# Patient Record
Sex: Female | Born: 1950 | Race: Black or African American | Hispanic: No | Marital: Single | Smoking: Former smoker
Health system: Southern US, Community
[De-identification: ages and names within clinical notes are randomized; demographics above are authoritative.]

## PROBLEM LIST (undated history)

## (undated) ENCOUNTER — Ambulatory Visit: Admission: EM | Payer: Medicare Other | Source: Home / Self Care

## (undated) DIAGNOSIS — Z8709 Personal history of other diseases of the respiratory system: Secondary | ICD-10-CM

## (undated) DIAGNOSIS — Z96651 Presence of right artificial knee joint: Secondary | ICD-10-CM

## (undated) DIAGNOSIS — I10 Essential (primary) hypertension: Secondary | ICD-10-CM

## (undated) DIAGNOSIS — Z96652 Presence of left artificial knee joint: Secondary | ICD-10-CM

## (undated) DIAGNOSIS — Z96659 Presence of unspecified artificial knee joint: Secondary | ICD-10-CM

## (undated) DIAGNOSIS — I493 Ventricular premature depolarization: Secondary | ICD-10-CM

## (undated) DIAGNOSIS — M797 Fibromyalgia: Secondary | ICD-10-CM

## (undated) DIAGNOSIS — Z9884 Bariatric surgery status: Secondary | ICD-10-CM

## (undated) DIAGNOSIS — K219 Gastro-esophageal reflux disease without esophagitis: Secondary | ICD-10-CM

## (undated) DIAGNOSIS — J309 Allergic rhinitis, unspecified: Secondary | ICD-10-CM

## (undated) DIAGNOSIS — I509 Heart failure, unspecified: Secondary | ICD-10-CM

## (undated) DIAGNOSIS — R079 Chest pain, unspecified: Secondary | ICD-10-CM

## (undated) DIAGNOSIS — R42 Dizziness and giddiness: Secondary | ICD-10-CM

## (undated) DIAGNOSIS — M7052 Other bursitis of knee, left knee: Secondary | ICD-10-CM

## (undated) DIAGNOSIS — M48061 Spinal stenosis, lumbar region without neurogenic claudication: Secondary | ICD-10-CM

## (undated) DIAGNOSIS — Z9109 Other allergy status, other than to drugs and biological substances: Secondary | ICD-10-CM

## (undated) DIAGNOSIS — E785 Hyperlipidemia, unspecified: Secondary | ICD-10-CM

## (undated) DIAGNOSIS — Z6841 Body Mass Index (BMI) 40.0 and over, adult: Secondary | ICD-10-CM

## (undated) DIAGNOSIS — R519 Headache, unspecified: Secondary | ICD-10-CM

## (undated) DIAGNOSIS — T4145XA Adverse effect of unspecified anesthetic, initial encounter: Secondary | ICD-10-CM

## (undated) DIAGNOSIS — I428 Other cardiomyopathies: Secondary | ICD-10-CM

## (undated) DIAGNOSIS — J45909 Unspecified asthma, uncomplicated: Secondary | ICD-10-CM

## (undated) DIAGNOSIS — M1990 Unspecified osteoarthritis, unspecified site: Secondary | ICD-10-CM

## (undated) DIAGNOSIS — K649 Unspecified hemorrhoids: Secondary | ICD-10-CM

## (undated) DIAGNOSIS — J449 Chronic obstructive pulmonary disease, unspecified: Secondary | ICD-10-CM

## (undated) DIAGNOSIS — R200 Anesthesia of skin: Secondary | ICD-10-CM

## (undated) DIAGNOSIS — M1712 Unilateral primary osteoarthritis, left knee: Secondary | ICD-10-CM

## (undated) DIAGNOSIS — H9319 Tinnitus, unspecified ear: Secondary | ICD-10-CM

## (undated) DIAGNOSIS — M1711 Unilateral primary osteoarthritis, right knee: Secondary | ICD-10-CM

## (undated) DIAGNOSIS — T8859XA Other complications of anesthesia, initial encounter: Secondary | ICD-10-CM

## (undated) DIAGNOSIS — G4730 Sleep apnea, unspecified: Secondary | ICD-10-CM

## (undated) HISTORY — DX: Other cardiomyopathies: I42.8

## (undated) HISTORY — PX: COLONOSCOPY: SHX174

## (undated) HISTORY — DX: Presence of left artificial knee joint: Z96.652

## (undated) HISTORY — DX: Heart failure, unspecified: I50.9

## (undated) HISTORY — DX: Presence of unspecified artificial knee joint: Z96.659

## (undated) HISTORY — PX: TONSILLECTOMY: SUR1361

## (undated) HISTORY — DX: Ventricular premature depolarization: I49.3

## (undated) HISTORY — DX: Chest pain, unspecified: R07.9

## (undated) HISTORY — PX: FRACTURE SURGERY: SHX138

## (undated) HISTORY — PX: GANGLION CYST EXCISION: SHX1691

## (undated) HISTORY — DX: Unilateral primary osteoarthritis, right knee: M17.11

## (undated) HISTORY — PX: OTHER SURGICAL HISTORY: SHX169

## (undated) HISTORY — DX: Body Mass Index (BMI) 40.0 and over, adult: Z684

## (undated) HISTORY — PX: NECK SURGERY: SHX720

## (undated) HISTORY — PX: GASTRIC BYPASS OPEN: SUR638

## (undated) HISTORY — DX: Other bursitis of knee, left knee: M70.52

## (undated) HISTORY — PX: COLON SURGERY: SHX602

## (undated) HISTORY — DX: Spinal stenosis, lumbar region without neurogenic claudication: M48.061

## (undated) HISTORY — DX: Hyperlipidemia, unspecified: E78.5

## (undated) HISTORY — DX: Morbid (severe) obesity due to excess calories: E66.01

## (undated) HISTORY — PX: ABDOMINAL HYSTERECTOMY: SHX81

## (undated) HISTORY — DX: Unilateral primary osteoarthritis, left knee: M17.12

## (undated) HISTORY — PX: KNEE SURGERY: SHX244

## (undated) HISTORY — DX: Presence of right artificial knee joint: Z96.651

## (undated) HISTORY — PX: CHOLECYSTECTOMY: SHX55

---

## 1977-01-12 HISTORY — PX: ABDOMINAL HYSTERECTOMY: SUR658

## 1978-01-12 HISTORY — PX: GASTRIC BYPASS: SHX52

## 2000-03-16 ENCOUNTER — Ambulatory Visit (HOSPITAL_BASED_OUTPATIENT_CLINIC_OR_DEPARTMENT_OTHER): Admission: RE | Admit: 2000-03-16 | Discharge: 2000-03-16 | Payer: Self-pay | Admitting: Orthopaedic Surgery

## 2006-11-28 ENCOUNTER — Ambulatory Visit: Payer: Self-pay | Admitting: Internal Medicine

## 2006-12-30 ENCOUNTER — Ambulatory Visit: Payer: Self-pay | Admitting: Family Medicine

## 2007-03-22 ENCOUNTER — Ambulatory Visit (HOSPITAL_COMMUNITY): Admission: RE | Admit: 2007-03-22 | Discharge: 2007-03-22 | Payer: Self-pay | Admitting: Orthopaedic Surgery

## 2007-07-12 ENCOUNTER — Ambulatory Visit: Payer: Self-pay | Admitting: Gastroenterology

## 2007-08-11 ENCOUNTER — Ambulatory Visit: Payer: Self-pay | Admitting: Gastroenterology

## 2007-10-27 ENCOUNTER — Ambulatory Visit: Payer: Self-pay | Admitting: Internal Medicine

## 2008-02-07 ENCOUNTER — Ambulatory Visit: Payer: Self-pay | Admitting: Family Medicine

## 2009-02-22 ENCOUNTER — Ambulatory Visit: Payer: Self-pay | Admitting: Internal Medicine

## 2009-05-16 ENCOUNTER — Ambulatory Visit: Payer: Self-pay | Admitting: Internal Medicine

## 2010-02-17 ENCOUNTER — Ambulatory Visit: Payer: Self-pay | Admitting: Family Medicine

## 2010-05-27 NOTE — Op Note (Signed)
NAME:  Wendy Sanchez, Wendy Sanchez               ACCOUNT NO.:  [id number]   MEDICAL RECORD NO.:  [id number]          PATIENT TYPE:  AMB   LOCATION:  SDS                          FACILITY:  [facility]   PHYSICIAN:  Lubertha Basque. Dalldorf, M.D.DATE OF BIRTH:  [birth year]   DATE OF PROCEDURE:  03/22/2007  DATE OF DISCHARGE:                               OPERATIVE REPORT   PREOPERATIVE DIAGNOSES:  1. Right shoulder impingement.  2. Right shoulder rotator cuff tear.   POSTOPERATIVE DIAGNOSES:  1. Right shoulder impingement.  2. Right shoulder rotator cuff tear.   PROCEDURE:  1. Right shoulder arthroscopic acromioplasty.  2. Right shoulder partial clavicectomy.  3. Right shoulder arthroscopic rotator cuff repair.   ANESTHESIA:  General.   ATTENDING SURGEON:  Lubertha Basque. Jerl Santos, M.D.   ASSISTANT:  Lindwood Qua, P.A.   INDICATIONS FOR PROCEDURE:  The patient is a 60 year old woman whom I  have followed for several years with intense right shoulder pain.  This  has persisted despite oral anti-inflammatories, exercise program and  several injections.  Based on MRI scan which is now two years old she  has a minimally retracted rotator cuff tear.  She has pain which limits  her ability to use her arm and to rest and she is offered an arthroscopy  with repair.  Informed operative consent was obtained after discussion  of possible complications of reaction to anesthesia and infection.   SUMMARY OF FINDINGS AND PROCEDURE:  Under general anesthesia an  arthroscopy of the right shoulder was performed.  Glenohumeral joint  showed no degenerative changes and the biceps tendon was intact.  She  had a large rotator cuff tear seen from below.  In the subacromial space  her large cuff tear was confirmed and measured about 1.5 cm in size  though it was minimally retracted.  She did have a prominent subacromial  morphology addressed with acromioplasty and also had a prominence of the  distal AC joint addressed with a  partial clavicectomy.  We then repaired  the rotator cuff to bleeding bed of bone using two of the push lock  anchors by Arthrex using reverse mattress sutures of #2 FiberWire with  each.  [name] assisted throughout and was invaluable to the  completion of the case in that he helped position and retract and pass  instruments while I performed procedure.   DESCRIPTION OF PROCEDURE:  The patient was taken to an operating suite  where general anesthetic applied without difficulty.  Main OR was  utilized due to size of the patient.  She was positioned in beach-chair  position and prepped and draped in normal sterile fashion.  Great care  was taken with positioning and padding reflective of the size of the  patient.  After administration of preop IV Kefzol, an arthroscopy right  shoulder was performed through a total of three portals.  Findings were  as noted above and procedure consisted of acromioplasty and partial  clavicectomy done with the bur in the lateral position followed by  transfer of the bur to the posterior position.  Then debrided her cuff  and  the tissues did appear to be fairly good.  I created bleeding bed of  bone with the bur directly underneath this minimally retracted tear.  We  then passed two mattress sutures of #2 FiberWire with the scorpion  device.  I then stabilized the cuff to the bone using these sutures and  the large push locks by Arthrex.  This seemed to reapproximate things  very well with a nice stable repair.  The shoulder was thoroughly  irrigated followed by placement Marcaine with epinephrine morphine.  Simple sutures of nylon was used to loosely reapproximate the portals  followed by Adaptic and dry gauze dressing with tape.  Estimated blood  and intraoperative fluids can be obtained from anesthesia records.   DISPOSITION:  The patient was extubated in the operating room and taken  to recovery room in stable addition.  She was to go home the same  day  and follow up in the office in less than a week.  I will contact her by  phone tonight.      Lubertha Basque Jerl Santos, M.D.  Electronically Signed     PGD/MEDQ  D:  03/22/2007  T:  03/23/2007  Job:  [id number]

## 2010-05-30 NOTE — Op Note (Signed)
[place]  Patient:    ALA, Wendy Sanchez                      MRN: [id number] Proc. Date: 03/16/00 Adm. Date:  [id number] Attending:  Marcene Corning                           Operative Report  PREOPERATIVE DIAGNOSES: 1. Torn medial and torn lateral meniscus. 2. Degenerative joint disease.  POSTOPERATIVE DIAGNOSES: 1. Torn medial and torn lateral meniscus. 2. Degenerative joint disease.  PROCEDURES: 1. Right knee partial medial and partial lateral meniscectomies. 2. Right knee chondroplasty, patellofemoral joint.  ANESTHESIA:  General.  SURGEON:  Lubertha Basque. Jerl Santos, M.D.  ASSISTANT:  Prince Rome, P.Sanchez.  INDICATION FOR PROCEDURE:  The patient is Sanchez 60 year old woman with many months of significant left knee pain since Sanchez work-related injury.  This has persisted despite oral anti-inflammatories and rest.  At this point she is offered an arthroscopy.  The procedure was discussed with the patient, and informed operative consent was obtained after discussion of possible complications of, reaction to anesthesia, and infection.  DESCRIPTION OF PROCEDURE:  The patient was taken to the operating suite, where Sanchez general anesthetic was applied without difficulty.  She was positioned supine and prepped and draped in the normal sterile fashion.  We used Sanchez Hibiclens prep, as she expressed an allergy to iodine.  She was then draped in standard fashion.  After administration of preoperative IV antibiotics, an arthroscopy of the left knee was performed through two inferior portals.  The suprapatellar pouch was benign, while the patellofemoral joint for the most part was benign except for Sanchez grade 4 area of change in the medial aspect of the groove.  This required Sanchez thorough chondroplasty of this portion of the knee.  In the medial compartment, she had Sanchez degenerative tear of the middle horn, which was addressed with Sanchez partial medial meniscectomy, taking  about 10% of this structure back to Sanchez stable rim.  She had some grade 2 change on the femoral condyle.  ACL and PCL were intact.  In the lateral compartment, she had again Sanchez degenerative tear involving at this time the posterior horn, and this was addressed with Sanchez partial lateral meniscectomy, taking about 5% of this structure back to Sanchez stable rim.  There were again just grade 2 changes in the lateral compartment.  The knee was thoroughly irrigated at the end of the case, followed by the placement of Marcaine with epinephrine and morphine. Adaptic was placed over the two portals, followed by dry gauze and Sanchez loose Ace wrap.  Estimated blood loss and intraoperative fluids can be obtained from anesthesia records.  DISPOSITION:  The patient was extubated in the operating room and taken to the recovery room in stable condition.  Plans were for her to go home the same day and to follow up in our office in less than Sanchez week.  I will contact her by phone tonight. DD:  03/16/00 TD:  03/16/00 Job: [id number]

## 2010-10-06 LAB — CBC
HCT: 37.7
Hemoglobin: 12.3
MCV: 85.2
Platelets: 348
RBC: 4.43
WBC: 5.1

## 2010-10-06 LAB — BASIC METABOLIC PANEL
Chloride: 105
GFR calc non Af Amer: >60
Potassium: 3.7
Sodium: 138

## 2010-10-06 LAB — LIPID PANEL
HDL: 71
Total CHOL/HDL Ratio: 3.3
Triglycerides: 76
VLDL: 15

## 2011-03-26 ENCOUNTER — Ambulatory Visit: Payer: Self-pay | Admitting: Family Medicine

## 2012-10-03 ENCOUNTER — Ambulatory Visit: Payer: Self-pay | Admitting: Family Medicine

## 2013-02-23 ENCOUNTER — Ambulatory Visit: Payer: Self-pay | Admitting: Family Medicine

## 2013-04-18 ENCOUNTER — Ambulatory Visit: Payer: Self-pay | Admitting: Gastroenterology

## 2013-04-18 LAB — CLOSTRIDIUM DIFFICILE(ARMC)

## 2013-04-20 LAB — PATHOLOGY REPORT

## 2013-04-20 LAB — STOOL CULTURE

## 2013-04-28 ENCOUNTER — Other Ambulatory Visit: Payer: Self-pay | Admitting: Gastroenterology

## 2013-04-28 LAB — CLOSTRIDIUM DIFFICILE(ARMC)

## 2013-04-30 LAB — STOOL CULTURE

## 2013-05-30 ENCOUNTER — Ambulatory Visit: Payer: Self-pay | Admitting: Gastroenterology

## 2013-07-12 ENCOUNTER — Ambulatory Visit: Payer: Self-pay | Admitting: Rheumatology

## 2013-08-08 ENCOUNTER — Ambulatory Visit: Payer: Self-pay | Admitting: Rheumatology

## 2013-08-28 ENCOUNTER — Other Ambulatory Visit (HOSPITAL_BASED_OUTPATIENT_CLINIC_OR_DEPARTMENT_OTHER): Payer: Self-pay | Admitting: Rheumatology

## 2013-08-28 DIAGNOSIS — C642 Malignant neoplasm of left kidney, except renal pelvis: Secondary | ICD-10-CM

## 2013-08-30 ENCOUNTER — Other Ambulatory Visit: Payer: Self-pay

## 2013-09-02 ENCOUNTER — Other Ambulatory Visit (HOSPITAL_BASED_OUTPATIENT_CLINIC_OR_DEPARTMENT_OTHER): Payer: Medicare Other

## 2013-09-02 ENCOUNTER — Ambulatory Visit (HOSPITAL_BASED_OUTPATIENT_CLINIC_OR_DEPARTMENT_OTHER)
Admission: RE | Admit: 2013-09-02 | Discharge: 2013-09-02 | Disposition: A | Discharge status: Home / Self Care | Payer: Medicare Other | Source: Ambulatory Visit | Attending: Rheumatology | Admitting: Rheumatology

## 2013-09-02 DIAGNOSIS — Q619 Cystic kidney disease, unspecified: Secondary | ICD-10-CM | POA: Insufficient documentation

## 2013-09-02 DIAGNOSIS — C649 Malignant neoplasm of unspecified kidney, except renal pelvis: Secondary | ICD-10-CM | POA: Insufficient documentation

## 2013-09-02 DIAGNOSIS — C642 Malignant neoplasm of left kidney, except renal pelvis: Secondary | ICD-10-CM

## 2013-09-02 MED ORDER — GADOBENATE DIMEGLUMINE 529 MG/ML IV SOLN: 20.0000 mL | Freq: Once | INTRAVENOUS | Status: AC | PRN

## 2013-10-13 ENCOUNTER — Ambulatory Visit: Payer: Self-pay | Admitting: Family Medicine

## 2013-11-16 ENCOUNTER — Ambulatory Visit: Payer: Self-pay | Admitting: Gastroenterology

## 2013-11-16 LAB — CLOSTRIDIUM DIFFICILE(ARMC)

## 2013-11-18 LAB — STOOL CULTURE

## 2014-06-10 ENCOUNTER — Ambulatory Visit
Admission: EM | Admit: 2014-06-10 | Discharge: 2014-06-10 | Disposition: A | Discharge status: Home / Self Care | Payer: Medicare Other | Attending: Family Medicine | Admitting: Family Medicine

## 2014-06-10 DIAGNOSIS — J209 Acute bronchitis, unspecified: Secondary | ICD-10-CM | POA: Diagnosis not present

## 2014-06-10 DIAGNOSIS — I10 Essential (primary) hypertension: Secondary | ICD-10-CM | POA: Diagnosis not present

## 2014-06-10 DIAGNOSIS — R3915 Urgency of urination: Secondary | ICD-10-CM | POA: Diagnosis present

## 2014-06-10 DIAGNOSIS — S39011A Strain of muscle, fascia and tendon of abdomen, initial encounter: Secondary | ICD-10-CM | POA: Diagnosis not present

## 2014-06-10 DIAGNOSIS — K219 Gastro-esophageal reflux disease without esophagitis: Secondary | ICD-10-CM | POA: Diagnosis not present

## 2014-06-10 HISTORY — DX: Gastro-esophageal reflux disease without esophagitis: K21.9

## 2014-06-10 HISTORY — DX: Essential (primary) hypertension: I10

## 2014-06-10 HISTORY — DX: Allergic rhinitis, unspecified: J30.9

## 2014-06-10 HISTORY — DX: Unspecified osteoarthritis, unspecified site: M19.90

## 2014-06-10 LAB — URINALYSIS COMPLETE WITH MICROSCOPIC (ARMC ONLY)
BACTERIA UA: NONE SEEN — AB
BILIRUBIN URINE: NEGATIVE
Glucose, UA: NEGATIVE mg/dL
KETONES UR: NEGATIVE mg/dL
LEUKOCYTES UA: NEGATIVE
NITRITE: NEGATIVE
PROTEIN: NEGATIVE mg/dL
Specific Gravity, Urine: 1.015 (ref 1.005–1.030)
pH: 6.5 (ref 5.0–8.0)

## 2014-06-10 MED ORDER — AZITHROMYCIN 250 MG PO TABS: ORAL_TABLET | ORAL | Status: DC

## 2014-06-10 MED ORDER — FLUCONAZOLE 150 MG PO TABS: 150.0000 mg | ORAL_TABLET | Freq: Every day | ORAL | Status: DC

## 2014-06-10 NOTE — ED Provider Notes (Signed)
CSN: [id number]     Arrival date & time 06/10/14  4098 History   First MD Initiated Contact with Patient 06/10/14 1141     Chief Complaint  Patient presents with  . Urinary Urgency   (Consider location/radiation/quality/duration/timing/severity/associated sxs/prior Treatment) HPI Comments: 64 yo with a 2-3 days h/o left lower quadrant abdominal pain, urinary frequency and urgency. Denies any fevers, chills, vomiting, diarrhea. Also complains of a cough and slight wheezing.   The history is provided by the patient.    Past Medical History  Diagnosis Date  . Hypertension   . GERD (gastroesophageal reflux disease)   . Arthritis   . Allergic rhinitis    Past Surgical History  Procedure Laterality Date  . Abdominal hysterectomy  1979    left ovary remains  . Ganglion cyst excision     Family History  Problem Relation Age of Onset  . Hypertension Father   . Dementia Mother   . Deep vein thrombosis Father   . Diabetes Sister   . Congestive Heart Failure Sister   . Congestive Heart Failure Brother   . Congestive Heart Failure Daughter   . Congestive Heart Failure Son    History  Substance Use Topics  . Smoking status: Current Every Day Smoker -- 0.50 packs/day for 30 years    Types: Cigarettes  . Smokeless tobacco: Not on file  . Alcohol Use: No   OB History    No data available     Review of Systems  Allergies  Iodine  Home Medications   Prior to Admission medications   Medication Sig Start Date End Date Taking? Authorizing Provider  azelastine (ASTELIN) 0.1 % nasal spray Place into both nostrils 2 (two) times daily. Use in each nostril as directed   Yes Historical Provider, MD  cholecalciferol (VITAMIN D) 1000 UNITS tablet Take 1,000 Units by mouth daily.   Yes Historical Provider, MD  dexlansoprazole (DEXILANT) 60 MG capsule Take 60 mg by mouth daily.   Yes Historical Provider, MD  diclofenac sodium (VOLTAREN) 1 % GEL Apply topically 4 (four) times daily.   Yes  Historical Provider, MD  fexofenadine (ALLEGRA) 180 MG tablet Take 180 mg by mouth daily.   Yes Historical Provider, MD  Fluticasone-Salmeterol (ADVAIR) 250-50 MCG/DOSE AEPB Inhale 1 puff into the lungs 2 (two) times daily.   Yes Historical Provider, MD  folic acid (FOLVITE) 1 MG tablet Take 1 mg by mouth daily.   Yes Historical Provider, MD  furosemide (LASIX) 40 MG tablet Take 40 mg by mouth.   Yes Historical Provider, MD  ibuprofen (ADVIL,MOTRIN) 800 MG tablet Take 800 mg by mouth every 8 (eight) hours as needed.   Yes Historical Provider, MD  mirabegron ER (MYRBETRIQ) 50 MG TB24 tablet Take 50 mg by mouth daily.   Yes Historical Provider, MD  montelukast (SINGULAIR) 10 MG tablet Take 10 mg by mouth at bedtime.   Yes Historical Provider, MD  oxycodone-acetaminophen (LYNOX) 5-300 MG per tablet Take 1 tablet by mouth every 8 (eight) hours as needed for pain.   Yes Historical Provider, MD  potassium chloride SA (K-DUR,KLOR-CON) 20 MEQ tablet Take 20 mEq by mouth 2 (two) times daily.   Yes Historical Provider, MD  ramipril (ALTACE) 10 MG capsule Take 10 mg by mouth 2 (two) times daily.   Yes Historical Provider, MD  sucralfate (CARAFATE) 1 G tablet Take 1 g by mouth 2 (two) times daily.   Yes Historical Provider, MD  traMADol (ULTRAM) 50 MG tablet  Take 50 mg by mouth every 6 (six) hours as needed.   Yes Historical Provider, MD  azithromycin (ZITHROMAX) 250 MG tablet Take first 2 tablets together, then 1 every day until finished. 06/10/14   Norval Gable, MD  fluconazole (DIFLUCAN) 150 MG tablet Take 1 tablet (150 mg total) by mouth daily. 06/10/14   Norval Gable, MD   BP 160/93 mmHg  Pulse 78  Temp(Src) 98.3 F (36.8 C) (Oral)  Resp 18  Ht 5\' 8"  (1.727 m)  Wt 319 lb (144.697 kg)  BMI 48.51 kg/m2  SpO2 100% Physical Exam  Constitutional: She appears well-developed and well-nourished. No distress.  HENT:  Head: Normocephalic.  Right Ear: Tympanic membrane, external ear and ear canal normal.   Left Ear: Tympanic membrane, external ear and ear canal normal.  Nose: Nose normal.  Mouth/Throat: Oropharynx is clear and moist and mucous membranes are normal.  Eyes: Conjunctivae and EOM are normal. Pupils are equal, round, and reactive to light. Right eye exhibits no discharge. Left eye exhibits no discharge. No scleral icterus.  Neck: Normal range of motion. Neck supple. No JVD present. No tracheal deviation present. No thyromegaly present.  Cardiovascular: Normal rate, regular rhythm, normal heart sounds and intact distal pulses.   No murmur heard. Pulmonary/Chest: Effort normal. No stridor. No respiratory distress. She has wheezes (mild diffuse expiratory wheezes and rhonchi). She has no rales. She exhibits no tenderness.  Abdominal: Soft. Bowel sounds are normal. She exhibits no distension and no mass. There is tenderness (mild left groin pinpoint tenderness to palpation). There is no rebound and no guarding.  Musculoskeletal: She exhibits no edema or tenderness.  Lymphadenopathy:    She has no cervical adenopathy.  Neurological: She is alert.  Skin: Skin is warm. No rash noted. She is not diaphoretic.  Vitals reviewed.   ED Course  Procedures (including critical care time) Labs Review Labs Reviewed  URINALYSIS COMPLETEWITH MICROSCOPIC ([place]) - Abnormal; Notable for the following:    Color, Urine STRAW (*)    Hgb urine dipstick TRACE (*)    Bacteria, UA NONE SEEN (*)    Squamous Epithelial / LPF 0-5 (*)    All other components within normal limits  URINE CULTURE    Imaging Review No results found.   MDM   1. Bronchospasm with bronchitis, acute   2. Abdominal muscle strain, initial encounter    Discharge Medication List as of 06/10/2014 12:01 PM    START taking these medications   Details  azithromycin (ZITHROMAX) 250 MG tablet Take first 2 tablets together, then 1 every day until finished., Normal    fluconazole (DIFLUCAN) 150 MG tablet Take 1 tablet (150  mg total) by mouth daily., Starting 06/10/2014, Until Discontinued, Normal      Plan: 1. Diagnosis reviewed with patient 2. rx as per orders; risks, benefits, potential side effects reviewed with patient 3. Recommend supportive treatment with increased fluids, otc anagesics 4. F/u prn if symptoms worsen or don't improve    Norval Gable, MD 06/12/14 [phone]

## 2014-06-10 NOTE — ED Notes (Signed)
Patient states that she has lower abdominal pain with Urinary Urgency and frequency. She states that pain is worse when she tries to hold her urine. She states that she also has a cough.

## 2014-06-11 LAB — URINE CULTURE: SPECIAL REQUESTS: NORMAL

## 2014-06-23 ENCOUNTER — Ambulatory Visit
Admission: EM | Admit: 2014-06-23 | Discharge: 2014-06-23 | Disposition: A | Discharge status: Home / Self Care | Payer: Medicare Other | Attending: Emergency Medicine | Admitting: Emergency Medicine

## 2014-06-23 ENCOUNTER — Other Ambulatory Visit: Payer: Self-pay

## 2014-06-23 DIAGNOSIS — K219 Gastro-esophageal reflux disease without esophagitis: Secondary | ICD-10-CM | POA: Insufficient documentation

## 2014-06-23 DIAGNOSIS — F17210 Nicotine dependence, cigarettes, uncomplicated: Secondary | ICD-10-CM | POA: Diagnosis not present

## 2014-06-23 DIAGNOSIS — I10 Essential (primary) hypertension: Secondary | ICD-10-CM | POA: Insufficient documentation

## 2014-06-23 DIAGNOSIS — J0100 Acute maxillary sinusitis, unspecified: Secondary | ICD-10-CM | POA: Diagnosis not present

## 2014-06-23 DIAGNOSIS — R42 Dizziness and giddiness: Secondary | ICD-10-CM | POA: Diagnosis not present

## 2014-06-23 MED ORDER — MECLIZINE HCL 25 MG PO TABS: 25.0000 mg | ORAL_TABLET | Freq: Four times a day (QID) | ORAL | Status: DC

## 2014-06-23 MED ORDER — AMOXICILLIN-POT CLAVULANATE 875-125 MG PO TABS: 1.0000 | ORAL_TABLET | Freq: Two times a day (BID) | ORAL | Status: DC

## 2014-06-23 NOTE — ED Notes (Addendum)
Tenderness where ganglion cysts removal last week. Speech WNL. Coughing in triage and reports chest feels tight. Grips equal. No palmar drift, DPF strong and equal. No facial asymmetry. PERRL.

## 2014-06-23 NOTE — ED Provider Notes (Addendum)
HPI  SUBJECTIVE:  Wendy Sanchez is a 64 y.o. female who presents with 4 episodes of dizziness described as vertigo starting last night. States that last seconds to minutes. She states it happens when she turns her head lying down, with large positional changes. It is better when she shuts her eyes. She has not tried anything for this. She was recently treated for an upper respiratory infection with azithromycin. States that she is still having some coughing and wheezing, no chest pain or shortness of breath. She also reports some ear fullness/ear pain. Denies tinnitus, change in hearing. She denies visual changes, arm or leg weakness, dysarthria, extremity numbness or tingling, facial droop, headache. Past medical history hypertension, arthritis, asthma, no past medical history diabetes, stroke, arrhythmia.   Patient also reports continuing nasal congestion, sinus pain/pressure that has been present for over 3 weeks. States that it did not respond to the azithromycin. She takes Astelin and Allegra on a regular basis.     Past Medical History  Diagnosis Date  . Hypertension   . GERD (gastroesophageal reflux disease)   . Arthritis   . Allergic rhinitis     Past Surgical History  Procedure Laterality Date  . Abdominal hysterectomy  1979    left ovary remains  . Ganglion cyst excision      Family History  Problem Relation Age of Onset  . Hypertension Father   . Dementia Mother   . Deep vein thrombosis Father   . Diabetes Sister   . Congestive Heart Failure Sister   . Congestive Heart Failure Brother   . Congestive Heart Failure Daughter   . Congestive Heart Failure Son     History  Substance Use Topics  . Smoking status: Current Every Day Smoker -- 0.50 packs/day for 30 years    Types: Cigarettes  . Smokeless tobacco: Not on file  . Alcohol Use: No    No current facility-administered medications for this encounter.  Current outpatient prescriptions:  .  azelastine  (ASTELIN) 0.1 % nasal spray, Place into both nostrils 2 (two) times daily. Use in each nostril as directed, Disp: , Rfl:  .  cholecalciferol (VITAMIN D) 1000 UNITS tablet, Take 1,000 Units by mouth daily., Disp: , Rfl:  .  dexlansoprazole (DEXILANT) 60 MG capsule, Take 60 mg by mouth daily., Disp: , Rfl:  .  diclofenac sodium (VOLTAREN) 1 % GEL, Apply topically 4 (four) times daily., Disp: , Rfl:  .  fluconazole (DIFLUCAN) 150 MG tablet, Take 1 tablet (150 mg total) by mouth daily., Disp: 1 tablet, Rfl: 2 .  Fluticasone-Salmeterol (ADVAIR) 250-50 MCG/DOSE AEPB, Inhale 1 puff into the lungs 2 (two) times daily., Disp: , Rfl:  .  folic acid (FOLVITE) 1 MG tablet, Take 1 mg by mouth daily., Disp: , Rfl:  .  furosemide (LASIX) 40 MG tablet, Take 40 mg by mouth., Disp: , Rfl:  .  ibuprofen (ADVIL,MOTRIN) 800 MG tablet, Take 800 mg by mouth every 8 (eight) hours as needed., Disp: , Rfl:  .  mirabegron ER (MYRBETRIQ) 50 MG TB24 tablet, Take 50 mg by mouth daily., Disp: , Rfl:  .  montelukast (SINGULAIR) 10 MG tablet, Take 10 mg by mouth at bedtime., Disp: , Rfl:  .  oxycodone-acetaminophen (LYNOX) 5-300 MG per tablet, Take 1 tablet by mouth every 8 (eight) hours as needed for pain., Disp: , Rfl:  .  potassium chloride SA (K-DUR,KLOR-CON) 20 MEQ tablet, Take 20 mEq by mouth 2 (two) times daily., Disp: ,  Rfl:  .  ramipril (ALTACE) 10 MG capsule, Take 10 mg by mouth 2 (two) times daily., Disp: , Rfl:  .  sucralfate (CARAFATE) 1 G tablet, Take 1 g by mouth 2 (two) times daily., Disp: , Rfl:  .  traMADol (ULTRAM) 50 MG tablet, Take 50 mg by mouth every 6 (six) hours as needed., Disp: , Rfl:  .  amoxicillin-clavulanate (AUGMENTIN) 875-125 MG per tablet, Take 1 tablet by mouth 2 (two) times daily. X 7 days, Disp: 14 tablet, Rfl: 0 .  meclizine (ANTIVERT) 25 MG tablet, Take 1 tablet (25 mg total) by mouth 4 (four) times daily., Disp: 28 tablet, Rfl: 0 .  [DISCONTINUED] fexofenadine (ALLEGRA) 180 MG tablet, Take  180 mg by mouth daily., Disp: , Rfl:   Allergies  Allergen Reactions  . Iodine      ROS  As noted in HPI.   Physical Exam  BP 151/94 mmHg  Pulse 86  Temp(Src) 98.4 F (36.9 C) (Oral)  Ht 5\' 8"  (1.727 m)  Wt 320 lb (145.151 kg)  BMI 48.67 kg/m2  SpO2 100%  Constitutional: Well developed, well nourished, no acute distress Eyes: PERRL, EOMI, conjunctiva normal bilaterally HENT: Normocephalic, atraumatic,mucus membranes moist. Mild nasal congestion. Bilateral maxillary sinus tenderness. Small amount of air fluid levels behind the right TM. Otherwise normal color no dullness no redness no bulging. Hearing normal bilaterally. Respiratory: Good inspiratory effort good air movement occasional wheezing, no rales, no rhonchi Cardiovascular: Normal rate and rhythm, no murmurs, no gallops, no rubs. No carotid bruit GI: Soft, nondistended, normal bowel sounds, nontender, no rebound, no guarding Back: no CVAT skin: No rash, skin intact Musculoskeletal: No edema, no tenderness, no deformities Neurologic: Alert & oriented x 3, CN II-XII intact, finger-nose, heel shin within normal limits tendon gait steady Romberg negative. Dix-Hallpike negative, no nystagmus. Height negative bilaterally. Psychiatric: Speech and behavior appropriate   ED Course   Medications - No data to display  Orders Placed This Encounter  Procedures  . ED EKG    Standing Status: Standing     Number of Occurrences: 1     Standing Expiration Date:     Order Specific Question:  Reason for Exam    Answer:  Chest Pain   No results found for this or any previous visit (from the past 24 hour(s)). No results found.  ED Clinical Impression  Vertigo  Acute maxillary sinusitis, recurrence not specified  ED Assessment/Plan  EKG: Normal sinus rhythm, rate 78, normal axis, normal intervals. LVH. Isolated Q-wave in lead 3. No ST elevation or other ST-T wave changes. No previous EKG for comparison  story is most  consistent with a peripheral vertigo, although was unable to elicit symptoms on exam. exam also consistent with a sinusitis.. Patient does not appear dehydrated, has had no nausea vomiting or other reason to be dehydrated. No evidence of stroke on H&P. Patient does have some wheezing, but she has a history of asthma, suspect that this wheezing and coughing is left over from the recent URI/bronchitis. She is not using her albuterol or Advair on a regular basis advised her to restart this to help with the cough.  We'll treat the peripheral vertigo with meclizine gave patient information on the Epley maneuver, will and also treat the sinusitis with some Augmentin. We'll have her continue Astelin and advised her to start taking regular Mucinex. Augmentin will also treat any serous otitis. Patient to follow up with primary care physician in several days. Had extensive discussion  of signs and symptoms that prompt return to the ER. Patient agrees with plan.  Discussed sn/sx that should prompt return to the UC or ED. Patient agrees with plan.  *This clinic note was created using Dragon dictation software. Therefore, there may be occasional mistakes despite careful proofreading.  ?   Melynda Ripple, MD 06/23/14 1714  Melynda Ripple, MD 06/23/14 1718

## 2014-06-23 NOTE — Discharge Instructions (Signed)
Stop the Allegra. Start some regular Mucinex. Continue the Astelin. Continue saline nasal irrigation. If her vertigo comes back, he may try the meclizine. There is also something called Epley maneuver which will also treat vertigo. Go to the ER for any signs of stroke, such as trouble talking, trouble walking, arm or leg weakness.

## 2014-09-13 ENCOUNTER — Other Ambulatory Visit: Payer: Self-pay | Admitting: Family Medicine

## 2014-09-13 DIAGNOSIS — Z1231 Encounter for screening mammogram for malignant neoplasm of breast: Secondary | ICD-10-CM

## 2014-09-28 ENCOUNTER — Other Ambulatory Visit: Payer: Self-pay | Admitting: Otolaryngology

## 2014-09-28 DIAGNOSIS — M5481 Occipital neuralgia: Secondary | ICD-10-CM

## 2014-09-28 DIAGNOSIS — R42 Dizziness and giddiness: Secondary | ICD-10-CM

## 2014-10-09 ENCOUNTER — Ambulatory Visit: Payer: Medicare Other | Attending: Anesthesiology | Admitting: Anesthesiology

## 2014-10-09 ENCOUNTER — Encounter: Payer: Self-pay | Admitting: Anesthesiology

## 2014-10-09 VITALS — BP 128/73 | HR 85 | Temp 98.6°F | Resp 16 | Ht 68.0 in | Wt 320.0 lb

## 2014-10-09 DIAGNOSIS — Z9884 Bariatric surgery status: Secondary | ICD-10-CM | POA: Diagnosis not present

## 2014-10-09 DIAGNOSIS — G4730 Sleep apnea, unspecified: Secondary | ICD-10-CM | POA: Insufficient documentation

## 2014-10-09 DIAGNOSIS — M1990 Unspecified osteoarthritis, unspecified site: Secondary | ICD-10-CM | POA: Diagnosis not present

## 2014-10-09 DIAGNOSIS — J45909 Unspecified asthma, uncomplicated: Secondary | ICD-10-CM | POA: Diagnosis not present

## 2014-10-09 DIAGNOSIS — M1712 Unilateral primary osteoarthritis, left knee: Secondary | ICD-10-CM

## 2014-10-09 DIAGNOSIS — R52 Pain, unspecified: Secondary | ICD-10-CM | POA: Insufficient documentation

## 2014-10-09 DIAGNOSIS — R1084 Generalized abdominal pain: Secondary | ICD-10-CM | POA: Insufficient documentation

## 2014-10-09 DIAGNOSIS — E669 Obesity, unspecified: Secondary | ICD-10-CM | POA: Insufficient documentation

## 2014-10-09 DIAGNOSIS — E785 Hyperlipidemia, unspecified: Secondary | ICD-10-CM | POA: Insufficient documentation

## 2014-10-09 DIAGNOSIS — F17210 Nicotine dependence, cigarettes, uncomplicated: Secondary | ICD-10-CM | POA: Diagnosis not present

## 2014-10-09 DIAGNOSIS — I10 Essential (primary) hypertension: Secondary | ICD-10-CM | POA: Insufficient documentation

## 2014-10-09 MED ORDER — HYDROCODONE-ACETAMINOPHEN 5-325 MG PO TABS: 1.0000 | ORAL_TABLET | Freq: Four times a day (QID) | ORAL | Status: DC | PRN

## 2014-10-09 NOTE — Patient Instructions (Signed)

## 2014-10-09 NOTE — Progress Notes (Signed)
CC:  Diffuse body pain  Procedure:  None  HPI:  This former wife is a pleasant 64 year old black female with long-standing history of diffuse body pain. He has a history of osteoarthritis and has been referred for evaluation secondary to requirements for chronic opioid management secondary to the recalcitrant nature and persistence of her pain. She currently describes pain in the bilateral shoulders neck low back hips and knees. At present she is scheduled for a knee replacement she reports in the near future. She has been taking Percocet 5 mg tablets to these at nighttime. She states that this is the only way that she can get any relief at night. She has been taking ibuprofen 800 mg 3 times a day but this has been discouraged because of the risk to her kidneys and for stomach ulceration. Furthermore she's also been tried on Ultram 50 mg tablets to these 3 times a day which gives her some minimal relief during the day. She reports that her pain is at a maximum VAS is up to 10 and generally is at least a 3-5 throughout the day. The pain is in the after mentioned regions. She has pain that keeps her up at night is described as cramping disabling sharp deep and extensive. Rest sometimes helps as does heat application however in general she is unable to get any significant relief area she denies any significant problems with bowel or bladder function but occasionally has some weakness going from seated to standing but she attributes this to the pain and her weight.  BP 128/73 mmHg  Pulse 85  Temp(Src) 98.6 F (37 C) (Oral)  Resp 16  Ht 5\' 8"  (1.727 m)  Wt 320 lb (145.151 kg)  BMI 48.67 kg/m2  SpO2 100%   Current outpatient prescriptions:  .  albuterol (PROVENTIL HFA;VENTOLIN HFA) 108 (90 BASE) MCG/ACT inhaler, Inhale 2 puffs into the lungs every 6 (six) hours as needed for wheezing or shortness of breath., Disp: , Rfl:  .  azelastine (ASTELIN) 0.1 % nasal spray, Place into both nostrils 2 (two) times  daily. Use in each nostril as directed, Disp: , Rfl:  .  clotrimazole (LOTRIMIN) 1 % cream, Apply 1 application topically 2 (two) times daily. prn, Disp: , Rfl:  .  cyclobenzaprine (FLEXERIL) 10 MG tablet, Take 10 mg by mouth 3 (three) times daily as needed for muscle spasms., Disp: , Rfl:  .  dexlansoprazole (DEXILANT) 60 MG capsule, Take 60 mg by mouth daily., Disp: , Rfl:  .  diclofenac sodium (VOLTAREN) 1 % GEL, Apply topically 4 (four) times daily., Disp: , Rfl:  .  fexofenadine (ALLEGRA) 180 MG tablet, Take 180 mg by mouth daily., Disp: , Rfl:  .  fluconazole (DIFLUCAN) 150 MG tablet, Take 1 tablet (150 mg total) by mouth daily., Disp: 1 tablet, Rfl: 2 .  Fluticasone-Salmeterol (ADVAIR) 250-50 MCG/DOSE AEPB, Inhale 1 puff into the lungs 2 (two) times daily., Disp: , Rfl:  .  folic acid (FOLVITE) 1 MG tablet, Take 1 mg by mouth daily., Disp: , Rfl:  .  furosemide (LASIX) 40 MG tablet, Take 40 mg by mouth., Disp: , Rfl:  .  gabapentin (NEURONTIN) 300 MG capsule, Take 300 mg by mouth 2 (two) times daily. Takes two capsules twice a day, Disp: , Rfl:  .  ibuprofen (ADVIL,MOTRIN) 800 MG tablet, Take 800 mg by mouth every 8 (eight) hours as needed., Disp: , Rfl:  .  mirabegron ER (MYRBETRIQ) 50 MG TB24 tablet, Take 50 mg  by mouth daily., Disp: , Rfl:  .  montelukast (SINGULAIR) 10 MG tablet, Take 10 mg by mouth at bedtime., Disp: , Rfl:  .  Multiple Vitamins-Minerals (MULTIVITAMIN GUMMIES ADULTS) CHEW, Chew by mouth., Disp: , Rfl:  .  PE-GG-APAP & PE-DPH-APAP (MUCINEX SINUS-MAX DAY/NIGHT) LIQUID MISC, Take by mouth., Disp: , Rfl:  .  potassium chloride SA (K-DUR,KLOR-CON) 20 MEQ tablet, Take 20 mEq by mouth 2 (two) times daily., Disp: , Rfl:  .  Probiotic Product (PROBIOTIC COLON SUPPORT) CAPS, Take 1 capsule by mouth at bedtime., Disp: , Rfl:  .  sucralfate (CARAFATE) 1 G tablet, Take 1 g by mouth 2 (two) times daily., Disp: , Rfl:  .  traMADol (ULTRAM) 50 MG tablet, Take 50 mg by mouth every 6  (six) hours as needed., Disp: , Rfl:  .  triamterene-hydrochlorothiazide (MAXZIDE) 75-50 MG tablet, Take 1 tablet by mouth daily., Disp: , Rfl:  .  Vitamin D, Ergocalciferol, (DRISDOL) 50000 UNITS CAPS capsule, Take 50,000 Units by mouth every 7 (seven) days., Disp: , Rfl:  .  amoxicillin-clavulanate (AUGMENTIN) 875-125 MG per tablet, Take 1 tablet by mouth 2 (two) times daily. X 7 days (Patient not taking: Reported on 10/09/2014), Disp: 14 tablet, Rfl: 0 .  cholecalciferol (VITAMIN D) 1000 UNITS tablet, Take 1,000 Units by mouth daily., Disp: , Rfl:  .  meclizine (ANTIVERT) 25 MG tablet, Take 1 tablet (25 mg total) by mouth 4 (four) times daily. (Patient not taking: Reported on 10/09/2014), Disp: 28 tablet, Rfl: 0 .  oxycodone-acetaminophen (LYNOX) 5-300 MG per tablet, Take 1 tablet by mouth every 8 (eight) hours as needed for pain., Disp: , Rfl:  .  Pediatric Multiple Vitamins (CHEWABLE MULTIPLE VITAMINS PO), Take by mouth., Disp: , Rfl:  .  ramipril (ALTACE) 10 MG capsule, Take 10 mg by mouth 2 (two) times daily., Disp: , Rfl:   There are no active problems to display for this patient. 's medical history significant for essential hypertension  Hyperlipidemia  Asthma Seasonal allergies Hemorrhoids  history of migraines  and obesity  generalized abdominal pain Osteoarthritis  Chickenpox  Cigarette smoking  Past surgeries include abdominal hysterectomy with partial vaginectomy  Gastric bypass 7  Rotator cuff repair  Left wrist surgery  Laparoscopy with gastroplasty and vertical banding for obesity       Allergies  Allergen Reactions  . Tape Swelling  . Iodine     family history significant for colon polyps hepatitis C and lupus  Physical exam:    PERRL, EOMI  Alert and Ox3  H:  RRR   Lungs:  CTA Inspection of the low back reveals paraspinous muscle tenderness but no overt trigger points. She ambulates well with no evidence of any significant lower extremity  weakness. She does have tenderness about the knees low back and hips with motion. She also walks with an antalgic gait and requires a walker for assistance.  Assessment:  1. Diffuse body pain  2osteoarthritis diffuse  34facet arthropathy with generalized degenerative disc disease and lumbar go2  4.  Sleep apnea   5.  Chronic narcotic usage for generalized pain   Plan:    1. About a long discussion with patient regarding her care. I think would be reasonable for her to be on hydrocodone 5 mg tablets 1 tablet each night approximate 5 nights of the week. I think this will keep her pain under reasonable control and reduce her chance for problems associated with her sleep apnea. On the days when she  is not taking her p.m. narcotics she could use the Ultram and ibuprofen dose to cover. We've gone over this extensively and she voices understanding.  2We talked about her need to proceed with efforts at continued weight loss and continue with physical  therapy exercises and continue follow-up with her primary care. We will write for her First Vicodin prescription today. She is to return to clinic in 1 month. Dr. Vashti Hey MD 3:32 PM

## 2014-10-10 ENCOUNTER — Ambulatory Visit
Admission: RE | Admit: 2014-10-10 | Discharge: 2014-10-10 | Disposition: A | Discharge status: Home / Self Care | Payer: Medicare Other | Source: Ambulatory Visit | Attending: Otolaryngology | Admitting: Otolaryngology

## 2014-10-10 DIAGNOSIS — M4310 Spondylolisthesis, site unspecified: Secondary | ICD-10-CM | POA: Diagnosis not present

## 2014-10-10 DIAGNOSIS — M4802 Spinal stenosis, cervical region: Secondary | ICD-10-CM | POA: Diagnosis not present

## 2014-10-10 DIAGNOSIS — R42 Dizziness and giddiness: Secondary | ICD-10-CM | POA: Diagnosis not present

## 2014-10-10 DIAGNOSIS — M5481 Occipital neuralgia: Secondary | ICD-10-CM

## 2014-10-10 MED ORDER — GADOBENATE DIMEGLUMINE 529 MG/ML IV SOLN
20.0000 mL | Freq: Once | INTRAVENOUS | Status: AC | PRN
  Administered 2014-10-10: 20 mL via INTRAVENOUS

## 2014-10-15 ENCOUNTER — Other Ambulatory Visit: Payer: Self-pay | Admitting: Family Medicine

## 2014-10-15 ENCOUNTER — Ambulatory Visit
Admission: RE | Admit: 2014-10-15 | Discharge: 2014-10-15 | Disposition: A | Discharge status: Home / Self Care | Payer: Medicare Other | Source: Ambulatory Visit | Attending: Family Medicine | Admitting: Family Medicine

## 2014-10-15 DIAGNOSIS — Z1231 Encounter for screening mammogram for malignant neoplasm of breast: Secondary | ICD-10-CM

## 2014-10-15 DIAGNOSIS — N63 Unspecified lump in breast: Secondary | ICD-10-CM | POA: Insufficient documentation

## 2014-10-23 ENCOUNTER — Other Ambulatory Visit: Payer: Self-pay | Admitting: Anesthesiology

## 2014-10-30 ENCOUNTER — Encounter: Payer: Self-pay | Admitting: Anesthesiology

## 2014-10-30 ENCOUNTER — Ambulatory Visit: Payer: Medicare Other | Attending: Anesthesiology | Admitting: Anesthesiology

## 2014-10-30 VITALS — BP 149/101 | HR 90 | Temp 98.1°F | Resp 14 | Ht 68.0 in | Wt 323.0 lb

## 2014-10-30 DIAGNOSIS — I10 Essential (primary) hypertension: Secondary | ICD-10-CM | POA: Diagnosis not present

## 2014-10-30 DIAGNOSIS — J45909 Unspecified asthma, uncomplicated: Secondary | ICD-10-CM | POA: Insufficient documentation

## 2014-10-30 DIAGNOSIS — Z9884 Bariatric surgery status: Secondary | ICD-10-CM | POA: Diagnosis not present

## 2014-10-30 DIAGNOSIS — M1712 Unilateral primary osteoarthritis, left knee: Secondary | ICD-10-CM

## 2014-10-30 DIAGNOSIS — E785 Hyperlipidemia, unspecified: Secondary | ICD-10-CM | POA: Diagnosis not present

## 2014-10-30 DIAGNOSIS — G4730 Sleep apnea, unspecified: Secondary | ICD-10-CM | POA: Diagnosis not present

## 2014-10-30 DIAGNOSIS — R52 Pain, unspecified: Secondary | ICD-10-CM | POA: Diagnosis present

## 2014-10-30 DIAGNOSIS — F17210 Nicotine dependence, cigarettes, uncomplicated: Secondary | ICD-10-CM | POA: Diagnosis not present

## 2014-10-30 DIAGNOSIS — E669 Obesity, unspecified: Secondary | ICD-10-CM | POA: Insufficient documentation

## 2014-10-30 DIAGNOSIS — M1990 Unspecified osteoarthritis, unspecified site: Secondary | ICD-10-CM | POA: Diagnosis not present

## 2014-10-30 DIAGNOSIS — R1084 Generalized abdominal pain: Secondary | ICD-10-CM | POA: Diagnosis not present

## 2014-10-30 DIAGNOSIS — J302 Other seasonal allergic rhinitis: Secondary | ICD-10-CM | POA: Insufficient documentation

## 2014-10-30 DIAGNOSIS — G43909 Migraine, unspecified, not intractable, without status migrainosus: Secondary | ICD-10-CM | POA: Diagnosis not present

## 2014-10-30 DIAGNOSIS — G894 Chronic pain syndrome: Secondary | ICD-10-CM | POA: Insufficient documentation

## 2014-10-30 DIAGNOSIS — Z79891 Long term (current) use of opiate analgesic: Secondary | ICD-10-CM | POA: Diagnosis not present

## 2014-10-30 MED ORDER — HYDROCODONE-ACETAMINOPHEN 5-325 MG PO TABS: 1.0000 | ORAL_TABLET | Freq: Four times a day (QID) | ORAL | Status: DC | PRN

## 2014-10-30 NOTE — Progress Notes (Signed)
Safety precautions to be maintained throughout the outpatient stay will include: orient to surroundings, keep bed in low position, maintain call bell within reach at all times, provide assistance with transfer out of bed and ambulation.  

## 2014-11-01 NOTE — Progress Notes (Signed)
CC:  Diffuse body pain  Procedure:  None  HPI:  Wendy Sanchez presents for reevaluation last seen approximately 1 month ago. Quality and characteristic of her pain has been stable in nature and she is tolerating her medications well with no untoward side effects. No new changes have been noted no changes in upper or lower extremity are noted. The areas of pain have been stable in nature. Bowel bladder function is been stable and she is tolerating her medications well based on her narcotic assessment sheet. She claims to be compliant with her medications with no evidence of any diverting or illicit use  .BP 149/101 mmHg  Pulse 90  Temp(Src) 98.1 F (36.7 C) (Oral)  Resp 14  Ht 5\' 8"  (1.727 m)  Wt 323 lb (146.512 kg)  BMI 49.12 kg/m2  SpO2 100%   Current outpatient prescriptions:  .  albuterol (PROVENTIL HFA;VENTOLIN HFA) 108 (90 BASE) MCG/ACT inhaler, Inhale 2 puffs into the lungs every 6 (six) hours as needed for wheezing or shortness of breath., Disp: , Rfl:  .  azelastine (ASTELIN) 0.1 % nasal spray, Place into both nostrils 2 (two) times daily. Use in each nostril as directed, Disp: , Rfl:  .  clotrimazole (LOTRIMIN) 1 % cream, Apply 1 application topically 2 (two) times daily. prn, Disp: , Rfl:  .  cyclobenzaprine (FLEXERIL) 10 MG tablet, Take 10 mg by mouth 3 (three) times daily as needed for muscle spasms., Disp: , Rfl:  .  dexlansoprazole (DEXILANT) 60 MG capsule, Take 60 mg by mouth daily., Disp: , Rfl:  .  diclofenac sodium (VOLTAREN) 1 % GEL, Apply topically 4 (four) times daily., Disp: , Rfl:  .  fexofenadine (ALLEGRA) 180 MG tablet, Take 180 mg by mouth daily., Disp: , Rfl:  .  fluconazole (DIFLUCAN) 150 MG tablet, Take 1 tablet (150 mg total) by mouth daily., Disp: 1 tablet, Rfl: 2 .  Fluticasone-Salmeterol (ADVAIR) 250-50 MCG/DOSE AEPB, Inhale 1 puff into the lungs 2 (two) times daily., Disp: , Rfl:  .  folic acid (FOLVITE) 1 MG tablet, Take 1 mg by mouth daily., Disp: , Rfl:  .   furosemide (LASIX) 40 MG tablet, Take 40 mg by mouth., Disp: , Rfl:  .  HYDROcodone-acetaminophen (NORCO/VICODIN) 5-325 MG tablet, Take 1 tablet by mouth every 6 (six) hours as needed., Disp: 45 tablet, Rfl: 0 .  ibuprofen (ADVIL,MOTRIN) 800 MG tablet, Take 800 mg by mouth every 8 (eight) hours as needed., Disp: , Rfl:  .  mirabegron ER (MYRBETRIQ) 50 MG TB24 tablet, Take 50 mg by mouth daily., Disp: , Rfl:  .  montelukast (SINGULAIR) 10 MG tablet, Take 10 mg by mouth at bedtime., Disp: , Rfl:  .  Multiple Vitamins-Minerals (MULTIVITAMIN GUMMIES ADULTS) CHEW, Chew by mouth., Disp: , Rfl:  .  PE-GG-APAP & PE-DPH-APAP (MUCINEX SINUS-MAX DAY/NIGHT) LIQUID MISC, Take by mouth., Disp: , Rfl:  .  Pediatric Multiple Vitamins (CHEWABLE MULTIPLE VITAMINS PO), Take by mouth., Disp: , Rfl:  .  potassium chloride SA (K-DUR,KLOR-CON) 20 MEQ tablet, Take 20 mEq by mouth 2 (two) times daily., Disp: , Rfl:  .  Probiotic Product (PROBIOTIC COLON SUPPORT) CAPS, Take 1 capsule by mouth at bedtime., Disp: , Rfl:  .  sucralfate (CARAFATE) 1 G tablet, Take 1 g by mouth 2 (two) times daily., Disp: , Rfl:  .  traMADol (ULTRAM) 50 MG tablet, Take 50 mg by mouth every 6 (six) hours as needed., Disp: , Rfl:  .  triamterene-hydrochlorothiazide (MAXZIDE) 75-50 MG tablet,  Take 1 tablet by mouth daily., Disp: , Rfl:  .  Vitamin D, Ergocalciferol, (DRISDOL) 50000 UNITS CAPS capsule, Take 50,000 Units by mouth every 7 (seven) days., Disp: , Rfl:  .  amoxicillin-clavulanate (AUGMENTIN) 875-125 MG per tablet, Take 1 tablet by mouth 2 (two) times daily. X 7 days (Patient not taking: Reported on 10/09/2014), Disp: 14 tablet, Rfl: 0 .  cholecalciferol (VITAMIN D) 1000 UNITS tablet, Take 1,000 Units by mouth daily., Disp: , Rfl:  .  gabapentin (NEURONTIN) 300 MG capsule, Take 300 mg by mouth 2 (two) times daily. Takes two capsules twice a day, Disp: , Rfl:  .  meclizine (ANTIVERT) 25 MG tablet, Take 1 tablet (25 mg total) by mouth 4  (four) times daily. (Patient not taking: Reported on 10/09/2014), Disp: 28 tablet, Rfl: 0 .  oxycodone-acetaminophen (LYNOX) 5-300 MG per tablet, Take 1 tablet by mouth every 8 (eight) hours as needed for pain., Disp: , Rfl:  .  ramipril (ALTACE) 10 MG capsule, Take 10 mg by mouth 2 (two) times daily., Disp: , Rfl:   Patient Active Problem List   Diagnosis Date Noted  . Chronic pain syndrome 10/30/2014  's medical history significant for essential hypertension  Hyperlipidemia  Asthma Seasonal allergies Hemorrhoids  history of migraines  and obesity  generalized abdominal pain Osteoarthritis  Chickenpox  Cigarette smoking  Past surgeries include abdominal hysterectomy with partial vaginectomy  Gastric bypass 7  Rotator cuff repair  Left wrist surgery  Laparoscopy with gastroplasty and vertical banding for obesity       Allergies  Allergen Reactions  . Tape Swelling  . Iodine     family history significant for colon polyps hepatitis C and lupus  Physical exam:    PERRL, EOMI  Alert and Ox3  H:  RRR   Lungs:  CTA Inspection of the low back reveals paraspinous muscle tenderness but no overt trigger points. She ambulates well with no evidence of any significant lower extremity weakness. She does have tenderness about the knees low back and hips with motion. She also walks with an antalgic gait and requires a walker for assistance.  Assessment:  1. 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  Sleep apnea   5.  Chronic narcotic usage for generalized pain   Plan:    1. I have had a long discussion with patient regarding her care. I think it is reasonable for her to be on hydrocodone 5 mg tablets 1 tablet each night approximate 5 nights of the week. I think this will keep her pain under reasonable control and reduce her chance for problems associated with her sleep apnea. On  the days when she is not taking her p.m. narcotics she could use the Ultram and ibuprofen dose to cover. We've gone over this extensively and she voices understanding.  2We talked about her need to proceed with efforts at continued weight loss and continue with physical  therapy exercises and continue follow-up with her primary care. I will write for a refill on her Vicodin today with return to clinic in approximately one month. She appears to be doing well with this regimen.  Dr. Vashti Hey MD 4:09 PM

## 2014-11-26 ENCOUNTER — Encounter: Payer: Medicare Other | Admitting: Anesthesiology

## 2014-12-17 ENCOUNTER — Encounter: Payer: Medicare Other | Admitting: Pain Medicine

## 2014-12-18 ENCOUNTER — Encounter: Payer: Self-pay | Admitting: Anesthesiology

## 2014-12-18 ENCOUNTER — Ambulatory Visit: Payer: Medicare Other | Attending: Anesthesiology | Admitting: Anesthesiology

## 2014-12-18 VITALS — BP 125/83 | HR 79 | Temp 97.9°F | Resp 16 | Ht 68.0 in | Wt 326.0 lb

## 2014-12-18 DIAGNOSIS — M1712 Unilateral primary osteoarthritis, left knee: Secondary | ICD-10-CM | POA: Diagnosis not present

## 2014-12-18 DIAGNOSIS — G894 Chronic pain syndrome: Secondary | ICD-10-CM | POA: Diagnosis not present

## 2014-12-18 MED ORDER — OXYCODONE-ACETAMINOPHEN 7.5-325 MG PO TABS: 1.0000 | ORAL_TABLET | Freq: Three times a day (TID) | ORAL | Status: DC | PRN

## 2014-12-18 NOTE — Progress Notes (Signed)
Patient here today for medication refill.  Patient c/o multiple pain sites.  States that she has had to use more than prescribed amounts with her pain medications d/t pain levels.  States that "Lynox" was actually working better and lasting longer than current prescribed hydrocodone.  Would like to discuss this.

## 2014-12-19 ENCOUNTER — Other Ambulatory Visit: Payer: Self-pay | Admitting: Anesthesiology

## 2014-12-20 NOTE — Progress Notes (Signed)
CC:  Diffuse body pain  Procedure:  None  HPI:  Wendy Sanchez presents for reevaluation last seen approximately 1 month ago. Quality and characteristic of her pain has been stable in nature and she is tolerating her medications well with no untoward side effects. No new changes have been noted no changes in upper or lower extremity are noted. The areas of pain have been stable in nature. Bowel bladder function is been stable and she is tolerating her medications well based on her narcotic assessment sheet. She claims to be compliant with her medications with no evidence of any diverting or illicit use.  She is having some frequent breakthrough pain. And she furthermore feels that the Percocet that she has been on in the past is more effective than hydrocodone. She also reports that she had a recent cervical MRI per Dr. Linzie Collin showing some evidence of cervical degenerative disc disease. The majority of her pain resides in the low back and knees hips and buttocks shoulders and anterior thighs. Of note she did have a negative urine drug screen for hydrocodone but positive for Ultram which she was taking at that time. At that time she was not on hydrocodone.  .BP 125/83 mmHg  Pulse 79  Temp(Src) 97.9 F (36.6 C) (Oral)  Resp 16  Ht 5\' 8"  (1.727 m)  Wt 326 lb (147.873 kg)  BMI 49.58 kg/m2  SpO2 100%   Current outpatient prescriptions:  .  albuterol (PROVENTIL HFA;VENTOLIN HFA) 108 (90 BASE) MCG/ACT inhaler, Inhale 2 puffs into the lungs every 6 (six) hours as needed for wheezing or shortness of breath., Disp: , Rfl:  .  azelastine (ASTELIN) 0.1 % nasal spray, Place into both nostrils 2 (two) times daily. Use in each nostril as directed, Disp: , Rfl:  .  clotrimazole (LOTRIMIN) 1 % cream, Apply 1 application topically 2 (two) times daily. prn, Disp: , Rfl:  .  cyclobenzaprine (FLEXERIL) 10 MG tablet, Take 10 mg by mouth 3 (three) times daily as needed for muscle spasms., Disp: , Rfl:  .  dexlansoprazole  (DEXILANT) 60 MG capsule, Take 60 mg by mouth daily., Disp: , Rfl:  .  diclofenac sodium (VOLTAREN) 1 % GEL, Apply topically 4 (four) times daily., Disp: , Rfl:  .  fexofenadine (ALLEGRA) 180 MG tablet, Take 180 mg by mouth daily., Disp: , Rfl:  .  fluconazole (DIFLUCAN) 150 MG tablet, Take 1 tablet (150 mg total) by mouth daily., Disp: 1 tablet, Rfl: 2 .  Fluticasone-Salmeterol (ADVAIR) 250-50 MCG/DOSE AEPB, Inhale 1 puff into the lungs 2 (two) times daily., Disp: , Rfl:  .  folic acid (FOLVITE) 1 MG tablet, Take 1 mg by mouth daily., Disp: , Rfl:  .  furosemide (LASIX) 40 MG tablet, Take 40 mg by mouth., Disp: , Rfl:  .  gabapentin (NEURONTIN) 300 MG capsule, Take 300 mg by mouth 2 (two) times daily. Takes two capsules twice a day, Disp: , Rfl:  .  HYDROcodone-acetaminophen (NORCO/VICODIN) 5-325 MG tablet, Take 1 tablet by mouth every 6 (six) hours as needed., Disp: 45 tablet, Rfl: 0 .  ibuprofen (ADVIL,MOTRIN) 800 MG tablet, Take 800 mg by mouth every 8 (eight) hours as needed., Disp: , Rfl:  .  mirabegron ER (MYRBETRIQ) 50 MG TB24 tablet, Take 50 mg by mouth daily., Disp: , Rfl:  .  montelukast (SINGULAIR) 10 MG tablet, Take 10 mg by mouth at bedtime., Disp: , Rfl:  .  Multiple Vitamins-Minerals (MULTIVITAMIN GUMMIES ADULTS) CHEW, Chew by mouth., Disp: ,  Rfl:  .  PE-GG-APAP & PE-DPH-APAP (MUCINEX SINUS-MAX DAY/NIGHT) LIQUID MISC, Take by mouth., Disp: , Rfl:  .  potassium chloride SA (K-DUR,KLOR-CON) 20 MEQ tablet, Take 20 mEq by mouth 2 (two) times daily., Disp: , Rfl:  .  Probiotic Product (PROBIOTIC COLON SUPPORT) CAPS, Take 1 capsule by mouth at bedtime., Disp: , Rfl:  .  ramipril (ALTACE) 10 MG capsule, Take 10 mg by mouth 2 (two) times daily., Disp: , Rfl:  .  traMADol (ULTRAM) 50 MG tablet, Take 100 mg by mouth every 6 (six) hours as needed. , Disp: , Rfl:  .  triamterene-hydrochlorothiazide (MAXZIDE) 75-50 MG tablet, Take 1 tablet by mouth daily., Disp: , Rfl:  .  Vitamin D,  Ergocalciferol, (DRISDOL) 50000 UNITS CAPS capsule, Take 50,000 Units by mouth every 7 (seven) days., Disp: , Rfl:  .  amoxicillin-clavulanate (AUGMENTIN) 875-125 MG per tablet, Take 1 tablet by mouth 2 (two) times daily. X 7 days (Patient not taking: Reported on 10/09/2014), Disp: 14 tablet, Rfl: 0 .  cholecalciferol (VITAMIN D) 1000 UNITS tablet, Take 1,000 Units by mouth daily., Disp: , Rfl:  .  meclizine (ANTIVERT) 25 MG tablet, Take 1 tablet (25 mg total) by mouth 4 (four) times daily. (Patient not taking: Reported on 10/09/2014), Disp: 28 tablet, Rfl: 0 .  oxyCODONE-acetaminophen (PERCOCET) 7.5-325 MG tablet, Take 1 tablet by mouth every 8 (eight) hours as needed for severe pain., Disp: 90 tablet, Rfl: 0 .  Pediatric Multiple Vitamins (CHEWABLE MULTIPLE VITAMINS PO), Take by mouth., Disp: , Rfl:  .  sucralfate (CARAFATE) 1 G tablet, Take 1 g by mouth 2 (two) times daily., Disp: , Rfl:   Patient Active Problem List   Diagnosis Date Noted  . Chronic pain syndrome 10/30/2014  's medical history significant for essential hypertension  Hyperlipidemia  Asthma Seasonal allergies Hemorrhoids  history of migraines  and obesity  generalized abdominal pain Osteoarthritis  Chickenpox  Cigarette smoking  Past surgeries include abdominal hysterectomy with partial vaginectomy  Gastric bypass 7  Rotator cuff repair  Left wrist surgery  Laparoscopy with gastroplasty and vertical banding for obesity       Allergies  Allergen Reactions  . Tape Swelling  . Iodine     family history significant for colon polyps hepatitis C and lupus  Physical exam:    PERRL, EOMI  Alert and Ox3  H:  RRR   Lungs:  CTA Inspection of the low back reveals paraspinous muscle tenderness but no overt trigger points. She ambulates well with no evidence of any significant lower extremity weakness. She does have tenderness about the knees low back and hips with motion. She also walks with an antalgic  gait and requires a walker for assistance.  Assessment:  1. 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  Sleep apnea   5.  Chronic narcotic usage for generalized pain   Plan:    1. I have had a long discussion with patient regarding her care. I think it is reasonable for her to switch over to Percocet.. We will use some 0.5 mg tablets 1 by mouth twice a day 3 times a day for breakthrough pain. I have cautioned her about using any other medications with this opioid secondary to her sleep apnea. She had any undue sedation she is instructed to contact us. Furthermore I've encouraged her to trial one day off for a week  to help further avoid tolerance and use her anti-inflammatory medications on that day.  2We talked about her need to proceed with efforts at continued weight loss and continue with physical  therapy exercises and continue follow-up with her primary care. I will write for her Percocet today with return to clinic in approximately one month. She appears to be doing well with this regimen.  Dr. Vashti Hey MD 1:03 PM

## 2014-12-25 LAB — TOXASSURE SELECT 13 (MW), URINE: PDF: 0

## 2015-01-13 HISTORY — PX: JOINT REPLACEMENT: SHX530

## 2015-01-15 ENCOUNTER — Ambulatory Visit: Payer: Medicare Other | Attending: Anesthesiology | Admitting: Anesthesiology

## 2015-01-15 ENCOUNTER — Encounter: Payer: Self-pay | Admitting: Anesthesiology

## 2015-01-15 VITALS — BP 132/85 | HR 77 | Temp 97.5°F | Resp 20 | Ht 68.0 in | Wt 324.0 lb

## 2015-01-15 DIAGNOSIS — G894 Chronic pain syndrome: Secondary | ICD-10-CM | POA: Diagnosis not present

## 2015-01-15 DIAGNOSIS — R1084 Generalized abdominal pain: Secondary | ICD-10-CM | POA: Insufficient documentation

## 2015-01-15 DIAGNOSIS — J302 Other seasonal allergic rhinitis: Secondary | ICD-10-CM | POA: Insufficient documentation

## 2015-01-15 DIAGNOSIS — M1990 Unspecified osteoarthritis, unspecified site: Secondary | ICD-10-CM | POA: Diagnosis not present

## 2015-01-15 DIAGNOSIS — M1288 Other specific arthropathies, not elsewhere classified, other specified site: Secondary | ICD-10-CM | POA: Insufficient documentation

## 2015-01-15 DIAGNOSIS — G4730 Sleep apnea, unspecified: Secondary | ICD-10-CM | POA: Diagnosis not present

## 2015-01-15 DIAGNOSIS — Z9079 Acquired absence of other genital organ(s): Secondary | ICD-10-CM | POA: Diagnosis not present

## 2015-01-15 DIAGNOSIS — Z9884 Bariatric surgery status: Secondary | ICD-10-CM | POA: Insufficient documentation

## 2015-01-15 DIAGNOSIS — E785 Hyperlipidemia, unspecified: Secondary | ICD-10-CM | POA: Insufficient documentation

## 2015-01-15 DIAGNOSIS — M1712 Unilateral primary osteoarthritis, left knee: Secondary | ICD-10-CM | POA: Diagnosis not present

## 2015-01-15 DIAGNOSIS — F1190 Opioid use, unspecified, uncomplicated: Secondary | ICD-10-CM | POA: Insufficient documentation

## 2015-01-15 DIAGNOSIS — F17210 Nicotine dependence, cigarettes, uncomplicated: Secondary | ICD-10-CM | POA: Insufficient documentation

## 2015-01-15 DIAGNOSIS — G43909 Migraine, unspecified, not intractable, without status migrainosus: Secondary | ICD-10-CM | POA: Diagnosis not present

## 2015-01-15 DIAGNOSIS — R52 Pain, unspecified: Secondary | ICD-10-CM | POA: Insufficient documentation

## 2015-01-15 MED ORDER — TRAMADOL HCL 50 MG PO TABS: 100.0000 mg | ORAL_TABLET | Freq: Three times a day (TID) | ORAL | Status: DC

## 2015-01-15 MED ORDER — GABAPENTIN 300 MG PO CAPS: 600.0000 mg | ORAL_CAPSULE | Freq: Three times a day (TID) | ORAL | Status: DC

## 2015-01-15 MED ORDER — OXYCODONE-ACETAMINOPHEN 7.5-325 MG PO TABS: 1.0000 | ORAL_TABLET | Freq: Three times a day (TID) | ORAL | Status: DC | PRN

## 2015-01-15 NOTE — Progress Notes (Signed)
CC:  Diffuse body pain  Procedure:  None  HPI:  Wendy Sanchez was last seen a month ago and continues to do well. The quality characteristic and distribution of her pain are stable in nature with no significant changes noted today. She is tolerating her medications as prescribed and they appear to be giving her benefit. Based on her narcotic assessment sheet she seems to be doing well.   .BP 132/85 mmHg  Pulse 77  Temp(Src) 97.5 F (36.4 C) (Oral)  Resp 20  Ht 5\' 8"  (1.727 m)  Wt 324 lb (146.965 kg)  BMI 49.28 kg/m2  SpO2 99%   Current outpatient prescriptions:  .  albuterol (PROVENTIL HFA;VENTOLIN HFA) 108 (90 BASE) MCG/ACT inhaler, Inhale 2 puffs into the lungs every 6 (six) hours as needed for wheezing or shortness of breath., Disp: , Rfl:  .  azelastine (ASTELIN) 0.1 % nasal spray, Place into both nostrils 2 (two) times daily. Use in each nostril as directed, Disp: , Rfl:  .  cholecalciferol (VITAMIN D) 1000 UNITS tablet, Take 1,000 Units by mouth daily., Disp: , Rfl:  .  clotrimazole (LOTRIMIN) 1 % cream, Apply 1 application topically 2 (two) times daily. prn, Disp: , Rfl:  .  cyclobenzaprine (FLEXERIL) 10 MG tablet, Take 10 mg by mouth 3 (three) times daily as needed for muscle spasms., Disp: , Rfl:  .  dexlansoprazole (DEXILANT) 60 MG capsule, Take 60 mg by mouth daily., Disp: , Rfl:  .  diclofenac sodium (VOLTAREN) 1 % GEL, Apply topically 4 (four) times daily., Disp: , Rfl:  .  fexofenadine (ALLEGRA) 180 MG tablet, Take 180 mg by mouth daily., Disp: , Rfl:  .  Fluticasone-Salmeterol (ADVAIR) 250-50 MCG/DOSE AEPB, Inhale 1 puff into the lungs 2 (two) times daily., Disp: , Rfl:  .  folic acid (FOLVITE) 1 MG tablet, Take 1 mg by mouth daily., Disp: , Rfl:  .  furosemide (LASIX) 40 MG tablet, Take 40 mg by mouth., Disp: , Rfl:  .  gabapentin (NEURONTIN) 300 MG capsule, Take 2 capsules (600 mg total) by mouth 3 (three) times daily. Takes two capsules twice a day, Disp: 180 capsule, Rfl:  3 .  ibuprofen (ADVIL,MOTRIN) 800 MG tablet, Take 800 mg by mouth every 8 (eight) hours as needed., Disp: , Rfl:  .  meclizine (ANTIVERT) 25 MG tablet, Take 1 tablet (25 mg total) by mouth 4 (four) times daily., Disp: 28 tablet, Rfl: 0 .  mirabegron ER (MYRBETRIQ) 50 MG TB24 tablet, Take 50 mg by mouth daily., Disp: , Rfl:  .  montelukast (SINGULAIR) 10 MG tablet, Take 10 mg by mouth at bedtime., Disp: , Rfl:  .  Multiple Vitamins-Minerals (MULTIVITAMIN GUMMIES ADULTS) CHEW, Chew by mouth., Disp: , Rfl:  .  oxyCODONE-acetaminophen (PERCOCET) 7.5-325 MG tablet, Take 1 tablet by mouth every 8 (eight) hours as needed for severe pain., Disp: 90 tablet, Rfl: 0 .  PE-GG-APAP & PE-DPH-APAP (MUCINEX SINUS-MAX DAY/NIGHT) LIQUID MISC, Take by mouth., Disp: , Rfl:  .  Pediatric Multiple Vitamins (CHEWABLE MULTIPLE VITAMINS PO), Take by mouth., Disp: , Rfl:  .  potassium chloride SA (K-DUR,KLOR-CON) 20 MEQ tablet, Take 20 mEq by mouth 2 (two) times daily., Disp: , Rfl:  .  Probiotic Product (PROBIOTIC COLON SUPPORT) CAPS, Take 1 capsule by mouth at bedtime., Disp: , Rfl:  .  ramipril (ALTACE) 10 MG capsule, Take 10 mg by mouth 2 (two) times daily., Disp: , Rfl:  .  sucralfate (CARAFATE) 1 G tablet, Take 1 g  by mouth 2 (two) times daily., Disp: , Rfl:  .  traMADol (ULTRAM) 50 MG tablet, Take 2 tablets (100 mg total) by mouth 3 (three) times daily between meals., Disp: 180 tablet, Rfl: 3 .  triamterene-hydrochlorothiazide (MAXZIDE) 75-50 MG tablet, Take 1 tablet by mouth daily., Disp: , Rfl:  .  Vitamin D, Ergocalciferol, (DRISDOL) 50000 UNITS CAPS capsule, Take 50,000 Units by mouth every 7 (seven) days., Disp: , Rfl:  .  amoxicillin-clavulanate (AUGMENTIN) 875-125 MG per tablet, Take 1 tablet by mouth 2 (two) times daily. X 7 days (Patient not taking: Reported on 10/09/2014), Disp: 14 tablet, Rfl: 0 .  fluconazole (DIFLUCAN) 150 MG tablet, Take 1 tablet (150 mg total) by mouth daily. (Patient not taking:  Reported on 01/15/2015), Disp: 1 tablet, Rfl: 2 .  HYDROcodone-acetaminophen (NORCO/VICODIN) 5-325 MG tablet, Take 1 tablet by mouth every 6 (six) hours as needed. (Patient not taking: Reported on 01/15/2015), Disp: 45 tablet, Rfl: 0  Patient Active Problem List   Diagnosis Date Noted  . Chronic pain syndrome 10/30/2014  's medical history significant for essential hypertension  Hyperlipidemia  Asthma Seasonal allergies Hemorrhoids  history of migraines  and obesity  generalized abdominal pain Osteoarthritis  Chickenpox  Cigarette smoking  Past surgeries include abdominal hysterectomy with partial vaginectomy  Gastric bypass 7  Rotator cuff repair  Left wrist surgery  Laparoscopy with gastroplasty and vertical banding for obesity       Allergies  Allergen Reactions  . Tape Swelling  . Iodine     family history significant for colon polyps hepatitis C and lupus  Physical exam:    PERRL, EOMI  Alert and Ox3  H:  RRR   Lungs:  CTA Inspection of the low back reveals paraspinous muscle tenderness but no overt trigger points. She ambulates well with no evidence of any significant lower extremity weakness. She does have tenderness about the knees low back and hips with motion. She also walks with an antalgic gait and requires a walker for assistance.  Assessment:  1. 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  Sleep apnea   5.  Chronic narcotic usage for generalized pain   Plan:    1. We'll refill her medications with return to clinic in 1 month  2We talked about her need to proceed with efforts at continued weight loss and continue with physical  therapy exercises and continue follow-up with her primary care. I will write for her Percocet today with return to clinic in approximately one month. She appears to be doing well with this regimen.  Dr. Vashti Hey MD 4:11  PM

## 2015-01-15 NOTE — Progress Notes (Signed)
Safety precautions to be maintained throughout the outpatient stay will include: orient to surroundings, keep bed in low position, maintain call bell within reach at all times, provide assistance with transfer out of bed and ambulation.  Dr [name] would like for you to prescribe medications for pain tramadol Medication changed last appt from [place] to Percocet-- Pt states it has been working better for her

## 2015-01-16 ENCOUNTER — Encounter: Payer: Medicare Other | Admitting: Anesthesiology

## 2015-02-11 ENCOUNTER — Encounter: Payer: Self-pay | Admitting: Anesthesiology

## 2015-02-11 ENCOUNTER — Ambulatory Visit: Payer: Medicare Other | Attending: Anesthesiology | Admitting: Anesthesiology

## 2015-02-11 VITALS — BP 150/89 | HR 80 | Temp 98.6°F | Resp 18 | Ht 68.0 in | Wt 324.0 lb

## 2015-02-11 DIAGNOSIS — I10 Essential (primary) hypertension: Secondary | ICD-10-CM | POA: Insufficient documentation

## 2015-02-11 DIAGNOSIS — J45909 Unspecified asthma, uncomplicated: Secondary | ICD-10-CM | POA: Insufficient documentation

## 2015-02-11 DIAGNOSIS — G43909 Migraine, unspecified, not intractable, without status migrainosus: Secondary | ICD-10-CM | POA: Insufficient documentation

## 2015-02-11 DIAGNOSIS — M1712 Unilateral primary osteoarthritis, left knee: Secondary | ICD-10-CM | POA: Diagnosis not present

## 2015-02-11 DIAGNOSIS — G894 Chronic pain syndrome: Secondary | ICD-10-CM | POA: Diagnosis not present

## 2015-02-11 DIAGNOSIS — R1084 Generalized abdominal pain: Secondary | ICD-10-CM | POA: Insufficient documentation

## 2015-02-11 DIAGNOSIS — J302 Other seasonal allergic rhinitis: Secondary | ICD-10-CM | POA: Diagnosis not present

## 2015-02-11 DIAGNOSIS — E669 Obesity, unspecified: Secondary | ICD-10-CM | POA: Insufficient documentation

## 2015-02-11 DIAGNOSIS — Z9884 Bariatric surgery status: Secondary | ICD-10-CM | POA: Insufficient documentation

## 2015-02-11 DIAGNOSIS — R52 Pain, unspecified: Secondary | ICD-10-CM | POA: Diagnosis present

## 2015-02-11 DIAGNOSIS — M1288 Other specific arthropathies, not elsewhere classified, other specified site: Secondary | ICD-10-CM | POA: Diagnosis not present

## 2015-02-11 DIAGNOSIS — M1990 Unspecified osteoarthritis, unspecified site: Secondary | ICD-10-CM | POA: Diagnosis not present

## 2015-02-11 DIAGNOSIS — E785 Hyperlipidemia, unspecified: Secondary | ICD-10-CM | POA: Diagnosis not present

## 2015-02-11 DIAGNOSIS — F17210 Nicotine dependence, cigarettes, uncomplicated: Secondary | ICD-10-CM | POA: Insufficient documentation

## 2015-02-11 DIAGNOSIS — G4730 Sleep apnea, unspecified: Secondary | ICD-10-CM | POA: Diagnosis not present

## 2015-02-11 DIAGNOSIS — B019 Varicella without complication: Secondary | ICD-10-CM | POA: Insufficient documentation

## 2015-02-11 MED ORDER — OXYCODONE-ACETAMINOPHEN 7.5-325 MG PO TABS: 1.0000 | ORAL_TABLET | Freq: Three times a day (TID) | ORAL | Status: DC | PRN

## 2015-02-11 MED ORDER — TRAMADOL HCL 50 MG PO TABS: 100.0000 mg | ORAL_TABLET | Freq: Three times a day (TID) | ORAL | Status: DC

## 2015-02-11 MED ORDER — IBUPROFEN 800 MG PO TABS: 800.0000 mg | ORAL_TABLET | Freq: Three times a day (TID) | ORAL | Status: DC | PRN

## 2015-02-11 NOTE — Progress Notes (Signed)
premenstrual tension syndrome  

## 2015-02-11 NOTE — Patient Instructions (Signed)
Pain Management Discharge Instructions  General Discharge Instructions :  If you need to reach your doctor call: Monday-Friday 8:00 am - 4:00 pm at [phone] or toll free [phone].  After clinic hours [phone] to have operator reach doctor.  Bring all of your medication bottles to all your appointments in the pain clinic.  To cancel or reschedule your appointment with Pain Management please remember to call 24 hours in advance to avoid a fee.  Refer to the educational materials which you have been given on: General Risks, I had my Procedure. Discharge Instructions, Post Sedation.  Post Procedure Instructions:  The drugs you were given will stay in your system until tomorrow, so for the next 24 hours you should not drive, make any legal decisions or drink any alcoholic beverages.  You may eat anything you prefer, but it is better to start with liquids then soups and crackers, and gradually work up to solid foods.  Please notify your doctor immediately if you have any unusual bleeding, trouble breathing or pain that is not related to your normal pain.  Depending on the type of procedure that was done, some parts of your body may feel week and/or numb.  This usually clears up by tonight or the next day.  Walk with the use of an assistive device or accompanied by an adult for the 24 hours.  You may use ice on the affected area for the first 24 hours.  Put ice in a Ziploc bag and cover with a towel and place against area 15 minutes on 15 minutes off.  You may switch to heat after 24 hours.GENERAL RISKS AND COMPLICATIONS  What are the risk, side effects and possible complications? Generally speaking, most procedures are safe.  However, with any procedure there are risks, side effects, and the possibility of complications.  The risks and complications are dependent upon the sites that are lesioned, or the type of nerve block to be performed.  The closer the procedure is to the spine,  the more serious the risks are.  Great care is taken when placing the radio frequency needles, block needles or lesioning probes, but sometimes complications can occur. 1. Infection: Any time there is an injection through the skin, there is a risk of infection.  This is why sterile conditions are used for these blocks.  There are four possible types of infection. 1. Localized skin infection. 2. Central Nervous System Infection-This can be in the form of Meningitis, which can be deadly. 3. Epidural Infections-This can be in the form of an epidural abscess, which can cause pressure inside of the spine, causing compression of the spinal cord with subsequent paralysis. This would require an emergency surgery to decompress, and there are no guarantees that the patient would recover from the paralysis. 4. Discitis-This is an infection of the intervertebral discs.  It occurs in about 1% of discography procedures.  It is difficult to treat and it may lead to surgery.        2. Pain: the needles have to go through skin and soft tissues, will cause soreness.       3. Damage to internal structures:  The nerves to be lesioned may be near blood vessels or    other nerves which can be potentially damaged.       4. Bleeding: Bleeding is more common if the patient is taking blood thinners such as  aspirin, Coumadin, Ticiid, Plavix, etc., or if he/she have some genetic predisposition  such as   hemophilia. Bleeding into the spinal canal can cause compression of the spinal  cord with subsequent paralysis.  This would require an emergency surgery to  decompress and there are no guarantees that the patient would recover from the  paralysis.       5. Pneumothorax:  Puncturing of a lung is a possibility, every time a needle is introduced in  the area of the chest or upper back.  Pneumothorax refers to free air around the  collapsed lung(s), inside of the thoracic cavity (chest cavity).  Another two possible  complications  related to a similar event would include: Hemothorax and Chylothorax.   These are variations of the Pneumothorax, where instead of air around the collapsed  lung(s), you may have blood or chyle, respectively.       6. Spinal headaches: They may occur with any procedures in the area of the spine.       7. Persistent CSF (Cerebro-Spinal Fluid) leakage: This is a rare problem, but may occur  with prolonged intrathecal or epidural catheters either due to the formation of a fistulous  track or a dural tear.       8. Nerve damage: By working so close to the spinal cord, there is always a possibility of  nerve damage, which could be as serious as a permanent spinal cord injury with  paralysis.       9. Death:  Although rare, severe deadly allergic reactions known as "Anaphylactic  reaction" can occur to any of the medications used.      10. Worsening of the symptoms:  We can always make thing worse.  What are the chances of something like this happening? Chances of any of this occuring are extremely low.  By statistics, you have more of a chance of getting killed in a motor vehicle accident: while driving to the hospital than any of the above occurring .  Nevertheless, you should be aware that they are possibilities.  In general, it is similar to taking a shower.  Everybody knows that you can slip, hit your head and get killed.  Does that mean that you should not shower again?  Nevertheless always keep in mind that statistics do not mean anything if you happen to be on the wrong side of them.  Even if a procedure has a 1 (one) in a 1,000,000 (million) chance of going wrong, it you happen to be that one..Also, keep in mind that by statistics, you have more of a chance of having something go wrong when taking medications.  Who should not have this procedure? If you are on a blood thinning medication (e.g. Coumadin, Plavix, see list of "Blood Thinners"), or if you have an active infection going on, you should not  have the procedure.  If you are taking any blood thinners, please inform your physician.  How should I prepare for this procedure?  Do not eat or drink anything at least six hours prior to the procedure.  Bring a driver with you .  It cannot be a taxi.  Come accompanied by an adult that can drive you back, and that is strong enough to help you if your legs get weak or numb from the local anesthetic.  Take all of your medicines the morning of the procedure with just enough water to swallow them.  If you have diabetes, make sure that you are scheduled to have your procedure done first thing in the morning, whenever possible.  If you have diabetes,   take only half of your insulin dose and notify our nurse that you have done so as soon as you arrive at the clinic.  If you are diabetic, but only take blood sugar pills (oral hypoglycemic), then do not take them on the morning of your procedure.  You may take them after you have had the procedure.  Do not take aspirin or any aspirin-containing medications, at least eleven (11) days prior to the procedure.  They may prolong bleeding.  Wear loose fitting clothing that may be easy to take off and that you would not mind if it got stained with Betadine or blood.  Do not wear any jewelry or perfume  Remove any nail coloring.  It will interfere with some of our monitoring equipment.  NOTE: Remember that this is not meant to be interpreted as a complete list of all possible complications.  Unforeseen problems may occur.  BLOOD THINNERS The following drugs contain aspirin or other products, which can cause increased bleeding during surgery and should not be taken for 2 weeks prior to and 1 week after surgery.  If you should need take something for relief of minor pain, you may take acetaminophen which is found in Tylenol,m Datril, Anacin-3 and Panadol. It is not blood thinner. The products listed below are.  Do not take any of the products listed below  in addition to any listed on your instruction sheet.  A.P.C or A.P.C with Codeine Codeine Phosphate Capsules #3 Ibuprofen Ridaura  ABC compound Congesprin Imuran rimadil  Advil Cope Indocin Robaxisal  Alka-Seltzer Effervescent Pain Reliever and Antacid Coricidin or Coricidin-D  Indomethacin Rufen  Alka-Seltzer plus Cold Medicine Cosprin Ketoprofen S-A-C Tablets  Anacin Analgesic Tablets or Capsules Coumadin Korlgesic Salflex  Anacin Extra Strength Analgesic tablets or capsules CP-2 Tablets Lanoril Salicylate  Anaprox Cuprimine Capsules Levenox Salocol  Anexsia-D Dalteparin Magan Salsalate  Anodynos Darvon compound Magnesium Salicylate Sine-off  Ansaid Dasin Capsules Magsal Sodium Salicylate  Anturane Depen Capsules Marnal Soma  APF Arthritis pain formula Dewitt's Pills Measurin Stanback  Argesic Dia-Gesic Meclofenamic Sulfinpyrazone  Arthritis Bayer Timed Release Aspirin Diclofenac Meclomen Sulindac  Arthritis pain formula Anacin Dicumarol Medipren Supac  Analgesic (Safety coated) Arthralgen Diffunasal Mefanamic Suprofen  Arthritis Strength Bufferin Dihydrocodeine Mepro Compound Suprol  Arthropan liquid Dopirydamole Methcarbomol with Aspirin Synalgos  ASA tablets/Enseals Disalcid Micrainin Tagament  Ascriptin Doan's Midol Talwin  Ascriptin A/D Dolene Mobidin Tanderil  Ascriptin Extra Strength Dolobid Moblgesic Ticlid  Ascriptin with Codeine Doloprin or Doloprin with Codeine Momentum Tolectin  Asperbuf Duoprin Mono-gesic Trendar  Aspergum Duradyne Motrin or Motrin IB Triminicin  Aspirin plain, buffered or enteric coated Durasal Myochrisine Trigesic  Aspirin Suppositories Easprin Nalfon Trillsate  Aspirin with Codeine Ecotrin Regular or Extra Strength Naprosyn Uracel  Atromid-S Efficin Naproxen Ursinus  Auranofin Capsules Elmiron Neocylate Vanquish  Axotal Emagrin Norgesic Verin  Azathioprine Empirin or Empirin with Codeine Normiflo Vitamin E  Azolid Emprazil Nuprin Voltaren  Bayer  Aspirin plain, buffered or children's or timed BC Tablets or powders Encaprin Orgaran Warfarin Sodium  Buff-a-Comp Enoxaparin Orudis Zorpin  Buff-a-Comp with Codeine Equegesic Os-Cal-Gesic   Buffaprin Excedrin plain, buffered or Extra Strength Oxalid   Bufferin Arthritis Strength Feldene Oxphenbutazone   Bufferin plain or Extra Strength Feldene Capsules Oxycodone with Aspirin   Bufferin with Codeine Fenoprofen Fenoprofen Pabalate or Pabalate-SF   Buffets II Flogesic Panagesic   Buffinol plain or Extra Strength Florinal or Florinal with Codeine Panwarfarin   Buf-Tabs Flurbiprofen Penicillamine   Butalbital Compound Four-way cold tablets   Penicillin   Butazolidin Fragmin Pepto-Bismol   Carbenicillin Geminisyn Percodan   Carna Arthritis Reliever Geopen Persantine   Carprofen Gold's salt Persistin   Chloramphenicol Goody's Phenylbutazone   Chloromycetin Haltrain Piroxlcam   Clmetidine heparin Plaquenil   Cllnoril Hyco-pap Ponstel   Clofibrate Hydroxy chloroquine Propoxyphen         Before stopping any of these medications, be sure to consult the physician who ordered them.  Some, such as Coumadin (Warfarin) are ordered to prevent or treat serious conditions such as "deep thrombosis", "pumonary embolisms", and other heart problems.  The amount of time that you may need off of the medication may also vary with the medication and the reason for which you were taking it.  If you are taking any of these medications, please make sure you notify your pain physician before you undergo any procedures.         Epidural Steroid Injection Patient Information  Description: The epidural space surrounds the nerves as they exit the spinal cord.  In some patients, the nerves can be compressed and inflamed by a bulging disc or a tight spinal canal (spinal stenosis).  By injecting steroids into the epidural space, we can bring irritated nerves into direct contact with a potentially helpful medication.   These steroids act directly on the irritated nerves and can reduce swelling and inflammation which often leads to decreased pain.  Epidural steroids may be injected anywhere along the spine and from the neck to the low back depending upon the location of your pain.   After numbing the skin with local anesthetic (like Novocaine), a small needle is passed into the epidural space slowly.  You may experience a sensation of pressure while this is being done.  The entire block usually last less than 10 minutes.  Conditions which may be treated by epidural steroids:   Low back and leg pain  Neck and arm pain  Spinal stenosis  Post-laminectomy syndrome  Herpes zoster (shingles) pain  Pain from compression fractures  Preparation for the injection:  1. Do not eat any solid food or dairy products within 6 hours of your appointment.  2. You may drink clear liquids up to 2 hours before appointment.  Clear liquids include water, black coffee, juice or soda.  No milk or cream please. 3. You may take your regular medication, including pain medications, with a sip of water before your appointment  Diabetics should hold regular insulin (if taken separately) and take 1/2 normal NPH dos the morning of the procedure.  Carry some sugar containing items with you to your appointment. 4. A driver must accompany you and be prepared to drive you home after your procedure.  5. Bring all your current medications with your. 6. An IV may be inserted and sedation may be given at the discretion of the physician.   7. A blood pressure cuff, EKG and other monitors will often be applied during the procedure.  Some patients may need to have extra oxygen administered for a short period. 8. You will be asked to provide medical information, including your allergies, prior to the procedure.  We must know immediately if you are taking blood thinners (like Coumadin/Warfarin)  Or if you are allergic to IV iodine contrast (dye). We  must know if you could possible be pregnant.  Possible side-effects:  Bleeding from needle site  Infection (rare, may require surgery)  Nerve injury (rare)  Numbness & tingling (temporary)  Difficulty urinating (rare, temporary)  Spinal headache (   a headache worse with upright posture)  Light -headedness (temporary)  Pain at injection site (several days)  Decreased blood pressure (temporary)  Weakness in arm/leg (temporary)  Pressure sensation in back/neck (temporary)  Call if you experience:  Fever/chills associated with headache or increased back/neck pain.  Headache worsened by an upright position.  New onset weakness or numbness of an extremity below the injection site  Hives or difficulty breathing (go to the emergency room)  Inflammation or drainage at the infection site  Severe back/neck pain  Any new symptoms which are concerning to you  Please note:  Although the local anesthetic injected can often make your back or neck feel good for several hours after the injection, the pain will likely return.  It takes 3-7 days for steroids to work in the epidural space.  You may not notice any pain relief for at least that one week.  If effective, we will often do a series of three injections spaced 3-6 weeks apart to maximally decrease your pain.  After the initial series, we generally will wait several months before considering a repeat injection of the same type.  If you have any questions, please call [phone] [facility]

## 2015-02-12 NOTE — Progress Notes (Signed)
CC:  Diffuse body pain  Procedure:  None  HPI:  Wendy Sanchez continues to report reasonable pain control with her current medication regimen. We have been prescribing Percocet for her diffuse body pain and knee pain. She is tolerating the medications without difficulty best on her narcotic assessment sheet seems to be doing well with this regimen. No changes in quality characteristic or distribution of pain are otherwise noted. She feels that she is getting reasonable with relief with the Percocet however does feel that she notices a significant difference if she's not taking Ultram in addition to this. She denies any illicit or diverting use and maintains compliancy with our regimen..   .BP 150/89 mmHg  Pulse 80  Temp(Src) 98.6 F (37 C) (Oral)  Resp 18  Ht 5\' 8"  (1.727 m)  Wt 324 lb (146.965 kg)  BMI 49.28 kg/m2  SpO2 100%   Current outpatient prescriptions:  .  albuterol (PROVENTIL HFA;VENTOLIN HFA) 108 (90 BASE) MCG/ACT inhaler, Inhale 2 puffs into the lungs every 6 (six) hours as needed for wheezing or shortness of breath., Disp: , Rfl:  .  azelastine (ASTELIN) 0.1 % nasal spray, Place into both nostrils 2 (two) times daily. Use in each nostril as directed, Disp: , Rfl:  .  clotrimazole (LOTRIMIN) 1 % cream, Apply 1 application topically 2 (two) times daily. prn, Disp: , Rfl:  .  cyclobenzaprine (FLEXERIL) 10 MG tablet, Take 10 mg by mouth 3 (three) times daily as needed for muscle spasms., Disp: , Rfl:  .  dexlansoprazole (DEXILANT) 60 MG capsule, Take 60 mg by mouth daily., Disp: , Rfl:  .  fexofenadine (ALLEGRA) 180 MG tablet, Take 180 mg by mouth daily., Disp: , Rfl:  .  Fluticasone-Salmeterol (ADVAIR) 250-50 MCG/DOSE AEPB, Inhale 1 puff into the lungs 2 (two) times daily., Disp: , Rfl:  .  folic acid (FOLVITE) 1 MG tablet, Take 1 mg by mouth daily., Disp: , Rfl:  .  furosemide (LASIX) 40 MG tablet, Take 40 mg by mouth., Disp: , Rfl:  .  gabapentin (NEURONTIN) 300 MG capsule, Take 2  capsules (600 mg total) by mouth 3 (three) times daily. Takes two capsules twice a day, Disp: 180 capsule, Rfl: 3 .  ibuprofen (ADVIL,MOTRIN) 800 MG tablet, Take 1 tablet (800 mg total) by mouth every 8 (eight) hours as needed., Disp: 90 tablet, Rfl: 3 .  mirabegron ER (MYRBETRIQ) 50 MG TB24 tablet, Take 50 mg by mouth daily., Disp: , Rfl:  .  montelukast (SINGULAIR) 10 MG tablet, Take 10 mg by mouth at bedtime., Disp: , Rfl:  .  Multiple Vitamins-Minerals (MULTIVITAMIN GUMMIES ADULTS) CHEW, Chew by mouth., Disp: , Rfl:  .  oxyCODONE-acetaminophen (PERCOCET) 7.5-325 MG tablet, Take 1 tablet by mouth every 8 (eight) hours as needed for severe pain., Disp: 90 tablet, Rfl: 0 .  PE-GG-APAP & PE-DPH-APAP (MUCINEX SINUS-MAX DAY/NIGHT) LIQUID MISC, Take by mouth., Disp: , Rfl:  .  Pediatric Multiple Vitamins (CHEWABLE MULTIPLE VITAMINS PO), Take by mouth., Disp: , Rfl:  .  potassium chloride SA (K-DUR,KLOR-CON) 20 MEQ tablet, Take 20 mEq by mouth 2 (two) times daily., Disp: , Rfl:  .  Probiotic Product (PROBIOTIC COLON SUPPORT) CAPS, Take 1 capsule by mouth at bedtime., Disp: , Rfl:  .  ramipril (ALTACE) 10 MG capsule, Take 10 mg by mouth 2 (two) times daily., Disp: , Rfl:  .  sucralfate (CARAFATE) 1 G tablet, Take 1 g by mouth 2 (two) times daily., Disp: , Rfl:  .  traMADol (ULTRAM) 50 MG tablet, Take 2 tablets (100 mg total) by mouth 3 (three) times daily between meals., Disp: 180 tablet, Rfl: 3 .  triamterene-hydrochlorothiazide (MAXZIDE) 75-50 MG tablet, Take 1 tablet by mouth daily., Disp: , Rfl:  .  Vitamin D, Ergocalciferol, (DRISDOL) 50000 UNITS CAPS capsule, Take 50,000 Units by mouth every 7 (seven) days., Disp: , Rfl:  .  amoxicillin-clavulanate (AUGMENTIN) 875-125 MG per tablet, Take 1 tablet by mouth 2 (two) times daily. X 7 days (Patient not taking: Reported on 10/09/2014), Disp: 14 tablet, Rfl: 0 .  cholecalciferol (VITAMIN D) 1000 UNITS tablet, Take 1,000 Units by mouth daily. Reported on  02/11/2015, Disp: , Rfl:  .  diclofenac sodium (VOLTAREN) 1 % GEL, Apply topically 4 (four) times daily., Disp: , Rfl:  .  fluconazole (DIFLUCAN) 150 MG tablet, Take 1 tablet (150 mg total) by mouth daily. (Patient not taking: Reported on 01/15/2015), Disp: 1 tablet, Rfl: 2 .  HYDROcodone-acetaminophen (NORCO/VICODIN) 5-325 MG tablet, Take 1 tablet by mouth every 6 (six) hours as needed. (Patient not taking: Reported on 02/11/2015), Disp: 45 tablet, Rfl: 0 .  meclizine (ANTIVERT) 25 MG tablet, Take 1 tablet (25 mg total) by mouth 4 (four) times daily. (Patient not taking: Reported on 02/11/2015), Disp: 28 tablet, Rfl: 0  Patient Active Problem List   Diagnosis Date Noted  . Chronic pain syndrome 10/30/2014  's medical history significant for essential hypertension  Hyperlipidemia  Asthma Seasonal allergies Hemorrhoids  history of migraines  and obesity  generalized abdominal pain Osteoarthritis  Chickenpox  Cigarette smoking  Past surgeries include abdominal hysterectomy with partial vaginectomy  Gastric bypass 7  Rotator cuff repair  Left wrist surgery  Laparoscopy with gastroplasty and vertical banding for obesity       Allergies  Allergen Reactions  . Tape Swelling  . Iodine     family history significant for colon polyps hepatitis C and lupus  Physical exam:    PERRL, EOMI  Alert and Ox3  H:  RRR   Lungs:  CTA Inspection of the low back reveals paraspinous muscle tenderness but no overt trigger points. She ambulates well with no evidence of any significant lower extremity weakness. She does have tenderness about the knees low back and hips with motion. She also walks with an antalgic gait and requires a walker for assistance.  Assessment:  1. 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  Sleep apnea   5.  Chronic narcotic usage for generalized pain   Plan:    1.  We'll refill her medications with return to clinic in 1 month. We will also assist her with prescriptions on ibuprofen and Ultram area have talked to her about limiting her ibuprofen use to 5 out of 7 days of the week. She furthermore needs to take this on a full stomach if possible and we have gone over risks for gastric irritation kidney and liver dysfunction potentially  associated with the 800 3 times a day dosing  2We talked about her need to proceed with efforts at continued weight loss and continue with physical  therapy exercises and continue follow-up with her primary care. I will write for her Percocet today with return to clinic in approximately one month. She appears to be doing well with this regimen. Lastly we did have a discussion about the chronicity of her back pain and she reports that she  does have evidence of degenerative disc disease and some spinal stenosis and on her next visit we will proceed with a lumbar epidural steroid injection as discussed with her in detail today. We will give her some information regarding this as well.  Dr. Vashti Hey MD 5:20 PM

## 2015-03-13 ENCOUNTER — Ambulatory Visit: Payer: Medicare Other | Attending: Anesthesiology | Admitting: Anesthesiology

## 2015-03-13 ENCOUNTER — Encounter: Payer: Self-pay | Admitting: Anesthesiology

## 2015-03-13 VITALS — BP 133/83 | HR 78 | Temp 97.9°F | Resp 18 | Ht 68.0 in | Wt 324.0 lb

## 2015-03-13 DIAGNOSIS — G894 Chronic pain syndrome: Secondary | ICD-10-CM

## 2015-03-13 DIAGNOSIS — G8929 Other chronic pain: Secondary | ICD-10-CM | POA: Diagnosis not present

## 2015-03-13 DIAGNOSIS — I10 Essential (primary) hypertension: Secondary | ICD-10-CM | POA: Diagnosis not present

## 2015-03-13 DIAGNOSIS — Z9889 Other specified postprocedural states: Secondary | ICD-10-CM | POA: Insufficient documentation

## 2015-03-13 DIAGNOSIS — B019 Varicella without complication: Secondary | ICD-10-CM | POA: Diagnosis not present

## 2015-03-13 DIAGNOSIS — J302 Other seasonal allergic rhinitis: Secondary | ICD-10-CM | POA: Insufficient documentation

## 2015-03-13 DIAGNOSIS — G43909 Migraine, unspecified, not intractable, without status migrainosus: Secondary | ICD-10-CM | POA: Diagnosis not present

## 2015-03-13 DIAGNOSIS — Z9884 Bariatric surgery status: Secondary | ICD-10-CM | POA: Diagnosis not present

## 2015-03-13 DIAGNOSIS — E785 Hyperlipidemia, unspecified: Secondary | ICD-10-CM | POA: Diagnosis not present

## 2015-03-13 DIAGNOSIS — M1990 Unspecified osteoarthritis, unspecified site: Secondary | ICD-10-CM | POA: Diagnosis not present

## 2015-03-13 DIAGNOSIS — R52 Pain, unspecified: Secondary | ICD-10-CM | POA: Insufficient documentation

## 2015-03-13 DIAGNOSIS — Z79891 Long term (current) use of opiate analgesic: Secondary | ICD-10-CM | POA: Diagnosis not present

## 2015-03-13 DIAGNOSIS — G4730 Sleep apnea, unspecified: Secondary | ICD-10-CM | POA: Diagnosis not present

## 2015-03-13 DIAGNOSIS — M1712 Unilateral primary osteoarthritis, left knee: Secondary | ICD-10-CM

## 2015-03-13 DIAGNOSIS — Z90710 Acquired absence of both cervix and uterus: Secondary | ICD-10-CM | POA: Insufficient documentation

## 2015-03-13 DIAGNOSIS — R1084 Generalized abdominal pain: Secondary | ICD-10-CM | POA: Insufficient documentation

## 2015-03-13 DIAGNOSIS — M4690 Unspecified inflammatory spondylopathy, site unspecified: Secondary | ICD-10-CM | POA: Insufficient documentation

## 2015-03-13 DIAGNOSIS — E669 Obesity, unspecified: Secondary | ICD-10-CM | POA: Diagnosis not present

## 2015-03-13 DIAGNOSIS — J45909 Unspecified asthma, uncomplicated: Secondary | ICD-10-CM | POA: Insufficient documentation

## 2015-03-13 DIAGNOSIS — F17210 Nicotine dependence, cigarettes, uncomplicated: Secondary | ICD-10-CM | POA: Diagnosis not present

## 2015-03-13 MED ORDER — OXYCODONE-ACETAMINOPHEN 7.5-325 MG PO TABS: 1.0000 | ORAL_TABLET | Freq: Three times a day (TID) | ORAL | Status: DC | PRN

## 2015-03-13 NOTE — Progress Notes (Signed)
CC:  Diffuse body pain  Procedure:  None  HPI:  Wendy Sanchez continues to report reasonable pain control with her current medication regimen. We have been prescribing Percocet for her diffuse body pain and knee pain. She is tolerating the medications without difficulty best on her narcotic assessment sheet seems to be doing well with this regimen. No changes in quality characteristic or distribution of pain are otherwise noted. She feels that she is getting reasonable relief with the Percocet however does feel that she notices a significant difference if she's not taking Ultram in addition to this. She denies any illicit or diverting use and maintains compliancy with our regimen.   .BP 133/83 mmHg  Pulse 78  Temp(Src) 97.9 F (36.6 C)  Resp 18  Ht 5\' 8"  (1.727 m)  Wt 324 lb (146.965 kg)  BMI 49.28 kg/m2  SpO2 99%   Current outpatient prescriptions:  .  albuterol (PROVENTIL HFA;VENTOLIN HFA) 108 (90 BASE) MCG/ACT inhaler, Inhale 2 puffs into the lungs every 6 (six) hours as needed for wheezing or shortness of breath., Disp: , Rfl:  .  amoxicillin-clavulanate (AUGMENTIN) 875-125 MG per tablet, Take 1 tablet by mouth 2 (two) times daily. X 7 days, Disp: 14 tablet, Rfl: 0 .  azelastine (ASTELIN) 0.1 % nasal spray, Place into both nostrils 2 (two) times daily. Use in each nostril as directed, Disp: , Rfl:  .  cholecalciferol (VITAMIN D) 1000 UNITS tablet, Take 1,000 Units by mouth daily. Reported on 02/11/2015, Disp: , Rfl:  .  clotrimazole (LOTRIMIN) 1 % cream, Apply 1 application topically 2 (two) times daily. prn, Disp: , Rfl:  .  cyclobenzaprine (FLEXERIL) 10 MG tablet, Take 10 mg by mouth 3 (three) times daily as needed for muscle spasms., Disp: , Rfl:  .  dexlansoprazole (DEXILANT) 60 MG capsule, Take 60 mg by mouth daily., Disp: , Rfl:  .  diclofenac sodium (VOLTAREN) 1 % GEL, Apply topically 4 (four) times daily., Disp: , Rfl:  .  fexofenadine (ALLEGRA) 180 MG tablet, Take 180 mg by mouth  daily., Disp: , Rfl:  .  fluconazole (DIFLUCAN) 150 MG tablet, Take 1 tablet (150 mg total) by mouth daily., Disp: 1 tablet, Rfl: 2 .  Fluticasone-Salmeterol (ADVAIR) 250-50 MCG/DOSE AEPB, Inhale 1 puff into the lungs 2 (two) times daily., Disp: , Rfl:  .  folic acid (FOLVITE) 1 MG tablet, Take 1 mg by mouth daily., Disp: , Rfl:  .  furosemide (LASIX) 40 MG tablet, Take 40 mg by mouth., Disp: , Rfl:  .  gabapentin (NEURONTIN) 300 MG capsule, Take 2 capsules (600 mg total) by mouth 3 (three) times daily. Takes two capsules twice a day, Disp: 180 capsule, Rfl: 3 .  HYDROcodone-acetaminophen (NORCO/VICODIN) 5-325 MG tablet, Take 1 tablet by mouth every 6 (six) hours as needed., Disp: 45 tablet, Rfl: 0 .  ibuprofen (ADVIL,MOTRIN) 800 MG tablet, Take 1 tablet (800 mg total) by mouth every 8 (eight) hours as needed., Disp: 90 tablet, Rfl: 3 .  meclizine (ANTIVERT) 25 MG tablet, Take 1 tablet (25 mg total) by mouth 4 (four) times daily., Disp: 28 tablet, Rfl: 0 .  mirabegron ER (MYRBETRIQ) 50 MG TB24 tablet, Take 50 mg by mouth daily., Disp: , Rfl:  .  montelukast (SINGULAIR) 10 MG tablet, Take 10 mg by mouth at bedtime., Disp: , Rfl:  .  Multiple Vitamins-Minerals (MULTIVITAMIN GUMMIES ADULTS) CHEW, Chew by mouth., Disp: , Rfl:  .  oxyCODONE-acetaminophen (PERCOCET) 7.5-325 MG tablet, Take 1 tablet by mouth  every 8 (eight) hours as needed for severe pain., Disp: 90 tablet, Rfl: 0 .  PE-GG-APAP & PE-DPH-APAP (MUCINEX SINUS-MAX DAY/NIGHT) LIQUID MISC, Take by mouth., Disp: , Rfl:  .  Pediatric Multiple Vitamins (CHEWABLE MULTIPLE VITAMINS PO), Take by mouth., Disp: , Rfl:  .  potassium chloride SA (K-DUR,KLOR-CON) 20 MEQ tablet, Take 20 mEq by mouth 2 (two) times daily., Disp: , Rfl:  .  Probiotic Product (PROBIOTIC COLON SUPPORT) CAPS, Take 1 capsule by mouth at bedtime., Disp: , Rfl:  .  ramipril (ALTACE) 10 MG capsule, Take 10 mg by mouth 2 (two) times daily., Disp: , Rfl:  .  sucralfate (CARAFATE) 1 G  tablet, Take 1 g by mouth 2 (two) times daily., Disp: , Rfl:  .  traMADol (ULTRAM) 50 MG tablet, Take 2 tablets (100 mg total) by mouth 3 (three) times daily between meals., Disp: 180 tablet, Rfl: 3 .  triamterene-hydrochlorothiazide (MAXZIDE) 75-50 MG tablet, Take 1 tablet by mouth daily., Disp: , Rfl:  .  Vitamin D, Ergocalciferol, (DRISDOL) 50000 UNITS CAPS capsule, Take 50,000 Units by mouth every 7 (seven) days., Disp: , Rfl:   Patient Active Problem List   Diagnosis Date Noted  . Chronic pain syndrome 10/30/2014  's medical history significant for essential hypertension  Hyperlipidemia  Asthma Seasonal allergies Hemorrhoids  history of migraines  and obesity  generalized abdominal pain Osteoarthritis  Chickenpox  Cigarette smoking  Past surgeries include abdominal hysterectomy with partial vaginectomy  Gastric bypass 7  Rotator cuff repair  Left wrist surgery  Laparoscopy with gastroplasty and vertical banding for obesity       Allergies  Allergen Reactions  . Tape Swelling  . Iodine     family history significant for colon polyps hepatitis C and lupus  Physical exam:    PERRL, EOMI  Alert and Ox3  H:  RRR   Lungs:  CTA Inspection of the low back reveals paraspinous muscle tenderness but no overt trigger points. She ambulates well with no evidence of any significant lower extremity weakness. She does have tenderness about the knees low back and hips with motion. She also walks with an antalgic gait and requires a walker for assistance.  Assessment:  1. 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  Sleep apnea   5.  Chronic narcotic usage for generalized pain   Plan:    1. We'll refill her medications with return to clinic in 1 month.   2 We talked about her need to proceed with efforts at continued weight loss and continue with physical  therapy exercises  and continue follow-up with her primary care. I will write for her Percocet today with return to clinic in approximately one month. She appears to be doing well with this regimen.  3. She would be a candidate for an epidural steroid injection to see if this could diminish some of the chronic low back pain with bilateral hip pain she has been experiencing. Dr. Vashti Hey MD 2:28 PM

## 2015-03-13 NOTE — Progress Notes (Signed)
Safety precautions to be maintained throughout the outpatient stay will include: orient to surroundings, keep bed in low position, maintain call bell within reach at all times, provide assistance with transfer out of bed and ambulation.  

## 2015-03-22 LAB — TOXASSURE SELECT 13 (MW), URINE: PDF: 0

## 2015-04-16 ENCOUNTER — Ambulatory Visit: Payer: Medicare Other | Attending: Anesthesiology | Admitting: Anesthesiology

## 2015-04-16 ENCOUNTER — Encounter: Payer: Self-pay | Admitting: Anesthesiology

## 2015-04-16 VITALS — BP 149/96 | HR 86 | Temp 98.0°F | Resp 16 | Ht 68.0 in | Wt 325.0 lb

## 2015-04-16 DIAGNOSIS — M1712 Unilateral primary osteoarthritis, left knee: Secondary | ICD-10-CM

## 2015-04-16 DIAGNOSIS — K649 Unspecified hemorrhoids: Secondary | ICD-10-CM | POA: Insufficient documentation

## 2015-04-16 DIAGNOSIS — R52 Pain, unspecified: Secondary | ICD-10-CM | POA: Diagnosis present

## 2015-04-16 DIAGNOSIS — E669 Obesity, unspecified: Secondary | ICD-10-CM | POA: Insufficient documentation

## 2015-04-16 DIAGNOSIS — Z79891 Long term (current) use of opiate analgesic: Secondary | ICD-10-CM | POA: Insufficient documentation

## 2015-04-16 DIAGNOSIS — F17210 Nicotine dependence, cigarettes, uncomplicated: Secondary | ICD-10-CM | POA: Insufficient documentation

## 2015-04-16 DIAGNOSIS — G4730 Sleep apnea, unspecified: Secondary | ICD-10-CM | POA: Insufficient documentation

## 2015-04-16 DIAGNOSIS — R1084 Generalized abdominal pain: Secondary | ICD-10-CM | POA: Insufficient documentation

## 2015-04-16 DIAGNOSIS — G894 Chronic pain syndrome: Secondary | ICD-10-CM

## 2015-04-16 DIAGNOSIS — G43909 Migraine, unspecified, not intractable, without status migrainosus: Secondary | ICD-10-CM | POA: Diagnosis not present

## 2015-04-16 DIAGNOSIS — Z6841 Body Mass Index (BMI) 40.0 and over, adult: Secondary | ICD-10-CM | POA: Diagnosis not present

## 2015-04-16 DIAGNOSIS — J45909 Unspecified asthma, uncomplicated: Secondary | ICD-10-CM | POA: Insufficient documentation

## 2015-04-16 DIAGNOSIS — E785 Hyperlipidemia, unspecified: Secondary | ICD-10-CM | POA: Insufficient documentation

## 2015-04-16 DIAGNOSIS — M1990 Unspecified osteoarthritis, unspecified site: Secondary | ICD-10-CM | POA: Diagnosis not present

## 2015-04-16 DIAGNOSIS — J302 Other seasonal allergic rhinitis: Secondary | ICD-10-CM | POA: Diagnosis not present

## 2015-04-16 DIAGNOSIS — Z9884 Bariatric surgery status: Secondary | ICD-10-CM | POA: Diagnosis not present

## 2015-04-16 DIAGNOSIS — I10 Essential (primary) hypertension: Secondary | ICD-10-CM | POA: Diagnosis not present

## 2015-04-16 MED ORDER — OXYCODONE-ACETAMINOPHEN 7.5-325 MG PO TABS: 1.0000 | ORAL_TABLET | Freq: Three times a day (TID) | ORAL | Status: DC | PRN

## 2015-04-16 NOTE — Progress Notes (Signed)
CC:  Diffuse body pain  Procedure:  None  HPI:  Wendy Sanchez continues to report reasonable pain control with her current medication regimen. We have been prescribing Percocet for her diffuse body pain and knee pain. She is tolerating the medications without difficulty best on her narcotic assessment sheet seems to be doing well with this regimen. No changes in quality characteristic or distribution of pain are otherwise noted. She feels that she is getting reasonable relief with the Percocet however does feel that she notices a significant difference if she's not taking Ultram in addition to this. She denies any illicit or diverting use and maintains compliancy with our regimen. No other changes in her symptom complex since her last visit.   .BP 149/96 mmHg  Pulse 86  Temp(Src) 98 F (36.7 C) (Oral)  Resp 16  Ht 5\' 8"  (1.727 m)  Wt 325 lb (147.419 kg)  BMI 49.43 kg/m2  SpO2 100%   Current outpatient prescriptions:  .  albuterol (PROVENTIL HFA;VENTOLIN HFA) 108 (90 BASE) MCG/ACT inhaler, Inhale 2 puffs into the lungs every 6 (six) hours as needed for wheezing or shortness of breath., Disp: , Rfl:  .  azelastine (ASTELIN) 0.1 % nasal spray, Place into both nostrils 2 (two) times daily. Use in each nostril as directed, Disp: , Rfl:  .  cholecalciferol (VITAMIN D) 1000 UNITS tablet, Take 1,000 Units by mouth daily. Reported on 02/11/2015, Disp: , Rfl:  .  clotrimazole (LOTRIMIN) 1 % cream, Apply 1 application topically 2 (two) times daily. prn, Disp: , Rfl:  .  cyclobenzaprine (FLEXERIL) 10 MG tablet, Take 10 mg by mouth 3 (three) times daily as needed for muscle spasms., Disp: , Rfl:  .  dexlansoprazole (DEXILANT) 60 MG capsule, Take 60 mg by mouth daily., Disp: , Rfl:  .  diclofenac sodium (VOLTAREN) 1 % GEL, Apply topically 4 (four) times daily., Disp: , Rfl:  .  fexofenadine (ALLEGRA) 180 MG tablet, Take 180 mg by mouth daily., Disp: , Rfl:  .  fluconazole (DIFLUCAN) 150 MG tablet, Take 1  tablet (150 mg total) by mouth daily., Disp: 1 tablet, Rfl: 2 .  Fluticasone-Salmeterol (ADVAIR) 250-50 MCG/DOSE AEPB, Inhale 1 puff into the lungs 2 (two) times daily., Disp: , Rfl:  .  folic acid (FOLVITE) 1 MG tablet, Take 1 mg by mouth daily., Disp: , Rfl:  .  furosemide (LASIX) 40 MG tablet, Take 40 mg by mouth., Disp: , Rfl:  .  gabapentin (NEURONTIN) 300 MG capsule, Take 2 capsules (600 mg total) by mouth 3 (three) times daily. Takes two capsules twice a day, Disp: 180 capsule, Rfl: 3 .  HYDROcodone-acetaminophen (NORCO/VICODIN) 5-325 MG tablet, Take 1 tablet by mouth every 6 (six) hours as needed., Disp: 45 tablet, Rfl: 0 .  ibuprofen (ADVIL,MOTRIN) 800 MG tablet, Take 1 tablet (800 mg total) by mouth every 8 (eight) hours as needed., Disp: 90 tablet, Rfl: 3 .  meclizine (ANTIVERT) 25 MG tablet, Take 1 tablet (25 mg total) by mouth 4 (four) times daily., Disp: 28 tablet, Rfl: 0 .  mirabegron ER (MYRBETRIQ) 50 MG TB24 tablet, Take 50 mg by mouth daily., Disp: , Rfl:  .  montelukast (SINGULAIR) 10 MG tablet, Take 10 mg by mouth at bedtime., Disp: , Rfl:  .  Multiple Vitamins-Minerals (MULTIVITAMIN GUMMIES ADULTS) CHEW, Chew by mouth., Disp: , Rfl:  .  oxyCODONE-acetaminophen (PERCOCET) 7.5-325 MG tablet, Take 1 tablet by mouth every 8 (eight) hours as needed for severe pain., Disp: 90 tablet, Rfl:  0 .  PE-GG-APAP & PE-DPH-APAP (MUCINEX SINUS-MAX DAY/NIGHT) LIQUID MISC, Take by mouth., Disp: , Rfl:  .  Pediatric Multiple Vitamins (CHEWABLE MULTIPLE VITAMINS PO), Take by mouth., Disp: , Rfl:  .  potassium chloride SA (K-DUR,KLOR-CON) 20 MEQ tablet, Take 20 mEq by mouth 2 (two) times daily., Disp: , Rfl:  .  Probiotic Product (PROBIOTIC COLON SUPPORT) CAPS, Take 1 capsule by mouth at bedtime., Disp: , Rfl:  .  ramipril (ALTACE) 10 MG capsule, Take 10 mg by mouth 2 (two) times daily., Disp: , Rfl:  .  sucralfate (CARAFATE) 1 G tablet, Take 1 g by mouth 2 (two) times daily., Disp: , Rfl:  .   traMADol (ULTRAM) 50 MG tablet, Take 2 tablets (100 mg total) by mouth 3 (three) times daily between meals., Disp: 180 tablet, Rfl: 3 .  triamterene-hydrochlorothiazide (MAXZIDE) 75-50 MG tablet, Take 1 tablet by mouth daily., Disp: , Rfl:  .  Vitamin D, Ergocalciferol, (DRISDOL) 50000 UNITS CAPS capsule, Take 50,000 Units by mouth every 7 (seven) days., Disp: , Rfl:  .  amoxicillin-clavulanate (AUGMENTIN) 875-125 MG per tablet, Take 1 tablet by mouth 2 (two) times daily. X 7 days, Disp: 14 tablet, Rfl: 0  Patient Active Problem List   Diagnosis Date Noted  . Chronic pain syndrome 10/30/2014  's medical history significant for essential hypertension  Hyperlipidemia  Asthma Seasonal allergies Hemorrhoids  history of migraines  and obesity  generalized abdominal pain Osteoarthritis  Chickenpox  Cigarette smoking  Past surgeries include abdominal hysterectomy with partial vaginectomy  Gastric bypass 7  Rotator cuff repair  Left wrist surgery  Laparoscopy with gastroplasty and vertical banding for obesity       Allergies  Allergen Reactions  . Tape Swelling  . Iodine     family history significant for colon polyps hepatitis C and lupus  Physical exam:    PERRL, EOMI  Alert and Ox3  H:  RRR   Lungs:  CTA Inspection of the low back reveals paraspinous muscle tenderness but no overt trigger points. She ambulates well with no evidence of any significant lower extremity weakness. She does have tenderness about the knees low back and hips with motion. She also walks with an antalgic gait and requires a walker for assistance.And no other changes on examination today  Assessment:  1. 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  Sleep apnea   5.  Chronic narcotic usage for generalized pain   Plan:    1. We'll refill her medications with return to clinic in 2 month.   2 We  talked about her need to proceed with efforts at continued weight loss and continue with physical  therapy exercises and continue follow-up with her primary care. I will write for her Percocet today with return to clinic in approximately 2 month. She appears to be doing well with this regimen.  3. She would be a candidate for an epidural steroid injection to see if this could diminish some of the chronic low back pain with bilateral hip pain she has been experiencing. Dr. Vashti Hey MD 5:03 PM

## 2015-04-16 NOTE — Progress Notes (Signed)
Safety precautions to be maintained throughout the outpatient stay will include: orient to surroundings, keep bed in low position, maintain call bell within reach at all times, provide assistance with transfer out of bed and ambulation.  

## 2015-06-18 ENCOUNTER — Ambulatory Visit: Payer: Medicare Other | Attending: Anesthesiology | Admitting: Anesthesiology

## 2015-06-18 ENCOUNTER — Other Ambulatory Visit: Payer: Self-pay | Admitting: Anesthesiology

## 2015-06-18 ENCOUNTER — Encounter: Payer: Self-pay | Admitting: Anesthesiology

## 2015-06-18 VITALS — BP 135/86 | HR 84 | Temp 98.0°F | Resp 20 | Ht 68.0 in | Wt 320.0 lb

## 2015-06-18 DIAGNOSIS — G43909 Migraine, unspecified, not intractable, without status migrainosus: Secondary | ICD-10-CM | POA: Diagnosis not present

## 2015-06-18 DIAGNOSIS — E785 Hyperlipidemia, unspecified: Secondary | ICD-10-CM | POA: Insufficient documentation

## 2015-06-18 DIAGNOSIS — B019 Varicella without complication: Secondary | ICD-10-CM | POA: Insufficient documentation

## 2015-06-18 DIAGNOSIS — J45909 Unspecified asthma, uncomplicated: Secondary | ICD-10-CM | POA: Insufficient documentation

## 2015-06-18 DIAGNOSIS — G4730 Sleep apnea, unspecified: Secondary | ICD-10-CM | POA: Diagnosis not present

## 2015-06-18 DIAGNOSIS — Z79891 Long term (current) use of opiate analgesic: Secondary | ICD-10-CM | POA: Diagnosis not present

## 2015-06-18 DIAGNOSIS — M1288 Other specific arthropathies, not elsewhere classified, other specified site: Secondary | ICD-10-CM | POA: Insufficient documentation

## 2015-06-18 DIAGNOSIS — M1712 Unilateral primary osteoarthritis, left knee: Secondary | ICD-10-CM | POA: Diagnosis not present

## 2015-06-18 DIAGNOSIS — Z9884 Bariatric surgery status: Secondary | ICD-10-CM | POA: Diagnosis not present

## 2015-06-18 DIAGNOSIS — E669 Obesity, unspecified: Secondary | ICD-10-CM | POA: Diagnosis not present

## 2015-06-18 DIAGNOSIS — M48061 Spinal stenosis, lumbar region without neurogenic claudication: Secondary | ICD-10-CM

## 2015-06-18 DIAGNOSIS — R1084 Generalized abdominal pain: Secondary | ICD-10-CM | POA: Diagnosis not present

## 2015-06-18 DIAGNOSIS — R52 Pain, unspecified: Secondary | ICD-10-CM | POA: Diagnosis present

## 2015-06-18 DIAGNOSIS — I10 Essential (primary) hypertension: Secondary | ICD-10-CM | POA: Diagnosis not present

## 2015-06-18 DIAGNOSIS — G894 Chronic pain syndrome: Secondary | ICD-10-CM | POA: Diagnosis not present

## 2015-06-18 DIAGNOSIS — M5136 Other intervertebral disc degeneration, lumbar region: Secondary | ICD-10-CM | POA: Diagnosis not present

## 2015-06-18 DIAGNOSIS — M1990 Unspecified osteoarthritis, unspecified site: Secondary | ICD-10-CM | POA: Insufficient documentation

## 2015-06-18 DIAGNOSIS — M4806 Spinal stenosis, lumbar region: Secondary | ICD-10-CM

## 2015-06-18 DIAGNOSIS — F17210 Nicotine dependence, cigarettes, uncomplicated: Secondary | ICD-10-CM | POA: Insufficient documentation

## 2015-06-18 MED ORDER — OXYCODONE-ACETAMINOPHEN 10-325 MG PO TABS: 1.0000 | ORAL_TABLET | ORAL | Status: DC | PRN

## 2015-06-18 NOTE — Patient Instructions (Signed)
Epidural Steroid Injection Patient Information  Description: The epidural space surrounds the nerves as they exit the spinal cord.  In some patients, the nerves can be compressed and inflamed by a bulging disc or a tight spinal canal (spinal stenosis).  By injecting steroids into the epidural space, we can bring irritated nerves into direct contact with a potentially helpful medication.  These steroids act directly on the irritated nerves and can reduce swelling and inflammation which often leads to decreased pain.  Epidural steroids may be injected anywhere along the spine and from the neck to the low back depending upon the location of your pain.   After numbing the skin with local anesthetic (like Novocaine), a small needle is passed into the epidural space slowly.  You may experience a sensation of pressure while this is being done.  The entire block usually last less than 10 minutes.  Conditions which may be treated by epidural steroids:   Low back and leg pain  Neck and arm pain  Spinal stenosis  Post-laminectomy syndrome  Herpes zoster (shingles) pain  Pain from compression fractures  Preparation for the injection:  1. Do not eat any solid food or dairy products within 8 hours of your appointment.  2. You may drink clear liquids up to 3 hours before appointment.  Clear liquids include water, black coffee, juice or soda.  No milk or cream please. 3. You may take your regular medication, including pain medications, with a sip of water before your appointment  Diabetics should hold regular insulin (if taken separately) and take 1/2 normal NPH dos the morning of the procedure.  Carry some sugar containing items with you to your appointment. 4. A driver must accompany you and be prepared to drive you home after your procedure.  5. Bring all your current medications with your. 6. An IV may be inserted and sedation may be given at the discretion of the physician.   7. A blood pressure  cuff, EKG and other monitors will often be applied during the procedure.  Some patients may need to have extra oxygen administered for a short period. 8. You will be asked to provide medical information, including your allergies, prior to the procedure.  We must know immediately if you are taking blood thinners (like Coumadin/Warfarin)  Or if you are allergic to IV iodine contrast (dye). We must know if you could possible be pregnant.  Possible side-effects:  Bleeding from needle site  Infection (rare, may require surgery)  Nerve injury (rare)  Numbness & tingling (temporary)  Difficulty urinating (rare, temporary)  Spinal headache ( a headache worse with upright posture)  Light -headedness (temporary)  Pain at injection site (several days)  Decreased blood pressure (temporary)  Weakness in arm/leg (temporary)  Pressure sensation in back/neck (temporary)  Call if you experience:  Fever/chills associated with headache or increased back/neck pain.  Headache worsened by an upright position.  New onset weakness or numbness of an extremity below the injection site  Hives or difficulty breathing (go to the emergency room)  Inflammation or drainage at the infection site  Severe back/neck pain  Any new symptoms which are concerning to you  Please note:  Although the local anesthetic injected can often make your back or neck feel good for several hours after the injection, the pain will likely return.  It takes 3-7 days for steroids to work in the epidural space.  You may not notice any pain relief for at least that one week.    If effective, we will often do a series of three injections spaced 3-6 weeks apart to maximally decrease your pain.  After the initial series, we generally will wait several months before considering a repeat injection of the same type.  If you have any questions, please call [phone] [facility] 

## 2015-06-19 DIAGNOSIS — M48061 Spinal stenosis, lumbar region without neurogenic claudication: Secondary | ICD-10-CM

## 2015-06-19 HISTORY — DX: Spinal stenosis, lumbar region without neurogenic claudication: M48.061

## 2015-06-19 NOTE — Progress Notes (Signed)
CC:  Diffuse body pain  Procedure:  None  HPI:  Zaiyah continues to report reasonable pain control with her current medication regimen. We have been prescribing Percocet for her diffuse body pain and knee pain. She is tolerating the medications without difficulty based on her narcotic assessment sheet and seems to be doing well with this regimen. She has had some worsening of the low back pain but it is of the same quality and characteristic and distribution as previously documented. She is unsure if the Ultram is helping. Bowel and bladder function have been normal and based on her narcotic assessment sheet she is tolerating the Percocet well.  .BP 135/86 mmHg  Pulse 84  Temp(Src) 98 F (36.7 C) (Oral)  Resp 20  Ht 5\' 8"  (1.727 m)  Wt 320 lb (145.151 kg)  BMI 48.67 kg/m2  SpO2 100%   Current outpatient prescriptions:  .  albuterol (PROVENTIL HFA;VENTOLIN HFA) 108 (90 BASE) MCG/ACT inhaler, Inhale 2 puffs into the lungs every 6 (six) hours as needed for wheezing or shortness of breath., Disp: , Rfl:  .  azelastine (ASTELIN) 0.1 % nasal spray, Place into both nostrils 2 (two) times daily. Use in each nostril as directed, Disp: , Rfl:  .  clotrimazole (LOTRIMIN) 1 % cream, Apply 1 application topically 2 (two) times daily. prn, Disp: , Rfl:  .  cyclobenzaprine (FLEXERIL) 10 MG tablet, Take 10 mg by mouth 3 (three) times daily as needed for muscle spasms., Disp: , Rfl:  .  dexlansoprazole (DEXILANT) 60 MG capsule, Take 60 mg by mouth daily., Disp: , Rfl:  .  diclofenac sodium (VOLTAREN) 1 % GEL, Apply topically 4 (four) times daily., Disp: , Rfl:  .  fexofenadine (ALLEGRA) 180 MG tablet, Take 180 mg by mouth daily., Disp: , Rfl:  .  Fluticasone-Salmeterol (ADVAIR) 250-50 MCG/DOSE AEPB, Inhale 1 puff into the lungs 2 (two) times daily., Disp: , Rfl:  .  folic acid (FOLVITE) 1 MG tablet, Take 1 mg by mouth daily., Disp: , Rfl:  .  furosemide (LASIX) 40 MG tablet, Take 40 mg by mouth., Disp: ,  Rfl:  .  gabapentin (NEURONTIN) 300 MG capsule, Take 2 capsules (600 mg total) by mouth 3 (three) times daily. Takes two capsules twice a day, Disp: 180 capsule, Rfl: 3 .  ibuprofen (ADVIL,MOTRIN) 800 MG tablet, Take 1 tablet (800 mg total) by mouth every 8 (eight) hours as needed., Disp: 90 tablet, Rfl: 3 .  mirabegron ER (MYRBETRIQ) 50 MG TB24 tablet, Take 50 mg by mouth daily., Disp: , Rfl:  .  montelukast (SINGULAIR) 10 MG tablet, Take 10 mg by mouth at bedtime., Disp: , Rfl:  .  Multiple Vitamins-Minerals (MULTIVITAMIN GUMMIES ADULTS) CHEW, Chew by mouth., Disp: , Rfl:  .  potassium chloride SA (K-DUR,KLOR-CON) 20 MEQ tablet, Take 20 mEq by mouth 2 (two) times daily., Disp: , Rfl:  .  Probiotic Product (PROBIOTIC COLON SUPPORT) CAPS, Take 1 capsule by mouth at bedtime., Disp: , Rfl:  .  ramipril (ALTACE) 10 MG capsule, Take 10 mg by mouth 2 (two) times daily., Disp: , Rfl:  .  sucralfate (CARAFATE) 1 G tablet, Take 1 g by mouth 2 (two) times daily., Disp: , Rfl:  .  traMADol (ULTRAM) 50 MG tablet, Take 2 tablets (100 mg total) by mouth 3 (three) times daily between meals., Disp: 180 tablet, Rfl: 3 .  triamterene-hydrochlorothiazide (MAXZIDE) 75-50 MG tablet, Take 1 tablet by mouth daily., Disp: , Rfl:  .  Vitamin  D, Ergocalciferol, (DRISDOL) 50000 UNITS CAPS capsule, Take 50,000 Units by mouth every 7 (seven) days., Disp: , Rfl:  .  oxyCODONE-acetaminophen (PERCOCET) 10-325 MG tablet, Take 1 tablet by mouth every 4 (four) hours as needed for pain., Disp: 90 tablet, Rfl: 0  Patient Active Problem List   Diagnosis Date Noted  . Spinal stenosis of lumbar region 06/19/2015  . Chronic pain syndrome 10/30/2014  's medical history significant for essential hypertension  Hyperlipidemia  Asthma Seasonal allergies Hemorrhoids  history of migraines  and obesity  generalized abdominal pain Osteoarthritis  Chickenpox  Cigarette smoking  Past surgeries include abdominal hysterectomy with  partial vaginectomy  Gastric bypass 7  Rotator cuff repair  Left wrist surgery  Laparoscopy with gastroplasty and vertical banding for obesity       Allergies  Allergen Reactions  . Other Diarrhea and Swelling    Other reaction(s): Abdominal Pain CLAM CHOWDER  . Tape Swelling  . Iodine     family history significant for colon polyps hepatitis C and lupus  Physical exam:    PERRL, EOMI  Alert and Ox3  H:  RRR   Lungs:  CTA Inspection of the low back reveals paraspinous muscle tenderness but no overt trigger points. She ambulates well with no evidence of any significant lower extremity weakness. She does have tenderness about the knees low back and hips with motion. She also walks with an antalgic gait and requires a walker for assistance.And no other changes on examination today  Assessment:  1. 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  Sleep apnea   5.  Chronic narcotic usage for generalized pain   Plan:    1. We'll refill her medications with return to clinic in 2 month.   2 We talked about her need to proceed with efforts at continued weight loss and continue with physical  therapy exercises and continue follow-up with her primary care. She has taken Percocet 10 mg tablets in the past and had more effective pain relief. I think this would be reasonable based on her clinical situation. 3. She would be a candidate for an epidural steroid injection to see if this could diminish some of the chronic low back pain with bilateral hip pain she has been experiencing and we will plan for this at her next visit. Dr. Vashti Hey MD 8:27 AM

## 2015-07-04 ENCOUNTER — Other Ambulatory Visit: Payer: Self-pay

## 2015-07-04 ENCOUNTER — Telehealth: Payer: Self-pay

## 2015-07-04 NOTE — Telephone Encounter (Signed)
Pts pharmacy have faxed a request for refill for ibuprofen and tramadol.. Pt says she is in a lot of pain [facility] in [place]

## 2015-07-04 NOTE — Telephone Encounter (Signed)
Spoke with patient.  Script sent in for Ibuprofen only.  Patient notified.

## 2015-07-05 ENCOUNTER — Ambulatory Visit
Admission: EM | Admit: 2015-07-05 | Discharge: 2015-07-05 | Disposition: A | Discharge status: Home / Self Care | Payer: Medicare Other | Attending: Family Medicine | Admitting: Family Medicine

## 2015-07-05 ENCOUNTER — Encounter: Payer: Self-pay | Admitting: *Deleted

## 2015-07-05 ENCOUNTER — Other Ambulatory Visit: Payer: Self-pay

## 2015-07-05 DIAGNOSIS — B370 Candidal stomatitis: Secondary | ICD-10-CM

## 2015-07-05 DIAGNOSIS — R300 Dysuria: Secondary | ICD-10-CM

## 2015-07-05 LAB — URINALYSIS COMPLETE WITH MICROSCOPIC (ARMC ONLY)
BILIRUBIN URINE: NEGATIVE
GLUCOSE, UA: NEGATIVE mg/dL
KETONES UR: NEGATIVE mg/dL
Leukocytes, UA: NEGATIVE
Nitrite: NEGATIVE
PROTEIN: NEGATIVE mg/dL
SPECIFIC GRAVITY, URINE: 1.015 (ref 1.005–1.030)
pH: 7 (ref 5.0–8.0)

## 2015-07-05 MED ORDER — FIRST-DUKES MOUTHWASH MT SUSP: 5.0000 mL | Freq: Three times a day (TID) | OROMUCOSAL | Status: DC | PRN

## 2015-07-05 MED ORDER — IBUPROFEN 800 MG PO TABS: 800.0000 mg | ORAL_TABLET | Freq: Three times a day (TID) | ORAL | Status: DC | PRN

## 2015-07-05 NOTE — ED Provider Notes (Signed)
[facility]  ____________________________________________  Time seen: Approximately 11:09 AM  I have reviewed the triage vital signs and the nursing notes.   HISTORY  Chief Complaint Abdominal Pain; Headache; Sore Throat; Back Pain; and Generalized Body Aches  HPI Wendy Sanchez is a 65 y.o. female with chronic pain history who presents today for request evaluation of her thrush and to make sure she did not have a urinary tract infection. Patient states that she has chronic neck pain, back pain, abdominal pain and extremity pains. Patient states that she does have intermittent thrush for the last several years. Patient states that 10 days ago she felt like she was certain have sores to her mouth and irritation to her tongue again which is consistent with her thrush history. Patient states at that time she did take an oral Diflucan on the 13th and on the 18th as well as intermittently has been using her clotrimazole lozenges. Patient states that she is out of her dukes mouthwash. Patient states that she wanted to make sure that her thrush has been treated appropriately as well as to get more dukes mouthwash.  Patient reports that she does occasionally have urinary tract infections. Patient states that yesterday and today she felt she was having to urinate a little more frequently and had some mild suprapubic discomfort. Patient again states that she has chronic abdominal pain in which that abdominal pain has not changed but states that she wanted to make sure that she did not have a UTI. Patient reports that she has been swimming a lot in trying to lose weight recently and states that sometimes swimming and they chemicals can cause some irritation causing urinary symptoms. Patient denies any vaginal discharge, vaginal odors, vaginal itching. Denies symptoms of vaginitis in which she states that she has had before and that this feels different. Denies fevers. Reports continues to eat and  drink well.  Denies falls or trauma. Denies chest pain, shortness of breath, change in chronic pain, change in chronic lower extremity swelling bilaterally, dizziness or weakness.  PCP: Kary Kos patient reports has a follow-up appointment next week..   Past Medical History  Diagnosis Date  . Hypertension   . GERD (gastroesophageal reflux disease)   . Arthritis   . Allergic rhinitis     Patient Active Problem List   Diagnosis Date Noted  . Spinal stenosis of lumbar region 06/19/2015  . Chronic pain syndrome 10/30/2014    Past Surgical History  Procedure Laterality Date  . Abdominal hysterectomy  1979    left ovary remains  . Ganglion cyst excision    . Knee surgery Bilateral     both knees  2001    Current Outpatient Rx  Name  Route  Sig  Dispense  Refill  . albuterol (PROVENTIL HFA;VENTOLIN HFA) 108 (90 BASE) MCG/ACT inhaler   Inhalation   Inhale 2 puffs into the lungs every 6 (six) hours as needed for wheezing or shortness of breath.         Marland Kitchen azelastine (ASTELIN) 0.1 % nasal spray   Each Nare   Place into both nostrils 2 (two) times daily. Use in each nostril as directed         . clotrimazole (LOTRIMIN) 1 % cream   Topical   Apply 1 application topically 2 (two) times daily. prn         . dexlansoprazole (DEXILANT) 60 MG capsule   Oral   Take 60 mg by mouth daily.         Marland Kitchen  diclofenac sodium (VOLTAREN) 1 % GEL   Topical   Apply topically 4 (four) times daily.         . fexofenadine (ALLEGRA) 180 MG tablet   Oral   Take 180 mg by mouth daily.         . Fluticasone-Salmeterol (ADVAIR) 250-50 MCG/DOSE AEPB   Inhalation   Inhale 1 puff into the lungs 2 (two) times daily.         . folic acid (FOLVITE) 1 MG tablet   Oral   Take 1 mg by mouth daily.         . furosemide (LASIX) 40 MG tablet   Oral   Take 40 mg by mouth.         . gabapentin (NEURONTIN) 300 MG capsule   Oral   Take 2 capsules (600 mg total) by mouth 3 (three) times  daily. Takes two capsules twice a day   180 capsule   3   . mirabegron ER (MYRBETRIQ) 50 MG TB24 tablet   Oral   Take 50 mg by mouth daily.         . montelukast (SINGULAIR) 10 MG tablet   Oral   Take 10 mg by mouth at bedtime.         . Multiple Vitamins-Minerals (MULTIVITAMIN GUMMIES ADULTS) CHEW   Oral   Chew by mouth.         . oxyCODONE-acetaminophen (PERCOCET) 10-325 MG tablet   Oral   Take 1 tablet by mouth every 4 (four) hours as needed for pain.   90 tablet   0   . potassium chloride SA (K-DUR,KLOR-CON) 20 MEQ tablet   Oral   Take 20 mEq by mouth 2 (two) times daily.         . Probiotic Product (PROBIOTIC COLON SUPPORT) CAPS   Oral   Take 1 capsule by mouth at bedtime.         . ramipril (ALTACE) 10 MG capsule   Oral   Take 10 mg by mouth 2 (two) times daily.         . sucralfate (CARAFATE) 1 G tablet   Oral   Take 1 g by mouth 2 (two) times daily.         . traMADol (ULTRAM) 50 MG tablet   Oral   Take 2 tablets (100 mg total) by mouth 3 (three) times daily between meals.   180 tablet   3   . triamterene-hydrochlorothiazide (MAXZIDE) 75-50 MG tablet   Oral   Take 1 tablet by mouth daily.         . Vitamin D, Ergocalciferol, (DRISDOL) 50000 UNITS CAPS capsule   Oral   Take 50,000 Units by mouth every 7 (seven) days.         . cyclobenzaprine (FLEXERIL) 10 MG tablet   Oral   Take 10 mg by mouth 3 (three) times daily as needed for muscle spasms.         .           . ibuprofen (ADVIL,MOTRIN) 800 MG tablet   Oral   Take 1 tablet (800 mg total) by mouth every 8 (eight) hours as needed.   90 tablet   0     Allergies Other; Tape; and Iodine  Family History  Problem Relation Age of Onset  . Hypertension Father   . Deep vein thrombosis Father   . Dementia Mother   . Diabetes Sister   . Congestive Heart  Failure Sister   . Congestive Heart Failure Brother   . Congestive Heart Failure Daughter   . Congestive Heart Failure  Son   . Breast cancer Maternal Aunt     great aunt    Social History Social History  Substance Use Topics  . Smoking status: Current Every Day Smoker -- 0.50 packs/day for 30 years    Types: Cigarettes  . Smokeless tobacco: None  . Alcohol Use: No    Review of Systems Constitutional: No fever/chills Eyes: No visual changes. ENT: No sore throat. Cardiovascular: Denies chest pain. Respiratory: Denies shortness of breath. Gastrointestinal: No abdominal pain.  No nausea, no vomiting.  No diarrhea.  No constipation. Genitourinary: As above.  Musculoskeletal: Negative for back pain. Skin: Negative for rash. Neurological: Negative for headaches, focal weakness or numbness.  10-point ROS otherwise negative.  ____________________________________________   PHYSICAL EXAM:  VITAL SIGNS: ED Triage Vitals  Enc Vitals Group     BP 07/05/15 1044 137/84 mmHg     Pulse Rate 07/05/15 1044 87     Resp 07/05/15 1044 20     Temp 07/05/15 1044 98 F (36.7 C)     Temp Source 07/05/15 1044 Oral     SpO2 07/05/15 1044 99 %     Weight 07/05/15 1044 317 lb (143.79 kg)     Height 07/05/15 1044 5\' 8"  (1.727 m)     Head Cir --      Peak Flow --      Pain Score 07/05/15 1054 8     Pain Loc --      Pain Edu? --      Excl. in [place]? --     Constitutional: Alert and oriented. Well appearing and in no acute distress. Eyes: Conjunctivae are normal. PERRL. EOMI. Head: Atraumatic.  Ears: Normal external appearance bilaterally.   Nose: No congestion/rhinnorhea.  Mouth/Throat: Mucous membranes are moist.  Oropharynx non-erythematous.No tonsillar swelling or exudate. Dorsal posterior tongue mild whitish coloration as well as mild erythema to roof of mouth. No other rash, lesions or color changes.  Neck: No stridor.  No cervical spine tenderness to palpation. Hematological/Lymphatic/Immunilogical: No cervical lymphadenopathy. Cardiovascular: Normal rate, regular rhythm. Grossly normal heart sounds.   Good peripheral circulation. Respiratory: Normal respiratory effort.  No retractions. Lungs CTAB.No wheezes, rales or rhonchi.  Gastrointestinal: Obese abdomen. Minimal suprapubic tenderness, abdomen otherwise Soft and nontender.  Normal Bowel sounds.  No abdominal bruits. No CVA tenderness. Musculoskeletal: Steady gait. Bilateral upper and lower extremity's nontender. Bilateral lower extremity edema, per patient chronic and unchanged, bilateral pedal pulses equal and easily palpated. No cervical or thoracic tenderness tenderness to palpation. mild lumbar and paralumbar tenderness which patient reports is consistent with her chronic low back pain.  Neurologic :  Normal speech and language. No gross focal neurologic deficits are appreciated. No gait instability. Skin:  Skin is warm, dry and intact. No rash noted. Psychiatric: Mood and affect are normal. Speech and behavior are normal.  ____________________________________________   LABS (all labs ordered are listed, but only abnormal results are displayed)  Labs Reviewed  URINE CULTURE - Abnormal; Notable for the following:         All other components within normal limits  URINALYSIS COMPLETEWITH MICROSCOPIC ([facility] ONLY) - Abnormal; Notable for the following:    Hgb urine dipstick TRACE (*)    Bacteria, UA RARE (*)    Squamous Epithelial / LPF 6-30 (*)    All other components within normal limits   ___ INITIAL  IMPRESSION / ASSESSMENT AND PLAN / ED COURSE  Pertinent labs & imaging results that were available during my care of the patient were reviewed by me and considered in my medical decision making (see chart for details).  Very well-appearing patient. No acute distress. Presenting for evaluation of her oral thrush and patient reports she wanted to make sure she did not have urinary tract infection. Patient does have appearance of oral thrush, discussed and recommended continue oral clotrimazole lozenges and will refill dukes  mouthwash. Urinalysis reviewed. Urinalysis with rare bacteria, negative leukocytes, 0-5 wbc's and 6-30 squamous epithelial cells and trace hemoglobin. Concern for contaminated sample and discussed with patient in detail will culture urine. As patient has artery been having issues with her thrush discussed with her do not want to place on oral antibiotic is not indicated and discussed that her urine sample may be contaminated and not treat UTI. Patient agreed to this plan and verbalized understanding and states that she does not want to be on antibiotic if needed. Will await urine culture prior to initiating antibiotics. Recommend patient to continue to drink fluids and eat and drink well and close follow-up with PCP.  Directed very close with patient for fevers, abdominal pain, vaginal complaints, chest pain, shortness of breath, dizziness, weakness or worsening concerns patient needs to be reevaluated promptly at urgent care or ER.  Discussed follow up with Primary care physician this week. Discussed follow up and return parameters including no resolution or any worsening concerns. Patient verbalized understanding and agreed to plan.   ____________________________________________   FINAL CLINICAL IMPRESSION(S) / ED DIAGNOSES  Final diagnoses:  Thrush  Dysuria     Discharge Medication List as of 07/05/2015 11:42 AM    START taking these medications   Details  Diphenhyd-Hydrocort-Nystatin (FIRST-DUKES MOUTHWASH) SUSP Use as directed 5 mLs in the mouth or throat 3 (three) times daily as needed (mouth sores. Dukes magic mouthwash.)., Starting 07/05/2015, Until Discontinued, Normal             Note: This dictation was prepared with Dragon dictation along with smaller phrase technology. Any transcriptional errors that result from this process are unintentional.       Marylene Land, NP 07/16/15 1038

## 2015-07-05 NOTE — Discharge Instructions (Signed)
Take medication as prescribed. Continue home lozenges.Eat and drink well. Void when finished swimming.    Follow up with your primary care physician this week. Return to Urgent care or ER for abdominal pain, fevers, chest pain, shortness of breath, new or worsening concerns.    Thrush, Adult  Wendy Sanchez is an infection that can happen on the mouth, throat, tongue, or other areas. It causes Thielen patches to form on the mouth and tongue. HOME CARE  Only take medicine as told by your doctor. You may be given medicine to swallow or to apply right on the area.  Eat plain yogurt that contains live cultures (check the label).  Rinse your mouth many times a day with a warm saltwater rinse. To make the rinse, mix 1 teaspoon (6 g) of salt in 8 ounces (0.2 L) of warm water. To reduce pain:  Drink cold liquids such as water or iced tea.  Eat frozen ice pops or frozen juices.  Eat foods that are easy to swallow, such as gelatin or ice cream.  Drink from a straw if the patches are painful. If you are breastfeeding:  Clean your nipples with an antifungal medicine.  Dry your nipples after breastfeeding.  Use an ointment called lanolin to help relieve nipple soreness. If you wear dentures:  Take out your dentures before going to bed.  Brush them thoroughly.  Soak them in a denture cleaner. GET HELP IF:   Your problems are getting worse.  Your problems are not improving within 7 days of starting treatment.  Your infection is spreading. This may show as Desta patches on the skin outside of your mouth.  You are nursing and have redness and pain in the nipples. MAKE SURE YOU:  Understand these instructions.  Will watch your condition.  Will get help right away if you are not doing well or get worse.   This information is not intended to replace advice given to you by your health care provider. Make sure you discuss any questions you have with your health care provider.   Document  Released: 03/25/2009 Document Revised: 10/19/2012 Document Reviewed: 08/01/2012 Elsevier Interactive Patient Education 2016 Else[employer]  Dysuria Dysuria is pain or discomfort while urinating. The pain or discomfort may be felt in the tube that carries urine out of the bladder (urethra) or in the surrounding tissue of the genitals. The pain may also be felt in the groin area, lower abdomen, and lower back. You may have to urinate frequently or have the sudden feeling that you have to urinate (urgency). Dysuria can affect both men and women, but is more common in women. Dysuria can be caused by many different things, including:  Urinary tract infection in women.  Infection of the kidney or bladder.  Kidney stones or bladder stones.  Certain sexually transmitted infections (STIs), such as chlamydia.  Dehydration.  Inflammation of the vagina.  Use of certain medicines.  Use of certain soaps or scented products that cause irritation. HOME CARE INSTRUCTIONS Watch your dysuria for any changes. The following actions may help to reduce any discomfort you are feeling:  Drink enough fluid to keep your urine clear or pale yellow.  Empty your bladder often. Avoid holding urine for long periods of time.  After a bowel movement or urination, women should cleanse from front to back, using each tissue only once.  Empty your bladder after sexual intercourse.  Take medicines only as directed by your health care provider.  If you were prescribed  an antibiotic medicine, finish it all even if you start to feel better.  Avoid caffeine, tea, and alcohol. They can irritate the bladder and make dysuria worse. In men, alcohol may irritate the prostate.  Keep all follow-up visits as directed by your health care provider. This is important.  If you had any tests done to find the cause of dysuria, it is your responsibility to obtain your test results. Ask the lab or department performing the test when  and how you will get your results. Talk with your health care provider if you have any questions about your results. SEEK MEDICAL CARE IF:  You develop pain in your back or sides.  You have a fever.  You have nausea or vomiting.  You have blood in your urine.  You are not urinating as often as you usually do. SEEK IMMEDIATE MEDICAL CARE IF:  You pain is severe and not relieved with medicines.  You are unable to hold down any fluids.  You or someone else notices a change in your mental function.  You have a rapid heartbeat at rest.  You have shaking or chills.  You feel extremely weak.   This information is not intended to replace advice given to you by your health care provider. Make sure you discuss any questions you have with your health care provider.   Document Released: 09/27/2003 Document Revised: 01/19/2014 Document Reviewed: 08/24/2013 Elsevier Interactive Patient Education [employer].

## 2015-07-05 NOTE — ED Notes (Signed)
Abd pain, headache, sore throat, back pain, body aches, since 10 June. Pt also mouth blisters and hx of Thrush, as well as chronic back pain, abd pain, arthritis, and diarrhea/constipation. Pt is being follwed by Dr. Kary Kos at the [facility] in [place].

## 2015-07-06 LAB — URINE CULTURE

## 2015-07-22 ENCOUNTER — Other Ambulatory Visit: Payer: Self-pay | Admitting: Physician Assistant

## 2015-07-23 ENCOUNTER — Ambulatory Visit: Payer: Medicare Other | Attending: Anesthesiology | Admitting: Anesthesiology

## 2015-07-23 ENCOUNTER — Encounter: Payer: Self-pay | Admitting: Anesthesiology

## 2015-07-23 VITALS — BP 120/72 | HR 95 | Temp 98.1°F | Resp 20 | Ht 68.0 in | Wt 312.0 lb

## 2015-07-23 DIAGNOSIS — B019 Varicella without complication: Secondary | ICD-10-CM | POA: Insufficient documentation

## 2015-07-23 DIAGNOSIS — R1084 Generalized abdominal pain: Secondary | ICD-10-CM | POA: Insufficient documentation

## 2015-07-23 DIAGNOSIS — E669 Obesity, unspecified: Secondary | ICD-10-CM | POA: Insufficient documentation

## 2015-07-23 DIAGNOSIS — M1712 Unilateral primary osteoarthritis, left knee: Secondary | ICD-10-CM | POA: Diagnosis not present

## 2015-07-23 DIAGNOSIS — M4806 Spinal stenosis, lumbar region: Secondary | ICD-10-CM | POA: Diagnosis not present

## 2015-07-23 DIAGNOSIS — E785 Hyperlipidemia, unspecified: Secondary | ICD-10-CM | POA: Diagnosis not present

## 2015-07-23 DIAGNOSIS — J45909 Unspecified asthma, uncomplicated: Secondary | ICD-10-CM | POA: Insufficient documentation

## 2015-07-23 DIAGNOSIS — I10 Essential (primary) hypertension: Secondary | ICD-10-CM | POA: Insufficient documentation

## 2015-07-23 DIAGNOSIS — R52 Pain, unspecified: Secondary | ICD-10-CM | POA: Insufficient documentation

## 2015-07-23 DIAGNOSIS — G4730 Sleep apnea, unspecified: Secondary | ICD-10-CM | POA: Insufficient documentation

## 2015-07-23 DIAGNOSIS — Z6841 Body Mass Index (BMI) 40.0 and over, adult: Secondary | ICD-10-CM | POA: Insufficient documentation

## 2015-07-23 DIAGNOSIS — M1990 Unspecified osteoarthritis, unspecified site: Secondary | ICD-10-CM | POA: Insufficient documentation

## 2015-07-23 DIAGNOSIS — G894 Chronic pain syndrome: Secondary | ICD-10-CM | POA: Diagnosis not present

## 2015-07-23 DIAGNOSIS — Z79891 Long term (current) use of opiate analgesic: Secondary | ICD-10-CM | POA: Diagnosis not present

## 2015-07-23 DIAGNOSIS — F17210 Nicotine dependence, cigarettes, uncomplicated: Secondary | ICD-10-CM | POA: Diagnosis not present

## 2015-07-23 DIAGNOSIS — M5136 Other intervertebral disc degeneration, lumbar region: Secondary | ICD-10-CM | POA: Diagnosis not present

## 2015-07-23 DIAGNOSIS — G43909 Migraine, unspecified, not intractable, without status migrainosus: Secondary | ICD-10-CM | POA: Insufficient documentation

## 2015-07-23 DIAGNOSIS — M1288 Other specific arthropathies, not elsewhere classified, other specified site: Secondary | ICD-10-CM | POA: Diagnosis not present

## 2015-07-23 DIAGNOSIS — Z9884 Bariatric surgery status: Secondary | ICD-10-CM | POA: Diagnosis not present

## 2015-07-23 MED ORDER — GABAPENTIN 300 MG PO CAPS: 600.0000 mg | ORAL_CAPSULE | Freq: Three times a day (TID) | ORAL | Status: DC

## 2015-07-23 MED ORDER — TRAMADOL HCL 50 MG PO TABS: 100.0000 mg | ORAL_TABLET | Freq: Three times a day (TID) | ORAL | Status: DC

## 2015-07-23 MED ORDER — IBUPROFEN 800 MG PO TABS: 800.0000 mg | ORAL_TABLET | Freq: Three times a day (TID) | ORAL | Status: DC | PRN

## 2015-07-23 MED ORDER — OXYCODONE-ACETAMINOPHEN 10-325 MG PO TABS: 1.0000 | ORAL_TABLET | ORAL | Status: DC | PRN

## 2015-07-23 NOTE — Progress Notes (Signed)
Safety precautions to be maintained throughout the outpatient stay will include: orient to surroundings, keep bed in low position, maintain call bell within reach at all times, provide assistance with transfer out of bed and ambulation.  

## 2015-07-25 DIAGNOSIS — Z6841 Body Mass Index (BMI) 40.0 and over, adult: Secondary | ICD-10-CM

## 2015-07-25 HISTORY — DX: Body Mass Index (BMI) 40.0 and over, adult: Z684

## 2015-07-25 HISTORY — DX: Morbid (severe) obesity due to excess calories: E66.01

## 2015-07-26 ENCOUNTER — Encounter (HOSPITAL_COMMUNITY)
Admission: RE | Admit: 2015-07-26 | Discharge: 2015-07-26 | Disposition: A | Discharge status: Home / Self Care | Payer: Medicare Other | Source: Ambulatory Visit | Attending: Orthopaedic Surgery | Admitting: Orthopaedic Surgery

## 2015-07-26 ENCOUNTER — Encounter (HOSPITAL_COMMUNITY): Payer: Self-pay

## 2015-07-26 DIAGNOSIS — I10 Essential (primary) hypertension: Secondary | ICD-10-CM | POA: Insufficient documentation

## 2015-07-26 DIAGNOSIS — Z01810 Encounter for preprocedural cardiovascular examination: Secondary | ICD-10-CM | POA: Diagnosis present

## 2015-07-26 DIAGNOSIS — M1712 Unilateral primary osteoarthritis, left knee: Secondary | ICD-10-CM | POA: Diagnosis not present

## 2015-07-26 DIAGNOSIS — R9431 Abnormal electrocardiogram [ECG] [EKG]: Secondary | ICD-10-CM | POA: Diagnosis not present

## 2015-07-26 DIAGNOSIS — Z01812 Encounter for preprocedural laboratory examination: Secondary | ICD-10-CM | POA: Diagnosis present

## 2015-07-26 HISTORY — DX: Dizziness and giddiness: R42

## 2015-07-26 HISTORY — DX: Adverse effect of unspecified anesthetic, initial encounter: T41.45XA

## 2015-07-26 HISTORY — DX: Personal history of other diseases of the respiratory system: Z87.09

## 2015-07-26 HISTORY — DX: Fibromyalgia: M79.7

## 2015-07-26 HISTORY — DX: Tinnitus, unspecified ear: H93.19

## 2015-07-26 HISTORY — DX: Anesthesia of skin: R20.0

## 2015-07-26 HISTORY — DX: Other complications of anesthesia, initial encounter: T88.59XA

## 2015-07-26 HISTORY — DX: Bariatric surgery status: Z98.84

## 2015-07-26 HISTORY — DX: Other allergy status, other than to drugs and biological substances: Z91.09

## 2015-07-26 LAB — BASIC METABOLIC PANEL
Anion gap: 6 (ref 5–15)
BUN: 15 mg/dL (ref 6–20)
CALCIUM: 9 mg/dL (ref 8.9–10.3)
CO2: 27 mmol/L (ref 22–32)
CREATININE: 0.81 mg/dL (ref 0.44–1.00)
Chloride: 105 mmol/L (ref 101–111)
GFR calc Af Amer: >60 mL/min (ref >60)
GFR calc non Af Amer: >60 mL/min (ref >60)
GLUCOSE: 95 mg/dL (ref 65–99)
Potassium: 4.9 mmol/L (ref 3.5–5.1)
Sodium: 138 mmol/L (ref 135–145)

## 2015-07-26 LAB — CBC
HCT: 37.9 % (ref 36.0–46.0)
HEMOGLOBIN: 12 g/dL (ref 12.0–15.0)
MCH: 27.2 pg (ref 26.0–34.0)
MCHC: 31.7 g/dL (ref 30.0–36.0)
MCV: 85.9 fL (ref 78.0–100.0)
PLATELETS: 318 10*3/uL (ref 150–400)
RBC: 4.41 MIL/uL (ref 3.87–5.11)
RDW: 17.2 % — ABNORMAL HIGH (ref 11.5–15.5)
WBC: 4.4 10*3/uL (ref 4.0–10.5)

## 2015-07-26 LAB — SURGICAL PCR SCREEN
MRSA, PCR: NEGATIVE
STAPHYLOCOCCUS AUREUS: NEGATIVE

## 2015-07-26 LAB — PROTIME-INR
INR: 1.11 (ref 0.00–1.49)
PROTHROMBIN TIME: 14.1 s (ref 11.6–15.2)

## 2015-07-26 NOTE — Patient Instructions (Signed)
Wendy Sanchez  07/26/2015   Your procedure is scheduled on: Friday August 02, 2015  Report to [facility] Main Main  Entrance take [place]  elevators to 3rd floor to  [place] at 8:00 AM.  Call this number if you have problems the morning of surgery [phone]   Remember: ONLY 1 PERSON MAY GO WITH YOU TO SHORT STAY TO GET  READY MORNING OF [name]  Do not eat food or drink liquids :After Midnight.     Take these medicines the morning of surgery with A SIP OF WATER: Dexlansoprazole (Dexilant); Fexofenadine (Allegra); Advair; May use albuterol inhaler if needed (bring with you day of surgery); Azelastine Nasal Spray if needed (bring with you day of surgery; May use eye drops if needed (bring with you day of surgery); Gabapentin; Oxycodone-Acetaminophen if needed;                                You may not have any metal on your body including hair pins and              piercings  Do not wear jewelry, make-up, lotions, powders or perfumes, deodorant             Do not wear nail polish.  Do not shave  48 hours prior to surgery.                 Do not bring valuables to the hospital. [name]  Contacts, dentures or bridgework may not be worn into surgery.  Leave suitcase in the car. After surgery it may be brought to your room.              Please read over the following fact sheets you were given:MRSA INFORMATION SHEET; INCENTIVE SPIROMETER;  _____________________________________________________________________             [facility] - Preparing for Surgery Before surgery, you can play an important role.  Because skin is not sterile, your skin needs to be as free of germs as possible.  You can reduce the number of germs on your skin by washing with CHG (chlorahexidine gluconate) soap before surgery.  CHG is an antiseptic cleaner which kills germs and bonds with the skin to continue killing germs even after  washing. Please DO NOT use if you have an allergy to CHG or antibacterial soaps.  If your skin becomes reddened/irritated stop using the CHG and inform your nurse when you arrive at Short Stay. Do not shave (including legs and underarms) for at least 48 hours prior to the first CHG shower.  You may shave your face/neck. Please follow these instructions carefully:  1.  Shower with CHG Soap the night before surgery and the  morning of Surgery.  2.  If you choose to wash your hair, wash your hair first as usual with your  normal  shampoo.  3.  After you shampoo, rinse your hair and body thoroughly to remove the  shampoo.                           4.  Use CHG as you would any other liquid soap.  You can apply chg directly  to the skin and wash                       Gently with a scrungie or clean washcloth.  5.  Apply the CHG Soap to your body ONLY FROM THE NECK DOWN.   Do not use on face/ open                           Wound or open sores. Avoid contact with eyes, ears mouth and genitals (private parts).                       Wash face,  Genitals (private parts) with your normal soap.             6.  Wash thoroughly, paying special attention to the area where your surgery  will be performed.  7.  Thoroughly rinse your body with warm water from the neck down.  8.  DO NOT shower/wash with your normal soap after using and rinsing off  the CHG Soap.                9.  Pat yourself dry with a clean towel.            10.  Wear clean pajamas.            11.  Place clean sheets on your bed the night of your first shower and do not  sleep with pets. Day of Surgery : Do not apply any lotions/deodorants the morning of surgery.  Please wear clean clothes to the hospital/surgery center.  FAILURE TO FOLLOW THESE INSTRUCTIONS MAY RESULT IN THE CANCELLATION OF YOUR SURGERY PATIENT SIGNATURE_________________________________  NURSE  SIGNATURE__________________________________  ________________________________________________________________________   Wendy Sanchez  An incentive spirometer is a tool that can help keep your lungs clear and active. This tool measures how well you are filling your lungs with each breath. Taking long deep breaths may help reverse or decrease the chance of developing breathing (pulmonary) problems (especially infection) following:  A long period of time when you are unable to move or be active. BEFORE THE PROCEDURE   If the spirometer includes an indicator to show your best effort, your nurse or respiratory therapist will set it to a desired goal.  If possible, sit up straight or lean slightly forward. Try not to slouch.  Hold the incentive spirometer in an upright position. INSTRUCTIONS FOR USE  1. Sit on the edge of your bed if possible, or sit up as far as you can in bed or on a chair. 2. Hold the incentive spirometer in an upright position. 3. Breathe out normally. 4. Place the mouthpiece in your mouth and seal your lips tightly around it. 5. Breathe in slowly and as deeply as possible, raising the piston or the ball toward the top of the column. 6. Hold your breath for 3-5 seconds or for as long as possible. Allow the piston or ball to fall to the bottom of the column. 7. Remove the mouthpiece from your mouth and breathe out normally. 8. Rest for a few seconds and repeat Steps 1 through 7 at least 10 times every 1-2 hours when you are awake. Take your time and take a few normal breaths between deep breaths. 9. The spirometer may include an indicator to show your best effort. Use the indicator as a goal to work toward during each repetition. 10. After each  set of 10 deep breaths, practice coughing to be sure your lungs are clear. If you have an incision (the cut made at the time of surgery), support your incision when coughing by placing a pillow or rolled up towels firmly  against it. Once you are able to get out of bed, walk around indoors and cough well. You may stop using the incentive spirometer when instructed by your caregiver.  RISKS AND COMPLICATIONS  Take your time so you do not get dizzy or light-headed.  If you are in pain, you may need to take or ask for pain medication before doing incentive spirometry. It is harder to take a deep breath if you are having pain. AFTER USE  Rest and breathe slowly and easily.  It can be helpful to keep track of a log of your progress. Your caregiver can provide you with a simple table to help with this. If you are using the spirometer at home, follow these instructions: [place] IF:   You are having difficultly using the spirometer.  You have trouble using the spirometer as often as instructed.  Your pain medication is not giving enough relief while using the spirometer.  You develop fever of 100.5 F (38.1 C) or higher. SEEK IMMEDIATE MEDICAL CARE IF:   You cough up bloody sputum that had not been present before.  You develop fever of 102 F (38.9 C) or greater.  You develop worsening pain at or near the incision site. MAKE SURE YOU:   Understand these instructions.  Will watch your condition.  Will get help right away if you are not doing well or get worse. Document Released: 05/11/2006 Document Revised: 03/23/2011 Document Reviewed: 07/12/2006 [facility] Patient Information 2014 [place], [place].   ________________________________________________________________________

## 2015-07-29 NOTE — Progress Notes (Signed)
CC:  Diffuse body pain  Procedure:  None  HPI:  Wendy Sanchez continues to report reasonable pain control with her current medication regimen. She reports today for reevaluation and has been doing well with her medication regimen. Based on her narcotic assessment sheet she is tolerating her medications without difficulty and has been compliant. She is questioning whether she might increase her OxyContin dose and is questioning about how often she can take her ibuprofen no change in the quality characteristic or distribution of her pain are mentioned today and otherwise things sound like they've been going well and have been stable.  .BP 120/72 mmHg  Pulse 95  Temp(Src) 98.1 F (36.7 C) (Oral)  Resp 20  Ht 5\' 8"  (1.727 m)  Wt 312 lb (141.522 kg)  BMI 47.45 kg/m2  SpO2 100%   Current outpatient prescriptions:  .  albuterol (PROVENTIL HFA;VENTOLIN HFA) 108 (90 BASE) MCG/ACT inhaler, Inhale 2 puffs into the lungs every 6 (six) hours as needed for wheezing or shortness of breath., Disp: , Rfl:  .  azelastine (ASTELIN) 0.1 % nasal spray, Place 1 spray into both nostrils 2 (two) times daily as needed for allergies. Use in each nostril as directed, Disp: , Rfl:  .  clotrimazole (LOTRIMIN) 1 % cream, Apply 1 application topically 2 (two) times daily as needed. prn, Disp: , Rfl:  .  cyclobenzaprine (FLEXERIL) 10 MG tablet, Take 10 mg by mouth 3 (three) times daily as needed for muscle spasms., Disp: , Rfl:  .  dexlansoprazole (DEXILANT) 60 MG capsule, Take 60 mg by mouth daily., Disp: , Rfl:  .  diclofenac sodium (VOLTAREN) 1 % GEL, Apply 2 g topically 4 (four) times daily. , Disp: , Rfl:  .  Diphenhyd-Hydrocort-Nystatin (FIRST-DUKES MOUTHWASH) SUSP, Use as directed 5 mLs in the mouth or throat 3 (three) times daily as needed (mouth sores. Dukes magic mouthwash.)., Disp: 150 mL, Rfl: 0 .  fexofenadine (ALLEGRA) 180 MG tablet, Take 180 mg by mouth daily., Disp: , Rfl:  .  Fluticasone-Salmeterol (ADVAIR)  250-50 MCG/DOSE AEPB, Inhale 1 puff into the lungs 2 (two) times daily., Disp: , Rfl:  .  folic acid (FOLVITE) 1 MG tablet, Take 1 mg by mouth daily., Disp: , Rfl:  .  furosemide (LASIX) 40 MG tablet, Take 40 mg by mouth daily. , Disp: , Rfl:  .  gabapentin (NEURONTIN) 300 MG capsule, Take 2 capsules (600 mg total) by mouth 3 (three) times daily. Takes two capsules twice a day (Patient taking differently: Take 600 mg by mouth 3 (three) times daily. ), Disp: 180 capsule, Rfl: 3 .  ibuprofen (ADVIL,MOTRIN) 800 MG tablet, Take 1 tablet (800 mg total) by mouth every 8 (eight) hours as needed. (Patient taking differently: Take 800 mg by mouth every 8 (eight) hours as needed for moderate pain. ), Disp: 90 tablet, Rfl: 5 .  mirabegron ER (MYRBETRIQ) 50 MG TB24 tablet, Take 50 mg by mouth daily., Disp: , Rfl:  .  montelukast (SINGULAIR) 10 MG tablet, Take 10 mg by mouth at bedtime., Disp: , Rfl:  .  Multiple Vitamins-Minerals (MULTIVITAMIN GUMMIES ADULTS) CHEW, Chew by mouth., Disp: , Rfl:  .  nortriptyline (PAMELOR) 50 MG capsule, Take 50 mg by mouth at bedtime., Disp: , Rfl:  .  oxyCODONE-acetaminophen (PERCOCET) 10-325 MG tablet, Take 1 tablet by mouth every 4 (four) hours as needed for pain., Disp: 90 tablet, Rfl: 0 .  potassium chloride SA (K-DUR,KLOR-CON) 20 MEQ tablet, Take 20 mEq by mouth 3 (three)  times a week. Monday, Wednesday, and Saturday, Disp: , Rfl:  .  Probiotic Product (PROBIOTIC COLON SUPPORT) CAPS, Take 1 capsule by mouth at bedtime., Disp: , Rfl:  .  ramipril (ALTACE) 10 MG capsule, Take 10 mg by mouth 2 (two) times daily., Disp: , Rfl:  .  sucralfate (CARAFATE) 1 G tablet, Take 1 g by mouth 2 (two) times daily., Disp: , Rfl:  .  traMADol (ULTRAM) 50 MG tablet, Take 2 tablets (100 mg total) by mouth 3 (three) times daily between meals. (Patient taking differently: Take 50 mg by mouth 3 (three) times daily between meals. ), Disp: 90 tablet, Rfl: 3 .  triamterene-hydrochlorothiazide  (MAXZIDE) 75-50 MG tablet, Take 1 tablet by mouth daily as needed (edema). , Disp: , Rfl:  .  Vitamin D, Ergocalciferol, (DRISDOL) 50000 UNITS CAPS capsule, Take 50,000 Units by mouth every 7 (seven) days. thursdays, Disp: , Rfl:  .  fluconazole (DIFLUCAN) 150 MG tablet, Take 150 mg by mouth once. Pt has currently not taken but does have prescription if needed (prescription for 2 tabs) Instructions 1 tab, then repeat 1 wk - pt states if begins to itch can begin medication, Disp: , Rfl:  .  metroNIDAZOLE (FLAGYL) 500 MG tablet, Take 1,000 mg by mouth daily. Pt states has completed prescription 07/24/2015, Disp: , Rfl:  .  omega-3 acid ethyl esters (LOVAZA) 1 g capsule, Take 1 g by mouth 2 (two) times a week. Monday and thursdays, Disp: , Rfl:  .  OVER THE COUNTER MEDICATION, Place 1 drop into both eyes daily as needed (dry eyes and allergies)., Disp: , Rfl:   Patient Active Problem List   Diagnosis Date Noted  . Spinal stenosis of lumbar region 06/19/2015  . Chronic pain syndrome 10/30/2014  's medical history significant for essential hypertension  Hyperlipidemia  Asthma Seasonal allergies Hemorrhoids  history of migraines  and obesity  generalized abdominal pain Osteoarthritis  Chickenpox  Cigarette smoking  Past surgeries include abdominal hysterectomy with partial vaginectomy  Gastric bypass 7  Rotator cuff repair  Left wrist surgery  Laparoscopy with gastroplasty and vertical banding for obesity       Allergies  Allergen Reactions  . Other Diarrhea and Swelling    Other reaction(s): Abdominal Pain CLAM CHOWDER   ALLERGY TO METAL - BLACKENS SKIN AND CAUSES A RASH   . Tape Swelling  . Iodine     family history significant for colon polyps hepatitis C and lupus  Physical exam:    PERRL, EOMI  Alert and Ox3  H:  RRR   Lungs:  CTA Inspection of the low back reveals paraspinous muscle tenderness but no overt trigger points. She ambulates well with no  evidence of any significant lower extremity weakness. She does have tenderness about the knees low back and hips with motion. She also walks with an antalgic gait and requires a walker for assistance.And no other changes on examination today  Assessment:  1. 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  Sleep apnea   5.  Chronic narcotic usage for generalized pain   Plan:    1. We'll refill her medications with return to clinic in 2 month.   2 We talked about her need to proceed with efforts at continued weight loss and continue with physical  therapy exercises and continue follow-up with her primary care. We will refill her  medications today. Furthermore I will allow her to take the Ultram instead of an additional OxyContin tablet for breakthrough pain. We have also talked about usage of the ibuprofen approximately 3-4 days per week and risks factors associated with chronic NSAIDs.  3. She would be a candidate for an epidural steroid injection to see if this could diminish some of the chronic low back pain with bilateral hip pain she has been experiencing and we will plan for this at her next visit. Dr. Vashti Hey MD 10:43 AM

## 2015-08-01 MED ORDER — CEFAZOLIN SODIUM 10 G IJ SOLR
3.0000 g | INTRAMUSCULAR | Status: AC
  Administered 2015-08-02: 3 g via INTRAVENOUS
  Filled 2015-08-01: qty 3

## 2015-08-02 ENCOUNTER — Inpatient Hospital Stay (HOSPITAL_COMMUNITY)
Admission: RE | Admit: 2015-08-02 | Discharge: 2015-08-06 | DRG: 470 | Disposition: A | Discharge status: Home Health | Payer: Medicare Other | Source: Ambulatory Visit | Attending: Orthopaedic Surgery | Admitting: Orthopaedic Surgery

## 2015-08-02 ENCOUNTER — Inpatient Hospital Stay (HOSPITAL_COMMUNITY): Payer: Medicare Other | Admitting: Registered Nurse

## 2015-08-02 ENCOUNTER — Inpatient Hospital Stay (HOSPITAL_COMMUNITY): Payer: Medicare Other

## 2015-08-02 ENCOUNTER — Encounter (HOSPITAL_COMMUNITY)
Admission: RE | Disposition: A | Discharge status: Home Health | Payer: Self-pay | Source: Ambulatory Visit | Attending: Orthopaedic Surgery

## 2015-08-02 ENCOUNTER — Encounter (HOSPITAL_COMMUNITY): Payer: Self-pay | Admitting: Registered Nurse

## 2015-08-02 DIAGNOSIS — I10 Essential (primary) hypertension: Secondary | ICD-10-CM | POA: Diagnosis present

## 2015-08-02 DIAGNOSIS — Z6841 Body Mass Index (BMI) 40.0 and over, adult: Secondary | ICD-10-CM | POA: Diagnosis not present

## 2015-08-02 DIAGNOSIS — M4806 Spinal stenosis, lumbar region: Secondary | ICD-10-CM | POA: Diagnosis present

## 2015-08-02 DIAGNOSIS — K219 Gastro-esophageal reflux disease without esophagitis: Secondary | ICD-10-CM | POA: Diagnosis present

## 2015-08-02 DIAGNOSIS — G894 Chronic pain syndrome: Secondary | ICD-10-CM | POA: Diagnosis present

## 2015-08-02 DIAGNOSIS — Z96652 Presence of left artificial knee joint: Secondary | ICD-10-CM

## 2015-08-02 DIAGNOSIS — F17210 Nicotine dependence, cigarettes, uncomplicated: Secondary | ICD-10-CM | POA: Diagnosis present

## 2015-08-02 DIAGNOSIS — Z90710 Acquired absence of both cervix and uterus: Secondary | ICD-10-CM

## 2015-08-02 DIAGNOSIS — M797 Fibromyalgia: Secondary | ICD-10-CM | POA: Diagnosis present

## 2015-08-02 DIAGNOSIS — M1712 Unilateral primary osteoarthritis, left knee: Secondary | ICD-10-CM

## 2015-08-02 DIAGNOSIS — M25562 Pain in left knee: Secondary | ICD-10-CM | POA: Diagnosis present

## 2015-08-02 DIAGNOSIS — Z9884 Bariatric surgery status: Secondary | ICD-10-CM | POA: Diagnosis not present

## 2015-08-02 HISTORY — DX: Presence of left artificial knee joint: Z96.652

## 2015-08-02 HISTORY — DX: Unilateral primary osteoarthritis, left knee: M17.12

## 2015-08-02 HISTORY — PX: TOTAL KNEE ARTHROPLASTY: SHX125

## 2015-08-02 SURGERY — ARTHROPLASTY, KNEE, TOTAL
Anesthesia: Monitor Anesthesia Care | Site: Knee | Laterality: Left

## 2015-08-02 MED ORDER — DEXAMETHASONE SODIUM PHOSPHATE 10 MG/ML IJ SOLN
INTRAMUSCULAR | Status: DC | PRN
  Administered 2015-08-02: 10 mg via INTRAVENOUS

## 2015-08-02 MED ORDER — HYDROMORPHONE HCL 1 MG/ML IJ SOLN
1.0000 mg | INTRAMUSCULAR | Status: DC | PRN
  Administered 2015-08-02 – 2015-08-06 (×6): 1 mg via INTRAVENOUS
  Filled 2015-08-02 (×6): qty 1

## 2015-08-02 MED ORDER — TRIAMTERENE-HCTZ 75-50 MG PO TABS
1.0000 | ORAL_TABLET | Freq: Every day | ORAL | Status: DC | PRN
  Filled 2015-08-02: qty 1

## 2015-08-02 MED ORDER — FUROSEMIDE 40 MG PO TABS
40.0000 mg | ORAL_TABLET | Freq: Every day | ORAL | Status: DC
  Administered 2015-08-02 – 2015-08-05 (×4): 40 mg via ORAL
  Filled 2015-08-02 (×5): qty 1

## 2015-08-02 MED ORDER — PROPOFOL 10 MG/ML IV BOLUS
INTRAVENOUS | Status: AC
  Filled 2015-08-02: qty 60

## 2015-08-02 MED ORDER — NORTRIPTYLINE HCL 25 MG PO CAPS
50.0000 mg | ORAL_CAPSULE | Freq: Every day | ORAL | Status: DC
  Administered 2015-08-02 – 2015-08-05 (×4): 50 mg via ORAL
  Filled 2015-08-02 (×4): qty 2

## 2015-08-02 MED ORDER — BUPIVACAINE LIPOSOME 1.3 % IJ SUSP
20.0000 mL | Freq: Once | INTRAMUSCULAR | Status: AC
  Administered 2015-08-02: 20 mL
  Filled 2015-08-02: qty 20

## 2015-08-02 MED ORDER — PROPOFOL 10 MG/ML IV BOLUS
INTRAVENOUS | Status: AC
  Filled 2015-08-02: qty 20

## 2015-08-02 MED ORDER — MIDAZOLAM HCL 2 MG/2ML IJ SOLN
INTRAMUSCULAR | Status: AC
  Filled 2015-08-02: qty 2

## 2015-08-02 MED ORDER — BUPIVACAINE HCL (PF) 0.25 % IJ SOLN
INTRAMUSCULAR | Status: AC
  Filled 2015-08-02: qty 30

## 2015-08-02 MED ORDER — POTASSIUM CHLORIDE CRYS ER 20 MEQ PO TBCR
20.0000 meq | EXTENDED_RELEASE_TABLET | ORAL | Status: DC
  Administered 2015-08-03 – 2015-08-05 (×2): 20 meq via ORAL
  Filled 2015-08-02 (×2): qty 1

## 2015-08-02 MED ORDER — ACETAMINOPHEN 10 MG/ML IV SOLN
INTRAVENOUS | Status: DC | PRN
  Administered 2015-08-02: 1000 mg via INTRAVENOUS

## 2015-08-02 MED ORDER — ACETAMINOPHEN 10 MG/ML IV SOLN: 1000.0000 mg | Freq: Once | INTRAVENOUS | Status: DC

## 2015-08-02 MED ORDER — MOMETASONE FURO-FORMOTEROL FUM 200-5 MCG/ACT IN AERO
2.0000 | INHALATION_SPRAY | Freq: Two times a day (BID) | RESPIRATORY_TRACT | Status: DC
  Administered 2015-08-02 – 2015-08-06 (×8): 2 via RESPIRATORY_TRACT
  Filled 2015-08-02: qty 8.8

## 2015-08-02 MED ORDER — PROBIOTIC COLON SUPPORT PO CAPS: 1.0000 | ORAL_CAPSULE | Freq: Every day | ORAL | Status: DC

## 2015-08-02 MED ORDER — CYCLOBENZAPRINE HCL 10 MG PO TABS
10.0000 mg | ORAL_TABLET | Freq: Three times a day (TID) | ORAL | Status: DC | PRN
  Administered 2015-08-02 – 2015-08-03 (×2): 10 mg via ORAL
  Filled 2015-08-02 (×2): qty 1

## 2015-08-02 MED ORDER — ONDANSETRON HCL 4 MG PO TABS: 4.0000 mg | ORAL_TABLET | Freq: Four times a day (QID) | ORAL | Status: DC | PRN

## 2015-08-02 MED ORDER — ACETAMINOPHEN 325 MG PO TABS
650.0000 mg | ORAL_TABLET | Freq: Four times a day (QID) | ORAL | Status: DC | PRN
  Administered 2015-08-05: 650 mg via ORAL
  Filled 2015-08-02: qty 2

## 2015-08-02 MED ORDER — HYDROMORPHONE HCL 1 MG/ML IJ SOLN
0.2500 mg | INTRAMUSCULAR | Status: DC | PRN
  Administered 2015-08-02 (×4): 0.5 mg via INTRAVENOUS

## 2015-08-02 MED ORDER — SUCRALFATE 1 G PO TABS
1.0000 g | ORAL_TABLET | Freq: Two times a day (BID) | ORAL | Status: DC
  Administered 2015-08-02 – 2015-08-06 (×8): 1 g via ORAL
  Filled 2015-08-02 (×8): qty 1

## 2015-08-02 MED ORDER — RAMIPRIL 10 MG PO CAPS
10.0000 mg | ORAL_CAPSULE | Freq: Two times a day (BID) | ORAL | Status: DC
  Administered 2015-08-02 – 2015-08-05 (×3): 10 mg via ORAL
  Filled 2015-08-02 (×8): qty 1

## 2015-08-02 MED ORDER — HYDROMORPHONE HCL 1 MG/ML IJ SOLN
INTRAMUSCULAR | Status: AC
  Filled 2015-08-02: qty 1

## 2015-08-02 MED ORDER — MONTELUKAST SODIUM 10 MG PO TABS
10.0000 mg | ORAL_TABLET | Freq: Every day | ORAL | Status: DC
  Administered 2015-08-02 – 2015-08-05 (×4): 10 mg via ORAL
  Filled 2015-08-02 (×4): qty 1

## 2015-08-02 MED ORDER — ACETAMINOPHEN 650 MG RE SUPP: 650.0000 mg | Freq: Four times a day (QID) | RECTAL | Status: DC | PRN

## 2015-08-02 MED ORDER — GABAPENTIN 300 MG PO CAPS
600.0000 mg | ORAL_CAPSULE | Freq: Three times a day (TID) | ORAL | Status: DC
  Administered 2015-08-02 – 2015-08-06 (×12): 600 mg via ORAL
  Filled 2015-08-02 (×12): qty 2

## 2015-08-02 MED ORDER — ACETAMINOPHEN 10 MG/ML IV SOLN
INTRAVENOUS | Status: AC
  Filled 2015-08-02: qty 100

## 2015-08-02 MED ORDER — POLYETHYLENE GLYCOL 3350 17 G PO PACK
17.0000 g | PACK | Freq: Every day | ORAL | Status: DC | PRN
  Administered 2015-08-05: 17 g via ORAL
  Filled 2015-08-02: qty 1

## 2015-08-02 MED ORDER — PROPOFOL 500 MG/50ML IV EMUL
INTRAVENOUS | Status: DC | PRN
  Administered 2015-08-02: 100 ug/kg/min via INTRAVENOUS

## 2015-08-02 MED ORDER — CEFAZOLIN SODIUM-DEXTROSE 2-4 GM/100ML-% IV SOLN
2.0000 g | Freq: Four times a day (QID) | INTRAVENOUS | Status: AC
  Administered 2015-08-02 (×2): 2 g via INTRAVENOUS
  Filled 2015-08-02 (×2): qty 100

## 2015-08-02 MED ORDER — DEXAMETHASONE SODIUM PHOSPHATE 10 MG/ML IJ SOLN
INTRAMUSCULAR | Status: AC
  Filled 2015-08-02: qty 1

## 2015-08-02 MED ORDER — ONDANSETRON HCL 4 MG/2ML IJ SOLN
INTRAMUSCULAR | Status: AC
  Filled 2015-08-02: qty 2

## 2015-08-02 MED ORDER — SACCHAROMYCES BOULARDII 250 MG PO CAPS
250.0000 mg | ORAL_CAPSULE | Freq: Every day | ORAL | Status: DC
  Administered 2015-08-02 – 2015-08-05 (×4): 250 mg via ORAL
  Filled 2015-08-02 (×4): qty 1

## 2015-08-02 MED ORDER — MIRABEGRON ER 50 MG PO TB24
50.0000 mg | ORAL_TABLET | Freq: Every day | ORAL | Status: DC
  Administered 2015-08-03 – 2015-08-06 (×4): 50 mg via ORAL
  Filled 2015-08-02 (×4): qty 1

## 2015-08-02 MED ORDER — FOLIC ACID 1 MG PO TABS
1.0000 mg | ORAL_TABLET | Freq: Every day | ORAL | Status: DC
  Administered 2015-08-02 – 2015-08-06 (×5): 1 mg via ORAL
  Filled 2015-08-02 (×5): qty 1

## 2015-08-02 MED ORDER — PHENOL 1.4 % MT LIQD
1.0000 | OROMUCOSAL | Status: DC | PRN
Start: 2015-08-02 — End: 2015-08-06

## 2015-08-02 MED ORDER — DIPHENHYDRAMINE HCL 12.5 MG/5ML PO ELIX: 12.5000 mg | ORAL_SOLUTION | ORAL | Status: DC | PRN

## 2015-08-02 MED ORDER — ONDANSETRON HCL 4 MG/2ML IJ SOLN: 4.0000 mg | Freq: Four times a day (QID) | INTRAMUSCULAR | Status: DC | PRN

## 2015-08-02 MED ORDER — PROMETHAZINE HCL 25 MG/ML IJ SOLN: 6.2500 mg | INTRAMUSCULAR | Status: DC | PRN

## 2015-08-02 MED ORDER — SODIUM CHLORIDE 0.9 % IV SOLN
1000.0000 mg | INTRAVENOUS | Status: AC
  Administered 2015-08-02: 1000 mg via INTRAVENOUS
  Filled 2015-08-02: qty 10

## 2015-08-02 MED ORDER — DEXTROSE 5 % IV SOLN
10.0000 mg | INTRAVENOUS | Status: DC | PRN
  Administered 2015-08-02: 35 ug/min via INTRAVENOUS

## 2015-08-02 MED ORDER — ONDANSETRON HCL 4 MG/2ML IJ SOLN
INTRAMUSCULAR | Status: DC | PRN
  Administered 2015-08-02: 4 mg via INTRAVENOUS

## 2015-08-02 MED ORDER — HYDROMORPHONE HCL 1 MG/ML IJ SOLN
INTRAMUSCULAR | Status: AC
  Filled 2015-08-02: qty 2

## 2015-08-02 MED ORDER — MIDAZOLAM HCL 5 MG/5ML IJ SOLN
INTRAMUSCULAR | Status: DC | PRN
  Administered 2015-08-02 (×2): 2 mg via INTRAVENOUS

## 2015-08-02 MED ORDER — ASPIRIN EC 325 MG PO TBEC
325.0000 mg | DELAYED_RELEASE_TABLET | Freq: Two times a day (BID) | ORAL | Status: DC
  Administered 2015-08-02 – 2015-08-06 (×8): 325 mg via ORAL
  Filled 2015-08-02 (×8): qty 1

## 2015-08-02 MED ORDER — SODIUM CHLORIDE 0.9 % IJ SOLN
INTRAMUSCULAR | Status: DC | PRN
  Administered 2015-08-02: 20 mL

## 2015-08-02 MED ORDER — STERILE WATER FOR IRRIGATION IR SOLN
Status: DC | PRN
  Administered 2015-08-02: 2000 mL

## 2015-08-02 MED ORDER — MENTHOL 3 MG MT LOZG
1.0000 | LOZENGE | OROMUCOSAL | Status: DC | PRN
Start: 2015-08-02 — End: 2015-08-06

## 2015-08-02 MED ORDER — CHLORHEXIDINE GLUCONATE 4 % EX LIQD: 60.0000 mL | Freq: Once | CUTANEOUS | Status: DC

## 2015-08-02 MED ORDER — PANTOPRAZOLE SODIUM 40 MG PO TBEC
40.0000 mg | DELAYED_RELEASE_TABLET | Freq: Every day | ORAL | Status: DC
Start: 2015-08-02 — End: 2015-08-06
  Administered 2015-08-02 – 2015-08-06 (×5): 40 mg via ORAL
  Filled 2015-08-02 (×5): qty 1

## 2015-08-02 MED ORDER — FENTANYL CITRATE (PF) 100 MCG/2ML IJ SOLN
INTRAMUSCULAR | Status: DC | PRN
  Administered 2015-08-02: 100 ug via INTRAVENOUS

## 2015-08-02 MED ORDER — METOCLOPRAMIDE HCL 5 MG/ML IJ SOLN: 5.0000 mg | Freq: Three times a day (TID) | INTRAMUSCULAR | Status: DC | PRN

## 2015-08-02 MED ORDER — HYDROMORPHONE HCL 1 MG/ML IJ SOLN
0.2500 mg | INTRAMUSCULAR | Status: DC | PRN
Start: 2015-08-02 — End: 2015-08-02
  Administered 2015-08-02 (×4): 0.5 mg via INTRAVENOUS

## 2015-08-02 MED ORDER — SODIUM CHLORIDE 0.9 % IR SOLN
Status: DC | PRN
  Administered 2015-08-02: 2000 mL

## 2015-08-02 MED ORDER — LACTATED RINGERS IV SOLN
INTRAVENOUS | Status: DC | PRN
  Administered 2015-08-02 (×3): via INTRAVENOUS

## 2015-08-02 MED ORDER — SODIUM CHLORIDE 0.9 % IV SOLN
INTRAVENOUS | Status: DC
  Administered 2015-08-02 – 2015-08-03 (×2): via INTRAVENOUS

## 2015-08-02 MED ORDER — BUPIVACAINE HCL 0.25 % IJ SOLN
INTRAMUSCULAR | Status: DC | PRN
  Administered 2015-08-02: 20 mL

## 2015-08-02 MED ORDER — BUPIVACAINE IN DEXTROSE 0.75-8.25 % IT SOLN
INTRATHECAL | Status: DC | PRN
  Administered 2015-08-02: 15 mg via INTRATHECAL

## 2015-08-02 MED ORDER — FENTANYL CITRATE (PF) 100 MCG/2ML IJ SOLN
INTRAMUSCULAR | Status: AC
  Filled 2015-08-02: qty 2

## 2015-08-02 MED ORDER — ALUM & MAG HYDROXIDE-SIMETH 200-200-20 MG/5ML PO SUSP: 30.0000 mL | ORAL | Status: DC | PRN

## 2015-08-02 MED ORDER — LIP MEDEX EX OINT
TOPICAL_OINTMENT | CUTANEOUS | Status: AC
  Filled 2015-08-02: qty 7

## 2015-08-02 MED ORDER — SODIUM CHLORIDE 0.9 % IJ SOLN
INTRAMUSCULAR | Status: AC
  Filled 2015-08-02: qty 20

## 2015-08-02 MED ORDER — ALBUTEROL SULFATE (2.5 MG/3ML) 0.083% IN NEBU: 3.0000 mL | INHALATION_SOLUTION | Freq: Four times a day (QID) | RESPIRATORY_TRACT | Status: DC | PRN

## 2015-08-02 MED ORDER — DOCUSATE SODIUM 100 MG PO CAPS
100.0000 mg | ORAL_CAPSULE | Freq: Two times a day (BID) | ORAL | Status: DC
  Administered 2015-08-02 – 2015-08-06 (×8): 100 mg via ORAL
  Filled 2015-08-02 (×8): qty 1

## 2015-08-02 MED ORDER — METOCLOPRAMIDE HCL 5 MG PO TABS: 5.0000 mg | ORAL_TABLET | Freq: Three times a day (TID) | ORAL | Status: DC | PRN

## 2015-08-02 MED ORDER — SODIUM CHLORIDE 0.9 % IR SOLN
Status: DC | PRN
  Administered 2015-08-02: 1000 mL

## 2015-08-02 MED ORDER — OXYCODONE HCL 5 MG PO TABS
5.0000 mg | ORAL_TABLET | ORAL | Status: DC | PRN
Start: 2015-08-02 — End: 2015-08-06
  Administered 2015-08-02 (×2): 10 mg via ORAL
  Administered 2015-08-03: 15 mg via ORAL
  Administered 2015-08-03 (×2): 10 mg via ORAL
  Administered 2015-08-03: 15 mg via ORAL
  Administered 2015-08-03 – 2015-08-04 (×3): 10 mg via ORAL
  Administered 2015-08-05 – 2015-08-06 (×6): 15 mg via ORAL
  Filled 2015-08-02 (×2): qty 3
  Filled 2015-08-02: qty 2
  Filled 2015-08-02: qty 3
  Filled 2015-08-02 (×2): qty 2
  Filled 2015-08-02: qty 3
  Filled 2015-08-02 (×2): qty 2
  Filled 2015-08-02 (×3): qty 3
  Filled 2015-08-02: qty 2
  Filled 2015-08-02 (×2): qty 3

## 2015-08-02 SURGICAL SUPPLY — 55 items
BAG ZIPLOCK 12X15 (MISCELLANEOUS) ×3 IMPLANT
BANDAGE ACE 6X5 VEL STRL LF (GAUZE/BANDAGES/DRESSINGS) ×3 IMPLANT
BLADE SAG 13.0X1.37X90 (BLADE) ×3 IMPLANT
BOWL SMART MIX CTS (DISPOSABLE) ×3 IMPLANT
CAPT KNEE TOTAL 3 ×3 IMPLANT
CEMENT BONE SIMPLEX SPEEDSET (Cement) ×6 IMPLANT
CHLORAPREP W/TINT 26ML (MISCELLANEOUS) ×3 IMPLANT
CLOTH BEACON ORANGE TIMEOUT ST (SAFETY) ×3 IMPLANT
CUFF TOURN SGL QUICK 44 (TOURNIQUET CUFF) ×3 IMPLANT
DRAPE INCISE 23X17 IOBAN STRL (DRAPES)
DRAPE INCISE IOBAN 23X17 STRL (DRAPES) IMPLANT
DRAPE U-SHAPE 47X51 STRL (DRAPES) ×3 IMPLANT
DRSG PAD ABDOMINAL 8X10 ST (GAUZE/BANDAGES/DRESSINGS) ×3 IMPLANT
ELECT REM PT RETURN 9FT ADLT (ELECTROSURGICAL) ×3
ELECTRODE REM PT RTRN 9FT ADLT (ELECTROSURGICAL) ×1 IMPLANT
GAUZE SPONGE 4X4 12PLY STRL (GAUZE/BANDAGES/DRESSINGS) ×3 IMPLANT
GAUZE XEROFORM 1X8 LF (GAUZE/BANDAGES/DRESSINGS) ×3 IMPLANT
GLOVE BIO SURGEON STRL SZ7.5 (GLOVE) ×3 IMPLANT
GLOVE BIOGEL PI IND STRL 7.0 (GLOVE) ×1 IMPLANT
GLOVE BIOGEL PI IND STRL 7.5 (GLOVE) ×2 IMPLANT
GLOVE BIOGEL PI IND STRL 8 (GLOVE) ×4 IMPLANT
GLOVE BIOGEL PI INDICATOR 7.0 (GLOVE) ×2
GLOVE BIOGEL PI INDICATOR 7.5 (GLOVE) ×4
GLOVE BIOGEL PI INDICATOR 8 (GLOVE) ×8
GLOVE ECLIPSE 8.0 STRL XLNG CF (GLOVE) ×6 IMPLANT
GLOVE SS PI 9.0 STRL (GLOVE) ×6 IMPLANT
GLOVE SURG SS PI 7.0 STRL IVOR (GLOVE) ×3 IMPLANT
GOWN SPEC L3 XXLG W/TWL (GOWN DISPOSABLE) ×3 IMPLANT
GOWN STRL REUS W/TWL 2XL LVL3 (GOWN DISPOSABLE) ×3 IMPLANT
GOWN STRL REUS W/TWL LRG LVL3 (GOWN DISPOSABLE) ×3 IMPLANT
GOWN STRL REUS W/TWL XL LVL3 (GOWN DISPOSABLE) ×6 IMPLANT
HANDPIECE INTERPULSE COAX TIP (DISPOSABLE) ×2
IMMOBILIZER KNEE 20 (SOFTGOODS) ×3
IMMOBILIZER KNEE 20 THIGH 36 (SOFTGOODS) ×1 IMPLANT
PACK TOTAL KNEE CUSTOM (KITS) ×3 IMPLANT
PAD ABD 8X10 STRL (GAUZE/BANDAGES/DRESSINGS) ×3 IMPLANT
PADDING CAST COTTON 6X4 STRL (CAST SUPPLIES) ×6 IMPLANT
POSITIONER SURGICAL ARM (MISCELLANEOUS) ×3 IMPLANT
SET HNDPC FAN SPRY TIP SCT (DISPOSABLE) ×1 IMPLANT
SET PAD KNEE POSITIONER (MISCELLANEOUS) ×3 IMPLANT
STAPLER VISISTAT 35W (STAPLE) ×3 IMPLANT
SUCTION FRAZIER HANDLE 12FR (TUBING) ×2
SUCTION TUBE FRAZIER 12FR DISP (TUBING) ×1 IMPLANT
SUT MNCRL AB 4-0 PS2 18 (SUTURE) IMPLANT
SUT VIC AB 0 CT1 27 (SUTURE) ×2
SUT VIC AB 0 CT1 27XBRD ANTBC (SUTURE) ×1 IMPLANT
SUT VIC AB 0 CT1 36 (SUTURE) ×3 IMPLANT
SUT VIC AB 1 CT1 27 (SUTURE) ×4
SUT VIC AB 1 CT1 27XBRD ANTBC (SUTURE) ×2 IMPLANT
SUT VIC AB 1 CT1 36 (SUTURE) ×3 IMPLANT
SUT VIC AB 2-0 CT1 27 (SUTURE) ×4
SUT VIC AB 2-0 CT1 TAPERPNT 27 (SUTURE) ×2 IMPLANT
TRAY FOLEY W/METER SILVER 14FR (SET/KITS/TRAYS/PACK) ×3 IMPLANT
WRAP KNEE MAXI GEL POST OP (GAUZE/BANDAGES/DRESSINGS) ×3 IMPLANT
YANKAUER SUCT BULB TIP 10FT TU (MISCELLANEOUS) ×3 IMPLANT

## 2015-08-02 NOTE — H&P (Signed)
TOTAL KNEE ADMISSION H&P  Patient is being admitted for left total knee arthroplasty.  Subjective:  Chief Complaint:left knee pain.  HPI: Wendy Sanchez, 65 y.o. female, has a history of pain and functional disability in the left knee due to arthritis and has failed non-surgical conservative treatments for greater than 12 weeks to includeNSAID's and/or analgesics, corticosteriod injections, viscosupplementation injections, flexibility and strengthening excercises, use of assistive devices, weight reduction as appropriate and activity modification.  Onset of symptoms was gradual, starting 5 years ago with gradually worsening course since that time. The patient noted prior procedures on the knee to include  arthroscopy on the left knee(s).  Patient currently rates pain in the left knee(s) at 10 out of 10 with activity. Patient has night pain, worsening of pain with activity and weight bearing, pain that interferes with activities of daily living, pain with passive range of motion, crepitus and joint swelling.  Patient has evidence of subchondral cysts, subchondral sclerosis, periarticular osteophytes and joint space narrowing by imaging studies. There is no active infection.  Patient Active Problem List   Diagnosis Date Noted  . Osteoarthritis of left knee 08/02/2015  . Spinal stenosis of lumbar region 06/19/2015  . Chronic pain syndrome 10/30/2014   Past Medical History  Diagnosis Date  . Hypertension   . GERD (gastroesophageal reflux disease)   . Arthritis   . Allergic rhinitis   . Complication of anesthesia     awakens during surgery; has occurred with last 3-4 surgeries   . Numbness     hands bilat when driving; improves when not preforming task   . Tinnitus     comes and goes   . Vertigo     6 months ago approx   . Environmental allergies     fumes   . History of bronchitis   . Status post gastric banding   . Fibromyalgia     Past Surgical History  Procedure Laterality Date   . Abdominal hysterectomy  1979    left ovary remains  . Ganglion cyst excision    . Knee surgery Bilateral     both knees  2001  . Abdominal hysterectomy    . Tonsillectomy      age 19  . Fibrous tissue removed from right shoulder and back of neck       2 years ago     No prescriptions prior to admission   Allergies  Allergen Reactions  . Other Diarrhea and Swelling    Other reaction(s): Abdominal Pain CLAM CHOWDER   ALLERGY TO METAL - BLACKENS SKIN AND CAUSES A RASH   . Tape Swelling  . Iodine     Social History  Substance Use Topics  . Smoking status: Current Every Day Smoker -- 0.25 packs/day for 50 years    Types: Cigarettes  . Smokeless tobacco: Former Systems developer    Types: Snuff, Chew    Quit date: 01/13/1995  . Alcohol Use: No    Family History  Problem Relation Age of Onset  . Hypertension Father   . Deep vein thrombosis Father   . Dementia Mother   . Diabetes Sister   . Congestive Heart Failure Sister   . Congestive Heart Failure Brother   . Congestive Heart Failure Daughter   . Congestive Heart Failure Son   . Breast cancer Maternal Aunt     great aunt     Review of Systems  Musculoskeletal: Positive for back pain and joint pain.  All other systems  reviewed and are negative.   Objective:  Physical Exam  Constitutional: She is oriented to person, place, and time. She appears well-developed and well-nourished.  HENT:  Head: Normocephalic.  Eyes: EOM are normal. Pupils are equal, round, and reactive to light.  Neck: Normal range of motion. Neck supple.  Cardiovascular: Normal rate and regular rhythm.   Respiratory: Effort normal and breath sounds normal.  GI: Soft. Bowel sounds are normal.  Musculoskeletal:       Left knee: She exhibits decreased range of motion, effusion, abnormal alignment and bony tenderness. Tenderness found. Medial joint line and lateral joint line tenderness noted.  Neurological: She is alert and oriented to person, place, and  time.  Skin: Skin is warm and dry.  Psychiatric: She has a normal mood and affect.    Vital signs in last 24 hours:    Labs:   Estimated body mass index is 48.21 kg/(m^2) as calculated from the following:   Height as of 07/05/15: 5\' 8"  (1.727 m).   Weight as of 07/05/15: 143.79 kg (317 lb).   Imaging Review Plain radiographs demonstrate severe degenerative joint disease of the left knee(s). The overall alignment ismild varus. The bone quality appears to be good for age and reported activity level.  Assessment/Plan:  End stage arthritis, left knee   The patient history, physical examination, clinical judgment of the provider and imaging studies are consistent with end stage degenerative joint disease of the left knee(s) and total knee arthroplasty is deemed medically necessary. The treatment options including medical management, injection therapy arthroscopy and arthroplasty were discussed at length. The risks and benefits of total knee arthroplasty were presented and reviewed. The risks due to aseptic loosening, infection, stiffness, patella tracking problems, thromboembolic complications and other imponderables were discussed. The patient acknowledged the explanation, agreed to proceed with the plan and consent was signed. Patient is being admitted for inpatient treatment for surgery, pain control, PT, OT, prophylactic antibiotics, VTE prophylaxis, progressive ambulation and ADL's and discharge planning. The patient is planning to be discharged home with home health services

## 2015-08-02 NOTE — Anesthesia Procedure Notes (Signed)
Spinal Patient location during procedure: OR Start time: 08/02/2015 11:40 AM End time: 08/02/2015 11:47 AM Staffing Anesthesiologist: Duane Boston Performed by: anesthesiologist  Preanesthetic Checklist Completed: patient identified, surgical consent, pre-op evaluation, timeout performed, IV checked, risks and benefits discussed and monitors and equipment checked Spinal Block Patient position: sitting Prep: DuraPrep Patient monitoring: cardiac monitor, continuous pulse ox and blood pressure Approach: midline Location: L2-3 Injection technique: single-shot Needle Needle type: Pencan  Needle gauge: 24 G Needle length: 9 cm Additional Notes Functioning IV was confirmed and monitors were applied. Sterile prep and drape, including hand hygiene and sterile gloves were used. The patient was positioned and the spine was prepped. The skin was anesthetized with lidocaine.  Free flow of clear CSF was obtained prior to injecting local anesthetic into the CSF.  The spinal needle aspirated freely following injection.  The needle was carefully withdrawn.  The patient tolerated the procedure well.

## 2015-08-02 NOTE — Anesthesia Preprocedure Evaluation (Addendum)
Anesthesia Evaluation  Patient identified by MRN, date of birth, ID band Patient awake    Reviewed: Allergy & Precautions, NPO status , Patient's Chart, lab work & pertinent test results  History of Anesthesia Complications (+) AWARENESS UNDER ANESTHESIA  Airway Mallampati: I  TM Distance: >3 FB Neck ROM: Full    Dental  (+) Edentulous Upper, Missing, Dental Advisory Given   Pulmonary Current Smoker,    Pulmonary exam normal        Cardiovascular hypertension, Normal cardiovascular exam     Neuro/Psych negative neurological ROS  negative psych ROS   GI/Hepatic Neg liver ROS, GERD  ,  Endo/Other  Morbid obesity  Renal/GU negative Renal ROS     Musculoskeletal  (+) Fibromyalgia -  Abdominal   Peds  Hematology   Anesthesia Other Findings   Reproductive/Obstetrics                            Anesthesia Physical Anesthesia Plan  ASA: III  Anesthesia Plan: MAC and Spinal   Post-op Pain Management:    Induction:   Airway Management Planned: Simple Face Mask and Natural Airway  Additional Equipment:   Intra-op Plan:   Post-operative Plan:   Informed Consent: I have reviewed the patients History and Physical, chart, labs and discussed the procedure including the risks, benefits and alternatives for the proposed anesthesia with the patient or authorized representative who has indicated his/her understanding and acceptance.   Dental advisory given  Plan Discussed with: CRNA, Anesthesiologist and Surgeon  Anesthesia Plan Comments:        Anesthesia Quick Evaluation

## 2015-08-02 NOTE — Progress Notes (Signed)
CSW received consult for SNF placement, but reviewed H&P and noted that patient plans to return home with home health services. Awaiting PT evaluation.   CSW will sign off - please re-consult if needed.    Raynaldo Opitz, [facility] Clinical Social Worker cell #: [phone]

## 2015-08-02 NOTE — Brief Op Note (Signed)
08/02/2015  1:30 PM  PATIENT:  Wendy Sanchez  65 y.o. female  PRE-OPERATIVE DIAGNOSIS:  severe osteoarthritis left knee  POST-OPERATIVE DIAGNOSIS:  severe osteoarthritis left knee  PROCEDURE:  Procedure(s): LEFT TOTAL KNEE ARTHROPLASTY (Left)  SURGEON:  Surgeon(s) and Role:    * Mcarthur Rossetti, MD - Primary  PHYSICIAN ASSISTANT: Benita Stabile, PA-C   ANESTHESIA:   local and spinal  EBL:  Total I/O In: 2000 [I.V.:2000] Out: 525 [Urine:300; Blood:225]  LOCAL MEDICATIONS USED:  MARCAINE    and OTHER Experil  COUNTS:  YES  TOURNIQUET:   Total Tourniquet Time Documented: Thigh (Left) - 59 minutes Total: Thigh (Left) - 59 minutes   DICTATION: .Other Dictation: Dictation Number [id number]  PLAN OF CARE: Admit to inpatient   PATIENT DISPOSITION:  PACU - hemodynamically stable.   Delay start of Pharmacological VTE agent (>24hrs) due to surgical blood loss or risk of bleeding: no

## 2015-08-02 NOTE — Anesthesia Postprocedure Evaluation (Signed)
Anesthesia Post Note  Patient: Wendy Sanchez  Procedure(s) Performed: Procedure(s) (LRB): LEFT TOTAL KNEE ARTHROPLASTY (Left)  Patient location during evaluation: PACU Anesthesia Type: Spinal and MAC Level of consciousness: awake and alert Pain management: pain level controlled Vital Signs Assessment: post-procedure vital signs reviewed and stable Respiratory status: spontaneous breathing and respiratory function stable Cardiovascular status: blood pressure returned to baseline and stable Postop Assessment: spinal receding Anesthetic complications: no    Last Vitals:  Filed Vitals:   08/02/15 1455 08/02/15 1500  BP:  123/71  Pulse: 82 79  Temp:  36.4 C  Resp: 18 16    Last Pain:  Filed Vitals:   08/02/15 1502  PainSc: 7     LLE Motor Response: Purposeful movement (08/02/15 1500) LLE Sensation: Full sensation (08/02/15 1500) RLE Motor Response: Purposeful movement (08/02/15 1500) RLE Sensation: Full sensation (08/02/15 1500)      [place]

## 2015-08-02 NOTE — Transfer of Care (Signed)
Immediate Anesthesia Transfer of Care Note  Patient: Wendy Sanchez  Procedure(s) Performed: Procedure(s): LEFT TOTAL KNEE ARTHROPLASTY (Left)  Patient Location: PACU  Anesthesia Type:Spinal  Level of Consciousness: awake, alert , oriented and patient cooperative  Airway & Oxygen Therapy: Patient Spontanous Breathing and Patient connected to face mask oxygen  Post-op Assessment: Report given to RN, Post -op Vital signs reviewed and stable and Patient moving all extremities X 4  Post vital signs: stable  Last Vitals:  Filed Vitals:   08/02/15 0949  BP: 125/72  Pulse: 87  Temp: 37.2 C  Resp: 18    Last Pain:  Filed Vitals:   08/02/15 1008  PainSc: 6       Patients Stated Pain Goal: 6 ([id number])  Complications: No apparent anesthesia complications

## 2015-08-03 LAB — BASIC METABOLIC PANEL
Anion gap: 4 — ABNORMAL LOW (ref 5–15)
BUN: 12 mg/dL (ref 6–20)
CHLORIDE: 104 mmol/L (ref 101–111)
CO2: 29 mmol/L (ref 22–32)
CREATININE: 0.74 mg/dL (ref 0.44–1.00)
Calcium: 8.6 mg/dL — ABNORMAL LOW (ref 8.9–10.3)
GFR calc non Af Amer: >60 mL/min (ref >60)
Glucose, Bld: 124 mg/dL — ABNORMAL HIGH (ref 65–99)
POTASSIUM: 4.4 mmol/L (ref 3.5–5.1)
SODIUM: 137 mmol/L (ref 135–145)

## 2015-08-03 LAB — CBC
HEMATOCRIT: 34.3 % — AB (ref 36.0–46.0)
HEMOGLOBIN: 10.9 g/dL — AB (ref 12.0–15.0)
MCH: 27.3 pg (ref 26.0–34.0)
MCHC: 31.8 g/dL (ref 30.0–36.0)
MCV: 86 fL (ref 78.0–100.0)
Platelets: 280 10*3/uL (ref 150–400)
RBC: 3.99 MIL/uL (ref 3.87–5.11)
RDW: 16.7 % — ABNORMAL HIGH (ref 11.5–15.5)
WBC: 8.2 10*3/uL (ref 4.0–10.5)

## 2015-08-03 NOTE — Progress Notes (Signed)
Subjective: 1 Day Post-Op Procedure(s) (LRB): LEFT TOTAL KNEE ARTHROPLASTY (Left) Patient reports pain as moderate.    Objective: Vital signs in last 24 hours: Temp:  [97.6 F (36.4 C)-99.7 F (37.6 C)] 97.9 F (36.6 C) (07/22 1018) Pulse Rate:  [76-102] 102 (07/22 1018) Resp:  [14-25] 20 (07/22 1018) BP: (100-148)/(50-121) 120/54 mmHg (07/22 1018) SpO2:  [94 %-100 %] 94 % (07/22 1018)  Intake/Output from previous day: 07/21 0701 - 07/22 0700 In: 4470 [P.O.:600; I.V.:3770; IV Piggyback:100] Out: 4800 [Urine:4575; Blood:225] Intake/Output this shift: Total I/O In: 389.9 [P.O.:240; I.V.:149.9] Out: 825 [Urine:825]   Recent Labs  08/03/15 0515  HGB 10.9*    Recent Labs  08/03/15 0515  WBC 8.2  RBC 3.99  HCT 34.3*  PLT 280    Recent Labs  08/03/15 0515  NA 137  K 4.4  CL 104  CO2 29  BUN 12  CREATININE 0.74  GLUCOSE 124*  CALCIUM 8.6*   No results for input(s): LABPT, INR in the last 72 hours.  Sensation intact distally Intact pulses distally Dorsiflexion/Plantar flexion intact Incision: no drainage No cellulitis present Compartment soft  Assessment/Plan: 1 Day Post-Op Procedure(s) (LRB): LEFT TOTAL KNEE ARTHROPLASTY (Left) Up with therapy  Discharge Monday to SNF likely due to no home support  Mcarthur Rossetti 08/03/2015, 11:57 AM

## 2015-08-03 NOTE — Op Note (Signed)
NAMEGABRIELLA, MATHIEU NO.:  [id number]  MEDICAL RECORD NO.:  [id number]  LOCATION:  65                         FACILITY:  [facility]  PHYSICIAN:  Lind Guest. Ninfa Linden, M.D.DATE OF BIRTH:  12-26-1950  DATE OF PROCEDURE:  08/02/2015 DATE OF DISCHARGE:                              OPERATIVE REPORT   PREOPERATIVE DIAGNOSIS:  Severe osteoarthritis and degenerative joint disease, left knee.  POSTOPERATIVE DIAGNOSIS:  Severe osteoarthritis and degenerative joint disease, left knee.  PROCEDURE:  Left total knee arthroplasty.  IMPLANTS:  Smith and Nephew LEGION knee system with size 5 Oxinium femur, size 5 tibial tray, size 13 mm polyethylene insert, size 32 patellar button.  SURGEON:  Lind Guest. Ninfa Linden, MD.  ASSISTANT:  Erskine Emery, PA-C.  ANESTHESIA: 1. Spinal. 2. Local with mixture of 20 mL of Exparel, 20 mL of plain Marcaine,     and 20 mL of saline.  TOURNIQUET TIME:  Less than 2 hours.  ESTIMATED BLOOD LOSS:  Between 100-200 mL.  COMPLICATIONS:  None.  INDICATIONS:  Wendy Sanchez is a very pleasant 65 year old, morbidly obese female with debilitating arthritis involving her left knee.  She has tried and failed all forms of conservative treatment including rest, ice, heat, anti-inflammatories, attempts at weight loss, activity modification, anti-inflammatories, steroid injections, supplement injections, an arthroscopic intervention, none of these things have helped.  Her x-ray showed complete loss of the joint space.  Her pain is daily.  It has detrimentally affected her activities of daily living, her quality of life, and mobility.  I was able to assess her clinically and found that her knee is actually quite small from the soft tissue and standpoint although she is a very large individual.  I felt comfortable proceeding with surgery with her understanding that her risk of all different complications are heightened given her morbid obesity  and these include the risk of acute blood loss anemia, nerve and vessel injury, fracture, infection, DVT, hardware failure, and needing revision of implants in the future.  She understands our goals are decreased pain, improved mobility, and overall improved quality of life.  PROCEDURE DESCRIPTION:  After informed consent was obtained, appropriate left knee was marked.  She was brought to the operating room and spinal anesthesia was obtained while she was on her stretcher.  She was then placed supine on the operating table and Foley catheter was placed.  A nonsterile tourniquet was placed around her upper left thigh and her left leg was prepped and draped from the thigh down the toes with DuraPrep and sterile drapes including a sterile stockinette.  Time-out was called, she was identified as correct patient and correct left knee. We then used Esmarch to wrap out the leg and tourniquet was inflated to 300 mm of pressure.  We then made incision over the patella and carried this proximally and distally.  We dissected down the knee joint and carried out a medial parapatellar arthrotomy.  We found a large joint effusion and significant cartilage loss throughout the knee.  We removed peripheral osteophytes from around the knee as well as remnants of ACL, PCL, medial and lateral meniscus.  We then start with the knee in a flexed position.  We put our extramedullary cutting guide for the tibia correcting for neutral slope as well as neutral varus and valgus and taking 9 mm off the high side.  We made our tibial cut without difficulty.  We went to the femur and used an intramedullary guide for the femur through the notch of the femur, setting our distal femoral cut for 5 degrees externally rotated and a 9.5 mm cut was made.  This cut without difficulty and brought the leg back down to full extension and with the 11 mm extension block, I felt that we were good but we still probably need to go with  a 13.  We then went back to the femur and put our femoral sizing guide based off the epicondyle axis and Whitesides line.  We chose a size 5 femur and we put our 4-in-1 cutting block for a size 5 femur, and made our anterior and posterior cuts followed by our chamfer cuts and then we placed a 5 femur trial and made our femoral box cut.  We trialed for a size 5 tibia and we made our keel punch off this. With the trial 5 tibia and a 5 femur in place, we trialed a 13 mm fix bearing polyethylene insert.  We were pleased with stability and range of motion.  We then made our patellar cut and drilled 3 holes for size 32 patellar button.  We then removed all trial components and irrigated the knee with normal saline solution using pulsatile lavage.  We inserted a mixture of Marcaine, Exparel, and saline around the joint capsule.  We then mixed our cement and cemented the real Smith and Nephew tibial tray size 5 followed by the real Smith and Nephew size 5 femur.  We placed the real 13 mm polyethylene insert after cement and debris had been cleaned from the knee and cemented our patellar button. Once the cement had hardened, we removed more cement debris from the knee and then let the tourniquet down.  Hemostasis was obtained with electrocautery.  We irrigated the knee with normal saline solution using pulsatile lavage.  We then closed the joint capsule with interrupted #1 Vicryl suture followed by 0 Vicryl in deep tissue, 2-0 Vicryl in subcutaneous tissue, and interrupted staples on the skin.  Xeroform and well-padded sterile dressing was applied and she was taken to the recovery room in stable condition.  All final counts were correct. There were no complications noted.  Of note, Erskine Emery, PA-C, assisted in the entire case.  His assistance was crucial for facilitating all aspects of this case.     Lind Guest. Ninfa Linden, M.D.     CYB/MEDQ  D:  08/02/2015  T:  08/03/2015  Job:   [id number]

## 2015-08-03 NOTE — Clinical Social Work Placement (Signed)
   CLINICAL SOCIAL WORK PLACEMENT  NOTE  Date:  08/03/2015  Patient Details  Name: Wendy Sanchez MRN: [id number] Date of Birth: 12-01-1950  Clinical Social Work is seeking post-discharge placement for this patient at the [place] level of care (*CSW will initial, date and re-position this form in  chart as items are completed):  Yes   Patient/family provided with [place] Work Department's list of facilities offering this level of care within the geographic area requested by the patient (or if unable, by the patient's family).  Yes   Patient/family informed of their freedom to choose among providers that offer the needed level of care, that participate in Medicare, Medicaid or managed care program needed by the patient, have an available bed and are willing to accept the patient.  Yes   Patient/family informed of [facility]'s ownership interest in [facility] and [facility], as well as of the fact that they are under no obligation to receive care at these facilities.  PASRR submitted to EDS on 08/03/15     PASRR number received on 08/03/15     Existing PASRR number confirmed on       FL2 transmitted to all facilities in geographic area requested by pt/family on 08/03/15     FL2 transmitted to all facilities within larger geographic area on       Patient informed that his/her managed care company has contracts with or will negotiate with certain facilities, including the following:            Patient/family informed of bed offers received.  Patient chooses bed at       Physician recommends and patient chooses bed at      Patient to be transferred to   on  .  Patient to be transferred to facility by       Patient family notified on   of transfer.  Name of family member notified:        PHYSICIAN       Additional Comment:    _______________________________________________ Standley Brooking, LCSW 08/03/2015, 3:53 PM

## 2015-08-03 NOTE — Progress Notes (Signed)
Physical Therapy Treatment Patient Details Name: Wendy Sanchez MRN: [id number] DOB: Mar 28, 1950 Today's Date: 08/03/2015    History of Present Illness Pt is s/p L TKA. Pt with PMH for HTN and arthoscopic sx L knee.     PT Comments    The patient tolerated exercises this visit. Plans For SNF.  Follow Up Recommendations  SNF;Supervision/Assistance - 24 hour     Equipment Recommendations  None recommended by PT    Recommendations for Other Services       Precautions / Restrictions Precautions Precautions: Knee;Fall Required Braces or Orthoses: Knee Immobilizer - Left Knee Immobilizer - Left: Discontinue once straight leg raise with < 10 degree lag Restrictions Weight Bearing Restrictions: No Other Position/Activity Restrictions: WBAT    Mobility  Bed Mobility Overal bed mobility: Needs Assistance Bed Mobility: Supine to Sit     Supine to sit: Min assist;+2 for safety/equipment;HOB elevated     General bed mobility comments: assist for L LE over to EOB.   Transfers Overall transfer level: Needs assistance Equipment used: Rolling walker (2 wheeled) Transfers: Sit to/from Stand Sit to Stand: Mod assist;+2 physical assistance;+2 safety/equipment Stand pivot transfers: Mod assist;+2 physical assistance;+2 safety/equipment       General transfer comment: assist to rise and steady, cues for hand placement and LE management.   Ambulation/Gait                 Stairs            Wheelchair Mobility    Modified Rankin (Stroke Patients Only)       Balance Overall balance assessment: Needs assistance           Standing balance-Leahy Scale: Poor                      Cognition Arousal/Alertness: Awake/alert Behavior During Therapy: WFL for tasks assessed/performed Overall Cognitive Status: Within Functional Limits for tasks assessed                      Exercises Total Joint Exercises Ankle Circles/Pumps: AROM;Both;10  reps Quad Sets: AROM;Both;10 reps Heel Slides: AAROM;Left;10 reps Hip ABduction/ADduction: AAROM;Left;10 reps Straight Leg Raises: AAROM;Left;10 reps    General Comments        Pertinent Vitals/Pain Pain Score: 5  Pain Location: L knee to bedn Pain Descriptors / Indicators: Cramping;Discomfort;Grimacing Pain Intervention(s): Limited activity within patient's tolerance;Monitored during session;Premedicated before session;Repositioned    Home Living Family/patient expects to be discharged to:: Private residence Living Arrangements: Alone Available Help at Discharge: Family;Available PRN/intermittently Type of Home: House Home Access: Ramped entrance   Home Layout: One level Home Equipment: Walker - 2 wheels;Cane - single point;Tub bench;Grab bars - tub/shower Additional Comments: family not availble 24/7    Prior Function Level of Independence: Independent with assistive device(s)          PT Goals (current goals can now be found in the care plan section) Acute Rehab PT Goals Patient Stated Goal: to return to independence PT Goal Formulation: With patient Time For Goal Achievement: 08/07/15 Potential to Achieve Goals: Good Progress towards PT goals: Progressing toward goals    Frequency  7X/week    PT Plan Current plan remains appropriate    Co-evaluation PT/OT/SLP Co-Evaluation/Treatment: Yes Reason for Co-Treatment: For patient/therapist safety PT goals addressed during session: Mobility/safety with mobility OT goals addressed during session: ADL's and self-care     End of Session Equipment Utilized During Treatment: Left knee immobilizer Activity  Tolerance: Patient tolerated treatment well Patient left: in bed;with call bell/phone within reach;with family/visitor present     Time: 1350-1416 PT Time Calculation (min) (ACUTE ONLY): 26 min  Charges:  $Therapeutic Exercise: 23-37 mins                    G Codes:      Claretha Cooper 08/03/2015,  4:47 PM

## 2015-08-03 NOTE — Clinical Social Work Note (Signed)
Clinical Social Work Assessment  Patient Details  Name: Wendy Sanchez MRN: [id number] Date of Birth: [birth year]  Date of referral:  08/03/15               Reason for consult:  Facility Placement                Permission sought to share information with:  Chartered certified accountant granted to share information::  Yes, Verbal Permission Granted  Name::        Agency::     Relationship::     Contact Information:     Housing/Transportation Living arrangements for the past 2 months:  Single Family Home Source of Information:  Patient Patient Interpreter Needed:  None Criminal Activity/Legal Involvement Pertinent to Current Situation/Hospitalization:  No - Comment as needed Significant Relationships:  Adult Children Lives with:  Self Do you feel safe going back to the place where you live?  No Need for family participation in patient care:  No (Coment)  Care giving concerns:  CSW reviewed PT evaluation recommending SNF at discharge.    Social Worker assessment / plan:  CSW met with patient to discuss discharge planning - patient states that she does not feel comfortable returning home at this time & is agreeable with plan for SNF.   Employment status:  Retired Nurse, adult PT Recommendations:  [place] / Referral to community resources:  [place]  Patient/Family's Response to care:  Patient states that she lives in [place] and would prefer a facility close to her - CSW left messages for [facility] - will follow-up Monday with bed offers.   Patient/Family's Understanding of and Emotional Response to Diagnosis, Current Treatment, and Prognosis:  Patient states that she is in a lot of pain and would not be able to manage at home at this time.   Emotional Assessment Appearance:  Appears stated age Attitude/Demeanor/Rapport:    Affect (typically observed):     Orientation:  Oriented to Self, Oriented to Place, Oriented to  Time, Oriented to Situation Alcohol / Substance use:    Psych involvement (Current and /or in the community):     Discharge Needs  Concerns to be addressed:    Readmission within the last 30 days:    Current discharge risk:    Barriers to Discharge:      Standley Brooking, LCSW 08/03/2015, 3:51 PM

## 2015-08-03 NOTE — Evaluation (Signed)
Occupational Therapy Evaluation Patient Details Name: Wendy Sanchez MRN: [id number] DOB: [birth year] Today's Date: 08/03/2015    History of Present Illness Pt is s/p L TKA. Pt with PMH for HTN and arthoscopic sx L knee.    Clinical Impression   Pt up to chair with +2 mod assist to pivot with walker. Pt states her R LE is also weak and she is fearful of it buckling on her. She only has intermittent assist at d/c so feel she will benefit from SNF at d/c . Will follow on acute to progress ADL independence.     Follow Up Recommendations  SNF;Supervision/Assistance - 24 hour    Equipment Recommendations  3 in 1 bedside comode    Recommendations for Other Services       Precautions / Restrictions Precautions Precautions: Knee;Fall Required Braces or Orthoses: Knee Immobilizer - Left Knee Immobilizer - Left: Discontinue once straight leg raise with < 10 degree lag Restrictions Weight Bearing Restrictions: No Other Position/Activity Restrictions: WBAT      Mobility Bed Mobility Overal bed mobility: Needs Assistance Bed Mobility: Supine to Sit     Supine to sit: Min assist;+2 for safety/equipment;HOB elevated     General bed mobility comments: assist for L LE over to EOB.   Transfers Overall transfer level: Needs assistance Equipment used: Rolling walker (2 wheeled) Transfers: Sit to/from [facility] Sit to Stand: Mod assist;+2 physical assistance;+2 safety/equipment Stand pivot transfers: Mod assist;+2 physical assistance;+2 safety/equipment       General transfer comment: assist to rise and steady, cues for hand placement and LE management.     Balance Overall balance assessment: Needs assistance           Standing balance-Leahy Scale: Poor                              ADL Overall ADL's : Needs assistance/impaired Eating/Feeding: Independent;Sitting   Grooming: Wash/dry hands;Set up;Sitting   Upper Body Bathing: Min  guard;Sitting   Lower Body Bathing: +2 for physical assistance;+2 for safety/equipment;Maximal assistance;Sit to/from stand   Upper Body Dressing : Set up;Sitting   Lower Body Dressing: +2 for physical assistance;+2 for safety/equipment;Total assistance;Sit to/from stand   Toilet Transfer: +2 for physical assistance;+2 for safety/equipment;Moderate assistance;Stand-pivot;RW   Toileting- Clothing Manipulation and Hygiene: +2 for physical assistance;Total assistance;+2 for safety/equipment;Sit to/from stand Toileting - Clothing Manipulation Details (indicate cue type and reason): pt unable to let go of walker to manage clothing/hygiene safely at this time.       General ADL Comments: Pt just returning to bed with nursing when therapy arrived but was agreeable to rest for a few minutes and then transfer into recliner. Pt states she only has intermittent assist at home and discussed option of SNF for rehab. Began discussion of AE (ie reacher) but would benefit from additional AE training and practice.      Vision     Perception     Praxis      Pertinent Vitals/Pain Pain Assessment: 0-10 Pain Score: 5  Pain Location: L knee after activity and several minutes of rest. 10/10 with movement. Pain Descriptors / Indicators: Aching;Grimacing Pain Intervention(s): Monitored during session;Repositioned;Ice applied     Hand Dominance Right   Extremity/Trunk Assessment Upper Extremity Assessment Upper Extremity Assessment: LUE deficits/detail LUE Deficits / Details: decreased grip strength           Communication Communication Communication: No difficulties   Cognition Arousal/Alertness:  Awake/alert Behavior During Therapy: WFL for tasks assessed/performed Overall Cognitive Status: Within Functional Limits for tasks assessed                     General Comments       Exercises       Shoulder Instructions      Home Living Family/patient expects to be discharged  to:: Private residence   Available Help at Discharge: Family Type of Home: House Home Access: Ramped entrance     [place]: One level     Bathroom Shower/Tub: Teacher, early years/pre: Handicapped height     Home Equipment: Environmental consultant - 2 wheels;Cane - single point;Tub bench;Grab bars - tub/shower          Prior Functioning/Environment Level of Independence: Independent with assistive device(s)             OT Diagnosis: Generalized weakness   OT Problem List: Decreased strength;Decreased knowledge of use of DME or AE   OT Treatment/Interventions: Self-care/ADL training;DME and/or AE instruction;Patient/family education;Therapeutic activities    OT Goals(Current goals can be found in the care plan section) Acute Rehab OT Goals Patient Stated Goal: to return to independence OT Goal Formulation: With patient Time For Goal Achievement: 08/10/15 Potential to Achieve Goals: Good  OT Frequency: Min 2X/week   Barriers to D/C:            Co-evaluation PT/OT/SLP Co-Evaluation/Treatment: Yes Reason for Co-Treatment: For patient/therapist safety   OT goals addressed during session: ADL's and self-care;Proper use of Adaptive equipment and DME      End of Session Equipment Utilized During Treatment: Rolling walker;Left knee immobilizer  Activity Tolerance: Patient limited by pain Patient left: in chair;with call bell/phone within reach   Time: 1000-1025 OT Time Calculation (min): 25 min Charges:  OT General Charges $OT Visit: 1 Procedure OT Evaluation $OT Eval Low Complexity: 1 Procedure G-Codes:    Jules Schick 08/03/2015, 1:16 PM

## 2015-08-03 NOTE — NC FL2 (Signed)
[place] LEVEL OF CARE SCREENING TOOL     IDENTIFICATION  Patient Name: Wendy Sanchez Birthdate: 09/05/1950 Sex: female Admission Date (Current Location): 08/02/2015  [facility] and [place] Number:  Engineering geologist and Address:  [facility],  [place], [place]      Provider Number: [id number]  Attending Physician Name and Address:  Mcarthur Rossetti, *  Relative Name and Phone Number:       Current Level of Care: Hospital Recommended Level of Care: [place] Prior Approval Number:    Date Approved/Denied:   PASRR Number: [id number]  Discharge Plan: Home    Current Diagnoses: Patient Active Problem List   Diagnosis Date Noted  . Osteoarthritis of left knee 08/02/2015  . 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 Spinal stenosis of lumbar region 06/19/2015  . Chronic pain syndrome 10/30/2014    Orientation RESPIRATION BLADDER Height & Weight     Self, Time, Situation, Place  Normal Continent Weight: (!) 315 lb 8 oz (143.11 kg) Height:  5\' 8"  (172.7 cm)  BEHAVIORAL SYMPTOMS/MOOD NEUROLOGICAL BOWEL NUTRITION STATUS      Continent Diet (Regular)  AMBULATORY STATUS COMMUNICATION OF NEEDS Skin   Extensive Assist Verbally Normal                       Personal Care Assistance Level of Assistance  Bathing, Dressing Bathing Assistance: Limited assistance   Dressing Assistance: Limited assistance     Functional Limitations Info             SPECIAL CARE FACTORS FREQUENCY  PT (By licensed PT), OT (By licensed OT)     PT Frequency: 5 OT Frequency: 5            Contractures      Additional Factors Info  Code Status, Allergies Code Status Info: Fullcode Allergies Info: Allergies:  Other, Tape, Iodine           Current Medications (08/03/2015):  This is the current hospital active medication list Current Facility-Administered Medications  Medication Dose Route  Frequency Provider Last Rate Last Dose  . 0.9 %  sodium chloride infusion   Intravenous Continuous Mcarthur Rossetti, MD 10 mL/hr at 08/03/15 [phone]    . acetaminophen (TYLENOL) tablet 650 mg  650 mg Oral Q6H PRN Mcarthur Rossetti, MD       Or  . acetaminophen (TYLENOL) suppository 650 mg  650 mg Rectal Q6H PRN Mcarthur Rossetti, MD      . albuterol (PROVENTIL) (2.5 MG/3ML) 0.083% nebulizer solution 3 mL  3 mL Inhalation Q6H PRN Mcarthur Rossetti, MD      . alum & mag hydroxide-simeth (MAALOX/MYLANTA) 200-200-20 MG/5ML suspension 30 mL  30 mL Oral Q4H PRN Mcarthur Rossetti, MD      . aspirin EC tablet 325 mg  325 mg Oral BID PC Mcarthur Rossetti, MD   325 mg at 08/03/15 [id number]  . cyclobenzaprine (FLEXERIL) tablet 10 mg  10 mg Oral TID PRN Mcarthur Rossetti, MD   10 mg at 08/03/15 1105  . diphenhydrAMINE (BENADRYL) 12.5 MG/5ML elixir 12.5-25 mg  12.5-25 mg Oral Q4H PRN Mcarthur Rossetti, MD      . docusate sodium (COLACE) capsule 100 mg  100 mg Oral BID Mcarthur Rossetti, MD   100 mg at 08/03/15 0913  . folic acid (FOLVITE) tablet 1 mg  1 mg Oral Daily Harrell Gave  Kerry Fort, MD   1 mg at 08/03/15 0912  . furosemide (LASIX) tablet 40 mg  40 mg Oral Daily Mcarthur Rossetti, MD   40 mg at 08/03/15 0942  . gabapentin (NEURONTIN) capsule 600 mg  600 mg Oral TID Mcarthur Rossetti, MD   600 mg at 08/03/15 1507  . HYDROmorphone (DILAUDID) injection 1 mg  1 mg Intravenous Q2H PRN Mcarthur Rossetti, MD   1 mg at 08/03/15 [phone]  . menthol-cetylpyridinium (CEPACOL) lozenge 3 mg  1 lozenge Oral PRN Mcarthur Rossetti, MD       Or  . phenol (CHLORASEPTIC) mouth spray 1 spray  1 spray Mouth/Throat PRN Mcarthur Rossetti, MD      . metoCLOPramide (REGLAN) tablet 5-10 mg  5-10 mg Oral Q8H PRN Mcarthur Rossetti, MD       Or  . metoCLOPramide (REGLAN) injection 5-10 mg  5-10 mg Intravenous Q8H PRN Mcarthur Rossetti, MD      . mirabegron ER  [facility]) tablet 50 mg  50 mg Oral Daily Mcarthur Rossetti, MD   50 mg at 08/03/15 0914  . mometasone-formoterol (DULERA) 200-5 MCG/ACT inhaler 2 puff  2 puff Inhalation BID Mcarthur Rossetti, MD   2 puff at 08/03/15 [phone]  . montelukast (SINGULAIR) tablet 10 mg  10 mg Oral QHS Mcarthur Rossetti, MD   10 mg at 08/02/15 2204  . nortriptyline (PAMELOR) capsule 50 mg  50 mg Oral QHS Mcarthur Rossetti, MD   50 mg at 08/02/15 2204  . ondansetron (ZOFRAN) tablet 4 mg  4 mg Oral Q6H PRN Mcarthur Rossetti, MD       Or  . ondansetron [facility]) injection 4 mg  4 mg Intravenous Q6H PRN Mcarthur Rossetti, MD      . oxyCODONE (Oxy IR/ROXICODONE) immediate release tablet 5-15 mg  5-15 mg Oral Q3H PRN Mcarthur Rossetti, MD   15 mg at 08/03/15 1509  . pantoprazole (PROTONIX) EC tablet 40 mg  40 mg Oral Daily Mcarthur Rossetti, MD   40 mg at 08/03/15 0912  . polyethylene glycol (MIRALAX / GLYCOLAX) packet 17 g  17 g Oral Daily PRN Mcarthur Rossetti, MD      . potassium chloride SA (K-DUR,KLOR-CON) CR tablet 20 mEq  20 mEq Oral Once per day on Mon Wed Fri Mcarthur Rossetti, MD   20 mEq at 08/03/15 [phone]  . ramipril (ALTACE) capsule 10 mg  10 mg Oral BID Mcarthur Rossetti, MD   10 mg at 08/02/15 2205  . saccharomyces boulardii (FLORASTOR) capsule 250 mg  250 mg Oral QHS Thomes Lolling, RPH   250 mg at 08/02/15 2205  . sucralfate (CARAFATE) tablet 1 g  1 g Oral BID Mcarthur Rossetti, MD   1 g at 08/03/15 0913  . triamterene-hydrochlorothiazide (MAXZIDE) 75-50 MG per tablet 1 tablet  1 tablet Oral Daily PRN Mcarthur Rossetti, MD         Discharge Medications: Please see discharge summary for a list of discharge medications.  Relevant Imaging Results:  Relevant Lab Results:   Additional Information SSN: [id number]  Standley Brooking, LCSW

## 2015-08-03 NOTE — Evaluation (Signed)
Physical Therapy Evaluation Patient Details Name: Wendy Sanchez MRN: [id number] DOB: November 05, 1950 Today's Date: 08/03/2015   History of Present Illness  Pt is s/p L TKA. Pt with PMH for HTN and arthoscopic sx L knee.   Clinical Impression  The patient is mobilizing from bed to recliner. Increased pain but tolerated the activity. THE PATIENT DOES NOT HAVE 24 HOUR CAREGIVERS. RECOMMEND SNF. Pt admitted with above diagnosis. Pt currently with functional limitations due to the deficits listed below (see PT Problem List).  Pt will benefit from skilled PT to increase their independence and safety with mobility to allow discharge to the venue listed below.       Follow Up Recommendations SNF;Supervision/Assistance - 24 hour    Equipment Recommendations  None recommended by PT    Recommendations for Other Services       Precautions / Restrictions Precautions Precautions: Knee;Fall Required Braces or Orthoses: Knee Immobilizer - Left Knee Immobilizer - Left: Discontinue once straight leg raise with < 10 degree lag Restrictions Weight Bearing Restrictions: No Other Position/Activity Restrictions: WBAT      Mobility  Bed Mobility Overal bed mobility: Needs Assistance Bed Mobility: Supine to Sit     Supine to sit: Min assist;+2 for safety/equipment;HOB elevated     General bed mobility comments: assist for L LE over to EOB.   Transfers Overall transfer level: Needs assistance Equipment used: Rolling walker (2 wheeled) Transfers: Sit to/from Stand Sit to Stand: Mod assist;+2 physical assistance;+2 safety/equipment Stand pivot transfers: Mod assist;+2 physical assistance;+2 safety/equipment       General transfer comment: assist to rise and steady, cues for hand placement and LE management.   Ambulation/Gait                Stairs            Wheelchair Mobility    Modified Rankin (Stroke Patients Only)       Balance Overall balance assessment: Needs  assistance           Standing balance-Leahy Scale: Poor                               Pertinent Vitals/Pain Pain Assessment: 0-10 Pain Score: 5  Pain Location: L knee Pain Descriptors / Indicators: Aching;Tender Pain Intervention(s): Monitored during session;Premedicated before session    Home Living Family/patient expects to be discharged to:: Private residence Living Arrangements: Alone Available Help at Discharge: Family;Available PRN/intermittently Type of Home: House Home Access: Ramped entrance     Home Layout: One level Home Equipment: Walker - 2 wheels;Cane - single point;Tub bench;Grab bars - tub/shower Additional Comments: family not availble 24/7    Prior Function Level of Independence: Independent with assistive device(s)               Hand Dominance   Dominant Hand: Right    Extremity/Trunk Assessment   Upper Extremity Assessment: Defer to OT evaluation       LUE Deficits / Details: decreased grip strength   Lower Extremity Assessment: LLE deficits/detail   LLE Deficits / Details: needs assist to raise the leg     Communication   Communication: No difficulties  Cognition Arousal/Alertness: Awake/alert Behavior During Therapy: WFL for tasks assessed/performed Overall Cognitive Status: Within Functional Limits for tasks assessed                      General Comments  Exercises        Assessment/Plan    PT Assessment Patient needs continued PT services  PT Diagnosis Difficulty walking;Acute pain   PT Problem List Decreased range of motion;Decreased activity tolerance;Decreased balance;Decreased mobility;Decreased safety awareness;Decreased knowledge of use of DME;Decreased strength;Decreased knowledge of precautions  PT Treatment Interventions DME instruction;Gait training;Functional mobility training;Therapeutic activities;Therapeutic exercise;Patient/family education   PT Goals (Current goals can be  found in the Care Plan section) Acute Rehab PT Goals Patient Stated Goal: to return to independence PT Goal Formulation: With patient Time For Goal Achievement: 08/07/15 Potential to Achieve Goals: Good    Frequency 7X/week   Barriers to discharge Decreased caregiver support      Co-evaluation PT/OT/SLP Co-Evaluation/Treatment: Yes Reason for Co-Treatment: For patient/therapist safety PT goals addressed during session: Mobility/safety with mobility OT goals addressed during session: ADL's and self-care       End of Session Equipment Utilized During Treatment: Left knee immobilizer Activity Tolerance: Patient tolerated treatment well Patient left: in chair;with family/visitor present Nurse Communication: Mobility status         Time: [id number] PT Time Calculation (min) (ACUTE ONLY): 28 min   Charges:   PT Evaluation $PT Eval Low Complexity: 1 Procedure     PT G CodesTresa Endo PT [id number]  Claretha Cooper 08/03/2015, 1:50 PM

## 2015-08-04 MED ORDER — FLUCONAZOLE 150 MG PO TABS
150.0000 mg | ORAL_TABLET | Freq: Once | ORAL | Status: AC
  Administered 2015-08-04: 150 mg via ORAL
  Filled 2015-08-04: qty 1

## 2015-08-04 NOTE — Progress Notes (Signed)
Subjective: Patient stable has been up with help to the bathroom she is tolerating CPM   Objective: Vital signs in last 24 hours: Temp:  [97.9 F (36.6 C)-100.4 F (38 C)] 98.2 F (36.8 C) (07/22 1714) Pulse Rate:  [102-109] 109 (07/22 2129) Resp:  [18-20] 18 (07/22 2129) BP: (90-120)/(50-55) 97/54 (07/22 2129) SpO2:  [94 %-97 %] 97 % (07/22 2129)  Intake/Output from previous day: 07/22 0701 - 07/23 0700 In: 1209.9 [P.O.:960; I.V.:249.9] Out: 1975 [Urine:1975] Intake/Output this shift: No intake/output data recorded.  Exam:  Dorsiflexion/Plantar flexion intact  Labs:  Recent Labs  08/03/15 0515  HGB 10.9*    Recent Labs  08/03/15 0515  WBC 8.2  RBC 3.99  HCT 34.3*  PLT 280    Recent Labs  08/03/15 0515  NA 137  K 4.4  CL 104  CO2 29  BUN 12  CREATININE 0.74  GLUCOSE 124*  CALCIUM 8.6*   No results for input(s): LABPT, INR in the last 72 hours.  Assessment/Plan: Patient doing well continue with mobilization and CPM possible discharge Monday or Tuesday   Braxon Suder SCOTT 08/04/2015, 8:44 AM

## 2015-08-04 NOTE — Care Management Note (Signed)
Case Management Note  Patient Details  Name: Wendy Sanchez MRN: [id number] Date of Birth: 02-Apr-1950  Subjective/Objective:                   L TKA  Action/Plan: CM spoke with patient at the bedside. The plan is for her to be discharged to a SNF. CSW is following.  Expected Discharge Date:  08/05/15               Expected Discharge Plan:  Skilled Nursing Facility  In-House Referral:  Clinical Social Work  Discharge planning Services  CM Consult  Post Acute Care Choice:    Choice offered to:  NA  DME Arranged:    DME Agency:     HH Arranged:    [place] Agency:     Status of Service:  Completed, signed off  If discussed at [employer], dates discussed:    Additional Comments:  Apolonio Schneiders, RN 08/04/2015, 11:32 AM

## 2015-08-04 NOTE — Progress Notes (Signed)
Physical Therapy Treatment Patient Details Name: Wendy Sanchez MRN: [id number] DOB: [birth year] Today's Date: 08/04/2015    History of Present Illness Pt is s/p L TKA. Pt with PMH for HTN and arthoscopic sx L knee.     PT Comments    The patient is putting forth efforts to progress ambulation. The patient is very deconditioned. Recommend SNF.  Follow Up Recommendations  SNF;Supervision/Assistance - 24 hour     Equipment Recommendations  None recommended by PT    Recommendations for Other Services       Precautions / Restrictions Precautions Precautions: Knee Required Braces or Orthoses: Knee Immobilizer - Left Knee Immobilizer - Left: Discontinue once straight leg raise with < 10 degree lag    Mobility  Bed Mobility   Bed Mobility: Supine to Sit;Sit to Supine     Supine to sit: Min assist;+2 for safety/equipment;HOB elevated     General bed mobility comments: assist for L LE over to EOB.  and back onto bed.  Transfers Overall transfer level: Needs assistance Equipment used: Rolling walker (2 wheeled) Transfers: Sit to/from Stand Sit to Stand: Mod assist;+2 safety/equipment Stand pivot transfers: Mod assist;+2 safety/equipment       General transfer comment: assist to rise and steady, cues for hand placement and LE management.   Ambulation/Gait Ambulation/Gait assistance: Mod assist;+2 safety/equipment Ambulation Distance (Feet): 50 Feet (then 10 x 2 to BR) Assistive device: Rolling walker (2 wheeled) Gait Pattern/deviations: Step-to pattern;Antalgic;Decreased step length - left;Decreased stance time - left     General Gait Details: patient reports that the right knee can lock up at times. BP 127/66, HR 116   Stairs            Wheelchair Mobility    Modified Rankin (Stroke Patients Only)       Balance                                    Cognition Arousal/Alertness: Awake/alert                          Exercises  Total Joint Exercises Ankle Circles/Pumps: AROM;Both;10 reps Quad Sets: AROM;Both;10 reps Heel Slides: AAROM;Left;10 reps Hip ABduction/ADduction: AAROM;Left;10 reps Straight Leg Raises: AAROM;Left;10 reps Goniometric ROM: 10-45 L knee flexion    General Comments        Pertinent Vitals/Pain Pain Score: 6  Pain Location: L knee Pain Descriptors / Indicators: Aching;Tightness Pain Intervention(s): Monitored during session;Premedicated before session;Ice applied    Home Living                      Prior Function            PT Goals (current goals can now be found in the care plan section) Progress towards PT goals: Progressing toward goals    Frequency  7X/week    PT Plan Current plan remains appropriate    Co-evaluation             End of Session Equipment Utilized During Treatment: Left knee immobilizer Activity Tolerance: Patient limited by pain;Patient limited by fatigue Patient left: in bed;with call bell/phone within reach;with bed alarm set     Time: 0922-1000 PT Time Calculation (min) (ACUTE ONLY): 38 min  Charges:  $Gait Training: 8-22 mins $Therapeutic Exercise: 8-22 mins $Self Care/Home Management: 8-22  G Codes:      Claretha Cooper 08/04/2015, 4:08 PM Tresa Endo PT [phone]

## 2015-08-05 ENCOUNTER — Encounter (HOSPITAL_COMMUNITY): Payer: Self-pay | Admitting: *Deleted

## 2015-08-05 NOTE — Progress Notes (Signed)
OT Cancellation Note  Patient Details Name: Wendy Sanchez MRN: [id number] DOB: November 12, 1950   Cancelled Treatment:    Reason Eval/Treat Not Completed: Fatigue/lethargy limiting ability to participate Pt getting back to bed with PT. Will check on pt later in day or next day.   Betsy Pries 08/05/2015, 1:53 PM

## 2015-08-05 NOTE — Progress Notes (Signed)
Physical Therapy Treatment Patient Details Name: RAEANNE MARSICANO MRN: [id number] DOB: 1950-02-27 Today's Date: 08/05/2015    History of Present Illness Pt is s/p L TKA. Pt with PMH for HTN and arthoscopic sx L knee.     PT Comments    POD # 3 pm session.  Pt only tolerated a few steps from recliner back to bed stating she had just come back from the bathroom with nursing.  Assisted back to bed to perform some TKR TE's following HEP handout.    Follow Up Recommendations  SNF per PT eval Per chart review, pt plans to D/C to home     Equipment Recommendations  None recommended by PT (RW delivered to room during session)    Recommendations for Other Services       Precautions / Restrictions Precautions Precautions: Knee;Fall Required Braces or Orthoses: Knee Immobilizer - Left Knee Immobilizer - Left: Discontinue once straight leg raise with < 10 degree lag Restrictions Weight Bearing Restrictions: No Other Position/Activity Restrictions: WBAT    Mobility  Bed Mobility Overal bed mobility: Needs Assistance Bed Mobility: Sit to Supine     Supine to sit: Supervision;HOB elevated Sit to supine: Min guard   General bed mobility comments: increased time assisted back to bed  Transfers Overall transfer level: Needs assistance Equipment used: Rolling walker (2 wheeled) Transfers: Sit to/from Stand Sit to Stand: Min assist;From elevated surface         General transfer comment: assist to rise and steady, verbal cues for UE and LE positioning   increased time  Ambulation/Gait Ambulation/Gait assistance: Min guard Ambulation Distance (Feet): 4 Feet Assistive device: Rolling walker (2 wheeled) Gait Pattern/deviations: Step-to pattern Gait velocity: decreased   General Gait Details: only tolerated a few steps back to bed as pt was c/o fatigue.  Had just gone to the bathroom with nursing.   Stairs            Wheelchair Mobility    Modified Rankin (Stroke  Patients Only)       Balance                                    Cognition Arousal/Alertness: Awake/alert Behavior During Therapy: WFL for tasks assessed/performed Overall Cognitive Status: Within Functional Limits for tasks assessed                      Exercises Total Joint Exercises Ankle Circles/Pumps: AROM;Both;10 reps Quad Sets: AROM;Both;10 reps     General Comments        Pertinent Vitals/Pain Pain Assessment: 0-10 Pain Score: 4  Pain Location: L knee Pain Descriptors / Indicators: Aching;Sore Pain Intervention(s): Monitored during session;Premedicated before session;Repositioned;Ice applied    Home Living                      Prior Function            PT Goals (current goals can now be found in the care plan section) Progress towards PT goals: Progressing toward goals    Frequency  7X/week    PT Plan Current plan remains appropriate    Co-evaluation             End of Session Equipment Utilized During Treatment: Left knee immobilizer;Gait belt Activity Tolerance: Patient limited by fatigue Patient left: in bed;with call bell/phone within reach;with bed alarm set  Time: [id number] PT Time Calculation (min) (ACUTE ONLY): 12 min  Charges:  $Therapeutic Activity: 8-22 mins                    G Codes:      Rica Koyanagi  PTA [facility]  Acute  Rehab Pager      [phone]

## 2015-08-05 NOTE — Progress Notes (Signed)
Subjective: 3 Days Post-Op Procedure(s) (LRB): LEFT TOTAL KNEE ARTHROPLASTY (Left) Patient reports pain as moderate.    Objective: Vital signs in last 24 hours: Temp:  [98.2 F (36.8 C)-99.7 F (37.6 C)] 98.6 F (37 C) (07/24 0456) Pulse Rate:  [96-107] 96 (07/24 0456) Resp:  [18-185] 18 (07/24 0456) BP: (80-109)/(54-77) 95/65 (07/24 0456) SpO2:  [95 %-99 %] 95 % (07/24 0456)  Intake/Output from previous day: 07/23 0701 - 07/24 0700 In: 1320 [P.O.:1320] Out: 1875 [Urine:1875] Intake/Output this shift: No intake/output data recorded.   Recent Labs  08/03/15 0515  HGB 10.9*    Recent Labs  08/03/15 0515  WBC 8.2  RBC 3.99  HCT 34.3*  PLT 280    Recent Labs  08/03/15 0515  NA 137  K 4.4  CL 104  CO2 29  BUN 12  CREATININE 0.74  GLUCOSE 124*  CALCIUM 8.6*   No results for input(s): LABPT, INR in the last 72 hours.  Sensation intact distally Intact pulses distally Dorsiflexion/Plantar flexion intact Incision: dressing C/D/I No cellulitis present Compartment soft  Assessment/Plan: 3 Days Post-Op Procedure(s) (LRB): LEFT TOTAL KNEE ARTHROPLASTY (Left) Up with therapy Plan for discharge tomorrow Discharge home with home health  Wendy Sanchez 08/05/2015, 7:52 AM

## 2015-08-05 NOTE — Progress Notes (Deleted)
Patient ID: Wendy Sanchez, female   DOB: 04-11-1950, 65 y.o.   MRN: [id number] Looks good.  Can discharge to home today.

## 2015-08-05 NOTE — Progress Notes (Signed)
Spoke with patient at bedside. Plan now is for d/c home, patient states she has lots of family to assist at home. Patient has used [facility] in the past and would like to use them again, contacted [facility] for referral. Also requesting RW and 3n1, requested [facility] to deliver to room.

## 2015-08-05 NOTE — Progress Notes (Signed)
[facility]  Patient Status: New  [facility] is providing the following services: PT  If patient discharges after hours, please call [phone].   Wendy Sanchez 08/05/2015, 12:38 PM

## 2015-08-05 NOTE — Progress Notes (Signed)
Physical Therapy Treatment Patient Details Name: Wendy Sanchez MRN: [id number] DOB: May 04, 1950 Today's Date: 08/05/2015    History of Present Illness Pt is s/p L TKA. Pt with PMH for HTN and arthoscopic sx L knee.     PT Comments    Pt reports d/c plan is for home tomorrow now.  Pt educated on donning KI however requires assist.  Pt also still requiring assist for bed mobility and transfers.  Pt encouraged to have her family assist 24/7 for at least a couple days upon d/c.   Follow Up Recommendations  SNF;Supervision/Assistance - 24 hour (pt reports plan is for home however)     Equipment Recommendations  None recommended by PT (RW delivered to room during session)    Recommendations for Other Services       Precautions / Restrictions Precautions Precautions: Knee;Fall Required Braces or Orthoses: Knee Immobilizer - Left Knee Immobilizer - Left: Discontinue once straight leg raise with < 10 degree lag Restrictions Weight Bearing Restrictions: No Other Position/Activity Restrictions: WBAT    Mobility  Bed Mobility Overal bed mobility: Needs Assistance Bed Mobility: Supine to Sit     Supine to sit: Supervision;HOB elevated     General bed mobility comments: pt used overhead trapeze, verbal cues for self assist  Transfers Overall transfer level: Needs assistance Equipment used: Rolling walker (2 wheeled) Transfers: Sit to/from Stand Sit to Stand: Min assist;From elevated surface         General transfer comment: assist to rise and steady, verbal cues for UE and LE positioning  Ambulation/Gait Ambulation/Gait assistance: Min guard Ambulation Distance (Feet): 50 Feet Assistive device: Rolling walker (2 wheeled) Gait Pattern/deviations: Step-to pattern;Antalgic;Decreased stance time - left     General Gait Details: slow but steady gait with RW, one standing rest break required due to UE fatigue   Stairs            Wheelchair Mobility    Modified  Rankin (Stroke Patients Only)       Balance                                    Cognition Arousal/Alertness: Awake/alert Behavior During Therapy: WFL for tasks assessed/performed Overall Cognitive Status: Within Functional Limits for tasks assessed                      Exercises Total Joint Exercises Ankle Circles/Pumps: AROM;Both;10 reps Quad Sets: AROM;Both;10 reps Short Arc Quad: AROM;10 reps;Left Heel Slides: AAROM;Left;10 reps Hip ABduction/ADduction: AAROM;Left;10 reps Straight Leg Raises: AAROM;Left;10 reps    General Comments        Pertinent Vitals/Pain Pain Assessment: 0-10 Pain Score: 4  Pain Location: L knee Pain Descriptors / Indicators: Aching;Sore Pain Intervention(s): Limited activity within patient's tolerance;Monitored during session;Premedicated before session;Repositioned    Home Living                      Prior Function            PT Goals (current goals can now be found in the care plan section) Progress towards PT goals: Progressing toward goals    Frequency  7X/week    PT Plan Current plan remains appropriate    Co-evaluation             End of Session Equipment Utilized During Treatment: Left knee immobilizer;Gait belt Activity Tolerance: Patient limited by fatigue Patient  left: in chair;with call bell/phone within reach     Time: 1026-1054 PT Time Calculation (min) (ACUTE ONLY): 28 min  Charges:  $Gait Training: 8-22 mins $Therapeutic Exercise: 8-22 mins                    G Codes:      Wendy Sanchez,Wendy Sanchez 08-15-2015, 12:47 PM Wendy Sanchez, PT, DPT 2015-08-15 Pager: [phone]

## 2015-08-05 NOTE — Discharge Summary (Signed)
Patient ID: Wendy Sanchez MRN: [id number] DOB/AGE: Nov 21, 1950 65 y.o.  Admit date: 08/02/2015 Discharge date: 08/05/2015  Admission Diagnoses:  Principal Problem:   Osteoarthritis of left knee Active Problems:   Status post total left knee replacement   Discharge Diagnoses:  Same  Past Medical History:  Diagnosis Date  . Allergic rhinitis   . Arthritis   . Complication of anesthesia    awakens during surgery; has occurred with last 3-4 surgeries   . Environmental allergies    fumes   . Fibromyalgia   . GERD (gastroesophageal reflux disease)   . History of bronchitis   . Hypertension   . Numbness    hands bilat when driving; improves when not preforming task   . Status post gastric banding   . Tinnitus    comes and goes   . Vertigo    6 months ago approx     Surgeries: Procedure(s): LEFT TOTAL KNEE ARTHROPLASTY on 08/02/2015   Consultants:   Discharged Condition: Improved  Hospital Course: Wendy Sanchez is an 65 y.o. female who was admitted 08/02/2015 for operative treatment ofOsteoarthritis of left knee. Patient has severe unremitting pain that affects sleep, daily activities, and work/hobbies. After pre-op clearance the patient was taken to the operating room on 08/02/2015 and underwent  Procedure(s): LEFT TOTAL KNEE ARTHROPLASTY.    Patient was given perioperative antibiotics: Anti-infectives    Start     Dose/Rate Route Frequency Ordered Stop   08/04/15 1800  fluconazole (DIFLUCAN) tablet 150 mg     150 mg Oral  Once 08/04/15 1539 08/04/15 1711   08/02/15 1700  ceFAZolin (ANCEF) IVPB 2g/100 mL premix     2 g 200 mL/hr over 30 Minutes Intravenous Every 6 hours 08/02/15 1532 08/02/15 2303   08/02/15 0600  ceFAZolin (ANCEF) 3 g in dextrose 5 % 50 mL IVPB     3 g 130 mL/hr over 30 Minutes Intravenous On call to O.R. 08/01/15 1503 08/02/15 1129       Patient was given sequential compression devices, early ambulation, and chemoprophylaxis to prevent  DVT.  Patient benefited maximally from hospital stay and there were no complications.    Recent vital signs: Patient Vitals for the past 24 hrs:  BP Temp Temp src Pulse Resp SpO2  08/05/15 0456 95/65 98.6 F (37 C) Oral 96 18 95 %  08/05/15 0229 109/60 98.2 F (36.8 C) Oral (!) 103 - 97 %  08/05/15 0200 (!) 80/54 - - - - -  08/04/15 2216 109/77 99.7 F (37.6 C) Oral (!) 107 19 99 %  08/04/15 2002 - - - - - 96 %  08/04/15 1338 104/64 99.5 F (37.5 C) Oral (!) 107 (!) 185 99 %  08/04/15 0848 - - - - - 97 %     Recent laboratory studies:  Recent Labs  08/03/15 0515  WBC 8.2  HGB 10.9*  HCT 34.3*  PLT 280  NA 137  K 4.4  CL 104  CO2 29  BUN 12  CREATININE 0.74  GLUCOSE 124*  CALCIUM 8.6*     Discharge Medications:     Medication List    TAKE these medications   albuterol 108 (90 Base) MCG/ACT inhaler Commonly known as:  PROVENTIL HFA;VENTOLIN HFA Inhale 2 puffs into the lungs every 6 (six) hours as needed for wheezing or shortness of breath.   azelastine 0.1 % nasal spray Commonly known as:  ASTELIN Place 1 spray into both nostrils 2 (two) times daily  as needed for allergies. Use in each nostril as directed   clotrimazole 1 % cream Commonly known as:  LOTRIMIN Apply 1 application topically 2 (two) times daily as needed. prn   cyclobenzaprine 10 MG tablet Commonly known as:  FLEXERIL Take 10 mg by mouth 3 (three) times daily as needed for muscle spasms.   dexlansoprazole 60 MG capsule Commonly known as:  DEXILANT Take 60 mg by mouth daily.   diclofenac sodium 1 % Gel Commonly known as:  VOLTAREN Apply 2 g topically 4 (four) times daily.   fexofenadine 180 MG tablet Commonly known as:  ALLEGRA Take 180 mg by mouth daily.   FIRST-DUKES MOUTHWASH Susp Use as directed 5 mLs in the mouth or throat 3 (three) times daily as needed (mouth sores. Dukes magic mouthwash.).   fluconazole 150 MG tablet Commonly known as:  DIFLUCAN Take 150 mg by mouth once.  Pt has currently not taken but does have prescription if needed (prescription for 2 tabs) Instructions 1 tab, then repeat 1 wk - pt states if begins to itch can begin medication   Fluticasone-Salmeterol 250-50 MCG/DOSE Aepb Commonly known as:  ADVAIR Inhale 1 puff into the lungs 2 (two) times daily.   folic acid 1 MG tablet Commonly known as:  FOLVITE Take 1 mg by mouth daily.   furosemide 40 MG tablet Commonly known as:  LASIX Take 40 mg by mouth daily.   gabapentin 300 MG capsule Commonly known as:  NEURONTIN Take 2 capsules (600 mg total) by mouth 3 (three) times daily. Takes two capsules twice a day What changed:  additional instructions   ibuprofen 800 MG tablet Commonly known as:  ADVIL,MOTRIN Take 1 tablet (800 mg total) by mouth every 8 (eight) hours as needed. What changed:  reasons to take this   MAXZIDE 75-50 MG tablet Generic drug:  triamterene-hydrochlorothiazide Take 1 tablet by mouth daily as needed (edema).   metroNIDAZOLE 500 MG tablet Commonly known as:  FLAGYL Take 1,000 mg by mouth daily. Pt states has completed prescription 07/24/2015   mirabegron ER 50 MG Tb24 tablet Commonly known as:  MYRBETRIQ Take 50 mg by mouth daily.   montelukast 10 MG tablet Commonly known as:  SINGULAIR Take 10 mg by mouth at bedtime.   MULTIVITAMIN GUMMIES ADULTS Chew Chew by mouth.   nortriptyline 50 MG capsule Commonly known as:  PAMELOR Take 50 mg by mouth at bedtime.   omega-3 acid ethyl esters 1 g capsule Commonly known as:  LOVAZA Take 1 g by mouth 2 (two) times a week. Monday and thursdays   OVER THE COUNTER MEDICATION Place 1 drop into both eyes daily as needed (dry eyes and allergies).   oxyCODONE-acetaminophen 10-325 MG tablet Commonly known as:  PERCOCET Take 1 tablet by mouth every 4 (four) hours as needed for pain.   potassium chloride SA 20 MEQ tablet Commonly known as:  K-DUR,KLOR-CON Take 20 mEq by mouth 3 (three) times a week. Monday,  Wednesday, and Saturday   PROBIOTIC COLON SUPPORT Caps Take 1 capsule by mouth at bedtime.   ramipril 10 MG capsule Commonly known as:  ALTACE Take 10 mg by mouth 2 (two) times daily.   sucralfate 1 g tablet Commonly known as:  CARAFATE Take 1 g by mouth 2 (two) times daily.   traMADol 50 MG tablet Commonly known as:  ULTRAM Take 2 tablets (100 mg total) by mouth 3 (three) times daily between meals. What changed:  how much to take  Vitamin D (Ergocalciferol) 50000 units Caps capsule Commonly known as:  DRISDOL Take 50,000 Units by mouth every 7 (seven) days. thursdays       Diagnostic Studies: Dg Knee Left Port  Result Date: 08/02/2015 CLINICAL DATA:  Status post left knee replacement EXAM: PORTABLE LEFT KNEE - 1-2 VIEW COMPARISON:  06/17/2015 FINDINGS: No fracture or dislocation is seen. Left knee arthroplasty in satisfactory position. Soft tissue gas. Overlying skin staples. IMPRESSION: Left knee arthroplasty in satisfactory position. Electronically Signed   By: Julian Hy M.D.   On: 08/02/2015 14:44    Disposition: 01-Home or Self Care  Discharge Instructions    Call MD / Call 911    Complete by:  As directed   If you experience chest pain or shortness of breath, CALL 911 and be transported to the hospital emergency room.  If you develope a fever above 101 F, pus ([place] drainage) or increased drainage or redness at the wound, or calf pain, call your surgeon's office.   Constipation Prevention    Complete by:  As directed   Drink plenty of fluids.  Prune juice may be helpful.  You may use a stool softener, such as Colace (over the counter) 100 mg twice a day.  Use MiraLax (over the counter) for constipation as needed.   Diet - low sodium heart healthy    Complete by:  As directed   Discharge patient    Complete by:  As directed   Increase activity slowly as tolerated    Complete by:  As directed         Signed: Mcarthur Rossetti 08/05/2015, 7:24  AM

## 2015-08-06 MED ORDER — ASPIRIN 325 MG PO TBEC: 325.0000 mg | DELAYED_RELEASE_TABLET | Freq: Two times a day (BID) | ORAL | 0 refills | Status: DC

## 2015-08-06 MED ORDER — CYCLOBENZAPRINE HCL 10 MG PO TABS: 10.0000 mg | ORAL_TABLET | Freq: Three times a day (TID) | ORAL | 0 refills | Status: DC | PRN

## 2015-08-06 MED ORDER — OXYCODONE-ACETAMINOPHEN 10-325 MG PO TABS: 1.0000 | ORAL_TABLET | ORAL | 0 refills | Status: DC | PRN

## 2015-08-06 NOTE — Discharge Instructions (Signed)

## 2015-08-06 NOTE — Discharge Summary (Signed)
Patient ID: Wendy Sanchez MRN: [id number] DOB/AGE: [birth year] 65 y.o.  Admit date: 08/02/2015 Discharge date: 08/06/2015  Admission Diagnoses:  Principal Problem:   Osteoarthritis of left knee Active Problems:   Status post total left knee replacement   Discharge Diagnoses:  Same  Past Medical History:  Diagnosis Date  . Allergic rhinitis   . Arthritis   . Complication of anesthesia    awakens during surgery; has occurred with last 3-4 surgeries   . Environmental allergies    fumes   . Fibromyalgia   . GERD (gastroesophageal reflux disease)   . History of bronchitis   . Hypertension   . Numbness    hands bilat when driving; improves when not preforming task   . Status post gastric banding   . Tinnitus    comes and goes   . Vertigo    6 months ago approx     Surgeries: Procedure(s): LEFT TOTAL KNEE ARTHROPLASTY on 08/02/2015   Consultants:   Discharged Condition: Improved  Hospital Course: MARIADE BUBIER is an 65 y.o. female who was admitted 08/02/2015 for operative treatment ofOsteoarthritis of left knee. Patient has severe unremitting pain that affects sleep, daily activities, and work/hobbies. After pre-op clearance the patient was taken to the operating room on 08/02/2015 and underwent  Procedure(s): LEFT TOTAL KNEE ARTHROPLASTY.    Patient was given perioperative antibiotics: Anti-infectives    Start     Dose/Rate Route Frequency Ordered Stop   08/04/15 1800  fluconazole (DIFLUCAN) tablet 150 mg     150 mg Oral  Once 08/04/15 1539 08/04/15 1711   08/02/15 1700  ceFAZolin (ANCEF) IVPB 2g/100 mL premix     2 g 200 mL/hr over 30 Minutes Intravenous Every 6 hours 08/02/15 1532 08/02/15 2303   08/02/15 0600  ceFAZolin (ANCEF) 3 g in dextrose 5 % 50 mL IVPB     3 g 130 mL/hr over 30 Minutes Intravenous On call to O.R. 08/01/15 1503 08/02/15 1129       Patient was given sequential compression devices, early ambulation, and chemoprophylaxis to prevent  DVT.  Patient benefited maximally from hospital stay and there were no complications.    Recent vital signs: Patient Vitals for the past 24 hrs:  BP Temp Temp src Pulse Resp SpO2  08/05/15 2136 (!) 83/53 98.2 F (36.8 C) Oral (!) 104 19 98 %  08/05/15 2009 - - - - - 97 %  08/05/15 1425 (!) 98/57 98.4 F (36.9 C) Oral 100 18 95 %  08/05/15 0758 - - - - - 98 %     Recent laboratory studies: No results for input(s): WBC, HGB, HCT, PLT, NA, K, CL, CO2, BUN, CREATININE, GLUCOSE, INR, CALCIUM in the last 72 hours.  Invalid input(s): PT, 2   Discharge Medications:     Medication List    TAKE these medications   albuterol 108 (90 Base) MCG/ACT inhaler Commonly known as:  PROVENTIL HFA;VENTOLIN HFA Inhale 2 puffs into the lungs every 6 (six) hours as needed for wheezing or shortness of breath.   aspirin 325 MG EC tablet Take 1 tablet (325 mg total) by mouth 2 (two) times daily after a meal.   azelastine 0.1 % nasal spray Commonly known as:  ASTELIN Place 1 spray into both nostrils 2 (two) times daily as needed for allergies. Use in each nostril as directed   clotrimazole 1 % cream Commonly known as:  LOTRIMIN Apply 1 application topically 2 (two) times daily as needed. prn  cyclobenzaprine 10 MG tablet Commonly known as:  FLEXERIL Take 1 tablet (10 mg total) by mouth 3 (three) times daily as needed for muscle spasms.   dexlansoprazole 60 MG capsule Commonly known as:  DEXILANT Take 60 mg by mouth daily.   diclofenac sodium 1 % Gel Commonly known as:  VOLTAREN Apply 2 g topically 4 (four) times daily.   fexofenadine 180 MG tablet Commonly known as:  ALLEGRA Take 180 mg by mouth daily.   FIRST-DUKES MOUTHWASH Susp Use as directed 5 mLs in the mouth or throat 3 (three) times daily as needed (mouth sores. Dukes magic mouthwash.).   fluconazole 150 MG tablet Commonly known as:  DIFLUCAN Take 150 mg by mouth once. Pt has currently not taken but does have prescription if  needed (prescription for 2 tabs) Instructions 1 tab, then repeat 1 wk - pt states if begins to itch can begin medication   Fluticasone-Salmeterol 250-50 MCG/DOSE Aepb Commonly known as:  ADVAIR Inhale 1 puff into the lungs 2 (two) times daily.   folic acid 1 MG tablet Commonly known as:  FOLVITE Take 1 mg by mouth daily.   furosemide 40 MG tablet Commonly known as:  LASIX Take 40 mg by mouth daily.   gabapentin 300 MG capsule Commonly known as:  NEURONTIN Take 2 capsules (600 mg total) by mouth 3 (three) times daily. Takes two capsules twice a day What changed:  additional instructions   ibuprofen 800 MG tablet Commonly known as:  ADVIL,MOTRIN Take 1 tablet (800 mg total) by mouth every 8 (eight) hours as needed. What changed:  reasons to take this   MAXZIDE 75-50 MG tablet Generic drug:  triamterene-hydrochlorothiazide Take 1 tablet by mouth daily as needed (edema).   metroNIDAZOLE 500 MG tablet Commonly known as:  FLAGYL Take 1,000 mg by mouth daily. Pt states has completed prescription 07/24/2015   mirabegron ER 50 MG Tb24 tablet Commonly known as:  MYRBETRIQ Take 50 mg by mouth daily.   montelukast 10 MG tablet Commonly known as:  SINGULAIR Take 10 mg by mouth at bedtime.   MULTIVITAMIN GUMMIES ADULTS Chew Chew by mouth.   nortriptyline 50 MG capsule Commonly known as:  PAMELOR Take 50 mg by mouth at bedtime.   omega-3 acid ethyl esters 1 g capsule Commonly known as:  LOVAZA Take 1 g by mouth 2 (two) times a week. Monday and thursdays   OVER THE COUNTER MEDICATION Place 1 drop into both eyes daily as needed (dry eyes and allergies).   oxyCODONE-acetaminophen 10-325 MG tablet Commonly known as:  PERCOCET Take 1 tablet by mouth every 4 (four) hours as needed for pain.   potassium chloride SA 20 MEQ tablet Commonly known as:  K-DUR,KLOR-CON Take 20 mEq by mouth 3 (three) times a week. Monday, Wednesday, and Saturday   PROBIOTIC COLON SUPPORT Caps Take 1  capsule by mouth at bedtime.   ramipril 10 MG capsule Commonly known as:  ALTACE Take 10 mg by mouth 2 (two) times daily.   sucralfate 1 g tablet Commonly known as:  CARAFATE Take 1 g by mouth 2 (two) times daily.   traMADol 50 MG tablet Commonly known as:  ULTRAM Take 2 tablets (100 mg total) by mouth 3 (three) times daily between meals. What changed:  how much to take   Vitamin D (Ergocalciferol) 50000 units Caps capsule Commonly known as:  DRISDOL Take 50,000 Units by mouth every 7 (seven) days. thursdays       Diagnostic Studies: Dg  Knee Left Port  Result Date: 08/02/2015 CLINICAL DATA:  Status post left knee replacement EXAM: PORTABLE LEFT KNEE - 1-2 VIEW COMPARISON:  06/17/2015 FINDINGS: No fracture or dislocation is seen. Left knee arthroplasty in satisfactory position. Soft tissue gas. Overlying skin staples. IMPRESSION: Left knee arthroplasty in satisfactory position. Electronically Signed   By: Julian Hy M.D.   On: 08/02/2015 14:44    Disposition: 01-Home or Self Care  Discharge Instructions    Call MD / Call 911    Complete by:  As directed   If you experience chest pain or shortness of breath, CALL 911 and be transported to the hospital emergency room.  If you develope a fever above 101 F, pus ([place] drainage) or increased drainage or redness at the wound, or calf pain, call your surgeon's office.   Call MD / Call 911    Complete by:  As directed   If you experience chest pain or shortness of breath, CALL 911 and be transported to the hospital emergency room.  If you develope a fever above 101 F, pus ([place] drainage) or increased drainage or redness at the wound, or calf pain, call your surgeon's office.   Constipation Prevention    Complete by:  As directed   Drink plenty of fluids.  Prune juice may be helpful.  You may use a stool softener, such as Colace (over the counter) 100 mg twice a day.  Use MiraLax (over the counter) for constipation as needed.    Constipation Prevention    Complete by:  As directed   Drink plenty of fluids.  Prune juice may be helpful.  You may use a stool softener, such as Colace (over the counter) 100 mg twice a day.  Use MiraLax (over the counter) for constipation as needed.   Diet - low sodium heart healthy    Complete by:  As directed   Diet - low sodium heart healthy    Complete by:  As directed   Discharge patient    Complete by:  As directed   Discharge patient    Complete by:  As directed   Increase activity slowly as tolerated    Complete by:  As directed   Increase activity slowly as tolerated    Complete by:  As directed      Follow-up Information    [facility] .   Why:  physical therapy Contact information: [address] [place] [address] [phone]        [facility] - [facility] .   Why:  3n1 and rolling walker Contact information: [place] [address] [place], [place] .   Specialty:  Orthopedic Surgery Contact information: [place] [address] [phone]            Signed: Mcarthur Rossetti 08/06/2015, 7:08 AM

## 2015-08-06 NOTE — Progress Notes (Signed)
Physical Therapy Treatment Patient Details Name: DAPHANE FOLK MRN: [id number] DOB: 1950-05-13 Today's Date: 08/06/2015    History of Present Illness Pt is s/p L TKA. Pt with PMH for HTN and arthoscopic sx L knee.     PT Comments    POD # 4 Assisted pt out of bathroom to amb then perform TKR TE's following HEP handout.  Instructed on proper tech and freq.  Pt declines use of ICE, stating it increases her pain.    Follow Up Recommendations  Home health PT (LPT eval indicated SNF, however plans are now for home.)     Equipment Recommendations       Recommendations for Other Services       Precautions / Restrictions Precautions Precautions: Knee;Fall Required Braces or Orthoses: Knee Immobilizer - Left Knee Immobilizer - Left: Discontinue once straight leg raise with < 10 degree lag Restrictions Weight Bearing Restrictions: No Other Position/Activity Restrictions: WBAT    Mobility  Bed Mobility Overal bed mobility: Needs Assistance Bed Mobility: Sit to Supine     Supine to sit: Supervision;HOB elevated Sit to supine: Supervision   General bed mobility comments: increased time and effort but able  Transfers Overall transfer level: Needs assistance Equipment used: Rolling walker (2 wheeled) Transfers: Sit to/from Stand Sit to Stand: Supervision            Ambulation/Gait Ambulation/Gait assistance: Supervision Ambulation Distance (Feet): 20 Feet Assistive device: Rolling walker (2 wheeled) Gait Pattern/deviations: Step-to pattern;Step-through pattern Gait velocity: decreased   General Gait Details: demonstartes good safety cognition and balance   Stairs Stairs:  (no stairs to enter home)          Wheelchair Mobility    Modified Rankin (Stroke Patients Only)       Balance                                    Cognition Arousal/Alertness: Awake/alert Behavior During Therapy: WFL for tasks assessed/performed Overall Cognitive  Status: Within Functional Limits for tasks assessed                      Exercises      General Comments        Pertinent Vitals/Pain Pain Assessment: 0-10 Pain Score: 5  Pain Location: L knee with TE's Pain Descriptors / Indicators: Aching;Sore Pain Intervention(s): Monitored during session;Repositioned    Home Living                      Prior Function            PT Goals (current goals can now be found in the care plan section) Acute Rehab PT Goals Patient Stated Goal: home today Progress towards PT goals: Progressing toward goals    Frequency  7X/week    PT Plan Current plan remains appropriate    Co-evaluation             End of Session Equipment Utilized During Treatment: Gait belt Activity Tolerance: Patient tolerated treatment well Patient left: in bed     Time: 1012-1041 PT Time Calculation (min) (ACUTE ONLY): 29 min  Charges:  $Gait Training: 8-22 mins $Therapeutic Exercise: 8-22 mins                    G Codes:      Rica Koyanagi  PTA [facility]  Acute  Rehab Pager  [phone]  

## 2015-08-06 NOTE — Progress Notes (Signed)
Pt to d/c home with [facility]. DME (RW) delivered to room prior to d/c. AVS reviewed and "My Chart" discussed with pt. Pt capable of verbalizing medications, signs and symptoms of infection, and follow-up appointments. Remains hemodynamically stable. No signs and symptoms of distress. Educated pt to return to ER in the case of SOB, dizziness, or chest pain.

## 2015-08-06 NOTE — Progress Notes (Signed)
Patient ID: Wendy Sanchez, female   DOB: 1950/09/06, 65 y.o.   MRN: [id number] Doing well.  Can go home today.

## 2015-08-06 NOTE — Progress Notes (Signed)
Occupational Therapy Treatment Patient Details Name: Wendy Sanchez MRN: [id number] DOB: 1950/04/08 Today's Date: 08/06/2015    History of present illness Pt is s/p L TKA. Pt with PMH for HTN and arthoscopic sx L knee.    OT comments  All OT education completed and pt questions answered. No further OT needs at this time; will sign off.  Follow Up Recommendations  No OT follow up;Supervision/Assistance - 24 hour    Equipment Recommendations  3 in 1 bedside comode    Recommendations for Other Services      Precautions / Restrictions Precautions Precautions: Knee;Fall Required Braces or Orthoses: Knee Immobilizer - Left Knee Immobilizer - Left: Discontinue once straight leg raise with < 10 degree lag Restrictions Weight Bearing Restrictions: No Other Position/Activity Restrictions: WBAT       Mobility Bed Mobility Overal bed mobility: Needs Assistance Bed Mobility: Supine to Sit;Sit to Supine     Supine to sit: Supervision;HOB elevated Sit to supine: Supervision   General bed mobility comments: increased time and effort  Transfers Overall transfer level: Needs assistance Equipment used: Rolling walker (2 wheeled) Transfers: Sit to/from Stand Sit to Stand: Supervision              Balance                                   ADL Overall ADL's : Needs assistance/impaired Eating/Feeding: Independent;Sitting   Grooming: Wash/dry hands;Wash/dry face;Oral care;Set up;Sitting   Upper Body Bathing: Set up;Sitting   Lower Body Bathing: Minimal assistance;Sit to/from stand   Upper Body Dressing : Set up;Sitting   Lower Body Dressing: Minimal assistance;Sit to/from stand   Toilet Transfer: Min guard;Supervision/safety;Ambulation;BSC;RW;Requires wide/bariatric   Toileting- Clothing Manipulation and Hygiene: Supervision/safety;Sit to/from Nurse, children's Details (indicate cue type and reason): verbal education/demo of tub transfer  with transfer bench Functional mobility during ADLs: Supervision/safety;Rolling walker General ADL Comments: Patient practiced LB self-care, in-room mobilization with RW, reviewed toilet/tub transfers with DME. Patient has good family support at home. She reports wide 3 in 1 is on order and will be delivered. No further OT needs at this time. Will sign off.      Vision                     Perception     Praxis      Cognition   Behavior During Therapy: WFL for tasks assessed/performed Overall Cognitive Status: Within Functional Limits for tasks assessed                       Extremity/Trunk Assessment               Exercises     Shoulder Instructions       General Comments      Pertinent Vitals/ Pain       Pain Assessment: 0-10 Pain Score: 3  Pain Location: L knee Pain Descriptors / Indicators: Aching;Sore Pain Intervention(s): Monitored during session;Repositioned  Home Living                                          Prior Functioning/Environment              Frequency       Progress Toward Goals  OT Goals(current goals can now be found in the care plan section)  Progress towards OT goals: Goals met/education completed, patient discharged from OT  Acute Rehab OT Goals Patient Stated Goal: home today OT Goal Formulation: All assessment and education complete, DC therapy  Plan All goals met and education completed, patient discharged from OT services    Co-evaluation                 End of Session Equipment Utilized During Treatment: Rolling walker;Left knee immobilizer   Activity Tolerance Patient tolerated treatment well   Patient Left in bed;with call bell/phone within reach   Nurse Communication Mobility status        Time: 4884-5733 OT Time Calculation (min): 26 min  Charges: OT General Charges $OT Visit: 1 Procedure OT Treatments $Self Care/Home Management : 23-37 mins  Homero Hyson  A 08/06/2015, 9:24 AM

## 2015-08-06 NOTE — Progress Notes (Signed)
CM contacted [facility] DME rep, Jermaine to please be aware house number on facesheet is incorrect and is [phone] NOT [phone] for delivery of bari commode.  Brenton Grills confirms he received message.  Pt's RW in room and pt is set up with [facility] fl[facility] HHPT.  NO other CM needs were communicated.

## 2015-09-17 ENCOUNTER — Encounter: Payer: Self-pay | Admitting: Anesthesiology

## 2015-09-17 ENCOUNTER — Ambulatory Visit: Payer: Medicare Other | Attending: Anesthesiology | Admitting: Anesthesiology

## 2015-09-17 VITALS — BP 113/81 | HR 90 | Temp 97.8°F | Resp 16 | Ht 68.0 in | Wt 304.0 lb

## 2015-09-17 DIAGNOSIS — E669 Obesity, unspecified: Secondary | ICD-10-CM | POA: Diagnosis not present

## 2015-09-17 DIAGNOSIS — M1990 Unspecified osteoarthritis, unspecified site: Secondary | ICD-10-CM | POA: Diagnosis not present

## 2015-09-17 DIAGNOSIS — M48061 Spinal stenosis, lumbar region without neurogenic claudication: Secondary | ICD-10-CM

## 2015-09-17 DIAGNOSIS — F17210 Nicotine dependence, cigarettes, uncomplicated: Secondary | ICD-10-CM | POA: Diagnosis not present

## 2015-09-17 DIAGNOSIS — Z6841 Body Mass Index (BMI) 40.0 and over, adult: Secondary | ICD-10-CM | POA: Diagnosis not present

## 2015-09-17 DIAGNOSIS — E785 Hyperlipidemia, unspecified: Secondary | ICD-10-CM | POA: Insufficient documentation

## 2015-09-17 DIAGNOSIS — M1288 Other specific arthropathies, not elsewhere classified, other specified site: Secondary | ICD-10-CM | POA: Insufficient documentation

## 2015-09-17 DIAGNOSIS — Z7982 Long term (current) use of aspirin: Secondary | ICD-10-CM | POA: Insufficient documentation

## 2015-09-17 DIAGNOSIS — B019 Varicella without complication: Secondary | ICD-10-CM | POA: Insufficient documentation

## 2015-09-17 DIAGNOSIS — Z96652 Presence of left artificial knee joint: Secondary | ICD-10-CM | POA: Diagnosis not present

## 2015-09-17 DIAGNOSIS — G894 Chronic pain syndrome: Secondary | ICD-10-CM | POA: Insufficient documentation

## 2015-09-17 DIAGNOSIS — J45909 Unspecified asthma, uncomplicated: Secondary | ICD-10-CM | POA: Diagnosis not present

## 2015-09-17 DIAGNOSIS — M5136 Other intervertebral disc degeneration, lumbar region: Secondary | ICD-10-CM | POA: Diagnosis not present

## 2015-09-17 DIAGNOSIS — M791 Myalgia: Secondary | ICD-10-CM | POA: Diagnosis present

## 2015-09-17 DIAGNOSIS — M4806 Spinal stenosis, lumbar region: Secondary | ICD-10-CM | POA: Diagnosis not present

## 2015-09-17 DIAGNOSIS — M1712 Unilateral primary osteoarthritis, left knee: Secondary | ICD-10-CM | POA: Insufficient documentation

## 2015-09-17 DIAGNOSIS — Z9884 Bariatric surgery status: Secondary | ICD-10-CM | POA: Diagnosis not present

## 2015-09-17 DIAGNOSIS — R1084 Generalized abdominal pain: Secondary | ICD-10-CM | POA: Insufficient documentation

## 2015-09-17 DIAGNOSIS — I10 Essential (primary) hypertension: Secondary | ICD-10-CM | POA: Insufficient documentation

## 2015-09-17 DIAGNOSIS — G43909 Migraine, unspecified, not intractable, without status migrainosus: Secondary | ICD-10-CM | POA: Insufficient documentation

## 2015-09-17 DIAGNOSIS — G4730 Sleep apnea, unspecified: Secondary | ICD-10-CM | POA: Diagnosis not present

## 2015-09-17 MED ORDER — OXYCODONE-ACETAMINOPHEN 10-325 MG PO TABS: 1.0000 | ORAL_TABLET | ORAL | 0 refills | Status: DC | PRN

## 2015-09-17 MED ORDER — GABAPENTIN 300 MG PO CAPS: 600.0000 mg | ORAL_CAPSULE | Freq: Three times a day (TID) | ORAL | 1 refills | Status: DC

## 2015-09-17 MED ORDER — IBUPROFEN 800 MG PO TABS: 800.0000 mg | ORAL_TABLET | Freq: Three times a day (TID) | ORAL | 5 refills | Status: DC

## 2015-09-17 NOTE — Progress Notes (Signed)
CC:  Diffuse body pain  Procedure:  None  HPI:  Wendy Sanchez continues to report reasonable pain control with her current medication regimen. Since her last visit she has had a left TKR.  That is doing well and most of her LE pain is on the opposite side.  As before, She reports today for reevaluation and has been doing well with her medication regimen. Based on her narcotic assessment sheet she is tolerating her medications without difficulty and has been compliant.  no change in the quality characteristic or distribution of her pain are mentioned today and otherwise things sound like they've been going well and have been stable.  .BP 113/81 (BP Location: Left Arm, Patient Position: Sitting, Cuff Size: Large)   Pulse 90   Temp 97.8 F (36.6 C) (Oral)   Resp 16   Ht 5\' 8"  (1.727 m)   Wt (!) 304 lb (137.9 kg)   SpO2 100%   BMI 46.22 kg/m    Current Outpatient Prescriptions:  .  albuterol (PROVENTIL HFA;VENTOLIN HFA) 108 (90 BASE) MCG/ACT inhaler, Inhale 2 puffs into the lungs every 6 (six) hours as needed for wheezing or shortness of breath., Disp: , Rfl:  .  azelastine (ASTELIN) 0.1 % nasal spray, Place 1 spray into both nostrils 2 (two) times daily as needed for allergies. Use in each nostril as directed, Disp: , Rfl:  .  clotrimazole (LOTRIMIN) 1 % cream, Apply 1 application topically 2 (two) times daily as needed. prn, Disp: , Rfl:  .  cyclobenzaprine (FLEXERIL) 10 MG tablet, Take 1 tablet (10 mg total) by mouth 3 (three) times daily as needed for muscle spasms., Disp: 60 tablet, Rfl: 0 .  dexlansoprazole (DEXILANT) 60 MG capsule, Take 60 mg by mouth daily., Disp: , Rfl:  .  diclofenac sodium (VOLTAREN) 1 % GEL, Apply 2 g topically 4 (four) times daily. , Disp: , Rfl:  .  fexofenadine (ALLEGRA) 180 MG tablet, Take 180 mg by mouth daily., Disp: , Rfl:  .  Fluticasone-Salmeterol (ADVAIR) 250-50 MCG/DOSE AEPB, Inhale 1 puff into the lungs 2 (two) times daily., Disp: , Rfl:  .  folic acid  (FOLVITE) 1 MG tablet, Take 1 mg by mouth daily., Disp: , Rfl:  .  furosemide (LASIX) 40 MG tablet, Take 40 mg by mouth daily. , Disp: , Rfl:  .  gabapentin (NEURONTIN) 300 MG capsule, Take 2 capsules (600 mg total) by mouth 3 (three) times daily., Disp: 180 capsule, Rfl: 1 .  ibuprofen (ADVIL,MOTRIN) 800 MG tablet, Take 1 tablet (800 mg total) by mouth 3 (three) times daily., Disp: 90 tablet, Rfl: 5 .  mirabegron ER (MYRBETRIQ) 50 MG TB24 tablet, Take 50 mg by mouth daily., Disp: , Rfl:  .  montelukast (SINGULAIR) 10 MG tablet, Take 10 mg by mouth at bedtime., Disp: , Rfl:  .  Multiple Vitamins-Minerals (MULTIVITAMIN GUMMIES ADULTS) CHEW, Chew by mouth., Disp: , Rfl:  .  nortriptyline (PAMELOR) 50 MG capsule, Take 50 mg by mouth at bedtime., Disp: , Rfl:  .  omega-3 acid ethyl esters (LOVAZA) 1 g capsule, Take 1 g by mouth 2 (two) times a week. Monday and thursdays, Disp: , Rfl:  .  oxyCODONE-acetaminophen (PERCOCET) 10-325 MG tablet, Take 1 tablet by mouth every 4 (four) hours as needed for pain., Disp: 90 tablet, Rfl: 0 .  potassium chloride SA (K-DUR,KLOR-CON) 20 MEQ tablet, Take 20 mEq by mouth 3 (three) times a week. Monday, Wednesday, and Saturday, Disp: , Rfl:  .  Probiotic Product (PROBIOTIC COLON SUPPORT) CAPS, Take 1 capsule by mouth at bedtime., Disp: , Rfl:  .  ramipril (ALTACE) 10 MG capsule, Take 10 mg by mouth 2 (two) times daily., Disp: , Rfl:  .  sucralfate (CARAFATE) 1 G tablet, Take 1 g by mouth 2 (two) times daily., Disp: , Rfl:  .  traMADol (ULTRAM) 50 MG tablet, Take 2 tablets (100 mg total) by mouth 3 (three) times daily between meals. (Patient taking differently: Take 50 mg by mouth 3 (three) times daily between meals. ), Disp: 90 tablet, Rfl: 3 .  triamterene-hydrochlorothiazide (MAXZIDE) 75-50 MG tablet, Take 1 tablet by mouth daily as needed (edema). , Disp: , Rfl:  .  Vitamin D, Ergocalciferol, (DRISDOL) 50000 UNITS CAPS capsule, Take 50,000 Units by mouth every 7 (seven)  days. thursdays, Disp: , Rfl:  .  aspirin EC 325 MG EC tablet, Take 1 tablet (325 mg total) by mouth 2 (two) times daily after a meal. (Patient not taking: Reported on 09/17/2015), Disp: 30 tablet, Rfl: 0 .  Diphenhyd-Hydrocort-Nystatin (FIRST-DUKES MOUTHWASH) SUSP, Use as directed 5 mLs in the mouth or throat 3 (three) times daily as needed (mouth sores. Dukes magic mouthwash.). (Patient not taking: Reported on 09/17/2015), Disp: 150 mL, Rfl: 0 .  fluconazole (DIFLUCAN) 150 MG tablet, Take 150 mg by mouth once. Pt has currently not taken but does have prescription if needed (prescription for 2 tabs) Instructions 1 tab, then repeat 1 wk - pt states if begins to itch can begin medication, Disp: , Rfl:  .  metroNIDAZOLE (FLAGYL) 500 MG tablet, Take 1,000 mg by mouth daily. Pt states has completed prescription 07/24/2015, Disp: , Rfl:  .  OVER THE COUNTER MEDICATION, Place 1 drop into both eyes daily as needed (dry eyes and allergies)., Disp: , Rfl:   Patient Active Problem List   Diagnosis Date Noted  . Osteoarthritis of left knee 08/02/2015  . 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 Spinal stenosis of lumbar region 06/19/2015  . Chronic pain syndrome 10/30/2014  's medical history significant for essential hypertension  Hyperlipidemia  Asthma Seasonal allergies Hemorrhoids  history of migraines  and obesity  generalized abdominal pain Osteoarthritis  Chickenpox  Cigarette smoking  Past surgeries include abdominal hysterectomy with partial vaginectomy  Gastric bypass 7  Rotator cuff repair  Left wrist surgery  Laparoscopy with gastroplasty and vertical banding for obesity       Allergies  Allergen Reactions  . Other Diarrhea and Swelling    Other reaction(s): Abdominal Pain CLAM CHOWDER   ALLERGY TO METAL - BLACKENS SKIN AND CAUSES A RASH   . Tape Swelling  . Iodine     family history significant for colon polyps hepatitis C and lupus  Physical exam:     PERRL, EOMI  Alert and Ox3  H:  RRR   Lungs:  CTA Inspection of the low back reveals paraspinous muscle tenderness but no overt trigger points. She ambulates well with no evidence of any significant lower extremity weakness. She does have tenderness about the knees low back and hips with motion. She also walks with an antalgic gait and requires a walker for assistance.And no other changes on examination today  Assessment:  1. 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  Sleep apnea   5.  Chronic narcotic usage for generalized pain   Plan:  1. We'll refill her medications with return to clinic in 2 month.   2 We talked about her need to proceed with efforts at continued weight loss and continue with physical  therapy exercises and continue follow-up with her primary care. We will refill her medications today. Furthermore I will allow her to take the Ultram instead of an additional OxyContin tablet for breakthrough pain. Refills of the Neurontin were given and this continues to work well. We have also talked about usage of the ibuprofen approximately 3-4 days per week and risks factors associated with chronic NSAIDs.  3. She would be a candidate for an epidural steroid injection to see if this could diminish some of the chronic low back pain with bilateral hip pain she has been experiencing.  Dr. Vashti Hey MD 6:56 PM

## 2015-09-17 NOTE — Progress Notes (Signed)
Patient here for medication management Safety precautions to be maintained throughout the outpatient stay will include: orient to surroundings, keep bed in low position, maintain call bell within reach at all times, provide assistance with transfer out of bed and ambulation.  

## 2015-11-07 ENCOUNTER — Encounter (INDEPENDENT_AMBULATORY_CARE_PROVIDER_SITE_OTHER): Payer: Self-pay | Admitting: Physician Assistant

## 2015-11-07 ENCOUNTER — Ambulatory Visit (INDEPENDENT_AMBULATORY_CARE_PROVIDER_SITE_OTHER): Payer: Medicare Other | Admitting: Physician Assistant

## 2015-11-07 DIAGNOSIS — Z96652 Presence of left artificial knee joint: Secondary | ICD-10-CM | POA: Diagnosis not present

## 2015-11-07 DIAGNOSIS — M1711 Unilateral primary osteoarthritis, right knee: Secondary | ICD-10-CM | POA: Diagnosis not present

## 2015-11-07 MED ORDER — HYDROCODONE-ACETAMINOPHEN 5-325 MG PO TABS: 1.0000 | ORAL_TABLET | Freq: Four times a day (QID) | ORAL | 0 refills | Status: DC | PRN

## 2015-11-07 NOTE — Progress Notes (Signed)
Office Visit Note   Patient: Wendy Sanchez           Date of Birth: 1950-12-17           MRN: [id number] Visit Date: 11/07/2015 Requested by: [name], MD [address] [facility] [place], [place] [address] PCP: [name], MD  Subjective: Chief Complaint  Patient presents with  . Right Knee - Pain    Patient returns for followup on right knee, to re evaluate for scheduling right TKA.  Patient is s/p left TKA August 05, 2015, 13.5 weeks out.  She says the left knee is doing great, she has been doing her HEP.  It feels stronger.  Dull ache/throbs at times.  She says she is ready to schedule the right one. States her right knee is slowing her from exercising. Having significant apin in the right knee at this time.                Review of Systems  All other systems reviewed and are negative.    Assessment & Plan: Visit Diagnoses:  1. Primary osteoarthritis of right knee   2. Presence of left artificial knee joint     Plan: Right total knee arthroplasty in near future. Patient understands risk of wound healing problems, nerve or vessel injury, DVT/PE , increased pain , prolonged pain and risk of infection.  Follow-Up Instructions: Return for post op 2 weeks.   Orders:  No orders of the defined types were placed in this encounter.  Meds ordered this encounter  Medications  . HYDROcodone-acetaminophen (NORCO) 5-325 MG tablet    Sig: Take 1-2 tablets by mouth every 6 (six) hours as needed for moderate pain. One to two tabs every 4-6 hours for pain    Dispense:  60 tablet    Refill:  0      Procedures: No procedures performed   Clinical Data: No additional findings.  Objective: Vital Signs: There were no vitals taken for this visit.  Physical Exam  Musculoskeletal:       Left knee: She exhibits no effusion.    Right Knee Exam   Tenderness  The patient is experiencing tenderness in the lateral joint line and medial joint line.  Range of  Motion  Extension: 5  Flexion: 100   Tests  Varus: negative Valgus: negative  Other  Erythema: absent   Left Knee Exam   Range of Motion  Extension: 0  Flexion: 110   Tests  Varus: negative Valgus: negative  Other  Erythema: absent Effusion: no effusion present  Comments:  Left knee surgical incision well healed      Specialty Comments:  No specialty comments available.  Imaging: No results found.   PMFS History: Patient Active Problem List   Diagnosis Date Noted  . Osteoarthritis of left knee 08/02/2015  . 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 Spinal stenosis of lumbar region 06/19/2015  . Chronic pain syndrome 10/30/2014   Past Medical History:  Diagnosis Date  . Allergic rhinitis   . Arthritis   . Complication of anesthesia    awakens during surgery; has occurred with last 3-4 surgeries   . Environmental allergies    fumes   . Fibromyalgia   . GERD (gastroesophageal reflux disease)   . History of bronchitis   . Hypertension   . Numbness    hands bilat when driving; improves when not preforming task   . Status post gastric banding   .  Tinnitus    comes and goes   . Vertigo    6 months ago approx     Family History  Problem Relation Age of Onset  . Hypertension Father   . Deep vein thrombosis Father   . Dementia Mother   . Diabetes Sister   . Congestive Heart Failure Sister   . Congestive Heart Failure Brother   . Congestive Heart Failure Daughter   . Congestive Heart Failure Son   . Breast cancer Maternal Aunt     great aunt    Past Surgical History:  Procedure Laterality Date  . ABDOMINAL HYSTERECTOMY  1979   left ovary remains  . ABDOMINAL HYSTERECTOMY    . fibrous tissue removed from right shoulder and back of neck      2 years ago   . GANGLION CYST EXCISION    . KNEE SURGERY Bilateral    both knees  2001  . TONSILLECTOMY     age 62  . TOTAL KNEE ARTHROPLASTY Left 08/02/2015   Procedure: LEFT TOTAL KNEE  ARTHROPLASTY;  Surgeon: Mcarthur Rossetti, MD;  Location: [facility];  Service: Orthopedics;  Laterality: Left;   Social History   Occupational History  . Not on file.   Social History Main Topics  . Smoking status: Current Every Day Smoker    Packs/day: 0.25    Years: 50.00    Types: Cigarettes  . Smokeless tobacco: Former Systems developer    Types: Snuff, Sarina Ser    Quit date: 01/13/1995     Comment: pt is determined to quit, she has cut down her cigarettes by 2/3 what she was smoking  . Alcohol use No  . Drug use: No  . Sexual activity: Not on file

## 2015-11-14 NOTE — Progress Notes (Signed)
Need orders in epic for 12-1- surgery

## 2015-11-18 ENCOUNTER — Ambulatory Visit: Payer: Medicare Other | Attending: Anesthesiology | Admitting: Anesthesiology

## 2015-11-18 ENCOUNTER — Encounter: Payer: Self-pay | Admitting: Anesthesiology

## 2015-11-18 VITALS — BP 139/92 | HR 87 | Temp 98.0°F | Resp 18 | Ht 68.0 in | Wt 304.0 lb

## 2015-11-18 DIAGNOSIS — G894 Chronic pain syndrome: Secondary | ICD-10-CM

## 2015-11-18 DIAGNOSIS — M5136 Other intervertebral disc degeneration, lumbar region: Secondary | ICD-10-CM

## 2015-11-18 DIAGNOSIS — M1712 Unilateral primary osteoarthritis, left knee: Secondary | ICD-10-CM

## 2015-11-18 DIAGNOSIS — M48061 Spinal stenosis, lumbar region without neurogenic claudication: Secondary | ICD-10-CM | POA: Diagnosis not present

## 2015-11-18 MED ORDER — HYDROCODONE-ACETAMINOPHEN 5-325 MG PO TABS: 1.0000 | ORAL_TABLET | Freq: Four times a day (QID) | ORAL | 0 refills | Status: DC | PRN

## 2015-11-18 MED ORDER — OXYCODONE-ACETAMINOPHEN 10-325 MG PO TABS: 1.0000 | ORAL_TABLET | ORAL | 0 refills | Status: DC | PRN

## 2015-11-18 NOTE — Progress Notes (Signed)
Safety precautions to be maintained throughout the outpatient stay will include: orient to surroundings, keep bed in low position, maintain call bell within reach at all times, provide assistance with transfer out of bed and ambulation.  

## 2015-11-19 NOTE — Progress Notes (Signed)
CC:  Diffuse body pain  Procedure:  None  HPI:  Wendy Sanchez continues to report reasonable pain control with her current medication regimen. Since her last visit she has had a left TKR.  That is doing well and most of her LE pain is on the opposite side. She continues to report on her narcotic assessment sheet that her medications are keeping her pain under reasonable control and she maintains that she's been cooperative and compliant with her regimen. Of concern the [place] controlled substance reporting system the notes that Dr. Rush Farmer has also been prescribing oxycodone for her. This was brought to her attention today. She denies this.  .BP (!) 139/92   Pulse 87   Temp 98 F (36.7 C) (Oral)   Resp 18   Ht 5\' 8"  (1.727 m)   Wt (!) 304 lb (137.9 kg)   SpO2 100%   BMI 46.22 kg/m    Current Outpatient Prescriptions:  .  albuterol (PROVENTIL HFA;VENTOLIN HFA) 108 (90 BASE) MCG/ACT inhaler, Inhale 2 puffs into the lungs every 6 (six) hours as needed for wheezing or shortness of breath., Disp: , Rfl:  .  azelastine (ASTELIN) 0.1 % nasal spray, Place 1 spray into both nostrils 2 (two) times daily as needed for allergies. Use in each nostril as directed, Disp: , Rfl:  .  clotrimazole (LOTRIMIN) 1 % cream, Apply 1 application topically 2 (two) times daily as needed. prn, Disp: , Rfl:  .  cyclobenzaprine (FLEXERIL) 10 MG tablet, Take 1 tablet (10 mg total) by mouth 3 (three) times daily as needed for muscle spasms., Disp: 60 tablet, Rfl: 0 .  dexlansoprazole (DEXILANT) 60 MG capsule, Take 60 mg by mouth daily., Disp: , Rfl:  .  diclofenac sodium (VOLTAREN) 1 % GEL, Apply 2 g topically 4 (four) times daily. , Disp: , Rfl:  .  Fluticasone-Salmeterol (ADVAIR) 250-50 MCG/DOSE AEPB, Inhale 1 puff into the lungs 2 (two) times daily., Disp: , Rfl:  .  folic acid (FOLVITE) 1 MG tablet, Take 1 mg by mouth daily., Disp: , Rfl:  .  gabapentin (NEURONTIN) 300 MG capsule, Take 2 capsules (600 mg total) by  mouth 3 (three) times daily., Disp: 180 capsule, Rfl: 1 .  ibuprofen (ADVIL,MOTRIN) 800 MG tablet, Take 1 tablet (800 mg total) by mouth 3 (three) times daily., Disp: 90 tablet, Rfl: 5 .  levocetirizine (XYZAL) 5 MG tablet, Take 5 mg by mouth every evening., Disp: , Rfl:  .  mirabegron ER (MYRBETRIQ) 50 MG TB24 tablet, Take 50 mg by mouth daily., Disp: , Rfl:  .  montelukast (SINGULAIR) 10 MG tablet, Take 10 mg by mouth at bedtime., Disp: , Rfl:  .  Multiple Vitamins-Minerals (ICAPS AREDS 2) CAPS, Take by mouth., Disp: , Rfl:  .  Multiple Vitamins-Minerals (MULTIVITAMIN GUMMIES ADULTS) CHEW, Chew by mouth., Disp: , Rfl:  .  nortriptyline (PAMELOR) 50 MG capsule, Take 50 mg by mouth at bedtime., Disp: , Rfl:  .  omega-3 acid ethyl esters (LOVAZA) 1 g capsule, Take 1 g by mouth 2 (two) times a week. Monday and thursdays, Disp: , Rfl:  .  potassium chloride SA (K-DUR,KLOR-CON) 20 MEQ tablet, Take 20 mEq by mouth 3 (three) times a week. Monday, Wednesday, and Saturday, Disp: , Rfl:  .  Probiotic Product (PROBIOTIC COLON SUPPORT) CAPS, Take 1 capsule by mouth at bedtime., Disp: , Rfl:  .  ramipril (ALTACE) 10 MG capsule, Take 10 mg by mouth 2 (two) times daily., Disp: , Rfl:  .  sucralfate (CARAFATE) 1 G tablet, Take 1 g by mouth 2 (two) times daily., Disp: , Rfl:  .  triamterene-hydrochlorothiazide (MAXZIDE) 75-50 MG tablet, Take 1 tablet by mouth daily as needed (edema). , Disp: , Rfl:  .  Vitamin D, Ergocalciferol, (DRISDOL) 50000 UNITS CAPS capsule, Take 50,000 Units by mouth every 7 (seven) days. thursdays, Disp: , Rfl:  .  aspirin EC 325 MG EC tablet, Take 1 tablet (325 mg total) by mouth 2 (two) times daily after a meal. (Patient not taking: Reported on 11/18/2015), Disp: 30 tablet, Rfl: 0 .  Diphenhyd-Hydrocort-Nystatin (FIRST-DUKES MOUTHWASH) SUSP, Use as directed 5 mLs in the mouth or throat 3 (three) times daily as needed (mouth sores. Dukes magic mouthwash.). (Patient not taking: Reported on  11/18/2015), Disp: 150 mL, Rfl: 0 .  fexofenadine (ALLEGRA) 180 MG tablet, Take 180 mg by mouth daily., Disp: , Rfl:  .  fluconazole (DIFLUCAN) 150 MG tablet, Take 150 mg by mouth once. Pt has currently not taken but does have prescription if needed (prescription for 2 tabs) Instructions 1 tab, then repeat 1 wk - pt states if begins to itch can begin medication, Disp: , Rfl:  .  furosemide (LASIX) 40 MG tablet, Take 40 mg by mouth daily. , Disp: , Rfl:  .  HYDROcodone-acetaminophen (NORCO) 5-325 MG tablet, Take 1-2 tablets by mouth every 6 (six) hours as needed for moderate pain. One to two tabs every 4-6 hours for pain, Disp: 60 tablet, Rfl: 0 .  metroNIDAZOLE (FLAGYL) 500 MG tablet, Take 1,000 mg by mouth daily. Pt states has completed prescription 07/24/2015, Disp: , Rfl:  .  OVER THE COUNTER MEDICATION, Place 1 drop into both eyes daily as needed (dry eyes and allergies)., Disp: , Rfl:  .  oxyCODONE-acetaminophen (PERCOCET) 10-325 MG tablet, Take 1 tablet by mouth every 4 (four) hours as needed for pain., Disp: 90 tablet, Rfl: 0 .  traMADol (ULTRAM) 50 MG tablet, Take 2 tablets (100 mg total) by mouth 3 (three) times daily between meals. (Patient not taking: Reported on 11/18/2015), Disp: 90 tablet, Rfl: 3  Patient Active Problem List   Diagnosis Date Noted  . Osteoarthritis of left knee 08/02/2015  . 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 Spinal stenosis of lumbar region 06/19/2015  . Chronic pain syndrome 10/30/2014  's medical history significant for essential hypertension  Hyperlipidemia  Asthma Seasonal allergies Hemorrhoids  history of migraines  and obesity  generalized abdominal pain Osteoarthritis  Chickenpox  Cigarette smoking  Past surgeries include abdominal hysterectomy with partial vaginectomy  Gastric bypass 7  Rotator cuff repair  Left wrist surgery  Laparoscopy with gastroplasty and vertical banding for obesity       Allergies  Allergen  Reactions  . Other Diarrhea and Swelling    Other reaction(s): Abdominal Pain CLAM CHOWDER   ALLERGY TO METAL - BLACKENS SKIN AND CAUSES A RASH   . Tape Swelling  . Iodine     family history significant for colon polyps hepatitis C and lupus  Physical exam:    PERRL, EOMI  Alert and Ox3  H:  RRR   Lungs:  CTA Inspection of the low back reveals paraspinous muscle tenderness but no overt trigger points. She ambulates well with no evidence of any significant lower extremity weakness. She does have tenderness about the knees low back and hips with motion. She also walks with an antalgic gait and requires a walker for assistance.And no other changes on examination today  Assessment:  1. 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  Sleep apnea   5.  Chronic narcotic usage for generalized pain   6.  Evidence of multi prescriber opioid prescription  Plan:    1. I have counseled Fay regarding these findings today. She denies receiving medications from any other source. We are going to confirm this with the pharmacy and I have told her that should she receive any other opioids from any other sources that this will be a breach of her narcotic contract and she may be terminated from the clinic and that this may be reported. We will have her return to clinic in 1 month accordingly and she was given a refill 1 today  2 We talked about her need to proceed with efforts at continued weight loss and continue with physical  therapy exercises and continue follow-up with her primary care.   3. She would be a candidate for an epidural steroid injection to see if this could diminish some of the chronic low back pain with bilateral hip pain she has been experiencing. She continues to refuse this  Dr. Vashti Hey MD 7:53 PM

## 2015-11-21 ENCOUNTER — Telehealth (INDEPENDENT_AMBULATORY_CARE_PROVIDER_SITE_OTHER): Payer: Self-pay | Admitting: Orthopaedic Surgery

## 2015-11-21 NOTE — Telephone Encounter (Signed)
FYI

## 2015-11-21 NOTE — Telephone Encounter (Signed)
Wendy Sanchez from [facility] called advised patient is getting medication refills (narcotics) from Dr Vashti Hey office as well as Dr Ninfa Linden. The number to contact Wendy Sanchez is [phone]

## 2015-11-25 ENCOUNTER — Other Ambulatory Visit (INDEPENDENT_AMBULATORY_CARE_PROVIDER_SITE_OTHER): Payer: Self-pay | Admitting: Physician Assistant

## 2015-11-25 LAB — TOXASSURE SELECT 13 (MW), URINE

## 2015-12-03 NOTE — Patient Instructions (Signed)
Wendy Sanchez  12/03/2015   Your procedure is scheduled on: Friday 12/13/2015  Report to [facility] Main  Entrance take [place]  elevators to 3rd floor to  [place] at   [place]   AM.  Call this number if you have problems the morning of surgery [phone]   Remember: ONLY 1 PERSON MAY GO WITH YOU TO SHORT STAY TO GET  READY MORNING OF [place].   Do not eat food or drink liquids :After Midnight.     Take these medicines the morning of surgery with A SIP OF WATER:Gabapentin (Neurontin), Advair inhaler, Astelin nasal spray if needed, Albuterol inhaler if needed                                  You may not have any metal on your body including hair pins and              piercings  Do not wear jewelry, make-up, lotions, powders or perfumes, deodorant             Do not wear nail polish.  Do not shave  48 hours prior to surgery.              Men may shave face and neck.   Do not bring valuables to the hospital. [place].  Contacts, dentures or bridgework may not be worn into surgery.  Leave suitcase in the car. After surgery it may be brought to your room.                  Please read over the following fact sheets you were given: _____________________________________________________________________   DO NOT [place] - Preparing for Surgery Before surgery, you can play an important role.  Because skin is not sterile, your skin needs to be as free of germs as possible.  You can reduce the number of germs on your skin by washing with CHG (chlorahexidine gluconate) soap before surgery.  CHG is an antiseptic cleaner which kills germs and bonds with the skin to continue killing germs even after washing. Please DO NOT use if you have an allergy to CHG or antibacterial soaps.  If your skin becomes reddened/irritated stop using the CHG and inform your nurse when you  arrive at Short Stay. Do not shave (including legs and underarms) for at least 48 hours prior to the first CHG shower.  You may shave your face/neck. Please follow these instructions carefully:  1.  Shower with CHG Soap the night before surgery and the  morning of Surgery.  2.  If you choose to wash your hair, wash your hair first as usual with your  normal  shampoo.  3.  After you shampoo, rinse your hair and body thoroughly to remove the  shampoo.                           4.  Use CHG as you would any other liquid soap.  You can apply chg directly  to the skin and wash  Gently with a scrungie or clean washcloth.  5.  Apply the CHG Soap to your body ONLY FROM THE NECK DOWN.   Do not use on face/ open                           Wound or open sores. Avoid contact with eyes, ears mouth and genitals (private parts).                       Wash face,  Genitals (private parts) with your normal soap.             6.  Wash thoroughly, paying special attention to the area where your surgery  will be performed.  7.  Thoroughly rinse your body with warm water from the neck down.  8.  DO NOT shower/wash with your normal soap after using and rinsing off  the CHG Soap.                9.  Pat yourself dry with a clean towel.            10.  Wear clean pajamas.            11.  Place clean sheets on your bed the night of your first shower and do not  sleep with pets. Day of Surgery : Do not apply any lotions/deodorants the morning of surgery.  Please wear clean clothes to the hospital/surgery center.  FAILURE TO FOLLOW THESE INSTRUCTIONS MAY RESULT IN THE CANCELLATION OF YOUR SURGERY PATIENT SIGNATURE_________________________________  NURSE SIGNATURE__________________________________  ________________________________________________________________________   Adam Phenix  An incentive spirometer is a tool that can help keep your lungs clear and active. This tool measures how  well you are filling your lungs with each breath. Taking long deep breaths may help reverse or decrease the chance of developing breathing (pulmonary) problems (especially infection) following:  A long period of time when you are unable to move or be active. BEFORE THE PROCEDURE   If the spirometer includes an indicator to show your best effort, your nurse or respiratory therapist will set it to a desired goal.  If possible, sit up straight or lean slightly forward. Try not to slouch.  Hold the incentive spirometer in an upright position. INSTRUCTIONS FOR USE  1. Sit on the edge of your bed if possible, or sit up as far as you can in bed or on a chair. 2. Hold the incentive spirometer in an upright position. 3. Breathe out normally. 4. Place the mouthpiece in your mouth and seal your lips tightly around it. 5. Breathe in slowly and as deeply as possible, raising the piston or the ball toward the top of the column. 6. Hold your breath for 3-5 seconds or for as long as possible. Allow the piston or ball to fall to the bottom of the column. 7. Remove the mouthpiece from your mouth and breathe out normally. 8. Rest for a few seconds and repeat Steps 1 through 7 at least 10 times every 1-2 hours when you are awake. Take your time and take a few normal breaths between deep breaths. 9. The spirometer may include an indicator to show your best effort. Use the indicator as a goal to work toward during each repetition. 10. After each set of 10 deep breaths, practice coughing to be sure your lungs are clear. If you have an incision (the cut made at the time of surgery),  support your incision when coughing by placing a pillow or rolled up towels firmly against it. Once you are able to get out of bed, walk around indoors and cough well. You may stop using the incentive spirometer when instructed by your caregiver.  RISKS AND COMPLICATIONS  Take your time so you do not get dizzy or light-headed.  If you  are in pain, you may need to take or ask for pain medication before doing incentive spirometry. It is harder to take a deep breath if you are having pain. AFTER USE  Rest and breathe slowly and easily.  It can be helpful to keep track of a log of your progress. Your caregiver can provide you with a simple table to help with this. If you are using the spirometer at home, follow these instructions: [place] IF:   You are having difficultly using the spirometer.  You have trouble using the spirometer as often as instructed.  Your pain medication is not giving enough relief while using the spirometer.  You develop fever of 100.5 F (38.1 C) or higher. SEEK IMMEDIATE MEDICAL CARE IF:   You cough up bloody sputum that had not been present before.  You develop fever of 102 F (38.9 C) or greater.  You develop worsening pain at or near the incision site. MAKE SURE YOU:   Understand these instructions.  Will watch your condition.  Will get help right away if you are not doing well or get worse. Document Released: 05/11/2006 Document Revised: 03/23/2011 Document Reviewed: 07/12/2006 [facility] Patient Information 2014 [place], [place].   ________________________________________________________________________

## 2015-12-04 ENCOUNTER — Encounter (HOSPITAL_COMMUNITY)
Admission: RE | Admit: 2015-12-04 | Discharge: 2015-12-04 | Disposition: A | Discharge status: Home / Self Care | Payer: Medicare Other | Source: Ambulatory Visit | Attending: Orthopaedic Surgery | Admitting: Orthopaedic Surgery

## 2015-12-04 ENCOUNTER — Encounter (HOSPITAL_COMMUNITY): Payer: Self-pay

## 2015-12-04 DIAGNOSIS — M1711 Unilateral primary osteoarthritis, right knee: Secondary | ICD-10-CM | POA: Diagnosis not present

## 2015-12-04 DIAGNOSIS — Z01812 Encounter for preprocedural laboratory examination: Secondary | ICD-10-CM | POA: Diagnosis not present

## 2015-12-04 HISTORY — DX: Unspecified asthma, uncomplicated: J45.909

## 2015-12-04 LAB — BASIC METABOLIC PANEL
ANION GAP: 7 (ref 5–15)
BUN: 19 mg/dL (ref 6–20)
CALCIUM: 9.2 mg/dL (ref 8.9–10.3)
CO2: 25 mmol/L (ref 22–32)
CREATININE: 0.8 mg/dL (ref 0.44–1.00)
Chloride: 109 mmol/L (ref 101–111)
GFR calc Af Amer: >60 mL/min (ref >60)
GLUCOSE: 90 mg/dL (ref 65–99)
Potassium: 4.5 mmol/L (ref 3.5–5.1)
Sodium: 141 mmol/L (ref 135–145)

## 2015-12-04 LAB — CBC
HCT: 34.8 % — ABNORMAL LOW (ref 36.0–46.0)
Hemoglobin: 10.9 g/dL — ABNORMAL LOW (ref 12.0–15.0)
MCH: 25.4 pg — ABNORMAL LOW (ref 26.0–34.0)
MCHC: 31.3 g/dL (ref 30.0–36.0)
MCV: 81.1 fL (ref 78.0–100.0)
Platelets: 367 10*3/uL (ref 150–400)
RBC: 4.29 MIL/uL (ref 3.87–5.11)
RDW: 17.4 % — ABNORMAL HIGH (ref 11.5–15.5)
WBC: 5 10*3/uL (ref 4.0–10.5)

## 2015-12-04 LAB — SURGICAL PCR SCREEN
MRSA, PCR: NEGATIVE
STAPHYLOCOCCUS AUREUS: NEGATIVE

## 2015-12-12 MED ORDER — DEXTROSE 5 % IV SOLN
3.0000 g | INTRAVENOUS | Status: AC
  Administered 2015-12-13: 3 g via INTRAVENOUS
  Filled 2015-12-12: qty 3

## 2015-12-13 ENCOUNTER — Inpatient Hospital Stay (HOSPITAL_COMMUNITY): Payer: Medicare Other | Admitting: Anesthesiology

## 2015-12-13 ENCOUNTER — Inpatient Hospital Stay (HOSPITAL_COMMUNITY): Payer: Medicare Other

## 2015-12-13 ENCOUNTER — Encounter (HOSPITAL_COMMUNITY)
Admission: RE | Disposition: A | Discharge status: Home Health | Payer: Self-pay | Source: Ambulatory Visit | Attending: Orthopaedic Surgery

## 2015-12-13 ENCOUNTER — Encounter (HOSPITAL_COMMUNITY): Payer: Self-pay | Admitting: *Deleted

## 2015-12-13 ENCOUNTER — Inpatient Hospital Stay (HOSPITAL_COMMUNITY)
Admission: RE | Admit: 2015-12-13 | Discharge: 2015-12-16 | DRG: 470 | Disposition: A | Discharge status: Home Health | Payer: Medicare Other | Source: Ambulatory Visit | Attending: Orthopaedic Surgery | Admitting: Orthopaedic Surgery

## 2015-12-13 DIAGNOSIS — Z79891 Long term (current) use of opiate analgesic: Secondary | ICD-10-CM

## 2015-12-13 DIAGNOSIS — Z7951 Long term (current) use of inhaled steroids: Secondary | ICD-10-CM

## 2015-12-13 DIAGNOSIS — Z791 Long term (current) use of non-steroidal anti-inflammatories (NSAID): Secondary | ICD-10-CM | POA: Diagnosis not present

## 2015-12-13 DIAGNOSIS — Z981 Arthrodesis status: Secondary | ICD-10-CM | POA: Diagnosis not present

## 2015-12-13 DIAGNOSIS — M1711 Unilateral primary osteoarthritis, right knee: Secondary | ICD-10-CM | POA: Diagnosis not present

## 2015-12-13 DIAGNOSIS — Z6841 Body Mass Index (BMI) 40.0 and over, adult: Secondary | ICD-10-CM | POA: Diagnosis not present

## 2015-12-13 DIAGNOSIS — Z96652 Presence of left artificial knee joint: Secondary | ICD-10-CM | POA: Diagnosis present

## 2015-12-13 DIAGNOSIS — F17210 Nicotine dependence, cigarettes, uncomplicated: Secondary | ICD-10-CM | POA: Diagnosis not present

## 2015-12-13 DIAGNOSIS — D62 Acute posthemorrhagic anemia: Secondary | ICD-10-CM | POA: Diagnosis not present

## 2015-12-13 DIAGNOSIS — Z883 Allergy status to other anti-infective agents status: Secondary | ICD-10-CM | POA: Diagnosis not present

## 2015-12-13 DIAGNOSIS — Z96659 Presence of unspecified artificial knee joint: Secondary | ICD-10-CM

## 2015-12-13 DIAGNOSIS — M1712 Unilateral primary osteoarthritis, left knee: Secondary | ICD-10-CM | POA: Diagnosis present

## 2015-12-13 DIAGNOSIS — I10 Essential (primary) hypertension: Secondary | ICD-10-CM | POA: Diagnosis not present

## 2015-12-13 DIAGNOSIS — K219 Gastro-esophageal reflux disease without esophagitis: Secondary | ICD-10-CM | POA: Diagnosis not present

## 2015-12-13 DIAGNOSIS — M797 Fibromyalgia: Secondary | ICD-10-CM | POA: Diagnosis not present

## 2015-12-13 DIAGNOSIS — G894 Chronic pain syndrome: Secondary | ICD-10-CM | POA: Diagnosis present

## 2015-12-13 DIAGNOSIS — Z7982 Long term (current) use of aspirin: Secondary | ICD-10-CM

## 2015-12-13 DIAGNOSIS — J45909 Unspecified asthma, uncomplicated: Secondary | ICD-10-CM | POA: Diagnosis not present

## 2015-12-13 DIAGNOSIS — Z9884 Bariatric surgery status: Secondary | ICD-10-CM

## 2015-12-13 DIAGNOSIS — Z96651 Presence of right artificial knee joint: Secondary | ICD-10-CM

## 2015-12-13 DIAGNOSIS — Z91048 Other nonmedicinal substance allergy status: Secondary | ICD-10-CM

## 2015-12-13 HISTORY — DX: Presence of unspecified artificial knee joint: Z96.659

## 2015-12-13 HISTORY — DX: Unilateral primary osteoarthritis, right knee: M17.11

## 2015-12-13 HISTORY — PX: TOTAL KNEE ARTHROPLASTY: SHX125

## 2015-12-13 SURGERY — ARTHROPLASTY, KNEE, TOTAL
Anesthesia: Spinal | Site: Knee | Laterality: Right

## 2015-12-13 MED ORDER — POLYETHYLENE GLYCOL 3350 17 G PO PACK: 17.0000 g | PACK | Freq: Every day | ORAL | Status: DC | PRN

## 2015-12-13 MED ORDER — MIDAZOLAM HCL 2 MG/2ML IJ SOLN
INTRAMUSCULAR | Status: AC
  Filled 2015-12-13: qty 2

## 2015-12-13 MED ORDER — MOMETASONE FURO-FORMOTEROL FUM 200-5 MCG/ACT IN AERO
2.0000 | INHALATION_SPRAY | Freq: Two times a day (BID) | RESPIRATORY_TRACT | Status: DC
  Administered 2015-12-13 – 2015-12-16 (×6): 2 via RESPIRATORY_TRACT
  Filled 2015-12-13: qty 8.8

## 2015-12-13 MED ORDER — MONTELUKAST SODIUM 10 MG PO TABS
10.0000 mg | ORAL_TABLET | Freq: Every day | ORAL | Status: DC
  Administered 2015-12-13 – 2015-12-15 (×3): 10 mg via ORAL
  Filled 2015-12-13 (×3): qty 1

## 2015-12-13 MED ORDER — HYDROMORPHONE HCL 1 MG/ML IJ SOLN
0.2500 mg | INTRAMUSCULAR | Status: DC | PRN
  Administered 2015-12-13 (×4): 0.5 mg via INTRAVENOUS

## 2015-12-13 MED ORDER — SODIUM CHLORIDE 0.9 % IV SOLN
1000.0000 mg | INTRAVENOUS | Status: DC
  Administered 2015-12-13: 1000 mg via INTRAVENOUS
  Filled 2015-12-13: qty 10

## 2015-12-13 MED ORDER — PROPOFOL 10 MG/ML IV BOLUS
INTRAVENOUS | Status: AC
  Filled 2015-12-13: qty 40

## 2015-12-13 MED ORDER — LACTATED RINGERS IV SOLN
INTRAVENOUS | Status: DC | PRN
  Administered 2015-12-13 (×3): via INTRAVENOUS

## 2015-12-13 MED ORDER — HYDROMORPHONE HCL 1 MG/ML IJ SOLN
INTRAMUSCULAR | Status: AC
  Administered 2015-12-13: 0.5 mg via INTRAVENOUS
  Filled 2015-12-13: qty 1

## 2015-12-13 MED ORDER — ONDANSETRON HCL 4 MG/2ML IJ SOLN: 4.0000 mg | Freq: Four times a day (QID) | INTRAMUSCULAR | Status: DC | PRN

## 2015-12-13 MED ORDER — MENTHOL 3 MG MT LOZG: 1.0000 | LOZENGE | OROMUCOSAL | Status: DC | PRN

## 2015-12-13 MED ORDER — OXYCODONE HCL 5 MG/5ML PO SOLN
5.0000 mg | Freq: Once | ORAL | Status: DC | PRN
  Filled 2015-12-13: qty 5

## 2015-12-13 MED ORDER — DIPHENHYDRAMINE HCL 12.5 MG/5ML PO ELIX
12.5000 mg | ORAL_SOLUTION | ORAL | Status: DC | PRN
  Administered 2015-12-16: 12.5 mg via ORAL
  Filled 2015-12-13: qty 5

## 2015-12-13 MED ORDER — DEXAMETHASONE SODIUM PHOSPHATE 10 MG/ML IJ SOLN
INTRAMUSCULAR | Status: DC | PRN
  Administered 2015-12-13: 10 mg via INTRAVENOUS

## 2015-12-13 MED ORDER — METHOCARBAMOL 500 MG PO TABS
500.0000 mg | ORAL_TABLET | Freq: Four times a day (QID) | ORAL | Status: DC | PRN
  Administered 2015-12-13 – 2015-12-16 (×6): 500 mg via ORAL
  Filled 2015-12-13 (×7): qty 1

## 2015-12-13 MED ORDER — ROCURONIUM BROMIDE 10 MG/ML (PF) SYRINGE
PREFILLED_SYRINGE | INTRAVENOUS | Status: DC | PRN
  Administered 2015-12-13: 50 mg via INTRAVENOUS
  Administered 2015-12-13 (×2): 20 mg via INTRAVENOUS

## 2015-12-13 MED ORDER — ACETAMINOPHEN 10 MG/ML IV SOLN: 1000.0000 mg | Freq: Once | INTRAVENOUS | Status: DC

## 2015-12-13 MED ORDER — OXYCODONE HCL 5 MG PO TABS
5.0000 mg | ORAL_TABLET | ORAL | Status: DC | PRN
  Administered 2015-12-13: 15 mg via ORAL
  Administered 2015-12-13: 10 mg via ORAL
  Administered 2015-12-13: 15 mg via ORAL
  Administered 2015-12-13: 5 mg via ORAL
  Administered 2015-12-13 – 2015-12-15 (×6): 15 mg via ORAL
  Administered 2015-12-15: 10 mg via ORAL
  Administered 2015-12-15 – 2015-12-16 (×6): 15 mg via ORAL
  Filled 2015-12-13: qty 3
  Filled 2015-12-13: qty 2
  Filled 2015-12-13 (×9): qty 3
  Filled 2015-12-13: qty 1
  Filled 2015-12-13 (×6): qty 3

## 2015-12-13 MED ORDER — SUGAMMADEX SODIUM 500 MG/5ML IV SOLN
INTRAVENOUS | Status: AC
  Filled 2015-12-13: qty 5

## 2015-12-13 MED ORDER — HYDROMORPHONE HCL 1 MG/ML IJ SOLN
1.0000 mg | INTRAMUSCULAR | Status: DC | PRN
  Administered 2015-12-13: 0.5 mg via INTRAVENOUS
  Filled 2015-12-13: qty 1

## 2015-12-13 MED ORDER — BUPIVACAINE LIPOSOME 1.3 % IJ SUSP
20.0000 mL | Freq: Once | INTRAMUSCULAR | Status: AC
  Administered 2015-12-13: 20 mL
  Filled 2015-12-13: qty 20

## 2015-12-13 MED ORDER — FENTANYL CITRATE (PF) 100 MCG/2ML IJ SOLN
INTRAMUSCULAR | Status: AC
  Filled 2015-12-13: qty 2

## 2015-12-13 MED ORDER — METHOCARBAMOL 1000 MG/10ML IJ SOLN
500.0000 mg | Freq: Four times a day (QID) | INTRAMUSCULAR | Status: DC | PRN
  Administered 2015-12-13: 500 mg via INTRAVENOUS
  Filled 2015-12-13: qty 5
  Filled 2015-12-13: qty 550

## 2015-12-13 MED ORDER — METOCLOPRAMIDE HCL 5 MG/ML IJ SOLN: 5.0000 mg | Freq: Three times a day (TID) | INTRAMUSCULAR | Status: DC | PRN

## 2015-12-13 MED ORDER — ACETAMINOPHEN 10 MG/ML IV SOLN
INTRAVENOUS | Status: AC
  Filled 2015-12-13: qty 100

## 2015-12-13 MED ORDER — PROPOFOL 10 MG/ML IV BOLUS
INTRAVENOUS | Status: DC | PRN
  Administered 2015-12-13: 350 mg via INTRAVENOUS

## 2015-12-13 MED ORDER — ONDANSETRON HCL 4 MG/2ML IJ SOLN
INTRAMUSCULAR | Status: AC
  Filled 2015-12-13: qty 2

## 2015-12-13 MED ORDER — OCUVITE-LUTEIN PO CAPS
1.0000 | ORAL_CAPSULE | Freq: Two times a day (BID) | ORAL | Status: DC
  Administered 2015-12-13 – 2015-12-16 (×6): 1 via ORAL
  Filled 2015-12-13 (×7): qty 1

## 2015-12-13 MED ORDER — NORTRIPTYLINE HCL 25 MG PO CAPS
50.0000 mg | ORAL_CAPSULE | Freq: Every day | ORAL | Status: DC
  Administered 2015-12-13 – 2015-12-15 (×3): 50 mg via ORAL
  Filled 2015-12-13 (×3): qty 2

## 2015-12-13 MED ORDER — ROCURONIUM BROMIDE 50 MG/5ML IV SOSY
PREFILLED_SYRINGE | INTRAVENOUS | Status: AC
  Filled 2015-12-13: qty 5

## 2015-12-13 MED ORDER — SODIUM CHLORIDE 0.9 % IJ SOLN
INTRAMUSCULAR | Status: AC
Start: 2015-12-13 — End: 2015-12-13
  Filled 2015-12-13: qty 50

## 2015-12-13 MED ORDER — SACCHAROMYCES BOULARDII 250 MG PO CAPS
250.0000 mg | ORAL_CAPSULE | Freq: Every day | ORAL | Status: DC
  Administered 2015-12-13 – 2015-12-15 (×3): 250 mg via ORAL
  Filled 2015-12-13 (×6): qty 1

## 2015-12-13 MED ORDER — SODIUM CHLORIDE 0.9 % IR SOLN
Status: DC | PRN
  Administered 2015-12-13: 2000 mL

## 2015-12-13 MED ORDER — DEXAMETHASONE SODIUM PHOSPHATE 10 MG/ML IJ SOLN
INTRAMUSCULAR | Status: AC
  Filled 2015-12-13: qty 1

## 2015-12-13 MED ORDER — SODIUM CHLORIDE 0.9 % IV SOLN
INTRAVENOUS | Status: DC
  Administered 2015-12-13: 75 mL/h via INTRAVENOUS
  Administered 2015-12-14: 01:00:00 via INTRAVENOUS

## 2015-12-13 MED ORDER — GABAPENTIN 300 MG PO CAPS
600.0000 mg | ORAL_CAPSULE | Freq: Three times a day (TID) | ORAL | Status: DC
  Administered 2015-12-13 – 2015-12-16 (×9): 600 mg via ORAL
  Filled 2015-12-13 (×9): qty 2

## 2015-12-13 MED ORDER — MIDAZOLAM HCL 5 MG/5ML IJ SOLN
INTRAMUSCULAR | Status: DC | PRN
  Administered 2015-12-13 (×2): 2 mg via INTRAVENOUS

## 2015-12-13 MED ORDER — ACETAMINOPHEN 325 MG PO TABS: 650.0000 mg | ORAL_TABLET | Freq: Four times a day (QID) | ORAL | Status: DC | PRN

## 2015-12-13 MED ORDER — PROPOFOL 500 MG/50ML IV EMUL
INTRAVENOUS | Status: DC | PRN
  Administered 2015-12-13: 100 ug/kg/min via INTRAVENOUS

## 2015-12-13 MED ORDER — TRIAMTERENE-HCTZ 75-50 MG PO TABS: 1.0000 | ORAL_TABLET | Freq: Every day | ORAL | Status: DC | PRN

## 2015-12-13 MED ORDER — FOLIC ACID 1 MG PO TABS
1.0000 mg | ORAL_TABLET | Freq: Every day | ORAL | Status: DC
  Administered 2015-12-13 – 2015-12-16 (×4): 1 mg via ORAL
  Filled 2015-12-13 (×4): qty 1

## 2015-12-13 MED ORDER — RAMIPRIL 10 MG PO CAPS
10.0000 mg | ORAL_CAPSULE | Freq: Two times a day (BID) | ORAL | Status: DC
  Administered 2015-12-13 – 2015-12-16 (×4): 10 mg via ORAL
  Filled 2015-12-13 (×6): qty 1

## 2015-12-13 MED ORDER — SUCCINYLCHOLINE CHLORIDE 200 MG/10ML IV SOSY
PREFILLED_SYRINGE | INTRAVENOUS | Status: DC | PRN
  Administered 2015-12-13: 140 mg via INTRAVENOUS

## 2015-12-13 MED ORDER — LIDOCAINE 2% (20 MG/ML) 5 ML SYRINGE
INTRAMUSCULAR | Status: DC | PRN
  Administered 2015-12-13: 100 mg via INTRAVENOUS

## 2015-12-13 MED ORDER — SODIUM CHLORIDE 0.9 % IJ SOLN
INTRAMUSCULAR | Status: DC | PRN
  Administered 2015-12-13: 20 mL

## 2015-12-13 MED ORDER — SODIUM CHLORIDE 0.9 % IV SOLN
1000.0000 mg | INTRAVENOUS | Status: DC
  Filled 2015-12-13: qty 10

## 2015-12-13 MED ORDER — CEFAZOLIN SODIUM-DEXTROSE 2-4 GM/100ML-% IV SOLN
2.0000 g | Freq: Four times a day (QID) | INTRAVENOUS | Status: AC
  Administered 2015-12-13 (×2): 2 g via INTRAVENOUS
  Filled 2015-12-13 (×2): qty 100

## 2015-12-13 MED ORDER — ONDANSETRON HCL 4 MG PO TABS: 4.0000 mg | ORAL_TABLET | Freq: Four times a day (QID) | ORAL | Status: DC | PRN

## 2015-12-13 MED ORDER — CETIRIZINE HCL 10 MG PO TABS
5.0000 mg | ORAL_TABLET | Freq: Every evening | ORAL | Status: DC
  Administered 2015-12-13 – 2015-12-15 (×3): 5 mg via ORAL
  Filled 2015-12-13 (×4): qty 1

## 2015-12-13 MED ORDER — BUPIVACAINE HCL (PF) 0.25 % IJ SOLN
INTRAMUSCULAR | Status: AC
  Filled 2015-12-13: qty 30

## 2015-12-13 MED ORDER — MIRABEGRON ER 50 MG PO TB24
50.0000 mg | ORAL_TABLET | Freq: Every day | ORAL | Status: DC
  Administered 2015-12-13 – 2015-12-16 (×4): 50 mg via ORAL
  Filled 2015-12-13 (×4): qty 1

## 2015-12-13 MED ORDER — BUPIVACAINE HCL (PF) 0.25 % IJ SOLN
INTRAMUSCULAR | Status: DC | PRN
  Administered 2015-12-13: 20 mL

## 2015-12-13 MED ORDER — ONDANSETRON HCL 4 MG/2ML IJ SOLN
INTRAMUSCULAR | Status: DC | PRN
Start: 2015-12-13 — End: 2015-12-13
  Administered 2015-12-13: 4 mg via INTRAVENOUS

## 2015-12-13 MED ORDER — KETOROLAC TROMETHAMINE 15 MG/ML IJ SOLN
7.5000 mg | Freq: Four times a day (QID) | INTRAMUSCULAR | Status: AC
  Administered 2015-12-13 – 2015-12-14 (×4): 7.5 mg via INTRAVENOUS
  Filled 2015-12-13 (×3): qty 1

## 2015-12-13 MED ORDER — OXYCODONE HCL 5 MG PO TABS: 5.0000 mg | ORAL_TABLET | Freq: Once | ORAL | Status: DC | PRN

## 2015-12-13 MED ORDER — CYCLOBENZAPRINE HCL 10 MG PO TABS: 10.0000 mg | ORAL_TABLET | Freq: Three times a day (TID) | ORAL | Status: DC | PRN

## 2015-12-13 MED ORDER — ACETAMINOPHEN 650 MG RE SUPP
650.0000 mg | Freq: Four times a day (QID) | RECTAL | Status: DC | PRN
Start: 2015-12-13 — End: 2015-12-16

## 2015-12-13 MED ORDER — PANTOPRAZOLE SODIUM 40 MG PO TBEC
40.0000 mg | DELAYED_RELEASE_TABLET | Freq: Every day | ORAL | Status: DC
  Administered 2015-12-13 – 2015-12-16 (×4): 40 mg via ORAL
  Filled 2015-12-13 (×4): qty 1

## 2015-12-13 MED ORDER — KETOROLAC TROMETHAMINE 15 MG/ML IJ SOLN
INTRAMUSCULAR | Status: AC
  Administered 2015-12-13: 7.5 mg via INTRAVENOUS
  Filled 2015-12-13: qty 1

## 2015-12-13 MED ORDER — METOCLOPRAMIDE HCL 5 MG PO TABS: 5.0000 mg | ORAL_TABLET | Freq: Three times a day (TID) | ORAL | Status: DC | PRN

## 2015-12-13 MED ORDER — ALBUTEROL SULFATE HFA 108 (90 BASE) MCG/ACT IN AERS: 2.0000 | INHALATION_SPRAY | Freq: Four times a day (QID) | RESPIRATORY_TRACT | Status: DC | PRN

## 2015-12-13 MED ORDER — PHENOL 1.4 % MT LIQD
1.0000 | OROMUCOSAL | Status: DC | PRN
  Filled 2015-12-13: qty 177

## 2015-12-13 MED ORDER — SUGAMMADEX SODIUM 500 MG/5ML IV SOLN
INTRAVENOUS | Status: DC | PRN
  Administered 2015-12-13: 500 mg via INTRAVENOUS

## 2015-12-13 MED ORDER — FENTANYL CITRATE (PF) 100 MCG/2ML IJ SOLN
INTRAMUSCULAR | Status: DC | PRN
  Administered 2015-12-13 (×3): 100 ug via INTRAVENOUS

## 2015-12-13 MED ORDER — RIVAROXABAN 10 MG PO TABS
10.0000 mg | ORAL_TABLET | Freq: Every day | ORAL | Status: DC
  Administered 2015-12-14 – 2015-12-16 (×3): 10 mg via ORAL
  Filled 2015-12-13 (×3): qty 1

## 2015-12-13 MED ORDER — ROCURONIUM BROMIDE 10 MG/ML (PF) SYRINGE: PREFILLED_SYRINGE | INTRAVENOUS | Status: DC | PRN

## 2015-12-13 MED ORDER — SUCCINYLCHOLINE CHLORIDE 200 MG/10ML IV SOSY
PREFILLED_SYRINGE | INTRAVENOUS | Status: AC
  Filled 2015-12-13: qty 10

## 2015-12-13 MED ORDER — DOCUSATE SODIUM 100 MG PO CAPS
200.0000 mg | ORAL_CAPSULE | Freq: Two times a day (BID) | ORAL | Status: DC
  Administered 2015-12-13 – 2015-12-16 (×7): 200 mg via ORAL
  Filled 2015-12-13 (×7): qty 2

## 2015-12-13 MED ORDER — LIDOCAINE 2% (20 MG/ML) 5 ML SYRINGE
INTRAMUSCULAR | Status: AC
  Filled 2015-12-13: qty 5

## 2015-12-13 MED ORDER — SUCRALFATE 1 G PO TABS: 1.0000 g | ORAL_TABLET | Freq: Two times a day (BID) | ORAL | Status: DC | PRN

## 2015-12-13 SURGICAL SUPPLY — 55 items
BAG ZIPLOCK 12X15 (MISCELLANEOUS) IMPLANT
BANDAGE ACE 6X5 VEL STRL LF (GAUZE/BANDAGES/DRESSINGS) ×3 IMPLANT
BANDAGE ELASTIC 6 VELCRO ST LF (GAUZE/BANDAGES/DRESSINGS) ×6 IMPLANT
BENZOIN TINCTURE PRP APPL 2/3 (GAUZE/BANDAGES/DRESSINGS) IMPLANT
BLADE SAG 13.0X1.37X90 (BLADE) IMPLANT
BLADE SAG 18X100X1.27 (BLADE) IMPLANT
BONE CEMENT PALACOSE (Orthopedic Implant) ×6 IMPLANT
BOWL SMART MIX CTS (DISPOSABLE) ×3 IMPLANT
CAPT KNEE TOTAL 3 ×3 IMPLANT
CEMENT BONE PALACOSE (Orthopedic Implant) ×2 IMPLANT
CEMENT BONE SIMPLEX SPEEDSET (Cement) IMPLANT
CLOSURE WOUND 1/2 X4 (GAUZE/BANDAGES/DRESSINGS)
CLOTH BEACON ORANGE TIMEOUT ST (SAFETY) ×3 IMPLANT
CUFF TOURN SGL QUICK 34 (TOURNIQUET CUFF) ×2
CUFF TRNQT CYL 34X4X40X1 (TOURNIQUET CUFF) ×1 IMPLANT
DRAPE U-SHAPE 47X51 STRL (DRAPES) ×3 IMPLANT
DRSG AQUACEL AG ADV 3.5X10 (GAUZE/BANDAGES/DRESSINGS) ×3 IMPLANT
DRSG PAD ABDOMINAL 8X10 ST (GAUZE/BANDAGES/DRESSINGS) ×3 IMPLANT
DURAPREP 26ML APPLICATOR (WOUND CARE) ×3 IMPLANT
ELECT REM PT RETURN 9FT ADLT (ELECTROSURGICAL) ×3
ELECTRODE REM PT RTRN 9FT ADLT (ELECTROSURGICAL) ×1 IMPLANT
GAUZE SPONGE 4X4 12PLY STRL (GAUZE/BANDAGES/DRESSINGS) ×3 IMPLANT
GAUZE XEROFORM 1X8 LF (GAUZE/BANDAGES/DRESSINGS) ×3 IMPLANT
GLOVE BIO SURGEON STRL SZ7.5 (GLOVE) ×3 IMPLANT
GLOVE BIOGEL PI IND STRL 8 (GLOVE) ×2 IMPLANT
GLOVE BIOGEL PI INDICATOR 8 (GLOVE) ×4
GLOVE ECLIPSE 8.0 STRL XLNG CF (GLOVE) ×3 IMPLANT
GOWN STRL REUS W/TWL XL LVL3 (GOWN DISPOSABLE) ×6 IMPLANT
HANDPIECE INTERPULSE COAX TIP (DISPOSABLE) ×2
IMMOBILIZER KNEE 20 (SOFTGOODS) ×6 IMPLANT
IMMOBILIZER KNEE 20 THIGH 36 (SOFTGOODS) ×1 IMPLANT
NS IRRIG 1000ML POUR BTL (IV SOLUTION) ×3 IMPLANT
PACK TOTAL KNEE CUSTOM (KITS) ×3 IMPLANT
PAD ABD 8X10 STRL (GAUZE/BANDAGES/DRESSINGS) ×6 IMPLANT
PADDING CAST COTTON 6X4 STRL (CAST SUPPLIES) ×6 IMPLANT
POSITIONER SURGICAL ARM (MISCELLANEOUS) ×3 IMPLANT
SET HNDPC FAN SPRY TIP SCT (DISPOSABLE) ×1 IMPLANT
SET PAD KNEE POSITIONER (MISCELLANEOUS) ×3 IMPLANT
STAPLER VISISTAT 35W (STAPLE) ×3 IMPLANT
STRIP CLOSURE SKIN 1/2X4 (GAUZE/BANDAGES/DRESSINGS) IMPLANT
SUCTION FRAZIER HANDLE 12FR (TUBING) ×2
SUCTION TUBE FRAZIER 12FR DISP (TUBING) ×1 IMPLANT
SUT MNCRL AB 4-0 PS2 18 (SUTURE) IMPLANT
SUT VIC AB 0 CT1 27 (SUTURE) ×2
SUT VIC AB 0 CT1 27XBRD ANTBC (SUTURE) ×1 IMPLANT
SUT VIC AB 1 CT1 27 (SUTURE) ×4
SUT VIC AB 1 CT1 27XBRD ANTBC (SUTURE) ×2 IMPLANT
SUT VIC AB 2-0 CT1 27 (SUTURE) ×4
SUT VIC AB 2-0 CT1 TAPERPNT 27 (SUTURE) ×2 IMPLANT
TRAY FOLEY CATH 14FRSI W/METER (CATHETERS) ×3 IMPLANT
TRAY FOLEY W/METER SILVER 16FR (SET/KITS/TRAYS/PACK) IMPLANT
WATER STERILE IRR 1000ML POUR (IV SOLUTION) ×6 IMPLANT
WATER STERILE IRR 1500ML POUR (IV SOLUTION) IMPLANT
WRAP KNEE MAXI GEL POST OP (GAUZE/BANDAGES/DRESSINGS) ×3 IMPLANT
YANKAUER SUCT BULB TIP 10FT TU (MISCELLANEOUS) ×3 IMPLANT

## 2015-12-13 NOTE — Evaluation (Signed)
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  Pt should progress to dc home with family assist and HHPT follow up.    Follow Up Recommendations Home health PT    Equipment Recommendations  None recommended by PT    Recommendations for Other Services OT consult     Precautions / Restrictions Precautions Precautions: Knee;Fall Required Braces or Orthoses: Knee Immobilizer - Right Knee Immobilizer - Right: Discontinue once straight leg raise with < 10 degree lag Restrictions Weight Bearing Restrictions: No      Mobility  Bed Mobility Overal bed mobility: Needs Assistance;+ 2 for safety/equipment Bed Mobility: Supine to Sit;Sit to Supine     Supine to sit: Min assist;+2 for physical assistance;+2 for safety/equipment Sit to supine: Min assist;+2 for physical assistance;+2 for safety/equipment   General bed mobility comments: min cues for sequence and use of L LE to self assist  Transfers Overall transfer level: Needs assistance Equipment used: Rolling walker (2 wheeled) Transfers: Sit to/from Stand Sit to Stand: Min assist;+2 physical assistance;+2 safety/equipment         General transfer comment: cues for LE management and use of UEs to self assist  Ambulation/Gait Ambulation/Gait assistance: Min assist;+2 safety/equipment Ambulation Distance (Feet): 40 Feet Assistive device: Rolling walker (2 wheeled) Gait Pattern/deviations: Step-to pattern;Decreased step length - right;Decreased step length - left;Shuffle;Trunk flexed Gait velocity: decr Gait velocity interpretation: Below normal speed for age/gender General Gait Details: cues for sequence, posture and position from  [employer]            Wheelchair Mobility    Modified Rankin (Stroke Patients Only)       Balance                                             Pertinent Vitals/Pain Pain Assessment: 0-10 Pain Score: 6  Pain Location: R knee Pain Descriptors / Indicators: Aching;Sore Pain Intervention(s): Limited activity within patient's tolerance;Monitored during session;Premedicated before session;Ice applied    Home Living Family/patient expects to be discharged to:: Private residence Living Arrangements: Spouse/significant other;Children;Other relatives Available Help at Discharge: Available 24 hours/day Type of Home: House Home Access: Ramped entrance     Home Layout: One level Home Equipment: [place] - 2 wheels;Cane - single point;Tub bench;Grab bars - tub/shower Additional Comments: Pt states she can arrange 24/7 assist as needed    Prior Function Level of Independence: Independent with assistive device(s)               Hand Dominance   Dominant Hand: Right    Extremity/Trunk Assessment   Upper Extremity Assessment: Overall WFL for tasks assessed           Lower Extremity Assessment: RLE deficits/detail RLE Deficits / Details: Quads 2+/5 with AAROM at knee -5 - 40    Cervical / Trunk Assessment: Normal  Communication   Communication: No difficulties  Cognition Arousal/Alertness: Awake/alert Behavior During Therapy: WFL for tasks assessed/performed Overall Cognitive Status: Within Functional Limits for tasks assessed  General Comments      Exercises Total Joint Exercises Ankle Circles/Pumps: AROM;Both;15 reps;Supine Quad Sets: AROM;Both;10 reps;Supine Heel Slides: AAROM;Right;10 reps;Supine Straight Leg Raises: AAROM;Right;10 reps;Supine   Assessment/Plan    PT Assessment Patient needs continued PT services  PT Problem List Decreased strength;Decreased range of motion;Decreased activity  tolerance;Decreased mobility;Decreased knowledge of use of DME;Pain;Obesity          PT Treatment Interventions DME instruction;Gait training;Stair training;Functional mobility training;Therapeutic activities;Therapeutic exercise;Patient/family education    PT Goals (Current goals can be found in the Care Plan section)  Acute Rehab PT Goals Patient Stated Goal: Regain IND PT Goal Formulation: With patient Time For Goal Achievement: 12/17/15 Potential to Achieve Goals: Good    Frequency 7X/week   Barriers to discharge        Co-evaluation               End of Session Equipment Utilized During Treatment: Gait belt;Right knee immobilizer Activity Tolerance: Patient tolerated treatment well Patient left: in bed;with call bell/phone within reach;with family/visitor present Nurse Communication: Mobility status         Time: [id number] PT Time Calculation (min) (ACUTE ONLY): 38 min   Charges:   PT Evaluation $PT Eval Low Complexity: 1 Procedure PT Treatments $Gait Training: 8-22 mins $Therapeutic Exercise: 8-22 mins   PT G Codes:        Lumi Winslett 12/14/2015, 5:33 PM

## 2015-12-13 NOTE — Brief Op Note (Signed)
12/13/2015  10:02 AM  PATIENT:  [name]  65 y.o. female  PRE-OPERATIVE DIAGNOSIS:  Severe osteoarthritis right knee  POST-OPERATIVE DIAGNOSIS:  Severe osteoarthritis right knee  PROCEDURE:  Procedure(s): RIGHT TOTAL KNEE ARTHROPLASTY (Right)  SURGEON:  Surgeon(s) and Role:    * Mcarthur Rossetti, MD - Primary  PHYSICIAN ASSISTANT: Benita Stabile, PA-C  ANESTHESIA:   local, spinal and general  EBL:  Total I/O In: 2000 [I.V.:2000] Out: 100 [Urine:100]  LOCAL MEDICATIONS USED:  MARCAINE    and OTHER Experil  COUNTS:  YES  TOURNIQUET:   Total Tourniquet Time Documented: Thigh (Right) - 70 minutes Total: Thigh (Right) - 70 minutes   DICTATION: .Other Dictation: Dictation Number [id number]  PLAN OF CARE: Admit to inpatient   PATIENT DISPOSITION:  PACU - hemodynamically stable.   Delay start of Pharmacological VTE agent (>24hrs) due to surgical blood loss or risk of bleeding: no

## 2015-12-13 NOTE — Anesthesia Preprocedure Evaluation (Signed)
Anesthesia Evaluation  Patient identified by MRN, date of birth, ID band Patient awake    Reviewed: Allergy & Precautions, NPO status , Patient's Chart, lab work & pertinent test results  History of Anesthesia Complications Negative for: history of anesthetic complications  Airway Mallampati: I  TM Distance: >3 FB Neck ROM: Full    Dental  (+) Edentulous Upper, Dental Advisory Given   Pulmonary neg shortness of breath, asthma , neg sleep apnea, neg recent URI, Current Smoker,    breath sounds clear to auscultation       Cardiovascular hypertension, Pt. on medications (-) angina(-) Past MI and (-) CHF  Rhythm:Regular     Neuro/Psych negative neurological ROS  negative psych ROS   GI/Hepatic Neg liver ROS, GERD  Medicated and Controlled,  Endo/Other  Morbid obesity  Renal/GU negative Renal ROS     Musculoskeletal  (+) Arthritis , Fibromyalgia -  Abdominal   Peds  Hematology   Anesthesia Other Findings   Reproductive/Obstetrics                             Anesthesia Physical Anesthesia Plan  ASA: III  Anesthesia Plan: Spinal   Post-op Pain Management:    Induction:   Airway Management Planned: Natural Airway, Nasal Cannula and Simple Face Mask  Additional Equipment: None  Intra-op Plan:   Post-operative Plan:   Informed Consent: I have reviewed the patients History and Physical, chart, labs and discussed the procedure including the risks, benefits and alternatives for the proposed anesthesia with the patient or authorized representative who has indicated his/her understanding and acceptance.   Dental advisory given  Plan Discussed with: CRNA and Surgeon  Anesthesia Plan Comments:         Anesthesia Quick Evaluation

## 2015-12-13 NOTE — H&P (Signed)
TOTAL KNEE ADMISSION H&P  Patient is being admitted for right total knee arthroplasty.  Subjective:  Chief Complaint:right knee pain.  HPI: Wendy Sanchez, 65 y.o. female, has a history of pain and functional disability in the right knee due to arthritis and has failed non-surgical conservative treatments for greater than 12 weeks to includeNSAID's and/or analgesics, corticosteriod injections, viscosupplementation injections, flexibility and strengthening excercises, use of assistive devices, weight reduction as appropriate and activity modification.  Onset of symptoms was gradual, starting 3 years ago with gradually worsening course since that time. The patient noted no past surgery on the right knee(s).  Patient currently rates pain in the right knee(s) at 10 out of 10 with activity. Patient has night pain, worsening of pain with activity and weight bearing, pain that interferes with activities of daily living, pain with passive range of motion, crepitus and joint swelling.  Patient has evidence of subchondral sclerosis, periarticular osteophytes and joint space narrowing by imaging studies. This patient has had. There is no active infection.  Patient Active Problem List   Diagnosis Date Noted  . Unilateral primary osteoarthritis, right knee 12/13/2015  . Osteoarthritis of left knee 08/02/2015  . 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 Spinal stenosis of lumbar region 06/19/2015  . Chronic pain syndrome 10/30/2014   Past Medical History:  Diagnosis Date  . Allergic rhinitis   . Arthritis   . Asthma    problems with fumes and aerosols cause asthma  . Complication of anesthesia    awakens during surgery; has occurred with last 3-4 surgeries   . Environmental allergies    fumes   . Fibromyalgia   . GERD (gastroesophageal reflux disease)   . History of bronchitis   . Hypertension   . Numbness    hands bilat when driving; improves when not preforming task   . Status post  gastric banding   . Tinnitus    comes and goes   . Vertigo    6 months ago approx     Past Surgical History:  Procedure Laterality Date  . ABDOMINAL HYSTERECTOMY  1979   left ovary remains  . ABDOMINAL HYSTERECTOMY    . CHOLECYSTECTOMY    . fibrous tissue removed from right shoulder and back of neck      2 years ago   . GANGLION CYST EXCISION    . GASTRIC BYPASS  1980   states had bypass with banding and band left in -  . KNEE SURGERY Bilateral    both knees  2001  . TONSILLECTOMY     age 23  . TOTAL KNEE ARTHROPLASTY Left 08/02/2015   Procedure: LEFT TOTAL KNEE ARTHROPLASTY;  Surgeon: Mcarthur Rossetti, MD;  Location: [facility];  Service: Orthopedics;  Laterality: Left;    Prescriptions Prior to Admission  Medication Sig Dispense Refill Last Dose  . azelastine (ASTELIN) 0.1 % nasal spray Place 1 spray into both nostrils 2 (two) times daily as needed for allergies. Use in each nostril as directed   12/13/2015 at 0430  . dexlansoprazole (DEXILANT) 60 MG capsule Take 60 mg by mouth daily.   12/12/2015 at Unknown time  . diclofenac sodium (VOLTAREN) 1 % GEL Apply 2 g topically 4 (four) times daily.    Past Week at Unknown time  . docusate sodium (COLACE) 100 MG capsule Take 200 mg by mouth 2 (two) times daily.   12/12/2015 at Unknown time  . Fluticasone-Salmeterol (ADVAIR) 250-50 MCG/DOSE AEPB Inhale 1 puff into  the lungs 2 (two) times daily.   12/13/2015 at 0430  . folic acid (FOLVITE) 1 MG tablet Take 1 mg by mouth daily.   12/12/2015 at Unknown time  . furosemide (LASIX) 40 MG tablet Take 40 mg by mouth daily as needed (high blood pressure).    12/12/2015 at Unknown time  . gabapentin (NEURONTIN) 300 MG capsule Take 2 capsules (600 mg total) by mouth 3 (three) times daily. 180 capsule 1 12/13/2015 at 0430  . ibuprofen (ADVIL,MOTRIN) 800 MG tablet Take 1 tablet (800 mg total) by mouth 3 (three) times daily. (Patient taking differently: Take 800 mg by mouth 4 (four) times daily as  needed for moderate pain. ) 90 tablet 5 12/12/2015 at Unknown time  . levocetirizine (XYZAL) 5 MG tablet Take 5 mg by mouth every evening.   12/12/2015 at Unknown time  . mirabegron ER (MYRBETRIQ) 50 MG TB24 tablet Take 50 mg by mouth daily.   12/12/2015 at Unknown time  . montelukast (SINGULAIR) 10 MG tablet Take 10 mg by mouth at bedtime.   12/12/2015 at Unknown time  . Multiple Vitamins-Minerals (ICAPS AREDS 2) CAPS Take 1 capsule by mouth 2 (two) times daily.    12/12/2015 at Unknown time  . Multiple Vitamins-Minerals (MULTIVITAMIN GUMMIES ADULTS) CHEW Chew 2 tablets by mouth daily.    12/12/2015 at Unknown time  . nortriptyline (PAMELOR) 50 MG capsule Take 50 mg by mouth at bedtime.   12/12/2015 at Unknown time  . omega-3 acid ethyl esters (LOVAZA) 1 g capsule Take 1 g by mouth 2 (two) times a week. Monday and thursdays   Past Month at Unknown time  . oxyCODONE-acetaminophen (PERCOCET) 10-325 MG tablet Take 1 tablet by mouth every 4 (four) hours as needed for pain. (Patient taking differently: Take 1 tablet by mouth 3 (three) times daily. ) 90 tablet 0 12/12/2015 at 2100  . potassium chloride SA (K-DUR,KLOR-CON) 20 MEQ tablet Take 20 mEq by mouth 3 (three) times a week. Monday, Wednesday, and Saturday   12/12/2015 at Unknown time  . Probiotic Product (PROBIOTIC COLON SUPPORT) CAPS Take 1 capsule by mouth at bedtime.   12/12/2015 at Unknown time  . ramipril (ALTACE) 10 MG capsule Take 10 mg by mouth 2 (two) times daily.   12/12/2015 at pm  . Vitamin D, Ergocalciferol, (DRISDOL) 50000 UNITS CAPS capsule Take 50,000 Units by mouth every 7 (seven) days. thursdays   12/12/2015 at Unknown time  . albuterol (PROVENTIL HFA;VENTOLIN HFA) 108 (90 BASE) MCG/ACT inhaler Inhale 2 puffs into the lungs every 6 (six) hours as needed for wheezing or shortness of breath.   More than a month at Unknown time  . aspirin EC 325 MG EC tablet Take 1 tablet (325 mg total) by mouth 2 (two) times daily after a meal.  (Patient not taking: Reported on 11/28/2015) 30 tablet 0 Not Taking at Unknown time  . clotrimazole (LOTRIMIN) 1 % cream Apply 1 application topically 2 (two) times daily as needed (rash).    More than a month at Unknown time  . cyclobenzaprine (FLEXERIL) 10 MG tablet Take 1 tablet (10 mg total) by mouth 3 (three) times daily as needed for muscle spasms. 60 tablet 0 More than a month at Unknown time  . HYDROcodone-acetaminophen (NORCO) 5-325 MG tablet Take 1-2 tablets by mouth every 6 (six) hours as needed for moderate pain. One to two tabs every 4-6 hours for pain (Patient not taking: Reported on 11/28/2015) 60 tablet 0 Completed Course at Unknown time  .  sucralfate (CARAFATE) 1 G tablet Take 1 g by mouth 2 (two) times daily as needed (esophageal burning).    More than a month at Unknown time  . traMADol (ULTRAM) 50 MG tablet Take 2 tablets (100 mg total) by mouth 3 (three) times daily between meals. (Patient not taking: Reported on 11/28/2015) 90 tablet 3 Not Taking at Unknown time  . triamterene-hydrochlorothiazide (MAXZIDE) 75-50 MG tablet Take 1 tablet by mouth daily as needed (edema).    More than a month at Unknown time   Allergies  Allergen Reactions  . Iodine Anaphylaxis and Swelling  . Other     ALLERGY TO METAL - BLACKENS SKIN AND CAUSES A RASH   . Tape Swelling    Skin comes off    Social History  Substance Use Topics  . Smoking status: Current Every Day Smoker    Packs/day: 0.25    Years: 50.00    Types: Cigarettes  . Smokeless tobacco: Former Systems developer    Types: Snuff, Sarina Ser    Quit date: 01/13/1995     Comment: pt is determined to quit, she has cut down her cigarettes by 2/3 what she was smoking  . Alcohol use No    Family History  Problem Relation Age of Onset  . Hypertension Father   . Deep vein thrombosis Father   . Dementia Mother   . Diabetes Sister   . Congestive Heart Failure Sister   . Congestive Heart Failure Brother   . Congestive Heart Failure Daughter   .  Congestive Heart Failure Son   . Breast cancer Maternal Aunt     great aunt     Review of Systems  Musculoskeletal: Positive for joint pain.  All other systems reviewed and are negative.   Objective:  Physical Exam  Constitutional: She is oriented to person, place, and time. She appears well-developed and well-nourished.  HENT:  Head: Normocephalic and atraumatic.  Eyes: EOM are normal. Pupils are equal, round, and reactive to light.  Neck: Normal range of motion. Neck supple.  Cardiovascular: Normal rate.   Respiratory: Effort normal.  Musculoskeletal:       Right knee: She exhibits decreased range of motion, swelling, effusion and abnormal alignment. Tenderness found. Medial joint line and lateral joint line tenderness noted.  Neurological: She is alert and oriented to person, place, and time.  Skin: Skin is warm and dry.  Psychiatric: She has a normal mood and affect.    Vital signs in last 24 hours: Temp:  [98.2 F (36.8 C)] 98.2 F (36.8 C) (12/01 0604) Pulse Rate:  [94] 94 (12/01 0604) Resp:  [18] 18 (12/01 0604) BP: (153)/(99) 153/99 (12/01 0604) SpO2:  [99 %] 99 % (12/01 0604) Weight:  [320 lb (145.2 kg)] 320 lb (145.2 kg) (12/01 0620)  Labs:   Estimated body mass index is 48.66 kg/m as calculated from the following:   Height as of this encounter: 5\' 8"  (1.727 m).   Weight as of this encounter: 320 lb (145.2 kg).   Imaging Review Plain radiographs demonstrate severe degenerative joint disease of the right knee(s). The overall alignment ismild valgus. The bone quality appears to be good for age and reported activity level.  Assessment/Plan:  End stage arthritis, right knee   The patient history, physical examination, clinical judgment of the provider and imaging studies are consistent with end stage degenerative joint disease of the right knee(s) and total knee arthroplasty is deemed medically necessary. The treatment options including medical management,  injection  therapy arthroscopy and arthroplasty were discussed at length. The risks and benefits of total knee arthroplasty were presented and reviewed. The risks due to aseptic loosening, infection, stiffness, patella tracking problems, thromboembolic complications and other imponderables were discussed. The patient acknowledged the explanation, agreed to proceed with the plan and consent was signed. Patient is being admitted for inpatient treatment for surgery, pain control, PT, OT, prophylactic antibiotics, VTE prophylaxis, progressive ambulation and ADL's and discharge planning. The patient is planning to be discharged home with home health services

## 2015-12-13 NOTE — Transfer of Care (Signed)
Immediate Anesthesia Transfer of Care Note  Patient: Wendy Sanchez  Procedure(s) Performed: Procedure(s): RIGHT TOTAL KNEE ARTHROPLASTY (Right)  Patient Location: PACU  Anesthesia Type:General  Level of Consciousness: sedated  Airway & Oxygen Therapy: Patient Spontanous Breathing and Patient connected to face mask oxygen  Post-op Assessment: Report given to RN and Post -op Vital signs reviewed and stable  Post vital signs: Reviewed and stable  Last Vitals:  Vitals:   12/13/15 0604  BP: (!) 153/99  Pulse: 94  Resp: 18  Temp: 36.8 C    Last Pain:  Vitals:   12/13/15 0620  TempSrc:   PainSc: 0-No pain      Patients Stated Pain Goal: 4 ([id number])  Complications: No apparent anesthesia complications

## 2015-12-13 NOTE — Anesthesia Procedure Notes (Signed)
Procedure Name: Intubation Date/Time: 12/13/2015 8:10 AM Performed by: Lind Covert Pre-anesthesia Checklist: Patient identified, Emergency Drugs available, Suction available, Patient being monitored and Timeout performed Patient Re-evaluated:Patient Re-evaluated prior to inductionOxygen Delivery Method: Circle system utilized Preoxygenation: Pre-oxygenation with 100% oxygen Intubation Type: IV induction Laryngoscope Size: Mac and 4 Grade View: Grade I Tube type: Oral Tube size: 7.0 mm Number of attempts: 1 Airway Equipment and Method: Stylet Placement Confirmation: ETT inserted through vocal cords under direct vision,  positive ETCO2 and breath sounds checked- equal and bilateral Secured at: 22 cm Tube secured with: Tape Dental Injury: Teeth and Oropharynx as per pre-operative assessment

## 2015-12-13 NOTE — Anesthesia Postprocedure Evaluation (Signed)
Anesthesia Post Note  Patient: Wendy Sanchez  Procedure(s) Performed: Procedure(s) (LRB): RIGHT TOTAL KNEE ARTHROPLASTY (Right)  Patient location during evaluation: PACU Anesthesia Type: General Level of consciousness: awake Pain management: pain level controlled Vital Signs Assessment: post-procedure vital signs reviewed and stable Respiratory status: spontaneous breathing Cardiovascular status: stable Postop Assessment: no signs of nausea or vomiting Anesthetic complications: no    Last Vitals:  Vitals:   12/13/15 1542 12/13/15 1833  BP: 134/76 131/79  Pulse: 92 92  Resp: 20 20  Temp: 36.9 C 36.9 C    Last Pain:  Vitals:   12/13/15 1833  TempSrc: Oral  PainSc:                  Lyzbeth Genrich

## 2015-12-13 NOTE — Anesthesia Procedure Notes (Signed)
Spinal  Patient location during procedure: OR Start time: 12/13/2015 7:44 AM End time: 12/13/2015 8:10 AM Staffing Anesthesiologist: Oleta Mouse Resident/CRNA: Harle Stanford R Performed: anesthesiologist and resident/CRNA  Preanesthetic Checklist Completed: patient identified, site marked, surgical consent, pre-op evaluation, timeout performed, IV checked, risks and benefits discussed and monitors and equipment checked Spinal Block Patient position: sitting Prep: ChloraPrep Patient monitoring: heart rate, cardiac monitor, continuous pulse ox and blood pressure Additional Notes Attempted spinal x3 with Pencan 24G and sprotte 24 G at levels L3-4 and L2-3, Attempted paramedian X1  Dr. Ermalene Postin with multiple attempts with  Pencan 24 G  And Sprotte 24G also attempted with epidural needle. Unsuccessful General anesthesia chosen

## 2015-12-14 LAB — BASIC METABOLIC PANEL
ANION GAP: 6 (ref 5–15)
BUN: 15 mg/dL (ref 6–20)
CALCIUM: 8.1 mg/dL — AB (ref 8.9–10.3)
CHLORIDE: 106 mmol/L (ref 101–111)
CO2: 27 mmol/L (ref 22–32)
CREATININE: 0.65 mg/dL (ref 0.44–1.00)
GFR calc non Af Amer: >60 mL/min (ref >60)
Glucose, Bld: 108 mg/dL — ABNORMAL HIGH (ref 65–99)
Potassium: 3.9 mmol/L (ref 3.5–5.1)
SODIUM: 139 mmol/L (ref 135–145)

## 2015-12-14 LAB — CBC
HEMATOCRIT: 28.6 % — AB (ref 36.0–46.0)
HEMOGLOBIN: 9.1 g/dL — AB (ref 12.0–15.0)
MCH: 25.9 pg — ABNORMAL LOW (ref 26.0–34.0)
MCHC: 31.8 g/dL (ref 30.0–36.0)
MCV: 81.3 fL (ref 78.0–100.0)
Platelets: 269 10*3/uL (ref 150–400)
RBC: 3.52 MIL/uL — ABNORMAL LOW (ref 3.87–5.11)
RDW: 17.9 % — AB (ref 11.5–15.5)
WBC: 5.6 10*3/uL (ref 4.0–10.5)

## 2015-12-14 NOTE — Discharge Instructions (Addendum)
Information on my medicine - XARELTO (Rivaroxaban)  This medication education was reviewed with me or my healthcare representative as part of my discharge preparation.  The pharmacist that spoke with me during my hospital stay was:  Henreitta Leber, [facility]  Why was Xarelto prescribed for you? Xarelto was prescribed for you to reduce the risk of blood clots forming after orthopedic surgery. The medical term for these abnormal blood clots is venous thromboembolism (VTE).  What do you need to know about xarelto ? Take your Xarelto ONCE DAILY at the same time every day. You may take it either with or without food.  If you have difficulty swallowing the tablet whole, you may crush it and mix in applesauce just prior to taking your dose.   INSTRUCTIONS AFTER JOINT REPLACEMENT   o Remove items at home which could result in a fall. This includes throw rugs or furniture in walking pathways o ICE to the affected joint every three hours while awake for 30 minutes at a time, for at least the first 3-5 days, and then as needed for pain and swelling.  Continue to use ice for pain and swelling. You may notice swelling that will progress down to the foot and ankle.  This is normal after surgery.  Elevate your leg when you are not up walking on it.   o Continue to use the breathing machine you got in the hospital (incentive spirometer) which will help keep your temperature down.  It is common for your temperature to cycle up and down following surgery, especially at night when you are not up moving around and exerting yourself.  The breathing machine keeps your lungs expanded and your temperature down.   DIET:  As you were doing prior to hospitalization, we recommend a well-balanced diet.  DRESSING / WOUND CARE / SHOWERING  Keep the surgical dressing until follow up.  The dressing is water proof, so you can shower without any extra covering.  IF THE DRESSING FALLS OFF or the wound gets wet inside, change the  dressing with sterile gauze.  Please use good hand washing techniques before changing the dressing.  Do not use any lotions or creams on the incision until instructed by your surgeon.    ACTIVITY  o Increase activity slowly as tolerated, but follow the weight bearing instructions below.   o No driving for 6 weeks or until further direction given by your physician.  You cannot drive while taking narcotics.  o No lifting or carrying greater than 10 lbs. until further directed by your surgeon. o Avoid periods of inactivity such as sitting longer than an hour when not asleep. This helps prevent blood clots.  o You may return to work once you are authorized by your doctor.     WEIGHT BEARING   Weight bearing as tolerated with assist device (walker, cane, etc) as directed, use it as long as suggested by your surgeon or therapist, typically at least 4-6 weeks.   EXERCISES  Results after joint replacement surgery are often greatly improved when you follow the exercise, range of motion and muscle strengthening exercises prescribed by your doctor. Safety measures are also important to protect the joint from further injury. Any time any of these exercises cause you to have increased pain or swelling, decrease what you are doing until you are comfortable again and then slowly increase them. If you have problems or questions, call your caregiver or physical therapist for advice.   Rehabilitation is important following a  a joint replacement. After just a few days of immobilization, the muscles of the leg can become weakened and shrink (atrophy).  These exercises are designed to build up the tone and strength of the thigh and leg muscles and to improve motion. Often times heat used for twenty to thirty minutes before working out will loosen up your tissues and help with improving the range of motion but do not use heat for the first two weeks following surgery (sometimes heat can increase post-operative  swelling).  ° °These exercises can be done on a training (exercise) mat, on the floor, on a table or on a bed. Use whatever works the best and is most comfortable for you.    Use music or television while you are exercising so that the exercises are a pleasant break in your day. This will make your life better with the exercises acting as a break in your routine that you can look forward to.   Perform all exercises about fifteen times, three times per day or as directed.  You should exercise both the operative leg and the other leg as well. ° °Exercises include: °  °• Quad Sets - Tighten up the muscle on the front of the thigh (Quad) and hold for 5-10 seconds.   °• Straight Leg Raises - With your knee straight (if you were given a brace, keep it on), lift the leg to 60 degrees, hold for 3 seconds, and slowly lower the leg.  Perform this exercise against resistance later as your leg gets stronger.  °• Leg Slides: Lying on your back, slowly slide your foot toward your buttocks, bending your knee up off the floor (only go as far as is comfortable). Then slowly slide your foot back down until your leg is flat on the floor again.  °• Angel Wings: Lying on your back spread your legs to the side as far apart as you can without causing discomfort.  °• Hamstring Strength:  Lying on your back, push your heel against the floor with your leg straight by tightening up the muscles of your buttocks.  Repeat, but this time bend your knee to a comfortable angle, and push your heel against the floor.  You may put a pillow under the heel to make it more comfortable if necessary.  ° °A rehabilitation program following joint replacement surgery can speed recovery and prevent re-injury in the future due to weakened muscles. Contact your doctor or a physical therapist for more information on knee rehabilitation.  ° ° °CONSTIPATION ° °Constipation is defined medically as fewer than three stools per week and severe constipation as less than  one stool per week.  Even if you have a regular bowel pattern at home, your normal regimen is likely to be disrupted due to multiple reasons following surgery.  Combination of anesthesia, postoperative narcotics, change in appetite and fluid intake all can affect your bowels.  ° °YOU MUST use at least one of the following options; they are listed in order of increasing strength to get the job done.  They are all available over the counter, and you may need to use some, POSSIBLY even all of these options:   ° °Drink plenty of fluids (prune juice may be helpful) and high fiber foods °Colace 100 mg by mouth twice a day  °Senokot for constipation as directed and as needed Dulcolax (bisacodyl), take with full glass of water  °Miralax (polyethylene glycol) once or twice a day as needed. ° °If you   tried all these things and are unable to have a bowel movement in the first 3-4 days after surgery call either your surgeon or your primary doctor.    If you experience loose stools or diarrhea, hold the medications until you stool forms back up.  If your symptoms do not get better within 1 week or if they get worse, check with your doctor.  If you experience "the worst abdominal pain ever" or develop nausea or vomiting, please contact the office immediately for further recommendations for treatment.   ITCHING:  If you experience itching with your medications, try taking only a single pain pill, or even half a pain pill at a time.  You can also use Benadryl over the counter for itching or also to help with sleep.   TED HOSE STOCKINGS:  Use stockings on both legs until for at least 2 weeks or as directed by physician office. They may be removed at night for sleeping.  MEDICATIONS:  See your medication summary on the After Visit Summary that nursing will review with you.  You may have some home medications which will be placed on hold until you complete the course of blood thinner medication.  It is important for  you to complete the blood thinner medication as prescribed.  PRECAUTIONS:  If you experience chest pain or shortness of breath - call 911 immediately for transfer to the hospital emergency department.   If you develop a fever greater that 101 F, purulent drainage from wound, increased redness or drainage from wound, foul odor from the wound/dressing, or calf pain - CONTACT YOUR SURGEON.                                                   FOLLOW-UP APPOINTMENTS:  If you do not already have a post-op appointment, please call the office for an appointment to be seen by your surgeon.  Guidelines for how soon to be seen are listed in your After Visit Summary, but are typically between 1-4 weeks after surgery.  OTHER INSTRUCTIONS:   Knee Replacement:  Do not place pillow under knee, focus on keeping the knee straight while resting. CPM instructions: 0-90 degrees, 2 hours in the morning, 2 hours in the afternoon, and 2 hours in the evening. Place foam block, curve side up under heel at all times except when in CPM or when walking.  DO NOT modify, tear, cut, or change the foam block in any way.  MAKE SURE YOU:   Understand these instructions.   Get help right away if you are not doing well or get worse.    Thank you for letting us be a part of your medical care team.  It is a privilege we respect greatly.  We hope these instructions will help you stay on track for a fast and full recovery!   Take Xarelto exactly as prescribed by your doctor and DO NOT stop taking Xarelto without talking to the doctor who prescribed the medication.  Stopping without other VTE prevention medication to take the place of Xarelto may increase your risk of developing a clot.  After discharge, you should have regular check-up appointments with your healthcare provider that is prescribing your Xarelto.    What do you do if you miss a dose? If you miss a dose, take it as soon as you  remember on the same day then  continue your regularly scheduled once daily regimen the next day. Do not take two doses of Xarelto® on the same day.  ° °Important Safety Information °A possible side effect of Xarelto® is bleeding. You should call your healthcare provider right away if you experience any of the following: °? Bleeding from an injury or your nose that does not stop. °? Unusual colored urine (red or dark brown) or unusual colored stools (red or black). °? Unusual bruising for unknown reasons. °? A serious fall or if you hit your head (even if there is no bleeding). ° °Some medicines may interact with Xarelto® and might increase your risk of bleeding while on Xarelto®. To help avoid this, consult your healthcare provider or pharmacist prior to using any new prescription or non-prescription medications, including herbals, vitamins, non-steroidal anti-inflammatory drugs (NSAIDs) and supplements. ° °This website has more information on Xarelto®: www.xarelto.com. ° ° ° ° °

## 2015-12-14 NOTE — Care Management Note (Addendum)
Case Management Note  Patient Details  Name: Wendy Sanchez MRN: [id number] Date of Birth: Sep 21, 1950  Subjective/Objective:  Right TKA                  Action/Plan: Discharge Planning:  NCM spoke to pt and offered choice for HH/list provided. Pt agreeable to [facility]. States she had [facility] in the past. Has RW and 3n1 bedside commode at home. Her son, [name] and sister will be at home to assist with care.   Message sent to [employer]. Pt states she has application completed to assist with hospital bill.   PCP-  [name] MD  Expected Discharge Date:                Expected Discharge Plan:  [place]  In-House Referral:  NA  Discharge planning Services  CM Consult  Post Acute Care Choice:  Home Health Choice offered to:  Patient  DME Arranged:  N/A DME Agency:  NA  HH Arranged:  PT [place] Agency:  [facility] (formerly [facility])  Status of Service:  Completed, signed off  If discussed at [employer] of Stay Meetings, dates discussed:    Additional Comments:  Erenest Rasher, RN 12/14/2015, 10:59 AM

## 2015-12-14 NOTE — Op Note (Signed)
NAMEBRITINI, Wendy NO.:  [id number]  MEDICAL RECORD NO.:  [id number]  LOCATION:  [facility]                         FACILITY:  [facility]  PHYSICIAN:  Lind Guest. Ninfa Linden, M.D.DATE OF BIRTH:  10-Nov-1950  DATE OF PROCEDURE:  12/13/2015 DATE OF DISCHARGE:                              OPERATIVE REPORT   PREOPERATIVE DIAGNOSES: 1. Primary osteoarthritis and degenerative joint disease, right knee. 2. Morbid obesity with a body mass index greater than 40.  POSTOPERATIVE DIAGNOSES: 1. Primary osteoarthritis and degenerative joint disease, right knee. 2. Morbid obesity with a body mass index greater than 40.  PROCEDURE:  Right total knee arthroplasty.  IMPLANTS:  [employer] knee with size 5 femur, size 5 tibial tray, 15 mm constrained fixed-bearing polyethylene insert, size 32 patellar button.  SURGEON:  Lind Guest. Ninfa Linden, M.D.  ASSISTANT:  Erskine Emery, PA-C.  ANESTHESIA: 1. Attempted spinal. 2. General. 3. Local with mixture of 20 mL of Exparel, 20 mL of Marcaine, and 20     mL of saline.  ANTIBIOTICS:  2 g of IV Ancef.  TOURNIQUET TIME:  Under an hour and a half.  COMPLICATIONS:  None.  BLOOD LOSS:  Less than 200 mL.  INDICATIONS:  Ms. Wendy Sanchez is a 65 year old female with morbid obesity who is well known to me.  She has bilateral knee severe osteoarthritis and had bilateral arthroscopic interventions by another orthopedic surgeon in town.  Her pain became unrelenting, it was detrimentally affecting her activities of daily living, her quality of life, and her mobility. She was sent to me to consider knee replacement surgery.  In July of this year, we were able to perform a successful right total knee arthroplasty and she has done well with that.  She is already working on weight loss, but at this point, does wish to proceed with a right total knee arthroplasty.  Her pain is daily and it has detrimentally affected her activities of  daily living, her quality of life, and her mobility. She understands surgery has significant risk given her size, given the increased risk of acute blood loss anemia, nerve and vessel injury, fracture, infection, and DVT.  She understands our goals are decreased pain, improved mobility, and overall improved quality of life.  DESCRIPTION OF PROCEDURE:  After informed consent was obtained, appropriate right knee was marked.  She was brought to the operating room where spinal anesthesia was attempted while she was on the OR table.  They could not successfully get a spinal, so she was laid in the supine position, general anesthesia was then obtained and then a Foley catheter was placed as well.  A nonsterile tourniquet was placed around her upper right thigh and the right leg was prepped and draped with ChloraPrep and sterile drapes.  Time-out was called and she was identified as correct patient and correct right knee.  We then used an Esmarch to wrap out the leg and tourniquet was inflated to 350 mm of pressure.  We then made a midline incision over the patella and carried this proximally and distally.  We dissected down the knee joint and carried out a medial parapatellar arthrotomy finding a large joint effusion, significant  osteoarthritis throughout the knee.  We removed periarticular osteophytes as well as remnants of ACL, PCL, medial, and lateral meniscus.  We then used an extramedullary cutting guide for the tibia with the knee in a flexed position, took our cut to take 2 mm off the high side.  We made this cut, correcting her varus and valgus and neutral slope.  It was made without difficulty.  We then went to the femur and put our distal femoral cutting guide, based off the epicondylar axis and 5 degrees externally rotated the right knee to take 9 mm distal femoral cut.  We made this cut without difficulty.  We brought the knee back down to full extension, it was easy to get her  to full extension with a 13 mm extension block.  We then removed all the pins and went back to the femur.  We used our femoral sizing guide based off the epicondylar axis to choose a size 5 femur.  We put our 4-in-1 cutting block, off the size 5 femur, and made our anterior and posterior cuts followed by our chamfer cuts.  Then, we made our femoral box cut. We then went back to the tibia and trialed for a size 5 tibia.  We made our keel cut off this.  With the size 5 tibia and a size 5 femur, I went with a 13 mm polyethylene insert.  We then went up to a 15 mm, I placed more stability with that one.  We then made our patellar cut and drilled 3 holes for patella button.  Removed all trial instrumentation and irrigated the knee with normal saline solution using pulsatile lavage. We placed a mixture of Marcaine, Exparel, and saline into the arthrotomy.  We then mixed our cement and cemented the real [employer] tibial tray, size 5 with the real size 5 femur.  We placed the real 15 mm fix bearing constrained polyethylene insert and cemented our patellar button.  We then removed cement debris from the knee.  Once the cement had hardened, we let the tourniquet down, hemostasis was obtained with electrocautery.  We once again irrigated the knee with normal saline solution and closed our arthrotomy with interrupted #1 Vicryl suture followed by 0-Vicryl in the deep tissue, 2-0 Vicryl in the subcutaneous tissue, interrupted staples on the skin.  Well-padded sterile dressing was applied.  She was awakened, extubated, and taken to the recovery room in a stable condition.  All final counts were correct. There were no complications noted.  Of note, Erskine Emery, PA-C assisted the entire case.  His assistance was crucial for facilitating all aspects of this case.     Lind Guest. Ninfa Linden, M.D.     CYB/MEDQ  D:  12/13/2015  T:  12/14/2015  Job:  [id number]

## 2015-12-14 NOTE — Progress Notes (Signed)
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    Follow Up Recommendations  Home health PT     Equipment Recommendations  None recommended by PT    Recommendations for Other Services OT consult     Precautions / Restrictions Precautions Precautions: Knee;Fall Precaution Comments: instructed on KI use for amb and stairs Required Braces or Orthoses: Knee Immobilizer - Right Knee Immobilizer - Right: Discontinue once straight leg raise with < 10 degree lag Restrictions Weight Bearing Restrictions: No Other Position/Activity Restrictions: WBAT       Balance                                    Cognition Arousal/Alertness: Awake/alert Behavior During Therapy: WFL for tasks assessed/performed Overall Cognitive Status: Within Functional Limits for tasks assessed                      Exercises   Total Knee Replacement TE's 10 reps B LE ankle pumps 10 reps towel squeezes 10 reps knee presses 10 reps heel slides  10 reps SLR's 10 reps ABD Followed by ICE     General Comments        Pertinent Vitals/Pain Pain Assessment: 0-10 Pain Score: 5  Pain Location: R knee Pain Descriptors / Indicators: Aching;Grimacing;Tender Pain Intervention(s): Monitored during session;Repositioned;Ice applied    Home Living                      Prior Function            PT Goals (current goals can now be found in the care plan section) Progress towards PT goals: Progressing toward goals    Frequency    7X/week      PT Plan Current plan remains appropriate    Co-evaluation             End of Session Equipment Utilized During Treatment: Gait belt;Right knee immobilizer Activity  Tolerance: Patient tolerated treatment well Patient left: in chair;with call bell/phone within reach     Time: 1405-1420 PT Time Calculation (min) (ACUTE ONLY): 15 min  Charges:   $Therapeutic Exercise: 8-22 mins                     G Codes:      Wendy Sanchez  PTA [facility]  Acute  Rehab Pager      [phone]

## 2015-12-14 NOTE — Evaluation (Signed)
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 Pt will benefit from continued OT services to progress safety and independence with self care tasks for d/c home.    Follow Up Recommendations  No OT follow up;Supervision/Assistance - 24 hour    Equipment Recommendations  None recommended by OT    Recommendations for Other Services       Precautions / Restrictions Precautions Precautions: Knee;Fall Precaution Comments: instructed on KI use for amb and stairs Required Braces or Orthoses: Knee Immobilizer - Right Knee Immobilizer - Right: Discontinue once straight leg raise with < 10 degree lag Restrictions Weight Bearing Restrictions: No Other Position/Activity Restrictions: WBAT      Mobility Bed Mobility Overal bed mobility: Needs Assistance Bed Mobility: Supine to Sit     Supine to sit: Mod assist;HOB elevated     General bed mobility comments: cues for technique. assist for R LE over to EOB. increased time.  Transfers Overall transfer level: Needs assistance Equipment used: Rolling walker (2 wheeled) Transfers: Sit to/from Stand Sit to Stand: Min assist         General transfer comment: from [facility]. cues for hand placement and pt uses momentum to achieve sit to stand.    Balance                                            ADL Overall ADL's : Needs assistance/impaired Eating/Feeding: Independent;Sitting   Grooming: Set up;Sitting   Upper Body Bathing: Set up;Sitting   Lower Body Bathing: Sit to/from stand;Moderate assistance   Upper Body Dressing : Set up;Sitting   Lower Body Dressing: Maximal assistance;Sit to/from stand   Toilet Transfer:  Minimal assistance;Ambulation;BSC;RW   Toileting- Clothing Manipulation and Hygiene: Minimal assistance;Sit to/from stand         General ADL Comments: Pt interested in AE options so discussed all pieces and how to use. Pt interested in [place]. Pt educated on how to properly apply KI and use. Pt has a tub transfer bench at home. She uses rock back and forth motion to achieve sit to stand transitions. She does well in standing to perform own hygiene with min assist for balance support.     Vision     Perception     Praxis      Pertinent Vitals/Pain Pain Assessment: 0-10 Pain Score: 10-Worst pain ever Pain Location: R knee Pain Descriptors / Indicators: Aching;Grimacing Pain Intervention(s): Monitored during session     Hand Dominance Right   Extremity/Trunk Assessment Upper Extremity Assessment Upper Extremity Assessment: Overall WFL for tasks assessed           Communication Communication Communication: No difficulties   Cognition Arousal/Alertness: Awake/alert Behavior During Therapy: WFL for tasks assessed/performed Overall Cognitive Status: Within Functional Limits for tasks assessed                     General Comments       Exercises       Shoulder Instructions      Home Living Family/patient expects to be discharged to::  Private residence Living Arrangements: Spouse/significant other;Children;Other relatives Available Help at Discharge: Available 24 hours/day Type of Home: House Home Access: Ramped entrance     Home Layout: One level     Bathroom Shower/Tub: Teacher, early years/pre: Handicapped height     Home Equipment: Environmental consultant - 2 wheels;Cane - single point;Tub bench;Grab bars - tub/shower;Bedside commode   Additional Comments: Pt states she can arrange 24/7 assist as needed      Prior Functioning/Environment Level of Independence: Independent with assistive device(s)    ADL's / Homemaking Assistance  Needed: pt struggles with LB dressing            OT Problem List: Decreased strength;Decreased knowledge of use of DME or AE   OT Treatment/Interventions: Self-care/ADL training;Patient/family education;DME and/or AE instruction    OT Goals(Current goals can be found in the care plan section) Acute Rehab OT Goals Patient Stated Goal: return to independence. OT Goal Formulation: With patient Time For Goal Achievement: 12/21/15 Potential to Achieve Goals: Good  OT Frequency: Min 2X/week   Barriers to D/C:            Co-evaluation              End of Session Equipment Utilized During Treatment: Rolling walker;Right knee immobilizer  Activity Tolerance: Patient limited by pain Patient left: in bed;with call bell/phone within reach;with family/visitor present   Time: 1425-1455 OT Time Calculation (min): 30 min Charges:  OT General Charges $OT Visit: 1 Procedure OT Evaluation $OT Eval Low Complexity: 1 Procedure OT Treatments $Therapeutic Activity: 8-22 mins G-Codes:    Jules Schick 12/14/2015, 3:15 PM

## 2015-12-14 NOTE — Progress Notes (Signed)
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  Assisted OOB to amb an increased distance.  Positioned in recliner and performed some TKR TE's.     Follow Up Recommendations  Home health PT     Equipment Recommendations  None recommended by PT    Recommendations for Other Services OT consult     Precautions / Restrictions Precautions Precautions: Knee;Fall Precaution Comments: instructed on KI use for amb and stairs Required Braces or Orthoses: Knee Immobilizer - Right Knee Immobilizer - Right: Discontinue once straight leg raise with < 10 degree lag Restrictions Weight Bearing Restrictions: No Other Position/Activity Restrictions: WBAT    Mobility  Bed Mobility Overal bed mobility: Needs Assistance Bed Mobility: Supine to Sit     Supine to sit: Mod assist     General bed mobility comments: min cues for sequence and use of L LE to self assist plus increased time to complete scooting to EOB  Transfers Overall transfer level: Needs assistance Equipment used: Rolling walker (2 wheeled) Transfers: Sit to/from Stand Sit to Stand: Min guard         General transfer comment: cues for LE management and use of UEs to self assist.  pt uses forward momentum due to BMI  Ambulation/Gait Ambulation/Gait assistance: Min guard Ambulation Distance (Feet): 48 Feet Assistive device: Rolling walker (2 wheeled) Gait Pattern/deviations: Step-to pattern;Decreased stance time - right;Wide base of support Gait velocity: decr   General Gait Details: cues for sequence, posture and position from RW   Stairs            Wheelchair Mobility    Modified Rankin (Stroke Patients Only)       Balance                                     Cognition Arousal/Alertness: Awake/alert Behavior During Therapy: WFL for tasks assessed/performed Overall Cognitive Status: Within Functional Limits for tasks assessed                      Exercises   Total Knee Replacement TE's 10 reps B LE ankle pumps 10 reps towel squeezes 10 reps knee presses  Followed by [facility]     General Comments        Pertinent Vitals/Pain Pain Assessment: 0-10 Pain Score: 5  Pain Location: R knee Pain Descriptors / Indicators: Aching;Grimacing;Tender Pain Intervention(s): Monitored during session;Repositioned;Ice applied    Home Living                      Prior Function            PT Goals (current goals can now be found in the care plan section) Progress towards PT goals: Progressing toward goals    Frequency    7X/week      PT Plan Current plan remains appropriate    Co-evaluation             End of Session Equipment Utilized During Treatment: Gait belt;Right knee immobilizer Activity Tolerance: Patient tolerated treatment well Patient left: in chair;with call bell/phone within reach     Time: [id number] PT Time Calculation (  min) (ACUTE ONLY): 25 min  Charges:  $Gait Training: 8-22 mins $Therapeutic Activity: 8-22 mins                    G Codes:      Rica Koyanagi  PTA [facility]  Acute  Rehab Pager      [phone]

## 2015-12-14 NOTE — Progress Notes (Signed)
Subjective: 1 Day Post-Op Procedure(s) (LRB): RIGHT TOTAL KNEE ARTHROPLASTY (Right) Patient reports pain as moderate.  Has been up.  Feels good.  Acute blood loss anemia from surgery, but tolerating well.  Objective: Vital signs in last 24 hours: Temp:  [97.5 F (36.4 C)-99.3 F (37.4 C)] 98.8 F (37.1 C) (12/02 0615) Pulse Rate:  [79-98] 98 (12/02 0615) Resp:  [16-20] 16 (12/02 0615) BP: (100-140)/(56-94) 111/56 (12/02 0615) SpO2:  [92 %-100 %] 92 % (12/02 0615) Weight:  [320 lb (145.2 kg)] 320 lb (145.2 kg) (12/01 1143)  Intake/Output from previous day: 12/01 0701 - 12/02 0700 In: 5153.8 [P.O.:1080; I.V.:4023.8; IV Piggyback:50] Out: [id number] [Urine:3325; Blood:20] Intake/Output this shift: No intake/output data recorded.   Recent Labs  12/14/15 0447  HGB 9.1*    Recent Labs  12/14/15 0447  WBC 5.6  RBC 3.52*  HCT 28.6*  PLT 269    Recent Labs  12/14/15 0447  NA 139  K 3.9  CL 106  CO2 27  BUN 15  CREATININE 0.65  GLUCOSE 108*  CALCIUM 8.1*   No results for input(s): LABPT, INR in the last 72 hours.  Sensation intact distally Intact pulses distally Dorsiflexion/Plantar flexion intact Incision: no drainage No cellulitis present Compartment soft  Assessment/Plan: 1 Day Post-Op Procedure(s) (LRB): RIGHT TOTAL KNEE ARTHROPLASTY (Right) Up with therapy Discharge home with home health Monday.  Mcarthur Rossetti 12/14/2015, 8:50 AM

## 2015-12-15 LAB — CBC
HCT: 28.9 % — ABNORMAL LOW (ref 36.0–46.0)
HEMOGLOBIN: 9.3 g/dL — AB (ref 12.0–15.0)
MCH: 25.9 pg — AB (ref 26.0–34.0)
MCHC: 32.2 g/dL (ref 30.0–36.0)
MCV: 80.5 fL (ref 78.0–100.0)
Platelets: 265 10*3/uL (ref 150–400)
RBC: 3.59 MIL/uL — ABNORMAL LOW (ref 3.87–5.11)
RDW: 17.6 % — ABNORMAL HIGH (ref 11.5–15.5)
WBC: 6.6 10*3/uL (ref 4.0–10.5)

## 2015-12-15 MED ORDER — OXYCODONE-ACETAMINOPHEN 10-325 MG PO TABS: 1.0000 | ORAL_TABLET | ORAL | 0 refills | Status: DC | PRN

## 2015-12-15 MED ORDER — RIVAROXABAN 10 MG PO TABS: 10.0000 mg | ORAL_TABLET | Freq: Every day | ORAL | 0 refills | Status: DC

## 2015-12-15 MED ORDER — CYCLOBENZAPRINE HCL 10 MG PO TABS: 10.0000 mg | ORAL_TABLET | Freq: Three times a day (TID) | ORAL | 0 refills | Status: DC | PRN

## 2015-12-15 MED ORDER — FLUCONAZOLE 150 MG PO TABS
150.0000 mg | ORAL_TABLET | Freq: Once | ORAL | Status: AC
  Administered 2015-12-15: 150 mg via ORAL
  Filled 2015-12-15: qty 1

## 2015-12-15 NOTE — Progress Notes (Signed)
   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  Precautions  Precautions Knee;Fall  Restrictions  Weight Bearing Restrictions No  Pain Assessment  Pain Assessment 0-10  Pain Score 3  Pain Location R knee  Pain Descriptors / Indicators Sore  Pain Intervention(s) Limited activity within patient's tolerance;Monitored during session;Repositioned  Cognition  Arousal/Alertness Awake/alert  Behavior During Therapy WFL for tasks assessed/performed  Overall Cognitive Status Within Functional Limits for tasks assessed  Bed Mobility  Overal bed mobility Needs Assistance  Bed Mobility Supine to Sit  Supine to sit Min assist  Sit to supine Min assist  General bed mobility comments assist with RLE  Transfers  Overall transfer level Needs assistance  Equipment used Rolling walker (2 wheeled)  Transfers Sit to/from Stand  Sit to Stand Min guard;From elevated surface  General transfer comment min/guard for transtion from bed to RW  Ambulation/Gait  Ambulation/Gait assistance Min guard;Supervision  Ambulation Distance (Feet) 120 Feet  Assistive device Rolling walker (2 wheeled)  Gait Pattern/deviations Step-to pattern;Decreased step length - right;Decreased step length - left;Wide base of support;Trunk flexed  General Gait Details cues for RW position from self, posture  Total Joint Exercises  Ankle Circles/Pumps AROM;10 reps;Supine  Quad Sets Right;AROM;Supine  PT - End of Session  Equipment Utilized During Treatment Gait belt;Right knee immobilizer  Activity Tolerance Patient tolerated treatment well  Patient left in bed;with call bell/phone within reach;with bed alarm set  Nurse Communication Mobility status  PT - Assessment/Plan  PT Plan Current plan remains appropriate  PT Frequency (ACUTE ONLY) 7X/week  Follow Up Recommendations Home health PT  PT equipment  None recommended by PT  PT Goal Progression  Progress towards PT goals Progressing toward goals  Acute Rehab PT Goals  PT Goal Formulation With patient  Time For Goal Achievement 12/17/15  Potential to Achieve Goals Good  PT Time Calculation  PT Start Time (ACUTE ONLY) 1410  PT Stop Time (ACUTE ONLY) 1426  PT Time Calculation (min) (ACUTE ONLY) 16 min  PT General Charges  $$ ACUTE PT VISIT 1 Procedure  PT Treatments  $Gait Training 8-22 mins

## 2015-12-15 NOTE — Progress Notes (Signed)
Subjective: 2 Days Post-Op Procedure(s) (LRB): RIGHT TOTAL KNEE ARTHROPLASTY (Right) Patient reports pain as moderate.    Objective: Vital signs in last 24 hours: Temp:  [97.8 F (36.6 C)-100.2 F (37.9 C)] 99.9 F (37.7 C) (12/03 [id number]) Pulse Rate:  [93-107] 107 (12/03 0608) Resp:  [16] 16 (12/03 [id number]) BP: (144-157)/(78-81) 144/81 (12/03 0608) SpO2:  [96 %-99 %] 96 % (12/03 0858)  Intake/Output from previous day: 12/02 0701 - 12/03 0700 In: 1578.8 [P.O.:960; I.V.:618.8] Out: 2000 [Urine:2000] Intake/Output this shift: Total I/O In: 120 [P.O.:120] Out: 600 [Urine:600]   Recent Labs  12/14/15 0447 12/15/15 0431  HGB 9.1* 9.3*    Recent Labs  12/14/15 0447 12/15/15 0431  WBC 5.6 6.6  RBC 3.52* 3.59*  HCT 28.6* 28.9*  PLT 269 265    Recent Labs  12/14/15 0447  NA 139  K 3.9  CL 106  CO2 27  BUN 15  CREATININE 0.65  GLUCOSE 108*  CALCIUM 8.1*   No results for input(s): LABPT, INR in the last 72 hours.  Sensation intact distally Intact pulses distally Dorsiflexion/Plantar flexion intact Incision: dressing C/D/I No cellulitis present Compartment soft  Assessment/Plan: 2 Days Post-Op Procedure(s) (LRB): RIGHT TOTAL KNEE ARTHROPLASTY (Right) Up with therapy Plan for discharge tomorrow Discharge home with home health  Mcarthur Rossetti 12/15/2015, 11:50 AM

## 2015-12-15 NOTE — Progress Notes (Signed)
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 Worked on increased her distance and tolerance with walking, and her exercises.   Follow Up Recommendations  Home health PT     Equipment Recommendations  None recommended by PT    Recommendations for Other Services OT consult     Precautions / Restrictions Precautions Precautions: Knee;Fall Restrictions Weight Bearing Restrictions: No    Mobility  Bed Mobility Overal bed mobility: Needs Assistance Bed Mobility: Supine to Sit     Supine to sit: Min assist Sit to supine: Min assist   General bed mobility comments: assis twith R LE , other wise she manages her upper body   Transfers Overall transfer level: Needs assistance Equipment used: Rolling walker (2 wheeled) Transfers: Sit to/from Stand Sit to Stand: Min assist         General transfer comment: pt wanted the height of the bed elevated to prep fpr sit to stand.  Encouraged he not to raise the [facility] in order to mimic best her home environment . She states she will ahve help at home for stand   Ambulation/Gait Ambulation/Gait assistance: Min guard Ambulation Distance (Feet): 60 Feet Assistive device: Rolling walker (2 wheeled)       General Gait Details: continued to encourage step to pattern to minimize pain and increase safety.    Stairs            Wheelchair Mobility    Modified Rankin (Stroke Patients Only)       Balance                                    Cognition Arousal/Alertness: Awake/alert Behavior During Therapy: WFL for tasks assessed/performed Overall Cognitive Status: Within Functional Limits for tasks assessed                      Exercises Total Joint Exercises Ankle Circles/Pumps:  AROM;10 reps;Supine Quad Sets: Right;AROM;Supine Heel Slides: AAROM;Right;10 reps;Seated (reclined in recliner) Straight Leg Raises: AAROM;Right;10 reps;Seated (reclined in recliner )    General Comments        Pertinent Vitals/Pain Pain Assessment: 0-10 Pain Score: 2  Pain Location: R knee , increased in pain after walking to about 4/10 Pain Descriptors / Indicators: Aching;Burning Pain Intervention(s): Repositioned (pt did not want ice at this time )    Home Living                      Prior Function            PT Goals (current goals can now be found in the care plan section) Acute Rehab PT Goals PT Goal Formulation: With patient    Frequency    7X/week      PT Plan Current plan remains appropriate    Co-evaluation             End of Session Equipment Utilized During Treatment: Gait belt;Right knee immobilizer Activity Tolerance: Patient tolerated treatment well Patient left: in chair;with call bell/phone within reach     Time: 0945-1010 PT Time Calculation (min) (ACUTE ONLY): 25 min  Charges:  $Gait Training: 8-22 mins $Therapeutic Exercise:  8-22 mins                    G Codes:      Clide Dales Dec 20, 2015, 1:45 PM  Clide Dales, PT Pager: [phone] December 20, 2015

## 2015-12-16 NOTE — Discharge Summary (Signed)
Patient ID: Wendy Sanchez MRN: [id number] DOB/AGE: Jan 19, 1950 65 y.o.  Admit date: 12/13/2015 Discharge date: 12/16/2015  Admission Diagnoses:  Principal Problem:   Unilateral primary osteoarthritis, right knee Active Problems:   Status post total right knee replacement   Discharge Diagnoses:  Same  Past Medical History:  Diagnosis Date  . Allergic rhinitis   . Arthritis   . Asthma    problems with fumes and aerosols cause asthma  . Complication of anesthesia    awakens during surgery; has occurred with last 3-4 surgeries   . Environmental allergies    fumes   . Fibromyalgia   . GERD (gastroesophageal reflux disease)   . History of bronchitis   . Hypertension   . Numbness    hands bilat when driving; improves when not preforming task   . Status post gastric banding   . Tinnitus    comes and goes   . Vertigo    6 months ago approx     Surgeries: Procedure(s): RIGHT TOTAL KNEE ARTHROPLASTY on 12/13/2015   Consultants:   Discharged Condition: Improved  Hospital Course: ALINEA SIESS is an 66 y.o. female who was admitted 12/13/2015 for operative treatment ofUnilateral primary osteoarthritis, right knee. Patient has severe unremitting pain that affects sleep, daily activities, and work/hobbies. After pre-op clearance the patient was taken to the operating room on 12/13/2015 and underwent  Procedure(s): RIGHT TOTAL KNEE ARTHROPLASTY.    Patient was given perioperative antibiotics: Anti-infectives    Start     Dose/Rate Route Frequency Ordered Stop   12/15/15 1230  fluconazole (DIFLUCAN) tablet 150 mg     150 mg Oral  Once 12/15/15 1150 12/15/15 1412   12/13/15 1400  ceFAZolin (ANCEF) IVPB 2g/100 mL premix     2 g 200 mL/hr over 30 Minutes Intravenous Every 6 hours 12/13/15 1200 12/13/15 2017   12/13/15 0600  ceFAZolin (ANCEF) 3 g in dextrose 5 % 50 mL IVPB     3 g 130 mL/hr over 30 Minutes Intravenous On call to O.R. 12/12/15 1309 12/13/15 0805       Patient  was given sequential compression devices, early ambulation, and chemoprophylaxis to prevent DVT.  Patient benefited maximally from hospital stay and there were no complications.    Recent vital signs: Patient Vitals for the past 24 hrs:  BP Temp Temp src Pulse Resp SpO2  12/16/15 0638 117/69 98.8 F (37.1 C) Oral 73 18 98 %  12/15/15 2135 (!) 150/70 98 F (36.7 C) Oral 98 18 98 %  12/15/15 1945 - - - - - 95 %  12/15/15 1442 127/73 99.3 F (37.4 C) Oral 99 18 100 %  12/15/15 1158 - 98.1 F (36.7 C) Oral - - -  12/15/15 0858 - - - - - 96 %     Recent laboratory studies:  Recent Labs  12/14/15 0447 12/15/15 0431  WBC 5.6 6.6  HGB 9.1* 9.3*  HCT 28.6* 28.9*  PLT 269 265  NA 139  --   K 3.9  --   CL 106  --   CO2 27  --   BUN 15  --   CREATININE 0.65  --   GLUCOSE 108*  --   CALCIUM 8.1*  --      Discharge Medications:     Medication List    STOP taking these medications   HYDROcodone-acetaminophen 5-325 MG tablet Commonly known as:  NORCO   ibuprofen 800 MG tablet Commonly known as:  ADVIL,MOTRIN  traMADol 50 MG tablet Commonly known as:  ULTRAM     TAKE these medications   albuterol 108 (90 Base) MCG/ACT inhaler Commonly known as:  PROVENTIL HFA;VENTOLIN HFA Inhale 2 puffs into the lungs every 6 (six) hours as needed for wheezing or shortness of breath.   aspirin 325 MG EC tablet Take 1 tablet (325 mg total) by mouth 2 (two) times daily after a meal.   azelastine 0.1 % nasal spray Commonly known as:  ASTELIN Place 1 spray into both nostrils 2 (two) times daily as needed for allergies. Use in each nostril as directed   clotrimazole 1 % cream Commonly known as:  LOTRIMIN Apply 1 application topically 2 (two) times daily as needed (rash).   cyclobenzaprine 10 MG tablet Commonly known as:  FLEXERIL Take 1 tablet (10 mg total) by mouth 3 (three) times daily as needed for muscle spasms.   dexlansoprazole 60 MG capsule Commonly known as:   DEXILANT Take 60 mg by mouth daily.   diclofenac sodium 1 % Gel Commonly known as:  VOLTAREN Apply 2 g topically 4 (four) times daily.   docusate sodium 100 MG capsule Commonly known as:  COLACE Take 200 mg by mouth 2 (two) times daily.   Fluticasone-Salmeterol 250-50 MCG/DOSE Aepb Commonly known as:  ADVAIR Inhale 1 puff into the lungs 2 (two) times daily.   folic acid 1 MG tablet Commonly known as:  FOLVITE Take 1 mg by mouth daily.   furosemide 40 MG tablet Commonly known as:  LASIX Take 40 mg by mouth daily as needed (high blood pressure).   gabapentin 300 MG capsule Commonly known as:  NEURONTIN Take 2 capsules (600 mg total) by mouth 3 (three) times daily.   levocetirizine 5 MG tablet Commonly known as:  XYZAL Take 5 mg by mouth every evening.   MAXZIDE 75-50 MG tablet Generic drug:  triamterene-hydrochlorothiazide Take 1 tablet by mouth daily as needed (edema).   mirabegron ER 50 MG Tb24 tablet Commonly known as:  MYRBETRIQ Take 50 mg by mouth daily.   montelukast 10 MG tablet Commonly known as:  SINGULAIR Take 10 mg by mouth at bedtime.   MULTIVITAMIN GUMMIES ADULTS Chew Chew 2 tablets by mouth daily.   ICAPS AREDS 2 Caps Take 1 capsule by mouth 2 (two) times daily.   nortriptyline 50 MG capsule Commonly known as:  PAMELOR Take 50 mg by mouth at bedtime.   omega-3 acid ethyl esters 1 g capsule Commonly known as:  LOVAZA Take 1 g by mouth 2 (two) times a week. Monday and thursdays   oxyCODONE-acetaminophen 10-325 MG tablet Commonly known as:  PERCOCET Take 1 tablet by mouth every 4 (four) hours as needed for pain. What changed:  when to take this  reasons to take this   potassium chloride SA 20 MEQ tablet Commonly known as:  K-DUR,KLOR-CON Take 20 mEq by mouth 3 (three) times a week. Monday, Wednesday, and Saturday   PROBIOTIC COLON SUPPORT Caps Take 1 capsule by mouth at bedtime.   ramipril 10 MG capsule Commonly known as:   ALTACE Take 10 mg by mouth 2 (two) times daily.   rivaroxaban 10 MG Tabs tablet Commonly known as:  XARELTO Take 1 tablet (10 mg total) by mouth daily with breakfast.   sucralfate 1 g tablet Commonly known as:  CARAFATE Take 1 g by mouth 2 (two) times daily as needed (esophageal burning).   Vitamin D (Ergocalciferol) 50000 units Caps capsule Commonly known as:  DRISDOL  Take 50,000 Units by mouth every 7 (seven) days. thursdays            Durable Medical Equipment        Start     Ordered   12/13/15 1200  DME Walker rolling  Once    Question:  Patient needs a walker to treat with the following condition  Answer:  Status post total right knee replacement   12/13/15 1200   12/13/15 1200  DME 3 n 1  Once     12/13/15 1200      Diagnostic Studies: Dg Knee Right Port  Result Date: 12/13/2015 CLINICAL DATA:  Operative right knee joint replacement. EXAM: PORTABLE RIGHT KNEE - 1-2 VIEW COMPARISON:  Right knee series of September 09/01/2015 FINDINGS: The patient has undergone total right knee joint replacement. Radiographic positioning of the prosthetic components is good. The interface of the prosthetic components with the native bone is normal. Skin staples are present anteriorly. IMPRESSION: No immediate complication following right total knee joint replacement. Electronically Signed   By: David  Martinique M.D.   On: 12/13/2015 10:48    Disposition: 06-Home-Health Care Svc  Discharge Instructions    Call MD / Call 911    Complete by:  As directed    If you experience chest pain or shortness of breath, CALL 911 and be transported to the hospital emergency room.  If you develope a fever above 101 F, pus (Hudler drainage) or increased drainage or redness at the wound, or calf pain, call your surgeon's office.   Constipation Prevention    Complete by:  As directed    Drink plenty of fluids.  Prune juice may be helpful.  You may use a stool softener, such as Colace (over the counter)  100 mg twice a day.  Use MiraLax (over the counter) for constipation as needed.   Diet - low sodium heart healthy    Complete by:  As directed    Discharge patient    Complete by:  As directed    Increase activity slowly as tolerated    Complete by:  As directed       Follow-up Information    [facility] Follow up.   Specialty:  Home Health Services Why:  Home Health Physical Therapy Contact information: [place] [address] [phone]        Mcarthur Rossetti, MD Follow up in 2 week(s).   Specialty:  Orthopedic Surgery Contact information: [address] [place] [address] [phone]            Signed: Mcarthur Rossetti 12/16/2015, 7:30 AM

## 2015-12-16 NOTE — Progress Notes (Signed)
Patient ID: Wendy Sanchez, female   DOB: 1950-03-13, 65 y.o.   MRN: [id number] No acute changes.  Can be discharged to home today.  Vitals stable.  Slight couch, but lungs clear.  Knee stable.

## 2015-12-16 NOTE — Progress Notes (Signed)
Occupational Therapy Treatment Patient Details Name: Wendy Sanchez MRN: [id number] DOB: October 05, 1950 Today's Date: 12/16/2015    History of present illness Pt s/p R TKR and with hx of L TKR (7/17) and vertigo   OT comments  OT education complete.  Pt has Ae and needed DME. Sister will A as needed  Follow Up Recommendations  No OT follow up    Equipment Recommendations  None recommended by OT    Recommendations for Other Services      Precautions / Restrictions Precautions Precautions: Knee;Fall Restrictions Weight Bearing Restrictions: No       Mobility Bed Mobility               General bed mobility comments: pt in chair  Transfers Overall transfer level: Needs assistance Equipment used: Rolling walker (2 wheeled) Transfers: Sit to/from [facility] Sit to Stand: Supervision Stand pivot transfers: Supervision                ADL Overall ADL's : Needs assistance/impaired     Grooming: Standing;Supervision/safety           Upper Body Dressing : Supervision/safety;Sitting   Lower Body Dressing: Moderate assistance;Sit to/from stand;Cueing for safety;Cueing for sequencing;With adaptive equipment   Toilet Transfer: Ambulation;BSC;RW;Min guard   Toileting- Water quality scientist and Hygiene: Supervision/safety;Sit to/from stand;Cueing for safety;Cueing for sequencing;Cueing for compensatory techniques         General ADL Comments: Pt issued AE as pt voiced this would be a challenge.  Education provided and pt able to return demonstrate                Cognition   Behavior During Therapy: [facility] for tasks assessed/performed Overall Cognitive Status: Within Functional Limits for tasks assessed                             Prior Functioning/Environment              Frequency  Min 2X/week        Progress Toward Goals  OT Goals(current goals can now be found in the care plan section)  Progress towards OT  goals: Progressing toward goals     Plan Discharge plan remains appropriate       End of Session Equipment Utilized During Treatment: Rolling walker   Activity Tolerance Patient tolerated treatment well   Patient Left in chair;with call bell/phone within reach   Nurse Communication Mobility status        Time: [id number] OT Time Calculation (min): 26 min  Charges: OT General Charges $OT Visit: 1 Procedure OT Treatments $Self Care/Home Management : 23-37 mins  Joie Reamer, Thereasa Parkin 12/16/2015, 12:59 PM

## 2015-12-16 NOTE — Progress Notes (Signed)
Physical Therapy Treatment Patient Details Name: Wendy Sanchez MRN: [id number] DOB: 08/06/1950 Today's Date: 12/16/2015    History of Present Illness Pt s/p R TKR and with hx of L TKR (7/17) and vertigo    PT Comments    POD # 3 am session Assisted to [facility] then applied KI for amb and practiced one step forward with walker.  Assisted back to bed then performed TKR TE's following HEP handout.  Applied ICE and addressed all therapy questions.     Follow Up Recommendations  Home health PT     Equipment Recommendations  None recommended by PT    Recommendations for Other Services       Precautions / Restrictions Precautions Precautions: Knee;Fall Precaution Comments: instructed on KI use for amb and stairs Required Braces or Orthoses: Knee Immobilizer - Right Knee Immobilizer - Right: Discontinue once straight leg raise with < 10 degree lag Restrictions Weight Bearing Restrictions: No Other Position/Activity Restrictions: WBAT    Mobility  Bed Mobility Overal bed mobility: Needs Assistance Bed Mobility: Sit to Supine       Sit to supine: Mod assist   General bed mobility comments: assist R LE up onto bed  Transfers Overall transfer level: Needs assistance Equipment used: Rolling walker (2 wheeled) Transfers: Sit to/from Stand Sit to Stand: Supervision Stand pivot transfers: Supervision       General transfer comment: one VC safety with turn completion and R LE advancement  Ambulation/Gait Ambulation/Gait assistance: Supervision Ambulation Distance (Feet): 52 Feet Assistive device: Rolling walker (2 wheeled) Gait Pattern/deviations: Step-to pattern;Trunk flexed;Wide base of support Gait velocity: decreased   General Gait Details: increased time and one VC safety with turns using walker   Stairs Stairs: Yes Stairs assistance: Min guard Stair Management: No rails;Step to pattern;Forwards Number of Stairs: 1 General stair comments: 25% VC's on proper  sequencing and walker placement.  Also advised to wear KI for increased support.    Wheelchair Mobility    Modified Rankin (Stroke Patients Only)       Balance                                    Cognition Arousal/Alertness: Awake/alert Behavior During Therapy: WFL for tasks assessed/performed Overall Cognitive Status: Within Functional Limits for tasks assessed                      Exercises   Total Knee Replacement TE's 10 reps B LE ankle pumps 10 reps towel squeezes 10 reps knee presses 10 reps heel slides    5 reps SAQ's 10 reps SLR's AAROM  10 reps ABD Followed by ICE     General Comments        Pertinent Vitals/Pain Pain Assessment: 0-10 Pain Score: 6  Pain Location: R knee during TE's Pain Descriptors / Indicators: Sore;Tender Pain Intervention(s): Monitored during session;Repositioned;Ice applied    Home Living                      Prior Function            PT Goals (current goals can now be found in the care plan section) Progress towards PT goals: Progressing toward goals    Frequency    7X/week      PT Plan Current plan remains appropriate    Co-evaluation  End of Session Equipment Utilized During Treatment: Gait belt;Right knee immobilizer Activity Tolerance: Patient tolerated treatment well Patient left: in bed;with call bell/phone within reach;with bed alarm set     Time: 1020-1050 PT Time Calculation (min) (ACUTE ONLY): 30 min  Charges:  $Gait Training: 8-22 mins $Therapeutic Exercise: 8-22 mins                    G Codes:      Wendy Sanchez  PTA [facility]  Acute  Rehab Pager      [phone]

## 2015-12-16 NOTE — Progress Notes (Signed)
RN reviewed discharge instructions with patient and family. All questions answered.   Paperwork and prescriptions given.   NT rolled patient down with all belongings to family car. 

## 2015-12-17 ENCOUNTER — Telehealth (INDEPENDENT_AMBULATORY_CARE_PROVIDER_SITE_OTHER): Payer: Self-pay | Admitting: *Deleted

## 2015-12-17 NOTE — Telephone Encounter (Signed)
Kim needs verbal order for physical therapy for 3x a week for 2 weeks

## 2015-12-17 NOTE — Telephone Encounter (Signed)
Verbal order given on VM 

## 2015-12-30 ENCOUNTER — Ambulatory Visit (INDEPENDENT_AMBULATORY_CARE_PROVIDER_SITE_OTHER): Payer: Medicare Other | Admitting: Physician Assistant

## 2015-12-30 ENCOUNTER — Encounter (INDEPENDENT_AMBULATORY_CARE_PROVIDER_SITE_OTHER): Payer: Self-pay

## 2015-12-30 ENCOUNTER — Encounter (INDEPENDENT_AMBULATORY_CARE_PROVIDER_SITE_OTHER): Payer: Self-pay | Admitting: Physician Assistant

## 2015-12-30 DIAGNOSIS — Z96651 Presence of right artificial knee joint: Secondary | ICD-10-CM

## 2015-12-30 MED ORDER — OXYCODONE-ACETAMINOPHEN 10-325 MG PO TABS: 1.0000 | ORAL_TABLET | ORAL | 0 refills | Status: DC | PRN

## 2015-12-30 NOTE — Progress Notes (Signed)
Office Visit Note   Patient: Wendy Sanchez           Date of Birth: [birth year]           MRN: [id number] Visit Date: 12/30/2015              Requested by: [name], MD [address] [facility] [place], [place] [address] PCP: [name], MD   Assessment & Plan: Visit Diagnoses: No diagnosis found.  Plan:Staples removed steri strips applied. Physical therapy RX given ,   Follow-Up Instructions: Return in about 4 weeks (around 01/27/2016).   Orders:  No orders of the defined types were placed in this encounter.  Meds ordered this encounter  Medications  . oxyCODONE-acetaminophen (PERCOCET) 10-325 MG tablet    Sig: Take 1 tablet by mouth every 4 (four) hours as needed for pain.    Dispense:  60 tablet    Refill:  0      Procedures: No procedures performed   Clinical Data: No additional findings.   Subjective: Chief Complaint  Patient presents with  . Right Knee - Routine Post Op    HPI  Overall doing well no complaints. Pain worse at night . Ambulating with rolling walker.On Xarelto for DVT prophylaxis.  Review of Systems   Objective: Vital Signs: There were no vitals taken for this visit.  Physical Exam  Constitutional: She is oriented to person, place, and time. She appears well-developed and well-nourished. No distress.  Pulmonary/Chest: Effort normal.  Neurological: She is alert and oriented to person, place, and time.    Right Knee Exam   Comments:  Full extension and flexion to 85 degrees . Surgical incision benign. Skin well approximated wth staples, Calf supple non tender.      Specialty Comments:  No specialty comments available.  Imaging: No results found.   PMFS History: Patient Active Problem List   Diagnosis Date Noted  . Unilateral primary osteoarthritis, right knee 12/13/2015  . Status post total right knee replacement 12/13/2015  . Osteoarthritis of left knee 08/02/2015  . Status post total left knee  replacement 08/02/2015  . Spinal stenosis of lumbar region 06/19/2015  . Chronic pain syndrome 10/30/2014   Past Medical History:  Diagnosis Date  . Allergic rhinitis   . Arthritis   . Asthma    problems with fumes and aerosols cause asthma  . Complication of anesthesia    awakens during surgery; has occurred with last 3-4 surgeries   . Environmental allergies    fumes   . Fibromyalgia   . GERD (gastroesophageal reflux disease)   . History of bronchitis   . Hypertension   . Numbness    hands bilat when driving; improves when not preforming task   . Status post gastric banding   . Tinnitus    comes and goes   . Vertigo    6 months ago approx     Family History  Problem Relation Age of Onset  . Hypertension Father   . Deep vein thrombosis Father   . Dementia Mother   . Diabetes Sister   . Congestive Heart Failure Sister   . Congestive Heart Failure Brother   . Congestive Heart Failure Daughter   . Congestive Heart Failure Son   . Breast cancer Maternal Aunt     great aunt    Past Surgical History:  Procedure Laterality Date  . ABDOMINAL HYSTERECTOMY  1979   left ovary remains  . ABDOMINAL HYSTERECTOMY    .  CHOLECYSTECTOMY    . fibrous tissue removed from right shoulder and back of neck      2 years ago   . GANGLION CYST EXCISION    . GASTRIC BYPASS  1980   states had bypass with banding and band left in -  . KNEE SURGERY Bilateral    both knees  2001  . TONSILLECTOMY     age 43  . TOTAL KNEE ARTHROPLASTY Left 08/02/2015   Procedure: LEFT TOTAL KNEE ARTHROPLASTY;  Surgeon: Mcarthur Rossetti, MD;  Location: [facility];  Service: Orthopedics;  Laterality: Left;  . TOTAL KNEE ARTHROPLASTY Right 12/13/2015   Procedure: RIGHT TOTAL KNEE ARTHROPLASTY;  Surgeon: Mcarthur Rossetti, MD;  Location: [facility];  Service: Orthopedics;  Laterality: Right;   Social History   Occupational History  . Not on file.   Social History Main Topics  . Smoking status: Current  Every Day Smoker    Packs/day: 0.25    Years: 50.00    Types: Cigarettes  . Smokeless tobacco: Former Systems developer    Types: Snuff, Sarina Ser    Quit date: 01/13/1995     Comment: pt is determined to quit, she has cut down her cigarettes by 2/3 what she was smoking  . Alcohol use No  . Drug use: No  . Sexual activity: Not on file

## 2016-01-05 ENCOUNTER — Other Ambulatory Visit (INDEPENDENT_AMBULATORY_CARE_PROVIDER_SITE_OTHER): Payer: Self-pay | Admitting: Orthopaedic Surgery

## 2016-01-09 ENCOUNTER — Ambulatory Visit: Payer: Medicare Other | Attending: Anesthesiology | Admitting: Anesthesiology

## 2016-01-09 ENCOUNTER — Encounter: Payer: Self-pay | Admitting: Anesthesiology

## 2016-01-09 VITALS — BP 149/91 | HR 65 | Temp 97.7°F | Resp 16 | Ht 68.0 in | Wt 314.0 lb

## 2016-01-09 DIAGNOSIS — M48061 Spinal stenosis, lumbar region without neurogenic claudication: Secondary | ICD-10-CM | POA: Diagnosis not present

## 2016-01-09 DIAGNOSIS — M5136 Other intervertebral disc degeneration, lumbar region: Secondary | ICD-10-CM | POA: Diagnosis present

## 2016-01-09 DIAGNOSIS — G894 Chronic pain syndrome: Secondary | ICD-10-CM

## 2016-01-09 DIAGNOSIS — M539 Dorsopathy, unspecified: Secondary | ICD-10-CM | POA: Diagnosis not present

## 2016-01-09 DIAGNOSIS — M5387 Other specified dorsopathies, lumbosacral region: Secondary | ICD-10-CM

## 2016-01-09 MED ORDER — OXYCODONE-ACETAMINOPHEN 10-325 MG PO TABS: 1.0000 | ORAL_TABLET | ORAL | 0 refills | Status: DC | PRN

## 2016-01-09 NOTE — Patient Instructions (Signed)
GENERAL RISKS AND COMPLICATIONS  What are the risk, side effects and possible complications? Generally speaking, most procedures are safe.  However, with any procedure there are risks, side effects, and the possibility of complications.  The risks and complications are dependent upon the sites that are lesioned, or the type of nerve block to be performed.  The closer the procedure is to the spine, the more serious the risks are.  Great care is taken when placing the radio frequency needles, block needles or lesioning probes, but sometimes complications can occur. 1. Infection: Any time there is an injection through the skin, there is a risk of infection.  This is why sterile conditions are used for these blocks.  There are four possible types of infection. 1. Localized skin infection. 2. Central Nervous System Infection-This can be in the form of Meningitis, which can be deadly. 3. Epidural Infections-This can be in the form of an epidural abscess, which can cause pressure inside of the spine, causing compression of the spinal cord with subsequent paralysis. This would require an emergency surgery to decompress, and there are no guarantees that the patient would recover from the paralysis. 4. Discitis-This is an infection of the intervertebral discs.  It occurs in about 1% of discography procedures.  It is difficult to treat and it may lead to surgery.        2. Pain: the needles have to go through skin and soft tissues, will cause soreness.       3. Damage to internal structures:  The nerves to be lesioned may be near blood vessels or    other nerves which can be potentially damaged.       4. Bleeding: Bleeding is more common if the patient is taking blood thinners such as  aspirin, Coumadin, Ticiid, Plavix, etc., or if he/she have some genetic predisposition  such as hemophilia. Bleeding into the spinal canal can cause compression of the spinal  cord with subsequent paralysis.  This would require an  emergency surgery to  decompress and there are no guarantees that the patient would recover from the  paralysis.       5. Pneumothorax:  Puncturing of a lung is a possibility, every time a needle is introduced in  the area of the chest or upper back.  Pneumothorax refers to free air around the  collapsed lung(s), inside of the thoracic cavity (chest cavity).  Another two possible  complications related to a similar event would include: Hemothorax and Chylothorax.   These are variations of the Pneumothorax, where instead of air around the collapsed  lung(s), you may have blood or chyle, respectively.       6. Spinal headaches: They may occur with any procedures in the area of the spine.       7. Persistent CSF (Cerebro-Spinal Fluid) leakage: This is a rare problem, but may occur  with prolonged intrathecal or epidural catheters either due to the formation of a fistulous  track or a dural tear.       8. Nerve damage: By working so close to the spinal cord, there is always a possibility of  nerve damage, which could be as serious as a permanent spinal cord injury with  paralysis.       9. Death:  Although rare, severe deadly allergic reactions known as "Anaphylactic  reaction" can occur to any of the medications used.      10. Worsening of the symptoms:  We can always make thing worse.    What are the chances of something like this happening? Chances of any of this occuring are extremely low.  By statistics, you have more of a chance of getting killed in a motor vehicle accident: while driving to the hospital than any of the above occurring .  Nevertheless, you should be aware that they are possibilities.  In general, it is similar to taking a shower.  Everybody knows that you can slip, hit your head and get killed.  Does that mean that you should not shower again?  Nevertheless always keep in mind that statistics do not mean anything if you happen to be on the wrong side of them.  Even if a procedure has a 1  (one) in a 1,000,000 (million) chance of going wrong, it you happen to be that one..Also, keep in mind that by statistics, you have more of a chance of having something go wrong when taking medications.  Who should not have this procedure? If you are on a blood thinning medication (e.g. Coumadin, Plavix, see list of "Blood Thinners"), or if you have an active infection going on, you should not have the procedure.  If you are taking any blood thinners, please inform your physician.  How should I prepare for this procedure?  Do not eat or drink anything at least six hours prior to the procedure.  Bring a driver with you .  It cannot be a taxi.  Come accompanied by an adult that can drive you back, and that is strong enough to help you if your legs get weak or numb from the local anesthetic.  Take all of your medicines the morning of the procedure with just enough water to swallow them.  If you have diabetes, make sure that you are scheduled to have your procedure done first thing in the morning, whenever possible.  If you have diabetes, take only half of your insulin dose and notify our nurse that you have done so as soon as you arrive at the clinic.  If you are diabetic, but only take blood sugar pills (oral hypoglycemic), then do not take them on the morning of your procedure.  You may take them after you have had the procedure.  Do not take aspirin or any aspirin-containing medications, at least eleven (11) days prior to the procedure.  They may prolong bleeding.  Wear loose fitting clothing that may be easy to take off and that you would not mind if it got stained with Betadine or blood.  Do not wear any jewelry or perfume  Remove any nail coloring.  It will interfere with some of our monitoring equipment.  NOTE: Remember that this is not meant to be interpreted as a complete list of all possible complications.  Unforeseen problems may occur.  BLOOD THINNERS The following drugs  contain aspirin or other products, which can cause increased bleeding during surgery and should not be taken for 2 weeks prior to and 1 week after surgery.  If you should need take something for relief of minor pain, you may take acetaminophen which is found in Tylenol,m Datril, Anacin-3 and Panadol. It is not blood thinner. The products listed below are.  Do not take any of the products listed below in addition to any listed on your instruction sheet.  A.P.C or A.P.C with Codeine Codeine Phosphate Capsules #3 Ibuprofen Ridaura  ABC compound Congesprin Imuran rimadil  Advil Cope Indocin Robaxisal  Alka-Seltzer Effervescent Pain Reliever and Antacid Coricidin or Coricidin-D  Indomethacin Rufen    Alka-Seltzer plus Cold Medicine Cosprin Ketoprofen S-A-C Tablets  Anacin Analgesic Tablets or Capsules Coumadin Korlgesic Salflex  Anacin Extra Strength Analgesic tablets or capsules CP-2 Tablets Lanoril Salicylate  Anaprox Cuprimine Capsules Levenox Salocol  Anexsia-D Dalteparin Magan Salsalate  Anodynos Darvon compound Magnesium Salicylate Sine-off  Ansaid Dasin Capsules Magsal Sodium Salicylate  Anturane Depen Capsules Marnal Soma  APF Arthritis pain formula Dewitt's Pills Measurin Stanback  Argesic Dia-Gesic Meclofenamic Sulfinpyrazone  Arthritis Bayer Timed Release Aspirin Diclofenac Meclomen Sulindac  Arthritis pain formula Anacin Dicumarol Medipren Supac  Analgesic (Safety coated) Arthralgen Diffunasal Mefanamic Suprofen  Arthritis Strength Bufferin Dihydrocodeine Mepro Compound Suprol  Arthropan liquid Dopirydamole Methcarbomol with Aspirin Synalgos  ASA tablets/Enseals Disalcid Micrainin Tagament  Ascriptin Doan's Midol Talwin  Ascriptin A/D Dolene Mobidin Tanderil  Ascriptin Extra Strength Dolobid Moblgesic Ticlid  Ascriptin with Codeine Doloprin or Doloprin with Codeine Momentum Tolectin  Asperbuf Duoprin Mono-gesic Trendar  Aspergum Duradyne Motrin or Motrin IB Triminicin  Aspirin  plain, buffered or enteric coated Durasal Myochrisine Trigesic  Aspirin Suppositories Easprin Nalfon Trillsate  Aspirin with Codeine Ecotrin Regular or Extra Strength Naprosyn Uracel  Atromid-S Efficin Naproxen Ursinus  Auranofin Capsules Elmiron Neocylate Vanquish  Axotal Emagrin Norgesic Verin  Azathioprine Empirin or Empirin with Codeine Normiflo Vitamin E  Azolid Emprazil Nuprin Voltaren  Bayer Aspirin plain, buffered or children's or timed BC Tablets or powders Encaprin Orgaran Warfarin Sodium  Buff-a-Comp Enoxaparin Orudis Zorpin  Buff-a-Comp with Codeine Equegesic Os-Cal-Gesic   Buffaprin Excedrin plain, buffered or Extra Strength Oxalid   Bufferin Arthritis Strength Feldene Oxphenbutazone   Bufferin plain or Extra Strength Feldene Capsules Oxycodone with Aspirin   Bufferin with Codeine Fenoprofen Fenoprofen Pabalate or Pabalate-SF   Buffets II Flogesic Panagesic   Buffinol plain or Extra Strength Florinal or Florinal with Codeine Panwarfarin   Buf-Tabs Flurbiprofen Penicillamine   Butalbital Compound Four-way cold tablets Penicillin   Butazolidin Fragmin Pepto-Bismol   Carbenicillin Geminisyn Percodan   Carna Arthritis Reliever Geopen Persantine   Carprofen Gold's salt Persistin   Chloramphenicol Goody's Phenylbutazone   Chloromycetin Haltrain Piroxlcam   Clmetidine heparin Plaquenil   Cllnoril Hyco-pap Ponstel   Clofibrate Hydroxy chloroquine Propoxyphen         Before stopping any of these medications, be sure to consult the physician who ordered them.  Some, such as Coumadin (Warfarin) are ordered to prevent or treat serious conditions such as "deep thrombosis", "pumonary embolisms", and other heart problems.  The amount of time that you may need off of the medication may also vary with the medication and the reason for which you were taking it.  If you are taking any of these medications, please make sure you notify your pain physician before you undergo any  procedures.         Epidural Steroid Injection An epidural steroid injection is a shot of steroid medicine and numbing medicine that is given into the space between the spinal cord and the bones in your back (epidural space). The shot helps relieve pain caused by an irritated or swollen nerve root. The amount of pain relief you get from the injection depends on what is causing the nerve to be swollen and irritated, and how long your pain lasts. You are more likely to benefit from this injection if your pain is strong and comes on suddenly rather than if you have had pain for a long time. Tell a health care provider about:  Any allergies you have.  All medicines you are taking, including vitamins, herbs,   eye drops, creams, and over-the-counter medicines.  Any problems you or family members have had with anesthetic medicines.  Any blood disorders you have.  Any surgeries you have had.  Any medical conditions you have.  Whether you are pregnant or may be pregnant. What are the risks? Generally, this is a safe procedure. However, problems may occur, including:  Headache.  Bleeding.  Infection.  Allergic reaction to medicines.  Damage to your nerves.  What happens before the procedure? Staying hydrated Follow instructions from your health care provider about hydration, which may include:  Up to 2 hours before the procedure - you may continue to drink clear liquids, such as water, clear fruit juice, black coffee, and plain tea.  Eating and drinking restrictions Follow instructions from your health care provider about eating and drinking, which may include:  8 hours before the procedure - stop eating heavy meals or foods such as meat, fried foods, or fatty foods.  6 hours before the procedure - stop eating light meals or foods, such as toast or cereal.  6 hours before the procedure - stop drinking milk or drinks that contain milk.  2 hours before the procedure - stop  drinking clear liquids.  Medicine  You may be given medicines to lower anxiety.  Ask your health care provider about: ? Changing or stopping your regular medicines. This is especially important if you are taking diabetes medicines or blood thinners. ? Taking medicines such as aspirin and ibuprofen. These medicines can thin your blood. Do not take these medicines before your procedure if your health care provider instructs you not to. General instructions  Plan to have someone take you home from the hospital or clinic. What happens during the procedure?  You may receive a medicine to help you relax (sedative).  You will be asked to lie on your abdomen.  The injection site will be cleaned.  A numbing medicine (local anesthetic) will be used to numb the injection site.  A needle will be inserted through your skin into the epidural space. You may feel some discomfort when this happens. An X-ray machine will be used to make sure the needle is put as close as possible to the affected nerve.  A steroid medicine and a local anesthetic will be injected into the epidural space.  The needle will be removed.  A bandage (dressing) will be put over the injection site. What happens after the procedure?  Your blood pressure, heart rate, breathing rate, and blood oxygen level will be monitored until the medicines you were given have worn off.  Your arm or leg may feel weak or numb for a few hours.  The injection site may feel sore.  Do not drive for 24 hours if you received a sedative. This information is not intended to replace advice given to you by your health care provider. Make sure you discuss any questions you have with your health care provider. Document Released: 04/07/2007 Document Revised: 06/12/2015 Document Reviewed: 04/16/2015 Elsevier Interactive Patient Education  2017 [employer]  

## 2016-01-09 NOTE — Progress Notes (Signed)
Safety precautions to be maintained throughout the outpatient stay will include: orient to surroundings, keep bed in low position, maintain call bell within reach at all times, provide assistance with transfer out of bed and ambulation.  

## 2016-01-09 NOTE — Telephone Encounter (Signed)
Please adivse

## 2016-01-09 NOTE — Progress Notes (Signed)
CC:  Diffuse body pain  Procedure:  None  HPI:  Analys presents for reevaluation. She recently had knee surgery December 1 of this year. Dr. Rush Farmer did her surgery and she reports that he did prescribe Percocet for her for acute postoperative pain at that time. She has been taking that 3-4 times a day to assist with right acute postoperative knee pain. Her back pain has been stable and is without significant change however it is currently mildly worsened secondary to her physical therapy exercises. She didn't stop her ibuprofen secondary to Xarelto but she stopped Xarelto December 20 and I informed her is okay to start the ibuprofen January 2. She is still taking Flexeril 3 times a day, gabapentin 600 3 times a day and the Percocet 3-4 times a day. Otherwise the quality characteristic and distribution of her low back pain is stable in nature.   .BP (!) 149/91   Pulse 65   Temp 97.7 F (36.5 C) (Oral)   Resp 16   Ht 5\' 8"  (1.727 m)   Wt (!) 314 lb (142.4 kg)   SpO2 91%   BMI 47.74 kg/m    Current Outpatient Prescriptions:  .  albuterol (PROVENTIL HFA;VENTOLIN HFA) 108 (90 BASE) MCG/ACT inhaler, Inhale 2 puffs into the lungs every 6 (six) hours as needed for wheezing or shortness of breath., Disp: , Rfl:  .  azelastine (ASTELIN) 0.1 % nasal spray, Place 1 spray into both nostrils 2 (two) times daily as needed for allergies. Use in each nostril as directed, Disp: , Rfl:  .  clotrimazole (LOTRIMIN) 1 % cream, Apply 1 application topically 2 (two) times daily as needed (rash). , Disp: , Rfl:  .  cyclobenzaprine (FLEXERIL) 10 MG tablet, TAKE ONE TABLET BY MOUTH THREE TIMES DAILY AS NEEDED FOR MUSCLE SPASM, Disp: 60 tablet, Rfl: 0 .  dexlansoprazole (DEXILANT) 60 MG capsule, Take 60 mg by mouth daily., Disp: , Rfl:  .  diclofenac sodium (VOLTAREN) 1 % GEL, Apply 2 g topically 4 (four) times daily. , Disp: , Rfl:  .  docusate sodium (COLACE) 100 MG capsule, Take 200 mg by mouth 2 (two) times  daily., Disp: , Rfl:  .  Fluticasone-Salmeterol (ADVAIR) 250-50 MCG/DOSE AEPB, Inhale 1 puff into the lungs 2 (two) times daily., Disp: , Rfl:  .  folic acid (FOLVITE) 1 MG tablet, Take 1 mg by mouth daily., Disp: , Rfl:  .  gabapentin (NEURONTIN) 300 MG capsule, Take 2 capsules (600 mg total) by mouth 3 (three) times daily., Disp: 180 capsule, Rfl: 1 .  HYDROcodone-acetaminophen (NORCO/VICODIN) 5-325 MG tablet, TAKE 1 OR 2 TABLETS BY MOUTH EVERY 8 TO 12 HOURS AS NEEDED FOR PAIN, Disp: , Rfl: 0 .  levocetirizine (XYZAL) 5 MG tablet, Take 5 mg by mouth every evening., Disp: , Rfl:  .  mirabegron ER (MYRBETRIQ) 50 MG TB24 tablet, Take 50 mg by mouth daily., Disp: , Rfl:  .  montelukast (SINGULAIR) 10 MG tablet, Take 10 mg by mouth at bedtime., Disp: , Rfl:  .  Multiple Vitamins-Minerals (ICAPS AREDS 2) CAPS, Take 1 capsule by mouth 2 (two) times daily. , Disp: , Rfl:  .  Multiple Vitamins-Minerals (MULTIVITAMIN GUMMIES ADULTS) CHEW, Chew 2 tablets by mouth daily. , Disp: , Rfl:  .  nortriptyline (PAMELOR) 50 MG capsule, Take 50 mg by mouth at bedtime., Disp: , Rfl:  .  omega-3 acid ethyl esters (LOVAZA) 1 g capsule, Take 1 g by mouth 2 (two) times a week.  Monday and thursdays, Disp: , Rfl:  .  oxyCODONE-acetaminophen (PERCOCET) 10-325 MG tablet, Take 1 tablet by mouth every 4 (four) hours as needed for pain., Disp: 90 tablet, Rfl: 0 .  potassium chloride SA (K-DUR,KLOR-CON) 20 MEQ tablet, Take 20 mEq by mouth 3 (three) times a week. Monday, Wednesday, and Saturday, Disp: , Rfl:  .  Probiotic Product (PROBIOTIC COLON SUPPORT) CAPS, Take 1 capsule by mouth at bedtime., Disp: , Rfl:  .  ramipril (ALTACE) 10 MG capsule, Take 10 mg by mouth 2 (two) times daily., Disp: , Rfl:  .  sucralfate (CARAFATE) 1 G tablet, Take 1 g by mouth 2 (two) times daily as needed (esophageal burning). , Disp: , Rfl:  .  triamterene-hydrochlorothiazide (MAXZIDE) 75-50 MG tablet, Take 1 tablet by mouth daily as needed (edema). ,  Disp: , Rfl:  .  Vitamin D, Ergocalciferol, (DRISDOL) 50000 UNITS CAPS capsule, Take 50,000 Units by mouth every 7 (seven) days. thursdays, Disp: , Rfl:  .  aspirin EC 325 MG EC tablet, Take 1 tablet (325 mg total) by mouth 2 (two) times daily after a meal. (Patient not taking: Reported on 01/09/2016), Disp: 30 tablet, Rfl: 0 .  furosemide (LASIX) 40 MG tablet, Take 40 mg by mouth daily as needed (high blood pressure). , Disp: , Rfl:  .  rivaroxaban (XARELTO) 10 MG TABS tablet, Take 1 tablet (10 mg total) by mouth daily with breakfast. (Patient not taking: Reported on 01/09/2016), Disp: 20 tablet, Rfl: 0  Patient Active Problem List   Diagnosis Date Noted  . Unilateral primary osteoarthritis, right knee 12/13/2015  . Hx of total knee arthroplasty 12/13/2015  . Osteoarthritis of left knee 08/02/2015  . 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 Spinal stenosis of lumbar region 06/19/2015  . Chronic pain syndrome 10/30/2014  's medical history significant for essential hypertension  Hyperlipidemia  Asthma Seasonal allergies Hemorrhoids  history of migraines  and obesity  generalized abdominal pain Osteoarthritis  Chickenpox  Cigarette smoking  Past surgeries include abdominal hysterectomy with partial vaginectomy  Gastric bypass 7  Rotator cuff repair  Left wrist surgery  Laparoscopy with gastroplasty and vertical banding for obesity       Allergies  Allergen Reactions  . Iodine Anaphylaxis and Swelling  . Other     ALLERGY TO METAL - BLACKENS SKIN AND CAUSES A RASH   . Tape Swelling    Skin comes off    family history significant for colon polyps hepatitis C and lupus   Review of systems: Cardiac: No angina GI: Occasional constipation well controlled Physical exam:    PERRL, EOMI  Alert and Ox3  H:  RRR   Lungs:  CTA Inspection of the low back reveals paraspinous muscle tenderness but no overt trigger points. She does have a positive  straight leg raise on the right side greater than left. Her muscle tone and bulk is good.  Assessment:  1. 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  Sleep apnea   5.  Chronic narcotic usage for generalized pain   6.  Evidence of multi prescriber opioid prescription  Plan:    1. Sammuel Hines reports that she has received opioids from her orthopedic doctor. Based on the practitioner database review my nurse and myself these are appropriate. We will therefore fill her next opioid  dose for mid January and then again in February of the following month.   2 We talked about her need to proceed with efforts at continued weight loss and continue with physical  therapy exercises and continue follow-up with her primary care.   3. I'm going to schedule her for an L5-S1 epidural steroid as she reports that these have given her relief of the lower back and bilateral lower leg pain that she is currently experiencing. As would be for bilateral L5 and S1 radiculitis.   Dr. Vashti Hey MD 2:42 PM

## 2016-01-22 ENCOUNTER — Other Ambulatory Visit: Payer: Self-pay | Admitting: Family Medicine

## 2016-01-22 DIAGNOSIS — N644 Mastodynia: Secondary | ICD-10-CM

## 2016-01-24 ENCOUNTER — Encounter: Payer: Self-pay | Admitting: Emergency Medicine

## 2016-01-24 ENCOUNTER — Ambulatory Visit (INDEPENDENT_AMBULATORY_CARE_PROVIDER_SITE_OTHER): Payer: Medicare Other

## 2016-01-24 ENCOUNTER — Ambulatory Visit
Admission: EM | Admit: 2016-01-24 | Discharge: 2016-01-24 | Disposition: A | Discharge status: Home / Self Care | Payer: Medicare Other | Attending: Family Medicine | Admitting: Family Medicine

## 2016-01-24 DIAGNOSIS — R079 Chest pain, unspecified: Secondary | ICD-10-CM | POA: Insufficient documentation

## 2016-01-24 DIAGNOSIS — I517 Cardiomegaly: Secondary | ICD-10-CM | POA: Insufficient documentation

## 2016-01-24 DIAGNOSIS — R0602 Shortness of breath: Secondary | ICD-10-CM | POA: Insufficient documentation

## 2016-01-24 DIAGNOSIS — R071 Chest pain on breathing: Secondary | ICD-10-CM

## 2016-01-24 DIAGNOSIS — J4521 Mild intermittent asthma with (acute) exacerbation: Secondary | ICD-10-CM

## 2016-01-24 MED ORDER — ALBUTEROL SULFATE (2.5 MG/3ML) 0.083% IN NEBU: 2.5000 mg | INHALATION_SOLUTION | Freq: Four times a day (QID) | RESPIRATORY_TRACT | 0 refills | Status: DC | PRN

## 2016-01-24 MED ORDER — DOXYCYCLINE HYCLATE 100 MG PO TABS: 100.0000 mg | ORAL_TABLET | Freq: Two times a day (BID) | ORAL | 0 refills | Status: DC

## 2016-01-24 MED ORDER — CLONIDINE HCL 0.1 MG PO TABS
0.1000 mg | ORAL_TABLET | Freq: Once | ORAL | Status: AC
  Administered 2016-01-24: 0.1 mg via ORAL

## 2016-01-24 MED ORDER — IPRATROPIUM-ALBUTEROL 0.5-2.5 (3) MG/3ML IN SOLN
3.0000 mL | Freq: Once | RESPIRATORY_TRACT | Status: AC
  Administered 2016-01-24: 3 mL via RESPIRATORY_TRACT

## 2016-01-24 MED ORDER — PREDNISONE 20 MG PO TABS: ORAL_TABLET | ORAL | 0 refills | Status: DC

## 2016-01-24 NOTE — ED Triage Notes (Signed)
Patient c/o chest pain, SOB and sweating that started last night.  Patient c/o cough for the past 2-3 days.

## 2016-01-24 NOTE — ED Notes (Signed)
Patient states that she was started Harrison on December 1.  Patient states that she has not been taking Altace because her blood pressures had been normal.

## 2016-01-30 ENCOUNTER — Ambulatory Visit (INDEPENDENT_AMBULATORY_CARE_PROVIDER_SITE_OTHER): Payer: Medicare Other | Admitting: Physician Assistant

## 2016-02-01 ENCOUNTER — Other Ambulatory Visit: Payer: Self-pay | Admitting: Anesthesiology

## 2016-02-12 ENCOUNTER — Ambulatory Visit (INDEPENDENT_AMBULATORY_CARE_PROVIDER_SITE_OTHER): Payer: Medicare Other | Admitting: Physician Assistant

## 2016-02-12 DIAGNOSIS — Z96653 Presence of artificial knee joint, bilateral: Secondary | ICD-10-CM

## 2016-02-12 MED ORDER — OXYCODONE-ACETAMINOPHEN 10-325 MG PO TABS: 1.0000 | ORAL_TABLET | Freq: Four times a day (QID) | ORAL | 0 refills | Status: DC | PRN

## 2016-02-12 NOTE — Progress Notes (Signed)
Office Visit Note   Patient: Wendy Sanchez           Date of Birth: 1950/06/25           MRN: [id number] Visit Date: 02/12/2016              Requested by: [name], MD [address] [facility] [place], [place] [address] PCP: [name], MD   Assessment & Plan: Visit Diagnoses: No diagnosis found.  Plan: She'll continue to work on range of motion strengthening bilateral knees and weight loss. We will see Her back next October and at that time obtain AP and lateral views of both knees. She understands that if she has any questions or concerns she can follow-up sooner  Follow-Up Instructions: Return in about 8 months (around 10/11/2016) for Radiographs bilateral knees.   Orders:  No orders of the defined types were placed in this encounter.  Meds ordered this encounter  Medications  . oxyCODONE-acetaminophen (PERCOCET) 10-325 MG tablet    Sig: Take 1-2 tablets by mouth every 6 (six) hours as needed for pain.    Dispense:  60 tablet    Refill:  0    Do not fill until [id number]      Procedures: No procedures performed   Clinical Data: No additional findings.   Subjective: No chief complaint on file.   HPI Wendy Sanchez returns just 2 months status post right total knee arthroplasty and 6 months status post left total knee arthroplasty both knees are doing well she was discharged from physical therapy as of yesterday. For 1 final refill for Percocet which she uses sparingly. She is able to walk for about an hour before she has to sit down and this is a great improvement for which she had preop both knees. Review of Systems   Objective: Vital Signs: There were no vitals taken for this visit.  Physical Exam  Ortho Exam Excellent range of motion of bilateral knees. Instability valgus varus stressing. There is no rashes skin lesions ulcerations erythema or signs of infection. Surgical incisions are well healed. Calves supple and nontender. Specialty  Comments:  No specialty comments available.  Imaging: No results found.   PMFS History: Patient Active Problem List   Diagnosis Date Noted  . Unilateral primary osteoarthritis, right knee 12/13/2015  . Hx of total knee arthroplasty 12/13/2015  . Osteoarthritis of left knee 08/02/2015  . 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 Spinal stenosis of lumbar region 06/19/2015  . Chronic pain syndrome 10/30/2014   Past Medical History:  Diagnosis Date  . Allergic rhinitis   . Arthritis   . Asthma    problems with fumes and aerosols cause asthma  . Complication of anesthesia    awakens during surgery; has occurred with last 3-4 surgeries   . Environmental allergies    fumes   . Fibromyalgia   . GERD (gastroesophageal reflux disease)   . History of bronchitis   . Hypertension   . Numbness    hands bilat when driving; improves when not preforming task   . Status post gastric banding   . Tinnitus    comes and goes   . Vertigo    6 months ago approx     Family History  Problem Relation Age of Onset  . Hypertension Father   . Deep vein thrombosis Father   . Dementia Mother   . Diabetes Sister   . Congestive Heart Failure Sister   .  Congestive Heart Failure Brother   . Congestive Heart Failure Daughter   . Congestive Heart Failure Son   . Breast cancer Maternal Aunt     great aunt    Past Surgical History:  Procedure Laterality Date  . ABDOMINAL HYSTERECTOMY  1979   left ovary remains  . ABDOMINAL HYSTERECTOMY    . CHOLECYSTECTOMY    . fibrous tissue removed from right shoulder and back of neck      2 years ago   . GANGLION CYST EXCISION    . GASTRIC BYPASS  1980   states had bypass with banding and band left in -  . KNEE SURGERY Bilateral    both knees  2001  . TONSILLECTOMY     age 80  . TOTAL KNEE ARTHROPLASTY Left 08/02/2015   Procedure: LEFT TOTAL KNEE ARTHROPLASTY;  Surgeon: Mcarthur Rossetti, MD;  Location: [facility];  Service: Orthopedics;   Laterality: Left;  . TOTAL KNEE ARTHROPLASTY Right 12/13/2015   Procedure: RIGHT TOTAL KNEE ARTHROPLASTY;  Surgeon: Mcarthur Rossetti, MD;  Location: [facility];  Service: Orthopedics;  Laterality: Right;   Social History   Occupational History  . Not on file.   Social History Main Topics  . Smoking status: Former Smoker    Packs/day: 0.25    Years: 50.00  . Smokeless tobacco: Former Systems developer    Types: Snuff, Sarina Ser    Quit date: 01/13/1995     Comment: pt is determined to quit, she has cut down her cigarettes by 2/3 what she was smoking  . Alcohol use No  . Drug use: No  . Sexual activity: Not on file

## 2016-03-19 ENCOUNTER — Encounter: Payer: Self-pay | Admitting: Anesthesiology

## 2016-03-19 ENCOUNTER — Ambulatory Visit: Payer: Medicare Other | Attending: Anesthesiology | Admitting: Anesthesiology

## 2016-03-19 VITALS — BP 129/80 | HR 96 | Temp 98.2°F | Resp 18 | Ht 68.0 in | Wt 308.0 lb

## 2016-03-19 DIAGNOSIS — G8929 Other chronic pain: Secondary | ICD-10-CM

## 2016-03-19 DIAGNOSIS — M48061 Spinal stenosis, lumbar region without neurogenic claudication: Secondary | ICD-10-CM | POA: Diagnosis not present

## 2016-03-19 DIAGNOSIS — G894 Chronic pain syndrome: Secondary | ICD-10-CM

## 2016-03-19 DIAGNOSIS — Z029 Encounter for administrative examinations, unspecified: Secondary | ICD-10-CM | POA: Diagnosis not present

## 2016-03-19 DIAGNOSIS — M545 Low back pain, unspecified: Secondary | ICD-10-CM

## 2016-03-19 DIAGNOSIS — M539 Dorsopathy, unspecified: Secondary | ICD-10-CM

## 2016-03-19 DIAGNOSIS — M5387 Other specified dorsopathies, lumbosacral region: Secondary | ICD-10-CM

## 2016-03-19 MED ORDER — OXYCODONE-ACETAMINOPHEN 10-325 MG PO TABS: 1.0000 | ORAL_TABLET | Freq: Four times a day (QID) | ORAL | 0 refills | Status: DC | PRN

## 2016-03-19 NOTE — Progress Notes (Signed)
Nursing Pain Medication Assessment:  Safety precautions to be maintained throughout the outpatient stay will include: orient to surroundings, keep bed in low position, maintain call bell within reach at all times, provide assistance with transfer out of bed and ambulation.  Medication Inspection Compliance: Wendy Sanchez did not comply with our request to bring her pills to be counted. She was reminded that bringing the medication bottles, even when empty, is a requirement. Pill/Patch Count: None available to be counted. Bottle Appearance: No container available. Did not bring bottle(s) to appointment. Medication: None brought in. Filled Date: N/A Last Medication intake:  Today

## 2016-03-19 NOTE — Patient Instructions (Signed)
Patient given Rx for oxycodone-apap 10-325 mg x 2 months to begin filling on 04/01/16

## 2016-03-20 NOTE — Progress Notes (Signed)
CC:  Diffuse body pain  Procedure:  None  HPI:  Wendy Sanchez presents for reevaluation. She reports that she been doing well over the past 2 months. She's been taking her medications as prescribed with no evidence of diverting or illicit use. We have reviewed her practitioner database information and is expected and consistent with her current regimen. The quality characteristic addition reach for pain remains stable with no changes in lower extremity strength or function or bowel bladder function. She continues to derive an improved lifestyle performance with the medications  .BP 129/80   Pulse 96   Temp 98.2 F (36.8 C) (Oral)   Resp 18   Ht 5\' 8"  (1.727 m)   Wt (!) 308 lb (139.7 kg)   SpO2 100%   BMI 46.83 kg/m    Current Outpatient Prescriptions:  .  azelastine (ASTELIN) 0.1 % nasal spray, Place 1 spray into both nostrils 2 (two) times daily as needed for allergies. Use in each nostril as directed, Disp: , Rfl:  .  clotrimazole (LOTRIMIN) 1 % cream, Apply 1 application topically 2 (two) times daily as needed (rash). , Disp: , Rfl:  .  cyclobenzaprine (FLEXERIL) 10 MG tablet, TAKE ONE TABLET BY MOUTH THREE TIMES DAILY AS NEEDED FOR MUSCLE SPASM, Disp: 60 tablet, Rfl: 0 .  dexlansoprazole (DEXILANT) 60 MG capsule, Take 60 mg by mouth daily., Disp: , Rfl:  .  diclofenac sodium (VOLTAREN) 1 % GEL, Apply 2 g topically 4 (four) times daily. , Disp: , Rfl:  .  docusate sodium (COLACE) 100 MG capsule, Take 200 mg by mouth 2 (two) times daily., Disp: , Rfl:  .  Fluticasone-Salmeterol (ADVAIR) 250-50 MCG/DOSE AEPB, Inhale 1 puff into the lungs 2 (two) times daily., Disp: , Rfl:  .  folic acid (FOLVITE) 1 MG tablet, Take 1 mg by mouth daily., Disp: , Rfl:  .  furosemide (LASIX) 40 MG tablet, Take 40 mg by mouth daily as needed (high blood pressure). , Disp: , Rfl:  .  gabapentin (NEURONTIN) 300 MG capsule, TAKE TWO CAPSULES BY MOUTH THREE TIMES DAILY, Disp: 180 capsule, Rfl: 1 .  ibuprofen  (ADVIL,MOTRIN) 800 MG tablet, Take 800 mg by mouth 3 (three) times daily., Disp: , Rfl:  .  levocetirizine (XYZAL) 5 MG tablet, Take 5 mg by mouth every evening., Disp: , Rfl:  .  mirabegron ER (MYRBETRIQ) 50 MG TB24 tablet, Take 50 mg by mouth daily., Disp: , Rfl:  .  montelukast (SINGULAIR) 10 MG tablet, Take 10 mg by mouth at bedtime., Disp: , Rfl:  .  Multiple Vitamins-Minerals (ICAPS AREDS 2) CAPS, Take 1 capsule by mouth 2 (two) times daily. , Disp: , Rfl:  .  Multiple Vitamins-Minerals (MULTIVITAMIN GUMMIES ADULTS) CHEW, Chew 2 tablets by mouth daily. , Disp: , Rfl:  .  nortriptyline (PAMELOR) 50 MG capsule, Take 50 mg by mouth at bedtime., Disp: , Rfl:  .  omega-3 acid ethyl esters (LOVAZA) 1 g capsule, Take 1 g by mouth 2 (two) times a week. Monday and thursdays, Disp: , Rfl:  .  oxyCODONE-acetaminophen (PERCOCET) 10-325 MG tablet, Take 1-2 tablets by mouth every 6 (six) hours as needed for pain., Disp: 60 tablet, Rfl: 0 .  oxyCODONE-acetaminophen (PERCOCET) 10-325 MG tablet, Take 1-2 tablets by mouth every 6 (six) hours as needed for pain., Disp: 60 tablet, Rfl: 0 .  potassium chloride SA (K-DUR,KLOR-CON) 20 MEQ tablet, Take 20 mEq by mouth 3 (three) times a week. Monday, Wednesday, and Saturday, Disp: ,  Rfl:  .  Probiotic Product (PROBIOTIC COLON SUPPORT) CAPS, Take 1 capsule by mouth at bedtime., Disp: , Rfl:  .  ramipril (ALTACE) 10 MG capsule, Take 10 mg by mouth as needed. , Disp: , Rfl:  .  sucralfate (CARAFATE) 1 G tablet, Take 1 g by mouth 2 (two) times daily as needed (esophageal burning). , Disp: , Rfl:  .  triamterene-hydrochlorothiazide (MAXZIDE) 75-50 MG tablet, Take 1 tablet by mouth daily as needed (edema). , Disp: , Rfl:  .  Vitamin D, Ergocalciferol, (DRISDOL) 50000 UNITS CAPS capsule, Take 50,000 Units by mouth every 7 (seven) days. thursdays, Disp: , Rfl:   Patient Active Problem List   Diagnosis Date Noted  . Unilateral primary osteoarthritis, right knee 12/13/2015   . Hx of total knee arthroplasty 12/13/2015  . Osteoarthritis of left knee 08/02/2015  . 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 Spinal stenosis of lumbar region 06/19/2015  . Chronic pain syndrome 10/30/2014  Past medical history significant for essential hypertension  Hyperlipidemia  Asthma Seasonal allergies Hemorrhoids  history of migraines  and obesity  generalized abdominal pain Osteoarthritis  Chickenpox  Cigarette smoking  Past surgeries include abdominal hysterectomy with partial vaginectomy  Gastric bypass 7  Rotator cuff repair  Left wrist surgery  Laparoscopy with gastroplasty and vertical banding for obesity       Allergies  Allergen Reactions  . Iodine Anaphylaxis and Swelling  . Other     ALLERGY TO METAL - BLACKENS SKIN AND CAUSES A RASH   . Tape Swelling    Skin comes off    family history significant for colon polyps hepatitis C and lupus   Review of systems: Cardiac: No angina GI: Occasional constipation well controlled Physical exam:    PERRL, EOMI  Alert and Ox3  H:  RRR   Lungs:  CTA I Assessment:  1. 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  Sleep apnea   5.  Chronic narcotic usage for generalized pain   6.  Evidence of multi prescriber opioid prescription  Plan:    1. We have gone over the importance of compliance with the clinic policies regarding opioids once again. There is no evidence of any concern today. We have instructed her that should she receive any opioids for any type of perioperative pain that we be alerted to this..   2 We talked about her need to proceed with efforts at continued weight loss and continue with physical  therapy exercises and continue follow-up with her primary care.   3. We will defer on any repeat  injections at this time however would consider an L5-S1 epidural for her back pain if this worsens.  Dr. Vashti Hey MD 12:30 PM

## 2016-03-26 ENCOUNTER — Ambulatory Visit
Admission: RE | Admit: 2016-03-26 | Discharge: 2016-03-26 | Disposition: A | Discharge status: Home / Self Care | Payer: Medicare Other | Source: Ambulatory Visit | Attending: Family Medicine | Admitting: Family Medicine

## 2016-03-26 DIAGNOSIS — N644 Mastodynia: Secondary | ICD-10-CM

## 2016-03-31 ENCOUNTER — Other Ambulatory Visit: Payer: Self-pay | Admitting: Anesthesiology

## 2016-03-31 ENCOUNTER — Other Ambulatory Visit (INDEPENDENT_AMBULATORY_CARE_PROVIDER_SITE_OTHER): Payer: Self-pay | Admitting: Orthopaedic Surgery

## 2016-03-31 NOTE — Telephone Encounter (Signed)
Please advise 

## 2016-04-02 NOTE — Telephone Encounter (Signed)
Patient states she was supposed to have 2 meds escripted last visit, for 90 supply each, pharmacy is telling her they do not have any scripts for her. Please let patient know status

## 2016-04-06 ENCOUNTER — Other Ambulatory Visit: Payer: Self-pay | Admitting: *Deleted

## 2016-04-06 MED ORDER — IBUPROFEN 800 MG PO TABS: 800.0000 mg | ORAL_TABLET | Freq: Three times a day (TID) | ORAL | 1 refills | Status: DC

## 2016-04-06 NOTE — Telephone Encounter (Signed)
Patient did not get medication escribed at last visit of 03/19/16.  Last Rx was 01/18 with 1 refill.

## 2016-04-06 NOTE — Telephone Encounter (Signed)
Talked with Wendy Sanchez.  Gabapentin and ibuprofen escribed/reordered for her.  Patient has question re; qty of percocet from last visit only being 72 when it had been 54.  Will speak with Dr Andree Elk on tomorrow when he is in clinic. Patient verbalizes u/o information.

## 2016-04-20 ENCOUNTER — Encounter (INDEPENDENT_AMBULATORY_CARE_PROVIDER_SITE_OTHER): Payer: Self-pay

## 2016-04-20 ENCOUNTER — Ambulatory Visit (INDEPENDENT_AMBULATORY_CARE_PROVIDER_SITE_OTHER): Payer: Medicare Other | Admitting: Physician Assistant

## 2016-04-20 ENCOUNTER — Ambulatory Visit (INDEPENDENT_AMBULATORY_CARE_PROVIDER_SITE_OTHER): Payer: Medicare Other

## 2016-04-20 ENCOUNTER — Encounter (INDEPENDENT_AMBULATORY_CARE_PROVIDER_SITE_OTHER): Payer: Self-pay | Admitting: Physician Assistant

## 2016-04-20 DIAGNOSIS — M25562 Pain in left knee: Secondary | ICD-10-CM | POA: Diagnosis not present

## 2016-04-20 DIAGNOSIS — M7050 Other bursitis of knee, unspecified knee: Secondary | ICD-10-CM | POA: Diagnosis not present

## 2016-04-20 MED ORDER — METHYLPREDNISOLONE ACETATE 40 MG/ML IJ SUSP
40.0000 mg | INTRAMUSCULAR | Status: AC | PRN
  Administered 2016-04-20: 40 mg via INTRAMUSCULAR

## 2016-04-20 MED ORDER — LIDOCAINE HCL 1 % IJ SOLN
0.5000 mL | INTRAMUSCULAR | Status: AC | PRN
Start: 2016-04-20 — End: 2016-04-20
  Administered 2016-04-20: .5 mL

## 2016-04-20 NOTE — Progress Notes (Signed)
Office Visit Note   Patient: Wendy Sanchez           Date of Birth: 1950/08/22           MRN: [id number] Visit Date: 04/20/2016              Requested by: [name], MD [address] [facility] [place], [place] [address] PCP: [name], MD   Assessment & Plan: Visit Diagnoses:  1. Left knee pain, unspecified chronicity   2. Pes anserine bursitis     Plan: Have her continue to work on strengthening of both legs. She'll use Voltaren gel 4 times daily over the left knee. Ice heat as needed. Follow with [place] in a month check her progress or lack of.  Follow-Up Instructions: Return in about 4 weeks (around 05/18/2016).   Orders:  Orders Placed This Encounter  Procedures  . Trigger Point Injection  . XR Knee 1-2 Views Left   No orders of the defined types were placed in this encounter.     Procedures: Trigger Point Inj Date/Time: 04/20/2016 10:48 AM Performed by: Pete Pelt Authorized by: Pete Pelt   Consent Given by:  Patient Site marked: the procedure site was marked   Indications:  Muscle spasm Total # of Trigger Points:  1 Location: lower extremity   Needle Size:  25 G Approach:  Medial Medications #1:  0.5 mL lidocaine 1 %; 40 mg methylPREDNISolone acetate 40 MG/ML Patient tolerance:  Patient tolerated the procedure well with no immediate complications Comments: Pes anserinus      Clinical Data: No additional findings.   Subjective: Chief Complaint  Patient presents with  . Left Leg - Pain    Patient returns today with left knee pain. States she has been having left knee pain for several months. She is status post a left TKA. She reports that she has not been able to walk and exercise without significant pain.     Review of Systems Denies fevers chills shortness breath calf pain chest pain  Objective: Vital Signs: There were no vitals taken for this visit.  Physical Exam  Constitutional: She is oriented to person,  place, and time. She appears well-developed and well-nourished. No distress.  Neurological: She is alert and oriented to person, place, and time.    Ortho Exam Bilateral knees excellent range of motion of both knees. No abnormal warmth erythema surgical incisions are well-healed. She has tenderness over the left pes anserinus area of the knee only. Specialty Comments:  No specialty comments available.  Imaging: Xr Knee 1-2 Views Left  Result Date: 04/20/2016 AP lateral views left knee: Acute fractures. Well-seated left total knee arthroplasty without any signs of hardware failure.    PMFS History: Patient Active Problem List   Diagnosis Date Noted  . Unilateral primary osteoarthritis, right knee 12/13/2015  . Hx of total knee arthroplasty 12/13/2015  . Osteoarthritis of left knee 08/02/2015  . 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 Spinal stenosis of lumbar region 06/19/2015  . Chronic pain syndrome 10/30/2014   Past Medical History:  Diagnosis Date  . Allergic rhinitis   . Arthritis   . Asthma    problems with fumes and aerosols cause asthma  . Complication of anesthesia    awakens during surgery; has occurred with last 3-4 surgeries   . Environmental allergies    fumes   . Fibromyalgia   . GERD (gastroesophageal reflux disease)   . History  of bronchitis   . Hypertension   . Numbness    hands bilat when driving; improves when not preforming task   . Status post gastric banding   . Tinnitus    comes and goes   . Vertigo    6 months ago approx     Family History  Problem Relation Age of Onset  . Hypertension Father   . Deep vein thrombosis Father   . Dementia Mother   . Diabetes Sister   . Congestive Heart Failure Sister   . Congestive Heart Failure Brother   . Congestive Heart Failure Daughter   . Congestive Heart Failure Son   . Breast cancer Maternal Aunt     great aunt    Past Surgical History:  Procedure Laterality Date  . ABDOMINAL  HYSTERECTOMY  1979   left ovary remains  . ABDOMINAL HYSTERECTOMY    . CHOLECYSTECTOMY    . fibrous tissue removed from right shoulder and back of neck      2 years ago   . GANGLION CYST EXCISION    . GASTRIC BYPASS  1980   states had bypass with banding and band left in -  . KNEE SURGERY Bilateral    both knees  2001  . TONSILLECTOMY     age 46  . TOTAL KNEE ARTHROPLASTY Left 08/02/2015   Procedure: LEFT TOTAL KNEE ARTHROPLASTY;  Surgeon: Mcarthur Rossetti, MD;  Location: [facility];  Service: Orthopedics;  Laterality: Left;  . TOTAL KNEE ARTHROPLASTY Right 12/13/2015   Procedure: RIGHT TOTAL KNEE ARTHROPLASTY;  Surgeon: Mcarthur Rossetti, MD;  Location: [facility];  Service: Orthopedics;  Laterality: Right;   Social History   Occupational History  . Not on file.   Social History Main Topics  . Smoking status: Former Smoker    Packs/day: 0.25    Years: 50.00  . Smokeless tobacco: Former Systems developer    Types: Snuff, Sarina Ser    Quit date: 01/13/1995     Comment: pt is determined to quit, she has cut down her cigarettes by 2/3 what she was smoking  . Alcohol use No  . Drug use: No  . Sexual activity: Not on file

## 2016-04-27 ENCOUNTER — Telehealth: Payer: Self-pay | Admitting: Anesthesiology

## 2016-04-27 NOTE — Telephone Encounter (Signed)
Patient states she is still waiting on med prior auth from this office. She saw Dr. Ninfa Linden and he wrote a script for her. She is going to fill and will talk with [place] at her appt on Friday

## 2016-04-27 NOTE — Telephone Encounter (Signed)
Can discuss at Friday appointment.

## 2016-05-05 NOTE — ED Provider Notes (Signed)
[facility]    CSN: [id number] Arrival date & time: 01/24/16  1056     History   Chief Complaint Chief Complaint  Patient presents with  . Chest Pain  . Shortness of Breath    HPI Wendy Sanchez is a 66 y.o. female.   66 yo female with a c/o shortness of breath, wheezing, cough, chest pains for the past 2-3 days. Patient has a h/o asthma and her usual inhaler medication has only provided minimal relief. Denies any fevers, chills, vomiting, diarrhea.   The history is provided by the patient.  Chest Pain  Associated symptoms: shortness of breath   Shortness of Breath  Associated symptoms: chest pain     Past Medical History:  Diagnosis Date  . Allergic rhinitis   . Arthritis   . Asthma    problems with fumes and aerosols cause asthma  . Complication of anesthesia    awakens during surgery; has occurred with last 3-4 surgeries   . Environmental allergies    fumes   . Fibromyalgia   . GERD (gastroesophageal reflux disease)   . History of bronchitis   . Hypertension   . Numbness    hands bilat when driving; improves when not preforming task   . Status post gastric banding   . Tinnitus    comes and goes   . Vertigo    6 months ago approx     Patient Active Problem List   Diagnosis Date Noted  . Unilateral primary osteoarthritis, right knee 12/13/2015  . Hx of total knee arthroplasty 12/13/2015  . Osteoarthritis of left knee 08/02/2015  . 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 Spinal stenosis of lumbar region 06/19/2015  . Chronic pain syndrome 10/30/2014    Past Surgical History:  Procedure Laterality Date  . ABDOMINAL HYSTERECTOMY  1979   left ovary remains  . ABDOMINAL HYSTERECTOMY    . CHOLECYSTECTOMY    . fibrous tissue removed from right shoulder and back of neck      2 years ago   . GANGLION CYST EXCISION    . GASTRIC BYPASS  1980   states had bypass with banding and band left in -  . KNEE SURGERY Bilateral    both  knees  2001  . TONSILLECTOMY     age 38  . TOTAL KNEE ARTHROPLASTY Left 08/02/2015   Procedure: LEFT TOTAL KNEE ARTHROPLASTY;  Surgeon: Mcarthur Rossetti, MD;  Location: [facility];  Service: Orthopedics;  Laterality: Left;  . TOTAL KNEE ARTHROPLASTY Right 12/13/2015   Procedure: RIGHT TOTAL KNEE ARTHROPLASTY;  Surgeon: Mcarthur Rossetti, MD;  Location: [facility];  Service: Orthopedics;  Laterality: Right;    OB History    No data available       Home Medications    Prior to Admission medications   Medication Sig Start Date End Date Taking? Authorizing Provider  azelastine (ASTELIN) 0.1 % nasal spray Place 1 spray into both nostrils 2 (two) times daily as needed for allergies. Use in each nostril as directed    Historical Provider, MD  clotrimazole (LOTRIMIN) 1 % cream Apply 1 application topically 2 (two) times daily as needed (rash).     Historical Provider, MD  cyclobenzaprine (FLEXERIL) 10 MG tablet TAKE ONE TABLET BY MOUTH THREE TIMES DAILY AS NEEDED FOR MUSCLE SPASM 03/31/16   Mcarthur Rossetti, MD  dexlansoprazole (DEXILANT) 60 MG capsule Take 60 mg by mouth daily.    Historical Provider, MD  diclofenac sodium (VOLTAREN) 1 % GEL Apply 2 g topically 4 (four) times daily.     Historical Provider, MD  docusate sodium (COLACE) 100 MG capsule Take 200 mg by mouth 2 (two) times daily.    Historical Provider, MD  Fluticasone-Salmeterol (ADVAIR) 250-50 MCG/DOSE AEPB Inhale 1 puff into the lungs 2 (two) times daily.    Historical Provider, MD  folic acid (FOLVITE) 1 MG tablet Take 1 mg by mouth daily.    Historical Provider, MD  furosemide (LASIX) 40 MG tablet Take 40 mg by mouth daily as needed (high blood pressure).     Historical Provider, MD  gabapentin (NEURONTIN) 300 MG capsule TAKE TWO CAPSULES BY MOUTH THREE TIMES DAILY 04/06/16   Molli Barrows, MD  ibuprofen (ADVIL,MOTRIN) 800 MG tablet Take 1 tablet (800 mg total) by mouth 3 (three) times daily. 04/06/16   Molli Barrows, MD    levocetirizine (XYZAL) 5 MG tablet Take 5 mg by mouth every evening.    Historical Provider, MD  mirabegron ER (MYRBETRIQ) 50 MG TB24 tablet Take 50 mg by mouth daily.    Historical Provider, MD  montelukast (SINGULAIR) 10 MG tablet Take 10 mg by mouth at bedtime.    Historical Provider, MD  Multiple Vitamins-Minerals (ICAPS AREDS 2) CAPS Take 1 capsule by mouth 2 (two) times daily.     Historical Provider, MD  Multiple Vitamins-Minerals (MULTIVITAMIN GUMMIES ADULTS) CHEW Chew 2 tablets by mouth daily.     Historical Provider, MD  nortriptyline (PAMELOR) 50 MG capsule Take 50 mg by mouth at bedtime.    Historical Provider, MD  omega-3 acid ethyl esters (LOVAZA) 1 g capsule Take 1 g by mouth 2 (two) times a week. Monday and thursdays    Historical Provider, MD  oxyCODONE-acetaminophen (PERCOCET) 10-325 MG tablet Take 1-2 tablets by mouth every 6 (six) hours as needed for pain. 03/19/16   Molli Barrows, MD  oxyCODONE-acetaminophen (PERCOCET) 10-325 MG tablet Take 1-2 tablets by mouth every 6 (six) hours as needed for pain. 03/19/16   Molli Barrows, MD  potassium chloride SA (K-DUR,KLOR-CON) 20 MEQ tablet Take 20 mEq by mouth 3 (three) times a week. Monday, Wednesday, and Saturday    Historical Provider, MD  Probiotic Product (PROBIOTIC COLON SUPPORT) CAPS Take 1 capsule by mouth at bedtime.    Historical Provider, MD  ramipril (ALTACE) 10 MG capsule Take 10 mg by mouth as needed.     Historical Provider, MD  sucralfate (CARAFATE) 1 G tablet Take 1 g by mouth 2 (two) times daily as needed (esophageal burning).     Historical Provider, MD  triamterene-hydrochlorothiazide (MAXZIDE) 75-50 MG tablet Take 1 tablet by mouth daily as needed (edema).     Historical Provider, MD  Vitamin D, Ergocalciferol, (DRISDOL) 50000 UNITS CAPS capsule Take 50,000 Units by mouth every 7 (seven) days. thursdays    Historical Provider, MD    Family History Family History  Problem Relation Age of Onset  . Hypertension Father    . Deep vein thrombosis Father   . Dementia Mother   . Diabetes Sister   . Congestive Heart Failure Sister   . Congestive Heart Failure Brother   . Congestive Heart Failure Daughter   . Congestive Heart Failure Son   . Breast cancer Maternal Aunt     great aunt    Social History Social History  Substance Use Topics  . Smoking status: Former Smoker    Packs/day: 0.25    Years: 50.00  .  Smokeless tobacco: Former Systems developer    Types: Snuff, Sarina Ser    Quit date: 01/13/1995     Comment: pt is determined to quit, she has cut down her cigarettes by 2/3 what she was smoking  . Alcohol use No     Allergies   Iodine; Other; and Tape   Review of Systems Review of Systems  Respiratory: Positive for shortness of breath.   Cardiovascular: Positive for chest pain.     Physical Exam Triage Vital Signs ED Triage Vitals  Enc Vitals Group     BP 01/24/16 1104 (S) (!) 177/136     Pulse Rate 01/24/16 1104 94     Resp 01/24/16 1104 20     Temp 01/24/16 1104 99 F (37.2 C)     Temp Source 01/24/16 1104 Axillary     SpO2 01/24/16 1104 100 %     Weight 01/24/16 1103 (!) 314 lb (142.4 kg)     Height 01/24/16 1103 5\' 8"  (1.727 m)     Head Circumference --      Peak Flow --      Pain Score 01/24/16 1104 8     Pain Loc --      Pain Edu? --      Excl. in [place]? --    No data found.   Updated Vital Signs BP 133/74 (BP Location: Right Arm)   Pulse 60   Temp 99 F (37.2 C) (Axillary)   Resp 16   Ht 5\' 8"  (1.727 m)   Wt (!) 314 lb (142.4 kg)   SpO2 100%   BMI 47.74 kg/m   Visual Acuity Right Eye Distance:   Left Eye Distance:   Bilateral Distance:    Right Eye Near:   Left Eye Near:    Bilateral Near:     Physical Exam  Constitutional: She is oriented to person, place, and time. She appears well-developed and well-nourished. No distress.  HENT:  Head: Normocephalic.  Right Ear: Tympanic membrane, external ear and ear canal normal.  Left Ear: Tympanic membrane, external ear and  ear canal normal.  Nose: Nose normal.  Mouth/Throat: Oropharynx is clear and moist and mucous membranes are normal.  Eyes: Conjunctivae and EOM are normal. Pupils are equal, round, and reactive to light. Right eye exhibits no discharge. Left eye exhibits no discharge. No scleral icterus.  Neck: Normal range of motion. Neck supple. No JVD present. No tracheal deviation present. No thyromegaly present.  Cardiovascular: Normal rate, regular rhythm, normal heart sounds and intact distal pulses.   No murmur heard. Pulmonary/Chest: Effort normal. No stridor. No respiratory distress. She has wheezes (diffusely ). She has no rales. She exhibits no tenderness.  Abdominal: She exhibits no distension.  Musculoskeletal: She exhibits no edema or tenderness.  Lymphadenopathy:    She has no cervical adenopathy.  Neurological: She is alert and oriented to person, place, and time. She has normal reflexes.  Skin: Skin is warm and dry. No rash noted. She is not diaphoretic. No erythema. No pallor.  Psychiatric: She has a normal mood and affect.  Vitals reviewed.    UC Treatments / Results  Labs (all labs ordered are listed, but only abnormal results are displayed) Labs Reviewed - No data to display  EKG  EKG Interpretation None       Radiology No results found.  Procedures ED EKG Date/Time: 05/05/2016 1:35 PM Performed by: Norval Gable Authorized by: Norval Gable   ECG reviewed by ED Physician in the absence  of a cardiologist: yes   Previous ECG:    Previous ECG:  Unavailable Interpretation:    Interpretation: normal   Rate:    ECG rate assessment: normal   Rhythm:    Rhythm: sinus rhythm and sinus tachycardia   Ectopy:    Ectopy: PVCs     PVCs:  Infrequent QRS:    QRS axis:  Normal   QRS intervals:  Normal Conduction:    Conduction: normal   ST segments:    ST segments:  Normal T waves:    T waves: normal     (including critical care time)  Medications Ordered in  UC Medications  cloNIDine (CATAPRES) tablet 0.1 mg (0.1 mg Oral Given 01/24/16 1131)  ipratropium-albuterol (DUONEB) 0.5-2.5 (3) MG/3ML nebulizer solution 3 mL (3 mLs Nebulization Given 01/24/16 1135)     Initial Impression / Assessment and Plan / UC Course  I have reviewed the triage vital signs and the nursing notes.  Pertinent labs & imaging results that were available during my care of the patient were reviewed by me and considered in my medical decision making (see chart for details).       Final Clinical Impressions(s) / UC Diagnoses   Final diagnoses:  Exacerbation of intermittent asthma, unspecified asthma severity  Chest pain on breathing    New Prescriptions Discharge Medication List as of 01/24/2016 12:48 PM    START taking these medications   Details  albuterol (PROVENTIL) (2.5 MG/3ML) 0.083% nebulizer solution Take 3 mLs (2.5 mg total) by nebulization every 6 (six) hours as needed for wheezing or shortness of breath., Starting Fri 01/24/2016, Normal    doxycycline (VIBRA-TABS) 100 MG tablet Take 1 tablet (100 mg total) by mouth 2 (two) times daily., Starting Fri 01/24/2016, Normal    predniSONE (DELTASONE) 20 MG tablet 3 tabs po qd for 2 days, then 2 tabs po qd for 3 days, then 1 tab po qd for 3 days, then 1 tab po qd for 2 days, Normal       1. x-ray results and diagnosis reviewed with patient 2. Patient given duoneb x 1 and clonidine 0.1 mg po x1 with improvement of symptoms/signs 3.  rx as per orders above; reviewed possible side effects, interactions, risks and benefits  3. Recommend supportive treatment with rest, increased fluids 4. Follow-up prn if symptoms worsen or don't improve   Norval Gable, MD 05/05/16 1339

## 2016-05-18 ENCOUNTER — Other Ambulatory Visit: Payer: Self-pay

## 2016-05-18 ENCOUNTER — Encounter (INDEPENDENT_AMBULATORY_CARE_PROVIDER_SITE_OTHER): Payer: Self-pay | Admitting: Physician Assistant

## 2016-05-18 ENCOUNTER — Ambulatory Visit (INDEPENDENT_AMBULATORY_CARE_PROVIDER_SITE_OTHER): Payer: Medicare HMO | Admitting: Physician Assistant

## 2016-05-18 ENCOUNTER — Ambulatory Visit: Payer: Medicare HMO | Attending: Anesthesiology | Admitting: Anesthesiology

## 2016-05-18 ENCOUNTER — Other Ambulatory Visit: Payer: Self-pay | Admitting: Respiratory Therapy

## 2016-05-18 ENCOUNTER — Ambulatory Visit
Admission: RE | Admit: 2016-05-18 | Discharge: 2016-05-18 | Disposition: A | Discharge status: Home / Self Care | Payer: Medicare HMO | Source: Ambulatory Visit | Attending: Anesthesiology | Admitting: Anesthesiology

## 2016-05-18 VITALS — BP 130/41 | HR 56 | Temp 98.5°F | Resp 16 | Ht 68.0 in | Wt 308.0 lb

## 2016-05-18 DIAGNOSIS — Z96652 Presence of left artificial knee joint: Secondary | ICD-10-CM

## 2016-05-18 DIAGNOSIS — M5387 Other specified dorsopathies, lumbosacral region: Secondary | ICD-10-CM

## 2016-05-18 DIAGNOSIS — G894 Chronic pain syndrome: Secondary | ICD-10-CM | POA: Insufficient documentation

## 2016-05-18 DIAGNOSIS — M539 Dorsopathy, unspecified: Secondary | ICD-10-CM | POA: Diagnosis not present

## 2016-05-18 DIAGNOSIS — M7052 Other bursitis of knee, left knee: Secondary | ICD-10-CM | POA: Diagnosis not present

## 2016-05-18 DIAGNOSIS — Z96651 Presence of right artificial knee joint: Secondary | ICD-10-CM | POA: Diagnosis not present

## 2016-05-18 DIAGNOSIS — M5136 Other intervertebral disc degeneration, lumbar region: Secondary | ICD-10-CM

## 2016-05-18 DIAGNOSIS — M48061 Spinal stenosis, lumbar region without neurogenic claudication: Secondary | ICD-10-CM | POA: Diagnosis not present

## 2016-05-18 DIAGNOSIS — I959 Hypotension, unspecified: Secondary | ICD-10-CM

## 2016-05-18 DIAGNOSIS — M1712 Unilateral primary osteoarthritis, left knee: Secondary | ICD-10-CM

## 2016-05-18 DIAGNOSIS — I499 Cardiac arrhythmia, unspecified: Secondary | ICD-10-CM | POA: Diagnosis present

## 2016-05-18 HISTORY — DX: Other bursitis of knee, left knee: M70.52

## 2016-05-18 HISTORY — DX: Presence of right artificial knee joint: Z96.651

## 2016-05-18 MED ORDER — OXYCODONE-ACETAMINOPHEN 7.5-325 MG PO TABS: 1.0000 | ORAL_TABLET | Freq: Four times a day (QID) | ORAL | 0 refills | Status: DC | PRN

## 2016-05-18 MED ORDER — IBUPROFEN 800 MG PO TABS: 800.0000 mg | ORAL_TABLET | Freq: Three times a day (TID) | ORAL | 5 refills | Status: DC

## 2016-05-18 MED ORDER — GABAPENTIN 300 MG PO CAPS: 600.0000 mg | ORAL_CAPSULE | Freq: Three times a day (TID) | ORAL | 1 refills | Status: DC

## 2016-05-18 MED ORDER — CYCLOBENZAPRINE HCL 10 MG PO TABS: 10.0000 mg | ORAL_TABLET | Freq: Three times a day (TID) | ORAL | 1 refills | Status: DC | PRN

## 2016-05-18 NOTE — Progress Notes (Signed)
Wendy Sanchez returns today follow-up of her left knee status post peds injection for 918. She statesshe has less pain. She is status post right total knee arthroplasty December 17 and left total knee arthroplasty July 2017. She continues to work on strengthening of both of her legs and there is in the pool work gym working out almost daily basis.  Physical exam bilateral knee she has full extension flexion is just limited due to her thigh size. No instability valgus varus stressing. Minimal tenderness over the left pes anserinus.  No erythema or abnormal warmth or effusion of either knee.  Plan: We'll see her back in October at that time we'll obtain AP lateral views of both knees. She is to continue to work on Forensic scientist.

## 2016-05-18 NOTE — Progress Notes (Signed)
Nursing Pain Medication Assessment:  Safety precautions to be maintained throughout the outpatient stay will include: orient to surroundings, keep bed in low position, maintain call bell within reach at all times, provide assistance with transfer out of bed and ambulation.  Medication Inspection Compliance: Pill count conducted under aseptic conditions, in front of the patient. Neither the pills nor the bottle was removed from the patient's sight at any time. Once count was completed pills were immediately returned to the patient in their original bottle.  Medication: Oxycodone/APAP Pill/Patch Count: 33 of 60 pills remain Pill/Patch Appearance: Markings consistent with prescribed medication Bottle Appearance: Standard pharmacy container. Clearly labeled. Filled Date: 05 / 01 / 2018 Last Medication intake:  Today

## 2016-05-19 ENCOUNTER — Telehealth: Payer: Self-pay

## 2016-05-19 NOTE — Telephone Encounter (Signed)
Pt wants Dr Andree Elk and Juliann Pulse to know she is doing okay she was not able to have EKG read but she does have a Dr. Freda Jackson

## 2016-05-19 NOTE — Progress Notes (Signed)
CC:  Diffuse body pain  Procedure:  None  HPI:  Wendy Sanchez was last seen approximately 2 months ago and continues to do well with her regimen. She is taking her medications with minimal side effects and no excessive sleepiness or constipation. The pain that she experiences diffusely throughout her body is improved with this as reviewed on her narcotic assessment sheet and with her today. She has responded well to these medications and historically her pain has been recalcitrant and has not responded to conservative therapy. She still using her Flexeril and ibuprofen as well and doing this at a more conservative dosing schedule. She tries to do her stretching exercises but this is difficult based on her weight and mobility.  .BP (!) 130/41   Pulse (!) 56   Temp 98.5 F (36.9 C) (Oral)   Resp 16   Ht 5\' 8"  (1.727 m)   Wt (!) 308 lb (139.7 kg)   SpO2 100%   BMI 46.83 kg/m    Current Outpatient Prescriptions:  .  azelastine (ASTELIN) 0.1 % nasal spray, Place 1 spray into both nostrils 2 (two) times daily as needed for allergies. Use in each nostril as directed, Disp: , Rfl:  .  clotrimazole (LOTRIMIN) 1 % cream, Apply 1 application topically 2 (two) times daily as needed (rash). , Disp: , Rfl:  .  cyclobenzaprine (FLEXERIL) 10 MG tablet, Take 1 tablet (10 mg total) by mouth 3 (three) times daily as needed. for muscle spams, Disp: 60 tablet, Rfl: 1 .  dexlansoprazole (DEXILANT) 60 MG capsule, Take 60 mg by mouth daily., Disp: , Rfl:  .  diclofenac sodium (VOLTAREN) 1 % GEL, Apply 2 g topically 4 (four) times daily. , Disp: , Rfl:  .  docusate sodium (COLACE) 100 MG capsule, Take 200 mg by mouth 2 (two) times daily., Disp: , Rfl:  .  Fluticasone-Salmeterol (ADVAIR) 250-50 MCG/DOSE AEPB, Inhale 1 puff into the lungs 2 (two) times daily., Disp: , Rfl:  .  folic acid (FOLVITE) 1 MG tablet, Take 1 mg by mouth daily., Disp: , Rfl:  .  furosemide (LASIX) 40 MG tablet, Take 40 mg by mouth daily as needed  (high blood pressure). , Disp: , Rfl:  .  gabapentin (NEURONTIN) 300 MG capsule, Take 2 capsules (600 mg total) by mouth 3 (three) times daily., Disp: 180 capsule, Rfl: 1 .  ibuprofen (ADVIL,MOTRIN) 800 MG tablet, Take 1 tablet (800 mg total) by mouth 3 (three) times daily., Disp: 90 tablet, Rfl: 5 .  levocetirizine (XYZAL) 5 MG tablet, Take 5 mg by mouth every evening., Disp: , Rfl:  .  mirabegron ER (MYRBETRIQ) 50 MG TB24 tablet, Take 50 mg by mouth daily., Disp: , Rfl:  .  montelukast (SINGULAIR) 10 MG tablet, Take 10 mg by mouth at bedtime., Disp: , Rfl:  .  Multiple Vitamins-Minerals (ICAPS AREDS 2) CAPS, Take 1 capsule by mouth 2 (two) times daily. , Disp: , Rfl:  .  Multiple Vitamins-Minerals (MULTIVITAMIN GUMMIES ADULTS) CHEW, Chew 2 tablets by mouth daily. , Disp: , Rfl:  .  nortriptyline (PAMELOR) 50 MG capsule, Take 50 mg by mouth at bedtime., Disp: , Rfl:  .  omega-3 acid ethyl esters (LOVAZA) 1 g capsule, Take 1 g by mouth 2 (two) times a week. Monday and thursdays, Disp: , Rfl:  .  potassium chloride SA (K-DUR,KLOR-CON) 20 MEQ tablet, Take 20 mEq by mouth 3 (three) times a week. Monday, Wednesday, and Saturday, Disp: , Rfl:  .  Probiotic  Product (PROBIOTIC COLON SUPPORT) CAPS, Take 1 capsule by mouth at bedtime., Disp: , Rfl:  .  ramipril (ALTACE) 10 MG capsule, Take 10 mg by mouth as needed. , Disp: , Rfl:  .  sucralfate (CARAFATE) 1 G tablet, Take 1 g by mouth 2 (two) times daily as needed (esophageal burning). , Disp: , Rfl:  .  triamterene-hydrochlorothiazide (MAXZIDE) 75-50 MG tablet, Take 1 tablet by mouth daily as needed (edema). , Disp: , Rfl:  .  Vitamin D, Ergocalciferol, (DRISDOL) 50000 UNITS CAPS capsule, Take 50,000 Units by mouth every 7 (seven) days. thursdays, Disp: , Rfl:  .  oxyCODONE-acetaminophen (PERCOCET) 7.5-325 MG tablet, Take 1 tablet by mouth every 6 (six) hours as needed for severe pain., Disp: 120 tablet, Rfl: 0  Patient Active Problem List   Diagnosis  Date Noted  . Presence of right artificial knee joint 05/18/2016  . Pes anserinus bursitis of left knee 05/18/2016  . Unilateral primary osteoarthritis, right knee 12/13/2015  . Hx of total knee arthroplasty 12/13/2015  . Osteoarthritis of left knee 08/02/2015  . 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 Spinal stenosis of lumbar region 06/19/2015  . Chronic pain syndrome 10/30/2014  Past medical history significant for essential hypertension  Hyperlipidemia  Asthma Seasonal allergies Hemorrhoids  history of migraines  and obesity  generalized abdominal pain Osteoarthritis  Chickenpox  Cigarette smoking  Past surgeries include abdominal hysterectomy with partial vaginectomy  Gastric bypass 7  Rotator cuff repair  Left wrist surgery  Laparoscopy with gastroplasty and vertical banding for obesity       Allergies  Allergen Reactions  . Iodine Anaphylaxis and Swelling  . Other     ALLERGY TO METAL - BLACKENS SKIN AND CAUSES A RASH   . Tape Swelling    Skin comes off    family history significant for colon polyps hepatitis C and lupus   Review of systems: Cardiac: No angina GI: Occasional constipation well controlled Physical exam:    PERRL, EOMI  Alert and Ox3  H:  RRR   Lungs:  CTA  Her lower extremity musculature remains as per previous examination and she walks with an antalgic gait. I Assessment:  1. 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  Sleep apnea   5.  Chronic narcotic usage for generalized pain   6.  Evidence of multi prescriber opioid prescription  Plan:    1. We have gone over the importance of compliance with the clinic policies regarding opioids once again. There is no evidence of any concern today. We have instructed her that should she receive  any opioids for any type of perioperative pain that we be alerted to this..   2 We talked about her need to proceed with efforts at continued weight loss and continue with physical  therapy exercises and continue follow-up with her primary care.   3. We will defer on any repeat injections at this time however would consider an L5-S1 epidural for her back pain if this worsens. Refills were given today for 7.5 mg tablets qid dosing with return to clinic in 2 months.  Dr. Vashti Hey MD 2:55 PM

## 2016-05-21 ENCOUNTER — Telehealth: Payer: Self-pay | Admitting: *Deleted

## 2016-05-21 NOTE — Telephone Encounter (Signed)
Patient called in to get results of EKG that was ordered on 05/18/16.  She was some heaviness in her chest.  Also reports having seen Dr Kary Kos on yesterday from whom a halter monitor was ordered and results were pending.    Per report from Cardiology the results did not flow over to their muse system and were unable to be viewed.  HOwever there was a Financial controller, from cardio, was going to have Dr Arta Bruce read the EKG asap.  Dr Andree Elk came through and s/s that Mrs Karges reported, SOB, heaviness in chest and rapid heartbeat were reported and it was advised by Dr Andree Elk that she go to the ED for further evaluation. Mrs velma agnes u/o information.

## 2016-05-25 ENCOUNTER — Telehealth: Payer: Self-pay | Admitting: Anesthesiology

## 2016-05-25 LAB — TOXASSURE SELECT 13 (MW), URINE

## 2016-05-25 NOTE — Telephone Encounter (Signed)
Spoke with pharmacy to see if they had a key for PA to go through cover my meds.  Pharmacy doesn't see any problem with filling last Rx.  However, the letters that Wendy Sanchez dropped of are informing us that the plan will not cover the meds after the next 30 days.  Pharmacy states that in fact it would be better to wait until there is a denial for drugs to get the PA which will then be sent to our office by the pharmacy. [employer] forms will be placed in PA book.

## 2016-05-25 NOTE — Telephone Encounter (Signed)
Patient left prior auth papers for her meds, the pharmacy says she cannot get meds until they have prior auth please call patient when you get auth.

## 2016-07-16 ENCOUNTER — Encounter: Payer: Self-pay | Admitting: Anesthesiology

## 2016-07-16 ENCOUNTER — Ambulatory Visit: Payer: Medicare Other | Attending: Anesthesiology | Admitting: Anesthesiology

## 2016-07-16 VITALS — BP 126/84 | HR 93 | Temp 98.1°F | Resp 16 | Ht 68.0 in | Wt 308.0 lb

## 2016-07-16 DIAGNOSIS — M1712 Unilateral primary osteoarthritis, left knee: Secondary | ICD-10-CM | POA: Diagnosis not present

## 2016-07-16 DIAGNOSIS — M48061 Spinal stenosis, lumbar region without neurogenic claudication: Secondary | ICD-10-CM | POA: Diagnosis not present

## 2016-07-16 DIAGNOSIS — M5387 Other specified dorsopathies, lumbosacral region: Secondary | ICD-10-CM

## 2016-07-16 DIAGNOSIS — M5136 Other intervertebral disc degeneration, lumbar region: Secondary | ICD-10-CM | POA: Diagnosis not present

## 2016-07-16 DIAGNOSIS — G894 Chronic pain syndrome: Secondary | ICD-10-CM | POA: Insufficient documentation

## 2016-07-16 DIAGNOSIS — M539 Dorsopathy, unspecified: Secondary | ICD-10-CM | POA: Insufficient documentation

## 2016-07-16 MED ORDER — OXYCODONE-ACETAMINOPHEN 7.5-325 MG PO TABS: 1.0000 | ORAL_TABLET | Freq: Four times a day (QID) | ORAL | 0 refills | Status: DC | PRN

## 2016-07-16 MED ORDER — GABAPENTIN 300 MG PO CAPS: 600.0000 mg | ORAL_CAPSULE | Freq: Three times a day (TID) | ORAL | 1 refills | Status: DC

## 2016-07-16 MED ORDER — OXYCODONE-ACETAMINOPHEN 7.5-325 MG PO TABS
1.0000 | ORAL_TABLET | Freq: Four times a day (QID) | ORAL | 0 refills | Status: DC | PRN
Start: 2016-07-16 — End: 2016-08-23

## 2016-07-16 MED ORDER — CYCLOBENZAPRINE HCL 10 MG PO TABS: 10.0000 mg | ORAL_TABLET | Freq: Two times a day (BID) | ORAL | 3 refills | Status: DC

## 2016-07-16 NOTE — Progress Notes (Signed)
Nursing Pain Medication Assessment:  Safety precautions to be maintained throughout the outpatient stay will include: orient to surroundings, keep bed in low position, maintain call bell within reach at all times, provide assistance with transfer out of bed and ambulation.  Medication Inspection Compliance: Pill count conducted under aseptic conditions, in front of the patient. Neither the pills nor the bottle was removed from the patient's sight at any time. Once count was completed pills were immediately returned to the patient in their original bottle.  Medication: Oxycodone/APAP Pill/Patch Count: 12 of 120 pills remain Pill/Patch Appearance: Markings consistent with prescribed medication Bottle Appearance: Standard pharmacy container. Clearly labeled. Filled Date: 06 / 12 / 2018 Last Medication intake:  Today

## 2016-07-16 NOTE — Progress Notes (Signed)
CC:  Diffuse body pain  Procedure:  None  HPI:  Wendy Sanchez was last seen 2 months ago and her pain in the low back is been doing reasonably well. She's taking her oxycodone and Percocet form as prescribed 4 times a day. No problems with the medication are reported today. Based on her narcotic assessment sheet she continues to derive good lifestyle improvement and benefit. Otherwise the quality of the pain is been stable in nature with no changes in lower extremity strength or function. She has tried efforts at weight loss but has been permanently unsuccessful.  She was seen in the ER recently following her last visit secondary to some blood pressure issues and she's followed up with Dr. Clayborn Bigness secondary to some PVCs and an inferior MI present on her EKG. His evaluation reveals a 30% ejection fraction with a history of congestive heart failure. At present her energy level has been reasonable and she has no baseline shortness of breath.  .BP 126/84 (BP Location: Right Arm, Patient Position: Sitting, Cuff Size: Large)   Pulse 93   Temp 98.1 F (36.7 C) (Oral)   Resp 16   Ht 5\' 8"  (1.727 m)   Wt (!) 308 lb (139.7 kg)   SpO2 100%   BMI 46.83 kg/m    Current Outpatient Prescriptions:  .  azelastine (ASTELIN) 0.1 % nasal spray, Place 1 spray into both nostrils 2 (two) times daily as needed for allergies. Use in each nostril as directed, Disp: , Rfl:  .  clotrimazole (LOTRIMIN) 1 % cream, Apply 1 application topically 2 (two) times daily as needed (rash). , Disp: , Rfl:  .  cyclobenzaprine (FLEXERIL) 10 MG tablet, Take 1 tablet (10 mg total) by mouth 2 (two) times daily. for muscle spams, Disp: 60 tablet, Rfl: 3 .  dexlansoprazole (DEXILANT) 60 MG capsule, Take 60 mg by mouth daily., Disp: , Rfl:  .  diclofenac sodium (VOLTAREN) 1 % GEL, Apply 2 g topically 4 (four) times daily. , Disp: , Rfl:  .  docusate sodium (COLACE) 100 MG capsule, Take 200 mg by mouth 2 (two) times daily., Disp: , Rfl:  .   Fluticasone-Salmeterol (ADVAIR) 250-50 MCG/DOSE AEPB, Inhale 1 puff into the lungs 2 (two) times daily., Disp: , Rfl:  .  folic acid (FOLVITE) 1 MG tablet, Take 1 mg by mouth daily., Disp: , Rfl:  .  furosemide (LASIX) 40 MG tablet, Take 20 mg by mouth daily as needed (high blood pressure). , Disp: , Rfl:  .  gabapentin (NEURONTIN) 300 MG capsule, Take 2 capsules (600 mg total) by mouth 3 (three) times daily., Disp: 180 capsule, Rfl: 1 .  ibuprofen (ADVIL,MOTRIN) 800 MG tablet, Take 1 tablet (800 mg total) by mouth 3 (three) times daily., Disp: 90 tablet, Rfl: 5 .  levocetirizine (XYZAL) 5 MG tablet, Take 5 mg by mouth every evening., Disp: , Rfl:  .  mirabegron ER (MYRBETRIQ) 50 MG TB24 tablet, Take 50 mg by mouth daily., Disp: , Rfl:  .  montelukast (SINGULAIR) 10 MG tablet, Take 10 mg by mouth at bedtime., Disp: , Rfl:  .  Multiple Vitamins-Minerals (ICAPS AREDS 2) CAPS, Take 1 capsule by mouth 2 (two) times daily. , Disp: , Rfl:  .  Multiple Vitamins-Minerals (MULTIVITAMIN GUMMIES ADULTS) CHEW, Chew 2 tablets by mouth daily. , Disp: , Rfl:  .  nortriptyline (PAMELOR) 50 MG capsule, Take 50 mg by mouth at bedtime., Disp: , Rfl:  .  omega-3 acid ethyl esters (LOVAZA) 1  g capsule, Take 1 g by mouth 2 (two) times a week. Monday and thursdays, Disp: , Rfl:  .  oxyCODONE-acetaminophen (PERCOCET) 7.5-325 MG tablet, Take 1 tablet by mouth every 6 (six) hours as needed for severe pain., Disp: 120 tablet, Rfl: 0 .  potassium chloride SA (K-DUR,KLOR-CON) 20 MEQ tablet, Take 20 mEq by mouth 3 (three) times a week. Monday, Wednesday, and Saturday, Disp: , Rfl:  .  Probiotic Product (PROBIOTIC COLON SUPPORT) CAPS, Take 1 capsule by mouth at bedtime., Disp: , Rfl:  .  ramipril (ALTACE) 10 MG capsule, Take 10 mg by mouth as needed. , Disp: , Rfl:  .  sucralfate (CARAFATE) 1 G tablet, Take 1 g by mouth 2 (two) times daily as needed (esophageal burning). , Disp: , Rfl:  .  triamterene-hydrochlorothiazide  (MAXZIDE) 75-50 MG tablet, Take 1 tablet by mouth daily as needed (edema). , Disp: , Rfl:  .  Vitamin D, Ergocalciferol, (DRISDOL) 50000 UNITS CAPS capsule, Take 50,000 Units by mouth every 7 (seven) days. thursdays, Disp: , Rfl:   Patient Active Problem List   Diagnosis Date Noted  . Presence of right artificial knee joint 05/18/2016  . Pes anserinus bursitis of left knee 05/18/2016  . Unilateral primary osteoarthritis, right knee 12/13/2015  . Hx of total knee arthroplasty 12/13/2015  . Osteoarthritis of left knee 08/02/2015  . 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 Spinal stenosis of lumbar region 06/19/2015  . Chronic pain syndrome 10/30/2014  Past medical history significant for essential hypertension Congestive heart failure with depressed ejection fraction at 30% and previous inferior MI  Hyperlipidemia  Asthma Seasonal allergies Hemorrhoids  history of migraines  and obesity  generalized abdominal pain Osteoarthritis  Chickenpox  Cigarette smoking  Past surgeries include abdominal hysterectomy with partial vaginectomy  Gastric bypass 7  Rotator cuff repair  Left wrist surgery  Laparoscopy with gastroplasty and vertical banding for obesity       Allergies  Allergen Reactions  . Iodine Anaphylaxis and Swelling  . Other     ALLERGY TO METAL - BLACKENS SKIN AND CAUSES A RASH   . Tape Swelling    Skin comes off    family history significant for colon polyps hepatitis C and lupus   Review of systems: Cardiac: No anginaWith history of dyspnea on exertion Pulmonary: No shortness of breath GI: Occasional constipation well controlled Physical exam:    PERRL, EOMI  Alert and Ox3  H:  RRR   Lungs:  CTA  Her lower extremity musculature remains as per previous examination and she walks with an antalgic gait. With no significant paraspinous muscle tenderness noted. I Assessment:  1. Diffuse body pain  2  Osteoarthritis of a  Diffuse  Nature  3  facet arthropathy with generalized degenerative disc disease And spinal stenosis with degenerative disc disease and bilateral L5-S1 radiculitis  4.  Sleep apnea   5.  Chronic narcotic usage for generalized pain   6.  Evidence of multi prescriber opioid prescription since resolved.  7. History of PVCs and inferior MI on EKG with EF at 30% followed by Dr. Clayborn Bigness  Plan:    1 she is doing well with her current pain medication regimen. No untoward side effects are noted and she is to continue with this medication with refills given for the next 2 months with return to clinic in 2 months. Continue back stretching and strengthening exercises as tolerated and efforts at weight loss.   2 We talked  about her need to proceed with efforts at continued weight loss and continue with physical  therapy exercises and continue follow-up with her primary care.   3. Continue follow-up with cardiology and primary care physicians.   Dr. Vashti Hey MD 2:13 PM

## 2016-08-12 ENCOUNTER — Encounter
Admission: RE | Disposition: A | Discharge status: Home / Self Care | Payer: Self-pay | Source: Ambulatory Visit | Attending: Internal Medicine

## 2016-08-12 ENCOUNTER — Encounter: Payer: Self-pay | Admitting: Internal Medicine

## 2016-08-12 ENCOUNTER — Ambulatory Visit
Admission: RE | Admit: 2016-08-12 | Discharge: 2016-08-12 | Disposition: A | Discharge status: Home / Self Care | Payer: Medicare Other | Source: Ambulatory Visit | Attending: Internal Medicine | Admitting: Internal Medicine

## 2016-08-12 DIAGNOSIS — Z79899 Other long term (current) drug therapy: Secondary | ICD-10-CM | POA: Insufficient documentation

## 2016-08-12 DIAGNOSIS — I10 Essential (primary) hypertension: Secondary | ICD-10-CM | POA: Insufficient documentation

## 2016-08-12 DIAGNOSIS — I429 Cardiomyopathy, unspecified: Secondary | ICD-10-CM | POA: Diagnosis present

## 2016-08-12 DIAGNOSIS — Z7951 Long term (current) use of inhaled steroids: Secondary | ICD-10-CM | POA: Diagnosis not present

## 2016-08-12 DIAGNOSIS — I272 Pulmonary hypertension, unspecified: Secondary | ICD-10-CM | POA: Diagnosis not present

## 2016-08-12 DIAGNOSIS — Z7952 Long term (current) use of systemic steroids: Secondary | ICD-10-CM | POA: Insufficient documentation

## 2016-08-12 HISTORY — PX: RIGHT/LEFT HEART CATH AND CORONARY ANGIOGRAPHY: CATH118266

## 2016-08-12 SURGERY — RIGHT/LEFT HEART CATH AND CORONARY ANGIOGRAPHY
Anesthesia: Moderate Sedation | Laterality: Bilateral

## 2016-08-12 SURGERY — RIGHT/LEFT HEART CATH AND CORONARY ANGIOGRAPHY
Anesthesia: Moderate Sedation

## 2016-08-12 MED ORDER — SODIUM CHLORIDE 0.9% FLUSH: 3.0000 mL | Freq: Two times a day (BID) | INTRAVENOUS | Status: DC

## 2016-08-12 MED ORDER — MIDAZOLAM HCL 2 MG/2ML IJ SOLN
INTRAMUSCULAR | Status: AC
  Filled 2016-08-12: qty 2

## 2016-08-12 MED ORDER — SODIUM CHLORIDE 0.9 % WEIGHT BASED INFUSION: 1.0000 mL/kg/h | INTRAVENOUS | Status: DC

## 2016-08-12 MED ORDER — ASPIRIN 81 MG PO CHEW
81.0000 mg | CHEWABLE_TABLET | ORAL | Status: AC
  Administered 2016-08-12: 81 mg via ORAL

## 2016-08-12 MED ORDER — METHYLPREDNISOLONE SODIUM SUCC 125 MG IJ SOLR: 125.0000 mg | Freq: Once | INTRAMUSCULAR | Status: DC

## 2016-08-12 MED ORDER — HEPARIN (PORCINE) IN NACL 2-0.9 UNIT/ML-% IJ SOLN
INTRAMUSCULAR | Status: AC
  Filled 2016-08-12: qty 500

## 2016-08-12 MED ORDER — SODIUM CHLORIDE 0.9 % IV SOLN: 250.0000 mL | INTRAVENOUS | Status: DC | PRN

## 2016-08-12 MED ORDER — ASPIRIN 81 MG PO CHEW
CHEWABLE_TABLET | ORAL | Status: AC
  Filled 2016-08-12: qty 1

## 2016-08-12 MED ORDER — SODIUM CHLORIDE 0.9% FLUSH: 3.0000 mL | INTRAVENOUS | Status: DC | PRN

## 2016-08-12 MED ORDER — IOPAMIDOL (ISOVUE-300) INJECTION 61%
INTRAVENOUS | Status: DC | PRN
  Administered 2016-08-12: 70 mL via INTRA_ARTERIAL

## 2016-08-12 MED ORDER — FENTANYL CITRATE (PF) 100 MCG/2ML IJ SOLN
INTRAMUSCULAR | Status: AC
  Filled 2016-08-12: qty 2

## 2016-08-12 MED ORDER — ACETAMINOPHEN 325 MG PO TABS: 650.0000 mg | ORAL_TABLET | ORAL | Status: DC | PRN

## 2016-08-12 MED ORDER — SODIUM CHLORIDE 0.9 % WEIGHT BASED INFUSION
3.0000 mL/kg/h | INTRAVENOUS | Status: DC
  Administered 2016-08-12: 3 mL/kg/h via INTRAVENOUS

## 2016-08-12 MED ORDER — ONDANSETRON HCL 4 MG/2ML IJ SOLN
4.0000 mg | Freq: Four times a day (QID) | INTRAMUSCULAR | Status: DC | PRN
Start: 2016-08-12 — End: 2016-08-12

## 2016-08-12 SURGICAL SUPPLY — 13 items
CATH 5FR JR4 DIAGNOSTIC (CATHETERS) ×3 IMPLANT
CATH INFINITI 5FR ANG PIGTAIL (CATHETERS) ×3 IMPLANT
CATH INFINITI 5FR JL4 (CATHETERS) ×3 IMPLANT
CATH SWANZ 7F THERMO (CATHETERS) ×3 IMPLANT
DEVICE CLOSURE MYNXGRIP 5F (Vascular Products) ×3 IMPLANT
GUIDEWIRE EMER 3M J .025X150CM (WIRE) ×3 IMPLANT
KIT MANI 3VAL PERCEP (MISCELLANEOUS) ×3 IMPLANT
KIT RIGHT HEART (MISCELLANEOUS) ×6 IMPLANT
NEEDLE PERC 18GX7CM (NEEDLE) ×3 IMPLANT
PACK CARDIAC CATH (CUSTOM PROCEDURE TRAY) ×3 IMPLANT
SHEATH AVANTI 5FR X 11CM (SHEATH) ×3 IMPLANT
SHEATH PINNACLE 7F 10CM (SHEATH) ×3 IMPLANT
WIRE EMERALD 3MM-J .035X150CM (WIRE) ×3 IMPLANT

## 2016-08-12 NOTE — Discharge Instructions (Signed)
Moderate Conscious Sedation, Adult °Sedation is the use of medicines to promote relaxation and relieve discomfort and anxiety. Moderate conscious sedation is a type of sedation. Under moderate conscious sedation, you are less alert than normal, but you are still able to respond to instructions, touch, or both. °Moderate conscious sedation is used during short medical and dental procedures. It is milder than deep sedation, which is a type of sedation under which you cannot be easily woken up. It is also milder than general anesthesia, which is the use of medicines to make you unconscious. Moderate conscious sedation allows you to return to your regular activities sooner. °Tell a health care provider about: °· Any allergies you have. °· All medicines you are taking, including vitamins, herbs, eye drops, creams, and over-the-counter medicines. °· Use of steroids (by mouth or creams). °· Any problems you or family members have had with sedatives and anesthetic medicines. °· Any blood disorders you have. °· Any surgeries you have had. °· Any medical conditions you have, such as sleep apnea. °· Whether you are pregnant or may be pregnant. °· Any use of cigarettes, alcohol, marijuana, or street drugs. °What are the risks? °Generally, this is a safe procedure. However, problems may occur, including: °· Getting too much medicine (oversedation). °· Nausea. °· Allergic reaction to medicines. °· Trouble breathing. If this happens, a breathing tube may be used to help with breathing. It will be removed when you are awake and breathing on your own. °· Heart trouble. °· Lung trouble. ° °What happens before the procedure? °Staying hydrated °Follow instructions from your health care provider about hydration, which may include: °· Up to 2 hours before the procedure - you may continue to drink clear liquids, such as water, clear fruit juice, black coffee, and plain tea. ° °Eating and drinking restrictions °Follow instructions from  your health care provider about eating and drinking, which may include: °· 8 hours before the procedure - stop eating heavy meals or foods such as meat, fried foods, or fatty foods. °· 6 hours before the procedure - stop eating light meals or foods, such as toast or cereal. °· 6 hours before the procedure - stop drinking milk or drinks that contain milk. °· 2 hours before the procedure - stop drinking clear liquids. ° °Medicine ° °Ask your health care provider about: °· Changing or stopping your regular medicines. This is especially important if you are taking diabetes medicines or blood thinners. °· Taking medicines such as aspirin and ibuprofen. These medicines can thin your blood. Do not take these medicines before your procedure if your health care provider instructs you not to. ° °Tests and exams °· You will have a physical exam. °· You may have blood tests done to show: °? How well your kidneys and liver are working. °? How well your blood can clot. °General instructions °· Plan to have someone take you home from the hospital or clinic. °· If you will be going home right after the procedure, plan to have someone with you for 24 hours. °What happens during the procedure? °· An IV tube will be inserted into one of your veins. °· Medicine to help you relax (sedative) will be given through the IV tube. °· The medical or dental procedure will be performed. °What happens after the procedure? °· Your blood pressure, heart rate, breathing rate, and blood oxygen level will be monitored often until the medicines you were given have worn off. °· Do not drive for 24 hours. °  This information is not intended to replace advice given to you by your health care provider. Make sure you discuss any questions you have with your health care provider. °Document Released: 09/23/2000 Document Revised: 06/04/2015 Document Reviewed: 04/20/2015 °Elsevier Interactive Patient Education © 2018 [employer] °Angiogram, Care After °This  sheet gives you information about how to care for yourself after your procedure. Your health care provider may also give you more specific instructions. If you have problems or questions, contact your health care provider. °What can I expect after the procedure? °After the procedure, it is common to have bruising and tenderness at the catheter insertion area. °Follow these instructions at home: °Insertion site care °· Follow instructions from your health care provider about how to take care of your insertion site. Make sure you: °? Wash your hands with soap and water before you change your bandage (dressing). If soap and water are not available, use hand sanitizer. °? Change your dressing as told by your health care provider. °? Leave stitches (sutures), skin glue, or adhesive strips in place. These skin closures may need to stay in place for 2 weeks or longer. If adhesive strip edges start to loosen and curl up, you may trim the loose edges. Do not remove adhesive strips completely unless your health care provider tells you to do that. °· Do not take baths, swim, or use a hot tub until your health care provider approves. °· You may shower 24-48 hours after the procedure or as told by your health care provider. °? Gently wash the site with plain soap and water. °? Pat the area dry with a clean towel. °? Do not rub the site. This may cause bleeding. °· Do not apply powder or lotion to the site. Keep the site clean and dry. °· Check your insertion site every day for signs of infection. Check for: °? Redness, swelling, or pain. °? Fluid or blood. °? Warmth. °? Pus or a bad smell. °Activity °· Rest as told by your health care provider, usually for 1-2 days. °· Do not lift anything that is heavier than 10 lbs. (4.5 kg) or as told by your health care provider. °· Do not drive for 24 hours if you were given a medicine to help you relax (sedative). °· Do not drive or use heavy machinery while taking prescription pain  medicine. °General instructions °· Return to your normal activities as told by your health care provider, usually in about a week. Ask your health care provider what activities are safe for you. °· If the catheter site starts bleeding, lie flat and put pressure on the site. If the bleeding does not stop, get help right away. This is a medical emergency. °· Drink enough fluid to keep your urine clear or pale yellow. This helps flush the contrast dye from your body. °· Take over-the-counter and prescription medicines only as told by your health care provider. °· Keep all follow-up visits as told by your health care provider. This is important. °Contact a health care provider if: °· You have a fever or chills. °· You have redness, swelling, or pain around your insertion site. °· You have fluid or blood coming from your insertion site. °· The insertion site feels warm to the touch. °· You have pus or a bad smell coming from your insertion site. °· You have bruising around the insertion site. °· You notice blood collecting in the tissue around the catheter site (hematoma). The hematoma may be painful to   the touch. °Get help right away if: °· You have severe pain at the catheter insertion area. °· The catheter insertion area swells very fast. °· The catheter insertion area is bleeding, and the bleeding does not stop when you hold steady pressure on the area. °· The area near or just beyond the catheter insertion site becomes pale, cool, tingly, or numb. °These symptoms may represent a serious problem that is an emergency. Do not wait to see if the symptoms will go away. Get medical help right away. Call your local emergency services (911 in the [place] Do not drive yourself to the hospital. °Summary °· After the procedure, it is common to have bruising and tenderness at the catheter insertion area. °· After the procedure, it is important to rest and drink plenty of fluids. °· Do not take baths, swim, or use a hot tub until  your health care provider says it is okay to do so. You may shower 24-48 hours after the procedure or as told by your health care provider. °· If the catheter site starts bleeding, lie flat and put pressure on the site. If the bleeding does not stop, get help right away. This is a medical emergency. °This information is not intended to replace advice given to you by your health care provider. Make sure you discuss any questions you have with your health care provider. °Document Released: 07/17/2004 Document Revised: 12/04/2015 Document Reviewed: 12/04/2015 °Elsevier Interactive Patient Education © 2017 [employer] ° °

## 2016-08-19 ENCOUNTER — Other Ambulatory Visit: Payer: Self-pay | Admitting: Internal Medicine

## 2016-08-19 DIAGNOSIS — I729 Aneurysm of unspecified site: Secondary | ICD-10-CM

## 2016-08-20 ENCOUNTER — Encounter: Payer: Self-pay | Admitting: *Deleted

## 2016-08-20 ENCOUNTER — Encounter: Payer: Medicare Other | Attending: Internal Medicine | Admitting: *Deleted

## 2016-08-20 VITALS — Ht 67.4 in | Wt 318.9 lb

## 2016-08-20 DIAGNOSIS — I5022 Chronic systolic (congestive) heart failure: Secondary | ICD-10-CM | POA: Insufficient documentation

## 2016-08-20 NOTE — Progress Notes (Signed)
Daily Session Note  Patient Details  Name: Wendy Sanchez MRN: [id number] Date of Birth: [birth year] Referring Provider:     Cardiac Rehab from 08/20/2016 in [facility]  Referring Provider  Lorrene Reid MD      Encounter Date: 08/20/2016  Check In:     Session Check In - 08/20/16 1335      Check-In   Location [facility]-Cardiac & Pulmonary Rehab   Staff Present Heath Lark, RN, BSN, Gordy Councilman, MA, ACSM RCEP, Exercise Physiologist;Meredith Sherryll Burger, RN BSN   Supervising physician immediately available to respond to emergencies See telemetry face sheet for immediately available ER MD   Medication changes reported     No   Fall or balance concerns reported    No   Warm-up and Cool-down Performed as group-led instruction   Resistance Training Performed Yes   VAD Patient? No     Pain Assessment   Currently in Pain? No/denies           Exercise Prescription Changes - 08/20/16 1300      Response to Exercise   Blood Pressure (Admit) 124/70   Blood Pressure (Exercise) 128/74   Blood Pressure (Exit) 116/66   Heart Rate (Admit) 91 bpm   Heart Rate (Exercise) 121 bpm   Heart Rate (Exit) 101 bpm   Oxygen Saturation (Admit) 100 %   Oxygen Saturation (Exercise) 98 %   Rating of Perceived Exertion (Exercise) 17   Symptoms SOB, right sided chest pain, leg pain 8/10   Comments walk test results      History  Smoking Status  . Current Every Day Smoker  . Packs/day: 0.25  . Years: 50.00  Smokeless Tobacco  . Former Systems developer  . Types: Snuff, Chew  . Quit date: 01/13/1995    Comment: Planned quit date August 28, 2016    Goals Met:  Exercise tolerated well Personal goals reviewed No report of cardiac concerns or symptoms Strength training completed today  Goals Unmet:  Not Applicable  Comments: medical review completed, intial ITP created    Dr. Emily Filbert is Medical Director for [place] and [facility].

## 2016-08-20 NOTE — Patient Instructions (Addendum)
Patient Instructions  Patient Details  Name: Wendy Sanchez MRN: [id number] Date of Birth: 01-16-1950 Referring Provider:  Yolonda Kida, MD  Below are the personal goals you chose as well as exercise and nutrition goals. Our goal is to help you keep on track towards obtaining and maintaining your goals. We will be discussing your progress on these goals with you throughout the program.  Initial Exercise Prescription:     Initial Exercise Prescription - 08/20/16 1400      Date of Initial Exercise RX and Referring Provider   Date 08/20/16   Referring Provider Lorrene Reid MD     Treadmill   MPH 1.5   Grade 0   Minutes 15   METs 2.15     Recumbant Elliptical   Level 1   RPM 50   Minutes 15   METs 2     T5 Nustep   Level 1   SPM 80   Minutes 15   METs 2     Prescription Details   Frequency (times per week) 3   Duration Progress to 45 minutes of aerobic exercise without signs/symptoms of physical distress     Intensity   THRR 40-80% of Max Heartrate 117-142   Ratings of Perceived Exertion 11-13   Perceived Dyspnea 0-4     Progression   Progression Continue to progress workloads to maintain intensity without signs/symptoms of physical distress.     Resistance Training   Training Prescription Yes   Weight 2 lbs   Reps 10-15      Exercise Goals: Frequency: Be able to perform aerobic exercise three times per week working toward 3-5 days per week.  Intensity: Work with a perceived exertion of 11 (fairly light) - 15 (hard) as tolerated. Follow your new exercise prescription and watch for changes in prescription as you progress with the program. Changes will be reviewed with you when they are made.  Duration: You should be able to do 30 minutes of continuous aerobic exercise in addition to a 5 minute warm-up and a 5 minute cool-down routine.  Nutrition Goals: Your personal nutrition goals will be established when you do your nutrition analysis with the  dietician.  The following are nutrition guidelines to follow: Cholesterol < 200mg /day Sodium < 1500mg /day Fiber: Women over 50 yrs - 21 grams per day  Personal Goals:     Personal Goals and Risk Factors at Admission - 08/20/16 1412      Core Components/Risk Factors/Patient Goals on Admission    Weight Management Yes;Obesity;Weight Loss   Admit Weight 317 lb 11.2 oz (144.1 kg)   Goal Weight: Short Term 315 lb (142.9 kg)   Goal Weight: Long Term 250 lb (113.4 kg)   Expected Outcomes Short Term: Continue to assess and modify interventions until short term weight is achieved;Long Term: Adherence to nutrition and physical activity/exercise program aimed toward attainment of established weight goal;Weight Loss: Understanding of general recommendations for a balanced deficit meal plan, which promotes 1-2 lb weight loss per week and includes a negative energy balance of [phone] kcal/d;Understanding of distribution of calorie intake throughout the day with the consumption of 4-5 meals/snacks;Understanding recommendations for meals to include 15-35% energy as protein, 25-35% energy from fat, 35-60% energy from carbohydrates, less than 200mg  of dietary cholesterol, 20-35 gm of total fiber daily   Tobacco Cessation Yes   Number of packs per day is smoking about 8 a day   correction SB   Intervention Assist the participant in steps  to quit. Provide individualized education and counseling about committing to Tobacco Cessation, relapse prevention, and pharmacological support that can be provided by physician.;Advice worker, assist with locating and accessing local/national Quit Smoking programs, and support quit date choice.   Expected Outcomes Short Term: Will demonstrate readiness to quit, by selecting a quit date.;Short Term: Will quit all tobacco product use, adhering to prevention of relapse plan.;Long Term: Complete abstinence from all tobacco products for at least 12 months from quit  date.   Improve shortness of breath with ADL's Yes   Intervention Provide education, individualized exercise plan and daily activity instruction to help decrease symptoms of SOB with activities of daily living.   Expected Outcomes Short Term: Achieves a reduction of symptoms when performing activities of daily living.   Increase knowledge of respiratory medications and ability to use respiratory devices properly  Yes   Intervention Provide education and demonstration as needed of appropriate use of medications, inhalers, and oxygen therapy.   Expected Outcomes Short Term: Achieves understanding of medications use. Understands that oxygen is a medication prescribed by physician. Demonstrates appropriate use of inhaler and oxygen therapy.   Heart Failure Yes   Intervention Provide a combined exercise and nutrition program that is supplemented with education, support and counseling about heart failure. Directed toward relieving symptoms such as shortness of breath, decreased exercise tolerance, and extremity edema.   Expected Outcomes Improve functional capacity of life;Short term: Attendance in program 2-3 days a week with increased exercise capacity. Reported lower sodium intake. Reported increased fruit and vegetable intake. Reports medication compliance.;Short term: Daily weights obtained and reported for increase. Utilizing diuretic protocols set by physician.;Long term: Adoption of self-care skills and reduction of barriers for early signs and symptoms recognition and intervention leading to self-care maintenance.   Hypertension Yes   Intervention Provide education on lifestyle modifcations including regular physical activity/exercise, weight management, moderate sodium restriction and increased consumption of fresh fruit, vegetables, and low fat dairy, alcohol moderation, and smoking cessation.;Monitor prescription use compliance.   Expected Outcomes Short Term: Continued assessment and intervention  until BP is < 140/14mm HG in hypertensive participants. < 130/26mm HG in hypertensive participants with diabetes, heart failure or chronic kidney disease.;Long Term: Maintenance of blood pressure at goal levels.   Lipids Yes   Intervention Provide education and support for participant on nutrition & aerobic/resistive exercise along with prescribed medications to achieve LDL 70mg , HDL >40mg .   Expected Outcomes Short Term: Participant states understanding of desired cholesterol values and is compliant with medications prescribed. Participant is following exercise prescription and nutrition guidelines.;Long Term: Cholesterol controlled with medications as prescribed, with individualized exercise RX and with personalized nutrition plan. Value goals: LDL < 70mg , HDL > 40 mg.      Tobacco Use Initial Evaluation: History  Smoking Status  . Current Every Day Smoker  . Packs/day: 0.25  . Years: 50.00  Smokeless Tobacco  . Former Systems developer  . Types: Snuff, Chew  . Quit date: 01/13/1995    Comment: Planned quit date August 28, 2016    Copy of goals given to participant.

## 2016-08-20 NOTE — Progress Notes (Signed)
Cardiac Individual Treatment Plan  Patient Details  Name: Wendy Sanchez MRN: [id number] Date of Birth: [birth year] Referring Provider:     Cardiac Rehab from 08/20/2016 in [facility] Cardiac and Pulmonary Rehab  Referring Provider  Lorrene Reid MD      Initial Encounter Date:    Cardiac Rehab from 08/20/2016 in [facility] Cardiac and Pulmonary Rehab  Date  08/20/16  Referring Provider  Lorrene Reid MD      Visit Diagnosis: Heart failure, chronic systolic ([place])  Patient's Home Medications on Admission:  Current Outpatient Prescriptions:  .  albuterol (PROVENTIL HFA;VENTOLIN HFA) 108 (90 Base) MCG/ACT inhaler, Inhale 2 puffs into the lungs every 6 (six) hours as needed for wheezing or shortness of breath., Disp: , Rfl:  .  carvedilol (COREG) 3.125 MG tablet, Take by mouth., Disp: , Rfl:  .  clotrimazole (LOTRIMIN) 1 % cream, Apply 1 application topically 2 (two) times daily as needed (rash). , Disp: , Rfl:  .  cyclobenzaprine (FLEXERIL) 10 MG tablet, Take 1 tablet (10 mg total) by mouth 2 (two) times daily. for muscle spams (Patient taking differently: Take 10 mg by mouth 3 (three) times daily. for muscle spams), Disp: 60 tablet, Rfl: 3 .  dexlansoprazole (DEXILANT) 60 MG capsule, Take 60 mg by mouth daily., Disp: , Rfl:  .  diclofenac sodium (VOLTAREN) 1 % GEL, Apply 2 g topically 4 (four) times daily as needed (pain). , Disp: , Rfl:  .  docusate sodium (COLACE) 100 MG capsule, Take 200 mg by mouth at bedtime. , Disp: , Rfl:  .  fluticasone (FLONASE) 50 MCG/ACT nasal spray, Place 1 spray into both nostrils daily., Disp: , Rfl:  .  Fluticasone-Salmeterol (ADVAIR) 250-50 MCG/DOSE AEPB, Inhale 1 puff into the lungs 2 (two) times daily., Disp: , Rfl:  .  folic acid (FOLVITE) 1 MG tablet, Take 1 mg by mouth at bedtime. , Disp: , Rfl:  .  furosemide (LASIX) 40 MG tablet, Take 20 mg by mouth daily as needed for fluid or edema. , Disp: , Rfl:  .  gabapentin (NEURONTIN) 300 MG capsule, Take 2  capsules (600 mg total) by mouth 3 (three) times daily. (Patient taking differently: Take 600 mg by mouth 2 (two) times daily. May take an additional 600mg  as needed for pain), Disp: 180 capsule, Rfl: 1 .  ibuprofen (ADVIL,MOTRIN) 800 MG tablet, Take 1 tablet (800 mg total) by mouth 3 (three) times daily., Disp: 90 tablet, Rfl: 5 .  levocetirizine (XYZAL) 5 MG tablet, Take 5 mg by mouth at bedtime. , Disp: , Rfl:  .  mirabegron ER (MYRBETRIQ) 50 MG TB24 tablet, Take 50 mg by mouth daily., Disp: , Rfl:  .  montelukast (SINGULAIR) 10 MG tablet, Take 10 mg by mouth at bedtime., Disp: , Rfl:  .  Multiple Vitamins-Minerals (ICAPS AREDS 2) CAPS, Take 1 capsule by mouth 2 (two) times daily. , Disp: , Rfl:  .  Multiple Vitamins-Minerals (MULTIVITAMIN GUMMIES ADULTS) CHEW, Chew 2 tablets by mouth daily. , Disp: , Rfl:  .  nortriptyline (PAMELOR) 50 MG capsule, Take 50 mg by mouth at bedtime., Disp: , Rfl:  .  Omega-3 Fatty Acids (FISH OIL) 1000 MG CAPS, Take 1000mg  every 3rd day, Disp: , Rfl:  .  oxyCODONE-acetaminophen (PERCOCET) 7.5-325 MG tablet, Take 1 tablet by mouth every 6 (six) hours as needed for severe pain., Disp: 120 tablet, Rfl: 0 .  potassium chloride SA (K-DUR,KLOR-CON) 20 MEQ tablet, Take 20 meq every third day, Disp: ,  Rfl:  .  Probiotic Product (PROBIOTIC COLON SUPPORT) CAPS, Take 1 capsule by mouth at bedtime., Disp: , Rfl:  .  ramipril (ALTACE) 10 MG capsule, Take 10 mg by mouth daily as needed (high blood pressure). , Disp: , Rfl:  .  sucralfate (CARAFATE) 1 G tablet, Take 1 g by mouth 2 (two) times daily as needed (esophageal burning). , Disp: , Rfl:  .  triamterene-hydrochlorothiazide (MAXZIDE) 75-50 MG tablet, Take 1 tablet by mouth daily as needed (severe edema). , Disp: , Rfl:  .  Vitamin D, Ergocalciferol, (DRISDOL) 50000 UNITS CAPS capsule, Take 50,000 Units by mouth every 7 (seven) days. thursdays, Disp: , Rfl:  .  diphenhydrAMINE (BENADRYL) 25 MG tablet, Take 50 mg by mouth at  bedtime as needed for sleep. Also take 50 mgs by mouth every 4 hours pre-procedural med instruction, Disp: , Rfl:  .  predniSONE (DELTASONE) 20 MG tablet, Take 60 mg by mouth every 4 (four) hours., Disp: , Rfl:  .  ranitidine (ZANTAC) 150 MG tablet, Take 150 mg by mouth every 4 (four) hours., Disp: , Rfl:   Past Medical History: Past Medical History:  Diagnosis Date  . Allergic rhinitis   . Arthritis   . Asthma    problems with fumes and aerosols cause asthma  . Complication of anesthesia    awakens during surgery; has occurred with last 3-4 surgeries   . Environmental allergies    fumes   . Fibromyalgia   . GERD (gastroesophageal reflux disease)   . History of bronchitis   . Hypertension   . Numbness    hands bilat when driving; improves when not preforming task   . Status post gastric banding   . Tinnitus    comes and goes   . Vertigo    6 months ago approx     Tobacco Use: History  Smoking Status  . Current Every Day Smoker  . Packs/day: 0.25  . Years: 50.00  Smokeless Tobacco  . Former Systems developer  . Types: Snuff, Chew  . Quit date: 01/13/1995    Comment: Planned quit date August 28, 2016    Labs: Recent Review Scientist, physiological    Labs for ITP Cardiac and Pulmonary Rehab 03/16/2007   Cholestrol 236 ATP III CLASSIFICATION: <200     mg/dL   Desirable 200-239  mg/dL   Borderline High >=240    mg/dL   High(H)   LDLCALC 150 Total Cholesterol/HDL:CHD Risk Coronary Heart Disease Risk Table Men   Women 1/2 Average Risk   3.4   3.3(H)   HDL 71   Trlycerides 76       Exercise Target Goals: Date: 08/20/16  Exercise Program Goal: Individual exercise prescription set with THRR, safety & activity barriers. Participant demonstrates ability to understand and report RPE using BORG scale, to self-measure pulse accurately, and to acknowledge the importance of the exercise prescription.  Exercise Prescription Goal: Starting with aerobic activity 30 plus minutes a day, 3 days per  week for initial exercise prescription. Provide home exercise prescription and guidelines that participant acknowledges understanding prior to discharge.  Activity Barriers & Risk Stratification:     Activity Barriers & Cardiac Risk Stratification - 08/20/16 1410      Activity Barriers & Cardiac Risk Stratification   Activity Barriers Arthritis;Left Knee Replacement;Right Knee Replacement;Shortness of Breath;Muscular Weakness;Deconditioning;Joint Problems;Fibromyalgia  Knee replacements were done in 2017.  Right shoulder rotator cuff repair, removal of bone spurs and other repair done. Has ganglion on left wrist.  Arthritis Right ankle   Cardiac Risk Stratification High      6 Minute Walk:     6 Minute Walk    Row Name 08/20/16 1440         6 Minute Walk   Phase Initial     Distance 830 feet     Walk Time 6 minutes     # of Rest Breaks 1  1:02     MPH 1.9     METS 1.22     RPE 17     Perceived Dyspnea  4     VO2 Peak 4.29     Symptoms Yes (comment)     Comments SOB, right sided chest pain, leg pain 8/10     Resting HR 91 bpm     Resting BP 124/70     Max Ex. HR 118 bpm     Max Ex. BP 128/74     2 Minute Post BP 116/66        Oxygen Initial Assessment:   Oxygen Re-Evaluation:   Oxygen Discharge (Final Oxygen Re-Evaluation):   Initial Exercise Prescription:     Initial Exercise Prescription - 08/20/16 1400      Date of Initial Exercise RX and Referring Provider   Date 08/20/16   Referring Provider Lorrene Reid MD     Treadmill   MPH 1.5   Grade 0   Minutes 15   METs 2.15     Recumbant Elliptical   Level 1   RPM 50   Minutes 15   METs 2     T5 Nustep   Level 1   SPM 80   Minutes 15   METs 2     Prescription Details   Frequency (times per week) 3   Duration Progress to 45 minutes of aerobic exercise without signs/symptoms of physical distress     Intensity   THRR 40-80% of Max Heartrate 117-142   Ratings of Perceived Exertion 11-13    Perceived Dyspnea 0-4     Progression   Progression Continue to progress workloads to maintain intensity without signs/symptoms of physical distress.     Resistance Training   Training Prescription Yes   Weight 2 lbs   Reps 10-15      Perform Capillary Blood Glucose checks as needed.  Exercise Prescription Changes:     Exercise Prescription Changes    Row Name 08/20/16 1300             Response to Exercise   Blood Pressure (Admit) 124/70       Blood Pressure (Exercise) 128/74       Blood Pressure (Exit) 116/66       Heart Rate (Admit) 91 bpm       Heart Rate (Exercise) 121 bpm       Heart Rate (Exit) 101 bpm       Oxygen Saturation (Admit) 100 %       Oxygen Saturation (Exercise) 98 %       Rating of Perceived Exertion (Exercise) 17       Symptoms SOB, right sided chest pain, leg pain 8/10       Comments walk test results          Exercise Comments:   Exercise Goals and Review:     Exercise Goals    Row Name 08/20/16 1444             Exercise Goals   Increase Physical Activity Yes  Intervention Provide advice, education, support and counseling about physical activity/exercise needs.;Develop an individualized exercise prescription for aerobic and resistive training based on initial evaluation findings, risk stratification, comorbidities and participant's personal goals.       Expected Outcomes Achievement of increased cardiorespiratory fitness and enhanced flexibility, muscular endurance and strength shown through measurements of functional capacity and personal statement of participant.       Increase Strength and Stamina Yes       Intervention Provide advice, education, support and counseling about physical activity/exercise needs.;Develop an individualized exercise prescription for aerobic and resistive training based on initial evaluation findings, risk stratification, comorbidities and participant's personal goals.       Expected Outcomes  Achievement of increased cardiorespiratory fitness and enhanced flexibility, muscular endurance and strength shown through measurements of functional capacity and personal statement of participant.          Exercise Goals Re-Evaluation :   Discharge Exercise Prescription (Final Exercise Prescription Changes):     Exercise Prescription Changes - 08/20/16 1300      Response to Exercise   Blood Pressure (Admit) 124/70   Blood Pressure (Exercise) 128/74   Blood Pressure (Exit) 116/66   Heart Rate (Admit) 91 bpm   Heart Rate (Exercise) 121 bpm   Heart Rate (Exit) 101 bpm   Oxygen Saturation (Admit) 100 %   Oxygen Saturation (Exercise) 98 %   Rating of Perceived Exertion (Exercise) 17   Symptoms SOB, right sided chest pain, leg pain 8/10   Comments walk test results      Nutrition:  Target Goals: Understanding of nutrition guidelines, daily intake of sodium 1500mg , cholesterol 200mg , calories 30% from fat and 7% or less from saturated fats, daily to have 5 or more servings of fruits and vegetables.  Biometrics:     Pre Biometrics - 08/20/16 1444      Pre Biometrics   Height 5' 7.4" (1.712 m)   Weight (!)  318 lb 14.4 oz (144.7 kg)   Waist Circumference 44.5 inches   Hip Circumference 65 inches   Waist to Hip Ratio 0.68 %   BMI (Calculated) 49.5   Single Leg Stand 1.73 seconds       Nutrition Therapy Plan and Nutrition Goals:     Nutrition Therapy & Goals - 08/20/16 1346      Intervention Plan   Intervention Prescribe, educate and counsel regarding individualized specific dietary modifications aiming towards targeted core components such as weight, hypertension, lipid management, diabetes, heart failure and other comorbidities.   Expected Outcomes Short Term Goal: Understand basic principles of dietary content, such as calories, fat, sodium, cholesterol and nutrients.;Short Term Goal: A plan has been developed with personal nutrition goals set during dietitian  appointment.;Long Term Goal: Adherence to prescribed nutrition plan.      Nutrition Discharge: Rate Your Plate Scores:     Nutrition Assessments - 08/20/16 1346      MEDFICTS Scores   Pre Score 35      Nutrition Goals Re-Evaluation:   Nutrition Goals Discharge (Final Nutrition Goals Re-Evaluation):   Psychosocial: Target Goals: Acknowledge presence or absence of significant depression and/or stress, maximize coping skills, provide positive support system. Participant is able to verbalize types and ability to use techniques and skills needed for reducing stress and depression.   Initial Review & Psychosocial Screening:     Initial Psych Review & Screening - 08/20/16 1409      Initial Review   Comments Ziyon is aso stredd about the fact  that her medical team has not been listening to her.  She wants to be heard , evaluated and treated.       Quality of Life Scores:      Quality of Life - 08/20/16 1350      Quality of Life Scores   Health/Function Pre 10.64 %   Socioeconomic Pre 11.44 %   Psych/Spiritual Pre 16.29 %   Family Pre 5.63 %   GLOBAL Pre 10.64 %      PHQ-9: Recent Review Flowsheet Data    Depression screen [facility] 2/9 08/20/2016 07/16/2016 05/18/2016 03/19/2016 01/09/2016   Decreased Interest 0 0 0 0 0   Down, Depressed, Hopeless 0 0 0 0 0   PHQ - 2 Score 0 0 0 0 0   Altered sleeping 3 - - - -   Tired, decreased energy 2 - - - -   Change in appetite 3 - - - -   Feeling bad or failure about yourself  0 - - - -   Trouble concentrating 0 - - - -   Moving slowly or fidgety/restless 0 - - - -   Suicidal thoughts 0 - - - -   PHQ-9 Score 8 - - - -   Difficult doing work/chores Very difficult - - - -     Interpretation of Total Score  Total Score Depression Severity:  1-4 = Minimal depression, 5-9 = Mild depression, 10-14 = Moderate depression, 15-19 = Moderately severe depression, 20-27 = Severe depression   Psychosocial Evaluation and  Intervention:   Psychosocial Re-Evaluation:   Psychosocial Discharge (Final Psychosocial Re-Evaluation):   Vocational Rehabilitation: Provide vocational rehab assistance to qualifying candidates.   Vocational Rehab Evaluation & Intervention:     Vocational Rehab - 08/20/16 1358      Initial Vocational Rehab Evaluation & Intervention   Assessment shows need for Vocational Rehabilitation No      Education: Education Goals: Education classes will be provided on a weekly basis, covering required topics. Participant will state understanding/return demonstration of topics presented.  Learning Barriers/Preferences:     Learning Barriers/Preferences - 08/20/16 1357      Learning Barriers/Preferences   Learning Barriers Sight  Left eye vision at times is blurred   Learning Preferences None      Education Topics: General Nutrition Guidelines/Fats and Fiber: -Group instruction provided by verbal, written material, models and posters to present the general guidelines for heart healthy nutrition. Gives an explanation and review of dietary fats and fiber.   Controlling Sodium/Reading Food Labels: -Group verbal and written material supporting the discussion of sodium use in heart healthy nutrition. Review and explanation with models, verbal and written materials for utilization of the food label.   Exercise Physiology & Risk Factors: - Group verbal and written instruction with models to review the exercise physiology of the cardiovascular system and associated critical values. Details cardiovascular disease risk factors and the goals associated with each risk factor.   Aerobic Exercise & Resistance Training: - Gives group verbal and written discussion on the health impact of inactivity. On the components of aerobic and resistive training programs and the benefits of this training and how to safely progress through these programs.   Flexibility, Balance, General Exercise  Guidelines: - Provides group verbal and written instruction on the benefits of flexibility and balance training programs. Provides general exercise guidelines with specific guidelines to those with heart or lung disease. Demonstration and skill practice provided.   Stress Management: - Provides group  verbal and written instruction about the health risks of elevated stress, cause of high stress, and healthy ways to reduce stress.   Depression: - Provides group verbal and written instruction on the correlation between heart/lung disease and depressed mood, treatment options, and the stigmas associated with seeking treatment.   Anatomy & Physiology of the Heart: - Group verbal and written instruction and models provide basic cardiac anatomy and physiology, with the coronary electrical and arterial systems. Review of: AMI, Angina, Valve disease, Heart Failure, Cardiac Arrhythmia, Pacemakers, and the ICD.   Cardiac Procedures: - Group verbal and written instruction and models to describe the testing methods done to diagnose heart disease. Reviews the outcomes of the test results. Describes the treatment choices: Medical Management, Angioplasty, or Coronary Bypass Surgery.   Cardiac Medications: - Group verbal and written instruction to review commonly prescribed medications for heart disease. Reviews the medication, class of the drug, and side effects. Includes the steps to properly store meds and maintain the prescription regimen.   Go Sex-Intimacy & Heart Disease, Get SMART - Goal Setting: - Group verbal and written instruction through game format to discuss heart disease and the return to sexual intimacy. Provides group verbal and written material to discuss and apply goal setting through the application of the S.M.A.R.T. Method.   Other Matters of the Heart: - Provides group verbal, written materials and models to describe Heart Failure, Angina, Valve Disease, and Diabetes in the realm of  heart disease. Includes description of the disease process and treatment options available to the cardiac patient.   Exercise & Equipment Safety: - Individual verbal instruction and demonstration of equipment use and safety with use of the equipment.   Cardiac Rehab from 08/20/2016 in [facility] Cardiac and Pulmonary Rehab  Date  08/20/16  Educator  [facility]  Instruction Review Code  2- meets goals/outcomes      Infection Prevention: - Provides verbal and written material to individual with discussion of infection control including proper hand washing and proper equipment cleaning during exercise session.   Cardiac Rehab from 08/20/2016 in [facility] Cardiac and Pulmonary Rehab  Date  08/20/16  Educator  [facility]  Instruction Review Code  2- meets goals/outcomes      Falls Prevention: - Provides verbal and written material to individual with discussion of falls prevention and safety.   Cardiac Rehab from 08/20/2016 in [facility] Cardiac and Pulmonary Rehab  Date  08/20/16  Educator  [facility]  Instruction Review Code  2- meets goals/outcomes      Diabetes: - Individual verbal and written instruction to review signs/symptoms of diabetes, desired ranges of glucose level fasting, after meals and with exercise. Advice that pre and post exercise glucose checks will be done for 3 sessions at entry of program.    Knowledge Questionnaire Score:     Knowledge Questionnaire Score - 08/20/16 1358      Knowledge Questionnaire Score   Pre Score 20/28  Reviewed correct responses with [name] today. She verbalized understanding of the responses.      Core Components/Risk Factors/Patient Goals at Admission:     Personal Goals and Risk Factors at Admission - 08/20/16 1412      Core Components/Risk Factors/Patient Goals on Admission   Number of packs per day is smoking about 8 a day   correction SB      Core Components/Risk Factors/Patient Goals Review:    Core Components/Risk Factors/Patient Goals at Discharge (Final  Review):    ITP Comments:     ITP Comments  [place] Name 08/20/16 1337 08/20/16 1338         ITP Comments Medical review completed today. INitial ITP created for review and changes if required and signature by Dr Sabra Heck.  Diagnosis documentation can be found in [facility] Medical review completed today. INitial ITP created for review and changes if required and signature by Dr Sabra Heck.  Diagnosis documentation can be found in [facility] encounter 08/06/2016         Comments: Initial ITP

## 2016-08-21 ENCOUNTER — Ambulatory Visit
Admission: RE | Admit: 2016-08-21 | Discharge: 2016-08-21 | Disposition: A | Discharge status: Home / Self Care | Payer: Medicare Other | Source: Ambulatory Visit | Attending: Internal Medicine | Admitting: Internal Medicine

## 2016-08-21 DIAGNOSIS — I729 Aneurysm of unspecified site: Secondary | ICD-10-CM | POA: Diagnosis present

## 2016-08-23 ENCOUNTER — Encounter: Payer: Self-pay | Admitting: Gynecology

## 2016-08-23 ENCOUNTER — Ambulatory Visit
Admission: EM | Admit: 2016-08-23 | Discharge: 2016-08-23 | Disposition: A | Discharge status: Home / Self Care | Payer: Medicare Other | Attending: Family Medicine | Admitting: Family Medicine

## 2016-08-23 DIAGNOSIS — R39198 Other difficulties with micturition: Secondary | ICD-10-CM | POA: Diagnosis not present

## 2016-08-23 DIAGNOSIS — R102 Pelvic and perineal pain: Secondary | ICD-10-CM | POA: Diagnosis not present

## 2016-08-23 LAB — URINALYSIS, COMPLETE (UACMP) WITH MICROSCOPIC
BACTERIA UA: NONE SEEN
Bilirubin Urine: NEGATIVE
GLUCOSE, UA: NEGATIVE mg/dL
Ketones, ur: NEGATIVE mg/dL
Leukocytes, UA: NEGATIVE
Nitrite: NEGATIVE
PH: 7 (ref 5.0–8.0)
PROTEIN: NEGATIVE mg/dL
SPECIFIC GRAVITY, URINE: 1.01 (ref 1.005–1.030)

## 2016-08-23 NOTE — ED Provider Notes (Signed)
[facility]    CSN: [id number] Arrival date & time: 08/23/16  2694     History   Chief Complaint Chief Complaint  Patient presents with  . Urinary Tract Infection    HPI  66 year old female with an extensive past medical history including relatively new onset congestive heart failure with an EF of 25-30%, recent DVT, morbid obesity, chronic pain presents with concerns that she has a UTI.  Patient states that for the past 4-5 days she's had a decrease in urine output. She is currently on Lasix and is also on Mirapex her for overactive bladder. She states that she is in taking a lot of water, 6-716 ounce bottles of water a day despite having congestive heart failure. She states that she was not told to limit her fluid intake. She states that she's had some low back pain and some pain in the suprapubic region. No dysuria. No urgency. She came into see if she had a urinary tract infection. No associated fever. No other urinary signs or symptoms. She states that she's had lower extremity edema, particularly the ankles. No other associated symptoms. No other complaints or concerns this time.  Past Medical History:  Diagnosis Date  . Allergic rhinitis   . Arthritis   . Asthma    problems with fumes and aerosols cause asthma  . Complication of anesthesia    awakens during surgery; has occurred with last 3-4 surgeries   . Environmental allergies    fumes   . Fibromyalgia   . GERD (gastroesophageal reflux disease)   . History of bronchitis   . Hypertension   . Numbness    hands bilat when driving; improves when not preforming task   . Status post gastric banding   . Tinnitus    comes and goes   . Vertigo    6 months ago approx     Patient Active Problem List   Diagnosis Date Noted  . Presence of right artificial knee joint 05/18/2016  . Pes anserinus bursitis of left knee 05/18/2016  . Unilateral primary osteoarthritis, right knee 12/13/2015  . Hx of total knee  arthroplasty 12/13/2015  . Osteoarthritis of left knee 08/02/2015  . 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 Morbid obesity with BMI of 45.0-49.9, adult ([place]) 07/25/2015  . Spinal stenosis of lumbar region 06/19/2015  . Chronic pain syndrome 10/30/2014    Past Surgical History:  Procedure Laterality Date  . ABDOMINAL HYSTERECTOMY  1979   left ovary remains  . ABDOMINAL HYSTERECTOMY    . CHOLECYSTECTOMY    . fibrous tissue removed from right shoulder and back of neck      2 years ago   . GANGLION CYST EXCISION    . GASTRIC BYPASS  1980   states had bypass with banding and band left in -  . KNEE SURGERY Bilateral    both knees  2001  . RIGHT/LEFT HEART CATH AND CORONARY ANGIOGRAPHY Bilateral 08/12/2016   Procedure: Right/Left Heart Cath and Coronary Angiography;  Surgeon: Yolonda Kida, MD;  Location: [facility];  Service: Cardiovascular;  Laterality: Bilateral;  . TONSILLECTOMY     age 28  . TOTAL KNEE ARTHROPLASTY Left 08/02/2015   Procedure: LEFT TOTAL KNEE ARTHROPLASTY;  Surgeon: Mcarthur Rossetti, MD;  Location: [facility];  Service: Orthopedics;  Laterality: Left;  . TOTAL KNEE ARTHROPLASTY Right 12/13/2015   Procedure: RIGHT TOTAL KNEE ARTHROPLASTY;  Surgeon: Mcarthur Rossetti, MD;  Location: [facility];  Service: Orthopedics;  Laterality: Right;    OB History    No data available       Home Medications    Prior to Admission medications   Medication Sig Start Date End Date Taking? Authorizing Provider  albuterol (PROVENTIL HFA;VENTOLIN HFA) 108 (90 Base) MCG/ACT inhaler Inhale 2 puffs into the lungs every 6 (six) hours as needed for wheezing or shortness of breath.   Yes [provider]  carvedilol (COREG) 3.125 MG tablet Take by mouth. 08/19/16 08/19/17 Yes [provider]  clotrimazole (LOTRIMIN) 1 % cream Apply 1 application topically 2 (two) times daily as needed (rash).    Yes [provider]  cyclobenzaprine  (FLEXERIL) 10 MG tablet Take 1 tablet (10 mg total) by mouth 2 (two) times daily. for muscle spams Patient taking differently: Take 10 mg by mouth 3 (three) times daily. for muscle spams 07/16/16  Yes Molli Barrows, MD  dexlansoprazole (DEXILANT) 60 MG capsule Take 60 mg by mouth daily.   Yes [provider]  diclofenac sodium (VOLTAREN) 1 % GEL Apply 2 g topically 4 (four) times daily as needed (pain).    Yes [provider]  diphenhydrAMINE (BENADRYL) 25 MG tablet Take 50 mg by mouth at bedtime as needed for sleep. Also take 50 mgs by mouth every 4 hours pre-procedural med instruction   Yes [provider]  docusate sodium (COLACE) 100 MG capsule Take 200 mg by mouth at bedtime.    Yes [provider]  fluticasone (FLONASE) 50 MCG/ACT nasal spray Place 1 spray into both nostrils daily.   Yes [provider]  Fluticasone-Salmeterol (ADVAIR) 250-50 MCG/DOSE AEPB Inhale 1 puff into the lungs 2 (two) times daily.   Yes [provider]  folic acid (FOLVITE) 1 MG tablet Take 1 mg by mouth at bedtime.    Yes [provider]  furosemide (LASIX) 40 MG tablet Take 20 mg by mouth daily as needed for fluid or edema.    Yes [provider]  gabapentin (NEURONTIN) 300 MG capsule Take 2 capsules (600 mg total) by mouth 3 (three) times daily. Patient taking differently: Take 600 mg by mouth 2 (two) times daily. May take an additional 600mg  as needed for pain 07/16/16  Yes Molli Barrows, MD  levocetirizine (XYZAL) 5 MG tablet Take 5 mg by mouth at bedtime.    Yes [provider]  mirabegron ER (MYRBETRIQ) 50 MG TB24 tablet Take 50 mg by mouth daily.   Yes [provider]  montelukast (SINGULAIR) 10 MG tablet Take 10 mg by mouth at bedtime.   Yes [provider]  Multiple Vitamins-Minerals (ICAPS AREDS 2) CAPS Take 1 capsule by mouth 2 (two) times daily.    Yes [provider]  Multiple Vitamins-Minerals  (MULTIVITAMIN GUMMIES ADULTS) CHEW Chew 2 tablets by mouth daily.    Yes [provider]  nortriptyline (PAMELOR) 50 MG capsule Take 50 mg by mouth at bedtime.   Yes [provider]  Omega-3 Fatty Acids (FISH OIL) 1000 MG CAPS Take 1000mg  every 3rd day   Yes [provider]  potassium chloride SA (K-DUR,KLOR-CON) 20 MEQ tablet Take 20 meq every third day   Yes [provider]  Probiotic Product (PROBIOTIC COLON SUPPORT) CAPS Take 1 capsule by mouth at bedtime.   Yes [provider]  ramipril (ALTACE) 10 MG capsule Take 10 mg by mouth daily as needed (high blood pressure).    Yes [provider]  sucralfate (  CARAFATE) 1 G tablet Take 1 g by mouth 2 (two) times daily as needed (esophageal burning).    Yes [provider]  triamterene-hydrochlorothiazide (MAXZIDE) 75-50 MG tablet Take 1 tablet by mouth daily as needed (severe edema).    Yes [provider]  Vitamin D, Ergocalciferol, (DRISDOL) 50000 UNITS CAPS capsule Take 50,000 Units by mouth every 7 (seven) days. thursdays   Yes [provider]  ranitidine (ZANTAC) 150 MG tablet Take 150 mg by mouth every 4 (four) hours.    [provider]    Family History Family History  Problem Relation Age of Onset  . Hypertension Father   . Deep vein thrombosis Father   . Dementia Mother   . Diabetes Sister   . Congestive Heart Failure Sister   . Congestive Heart Failure Brother   . Congestive Heart Failure Daughter   . Congestive Heart Failure Son   . Breast cancer Maternal Aunt        great aunt    Social History Social History  Substance Use Topics  . Smoking status: Current Every Day Smoker    Packs/day: 0.25    Years: 50.00  . Smokeless tobacco: Former User    Types: Snuff, Sarina Ser    Quit date: 01/13/1995     Comment: Planned quit date August 28, 2016  . Alcohol use No     Allergies   Iodine; Other; and Tape   Review of Systems Review of  Systems  Cardiovascular: Positive for leg swelling.  Genitourinary: Positive for decreased urine volume and pelvic pain. Negative for dysuria, frequency and urgency.  Musculoskeletal: Positive for back pain.   Physical Exam Triage Vital Signs ED Triage Vitals [08/23/16 1005]  Enc Vitals Group     BP (!) 125/99     Pulse Rate 91     Resp 18     Temp 98.1 F (36.7 C)     Temp Source Oral     SpO2 98 %     Weight      Height      Head Circumference      Peak Flow      Pain Score      Pain Loc      Pain Edu?      Excl. in [place]?    No data found.   Updated Vital Signs BP (!) 125/99 (BP Location: Left Arm)   Pulse 91   Temp 98.1 F (36.7 C) (Oral)   Resp 18   SpO2 98%    Physical Exam  Constitutional: She is oriented to person, place, and time. She appears well-developed. No distress.  HENT:  Head: Normocephalic and atraumatic.  Cardiovascular: Normal rate.   Irregular. Secondary to ectopy. She has known PVCs. 2+ ankle edema.  Pulmonary/Chest: Effort normal and breath sounds normal.  Abdominal:  Obese. Soft, mild tenderness in the suprapubic region..  Neurological: She is alert and oriented to person, place, and time.  Psychiatric: She has a normal mood and affect.  Vitals reviewed.  UC Treatments / Results  Labs (all labs ordered are listed, but only abnormal results are displayed) Labs Reviewed  URINALYSIS, COMPLETE (UACMP) WITH MICROSCOPIC - Abnormal; Notable for the following:       Result Value   Hgb urine dipstick TRACE (*)    Squamous Epithelial / LPF 0-5 (*)    All other components within normal limits  URINE CULTURE    EKG  EKG Interpretation None  Radiology [place] Lower Ext Art Right Ltd  Result Date: 08/21/2016 CLINICAL DATA:  66 year old female status post heart catheterization on 08/12/2016 with persistent right groin pain. Evaluate for pseudoaneurysm. EXAM: UNILATERAL RIGHT LOWER EXTREMITY ARTERIAL DUPLEX SCAN TECHNIQUE: Gray-scale  sonography as well as color Doppler and duplex ultrasound was performed to evaluate the arteries of the lower extremity. COMPARISON:  None. FINDINGS: Sonographic interrogation of the right common femoral artery and a reason of the catheterization site demonstrates a small heterogeneous echogenic structure anterior to the artery measuring 2.0 x 1.2 x 1.6 cm. There is no evidence of internal vascularity. This likely represents a small hematoma. No patent pseudoaneurysm. Incidental imaging of the adjacent common femoral vein demonstrates hypoechoic material adherent to the anterior an anterolateral wall. The vessel is incompletely compressible. Findings are most consistent with nonocclusive acute thrombus. IMPRESSION: 1. Positive for incidental the significant finding of nonocclusive DVT in the right common femoral vein. 2. Small hematoma overlying the right common femoral artery without evidence of pseudoaneurysm. These results will be called to the ordering clinician or representative by the Radiologist Assistant, and communication documented in the PACS or zVision Dashboard. Electronically Signed   By: Jacqulynn Cadet M.D.   On: 08/21/2016 16:32    Procedures Procedures (including critical care time)  Medications Ordered in UC Medications - No data to display   Initial Impression / Assessment and Plan / UC Course  I have reviewed the triage vital signs and the nursing notes.  Pertinent labs & imaging results that were available during my care of the patient were reviewed by me and considered in my medical decision making (see chart for details).    66 year old female with an extensive past medical history including relatively new onset systolic heart failure and recent DVT. She is currently on xarelto. She is concerned that she has UTI. Trace hemoglobin noted on urinalysis. Doubt UTI. Sending culture. I advised her to limit her fluid intake and to call to schedule appointment with her cardiologist  to discuss her Lasix dosing. Likely underdosed.   Final Clinical Impressions(s) / UC Diagnoses   Final diagnoses:  Suprapubic pain    New Prescriptions New Prescriptions   No medications on file     Controlled Substance Prescriptions [place] Controlled Substance Registry consulted? Not Applicable   Coral Spikes, DO 08/23/16 1040

## 2016-08-23 NOTE — Discharge Instructions (Signed)
We will let you know if your urine culture is positive.  Please see your regular doctor as well as your cardiologist regarding lower extremity edema, and decreased urine output.  Take care  Dr. Lacinda Axon

## 2016-08-23 NOTE — ED Triage Notes (Signed)
Patient c/o lower back and lower pelvic pain x 1 week.

## 2016-08-24 ENCOUNTER — Encounter: Payer: Medicare Other | Admitting: *Deleted

## 2016-08-24 ENCOUNTER — Other Ambulatory Visit: Payer: Self-pay | Admitting: Internal Medicine

## 2016-08-24 DIAGNOSIS — I5022 Chronic systolic (congestive) heart failure: Secondary | ICD-10-CM

## 2016-08-24 DIAGNOSIS — I2699 Other pulmonary embolism without acute cor pulmonale: Secondary | ICD-10-CM

## 2016-08-24 NOTE — Progress Notes (Signed)
Incomplete Session Note  Patient Details  Name: Wendy Sanchez MRN: [id number] Date of Birth: 10-Jul-1950 Referring Provider:     Cardiac Rehab from 08/20/2016 in [facility]  Referring Provider  Lorrene Reid MD      [name] did not complete her rehab session.  [name] came to class today but did not exercise. She stated that she had a cath last week and a blood clot was found in her groin area. She was put on new meds and was supposed to see Dr. Clayborn Bigness today. She was advised by staff to ask Dr. Clayborn Bigness if he advised her to come back to cardiac rehab based on this new diagnosis. She did not exercise and went straight to Dr. Etta Quill office to speak with him. She was asked to bring back medical clearance prior to coming back to exercise.

## 2016-08-25 ENCOUNTER — Ambulatory Visit
Admission: RE | Admit: 2016-08-25 | Discharge: 2016-08-25 | Disposition: A | Discharge status: Home / Self Care | Payer: Medicare Other | Source: Ambulatory Visit | Attending: Internal Medicine | Admitting: Internal Medicine

## 2016-08-25 DIAGNOSIS — I2699 Other pulmonary embolism without acute cor pulmonale: Secondary | ICD-10-CM | POA: Insufficient documentation

## 2016-08-25 LAB — URINE CULTURE: Culture: <10000 — AB

## 2016-08-25 LAB — POCT I-STAT CREATININE: CREATININE: 0.7 mg/dL (ref 0.44–1.00)

## 2016-08-25 MED ORDER — IOPAMIDOL (ISOVUE-370) INJECTION 76%
100.0000 mL | Freq: Once | INTRAVENOUS | Status: AC | PRN
  Administered 2016-08-25: 100 mL via INTRAVENOUS

## 2016-08-26 ENCOUNTER — Encounter: Payer: Self-pay | Admitting: *Deleted

## 2016-08-26 DIAGNOSIS — I5022 Chronic systolic (congestive) heart failure: Secondary | ICD-10-CM

## 2016-08-26 NOTE — Progress Notes (Signed)
Cardiac Individual Treatment Plan  Patient Details  Name: AVIRA TILLISON MRN: [id number] Date of Birth: 09/25/1950 Referring Provider:     Cardiac Rehab from 08/20/2016 in [facility] Cardiac and Pulmonary Rehab  Referring Provider  Lorrene Reid MD      Initial Encounter Date:    Cardiac Rehab from 08/20/2016 in [facility] Cardiac and Pulmonary Rehab  Date  08/20/16  Referring Provider  Lorrene Reid MD      Visit Diagnosis: No diagnosis found.  Patient's Home Medications on Admission:  Current Outpatient Prescriptions:  .  albuterol (PROVENTIL HFA;VENTOLIN HFA) 108 (90 Base) MCG/ACT inhaler, Inhale 2 puffs into the lungs every 6 (six) hours as needed for wheezing or shortness of breath., Disp: , Rfl:  .  carvedilol (COREG) 3.125 MG tablet, Take by mouth., Disp: , Rfl:  .  clotrimazole (LOTRIMIN) 1 % cream, Apply 1 application topically 2 (two) times daily as needed (rash). , Disp: , Rfl:  .  cyclobenzaprine (FLEXERIL) 10 MG tablet, Take 1 tablet (10 mg total) by mouth 2 (two) times daily. for muscle spams (Patient taking differently: Take 10 mg by mouth 3 (three) times daily. for muscle spams), Disp: 60 tablet, Rfl: 3 .  dexlansoprazole (DEXILANT) 60 MG capsule, Take 60 mg by mouth daily., Disp: , Rfl:  .  diclofenac sodium (VOLTAREN) 1 % GEL, Apply 2 g topically 4 (four) times daily as needed (pain). , Disp: , Rfl:  .  diphenhydrAMINE (BENADRYL) 25 MG tablet, Take 50 mg by mouth at bedtime as needed for sleep. Also take 50 mgs by mouth every 4 hours pre-procedural med instruction, Disp: , Rfl:  .  docusate sodium (COLACE) 100 MG capsule, Take 200 mg by mouth at bedtime. , Disp: , Rfl:  .  fluticasone (FLONASE) 50 MCG/ACT nasal spray, Place 1 spray into both nostrils daily., Disp: , Rfl:  .  Fluticasone-Salmeterol (ADVAIR) 250-50 MCG/DOSE AEPB, Inhale 1 puff into the lungs 2 (two) times daily., Disp: , Rfl:  .  folic acid (FOLVITE) 1 MG tablet, Take 1 mg by mouth at bedtime. , Disp: , Rfl:   .  furosemide (LASIX) 40 MG tablet, Take 20 mg by mouth daily as needed for fluid or edema. , Disp: , Rfl:  .  gabapentin (NEURONTIN) 300 MG capsule, Take 2 capsules (600 mg total) by mouth 3 (three) times daily. (Patient taking differently: Take 600 mg by mouth 2 (two) times daily. May take an additional 600mg  as needed for pain), Disp: 180 capsule, Rfl: 1 .  levocetirizine (XYZAL) 5 MG tablet, Take 5 mg by mouth at bedtime. , Disp: , Rfl:  .  mirabegron ER (MYRBETRIQ) 50 MG TB24 tablet, Take 50 mg by mouth daily., Disp: , Rfl:  .  montelukast (SINGULAIR) 10 MG tablet, Take 10 mg by mouth at bedtime., Disp: , Rfl:  .  Multiple Vitamins-Minerals (ICAPS AREDS 2) CAPS, Take 1 capsule by mouth 2 (two) times daily. , Disp: , Rfl:  .  Multiple Vitamins-Minerals (MULTIVITAMIN GUMMIES ADULTS) CHEW, Chew 2 tablets by mouth daily. , Disp: , Rfl:  .  nortriptyline (PAMELOR) 50 MG capsule, Take 50 mg by mouth at bedtime., Disp: , Rfl:  .  Omega-3 Fatty Acids (FISH OIL) 1000 MG CAPS, Take 1000mg  every 3rd day, Disp: , Rfl:  .  potassium chloride SA (K-DUR,KLOR-CON) 20 MEQ tablet, Take 20 meq every third day, Disp: , Rfl:  .  Probiotic Product (PROBIOTIC COLON SUPPORT) CAPS, Take 1 capsule by mouth at bedtime., Disp: ,  Rfl:  .  ramipril (ALTACE) 10 MG capsule, Take 10 mg by mouth daily as needed (high blood pressure). , Disp: , Rfl:  .  ranitidine (ZANTAC) 150 MG tablet, Take 150 mg by mouth every 4 (four) hours., Disp: , Rfl:  .  sucralfate (CARAFATE) 1 G tablet, Take 1 g by mouth 2 (two) times daily as needed (esophageal burning). , Disp: , Rfl:  .  triamterene-hydrochlorothiazide (MAXZIDE) 75-50 MG tablet, Take 1 tablet by mouth daily as needed (severe edema). , Disp: , Rfl:  .  Vitamin D, Ergocalciferol, (DRISDOL) 50000 UNITS CAPS capsule, Take 50,000 Units by mouth every 7 (seven) days. thursdays, Disp: , Rfl:   Past Medical History: Past Medical History:  Diagnosis Date  . Allergic rhinitis   .  Arthritis   . Asthma    problems with fumes and aerosols cause asthma  . Complication of anesthesia    awakens during surgery; has occurred with last 3-4 surgeries   . Environmental allergies    fumes   . Fibromyalgia   . GERD (gastroesophageal reflux disease)   . History of bronchitis   . Hypertension   . Numbness    hands bilat when driving; improves when not preforming task   . Status post gastric banding   . Tinnitus    comes and goes   . Vertigo    6 months ago approx     Tobacco Use: History  Smoking Status  . Current Every Day Smoker  . Packs/day: 0.25  . Years: 50.00  Smokeless Tobacco  . Former Systems developer  . Types: Snuff, Chew  . Quit date: 01/13/1995    Comment: Planned quit date August 28, 2016    Labs: Recent Review Scientist, physiological    Labs for ITP Cardiac and Pulmonary Rehab 03/16/2007   Cholestrol 236 ATP III CLASSIFICATION: <200     mg/dL   Desirable 200-239  mg/dL   Borderline High >=240    mg/dL   High(H)   LDLCALC 150 Total Cholesterol/HDL:CHD Risk Coronary Heart Disease Risk Table Men   Women 1/2 Average Risk   3.4   3.3(H)   HDL 71   Trlycerides 76       Exercise Target Goals:    Exercise Program Goal: Individual exercise prescription set with THRR, safety & activity barriers. Participant demonstrates ability to understand and report RPE using BORG scale, to self-measure pulse accurately, and to acknowledge the importance of the exercise prescription.  Exercise Prescription Goal: Starting with aerobic activity 30 plus minutes a day, 3 days per week for initial exercise prescription. Provide home exercise prescription and guidelines that participant acknowledges understanding prior to discharge.  Activity Barriers & Risk Stratification:     Activity Barriers & Cardiac Risk Stratification - 08/20/16 1410      Activity Barriers & Cardiac Risk Stratification   Activity Barriers Arthritis;Left Knee Replacement;Right Knee Replacement;Shortness of  Breath;Muscular Weakness;Deconditioning;Joint Problems;Fibromyalgia  Knee replacements were done in 2017.  Right shoulder rotator cuff repair, removal of bone spurs and other repair done. Has ganglion on left wrist. Arthritis Right ankle   Cardiac Risk Stratification High      6 Minute Walk:     6 Minute Walk    Row Name 08/20/16 1440         6 Minute Walk   Phase Initial     Distance 830 feet     Walk Time 6 minutes     # of Rest Breaks 1  1:02  MPH 1.9     METS 1.22     RPE 17     Perceived Dyspnea  4     VO2 Peak 4.29     Symptoms Yes (comment)     Comments SOB, right sided chest pain, leg pain 8/10     Resting HR 91 bpm     Resting BP 124/70     Max Ex. HR 118 bpm     Max Ex. BP 128/74     2 Minute Post BP 116/66        Oxygen Initial Assessment:   Oxygen Re-Evaluation:   Oxygen Discharge (Final Oxygen Re-Evaluation):   Initial Exercise Prescription:     Initial Exercise Prescription - 08/20/16 1400      Date of Initial Exercise RX and Referring Provider   Date 08/20/16   Referring Provider Lorrene Reid MD     Treadmill   MPH 1.5   Grade 0   Minutes 15   METs 2.15     Recumbant Elliptical   Level 1   RPM 50   Minutes 15   METs 2     T5 Nustep   Level 1   SPM 80   Minutes 15   METs 2     Prescription Details   Frequency (times per week) 3   Duration Progress to 45 minutes of aerobic exercise without signs/symptoms of physical distress     Intensity   THRR 40-80% of Max Heartrate 117-142   Ratings of Perceived Exertion 11-13   Perceived Dyspnea 0-4     Progression   Progression Continue to progress workloads to maintain intensity without signs/symptoms of physical distress.     Resistance Training   Training Prescription Yes   Weight 2 lbs   Reps 10-15      Perform Capillary Blood Glucose checks as needed.  Exercise Prescription Changes:     Exercise Prescription Changes    Row Name 08/20/16 1300              Response to Exercise   Blood Pressure (Admit) 124/70       Blood Pressure (Exercise) 128/74       Blood Pressure (Exit) 116/66       Heart Rate (Admit) 91 bpm       Heart Rate (Exercise) 121 bpm       Heart Rate (Exit) 101 bpm       Oxygen Saturation (Admit) 100 %       Oxygen Saturation (Exercise) 98 %       Rating of Perceived Exertion (Exercise) 17       Symptoms SOB, right sided chest pain, leg pain 8/10       Comments walk test results          Exercise Comments:   Exercise Goals and Review:     Exercise Goals    Row Name 08/20/16 1444             Exercise Goals   Increase Physical Activity Yes       Intervention Provide advice, education, support and counseling about physical activity/exercise needs.;Develop an individualized exercise prescription for aerobic and resistive training based on initial evaluation findings, risk stratification, comorbidities and participant's personal goals.       Expected Outcomes Achievement of increased cardiorespiratory fitness and enhanced flexibility, muscular endurance and strength shown through measurements of functional capacity and personal statement of participant.       Increase Strength and  Stamina Yes       Intervention Provide advice, education, support and counseling about physical activity/exercise needs.;Develop an individualized exercise prescription for aerobic and resistive training based on initial evaluation findings, risk stratification, comorbidities and participant's personal goals.       Expected Outcomes Achievement of increased cardiorespiratory fitness and enhanced flexibility, muscular endurance and strength shown through measurements of functional capacity and personal statement of participant.          Exercise Goals Re-Evaluation :   Discharge Exercise Prescription (Final Exercise Prescription Changes):     Exercise Prescription Changes - 08/20/16 1300      Response to Exercise   Blood Pressure  (Admit) 124/70   Blood Pressure (Exercise) 128/74   Blood Pressure (Exit) 116/66   Heart Rate (Admit) 91 bpm   Heart Rate (Exercise) 121 bpm   Heart Rate (Exit) 101 bpm   Oxygen Saturation (Admit) 100 %   Oxygen Saturation (Exercise) 98 %   Rating of Perceived Exertion (Exercise) 17   Symptoms SOB, right sided chest pain, leg pain 8/10   Comments walk test results      Nutrition:  Target Goals: Understanding of nutrition guidelines, daily intake of sodium 1500mg , cholesterol 200mg , calories 30% from fat and 7% or less from saturated fats, daily to have 5 or more servings of fruits and vegetables.  Biometrics:     Pre Biometrics - 08/20/16 1444      Pre Biometrics   Height 5' 7.4" (1.712 m)   Weight (!)  318 lb 14.4 oz (144.7 kg)   Waist Circumference 44.5 inches   Hip Circumference 65 inches   Waist to Hip Ratio 0.68 %   BMI (Calculated) 49.5   Single Leg Stand 1.73 seconds       Nutrition Therapy Plan and Nutrition Goals:     Nutrition Therapy & Goals - 08/20/16 1346      Intervention Plan   Intervention Prescribe, educate and counsel regarding individualized specific dietary modifications aiming towards targeted core components such as weight, hypertension, lipid management, diabetes, heart failure and other comorbidities.   Expected Outcomes Short Term Goal: Understand basic principles of dietary content, such as calories, fat, sodium, cholesterol and nutrients.;Short Term Goal: A plan has been developed with personal nutrition goals set during dietitian appointment.;Long Term Goal: Adherence to prescribed nutrition plan.      Nutrition Discharge: Rate Your Plate Scores:     Nutrition Assessments - 08/20/16 1346      MEDFICTS Scores   Pre Score 35      Nutrition Goals Re-Evaluation:   Nutrition Goals Discharge (Final Nutrition Goals Re-Evaluation):   Psychosocial: Target Goals: Acknowledge presence or absence of significant depression and/or stress,  maximize coping skills, provide positive support system. Participant is able to verbalize types and ability to use techniques and skills needed for reducing stress and depression.   Initial Review & Psychosocial Screening:     Initial Psych Review & Screening - 08/20/16 1409      Initial Review   Comments Tarnisha is aso stredd about the fact that her medical team has not been listening to her.  She wants to be heard , evaluated and treated.       Quality of Life Scores:      Quality of Life - 08/20/16 1350      Quality of Life Scores   Health/Function Pre 10.64 %   Socioeconomic Pre 11.44 %   Psych/Spiritual Pre 16.29 %   Family Pre  5.63 %   GLOBAL Pre 10.64 %      PHQ-9: Recent Review Flowsheet Data    Depression screen [facility] 2/9 08/20/2016 07/16/2016 05/18/2016 03/19/2016 01/09/2016   Decreased Interest 0 0 0 0 0   Down, Depressed, Hopeless 0 0 0 0 0   PHQ - 2 Score 0 0 0 0 0   Altered sleeping 3 - - - -   Tired, decreased energy 2 - - - -   Change in appetite 3 - - - -   Feeling bad or failure about yourself  0 - - - -   Trouble concentrating 0 - - - -   Moving slowly or fidgety/restless 0 - - - -   Suicidal thoughts 0 - - - -   PHQ-9 Score 8 - - - -   Difficult doing work/chores Very difficult - - - -     Interpretation of Total Score  Total Score Depression Severity:  1-4 = Minimal depression, 5-9 = Mild depression, 10-14 = Moderate depression, 15-19 = Moderately severe depression, 20-27 = Severe depression   Psychosocial Evaluation and Intervention:   Psychosocial Re-Evaluation:   Psychosocial Discharge (Final Psychosocial Re-Evaluation):   Vocational Rehabilitation: Provide vocational rehab assistance to qualifying candidates.   Vocational Rehab Evaluation & Intervention:     Vocational Rehab - 08/20/16 1358      Initial Vocational Rehab Evaluation & Intervention   Assessment shows need for Vocational Rehabilitation No      Education: Education  Goals: Education classes will be provided on a weekly basis, covering required topics. Participant will state understanding/return demonstration of topics presented.  Learning Barriers/Preferences:     Learning Barriers/Preferences - 08/20/16 1357      Learning Barriers/Preferences   Learning Barriers Sight  Left eye vision at times is blurred   Learning Preferences None      Education Topics: General Nutrition Guidelines/Fats and Fiber: -Group instruction provided by verbal, written material, models and posters to present the general guidelines for heart healthy nutrition. Gives an explanation and review of dietary fats and fiber.   Controlling Sodium/Reading Food Labels: -Group verbal and written material supporting the discussion of sodium use in heart healthy nutrition. Review and explanation with models, verbal and written materials for utilization of the food label.   Exercise Physiology & Risk Factors: - Group verbal and written instruction with models to review the exercise physiology of the cardiovascular system and associated critical values. Details cardiovascular disease risk factors and the goals associated with each risk factor.   Aerobic Exercise & Resistance Training: - Gives group verbal and written discussion on the health impact of inactivity. On the components of aerobic and resistive training programs and the benefits of this training and how to safely progress through these programs.   Flexibility, Balance, General Exercise Guidelines: - Provides group verbal and written instruction on the benefits of flexibility and balance training programs. Provides general exercise guidelines with specific guidelines to those with heart or lung disease. Demonstration and skill practice provided.   Stress Management: - Provides group verbal and written instruction about the health risks of elevated stress, cause of high stress, and healthy ways to reduce  stress.   Depression: - Provides group verbal and written instruction on the correlation between heart/lung disease and depressed mood, treatment options, and the stigmas associated with seeking treatment.   Anatomy & Physiology of the Heart: - Group verbal and written instruction and models provide basic cardiac anatomy and physiology, with  the coronary electrical and arterial systems. Review of: AMI, Angina, Valve disease, Heart Failure, Cardiac Arrhythmia, Pacemakers, and the ICD.   Cardiac Procedures: - Group verbal and written instruction and models to describe the testing methods done to diagnose heart disease. Reviews the outcomes of the test results. Describes the treatment choices: Medical Management, Angioplasty, or Coronary Bypass Surgery.   Cardiac Medications: - Group verbal and written instruction to review commonly prescribed medications for heart disease. Reviews the medication, class of the drug, and side effects. Includes the steps to properly store meds and maintain the prescription regimen.   Go Sex-Intimacy & Heart Disease, Get SMART - Goal Setting: - Group verbal and written instruction through game format to discuss heart disease and the return to sexual intimacy. Provides group verbal and written material to discuss and apply goal setting through the application of the S.M.A.R.T. Method.   Other Matters of the Heart: - Provides group verbal, written materials and models to describe Heart Failure, Angina, Valve Disease, and Diabetes in the realm of heart disease. Includes description of the disease process and treatment options available to the cardiac patient.   Exercise & Equipment Safety: - Individual verbal instruction and demonstration of equipment use and safety with use of the equipment.   Cardiac Rehab from 08/20/2016 in [facility] Cardiac and Pulmonary Rehab  Date  08/20/16  Educator  [facility]  Instruction Review Code  2- meets goals/outcomes      Infection  Prevention: - Provides verbal and written material to individual with discussion of infection control including proper hand washing and proper equipment cleaning during exercise session.   Cardiac Rehab from 08/20/2016 in [facility] Cardiac and Pulmonary Rehab  Date  08/20/16  Educator  [facility]  Instruction Review Code  2- meets goals/outcomes      Falls Prevention: - Provides verbal and written material to individual with discussion of falls prevention and safety.   Cardiac Rehab from 08/20/2016 in [facility] Cardiac and Pulmonary Rehab  Date  08/20/16  Educator  [facility]  Instruction Review Code  2- meets goals/outcomes      Diabetes: - Individual verbal and written instruction to review signs/symptoms of diabetes, desired ranges of glucose level fasting, after meals and with exercise. Advice that pre and post exercise glucose checks will be done for 3 sessions at entry of program.    Knowledge Questionnaire Score:     Knowledge Questionnaire Score - 08/20/16 1358      Knowledge Questionnaire Score   Pre Score 20/28  Reviewed correct responses with [name] today. She verbalized understanding of the responses.      Core Components/Risk Factors/Patient Goals at Admission:     Personal Goals and Risk Factors at Admission - 08/20/16 1412      Core Components/Risk Factors/Patient Goals on Admission    Weight Management Yes;Obesity;Weight Loss   Admit Weight 317 lb 11.2 oz (144.1 kg)   Goal Weight: Short Term 315 lb (142.9 kg)   Goal Weight: Long Term 250 lb (113.4 kg)   Expected Outcomes Short Term: Continue to assess and modify interventions until short term weight is achieved;Long Term: Adherence to nutrition and physical activity/exercise program aimed toward attainment of established weight goal;Weight Loss: Understanding of general recommendations for a balanced deficit meal plan, which promotes 1-2 lb weight loss per week and includes a negative energy balance of [phone] kcal/d;Understanding of  distribution of calorie intake throughout the day with the consumption of 4-5 meals/snacks;Understanding recommendations for meals to include 15-35% energy as protein,  25-35% energy from fat, 35-60% energy from carbohydrates, less than 200mg  of dietary cholesterol, 20-35 gm of total fiber daily   Tobacco Cessation Yes   Number of packs per day is smoking about 8 a day   correction SB   Intervention Assist the participant in steps to quit. Provide individualized education and counseling about committing to Tobacco Cessation, relapse prevention, and pharmacological support that can be provided by physician.;Advice worker, assist with locating and accessing local/national Quit Smoking programs, and support quit date choice.   Expected Outcomes Short Term: Will demonstrate readiness to quit, by selecting a quit date.;Short Term: Will quit all tobacco product use, adhering to prevention of relapse plan.;Long Term: Complete abstinence from all tobacco products for at least 12 months from quit date.   Improve shortness of breath with ADL's Yes   Intervention Provide education, individualized exercise plan and daily activity instruction to help decrease symptoms of SOB with activities of daily living.   Expected Outcomes Short Term: Achieves a reduction of symptoms when performing activities of daily living.   Increase knowledge of respiratory medications and ability to use respiratory devices properly  Yes   Intervention Provide education and demonstration as needed of appropriate use of medications, inhalers, and oxygen therapy.   Expected Outcomes Short Term: Achieves understanding of medications use. Understands that oxygen is a medication prescribed by physician. Demonstrates appropriate use of inhaler and oxygen therapy.   Heart Failure Yes   Intervention Provide a combined exercise and nutrition program that is supplemented with education, support and counseling about heart failure.  Directed toward relieving symptoms such as shortness of breath, decreased exercise tolerance, and extremity edema.   Expected Outcomes Improve functional capacity of life;Short term: Attendance in program 2-3 days a week with increased exercise capacity. Reported lower sodium intake. Reported increased fruit and vegetable intake. Reports medication compliance.;Short term: Daily weights obtained and reported for increase. Utilizing diuretic protocols set by physician.;Long term: Adoption of self-care skills and reduction of barriers for early signs and symptoms recognition and intervention leading to self-care maintenance.   Hypertension Yes   Intervention Provide education on lifestyle modifcations including regular physical activity/exercise, weight management, moderate sodium restriction and increased consumption of fresh fruit, vegetables, and low fat dairy, alcohol moderation, and smoking cessation.;Monitor prescription use compliance.   Expected Outcomes Short Term: Continued assessment and intervention until BP is < 140/75mm HG in hypertensive participants. < 130/40mm HG in hypertensive participants with diabetes, heart failure or chronic kidney disease.;Long Term: Maintenance of blood pressure at goal levels.   Lipids Yes   Intervention Provide education and support for participant on nutrition & aerobic/resistive exercise along with prescribed medications to achieve LDL 70mg , HDL >40mg .   Expected Outcomes Short Term: Participant states understanding of desired cholesterol values and is compliant with medications prescribed. Participant is following exercise prescription and nutrition guidelines.;Long Term: Cholesterol controlled with medications as prescribed, with individualized exercise RX and with personalized nutrition plan. Value goals: LDL < 70mg , HDL > 40 mg.      Core Components/Risk Factors/Patient Goals Review:    Core Components/Risk Factors/Patient Goals at Discharge (Final  Review):    ITP Comments:     ITP Comments    Row Name 08/20/16 1337 08/20/16 1338 08/24/16 0915 08/26/16 [id number]     ITP Comments Medical review completed today. INitial ITP created for review and changes if required and signature by Dr Sabra Heck.  Diagnosis documentation can be found in [facility] Medical review completed today. INitial ITP  created for review and changes if required and signature by Dr Sabra Heck.  Diagnosis documentation can be found in [facility] encounter 08/06/2016 [name] did not complete her rehab session.  Rayvon came to class today but did not exercise. She stated that she had a cath last week and a blood clot was found in her groin area. She was put on new meds and was supposed to see Dr. Clayborn Bigness today. She was advised by staff to ask Dr. Clayborn Bigness if he advised her to come back to cardiac rehab based on this new diagnosis. She did not exercise and went straight to Dr. Etta Quill office to speak with him. She was asked to bring back medical clearance prior to coming back to exercise.  30 day review. Continue with ITP unless directed changes per Medical Director review   New to program       Comments:

## 2016-08-26 NOTE — Progress Notes (Signed)
Daily Session Note  Patient Details  Name: Wendy Sanchez MRN: [id number] Date of Birth: 1951/01/11 Referring Provider:     Cardiac Rehab from 08/20/2016 in [facility] Cardiac and Pulmonary Rehab  Referring Provider  Lorrene Reid MD      Encounter Date: 08/26/2016  Check In:     Session Check In - 08/26/16 0742      Check-In   Location [facility]-Cardiac & Pulmonary Rehab   Staff Present Heath Lark, RN, BSN, Gordy Councilman, MA, ACSM RCEP, Exercise Physiologist;Moon Budde Flavia Shipper   Supervising physician immediately available to respond to emergencies See telemetry face sheet for immediately available ER MD   Medication changes reported     Yes   Comments started Xarellto last Friday.   Fall or balance concerns reported    No   Tobacco Cessation Use Decreased  patient quit last Saturday   Warm-up and Cool-down Performed on first and last piece of equipment   Resistance Training Performed Yes   VAD Patient? No     Pain Assessment   Currently in Pain? No/denies   Multiple Pain Sites No         History  Smoking Status  . Former Smoker  . Packs/day: 0.25  . Years: 50.00  . Quit date: 08/22/2016  Smokeless Tobacco  . Former Systems developer  . Types: Snuff, Chew  . Quit date: 01/13/1995    Comment: Planned quit date August 28, 2016    Goals Met:  Exercise tolerated well No report of cardiac concerns or symptoms Strength training completed today  Goals Unmet:  Not Applicable  Comments: First full day of exercise!  Patient was oriented to gym and equipment including functions, settings, policies, and procedures.  Patient's individual exercise prescription and treatment plan were reviewed.  All starting workloads were established based on the results of the 6 minute walk test done at initial orientation visit.  The plan for exercise progression was also introduced and progression will be customized based on patient's performance and goals.   Dr. Emily Filbert is Medical  Director for [place] and [facility].

## 2016-08-28 ENCOUNTER — Telehealth: Payer: Self-pay

## 2016-08-28 NOTE — Telephone Encounter (Signed)
Called pt and relayed the results to her. She stated she is still having the pain and would call her pcp today to follow up on her pain.

## 2016-08-28 NOTE — Telephone Encounter (Signed)
-----   Message from Sherlene Shams, MD sent at 08/26/2016  5:28 PM EDT ----- Please let patient know that urine culture does not suggest a UTI.  Need to consider other explanations for suprapubic pain.  Followup with PCP as discussed at urgent care visit 8/12.  LM

## 2016-08-31 ENCOUNTER — Encounter: Payer: Medicare Other | Admitting: *Deleted

## 2016-08-31 VITALS — BP 164/70 | HR 135 | Wt 322.6 lb

## 2016-08-31 DIAGNOSIS — I5022 Chronic systolic (congestive) heart failure: Secondary | ICD-10-CM | POA: Diagnosis not present

## 2016-08-31 NOTE — Progress Notes (Signed)
Daily Session Note  Patient Details  Name: Wendy Sanchez MRN: [id number] Date of Birth: July 25, 1950 Referring Provider:     Cardiac Rehab from 08/20/2016 in [facility] Cardiac and Pulmonary Rehab  Referring Provider  Lorrene Reid MD      Encounter Date: 08/31/2016  Check In:     Session Check In - 08/31/16 0844      Check-In   Location [facility]-Cardiac & Pulmonary Rehab   Staff Present Carson Myrtle, BS, RRT, Respiratory Therapist;Carroll Enterkin, RN, Moises Blood, BS, ACSM CEP, Exercise Physiologist   Supervising physician immediately available to respond to emergencies See telemetry face sheet for immediately available ER MD   Medication changes reported     No   Fall or balance concerns reported    No   Tobacco Cessation No Change   Warm-up and Cool-down Performed on first and last piece of equipment   Resistance Training Performed Yes   VAD Patient? No     Pain Assessment   Currently in Pain? No/denies   Multiple Pain Sites No         History  Smoking Status  . Former Smoker  . Packs/day: 0.25  . Years: 50.00  . Quit date: 08/22/2016  Smokeless Tobacco  . Former Systems developer  . Types: Snuff, Chew  . Quit date: 01/13/1995    Comment: Planned quit date August 28, 2016    Goals Met:  Independence with exercise equipment Exercise tolerated well No report of cardiac concerns or symptoms Strength training completed today  Goals Unmet:  Not Applicable  Comments: Pt able to follow exercise prescription today without complaint.  Will continue to monitor for progression.    Dr. Emily Filbert is Medical Director for [place] and [facility].

## 2016-09-02 DIAGNOSIS — I5022 Chronic systolic (congestive) heart failure: Secondary | ICD-10-CM | POA: Diagnosis not present

## 2016-09-02 NOTE — Progress Notes (Signed)
Daily Session Note  Patient Details  Name: Wendy Sanchez MRN: [id number] Date of Birth: 1950-12-16 Referring Provider:     Cardiac Rehab from 08/20/2016 in [facility] Cardiac and Pulmonary Rehab  Referring Provider  Lorrene Reid MD      Encounter Date: 09/02/2016  Check In:     Session Check In - 09/02/16 0829      Check-In   Location [facility]-Cardiac & Pulmonary Rehab   Staff Present Alberteen Sam, MA, ACSM RCEP, Exercise Physiologist;Susanne Bice, RN, BSN, CCRP;Ashar Lewinski Flavia Shipper   Supervising physician immediately available to respond to emergencies See telemetry face sheet for immediately available ER MD   Medication changes reported     No   Fall or balance concerns reported    No   Warm-up and Cool-down Performed on first and last piece of equipment   Resistance Training Performed Yes   VAD Patient? No     Pain Assessment   Currently in Pain? No/denies   Multiple Pain Sites No           Exercise Prescription Changes - 09/01/16 1500      Response to Exercise   Blood Pressure (Admit) 126/76   Blood Pressure (Exercise) 164/70   Blood Pressure (Exit) 130/70   Heart Rate (Admit) 107 bpm   Heart Rate (Exercise) 140 bpm   Heart Rate (Exit) 95 bpm   Rating of Perceived Exertion (Exercise) 15   Symptoms chest tightness   Comments 2nd full day of exercise   Duration Progress to 45 minutes of aerobic exercise without signs/symptoms of physical distress   Intensity THRR unchanged     Progression   Progression Continue to progress workloads to maintain intensity without signs/symptoms of physical distress.   Average METs 1.92     Resistance Training   Training Prescription Yes   Weight 2 lbs   Reps 10-15     Interval Training   Interval Training No     Treadmill   MPH 1.5   Grade 0   Minutes 15   METs 2.15     Recumbant Elliptical   Level 1   Minutes 15   METs 1.6     T5 Nustep   Level 3   Minutes 15   METs 2      History  Smoking Status   . Former Smoker  . Packs/day: 0.25  . Years: 50.00  . Quit date: 08/22/2016  Smokeless Tobacco  . Former Systems developer  . Types: Snuff, Chew  . Quit date: 01/13/1995    Comment: Planned quit date August 28, 2016    Goals Met:  Independence with exercise equipment Exercise tolerated well No report of cardiac concerns or symptoms Strength training completed today  Goals Unmet:  Not Applicable  Comments: Pt able to follow exercise prescription today without complaint.  Will continue to monitor for progression.   Dr. Emily Filbert is Medical Director for [place] and [facility].

## 2016-09-07 ENCOUNTER — Encounter: Payer: Medicare Other | Admitting: *Deleted

## 2016-09-07 VITALS — BP 136/72 | HR 96 | Wt 317.0 lb

## 2016-09-07 DIAGNOSIS — I5022 Chronic systolic (congestive) heart failure: Secondary | ICD-10-CM

## 2016-09-07 NOTE — Progress Notes (Signed)
Daily Session Note  Patient Details  Name: Wendy Sanchez MRN: [id number] Date of Birth: 1950/07/11 Referring Provider:     Cardiac Rehab from 08/20/2016 in [facility] Cardiac and Pulmonary Rehab  Referring Provider  Lorrene Reid MD      Encounter Date: 09/07/2016  Check In:     Session Check In - 09/07/16 0829      Check-In   Location [facility]-Cardiac & Pulmonary Rehab   Staff Present Alberteen Sam, MA, ACSM RCEP, Exercise Physiologist;Carroll Enterkin, RN, Moises Blood, BS, ACSM CEP, Exercise Physiologist   Supervising physician immediately available to respond to emergencies See telemetry face sheet for immediately available ER MD   Medication changes reported     No   Fall or balance concerns reported    No   Tobacco Cessation No Change   Warm-up and Cool-down Performed on first and last piece of equipment   Resistance Training Performed Yes   VAD Patient? No     Pain Assessment   Currently in Pain? No/denies   Multiple Pain Sites No         History  Smoking Status  . Former Smoker  . Packs/day: 0.25  . Years: 50.00  . Quit date: 08/22/2016  Smokeless Tobacco  . Former Systems developer  . Types: Snuff, Chew  . Quit date: 01/13/1995    Comment: Planned quit date August 28, 2016    Goals Met:  Independence with exercise equipment Exercise tolerated well Personal goals reviewed No report of cardiac concerns or symptoms Strength training completed today  Goals Unmet:  Not Applicable  Comments: Pt able to follow exercise prescription today without complaint.  Will continue to monitor for progression.  Reviewed home exercise with pt today.  Pt plans to walk and go to [employer] for exercise.  Reviewed THR, pulse, RPE, sign and symptoms, NTG use, and when to call 911 or MD.  Also discussed weather considerations and indoor options.  Pt voiced understanding.   Dr. Emily Filbert is Medical Director for [place] and [facility].

## 2016-09-08 ENCOUNTER — Ambulatory Visit: Payer: Medicare Other | Attending: Anesthesiology | Admitting: Anesthesiology

## 2016-09-08 ENCOUNTER — Encounter: Payer: Self-pay | Admitting: Anesthesiology

## 2016-09-08 VITALS — BP 115/98 | HR 51 | Temp 98.2°F | Resp 20 | Ht 67.0 in | Wt 317.0 lb

## 2016-09-08 DIAGNOSIS — K219 Gastro-esophageal reflux disease without esophagitis: Secondary | ICD-10-CM | POA: Insufficient documentation

## 2016-09-08 DIAGNOSIS — M797 Fibromyalgia: Secondary | ICD-10-CM | POA: Diagnosis not present

## 2016-09-08 DIAGNOSIS — Z7951 Long term (current) use of inhaled steroids: Secondary | ICD-10-CM | POA: Diagnosis not present

## 2016-09-08 DIAGNOSIS — Z96653 Presence of artificial knee joint, bilateral: Secondary | ICD-10-CM | POA: Insufficient documentation

## 2016-09-08 DIAGNOSIS — Z79891 Long term (current) use of opiate analgesic: Secondary | ICD-10-CM | POA: Diagnosis not present

## 2016-09-08 DIAGNOSIS — G8929 Other chronic pain: Secondary | ICD-10-CM

## 2016-09-08 DIAGNOSIS — Z90710 Acquired absence of both cervix and uterus: Secondary | ICD-10-CM | POA: Diagnosis not present

## 2016-09-08 DIAGNOSIS — Z8249 Family history of ischemic heart disease and other diseases of the circulatory system: Secondary | ICD-10-CM | POA: Diagnosis not present

## 2016-09-08 DIAGNOSIS — F1190 Opioid use, unspecified, uncomplicated: Secondary | ICD-10-CM

## 2016-09-08 DIAGNOSIS — M25551 Pain in right hip: Secondary | ICD-10-CM | POA: Insufficient documentation

## 2016-09-08 DIAGNOSIS — J45909 Unspecified asthma, uncomplicated: Secondary | ICD-10-CM | POA: Diagnosis not present

## 2016-09-08 DIAGNOSIS — Z7901 Long term (current) use of anticoagulants: Secondary | ICD-10-CM | POA: Insufficient documentation

## 2016-09-08 DIAGNOSIS — R42 Dizziness and giddiness: Secondary | ICD-10-CM | POA: Diagnosis not present

## 2016-09-08 DIAGNOSIS — Z87891 Personal history of nicotine dependence: Secondary | ICD-10-CM | POA: Insufficient documentation

## 2016-09-08 DIAGNOSIS — M48061 Spinal stenosis, lumbar region without neurogenic claudication: Secondary | ICD-10-CM | POA: Diagnosis not present

## 2016-09-08 DIAGNOSIS — Z79899 Other long term (current) drug therapy: Secondary | ICD-10-CM | POA: Diagnosis not present

## 2016-09-08 DIAGNOSIS — M25532 Pain in left wrist: Secondary | ICD-10-CM | POA: Insufficient documentation

## 2016-09-08 DIAGNOSIS — M19011 Primary osteoarthritis, right shoulder: Secondary | ICD-10-CM | POA: Diagnosis not present

## 2016-09-08 DIAGNOSIS — M1990 Unspecified osteoarthritis, unspecified site: Secondary | ICD-10-CM | POA: Diagnosis not present

## 2016-09-08 DIAGNOSIS — R51 Headache: Secondary | ICD-10-CM | POA: Diagnosis not present

## 2016-09-08 DIAGNOSIS — G894 Chronic pain syndrome: Secondary | ICD-10-CM | POA: Insufficient documentation

## 2016-09-08 DIAGNOSIS — I119 Hypertensive heart disease without heart failure: Secondary | ICD-10-CM | POA: Diagnosis not present

## 2016-09-08 DIAGNOSIS — M25571 Pain in right ankle and joints of right foot: Secondary | ICD-10-CM | POA: Diagnosis not present

## 2016-09-08 DIAGNOSIS — Z833 Family history of diabetes mellitus: Secondary | ICD-10-CM | POA: Diagnosis not present

## 2016-09-08 DIAGNOSIS — Z9884 Bariatric surgery status: Secondary | ICD-10-CM | POA: Insufficient documentation

## 2016-09-08 DIAGNOSIS — Z9049 Acquired absence of other specified parts of digestive tract: Secondary | ICD-10-CM | POA: Diagnosis not present

## 2016-09-08 DIAGNOSIS — M48062 Spinal stenosis, lumbar region with neurogenic claudication: Secondary | ICD-10-CM

## 2016-09-08 DIAGNOSIS — M25562 Pain in left knee: Secondary | ICD-10-CM | POA: Diagnosis present

## 2016-09-08 MED ORDER — OXYCODONE-ACETAMINOPHEN 7.5-325 MG PO TABS: 1.0000 | ORAL_TABLET | Freq: Four times a day (QID) | ORAL | 0 refills | Status: DC | PRN

## 2016-09-08 NOTE — Progress Notes (Signed)
Subjective:  Patient ID: Wendy Sanchez, female    DOB: 02-Jul-1950  Age: 66 y.o. MRN: [id number]  CC: Knee Pain (left); Ankle Pain (right); Wrist Pain (left); Shoulder Pain (bilateral); and Headache   Service Provided on Last Visit: Med Refill PROCEDURE:None  HPI Wendy Sanchez presents for reevaluation last seen 2 months ago. Since that time she has continued to have pain of a diffuse nature. He primarily affects her right shoulder right hip and low back. She's been taking her medications as prescribed generally 3-4 times a day. These cause minimal side effect and continue to give her good lifestyle improvement based on her narcotic assessment sheet. No change in quality characteristic or distribution of her pain is otherwise noted. No change in lower extremity strength or function or bowel or bladder function is noted.  She was recently seen by her cardiologist and had a stress test showing evidence of some ischemia and bigeminy and multifocal PVCs. She continues to follow-up with her cardiologist at this point.  History Wendy Sanchez has a past medical history of Allergic rhinitis; Arthritis; Asthma; Complication of anesthesia; Environmental allergies; Fibromyalgia; GERD (gastroesophageal reflux disease); History of bronchitis; Hypertension; Numbness; Status post gastric banding; Tinnitus; and Vertigo.   She has a past surgical history that includes Abdominal hysterectomy (1979); Ganglion cyst excision; Knee surgery (Bilateral); Abdominal hysterectomy; Tonsillectomy; fibrous tissue removed from right shoulder and back of neck ; Total knee arthroplasty (Left, 08/02/2015); Cholecystectomy; Gastric bypass (1980); Total knee arthroplasty (Right, 12/13/2015); and RIGHT/LEFT HEART CATH AND CORONARY ANGIOGRAPHY (Bilateral, 08/12/2016).   Her family history includes Breast cancer in her maternal aunt; Congestive Heart Failure in her brother, daughter, sister, and son; Deep vein thrombosis in her father;  Dementia in her mother; Diabetes in her sister; Hypertension in her father.She reports that she quit smoking about 2 weeks ago. She has a 12.50 pack-year smoking history. She quit smokeless tobacco use about 21 years ago. Her smokeless tobacco use included Snuff and Chew. She reports that she does not drink alcohol or use drugs.  No results found for this or any previous visit.  Tox[employer] Select 13  Date Value Ref Range Status  11/18/2015 FINAL  Final    Comment:    ==================================================================== TOXASSURE SELECT 13 (MW) ==================================================================== Test                             Result       Flag       Units Drug Present not Declared for Prescription Verification   Oxycodone                      33           UNEXPECTED ng/mg creat   Oxymorphone                    638          UNEXPECTED ng/mg creat   Noroxycodone                   262          UNEXPECTED ng/mg creat   Noroxymorphone                 270          UNEXPECTED ng/mg creat    Sources of oxycodone are scheduled prescription medications.    Oxymorphone, noroxycodone, and noroxymorphone are expected  metabolites of oxycodone. Oxymorphone is also available as a    scheduled prescription medication.   Tramadol                       PRESENT      UNEXPECTED   O-Desmethyltramadol            PRESENT      UNEXPECTED   N-Desmethyltramadol            PRESENT      UNEXPECTED    Source of tramadol is a prescription medication.    O-desmethyltramadol and N-desmethyltramadol are expected    metabolites of tramadol. Drug Absent but Declared for Prescription Verification   Hydrocodone                    Not Detected UNEXPECTED ng/mg creat ==================================================================== Test                      Result    Flag   Units      Ref Range   Creatinine              178              mg/dL       >=20 ==================================================================== Declared Medications:  The flagging and interpretation on this report are based on the  following declared medications.  Unexpected results may arise from  inaccuracies in the declared medications.  **Note: The testing scope of this panel includes these medications:  Hydrocodone (Hydrocodone-Acetaminophen)  **Note: The testing scope of this panel does not include following  reported medications:  Acetaminophen (Hydrocodone-Acetaminophen)  Albuterol  Aspirin  Azelastine (Astelin)  Clotrimazole (Lotrimin)  Cyclobenzaprine (Flexeril)  Dexlansoprazole (Dexilant)  Diclofenac (Voltaren)  Eye Drops  Fexofenadine (Allegra)  Fluconazole (Diflucan)  Fluticasone (Advair)  Folic acid (Folvite)  Furosemide (Lasix)  Gabapentin  Hydrochlorothiazide (Maxzide)  Ibuprofen  Levocetirizine (Xyzal)  Metronidazole (Flagyl)  Mirabegron (Myrbetriq)  Montelukast (Singulair)  Mouthwash  Multivitamin  Nortriptyline  Omega-3 Fatty Acids (Lovaza)  Potassium  Ramipril (Altace)  Salmeterol (Advair)  Sucralfate (Carafate)  Supplement (Probiotic)  Triamterene (Maxzide)  Vitamin D2 (Drisdol) ==================================================================== For clinical consultation, please call [phone]. ====================================================================     Outpatient Medications Prior to Visit  Medication Sig Dispense Refill  . albuterol (PROVENTIL HFA;VENTOLIN HFA) 108 (90 Base) MCG/ACT inhaler Inhale 2 puffs into the lungs every 6 (six) hours as needed for wheezing or shortness of breath.    . carvedilol (COREG) 3.125 MG tablet Take 6.25 mg by mouth 2 (two) times daily with a meal.     . clotrimazole (LOTRIMIN) 1 % cream Apply 1 application topically 2 (two) times daily as needed (rash).     . cyclobenzaprine (FLEXERIL) 10 MG tablet Take 1 tablet (10 mg total) by mouth 2 (two) times daily. for  muscle spams (Patient taking differently: Take 10 mg by mouth 3 (three) times daily. for muscle spams) 60 tablet 3  . dexlansoprazole (DEXILANT) 60 MG capsule Take 60 mg by mouth daily.    . diclofenac sodium (VOLTAREN) 1 % GEL Apply 2 g topically 4 (four) times daily as needed (pain).     Marland Kitchen diphenhydrAMINE (BENADRYL) 25 MG tablet Take 50 mg by mouth at bedtime as needed for sleep. Also take 50 mgs by mouth every 4 hours pre-procedural med instruction    . docusate sodium (COLACE) 100 MG capsule Take 200 mg by mouth at bedtime.     Marland Kitchen  fluticasone (FLONASE) 50 MCG/ACT nasal spray Place 1 spray into both nostrils daily.    . Fluticasone-Salmeterol (ADVAIR) 250-50 MCG/DOSE AEPB Inhale 1 puff into the lungs 2 (two) times daily.    . folic acid (FOLVITE) 1 MG tablet Take 1 mg by mouth at bedtime.     . furosemide (LASIX) 40 MG tablet Take 40 mg by mouth daily.     Marland Kitchen gabapentin (NEURONTIN) 300 MG capsule Take 2 capsules (600 mg total) by mouth 3 (three) times daily. (Patient taking differently: Take 600 mg by mouth 2 (two) times daily. May take an additional 600mg  as needed for pain) 180 capsule 1  . levocetirizine (XYZAL) 5 MG tablet Take 5 mg by mouth at bedtime.     . mirabegron ER (MYRBETRIQ) 50 MG TB24 tablet Take 50 mg by mouth daily.    . montelukast (SINGULAIR) 10 MG tablet Take 10 mg by mouth at bedtime.    . Multiple Vitamins-Minerals (ICAPS AREDS 2) CAPS Take 1 capsule by mouth 2 (two) times daily.     . Multiple Vitamins-Minerals (MULTIVITAMIN GUMMIES ADULTS) CHEW Chew 2 tablets by mouth daily.     . nortriptyline (PAMELOR) 50 MG capsule Take 50 mg by mouth at bedtime.    . Omega-3 Fatty Acids (FISH OIL) 1000 MG CAPS Take 1000mg  every 3rd day    . potassium chloride SA (K-DUR,KLOR-CON) 20 MEQ tablet Take 20 meq every third day    . Probiotic Product (PROBIOTIC COLON SUPPORT) CAPS Take 1 capsule by mouth at bedtime.    . ramipril (ALTACE) 10 MG capsule Take 10 mg by mouth daily as needed (high  blood pressure).     . Rivaroxaban (XARELTO) 15 MG TABS tablet Take by mouth.    . sucralfate (CARAFATE) 1 G tablet Take 1 g by mouth 2 (two) times daily as needed (esophageal burning).     . triamterene-hydrochlorothiazide (MAXZIDE) 75-50 MG tablet Take 1 tablet by mouth daily as needed (severe edema).     . ranitidine (ZANTAC) 150 MG tablet Take 150 mg by mouth every 4 (four) hours.    . Vitamin D, Ergocalciferol, (DRISDOL) 50000 UNITS CAPS capsule Take 50,000 Units by mouth every 7 (seven) days. thursdays     No facility-administered medications prior to visit.    Lab Results  Component Value Date   WBC 6.6 12/15/2015   HGB 9.3 (L) 12/15/2015   HCT 28.9 (L) 12/15/2015   PLT 265 12/15/2015   GLUCOSE 108 (H) 12/14/2015   CHOL (H) 03/16/2007    236        ATP III CLASSIFICATION:  <200     mg/dL   Desirable  200-239  mg/dL   Borderline High  >=240    mg/dL   High   TRIG 76 03/16/2007   HDL 71 03/16/2007   LDLCALC (H) 03/16/2007    150        Total Cholesterol/HDL:CHD Risk Coronary Heart Disease Risk Table                     Men   Women  1/2 Average Risk   3.4   3.3   NA 139 12/14/2015   K 3.9 12/14/2015   CL 106 12/14/2015   CREATININE 0.70 08/25/2016   BUN 15 12/14/2015   CO2 27 12/14/2015   INR 1.11 07/26/2015    --------------------------------------------------------------------------------------------------------------------- Ct Angio Chest Pe W Or Wo Contrast  Result Date: 08/25/2016 CLINICAL DATA:  Shortness of breath.  History of DVT. EXAM: CT ANGIOGRAPHY CHEST WITH CONTRAST TECHNIQUE: Multidetector CT imaging of the chest was performed using the standard protocol during bolus administration of intravenous contrast. Multiplanar CT image reconstructions and MIPs were obtained to evaluate the vascular anatomy. CONTRAST:  100 mL Isovue 370 COMPARISON:  None. FINDINGS: Cardiovascular: Satisfactory opacification of the pulmonary arteries to the segmental level. No  evidence of pulmonary embolism. Mild cardiomegaly. No pericardial effusion. Mediastinum/Nodes: No enlarged mediastinal, hilar, or axillary lymph nodes. Thyroid gland, trachea, and esophagus demonstrate no significant findings. Small right Bochdalek's hernia. Lungs/Pleura: Lungs are clear. No pleural effusion or pneumothorax. Mild lingular atelectasis. Upper Abdomen: Prior gastric bypass. No acute upper abdominal abnormality. Small hiatal hernia. Musculoskeletal: No acute osseous abnormality. No lytic or sclerotic osseous lesion. Review of the MIP images confirms the above findings. IMPRESSION: 1. No evidence pulmonary embolus. 2. No thoracic aortic aneurysm. Electronically Signed   By: Kathreen Devoid   On: 08/25/2016 10:53       ---------------------------------------------------------------------------------------------------------------------- Past Medical History:  Diagnosis Date  . Allergic rhinitis   . Arthritis   . Asthma    problems with fumes and aerosols cause asthma  . Complication of anesthesia    awakens during surgery; has occurred with last 3-4 surgeries   . Environmental allergies    fumes   . Fibromyalgia   . GERD (gastroesophageal reflux disease)   . History of bronchitis   . Hypertension   . Numbness    hands bilat when driving; improves when not preforming task   . Status post gastric banding   . Tinnitus    comes and goes   . Vertigo    6 months ago approx     Past Surgical History:  Procedure Laterality Date  . ABDOMINAL HYSTERECTOMY  1979   left ovary remains  . ABDOMINAL HYSTERECTOMY    . CHOLECYSTECTOMY    . fibrous tissue removed from right shoulder and back of neck      2 years ago   . GANGLION CYST EXCISION    . GASTRIC BYPASS  1980   states had bypass with banding and band left in -  . KNEE SURGERY Bilateral    both knees  2001  . RIGHT/LEFT HEART CATH AND CORONARY ANGIOGRAPHY Bilateral 08/12/2016   Procedure: Right/Left Heart Cath and Coronary  Angiography;  Surgeon: Yolonda Kida, MD;  Location: [facility];  Service: Cardiovascular;  Laterality: Bilateral;  . TONSILLECTOMY     age 68  . TOTAL KNEE ARTHROPLASTY Left 08/02/2015   Procedure: LEFT TOTAL KNEE ARTHROPLASTY;  Surgeon: Mcarthur Rossetti, MD;  Location: [facility];  Service: Orthopedics;  Laterality: Left;  . TOTAL KNEE ARTHROPLASTY Right 12/13/2015   Procedure: RIGHT TOTAL KNEE ARTHROPLASTY;  Surgeon: Mcarthur Rossetti, MD;  Location: [facility];  Service: Orthopedics;  Laterality: Right;    Family History  Problem Relation Age of Onset  . Hypertension Father   . Deep vein thrombosis Father   . Dementia Mother   . Diabetes Sister   . Congestive Heart Failure Sister   . Congestive Heart Failure Brother   . Congestive Heart Failure Daughter   . Congestive Heart Failure Son   . Breast cancer Maternal Aunt        great aunt    Social History  Substance Use Topics  . Smoking status: Former Smoker    Packs/day: 0.25    Years: 50.00    Quit date: 08/22/2016  . Smokeless tobacco: Former  User    Types: Snuff, Chew    Quit date: 01/13/1995     Comment: Planned quit date August 28, 2016  . Alcohol use No    ---------------------------------------------------------------------------------------------------------------------  BP (!) 115/98   Pulse (!) 51 Comment: irregular  Temp 98.2 F (36.8 C)   Resp 20   Ht 5\' 7"  (1.702 m)   Wt (!) 317 lb (143.8 kg)   SpO2 100%   BMI 49.65 kg/m    BP Readings from Last 3 Encounters:  09/08/16 (!) 115/98  09/07/16 136/72  08/31/16 (!) 164/70     Wt Readings from Last 3 Encounters:  09/08/16 (!) 317 lb (143.8 kg)  09/07/16 (!) 317 lb (143.8 kg)  08/31/16 (!) 322 lb 9.6 oz (146.3 kg)     ----------------------------------------------------------------------------------------------------------------------  ROS Review of Systems  Cardiac: No angina at rest or shortness of breath Pulmonary: No  dyspnea or air hunger GI: No constipation  Objective:  BP (!) 115/98   Pulse (!) 51 Comment: irregular  Temp 98.2 F (36.8 C)   Resp 20   Ht 5\' 7"  (1.702 m)   Wt (!) 317 lb (143.8 kg)   SpO2 100%   BMI 49.65 kg/m   Physical Exam Heart regular rate and rhythm with occasional ectopy Lungs without rales clear to auscultation otherwise Low back reveals some paraspinous muscle tenderness and pain on extension with lateral rotation leg strength at baseline     Assessment & Plan:   Wendy Sanchez was seen today for knee pain, ankle pain, wrist pain, shoulder pain and headache.  Diagnoses and all orders for this visit:  Chronic pain syndrome  Chronic, continuous use of opioids  Primary osteoarthritis of right shoulder  Chronic pain of right hip  Spinal stenosis of lumbar region with neurogenic claudication  Other orders -     Discontinue: oxyCODONE-acetaminophen (PERCOCET) 7.5-325 MG tablet; Take 1 tablet by mouth every 6 (six) hours as needed for severe pain. -     oxyCODONE-acetaminophen (PERCOCET) 7.5-325 MG tablet; Take 1 tablet by mouth every 6 (six) hours as needed for severe pain.     ----------------------------------------------------------------------------------------------------------------------  Problem List Items Addressed This Visit      Other   Chronic pain syndrome - Primary   Spinal stenosis of lumbar region    Other Visit Diagnoses    Chronic, continuous use of opioids       Primary osteoarthritis of right shoulder       Relevant Medications   ibuprofen (ADVIL,MOTRIN) 800 MG tablet   predniSONE (DELTASONE) 20 MG tablet   oxyCODONE-acetaminophen (PERCOCET) 7.5-325 MG tablet   Chronic pain of right hip          ----------------------------------------------------------------------------------------------------------------------  1. Chronic pain syndrome She seems to be doing well with her current regimen. We will refill this for the next 2  months. She is to return to clinic in 2 months for reevaluation. Continue efforts at weight loss as well as stretching and strengthening  2. Chronic, continuous use of opioids As above we have reviewed the [facility] practitioner database information and it is appropriate.  3. Primary osteoarthritis of right shoulder As above  4. Chronic pain of right hip As above  5. Spinal stenosis of lumbar region with neurogenic claudication She may ultimately be a candidate for an epidural steroid injection as reviewed with her today.    ----------------------------------------------------------------------------------------------------------------------  I am having Wendy Sanchez maintain her dexlansoprazole, sucralfate, ramipril, montelukast, Fluticasone-Salmeterol, furosemide, diclofenac sodium, folic acid, mirabegron ER,  potassium chloride SA, Vitamin D (Ergocalciferol), triamterene-hydrochlorothiazide, clotrimazole, PROBIOTIC COLON SUPPORT, MULTIVITAMIN GUMMIES ADULTS, nortriptyline, levocetirizine, ICAPS AREDS 2, docusate sodium, gabapentin, cyclobenzaprine, albuterol, diphenhydrAMINE, ranitidine, fluticasone, Fish Oil, carvedilol, Rivaroxaban, tiotropium, ibuprofen, predniSONE, and oxyCODONE-acetaminophen.   Meds ordered this encounter  Medications  . tiotropium (SPIRIVA) 18 MCG inhalation capsule    Sig: Place into inhaler and inhale.  . ibuprofen (ADVIL,MOTRIN) 800 MG tablet  . DISCONTD: oxyCODONE-acetaminophen (PERCOCET) 7.5-325 MG tablet  . predniSONE (DELTASONE) 20 MG tablet  . DISCONTD: oxyCODONE-acetaminophen (PERCOCET) 7.5-325 MG tablet    Sig: Take 1 tablet by mouth every 6 (six) hours as needed for severe pain.    Dispense:  120 tablet    Refill:  0    Do not fill until [id number]  . oxyCODONE-acetaminophen (PERCOCET) 7.5-325 MG tablet    Sig: Take 1 tablet by mouth every 6 (six) hours as needed for severe pain.    Dispense:  120 tablet    Refill:  0    Do not fill until  [id number]       Follow-up: No Follow-up on file.    Molli Barrows, MD 1:57 PM  The [place] practitioner database for opioid medications on this patient has been reviewed by me and my staff   Greater than 50% of the total encounter time was spent in counseling and / or coordination of care.     This dictation was performed utilizing Systems analyst.  Please excuse any unintentional or mistaken typographical errors as a result.

## 2016-09-08 NOTE — Progress Notes (Signed)
Nursing Pain Medication Assessment:  Safety precautions to be maintained throughout the outpatient stay will include: orient to surroundings, keep bed in low position, maintain call bell within reach at all times, provide assistance with transfer out of bed and ambulation.  Medication Inspection Compliance: Pill count conducted under aseptic conditions, in front of the patient. Neither the pills nor the bottle was removed from the patient's sight at any time. Once count was completed pills were immediately returned to the patient in their original bottle.  Medication: Oxycodone/APAP Pill/Patch Count: 33 of 120 pills remain Pill/Patch Appearance: Markings consistent with prescribed medication Bottle Appearance: Standard pharmacy container. Clearly labeled. Filled [id number] / 08 / 2018 Last Medication intake:  Today

## 2016-09-09 DIAGNOSIS — I5022 Chronic systolic (congestive) heart failure: Secondary | ICD-10-CM | POA: Diagnosis not present

## 2016-09-09 NOTE — Progress Notes (Signed)
Daily Session Note  Patient Details  Name: OAKLYN JAKUBEK MRN: [id number] Date of Birth: 06-Feb-1950 Referring Provider:     Cardiac [facility]hab [facility]  Referring Provider  Lorrene Reid MD      Encounter Date: 09/09/2016  Check In:     Session Check In - 09/09/16 0753      Check-In   Location [facility]iac & Pul[facility] Re[facility]   Staff Present Alberteen Sam, MA, ACSM RCEP, Exercise Physiologist;Susanne Bice, RN, BSN, CCRP;Treasure [employer] physician immediately available to respond to emergencies See telemetry face sheet for immediately available ER MD   Medication changes reported     Yes   Comments started Creditol twice a day, Furosemide 4m )up from (261m,, Spiriva 1 puff per day.   Fall or balance concerns reported    No   Tobacco Cessation No Change   Warm-up and Cool-down Performed on first and last piece of equipment   Resistance Training Performed Yes   VAD Patient? No     Pain Assessment   Currently in Pain? No/denies   Multiple Pain Sites No         History  Smoking Status  . Former Smoker  . Packs/day: 0.25  . Years: 50.00  . Quit date: 08/22/2016  Smokeless Tobacco  . Former [employer] developer. Types: Snuff, Chew  . Quit date: 01/13/1995    Comment: Planned quit date August 28, 2016    Goals Met:  Independence with exercise equipment Exercise tolerated well No report of cardiac concerns or symptoms Strength training completed today  Goals Unmet:  Not Applicable  Comments: Pt able to follow exercise prescription today without complaint.  Will continue to monitor for progression.   Dr. MaEmily Filberts Medical Director for [facility].

## 2016-09-11 DIAGNOSIS — I5022 Chronic systolic (congestive) heart failure: Secondary | ICD-10-CM | POA: Diagnosis not present

## 2016-09-11 NOTE — Progress Notes (Signed)
Daily Session Note  Patient Details  Name: Wendy Sanchez MRN: [id number] Date of Birth: [birth year] Referring Provider:     Cardiac Rehab from 08/20/2016 in [facility]  Referring Provider  Lorrene Reid MD      Encounter Date: 09/11/2016  Check In:     Session Check In - 09/11/16 0852      Check-In   Location [facility]   Staff Present Gerlene Burdock, RN, BSN;Jessica Luan Pulling, MA, ACSM RCEP, Exercise Physiologist;Mallie Linnemann Oletta Darter, BA, ACSM CEP, Exercise Physiologist   Supervising physician immediately available to respond to emergencies See telemetry face sheet for immediately available ER MD   Medication changes reported     No   Fall or balance concerns reported    No   Warm-up and Cool-down Performed on first and last piece of equipment   Resistance Training Performed Yes   VAD Patient? No     Pain Assessment   Currently in Pain? No/denies         History  Smoking Status  . Former Smoker  . Packs/day: 0.25  . Years: 50.00  . Quit date: 08/22/2016  Smokeless Tobacco  . Former Systems developer  . Types: Snuff, Chew  . Quit date: 01/13/1995    Comment: Planned quit date August 28, 2016    Goals Met:  Independence with exercise equipment Exercise tolerated well No report of cardiac concerns or symptoms Strength training completed today  Goals Unmet:  Not Applicable  Comments: Reviewed RPE scale, THR and program prescription with pt today.  Pt voiced understanding and was given a copy of goals to take home.   Short: Use RPE daily to regulate intensity.  Long: Follow program prescription in THR.    Dr. Emily Filbert is Medical Director for [facility] and [facility].

## 2016-09-16 ENCOUNTER — Encounter: Payer: Medicare Other | Attending: Internal Medicine

## 2016-09-16 ENCOUNTER — Encounter: Payer: Self-pay | Admitting: Dietician

## 2016-09-16 DIAGNOSIS — I5022 Chronic systolic (congestive) heart failure: Secondary | ICD-10-CM

## 2016-09-16 NOTE — Progress Notes (Signed)
Daily Session Note  Patient Details  Name: Wendy Sanchez MRN: [id number] Date of Birth: 05-02-1950 Referring Provider:     Cardiac Rehab from 08/20/2016 in [facility]  Referring Provider  Lorrene Reid MD      Encounter Date: 09/16/2016  Check In:     Session Check In - 09/16/16 0742      Check-In   Location [facility]   Staff Present Alberteen Sam, MA, ACSM RCEP, Exercise Physiologist;Susanne Bice, RN, BSN, CCRP;Hildur Bayer Christain [place], RN BSN   Supervising physician immediately available to respond to emergencies See telemetry face sheet for immediately available ER MD   Medication changes reported     No   Fall or balance concerns reported    No   Warm-up and Cool-down Performed on first and last piece of equipment   Resistance Training Performed Yes   VAD Patient? No     Pain Assessment   Currently in Pain? No/denies   Multiple Pain Sites No         History  Smoking Status  . Former Smoker  . Packs/day: 0.25  . Years: 50.00  . Quit date: 08/22/2016  Smokeless Tobacco  . Former Systems developer  . Types: Snuff, Chew  . Quit date: 01/13/1995    Comment: Planned quit date August 28, 2016    Goals Met:  Independence with exercise equipment Exercise tolerated well No report of cardiac concerns or symptoms Strength training completed today  Goals Unmet:  Not Applicable  Comments: Pt able to follow exercise prescription today without complaint.  Will continue to monitor for progression.   Dr. Emily Filbert is Medical Director for [place] [facility].

## 2016-09-18 ENCOUNTER — Other Ambulatory Visit: Payer: Self-pay | Admitting: Anesthesiology

## 2016-09-18 ENCOUNTER — Encounter: Payer: Medicare Other | Admitting: *Deleted

## 2016-09-18 DIAGNOSIS — I5022 Chronic systolic (congestive) heart failure: Secondary | ICD-10-CM

## 2016-09-18 NOTE — Progress Notes (Signed)
Daily Session Note  Patient Details  Name: Wendy Sanchez MRN: [id number] Date of Birth: May 06, 1950 Referring Provider:     Cardiac Rehab from 08/20/2016 in [facility] Cardiac and Pulmonary Rehab  Referring Provider  Wendy Reid MD      Encounter Date: 09/18/2016  Check In:     Session Check In - 09/18/16 0808      Check-In   Location [facility]-Cardiac & Pulmonary Rehab   Staff Present Wendy Sam, MA, ACSM RCEP, Exercise Physiologist;Wendy Sanchez, BA, ACSM CEP, Exercise Physiologist;Wendy Enterkin, RN, BSN   Supervising physician immediately available to respond to emergencies See telemetry face sheet for immediately available ER MD   Medication changes reported     No   Fall or balance concerns reported    No   Warm-up and Cool-down Performed on first and last piece of equipment   Resistance Training Performed Yes   VAD Patient? No     Pain Assessment   Currently in Pain? No/denies   Multiple Pain Sites No         History  Smoking Status  . Former Smoker  . Packs/day: 0.25  . Years: 50.00  . Quit date: 08/22/2016  Smokeless Tobacco  . Former Systems developer  . Types: Snuff, Chew  . Quit date: 01/13/1995    Comment: Planned quit date August 28, 2016    Goals Met:  Independence with exercise equipment Exercise tolerated well No report of cardiac concerns or symptoms Strength training completed today  Goals Unmet:  Not Applicable  Comments: Pt able to follow exercise prescription today without complaint.  Will continue to monitor for progression. Wendy Sanchez asked [place] to check her blood pressure on both arms today.  She was lower on the right side by 6 mmHg systolic and 10 mmHg on the diastolic.  We encouraged her to talk to her doctor about this.   Dr. Emily Sanchez is Medical Director for [facility] and [facility].

## 2016-09-21 ENCOUNTER — Encounter: Payer: Medicare Other | Admitting: *Deleted

## 2016-09-21 DIAGNOSIS — I5022 Chronic systolic (congestive) heart failure: Secondary | ICD-10-CM | POA: Diagnosis not present

## 2016-09-21 NOTE — Progress Notes (Signed)
Daily Session Note  Patient Details  Name: Wendy Sanchez MRN: [id number] Date of Birth: 1950-03-19 Referring Provider:     Cardiac Rehab from 08/20/2016 in [facility] Cardiac and Pulmonary Rehab  Referring Provider  Lorrene Reid MD      Encounter Date: 09/21/2016  Check In:     Session Check In - 09/21/16 0756      Check-In   Location [facility]-Cardiac & Pulmonary Rehab   Staff Present Earlean Shawl, BS, ACSM CEP, Exercise Physiologist;Carroll Enterkin, RN, Levie Heritage, MA, ACSM RCEP, Exercise Physiologist   Supervising physician immediately available to respond to emergencies See telemetry face sheet for immediately available ER MD   Medication changes reported     No   Fall or balance concerns reported    No   Tobacco Cessation No Change   Warm-up and Cool-down Performed on first and last piece of equipment   Resistance Training Performed Yes   VAD Patient? No     Pain Assessment   Currently in Pain? No/denies   Multiple Pain Sites No         History  Smoking Status  . Former Smoker  . Packs/day: 0.25  . Years: 50.00  . Quit date: 08/22/2016  Smokeless Tobacco  . Former Systems developer  . Types: Snuff, Chew  . Quit date: 01/13/1995    Comment: Planned quit date August 28, 2016    Goals Met:  Independence with exercise equipment Exercise tolerated well No report of cardiac concerns or symptoms Strength training completed today  Goals Unmet:  Not Applicable  Comments: Pt able to follow exercise prescription today without complaint.  Will continue to monitor for progression.    Dr. Emily Filbert is Medical Director for [facility].

## 2016-09-23 ENCOUNTER — Encounter: Payer: Self-pay | Admitting: *Deleted

## 2016-09-23 DIAGNOSIS — I5022 Chronic systolic (congestive) heart failure: Secondary | ICD-10-CM

## 2016-09-23 NOTE — Progress Notes (Signed)
Cardiac Individual Treatment Plan  Patient Details  Name: Wendy Sanchez MRN: [id number] Date of Birth: September 11, 1950 Referring Provider:     Cardiac Rehab from 08/20/2016 in [facility] Cardiac and Pulmonary Rehab  Referring Provider  Lorrene Reid MD      Initial Encounter Date:    Cardiac Rehab from 08/20/2016 in [facility] Cardiac and Pulmonary Rehab  Date  08/20/16  Referring Provider  Lorrene Reid MD      Visit Diagnosis: Heart failure, chronic systolic ([place])  Patient's Home Medications on Admission:  Current Outpatient Prescriptions:  .  albuterol (PROVENTIL HFA;VENTOLIN HFA) 108 (90 Base) MCG/ACT inhaler, Inhale 2 puffs into the lungs every 6 (six) hours as needed for wheezing or shortness of breath., Disp: , Rfl:  .  carvedilol (COREG) 3.125 MG tablet, Take 6.25 mg by mouth 2 (two) times daily with a meal. , Disp: , Rfl:  .  clotrimazole (LOTRIMIN) 1 % cream, Apply 1 application topically 2 (two) times daily as needed (rash). , Disp: , Rfl:  .  cyclobenzaprine (FLEXERIL) 10 MG tablet, Take 1 tablet (10 mg total) by mouth 2 (two) times daily. for muscle spams (Patient taking differently: Take 10 mg by mouth 3 (three) times daily. for muscle spams), Disp: 60 tablet, Rfl: 3 .  dexlansoprazole (DEXILANT) 60 MG capsule, Take 60 mg by mouth daily., Disp: , Rfl:  .  diclofenac sodium (VOLTAREN) 1 % GEL, Apply 2 g topically 4 (four) times daily as needed (pain). , Disp: , Rfl:  .  diphenhydrAMINE (BENADRYL) 25 MG tablet, Take 50 mg by mouth at bedtime as needed for sleep. Also take 50 mgs by mouth every 4 hours pre-procedural med instruction, Disp: , Rfl:  .  docusate sodium (COLACE) 100 MG capsule, Take 200 mg by mouth at bedtime. , Disp: , Rfl:  .  fluticasone (FLONASE) 50 MCG/ACT nasal spray, Place 1 spray into both nostrils daily., Disp: , Rfl:  .  Fluticasone-Salmeterol (ADVAIR) 250-50 MCG/DOSE AEPB, Inhale 1 puff into the lungs 2 (two) times daily., Disp: , Rfl:  .  folic acid (FOLVITE)  1 MG tablet, Take 1 mg by mouth at bedtime. , Disp: , Rfl:  .  furosemide (LASIX) 40 MG tablet, Take 40 mg by mouth daily. , Disp: , Rfl:  .  gabapentin (NEURONTIN) 300 MG capsule, Take 2 capsules (600 mg total) by mouth 3 (three) times daily. (Patient taking differently: Take 600 mg by mouth 2 (two) times daily. May take an additional 652m as needed for pain), Disp: 180 capsule, Rfl: 1 .  ibuprofen (ADVIL,MOTRIN) 800 MG tablet, , Disp: , Rfl:  .  levocetirizine (XYZAL) 5 MG tablet, Take 5 mg by mouth at bedtime. , Disp: , Rfl:  .  mirabegron ER (MYRBETRIQ) 50 MG TB24 tablet, Take 50 mg by mouth daily., Disp: , Rfl:  .  montelukast (SINGULAIR) 10 MG tablet, Take 10 mg by mouth at bedtime., Disp: , Rfl:  .  Multiple Vitamins-Minerals (ICAPS AREDS 2) CAPS, Take 1 capsule by mouth 2 (two) times daily. , Disp: , Rfl:  .  Multiple Vitamins-Minerals (MULTIVITAMIN GUMMIES ADULTS) CHEW, Chew 2 tablets by mouth daily. , Disp: , Rfl:  .  nortriptyline (PAMELOR) 50 MG capsule, Take 50 mg by mouth at bedtime., Disp: , Rfl:  .  Omega-3 Fatty Acids (FISH OIL) 1000 MG CAPS, Take 10025mevery 3rd day, Disp: , Rfl:  .  oxyCODONE-acetaminophen (PERCOCET) 7.5-325 MG tablet, Take 1 tablet by mouth every 6 (six) hours as  needed for severe pain., Disp: 120 tablet, Rfl: 0 .  potassium chloride SA (K-DUR,KLOR-CON) 20 MEQ tablet, Take 20 meq every third day, Disp: , Rfl:  .  predniSONE (DELTASONE) 20 MG tablet, , Disp: , Rfl:  .  Probiotic Product (PROBIOTIC COLON SUPPORT) CAPS, Take 1 capsule by mouth at bedtime., Disp: , Rfl:  .  ramipril (ALTACE) 10 MG capsule, Take 10 mg by mouth daily as needed (high blood pressure). , Disp: , Rfl:  .  ranitidine (ZANTAC) 150 MG tablet, Take 150 mg by mouth every 4 (four) hours., Disp: , Rfl:  .  Rivaroxaban (XARELTO) 15 MG TABS tablet, Take by mouth., Disp: , Rfl:  .  sucralfate (CARAFATE) 1 G tablet, Take 1 g by mouth 2 (two) times daily as needed (esophageal burning). , Disp: ,  Rfl:  .  tiotropium (SPIRIVA) 18 MCG inhalation capsule, Place into inhaler and inhale., Disp: , Rfl:  .  triamterene-hydrochlorothiazide (MAXZIDE) 75-50 MG tablet, Take 1 tablet by mouth daily as needed (severe edema). , Disp: , Rfl:  .  Vitamin D, Ergocalciferol, (DRISDOL) 50000 UNITS CAPS capsule, Take 50,000 Units by mouth every 7 (seven) days. thursdays, Disp: , Rfl:   Past Medical History: Past Medical History:  Diagnosis Date  . Allergic rhinitis   . Arthritis   . Asthma    problems with fumes and aerosols cause asthma  . Complication of anesthesia    awakens during surgery; has occurred with last 3-4 surgeries   . Environmental allergies    fumes   . Fibromyalgia   . GERD (gastroesophageal reflux disease)   . History of bronchitis   . Hypertension   . Numbness    hands bilat when driving; improves when not preforming task   . Status post gastric banding   . Tinnitus    comes and goes   . Vertigo    6 months ago approx     Tobacco Use: History  Smoking Status  . Former Smoker  . Packs/day: 0.25  . Years: 50.00  . Quit date: 08/22/2016  Smokeless Tobacco  . Former Systems developer  . Types: Snuff, Chew  . Quit date: 01/13/1995    Comment: Planned quit date August 28, 2016    Labs: Recent Review Scientist, physiological    Labs for ITP Cardiac and Pulmonary Rehab 03/16/2007   Cholestrol 236 ATP III CLASSIFICATION: <200     mg/dL   Desirable 200-239  mg/dL   Borderline High >=240    mg/dL   High(H)   LDLCALC 150 Total Cholesterol/HDL:CHD Risk Coronary Heart Disease Risk Table Men   Women 1/2 Average Risk   3.4   3.3(H)   HDL 71   Trlycerides 76       Exercise Target Goals:    Exercise Program Goal: Individual exercise prescription set with THRR, safety & activity barriers. Participant demonstrates ability to understand and report RPE using BORG scale, to self-measure pulse accurately, and to acknowledge the importance of the exercise prescription.  Exercise Prescription  Goal: Starting with aerobic activity 30 plus minutes a day, 3 days per week for initial exercise prescription. Provide home exercise prescription and guidelines that participant acknowledges understanding prior to discharge.  Activity Barriers & Risk Stratification:     Activity Barriers & Cardiac Risk Stratification - 08/20/16 1410      Activity Barriers & Cardiac Risk Stratification   Activity Barriers Arthritis;Left Knee Replacement;Right Knee Replacement;Shortness of Breath;Muscular Weakness;Deconditioning;Joint Problems;Fibromyalgia  Knee replacements were done in 2017.  Right shoulder rotator cuff repair, removal of bone spurs and other repair done. Has ganglion on left wrist. Arthritis Right ankle   Cardiac Risk Stratification High      6 Minute Walk:     6 Minute Walk    Row Name 08/20/16 1440         6 Minute Walk   Phase Initial     Distance 830 feet     Walk Time 6 minutes     # of Rest Breaks 1  1:02     MPH 1.9     METS 1.22     RPE 17     Perceived Dyspnea  4     VO2 Peak 4.29     Symptoms Yes (comment)     Comments SOB, right sided chest pain, leg pain 8/10     Resting HR 91 bpm     Resting BP 124/70     Max Ex. HR 118 bpm     Max Ex. BP 128/74     2 Minute Post BP 116/66        Oxygen Initial Assessment:   Oxygen Re-Evaluation:   Oxygen Discharge (Final Oxygen Re-Evaluation):   Initial Exercise Prescription:     Initial Exercise Prescription - 08/20/16 1400      Date of Initial Exercise RX and Referring Provider   Date 08/20/16   Referring Provider Lorrene Reid MD     Treadmill   MPH 1.5   Grade 0   Minutes 15   METs 2.15     Recumbant Elliptical   Level 1   RPM 50   Minutes 15   METs 2     T5 Nustep   Level 1   SPM 80   Minutes 15   METs 2     Prescription Details   Frequency (times per week) 3   Duration Progress to 45 minutes of aerobic exercise without signs/symptoms of physical distress     Intensity   THRR  40-80% of Max Heartrate 117-142   Ratings of Perceived Exertion 11-13   Perceived Dyspnea 0-4     Progression   Progression Continue to progress workloads to maintain intensity without signs/symptoms of physical distress.     Resistance Training   Training Prescription Yes   Weight 2 lbs   Reps 10-15      Perform Capillary Blood Glucose checks as needed.  Exercise Prescription Changes:     Exercise Prescription Changes    Row Name 08/20/16 1300 09/01/16 1500 09/07/16 0900 09/16/16 1500       Response to Exercise   Blood Pressure (Admit) 124/70 126/76  - 146/84    Blood Pressure (Exercise) 128/74 164/70  - 140/70    Blood Pressure (Exit) 116/66 130/70  - 106/58    Heart Rate (Admit) 91 bpm 107 bpm  - 106 bpm    Heart Rate (Exercise) 121 bpm 140 bpm  - 111 bpm    Heart Rate (Exit) 101 bpm 95 bpm  - 104 bpm    Oxygen Saturation (Admit) 100 %  -  -  -    Oxygen Saturation (Exercise) 98 %  -  -  -    Rating of Perceived Exertion (Exercise) 17 15  - 14    Symptoms SOB, right sided chest pain, leg pain 8/10 chest tightness  - back pain    Comments walk test results 2nd full day of exercise  -  -  Duration  - Progress to 45 minutes of aerobic exercise without signs/symptoms of physical distress  - Progress to 45 minutes of aerobic exercise without signs/symptoms of physical distress    Intensity  - THRR unchanged  - THRR unchanged      Progression   Progression  - Continue to progress workloads to maintain intensity without signs/symptoms of physical distress.  - Continue to progress workloads to maintain intensity without signs/symptoms of physical distress.    Average METs  - 1.92  - 1.88      Resistance Training   Training Prescription  - Yes  - Yes    Weight  - 2 lbs  - 2 lbs    Reps  - 10-15  - 10-15      Interval Training   Interval Training  - No  - No      Treadmill   MPH  - 1.5  - 1.5    Grade  - 0  - 0    Minutes  - 15  - 15    METs  - 2.15  - 2.15       Recumbant Elliptical   Level  - 1  - 1.5    Minutes  - 15  - 15    METs  - 1.6  - 1.4      T5 Nustep   Level  - 3  - 5    Minutes  - 15  - 15    METs  - 2  - 2.4      Home Exercise Plan   Plans to continue exercise at  -  - [employer] (comment)  [employer], walking, pool walking [employer] (comment)  [employer], walking, pool walking    Frequency  -  - Add 2 additional days to program exercise sessions. Add 2 additional days to program exercise sessions.    Initial Home Exercises Provided  -  - 09/07/16 09/07/16       Exercise Comments:     Exercise Comments    Row Name 08/26/16 0900 08/31/16 1150 09/16/16 0856       Exercise Comments First full day of exercise!  Patient was oriented to gym and equipment including functions, settings, policies, and procedures.  Patient's individual exercise prescription and treatment plan were reviewed.  All starting workloads were established based on the results of the 6 minute walk test done at initial orientation visit.  The plan for exercise progression was also introduced and progression will be customized based on patient's performance and goals. Wendy Sanchez had "chest tightness" which started when she was walking at 1.51mh TM. BBrandalynreported that she recently had a heart cath with no blockage and that is to see the breathing doctor this week. BDawnettareported her "chest pressure: decreased wtih rest and eventually went away. BShakilaresumed on the treadmill by herself.  BAlmenagot tangled up with the dogs and fell in a hole which hurt her back and left leg. She was able to tolerate exercise today.        Exercise Goals and Review:     Exercise Goals    Row Name 08/20/16 1444             Exercise Goals   Increase Physical Activity Yes       Intervention Provide advice, education, support and counseling about physical activity/exercise needs.;Develop an individualized exercise prescription for aerobic and  resistive training based on initial evaluation findings, risk stratification, comorbidities  and participant's personal goals.       Expected Outcomes Achievement of increased cardiorespiratory fitness and enhanced flexibility, muscular endurance and strength shown through measurements of functional capacity and personal statement of participant.       Increase Strength and Stamina Yes       Intervention Provide advice, education, support and counseling about physical activity/exercise needs.;Develop an individualized exercise prescription for aerobic and resistive training based on initial evaluation findings, risk stratification, comorbidities and participant's personal goals.       Expected Outcomes Achievement of increased cardiorespiratory fitness and enhanced flexibility, muscular endurance and strength shown through measurements of functional capacity and personal statement of participant.          Exercise Goals Re-Evaluation :     Exercise Goals Re-Evaluation    Row Name 09/01/16 1541 09/07/16 0901 09/07/16 0905 09/16/16 1551       Exercise Goal Re-Evaluation   Exercise Goals Review Increase Strenth and Stamina;Increase Physical Activity Knowledge and understanding of Target Heart Rate Range (THRR);Able to check pulse independently;Understanding of Exercise Prescription;Increase Physical Activity;Able to understand and use rate of perceived exertion (RPE) scale Increase Physical Activity;Increase Strength and Stamina;Understanding of Exercise Prescription Increase Physical Activity;Increase Strength and Stamina    Comments Wendy Sanchez is off to a good start in rehab.  She did have some chest tightness on treadmill on her second day of exercise.  We will continue to monitor her progression.   Reviewed home exercise with pt today.  Pt plans to walk and go to [employer] for exercise.  Reviewed THR, pulse, RPE, sign and symptoms, NTG use, and when to call 911 or MD.  Also discussed weather  considerations and indoor options.  Pt voiced understanding. Wendy Sanchez has been doing well in rehab.  She mentioned that her first week she was so sore that she thought she had a UTI. She continues to have low back pain, but moving does make it feel better.  She has already started walking some at home and has made it half way around her pond. She is determined to get all the away around.  Wendy Sanchez continues to do well in rehab.  She pulled a muscle in her back and twisted her ankle over the weekend.  She did not do the treadmill today as a results.  She is makeing some progress.  We will continue to monitor her progression.     Expected Outcomes Short: Work on building up stamina and increasing time on treadmill.  Long: Continue to work on increasing physcial activity.  Short: Continue to walk at home and start to go back to pool at [facility].  Long: Make exercise part of daily routine.  Short: Start to exercise more at home.  Long: Continue to exercise daily. Short: Continue to try to add in exercise at home.   Long: Make exercise part of routine.        Discharge Exercise Prescription (Final Exercise Prescription Changes):     Exercise Prescription Changes - 09/16/16 1500      Response to Exercise   Blood Pressure (Admit) 146/84   Blood Pressure (Exercise) 140/70   Blood Pressure (Exit) 106/58   Heart Rate (Admit) 106 bpm   Heart Rate (Exercise) 111 bpm   Heart Rate (Exit) 104 bpm   Rating of Perceived Exertion (Exercise) 14   Symptoms back pain   Duration Progress to 45 minutes of aerobic exercise without signs/symptoms of physical distress   Intensity THRR unchanged  Progression   Progression Continue to progress workloads to maintain intensity without signs/symptoms of physical distress.   Average METs 1.88     Resistance Training   Training Prescription Yes   Weight 2 lbs   Reps 10-15     Interval Training   Interval Training No     Treadmill   MPH 1.5   Grade 0   Minutes  15   METs 2.15     Recumbant Elliptical   Level 1.5   Minutes 15   METs 1.4     T5 Nustep   Level 5   Minutes 15   METs 2.4     Home Exercise Plan   Plans to continue exercise at [employer] (comment)  [employer], walking, pool walking   Frequency Add 2 additional days to program exercise sessions.   Initial Home Exercises Provided 09/07/16      Nutrition:  Target Goals: Understanding of nutrition guidelines, daily intake of sodium <1573m, cholesterol <2057m calories 30% from fat and 7% or less from saturated fats, daily to have 5 or more servings of fruits and vegetables.  Biometrics:     Pre Biometrics - 08/20/16 1444      Pre Biometrics   Height 5' 7.4" (1.712 m)   Weight (!)  318 lb 14.4 oz (144.7 kg)   Waist Circumference 44.5 inches   Hip Circumference 65 inches   Waist to Hip Ratio 0.68 %   BMI (Calculated) 49.5   Single Leg Stand 1.73 seconds       Nutrition Therapy Plan and Nutrition Goals:     Nutrition Therapy & Goals - 09/16/16 1836      Nutrition Therapy   Diet basic heart healthy     Personal Nutrition Goals   Nutrition Goal eat every 3-4 hours during the day   Personal Goal #2 Include protein with every meal -- try adding some protein drink to foods such as oatmeal or cream of wheat; can also try powdered peanut butter added to foods.    Personal Goal #3 Avoid drinking fluids during meals to have more room for food.   Personal Goal #4 Fluid goal is 64-100oz daily, and this includes all fluids (not just water)   Additional Goals? No   Comments Ms. Downen reports frequently skipping meals, and is unable to eat much at any one time due to gastric band. Provided her with 800-1200kcal bariatric menus for meal ideas and to aid her in meeting basic nutrient needs.      Intervention Plan   Intervention Nutrition handout(s) given to patient.      Nutrition Discharge: Rate Your Plate Scores:     Nutrition Assessments - 08/20/16 1346       MEDFICTS Scores   Pre Score 35      Nutrition Goals Re-Evaluation:     Nutrition Goals Re-Evaluation    Row Name 09/07/16 0850             Goals   Current Weight 317 lb 4.8 oz (143.9 kg)       Nutrition Goal Eat more frequent meals       Comment Scheduled appointment to meet with nutrtitionist on 9/5 at 3pm       Expected Outcome Short: Meet with nutritionist  Long: Find meal plan that works for her.          Nutrition Goals Discharge (Final Nutrition Goals Re-Evaluation):     Nutrition Goals Re-Evaluation - 09/07/16 081660  Goals   Current Weight 317 lb 4.8 oz (143.9 kg)   Nutrition Goal Eat more frequent meals   Comment Scheduled appointment to meet with nutrtitionist on 9/5 at 3pm   Expected Outcome Short: Meet with nutritionist  Long: Find meal plan that works for her.      Psychosocial: Target Goals: Acknowledge presence or absence of significant depression and/or stress, maximize coping skills, provide positive support system. Participant is able to verbalize types and ability to use techniques and skills needed for reducing stress and depression.   Initial Review & Psychosocial Screening:     Initial Psych Review & Screening - 08/20/16 1409      Initial Review   Comments Wendy Sanchez is aso stredd about the fact that her medical team has not been listening to her.  She wants to be heard , evaluated and treated.       Quality of Life Scores:      Quality of Life - 08/20/16 1350      Quality of Life Scores   Health/Function Pre 10.64 %   Socioeconomic Pre 11.44 %   Psych/Spiritual Pre 16.29 %   Family Pre 5.63 %   GLOBAL Pre 10.64 %      PHQ-9: Recent Review Flowsheet Data    Depression screen [facility] 2/9 09/08/2016 09/07/2016 08/20/2016 07/16/2016 05/18/2016   Decreased Interest 0 0 0 0 0   Down, Depressed, Hopeless 0 0 0 0 0   PHQ - 2 Score 0 0 0 0 0   Altered sleeping - 3 3 - -   Tired, decreased energy - 0 2 - -   Change in appetite - 3 3 - -    Feeling bad or failure about yourself  - 0 0 - -   Trouble concentrating - 0 0 - -   Moving slowly or fidgety/restless - 0 0 - -   Suicidal thoughts - 0 0 - -   PHQ-9 Score - 6 8 - -   Difficult doing work/chores - Somewhat difficult Very difficult - -     Interpretation of Total Score  Total Score Depression Severity:  1-4 = Minimal depression, 5-9 = Mild depression, 10-14 = Moderate depression, 15-19 = Moderately severe depression, 20-27 = Severe depression   Psychosocial Evaluation and Intervention:     Psychosocial Evaluation - 08/26/16 0934      Psychosocial Evaluation & Interventions   Interventions Encouraged to exercise with the program and follow exercise prescription;Relaxation education;Stress management education   Comments Counselor met with Wendy Sanchez Wendy Sanchez) for initial psychosocial evaluation.  She is a 66 year old who was recently diagnosed with CHF.  She has multiple health issues with knee replacement surgeries (both) in 2017; bone spurs and rotator cuff surgery in 2015; Arthritis and Fibromyalgia with chronic pain; recurring yeast infections and chronic sleep problems.  She also struggles with being overweight.  Indyah has a strong support system with adult children locally as well as (2) sisters.  She also has limited appetite for the past 6-8 months.  Branden denies a history of depression or anxiety or any current symptoms; although counselor discussed her PHQ-9 score of "8" indicating mild depressive symptoms.  Also her Quality of Life scores are considerably low.  Counselor brought this to Wendy Sanchez's attention and she agreed that her health issues; recent losses of family members; financial stress; and she just quit smoking all are contributing to her lower quality of life currently.  She admits to increased irritability  since quitting smoking but is relying on her strong support system during this time.  Her goals for this program are to improve her health and lose some  weight.  Staff will follow with Wendy Sanchez throughout the course of this program.     Expected Outcomes Wendy Sanchez will benefit from consistent exercise to achieve her stated goals.  The educational and psychoeducational components of this program will help her cope with and manage her current symptoms better.  Meeting with the dietician will also help Wendy Sanchez achieve her stated goals.  Counselor encouraged her to contact her gynecologist concerning her chronic yeast infections.  Staff will follow with Ms. Pedregon.   Continue Psychosocial Services  Follow up required by staff      Psychosocial Re-Evaluation:     Psychosocial Re-Evaluation    [place] Name 08/31/16 1154 09/07/16 0853           Psychosocial Re-Evaluation   Current issues with  - Current Sleep Concerns;Current Stress Concerns      Comments Wendy Sanchez stated she if frustated since the doctors don't seem to listen to her and she feels she gets conflicting information.  Wendy Sanchez is feeling great mentally.  Definitely making improvements!!   She has quit smoking!!! And was congratulated for her continued abstienance.  Her PHQ-9 score is down two points today from when she started. We are heading in the right direction.  Her stressors are her lack of sleep and lack of appeptite.  We scheduled her to meet with nutrtiton to help with diet.  We talked about natural remedies for sleep using melatonin and/or sleepytime teas to help her sleep better.   If these don't help it may be worth looking into a sleep study.  She says that although she is feeling better her support system still thinks she needs to keep improving.      Expected Outcomes  - Short: Try the sleepy time tea and melatonin to sleep better.  Long: Continue to have postive mood and keep moving forward.      Continue Psychosocial Services  Follow up required by staff  -         Psychosocial Discharge (Final Psychosocial Re-Evaluation):     Psychosocial Re-Evaluation - 09/07/16 0853       Psychosocial Re-Evaluation   Current issues with Current Sleep Concerns;Current Stress Concerns   Comments Wendy Sanchez is feeling great mentally.  Definitely making improvements!!   She has quit smoking!!! And was congratulated for her continued abstienance.  Her PHQ-9 score is down two points today from when she started. We are heading in the right direction.  Her stressors are her lack of sleep and lack of appeptite.  We scheduled her to meet with nutrtiton to help with diet.  We talked about natural remedies for sleep using melatonin and/or sleepytime teas to help her sleep better.   If these don't help it may be worth looking into a sleep study.  She says that although she is feeling better her support system still thinks she needs to keep improving.   Expected Outcomes Short: Try the sleepy time tea and melatonin to sleep better.  Long: Continue to have postive mood and keep moving forward.      Vocational Rehabilitation: Provide vocational rehab assistance to qualifying candidates.   Vocational Rehab Evaluation & Intervention:     Vocational Rehab - 08/20/16 1358      Initial Vocational Rehab Evaluation & Intervention   Assessment shows need for Vocational Rehabilitation No  Education: Education Goals: Education classes will be provided on a variety of topics geared toward better understanding of heart health and risk factor modification. Participant will state understanding/return demonstration of topics presented as noted by education test scores.  Learning Barriers/Preferences:     Learning Barriers/Preferences - 08/20/16 1357      Learning Barriers/Preferences   Learning Barriers Sight  Left eye vision at times is blurred   Learning Preferences None      Education Topics: General Nutrition Guidelines/Fats and Fiber: -Group instruction provided by verbal, written material, models and posters to present the general guidelines for heart healthy nutrition. Gives an  explanation and review of dietary fats and fiber.   Cardiac Rehab from 09/23/2016 in [facility] Cardiac and Pulmonary Rehab  Date  08/31/16  Educator  CR      Controlling Sodium/Reading Food Labels: -Group verbal and written material supporting the discussion of sodium use in heart healthy nutrition. Review and explanation with models, verbal and written materials for utilization of the food label.   Cardiac Rehab from 09/23/2016 in [facility] Cardiac and Pulmonary Rehab  Date  09/07/16  Educator  CR  Instruction Review Code  1- Verbalizes Understanding      Exercise Physiology & Risk Factors: - Group verbal and written instruction with models to review the exercise physiology of the cardiovascular system and associated critical values. Details cardiovascular disease risk factors and the goals associated with each risk factor.   Cardiac Rehab from 09/23/2016 in [facility] Cardiac and Pulmonary Rehab  Date  09/16/16  Educator  [facility]  Instruction Review Code  1- Verbalizes Understanding      Aerobic Exercise & Resistance Training: - Gives group verbal and written discussion on the health impact of inactivity. On the components of aerobic and resistive training programs and the benefits of this training and how to safely progress through these programs.   Cardiac Rehab from 09/23/2016 in [facility] Cardiac and Pulmonary Rehab  Date  09/21/16  Educator  [facility]  Instruction Review Code  1- Verbalizes Understanding      Flexibility, Balance, General Exercise Guidelines: - Provides group verbal and written instruction on the benefits of flexibility and balance training programs. Provides general exercise guidelines with specific guidelines to those with heart or lung disease. Demonstration and skill practice provided.   Cardiac Rehab from 09/23/2016 in [facility] Cardiac and Pulmonary Rehab  Date  09/23/16  Educator  [facility]  Instruction Review Code  1- Verbalizes Understanding      Stress Management: - Provides group verbal  and written instruction about the health risks of elevated stress, cause of high stress, and healthy ways to reduce stress.   Depression: - Provides group verbal and written instruction on the correlation between heart/lung disease and depressed mood, treatment options, and the stigmas associated with seeking treatment.   Cardiac Rehab from 09/23/2016 in [facility] Cardiac and Pulmonary Rehab  Date  09/02/16  Educator  [facility]  Instruction Review Code (retired)  2- meets Designer, fashion/clothing & Physiology of the Heart: - Group verbal and written instruction and models provide basic cardiac anatomy and physiology, with the coronary electrical and arterial systems. Review of: AMI, Angina, Valve disease, Heart Failure, Cardiac Arrhythmia, Pacemakers, and the ICD.   Cardiac Procedures: - Group verbal and written instruction to review commonly prescribed medications for heart disease. Reviews the medication, class of the drug, and side effects. Includes the steps to properly store meds and maintain the prescription regimen. (beta blockers  and nitrates)   Cardiac Medications I: - Group verbal and written instruction to review commonly prescribed medications for heart disease. Reviews the medication, class of the drug, and side effects. Includes the steps to properly store meds and maintain the prescription regimen.   Cardiac Medications II: -Group verbal and written instruction to review commonly prescribed medications for heart disease. Reviews the medication, class of the drug, and side effects. (all other drug classes)    Go Sex-Intimacy & Heart Disease, Get SMART - Goal Setting: - Group verbal and written instruction through game format to discuss heart disease and the return to sexual intimacy. Provides group verbal and written material to discuss and apply goal setting through the application of the S.M.A.R.T. Method.   Other Matters of the Heart: - Provides group verbal, written  materials and models to describe Heart Failure, Angina, Valve Disease, Peripheral Artery Disease, and Diabetes in the realm of heart disease. Includes description of the disease process and treatment options available to the cardiac patient.   Exercise & Equipment Safety: - Individual verbal instruction and demonstration of equipment use and safety with use of the equipment.   Cardiac Rehab from 09/23/2016 in [facility] Cardiac and Pulmonary Rehab  Date  08/20/16  Educator  [facility]      Infection Prevention: - Provides verbal and written material to individual with discussion of infection control including proper hand washing and proper equipment cleaning during exercise session.   Cardiac Rehab from 09/23/2016 in [facility] Cardiac and Pulmonary Rehab  Date  08/20/16  Educator  [facility]      Falls Prevention: - Provides verbal and written material to individual with discussion of falls prevention and safety.   Cardiac Rehab from 09/23/2016 in [facility] Cardiac and Pulmonary Rehab  Date  08/20/16  Educator  [facility]  Instruction Review Code (retired)  2- meets goals/outcomes      Diabetes: - Individual verbal and written instruction to review signs/symptoms of diabetes, desired ranges of glucose level fasting, after meals and with exercise. Acknowledge that pre and post exercise glucose checks will be done for 3 sessions at entry of program.   Other: -Provides group and verbal instruction on various topics (see comments)    Knowledge Questionnaire Score:     Knowledge Questionnaire Score - 08/20/16 1358      Knowledge Questionnaire Score   Pre Score 20/28  Reviewed correct responses with Wendy Sanchez today. She verbalized understanding of the responses.      Core Components/Risk Factors/Patient Goals at Admission:     Personal Goals and Risk Factors at Admission - 08/20/16 1412      Core Components/Risk Factors/Patient Goals on Admission    Weight Management Yes;Obesity;Weight Loss   Admit Weight 317 lb  11.2 oz (144.1 kg)   Goal Weight: Short Term 315 lb (142.9 kg)   Goal Weight: Long Term 250 lb (113.4 kg)   Expected Outcomes Short Term: Continue to assess and modify interventions until short term weight is achieved;Long Term: Adherence to nutrition and physical activity/exercise program aimed toward attainment of established weight goal;Weight Loss: Understanding of general recommendations for a balanced deficit meal plan, which promotes 1-2 lb weight loss per week and includes a negative energy balance of [phone] kcal/d;Understanding of distribution of calorie intake throughout the day with the consumption of 4-5 meals/snacks;Understanding recommendations for meals to include 15-35% energy as protein, 25-35% energy from fat, 35-60% energy from carbohydrates, less than 281m of dietary cholesterol, 20-35 gm of total fiber daily   Tobacco  Cessation Yes   Number of packs per day is smoking about 8 a day   correction SB   Intervention Assist the participant in steps to quit. Provide individualized education and counseling about committing to Tobacco Cessation, relapse prevention, and pharmacological support that can be provided by physician.;Advice worker, assist with locating and accessing local/national Quit Smoking programs, and support quit date choice.   Expected Outcomes Short Term: Will demonstrate readiness to quit, by selecting a quit date.;Short Term: Will quit all tobacco product use, adhering to prevention of relapse plan.;Long Term: Complete abstinence from all tobacco products for at least 12 months from quit date.   Improve shortness of breath with ADL's Yes   Intervention Provide education, individualized exercise plan and daily activity instruction to help decrease symptoms of SOB with activities of daily living.   Expected Outcomes Short Term: Achieves a reduction of symptoms when performing activities of daily living.   Increase knowledge of respiratory medications and  ability to use respiratory devices properly  Yes   Intervention Provide education and demonstration as needed of appropriate use of medications, inhalers, and oxygen therapy.   Expected Outcomes Short Term: Achieves understanding of medications use. Understands that oxygen is a medication prescribed by physician. Demonstrates appropriate use of inhaler and oxygen therapy.   Heart Failure Yes   Intervention Provide a combined exercise and nutrition program that is supplemented with education, support and counseling about heart failure. Directed toward relieving symptoms such as shortness of breath, decreased exercise tolerance, and extremity edema.   Expected Outcomes Improve functional capacity of life;Short term: Attendance in program 2-3 days a week with increased exercise capacity. Reported lower sodium intake. Reported increased fruit and vegetable intake. Reports medication compliance.;Short term: Daily weights obtained and reported for increase. Utilizing diuretic protocols set by physician.;Long term: Adoption of self-care skills and reduction of barriers for early signs and symptoms recognition and intervention leading to self-care maintenance.   Hypertension Yes   Intervention Provide education on lifestyle modifcations including regular physical activity/exercise, weight management, moderate sodium restriction and increased consumption of fresh fruit, vegetables, and low fat dairy, alcohol moderation, and smoking cessation.;Monitor prescription use compliance.   Expected Outcomes Short Term: Continued assessment and intervention until BP is < 140/29m HG in hypertensive participants. < 130/842mHG in hypertensive participants with diabetes, heart failure or chronic kidney disease.;Long Term: Maintenance of blood pressure at goal levels.   Lipids Yes   Intervention Provide education and support for participant on nutrition & aerobic/resistive exercise along with prescribed medications to achieve  LDL <7071mHDL >60m45m Expected Outcomes Short Term: Participant states understanding of desired cholesterol values and is compliant with medications prescribed. Participant is following exercise prescription and nutrition guidelines.;Long Term: Cholesterol controlled with medications as prescribed, with individualized exercise RX and with personalized nutrition plan. Value goals: LDL < 70mg60mL > 40 mg.      Core Components/Risk Factors/Patient Goals Review:      Goals and Risk Factor Review    Row Name 09/07/16 0842 09/07/16 1025           Core Components/Risk Factors/Patient Goals Review   Personal Goals Review Tobacco Cessation;Weight Management/Obesity;Improve shortness of breath with ADL's;Increase knowledge of respiratory medications and ability to use respiratory devices properly.;Develop more efficient breathing techniques such as purse lipped breathing and diaphragmatic breathing and practicing self-pacing with activity.;Hypertension;Heart Failure;Lipids  -      Review BarbaCelesbeen doing well in rehab.  Her weight has stayed  fairly steady.  She has been having some swelling recently but has not let her doctor know. When she did talk to her doctor about her symptoms they told her she was drinking too much water.   We talked about the importance of staying on top of her heart failure symptoms.   Her breathing is actually getting better and she is using her PLB.  Her blood pressures have been pretty good.  She is doing well on her meds.   Wendy Sanchez reported that she spoke to her MD about her feet swelling on 8/20 and she said they just said to make sure to take her Furosemide daily. Wendy Sanchez reported that she took her maxide this weekend which is prn since her ankles were more swollen.       Expected Outcomes Short: Talk to doctor about heart failure symptoms.  Long: Continue to work on weight loss.   -         Core Components/Risk Factors/Patient Goals at Discharge (Final Review):       Goals and Risk Factor Review - 09/07/16 1025      Core Components/Risk Factors/Patient Goals Review   Review Wendy Sanchez reported that she spoke to her MD about her feet swelling on 8/20 and she said they just said to make sure to take her Furosemide daily. Wendy Sanchez reported that she took her maxide this weekend which is prn since her ankles were more swollen.       ITP Comments:     ITP Comments    Row Name 08/20/16 1337 08/20/16 1338 08/24/16 0915 08/26/16 0609 08/31/16 0830   ITP Comments Medical review completed today. INitial ITP created for review and changes if required and signature by Dr Sabra Heck.  Diagnosis documentation can be found in [facility] Medical review completed today. INitial ITP created for review and changes if required and signature by Dr Sabra Heck.  Diagnosis documentation can be found in [facility] encounter 08/06/2016 [name] did not complete her rehab session.  Arieon came to class today but did not exercise. She stated that she had a cath last week and a blood clot was found in her groin area. She was put on new meds and was supposed to see Dr. Clayborn Bigness today. She was advised by staff to ask Dr. Clayborn Bigness if he advised her to come back to cardiac rehab based on this new diagnosis. She did not exercise and went straight to Dr. Etta Quill office to speak with him. She was asked to bring back medical clearance prior to coming back to exercise.  30 day review. Continue with ITP unless directed changes per Medical Director review   New to program At the end of [place] told me that she has ankle swelling, Wendy Sanchez had a lot of PVC's even at rest during the 45mnute Nutrition education session which were faxed to Dr. CClayborn Bigness I suggested she stop by/or call  Dr. CMarianna Payment office with a copy of the PVC and tell them fher fee  I also gave BPamala Hurryprinted info about Heart failure and when to call the MD and went over it with her. BAlleshastated she understood and wanted to learn  about her heart. Given the Cardiac Rehab education booklet also.    [place] 08/31/16 1148 08/31/16 1205 09/07/16 1022 09/16/16 0857 09/18/16 0810   ITP Comments BPamala Hurryhad "chest tightness" which started when she was walking at 1.557m TM. BaCherroneported that she recently had a heart cath with no blockage and that  is to see the breathing doctor this week. Krystalynn reported her "chest pressure: decreased wtih rest and eventually went away. Tayana resumed on the treadmill by herself.  Recent heart cath in [facility] "normal coronaries".  I took a copy of the multiple PVC's including [place] to Dr. Etta Quill office. [place] desk staff asked to give it to Dr. Etta Quill nurse who was in a room with a patient. I later checked CARE everywhere and last potassium 2 weeks ago. Did not see a Magnesium or Phosphorus level. No c/o but PVC's last several times inCardiac Rehab.  Tess got tangled up with the dogs and fell in a hole which hurt her back and left leg. She was able to tolerate exercise today. Wendy Sanchez asked [place] to check her blood pressure on both arms today.  She was lower on the right side by 6 mmHg systolic and 10 mmHg on the diastolic.  We encouraged her to talk to her doctor about this.   [place] Name 09/23/16 1102           ITP Comments 30 day review. Continue with ITP unless directed changes per Medical Director review.            Comments:

## 2016-09-23 NOTE — Progress Notes (Signed)
Daily Session Note  Patient Details  Name: Wendy Sanchez MRN: [id number] Date of Birth: [birth year] Referring Provider:     Cardiac Rehab from 08/20/2016 in [facility] Cardiac and Pulmonary Rehab  Referring Provider  Lorrene Reid MD      Encounter Date: 09/23/2016  Check In:     Session Check In - 09/23/16 0752      Check-In   Location [facility]-Cardiac & Pulmonary Rehab   Staff Present Alberteen Sam, MA, ACSM RCEP, Exercise Physiologist;Krista Frederico Hamman, RN BSN;Isadora Delorey Flavia Shipper   Supervising physician immediately available to respond to emergencies See telemetry face sheet for immediately available ER MD   Medication changes reported     No   Fall or balance concerns reported    No   Warm-up and Cool-down Performed on first and last piece of equipment   Resistance Training Performed Yes   VAD Patient? No     Pain Assessment   Currently in Pain? No/denies   Multiple Pain Sites No         History  Smoking Status  . Former Smoker  . Packs/day: 0.25  . Years: 50.00  . Quit date: 08/22/2016  Smokeless Tobacco  . Former Systems developer  . Types: Snuff, Chew  . Quit date: 01/13/1995    Comment: Planned quit date August 28, 2016    Goals Met:  Independence with exercise equipment Exercise tolerated well No report of cardiac concerns or symptoms Strength training completed today  Goals Unmet:  Not Applicable  Comments: Pt able to follow exercise prescription today without complaint.  Will continue to monitor for progression.   Dr. Emily Filbert is Medical Director for [place] and [facility].

## 2016-09-25 ENCOUNTER — Encounter: Payer: Medicare Other | Admitting: *Deleted

## 2016-09-25 DIAGNOSIS — I5022 Chronic systolic (congestive) heart failure: Secondary | ICD-10-CM | POA: Diagnosis not present

## 2016-09-25 NOTE — Progress Notes (Signed)
Daily Session Note  Patient Details  Name: Wendy Sanchez MRN: [id number] Date of Birth: 04/22/1950 Referring Provider:     Cardiac Rehab from 08/20/2016 in [facility] Cardiac and Pulmonary Rehab  Referring Provider  Lorrene Reid MD      Encounter Date: 09/25/2016  Check In:     Session Check In - 09/25/16 0827      Check-In   Location [facility]-Cardiac & Pulmonary Rehab   Staff Present Alberteen Sam, MA, ACSM RCEP, Exercise Physiologist;Amanda Oletta Darter, BA, ACSM CEP, Exercise Physiologist;Meredith Sherryll Burger, RN BSN   Supervising physician immediately available to respond to emergencies See telemetry face sheet for immediately available ER MD   Medication changes reported     No   Fall or balance concerns reported    No   Warm-up and Cool-down Performed on first and last piece of equipment   Resistance Training Performed Yes   VAD Patient? No     Pain Assessment   Currently in Pain? No/denies   Multiple Pain Sites No         History  Smoking Status  . Former Smoker  . Packs/day: 0.25  . Years: 50.00  . Quit date: 08/22/2016  Smokeless Tobacco  . Former Systems developer  . Types: Snuff, Chew  . Quit date: 01/13/1995    Comment: Planned quit date August 28, 2016    Goals Met:  Independence with exercise equipment Exercise tolerated well No report of cardiac concerns or symptoms Strength training completed today  Goals Unmet:  Not Applicable  Comments: Pt able to follow exercise prescription today without complaint.  Will continue to monitor for progression.    Dr. Emily Filbert is Medical Director for [place] and [facility].

## 2016-09-30 DIAGNOSIS — I5022 Chronic systolic (congestive) heart failure: Secondary | ICD-10-CM

## 2016-09-30 NOTE — Progress Notes (Signed)
Daily Session Note  Patient Details  Name: Wendy Sanchez MRN: [id number] Date of Birth: Aug 24, 1950 Referring Provider:     Cardiac Rehab from 08/20/2016 in [facility] Cardiac and Pulmonary Rehab  Referring Provider  Lorrene Reid MD      Encounter Date: 09/30/2016  Check In:     Session Check In - 09/30/16 0740      Check-In   Location [facility]-Cardiac & Pulmonary Rehab   Staff Present Alberteen Sam, MA, ACSM RCEP, Exercise Physiologist;Susanne Bice, RN, BSN, CCRP;Joseph Flavia Shipper   Supervising physician immediately available to respond to emergencies See telemetry face sheet for immediately available ER MD   Medication changes reported     No   Fall or balance concerns reported    No   Warm-up and Cool-down Performed on first and last piece of equipment   Resistance Training Performed Yes   VAD Patient? No     Pain Assessment   Currently in Pain? No/denies   Multiple Pain Sites No         History  Smoking Status  . Former Smoker  . Packs/day: 0.25  . Years: 50.00  . Quit date: 08/22/2016  Smokeless Tobacco  . Former Systems developer  . Types: Snuff, Chew  . Quit date: 01/13/1995    Comment: Planned quit date August 28, 2016    Goals Met:  Independence with exercise equipment Exercise tolerated well No report of cardiac concerns or symptoms Strength training completed today  Goals Unmet:  Not Applicable  Comments: Pt able to follow exercise prescription today without complaint.  Will continue to monitor for progression.   Dr. Emily Filbert is Medical Director for [place] and [facility].

## 2016-10-02 ENCOUNTER — Encounter: Payer: Medicare Other | Admitting: *Deleted

## 2016-10-02 DIAGNOSIS — I5022 Chronic systolic (congestive) heart failure: Secondary | ICD-10-CM | POA: Diagnosis not present

## 2016-10-02 NOTE — Progress Notes (Signed)
Daily Session Note  Patient Details  Name: Wendy Sanchez MRN: [id number] Date of Birth: 04-01-1950 Referring Provider:     Cardiac Rehab from 08/20/2016 in [facility] Cardiac and Pulmonary Rehab  Referring Provider  Lorrene Reid MD      Encounter Date: 10/02/2016  Check In:     Session Check In - 10/02/16 0914      Check-In   Location [facility]-Cardiac & Pulmonary Rehab   Staff Present Alberteen Sam, MA, ACSM RCEP, Exercise Physiologist;Amanda Oletta Darter, BA, ACSM CEP, Exercise Physiologist;Carroll Enterkin, RN, BSN   Supervising physician immediately available to respond to emergencies See telemetry face sheet for immediately available ER MD   Medication changes reported     Yes   Comments 1/2 dose of beta blocker per Dr. Raul Del   Fall or balance concerns reported    No   Warm-up and Cool-down Performed on first and last piece of equipment   Resistance Training Performed Yes   VAD Patient? No     Pain Assessment   Currently in Pain? No/denies   Multiple Pain Sites No         History  Smoking Status  . Former Smoker  . Packs/day: 0.25  . Years: 50.00  . Quit date: 08/22/2016  Smokeless Tobacco  . Former Systems developer  . Types: Snuff, Chew  . Quit date: 01/13/1995    Comment: Planned quit date August 28, 2016    Goals Met:  Independence with exercise equipment Exercise tolerated well No report of cardiac concerns or symptoms Strength training completed today  Goals Unmet:  Not Applicable  Comments: Pt able to follow exercise prescription today without complaint.  Will continue to monitor for progression.    Dr. Emily Filbert is Medical Director for [place] and [facility].

## 2016-10-05 ENCOUNTER — Encounter: Payer: Medicare Other | Admitting: *Deleted

## 2016-10-05 DIAGNOSIS — I5022 Chronic systolic (congestive) heart failure: Secondary | ICD-10-CM

## 2016-10-05 NOTE — Progress Notes (Signed)
Daily Session Note  Patient Details  Name: Wendy Sanchez MRN: [id number] Date of Birth: 01-19-1950 Referring Provider:     Cardiac Rehab from 08/20/2016 in [facility] Cardiac and Pulmonary Rehab  Referring Provider  Lorrene Reid MD      Encounter Date: 10/05/2016  Check In:     Session Check In - 10/05/16 0804      Check-In   Location [facility]-Cardiac & Pulmonary Rehab   Staff Present Alberteen Sam, MA, ACSM RCEP, Exercise Physiologist;Kelly Amedeo Plenty, BS, ACSM CEP, Exercise Physiologist;Carroll Enterkin, RN, BSN   Supervising physician immediately available to respond to emergencies See telemetry face sheet for immediately available ER MD   Medication changes reported     Yes   Fall or balance concerns reported    No   Warm-up and Cool-down Performed on first and last piece of equipment   Resistance Training Performed Yes   VAD Patient? No     Pain Assessment   Currently in Pain? No/denies   Multiple Pain Sites No         History  Smoking Status  . Former Smoker  . Packs/day: 0.25  . Years: 50.00  . Quit date: 08/22/2016  Smokeless Tobacco  . Former Systems developer  . Types: Snuff, Chew  . Quit date: 01/13/1995    Comment: Planned quit date August 28, 2016    Goals Met:  Independence with exercise equipment Exercise tolerated well Personal goals reviewed No report of cardiac concerns or symptoms Strength training completed today  Goals Unmet:  Not Applicable  Comments: Pt able to follow exercise prescription today without complaint.  Will continue to monitor for progression.    Dr. Emily Filbert is Medical Director for [place] and [facility].

## 2016-10-07 DIAGNOSIS — I5022 Chronic systolic (congestive) heart failure: Secondary | ICD-10-CM | POA: Diagnosis not present

## 2016-10-07 NOTE — Progress Notes (Signed)
Daily Session Note  Patient Details  Name: Wendy Sanchez MRN: [id number] Date of Birth: [birth year] Referring Provider:     Cardiac Rehab from 08/20/2016 in [facility] Cardiac and Pulmonary Rehab  Referring Provider  Lorrene Reid MD      Encounter Date: 10/07/2016  Check In:     Session Check In - 10/07/16 0806      Check-In   Location [facility]-Cardiac & Pulmonary Rehab   Staff Present Alberteen Sam, MA, ACSM RCEP, Exercise Physiologist;Susanne Bice, RN, BSN, CCRP;Estephanie Hubbs Flavia Shipper   Supervising physician immediately available to respond to emergencies See telemetry face sheet for immediately available ER MD   Medication changes reported     Yes   Fall or balance concerns reported    No   Warm-up and Cool-down Performed on first and last piece of equipment   Resistance Training Performed Yes   VAD Patient? No     Pain Assessment   Currently in Pain? No/denies   Multiple Pain Sites No         History  Smoking Status  . Former Smoker  . Packs/day: 0.25  . Years: 50.00  . Quit date: 08/22/2016  Smokeless Tobacco  . Former Systems developer  . Types: Snuff, Chew  . Quit date: 01/13/1995    Comment: Planned quit date August 28, 2016    Goals Met:  Independence with exercise equipment Exercise tolerated well No report of cardiac concerns or symptoms Strength training completed today  Goals Unmet:  Not Applicable  Comments: Pt able to follow exercise prescription today without complaint.  Will continue to monitor for progression.   Dr. Emily Filbert is Medical Director for [place] and [facility].

## 2016-10-09 ENCOUNTER — Encounter: Payer: Medicare Other | Admitting: *Deleted

## 2016-10-09 DIAGNOSIS — I5022 Chronic systolic (congestive) heart failure: Secondary | ICD-10-CM

## 2016-10-09 NOTE — Progress Notes (Signed)
Daily Session Note  Patient Details  Name: Wendy Sanchez MRN: [id number] Date of Birth: October 25, 1950 Referring Provider:     Cardiac Rehab from 08/20/2016 in [facility] Cardiac and Pulmonary Rehab  Referring Provider  Lorrene Reid MD      Encounter Date: 10/09/2016  Check In:     Session Check In - 10/09/16 0750      Check-In   Location [facility]-Cardiac & Pulmonary Rehab   Staff Present Darel Hong, RN BSN;Carroll Enterkin, RN, Vickki Hearing, BA, ACSM CEP, Exercise Physiologist;Other  Joellyn Rued, BS EP   Supervising physician immediately available to respond to emergencies See telemetry face sheet for immediately available ER MD   Medication changes reported     No   Fall or balance concerns reported    No   Tobacco Cessation No Change   Warm-up and Cool-down Performed on first and last piece of equipment   Resistance Training Performed Yes   VAD Patient? No     Pain Assessment   Currently in Pain? No/denies   Multiple Pain Sites No         History  Smoking Status  . Former Smoker  . Packs/day: 0.25  . Years: 50.00  . Quit date: 08/22/2016  Smokeless Tobacco  . Former Systems developer  . Types: Snuff, Chew  . Quit date: 01/13/1995    Comment: Planned quit date August 28, 2016    Goals Met:  Independence with exercise equipment Exercise tolerated well No report of cardiac concerns or symptoms Strength training completed today  Goals Unmet:  Not Applicable  Comments: Pt able to follow exercise prescription today without complaint.  Will continue to monitor for progression.    Dr. Emily Filbert is Medical Director for [place] and [facility].

## 2016-10-12 ENCOUNTER — Encounter: Payer: Medicare Other | Attending: Internal Medicine | Admitting: *Deleted

## 2016-10-12 DIAGNOSIS — I5022 Chronic systolic (congestive) heart failure: Secondary | ICD-10-CM | POA: Diagnosis present

## 2016-10-12 NOTE — Progress Notes (Signed)
Daily Session Note  Patient Details  Name: NARIA ABBEY MRN: [id number] Date of Birth: 28-Feb-1950 Referring Provider:     Cardiac Rehab from 08/20/2016 in [facility] Cardiac and Pulmonary Rehab  Referring Provider  Lorrene Reid MD      Encounter Date: 10/12/2016  Check In:     Session Check In - 10/12/16 0755      Check-In   Location [facility]-Cardiac & Pulmonary Rehab   Staff Present Alberteen Sam, MA, ACSM RCEP, Exercise Physiologist;Kelly Amedeo Plenty, BS, ACSM CEP, Exercise Physiologist;Meredith Sherryll Burger, RN BSN   Supervising physician immediately available to respond to emergencies See telemetry face sheet for immediately available ER MD   Medication changes reported     No   Fall or balance concerns reported    No   Tobacco Cessation No Change   Warm-up and Cool-down Performed on first and last piece of equipment   Resistance Training Performed Yes   VAD Patient? No     Pain Assessment   Currently in Pain? No/denies   Multiple Pain Sites No         History  Smoking Status  . Former Smoker  . Packs/day: 0.25  . Years: 50.00  . Quit date: 08/22/2016  Smokeless Tobacco  . Former Systems developer  . Types: Snuff, Chew  . Quit date: 01/13/1995    Comment: Planned quit date August 28, 2016    Goals Met:  Independence with exercise equipment Exercise tolerated well No report of cardiac concerns or symptoms Strength training completed today  Goals Unmet:  Not Applicable  Comments: Pt able to follow exercise prescription today without complaint.  Will continue to monitor for progression.    Dr. Emily Filbert is Medical Director for [place] and [facility].

## 2016-10-13 ENCOUNTER — Ambulatory Visit (INDEPENDENT_AMBULATORY_CARE_PROVIDER_SITE_OTHER): Payer: Medicare Other

## 2016-10-13 ENCOUNTER — Ambulatory Visit (INDEPENDENT_AMBULATORY_CARE_PROVIDER_SITE_OTHER): Payer: Medicare Other | Admitting: Physician Assistant

## 2016-10-13 DIAGNOSIS — Z96653 Presence of artificial knee joint, bilateral: Secondary | ICD-10-CM

## 2016-10-13 NOTE — Progress Notes (Signed)
Office Visit Note   Patient: Wendy Sanchez           Date of Birth: 11-Nov-1950           MRN: [id number] Visit Date: 10/13/2016              Requested by: [name], MD [address] [place], [place] [address] PCP: [name], MD   Assessment & Plan: Visit Diagnoses:  1. Status post bilateral knee replacements     Plan:She will follow up with [place] on an as-needed basis. She is encouraged to continue to work on quad strengthening and overall strength in both knees as her cardiologist and pulmonologist and lower.  Follow-Up Instructions: Return if symptoms worsen or fail to improve.   Orders:  Orders Placed This Encounter  Procedures  . XR Knee 1-2 Views Left  . XR Knee 1-2 Views Right   No orders of the defined types were placed in this encounter.     Procedures: No procedures performed   Clinical Data: No additional findings.   Subjective: Status post bilateral knee replacement  HPI Mr. Corney returns today follow-up 14 months status post left total knee arthroplasty and 10 months status post right total knee arthroplasty. She states that overall both knees are doing well. Unfortunately she was found to have heart failure and is being treated for this. She states that she is no longer able to do pool therapy because she is exercising and she has to wear a heart monitor. Right knee is overall doing very well. Left knee she has some tenderness in her lower leg area but the knee itself is doing well. Review of Systems Please see history of present illness  Objective: Vital Signs: There were no vitals taken for this visit.  Physical Exam  Constitutional: She is oriented to person, place, and time. She appears well-developed and well-nourished. No distress.  Neurological: She is alert and oriented to person, place, and time.  Skin: She is not diaphoretic.  Psychiatric: She has a normal mood and affect.    Ortho Exam Well knees good  range of motion. Surgical incisions are well-healed. No instability valgus varus stressing. No effusion abnormal warmth erythema. Calf supple nontender bilaterally. Left leg over the tibial spine there is an area of tenderness which could represent a varicose vein. Specialty Comments:  No specialty comments available.  Imaging: Xr Knee 1-2 Views Left  Result Date: 10/13/2016 Left knee 2 views: No acute fracture. No hardware failure. Well-seated total knee components.  Xr Knee 1-2 Views Right  Result Date: 10/13/2016 Right knee: No acute fracture. The components are all well seated no hardware failure.    PMFS History: Patient Active Problem List   Diagnosis Date Noted  . Presence of right artificial knee joint 05/18/2016  . Pes anserinus bursitis of left knee 05/18/2016  . Unilateral primary osteoarthritis, right knee 12/13/2015  . Hx of total knee arthroplasty 12/13/2015  . Osteoarthritis of left knee 08/02/2015  . 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 Morbid obesity with BMI of 45.0-49.9, adult ([place]) 07/25/2015  . Spinal stenosis of lumbar region 06/19/2015  . Chronic pain syndrome 10/30/2014   Past Medical History:  Diagnosis Date  . Allergic rhinitis   . Arthritis   . Asthma    problems with fumes and aerosols cause asthma  . Complication of anesthesia    awakens during surgery; has occurred with last 3-4 surgeries   .  Environmental allergies    fumes   . Fibromyalgia   . GERD (gastroesophageal reflux disease)   . History of bronchitis   . Hypertension   . Numbness    hands bilat when driving; improves when not preforming task   . Status post gastric banding   . Tinnitus    comes and goes   . Vertigo    6 months ago approx     Family History  Problem Relation Age of Onset  . Hypertension Father   . Deep vein thrombosis Father   . Dementia Mother   . Diabetes Sister   . Congestive Heart Failure Sister   . Congestive Heart Failure Brother     . Congestive Heart Failure Daughter   . Congestive Heart Failure Son   . Breast cancer Maternal Aunt        great aunt    Past Surgical History:  Procedure Laterality Date  . ABDOMINAL HYSTERECTOMY  1979   left ovary remains  . ABDOMINAL HYSTERECTOMY    . CHOLECYSTECTOMY    . fibrous tissue removed from right shoulder and back of neck      2 years ago   . GANGLION CYST EXCISION    . GASTRIC BYPASS  1980   states had bypass with banding and band left in -  . KNEE SURGERY Bilateral    both knees  2001  . RIGHT/LEFT HEART CATH AND CORONARY ANGIOGRAPHY Bilateral 08/12/2016   Procedure: Right/Left Heart Cath and Coronary Angiography;  Surgeon: Yolonda Kida, MD;  Location: [facility];  Service: Cardiovascular;  Laterality: Bilateral;  . TONSILLECTOMY     age 9  . TOTAL KNEE ARTHROPLASTY Left 08/02/2015   Procedure: LEFT TOTAL KNEE ARTHROPLASTY;  Surgeon: Mcarthur Rossetti, MD;  Location: [facility];  Service: Orthopedics;  Laterality: Left;  . TOTAL KNEE ARTHROPLASTY Right 12/13/2015   Procedure: RIGHT TOTAL KNEE ARTHROPLASTY;  Surgeon: Mcarthur Rossetti, MD;  Location: [facility];  Service: Orthopedics;  Laterality: Right;   Social History   Occupational History  . Not on file.   Social History Main Topics  . Smoking status: Former Smoker    Packs/day: 0.25    Years: 50.00    Quit date: 08/22/2016  . Smokeless tobacco: Former User    Types: Snuff, Sarina Ser    Quit date: 01/13/1995     Comment: Planned quit date August 28, 2016  . Alcohol use No  . Drug use: No  . Sexual activity: Not on file

## 2016-10-14 ENCOUNTER — Encounter: Payer: Medicare Other | Admitting: *Deleted

## 2016-10-14 DIAGNOSIS — I5022 Chronic systolic (congestive) heart failure: Secondary | ICD-10-CM | POA: Diagnosis not present

## 2016-10-14 NOTE — Progress Notes (Signed)
Daily Session Note  Patient Details  Name: Wendy Sanchez MRN: [id number] Date of Birth: 03/05/1950 Referring Provider:     Cardiac Rehab from 08/20/2016 in [facility] Cardiac and Pulmonary Rehab  Referring Provider  Callwood, Dewayne MD      Encounter Date: 10/14/2016  Check In:     Session Check In - 10/14/16 0752      Check-In   Location [facility]-Cardiac & Pulmonary Rehab   Staff Present Mary Jo Abernethy, RN, BSN, MA;Jessica Hawkins, MA, ACSM RCEP, Exercise Physiologist;Joseph Hood RCP,RRT,BSRT   Supervising physician immediately available to respond to emergencies See telemetry face sheet for immediately available ER MD   Medication changes reported     No   Fall or balance concerns reported    No   Tobacco Cessation No Change   Warm-up and Cool-down Performed on first and last piece of equipment   Resistance Training Performed Yes   VAD Patient? No     Pain Assessment   Currently in Pain? No/denies   Multiple Pain Sites No         History  Smoking Status  . Former Smoker  . Packs/day: 0.25  . Years: 50.00  . Quit date: 08/22/2016  Smokeless Tobacco  . Former User  . Types: Snuff, Chew  . Quit date: 01/13/1995    Comment: Planned quit date August 28, 2016    Goals Met:  Independence with exercise equipment Exercise tolerated well No report of cardiac concerns or symptoms Strength training completed today  Goals Unmet:  Not Applicable  Comments: Pt able to follow exercise prescription today without complaint.  Will continue to monitor for progression.    Dr. Mark Miller is Medical Director for [facility] and [facility]. 

## 2016-10-16 ENCOUNTER — Telehealth: Payer: Self-pay | Admitting: *Deleted

## 2016-10-16 DIAGNOSIS — I5022 Chronic systolic (congestive) heart failure: Secondary | ICD-10-CM | POA: Diagnosis not present

## 2016-10-16 NOTE — Progress Notes (Signed)
Daily Session Note  Patient Details  Name: Wendy Sanchez MRN: [id number] Date of Birth: 1950/03/02 Referring Provider:     Cardiac Rehab from 08/20/2016 in [facility] Cardiac and Pulmonary Rehab  Referring Provider  Lorrene Reid MD      Encounter Date: 10/16/2016  Check In:     Session Check In - 10/16/16 0837      Check-In   Location [facility]-Cardiac & Pulmonary Rehab   Staff Present Gerlene Burdock, RN, BSN;Jessica Luan Pulling, MA, ACSM RCEP, Exercise Physiologist;Davona Kinoshita Oletta Darter, BA, ACSM CEP, Exercise Physiologist   Supervising physician immediately available to respond to emergencies See telemetry face sheet for immediately available ER MD   Medication changes reported     Yes   Fall or balance concerns reported    No   Warm-up and Cool-down Performed on first and last piece of equipment   Resistance Training Performed Yes   VAD Patient? No     Pain Assessment   Currently in Pain? No/denies           Exercise Prescription Changes - 10/15/16 1400      Response to Exercise   Blood Pressure (Admit) 132/68   Blood Pressure (Exercise) 136/74   Blood Pressure (Exit) 132/68   Heart Rate (Admit) 87 bpm   Heart Rate (Exercise) 129 bpm   Heart Rate (Exit) 109 bpm   Rating of Perceived Exertion (Exercise) 15   Symptoms back and ankle pain   Duration Continue with 45 min of aerobic exercise without signs/symptoms of physical distress.   Intensity THRR unchanged     Progression   Progression Continue to progress workloads to maintain intensity without signs/symptoms of physical distress.   Average METs 1.95     Resistance Training   Training Prescription Yes   Weight 4 lbs   Reps 10-15     Interval Training   Interval Training No     Treadmill   MPH 1.5   Grade 0   Minutes 15   METs 2.15     Recumbant Elliptical   Level 1.5   Minutes 15   METs 1.5     T5 Nustep   Level 5   Minutes 15   METs 2.2     Home Exercise Plan   Plans to continue exercise at [employer] (comment)  [employer], walking, pool walking   Frequency Add 2 additional days to program exercise sessions.   Initial Home Exercises Provided 09/07/16      History  Smoking Status  . Former Smoker  . Packs/day: 0.25  . Years: 50.00  . Quit date: 08/22/2016  Smokeless Tobacco  . Former Systems developer  . Types: Snuff, Chew  . Quit date: 01/13/1995    Comment: Planned quit date August 28, 2016    Goals Met:  Independence with exercise equipment Exercise tolerated well No report of cardiac concerns or symptoms Strength training completed today  Goals Unmet:  Not Applicable  Comments: Pt able to follow exercise prescription today without complaint.  Will continue to monitor for progression.    Dr. Emily Filbert is Medical Director for [place] and [facility].

## 2016-10-19 ENCOUNTER — Emergency Department: Payer: Medicare Other

## 2016-10-19 ENCOUNTER — Observation Stay
Admission: EM | Admit: 2016-10-19 | Discharge: 2016-10-20 | Disposition: A | Discharge status: Home / Self Care | Payer: Medicare Other | Attending: Internal Medicine | Admitting: Internal Medicine

## 2016-10-19 ENCOUNTER — Encounter: Payer: Self-pay | Admitting: *Deleted

## 2016-10-19 ENCOUNTER — Encounter: Payer: Medicare Other | Admitting: *Deleted

## 2016-10-19 ENCOUNTER — Other Ambulatory Visit: Payer: Self-pay

## 2016-10-19 VITALS — BP 120/70 | HR 130 | Ht 67.4 in | Wt 315.0 lb

## 2016-10-19 DIAGNOSIS — I5022 Chronic systolic (congestive) heart failure: Secondary | ICD-10-CM | POA: Diagnosis not present

## 2016-10-19 DIAGNOSIS — Z91013 Allergy to seafood: Secondary | ICD-10-CM | POA: Insufficient documentation

## 2016-10-19 DIAGNOSIS — G4733 Obstructive sleep apnea (adult) (pediatric): Secondary | ICD-10-CM | POA: Insufficient documentation

## 2016-10-19 DIAGNOSIS — Z87891 Personal history of nicotine dependence: Secondary | ICD-10-CM | POA: Diagnosis not present

## 2016-10-19 DIAGNOSIS — E785 Hyperlipidemia, unspecified: Secondary | ICD-10-CM | POA: Insufficient documentation

## 2016-10-19 DIAGNOSIS — Z9049 Acquired absence of other specified parts of digestive tract: Secondary | ICD-10-CM | POA: Insufficient documentation

## 2016-10-19 DIAGNOSIS — Z79899 Other long term (current) drug therapy: Secondary | ICD-10-CM | POA: Insufficient documentation

## 2016-10-19 DIAGNOSIS — I493 Ventricular premature depolarization: Secondary | ICD-10-CM | POA: Diagnosis not present

## 2016-10-19 DIAGNOSIS — R0789 Other chest pain: Principal | ICD-10-CM | POA: Insufficient documentation

## 2016-10-19 DIAGNOSIS — R0981 Nasal congestion: Secondary | ICD-10-CM | POA: Diagnosis not present

## 2016-10-19 DIAGNOSIS — I110 Hypertensive heart disease with heart failure: Secondary | ICD-10-CM | POA: Insufficient documentation

## 2016-10-19 DIAGNOSIS — R002 Palpitations: Secondary | ICD-10-CM | POA: Diagnosis not present

## 2016-10-19 DIAGNOSIS — K219 Gastro-esophageal reflux disease without esophagitis: Secondary | ICD-10-CM | POA: Insufficient documentation

## 2016-10-19 DIAGNOSIS — J449 Chronic obstructive pulmonary disease, unspecified: Secondary | ICD-10-CM | POA: Diagnosis not present

## 2016-10-19 DIAGNOSIS — Z6841 Body Mass Index (BMI) 40.0 and over, adult: Secondary | ICD-10-CM | POA: Insufficient documentation

## 2016-10-19 DIAGNOSIS — Z91041 Radiographic dye allergy status: Secondary | ICD-10-CM | POA: Insufficient documentation

## 2016-10-19 DIAGNOSIS — R079 Chest pain, unspecified: Secondary | ICD-10-CM

## 2016-10-19 DIAGNOSIS — M797 Fibromyalgia: Secondary | ICD-10-CM | POA: Insufficient documentation

## 2016-10-19 DIAGNOSIS — I429 Cardiomyopathy, unspecified: Secondary | ICD-10-CM | POA: Insufficient documentation

## 2016-10-19 DIAGNOSIS — Z888 Allergy status to other drugs, medicaments and biological substances status: Secondary | ICD-10-CM | POA: Insufficient documentation

## 2016-10-19 DIAGNOSIS — Z9884 Bariatric surgery status: Secondary | ICD-10-CM | POA: Insufficient documentation

## 2016-10-19 DIAGNOSIS — Z96653 Presence of artificial knee joint, bilateral: Secondary | ICD-10-CM | POA: Diagnosis not present

## 2016-10-19 HISTORY — DX: Chest pain, unspecified: R07.9

## 2016-10-19 LAB — TROPONIN I: Troponin I: <0.03 ng/mL (ref <0.03)

## 2016-10-19 LAB — CBC
HEMATOCRIT: 39.9 % (ref 35.0–47.0)
HEMOGLOBIN: 13 g/dL (ref 12.0–16.0)
MCH: 26.7 pg (ref 26.0–34.0)
MCHC: 32.5 g/dL (ref 32.0–36.0)
MCV: 82 fL (ref 80.0–100.0)
Platelets: 262 10*3/uL (ref 150–440)
RBC: 4.86 MIL/uL (ref 3.80–5.20)
RDW: 17.5 % — ABNORMAL HIGH (ref 11.5–14.5)
WBC: 4 10*3/uL (ref 3.6–11.0)

## 2016-10-19 LAB — BASIC METABOLIC PANEL
ANION GAP: 8 (ref 5–15)
BUN: 18 mg/dL (ref 6–20)
CALCIUM: 9.2 mg/dL (ref 8.9–10.3)
CO2: 27 mmol/L (ref 22–32)
Chloride: 104 mmol/L (ref 101–111)
Creatinine, Ser: 1.15 mg/dL — ABNORMAL HIGH (ref 0.44–1.00)
GFR calc Af Amer: 57 mL/min — ABNORMAL LOW (ref >60)
GFR calc non Af Amer: 49 mL/min — ABNORMAL LOW (ref >60)
GLUCOSE: 103 mg/dL — AB (ref 65–99)
Potassium: 4.8 mmol/L (ref 3.5–5.1)
Sodium: 139 mmol/L (ref 135–145)

## 2016-10-19 LAB — BRAIN NATRIURETIC PEPTIDE: B Natriuretic Peptide: 377 pg/mL — ABNORMAL HIGH (ref 0.0–100.0)

## 2016-10-19 LAB — MAGNESIUM: MAGNESIUM: 2.1 mg/dL (ref 1.7–2.4)

## 2016-10-19 MED ORDER — NITROGLYCERIN 0.4 MG SL SUBL: 0.4000 mg | SUBLINGUAL_TABLET | SUBLINGUAL | Status: DC | PRN

## 2016-10-19 MED ORDER — ENOXAPARIN SODIUM 40 MG/0.4ML ~~LOC~~ SOLN
40.0000 mg | Freq: Two times a day (BID) | SUBCUTANEOUS | Status: DC
  Administered 2016-10-19 – 2016-10-20 (×2): 40 mg via SUBCUTANEOUS
  Filled 2016-10-19 (×2): qty 0.4

## 2016-10-19 MED ORDER — RAMIPRIL 10 MG PO CAPS
10.0000 mg | ORAL_CAPSULE | Freq: Every day | ORAL | Status: DC
  Administered 2016-10-20: 10 mg via ORAL
  Filled 2016-10-19: qty 1

## 2016-10-19 MED ORDER — GABAPENTIN 300 MG PO CAPS
600.0000 mg | ORAL_CAPSULE | Freq: Two times a day (BID) | ORAL | Status: DC
  Administered 2016-10-19 – 2016-10-20 (×2): 600 mg via ORAL
  Filled 2016-10-19 (×2): qty 2

## 2016-10-19 MED ORDER — CYCLOBENZAPRINE HCL 10 MG PO TABS
10.0000 mg | ORAL_TABLET | Freq: Three times a day (TID) | ORAL | Status: DC
Start: 2016-10-19 — End: 2016-10-20
  Administered 2016-10-19 – 2016-10-20 (×3): 10 mg via ORAL
  Filled 2016-10-19 (×3): qty 1

## 2016-10-19 MED ORDER — ASPIRIN 81 MG PO CHEW
324.0000 mg | CHEWABLE_TABLET | Freq: Once | ORAL | Status: AC
  Administered 2016-10-19: 324 mg via ORAL
  Filled 2016-10-19: qty 4

## 2016-10-19 MED ORDER — ENOXAPARIN SODIUM 40 MG/0.4ML ~~LOC~~ SOLN: 40.0000 mg | SUBCUTANEOUS | Status: DC

## 2016-10-19 MED ORDER — ALBUTEROL SULFATE (2.5 MG/3ML) 0.083% IN NEBU: 2.5000 mg | INHALATION_SOLUTION | Freq: Four times a day (QID) | RESPIRATORY_TRACT | Status: DC | PRN

## 2016-10-19 MED ORDER — TIOTROPIUM BROMIDE MONOHYDRATE 18 MCG IN CAPS
18.0000 ug | ORAL_CAPSULE | Freq: Every day | RESPIRATORY_TRACT | Status: DC
  Administered 2016-10-20: 18 ug via RESPIRATORY_TRACT
  Filled 2016-10-19: qty 5

## 2016-10-19 MED ORDER — LEVOCETIRIZINE DIHYDROCHLORIDE 5 MG PO TABS: 5.0000 mg | ORAL_TABLET | Freq: Every day | ORAL | Status: DC

## 2016-10-19 MED ORDER — ONDANSETRON HCL 4 MG/2ML IJ SOLN: 4.0000 mg | Freq: Four times a day (QID) | INTRAMUSCULAR | Status: DC | PRN

## 2016-10-19 MED ORDER — MORPHINE SULFATE (PF) 2 MG/ML IV SOLN: 2.0000 mg | INTRAVENOUS | Status: DC | PRN

## 2016-10-19 MED ORDER — ZOLPIDEM TARTRATE 5 MG PO TABS: 5.0000 mg | ORAL_TABLET | Freq: Every evening | ORAL | Status: DC | PRN

## 2016-10-19 MED ORDER — ACETAMINOPHEN 325 MG PO TABS
650.0000 mg | ORAL_TABLET | ORAL | Status: DC | PRN
Start: 2016-10-19 — End: 2016-10-20

## 2016-10-19 MED ORDER — MIRABEGRON ER 50 MG PO TB24
50.0000 mg | ORAL_TABLET | Freq: Every day | ORAL | Status: DC
  Administered 2016-10-20: 50 mg via ORAL
  Filled 2016-10-19 (×2): qty 1

## 2016-10-19 MED ORDER — OXYCODONE-ACETAMINOPHEN 7.5-325 MG PO TABS
1.0000 | ORAL_TABLET | Freq: Four times a day (QID) | ORAL | Status: DC | PRN
  Administered 2016-10-19: 1 via ORAL
  Filled 2016-10-19: qty 1

## 2016-10-19 MED ORDER — FUROSEMIDE 40 MG PO TABS
40.0000 mg | ORAL_TABLET | Freq: Every day | ORAL | Status: DC
  Administered 2016-10-20: 40 mg via ORAL
  Filled 2016-10-19: qty 1

## 2016-10-19 MED ORDER — ASPIRIN EC 325 MG PO TBEC
325.0000 mg | DELAYED_RELEASE_TABLET | Freq: Every day | ORAL | Status: DC
  Administered 2016-10-20: 325 mg via ORAL
  Filled 2016-10-19: qty 1

## 2016-10-19 MED ORDER — MOMETASONE FURO-FORMOTEROL FUM 200-5 MCG/ACT IN AERO
2.0000 | INHALATION_SPRAY | Freq: Two times a day (BID) | RESPIRATORY_TRACT | Status: DC
  Administered 2016-10-19 – 2016-10-20 (×2): 2 via RESPIRATORY_TRACT
  Filled 2016-10-19: qty 8.8

## 2016-10-19 MED ORDER — SUCRALFATE 1 G PO TABS: 1.0000 g | ORAL_TABLET | Freq: Two times a day (BID) | ORAL | Status: DC | PRN

## 2016-10-19 MED ORDER — NITROGLYCERIN 2 % TD OINT
1.0000 [in_us] | TOPICAL_OINTMENT | Freq: Once | TRANSDERMAL | Status: AC
  Administered 2016-10-19: 1 [in_us] via TOPICAL
  Filled 2016-10-19: qty 1

## 2016-10-19 MED ORDER — POTASSIUM CHLORIDE CRYS ER 20 MEQ PO TBCR: 20.0000 meq | EXTENDED_RELEASE_TABLET | ORAL | Status: DC

## 2016-10-19 MED ORDER — ALBUTEROL SULFATE HFA 108 (90 BASE) MCG/ACT IN AERS: 2.0000 | INHALATION_SPRAY | Freq: Four times a day (QID) | RESPIRATORY_TRACT | Status: DC | PRN

## 2016-10-19 MED ORDER — PANTOPRAZOLE SODIUM 40 MG PO TBEC
40.0000 mg | DELAYED_RELEASE_TABLET | Freq: Every day | ORAL | Status: DC
  Administered 2016-10-20: 40 mg via ORAL
  Filled 2016-10-19: qty 1

## 2016-10-19 MED ORDER — MONTELUKAST SODIUM 10 MG PO TABS
10.0000 mg | ORAL_TABLET | Freq: Every day | ORAL | Status: DC
  Administered 2016-10-19: 10 mg via ORAL
  Filled 2016-10-19: qty 1

## 2016-10-19 MED ORDER — DOCUSATE SODIUM 100 MG PO CAPS
200.0000 mg | ORAL_CAPSULE | Freq: Every day | ORAL | Status: DC
  Administered 2016-10-19: 200 mg via ORAL
  Filled 2016-10-19: qty 2

## 2016-10-19 MED ORDER — FLUTICASONE PROPIONATE 50 MCG/ACT NA SUSP
1.0000 | Freq: Every day | NASAL | Status: DC
  Administered 2016-10-20: 1 via NASAL
  Filled 2016-10-19: qty 16

## 2016-10-19 MED ORDER — NORTRIPTYLINE HCL 25 MG PO CAPS
50.0000 mg | ORAL_CAPSULE | Freq: Every day | ORAL | Status: DC
  Administered 2016-10-19: 50 mg via ORAL
  Filled 2016-10-19: qty 2

## 2016-10-19 MED ORDER — CETIRIZINE HCL 10 MG PO TABS
10.0000 mg | ORAL_TABLET | Freq: Every day | ORAL | Status: DC
  Administered 2016-10-19: 10 mg via ORAL
  Filled 2016-10-19: qty 1

## 2016-10-19 MED ORDER — FOLIC ACID 1 MG PO TABS
1.0000 mg | ORAL_TABLET | Freq: Every day | ORAL | Status: DC
  Administered 2016-10-19: 1 mg via ORAL
  Filled 2016-10-19: qty 1

## 2016-10-19 MED ORDER — ALPRAZOLAM 0.25 MG PO TABS: 0.2500 mg | ORAL_TABLET | Freq: Two times a day (BID) | ORAL | Status: DC | PRN

## 2016-10-19 MED ORDER — CARVEDILOL 3.125 MG PO TABS
3.1250 mg | ORAL_TABLET | Freq: Two times a day (BID) | ORAL | Status: DC
  Administered 2016-10-19 – 2016-10-20 (×2): 3.125 mg via ORAL
  Filled 2016-10-19 (×2): qty 1

## 2016-10-19 MED ORDER — RISAQUAD PO CAPS
1.0000 | ORAL_CAPSULE | Freq: Every day | ORAL | Status: DC
  Administered 2016-10-19: 1 via ORAL
  Filled 2016-10-19: qty 1

## 2016-10-19 MED ORDER — IBUPROFEN 400 MG PO TABS
800.0000 mg | ORAL_TABLET | Freq: Four times a day (QID) | ORAL | Status: DC | PRN
  Administered 2016-10-19 (×2): 800 mg via ORAL
  Filled 2016-10-19 (×2): qty 2

## 2016-10-19 MED ORDER — GI COCKTAIL ~~LOC~~
30.0000 mL | Freq: Four times a day (QID) | ORAL | Status: DC | PRN
  Filled 2016-10-19: qty 30

## 2016-10-19 NOTE — ED Triage Notes (Signed)
Pt comes from the cardiac rehab clinic.  Pt was doing exercises and was having a run of PVCs.  Pt was advised to come to the ER to get checked out.  Pt states chest pressure of 4/10.  Pt is A&Ox4.

## 2016-10-19 NOTE — Progress Notes (Signed)
Daily Session Note  Patient Details  Name: Wendy Sanchez MRN: [id number] Date of Birth: 02-06-1950 Referring Provider:     Cardiac Rehab from 08/20/2016 in [facility] Cardiac and Pulmonary Rehab  Referring Provider  Lorrene Reid MD      Encounter Date: 10/19/2016  Check In:     Session Check In - 10/19/16 0806      Check-In   Location [facility]-Cardiac & Pulmonary Rehab   Staff Present Earlean Shawl, BS, ACSM CEP, Exercise Physiologist;Shaia Porath, RN, Levie Heritage, MA, ACSM RCEP, Exercise Physiologist   Supervising physician immediately available to respond to emergencies See telemetry face sheet for immediately available ER MD   Medication changes reported     No   Fall or balance concerns reported    No   Warm-up and Cool-down Performed on first and last piece of equipment   Resistance Training Performed Yes   VAD Patient? No     Pain Assessment   Currently in Pain? No/denies   Multiple Pain Sites No         History  Smoking Status  . Former Smoker  . Packs/day: 0.25  . Years: 50.00  . Quit date: 08/22/2016  Smokeless Tobacco  . Former Systems developer  . Types: Snuff, Chew  . Quit date: 01/13/1995    Comment: Planned quit date August 28, 2016    Goals Met:  Strength training completed today  Goals Unmet:  HR  Comments: 3 beat runs of vtach approx 15 minutes after sitting stretching after exercise.    Dr. Emily Filbert is Medical Director for [place] and [facility].

## 2016-10-19 NOTE — ED Provider Notes (Signed)
[facility] Emergency Department Provider Note       Time seen: ----------------------------------------- 10:11 AM on 10/19/2016 -----------------------------------------     I have reviewed the triage vital signs and the nursing notes.   HISTORY   Chief Complaint Chest Pain    HPI Wendy Sanchez is a 66 y.o. female with a history of chronic systolic heart failure who was in cardiac rehabilitation today and presents to the ED for dysrhythmia. According to report while in cardiac rehabilitation she was having multiple PVCs while in the relaxation phase and it had 3 out of 10 chest pain. According to her and her history she does have occasional PVCs but typically not this many. She states she has not been feeling herself recently. Recent changes in her medicine include decreasing Coreg as well as adding spironolactone.  Past Medical History:  Diagnosis Date  . Allergic rhinitis   . Arthritis   . Asthma    problems with fumes and aerosols cause asthma  . Complication of anesthesia    awakens during surgery; has occurred with last 3-4 surgeries   . Environmental allergies    fumes   . Fibromyalgia   . GERD (gastroesophageal reflux disease)   . History of bronchitis   . Hypertension   . Numbness    hands bilat when driving; improves when not preforming task   . Status post gastric banding   . Tinnitus    comes and goes   . Vertigo    6 months ago approx     Patient Active Problem List   Diagnosis Date Noted  . Presence of right artificial knee joint 05/18/2016  . Pes anserinus bursitis of left knee 05/18/2016  . Unilateral primary osteoarthritis, right knee 12/13/2015  . Hx of total knee arthroplasty 12/13/2015  . Osteoarthritis of left knee 08/02/2015  . 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 Morbid obesity with BMI of 45.0-49.9, adult ([place]) 07/25/2015  . Spinal stenosis of lumbar region 06/19/2015  . Chronic pain syndrome  10/30/2014    Past Surgical History:  Procedure Laterality Date  . ABDOMINAL HYSTERECTOMY  1979   left ovary remains  . ABDOMINAL HYSTERECTOMY    . CHOLECYSTECTOMY    . fibrous tissue removed from right shoulder and back of neck      2 years ago   . GANGLION CYST EXCISION    . GASTRIC BYPASS  1980   states had bypass with banding and band left in -  . KNEE SURGERY Bilateral    both knees  2001  . RIGHT/LEFT HEART CATH AND CORONARY ANGIOGRAPHY Bilateral 08/12/2016   Procedure: Right/Left Heart Cath and Coronary Angiography;  Surgeon: Yolonda Kida, MD;  Location: [facility];  Service: Cardiovascular;  Laterality: Bilateral;  . TONSILLECTOMY     age 86  . TOTAL KNEE ARTHROPLASTY Left 08/02/2015   Procedure: LEFT TOTAL KNEE ARTHROPLASTY;  Surgeon: Mcarthur Rossetti, MD;  Location: [facility];  Service: Orthopedics;  Laterality: Left;  . TOTAL KNEE ARTHROPLASTY Right 12/13/2015   Procedure: RIGHT TOTAL KNEE ARTHROPLASTY;  Surgeon: Mcarthur Rossetti, MD;  Location: [facility];  Service: Orthopedics;  Laterality: Right;    Allergies Iodine; Other; Tape; and Shellfish allergy  Social History Social History  Substance Use Topics  . Smoking status: Former Smoker    Packs/day: 0.25    Years: 50.00    Quit date: 08/22/2016  . Smokeless tobacco: Former Systems developer    Types: Snuff,  Sarina Ser    Quit date: 01/13/1995     Comment: Planned quit date August 28, 2016  . Alcohol use No    Review of Systems Constitutional: Negative for fever. Cardiovascular: positive for chest pain, palpitations Respiratory: Negative for shortness of breath. Gastrointestinal: Negative for abdominal pain, vomiting and diarrhea. Genitourinary: Negative for dysuria. Musculoskeletal: Negative for back pain. Skin: Negative for rash. Neurological: Negative for headaches, positive for generalized weakness  All systems negative/normal/unremarkable except as stated in the  HPI  ____________________________________________   PHYSICAL EXAM:  VITAL SIGNS: ED Triage Vitals  Enc Vitals Group     BP 10/19/16 0945 103/78     Pulse Rate 10/19/16 0945 89     Resp 10/19/16 0945 18     Temp 10/19/16 0945 97.9 F (36.6 C)     Temp Source 10/19/16 0945 Oral     SpO2 10/19/16 0945 99 %     Weight 10/19/16 0955 (!) 315 lb (142.9 kg)     Height 10/19/16 0955 5\' 7"  (1.702 m)     Head Circumference --      Peak Flow --      Pain Score 10/19/16 0948 4     Pain Loc --      Pain Edu? --      Excl. in [place]? --     Constitutional: Alert and oriented. Well appearing and in no distress. Eyes: Conjunctivae are normal. Normal extraocular movements. ENT   Head: Normocephalic and atraumatic.   Nose: No congestion/rhinnorhea.   Mouth/Throat: Mucous membranes are moist.   Neck: No stridor. Cardiovascular: Normal rate, regular rhythm. No murmurs, rubs, or gallops.occasional premature beats are noted Respiratory: Normal respiratory effort without tachypnea nor retractions. Breath sounds are clear and equal bilaterally. No wheezes/rales/rhonchi. Gastrointestinal: Soft and nontender. Normal bowel sounds Musculoskeletal: Nontender with normal range of motion in extremities. No lower extremity tenderness nor edema. Neurologic:  Normal speech and language. No gross focal neurologic deficits are appreciated.  Skin:  Skin is warm, dry and intact. No rash noted. Psychiatric: Mood and affect are normal. Speech and behavior are normal.  ____________________________________________  EKG: Interpreted by me.sinus rhythm with frequent PVCs, rate is 89 bpm, likely atrial enlargement, T wave inversions are noted, normal QT  ____________________________________________  ED COURSE:  Pertinent labs & imaging results that were available during my care of the patient were reviewed by me and considered in my medical decision making (see chart for details). Patient presents for  chest pain and palpitations, we will assess with labs and imaging as indicated.   Procedures ____________________________________________   LABS (pertinent positives/negatives)  Labs Reviewed  BASIC METABOLIC PANEL - Abnormal; Notable for the following:       Result Value   Glucose, Bld 103 (*)    Creatinine, Ser 1.15 (*)    GFR calc non Af Amer 49 (*)    GFR calc Af Amer 57 (*)    All other components within normal limits  CBC - Abnormal; Notable for the following:    RDW 17.5 (*)    All other components within normal limits  TROPONIN I  MAGNESIUM  BRAIN NATRIURETIC PEPTIDE    RADIOLOGY Images were viewed by me  chest x-ray  IMPRESSION: 1. No acute finding. 2. Cardiomegaly. ____________________________________________  DIFFERENTIAL DIAGNOSIS   dysrhythmia, dehydration, likely slight abnormalities, unstable angina, stable angina, PE   FINAL ASSESSMENT AND PLAN  chest pain, palpitations, PVCs   Plan: Patient had presented for chest pain and multiple PVCs  while at cardiac rehabilitation today. Patients labs are reassuring with no specific abnormalities noted. Patients imaging was negative for pulmonary edema. given her chest pain and multiple PVCs while in cardiac rehabilitation, it would be beneficial to observe her in the hospital on telemetry and have cardiology consult on her.   Earleen Newport, MD   Note: This note was generated in part or whole with voice recognition software. Voice recognition is usually quite accurate but there are transcription errors that can and very often do occur. I apologize for any typographical errors that were not detected and corrected.     Earleen Newport, MD 10/19/16 1106

## 2016-10-19 NOTE — Consult Note (Signed)
Reason for Consult: Chest pain Referring Physician: Dr. Bridgett Larsson hospitalist Dr. Bertrum Sol primary Cardiologist [facility]  Wendy Sanchez is an 66 y.o. female.  HPI: Patient 66 year old morbidly obese female with COPD asthma hypertension hyperlipidemia presents with chest pain symptoms. Patient has known congestive heart failure systolic dysfunction nonischemic with systolic cardiomyopathy. Patient's had cardiac catheter which showed no significant coronary disease recently. Patient has undergone heart track and has had episodes of bigeminy palpitations. She's had some shortness of breath and leg edema no blackout spells or syncope. Patient is being considered for possible AICD placement because of nonischemic severe cardiomyopathy. She's complained of reflux symptoms here for routine follow-up since being seen in emergency room  Past Medical History:  Diagnosis Date  . Allergic rhinitis   . Arthritis   . Asthma    problems with fumes and aerosols cause asthma  . Complication of anesthesia    awakens during surgery; has occurred with last 3-4 surgeries   . Environmental allergies    fumes   . Fibromyalgia   . GERD (gastroesophageal reflux disease)   . History of bronchitis   . Hypertension   . Numbness    hands bilat when driving; improves when not preforming task   . Status post gastric banding   . Tinnitus    comes and goes   . Vertigo    6 months ago approx     Past Surgical History:  Procedure Laterality Date  . ABDOMINAL HYSTERECTOMY  1979   left ovary remains  . ABDOMINAL HYSTERECTOMY    . CHOLECYSTECTOMY    . fibrous tissue removed from right shoulder and back of neck      2 years ago   . GANGLION CYST EXCISION    . GASTRIC BYPASS  1980   states had bypass with banding and band left in -  . KNEE SURGERY Bilateral    both knees  2001  . RIGHT/LEFT HEART CATH AND CORONARY ANGIOGRAPHY Bilateral 08/12/2016   Procedure: Right/Left Heart Cath and Coronary Angiography;  Surgeon:  Yolonda Kida, MD;  Location: [facility];  Service: Cardiovascular;  Laterality: Bilateral;  . TONSILLECTOMY     age 63  . TOTAL KNEE ARTHROPLASTY Left 08/02/2015   Procedure: LEFT TOTAL KNEE ARTHROPLASTY;  Surgeon: Mcarthur Rossetti, MD;  Location: [facility];  Service: Orthopedics;  Laterality: Left;  . TOTAL KNEE ARTHROPLASTY Right 12/13/2015   Procedure: RIGHT TOTAL KNEE ARTHROPLASTY;  Surgeon: Mcarthur Rossetti, MD;  Location: [facility];  Service: Orthopedics;  Laterality: Right;    Family History  Problem Relation Age of Onset  . Hypertension Father   . Deep vein thrombosis Father   . Dementia Mother   . Diabetes Sister   . Congestive Heart Failure Sister   . Congestive Heart Failure Brother   . Congestive Heart Failure Daughter   . Congestive Heart Failure Son   . Breast cancer Maternal Aunt        great aunt    Social History:  reports that she quit smoking about 8 weeks ago. She has a 12.50 pack-year smoking history. She quit smokeless tobacco use about 21 years ago. Her smokeless tobacco use included Snuff and Chew. She reports that she does not drink alcohol or use drugs.  Allergies:  Allergies  Allergen Reactions  . Iodine Anaphylaxis and Swelling  . Other     ALLERGY TO METAL - BLACKENS SKIN AND CAUSES A RASH   . Tape Swelling  Skin comes off.  Paper tape is ok  . Shellfish Allergy Nausea And Vomiting    Episode of GI infection after eating clam chowder. Still eats shrimp and other seafood    Medications: I have reviewed the patient's current medications.  Results for orders placed or performed during the hospital encounter of 10/19/16 (from the past 48 hour(s))  Basic metabolic panel     Status: Abnormal   Collection Time: 10/19/16 10:01 AM  Result Value Ref Range   Sodium 139 135 - 145 mmol/L   Potassium 4.8 3.5 - 5.1 mmol/L   Chloride 104 101 - 111 mmol/L   CO2 27 22 - 32 mmol/L   Glucose, Bld 103 (H) 65 - 99 mg/dL   BUN 18 6 - 20  mg/dL   Creatinine, Ser 1.15 (H) 0.44 - 1.00 mg/dL   Calcium 9.2 8.9 - 10.3 mg/dL   GFR calc non Af Amer 49 (L) >60 mL/min   GFR calc Af Amer 57 (L) >60 mL/min    Comment: (NOTE) The eGFR has been calculated using the CKD EPI equation. This calculation has not been validated in all clinical situations. eGFR's persistently <60 mL/min signify possible Chronic Kidney Disease.    Anion gap 8 5 - 15  CBC     Status: Abnormal   Collection Time: 10/19/16 10:01 AM  Result Value Ref Range   WBC 4.0 3.6 - 11.0 K/uL   RBC 4.86 3.80 - 5.20 MIL/uL   Hemoglobin 13.0 12.0 - 16.0 g/dL   HCT 39.9 35.0 - 47.0 %   MCV 82.0 80.0 - 100.0 fL   MCH 26.7 26.0 - 34.0 pg   MCHC 32.5 32.0 - 36.0 g/dL   RDW 17.5 (H) 11.5 - 14.5 %   Platelets 262 150 - 440 K/uL  Troponin I     Status: None   Collection Time: 10/19/16 10:01 AM  Result Value Ref Range   Troponin I <0.03 <0.03 ng/mL  Brain natriuretic peptide     Status: Abnormal   Collection Time: 10/19/16 10:01 AM  Result Value Ref Range   B Natriuretic Peptide 377.0 (H) 0.0 - 100.0 pg/mL  Magnesium     Status: None   Collection Time: 10/19/16 10:01 AM  Result Value Ref Range   Magnesium 2.1 1.7 - 2.4 mg/dL    Dg Chest 2 View  Result Date: 10/19/2016 CLINICAL DATA:  Chest pain EXAM: CHEST  2 VIEW COMPARISON:  01/24/2016 FINDINGS: Chronic cardiomegaly. Stable mediastinal contours. There is no edema, consolidation, effusion, or pneumothorax. Cholecystectomy clips. There is a contour abnormality along the posterior right diaphragm correlating with fatty Bochdalek's hernia on interval chest CT. IMPRESSION: 1. No acute finding. 2. Cardiomegaly. Electronically Signed   By: Monte Fantasia M.D.   On: 10/19/2016 10:28    Review of Systems  Constitutional: Positive for malaise/fatigue.  HENT: Positive for congestion.   Eyes: Negative.   Respiratory: Positive for shortness of breath.   Cardiovascular: Positive for chest pain, palpitations, orthopnea, leg  swelling and PND.  Gastrointestinal: Positive for heartburn.  Genitourinary: Negative.   Musculoskeletal: Positive for joint pain.  Skin: Negative.   Neurological: Negative.   Endo/Heme/Allergies: Negative.   Psychiatric/Behavioral: Negative.    Blood pressure 120/77, pulse 81, temperature 97.7 F (36.5 C), temperature source Oral, resp. rate 18, height _0  (1.702 m), weight (!) 315 lb (142.9 kg), SpO2 99 %. Physical Exam  Nursing note and vitals reviewed. Constitutional: She is oriented to person, place, and time.  She appears well-developed and well-nourished.  HENT:  Head: Normocephalic and atraumatic.  Eyes: Pupils are equal, round, and reactive to light. Conjunctivae and EOM are normal.  Neck: Normal range of motion. Neck supple.  Cardiovascular: Normal rate and regular rhythm.  Exam reveals gallop.   Murmur heard. Respiratory: Effort normal and breath sounds normal.  GI: Soft. Bowel sounds are normal.  Musculoskeletal: Normal range of motion.  Neurological: She is alert and oriented to person, place, and time. She has normal reflexes.  Skin: Skin is warm and dry.  Psychiatric: She has a normal mood and affect.    Assessment/Plan: Chest pain Cardiomyopathy systolic dysfunction Congestive heart failure systolic dysfunction compensated Morbid obesity GERD Asthma COPD Obstructive sleep apnea Hypertension Edema Sinus congestion . Plan Rule out for myocardial infarction Agree with EKG and telemetry as well as troponins Recommend conservative therapy Do not recommend functional study Recommend cardiac catheter Consider discharge patient within 23 hours Have the patient follow-up as an outpatient for atypical chest pain Persue aggressive reflux therapy Recommend weight loss exercise portion control CPAP for sleep study obstructive sleep apnea therapy  Mansa Willers D Shankar Silber 10/19/2016, 1:28 PM

## 2016-10-19 NOTE — ED Notes (Signed)
Unable to give report at this time due to RN not available. 

## 2016-10-19 NOTE — H&P (Signed)
[facility] [place] NAME: Wendy Sanchez    MR#:  [id number]  DATE OF BIRTH:  1950/06/30  DATE OF ADMISSION:  10/19/2016  PRIMARY CARE PHYSICIAN: [name], MD   REQUESTING/REFERRING PHYSICIAN: Earleen Newport, MD  CHIEF COMPLAINT:   Chief Complaint  Patient presents with  . Chest Pain   Chest pain 3 days, worsening today. HISTORY OF PRESENT ILLNESS:  Wendy Sanchez  is a 66 y.o. female with a known history of Chronic systolic CHF, asthma, GERD, hypertension and fibromyalgia. The patient presents in the ED with the above chief complaints. She started to have chest pain in substernal area, which is intermittent dull without radiation. The chest pain is getting worse today, which become constant and heavy pressure-like aching without radiation. The patient also complains of mild wheezing and shortness breath. She said that she had acid reflex 2 days ago but not today. Troponin is normal. She was given aspirin 325 mg 1 dose in the ED.  PAST MEDICAL HISTORY:   Past Medical History:  Diagnosis Date  . Allergic rhinitis   . Arthritis   . Asthma    problems with fumes and aerosols cause asthma  . Complication of anesthesia    awakens during surgery; has occurred with last 3-4 surgeries   . Environmental allergies    fumes   . Fibromyalgia   . GERD (gastroesophageal reflux disease)   . History of bronchitis   . Hypertension   . Numbness    hands bilat when driving; improves when not preforming task   . Status post gastric banding   . Tinnitus    comes and goes   . Vertigo    6 months ago approx     PAST SURGICAL HISTORY:   Past Surgical History:  Procedure Laterality Date  . ABDOMINAL HYSTERECTOMY  1979   left ovary remains  . ABDOMINAL HYSTERECTOMY    . CHOLECYSTECTOMY    . fibrous tissue removed from right shoulder and back of neck      2 years ago   . GANGLION CYST EXCISION    . GASTRIC BYPASS  1980   states  had bypass with banding and band left in -  . KNEE SURGERY Bilateral    both knees  2001  . RIGHT/LEFT HEART CATH AND CORONARY ANGIOGRAPHY Bilateral 08/12/2016   Procedure: Right/Left Heart Cath and Coronary Angiography;  Surgeon: Yolonda Kida, MD;  Location: [facility];  Service: Cardiovascular;  Laterality: Bilateral;  . TONSILLECTOMY     age 77  . TOTAL KNEE ARTHROPLASTY Left 08/02/2015   Procedure: LEFT TOTAL KNEE ARTHROPLASTY;  Surgeon: Mcarthur Rossetti, MD;  Location: [facility];  Service: Orthopedics;  Laterality: Left;  . TOTAL KNEE ARTHROPLASTY Right 12/13/2015   Procedure: RIGHT TOTAL KNEE ARTHROPLASTY;  Surgeon: Mcarthur Rossetti, MD;  Location: [facility];  Service: Orthopedics;  Laterality: Right;    SOCIAL HISTORY:   Social History  Substance Use Topics  . Smoking status: Former Smoker    Packs/day: 0.25    Years: 50.00    Quit date: 08/22/2016  . Smokeless tobacco: Former User    Types: Snuff, Sarina Ser    Quit date: 01/13/1995     Comment: Planned quit date August 28, 2016  . Alcohol use No    FAMILY HISTORY:   Family History  Problem Relation Age of Onset  . Hypertension Father   . Deep vein thrombosis Father   .  Dementia Mother   . Diabetes Sister   . Congestive Heart Failure Sister   . Congestive Heart Failure Brother   . Congestive Heart Failure Daughter   . Congestive Heart Failure Son   . Breast cancer Maternal Aunt        great aunt    DRUG ALLERGIES:   Allergies  Allergen Reactions  . Iodine Anaphylaxis and Swelling  . Other     ALLERGY TO METAL - BLACKENS SKIN AND CAUSES A RASH   . Tape Swelling    Skin comes off.  Paper tape is ok  . Shellfish Allergy Nausea And Vomiting    Episode of GI infection after eating clam chowder. Still eats shrimp and other seafood    REVIEW OF SYSTEMS:   Review of Systems  Constitutional: Negative for chills, fever and malaise/fatigue.  HENT: Negative for sore throat.   Eyes: Negative for  blurred vision and double vision.  Respiratory: Negative for cough, hemoptysis, shortness of breath, wheezing and stridor.   Cardiovascular: Positive for chest pain. Negative for palpitations, orthopnea and leg swelling.  Gastrointestinal: Negative for abdominal pain, blood in stool, diarrhea, melena, nausea and vomiting.  Genitourinary: Negative for dysuria, flank pain and hematuria.  Musculoskeletal: Negative for back pain and joint pain.  Skin: Negative for rash.  Neurological: Negative for dizziness, sensory change, focal weakness, seizures, loss of consciousness, weakness and headaches.  Endo/Heme/Allergies: Negative for polydipsia.  Psychiatric/Behavioral: Negative for depression. The patient is not nervous/anxious.     MEDICATIONS AT HOME:   Prior to Admission medications   Medication Sig Start Date End Date Taking? Authorizing Provider  carvedilol (COREG) 3.125 MG tablet Take 3.125 mg by mouth 2 (two) times daily with a meal.  09/29/16 09/29/17 Yes [provider]  cyclobenzaprine (FLEXERIL) 10 MG tablet Take 1 tablet (10 mg total) by mouth 2 (two) times daily. for muscle spams Patient taking differently: Take 10 mg by mouth 3 (three) times daily. for muscle spams 07/16/16  Yes Molli Barrows, MD  dexlansoprazole (DEXILANT) 60 MG capsule Take 60 mg by mouth daily.   Yes [provider]  docusate sodium (COLACE) 100 MG capsule Take 200 mg by mouth at bedtime.    Yes [provider]  fluticasone (FLONASE) 50 MCG/ACT nasal spray Place 1 spray into both nostrils daily.   Yes [provider]  Fluticasone-Salmeterol (ADVAIR) 250-50 MCG/DOSE AEPB Inhale 1 puff into the lungs 2 (two) times daily.   Yes [provider]  folic acid (FOLVITE) 1 MG tablet Take 1 mg by mouth at bedtime.    Yes [provider]  furosemide (LASIX) 40 MG tablet Take 40 mg by mouth daily.    Yes [provider]  gabapentin (NEURONTIN) 300 MG capsule Take 2  capsules (600 mg total) by mouth 3 (three) times daily. Patient taking differently: Take 600 mg by mouth 2 (two) times daily. May take an additional 600mg  as needed for pain 07/16/16  Yes Molli Barrows, MD  levocetirizine (XYZAL) 5 MG tablet Take 5 mg by mouth at bedtime.    Yes [provider]  mirabegron ER (MYRBETRIQ) 50 MG TB24 tablet Take 50 mg by mouth daily.   Yes [provider]  montelukast (SINGULAIR) 10 MG tablet Take 10 mg by mouth at bedtime.   Yes [provider]  Multiple Vitamins-Minerals (ICAPS AREDS 2) CAPS Take 1 capsule by mouth 2 (two) times daily.    Yes [provider]  Multiple Vitamins-Minerals (  MULTIVITAMIN GUMMIES ADULTS) CHEW Chew 2 tablets by mouth daily.    Yes [provider]  nortriptyline (PAMELOR) 50 MG capsule Take 50 mg by mouth at bedtime.   Yes [provider]  Omega-3 Fatty Acids (FISH OIL) 1000 MG CAPS Take 1000mg  every 3rd day   Yes [provider]  potassium chloride SA (K-DUR,KLOR-CON) 20 MEQ tablet Take 20 mEq by mouth every other day. Take 20 meq every third day   Yes [provider]  Probiotic Product (PROBIOTIC COLON SUPPORT) CAPS Take 1 capsule by mouth at bedtime.   Yes [provider]  ramipril (ALTACE) 10 MG capsule Take 10 mg by mouth daily.    Yes [provider]  tiotropium (SPIRIVA) 18 MCG inhalation capsule Place into inhaler and inhale. 09/04/16 09/04/17 Yes [provider]  Vitamin D, Ergocalciferol, (DRISDOL) 50000 UNITS CAPS capsule Take 50,000 Units by mouth every 7 (seven) days. thursdays   Yes [provider]  albuterol (PROVENTIL HFA;VENTOLIN HFA) 108 (90 Base) MCG/ACT inhaler Inhale 2 puffs into the lungs every 6 (six) hours as needed for wheezing or shortness of breath.    [provider]  clotrimazole (LOTRIMIN) 1 % cream Apply 1 application topically 2 (two) times daily as needed (rash).     [provider]    diclofenac sodium (VOLTAREN) 1 % GEL Apply 2 g topically 4 (four) times daily as needed (pain).     [provider]  ibuprofen (ADVIL,MOTRIN) 800 MG tablet 800 mg every 8 (eight) hours as needed.  08/06/16   [provider]  oxyCODONE-acetaminophen (PERCOCET) 7.5-325 MG tablet Take 1 tablet by mouth every 6 (six) hours as needed for severe pain. 09/08/16   Molli Barrows, MD  sucralfate (CARAFATE) 1 G tablet Take 1 g by mouth 2 (two) times daily as needed (esophageal burning).     [provider]      VITAL SIGNS:  Blood pressure (!) 128/99, pulse 77, temperature 97.9 F (36.6 C), temperature source Oral, resp. rate 14, height 5\' 7"  (1.702 m), weight (!) 315 lb (142.9 kg), SpO2 98 %.  PHYSICAL EXAMINATION:  Physical Exam  GENERAL:  66 y.o.-year-old patient lying in the bed with no acute distress. Morbid obesity. EYES: Pupils equal, round, reactive to light and accommodation. No scleral icterus. Extraocular muscles intact.  HEENT: Head atraumatic, normocephalic. Oropharynx and nasopharynx clear.  NECK:  Supple, no jugular venous distention. No thyroid enlargement, no tenderness.  LUNGS: Normal breath sounds bilaterally, tiny expiratory wheezing, no rales,rhonchi or crepitation. No use of accessory muscles of respiration.  CARDIOVASCULAR: S1, S2 normal. No murmurs, rubs, or gallops.  ABDOMEN: Soft, nontender, nondistended. Bowel sounds present. No organomegaly or mass.  EXTREMITIES: Trace ankle edema, no cyanosis, or clubbing.  NEUROLOGIC: Cranial nerves II through XII are intact. Muscle strength 5/5 in all extremities. Sensation intact. Gait not checked.  PSYCHIATRIC: The patient is alert and oriented x 3.  SKIN: No obvious rash, lesion, or ulcer.   LABORATORY PANEL:   CBC  Recent Labs Lab 10/19/16 1001  WBC 4.0  HGB 13.0  HCT 39.9  PLT 262    ------------------------------------------------------------------------------------------------------------------  Chemistries   Recent Labs Lab 10/19/16 1001  NA 139  K 4.8  CL 104  CO2 27  GLUCOSE 103*  BUN 18  CREATININE 1.15*  CALCIUM 9.2  MG 2.1   ------------------------------------------------------------------------------------------------------------------  Cardiac Enzymes  Recent Labs Lab 10/19/16 1001  TROPONINI <0.03   ------------------------------------------------------------------------------------------------------------------  RADIOLOGY:  Dg Chest  2 View  Result Date: 10/19/2016 CLINICAL DATA:  Chest pain EXAM: CHEST  2 VIEW COMPARISON:  01/24/2016 FINDINGS: Chronic cardiomegaly. Stable mediastinal contours. There is no edema, consolidation, effusion, or pneumothorax. Cholecystectomy clips. There is a contour abnormality along the posterior right diaphragm correlating with fatty Bochdalek's hernia on interval chest CT. IMPRESSION: 1. No acute finding. 2. Cardiomegaly. Electronically Signed   By: Monte Fantasia M.D.   On: 10/19/2016 10:28      IMPRESSION AND PLAN:   Chest pain, rule out ACS. The patient will be placed for observation. The patient was treated with aspirin 325 mg 1 dose in the ED, continue aspirin, and Lipitor, nitroglycerin when necessary, follow-up troponin level and a cardiology consult.  History of chronic systolic CHF. Stable, continue Lasix.  Hypertension. Continue hypertension medication. GERD. Continue Protonix and Carafate. Asthma. Stable. Continue home medication. Morbid obesity.  All the records are reviewed and case discussed with ED provider. Management plans discussed with the patient, family and they are in agreement.  CODE STATUS: Full code  TOTAL TIME TAKING CARE OF THIS PATIENT: 48 minutes.    Demetrios Loll M.D on 10/19/2016 at 11:50 AM  Between 7am to 6pm - Pager - [phone]  After 6pm go to  [email or web address] - Proofreader  [facility]  Office  [phone]  CC: Primary care physician; [name], MD   Note: This dictation was prepared with Dragon dictation along with smaller phrase technology. Any transcriptional errors that result from this process are unin

## 2016-10-19 NOTE — Progress Notes (Signed)
Pt was placed on CPAP as ordered.

## 2016-10-19 NOTE — ED Notes (Signed)
Irregular heart heart rate, from cardiac rehab , escorted by RN from cardiac rehab

## 2016-10-19 NOTE — Progress Notes (Signed)
Daily Session Note  Patient Details  Name: Wendy Sanchez MRN: [id number] Date of Birth: 1950/08/02 Referring Provider:     Cardiac Rehab from 08/20/2016 in [facility]  Referring Provider  Lorrene Reid MD      Encounter Date: 10/19/2016  Check In:     Session Check In - 10/19/16 0806      Check-In   Location [facility]Card[facility] & Pul[facility]   Staff Present Earlean Shawl, BS, ACSM CEP, Exercise Physiologist;Carroll Enterkin, RN, Levie Heritage, MA, ACSM RCEP, Exercise Physiologist   Supervising physician immediately available to respond to emergencies See telemetry face sheet for immediately available ER MD   Medication changes reported     No   Fall or balance concerns reported    No   Warm-up and Cool-down Performed on first and last piece of equipment   Resistance Training Performed Yes   VAD Patient? No     Pain Assessment   Currently in Pain? No/denies   Multiple Pain Sites No         History  Smoking Status  . Former Smoker  . Packs/day: 0.25  . Years: 50.00  . Quit date: 08/22/2016  Smokeless Tobacco  . Former Systems developer  . Types: Snuff, Chew  . Quit date: 01/13/1995    Comment: Planned quit date August 28, 2016    Goals Met:  Independence with exercise equipment Exercise tolerated well No report of cardiac concerns or symptoms Strength training completed today  Personal goals reviewed  Goals Unmet:  Not Applicable  Comments: Pt able to follow exercise prescription today without complaint.  Will continue to monitor for progression.      [place] Name 08/20/16 1440 10/19/16 0859       6 Minute Walk   Phase Initial Discharge    Distance 830 feet 1025 feet    Distance % Change  - 23 %    Distance Feet Change  - 195 ft    Walk Time 6 minutes 6 minutes    # of Rest Breaks 1  1:02 0    MPH 1.9 1.94    METS 1.22 1.83    RPE 17 15    Perceived Dyspnea  4 2    VO2 Peak 4.29 6.42    Symptoms Yes (comment) Yes  (comment)    Comments SOB, right sided chest pain, leg pain 8/10 Left leg pain during last 3 laps (10/10) resolved with rest    Resting HR 91 bpm 78 bpm    Resting BP 124/70 124/60    Exercise Oxygen Saturation  during 6 min walk  - 100 %    Max Ex. HR 118 bpm 132 bpm    Max Ex. BP 128/74 138/68    2 Minute Post BP 116/66  -          Dr. Emily Filbert is Medical Director for [facility] and [facility].

## 2016-10-20 ENCOUNTER — Encounter: Payer: Self-pay | Admitting: *Deleted

## 2016-10-20 LAB — HIV ANTIBODY (ROUTINE TESTING W REFLEX): HIV Screen 4th Generation wRfx: NONREACTIVE

## 2016-10-20 MED ORDER — ASPIRIN 325 MG PO TBEC: 325.0000 mg | DELAYED_RELEASE_TABLET | Freq: Every day | ORAL | 0 refills | Status: DC

## 2016-10-20 NOTE — Progress Notes (Signed)
Patient is discharge home in a stable condition, summary and f/u care given , verbalized understanding,

## 2016-10-20 NOTE — Discharge Summary (Signed)
[facility] [place] at [facility], 66 y.o., DOB 10/26/1950, MRN [id number]. Admission date: 10/19/2016 Discharge Date 10/20/2016 Primary MD Wendy Pink, MD Admitting Physician Demetrios Loll, MD  Admission Diagnosis  PVC's (premature ventricular contractions) [I49.3] Nonspecific chest pain [R07.9]  Discharge Diagnosis   Active Problems:   Chest pain noncardiac cardiac enzyme negative recent cardiac catheterization in August showed no evidence of coronary artery disease  History of systolic CHF no evidence of exasperation Essential hypertension GERD Asthma Morbid obesity        Hospital Course  Patient is 66 year old female with known history of chronic systolic CHF, asthma, GERD, hypertension and fibromyalgia presented to the emergency room with chest pain. Patient's cardiac enzymes were negative. She's had a cardiac catheterization in August that showed no coronary artery disease. Pt was seen by cardiology who stated that her pain was atypical. And recommended maximizing her GERD therapy. Patient doing better no chest pain today. Stable for discharge. Cardiology will be considering possible AICD based on her low EF as outpatient.          Consults  cardiology  Significant Tests:  See full reports for all details     Dg Chest 2 View  Result Date: 10/19/2016 CLINICAL DATA:  Chest pain EXAM: CHEST  2 VIEW COMPARISON:  01/24/2016 FINDINGS: Chronic cardiomegaly. Stable mediastinal contours. There is no edema, consolidation, effusion, or pneumothorax. Cholecystectomy clips. There is a contour abnormality along the posterior right diaphragm correlating with fatty Bochdalek's hernia on interval chest CT. IMPRESSION: 1. No acute finding. 2. Cardiomegaly. Electronically Signed   By: Monte Fantasia M.D.   On: 10/19/2016 10:28   Xr Knee 1-2 Views Left  Result Date: 10/13/2016 Left knee 2 views: No acute fracture. No hardware failure. Well-seated total  knee components.  Xr Knee 1-2 Views Right  Result Date: 10/13/2016 Right knee: No acute fracture. The components are all well seated no hardware failure.      Today   Subjective:   Wendy Sanchez feels well denies any chest pain this morning  Objective:   Blood pressure 116/69, pulse 92, temperature 97.6 F (36.4 C), temperature source Oral, resp. rate 18, height 5\' 7"  (1.702 m), weight (!) 315 lb (142.9 kg), SpO2 100 %.  .  Intake/Output Summary (Last 24 hours) at 10/20/16 1551 Last data filed at 10/20/16 1046  Gross per 24 hour  Intake              240 ml  Output              600 ml  Net             -360 ml    Exam VITAL SIGNS: Blood pressure 116/69, pulse 92, temperature 97.6 F (36.4 C), temperature source Oral, resp. rate 18, height 5\' 7"  (1.702 m), weight (!) 315 lb (142.9 kg), SpO2 100 %.  GENERAL:  66 y.o.-year-old patient lying in the bed with no acute distress.  EYES: Pupils equal, round, reactive to light and accommodation. No scleral icterus. Extraocular muscles intact.  HEENT: Head atraumatic, normocephalic. Oropharynx and nasopharynx clear.  NECK:  Supple, no jugular venous distention. No thyroid enlargement, no tenderness.  LUNGS: Normal breath sounds bilaterally, no wheezing, rales,rhonchi or crepitation. No use of accessory muscles of respiration.  CARDIOVASCULAR: S1, S2 normal. No murmurs, rubs, or gallops.  ABDOMEN: Soft, nontender, nondistended. Bowel sounds present. No organomegaly or mass.  EXTREMITIES: No pedal edema, cyanosis, or clubbing.  NEUROLOGIC:  Cranial nerves II through XII are intact. Muscle strength 5/5 in all extremities. Sensation intact. Gait not checked.  PSYCHIATRIC: The patient is alert and oriented x 3.  SKIN: No obvious rash, lesion, or ulcer.   Data Review     CBC w Diff: Lab Results  Component Value Date   WBC 4.0 10/19/2016   HGB 13.0 10/19/2016   HCT 39.9 10/19/2016   PLT 262 10/19/2016   CMP: Lab Results  Component  Value Date   NA 139 10/19/2016   K 4.8 10/19/2016   CL 104 10/19/2016   CO2 27 10/19/2016   BUN 18 10/19/2016   CREATININE 1.15 (H) 10/19/2016  .  Micro Results No results found for this or any previous visit (from the past 240 hour(s)).   Code Status History    Date Active Date Inactive Code Status Order ID Comments User Context   10/19/2016  1:08 PM 10/20/2016  3:07 PM Full Code [id number]  Demetrios Loll, MD Inpatient   08/12/2016  2:06 PM 08/12/2016  6:05 PM Full Code [id number]  Wendy Kida, MD Inpatient   12/13/2015 12:00 PM 12/16/2015  4:02 PM Full Code [id number]  Wendy Rossetti, MD Inpatient   08/02/2015  3:32 PM 08/06/2015  5:16 PM Full Code [id number]  Wendy Rossetti, MD Inpatient          Follow-up Information    Wendy Pink, MD In 1 week.   Specialty:  Family Medicine Why:  Please call office to make appointment. Contact information: [place] [address] [phone]        Wendy Kida, MD On 10/26/2016.   Specialties:  Cardiology, Internal Medicine Why:  hosptial f/u, Appointment Time: 10:45am Contact information: [place] [address] [phone]           Discharge Medications   Allergies as of 10/20/2016      Reactions   Iodine Anaphylaxis, Swelling   Other    ALLERGY TO METAL - BLACKENS SKIN AND CAUSES A RASH    Tape Swelling   Skin comes off.  Paper tape is ok   Shellfish Allergy Nausea And Vomiting   Episode of GI infection after eating clam chowder. Still eats shrimp and other seafood      Medication List    TAKE these medications   albuterol 108 (90 Base) MCG/ACT inhaler Commonly known as:  PROVENTIL HFA;VENTOLIN HFA Inhale 2 puffs into the lungs every 6 (six) hours as needed for wheezing or shortness of breath.   aspirin 325 MG EC tablet Take 1 tablet (325 mg total) by mouth daily.   carvedilol 3.125 MG  tablet Commonly known as:  COREG Take 3.125 mg by mouth 2 (two) times daily with a meal.   clotrimazole 1 % cream Commonly known as:  LOTRIMIN Apply 1 application topically 2 (two) times daily as needed (rash).   cyclobenzaprine 10 MG tablet Commonly known as:  FLEXERIL Take 1 tablet (10 mg total) by mouth 2 (two) times daily. for muscle spams What changed:  when to take this  additional instructions   dexlansoprazole 60 MG capsule Commonly known as:  DEXILANT Take 60 mg by mouth daily.   diclofenac sodium 1 % Gel Commonly known as:  VOLTAREN Apply 2 g topically 4 (four) times daily as needed (pain).   docusate sodium 100 MG capsule Commonly known as:  COLACE Take 200 mg by mouth at  bedtime.   Fish Oil 1000 MG Caps Take 1000mg  every 3rd day   fluticasone 50 MCG/ACT nasal spray Commonly known as:  FLONASE Place 1 spray into both nostrils daily.   Fluticasone-Salmeterol 250-50 MCG/DOSE Aepb Commonly known as:  ADVAIR Inhale 1 puff into the lungs 2 (two) times daily.   folic acid 1 MG tablet Commonly known as:  FOLVITE Take 1 mg by mouth at bedtime.   furosemide 40 MG tablet Commonly known as:  LASIX Take 40 mg by mouth daily.   gabapentin 300 MG capsule Commonly known as:  NEURONTIN Take 2 capsules (600 mg total) by mouth 3 (three) times daily. What changed:  when to take this  additional instructions   ibuprofen 800 MG tablet Commonly known as:  ADVIL,MOTRIN 800 mg every 8 (eight) hours as needed.   levocetirizine 5 MG tablet Commonly known as:  XYZAL Take 5 mg by mouth at bedtime.   mirabegron ER 50 MG Tb24 tablet Commonly known as:  MYRBETRIQ Take 50 mg by mouth daily.   montelukast 10 MG tablet Commonly known as:  SINGULAIR Take 10 mg by mouth at bedtime.   MULTIVITAMIN GUMMIES ADULTS Chew Chew 2 tablets by mouth daily.   ICAPS AREDS 2 Caps Take 1 capsule by mouth 2 (two) times daily.   nortriptyline 50 MG capsule Commonly known as:   PAMELOR Take 50 mg by mouth at bedtime.   oxyCODONE-acetaminophen 7.5-325 MG tablet Commonly known as:  PERCOCET Take 1 tablet by mouth every 6 (six) hours as needed for severe pain.   potassium chloride SA 20 MEQ tablet Commonly known as:  K-DUR,KLOR-CON Take 20 mEq by mouth every other day. Take 20 meq every third day   PROBIOTIC COLON SUPPORT Caps Take 1 capsule by mouth at bedtime.   ramipril 10 MG capsule Commonly known as:  ALTACE Take 10 mg by mouth daily.   sucralfate 1 g tablet Commonly known as:  CARAFATE Take 1 g by mouth 2 (two) times daily as needed (esophageal burning).   tiotropium 18 MCG inhalation capsule Commonly known as:  [place] into inhaler and inhale.   Vitamin D (Ergocalciferol) 50000 units Caps capsule Commonly known as:  DRISDOL Take 50,000 Units by mouth every 7 (seven) days. thursdays          Total Time in preparing paper work, data evaluation and todays exam - 35 minutes  Dustin Flock M.D on 10/20/2016 at 3:51 PM  [facility]   Office  [phone]

## 2016-10-20 NOTE — Care Management Obs Status (Signed)
[facility] NOTIFICATION   Patient Details  Name: Wendy Sanchez MRN: [id number] Date of Birth: [birth year]   Medicare Observation Status Notification Given:  No < 24 hours   Katrina Stack, RN 10/20/2016, 9:22 AM

## 2016-10-20 NOTE — Discharge Instructions (Signed)
[facility] - [place] at [facility] ° °DIET:  °Cardiac diet ° °DISCHARGE CONDITION:  °Stable ° °ACTIVITY:  °Activity as tolerated ° °OXYGEN:  °Home Oxygen: No. °  °Oxygen Delivery: room air ° °DISCHARGE LOCATION:  °home  ° ° °ADDITIONAL DISCHARGE INSTRUCTION: ° ° °If you experience worsening of your admission symptoms, develop shortness of breath, life threatening emergency, suicidal or homicidal thoughts you must seek medical attention immediately by calling 911 or calling your MD immediately  if symptoms less severe. ° °You Must read complete instructions/literature along with all the possible adverse reactions/side effects for all the Medicines you take and that have been prescribed to you. Take any new Medicines after you have completely understood and accpet all the possible adverse reactions/side effects.  ° °Please note ° °You were cared for by a hospitalist during your hospital stay. If you have any questions about your discharge medications or the care you received while you were in the hospital after you are discharged, you can call the unit and asked to speak with the hospitalist on call if the hospitalist that took care of you is not available. Once you are discharged, your primary care physician will handle any further medical issues. Please note that NO REFILLS for any discharge medications will be authorized once you are discharged, as it is imperative that you return to your primary care physician (or establish a relationship with a primary care physician if you do not have one) for your aftercare needs so that they can reassess your need for medications and monitor your lab values. ° ° °

## 2016-10-21 ENCOUNTER — Encounter: Payer: Self-pay | Admitting: *Deleted

## 2016-10-21 DIAGNOSIS — I5022 Chronic systolic (congestive) heart failure: Secondary | ICD-10-CM | POA: Diagnosis not present

## 2016-10-21 NOTE — Progress Notes (Signed)
Daily Session Note  Patient Details  Name: Wendy Sanchez MRN: [id number] Date of Birth: Apr 22, 1950 Referring Provider:     Cardiac Rehab from 08/20/2016 in [facility]  Referring Provider  Lorrene Reid MD      Encounter Date: 10/21/2016  Check In:     Session Check In - 10/21/16 0745      Check-In   Location [facility]-Cardiac & Pulmonary Rehab   Staff Present Alberteen Sam, MA, ACSM RCEP, Exercise Physiologist;Mary Kellie Shropshire, RN, BSN, Lauretta Grill RCP,RRT,BSRT   Supervising physician immediately available to respond to emergencies See telemetry face sheet for immediately available ER MD   Medication changes reported     Yes   Comments taking new medication in epic see D/C sum.   Fall or balance concerns reported    No   Warm-up and Cool-down Performed on first and last piece of equipment   Resistance Training Performed Yes   VAD Patient? No     Pain Assessment   Currently in Pain? No/denies   Multiple Pain Sites No         History  Smoking Status  . Former Smoker  . Packs/day: 0.25  . Years: 50.00  . Quit date: 08/22/2016  Smokeless Tobacco  . Former Systems developer  . Types: Snuff, Chew  . Quit date: 01/13/1995    Comment: Planned quit date August 28, 2016    Goals Met:  Independence with exercise equipment Exercise tolerated well No report of cardiac concerns or symptoms Strength training completed today  Goals Unmet:  Not Applicable  Comments: Pt able to follow exercise prescription today without complaint.  Will continue to monitor for progression.   Dr. Emily Filbert is Medical Director for [facility].

## 2016-10-21 NOTE — Progress Notes (Signed)
Cardiac Individual Treatment Plan  Patient Details  Name: VYLET MAFFIA MRN: [id number] Date of Birth: [birth year] Referring Provider:     Cardiac Rehab from 08/20/2016 in [facility] Cardiac and Pulmonary Rehab  Referring Provider  Lorrene Reid MD      Initial Encounter Date:    Cardiac Rehab from 08/20/2016 in [facility] Cardiac and Pulmonary Rehab  Date  08/20/16  Referring Provider  Lorrene Reid MD      Visit Diagnosis: No diagnosis found.  Patient's Home Medications on Admission:  Current Outpatient Prescriptions:  .  albuterol (PROVENTIL HFA;VENTOLIN HFA) 108 (90 Base) MCG/ACT inhaler, Inhale 2 puffs into the lungs every 6 (six) hours as needed for wheezing or shortness of breath., Disp: , Rfl:  .  aspirin EC 325 MG EC tablet, Take 1 tablet (325 mg total) by mouth daily., Disp: 30 tablet, Rfl: 0 .  carvedilol (COREG) 3.125 MG tablet, Take 3.125 mg by mouth 2 (two) times daily with a meal. , Disp: , Rfl:  .  clotrimazole (LOTRIMIN) 1 % cream, Apply 1 application topically 2 (two) times daily as needed (rash). , Disp: , Rfl:  .  cyclobenzaprine (FLEXERIL) 10 MG tablet, Take 1 tablet (10 mg total) by mouth 2 (two) times daily. for muscle spams (Patient taking differently: Take 10 mg by mouth 3 (three) times daily. for muscle spams), Disp: 60 tablet, Rfl: 3 .  dexlansoprazole (DEXILANT) 60 MG capsule, Take 60 mg by mouth daily., Disp: , Rfl:  .  diclofenac sodium (VOLTAREN) 1 % GEL, Apply 2 g topically 4 (four) times daily as needed (pain). , Disp: , Rfl:  .  docusate sodium (COLACE) 100 MG capsule, Take 200 mg by mouth at bedtime. , Disp: , Rfl:  .  fluticasone (FLONASE) 50 MCG/ACT nasal spray, Place 1 spray into both nostrils daily., Disp: , Rfl:  .  Fluticasone-Salmeterol (ADVAIR) 250-50 MCG/DOSE AEPB, Inhale 1 puff into the lungs 2 (two) times daily., Disp: , Rfl:  .  folic acid (FOLVITE) 1 MG tablet, Take 1 mg by mouth at bedtime. , Disp: , Rfl:  .  furosemide (LASIX) 40 MG tablet,  Take 40 mg by mouth daily. , Disp: , Rfl:  .  gabapentin (NEURONTIN) 300 MG capsule, Take 2 capsules (600 mg total) by mouth 3 (three) times daily. (Patient taking differently: Take 600 mg by mouth 2 (two) times daily. May take an additional 648m as needed for pain), Disp: 180 capsule, Rfl: 1 .  ibuprofen (ADVIL,MOTRIN) 800 MG tablet, 800 mg every 8 (eight) hours as needed. , Disp: , Rfl:  .  levocetirizine (XYZAL) 5 MG tablet, Take 5 mg by mouth at bedtime. , Disp: , Rfl:  .  mirabegron ER (MYRBETRIQ) 50 MG TB24 tablet, Take 50 mg by mouth daily., Disp: , Rfl:  .  montelukast (SINGULAIR) 10 MG tablet, Take 10 mg by mouth at bedtime., Disp: , Rfl:  .  Multiple Vitamins-Minerals (ICAPS AREDS 2) CAPS, Take 1 capsule by mouth 2 (two) times daily. , Disp: , Rfl:  .  Multiple Vitamins-Minerals (MULTIVITAMIN GUMMIES ADULTS) CHEW, Chew 2 tablets by mouth daily. , Disp: , Rfl:  .  nortriptyline (PAMELOR) 50 MG capsule, Take 50 mg by mouth at bedtime., Disp: , Rfl:  .  Omega-3 Fatty Acids (FISH OIL) 1000 MG CAPS, Take 10035mevery 3rd day, Disp: , Rfl:  .  oxyCODONE-acetaminophen (PERCOCET) 7.5-325 MG tablet, Take 1 tablet by mouth every 6 (six) hours as needed for severe pain., Disp: 120  tablet, Rfl: 0 .  potassium chloride SA (K-DUR,KLOR-CON) 20 MEQ tablet, Take 20 mEq by mouth every other day. Take 20 meq every third day, Disp: , Rfl:  .  Probiotic Product (PROBIOTIC COLON SUPPORT) CAPS, Take 1 capsule by mouth at bedtime., Disp: , Rfl:  .  ramipril (ALTACE) 10 MG capsule, Take 10 mg by mouth daily. , Disp: , Rfl:  .  sucralfate (CARAFATE) 1 G tablet, Take 1 g by mouth 2 (two) times daily as needed (esophageal burning). , Disp: , Rfl:  .  tiotropium (SPIRIVA) 18 MCG inhalation capsule, Place into inhaler and inhale., Disp: , Rfl:  .  Vitamin D, Ergocalciferol, (DRISDOL) 50000 UNITS CAPS capsule, Take 50,000 Units by mouth every 7 (seven) days. thursdays, Disp: , Rfl:   Past Medical History: Past  Medical History:  Diagnosis Date  . Allergic rhinitis   . Arthritis   . Asthma    problems with fumes and aerosols cause asthma  . Complication of anesthesia    awakens during surgery; has occurred with last 3-4 surgeries   . Environmental allergies    fumes   . Fibromyalgia   . GERD (gastroesophageal reflux disease)   . History of bronchitis   . Hypertension   . Numbness    hands bilat when driving; improves when not preforming task   . Status post gastric banding   . Tinnitus    comes and goes   . Vertigo    6 months ago approx     Tobacco Use: History  Smoking Status  . Former Smoker  . Packs/day: 0.25  . Years: 50.00  . Quit date: 08/22/2016  Smokeless Tobacco  . Former Systems developer  . Types: Snuff, Chew  . Quit date: 01/13/1995    Comment: Planned quit date August 28, 2016    Labs: Recent Review Scientist, physiological    Labs for ITP Cardiac and Pulmonary Rehab 03/16/2007   Cholestrol 236 ATP III CLASSIFICATION: <200     mg/dL   Desirable 200-239  mg/dL   Borderline High >=240    mg/dL   High(H)   LDLCALC 150 Total Cholesterol/HDL:CHD Risk Coronary Heart Disease Risk Table Men   Women 1/2 Average Risk   3.4   3.3(H)   HDL 71   Trlycerides 76       Exercise Target Goals:    Exercise Program Goal: Individual exercise prescription set with THRR, safety & activity barriers. Participant demonstrates ability to understand and report RPE using BORG scale, to self-measure pulse accurately, and to acknowledge the importance of the exercise prescription.  Exercise Prescription Goal: Starting with aerobic activity 30 plus minutes a day, 3 days per week for initial exercise prescription. Provide home exercise prescription and guidelines that participant acknowledges understanding prior to discharge.  Activity Barriers & Risk Stratification:     Activity Barriers & Cardiac Risk Stratification - 08/20/16 1410      Activity Barriers & Cardiac Risk Stratification   Activity  Barriers Arthritis;Left Knee Replacement;Right Knee Replacement;Shortness of Breath;Muscular Weakness;Deconditioning;Joint Problems;Fibromyalgia  Knee replacements were done in 2017.  Right shoulder rotator cuff repair, removal of bone spurs and other repair done. Has ganglion on left wrist. Arthritis Right ankle   Cardiac Risk Stratification High      6 Minute Walk:     6 Minute Walk    Row Name 08/20/16 1440 10/19/16 0859       6 Minute Walk   Phase Initial Discharge    Distance 830 feet  1025 feet    Distance % Change  - 23 %    Distance Feet Change  - 195 ft    Walk Time 6 minutes 6 minutes    # of Rest Breaks 1  1:02 0    MPH 1.9 1.94    METS 1.22 1.83    RPE 17 15    Perceived Dyspnea  4 2    VO2 Peak 4.29 6.42    Symptoms Yes (comment) Yes (comment)    Comments SOB, right sided chest pain, leg pain 8/10 Left leg pain during last 3 laps (10/10) resolved with rest    Resting HR 91 bpm 78 bpm    Resting BP 124/70 124/60    Exercise Oxygen Saturation  during 6 min walk  - 100 %    Max Ex. HR 118 bpm 132 bpm    Max Ex. BP 128/74 138/68    2 Minute Post BP 116/66  -       Oxygen Initial Assessment:   Oxygen Re-Evaluation:   Oxygen Discharge (Final Oxygen Re-Evaluation):   Initial Exercise Prescription:     Initial Exercise Prescription - 08/20/16 1400      Date of Initial Exercise RX and Referring Provider   Date 08/20/16   Referring Provider Lorrene Reid MD     Treadmill   MPH 1.5   Grade 0   Minutes 15   METs 2.15     Recumbant Elliptical   Level 1   RPM 50   Minutes 15   METs 2     T5 Nustep   Level 1   SPM 80   Minutes 15   METs 2     Prescription Details   Frequency (times per week) 3   Duration Progress to 45 minutes of aerobic exercise without signs/symptoms of physical distress     Intensity   THRR 40-80% of Max Heartrate 117-142   Ratings of Perceived Exertion 11-13   Perceived Dyspnea 0-4     Progression   Progression  Continue to progress workloads to maintain intensity without signs/symptoms of physical distress.     Resistance Training   Training Prescription Yes   Weight 2 lbs   Reps 10-15      Perform Capillary Blood Glucose checks as needed.  Exercise Prescription Changes:     Exercise Prescription Changes    Row Name 08/20/16 1300 09/01/16 1500 09/07/16 0900 09/16/16 1500 09/30/16 1500     Response to Exercise   Blood Pressure (Admit) 124/70 126/76  - 146/84 124/70   Blood Pressure (Exercise) 128/74 164/70  - 140/70 126/70   Blood Pressure (Exit) 116/66 130/70  - 106/58 134/70   Heart Rate (Admit) 91 bpm 107 bpm  - 106 bpm 90 bpm   Heart Rate (Exercise) 121 bpm 140 bpm  - 111 bpm 136 bpm   Heart Rate (Exit) 101 bpm 95 bpm  - 104 bpm 88 bpm   Oxygen Saturation (Admit) 100 %  -  -  -  -   Oxygen Saturation (Exercise) 98 %  -  -  -  -   Rating of Perceived Exertion (Exercise) 17 15  - 14 17   Symptoms SOB, right sided chest pain, leg pain 8/10 chest tightness  - back pain back and ankle pain   Comments walk test results 2nd full day of exercise  -  -  -   Duration  - Progress to 45 minutes of aerobic exercise without  signs/symptoms of physical distress  - Progress to 45 minutes of aerobic exercise without signs/symptoms of physical distress Continue with 45 min of aerobic exercise without signs/symptoms of physical distress.   Intensity  - THRR unchanged  - THRR unchanged THRR unchanged     Progression   Progression  - Continue to progress workloads to maintain intensity without signs/symptoms of physical distress.  - Continue to progress workloads to maintain intensity without signs/symptoms of physical distress. Continue to progress workloads to maintain intensity without signs/symptoms of physical distress.   Average METs  - 1.92  - 1.88 1.98     Resistance Training   Training Prescription  - Yes  - Yes Yes   Weight  - 2 lbs  - 2 lbs 3 lbs   Reps  - 10-15  - 10-15 10-15     Interval  Training   Interval Training  - No  - No No     Treadmill   MPH  - 1.5  - 1.5 1.5   Grade  - 0  - 0 0   Minutes  - 15  - 15 10   METs  - 2.15  - 2.15 2.15     Recumbant Elliptical   Level  - 1  - 1.5 1.5   Minutes  - 15  - 15 15   METs  - 1.6  - 1.4 1.4     T5 Nustep   Level  - 3  - 5 5   Minutes  - 15  - 15 15   METs  - 2  - 2.4 2.4     Home Exercise Plan   Plans to continue exercise at  -  - [employer] (comment)  [employer], walking, pool walking [employer] (comment)  [employer], walking, pool walking [employer] (comment)  [employer], walking, pool walking   Frequency  -  - Add 2 additional days to program exercise sessions. Add 2 additional days to program exercise sessions. Add 2 additional days to program exercise sessions.   Initial Home Exercises Provided  -  - 09/07/16 09/07/16 09/07/16   Row Name 10/15/16 1400             Response to Exercise   Blood Pressure (Admit) 132/68       Blood Pressure (Exercise) 136/74       Blood Pressure (Exit) 132/68       Heart Rate (Admit) 87 bpm       Heart Rate (Exercise) 129 bpm       Heart Rate (Exit) 109 bpm       Rating of Perceived Exertion (Exercise) 15       Symptoms back and ankle pain       Duration Continue with 45 min of aerobic exercise without signs/symptoms of physical distress.       Intensity THRR unchanged         Progression   Progression Continue to progress workloads to maintain intensity without signs/symptoms of physical distress.       Average METs 1.95         Resistance Training   Training Prescription Yes       Weight 4 lbs       Reps 10-15         Interval Training   Interval Training No         Treadmill   MPH 1.5       Grade 0  Minutes 15       METs 2.15         Recumbant Elliptical   Level 1.5       Minutes 15       METs 1.5         T5 Nustep   Level 5       Minutes 15       METs 2.2         Home Exercise Plan   Plans to  continue exercise at [employer] (comment)  [employer], walking, pool walking       Frequency Add 2 additional days to program exercise sessions.       Initial Home Exercises Provided 09/07/16          Exercise Comments:     Exercise Comments    Row Name 08/26/16 0900 08/31/16 1150 09/16/16 0856       Exercise Comments First full day of exercise!  Patient was oriented to gym and equipment including functions, settings, policies, and procedures.  Patient's individual exercise prescription and treatment plan were reviewed.  All starting workloads were established based on the results of the 6 minute walk test done at initial orientation visit.  The plan for exercise progression was also introduced and progression will be customized based on patient's performance and goals. Takiesha had "chest tightness" which started when she was walking at 1.15mh TM. BLillareported that she recently had a heart cath with no blockage and that is to see the breathing doctor this week. BArnitrareported her "chest pressure: decreased wtih rest and eventually went away. BAnataliaresumed on the treadmill by herself.  BLizzagot tangled up with the dogs and fell in a hole which hurt her back and left leg. She was able to tolerate exercise today.        Exercise Goals and Review:     Exercise Goals    Row Name 08/20/16 1444             Exercise Goals   Increase Physical Activity Yes       Intervention Provide advice, education, support and counseling about physical activity/exercise needs.;Develop an individualized exercise prescription for aerobic and resistive training based on initial evaluation findings, risk stratification, comorbidities and participant's personal goals.       Expected Outcomes Achievement of increased cardiorespiratory fitness and enhanced flexibility, muscular endurance and strength shown through measurements of functional capacity and personal statement of participant.        Increase Strength and Stamina Yes       Intervention Provide advice, education, support and counseling about physical activity/exercise needs.;Develop an individualized exercise prescription for aerobic and resistive training based on initial evaluation findings, risk stratification, comorbidities and participant's personal goals.       Expected Outcomes Achievement of increased cardiorespiratory fitness and enhanced flexibility, muscular endurance and strength shown through measurements of functional capacity and personal statement of participant.          Exercise Goals Re-Evaluation :     Exercise Goals Re-Evaluation    Row Name 09/01/16 1541 09/07/16 0901 09/07/16 0905 09/16/16 1551 09/30/16 1540     Exercise Goal Re-Evaluation   Exercise Goals Review Increase Strenth and Stamina;Increase Physical Activity Knowledge and understanding of Target Heart Rate Range (THRR);Able to check pulse independently;Understanding of Exercise Prescription;Increase Physical Activity;Able to understand and use rate of perceived exertion (RPE) scale Increase Physical Activity;Increase Strength and Stamina;Understanding of Exercise Prescription Increase Physical Activity;Increase Strength and  Stamina Increase Physical Activity;Increase Strength and Stamina   Comments Ellisa is off to a good start in rehab.  She did have some chest tightness on treadmill on her second day of exercise.  We will continue to monitor her progression.   Reviewed home exercise with pt today.  Pt plans to walk and go to [employer] for exercise.  Reviewed THR, pulse, RPE, sign and symptoms, NTG use, and when to call 911 or MD.  Also discussed weather considerations and indoor options.  Pt voiced understanding. Onesha has been doing well in rehab.  She mentioned that her first week she was so sore that she thought she had a UTI. She continues to have low back pain, but moving does make it feel better.  She has already started  walking some at home and has made it half way around her pond. She is determined to get all the away around.  Trinidy continues to do well in rehab.  She pulled a muscle in her back and twisted her ankle over the weekend.  She did not do the treadmill today as a results.  She is makeing some progress.  We will continue to monitor her progression.  Shaye continues to do the best she can in rehab.  She continues to have pain is her back and ankle.  She was not able to stay on the treadmill the whole time, but did do extra time on treadmill.  She does not want to take extra medication to manage her pain so we discussed alternatives for pain management including chiropractor or acupuncture.  We also discussed getting new shoes to better support her feet and ankle especially since her current shoes are over eight years old.  We will continue to monitor her progress.    Expected Outcomes Short: Work on building up stamina and increasing time on treadmill.  Long: Continue to work on increasing physcial activity.  Short: Continue to walk at home and start to go back to pool at [facility].  Long: Make exercise part of daily routine.  Short: Start to exercise more at home.  Long: Continue to exercise daily. Short: Continue to try to add in exercise at home.   Long: Make exercise part of routine.  Short: Look into new shoes and alternate pain management strategies  Long: Exercise more at home.    Why Name 10/05/16 1497 10/15/16 1445 10/19/16 0903         Exercise Goal Re-Evaluation   Exercise Goals Review Increase Strength and Stamina;Increase Physical Activity;Understanding of Exercise Prescription Increase Strength and Stamina;Increase Physical Activity Increase Physical Activity;Increase Strength and Stamina     Comments Continues to consistantly attend cardiac rehab and exercise at home. She has been doingwalking,  wall sits, lunges, and wall pushups, and other strength exercises at home at home. Stated she is  feeling stronger. SOB is the main limiting factor with activity.  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  Reagyn has continued to do well in rehab.  She is feeling stronger, but continues to have back, knee, and ankle pain.  She has gone to pick out her shoes and is just waiting for foundation to okay payment.  She is due to graduate soon.  We will continue to monitor her progres. 6 min walk test was done with patient today. She improved by 23%/195 ft. from  initial test. See 6 min walk data sheet for detailed report. Results were reviewed with patient.      Expected Outcomes Short: work with staff and foundation to get a new pair of shoes Long: continue to increase actitiy at home.  Short: Improve her post 6MWT.  Long: Continue to increase activity at home.   -        Discharge Exercise Prescription (Final Exercise Prescription Changes):     Exercise Prescription Changes - 10/15/16 1400      Response to Exercise   Blood Pressure (Admit) 132/68   Blood Pressure (Exercise) 136/74   Blood Pressure (Exit) 132/68   Heart Rate (Admit) 87 bpm   Heart Rate (Exercise) 129 bpm   Heart Rate (Exit) 109 bpm   Rating of Perceived Exertion (Exercise) 15   Symptoms back and ankle pain   Duration Continue with 45 min of aerobic exercise without signs/symptoms of physical distress.   Intensity THRR unchanged     Progression   Progression Continue to progress workloads to maintain intensity without signs/symptoms of physical distress.   Average METs 1.95     Resistance Training   Training Prescription Yes   Weight 4 lbs   Reps 10-15     Interval Training   Interval Training No     Treadmill   MPH 1.5   Grade 0   Minutes 15   METs 2.15     Recumbant Elliptical   Level 1.5   Minutes 15   METs 1.5     T5 Nustep   Level 5   Minutes 15   METs 2.2     Home Exercise Plan    Plans to continue exercise at [employer] (comment)  [employer], walking, pool walking   Frequency Add 2 additional days to program exercise sessions.   Initial Home Exercises Provided 09/07/16      Nutrition:  Target Goals: Understanding of nutrition guidelines, daily intake of sodium <1552m, cholesterol <2011m calories 30% from fat and 7% or less from saturated fats, daily to have 5 or more servings of fruits and vegetables.  Biometrics:     Pre Biometrics - 10/19/16 0858      Pre Biometrics   Height 5' 7.4" (1.712 m)   Weight (!)  315 lb (142.9 kg)   Waist Circumference 42.5 inches   Hip Circumference 59 inches   Waist to Hip Ratio 0.72 %   BMI (Calculated) 48.75   Single Leg Stand 4.55 seconds       Nutrition Therapy Plan and Nutrition Goals:     Nutrition Therapy & Goals - 09/16/16 1836      Nutrition Therapy   Diet basic heart healthy     Personal Nutrition Goals   Nutrition Goal eat every 3-4 hours during the day   Personal Goal #2 Include protein with every meal -- try adding some protein drink to foods such as oatmeal or cream of wheat; can also try powdered peanut butter added to foods.    Personal Goal #3 Avoid drinking fluids during meals to have more room for food.   Personal Goal #4 Fluid goal is 64-100oz daily, and this includes all fluids (not just water)   Additional Goals? No   Comments Ms. Rammel reports frequently skipping meals, and is unable to eat much at any one time due to gastric band. Provided her with 800-1200kcal bariatric menus for meal ideas and to aid her in meeting basic nutrient needs.  Intervention Plan   Intervention Nutrition handout(s) given to patient.      Nutrition Discharge: Rate Your Plate Scores:     Nutrition Assessments - 08/20/16 1346      MEDFICTS Scores   Pre Score 35      Nutrition Goals Re-Evaluation:     Nutrition Goals Re-Evaluation    Row Name 09/07/16 0850 10/05/16 0842            Goals   Current Weight 317 lb 4.8 oz (143.9 kg) 317 lb (143.8 kg)      Nutrition Goal Eat more frequent meals eat every 3-4 hours during the day      Comment Scheduled appointment to meet with nutrtitionist on 9/5 at [place] has met with the dietician and is trying to follow her recommendations. She has been eating more fruits and vegetable and is trying to eat several small meals thoughout the day. She has also stopped adding salt to food.       Expected Outcome Short: Meet with nutritionist  Long: Find meal plan that works for her. Short: continue to increase fruit and vegetable intake. Long: follow heart healthy diet as a part of a healthy and active lifestyle        Personal Goal #2 Re-Evaluation   Personal Goal #2  - Has added grilled or baked chicken to diet to increase protein         Personal Goal #3 Re-Evaluation   Personal Goal #3  - cut down on fluid intake during meals but still needs to drink some to get food down.         Personal Goal #4 Re-Evaluation   Personal Goal #4  - Hassan Rowan has been consistant with 64-100 oz range for fluid.          Nutrition Goals Discharge (Final Nutrition Goals Re-Evaluation):     Nutrition Goals Re-Evaluation - 10/05/16 0842      Goals   Current Weight 317 lb (143.8 kg)   Nutrition Goal eat every 3-4 hours during the day   Comment Amedeo Plenty has met with the dietician and is trying to follow her recommendations. She has been eating more fruits and vegetable and is trying to eat several small meals thoughout the day. She has also stopped adding salt to food.    Expected Outcome Short: continue to increase fruit and vegetable intake. Long: follow heart healthy diet as a part of a healthy and active lifestyle     Personal Goal #2 Re-Evaluation   Personal Goal #2 Has added grilled or baked chicken to diet to increase protein      Personal Goal #3 Re-Evaluation   Personal Goal #3 cut down on fluid intake during meals but still needs to drink  some to get food down.      Personal Goal #4 Re-Evaluation   Personal Goal #4 Hassan Rowan has been consistant with 64-100 oz range for fluid.       Psychosocial: Target Goals: Acknowledge presence or absence of significant depression and/or stress, maximize coping skills, provide positive support system. Participant is able to verbalize types and ability to use techniques and skills needed for reducing stress and depression.   Initial Review & Psychosocial Screening:     Initial Psych Review & Screening - 08/20/16 1409      Initial Review   Comments Avalon is aso stredd about the fact that her medical team has not been listening to her.  She wants to be heard ,  evaluated and treated.       Quality of Life Scores:      Quality of Life - 08/20/16 1350      Quality of Life Scores   Health/Function Pre 10.64 %   Socioeconomic Pre 11.44 %   Psych/Spiritual Pre 16.29 %   Family Pre 5.63 %   GLOBAL Pre 10.64 %      PHQ-9: Recent Review Flowsheet Data    Depression screen [facility] 2/9 09/08/2016 09/07/2016 08/20/2016 07/16/2016 05/18/2016   Decreased Interest 0 0 0 0 0   Down, Depressed, Hopeless 0 0 0 0 0   PHQ - 2 Score 0 0 0 0 0   Altered sleeping - 3 3 - -   Tired, decreased energy - 0 2 - -   Change in appetite - 3 3 - -   Feeling bad or failure about yourself  - 0 0 - -   Trouble concentrating - 0 0 - -   Moving slowly or fidgety/restless - 0 0 - -   Suicidal thoughts - 0 0 - -   PHQ-9 Score - 6 8 - -   Difficult doing work/chores - Somewhat difficult Very difficult - -     Interpretation of Total Score  Total Score Depression Severity:  1-4 = Minimal depression, 5-9 = Mild depression, 10-14 = Moderate depression, 15-19 = Moderately severe depression, 20-27 = Severe depression   Psychosocial Evaluation and Intervention:     Psychosocial Evaluation - 08/26/16 0934      Psychosocial Evaluation & Interventions   Interventions Encouraged to exercise with the program and follow  exercise prescription;Relaxation education;Stress management education   Comments Counselor met with Ms. Okane [name]) for initial psychosocial evaluation.  She is a 66 year old who was recently diagnosed with CHF.  She has multiple health issues with knee replacement surgeries (both) in 2017; bone spurs and rotator cuff surgery in 2015; Arthritis and Fibromyalgia with chronic pain; recurring yeast infections and chronic sleep problems.  She also struggles with being overweight.  Janika has a strong support system with adult children locally as well as (2) sisters.  She also has limited appetite for the past 6-8 months.  Addaleigh denies a history of depression or anxiety or any current symptoms; although counselor discussed her PHQ-9 score of "8" indicating mild depressive symptoms.  Also her Quality of Life scores are considerably low.  Counselor brought this to Mariama's attention and she agreed that her health issues; recent losses of family members; financial stress; and she just quit smoking all are contributing to her lower quality of life currently.  She admits to increased irritability since quitting smoking but is relying on her strong support system during this time.  Her goals for this program are to improve her health and lose some weight.  Staff will follow with [name] throughout the course of this program.     Expected Outcomes Robi will benefit from consistent exercise to achieve her stated goals.  The educational and psychoeducational components of this program will help her cope with and manage her current symptoms better.  Meeting with the dietician will also help Analucia achieve her stated goals.  Counselor encouraged her to contact her gynecologist concerning her chronic yeast infections.  Staff will follow with Ms. Helmkamp.   Continue Psychosocial Services  Follow up required by staff      Psychosocial Re-Evaluation:     Psychosocial Re-Evaluation    [place] Name 08/31/16 1154  09/07/16 2202  09/30/16 1542 10/05/16 0857 10/12/16 0959     Psychosocial Re-Evaluation   Current issues with  - Current Sleep Concerns;Current Stress Concerns Current Stress Concerns Current Stress Concerns;Current Sleep Concerns  -   Comments Adreanne stated she if frustated since the doctors don't seem to listen to her and she feels she gets conflicting information.  Noralyn is feeling great mentally.  Definitely making improvements!!   She has quit smoking!!! And was congratulated for her continued abstienance.  Her PHQ-9 score is down two points today from when she started. We are heading in the right direction.  Her stressors are her lack of sleep and lack of appeptite.  We scheduled her to meet with nutrtiton to help with diet.  We talked about natural remedies for sleep using melatonin and/or sleepytime teas to help her sleep better.   If these don't help it may be worth looking into a sleep study.  She says that although she is feeling better her support system still thinks she needs to keep improving. She continues to have pain is her back and ankle.  She was not able to stay on the treadmill the whole time, but did do extra time on treadmill.  She does not want to take extra medication to manage her pain so we discussed alternatives for pain management including chiropractor or acupuncture.   Amedeo Plenty does not sleep will but is in communication with her doctor about a C-pap. Her stress is improving and she stated she is learning to better control her anger. She uses prayer to help her with this. Her main frustration is lack of communication between her doctors.   Rashanda reports feeling better overall with her mood and ability to cope with stressors in her life.  She is also losing weight and feeling empowered by this.  She continues to only sleep 2-3 hours per night but should begin using a CPAP this Thursday per Doctors orders.  She is hoping this will help.  Mahum is consistently coming to class and  working out.  Counselor commended her on her progress made and commitment to improving her health.     Expected Outcomes  - Short: Try the sleepy time tea and melatonin to sleep better.  Long: Continue to have postive mood and keep moving forward. Short: Look in alternate pain management therapies.  Long: Improve pain management to improve overall quality of life.  Short: Look into alternative pain management. Communicate with doctor about C-pap. Long: improved sleep and pain management will improve quality of life.   -   Interventions  -  - Encouraged to attend Cardiac Rehabilitation for the exercise;Stress management education Encouraged to attend Cardiac Rehabilitation for the exercise;Stress management education;Relaxation education  -   Continue Psychosocial Services  Follow up required by staff  - Follow up required by staff Follow up required by staff  -      Psychosocial Discharge (Final Psychosocial Re-Evaluation):     Psychosocial Re-Evaluation - 10/12/16 0959      Psychosocial Re-Evaluation   Comments Zaryah reports feeling better overall with her mood and ability to cope with stressors in her life.  She is also losing weight and feeling empowered by this.  She continues to only sleep 2-3 hours per night but should begin using a CPAP this Thursday per Doctors orders.  She is hoping this will help.  Darneshia is consistently coming to class and working out.  Counselor commended her on her progress made and commitment to improving  her health.        Vocational Rehabilitation: Provide vocational rehab assistance to qualifying candidates.   Vocational Rehab Evaluation & Intervention:     Vocational Rehab - 08/20/16 1358      Initial Vocational Rehab Evaluation & Intervention   Assessment shows need for Vocational Rehabilitation No      Education: Education Goals: Education classes will be provided on a variety of topics geared toward better understanding of heart health and risk  factor modification. Participant will state understanding/return demonstration of topics presented as noted by education test scores.  Learning Barriers/Preferences:     Learning Barriers/Preferences - 08/20/16 1357      Learning Barriers/Preferences   Learning Barriers Sight  Left eye vision at times is blurred   Learning Preferences None      Education Topics: General Nutrition Guidelines/Fats and Fiber: -Group instruction provided by verbal, written material, models and posters to present the general guidelines for heart healthy nutrition. Gives an explanation and review of dietary fats and fiber.   Cardiac Rehab from 10/19/2016 in [facility] Cardiac and Pulmonary Rehab  Date  08/31/16  Educator  CR  Instruction Review Code  1- Verbalizes Understanding      Controlling Sodium/Reading Food Labels: -Group verbal and written material supporting the discussion of sodium use in heart healthy nutrition. Review and explanation with models, verbal and written materials for utilization of the food label.   Cardiac Rehab from 10/19/2016 in [facility] Cardiac and Pulmonary Rehab  Date  09/07/16  Educator  CR  Instruction Review Code  1- Verbalizes Understanding      Exercise Physiology & Risk Factors: - Group verbal and written instruction with models to review the exercise physiology of the cardiovascular system and associated critical values. Details cardiovascular disease risk factors and the goals associated with each risk factor.   Cardiac Rehab from 10/19/2016 in [facility] Cardiac and Pulmonary Rehab  Date  09/16/16  Educator  [facility]  Instruction Review Code  1- Verbalizes Understanding      Aerobic Exercise & Resistance Training: - Gives group verbal and written discussion on the health impact of inactivity. On the components of aerobic and resistive training programs and the benefits of this training and how to safely progress through these programs.   Cardiac Rehab from 10/19/2016 in [facility] Cardiac  and Pulmonary Rehab  Date  09/21/16  Educator  [facility]  Instruction Review Code  1- Verbalizes Understanding      Flexibility, Balance, General Exercise Guidelines: - Provides group verbal and written instruction on the benefits of flexibility and balance training programs. Provides general exercise guidelines with specific guidelines to those with heart or lung disease. Demonstration and skill practice provided.   Cardiac Rehab from 10/19/2016 in [facility] Cardiac and Pulmonary Rehab  Date  09/23/16  Educator  [facility]  Instruction Review Code  1- Verbalizes Understanding      Stress Management: - Provides group verbal and written instruction about the health risks of elevated stress, cause of high stress, and healthy ways to reduce stress.   Cardiac Rehab from 10/19/2016 in [facility] Cardiac and Pulmonary Rehab  Date  09/30/16  Educator  [facility]  Instruction Review Code  1- Verbalizes Understanding      Depression: - Provides group verbal and written instruction on the correlation between heart/lung disease and depressed mood, treatment options, and the stigmas associated with seeking treatment.   Cardiac Rehab from 10/19/2016 in [facility] Cardiac and Pulmonary Rehab  Date  09/02/16  Educator  [facility]  Instruction Review Code (retired)  2- meets goals/outcomes  Instruction Review Code  1- Actuary & Physiology of the Heart: - Group verbal and written instruction and models provide basic cardiac anatomy and physiology, with the coronary electrical and arterial systems. Review of: AMI, Angina, Valve disease, Heart Failure, Cardiac Arrhythmia, Pacemakers, and the ICD.   Cardiac Procedures: - Group verbal and written instruction to review commonly prescribed medications for heart disease. Reviews the medication, class of the drug, and side effects. Includes the steps to properly store meds and maintain the prescription regimen. (beta blockers and nitrates)   Cardiac Rehab from 10/19/2016  in [facility] Cardiac and Pulmonary Rehab  Date  10/05/16  Educator  CE  Instruction Review Code  2- Demonstrated Understanding      Cardiac Medications I: - Group verbal and written instruction to review commonly prescribed medications for heart disease. Reviews the medication, class of the drug, and side effects. Includes the steps to properly store meds and maintain the prescription regimen.   Cardiac Rehab from 10/19/2016 in [facility] Cardiac and Pulmonary Rehab  Date  10/12/16  Educator  [facility]  Instruction Review Code  1- Verbalizes Understanding      Cardiac Medications II: -Group verbal and written instruction to review commonly prescribed medications for heart disease. Reviews the medication, class of the drug, and side effects. (all other drug classes)    Go Sex-Intimacy & Heart Disease, Get SMART - Goal Setting: - Group verbal and written instruction through game format to discuss heart disease and the return to sexual intimacy. Provides group verbal and written material to discuss and apply goal setting through the application of the S.M.A.R.T. Method.   Cardiac Rehab from 10/19/2016 in [facility] Cardiac and Pulmonary Rehab  Date  10/05/16  Educator  CE  Instruction Review Code  2- Demonstrated Understanding      Other Matters of the Heart: - Provides group verbal, written materials and models to describe Heart Failure, Angina, Valve Disease, Peripheral Artery Disease, and Diabetes in the realm of heart disease. Includes description of the disease process and treatment options available to the cardiac patient.   Exercise & Equipment Safety: - Individual verbal instruction and demonstration of equipment use and safety with use of the equipment.   Cardiac Rehab from 10/19/2016 in [facility] Cardiac and Pulmonary Rehab  Date  08/20/16  Educator  [facility]  Instruction Review Code  1- Verbalizes Understanding      Infection Prevention: - Provides verbal and written material to individual with  discussion of infection control including proper hand washing and proper equipment cleaning during exercise session.   Cardiac Rehab from 10/19/2016 in [facility] Cardiac and Pulmonary Rehab  Date  08/20/16  Educator  [facility]  Instruction Review Code  1- Verbalizes Understanding      Falls Prevention: - Provides verbal and written material to individual with discussion of falls prevention and safety.   Cardiac Rehab from 10/19/2016 in [facility] Cardiac and Pulmonary Rehab  Date  08/20/16  Educator  [facility]  Instruction Review Code (retired)  2- meets goals/outcomes  Instruction Review Code  1- Science writer Understanding      Diabetes: - Individual verbal and written instruction to review signs/symptoms of diabetes, desired ranges of glucose level fasting, after meals and with exercise. Acknowledge that pre and post exercise glucose checks will be done for 3 sessions at entry of program.   Other: -Provides group and verbal instruction on various topics (see  comments)    Knowledge Questionnaire Score:     Knowledge Questionnaire Score - 08/20/16 1358      Knowledge Questionnaire Score   Pre Score 20/28  Reviewed correct responses with [name] today. She verbalized understanding of the responses.      Core Components/Risk Factors/Patient Goals at Admission:     Personal Goals and Risk Factors at Admission - 08/20/16 1412      Core Components/Risk Factors/Patient Goals on Admission    Weight Management Yes;Obesity;Weight Loss   Admit Weight 317 lb 11.2 oz (144.1 kg)   Goal Weight: Short Term 315 lb (142.9 kg)   Goal Weight: Long Term 250 lb (113.4 kg)   Expected Outcomes Short Term: Continue to assess and modify interventions until short term weight is achieved;Long Term: Adherence to nutrition and physical activity/exercise program aimed toward attainment of established weight goal;Weight Loss: Understanding of general recommendations for a balanced deficit meal plan, which promotes 1-2 lb weight  loss per week and includes a negative energy balance of [phone] kcal/d;Understanding of distribution of calorie intake throughout the day with the consumption of 4-5 meals/snacks;Understanding recommendations for meals to include 15-35% energy as protein, 25-35% energy from fat, 35-60% energy from carbohydrates, less than 235m of dietary cholesterol, 20-35 gm of total fiber daily   Tobacco Cessation Yes   Number of packs per day is smoking about 8 a day   correction SB   Intervention Assist the participant in steps to quit. Provide individualized education and counseling about committing to Tobacco Cessation, relapse prevention, and pharmacological support that can be provided by physician.;OAdvice worker assist with locating and accessing local/national Quit Smoking programs, and support quit date choice.   Expected Outcomes Short Term: Will demonstrate readiness to quit, by selecting a quit date.;Short Term: Will quit all tobacco product use, adhering to prevention of relapse plan.;Long Term: Complete abstinence from all tobacco products for at least 12 months from quit date.   Improve shortness of breath with ADL's Yes   Intervention Provide education, individualized exercise plan and daily activity instruction to help decrease symptoms of SOB with activities of daily living.   Expected Outcomes Short Term: Achieves a reduction of symptoms when performing activities of daily living.   Increase knowledge of respiratory medications and ability to use respiratory devices properly  Yes   Intervention Provide education and demonstration as needed of appropriate use of medications, inhalers, and oxygen therapy.   Expected Outcomes Short Term: Achieves understanding of medications use. Understands that oxygen is a medication prescribed by physician. Demonstrates appropriate use of inhaler and oxygen therapy.   Heart Failure Yes   Intervention Provide a combined exercise and nutrition  program that is supplemented with education, support and counseling about heart failure. Directed toward relieving symptoms such as shortness of breath, decreased exercise tolerance, and extremity edema.   Expected Outcomes Improve functional capacity of life;Short term: Attendance in program 2-3 days a week with increased exercise capacity. Reported lower sodium intake. Reported increased fruit and vegetable intake. Reports medication compliance.;Short term: Daily weights obtained and reported for increase. Utilizing diuretic protocols set by physician.;Long term: Adoption of self-care skills and reduction of barriers for early signs and symptoms recognition and intervention leading to self-care maintenance.   Hypertension Yes   Intervention Provide education on lifestyle modifcations including regular physical activity/exercise, weight management, moderate sodium restriction and increased consumption of fresh fruit, vegetables, and low fat dairy, alcohol moderation, and smoking cessation.;Monitor prescription use compliance.   Expected Outcomes  Short Term: Continued assessment and intervention until BP is < 140/54m HG in hypertensive participants. < 130/847mHG in hypertensive participants with diabetes, heart failure or chronic kidney disease.;Long Term: Maintenance of blood pressure at goal levels.   Lipids Yes   Intervention Provide education and support for participant on nutrition & aerobic/resistive exercise along with prescribed medications to achieve LDL <7068mHDL >42m84m Expected Outcomes Short Term: Participant states understanding of desired cholesterol values and is compliant with medications prescribed. Participant is following exercise prescription and nutrition guidelines.;Long Term: Cholesterol controlled with medications as prescribed, with individualized exercise RX and with personalized nutrition plan. Value goals: LDL < 70mg42mL > 40 mg.      Core Components/Risk Factors/Patient  Goals Review:      Goals and Risk Factor Review    Row Name 09/07/16 0842 09737/18 1025 10/05/16 0837         Core Components/Risk Factors/Patient Goals Review   Personal Goals Review Tobacco Cessation;Weight Management/Obesity;Improve shortness of breath with ADL's;Increase knowledge of respiratory medications and ability to use respiratory devices properly.;Develop more efficient breathing techniques such as purse lipped breathing and diaphragmatic breathing and practicing self-pacing with activity.;Hypertension;Heart Failure;Lipids  - Weight Management/Obesity;Heart Failure;Tobacco Cessation;Hypertension;Improve shortness of breath with ADL's     Review BarbaLillianebeen doing well in rehab.  Her weight has stayed fairly steady.  She has been having some swelling recently but has not let her doctor know. When she did talk to her doctor about her symptoms they told her she was drinking too much water.   We talked about the importance of staying on top of her heart failure symptoms.   Her breathing is actually getting better and she is using her PLB.  Her blood pressures have been pretty good.  She is doing well on her meds.   BarbaAddisenrted that she spoke to her MD about her feet swelling on 8/20 and she said they just said to make sure to take her Furosemide daily. BarbaBernadetterted that she took her maxide this weekend which is prn since her ankles were more swollen.  Weight has been maintained as long as she is not retaining fluid. She has not been eating salt and has decreased her fluid intake. She has been working with her doctors on medications to help control fluid. Lab work has been good and blood pressures have been in acceptable ranges. Dr. is going to give her C-Pap at night. She has been in communication with doctors about heart faliure symptoms. BarbrAmedeo Plentystill been smoke free!     Expected Outcomes Short: Talk to doctor about heart failure symptoms.  Long: Continue to work on weight loss.    - Short: follow though on the c-pap machine for night time use. Long: continue to work on weight loss.         Core Components/Risk Factors/Patient Goals at Discharge (Final Review):      Goals and Risk Factor Review - 10/05/16 0837      Core Components/Risk Factors/Patient Goals Review   Personal Goals Review Weight Management/Obesity;Heart Failure;Tobacco Cessation;Hypertension;Improve shortness of breath with ADL's   Review Weight has been maintained as long as she is not retaining fluid. She has not been eating salt and has decreased her fluid intake. She has been working with her doctors on medications to help control fluid. Lab work has been good and blood pressures have been in acceptable ranges. Dr. is going to give her C-Pap at night. She has  been in communication with doctors about heart faliure symptoms. Amedeo Plenty has still been smoke free!   Expected Outcomes Short: follow though on the c-pap machine for night time use. Long: continue to work on weight loss.       ITP Comments:     ITP Comments    Row Name 08/20/16 1337 08/20/16 1338 08/24/16 0915 08/26/16 0609 08/31/16 0830   ITP Comments Medical review completed today. INitial ITP created for review and changes if required and signature by Dr Sabra Heck.  Diagnosis documentation can be found in [facility] Medical review completed today. INitial ITP created for review and changes if required and signature by Dr Sabra Heck.  Diagnosis documentation can be found in [facility] encounter 08/06/2016 [name] did not complete her rehab session.  Makila came to class today but did not exercise. She stated that she had a cath last week and a blood clot was found in her groin area. She was put on new meds and was supposed to see Dr. Clayborn Bigness today. She was advised by staff to ask Dr. Clayborn Bigness if he advised her to come back to cardiac rehab based on this new diagnosis. She did not exercise and went straight to Dr. Etta Quill office to speak with him. She was  asked to bring back medical clearance prior to coming back to exercise.  30 day review. Continue with ITP unless directed changes per Medical Director review   New to program At the end of [place] told me that she has ankle swelling, [place] had a lot of PVC's even at rest during the 26mnute Nutrition education session which were faxed to Dr. CClayborn Bigness I suggested she stop by/or call  Dr. CMarianna Payment office with a copy of the PVC and tell them fher fee  I also gave BPamala Hurryprinted info about Heart failure and when to call the MD and went over it with her. BGraceleestated she understood and wanted to learn about her heart. Given the Cardiac Rehab education booklet also.    RPaguateName 08/31/16 1148 08/31/16 1205 09/07/16 1022 09/16/16 0857 09/18/16 0810   ITP Comments BPamala Hurryhad "chest tightness" which started when she was walking at 1.584m TM. BaBrunaeported that she recently had a heart cath with no blockage and that is to see the breathing doctor this week. BaLuticiaeported her "chest pressure: decreased wtih rest and eventually went away. BaMykeltiesumed on the treadmill by herself.  [facility]  I took a copy of the multiple PVC's including [place] to Dr. CaEtta Quillffice. FrShubutaesk staff asked to give it to Dr. CaEtta Quillurse who was in a room with a patient. I later checked CARE everywhere and last potassium 2 weeks ago. Did not see a Magnesium or Phosphorus level. No c/o but PVC's last several times inCardiac Rehab.  BaIlyseot tangled up with the dogs and fell in a hole which hurt her back and left leg. She was able to tolerate exercise today. BaCamilahsked us[place]oreao check her blood pressure on both arms today.  She was lower on the right side by 6 mmHg systolic and 10 mmHg on the diastolic.  We encouraged her to talk to her doctor about this.   RoPriceame 09/23/16 1102 10/19/16 0926 10/19/16 1252 10/20/16 1214 10/21/16 0633   ITP Comments 30 day review.  Continue with ITP unless directed changes per Medical Director review.   [facility]  runs of vtach with multifocal PVC c/o 3/10 chest pain when I asked her.  Per [facility] Emergency Dept EPIC/CHL notes Olena will be assessed by Cardiology and admitted to hospital /telemetry.  Discharged from hospital per [facility] notes with follow up appts-Follow-Ups with Cardiology and her primary care MD. 30 day Review. Continue with ITP unless directed changes per Medical Director Review.       Comments:

## 2016-10-23 ENCOUNTER — Encounter: Payer: Medicare Other | Admitting: *Deleted

## 2016-10-23 DIAGNOSIS — I5022 Chronic systolic (congestive) heart failure: Secondary | ICD-10-CM

## 2016-10-23 NOTE — Patient Instructions (Signed)
Discharge Instructions  Patient Details  Name: Wendy Sanchez MRN: [id number] Date of Birth: May 14, 1950 Referring Provider:  Yolonda Kida, MD   Number of Visits: 36/36  Reason for Discharge:  Patient reached a stable level of exercise. Patient independent in their exercise. Patient has met program and personal goals.  Smoking History:  History  Smoking Status  . Former Smoker  . Packs/day: 0.25  . Years: 50.00  . Quit date: 08/22/2016  Smokeless Tobacco  . Former Systems developer  . Types: Snuff, Chew  . Quit date: 01/13/1995    Comment: Planned quit date August 28, 2016    Diagnosis:  Heart failure, chronic systolic [facility])  Initial Exercise Prescription:     Initial Exercise Prescription - 08/20/16 1400      Date of Initial Exercise RX and Referring Provider   Date 08/20/16   Referring Provider Lorrene Reid MD     Treadmill   MPH 1.5   Grade 0   Minutes 15   METs 2.15     Recumbant Elliptical   Level 1   RPM 50   Minutes 15   METs 2     T5 Nustep   Level 1   SPM 80   Minutes 15   METs 2     Prescription Details   Frequency (times per week) 3   Duration Progress to 45 minutes of aerobic exercise without signs/symptoms of physical distress     Intensity   THRR 40-80% of Max Heartrate 117-142   Ratings of Perceived Exertion 11-13   Perceived Dyspnea 0-4     Progression   Progression Continue to progress workloads to maintain intensity without signs/symptoms of physical distress.     Resistance Training   Training Prescription Yes   Weight 2 lbs   Reps 10-15      Discharge Exercise Prescription (Final Exercise Prescription Changes):     Exercise Prescription Changes - 10/15/16 1400      Response to Exercise   Blood Pressure (Admit) 132/68   Blood Pressure (Exercise) 136/74   Blood Pressure (Exit) 132/68   Heart Rate (Admit) 87 bpm   Heart Rate (Exercise) 129 bpm   Heart Rate (Exit) 109 bpm   Rating of Perceived Exertion (Exercise) 15    Symptoms back and ankle pain   Duration Continue with 45 min of aerobic exercise without signs/symptoms of physical distress.   Intensity THRR unchanged     Progression   Progression Continue to progress workloads to maintain intensity without signs/symptoms of physical distress.   Average METs 1.95     Resistance Training   Training Prescription Yes   Weight 4 lbs   Reps 10-15     Interval Training   Interval Training No     Treadmill   MPH 1.5   Grade 0   Minutes 15   METs 2.15     Recumbant Elliptical   Level 1.5   Minutes 15   METs 1.5     T5 Nustep   Level 5   Minutes 15   METs 2.2     Home Exercise Plan   Plans to continue exercise at [employer] (comment)  [employer], walking, pool walking   Frequency Add 2 additional days to program exercise sessions.   Initial Home Exercises Provided 09/07/16      Functional Capacity:     6 Minute Walk    Row Name 08/20/16 1440 10/19/16 0859       6  Minute Walk   Phase Initial Discharge    Distance 830 feet 1025 feet    Distance % Change  - 23 %    Distance Feet Change  - 195 ft    Walk Time 6 minutes 6 minutes    # of Rest Breaks 1  1:02 0    MPH 1.9 1.94    METS 1.22 1.83    RPE 17 15    Perceived Dyspnea  4 2    VO2 Peak 4.29 6.42    Symptoms Yes (comment) Yes (comment)    Comments SOB, right sided chest pain, leg pain 8/10 Left leg pain during last 3 laps (10/10) resolved with rest    Resting HR 91 bpm 78 bpm    Resting BP 124/70 124/60    Exercise Oxygen Saturation  during 6 min walk  - 100 %    Max Ex. HR 118 bpm 132 bpm    Max Ex. BP 128/74 138/68    2 Minute Post BP 116/66  -       Quality of Life:     Quality of Life - 10/23/16 0825      Quality of Life Scores   Health/Function Pre 10.64 %   Health/Function Post 11.5 %   Health/Function % Change 8.08 %   Socioeconomic Pre 11.44 %   Socioeconomic Post 19.64 %   Socioeconomic % Change  71.68 %   Psych/Spiritual Pre  16.29 %   Psych/Spiritual Post 20.57 %   Psych/Spiritual % Change 26.27 %   Family Pre 5.63 %   Family Post 8.5 %   Family % Change 50.98 %   GLOBAL Pre 10.64 %   GLOBAL Post 14.6 %   GLOBAL % Change 37.22 %      Personal Goals: Goals established at orientation with interventions provided to work toward goal.     Personal Goals and Risk Factors at Admission - 08/20/16 1412      Core Components/Risk Factors/Patient Goals on Admission    Weight Management Yes;Obesity;Weight Loss   Admit Weight 317 lb 11.2 oz (144.1 kg)   Goal Weight: Short Term 315 lb (142.9 kg)   Goal Weight: Long Term 250 lb (113.4 kg)   Expected Outcomes Short Term: Continue to assess and modify interventions until short term weight is achieved;Long Term: Adherence to nutrition and physical activity/exercise program aimed toward attainment of established weight goal;Weight Loss: Understanding of general recommendations for a balanced deficit meal plan, which promotes 1-2 lb weight loss per week and includes a negative energy balance of [phone] kcal/d;Understanding of distribution of calorie intake throughout the day with the consumption of 4-5 meals/snacks;Understanding recommendations for meals to include 15-35% energy as protein, 25-35% energy from fat, 35-60% energy from carbohydrates, less than 260m of dietary cholesterol, 20-35 gm of total fiber daily   Tobacco Cessation Yes   Number of packs per day is smoking about 8 a day   correction SB   Intervention Assist the participant in steps to quit. Provide individualized education and counseling about committing to Tobacco Cessation, relapse prevention, and pharmacological support that can be provided by physician.;OAdvice worker assist with locating and accessing local/national Quit Smoking programs, and support quit date choice.   Expected Outcomes Short Term: Will demonstrate readiness to quit, by selecting a quit date.;Short Term: Will quit all  tobacco product use, adhering to prevention of relapse plan.;Long Term: Complete abstinence from all tobacco products for at least 12 months from quit  date.   Improve shortness of breath with ADL's Yes   Intervention Provide education, individualized exercise plan and daily activity instruction to help decrease symptoms of SOB with activities of daily living.   Expected Outcomes Short Term: Achieves a reduction of symptoms when performing activities of daily living.   Increase knowledge of respiratory medications and ability to use respiratory devices properly  Yes   Intervention Provide education and demonstration as needed of appropriate use of medications, inhalers, and oxygen therapy.   Expected Outcomes Short Term: Achieves understanding of medications use. Understands that oxygen is a medication prescribed by physician. Demonstrates appropriate use of inhaler and oxygen therapy.   Heart Failure Yes   Intervention Provide a combined exercise and nutrition program that is supplemented with education, support and counseling about heart failure. Directed toward relieving symptoms such as shortness of breath, decreased exercise tolerance, and extremity edema.   Expected Outcomes Improve functional capacity of life;Short term: Attendance in program 2-3 days a week with increased exercise capacity. Reported lower sodium intake. Reported increased fruit and vegetable intake. Reports medication compliance.;Short term: Daily weights obtained and reported for increase. Utilizing diuretic protocols set by physician.;Long term: Adoption of self-care skills and reduction of barriers for early signs and symptoms recognition and intervention leading to self-care maintenance.   Hypertension Yes   Intervention Provide education on lifestyle modifcations including regular physical activity/exercise, weight management, moderate sodium restriction and increased consumption of fresh fruit, vegetables, and low fat dairy,  alcohol moderation, and smoking cessation.;Monitor prescription use compliance.   Expected Outcomes Short Term: Continued assessment and intervention until BP is < 140/77m HG in hypertensive participants. < 130/841mHG in hypertensive participants with diabetes, heart failure or chronic kidney disease.;Long Term: Maintenance of blood pressure at goal levels.   Lipids Yes   Intervention Provide education and support for participant on nutrition & aerobic/resistive exercise along with prescribed medications to achieve LDL <7028mHDL >48m23m Expected Outcomes Short Term: Participant states understanding of desired cholesterol values and is compliant with medications prescribed. Participant is following exercise prescription and nutrition guidelines.;Long Term: Cholesterol controlled with medications as prescribed, with individualized exercise RX and with personalized nutrition plan. Value goals: LDL < 70mg43mL > 40 mg.       Personal Goals Discharge:     Goals and Risk Factor Review - 10/05/16 0837      Core Components/Risk Factors/Patient Goals Review   Personal Goals Review Weight Management/Obesity;Heart Failure;Tobacco Cessation;Hypertension;Improve shortness of breath with ADL's   Review Weight has been maintained as long as she is not retaining fluid. She has not been eating salt and has decreased her fluid intake. She has been working with her doctors on medications to help control fluid. Lab work has been good and blood pressures have been in acceptable ranges. Dr. is going to give her C-Pap at night. She has been in communication with doctors about heart faliure symptoms. BarbrAmedeo Plentystill been smoke free!   Expected Outcomes Short: follow though on the c-pap machine for night time use. Long: continue to work on weight loss.       Exercise Goals and Review:     Exercise Goals    Row Name 08/20/16 1444             Exercise Goals   Increase Physical Activity Yes        Intervention Provide advice, education, support and counseling about physical activity/exercise needs.;Develop an individualized exercise prescription for aerobic and resistive training  based on initial evaluation findings, risk stratification, comorbidities and participant's personal goals.       Expected Outcomes Achievement of increased cardiorespiratory fitness and enhanced flexibility, muscular endurance and strength shown through measurements of functional capacity and personal statement of participant.       Increase Strength and Stamina Yes       Intervention Provide advice, education, support and counseling about physical activity/exercise needs.;Develop an individualized exercise prescription for aerobic and resistive training based on initial evaluation findings, risk stratification, comorbidities and participant's personal goals.       Expected Outcomes Achievement of increased cardiorespiratory fitness and enhanced flexibility, muscular endurance and strength shown through measurements of functional capacity and personal statement of participant.          Nutrition & Weight - Outcomes:     Pre Biometrics - 10/19/16 0858      Pre Biometrics   Height 5' 7.4" (1.712 m)   Weight (!)  315 lb (142.9 kg)   Waist Circumference 42.5 inches   Hip Circumference 59 inches   Waist to Hip Ratio 0.72 %   BMI (Calculated) 48.75   Single Leg Stand 4.55 seconds       Nutrition:     Nutrition Therapy & Goals - 09/16/16 1836      Nutrition Therapy   Diet basic heart healthy     Personal Nutrition Goals   Nutrition Goal eat every 3-4 hours during the day   Personal Goal #2 Include protein with every meal -- try adding some protein drink to foods such as oatmeal or cream of wheat; can also try powdered peanut butter added to foods.    Personal Goal #3 Avoid drinking fluids during meals to have more room for food.   Personal Goal #4 Fluid goal is 64-100oz daily, and this includes all  fluids (not just water)   Additional Goals? No   Comments Ms. 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 Provided her with 800-1200kcal bariatric menus for meal ideas and to aid her in meeting basic nutrient needs.      Intervention Plan   Intervention Nutrition handout(s) given to patient.      Nutrition Discharge:     Nutrition Assessments - 10/23/16 0825      MEDFICTS Scores   Pre Score 35   Post Score 38   Score Difference 3      Education Questionnaire Score:     Knowledge Questionnaire Score - 10/23/16 0825      Knowledge Questionnaire Score   Pre Score 20/28   Post Score 27/28      Goals reviewed with patient; copy given to patient.

## 2016-10-23 NOTE — Progress Notes (Signed)
Daily Session Note  Patient Details  Name: Wendy Sanchez MRN: [id number] Date of Birth: 07/07/1950 Referring Provider:     Cardiac Rehab from 08/20/2016 in [facility]  Referring Provider  Callwood, Dewayne MD      Encounter Date: 10/23/2016  Check In:     Session Check In - 10/23/16 0801      Check-In   Location [facility]   Staff Present Jessica Hawkins, MA, ACSM RCEP, Exercise Physiologist;Amanda Sommer, BA, ACSM CEP, Exercise Physiologist;Other;Carroll Enterkin, RN, BSN   Supervising physician immediately available to respond to emergencies See telemetry face sheet for immediately available ER MD   Medication changes reported     No   Fall or balance concerns reported    No   Tobacco Cessation No Change   Warm-up and Cool-down Performed on first and last piece of equipment   Resistance Training Performed Yes   VAD Patient? No     Pain Assessment   Currently in Pain? No/denies   Multiple Pain Sites No         History  Smoking Status  . Former Smoker  . Packs/day: 0.25  . Years: 50.00  . Quit date: 08/22/2016  Smokeless Tobacco  . Former User  . Types: Snuff, Chew  . Quit date: 01/13/1995    Comment: Planned quit date August 28, 2016    Goals Met:  Independence with exercise equipment Exercise tolerated well No report of cardiac concerns or symptoms Strength training completed today  Goals Unmet:  Not Applicable  Comments: Pt able to follow exercise prescription today without complaint.  Will continue to monitor for progression. [employer] will be getting [facility] from [facility] [employer]!!   Dr. Mark Miller is Medical Director for [facility] and [facility]. 

## 2016-10-26 ENCOUNTER — Encounter: Payer: Medicare Other | Admitting: *Deleted

## 2016-10-26 DIAGNOSIS — I5022 Chronic systolic (congestive) heart failure: Secondary | ICD-10-CM

## 2016-10-26 NOTE — Progress Notes (Signed)
Discharge Progress Report  Patient Details  Name: Wendy Sanchez MRN: [id number] Date of Birth: 03-17-1950 Referring Provider:     Cardiac Rehab from 08/20/2016 in [facility] Cardiac [facility]  Referring Provider  Lorrene Reid MD       Number of Visits: 16  Reason for Discharge:  Patient reached a stable level of exercise. Patient independent in their exercise. Patient has met program and personal goals.  Smoking History:  History  Smoking Status  . Former Smoker  . Packs/day: 0.25  . Years: 50.00  . Quit date: 08/22/2016  Smokeless Tobacco  . Former Systems developer  . Types: Snuff, Chew  . Quit date: 01/13/1995    Comment: Planned quit date August 28, 2016    Diagnosis:  Heart failure, chronic systolic ([place])  ADL UCSD:   Initial Exercise Prescription:     Initial Exercise Prescription - 08/20/16 1400      Date of Initial Exercise RX and Referring Provider   Date 08/20/16   Referring Provider Lorrene Reid MD     Treadmill   MPH 1.5   Grade 0   Minutes 15   METs 2.15     Recumbant Elliptical   Level 1   RPM 50   Minutes 15   METs 2     T5 Nustep   Level 1   SPM 80   Minutes 15   METs 2     Prescription Details   Frequency (times per week) 3   Duration Progress to 45 minutes of aerobic exercise without signs/symptoms of physical distress     Intensity   THRR 40-80% of Max Heartrate 117-142   Ratings of Perceived Exertion 11-13   Perceived Dyspnea 0-4     Progression   Progression Continue to progress workloads to maintain intensity without signs/symptoms of physical distress.     Resistance Training   Training Prescription Yes   Weight 2 lbs   Reps 10-15      Discharge Exercise Prescription (Final Exercise Prescription Changes):     Exercise Prescription Changes - 10/15/16 1400      Response to Exercise   Blood Pressure (Admit) 132/68   Blood Pressure (Exercise) 136/74   Blood Pressure (Exit) 132/68   Heart Rate (Admit) 87 bpm    Heart Rate (Exercise) 129 bpm   Heart Rate (Exit) 109 bpm   Rating of Perceived Exertion (Exercise) 15   Symptoms back and ankle pain   Duration Continue with 45 min of aerobic exercise without signs/symptoms of physical distress.   Intensity THRR unchanged     Progression   Progression Continue to progress workloads to maintain intensity without signs/symptoms of physical distress.   Average METs 1.95     Resistance Training   Training Prescription Yes   Weight 4 lbs   Reps 10-15     Interval Training   Interval Training No     Treadmill   MPH 1.5   Grade 0   Minutes 15   METs 2.15     Recumbant Elliptical   Level 1.5   Minutes 15   METs 1.5     T5 Nustep   Level 5   Minutes 15   METs 2.2     Home Exercise Plan   Plans to continue exercise at [employer] (comment)  [employer], walking, pool walking   Frequency Add 2 additional days to program exercise sessions.   Initial Home Exercises Provided 09/07/16  Functional Capacity:     6 Minute Walk    Row Name 08/20/16 1440 10/19/16 0859       6 Minute Walk   Phase Initial Discharge    Distance 830 feet 1025 feet    Distance % Change  - 23 %    Distance Feet Change  - 195 ft    Walk Time 6 minutes 6 minutes    # of Rest Breaks 1  1:02 0    MPH 1.9 1.94    METS 1.22 1.83    RPE 17 15    Perceived Dyspnea  4 2    VO2 Peak 4.29 6.42    Symptoms Yes (comment) Yes (comment)    Comments SOB, right sided chest pain, leg pain 8/10 Left leg pain during last 3 laps (10/10) resolved with rest    Resting HR 91 bpm 78 bpm    Resting BP 124/70 124/60    Exercise Oxygen Saturation  during 6 min walk  - 100 %    Max Ex. HR 118 bpm 132 bpm    Max Ex. BP 128/74 138/68    2 Minute Post BP 116/66  -       Psychological, QOL, Others - Outcomes: PHQ 2/9: Depression screen [facility] 2/9 10/23/2016 09/08/2016 09/07/2016 08/20/2016 07/16/2016  Decreased Interest 0 0 0 0 0  Down, Depressed, Hopeless 0 0 0 0 0   PHQ - 2 Score 0 0 0 0 0  Altered sleeping 1 - 3 3 -  Tired, decreased energy 2 - 0 2 -  Change in appetite 2 - 3 3 -  Feeling bad or failure about yourself  0 - 0 0 -  Trouble concentrating 0 - 0 0 -  Moving slowly or fidgety/restless 0 - 0 0 -  Suicidal thoughts 0 - 0 0 -  PHQ-9 Score 5 - 6 8 -  Difficult doing work/chores Somewhat difficult - Somewhat difficult Very difficult -    Quality of Life:     Quality of Life - 10/23/16 0825      Quality of Life Scores   Health/Function Pre 10.64 %   Health/Function Post 11.5 %   Health/Function % Change 8.08 %   Socioeconomic Pre 11.44 %   Socioeconomic Post 19.64 %   Socioeconomic % Change  71.68 %   Psych/Spiritual Pre 16.29 %   Psych/Spiritual Post 20.57 %   Psych/Spiritual % Change 26.27 %   Family Pre 5.63 %   Family Post 8.5 %   Family % Change 50.98 %   GLOBAL Pre 10.64 %   GLOBAL Post 14.6 %   GLOBAL % Change 37.22 %      Personal Goals: Goals established at orientation with interventions provided to work toward goal.     Personal Goals and Risk Factors at Admission - 08/20/16 1412      Core Components/Risk Factors/Patient Goals on Admission    Weight Management Yes;Obesity;Weight Loss   Admit Weight 317 lb 11.2 oz (144.1 kg)   Goal Weight: Short Term 315 lb (142.9 kg)   Goal Weight: Long Term 250 lb (113.4 kg)   Expected Outcomes Short Term: Continue to assess and modify interventions until short term weight is achieved;Long Term: Adherence to nutrition and physical activity/exercise program aimed toward attainment of established weight goal;Weight Loss: Understanding of general recommendations for a balanced deficit meal plan, which promotes 1-2 lb weight loss per week and includes a negative energy balance of [id number] kcal/d;Understanding  of distribution of calorie intake throughout the day with the consumption of 4-5 meals/snacks;Understanding recommendations for meals to include 15-35% energy as protein, 25-35%  energy from fat, 35-60% energy from carbohydrates, less than 200mg of dietary cholesterol, 20-35 gm of total fiber daily   Tobacco Cessation Yes   Number of packs per day is smoking about 8 a day   correction SB   Intervention Assist the participant in steps to quit. Provide individualized education and counseling about committing to Tobacco Cessation, relapse prevention, and pharmacological support that can be provided by physician.;Offer self-teaching materials, assist with locating and accessing local/national Quit Smoking programs, and support quit date choice.   Expected Outcomes Short Term: Will demonstrate readiness to quit, by selecting a quit date.;Short Term: Will quit all tobacco product use, adhering to prevention of relapse plan.;Long Term: Complete abstinence from all tobacco products for at least 12 months from quit date.   Improve shortness of breath with ADL's Yes   Intervention Provide education, individualized exercise plan and daily activity instruction to help decrease symptoms of SOB with activities of daily living.   Expected Outcomes Short Term: Achieves a reduction of symptoms when performing activities of daily living.   Increase knowledge of respiratory medications and ability to use respiratory devices properly  Yes   Intervention Provide education and demonstration as needed of appropriate use of medications, inhalers, and oxygen therapy.   Expected Outcomes Short Term: Achieves understanding of medications use. Understands that oxygen is a medication prescribed by physician. Demonstrates appropriate use of inhaler and oxygen therapy.   Heart Failure Yes   Intervention Provide a combined exercise and nutrition program that is supplemented with education, support and counseling about heart failure. Directed toward relieving symptoms such as shortness of breath, decreased exercise tolerance, and extremity edema.   Expected Outcomes Improve functional capacity of life;Short  term: Attendance in program 2-3 days a week with increased exercise capacity. Reported lower sodium intake. Reported increased fruit and vegetable intake. Reports medication compliance.;Short term: Daily weights obtained and reported for increase. Utilizing diuretic protocols set by physician.;Long term: Adoption of self-care skills and reduction of barriers for early signs and symptoms recognition and intervention leading to self-care maintenance.   Hypertension Yes   Intervention Provide education on lifestyle modifcations including regular physical activity/exercise, weight management, moderate sodium restriction and increased consumption of fresh fruit, vegetables, and low fat dairy, alcohol moderation, and smoking cessation.;Monitor prescription use compliance.   Expected Outcomes Short Term: Continued assessment and intervention until BP is < 140/90mm HG in hypertensive participants. < 130/80mm HG in hypertensive participants with diabetes, heart failure or chronic kidney disease.;Long Term: Maintenance of blood pressure at goal levels.   Lipids Yes   Intervention Provide education and support for participant on nutrition & aerobic/resistive exercise along with prescribed medications to achieve LDL <70mg, HDL >40mg.   Expected Outcomes Short Term: Participant states understanding of desired cholesterol values and is compliant with medications prescribed. Participant is following exercise prescription and nutrition guidelines.;Long Term: Cholesterol controlled with medications as prescribed, with individualized exercise RX and with personalized nutrition plan. Value goals: LDL < 70mg, HDL > 40 mg.       Personal Goals Discharge:     Goals and Risk Factor Review    Row Name 09/07/16 0842 09/07/16 1025 10/05/16 0837         Core Components/Risk Factors/Patient Goals Review   Personal Goals Review Tobacco Cessation;Weight Management/Obesity;Improve shortness of breath with ADL's;Increase  knowledge of respiratory medications   and ability to use respiratory devices properly.;Develop more efficient breathing techniques such as purse lipped breathing and diaphragmatic breathing and practicing self-pacing with activity.;Hypertension;Heart Failure;Lipids  - Weight Management/Obesity;Heart Failure;Tobacco Cessation;Hypertension;Improve shortness of breath with ADL's     Review Kymani has been doing well in rehab.  Her weight has stayed fairly steady.  She has been having some swelling recently but has not let her doctor know. When she did talk to her doctor about her symptoms they told her she was drinking too much water.   We talked about the importance of staying on top of her heart failure symptoms.   Her breathing is actually getting better and she is using her PLB.  Her blood pressures have been pretty good.  She is doing well on her meds.   Teia reported that she spoke to her MD about her feet swelling on 8/20 and she said they just said to make sure to take her Furosemide daily. Belem reported that she took her maxide this weekend which is prn since her ankles were more swollen.  Weight has been maintained as long as she is not retaining fluid. She has not been eating salt and has decreased her fluid intake. She has been working with her doctors on medications to help control fluid. Lab work has been good and blood pressures have been in acceptable ranges. Dr. is going to give her C-Pap at night. She has been in communication with doctors about heart faliure symptoms. Amedeo Plenty has still been smoke free!     Expected Outcomes Short: Talk to doctor about heart failure symptoms.  Long: Continue to work on weight loss.   - Short: follow though on the c-pap machine for night time use. Long: continue to work on weight loss.         Exercise Goals and Review:     Exercise Goals    Row Name 08/20/16 1444             Exercise Goals   Increase Physical Activity Yes       Intervention  Provide advice, education, support and counseling about physical activity/exercise needs.;Develop an individualized exercise prescription for aerobic and resistive training based on initial evaluation findings, risk stratification, comorbidities and participant's personal goals.       Expected Outcomes Achievement of increased cardiorespiratory fitness and enhanced flexibility, muscular endurance and strength shown through measurements of functional capacity and personal statement of participant.       Increase Strength and Stamina Yes       Intervention Provide advice, education, support and counseling about physical activity/exercise needs.;Develop an individualized exercise prescription for aerobic and resistive training based on initial evaluation findings, risk stratification, comorbidities and participant's personal goals.       Expected Outcomes Achievement of increased cardiorespiratory fitness and enhanced flexibility, muscular endurance and strength shown through measurements of functional capacity and personal statement of participant.          Nutrition & Weight - Outcomes:     Pre Biometrics - 10/19/16 0858      Pre Biometrics   Height 5' 7.4" (1.712 m)   Weight (!)  315 lb (142.9 kg)   Waist Circumference 42.5 inches   Hip Circumference 59 inches   Waist to Hip Ratio 0.72 %   BMI (Calculated) 48.75   Single Leg Stand 4.55 seconds       Nutrition:     Nutrition Therapy & Goals - 09/16/16 1836      Nutrition  Therapy   Diet basic heart healthy     Personal Nutrition Goals   Nutrition Goal eat every 3-4 hours during the day   Personal Goal #2 Include protein with every meal -- try adding some protein drink to foods such as oatmeal or cream of wheat; can also try powdered peanut butter added to foods.    Personal Goal #3 Avoid drinking fluids during meals to have more room for food.   Personal Goal #4 Fluid goal is 64-100oz daily, and this includes all fluids (not just  water)   Additional Goals? No   Comments Ms. 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 Provided her with 800-1200kcal bariatric menus for meal ideas and to aid her in meeting basic nutrient needs.      Intervention Plan   Intervention Nutrition handout(s) given to patient.      Nutrition Discharge:     Nutrition Assessments - 10/23/16 0825      MEDFICTS Scores   Pre Score 35   Post Score 38   Score Difference 3      Education Questionnaire Score:     Knowledge Questionnaire Score - 10/23/16 0825      Knowledge Questionnaire Score   Pre Score 20/28   Post Score 27/28      Goals reviewed with patient; copy given to patient.

## 2016-10-26 NOTE — Progress Notes (Signed)
Daily Session Note  Patient Details  Name: Wendy Sanchez MRN: [id number] Date of Birth: [birth year] Referring Provider:     Cardiac Rehab from 08/20/2016 in [facility] Cardiac [facility] Pulmonary Rehab  Referring Provider  Wendy Reid MD      Encounter Date: 10/26/2016  Check In:     Session Check In - 10/26/16 0842      Check-In   Location [facility]-Cardiac & Pulmonary Rehab   Staff Present Wendy Sanchez, BS, ACSM CEP, Exercise Physiologist;Wendy Enterkin, RN, BSN;Wendy Sanchez, BS, RRT, Respiratory Therapist   Supervising physician immediately available to respond to emergencies See telemetry face sheet for immediately available ER MD   Medication changes reported     No   Fall or balance concerns reported    No   Warm-up and Cool-down Performed on first and last piece of equipment   Resistance Training Performed Yes   VAD Patient? No     Pain Assessment   Currently in Pain? No/denies   Multiple Pain Sites No         History  Smoking Status  . Former Smoker  . Packs/day: 0.25  . Years: 50.00  . Quit date: 08/22/2016  Smokeless Tobacco  . Former Systems developer  . Types: Snuff, Chew  . Quit date: 01/13/1995    Comment: Planned quit date August 28, 2016    Goals Met:  Independence with exercise equipment Exercise tolerated well Personal goals reviewed No report of cardiac concerns or symptoms Strength training completed today  Goals Unmet:  Not Applicable  Comments:  Wendy Sanchez graduated today from cardiac rehab with 36 sessions completed.  Details of the patient's exercise prescription and what She needs to do in order to continue the prescription and progress were discussed with patient.  Patient was given a copy of prescription and goals.  Patient verbalized understanding.  Wendy Sanchez plans to continue to exercise by exercising at a community facility and possibly [employer].    Dr. Emily Filbert is Medical Director for [place] and [facility].

## 2016-10-26 NOTE — Progress Notes (Signed)
Cardiac Individual Treatment Plan  Patient Details  Name: Wendy Sanchez MRN: [id number] Date of Birth: [birth year] Referring Provider:     Cardiac Rehab from 08/20/2016 in [facility] Cardiac and Pulmonary Rehab  Referring Provider  Lorrene Reid MD      Initial Encounter Date:    Cardiac Rehab from 08/20/2016 in [facility] Cardiac and Pulmonary Rehab  Date  08/20/16  Referring Provider  Lorrene Reid MD      Visit Diagnosis: Heart failure, chronic systolic ([place])  Patient's Home Medications on Admission:  Current Outpatient Prescriptions:  .  albuterol (PROVENTIL HFA;VENTOLIN HFA) 108 (90 Base) MCG/ACT inhaler, Inhale 2 puffs into the lungs every 6 (six) hours as needed for wheezing or shortness of breath., Disp: , Rfl:  .  aspirin EC 325 MG EC tablet, Take 1 tablet (325 mg total) by mouth daily., Disp: 30 tablet, Rfl: 0 .  carvedilol (COREG) 3.125 MG tablet, Take 3.125 mg by mouth 2 (two) times daily with a meal. , Disp: , Rfl:  .  clotrimazole (LOTRIMIN) 1 % cream, Apply 1 application topically 2 (two) times daily as needed (rash). , Disp: , Rfl:  .  cyclobenzaprine (FLEXERIL) 10 MG tablet, Take 1 tablet (10 mg total) by mouth 2 (two) times daily. for muscle spams (Patient taking differently: Take 10 mg by mouth 3 (three) times daily. for muscle spams), Disp: 60 tablet, Rfl: 3 .  dexlansoprazole (DEXILANT) 60 MG capsule, Take 60 mg by mouth daily., Disp: , Rfl:  .  diclofenac sodium (VOLTAREN) 1 % GEL, Apply 2 g topically 4 (four) times daily as needed (pain). , Disp: , Rfl:  .  docusate sodium (COLACE) 100 MG capsule, Take 200 mg by mouth at bedtime. , Disp: , Rfl:  .  fluticasone (FLONASE) 50 MCG/ACT nasal spray, Place 1 spray into both nostrils daily., Disp: , Rfl:  .  Fluticasone-Salmeterol (ADVAIR) 250-50 MCG/DOSE AEPB, Inhale 1 puff into the lungs 2 (two) times daily., Disp: , Rfl:  .  folic acid (FOLVITE) 1 MG tablet, Take 1 mg by mouth at bedtime. , Disp: , Rfl:  .  furosemide  (LASIX) 40 MG tablet, Take 40 mg by mouth daily. , Disp: , Rfl:  .  gabapentin (NEURONTIN) 300 MG capsule, Take 2 capsules (600 mg total) by mouth 3 (three) times daily. (Patient taking differently: Take 600 mg by mouth 2 (two) times daily. May take an additional 615m as needed for pain), Disp: 180 capsule, Rfl: 1 .  ibuprofen (ADVIL,MOTRIN) 800 MG tablet, 800 mg every 8 (eight) hours as needed. , Disp: , Rfl:  .  levocetirizine (XYZAL) 5 MG tablet, Take 5 mg by mouth at bedtime. , Disp: , Rfl:  .  mirabegron ER (MYRBETRIQ) 50 MG TB24 tablet, Take 50 mg by mouth daily., Disp: , Rfl:  .  montelukast (SINGULAIR) 10 MG tablet, Take 10 mg by mouth at bedtime., Disp: , Rfl:  .  Multiple Vitamins-Minerals (ICAPS AREDS 2) CAPS, Take 1 capsule by mouth 2 (two) times daily. , Disp: , Rfl:  .  Multiple Vitamins-Minerals (MULTIVITAMIN GUMMIES ADULTS) CHEW, Chew 2 tablets by mouth daily. , Disp: , Rfl:  .  nortriptyline (PAMELOR) 50 MG capsule, Take 50 mg by mouth at bedtime., Disp: , Rfl:  .  Omega-3 Fatty Acids (FISH OIL) 1000 MG CAPS, Take 10022mevery 3rd day, Disp: , Rfl:  .  oxyCODONE-acetaminophen (PERCOCET) 7.5-325 MG tablet, Take 1 tablet by mouth every 6 (six) hours as needed for severe pain.,  Disp: 120 tablet, Rfl: 0 .  potassium chloride SA (K-DUR,KLOR-CON) 20 MEQ tablet, Take 20 mEq by mouth every other day. Take 20 meq every third day, Disp: , Rfl:  .  Probiotic Product (PROBIOTIC COLON SUPPORT) CAPS, Take 1 capsule by mouth at bedtime., Disp: , Rfl:  .  ramipril (ALTACE) 10 MG capsule, Take 10 mg by mouth daily. , Disp: , Rfl:  .  sucralfate (CARAFATE) 1 G tablet, Take 1 g by mouth 2 (two) times daily as needed (esophageal burning). , Disp: , Rfl:  .  tiotropium (SPIRIVA) 18 MCG inhalation capsule, Place into inhaler and inhale., Disp: , Rfl:  .  Vitamin D, Ergocalciferol, (DRISDOL) 50000 UNITS CAPS capsule, Take 50,000 Units by mouth every 7 (seven) days. thursdays, Disp: , Rfl:   Past Medical  History: Past Medical History:  Diagnosis Date  . Allergic rhinitis   . Arthritis   . Asthma    problems with fumes and aerosols cause asthma  . Complication of anesthesia    awakens during surgery; has occurred with last 3-4 surgeries   . Environmental allergies    fumes   . Fibromyalgia   . GERD (gastroesophageal reflux disease)   . History of bronchitis   . Hypertension   . Numbness    hands bilat when driving; improves when not preforming task   . Status post gastric banding   . Tinnitus    comes and goes   . Vertigo    6 months ago approx     Tobacco Use: History  Smoking Status  . Former Smoker  . Packs/day: 0.25  . Years: 50.00  . Quit date: 08/22/2016  Smokeless Tobacco  . Former Systems developer  . Types: Snuff, Chew  . Quit date: 01/13/1995    Comment: Planned quit date August 28, 2016    Labs: Recent Review Scientist, physiological    Labs for ITP Cardiac and Pulmonary Rehab 03/16/2007   Cholestrol 236 ATP III CLASSIFICATION: <200     mg/dL   Desirable 200-239  mg/dL   Borderline High >=240    mg/dL   High(H)   LDLCALC 150 Total Cholesterol/HDL:CHD Risk Coronary Heart Disease Risk Table Men   Women 1/2 Average Risk   3.4   3.3(H)   HDL 71   Trlycerides 76       Exercise Target Goals:    Exercise Program Goal: Individual exercise prescription set with THRR, safety & activity barriers. Participant demonstrates ability to understand and report RPE using BORG scale, to self-measure pulse accurately, and to acknowledge the importance of the exercise prescription.  Exercise Prescription Goal: Starting with aerobic activity 30 plus minutes a day, 3 days per week for initial exercise prescription. Provide home exercise prescription and guidelines that participant acknowledges understanding prior to discharge.  Activity Barriers & Risk Stratification:     Activity Barriers & Cardiac Risk Stratification - 08/20/16 1410      Activity Barriers & Cardiac Risk Stratification    Activity Barriers Arthritis;Left Knee Replacement;Right Knee Replacement;Shortness of Breath;Muscular Weakness;Deconditioning;Joint Problems;Fibromyalgia  Knee replacements were done in 2017.  Right shoulder rotator cuff repair, removal of bone spurs and other repair done. Has ganglion on left wrist. Arthritis Right ankle   Cardiac Risk Stratification High      6 Minute Walk:     6 Minute Walk    Row Name 08/20/16 1440 10/19/16 0859       6 Minute Walk   Phase Initial Discharge    Distance  830 feet 1025 feet    Distance % Change  - 23 %    Distance Feet Change  - 195 ft    Walk Time 6 minutes 6 minutes    # of Rest Breaks 1  1:02 0    MPH 1.9 1.94    METS 1.22 1.83    RPE 17 15    Perceived Dyspnea  4 2    VO2 Peak 4.29 6.42    Symptoms Yes (comment) Yes (comment)    Comments SOB, right sided chest pain, leg pain 8/10 Left leg pain during last 3 laps (10/10) resolved with rest    Resting HR 91 bpm 78 bpm    Resting BP 124/70 124/60    Exercise Oxygen Saturation  during 6 min walk  - 100 %    Max Ex. HR 118 bpm 132 bpm    Max Ex. BP 128/74 138/68    2 Minute Post BP 116/66  -       Oxygen Initial Assessment:   Oxygen Re-Evaluation:   Oxygen Discharge (Final Oxygen Re-Evaluation):   Initial Exercise Prescription:     Initial Exercise Prescription - 08/20/16 1400      Date of Initial Exercise RX and Referring Provider   Date 08/20/16   Referring Provider Lorrene Reid MD     Treadmill   MPH 1.5   Grade 0   Minutes 15   METs 2.15     Recumbant Elliptical   Level 1   RPM 50   Minutes 15   METs 2     T5 Nustep   Level 1   SPM 80   Minutes 15   METs 2     Prescription Details   Frequency (times per week) 3   Duration Progress to 45 minutes of aerobic exercise without signs/symptoms of physical distress     Intensity   THRR 40-80% of Max Heartrate 117-142   Ratings of Perceived Exertion 11-13   Perceived Dyspnea 0-4     Progression    Progression Continue to progress workloads to maintain intensity without signs/symptoms of physical distress.     Resistance Training   Training Prescription Yes   Weight 2 lbs   Reps 10-15      Perform Capillary Blood Glucose checks as needed.  Exercise Prescription Changes:     Exercise Prescription Changes    Row Name 08/20/16 1300 09/01/16 1500 09/07/16 0900 09/16/16 1500 09/30/16 1500     Response to Exercise   Blood Pressure (Admit) 124/70 126/76  - 146/84 124/70   Blood Pressure (Exercise) 128/74 164/70  - 140/70 126/70   Blood Pressure (Exit) 116/66 130/70  - 106/58 134/70   Heart Rate (Admit) 91 bpm 107 bpm  - 106 bpm 90 bpm   Heart Rate (Exercise) 121 bpm 140 bpm  - 111 bpm 136 bpm   Heart Rate (Exit) 101 bpm 95 bpm  - 104 bpm 88 bpm   Oxygen Saturation (Admit) 100 %  -  -  -  -   Oxygen Saturation (Exercise) 98 %  -  -  -  -   Rating of Perceived Exertion (Exercise) 17 15  - 14 17   Symptoms SOB, right sided chest pain, leg pain 8/10 chest tightness  - back pain back and ankle pain   Comments walk test results 2nd full day of exercise  -  -  -   Duration  - Progress to 45 minutes of aerobic  exercise without signs/symptoms of physical distress  - Progress to 45 minutes of aerobic exercise without signs/symptoms of physical distress Continue with 45 min of aerobic exercise without signs/symptoms of physical distress.   Intensity  - THRR unchanged  - THRR unchanged THRR unchanged     Progression   Progression  - Continue to progress workloads to maintain intensity without signs/symptoms of physical distress.  - Continue to progress workloads to maintain intensity without signs/symptoms of physical distress. Continue to progress workloads to maintain intensity without signs/symptoms of physical distress.   Average METs  - 1.92  - 1.88 1.98     Resistance Training   Training Prescription  - Yes  - Yes Yes   Weight  - 2 lbs  - 2 lbs 3 lbs   Reps  - 10-15  - 10-15 10-15      Interval Training   Interval Training  - No  - No No     Treadmill   MPH  - 1.5  - 1.5 1.5   Grade  - 0  - 0 0   Minutes  - 15  - 15 10   METs  - 2.15  - 2.15 2.15     Recumbant Elliptical   Level  - 1  - 1.5 1.5   Minutes  - 15  - 15 15   METs  - 1.6  - 1.4 1.4     T5 Nustep   Level  - 3  - 5 5   Minutes  - 15  - 15 15   METs  - 2  - 2.4 2.4     Home Exercise Plan   Plans to continue exercise at  -  - [employer] (comment)  [employer], walking, pool walking [employer] (comment)  [employer], walking, pool walking [employer] (comment)  [employer], walking, pool walking   Frequency  -  - Add 2 additional days to program exercise sessions. Add 2 additional days to program exercise sessions. Add 2 additional days to program exercise sessions.   Initial Home Exercises Provided  -  - 09/07/16 09/07/16 09/07/16   Row Name 10/15/16 1400             Response to Exercise   Blood Pressure (Admit) 132/68       Blood Pressure (Exercise) 136/74       Blood Pressure (Exit) 132/68       Heart Rate (Admit) 87 bpm       Heart Rate (Exercise) 129 bpm       Heart Rate (Exit) 109 bpm       Rating of Perceived Exertion (Exercise) 15       Symptoms back and ankle pain       Duration Continue with 45 min of aerobic exercise without signs/symptoms of physical distress.       Intensity THRR unchanged         Progression   Progression Continue to progress workloads to maintain intensity without signs/symptoms of physical distress.       Average METs 1.95         Resistance Training   Training Prescription Yes       Weight 4 lbs       Reps 10-15         Interval Training   Interval Training No         Treadmill   MPH 1.5       Grade 0  Minutes 15       METs 2.15         Recumbant Elliptical   Level 1.5       Minutes 15       METs 1.5         T5 Nustep   Level 5       Minutes 15       METs 2.2         Home Exercise Plan    Plans to continue exercise at [employer] (comment)  [employer], walking, pool walking       Frequency Add 2 additional days to program exercise sessions.       Initial Home Exercises Provided 09/07/16          Exercise Comments:     Exercise Comments    Row Name 08/26/16 0900 08/31/16 1150 09/16/16 0856 10/23/16 0803 10/26/16 0844   Exercise Comments First full day of exercise!  Patient was oriented to gym and equipment including functions, settings, policies, and procedures.  Patient's individual exercise prescription and treatment plan were reviewed.  All starting workloads were established based on the results of the 6 minute walk test done at initial orientation visit.  The plan for exercise progression was also introduced and progression will be customized based on patient's performance and goals. Jermia had "chest tightness" which started when she was walking at 1.18mh TM. BCharleereported that she recently had a heart cath with no blockage and that is to see the breathing doctor this week. BElizabethannreported her "chest pressure: decreased wtih rest and eventually went away. BJoellaresumed on the treadmill by herself.  BRavongot tangled up with the dogs and fell in a hole which hurt her back and left leg. She was able to tolerate exercise today. BAleighnawill be getting new shoes from the Foundation!!  BDwanagraduated today from cardiac rehab with 36 sessions completed.  Details of the patient's exercise prescription and what She needs to do in order to continue the prescription and progress were discussed with patient.  Patient was given a copy of prescription and goals.  Patient verbalized understanding.  BErsiliaplans to continue to exercise by exercising at a community facility and possibly [employer]      Exercise Goals and Review:     Exercise Goals    Row Name 08/20/16 1444             Exercise Goals   Increase Physical Activity Yes       Intervention  Provide advice, education, support and counseling about physical activity/exercise needs.;Develop an individualized exercise prescription for aerobic and resistive training based on initial evaluation findings, risk stratification, comorbidities and participant's personal goals.       Expected Outcomes Achievement of increased cardiorespiratory fitness and enhanced flexibility, muscular endurance and strength shown through measurements of functional capacity and personal statement of participant.       Increase Strength and Stamina Yes       Intervention Provide advice, education, support and counseling about physical activity/exercise needs.;Develop an individualized exercise prescription for aerobic and resistive training based on initial evaluation findings, risk stratification, comorbidities and participant's personal goals.       Expected Outcomes Achievement of increased cardiorespiratory fitness and enhanced flexibility, muscular endurance and strength shown through measurements of functional capacity and personal statement of participant.          Exercise Goals Re-Evaluation :     Exercise Goals Re-Evaluation  [place] Name 09/01/16 1541 09/07/16 0901 09/07/16 0905 09/16/16 1551 09/30/16 1540     Exercise Goal Re-Evaluation   Exercise Goals Review Increase Strenth and Stamina;Increase Physical Activity Knowledge and understanding of Target Heart Rate Range (THRR);Able to check pulse independently;Understanding of Exercise Prescription;Increase Physical Activity;Able to understand and use rate of perceived exertion (RPE) scale Increase Physical Activity;Increase Strength and Stamina;Understanding of Exercise Prescription Increase Physical Activity;Increase Strength and Stamina Increase Physical Activity;Increase Strength and Stamina   Comments Nashiya is off to a good start in rehab.  She did have some chest tightness on treadmill on her second day of exercise.  We will continue to monitor her  progression.   Reviewed home exercise with pt today.  Pt plans to walk and go to [employer] for exercise.  Reviewed THR, pulse, RPE, sign and symptoms, NTG use, and when to call 911 or MD.  Also discussed weather considerations and indoor options.  Pt voiced understanding. Celie has been doing well in rehab.  She mentioned that her first week she was so sore that she thought she had a UTI. She continues to have low back pain, but moving does make it feel better.  She has already started walking some at home and has made it half way around her pond. She is determined to get all the away around.  Percy continues to do well in rehab.  She pulled a muscle in her back and twisted her ankle over the weekend.  She did not do the treadmill today as a results.  She is makeing some progress.  We will continue to monitor her progression.  Prudie continues to do the best she can in rehab.  She continues to have pain is her back and ankle.  She was not able to stay on the treadmill the whole time, but did do extra time on treadmill.  She does not want to take extra medication to manage her pain so we discussed alternatives for pain management including chiropractor or acupuncture.  We also discussed getting new shoes to better support her feet and ankle especially since her current shoes are over eight years old.  We will continue to monitor her progress.    Expected Outcomes Short: Work on building up stamina and increasing time on treadmill.  Long: Continue to work on increasing physcial activity.  Short: Continue to walk at home and start to go back to pool at [facility].  Long: Make exercise part of daily routine.  Short: Start to exercise more at home.  Long: Continue to exercise daily. Short: Continue to try to add in exercise at home.   Long: Make exercise part of routine.  Short: Look into new shoes and alternate pain management strategies  Long: Exercise more at home.    [place] Name 10/05/16 9326 10/15/16 1445  10/19/16 0903         Exercise Goal Re-Evaluation   Exercise Goals Review Increase Strength and Stamina;Increase Physical Activity;Understanding of Exercise Prescription Increase Strength and Stamina;Increase Physical Activity Increase Physical Activity;Increase Strength and Stamina     Comments Continues to consistantly attend cardiac rehab and exercise at home. She has been doingwalking,  wall sits, lunges, and wall pushups, and other strength exercises at home at home. Stated she is feeling stronger. SOB is the main limiting factor with activity.  She stated she can not afford new shoes right now, but staff is going to work with the foundation to see if she can get financial assistance for new  shoes.  Amaia has continued to do well in rehab.  She is feeling stronger, but continues to have back, knee, and ankle pain.  She has gone to pick out her shoes and is just waiting for foundation to okay payment.  She is due to graduate soon.  We will continue to monitor her progres. 6 min walk test was done with patient today. She improved by 23%/195 ft. from initial test. See 6 min walk data sheet for detailed report. Results were reviewed with patient.      Expected Outcomes Short: work with staff and foundation to get a new pair of shoes Long: continue to increase actitiy at home.  Short: Improve her post 6MWT.  Long: Continue to increase activity at home.   -        Discharge Exercise Prescription (Final Exercise Prescription Changes):     Exercise Prescription Changes - 10/15/16 1400      Response to Exercise   Blood Pressure (Admit) 132/68   Blood Pressure (Exercise) 136/74   Blood Pressure (Exit) 132/68   Heart Rate (Admit) 87 bpm   Heart Rate (Exercise) 129 bpm   Heart Rate (Exit) 109 bpm   Rating of Perceived Exertion (Exercise) 15   Symptoms back and ankle pain   Duration Continue with 45 min of aerobic exercise without signs/symptoms of physical distress.   Intensity THRR unchanged      Progression   Progression Continue to progress workloads to maintain intensity without signs/symptoms of physical distress.   Average METs 1.95     Resistance Training   Training Prescription Yes   Weight 4 lbs   Reps 10-15     Interval Training   Interval Training No     Treadmill   MPH 1.5   Grade 0   Minutes 15   METs 2.15     Recumbant Elliptical   Level 1.5   Minutes 15   METs 1.5     T5 Nustep   Level 5   Minutes 15   METs 2.2     Home Exercise Plan   Plans to continue exercise at [employer] (comment)  [employer], walking, pool walking   Frequency Add 2 additional days to program exercise sessions.   Initial Home Exercises Provided 09/07/16      Nutrition:  Target Goals: Understanding of nutrition guidelines, daily intake of sodium <151m, cholesterol <2028m calories 30% from fat and 7% or less from saturated fats, daily to have 5 or more servings of fruits and vegetables.  Biometrics:     Pre Biometrics - 10/19/16 0858      Pre Biometrics   Height 5' 7.4" (1.712 m)   Weight (!)  315 lb (142.9 kg)   Waist Circumference 42.5 inches   Hip Circumference 59 inches   Waist to Hip Ratio 0.72 %   BMI (Calculated) 48.75   Single Leg Stand 4.55 seconds       Nutrition Therapy Plan and Nutrition Goals:     Nutrition Therapy & Goals - 09/16/16 1836      Nutrition Therapy   Diet basic heart healthy     Personal Nutrition Goals   Nutrition Goal eat every 3-4 hours during the day   Personal Goal #2 Include protein with every meal -- try adding some protein drink to foods such as oatmeal or cream of wheat; can also try powdered peanut butter added to foods.    Personal Goal #3 Avoid drinking fluids during meals  to have more room for food.   Personal Goal #4 Fluid goal is 64-100oz daily, and this includes all fluids (not just water)   Additional Goals? No   Comments Ms. Elliff reports frequently skipping meals, and is unable to eat much  at any one time due to gastric band. Provided her with 800-1200kcal bariatric menus for meal ideas and to aid her in meeting basic nutrient needs.      Intervention Plan   Intervention Nutrition handout(s) given to patient.      Nutrition Discharge: Rate Your Plate Scores:     Nutrition Assessments - 10/23/16 0825      MEDFICTS Scores   Pre Score 35   Post Score 38   Score Difference 3      Nutrition Goals Re-Evaluation:     Nutrition Goals Re-Evaluation    Row Name 09/07/16 0850 10/05/16 0842           Goals   Current Weight 317 lb 4.8 oz (143.9 kg) 317 lb (143.8 kg)      Nutrition Goal Eat more frequent meals eat every 3-4 hours during the day      Comment Scheduled appointment to meet with nutrtitionist on 9/5 at [place] has met with the dietician and is trying to follow her recommendations. She has been eating more fruits and vegetable and is trying to eat several small meals thoughout the day. She has also stopped adding salt to food.       Expected Outcome Short: Meet with nutritionist  Long: Find meal plan that works for her. Short: continue to increase fruit and vegetable intake. Long: follow heart healthy diet as a part of a healthy and active lifestyle        Personal Goal #2 Re-Evaluation   Personal Goal #2  - Has added grilled or baked chicken to diet to increase protein         Personal Goal #3 Re-Evaluation   Personal Goal #3  - cut down on fluid intake during meals but still needs to drink some to get food down.         Personal Goal #4 Re-Evaluation   Personal Goal #4  - Hassan Rowan has been consistant with 64-100 oz range for fluid.          Nutrition Goals Discharge (Final Nutrition Goals Re-Evaluation):     Nutrition Goals Re-Evaluation - 10/05/16 0842      Goals   Current Weight 317 lb (143.8 kg)   Nutrition Goal eat every 3-4 hours during the day   Comment Amedeo Plenty has met with the dietician and is trying to follow her recommendations. She has  been eating more fruits and vegetable and is trying to eat several small meals thoughout the day. She has also stopped adding salt to food.    Expected Outcome Short: continue to increase fruit and vegetable intake. Long: follow heart healthy diet as a part of a healthy and active lifestyle     Personal Goal #2 Re-Evaluation   Personal Goal #2 Has added grilled or baked chicken to diet to increase protein      Personal Goal #3 Re-Evaluation   Personal Goal #3 cut down on fluid intake during meals but still needs to drink some to get food down.      Personal Goal #4 Re-Evaluation   Personal Goal #4 Hassan Rowan has been consistant with 64-100 oz range for fluid.       Psychosocial: Target Goals: Acknowledge presence  or absence of significant depression and/or stress, maximize coping skills, provide positive support system. Participant is able to verbalize types and ability to use techniques and skills needed for reducing stress and depression.   Initial Review & Psychosocial Screening:     Initial Psych Review & Screening - 08/20/16 1409      Initial Review   Comments Lynzie is aso stredd about the fact that her medical team has not been listening to her.  She wants to be heard , evaluated and treated.       Quality of Life Scores:      Quality of Life - 10/23/16 0825      Quality of Life Scores   Health/Function Pre 10.64 %   Health/Function Post 11.5 %   Health/Function % Change 8.08 %   Socioeconomic Pre 11.44 %   Socioeconomic Post 19.64 %   Socioeconomic % Change  71.68 %   Psych/Spiritual Pre 16.29 %   Psych/Spiritual Post 20.57 %   Psych/Spiritual % Change 26.27 %   Family Pre 5.63 %   Family Post 8.5 %   Family % Change 50.98 %   GLOBAL Pre 10.64 %   GLOBAL Post 14.6 %   GLOBAL % Change 37.22 %      PHQ-9: Recent Review Flowsheet Data    Depression screen [facility] 2/9 10/23/2016 09/08/2016 09/07/2016 08/20/2016 07/16/2016   Decreased Interest 0 0 0 0 0   Down, Depressed,  Hopeless 0 0 0 0 0   PHQ - 2 Score 0 0 0 0 0   Altered sleeping 1 - 3 3 -   Tired, decreased energy 2 - 0 2 -   Change in appetite 2 - 3 3 -   Feeling bad or failure about yourself  0 - 0 0 -   Trouble concentrating 0 - 0 0 -   Moving slowly or fidgety/restless 0 - 0 0 -   Suicidal thoughts 0 - 0 0 -   PHQ-9 Score 5 - 6 8 -   Difficult doing work/chores Somewhat difficult - Somewhat difficult Very difficult -     Interpretation of Total Score  Total Score Depression Severity:  1-4 = Minimal depression, 5-9 = Mild depression, 10-14 = Moderate depression, 15-19 = Moderately severe depression, 20-27 = Severe depression   Psychosocial Evaluation and Intervention:     Psychosocial Evaluation - 10/21/16 0905      Discharge Psychosocial Assessment & Intervention   Comments Counselor met with Evelyn today for his discharge summary.  She has benefited greatly from this program with progress in weight loss; feeling stronger; improved mood and with the recent CPAP actually had a good night's sleep for the first time in a long time last night with possibly 5 hours of sleep.  She was typically getting 2 or 3 hours/night.  Yilia had some heart issues on Monday and was taken to the ED that determined she was not having a heart attack.  They changed her meds and she has noticed less fluid (with a loss of 5 pounds in 2 days) and she is feeling a little less tired.  Brynn continues to have fluid in her body but hopes the changed meds will continue to address this.  She also spoke with her Dr. about the chronic yeast infections and was informed one of her medications could be causing this; and gave her suggestions on how to possibly prevent this in the future.  Galaxy has grown in strength and  stamina while in this program and has lost some weight in the process which has all been progress towards her goals.  Grant plans to attend the [employer] program at [facility] following completion of this program.         Psychosocial Re-Evaluation:     Psychosocial Re-Evaluation    Row Name 08/31/16 1154 09/07/16 0853 09/30/16 1542 10/05/16 0857 10/12/16 0959     Psychosocial Re-Evaluation   Current issues with  - Current Sleep Concerns;Current Stress Concerns Current Stress Concerns Current Stress Concerns;Current Sleep Concerns  -   Comments Marillyn stated she if frustated since the doctors don't seem to listen to her and she feels she gets conflicting information.  Sable is feeling great mentally.  Definitely making improvements!!   She has quit smoking!!! And was congratulated for her continued abstienance.  Her PHQ-9 score is down two points today from when she started. We are heading in the right direction.  Her stressors are her lack of sleep and lack of appeptite.  We scheduled her to meet with nutrtiton to help with diet.  We talked about natural remedies for sleep using melatonin and/or sleepytime teas to help her sleep better.   If these don't help it may be worth looking into a sleep study.  She says that although she is feeling better her support system still thinks she needs to keep improving. She continues to have pain is her back and ankle.  She was not able to stay on the treadmill the whole time, but did do extra time on treadmill.  She does not want to take extra medication to manage her pain so we discussed alternatives for pain management including chiropractor or acupuncture.   Amedeo Plenty does not sleep will but is in communication with her doctor about a C-pap. Her stress is improving and she stated she is learning to better control her anger. She uses prayer to help her with this. Her main frustration is lack of communication between her doctors.   Jadin reports feeling better overall with her mood and ability to cope with stressors in her life.  She is also losing weight and feeling empowered by this.  She continues to only sleep 2-3 hours per night but should begin using a CPAP this Thursday  per Doctors orders.  She is hoping this will help.  Kylynn is consistently coming to class and working out.  Counselor commended her on her progress made and commitment to improving her health.     Expected Outcomes  - Short: Try the sleepy time tea and melatonin to sleep better.  Long: Continue to have postive mood and keep moving forward. Short: Look in alternate pain management therapies.  Long: Improve pain management to improve overall quality of life.  Short: Look into alternative pain management. Communicate with doctor about C-pap. Long: improved sleep and pain management will improve quality of life.   -   Interventions  -  - Encouraged to attend Cardiac Rehabilitation for the exercise;Stress management education Encouraged to attend Cardiac Rehabilitation for the exercise;Stress management education;Relaxation education  -   Continue Psychosocial Services  Follow up required by staff  - Follow up required by staff Follow up required by staff  -      Psychosocial Discharge (Final Psychosocial Re-Evaluation):     Psychosocial Re-Evaluation - 10/12/16 0959      Psychosocial Re-Evaluation   Comments Clora reports feeling better overall with her mood and ability to cope with stressors in her  life.  She is also losing weight and feeling empowered by this.  She continues to only sleep 2-3 hours per night but should begin using a CPAP this Thursday per Doctors orders.  She is hoping this will help.  Houa is consistently coming to class and working out.  Counselor commended her on her progress made and commitment to improving her health.        Vocational Rehabilitation: Provide vocational rehab assistance to qualifying candidates.   Vocational Rehab Evaluation & Intervention:     Vocational Rehab - 08/20/16 1358      Initial Vocational Rehab Evaluation & Intervention   Assessment shows need for Vocational Rehabilitation No      Education: Education Goals: Education classes will  be provided on a variety of topics geared toward better understanding of heart health and risk factor modification. Participant will state understanding/return demonstration of topics presented as noted by education test scores.  Learning Barriers/Preferences:     Learning Barriers/Preferences - 08/20/16 1357      Learning Barriers/Preferences   Learning Barriers Sight  Left eye vision at times is blurred   Learning Preferences None      Education Topics: General Nutrition Guidelines/Fats and Fiber: -Group instruction provided by verbal, written material, models and posters to present the general guidelines for heart healthy nutrition. Gives an explanation and review of dietary fats and fiber.   Cardiac Rehab from 10/26/2016 in [facility] Cardiac and Pulmonary Rehab  Date  10/26/16  Educator  CR  Instruction Review Code  1- Verbalizes Understanding      Controlling Sodium/Reading Food Labels: -Group verbal and written material supporting the discussion of sodium use in heart healthy nutrition. Review and explanation with models, verbal and written materials for utilization of the food label.   Cardiac Rehab from 10/26/2016 in [facility] Cardiac and Pulmonary Rehab  Date  09/07/16  Educator  CR  Instruction Review Code  1- Verbalizes Understanding      Exercise Physiology & Risk Factors: - Group verbal and written instruction with models to review the exercise physiology of the cardiovascular system and associated critical values. Details cardiovascular disease risk factors and the goals associated with each risk factor.   Cardiac Rehab from 10/26/2016 in [facility] Cardiac and Pulmonary Rehab  Date  09/16/16  Educator  [facility]  Instruction Review Code  1- Verbalizes Understanding      Aerobic Exercise & Resistance Training: - Gives group verbal and written discussion on the health impact of inactivity. On the components of aerobic and resistive training programs and the benefits of this training  and how to safely progress through these programs.   Cardiac Rehab from 10/26/2016 in [facility] Cardiac and Pulmonary Rehab  Date  09/21/16  Educator  [facility]  Instruction Review Code  1- Verbalizes Understanding      Flexibility, Balance, General Exercise Guidelines: - Provides group verbal and written instruction on the benefits of flexibility and balance training programs. Provides general exercise guidelines with specific guidelines to those with heart or lung disease. Demonstration and skill practice provided.   Cardiac Rehab from 10/26/2016 in [facility] Cardiac and Pulmonary Rehab  Date  09/23/16  Educator  [facility]  Instruction Review Code  1- Verbalizes Understanding      Stress Management: - Provides group verbal and written instruction about the health risks of elevated stress, cause of high stress, and healthy ways to reduce stress.   Cardiac Rehab from 10/26/2016 in [facility] Cardiac and Pulmonary Rehab  Date  09/30/16  Educator  [place]  Instruction Review Code  1- Verbalizes Understanding      Depression: - Provides group verbal and written instruction on the correlation between heart/lung disease and depressed mood, treatment options, and the stigmas associated with seeking treatment.   Cardiac Rehab from 10/26/2016 in [facility] Cardiac and Pulmonary Rehab  Date  09/02/16  Educator  [facility]  Instruction Review Code (retired)  2- meets goals/outcomes  Instruction Review Code  1- Actuary & Physiology of the Heart: - Group verbal and written instruction and models provide basic cardiac anatomy and physiology, with the coronary electrical and arterial systems. Review of: AMI, Angina, Valve disease, Heart Failure, Cardiac Arrhythmia, Pacemakers, and the ICD.   Cardiac Procedures: - Group verbal and written instruction to review commonly prescribed medications for heart disease. Reviews the medication, class of the drug, and side effects. Includes the steps to properly store  meds and maintain the prescription regimen. (beta blockers and nitrates)   Cardiac Rehab from 10/26/2016 in [facility] Cardiac and Pulmonary Rehab  Date  10/05/16  Educator  CE  Instruction Review Code  2- Demonstrated Understanding      Cardiac Medications I: - Group verbal and written instruction to review commonly prescribed medications for heart disease. Reviews the medication, class of the drug, and side effects. Includes the steps to properly store meds and maintain the prescription regimen.   Cardiac Rehab from 10/26/2016 in [facility] Cardiac and Pulmonary Rehab  Date  10/12/16  Educator  [facility]  Instruction Review Code  1- Verbalizes Understanding      Cardiac Medications II: -Group verbal and written instruction to review commonly prescribed medications for heart disease. Reviews the medication, class of the drug, and side effects. (all other drug classes)    Go Sex-Intimacy & Heart Disease, Get SMART - Goal Setting: - Group verbal and written instruction through game format to discuss heart disease and the return to sexual intimacy. Provides group verbal and written material to discuss and apply goal setting through the application of the S.M.A.R.T. Method.   Cardiac Rehab from 10/26/2016 in [facility] Cardiac and Pulmonary Rehab  Date  10/05/16  Educator  CE  Instruction Review Code  2- Demonstrated Understanding      Other Matters of the Heart: - Provides group verbal, written materials and models to describe Heart Failure, Angina, Valve Disease, Peripheral Artery Disease, and Diabetes in the realm of heart disease. Includes description of the disease process and treatment options available to the cardiac patient.   Exercise & Equipment Safety: - Individual verbal instruction and demonstration of equipment use and safety with use of the equipment.   Cardiac Rehab from 10/26/2016 in [facility] Cardiac and Pulmonary Rehab  Date  08/20/16  Educator  [facility]  Instruction Review Code  1- Verbalizes  Understanding      Infection Prevention: - Provides verbal and written material to individual with discussion of infection control including proper hand washing and proper equipment cleaning during exercise session.   Cardiac Rehab from 10/26/2016 in [facility] Cardiac and Pulmonary Rehab  Date  08/20/16  Educator  [facility]  Instruction Review Code  1- Verbalizes Understanding      Falls Prevention: - Provides verbal and written material to individual with discussion of falls prevention and safety.   Cardiac Rehab from 10/26/2016 in [facility] Cardiac and Pulmonary Rehab  Date  08/20/16  Educator  [facility]  Instruction Review Code (retired)  2- meets goals/outcomes  Instruction Review Code  1- Verbalizes Understanding      Diabetes: - Individual verbal and written instruction to review signs/symptoms of diabetes, desired ranges of glucose level fasting, after meals and with exercise. Acknowledge that pre and post exercise glucose checks will be done for 3 sessions at entry of program.   Other: -Provides group and verbal instruction on various topics (see comments)    Knowledge Questionnaire Score:     Knowledge Questionnaire Score - 10/23/16 0825      Knowledge Questionnaire Score   Pre Score 20/28   Post Score 27/28      Core Components/Risk Factors/Patient Goals at Admission:     Personal Goals and Risk Factors at Admission - 08/20/16 1412      Core Components/Risk Factors/Patient Goals on Admission    Weight Management Yes;Obesity;Weight Loss   Admit Weight 317 lb 11.2 oz (144.1 kg)   Goal Weight: Short Term 315 lb (142.9 kg)   Goal Weight: Long Term 250 lb (113.4 kg)   Expected Outcomes Short Term: Continue to assess and modify interventions until short term weight is achieved;Long Term: Adherence to nutrition and physical activity/exercise program aimed toward attainment of established weight goal;Weight Loss: Understanding of general recommendations for a balanced deficit meal  plan, which promotes 1-2 lb weight loss per week and includes a negative energy balance of [phone] kcal/d;Understanding of distribution of calorie intake throughout the day with the consumption of 4-5 meals/snacks;Understanding recommendations for meals to include 15-35% energy as protein, 25-35% energy from fat, 35-60% energy from carbohydrates, less than 276m of dietary cholesterol, 20-35 gm of total fiber daily   Tobacco Cessation Yes   Number of packs per day is smoking about 8 a day   correction SB   Intervention Assist the participant in steps to quit. Provide individualized education and counseling about committing to Tobacco Cessation, relapse prevention, and pharmacological support that can be provided by physician.;OAdvice worker assist with locating and accessing local/national Quit Smoking programs, and support quit date choice.   Expected Outcomes Short Term: Will demonstrate readiness to quit, by selecting a quit date.;Short Term: Will quit all tobacco product use, adhering to prevention of relapse plan.;Long Term: Complete abstinence from all tobacco products for at least 12 months from quit date.   Improve shortness of breath with ADL's Yes   Intervention Provide education, individualized exercise plan and daily activity instruction to help decrease symptoms of SOB with activities of daily living.   Expected Outcomes Short Term: Achieves a reduction of symptoms when performing activities of daily living.   Increase knowledge of respiratory medications and ability to use respiratory devices properly  Yes   Intervention Provide education and demonstration as needed of appropriate use of medications, inhalers, and oxygen therapy.   Expected Outcomes Short Term: Achieves understanding of medications use. Understands that oxygen is a medication prescribed by physician. Demonstrates appropriate use of inhaler and oxygen therapy.   Heart Failure Yes   Intervention Provide a  combined exercise and nutrition program that is supplemented with education, support and counseling about heart failure. Directed toward relieving symptoms such as shortness of breath, decreased exercise tolerance, and extremity edema.   Expected Outcomes Improve functional capacity of life;Short term: Attendance in program 2-3 days a week with increased exercise capacity. Reported lower sodium intake. Reported increased fruit and vegetable intake. Reports medication compliance.;Short term: Daily weights obtained and reported for increase. Utilizing diuretic protocols set by physician.;Long term: Adoption of self-care skills and reduction of barriers for early signs  and symptoms recognition and intervention leading to self-care maintenance.   Hypertension Yes   Intervention Provide education on lifestyle modifcations including regular physical activity/exercise, weight management, moderate sodium restriction and increased consumption of fresh fruit, vegetables, and low fat dairy, alcohol moderation, and smoking cessation.;Monitor prescription use compliance.   Expected Outcomes Short Term: Continued assessment and intervention until BP is < 140/44m HG in hypertensive participants. < 130/872mHG in hypertensive participants with diabetes, heart failure or chronic kidney disease.;Long Term: Maintenance of blood pressure at goal levels.   Lipids Yes   Intervention Provide education and support for participant on nutrition & aerobic/resistive exercise along with prescribed medications to achieve LDL <7058mHDL >4m25m Expected Outcomes Short Term: Participant states understanding of desired cholesterol values and is compliant with medications prescribed. Participant is following exercise prescription and nutrition guidelines.;Long Term: Cholesterol controlled with medications as prescribed, with individualized exercise RX and with personalized nutrition plan. Value goals: LDL < 70mg105mL > 40 mg.      Core  Components/Risk Factors/Patient Goals Review:      Goals and Risk Factor Review    Row Name 09/07/16 0842 32677/[id number] 1025 10/05/16 0837         Core Components/Risk Factors/Patient Goals Review   Personal Goals Review Tobacco Cessation;Weight Management/Obesity;Improve shortness of breath with ADL's;Increase knowledge of respiratory medications and ability to use respiratory devices properly.;Develop more efficient breathing techniques such as purse lipped breathing and diaphragmatic breathing and practicing self-pacing with activity.;Hypertension;Heart Failure;Lipids  - Weight Management/Obesity;Heart Failure;Tobacco Cessation;Hypertension;Improve shortness of breath with ADL's     Review BarbaFranceskabeen doing well in rehab.  Her weight has stayed fairly steady.  She has been having some swelling recently but has not let her doctor know. When she did talk to her doctor about her symptoms they told her she was drinking too much water.   We talked about the importance of staying on top of her heart failure symptoms.   Her breathing is actually getting better and she is using her PLB.  Her blood pressures have been pretty good.  She is doing well on her meds.   BarbaShamiahrted that she spoke to her MD about her feet swelling on 8/20 and she said they just said to make sure to take her Furosemide daily. BarbaCorinthianrted that she took her maxide this weekend which is prn since her ankles were more swollen.  Weight has been maintained as long as she is not retaining fluid. She has not been eating salt and has decreased her fluid intake. She has been working with her doctors on medications to help control fluid. Lab work has been good and blood pressures have been in acceptable ranges. Dr. is going to give her C-Pap at night. She has been in communication with doctors about heart faliure symptoms. BarbrAmedeo Plentystill been smoke free!     Expected Outcomes Short: Talk to doctor about heart failure symptoms.  Long:  Continue to work on weight loss.   - Short: follow though on the c-pap machine for night time use. Long: continue to work on weight loss.         Core Components/Risk Factors/Patient Goals at Discharge (Final Review):      Goals and Risk Factor Review - 10/05/16 0837      Core Components/Risk Factors/Patient Goals Review   Personal Goals Review Weight Management/Obesity;Heart Failure;Tobacco Cessation;Hypertension;Improve shortness of breath with ADL's   Review Weight has been maintained as long as she  is not retaining fluid. She has not been eating salt and has decreased her fluid intake. She has been working with her doctors on medications to help control fluid. Lab work has been good and blood pressures have been in acceptable ranges. Dr. is going to give her C-Pap at night. She has been in communication with doctors about heart faliure symptoms. Amedeo Plenty has still been smoke free!   Expected Outcomes Short: follow though on the c-pap machine for night time use. Long: continue to work on weight loss.       ITP Comments:     ITP Comments    Row Name 08/20/16 1337 08/20/16 1338 08/24/16 0915 08/26/16 0609 08/31/16 0830   ITP Comments Medical review completed today. INitial ITP created for review and changes if required and signature by Dr Sabra Heck.  Diagnosis documentation can be found in [facility] Medical review completed today. INitial ITP created for review and changes if required and signature by Dr Sabra Heck.  Diagnosis documentation can be found in [facility] encounter 08/06/2016 [name] did not complete her rehab session.  Phiona came to class today but did not exercise. She stated that she had a cath last week and a blood clot was found in her groin area. She was put on new meds and was supposed to see Dr. Clayborn Bigness today. She was advised by staff to ask Dr. Clayborn Bigness if he advised her to come back to cardiac rehab based on this new diagnosis. She did not exercise and went straight to Dr. Etta Quill  office to speak with him. She was asked to bring back medical clearance prior to coming back to exercise.  30 day review. Continue with ITP unless directed changes per Medical Director review   New to program At the end of [place] told me that she has ankle swelling, Aylani had a lot of PVC's even at rest during the 79mnute Nutrition education session which were faxed to Dr. CClayborn Bigness I suggested she stop by/or call  Dr. CMarianna Payment office with a copy of the PVC and tell them fher fee  I also gave BPamala Hurryprinted info about Heart failure and when to call the MD and went over it with her. BKhileystated she understood and wanted to learn about her heart. Given the Cardiac Rehab education booklet also.    RBatesvilleName 08/31/16 1148 08/31/16 1205 09/07/16 1022 09/16/16 0857 09/18/16 0810   ITP Comments BPamala Hurryhad "chest tightness" which started when she was walking at 1.526m TM. BaJoelleported that she recently had a heart cath with no blockage and that is to see the breathing doctor this week. BaTateeported her "chest pressure: decreased wtih rest and eventually went away. BaMargreteesumed on the treadmill by herself.  [facility]  I took a copy of the multiple PVC's including [place] to Dr. CaEtta Quillffice. [facility] staff asked to give it to Dr. CaEtta Quillurse who was in a room with a patient. I later checked CARE everywhere and last potassium 2 weeks ago. Did not see a Magnesium or Phosphorus level. No c/o but [facility]'s [facility].  Ba[place]ot tangled up with the dogs and fell in a hole which hurt her back and left leg. She was able to tolerate exercise today. [place] check her blood pressure on both arms today.  She was lower on the right side by 6 mmHg systolic and 10 mmHg on the diastolic.  We  encouraged her to talk to her doctor about this.   [place] Name 09/23/16 1102 10/19/16 0926 10/19/16 1252 10/20/16 1214 10/21/16 0633    ITP Comments 30 day review. Continue with ITP unless directed changes per Medical Director review.   Will take to the [facility] EMergency dept since 3 beat runs of vtach with multifocal PVC c/o 3/10 chest pain when I asked her.  Per [facility] EPIC/CHL notes Jozey will be assessed by Cardiology and admitted to hospital /telemetry.  Discharged from hospital per [facility] notes with follow up appts-Follow-Ups with Cardiology and her primary care MD. 30 day Review. Continue with ITP unless directed changes per Medical Director Review.       Comments: Discharge ITP

## 2016-11-04 ENCOUNTER — Ambulatory Visit: Payer: Medicare Other | Attending: Anesthesiology | Admitting: Anesthesiology

## 2016-11-04 ENCOUNTER — Encounter: Payer: Self-pay | Admitting: Anesthesiology

## 2016-11-04 VITALS — BP 102/71 | HR 68 | Temp 97.9°F | Resp 22 | Ht 66.0 in | Wt 313.0 lb

## 2016-11-04 DIAGNOSIS — Z7982 Long term (current) use of aspirin: Secondary | ICD-10-CM | POA: Insufficient documentation

## 2016-11-04 DIAGNOSIS — M48062 Spinal stenosis, lumbar region with neurogenic claudication: Secondary | ICD-10-CM | POA: Diagnosis not present

## 2016-11-04 DIAGNOSIS — Z79891 Long term (current) use of opiate analgesic: Secondary | ICD-10-CM | POA: Diagnosis not present

## 2016-11-04 DIAGNOSIS — M546 Pain in thoracic spine: Secondary | ICD-10-CM | POA: Diagnosis present

## 2016-11-04 DIAGNOSIS — M25551 Pain in right hip: Secondary | ICD-10-CM

## 2016-11-04 DIAGNOSIS — M5387 Other specified dorsopathies, lumbosacral region: Secondary | ICD-10-CM | POA: Diagnosis not present

## 2016-11-04 DIAGNOSIS — M19011 Primary osteoarthritis, right shoulder: Secondary | ICD-10-CM | POA: Diagnosis not present

## 2016-11-04 DIAGNOSIS — M533 Sacrococcygeal disorders, not elsewhere classified: Secondary | ICD-10-CM | POA: Insufficient documentation

## 2016-11-04 DIAGNOSIS — Z9049 Acquired absence of other specified parts of digestive tract: Secondary | ICD-10-CM | POA: Diagnosis not present

## 2016-11-04 DIAGNOSIS — G894 Chronic pain syndrome: Secondary | ICD-10-CM

## 2016-11-04 DIAGNOSIS — I517 Cardiomegaly: Secondary | ICD-10-CM | POA: Insufficient documentation

## 2016-11-04 DIAGNOSIS — M1712 Unilateral primary osteoarthritis, left knee: Secondary | ICD-10-CM | POA: Diagnosis not present

## 2016-11-04 DIAGNOSIS — M5431 Sciatica, right side: Secondary | ICD-10-CM | POA: Diagnosis not present

## 2016-11-04 DIAGNOSIS — G8929 Other chronic pain: Secondary | ICD-10-CM

## 2016-11-04 DIAGNOSIS — M5136 Other intervertebral disc degeneration, lumbar region: Secondary | ICD-10-CM

## 2016-11-04 DIAGNOSIS — F1190 Opioid use, unspecified, uncomplicated: Secondary | ICD-10-CM

## 2016-11-04 MED ORDER — OXYCODONE-ACETAMINOPHEN 7.5-325 MG PO TABS: 1.0000 | ORAL_TABLET | Freq: Four times a day (QID) | ORAL | 0 refills | Status: DC | PRN

## 2016-11-04 NOTE — Progress Notes (Signed)
Safety precautions to be maintained throughout the outpatient stay will include: orient to surroundings, keep bed in low position, maintain call bell within reach at all times, provide assistance with transfer out of bed and ambulation.  

## 2016-11-04 NOTE — Progress Notes (Signed)
Nursing Pain Medication Assessment:  Safety precautions to be maintained throughout the outpatient stay will include: orient to surroundings, keep bed in low position, maintain call bell within reach at all times, provide assistance with transfer out of bed and ambulation.  Medication Inspection Compliance: Pill count conducted under aseptic conditions, in front of the patient. Neither the pills nor the bottle was removed from the patient's sight at any time. Once count was completed pills were immediately returned to the patient in their original bottle.  Medication: Oxycodone IR Pill/Patch Count: 41 of 120 pills remain Pill/Patch Appearance: Markings consistent with prescribed medication Bottle Appearance: Standard pharmacy container. Clearly labeled. Filled Date: 10 / 09 / 2018 Last Medication intake:  Today

## 2016-11-04 NOTE — Progress Notes (Signed)
Subjective:  Patient ID: Wendy Sanchez, female    DOB: [birth year]  Age: 66 y.o. MRN: [id number]  CC: Back Pain (mid)   Procedure: None   HPI [name] presents for return evaluation. She was last seen a few months ago. She continues to have diffuse body pain primarily of an osteoarthritic nature. It affects her shoulder hips knees and low back. The pain has been recalcitrant and unfortunately required consistent management with opioid medications. She's been on oxycodone medication for a considerable period of time but tolerates this well and he keeps her pain under good control. We have reviewed her narcotic assessment sheet today and she continues to derive good lifestyle functional improvement with the medications. She has failed conservative therapy. Otherwise she is in her usual state of health at this time. No change in lower extremity strength or function or bowel bladder function is noted this time.  Outpatient Medications Prior to Visit  Medication Sig Dispense Refill  . albuterol (PROVENTIL HFA;VENTOLIN HFA) 108 (90 Base) MCG/ACT inhaler Inhale 2 puffs into the lungs every 6 (six) hours as needed for wheezing or shortness of breath.    Marland Kitchen aspirin EC 325 MG EC tablet Take 1 tablet (325 mg total) by mouth daily. 30 tablet 0  . carvedilol (COREG) 3.125 MG tablet Take 3.125 mg by mouth 2 (two) times daily with a meal.     . clotrimazole (LOTRIMIN) 1 % cream Apply 1 application topically 2 (two) times daily as needed (rash).     . cyclobenzaprine (FLEXERIL) 10 MG tablet Take 1 tablet (10 mg total) by mouth 2 (two) times daily. for muscle spams (Patient taking differently: Take 10 mg by mouth 3 (three) times daily. for muscle spams) 60 tablet 3  . dexlansoprazole (DEXILANT) 60 MG capsule Take 60 mg by mouth daily.    . diclofenac sodium (VOLTAREN) 1 % GEL Apply 2 g topically 4 (four) times daily as needed (pain).     Marland Kitchen docusate sodium (COLACE) 100 MG capsule Take 200 mg by mouth at  bedtime.     . fluticasone (FLONASE) 50 MCG/ACT nasal spray Place 1 spray into both nostrils daily.    . Fluticasone-Salmeterol (ADVAIR) 250-50 MCG/DOSE AEPB Inhale 1 puff into the lungs 2 (two) times daily.    . folic acid (FOLVITE) 1 MG tablet Take 1 mg by mouth at bedtime.     . furosemide (LASIX) 40 MG tablet Take 40 mg by mouth daily.     Marland Kitchen gabapentin (NEURONTIN) 300 MG capsule Take 2 capsules (600 mg total) by mouth 3 (three) times daily. (Patient taking differently: Take 600 mg by mouth 2 (two) times daily. May take an additional 600mg  as needed for pain) 180 capsule 1  . ibuprofen (ADVIL,MOTRIN) 800 MG tablet 800 mg every 8 (eight) hours as needed.     Marland Kitchen levocetirizine (XYZAL) 5 MG tablet Take 5 mg by mouth at bedtime.     . mirabegron ER (MYRBETRIQ) 50 MG TB24 tablet Take 50 mg by mouth daily.    . montelukast (SINGULAIR) 10 MG tablet Take 10 mg by mouth at bedtime.    . Multiple Vitamins-Minerals (ICAPS AREDS 2) CAPS Take 1 capsule by mouth 2 (two) times daily.     . Multiple Vitamins-Minerals (MULTIVITAMIN GUMMIES ADULTS) CHEW Chew 2 tablets by mouth daily.     . nortriptyline (PAMELOR) 50 MG capsule Take 50 mg by mouth at bedtime.    . Omega-3 Fatty Acids (FISH OIL) 1000  MG CAPS Take 1000mg  every 3rd day    . potassium chloride SA (K-DUR,KLOR-CON) 20 MEQ tablet Take 20 mEq by mouth every other day. Take 20 meq every third day    . Probiotic Product (PROBIOTIC COLON SUPPORT) CAPS Take 1 capsule by mouth at bedtime.    . ramipril (ALTACE) 10 MG capsule Take 10 mg by mouth daily.     . sucralfate (CARAFATE) 1 G tablet Take 1 g by mouth 2 (two) times daily as needed (esophageal burning).     Marland Kitchen tiotropium (SPIRIVA) 18 MCG inhalation capsule Place into inhaler and inhale.    . Vitamin D, Ergocalciferol, (DRISDOL) 50000 UNITS CAPS capsule Take 50,000 Units by mouth every 7 (seven) days. thursdays    . oxyCODONE-acetaminophen (PERCOCET) 7.5-325 MG tablet Take 1 tablet by mouth every 6 (six)  hours as needed for severe pain. 120 tablet 0   No facility-administered medications prior to visit.     Review of Systems CNS: No sedation or confusion GI: No constipation or abdominal pain Cardiac: No angina or palpitations  Objective:  BP 102/71 (BP Location: Left Arm, Patient Position: Sitting, Cuff Size: Large)   Pulse 68   Temp 97.9 F (36.6 C) (Oral)   Resp (!) 22   Ht 5\' 6"  (1.676 m)   Wt (!) 313 lb (142 kg)   SpO2 100%   BMI 50.52 kg/m    BP Readings from Last 3 Encounters:  11/04/16 102/71  10/20/16 116/69  10/19/16 120/70     Wt Readings from Last 3 Encounters:  11/04/16 (!) 313 lb (142 kg)  10/19/16 (!) 315 lb (142.9 kg)  10/19/16 (!) 315 lb (142.9 kg)     Physical Exam Pt is alert and oriented PERRL EOMI HEART IS RRR no murmur or rub LCTA no wheezing or rhales MUSCULOSKELETAL reveals some bilateral paraspinous muscle tenderness in the lumbar region. She does have a trigger point in the right splenius capitis muscle and the cervical region. Range of motion at the glenohumeral joints and hips is good with associated pain. Her muscle tone and bulk is good. No change in strength is noted to the lower extremities.  Labs  No results found for: HGBA1C Lab Results  Component Value Date   LDLCALC (H) 03/16/2007    150        Total Cholesterol/HDL:CHD Risk Coronary Heart Disease Risk Table                     Men   Women  1/2 Average Risk   3.4   3.3   CREATININE 1.15 (H) 10/19/2016    -------------------------------------------------------------------------------------------------------------------- Lab Results  Component Value Date   WBC 4.0 10/19/2016   HGB 13.0 10/19/2016   HCT 39.9 10/19/2016   PLT 262 10/19/2016   GLUCOSE 103 (H) 10/19/2016   CHOL (H) 03/16/2007    236        ATP III CLASSIFICATION:  <200     mg/dL   Desirable  200-239  mg/dL   Borderline High  >=240    mg/dL   High   TRIG 76 03/16/2007   HDL 71 03/16/2007   LDLCALC  (H) 03/16/2007    150        Total Cholesterol/HDL:CHD Risk Coronary Heart Disease Risk Table                     Men   Women  1/2 Average Risk   3.4   3.3  NA 139 10/19/2016   K 4.8 10/19/2016   CL 104 10/19/2016   CREATININE 1.15 (H) 10/19/2016   BUN 18 10/19/2016   CO2 27 10/19/2016   INR 1.11 07/26/2015    --------------------------------------------------------------------------------------------------------------------- Dg Chest 2 View  Result Date: 10/19/2016 CLINICAL DATA:  Chest pain EXAM: CHEST  2 VIEW COMPARISON:  01/24/2016 FINDINGS: Chronic cardiomegaly. Stable mediastinal contours. There is no edema, consolidation, effusion, or pneumothorax. Cholecystectomy clips. There is a contour abnormality along the posterior right diaphragm correlating with fatty Bochdalek's hernia on interval chest CT. IMPRESSION: 1. No acute finding. 2. Cardiomegaly. Electronically Signed   By: Monte Fantasia M.D.   On: 10/19/2016 10:28     Assessment & Plan:   Jema was seen today for back pain.  Diagnoses and all orders for this visit:  Chronic pain syndrome  Chronic, continuous use of opioids  Primary osteoarthritis of right shoulder  Chronic pain of right hip  Spinal stenosis of lumbar region with neurogenic claudication  Sciatica of right side associated with disorder of lumbosacral spine  DDD (degenerative disc disease), lumbar  Primary osteoarthritis of left knee  Other orders -     Discontinue: oxyCODONE-acetaminophen (PERCOCET) 7.5-325 MG tablet; Take 1 tablet by mouth every 6 (six) hours as needed for severe pain. -     oxyCODONE-acetaminophen (PERCOCET) 7.5-325 MG tablet; Take 1 tablet by mouth every 6 (six) hours as needed for severe pain.        ----------------------------------------------------------------------------------------------------------------------  Problem List Items Addressed This Visit      Unprioritized   Chronic pain syndrome - Primary    Osteoarthritis of left knee   Relevant Medications   oxyCODONE-acetaminophen (PERCOCET) 7.5-325 MG tablet   Spinal stenosis of lumbar region    Other Visit Diagnoses    Chronic, continuous use of opioids       Primary osteoarthritis of right shoulder       Relevant Medications   oxyCODONE-acetaminophen (PERCOCET) 7.5-325 MG tablet   Chronic pain of right hip       Relevant Medications   oxyCODONE-acetaminophen (PERCOCET) 7.5-325 MG tablet   Sciatica of right side associated with disorder of lumbosacral spine       DDD (degenerative disc disease), lumbar       Relevant Medications   oxyCODONE-acetaminophen (PERCOCET) 7.5-325 MG tablet        ----------------------------------------------------------------------------------------------------------------------  1. Chronic pain syndrome We'll continue with her current medication management with refills given for the next 2 months  2. Chronic, continuous use of opioids We'll refill her medications for November 5 and December 5 and we have reviewed the [facility] practitioner database information and it is appropriate.  3. Primary osteoarthritis of right shoulder As above  4. Chronic pain of right hip As above  5. Spinal stenosis of lumbar region with neurogenic claudication   6. Sciatica of right side associated with disorder of lumbosacral spine   7. DDD (degenerative disc disease), lumbar   8. Primary osteoarthritis of left knee     ----------------------------------------------------------------------------------------------------------------------  I am having Ms. Seide maintain her dexlansoprazole, sucralfate, ramipril, montelukast, Fluticasone-Salmeterol, furosemide, diclofenac sodium, folic acid, mirabegron ER, potassium chloride SA, Vitamin D (Ergocalciferol), clotrimazole, PROBIOTIC COLON SUPPORT, MULTIVITAMIN GUMMIES ADULTS, nortriptyline, levocetirizine, ICAPS AREDS 2, docusate sodium, gabapentin,  cyclobenzaprine, albuterol, fluticasone, Fish Oil, ibuprofen, carvedilol, tiotropium, aspirin, and oxyCODONE-acetaminophen.   Meds ordered this encounter  Medications  . DISCONTD: oxyCODONE-acetaminophen (PERCOCET) 7.5-325 MG tablet    Sig: Take 1 tablet by mouth every 6 (six)  hours as needed for severe pain.    Dispense:  120 tablet    Refill:  0    Do not fill until [id number]  . oxyCODONE-acetaminophen (PERCOCET) 7.5-325 MG tablet    Sig: Take 1 tablet by mouth every 6 (six) hours as needed for severe pain.    Dispense:  120 tablet    Refill:  0    Do not fill until [id number]   Patient's Medications  New Prescriptions   No medications on file  Previous Medications   ALBUTEROL (PROVENTIL HFA;VENTOLIN HFA) 108 (90 BASE) MCG/ACT INHALER    Inhale 2 puffs into the lungs every 6 (six) hours as needed for wheezing or shortness of breath.   ASPIRIN EC 325 MG EC TABLET    Take 1 tablet (325 mg total) by mouth daily.   CARVEDILOL (COREG) 3.125 MG TABLET    Take 3.125 mg by mouth 2 (two) times daily with a meal.    CLOTRIMAZOLE (LOTRIMIN) 1 % CREAM    Apply 1 application topically 2 (two) times daily as needed (rash).    CYCLOBENZAPRINE (FLEXERIL) 10 MG TABLET    Take 1 tablet (10 mg total) by mouth 2 (two) times daily. for muscle spams   DEXLANSOPRAZOLE (DEXILANT) 60 MG CAPSULE    Take 60 mg by mouth daily.   DICLOFENAC SODIUM (VOLTAREN) 1 % GEL    Apply 2 g topically 4 (four) times daily as needed (pain).    DOCUSATE SODIUM (COLACE) 100 MG CAPSULE    Take 200 mg by mouth at bedtime.    FLUTICASONE (FLONASE) 50 MCG/ACT NASAL SPRAY    Place 1 spray into both nostrils daily.   FLUTICASONE-SALMETEROL (ADVAIR) 250-50 MCG/DOSE AEPB    Inhale 1 puff into the lungs 2 (two) times daily.   FOLIC ACID (FOLVITE) 1 MG TABLET    Take 1 mg by mouth at bedtime.    FUROSEMIDE (LASIX) 40 MG TABLET    Take 40 mg by mouth daily.    GABAPENTIN (NEURONTIN) 300 MG CAPSULE    Take 2 capsules (600 mg total) by mouth  3 (three) times daily.   IBUPROFEN (ADVIL,MOTRIN) 800 MG TABLET    800 mg every 8 (eight) hours as needed.    LEVOCETIRIZINE (XYZAL) 5 MG TABLET    Take 5 mg by mouth at bedtime.    MIRABEGRON ER (MYRBETRIQ) 50 MG TB24 TABLET    Take 50 mg by mouth daily.   MONTELUKAST (SINGULAIR) 10 MG TABLET    Take 10 mg by mouth at bedtime.   MULTIPLE VITAMINS-MINERALS (ICAPS AREDS 2) CAPS    Take 1 capsule by mouth 2 (two) times daily.    MULTIPLE VITAMINS-MINERALS (MULTIVITAMIN GUMMIES ADULTS) CHEW    Chew 2 tablets by mouth daily.    NORTRIPTYLINE (PAMELOR) 50 MG CAPSULE    Take 50 mg by mouth at bedtime.   OMEGA-3 FATTY ACIDS (FISH OIL) 1000 MG CAPS    Take 1000mg  every 3rd day   POTASSIUM CHLORIDE SA (K-DUR,KLOR-CON) 20 MEQ TABLET    Take 20 mEq by mouth every other day. Take 20 meq every third day   PROBIOTIC PRODUCT (PROBIOTIC COLON SUPPORT) CAPS    Take 1 capsule by mouth at bedtime.   RAMIPRIL (ALTACE) 10 MG CAPSULE    Take 10 mg by mouth daily.    SUCRALFATE (CARAFATE) 1 G TABLET    Take 1 g by mouth 2 (two) times daily as needed (esophageal burning).    TIOTROPIUM (  SPIRIVA) 18 MCG INHALATION CAPSULE    Place into inhaler and inhale.   VITAMIN D, ERGOCALCIFEROL, (DRISDOL) 50000 UNITS CAPS CAPSULE    Take 50,000 Units by mouth every 7 (seven) days. thursdays  Modified Medications   Modified Medication Previous Medication   OXYCODONE-ACETAMINOPHEN (PERCOCET) 7.5-325 MG TABLET oxyCODONE-acetaminophen (PERCOCET) 7.5-325 MG tablet      Take 1 tablet by mouth every 6 (six) hours as needed for severe pain.    Take 1 tablet by mouth every 6 (six) hours as needed for severe pain.  Discontinued Medications   No medications on file   ----------------------------------------------------------------------------------------------------------------------  Follow-up: Return in about 2 months (around 01/04/2017) for evaluation, med refill.    Molli Barrows, MD

## 2016-11-04 NOTE — Patient Instructions (Signed)
You have been given 2 scripts for oxycodone/acetaminophen today.

## 2016-11-18 ENCOUNTER — Encounter: Payer: Self-pay | Admitting: Internal Medicine

## 2016-11-18 ENCOUNTER — Ambulatory Visit: Payer: Medicare Other | Admitting: Internal Medicine

## 2016-11-18 VITALS — BP 100/64 | HR 97 | Ht 67.0 in | Wt 309.0 lb

## 2016-11-18 DIAGNOSIS — I5022 Chronic systolic (congestive) heart failure: Secondary | ICD-10-CM | POA: Diagnosis not present

## 2016-11-18 DIAGNOSIS — I493 Ventricular premature depolarization: Secondary | ICD-10-CM

## 2016-11-18 DIAGNOSIS — Z7689 Persons encountering health services in other specified circumstances: Secondary | ICD-10-CM

## 2016-11-18 DIAGNOSIS — I428 Other cardiomyopathies: Secondary | ICD-10-CM | POA: Diagnosis not present

## 2016-11-18 DIAGNOSIS — R002 Palpitations: Secondary | ICD-10-CM

## 2016-11-18 MED ORDER — AMIODARONE HCL 400 MG PO TABS: ORAL_TABLET | ORAL | 0 refills | Status: DC

## 2016-11-18 MED ORDER — ASPIRIN EC 81 MG PO TBEC
81.0000 mg | DELAYED_RELEASE_TABLET | Freq: Every day | ORAL | 3 refills | Status: DC
Start: 2016-11-18 — End: 2020-04-11

## 2016-11-18 MED ORDER — CARVEDILOL 6.25 MG PO TABS: 6.2500 mg | ORAL_TABLET | Freq: Two times a day (BID) | ORAL | 3 refills | Status: DC

## 2016-11-18 MED ORDER — AMIODARONE HCL 200 MG PO TABS: 200.0000 mg | ORAL_TABLET | Freq: Every day | ORAL | 3 refills | Status: DC

## 2016-11-18 NOTE — Progress Notes (Signed)
ELECTROPHYSIOLOGY CONSULT NOTE  Patient ID: Wendy Sanchez, MRN: [id number], DOB/AGE: 04/11/1950 66 y.o. Admit date: (Not on file) Date of Consult: 11/18/2016  Primary Physician: [name], MD Primary Cardiologist: DC     Wendy Sanchez is a 66 y.o. female who is being seen today for the evaluation of Bradycardia at the request of DC.    HPI Wendy Sanchez is a 66 y.o. female  Seen because of a cardiomyopathy in the context of PVCs consideration of an ICD.  She was initially seen by cardiology 5/18 because of atypical chest pain bilateral edema dyspnea on exertion.  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  Cardiac evaluation is as noted below.  DATE TEST    6/18    Echo   EF 30 % LVE/LAE  8/18    Cath   EF 25 % Normal CAs        Date K Mg Hgb   9/18 4.5   12.8         Her catheterization was complicated by a right groin issue including "femoral vein-not inclusive of clot "she was treated with Rivaroxaban CT scan failed to show pulmonary embolism  Catheterization also showed pressure normalization RA equals 19, RV 16, LV 16.  Continues to struggle with shortness of breath.  Less than 100 feet.  Orthopnea pillow mild edema.  She has exertional chest discomfort and tingling around her right breast.    Past Medical History:  Diagnosis Date  . Allergic rhinitis   . Arthritis   . Asthma    problems with fumes and aerosols cause asthma  . Complication of anesthesia    awakens during surgery; has occurred with last 3-4 surgeries   . Environmental allergies    fumes   . Fibromyalgia   . GERD (gastroesophageal reflux disease)   . History of bronchitis   . Hypertension   . Numbness    hands bilat when driving; improves when not preforming task   . Status post gastric banding   . Tinnitus    comes and goes   . Vertigo    6 months ago approx       Surgical History:  Past Surgical History:  Procedure Laterality Date  . ABDOMINAL HYSTERECTOMY  1979   left  ovary remains  . ABDOMINAL HYSTERECTOMY    . CHOLECYSTECTOMY    . fibrous tissue removed from right shoulder and back of neck      2 years ago   . GANGLION CYST EXCISION    . GASTRIC BYPASS  1980   states had bypass with banding and band left in -  . KNEE SURGERY Bilateral    both knees  2001  . TONSILLECTOMY     age 65     Home Meds: Prior to Admission medications   Medication Sig Start Date End Date Taking? Authorizing Provider  albuterol (PROVENTIL HFA;VENTOLIN HFA) 108 (90 Base) MCG/ACT inhaler Inhale 2 puffs into the lungs every 6 (six) hours as needed for wheezing or shortness of breath.   Yes [provider]  aspirin EC 325 MG EC tablet Take 1 tablet (325 mg total) by mouth daily. 10/21/16  Yes Dustin Flock, MD  carvedilol (COREG) 3.125 MG tablet Take 3.125 mg by mouth 2 (two) times daily with a meal.  09/29/16 09/29/17 Yes [provider]  clotrimazole (LOTRIMIN) 1 % cream Apply 1 application topically 2 (two) times daily as needed (rash).  Yes [provider]  cyclobenzaprine (FLEXERIL) 10 MG tablet Take 1 tablet (10 mg total) by mouth 2 (two) times daily. for muscle spams Patient taking differently: Take 10 mg by mouth 3 (three) times daily. for muscle spams 07/16/16  Yes Molli Barrows, MD  dexlansoprazole (DEXILANT) 60 MG capsule Take 60 mg by mouth daily.   Yes [provider]  diclofenac sodium (VOLTAREN) 1 % GEL Apply 2 g topically 4 (four) times daily as needed (pain).    Yes [provider]  docusate sodium (COLACE) 100 MG capsule Take 200 mg by mouth at bedtime.    Yes [provider]  folic acid (FOLVITE) 1 MG tablet Take 1 mg by mouth at bedtime.    Yes [provider]  furosemide (LASIX) 40 MG tablet Take 40 mg by mouth daily.    Yes [provider]  gabapentin (NEURONTIN) 300 MG capsule Take 2 capsules (600 mg total) by mouth 3 (three) times daily. Patient taking differently: Take 600 mg by  mouth 2 (two) times daily. May take an additional 600mg  as needed for pain 07/16/16  Yes Molli Barrows, MD  ibuprofen (ADVIL,MOTRIN) 800 MG tablet 800 mg every 8 (eight) hours as needed.  08/06/16  Yes [provider]  levocetirizine (XYZAL) 5 MG tablet Take 5 mg by mouth at bedtime.    Yes [provider]  mirabegron ER (MYRBETRIQ) 50 MG TB24 tablet Take 50 mg by mouth daily.   Yes [provider]  montelukast (SINGULAIR) 10 MG tablet Take 10 mg by mouth at bedtime.   Yes [provider]  Multiple Vitamins-Minerals (ICAPS AREDS 2) CAPS Take 1 capsule by mouth 2 (two) times daily.    Yes [provider]  Multiple Vitamins-Minerals (MULTIVITAMIN GUMMIES ADULTS) CHEW Chew 2 tablets by mouth daily.    Yes [provider]  nortriptyline (PAMELOR) 50 MG capsule Take 50 mg by mouth at bedtime.   Yes [provider]  Omega-3 Fatty Acids (FISH OIL) 1000 MG CAPS Take 1000mg  every 3rd day   Yes [provider]  oxyCODONE-acetaminophen (PERCOCET) 7.5-325 MG tablet Take 1 tablet by mouth every 6 (six) hours as needed for severe pain. 11/04/16  Yes Molli Barrows, MD  potassium chloride SA (K-DUR,KLOR-CON) 20 MEQ tablet Take 20 mEq by mouth every other day. Take 20 meq every third day   Yes [provider]  Probiotic Product (PROBIOTIC COLON SUPPORT) CAPS Take 1 capsule by mouth at bedtime.   Yes [provider]  ramipril (ALTACE) 10 MG capsule Take 10 mg by mouth daily.    Yes [provider]  sucralfate (CARAFATE) 1 G tablet Take 1 g by mouth 2 (two) times daily as needed (esophageal burning).    Yes [provider]  tiotropium (SPIRIVA) 18 MCG inhalation capsule Place into inhaler and inhale. 09/04/16 09/04/17 Yes [provider]  Vitamin D, Ergocalciferol, (DRISDOL) 50000 UNITS CAPS capsule Take 50,000 Units by mouth every 7 (seven) days. thursdays   Yes [provider]    Allergies:    Allergies  Allergen Reactions  . Iodine Anaphylaxis and Swelling  . Other     ALLERGY TO METAL - BLACKENS SKIN AND CAUSES A RASH   . Tape Swelling    Skin comes off.  Paper tape is ok  . Shellfish Allergy Nausea And Vomiting    Episode of GI infection after eating clam chowder. Still eats shrimp and other seafood  Social History   Socioeconomic History  . Marital status: Single    Spouse name: Not on file  . Number of children: Not on file  . Years of education: Not on file  . Highest education level: Not on file  Social Needs  . Financial resource strain: Not on file  . Food insecurity - worry: Not on file  . Food insecurity - inability: Not on file  . Transportation needs - medical: Not on file  . Transportation needs - non-medical: Not on file  Occupational History  . Not on file  Tobacco Use  . Smoking status: Former Smoker    Packs/day: 0.25    Years: 50.00    Pack years: 12.50    Last attempt to quit: 08/22/2016    Years since quitting: 0.2  . Smokeless tobacco: Former Systems developer    Types: Snuff, Chew    Quit date: 01/13/1995  . Tobacco comment: Planned quit date August 28, 2016  Substance and Sexual Activity  . Alcohol use: No    Alcohol/week: 0.0 oz  . Drug use: No  . Sexual activity: Not on file  Other Topics Concern  . Not on file  Social History Narrative  . Not on file     Family History  Problem Relation Age of Onset  . Hypertension Father   . Deep vein thrombosis Father   . Dementia Mother   . Diabetes Sister   . Congestive Heart Failure Sister   . Congestive Heart Failure Brother   . Congestive Heart Failure Daughter   . Congestive Heart Failure Son   . Breast cancer Maternal Aunt        great aunt     ROS:  Please see the history of present illness.     All other systems reviewed and negative.    Physical Exam:  Blood pressure 100/64, pulse 97, height 5\' 7"  (1.702 m), weight (!) 309 lb (140.2 kg). General: Well developed, Morbidly obese  AA female in no acute distress. Head: Normocephalic, atraumatic, sclera non-icteric, no xanthomas, nares are without discharge. EENT: normal  Lymph Nodes:  none Neck: Negative for carotid bruits. JVD 8-10 +HJR. Back:without scoliosis kyphosis Lungs: Clear bilaterally to auscultation without wheezes, rales, or rhonchi. Breathing is unlabored. Heart: Irregular rate and rhythm with a 2/6 systolic  murmur . No rubs, or gallops appreciated. Abdomen: Soft, non-tender, non-distended with normoactive bowel sounds. No hepatomegaly. No rebound/guarding. No obvious abdominal masses. Msk:  Strength and tone appear normal for age. Extremities: No clubbing or cyanosis1+  edema.  Distal pedal pulses are 2+ and equal bilaterally. Skin: Warm and Dry Neuro: Alert and oriented X 3. CN III-XII intact Grossly normal sensory and motor function . Psych:  Responds to questions appropriately with a normal affect.      Labs: Cardiac Enzymes No results for input(s): CKTOTAL, CKMB, TROPONINI in the last 72 hours. CBC Lab Results  Component Value Date   WBC 4.0 10/19/2016   HGB 13.0 10/19/2016   HCT 39.9 10/19/2016   MCV 82.0 10/19/2016   PLT 262 10/19/2016   PROTIME: No results for input(s): LABPROT, INR in the last 72 hours. Chemistry No results for input(s): NA, K, CL, CO2, BUN, CREATININE, CALCIUM, PROT, BILITOT, ALKPHOS, ALT, AST, GLUCOSE in the last 168 hours.  Invalid input(s): LABALBU Lipids Lab Results  Component Value Date   CHOL (H) 03/16/2007    236        ATP III CLASSIFICATION:  <200  mg/dL   Desirable  200-239  mg/dL   Borderline High  >=240    mg/dL   High   HDL 71 03/16/2007   LDLCALC (H) 03/16/2007    150        Total Cholesterol/HDL:CHD Risk Coronary Heart Disease Risk Table                     Men   Women  1/2 Average Risk   3.4   3.3   TRIG 76 03/16/2007   BNP No results found for: PROBNP Thyroid Function Tests: No results for input(s): TSH, T4TOTAL, T3FREE,  THYROIDAB in the last 72 hours.  Invalid input(s): FREET3 Miscellaneous No results found for: DDIMER  Radiology/Studies:  No results found.  EKG: sinus 97 17/08/35 Frequent PVCs possibly 45% with 2 dominant morphologies right bundle superior axis and left bundle superior axis Biatrial enlargement  Assessment and Plan:  Nonischemic cardiomyopathy  PVCs 2 dominant morphologies 01-02% burden  Diastolic pressure equalization catheterization  Morbidly obese  Patient has a nonischemic cardiomyopathy.  There is evidence of diastolic pressure equalization suggesting this may be a restrictive process.  It may well just be that the PVCs are secondary.  This is further suggested by the complex ectopy pattern and burden.  Still however, I think the first therapeutic diagnostic intervention would be to try to suppress the PVCs.  To that end we will use amiodarone.  We have discussed side effects.  We will plan reassessment of PVC burden in about 8 weeks.  I will need to discuss with colleagues the implications of the diastolic pressure equalization and how to further clarify this.  No comment was made on the echocardiogram regarding restrictive filling patterns nor about the hemodynamic tracings from the catheterization.  We will decrease her aspirin dose to 81 mg  We will increase her carvedilol 3.125--6.25      Virl Axe \

## 2016-11-18 NOTE — Patient Instructions (Addendum)
Medication Instructions:  Your physician has recommended you make the following change in your medication:  DECREASE aspirin to 81mg  once daily INCREASE coreg to 6.25mg  twice daily START taking amiodarone 400mg  twice daily for two weeks then  DECREASE amiodarone to 400mg  once daily for two weeks then DECREASE amiodarone to 200 once daily (this will be permanent dose)   Labwork: none  Testing/Procedures: Your physician has recommended that you wear a holter monitor. Holter monitors are medical devices that record the heart's electrical activity. Doctors most often use these monitors to diagnose arrhythmias. Arrhythmias are problems with the speed or rhythm of the heartbeat. The monitor is a small, portable device. You can wear one while you do your normal daily activities. This is usually used to diagnose what is causing palpitations/syncope (passing out).    Follow-Up: Your physician recommends that you schedule a follow-up appointment with Dr. Caryl Comes in: 2 weeks after holter monitor    Any Other Special Instructions Will Be Listed Below (If Applicable).     If you need a refill on your cardiac medications before your next appointment, please call your pharmacy.   Holter Monitoring A Holter monitor is a small device that is used to detect abnormal heart rhythms. It clips to your clothing and is connected by wires to flat, sticky disks (electrodes) that attach to your chest. It is worn continuously for 24-48 hours. Follow these instructions at home:  Wear your Holter monitor at all times, even while exercising and sleeping, for as long as directed by your health care provider.  Make sure that the Holter monitor is safely clipped to your clothing or close to your body as recommended by your health care provider.  Do not get the monitor or wires wet.  Do not put body lotion or moisturizer on your chest.  Keep your skin clean.  Keep a diary of your daily activities, such as  walking and doing chores. If you feel that your heartbeat is abnormal or that your heart is fluttering or skipping a beat: ? Record what you are doing when it happens. ? Record what time of day the symptoms occur.  Return your Holter monitor as directed by your health care provider.  Keep all follow-up visits as directed by your health care provider. This is important. Get help right away if:  You feel lightheaded or you faint.  You have trouble breathing.  You feel pain in your chest, upper arm, or jaw.  You feel sick to your stomach and your skin is pale, cool, or damp.  You heartbeat feels unusual or abnormal. This information is not intended to replace advice given to you by your health care provider. Make sure you discuss any questions you have with your health care provider. Document Released: 09/27/2003 Document Revised: 06/06/2015 Document Reviewed: 08/07/2013 Elsevier Interactive Patient Education  2018 [employer]. Amiodarone tablets What is this medicine? AMIODARONE (a MEE oh da rone) is an antiarrhythmic drug. It helps make your heart beat regularly. Because of the side effects caused by this medicine, it is only used when other medicines have not worked. It is usually used for heartbeat problems that may be life threatening. This medicine may be used for other purposes; ask your health care provider or pharmacist if you have questions. COMMON BRAND NAME(S): Cordarone, Pacerone What should I tell my health care provider before I take this medicine? They need to know if you have any of these conditions: -liver disease -lung disease -other heart problems -  thyroid disease -an unusual or allergic reaction to amiodarone, iodine, other medicines, foods, dyes, or preservatives -pregnant or trying to get pregnant -breast-feeding How should I use this medicine? Take this medicine by mouth with a glass of water. Follow the directions on the prescription label. You can take  this medicine with or without food. However, you should always take it the same way each time. Take your doses at regular intervals. Do not take your medicine more often than directed. Do not stop taking except on the advice of your doctor or health care professional. A special MedGuide will be given to you by the pharmacist with each prescription and refill. Be sure to read this information carefully each time. Talk to your pediatrician regarding the use of this medicine in children. Special care may be needed. Overdosage: If you think you have taken too much of this medicine contact a poison control center or emergency room at once. NOTE: This medicine is only for you. Do not share this medicine with others. What if I miss a dose? If you miss a dose, take it as soon as you can. If it is almost time for your next dose, take only that dose. Do not take double or extra doses. What may interact with this medicine? Do not take this medicine with any of the following medications: -abarelix -apomorphine -arsenic trioxide -certain antibiotics like erythromycin, gemifloxacin, levofloxacin, pentamidine -certain medicines for depression like amoxapine, tricyclic antidepressants -certain medicines for fungal infections like fluconazole, itraconazole, ketoconazole, posaconazole, voriconazole -certain medicines for irregular heart beat like disopyramide, dofetilide, dronedarone, ibutilide, propafenone, sotalol -certain medicines for malaria like chloroquine, halofantrine -cisapride -droperidol -haloperidol -hawthorn -maprotiline -methadone -phenothiazines like chlorpromazine, mesoridazine, thioridazine -pimozide -ranolazine -red yeast rice -vardenafil -ziprasidone This medicine may also interact with the following medications: -antiviral medicines for HIV or AIDS -certain medicines for blood pressure, heart disease, irregular heart beat -certain medicines for cholesterol like atorvastatin,  cerivastatin, lovastatin, simvastatin -certain medicines for hepatitis C like sofosbuvir and ledipasvir; sofosbuvir -certain medicines for seizures like phenytoin -certain medicines for thyroid problems -certain medicines that treat or prevent blood clots like warfarin -cholestyramine -cimetidine -clopidogrel -cyclosporine -dextromethorphan -diuretics -fentanyl -general anesthetics -grapefruit juice -lidocaine -loratadine -methotrexate -other medicines that prolong the QT interval (cause an abnormal heart rhythm) -procainamide -quinidine -rifabutin, rifampin, or rifapentine -St. John's Wort -trazodone This list may not describe all possible interactions. Give your health care provider a list of all the medicines, herbs, non-prescription drugs, or dietary supplements you use. Also tell them if you smoke, drink alcohol, or use illegal drugs. Some items may interact with your medicine. What should I watch for while using this medicine? Your condition will be monitored closely when you first begin therapy. Often, this drug is first started in a hospital or other monitored health care setting. Once you are on maintenance therapy, visit your doctor or health care professional for regular checks on your progress. Because your condition and use of this medicine carry some risk, it is a good idea to carry an identification card, necklace or bracelet with details of your condition, medications, and doctor or health care professional. Dennis Bast may get drowsy or dizzy. Do not drive, use machinery, or do anything that needs mental alertness until you know how this medicine affects you. Do not stand or sit up quickly, especially if you are an older patient. This reduces the risk of dizzy or fainting spells. This medicine can make you more sensitive to the sun. Keep out of the sun.  If you cannot avoid being in the sun, wear protective clothing and use sunscreen. Do not use sun lamps or tanning  beds/booths. You should have regular eye exams before and during treatment. Call your doctor if you have blurred vision, see halos, or your eyes become sensitive to light. Your eyes may get dry. It may be helpful to use a lubricating eye solution or artificial tears solution. If you are going to have surgery or a procedure that requires contrast dyes, tell your doctor or health care professional that you are taking this medicine. What side effects may I notice from receiving this medicine? Side effects that you should report to your doctor or health care professional as soon as possible: -allergic reactions like skin rash, itching or hives, swelling of the face, lips, or tongue -blue-gray coloring of the skin -blurred vision, seeing blue green halos, increased sensitivity of the eyes to light -breathing problems -chest pain -dark urine -fast, irregular heartbeat -feeling faint or light-headed -intolerance to heat or cold -nausea or vomiting -pain and swelling of the scrotum -pain, tingling, numbness in feet, hands -redness, blistering, peeling or loosening of the skin, including inside the mouth -spitting up blood -stomach pain -sweating -unusual or uncontrolled movements of body -unusually weak or tired -weight gain or loss -yellowing of the eyes or skin Side effects that usually do not require medical attention (report to your doctor or health care professional if they continue or are bothersome): -change in sex drive or performance -constipation -dizziness -headache -loss of appetite -trouble sleeping This list may not describe all possible side effects. Call your doctor for medical advice about side effects. You may report side effects to FDA at 1-800-FDA-1088. Where should I keep my medicine? Keep out of the reach of children. Store at room temperature between 20 and 25 degrees C (68 and 77 degrees F). Protect from light. Keep container tightly closed. Throw away any unused  medicine after the expiration date. NOTE: This sheet is a summary. It may not cover all possible information. If you have questions about this medicine, talk to your doctor, pharmacist, or health care provider.  2018 Elsevier/Gold Standard (2013-04-03 19:48:11)  Cardiomyopathy, Adult  Cardiomyopathy is a long-term (chronic) disease of the heart muscle. The disease makes the heart muscle thick, weak, or stiff. As a result, the heart works harder to pump blood. Over time, cardiomyopathy can lead to heart failure. There are several types of cardiomyopathy:  Dilated cardiomyopathy. This type causes the ventricles to become weak and stretched out.  Hypertrophic cardiomyopathy. This type causes the heart muscle to thicken.  Restrictive cardiomyopathy. This type causes the heart muscle to become stiff.  Ischemic cardiomyopathy. This type involves narrowing arteries that cause the walls of the heart to get thinner.  Peripartum cardiomyopathy. This type occurs during pregnancy or shortly after pregnancy.  What are the causes? This condition may be caused by:  A gene that is passed down (inherited) from a family member.  A medical condition that damages the heart, such as: ? Diabetes. ? High blood pressure. ? Viral infection of the heart. ? Heart attack. ? Coronary heart disease.  Alcoholism.  Using illegal drugs or some prescription medicines.  Pregnancy.  Your body absorbing and storing too much iron (hemochromatosis).  Autoimmune diseases, connective tissue diseases, endocrine diseases, and muscle diseases.  Cancer treatments.  Buildup of proteins in your organs (amyloidosis), or inflammation in your organs (sarcoidosis).  Often, the cause is not known. What increases the risk? This  condition is more likely to develop in people who:  Have a family history of cardiomyopathy or other heart problems.  Are overweight or obese.  Use illegal drugs.  Abuse alcohol.  Have a  medical condition that damages the heart.  What are the signs or symptoms? Symptoms of this condition include:  Shortness of breath, especially during activity.  Fatigue.  An irregular heartbeat and heart murmurs.  Dizziness.  Light-headedness.  Fainting.  Chest pain.  Coughing.  Swelling in the lower legs, ankles, feet, abdomen, and neck veins.  Often, people with this condition have no symptoms. How is this diagnosed? This condition is diagnosed based on:  Your symptoms and medical history.  A physical exam.  Tests.  Tests may include:  Blood tests.  Imaging studies of your heart, such as: ? X-rays. ? An echocardiogram. ? An MRI.  An electrocardiogram (ECG). This records your heart's electrical activity.  A test in which you wear a portable device (event monitor) to record your heart's electrical activity while you go about your day.  A stress test. This monitors your heart's activity while exercising.  Cardiac catheterization. This procedure checks the blood pressure and blood flow in your heart.  An angiogram. This is an injection of dye into your arteries before imaging studies are taken.  Heart tissue biopsy. This removes a sample of heart tissue for examination.  How is this treated?  Treatment for this condition depends on the type of cardiomyopathy you have and the severity of your symptoms. If you do not have symptoms, you may not need treatment. If you need treatment, it may include:  Lifestyle changes, such as: ? Eating a heart-healthy diet that includes plenty of fruits, vegetables, and whole grains, and cutting down on salt (sodium). ? Maintaining a healthy weight, and losing weight, if needed. ? Getting regular exercise. ? Quitting smoking, if you smoke. ? Avoiding alcohol. ? Medicine to:  Lower your blood pressure.  Slow down your heart rate.  Keep your heart beating in a steady rhythm.  Clear excess fluids from your  body.  Prevent blood clots.  Balance minerals (electrolytes) in your body and get rid of extra sodium in your body.  Reduce inflammation.  Strengthen your heartbeat. ? Surgery to:  Repair a defect.  Remove thickened tissue.  Destroy tissues in the area of abnormal electrical activity (ablation).  Implant a device to treat serious heart rhythm problems (implantable cardioverter-defibrillator, or ICD), or a pacemaker.  Replace your heart (heart transplant) if all other treatments have failed (end stage).  Other treatments may include cardiac resynchronization therapy (CRT) or a left ventricular assist device (LVAD). Follow these instructions at home: Lifestyle  Eat a heart-healthy diet. Work with your health care provider or a registered dietitian to learn about healthy eating options.  Maintain a healthy weight.  Stay physically active. Ask your health care provider to suggest some activities that are good for you.  Do not use any products that contain nicotine or tobacco, such as cigarettes and e-cigarettes. If you need help quitting, ask your health care provider.  Limit alcohol intake to no more than one drink per day for nonpregnant women and no more than two drinks per day for men. One drink equals 12 oz of beer, 5 oz of wine, or 1 oz of hard liquor.  Try to get at least 7 hours of sleep each night.  Find healthy ways to manage stress. General instructions  Take over-the-counter and prescription medicines only  as told by your health care provider. Some medicines can be dangerous for your heart.  Tell all health care providers, including your dentist, that you have cardiomyopathy. When you visit the dentist or have surgery, ask your health care provider if you need antibiotics before having dental care or before the surgery.  Ask your health care provider if you should wear a medical identification bracelet. This may be important if you have a pacemaker or a  defibrillator.  Make sure you get all recommended vaccinations and an annual flu shot.  Work closely with your health care provider to manage any long-lasting (chronic) conditions.  Keep all follow-up visits as told by your health care provider. This is important, even if you do not have any symptoms. Your health care provider may need to make sure your condition is not getting worse. How is this prevented? This condition cannot be prevented. Parents, siblings, and children of people with this condition may be at risk for the condition. It is a good idea for them to get screened for the condition because it is best when cardiomyopathy is found early. Screening is done with an ECG and echocardiogram. People who want to start a family may also want to meet with a genetic counselor to discuss the risk of having a child with cardiomyopathy. Contact a health care provider if:  Your symptoms get worse.  You have new symptoms. Get help right away if:  You have severe chest pain.  You have shortness of breath.  You cough up pink, bubbly material.  You have sudden sweating.  You feel nauseous and you vomit.  You suddenly become light-headed or dizzy.  You feel your heart beating very quickly.  It feels like your heart is skipping beats. These symptoms may represent a serious problem that is an emergency. Do not wait to see if the symptoms will go away. Get medical help right away. Call your local emergency services (911 in the [place] Do not drive yourself to the hospital. This information is not intended to replace advice given to you by your health care provider. Make sure you discuss any questions you have with your health care provider. Document Released: 03/13/2004 Document Revised: 08/27/2015 Document Reviewed: 07/01/2015 Elsevier Interactive Patient Education  2018 [employer].  Premature Ventricular Contraction A premature ventricular contraction (PVC) is a common irregularity  in the normal heart rhythm. These contractions are extra heartbeats that start in the heart ventricles and occur too early in the normal sequence. During the PVC, the heart's normal electrical pathway is not used, so the beat is shorter and less effective. In most cases, these contractions come and go and do not require treatment. What are the causes? In many cases, the cause may not be known. Common causes of the condition include:  Smoking.  Drinking alcohol.  Caffeine.  Certain medicines.  Some illegal drugs.  Stress.  Certain medical conditions can also cause PVCs:  Changes in minerals in the blood (electrolytes).  Heart failure.  Heart valve problems.  Low blood oxygen levels or high carbon dioxide levels.  Heart attack, or coronary artery disease.  What are the signs or symptoms? The main symptom of this condition is a fast or skipped heartbeat (palpitations). Other symptoms include:  Chest pain.  Shortness of breath.  Feeling tired.  Dizziness.  In some cases, there are no symptoms. How is this diagnosed? This condition may be diagnosed based on:  Your medical history.  A physical exam. During the  exam, the health care provider will check for irregular heartbeats.  Tests, such as: ? An ECG (electrocardiogram) to monitor the electrical activity of your heart. ? Holter monitor testing. This involves wearing a device that clips to your clothing and monitors the electrical activity of your heart over longer periods of time. ? Stress tests to see how exercise affects your heart rhythm and blood supply. ? Echocardiogram. This test uses sound waves (ultrasound) to produce an image of your heart. ? Electrophysiology study. This test checks the electric pathways in your heart.  How is this treated? Treatment depends on any underlying conditions, the type of PVCs that you are having, and how much the symptoms are interfering with your daily life. Possible  treatments include:  Avoiding things that can trigger the premature contractions, such as caffeine or alcohol.  Medicines. These may be given if symptoms are severe or if the extra heartbeats are frequent.  Treatment for any underlying condition that is found to be the cause of the contractions.  Catheter ablation. This procedure destroys the heart tissues that send abnormal signals.  In some cases, no treatment is required. Follow these instructions at home: Lifestyle Follow these instructions as told by your health care provider:  Do not use any products that contain nicotine or tobacco, such as cigarettes and e-cigarettes. If you need help quitting, ask your health care provider.  If caffeine triggers episodes of PVC, do not eat, drink, or use anything with caffeine in it.  If caffeine does not seem to trigger episodes, consume caffeine in moderation.  If alcohol triggers episodes of PVC, do not drink alcohol.  If alcohol does not seem to trigger episodes, limit alcohol intake to no more than 1 drink a day for nonpregnant women and 2 drinks a day for men. One drink equals 12 oz of beer, 5 oz of wine, or 1 oz of hard liquor.  Exercise regularly. Ask your health care provider what type of exercise is safe for you.  Find healthy ways to manage stress. Avoid stressful situations when possible.  Try to get at least 7-9 hours of sleep each night, or as much as recommended by your health care provider.  Do not use illegal drugs.  General instructions  Take over-the-counter and prescription medicines only as told by your health care provider.  Keep all follow-up visits as told by your health care provider. This is important. Get help right away if:  You feel palpitations that are frequent or continual.  You have chest pain.  You have shortness of breath.  You have sweating for no reason.  You have nausea and vomiting.  You become light-headed or you faint. This  information is not intended to replace advice given to you by your health care provider. Make sure you discuss any questions you have with your health care provider. Document Released: 08/16/2003 Document Revised: 08/23/2015 Document Reviewed: 06/05/2015 Elsevier Interactive Patient Education  [employer].

## 2016-11-19 ENCOUNTER — Ambulatory Visit (INDEPENDENT_AMBULATORY_CARE_PROVIDER_SITE_OTHER): Payer: Medicare Other

## 2016-11-19 ENCOUNTER — Telehealth: Payer: Self-pay | Admitting: *Deleted

## 2016-11-19 DIAGNOSIS — R002 Palpitations: Secondary | ICD-10-CM

## 2016-11-19 NOTE — Telephone Encounter (Signed)
Pt requiring PA on Amiodarone 400 mg tablet. PA has been sent through [employer]. Awaiting PA approval. Noted as below: [facility] is reviewing your PA request. Typically an electronic response will be received within 72 hours.

## 2016-11-20 ENCOUNTER — Other Ambulatory Visit: Payer: Self-pay

## 2016-11-20 ENCOUNTER — Telehealth: Payer: Self-pay | Admitting: Internal Medicine

## 2016-11-20 MED ORDER — AMIODARONE HCL 200 MG PO TABS: ORAL_TABLET | ORAL | 0 refills | Status: DC

## 2016-11-20 NOTE — Telephone Encounter (Signed)
Prior authorization denied for amiodarone 400mg  tablets. Suggested 200mg  dosage. Sent new prescription for amiodarone 200mg  tablets. Take 2 tablets twice daily for 2 weeks then 2 tablets once daily for two weeks. #90, 0 refills. Submitted to [employer]. S/w pharmacist and asked to cancel amiodarone 400mg  prescription.

## 2016-11-23 ENCOUNTER — Telehealth: Payer: Self-pay | Admitting: Internal Medicine

## 2016-11-23 NOTE — Telephone Encounter (Signed)
Calling regarding PA for Pacerone. Fax is [phone]

## 2016-11-24 ENCOUNTER — Ambulatory Visit: Payer: Medicare Other | Admitting: Internal Medicine

## 2016-11-24 NOTE — Telephone Encounter (Signed)
Called Wendy Sanchez back. He was submitting the PA appeal for amiodarone. Provided the following information that patient had tried carvedilol.  Also, provided the following information from office visit note: "Patient has a nonischemic cardiomyopathy.  There is evidence of diastolic pressure equalization suggesting this may be a restrictive process.  It may well just be that the PVCs are secondary.  This is further suggested by the complex ectopy pattern and burden.  Still however, I think the first therapeutic diagnostic intervention would be to try to suppress the PVCs.  To that end we will use amiodarone."  He is resubmitting the request and we will received letter within 5-7 business days.

## 2016-11-25 ENCOUNTER — Ambulatory Visit
Admission: RE | Admit: 2016-11-25 | Discharge: 2016-11-25 | Disposition: A | Discharge status: Home / Self Care | Payer: Medicare Other | Source: Ambulatory Visit | Attending: Internal Medicine | Admitting: Internal Medicine

## 2016-11-25 DIAGNOSIS — R002 Palpitations: Secondary | ICD-10-CM | POA: Insufficient documentation

## 2016-12-09 ENCOUNTER — Other Ambulatory Visit: Payer: Self-pay | Admitting: Anesthesiology

## 2016-12-09 ENCOUNTER — Other Ambulatory Visit (INDEPENDENT_AMBULATORY_CARE_PROVIDER_SITE_OTHER): Payer: Self-pay | Admitting: Orthopaedic Surgery

## 2016-12-09 ENCOUNTER — Other Ambulatory Visit: Payer: Self-pay | Admitting: Internal Medicine

## 2016-12-10 ENCOUNTER — Encounter: Payer: Self-pay | Admitting: Internal Medicine

## 2016-12-10 ENCOUNTER — Ambulatory Visit: Payer: Medicare Other | Admitting: Internal Medicine

## 2016-12-10 VITALS — BP 120/60 | HR 90 | Ht 68.0 in | Wt 315.0 lb

## 2016-12-10 DIAGNOSIS — I493 Ventricular premature depolarization: Secondary | ICD-10-CM

## 2016-12-10 DIAGNOSIS — R002 Palpitations: Secondary | ICD-10-CM | POA: Diagnosis not present

## 2016-12-10 DIAGNOSIS — I5022 Chronic systolic (congestive) heart failure: Secondary | ICD-10-CM | POA: Diagnosis not present

## 2016-12-10 DIAGNOSIS — I428 Other cardiomyopathies: Secondary | ICD-10-CM

## 2016-12-10 NOTE — Telephone Encounter (Signed)
Please advise 

## 2016-12-10 NOTE — Patient Instructions (Addendum)
Medication Instructions: Your physician recommends that you continue on your current medications as directed. Please refer to the Current Medication list given to you today.  Labwork: None Ordered  Procedures/Testing: Your physician has recommended that you wear a 24 hour holter monitor - Prior to your next appointment. Holter monitors are medical devices that record the heart's electrical activity. Doctors most often use these monitors to diagnose arrhythmias. Arrhythmias are problems with the speed or rhythm of the heartbeat. The monitor is a small, portable device. You can wear one while you do your normal daily activities. This is usually used to diagnose what is causing palpitations/syncope (passing out)   Follow-Up: Your physician recommends that you schedule a follow-up appointment in: 2 MONTHS with Dr. Caryl Comes.   If you need a refill on your cardiac medications before your next appointment, please call your pharmacy.

## 2016-12-10 NOTE — Progress Notes (Signed)
Patient Care Team: [name], MD as PCP - General (Family Medicine)   HPI  Wendy Sanchez is a 66 y.o. 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  Initially seen in consultation 11/18  amiodarone was initiated a few weeks ago.  Thus far she is tolerating it without difficulty  She remains with modest sob, but no edema     DATE TEST    6/18    Echo   EF 30 % LVE/LAE  8/18    Cath   EF 25 % Normal CAs        Date Cr K Mg Hgb   9/18 0.8 4.5   12.8            Past Medical History:  Diagnosis Date  . Allergic rhinitis   . Arthritis   . Asthma    problems with fumes and aerosols cause asthma  . Complication of anesthesia    awakens during surgery; has occurred with last 3-4 surgeries   . Environmental allergies    fumes   . Fibromyalgia   . GERD (gastroesophageal reflux disease)   . History of bronchitis   . Hypertension   . Numbness    hands bilat when driving; improves when not preforming task   . Status post gastric banding   . Tinnitus    comes and goes   . Vertigo    6 months ago approx     Past Surgical History:  Procedure Laterality Date  . ABDOMINAL HYSTERECTOMY  1979   left ovary remains  . ABDOMINAL HYSTERECTOMY    . CHOLECYSTECTOMY    . fibrous tissue removed from right shoulder and back of neck      2 years ago   . GANGLION CYST EXCISION    . GASTRIC BYPASS  1980   states had bypass with banding and band left in -  . KNEE SURGERY Bilateral    both knees  2001  . RIGHT/LEFT HEART CATH AND CORONARY ANGIOGRAPHY Bilateral 08/12/2016   Procedure: Right/Left Heart Cath and Coronary Angiography;  Surgeon: Yolonda Kida, MD;  Location: [facility];  Service: Cardiovascular;  Laterality: Bilateral;  . TONSILLECTOMY     age 16  . TOTAL KNEE ARTHROPLASTY Left 08/02/2015   Procedure: LEFT TOTAL KNEE ARTHROPLASTY;  Surgeon: Mcarthur Rossetti, MD;  Location: [facility];  Service: Orthopedics;   Laterality: Left;  . TOTAL KNEE ARTHROPLASTY Right 12/13/2015   Procedure: RIGHT TOTAL KNEE ARTHROPLASTY;  Surgeon: Mcarthur Rossetti, MD;  Location: [facility];  Service: Orthopedics;  Laterality: Right;    Current Outpatient Medications  Medication Sig Dispense Refill  . albuterol (PROVENTIL HFA;VENTOLIN HFA) 108 (90 Base) MCG/ACT inhaler Inhale 2 puffs into the lungs every 6 (six) hours as needed for wheezing or shortness of breath.    Derrill Memo ON 12/18/2016] amiodarone (PACERONE) 200 MG tablet Take 1 tablet (200 mg total) daily by mouth. 30 tablet 3  . amiodarone (PACERONE) 200 MG tablet Take 2 tablets twice daily for two weeks then take 2 tablets once daily for two weeks. 90 tablet 0  . aspirin EC 81 MG tablet Take 1 tablet (81 mg total) daily by mouth. 90 tablet 3  . carvedilol (COREG) 6.25 MG tablet Take 1 tablet (6.25 mg total) 2 (two) times daily by mouth. 180 tablet 3  . clotrimazole (LOTRIMIN) 1 % cream Apply 1 application topically 2 (two) times daily as needed (rash).     Marland Kitchen  cyclobenzaprine (FLEXERIL) 10 MG tablet TAKE 1 TABLET BY MOUTH THREE TIMES DAILY AS NEEDED FOR MUSCLE SPASM 60 tablet 0  . dexlansoprazole (DEXILANT) 60 MG capsule Take 60 mg by mouth daily.    . diclofenac sodium (VOLTAREN) 1 % GEL Apply 2 g topically 4 (four) times daily as needed (pain).     Marland Kitchen docusate sodium (COLACE) 100 MG capsule Take 200 mg by mouth at bedtime.     . folic acid (FOLVITE) 1 MG tablet Take 1 mg by mouth at bedtime.     . furosemide (LASIX) 40 MG tablet Take 40 mg by mouth daily.     Marland Kitchen gabapentin (NEURONTIN) 300 MG capsule Take 2 capsules (600 mg total) by mouth 3 (three) times daily. (Patient taking differently: Take 600 mg by mouth 2 (two) times daily. May take an additional 600mg  as needed for pain) 180 capsule 1  . ibuprofen (ADVIL,MOTRIN) 800 MG tablet 800 mg every 8 (eight) hours as needed.     Marland Kitchen levocetirizine (XYZAL) 5 MG tablet Take 5 mg by mouth at bedtime.     . mirabegron ER  (MYRBETRIQ) 50 MG TB24 tablet Take 50 mg by mouth daily.    . montelukast (SINGULAIR) 10 MG tablet Take 10 mg by mouth at bedtime.    . Multiple Vitamins-Minerals (ICAPS AREDS 2) CAPS Take 1 capsule by mouth 2 (two) times daily.     . Multiple Vitamins-Minerals (MULTIVITAMIN GUMMIES ADULTS) CHEW Chew 2 tablets by mouth daily.     . nortriptyline (PAMELOR) 50 MG capsule Take 50 mg by mouth at bedtime.    . Omega-3 Fatty Acids (FISH OIL) 1000 MG CAPS Take 1000mg  every 3rd day    . oxyCODONE-acetaminophen (PERCOCET) 7.5-325 MG tablet Take 1 tablet by mouth every 6 (six) hours as needed for severe pain. 120 tablet 0  . potassium chloride SA (K-DUR,KLOR-CON) 20 MEQ tablet Take 20 mEq by mouth every other day. Take 20 meq every third day    . Probiotic Product (PROBIOTIC COLON SUPPORT) CAPS Take 1 capsule by mouth at bedtime.    . ramipril (ALTACE) 10 MG capsule Take 10 mg by mouth daily.     . sucralfate (CARAFATE) 1 G tablet Take 1 g by mouth 2 (two) times daily as needed (esophageal burning).     Marland Kitchen tiotropium (SPIRIVA) 18 MCG inhalation capsule Place into inhaler and inhale.    . Vitamin D, Ergocalciferol, (DRISDOL) 50000 UNITS CAPS capsule Take 50,000 Units by mouth every 7 (seven) days. thursdays     No current facility-administered medications for this visit.     Allergies  Allergen Reactions  . Hydrocodone-Acetaminophen Swelling  . Iodine Anaphylaxis and Swelling  . Other     ALLERGY TO METAL - BLACKENS SKIN AND CAUSES A RASH   . Tape Swelling    Skin comes off.  Paper tape is ok  . Shellfish Allergy Nausea And Vomiting    Episode of GI infection after eating clam chowder. Still eats shrimp and other seafood      Review of Systems negative except from HPI and PMH  Physical Exam BP 120/60 (BP Location: Left Arm, Patient Position: Sitting, Cuff Size: Large)   Pulse 90   Ht 5\' 8"  (1.727 m)   Wt (!) 315 lb (142.9 kg)   BMI 47.90 kg/m  Well developed and Morbidly obese in no acute  distress HENT normal E scleral and icterus clear Neck Supple JVP flat; carotids brisk and full Clear to ausculation  Regular rate and rhythm, no murmurs gallops or rub Soft with active bowel sounds No clubbing cyanosis Edema Alert and oriented, grossly normal motor and sensory function Skin Warm and Dry  ECG shows about 10-15% PVCs      Assessment and Plan:  Nonischemic cardiomyopathy  PVCs-- 2 dominant morphologies    Diastolic pressure equalization catheterization  Morbidly obese   She is tolerating the amio well and there is a significant decrease already in the PVC burden.  We will continue the amiodaore.  We wwill need to check surveillance labs, incl baseline TSH and plan to reassess the PVC burden by holter in 2 months  Her renal function is sufficiently good that we will add aldactone if LV function remains impaired at next visit;  After this would switch from ACE to entresto euvolemic  continus current dose of diuretics       Current medicines are reviewed at length with the patient today .  The patient does not have concerns regarding medicines.

## 2016-12-28 ENCOUNTER — Other Ambulatory Visit: Payer: Self-pay

## 2016-12-28 ENCOUNTER — Encounter: Payer: Self-pay | Admitting: Anesthesiology

## 2016-12-28 ENCOUNTER — Ambulatory Visit: Payer: Medicare Other | Attending: Anesthesiology | Admitting: Anesthesiology

## 2016-12-28 VITALS — BP 108/77 | HR 82 | Temp 98.0°F | Ht 68.0 in | Wt 314.0 lb

## 2016-12-28 DIAGNOSIS — Z7982 Long term (current) use of aspirin: Secondary | ICD-10-CM | POA: Insufficient documentation

## 2016-12-28 DIAGNOSIS — G894 Chronic pain syndrome: Secondary | ICD-10-CM

## 2016-12-28 DIAGNOSIS — M5431 Sciatica, right side: Secondary | ICD-10-CM | POA: Insufficient documentation

## 2016-12-28 DIAGNOSIS — M545 Low back pain, unspecified: Secondary | ICD-10-CM

## 2016-12-28 DIAGNOSIS — M51369 Other intervertebral disc degeneration, lumbar region without mention of lumbar back pain or lower extremity pain: Secondary | ICD-10-CM

## 2016-12-28 DIAGNOSIS — F1190 Opioid use, unspecified, uncomplicated: Secondary | ICD-10-CM | POA: Diagnosis not present

## 2016-12-28 DIAGNOSIS — M19011 Primary osteoarthritis, right shoulder: Secondary | ICD-10-CM | POA: Diagnosis not present

## 2016-12-28 DIAGNOSIS — Z79899 Other long term (current) drug therapy: Secondary | ICD-10-CM | POA: Diagnosis not present

## 2016-12-28 DIAGNOSIS — M25551 Pain in right hip: Secondary | ICD-10-CM | POA: Insufficient documentation

## 2016-12-28 DIAGNOSIS — M48062 Spinal stenosis, lumbar region with neurogenic claudication: Secondary | ICD-10-CM

## 2016-12-28 DIAGNOSIS — G8929 Other chronic pain: Secondary | ICD-10-CM

## 2016-12-28 DIAGNOSIS — M1712 Unilateral primary osteoarthritis, left knee: Secondary | ICD-10-CM | POA: Diagnosis not present

## 2016-12-28 DIAGNOSIS — M25579 Pain in unspecified ankle and joints of unspecified foot: Secondary | ICD-10-CM | POA: Insufficient documentation

## 2016-12-28 DIAGNOSIS — M5136 Other intervertebral disc degeneration, lumbar region: Secondary | ICD-10-CM | POA: Diagnosis not present

## 2016-12-28 DIAGNOSIS — M5387 Other specified dorsopathies, lumbosacral region: Secondary | ICD-10-CM

## 2016-12-28 DIAGNOSIS — Z79891 Long term (current) use of opiate analgesic: Secondary | ICD-10-CM | POA: Insufficient documentation

## 2016-12-28 MED ORDER — CYCLOBENZAPRINE HCL 10 MG PO TABS: 10.0000 mg | ORAL_TABLET | Freq: Three times a day (TID) | ORAL | 5 refills | Status: DC | PRN

## 2016-12-28 MED ORDER — IBUPROFEN 800 MG PO TABS: 800.0000 mg | ORAL_TABLET | Freq: Three times a day (TID) | ORAL | 5 refills | Status: DC | PRN

## 2016-12-28 MED ORDER — GABAPENTIN 300 MG PO CAPS: 600.0000 mg | ORAL_CAPSULE | Freq: Three times a day (TID) | ORAL | 5 refills | Status: DC

## 2016-12-28 MED ORDER — OXYCODONE-ACETAMINOPHEN 7.5-325 MG PO TABS: 1.0000 | ORAL_TABLET | Freq: Four times a day (QID) | ORAL | 0 refills | Status: DC | PRN

## 2016-12-28 NOTE — Progress Notes (Signed)
Subjective:  Patient ID: Wendy Sanchez, female    DOB: 1950-08-27  Age: 66 y.o. MRN: [id number]  CC: Ankle Pain (top)   Procedure: None  HPI Wendy Sanchez presents for reevaluation.  She was last seen 2 months ago.  She has been doing well with her medications however she continues to have diffuse body pain.  The quality characteristic and distribution of this remained stable in nature.  Her strength is been doing well.  She has been doing some exercises for physical therapy and has attempted to lose some weight but this is been unsuccessful.  No change in lower extremity strength or function her bowel bladder functions been noted and she is tolerating her medications well.  Based on her narcotic assessment sheet she continues to derive good functional lifestyle improvement with her medicines.  Outpatient Medications Prior to Visit  Medication Sig Dispense Refill  . albuterol (PROVENTIL HFA;VENTOLIN HFA) 108 (90 Base) MCG/ACT inhaler Inhale 2 puffs into the lungs every 6 (six) hours as needed for wheezing or shortness of breath.    Marland Kitchen amiodarone (PACERONE) 200 MG tablet Take 1 tablet (200 mg total) daily by mouth. 30 tablet 3  . amoxicillin-clavulanate (AUGMENTIN) 875-125 MG tablet     . aspirin EC 81 MG tablet Take 1 tablet (81 mg total) daily by mouth. 90 tablet 3  . carvedilol (COREG) 6.25 MG tablet Take 1 tablet (6.25 mg total) 2 (two) times daily by mouth. 180 tablet 3  . clotrimazole (LOTRIMIN) 1 % cream Apply 1 application topically 2 (two) times daily as needed (rash).     Marland Kitchen dexlansoprazole (DEXILANT) 60 MG capsule Take 60 mg by mouth daily.    . diclofenac sodium (VOLTAREN) 1 % GEL Apply 2 g topically 4 (four) times daily as needed (pain).     Marland Kitchen docusate sodium (COLACE) 100 MG capsule Take 200 mg by mouth at bedtime.     . folic acid (FOLVITE) 1 MG tablet Take 1 mg by mouth at bedtime.     . furosemide (LASIX) 40 MG tablet Take 40 mg by mouth daily.     Marland Kitchen levocetirizine (XYZAL)  5 MG tablet Take 5 mg by mouth at bedtime.     . mirabegron ER (MYRBETRIQ) 50 MG TB24 tablet Take 50 mg by mouth daily.    . montelukast (SINGULAIR) 10 MG tablet Take 10 mg by mouth at bedtime.    . Multiple Vitamins-Minerals (ICAPS AREDS 2) CAPS Take 1 capsule by mouth 2 (two) times daily.     . Multiple Vitamins-Minerals (MULTIVITAMIN GUMMIES ADULTS) CHEW Chew 2 tablets by mouth daily.     . nortriptyline (PAMELOR) 50 MG capsule Take 50 mg by mouth at bedtime.    Marland Kitchen nystatin (MYCOSTATIN) 100000 UNIT/ML suspension     . Omega-3 Fatty Acids (FISH OIL) 1000 MG CAPS Take 1000mg  every 3rd day    . potassium chloride SA (K-DUR,KLOR-CON) 20 MEQ tablet Take 20 mEq by mouth every other day. Take 20 meq every third day    . predniSONE (DELTASONE) 5 MG tablet Take 5 mg by mouth taper from 4 doses each day to 1 dose and stop.    . Probiotic Product (PROBIOTIC COLON SUPPORT) CAPS Take 1 capsule by mouth at bedtime.    . ramipril (ALTACE) 10 MG capsule Take 10 mg by mouth daily.     . sucralfate (CARAFATE) 1 G tablet Take 1 g by mouth 2 (two) times daily as needed (esophageal burning).     Marland Kitchen  tiotropium (SPIRIVA) 18 MCG inhalation capsule Place into inhaler and inhale.    . Vitamin D, Ergocalciferol, (DRISDOL) 50000 UNITS CAPS capsule Take 50,000 Units by mouth every 7 (seven) days. thursdays    . cyclobenzaprine (FLEXERIL) 10 MG tablet TAKE 1 TABLET BY MOUTH THREE TIMES DAILY AS NEEDED FOR MUSCLE SPASM 60 tablet 0  . gabapentin (NEURONTIN) 300 MG capsule Take 2 capsules (600 mg total) by mouth 3 (three) times daily. (Patient taking differently: Take 600 mg by mouth 2 (two) times daily. May take an additional 600mg  as needed for pain) 180 capsule 1  . ibuprofen (ADVIL,MOTRIN) 800 MG tablet 800 mg every 8 (eight) hours as needed.     Marland Kitchen oxyCODONE-acetaminophen (PERCOCET) 7.5-325 MG tablet Take 1 tablet by mouth every 6 (six) hours as needed for severe pain. 120 tablet 0  . amiodarone (PACERONE) 200 MG tablet Take  2 tablets twice daily for two weeks then take 2 tablets once daily for two weeks. (Patient not taking: Reported on 12/28/2016) 90 tablet 0   No facility-administered medications prior to visit.     Review of Systems CNS: No confusion or sedation Cardiac: No angina or palpitations GI: No abdominal pain or constipation   Objective:  BP 108/77   Pulse 82   Temp 98 F (36.7 C)   Ht 5\' 8"  (1.727 m)   Wt (!) 314 lb (142.4 kg)   SpO2 100%   BMI 47.74 kg/m    BP Readings from Last 3 Encounters:  12/28/16 108/77  12/10/16 120/60  11/18/16 100/64     Wt Readings from Last 3 Encounters:  12/28/16 (!) 314 lb (142.4 kg)  12/10/16 (!) 315 lb (142.9 kg)  11/18/16 (!) 309 lb (140.2 kg)     Physical Exam Pt is alert and oriented PERRL EOMI HEART IS RRR no murmur or rub LCTA no wheezing or rales MUSCULOSKELETAL no evidence of any paraspinous muscle trigger points.  Her lower extremity muscle tone and bulk is at baseline as well.  Labs  No results found for: HGBA1C Lab Results  Component Value Date   LDLCALC (H) 03/16/2007    150        Total Cholesterol/HDL:CHD Risk Coronary Heart Disease Risk Table                     Men   Women  1/2 Average Risk   3.4   3.3   CREATININE 1.15 (H) 10/19/2016    -------------------------------------------------------------------------------------------------------------------- Lab Results  Component Value Date   WBC 4.0 10/19/2016   HGB 13.0 10/19/2016   HCT 39.9 10/19/2016   PLT 262 10/19/2016   GLUCOSE 103 (H) 10/19/2016   CHOL (H) 03/16/2007    236        ATP III CLASSIFICATION:  <200     mg/dL   Desirable  200-239  mg/dL   Borderline High  >=240    mg/dL   High   TRIG 76 03/16/2007   HDL 71 03/16/2007   LDLCALC (H) 03/16/2007    150        Total Cholesterol/HDL:CHD Risk Coronary Heart Disease Risk Table                     Men   Women  1/2 Average Risk   3.4   3.3   NA 139 10/19/2016   K 4.8 10/19/2016   CL 104  10/19/2016   CREATININE 1.15 (H) 10/19/2016   BUN 18 10/19/2016  CO2 27 10/19/2016   INR 1.11 07/26/2015    --------------------------------------------------------------------------------------------------------------------- No results found.   Assessment & Plan:   Wendy Sanchez was seen today for ankle pain.  Diagnoses and all orders for this visit:  Chronic pain syndrome  Chronic, continuous use of opioids -     Discontinue: oxyCODONE-acetaminophen (PERCOCET) 7.5-325 MG tablet; Take 1 tablet by mouth every 6 (six) hours as needed for severe pain. -     oxyCODONE-acetaminophen (PERCOCET) 7.5-325 MG tablet; Take 1 tablet by mouth every 6 (six) hours as needed for severe pain.  Primary osteoarthritis of right shoulder  Chronic pain of right hip  Spinal stenosis of lumbar region with neurogenic claudication  Sciatica of right side associated with disorder of lumbosacral spine  DDD (degenerative disc disease), lumbar  Primary osteoarthritis of left knee  Chronic bilateral low back pain without sciatica  Other orders -     cyclobenzaprine (FLEXERIL) 10 MG tablet; Take 1 tablet (10 mg total) by mouth 3 (three) times daily as needed. for muscle spams -     ibuprofen (ADVIL,MOTRIN) 800 MG tablet; Take 1 tablet (800 mg total) by mouth every 8 (eight) hours as needed. -     gabapentin (NEURONTIN) 300 MG capsule; Take 2 capsules (600 mg total) by mouth 3 (three) times daily.        ----------------------------------------------------------------------------------------------------------------------  Problem List Items Addressed This Visit      Unprioritized   Chronic pain syndrome - Primary   Osteoarthritis of left knee   Relevant Medications   predniSONE (DELTASONE) 5 MG tablet   cyclobenzaprine (FLEXERIL) 10 MG tablet   oxyCODONE-acetaminophen (PERCOCET) 7.5-325 MG tablet   ibuprofen (ADVIL,MOTRIN) 800 MG tablet   Spinal stenosis of lumbar region    Other Visit  Diagnoses    Chronic, continuous use of opioids       Relevant Medications   oxyCODONE-acetaminophen (PERCOCET) 7.5-325 MG tablet   Primary osteoarthritis of right shoulder       Relevant Medications   predniSONE (DELTASONE) 5 MG tablet   cyclobenzaprine (FLEXERIL) 10 MG tablet   oxyCODONE-acetaminophen (PERCOCET) 7.5-325 MG tablet   ibuprofen (ADVIL,MOTRIN) 800 MG tablet   Chronic pain of right hip       Relevant Medications   predniSONE (DELTASONE) 5 MG tablet   cyclobenzaprine (FLEXERIL) 10 MG tablet   oxyCODONE-acetaminophen (PERCOCET) 7.5-325 MG tablet   ibuprofen (ADVIL,MOTRIN) 800 MG tablet   gabapentin (NEURONTIN) 300 MG capsule   Sciatica of right side associated with disorder of lumbosacral spine       Relevant Medications   cyclobenzaprine (FLEXERIL) 10 MG tablet   gabapentin (NEURONTIN) 300 MG capsule   DDD (degenerative disc disease), lumbar       Relevant Medications   predniSONE (DELTASONE) 5 MG tablet   cyclobenzaprine (FLEXERIL) 10 MG tablet   oxyCODONE-acetaminophen (PERCOCET) 7.5-325 MG tablet   ibuprofen (ADVIL,MOTRIN) 800 MG tablet   Chronic bilateral low back pain without sciatica       Relevant Medications   predniSONE (DELTASONE) 5 MG tablet   cyclobenzaprine (FLEXERIL) 10 MG tablet   oxyCODONE-acetaminophen (PERCOCET) 7.5-325 MG tablet   ibuprofen (ADVIL,MOTRIN) 800 MG tablet        ----------------------------------------------------------------------------------------------------------------------  1. Chronic pain syndrome Lumbar continue with her efforts at weight loss stretching strengthening and physical therapy.  2. Chronic, continuous use of opioids She is doing well with her current medication regimen.  Refills will be given for January 3 and February 2.  We reviewed the [place] practitioner database information and it is appropriate. - oxyCODONE-acetaminophen (PERCOCET) 7.5-325 MG tablet; Take 1 tablet by mouth every 6 (six) hours  as needed for severe pain.  Dispense: 120 tablet; Refill: 0  3. Primary osteoarthritis of right shoulder   4. Chronic pain of right hip   5. Spinal stenosis of lumbar region with neurogenic claudication   6. Sciatica of right side associated with disorder of lumbosacral spine   7. DDD (degenerative disc disease), lumbar We have given her options for epidural steroid injection however she refuses these.  8. Primary osteoarthritis of left knee   9. Chronic bilateral low back pain without sciatica     ----------------------------------------------------------------------------------------------------------------------  I have changed Wendy Sanchez's cyclobenzaprine and ibuprofen. I am also having her maintain her dexlansoprazole, sucralfate, ramipril, montelukast, furosemide, diclofenac sodium, folic acid, mirabegron ER, potassium chloride SA, Vitamin D (Ergocalciferol), clotrimazole, PROBIOTIC COLON SUPPORT, MULTIVITAMIN GUMMIES ADULTS, nortriptyline, levocetirizine, ICAPS AREDS 2, docusate sodium, albuterol, Fish Oil, tiotropium, aspirin EC, carvedilol, amiodarone, amiodarone, amoxicillin-clavulanate, nystatin, predniSONE, oxyCODONE-acetaminophen, and gabapentin.   Meds ordered this encounter  Medications  . cyclobenzaprine (FLEXERIL) 10 MG tablet    Sig: Take 1 tablet (10 mg total) by mouth 3 (three) times daily as needed. for muscle spams    Dispense:  90 tablet    Refill:  5    Please consider 90 day supplies to promote better adherence  . DISCONTD: oxyCODONE-acetaminophen (PERCOCET) 7.5-325 MG tablet    Sig: Take 1 tablet by mouth every 6 (six) hours as needed for severe pain.    Dispense:  120 tablet    Refill:  0    Do not fill until [id number]  . oxyCODONE-acetaminophen (PERCOCET) 7.5-325 MG tablet    Sig: Take 1 tablet by mouth every 6 (six) hours as needed for severe pain.    Dispense:  120 tablet    Refill:  0    Do not fill until [id number]  . ibuprofen  (ADVIL,MOTRIN) 800 MG tablet    Sig: Take 1 tablet (800 mg total) by mouth every 8 (eight) hours as needed.    Dispense:  90 tablet    Refill:  5  . gabapentin (NEURONTIN) 300 MG capsule    Sig: Take 2 capsules (600 mg total) by mouth 3 (three) times daily.    Dispense:  180 capsule    Refill:  5    Please consider 90 day supplies to promote better adherence     Medication List        Accurate as of 12/28/16  4:56 PM. Always use your most recent med list.          albuterol 108 (90 Base) MCG/ACT inhaler Commonly known as:  PROVENTIL HFA;VENTOLIN HFA   * amiodarone 200 MG tablet Commonly known as:  PACERONE Take 2 tablets twice daily for two weeks then take 2 tablets once daily for two weeks.   * amiodarone 200 MG tablet Commonly known as:  PACERONE Take 1 tablet (200 mg total) daily by mouth.   amoxicillin-clavulanate 875-125 MG tablet Commonly known as:  AUGMENTIN   aspirin EC 81 MG tablet Take 1 tablet (81 mg total) daily by mouth.   carvedilol 6.25 MG tablet Commonly known as:  COREG Take 1 tablet (6.25 mg total) 2 (two) times daily by mouth.   clotrimazole 1 % cream Commonly known as:  LOTRIMIN   cyclobenzaprine 10 MG tablet Commonly known as:  FLEXERIL Take 1 tablet (10 mg total) by mouth  3 (three) times daily as needed. for muscle spams   dexlansoprazole 60 MG capsule Commonly known as:  DEXILANT   diclofenac sodium 1 % Gel Commonly known as:  VOLTAREN   docusate sodium 100 MG capsule Commonly known as:  COLACE   Fish Oil 3419 MG Caps   folic acid 1 MG tablet Commonly known as:  FOLVITE   furosemide 40 MG tablet Commonly known as:  LASIX   gabapentin 300 MG capsule Commonly known as:  NEURONTIN Take 2 capsules (600 mg total) by mouth 3 (three) times daily.   ibuprofen 800 MG tablet Commonly known as:  ADVIL,MOTRIN Take 1 tablet (800 mg total) by mouth every 8 (eight) hours as needed.   levocetirizine 5 MG tablet Commonly known as:   XYZAL   mirabegron ER 50 MG Tb24 tablet Commonly known as:  MYRBETRIQ   montelukast 10 MG tablet Commonly known as:  SINGULAIR   * MULTIVITAMIN GUMMIES ADULTS Chew   * ICAPS AREDS 2 Caps   nortriptyline 50 MG capsule Commonly known as:  PAMELOR   nystatin 100000 UNIT/ML suspension Commonly known as:  MYCOSTATIN   oxyCODONE-acetaminophen 7.5-325 MG tablet Commonly known as:  PERCOCET Take 1 tablet by mouth every 6 (six) hours as needed for severe pain.   potassium chloride SA 20 MEQ tablet Commonly known as:  K-DUR,KLOR-CON   predniSONE 5 MG tablet Commonly known as:  DELTASONE   PROBIOTIC COLON SUPPORT Caps   ramipril 10 MG capsule Commonly known as:  ALTACE   sucralfate 1 g tablet Commonly known as:  CARAFATE   tiotropium 18 MCG inhalation capsule Commonly known as:  SPIRIVA   Vitamin D (Ergocalciferol) 50000 units Caps capsule Commonly known as:  DRISDOL      * This list has 4 medication(s) that are the same as other medications prescribed for you. Read the directions carefully, and ask your doctor or other care provider to review them with you.          Where to Get Your Medications    These medications were sent to [facility], [place], [place] [address]   Phone:  [phone]   cyclobenzaprine 10 MG tablet  gabapentin 300 MG capsule  ibuprofen 800 MG tablet   You can get these medications from any pharmacy   Bring a paper prescription for each of these medications  oxyCODONE-acetaminophen 7.5-325 MG tablet    ----------------------------------------------------------------------------------------------------------------------  Follow-up: Return in about 2 months (around 02/28/2017) for evaluation, med refill.    Molli Barrows, MD

## 2016-12-28 NOTE — Progress Notes (Signed)
Nursing Pain Medication Assessment:  Safety precautions to be maintained throughout the outpatient stay will include: orient to surroundings, keep bed in low position, maintain call bell within reach at all times, provide assistance with transfer out of bed and ambulation.  Medication Inspection Compliance: Pill count conducted under aseptic conditions, in front of the patient. Neither the pills nor the bottle was removed from the patient's sight at any time. Once count was completed pills were immediately returned to the patient in their original bottle.  Medication: Oxycodone/APAP Pill/Patch Count: 61 of 120 pills remain Pill/Patch Appearance: Markings consistent with prescribed medication Bottle Appearance: Standard pharmacy container. Clearly labeled. Filled Date: 12/ 4 / 2018 Last Medication intake:  Today

## 2016-12-30 ENCOUNTER — Ambulatory Visit (INDEPENDENT_AMBULATORY_CARE_PROVIDER_SITE_OTHER): Payer: Medicare Other

## 2016-12-30 DIAGNOSIS — R002 Palpitations: Secondary | ICD-10-CM | POA: Diagnosis not present

## 2016-12-30 DIAGNOSIS — I493 Ventricular premature depolarization: Secondary | ICD-10-CM | POA: Diagnosis not present

## 2017-01-07 ENCOUNTER — Ambulatory Visit
Admission: RE | Admit: 2017-01-07 | Discharge: 2017-01-07 | Disposition: A | Discharge status: Home / Self Care | Payer: Medicare Other | Source: Ambulatory Visit | Attending: Internal Medicine | Admitting: Internal Medicine

## 2017-01-07 DIAGNOSIS — I493 Ventricular premature depolarization: Secondary | ICD-10-CM | POA: Diagnosis not present

## 2017-01-07 DIAGNOSIS — R002 Palpitations: Secondary | ICD-10-CM | POA: Diagnosis not present

## 2017-02-04 ENCOUNTER — Telehealth: Payer: Self-pay | Admitting: Internal Medicine

## 2017-02-04 NOTE — Telephone Encounter (Signed)
New Message   Wendy Sanchez from [facility] had requested a Holter monitor interpretation. Please disregard. No longer needed.

## 2017-02-11 ENCOUNTER — Ambulatory Visit: Payer: Medicare Other | Admitting: Internal Medicine

## 2017-02-11 ENCOUNTER — Encounter: Payer: Self-pay | Admitting: Internal Medicine

## 2017-02-11 VITALS — BP 120/90 | HR 83 | Ht 68.0 in | Wt 318.0 lb

## 2017-02-11 DIAGNOSIS — Z79899 Other long term (current) drug therapy: Secondary | ICD-10-CM | POA: Diagnosis not present

## 2017-02-11 DIAGNOSIS — I493 Ventricular premature depolarization: Secondary | ICD-10-CM | POA: Diagnosis not present

## 2017-02-11 DIAGNOSIS — I428 Other cardiomyopathies: Secondary | ICD-10-CM

## 2017-02-11 MED ORDER — SACUBITRIL-VALSARTAN 24-26 MG PO TABS: 1.0000 | ORAL_TABLET | Freq: Two times a day (BID) | ORAL | 11 refills | Status: DC

## 2017-02-11 NOTE — Progress Notes (Signed)
Patient Care Team: [name], MD as PCP - General (Family Medicine)   HPI  Wendy Sanchez is a 67 y.o. 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  Initially seen in consultation 11/18  amiodarone was initiated a few weeks ago.  Thus far she is tolerating it without difficulty   She saw Dr. Billey Co who stopped her ramipril with the intention of starting her on Entresto.  In the wake of that, she has had increasing problems with fluid retention shortness of breath ectopy orthopnea nocturnal dyspnea.  Yesterday she took an extra Lasix with improved urine output.  Her weight is up about 8 pounds  She has vague chest discomfort.  DATE TEST    6/18    Echo   EF 30 % LVE/LAE  8/18    Cath   EF 25 % Normal CAs        Date Cr K TSH LFTs Mg Hgb   9/18 0.8 4.5     12.8             Past Medical History:  Diagnosis Date  . Allergic rhinitis   . Arthritis   . Asthma    problems with fumes and aerosols cause asthma  . Complication of anesthesia    awakens during surgery; has occurred with last 3-4 surgeries   . Environmental allergies    fumes   . Fibromyalgia   . GERD (gastroesophageal reflux disease)   . History of bronchitis   . Hypertension   . Numbness    hands bilat when driving; improves when not preforming task   . Status post gastric banding   . Tinnitus    comes and goes   . Vertigo    6 months ago approx     Past Surgical History:  Procedure Laterality Date  . ABDOMINAL HYSTERECTOMY  1979   left ovary remains  . ABDOMINAL HYSTERECTOMY    . CHOLECYSTECTOMY    . fibrous tissue removed from right shoulder and back of neck      2 years ago   . GANGLION CYST EXCISION    . GASTRIC BYPASS  1980   states had bypass with banding and band left in -  . KNEE SURGERY Bilateral    both knees  2001  . RIGHT/LEFT HEART CATH AND CORONARY ANGIOGRAPHY Bilateral 08/12/2016   Procedure: Right/Left Heart Cath and Coronary Angiography;   Surgeon: Yolonda Kida, MD;  Location: [facility];  Service: Cardiovascular;  Laterality: Bilateral;  . TONSILLECTOMY     age 76  . TOTAL KNEE ARTHROPLASTY Left 08/02/2015   Procedure: LEFT TOTAL KNEE ARTHROPLASTY;  Surgeon: Mcarthur Rossetti, MD;  Location: [facility];  Service: Orthopedics;  Laterality: Left;  . TOTAL KNEE ARTHROPLASTY Right 12/13/2015   Procedure: RIGHT TOTAL KNEE ARTHROPLASTY;  Surgeon: Mcarthur Rossetti, MD;  Location: [facility];  Service: Orthopedics;  Laterality: Right;    Current Outpatient Medications  Medication Sig Dispense Refill  . albuterol (PROVENTIL HFA;VENTOLIN HFA) 108 (90 Base) MCG/ACT inhaler Inhale 2 puffs into the lungs every 6 (six) hours as needed for wheezing or shortness of breath.    Marland Kitchen amiodarone (PACERONE) 200 MG tablet Take 1 tablet (200 mg total) daily by mouth. 30 tablet 3  . aspirin EC 81 MG tablet Take 1 tablet (81 mg total) daily by mouth. 90 tablet 3  . carvedilol (COREG) 6.25 MG tablet Take 1 tablet (6.25 mg total)  2 (two) times daily by mouth. 180 tablet 3  . clotrimazole (LOTRIMIN) 1 % cream Apply 1 application topically 2 (two) times daily as needed (rash).     . cyclobenzaprine (FLEXERIL) 10 MG tablet Take 1 tablet (10 mg total) by mouth 3 (three) times daily as needed. for muscle spams 90 tablet 5  . dexlansoprazole (DEXILANT) 60 MG capsule Take 60 mg by mouth daily.    . diclofenac sodium (VOLTAREN) 1 % GEL Apply 2 g topically 4 (four) times daily as needed (pain).     Marland Kitchen docusate sodium (COLACE) 100 MG capsule Take 200 mg by mouth at bedtime.     . folic acid (FOLVITE) 1 MG tablet Take 1 mg by mouth at bedtime.     . furosemide (LASIX) 40 MG tablet Take 80 mg by mouth 2 (two) times daily as needed for fluid or edema.     . gabapentin (NEURONTIN) 300 MG capsule Take 2 capsules (600 mg total) by mouth 3 (three) times daily. 180 capsule 5  . ibuprofen (ADVIL,MOTRIN) 800 MG tablet Take 1 tablet (800 mg total) by mouth every  8 (eight) hours as needed. 90 tablet 5  . levocetirizine (XYZAL) 5 MG tablet Take 5 mg by mouth at bedtime.     . mirabegron ER (MYRBETRIQ) 50 MG TB24 tablet Take 50 mg by mouth daily.    . montelukast (SINGULAIR) 10 MG tablet Take 10 mg by mouth at bedtime.    . Multiple Vitamins-Minerals (ICAPS AREDS 2) CAPS Take 1 capsule by mouth 2 (two) times daily.     . Multiple Vitamins-Minerals (MULTIVITAMIN GUMMIES ADULTS) CHEW Chew 2 tablets by mouth daily.     . nortriptyline (PAMELOR) 50 MG capsule Take 50 mg by mouth at bedtime.    Marland Kitchen nystatin (MYCOSTATIN) 100000 UNIT/ML suspension     . Omega-3 Fatty Acids (FISH OIL) 1000 MG CAPS Take 1000mg  every 3rd day    . oxyCODONE-acetaminophen (PERCOCET) 7.5-325 MG tablet Take 1 tablet by mouth every 6 (six) hours as needed for severe pain. 120 tablet 0  . potassium chloride SA (K-DUR,KLOR-CON) 20 MEQ tablet Take 20 mEq by mouth every other day. Take 20 meq every third day    . Probiotic Product (PROBIOTIC COLON SUPPORT) CAPS Take 1 capsule by mouth at bedtime.    . sucralfate (CARAFATE) 1 G tablet Take 1 g by mouth 2 (two) times daily as needed (esophageal burning).     Marland Kitchen tiotropium (SPIRIVA) 18 MCG inhalation capsule Place into inhaler and inhale.    . Vitamin D, Ergocalciferol, (DRISDOL) 50000 UNITS CAPS capsule Take 50,000 Units by mouth every 7 (seven) days. thursdays     No current facility-administered medications for this visit.     Allergies  Allergen Reactions  . Hydrocodone-Acetaminophen Swelling  . Iodine Anaphylaxis and Swelling  . Other     ALLERGY TO METAL - BLACKENS SKIN AND CAUSES A RASH   . Tape Swelling    Skin comes off.  Paper tape is ok  . Shellfish Allergy Nausea And Vomiting    Episode of GI infection after eating clam chowder. Still eats shrimp and other seafood      Review of Systems negative except from HPI and PMH  Physical Exam BP 120/90 (BP Location: Left Arm, Patient Position: Sitting, Cuff Size: Large)   Pulse  83   Ht 5\' 8"  (1.727 m)   Wt (!) 318 lb (144.2 kg)   BMI 48.35 kg/m  Well developed and  Morbidly obese in no acute distress HENT normal Neck supple with JVP-flat Carotids brisk and full without bruits Clear Regular rate and rhythm, no murmurs or gallops Abd-soft with active BS without hepatomegaly No Clubbing cyanosis 2+ edema Skin-warm and dry A & Oriented  Grossly normal sensory and motor function   ECG sinus at 83 Intervals 19/09/40 Frequent PVCs with 2 distinct morphologies 1 is left bundle-inferior axis the other is right bundle superior axis      Assessment and Plan:  Nonischemic cardiomyopathy  PVCs-- 2 dominant morphologies    Diastolic pressure equalization catheterization  Morbidly obese  Acute on chronic systolic heart failure    She is volume overloaded  Will increase her diuretics to 80/40 x 3 d then 40 bid x 3 d then back to baseline  This may be responsible for the clinically worsening PVC and so will wait to reassess burden  In addition,, would agree with Dr DC tabout the use of entresto and will begin  There has also been demonstrated antiarrhythmic properties so this may also help PVC  wlll plan holter in about 4 weeks and determine timing of echo thereafter  Will arrange followup in 2 weeks with reassessment and will also need amio labs-- never done ??  We spent more than 50% of our >25 min visit in face to face counseling regarding the above          Current medicines are reviewed at length with the patient today .  The patient does not have concerns regarding medicines.

## 2017-02-11 NOTE — Patient Instructions (Addendum)
Medication Instructions: - Your physician has recommended you make the following change in your medication:  1) START Entresto 24/26 mg- take 1 tablet by mouth TWICE daily  2) INCREASE lasix (furosemide) 40 mg as below: - take 2 tablets (80 mg) in the morning & 1 tablet (40 mg) in the afternoon x 3 days,  - then take 1 tablet (40 mg) in the monring & 1 tablet (40 mg) in the afternoon x 3 days,  - then resume 1 tablet (40 mg) once daily, you may take an extra tablet (40 mg) daily as needed for weight gain of 3 lbs/ more in 24 hours, increased shortness of breath, or increased swelling   Labwork: - none ordered (when you come back in 2 weeks, we will check lab work at that time)  Procedures/Testing: - Your physician has recommended that you wear a 48 hour holter monitor. Holter monitors are medical devices that record the heart's electrical activity. Doctors most often use these monitors to diagnose arrhythmias. Arrhythmias are problems with the speed or rhythm of the heartbeat. The monitor is a small, portable device. You can wear one while you do your normal daily activities. This is usually used to diagnose what is causing palpitations/syncope (passing out).  Follow-Up: - Your physician recommends that you schedule a follow-up appointment in: 2 weeks with Ignacia Bayley, NP/ Christell Faith, PA   - Your physician recommends that you schedule a follow-up appointment in: 12 weeks with Dr. Caryl Comes.   Any Additional Special Instructions Will Be Listed Below (If Applicable).     If you need a refill on your cardiac medications before your next appointment, please call your pharmacy.

## 2017-02-15 ENCOUNTER — Ambulatory Visit (INDEPENDENT_AMBULATORY_CARE_PROVIDER_SITE_OTHER): Payer: Medicare Other

## 2017-02-15 DIAGNOSIS — I493 Ventricular premature depolarization: Secondary | ICD-10-CM

## 2017-02-22 ENCOUNTER — Ambulatory Visit
Admission: RE | Admit: 2017-02-22 | Discharge: 2017-02-22 | Disposition: A | Discharge status: Home / Self Care | Payer: Medicare Other | Source: Ambulatory Visit | Attending: Internal Medicine | Admitting: Internal Medicine

## 2017-02-22 DIAGNOSIS — I5023 Acute on chronic systolic (congestive) heart failure: Secondary | ICD-10-CM | POA: Insufficient documentation

## 2017-02-22 DIAGNOSIS — I428 Other cardiomyopathies: Secondary | ICD-10-CM | POA: Insufficient documentation

## 2017-02-25 ENCOUNTER — Ambulatory Visit (INDEPENDENT_AMBULATORY_CARE_PROVIDER_SITE_OTHER): Payer: Medicare Other | Admitting: Nurse Practitioner

## 2017-02-25 ENCOUNTER — Encounter: Payer: Self-pay | Admitting: Nurse Practitioner

## 2017-02-25 VITALS — BP 100/60 | HR 75 | Ht 67.0 in | Wt 310.5 lb

## 2017-02-25 DIAGNOSIS — Z79899 Other long term (current) drug therapy: Secondary | ICD-10-CM

## 2017-02-25 DIAGNOSIS — I5022 Chronic systolic (congestive) heart failure: Secondary | ICD-10-CM

## 2017-02-25 DIAGNOSIS — I428 Other cardiomyopathies: Secondary | ICD-10-CM

## 2017-02-25 DIAGNOSIS — I493 Ventricular premature depolarization: Secondary | ICD-10-CM | POA: Diagnosis not present

## 2017-02-25 MED ORDER — RANOLAZINE ER 500 MG PO TB12: 500.0000 mg | ORAL_TABLET | Freq: Two times a day (BID) | ORAL | 6 refills | Status: DC

## 2017-02-25 NOTE — Patient Instructions (Addendum)
Medication Instructions:  Your physician has recommended you make the following change in your medication:  1. START Ranexa 500 mg twice a day Medication Samples have been provided to the patient.  Drug name: Ranexa       Strength: 500 mg        Qty: 4 boxes  LOT: [id number]  Exp.Date: 09/20   Labwork: CMET, TSH today  Testing/Procedures: In one month (03/25/17), your physician has recommended that you wear a holter monitor. Holter monitors are medical devices that record the heart's electrical activity. Doctors most often use these monitors to diagnose arrhythmias. Arrhythmias are problems with the speed or rhythm of the heartbeat. The monitor is a small, portable device. You can wear one while you do your normal daily activities. This is usually used to diagnose what is causing palpitations/syncope (passing out).    Follow-Up: Your physician recommends that you schedule a follow-up appointment in: 2 weeks after monitor is completed we would like you to follow up with Dr. Caryl Comes in our office.   It was a pleasure seeing you today here in the office. Please do not hesitate to give [place] a call back if you have any further questions. [place], BSN

## 2017-02-25 NOTE — Progress Notes (Signed)
Office Visit    Patient Name: Wendy Sanchez Date of Encounter: 02/25/2017  Primary Care Provider:  [name], MD Primary Cardiologist: D. Clayborn Bigness, MD; EP:  Virl Axe, MD  Chief Complaint    67 year old female with a history of nonischemic cardiomyopathy, HFrEF, PVCs (2 morphologies), morbid obesity, hypertension, GERD, and fibromyalgia, who presents for follow-up related to PVCs and heart failure.  Past Medical History    Past Medical History:  Diagnosis Date  . Allergic rhinitis   . Arthritis   . Asthma    problems with fumes and aerosols cause asthma  . Complication of anesthesia    awakens during surgery; has occurred with last 3-4 surgeries   . Environmental allergies    fumes   . Fibromyalgia   . GERD (gastroesophageal reflux disease)   . History of bronchitis   . Hypertension   . NICM (nonischemic cardiomyopathy) ([place])    a. ? PVC mediated;  b. 06/2016 Echo: EF 25-30%, mild LVH, mild LAE, mild AI/MR/TR/PR; c. 08/2016 Cath: nl cors, EF 25%.  . Numbness    hands bilat when driving; improves when not preforming task   . PVC's (premature ventricular contractions)    a. 11/2016 Amio started;  b. 11/29/16 48h Holter: 71696 PVC's (41%); c. 12/2016 24h Holter: 18040 PVC's (15%); c. 02/2017 48h Holter: 37262 PVC's (41%).  . Status post gastric banding   . Tinnitus    comes and goes   . Vertigo    6 months ago approx    Past Surgical History:  Procedure Laterality Date  . ABDOMINAL HYSTERECTOMY  1979   left ovary remains  . ABDOMINAL HYSTERECTOMY    . CHOLECYSTECTOMY    . fibrous tissue removed from right shoulder and back of neck      2 years ago   . GANGLION CYST EXCISION    . GASTRIC BYPASS  1980   states had bypass with banding and band left in -  . KNEE SURGERY Bilateral    both knees  2001  . RIGHT/LEFT HEART CATH AND CORONARY ANGIOGRAPHY Bilateral 08/12/2016   Procedure: Right/Left Heart Cath and Coronary Angiography;  Surgeon: Yolonda Kida,  MD;  Location: [facility];  Service: Cardiovascular;  Laterality: Bilateral;  . TONSILLECTOMY     age 67  . TOTAL KNEE ARTHROPLASTY Left 08/02/2015   Procedure: LEFT TOTAL KNEE ARTHROPLASTY;  Surgeon: Mcarthur Rossetti, MD;  Location: [facility];  Service: Orthopedics;  Laterality: Left;  . TOTAL KNEE ARTHROPLASTY Right 12/13/2015   Procedure: RIGHT TOTAL KNEE ARTHROPLASTY;  Surgeon: Mcarthur Rossetti, MD;  Location: [facility];  Service: Orthopedics;  Laterality: Right;    Allergies  Allergies  Allergen Reactions  . Hydrocodone-Acetaminophen Swelling  . Iodine Anaphylaxis and Swelling  . Other     ALLERGY TO METAL - BLACKENS SKIN AND CAUSES A RASH   . Tape Swelling    Skin comes off.  Paper tape is ok  . Shellfish Allergy Nausea And Vomiting    Episode of GI infection after eating clam chowder. Still eats shrimp and other seafood    History of Present Illness    67 year old female with the above complex past medical history including nonischemic cardiomyopathy, hypertension, obesity, HFrEF, GERD, fibromyalgia, and frequent PVCs.  She was evaluated over the summer 2018 with finding of LV dysfunction and an EF of 25-30% by echocardiogram.  This was followed by diagnostic catheterization which showed normal coronary arteries and an EF of  25%.  There was some suggestion for a restrictive process with an RA pressure of 19, RV diastolic of 16, and LV diastolic of 16.  In the setting of frequent PVCs and cardia myopathy, she was referred to Dr. Caryl Comes who started amiodarone therapy in early November.  Follow-up Holter monitoring in early November showed a 41% PVC burden.  Repeat 24-hour Holter and late December showed a 15% PVC burden.  When she was most recently seen back in clinic at the end of January, she was volume overloaded and her Lasix dose was increased.  She was also switched over to [facility].  A Holter monitor was placed and this showed a 17% PVC burden with a total of 37,262  PVCs over 48 hours.  She presents for follow-up today.  Since her last visit, she says she has had improvement in volume status.  She is down 8 pounds.  Breathing is much easier and she has significantly less lower extremity swelling.  She denies PND, orthopnea, dizziness, syncope, chest pain, or early satiety.  She is not aware of her PVC burden.  She reports compliance with medications and also avoidance of processed foods.  She does not add salt to food.  She is doing her best to eat fresh foods.  She keeps her legs elevated during the day when sitting.  Home Medications    Prior to Admission medications   Medication Sig Start Date End Date Taking? Authorizing Provider  albuterol (PROVENTIL HFA;VENTOLIN HFA) 108 (90 Base) MCG/ACT inhaler Inhale 2 puffs into the lungs every 6 (six) hours as needed for wheezing or shortness of breath.   Yes [provider]  amiodarone (PACERONE) 200 MG tablet Take 1 tablet (200 mg total) daily by mouth. 12/18/16  Yes Deboraha Sprang, MD  aspirin EC 81 MG tablet Take 1 tablet (81 mg total) daily by mouth. 11/18/16  Yes Deboraha Sprang, MD  carvedilol (COREG) 6.25 MG tablet Take 1 tablet (6.25 mg total) 2 (two) times daily by mouth. 11/18/16  Yes Deboraha Sprang, MD  clotrimazole (LOTRIMIN) 1 % cream Apply 1 application topically 2 (two) times daily as needed (rash).    Yes [provider]  cyclobenzaprine (FLEXERIL) 10 MG tablet Take 1 tablet (10 mg total) by mouth 3 (three) times daily as needed. for muscle spams 12/28/16  Yes Molli Barrows, MD  dexlansoprazole (DEXILANT) 60 MG capsule Take 60 mg by mouth daily.   Yes [provider]  diclofenac sodium (VOLTAREN) 1 % GEL Apply 2 g topically 4 (four) times daily as needed (pain).    Yes [provider]  diphenhydrAMINE (BENADRYL) 25 mg capsule Take 25 mg by mouth every 8 (eight) hours as needed.   Yes [provider]  docusate sodium (COLACE) 100 MG capsule Take 200 mg by  mouth at bedtime.    Yes [provider]  folic acid (FOLVITE) 1 MG tablet Take 1 mg by mouth at bedtime.    Yes [provider]  furosemide (LASIX) 40 MG tablet Take 40 mg by mouth 2 (two) times daily    Yes [provider]  gabapentin (NEURONTIN) 300 MG capsule Take 2 capsules (600 mg total) by mouth 3 (three) times daily. 12/28/16  Yes Molli Barrows, MD  ibuprofen (ADVIL,MOTRIN) 800 MG tablet Take 1 tablet (800 mg total) by mouth every 8 (eight) hours as needed. 12/28/16  Yes Molli Barrows, MD  levocetirizine (XYZAL) 5 MG tablet Take 5 mg by  mouth at bedtime.    Yes [provider]  mirabegron ER (MYRBETRIQ) 50 MG TB24 tablet Take 50 mg by mouth daily.   Yes [provider]  montelukast (SINGULAIR) 10 MG tablet Take 10 mg by mouth at bedtime.   Yes [provider]  Multiple Vitamins-Minerals (ICAPS AREDS 2) CAPS Take 1 capsule by mouth 2 (two) times daily.    Yes [provider]  Multiple Vitamins-Minerals (MULTIVITAMIN GUMMIES ADULTS) CHEW Chew 2 tablets by mouth daily.    Yes [provider]  nortriptyline (PAMELOR) 50 MG capsule Take 50 mg by mouth at bedtime.   Yes [provider]  nystatin (MYCOSTATIN) 100000 UNIT/ML suspension  12/24/16  Yes [provider]  Omega-3 Fatty Acids (FISH OIL) 1000 MG CAPS Take 1000mg  every 3rd day   Yes [provider]  oxyCODONE-acetaminophen (PERCOCET) 7.5-325 MG tablet Take 1 tablet by mouth every 6 (six) hours as needed for severe pain. 12/28/16  Yes Molli Barrows, MD  potassium chloride SA (K-DUR,KLOR-CON) 20 MEQ tablet Take 20 mEq by mouth every other day. Take 20 meq every third day   Yes [provider]  Probiotic Product (PROBIOTIC COLON SUPPORT) CAPS Take 1 capsule by mouth at bedtime.   Yes [provider]  sacubitril-valsartan (ENTRESTO) 24-26 MG Take 1 tablet by mouth 2 (two) times daily. 02/11/17  Yes Deboraha Sprang, MD    sucralfate (CARAFATE) 1 G tablet Take 1 g by mouth 2 (two) times daily as needed (esophageal burning).    Yes [provider]  tiotropium (SPIRIVA) 18 MCG inhalation capsule Place into inhaler and inhale. 09/04/16 09/04/17 Yes [provider]  Vitamin D, Ergocalciferol, (DRISDOL) 50000 UNITS CAPS capsule Take 50,000 Units by mouth every 7 (seven) days. thursdays   Yes [provider]  ranolazine (RANEXA) 500 MG 12 hr tablet Take 1 tablet (500 mg total) by mouth 2 (two) times daily. 02/25/17   Theora Gianotti, NP    Review of Systems    As above, she still has some mild volume overload with lower extremity edema but has had significant improvement in baseline level of dyspnea.  She denies chest pain, palpitations, PND, orthopnea, dizziness, syncope, or early satiety..  All other systems reviewed and are otherwise negative except as noted above.  Physical Exam    VS:  BP 100/60 (BP Location: Left Wrist, Patient Position: Sitting, Cuff Size: Normal)   Pulse 75   Ht 5\' 7"  (1.702 m)   Wt (!) 310 lb 8 oz (140.8 kg)   BMI 48.63 kg/m  , BMI Body mass index is 48.63 kg/m. GEN: Obese, in no acute distress.  HEENT: normal.  Neck: Supple, obese, difficult to gauge JVP.  No carotid bruits, or masses. Cardiac: RRR, no murmurs, rubs, or gallops. No clubbing, cyanosis, trace to 1+ bilateral ankle edema.  Radials/DP/PT 2+ and equal bilaterally.  Respiratory:  Respirations regular and unlabored, clear to auscultation bilaterally. GI: Soft, nontender, nondistended, BS + x 4. MS: no deformity or atrophy. Skin: warm and dry, no rash. Neuro:  Strength and sensation are intact. Psych: Normal affect.  Accessory Clinical Findings    ECG -regular sinus rhythm, 75, PVC, biatrial enlargement, inferior infarct, anterolateral T wave inversion.  No acute changes.  Assessment & Plan    1.  Nonischemic cardiomyopathy/HFrEF: EF 25-30%.  At last follow-up visit 2 weeks ago, she  was volume overloaded.  Lasix dose was increased times 3 days and then dropped back down  to 40 mg twice daily. She has had significant improvement in 8 pound weight loss.  She has only mild lower extremity edema today.  She has been more functional over the past 2 weeks.  I will keep her Lasix dose where it is at and also continue current doses of beta-blocker and Entresto.  Blood pressure is soft and I do not think there is room to add a mineralocorticoid receptor antagonist today.  Follow-up complete metabolic panel today to ensure that she is tolerating Entresto.  2.  PVCs: Unclear role in cardiomyopathy at this point.  I did discuss her case with Dr. Caryl Comes today.  Recent Holter monitor showed an increased burden of PVCs since her Holter in December.  Currently at 41% of total.  Continue amiodarone 200 mg daily and follow-up TSH and liver function test today.  I am also going to add Ranexa 500 mg twice daily.  We will plan on replacing a 48-hour Holter monitor in approximately 4 weeks to reassess PVC burden.  Follow-up with Dr. Caryl Comes in approximately 6 weeks.  3.  Essential hypertension: This is well managed on beta-blocker, Entresto, and diuretic therapy.  4.  Morbid obesity: Status post remote gastric banding.  She remains obese.  Activity is very limited.  5.  Disposition: Follow-up complete metabolic panel and TSH today.  Follow-up Holter monitor in 1 month.  Follow-up with Dr. Caryl Comes in 6 weeks or sooner if necessary.   Murray Hodgkins, NP 02/25/2017, 2:11 PM

## 2017-02-26 LAB — COMPREHENSIVE METABOLIC PANEL
A/G RATIO: 1.2 (ref 1.2–2.2)
ALBUMIN: 3.7 g/dL (ref 3.6–4.8)
ALK PHOS: 83 IU/L (ref 39–117)
ALT: 11 IU/L (ref 0–32)
AST: 13 IU/L (ref 0–40)
BUN / CREAT RATIO: 14 (ref 12–28)
BUN: 15 mg/dL (ref 8–27)
Bilirubin Total: 0.2 mg/dL (ref 0.0–1.2)
CO2: 24 mmol/L (ref 20–29)
CREATININE: 1.1 mg/dL — AB (ref 0.57–1.00)
Calcium: 9.5 mg/dL (ref 8.7–10.3)
Chloride: 105 mmol/L (ref 96–106)
GFR calc Af Amer: 61 mL/min/{1.73_m2} (ref >59)
GFR calc non Af Amer: 53 mL/min/{1.73_m2} — ABNORMAL LOW (ref >59)
GLOBULIN, TOTAL: 3.2 g/dL (ref 1.5–4.5)
Glucose: 106 mg/dL — ABNORMAL HIGH (ref 65–99)
POTASSIUM: 5 mmol/L (ref 3.5–5.2)
SODIUM: 145 mmol/L — AB (ref 134–144)
Total Protein: 6.9 g/dL (ref 6.0–8.5)

## 2017-02-26 LAB — TSH: TSH: 1.82 u[IU]/mL (ref 0.450–4.500)

## 2017-03-09 ENCOUNTER — Ambulatory Visit: Payer: Medicare Other | Attending: Anesthesiology | Admitting: Anesthesiology

## 2017-03-09 ENCOUNTER — Other Ambulatory Visit: Payer: Self-pay

## 2017-03-09 ENCOUNTER — Encounter: Payer: Self-pay | Admitting: Anesthesiology

## 2017-03-09 VITALS — BP 100/76 | HR 77 | Temp 98.0°F | Resp 16 | Ht 67.0 in | Wt 307.0 lb

## 2017-03-09 DIAGNOSIS — M25511 Pain in right shoulder: Secondary | ICD-10-CM | POA: Diagnosis present

## 2017-03-09 DIAGNOSIS — Z79899 Other long term (current) drug therapy: Secondary | ICD-10-CM | POA: Diagnosis not present

## 2017-03-09 DIAGNOSIS — M25551 Pain in right hip: Secondary | ICD-10-CM | POA: Insufficient documentation

## 2017-03-09 DIAGNOSIS — Z79891 Long term (current) use of opiate analgesic: Secondary | ICD-10-CM | POA: Diagnosis not present

## 2017-03-09 DIAGNOSIS — G8929 Other chronic pain: Secondary | ICD-10-CM | POA: Diagnosis not present

## 2017-03-09 DIAGNOSIS — G894 Chronic pain syndrome: Secondary | ICD-10-CM | POA: Insufficient documentation

## 2017-03-09 DIAGNOSIS — F1190 Opioid use, unspecified, uncomplicated: Secondary | ICD-10-CM

## 2017-03-09 DIAGNOSIS — M48062 Spinal stenosis, lumbar region with neurogenic claudication: Secondary | ICD-10-CM | POA: Diagnosis not present

## 2017-03-09 DIAGNOSIS — Z7982 Long term (current) use of aspirin: Secondary | ICD-10-CM | POA: Diagnosis not present

## 2017-03-09 DIAGNOSIS — M19011 Primary osteoarthritis, right shoulder: Secondary | ICD-10-CM | POA: Insufficient documentation

## 2017-03-09 DIAGNOSIS — M1712 Unilateral primary osteoarthritis, left knee: Secondary | ICD-10-CM | POA: Insufficient documentation

## 2017-03-09 DIAGNOSIS — M25512 Pain in left shoulder: Secondary | ICD-10-CM | POA: Diagnosis present

## 2017-03-09 MED ORDER — OXYCODONE-ACETAMINOPHEN 7.5-325 MG PO TABS: 1.0000 | ORAL_TABLET | Freq: Four times a day (QID) | ORAL | 0 refills | Status: DC | PRN

## 2017-03-09 NOTE — Patient Instructions (Signed)
You have been given 2 scripts for percocet today.

## 2017-03-09 NOTE — Progress Notes (Signed)
Nursing Pain Medication Assessment:  Safety precautions to be maintained throughout the outpatient stay will include: orient to surroundings, keep bed in low position, maintain call bell within reach at all times, provide assistance with transfer out of bed and ambulation.  Medication Inspection Compliance: Pill count conducted under aseptic conditions, in front of the patient. Neither the pills nor the bottle was removed from the patient's sight at any time. Once count was completed pills were immediately returned to the patient in their original bottle.  Medication: Oxycodone/APAP Pill/Patch Count: 27 of 120 pills remain Pill/Patch Appearance: Markings consistent with prescribed medication Bottle Appearance: Standard pharmacy container. Clearly labeled. Filled Date: 2 / 3 / 2019 Last Medication intake:  Today

## 2017-03-09 NOTE — Progress Notes (Signed)
Subjective:  Patient ID: Wendy Sanchez, female    DOB: 1950-07-25  Age: 67 y.o. MRN: [id number]  CC: Shoulder Pain (bilaterally); Wrist Pain (left); and Foot Pain (right, ankle and top of foot)   Procedure: None  HPI Wendy Sanchez presents for reevaluation.  She was last seen 2 months ago and has been doing reasonably well other than her diffuse body pain which has been stable in nature.  For this, she takes her oxycodone.  This seems to be working well based on her narcotic assessment sheet.  Based on our review today she continues to derive good functional lifestyle improvement with the medications.  It is not complete relief but significantly better than conservative therapy has been managed for her in the past.  She takes her medications without significant side effects other than occasional constipation.  Quality characteristic distribution of her pain remains stable in nature.  This primarily affects her knees hips lower back and occasionally her neck.  The pain is described as a persistent and aching unless she is taking her oxycodone medications.  Outpatient Medications Prior to Visit  Medication Sig Dispense Refill  . albuterol (PROVENTIL HFA;VENTOLIN HFA) 108 (90 Base) MCG/ACT inhaler Inhale 2 puffs into the lungs every 6 (six) hours as needed for wheezing or shortness of breath.    Marland Kitchen amiodarone (PACERONE) 200 MG tablet Take 1 tablet (200 mg total) daily by mouth. 30 tablet 3  . aspirin EC 81 MG tablet Take 1 tablet (81 mg total) daily by mouth. 90 tablet 3  . carvedilol (COREG) 6.25 MG tablet Take 1 tablet (6.25 mg total) 2 (two) times daily by mouth. 180 tablet 3  . clotrimazole (LOTRIMIN) 1 % cream Apply 1 application topically 2 (two) times daily as needed (rash).     . cyclobenzaprine (FLEXERIL) 10 MG tablet Take 1 tablet (10 mg total) by mouth 3 (three) times daily as needed. for muscle spams 90 tablet 5  . dexlansoprazole (DEXILANT) 60 MG capsule Take 60 mg by mouth daily.     . diclofenac sodium (VOLTAREN) 1 % GEL Apply 2 g topically 4 (four) times daily as needed (pain).     Marland Kitchen diphenhydrAMINE (BENADRYL) 25 mg capsule Take 25 mg by mouth every 8 (eight) hours as needed.    . docusate sodium (COLACE) 100 MG capsule Take 200 mg by mouth at bedtime.     . folic acid (FOLVITE) 1 MG tablet Take 1 mg by mouth at bedtime.     . furosemide (LASIX) 40 MG tablet Take 80 mg by mouth 2 (two) times daily as needed for fluid or edema.     . gabapentin (NEURONTIN) 300 MG capsule Take 2 capsules (600 mg total) by mouth 3 (three) times daily. 180 capsule 5  . ibuprofen (ADVIL,MOTRIN) 800 MG tablet Take 1 tablet (800 mg total) by mouth every 8 (eight) hours as needed. 90 tablet 5  . levocetirizine (XYZAL) 5 MG tablet Take 5 mg by mouth at bedtime.     . mirabegron ER (MYRBETRIQ) 50 MG TB24 tablet Take 50 mg by mouth daily.    . montelukast (SINGULAIR) 10 MG tablet Take 10 mg by mouth at bedtime.    . Multiple Vitamins-Minerals (ICAPS AREDS 2) CAPS Take 1 capsule by mouth 2 (two) times daily.     . Multiple Vitamins-Minerals (MULTIVITAMIN GUMMIES ADULTS) CHEW Chew 2 tablets by mouth daily.     . nortriptyline (PAMELOR) 50 MG capsule Take 50 mg by mouth  at bedtime.    Marland Kitchen nystatin (MYCOSTATIN) 100000 UNIT/ML suspension     . Omega-3 Fatty Acids (FISH OIL) 1000 MG CAPS Take 1000mg  every 3rd day    . potassium chloride SA (K-DUR,KLOR-CON) 20 MEQ tablet Take 20 mEq by mouth every other day. Take 20 meq every third day    . Probiotic Product (PROBIOTIC COLON SUPPORT) CAPS Take 1 capsule by mouth at bedtime.    . ranolazine (RANEXA) 500 MG 12 hr tablet Take 1 tablet (500 mg total) by mouth 2 (two) times daily. 60 tablet 6  . sacubitril-valsartan (ENTRESTO) 24-26 MG Take 1 tablet by mouth 2 (two) times daily. 60 tablet 11  . sucralfate (CARAFATE) 1 G tablet Take 1 g by mouth 2 (two) times daily as needed (esophageal burning).     Marland Kitchen tiotropium (SPIRIVA) 18 MCG inhalation capsule Place into  inhaler and inhale.    . Vitamin D, Ergocalciferol, (DRISDOL) 50000 UNITS CAPS capsule Take 50,000 Units by mouth every 7 (seven) days. thursdays    . oxyCODONE-acetaminophen (PERCOCET) 7.5-325 MG tablet Take 1 tablet by mouth every 6 (six) hours as needed for severe pain. 120 tablet 0   No facility-administered medications prior to visit.     Review of Systems CNS: No confusion or sedation Cardiac: No angina or palpitations GI: No abdominal pain or constipation Constitutional: No nausea vomiting fevers or chills  Objective:  BP 100/76   Pulse 77   Temp 98 F (36.7 C) (Oral)   Resp 16   Ht 5\' 7"  (1.702 m)   Wt (!) 307 lb (139.3 kg)   SpO2 100%   BMI 48.08 kg/m    BP Readings from Last 3 Encounters:  03/09/17 100/76  02/25/17 100/60  02/11/17 120/90     Wt Readings from Last 3 Encounters:  03/09/17 (!) 307 lb (139.3 kg)  02/25/17 (!) 310 lb 8 oz (140.8 kg)  02/11/17 (!) 318 lb (144.2 kg)     Physical Exam Pt is alert and oriented PERRL EOMI HEART IS RRR no murmur or rub LCTA no wheezing or rales MUSCULOSKELETAL reveals some paraspinous muscle tenderness in the lumbar region.  This is less pronounced than in the past.  She is still having significant pain about the bilateral knees and hips.  Some tenderness in the posterior neck as well.  Labs  No results found for: HGBA1C Lab Results  Component Value Date   LDLCALC (H) 03/16/2007    150        Total Cholesterol/HDL:CHD Risk Coronary Heart Disease Risk Table                     Men   Women  1/2 Average Risk   3.4   3.3   CREATININE 1.10 (H) 02/25/2017    -------------------------------------------------------------------------------------------------------------------- Lab Results  Component Value Date   WBC 4.0 10/19/2016   HGB 13.0 10/19/2016   HCT 39.9 10/19/2016   PLT 262 10/19/2016   GLUCOSE 106 (H) 02/25/2017   CHOL (H) 03/16/2007    236        ATP III CLASSIFICATION:  <200     mg/dL    Desirable  200-239  mg/dL   Borderline High  >=240    mg/dL   High   TRIG 76 03/16/2007   HDL 71 03/16/2007   LDLCALC (H) 03/16/2007    150        Total Cholesterol/HDL:CHD Risk Coronary Heart Disease Risk Table  Men   Women  1/2 Average Risk   3.4   3.3   ALT 11 02/25/2017   AST 13 02/25/2017   NA 145 (H) 02/25/2017   K 5.0 02/25/2017   CL 105 02/25/2017   CREATININE 1.10 (H) 02/25/2017   BUN 15 02/25/2017   CO2 24 02/25/2017   TSH 1.820 02/25/2017   INR 1.11 07/26/2015    --------------------------------------------------------------------------------------------------------------------- No results found.   Assessment & Plan:   Anjani was seen today for shoulder pain, wrist pain and foot pain.  Diagnoses and all orders for this visit:  Chronic pain syndrome  Chronic, continuous use of opioids -     Discontinue: oxyCODONE-acetaminophen (PERCOCET) 7.5-325 MG tablet; Take 1 tablet by mouth every 6 (six) hours as needed for severe pain. -     oxyCODONE-acetaminophen (PERCOCET) 7.5-325 MG tablet; Take 1 tablet by mouth every 6 (six) hours as needed for severe pain. -     ToxASSURE Select 1 (MW), Urine  Primary osteoarthritis of right shoulder  Chronic pain of right hip  Spinal stenosis of lumbar region with neurogenic claudication  Primary osteoarthritis of left knee        ----------------------------------------------------------------------------------------------------------------------  Problem List Items Addressed This Visit      Unprioritized   Chronic pain syndrome - Primary   Osteoarthritis of left knee   Relevant Medications   oxyCODONE-acetaminophen (PERCOCET) 7.5-325 MG tablet   Spinal stenosis of lumbar region    Other Visit Diagnoses    Chronic, continuous use of opioids       Relevant Medications   oxyCODONE-acetaminophen (PERCOCET) 7.5-325 MG tablet   Other Relevant Orders   ToxASSURE Select 13 (MW), Urine    Primary osteoarthritis of right shoulder       Relevant Medications   oxyCODONE-acetaminophen (PERCOCET) 7.5-325 MG tablet   Chronic pain of right hip       Relevant Medications   oxyCODONE-acetaminophen (PERCOCET) 7.5-325 MG tablet        ----------------------------------------------------------------------------------------------------------------------  1. Chronic pain syndrome Continue current medications with refills given for March 3 and April 2 for Percocet 7.5 mg tablets.  We have also requested a urine drug screen as she is due for this.  2. Chronic, continuous use of opioids We have reviewed the [facility] practitioner database information and it is appropriate. - oxyCODONE-acetaminophen (PERCOCET) 7.5-325 MG tablet; Take 1 tablet by mouth every 6 (six) hours as needed for severe pain.  Dispense: 120 tablet; Refill: 0 - ToxASSURE Select 13 (MW), Urine  3. Primary osteoarthritis of right shoulder As above  4. Chronic pain of right hip   5. Spinal stenosis of lumbar region with neurogenic claudication   6. Primary osteoarthritis of left knee Continue current exercise regimen with return to clinic in 2 months with persistent efforts at weight loss as once again reviewed today.    ----------------------------------------------------------------------------------------------------------------------  I am having [name] T. Ganas maintain her dexlansoprazole, sucralfate, montelukast, furosemide, diclofenac sodium, folic acid, mirabegron ER, potassium chloride SA, Vitamin D (Ergocalciferol), clotrimazole, PROBIOTIC COLON SUPPORT, MULTIVITAMIN GUMMIES ADULTS, nortriptyline, levocetirizine, ICAPS AREDS 2, docusate sodium, albuterol, Fish Oil, tiotropium, aspirin EC, carvedilol, amiodarone, nystatin, cyclobenzaprine, ibuprofen, gabapentin, sacubitril-valsartan, diphenhydrAMINE, ranolazine, and oxyCODONE-acetaminophen.   Meds ordered this encounter  Medications  .  DISCONTD: oxyCODONE-acetaminophen (PERCOCET) 7.5-325 MG tablet    Sig: Take 1 tablet by mouth every 6 (six) hours as needed for severe pain.    Dispense:  120 tablet    Refill:  0    Do  not fill until [id number]  . oxyCODONE-acetaminophen (PERCOCET) 7.5-325 MG tablet    Sig: Take 1 tablet by mouth every 6 (six) hours as needed for severe pain.    Dispense:  120 tablet    Refill:  0    Do not fill until [id number]   Patient's Medications  New Prescriptions   No medications on file  Previous Medications   ALBUTEROL (PROVENTIL HFA;VENTOLIN HFA) 108 (90 BASE) MCG/ACT INHALER    Inhale 2 puffs into the lungs every 6 (six) hours as needed for wheezing or shortness of breath.   AMIODARONE (PACERONE) 200 MG TABLET    Take 1 tablet (200 mg total) daily by mouth.   ASPIRIN EC 81 MG TABLET    Take 1 tablet (81 mg total) daily by mouth.   CARVEDILOL (COREG) 6.25 MG TABLET    Take 1 tablet (6.25 mg total) 2 (two) times daily by mouth.   CLOTRIMAZOLE (LOTRIMIN) 1 % CREAM    Apply 1 application topically 2 (two) times daily as needed (rash).    CYCLOBENZAPRINE (FLEXERIL) 10 MG TABLET    Take 1 tablet (10 mg total) by mouth 3 (three) times daily as needed. for muscle spams   DEXLANSOPRAZOLE (DEXILANT) 60 MG CAPSULE    Take 60 mg by mouth daily.   DICLOFENAC SODIUM (VOLTAREN) 1 % GEL    Apply 2 g topically 4 (four) times daily as needed (pain).    DIPHENHYDRAMINE (BENADRYL) 25 MG CAPSULE    Take 25 mg by mouth every 8 (eight) hours as needed.   DOCUSATE SODIUM (COLACE) 100 MG CAPSULE    Take 200 mg by mouth at bedtime.    FOLIC ACID (FOLVITE) 1 MG TABLET    Take 1 mg by mouth at bedtime.    FUROSEMIDE (LASIX) 40 MG TABLET    Take 80 mg by mouth 2 (two) times daily as needed for fluid or edema.    GABAPENTIN (NEURONTIN) 300 MG CAPSULE    Take 2 capsules (600 mg total) by mouth 3 (three) times daily.   IBUPROFEN (ADVIL,MOTRIN) 800 MG TABLET    Take 1 tablet (800 mg total) by mouth every 8 (eight) hours as  needed.   LEVOCETIRIZINE (XYZAL) 5 MG TABLET    Take 5 mg by mouth at bedtime.    MIRABEGRON ER (MYRBETRIQ) 50 MG TB24 TABLET    Take 50 mg by mouth daily.   MONTELUKAST (SINGULAIR) 10 MG TABLET    Take 10 mg by mouth at bedtime.   MULTIPLE VITAMINS-MINERALS (ICAPS AREDS 2) CAPS    Take 1 capsule by mouth 2 (two) times daily.    MULTIPLE VITAMINS-MINERALS (MULTIVITAMIN GUMMIES ADULTS) CHEW    Chew 2 tablets by mouth daily.    NORTRIPTYLINE (PAMELOR) 50 MG CAPSULE    Take 50 mg by mouth at bedtime.   NYSTATIN (MYCOSTATIN) 100000 UNIT/ML SUSPENSION       OMEGA-3 FATTY ACIDS (FISH OIL) 1000 MG CAPS    Take 1000mg  every 3rd day   POTASSIUM CHLORIDE SA (K-DUR,KLOR-CON) 20 MEQ TABLET    Take 20 mEq by mouth every other day. Take 20 meq every third day   PROBIOTIC PRODUCT (PROBIOTIC COLON SUPPORT) CAPS    Take 1 capsule by mouth at bedtime.   RANOLAZINE (RANEXA) 500 MG 12 HR TABLET    Take 1 tablet (500 mg total) by mouth 2 (two) times daily.   SACUBITRIL-VALSARTAN (ENTRESTO) 24-26 MG    Take 1 tablet by mouth 2 (two)  times daily.   SUCRALFATE (CARAFATE) 1 G TABLET    Take 1 g by mouth 2 (two) times daily as needed (esophageal burning).    TIOTROPIUM (SPIRIVA) 18 MCG INHALATION CAPSULE    Place into inhaler and inhale.   VITAMIN D, ERGOCALCIFEROL, (DRISDOL) 50000 UNITS CAPS CAPSULE    Take 50,000 Units by mouth every 7 (seven) days. thursdays  Modified Medications   Modified Medication Previous Medication   OXYCODONE-ACETAMINOPHEN (PERCOCET) 7.5-325 MG TABLET oxyCODONE-acetaminophen (PERCOCET) 7.5-325 MG tablet      Take 1 tablet by mouth every 6 (six) hours as needed for severe pain.    Take 1 tablet by mouth every 6 (six) hours as needed for severe pain.  Discontinued Medications   No medications on file   ----------------------------------------------------------------------------------------------------------------------  Follow-up: Return in about 2 months (around 05/07/2017) for evaluation,  med refill.    Molli Barrows, MD

## 2017-03-11 ENCOUNTER — Other Ambulatory Visit: Payer: Self-pay

## 2017-03-11 ENCOUNTER — Telehealth: Payer: Self-pay | Admitting: Internal Medicine

## 2017-03-11 MED ORDER — RANOLAZINE ER 500 MG PO TB12: 500.0000 mg | ORAL_TABLET | Freq: Two times a day (BID) | ORAL | 0 refills | Status: DC

## 2017-03-11 MED ORDER — AMIODARONE HCL 200 MG PO TABS: 200.0000 mg | ORAL_TABLET | Freq: Every day | ORAL | 0 refills | Status: DC

## 2017-03-11 MED ORDER — SACUBITRIL-VALSARTAN 24-26 MG PO TABS: 1.0000 | ORAL_TABLET | Freq: Two times a day (BID) | ORAL | 0 refills | Status: DC

## 2017-03-11 MED ORDER — CARVEDILOL 6.25 MG PO TABS: 6.2500 mg | ORAL_TABLET | Freq: Two times a day (BID) | ORAL | 0 refills | Status: DC

## 2017-03-11 MED ORDER — FUROSEMIDE 40 MG PO TABS: 80.0000 mg | ORAL_TABLET | Freq: Two times a day (BID) | ORAL | 0 refills | Status: DC | PRN

## 2017-03-11 NOTE — Telephone Encounter (Signed)
Requested Prescriptions   Signed Prescriptions Disp Refills  . amiodarone (PACERONE) 200 MG tablet 90 tablet 0    Sig: Take 1 tablet (200 mg total) by mouth daily.    Authorizing Provider: Deboraha Sprang    Ordering User: Janan Ridge ranolazine (RANEXA) 500 MG 12 hr tablet 180 tablet 0    Sig: Take 1 tablet (500 mg total) by mouth 2 (two) times daily.    Authorizing Provider: Deboraha Sprang    Ordering User: Anabelle Bungert, [place] (ENTRESTO) 24-26 MG 180 tablet 0    Sig: Take 1 tablet by mouth 2 (two) times daily.    Authorizing Provider: Deboraha Sprang    Ordering User: Janan Ridge carvedilol (COREG) 6.25 MG tablet 180 tablet 0    Sig: Take 1 tablet (6.25 mg total) by mouth 2 (two) times daily.    Authorizing Provider: Deboraha Sprang    Ordering User: Janan Ridge furosemide (LASIX) 40 MG tablet 180 tablet 0    Sig: Take 2 tablets (80 mg total) by mouth 2 (two) times daily as needed for fluid or edema.    Authorizing Provider: Deboraha Sprang    Ordering User: Janan Ridge

## 2017-03-11 NOTE — Telephone Encounter (Signed)
°*  STAT* If patient is at the pharmacy, call can be transferred to refill team.   1. Which medications need to be refilled? (please list name of each medication and dose if known)   Renxa 500 mg Entresto 24-26 mg Amiodarone 200 mg  Carvedilol 6.25 mg  Fursomide 40 mg   2. Which pharmacy/location (including street and city if local pharmacy) is medication to be sent to? [facility]  3. Do they need a 30 day or 90 day supply? 90 day

## 2017-03-11 NOTE — Telephone Encounter (Signed)
Requested Prescriptions   Signed Prescriptions Disp Refills  . amiodarone (PACERONE) 200 MG tablet 90 tablet 0    Sig: Take 1 tablet (200 mg total) by mouth daily.    Authorizing Provider: Deboraha Sprang    Ordering User: Janan Ridge ranolazine (RANEXA) 500 MG 12 hr tablet 180 tablet 0    Sig: Take 1 tablet (500 mg total) by mouth 2 (two) times daily.    Authorizing Provider: Deboraha Sprang    Ordering User: Aleda Madl, [place] (ENTRESTO) 24-26 MG 180 tablet 0    Sig: Take 1 tablet by mouth 2 (two) times daily.    Authorizing Provider: Deboraha Sprang    Ordering User: Janan Ridge carvedilol (COREG) 6.25 MG tablet 180 tablet 0    Sig: Take 1 tablet (6.25 mg total) by mouth 2 (two) times daily.    Authorizing Provider: Deboraha Sprang    Ordering User: Janan Ridge furosemide (LASIX) 40 MG tablet 180 tablet 0    Sig: Take 2 tablets (80 mg total) by mouth 2 (two) times daily as needed for fluid or edema.    Authorizing Provider: Deboraha Sprang    Ordering User: Janan Ridge

## 2017-03-12 ENCOUNTER — Telehealth: Payer: Self-pay | Admitting: Anesthesiology

## 2017-03-12 NOTE — Telephone Encounter (Signed)
Patient wants to start using [facility]  [phone] Needs Flexiril, Ibuprofen, and gabapentin called to this new pharmacy

## 2017-03-13 LAB — TOXASSURE SELECT 13 (MW), URINE

## 2017-03-25 ENCOUNTER — Ambulatory Visit (INDEPENDENT_AMBULATORY_CARE_PROVIDER_SITE_OTHER): Payer: Medicare Other

## 2017-03-25 DIAGNOSIS — I493 Ventricular premature depolarization: Secondary | ICD-10-CM

## 2017-03-25 DIAGNOSIS — I428 Other cardiomyopathies: Secondary | ICD-10-CM | POA: Diagnosis not present

## 2017-03-25 DIAGNOSIS — I5022 Chronic systolic (congestive) heart failure: Secondary | ICD-10-CM | POA: Diagnosis not present

## 2017-03-26 ENCOUNTER — Telehealth: Payer: Self-pay | Admitting: Internal Medicine

## 2017-03-26 NOTE — Telephone Encounter (Signed)
I attempted to call the patient at her primary contact #- I left a message for her to call. I also attempted to call the Cardiac Rehab contact # left in this message- no answer after multiple rings.

## 2017-03-26 NOTE — Telephone Encounter (Signed)
Pt is currently in Rehab at [facility] and a patient of [place].

## 2017-03-26 NOTE — Telephone Encounter (Signed)
New message    Patient wearing holter monitor has complaint of sob and swelling  Pt c/o swelling: STAT is pt has developed SOB within 24 hours  1) How much weight have you gained and in what time span?  4lbs in 2 days  2) If swelling, where is the swelling located? Ankles   3) Are you currently taking a fluid pill?  yes  4) Are you currently SOB? Yes and chest discomfort   5) Do you have a log of your daily weights (if so, list)? Doesn't have with her at rehab   6) Have you gained 3 pounds in a day or 5 pounds in a week? Yes   7) Have you traveled recently?  no

## 2017-03-29 NOTE — Telephone Encounter (Signed)
I was able to speak with the patient today. She reports that she has been going to cardiac rehab on M/W/F and getting weighed there in the afternoon as her home scale is not working appropriately at this time. On Wednesday last week she was 302 lbs & on Friday she was 306 lbs. She was having swelling in her lower extremities at the time and having fullness in her chest and abdomen. Her BP/ HR have also been running low. She was 114/59/ HR- 52 at cardiac rehab on Friday- she was exercising on the bike and told to come off per cardiac rehab staff. At home her BP has been 113/51 - HR couldn't registered or read as "low" one time & then she had another reading of 107/47 & HR- 50. She denies dizziness/ lightheadedness/ pre-syncope. However, she felt like her legs were weak one day which made her feel like she would fall.  Cardiac meds reviewed with the patient: 1) Amiodarone 200 mg once daily 2) Coreg 6.25 mg BID 3) Ranexa 500 mg BID 4) Entresto 24/26 mg BID 5) furosemide 40 mg once daily, but she states she was told by Dr. Caryl Comes that she could take lasix 80 mg in the AM & 40 mg in the PM x 3 days if she was retaining fluid - she did take this increased dose of lasix on Friday/ Saturday/ & Sunday. - she reports increased urination and some decrease in her leg swelling.   I inquired if she can tell if her heart is in/ out of rhythm- she states she used to not be able to tell, but for the last couple of weeks she has had "fluttering" in her chest with occasional pains to her left chest. She just took off a 48 hour holter that was ordered by Ignacia Bayley, NP as a follow up to her PVC's. She will bring this back to the office today. She was scheduled for follow up with Dr. Caryl Comes in April, but have had advised her we move this up to this week so we can review her monitor and address her edema/ low BP/ low HR (could be PVC's also). She is agreeable- she will see Dr. Caryl Comes on Thursday 04/01/17. She is  advised to continue with her PRN lasix as needed in addition to her lasix 40 mg once daily for edema.  To Dr. Caryl Comes as an Juluis Rainier

## 2017-03-31 NOTE — Telephone Encounter (Signed)
Noted. Thanks.

## 2017-04-01 ENCOUNTER — Encounter: Payer: Self-pay | Admitting: Internal Medicine

## 2017-04-01 ENCOUNTER — Ambulatory Visit (INDEPENDENT_AMBULATORY_CARE_PROVIDER_SITE_OTHER): Payer: Medicare Other | Admitting: Internal Medicine

## 2017-04-01 ENCOUNTER — Ambulatory Visit
Admission: RE | Admit: 2017-04-01 | Discharge: 2017-04-01 | Disposition: A | Discharge status: Home / Self Care | Payer: Medicare Other | Source: Ambulatory Visit | Attending: Internal Medicine | Admitting: Internal Medicine

## 2017-04-01 VITALS — BP 100/60 | HR 85 | Ht 67.0 in | Wt 303.0 lb

## 2017-04-01 DIAGNOSIS — Z79899 Other long term (current) drug therapy: Secondary | ICD-10-CM

## 2017-04-01 DIAGNOSIS — I493 Ventricular premature depolarization: Secondary | ICD-10-CM | POA: Diagnosis not present

## 2017-04-01 DIAGNOSIS — I10 Essential (primary) hypertension: Secondary | ICD-10-CM | POA: Diagnosis not present

## 2017-04-01 DIAGNOSIS — I428 Other cardiomyopathies: Secondary | ICD-10-CM

## 2017-04-01 NOTE — Progress Notes (Signed)
Patient Care Team: [name], MD as PCP - General (Family Medicine) Deboraha Sprang, MD as PCP - Cardiology (Cardiology)   HPI  Wendy Sanchez is a 67 y.o. 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  Initially seen in consultation 11/18  amiodarone was initiated   and subsequently ranolazine and then [facility] were added.  She is tolerating these medications well.  Blood pressures have been borderline but not with significant symptoms.   She saw Dr. Billey Co who stopped her ramipril with the intention of starting her on Entresto.  In the wake of that, she has had increasing problems with fluid retention shortness of breath ectopy orthopnea nocturnal dyspnea.  Yesterday she took an extra Lasix with improved urine output.       She is struggling with volume overload with both edema and abdominal distention; she has had more dyspnea.  She has occasional atypical chest pain aware of more palpitations.  She notes that she urinates when she takes 80 but not 40 mg.  Sodium intake is restricted.    DATE TEST    6/18    Echo   EF 30 % LVE/LAE  8/18    Cath   EF 25 % Normal CAs        Date Cr K TSH LFTs Mg Hgb   9/18 0.8 4.5     12.8  2/19 1.10 5.0 1.82 11     DATE Holter PVCs meds  11/18 41% +amio + ranolazine  2/19 17% +entresto  3/19 24% amio d/c      Past Medical History:  Diagnosis Date  . Allergic rhinitis   . Arthritis   . Asthma    problems with fumes and aerosols cause asthma  . Complication of anesthesia    awakens during surgery; has occurred with last 3-4 surgeries   . Environmental allergies    fumes   . Fibromyalgia   . GERD (gastroesophageal reflux disease)   . History of bronchitis   . Hypertension   . NICM (nonischemic cardiomyopathy) ([place])    a. ? PVC mediated;  b. 06/2016 Echo: EF 25-30%, mild LVH, mild LAE, mild AI/MR/TR/PR; c. 08/2016 Cath: nl cors, EF 25%.  . Numbness    hands bilat when driving; improves when not  preforming task   . PVC's (premature ventricular contractions)    a. 11/2016 Amio started;  b. 11/29/16 48h Holter: 63785 PVC's (41%); c. 12/2016 24h Holter: 18040 PVC's (15%); c. 02/2017 48h Holter: 37262 PVC's (41%).  . Status post gastric banding   . Tinnitus    comes and goes   . Vertigo    6 months ago approx     Past Surgical History:  Procedure Laterality Date  . ABDOMINAL HYSTERECTOMY  1979   left ovary remains  . ABDOMINAL HYSTERECTOMY    . CHOLECYSTECTOMY    . fibrous tissue removed from right shoulder and back of neck      2 years ago   . GANGLION CYST EXCISION    . GASTRIC BYPASS  1980   states had bypass with banding and band left in -  . KNEE SURGERY Bilateral    both knees  2001  . RIGHT/LEFT HEART CATH AND CORONARY ANGIOGRAPHY Bilateral 08/12/2016   Procedure: Right/Left Heart Cath and Coronary Angiography;  Surgeon: Yolonda Kida, MD;  Location: [facility];  Service: Cardiovascular;  Laterality: Bilateral;  . TONSILLECTOMY     age  14  . TOTAL KNEE ARTHROPLASTY Left 08/02/2015   Procedure: LEFT TOTAL KNEE ARTHROPLASTY;  Surgeon: Mcarthur Rossetti, MD;  Location: [facility];  Service: Orthopedics;  Laterality: Left;  . TOTAL KNEE ARTHROPLASTY Right 12/13/2015   Procedure: RIGHT TOTAL KNEE ARTHROPLASTY;  Surgeon: Mcarthur Rossetti, MD;  Location: [facility];  Service: Orthopedics;  Laterality: Right;    Current Outpatient Medications  Medication Sig Dispense Refill  . albuterol (PROVENTIL HFA;VENTOLIN HFA) 108 (90 Base) MCG/ACT inhaler Inhale 2 puffs into the lungs every 6 (six) hours as needed for wheezing or shortness of breath.    Marland Kitchen amiodarone (PACERONE) 200 MG tablet Take 1 tablet (200 mg total) by mouth daily. 90 tablet 0  . aspirin EC 81 MG tablet Take 1 tablet (81 mg total) daily by mouth. 90 tablet 3  . carvedilol (COREG) 6.25 MG tablet Take 1 tablet (6.25 mg total) by mouth 2 (two) times daily. 180 tablet 0  . clotrimazole (LOTRIMIN) 1 % cream  Apply 1 application topically 2 (two) times daily as needed (rash).     . cyclobenzaprine (FLEXERIL) 10 MG tablet Take 1 tablet (10 mg total) by mouth 3 (three) times daily as needed. for muscle spams 90 tablet 5  . dexlansoprazole (DEXILANT) 60 MG capsule Take 60 mg by mouth daily.    Marland Kitchen dexlansoprazole (DEXILANT) 60 MG capsule Take by mouth.    . diclofenac sodium (VOLTAREN) 1 % GEL Apply 2 g topically 4 (four) times daily as needed (pain).     Marland Kitchen diphenhydrAMINE (BENADRYL) 25 mg capsule Take 25 mg by mouth every 8 (eight) hours as needed.    . docusate sodium (COLACE) 100 MG capsule Take 200 mg by mouth at bedtime.     . folic acid (FOLVITE) 1 MG tablet Take 1 mg by mouth at bedtime.     . furosemide (LASIX) 40 MG tablet Take 2 tablets (80 mg total) by mouth 2 (two) times daily as needed for fluid or edema. 180 tablet 0  . gabapentin (NEURONTIN) 300 MG capsule Take 2 capsules (600 mg total) by mouth 3 (three) times daily. 180 capsule 5  . ibuprofen (ADVIL,MOTRIN) 800 MG tablet Take 1 tablet (800 mg total) by mouth every 8 (eight) hours as needed. 90 tablet 5  . levocetirizine (XYZAL) 5 MG tablet Take 5 mg by mouth at bedtime.     . mirabegron ER (MYRBETRIQ) 50 MG TB24 tablet Take 50 mg by mouth daily.    . montelukast (SINGULAIR) 10 MG tablet Take 10 mg by mouth at bedtime.    . Multiple Vitamins-Minerals (ICAPS AREDS 2) CAPS Take 1 capsule by mouth 2 (two) times daily.     . Multiple Vitamins-Minerals (MULTIVITAMIN GUMMIES ADULTS) CHEW Chew 2 tablets by mouth daily.     . nortriptyline (PAMELOR) 50 MG capsule Take 50 mg by mouth at bedtime.    . Omega-3 Fatty Acids (FISH OIL) 1000 MG CAPS Take 1000mg  every 3rd day    . oxyCODONE-acetaminophen (PERCOCET) 7.5-325 MG tablet Take 1 tablet by mouth every 6 (six) hours as needed for severe pain. 120 tablet 0  . potassium chloride SA (K-DUR,KLOR-CON) 20 MEQ tablet Take 20 mEq by mouth every other day. Take 20 meq every third day    . Probiotic Product  (PROBIOTIC COLON SUPPORT) CAPS Take 1 capsule by mouth at bedtime.    . ranolazine (RANEXA) 500 MG 12 hr tablet Take 1 tablet (500 mg total) by mouth 2 (two) times daily. [place]  tablet 0  . sacubitril-valsartan (ENTRESTO) 24-26 MG Take 1 tablet by mouth 2 (two) times daily. 180 tablet 0  . sucralfate (CARAFATE) 1 G tablet Take 1 g by mouth 2 (two) times daily as needed (esophageal burning).     Marland Kitchen tiotropium (SPIRIVA) 18 MCG inhalation capsule Place into inhaler and inhale.    . Vitamin D, Ergocalciferol, (DRISDOL) 50000 UNITS CAPS capsule Take 50,000 Units by mouth every 7 (seven) days. thursdays     No current facility-administered medications for this visit.     Allergies  Allergen Reactions  . Hydrocodone-Acetaminophen Swelling  . Iodine Anaphylaxis and Swelling  . Other     ALLERGY TO METAL - BLACKENS SKIN AND CAUSES A RASH   . Tape Swelling    Skin comes off.  Paper tape is ok  . Shellfish Allergy Nausea And Vomiting    Episode of GI infection after eating clam chowder. Still eats shrimp and other seafood      Review of Systems negative except from HPI and PMH  Physical Exam BP 100/60 (BP Location: Left Arm, Patient Position: Sitting, Cuff Size: Large)   Pulse 85   Ht 5\' 7"  (1.702 m)   Wt (!) 303 lb (137.4 kg)   SpO2 97%   BMI 47.46 kg/m  Well developed and Morbidly obese  in no acute distress HENT normal Neck supple with JVP difficult to discern Clear Regular rate and rhythm, no murmurs or gallops Abd-soft with active BS No Clubbing cyanosis 1+ edema Skin-warm and dry A & Oriented  Grossly normal sensory and motor function  ECG sinus @ 85 19/09/40 PVC isolated   Assessment and Plan:  Nonischemic cardiomyopathy  PVCs-- 2 dominant morphologies    Diastolic pressure equalization catheterization  Morbidly obese  Chronic systolic heart failure  She remains volume overloaded so will increase her furosemide 80 bid x 5d then resume 80/40  With the persistence  of her PVCs, I have reached out to DRs GT and JA to consider RFCA in this morbidly obese lady  It still is the working hypothesis that her cardiomyopathy is secondary.    She also is in need apparently of diflucan for oral candidiasis per her PCP ( I dont appreciate it)   This should be ok as we are stopping amio and further the QT prolongation with amio is not very "torsadogenic"  We spent more than 50% of our >40 min visit in face to face counseling regarding the above       Current medicines are reviewed at length with the patient today .  The patient does not have concerns regarding medicines.

## 2017-04-01 NOTE — Patient Instructions (Addendum)
Medication Instructions: - Your physician has recommended you make the following change in your medication:   1) STOP amiodarone 2) once your current supply of ranexa (ranolazine) is done you may stop this 3) INCREASE lasix (furosemide) 40 mg- take 2 tablets (80 mg) TWICE daily x 5 days then resume your normal dosing  Labwork: - Your physician recommends that you have lab work today: BMP  Procedures/Testing: - none ordered  Follow-Up: - pending Dr. Olin Pia partner, Dr. Tanna Furry review of your ekg/ heart monitor reports- we will call you once this is reviewed   Any Additional Special Instructions Will Be Listed Below (If Applicable).     If you need a refill on your cardiac medications before your next appointment, please call your pharmacy.

## 2017-04-02 LAB — BASIC METABOLIC PANEL
BUN / CREAT RATIO: 12 (ref 12–28)
BUN: 11 mg/dL (ref 8–27)
CHLORIDE: 106 mmol/L (ref 96–106)
CO2: 24 mmol/L (ref 20–29)
Calcium: 9.7 mg/dL (ref 8.7–10.3)
Creatinine, Ser: 0.91 mg/dL (ref 0.57–1.00)
GFR calc Af Amer: 77 mL/min/{1.73_m2} (ref >59)
GFR calc non Af Amer: 66 mL/min/{1.73_m2} (ref >59)
Glucose: 117 mg/dL — ABNORMAL HIGH (ref 65–99)
POTASSIUM: 4.1 mmol/L (ref 3.5–5.2)
Sodium: 146 mmol/L — ABNORMAL HIGH (ref 134–144)

## 2017-04-15 ENCOUNTER — Ambulatory Visit: Payer: Medicare Other | Admitting: Internal Medicine

## 2017-04-23 ENCOUNTER — Other Ambulatory Visit: Payer: Self-pay | Admitting: Internal Medicine

## 2017-04-28 ENCOUNTER — Encounter: Payer: Self-pay | Admitting: Internal Medicine

## 2017-04-28 ENCOUNTER — Ambulatory Visit: Payer: Medicare Other | Admitting: Internal Medicine

## 2017-04-28 VITALS — BP 122/70 | HR 80 | Ht 67.0 in | Wt 302.0 lb

## 2017-04-28 DIAGNOSIS — I493 Ventricular premature depolarization: Secondary | ICD-10-CM

## 2017-04-28 DIAGNOSIS — I428 Other cardiomyopathies: Secondary | ICD-10-CM | POA: Diagnosis not present

## 2017-04-28 DIAGNOSIS — I5022 Chronic systolic (congestive) heart failure: Secondary | ICD-10-CM | POA: Diagnosis not present

## 2017-04-28 NOTE — Patient Instructions (Addendum)
Medication Instructions:  Your physician recommends that you continue on your current medications as directed. Please refer to the Current Medication list given to you today.  Labwork: None ordered.  Testing/Procedures: Your physician has recommended that you have an ablation. Catheter ablation is a medical procedure used to treat some cardiac arrhythmias (irregular heartbeats). During catheter ablation, a long, thin, flexible tube is put into a blood vessel in your groin (upper thigh), or neck. This tube is called an ablation catheter. It is then guided to your heart through the blood vessel. Radio frequency waves destroy small areas of heart tissue where abnormal heartbeats may cause an arrhythmia to start. Please see the instruction sheet given to you today.  Follow-Up:  The following days are available for procedures:  Ablation days: April  22, 23, 24 May 6, 8, 9, 22, 24,   If you decide on a day please give me a call:  Bing Neighbors [phone]  Any Other Special Instructions Will Be Listed Below (If Applicable).  If you need a refill on your cardiac medications before your next appointment, please call your pharmacy.   Cardiac Ablation Cardiac ablation is a procedure to disable (ablate) a small amount of heart tissue in very specific places. The heart has many electrical connections. Sometimes these connections are abnormal and can cause the heart to beat very fast or irregularly. Ablating some of the problem areas can improve the heart rhythm or return it to normal. Ablation may be done for people who:  Have Wolff-Parkinson-Patrone syndrome.  Have fast heart rhythms (tachycardia).  Have taken medicines for an abnormal heart rhythm (arrhythmia) that were not effective or caused side effects.  Have a high-risk heartbeat that may be life-threatening.  During the procedure, a small incision is made in the neck or the groin, and a long, thin, flexible tube (catheter) is inserted into  the incision and moved to the heart. Small devices (electrodes) on the tip of the catheter will send out electrical currents. A type of X-ray (fluoroscopy) will be used to help guide the catheter and to provide images of the heart. Tell a health care provider about:  Any allergies you have.  All medicines you are taking, including vitamins, herbs, eye drops, creams, and over-the-counter medicines.  Any problems you or family members have had with anesthetic medicines.  Any blood disorders you have.  Any surgeries you have had.  Any medical conditions you have, such as kidney failure.  Whether you are pregnant or may be pregnant. What are the risks? Generally, this is a safe procedure. However, problems may occur, including:  Infection.  Bruising and bleeding at the catheter insertion site.  Bleeding into the chest, especially into the sac that surrounds the heart. This is a serious complication.  Stroke or blood clots.  Damage to other structures or organs.  Allergic reaction to medicines or dyes.  Need for a permanent pacemaker if the normal electrical system is damaged. A pacemaker is a small computer that sends electrical signals to the heart and helps your heart beat normally.  The procedure not being fully effective. This may not be recognized until months later. Repeat ablation procedures are sometimes required.  What happens before the procedure?  Follow instructions from your health care provider about eating or drinking restrictions.  Ask your health care provider about: ? Changing or stopping your regular medicines. This is especially important if you are taking diabetes medicines or blood thinners. ? Taking medicines such as aspirin and  ibuprofen. These medicines can thin your blood. Do not take these medicines before your procedure if your health care provider instructs you not to.  Plan to have someone take you home from the hospital or clinic.  If you will  be going home right after the procedure, plan to have someone with you for 24 hours. What happens during the procedure?  To lower your risk of infection: ? Your health care team will wash or sanitize their hands. ? Your skin will be washed with soap. ? Hair may be removed from the incision area.  An IV tube will be inserted into one of your veins.  You will be given a medicine to help you relax (sedative).  The skin on your neck or groin will be numbed.  An incision will be made in your neck or your groin.  A needle will be inserted through the incision and into a large vein in your neck or groin.  A catheter will be inserted into the needle and moved to your heart.  Dye may be injected through the catheter to help your surgeon see the area of the heart that needs treatment.  Electrical currents will be sent from the catheter to ablate heart tissue in desired areas. There are three types of energy that may be used to ablate heart tissue: ? Heat (radiofrequency energy). ? Laser energy. ? Extreme cold (cryoablation).  When the necessary tissue has been ablated, the catheter will be removed.  Pressure will be held on the catheter insertion area to prevent excessive bleeding.  A bandage (dressing) will be placed over the catheter insertion area. The procedure may vary among health care providers and hospitals. What happens after the procedure?  Your blood pressure, heart rate, breathing rate, and blood oxygen level will be monitored until the medicines you were given have worn off.  Your catheter insertion area will be monitored for bleeding. You will need to lie still for a few hours to ensure that you do not bleed from the catheter insertion area.  Do not drive for 24 hours or as long as directed by your health care provider. Summary  Cardiac ablation is a procedure to disable (ablate) a small amount of heart tissue in very specific places. Ablating some of the problem areas  can improve the heart rhythm or return it to normal.  During the procedure, electrical currents will be sent from the catheter to ablate heart tissue in desired areas. This information is not intended to replace advice given to you by your health care provider. Make sure you discuss any questions you have with your health care provider. Document Released: 05/17/2008 Document Revised: 11/18/2015 Document Reviewed: 11/18/2015 Elsevier Interactive Patient Education  [employer].

## 2017-04-28 NOTE — Progress Notes (Signed)
HPI Wendy Sanchez is referred today by Dr. Renaldo Reel to consider PVC ablation. She is a pleasant 67 yo woman with chronic systolic heart failure, a non-ischemic CM, HTN, and has been treated with maximal medical therapy. She has worn multiple cardiac monitors to assess response to AA drug therapy and always has between 16 and 24K PVC's in 24 hours. She has not had syncope. The hope is that her EF will improve if we can get her PVC's under better control. She has some palpitations but these do not appear to be disabling.  Allergies  Allergen Reactions  . Hydrocodone-Acetaminophen Swelling  . Iodine Anaphylaxis and Swelling  . Other     ALLERGY TO METAL - BLACKENS SKIN AND CAUSES A RASH   . Tape Swelling    Skin comes off.  Paper tape is ok  . Shellfish Allergy Nausea And Vomiting    Episode of GI infection after eating clam chowder. Still eats shrimp and other seafood     Current Outpatient Medications  Medication Sig Dispense Refill  . albuterol (PROVENTIL HFA;VENTOLIN HFA) 108 (90 Base) MCG/ACT inhaler Inhale 2 puffs into the lungs every 6 (six) hours as needed for wheezing or shortness of breath.    Marland Kitchen aspirin EC 81 MG tablet Take 1 tablet (81 mg total) daily by mouth. 90 tablet 3  . carvedilol (COREG) 6.25 MG tablet Take 1 tablet (6.25 mg total) by mouth 2 (two) times daily. 180 tablet 0  . clotrimazole (LOTRIMIN) 1 % cream Apply 1 application topically 2 (two) times daily as needed (rash).     . cyclobenzaprine (FLEXERIL) 10 MG tablet Take 1 tablet (10 mg total) by mouth 3 (three) times daily as needed. for muscle spams 90 tablet 5  . dexlansoprazole (DEXILANT) 60 MG capsule Take 60 mg by mouth daily.    . diclofenac sodium (VOLTAREN) 1 % GEL Apply 2 g topically 4 (four) times daily as needed (pain).     Marland Kitchen diphenhydrAMINE (BENADRYL) 25 mg capsule Take 25 mg by mouth every 8 (eight) hours as needed.    . docusate sodium (COLACE) 100 MG capsule Take 200 mg by mouth at bedtime.     . folic  acid (FOLVITE) 1 MG tablet Take 1 mg by mouth at bedtime.     . furosemide (LASIX) 40 MG tablet Take 2 tablets (80 mg) in the morning & 1 tablet (40 mg) in the afternoon if needed    . gabapentin (NEURONTIN) 300 MG capsule Take 2 capsules (600 mg total) by mouth 3 (three) times daily. 180 capsule 5  . ibuprofen (ADVIL,MOTRIN) 800 MG tablet Take 1 tablet (800 mg total) by mouth every 8 (eight) hours as needed. 90 tablet 5  . levocetirizine (XYZAL) 5 MG tablet Take 5 mg by mouth at bedtime.     . mirabegron ER (MYRBETRIQ) 50 MG TB24 tablet Take 50 mg by mouth daily.    . montelukast (SINGULAIR) 10 MG tablet Take 10 mg by mouth at bedtime.    . Multiple Vitamins-Minerals (ICAPS AREDS 2) CAPS Take 1 capsule by mouth 2 (two) times daily.     . Multiple Vitamins-Minerals (MULTIVITAMIN GUMMIES ADULTS) CHEW Chew 2 tablets by mouth daily.     . nortriptyline (PAMELOR) 50 MG capsule Take 50 mg by mouth at bedtime.    Marland Kitchen nystatin (MYCOSTATIN) 100000 UNIT/ML suspension Take 5 mLs by mouth 4 (four) times daily.    Marland Kitchen nystatin cream (MYCOSTATIN) Apply 1 application topically  2 (two) times daily.    . Omega-3 Fatty Acids (FISH OIL) 1000 MG CAPS Take 1000mg  every 3rd day    . oxyCODONE-acetaminophen (PERCOCET) 7.5-325 MG tablet Take 1 tablet by mouth every 6 (six) hours as needed for severe pain. 120 tablet 0  . potassium chloride SA (K-DUR,KLOR-CON) 20 MEQ tablet Take 20 mEq by mouth every other day. Take 20 meq every third day    . Probiotic Product (PROBIOTIC COLON SUPPORT) CAPS Take 1 capsule by mouth at bedtime.    . ranolazine (RANEXA) 500 MG 12 hr tablet Take 500 mg by mouth 2 (two) times daily.    . sacubitril-valsartan (ENTRESTO) 24-26 MG Take 1 tablet by mouth 2 (two) times daily. 180 tablet 0  . sucralfate (CARAFATE) 1 G tablet Take 1 g by mouth 2 (two) times daily as needed (esophageal burning).     Marland Kitchen tiotropium (SPIRIVA) 18 MCG inhalation capsule Place into inhaler and inhale.    . Vitamin D,  Ergocalciferol, (DRISDOL) 50000 UNITS CAPS capsule Take 50,000 Units by mouth every 7 (seven) days. thursdays     No current facility-administered medications for this visit.      Past Medical History:  Diagnosis Date  . Allergic rhinitis   . Arthritis   . Asthma    problems with fumes and aerosols cause asthma  . Complication of anesthesia    awakens during surgery; has occurred with last 3-4 surgeries   . Environmental allergies    fumes   . Fibromyalgia   . GERD (gastroesophageal reflux disease)   . History of bronchitis   . Hypertension   . NICM (nonischemic cardiomyopathy) ([place])    a. ? PVC mediated;  b. 06/2016 Echo: EF 25-30%, mild LVH, mild LAE, mild AI/MR/TR/PR; c. 08/2016 Cath: nl cors, EF 25%.  . Numbness    hands bilat when driving; improves when not preforming task   . PVC's (premature ventricular contractions)    a. 11/2016 Amio started;  b. 11/29/16 48h Holter: 80998 PVC's (41%); c. 12/2016 24h Holter: 18040 PVC's (15%); c. 02/2017 48h Holter: 37262 PVC's (41%).  . Status post gastric banding   . Tinnitus    comes and goes   . Vertigo    6 months ago approx     ROS:   All systems reviewed and negative except as noted in the HPI.   Past Surgical History:  Procedure Laterality Date  . ABDOMINAL HYSTERECTOMY  1979   left ovary remains  . ABDOMINAL HYSTERECTOMY    . CHOLECYSTECTOMY    . fibrous tissue removed from right shoulder and back of neck      2 years ago   . GANGLION CYST EXCISION    . GASTRIC BYPASS  1980   states had bypass with banding and band left in -  . KNEE SURGERY Bilateral    both knees  2001  . RIGHT/LEFT HEART CATH AND CORONARY ANGIOGRAPHY Bilateral 08/12/2016   Procedure: Right/Left Heart Cath and Coronary Angiography;  Surgeon: Yolonda Kida, MD;  Location: [facility];  Service: Cardiovascular;  Laterality: Bilateral;  . TONSILLECTOMY     age 52  . TOTAL KNEE ARTHROPLASTY Left 08/02/2015   Procedure: LEFT TOTAL KNEE  ARTHROPLASTY;  Surgeon: Mcarthur Rossetti, MD;  Location: [facility];  Service: Orthopedics;  Laterality: Left;  . TOTAL KNEE ARTHROPLASTY Right 12/13/2015   Procedure: RIGHT TOTAL KNEE ARTHROPLASTY;  Surgeon: Mcarthur Rossetti, MD;  Location: [facility];  Service: Orthopedics;  Laterality: Right;     Family History  Problem Relation Age of Onset  . Hypertension Father   . Deep vein thrombosis Father   . Dementia Mother   . Diabetes Sister   . Congestive Heart Failure Sister   . Congestive Heart Failure Brother   . Congestive Heart Failure Daughter   . Congestive Heart Failure Son   . Breast cancer Maternal Aunt        great aunt     Social History   Socioeconomic History  . Marital status: Single    Spouse name: Not on file  . Number of children: Not on file  . Years of education: Not on file  . Highest education level: Not on file  Occupational History  . Not on file  Social Needs  . Financial resource strain: Not on file  . Food insecurity:    Worry: Not on file    Inability: Not on file  . Transportation needs:    Medical: Not on file    Non-medical: Not on file  Tobacco Use  . Smoking status: Current Some Day Smoker    Packs/day: 0.10    Years: 50.00    Pack years: 5.00    Last attempt to quit: 08/22/2016    Years since quitting: 0.6  . Smokeless tobacco: Former Systems developer    Types: Snuff, Sarina Ser    Quit date: 01/13/1995  Substance and Sexual Activity  . Alcohol use: No    Alcohol/week: 0.0 oz  . Drug use: No  . Sexual activity: Not on file  Lifestyle  . Physical activity:    Days per week: Not on file    Minutes per session: Not on file  . Stress: Not on file  Relationships  . Social connections:    Talks on phone: Not on file    Gets together: Not on file    Attends religious service: Not on file    Active member of club or organization: Not on file    Attends meetings of clubs or organizations: Not on file    Relationship status: Not on file  .  Intimate partner violence:    Fear of current or ex partner: Not on file    Emotionally abused: Not on file    Physically abused: Not on file    Forced sexual activity: Not on file  Other Topics Concern  . Not on file  Social History Narrative  . Not on file     BP 122/70   Pulse 80   Ht 5\' 7"  (1.702 m)   Wt (!) 302 lb (137 kg)   BMI 47.30 kg/m   Physical Exam:  Well appearing NAD HEENT: Unremarkable Neck:  No JVD, no thyromegally Lymphatics:  No adenopathy Back:  No CVA tenderness Lungs:  Clear with no wheezes HEART:  IRegular rate rhythm, no murmurs, no rubs, no clicks Abd:  soft, obese, positive bowel sounds, no organomegally, no rebound, no guarding Ext:  2 plus pulses, no edema, no cyanosis, no clubbing Skin:  No rashes no nodules Neuro:  CN II through XII intact, motor grossly intact  EKG - NSR with frequent PVC's, multifocal   Assess/Plan: 1. PVC's - She has failed medical therapy although she has not been treated with amiodarone. I have reviewed her heart monitors. 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 I do not plan to use  general anesthesia out of concerns for suppressing the ectopy. I have discussed the risks/benefits/goals/expectations of catheter ablation and she wishes to proceed.  I spent 30 minutes discussing producing her note including 50% face to face time.  Mikle Bosworth.D.

## 2017-04-29 ENCOUNTER — Ambulatory Visit: Payer: Medicare Other | Admitting: Internal Medicine

## 2017-05-03 ENCOUNTER — Ambulatory Visit: Payer: Medicare Other | Attending: Anesthesiology | Admitting: Anesthesiology

## 2017-05-03 ENCOUNTER — Other Ambulatory Visit: Payer: Self-pay

## 2017-05-03 ENCOUNTER — Encounter: Payer: Self-pay | Admitting: Anesthesiology

## 2017-05-03 VITALS — BP 134/85 | HR 42 | Temp 98.1°F | Resp 16 | Ht 67.0 in | Wt 301.0 lb

## 2017-05-03 DIAGNOSIS — M51369 Other intervertebral disc degeneration, lumbar region without mention of lumbar back pain or lower extremity pain: Secondary | ICD-10-CM

## 2017-05-03 DIAGNOSIS — M19011 Primary osteoarthritis, right shoulder: Secondary | ICD-10-CM | POA: Diagnosis not present

## 2017-05-03 DIAGNOSIS — Z7982 Long term (current) use of aspirin: Secondary | ICD-10-CM | POA: Diagnosis not present

## 2017-05-03 DIAGNOSIS — G8929 Other chronic pain: Secondary | ICD-10-CM

## 2017-05-03 DIAGNOSIS — Z79899 Other long term (current) drug therapy: Secondary | ICD-10-CM | POA: Diagnosis not present

## 2017-05-03 DIAGNOSIS — M25551 Pain in right hip: Secondary | ICD-10-CM | POA: Diagnosis not present

## 2017-05-03 DIAGNOSIS — M48062 Spinal stenosis, lumbar region with neurogenic claudication: Secondary | ICD-10-CM | POA: Diagnosis not present

## 2017-05-03 DIAGNOSIS — Z79891 Long term (current) use of opiate analgesic: Secondary | ICD-10-CM | POA: Insufficient documentation

## 2017-05-03 DIAGNOSIS — I4891 Unspecified atrial fibrillation: Secondary | ICD-10-CM | POA: Diagnosis not present

## 2017-05-03 DIAGNOSIS — G894 Chronic pain syndrome: Secondary | ICD-10-CM | POA: Diagnosis present

## 2017-05-03 DIAGNOSIS — M1712 Unilateral primary osteoarthritis, left knee: Secondary | ICD-10-CM | POA: Diagnosis not present

## 2017-05-03 DIAGNOSIS — F1190 Opioid use, unspecified, uncomplicated: Secondary | ICD-10-CM | POA: Diagnosis not present

## 2017-05-03 DIAGNOSIS — M1612 Unilateral primary osteoarthritis, left hip: Secondary | ICD-10-CM | POA: Insufficient documentation

## 2017-05-03 DIAGNOSIS — M5136 Other intervertebral disc degeneration, lumbar region: Secondary | ICD-10-CM | POA: Insufficient documentation

## 2017-05-03 MED ORDER — GABAPENTIN 300 MG PO CAPS: 600.0000 mg | ORAL_CAPSULE | Freq: Three times a day (TID) | ORAL | 0 refills | Status: DC

## 2017-05-03 MED ORDER — IBUPROFEN 800 MG PO TABS: 800.0000 mg | ORAL_TABLET | Freq: Three times a day (TID) | ORAL | 1 refills | Status: DC | PRN

## 2017-05-03 MED ORDER — CYCLOBENZAPRINE HCL 10 MG PO TABS: 10.0000 mg | ORAL_TABLET | Freq: Three times a day (TID) | ORAL | 1 refills | Status: DC | PRN

## 2017-05-03 MED ORDER — OXYCODONE-ACETAMINOPHEN 7.5-325 MG PO TABS
1.0000 | ORAL_TABLET | Freq: Four times a day (QID) | ORAL | 0 refills | Status: DC | PRN
Start: 2017-05-03 — End: 2017-05-03

## 2017-05-03 MED ORDER — OXYCODONE-ACETAMINOPHEN 7.5-325 MG PO TABS: 1.0000 | ORAL_TABLET | Freq: Four times a day (QID) | ORAL | 0 refills | Status: DC | PRN

## 2017-05-03 NOTE — Progress Notes (Signed)
Nursing Pain Medication Assessment:  Safety precautions to be maintained throughout the outpatient stay will include: orient to surroundings, keep bed in low position, maintain call bell within reach at all times, provide assistance with transfer out of bed and ambulation.  Medication Inspection Compliance: Pill count conducted under aseptic conditions, in front of the patient. Neither the pills nor the bottle was removed from the patient's sight at any time. Once count was completed pills were immediately returned to the patient in their original bottle.  Medication: Oxycodone/APAP Pill/Patch Count: 45 of 120 pills remain Pill/Patch Appearance: Markings consistent with prescribed medication Bottle Appearance: Standard pharmacy container. Clearly labeled. Filled Date: 04 / 03 / 2019 Last Medication intake:  Today

## 2017-05-03 NOTE — Patient Instructions (Signed)
You have been given 2 scripts for Oxycodone today to last until 07-12-17

## 2017-05-04 NOTE — Progress Notes (Signed)
Subjective:  Patient ID: Wendy Sanchez, female    DOB: January 09, 1951  Age: 67 y.o. MRN: [id number]  CC: Foot Pain (bilateral); Wrist Pain (left); and Shoulder Pain   Procedure: None  HPI Wendy Sanchez presents for reevaluation.  She was last seen 2 months ago and has been doing well with her regimen.  She still taking her oxycodone as prescribed and doing well with this based on her narcotic assessment sheet she maintains that she gets good functional lifestyle improvement with the medications sleeps better at night and is having minimal side effects.  The quality characteristic distribution of her low back pain have been stable in nature with no significant changes noted.  Her lower extremity strength is at baseline.  She has not had much success at weight loss.  She is also been continued to have some problems with her A. fib and is contemplating a future cardiac ablation.  Otherwise she is been in her usual state of health and tolerating her medications without difficulty.  Outpatient Medications Prior to Visit  Medication Sig Dispense Refill  . albuterol (PROVENTIL HFA;VENTOLIN HFA) 108 (90 Base) MCG/ACT inhaler Inhale 2 puffs into the lungs every 6 (six) hours as needed for wheezing or shortness of breath.    Marland Kitchen aspirin EC 81 MG tablet Take 1 tablet (81 mg total) daily by mouth. 90 tablet 3  . carvedilol (COREG) 6.25 MG tablet Take 1 tablet (6.25 mg total) by mouth 2 (two) times daily. 180 tablet 0  . clotrimazole (LOTRIMIN) 1 % cream Apply 1 application topically 2 (two) times daily as needed (rash).     Marland Kitchen dexlansoprazole (DEXILANT) 60 MG capsule Take 60 mg by mouth daily.    . diclofenac sodium (VOLTAREN) 1 % GEL Apply 2 g topically 4 (four) times daily as needed (pain).     Marland Kitchen diphenhydrAMINE (BENADRYL) 25 mg capsule Take 25 mg by mouth every 8 (eight) hours as needed.    . docusate sodium (COLACE) 100 MG capsule Take 200 mg by mouth at bedtime.     . folic acid (FOLVITE) 1 MG tablet  Take 1 mg by mouth at bedtime.     . furosemide (LASIX) 40 MG tablet Take 2 tablets (80 mg) in the morning & 1 tablet (40 mg) in the afternoon if needed    . levocetirizine (XYZAL) 5 MG tablet Take 5 mg by mouth at bedtime.     . mirabegron ER (MYRBETRIQ) 50 MG TB24 tablet Take 50 mg by mouth daily.    . montelukast (SINGULAIR) 10 MG tablet Take 10 mg by mouth at bedtime.    . Multiple Vitamins-Minerals (ICAPS AREDS 2) CAPS Take 1 capsule by mouth 2 (two) times daily.     . Multiple Vitamins-Minerals (MULTIVITAMIN GUMMIES ADULTS) CHEW Chew 2 tablets by mouth daily.     . nortriptyline (PAMELOR) 50 MG capsule Take 50 mg by mouth at bedtime.    Marland Kitchen nystatin (MYCOSTATIN) 100000 UNIT/ML suspension Take 5 mLs by mouth 4 (four) times daily.    Marland Kitchen nystatin cream (MYCOSTATIN) Apply 1 application topically 2 (two) times daily.    . potassium chloride SA (K-DUR,KLOR-CON) 20 MEQ tablet Take 20 mEq by mouth every other day. Take 20 meq every third day    . Probiotic Product (PROBIOTIC COLON SUPPORT) CAPS Take 1 capsule by mouth at bedtime.    . ranolazine (RANEXA) 500 MG 12 hr tablet Take 500 mg by mouth 2 (two) times daily.    Marland Kitchen  sacubitril-valsartan (ENTRESTO) 24-26 MG Take 1 tablet by mouth 2 (two) times daily. 180 tablet 0  . sucralfate (CARAFATE) 1 G tablet Take 1 g by mouth 2 (two) times daily as needed (esophageal burning).     Marland Kitchen tiotropium (SPIRIVA) 18 MCG inhalation capsule Place into inhaler and inhale.    . Vitamin D, Ergocalciferol, (DRISDOL) 50000 UNITS CAPS capsule Take 50,000 Units by mouth every 7 (seven) days. thursdays    . cyclobenzaprine (FLEXERIL) 10 MG tablet Take 1 tablet (10 mg total) by mouth 3 (three) times daily as needed. for muscle spams 90 tablet 5  . gabapentin (NEURONTIN) 300 MG capsule Take 2 capsules (600 mg total) by mouth 3 (three) times daily. 180 capsule 5  . ibuprofen (ADVIL,MOTRIN) 800 MG tablet Take 1 tablet (800 mg total) by mouth every 8 (eight) hours as needed. 90  tablet 5  . oxyCODONE-acetaminophen (PERCOCET) 7.5-325 MG tablet Take 1 tablet by mouth every 6 (six) hours as needed for severe pain. 120 tablet 0  . Omega-3 Fatty Acids (FISH OIL) 1000 MG CAPS Take 1000mg  every 3rd day     No facility-administered medications prior to visit.     Review of Systems CNS: No confusion or sedation Cardiac: No angina  GI: No abdominal pain or constipation Constitutional: No nausea vomiting fevers or chills  Objective:  BP 134/85   Pulse (!) 42   Temp 98.1 F (36.7 C)   Resp 16   Ht 5\' 7"  (1.702 m)   Wt (!) 301 lb (136.5 kg)   SpO2 99%   BMI 47.14 kg/m    BP Readings from Last 3 Encounters:  05/03/17 134/85  04/28/17 122/70  04/01/17 100/60     Wt Readings from Last 3 Encounters:  05/03/17 (!) 301 lb (136.5 kg)  04/28/17 (!) 302 lb (137 kg)  04/01/17 (!) 303 lb (137.4 kg)     Physical Exam Pt is alert and oriented PERRL EOMI HEART IS RRR no murmur or rub LCTA no wheezing or rales MUSCULOSKELETAL reveals some persistent paraspinous muscle tenderness.  She continues to use her walker for assistance muscle tone and bulk to the lower extremities is at baseline.  Labs  No results found for: HGBA1C Lab Results  Component Value Date   LDLCALC (H) 03/16/2007    150        Total Cholesterol/HDL:CHD Risk Coronary Heart Disease Risk Table                     Men   Women  1/2 Average Risk   3.4   3.3   CREATININE 0.91 04/01/2017    -------------------------------------------------------------------------------------------------------------------- Lab Results  Component Value Date   WBC 4.0 10/19/2016   HGB 13.0 10/19/2016   HCT 39.9 10/19/2016   PLT 262 10/19/2016   GLUCOSE 117 (H) 04/01/2017   CHOL (H) 03/16/2007    236        ATP III CLASSIFICATION:  <200     mg/dL   Desirable  200-239  mg/dL   Borderline High  >=240    mg/dL   High   TRIG 76 03/16/2007   HDL 71 03/16/2007   LDLCALC (H) 03/16/2007    150        Total  Cholesterol/HDL:CHD Risk Coronary Heart Disease Risk Table                     Men   Women  1/2 Average Risk   3.4  3.3   ALT 11 02/25/2017   AST 13 02/25/2017   NA 146 (H) 04/01/2017   K 4.1 04/01/2017   CL 106 04/01/2017   CREATININE 0.91 04/01/2017   BUN 11 04/01/2017   CO2 24 04/01/2017   TSH 1.820 02/25/2017   INR 1.11 07/26/2015    --------------------------------------------------------------------------------------------------------------------- No results found.   Assessment & Plan:   Wendy Sanchez was seen today for foot pain, wrist pain and shoulder pain.  Diagnoses and all orders for this visit:  Chronic pain syndrome  Chronic, continuous use of opioids -     Discontinue: oxyCODONE-acetaminophen (PERCOCET) 7.5-325 MG tablet; Take 1 tablet by mouth every 6 (six) hours as needed for severe pain. -     oxyCODONE-acetaminophen (PERCOCET) 7.5-325 MG tablet; Take 1 tablet by mouth every 6 (six) hours as needed for severe pain.  Primary osteoarthritis of right shoulder  Chronic pain of right hip  Spinal stenosis of lumbar region with neurogenic claudication  Primary osteoarthritis of left knee  DDD (degenerative disc disease), lumbar  Other orders -     gabapentin (NEURONTIN) 300 MG capsule; Take 2 capsules (600 mg total) by mouth 3 (three) times daily. -     ibuprofen (ADVIL,MOTRIN) 800 MG tablet; Take 1 tablet (800 mg total) by mouth every 8 (eight) hours as needed. -     cyclobenzaprine (FLEXERIL) 10 MG tablet; Take 1 tablet (10 mg total) by mouth 3 (three) times daily as needed. for muscle spams        ----------------------------------------------------------------------------------------------------------------------  Problem List Items Addressed This Visit      Unprioritized   Chronic pain syndrome - Primary   Osteoarthritis of left knee   Relevant Medications   oxyCODONE-acetaminophen (PERCOCET) 7.5-325 MG tablet   ibuprofen (ADVIL,MOTRIN) 800 MG  tablet   cyclobenzaprine (FLEXERIL) 10 MG tablet   Spinal stenosis of lumbar region    Other Visit Diagnoses    Chronic, continuous use of opioids       Relevant Medications   oxyCODONE-acetaminophen (PERCOCET) 7.5-325 MG tablet   Primary osteoarthritis of right shoulder       Relevant Medications   oxyCODONE-acetaminophen (PERCOCET) 7.5-325 MG tablet   ibuprofen (ADVIL,MOTRIN) 800 MG tablet   cyclobenzaprine (FLEXERIL) 10 MG tablet   Chronic pain of right hip       Relevant Medications   oxyCODONE-acetaminophen (PERCOCET) 7.5-325 MG tablet   gabapentin (NEURONTIN) 300 MG capsule   ibuprofen (ADVIL,MOTRIN) 800 MG tablet   cyclobenzaprine (FLEXERIL) 10 MG tablet   DDD (degenerative disc disease), lumbar       Relevant Medications   oxyCODONE-acetaminophen (PERCOCET) 7.5-325 MG tablet   ibuprofen (ADVIL,MOTRIN) 800 MG tablet   cyclobenzaprine (FLEXERIL) 10 MG tablet        ----------------------------------------------------------------------------------------------------------------------  1. Chronic pain syndrome We will continue with her current regimen.  Refills were given today for May 2 and June 1 for Percocet 7.5 mg tablets.  2. Chronic, continuous use of opioids As above.  We have reviewed the [facility] practitioner database information and it is appropriate.  She appears to be stable on this current regimen and tolerating this well with good functional lifestyle improvement noted.  She is return to clinic in 2 months for reevaluation. - oxyCODONE-acetaminophen (PERCOCET) 7.5-325 MG tablet; Take 1 tablet by mouth every 6 (six) hours as needed for severe pain.  Dispense: 120 tablet; Refill: 0  3. Primary osteoarthritis of right shoulder As above  4. Chronic pain of right hip As above  5. Spinal stenosis of lumbar region with neurogenic claudication As above  6. Primary osteoarthritis of left knee   7. DDD (degenerative disc disease), lumbar As  above.    ----------------------------------------------------------------------------------------------------------------------  I am having Wendy Sanchez maintain her dexlansoprazole, sucralfate, montelukast, diclofenac sodium, folic acid, mirabegron ER, potassium chloride SA, Vitamin D (Ergocalciferol), clotrimazole, PROBIOTIC COLON SUPPORT, MULTIVITAMIN GUMMIES ADULTS, nortriptyline, levocetirizine, ICAPS AREDS 2, docusate sodium, albuterol, Fish Oil, tiotropium, aspirin EC, diphenhydrAMINE, carvedilol, sacubitril-valsartan, furosemide, ranolazine, nystatin, nystatin cream, oxyCODONE-acetaminophen, gabapentin, ibuprofen, and cyclobenzaprine.   Meds ordered this encounter  Medications  . DISCONTD: oxyCODONE-acetaminophen (PERCOCET) 7.5-325 MG tablet    Sig: Take 1 tablet by mouth every 6 (six) hours as needed for severe pain.    Dispense:  120 tablet    Refill:  0    Do not fill until [id number]  . oxyCODONE-acetaminophen (PERCOCET) 7.5-325 MG tablet    Sig: Take 1 tablet by mouth every 6 (six) hours as needed for severe pain.    Dispense:  120 tablet    Refill:  0    Do not fill until [id number]  . gabapentin (NEURONTIN) 300 MG capsule    Sig: Take 2 capsules (600 mg total) by mouth 3 (three) times daily.    Dispense:  540 capsule    Refill:  0    This is a 3 month supply  . ibuprofen (ADVIL,MOTRIN) 800 MG tablet    Sig: Take 1 tablet (800 mg total) by mouth every 8 (eight) hours as needed.    Dispense:  270 tablet    Refill:  1    This is a 3 month supply  . cyclobenzaprine (FLEXERIL) 10 MG tablet    Sig: Take 1 tablet (10 mg total) by mouth 3 (three) times daily as needed. for muscle spams    Dispense:  270 tablet    Refill:  1    This is a 3 month supply. Each Rx must last 3 months before refill   Patient's Medications  New Prescriptions   No medications on file  Previous Medications   ALBUTEROL (PROVENTIL HFA;VENTOLIN HFA) 108 (90 BASE) MCG/ACT INHALER    Inhale 2  puffs into the lungs every 6 (six) hours as needed for wheezing or shortness of breath.   ASPIRIN EC 81 MG TABLET    Take 1 tablet (81 mg total) daily by mouth.   CARVEDILOL (COREG) 6.25 MG TABLET    Take 1 tablet (6.25 mg total) by mouth 2 (two) times daily.   CLOTRIMAZOLE (LOTRIMIN) 1 % CREAM    Apply 1 application topically 2 (two) times daily as needed (rash).    DEXLANSOPRAZOLE (DEXILANT) 60 MG CAPSULE    Take 60 mg by mouth daily.   DICLOFENAC SODIUM (VOLTAREN) 1 % GEL    Apply 2 g topically 4 (four) times daily as needed (pain).    DIPHENHYDRAMINE (BENADRYL) 25 MG CAPSULE    Take 25 mg by mouth every 8 (eight) hours as needed.   DOCUSATE SODIUM (COLACE) 100 MG CAPSULE    Take 200 mg by mouth at bedtime.    FOLIC ACID (FOLVITE) 1 MG TABLET    Take 1 mg by mouth at bedtime.    FUROSEMIDE (LASIX) 40 MG TABLET    Take 2 tablets (80 mg) in the morning & 1 tablet (40 mg) in the afternoon if needed   LEVOCETIRIZINE (XYZAL) 5 MG TABLET    Take 5 mg by mouth at bedtime.    MIRABEGRON  ER (MYRBETRIQ) 50 MG TB24 TABLET    Take 50 mg by mouth daily.   MONTELUKAST (SINGULAIR) 10 MG TABLET    Take 10 mg by mouth at bedtime.   MULTIPLE VITAMINS-MINERALS (ICAPS AREDS 2) CAPS    Take 1 capsule by mouth 2 (two) times daily.    MULTIPLE VITAMINS-MINERALS (MULTIVITAMIN GUMMIES ADULTS) CHEW    Chew 2 tablets by mouth daily.    NORTRIPTYLINE (PAMELOR) 50 MG CAPSULE    Take 50 mg by mouth at bedtime.   NYSTATIN (MYCOSTATIN) 100000 UNIT/ML SUSPENSION    Take 5 mLs by mouth 4 (four) times daily.   NYSTATIN CREAM (MYCOSTATIN)    Apply 1 application topically 2 (two) times daily.   OMEGA-3 FATTY ACIDS (FISH OIL) 1000 MG CAPS    Take 1000mg  every 3rd day   POTASSIUM CHLORIDE SA (K-DUR,KLOR-CON) 20 MEQ TABLET    Take 20 mEq by mouth every other day. Take 20 meq every third day   PROBIOTIC PRODUCT (PROBIOTIC COLON SUPPORT) CAPS    Take 1 capsule by mouth at bedtime.   RANOLAZINE (RANEXA) 500 MG 12 HR TABLET    Take 500  mg by mouth 2 (two) times daily.   SACUBITRIL-VALSARTAN (ENTRESTO) 24-26 MG    Take 1 tablet by mouth 2 (two) times daily.   SUCRALFATE (CARAFATE) 1 G TABLET    Take 1 g by mouth 2 (two) times daily as needed (esophageal burning).    TIOTROPIUM (SPIRIVA) 18 MCG INHALATION CAPSULE    Place into inhaler and inhale.   VITAMIN D, ERGOCALCIFEROL, (DRISDOL) 50000 UNITS CAPS CAPSULE    Take 50,000 Units by mouth every 7 (seven) days. thursdays  Modified Medications   Modified Medication Previous Medication   CYCLOBENZAPRINE (FLEXERIL) 10 MG TABLET cyclobenzaprine (FLEXERIL) 10 MG tablet      Take 1 tablet (10 mg total) by mouth 3 (three) times daily as needed. for muscle spams    Take 1 tablet (10 mg total) by mouth 3 (three) times daily as needed. for muscle spams   GABAPENTIN (NEURONTIN) 300 MG CAPSULE gabapentin (NEURONTIN) 300 MG capsule      Take 2 capsules (600 mg total) by mouth 3 (three) times daily.    Take 2 capsules (600 mg total) by mouth 3 (three) times daily.   IBUPROFEN (ADVIL,MOTRIN) 800 MG TABLET ibuprofen (ADVIL,MOTRIN) 800 MG tablet      Take 1 tablet (800 mg total) by mouth every 8 (eight) hours as needed.    Take 1 tablet (800 mg total) by mouth every 8 (eight) hours as needed.   OXYCODONE-ACETAMINOPHEN (PERCOCET) 7.5-325 MG TABLET oxyCODONE-acetaminophen (PERCOCET) 7.5-325 MG tablet      Take 1 tablet by mouth every 6 (six) hours as needed for severe pain.    Take 1 tablet by mouth every 6 (six) hours as needed for severe pain.  Discontinued Medications   No medications on file   ----------------------------------------------------------------------------------------------------------------------  Follow-up: Return in about 2 months (around 07/03/2017) for evaluation, med refill.    Molli Barrows, MD

## 2017-05-06 ENCOUNTER — Telehealth: Payer: Self-pay | Admitting: Internal Medicine

## 2017-05-06 ENCOUNTER — Ambulatory Visit: Payer: Medicare Other | Admitting: Internal Medicine

## 2017-05-06 DIAGNOSIS — I493 Ventricular premature depolarization: Secondary | ICD-10-CM

## 2017-05-06 NOTE — Telephone Encounter (Signed)
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 When asked to clarify which metals, she stated, "All of them. I can't wear jewelry or watches or anything without really bad skin reactions." She states she has never actually been allergy tested.   If OK to proceed, the patient requests May 24 for ablation date.   She requests this information sent to Dr. Lovena Le for review and recommendations. She understands Dr. Tanna Furry nurse will call to update.

## 2017-05-06 NOTE — Telephone Encounter (Signed)
New Message:      Pt states she has questions about her upcoming ablation.

## 2017-05-11 NOTE — Telephone Encounter (Signed)
Call returned to Wendy Sanchez.  Notified Wendy Sanchez that Dr. Lovena Le has seen her report of allergy to metals and states that this will not be an issue for patient's ablation.  Wendy Sanchez indicates understanding. Wendy Sanchez scheduled for PVC ablation on Jun 04, 2017 @ 7:30 am. Wendy Sanchez will have labs and pick up instruction letter May 31, 2017. Will create instruction letter.

## 2017-05-14 NOTE — Telephone Encounter (Signed)
CVM left for Pt.  Advised Pt need to have her come in later for PVC ablation on Jun 04, 2017 (arrival time 8:00 am) or move to Jun 02, 2017 first case. Will attempt to call on Monday.

## 2017-05-16 ENCOUNTER — Other Ambulatory Visit: Payer: Self-pay | Admitting: Internal Medicine

## 2017-05-16 NOTE — Telephone Encounter (Signed)
Ok

## 2017-05-17 NOTE — Telephone Encounter (Signed)
Call placed to Pt.  Pt willing to change from first case on Jun 04, 2017 to 9:30 am slot.  Pt will arrive around 8:00 am on procedure day.  Reminded Pt to come in for labs and instruction letter on May 31, 2017.  Instruction letter at front desk.

## 2017-05-31 ENCOUNTER — Other Ambulatory Visit: Payer: Medicare Other | Admitting: *Deleted

## 2017-05-31 DIAGNOSIS — I493 Ventricular premature depolarization: Secondary | ICD-10-CM

## 2017-05-31 LAB — CBC WITH DIFFERENTIAL/PLATELET
BASOS: 1 %
Basophils Absolute: 0 10*3/uL (ref 0.0–0.2)
EOS (ABSOLUTE): 0.1 10*3/uL (ref 0.0–0.4)
EOS: 2 %
Hematocrit: 39.7 % (ref 34.0–46.6)
Hemoglobin: 13.4 g/dL (ref 11.1–15.9)
Immature Grans (Abs): 0 10*3/uL (ref 0.0–0.1)
Immature Granulocytes: 0 %
LYMPHS: 36 %
Lymphocytes Absolute: 1.4 10*3/uL (ref 0.7–3.1)
MCH: 29.5 pg (ref 26.6–33.0)
MCHC: 33.8 g/dL (ref 31.5–35.7)
MCV: 87 fL (ref 79–97)
Monocytes Absolute: 0.6 10*3/uL (ref 0.1–0.9)
Monocytes: 16 %
NEUTROS PCT: 45 %
Neutrophils Absolute: 1.7 10*3/uL (ref 1.4–7.0)
PLATELETS: 283 10*3/uL (ref 150–450)
RBC: 4.54 x10E6/uL (ref 3.77–5.28)
RDW: 15.5 % — ABNORMAL HIGH (ref 12.3–15.4)
WBC: 3.7 10*3/uL (ref 3.4–10.8)

## 2017-05-31 LAB — BASIC METABOLIC PANEL
BUN/Creatinine Ratio: 11 — ABNORMAL LOW (ref 12–28)
BUN: 10 mg/dL (ref 8–27)
CO2: 22 mmol/L (ref 20–29)
CREATININE: 0.91 mg/dL (ref 0.57–1.00)
Calcium: 9.3 mg/dL (ref 8.7–10.3)
Chloride: 103 mmol/L (ref 96–106)
GFR calc Af Amer: 76 mL/min/{1.73_m2} (ref >59)
GFR, EST NON AFRICAN AMERICAN: 66 mL/min/{1.73_m2} (ref >59)
Glucose: 97 mg/dL (ref 65–99)
POTASSIUM: 3.6 mmol/L (ref 3.5–5.2)
Sodium: 141 mmol/L (ref 134–144)

## 2017-06-01 ENCOUNTER — Telehealth: Payer: Self-pay | Admitting: Internal Medicine

## 2017-06-01 NOTE — Telephone Encounter (Signed)
New Message:      Pt is calling to get some instructions on when to stop what medication.

## 2017-06-01 NOTE — Telephone Encounter (Signed)
Spoke with Pt.  Gave verbal instructions for ablation over the phone.   Pt indicates understanding.

## 2017-06-04 ENCOUNTER — Ambulatory Visit (HOSPITAL_COMMUNITY)
Admission: RE | Admit: 2017-06-04 | Discharge: 2017-06-04 | Disposition: A | Discharge status: Home / Self Care | Payer: Medicare Other | Source: Ambulatory Visit | Attending: Internal Medicine | Admitting: Internal Medicine

## 2017-06-04 ENCOUNTER — Encounter (HOSPITAL_COMMUNITY)
Admission: RE | Disposition: A | Discharge status: Home / Self Care | Payer: Self-pay | Source: Ambulatory Visit | Attending: Internal Medicine

## 2017-06-04 DIAGNOSIS — J45909 Unspecified asthma, uncomplicated: Secondary | ICD-10-CM | POA: Insufficient documentation

## 2017-06-04 DIAGNOSIS — Z883 Allergy status to other anti-infective agents status: Secondary | ICD-10-CM | POA: Diagnosis not present

## 2017-06-04 DIAGNOSIS — I110 Hypertensive heart disease with heart failure: Secondary | ICD-10-CM | POA: Diagnosis not present

## 2017-06-04 DIAGNOSIS — I428 Other cardiomyopathies: Secondary | ICD-10-CM | POA: Diagnosis not present

## 2017-06-04 DIAGNOSIS — M797 Fibromyalgia: Secondary | ICD-10-CM | POA: Insufficient documentation

## 2017-06-04 DIAGNOSIS — F17210 Nicotine dependence, cigarettes, uncomplicated: Secondary | ICD-10-CM | POA: Insufficient documentation

## 2017-06-04 DIAGNOSIS — K219 Gastro-esophageal reflux disease without esophagitis: Secondary | ICD-10-CM | POA: Insufficient documentation

## 2017-06-04 DIAGNOSIS — Z7982 Long term (current) use of aspirin: Secondary | ICD-10-CM | POA: Insufficient documentation

## 2017-06-04 DIAGNOSIS — I493 Ventricular premature depolarization: Secondary | ICD-10-CM | POA: Diagnosis present

## 2017-06-04 DIAGNOSIS — I5022 Chronic systolic (congestive) heart failure: Secondary | ICD-10-CM | POA: Diagnosis not present

## 2017-06-04 DIAGNOSIS — Z9884 Bariatric surgery status: Secondary | ICD-10-CM | POA: Diagnosis not present

## 2017-06-04 DIAGNOSIS — Z8249 Family history of ischemic heart disease and other diseases of the circulatory system: Secondary | ICD-10-CM | POA: Diagnosis not present

## 2017-06-04 DIAGNOSIS — M1990 Unspecified osteoarthritis, unspecified site: Secondary | ICD-10-CM | POA: Diagnosis not present

## 2017-06-04 HISTORY — DX: Ventricular premature depolarization: I49.3

## 2017-06-04 HISTORY — PX: PVC ABLATION: EP1236

## 2017-06-04 SURGERY — PVC ABLATION

## 2017-06-04 MED ORDER — ONDANSETRON HCL 4 MG/2ML IJ SOLN: 4.0000 mg | Freq: Four times a day (QID) | INTRAMUSCULAR | Status: DC | PRN

## 2017-06-04 MED ORDER — FENTANYL CITRATE (PF) 100 MCG/2ML IJ SOLN
INTRAMUSCULAR | Status: DC | PRN
  Administered 2017-06-04: 25 ug via INTRAVENOUS
  Administered 2017-06-04 (×2): 12.5 ug via INTRAVENOUS

## 2017-06-04 MED ORDER — SODIUM CHLORIDE 0.9% FLUSH: 3.0000 mL | INTRAVENOUS | Status: DC | PRN

## 2017-06-04 MED ORDER — SODIUM CHLORIDE 0.9 % IV SOLN
INTRAVENOUS | Status: DC
  Administered 2017-06-04: 08:00:00 via INTRAVENOUS

## 2017-06-04 MED ORDER — MIDAZOLAM HCL 5 MG/5ML IJ SOLN
INTRAMUSCULAR | Status: AC
  Filled 2017-06-04: qty 5

## 2017-06-04 MED ORDER — BUPIVACAINE HCL (PF) 0.25 % IJ SOLN
INTRAMUSCULAR | Status: DC | PRN
  Administered 2017-06-04: 60 mL

## 2017-06-04 MED ORDER — ACETAMINOPHEN 325 MG PO TABS
650.0000 mg | ORAL_TABLET | ORAL | Status: DC | PRN
  Filled 2017-06-04: qty 2

## 2017-06-04 MED ORDER — HEPARIN (PORCINE) IN NACL 2-0.9 UNITS/ML
INTRAMUSCULAR | Status: AC | PRN
  Administered 2017-06-04: 500 mL

## 2017-06-04 MED ORDER — BUPIVACAINE HCL (PF) 0.25 % IJ SOLN
INTRAMUSCULAR | Status: AC
  Filled 2017-06-04: qty 30

## 2017-06-04 MED ORDER — SODIUM CHLORIDE 0.9 % IV SOLN: 250.0000 mL | INTRAVENOUS | Status: DC | PRN

## 2017-06-04 MED ORDER — CARVEDILOL 3.125 MG PO TABS
6.2500 mg | ORAL_TABLET | Freq: Once | ORAL | Status: AC
  Administered 2017-06-04: 6.25 mg via ORAL
  Filled 2017-06-04 (×2): qty 2

## 2017-06-04 MED ORDER — MIDAZOLAM HCL 5 MG/5ML IJ SOLN
INTRAMUSCULAR | Status: DC | PRN
  Administered 2017-06-04: 2 mg via INTRAVENOUS
  Administered 2017-06-04 (×2): 1 mg via INTRAVENOUS

## 2017-06-04 MED ORDER — SODIUM CHLORIDE 0.9% FLUSH: 3.0000 mL | Freq: Two times a day (BID) | INTRAVENOUS | Status: DC

## 2017-06-04 MED ORDER — FENTANYL CITRATE (PF) 100 MCG/2ML IJ SOLN
INTRAMUSCULAR | Status: AC
  Filled 2017-06-04: qty 2

## 2017-06-04 MED ORDER — ISOPROTERENOL HCL 0.2 MG/ML IJ SOLN
INTRAMUSCULAR | Status: AC
  Filled 2017-06-04: qty 5

## 2017-06-04 SURGICAL SUPPLY — 11 items
BAG SNAP BAND KOVER 36X36 (MISCELLANEOUS) ×3 IMPLANT
CATH EZ STEER NAV 4MM D-F CUR (ABLATOR) ×3 IMPLANT
CATH JOSEPHSON QUAD-ALLRED 6FR (CATHETERS) ×6 IMPLANT
PACK EP LATEX FREE (CUSTOM PROCEDURE TRAY) ×2
PACK EP LF (CUSTOM PROCEDURE TRAY) ×1 IMPLANT
PAD DEFIB LIFELINK (PAD) ×3 IMPLANT
PATCH CARTO3 (PAD) ×3 IMPLANT
SHEATH PINNACLE 6F 10CM (SHEATH) ×6 IMPLANT
SHEATH PINNACLE 7F 10CM (SHEATH) ×3 IMPLANT
SHEATH PINNACLE 8F 10CM (SHEATH) ×3 IMPLANT
SHIELD RADPAD SCOOP 12X17 (MISCELLANEOUS) ×3 IMPLANT

## 2017-06-04 NOTE — H&P (Signed)
HPI [name] is referred today by Dr. Renaldo Reel to consider PVC ablation. She is a pleasant 67 yo woman with chronic systolic heart failure, a non-ischemic CM, HTN, and has been treated with maximal medical therapy. She has worn multiple cardiac monitors to assess response to AA drug therapy and always has between 16 and 24K PVC's in 24 hours. She has not had syncope. The hope is that her EF will improve if we can get her PVC's under better control. She has some palpitations but these do not appear to be disabling.  Allergies  Allergen Reactions  . Hydrocodone-Acetaminophen Swelling  . Iodine Anaphylaxis and Swelling  . Other     ALLERGY TO METAL - BLACKENS SKIN AND CAUSES A RASH   . Tape Swelling    Skin comes off.  Paper tape is ok  . Shellfish Allergy Nausea And Vomiting    Episode of GI infection after eating clam chowder. Still eats shrimp and other seafood           Current Outpatient Medications  Medication Sig Dispense Refill  . albuterol (PROVENTIL HFA;VENTOLIN HFA) 108 (90 Base) MCG/ACT inhaler Inhale 2 puffs into the lungs every 6 (six) hours as needed for wheezing or shortness of breath.    Marland Kitchen aspirin EC 81 MG tablet Take 1 tablet (81 mg total) daily by mouth. 90 tablet 3  . carvedilol (COREG) 6.25 MG tablet Take 1 tablet (6.25 mg total) by mouth 2 (two) times daily. 180 tablet 0  . clotrimazole (LOTRIMIN) 1 % cream Apply 1 application topically 2 (two) times daily as needed (rash).     . cyclobenzaprine (FLEXERIL) 10 MG tablet Take 1 tablet (10 mg total) by mouth 3 (three) times daily as needed. for muscle spams 90 tablet 5  . dexlansoprazole (DEXILANT) 60 MG capsule Take 60 mg by mouth daily.    . diclofenac sodium (VOLTAREN) 1 % GEL Apply 2 g topically 4 (four) times daily as needed (pain).     Marland Kitchen diphenhydrAMINE (BENADRYL) 25 mg capsule Take 25 mg by mouth every 8 (eight) hours as needed.    . docusate sodium (COLACE) 100 MG capsule Take 200 mg by mouth  at bedtime.     . folic acid (FOLVITE) 1 MG tablet Take 1 mg by mouth at bedtime.     . furosemide (LASIX) 40 MG tablet Take 2 tablets (80 mg) in the morning & 1 tablet (40 mg) in the afternoon if needed    . gabapentin (NEURONTIN) 300 MG capsule Take 2 capsules (600 mg total) by mouth 3 (three) times daily. 180 capsule 5  . ibuprofen (ADVIL,MOTRIN) 800 MG tablet Take 1 tablet (800 mg total) by mouth every 8 (eight) hours as needed. 90 tablet 5  . levocetirizine (XYZAL) 5 MG tablet Take 5 mg by mouth at bedtime.     . mirabegron ER (MYRBETRIQ) 50 MG TB24 tablet Take 50 mg by mouth daily.    . montelukast (SINGULAIR) 10 MG tablet Take 10 mg by mouth at bedtime.    . Multiple Vitamins-Minerals (ICAPS AREDS 2) CAPS Take 1 capsule by mouth 2 (two) times daily.     . Multiple Vitamins-Minerals (MULTIVITAMIN GUMMIES ADULTS) CHEW Chew 2 tablets by mouth daily.     . nortriptyline (PAMELOR) 50 MG capsule Take 50 mg by mouth at bedtime.    Marland Kitchen nystatin (MYCOSTATIN) 100000 UNIT/ML suspension Take 5 mLs by mouth 4 (four) times daily.    Marland Kitchen nystatin cream (MYCOSTATIN)  Apply 1 application topically 2 (two) times daily.    . Omega-3 Fatty Acids (FISH OIL) 1000 MG CAPS Take 1000mg  every 3rd day    . oxyCODONE-acetaminophen (PERCOCET) 7.5-325 MG tablet Take 1 tablet by mouth every 6 (six) hours as needed for severe pain. 120 tablet 0  . potassium chloride SA (K-DUR,KLOR-CON) 20 MEQ tablet Take 20 mEq by mouth every other day. Take 20 meq every third day    . Probiotic Product (PROBIOTIC COLON SUPPORT) CAPS Take 1 capsule by mouth at bedtime.    . ranolazine (RANEXA) 500 MG 12 hr tablet Take 500 mg by mouth 2 (two) times daily.    . sacubitril-valsartan (ENTRESTO) 24-26 MG Take 1 tablet by mouth 2 (two) times daily. 180 tablet 0  . sucralfate (CARAFATE) 1 G tablet Take 1 g by mouth 2 (two) times daily as needed (esophageal burning).     Marland Kitchen tiotropium (SPIRIVA) 18 MCG inhalation capsule  Place into inhaler and inhale.    . Vitamin D, Ergocalciferol, (DRISDOL) 50000 UNITS CAPS capsule Take 50,000 Units by mouth every 7 (seven) days. thursdays     No current facility-administered medications for this visit.          Past Medical History:  Diagnosis Date  . Allergic rhinitis   . Arthritis   . Asthma    problems with fumes and aerosols cause asthma  . Complication of anesthesia    awakens during surgery; has occurred with last 3-4 surgeries   . Environmental allergies    fumes   . Fibromyalgia   . GERD (gastroesophageal reflux disease)   . History of bronchitis   . Hypertension   . NICM (nonischemic cardiomyopathy) ([place])    a. ? PVC mediated;  b. 06/2016 Echo: EF 25-30%, mild LVH, mild LAE, mild AI/MR/TR/PR; c. 08/2016 Cath: nl cors, EF 25%.  . Numbness    hands bilat when driving; improves when not preforming task   . PVC's (premature ventricular contractions)    a. 11/2016 Amio started;  b. 11/29/16 48h Holter: 16109 PVC's (41%); c. 12/2016 24h Holter: 18040 PVC's (15%); c. 02/2017 48h Holter: 37262 PVC's (41%).  . Status post gastric banding   . Tinnitus    comes and goes   . Vertigo    6 months ago approx     ROS:   All systems reviewed and negative except as noted in the HPI.        Past Surgical History:  Procedure Laterality Date  . ABDOMINAL HYSTERECTOMY  1979   left ovary remains  . ABDOMINAL HYSTERECTOMY    . CHOLECYSTECTOMY    . fibrous tissue removed from right shoulder and back of neck      2 years ago   . GANGLION CYST EXCISION    . GASTRIC BYPASS  1980   states had bypass with banding and band left in -  . KNEE SURGERY Bilateral    both knees  2001  . RIGHT/LEFT HEART CATH AND CORONARY ANGIOGRAPHY Bilateral 08/12/2016   Procedure: Right/Left Heart Cath and Coronary Angiography;  Surgeon: Yolonda Kida, MD;  Location: [facility];  Service: Cardiovascular;  Laterality:  Bilateral;  . TONSILLECTOMY     age 61  . TOTAL KNEE ARTHROPLASTY Left 08/02/2015   Procedure: LEFT TOTAL KNEE ARTHROPLASTY;  Surgeon: Mcarthur Rossetti, MD;  Location: [facility];  Service: Orthopedics;  Laterality: Left;  . TOTAL KNEE ARTHROPLASTY Right 12/13/2015   Procedure: RIGHT TOTAL KNEE ARTHROPLASTY;  Surgeon: Mcarthur Rossetti, MD;  Location: [facility];  Service: Orthopedics;  Laterality: Right;          Family History  Problem Relation Age of Onset  . Hypertension Father   . Deep vein thrombosis Father   . Dementia Mother   . Diabetes Sister   . Congestive Heart Failure Sister   . Congestive Heart Failure Brother   . Congestive Heart Failure Daughter   . Congestive Heart Failure Son   . Breast cancer Maternal Aunt        great aunt     Social History        Socioeconomic History  . Marital status: Single    Spouse name: Not on file  . Number of children: Not on file  . Years of education: Not on file  . Highest education level: Not on file  Occupational History  . Not on file  Social Needs  . Financial resource strain: Not on file  . Food insecurity:    Worry: Not on file    Inability: Not on file  . Transportation needs:    Medical: Not on file    Non-medical: Not on file  Tobacco Use  . Smoking status: Current Some Day Smoker    Packs/day: 0.10    Years: 50.00    Pack years: 5.00    Last attempt to quit: 08/22/2016    Years since quitting: 0.6  . Smokeless tobacco: Former Systems developer    Types: Snuff, Sarina Ser    Quit date: 01/13/1995  Substance and Sexual Activity  . Alcohol use: No    Alcohol/week: 0.0 oz  . Drug use: No  . Sexual activity: Not on file  Lifestyle  . Physical activity:    Days per week: Not on file    Minutes per session: Not on file  . Stress: Not on file  Relationships  . Social connections:    Talks on phone: Not on file    Gets together: Not on file    Attends religious  service: Not on file    Active member of club or organization: Not on file    Attends meetings of clubs or organizations: Not on file    Relationship status: Not on file  . Intimate partner violence:    Fear of current or ex partner: Not on file    Emotionally abused: Not on file    Physically abused: Not on file    Forced sexual activity: Not on file  Other Topics Concern  . Not on file  Social History Narrative  . Not on file     BP 122/70   Pulse 80   Ht 5\' 7"  (1.702 m)   Wt (!) 302 lb (137 kg)   BMI 47.30 kg/m   Physical Exam:  Well appearing NAD HEENT: Unremarkable Neck:  No JVD, no thyromegally Lymphatics:  No adenopathy Back:  No CVA tenderness Lungs:  Clear with no wheezes HEART:  IRegular rate rhythm, no murmurs, no rubs, no clicks Abd:  soft, obese, positive bowel sounds, no organomegally, no rebound, no guarding Ext:  2 plus pulses, no edema, no cyanosis, no clubbing Skin:  No rashes no nodules Neuro:  CN II through XII intact, motor grossly intact  EKG - NSR with frequent PVC's, multifocal   Assess/Plan: 1. PVC's - She has failed medical therapy although she has not been treated with amiodarone. I have reviewed her heart monitors. She appears to have a dominant PVC morphology  and we will attempt to target this and perhaps one other during EP study and catheter ablation. I do not plan to use general anesthesia out of concerns for suppressing the ectopy. I have discussed the risks/benefits/goals/expectations of catheter ablation and she wishes to proceed.  I spent 30 minutes discussing producing her note including 50% face to face time.  Ponciano Ort.  EP Attending  Patient seen and examined. Agree with above. No change. We will plan to proceed with EP study and PVC ablation. I have again reviewed the risks/benefits/goals/expectations and she wishes to proceed.  Mikle Bosworth.D.

## 2017-06-04 NOTE — Progress Notes (Signed)
Dr Lovena Le in to see and okay to d/c home per Dr Lovena Le; client up and walked and tolerated well; right groin stable, no bleeding or hematoma

## 2017-06-04 NOTE — Progress Notes (Addendum)
Site area: Right groin a 6 french venous sheath X2 and 8 french venous sheath was removed  Site Prior to Removal:  Level 0  Pressure Applied For 20 MINUTES    Bedrest Beginning at 1300p  Manual:   Yes.    Patient Status During Pull:  stable  Post Pull Groin Site:  Level 0  Post Pull Instructions Given:  Yes.    Post Pull Pulses Present:  Yes.    Dressing Applied:  Yes.    Comments:  VS remain stable

## 2017-06-04 NOTE — Discharge Instructions (Signed)
No driving for 4 days. No lifting over 5 lbs for 1 week. No sexual activity for 1 week. You may return to work in 1 week. Keep procedure site clean & dry. If you notice increased pain, swelling, bleeding or pus, call/return!  You may shower, but no soaking baths/hot tubs/pools for 1 week.  ° ° °

## 2017-06-08 ENCOUNTER — Encounter (HOSPITAL_COMMUNITY): Payer: Self-pay | Admitting: Internal Medicine

## 2017-06-16 ENCOUNTER — Telehealth: Payer: Self-pay | Admitting: Internal Medicine

## 2017-06-16 NOTE — Telephone Encounter (Signed)
New message    Patient calling with concerns about yeast. States she was given an antibiotic by Dr Lovena Le after procedure  Here PCP advised her to contact Dr Lovena Le to request medication for bacterial infection.

## 2017-06-16 NOTE — Telephone Encounter (Signed)
Returned call to Pt.  Advised Pt it does not appear she received an antibiotic during her hospitalization for ablation. Advised pt to call PCP to receive treatment for thrush. Pt indicates understanding.

## 2017-06-21 ENCOUNTER — Other Ambulatory Visit: Payer: Self-pay | Admitting: Anesthesiology

## 2017-06-30 ENCOUNTER — Encounter: Payer: Self-pay | Admitting: Anesthesiology

## 2017-06-30 ENCOUNTER — Ambulatory Visit: Payer: Medicare Other | Attending: Anesthesiology | Admitting: Anesthesiology

## 2017-06-30 VITALS — BP 110/90 | HR 85 | Temp 98.3°F | Resp 16 | Ht 67.0 in | Wt 301.0 lb

## 2017-06-30 DIAGNOSIS — Z981 Arthrodesis status: Secondary | ICD-10-CM | POA: Insufficient documentation

## 2017-06-30 DIAGNOSIS — F1190 Opioid use, unspecified, uncomplicated: Secondary | ICD-10-CM

## 2017-06-30 DIAGNOSIS — M542 Cervicalgia: Secondary | ICD-10-CM | POA: Diagnosis not present

## 2017-06-30 DIAGNOSIS — M5136 Other intervertebral disc degeneration, lumbar region: Secondary | ICD-10-CM | POA: Diagnosis not present

## 2017-06-30 DIAGNOSIS — M19011 Primary osteoarthritis, right shoulder: Secondary | ICD-10-CM | POA: Diagnosis not present

## 2017-06-30 DIAGNOSIS — M25551 Pain in right hip: Secondary | ICD-10-CM | POA: Diagnosis not present

## 2017-06-30 DIAGNOSIS — Z9889 Other specified postprocedural states: Secondary | ICD-10-CM | POA: Insufficient documentation

## 2017-06-30 DIAGNOSIS — M25511 Pain in right shoulder: Secondary | ICD-10-CM | POA: Insufficient documentation

## 2017-06-30 DIAGNOSIS — M25562 Pain in left knee: Secondary | ICD-10-CM | POA: Insufficient documentation

## 2017-06-30 DIAGNOSIS — Z7982 Long term (current) use of aspirin: Secondary | ICD-10-CM | POA: Insufficient documentation

## 2017-06-30 DIAGNOSIS — I4891 Unspecified atrial fibrillation: Secondary | ICD-10-CM | POA: Diagnosis not present

## 2017-06-30 DIAGNOSIS — M5387 Other specified dorsopathies, lumbosacral region: Secondary | ICD-10-CM | POA: Diagnosis not present

## 2017-06-30 DIAGNOSIS — G894 Chronic pain syndrome: Secondary | ICD-10-CM | POA: Diagnosis not present

## 2017-06-30 DIAGNOSIS — M25532 Pain in left wrist: Secondary | ICD-10-CM | POA: Diagnosis not present

## 2017-06-30 DIAGNOSIS — M5431 Sciatica, right side: Secondary | ICD-10-CM | POA: Diagnosis not present

## 2017-06-30 DIAGNOSIS — M48062 Spinal stenosis, lumbar region with neurogenic claudication: Secondary | ICD-10-CM

## 2017-06-30 DIAGNOSIS — M25561 Pain in right knee: Secondary | ICD-10-CM | POA: Diagnosis present

## 2017-06-30 DIAGNOSIS — G8929 Other chronic pain: Secondary | ICD-10-CM | POA: Diagnosis not present

## 2017-06-30 DIAGNOSIS — M1712 Unilateral primary osteoarthritis, left knee: Secondary | ICD-10-CM | POA: Insufficient documentation

## 2017-06-30 MED ORDER — OXYCODONE-ACETAMINOPHEN 7.5-325 MG PO TABS: 1.0000 | ORAL_TABLET | Freq: Four times a day (QID) | ORAL | 0 refills | Status: DC | PRN

## 2017-06-30 NOTE — Progress Notes (Signed)
Subjective:  Patient ID: Wendy Sanchez, female    DOB: Jun 19, 1950  Age: 67 y.o. MRN: [id number]  CC: Shoulder Pain (right shoulder); Back Pain (lower bilateral); Knee Pain (bilateral.  s/p joint replacements ); Wrist Pain (left ); and Neck Pain (s/p fusion)   Procedure: None  HPI Wendy Sanchez presents for reevaluation.  She was last seen 2 months ago and has been doing well.  She had a recent cardio ablation for her A. fib and is feeling better with more activity and energy.  She is also sleeping well.  She is taking her medications as prescribed for her low back pain and diffuse body pain.  These continue to work well based on the narcotic assessment sheet.  No significant side effects or mention and she continues to derive good functional lifestyle improvement with her meds.  No change in lower extremity strength or function is noted at this time.  She is hoping to pursue water aerobics as before to assist with weight loss and core strengthening.  Outpatient Medications Prior to Visit  Medication Sig Dispense Refill  . albuterol (PROVENTIL HFA;VENTOLIN HFA) 108 (90 Base) MCG/ACT inhaler Inhale 2 puffs into the lungs every 6 (six) hours as needed for wheezing or shortness of breath.    Marland Kitchen aspirin EC 81 MG tablet Take 1 tablet (81 mg total) daily by mouth. 90 tablet 3  . carvedilol (COREG) 6.25 MG tablet TAKE 1 TABLET BY MOUTH TWO  TIMES DAILY 180 tablet 3  . cyclobenzaprine (FLEXERIL) 10 MG tablet Take 1 tablet (10 mg total) by mouth 3 (three) times daily as needed. for muscle spams 270 tablet 1  . dexlansoprazole (DEXILANT) 60 MG capsule Take 60 mg by mouth daily.    . diclofenac sodium (VOLTAREN) 1 % GEL Apply 2 g topically 4 (four) times daily as needed (pain).     Marland Kitchen diphenhydrAMINE (BENADRYL) 25 mg capsule Take 25 mg by mouth every 8 (eight) hours as needed for allergies.     Marland Kitchen docusate sodium (COLACE) 100 MG capsule Take 200 mg by mouth at bedtime.     Marland Kitchen ENTRESTO 24-26 MG TAKE 1  TABLET BY MOUTH TWO  TIMES DAILY 195 tablet 0  . folic acid (FOLVITE) 1 MG tablet Take 1 mg by mouth at bedtime.     . furosemide (LASIX) 40 MG tablet Take 40 mg by mouth 2 (two) times daily. Take  tablets (40 mg) in the morning & 1 tablet (40 mg) in the afternoon    . gabapentin (NEURONTIN) 300 MG capsule Take 2 capsules (600 mg total) by mouth 3 (three) times daily. 540 capsule 0  . ibuprofen (ADVIL,MOTRIN) 800 MG tablet Take 1 tablet (800 mg total) by mouth every 8 (eight) hours as needed. 270 tablet 1  . levocetirizine (XYZAL) 5 MG tablet Take 5 mg by mouth at bedtime.     . mirabegron ER (MYRBETRIQ) 50 MG TB24 tablet Take 50 mg by mouth daily.    . montelukast (SINGULAIR) 10 MG tablet Take 10 mg by mouth at bedtime.    . Multiple Vitamins-Minerals (MULTIVITAMIN GUMMIES ADULTS) CHEW Chew 2 tablets by mouth daily.     . nortriptyline (PAMELOR) 50 MG capsule Take 50 mg by mouth at bedtime.    Marland Kitchen nystatin (MYCOSTATIN) 100000 UNIT/ML suspension Take 5 mLs by mouth 4 (four) times daily as needed (thrush).     . nystatin cream (MYCOSTATIN) Apply 1 application topically 2 (two) times daily as needed (irritation).     Marland Kitchen  Omega-3 Fatty Acids (FISH OIL) 1000 MG CAPS Take 1 capsule by mouth every 3 (three) days. Take 1000mg  every 3rd day     . potassium chloride SA (K-DUR,KLOR-CON) 20 MEQ tablet Take 20 mEq by mouth See admin instructions. Take 20 meq every third day    . Probiotic Product (PROBIOTIC COLON SUPPORT) CAPS Take 1 capsule by mouth at bedtime.    . sucralfate (CARAFATE) 1 G tablet Take 1 g by mouth 2 (two) times daily as needed (esophageal burning).     Marland Kitchen tiotropium (SPIRIVA) 18 MCG inhalation capsule Place 18 mcg into inhaler and inhale daily.     . Vitamin D, Ergocalciferol, (DRISDOL) 50000 UNITS CAPS capsule Take 50,000 Units by mouth every 7 (seven) days. thursdays    . oxyCODONE-acetaminophen (PERCOCET) 7.5-325 MG tablet Take 1 tablet by mouth every 6 (six) hours as needed for severe pain.  120 tablet 0   No facility-administered medications prior to visit.     Review of Systems CNS: No confusion or sedation Cardiac: No angina or palpitations GI: No abdominal pain or constipation Institution: No nausea vomiting fevers or chills  Objective:  BP 110/90 (BP Location: Left Arm, Patient Position: Sitting, Cuff Size: Large)   Pulse 85   Temp 98.3 F (36.8 C) (Oral)   Resp 16   Ht 5\' 7"  (1.702 m)   Wt (!) 301 lb (136.5 kg)   SpO2 99%   BMI 47.14 kg/m    BP Readings from Last 3 Encounters:  06/30/17 110/90  06/04/17 138/81  05/03/17 134/85     Wt Readings from Last 3 Encounters:  06/30/17 (!) 301 lb (136.5 kg)  06/04/17 (!) 301 lb (136.5 kg)  05/03/17 (!) 301 lb (136.5 kg)     Physical Exam Pt is alert and oriented PERRL EOMI HEART IS RRR no murmur or rub LCTA no wheezing or rales MUSCULOSKELETAL reveals good muscle tone and bulk to the lower extremities without change from previous evaluation.  No lumbar paraspinous trigger points are noted.  She walks with an antalgic gait.  Labs  No results found for: HGBA1C Lab Results  Component Value Date   LDLCALC (H) 03/16/2007    150        Total Cholesterol/HDL:CHD Risk Coronary Heart Disease Risk Table                     Men   Women  1/2 Average Risk   3.4   3.3   CREATININE 0.91 05/31/2017    -------------------------------------------------------------------------------------------------------------------- Lab Results  Component Value Date   WBC 3.7 05/31/2017   HGB 13.4 05/31/2017   HCT 39.7 05/31/2017   PLT 283 05/31/2017   GLUCOSE 97 05/31/2017   CHOL (H) 03/16/2007    236        ATP III CLASSIFICATION:  <200     mg/dL   Desirable  200-239  mg/dL   Borderline High  >=240    mg/dL   High   TRIG 76 03/16/2007   HDL 71 03/16/2007   LDLCALC (H) 03/16/2007    150        Total Cholesterol/HDL:CHD Risk Coronary Heart Disease Risk Table                     Men   Women  1/2 Average Risk    3.4   3.3   ALT 11 02/25/2017   AST 13 02/25/2017   NA 141 05/31/2017   K 3.6  05/31/2017   CL 103 05/31/2017   CREATININE 0.91 05/31/2017   BUN 10 05/31/2017   CO2 22 05/31/2017   TSH 1.820 02/25/2017   INR 1.11 07/26/2015    --------------------------------------------------------------------------------------------------------------------- No results found.   Assessment & Plan:   Wendy Sanchez was seen today for shoulder pain, back pain, knee pain, wrist pain and neck pain.  Diagnoses and all orders for this visit:  Chronic pain syndrome  Chronic, continuous use of opioids -     Discontinue: oxyCODONE-acetaminophen (PERCOCET) 7.5-325 MG tablet; Take 1 tablet by mouth every 6 (six) hours as needed for severe pain. -     oxyCODONE-acetaminophen (PERCOCET) 7.5-325 MG tablet; Take 1 tablet by mouth every 6 (six) hours as needed for severe pain.  Primary osteoarthritis of right shoulder  Chronic pain of right hip  Spinal stenosis of lumbar region with neurogenic claudication  Primary osteoarthritis of left knee  DDD (degenerative disc disease), lumbar  Sciatica of right side associated with disorder of lumbosacral spine        ----------------------------------------------------------------------------------------------------------------------  Problem List Items Addressed This Visit      Unprioritized   Chronic pain syndrome - Primary   Osteoarthritis of left knee   Relevant Medications   oxyCODONE-acetaminophen (PERCOCET) 7.5-325 MG tablet   Spinal stenosis of lumbar region    Other Visit Diagnoses    Chronic, continuous use of opioids       Relevant Medications   oxyCODONE-acetaminophen (PERCOCET) 7.5-325 MG tablet   Primary osteoarthritis of right shoulder       Relevant Medications   oxyCODONE-acetaminophen (PERCOCET) 7.5-325 MG tablet   Chronic pain of right hip       Relevant Medications   oxyCODONE-acetaminophen (PERCOCET) 7.5-325 MG tablet   DDD  (degenerative disc disease), lumbar       Relevant Medications   oxyCODONE-acetaminophen (PERCOCET) 7.5-325 MG tablet   Sciatica of right side associated with disorder of lumbosacral spine            ----------------------------------------------------------------------------------------------------------------------  1. Chronic pain syndrome   2. Chronic, continuous use of opioids We have reviewed the [facility] practitioner database information and it is appropriate.  Refills will be given for her oxycodone 7.5 mg tablets for July 1 in July 31.  She is to return to clinic in 2 months for reevaluation. - oxyCODONE-acetaminophen (PERCOCET) 7.5-325 MG tablet; Take 1 tablet by mouth every 6 (six) hours as needed for severe pain.  Dispense: 120 tablet; Refill: 0  3. Primary osteoarthritis of right shoulder As above  4. Chronic pain of right hip As above  5. Spinal stenosis of lumbar region with neurogenic claudication Tinea with core stretching strengthening exercises once again reviewed today.  Continue with water aerobics as tolerated  6. Primary osteoarthritis of left knee Continue with efforts at weight loss  7. DDD (degenerative disc disease), lumbar   8. Sciatica of right side associated with disorder of lumbosacral spine     ----------------------------------------------------------------------------------------------------------------------  I am having Wendy Sanchez maintain her dexlansoprazole, sucralfate, montelukast, diclofenac sodium, folic acid, mirabegron ER, potassium chloride SA, Vitamin D (Ergocalciferol), PROBIOTIC COLON SUPPORT, MULTIVITAMIN GUMMIES ADULTS, nortriptyline, levocetirizine, docusate sodium, albuterol, Fish Oil, tiotropium, aspirin EC, diphenhydrAMINE, furosemide, nystatin, nystatin cream, gabapentin, ibuprofen, cyclobenzaprine, ENTRESTO, carvedilol, and oxyCODONE-acetaminophen.   Meds ordered this encounter  Medications  . DISCONTD:  oxyCODONE-acetaminophen (PERCOCET) 7.5-325 MG tablet    Sig: Take 1 tablet by mouth every 6 (six) hours as needed for severe pain.    Dispense:  120 tablet    Refill:  0    Do not fill until [id number]  . oxyCODONE-acetaminophen (PERCOCET) 7.5-325 MG tablet    Sig: Take 1 tablet by mouth every 6 (six) hours as needed for severe pain.    Dispense:  120 tablet    Refill:  0    Do not fill until [id number]   Patient's Medications  New Prescriptions   No medications on file  Previous Medications   ALBUTEROL (PROVENTIL HFA;VENTOLIN HFA) 108 (90 BASE) MCG/ACT INHALER    Inhale 2 puffs into the lungs every 6 (six) hours as needed for wheezing or shortness of breath.   ASPIRIN EC 81 MG TABLET    Take 1 tablet (81 mg total) daily by mouth.   CARVEDILOL (COREG) 6.25 MG TABLET    TAKE 1 TABLET BY MOUTH TWO  TIMES DAILY   CYCLOBENZAPRINE (FLEXERIL) 10 MG TABLET    Take 1 tablet (10 mg total) by mouth 3 (three) times daily as needed. for muscle spams   DEXLANSOPRAZOLE (DEXILANT) 60 MG CAPSULE    Take 60 mg by mouth daily.   DICLOFENAC SODIUM (VOLTAREN) 1 % GEL    Apply 2 g topically 4 (four) times daily as needed (pain).    DIPHENHYDRAMINE (BENADRYL) 25 MG CAPSULE    Take 25 mg by mouth every 8 (eight) hours as needed for allergies.    DOCUSATE SODIUM (COLACE) 100 MG CAPSULE    Take 200 mg by mouth at bedtime.    ENTRESTO 24-26 MG    TAKE 1 TABLET BY MOUTH TWO  TIMES DAILY   FOLIC ACID (FOLVITE) 1 MG TABLET    Take 1 mg by mouth at bedtime.    FUROSEMIDE (LASIX) 40 MG TABLET    Take 40 mg by mouth 2 (two) times daily. Take  tablets (40 mg) in the morning & 1 tablet (40 mg) in the afternoon   GABAPENTIN (NEURONTIN) 300 MG CAPSULE    Take 2 capsules (600 mg total) by mouth 3 (three) times daily.   IBUPROFEN (ADVIL,MOTRIN) 800 MG TABLET    Take 1 tablet (800 mg total) by mouth every 8 (eight) hours as needed.   LEVOCETIRIZINE (XYZAL) 5 MG TABLET    Take 5 mg by mouth at bedtime.    MIRABEGRON ER (MYRBETRIQ)  50 MG TB24 TABLET    Take 50 mg by mouth daily.   MONTELUKAST (SINGULAIR) 10 MG TABLET    Take 10 mg by mouth at bedtime.   MULTIPLE VITAMINS-MINERALS (MULTIVITAMIN GUMMIES ADULTS) CHEW    Chew 2 tablets by mouth daily.    NORTRIPTYLINE (PAMELOR) 50 MG CAPSULE    Take 50 mg by mouth at bedtime.   NYSTATIN (MYCOSTATIN) 100000 UNIT/ML SUSPENSION    Take 5 mLs by mouth 4 (four) times daily as needed (thrush).    NYSTATIN CREAM (MYCOSTATIN)    Apply 1 application topically 2 (two) times daily as needed (irritation).    OMEGA-3 FATTY ACIDS (FISH OIL) 1000 MG CAPS    Take 1 capsule by mouth every 3 (three) days. Take 1000mg  every 3rd day    POTASSIUM CHLORIDE SA (K-DUR,KLOR-CON) 20 MEQ TABLET    Take 20 mEq by mouth See admin instructions. Take 20 meq every third day   PROBIOTIC PRODUCT (PROBIOTIC COLON SUPPORT) CAPS    Take 1 capsule by mouth at bedtime.   SUCRALFATE (CARAFATE) 1 G TABLET    Take 1 g by mouth 2 (two) times daily as needed (esophageal  burning).    TIOTROPIUM (SPIRIVA) 18 MCG INHALATION CAPSULE    Place 18 mcg into inhaler and inhale daily.    VITAMIN D, ERGOCALCIFEROL, (DRISDOL) 50000 UNITS CAPS CAPSULE    Take 50,000 Units by mouth every 7 (seven) days. thursdays  Modified Medications   Modified Medication Previous Medication   OXYCODONE-ACETAMINOPHEN (PERCOCET) 7.5-325 MG TABLET oxyCODONE-acetaminophen (PERCOCET) 7.5-325 MG tablet      Take 1 tablet by mouth every 6 (six) hours as needed for severe pain.    Take 1 tablet by mouth every 6 (six) hours as needed for severe pain.  Discontinued Medications   No medications on file   ----------------------------------------------------------------------------------------------------------------------  Follow-up: Return in about 2 months (around 08/30/2017) for evaluation, med refill.    Molli Barrows, MD

## 2017-06-30 NOTE — Progress Notes (Signed)
Nursing Pain Medication Assessment:  Safety precautions to be maintained throughout the outpatient stay will include: orient to surroundings, keep bed in low position, maintain call bell within reach at all times, provide assistance with transfer out of bed and ambulation.  Medication Inspection Compliance: Pill count conducted under aseptic conditions, in front of the patient. Neither the pills nor the bottle was removed from the patient's sight at any time. Once count was completed pills were immediately returned to the patient in their original bottle.  Medication: Hydrocodone/APAP Pill/Patch Count: 43 of 120 pills remain Pill/Patch Appearance: Markings consistent with prescribed medication Bottle Appearance: Standard pharmacy container. Clearly labeled. Filled Date: 06 / 01 / 2019 Last Medication intake:  Today

## 2017-06-30 NOTE — Progress Notes (Deleted)
Nursing Pain Medication Assessment:  Safety precautions to be maintained throughout the outpatient stay will include: orient to surroundings, keep bed in low position, maintain call bell within reach at all times, provide assistance with transfer out of bed and ambulation.  Medication Inspection Compliance: Wendy Sanchez did not comply with our request to bring her pills to be counted. She was reminded that bringing the medication bottles, even when empty, is a requirement.  Medication: None brought in. Pill/Patch Count: None available to be counted. Bottle Appearance: No container available. Did not bring bottle(s) to appointment. Filled Date: N/A Last Medication intake:  Today

## 2017-07-02 ENCOUNTER — Ambulatory Visit: Payer: Medicare Other | Admitting: Internal Medicine

## 2017-07-02 ENCOUNTER — Encounter: Payer: Self-pay | Admitting: Internal Medicine

## 2017-07-02 VITALS — BP 118/74 | HR 78 | Ht 67.0 in | Wt 302.0 lb

## 2017-07-02 DIAGNOSIS — I428 Other cardiomyopathies: Secondary | ICD-10-CM | POA: Diagnosis not present

## 2017-07-02 DIAGNOSIS — I5022 Chronic systolic (congestive) heart failure: Secondary | ICD-10-CM | POA: Diagnosis not present

## 2017-07-02 DIAGNOSIS — I493 Ventricular premature depolarization: Secondary | ICD-10-CM

## 2017-07-02 NOTE — Patient Instructions (Addendum)
Medication Instructions:  Your physician recommends that you continue on your current medications as directed. Please refer to the Current Medication list given to you today.  Labwork: None ordered.  Testing/Procedures: None ordered.  Follow-Up: Your physician wants you to follow-up in: as needed with Dr. Taylor.      Any Other Special Instructions Will Be Listed Below (If Applicable).  If you need a refill on your cardiac medications before your next appointment, please call your pharmacy.   

## 2017-07-02 NOTE — Progress Notes (Signed)
HPI Wendy Sanchez returns today for followup. She is a pleasant obese woman with dense PVC's who underwent EP Study and abaltion several weeks ago. She was found to have her clinical PVC very close to the his bundle which limited the amount of RF energy we could deliver. Her PVC"s were markedly reduce. She now has rare palpitation.  Allergies  Allergen Reactions  . Hydrocodone-Acetaminophen Swelling  . Iodine Anaphylaxis and Swelling  . Other Other (See Comments)    ALLERGY TO METAL - BLACKENS SKIN AND CAUSES A RASH   . Tape Swelling and Other (See Comments)    Skin comes off.  Paper tape is ok  . Shellfish Allergy Nausea And Vomiting and Other (See Comments)    Episode of GI infection after eating clam chowder. Still eats shrimp and other seafood     Current Outpatient Medications  Medication Sig Dispense Refill  . albuterol (PROVENTIL HFA;VENTOLIN HFA) 108 (90 Base) MCG/ACT inhaler Inhale 2 puffs into the lungs every 6 (six) hours as needed for wheezing or shortness of breath.    Marland Kitchen aspirin EC 81 MG tablet Take 1 tablet (81 mg total) daily by mouth. 90 tablet 3  . carvedilol (COREG) 6.25 MG tablet TAKE 1 TABLET BY MOUTH TWO  TIMES DAILY 180 tablet 3  . cyclobenzaprine (FLEXERIL) 10 MG tablet Take 1 tablet (10 mg total) by mouth 3 (three) times daily as needed. for muscle spams 270 tablet 1  . dexlansoprazole (DEXILANT) 60 MG capsule Take 60 mg by mouth daily.    . diclofenac sodium (VOLTAREN) 1 % GEL Apply 2 g topically 4 (four) times daily as needed (pain).     Marland Kitchen diphenhydrAMINE (BENADRYL) 25 mg capsule Take 25 mg by mouth every 8 (eight) hours as needed for allergies.     Marland Kitchen docusate sodium (COLACE) 100 MG capsule Take 200 mg by mouth at bedtime.     Marland Kitchen ENTRESTO 24-26 MG TAKE 1 TABLET BY MOUTH TWO  TIMES DAILY 277 tablet 0  . folic acid (FOLVITE) 1 MG tablet Take 1 mg by mouth at bedtime.     . furosemide (LASIX) 40 MG tablet Take 40 mg by mouth 2 (two) times daily. Take  tablets  (40 mg) in the morning & 1 tablet (40 mg) in the afternoon    . gabapentin (NEURONTIN) 300 MG capsule Take 2 capsules (600 mg total) by mouth 3 (three) times daily. 540 capsule 0  . ibuprofen (ADVIL,MOTRIN) 800 MG tablet Take 1 tablet (800 mg total) by mouth every 8 (eight) hours as needed. 270 tablet 1  . levocetirizine (XYZAL) 5 MG tablet Take 5 mg by mouth at bedtime.     . mirabegron ER (MYRBETRIQ) 50 MG TB24 tablet Take 50 mg by mouth daily.    . montelukast (SINGULAIR) 10 MG tablet Take 10 mg by mouth at bedtime.    . Multiple Vitamins-Minerals (MULTIVITAMIN GUMMIES ADULTS) CHEW Chew 2 tablets by mouth daily.     . nortriptyline (PAMELOR) 50 MG capsule Take 50 mg by mouth at bedtime.    Marland Kitchen nystatin (MYCOSTATIN) 100000 UNIT/ML suspension Take 5 mLs by mouth 4 (four) times daily as needed (thrush).     . nystatin cream (MYCOSTATIN) Apply 1 application topically 2 (two) times daily as needed (irritation).     . Omega-3 Fatty Acids (FISH OIL) 1000 MG CAPS Take 1 capsule by mouth every 3 (three) days. Take 1000mg  every 3rd day     .  oxyCODONE-acetaminophen (PERCOCET) 7.5-325 MG tablet Take 1 tablet by mouth every 6 (six) hours as needed for severe pain. 120 tablet 0  . potassium chloride SA (K-DUR,KLOR-CON) 20 MEQ tablet Take 20 mEq by mouth See admin instructions. Take 20 meq every third day    . Probiotic Product (PROBIOTIC COLON SUPPORT) CAPS Take 1 capsule by mouth at bedtime.    . sucralfate (CARAFATE) 1 G tablet Take 1 g by mouth 2 (two) times daily as needed (esophageal burning).     Marland Kitchen tiotropium (SPIRIVA) 18 MCG inhalation capsule Place 18 mcg into inhaler and inhale daily.     . Vitamin D, Ergocalciferol, (DRISDOL) 50000 UNITS CAPS capsule Take 50,000 Units by mouth every 7 (seven) days. thursdays     No current facility-administered medications for this visit.      Past Medical History:  Diagnosis Date  . Allergic rhinitis   . Arthritis   . Asthma    problems with fumes and  aerosols cause asthma  . Chest pain 10/19/2016  . Complication of anesthesia    awakens during surgery; has occurred with last 3-4 surgeries   . Environmental allergies    fumes   . Fibromyalgia   . GERD (gastroesophageal reflux disease)   . History of bronchitis   . Hx of total knee arthroplasty 12/13/2015  . Hypertension   . Morbid obesity with BMI of 45.0-49.9, adult ([place]) 07/25/2015  . NICM (nonischemic cardiomyopathy) ([place])    a. ? PVC mediated;  b. 06/2016 Echo: EF 25-30%, mild LVH, mild LAE, mild AI/MR/TR/PR; c. 08/2016 Cath: nl cors, EF 25%.  . Numbness    hands bilat when driving; improves when not preforming task   . Osteoarthritis of left knee 08/02/2015  . Pes anserinus bursitis of left knee 05/18/2016  . Presence of right artificial knee joint 05/18/2016  . PVC (premature ventricular contraction) 06/04/2017  . PVC's (premature ventricular contractions)    a. 11/2016 [place]io started;  b. 11/29/16 48h Holter: 22025 PVC's (41%); c. 12/2016 24h Holter: 18040 PVC's (15%); c. 02/2017 48h Holter: 37262 PVC's (41%).  Marland Kitchen Spinal stenosis of lumbar region 06/19/2015  . Status post gastric banding   . 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 Tinnitus    comes and goes   . Unilateral primary osteoarthritis, right knee 12/13/2015  . Vertigo    6 months ago approx     ROS:   All systems reviewed and negative except as noted in the HPI.   Past Surgical History:  Procedure Laterality Date  . ABDOMINAL HYSTERECTOMY  1979   left ovary remains  . ABDOMINAL HYSTERECTOMY    . CHOLECYSTECTOMY    . fibrous tissue removed from right shoulder and back of neck      2 years ago   . GANGLION CYST EXCISION    . GASTRIC BYPASS  1980   states had bypass with banding and band left in -  . KNEE SURGERY Bilateral    both knees  2001  . PVC ABLATION N/A 06/04/2017   Procedure: PVC ABLATION;  Surgeon: Evans Lance, MD;  Location: [facility];  Service: Cardiovascular;  Laterality: N/A;    . RIGHT/LEFT HEART CATH AND CORONARY ANGIOGRAPHY Bilateral 08/12/2016   Procedure: Right/Left Heart Cath and Coronary Angiography;  Surgeon: Yolonda Kida, MD;  Location: [facility];  Service: Cardiovascular;  Laterality: Bilateral;  . TONSILLECTOMY     age 35  . TOTAL KNEE ARTHROPLASTY Left  08/02/2015   Procedure: LEFT TOTAL KNEE ARTHROPLASTY;  Surgeon: Mcarthur Rossetti, MD;  Location: [facility];  Service: Orthopedics;  Laterality: Left;  . TOTAL KNEE ARTHROPLASTY Right 12/13/2015   Procedure: RIGHT TOTAL KNEE ARTHROPLASTY;  Surgeon: Mcarthur Rossetti, MD;  Location: [facility];  Service: Orthopedics;  Laterality: Right;     Family History  Problem Relation Age of Onset  . Hypertension Father   . Deep vein thrombosis Father   . Dementia Mother   . Diabetes Sister   . Congestive Heart Failure Sister   . Congestive Heart Failure Brother   . Congestive Heart Failure Daughter   . Congestive Heart Failure Son   . Breast cancer Maternal Aunt        great aunt     Social History   Socioeconomic History  . Marital status: Single    Spouse name: Not on file  . Number of children: Not on file  . Years of education: Not on file  . Highest education level: Not on file  Occupational History  . Not on file  Social Needs  . Financial resource strain: Not on file  . Food insecurity:    Worry: Not on file    Inability: Not on file  . Transportation needs:    Medical: Not on file    Non-medical: Not on file  Tobacco Use  . Smoking status: Current Some Day Smoker    Packs/day: 0.10    Years: 50.00    Pack years: 5.00    Last attempt to quit: 08/22/2016    Years since quitting: 0.8  . Smokeless tobacco: Former Systems developer    Types: Snuff, Sarina Ser    Quit date: 01/13/1995  Substance and Sexual Activity  . Alcohol use: No    Alcohol/week: 0.0 oz  . Drug use: No  . Sexual activity: Not on file  Lifestyle  . Physical activity:    Days per week: Not on file    Minutes per  session: Not on file  . Stress: Not on file  Relationships  . Social connections:    Talks on phone: Not on file    Gets together: Not on file    Attends religious service: Not on file    Active member of club or organization: Not on file    Attends meetings of clubs or organizations: Not on file    Relationship status: Not on file  . Intimate partner violence:    Fear of current or ex partner: Not on file    Emotionally abused: Not on file    Physically abused: Not on file    Forced sexual activity: Not on file  Other Topics Concern  . Not on file  Social History Narrative  . Not on file     BP 118/74   Pulse 78   Ht 5\' 7"  (1.702 m)   Wt (!) 302 lb (137 kg)   SpO2 96%   BMI 47.30 kg/m   Physical Exam:  Well appearing obese 67 yo woman, no acute distress Neck:  7 cm JVD, no thyromegally Lymphatics:  No adenopathy Back:  No CVA tenderness Lungs:  Clear with no wheezes HEART:  Regular rate rhythm, no murmurs, no rubs, no clicks Abd:  soft, positive bowel sounds, no organomegally, no rebound, no guarding Ext:  2 plus pulses, no edema, no cyanosis, no clubbing Skin:  No rashes no nodules Neuro:  CN II through XII intact, motor grossly intact  EKG - NSR  with a rare PVC    Assess/Plan: 1. PVC's - she is much improved. We will hold off on allowing her to obtain another 24 hour holter unless her symptoms worsen.  2. Chronic systolic heart failure - her symptoms remain class 2. No change in her meds for now. 3. Obesity - I have strongly encouraged the patient to lose weight.   Mikle [email or web address].D

## 2017-08-04 ENCOUNTER — Other Ambulatory Visit: Payer: Self-pay | Admitting: Internal Medicine

## 2017-08-05 ENCOUNTER — Telehealth: Payer: Self-pay | Admitting: Internal Medicine

## 2017-08-05 DIAGNOSIS — I493 Ventricular premature depolarization: Secondary | ICD-10-CM

## 2017-08-05 NOTE — Telephone Encounter (Signed)
Placed on heathers desk Marked Urgent

## 2017-08-05 NOTE — Telephone Encounter (Signed)
I called and spoke with the patient to follow up on the fax received from [facility] stating that the patient's weight had trended up 6 lbs overnight.  Per the patient, her weight yesterday was 296 lbs and today she was 301 lbs first thing this morning. She states [employer] had to have someone help troubleshoot her scale as it was only reading in kilograms.  She weighed later today with her clothes on and was only 299 lbs.  She has been having from lower extremity edema and increased SOB. She has developed a productive cough. She feels her issues started last week after she ate some sea food to which she think she may have had an allergic reaction to. She took benadryl 1 day last week and felt some relief from her coughing.  The patient states that some days she urinates quite a bit and other days she does not urinate that much.  She states she does not eat any pre-packaged foods or add salt to her diet. Her fluid intake is the same as it has been. She typically sleeps elevated, but may lower her incline during the night at times. She states she cannot do that over the last week as it aggravates her cough.   She is currently taking lasix (furemide) 40 mg once daily.  She states she was told by Dr. Caryl Comes she could take an extra lasix as needed for increased SOB/ swelling, however, the nurse from [facility] advised her not to do that since she is s/p PVC ablation on 06/04/17.  I advised her I have not heard of that, but will review with Dr. Caryl Comes to see how he would like her to proceed with her taking her furosemide and call her back. She voices understanding and is agreeable.

## 2017-08-05 NOTE — Telephone Encounter (Signed)
Called and spoke with Pt.  Her DOE + edema. Rec 80 bid x 2 days

## 2017-08-05 NOTE — Telephone Encounter (Signed)
Received fax from [facility] alert report .  Patient instructed to call md immediately for weight gain and Heart Failure symptoms .  Placed in Nurse box.

## 2017-08-06 ENCOUNTER — Telehealth: Payer: Self-pay | Admitting: Internal Medicine

## 2017-08-06 NOTE — Telephone Encounter (Signed)
Cancel.  Per heather these were already sent.

## 2017-08-06 NOTE — Telephone Encounter (Signed)
Recieved request from : [facility] Case manager   Forwarded to [employer] for processing

## 2017-08-06 NOTE — Telephone Encounter (Signed)
Received another alert fax from [facility] case manager.  Placed on heathers desk.

## 2017-08-11 ENCOUNTER — Telehealth: Payer: Self-pay | Admitting: Cardiovascular Disease

## 2017-08-11 NOTE — Telephone Encounter (Signed)
Patient dropped off report with Arrythmia from Rehab .  Patient still having pvc's per note.    Placed in nurse box.  Patient needs an hour to get home for nurse to call.

## 2017-08-12 NOTE — Telephone Encounter (Signed)
Late entry- Per Dr. Caryl Comes, the patient needs to have an echo repeated at the end of August and follow up with him after that.  He states he informed the patient of this when he spoke with her on 08/05/17.   Order placed for an echo.  Will send to scheduling to please contact the patient and arrange appointments.

## 2017-08-13 NOTE — Telephone Encounter (Signed)
Left voicemail message for her to call back.  

## 2017-08-13 NOTE — Telephone Encounter (Signed)
Documentation received from Gerlene Burdock at [facility]. It states that patient had PVC's and she was Asymptomatic. It also included rhythm strips dated from 08/11/17 addressed to Dr. Caryl Comes. Dr. Olin Pia nurse reviewed and will check with patient to see how she is feeling. Left voicemail message for her to call back.

## 2017-08-19 NOTE — Telephone Encounter (Signed)
I have strips from [facility] to review with Dr. Caryl Comes when he is back in the office next week.  Dr. Caryl Comes had already scheduled her for a follow up echo and appt to see him.  Echo- 8/28 SK f/u - 8/29

## 2017-09-01 ENCOUNTER — Telehealth: Payer: Self-pay | Admitting: Internal Medicine

## 2017-09-01 DIAGNOSIS — I493 Ventricular premature depolarization: Secondary | ICD-10-CM

## 2017-09-01 NOTE — Telephone Encounter (Signed)
Received fax from [facility] placed in nurse box   Pt c/o swelling: STAT is pt has developed SOB within 24 hours  1) How much weight have you gained and in what time span? 4.6 lbs  ? 1 day see chart   2) If swelling, where is the swelling located? Pedal edema

## 2017-09-02 NOTE — Telephone Encounter (Signed)
I spoke with the patient regarding her reported weight gain from [facility].  She states that her weight was up yesterday to 296.8 lbs. She has been taking lasix 40 mg once daily. Her medication list states is taking 40 mg  BID. She states Dr. Caryl Comes had told her previously if her weight goes up, she could take lasix 80 mg BID. She did take lasix 80 mg BID yesterday and her weight is down to 294.8 lbs today.  I inquired as to what her baseline weight has been as the last recorded in Epic 07/02/17 was 302 lbs.  Per the patient " I was down to 291 lbs a few weeks ago." She has been having some swelling to her lower extremities over the last few days. She states this will typically go down over night, but she is still having some puffiness in her feet in the mornings.  She did take lasix 80 mg this morning. I have asked that she call me tomorrow if her weight is not continuing to trend down.  She states she knows that if things are not improving for her after 3 days, she is to call the office for further instructions.  I have also discussed with the patient that Dr. Caryl Comes received some EKG tracings on her from [facility] from the end of July and she was having quite a few PVC's at the time. The patient states she is not typically monitored at cardiac rehab, but was hooked up that day as she was SOB.  She is aware that per Dr. Caryl Comes, he has recommended that she wear a 24 hour holter to quantify PVC burden. The patient is agreeable to this. Order placed. Will try to obtain prior to her follow up appointment with Dr. Caryl Comes next week.

## 2017-09-06 ENCOUNTER — Ambulatory Visit (INDEPENDENT_AMBULATORY_CARE_PROVIDER_SITE_OTHER): Payer: Medicare Other

## 2017-09-06 DIAGNOSIS — I493 Ventricular premature depolarization: Secondary | ICD-10-CM

## 2017-09-08 ENCOUNTER — Ambulatory Visit (INDEPENDENT_AMBULATORY_CARE_PROVIDER_SITE_OTHER): Payer: Medicare Other

## 2017-09-08 ENCOUNTER — Ambulatory Visit: Payer: Medicare Other | Attending: Anesthesiology | Admitting: Anesthesiology

## 2017-09-08 ENCOUNTER — Encounter: Payer: Self-pay | Admitting: Anesthesiology

## 2017-09-08 ENCOUNTER — Other Ambulatory Visit: Payer: Self-pay

## 2017-09-08 VITALS — BP 123/85 | HR 84 | Temp 98.2°F | Resp 16 | Ht 67.0 in | Wt 295.0 lb

## 2017-09-08 DIAGNOSIS — M5432 Sciatica, left side: Secondary | ICD-10-CM

## 2017-09-08 DIAGNOSIS — F1190 Opioid use, unspecified, uncomplicated: Secondary | ICD-10-CM | POA: Diagnosis not present

## 2017-09-08 DIAGNOSIS — Z7982 Long term (current) use of aspirin: Secondary | ICD-10-CM | POA: Diagnosis not present

## 2017-09-08 DIAGNOSIS — Z79891 Long term (current) use of opiate analgesic: Secondary | ICD-10-CM | POA: Insufficient documentation

## 2017-09-08 DIAGNOSIS — M25551 Pain in right hip: Secondary | ICD-10-CM

## 2017-09-08 DIAGNOSIS — M48062 Spinal stenosis, lumbar region with neurogenic claudication: Secondary | ICD-10-CM | POA: Diagnosis not present

## 2017-09-08 DIAGNOSIS — E669 Obesity, unspecified: Secondary | ICD-10-CM | POA: Insufficient documentation

## 2017-09-08 DIAGNOSIS — Z6841 Body Mass Index (BMI) 40.0 and over, adult: Secondary | ICD-10-CM | POA: Insufficient documentation

## 2017-09-08 DIAGNOSIS — M62838 Other muscle spasm: Secondary | ICD-10-CM | POA: Diagnosis not present

## 2017-09-08 DIAGNOSIS — M1712 Unilateral primary osteoarthritis, left knee: Secondary | ICD-10-CM

## 2017-09-08 DIAGNOSIS — G894 Chronic pain syndrome: Secondary | ICD-10-CM | POA: Diagnosis present

## 2017-09-08 DIAGNOSIS — M25531 Pain in right wrist: Secondary | ICD-10-CM | POA: Insufficient documentation

## 2017-09-08 DIAGNOSIS — M545 Low back pain: Secondary | ICD-10-CM | POA: Diagnosis not present

## 2017-09-08 DIAGNOSIS — M19011 Primary osteoarthritis, right shoulder: Secondary | ICD-10-CM

## 2017-09-08 DIAGNOSIS — M5136 Other intervertebral disc degeneration, lumbar region: Secondary | ICD-10-CM | POA: Insufficient documentation

## 2017-09-08 DIAGNOSIS — M25511 Pain in right shoulder: Secondary | ICD-10-CM | POA: Diagnosis present

## 2017-09-08 DIAGNOSIS — Z79899 Other long term (current) drug therapy: Secondary | ICD-10-CM | POA: Insufficient documentation

## 2017-09-08 DIAGNOSIS — G8929 Other chronic pain: Secondary | ICD-10-CM

## 2017-09-08 DIAGNOSIS — I493 Ventricular premature depolarization: Secondary | ICD-10-CM

## 2017-09-08 LAB — ECHOCARDIOGRAM COMPLETE
Height: 67 in
Weight: 4720 oz

## 2017-09-08 MED ORDER — GABAPENTIN 300 MG PO CAPS: 600.0000 mg | ORAL_CAPSULE | Freq: Three times a day (TID) | ORAL | 0 refills | Status: DC

## 2017-09-08 MED ORDER — IBUPROFEN 800 MG PO TABS: 800.0000 mg | ORAL_TABLET | Freq: Three times a day (TID) | ORAL | 1 refills | Status: DC | PRN

## 2017-09-08 MED ORDER — OXYCODONE-ACETAMINOPHEN 7.5-325 MG PO TABS: 1.0000 | ORAL_TABLET | Freq: Four times a day (QID) | ORAL | 0 refills | Status: DC | PRN

## 2017-09-08 MED ORDER — CYCLOBENZAPRINE HCL 10 MG PO TABS: 10.0000 mg | ORAL_TABLET | Freq: Three times a day (TID) | ORAL | 1 refills | Status: DC | PRN

## 2017-09-08 NOTE — Progress Notes (Signed)
Subjective:  Patient ID: Wendy Sanchez, female    DOB: [birth year]  Age: 67 y.o. MRN: [id number]  CC: Back Pain (lower lumbar bilateral); Shoulder Pain (right); and Wrist Pain (left )   Procedure: None  HPI Wendy Sanchez presents for reevaluation.  She was last seen 2 months ago and is been doing well with the regimen.  Based on her narcotic assessment sheet she she is continued to derive good functional lifestyle improvement with her medications.  She is using her medications as prescribed with no diverting or illicit use and getting good relief with these.  No side effects are noted.  The quality characteristic and distribution of her pain are also unchanged and otherwise she is in stable condition at this point.  Outpatient Medications Prior to Visit  Medication Sig Dispense Refill  . albuterol (PROVENTIL HFA;VENTOLIN HFA) 108 (90 Base) MCG/ACT inhaler Inhale 2 puffs into the lungs every 6 (six) hours as needed for wheezing or shortness of breath.    Marland Kitchen aspirin EC 81 MG tablet Take 1 tablet (81 mg total) daily by mouth. 90 tablet 3  . carvedilol (COREG) 6.25 MG tablet TAKE 1 TABLET BY MOUTH TWO  TIMES DAILY 180 tablet 3  . dexlansoprazole (DEXILANT) 60 MG capsule Take 60 mg by mouth daily.    . diclofenac sodium (VOLTAREN) 1 % GEL Apply 2 g topically 4 (four) times daily as needed (pain).     Marland Kitchen diphenhydrAMINE (BENADRYL) 25 mg capsule Take 25 mg by mouth every 8 (eight) hours as needed for allergies.     Marland Kitchen docusate sodium (COLACE) 100 MG capsule Take 200 mg by mouth at bedtime.     Marland Kitchen ENTRESTO 24-26 MG TAKE 1 TABLET BY MOUTH TWO  TIMES DAILY 563 tablet 3  . folic acid (FOLVITE) 1 MG tablet Take 1 mg by mouth at bedtime.     . furosemide (LASIX) 40 MG tablet Take 40 mg by mouth 2 (two) times daily. Take  tablets (40 mg) in the morning & 1 tablet (40 mg) in the afternoon    . levocetirizine (XYZAL) 5 MG tablet Take 5 mg by mouth at bedtime.     . mirabegron ER (MYRBETRIQ) 50 MG TB24  tablet Take 50 mg by mouth daily.    . montelukast (SINGULAIR) 10 MG tablet Take 10 mg by mouth at bedtime.    . Multiple Vitamins-Minerals (MULTIVITAMIN GUMMIES ADULTS) CHEW Chew 2 tablets by mouth daily.     . nortriptyline (PAMELOR) 50 MG capsule Take 50 mg by mouth at bedtime.    Marland Kitchen nystatin cream (MYCOSTATIN) Apply 1 application topically 2 (two) times daily as needed (irritation).     . Omega-3 Fatty Acids (FISH OIL) 1000 MG CAPS Take 1 capsule by mouth every 3 (three) days. Take 1000mg  every 3rd day     . potassium chloride SA (K-DUR,KLOR-CON) 20 MEQ tablet Take 20 mEq by mouth See admin instructions. Take 20 meq every third day    . Probiotic Product (PROBIOTIC COLON SUPPORT) CAPS Take 1 capsule by mouth at bedtime.    . sucralfate (CARAFATE) 1 G tablet Take 1 g by mouth 2 (two) times daily as needed (esophageal burning).     . Vitamin D, Ergocalciferol, (DRISDOL) 50000 UNITS CAPS capsule Take 50,000 Units by mouth every 7 (seven) days. thursdays    . cyclobenzaprine (FLEXERIL) 10 MG tablet Take 1 tablet (10 mg total) by mouth 3 (three) times daily as needed. for muscle spams 270  tablet 1  . gabapentin (NEURONTIN) 300 MG capsule Take 2 capsules (600 mg total) by mouth 3 (three) times daily. 540 capsule 0  . ibuprofen (ADVIL,MOTRIN) 800 MG tablet Take 1 tablet (800 mg total) by mouth every 8 (eight) hours as needed. 270 tablet 1  . oxyCODONE-acetaminophen (PERCOCET) 7.5-325 MG tablet Take 1 tablet by mouth every 6 (six) hours as needed for severe pain. 120 tablet 0  . nystatin (MYCOSTATIN) 100000 UNIT/ML suspension Take 5 mLs by mouth 4 (four) times daily as needed (thrush).     Marland Kitchen tiotropium (SPIRIVA) 18 MCG inhalation capsule Place 18 mcg into inhaler and inhale daily.      No facility-administered medications prior to visit.     Review of Systems CNS: No confusion or sedation Cardiac: No angina or palpitations GI: No abdominal pain or constipation Constitutional: No nausea vomiting  fevers or chills  Objective:  BP 123/85 (BP Location: Left Arm, Patient Position: Sitting, Cuff Size: Normal) Comment (Cuff Size): forearm  Pulse 84   Temp 98.2 F (36.8 C) (Oral)   Resp 16   Ht 5\' 7"  (1.702 m)   Wt 295 lb (133.8 kg)   SpO2 98%   BMI 46.20 kg/m    BP Readings from Last 3 Encounters:  09/08/17 123/85  07/02/17 118/74  06/30/17 110/90     Wt Readings from Last 3 Encounters:  09/08/17 295 lb (133.8 kg)  07/02/17 (!) 302 lb (137 kg)  06/30/17 (!) 301 lb (136.5 kg)     Physical Exam Pt is alert and oriented PERRL EOMi LCTA no wheezing or rales MUSCULOSKELETAL reveals some paraspinous muscle tenderness in the low back.  Her muscle tone and bulk is at baseline.  She is ambulating with the use of a cane.  Labs  No results found for: HGBA1C Lab Results  Component Value Date   LDLCALC (H) 03/16/2007    150        Total Cholesterol/HDL:CHD Risk Coronary Heart Disease Risk Table                     Men   Women  1/2 Average Risk   3.4   3.3   CREATININE 0.91 05/31/2017    -------------------------------------------------------------------------------------------------------------------- Lab Results  Component Value Date   WBC 3.7 05/31/2017   HGB 13.4 05/31/2017   HCT 39.7 05/31/2017   PLT 283 05/31/2017   GLUCOSE 97 05/31/2017   CHOL (H) 03/16/2007    236        ATP III CLASSIFICATION:  <200     mg/dL   Desirable  200-239  mg/dL   Borderline High  >=240    mg/dL   High   TRIG 76 03/16/2007   HDL 71 03/16/2007   LDLCALC (H) 03/16/2007    150        Total Cholesterol/HDL:CHD Risk Coronary Heart Disease Risk Table                     Men   Women  1/2 Average Risk   3.4   3.3   ALT 11 02/25/2017   AST 13 02/25/2017   NA 141 05/31/2017   K 3.6 05/31/2017   CL 103 05/31/2017   CREATININE 0.91 05/31/2017   BUN 10 05/31/2017   CO2 22 05/31/2017   TSH 1.820 02/25/2017   INR 1.11 07/26/2015     --------------------------------------------------------------------------------------------------------------------- No results found.   Assessment & Plan:   Dashonda was seen today for  back pain, shoulder pain and wrist pain.  Diagnoses and all orders for this visit:  Chronic pain syndrome  Chronic, continuous use of opioids -     Discontinue: oxyCODONE-acetaminophen (PERCOCET) 7.5-325 MG tablet; Take 1 tablet by mouth every 6 (six) hours as needed for severe pain. -     oxyCODONE-acetaminophen (PERCOCET) 7.5-325 MG tablet; Take 1 tablet by mouth every 6 (six) hours as needed for severe pain.  Primary osteoarthritis of right shoulder  Chronic pain of right hip  Spinal stenosis of lumbar region with neurogenic claudication  Primary osteoarthritis of left knee  DDD (degenerative disc disease), lumbar  Sciatica, left side  Other orders -     cyclobenzaprine (FLEXERIL) 10 MG tablet; Take 1 tablet (10 mg total) by mouth 3 (three) times daily as needed. for muscle spams -     gabapentin (NEURONTIN) 300 MG capsule; Take 2 capsules (600 mg total) by mouth 3 (three) times daily. -     ibuprofen (ADVIL,MOTRIN) 800 MG tablet; Take 1 tablet (800 mg total) by mouth every 8 (eight) hours as needed.        ----------------------------------------------------------------------------------------------------------------------  Problem List Items Addressed This Visit      Unprioritized   Chronic pain syndrome - Primary   Osteoarthritis of left knee   Relevant Medications   oxyCODONE-acetaminophen (PERCOCET) 7.5-325 MG tablet   cyclobenzaprine (FLEXERIL) 10 MG tablet   ibuprofen (ADVIL,MOTRIN) 800 MG tablet   Spinal stenosis of lumbar region    Other Visit Diagnoses    Chronic, continuous use of opioids       Relevant Medications   oxyCODONE-acetaminophen (PERCOCET) 7.5-325 MG tablet   Primary osteoarthritis of right shoulder       Relevant Medications    oxyCODONE-acetaminophen (PERCOCET) 7.5-325 MG tablet   cyclobenzaprine (FLEXERIL) 10 MG tablet   ibuprofen (ADVIL,MOTRIN) 800 MG tablet   Chronic pain of right hip       Relevant Medications   oxyCODONE-acetaminophen (PERCOCET) 7.5-325 MG tablet   cyclobenzaprine (FLEXERIL) 10 MG tablet   gabapentin (NEURONTIN) 300 MG capsule   ibuprofen (ADVIL,MOTRIN) 800 MG tablet   DDD (degenerative disc disease), lumbar       Relevant Medications   oxyCODONE-acetaminophen (PERCOCET) 7.5-325 MG tablet   cyclobenzaprine (FLEXERIL) 10 MG tablet   ibuprofen (ADVIL,MOTRIN) 800 MG tablet   Sciatica, left side       Relevant Medications   cyclobenzaprine (FLEXERIL) 10 MG tablet   gabapentin (NEURONTIN) 300 MG capsule        ----------------------------------------------------------------------------------------------------------------------  1. Chronic pain syndrome We will keep her on her current medication regimen with refills given today for August 30 and September 29.  This is for Percocet 7.5 mg tablets.  We have reviewed the [facility] practitioner database information and it is appropriate.  We will have her return to clinic in 2 months.  2. Chronic, continuous use of opioids  - oxyCODONE-acetaminophen (PERCOCET) 7.5-325 MG tablet; Take 1 tablet by mouth every 6 (six) hours as needed for severe pain.  Dispense: 120 tablet; Refill: 0  3. Primary osteoarthritis of right shoulder   4. Chronic pain of right hip   5. Spinal stenosis of lumbar region with neurogenic claudication Continue with back stretching strengthening exercises.  I have cautioned her against squats under her current condition with obesity and limited core strength.  This seems to be aggravating her bilateral calves and left knee.  6. Primary osteoarthritis of left knee As above  7. DDD (degenerative disc disease),  lumbar Above  8. Sciatica, left  side Above    ----------------------------------------------------------------------------------------------------------------------  I am having [name] T. Burgert maintain her dexlansoprazole, sucralfate, montelukast, diclofenac sodium, folic acid, mirabegron ER, potassium chloride SA, Vitamin D (Ergocalciferol), PROBIOTIC COLON SUPPORT, MULTIVITAMIN GUMMIES ADULTS, nortriptyline, levocetirizine, docusate sodium, albuterol, Fish Oil, tiotropium, aspirin EC, diphenhydrAMINE, furosemide, nystatin, nystatin cream, carvedilol, ENTRESTO, oxyCODONE-acetaminophen, cyclobenzaprine, gabapentin, and ibuprofen.   Meds ordered this encounter  Medications  . DISCONTD: oxyCODONE-acetaminophen (PERCOCET) 7.5-325 MG tablet    Sig: Take 1 tablet by mouth every 6 (six) hours as needed for severe pain.    Dispense:  120 tablet    Refill:  0    Do not fill until [id number]  . oxyCODONE-acetaminophen (PERCOCET) 7.5-325 MG tablet    Sig: Take 1 tablet by mouth every 6 (six) hours as needed for severe pain.    Dispense:  120 tablet    Refill:  0    Do not fill until [id number]  . cyclobenzaprine (FLEXERIL) 10 MG tablet    Sig: Take 1 tablet (10 mg total) by mouth 3 (three) times daily as needed. for muscle spams    Dispense:  270 tablet    Refill:  1    This is a 3 month supply. Each Rx must last 3 months before refill  . gabapentin (NEURONTIN) 300 MG capsule    Sig: Take 2 capsules (600 mg total) by mouth 3 (three) times daily.    Dispense:  540 capsule    Refill:  0    This is a 3 month supply  . ibuprofen (ADVIL,MOTRIN) 800 MG tablet    Sig: Take 1 tablet (800 mg total) by mouth every 8 (eight) hours as needed.    Dispense:  270 tablet    Refill:  1    This is a 3 month supply   Patient's Medications  New Prescriptions   No medications on file  Previous Medications   ALBUTEROL (PROVENTIL HFA;VENTOLIN HFA) 108 (90 BASE) MCG/ACT INHALER    Inhale 2 puffs into the lungs every 6 (six) hours as needed  for wheezing or shortness of breath.   ASPIRIN EC 81 MG TABLET    Take 1 tablet (81 mg total) daily by mouth.   CARVEDILOL (COREG) 6.25 MG TABLET    TAKE 1 TABLET BY MOUTH TWO  TIMES DAILY   DEXLANSOPRAZOLE (DEXILANT) 60 MG CAPSULE    Take 60 mg by mouth daily.   DICLOFENAC SODIUM (VOLTAREN) 1 % GEL    Apply 2 g topically 4 (four) times daily as needed (pain).    DIPHENHYDRAMINE (BENADRYL) 25 MG CAPSULE    Take 25 mg by mouth every 8 (eight) hours as needed for allergies.    DOCUSATE SODIUM (COLACE) 100 MG CAPSULE    Take 200 mg by mouth at bedtime.    ENTRESTO 24-26 MG    TAKE 1 TABLET BY MOUTH TWO  TIMES DAILY   FOLIC ACID (FOLVITE) 1 MG TABLET    Take 1 mg by mouth at bedtime.    FUROSEMIDE (LASIX) 40 MG TABLET    Take 40 mg by mouth 2 (two) times daily. Take  tablets (40 mg) in the morning & 1 tablet (40 mg) in the afternoon   LEVOCETIRIZINE (XYZAL) 5 MG TABLET    Take 5 mg by mouth at bedtime.    MIRABEGRON ER (MYRBETRIQ) 50 MG TB24 TABLET    Take 50 mg by mouth daily.   MONTELUKAST (SINGULAIR) 10 MG TABLET  Take 10 mg by mouth at bedtime.   MULTIPLE VITAMINS-MINERALS (MULTIVITAMIN GUMMIES ADULTS) CHEW    Chew 2 tablets by mouth daily.    NORTRIPTYLINE (PAMELOR) 50 MG CAPSULE    Take 50 mg by mouth at bedtime.   NYSTATIN (MYCOSTATIN) 100000 UNIT/ML SUSPENSION    Take 5 mLs by mouth 4 (four) times daily as needed (thrush).    NYSTATIN CREAM (MYCOSTATIN)    Apply 1 application topically 2 (two) times daily as needed (irritation).    OMEGA-3 FATTY ACIDS (FISH OIL) 1000 MG CAPS    Take 1 capsule by mouth every 3 (three) days. Take 1000mg  every 3rd day    POTASSIUM CHLORIDE SA (K-DUR,KLOR-CON) 20 MEQ TABLET    Take 20 mEq by mouth See admin instructions. Take 20 meq every third day   PROBIOTIC PRODUCT (PROBIOTIC COLON SUPPORT) CAPS    Take 1 capsule by mouth at bedtime.   SUCRALFATE (CARAFATE) 1 G TABLET    Take 1 g by mouth 2 (two) times daily as needed (esophageal burning).    TIOTROPIUM  (SPIRIVA) 18 MCG INHALATION CAPSULE    Place 18 mcg into inhaler and inhale daily.    VITAMIN D, ERGOCALCIFEROL, (DRISDOL) 50000 UNITS CAPS CAPSULE    Take 50,000 Units by mouth every 7 (seven) days. thursdays  Modified Medications   Modified Medication Previous Medication   CYCLOBENZAPRINE (FLEXERIL) 10 MG TABLET cyclobenzaprine (FLEXERIL) 10 MG tablet      Take 1 tablet (10 mg total) by mouth 3 (three) times daily as needed. for muscle spams    Take 1 tablet (10 mg total) by mouth 3 (three) times daily as needed. for muscle spams   GABAPENTIN (NEURONTIN) 300 MG CAPSULE gabapentin (NEURONTIN) 300 MG capsule      Take 2 capsules (600 mg total) by mouth 3 (three) times daily.    Take 2 capsules (600 mg total) by mouth 3 (three) times daily.   IBUPROFEN (ADVIL,MOTRIN) 800 MG TABLET ibuprofen (ADVIL,MOTRIN) 800 MG tablet      Take 1 tablet (800 mg total) by mouth every 8 (eight) hours as needed.    Take 1 tablet (800 mg total) by mouth every 8 (eight) hours as needed.   OXYCODONE-ACETAMINOPHEN (PERCOCET) 7.5-325 MG TABLET oxyCODONE-acetaminophen (PERCOCET) 7.5-325 MG tablet      Take 1 tablet by mouth every 6 (six) hours as needed for severe pain.    Take 1 tablet by mouth every 6 (six) hours as needed for severe pain.  Discontinued Medications   No medications on file   ----------------------------------------------------------------------------------------------------------------------  Follow-up: Return in about 2 months (around 11/08/2017).    Molli Barrows, MD

## 2017-09-08 NOTE — Progress Notes (Signed)
Nursing Pain Medication Assessment:  Safety precautions to be maintained throughout the outpatient stay will include: orient to surroundings, keep bed in low position, maintain call bell within reach at all times, provide assistance with transfer out of bed and ambulation.  Medication Inspection Compliance: Pill count conducted under aseptic conditions, in front of the patient. Neither the pills nor the bottle was removed from the patient's sight at any time. Once count was completed pills were immediately returned to the patient in their original bottle.  Medication: Oxycodone/APAP Pill/Patch Count: 11 of 120 pills remain Pill/Patch Appearance: Markings consistent with prescribed medication Bottle Appearance: Standard pharmacy container. Clearly labeled. Filled Date: 07 / 31 / 2019 Last Medication intake:  Today

## 2017-09-09 ENCOUNTER — Ambulatory Visit
Admission: RE | Admit: 2017-09-09 | Discharge: 2017-09-09 | Disposition: A | Discharge status: Home / Self Care | Payer: Medicare Other | Source: Ambulatory Visit | Attending: Internal Medicine | Admitting: Internal Medicine

## 2017-09-09 ENCOUNTER — Encounter: Payer: Self-pay | Admitting: Internal Medicine

## 2017-09-09 ENCOUNTER — Ambulatory Visit (INDEPENDENT_AMBULATORY_CARE_PROVIDER_SITE_OTHER): Payer: Medicare Other | Admitting: Internal Medicine

## 2017-09-09 ENCOUNTER — Ambulatory Visit (INDEPENDENT_AMBULATORY_CARE_PROVIDER_SITE_OTHER): Payer: Medicare Other

## 2017-09-09 VITALS — BP 110/78 | HR 73 | Ht 67.0 in | Wt 300.8 lb

## 2017-09-09 DIAGNOSIS — M79605 Pain in left leg: Secondary | ICD-10-CM | POA: Diagnosis not present

## 2017-09-09 DIAGNOSIS — I493 Ventricular premature depolarization: Secondary | ICD-10-CM

## 2017-09-09 MED ORDER — CARVEDILOL 6.25 MG PO TABS: 6.2500 mg | ORAL_TABLET | Freq: Two times a day (BID) | ORAL | 3 refills | Status: DC

## 2017-09-09 MED ORDER — FUROSEMIDE 40 MG PO TABS: ORAL_TABLET | ORAL | 3 refills | Status: DC

## 2017-09-09 MED ORDER — FUROSEMIDE 40 MG PO TABS: 40.0000 mg | ORAL_TABLET | Freq: Two times a day (BID) | ORAL | 3 refills | Status: DC

## 2017-09-09 MED ORDER — SACUBITRIL-VALSARTAN 24-26 MG PO TABS: 1.0000 | ORAL_TABLET | Freq: Two times a day (BID) | ORAL | 3 refills | Status: DC

## 2017-09-09 NOTE — Progress Notes (Signed)
Patient Care Team: [name], MD as PCP - General (Family Medicine) Deboraha Sprang, MD as PCP - Cardiology (Cardiology)   HPI  Wendy Sanchez is a 67 y.o. 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  Initially seen in consultation 11/18  amiodarone was initiated   and subsequently ranolazine and then [facility] were added.  She is tolerating these medications well.  Blood pressures have been borderline but not with significant symptoms.  She underwent catheter ablation 4/19.  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  Ablation thus was limited but there is a marked reduction in PVC burden both in the lab and in follow-up with Dr. Elliot Cousin 6/19.   Less dyspnea, recently has reaccumulated peripheral edema.  She is also finding that she is having tenacious sputum difficult to expectorate.  Has a history of asthma  DATE TEST EF   6/18 Echo  30 % LVE/LAE  8/18 Cath   25 % Normal CAs  8/19 Echo  20-25%    Date Cr K TSH LFTs Mg Hgb   9/18 0.8 4.5     12.8  2/19 1.10 5.0 1.82 11     DATE Holter PVCs meds  11/18 41% +amio + ranolazine  2/19 17% +entresto  3/19 24% amio d/c      Past Medical History:  Diagnosis Date  . Allergic rhinitis   . Arthritis   . Asthma    problems with fumes and aerosols cause asthma  . Chest pain 10/19/2016  . Complication of anesthesia    awakens during surgery; has occurred with last 3-4 surgeries   . Environmental allergies    fumes   . Fibromyalgia   . GERD (gastroesophageal reflux disease)   . History of bronchitis   . Hx of total knee arthroplasty 12/13/2015  . Hypertension   . Morbid obesity with BMI of 45.0-49.9, adult ([place]) 07/25/2015  . NICM (nonischemic cardiomyopathy) ([place])    a. ? PVC mediated;  b. 06/2016 Echo: EF 25-30%, mild LVH, mild LAE, mild AI/MR/TR/PR; c. 08/2016 Cath: nl cors, EF 25%.  . Numbness    hands bilat when driving; improves when not preforming task   . Osteoarthritis of left  knee 08/02/2015  . Pes anserinus bursitis of left knee 05/18/2016  . Presence of right artificial knee joint 05/18/2016  . PVC (premature ventricular contraction) 06/04/2017  . PVC's (premature ventricular contractions)    a. 11/2016 Amio started;  b. 11/29/16 48h Holter: 05397 PVC's (41%); c. 12/2016 24h Holter: 18040 PVC's (15%); c. 02/2017 48h Holter: 37262 PVC's (41%).  Marland Kitchen Spinal stenosis of lumbar region 06/19/2015  . Status post gastric banding   . 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 Tinnitus    comes and goes   . Unilateral primary osteoarthritis, right knee 12/13/2015  . Vertigo    6 months ago approx     Past Surgical History:  Procedure Laterality Date  . ABDOMINAL HYSTERECTOMY  1979   left ovary remains  . ABDOMINAL HYSTERECTOMY    . CHOLECYSTECTOMY    . fibrous tissue removed from right shoulder and back of neck      2 years ago   . GANGLION CYST EXCISION    . GASTRIC BYPASS  1980   states had bypass with banding and band left in -  . KNEE SURGERY Bilateral    both knees  2001  . PVC ABLATION N/A 06/04/2017  Procedure: PVC ABLATION;  Surgeon: Evans Lance, MD;  Location: [facility];  Service: Cardiovascular;  Laterality: N/A;  . RIGHT/LEFT HEART CATH AND CORONARY ANGIOGRAPHY Bilateral 08/12/2016   Procedure: Right/Left Heart Cath and Coronary Angiography;  Surgeon: Yolonda Kida, MD;  Location: [facility];  Service: Cardiovascular;  Laterality: Bilateral;  . TONSILLECTOMY     age 79  . TOTAL KNEE ARTHROPLASTY Left 08/02/2015   Procedure: LEFT TOTAL KNEE ARTHROPLASTY;  Surgeon: Mcarthur Rossetti, MD;  Location: [facility];  Service: Orthopedics;  Laterality: Left;  . TOTAL KNEE ARTHROPLASTY Right 12/13/2015   Procedure: RIGHT TOTAL KNEE ARTHROPLASTY;  Surgeon: Mcarthur Rossetti, MD;  Location: [facility];  Service: Orthopedics;  Laterality: Right;    Current Outpatient Medications  Medication Sig Dispense Refill  . albuterol  (PROVENTIL HFA;VENTOLIN HFA) 108 (90 Base) MCG/ACT inhaler Inhale 2 puffs into the lungs every 6 (six) hours as needed for wheezing or shortness of breath.    Marland Kitchen aspirin EC 81 MG tablet Take 1 tablet (81 mg total) daily by mouth. 90 tablet 3  . cyclobenzaprine (FLEXERIL) 10 MG tablet Take 1 tablet (10 mg total) by mouth 3 (three) times daily as needed. for muscle spams 270 tablet 1  . dexlansoprazole (DEXILANT) 60 MG capsule Take 60 mg by mouth daily.    . diclofenac sodium (VOLTAREN) 1 % GEL Apply 2 g topically 4 (four) times daily as needed (pain).     Marland Kitchen diphenhydrAMINE (BENADRYL) 25 mg capsule Take 25 mg by mouth every 8 (eight) hours as needed for allergies.     Marland Kitchen docusate sodium (COLACE) 100 MG capsule Take 200 mg by mouth at bedtime.     . folic acid (FOLVITE) 1 MG tablet Take 1 mg by mouth at bedtime.     . gabapentin (NEURONTIN) 300 MG capsule Take 2 capsules (600 mg total) by mouth 3 (three) times daily. 540 capsule 0  . ibuprofen (ADVIL,MOTRIN) 800 MG tablet Take 1 tablet (800 mg total) by mouth every 8 (eight) hours as needed. 270 tablet 1  . levocetirizine (XYZAL) 5 MG tablet Take 5 mg by mouth at bedtime.     . mirabegron ER (MYRBETRIQ) 50 MG TB24 tablet Take 50 mg by mouth daily.    . montelukast (SINGULAIR) 10 MG tablet Take 10 mg by mouth at bedtime.    . Multiple Vitamins-Minerals (MULTIVITAMIN GUMMIES ADULTS) CHEW Chew 2 tablets by mouth daily.     . nortriptyline (PAMELOR) 50 MG capsule Take 50 mg by mouth at bedtime.    Marland Kitchen nystatin (MYCOSTATIN) 100000 UNIT/ML suspension Take 5 mLs by mouth 4 (four) times daily as needed (thrush).     . nystatin cream (MYCOSTATIN) Apply 1 application topically 2 (two) times daily as needed (irritation).     . Omega-3 Fatty Acids (FISH OIL) 1000 MG CAPS Take 1 capsule by mouth every 3 (three) days. Take 1000mg  every 3rd day     . oxyCODONE-acetaminophen (PERCOCET) 7.5-325 MG tablet Take 1 tablet by mouth every 6 (six) hours as needed for severe  pain. 120 tablet 0  . potassium chloride SA (K-DUR,KLOR-CON) 20 MEQ tablet Take 20 mEq by mouth See admin instructions. Take 20 meq every third day    . Probiotic Product (PROBIOTIC COLON SUPPORT) CAPS Take 1 capsule by mouth at bedtime.    . sucralfate (CARAFATE) 1 G tablet Take 1 g by mouth 2 (two) times daily as needed (esophageal burning).     Marland Kitchen  tiotropium (SPIRIVA) 18 MCG inhalation capsule Place 18 mcg into inhaler and inhale daily.     . Vitamin D, Ergocalciferol, (DRISDOL) 50000 UNITS CAPS capsule Take 50,000 Units by mouth every 7 (seven) days. thursdays    . carvedilol (COREG) 6.25 MG tablet Take 1 tablet (6.25 mg total) by mouth 2 (two) times daily. 180 tablet 3  . furosemide (LASIX) 40 MG tablet Take 1 tablet (40 mg total) by mouth 2 (two) times daily. Take  tablets (40 mg) in the morning & 1 tablet (40 mg) in the afternoon 180 tablet 3  . sacubitril-valsartan (ENTRESTO) 24-26 MG Take 1 tablet by mouth 2 (two) times daily. 180 tablet 3   No current facility-administered medications for this visit.     Allergies  Allergen Reactions  . Hydrocodone-Acetaminophen Swelling  . Iodine Anaphylaxis and Swelling  . Other Other (See Comments)    ALLERGY TO METAL - BLACKENS SKIN AND CAUSES A RASH   . Tape Swelling and Other (See Comments)    Skin comes off.  Paper tape is ok  . Shellfish Allergy Nausea And Vomiting and Other (See Comments)    Episode of GI infection after eating clam chowder. Still eats shrimp and other seafood      Review of Systems negative except from HPI and PMH  Physical Exam BP 110/78 (BP Location: Left Arm, Patient Position: Sitting, Cuff Size: Large)   Pulse 73   Ht 5\' 7"  (1.702 m)   Wt (!) 300 lb 12 oz (136.4 kg)   BMI 47.10 kg/m  Well developed and Morbidly obese but no a makes cute distress HENT normal Neck supple with JVP unable to discern Wheezes B Regular rate and rhythm, no murmurs or gallops Abd-soft with active BS No Clubbing cyanosis 1-2+  edema Skin-warm and dry A & Oriented  Grossly normal sensory and motor function   ECG sinus @ 73 19/09/39  Assessment and Plan:  Nonischemic cardiomyopathy persisting post ablation  PVCs-- s/p ablation largely extinguished     Left leg pain  Asthma-acute on chronic  Morbidly obese  Chronic systolic heart failure  She is volume overloaded.  We will increase her Lasix from 40--80 daily.  We will check a metabolic profile in 3 weeks.  With her leg pain and her obesity, we will undertake venous Doppler  I suggested that she use her albuterol inhaler and follow-up with Dr. Raul Del for her asthma  We will continue her on carvedilol and Entresto.  Addition of Aldactone at her next visit if blood pressure will tolerate it is in order  We spent more than 50% of our >25 min visit in face to face counseling regarding the above      Current medicines are reviewed at length with the patient today .  The patient does not have concerns regarding medicines.

## 2017-09-09 NOTE — Patient Instructions (Signed)
Medication Instructions: - Your physician has recommended you make the following change in your medication:   1) INCREASE lasix (furosemide) 40 mg- take 2 tablets (80 mg) by mouth once daily as directed  Labwork: - Your physician recommends that you return for lab work in: 2 weeks - BMP  Procedures/Testing: - Your physician has requested that you have a lower  extremity venous duplex. This test is an ultrasound of the veins in the legs. It looks at venous blood flow that carries blood from the heart to the legs. Allow one hour for a Lower Venous exam. There are no restrictions or special instructions.  Follow-Up: - Your physician recommends that you schedule a follow-up appointment in: 4 months with Dr. Caryl Comes.   Any Additional Special Instructions Will Be Listed Below (If Applicable).     If you need a refill on your cardiac medications before your next appointment, please call your pharmacy.

## 2017-09-20 ENCOUNTER — Other Ambulatory Visit (INDEPENDENT_AMBULATORY_CARE_PROVIDER_SITE_OTHER): Payer: Medicare Other

## 2017-09-20 DIAGNOSIS — I493 Ventricular premature depolarization: Secondary | ICD-10-CM | POA: Diagnosis not present

## 2017-09-21 LAB — BASIC METABOLIC PANEL
BUN / CREAT RATIO: 21 (ref 12–28)
BUN: 17 mg/dL (ref 8–27)
CO2: 25 mmol/L (ref 20–29)
Calcium: 9.4 mg/dL (ref 8.7–10.3)
Chloride: 106 mmol/L (ref 96–106)
Creatinine, Ser: 0.8 mg/dL (ref 0.57–1.00)
GFR calc Af Amer: 89 mL/min/{1.73_m2} (ref >59)
GFR calc non Af Amer: 77 mL/min/{1.73_m2} (ref >59)
GLUCOSE: 90 mg/dL (ref 65–99)
POTASSIUM: 4.6 mmol/L (ref 3.5–5.2)
Sodium: 146 mmol/L — ABNORMAL HIGH (ref 134–144)

## 2017-10-18 ENCOUNTER — Telehealth: Payer: Self-pay | Admitting: Internal Medicine

## 2017-10-18 DIAGNOSIS — I493 Ventricular premature depolarization: Secondary | ICD-10-CM

## 2017-10-18 NOTE — Telephone Encounter (Signed)
Form obtained. Rhythm strip showing NSR with PVC's. Patient has a known history of PVC's. Will review with Dr. Caryl Comes when he is back in the office.

## 2017-10-18 NOTE — Telephone Encounter (Signed)
Patient came by office Patient dropped off [facility] results for Dr. Caryl Comes to review Placed in nurse box

## 2017-10-19 NOTE — Telephone Encounter (Signed)
Known PVC  Lets get 24 hr holger plz

## 2017-10-20 NOTE — Telephone Encounter (Signed)
Dr. Caryl Comes,  Just wanted to double check-   PVC ablation- 06/04/17 24 hour holter worn- 09/06/17 Echo- 09/08/17 OV w/ you- 09/09/17  Are you still wanting another 24 hour holter?

## 2017-10-24 NOTE — Telephone Encounter (Signed)
Discussed yes to quantitate thePVCs

## 2017-10-25 NOTE — Telephone Encounter (Signed)
I called and spoke with the patient.  She is aware of Dr. Olin Pia recommendations for another 24 hour holter to assess PVC burden based on the fact that she appeared to be having an increased amount on the tracings she brought from [facility]hab.  I inquired if they had hooked her up to the monitor any more at [facility] Rehab and she states that had not.  She is asymptomatic at this time.  The patient is agreeable with a 24 hour holter. She is aware I will place this order and have scheduling call her to arrange.

## 2017-10-27 ENCOUNTER — Other Ambulatory Visit: Payer: Self-pay

## 2017-10-27 ENCOUNTER — Encounter: Payer: Self-pay | Admitting: Anesthesiology

## 2017-10-27 ENCOUNTER — Ambulatory Visit: Payer: Medicare Other | Attending: Anesthesiology | Admitting: Anesthesiology

## 2017-10-27 ENCOUNTER — Other Ambulatory Visit: Payer: Self-pay | Admitting: Anesthesiology

## 2017-10-27 VITALS — BP 114/92 | HR 70 | Temp 98.7°F | Resp 16 | Ht 67.0 in | Wt 293.0 lb

## 2017-10-27 DIAGNOSIS — M549 Dorsalgia, unspecified: Secondary | ICD-10-CM | POA: Insufficient documentation

## 2017-10-27 DIAGNOSIS — M545 Low back pain: Secondary | ICD-10-CM | POA: Diagnosis not present

## 2017-10-27 DIAGNOSIS — M25551 Pain in right hip: Secondary | ICD-10-CM

## 2017-10-27 DIAGNOSIS — F1190 Opioid use, unspecified, uncomplicated: Secondary | ICD-10-CM

## 2017-10-27 DIAGNOSIS — M48062 Spinal stenosis, lumbar region with neurogenic claudication: Secondary | ICD-10-CM | POA: Diagnosis not present

## 2017-10-27 DIAGNOSIS — Z79899 Other long term (current) drug therapy: Secondary | ICD-10-CM | POA: Diagnosis not present

## 2017-10-27 DIAGNOSIS — M5136 Other intervertebral disc degeneration, lumbar region: Secondary | ICD-10-CM | POA: Diagnosis not present

## 2017-10-27 DIAGNOSIS — M19011 Primary osteoarthritis, right shoulder: Secondary | ICD-10-CM | POA: Diagnosis not present

## 2017-10-27 DIAGNOSIS — Z7982 Long term (current) use of aspirin: Secondary | ICD-10-CM | POA: Insufficient documentation

## 2017-10-27 DIAGNOSIS — G894 Chronic pain syndrome: Secondary | ICD-10-CM | POA: Diagnosis not present

## 2017-10-27 DIAGNOSIS — M1712 Unilateral primary osteoarthritis, left knee: Secondary | ICD-10-CM | POA: Diagnosis not present

## 2017-10-27 DIAGNOSIS — Z79891 Long term (current) use of opiate analgesic: Secondary | ICD-10-CM | POA: Diagnosis not present

## 2017-10-27 DIAGNOSIS — G8929 Other chronic pain: Secondary | ICD-10-CM

## 2017-10-27 MED ORDER — OXYCODONE-ACETAMINOPHEN 7.5-325 MG PO TABS: 1.0000 | ORAL_TABLET | Freq: Four times a day (QID) | ORAL | 0 refills | Status: DC | PRN

## 2017-10-27 NOTE — Progress Notes (Signed)
Nursing Pain Medication Assessment:  Safety precautions to be maintained throughout the outpatient stay will include: orient to surroundings, keep bed in low position, maintain call bell within reach at all times, provide assistance with transfer out of bed and ambulation.  Medication Inspection Compliance: Pill count conducted under aseptic conditions, in front of the patient. Neither the pills nor the bottle was removed from the patient's sight at any time. Once count was completed pills were immediately returned to the patient in their original bottle.  Medication: Oxycodone IR Pill/Patch Count: 54 of 120 pills remain Pill/Patch Appearance: Markings consistent with prescribed medication Bottle Appearance: Standard pharmacy container. Clearly labeled. Filled Date: 10 / 01 / 2019 Last Medication intake:  Today

## 2017-10-27 NOTE — Patient Instructions (Signed)
Two printed scripts for oxycodone handed to patient to last until 01/09/2018 handed to patient.

## 2017-10-27 NOTE — Progress Notes (Signed)
Subjective:  Patient ID: Wendy Sanchez, female    DOB: [birth year]  Age: 67 y.o. MRN: [id number]  CC: Back Pain (upper and lower)   Procedure: None  HPI RETINA BERNARDY presents for reevaluation.  She was last seen 2 months ago and is been doing well with her oxycodone.  She is taking this as Percocet and doing well with this regimen.  No changes in the quality characteristic or distribution of her pain are noted.  This is been stable in nature and her strength is being at baseline throughout the lower extremity's.  She is trying to do some squat exercises for strength maintenance.  Based on her narcotic assessment sheet she continues to derive good functional lifestyle improvement with her medicines.  Otherwise she is in her usual state of health.  Outpatient Medications Prior to Visit  Medication Sig Dispense Refill  . albuterol (PROVENTIL HFA;VENTOLIN HFA) 108 (90 Base) MCG/ACT inhaler Inhale 2 puffs into the lungs every 6 (six) hours as needed for wheezing or shortness of breath.    Marland Kitchen aspirin EC 81 MG tablet Take 1 tablet (81 mg total) daily by mouth. 90 tablet 3  . carvedilol (COREG) 6.25 MG tablet Take 1 tablet (6.25 mg total) by mouth 2 (two) times daily. 180 tablet 3  . cyclobenzaprine (FLEXERIL) 10 MG tablet Take 1 tablet (10 mg total) by mouth 3 (three) times daily as needed. for muscle spams 270 tablet 1  . dexlansoprazole (DEXILANT) 60 MG capsule Take 60 mg by mouth daily.    . diclofenac sodium (VOLTAREN) 1 % GEL Apply 2 g topically 4 (four) times daily as needed (pain).     Marland Kitchen diphenhydrAMINE (BENADRYL) 25 mg capsule Take 25 mg by mouth every 8 (eight) hours as needed for allergies.     Marland Kitchen docusate sodium (COLACE) 100 MG capsule Take 200 mg by mouth at bedtime.     . folic acid (FOLVITE) 1 MG tablet Take 1 mg by mouth at bedtime.     . furosemide (LASIX) 40 MG tablet Take 2 tablets (80 mg) by mouth once daily as directed 180 tablet 3  . gabapentin (NEURONTIN) 300 MG capsule Take  2 capsules (600 mg total) by mouth 3 (three) times daily. 540 capsule 0  . ibuprofen (ADVIL,MOTRIN) 800 MG tablet Take 1 tablet (800 mg total) by mouth every 8 (eight) hours as needed. 270 tablet 1  . levocetirizine (XYZAL) 5 MG tablet Take 5 mg by mouth at bedtime.     . mirabegron ER (MYRBETRIQ) 50 MG TB24 tablet Take 50 mg by mouth daily.    . montelukast (SINGULAIR) 10 MG tablet Take 10 mg by mouth at bedtime.    . Multiple Vitamins-Minerals (MULTIVITAMIN GUMMIES ADULTS) CHEW Chew 2 tablets by mouth daily.     . nortriptyline (PAMELOR) 50 MG capsule Take 50 mg by mouth at bedtime.    Marland Kitchen nystatin (MYCOSTATIN) 100000 UNIT/ML suspension Take 5 mLs by mouth 4 (four) times daily as needed (thrush).     . nystatin cream (MYCOSTATIN) Apply 1 application topically 2 (two) times daily as needed (irritation).     . Omega-3 Fatty Acids (FISH OIL) 1000 MG CAPS Take 1 capsule by mouth every 3 (three) days. Take 1000mg  every 3rd day     . potassium chloride SA (K-DUR,KLOR-CON) 20 MEQ tablet Take 20 mEq by mouth See admin instructions. Take 20 meq every third day    . Probiotic Product (PROBIOTIC COLON SUPPORT) CAPS Take 1  capsule by mouth at bedtime.    . sacubitril-valsartan (ENTRESTO) 24-26 MG Take 1 tablet by mouth 2 (two) times daily. 180 tablet 3  . sucralfate (CARAFATE) 1 G tablet Take 1 g by mouth 2 (two) times daily as needed (esophageal burning).     . Vitamin D, Ergocalciferol, (DRISDOL) 50000 UNITS CAPS capsule Take 50,000 Units by mouth every 7 (seven) days. thursdays    . tiotropium (SPIRIVA) 18 MCG inhalation capsule Place 18 mcg into inhaler and inhale daily.     Marland Kitchen oxyCODONE-acetaminophen (PERCOCET) 7.5-325 MG tablet Take 1 tablet by mouth every 6 (six) hours as needed for severe pain. 120 tablet 0   No facility-administered medications prior to visit.     Review of Systems CNS: No confusion or sedation Cardiac: No angina or palpitations GI: No abdominal pain or  constipation Constitutional: No nausea vomiting fevers or chills  Objective:  BP (!) 114/92   Pulse 70   Temp 98.7 F (37.1 C) (Oral)   Resp 16   Ht 5\' 7"  (1.702 m)   Wt 293 lb (132.9 kg)   SpO2 100%   BMI 45.89 kg/m    BP Readings from Last 3 Encounters:  10/27/17 (!) 114/92  09/09/17 110/78  09/08/17 123/85     Wt Readings from Last 3 Encounters:  10/27/17 293 lb (132.9 kg)  09/09/17 (!) 300 lb 12 oz (136.4 kg)  09/08/17 295 lb (133.8 kg)     Physical Exam Pt is alert and oriented PERRL EOMI HEART IS RRR no murmur or rub LCTA no wheezing or rales MUSCULOSKELETAL reveals some paraspinous muscle tenderness but no overt trigger points.  Her muscle tone and bulk remains at baseline.  Labs  No results found for: HGBA1C Lab Results  Component Value Date   LDLCALC (H) 03/16/2007    150        Total Cholesterol/HDL:CHD Risk Coronary Heart Disease Risk Table                     Men   Women  1/2 Average Risk   3.4   3.3   CREATININE 0.80 09/20/2017    -------------------------------------------------------------------------------------------------------------------- Lab Results  Component Value Date   WBC 3.7 05/31/2017   HGB 13.4 05/31/2017   HCT 39.7 05/31/2017   PLT 283 05/31/2017   GLUCOSE 90 09/20/2017   CHOL (H) 03/16/2007    236        ATP III CLASSIFICATION:  <200     mg/dL   Desirable  200-239  mg/dL   Borderline High  >=240    mg/dL   High   TRIG 76 03/16/2007   HDL 71 03/16/2007   LDLCALC (H) 03/16/2007    150        Total Cholesterol/HDL:CHD Risk Coronary Heart Disease Risk Table                     Men   Women  1/2 Average Risk   3.4   3.3   ALT 11 02/25/2017   AST 13 02/25/2017   NA 146 (H) 09/20/2017   K 4.6 09/20/2017   CL 106 09/20/2017   CREATININE 0.80 09/20/2017   BUN 17 09/20/2017   CO2 25 09/20/2017   TSH 1.820 02/25/2017   INR 1.11 07/26/2015     --------------------------------------------------------------------------------------------------------------------- No results found.   Assessment & Plan:   Mireyah was seen today for back pain.  Diagnoses and all orders for this visit:  Chronic pain syndrome  Chronic, continuous use of opioids -     Discontinue: oxyCODONE-acetaminophen (PERCOCET) 7.5-325 MG tablet; Take 1 tablet by mouth every 6 (six) hours as needed for severe pain. -     oxyCODONE-acetaminophen (PERCOCET) 7.5-325 MG tablet; Take 1 tablet by mouth every 6 (six) hours as needed for severe pain.  Primary osteoarthritis of right shoulder  Chronic pain of right hip  Spinal stenosis of lumbar region with neurogenic claudication  Primary osteoarthritis of left knee  DDD (degenerative disc disease), lumbar  Chronic bilateral low back pain without sciatica        ----------------------------------------------------------------------------------------------------------------------  Problem List Items Addressed This Visit      Unprioritized   Chronic pain syndrome - Primary   Osteoarthritis of left knee   Relevant Medications   oxyCODONE-acetaminophen (PERCOCET) 7.5-325 MG tablet   Spinal stenosis of lumbar region    Other Visit Diagnoses    Chronic, continuous use of opioids       Relevant Medications   oxyCODONE-acetaminophen (PERCOCET) 7.5-325 MG tablet   Primary osteoarthritis of right shoulder       Relevant Medications   oxyCODONE-acetaminophen (PERCOCET) 7.5-325 MG tablet   Chronic pain of right hip       Relevant Medications   oxyCODONE-acetaminophen (PERCOCET) 7.5-325 MG tablet   DDD (degenerative disc disease), lumbar       Relevant Medications   oxyCODONE-acetaminophen (PERCOCET) 7.5-325 MG tablet   Chronic bilateral low back pain without sciatica       Relevant Medications   oxyCODONE-acetaminophen (PERCOCET) 7.5-325 MG tablet         ----------------------------------------------------------------------------------------------------------------------  1. Chronic pain syndrome I have her continue to do her stretching strengthening exercises as reviewed with her today.  Also refill her medications for October 30 and November 29 for her cassette 7.5 mg tablets.  We have reviewed the [facility] practitioner database information and it is appropriate.  We will have her scheduled for return to clinic in 2 months.  She does not feel that interventional therapy has given her any relief and this will not be scheduled at this point.  2. Chronic, continuous use of opioids  - oxyCODONE-acetaminophen (PERCOCET) 7.5-325 MG tablet; Take 1 tablet by mouth every 6 (six) hours as needed for severe pain.  Dispense: 120 tablet; Refill: 0  3. Primary osteoarthritis of right shoulder   4. Chronic pain of right hip   5. Spinal stenosis of lumbar region with neurogenic claudication   6. Primary osteoarthritis of left knee To new follow-up with her primary care physicians  7. DDD (degenerative disc disease), lumbar   8. Chronic bilateral low back pain without sciatica     ----------------------------------------------------------------------------------------------------------------------  I am having [name] T. Klauer maintain her dexlansoprazole, sucralfate, montelukast, diclofenac sodium, folic acid, mirabegron ER, potassium chloride SA, Vitamin D (Ergocalciferol), PROBIOTIC COLON SUPPORT, MULTIVITAMIN GUMMIES ADULTS, nortriptyline, levocetirizine, docusate sodium, albuterol, Fish Oil, tiotropium, aspirin EC, diphenhydrAMINE, nystatin, nystatin cream, cyclobenzaprine, gabapentin, ibuprofen, sacubitril-valsartan, carvedilol, furosemide, and oxyCODONE-acetaminophen.   Meds ordered this encounter  Medications  . DISCONTD: oxyCODONE-acetaminophen (PERCOCET) 7.5-325 MG tablet    Sig: Take 1 tablet by mouth every 6 (six)  hours as needed for severe pain.    Dispense:  120 tablet    Refill:  0    Do not fill until [id number]  . oxyCODONE-acetaminophen (PERCOCET) 7.5-325 MG tablet    Sig: Take 1 tablet by mouth every 6 (six) hours as needed for severe pain.    Dispense:  120 tablet    Refill:  0    Do not fill until [id number]   Patient's Medications  New Prescriptions   No medications on file  Previous Medications   ALBUTEROL (PROVENTIL HFA;VENTOLIN HFA) 108 (90 BASE) MCG/ACT INHALER    Inhale 2 puffs into the lungs every 6 (six) hours as needed for wheezing or shortness of breath.   ASPIRIN EC 81 MG TABLET    Take 1 tablet (81 mg total) daily by mouth.   CARVEDILOL (COREG) 6.25 MG TABLET    Take 1 tablet (6.25 mg total) by mouth 2 (two) times daily.   CYCLOBENZAPRINE (FLEXERIL) 10 MG TABLET    Take 1 tablet (10 mg total) by mouth 3 (three) times daily as needed. for muscle spams   DEXLANSOPRAZOLE (DEXILANT) 60 MG CAPSULE    Take 60 mg by mouth daily.   DICLOFENAC SODIUM (VOLTAREN) 1 % GEL    Apply 2 g topically 4 (four) times daily as needed (pain).    DIPHENHYDRAMINE (BENADRYL) 25 MG CAPSULE    Take 25 mg by mouth every 8 (eight) hours as needed for allergies.    DOCUSATE SODIUM (COLACE) 100 MG CAPSULE    Take 200 mg by mouth at bedtime.    FOLIC ACID (FOLVITE) 1 MG TABLET    Take 1 mg by mouth at bedtime.    FUROSEMIDE (LASIX) 40 MG TABLET    Take 2 tablets (80 mg) by mouth once daily as directed   GABAPENTIN (NEURONTIN) 300 MG CAPSULE    Take 2 capsules (600 mg total) by mouth 3 (three) times daily.   IBUPROFEN (ADVIL,MOTRIN) 800 MG TABLET    Take 1 tablet (800 mg total) by mouth every 8 (eight) hours as needed.   LEVOCETIRIZINE (XYZAL) 5 MG TABLET    Take 5 mg by mouth at bedtime.    MIRABEGRON ER (MYRBETRIQ) 50 MG TB24 TABLET    Take 50 mg by mouth daily.   MONTELUKAST (SINGULAIR) 10 MG TABLET    Take 10 mg by mouth at bedtime.   MULTIPLE VITAMINS-MINERALS (MULTIVITAMIN GUMMIES ADULTS) CHEW    Chew 2  tablets by mouth daily.    NORTRIPTYLINE (PAMELOR) 50 MG CAPSULE    Take 50 mg by mouth at bedtime.   NYSTATIN (MYCOSTATIN) 100000 UNIT/ML SUSPENSION    Take 5 mLs by mouth 4 (four) times daily as needed (thrush).    NYSTATIN CREAM (MYCOSTATIN)    Apply 1 application topically 2 (two) times daily as needed (irritation).    OMEGA-3 FATTY ACIDS (FISH OIL) 1000 MG CAPS    Take 1 capsule by mouth every 3 (three) days. Take 1000mg  every 3rd day    POTASSIUM CHLORIDE SA (K-DUR,KLOR-CON) 20 MEQ TABLET    Take 20 mEq by mouth See admin instructions. Take 20 meq every third day   PROBIOTIC PRODUCT (PROBIOTIC COLON SUPPORT) CAPS    Take 1 capsule by mouth at bedtime.   SACUBITRIL-VALSARTAN (ENTRESTO) 24-26 MG    Take 1 tablet by mouth 2 (two) times daily.   SUCRALFATE (CARAFATE) 1 G TABLET    Take 1 g by mouth 2 (two) times daily as needed (esophageal burning).    TIOTROPIUM (SPIRIVA) 18 MCG INHALATION CAPSULE    Place 18 mcg into inhaler and inhale daily.    VITAMIN D, ERGOCALCIFEROL, (DRISDOL) 50000 UNITS CAPS CAPSULE    Take 50,000 Units by mouth every 7 (seven) days. thursdays  Modified Medications   Modified Medication Previous Medication  OXYCODONE-ACETAMINOPHEN (PERCOCET) 7.5-325 MG TABLET oxyCODONE-acetaminophen (PERCOCET) 7.5-325 MG tablet      Take 1 tablet by mouth every 6 (six) hours as needed for severe pain.    Take 1 tablet by mouth every 6 (six) hours as needed for severe pain.  Discontinued Medications   No medications on file   ----------------------------------------------------------------------------------------------------------------------  Follow-up: Return in about 2 months (around 12/27/2017) for evaluation, med refill.    Molli Barrows, MD

## 2017-11-03 ENCOUNTER — Ambulatory Visit (INDEPENDENT_AMBULATORY_CARE_PROVIDER_SITE_OTHER): Payer: Medicare Other

## 2017-11-03 DIAGNOSIS — I493 Ventricular premature depolarization: Secondary | ICD-10-CM

## 2017-11-10 ENCOUNTER — Ambulatory Visit
Admission: RE | Admit: 2017-11-10 | Discharge: 2017-11-10 | Disposition: A | Discharge status: Home / Self Care | Payer: Medicare Other | Source: Ambulatory Visit | Attending: Internal Medicine | Admitting: Internal Medicine

## 2017-11-10 DIAGNOSIS — I493 Ventricular premature depolarization: Secondary | ICD-10-CM | POA: Diagnosis not present

## 2017-11-29 ENCOUNTER — Telehealth: Payer: Self-pay | Admitting: Internal Medicine

## 2017-11-29 NOTE — Telephone Encounter (Signed)
Estill Bamberg from [facility] calling  Patient is scheduled for a procedure for a cyst removal on Dec. 11th Patient is concerned about taking antibiotics due to her CHF diagnosis Dr. Laurence Ferrari states antibiotics are not neccessary unless there is an artificial heart value, they will be using local anesthesia Please call to discuss, call Estill Bamberg at [phone]

## 2017-11-29 NOTE — Telephone Encounter (Signed)
No answer. Left message to call back.   

## 2017-11-29 NOTE — Telephone Encounter (Signed)
Received incoming call from [place] at [facility]. Patient requested for them to call us to make sure she does not need an antibiotic prior to cyst removal in the office setting. Patient denies heart valve replacement per Estill Bamberg. Advised her that from a cardiac standpoint it does not appear patient needs antiobiotics.  Dr Laurence Ferrari does not feel patient meets his usual requirements for needing antibiotics. She was very appreciative and will let the patient know.

## 2017-12-16 ENCOUNTER — Ambulatory Visit (INDEPENDENT_AMBULATORY_CARE_PROVIDER_SITE_OTHER): Payer: Medicare Other | Admitting: Internal Medicine

## 2017-12-16 ENCOUNTER — Encounter: Payer: Self-pay | Admitting: Internal Medicine

## 2017-12-16 VITALS — BP 130/80 | HR 64 | Ht 67.0 in | Wt 299.0 lb

## 2017-12-16 DIAGNOSIS — I493 Ventricular premature depolarization: Secondary | ICD-10-CM

## 2017-12-16 DIAGNOSIS — I5022 Chronic systolic (congestive) heart failure: Secondary | ICD-10-CM

## 2017-12-16 DIAGNOSIS — I428 Other cardiomyopathies: Secondary | ICD-10-CM | POA: Diagnosis not present

## 2017-12-16 NOTE — Patient Instructions (Signed)
Medication Instructions:  - Your physician recommends that you continue on your current medications as directed. Please refer to the Current Medication list given to you today.  If you need a refill on your cardiac medications before your next appointment, please call your pharmacy.   Lab work: - none ordered  If you have labs (blood work) drawn today and your tests are completely normal, you will receive your results only by: Marland Kitchen MyChart Message (if you have MyChart) OR . A paper copy in the mail If you have any lab test that is abnormal or we need to change your treatment, we will call you to review the results.  Testing/Procedures: - none ordered  Follow-Up: At [facility], you and your health needs are our priority.  As part of our continuing mission to provide you with exceptional heart care, we have created designated Provider Care Teams.  These Care Teams include your primary Cardiologist (physician) and Advanced Practice Providers (APPs -  Physician Assistants and Nurse Practitioners) who all work together to provide you with the care you need, when you need it. . You will need a follow up appointment in 6 months with Dr. Caryl Comes.  Please call our office 2 months in advance to schedule this appointment.   Any Other Special Instructions Will Be Listed Below (If Applicable). - N/A

## 2017-12-16 NOTE — Progress Notes (Signed)
Patient Care Team: [name], MD as PCP - General (Family Medicine) Deboraha Sprang, MD as PCP - Cardiology (Cardiology)   HPI  Wendy Sanchez is a 67 y.o. 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  Initially seen in consultation 11/18  amiodarone was initiated   and subsequently ranolazine and then [facility] were added.  She is tolerating these medications well.  Blood pressures have been borderline but not with significant symptoms.  She underwent catheter ablation 4/19.  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  Ablation thus was limited but there is a marked reduction in PVC burden both in the lab and in follow-up with Dr. Elliot Cousin 6/19.   Less dyspnea, recently has reaccumulated peripheral edema.  She is also finding that she is having tenacious sputum difficult to expectorate.  Has a history of asthma  DATE TEST EF   6/18 Echo  30 % LVE/LAE  8/18 Cath   25 % Normal CAs  8/19 Echo  20-25%    Date Cr K Mg Hgb   9/18 0.8 4.5   12.8  2/19 1.10 5.0  13.4  9/19 0.8 4.6     DATE Holter PVCs meds  11/18 41% +amio + ranolazine  2/19 17% +entresto  3/19 24% amio d/c   10/19 4%          Past Medical History:  Diagnosis Date  . Allergic rhinitis   . Arthritis   . Asthma    problems with fumes and aerosols cause asthma  . Chest pain 10/19/2016  . Complication of anesthesia    awakens during surgery; has occurred with last 3-4 surgeries   . Environmental allergies    fumes   . Fibromyalgia   . GERD (gastroesophageal reflux disease)   . History of bronchitis   . Hx of total knee arthroplasty 12/13/2015  . Hypertension   . Morbid obesity with BMI of 45.0-49.9, adult ([place]) 07/25/2015  . NICM (nonischemic cardiomyopathy) ([place])    a. ? PVC mediated;  b. 06/2016 Echo: EF 25-30%, mild LVH, mild LAE, mild AI/MR/TR/PR; c. 08/2016 Cath: nl cors, EF 25%.  . Numbness    hands bilat when driving; improves when not preforming task   .  Osteoarthritis of left knee 08/02/2015  . Pes anserinus bursitis of left knee 05/18/2016  . Presence of right artificial knee joint 05/18/2016  . PVC (premature ventricular contraction) 06/04/2017  . PVC's (premature ventricular contractions)    a. 11/2016 Amio started;  b. 11/29/16 48h Holter: 01751 PVC's (41%); c. 12/2016 24h Holter: 18040 PVC's (15%); c. 02/2017 48h Holter: 37262 PVC's (41%).  Marland Kitchen Spinal stenosis of lumbar region 06/19/2015  . Status post gastric banding   . 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 Tinnitus    comes and goes   . Unilateral primary osteoarthritis, right knee 12/13/2015  . Vertigo    6 months ago approx     Past Surgical History:  Procedure Laterality Date  . ABDOMINAL HYSTERECTOMY  1979   left ovary remains  . ABDOMINAL HYSTERECTOMY    . CHOLECYSTECTOMY    . fibrous tissue removed from right shoulder and back of neck      2 years ago   . GANGLION CYST EXCISION    . GASTRIC BYPASS  1980   states had bypass with banding and band left in -  . KNEE SURGERY Bilateral    both knees  2001  .  PVC ABLATION N/A 06/04/2017   Procedure: PVC ABLATION;  Surgeon: Evans Lance, MD;  Location: [place] [facility];  Service: Cardiovascular;  Laterality: N/A;  . RIGHT/LEFT HEART CATH AND CORONARY ANGIOGRAPHY Bilateral 08/12/2016   Procedure: Right/Left Heart Cath and Coronary Angiography;  Surgeon: Yolonda Kida, MD;  Location: [place] [facility];  Service: Cardiovascular;  Laterality: Bilateral;  . TONSILLECTOMY     age 46  . TOTAL KNEE ARTHROPLASTY Left 08/02/2015   Procedure: LEFT TOTAL KNEE ARTHROPLASTY;  Surgeon: Mcarthur Rossetti, MD;  Location: [facility];  Service: Orthopedics;  Laterality: Left;  . TOTAL KNEE ARTHROPLASTY Right 12/13/2015   Procedure: RIGHT TOTAL KNEE ARTHROPLASTY;  Surgeon: Mcarthur Rossetti, MD;  Location: [facility];  Service: Orthopedics;  Laterality: Right;    Current Outpatient Medications  Medication Sig Dispense Refill   . albuterol (PROVENTIL HFA;VENTOLIN HFA) 108 (90 Base) MCG/ACT inhaler Inhale 2 puffs into the lungs every 6 (six) hours as needed for wheezing or shortness of breath.    Marland Kitchen aspirin EC 81 MG tablet Take 1 tablet (81 mg total) daily by mouth. 90 tablet 3  . budesonide-formoterol (SYMBICORT) 160-4.5 MCG/ACT inhaler Inhale into the lungs.    . carvedilol (COREG) 6.25 MG tablet Take 1 tablet (6.25 mg total) by mouth 2 (two) times daily. 180 tablet 3  . cyclobenzaprine (FLEXERIL) 10 MG tablet Take 1 tablet (10 mg total) by mouth 3 (three) times daily as needed. for muscle spams 270 tablet 1  . dexlansoprazole (DEXILANT) 60 MG capsule Take 60 mg by mouth daily.    . diclofenac sodium (VOLTAREN) 1 % GEL Apply 2 g topically 4 (four) times daily as needed (pain).     Marland Kitchen diphenhydrAMINE (BENADRYL) 25 mg capsule Take 25 mg by mouth every 8 (eight) hours as needed for allergies.     Marland Kitchen docusate sodium (COLACE) 100 MG capsule Take 200 mg by mouth at bedtime.     . folic acid (FOLVITE) 1 MG tablet Take 1 mg by mouth at bedtime.     . furosemide (LASIX) 40 MG tablet Take 2 tablets (80 mg) by mouth once daily as directed 180 tablet 3  . gabapentin (NEURONTIN) 300 MG capsule Take 2 capsules (600 mg total) by mouth 3 (three) times daily. 540 capsule 0  . guaiFENesin (MUCINEX) 600 MG 12 hr tablet Take 1,200 mg by mouth 2 (two) times daily.    Marland Kitchen ibuprofen (ADVIL,MOTRIN) 800 MG tablet Take 1 tablet (800 mg total) by mouth every 8 (eight) hours as needed. 270 tablet 1  . levocetirizine (XYZAL) 5 MG tablet Take 5 mg by mouth at bedtime.     . mirabegron ER (MYRBETRIQ) 50 MG TB24 tablet Take 50 mg by mouth daily.    . montelukast (SINGULAIR) 10 MG tablet Take 10 mg by mouth at bedtime.    . Multiple Vitamins-Minerals (MULTIVITAMIN GUMMIES ADULTS) CHEW Chew 2 tablets by mouth daily.     . nortriptyline (PAMELOR) 50 MG capsule Take 50 mg by mouth at bedtime.    Marland Kitchen nystatin cream (MYCOSTATIN) Apply 1 application topically 2  (two) times daily as needed (irritation).     . Omega-3 Fatty Acids (FISH OIL) 1000 MG CAPS Take 1 capsule by mouth every 3 (three) days. Take 1000mg  every 3rd day     . oxyCODONE-acetaminophen (PERCOCET) 7.5-325 MG tablet Take 1 tablet by mouth every 6 (six) hours as needed for severe pain. 120 tablet 0  . potassium chloride SA (K-DUR,KLOR-CON)  20 MEQ tablet Take 20 mEq by mouth See admin instructions. Take 20 meq every third day    . Probiotic Product (PROBIOTIC COLON SUPPORT) CAPS Take 1 capsule by mouth at bedtime.    . sacubitril-valsartan (ENTRESTO) 24-26 MG Take 1 tablet by mouth 2 (two) times daily. 180 tablet 3  . sucralfate (CARAFATE) 1 G tablet Take 1 g by mouth 2 (two) times daily as needed (esophageal burning).     . Vitamin D, Ergocalciferol, (DRISDOL) 50000 UNITS CAPS capsule Take 50,000 Units by mouth every 7 (seven) days. thursdays     No current facility-administered medications for this visit.     Allergies  Allergen Reactions  . Hydrocodone-Acetaminophen Swelling  . Iodine Anaphylaxis and Swelling  . Other Other (See Comments)    ALLERGY TO METAL - BLACKENS SKIN AND CAUSES A RASH   . Tape Swelling and Other (See Comments)    Skin comes off.  Paper tape is ok  . Shellfish Allergy Nausea And Vomiting and Other (See Comments)    Episode of GI infection after eating clam chowder. Still eats shrimp and other seafood      Review of Systems negative except from HPI and PMH  Physical Exam BP 130/80 (BP Location: Right Arm, Patient Position: Sitting, Cuff Size: Normal)   Pulse 64   Ht 5\' 7"  (1.702 m)   Wt 299 lb (135.6 kg)   BMI 46.83 kg/m  Well developed and Morbidly obese but no a makes cute distress HENT normal Neck supple with JVP unable to discern Wheezes B Regular rate and rhythm, no murmurs or gallops Abd-soft with active BS No Clubbing cyanosis 1-2+ edema Skin-warm and dry A & Oriented  Grossly normal sensory and motor function   ECG sinus @  73 19/09/39  Assessment and Plan:  Nonischemic cardiomyopathy persisting post ablation  PVCs-- s/p ablation largely extinguished     Left leg pain  Asthma-acute on chronic  Morbidly obese  Chronic systolic heart failure  She is volume overloaded.  We will increase her Lasix from 40--80 daily.  We will check a metabolic profile in 3 weeks.  With her leg pain and her obesity, we will undertake venous Doppler  I suggested that she use her albuterol inhaler and follow-up with Dr. Raul Del for her asthma  We will continue her on carvedilol and Entresto.  Addition of Aldactone at her next visit if blood pressure will tolerate it is in order  We spent more than 50% of our >25 min visit in face to face counseling regarding the above      Current medicines are reviewed at length with the patient today .  The patient does not have concerns regarding medicines.

## 2018-01-03 ENCOUNTER — Ambulatory Visit: Payer: Medicare Other | Attending: Anesthesiology | Admitting: Anesthesiology

## 2018-01-03 ENCOUNTER — Encounter: Payer: Self-pay | Admitting: Anesthesiology

## 2018-01-03 VITALS — BP 138/74 | HR 45 | Temp 98.2°F | Resp 16 | Ht 67.0 in | Wt 301.0 lb

## 2018-01-03 DIAGNOSIS — F1190 Opioid use, unspecified, uncomplicated: Secondary | ICD-10-CM

## 2018-01-03 DIAGNOSIS — Z79899 Other long term (current) drug therapy: Secondary | ICD-10-CM | POA: Insufficient documentation

## 2018-01-03 DIAGNOSIS — M25562 Pain in left knee: Secondary | ICD-10-CM | POA: Insufficient documentation

## 2018-01-03 DIAGNOSIS — M25531 Pain in right wrist: Secondary | ICD-10-CM | POA: Insufficient documentation

## 2018-01-03 DIAGNOSIS — G8929 Other chronic pain: Secondary | ICD-10-CM

## 2018-01-03 DIAGNOSIS — M25532 Pain in left wrist: Secondary | ICD-10-CM | POA: Insufficient documentation

## 2018-01-03 DIAGNOSIS — M48062 Spinal stenosis, lumbar region with neurogenic claudication: Secondary | ICD-10-CM

## 2018-01-03 DIAGNOSIS — Z791 Long term (current) use of non-steroidal anti-inflammatories (NSAID): Secondary | ICD-10-CM | POA: Insufficient documentation

## 2018-01-03 DIAGNOSIS — M25551 Pain in right hip: Secondary | ICD-10-CM

## 2018-01-03 DIAGNOSIS — Z7982 Long term (current) use of aspirin: Secondary | ICD-10-CM | POA: Insufficient documentation

## 2018-01-03 DIAGNOSIS — M25561 Pain in right knee: Secondary | ICD-10-CM | POA: Diagnosis not present

## 2018-01-03 DIAGNOSIS — M19011 Primary osteoarthritis, right shoulder: Secondary | ICD-10-CM | POA: Diagnosis not present

## 2018-01-03 DIAGNOSIS — Z7951 Long term (current) use of inhaled steroids: Secondary | ICD-10-CM | POA: Diagnosis not present

## 2018-01-03 DIAGNOSIS — G894 Chronic pain syndrome: Secondary | ICD-10-CM

## 2018-01-03 DIAGNOSIS — Z79891 Long term (current) use of opiate analgesic: Secondary | ICD-10-CM | POA: Diagnosis not present

## 2018-01-03 DIAGNOSIS — M542 Cervicalgia: Secondary | ICD-10-CM | POA: Diagnosis not present

## 2018-01-03 DIAGNOSIS — M545 Low back pain: Secondary | ICD-10-CM

## 2018-01-03 MED ORDER — OXYCODONE-ACETAMINOPHEN 7.5-325 MG PO TABS: 1.0000 | ORAL_TABLET | Freq: Four times a day (QID) | ORAL | 0 refills | Status: DC | PRN

## 2018-01-03 MED ORDER — IBUPROFEN 800 MG PO TABS: 800.0000 mg | ORAL_TABLET | Freq: Three times a day (TID) | ORAL | 1 refills | Status: DC | PRN

## 2018-01-03 NOTE — Progress Notes (Signed)
Nursing Pain Medication Assessment:  Safety precautions to be maintained throughout the outpatient stay will include: orient to surroundings, keep bed in low position, maintain call bell within reach at all times, provide assistance with transfer out of bed and ambulation.  Medication Inspection Compliance: Pill count conducted under aseptic conditions, in front of the patient. Neither the pills nor the bottle was removed from the patient's sight at any time. Once count was completed pills were immediately returned to the patient in their original bottle.  Medication: Oxycodone/APAP Pill/Patch Count: 17 of 120 pills remain Pill/Patch Appearance: Markings consistent with prescribed medication Bottle Appearance: Standard pharmacy container. Clearly labeled. Filled Date: 11/ 29 / 2019 Last Medication intake:  Today

## 2018-01-07 NOTE — Progress Notes (Signed)
Subjective:  Patient ID: Wendy Sanchez, female    DOB: 1950/02/10  Age: 67 y.o. MRN: [id number]  CC: Neck Pain (s/p surgery 12/16/17); Knee Pain (bilateral); and Wrist Pain (left mainly  )   Procedure:.  None   HPI Wendy Sanchez presents for reevaluation.  She was last seen approximately 2 months ago and is been doing well with her current regimen of pain management.  She is taking her medications as prescribed with no difficulties.  Based on her narcotic assessment sheet she continues to derive good functional lifestyle improvement with the medicines and no side effects.  The quality characteristic distribution of her low back pain and diffuse knee and wrist pain are stable in nature with no significant changes.  Her lower extremity strength and function are at baseline as well.  Outpatient Medications Prior to Visit  Medication Sig Dispense Refill  . albuterol (PROVENTIL HFA;VENTOLIN HFA) 108 (90 Base) MCG/ACT inhaler Inhale 2 puffs into the lungs every 6 (six) hours as needed for wheezing or shortness of breath.    Marland Kitchen aspirin EC 81 MG tablet Take 1 tablet (81 mg total) daily by mouth. 90 tablet 3  . carvedilol (COREG) 6.25 MG tablet Take 1 tablet (6.25 mg total) by mouth 2 (two) times daily. 180 tablet 3  . cyclobenzaprine (FLEXERIL) 10 MG tablet Take 1 tablet (10 mg total) by mouth 3 (three) times daily as needed. for muscle spams 270 tablet 1  . dexlansoprazole (DEXILANT) 60 MG capsule Take 60 mg by mouth daily.    . diclofenac sodium (VOLTAREN) 1 % GEL Apply 2 g topically 4 (four) times daily as needed (pain).     Marland Kitchen diphenhydrAMINE (BENADRYL) 25 mg capsule Take 25 mg by mouth every 8 (eight) hours as needed for allergies.     Marland Kitchen docusate sodium (COLACE) 100 MG capsule Take 200 mg by mouth at bedtime.     . folic acid (FOLVITE) 1 MG tablet Take 1 mg by mouth at bedtime.     . furosemide (LASIX) 40 MG tablet Take 2 tablets (80 mg) by mouth once daily as directed 180 tablet 3  .  gabapentin (NEURONTIN) 300 MG capsule Take 2 capsules (600 mg total) by mouth 3 (three) times daily. 540 capsule 0  . guaiFENesin (MUCINEX) 600 MG 12 hr tablet Take 1,200 mg by mouth 2 (two) times daily.    Marland Kitchen levocetirizine (XYZAL) 5 MG tablet Take 5 mg by mouth at bedtime.     . mirabegron ER (MYRBETRIQ) 50 MG TB24 tablet Take 50 mg by mouth daily.    . montelukast (SINGULAIR) 10 MG tablet Take 10 mg by mouth at bedtime.    . Multiple Vitamins-Minerals (MULTIVITAMIN GUMMIES ADULTS) CHEW Chew 2 tablets by mouth daily.     . nortriptyline (PAMELOR) 50 MG capsule Take 50 mg by mouth at bedtime.    Marland Kitchen nystatin cream (MYCOSTATIN) Apply 1 application topically 2 (two) times daily as needed (irritation).     . Omega-3 Fatty Acids (FISH OIL) 1000 MG CAPS Take 1 capsule by mouth every 3 (three) days. Take 1000mg  every 3rd day     . potassium chloride SA (K-DUR,KLOR-CON) 20 MEQ tablet Take 20 mEq by mouth See admin instructions. Take 20 meq every third day    . Probiotic Product (PROBIOTIC COLON SUPPORT) CAPS Take 1 capsule by mouth at bedtime.    . sacubitril-valsartan (ENTRESTO) 24-26 MG Take 1 tablet by mouth 2 (two) times daily. 180 tablet 3  .  sucralfate (CARAFATE) 1 G tablet Take 1 g by mouth 2 (two) times daily as needed (esophageal burning).     Marland Kitchen umeclidinium-vilanterol (ANORO ELLIPTA) 62.5-25 MCG/INH AEPB Inhale 1 puff into the lungs daily.    . Vitamin D, Ergocalciferol, (DRISDOL) 50000 UNITS CAPS capsule Take 50,000 Units by mouth every 7 (seven) days. thursdays    . ibuprofen (ADVIL,MOTRIN) 800 MG tablet Take 1 tablet (800 mg total) by mouth every 8 (eight) hours as needed. 270 tablet 1  . oxyCODONE-acetaminophen (PERCOCET) 7.5-325 MG tablet Take 1 tablet by mouth every 6 (six) hours as needed for severe pain. 120 tablet 0  . budesonide-formoterol (SYMBICORT) 160-4.5 MCG/ACT inhaler Inhale into the lungs.     No facility-administered medications prior to visit.     Review of Systems CNS: No  confusion or sedation Cardiac: No angina or palpitations GI: No abdominal pain or constipation Constitutional: No nausea vomiting fevers or chills  Objective:  BP 138/74 (BP Location: Left Arm, Patient Position: Sitting, Cuff Size: Large)   Pulse (!) 45   Temp 98.2 F (36.8 C) (Oral)   Resp 16   Ht 5\' 7"  (1.702 m)   Wt (!) 301 lb (136.5 kg)   SpO2 98%   BMI 47.14 kg/m    BP Readings from Last 3 Encounters:  01/03/18 138/74  12/16/17 130/80  10/27/17 (!) 114/92     Wt Readings from Last 3 Encounters:  01/03/18 (!) 301 lb (136.5 kg)  12/16/17 299 lb (135.6 kg)  10/27/17 293 lb (132.9 kg)     Physical Exam Pt is alert and oriented PERRL EOMI HEART IS RRR no murmur or rub LCTA no wheezing or rales MUSCULOSKELETAL reveals some paraspinous muscle tenderness in the low back but no overt trigger points.  She is ambulating with assistance needed.  Otherwise her lower extremity muscle tone and bulk is at baseline.  Labs  No results found for: HGBA1C Lab Results  Component Value Date   LDLCALC (H) 03/16/2007    150        Total Cholesterol/HDL:CHD Risk Coronary Heart Disease Risk Table                     Men   Women  1/2 Average Risk   3.4   3.3   CREATININE 0.80 09/20/2017    -------------------------------------------------------------------------------------------------------------------- Lab Results  Component Value Date   WBC 3.7 05/31/2017   HGB 13.4 05/31/2017   HCT 39.7 05/31/2017   PLT 283 05/31/2017   GLUCOSE 90 09/20/2017   CHOL (H) 03/16/2007    236        ATP III CLASSIFICATION:  <200     mg/dL   Desirable  200-239  mg/dL   Borderline High  >=240    mg/dL   High   TRIG 76 03/16/2007   HDL 71 03/16/2007   LDLCALC (H) 03/16/2007    150        Total Cholesterol/HDL:CHD Risk Coronary Heart Disease Risk Table                     Men   Women  1/2 Average Risk   3.4   3.3   ALT 11 02/25/2017   AST 13 02/25/2017   NA 146 (H) 09/20/2017   K 4.6  09/20/2017   CL 106 09/20/2017   CREATININE 0.80 09/20/2017   BUN 17 09/20/2017   CO2 25 09/20/2017   TSH 1.820 02/25/2017   INR 1.11  07/26/2015    --------------------------------------------------------------------------------------------------------------------- No results found.   Assessment & Plan:   Wendy Sanchez was seen today for neck pain, knee pain and wrist pain.  Diagnoses and all orders for this visit:  Chronic pain syndrome  Chronic, continuous use of opioids -     Discontinue: oxyCODONE-acetaminophen (PERCOCET) 7.5-325 MG tablet; Take 1 tablet by mouth every 6 (six) hours as needed for severe pain. -     oxyCODONE-acetaminophen (PERCOCET) 7.5-325 MG tablet; Take 1 tablet by mouth every 6 (six) hours as needed for severe pain.  Primary osteoarthritis of right shoulder  Spinal stenosis of lumbar region with neurogenic claudication  Chronic pain of right hip  Chronic bilateral low back pain without sciatica  Other orders -     ibuprofen (ADVIL,MOTRIN) 800 MG tablet; Take 1 tablet (800 mg total) by mouth every 8 (eight) hours as needed.        ----------------------------------------------------------------------------------------------------------------------  Problem List Items Addressed This Visit      Unprioritized   Chronic pain syndrome - Primary   Spinal stenosis of lumbar region    Other Visit Diagnoses    Chronic, continuous use of opioids       Relevant Medications   oxyCODONE-acetaminophen (PERCOCET) 7.5-325 MG tablet (Start on 02/08/2018)   Primary osteoarthritis of right shoulder       Relevant Medications   oxyCODONE-acetaminophen (PERCOCET) 7.5-325 MG tablet (Start on 02/08/2018)   ibuprofen (ADVIL,MOTRIN) 800 MG tablet   Chronic pain of right hip       Relevant Medications   oxyCODONE-acetaminophen (PERCOCET) 7.5-325 MG tablet (Start on 02/08/2018)   ibuprofen (ADVIL,MOTRIN) 800 MG tablet   Chronic bilateral low back pain without sciatica        Relevant Medications   oxyCODONE-acetaminophen (PERCOCET) 7.5-325 MG tablet (Start on 02/08/2018)   ibuprofen (ADVIL,MOTRIN) 800 MG tablet        ----------------------------------------------------------------------------------------------------------------------  1. Chronic pain syndrome We will refill her medications for December 29 and January 28 for the Percocet 7.5 mg tablets.  We have reviewed the [facility] practitioner database information and it is appropriate.  We will schedule her for return to clinic in 2 months.  2. Chronic, continuous use of opioids As above - oxyCODONE-acetaminophen (PERCOCET) 7.5-325 MG tablet; Take 1 tablet by mouth every 6 (six) hours as needed for severe pain.  Dispense: 120 tablet; Refill: 0  3. Primary osteoarthritis of right shoulder   4. Spinal stenosis of lumbar region with neurogenic claudication   5. Chronic pain of right hip   6. Chronic bilateral low back pain without sciatica     ----------------------------------------------------------------------------------------------------------------------  I am having Wendy Sanchez maintain her dexlansoprazole, sucralfate, montelukast, diclofenac sodium, folic acid, mirabegron ER, potassium chloride SA, Vitamin D (Ergocalciferol), PROBIOTIC COLON SUPPORT, MULTIVITAMIN GUMMIES ADULTS, nortriptyline, levocetirizine, docusate sodium, albuterol, Fish Oil, aspirin EC, diphenhydrAMINE, nystatin cream, cyclobenzaprine, gabapentin, sacubitril-valsartan, carvedilol, furosemide, budesonide-formoterol, guaiFENesin, umeclidinium-vilanterol, oxyCODONE-acetaminophen, and ibuprofen.   Meds ordered this encounter  Medications  . DISCONTD: oxyCODONE-acetaminophen (PERCOCET) 7.5-325 MG tablet    Sig: Take 1 tablet by mouth every 6 (six) hours as needed for severe pain.    Dispense:  120 tablet    Refill:  0    30 day supply.. No early refill  . oxyCODONE-acetaminophen (PERCOCET) 7.5-325 MG  tablet    Sig: Take 1 tablet by mouth every 6 (six) hours as needed for severe pain.    Dispense:  120 tablet    Refill:  0    30  day supply.. No early refill  . ibuprofen (ADVIL,MOTRIN) 800 MG tablet    Sig: Take 1 tablet (800 mg total) by mouth every 8 (eight) hours as needed.    Dispense:  270 tablet    Refill:  1    This is a 3 month supply   Patient's Medications  New Prescriptions   No medications on file  Previous Medications   ALBUTEROL (PROVENTIL HFA;VENTOLIN HFA) 108 (90 BASE) MCG/ACT INHALER    Inhale 2 puffs into the lungs every 6 (six) hours as needed for wheezing or shortness of breath.   ASPIRIN EC 81 MG TABLET    Take 1 tablet (81 mg total) daily by mouth.   BUDESONIDE-FORMOTEROL (SYMBICORT) 160-4.5 MCG/ACT INHALER    Inhale into the lungs.   CARVEDILOL (COREG) 6.25 MG TABLET    Take 1 tablet (6.25 mg total) by mouth 2 (two) times daily.   CYCLOBENZAPRINE (FLEXERIL) 10 MG TABLET    Take 1 tablet (10 mg total) by mouth 3 (three) times daily as needed. for muscle spams   DEXLANSOPRAZOLE (DEXILANT) 60 MG CAPSULE    Take 60 mg by mouth daily.   DICLOFENAC SODIUM (VOLTAREN) 1 % GEL    Apply 2 g topically 4 (four) times daily as needed (pain).    DIPHENHYDRAMINE (BENADRYL) 25 MG CAPSULE    Take 25 mg by mouth every 8 (eight) hours as needed for allergies.    DOCUSATE SODIUM (COLACE) 100 MG CAPSULE    Take 200 mg by mouth at bedtime.    FOLIC ACID (FOLVITE) 1 MG TABLET    Take 1 mg by mouth at bedtime.    FUROSEMIDE (LASIX) 40 MG TABLET    Take 2 tablets (80 mg) by mouth once daily as directed   GABAPENTIN (NEURONTIN) 300 MG CAPSULE    Take 2 capsules (600 mg total) by mouth 3 (three) times daily.   GUAIFENESIN (MUCINEX) 600 MG 12 HR TABLET    Take 1,200 mg by mouth 2 (two) times daily.   LEVOCETIRIZINE (XYZAL) 5 MG TABLET    Take 5 mg by mouth at bedtime.    MIRABEGRON ER (MYRBETRIQ) 50 MG TB24 TABLET    Take 50 mg by mouth daily.   MONTELUKAST (SINGULAIR) 10 MG TABLET    Take  10 mg by mouth at bedtime.   MULTIPLE VITAMINS-MINERALS (MULTIVITAMIN GUMMIES ADULTS) CHEW    Chew 2 tablets by mouth daily.    NORTRIPTYLINE (PAMELOR) 50 MG CAPSULE    Take 50 mg by mouth at bedtime.   NYSTATIN CREAM (MYCOSTATIN)    Apply 1 application topically 2 (two) times daily as needed (irritation).    OMEGA-3 FATTY ACIDS (FISH OIL) 1000 MG CAPS    Take 1 capsule by mouth every 3 (three) days. Take 1000mg  every 3rd day    POTASSIUM CHLORIDE SA (K-DUR,KLOR-CON) 20 MEQ TABLET    Take 20 mEq by mouth See admin instructions. Take 20 meq every third day   PROBIOTIC PRODUCT (PROBIOTIC COLON SUPPORT) CAPS    Take 1 capsule by mouth at bedtime.   SACUBITRIL-VALSARTAN (ENTRESTO) 24-26 MG    Take 1 tablet by mouth 2 (two) times daily.   SUCRALFATE (CARAFATE) 1 G TABLET    Take 1 g by mouth 2 (two) times daily as needed (esophageal burning).    UMECLIDINIUM-VILANTEROL (ANORO ELLIPTA) 62.5-25 MCG/INH AEPB    Inhale 1 puff into the lungs daily.   VITAMIN D, ERGOCALCIFEROL, (DRISDOL) 50000 UNITS CAPS CAPSULE  Take 50,000 Units by mouth every 7 (seven) days. thursdays  Modified Medications   Modified Medication Previous Medication   IBUPROFEN (ADVIL,MOTRIN) 800 MG TABLET ibuprofen (ADVIL,MOTRIN) 800 MG tablet      Take 1 tablet (800 mg total) by mouth every 8 (eight) hours as needed.    Take 1 tablet (800 mg total) by mouth every 8 (eight) hours as needed.   OXYCODONE-ACETAMINOPHEN (PERCOCET) 7.5-325 MG TABLET oxyCODONE-acetaminophen (PERCOCET) 7.5-325 MG tablet      Take 1 tablet by mouth every 6 (six) hours as needed for severe pain.    Take 1 tablet by mouth every 6 (six) hours as needed for severe pain.  Discontinued Medications   No medications on file   ----------------------------------------------------------------------------------------------------------------------  Follow-up: Return in about 2 months (around 03/06/2018) for evaluation, med refill.    Molli Barrows, MD

## 2018-01-14 ENCOUNTER — Telehealth: Payer: Self-pay | Admitting: Internal Medicine

## 2018-01-14 ENCOUNTER — Encounter: Payer: Self-pay | Admitting: Internal Medicine

## 2018-01-14 NOTE — Telephone Encounter (Signed)
Fax received from [facility] noting that the patient has had a "weight loss over 30 days."  Nurse notes: 5 lbs loss since last alert, 10 lbs in 1 month  Nurse Contact: Hardin Negus [phone] ext. [phone] or fax [phone]  Weight trend graph reviewed and this appears to be a steady decline in the patient's weight over the last month.   Max weight was ~ 298 lbs ( around 12/18) Min weight was ~ 284 lbs (around 01/12/18)  Last in office weight on 12/16/17 the patient was 299 lbs.  Lasix was increased from 40 mg once daily to 80 mg once daily per MD note.    I called and spoke with the patient to see how she was feeling. She states she is feeling a little weak. She confirms she has been taking lasix 40 mg once daily. She did take lasix 40 mg BID ~ 3 days ago due to feeling like she was retaining fluid.  Per the patient, she reports having quite a bit of loose stool over the last few weeks.  She does not classify this as diarrhea.  She reports she typically has problems with constipation, but has not required to take any colace in a a few weeks.  I have advised the patient I will review her trends with Dr. Caryl Comes and call her back with any further recommendations. I have also advised her if her bowel issues persist or if she develops actual diarrhea, then she will need to follow up with her PCP to ensure that her electrolytes stay in balance.   The patient voices understanding of the above and is agreeable.   To Dr. Caryl Comes to review.

## 2018-01-14 NOTE — Progress Notes (Signed)
error 

## 2018-01-18 NOTE — Telephone Encounter (Signed)
Not sure if this is a fluid issue or not.  But we have her hold her diuretics for 3 days.  If it is a fluid issue the weakness should resolve.  If not she should follow-up with her PCP.  And if it does resolve, and perhaps taking her diuretic every other day will be better tolerated. Lets check BMET and mag

## 2018-01-18 NOTE — Telephone Encounter (Signed)
I left a message for the patient to call. 

## 2018-01-19 NOTE — Telephone Encounter (Signed)
Returned the call to the patient. She stated that she was feeling much better and did not want to hold the furosemide at this time. She is currently taking 40 mg daily. She stated that her loose stool has ended as well.   She will call back if she starts feeling weak again and wants to hold the furosemide.

## 2018-01-19 NOTE — Telephone Encounter (Signed)
Patient returning call.

## 2018-01-19 NOTE — Telephone Encounter (Signed)
Left a message for the patient to call back.  

## 2018-01-20 NOTE — Telephone Encounter (Signed)
Noted  

## 2018-02-15 ENCOUNTER — Other Ambulatory Visit: Payer: Self-pay | Admitting: Gastroenterology

## 2018-02-15 DIAGNOSIS — Z9884 Bariatric surgery status: Secondary | ICD-10-CM

## 2018-02-15 DIAGNOSIS — R1310 Dysphagia, unspecified: Secondary | ICD-10-CM

## 2018-02-15 DIAGNOSIS — R1319 Other dysphagia: Secondary | ICD-10-CM

## 2018-02-17 ENCOUNTER — Ambulatory Visit
Admission: RE | Admit: 2018-02-17 | Discharge: 2018-02-17 | Disposition: A | Discharge status: Home / Self Care | Payer: Medicare Other | Source: Ambulatory Visit | Attending: Gastroenterology | Admitting: Gastroenterology

## 2018-02-17 DIAGNOSIS — Z9884 Bariatric surgery status: Secondary | ICD-10-CM | POA: Diagnosis present

## 2018-02-17 DIAGNOSIS — R1310 Dysphagia, unspecified: Secondary | ICD-10-CM | POA: Diagnosis present

## 2018-02-17 DIAGNOSIS — R1319 Other dysphagia: Secondary | ICD-10-CM

## 2018-03-03 ENCOUNTER — Ambulatory Visit: Payer: Medicare Other | Attending: Anesthesiology | Admitting: Anesthesiology

## 2018-03-03 ENCOUNTER — Encounter: Payer: Self-pay | Admitting: Anesthesiology

## 2018-03-03 ENCOUNTER — Other Ambulatory Visit: Payer: Self-pay

## 2018-03-03 VITALS — BP 151/89 | HR 71 | Temp 98.2°F | Resp 16 | Ht 67.0 in | Wt 291.0 lb

## 2018-03-03 DIAGNOSIS — G894 Chronic pain syndrome: Secondary | ICD-10-CM

## 2018-03-03 DIAGNOSIS — M545 Low back pain: Secondary | ICD-10-CM | POA: Diagnosis present

## 2018-03-03 DIAGNOSIS — F1190 Opioid use, unspecified, uncomplicated: Secondary | ICD-10-CM | POA: Diagnosis present

## 2018-03-03 DIAGNOSIS — M25551 Pain in right hip: Secondary | ICD-10-CM

## 2018-03-03 DIAGNOSIS — M48062 Spinal stenosis, lumbar region with neurogenic claudication: Secondary | ICD-10-CM | POA: Diagnosis present

## 2018-03-03 DIAGNOSIS — M19011 Primary osteoarthritis, right shoulder: Secondary | ICD-10-CM | POA: Insufficient documentation

## 2018-03-03 DIAGNOSIS — M1712 Unilateral primary osteoarthritis, left knee: Secondary | ICD-10-CM | POA: Insufficient documentation

## 2018-03-03 DIAGNOSIS — G8929 Other chronic pain: Secondary | ICD-10-CM | POA: Insufficient documentation

## 2018-03-03 MED ORDER — OXYCODONE-ACETAMINOPHEN 7.5-325 MG PO TABS: 1.0000 | ORAL_TABLET | Freq: Four times a day (QID) | ORAL | 0 refills | Status: DC | PRN

## 2018-03-03 NOTE — Patient Instructions (Signed)
Two prescriptions for Oxycodone were sent to your pharmacy.

## 2018-03-03 NOTE — Progress Notes (Signed)
Nursing Pain Medication Assessment:  Safety precautions to be maintained throughout the outpatient stay will include: orient to surroundings, keep bed in low position, maintain call bell within reach at all times, provide assistance with transfer out of bed and ambulation.  Medication Inspection Compliance: Pill count conducted under aseptic conditions, in front of the patient. Neither the pills nor the bottle was removed from the patient's sight at any time. Once count was completed pills were immediately returned to the patient in their original bottle.  Medication: Oxycodone/APAP Pill/Patch Count: 27 of 120 pills remain Pill/Patch Appearance: Markings consistent with prescribed medication Bottle Appearance: Standard pharmacy container. Clearly labeled. Filled Date: 01/28 / 2020 Last Medication intake:  Today

## 2018-03-08 LAB — TOXASSURE SELECT 13 (MW), URINE

## 2018-03-09 NOTE — Progress Notes (Signed)
Subjective:  Patient ID: Wendy Sanchez, female    DOB: [birth year]  Age: 68 y.o. MRN: [id number]  CC: Back Pain (lower); Shoulder Pain (bilateral); and Wrist Pain (left)   Procedure: None  HPI [name] presents for reevaluation.  She was last seen 2 months ago and is been doing well with her regimen of medication management.  No changes in her lower extremity strength or function are noted.  Her bowel bladder function is been stable.  The quality of her pain is been stable as well.  She still has hip pain low back pain and diffuse body pain of the same quality as previously documented.  I have reviewed her narcotic assessment sheet and she continues to derive good functional lifestyle improvement with the medicines and no side effects or mention.  Outpatient Medications Prior to Visit  Medication Sig Dispense Refill  . albuterol (PROVENTIL HFA;VENTOLIN HFA) 108 (90 Base) MCG/ACT inhaler Inhale 2 puffs into the lungs every 6 (six) hours as needed for wheezing or shortness of breath.    Marland Kitchen aspirin EC 81 MG tablet Take 1 tablet (81 mg total) daily by mouth. 90 tablet 3  . budesonide-formoterol (SYMBICORT) 160-4.5 MCG/ACT inhaler Inhale into the lungs.    . carvedilol (COREG) 6.25 MG tablet Take 1 tablet (6.25 mg total) by mouth 2 (two) times daily. 180 tablet 3  . cyclobenzaprine (FLEXERIL) 10 MG tablet Take 1 tablet (10 mg total) by mouth 3 (three) times daily as needed. for muscle spams 270 tablet 1  . dexlansoprazole (DEXILANT) 60 MG capsule Take 60 mg by mouth daily.    . diclofenac sodium (VOLTAREN) 1 % GEL Apply 2 g topically 4 (four) times daily as needed (pain).     Marland Kitchen diphenhydrAMINE (BENADRYL) 25 mg capsule Take 25 mg by mouth every 8 (eight) hours as needed for allergies.     Marland Kitchen docusate sodium (COLACE) 100 MG capsule Take 200 mg by mouth at bedtime.     . folic acid (FOLVITE) 1 MG tablet Take 1 mg by mouth at bedtime.     . furosemide (LASIX) 40 MG tablet Take 2 tablets (80 mg)  by mouth once daily as directed 180 tablet 3  . gabapentin (NEURONTIN) 300 MG capsule Take 2 capsules (600 mg total) by mouth 3 (three) times daily. 540 capsule 0  . guaiFENesin (MUCINEX) 600 MG 12 hr tablet Take 1,200 mg by mouth 2 (two) times daily.    Marland Kitchen ibuprofen (ADVIL,MOTRIN) 800 MG tablet Take 1 tablet (800 mg total) by mouth every 8 (eight) hours as needed. 270 tablet 1  . levocetirizine (XYZAL) 5 MG tablet Take 5 mg by mouth at bedtime.     . mirabegron ER (MYRBETRIQ) 50 MG TB24 tablet Take 50 mg by mouth daily.    . montelukast (SINGULAIR) 10 MG tablet Take 10 mg by mouth at bedtime.    . Multiple Vitamins-Minerals (MULTIVITAMIN GUMMIES ADULTS) CHEW Chew 2 tablets by mouth daily.     . nortriptyline (PAMELOR) 50 MG capsule Take 50 mg by mouth at bedtime.    Marland Kitchen nystatin cream (MYCOSTATIN) Apply 1 application topically 2 (two) times daily as needed (irritation).     . Omega-3 Fatty Acids (FISH OIL) 1000 MG CAPS Take 1 capsule by mouth every 3 (three) days. Take 1000mg  every 3rd day     . pantoprazole (PROTONIX) 40 MG tablet Take 40 mg by mouth 2 (two) times daily.    Marland Kitchen PEG 3350-KCl-Na Bicarb-NaCl (NULYTELY  PO) Take 4,000 mLs by mouth.    . potassium chloride SA (K-DUR,KLOR-CON) 20 MEQ tablet Take 20 mEq by mouth See admin instructions. Take 20 meq every third day    . sacubitril-valsartan (ENTRESTO) 24-26 MG Take 1 tablet by mouth 2 (two) times daily. 180 tablet 3  . sucralfate (CARAFATE) 1 G tablet Take 1 g by mouth 2 (two) times daily as needed (esophageal burning).     Marland Kitchen umeclidinium-vilanterol (ANORO ELLIPTA) 62.5-25 MCG/INH AEPB Inhale 1 puff into the lungs daily.    . Vitamin D, Ergocalciferol, (DRISDOL) 50000 UNITS CAPS capsule Take 50,000 Units by mouth every 7 (seven) days. thursdays    . oxyCODONE-acetaminophen (PERCOCET) 7.5-325 MG tablet Take 1 tablet by mouth every 6 (six) hours as needed for severe pain. 120 tablet 0  . Probiotic Product (PROBIOTIC COLON SUPPORT) CAPS Take 1  capsule by mouth at bedtime.     No facility-administered medications prior to visit.     Review of Systems CNS: No confusion or sedation Cardiac: No angina or palpitations GI: No abdominal pain or constipation Constitutional: No nausea vomiting fevers or chills  Objective:  BP (!) 151/89   Pulse 71   Temp 98.2 F (36.8 C) (Oral)   Resp 16   Ht 5\' 7"  (1.702 m)   Wt 291 lb (132 kg)   SpO2 98%   BMI 45.58 kg/m    BP Readings from Last 3 Encounters:  03/03/18 (!) 151/89  01/03/18 138/74  12/16/17 130/80     Wt Readings from Last 3 Encounters:  03/03/18 291 lb (132 kg)  01/03/18 (!) 301 lb (136.5 kg)  12/16/17 299 lb (135.6 kg)     Physical Exam Pt is alert and oriented PERRL EOMI HEART IS RRR no murmur or rub LCTA no wheezing or rales MUSCULOSKELETAL reveals some paraspinous muscle tenderness.  She walks with an antalgic gait.  Her muscle tone and bulk remains at baseline.  Labs  No results found for: HGBA1C Lab Results  Component Value Date   LDLCALC (H) 03/16/2007    150        Total Cholesterol/HDL:CHD Risk Coronary Heart Disease Risk Table                     Men   Women  1/2 Average Risk   3.4   3.3   CREATININE 0.80 09/20/2017    -------------------------------------------------------------------------------------------------------------------- Lab Results  Component Value Date   WBC 3.7 05/31/2017   HGB 13.4 05/31/2017   HCT 39.7 05/31/2017   PLT 283 05/31/2017   GLUCOSE 90 09/20/2017   CHOL (H) 03/16/2007    236        ATP III CLASSIFICATION:  <200     mg/dL   Desirable  200-239  mg/dL   Borderline High  >=240    mg/dL   High   TRIG 76 03/16/2007   HDL 71 03/16/2007   LDLCALC (H) 03/16/2007    150        Total Cholesterol/HDL:CHD Risk Coronary Heart Disease Risk Table                     Men   Women  1/2 Average Risk   3.4   3.3   ALT 11 02/25/2017   AST 13 02/25/2017   NA 146 (H) 09/20/2017   K 4.6 09/20/2017   CL 106  09/20/2017   CREATININE 0.80 09/20/2017   BUN 17 09/20/2017   CO2 25  09/20/2017   TSH 1.820 02/25/2017   INR 1.11 07/26/2015    --------------------------------------------------------------------------------------------------------------------- Dg Duanne Limerick W Single Cm (sol Or Thin Ba)  Result Date: 02/17/2018 CLINICAL DATA:  Esophageal scratched it dysphagia. History of gastric banding. EXAM: UPPER GI SERIES WITHOUT KUB TECHNIQUE: Routine upper GI series was performed with thin density barium. FLUOROSCOPY TIME:  Fluoroscopy Time:  2 minutes 12 seconds Radiation Exposure Index (if provided by the fluoroscopic device): 115 mGy COMPARISON:  11/16/2013. FINDINGS: Esophagus is widely patent. No evidence of reflux. Questionable gastric band defect noted unchanged from prior exam. No focal gastric abnormalities identified. No evidence of a gastric obstruction. Duodenum bulb and C-loop are normal. No reflux. Standard barium tablet passes normally. IMPRESSION: No acute abnormality. No esophageal obstruction or reflux. Standardized barium tablet passes normally. No acute gastric or duodenal abnormality. Electronically Signed   By: Marcello Moores  Register   On: 02/17/2018 10:43     Assessment & Plan:   Ithzel was seen today for back pain, shoulder pain and wrist pain.  Diagnoses and all orders for this visit:  Chronic pain syndrome -     ToxASSURE Select 13 (MW), Urine  Chronic, continuous use of opioids -     ToxASSURE Select 13 (MW), Urine -     oxyCODONE-acetaminophen (PERCOCET) 7.5-325 MG tablet; Take 1 tablet by mouth every 6 (six) hours as needed for up to 30 days for severe pain.  Primary osteoarthritis of right shoulder  Spinal stenosis of lumbar region with neurogenic claudication  Chronic pain of right hip  Chronic bilateral low back pain without sciatica  Primary osteoarthritis of left knee  Other orders -     oxyCODONE-acetaminophen (PERCOCET) 7.5-325 MG tablet; Take 1 tablet by mouth  every 6 (six) hours as needed for up to 30 days for moderate pain or severe pain.        ----------------------------------------------------------------------------------------------------------------------  Problem List Items Addressed This Visit      Unprioritized   Chronic pain syndrome - Primary   Relevant Orders   ToxASSURE Select 13 (MW), Urine (Completed)   Osteoarthritis of left knee   Relevant Medications   oxyCODONE-acetaminophen (PERCOCET) 7.5-325 MG tablet (Start on 03/10/2018)   oxyCODONE-acetaminophen (PERCOCET) 7.5-325 MG tablet (Start on 04/09/2018)   Spinal stenosis of lumbar region    Other Visit Diagnoses    Chronic, continuous use of opioids       Relevant Medications   oxyCODONE-acetaminophen (PERCOCET) 7.5-325 MG tablet (Start on 03/10/2018)   Other Relevant Orders   ToxASSURE Select 13 (MW), Urine (Completed)   Primary osteoarthritis of right shoulder       Relevant Medications   oxyCODONE-acetaminophen (PERCOCET) 7.5-325 MG tablet (Start on 03/10/2018)   oxyCODONE-acetaminophen (PERCOCET) 7.5-325 MG tablet (Start on 04/09/2018)   Chronic pain of right hip       Relevant Medications   oxyCODONE-acetaminophen (PERCOCET) 7.5-325 MG tablet (Start on 03/10/2018)   oxyCODONE-acetaminophen (PERCOCET) 7.5-325 MG tablet (Start on 04/09/2018)   Chronic bilateral low back pain without sciatica       Relevant Medications   oxyCODONE-acetaminophen (PERCOCET) 7.5-325 MG tablet (Start on 03/10/2018)   oxyCODONE-acetaminophen (PERCOCET) 7.5-325 MG tablet (Start on 04/09/2018)        ----------------------------------------------------------------------------------------------------------------------  1. Chronic pain syndrome Continue current medication management which seems to be working well for her.  We have gone over the risks and benefits of chronic opioid therapy.  We will give refills for February 27 and March 28.  I have reviewed the [facility] practitioner  database information and it is appropriate.  She is scheduled for return in 2 months. - ToxASSURE Select 13 (MW), Urine  2. Chronic, continuous use of opioids As above - ToxASSURE Select 13 (MW), Urine - oxyCODONE-acetaminophen (PERCOCET) 7.5-325 MG tablet; Take 1 tablet by mouth every 6 (six) hours as needed for up to 30 days for severe pain.  Dispense: 120 tablet; Refill: 0  3. Primary osteoarthritis of right shoulder   4. Spinal stenosis of lumbar region with neurogenic claudication Continue core stretching and strengthening.  5. Chronic pain of right hip   6. Chronic bilateral low back pain without sciatica   7. Primary osteoarthritis of left knee     ----------------------------------------------------------------------------------------------------------------------  I have changed [name] T. Doddridge's oxyCODONE-acetaminophen. I am also having her start on oxyCODONE-acetaminophen. Additionally, I am having her maintain her dexlansoprazole, sucralfate, montelukast, diclofenac sodium, folic acid, mirabegron ER, potassium chloride SA, Vitamin D (Ergocalciferol), PROBIOTIC COLON SUPPORT, MULTIVITAMIN GUMMIES ADULTS, nortriptyline, levocetirizine, docusate sodium, albuterol, Fish Oil, aspirin EC, diphenhydrAMINE, nystatin cream, cyclobenzaprine, gabapentin, sacubitril-valsartan, carvedilol, furosemide, budesonide-formoterol, guaiFENesin, umeclidinium-vilanterol, ibuprofen, pantoprazole, and PEG 3350-KCl-Na Bicarb-NaCl (NULYTELY PO).   Meds ordered this encounter  Medications  . oxyCODONE-acetaminophen (PERCOCET) 7.5-325 MG tablet    Sig: Take 1 tablet by mouth every 6 (six) hours as needed for up to 30 days for severe pain.    Dispense:  120 tablet    Refill:  0    30 day supply.. No early refill  . oxyCODONE-acetaminophen (PERCOCET) 7.5-325 MG tablet    Sig: Take 1 tablet by mouth every 6 (six) hours as needed for up to 30 days for moderate pain or severe pain.    Dispense:   120 tablet    Refill:  0    30 day supply   Patient's Medications  New Prescriptions   OXYCODONE-ACETAMINOPHEN (PERCOCET) 7.5-325 MG TABLET    Take 1 tablet by mouth every 6 (six) hours as needed for up to 30 days for moderate pain or severe pain.  Previous Medications   ALBUTEROL (PROVENTIL HFA;VENTOLIN HFA) 108 (90 BASE) MCG/ACT INHALER    Inhale 2 puffs into the lungs every 6 (six) hours as needed for wheezing or shortness of breath.   ASPIRIN EC 81 MG TABLET    Take 1 tablet (81 mg total) daily by mouth.   BUDESONIDE-FORMOTEROL (SYMBICORT) 160-4.5 MCG/ACT INHALER    Inhale into the lungs.   CARVEDILOL (COREG) 6.25 MG TABLET    Take 1 tablet (6.25 mg total) by mouth 2 (two) times daily.   CYCLOBENZAPRINE (FLEXERIL) 10 MG TABLET    Take 1 tablet (10 mg total) by mouth 3 (three) times daily as needed. for muscle spams   DEXLANSOPRAZOLE (DEXILANT) 60 MG CAPSULE    Take 60 mg by mouth daily.   DICLOFENAC SODIUM (VOLTAREN) 1 % GEL    Apply 2 g topically 4 (four) times daily as needed (pain).    DIPHENHYDRAMINE (BENADRYL) 25 MG CAPSULE    Take 25 mg by mouth every 8 (eight) hours as needed for allergies.    DOCUSATE SODIUM (COLACE) 100 MG CAPSULE    Take 200 mg by mouth at bedtime.    FOLIC ACID (FOLVITE) 1 MG TABLET    Take 1 mg by mouth at bedtime.    FUROSEMIDE (LASIX) 40 MG TABLET    Take 2 tablets (80 mg) by mouth once daily as directed   GABAPENTIN (NEURONTIN) 300 MG CAPSULE    Take 2 capsules (600 mg total) by  mouth 3 (three) times daily.   GUAIFENESIN (MUCINEX) 600 MG 12 HR TABLET    Take 1,200 mg by mouth 2 (two) times daily.   IBUPROFEN (ADVIL,MOTRIN) 800 MG TABLET    Take 1 tablet (800 mg total) by mouth every 8 (eight) hours as needed.   LEVOCETIRIZINE (XYZAL) 5 MG TABLET    Take 5 mg by mouth at bedtime.    MIRABEGRON ER (MYRBETRIQ) 50 MG TB24 TABLET    Take 50 mg by mouth daily.   MONTELUKAST (SINGULAIR) 10 MG TABLET    Take 10 mg by mouth at bedtime.   MULTIPLE VITAMINS-MINERALS  (MULTIVITAMIN GUMMIES ADULTS) CHEW    Chew 2 tablets by mouth daily.    NORTRIPTYLINE (PAMELOR) 50 MG CAPSULE    Take 50 mg by mouth at bedtime.   NYSTATIN CREAM (MYCOSTATIN)    Apply 1 application topically 2 (two) times daily as needed (irritation).    OMEGA-3 FATTY ACIDS (FISH OIL) 1000 MG CAPS    Take 1 capsule by mouth every 3 (three) days. Take 1000mg  every 3rd day    PANTOPRAZOLE (PROTONIX) 40 MG TABLET    Take 40 mg by mouth 2 (two) times daily.   PEG 3350-KCL-NA BICARB-NACL (NULYTELY PO)    Take 4,000 mLs by mouth.   POTASSIUM CHLORIDE SA (K-DUR,KLOR-CON) 20 MEQ TABLET    Take 20 mEq by mouth See admin instructions. Take 20 meq every third day   PROBIOTIC PRODUCT (PROBIOTIC COLON SUPPORT) CAPS    Take 1 capsule by mouth at bedtime.   SACUBITRIL-VALSARTAN (ENTRESTO) 24-26 MG    Take 1 tablet by mouth 2 (two) times daily.   SUCRALFATE (CARAFATE) 1 G TABLET    Take 1 g by mouth 2 (two) times daily as needed (esophageal burning).    UMECLIDINIUM-VILANTEROL (ANORO ELLIPTA) 62.5-25 MCG/INH AEPB    Inhale 1 puff into the lungs daily.   VITAMIN D, ERGOCALCIFEROL, (DRISDOL) 50000 UNITS CAPS CAPSULE    Take 50,000 Units by mouth every 7 (seven) days. thursdays  Modified Medications   Modified Medication Previous Medication   OXYCODONE-ACETAMINOPHEN (PERCOCET) 7.5-325 MG TABLET oxyCODONE-acetaminophen (PERCOCET) 7.5-325 MG tablet      Take 1 tablet by mouth every 6 (six) hours as needed for up to 30 days for severe pain.    Take 1 tablet by mouth every 6 (six) hours as needed for severe pain.  Discontinued Medications   No medications on file   ----------------------------------------------------------------------------------------------------------------------  Follow-up: Return in about 2 months (around 05/02/2018) for evaluation, med refill.    Molli Barrows, MD

## 2018-03-14 ENCOUNTER — Telehealth: Payer: Self-pay | Admitting: *Deleted

## 2018-03-14 NOTE — Telephone Encounter (Signed)
Wendy Sanchez has already spoken with [facility] and they are going to mail this to the patient.Thanks

## 2018-03-14 NOTE — Telephone Encounter (Signed)
Wendy Sanchez, this patient sees Dr. Andree Elk. The scripts went to [facility] instead of [facility]. I have called [facility] an cancelled both scripts for Percocet, to be filled 03/10/18 and 03/11/18. I spoke with Dr. Andree Elk, he asked me to ask you if you will resent these scripts to [facility]. Thanks

## 2018-03-16 ENCOUNTER — Telehealth: Payer: Self-pay | Admitting: Anesthesiology

## 2018-03-16 NOTE — Telephone Encounter (Signed)
Pt called and stated that she still hasn't been able to get her prescriptions that were sent to [facility]. I read previous notes about this prescription problem and the pt was demanding to speak to a nurse she states it is still messed up.

## 2018-03-17 ENCOUNTER — Other Ambulatory Visit: Payer: Self-pay | Admitting: Nurse Practitioner

## 2018-03-17 DIAGNOSIS — F1190 Opioid use, unspecified, uncomplicated: Secondary | ICD-10-CM

## 2018-03-17 MED ORDER — OXYCODONE-ACETAMINOPHEN 7.5-325 MG PO TABS: 1.0000 | ORAL_TABLET | Freq: Four times a day (QID) | ORAL | 0 refills | Status: AC | PRN

## 2018-03-17 MED ORDER — OXYCODONE-ACETAMINOPHEN 7.5-325 MG PO TABS: 1.0000 | ORAL_TABLET | Freq: Four times a day (QID) | ORAL | 0 refills | Status: DC | PRN

## 2018-03-17 NOTE — Telephone Encounter (Signed)
[facility] scripts cancelled. Crystal to e-scribe to [facility] in [place]. Patient called and aware.

## 2018-03-17 NOTE — Telephone Encounter (Signed)
Wendy Sanchez,            Please e-scribe this patient's oxycodone to [facility]. Thank you- Anderson Malta

## 2018-04-18 ENCOUNTER — Ambulatory Visit: Admission: RE | Admit: 2018-04-18 | Payer: Medicare Other | Source: Home / Self Care | Admitting: Gastroenterology

## 2018-04-18 ENCOUNTER — Encounter: Admission: RE | Payer: Self-pay | Source: Home / Self Care

## 2018-04-18 SURGERY — EGD (ESOPHAGOGASTRODUODENOSCOPY)
Anesthesia: General

## 2018-04-28 NOTE — Progress Notes (Signed)
Virtual Visit via Telephone Note  I connected with Wendy Sanchez on 04/28/18 at 12:15 PM EDT by telephone and verified that I am speaking with the correct person using two identifiers.   I discussed the limitations, risks, security and privacy concerns of performing an evaluation and management service by telephone and the availability of in person appointments. I also discussed with the patient that there may be a patient responsible charge related to this service. The patient expressed understanding and agreed to proceed.   History of Present Illness: Wendy Sanchez has been doing reasonably well in regards to her low back pain.  No significant changes in the quality characteristic or distribution of been noted.  She is taking her medications as prescribed and these continue to work well for her.  She has no side effects with the medications and gets good relief with her pain using them.  Otherwise she is in her usual state of health at this time.    Observations/Objective:   Assessment and Plan: 1. Chronic, continuous use of opioids   2. Chronic pain syndrome   3. Primary osteoarthritis of right shoulder   4. Spinal stenosis of lumbar region with neurogenic claudication   5. Chronic pain of right hip   6. Chronic bilateral low back pain without sciatica   7. Primary osteoarthritis of left knee   8. DDD (degenerative disc disease), lumbar   9. Sciatica, left side   Based on the above findings we will continue her on her current medication regimen with refills given today for the next 2 months.  Following the viral pandemic we will have her come in to clinic in 2 months for reevaluation.  In the meantime she is to continue with her current regimen with continued follow-up with her primary care physicians.   Follow Up Instructions:    I discussed the assessment and treatment plan with the patient. The patient was provided an opportunity to ask questions and all were answered. The patient  agreed with the plan and demonstrated an understanding of the instructions.   The patient was advised to call back or seek an in-person evaluation if the symptoms worsen or if the condition fails to improve as anticipated.  I provided 20 minutes of non-face-to-face time during this encounter.   Molli Barrows, MD

## 2018-04-29 ENCOUNTER — Other Ambulatory Visit: Payer: Self-pay

## 2018-04-29 ENCOUNTER — Encounter: Payer: Self-pay | Admitting: Anesthesiology

## 2018-04-29 ENCOUNTER — Ambulatory Visit: Payer: Medicare Other | Attending: Anesthesiology | Admitting: Anesthesiology

## 2018-04-29 DIAGNOSIS — F1190 Opioid use, unspecified, uncomplicated: Secondary | ICD-10-CM | POA: Diagnosis not present

## 2018-04-29 DIAGNOSIS — M5136 Other intervertebral disc degeneration, lumbar region: Secondary | ICD-10-CM

## 2018-04-29 DIAGNOSIS — M545 Low back pain: Secondary | ICD-10-CM

## 2018-04-29 DIAGNOSIS — M19011 Primary osteoarthritis, right shoulder: Secondary | ICD-10-CM

## 2018-04-29 DIAGNOSIS — M48062 Spinal stenosis, lumbar region with neurogenic claudication: Secondary | ICD-10-CM | POA: Diagnosis not present

## 2018-04-29 DIAGNOSIS — G8929 Other chronic pain: Secondary | ICD-10-CM

## 2018-04-29 DIAGNOSIS — G894 Chronic pain syndrome: Secondary | ICD-10-CM

## 2018-04-29 DIAGNOSIS — M1712 Unilateral primary osteoarthritis, left knee: Secondary | ICD-10-CM

## 2018-04-29 DIAGNOSIS — M5432 Sciatica, left side: Secondary | ICD-10-CM

## 2018-04-29 DIAGNOSIS — M51369 Other intervertebral disc degeneration, lumbar region without mention of lumbar back pain or lower extremity pain: Secondary | ICD-10-CM

## 2018-04-29 DIAGNOSIS — M25551 Pain in right hip: Secondary | ICD-10-CM

## 2018-04-29 MED ORDER — OXYCODONE-ACETAMINOPHEN 7.5-325 MG PO TABS: 1.0000 | ORAL_TABLET | Freq: Four times a day (QID) | ORAL | 0 refills | Status: DC | PRN

## 2018-05-02 ENCOUNTER — Encounter: Payer: Self-pay | Admitting: Anesthesiology

## 2018-05-02 MED ORDER — IBUPROFEN 800 MG PO TABS: 800.0000 mg | ORAL_TABLET | Freq: Three times a day (TID) | ORAL | 0 refills | Status: DC | PRN

## 2018-05-02 MED ORDER — OXYCODONE-ACETAMINOPHEN 7.5-325 MG PO TABS: 1.0000 | ORAL_TABLET | Freq: Four times a day (QID) | ORAL | 0 refills | Status: AC | PRN

## 2018-05-02 MED ORDER — GABAPENTIN 300 MG PO CAPS: 600.0000 mg | ORAL_CAPSULE | Freq: Three times a day (TID) | ORAL | 0 refills | Status: DC

## 2018-05-02 MED ORDER — OXYCODONE-ACETAMINOPHEN 7.5-325 MG PO TABS: 1.0000 | ORAL_TABLET | Freq: Four times a day (QID) | ORAL | 0 refills | Status: DC | PRN

## 2018-05-02 NOTE — Progress Notes (Signed)
Virtual Visit via Telephone Note  I connected with Wendy Sanchez on 05/02/18 at 12:45 PM EDT by telephone and verified that I am speaking with the correct person using two identifiers.   I discussed the limitations, risks, security and privacy concerns of performing an evaluation and management service by telephone and the availability of in person appointments. I also discussed with the patient that there may be a patient responsible charge related to this service. The patient expressed understanding and agreed to proceed.   History of Present Illness: I just completed a phone conversation with Wendy Sanchez regarding her diffuse pain.  She reports that she is doing reasonably well with her current regimen.  No new changes are noted.  She is having some more trouble with her left wrist.  She still hurting in the shoulders back and knees and hips.  The quality characteristic distribution of the pain are somewhat similar to what she is had in the past.  No significant changes are reported.  She still taking her Percocet successfully with no problems noted.  She is also taking her ibuprofen 3 times a day.  She occasionally takes 1/4 tablet/day and I cautioned her against this.  Furthermore she is taking her Neurontin as prescribed.  Otherwise no new changes are noted and per our previous narcotic assessment sheet she feels no changes are to be mentioned.    Observations/Objective:   Assessment and Plan: 1. Chronic, continuous use of opioids   2. Chronic pain syndrome   3. Primary osteoarthritis of right shoulder   4. Spinal stenosis of lumbar region with neurogenic claudication   5. Chronic pain of right hip   6. Chronic bilateral low back pain without sciatica   7. Primary osteoarthritis of left knee   8. DDD (degenerative disc disease), lumbar   Based on the above findings and per our discussion today we will refill her Percocet for May 4 and June 3.  We will have her return to clinic in 2 months.   I have reviewed the [facility] practitioner database information and it is appropriate.  I have also requested she continue follow-up with her primary care physicians for baseline medical care and to contact us should she have any problems in the meantime.  This Dr. Andree Elk dictating   Follow Up Instructions:    I discussed the assessment and treatment plan with the patient. The patient was provided an opportunity to ask questions and all were answered. The patient agreed with the plan and demonstrated an understanding of the instructions.   The patient was advised to call back or seek an in-person evaluation if the symptoms worsen or if the condition fails to improve as anticipated.  I provided 20 minutes of non-face-to-face time during this encounter.   Molli Barrows, MD

## 2018-05-03 ENCOUNTER — Other Ambulatory Visit: Payer: Self-pay

## 2018-05-03 ENCOUNTER — Ambulatory Visit: Payer: Medicare Other | Attending: Anesthesiology | Admitting: Anesthesiology

## 2018-05-03 DIAGNOSIS — M1712 Unilateral primary osteoarthritis, left knee: Secondary | ICD-10-CM

## 2018-05-03 DIAGNOSIS — M19011 Primary osteoarthritis, right shoulder: Secondary | ICD-10-CM | POA: Diagnosis not present

## 2018-05-03 DIAGNOSIS — F1190 Opioid use, unspecified, uncomplicated: Secondary | ICD-10-CM | POA: Diagnosis not present

## 2018-05-03 DIAGNOSIS — G8929 Other chronic pain: Secondary | ICD-10-CM

## 2018-05-03 DIAGNOSIS — M48062 Spinal stenosis, lumbar region with neurogenic claudication: Secondary | ICD-10-CM | POA: Diagnosis not present

## 2018-05-03 DIAGNOSIS — M545 Low back pain: Secondary | ICD-10-CM

## 2018-05-03 DIAGNOSIS — G894 Chronic pain syndrome: Secondary | ICD-10-CM | POA: Diagnosis not present

## 2018-05-03 DIAGNOSIS — M25551 Pain in right hip: Secondary | ICD-10-CM

## 2018-05-03 DIAGNOSIS — M5136 Other intervertebral disc degeneration, lumbar region: Secondary | ICD-10-CM

## 2018-06-30 ENCOUNTER — Encounter: Payer: Self-pay | Admitting: Anesthesiology

## 2018-06-30 ENCOUNTER — Other Ambulatory Visit: Payer: Self-pay

## 2018-06-30 ENCOUNTER — Ambulatory Visit: Payer: Medicare Other | Attending: Anesthesiology | Admitting: Anesthesiology

## 2018-06-30 DIAGNOSIS — M51369 Other intervertebral disc degeneration, lumbar region without mention of lumbar back pain or lower extremity pain: Secondary | ICD-10-CM

## 2018-06-30 DIAGNOSIS — M5442 Lumbago with sciatica, left side: Secondary | ICD-10-CM

## 2018-06-30 DIAGNOSIS — M545 Low back pain, unspecified: Secondary | ICD-10-CM | POA: Insufficient documentation

## 2018-06-30 DIAGNOSIS — M1711 Unilateral primary osteoarthritis, right knee: Secondary | ICD-10-CM

## 2018-06-30 DIAGNOSIS — M5441 Lumbago with sciatica, right side: Secondary | ICD-10-CM

## 2018-06-30 DIAGNOSIS — G8929 Other chronic pain: Secondary | ICD-10-CM

## 2018-06-30 DIAGNOSIS — M25551 Pain in right hip: Secondary | ICD-10-CM

## 2018-06-30 DIAGNOSIS — F1190 Opioid use, unspecified, uncomplicated: Secondary | ICD-10-CM

## 2018-06-30 DIAGNOSIS — M5136 Other intervertebral disc degeneration, lumbar region: Secondary | ICD-10-CM | POA: Insufficient documentation

## 2018-06-30 DIAGNOSIS — M25552 Pain in left hip: Secondary | ICD-10-CM

## 2018-06-30 DIAGNOSIS — M1712 Unilateral primary osteoarthritis, left knee: Secondary | ICD-10-CM | POA: Diagnosis not present

## 2018-06-30 MED ORDER — IBUPROFEN 800 MG PO TABS: 800.0000 mg | ORAL_TABLET | Freq: Three times a day (TID) | ORAL | 0 refills | Status: DC | PRN

## 2018-06-30 MED ORDER — OXYCODONE-ACETAMINOPHEN 7.5-325 MG PO TABS: 1.0000 | ORAL_TABLET | Freq: Four times a day (QID) | ORAL | 0 refills | Status: DC | PRN

## 2018-06-30 NOTE — Patient Instructions (Signed)
Virtual Visit via Telephone Note  I connected with Wendy Sanchez on @TODAY @ at  1:30 PM EDT by telephone and verified that I am speaking with the correct person using two identifiers.  Location: Patient: Home Provider: Pain control center   I discussed the limitations, risks, security and privacy concerns of performing an evaluation and management service by telephone and the availability of in person appointments. I also discussed with the patient that there may be a patient responsible charge related to this service. The patient expressed understanding and agreed to proceed.   History of Present Illness: I spoke with Wendy Sanchez via telephone conferencing today.  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  She states that she has been doing reasonably well during this COVID outbreak.  She still having significant shoulder low back hip and ankle pain consistent with what she has experienced in the past.  She is taking her medications as prescribed and these give her relief but it is still with breakthrough pain but overall she feels that she does derive significant benefits from her opioid therapy.  She reports no side effects.  The quality of the pain is been stable in nature and no new changes in lower extremity strength or function are noted.  Her bowel bladder function is been stable.  She still takes her ibuprofen and denies any side effects with that either.  Otherwise she is in her usual state of health.    Observations/Objective:  Current Outpatient Medications:  .  albuterol (PROVENTIL HFA;VENTOLIN HFA) 108 (90 Base) MCG/ACT inhaler, Inhale 2 puffs into the lungs every 6 (six) hours as needed for wheezing or shortness of breath., Disp: , Rfl:  .  aspirin EC 81 MG tablet, Take 1 tablet (81 mg total) daily by mouth., Disp: 90 tablet, Rfl: 3 .  budesonide-formoterol (SYMBICORT) 160-4.5 MCG/ACT inhaler, Inhale into the lungs., Disp: , Rfl:  .  carvedilol (COREG) 6.25 MG tablet,  Take 1 tablet (6.25 mg total) by mouth 2 (two) times daily., Disp: 180 tablet, Rfl: 3 .  cyclobenzaprine (FLEXERIL) 10 MG tablet, Take 1 tablet (10 mg total) by mouth 3 (three) times daily as needed. for muscle spams, Disp: 270 tablet, Rfl: 1 .  dexlansoprazole (DEXILANT) 60 MG capsule, Take 60 mg by mouth daily., Disp: , Rfl:  .  diclofenac sodium (VOLTAREN) 1 % GEL, Apply 2 g topically 4 (four) times daily as needed (pain). , Disp: , Rfl:  .  diphenhydrAMINE (BENADRYL) 25 mg capsule, Take 25 mg by mouth every 8 (eight) hours as needed for allergies. , Disp: , Rfl:  .  docusate sodium (COLACE) 100 MG capsule, Take 200 mg by mouth at bedtime. , Disp: , Rfl:  .  folic acid (FOLVITE) 1 MG tablet, Take 1 mg by mouth at bedtime. , Disp: , Rfl:  .  furosemide (LASIX) 40 MG tablet, Take 2 tablets (80 mg) by mouth once daily as directed, Disp: 180 tablet, Rfl: 3 .  gabapentin (NEURONTIN) 300 MG capsule, Take 2 capsules (600 mg total) by mouth 3 (three) times daily., Disp: 540 capsule, Rfl: 0 .  guaiFENesin (MUCINEX) 600 MG 12 hr tablet, Take 1,200 mg by mouth 2 (two) times daily., Disp: , Rfl:  .  ibuprofen (ADVIL) 800 MG tablet, Take 1 tablet (800 mg total) by mouth every 8 (eight) hours as needed for moderate pain., Disp: 270 tablet, Rfl: 0 .  levocetirizine (XYZAL) 5 MG tablet, Take 5 mg by mouth at bedtime. , Disp: ,  Rfl:  .  mirabegron ER (MYRBETRIQ) 50 MG TB24 tablet, Take 50 mg by mouth daily., Disp: , Rfl:  .  montelukast (SINGULAIR) 10 MG tablet, Take 10 mg by mouth at bedtime., Disp: , Rfl:  .  Multiple Vitamins-Minerals (MULTIVITAMIN GUMMIES ADULTS) CHEW, Chew 2 tablets by mouth daily. , Disp: , Rfl:  .  nortriptyline (PAMELOR) 50 MG capsule, Take 50 mg by mouth at bedtime., Disp: , Rfl:  .  nystatin cream (MYCOSTATIN), Apply 1 application topically 2 (two) times daily as needed (irritation). , Disp: , Rfl:  .  Omega-3 Fatty Acids (FISH OIL) 1000 MG CAPS, Take 1 capsule by mouth every 3 (three)  days. Take 1000mg  every 3rd day , Disp: , Rfl:  .  [START ON 07/20/2018] oxyCODONE-acetaminophen (PERCOCET) 7.5-325 MG tablet, Take 1 tablet by mouth every 6 (six) hours as needed for up to 30 days for moderate pain or severe pain., Disp: 120 tablet, Rfl: 0 .  [START ON 08/19/2018] oxyCODONE-acetaminophen (PERCOCET) 7.5-325 MG tablet, Take 1 tablet by mouth every 6 (six) hours as needed for up to 30 days for moderate pain or severe pain., Disp: 120 tablet, Rfl: 0 .  pantoprazole (PROTONIX) 40 MG tablet, Take 40 mg by mouth 2 (two) times daily., Disp: , Rfl:  .  PEG 3350-KCl-Na Bicarb-NaCl (NULYTELY PO), Take 4,000 mLs by mouth., Disp: , Rfl:  .  potassium chloride SA (K-DUR,KLOR-CON) 20 MEQ tablet, Take 20 mEq by mouth See admin instructions. Take 20 meq every third day, Disp: , Rfl:  .  Probiotic Product (PROBIOTIC COLON SUPPORT) CAPS, Take 1 capsule by mouth at bedtime., Disp: , Rfl:  .  sacubitril-valsartan (ENTRESTO) 24-26 MG, Take 1 tablet by mouth 2 (two) times daily., Disp: 180 tablet, Rfl: 3 .  sucralfate (CARAFATE) 1 G tablet, Take 1 g by mouth 2 (two) times daily as needed (esophageal burning). , Disp: , Rfl:  .  umeclidinium-vilanterol (ANORO ELLIPTA) 62.5-25 MCG/INH AEPB, Inhale 1 puff into the lungs daily., Disp: , Rfl:  .  Vitamin D, Ergocalciferol, (DRISDOL) 50000 UNITS CAPS capsule, Take 50,000 Units by mouth every 7 (seven) days. thursdays, Disp: , Rfl:    Assessment and Plan: 1. Unilateral primary osteoarthritis, right knee   2. Osteoarthritis of left knee, unspecified osteoarthritis type   3. DDD (degenerative disc disease), lumbar   4. Chronic bilateral low back pain with bilateral sciatica   5. Hip pain, bilateral   6. Chronic, continuous use of opioids   Based on our discussion today and upon review of the [place] practitioner database information I am going to refill her opioids dated July 8 and August 7.  I am also going to refill her ibuprofen at 800 mg strength 3  times a day.  I talked to her about risks of GI and kidney function with this and she understands that.  She denies any side effects.  I have also asked her to continue follow-up with her primary care physician for baseline medical care.  Should she have any problems with her pain control she is to contact us at the pain control center and she is scheduled for follow-up in 2 months   Follow Up Instructions:    I discussed the assessment and treatment plan with the patient. The patient was provided an opportunity to ask questions and all were answered. The patient agreed with the plan and demonstrated an understanding of the instructions.   The patient was advised to call back or seek an  in-person evaluation if the symptoms worsen or if the condition fails to improve as anticipated.  I provided 30 minutes of non-face-to-face time during this encounter.   Molli Barrows, MD

## 2018-07-03 ENCOUNTER — Other Ambulatory Visit: Payer: Self-pay | Admitting: Anesthesiology

## 2018-07-04 ENCOUNTER — Ambulatory Visit (INDEPENDENT_AMBULATORY_CARE_PROVIDER_SITE_OTHER): Payer: Medicare Other

## 2018-07-04 ENCOUNTER — Ambulatory Visit
Admission: EM | Admit: 2018-07-04 | Discharge: 2018-07-04 | Disposition: A | Discharge status: Home / Self Care | Payer: Medicare Other | Attending: Emergency Medicine | Admitting: Emergency Medicine

## 2018-07-04 ENCOUNTER — Encounter: Payer: Self-pay | Admitting: Emergency Medicine

## 2018-07-04 ENCOUNTER — Other Ambulatory Visit: Payer: Self-pay

## 2018-07-04 DIAGNOSIS — R0602 Shortness of breath: Secondary | ICD-10-CM | POA: Insufficient documentation

## 2018-07-04 DIAGNOSIS — J441 Chronic obstructive pulmonary disease with (acute) exacerbation: Secondary | ICD-10-CM | POA: Diagnosis present

## 2018-07-04 DIAGNOSIS — R079 Chest pain, unspecified: Secondary | ICD-10-CM

## 2018-07-04 LAB — BASIC METABOLIC PANEL
Anion gap: 9 (ref 5–15)
BUN: 19 mg/dL (ref 8–23)
CO2: 25 mmol/L (ref 22–32)
Calcium: 8.9 mg/dL (ref 8.9–10.3)
Chloride: 103 mmol/L (ref 98–111)
Creatinine, Ser: 0.95 mg/dL (ref 0.44–1.00)
GFR calc Af Amer: >60 mL/min (ref >60)
GFR calc non Af Amer: >60 mL/min (ref >60)
Glucose, Bld: 115 mg/dL — ABNORMAL HIGH (ref 70–99)
Potassium: 3.8 mmol/L (ref 3.5–5.1)
Sodium: 137 mmol/L (ref 135–145)

## 2018-07-04 LAB — CBC WITH DIFFERENTIAL/PLATELET
Abs Immature Granulocytes: 0.02 10*3/uL (ref 0.00–0.07)
Basophils Absolute: 0 10*3/uL (ref 0.0–0.1)
Basophils Relative: 1 %
Eosinophils Absolute: 0.1 10*3/uL (ref 0.0–0.5)
Eosinophils Relative: 1 %
HCT: 42.6 % (ref 36.0–46.0)
Hemoglobin: 14 g/dL (ref 12.0–15.0)
Immature Granulocytes: 0 %
Lymphocytes Relative: 24 %
Lymphs Abs: 1.4 10*3/uL (ref 0.7–4.0)
MCH: 29.7 pg (ref 26.0–34.0)
MCHC: 32.9 g/dL (ref 30.0–36.0)
MCV: 90.4 fL (ref 80.0–100.0)
Monocytes Absolute: 0.7 10*3/uL (ref 0.1–1.0)
Monocytes Relative: 11 %
Neutro Abs: 3.7 10*3/uL (ref 1.7–7.7)
Neutrophils Relative %: 63 %
Platelets: 193 10*3/uL (ref 150–400)
RBC: 4.71 MIL/uL (ref 3.87–5.11)
RDW: 13.7 % (ref 11.5–15.5)
WBC: 5.9 10*3/uL (ref 4.0–10.5)
nRBC: 0 % (ref 0.0–0.2)

## 2018-07-04 LAB — BRAIN NATRIURETIC PEPTIDE: B Natriuretic Peptide: 180 pg/mL — ABNORMAL HIGH (ref 0.0–100.0)

## 2018-07-04 MED ORDER — PREDNISONE 20 MG PO TABS: 40.0000 mg | ORAL_TABLET | Freq: Every day | ORAL | 0 refills | Status: AC

## 2018-07-04 NOTE — ED Provider Notes (Signed)
[facility]    CSN: [id number] Arrival date & time: 07/04/18  1041     History   Chief Complaint Chief Complaint  Patient presents with  . Shortness of Breath    HPI Wendy Sanchez is a 68 y.o. female.   Wendy Sanchez presents with complaints of increased shortness of breath. Activity increases her shortness of breath. Sweating with shortness of breath. Noticed this two nights ago. Noticed her BP was elevated during episode of shortness of breath. It did improve with rest. Currently: pain to left chest with deep breathing. Pain to posterior shoulder blade. Has had issues with breathing in the past, hx of PVC's and states it felt similar. Hx of ablation. This chest pain is worse than she has had before. Denies any palpitations. Hx of CHF, follows with cardiology. Per chart review EF of 25%. A few days ago gained 4lbs in 1 day, after eating a can of mushroom soup, she took double her furosemide as directed by her PCP, and swelling improved. Weight is now WNL. Has had some increased nasal congestion. Hx of seasonal allergies. Hx of COPD, using maintenance and PRN inhalers. Albuterol inhaler does help some, hasn't used today. No fever. Has had headache, last was two days ago, denies currently. No new body aches. Has had a cough. This increases pain. Cough is productive of sputum. States was on blood thinner after a DVT in the past s/p heart cath, no longer on a blood thinner. States was told her cardiac cath looks well.  No known ill contacts. Retired. Lives alone, her children do visit her, they have been working but not ill. Hx of chronic pain, ht, gerd, asthma/COPD, cardiomyopathy, PVC's.     ROS per HPI, negative if not otherwise mentioned.      Past Medical History:  Diagnosis Date  . Allergic rhinitis   . Arthritis   . Asthma    problems with fumes and aerosols cause asthma  . Chest pain 10/19/2016  . Complication of anesthesia    awakens during surgery; has  occurred with last 3-4 surgeries   . Environmental allergies    fumes   . Fibromyalgia   . GERD (gastroesophageal reflux disease)   . History of bronchitis   . Hx of total knee arthroplasty 12/13/2015  . Hypertension   . Morbid obesity with BMI of 45.0-49.9, adult ([place]) 07/25/2015  . NICM (nonischemic cardiomyopathy) ([place])    a. ? PVC mediated;  b. 06/2016 Echo: EF 25-30%, mild LVH, mild LAE, mild AI/MR/TR/PR; c. 08/2016 Cath: nl cors, EF 25%.  . Numbness    hands bilat when driving; improves when not preforming task   . Osteoarthritis of left knee 08/02/2015  . Pes anserinus bursitis of left knee 05/18/2016  . Presence of right artificial knee joint 05/18/2016  . PVC (premature ventricular contraction) 06/04/2017  . PVC's (premature ventricular contractions)    a. 11/2016 Amio started;  b. 11/29/16 48h Holter: 82993 PVC's (41%); c. 12/2016 24h Holter: 18040 PVC's (15%); c. 02/2017 48h Holter: 37262 PVC's (41%).  Marland Kitchen Spinal stenosis of lumbar region 06/19/2015  . Status post gastric banding   . 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 Tinnitus    comes and goes   . Unilateral primary osteoarthritis, right knee 12/13/2015  . Vertigo    6 months ago approx     Patient Active Problem List   Diagnosis Date Noted  . DDD (degenerative disc disease), lumbar 06/30/2018  .  Chronic low back pain 06/30/2018  . Hip pain, bilateral 06/30/2018  . Chronic, continuous use of opioids 06/30/2018  . PVC (premature ventricular contraction) 06/04/2017  . Chest pain 10/19/2016  . Presence of right artificial knee joint 05/18/2016  . Pes anserinus bursitis of left knee 05/18/2016  . Unilateral primary osteoarthritis, right knee 12/13/2015  . Hx of total knee arthroplasty 12/13/2015  . Osteoarthritis of left knee 08/02/2015  . 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 Morbid obesity with BMI of 45.0-49.9, adult ([place]) 07/25/2015  . Spinal stenosis of lumbar region 06/19/2015  . Chronic  pain syndrome 10/30/2014    Past Surgical History:  Procedure Laterality Date  . ABDOMINAL HYSTERECTOMY  1979   left ovary remains  . ABDOMINAL HYSTERECTOMY    . CHOLECYSTECTOMY    . fibrous tissue removed from right shoulder and back of neck      2 years ago   . GANGLION CYST EXCISION    . GASTRIC BYPASS  1980   states had bypass with banding and band left in -  . KNEE SURGERY Bilateral    both knees  2001  . NECK SURGERY N/A 1205/19   ganglion cyst removal, [place]   . PVC ABLATION N/A 06/04/2017   Procedure: PVC ABLATION;  Surgeon: Evans Lance, MD;  Location: [facility];  Service: Cardiovascular;  Laterality: N/A;  . RIGHT/LEFT HEART CATH AND CORONARY ANGIOGRAPHY Bilateral 08/12/2016   Procedure: Right/Left Heart Cath and Coronary Angiography;  Surgeon: Yolonda Kida, MD;  Location: [facility];  Service: Cardiovascular;  Laterality: Bilateral;  . TONSILLECTOMY     age 61  . TOTAL KNEE ARTHROPLASTY Left 08/02/2015   Procedure: LEFT TOTAL KNEE ARTHROPLASTY;  Surgeon: Mcarthur Rossetti, MD;  Location: [facility];  Service: Orthopedics;  Laterality: Left;  . TOTAL KNEE ARTHROPLASTY Right 12/13/2015   Procedure: RIGHT TOTAL KNEE ARTHROPLASTY;  Surgeon: Mcarthur Rossetti, MD;  Location: [facility];  Service: Orthopedics;  Laterality: Right;    OB History   No obstetric history on file.      Home Medications    Prior to Admission medications   Medication Sig Start Date End Date Taking? Authorizing Provider  albuterol (PROVENTIL HFA;VENTOLIN HFA) 108 (90 Base) MCG/ACT inhaler Inhale 2 puffs into the lungs every 6 (six) hours as needed for wheezing or shortness of breath.   Yes [provider]  aspirin EC 81 MG tablet Take 1 tablet (81 mg total) daily by mouth. 11/18/16  Yes Deboraha Sprang, MD  budesonide-formoterol [facility]) 160-4.5 MCG/ACT inhaler Inhale into the lungs. 10/20/17 10/20/18 Yes [provider]  carvedilol (COREG) 6.25 MG  tablet Take 1 tablet (6.25 mg total) by mouth 2 (two) times daily. 09/09/17  Yes Deboraha Sprang, MD  diclofenac sodium (VOLTAREN) 1 % GEL Apply 2 g topically 4 (four) times daily as needed (pain).    Yes [provider]  diphenhydrAMINE (BENADRYL) 25 mg capsule Take 25 mg by mouth every 8 (eight) hours as needed for allergies.    Yes [provider]  docusate sodium (COLACE) 100 MG capsule Take 200 mg by mouth at bedtime.    Yes [provider]  folic acid (FOLVITE) 1 MG tablet Take 1 mg by mouth at bedtime.    Yes [provider]  furosemide (LASIX) 40 MG tablet Take 2 tablets (80 mg) by mouth once daily as directed 09/09/17  Yes Deboraha Sprang, MD  gabapentin (NEURONTIN)  300 MG capsule Take 2 capsules (600 mg total) by mouth 3 (three) times daily. 05/02/18 07/31/18 Yes Molli Barrows, MD  ibuprofen (ADVIL) 800 MG tablet Take 1 tablet (800 mg total) by mouth every 8 (eight) hours as needed for moderate pain. 06/30/18 09/28/18 Yes Molli Barrows, MD  levocetirizine (XYZAL) 5 MG tablet Take 5 mg by mouth at bedtime.    Yes [provider]  montelukast (SINGULAIR) 10 MG tablet Take 10 mg by mouth at bedtime.   Yes [provider]  Multiple Vitamins-Minerals (MULTIVITAMIN GUMMIES ADULTS) CHEW Chew 2 tablets by mouth daily.    Yes [provider]  nortriptyline (PAMELOR) 50 MG capsule Take 50 mg by mouth at bedtime.   Yes [provider]  pantoprazole (PROTONIX) 40 MG tablet Take 40 mg by mouth 2 (two) times daily.   Yes [provider]  PEG 3350-KCl-Na Bicarb-NaCl (NULYTELY PO) Take 4,000 mLs by mouth.   Yes [provider]  potassium chloride SA (K-DUR,KLOR-CON) 20 MEQ tablet Take 20 mEq by mouth See admin instructions. Take 20 meq every third day   Yes [provider]  Probiotic Product (PROBIOTIC COLON SUPPORT) CAPS Take 1 capsule by mouth at bedtime.   Yes [provider]  sucralfate (CARAFATE) 1  G tablet Take 1 g by mouth 2 (two) times daily as needed (esophageal burning).    Yes [provider]  Vitamin D, Ergocalciferol, (DRISDOL) 50000 UNITS CAPS capsule Take 50,000 Units by mouth every 7 (seven) days. thursdays   Yes [provider]  cyclobenzaprine (FLEXERIL) 10 MG tablet Take 1 tablet (10 mg total) by mouth 3 (three) times daily as needed. for muscle spams 09/08/17   Molli Barrows, MD  dexlansoprazole (DEXILANT) 60 MG capsule Take 60 mg by mouth daily.    [provider]  guaiFENesin (MUCINEX) 600 MG 12 hr tablet Take 1,200 mg by mouth 2 (two) times daily.    [provider]  mirabegron ER (MYRBETRIQ) 50 MG TB24 tablet Take 50 mg by mouth daily.    [provider]  nystatin cream (MYCOSTATIN) Apply 1 application topically 2 (two) times daily as needed (irritation).     [provider]  Omega-3 Fatty Acids (FISH OIL) 1000 MG CAPS Take 1 capsule by mouth every 3 (three) days. Take 1000mg  every 3rd day     [provider]  oxyCODONE-acetaminophen (PERCOCET) 7.5-325 MG tablet Take 1 tablet by mouth every 6 (six) hours as needed for up to 30 days for moderate pain or severe pain. 07/20/18 08/19/18  Molli Barrows, MD  oxyCODONE-acetaminophen (PERCOCET) 7.5-325 MG tablet Take 1 tablet by mouth every 6 (six) hours as needed for up to 30 days for moderate pain or severe pain. 08/19/18 09/18/18  Molli Barrows, MD  predniSONE (DELTASONE) 20 MG tablet Take 2 tablets (40 mg total) by mouth daily with breakfast for 3 days. 07/04/18 07/07/18  Zigmund Gottron, NP  sacubitril-valsartan (ENTRESTO) 24-26 MG Take 1 tablet by mouth 2 (two) times daily. 09/09/17   Deboraha Sprang, MD  umeclidinium-vilanterol (ANORO ELLIPTA) 62.5-25 MCG/INH AEPB Inhale 1 puff into the lungs daily. 12/23/17   [provider]    Family History Family History  Problem Relation Age of Onset  . Hypertension Father   . Deep vein thrombosis Father   . Dementia  Mother   . Diabetes Sister   . Congestive Heart Failure Sister   . Congestive Heart Failure Brother   .  Congestive Heart Failure Daughter   . Congestive Heart Failure Son   . Breast cancer Maternal Aunt        great aunt    Social History Social History   Tobacco Use  . Smoking status: Current Some Day Smoker    Packs/day: 0.10    Years: 50.00    Pack years: 5.00    Last attempt to quit: 08/22/2016    Years since quitting: 1.8  . Smokeless tobacco: Former User    Types: Snuff, Sarina Ser    Quit date: 01/13/1995  Substance Use Topics  . Alcohol use: No    Alcohol/week: 0.0 standard drinks  . Drug use: No     Allergies   Hydrocodone-acetaminophen, Iodine, Other, Tape, and Shellfish allergy   Review of Systems Review of Systems   Physical Exam Triage Vital Signs ED Triage Vitals  Enc Vitals Group     BP 07/04/18 1057 106/76     Pulse Rate 07/04/18 1057 93     Resp 07/04/18 1057 20     Temp 07/04/18 1057 98.7 F (37.1 C)     Temp Source 07/04/18 1057 Oral     SpO2 07/04/18 1057 95 %     Weight 07/04/18 1050 288 lb 8 oz (130.9 kg)     Height 07/04/18 1050 5\' 7"  (1.702 m)     Head Circumference --      Peak Flow --      Pain Score --      Pain Loc --      Pain Edu? --      Excl. in [place]? --    No data found.  Updated Vital Signs BP 106/76 (BP Location: Left Arm)   Pulse 93   Temp 98.7 F (37.1 C) (Oral)   Resp 20   Ht 5\' 7"  (1.702 m)   Wt 288 lb 8 oz (130.9 kg)   SpO2 95%   BMI 45.19 kg/m    Physical Exam Constitutional:      General: She is not in acute distress.    Appearance: She is well-developed.  Cardiovascular:     Rate and Rhythm: Normal rate.     Comments: No significant lower extremity edema noted  Pulmonary:     Effort: Pulmonary effort is normal. Tachypnea present.     Breath sounds: Examination of the right-lower field reveals wheezing. Examination of the left-lower field reveals wheezing. Wheezing present.     Comments: occassional  strong congested cough noted  Skin:    General: Skin is warm and dry.  Neurological:     Mental Status: She is alert and oriented to person, place, and time.     EKG:  NSR. Previous EKG was available for review. No stwave changes as interpreted by me, as well as confirmed and reviewed by Dr. Clayborn Bigness with cardiology.   UC Treatments / Results  Labs (all labs ordered are listed, but only abnormal results are displayed) Labs Reviewed  BASIC METABOLIC PANEL - Abnormal; Notable for the following components:      Result Value   Glucose, Bld 115 (*)    All other components within normal limits  CBC WITH DIFFERENTIAL/PLATELET  BRAIN NATRIURETIC PEPTIDE    EKG None  Radiology Dg Chest 2 View  Result Date: 07/04/2018 CLINICAL DATA:  Shortness of breath. EXAM: CHEST - 2 VIEW COMPARISON:  10/29/2016.  CT 08/25/2016. FINDINGS: Mediastinum and hilar structures normal. Cardiomegaly with normal pulmonary vascularity. Lungs are clear. Mild left pleural thickening  again noted consistent scarring. No pleural effusion or pneumothorax IMPRESSION: 1.  Stable cardiomegaly.  No pulmonary venous congestion. 2. Stable mild left pleural thickening consistent scarring. No acute pulmonary abnormality identified. Electronically Signed   By: Marcello Moores  Register   On: 07/04/2018 12:03    Procedures Procedures (including critical care time)  Medications Ordered in UC Medications - No data to display  Initial Impression / Assessment and Plan / UC Course  I have reviewed the triage vital signs and the nursing notes.  Pertinent labs & imaging results that were available during my care of the patient were reviewed by me and considered in my medical decision making (see chart for details).     Vitals reassuring. Chest xray without acute findings. EKG reviewed with cardiology Dr. Clayborn Bigness who agreed no changes and EKG inconsistent with ACS at this time. BNP pending. Recent weight gain and swelling which did  improve with lasix, discussed this with patient as may need to repeat this again to improve breathing. 3 days of prednisone also provided to help with breathing. Encouraged use of inhaler. Strict return precautions provided. Patient verbalized understanding and agreeable to plan.  Ambulatory out of clinic without difficulty.    Final Clinical Impressions(s) / UC Diagnoses   Final diagnoses:  COPD exacerbation (HCC)  SOB (shortness of breath)  Chest pain, unspecified type     Discharge Instructions     Three days of prednisone to help with your wheezing.  Use of your inhaler as needed for shortness of breath .  May use your lasix another 1-2 days at increased dose.  Follow up with covid testing tomorrow as you were directed.  Please go to the ER for any worsening of chest pain , shortness of breath , dizziness or otherwise worsening.     ED Prescriptions    Medication Sig Dispense Auth. Provider   predniSONE (DELTASONE) 20 MG tablet Take 2 tablets (40 mg total) by mouth daily with breakfast for 3 days. 6 tablet Zigmund Gottron, NP     Controlled Substance Prescriptions [place] Controlled Substance Registry consulted? Not Applicable   Zigmund Gottron, NP 07/04/18 1441

## 2018-07-04 NOTE — Discharge Instructions (Signed)
Three days of prednisone to help with your wheezing.  Use of your inhaler as needed for shortness of breath .  May use your lasix another 1-2 days at increased dose.  Follow up with covid testing tomorrow as you were directed.  Please go to the ER for any worsening of chest pain , shortness of breath , dizziness or otherwise worsening.

## 2018-07-04 NOTE — ED Triage Notes (Signed)
Patient states she has had a recent worsening of shortness of breath and chest tightness.  Patient also states her BP has been fluctuating a good bit.

## 2018-07-05 ENCOUNTER — Other Ambulatory Visit: Payer: Self-pay

## 2018-07-05 ENCOUNTER — Other Ambulatory Visit
Admission: RE | Admit: 2018-07-05 | Discharge: 2018-07-05 | Disposition: A | Discharge status: Home / Self Care | Payer: Medicare Other | Source: Ambulatory Visit | Attending: Gastroenterology | Admitting: Gastroenterology

## 2018-07-05 DIAGNOSIS — Z1159 Encounter for screening for other viral diseases: Secondary | ICD-10-CM | POA: Insufficient documentation

## 2018-07-06 LAB — NOVEL CORONAVIRUS, NAA (HOSP ORDER, SEND-OUT TO REF LAB; TAT 18-24 HRS): SARS-CoV-2, NAA: NOT DETECTED

## 2018-07-07 ENCOUNTER — Telehealth (HOSPITAL_COMMUNITY): Payer: Self-pay | Admitting: Emergency Medicine

## 2018-07-07 ENCOUNTER — Encounter: Payer: Self-pay | Admitting: *Deleted

## 2018-07-07 NOTE — Telephone Encounter (Signed)
Patient contacted and made aware of all results, all questions answered.   

## 2018-07-08 ENCOUNTER — Encounter: Admission: RE | Payer: Self-pay | Source: Home / Self Care

## 2018-07-08 ENCOUNTER — Ambulatory Visit: Admission: RE | Admit: 2018-07-08 | Payer: Medicare Other | Source: Home / Self Care | Admitting: Gastroenterology

## 2018-07-08 HISTORY — DX: Chronic obstructive pulmonary disease, unspecified: J44.9

## 2018-07-08 HISTORY — DX: Unspecified hemorrhoids: K64.9

## 2018-07-08 HISTORY — DX: Headache, unspecified: R51.9

## 2018-07-08 SURGERY — COLONOSCOPY WITH PROPOFOL
Anesthesia: General

## 2018-07-11 ENCOUNTER — Other Ambulatory Visit
Admission: RE | Admit: 2018-07-11 | Discharge: 2018-07-11 | Disposition: A | Discharge status: Home / Self Care | Payer: Medicare Other | Source: Ambulatory Visit | Attending: Internal Medicine | Admitting: Internal Medicine

## 2018-07-11 ENCOUNTER — Encounter: Payer: Self-pay | Admitting: Internal Medicine

## 2018-07-11 DIAGNOSIS — R079 Chest pain, unspecified: Secondary | ICD-10-CM | POA: Diagnosis present

## 2018-07-11 LAB — FIBRIN DERIVATIVES D-DIMER (ARMC ONLY): Fibrin derivatives D-dimer (ARMC): >7500 ng/mL (FEU) — ABNORMAL HIGH (ref 0.00–499.00)

## 2018-07-12 ENCOUNTER — Other Ambulatory Visit: Payer: Self-pay | Admitting: Internal Medicine

## 2018-07-12 DIAGNOSIS — I2699 Other pulmonary embolism without acute cor pulmonale: Secondary | ICD-10-CM

## 2018-07-12 DIAGNOSIS — R079 Chest pain, unspecified: Secondary | ICD-10-CM

## 2018-07-13 ENCOUNTER — Inpatient Hospital Stay
Admission: EM | Admit: 2018-07-13 | Discharge: 2018-07-16 | DRG: 163 | Disposition: A | Discharge status: Home / Self Care | Payer: Medicare Other | Attending: Internal Medicine | Admitting: Internal Medicine

## 2018-07-13 ENCOUNTER — Other Ambulatory Visit: Payer: Self-pay

## 2018-07-13 ENCOUNTER — Emergency Department: Payer: Medicare Other

## 2018-07-13 ENCOUNTER — Encounter: Payer: Self-pay | Admitting: Emergency Medicine

## 2018-07-13 DIAGNOSIS — F17210 Nicotine dependence, cigarettes, uncomplicated: Secondary | ICD-10-CM | POA: Diagnosis present

## 2018-07-13 DIAGNOSIS — G629 Polyneuropathy, unspecified: Secondary | ICD-10-CM | POA: Diagnosis present

## 2018-07-13 DIAGNOSIS — R0602 Shortness of breath: Secondary | ICD-10-CM | POA: Diagnosis present

## 2018-07-13 DIAGNOSIS — Z90710 Acquired absence of both cervix and uterus: Secondary | ICD-10-CM

## 2018-07-13 DIAGNOSIS — Z833 Family history of diabetes mellitus: Secondary | ICD-10-CM

## 2018-07-13 DIAGNOSIS — Z885 Allergy status to narcotic agent status: Secondary | ICD-10-CM

## 2018-07-13 DIAGNOSIS — I248 Other forms of acute ischemic heart disease: Secondary | ICD-10-CM | POA: Diagnosis present

## 2018-07-13 DIAGNOSIS — I82431 Acute embolism and thrombosis of right popliteal vein: Secondary | ICD-10-CM | POA: Diagnosis present

## 2018-07-13 DIAGNOSIS — Z9884 Bariatric surgery status: Secondary | ICD-10-CM

## 2018-07-13 DIAGNOSIS — Z6841 Body Mass Index (BMI) 40.0 and over, adult: Secondary | ICD-10-CM | POA: Diagnosis not present

## 2018-07-13 DIAGNOSIS — Z79899 Other long term (current) drug therapy: Secondary | ICD-10-CM

## 2018-07-13 DIAGNOSIS — I428 Other cardiomyopathies: Secondary | ICD-10-CM | POA: Diagnosis present

## 2018-07-13 DIAGNOSIS — Z9049 Acquired absence of other specified parts of digestive tract: Secondary | ICD-10-CM

## 2018-07-13 DIAGNOSIS — Z20828 Contact with and (suspected) exposure to other viral communicable diseases: Secondary | ICD-10-CM | POA: Diagnosis present

## 2018-07-13 DIAGNOSIS — R32 Unspecified urinary incontinence: Secondary | ICD-10-CM | POA: Diagnosis present

## 2018-07-13 DIAGNOSIS — Z8249 Family history of ischemic heart disease and other diseases of the circulatory system: Secondary | ICD-10-CM

## 2018-07-13 DIAGNOSIS — Z7951 Long term (current) use of inhaled steroids: Secondary | ICD-10-CM

## 2018-07-13 DIAGNOSIS — Z96653 Presence of artificial knee joint, bilateral: Secondary | ICD-10-CM | POA: Diagnosis present

## 2018-07-13 DIAGNOSIS — K219 Gastro-esophageal reflux disease without esophagitis: Secondary | ICD-10-CM | POA: Diagnosis present

## 2018-07-13 DIAGNOSIS — M797 Fibromyalgia: Secondary | ICD-10-CM | POA: Diagnosis present

## 2018-07-13 DIAGNOSIS — I2609 Other pulmonary embolism with acute cor pulmonale: Secondary | ICD-10-CM | POA: Diagnosis present

## 2018-07-13 DIAGNOSIS — I10 Essential (primary) hypertension: Secondary | ICD-10-CM | POA: Diagnosis present

## 2018-07-13 DIAGNOSIS — Z91048 Other nonmedicinal substance allergy status: Secondary | ICD-10-CM

## 2018-07-13 DIAGNOSIS — I2699 Other pulmonary embolism without acute cor pulmonale: Secondary | ICD-10-CM

## 2018-07-13 DIAGNOSIS — Z7982 Long term (current) use of aspirin: Secondary | ICD-10-CM

## 2018-07-13 DIAGNOSIS — J9601 Acute respiratory failure with hypoxia: Secondary | ICD-10-CM | POA: Diagnosis present

## 2018-07-13 DIAGNOSIS — J449 Chronic obstructive pulmonary disease, unspecified: Secondary | ICD-10-CM | POA: Diagnosis present

## 2018-07-13 DIAGNOSIS — Z79891 Long term (current) use of opiate analgesic: Secondary | ICD-10-CM

## 2018-07-13 DIAGNOSIS — Z888 Allergy status to other drugs, medicaments and biological substances status: Secondary | ICD-10-CM | POA: Diagnosis not present

## 2018-07-13 DIAGNOSIS — E669 Obesity, unspecified: Secondary | ICD-10-CM | POA: Diagnosis present

## 2018-07-13 DIAGNOSIS — Z791 Long term (current) use of non-steroidal anti-inflammatories (NSAID): Secondary | ICD-10-CM

## 2018-07-13 DIAGNOSIS — I82409 Acute embolism and thrombosis of unspecified deep veins of unspecified lower extremity: Secondary | ICD-10-CM

## 2018-07-13 DIAGNOSIS — Z91013 Allergy to seafood: Secondary | ICD-10-CM | POA: Diagnosis not present

## 2018-07-13 DIAGNOSIS — Z803 Family history of malignant neoplasm of breast: Secondary | ICD-10-CM

## 2018-07-13 HISTORY — DX: Acute embolism and thrombosis of unspecified deep veins of unspecified lower extremity: I82.409

## 2018-07-13 HISTORY — DX: Other pulmonary embolism without acute cor pulmonale: I26.99

## 2018-07-13 LAB — COMPREHENSIVE METABOLIC PANEL
ALT: 13 U/L (ref 0–44)
AST: 16 U/L (ref 15–41)
Albumin: 3.1 g/dL — ABNORMAL LOW (ref 3.5–5.0)
Alkaline Phosphatase: 70 U/L (ref 38–126)
Anion gap: 12 (ref 5–15)
BUN: 15 mg/dL (ref 8–23)
CO2: 22 mmol/L (ref 22–32)
Calcium: 8.8 mg/dL — ABNORMAL LOW (ref 8.9–10.3)
Chloride: 110 mmol/L (ref 98–111)
Creatinine, Ser: 0.72 mg/dL (ref 0.44–1.00)
GFR calc Af Amer: >60 mL/min (ref >60)
GFR calc non Af Amer: >60 mL/min (ref >60)
Glucose, Bld: 109 mg/dL — ABNORMAL HIGH (ref 70–99)
Potassium: 3.4 mmol/L — ABNORMAL LOW (ref 3.5–5.1)
Sodium: 144 mmol/L (ref 135–145)
Total Bilirubin: 0.7 mg/dL (ref 0.3–1.2)
Total Protein: 6.6 g/dL (ref 6.5–8.1)

## 2018-07-13 LAB — CBC WITH DIFFERENTIAL/PLATELET
Abs Immature Granulocytes: 0.01 10*3/uL (ref 0.00–0.07)
Basophils Absolute: 0 10*3/uL (ref 0.0–0.1)
Basophils Relative: 1 %
Eosinophils Absolute: 0.1 10*3/uL (ref 0.0–0.5)
Eosinophils Relative: 1 %
HCT: 40.4 % (ref 36.0–46.0)
Hemoglobin: 13.2 g/dL (ref 12.0–15.0)
Immature Granulocytes: 0 %
Lymphocytes Relative: 38 %
Lymphs Abs: 1.8 10*3/uL (ref 0.7–4.0)
MCH: 29 pg (ref 26.0–34.0)
MCHC: 32.7 g/dL (ref 30.0–36.0)
MCV: 88.8 fL (ref 80.0–100.0)
Monocytes Absolute: 0.5 10*3/uL (ref 0.1–1.0)
Monocytes Relative: 11 %
Neutro Abs: 2.3 10*3/uL (ref 1.7–7.7)
Neutrophils Relative %: 49 %
Platelets: 247 10*3/uL (ref 150–400)
RBC: 4.55 MIL/uL (ref 3.87–5.11)
RDW: 13.8 % (ref 11.5–15.5)
WBC: 4.7 10*3/uL (ref 4.0–10.5)
nRBC: 0 % (ref 0.0–0.2)

## 2018-07-13 LAB — PROTIME-INR
INR: 1 (ref 0.8–1.2)
Prothrombin Time: 12.7 seconds (ref 11.4–15.2)

## 2018-07-13 LAB — SARS CORONAVIRUS 2 BY RT PCR (HOSPITAL ORDER, PERFORMED IN ~~LOC~~ HOSPITAL LAB): SARS Coronavirus 2: NEGATIVE

## 2018-07-13 LAB — BRAIN NATRIURETIC PEPTIDE: B Natriuretic Peptide: 564 pg/mL — ABNORMAL HIGH (ref 0.0–100.0)

## 2018-07-13 LAB — TROPONIN I (HIGH SENSITIVITY)
Troponin I (High Sensitivity): 113 ng/L (ref <18)
Troponin I (High Sensitivity): 147 ng/L (ref <18)

## 2018-07-13 LAB — APTT: aPTT: <24 seconds — ABNORMAL LOW (ref 24–36)

## 2018-07-13 MED ORDER — ALBUTEROL SULFATE (2.5 MG/3ML) 0.083% IN NEBU: 2.5000 mg | INHALATION_SOLUTION | Freq: Four times a day (QID) | RESPIRATORY_TRACT | Status: DC | PRN

## 2018-07-13 MED ORDER — ACETAMINOPHEN 325 MG PO TABS: 650.0000 mg | ORAL_TABLET | Freq: Four times a day (QID) | ORAL | Status: DC | PRN

## 2018-07-13 MED ORDER — DIPHENHYDRAMINE HCL 50 MG/ML IJ SOLN
50.0000 mg | Freq: Once | INTRAMUSCULAR | Status: AC
  Administered 2018-07-13: 50 mg via INTRAVENOUS
  Filled 2018-07-13: qty 1

## 2018-07-13 MED ORDER — OXYCODONE-ACETAMINOPHEN 5-325 MG PO TABS
1.0000 | ORAL_TABLET | Freq: Four times a day (QID) | ORAL | Status: DC | PRN
  Administered 2018-07-13 – 2018-07-16 (×5): 1 via ORAL
  Filled 2018-07-13 (×5): qty 1

## 2018-07-13 MED ORDER — CETIRIZINE HCL 10 MG PO TABS
10.0000 mg | ORAL_TABLET | Freq: Every day | ORAL | Status: DC
  Administered 2018-07-13 – 2018-07-16 (×4): 10 mg via ORAL
  Filled 2018-07-13 (×4): qty 1

## 2018-07-13 MED ORDER — VITAMIN D (ERGOCALCIFEROL) 1.25 MG (50000 UNIT) PO CAPS
50000.0000 [IU] | ORAL_CAPSULE | ORAL | Status: DC
  Administered 2018-07-14: 20:00:00 50000 [IU] via ORAL
  Filled 2018-07-13: qty 1

## 2018-07-13 MED ORDER — ASPIRIN EC 81 MG PO TBEC
81.0000 mg | DELAYED_RELEASE_TABLET | Freq: Every day | ORAL | Status: DC
  Administered 2018-07-13 – 2018-07-16 (×4): 81 mg via ORAL
  Filled 2018-07-13 (×4): qty 1

## 2018-07-13 MED ORDER — SACUBITRIL-VALSARTAN 24-26 MG PO TABS
1.0000 | ORAL_TABLET | Freq: Two times a day (BID) | ORAL | Status: DC
  Administered 2018-07-13 – 2018-07-16 (×6): 1 via ORAL
  Filled 2018-07-13 (×7): qty 1

## 2018-07-13 MED ORDER — ACETAMINOPHEN 650 MG RE SUPP: 650.0000 mg | Freq: Four times a day (QID) | RECTAL | Status: DC | PRN

## 2018-07-13 MED ORDER — METHYLPREDNISOLONE SODIUM SUCC 125 MG IJ SOLR
125.0000 mg | Freq: Once | INTRAMUSCULAR | Status: AC
  Administered 2018-07-13: 13:00:00 125 mg via INTRAVENOUS
  Filled 2018-07-13: qty 2

## 2018-07-13 MED ORDER — HEPARIN (PORCINE) 25000 UT/250ML-% IV SOLN
1050.0000 [IU]/h | INTRAVENOUS | Status: DC
  Administered 2018-07-13: 19:00:00 1400 [IU]/h via INTRAVENOUS
  Administered 2018-07-14: 18:00:00 1200 [IU]/h via INTRAVENOUS
  Filled 2018-07-13 (×2): qty 250

## 2018-07-13 MED ORDER — FOLIC ACID 1 MG PO TABS
1.0000 mg | ORAL_TABLET | Freq: Every day | ORAL | Status: DC
  Administered 2018-07-13 – 2018-07-15 (×3): 1 mg via ORAL
  Filled 2018-07-13 (×3): qty 1

## 2018-07-13 MED ORDER — CARVEDILOL 6.25 MG PO TABS
6.2500 mg | ORAL_TABLET | Freq: Two times a day (BID) | ORAL | Status: DC
  Administered 2018-07-13 – 2018-07-16 (×5): 6.25 mg via ORAL
  Filled 2018-07-13 (×6): qty 1

## 2018-07-13 MED ORDER — IOHEXOL 350 MG/ML SOLN
75.0000 mL | Freq: Once | INTRAVENOUS | Status: AC | PRN
  Administered 2018-07-13: 17:00:00 75 mL via INTRAVENOUS

## 2018-07-13 MED ORDER — DOCUSATE SODIUM 100 MG PO CAPS
200.0000 mg | ORAL_CAPSULE | Freq: Every day | ORAL | Status: DC
  Administered 2018-07-13 – 2018-07-15 (×2): 200 mg via ORAL
  Filled 2018-07-13 (×3): qty 2

## 2018-07-13 MED ORDER — DIPHENHYDRAMINE HCL 25 MG PO CAPS: 25.0000 mg | ORAL_CAPSULE | Freq: Three times a day (TID) | ORAL | Status: DC | PRN

## 2018-07-13 MED ORDER — MONTELUKAST SODIUM 10 MG PO TABS
10.0000 mg | ORAL_TABLET | Freq: Every day | ORAL | Status: DC
  Administered 2018-07-13 – 2018-07-15 (×3): 10 mg via ORAL
  Filled 2018-07-13 (×3): qty 1

## 2018-07-13 MED ORDER — AZELASTINE HCL 0.1 % NA SOLN
2.0000 | Freq: Two times a day (BID) | NASAL | Status: DC | PRN
  Filled 2018-07-13: qty 30

## 2018-07-13 MED ORDER — SUCRALFATE 1 G PO TABS: 1.0000 g | ORAL_TABLET | Freq: Two times a day (BID) | ORAL | Status: DC | PRN

## 2018-07-13 MED ORDER — FUROSEMIDE 40 MG PO TABS
80.0000 mg | ORAL_TABLET | Freq: Every day | ORAL | Status: DC
  Administered 2018-07-13 – 2018-07-16 (×4): 80 mg via ORAL
  Filled 2018-07-13 (×4): qty 2

## 2018-07-13 MED ORDER — ADULT MULTIVITAMIN W/MINERALS CH
1.0000 | ORAL_TABLET | Freq: Every day | ORAL | Status: DC
  Administered 2018-07-13 – 2018-07-16 (×4): 1 via ORAL
  Filled 2018-07-13 (×4): qty 1

## 2018-07-13 MED ORDER — ONDANSETRON HCL 4 MG PO TABS: 4.0000 mg | ORAL_TABLET | Freq: Four times a day (QID) | ORAL | Status: DC | PRN

## 2018-07-13 MED ORDER — UMECLIDINIUM-VILANTEROL 62.5-25 MCG/INH IN AEPB
1.0000 | INHALATION_SPRAY | Freq: Every day | RESPIRATORY_TRACT | Status: DC
  Administered 2018-07-14 – 2018-07-16 (×3): 1 via RESPIRATORY_TRACT
  Filled 2018-07-13: qty 14

## 2018-07-13 MED ORDER — LEVOCETIRIZINE DIHYDROCHLORIDE 5 MG PO TABS: 5.0000 mg | ORAL_TABLET | Freq: Every day | ORAL | Status: DC

## 2018-07-13 MED ORDER — PANTOPRAZOLE SODIUM 40 MG PO TBEC
40.0000 mg | DELAYED_RELEASE_TABLET | Freq: Two times a day (BID) | ORAL | Status: DC
  Administered 2018-07-13 – 2018-07-16 (×6): 40 mg via ORAL
  Filled 2018-07-13 (×7): qty 1

## 2018-07-13 MED ORDER — MIRABEGRON ER 50 MG PO TB24
50.0000 mg | ORAL_TABLET | Freq: Every day | ORAL | Status: DC
  Administered 2018-07-13 – 2018-07-16 (×4): 50 mg via ORAL
  Filled 2018-07-13 (×4): qty 1

## 2018-07-13 MED ORDER — GABAPENTIN 300 MG PO CAPS
600.0000 mg | ORAL_CAPSULE | Freq: Three times a day (TID) | ORAL | Status: DC
  Administered 2018-07-13 – 2018-07-16 (×7): 600 mg via ORAL
  Filled 2018-07-13 (×8): qty 2

## 2018-07-13 MED ORDER — HEPARIN SODIUM (PORCINE) 5000 UNIT/ML IJ SOLN: 4000.0000 [IU] | Freq: Once | INTRAMUSCULAR | Status: DC

## 2018-07-13 MED ORDER — NORTRIPTYLINE HCL 25 MG PO CAPS
50.0000 mg | ORAL_CAPSULE | Freq: Every day | ORAL | Status: DC
  Administered 2018-07-13 – 2018-07-15 (×3): 50 mg via ORAL
  Filled 2018-07-13 (×4): qty 2

## 2018-07-13 MED ORDER — POTASSIUM CHLORIDE CRYS ER 20 MEQ PO TBCR
20.0000 meq | EXTENDED_RELEASE_TABLET | ORAL | Status: DC
  Administered 2018-07-13: 20 meq via ORAL
  Filled 2018-07-13 (×2): qty 1

## 2018-07-13 MED ORDER — HEPARIN BOLUS VIA INFUSION
5000.0000 [IU] | Freq: Once | INTRAVENOUS | Status: AC
  Administered 2018-07-13: 5000 [IU] via INTRAVENOUS
  Filled 2018-07-13: qty 5000

## 2018-07-13 MED ORDER — ONDANSETRON HCL 4 MG/2ML IJ SOLN: 4.0000 mg | Freq: Four times a day (QID) | INTRAMUSCULAR | Status: DC | PRN

## 2018-07-13 MED ORDER — IPRATROPIUM-ALBUTEROL 0.5-2.5 (3) MG/3ML IN SOLN
3.0000 mL | Freq: Once | RESPIRATORY_TRACT | Status: AC
  Administered 2018-07-13: 3 mL via RESPIRATORY_TRACT
  Filled 2018-07-13: qty 3

## 2018-07-13 MED ORDER — VITAMIN C 500 MG PO TABS
500.0000 mg | ORAL_TABLET | Freq: Every day | ORAL | Status: DC
  Administered 2018-07-13 – 2018-07-16 (×4): 500 mg via ORAL
  Filled 2018-07-13 (×4): qty 1

## 2018-07-13 MED ORDER — CYCLOBENZAPRINE HCL 10 MG PO TABS: 10.0000 mg | ORAL_TABLET | Freq: Three times a day (TID) | ORAL | Status: DC | PRN

## 2018-07-13 MED ORDER — RISAQUAD PO CAPS
1.0000 | ORAL_CAPSULE | Freq: Every day | ORAL | Status: DC
  Administered 2018-07-13 – 2018-07-15 (×3): 1 via ORAL
  Filled 2018-07-13 (×3): qty 1

## 2018-07-13 MED ORDER — GUAIFENESIN ER 600 MG PO TB12
1200.0000 mg | ORAL_TABLET | Freq: Two times a day (BID) | ORAL | Status: DC
  Administered 2018-07-13 – 2018-07-16 (×6): 1200 mg via ORAL
  Filled 2018-07-13 (×6): qty 2

## 2018-07-13 NOTE — ED Notes (Addendum)
ED TO INPATIENT HANDOFF REPORT  ED Nurse Name and Phone #:  Gershon Mussel RN   [phone]  S Name/Age/Gender Wendy Sanchez 68 y.o. female Room/Bed: ED13A/ED13A  Code Status   Code Status: Prior  Home/SNF/Other Home Patient oriented to: self, place, time and situation Is this baseline? Yes   Triage Complete: Triage complete  Chief Complaint breathing difficulty  Triage Note Pt to ER via EMS from home with c/o increasing SHOB over last week.  Pt was seen at PCP and tested for CoVid which was neg.  Pt was suppose to go for "blood clot" testing.  Pt arrives with notable increased work of breathing.  Pt with hx of CHF and COPD.  Pt arrives on NRB mask.   Allergies Allergies  Allergen Reactions  . Hydrocodone-Acetaminophen Swelling  . Iodine Anaphylaxis and Swelling  . Other Other (See Comments)    ALLERGY TO METAL - BLACKENS SKIN AND CAUSES A RASH   . Tape Swelling and Other (See Comments)    Skin comes off.  Paper tape is ok  . Shellfish Allergy Nausea And Vomiting and Other (See Comments)    Episode of GI infection after eating clam chowder. Still eats shrimp and other seafood    Level of Care/Admitting Diagnosis ED Disposition    ED Disposition Condition [place]: [place] [[id number]]  Level of Care: Med-Surg [16]  Covid Evaluation: Confirmed COVID Negative  Diagnosis: Pulmonary embolism [facility]) [[id number]]  Admitting Physician: Henreitta Leber [[id number]]  Attending Physician: Henreitta Leber [[id number]]  Estimated length of stay: past midnight tomorrow  Certification:: I certify this patient will need inpatient services for at least 2 midnights  PT Class (Do Not Modify): Inpatient [101]  PT Acc Code (Do Not Modify): Private [1]       B Medical/Surgery History Past Medical History:  Diagnosis Date  . Allergic rhinitis   . Arthritis   . Asthma    problems with fumes and aerosols cause asthma  . Chest pain 10/19/2016  . Complication of  anesthesia    awakens during surgery; has occurred with last 3-4 surgeries   . COPD (chronic obstructive pulmonary disease) ([place])   . Environmental allergies    fumes   . Fibromyalgia   . GERD (gastroesophageal reflux disease)   . Headache   . Hemorrhoids   . History of bronchitis   . Hx of total knee arthroplasty 12/13/2015  . Hypertension   . Morbid obesity with BMI of 45.0-49.9, adult ([place]) 07/25/2015  . NICM (nonischemic cardiomyopathy) ([place])    a. ? PVC mediated;  b. 06/2016 Echo: EF 25-30%, mild LVH, mild LAE, mild AI/MR/TR/PR; c. 08/2016 Cath: nl cors, EF 25%.  . Numbness    hands bilat when driving; improves when not preforming task   . Osteoarthritis of left knee 08/02/2015  . Pes anserinus bursitis of left knee 05/18/2016  . Presence of right artificial knee joint 05/18/2016  . PVC (premature ventricular contraction) 06/04/2017  . PVC's (premature ventricular contractions)    a. 11/2016 Amio started;  b. 11/29/16 48h Holter: 31540 PVC's (41%); c. 12/2016 24h Holter: 18040 PVC's (15%); c. 02/2017 48h Holter: 37262 PVC's (41%).  Marland Kitchen Spinal stenosis of lumbar region 06/19/2015  . Status post gastric banding   . 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 Tinnitus    comes and goes   . Unilateral primary osteoarthritis, right knee 12/13/2015  . Vertigo    6  months ago approx    Past Surgical History:  Procedure Laterality Date  . ABDOMINAL HYSTERECTOMY  1979   left ovary remains  . ABDOMINAL HYSTERECTOMY    . CHOLECYSTECTOMY    . COLONOSCOPY    . fibrous tissue removed from right shoulder and back of neck      2 years ago   . GANGLION CYST EXCISION    . GASTRIC BYPASS  1980   states had bypass with banding and band left in -  . GASTRIC BYPASS OPEN    . JOINT REPLACEMENT    . KNEE SURGERY Bilateral    both knees  2001  . NECK SURGERY N/A 1205/19   ganglion cyst removal, [place]   . PVC ABLATION N/A 06/04/2017   Procedure: PVC ABLATION;  Surgeon: Evans Lance, MD;   Location: [place] [facility];  Service: Cardiovascular;  Laterality: N/A;  . RIGHT/LEFT HEART CATH AND CORONARY ANGIOGRAPHY Bilateral 08/12/2016   Procedure: Right/Left Heart Cath and Coronary Angiography;  Surgeon: Yolonda Kida, MD;  Location: [place] [facility];  Service: Cardiovascular;  Laterality: Bilateral;  . TONSILLECTOMY     age 58  . TOTAL KNEE ARTHROPLASTY Left 08/02/2015   Procedure: LEFT TOTAL KNEE ARTHROPLASTY;  Surgeon: Mcarthur Rossetti, MD;  Location: [facility];  Service: Orthopedics;  Laterality: Left;  . TOTAL KNEE ARTHROPLASTY Right 12/13/2015   Procedure: RIGHT TOTAL KNEE ARTHROPLASTY;  Surgeon: Mcarthur Rossetti, MD;  Location: [facility];  Service: Orthopedics;  Laterality: Right;     A IV Location/Drains/Wounds Patient Lines/Drains/Airways Status   Active Line/Drains/Airways    Name:   Placement date:   Placement time:   Site:   Days:   Peripheral IV 07/13/18 Left Antecubital   07/13/18    1250    Antecubital   less than 1   Incision (Closed) 12/13/15 Knee Right   12/13/15    0853     943          Intake/Output Last 24 hours No intake or output data in the 24 hours ending 07/13/18 2007  Labs/Imaging Results for orders placed or performed during the hospital encounter of 07/13/18 (from the past 48 hour(s))  CBC with Differential     Status: None   Collection Time: 07/13/18  1:37 PM  Result Value Ref Range   WBC 4.7 4.0 - 10.5 K/uL   RBC 4.55 3.87 - 5.11 MIL/uL   Hemoglobin 13.2 12.0 - 15.0 g/dL   HCT 40.4 36.0 - 46.0 %   MCV 88.8 80.0 - 100.0 fL   MCH 29.0 26.0 - 34.0 pg   MCHC 32.7 30.0 - 36.0 g/dL   RDW 13.8 11.5 - 15.5 %   Platelets 247 150 - 400 K/uL   nRBC 0.0 0.0 - 0.2 %   Neutrophils Relative % 49 %   Neutro Abs 2.3 1.7 - 7.7 K/uL   Lymphocytes Relative 38 %   Lymphs Abs 1.8 0.7 - 4.0 K/uL   Monocytes Relative 11 %   Monocytes Absolute 0.5 0.1 - 1.0 K/uL   Eosinophils Relative 1 %   Eosinophils Absolute 0.1 0.0 - 0.5 K/uL    Basophils Relative 1 %   Basophils Absolute 0.0 0.0 - 0.1 K/uL   Immature Granulocytes 0 %   Abs Immature Granulocytes 0.01 0.00 - 0.07 K/uL    Comment: Performed at [facility], [address], [place], [place] [address]  Comprehensive metabolic panel     Status: Abnormal  Collection Time: 07/13/18  1:37 PM  Result Value Ref Range   Sodium 144 135 - 145 mmol/L   Potassium 3.4 (L) 3.5 - 5.1 mmol/L   Chloride 110 98 - 111 mmol/L   CO2 22 22 - 32 mmol/L   Glucose, Bld 109 (H) 70 - 99 mg/dL   BUN 15 8 - 23 mg/dL   Creatinine, Ser 0.72 0.44 - 1.00 mg/dL   Calcium 8.8 (L) 8.9 - 10.3 mg/dL   Total Protein 6.6 6.5 - 8.1 g/dL   Albumin 3.1 (L) 3.5 - 5.0 g/dL   AST 16 15 - 41 U/L   ALT 13 0 - 44 U/L   Alkaline Phosphatase 70 38 - 126 U/L   Total Bilirubin 0.7 0.3 - 1.2 mg/dL   GFR calc non Af Amer >60 >60 mL/min   GFR calc Af Amer >60 >60 mL/min   Anion gap 12 5 - 15    Comment: Performed at [facility], [address] [place], [place] [address]  Troponin I (High Sensitivity)     Status: Abnormal   Collection Time: 07/13/18  1:37 PM  Result Value Ref Range   Troponin I (High Sensitivity) 113 (HH) <18 ng/L    Comment: CRITICAL RESULT CALLED TO, READ BACK BY AND VERIFIED WITH JESSICA WHITLEY RN AT 4128 07/13/2018 SNG (NOTE) Elevated high sensitivity troponin I (hsTnI) values and significant  changes across serial measurements may suggest ACS but many other  chronic and acute conditions are known to elevate hsTnI results.  Refer to the "Links" section for chest pain algorithms and additional  guidance. Performed at [facility], [address] [place], [place] [address]   Brain natriuretic peptide     Status: Abnormal   Collection Time: 07/13/18  1:37 PM  Result Value Ref Range   B Natriuretic Peptide 564.0 (H) 0.0 - 100.0 pg/mL    Comment: Performed at [facility], [address], [place], [place] [address]  SARS Coronavirus 2 (CEPHEID-  Performed in [place] hospital lab), Hosp Order     Status: None   Collection Time: 07/13/18  1:37 PM   Specimen: Nasopharyngeal Swab  Result Value Ref Range   SARS Coronavirus 2 NEGATIVE NEGATIVE    Comment: (NOTE) If result is NEGATIVE SARS-CoV-2 target nucleic acids are NOT DETECTED. The SARS-CoV-2 RNA is generally detectable in upper and lower  respiratory specimens during the acute phase of infection. The lowest  concentration of SARS-CoV-2 viral copies this assay can detect is 250  copies / mL. A negative result does not preclude SARS-CoV-2 infection  and should not be used as the sole basis for treatment or other  patient management decisions.  A negative result may occur with  improper specimen collection / handling, submission of specimen other  than nasopharyngeal swab, presence of viral mutation(s) within the  areas targeted by this assay, and inadequate number of viral copies  (<250 copies / mL). A negative result must be combined with clinical  observations, patient history, and epidemiological information. If result is POSITIVE SARS-CoV-2 target nucleic acids are DETECTED. The SARS-CoV-2 RNA is generally detectable in upper and lower  respiratory specimens dur ing the acute phase of infection.  Positive  results are indicative of active infection with SARS-CoV-2.  Clinical  correlation with patient history and other diagnostic information is  necessary to determine patient infection status.  Positive results do  not rule out bacterial infection or co-infection with other viruses. If result is  PRESUMPTIVE POSTIVE SARS-CoV-2 nucleic acids MAY BE PRESENT.   A presumptive positive result was obtained on the submitted specimen  and confirmed on repeat testing.  While 2019 novel coronavirus  (SARS-CoV-2) nucleic acids may be present in the submitted sample  additional confirmatory testing may be necessary for epidemiological  and / or clinical management purposes  to  differentiate between  SARS-CoV-2 and other Sarbecovirus currently known to infect humans.  If clinically indicated additional testing with an alternate test  methodology [phone]) is advised. The SARS-CoV-2 RNA is generally  detectable in upper and lower respiratory sp ecimens during the acute  phase of infection. The expected result is Negative. Fact Sheet for Patients:  StrictlyIdeas.no Fact Sheet for Healthcare Providers: BankingDealers.co.za This test is not yet approved or cleared by the [place] FDA and has been authorized for detection and/or diagnosis of SARS-CoV-2 by FDA under an Emergency Use Authorization (EUA).  This EUA will remain in effect (meaning this test can be used) for the duration of the COVID-19 declaration under Section 564(b)(1) of the Act, 21 U.S.C. section 360bbb-3(b)(1), unless the authorization is terminated or revoked sooner. Performed at [facility], [place], [place] [address]   Troponin I (High Sensitivity)     Status: Abnormal   Collection Time: 07/13/18  3:31 PM  Result Value Ref Range   Troponin I (High Sensitivity) 147 (HH) <18 ng/L    Comment: CRITICAL VALUE NOTED. VALUE IS CONSISTENT WITH PREVIOUSLY REPORTED/CALLED VALUE KLW (NOTE) Elevated high sensitivity troponin I (hsTnI) values and significant  changes across serial measurements may suggest ACS but many other  chronic and acute conditions are known to elevate hsTnI results.  Refer to the "Links" section for chest pain algorithms and additional  guidance. Performed at [facility], [address] [place], [place] [address]   Protime-INR     Status: None   Collection Time: 07/13/18  6:50 PM  Result Value Ref Range   Prothrombin Time 12.7 11.4 - 15.2 seconds   INR 1.0 0.8 - 1.2    Comment: (NOTE) INR goal varies based on device and disease states. Performed at [facility], [address] [place], [place] [address]   APTT     Status: Abnormal   Collection Time: 07/13/18  6:50 PM  Result Value Ref Range   aPTT <24 (L) 24 - 36 seconds    Comment: Performed at [facility], [address] [place], [place] [address]   Ct Angio Chest Pe W And/or Wo Contrast  Result Date: 07/13/2018 CLINICAL DATA:  68 year old female with history of increasing shortness of breath for the past week. Increased work of breathing. EXAM: CT ANGIOGRAPHY CHEST WITH CONTRAST TECHNIQUE: Multidetector CT imaging of the chest was performed using the standard protocol during bolus administration of intravenous contrast. Multiplanar CT image reconstructions and MIPs were obtained to evaluate the vascular anatomy. CONTRAST:  9mL OMNIPAQUE IOHEXOL 350 MG/ML SOLN COMPARISON:  Chest CT 08/25/2016. FINDINGS: Cardiovascular: Filling defects are noted throughout the pulmonary arterial tree bilaterally, indicative of massive pulmonary embolus. This includes a small saddle embolus with extensive main, lobar, segmental and subsegmental sized emboli scattered throughout the lungs bilaterally. The majority of these are nonocclusive, however, there are some occlusive emboli, most notably to the right lower lobe. Right ventricle appears dilated relative to the (RV diameter estimated at 66 mm, with LV diameter estimated at 46 mm), concerning for right heart strain. Pulmonic trunk is dilated measuring 4.1 cm  in diameter. There is aortic atherosclerosis, as well as atherosclerosis of the great vessels of the mediastinum and the coronary arteries, including calcified atherosclerotic plaque in the left anterior descending and left circumflex coronary arteries. Mediastinum/Nodes: No pathologically enlarged mediastinal or hilar lymph nodes. Esophagus is unremarkable in appearance. No axillary lymphadenopathy. Lungs/Pleura: Patchy ground-glass attenuation noted in the lungs bilaterally, most evident in the apex of the right upper  lobe and in the posterior aspect of the left upper lobe, likely to reflect areas of hemorrhage from pulmonary infarction. No pleural effusions. No suspicious appearing pulmonary nodules or masses. Upper Abdomen: Status post cholecystectomy. Tiny calcified granulomas are noted in the liver. Musculoskeletal: There are no aggressive appearing lytic or blastic lesions noted in the visualized portions of the skeleton. Review of the MIP images confirms the above findings. IMPRESSION: 1. Positive for acute PE with CT evidence of right heart strain (RV/LV Ratio = 1.43) and probable pulmonary arterial hypertension consistent with at least submassive (intermediate risk) PE. The presence of right heart strain has been associated with an increased risk of morbidity and mortality. 2. Small areas of ground-glass attenuation in the lungs bilaterally, likely to reflect small areas of hemorrhage from pulmonary infarction. 3. Aortic atherosclerosis, in addition to 2 vessel coronary artery disease. Please note that although the presence of coronary artery calcium documents the presence of coronary artery disease, the severity of this disease and any potential stenosis cannot be assessed on this non-gated CT examination. Assessment for potential risk factor modification, dietary therapy or pharmacologic therapy may be warranted, if clinically indicated. Critical Value/emergent results were called by telephone at the time of interpretation on 07/13/2018 at 5:50 pm to Dr. Sherrie George, who verbally acknowledged these results. Aortic Atherosclerosis (ICD10-I70.0). Electronically Signed   By: Vinnie Langton M.D.   On: 07/13/2018 17:54   Dg Chest Portable 1 View  Result Date: 07/13/2018 CLINICAL DATA:  Shortness of breath EXAM: PORTABLE CHEST 1 VIEW COMPARISON:  July 04, 2018 FINDINGS: The heart size is enlarged. There is vascular congestion without overt pulmonary edema. There is no pneumothorax. No large pleural effusion. No acute  osseous abnormality. There are scattered areas of pleuroparenchymal scarring versus chronic atelectasis bilaterally. Aortic calcifications are noted. IMPRESSION: Cardiomegaly with vascular congestion. Electronically Signed   By: Constance Holster M.D.   On: 07/13/2018 13:54    Pending Labs Unresulted Labs (From admission, onward)    Start     Ordered   07/14/18 0200  Heparin level (unfractionated)  Once-Timed,   STAT     07/13/18 1930   Signed and Held  HIV antibody (Routine Testing)  Once,   R     Signed and Held   Signed and Held  Basic metabolic panel  Tomorrow morning,   R     Signed and Held   Signed and Held  CBC  Tomorrow morning,   R     Signed and Held          Vitals/Pain Today's Vitals   07/13/18 1630 07/13/18 1700 07/13/18 1830 07/13/18 1923  BP: (!) 124/93 124/89 99/73 103/70  Pulse: 89 91 98 95  Resp: 18 (!) 22 (!) 24 (!) 26  Temp:      SpO2: 96% 95% 97% 96%  Weight:      Height:      PainSc:        Isolation Precautions Airborne and Contact precautions  Medications Medications  heparin ADULT infusion 100 units/mL (25000 units/282mL sodium chloride 0.45%) (  1,400 Units/hr Intravenous New Bag/Given 07/13/18 1917)  ipratropium-albuterol (DUONEB) 0.5-2.5 (3) MG/3ML nebulizer solution 3 mL (3 mLs Nebulization Given 07/13/18 1328)  methylPREDNISolone sodium succinate (SOLU-MEDROL) 125 mg/2 mL injection 125 mg (125 mg Intravenous Given 07/13/18 1328)  diphenhydrAMINE (BENADRYL) injection 50 mg (50 mg Intravenous Given 07/13/18 1644)  iohexol (OMNIPAQUE) 350 MG/ML injection 75 mL (75 mLs Intravenous Contrast Given 07/13/18 1729)  heparin bolus via infusion 5,000 Units (5,000 Units Intravenous Bolus from Bag 07/13/18 1922)    Mobility walks with person assist Low fall risk   Focused Assessments Cardiac Assessment Handoff:  Cardiac Rhythm: Normal sinus rhythm Lab Results  Component Value Date   TROPONINI <0.03 10/19/2016   No results found for: DDIMER Does the  Patient currently have chest pain? No     R Recommendations: See Admitting Provider Note  Report given to:  Georgina Peer RN  Additional Notes:

## 2018-07-13 NOTE — ED Notes (Signed)
Pt placed on 4L [place] at this time.  Will monitor closely.

## 2018-07-13 NOTE — ED Provider Notes (Signed)
-----------------------------------------   2:04 PM on 07/13/2018 -----------------------------------------  ED ECG REPORT I, Arta Silence, the attending physician, personally viewed and interpreted this ECG.  Date: 07/13/2018 EKG Time: 1307 Rate: 93 Rhythm: normal sinus rhythm QRS Axis: normal Intervals: Nonspecific IVCD ST/T Wave abnormalities: Nonspecific anterolateral T wave abnormalities Narrative Interpretation: Nonspecific abnormalities with no significant changes when compared to EKG of 09/09/2017 no evidence of acute ischemia    Arta Silence, MD 07/13/18 1405

## 2018-07-13 NOTE — Consult Note (Signed)
ANTICOAGULATION CONSULT NOTE - Initial Consult  Pharmacy Consult for Heparin Indication: DVT  Allergies  Allergen Reactions  . Hydrocodone-Acetaminophen Swelling  . Iodine Anaphylaxis and Swelling  . Other Other (See Comments)    ALLERGY TO METAL - BLACKENS SKIN AND CAUSES A RASH   . Tape Swelling and Other (See Comments)    Skin comes off.  Paper tape is ok  . Shellfish Allergy Nausea And Vomiting and Other (See Comments)    Episode of GI infection after eating clam chowder. Still eats shrimp and other seafood    Patient Measurements: Height: 5\' 7"  (170.2 cm) Weight: 285 lb (129.3 kg) IBW/kg (Calculated) : 61.6 Heparin Dosing Weight: 92.7 kg  Vital Signs: Temp: 97.6 F (36.4 C) (07/01 1302) BP: 103/70 (07/01 1923) Pulse Rate: 95 (07/01 1923)  Labs: Recent Labs    07/13/18 1337 07/13/18 1531 07/13/18 1850  HGB 13.2  --   --   HCT 40.4  --   --   PLT 247  --   --   APTT  --   --  <24*  LABPROT  --   --  12.7  INR  --   --  1.0  CREATININE 0.72  --   --   TROPONINIHS 113* 147*  --     Estimated Creatinine Clearance: 95.6 mL/min (by C-G formula based on SCr of 0.72 mg/dL).   Medical History: Past Medical History:  Diagnosis Date  . Allergic rhinitis   . Arthritis   . Asthma    problems with fumes and aerosols cause asthma  . Chest pain 10/19/2016  . Complication of anesthesia    awakens during surgery; has occurred with last 3-4 surgeries   . COPD (chronic obstructive pulmonary disease) ([place])   . Environmental allergies    fumes   . Fibromyalgia   . GERD (gastroesophageal reflux disease)   . Headache   . Hemorrhoids   . History of bronchitis   . Hx of total knee arthroplasty 12/13/2015  . Hypertension   . Morbid obesity with BMI of 45.0-49.9, adult ([place]) 07/25/2015  . NICM (nonischemic cardiomyopathy) ([place])    a. ? PVC mediated;  b. 06/2016 Echo: EF 25-30%, mild LVH, mild LAE, mild AI/MR/TR/PR; c. 08/2016 Cath: nl cors, EF 25%.  . Numbness    hands  bilat when driving; improves when not preforming task   . Osteoarthritis of left knee 08/02/2015  . Pes anserinus bursitis of left knee 05/18/2016  . Presence of right artificial knee joint 05/18/2016  . PVC (premature ventricular contraction) 06/04/2017  . PVC's (premature ventricular contractions)    a. 11/2016 Amio started;  b. 11/29/16 48h Holter: 40768 PVC's (41%); c. 12/2016 24h Holter: 18040 PVC's (15%); c. 02/2017 48h Holter: 37262 PVC's (41%).  Marland Kitchen Spinal stenosis of lumbar region 06/19/2015  . Status post gastric banding   . 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 Tinnitus    comes and goes   . Unilateral primary osteoarthritis, right knee 12/13/2015  . Vertigo    6 months ago approx     Medications:  (Not in a hospital admission)  Scheduled:   Infusions:  . heparin 1,400 Units/hr (07/13/18 1917)   PRN:  Anti-infectives (From admission, onward)   None      Assessment: Pharmacy consulted to initiate Heparin infusion in 68yo patient. Patient has taken no prior anticoagulants and no recent history of bleeding or surgery. Will initiate therapy.   Baseline llabs 7/1: APTT: <24sec,  INR: 1.0, Hgb 13.2  Goal of Therapy:  Heparin level 0.3-0.7 units/ml Monitor platelets by anticoagulation protocol: Yes   Plan:  Give 5000 units bolus x 1 Start heparin infusion at 1400 units/hr Check anti-Xa level in 6 hours and daily while on heparin Continue to monitor H&H and platelets  Wendy Sanchez 07/13/2018,7:25 PM

## 2018-07-13 NOTE — ED Notes (Signed)
Responded to call bell. Pt states that the room started spinning and she felt very disoriented. Sx resolved before this nurse arrived in the room, also Pt reports new onset anterior headache beginning in the last hour. Pt A&Ox4 at this time.

## 2018-07-13 NOTE — H&P (Signed)
[facility] NAME: Wendy Sanchez    MR#:  [id number]  DATE OF BIRTH:  May 17, 1950  DATE OF ADMISSION:  07/13/2018  PRIMARY CARE PHYSICIAN: [name], MD   REQUESTING/REFERRING PHYSICIAN: Dr. Delman Kitten.   CHIEF COMPLAINT:   Chief Complaint  Patient presents with  . Shortness of Breath    HISTORY OF PRESENT ILLNESS:  Wendy Sanchez  is a 68 y.o. female with a known history of morbid obesity, nonischemic cardiomyopathy EF of 25%, COPD, fibromyalgia, GERD, history of recurrent PVCs, vertigo, osteoarthritis who presented to the hospital due to shortness of breath.  Patient says she has been having worsening shortness of breath over the past few days to week progressively getting worse.  She went to urgent care a few days ago and she had x-rays and blood work done and she was treated for suspected bronchitis and placed on prednisone.  She has not improved and therefore comes to the ER for further evaluation.  In the emergency room patient was noted to be in acute respiratory failure with hypoxia with O2 sats in the low to mid 80s.  Patient underwent a CT of the chest which was positive for acute pulmonary embolism.  Hospitalist services were contacted for admission.  Patient does say that she has noticed that she has had some cramping in her calves and both of her legs about a week to 10 days ago.  Does complain of some swelling in her lower extremities bilaterally over the past few days.  She denies any cough, fever, chills, chest pains, nausea vomiting or any other associated symptoms.  PAST MEDICAL HISTORY:   Past Medical History:  Diagnosis Date  . Allergic rhinitis   . Arthritis   . Asthma    problems with fumes and aerosols cause asthma  . Chest pain 10/19/2016  . Complication of anesthesia    awakens during surgery; has occurred with last 3-4 surgeries   . COPD (chronic obstructive pulmonary disease) ([place])   . Environmental  allergies    fumes   . Fibromyalgia   . GERD (gastroesophageal reflux disease)   . Headache   . Hemorrhoids   . History of bronchitis   . Hx of total knee arthroplasty 12/13/2015  . Hypertension   . Morbid obesity with BMI of 45.0-49.9, adult ([place]) 07/25/2015  . NICM (nonischemic cardiomyopathy) ([place])    a. ? PVC mediated;  b. 06/2016 Echo: EF 25-30%, mild LVH, mild LAE, mild AI/MR/TR/PR; c. 08/2016 Cath: nl cors, EF 25%.  . Numbness    hands bilat when driving; improves when not preforming task   . Osteoarthritis of left knee 08/02/2015  . Pes anserinus bursitis of left knee 05/18/2016  . Presence of right artificial knee joint 05/18/2016  . PVC (premature ventricular contraction) 06/04/2017  . PVC's (premature ventricular contractions)    a. 11/2016 Amio started;  b. 11/29/16 48h Holter: 46962 PVC's (41%); c. 12/2016 24h Holter: 18040 PVC's (15%); c. 02/2017 48h Holter: 37262 PVC's (41%).  Marland Kitchen Spinal stenosis of lumbar region 06/19/2015  . Status post gastric banding   . 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 Tinnitus    comes and goes   . Unilateral primary osteoarthritis, right knee 12/13/2015  . Vertigo    6 months ago approx     PAST SURGICAL HISTORY:   Past Surgical History:  Procedure Laterality Date  . ABDOMINAL HYSTERECTOMY  1979   left ovary  remains  . ABDOMINAL HYSTERECTOMY    . CHOLECYSTECTOMY    . COLONOSCOPY    . fibrous tissue removed from right shoulder and back of neck      2 years ago   . GANGLION CYST EXCISION    . GASTRIC BYPASS  1980   states had bypass with banding and band left in -  . GASTRIC BYPASS OPEN    . JOINT REPLACEMENT    . KNEE SURGERY Bilateral    both knees  2001  . NECK SURGERY N/A 1205/19   ganglion cyst removal, benigh   . PVC ABLATION N/A 06/04/2017   Procedure: PVC ABLATION;  Surgeon: Evans Lance, MD;  Location: [facility];  Service: Cardiovascular;  Laterality: N/A;  . RIGHT/LEFT HEART CATH AND CORONARY ANGIOGRAPHY  Bilateral 08/12/2016   Procedure: Right/Left Heart Cath and Coronary Angiography;  Surgeon: Yolonda Kida, MD;  Location: [facility];  Service: Cardiovascular;  Laterality: Bilateral;  . TONSILLECTOMY     age 39  . TOTAL KNEE ARTHROPLASTY Left 08/02/2015   Procedure: LEFT TOTAL KNEE ARTHROPLASTY;  Surgeon: Mcarthur Rossetti, MD;  Location: [facility];  Service: Orthopedics;  Laterality: Left;  . TOTAL KNEE ARTHROPLASTY Right 12/13/2015   Procedure: RIGHT TOTAL KNEE ARTHROPLASTY;  Surgeon: Mcarthur Rossetti, MD;  Location: [facility];  Service: Orthopedics;  Laterality: Right;    SOCIAL HISTORY:   Social History   Tobacco Use  . Smoking status: Current Some Day Smoker    Packs/day: 0.10    Years: 50.00    Pack years: 5.00    Last attempt to quit: 08/22/2016    Years since quitting: 1.8  . Smokeless tobacco: Former User    Types: Snuff, Sarina Ser    Quit date: 01/13/1995  Substance Use Topics  . Alcohol use: No    Alcohol/week: 0.0 standard drinks    FAMILY HISTORY:   Family History  Problem Relation Age of Onset  . Hypertension Father   . Deep vein thrombosis Father   . Dementia Mother   . Diabetes Sister   . Congestive Heart Failure Sister   . Congestive Heart Failure Brother   . Congestive Heart Failure Daughter   . Congestive Heart Failure Son   . Breast cancer Maternal Aunt        great aunt    DRUG ALLERGIES:   Allergies  Allergen Reactions  . Hydrocodone-Acetaminophen Swelling  . Iodine Anaphylaxis and Swelling  . Other Other (See Comments)    ALLERGY TO METAL - BLACKENS SKIN AND CAUSES A RASH   . Tape Swelling and Other (See Comments)    Skin comes off.  Paper tape is ok  . Shellfish Allergy Nausea And Vomiting and Other (See Comments)    Episode of GI infection after eating clam chowder. Still eats shrimp and other seafood    REVIEW OF SYSTEMS:   Review of Systems  Constitutional: Negative for fever and weight loss.  HENT: Negative for  congestion, nosebleeds and tinnitus.   Eyes: Negative for blurred vision, double vision and redness.  Respiratory: Positive for shortness of breath. Negative for cough and hemoptysis.   Cardiovascular: Positive for leg swelling. Negative for chest pain, orthopnea and PND.  Gastrointestinal: Negative for abdominal pain, diarrhea, melena, nausea and vomiting.  Genitourinary: Negative for dysuria, hematuria and urgency.  Musculoskeletal: Negative for falls and joint pain.  Neurological: Negative for dizziness, tingling, sensory change, focal weakness, seizures, weakness and headaches.  Endo/Heme/Allergies: Negative  for polydipsia. Does not bruise/bleed easily.  Psychiatric/Behavioral: Negative for depression and memory loss. The patient is not nervous/anxious.     MEDICATIONS AT HOME:   Prior to Admission medications   Medication Sig Start Date End Date Taking? Authorizing Provider  albuterol (PROVENTIL HFA;VENTOLIN HFA) 108 (90 Base) MCG/ACT inhaler Inhale 2 puffs into the lungs every 6 (six) hours as needed for wheezing or shortness of breath.   Yes [provider]  aspirin EC 81 MG tablet Take 1 tablet (81 mg total) daily by mouth. 11/18/16  Yes Deboraha Sprang, MD  azelastine (ASTELIN) 0.1 % nasal spray Place into both nostrils 2 (two) times daily. Use in each nostril as directed   Yes [provider]  carvedilol (COREG) 6.25 MG tablet Take 1 tablet (6.25 mg total) by mouth 2 (two) times daily. 09/09/17  Yes Deboraha Sprang, MD  cyclobenzaprine (FLEXERIL) 10 MG tablet Take 1 tablet (10 mg total) by mouth 3 (three) times daily as needed. for muscle spams 09/08/17  Yes Molli Barrows, MD  diclofenac sodium (VOLTAREN) 1 % GEL Apply 2 g topically 4 (four) times daily as needed (pain).    Yes [provider]  diphenhydrAMINE (BENADRYL) 25 mg capsule Take 25 mg by mouth every 8 (eight) hours as needed for allergies.    Yes [provider]  docusate sodium (COLACE)  100 MG capsule Take 200 mg by mouth at bedtime.    Yes [provider]  ELDERBERRY PO Take 1 tablet by mouth daily.   Yes [provider]  folic acid (FOLVITE) 1 MG tablet Take 1 mg by mouth at bedtime.    Yes [provider]  furosemide (LASIX) 40 MG tablet Take 2 tablets (80 mg) by mouth once daily as directed 09/09/17  Yes Deboraha Sprang, MD  gabapentin (NEURONTIN) 300 MG capsule Take 2 capsules (600 mg total) by mouth 3 (three) times daily. 05/02/18 07/31/18 Yes Molli Barrows, MD  guaiFENesin (MUCINEX) 600 MG 12 hr tablet Take 1,200 mg by mouth 2 (two) times daily.   Yes [provider]  ibuprofen (ADVIL) 800 MG tablet Take 1 tablet (800 mg total) by mouth every 8 (eight) hours as needed for moderate pain. 06/30/18 09/28/18 Yes Molli Barrows, MD  levocetirizine (XYZAL) 5 MG tablet Take 5 mg by mouth at bedtime.    Yes [provider]  mirabegron ER (MYRBETRIQ) 50 MG TB24 tablet Take 50 mg by mouth daily.   Yes [provider]  montelukast (SINGULAIR) 10 MG tablet Take 10 mg by mouth at bedtime.   Yes [provider]  Multiple Vitamins-Minerals (MULTIVITAMIN GUMMIES ADULTS) CHEW Chew 2 tablets by mouth daily.    Yes [provider]  nortriptyline (PAMELOR) 50 MG capsule Take 50 mg by mouth at bedtime.   Yes [provider]  nystatin cream (MYCOSTATIN) Apply 1 application topically 2 (two) times daily as needed (irritation).    Yes [provider]  oxyCODONE-acetaminophen (PERCOCET) 7.5-325 MG tablet Take 1 tablet by mouth every 6 (six) hours as needed for up to 30 days for moderate pain or severe pain. 07/20/18 08/19/18 Yes Molli Barrows, MD  pantoprazole (PROTONIX) 40 MG tablet Take 40 mg by mouth 2 (two) times daily.   Yes [provider]  potassium chloride SA (K-DUR,KLOR-CON) 20 MEQ tablet Take 20 mEq by mouth See admin instructions. Take 20 meq every third day   Yes [provider]   Probiotic Product (  PROBIOTIC COLON SUPPORT) CAPS Take 1 capsule by mouth at bedtime.   Yes [provider]  sacubitril-valsartan (ENTRESTO) 24-26 MG Take 1 tablet by mouth 2 (two) times daily. 09/09/17  Yes Deboraha Sprang, MD  sucralfate (CARAFATE) 1 G tablet Take 1 g by mouth 2 (two) times daily as needed (esophageal burning).    Yes [provider]  umeclidinium-vilanterol (ANORO ELLIPTA) 62.5-25 MCG/INH AEPB Inhale 1 puff into the lungs daily. 12/23/17  Yes [provider]  vitamin C (ASCORBIC ACID) 500 MG tablet Take 500 mg by mouth daily.   Yes [provider]  Vitamin D, Ergocalciferol, (DRISDOL) 50000 UNITS CAPS capsule Take 50,000 Units by mouth every 7 (seven) days. thursdays   Yes [provider]      VITAL SIGNS:  Blood pressure 99/73, pulse 98, temperature 97.6 F (36.4 C), resp. rate (!) 24, height 5\' 7"  (1.702 m), weight 129.3 kg, SpO2 97 %.  PHYSICAL EXAMINATION:  Physical Exam  GENERAL:  68 y.o.-year-old obese patient lying in the bed with no acute distress.  EYES: Pupils equal, round, reactive to light and accommodation. No scleral icterus. Extraocular muscles intact.  HEENT: Head atraumatic, normocephalic. Oropharynx and nasopharynx clear. No oropharyngeal erythema, moist oral mucosa  NECK:  Supple, no jugular venous distention. No thyroid enlargement, no tenderness.  LUNGS: Normal breath sounds bilaterally, no wheezing, rales, rhonchi. No use of accessory muscles of respiration.  CARDIOVASCULAR: S1, S2 RRR. No murmurs, rubs, gallops, clicks.  ABDOMEN: Soft, nontender, nondistended. Bowel sounds present. No organomegaly or mass.  EXTREMITIES: + 1 pedal edema b/l, No cyanosis, or clubbing. + 2 pedal & radial pulses b/l.   NEUROLOGIC: Cranial nerves II through XII are intact. No focal Motor or sensory deficits appreciated b/l.  PSYCHIATRIC: The patient is alert and oriented x 3.  SKIN: No obvious rash, lesion, or ulcer.    LABORATORY PANEL:   CBC Recent Labs  Lab 07/13/18 1337  WBC 4.7  HGB 13.2  HCT 40.4  PLT 247   ------------------------------------------------------------------------------------------------------------------  Chemistries  Recent Labs  Lab 07/13/18 1337  NA 144  K 3.4*  CL 110  CO2 22  GLUCOSE 109*  BUN 15  CREATININE 0.72  CALCIUM 8.8*  AST 16  ALT 13  ALKPHOS 70  BILITOT 0.7   ------------------------------------------------------------------------------------------------------------------  Cardiac Enzymes No results for input(s): TROPONINI in the last 168 hours. ------------------------------------------------------------------------------------------------------------------  RADIOLOGY:  Ct Angio Chest Pe W And/or Wo Contrast  Result Date: 07/13/2018 CLINICAL DATA:  68 year old female with history of increasing shortness of breath for the past week. Increased work of breathing. EXAM: CT ANGIOGRAPHY CHEST WITH CONTRAST TECHNIQUE: Multidetector CT imaging of the chest was performed using the standard protocol during bolus administration of intravenous contrast. Multiplanar CT image reconstructions and MIPs were obtained to evaluate the vascular anatomy. CONTRAST:  77mL OMNIPAQUE IOHEXOL 350 MG/ML SOLN COMPARISON:  Chest CT 08/25/2016. FINDINGS: Cardiovascular: Filling defects are noted throughout the pulmonary arterial tree bilaterally, indicative of massive pulmonary embolus. This includes a small saddle embolus with extensive main, lobar, segmental and subsegmental sized emboli scattered throughout the lungs bilaterally. The majority of these are nonocclusive, however, there are some occlusive emboli, most notably to the right lower lobe. Right ventricle appears dilated relative to the (RV diameter estimated at 66 mm, with LV diameter estimated at 46 mm), concerning for right heart strain. Pulmonic trunk is dilated measuring 4.1 cm in diameter. There is aortic  atherosclerosis, as well as atherosclerosis of the great vessels  of the mediastinum and the coronary arteries, including calcified atherosclerotic plaque in the left anterior descending and left circumflex coronary arteries. Mediastinum/Nodes: No pathologically enlarged mediastinal or hilar lymph nodes. Esophagus is unremarkable in appearance. No axillary lymphadenopathy. Lungs/Pleura: Patchy ground-glass attenuation noted in the lungs bilaterally, most evident in the apex of the right upper lobe and in the posterior aspect of the left upper lobe, likely to reflect areas of hemorrhage from pulmonary infarction. No pleural effusions. No suspicious appearing pulmonary nodules or masses. Upper Abdomen: Status post cholecystectomy. Tiny calcified granulomas are noted in the liver. Musculoskeletal: There are no aggressive appearing lytic or blastic lesions noted in the visualized portions of the skeleton. Review of the MIP images confirms the above findings. IMPRESSION: 1. Positive for acute PE with CT evidence of right heart strain (RV/LV Ratio = 1.43) and probable pulmonary arterial hypertension consistent with at least submassive (intermediate risk) PE. The presence of right heart strain has been associated with an increased risk of morbidity and mortality. 2. Small areas of ground-glass attenuation in the lungs bilaterally, likely to reflect small areas of hemorrhage from pulmonary infarction. 3. Aortic atherosclerosis, in addition to 2 vessel coronary artery disease. Please note that although the presence of coronary artery calcium documents the presence of coronary artery disease, the severity of this disease and any potential stenosis cannot be assessed on this non-gated CT examination. Assessment for potential risk factor modification, dietary therapy or pharmacologic therapy may be warranted, if clinically indicated. Critical Value/emergent results were called by telephone at the time of interpretation on  07/13/2018 at 5:50 pm to Dr. Sherrie George, who verbally acknowledged these results. Aortic Atherosclerosis (ICD10-I70.0). Electronically Signed   By: Vinnie Langton M.D.   On: 07/13/2018 17:54   Dg Chest Portable 1 View  Result Date: 07/13/2018 CLINICAL DATA:  Shortness of breath EXAM: PORTABLE CHEST 1 VIEW COMPARISON:  July 04, 2018 FINDINGS: The heart size is enlarged. There is vascular congestion without overt pulmonary edema. There is no pneumothorax. No large pleural effusion. No acute osseous abnormality. There are scattered areas of pleuroparenchymal scarring versus chronic atelectasis bilaterally. Aortic calcifications are noted. IMPRESSION: Cardiomegaly with vascular congestion. Electronically Signed   By: Constance Holster M.D.   On: 07/13/2018 13:54     IMPRESSION AND PLAN:   68 y.o. female with a known history of morbid obesity, nonischemic cardiomyopathy EF of 25%, COPD, fibromyalgia, GERD, history of recurrent PVCs, vertigo, osteoarthritis who presented to the hospital due to shortness of breath.  1.  Acute respiratory failure with hypoxia-secondary to acute pulmonary embolism. - Continue O2 supplementation, will treat underlying PE with heparin drip.  2.  Acute pulmonary embolism-this is a source of patient's shortness of breath and worsening hypoxemia. - This is likely a provoked VTE given patient's poor mobility over the past few months. - Treat patient with IV heparin drip, will get Dopplers of lower extremities to rule out DVT.  If her Doppler studies positive would consider getting vascular surgery consult for possible IVC filter or possible thrombolysis.  3.  Elevated troponin-secondary to supply demand ischemia from hypoxemia and pulmonary embolism.  4.  Severe nonischemic cardiomyopathy with ejection fraction of 25%-clinically patient is not in congestive heart failure. -Continue Lasix, Entresto, carvedilol.  5.  GERD-continue Protonix, Carafate.  6.   Neuropathy-continue gabapentin.  7.  Urinary incontinence-continue Myrbetriq.  All the records are reviewed and case discussed with ED provider. Management plans discussed with the patient, family and they are in agreement.  CODE STATUS: Full code  TOTAL TIME TAKING CARE OF THIS PATIENT: 45 minutes.    Henreitta Leber M.D on 07/13/2018 at 7:23 PM  Between 7am to 6pm - Pager - [phone]  After 6pm go to [email or web address] - password [email or web address] [facility]  [facility]  Office  [phone]  CC: Primary care physician; [name], MD

## 2018-07-13 NOTE — ED Provider Notes (Signed)
NP notified me of PE. Reviewed and concern for a large PE. VS stable. Initiate on heparin bolus plus infusion. No no anticoagulants. Plan to discuss with pulmonary as well for treatment recommendations. Will admit. No history of any recent bleeding or surgery.    Delman Kitten, MD 07/13/18 [id number]

## 2018-07-13 NOTE — ED Notes (Signed)
Date and time results received: 07/13/18 2:32 PM    Test: Troponin Critical Value: 113  Name of Provider Notified: Vladimir Crofts, NP

## 2018-07-13 NOTE — ED Provider Notes (Signed)
[facility] Emergency Department Provider Note  ____________________________________________   None    (approximate)  I have reviewed the triage vital signs and the nursing notes.   HISTORY  Chief Complaint Shortness of Breath   HPI Wendy Sanchez is a 68 y.o. female who presents to the emergency department for treatment and evaluation of shortness of breath.  For the past week or more she has had an increased work of breathing.  She has been evaluated by her cardiologist as well as urgent care and was placed on prednisone.  She states that she is unable to from her bedroom to the kitchen without having to stop several times.  This is unusual for her.  She is not on any oxygen at home.  Upon EMS arrival, oxygen saturation was 87% on room air.  Patient states that her home SPO2 monitors showed 83%.  She also states that her blood pressure has been fluctuating and at times "it will not even read."  EMS stated that she was hypotensive upon arrival.  They gave her a bolus of 200 mL's of normal saline and she is now 120s over 90s.  Patient denies any chest pain.  She states that COVID test 8 days ago was negative.  She has not traveled since then.   Past Medical History:  Diagnosis Date  . Allergic rhinitis   . Arthritis   . Asthma    problems with fumes and aerosols cause asthma  . Chest pain 10/19/2016  . Complication of anesthesia    awakens during surgery; has occurred with last 3-4 surgeries   . COPD (chronic obstructive pulmonary disease) ([place])   . Environmental allergies    fumes   . Fibromyalgia   . GERD (gastroesophageal reflux disease)   . Headache   . Hemorrhoids   . History of bronchitis   . Hx of total knee arthroplasty 12/13/2015  . Hypertension   . Morbid obesity with BMI of 45.0-49.9, adult ([place]) 07/25/2015  . NICM (nonischemic cardiomyopathy) ([place])    a. ? PVC mediated;  b. 06/2016 Echo: EF 25-30%, mild LVH, mild LAE, mild AI/MR/TR/PR; c.  08/2016 Cath: nl cors, EF 25%.  . Numbness    hands bilat when driving; improves when not preforming task   . Osteoarthritis of left knee 08/02/2015  . Pes anserinus bursitis of left knee 05/18/2016  . Presence of right artificial knee joint 05/18/2016  . PVC (premature ventricular contraction) 06/04/2017  . PVC's (premature ventricular contractions)    a. 11/2016 Amio started;  b. 11/29/16 48h Holter: 69629 PVC's (41%); c. 12/2016 24h Holter: 18040 PVC's (15%); c. 02/2017 48h Holter: 37262 PVC's (41%).  Marland Kitchen Spinal stenosis of lumbar region 06/19/2015  . Status post gastric banding   . 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 Tinnitus    comes and goes   . Unilateral primary osteoarthritis, right knee 12/13/2015  . Vertigo    6 months ago approx     Patient Active Problem List   Diagnosis Date Noted  . Pulmonary embolism ([place]) 07/13/2018  . DDD (degenerative disc disease), lumbar 06/30/2018  . Chronic low back pain 06/30/2018  . Hip pain, bilateral 06/30/2018  . Chronic, continuous use of opioids 06/30/2018  . PVC (premature ventricular contraction) 06/04/2017  . Chest pain 10/19/2016  . Presence of right artificial knee joint 05/18/2016  . Pes anserinus bursitis of left knee 05/18/2016  . Unilateral primary osteoarthritis, right knee 12/13/2015  . Hx  of total knee arthroplasty 12/13/2015  . Osteoarthritis of left knee 08/02/2015  . 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 Morbid obesity with BMI of 45.0-49.9, adult ([place]) 07/25/2015  . Spinal stenosis of lumbar region 06/19/2015  . Chronic pain syndrome 10/30/2014    Past Surgical History:  Procedure Laterality Date  . ABDOMINAL HYSTERECTOMY  1979   left ovary remains  . ABDOMINAL HYSTERECTOMY    . CHOLECYSTECTOMY    . COLONOSCOPY    . fibrous tissue removed from right shoulder and back of neck      2 years ago   . GANGLION CYST EXCISION    . GASTRIC BYPASS  1980   states had bypass with banding and band  left in -  . GASTRIC BYPASS OPEN    . JOINT REPLACEMENT    . KNEE SURGERY Bilateral    both knees  2001  . NECK SURGERY N/A 1205/19   ganglion cyst removal, [place]   . PVC ABLATION N/A 06/04/2017   Procedure: PVC ABLATION;  Surgeon: Evans Lance, MD;  Location: [place] [facility];  Service: Cardiovascular;  Laterality: N/A;  . RIGHT/LEFT HEART CATH AND CORONARY ANGIOGRAPHY Bilateral 08/12/2016   Procedure: Right/Left Heart Cath and Coronary Angiography;  Surgeon: Yolonda Kida, MD;  Location: [place] [facility];  Service: Cardiovascular;  Laterality: Bilateral;  . TONSILLECTOMY     age 56  . TOTAL KNEE ARTHROPLASTY Left 08/02/2015   Procedure: LEFT TOTAL KNEE ARTHROPLASTY;  Surgeon: Mcarthur Rossetti, MD;  Location: [facility];  Service: Orthopedics;  Laterality: Left;  . TOTAL KNEE ARTHROPLASTY Right 12/13/2015   Procedure: RIGHT TOTAL KNEE ARTHROPLASTY;  Surgeon: Mcarthur Rossetti, MD;  Location: [facility];  Service: Orthopedics;  Laterality: Right;    Prior to Admission medications   Medication Sig Start Date End Date Taking? Authorizing Provider  albuterol (PROVENTIL HFA;VENTOLIN HFA) 108 (90 Base) MCG/ACT inhaler Inhale 2 puffs into the lungs every 6 (six) hours as needed for wheezing or shortness of breath.   Yes [provider]  aspirin EC 81 MG tablet Take 1 tablet (81 mg total) daily by mouth. 11/18/16  Yes Deboraha Sprang, MD  azelastine (ASTELIN) 0.1 % nasal spray Place into both nostrils 2 (two) times daily. Use in each nostril as directed   Yes [provider]  carvedilol (COREG) 6.25 MG tablet Take 1 tablet (6.25 mg total) by mouth 2 (two) times daily. 09/09/17  Yes Deboraha Sprang, MD  cyclobenzaprine (FLEXERIL) 10 MG tablet Take 1 tablet (10 mg total) by mouth 3 (three) times daily as needed. for muscle spams 09/08/17  Yes Molli Barrows, MD  diclofenac sodium (VOLTAREN) 1 % GEL Apply 2 g topically 4 (four) times daily as needed (pain).    Yes  [provider]  diphenhydrAMINE (BENADRYL) 25 mg capsule Take 25 mg by mouth every 8 (eight) hours as needed for allergies.    Yes [provider]  docusate sodium (COLACE) 100 MG capsule Take 200 mg by mouth at bedtime.    Yes [provider]  ELDERBERRY PO Take 1 tablet by mouth daily.   Yes [provider]  folic acid (FOLVITE) 1 MG tablet Take 1 mg by mouth at bedtime.    Yes [provider]  furosemide (LASIX) 40 MG tablet Take 2 tablets (80 mg) by mouth once daily as directed 09/09/17  Yes Deboraha Sprang, MD  gabapentin (NEURONTIN) 300 MG capsule Take 2  capsules (600 mg total) by mouth 3 (three) times daily. 05/02/18 07/31/18 Yes Molli Barrows, MD  guaiFENesin (MUCINEX) 600 MG 12 hr tablet Take 1,200 mg by mouth 2 (two) times daily.   Yes [provider]  ibuprofen (ADVIL) 800 MG tablet Take 1 tablet (800 mg total) by mouth every 8 (eight) hours as needed for moderate pain. 06/30/18 09/28/18 Yes Molli Barrows, MD  levocetirizine (XYZAL) 5 MG tablet Take 5 mg by mouth at bedtime.    Yes [provider]  mirabegron ER (MYRBETRIQ) 50 MG TB24 tablet Take 50 mg by mouth daily.   Yes [provider]  montelukast (SINGULAIR) 10 MG tablet Take 10 mg by mouth at bedtime.   Yes [provider]  Multiple Vitamins-Minerals (MULTIVITAMIN GUMMIES ADULTS) CHEW Chew 2 tablets by mouth daily.    Yes [provider]  nortriptyline (PAMELOR) 50 MG capsule Take 50 mg by mouth at bedtime.   Yes [provider]  nystatin cream (MYCOSTATIN) Apply 1 application topically 2 (two) times daily as needed (irritation).    Yes [provider]  oxyCODONE-acetaminophen (PERCOCET) 7.5-325 MG tablet Take 1 tablet by mouth every 6 (six) hours as needed for up to 30 days for moderate pain or severe pain. 07/20/18 08/19/18 Yes Molli Barrows, MD  pantoprazole (PROTONIX) 40 MG tablet Take 40 mg by mouth 2 (two) times daily.   Yes  [provider]  potassium chloride SA (K-DUR,KLOR-CON) 20 MEQ tablet Take 20 mEq by mouth See admin instructions. Take 20 meq every third day   Yes [provider]  Probiotic Product (PROBIOTIC COLON SUPPORT) CAPS Take 1 capsule by mouth at bedtime.   Yes [provider]  sacubitril-valsartan (ENTRESTO) 24-26 MG Take 1 tablet by mouth 2 (two) times daily. 09/09/17  Yes Deboraha Sprang, MD  sucralfate (CARAFATE) 1 G tablet Take 1 g by mouth 2 (two) times daily as needed (esophageal burning).    Yes [provider]  umeclidinium-vilanterol (ANORO ELLIPTA) 62.5-25 MCG/INH AEPB Inhale 1 puff into the lungs daily. 12/23/17  Yes [provider]  vitamin C (ASCORBIC ACID) 500 MG tablet Take 500 mg by mouth daily.   Yes [provider]  Vitamin D, Ergocalciferol, (DRISDOL) 50000 UNITS CAPS capsule Take 50,000 Units by mouth every 7 (seven) days. thursdays   Yes [provider]    Allergies Hydrocodone-acetaminophen, Iodine, Other, Tape, and Shellfish allergy  Family History  Problem Relation Age of Onset  . Hypertension Father   . Deep vein thrombosis Father   . Dementia Mother   . Diabetes Sister   . Congestive Heart Failure Sister   . Congestive Heart Failure Brother   . Congestive Heart Failure Daughter   . Congestive Heart Failure Son   . Breast cancer Maternal Aunt        great aunt    Social History Social History   Tobacco Use  . Smoking status: Current Some Day Smoker    Packs/day: 0.10    Years: 50.00    Pack years: 5.00    Last attempt to quit: 08/22/2016    Years since quitting: 1.8  . Smokeless tobacco: Former User    Types: Snuff, Sarina Ser    Quit date: 01/13/1995  Substance Use Topics  . Alcohol use: No    Alcohol/week: 0.0 standard drinks  . Drug use: No    Review of Systems  Constitutional: No fever/chills. Eyes: No visual changes. ENT: No sore throat. Cardiovascular:  Positive for chest pressure.  Positive for pleuritic pain. negative for palpitations. Negaive for leg pain. Respiratory: Positive for shortness of breath. Gastrointestinal: Negative for abdominal pain. Negative for nausea, no vomiting.  No diarrhea.  No constipation. Genitourinary: Negative for dysuria. Musculoskeletal: Negative for back pain.  Skin: Negative for rash, lesion, wound. Neurological: Negative for headaches, focal weakness or numbness. ____________________________________________   PHYSICAL EXAM:  VITAL SIGNS: ED Triage Vitals  Enc Vitals Group     BP 07/13/18 1302 (!) 122/91     Pulse Rate 07/13/18 1302 93     Resp 07/13/18 1302 18     Temp 07/13/18 1302 97.6 F (36.4 C)     Temp src --      SpO2 07/13/18 1249 (!) 87 %     Weight 07/13/18 1302 285 lb (129.3 kg)     Height 07/13/18 1302 5\' 7"  (1.702 m)     Head Circumference --      Peak Flow --      Pain Score 07/13/18 1302 0     Pain Loc --      Pain Edu? --      Excl. in [place]? --     Constitutional: Alert and oriented. Acutely ill appearing and in no acute distress. Normal mental status. Eyes: Conjunctivae are normal. PERRL. Head: Atraumatic. Nose: No congestion/rhinnorhea. Mouth/Throat: Mucous membranes are moist.  Oropharynx non-erythematous. Tongue normal in size and color. Neck: No stridor. No carotid bruit appreciated on exam. Hematological/Lymphatic/Immunilogical: No cervical lymphadenopathy. Cardiovascular: Normal rate, regular rhythm. Grossly normal heart sounds.  Good peripheral circulation. Respiratory: Increased work of breathing.  No retractions. Lungs CTAB. Gastrointestinal: Soft and nontender. No distention. No abdominal bruits. No CVA tenderness. Genitourinary: Exam deferred. Musculoskeletal: No lower extremity tenderness. Right lower extremity edema. Neurologic:  Normal speech and language. No gross focal neurologic deficits are appreciated. Skin:  Skin is warm, dry and intact. No rash noted. Psychiatric: Mood and affect  are normal. Speech and behavior are normal.  ____________________________________________   LABS (all labs ordered are listed, but only abnormal results are displayed)  Labs Reviewed  COMPREHENSIVE METABOLIC PANEL - Abnormal; Notable for the following components:      Result Value   Potassium 3.4 (*)    Glucose, Bld 109 (*)    Calcium 8.8 (*)    Albumin 3.1 (*)    All other components within normal limits  TROPONIN I (HIGH SENSITIVITY) - Abnormal; Notable for the following components:   Troponin I (High Sensitivity) 113 (*)    All other components within normal limits  TROPONIN I (HIGH SENSITIVITY) - Abnormal; Notable for the following components:   Troponin I (High Sensitivity) 147 (*)    All other components within normal limits  BRAIN NATRIURETIC PEPTIDE - Abnormal; Notable for the following components:   B Natriuretic Peptide 564.0 (*)    All other components within normal limits  APTT - Abnormal; Notable for the following components:   aPTT <24 (*)    All other components within normal limits  SARS CORONAVIRUS 2 (HOSPITAL ORDER, [facility])  CBC WITH DIFFERENTIAL/PLATELET  PROTIME-INR  HEPARIN LEVEL (UNFRACTIONATED)   ____________________________________________  EKG  ED ECG REPORT I, Naz Denunzio, FNP-BC personally viewed and interpreted this ECG.   Date: 07/13/2018  EKG Time: 1307  Rate: 93  Rhythm: normal sinus rhythm  Axis: normal  Intervals:IVCD  ST&T Change: no ST elevation  ____________________________________________  RADIOLOGY  ED MD interpretation:    Chest  x-ray shows cardiomegaly with vascular congestion.  No pulmonary edema.  No large pleural effusion.  Official radiology report(s): Ct Angio Chest Pe W And/or Wo Contrast  Result Date: 07/13/2018 CLINICAL DATA:  68 year old female with history of increasing shortness of breath for the past week. Increased work of breathing. EXAM: CT ANGIOGRAPHY CHEST WITH CONTRAST  TECHNIQUE: Multidetector CT imaging of the chest was performed using the standard protocol during bolus administration of intravenous contrast. Multiplanar CT image reconstructions and MIPs were obtained to evaluate the vascular anatomy. CONTRAST:  63mL OMNIPAQUE IOHEXOL 350 MG/ML SOLN COMPARISON:  Chest CT 08/25/2016. FINDINGS: Cardiovascular: Filling defects are noted throughout the pulmonary arterial tree bilaterally, indicative of massive pulmonary embolus. This includes a small saddle embolus with extensive main, lobar, segmental and subsegmental sized emboli scattered throughout the lungs bilaterally. The majority of these are nonocclusive, however, there are some occlusive emboli, most notably to the right lower lobe. Right ventricle appears dilated relative to the (RV diameter estimated at 66 mm, with LV diameter estimated at 46 mm), concerning for right heart strain. Pulmonic trunk is dilated measuring 4.1 cm in diameter. There is aortic atherosclerosis, as well as atherosclerosis of the great vessels of the mediastinum and the coronary arteries, including calcified atherosclerotic plaque in the left anterior descending and left circumflex coronary arteries. Mediastinum/Nodes: No pathologically enlarged mediastinal or hilar lymph nodes. Esophagus is unremarkable in appearance. No axillary lymphadenopathy. Lungs/Pleura: Patchy ground-glass attenuation noted in the lungs bilaterally, most evident in the apex of the right upper lobe and in the posterior aspect of the left upper lobe, likely to reflect areas of hemorrhage from pulmonary infarction. No pleural effusions. No suspicious appearing pulmonary nodules or masses. Upper Abdomen: Status post cholecystectomy. Tiny calcified granulomas are noted in the liver. Musculoskeletal: There are no aggressive appearing lytic or blastic lesions noted in the visualized portions of the skeleton. Review of the MIP images confirms the above findings. IMPRESSION: 1.  Positive for acute PE with CT evidence of right heart strain (RV/LV Ratio = 1.43) and probable pulmonary arterial hypertension consistent with at least submassive (intermediate risk) PE. The presence of right heart strain has been associated with an increased risk of morbidity and mortality. 2. Small areas of ground-glass attenuation in the lungs bilaterally, likely to reflect small areas of hemorrhage from pulmonary infarction. 3. Aortic atherosclerosis, in addition to 2 vessel coronary artery disease. Please note that although the presence of coronary artery calcium documents the presence of coronary artery disease, the severity of this disease and any potential stenosis cannot be assessed on this non-gated CT examination. Assessment for potential risk factor modification, dietary therapy or pharmacologic therapy may be warranted, if clinically indicated. Critical Value/emergent results were called by telephone at the time of interpretation on 07/13/2018 at 5:50 pm to Dr. Sherrie George, who verbally acknowledged these results. Aortic Atherosclerosis (ICD10-I70.0). Electronically Signed   By: Vinnie Langton M.D.   On: 07/13/2018 17:54   Dg Chest Portable 1 View  Result Date: 07/13/2018 CLINICAL DATA:  Shortness of breath EXAM: PORTABLE CHEST 1 VIEW COMPARISON:  July 04, 2018 FINDINGS: The heart size is enlarged. There is vascular congestion without overt pulmonary edema. There is no pneumothorax. No large pleural effusion. No acute osseous abnormality. There are scattered areas of pleuroparenchymal scarring versus chronic atelectasis bilaterally. Aortic calcifications are noted. IMPRESSION: Cardiomegaly with vascular congestion. Electronically Signed   By: Constance Holster M.D.   On: 07/13/2018 13:54    ____________________________________________   PROCEDURES  Procedure(s)  performed: None  Procedures  Critical Care performed: Yes, see critical care  note(s)  ____________________________________________   INITIAL IMPRESSION / ASSESSMENT AND PLAN / ED COURSE  As part of my medical decision making, I reviewed the following data within the [facility] chart reviewed    68 year old female presenting to the emergency department for treatment and evaluation of shortness of breath.  This is been ongoing for the past week.  She has been working with Dr. Clayborn Bigness to figure out what is going on.  She is supposed to have a CTA to rule out blood clot, but has not had that yet. Review of his note, plan was to draw d-dimer and proceed with CTA.   D-dimer on 07/11/18 was positive with a value of >7500.  ----------------------------------------- 1:35 PM on 07/13/2018 -----------------------------------------  Patient's respiratory effort has improved. She is receiving her DuoNeb. SpO2 is 96%.  ----------------------------------------- 6:03 PM on 07/13/2018 -----------------------------------------  Per call from radiologist, CTA chest shows large saddle pulmonary embolus with right heart strain. Partially occlusive.   Patient denies any history of blood disorders, recent surgery, or currently taking anticoagulants. Will start heparin and call pulmonology.   ----------------------------------------- 6:18 PM on 07/13/2018 -----------------------------------------  Dr. Keenan Bachelor with pulmonology returned page. He reviewed the CTA. Recommended anticoagulation, but no interventional thrombectomy at this time. He also incidentally mentions a semi-solid right apical mass on his review of the CT. Will plan to speak with hospitalist for admission.  ----------------------------------------- 6:27 PM on 07/13/2018 -----------------------------------------  Dr. Verdell Carmine accepts the patient for admission. Although the radiology report does not mention the finding of a right apical mass, the concern was passed along for consideration.    CRITICAL CARE Performed by: Sherrie George   Total critical care time: 60 minutes  Critical care time was exclusive of separately billable procedures and treating other patients.  Critical care was necessary to treat or prevent imminent or life-threatening deterioration.  Critical care was time spent personally by me on the following activities: development of treatment plan with patient and/or surrogate as well as nursing, discussions with consultants, evaluation of patient's response to treatment, examination of patient, obtaining history from patient or surrogate, ordering and performing treatments and interventions, ordering and review of laboratory studies, ordering and review of radiographic studies, pulse oximetry and re-evaluation of patient's condition.  Wendy Sanchez was evaluated in Emergency Department on 07/13/2018 for the symptoms described in the history of present illness. She was evaluated in the context of the global COVID-19 pandemic, which necessitated consideration that the patient might be at risk for infection with the SARS-CoV-2 virus that causes COVID-19. Institutional protocols and algorithms that pertain to the evaluation of patients at risk for COVID-19 are in a state of rapid change based on information released by regulatory bodies including the CDC and federal and state organizations. These policies and algorithms were followed during the patient's care in the ED.  ____________________________________________   FINAL CLINICAL IMPRESSION(S) / ED DIAGNOSES  Final diagnoses:  Other acute pulmonary embolism with acute cor pulmonale [facility])     ED Discharge Orders    None       Note:  This document was prepared using Dragon voice recognition software and may include unintentional dictation errors.    Victorino Dike, FNP 07/13/18 2038    Arta Silence, MD 07/15/18 1345

## 2018-07-13 NOTE — ED Triage Notes (Signed)
Pt to ER via EMS from home with c/o increasing SHOB over last week.  Pt was seen at PCP and tested for CoVid which was neg.  Pt was suppose to go for "blood clot" testing.  Pt arrives with notable increased work of breathing.  Pt with hx of CHF and COPD.  Pt arrives on NRB mask.

## 2018-07-14 ENCOUNTER — Inpatient Hospital Stay: Payer: Medicare Other

## 2018-07-14 ENCOUNTER — Other Ambulatory Visit (INDEPENDENT_AMBULATORY_CARE_PROVIDER_SITE_OTHER): Payer: Self-pay | Admitting: Vascular Surgery

## 2018-07-14 ENCOUNTER — Encounter
Admission: EM | Disposition: A | Discharge status: Home / Self Care | Payer: Self-pay | Source: Home / Self Care | Attending: Internal Medicine

## 2018-07-14 DIAGNOSIS — I82431 Acute embolism and thrombosis of right popliteal vein: Secondary | ICD-10-CM

## 2018-07-14 DIAGNOSIS — I2699 Other pulmonary embolism without acute cor pulmonale: Secondary | ICD-10-CM

## 2018-07-14 HISTORY — PX: PULMONARY THROMBECTOMY: CATH118295

## 2018-07-14 LAB — CBC
HCT: 42.4 % (ref 36.0–46.0)
Hemoglobin: 14 g/dL (ref 12.0–15.0)
MCH: 29 pg (ref 26.0–34.0)
MCHC: 33 g/dL (ref 30.0–36.0)
MCV: 88 fL (ref 80.0–100.0)
Platelets: 295 10*3/uL (ref 150–400)
RBC: 4.82 MIL/uL (ref 3.87–5.11)
RDW: 13.5 % (ref 11.5–15.5)
WBC: 6.3 10*3/uL (ref 4.0–10.5)
nRBC: 0 % (ref 0.0–0.2)

## 2018-07-14 LAB — BASIC METABOLIC PANEL
Anion gap: 9 (ref 5–15)
BUN: 14 mg/dL (ref 8–23)
CO2: 23 mmol/L (ref 22–32)
Calcium: 8.5 mg/dL — ABNORMAL LOW (ref 8.9–10.3)
Chloride: 110 mmol/L (ref 98–111)
Creatinine, Ser: 0.69 mg/dL (ref 0.44–1.00)
GFR calc Af Amer: >60 mL/min (ref >60)
GFR calc non Af Amer: >60 mL/min (ref >60)
Glucose, Bld: 135 mg/dL — ABNORMAL HIGH (ref 70–99)
Potassium: 3.8 mmol/L (ref 3.5–5.1)
Sodium: 142 mmol/L (ref 135–145)

## 2018-07-14 LAB — HEPARIN LEVEL (UNFRACTIONATED)
Heparin Unfractionated: 0.56 IU/mL (ref 0.30–0.70)
Heparin Unfractionated: 0.87 IU/mL — ABNORMAL HIGH (ref 0.30–0.70)
Heparin Unfractionated: 0.55 IU/mL (ref 0.30–0.70)
Heparin Unfractionated: 0.75 IU/mL — ABNORMAL HIGH (ref 0.30–0.70)

## 2018-07-14 SURGERY — PULMONARY THROMBECTOMY
Anesthesia: Moderate Sedation | Laterality: Bilateral

## 2018-07-14 MED ORDER — MIDAZOLAM HCL 2 MG/ML PO SYRP
8.0000 mg | ORAL_SOLUTION | Freq: Once | ORAL | Status: DC | PRN
  Filled 2018-07-14: qty 4

## 2018-07-14 MED ORDER — FENTANYL CITRATE (PF) 100 MCG/2ML IJ SOLN
INTRAMUSCULAR | Status: DC | PRN
  Administered 2018-07-14: 25 ug via INTRAVENOUS

## 2018-07-14 MED ORDER — SODIUM CHLORIDE 0.9 % IV SOLN
INTRAVENOUS | Status: DC
  Administered 2018-07-14: 14:00:00 via INTRAVENOUS

## 2018-07-14 MED ORDER — FAMOTIDINE 20 MG PO TABS
ORAL_TABLET | ORAL | Status: AC
  Filled 2018-07-14: qty 2

## 2018-07-14 MED ORDER — CEFAZOLIN SODIUM-DEXTROSE 2-4 GM/100ML-% IV SOLN
2.0000 g | Freq: Once | INTRAVENOUS | Status: AC
  Administered 2018-07-14: 15:00:00 2 g via INTRAVENOUS
  Filled 2018-07-14: qty 100

## 2018-07-14 MED ORDER — HEPARIN SODIUM (PORCINE) 1000 UNIT/ML IJ SOLN
INTRAMUSCULAR | Status: AC
  Filled 2018-07-14: qty 1

## 2018-07-14 MED ORDER — FENTANYL CITRATE (PF) 100 MCG/2ML IJ SOLN
INTRAMUSCULAR | Status: AC
  Filled 2018-07-14: qty 2

## 2018-07-14 MED ORDER — DIPHENHYDRAMINE HCL 50 MG/ML IJ SOLN
INTRAMUSCULAR | Status: AC
  Administered 2018-07-14: 14:00:00 50 mg via INTRAVENOUS
  Filled 2018-07-14: qty 1

## 2018-07-14 MED ORDER — DIPHENHYDRAMINE HCL 50 MG/ML IJ SOLN
50.0000 mg | Freq: Once | INTRAMUSCULAR | Status: AC | PRN
  Administered 2018-07-14: 14:00:00 50 mg via INTRAVENOUS

## 2018-07-14 MED ORDER — ALTEPLASE 2 MG IJ SOLR
INTRAMUSCULAR | Status: DC | PRN
  Administered 2018-07-14: 2 mg
  Administered 2018-07-14: 6 mg

## 2018-07-14 MED ORDER — ONDANSETRON HCL 4 MG/2ML IJ SOLN: 4.0000 mg | Freq: Four times a day (QID) | INTRAMUSCULAR | Status: DC | PRN

## 2018-07-14 MED ORDER — METHYLPREDNISOLONE SODIUM SUCC 125 MG IJ SOLR
INTRAMUSCULAR | Status: AC
  Filled 2018-07-14: qty 2

## 2018-07-14 MED ORDER — HEPARIN SODIUM (PORCINE) 1000 UNIT/ML IJ SOLN
INTRAMUSCULAR | Status: DC | PRN
  Administered 2018-07-14: 3000 [IU] via INTRAVENOUS

## 2018-07-14 MED ORDER — FAMOTIDINE 20 MG PO TABS
40.0000 mg | ORAL_TABLET | Freq: Once | ORAL | Status: AC | PRN
  Administered 2018-07-14: 14:00:00 40 mg via ORAL

## 2018-07-14 MED ORDER — MIDAZOLAM HCL 5 MG/5ML IJ SOLN
INTRAMUSCULAR | Status: AC
  Filled 2018-07-14: qty 5

## 2018-07-14 MED ORDER — MIDAZOLAM HCL 2 MG/2ML IJ SOLN
INTRAMUSCULAR | Status: DC | PRN
  Administered 2018-07-14: 1 mg via INTRAVENOUS

## 2018-07-14 MED ORDER — LIDOCAINE-EPINEPHRINE (PF) 1 %-1:200000 IJ SOLN
INTRAMUSCULAR | Status: AC
  Filled 2018-07-14: qty 30

## 2018-07-14 MED ORDER — METHYLPREDNISOLONE SODIUM SUCC 125 MG IJ SOLR
125.0000 mg | Freq: Once | INTRAMUSCULAR | Status: AC | PRN
  Administered 2018-07-14: 14:00:00 125 mg via INTRAVENOUS

## 2018-07-14 SURGICAL SUPPLY — 12 items
CANISTER PENUMBRA ENGINE (MISCELLANEOUS) ×2 IMPLANT
CATH ANGIO 5F 100CM .035 PIG (CATHETERS) ×2 IMPLANT
CATH INDIGO CAT8 TORQ 85 KIT (CATHETERS) ×2 IMPLANT
CATH INDIGO SEP 8 (CATHETERS) ×2 IMPLANT
CATH INFINITI JR4 5F (CATHETERS) ×2 IMPLANT
GLIDEWIRE ADV .035X260CM (WIRE) ×2 IMPLANT
PACK ANGIOGRAPHY (CUSTOM PROCEDURE TRAY) ×3 IMPLANT
SHEATH 9FRX11 (SHEATH) ×2 IMPLANT
SYR MEDRAD MARK V 150ML (SYRINGE) ×3 IMPLANT
TUBING CONTRAST HIGH PRESS 72 (TUBING) ×3 IMPLANT
WIRE J 3MM .035X145CM (WIRE) ×5 IMPLANT
WIRE MAGIC TORQUE 260C (WIRE) ×2 IMPLANT

## 2018-07-14 NOTE — Op Note (Addendum)
[facility]  Percutaneous Study/Intervention Procedural Note   Date of Surgery: 07/14/2018,3:28 PM  Surgeon: Leotis Pain, MD  Pre-operative Diagnosis: Symptomatic bilateral pulmonary emboli  Post-operative diagnosis:  Same  Procedure(s) Performed:  1.  Contrast injection right heart  2.  Thrombolysis bilateral pulmonary arteries with 2 mg of TPA on the left and 6 mg of TPA on the right  3.  Mechanical thrombectomy with the penumbra cat 8 device to bilateral pulmonary arteries including the left main and lower lobe pulmonary arteries and the right main, upper, middle, and lower lobe pulmonary arteries  4.  Selective catheter placement right upper, middle, and lower lobe pulmonary arteries  5.  Selective catheter placement left lower lobe pulmonary artery    Anesthesia: Conscious sedation was administered under my direct supervision by the interventional radiology RN. IV Versed plus fentanyl were utilized. Continuous ECG, pulse oximetry and blood pressure was monitored throughout the entire procedure.  Versed and fentanyl were administered intravenously.  Conscious sedation was administered for a total of 40 minutes using 1 mg of Versed and 25 mcg of fentanyl.  Sheath: 9 French right groin  Contrast: 45 cc   Fluoroscopy Time: 16 minutes  Indications:  Patient presents with pulmonary emboli. The patient is symptomatic with hypoxemia and dyspnea on exertion.  There is evidence of right heart strain on the CT angiogram. The patient is otherwise a good candidate for intervention and even the long-term benefits pulmonary angiography with thrombolysis is offered. The risks and benefits are reviewed long-term benefits are discussed. All questions are answered patient agrees to proceed.  Procedure:  Wendy Sanchez a 68 y.o. female who was identified and appropriate procedural time out was performed.  The patient was then placed supine on the table and prepped and draped in  the usual sterile fashion.  Ultrasound was used to evaluate the right common femoral vein.  It was patent, as it was echolucent and compressible.  A digital ultrasound image was acquired for the permanent record.  A micropuncture needle was used to access the right common femoral vein under direct ultrasound guidance.  A microwire was then advanced under fluoroscopic guidance followed by micro-sheath.  A 0.035 J wire was advanced without resistance and a 5Fr sheath was placed and then upsized to an 8 [place] sheath.    The wire and pigtail catheter were then negotiated into the right atrium and bolus injection of contrast was utilized to demonstrate the right ventricle and the pulmonary artery outflow. The wire and catheter were then negotiated into the main pulmonary artery where hand injection of contrast was utilized to demonstrate the pulmonary arteries and confirm the locations of the pulmonary emboli.  3000 Units of heparin was then given and allowed to circulate.  TPA was reconstituted and delivered onto the table. A total of 8 milligrams of TPA was utilized.  2 mg was administered on the left side and 6 mg was administered on the right side. This was then allowed to dwell.  The Penumbra Cat 8 catheter was then advanced up into the pulmonary vasculature. The right lung was addressed first. Catheter was negotiated into the right main and then lower lobe pulmonary artery and mechanical thrombectomy was performed.  This was done with the penumbra cat 8 device including passes with and without the separator follow-up imaging demonstrated a good result and therefore the catheter was renegotiated into the middle lobe pulmonary artery and again mechanical thrombectomy was performed. Passes were made with  both the Penumbra catheter itself as well as introducing the separator. Follow-up imaging was then performed some thrombus remained in the middle and lower lobes, but was markedly improved.  Using a select  catheter and the advantage wire was able to navigate up into the right upper lobe pulmonary artery and advanced the cat 8 catheter.  Again, passes with the cat 8 catheter alone and including the separator were performed to the right upper lobe pulmonary artery with markedly improved results and minimal residual thrombus in the right upper lobe.  The Penumbra Cat 8 catheter was then negotiated to the opposite side. The left lung was addressed. Catheter was negotiated into the left middle and lower lobe pulmonary artery and mechanical thrombectomy was performed.  This was done with and without the separator with the penumbra cat 8 device.  Follow-up imaging demonstrated a good result with only a small amount of residual thrombus in the left lower lobe.   After review these images wires reintroduced and the catheters were removed the sheath is then pulled and pressures held.     Findings:   Right heart imaging:  Right atrium and right ventricle and the pulmonary outflow tract appears normal  Right lung: Significant thrombus in the right main pulmonary artery and in the right upper, middle, and lower lobes.  Left lung: Significant thrombus in the left main and lower lobe pulmonary artery although less voluminous than the right    Disposition: Patient was taken to the recovery room in stable condition having tolerated the procedure well.  Leotis Pain 07/14/2018,3:28 PM

## 2018-07-14 NOTE — Consult Note (Signed)
ANTICOAGULATION CONSULT NOTE - Initial Consult  Pharmacy Consult for Heparin Indication: DVT  Allergies  Allergen Reactions  . Hydrocodone-Acetaminophen Swelling  . Iodine Anaphylaxis and Swelling  . Other Other (See Comments)    ALLERGY TO METAL - BLACKENS SKIN AND CAUSES A RASH   . Tape Swelling and Other (See Comments)    Skin comes off.  Paper tape is ok  . Shellfish Allergy Nausea And Vomiting and Other (See Comments)    Episode of GI infection after eating clam chowder. Still eats shrimp and other seafood    Patient Measurements: Height: 5\' 7"  (170.2 cm) Weight: 286 lb 14.4 oz (130.1 kg) IBW/kg (Calculated) : 61.6 Heparin Dosing Weight: 92.7 kg  Vital Signs: Temp: 97.8 F (36.6 C) (07/02 1404) Temp Source: Oral (07/02 1404) BP: 123/82 (07/02 1635) Pulse Rate: 82 (07/02 1635)  Labs: Recent Labs    07/13/18 1337 07/13/18 1531 07/13/18 1850 07/14/18 0137 07/14/18 1015 07/14/18 1740  HGB 13.2  --   --  14.0  --   --   HCT 40.4  --   --  42.4  --   --   PLT 247  --   --  295  --   --   APTT  --   --  <24*  --   --   --   LABPROT  --   --  12.7  --   --   --   INR  --   --  1.0  --   --   --   HEPARINUNFRC  --   --   --  0.56 0.87* 0.55  CREATININE 0.72  --   --  0.69  --   --   TROPONINIHS 113* 147*  --   --   --   --     Estimated Creatinine Clearance: 95.9 mL/min (by C-G formula based on SCr of 0.69 mg/dL).   Medical History: Past Medical History:  Diagnosis Date  . Allergic rhinitis   . Arthritis   . Asthma    problems with fumes and aerosols cause asthma  . Chest pain 10/19/2016  . Complication of anesthesia    awakens during surgery; has occurred with last 3-4 surgeries   . COPD (chronic obstructive pulmonary disease) ([place])   . Environmental allergies    fumes   . Fibromyalgia   . GERD (gastroesophageal reflux disease)   . Headache   . Hemorrhoids   . History of bronchitis   . Hx of total knee arthroplasty 12/13/2015  . Hypertension   .  Morbid obesity with BMI of 45.0-49.9, adult ([place]) 07/25/2015  . NICM (nonischemic cardiomyopathy) ([place])    a. ? PVC mediated;  b. 06/2016 Echo: EF 25-30%, mild LVH, mild LAE, mild AI/MR/TR/PR; c. 08/2016 Cath: nl cors, EF 25%.  . Numbness    hands bilat when driving; improves when not preforming task   . Osteoarthritis of left knee 08/02/2015  . Pes anserinus bursitis of left knee 05/18/2016  . Presence of right artificial knee joint 05/18/2016  . PVC (premature ventricular contraction) 06/04/2017  . PVC's (premature ventricular contractions)    a. 11/2016 Amio started;  b. 11/29/16 48h Holter: 19509 PVC's (41%); c. 12/2016 24h Holter: 18040 PVC's (15%); c. 02/2017 48h Holter: 37262 PVC's (41%).  Marland Kitchen Spinal stenosis of lumbar region 06/19/2015  . Status post gastric banding   . 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 Tinnitus    comes and goes   .  Unilateral primary osteoarthritis, right knee 12/13/2015  . Vertigo    6 months ago approx     Medications:  Medications Prior to Admission  Medication Sig Dispense Refill Last Dose  . albuterol (PROVENTIL HFA;VENTOLIN HFA) 108 (90 Base) MCG/ACT inhaler Inhale 2 puffs into the lungs every 6 (six) hours as needed for wheezing or shortness of breath.   prn at prn  . aspirin EC 81 MG tablet Take 1 tablet (81 mg total) daily by mouth. 90 tablet 3 07/12/2018 at Unknown time  . azelastine (ASTELIN) 0.1 % nasal spray Place into both nostrils 2 (two) times daily. Use in each nostril as directed   07/12/2018 at prn  . carvedilol (COREG) 6.25 MG tablet Take 1 tablet (6.25 mg total) by mouth 2 (two) times daily. 180 tablet 3 07/13/2018 at 0915  . cyclobenzaprine (FLEXERIL) 10 MG tablet Take 1 tablet (10 mg total) by mouth 3 (three) times daily as needed. for muscle spams 270 tablet 1 prn at prn  . diclofenac sodium (VOLTAREN) 1 % GEL Apply 2 g topically 4 (four) times daily as needed (pain).    prn at prn  . diphenhydrAMINE (BENADRYL) 25 mg capsule Take 25 mg  by mouth every 8 (eight) hours as needed for allergies.    prn at prn  . docusate sodium (COLACE) 100 MG capsule Take 200 mg by mouth at bedtime.    07/12/2018 at Unknown time  . ELDERBERRY PO Take 1 tablet by mouth daily.   07/12/2018 at Unknown time  . folic acid (FOLVITE) 1 MG tablet Take 1 mg by mouth at bedtime.    07/12/2018 at Unknown time  . furosemide (LASIX) 40 MG tablet Take 2 tablets (80 mg) by mouth once daily as directed 180 tablet 3 07/12/2018 at Unknown time  . gabapentin (NEURONTIN) 300 MG capsule Take 2 capsules (600 mg total) by mouth 3 (three) times daily. 540 capsule 0 07/12/2018 at Unknown time  . guaiFENesin (MUCINEX) 600 MG 12 hr tablet Take 1,200 mg by mouth 2 (two) times daily.   07/12/2018 at Unknown time  . ibuprofen (ADVIL) 800 MG tablet Take 1 tablet (800 mg total) by mouth every 8 (eight) hours as needed for moderate pain. 270 tablet 0 prn at prn  . levocetirizine (XYZAL) 5 MG tablet Take 5 mg by mouth at bedtime.    07/12/2018 at Unknown time  . mirabegron ER (MYRBETRIQ) 50 MG TB24 tablet Take 50 mg by mouth daily.   07/12/2018 at Unknown time  . montelukast (SINGULAIR) 10 MG tablet Take 10 mg by mouth at bedtime.   07/12/2018 at Unknown time  . Multiple Vitamins-Minerals (MULTIVITAMIN GUMMIES ADULTS) CHEW Chew 2 tablets by mouth daily.    Past Week at Unknown time  . nortriptyline (PAMELOR) 50 MG capsule Take 50 mg by mouth at bedtime.   07/12/2018 at Unknown time  . nystatin cream (MYCOSTATIN) Apply 1 application topically 2 (two) times daily as needed (irritation).    07/12/2018 at prn  . [START ON 07/20/2018] oxyCODONE-acetaminophen (PERCOCET) 7.5-325 MG tablet Take 1 tablet by mouth every 6 (six) hours as needed for up to 30 days for moderate pain or severe pain. 120 tablet 0 prn at prn  . pantoprazole (PROTONIX) 40 MG tablet Take 40 mg by mouth 2 (two) times daily.   07/12/2018 at Unknown time  . potassium chloride SA (K-DUR,KLOR-CON) 20 MEQ tablet Take 20 mEq by mouth See  admin instructions. Take 20 meq every third day  Past Week at Unknown time  . Probiotic Product (PROBIOTIC COLON SUPPORT) CAPS Take 1 capsule by mouth at bedtime.   07/12/2018 at Unknown time  . sacubitril-valsartan (ENTRESTO) 24-26 MG Take 1 tablet by mouth 2 (two) times daily. 180 tablet 3 07/13/2018 at 0915  . sucralfate (CARAFATE) 1 G tablet Take 1 g by mouth 2 (two) times daily as needed (esophageal burning).    prn at prn  . umeclidinium-vilanterol (ANORO ELLIPTA) 62.5-25 MCG/INH AEPB Inhale 1 puff into the lungs daily.   07/12/2018 at Unknown time  . vitamin C (ASCORBIC ACID) 500 MG tablet Take 500 mg by mouth daily.   07/12/2018 at Unknown time  . Vitamin D, Ergocalciferol, (DRISDOL) 50000 UNITS CAPS capsule Take 50,000 Units by mouth every 7 (seven) days. thursdays   Past Week at Unknown time   Scheduled:  . acidophilus  1 capsule Oral QHS  . aspirin EC  81 mg Oral Daily  . carvedilol  6.25 mg Oral BID WC  . cetirizine  10 mg Oral Daily  . docusate sodium  200 mg Oral QHS  . famotidine      . fentaNYL      . folic acid  1 mg Oral QHS  . furosemide  80 mg Oral Daily  . gabapentin  600 mg Oral TID  . guaiFENesin  1,200 mg Oral BID  . heparin      . lidocaine-EPINEPHrine      . methylPREDNISolone sodium succinate      . midazolam      . mirabegron ER  50 mg Oral Daily  . montelukast  10 mg Oral QHS  . multivitamin with minerals  1 tablet Oral Daily  . nortriptyline  50 mg Oral QHS  . pantoprazole  40 mg Oral BID  . potassium chloride SA  20 mEq Oral Q72H  . sacubitril-valsartan  1 tablet Oral BID  . umeclidinium-vilanterol  1 puff Inhalation Daily  . vitamin C  500 mg Oral Daily  . Vitamin D (Ergocalciferol)  50,000 Units Oral Q7 days   Infusions:  . heparin 1,200 Units/hr (07/14/18 1732)   PRN:  Anti-infectives (From admission, onward)   Start     Dose/Rate Route Frequency Ordered Stop   07/14/18 1400  ceFAZolin (ANCEF) IVPB 2g/100 mL premix     2 g 200 mL/hr over 30  Minutes Intravenous  Once 07/14/18 1330 07/14/18 1510      Assessment: Pharmacy consulted to initiate Heparin infusion in 68yo patient. Patient has taken no prior anticoagulants and no recent history of bleeding or surgery. Will initiate therapy.   Baseline Labs 7/1: APTT: <24sec, INR: 1.0, Hgb 13.2  07/02 @ 0130 HL 0.56 therapeutic. Continued rate of 1400 units/hr.  07/02 @ 1015 HL 0.87 supratherapeutic. Will need to reduce rate by 2 units/kg/hr. 07/02 @ 1740 HL 0.55 therapeutic  Goal of Therapy:  Heparin level 0.3-0.7 units/ml Monitor platelets by anticoagulation protocol: Yes   Plan:  Will continue heparin infusion at 1200 units/hr Check anti-Xa level in 6 hours and daily while on heparin, per protocol Continue to monitor H&H and platelets   Pearla Dubonnet, PharmD Clinical Pharmacist 07/14/2018 6:15 PM

## 2018-07-14 NOTE — Consult Note (Signed)
ANTICOAGULATION CONSULT NOTE - Initial Consult  Pharmacy Consult for Heparin Indication: DVT  Allergies  Allergen Reactions  . Hydrocodone-Acetaminophen Swelling  . Iodine Anaphylaxis and Swelling  . Other Other (See Comments)    ALLERGY TO METAL - BLACKENS SKIN AND CAUSES A RASH   . Tape Swelling and Other (See Comments)    Skin comes off.  Paper tape is ok  . Shellfish Allergy Nausea And Vomiting and Other (See Comments)    Episode of GI infection after eating clam chowder. Still eats shrimp and other seafood    Patient Measurements: Height: 5\' 7"  (170.2 cm) Weight: 286 lb 14.4 oz (130.1 kg) IBW/kg (Calculated) : 61.6 Heparin Dosing Weight: 92.7 kg  Vital Signs: Temp: 97.7 F (36.5 C) (07/02 0328) Temp Source: Oral (07/02 0328) BP: 121/81 (07/02 0328) Pulse Rate: 93 (07/02 0328)  Labs: Recent Labs    07/13/18 1337 07/13/18 1531 07/13/18 1850 07/14/18 0137  HGB 13.2  --   --  14.0  HCT 40.4  --   --  42.4  PLT 247  --   --  295  APTT  --   --  <24*  --   LABPROT  --   --  12.7  --   INR  --   --  1.0  --   HEPARINUNFRC  --   --   --  0.56  CREATININE 0.72  --   --  0.69  TROPONINIHS 113* 147*  --   --     Estimated Creatinine Clearance: 95.9 mL/min (by C-G formula based on SCr of 0.69 mg/dL).   Medical History: Past Medical History:  Diagnosis Date  . Allergic rhinitis   . Arthritis   . Asthma    problems with fumes and aerosols cause asthma  . Chest pain 10/19/2016  . Complication of anesthesia    awakens during surgery; has occurred with last 3-4 surgeries   . COPD (chronic obstructive pulmonary disease) ([place])   . Environmental allergies    fumes   . Fibromyalgia   . GERD (gastroesophageal reflux disease)   . Headache   . Hemorrhoids   . History of bronchitis   . Hx of total knee arthroplasty 12/13/2015  . Hypertension   . Morbid obesity with BMI of 45.0-49.9, adult ([place]) 07/25/2015  . NICM (nonischemic cardiomyopathy) ([place])    a. ? PVC  mediated;  b. 06/2016 Echo: EF 25-30%, mild LVH, mild LAE, mild AI/MR/TR/PR; c. 08/2016 Cath: nl cors, EF 25%.  . Numbness    hands bilat when driving; improves when not preforming task   . Osteoarthritis of left knee 08/02/2015  . Pes anserinus bursitis of left knee 05/18/2016  . Presence of right artificial knee joint 05/18/2016  . PVC (premature ventricular contraction) 06/04/2017  . PVC's (premature ventricular contractions)    a. 11/2016 Amio started;  b. 11/29/16 48h Holter: 70623 PVC's (41%); c. 12/2016 24h Holter: 18040 PVC's (15%); c. 02/2017 48h Holter: 37262 PVC's (41%).  Marland Kitchen Spinal stenosis of lumbar region 06/19/2015  . Status post gastric banding   . 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 Tinnitus    comes and goes   . Unilateral primary osteoarthritis, right knee 12/13/2015  . Vertigo    6 months ago approx     Medications:  Medications Prior to Admission  Medication Sig Dispense Refill Last Dose  . albuterol (PROVENTIL HFA;VENTOLIN HFA) 108 (90 Base) MCG/ACT inhaler Inhale 2 puffs into the lungs every 6 (  six) hours as needed for wheezing or shortness of breath.   prn at prn  . aspirin EC 81 MG tablet Take 1 tablet (81 mg total) daily by mouth. 90 tablet 3 07/12/2018 at Unknown time  . azelastine (ASTELIN) 0.1 % nasal spray Place into both nostrils 2 (two) times daily. Use in each nostril as directed   07/12/2018 at prn  . carvedilol (COREG) 6.25 MG tablet Take 1 tablet (6.25 mg total) by mouth 2 (two) times daily. 180 tablet 3 07/13/2018 at 0915  . cyclobenzaprine (FLEXERIL) 10 MG tablet Take 1 tablet (10 mg total) by mouth 3 (three) times daily as needed. for muscle spams 270 tablet 1 prn at prn  . diclofenac sodium (VOLTAREN) 1 % GEL Apply 2 g topically 4 (four) times daily as needed (pain).    prn at prn  . diphenhydrAMINE (BENADRYL) 25 mg capsule Take 25 mg by mouth every 8 (eight) hours as needed for allergies.    prn at prn  . docusate sodium (COLACE) 100 MG capsule  Take 200 mg by mouth at bedtime.    07/12/2018 at Unknown time  . ELDERBERRY PO Take 1 tablet by mouth daily.   07/12/2018 at Unknown time  . folic acid (FOLVITE) 1 MG tablet Take 1 mg by mouth at bedtime.    07/12/2018 at Unknown time  . furosemide (LASIX) 40 MG tablet Take 2 tablets (80 mg) by mouth once daily as directed 180 tablet 3 07/12/2018 at Unknown time  . gabapentin (NEURONTIN) 300 MG capsule Take 2 capsules (600 mg total) by mouth 3 (three) times daily. 540 capsule 0 07/12/2018 at Unknown time  . guaiFENesin (MUCINEX) 600 MG 12 hr tablet Take 1,200 mg by mouth 2 (two) times daily.   07/12/2018 at Unknown time  . ibuprofen (ADVIL) 800 MG tablet Take 1 tablet (800 mg total) by mouth every 8 (eight) hours as needed for moderate pain. 270 tablet 0 prn at prn  . levocetirizine (XYZAL) 5 MG tablet Take 5 mg by mouth at bedtime.    07/12/2018 at Unknown time  . mirabegron ER (MYRBETRIQ) 50 MG TB24 tablet Take 50 mg by mouth daily.   07/12/2018 at Unknown time  . montelukast (SINGULAIR) 10 MG tablet Take 10 mg by mouth at bedtime.   07/12/2018 at Unknown time  . Multiple Vitamins-Minerals (MULTIVITAMIN GUMMIES ADULTS) CHEW Chew 2 tablets by mouth daily.    Past Week at Unknown time  . nortriptyline (PAMELOR) 50 MG capsule Take 50 mg by mouth at bedtime.   07/12/2018 at Unknown time  . nystatin cream (MYCOSTATIN) Apply 1 application topically 2 (two) times daily as needed (irritation).    07/12/2018 at prn  . [START ON 07/20/2018] oxyCODONE-acetaminophen (PERCOCET) 7.5-325 MG tablet Take 1 tablet by mouth every 6 (six) hours as needed for up to 30 days for moderate pain or severe pain. 120 tablet 0 prn at prn  . pantoprazole (PROTONIX) 40 MG tablet Take 40 mg by mouth 2 (two) times daily.   07/12/2018 at Unknown time  . potassium chloride SA (K-DUR,KLOR-CON) 20 MEQ tablet Take 20 mEq by mouth See admin instructions. Take 20 meq every third day   Past Week at Unknown time  . Probiotic Product (PROBIOTIC COLON  SUPPORT) CAPS Take 1 capsule by mouth at bedtime.   07/12/2018 at Unknown time  . sacubitril-valsartan (ENTRESTO) 24-26 MG Take 1 tablet by mouth 2 (two) times daily. 180 tablet 3 07/13/2018 at 0915  . sucralfate (CARAFATE)  1 G tablet Take 1 g by mouth 2 (two) times daily as needed (esophageal burning).    prn at prn  . umeclidinium-vilanterol (ANORO ELLIPTA) 62.5-25 MCG/INH AEPB Inhale 1 puff into the lungs daily.   07/12/2018 at Unknown time  . vitamin C (ASCORBIC ACID) 500 MG tablet Take 500 mg by mouth daily.   07/12/2018 at Unknown time  . Vitamin D, Ergocalciferol, (DRISDOL) 50000 UNITS CAPS capsule Take 50,000 Units by mouth every 7 (seven) days. thursdays   Past Week at Unknown time   Scheduled:  . acidophilus  1 capsule Oral QHS  . aspirin EC  81 mg Oral Daily  . carvedilol  6.25 mg Oral BID WC  . cetirizine  10 mg Oral Daily  . docusate sodium  200 mg Oral QHS  . folic acid  1 mg Oral QHS  . furosemide  80 mg Oral Daily  . gabapentin  600 mg Oral TID  . guaiFENesin  1,200 mg Oral BID  . mirabegron ER  50 mg Oral Daily  . montelukast  10 mg Oral QHS  . multivitamin with minerals  1 tablet Oral Daily  . nortriptyline  50 mg Oral QHS  . pantoprazole  40 mg Oral BID  . potassium chloride SA  20 mEq Oral Q72H  . sacubitril-valsartan  1 tablet Oral BID  . umeclidinium-vilanterol  1 puff Inhalation Daily  . vitamin C  500 mg Oral Daily  . Vitamin D (Ergocalciferol)  50,000 Units Oral Q7 days   Infusions:  . heparin 1,400 Units/hr (07/14/18 0300)   PRN:  Anti-infectives (From admission, onward)   None      Assessment: Pharmacy consulted to initiate Heparin infusion in 68yo patient. Patient has taken no prior anticoagulants and no recent history of bleeding or surgery. Will initiate therapy.   Baseline llabs 7/1: APTT: <24sec, INR: 1.0, Hgb 13.2  Goal of Therapy:  Heparin level 0.3-0.7 units/ml Monitor platelets by anticoagulation protocol: Yes   Plan:  07/02 @ 0130 HL  0.56 therapeutic. Will continue current rate and will recheck HL @ 0900, CBC stable will continue to monitor.  Tobie Lords, PharmD, BCPS Clinical Pharmacist 07/14/2018,3:50 AM

## 2018-07-14 NOTE — Progress Notes (Signed)
Advanced care plan. Purpose of the Encounter: CODE STATUS Parties in Attendance: Patient Patient's Decision Capacity: Good Subjective/Patient's story: Wendy Sanchez  is a 68 y.o. female with a known history of morbid obesity, nonischemic cardiomyopathy EF of 25%, COPD, fibromyalgia, GERD, history of recurrent PVCs, vertigo, osteoarthritis who presented to the hospital due to shortness of breath.  Patient says she has been having worsening shortness of breath over the past few days to week progressively getting worse.  She went to urgent care a few days ago and she had x-rays and blood work done and she was treated for suspected bronchitis and placed on prednisone.  She has not improved and therefore comes to the ER for further evaluation.  In the emergency room patient was noted to be in acute respiratory failure with hypoxia with O2 sats in the low to mid 80s.  Patient underwent a CT of the chest which was positive for acute pulmonary embolism.  Hospitalist services were contacted for admission.  Patient does say that she has noticed that she has had some cramping in her calves and both of her legs about a week to 10 days ago.  Does complain of some swelling in her lower extremities bilaterally over the past few days.  She denies any cough, fever, chills, chest pains, nausea vomiting or any other associated symptoms. Objective/Medical story Patient needs anticoagulation with heparin drip and vascular surgery evaluation.  Needs Doppler venous ultrasound for lower extremities for further evaluation of DVT. Goals of care determination:  Advance care directives goals of care and treatment plan discussed. Patient wants everything done which includes CPR, intubation ventilator and the need arises. CODE STATUS: Full code Time spent discussing advanced care planning: 16 minutes

## 2018-07-14 NOTE — Consult Note (Signed)
ANTICOAGULATION CONSULT NOTE - Initial Consult  Pharmacy Consult for Heparin Indication: DVT  Allergies  Allergen Reactions  . Hydrocodone-Acetaminophen Swelling  . Iodine Anaphylaxis and Swelling  . Other Other (See Comments)    ALLERGY TO METAL - BLACKENS SKIN AND CAUSES A RASH   . Tape Swelling and Other (See Comments)    Skin comes off.  Paper tape is ok  . Shellfish Allergy Nausea And Vomiting and Other (See Comments)    Episode of GI infection after eating clam chowder. Still eats shrimp and other seafood    Patient Measurements: Height: 5\' 7"  (170.2 cm) Weight: 286 lb 14.4 oz (130.1 kg) IBW/kg (Calculated) : 61.6 Heparin Dosing Weight: 92.7 kg  Vital Signs: Temp: 97.7 F (36.5 C) (07/02 0328) Temp Source: Oral (07/02 0328) BP: 121/81 (07/02 0328) Pulse Rate: 93 (07/02 0328)  Labs: Recent Labs    07/13/18 1337 07/13/18 1531 07/13/18 1850 07/14/18 0137  HGB 13.2  --   --  14.0  HCT 40.4  --   --  42.4  PLT 247  --   --  295  APTT  --   --  <24*  --   LABPROT  --   --  12.7  --   INR  --   --  1.0  --   HEPARINUNFRC  --   --   --  0.56  CREATININE 0.72  --   --  0.69  TROPONINIHS 113* 147*  --   --     Estimated Creatinine Clearance: 95.9 mL/min (by C-G formula based on SCr of 0.69 mg/dL).   Medical History: Past Medical History:  Diagnosis Date  . Allergic rhinitis   . Arthritis   . Asthma    problems with fumes and aerosols cause asthma  . Chest pain 10/19/2016  . Complication of anesthesia    awakens during surgery; has occurred with last 3-4 surgeries   . COPD (chronic obstructive pulmonary disease) ([place])   . Environmental allergies    fumes   . Fibromyalgia   . GERD (gastroesophageal reflux disease)   . Headache   . Hemorrhoids   . History of bronchitis   . Hx of total knee arthroplasty 12/13/2015  . Hypertension   . Morbid obesity with BMI of 45.0-49.9, adult ([place]) 07/25/2015  . NICM (nonischemic cardiomyopathy) ([place])    a. ? PVC  mediated;  b. 06/2016 Echo: EF 25-30%, mild LVH, mild LAE, mild AI/MR/TR/PR; c. 08/2016 Cath: nl cors, EF 25%.  . Numbness    hands bilat when driving; improves when not preforming task   . Osteoarthritis of left knee 08/02/2015  . Pes anserinus bursitis of left knee 05/18/2016  . Presence of right artificial knee joint 05/18/2016  . PVC (premature ventricular contraction) 06/04/2017  . PVC's (premature ventricular contractions)    a. 11/2016 [place]io started;  b. 11/29/16 48h Holter: 37169 PVC's (41%); c. 12/2016 24h Holter: 18040 PVC's (15%); c. 02/2017 48h Holter: 37262 PVC's (41%).  Marland Kitchen Spinal stenosis of lumbar region 06/19/2015  . Status post gastric banding   . 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 Tinnitus    comes and goes   . Unilateral primary osteoarthritis, right knee 12/13/2015  . Vertigo    6 months ago approx     Medications:  Medications Prior to Admission  Medication Sig Dispense Refill Last Dose  . albuterol (PROVENTIL HFA;VENTOLIN HFA) 108 (90 Base) MCG/ACT inhaler Inhale 2 puffs into the lungs every 6 (  six) hours as needed for wheezing or shortness of breath.   prn at prn  . aspirin EC 81 MG tablet Take 1 tablet (81 mg total) daily by mouth. 90 tablet 3 07/12/2018 at Unknown time  . azelastine (ASTELIN) 0.1 % nasal spray Place into both nostrils 2 (two) times daily. Use in each nostril as directed   07/12/2018 at prn  . carvedilol (COREG) 6.25 MG tablet Take 1 tablet (6.25 mg total) by mouth 2 (two) times daily. 180 tablet 3 07/13/2018 at 0915  . cyclobenzaprine (FLEXERIL) 10 MG tablet Take 1 tablet (10 mg total) by mouth 3 (three) times daily as needed. for muscle spams 270 tablet 1 prn at prn  . diclofenac sodium (VOLTAREN) 1 % GEL Apply 2 g topically 4 (four) times daily as needed (pain).    prn at prn  . diphenhydrAMINE (BENADRYL) 25 mg capsule Take 25 mg by mouth every 8 (eight) hours as needed for allergies.    prn at prn  . docusate sodium (COLACE) 100 MG capsule  Take 200 mg by mouth at bedtime.    07/12/2018 at Unknown time  . ELDERBERRY PO Take 1 tablet by mouth daily.   07/12/2018 at Unknown time  . folic acid (FOLVITE) 1 MG tablet Take 1 mg by mouth at bedtime.    07/12/2018 at Unknown time  . furosemide (LASIX) 40 MG tablet Take 2 tablets (80 mg) by mouth once daily as directed 180 tablet 3 07/12/2018 at Unknown time  . gabapentin (NEURONTIN) 300 MG capsule Take 2 capsules (600 mg total) by mouth 3 (three) times daily. 540 capsule 0 07/12/2018 at Unknown time  . guaiFENesin (MUCINEX) 600 MG 12 hr tablet Take 1,200 mg by mouth 2 (two) times daily.   07/12/2018 at Unknown time  . ibuprofen (ADVIL) 800 MG tablet Take 1 tablet (800 mg total) by mouth every 8 (eight) hours as needed for moderate pain. 270 tablet 0 prn at prn  . levocetirizine (XYZAL) 5 MG tablet Take 5 mg by mouth at bedtime.    07/12/2018 at Unknown time  . mirabegron ER (MYRBETRIQ) 50 MG TB24 tablet Take 50 mg by mouth daily.   07/12/2018 at Unknown time  . montelukast (SINGULAIR) 10 MG tablet Take 10 mg by mouth at bedtime.   07/12/2018 at Unknown time  . Multiple Vitamins-Minerals (MULTIVITAMIN GUMMIES ADULTS) CHEW Chew 2 tablets by mouth daily.    Past Week at Unknown time  . nortriptyline (PAMELOR) 50 MG capsule Take 50 mg by mouth at bedtime.   07/12/2018 at Unknown time  . nystatin cream (MYCOSTATIN) Apply 1 application topically 2 (two) times daily as needed (irritation).    07/12/2018 at prn  . [START ON 07/20/2018] oxyCODONE-acetaminophen (PERCOCET) 7.5-325 MG tablet Take 1 tablet by mouth every 6 (six) hours as needed for up to 30 days for moderate pain or severe pain. 120 tablet 0 prn at prn  . pantoprazole (PROTONIX) 40 MG tablet Take 40 mg by mouth 2 (two) times daily.   07/12/2018 at Unknown time  . potassium chloride SA (K-DUR,KLOR-CON) 20 MEQ tablet Take 20 mEq by mouth See admin instructions. Take 20 meq every third day   Past Week at Unknown time  . Probiotic Product (PROBIOTIC COLON  SUPPORT) CAPS Take 1 capsule by mouth at bedtime.   07/12/2018 at Unknown time  . sacubitril-valsartan (ENTRESTO) 24-26 MG Take 1 tablet by mouth 2 (two) times daily. 180 tablet 3 07/13/2018 at 0915  . sucralfate (CARAFATE)  1 G tablet Take 1 g by mouth 2 (two) times daily as needed (esophageal burning).    prn at prn  . umeclidinium-vilanterol (ANORO ELLIPTA) 62.5-25 MCG/INH AEPB Inhale 1 puff into the lungs daily.   07/12/2018 at Unknown time  . vitamin C (ASCORBIC ACID) 500 MG tablet Take 500 mg by mouth daily.   07/12/2018 at Unknown time  . Vitamin D, Ergocalciferol, (DRISDOL) 50000 UNITS CAPS capsule Take 50,000 Units by mouth every 7 (seven) days. thursdays   Past Week at Unknown time   Scheduled:  . acidophilus  1 capsule Oral QHS  . aspirin EC  81 mg Oral Daily  . carvedilol  6.25 mg Oral BID WC  . cetirizine  10 mg Oral Daily  . docusate sodium  200 mg Oral QHS  . folic acid  1 mg Oral QHS  . furosemide  80 mg Oral Daily  . gabapentin  600 mg Oral TID  . guaiFENesin  1,200 mg Oral BID  . mirabegron ER  50 mg Oral Daily  . montelukast  10 mg Oral QHS  . multivitamin with minerals  1 tablet Oral Daily  . nortriptyline  50 mg Oral QHS  . pantoprazole  40 mg Oral BID  . potassium chloride SA  20 mEq Oral Q72H  . sacubitril-valsartan  1 tablet Oral BID  . umeclidinium-vilanterol  1 puff Inhalation Daily  . vitamin C  500 mg Oral Daily  . Vitamin D (Ergocalciferol)  50,000 Units Oral Q7 days   Infusions:  . heparin 1,400 Units/hr (07/14/18 0300)   PRN:  Anti-infectives (From admission, onward)   None      Assessment: Pharmacy consulted to initiate Heparin infusion in 68yo patient. Patient has taken no prior anticoagulants and no recent history of bleeding or surgery. Will initiate therapy.   Baseline Labs 7/1: APTT: <24sec, INR: 1.0, Hgb 13.2  07/02 @ 0130 HL 0.56 therapeutic. Continued rate of 1400 units/hr.  07/02 @ 1015 HL 0.87 supratherapeutic. Will need to reduce rate  by 2 units/kg/hr.  Goal of Therapy:  Heparin level 0.3-0.7 units/ml Monitor platelets by anticoagulation protocol: Yes   Plan:  Start heparin infusion at 1200 units/hr Check anti-Xa level in 6 hours and daily while on heparin, per protocol Continue to monitor H&H and platelets   Kristeen Miss, PharmD Clinical Pharmacist 07/14/2018,9:03 AM

## 2018-07-14 NOTE — Consult Note (Signed)
[facility] Vascular Consult Note  MRN : [id number]  Wendy Sanchez is a 68 y.o. ([birth year]) female who presents with chief complaint of  Chief Complaint  Patient presents with  . Shortness of Breath   History of Present Illness:  The patient is a 68 year old female with a past medical history of morbid obesity, nonischemic cardiomyopathy with an EF of 25%, COPD, fibromyalgia, GERD, history of recurrent PVCs, vertigo, osteoarthritis who presented to the hospital with chief complaint of shortness of breath.  The patient endorses a history of progressively worsening shortness of breath especially with exertion over the last few days which prompted her to seek medical attention.  The patient was seen at an urgent care setting and underwent x-rays blood work and COVID testing.  She was diagnosed with bronchitis and placed on prednisone.  When the patient's symptoms did not improve she sought medical attention in the emergency department.  Upon further work-up in the emergency department the patient was found to have a: CTA (07/13/18): 1. Positive for acute PE with CT evidence of right heart strain (RV/LV Ratio = 1.43) and probable pulmonary arterial hypertension consistent with at least submassive (intermediate risk) PE. The presence of right heart strain has been associated with an increased risk of morbidity and mortality. 2. Small areas of ground-glass attenuation in the lungs bilaterally, likely to reflect small areas of hemorrhage from pulmonary infarction.  Venous Duplex (07/14/18): 1. Examination is positive for occlusive DVT involving the right popliteal vein. 2. No evidence of DVT within the left lower extremity.  Patient was admitted and started on a heparin drip.  The patient notes that she continues to experience shortness of breath even with initiation of heparin.  The patient is currently on nasal cannula.  Vascular surgery was consulted by Dr. Leroy Libman for  consideration of pulmonary lysis. Current Facility-Administered Medications  Medication Dose Route Frequency Provider Last Rate Last Dose  . acetaminophen (TYLENOL) tablet 650 mg  650 mg Oral Q6H PRN Henreitta Leber, MD       Or  . acetaminophen (TYLENOL) suppository 650 mg  650 mg Rectal Q6H PRN Henreitta Leber, MD      . acidophilus (RISAQUAD) capsule 1 capsule  1 capsule Oral QHS Henreitta Leber, MD   1 capsule at 07/13/18 2253  . albuterol (PROVENTIL) (2.5 MG/3ML) 0.083% nebulizer solution 2.5 mg  2.5 mg Inhalation Q6H PRN Henreitta Leber, MD      . aspirin EC tablet 81 mg  81 mg Oral Daily Henreitta Leber, MD   81 mg at 07/14/18 9518  . azelastine (ASTELIN) 0.1 % nasal spray 2 spray  2 spray Each Nare BID PRN Henreitta Leber, MD      . carvedilol (COREG) tablet 6.25 mg  6.25 mg Oral BID WC Henreitta Leber, MD   6.25 mg at 07/14/18 0938  . cetirizine (ZYRTEC) tablet 10 mg  10 mg Oral Daily Henreitta Leber, MD   10 mg at 07/14/18 0939  . cyclobenzaprine (FLEXERIL) tablet 10 mg  10 mg Oral TID PRN Henreitta Leber, MD      . diphenhydrAMINE (BENADRYL) capsule 25 mg  25 mg Oral Q8H PRN Henreitta Leber, MD      . docusate sodium (COLACE) capsule 200 mg  200 mg Oral QHS Henreitta Leber, MD   200 mg at 07/13/18 2252  . folic acid (FOLVITE) tablet 1 mg  1 mg Oral QHS Sainani, Vivek  J, MD   1 mg at 07/13/18 2254  . furosemide (LASIX) tablet 80 mg  80 mg Oral Daily Henreitta Leber, MD   80 mg at 07/14/18 3299  . gabapentin (NEURONTIN) capsule 600 mg  600 mg Oral TID Henreitta Leber, MD   600 mg at 07/14/18 2426  . guaiFENesin (MUCINEX) 12 hr tablet 1,200 mg  1,200 mg Oral BID Henreitta Leber, MD   1,200 mg at 07/14/18 0938  . heparin ADULT infusion 100 units/mL (25000 units/281mL sodium chloride 0.45%)  1,200 Units/hr Intravenous Continuous Rowland Lathe, RPH 12 mL/hr at 07/14/18 1131 1,200 Units/hr at 07/14/18 1131  . mirabegron ER (MYRBETRIQ) tablet 50 mg  50 mg Oral Daily Henreitta Leber, MD   50 mg at 07/14/18 0939  . montelukast (SINGULAIR) tablet 10 mg  10 mg Oral QHS Henreitta Leber, MD   10 mg at 07/13/18 2251  . multivitamin with minerals tablet 1 tablet  1 tablet Oral Daily Henreitta Leber, MD   1 tablet at 07/14/18 [phone]  . nortriptyline (PAMELOR) capsule 50 mg  50 mg Oral QHS Henreitta Leber, MD   50 mg at 07/13/18 2258  . ondansetron (ZOFRAN) tablet 4 mg  4 mg Oral Q6H PRN Henreitta Leber, MD       Or  . ondansetron (ZOFRAN) injection 4 mg  4 mg Intravenous Q6H PRN Henreitta Leber, MD      . oxyCODONE-acetaminophen (PERCOCET/ROXICET) 5-325 MG per tablet 1 tablet  1 tablet Oral Q6H PRN Mayer Camel, NP   1 tablet at 07/14/18 0944  . pantoprazole (PROTONIX) EC tablet 40 mg  40 mg Oral BID Henreitta Leber, MD   40 mg at 07/14/18 9622  . potassium chloride SA (K-DUR) CR tablet 20 mEq  20 mEq Oral Q72H Henreitta Leber, MD   20 mEq at 07/13/18 2256  . sacubitril-valsartan (ENTRESTO) 24-26 mg per tablet  1 tablet Oral BID Henreitta Leber, MD   1 tablet at 07/14/18 0939  . sucralfate (CARAFATE) tablet 1 g  1 g Oral BID PRN Henreitta Leber, MD      . umeclidinium-vilanterol (ANORO ELLIPTA) 62.5-25 MCG/INH 1 puff  1 puff Inhalation Daily Henreitta Leber, MD   1 puff at 07/14/18 0939  . vitamin C (ASCORBIC ACID) tablet 500 mg  500 mg Oral Daily Henreitta Leber, MD   500 mg at 07/14/18 2979  . Vitamin D (Ergocalciferol) (DRISDOL) capsule 50,000 Units  50,000 Units Oral Q7 days Henreitta Leber, MD       Past Medical History:  Diagnosis Date  . Allergic rhinitis   . Arthritis   . Asthma    problems with fumes and aerosols cause asthma  . Chest pain 10/19/2016  . Complication of anesthesia    awakens during surgery; has occurred with last 3-4 surgeries   . COPD (chronic obstructive pulmonary disease) ([place])   . Environmental allergies    fumes   . Fibromyalgia   . GERD (gastroesophageal reflux disease)   . Headache   . Hemorrhoids   . History of  bronchitis   . Hx of total knee arthroplasty 12/13/2015  . Hypertension   . Morbid obesity with BMI of 45.0-49.9, adult ([place]) 07/25/2015  . NICM (nonischemic cardiomyopathy) ([place])    a. ? PVC mediated;  b. 06/2016 Echo: EF 25-30%, mild LVH, mild LAE, mild AI/MR/TR/PR; c. 08/2016 Cath: nl cors, EF 25%.  Marland Kitchen  Numbness    hands bilat when driving; improves when not preforming task   . Osteoarthritis of left knee 08/02/2015  . Pes anserinus bursitis of left knee 05/18/2016  . Presence of right artificial knee joint 05/18/2016  . PVC (premature ventricular contraction) 06/04/2017  . PVC's (premature ventricular contractions)    a. 11/2016 [place] started;  b. 11/29/16 48h Holter: 93267 PVC's (41%); c. 12/2016 24h Holter: 18040 PVC's (15%); c. 02/2017 48h Holter: 37262 PVC's (41%).  Marland Kitchen Spinal stenosis of lumbar region 06/19/2015  . Status post gastric banding   . 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 Tinnitus    comes and goes   . Unilateral primary osteoarthritis, right knee 12/13/2015  . Vertigo    6 months ago approx    Past Surgical History:  Procedure Laterality Date  . ABDOMINAL HYSTERECTOMY  1979   left ovary remains  . ABDOMINAL HYSTERECTOMY    . CHOLECYSTECTOMY    . COLONOSCOPY    . fibrous tissue removed from right shoulder and back of neck      2 years ago   . GANGLION CYST EXCISION    . GASTRIC BYPASS  1980   states had bypass with banding and band left in -  . GASTRIC BYPASS OPEN    . JOINT REPLACEMENT    . KNEE SURGERY Bilateral    both knees  2001  . NECK SURGERY N/A 1205/19   ganglion cyst removal, [place]igh   . PVC ABLATION N/A 06/04/2017   Procedure: PVC ABLATION;  Surgeon: Evans Lance, MD;  Location: [facility];  Service: Cardiovascular;  Laterality: N/A;  . RIGHT/LEFT HEART CATH AND CORONARY ANGIOGRAPHY Bilateral 08/12/2016   Procedure: Right/Left Heart Cath and Coronary Angiography;  Surgeon: Yolonda Kida, MD;  Location: [facility];  Service:  Cardiovascular;  Laterality: Bilateral;  . TONSILLECTOMY     age 76  . TOTAL KNEE ARTHROPLASTY Left 08/02/2015   Procedure: LEFT TOTAL KNEE ARTHROPLASTY;  Surgeon: Mcarthur Rossetti, MD;  Location: [facility];  Service: Orthopedics;  Laterality: Left;  . TOTAL KNEE ARTHROPLASTY Right 12/13/2015   Procedure: RIGHT TOTAL KNEE ARTHROPLASTY;  Surgeon: Mcarthur Rossetti, MD;  Location: [facility];  Service: Orthopedics;  Laterality: Right;   Social History Social History   Tobacco Use  . Smoking status: Current Some Day Smoker    Packs/day: 0.10    Years: 50.00    Pack years: 5.00    Last attempt to quit: 08/22/2016    Years since quitting: 1.8  . Smokeless tobacco: Former User    Types: Snuff, Sarina Ser    Quit date: 01/13/1995  Substance Use Topics  . Alcohol use: No    Alcohol/week: 0.0 standard drinks  . Drug use: No   Family History Family History  Problem Relation Age of Onset  . Hypertension Father   . Deep vein thrombosis Father   . Dementia Mother   . Diabetes Sister   . Congestive Heart Failure Sister   . Congestive Heart Failure Brother   . Congestive Heart Failure Daughter   . Congestive Heart Failure Son   . Breast cancer Maternal Aunt        great aunt  Denies family history of peripheral artery disease, venous disease or renal disease.  Allergies  Allergen Reactions  . Hydrocodone-Acetaminophen Swelling  . Iodine Anaphylaxis and Swelling  . Other Other (See Comments)    ALLERGY TO METAL - BLACKENS SKIN AND CAUSES  A RASH   . Tape Swelling and Other (See Comments)    Skin comes off.  Paper tape is ok  . Shellfish Allergy Nausea And Vomiting and Other (See Comments)    Episode of GI infection after eating clam chowder. Still eats shrimp and other seafood   REVIEW OF SYSTEMS (Negative unless checked)  Constitutional: [] Weight loss  [] Fever  [] Chills Cardiac: [] Chest pain   [x] Chest pressure   [] Palpitations   [x] Shortness of breath when laying flat   [x] Shortness  of breath at rest   [x] Shortness of breath with exertion. Vascular:  [] Pain in legs with walking   [] Pain in legs at rest   [] Pain in legs when laying flat   [] Claudication   [] Pain in feet when walking  [] Pain in feet at rest  [] Pain in feet when laying flat   [] History of DVT   [] Phlebitis   [x] Swelling in legs   [] Varicose veins   [] Non-healing ulcers Pulmonary:   [] Uses home oxygen   [] Productive cough   [] Hemoptysis   [] Wheeze  [x] COPD   [] Asthma Neurologic:  [] Dizziness  [] Blackouts   [] Seizures   [] History of stroke   [] History of TIA  [] Aphasia   [] Temporary blindness   [] Dysphagia   [] Weakness or numbness in arms   [] Weakness or numbness in legs Musculoskeletal:  [] Arthritis   [] Joint swelling   [] Joint pain   [] Low back pain Hematologic:  [] Easy bruising  [] Easy bleeding   [] Hypercoagulable state   [] Anemic  [] Hepatitis Gastrointestinal:  [] Blood in stool   [] Vomiting blood  [] Gastroesophageal reflux/heartburn   [] Difficulty swallowing. Genitourinary:  [] Chronic kidney disease   [] Difficult urination  [] Frequent urination  [] Burning with urination   [] Blood in urine Skin:  [] Rashes   [] Ulcers   [] Wounds Psychological:  [] History of anxiety   []  History of major depression.  Physical Examination  Vitals:   07/13/18 2000 07/13/18 2053 07/14/18 0328 07/14/18 0936  BP: 122/87 (!) 160/98 121/81 (!) 132/92  Pulse: 93 100 93 93  Resp: (!) 22 (!) 28 (!) 24 20  Temp:  98 F (36.7 C) 97.7 F (36.5 C)   TempSrc:  Oral Oral   SpO2: 97% 95% 94% 93%  Weight:  130.1 kg    Height:  5\' 7"  (1.702 m)     Body mass index is 44.93 kg/m. Gen:  WD/WN, NAD Head: [place]/AT, No temporalis wasting. Prominent temp pulse not noted. Ear/Nose/Throat: Hearing grossly intact, nares w/o erythema or drainage, oropharynx w/o Erythema/Exudate Eyes: Sclera non-icteric, conjunctiva clear Neck: Trachea midline.  No JVD.  Pulmonary:  Good air movement, respirations not labored, equal bilaterally.  Cardiac: RRR,  normal S1, S2. Vascular:  Vessel Right Left  Radial Palpable Palpable  Ulnar Palpable Palpable  Brachial Palpable Palpable  Carotid Palpable, without bruit Palpable, without bruit  Aorta Not palpable N/A  Femoral Palpable Palpable  Popliteal Palpable Palpable  PT Palpable Palpable  DP Palpable Palpable   Right lower extremity: Thigh soft.  Calf soft.  Extremities warm distally to toes.  Good capillary refill.  Mild to moderate edema.  Mildly tender to palpation.  No pain with dorsiflexion.  Skin is intact.  No erythema cellulitis noted.  Gastrointestinal: soft, non-tender/non-distended. No guarding/reflex.  Musculoskeletal: M/S 5/5 throughout.  Extremities without ischemic changes.  No deformity or atrophy.  Neurologic: Sensation grossly intact in extremities.  Symmetrical.  Speech is fluent. Motor exam as listed above. Psychiatric: Judgment intact, Mood & affect appropriate for pt's clinical situation. Dermatologic: No  rashes or ulcers noted.  No cellulitis or open wounds. Lymph : No Cervical, Axillary, or Inguinal lymphadenopathy.  CBC Lab Results  Component Value Date   WBC 6.3 07/14/2018   HGB 14.0 07/14/2018   HCT 42.4 07/14/2018   MCV 88.0 07/14/2018   PLT 295 07/14/2018   BMET    Component Value Date/Time   NA 142 07/14/2018 0137   NA 146 (H) 09/20/2017 1306   K 3.8 07/14/2018 0137   CL 110 07/14/2018 0137   CO2 23 07/14/2018 0137   GLUCOSE 135 (H) 07/14/2018 0137   BUN 14 07/14/2018 0137   BUN 17 09/20/2017 1306   CREATININE 0.69 07/14/2018 0137   CALCIUM 8.5 (L) 07/14/2018 0137   GFRNONAA >60 07/14/2018 0137   GFRAA >60 07/14/2018 0137   Estimated Creatinine Clearance: 95.9 mL/min (by C-G formula based on SCr of 0.69 mg/dL).  COAG Lab Results  Component Value Date   INR 1.0 07/13/2018   INR 1.11 07/26/2015   Radiology Dg Chest 2 View  Result Date: 07/04/2018 CLINICAL DATA:  Shortness of breath. EXAM: CHEST - 2 VIEW COMPARISON:  10/29/2016.  CT  08/25/2016. FINDINGS: Mediastinum and hilar structures normal. Cardiomegaly with normal pulmonary vascularity. Lungs are clear. Mild left pleural thickening again noted consistent scarring. No pleural effusion or pneumothorax IMPRESSION: 1.  Stable cardiomegaly.  No pulmonary venous congestion. 2. Stable mild left pleural thickening consistent scarring. No acute pulmonary abnormality identified. Electronically Signed   By: Marcello Moores  Register   On: 07/04/2018 12:03   Ct Angio Chest Pe W And/or Wo Contrast  Result Date: 07/13/2018 CLINICAL DATA:  68 year old female with history of increasing shortness of breath for the past week. Increased work of breathing. EXAM: CT ANGIOGRAPHY CHEST WITH CONTRAST TECHNIQUE: Multidetector CT imaging of the chest was performed using the standard protocol during bolus administration of intravenous contrast. Multiplanar CT image reconstructions and MIPs were obtained to evaluate the vascular anatomy. CONTRAST:  2mL OMNIPAQUE IOHEXOL 350 MG/ML SOLN COMPARISON:  Chest CT 08/25/2016. FINDINGS: Cardiovascular: Filling defects are noted throughout the pulmonary arterial tree bilaterally, indicative of massive pulmonary embolus. This includes a small saddle embolus with extensive main, lobar, segmental and subsegmental sized emboli scattered throughout the lungs bilaterally. The majority of these are nonocclusive, however, there are some occlusive emboli, most notably to the right lower lobe. Right ventricle appears dilated relative to the (RV diameter estimated at 66 mm, with LV diameter estimated at 46 mm), concerning for right heart strain. Pulmonic trunk is dilated measuring 4.1 cm in diameter. There is aortic atherosclerosis, as well as atherosclerosis of the great vessels of the mediastinum and the coronary arteries, including calcified atherosclerotic plaque in the left anterior descending and left circumflex coronary arteries. Mediastinum/Nodes: No pathologically enlarged  mediastinal or hilar lymph nodes. Esophagus is unremarkable in appearance. No axillary lymphadenopathy. Lungs/Pleura: Patchy ground-glass attenuation noted in the lungs bilaterally, most evident in the apex of the right upper lobe and in the posterior aspect of the left upper lobe, likely to reflect areas of hemorrhage from pulmonary infarction. No pleural effusions. No suspicious appearing pulmonary nodules or masses. Upper Abdomen: Status post cholecystectomy. Tiny calcified granulomas are noted in the liver. Musculoskeletal: There are no aggressive appearing lytic or blastic lesions noted in the visualized portions of the skeleton. Review of the MIP images confirms the above findings. IMPRESSION: 1. Positive for acute PE with CT evidence of right heart strain (RV/LV Ratio = 1.43) and probable pulmonary arterial hypertension consistent with at  least submassive (intermediate risk) PE. The presence of right heart strain has been associated with an increased risk of morbidity and mortality. 2. Small areas of ground-glass attenuation in the lungs bilaterally, likely to reflect small areas of hemorrhage from pulmonary infarction. 3. Aortic atherosclerosis, in addition to 2 vessel coronary artery disease. Please note that although the presence of coronary artery calcium documents the presence of coronary artery disease, the severity of this disease and any potential stenosis cannot be assessed on this non-gated CT examination. Assessment for potential risk factor modification, dietary therapy or pharmacologic therapy may be warranted, if clinically indicated. Critical Value/emergent results were called by telephone at the time of interpretation on 07/13/2018 at 5:50 pm to Dr. Sherrie George, who verbally acknowledged these results. Aortic Atherosclerosis (ICD10-I70.0). Electronically Signed   By: Vinnie Langton M.D.   On: 07/13/2018 17:54   US Venous Img Lower Bilateral  Result Date: 07/14/2018 CLINICAL DATA:   Bilateral lower extremity pain and edema. History of pulmonary embolism. Evaluate for DVT. EXAM: BILATERAL LOWER EXTREMITY VENOUS DOPPLER ULTRASOUND TECHNIQUE: Gray-scale sonography with graded compression, as well as color Doppler and duplex ultrasound were performed to evaluate the lower extremity deep venous systems from the level of the common femoral vein and including the common femoral, femoral, profunda femoral, popliteal and calf veins including the posterior tibial, peroneal and gastrocnemius veins when visible. The superficial great saphenous vein was also interrogated. Spectral Doppler was utilized to evaluate flow at rest and with distal augmentation maneuvers in the common femoral, femoral and popliteal veins. COMPARISON:  Chest CT-07/13/2018 FINDINGS: RIGHT LOWER EXTREMITY Common Femoral Vein: No evidence of thrombus. Normal compressibility, respiratory phasicity and response to augmentation. Saphenofemoral Junction: No evidence of thrombus. Normal compressibility and flow on color Doppler imaging. Profunda Femoral Vein: No evidence of thrombus. Normal compressibility and flow on color Doppler imaging. Femoral Vein: No evidence of thrombus. Normal compressibility, respiratory phasicity and response to augmentation. Popliteal Vein: There is hypoechoic occlusive expansile thrombus within the right popliteal vein (images 21 through 26). Calf Veins: Appear patent where imaged. Superficial Great Saphenous Vein: No evidence of thrombus. Normal compressibility. Other Findings:  None. LEFT LOWER EXTREMITY Common Femoral Vein: No evidence of thrombus. Normal compressibility, respiratory phasicity and response to augmentation. Saphenofemoral Junction: No evidence of thrombus. Normal compressibility and flow on color Doppler imaging. Profunda Femoral Vein: No evidence of thrombus. Normal compressibility and flow on color Doppler imaging. Femoral Vein: No evidence of thrombus. Normal compressibility, respiratory  phasicity and response to augmentation. Popliteal Vein: No evidence of thrombus. Normal compressibility, respiratory phasicity and response to augmentation. Calf Veins: Appear patent where imaged. Superficial Great Saphenous Vein: No evidence of thrombus. Normal compressibility. Other Findings:  None. IMPRESSION: 1. Examination is positive for occlusive DVT involving the right popliteal vein. 2. No evidence of DVT within the left lower extremity. Electronically Signed   By: Sandi Mariscal M.D.   On: 07/14/2018 09:22   Dg Chest Portable 1 View  Result Date: 07/13/2018 CLINICAL DATA:  Shortness of breath EXAM: PORTABLE CHEST 1 VIEW COMPARISON:  July 04, 2018 FINDINGS: The heart size is enlarged. There is vascular congestion without overt pulmonary edema. There is no pneumothorax. No large pleural effusion. No acute osseous abnormality. There are scattered areas of pleuroparenchymal scarring versus chronic atelectasis bilaterally. Aortic calcifications are noted. IMPRESSION: Cardiomegaly with vascular congestion. Electronically Signed   By: Constance Holster M.D.   On: 07/13/2018 13:54   Assessment/Plan The patient is a 68 year old female with a  past medical history of morbid obesity, nonischemic cardiomyopathy with an EF of 25%, COPD, fibromyalgia, GERD, history of recurrent PVCs, vertigo, osteoarthritis who presented to the hospital with chief complaint of shortness of breath and to have PE/DVT. 1.  Pulmonary embolism: Due to the size of the patient's submassive pulmonary embolism with subsequent right heart strain and progressively worsening symptoms even with the initiation of heparin would recommend a pulmonary lysis in an attempt to decrease the clot burden providing symptomatic relief to the patient as well as decreasing the stress on the heart.  Procedure, risks and benefits explained to the patient.  All questions answered.  The patient wishes to proceed.  We will plan on this procedure this afternoon  with Dr. Lucky Cowboy 2. DVT: No indication for venous lysis at this time.  We will be happy to follow this as an outpatient.  Continue heparin and transition to Eliquis when appropriate.  Encourage elevation. 3. Morbid Obesity: This is followed by the patient's primary care physician.  I encouraged weight loss.  Discussed with Dr. Mayme Genta, PA-C  07/14/2018 1:13 PM  This note was created with Dragon medical transcription system.  Any error is purely unintentional.

## 2018-07-14 NOTE — Care Management Important Message (Signed)
Important Message  Patient Details  Name: Wendy Sanchez MRN: [id number] Date of Birth: 03-Jun-1950   Medicare Important Message Given:  Yes  Initial Medicare IM given by Patient Access Associate on 07/14/2018 at 11:24pm. Still valid.   Dannette Aima 07/14/2018, 2:51 PM

## 2018-07-14 NOTE — Progress Notes (Signed)
[facility] [place] NAME: Nashae Maudlin    MR#:  [id number]  DATE OF BIRTH:  11-Apr-1950  SUBJECTIVE:  CHIEF COMPLAINT:   Chief Complaint  Patient presents with  . Shortness of Breath  Patient seen and evaluated today On oxygen via nasal cannula No complaints of chest pain Has pain in the right leg Has some shortness of breath  REVIEW OF SYSTEMS:    ROS  CONSTITUTIONAL: No documented fever. No fatigue, weakness. No weight gain, no weight loss.  EYES: No blurry or double vision.  ENT: No tinnitus. No postnasal drip. No redness of the oropharynx.  RESPIRATORY: No cough, no wheeze, no hemoptysis. Has dyspnea.  CARDIOVASCULAR: Has chest pain on deep breathing. No orthopnea. No palpitations. No syncope.  GASTROINTESTINAL: No nausea, no vomiting or diarrhea. No abdominal pain. No melena or hematochezia.  GENITOURINARY: No dysuria or hematuria.  ENDOCRINE: No polyuria or nocturia. No heat or cold intolerance.  HEMATOLOGY: No anemia. No bruising. No bleeding.  INTEGUMENTARY: No rashes. No lesions.  MUSCULOSKELETAL: No arthritis. No swelling. No gout. Has right leg pain.  NEUROLOGIC: No numbness, tingling, or ataxia. No seizure-type activity.  PSYCHIATRIC: No anxiety. No insomnia. No ADD.   DRUG ALLERGIES:   Allergies  Allergen Reactions  . Hydrocodone-Acetaminophen Swelling  . Iodine Anaphylaxis and Swelling  . Other Other (See Comments)    ALLERGY TO METAL - BLACKENS SKIN AND CAUSES A RASH   . Tape Swelling and Other (See Comments)    Skin comes off.  Paper tape is ok  . Shellfish Allergy Nausea And Vomiting and Other (See Comments)    Episode of GI infection after eating clam chowder. Still eats shrimp and other seafood    VITALS:  Blood pressure (!) 132/92, pulse 93, temperature 97.7 F (36.5 C), temperature source Oral, resp. rate 20, height 5\' 7"  (1.702 m), weight 130.1 kg, SpO2 93 %.  PHYSICAL EXAMINATION:   Physical  Exam  GENERAL:  68 y.o.-year-old patient lying in the bed with no acute distress.  EYES: Pupils equal, round, reactive to light and accommodation. No scleral icterus. Extraocular muscles intact.  HEENT: Head atraumatic, normocephalic. Oropharynx and nasopharynx clear.  NECK:  Supple, no jugular venous distention. No thyroid enlargement, no tenderness.  LUNGS: Decreased breath sounds bilaterally, no wheezing, rales, rhonchi. No use of accessory muscles of respiration.  CARDIOVASCULAR: S1, S2 normal. No murmurs, rubs, or gallops.  ABDOMEN: Soft, nontender, nondistended. Bowel sounds present. No organomegaly or mass.  EXTREMITIES: No cyanosis, clubbing  Lower extremity edema NEUROLOGIC: Cranial nerves II through XII are intact. No focal Motor or sensory deficits b/l.   PSYCHIATRIC: The patient is alert and oriented x 3.  SKIN: No obvious rash, lesion, or ulcer.   LABORATORY PANEL:   CBC Recent Labs  Lab 07/14/18 0137  WBC 6.3  HGB 14.0  HCT 42.4  PLT 295   ------------------------------------------------------------------------------------------------------------------ Chemistries  Recent Labs  Lab 07/13/18 1337 07/14/18 0137  NA 144 142  K 3.4* 3.8  CL 110 110  CO2 22 23  GLUCOSE 109* 135*  BUN 15 14  CREATININE 0.72 0.69  CALCIUM 8.8* 8.5*  AST 16  --   ALT 13  --   ALKPHOS 70  --   BILITOT 0.7  --    ------------------------------------------------------------------------------------------------------------------  Cardiac Enzymes No results for input(s): TROPONINI in the last 168 hours. ------------------------------------------------------------------------------------------------------------------  RADIOLOGY:  Ct Angio Chest Pe W And/or Wo Contrast  Result Date: 07/13/2018  CLINICAL DATA:  68 year old female with history of increasing shortness of breath for the past week. Increased work of breathing. EXAM: CT ANGIOGRAPHY CHEST WITH CONTRAST TECHNIQUE:  Multidetector CT imaging of the chest was performed using the standard protocol during bolus administration of intravenous contrast. Multiplanar CT image reconstructions and MIPs were obtained to evaluate the vascular anatomy. CONTRAST:  63mL OMNIPAQUE IOHEXOL 350 MG/ML SOLN COMPARISON:  Chest CT 08/25/2016. FINDINGS: Cardiovascular: Filling defects are noted throughout the pulmonary arterial tree bilaterally, indicative of massive pulmonary embolus. This includes a small saddle embolus with extensive main, lobar, segmental and subsegmental sized emboli scattered throughout the lungs bilaterally. The majority of these are nonocclusive, however, there are some occlusive emboli, most notably to the right lower lobe. Right ventricle appears dilated relative to the (RV diameter estimated at 66 mm, with LV diameter estimated at 46 mm), concerning for right heart strain. Pulmonic trunk is dilated measuring 4.1 cm in diameter. There is aortic atherosclerosis, as well as atherosclerosis of the great vessels of the mediastinum and the coronary arteries, including calcified atherosclerotic plaque in the left anterior descending and left circumflex coronary arteries. Mediastinum/Nodes: No pathologically enlarged mediastinal or hilar lymph nodes. Esophagus is unremarkable in appearance. No axillary lymphadenopathy. Lungs/Pleura: Patchy ground-glass attenuation noted in the lungs bilaterally, most evident in the apex of the right upper lobe and in the posterior aspect of the left upper lobe, likely to reflect areas of hemorrhage from pulmonary infarction. No pleural effusions. No suspicious appearing pulmonary nodules or masses. Upper Abdomen: Status post cholecystectomy. Tiny calcified granulomas are noted in the liver. Musculoskeletal: There are no aggressive appearing lytic or blastic lesions noted in the visualized portions of the skeleton. Review of the MIP images confirms the above findings. IMPRESSION: 1. Positive for  acute PE with CT evidence of right heart strain (RV/LV Ratio = 1.43) and probable pulmonary arterial hypertension consistent with at least submassive (intermediate risk) PE. The presence of right heart strain has been associated with an increased risk of morbidity and mortality. 2. Small areas of ground-glass attenuation in the lungs bilaterally, likely to reflect small areas of hemorrhage from pulmonary infarction. 3. Aortic atherosclerosis, in addition to 2 vessel coronary artery disease. Please note that although the presence of coronary artery calcium documents the presence of coronary artery disease, the severity of this disease and any potential stenosis cannot be assessed on this non-gated CT examination. Assessment for potential risk factor modification, dietary therapy or pharmacologic therapy may be warranted, if clinically indicated. Critical Value/emergent results were called by telephone at the time of interpretation on 07/13/2018 at 5:50 pm to Dr. Sherrie George, who verbally acknowledged these results. Aortic Atherosclerosis (ICD10-I70.0). Electronically Signed   By: Vinnie Langton M.D.   On: 07/13/2018 17:54   US Venous Img Lower Bilateral  Result Date: 07/14/2018 CLINICAL DATA:  Bilateral lower extremity pain and edema. History of pulmonary embolism. Evaluate for DVT. EXAM: BILATERAL LOWER EXTREMITY VENOUS DOPPLER ULTRASOUND TECHNIQUE: Gray-scale sonography with graded compression, as well as color Doppler and duplex ultrasound were performed to evaluate the lower extremity deep venous systems from the level of the common femoral vein and including the common femoral, femoral, profunda femoral, popliteal and calf veins including the posterior tibial, peroneal and gastrocnemius veins when visible. The superficial great saphenous vein was also interrogated. Spectral Doppler was utilized to evaluate flow at rest and with distal augmentation maneuvers in the common femoral, femoral and popliteal  veins. COMPARISON:  Chest CT-07/13/2018 FINDINGS: RIGHT LOWER EXTREMITY  Common Femoral Vein: No evidence of thrombus. Normal compressibility, respiratory phasicity and response to augmentation. Saphenofemoral Junction: No evidence of thrombus. Normal compressibility and flow on color Doppler imaging. Profunda Femoral Vein: No evidence of thrombus. Normal compressibility and flow on color Doppler imaging. Femoral Vein: No evidence of thrombus. Normal compressibility, respiratory phasicity and response to augmentation. Popliteal Vein: There is hypoechoic occlusive expansile thrombus within the right popliteal vein (images 21 through 26). Calf Veins: Appear patent where imaged. Superficial Great Saphenous Vein: No evidence of thrombus. Normal compressibility. Other Findings:  None. LEFT LOWER EXTREMITY Common Femoral Vein: No evidence of thrombus. Normal compressibility, respiratory phasicity and response to augmentation. Saphenofemoral Junction: No evidence of thrombus. Normal compressibility and flow on color Doppler imaging. Profunda Femoral Vein: No evidence of thrombus. Normal compressibility and flow on color Doppler imaging. Femoral Vein: No evidence of thrombus. Normal compressibility, respiratory phasicity and response to augmentation. Popliteal Vein: No evidence of thrombus. Normal compressibility, respiratory phasicity and response to augmentation. Calf Veins: Appear patent where imaged. Superficial Great Saphenous Vein: No evidence of thrombus. Normal compressibility. Other Findings:  None. IMPRESSION: 1. Examination is positive for occlusive DVT involving the right popliteal vein. 2. No evidence of DVT within the left lower extremity. Electronically Signed   By: Sandi Mariscal M.D.   On: 07/14/2018 09:22   Dg Chest Portable 1 View  Result Date: 07/13/2018 CLINICAL DATA:  Shortness of breath EXAM: PORTABLE CHEST 1 VIEW COMPARISON:  July 04, 2018 FINDINGS: The heart size is enlarged. There is vascular  congestion without overt pulmonary edema. There is no pneumothorax. No large pleural effusion. No acute osseous abnormality. There are scattered areas of pleuroparenchymal scarring versus chronic atelectasis bilaterally. Aortic calcifications are noted. IMPRESSION: Cardiomegaly with vascular congestion. Electronically Signed   By: Constance Holster M.D.   On: 07/13/2018 13:54     ASSESSMENT AND PLAN:   68 year old female patient with history of obesity, nonischemic cardiomyopathy with EF of 25%, COPD, fibromyalgia, GERD, recurrent PVC, osteoarthritis currently under hospitalist service  -Acute pulmonary embolism Continue IV heparin drip for anticoagulation Vascular surgery consult for possible thrombectomy  -Acute respiratory distress with hypoxia secondary to PE Anticoagulation with heparin drip Oxygen supplementation via nasal cannula  -Elevated troponin secondary to pulmonary embolism From demand ischemia  -Right leg popliteal DVT Anticoagulation with heparin drip  -GERD Continue proton pump inhibitor  All the records are reviewed and case discussed with Care Management/Social Worker. Management plans discussed with the patient, family and they are in agreement.  CODE STATUS: Full code  DVT Prophylaxis: SCDs  TOTAL TIME TAKING CARE OF THIS PATIENT: 45 minutes.   POSSIBLE D/C IN 2 to 3 DAYS, DEPENDING ON CLINICAL CONDITION.  Saundra Shelling M.D on 07/14/2018 at 12:09 PM  Between 7am to 6pm - Pager - [phone]  After 6pm go to [email or web address] - password [email or web address] [facility]  Office  [phone]  CC: Primary care physician; [name], MD  Note: This dictation was prepared with Dragon dictation along with smaller phrase technology. Any transcriptional errors that result from this process are unintentional.

## 2018-07-15 LAB — BASIC METABOLIC PANEL
Anion gap: 8 (ref 5–15)
BUN: 22 mg/dL (ref 8–23)
CO2: 25 mmol/L (ref 22–32)
Calcium: 8.3 mg/dL — ABNORMAL LOW (ref 8.9–10.3)
Chloride: 109 mmol/L (ref 98–111)
Creatinine, Ser: 0.93 mg/dL (ref 0.44–1.00)
GFR calc Af Amer: >60 mL/min (ref >60)
GFR calc non Af Amer: >60 mL/min (ref >60)
Glucose, Bld: 119 mg/dL — ABNORMAL HIGH (ref 70–99)
Potassium: 4.2 mmol/L (ref 3.5–5.1)
Sodium: 142 mmol/L (ref 135–145)

## 2018-07-15 LAB — CBC
HCT: 40 % (ref 36.0–46.0)
Hemoglobin: 12.8 g/dL (ref 12.0–15.0)
MCH: 29.6 pg (ref 26.0–34.0)
MCHC: 32 g/dL (ref 30.0–36.0)
MCV: 92.4 fL (ref 80.0–100.0)
Platelets: 288 10*3/uL (ref 150–400)
RBC: 4.33 MIL/uL (ref 3.87–5.11)
RDW: 14 % (ref 11.5–15.5)
WBC: 10.2 10*3/uL (ref 4.0–10.5)
nRBC: 0 % (ref 0.0–0.2)

## 2018-07-15 LAB — HEPARIN LEVEL (UNFRACTIONATED): Heparin Unfractionated: 0.55 IU/mL (ref 0.30–0.70)

## 2018-07-15 MED ORDER — APIXABAN 5 MG PO TABS: 5.0000 mg | ORAL_TABLET | Freq: Two times a day (BID) | ORAL | Status: DC

## 2018-07-15 MED ORDER — APIXABAN 5 MG PO TABS
10.0000 mg | ORAL_TABLET | Freq: Two times a day (BID) | ORAL | Status: DC
  Administered 2018-07-15 – 2018-07-16 (×3): 10 mg via ORAL
  Filled 2018-07-15 (×3): qty 2

## 2018-07-15 NOTE — Consult Note (Signed)
ANTICOAGULATION CONSULT NOTE  Pharmacy Consult for Heparin Indication: DVT/PE  Patient Measurements: Height: 5\' 7"  (170.2 cm) Weight: 286 lb 14.4 oz (130.1 kg) IBW/kg (Calculated) : 61.6 Heparin Dosing Weight: 92.7 kg  Vital Signs: Temp: 97.5 F (36.4 C) (07/03 0423) Temp Source: Oral (07/03 0423) BP: 98/70 (07/03 0423) Pulse Rate: 77 (07/03 0423)  Labs: Recent Labs    07/13/18 1337 07/13/18 1531 07/13/18 1850  07/14/18 0137 07/14/18 1015 07/14/18 1740 07/14/18 2321 07/15/18 0538  HGB 13.2  --   --   --  14.0  --   --   --  12.8  HCT 40.4  --   --   --  42.4  --   --   --  40.0  PLT 247  --   --   --  295  --   --   --  288  APTT  --   --  <24*  --   --   --   --   --   --   LABPROT  --   --  12.7  --   --   --   --   --   --   INR  --   --  1.0  --   --   --   --   --   --   HEPARINUNFRC  --   --   --    < > 0.56 0.87* 0.55 0.75*  --   CREATININE 0.72  --   --   --  0.69  --   --   --  0.93  TROPONINIHS 113* 147*  --   --   --   --   --   --   --    < > = values in this interval not displayed.    Estimated Creatinine Clearance: 82.5 mL/min (by C-G formula based on SCr of 0.93 mg/dL).   Medical History: Past Medical History:  Diagnosis Date  . Allergic rhinitis   . Arthritis   . Asthma    problems with fumes and aerosols cause asthma  . Chest pain 10/19/2016  . Complication of anesthesia    awakens during surgery; has occurred with last 3-4 surgeries   . COPD (chronic obstructive pulmonary disease) ([place])   . Environmental allergies    fumes   . Fibromyalgia   . GERD (gastroesophageal reflux disease)   . Headache   . Hemorrhoids   . History of bronchitis   . Hx of total knee arthroplasty 12/13/2015  . Hypertension   . Morbid obesity with BMI of 45.0-49.9, adult ([place]) 07/25/2015  . NICM (nonischemic cardiomyopathy) ([place])    a. ? PVC mediated;  b. 06/2016 Echo: EF 25-30%, mild LVH, mild LAE, mild AI/MR/TR/PR; c. 08/2016 Cath: nl cors, EF 25%.  . Numbness     hands bilat when driving; improves when not preforming task   . Osteoarthritis of left knee 08/02/2015  . Pes anserinus bursitis of left knee 05/18/2016  . Presence of right artificial knee joint 05/18/2016  . PVC (premature ventricular contraction) 06/04/2017  . PVC's (premature ventricular contractions)    a. 11/2016 Amio started;  b. 11/29/16 48h Holter: 40102 PVC's (41%); c. 12/2016 24h Holter: 18040 PVC's (15%); c. 02/2017 48h Holter: 37262 PVC's (41%).  Marland Kitchen Spinal stenosis of lumbar region 06/19/2015  . Status post gastric banding   . 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 Tinnitus    comes and goes   .  Unilateral primary osteoarthritis, right knee 12/13/2015  . Vertigo    6 months ago approx     Medications:  Medications Prior to Admission  Medication Sig Dispense Refill Last Dose  . albuterol (PROVENTIL HFA;VENTOLIN HFA) 108 (90 Base) MCG/ACT inhaler Inhale 2 puffs into the lungs every 6 (six) hours as needed for wheezing or shortness of breath.   prn at prn  . aspirin EC 81 MG tablet Take 1 tablet (81 mg total) daily by mouth. 90 tablet 3 07/12/2018 at Unknown time  . azelastine (ASTELIN) 0.1 % nasal spray Place into both nostrils 2 (two) times daily. Use in each nostril as directed   07/12/2018 at prn  . carvedilol (COREG) 6.25 MG tablet Take 1 tablet (6.25 mg total) by mouth 2 (two) times daily. 180 tablet 3 07/13/2018 at 0915  . cyclobenzaprine (FLEXERIL) 10 MG tablet Take 1 tablet (10 mg total) by mouth 3 (three) times daily as needed. for muscle spams 270 tablet 1 prn at prn  . diclofenac sodium (VOLTAREN) 1 % GEL Apply 2 g topically 4 (four) times daily as needed (pain).    prn at prn  . diphenhydrAMINE (BENADRYL) 25 mg capsule Take 25 mg by mouth every 8 (eight) hours as needed for allergies.    prn at prn  . docusate sodium (COLACE) 100 MG capsule Take 200 mg by mouth at bedtime.    07/12/2018 at Unknown time  . ELDERBERRY PO Take 1 tablet by mouth daily.   07/12/2018 at  Unknown time  . folic acid (FOLVITE) 1 MG tablet Take 1 mg by mouth at bedtime.    07/12/2018 at Unknown time  . furosemide (LASIX) 40 MG tablet Take 2 tablets (80 mg) by mouth once daily as directed 180 tablet 3 07/12/2018 at Unknown time  . gabapentin (NEURONTIN) 300 MG capsule Take 2 capsules (600 mg total) by mouth 3 (three) times daily. 540 capsule 0 07/12/2018 at Unknown time  . guaiFENesin (MUCINEX) 600 MG 12 hr tablet Take 1,200 mg by mouth 2 (two) times daily.   07/12/2018 at Unknown time  . ibuprofen (ADVIL) 800 MG tablet Take 1 tablet (800 mg total) by mouth every 8 (eight) hours as needed for moderate pain. 270 tablet 0 prn at prn  . levocetirizine (XYZAL) 5 MG tablet Take 5 mg by mouth at bedtime.    07/12/2018 at Unknown time  . mirabegron ER (MYRBETRIQ) 50 MG TB24 tablet Take 50 mg by mouth daily.   07/12/2018 at Unknown time  . montelukast (SINGULAIR) 10 MG tablet Take 10 mg by mouth at bedtime.   07/12/2018 at Unknown time  . Multiple Vitamins-Minerals (MULTIVITAMIN GUMMIES ADULTS) CHEW Chew 2 tablets by mouth daily.    Past Week at Unknown time  . nortriptyline (PAMELOR) 50 MG capsule Take 50 mg by mouth at bedtime.   07/12/2018 at Unknown time  . nystatin cream (MYCOSTATIN) Apply 1 application topically 2 (two) times daily as needed (irritation).    07/12/2018 at prn  . [START ON 07/20/2018] oxyCODONE-acetaminophen (PERCOCET) 7.5-325 MG tablet Take 1 tablet by mouth every 6 (six) hours as needed for up to 30 days for moderate pain or severe pain. 120 tablet 0 prn at prn  . pantoprazole (PROTONIX) 40 MG tablet Take 40 mg by mouth 2 (two) times daily.   07/12/2018 at Unknown time  . potassium chloride SA (K-DUR,KLOR-CON) 20 MEQ tablet Take 20 mEq by mouth See admin instructions. Take 20 meq every third day  Past Week at Unknown time  . Probiotic Product (PROBIOTIC COLON SUPPORT) CAPS Take 1 capsule by mouth at bedtime.   07/12/2018 at Unknown time  . sacubitril-valsartan (ENTRESTO) 24-26 MG Take  1 tablet by mouth 2 (two) times daily. 180 tablet 3 07/13/2018 at 0915  . sucralfate (CARAFATE) 1 G tablet Take 1 g by mouth 2 (two) times daily as needed (esophageal burning).    prn at prn  . umeclidinium-vilanterol (ANORO ELLIPTA) 62.5-25 MCG/INH AEPB Inhale 1 puff into the lungs daily.   07/12/2018 at Unknown time  . vitamin C (ASCORBIC ACID) 500 MG tablet Take 500 mg by mouth daily.   07/12/2018 at Unknown time  . Vitamin D, Ergocalciferol, (DRISDOL) 50000 UNITS CAPS capsule Take 50,000 Units by mouth every 7 (seven) days. thursdays   Past Week at Unknown time   Scheduled:  . acidophilus  1 capsule Oral QHS  . aspirin EC  81 mg Oral Daily  . carvedilol  6.25 mg Oral BID WC  . cetirizine  10 mg Oral Daily  . docusate sodium  200 mg Oral QHS  . folic acid  1 mg Oral QHS  . furosemide  80 mg Oral Daily  . gabapentin  600 mg Oral TID  . guaiFENesin  1,200 mg Oral BID  . mirabegron ER  50 mg Oral Daily  . montelukast  10 mg Oral QHS  . multivitamin with minerals  1 tablet Oral Daily  . nortriptyline  50 mg Oral QHS  . pantoprazole  40 mg Oral BID  . potassium chloride SA  20 mEq Oral Q72H  . sacubitril-valsartan  1 tablet Oral BID  . umeclidinium-vilanterol  1 puff Inhalation Daily  . vitamin C  500 mg Oral Daily  . Vitamin D (Ergocalciferol)  50,000 Units Oral Q7 days   Infusions:  . heparin 1,050 Units/hr (07/15/18 0500)   PRN:  Anti-infectives (From admission, onward)   Start     Dose/Rate Route Frequency Ordered Stop   07/14/18 1400  ceFAZolin (ANCEF) IVPB 2g/100 mL premix     2 g 200 mL/hr over 30 Minutes Intravenous  Once 07/14/18 1330 07/14/18 1510      Assessment: Pharmacy consulted to initiate Heparin infusion in 68yo patient. Patient has taken no prior anticoagulants and no recent history of bleeding or surgery. She successfully underwent thrombolysis yesterday 7/2. H&H, PLT continue to be wnl  Baseline Labs 7/1: APTT: <24sec, INR: 1.0, Hgb 13.2  Heparin  Course: 07/01 initiation: 5000 unit bolus, then 1400 units/hr 07/02 0130 HL 0.56: continued rate of 1400 units/hr.  07/02 1015 HL 0.87: reduce rate to 1200 units/he 07/02 1740 HL 0.55: rate maintained 07/02 2321 HL 0.75: rate reduced to 1050 units/hr  Goal of Therapy:  Heparin level 0.3-0.7 units/ml Monitor platelets by anticoagulation protocol: Yes   Plan:   Patient is being transitioned to apixaban: start apixaban 10 mg twice daily for 7 days, then 5 mg twice daily thereafter  CBC at least every 3 days while here at [place], PharmD Clinical Pharmacist 07/15/2018 7:21 AM

## 2018-07-15 NOTE — Progress Notes (Signed)
[facility] NAME: Wendy Sanchez    MR#:  [id number]  DATE OF BIRTH:  [birth year]  SUBJECTIVE:  CHIEF COMPLAINT:   Chief Complaint  Patient presents with  . Shortness of Breath  Patient seen and evaluated today On oxygen via nasal cannula No complaints of chest pain Pain in the right leg improved Successfully underwent thrombolysis yesterday  REVIEW OF SYSTEMS:    ROS  CONSTITUTIONAL: No documented fever. No fatigue, weakness. No weight gain, no weight loss.  EYES: No blurry or double vision.  ENT: No tinnitus. No postnasal drip. No redness of the oropharynx.  RESPIRATORY: No cough, no wheeze, no hemoptysis. Has dyspnea.  CARDIOVASCULAR: Has chest pain on deep breathing. No orthopnea. No palpitations. No syncope.  GASTROINTESTINAL: No nausea, no vomiting or diarrhea. No abdominal pain. No melena or hematochezia.  GENITOURINARY: No dysuria or hematuria.  ENDOCRINE: No polyuria or nocturia. No heat or cold intolerance.  HEMATOLOGY: No anemia. No bruising. No bleeding.  INTEGUMENTARY: No rashes. No lesions.  MUSCULOSKELETAL: No arthritis. No swelling. No gout. Has right leg pain.  NEUROLOGIC: No numbness, tingling, or ataxia. No seizure-type activity.  PSYCHIATRIC: No anxiety. No insomnia. No ADD.   DRUG ALLERGIES:   Allergies  Allergen Reactions  . Hydrocodone-Acetaminophen Swelling  . Iodine Anaphylaxis and Swelling  . Other Other (See Comments)    ALLERGY TO METAL - BLACKENS SKIN AND CAUSES A RASH   . Tape Swelling and Other (See Comments)    Skin comes off.  Paper tape is ok  . Shellfish Allergy Nausea And Vomiting and Other (See Comments)    Episode of GI infection after eating clam chowder. Still eats shrimp and other seafood    VITALS:  Blood pressure 93/61, pulse 80, temperature (!) 97.4 F (36.3 C), temperature source Oral, resp. rate 20, height 5\' 7"  (1.702 m), weight 130.1 kg, SpO2 98 %.  PHYSICAL  EXAMINATION:   Physical Exam  GENERAL:  68 y.o.-year-old patient lying in the bed with no acute distress.  EYES: Pupils equal, round, reactive to light and accommodation. No scleral icterus. Extraocular muscles intact.  HEENT: Head atraumatic, normocephalic. Oropharynx and nasopharynx clear.  NECK:  Supple, no jugular venous distention. No thyroid enlargement, no tenderness.  LUNGS: Decreased breath sounds bilaterally, no wheezing, rales, rhonchi. No use of accessory muscles of respiration.  CARDIOVASCULAR: S1, S2 normal. No murmurs, rubs, or gallops.  ABDOMEN: Soft, nontender, nondistended. Bowel sounds present. No organomegaly or mass.  EXTREMITIES: No cyanosis, clubbing  Lower extremity edema NEUROLOGIC: Cranial nerves II through XII are intact. No focal Motor or sensory deficits b/l.   PSYCHIATRIC: The patient is alert and oriented x 3.  SKIN: No obvious rash, lesion, or ulcer.   LABORATORY PANEL:   CBC Recent Labs  Lab 07/15/18 0538  WBC 10.2  HGB 12.8  HCT 40.0  PLT 288   ------------------------------------------------------------------------------------------------------------------ Chemistries  Recent Labs  Lab 07/13/18 1337  07/15/18 0538  NA 144   < > 142  K 3.4*   < > 4.2  CL 110   < > 109  CO2 22   < > 25  GLUCOSE 109*   < > 119*  BUN 15   < > 22  CREATININE 0.72   < > 0.93  CALCIUM 8.8*   < > 8.3*  AST 16  --   --   ALT 13  --   --   ALKPHOS 70  --   --  BILITOT 0.7  --   --    < > = values in this interval not displayed.   ------------------------------------------------------------------------------------------------------------------  Cardiac Enzymes No results for input(s): TROPONINI in the last 168 hours. ------------------------------------------------------------------------------------------------------------------  RADIOLOGY:  Ct Angio Chest Pe W And/or Wo Contrast  Result Date: 07/13/2018 CLINICAL DATA:  68 year old female with history  of increasing shortness of breath for the past week. Increased work of breathing. EXAM: CT ANGIOGRAPHY CHEST WITH CONTRAST TECHNIQUE: Multidetector CT imaging of the chest was performed using the standard protocol during bolus administration of intravenous contrast. Multiplanar CT image reconstructions and MIPs were obtained to evaluate the vascular anatomy. CONTRAST:  92mL OMNIPAQUE IOHEXOL 350 MG/ML SOLN COMPARISON:  Chest CT 08/25/2016. FINDINGS: Cardiovascular: Filling defects are noted throughout the pulmonary arterial tree bilaterally, indicative of massive pulmonary embolus. This includes a small saddle embolus with extensive main, lobar, segmental and subsegmental sized emboli scattered throughout the lungs bilaterally. The majority of these are nonocclusive, however, there are some occlusive emboli, most notably to the right lower lobe. Right ventricle appears dilated relative to the (RV diameter estimated at 66 mm, with LV diameter estimated at 46 mm), concerning for right heart strain. Pulmonic trunk is dilated measuring 4.1 cm in diameter. There is aortic atherosclerosis, as well as atherosclerosis of the great vessels of the mediastinum and the coronary arteries, including calcified atherosclerotic plaque in the left anterior descending and left circumflex coronary arteries. Mediastinum/Nodes: No pathologically enlarged mediastinal or hilar lymph nodes. Esophagus is unremarkable in appearance. No axillary lymphadenopathy. Lungs/Pleura: Patchy ground-glass attenuation noted in the lungs bilaterally, most evident in the apex of the right upper lobe and in the posterior aspect of the left upper lobe, likely to reflect areas of hemorrhage from pulmonary infarction. No pleural effusions. No suspicious appearing pulmonary nodules or masses. Upper Abdomen: Status post cholecystectomy. Tiny calcified granulomas are noted in the liver. Musculoskeletal: There are no aggressive appearing lytic or blastic lesions  noted in the visualized portions of the skeleton. Review of the MIP images confirms the above findings. IMPRESSION: 1. Positive for acute PE with CT evidence of right heart strain (RV/LV Ratio = 1.43) and probable pulmonary arterial hypertension consistent with at least submassive (intermediate risk) PE. The presence of right heart strain has been associated with an increased risk of morbidity and mortality. 2. Small areas of ground-glass attenuation in the lungs bilaterally, likely to reflect small areas of hemorrhage from pulmonary infarction. 3. Aortic atherosclerosis, in addition to 2 vessel coronary artery disease. Please note that although the presence of coronary artery calcium documents the presence of coronary artery disease, the severity of this disease and any potential stenosis cannot be assessed on this non-gated CT examination. Assessment for potential risk factor modification, dietary therapy or pharmacologic therapy may be warranted, if clinically indicated. Critical Value/emergent results were called by telephone at the time of interpretation on 07/13/2018 at 5:50 pm to Dr. Sherrie George, who verbally acknowledged these results. Aortic Atherosclerosis (ICD10-I70.0). Electronically Signed   By: Vinnie Langton M.D.   On: 07/13/2018 17:54   US Venous Img Lower Bilateral  Result Date: 07/14/2018 CLINICAL DATA:  Bilateral lower extremity pain and edema. History of pulmonary embolism. Evaluate for DVT. EXAM: BILATERAL LOWER EXTREMITY VENOUS DOPPLER ULTRASOUND TECHNIQUE: Gray-scale sonography with graded compression, as well as color Doppler and duplex ultrasound were performed to evaluate the lower extremity deep venous systems from the level of the common femoral vein and including the common femoral, femoral, profunda femoral, popliteal and  calf veins including the posterior tibial, peroneal and gastrocnemius veins when visible. The superficial great saphenous vein was also interrogated. Spectral  Doppler was utilized to evaluate flow at rest and with distal augmentation maneuvers in the common femoral, femoral and popliteal veins. COMPARISON:  Chest CT-07/13/2018 FINDINGS: RIGHT LOWER EXTREMITY Common Femoral Vein: No evidence of thrombus. Normal compressibility, respiratory phasicity and response to augmentation. Saphenofemoral Junction: No evidence of thrombus. Normal compressibility and flow on color Doppler imaging. Profunda Femoral Vein: No evidence of thrombus. Normal compressibility and flow on color Doppler imaging. Femoral Vein: No evidence of thrombus. Normal compressibility, respiratory phasicity and response to augmentation. Popliteal Vein: There is hypoechoic occlusive expansile thrombus within the right popliteal vein (images 21 through 26). Calf Veins: Appear patent where imaged. Superficial Great Saphenous Vein: No evidence of thrombus. Normal compressibility. Other Findings:  None. LEFT LOWER EXTREMITY Common Femoral Vein: No evidence of thrombus. Normal compressibility, respiratory phasicity and response to augmentation. Saphenofemoral Junction: No evidence of thrombus. Normal compressibility and flow on color Doppler imaging. Profunda Femoral Vein: No evidence of thrombus. Normal compressibility and flow on color Doppler imaging. Femoral Vein: No evidence of thrombus. Normal compressibility, respiratory phasicity and response to augmentation. Popliteal Vein: No evidence of thrombus. Normal compressibility, respiratory phasicity and response to augmentation. Calf Veins: Appear patent where imaged. Superficial Great Saphenous Vein: No evidence of thrombus. Normal compressibility. Other Findings:  None. IMPRESSION: 1. Examination is positive for occlusive DVT involving the right popliteal vein. 2. No evidence of DVT within the left lower extremity. Electronically Signed   By: Sandi Mariscal M.D.   On: 07/14/2018 09:22   Dg Chest Portable 1 View  Result Date: 07/13/2018 CLINICAL DATA:   Shortness of breath EXAM: PORTABLE CHEST 1 VIEW COMPARISON:  July 04, 2018 FINDINGS: The heart size is enlarged. There is vascular congestion without overt pulmonary edema. There is no pneumothorax. No large pleural effusion. No acute osseous abnormality. There are scattered areas of pleuroparenchymal scarring versus chronic atelectasis bilaterally. Aortic calcifications are noted. IMPRESSION: Cardiomegaly with vascular congestion. Electronically Signed   By: Constance Holster M.D.   On: 07/13/2018 13:54     ASSESSMENT AND PLAN:   68 year old female patient with history of obesity, nonischemic cardiomyopathy with EF of 25%, COPD, fibromyalgia, GERD, recurrent PVC, osteoarthritis currently under hospitalist service  -Acute pulmonary embolism Status post thrombolysis Transition patient to oral Eliquis for anticoagulation Pharmacy consult  -Acute respiratory distress with hypoxia secondary to PE Anticoagulation with heparin drip discontinued Started on oral Eliquis Wean oxygen  -Elevated troponin secondary to pulmonary embolism From demand ischemia  -Right leg popliteal DVT Anticoagulation with oral Eliquis  -GERD Continue proton pump inhibitor  All the records are reviewed and case discussed with Care Management/Social Worker. Management plans discussed with the patient, family and they are in agreement.  CODE STATUS: Full code  DVT Prophylaxis: SCDs  TOTAL TIME TAKING CARE OF THIS PATIENT: 35 minutes.   POSSIBLE D/C IN 1 DAYS, DEPENDING ON CLINICAL CONDITION.  Saundra Shelling M.D on 07/15/2018 at 12:11 PM  Between 7am to 6pm - Pager - [phone]  After 6pm go to [email or web address] - password [email or web address] [facility]  Office  [phone]  CC: Primary care physician; [name], MD  Note: This dictation was prepared with Dragon dictation along with smaller phrase technology. Any transcriptional errors that result from this process are unintentional.

## 2018-07-15 NOTE — Consult Note (Signed)
ANTICOAGULATION CONSULT NOTE - Initial Consult  Pharmacy Consult for Heparin Indication: DVT  Allergies  Allergen Reactions  . Hydrocodone-Acetaminophen Swelling  . Iodine Anaphylaxis and Swelling  . Other Other (See Comments)    ALLERGY TO METAL - BLACKENS SKIN AND CAUSES A RASH   . Tape Swelling and Other (See Comments)    Skin comes off.  Paper tape is ok  . Shellfish Allergy Nausea And Vomiting and Other (See Comments)    Episode of GI infection after eating clam chowder. Still eats shrimp and other seafood    Patient Measurements: Height: 5\' 7"  (170.2 cm) Weight: 286 lb 14.4 oz (130.1 kg) IBW/kg (Calculated) : 61.6 Heparin Dosing Weight: 92.7 kg  Vital Signs: Temp: 97.8 F (36.6 C) (07/02 2005) Temp Source: Oral (07/02 2005) BP: 114/83 (07/02 2005) Pulse Rate: 81 (07/02 2005)  Labs: Recent Labs    07/13/18 1337 07/13/18 1531 07/13/18 1850  07/14/18 0137 07/14/18 1015 07/14/18 1740 07/14/18 2321  HGB 13.2  --   --   --  14.0  --   --   --   HCT 40.4  --   --   --  42.4  --   --   --   PLT 247  --   --   --  295  --   --   --   APTT  --   --  <24*  --   --   --   --   --   LABPROT  --   --  12.7  --   --   --   --   --   INR  --   --  1.0  --   --   --   --   --   HEPARINUNFRC  --   --   --    < > 0.56 0.87* 0.55 0.75*  CREATININE 0.72  --   --   --  0.69  --   --   --   TROPONINIHS 113* 147*  --   --   --   --   --   --    < > = values in this interval not displayed.    Estimated Creatinine Clearance: 95.9 mL/min (by C-G formula based on SCr of 0.69 mg/dL).   Medical History: Past Medical History:  Diagnosis Date  . Allergic rhinitis   . Arthritis   . Asthma    problems with fumes and aerosols cause asthma  . Chest pain 10/19/2016  . Complication of anesthesia    awakens during surgery; has occurred with last 3-4 surgeries   . COPD (chronic obstructive pulmonary disease) ([place])   . Environmental allergies    fumes   . Fibromyalgia   . GERD  (gastroesophageal reflux disease)   . Headache   . Hemorrhoids   . History of bronchitis   . Hx of total knee arthroplasty 12/13/2015  . Hypertension   . Morbid obesity with BMI of 45.0-49.9, adult ([place]) 07/25/2015  . NICM (nonischemic cardiomyopathy) ([place])    a. ? PVC mediated;  b. 06/2016 Echo: EF 25-30%, mild LVH, mild LAE, mild AI/MR/TR/PR; c. 08/2016 Cath: nl cors, EF 25%.  . Numbness    hands bilat when driving; improves when not preforming task   . Osteoarthritis of left knee 08/02/2015  . Pes anserinus bursitis of left knee 05/18/2016  . Presence of right artificial knee joint 05/18/2016  . PVC (premature ventricular contraction) 06/04/2017  .  PVC's (premature ventricular contractions)    a. 11/2016 Amio started;  b. 11/29/16 48h Holter: 93267 PVC's (41%); c. 12/2016 24h Holter: 18040 PVC's (15%); c. 02/2017 48h Holter: 37262 PVC's (41%).  Marland Kitchen Spinal stenosis of lumbar region 06/19/2015  . Status post gastric banding   . 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 Tinnitus    comes and goes   . Unilateral primary osteoarthritis, right knee 12/13/2015  . Vertigo    6 months ago approx     Medications:  Medications Prior to Admission  Medication Sig Dispense Refill Last Dose  . albuterol (PROVENTIL HFA;VENTOLIN HFA) 108 (90 Base) MCG/ACT inhaler Inhale 2 puffs into the lungs every 6 (six) hours as needed for wheezing or shortness of breath.   prn at prn  . aspirin EC 81 MG tablet Take 1 tablet (81 mg total) daily by mouth. 90 tablet 3 07/12/2018 at Unknown time  . azelastine (ASTELIN) 0.1 % nasal spray Place into both nostrils 2 (two) times daily. Use in each nostril as directed   07/12/2018 at prn  . carvedilol (COREG) 6.25 MG tablet Take 1 tablet (6.25 mg total) by mouth 2 (two) times daily. 180 tablet 3 07/13/2018 at 0915  . cyclobenzaprine (FLEXERIL) 10 MG tablet Take 1 tablet (10 mg total) by mouth 3 (three) times daily as needed. for muscle spams 270 tablet 1 prn at prn  .  diclofenac sodium (VOLTAREN) 1 % GEL Apply 2 g topically 4 (four) times daily as needed (pain).    prn at prn  . diphenhydrAMINE (BENADRYL) 25 mg capsule Take 25 mg by mouth every 8 (eight) hours as needed for allergies.    prn at prn  . docusate sodium (COLACE) 100 MG capsule Take 200 mg by mouth at bedtime.    07/12/2018 at Unknown time  . ELDERBERRY PO Take 1 tablet by mouth daily.   07/12/2018 at Unknown time  . folic acid (FOLVITE) 1 MG tablet Take 1 mg by mouth at bedtime.    07/12/2018 at Unknown time  . furosemide (LASIX) 40 MG tablet Take 2 tablets (80 mg) by mouth once daily as directed 180 tablet 3 07/12/2018 at Unknown time  . gabapentin (NEURONTIN) 300 MG capsule Take 2 capsules (600 mg total) by mouth 3 (three) times daily. 540 capsule 0 07/12/2018 at Unknown time  . guaiFENesin (MUCINEX) 600 MG 12 hr tablet Take 1,200 mg by mouth 2 (two) times daily.   07/12/2018 at Unknown time  . ibuprofen (ADVIL) 800 MG tablet Take 1 tablet (800 mg total) by mouth every 8 (eight) hours as needed for moderate pain. 270 tablet 0 prn at prn  . levocetirizine (XYZAL) 5 MG tablet Take 5 mg by mouth at bedtime.    07/12/2018 at Unknown time  . mirabegron ER (MYRBETRIQ) 50 MG TB24 tablet Take 50 mg by mouth daily.   07/12/2018 at Unknown time  . montelukast (SINGULAIR) 10 MG tablet Take 10 mg by mouth at bedtime.   07/12/2018 at Unknown time  . Multiple Vitamins-Minerals (MULTIVITAMIN GUMMIES ADULTS) CHEW Chew 2 tablets by mouth daily.    Past Week at Unknown time  . nortriptyline (PAMELOR) 50 MG capsule Take 50 mg by mouth at bedtime.   07/12/2018 at Unknown time  . nystatin cream (MYCOSTATIN) Apply 1 application topically 2 (two) times daily as needed (irritation).    07/12/2018 at prn  . [START ON 07/20/2018] oxyCODONE-acetaminophen (PERCOCET) 7.5-325 MG tablet Take 1 tablet by mouth every  6 (six) hours as needed for up to 30 days for moderate pain or severe pain. 120 tablet 0 prn at prn  . pantoprazole (PROTONIX)  40 MG tablet Take 40 mg by mouth 2 (two) times daily.   07/12/2018 at Unknown time  . potassium chloride SA (K-DUR,KLOR-CON) 20 MEQ tablet Take 20 mEq by mouth See admin instructions. Take 20 meq every third day   Past Week at Unknown time  . Probiotic Product (PROBIOTIC COLON SUPPORT) CAPS Take 1 capsule by mouth at bedtime.   07/12/2018 at Unknown time  . sacubitril-valsartan (ENTRESTO) 24-26 MG Take 1 tablet by mouth 2 (two) times daily. 180 tablet 3 07/13/2018 at 0915  . sucralfate (CARAFATE) 1 G tablet Take 1 g by mouth 2 (two) times daily as needed (esophageal burning).    prn at prn  . umeclidinium-vilanterol (ANORO ELLIPTA) 62.5-25 MCG/INH AEPB Inhale 1 puff into the lungs daily.   07/12/2018 at Unknown time  . vitamin C (ASCORBIC ACID) 500 MG tablet Take 500 mg by mouth daily.   07/12/2018 at Unknown time  . Vitamin D, Ergocalciferol, (DRISDOL) 50000 UNITS CAPS capsule Take 50,000 Units by mouth every 7 (seven) days. thursdays   Past Week at Unknown time   Scheduled:  . acidophilus  1 capsule Oral QHS  . aspirin EC  81 mg Oral Daily  . carvedilol  6.25 mg Oral BID WC  . cetirizine  10 mg Oral Daily  . docusate sodium  200 mg Oral QHS  . famotidine      . fentaNYL      . folic acid  1 mg Oral QHS  . furosemide  80 mg Oral Daily  . gabapentin  600 mg Oral TID  . guaiFENesin  1,200 mg Oral BID  . heparin      . lidocaine-EPINEPHrine      . methylPREDNISolone sodium succinate      . midazolam      . mirabegron ER  50 mg Oral Daily  . montelukast  10 mg Oral QHS  . multivitamin with minerals  1 tablet Oral Daily  . nortriptyline  50 mg Oral QHS  . pantoprazole  40 mg Oral BID  . potassium chloride SA  20 mEq Oral Q72H  . sacubitril-valsartan  1 tablet Oral BID  . umeclidinium-vilanterol  1 puff Inhalation Daily  . vitamin C  500 mg Oral Daily  . Vitamin D (Ergocalciferol)  50,000 Units Oral Q7 days   Infusions:  . heparin 1,200 Units/hr (07/14/18 2350)   PRN:  Anti-infectives  (From admission, onward)   Start     Dose/Rate Route Frequency Ordered Stop   07/14/18 1400  ceFAZolin (ANCEF) IVPB 2g/100 mL premix     2 g 200 mL/hr over 30 Minutes Intravenous  Once 07/14/18 1330 07/14/18 1510      Assessment: Pharmacy consulted to initiate Heparin infusion in 68yo patient. Patient has taken no prior anticoagulants and no recent history of bleeding or surgery. Will initiate therapy.   Baseline Labs 7/1: APTT: <24sec, INR: 1.0, Hgb 13.2  07/02 @ 0130 HL 0.56 therapeutic. Continued rate of 1400 units/hr.  07/02 @ 1015 HL 0.87 supratherapeutic. Will need to reduce rate by 2 units/kg/hr. 07/02 @ 1740 HL 0.55 therapeutic  Goal of Therapy:  Heparin level 0.3-0.7 units/ml Monitor platelets by anticoagulation protocol: Yes   Plan:  07/02 2321 HL 0.75 supratherapeutic. Will redose rate to 1050 units/hr and will recheck HL @ 0830, CBC stable, will recheck  w/ am labs.   Tobie Lords, PharmD Clinical Pharmacist 07/15/2018 12:38 AM

## 2018-07-16 LAB — CBC
HCT: 36 % (ref 36.0–46.0)
Hemoglobin: 11.6 g/dL — ABNORMAL LOW (ref 12.0–15.0)
MCH: 29.4 pg (ref 26.0–34.0)
MCHC: 32.2 g/dL (ref 30.0–36.0)
MCV: 91.1 fL (ref 80.0–100.0)
Platelets: 254 10*3/uL (ref 150–400)
RBC: 3.95 MIL/uL (ref 3.87–5.11)
RDW: 14.1 % (ref 11.5–15.5)
WBC: 7.9 10*3/uL (ref 4.0–10.5)
nRBC: 0 % (ref 0.0–0.2)

## 2018-07-16 LAB — HIV ANTIBODY (ROUTINE TESTING W REFLEX): HIV Screen 4th Generation wRfx: NONREACTIVE

## 2018-07-16 MED ORDER — APIXABAN 5 MG PO TABS: 5.0000 mg | ORAL_TABLET | Freq: Two times a day (BID) | ORAL | 0 refills | Status: AC

## 2018-07-16 MED ORDER — APIXABAN 5 MG PO TABS: 10.0000 mg | ORAL_TABLET | Freq: Two times a day (BID) | ORAL | 0 refills | Status: DC

## 2018-07-16 NOTE — Discharge Summary (Signed)
[facility] NAME: Wendy Sanchez    MR#:  [id number]  DATE OF BIRTH:  1950/09/23  DATE OF ADMISSION:  07/13/2018 ADMITTING PHYSICIAN: Henreitta Leber, MD  DATE OF DISCHARGE: 07/16/2018  PRIMARY CARE PHYSICIAN: [name], MD   ADMISSION DIAGNOSIS:  Other acute pulmonary embolism with acute cor pulmonale (HCC) [I26.09]  DISCHARGE DIAGNOSIS:  Active Problems:   Pulmonary embolism (HCC) Popliteal vein DVT COPD Nonischemic cardiomyopathy  SECONDARY DIAGNOSIS:   Past Medical History:  Diagnosis Date  . Allergic rhinitis   . Arthritis   . Asthma    problems with fumes and aerosols cause asthma  . Chest pain 10/19/2016  . Complication of anesthesia    awakens during surgery; has occurred with last 3-4 surgeries   . COPD (chronic obstructive pulmonary disease) ([place])   . Environmental allergies    fumes   . Fibromyalgia   . GERD (gastroesophageal reflux disease)   . Headache   . Hemorrhoids   . History of bronchitis   . Hx of total knee arthroplasty 12/13/2015  . Hypertension   . Morbid obesity with BMI of 45.0-49.9, adult ([place]) 07/25/2015  . NICM (nonischemic cardiomyopathy) ([place])    a. ? PVC mediated;  b. 06/2016 Echo: EF 25-30%, mild LVH, mild LAE, mild AI/MR/TR/PR; c. 08/2016 Cath: nl cors, EF 25%.  . Numbness    hands bilat when driving; improves when not preforming task   . Osteoarthritis of left knee 08/02/2015  . Pes anserinus bursitis of left knee 05/18/2016  . Presence of right artificial knee joint 05/18/2016  . PVC (premature ventricular contraction) 06/04/2017  . PVC's (premature ventricular contractions)    a. 11/2016 Amio started;  b. 11/29/16 48h Holter: 96283 PVC's (41%); c. 12/2016 24h Holter: 18040 PVC's (15%); c. 02/2017 48h Holter: 37262 PVC's (41%).  Marland Kitchen Spinal stenosis of lumbar region 06/19/2015  . Status post gastric banding   . 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 Tinnitus    comes and goes    . Unilateral primary osteoarthritis, right knee 12/13/2015  . Vertigo    6 months ago approx      ADMITTING HISTORY Wendy Sanchez  is a 68 y.o. female with a known history of morbid obesity, nonischemic cardiomyopathy EF of 25%, COPD, fibromyalgia, GERD, history of recurrent PVCs, vertigo, osteoarthritis who presented to the hospital due to shortness of breath.  Patient says she has been having worsening shortness of breath over the past few days to week progressively getting worse.  She went to urgent care a few days ago and she had x-rays and blood work done and she was treated for suspected bronchitis and placed on prednisone.  She has not improved and therefore comes to the ER for further evaluation.  In the emergency room patient was noted to be in acute respiratory failure with hypoxia with O2 sats in the low to mid 80s.  Patient underwent a CT of the chest which was positive for acute pulmonary embolism.  Hospitalist services were contacted for admission.  Patient does say that she has noticed that she has had some cramping in her calves and both of her legs about a week to 10 days ago.  Does complain of some swelling in her lower extremities bilaterally over the past few days.  She denies any cough, fever, chills, chest pains, nausea vomiting or any other associated symptoms.  HOSPITAL COURSE:  Patient was admitted to medical floor  initially started on heparin drip for anticoagulation.  Patient was seen by vascular surgery for pulmonary embolism and clot lysis.  Patient underwent clot lysis and successfully tolerated the procedure.  Patient was worked up with venous Doppler ultrasound of lower extremities which showed lower extremity popliteal DVT.  Patient was transitioned to oral Eliquis for anticoagulation.  Patient's lower extremity pain and swelling decreased.  Shortness of breath improved and she was weaned off oxygen.  Chest discomfort also resolved.  Patient will be discharged home on oral  Eliquis for anticoagulation.  CONSULTS OBTAINED:    DRUG ALLERGIES:   Allergies  Allergen Reactions  . Hydrocodone-Acetaminophen Swelling  . Iodine Anaphylaxis and Swelling  . Other Other (See Comments)    ALLERGY TO METAL - BLACKENS SKIN AND CAUSES A RASH   . Tape Swelling and Other (See Comments)    Skin comes off.  Paper tape is ok  . Shellfish Allergy Nausea And Vomiting and Other (See Comments)    Episode of GI infection after eating clam chowder. Still eats shrimp and other seafood    DISCHARGE MEDICATIONS:   Allergies as of 07/16/2018      Reactions   Hydrocodone-acetaminophen Swelling   Iodine Anaphylaxis, Swelling   Other Other (See Comments)   ALLERGY TO METAL - BLACKENS SKIN AND CAUSES A RASH    Tape Swelling, Other (See Comments)   Skin comes off.  Paper tape is ok   Shellfish Allergy Nausea And Vomiting, Other (See Comments)   Episode of GI infection after eating clam chowder. Still eats shrimp and other seafood      Medication List    STOP taking these medications   ibuprofen 800 MG tablet Commonly known as: ADVIL     TAKE these medications   albuterol 108 (90 Base) MCG/ACT inhaler Commonly known as: VENTOLIN HFA Inhale 2 puffs into the lungs every 6 (six) hours as needed for wheezing or shortness of breath.   apixaban 5 MG Tabs tablet Commonly known as: ELIQUIS Take 2 tablets (10 mg total) by mouth 2 (two) times daily for 6 days.   apixaban 5 MG Tabs tablet Commonly known as: ELIQUIS Take 1 tablet (5 mg total) by mouth 2 (two) times daily. Start taking on: July 22, 2018   aspirin EC 81 MG tablet Take 1 tablet (81 mg total) daily by mouth.   azelastine 0.1 % nasal spray Commonly known as: ASTELIN Place into both nostrils 2 (two) times daily. Use in each nostril as directed   carvedilol 6.25 MG tablet Commonly known as: COREG Take 1 tablet (6.25 mg total) by mouth 2 (two) times daily.   cyclobenzaprine 10 MG tablet Commonly known as:  FLEXERIL Take 1 tablet (10 mg total) by mouth 3 (three) times daily as needed. for muscle spams   diclofenac sodium 1 % Gel Commonly known as: VOLTAREN Apply 2 g topically 4 (four) times daily as needed (pain).   diphenhydrAMINE 25 mg capsule Commonly known as: BENADRYL Take 25 mg by mouth every 8 (eight) hours as needed for allergies.   docusate sodium 100 MG capsule Commonly known as: COLACE Take 200 mg by mouth at bedtime.   ELDERBERRY PO Take 1 tablet by mouth daily.   folic acid 1 MG tablet Commonly known as: FOLVITE Take 1 mg by mouth at bedtime.   furosemide 40 MG tablet Commonly known as: LASIX Take 2 tablets (80 mg) by mouth once daily as directed   gabapentin 300 MG capsule Commonly known  as: NEURONTIN Take 2 capsules (600 mg total) by mouth 3 (three) times daily.   guaiFENesin 600 MG 12 hr tablet Commonly known as: MUCINEX Take 1,200 mg by mouth 2 (two) times daily.   levocetirizine 5 MG tablet Commonly known as: XYZAL Take 5 mg by mouth at bedtime.   mirabegron ER 50 MG Tb24 tablet Commonly known as: MYRBETRIQ Take 50 mg by mouth daily.   montelukast 10 MG tablet Commonly known as: SINGULAIR Take 10 mg by mouth at bedtime.   Multivitamin Gummies Adults Chew Chew 2 tablets by mouth daily.   nortriptyline 50 MG capsule Commonly known as: PAMELOR Take 50 mg by mouth at bedtime.   nystatin cream Commonly known as: MYCOSTATIN Apply 1 application topically 2 (two) times daily as needed (irritation).   oxyCODONE-acetaminophen 7.5-325 MG tablet Commonly known as: Percocet Take 1 tablet by mouth every 6 (six) hours as needed for up to 30 days for moderate pain or severe pain. Start taking on: July 20, 2018   pantoprazole 40 MG tablet Commonly known as: PROTONIX Take 40 mg by mouth 2 (two) times daily.   potassium chloride SA 20 MEQ tablet Commonly known as: K-DUR Take 20 mEq by mouth See admin instructions. Take 20 meq every third day    Probiotic Colon Support Caps Take 1 capsule by mouth at bedtime.   sacubitril-valsartan 24-26 MG Commonly known as: Entresto Take 1 tablet by mouth 2 (two) times daily.   sucralfate 1 g tablet Commonly known as: CARAFATE Take 1 g by mouth 2 (two) times daily as needed (esophageal burning).   umeclidinium-vilanterol 62.5-25 MCG/INH Aepb Commonly known as: ANORO ELLIPTA Inhale 1 puff into the lungs daily.   vitamin C 500 MG tablet Commonly known as: ASCORBIC ACID Take 500 mg by mouth daily.   Vitamin D (Ergocalciferol) 1.25 MG (50000 UT) Caps capsule Commonly known as: DRISDOL Take 50,000 Units by mouth every 7 (seven) days. thursdays       Today  Patient seen today No chest pain No shortness of breath Hemodynamically stable  VITAL SIGNS:  Blood pressure 103/65, pulse 76, temperature 97.7 F (36.5 C), temperature source Oral, resp. rate 20, height 5\' 7"  (1.702 m), weight 130.1 kg, SpO2 98 %.  I/O:    Intake/Output Summary (Last 24 hours) at 07/16/2018 1003 Last data filed at 07/15/2018 1912 Gross per 24 hour  Intake 1200 ml  Output -  Net 1200 ml    PHYSICAL EXAMINATION:  Physical Exam  GENERAL:  68 y.o.-year-old patient lying in the bed with no acute distress.  LUNGS: Normal breath sounds bilaterally, no wheezing, rales,rhonchi or crepitation. No use of accessory muscles of respiration.  CARDIOVASCULAR: S1, S2 normal. No murmurs, rubs, or gallops.  ABDOMEN: Soft, non-tender, non-distended. Bowel sounds present. No organomegaly or mass.  NEUROLOGIC: Moves all 4 extremities. PSYCHIATRIC: The patient is alert and oriented x 3.  SKIN: No obvious rash, lesion, or ulcer.   DATA REVIEW:   CBC Recent Labs  Lab 07/16/18 0439  WBC 7.9  HGB 11.6*  HCT 36.0  PLT 254    Chemistries  Recent Labs  Lab 07/13/18 1337  07/15/18 0538  NA 144   < > 142  K 3.4*   < > 4.2  CL 110   < > 109  CO2 22   < > 25  GLUCOSE 109*   < > 119*  BUN 15   < > 22  CREATININE  0.72   < > 0.93  CALCIUM 8.8*   < > 8.3*  AST 16  --   --   ALT 13  --   --   ALKPHOS 70  --   --   BILITOT 0.7  --   --    < > = values in this interval not displayed.    Cardiac Enzymes No results for input(s): TROPONINI in the last 168 hours.  Microbiology Results  Results for orders placed or performed during the hospital encounter of 07/13/18  SARS Coronavirus 2 (CEPHEID- Performed in [facility] hospital lab), Hosp Order     Status: None   Collection Time: 07/13/18  1:37 PM   Specimen: Nasopharyngeal Swab  Result Value Ref Range Status   SARS Coronavirus 2 NEGATIVE NEGATIVE Final    Comment: (NOTE) If result is NEGATIVE SARS-CoV-2 target nucleic acids are NOT DETECTED. The SARS-CoV-2 RNA is generally detectable in upper and lower  respiratory specimens during the acute phase of infection. The lowest  concentration of SARS-CoV-2 viral copies this assay can detect is 250  copies / mL. A negative result does not preclude SARS-CoV-2 infection  and should not be used as the sole basis for treatment or other  patient management decisions.  A negative result may occur with  improper specimen collection / handling, submission of specimen other  than nasopharyngeal swab, presence of viral mutation(s) within the  areas targeted by this assay, and inadequate number of viral copies  (<250 copies / mL). A negative result must be combined with clinical  observations, patient history, and epidemiological information. If result is POSITIVE SARS-CoV-2 target nucleic acids are DETECTED. The SARS-CoV-2 RNA is generally detectable in upper and lower  respiratory specimens dur ing the acute phase of infection.  Positive  results are indicative of active infection with SARS-CoV-2.  Clinical  correlation with patient history and other diagnostic information is  necessary to determine patient infection status.  Positive results do  not rule out bacterial infection or co-infection with other  viruses. If result is PRESUMPTIVE POSTIVE SARS-CoV-2 nucleic acids MAY BE PRESENT.   A presumptive positive result was obtained on the submitted specimen  and confirmed on repeat testing.  While 2019 novel coronavirus  (SARS-CoV-2) nucleic acids may be present in the submitted sample  additional confirmatory testing may be necessary for epidemiological  and / or clinical management purposes  to differentiate between  SARS-CoV-2 and other Sarbecovirus currently known to infect humans.  If clinically indicated additional testing with an alternate test  methodology [phone]) is advised. The SARS-CoV-2 RNA is generally  detectable in upper and lower respiratory sp ecimens during the acute  phase of infection. The expected result is Negative. Fact Sheet for Patients:  StrictlyIdeas.no Fact Sheet for Healthcare Providers: BankingDealers.co.za This test is not yet approved or cleared by the [place] FDA and has been authorized for detection and/or diagnosis of SARS-CoV-2 by FDA under an Emergency Use Authorization (EUA).  This EUA will remain in effect (meaning this test can be used) for the duration of the COVID-19 declaration under Section 564(b)(1) of the Act, 21 U.S.C. section 360bbb-3(b)(1), unless the authorization is terminated or revoked sooner. Performed at [facility], [address] [place], [place] [address]     RADIOLOGY:  No results found.  Follow up with PCP in 1 week.  Management plans discussed with the patient, family and they are in agreement.  CODE STATUS: Full code    Code Status Orders  (From admission, onward)  Start     Ordered   07/13/18 2042  Full code  Continuous     07/13/18 2042        Code Status History    Date Active Date Inactive Code Status Order ID Comments User Context   06/04/2017 1414 06/04/2017 2227 Full Code [id number]  Evans Lance, MD Inpatient   10/19/2016  1308 10/20/2016 1507 Full Code [id number]  Demetrios Loll, MD Inpatient   08/12/2016 1406 08/12/2016 1805 Full Code [id number]  Yolonda Kida, MD Inpatient   12/13/2015 1200 12/16/2015 1602 Full Code [id number]  Mcarthur Rossetti, MD Inpatient   08/02/2015 1532 08/06/2015 1716 Full Code [id number]  Mcarthur Rossetti, MD Inpatient   Advance Care Planning Activity      TOTAL TIME TAKING CARE OF THIS PATIENT ON DAY OF DISCHARGE: more than 35 minutes.   Saundra Shelling M.D on 07/16/2018 at 10:03 AM  Between 7am to 6pm - Pager - [phone]  After 6pm go to [email or web address] - password [email or web address] [place] [facility]  Office  [phone]  CC: Primary care physician; [name], MD  Note: This dictation was prepared with Dragon dictation along with smaller phrase technology. Any transcriptional errors that result from this process are unintentional.

## 2018-07-17 ENCOUNTER — Other Ambulatory Visit: Payer: Self-pay | Admitting: Internal Medicine

## 2018-07-18 ENCOUNTER — Telehealth: Payer: Self-pay | Admitting: Internal Medicine

## 2018-07-18 ENCOUNTER — Encounter: Payer: Self-pay | Admitting: Vascular Surgery

## 2018-07-18 NOTE — Telephone Encounter (Signed)
Call to patient with recommendations from Dr. Saunders Revel.  Pt verbalized understanding and will call us if BP drops any lower or if she becomes symptomatic. Otherwise, she will fu with Dr. Caryl Comes as scheduled.   Advised pt to call for any further questions or concerns.

## 2018-07-18 NOTE — Telephone Encounter (Signed)
Patient calling  Patient was in the hospital over the weekend with a blood clot, says it was the size of a small egg States that her BP today is 107/80 with a pulse of 82 - would like to know if she should take her BP pill  Scheduled to see Dr Caryl Comes on 7/16 Please call to discuss

## 2018-07-18 NOTE — Telephone Encounter (Signed)
As long as she is asymptomatic, I recommend continuing her current medications and following up with Dr. Caryl Comes as planned.  Nelva Bush, MD [facility] Pager: [phone]

## 2018-07-18 NOTE — Telephone Encounter (Signed)
Pt reports that she was hospitalized over the weekend for a PE, was removed and started on eliquis. Since then, she has been noticing lower trend in BP.  Took BP med at 11 AM.   BP 99/66 , HR 80 @ 1:27 PM.  112/78, HR 89 @ 2:20 PM   Denies dizziness or weakness on standing. NO chest pain, no SOB, no swelling.   Pt takes Lasix 1 tablet (40 mg total) once daily.  Coreg 1 tablet (6.25 mg total) twice daily Entresto 24/26, 1 tablet, twice daily  She has a CT of L arm and R leg to rule out clot this Thursday.   Routed to provider to advise.

## 2018-07-20 NOTE — Telephone Encounter (Signed)
Patient calling States that her discomfort is getting worse It hurts to lift left arm / shoulder and it hurts when she coughs Please call to discuss

## 2018-07-21 ENCOUNTER — Ambulatory Visit: Payer: Medicare Other

## 2018-07-22 NOTE — Telephone Encounter (Addendum)
I spoke with the patient. I apologized that we had not called her back sooner, not sure how that got missed in triage.  She states her discomfort is improving. She ate some hot spicy soup and started coughing up some green sputum and feels better. Still somewhat productive cough, but no fever.   I have advised her to watch this and contact her PCP/ Urgent Care if her cough worsens or she develops a fever.  She states she also developed a boil on her back in the hospital. She has been getting pus out of this. I have advised if this is improving to continue to monitor, but it becomes inflamed around the area/ hot to touch she should contact her PCP.  She has follow up scheduled with our office on 07/28/18.   She voices understanding of the above and is agreeable.

## 2018-07-26 ENCOUNTER — Telehealth: Payer: Self-pay

## 2018-07-26 NOTE — Telephone Encounter (Signed)
[place] re- Covid pre-screen.

## 2018-07-28 ENCOUNTER — Other Ambulatory Visit: Payer: Self-pay

## 2018-07-28 ENCOUNTER — Encounter: Payer: Self-pay | Admitting: Internal Medicine

## 2018-07-28 ENCOUNTER — Ambulatory Visit (INDEPENDENT_AMBULATORY_CARE_PROVIDER_SITE_OTHER): Payer: Medicare Other | Admitting: Internal Medicine

## 2018-07-28 VITALS — BP 118/70 | HR 76 | Ht 67.0 in | Wt 287.0 lb

## 2018-07-28 DIAGNOSIS — I10 Essential (primary) hypertension: Secondary | ICD-10-CM | POA: Diagnosis not present

## 2018-07-28 DIAGNOSIS — I493 Ventricular premature depolarization: Secondary | ICD-10-CM

## 2018-07-28 DIAGNOSIS — I428 Other cardiomyopathies: Secondary | ICD-10-CM | POA: Diagnosis not present

## 2018-07-28 DIAGNOSIS — R51 Headache: Secondary | ICD-10-CM

## 2018-07-28 DIAGNOSIS — I5022 Chronic systolic (congestive) heart failure: Secondary | ICD-10-CM

## 2018-07-28 DIAGNOSIS — R519 Headache, unspecified: Secondary | ICD-10-CM

## 2018-07-28 MED ORDER — FUROSEMIDE 40 MG PO TABS: ORAL_TABLET | ORAL | 3 refills | Status: DC

## 2018-07-28 MED ORDER — ENTRESTO 24-26 MG PO TABS: 1.0000 | ORAL_TABLET | Freq: Two times a day (BID) | ORAL | 3 refills | Status: DC

## 2018-07-28 MED ORDER — CARVEDILOL 6.25 MG PO TABS: 6.2500 mg | ORAL_TABLET | Freq: Two times a day (BID) | ORAL | 3 refills | Status: DC

## 2018-07-28 NOTE — Patient Instructions (Signed)
Medication Instructions:   Your physician recommends that you continue on your current medications as directed. Please refer to the Current Medication list given to you today.  Eliquis samples Given to patient today in the office:  Medication Samples have been provided to the patient.  Qty: 4 boxes  LOT: [id number]  Exp.Date: 08/2020   If you need a refill on your cardiac medications before your next appointment, please call your pharmacy.   Lab work:  NONE Ordered  If you have labs (blood work) drawn today and your tests are completely normal, you will receive your results only by: Marland Kitchen MyChart Message (if you have MyChart) OR . A paper copy in the mail If you have any lab test that is abnormal or we need to change your treatment, we will call you to review the results.  Testing/Procedures:  Non-Cardiac CT scanning of the head, (CAT scanning), is a noninvasive, special x-ray that produces cross-sectional images of the body using x-rays and a computer. CT scans help physicians diagnose and treat medical conditions. For some CT exams, a contrast material is used to enhance visibility in the area of the body being studied. CT scans provide greater clarity and reveal more details than regular x-ray exams.    Follow-Up: At [facility], you and your health needs are our priority.  As part of our continuing mission to provide you with exceptional heart care, we have created designated Provider Care Teams.  These Care Teams include your primary Cardiologist (physician) and Advanced Practice Providers (APPs -  Physician Assistants and Nurse Practitioners) who all work together to provide you with the care you need, when you need it. You will need a follow up appointment in 4 months.  Please call our office 2 months in advance to schedule this appointment.

## 2018-07-28 NOTE — Progress Notes (Signed)
Patient Care Team: [name], MD as PCP - General (Family Medicine) Deboraha Sprang, MD as PCP - Cardiology (Cardiology)   HPI  Wendy Sanchez is a 68 y.o. 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  Initially seen in consultation 11/18  amiodarone was initiated   and subsequently ranolazine and then [facility] were added.  She is tolerating these medications well.  Blood pressures have been borderline but not with significant symptoms.  She underwent catheter ablation 4/19.  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  Ablation thus was limited but there is a marked reduction in PVC burden both in the lab and in follow-up with Dr. Elliot Cousin 6/19.   Seen last month in urgent care for chest pain.  Follow-up was arranged as she was seen by Dr Clayborn Bigness recommended outpatient>> CTA but it was scheduled for about a week later.  In the interim she developed worsening symptoms and went to the ER.  CTA + with RH strain Rx w apixoban she was treated with clot lysis using TPA as well as mechanical thrombectomy.  She has developed headache.  She continues to have right-sided edema and study had demonstrated right popliteal DVT.   DATE TEST EF   6/18 Echo  30 % LVE/LAE  8/18 Cath   25 % Normal CAs  8/19 Echo  20-25%    Date Cr K Mg Hgb   9/18 0.8 4.5   12.8  2/19 1.10 5.0  13.4  9/19 0.8 4.6    7/20 0.93 4.2  11.6   DATE Holter PVCs meds  11/18 41% +amio + ranolazine  2/19 17% +entresto  3/19 24% amio d/c   10/19 4%          Past Medical History:  Diagnosis Date  . Allergic rhinitis   . Arthritis   . Asthma    problems with fumes and aerosols cause asthma  . Chest pain 10/19/2016  . Complication of anesthesia    awakens during surgery; has occurred with last 3-4 surgeries   . COPD (chronic obstructive pulmonary disease) ([place])   . Environmental allergies    fumes   . Fibromyalgia   . GERD (gastroesophageal reflux disease)   . Headache    . Hemorrhoids   . History of bronchitis   . Hx of total knee arthroplasty 12/13/2015  . Hypertension   . Morbid obesity with BMI of 45.0-49.9, adult ([place]) 07/25/2015  . NICM (nonischemic cardiomyopathy) ([place])    a. ? PVC mediated;  b. 06/2016 Echo: EF 25-30%, mild LVH, mild LAE, mild AI/MR/TR/PR; c. 08/2016 Cath: nl cors, EF 25%.  . Numbness    hands bilat when driving; improves when not preforming task   . Osteoarthritis of left knee 08/02/2015  . Pes anserinus bursitis of left knee 05/18/2016  . Presence of right artificial knee joint 05/18/2016  . PVC (premature ventricular contraction) 06/04/2017  . PVC's (premature ventricular contractions)    a. 11/2016 Amio started;  b. 11/29/16 48h Holter: 70962 PVC's (41%); c. 12/2016 24h Holter: 18040 PVC's (15%); c. 02/2017 48h Holter: 37262 PVC's (41%).  Marland Kitchen Spinal stenosis of lumbar region 06/19/2015  . Status post gastric banding   . 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 Tinnitus    comes and goes   . Unilateral primary osteoarthritis, right knee 12/13/2015  . Vertigo    6 months ago approx     Past Surgical History:  Procedure Laterality Date  . ABDOMINAL HYSTERECTOMY  1979   left ovary remains  . ABDOMINAL HYSTERECTOMY    . CHOLECYSTECTOMY    . COLONOSCOPY    . fibrous tissue removed from right shoulder and back of neck      2 years ago   . GANGLION CYST EXCISION    . GASTRIC BYPASS  1980   states had bypass with banding and band left in -  . GASTRIC BYPASS OPEN    . JOINT REPLACEMENT    . KNEE SURGERY Bilateral    both knees  2001  . NECK SURGERY N/A 1205/19   ganglion cyst removal, [place]   . PULMONARY THROMBECTOMY Bilateral 07/14/2018   Procedure: PULMONARY THROMBECTOMY;  Surgeon: Algernon Huxley, MD;  Location: [place] [facility];  Service: Cardiovascular;  Laterality: Bilateral;  . PVC ABLATION N/A 06/04/2017   Procedure: PVC ABLATION;  Surgeon: Evans Lance, MD;  Location: [place] [facility];  Service:  Cardiovascular;  Laterality: N/A;  . RIGHT/LEFT HEART CATH AND CORONARY ANGIOGRAPHY Bilateral 08/12/2016   Procedure: Right/Left Heart Cath and Coronary Angiography;  Surgeon: Yolonda Kida, MD;  Location: [place];  Service: Cardiovascular;  Laterality: Bilateral;  . TONSILLECTOMY     age 60  . TOTAL KNEE ARTHROPLASTY Left 08/02/2015   Procedure: LEFT TOTAL KNEE ARTHROPLASTY;  Surgeon: Mcarthur Rossetti, MD;  Location: [facility];  Service: Orthopedics;  Laterality: Left;  . TOTAL KNEE ARTHROPLASTY Right 12/13/2015   Procedure: RIGHT TOTAL KNEE ARTHROPLASTY;  Surgeon: Mcarthur Rossetti, MD;  Location: [facility];  Service: Orthopedics;  Laterality: Right;    Current Outpatient Medications  Medication Sig Dispense Refill  . albuterol (PROVENTIL HFA;VENTOLIN HFA) 108 (90 Base) MCG/ACT inhaler Inhale 2 puffs into the lungs every 6 (six) hours as needed for wheezing or shortness of breath.    Marland Kitchen apixaban (ELIQUIS) 5 MG TABS tablet Take 1 tablet (5 mg total) by mouth 2 (two) times daily. 60 tablet 0  . aspirin EC 81 MG tablet Take 1 tablet (81 mg total) daily by mouth. 90 tablet 3  . azelastine (ASTELIN) 0.1 % nasal spray Place into both nostrils 2 (two) times daily. Use in each nostril as directed    . carvedilol (COREG) 6.25 MG tablet TAKE 1 TABLET BY MOUTH TWO  TIMES DAILY 180 tablet 0  . cyclobenzaprine (FLEXERIL) 10 MG tablet Take 1 tablet (10 mg total) by mouth 3 (three) times daily as needed. for muscle spams 270 tablet 1  . diclofenac sodium (VOLTAREN) 1 % GEL Apply 2 g topically 4 (four) times daily as needed (pain).     Marland Kitchen diphenhydrAMINE (BENADRYL) 25 mg capsule Take 25 mg by mouth every 8 (eight) hours as needed for allergies.     Marland Kitchen docusate sodium (COLACE) 100 MG capsule Take 200 mg by mouth at bedtime.     Marland Kitchen ELDERBERRY PO Take 1 tablet by mouth daily.    . folic acid (FOLVITE) 1 MG tablet Take 1 mg by mouth at bedtime.     . furosemide (LASIX) 40 MG tablet Take 2  tablets (80 mg) by mouth once daily as directed 180 tablet 3  . gabapentin (NEURONTIN) 300 MG capsule Take 2 capsules (600 mg total) by mouth 3 (three) times daily. 540 capsule 0  . guaiFENesin (MUCINEX) 600 MG 12 hr tablet Take 1,200 mg by mouth 2 (two) times daily.    Marland Kitchen levocetirizine (XYZAL) 5 MG tablet Take 5 mg  by mouth at bedtime.     . mirabegron ER (MYRBETRIQ) 50 MG TB24 tablet Take 50 mg by mouth daily.    . montelukast (SINGULAIR) 10 MG tablet Take 10 mg by mouth at bedtime.    . Multiple Vitamins-Minerals (MULTIVITAMIN GUMMIES ADULTS) CHEW Chew 2 tablets by mouth daily.     . nortriptyline (PAMELOR) 50 MG capsule Take 50 mg by mouth at bedtime.    Marland Kitchen nystatin cream (MYCOSTATIN) Apply 1 application topically 2 (two) times daily as needed (irritation).     Marland Kitchen oxyCODONE-acetaminophen (PERCOCET) 7.5-325 MG tablet Take 1 tablet by mouth every 6 (six) hours as needed for up to 30 days for moderate pain or severe pain. 120 tablet 0  . pantoprazole (PROTONIX) 40 MG tablet Take 40 mg by mouth 2 (two) times daily.    . potassium chloride SA (K-DUR,KLOR-CON) 20 MEQ tablet Take 20 mEq by mouth See admin instructions. Take 20 meq every third day    . Probiotic Product (PROBIOTIC COLON SUPPORT) CAPS Take 1 capsule by mouth at bedtime.    . sacubitril-valsartan (ENTRESTO) 24-26 MG Take 1 tablet by mouth 2 (two) times daily. 180 tablet 3  . sucralfate (CARAFATE) 1 G tablet Take 1 g by mouth 2 (two) times daily as needed (esophageal burning).     Marland Kitchen umeclidinium-vilanterol (ANORO ELLIPTA) 62.5-25 MCG/INH AEPB Inhale 1 puff into the lungs daily.    . vitamin C (ASCORBIC ACID) 500 MG tablet Take 500 mg by mouth daily.    . Vitamin D, Ergocalciferol, (DRISDOL) 50000 UNITS CAPS capsule Take 50,000 Units by mouth every 7 (seven) days. thursdays    . apixaban (ELIQUIS) 5 MG TABS tablet Take 2 tablets (10 mg total) by mouth 2 (two) times daily for 6 days. 24 tablet 0   No current facility-administered  medications for this visit.     Allergies  Allergen Reactions  . Hydrocodone-Acetaminophen Swelling  . Iodine Anaphylaxis and Swelling  . Other Other (See Comments)    ALLERGY TO METAL - BLACKENS SKIN AND CAUSES A RASH   . Tape Swelling and Other (See Comments)    Skin comes off.  Paper tape is ok  . Shellfish Allergy Nausea And Vomiting and Other (See Comments)    Episode of GI infection after eating clam chowder. Still eats shrimp and other seafood      Review of Systems negative except from HPI and PMH  Physical Exam BP 118/70 (BP Location: Left Arm, Patient Position: Sitting, Cuff Size: Normal)   Pulse 76   Ht 5\' 7"  (1.702 m)   Wt 287 lb (130.2 kg)   SpO2 98%   BMI 44.95 kg/m  Well developed and Morbidly obese in no acute distress HENT normal Neck supple with JVP-flat Clear Regular rate and rhythm, no  gallop No  murmur Abd-soft with active BS No Clubbing cyanosis  R>l 1+  edema Skin-warm and dry A & Oriented  Grossly normal sensory and motor function  ECG sinus 78 17/08/38      Assessment and Plan:  Nonischemic cardiomyopathy persisting post ablation  PVCs-- s/p ablation largely extinguished     Right leg DVT/bilateral pulmonary embolism  Asthma-acute on chronic  Morbidly obese  Chronic systolic heart failure    Recently hospitalized for acute pulmonary embolism for which she underwent thrombectomy.  Volume status is stable.  On anticoagulant.  Refilled medications.  We will see her again in 4 months.  We spent more than 50% of our >25 min  visit in face to face counseling regarding the above    Current medicines are reviewed at length with the patient today .  The patient does not have concerns regarding medicines.

## 2018-08-01 ENCOUNTER — Ambulatory Visit
Admission: RE | Admit: 2018-08-01 | Discharge: 2018-08-01 | Disposition: A | Discharge status: Home / Self Care | Payer: Medicare Other | Source: Ambulatory Visit | Attending: Internal Medicine | Admitting: Internal Medicine

## 2018-08-01 ENCOUNTER — Other Ambulatory Visit: Payer: Self-pay

## 2018-08-01 DIAGNOSIS — R519 Headache, unspecified: Secondary | ICD-10-CM

## 2018-08-01 DIAGNOSIS — R51 Headache: Secondary | ICD-10-CM | POA: Insufficient documentation

## 2018-08-04 ENCOUNTER — Other Ambulatory Visit: Payer: Self-pay | Admitting: Anesthesiology

## 2018-08-16 ENCOUNTER — Other Ambulatory Visit: Payer: Self-pay

## 2018-08-16 ENCOUNTER — Ambulatory Visit: Payer: Medicare Other | Attending: Anesthesiology | Admitting: Anesthesiology

## 2018-08-16 DIAGNOSIS — M25552 Pain in left hip: Secondary | ICD-10-CM

## 2018-08-16 DIAGNOSIS — M5136 Other intervertebral disc degeneration, lumbar region: Secondary | ICD-10-CM

## 2018-08-16 DIAGNOSIS — M1711 Unilateral primary osteoarthritis, right knee: Secondary | ICD-10-CM | POA: Diagnosis not present

## 2018-08-16 DIAGNOSIS — M51369 Other intervertebral disc degeneration, lumbar region without mention of lumbar back pain or lower extremity pain: Secondary | ICD-10-CM

## 2018-08-16 DIAGNOSIS — F1190 Opioid use, unspecified, uncomplicated: Secondary | ICD-10-CM

## 2018-08-16 DIAGNOSIS — M25551 Pain in right hip: Secondary | ICD-10-CM

## 2018-08-16 DIAGNOSIS — M1712 Unilateral primary osteoarthritis, left knee: Secondary | ICD-10-CM

## 2018-08-16 DIAGNOSIS — G8929 Other chronic pain: Secondary | ICD-10-CM

## 2018-08-16 DIAGNOSIS — M5441 Lumbago with sciatica, right side: Secondary | ICD-10-CM

## 2018-08-16 DIAGNOSIS — M5442 Lumbago with sciatica, left side: Secondary | ICD-10-CM | POA: Diagnosis not present

## 2018-08-16 DIAGNOSIS — M19011 Primary osteoarthritis, right shoulder: Secondary | ICD-10-CM

## 2018-08-16 DIAGNOSIS — M48062 Spinal stenosis, lumbar region with neurogenic claudication: Secondary | ICD-10-CM

## 2018-08-16 DIAGNOSIS — G894 Chronic pain syndrome: Secondary | ICD-10-CM

## 2018-08-16 MED ORDER — OXYCODONE-ACETAMINOPHEN 7.5-325 MG PO TABS: 1.0000 | ORAL_TABLET | Freq: Four times a day (QID) | ORAL | 0 refills | Status: DC | PRN

## 2018-08-16 NOTE — Progress Notes (Signed)
Virtual Visit via Telephone Note  I connected with Wendy Sanchez on 08/16/18 at  2:15 PM EDT by telephone and verified that I am speaking with the correct person using two identifiers.  Location: Patient: Home Provider: Pain control center   I discussed the limitations, risks, security and privacy concerns of performing an evaluation and management service by telephone and the availability of in person appointments. I also discussed with the patient that there may be a patient responsible charge related to this service. The patient expressed understanding and agreed to proceed.   History of Present Illness: I spoke with Wendy Sanchez via telephone today.  The quality characteristic distribution of her pain is been stable but has increased somewhat.  She has had a recent blood clot and unfortunately has also had to stop her anti-inflammatory medications..  The pain primarily affecting the back and lower legs.  No real changes are noted at this time per her discussion and description.  She is taking her medications as prescribed as well and these are working well for her.  Based on her description she is getting good lifestyle improvement and is more functional with the medications.  She does not report any side effects.  Otherwise she is in her usual state of health    Observations/Objective: Current Outpatient Medications:  .  albuterol (PROVENTIL HFA;VENTOLIN HFA) 108 (90 Base) MCG/ACT inhaler, Inhale 2 puffs into the lungs every 6 (six) hours as needed for wheezing or shortness of breath., Disp: , Rfl:  .  apixaban (ELIQUIS) 5 MG TABS tablet, Take 2 tablets (10 mg total) by mouth 2 (two) times daily for 6 days., Disp: 24 tablet, Rfl: 0 .  apixaban (ELIQUIS) 5 MG TABS tablet, Take 1 tablet (5 mg total) by mouth 2 (two) times daily., Disp: 60 tablet, Rfl: 0 .  aspirin EC 81 MG tablet, Take 1 tablet (81 mg total) daily by mouth., Disp: 90 tablet, Rfl: 3 .  azelastine (ASTELIN) 0.1 % nasal spray, Place  into both nostrils 2 (two) times daily. Use in each nostril as directed, Disp: , Rfl:  .  carvedilol (COREG) 6.25 MG tablet, Take 1 tablet (6.25 mg total) by mouth 2 (two) times daily., Disp: 180 tablet, Rfl: 3 .  cyclobenzaprine (FLEXERIL) 10 MG tablet, Take 1 tablet (10 mg total) by mouth 3 (three) times daily as needed. for muscle spams, Disp: 270 tablet, Rfl: 1 .  diclofenac sodium (VOLTAREN) 1 % GEL, Apply 2 g topically 4 (four) times daily as needed (pain). , Disp: , Rfl:  .  diphenhydrAMINE (BENADRYL) 25 mg capsule, Take 25 mg by mouth every 8 (eight) hours as needed for allergies. , Disp: , Rfl:  .  docusate sodium (COLACE) 100 MG capsule, Take 200 mg by mouth at bedtime. , Disp: , Rfl:  .  ELDERBERRY PO, Take 1 tablet by mouth daily., Disp: , Rfl:  .  folic acid (FOLVITE) 1 MG tablet, Take 1 mg by mouth at bedtime. , Disp: , Rfl:  .  furosemide (LASIX) 40 MG tablet, Take 2 tablets (80 mg) by mouth once daily as directed, Disp: 180 tablet, Rfl: 3 .  gabapentin (NEURONTIN) 300 MG capsule, Take 2 capsules (600 mg total) by mouth 3 (three) times daily., Disp: 540 capsule, Rfl: 0 .  guaiFENesin (MUCINEX) 600 MG 12 hr tablet, Take 1,200 mg by mouth 2 (two) times daily., Disp: , Rfl:  .  levocetirizine (XYZAL) 5 MG tablet, Take 5 mg by mouth at bedtime. ,  Disp: , Rfl:  .  mirabegron ER (MYRBETRIQ) 50 MG TB24 tablet, Take 50 mg by mouth daily., Disp: , Rfl:  .  montelukast (SINGULAIR) 10 MG tablet, Take 10 mg by mouth at bedtime., Disp: , Rfl:  .  Multiple Vitamins-Minerals (MULTIVITAMIN GUMMIES ADULTS) CHEW, Chew 2 tablets by mouth daily. , Disp: , Rfl:  .  nortriptyline (PAMELOR) 50 MG capsule, Take 50 mg by mouth at bedtime., Disp: , Rfl:  .  nystatin cream (MYCOSTATIN), Apply 1 application topically 2 (two) times daily as needed (irritation). , Disp: , Rfl:  .  oxyCODONE-acetaminophen (PERCOCET) 7.5-325 MG tablet, Take 1 tablet by mouth every 6 (six) hours as needed for up to 30 days for  moderate pain or severe pain., Disp: 120 tablet, Rfl: 0 .  pantoprazole (PROTONIX) 40 MG tablet, Take 40 mg by mouth 2 (two) times daily., Disp: , Rfl:  .  potassium chloride SA (K-DUR,KLOR-CON) 20 MEQ tablet, Take 20 mEq by mouth See admin instructions. Take 20 meq every third day, Disp: , Rfl:  .  Probiotic Product (PROBIOTIC COLON SUPPORT) CAPS, Take 1 capsule by mouth at bedtime., Disp: , Rfl:  .  sacubitril-valsartan (ENTRESTO) 24-26 MG, Take 1 tablet by mouth 2 (two) times daily., Disp: 180 tablet, Rfl: 3 .  sucralfate (CARAFATE) 1 G tablet, Take 1 g by mouth 2 (two) times daily as needed (esophageal burning). , Disp: , Rfl:  .  umeclidinium-vilanterol (ANORO ELLIPTA) 62.5-25 MCG/INH AEPB, Inhale 1 puff into the lungs daily., Disp: , Rfl:  .  vitamin C (ASCORBIC ACID) 500 MG tablet, Take 500 mg by mouth daily., Disp: , Rfl:  .  Vitamin D, Ergocalciferol, (DRISDOL) 50000 UNITS CAPS capsule, Take 50,000 Units by mouth every 7 (seven) days. thursdays, Disp: , Rfl:    Assessment and Plan: 1. Unilateral primary osteoarthritis, right knee   2. Osteoarthritis of left knee, unspecified osteoarthritis type   3. DDD (degenerative disc disease), lumbar   4. Chronic bilateral low back pain with bilateral sciatica   5. Hip pain, bilateral   6. Chronic, continuous use of opioids   7. Chronic pain syndrome   8. Primary osteoarthritis of right shoulder   9. Spinal stenosis of lumbar region with neurogenic claudication   10. Chronic pain of right hip   Based on our review today and upon review of the [facility] practitioner database information we will go ahead and refill her medications for the next 2 months but I am going to increase her strength to the 10 mg tablets from the 7.5.Marland Kitchen  She is scheduled for a return to clinic in 2 months.  She is to continue with efforts at weight loss and stretching strengthening as previously reviewed.  She is to continue follow-up with her primary care physicians for  baseline medical care.  And to contact us should she have any further pain related problems.   Follow Up Instructions:    I discussed the assessment and treatment plan with the patient. The patient was provided an opportunity to ask questions and all were answered. The patient agreed with the plan and demonstrated an understanding of the instructions.   The patient was advised to call back or seek an in-person evaluation if the symptoms worsen or if the condition fails to improve as anticipated.  I provided 30 minutes of non-face-to-face time during this encounter.   Molli Barrows, MD

## 2018-08-17 ENCOUNTER — Other Ambulatory Visit: Payer: Self-pay

## 2018-08-17 ENCOUNTER — Ambulatory Visit: Payer: Medicare Other | Attending: Anesthesiology | Admitting: Anesthesiology

## 2018-08-17 MED ORDER — OXYCODONE-ACETAMINOPHEN 10-325 MG PO TABS: 1.0000 | ORAL_TABLET | Freq: Four times a day (QID) | ORAL | 0 refills | Status: DC | PRN

## 2018-09-20 ENCOUNTER — Other Ambulatory Visit: Payer: Self-pay | Admitting: *Deleted

## 2018-09-20 ENCOUNTER — Telehealth: Payer: Self-pay | Admitting: *Deleted

## 2018-09-20 MED ORDER — OXYCODONE-ACETAMINOPHEN 10-325 MG PO TABS: 1.0000 | ORAL_TABLET | Freq: Four times a day (QID) | ORAL | 0 refills | Status: AC | PRN

## 2018-09-20 NOTE — Telephone Encounter (Signed)
Spoke with patient and she was last seen on 08/16/18 but was only prescribed 1 month and should have been 2 for oxycodone - apap 10-325 mg.  I will send Dr Andree Elk a message to have this prescribed for her.

## 2018-09-20 NOTE — Addendum Note (Signed)
Addended by: Molli Barrows on: 09/20/2018 02:30 PM   Modules accepted: Orders

## 2018-09-20 NOTE — Telephone Encounter (Signed)
Talked to patient to let her know that Rx was filled on 09/16/18 through [facility] per registry.  She states she has not received anything, she is going to reach out to [facility] to ask about the fill.  She will let us know if she needs anything else.

## 2018-10-07 ENCOUNTER — Telehealth: Payer: Self-pay | Admitting: Internal Medicine

## 2018-10-07 NOTE — Telephone Encounter (Addendum)
Spoke with the pt and she verbalized understanding.. since she is not urinating briskly she will take 120 mg in the morning X 3 days and will call Monday to let us know how she is doing.. I advised her to be sure to take her K and eat some K rich foods over the weekend.

## 2018-10-07 NOTE — Telephone Encounter (Addendum)
Pt called to report that she has been having increased ankle edema up to her mid calf..   She says it has been worsening over the past 3 days.. she has been watching her sodium intake but she did have a can of Cream of mushroom soup 3 days ago.. she has been elevating her feet the best she can.   She has gained 4 lbs since yesterday.  Pt says she has some SOB with exertion. She has also had a headache and body aches but says she has to go off of her NSAID's and with the wet weather her arthritis is flaring up.   Her BP:   9/23 129/84 9/24 148/92 and 144/90 9/25  137/94.   Pt has take her lasix 80 mg this morning and took another 40 mg at 1 pm.. 5 hours later as per Dr. Olin Pia advice at her last visit.   I advised pt to give the Lasix more time.Marland Kitchen to continue to elevate her extremities and monitor her BP.   I will forward to Dr. Caryl Comes for review. Last OV 07/2018 due back in 4 months.Marland Kitchen

## 2018-10-07 NOTE — Telephone Encounter (Signed)
Pt c/o BP issue: STAT if pt c/o blurred vision, one-sided weakness or slurred speech  1. What are your last 5 BP readings? 9/25 137/94 9/24 148/82 9am  144/90 2pm 9/23 129/84  2. Are you having any other symptoms (ex. Dizziness, headache, blurred vision, passed out)? States vision has changed within last week (was for just reading but now needs for tv), slight headaches for 2 days (weather possibly). Patient has procedure on 9/16 and unsure if this may be related. Patient also has been retaining fluid lately.   3. What is your BP issue? High bp compared to usual readings

## 2018-10-07 NOTE — Telephone Encounter (Signed)
A if she is urinating with 80 then lets do that bid x 3 d   If not briskly urinating with 80 increase her am dose to 120 x 3d Thanks SK

## 2018-10-30 ENCOUNTER — Other Ambulatory Visit: Payer: Self-pay

## 2018-10-30 ENCOUNTER — Ambulatory Visit
Admission: EM | Admit: 2018-10-30 | Discharge: 2018-10-30 | Disposition: A | Discharge status: Home / Self Care | Payer: Medicare Other | Attending: Family Medicine | Admitting: Family Medicine

## 2018-10-30 ENCOUNTER — Encounter: Payer: Self-pay | Admitting: Emergency Medicine

## 2018-10-30 DIAGNOSIS — H6502 Acute serous otitis media, left ear: Secondary | ICD-10-CM | POA: Diagnosis not present

## 2018-10-30 DIAGNOSIS — R590 Localized enlarged lymph nodes: Secondary | ICD-10-CM | POA: Diagnosis not present

## 2018-10-30 MED ORDER — AMOXICILLIN 875 MG PO TABS: 875.0000 mg | ORAL_TABLET | Freq: Two times a day (BID) | ORAL | 0 refills | Status: DC

## 2018-10-30 NOTE — ED Triage Notes (Signed)
Pt c/o left sided neck pain, and a "knot" in the area of the pain and warm to the touch. She states that she woke up with it yesterday. She states that the swelling has gone down some from yesterday. Denies fever.

## 2018-10-30 NOTE — Discharge Instructions (Signed)
Follow up with ENT if lymphadenopathy does not resolve

## 2018-10-30 NOTE — ED Provider Notes (Signed)
[facility]    CSN: [id number] Arrival date & time: 10/30/18  1121      History   Chief Complaint Chief Complaint  Patient presents with  . Neck Pain    HPI Wendy Sanchez is a 68 y.o. female.   68 yo female with a c/o left sided neck pain and lump as well as left ear discomfort/pressure since yesterday. Denies any ear drainage, fevers, chills.      Past Medical History:  Diagnosis Date  . Allergic rhinitis   . Arthritis   . Asthma    problems with fumes and aerosols cause asthma  . Chest pain 10/19/2016  . Complication of anesthesia    awakens during surgery; has occurred with last 3-4 surgeries   . COPD (chronic obstructive pulmonary disease) ([place])   . Environmental allergies    fumes   . Fibromyalgia   . GERD (gastroesophageal reflux disease)   . Headache   . Hemorrhoids   . History of bronchitis   . Hx of total knee arthroplasty 12/13/2015  . Hypertension   . Morbid obesity with BMI of 45.0-49.9, adult ([place]) 07/25/2015  . NICM (nonischemic cardiomyopathy) ([place])    a. ? PVC mediated;  b. 06/2016 Echo: EF 25-30%, mild LVH, mild LAE, mild AI/MR/TR/PR; c. 08/2016 Cath: nl cors, EF 25%.  . Numbness    hands bilat when driving; improves when not preforming task   . Osteoarthritis of left knee 08/02/2015  . Pes anserinus bursitis of left knee 05/18/2016  . Presence of right artificial knee joint 05/18/2016  . PVC (premature ventricular contraction) 06/04/2017  . PVC's (premature ventricular contractions)    a. 11/2016 Amio started;  b. 11/29/16 48h Holter: [id number] PVC's (41%); c. 12/2016 24h Holter: 18040 PVC's (15%); c. 02/2017 48h Holter: 37262 PVC's (41%).  Marland Kitchen Spinal stenosis of lumbar region 06/19/2015  . Status post gastric banding   . 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 Tinnitus    comes and goes   . Unilateral primary osteoarthritis, right knee 12/13/2015  . Vertigo    6 months ago approx     Patient Active Problem List   Diagnosis  Date Noted  . Pulmonary embolism ([place]) 07/13/2018  . DDD (degenerative disc disease), lumbar 06/30/2018  . Chronic low back pain 06/30/2018  . Hip pain, bilateral 06/30/2018  . Chronic, continuous use of opioids 06/30/2018  . PVC (premature ventricular contraction) 06/04/2017  . Chest pain 10/19/2016  . Presence of right artificial knee joint 05/18/2016  . Pes anserinus bursitis of left knee 05/18/2016  . Unilateral primary osteoarthritis, right knee 12/13/2015  . Hx of total knee arthroplasty 12/13/2015  . Osteoarthritis of left knee 08/02/2015  . 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 Morbid obesity with BMI of 45.0-49.9, adult ([place]) 07/25/2015  . Spinal stenosis of lumbar region 06/19/2015  . Chronic pain syndrome 10/30/2014    Past Surgical History:  Procedure Laterality Date  . ABDOMINAL HYSTERECTOMY  1979   left ovary remains  . ABDOMINAL HYSTERECTOMY    . CHOLECYSTECTOMY    . COLONOSCOPY    . fibrous tissue removed from right shoulder and back of neck      2 years ago   . GANGLION CYST EXCISION    . GASTRIC BYPASS  1980   states had bypass with banding and band left in -  . GASTRIC BYPASS OPEN    . JOINT REPLACEMENT    . KNEE SURGERY  Bilateral    both knees  2001  . NECK SURGERY N/A 1205/19   ganglion cyst removal, [place]   . PULMONARY THROMBECTOMY Bilateral 07/14/2018   Procedure: PULMONARY THROMBECTOMY;  Surgeon: Algernon Huxley, MD;  Location: [facility];  Service: Cardiovascular;  Laterality: Bilateral;  . PVC ABLATION N/A 06/04/2017   Procedure: PVC ABLATION;  Surgeon: Evans Lance, MD;  Location: [facility];  Service: Cardiovascular;  Laterality: N/A;  . RIGHT/LEFT HEART CATH AND CORONARY ANGIOGRAPHY Bilateral 08/12/2016   Procedure: Right/Left Heart Cath and Coronary Angiography;  Surgeon: Yolonda Kida, MD;  Location: [facility];  Service: Cardiovascular;  Laterality: Bilateral;  . TONSILLECTOMY     age 74  . TOTAL  KNEE ARTHROPLASTY Left 08/02/2015   Procedure: LEFT TOTAL KNEE ARTHROPLASTY;  Surgeon: Mcarthur Rossetti, MD;  Location: [facility];  Service: Orthopedics;  Laterality: Left;  . TOTAL KNEE ARTHROPLASTY Right 12/13/2015   Procedure: RIGHT TOTAL KNEE ARTHROPLASTY;  Surgeon: Mcarthur Rossetti, MD;  Location: [facility];  Service: Orthopedics;  Laterality: Right;    OB History   No obstetric history on file.      Home Medications    Prior to Admission medications   Medication Sig Start Date End Date Taking? Authorizing Provider  albuterol (PROVENTIL HFA;VENTOLIN HFA) 108 (90 Base) MCG/ACT inhaler Inhale 2 puffs into the lungs every 6 (six) hours as needed for wheezing or shortness of breath.   Yes [provider]  apixaban (ELIQUIS) 5 MG TABS tablet Take 1 tablet (5 mg total) by mouth 2 (two) times daily. 07/22/18 10/30/18 Yes Pyreddy, Reatha Harps, MD  azelastine (ASTELIN) 0.1 % nasal spray Place into both nostrils 2 (two) times daily. Use in each nostril as directed   Yes [provider]  carvedilol (COREG) 6.25 MG tablet Take 1 tablet (6.25 mg total) by mouth 2 (two) times daily. 07/28/18  Yes Deboraha Sprang, MD  cyclobenzaprine (FLEXERIL) 10 MG tablet Take 1 tablet (10 mg total) by mouth 3 (three) times daily as needed. for muscle spams 09/08/17  Yes Molli Barrows, MD  diclofenac sodium (VOLTAREN) 1 % GEL Apply 2 g topically 4 (four) times daily as needed (pain).    Yes [provider]  diphenhydrAMINE (BENADRYL) 25 mg capsule Take 25 mg by mouth every 8 (eight) hours as needed for allergies.    Yes [provider]  docusate sodium (COLACE) 100 MG capsule Take 200 mg by mouth at bedtime.    Yes [provider]  ELDERBERRY PO Take 1 tablet by mouth daily.   Yes [provider]  fluconazole (DIFLUCAN) 100 MG tablet Take by mouth. 10/25/18 11/24/18 Yes [provider]  folic acid (FOLVITE) 1 MG tablet Take 1 mg by mouth at bedtime.    Yes  [provider]  furosemide (LASIX) 40 MG tablet Take 2 tablets (80 mg) by mouth once daily as directed 07/28/18  Yes Deboraha Sprang, MD  guaiFENesin (MUCINEX) 600 MG 12 hr tablet Take 1,200 mg by mouth 2 (two) times daily.   Yes [provider]  levocetirizine (XYZAL) 5 MG tablet Take 5 mg by mouth at bedtime.    Yes [provider]  mirabegron ER (MYRBETRIQ) 50 MG TB24 tablet Take 50 mg by mouth daily.   Yes [provider]  montelukast (SINGULAIR) 10 MG tablet Take 10 mg by mouth at bedtime.   Yes [provider]  Multiple Vitamins-Minerals (MULTIVITAMIN GUMMIES ADULTS)  CHEW Chew 2 tablets by mouth daily.    Yes [provider]  nortriptyline (PAMELOR) 50 MG capsule Take 50 mg by mouth at bedtime.   Yes [provider]  pantoprazole (PROTONIX) 40 MG tablet Take 40 mg by mouth 2 (two) times daily.   Yes [provider]  potassium chloride SA (K-DUR,KLOR-CON) 20 MEQ tablet Take 20 mEq by mouth See admin instructions. Take 20 meq every third day   Yes [provider]  Probiotic Product (PROBIOTIC COLON SUPPORT) CAPS Take 1 capsule by mouth at bedtime.   Yes [provider]  sacubitril-valsartan (ENTRESTO) 24-26 MG Take 1 tablet by mouth 2 (two) times daily. 07/28/18  Yes Deboraha Sprang, MD  sucralfate (CARAFATE) 1 G tablet Take 1 g by mouth 2 (two) times daily as needed (esophageal burning).    Yes [provider]  umeclidinium-vilanterol (ANORO ELLIPTA) 62.5-25 MCG/INH AEPB Inhale 1 puff into the lungs daily. 12/23/17  Yes [provider]  vitamin C (ASCORBIC ACID) 500 MG tablet Take 500 mg by mouth daily.   Yes [provider]  Vitamin D, Ergocalciferol, (DRISDOL) 50000 UNITS CAPS capsule Take 50,000 Units by mouth every 7 (seven) days. thursdays   Yes [provider]  amoxicillin (AMOXIL) 875 MG tablet Take 1 tablet (875 mg total) by mouth 2 (two) times daily. 10/30/18    Norval Gable, MD  apixaban (ELIQUIS) 5 MG TABS tablet Take 2 tablets (10 mg total) by mouth 2 (two) times daily for 6 days. 07/16/18 07/22/18  Saundra Shelling, MD  aspirin EC 81 MG tablet Take 1 tablet (81 mg total) daily by mouth. 11/18/16   Deboraha Sprang, MD  gabapentin (NEURONTIN) 300 MG capsule Take 2 capsules (600 mg total) by mouth 3 (three) times daily. 05/02/18 07/31/18  Molli Barrows, MD    Family History Family History  Problem Relation Age of Onset  . Hypertension Father   . Deep vein thrombosis Father   . Dementia Mother   . Diabetes Sister   . Congestive Heart Failure Sister   . Congestive Heart Failure Brother   . Congestive Heart Failure Daughter   . Congestive Heart Failure Son   . Breast cancer Maternal Aunt        great aunt    Social History Social History   Tobacco Use  . Smoking status: Former Smoker    Packs/day: 0.10    Years: 50.00    Pack years: 5.00    Types: Cigarettes    Quit date: 07/06/2018    Years since quitting: 0.3  . Smokeless tobacco: Current User    Types: Snuff, Chew    Last attempt to quit: 01/13/1995  Substance Use Topics  . Alcohol use: No    Alcohol/week: 0.0 standard drinks  . Drug use: No     Allergies   Hydrocodone-acetaminophen, Iodine, Other, Tape, and Shellfish allergy   Review of Systems Review of Systems   Physical Exam Triage Vital Signs ED Triage Vitals  Enc Vitals Group     BP 10/30/18 1135 102/69     Pulse Rate 10/30/18 1135 75     Resp 10/30/18 1135 18     Temp 10/30/18 1135 98.5 F (36.9 C)     Temp Source 10/30/18 1135 Oral     SpO2 10/30/18 1135 100 %     Weight 10/30/18 1129 288 lb (130.6 kg)     Height 10/30/18 1129 5\' 7"  (1.702 m)  Head Circumference --      Peak Flow --      Pain Score --      Pain Loc --      Pain Edu? --      Excl. in [place]? --    No data found.  Updated Vital Signs BP 102/69 (BP Location: Left Arm)   Pulse 75   Temp 98.5 F (36.9 C) (Oral)   Resp 18   Ht 5\' 7"   (1.702 m)   Wt 130.6 kg   SpO2 100%   BMI 45.11 kg/m   Visual Acuity Right Eye Distance:   Left Eye Distance:   Bilateral Distance:    Right Eye Near:   Left Eye Near:    Bilateral Near:     Physical Exam Vitals signs and nursing note reviewed.  Constitutional:      General: She is not in acute distress.    Appearance: She is not toxic-appearing or diaphoretic.  HENT:     Right Ear: Tympanic membrane, ear canal and external ear normal.     Left Ear: Ear canal and external ear normal. A middle ear effusion is present. Tympanic membrane is erythematous.     Mouth/Throat:     Pharynx: Oropharynx is clear.  Pulmonary:     Effort: Pulmonary effort is normal. No respiratory distress.  Lymphadenopathy:     Cervical: Cervical adenopathy (left anterior cervical; tender) present.  Neurological:     Mental Status: She is alert.      UC Treatments / Results  Labs (all labs ordered are listed, but only abnormal results are displayed) Labs Reviewed - No data to display  EKG   Radiology No results found.  Procedures Procedures (including critical care time)  Medications Ordered in UC Medications - No data to display  Initial Impression / Assessment and Plan / UC Course  I have reviewed the triage vital signs and the nursing notes.  Pertinent labs & imaging results that were available during my care of the patient were reviewed by me and considered in my medical decision making (see chart for details).      Final Clinical Impressions(s) / UC Diagnoses   Final diagnoses:  Acute serous otitis media of left ear, recurrence not specified  Anterior cervical lymphadenopathy     Discharge Instructions     Follow up with ENT if lymphadenopathy does not resolve    ED Prescriptions    Medication Sig Dispense Auth. Provider   amoxicillin (AMOXIL) 875 MG tablet Take 1 tablet (875 mg total) by mouth 2 (two) times daily. 20 tablet Norval Gable, MD      1. diagnosis  reviewed with patient 2. rx as per orders above; reviewed possible side effects, interactions, risks and benefits  3. Recommend supportive treatment with otc analgesics prn 4. Follow-up with ENT if lymphadenopathy does not resolve 5. Follow up prn   nPDMP not reviewed this encounter.   Norval Gable, MD 10/30/18 1249

## 2018-11-09 ENCOUNTER — Other Ambulatory Visit: Payer: Self-pay | Admitting: Physician Assistant

## 2018-11-09 DIAGNOSIS — R221 Localized swelling, mass and lump, neck: Secondary | ICD-10-CM

## 2018-11-14 ENCOUNTER — Ambulatory Visit: Payer: Medicare Other | Attending: Anesthesiology | Admitting: Anesthesiology

## 2018-11-14 ENCOUNTER — Encounter: Payer: Self-pay | Admitting: Anesthesiology

## 2018-11-14 ENCOUNTER — Other Ambulatory Visit: Payer: Self-pay

## 2018-11-14 DIAGNOSIS — M51369 Other intervertebral disc degeneration, lumbar region without mention of lumbar back pain or lower extremity pain: Secondary | ICD-10-CM

## 2018-11-14 DIAGNOSIS — M1712 Unilateral primary osteoarthritis, left knee: Secondary | ICD-10-CM | POA: Diagnosis not present

## 2018-11-14 DIAGNOSIS — M25551 Pain in right hip: Secondary | ICD-10-CM

## 2018-11-14 DIAGNOSIS — M5442 Lumbago with sciatica, left side: Secondary | ICD-10-CM

## 2018-11-14 DIAGNOSIS — M48062 Spinal stenosis, lumbar region with neurogenic claudication: Secondary | ICD-10-CM

## 2018-11-14 DIAGNOSIS — F1190 Opioid use, unspecified, uncomplicated: Secondary | ICD-10-CM

## 2018-11-14 DIAGNOSIS — M1711 Unilateral primary osteoarthritis, right knee: Secondary | ICD-10-CM | POA: Diagnosis not present

## 2018-11-14 DIAGNOSIS — M5136 Other intervertebral disc degeneration, lumbar region: Secondary | ICD-10-CM | POA: Diagnosis not present

## 2018-11-14 DIAGNOSIS — M19011 Primary osteoarthritis, right shoulder: Secondary | ICD-10-CM

## 2018-11-14 DIAGNOSIS — C642 Malignant neoplasm of left kidney, except renal pelvis: Secondary | ICD-10-CM

## 2018-11-14 DIAGNOSIS — G894 Chronic pain syndrome: Secondary | ICD-10-CM

## 2018-11-14 DIAGNOSIS — M25552 Pain in left hip: Secondary | ICD-10-CM

## 2018-11-14 DIAGNOSIS — M5441 Lumbago with sciatica, right side: Secondary | ICD-10-CM

## 2018-11-14 DIAGNOSIS — G8929 Other chronic pain: Secondary | ICD-10-CM

## 2018-11-14 MED ORDER — OXYCODONE-ACETAMINOPHEN 10-325 MG PO TABS: 1.0000 | ORAL_TABLET | Freq: Four times a day (QID) | ORAL | 0 refills | Status: DC | PRN

## 2018-11-14 MED ORDER — OXYCODONE-ACETAMINOPHEN 10-325 MG PO TABS: 1.0000 | ORAL_TABLET | Freq: Four times a day (QID) | ORAL | 0 refills | Status: AC | PRN

## 2018-11-14 MED ORDER — GABAPENTIN 300 MG PO CAPS: 600.0000 mg | ORAL_CAPSULE | Freq: Three times a day (TID) | ORAL | 0 refills | Status: DC

## 2018-11-14 NOTE — Progress Notes (Signed)
Virtual Visit via Telephone Note  I connected with Wendy Sanchez on 11/14/18 at  2:30 PM EST by telephone and verified that I am speaking with the correct person using two identifiers.  Location: Patient: Home Provider: Pain control center   I discussed the limitations, risks, security and privacy concerns of performing an evaluation and management service by telephone and the availability of in person appointments. I also discussed with the patient that there may be a patient responsible charge related to this service. The patient expressed understanding and agreed to proceed.   History of Present Illness: I spoke with Wendy Sanchez via telephone conferencing secondary to the Covid crisis.  This was for her 51-month return and she states that she has been doing reasonably well with her low back pain and leg pain and it has been of the usual nature.  No changes in lower extremity strength function or bowel or bladder function are noted.  She has done better taking the 10 mg strength oxycodone.  This is been more effective especially since she had to discontinue the aspirin and NSAID therapy.  She has recently been treated for a knot in the cervical region presumed to be an inflamed node and started on antibiotics for this and this seems to be improving.  In regards to her low back pain it has been stable in nature and she is continuing to derive good functional benefit from the medications with no side effects reported today.  Otherwise she is trying to do stretching strengthening exercises and trying to lose weight with limited success.    Observations/Objective: Current Outpatient Medications:  .  albuterol (PROVENTIL HFA;VENTOLIN HFA) 108 (90 Base) MCG/ACT inhaler, Inhale 2 puffs into the lungs every 6 (six) hours as needed for wheezing or shortness of breath., Disp: , Rfl:  .  amoxicillin (AMOXIL) 875 MG tablet, Take 1 tablet (875 mg total) by mouth 2 (two) times daily., Disp: 20 tablet, Rfl: 0 .   apixaban (ELIQUIS) 5 MG TABS tablet, Take 2 tablets (10 mg total) by mouth 2 (two) times daily for 6 days., Disp: 24 tablet, Rfl: 0 .  apixaban (ELIQUIS) 5 MG TABS tablet, Take 1 tablet (5 mg total) by mouth 2 (two) times daily., Disp: 60 tablet, Rfl: 0 .  aspirin EC 81 MG tablet, Take 1 tablet (81 mg total) daily by mouth., Disp: 90 tablet, Rfl: 3 .  azelastine (ASTELIN) 0.1 % nasal spray, Place into both nostrils 2 (two) times daily. Use in each nostril as directed, Disp: , Rfl:  .  carvedilol (COREG) 6.25 MG tablet, Take 1 tablet (6.25 mg total) by mouth 2 (two) times daily., Disp: 180 tablet, Rfl: 3 .  cyclobenzaprine (FLEXERIL) 10 MG tablet, Take 1 tablet (10 mg total) by mouth 3 (three) times daily as needed. for muscle spams, Disp: 270 tablet, Rfl: 1 .  diclofenac sodium (VOLTAREN) 1 % GEL, Apply 2 g topically 4 (four) times daily as needed (pain). , Disp: , Rfl:  .  diphenhydrAMINE (BENADRYL) 25 mg capsule, Take 25 mg by mouth every 8 (eight) hours as needed for allergies. , Disp: , Rfl:  .  docusate sodium (COLACE) 100 MG capsule, Take 200 mg by mouth at bedtime. , Disp: , Rfl:  .  ELDERBERRY PO, Take 1 tablet by mouth daily., Disp: , Rfl:  .  fluconazole (DIFLUCAN) 100 MG tablet, Take by mouth., Disp: , Rfl:  .  folic acid (FOLVITE) 1 MG tablet, Take 1 mg by mouth at  bedtime. , Disp: , Rfl:  .  furosemide (LASIX) 40 MG tablet, Take 2 tablets (80 mg) by mouth once daily as directed, Disp: 180 tablet, Rfl: 3 .  gabapentin (NEURONTIN) 300 MG capsule, Take 2 capsules (600 mg total) by mouth 3 (three) times daily., Disp: 540 capsule, Rfl: 0 .  guaiFENesin (MUCINEX) 600 MG 12 hr tablet, Take 1,200 mg by mouth 2 (two) times daily., Disp: , Rfl:  .  levocetirizine (XYZAL) 5 MG tablet, Take 5 mg by mouth at bedtime. , Disp: , Rfl:  .  mirabegron ER (MYRBETRIQ) 50 MG TB24 tablet, Take 50 mg by mouth daily., Disp: , Rfl:  .  montelukast (SINGULAIR) 10 MG tablet, Take 10 mg by mouth at bedtime., Disp:  , Rfl:  .  Multiple Vitamins-Minerals (MULTIVITAMIN GUMMIES ADULTS) CHEW, Chew 2 tablets by mouth daily. , Disp: , Rfl:  .  nortriptyline (PAMELOR) 50 MG capsule, Take 50 mg by mouth at bedtime., Disp: , Rfl:  .  oxyCODONE-acetaminophen (PERCOCET) 10-325 MG tablet, Take 1 tablet by mouth every 6 (six) hours as needed for pain., Disp: 120 tablet, Rfl: 0 .  [START ON 12/14/2018] oxyCODONE-acetaminophen (PERCOCET) 10-325 MG tablet, Take 1 tablet by mouth every 6 (six) hours as needed for pain., Disp: 120 tablet, Rfl: 0 .  pantoprazole (PROTONIX) 40 MG tablet, Take 40 mg by mouth 2 (two) times daily., Disp: , Rfl:  .  potassium chloride SA (K-DUR,KLOR-CON) 20 MEQ tablet, Take 20 mEq by mouth See admin instructions. Take 20 meq every third day, Disp: , Rfl:  .  Probiotic Product (PROBIOTIC COLON SUPPORT) CAPS, Take 1 capsule by mouth at bedtime., Disp: , Rfl:  .  sacubitril-valsartan (ENTRESTO) 24-26 MG, Take 1 tablet by mouth 2 (two) times daily., Disp: 180 tablet, Rfl: 3 .  sucralfate (CARAFATE) 1 G tablet, Take 1 g by mouth 2 (two) times daily as needed (esophageal burning). , Disp: , Rfl:  .  umeclidinium-vilanterol (ANORO ELLIPTA) 62.5-25 MCG/INH AEPB, Inhale 1 puff into the lungs daily., Disp: , Rfl:  .  vitamin C (ASCORBIC ACID) 500 MG tablet, Take 500 mg by mouth daily., Disp: , Rfl:  .  Vitamin D, Ergocalciferol, (DRISDOL) 50000 UNITS CAPS capsule, Take 50,000 Units by mouth every 7 (seven) days. thursdays, Disp: , Rfl:    Assessment and Plan:dermatitis 1. Unilateral primary osteoarthritis, right knee   2. Osteoarthritis of left knee, unspecified osteoarthritis type   3. DDD (degenerative disc disease), lumbar   4. Chronic bilateral low back pain with bilateral sciatica   5. Hip pain, bilateral   6. Chronic, continuous use of opioids   7. Chronic pain syndrome   8. Primary osteoarthritis of right shoulder   9. Spinal stenosis of lumbar region with neurogenic claudication   10. Chronic  pain of right hip   11. 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  She has been taking the 10 mg Percocet tablets 4 times a day and this has been working well for her.  She denies any diverting or illicit use and has been compliant with the regimen.  She is scheduled for refill today and a month from today for 120 tablets and this will be performed.  I am scheduling her for return to clinic in 2 months.  She is instructed to contact us at the pain control center should she have any problems in the meantime and continue  follow-up with her primary care physicians for her baseline medical care.  We will also refill her gabapentin for a 41-month supply.   Follow Up Instructions:    I discussed the assessment and treatment plan with the patient. The patient was provided an opportunity to ask questions and all were answered. The patient agreed with the plan and demonstrated an understanding of the instructions.   The patient was advised to call back or seek an in-person evaluation if the symptoms worsen or if the condition fails to improve as anticipated.  I provided 30 minutes of non-face-to-face time during this encounter.   Molli Barrows, MD Home

## 2018-11-21 ENCOUNTER — Other Ambulatory Visit: Payer: Self-pay

## 2018-11-21 ENCOUNTER — Ambulatory Visit
Admission: RE | Admit: 2018-11-21 | Discharge: 2018-11-21 | Disposition: A | Discharge status: Home / Self Care | Payer: Medicare Other | Source: Ambulatory Visit | Attending: Physician Assistant | Admitting: Physician Assistant

## 2018-11-21 DIAGNOSIS — R221 Localized swelling, mass and lump, neck: Secondary | ICD-10-CM | POA: Diagnosis present

## 2018-11-21 MED ORDER — GADOBUTROL 1 MMOL/ML IV SOLN
10.0000 mL | Freq: Once | INTRAVENOUS | Status: AC | PRN
  Administered 2018-11-21: 13:00:00 10 mL via INTRAVENOUS

## 2018-11-22 ENCOUNTER — Other Ambulatory Visit: Payer: Self-pay | Admitting: Family Medicine

## 2018-11-22 DIAGNOSIS — Z1231 Encounter for screening mammogram for malignant neoplasm of breast: Secondary | ICD-10-CM

## 2018-11-24 ENCOUNTER — Telehealth: Payer: Self-pay | Admitting: Internal Medicine

## 2018-11-24 NOTE — Telephone Encounter (Signed)
   Primary Electrophysiologist: Yolonda Kida, MD  Chart reviewed as part of pre-operative protocol coverage. Patient follows with Dr. Caryl Comes for EP care. It appears she follows with Dr. Clayborn Bigness for general cardiology care and management of her eliquis for history of PE. Therefore, will defer preoperative assessment and recommendations for holding eliquis in the preoperative setting to Dr. Clayborn Bigness.  I will route this recommendation to the requesting party via Epic fax function and remove from pre-op pool.  Please call with questions.  Abigail Butts, PA-C 11/24/2018, 11:16 AM

## 2018-11-24 NOTE — Telephone Encounter (Signed)
° °  [facility] Pre-operative Risk Assessment    Request for surgical clearance:  1. What type of surgery is being performed? Left neck mass biopsy under ultrasound   2. When is this surgery scheduled? TBD  3. What type of clearance is required (medical clearance vs. Pharmacy clearance to hold med vs. Both)? both  4. Are there any medications that need to be held prior to surgery and how long? Hold Eliquis for 48 hours     5. Practice name and name of physician performing surgery? [facility]   6. What is your office phone number [phone]   7.   What is your office fax number [phone]  8.   Anesthesia type (None, local, MAC, general) ? Not listed    Wendy Sanchez 11/24/2018, 10:38 AM  _________________________________________________________________   (provider comments below)

## 2018-11-25 ENCOUNTER — Other Ambulatory Visit: Payer: Self-pay | Admitting: Physician Assistant

## 2018-11-25 DIAGNOSIS — R221 Localized swelling, mass and lump, neck: Secondary | ICD-10-CM

## 2018-12-06 ENCOUNTER — Other Ambulatory Visit: Payer: Self-pay | Admitting: Radiology

## 2018-12-07 ENCOUNTER — Other Ambulatory Visit: Payer: Self-pay

## 2018-12-07 ENCOUNTER — Ambulatory Visit
Admission: RE | Admit: 2018-12-07 | Discharge: 2018-12-07 | Disposition: A | Discharge status: Home / Self Care | Payer: Medicare Other | Source: Ambulatory Visit | Attending: Physician Assistant | Admitting: Physician Assistant

## 2018-12-07 DIAGNOSIS — R221 Localized swelling, mass and lump, neck: Secondary | ICD-10-CM | POA: Diagnosis present

## 2018-12-07 MED ORDER — SODIUM CHLORIDE 0.9 % IV SOLN: INTRAVENOUS | Status: DC

## 2018-12-07 NOTE — Discharge Instructions (Signed)
Needle Biopsy, Care After °These instructions tell you how to care for yourself after your procedure. Your doctor may also give you more specific instructions. Call your doctor if you have any problems or questions. °What can I expect after the procedure? °After the procedure, it is common to have: °· Soreness. °· Bruising. °· Mild pain. °Follow these instructions at home: ° °· Return to your normal activities as told by your doctor. Ask your doctor what activities are safe for you. °· Take over-the-counter and prescription medicines only as told by your doctor. °· Wash your hands with soap and water before you change your bandage (dressing). If you cannot use soap and water, use hand sanitizer. °· Follow instructions from your doctor about: °? How to take care of your puncture site. °? When and how to change your bandage. °? When to remove your bandage. °· Check your puncture site every day for signs of infection. Watch for: °? Redness, swelling, or pain. °? Fluid or blood.  °? Pus or a bad smell. °? Warmth. °· Do not take baths, swim, or use a hot tub until your doctor approves. Ask your doctor if you may take showers. You may only be allowed to take sponge baths. °· Keep all follow-up visits as told by your doctor. This is important. °Contact a doctor if you have: °· A fever. °· Redness, swelling, or pain at the puncture site, and it lasts longer than a few days. °· Fluid, blood, or pus coming from the puncture site. °· Warmth coming from the puncture site. °Get help right away if: °· You have a lot of bleeding from the puncture site. °Summary °· After the procedure, it is common to have soreness, bruising, or mild pain at the puncture site. °· Check your puncture site every day for signs of infection, such as redness, swelling, or pain. °· Get help right away if you have severe bleeding from your puncture site. °This information is not intended to replace advice given to you by your health care provider. Make  sure you discuss any questions you have with your health care provider. °Document Released: 12/12/2007 Document Revised: 01/11/2017 Document Reviewed: 01/11/2017 °Elsevier Patient Education © 2020 [employer] ° °

## 2018-12-09 LAB — CYTOLOGY - NON PAP

## 2018-12-15 ENCOUNTER — Other Ambulatory Visit: Payer: Self-pay | Admitting: Anesthesiology

## 2019-01-02 ENCOUNTER — Other Ambulatory Visit: Payer: Self-pay | Admitting: Anesthesiology

## 2019-01-11 ENCOUNTER — Encounter: Payer: Self-pay | Admitting: Anesthesiology

## 2019-01-11 ENCOUNTER — Other Ambulatory Visit: Payer: Self-pay

## 2019-01-11 ENCOUNTER — Ambulatory Visit: Payer: Medicare Other | Attending: Anesthesiology | Admitting: Anesthesiology

## 2019-01-11 DIAGNOSIS — M5136 Other intervertebral disc degeneration, lumbar region: Secondary | ICD-10-CM | POA: Diagnosis not present

## 2019-01-11 DIAGNOSIS — F1190 Opioid use, unspecified, uncomplicated: Secondary | ICD-10-CM

## 2019-01-11 DIAGNOSIS — M25551 Pain in right hip: Secondary | ICD-10-CM

## 2019-01-11 DIAGNOSIS — M5442 Lumbago with sciatica, left side: Secondary | ICD-10-CM

## 2019-01-11 DIAGNOSIS — M1711 Unilateral primary osteoarthritis, right knee: Secondary | ICD-10-CM | POA: Diagnosis not present

## 2019-01-11 DIAGNOSIS — M25552 Pain in left hip: Secondary | ICD-10-CM

## 2019-01-11 DIAGNOSIS — M48062 Spinal stenosis, lumbar region with neurogenic claudication: Secondary | ICD-10-CM

## 2019-01-11 DIAGNOSIS — M5441 Lumbago with sciatica, right side: Secondary | ICD-10-CM

## 2019-01-11 DIAGNOSIS — G894 Chronic pain syndrome: Secondary | ICD-10-CM

## 2019-01-11 DIAGNOSIS — M1712 Unilateral primary osteoarthritis, left knee: Secondary | ICD-10-CM

## 2019-01-11 DIAGNOSIS — G8929 Other chronic pain: Secondary | ICD-10-CM

## 2019-01-11 MED ORDER — OXYCODONE-ACETAMINOPHEN 10-325 MG PO TABS: 1.0000 | ORAL_TABLET | ORAL | 0 refills | Status: AC | PRN

## 2019-01-11 MED ORDER — GABAPENTIN 300 MG PO CAPS: 600.0000 mg | ORAL_CAPSULE | Freq: Three times a day (TID) | ORAL | 0 refills | Status: DC

## 2019-01-11 MED ORDER — OXYCODONE-ACETAMINOPHEN 10-325 MG PO TABS: 1.0000 | ORAL_TABLET | Freq: Four times a day (QID) | ORAL | 0 refills | Status: DC | PRN

## 2019-01-11 NOTE — Progress Notes (Signed)
Virtual Visit via Telephone Note  I connected with Wendy Sanchez on 01/11/19 at  9:45 AM EST by telephone and verified that I am speaking with the correct person using two identifiers.  Location: Patient: Home Provider: [facility]   I discussed the limitations, risks, security and privacy concerns of performing an evaluation and management service by telephone and the availability of in person appointments. I also discussed with the patient that there may be a patient responsible charge related to this service. The patient expressed understanding and agreed to proceed.   History of Present Illness: I spoke with Wendy Sanchez via telephone as she was not able to do the video portion of the virtual conference.  She states that she is having a lot more pain recently and has actually been seen by her rheumatologist and has been worked up for Sjogren's disease.  The findings of this are still pending but she has been using her opioid medications occasionally up to 5 times a day secondary to the severity of pain.  She states that at times she is driven to tears and has had to eliminate her nonsteroidal anti-inflammatory drugs per request of her primary care physicians.  Based on our discussion today the opioids continue to give her good relief however she is having frequent breakthrough pain.  The quality characteristic distribution of this remained stable without significant change primarily affecting the left knee bilateral hips low back and occasionally shoulders and hands.    Observations/Objective:  Current Outpatient Medications:  .  albuterol (PROVENTIL HFA;VENTOLIN HFA) 108 (90 Base) MCG/ACT inhaler, Inhale 2 puffs into the lungs every 6 (six) hours as needed for wheezing or shortness of breath., Disp: , Rfl:  .  amoxicillin (AMOXIL) 875 MG tablet, Take 1 tablet (875 mg total) by mouth 2 (two) times daily., Disp: 20 tablet, Rfl: 0 .  apixaban (ELIQUIS) 5 MG TABS tablet, Take 2 tablets  (10 mg total) by mouth 2 (two) times daily for 6 days., Disp: 24 tablet, Rfl: 0 .  apixaban (ELIQUIS) 5 MG TABS tablet, Take 1 tablet (5 mg total) by mouth 2 (two) times daily., Disp: 60 tablet, Rfl: 0 .  aspirin EC 81 MG tablet, Take 1 tablet (81 mg total) daily by mouth., Disp: 90 tablet, Rfl: 3 .  azelastine (ASTELIN) 0.1 % nasal spray, Place into both nostrils 2 (two) times daily. Use in each nostril as directed, Disp: , Rfl:  .  carvedilol (COREG) 6.25 MG tablet, Take 1 tablet (6.25 mg total) by mouth 2 (two) times daily., Disp: 180 tablet, Rfl: 3 .  cyclobenzaprine (FLEXERIL) 10 MG tablet, Take 1 tablet (10 mg total) by mouth 3 (three) times daily as needed. for muscle spams, Disp: 270 tablet, Rfl: 1 .  diclofenac sodium (VOLTAREN) 1 % GEL, Apply 2 g topically 4 (four) times daily as needed (pain). , Disp: , Rfl:  .  diphenhydrAMINE (BENADRYL) 25 mg capsule, Take 25 mg by mouth every 8 (eight) hours as needed for allergies. , Disp: , Rfl:  .  docusate sodium (COLACE) 100 MG capsule, Take 200 mg by mouth at bedtime. , Disp: , Rfl:  .  ELDERBERRY PO, Take 1 tablet by mouth daily., Disp: , Rfl:  .  folic acid (FOLVITE) 1 MG tablet, Take 1 mg by mouth at bedtime. , Disp: , Rfl:  .  furosemide (LASIX) 40 MG tablet, Take 2 tablets (80 mg) by mouth once daily as directed, Disp: 180 tablet, Rfl: 3 .  gabapentin (NEURONTIN) 300 MG capsule, Take 2 capsules (600 mg total) by mouth 3 (three) times daily., Disp: 540 capsule, Rfl: 0 .  guaiFENesin (MUCINEX) 600 MG 12 hr tablet, Take 1,200 mg by mouth 2 (two) times daily., Disp: , Rfl:  .  levocetirizine (XYZAL) 5 MG tablet, Take 5 mg by mouth at bedtime. , Disp: , Rfl:  .  mirabegron ER (MYRBETRIQ) 50 MG TB24 tablet, Take 50 mg by mouth daily., Disp: , Rfl:  .  montelukast (SINGULAIR) 10 MG tablet, Take 10 mg by mouth at bedtime., Disp: , Rfl:  .  Multiple Vitamins-Minerals (MULTIVITAMIN GUMMIES ADULTS) CHEW, Chew 2 tablets by mouth daily. , Disp: , Rfl:   .  nortriptyline (PAMELOR) 50 MG capsule, Take 50 mg by mouth at bedtime., Disp: , Rfl:  .  [START ON 01/14/2019] oxyCODONE-acetaminophen (PERCOCET) 10-325 MG tablet, Take 1 tablet by mouth every 4 (four) hours as needed for pain., Disp: 150 tablet, Rfl: 0 .  [START ON 02/13/2019] oxyCODONE-acetaminophen (PERCOCET) 10-325 MG tablet, Take 1 tablet by mouth every 6 (six) hours as needed for pain., Disp: 120 tablet, Rfl: 0 .  pantoprazole (PROTONIX) 40 MG tablet, Take 40 mg by mouth 2 (two) times daily., Disp: , Rfl:  .  potassium chloride SA (K-DUR,KLOR-CON) 20 MEQ tablet, Take 20 mEq by mouth See admin instructions. Take 20 meq every third day, Disp: , Rfl:  .  Probiotic Product (PROBIOTIC COLON SUPPORT) CAPS, Take 1 capsule by mouth at bedtime., Disp: , Rfl:  .  sacubitril-valsartan (ENTRESTO) 24-26 MG, Take 1 tablet by mouth 2 (two) times daily., Disp: 180 tablet, Rfl: 3 .  sucralfate (CARAFATE) 1 G tablet, Take 1 g by mouth 2 (two) times daily as needed (esophageal burning). , Disp: , Rfl:  .  umeclidinium-vilanterol (ANORO ELLIPTA) 62.5-25 MCG/INH AEPB, Inhale 1 puff into the lungs daily., Disp: , Rfl:  .  vitamin C (ASCORBIC ACID) 500 MG tablet, Take 500 mg by mouth daily., Disp: , Rfl:  .  Vitamin D, Ergocalciferol, (DRISDOL) 50000 UNITS CAPS capsule, Take 50,000 Units by mouth every 7 (seven) days. thursdays, Disp: , Rfl:   Assessment and Plan: 1. Unilateral primary osteoarthritis, right knee   2. DDD (degenerative disc disease), lumbar   3. Chronic bilateral low back pain with bilateral sciatica   4. Hip pain, bilateral   5. Chronic, continuous use of opioids   6. Chronic pain syndrome   7. Osteoarthritis of left knee, unspecified osteoarthritis type   8. Spinal stenosis of lumbar region with neurogenic claudication   9. Primary osteoarthritis of left knee   Based on our discussion today I feel it appropriate to refill her medications and I have reviewed the [facility] practitioner  database information.  For her next renewal on January 2 I am going to to increase this to 150 tablets for every 4-hour to 6-hour dosing.  This will be as needed based and I encouraged her to continue with the 4 times a day dosing if possible.  We have also talked about the acetaminophen total dosing regimen.  I want her to make sure that she is not taking any acetaminophen containing compounds in addition to her current regimen.  We will schedule her for 21-month return to clinic and I encouraged her to continue follow-up with her rheumatologist to further evaluate Sjogren's and other potential avenues for treatment. Follow Up Instructions:    I discussed the assessment and treatment plan with the patient. The patient was provided  an opportunity to ask questions and all were answered. The patient agreed with the plan and demonstrated an understanding of the instructions.   The patient was advised to call back or seek an in-person evaluation if the symptoms worsen or if the condition fails to improve as anticipated.  I provided 30 minutes of non-face-to-face time during this encounter.   Molli Barrows, MD

## 2019-01-25 ENCOUNTER — Other Ambulatory Visit: Payer: Self-pay | Admitting: Physician Assistant

## 2019-01-25 DIAGNOSIS — R221 Localized swelling, mass and lump, neck: Secondary | ICD-10-CM

## 2019-02-03 ENCOUNTER — Other Ambulatory Visit: Payer: Self-pay

## 2019-02-03 ENCOUNTER — Ambulatory Visit
Admission: RE | Admit: 2019-02-03 | Discharge: 2019-02-03 | Disposition: A | Discharge status: Home / Self Care | Payer: Medicare Other | Source: Ambulatory Visit | Attending: Physician Assistant | Admitting: Physician Assistant

## 2019-02-03 DIAGNOSIS — R221 Localized swelling, mass and lump, neck: Secondary | ICD-10-CM | POA: Diagnosis not present

## 2019-02-03 MED ORDER — GADOBUTROL 1 MMOL/ML IV SOLN
10.0000 mL | Freq: Once | INTRAVENOUS | Status: AC | PRN
  Administered 2019-02-03: 10 mL via INTRAVENOUS

## 2019-02-20 ENCOUNTER — Encounter: Payer: Self-pay | Admitting: Otolaryngology

## 2019-02-20 ENCOUNTER — Other Ambulatory Visit: Payer: Self-pay

## 2019-02-22 ENCOUNTER — Other Ambulatory Visit
Admission: RE | Admit: 2019-02-22 | Discharge: 2019-02-22 | Disposition: A | Discharge status: Home / Self Care | Payer: Medicare Other | Source: Ambulatory Visit | Attending: Otolaryngology | Admitting: Otolaryngology

## 2019-02-22 ENCOUNTER — Other Ambulatory Visit: Payer: Self-pay

## 2019-02-22 DIAGNOSIS — Z20822 Contact with and (suspected) exposure to covid-19: Secondary | ICD-10-CM | POA: Insufficient documentation

## 2019-02-22 DIAGNOSIS — Z01812 Encounter for preprocedural laboratory examination: Secondary | ICD-10-CM | POA: Diagnosis present

## 2019-02-22 LAB — SARS CORONAVIRUS 2 (TAT 6-24 HRS): SARS Coronavirus 2: NEGATIVE

## 2019-02-24 NOTE — Pre-Procedure Instructions (Signed)
Progress Notes - documented in this encounter Jobe Gibbon, MD - 02/07/2019 11:00 AM EST Formatting of this note might be different from the original. Established Patient Visit   Chief Complaint: Chief Complaint  Patient presents with  . Follow-up  Swelling in hands and feet  . Chest Pain  From left side to center, does not happen frequently  Date of Service: 02/07/2019 Date of Birth: 1950-09-22 PCP: Lovie Macadamia, MD  History of Present Illness: Ms. Lahrman is a 69 y.o.female patient who history of congestive heart failure chronic with systolic dysfunction severe ejection fraction around 25% nonischemic cardiomyopathy history of DVT PE edema obesity obstructive sleep apnea COPD smoking r DJD of the knees with significant limitations. Patient still on Eliquis for anticoagulation denies any episodes of atrial fibrillation. Patient is being worked up for possible Sjogren's been evaluated by rheumatology complains of generalized pain here for further cardiac evaluation  Past Medical and Surgical History  Past Medical History Past Medical History:  Diagnosis Date  . Asthma  . Chickenpox  . Cigarette smoker  . COPD (chronic obstructive pulmonary disease) (CMS-HCC)  . Essential hypertension, benign  . Generalized abdominal pain  . Hemorrhoids  . Hx of migraines  . Obesity  . Osteoarthritis ([place]) 11/14/2013  a. Hands. b. Knees.  . Other and unspecified hyperlipidemia  . Pulmonary HTN (CMS-HCC)  . Seasonal allergies   Past Surgical History She has a past surgical history that includes Abdominal hysterectomy w/ partial vaginactomy (1977); Gastric bypass open (1982); Arthroscopic Rotator Cuff Repair (03/2007); LEFT WRIST SURGERY (1980); LEFT KNEE SURGERY (2006); Upper gastrointestinal endoscopy (04/18/13); Colonoscopy (04/18/13); Laparoscopic Gastroplasty Non-Vertical Banding For Obesity; Right ovariectomy; Joint replacement (Left, 08/02/2015); and biopsy on a nodule on her neck.    Medications and Allergies  Current Medications  Current Outpatient Medications  Medication Sig Dispense Refill  . albuterol 90 mcg/actuation inhaler Inhale 2 inhalations into the lungs every 6 (six) hours as needed for Wheezing 3 Inhaler 3  . apixaban (ELIQUIS) 5 mg tablet Take 1 tablet (5 mg total) by mouth every 12 (twelve) hours 180 tablet 3  . azelastine (ASTELIN) 137 mcg nasal spray Place 1 spray into both nostrils 2 (two) times daily 90 mL 2  . carvedilol (COREG) 6.25 MG tablet Take 3.125 mg by mouth 2 (two) times daily with meals  . cyclobenzaprine (FLEXERIL) 10 MG tablet Take 10 mg by mouth 3 (three) times daily as needed for Muscle spasms.  Marland Kitchen dextromethorphan-guaifenesin 60-1,200 mg per 12 hr tablet Take 1 tablet by mouth every 12 (twelve) hours  . diclofenac (VOLTAREN) 1 % topical gel Apply topically once daily 300 g 3  . diphenhydrAMINE (BENADRYL) 25 mg capsule Take 25 mg by mouth every 6 (six) hours as needed for Itching  . docusate (COLACE) 100 MG capsule Take 200 mg by mouth 2 (two) times daily.  . ergocalciferol, vitamin D2, 1,250 mcg (50,000 unit) capsule Take 1 capsule (50,000 Units total) by mouth once a week 12 capsule 3  . fluconazole (DIFLUCAN) 100 MG tablet TAKE 1 & 1 2 (ONE & ONE HALF) TABLETS BY MOUTH EVERY THIRD DAY AS NEEDED  . folic acid (FOLVITE) 1 MG tablet Take 1 tablet (1,000 mcg total) by mouth once daily 90 tablet 3  . FUROsemide (LASIX) 40 MG tablet Take 1 tablet (40 mg total) by mouth once daily as needed for Edema 60 tablet 2  . gabapentin (NEURONTIN) 300 MG capsule Take 2 capsules (600 mg total) by mouth 2 (  two) times daily. (Patient taking differently: Take 600 mg by mouth 3 (three) times daily.  ) 120 capsule 5  . ipratropium-albuteroL (DUO-NEB) nebulizer solution Take 3 mLs by nebulization 4 (four) times daily for 360 days 360 mL 11  . L. acidophilus-L. rhamnosus (PROBIOTIC) 15 billion cell Cap Take by mouth nightly.  . levocetirizine (XYZAL) 5 MG  tablet Take 1 tablet (5 mg total) by mouth every evening 90 tablet 3  . mirabegron (MYRBETRIQ) 50 mg ER tablet Take 1 tablet (50 mg total) by mouth once daily 90 tablet 3  . montelukast (SINGULAIR) 10 mg tablet Take 1 tablet (10 mg total) by mouth once daily 90 tablet 3  . MULTIVITAMIN ORAL Take by mouth 2 (two) times daily.  . nortriptyline (PAMELOR) 50 MG capsule Take 1 capsule (50 mg total) by mouth nightly 90 capsule 3  . nystatin (MYCOSTATIN) 100,000 unit/gram cream Apply topically 2 (two) times daily 90 g 3  . nystatin (MYCOSTATIN) 100,000 unit/mL suspension SWISH & SWALLOW 5 ML 4 TIMES DAILY FOR 7 DAYS 140 mL 0  . oxyCODONE-acetaminophen (PERCOCET) 7.5-325 mg tablet Take 1 tablet by mouth every 6 (six) hours as needed  . pantoprazole (PROTONIX) 40 MG DR tablet Take 1 tablet (40 mg total) by mouth 2 (two) times daily 60 tablet 11  . pilocarpine (SALAGEN) 5 mg tablet One tab twice a day, 30 days, 60 tabs 60 tablet 11  . potassium chloride (KLOR-CON M20) 20 MEQ ER tablet Take 1 tablet (20 mEq total) by mouth once daily 90 tablet 3  . sucralfate (CARAFATE) 1 gram tablet Take 1 tablet (1 g total) by mouth 2 (two) times daily before meals 180 tablet 3  . umeclidinium-vilanteroL (ANORO ELLIPTA) 62.5-25 mcg/actuation inhaler Inhale 1 inhalation into the lungs once daily (Patient taking differently: Inhale 1 inhalation into the lungs once daily Uses about every other day. ) 3 Inhaler 2  . XYLITOL MM by Mucous Membrane route  . FUROsemide (LASIX) 40 MG tablet Take 1 tablet (40 mg total) by mouth once daily. 30 tablet 11  . sacubitriL-valsartan (ENTRESTO) 49-51 mg tablet Take 1 tablet by mouth 2 (two) times daily 90 tablet 3   No current facility-administered medications for this visit.   Allergies: Adhesive tape-silicones, Iodine, Other, Vicodin [hydrocodone-acetaminophen], and Shellfish containing products  Social and Family History  Social History reports that she quit smoking about 2 years  ago. Her smoking use included cigarettes. She has a 50.00 pack-year smoking history. She has never used smokeless tobacco. She reports that she does not drink alcohol or use drugs.  Family History Family History  Problem Relation Age of Onset  . Hepatitis C Sister  . Lupus Sister  . Colon polyps Mother  . Colon polyps Father  . Lupus Brother  . Lupus Nephew  . Lupus Nephew  . Gout Brother  . Lupus Nephew  . Lupus Sister  . Gout Sister  . Diabetes Sister  . Arthritis Sister   Review of Systems   Review of Systems: The patient denies chest pain, shortness of breath, orthopnea, paroxysmal nocturnal dyspnea, pedal edema, palpitations, heart racing, presyncope, syncope. Review of 12 Systems is negative except as described above.  Physical Examination   Vitals:BP 120/78  Pulse 65  Ht 170.2 cm (5\' 7" )  Wt (!) 133.8 kg (295 lb)  SpO2 95%  BMI 46.20 kg/m  Ht:170.2 cm (5\' 7" ) Wt:(!) 133.8 kg (295 lb) [id number] surface area is 2.51 meters squared. Body mass index is 46.2  kg/m.  HEENT: Pupils equally reactive to light and accomodation  Neck: Supple without thyromegaly, carotid pulses 2+ Lungs: clear to auscultation bilaterally; no wheezes, rales, rhonchi Heart: Regular rate and rhythm. No gallops, murmurs or rub Abdomen: soft nontender, nondistended, with normal bowel sounds Extremities: no cyanosis, clubbing, or edema Peripheral Pulses: 2+ in all extremities, 2+ femoral pulses bilaterally  Assessment   69 y.o. female with  1. Cardiomyopathy, unspecified type (CMS-HCC)  2. SOB (shortness of breath)  3. Angina at rest (CMS-HCC)  4. Chronic obstructive pulmonary disease, unspecified COPD type (CMS-HCC)  5. Congestive heart failure with left ventricular systolic dysfunction (CMS-HCC)  6. Morbid obesity with BMI of 45.0-49.9 ([place])  7. OSA (obstructive sleep apnea)  8. Bradycardia  9. Frequent PVCs  10. H/O cardiac radiofrequency ablation  11. Pulmonary HTN (CMS-HCC)  12.  Pulmonary embolism, unspecified chronicity, unspecified pulmonary embolism type, unspecified whether acute cor pulmonale present (CMS-HCC)   Plan  1 cardiomyopathy nonischemic systolic severe continue Entresto continue diuretics Coreg 2 congestive heart failure systolic dysfunction continue heart failure therapy with Entresto beta-blocker  3 shortness of breath dyspnea related to heart failure cardiomyopathy continue diuretics heart failure therapy 4 DVT PE continue long-term anticoagulation therapy follow-up with hematology for recommendations on duration of anticoagulation 5 consider Entresto increase to 49/51 twice a day 6 productive sleep apnea by history recommend sleep study CPAP weight loss 7 COPD by history mild to moderate recommend inhalers consider follow-up with pulmonary 8 history of multiple PVCs status post ablation at [facility] with Dr. Caryl Comes 9 have the patient follow-up in 1 month  Return in about 1 month (around 03/10/2019).  DWAYNE Prince [place], MD  This dictation was prepared with dragon dictation. Any transcription errors that result from this process are unintentional.   Electronically signed by Jobe Gibbon, MD at 02/11/2019 7:48 PM EST

## 2019-02-24 NOTE — Pre-Procedure Instructions (Signed)
Progress Notes - documented in this encounter Table of Contents for Progress Notes  Erby Pian, MD - 02/23/2019 1:15 PM EST  Marja Kays, RT - 02/23/2019 1:15 PM EST    Erby Pian, MD - 02/23/2019 1:15 PM EST Formatting of this note might be different from the original.  Follow-up  History of Present Illness: Wendy Sanchez is a 69 y.o. female presents to clinic for copd, sleep apnea, not wearing her cpap, for bx next Tuesday per Ent as part of the [place]. See rheum notes.  No chest pain, no fever, wheezing or coughing. No headache. , no gerd, no rhinitis. Overall pulmonary wise is is at her baseline. Struggling to loose weight. Smoke off and on.   Current Medications:  Current Outpatient Medications  Medication Sig Dispense Refill  . albuterol 90 mcg/actuation inhaler Inhale 2 inhalations into the lungs every 6 (six) hours as needed for Wheezing 3 Inhaler 3  . apixaban (ELIQUIS) 5 mg tablet Take 1 tablet (5 mg total) by mouth every 12 (twelve) hours 180 tablet 3  . azelastine (ASTELIN) 137 mcg nasal spray Place 1 spray into both nostrils 2 (two) times daily 90 mL 2  . carvedilol (COREG) 6.25 MG tablet Take 3.125 mg by mouth 2 (two) times daily with meals  . cyclobenzaprine (FLEXERIL) 10 MG tablet Take 10 mg by mouth 3 (three) times daily as needed for Muscle spasms.  Marland Kitchen dextromethorphan-guaifenesin 60-1,200 mg per 12 hr tablet Take 1 tablet by mouth every 12 (twelve) hours  . diclofenac (VOLTAREN) 1 % topical gel Apply topically once daily 300 g 3  . diphenhydrAMINE (BENADRYL) 25 mg capsule Take 25 mg by mouth every 6 (six) hours as needed for Itching  . docusate (COLACE) 100 MG capsule Take 200 mg by mouth 2 (two) times daily.  . ergocalciferol, vitamin D2, 1,250 mcg (50,000 unit) capsule Take 1 capsule (50,000 Units total) by mouth once a week 12 capsule 3  . fluconazole (DIFLUCAN) 100 MG tablet TAKE 1 & 1 2 (ONE & ONE HALF) TABLETS BY MOUTH EVERY THIRD DAY AS NEEDED   . folic acid (FOLVITE) 1 MG tablet Take 1 tablet (1,000 mcg total) by mouth once daily 90 tablet 3  . FUROsemide (LASIX) 40 MG tablet Take 1 tablet (40 mg total) by mouth once daily. 30 tablet 11  . FUROsemide (LASIX) 40 MG tablet Take 1 tablet (40 mg total) by mouth once daily as needed for Edema 60 tablet 2  . gabapentin (NEURONTIN) 300 MG capsule Take 2 capsules (600 mg total) by mouth 2 (two) times daily. (Patient taking differently: Take 600 mg by mouth 3 (three) times daily.  ) 120 capsule 5  . ipratropium-albuteroL (DUO-NEB) nebulizer solution Take 3 mLs by nebulization 4 (four) times daily for 360 days 360 mL 11  . L. acidophilus-L. rhamnosus (PROBIOTIC) 15 billion cell Cap Take by mouth nightly.  . levocetirizine (XYZAL) 5 MG tablet Take 1 tablet (5 mg total) by mouth every evening 90 tablet 3  . mirabegron (MYRBETRIQ) 50 mg ER tablet Take 1 tablet (50 mg total) by mouth once daily 90 tablet 3  . montelukast (SINGULAIR) 10 mg tablet Take 1 tablet (10 mg total) by mouth once daily 90 tablet 3  . MULTIVITAMIN ORAL Take by mouth 2 (two) times daily.  . nortriptyline (PAMELOR) 50 MG capsule Take 1 capsule (50 mg total) by mouth nightly 90 capsule 3  . nystatin (MYCOSTATIN) 100,000 unit/gram cream Apply topically 2 (two)  times daily 90 g 3  . nystatin (MYCOSTATIN) 100,000 unit/mL suspension SWISH & SWALLOW 5 ML 4 TIMES DAILY FOR 7 DAYS 140 mL 0  . oxyCODONE-acetaminophen (PERCOCET) 7.5-325 mg tablet Take 1 tablet by mouth every 6 (six) hours as needed  . pilocarpine (SALAGEN) 5 mg tablet One tab twice a day, 30 days, 60 tabs 60 tablet 11  . potassium chloride (KLOR-CON M20) 20 MEQ ER tablet Take 1 tablet (20 mEq total) by mouth once daily 90 tablet 3  . sacubitriL-valsartan (ENTRESTO) 49-51 mg tablet Take 1 tablet by mouth 2 (two) times daily 90 tablet 3  . umeclidinium-vilanteroL (ANORO ELLIPTA) 62.5-25 mcg/actuation inhaler Inhale 1 inhalation into the lungs once daily (Patient taking  differently: Inhale 1 inhalation into the lungs once daily Uses about every other day. ) 3 Inhaler 2  . XYLITOL MM by Mucous Membrane route   No current facility-administered medications for this visit.   Problem List:  Patient Active Problem List  Diagnosis  . Generalized abdominal pain  . Osteoarthritis ([place])  . Diarrhea  . Morbid obesity with BMI of 45.0-49.9 ([place])  . PVC (premature ventricular contraction)  . Pulmonary embolism (CMS-HCC)  . Dry mouth  . Bilateral dry eyes   History: Past Medical History:  Diagnosis Date  . Asthma  . Chickenpox  . Cigarette smoker  . COPD (chronic obstructive pulmonary disease) (CMS-HCC)  . Essential hypertension, benign  . Generalized abdominal pain  . Hemorrhoids  . Hx of migraines  . Obesity  . Osteoarthritis ([place]) 11/14/2013  a. Hands. b. Knees.  . Other and unspecified hyperlipidemia  . Pulmonary HTN (CMS-HCC)  . Seasonal allergies   Past Surgical History:  Procedure Laterality Date  . ABDOMINAL HYSTERECTOMY W/ PARTIAL VAGINACTOMY 1977  . ARTHROSCOPIC ROTATOR CUFF REPAIR 03/2007  . biopsy on a nodule on her neck  done by ENT  . COLONOSCOPY 04/18/13  06/15/2007. FMHX COLON POLYPS (mus)  . GASTRIC BYPASS OPEN 1982  . JOINT REPLACEMENT Left 08/02/2015  Dr Rush Farmer  . LAPAROSCOPIC GASTROPLASTY NON-VERTICAL BANDING FOR OBESITY  . LEFT KNEE SURGERY 2006  . LEFT WRIST SURGERY 1980  . Right ovariectomy  mostly done for prolapsed uterus  . UPPER GASTROINTESTINAL ENDOSCOPY 04/18/13  06/15/2007. (MUS)   Family History  Problem Relation Age of Onset  . Hepatitis C Sister  . Lupus Sister  . Colon polyps Mother  . Colon polyps Father  . Lupus Brother  . Lupus Nephew  . Lupus Nephew  . Gout Brother  . Lupus Nephew  . Lupus Sister  . Gout Sister  . Diabetes Sister  . Arthritis Sister   Social History   Socioeconomic History  . Marital status: Single  Spouse name: Not on file  . Number of children: Not on file  . Years of  education: Not on file  . Highest education level: Not on file  Occupational History  . Occupation: retired  Scientific laboratory technician  . Financial resource strain: Not on file  . Food insecurity  Worry: Not on file  Inability: Not on file  . Transportation needs  Medical: Not on file  Non-medical: Not on file  Tobacco Use  . Smoking status: Former Smoker  Packs/day: 1.00  Years: 50.00  Pack years: 50.00  Types: Cigarettes  Quit date: 08/21/2016  Years since quitting: 2.5  . Smokeless tobacco: Never Used  . Tobacco comment: pt stated "sneaked one last week"  Substance and Sexual Activity  . Alcohol use: No  . Drug  use: No  . Sexual activity: Never  Partners: Male  Other Topics Concern  . Not on file  Social History Narrative  . Not on file   Allergies:  Adhesive tape-silicones, Iodine, Other, Vicodin [hydrocodone-acetaminophen], and Shellfish containing products  Review of Systems: As per above. Pretty much unchanged, dyspnea no worse. No other associated cardiopulmonary, GI, GU, dermatological symptoms today. No focal neurological symptoms or psychological changes.   Physical Exam: BP 134/81  Pulse 70  Temp 37.3 C (99.1 F) (Oral)  Ht 167.6 cm (5\' 6" )  Wt (!) 132.8 kg (292 lb 12.8 oz)  SpO2 98%  BMI 47.26 kg/m (!) 132.8 kg (292 lb 12.8 oz) 98% General: NAD. Able to speak in complete sentences without cough or dyspnea, obese HEENT: Normocephalic, nontraumatic. Extraocular movements intact NECK: Supple. No JVD, nodes, thyromegaly CV: RRR no murmurs, gallops, rubs PULM: Normal respiratory effort, Clear to auscultation bilaterally without wheezing or crackles ABD: Obese EXTREMITIES: No significant edema, cyanosis or Homans'signs SKIN: Fair turgor. No rashes LYMPHATIC: No nodes NEURO: No gross deficits, no change PSYCH: Appropriate affect, alert, oriented   Diagnostics: SPIROMETRY: FVC was 1.90 liters, 76% of predicted FEV1 was 1.15, 57% of predicted FEV1 ratio was  60 FEF 25-75% liters per second was 20% of predicted *Patient didn't have Personal Albuterol Inhaler for Post, stated she hasn't taken in about a month.  LUNG VOLUMES: TLC was 94% of predicted RV was 146% of predicted  DIFFUSION CAPACITY: DLCO was 70% of predicted DLCO/VA was 130% of predicted  FLOW VOLUME LOOP: Flow volume loop is c/w obstruction  Impression Spirometry is c/w moderate obstruction RV is elevated c/w airtrapping  DLCO is mild- moderately decreased  *Compared to Previous Study, Spirometry and CARBON MONOXIDE DIFFUSING CAPACITY are essentially consistent. Patient stated she didn't have upper teeth. Patient did struggle with Lung Volumes this time and maintaining seal, stated she was biting down on mouthpiece. Patient's weight is down 13 lbs from Previous.  Impression:/Plan: Diagnoses and all orders for this visit:  DOE (dyspnea on exertion) - Carbon monoxide diffusing capacity (DLCO) - Flow Volume Loop - Lung Volumes   Shortness of breath, no worse, weight is a major contributor. He copd is stable on presentregimen. Ex smoker. Cleared for the ENT procedure.  Weight loss Continue anoro, albuterol, singulair 10 mg q day  She mild sleep apnea AHI 10, no significant oxygen desaturation.Not wearing herauto cpap, " it makes me sick.".No upper teeth, hence not a candidate for an oral appliance. Resting well Weight loss Avoid laying on her back     Electronically signed by Erby Pian, MD at 02/23/2019 1:27 PM EST  Back to top of Progress Notes Marja Kays, RT - 02/23/2019 1:15 PM EST Procedure Complete Pulmonary Function Test  Ordering Physician Wallene Huh, MD  Reason for PFT SHORTNESS OF BREATH in Ex Smoker COVID Negative @ [facility] for Pre-Op, (Taking Sample of Salivatory Gland to assess for Sjogren's) Temp 99.1, Screening Questions Negative  Findings Wendy Sanchez is a 69 y.o. female reports that she quit smoking about 2 years  ago. Her smoking use included cigarettes. She has a 50.00 pack-year smoking history. She has never used smokeless tobacco.  SPIROMETRY: FVC was 1.90 liters, 76% of predicted FEV1 was 1.15, 57% of predicted FEV1 ratio was 60 FEF 25-75% liters per second was 20% of predicted *Patient didn't have Personal Albuterol Inhaler for Post, stated she hasn't taken in about a month.  LUNG VOLUMES: TLC was 94% of predicted RV  was 146% of predicted  DIFFUSION CAPACITY: DLCO was 70% of predicted DLCO/VA was 130% of predicted  FLOW VOLUME LOOP: Flow volume loop is c/w obstruction  Impression Spirometry is c/w moderate obstruction RV is elevated c/w airtrapping  DLCO is mild- moderately decreased  *Compared to Previous Study, Spirometry and CARBON MONOXIDE DIFFUSING CAPACITY are essentially consistent. Patient stated she didn't have upper teeth. Patient did struggle with Lung Volumes this time and maintaining seal, stated she was biting down on mouthpiece. Patient's weight is down 13 lbs from Previous.    Electronically signed by Erby Pian, MD at 02/23/2019 1:27 PM EST  Back to top

## 2019-02-24 NOTE — Pre-Procedure Instructions (Signed)
Echo complete3/03/2018 [facility] Component Name Value Ref Range  LV Ejection Fraction (%) 35   Aortic Valve Regurgitation Grade trivial   Aortic Valve Stenosis Grade none   Aortic Valve Max Velocity (m/s) 1.5 m/sec  Aortic Valve Stenosis Mean Gradient (mmHg) 5.0 mmHg  Mitral Valve Regurgitation Grade mild   Mitral Valve Stenosis Grade none   Tricuspid Valve Regurgitation Grade mild   Tricuspid Valve Regurgitation Max Velocity (m/s) 2.5 m/sec  Right Ventricle Systolic Pressure (mmHg) 0000000 mmHg  LV End Diastolic Diameter (cm) 5.5 cm  LV End Systolic Diameter (cm) 4.7 cm  LV Septum Wall Thickness (cm) 1.5 cm  LV Posterior Wall Thickness (cm) 1 cm  Left Atrium Diameter (cm) 3.7 cm  Result Narrative            CARDIOLOGY DEPARTMENT          [facility]                  [id number]      [facility]              Acct #: [id number]      [place], [place], [place] [address]    Date: 03/10/2018 12: 71 PM                                Adult  Female  Age: 69 yrs      ECHOCARDIOGRAM REPORT               Outpatient                                [facility]    STUDY:CHEST WALL        TAPE:          MD1: CALLWOOD, DWAYNE DENNIS    ECHO:Yes  DOPPLER:Yes    FILE:          BP: 124/76 mmHg    COLOR:Yes  CONTRAST:No   MACHINE:Philips  RV BIOPSY:No     3D:No SOUND QLTY:Moderate      Height: 67 in   MEDIUM:None                       Weight: 297 lb                                BSA: 2.4 m2 _________________________________________________________________________________________        HISTORY: Cardiomyopathy         REASON: Assess, LV function       INDICATION:  I42.9 Cardiomyopathy, unspecified type _________________________________________________________________________________________ ECHOCARDIOGRAPHIC MEASUREMENTS 2D DIMENSIONS AORTA         Values  Normal Range  MAIN PA     Values  Normal Range        Annulus: nm*     [2.1-2.5]     PA Main: nm*    [1.5-2.1]       Aorta Sin: 3.6 cm    [2.7-3.3]  RIGHT VENTRICLE      ST Junction: nm*     [2.3-2.9]     RV Base: 4.4 cm  [< 4.2]       Asc.Aorta: nm*     [2.3-3.1]     RV  Mid: nm*    [<3.5] LEFT VENTRICLE                   RV Length: nm*    [<8.6]         LVIDd: 5.5 cm    [3.9-5.3]  INFERIOR VENA CAVA         LVIDs: 4.7 cm            Max. IVC: nm*    [<=2.1]           FS: 14.8 %    [>25]      Min. IVC: nm*          SWT: 1.5 cm    [0.5-0.9]  ------------------          PWT: 1.0 cm    [0.5-0.9]  nm* - not measured LEFT ATRIUM        LA Diam: 3.7 cm    [2.7-3.8]      LA A4C Area: nm*     [<20]       LA Volume: nm*     [22-52] _________________________________________________________________________________________ ECHOCARDIOGRAPHIC DESCRIPTIONS AORTIC ROOT          Size: Normal       Dissection: INDETERM FOR DISSECTION AORTIC VALVE        Leaflets: Tricuspid          Morphology: Normal        Mobility: Fully mobile LEFT VENTRICLE          Size: MILDLY ENLARGED        Anterior: HYPOCONTRACTILE      Contraction: MOD GLOBAL DECREASE      Lateral: HYPOCONTRACTILE       Closest EF: 35% (Estimated)         Septal: HYPOCONTRACTILE       LV Masses: No Masses            Apical: HYPOCONTRACTILE          LVH: MILD LVH CONCENTRIC      Inferior: HYPOCONTRACTILE                            Posterior: HYPOCONTRACTILE      Dias.FxClass: (Grade 1) relaxation abnormal, E/A reversal MITRAL VALVE        Leaflets: Normal            Mobility: Fully mobile       Morphology: Normal LEFT ATRIUM          Size: Normal            LA Masses: No masses       IA Septum: Normal IAS MAIN PA          Size: Normal PULMONIC VALVE       Morphology: Normal            Mobility: Fully mobile RIGHT VENTRICLE          Size: Normal            Free Wall: Normal      Contraction: Normal            RV Masses: No Masses TRICUSPID VALVE        Leaflets: Normal            Mobility: Fully mobile       Morphology: Normal RIGHT ATRIUM          Size: Normal  RA Other: None        RA Mass: No masses PERICARDIUM         Fluid: No effusion INFERIOR VENACAVA          Size: Normal Normal respiratory collapse _________________________________________________________________________________________  DOPPLER ECHO and OTHER SPECIAL PROCEDURES         Aortic: TRIVIAL AR         No AS             149.0 cm/sec peak vel   8.9 mmHg peak grad             5.0 mmHg mean grad     3.0 cm^2 by DOPPLER         Mitral: MILD MR          No MS             MV Inflow E Vel = 59.3 cm/sec   MV Annulus E'Vel = 5.3 cm/sec             E/E'Ratio = 11.2       Tricuspid: MILD TR          No TS             248.0 cm/sec peak TR vel  29.6 mmHg peak RV pressure       Pulmonary: TRIVIAL PR         No PS _________________________________________________________________________________________ INTERPRETATION MODERATE LV SYSTOLIC DYSFUNCTION WITH AN ESTIMATED EF = 30-35 % NORMAL RIGHT VENTRICULAR SYSTOLIC  FUNCTION MILD TRICUSPID AND MITRAL VALVE INSUFFICIENCY TRACE AORTIC VALVE INSUFFICIENCY NO VALVULAR STENOSIS MILD LV ENLARGEMENT MILD LVH _________________________________________________________________________________________ Electronically signed by  Lujean Amel, MD on 03/15/2018 05: 13 PM      Performed By: Cephus Shelling, RVT   Ordering Physician: Lujean Amel, MD _________________________________________________________________________________________  Other Result Information  Interface, Text Results In - 03/15/2018  5:13 PM EST                     CARDIOLOGY DEPARTMENT                    [facility]                                    [id number]           [place] #: [id number]           [address], [place], [place] [address]       Date: 03/10/2018 12: 79 PM                                                              Adult   Female   Age: 44 yrs           ECHOCARDIOGRAM REPORT  Outpatient                                                              [facility]      STUDY:CHEST WALL               TAPE:                    MD1: CALLWOOD, DWAYNE DENNIS       ECHO:Yes    DOPPLER:Yes       FILE:                    BP: 124/76 mmHg      COLOR:Yes   CONTRAST:No     MACHINE:Philips  RV BIOPSY:No          3D:No  SOUND QLTY:Moderate            Height: 67 in     MEDIUM:None                                              Weight: 297 lb                                                              BSA: 2.4 m2 _________________________________________________________________________________________               HISTORY: Cardiomyopathy                REASON: Assess, LV function            INDICATION: I42.9 Cardiomyopathy, unspecified type _________________________________________________________________________________________ ECHOCARDIOGRAPHIC MEASUREMENTS 2D  DIMENSIONS AORTA                  Values   Normal Range   MAIN PA         Values    Normal Range               Annulus: nm*          [2.1-2.5]         PA Main: nm*       [1.5-2.1]             Aorta Sin: 3.6 cm       [2.7-3.3]    RIGHT VENTRICLE           ST Junction: nm*          [2.3-2.9]         RV Base: 4.4 cm    [< 4.2]             Asc.Aorta: nm*          [2.3-3.1]          RV Mid: nm*       [<3.5] LEFT VENTRICLE  RV Length: nm*       [<8.6]                 LVIDd: 5.5 cm       [3.9-5.3]    INFERIOR VENA CAVA                 LVIDs: 4.7 cm                        Max. IVC: nm*       [<=2.1]                    FS: 14.8 %       [>25]            Min. IVC: nm*                   SWT: 1.5 cm       [0.5-0.9]    ------------------                   PWT: 1.0 cm       [0.5-0.9]    nm* - not measured LEFT ATRIUM               LA Diam: 3.7 cm       [2.7-3.8]           LA A4C Area: nm*          [<20]             LA Volume: nm*          [22-52] _________________________________________________________________________________________ ECHOCARDIOGRAPHIC DESCRIPTIONS AORTIC ROOT                  Size: Normal            Dissection: INDETERM FOR DISSECTION AORTIC VALVE              Leaflets: Tricuspid                   Morphology: Normal              Mobility: Fully mobile LEFT VENTRICLE                  Size: MILDLY ENLARGED               Anterior: HYPOCONTRACTILE           Contraction: MOD GLOBAL DECREASE            Lateral: HYPOCONTRACTILE            Closest EF: 35% (Estimated)                 Septal: HYPOCONTRACTILE             LV Masses: No Masses                       Apical: HYPOCONTRACTILE                   LVH: MILD LVH CONCENTRIC           Inferior: HYPOCONTRACTILE                                                     Posterior: HYPOCONTRACTILE  Dias.FxClass: (Grade 1) relaxation abnormal, E/A reversal MITRAL VALVE              Leaflets: Normal                         Mobility: Fully mobile            Morphology: Normal LEFT ATRIUM                  Size: Normal                       LA Masses: No masses             IA Septum: Normal IAS MAIN PA                  Size: Normal PULMONIC VALVE            Morphology: Normal                        Mobility: Fully mobile RIGHT VENTRICLE                  Size: Normal                       Free Wall: Normal           Contraction: Normal                       RV Masses: No Masses TRICUSPID VALVE              Leaflets: Normal                        Mobility: Fully mobile            Morphology: Normal RIGHT ATRIUM                  Size: Normal                        RA Other: None               RA Mass: No masses PERICARDIUM                 Fluid: No effusion INFERIOR VENACAVA                  Size: Normal Normal respiratory collapse _________________________________________________________________________________________  DOPPLER ECHO and OTHER SPECIAL PROCEDURES                Aortic: TRIVIAL AR                 No AS                        149.0 cm/sec peak vel      8.9 mmHg peak grad                        5.0 mmHg mean grad         3.0 cm^2 by DOPPLER                Mitral: MILD MR                    No MS  MV Inflow E Vel = 59.3 cm/sec     MV Annulus E'Vel = 5.3 cm/sec                        E/E'Ratio = 11.2             Tricuspid: MILD TR                    No TS                        248.0 cm/sec peak TR vel   29.6 mmHg peak RV pressure             Pulmonary: TRIVIAL PR                 No PS _________________________________________________________________________________________ INTERPRETATION MODERATE LV SYSTOLIC DYSFUNCTION WITH AN ESTIMATED EF = 30-35 % NORMAL RIGHT VENTRICULAR SYSTOLIC FUNCTION MILD TRICUSPID AND MITRAL VALVE INSUFFICIENCY TRACE AORTIC VALVE INSUFFICIENCY NO VALVULAR STENOSIS MILD LV ENLARGEMENT MILD  LVH _________________________________________________________________________________________ Electronically signed by    Lujean Amel, MD on 03/15/2018 05: 13 PM          Performed By: Johnathan Hausen, RDCS, RVT    Ordering Physician: Lujean Amel, MD _________________________________________________________________________________________  Status Results Details   Encounter Summary

## 2019-02-27 NOTE — OR Nursing (Signed)
Dr Rosey Bath reviewed chart and hx, no cardiac clearance ordered at this time.  EKG and labs ordered.

## 2019-02-27 NOTE — Pre-Procedure Instructions (Signed)
Pt notified by phone- given arrival time for 02-28-2019 9:15am at [facility]. Visitation reviewed. Medications to take morning of surgery-carvedilol,gabapentin, pantoprazole and pain pill if needed. Pt to use inhalers day of surgery. NPO after midnight and can have water up to 7:15 am. Shower morning of surgery. No jewelry, hair pins or clips. No lotions powders perfumes. Have driver to drive you home and stay with you for 24 hours.

## 2019-02-28 ENCOUNTER — Encounter: Payer: Self-pay | Admitting: Otolaryngology

## 2019-02-28 ENCOUNTER — Ambulatory Visit
Admission: RE | Admit: 2019-02-28 | Discharge: 2019-02-28 | Disposition: A | Discharge status: Home / Self Care | Payer: Medicare Other | Attending: Otolaryngology | Admitting: Otolaryngology

## 2019-02-28 ENCOUNTER — Encounter: Payer: Self-pay | Admitting: Anesthesiology

## 2019-02-28 ENCOUNTER — Ambulatory Visit: Payer: Self-pay | Admitting: Anesthesiology

## 2019-02-28 ENCOUNTER — Other Ambulatory Visit: Payer: Self-pay

## 2019-02-28 ENCOUNTER — Encounter
Admission: RE | Disposition: A | Discharge status: Home / Self Care | Payer: Self-pay | Source: Home / Self Care | Attending: Otolaryngology

## 2019-02-28 DIAGNOSIS — Z86718 Personal history of other venous thrombosis and embolism: Secondary | ICD-10-CM | POA: Diagnosis not present

## 2019-02-28 DIAGNOSIS — J449 Chronic obstructive pulmonary disease, unspecified: Secondary | ICD-10-CM | POA: Diagnosis not present

## 2019-02-28 DIAGNOSIS — I110 Hypertensive heart disease with heart failure: Secondary | ICD-10-CM | POA: Diagnosis not present

## 2019-02-28 DIAGNOSIS — F17200 Nicotine dependence, unspecified, uncomplicated: Secondary | ICD-10-CM | POA: Insufficient documentation

## 2019-02-28 DIAGNOSIS — Z85528 Personal history of other malignant neoplasm of kidney: Secondary | ICD-10-CM | POA: Insufficient documentation

## 2019-02-28 DIAGNOSIS — Z6841 Body Mass Index (BMI) 40.0 and over, adult: Secondary | ICD-10-CM | POA: Diagnosis not present

## 2019-02-28 DIAGNOSIS — I509 Heart failure, unspecified: Secondary | ICD-10-CM | POA: Diagnosis not present

## 2019-02-28 DIAGNOSIS — Z96653 Presence of artificial knee joint, bilateral: Secondary | ICD-10-CM | POA: Diagnosis not present

## 2019-02-28 DIAGNOSIS — Z86711 Personal history of pulmonary embolism: Secondary | ICD-10-CM | POA: Insufficient documentation

## 2019-02-28 DIAGNOSIS — K117 Disturbances of salivary secretion: Secondary | ICD-10-CM | POA: Diagnosis present

## 2019-02-28 DIAGNOSIS — Z980 Intestinal bypass and anastomosis status: Secondary | ICD-10-CM | POA: Insufficient documentation

## 2019-02-28 DIAGNOSIS — K1123 Chronic sialoadenitis: Secondary | ICD-10-CM | POA: Diagnosis not present

## 2019-02-28 HISTORY — PX: FLOOR OF MOUTH BIOPSY: SHX5834

## 2019-02-28 LAB — BASIC METABOLIC PANEL
Anion gap: 4 — ABNORMAL LOW (ref 5–15)
BUN: 10 mg/dL (ref 8–23)
CO2: 33 mmol/L — ABNORMAL HIGH (ref 22–32)
Calcium: 9.1 mg/dL (ref 8.9–10.3)
Chloride: 104 mmol/L (ref 98–111)
Creatinine, Ser: 0.74 mg/dL (ref 0.44–1.00)
GFR calc Af Amer: >60 mL/min (ref >60)
GFR calc non Af Amer: >60 mL/min (ref >60)
Glucose, Bld: 88 mg/dL (ref 70–99)
Potassium: 3.6 mmol/L (ref 3.5–5.1)
Sodium: 141 mmol/L (ref 135–145)

## 2019-02-28 SURGERY — BIOPSY, MOUTH, FLOOR
Anesthesia: Monitor Anesthesia Care

## 2019-02-28 MED ORDER — FENTANYL CITRATE (PF) 100 MCG/2ML IJ SOLN: 25.0000 ug | INTRAMUSCULAR | Status: DC | PRN

## 2019-02-28 MED ORDER — LACTATED RINGERS IV SOLN: INTRAVENOUS | Status: DC

## 2019-02-28 MED ORDER — DEXMEDETOMIDINE HCL 200 MCG/2ML IV SOLN
INTRAVENOUS | Status: DC | PRN
  Administered 2019-02-28: 20 ug via INTRAVENOUS
  Administered 2019-02-28: 8 ug via INTRAVENOUS
  Administered 2019-02-28: 12 ug via INTRAVENOUS

## 2019-02-28 MED ORDER — PROPOFOL 500 MG/50ML IV EMUL
INTRAVENOUS | Status: AC
  Filled 2019-02-28: qty 50

## 2019-02-28 MED ORDER — PHENYLEPHRINE HCL (PRESSORS) 10 MG/ML IV SOLN
INTRAVENOUS | Status: DC | PRN
  Administered 2019-02-28 (×2): 50 ug via INTRAVENOUS

## 2019-02-28 MED ORDER — LIDOCAINE-EPINEPHRINE 1 %-1:100000 IJ SOLN
INTRAMUSCULAR | Status: DC | PRN
  Administered 2019-02-28: 2 mL

## 2019-02-28 MED ORDER — PROPOFOL 10 MG/ML IV BOLUS
INTRAVENOUS | Status: DC | PRN
  Administered 2019-02-28: 20 mg via INTRAVENOUS
  Administered 2019-02-28: 50 mg via INTRAVENOUS

## 2019-02-28 MED ORDER — MIDAZOLAM HCL 2 MG/2ML IJ SOLN
INTRAMUSCULAR | Status: AC
  Filled 2019-02-28: qty 2

## 2019-02-28 MED ORDER — ONDANSETRON HCL 4 MG PO TABS: 4.0000 mg | ORAL_TABLET | Freq: Three times a day (TID) | ORAL | 0 refills | Status: DC | PRN

## 2019-02-28 MED ORDER — LIDOCAINE-EPINEPHRINE 1 %-1:100000 IJ SOLN
INTRAMUSCULAR | Status: AC
  Filled 2019-02-28: qty 1

## 2019-02-28 MED ORDER — FENTANYL CITRATE (PF) 100 MCG/2ML IJ SOLN
INTRAMUSCULAR | Status: AC
  Filled 2019-02-28: qty 2

## 2019-02-28 MED ORDER — FENTANYL CITRATE (PF) 100 MCG/2ML IJ SOLN
INTRAMUSCULAR | Status: DC | PRN
  Administered 2019-02-28 (×2): 50 ug via INTRAVENOUS

## 2019-02-28 MED ORDER — MIDAZOLAM HCL 2 MG/2ML IJ SOLN
INTRAMUSCULAR | Status: DC | PRN
  Administered 2019-02-28 (×2): 1 mg via INTRAVENOUS

## 2019-02-28 SURGICAL SUPPLY — 27 items
BLADE SURG 15 STRL LF DISP TIS (BLADE) ×1 IMPLANT
BLADE SURG 15 STRL SS (BLADE) ×2
CANISTER SUCT 1200ML W/VALVE (MISCELLANEOUS) ×3 IMPLANT
CORD BIP STRL DISP 12FT (MISCELLANEOUS) IMPLANT
COVER WAND RF STERILE (DRAPES) ×3 IMPLANT
DRAPE MAG INST 16X20 L/F (DRAPES) IMPLANT
ELECT NEEDLE 20X.3 GREEN (MISCELLANEOUS)
ELECT REM PT RETURN 9FT ADLT (ELECTROSURGICAL)
ELECTRODE NEEDLE 20X.3 GREEN (MISCELLANEOUS) IMPLANT
ELECTRODE REM PT RTRN 9FT ADLT (ELECTROSURGICAL) IMPLANT
FORCEPS JEWEL BIP 4-3/4 STR (INSTRUMENTS) IMPLANT
GLOVE BIO SURGEON STRL SZ7.5 (GLOVE) ×3 IMPLANT
GOWN STRL REUS W/ TWL LRG LVL3 (GOWN DISPOSABLE) ×2 IMPLANT
GOWN STRL REUS W/TWL LRG LVL3 (GOWN DISPOSABLE) ×4
HOOK STAY BLUNT/RETRACTOR 5M (MISCELLANEOUS) IMPLANT
MARKER SKIN DUAL TIP RULER LAB (MISCELLANEOUS) IMPLANT
NS IRRIG 500ML POUR BTL (IV SOLUTION) ×3 IMPLANT
PACK HEAD/NECK (MISCELLANEOUS) IMPLANT
SPONGE KITTNER 5P (MISCELLANEOUS) IMPLANT
SUT ETHILON 4-0 (SUTURE)
SUT ETHILON 4-0 FS2 18XMFL BLK (SUTURE)
SUT ETHILON 5-0 FS-2 18 BLK (SUTURE) IMPLANT
SUT SILK 2 0 (SUTURE)
SUT SILK 2-0 18XBRD TIE 12 (SUTURE) IMPLANT
SUT VIC AB 4-0 BRD 54 (SUTURE) IMPLANT
SUTURE ETHLN 4-0 FS2 18XMF BLK (SUTURE) IMPLANT
SYR 3ML LL SCALE MARK (SYRINGE) ×3 IMPLANT

## 2019-02-28 NOTE — Op Note (Signed)
..  02/28/2019  11:42 AM    [name]  VL:[id number]   Pre-Op Dx:  xerostomia  Post-op Dx: xerostomia  Proc:Minor Salivary Gland biopsy  Surg: Jeannie Fend Porschia [place]  Anes:  IV sedation  EBL:  None  Comp:  None  Findings:  7-8 minor salivary glands biopsied  Procedure: With the patient in a comfortable supine position,IV anesthesia was administered.  At an appropriate level, a chalazion clamp was placed onto the patient's lower lip after injection of 1.73ml of 1% lidocaine with 1:100,000 epinephrine.  A 15 blade scalpel was used to incise the mucosa.  Using tenotomy scissors and hemostat, 7 to 8 minor salivary glands were dissected.  Care was taken to avoid trauma to neurovascular structures.  The wound was closed with 4.0 chromic suture.  Following this  The patient was returned to anesthesia, awakened, and transferred to recovery in stable condition.  Dispo:  PACU to home  Plan:Will call with pathology.  Recheck my office three weeks.   Jeannie Fend Orin Eberwein 11:42 AM 02/28/2019

## 2019-02-28 NOTE — H&P (Signed)
..  History and Physical paper copy reviewed and updated date of procedure and will be scanned into system.  Patient seen and examined.  

## 2019-02-28 NOTE — Discharge Instructions (Signed)

## 2019-02-28 NOTE — OR Nursing (Signed)
Anesthesia in to see patient reviewed am EKG and labs.  No new orders.

## 2019-02-28 NOTE — Anesthesia Preprocedure Evaluation (Addendum)
Anesthesia Evaluation  Patient identified by MRN, date of birth, ID band Patient awake    Reviewed: Allergy & Precautions, H&P , NPO status , Patient's Chart, lab work & pertinent test results  History of Anesthesia Complications (+) AWARENESS UNDER ANESTHESIA and history of anesthetic complications  Airway Mallampati: III  TM Distance: >3 FB     Dental  (+) Chipped   Pulmonary asthma , COPD, neg recent URI, Current Smoker and Patient abstained from smoking.,  H/o PE          Cardiovascular hypertension, (-) angina+CHF    H/o frequent PVC's s/p ablation  Echo 09/08/17: - Left ventricle: The cavity size was mildly dilated. Systolic  function was severely reduced. The estimated ejection fraction  was in the range of 20% to 25%. Diffuse hypokinesis. Regional  wall motion abnormalities cannot be excluded. Doppler parameters  are consistent with abnormal left ventricular relaxation (grade 1  diastolic dysfunction).  - Mitral valve: There was mild regurgitation.  - Left atrium: The atrium was mildly dilated.  - Right ventricle: Systolic function was normal.  - Pulmonary arteries: Systolic pressure was within the normal  range.    Neuro/Psych  Headaches, negative psych ROS   GI/Hepatic Neg liver ROS, GERD  ,  Endo/Other  Morbid obesity  Renal/GU Renal cell carcinoma     Musculoskeletal   Abdominal   Peds  Hematology negative hematology ROS (+)   Anesthesia Other Findings Past Medical History: No date: Allergic rhinitis No date: Arthritis No date: Asthma     Comment:  problems with fumes and aerosols cause asthma 10/19/2016: Chest pain No date: Complication of anesthesia     Comment:  awakens during surgery; has occurred with last 3-4               surgeries  No date: COPD (chronic obstructive pulmonary disease) ([place]) 07/13/2018: DVT (deep venous thrombosis) ([facility])     Comment:  Right leg No date:  Environmental allergies     Comment:  fumes  No date: Fibromyalgia No date: GERD (gastroesophageal reflux disease) No date: Headache No date: Hemorrhoids No date: History of bronchitis 12/13/2015: Hx of total knee arthroplasty No date: Hypertension 07/25/2015: Morbid obesity with BMI of 45.0-49.9, adult ([place]) No date: NICM (nonischemic cardiomyopathy) ([place])     Comment:  a. ? PVC mediated;  b. 06/2016 Echo: EF 25-30%, mild LVH,              mild LAE, mild AI/MR/TR/PR; c. 08/2016 Cath: nl cors, EF               25%. No date: Numbness     Comment:  hands bilat when driving; improves when not preforming               task  08/02/2015: Osteoarthritis of left knee 07/13/2018: PE (pulmonary thromboembolism) ([place]) 05/18/2016: Pes anserinus bursitis of left knee 05/18/2016: Presence of right artificial knee joint 06/04/2017: PVC (premature ventricular contraction) No date: PVC's (premature ventricular contractions)     Comment:  a. 11/2016 [place] started;  b. 11/29/16 48h Holter: 57574               PVC's (41%); c. 12/2016 24h Holter: 18040 PVC's (15%); c.              02/2017 48h Holter: 37262 PVC's (41%). 06/19/2015: Spinal stenosis of lumbar region No date: Status post gastric banding 08/02/2015: Status post total left knee replacement No date: Tinnitus  Comment:  comes and goes  12/13/2015: Unilateral primary osteoarthritis, right knee No date: Vertigo     Comment:  none for over 2 yrs  Past Surgical History: 1979: ABDOMINAL HYSTERECTOMY     Comment:  left ovary remains No date: ABDOMINAL HYSTERECTOMY No date: CHOLECYSTECTOMY No date: COLONOSCOPY No date: fibrous tissue removed from right shoulder and back of neck      Comment:  2 years ago  No date: GANGLION CYST EXCISION 1980: GASTRIC BYPASS     Comment:  states had bypass with banding and band left in - No date: GASTRIC BYPASS OPEN 2017: JOINT REPLACEMENT; Bilateral     Comment:  knees No date: KNEE SURGERY; Bilateral     Comment:   both knees  2001 1205/19: NECK SURGERY; N/A     Comment:  ganglion cyst removal, benigh  07/14/2018: PULMONARY THROMBECTOMY; Bilateral     Comment:  Procedure: PULMONARY THROMBECTOMY;  Surgeon: Algernon Huxley, MD;  Location: [facility];  Service:               Cardiovascular;  Laterality: Bilateral; 06/04/2017: PVC ABLATION; N/A     Comment:  Procedure: PVC ABLATION;  Surgeon: Evans Lance, MD;               Location: [facility];  Service: Cardiovascular;                Laterality: N/A; 08/12/2016: RIGHT/LEFT HEART CATH AND CORONARY ANGIOGRAPHY; Bilateral     Comment:  Procedure: Right/Left Heart Cath and Coronary               Angiography;  Surgeon: Yolonda Kida, MD;  Location:              [facility];  Service: Cardiovascular;                Laterality: Bilateral; No date: TONSILLECTOMY     Comment:  age 69 08/02/2015: TOTAL KNEE ARTHROPLASTY; Left     Comment:  Procedure: LEFT TOTAL KNEE ARTHROPLASTY;  Surgeon:               Mcarthur Rossetti, MD;  Location: [facility];  Service:               Orthopedics;  Laterality: Left; 12/13/2015: TOTAL KNEE ARTHROPLASTY; Right     Comment:  Procedure: RIGHT TOTAL KNEE ARTHROPLASTY;  Surgeon:               Mcarthur Rossetti, MD;  Location: [facility];  Service:               Orthopedics;  Laterality: Right;  BMI    Body Mass Index: 45.73 kg/m      Reproductive/Obstetrics negative OB ROS                          Anesthesia Physical Anesthesia Plan  ASA: III  Anesthesia Plan: MAC   Post-op Pain Management:    Induction:   PONV Risk Score and Plan: Treatment may vary due to age or medical condition  Airway Management Planned:   Additional Equipment:   Intra-op Plan:   Post-operative Plan:   Informed Consent: I have reviewed the patients History and Physical, chart, labs and discussed the procedure including the risks, benefits and alternatives for the  proposed anesthesia with  the patient or authorized representative who has indicated his/her understanding and acceptance.     Dental Advisory Given  Plan Discussed with: Anesthesiologist  Anesthesia Plan Comments:     Anesthesia Quick Evaluation

## 2019-02-28 NOTE — Transfer of Care (Signed)
Immediate Anesthesia Transfer of Care Note  Patient: Wendy Sanchez  Procedure(s) Performed: SALIVARY GLAND BIOPSY (N/A )  Patient Location: PACU  Anesthesia Type:General  Level of Consciousness: drowsy and patient cooperative  Airway & Oxygen Therapy: Patient Spontanous Breathing  Post-op Assessment: Report given to RN and Post -op Vital signs reviewed and stable  Post vital signs: Reviewed and stable  Last Vitals:  Vitals Value Taken Time  BP 90/59 02/28/19 1154  Temp 36.6 C 02/28/19 1148  Pulse 68 02/28/19 1154  Resp 12 02/28/19 1154  SpO2 96 % 02/28/19 1154  Vitals shown include unvalidated device data.  Last Pain:  Vitals:   02/28/19 1148  TempSrc:   PainSc: 0-No pain         Complications: No apparent anesthesia complications

## 2019-02-28 NOTE — OR Nursing (Signed)
Discussed eliquis and aspirin with Dr. Pryor Ochoa via tele, advises ok for pt to reesume tomorrow 03/01/19.  Pt notified of same with discharge instructions.

## 2019-03-01 LAB — SURGICAL PATHOLOGY

## 2019-03-01 NOTE — Anesthesia Postprocedure Evaluation (Signed)
Anesthesia Post Note  Patient: Wendy Sanchez  Procedure(s) Performed: SALIVARY GLAND BIOPSY (N/A )  Patient location during evaluation: PACU Anesthesia Type: General Level of consciousness: awake and alert Pain management: pain level controlled Vital Signs Assessment: post-procedure vital signs reviewed and stable Respiratory status: spontaneous breathing, nonlabored ventilation, respiratory function stable and patient connected to nasal cannula oxygen Cardiovascular status: blood pressure returned to baseline and stable Postop Assessment: no apparent nausea or vomiting Anesthetic complications: no     Last Vitals:  Vitals:   02/28/19 1325 02/28/19 1347  BP: 122/71 127/80  Pulse: 60 63  Resp: 14 16  Temp: (!) 36.2 C   SpO2: 99% 100%    Last Pain:  Vitals:   03/01/19 0822  TempSrc:   PainSc: [place]

## 2019-03-13 ENCOUNTER — Other Ambulatory Visit: Payer: Self-pay

## 2019-03-13 ENCOUNTER — Encounter: Payer: Self-pay | Admitting: Anesthesiology

## 2019-03-13 ENCOUNTER — Ambulatory Visit: Payer: Medicare HMO | Attending: Anesthesiology | Admitting: Anesthesiology

## 2019-03-13 DIAGNOSIS — M5441 Lumbago with sciatica, right side: Secondary | ICD-10-CM | POA: Diagnosis not present

## 2019-03-13 DIAGNOSIS — M1711 Unilateral primary osteoarthritis, right knee: Secondary | ICD-10-CM | POA: Diagnosis not present

## 2019-03-13 DIAGNOSIS — F1190 Opioid use, unspecified, uncomplicated: Secondary | ICD-10-CM

## 2019-03-13 DIAGNOSIS — M5442 Lumbago with sciatica, left side: Secondary | ICD-10-CM

## 2019-03-13 DIAGNOSIS — M5136 Other intervertebral disc degeneration, lumbar region: Secondary | ICD-10-CM

## 2019-03-13 DIAGNOSIS — G894 Chronic pain syndrome: Secondary | ICD-10-CM | POA: Diagnosis not present

## 2019-03-13 DIAGNOSIS — G8929 Other chronic pain: Secondary | ICD-10-CM

## 2019-03-13 DIAGNOSIS — M48062 Spinal stenosis, lumbar region with neurogenic claudication: Secondary | ICD-10-CM

## 2019-03-13 DIAGNOSIS — M1712 Unilateral primary osteoarthritis, left knee: Secondary | ICD-10-CM | POA: Diagnosis not present

## 2019-03-13 DIAGNOSIS — M25551 Pain in right hip: Secondary | ICD-10-CM

## 2019-03-13 DIAGNOSIS — Z79891 Long term (current) use of opiate analgesic: Secondary | ICD-10-CM

## 2019-03-13 DIAGNOSIS — M25552 Pain in left hip: Secondary | ICD-10-CM

## 2019-03-13 MED ORDER — OXYCODONE-ACETAMINOPHEN 10-325 MG PO TABS: 1.0000 | ORAL_TABLET | Freq: Four times a day (QID) | ORAL | 0 refills | Status: AC | PRN

## 2019-03-13 MED ORDER — OXYCODONE-ACETAMINOPHEN 10-325 MG PO TABS: 1.0000 | ORAL_TABLET | Freq: Four times a day (QID) | ORAL | 0 refills | Status: DC | PRN

## 2019-03-13 NOTE — Progress Notes (Signed)
Virtual Visit via Telephone Note  I connected with Wendy Sanchez on 03/13/19 at  2:15 PM EST by telephone and verified that I am speaking with the correct person using two identifiers.  Location: Patient: Home Provider: Pain control center   I discussed the limitations, risks, security and privacy concerns of performing an evaluation and management service by telephone and the availability of in person appointments. I also discussed with the patient that there may be a patient responsible charge related to this service. The patient expressed understanding and agreed to proceed.   History of Present Illness: I spoke with Wendy Sanchez today via telephone.  This was her virtual conference and she was unable to do the video portion.  After identifying her she stated that her back pain has been doing reasonably well with the increase to 4 times daily dosing.  The quality characteristic and distribution of her back pain is been stable she still getting some pain radiating into the hips buttocks and posterior legs.  Her strength has been good with no changes noted.  She is tolerating the medications well and getting good relief with the opioids.  No side effects reported.  She is taking gabapentin as well and rarely uses her muscle relaxant.  She is trying to do physical therapy exercises as tolerated but with the Covid crisis it has been difficult.  Otherwise she is in her usual state of health.    Observations/Objective:  Current Outpatient Medications:  .  albuterol (PROVENTIL HFA;VENTOLIN HFA) 108 (90 Base) MCG/ACT inhaler, Inhale 2 puffs into the lungs every 6 (six) hours as needed for wheezing or shortness of breath., Disp: , Rfl:  .  apixaban (ELIQUIS) 5 MG TABS tablet, Take 1 tablet (5 mg total) by mouth 2 (two) times daily., Disp: 60 tablet, Rfl: 0 .  Ascorbic Acid (VITAMIN C PO), Take 2 tablets by mouth daily., Disp: , Rfl:  .  aspirin EC 81 MG tablet, Take 1 tablet (81 mg total) daily by  mouth. (Patient not taking: Reported on 02/22/2019), Disp: 90 tablet, Rfl: 3 .  azelastine (ASTELIN) 0.1 % nasal spray, Place 1 spray into both nostrils 2 (two) times daily as needed for allergies. Use in each nostril as directed , Disp: , Rfl:  .  carvedilol (COREG) 6.25 MG tablet, Take 1 tablet (6.25 mg total) by mouth 2 (two) times daily., Disp: 180 tablet, Rfl: 3 .  cyclobenzaprine (FLEXERIL) 10 MG tablet, Take 1 tablet (10 mg total) by mouth 3 (three) times daily as needed. for muscle spams (Patient not taking: Reported on 02/28/2019), Disp: 270 tablet, Rfl: 1 .  Dextromethorphan-guaiFENesin (CORICIDIN HBP CONGESTION/COUGH) 10-200 MG CAPS, Take 2 tablets by mouth 2 (two) times daily as needed (cough / congestion)., Disp: , Rfl:  .  diclofenac sodium (VOLTAREN) 1 % GEL, Apply 2 g topically 4 (four) times daily as needed (pain). , Disp: , Rfl:  .  diphenhydrAMINE (BENADRYL) 25 mg capsule, Take 50 mg by mouth every 6 (six) hours as needed for allergies. , Disp: , Rfl:  .  docusate sodium (COLACE) 100 MG capsule, Take 100 mg by mouth at bedtime as needed for mild constipation. , Disp: , Rfl:  .  ELDERBERRY PO, Take 100 mg by mouth daily. , Disp: , Rfl:  .  fluconazole (DIFLUCAN) 100 MG tablet, Take 100 mg by mouth daily as needed (yeast infections)., Disp: , Rfl:  .  folic acid (FOLVITE) 1 MG tablet, Take 1 mg by mouth at  bedtime. , Disp: , Rfl:  .  furosemide (LASIX) 40 MG tablet, Take 2 tablets (80 mg) by mouth once daily as directed (Patient taking differently: Take 80 mg by mouth daily. ), Disp: 180 tablet, Rfl: 3 .  gabapentin (NEURONTIN) 300 MG capsule, Take 2 capsules (600 mg total) by mouth 3 (three) times daily. (Patient taking differently: Take 600 mg by mouth See admin instructions. Take 600 mg by mouth twice daily, may take a third 600 mg dose as needed for pain), Disp: 540 capsule, Rfl: 0 .  gentamicin ointment (GARAMYCIN) 0.1 %, Apply 1 application topically 5 (five) times daily as needed  (boil). , Disp: , Rfl:  .  Guaifenesin (MUCINEX MAXIMUM STRENGTH) 1200 MG TB12, Take 1,200 mg by mouth 2 (two) times daily as needed (congestion)., Disp: , Rfl:  .  ipratropium-albuterol (DUONEB) 0.5-2.5 (3) MG/3ML SOLN, Take 3 mLs by nebulization every 6 (six) hours as needed (shortness of breath)., Disp: , Rfl:  .  levocetirizine (XYZAL) 5 MG tablet, Take 5 mg by mouth at bedtime. , Disp: , Rfl:  .  mirabegron ER (MYRBETRIQ) 50 MG TB24 tablet, Take 50 mg by mouth daily., Disp: , Rfl:  .  montelukast (SINGULAIR) 10 MG tablet, Take 10 mg by mouth at bedtime., Disp: , Rfl:  .  Multiple Vitamins-Minerals (MULTIVITAMIN GUMMIES ADULTS) CHEW, Chew 2 tablets by mouth daily. , Disp: , Rfl:  .  nortriptyline (PAMELOR) 50 MG capsule, Take 50 mg by mouth at bedtime., Disp: , Rfl:  .  nystatin (MYCOSTATIN) 100000 UNIT/ML suspension, Take 5 mLs by mouth 4 (four) times daily as needed (yeast). , Disp: , Rfl:  .  nystatin cream (MYCOSTATIN), Apply 1 application topically 2 (two) times daily as needed (yeast)., Disp: , Rfl:  .  ondansetron (ZOFRAN) 4 MG tablet, Take 1 tablet (4 mg total) by mouth every 8 (eight) hours as needed for up to 10 doses for nausea or vomiting., Disp: 20 tablet, Rfl: 0 .  [START ON 03/15/2019] oxyCODONE-acetaminophen (PERCOCET) 10-325 MG tablet, Take 1 tablet by mouth every 6 (six) hours as needed for pain., Disp: 120 tablet, Rfl: 0 .  [START ON 04/14/2019] oxyCODONE-acetaminophen (PERCOCET) 10-325 MG tablet, Take 1 tablet by mouth every 6 (six) hours as needed for pain., Disp: 120 tablet, Rfl: 0 .  pantoprazole (PROTONIX) 40 MG tablet, Take 40 mg by mouth 2 (two) times daily., Disp: , Rfl:  .  pilocarpine (SALAGEN) 5 MG tablet, Take 5 mg by mouth 2 (two) times daily. , Disp: , Rfl:  .  Polyvinyl Alcohol-Povidone (REFRESH OP), Place 1 drop into both eyes daily as needed (dry eyes)., Disp: , Rfl:  .  potassium chloride SA (K-DUR,KLOR-CON) 20 MEQ tablet, Take 20 mEq by mouth 3 days. , Disp: ,  Rfl:  .  Probiotic Product (PROBIOTIC COLON SUPPORT) CAPS, Take 1 capsule by mouth at bedtime., Disp: , Rfl:  .  sacubitril-valsartan (ENTRESTO) 49-51 MG, Take 1 tablet by mouth 2 (two) times daily., Disp: , Rfl:  .  sucralfate (CARAFATE) 1 G tablet, Take 1 g by mouth 2 (two) times daily before a meal. , Disp: , Rfl:  .  umeclidinium-vilanterol (ANORO ELLIPTA) 62.5-25 MCG/INH AEPB, Inhale 1 puff into the lungs daily., Disp: , Rfl:  .  Vitamin D, Ergocalciferol, (DRISDOL) 50000 UNITS CAPS capsule, Take 50,000 Units by mouth every Thursday. , Disp: , Rfl:  .  Xylitol (XYLIMELTS) 550 MG DISK, Use as directed 550 mg in the mouth or throat daily.,  Disp: , Rfl:   Assessment and Plan: 1. Unilateral primary osteoarthritis, right knee   2. DDD (degenerative disc disease), lumbar   3. Chronic bilateral low back pain with bilateral sciatica   4. Hip pain, bilateral   5. Chronic, continuous use of opioids   6. Chronic pain syndrome   7. Osteoarthritis of left knee, unspecified osteoarthritis type   8. Spinal stenosis of lumbar region with neurogenic claudication   Based on our discussion today and upon review of the [facility] practitioner database information when to refill her medications to be dated for March 3 and April 2.  She states that the medications are working well for her and she is not having any side effects.  I encouraged her to continue with exercise stretching strengthening and we will schedule her for return to clinic in 2 months.  I want her to continue follow-up with her primary care physicians for baseline medical care.  She has been instructed to contact us at the pain control center should she have any pain related questions or problems before her next appointment.   Follow Up Instructions:    I discussed the assessment and treatment plan with the patient. The patient was provided an opportunity to ask questions and all were answered. The patient agreed with the plan and  demonstrated an understanding of the instructions.   The patient was advised to call back or seek an in-person evaluation if the symptoms worsen or if the condition fails to improve as anticipated.  I provided 30 minutes of non-face-to-face time during this encounter.   Molli Barrows, MD

## 2019-03-17 ENCOUNTER — Telehealth: Payer: Self-pay

## 2019-03-17 NOTE — Telephone Encounter (Signed)
Patient called and states that you were going to give her quantity of 150 oxycodone and you only gave her #120.  She would like for you to please correct this.  Thank you

## 2019-03-21 DIAGNOSIS — R682 Dry mouth, unspecified: Secondary | ICD-10-CM | POA: Diagnosis not present

## 2019-03-21 DIAGNOSIS — J340 Abscess, furuncle and carbuncle of nose: Secondary | ICD-10-CM | POA: Diagnosis not present

## 2019-03-24 DIAGNOSIS — I5020 Unspecified systolic (congestive) heart failure: Secondary | ICD-10-CM | POA: Diagnosis not present

## 2019-04-04 ENCOUNTER — Ambulatory Visit
Admission: RE | Admit: 2019-04-04 | Discharge: 2019-04-04 | Disposition: A | Discharge status: Home / Self Care | Payer: Medicare HMO | Source: Ambulatory Visit | Attending: Family Medicine | Admitting: Family Medicine

## 2019-04-04 DIAGNOSIS — Z1231 Encounter for screening mammogram for malignant neoplasm of breast: Secondary | ICD-10-CM | POA: Insufficient documentation

## 2019-04-07 ENCOUNTER — Telehealth: Payer: Self-pay | Admitting: *Deleted

## 2019-04-10 DIAGNOSIS — I10 Essential (primary) hypertension: Secondary | ICD-10-CM | POA: Diagnosis not present

## 2019-04-10 DIAGNOSIS — G4733 Obstructive sleep apnea (adult) (pediatric): Secondary | ICD-10-CM | POA: Diagnosis not present

## 2019-04-10 DIAGNOSIS — I5020 Unspecified systolic (congestive) heart failure: Secondary | ICD-10-CM | POA: Diagnosis not present

## 2019-04-10 DIAGNOSIS — J449 Chronic obstructive pulmonary disease, unspecified: Secondary | ICD-10-CM | POA: Diagnosis not present

## 2019-04-10 DIAGNOSIS — I2699 Other pulmonary embolism without acute cor pulmonale: Secondary | ICD-10-CM | POA: Diagnosis not present

## 2019-04-10 DIAGNOSIS — I493 Ventricular premature depolarization: Secondary | ICD-10-CM | POA: Diagnosis not present

## 2019-04-10 DIAGNOSIS — I428 Other cardiomyopathies: Secondary | ICD-10-CM | POA: Diagnosis not present

## 2019-04-10 DIAGNOSIS — Z720 Tobacco use: Secondary | ICD-10-CM | POA: Diagnosis not present

## 2019-04-11 NOTE — Telephone Encounter (Signed)
Anderson Malta will ask Dr. Andree Elk next time he is in clinic

## 2019-04-13 NOTE — Telephone Encounter (Signed)
Dr. Andree Elk called and states the script was meant to be for only 120 per month., trying to decrease her.  Patient called and made aware that Dr. Andree Elk decreased her amt from 150 to 120 per month intentionally.

## 2019-04-25 ENCOUNTER — Other Ambulatory Visit: Payer: Self-pay | Admitting: *Deleted

## 2019-04-25 MED ORDER — GABAPENTIN 300 MG PO CAPS: 600.0000 mg | ORAL_CAPSULE | ORAL | 0 refills | Status: DC

## 2019-05-10 ENCOUNTER — Ambulatory Visit: Payer: Medicare HMO | Attending: Anesthesiology | Admitting: Anesthesiology

## 2019-05-10 ENCOUNTER — Other Ambulatory Visit: Payer: Self-pay

## 2019-05-10 ENCOUNTER — Encounter: Payer: Self-pay | Admitting: Anesthesiology

## 2019-05-10 DIAGNOSIS — G894 Chronic pain syndrome: Secondary | ICD-10-CM | POA: Diagnosis not present

## 2019-05-10 DIAGNOSIS — M1711 Unilateral primary osteoarthritis, right knee: Secondary | ICD-10-CM | POA: Diagnosis not present

## 2019-05-10 DIAGNOSIS — M1712 Unilateral primary osteoarthritis, left knee: Secondary | ICD-10-CM

## 2019-05-10 DIAGNOSIS — M25551 Pain in right hip: Secondary | ICD-10-CM | POA: Diagnosis not present

## 2019-05-10 DIAGNOSIS — F1190 Opioid use, unspecified, uncomplicated: Secondary | ICD-10-CM

## 2019-05-10 DIAGNOSIS — M5441 Lumbago with sciatica, right side: Secondary | ICD-10-CM | POA: Diagnosis not present

## 2019-05-10 DIAGNOSIS — M48062 Spinal stenosis, lumbar region with neurogenic claudication: Secondary | ICD-10-CM | POA: Diagnosis not present

## 2019-05-10 DIAGNOSIS — M5442 Lumbago with sciatica, left side: Secondary | ICD-10-CM | POA: Diagnosis not present

## 2019-05-10 DIAGNOSIS — M5136 Other intervertebral disc degeneration, lumbar region: Secondary | ICD-10-CM

## 2019-05-10 DIAGNOSIS — M25552 Pain in left hip: Secondary | ICD-10-CM

## 2019-05-10 DIAGNOSIS — G8929 Other chronic pain: Secondary | ICD-10-CM

## 2019-05-10 MED ORDER — OXYCODONE-ACETAMINOPHEN 10-325 MG PO TABS: 1.0000 | ORAL_TABLET | ORAL | 0 refills | Status: DC | PRN

## 2019-05-10 NOTE — Progress Notes (Signed)
Virtual Visit via Telephone Note  I connected with Wendy Sanchez on 05/10/19 at 10:35 AM EDT by telephone and verified that I am speaking with the correct person using two identifiers.  Location: Patient: Home Provider: [facility] control center   I discussed the limitations, risks, security and privacy concerns of performing an evaluation and management service by telephone and the availability of in person appointments. I also discussed with the patient that there may be a patient responsible charge related to this service. The patient expressed understanding and agreed to proceed.   History of Present Illness: I spoke with Wendy Sanchez via telephone as she was unable to do the video portion of the virtual conference.  She reports that she has had a lot of low back pain similar to what she is experienced in the past.  Unfortunately she has failed to gain any significant relief from efforts at weight loss, physical therapy or previous interventional therapy with epidurals.  She is unable to take anti-inflammatory secondary to the Eliquis that she takes.  She is taking her medications as prescribed and these work well for her.  She reports that she did best on the 5 tablets/day regimen and had the best pain coverage with that and no side effects reported.  Otherwise she is in her usual state of health with no change in quality characteristic or distribution of pain at this time.    Observations/Objective:  Current Outpatient Medications:  .  albuterol (PROVENTIL HFA;VENTOLIN HFA) 108 (90 Base) MCG/ACT inhaler, Inhale 2 puffs into the lungs every 6 (six) hours as needed for wheezing or shortness of breath., Disp: , Rfl:  .  apixaban (ELIQUIS) 5 MG TABS tablet, Take 1 tablet (5 mg total) by mouth 2 (two) times daily., Disp: 60 tablet, Rfl: 0 .  Ascorbic Acid (VITAMIN C PO), Take 2 tablets by mouth daily., Disp: , Rfl:  .  aspirin EC 81 MG tablet, Take 1 tablet (81 mg total) daily by mouth. (Patient not  taking: Reported on 02/22/2019), Disp: 90 tablet, Rfl: 3 .  azelastine (ASTELIN) 0.1 % nasal spray, Place 1 spray into both nostrils 2 (two) times daily as needed for allergies. Use in each nostril as directed , Disp: , Rfl:  .  carvedilol (COREG) 6.25 MG tablet, Take 1 tablet (6.25 mg total) by mouth 2 (two) times daily., Disp: 180 tablet, Rfl: 3 .  cyclobenzaprine (FLEXERIL) 10 MG tablet, Take 1 tablet (10 mg total) by mouth 3 (three) times daily as needed. for muscle spams (Patient not taking: Reported on 02/28/2019), Disp: 270 tablet, Rfl: 1 .  Dextromethorphan-guaiFENesin (CORICIDIN HBP CONGESTION/COUGH) 10-200 MG CAPS, Take 2 tablets by mouth 2 (two) times daily as needed (cough / congestion)., Disp: , Rfl:  .  diclofenac sodium (VOLTAREN) 1 % GEL, Apply 2 g topically 4 (four) times daily as needed (pain). , Disp: , Rfl:  .  diphenhydrAMINE (BENADRYL) 25 mg capsule, Take 50 mg by mouth every 6 (six) hours as needed for allergies. , Disp: , Rfl:  .  docusate sodium (COLACE) 100 MG capsule, Take 100 mg by mouth at bedtime as needed for mild constipation. , Disp: , Rfl:  .  ELDERBERRY PO, Take 100 mg by mouth daily. , Disp: , Rfl:  .  fluconazole (DIFLUCAN) 100 MG tablet, Take 100 mg by mouth daily as needed (yeast infections)., Disp: , Rfl:  .  folic acid (FOLVITE) 1 MG tablet, Take 1 mg by mouth at bedtime. , Disp: , Rfl:  .  furosemide (LASIX) 40 MG tablet, Take 2 tablets (80 mg) by mouth once daily as directed (Patient taking differently: Take 80 mg by mouth daily. ), Disp: 180 tablet, Rfl: 3 .  gabapentin (NEURONTIN) 300 MG capsule, Take 2 capsules (600 mg total) by mouth See admin instructions. Take 600 mg by mouth twice daily, may take a third 600 mg dose as needed for pain, Disp: 540 capsule, Rfl: 0 .  gentamicin ointment (GARAMYCIN) 0.1 %, Apply 1 application topically 5 (five) times daily as needed (boil). , Disp: , Rfl:  .  Guaifenesin (MUCINEX MAXIMUM STRENGTH) 1200 MG TB12, Take 1,200 mg  by mouth 2 (two) times daily as needed (congestion)., Disp: , Rfl:  .  ipratropium-albuterol (DUONEB) 0.5-2.5 (3) MG/3ML SOLN, Take 3 mLs by nebulization every 6 (six) hours as needed (shortness of breath)., Disp: , Rfl:  .  levocetirizine (XYZAL) 5 MG tablet, Take 5 mg by mouth at bedtime. , Disp: , Rfl:  .  mirabegron ER (MYRBETRIQ) 50 MG TB24 tablet, Take 50 mg by mouth daily., Disp: , Rfl:  .  montelukast (SINGULAIR) 10 MG tablet, Take 10 mg by mouth at bedtime., Disp: , Rfl:  .  Multiple Vitamins-Minerals (MULTIVITAMIN GUMMIES ADULTS) CHEW, Chew 2 tablets by mouth daily. , Disp: , Rfl:  .  nortriptyline (PAMELOR) 50 MG capsule, Take 50 mg by mouth at bedtime., Disp: , Rfl:  .  nystatin (MYCOSTATIN) 100000 UNIT/ML suspension, Take 5 mLs by mouth 4 (four) times daily as needed (yeast). , Disp: , Rfl:  .  nystatin cream (MYCOSTATIN), Apply 1 application topically 2 (two) times daily as needed (yeast)., Disp: , Rfl:  .  ondansetron (ZOFRAN) 4 MG tablet, Take 1 tablet (4 mg total) by mouth every 8 (eight) hours as needed for up to 10 doses for nausea or vomiting., Disp: 20 tablet, Rfl: 0 .  [START ON 05/14/2019] oxyCODONE-acetaminophen (PERCOCET) 10-325 MG tablet, Take 1 tablet by mouth every 4 (four) hours as needed for pain., Disp: 135 tablet, Rfl: 0 .  [START ON 06/13/2019] oxyCODONE-acetaminophen (PERCOCET) 10-325 MG tablet, Take 1 tablet by mouth every 4 (four) hours as needed for pain., Disp: 135 tablet, Rfl: 0 .  pantoprazole (PROTONIX) 40 MG tablet, Take 40 mg by mouth 2 (two) times daily., Disp: , Rfl:  .  pilocarpine (SALAGEN) 5 MG tablet, Take 5 mg by mouth 2 (two) times daily. , Disp: , Rfl:  .  Polyvinyl Alcohol-Povidone (REFRESH OP), Place 1 drop into both eyes daily as needed (dry eyes)., Disp: , Rfl:  .  potassium chloride SA (K-DUR,KLOR-CON) 20 MEQ tablet, Take 20 mEq by mouth 3 days. , Disp: , Rfl:  .  Probiotic Product (PROBIOTIC COLON SUPPORT) CAPS, Take 1 capsule by mouth at  bedtime., Disp: , Rfl:  .  sacubitril-valsartan (ENTRESTO) 49-51 MG, Take 1 tablet by mouth 2 (two) times daily., Disp: , Rfl:  .  sucralfate (CARAFATE) 1 G tablet, Take 1 g by mouth 2 (two) times daily before a meal. , Disp: , Rfl:  .  umeclidinium-vilanterol (ANORO ELLIPTA) 62.5-25 MCG/INH AEPB, Inhale 1 puff into the lungs daily., Disp: , Rfl:  .  Vitamin D, Ergocalciferol, (DRISDOL) 50000 UNITS CAPS capsule, Take 50,000 Units by mouth every Thursday. , Disp: , Rfl:  .  Xylitol (XYLIMELTS) 550 MG DISK, Use as directed 550 mg in the mouth or throat daily., Disp: , Rfl:   Assessment and Plan:  1. Unilateral primary osteoarthritis, right knee   2. DDD (degenerative  disc disease), lumbar   3. Chronic bilateral low back pain with bilateral sciatica   4. Hip pain, bilateral   5. Chronic, continuous use of opioids   6. Chronic pain syndrome   7. Osteoarthritis of left knee, unspecified osteoarthritis type   8. Spinal stenosis of lumbar region with neurogenic claudication   Based on our discussion today upon review of the [facility] practitioner database information I am going to refill her medications for May 2 and June 1.  I want her to continue with her current regimen but we will allow her a 5 time a day dosing on half the days of the month for total of 135 tablets on a every 4 hour regimen for days where she has breakthrough pain.  We gone over this regimen extensively.  She understands risks and benefits of chronic opioid therapy.  We have talked about potential risks including respiratory depression and constipation amongst other things.  I want her to continue efforts at weight loss stretching strengthening exercises and I have made it available for an interventional epidural if necessary for sciatica-like symptoms.  She is to continue follow-up with her primary care physicians for baseline medical care. Follow Up Instructions:    I discussed the assessment and treatment plan with the  patient. The patient was provided an opportunity to ask questions and all were answered. The patient agreed with the plan and demonstrated an understanding of the instructions.   The patient was advised to call back or seek an in-person evaluation if the symptoms worsen or if the condition fails to improve as anticipated.  I provided 25 minutes of non-face-to-face time during this encounter.   Molli Barrows, MD

## 2019-05-30 DIAGNOSIS — R519 Headache, unspecified: Secondary | ICD-10-CM | POA: Insufficient documentation

## 2019-06-02 ENCOUNTER — Encounter (INDEPENDENT_AMBULATORY_CARE_PROVIDER_SITE_OTHER): Payer: Self-pay | Admitting: Nurse Practitioner

## 2019-06-02 ENCOUNTER — Ambulatory Visit (INDEPENDENT_AMBULATORY_CARE_PROVIDER_SITE_OTHER): Payer: Medicare Other | Admitting: Nurse Practitioner

## 2019-06-02 ENCOUNTER — Other Ambulatory Visit: Payer: Self-pay

## 2019-06-02 VITALS — BP 132/80 | HR 71 | Resp 16 | Ht 67.0 in | Wt 299.6 lb

## 2019-06-02 DIAGNOSIS — R519 Headache, unspecified: Secondary | ICD-10-CM

## 2019-06-02 DIAGNOSIS — I2782 Chronic pulmonary embolism: Secondary | ICD-10-CM | POA: Diagnosis not present

## 2019-06-02 DIAGNOSIS — M1712 Unilateral primary osteoarthritis, left knee: Secondary | ICD-10-CM | POA: Diagnosis not present

## 2019-06-02 NOTE — Progress Notes (Signed)
Subjective:    Patient ID: Wendy Sanchez, female    DOB: 1950-01-26, 69 y.o.   MRN: [id number] Chief Complaint  Patient presents with  . New Patient (Initial Visit)    ref Meda Coffee temporal biopsy    Wendy Sanchez is a 69 year old female that presents on referral from her rheumatologist Dr. Meda Coffee in regards to possible temporal arteritis.  The patient notes that she has had a consistent headache for approximately the last 8 to 10 days.  She states that sometimes it is in the frontal area and sometimes it transitions to a temporal headache.  The patient also does note that she has issues with neck stiffness and her C3, C4 and C5 refused.  The patient has tried Tylenol for pain relief however it has not been helpful at all.  The patient is unable to try ibuprofen due to currently being on Eliquis due to previous pulmonary embolism.  The patient was recently started on prednisone about 3 days ago by Dr. Fransisco Beau and she notes that her headache has begun to ease up some as well.  The patient also endorses some recent vision changes that have happened over the last month.  She recently went to see her ophthalmologist that noted that she had extremely dry eyes that was likely causing her sudden vision changes.  He also altered her prescription slightly.  She has not filled the glasses prescription and so she is unable to state whether it has improved will make changes.  The patient denies any issues with chewing or jaw pain.  She denies any fever, chills, nausea, vomiting or diarrhea.  Lab work done by her rheumatologist shows that her sed rate was elevated at 105 however the C-reactive protein was normal at 3.   Review of Systems  Musculoskeletal: Positive for arthralgias, neck pain and neck stiffness.  Neurological: Positive for headaches.  All other systems reviewed and are negative.      Objective:   Physical Exam Vitals reviewed.  Constitutional:      Appearance: She is obese.  Neck:   Vascular: No carotid bruit.  Cardiovascular:     Rate and Rhythm: Normal rate and regular rhythm.     Pulses: Normal pulses.     Heart sounds: Normal heart sounds.  Pulmonary:     Effort: Pulmonary effort is normal.     Breath sounds: Normal breath sounds.  Skin:    General: Skin is warm and dry.  Neurological:     Mental Status: She is alert and oriented to person, place, and time.  Psychiatric:        Mood and Affect: Mood normal.        Behavior: Behavior normal.        Thought Content: Thought content normal.        Judgment: Judgment normal.     BP 132/80 (BP Location: Right Arm)   Pulse 71   Resp 16   Ht 5\' 7"  (1.702 m)   Wt 299 lb 9.6 oz (135.9 kg)   BMI 46.92 kg/m   Past Medical History:  Diagnosis Date  . Allergic rhinitis   . Arthritis   . Asthma    problems with fumes and aerosols cause asthma  . Chest pain 10/19/2016  . Complication of anesthesia    awakens during surgery; has occurred with last 3-4 surgeries   . COPD (chronic obstructive pulmonary disease) ([place])   . DVT (deep venous thrombosis) ([place]) 07/13/2018   Right leg  .  Environmental allergies    fumes   . Fibromyalgia   . GERD (gastroesophageal reflux disease)   . Headache   . Hemorrhoids   . History of bronchitis   . Hx of total knee arthroplasty 12/13/2015  . Hypertension   . Morbid obesity with BMI of 45.0-49.9, adult ([place]) 07/25/2015  . NICM (nonischemic cardiomyopathy) ([place])    a. ? PVC mediated;  b. 06/2016 Echo: EF 25-30%, mild LVH, mild LAE, mild AI/MR/TR/PR; c. 08/2016 Cath: nl cors, EF 25%.  . Numbness    hands bilat when driving; improves when not preforming task   . Osteoarthritis of left knee 08/02/2015  . PE (pulmonary thromboembolism) ([place]) 07/13/2018  . Pes anserinus bursitis of left knee 05/18/2016  . Presence of right artificial knee joint 05/18/2016  . PVC (premature ventricular contraction) 06/04/2017  . PVC's (premature ventricular contractions)    a. 11/2016 [place] started;  b.  11/29/16 48h Holter: [id number] PVC's (41%); c. 12/2016 24h Holter: 18040 PVC's (15%); c. 02/2017 48h Holter: [id number] PVC's (41%).  Marland Kitchen Spinal stenosis of lumbar region 06/19/2015  . Status post gastric banding   . 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 Tinnitus    comes and goes   . Unilateral primary osteoarthritis, right knee 12/13/2015  . Vertigo    none for over 2 yrs    Social History   Socioeconomic History  . Marital status: Single    Spouse name: Not on file  . Number of children: Not on file  . Years of education: Not on file  . Highest education level: Not on file  Occupational History  . Not on file  Tobacco Use  . Smoking status: Current Every Day Smoker    Packs/day: 0.25    Years: 50.00    Pack years: 12.50    Types: Cigarettes    Last attempt to quit: 07/06/2018    Years since quitting: 0.9  . Smokeless tobacco: Current User    Types: Chew  Substance and Sexual Activity  . Alcohol use: No    Alcohol/week: 0.0 standard drinks  . Drug use: No  . Sexual activity: Not on file  Other Topics Concern  . Not on file  Social History Narrative  . Not on file   Social Determinants of Health   Financial Resource Strain:   . Difficulty of Paying Living Expenses:   Food Insecurity:   . Worried About Charity fundraiser in the Last Year:   . Arboriculturist in the Last Year:   Transportation Needs:   . Film/video editor (Medical):   Marland Kitchen Lack of Transportation (Non-Medical):   Physical Activity:   . Days of Exercise per Week:   . Minutes of Exercise per Session:   Stress:   . Feeling of Stress :   Social Connections:   . Frequency of Communication with Friends and Family:   . Frequency of Social Gatherings with Friends and Family:   . Attends Religious Services:   . Active Member of Clubs or Organizations:   . Attends Archivist Meetings:   Marland Kitchen Marital Status:   Intimate Partner Violence:   . Fear of Current or Ex-Partner:   . Emotionally  Abused:   Marland Kitchen Physically Abused:   . Sexually Abused:     Past Surgical History:  Procedure Laterality Date  . ABDOMINAL HYSTERECTOMY  1979   left ovary remains  . ABDOMINAL HYSTERECTOMY    . CHOLECYSTECTOMY    .  COLONOSCOPY    . fibrous tissue removed from right shoulder and back of neck      2 years ago   . FLOOR OF MOUTH BIOPSY N/A 02/28/2019   Procedure: SALIVARY GLAND BIOPSY;  Surgeon: Carloyn Manner, MD;  Location: [facility] ORS;  Service: ENT;  Laterality: N/A;  . GANGLION CYST EXCISION    . GASTRIC BYPASS  1980   states had bypass with banding and band left in -  . GASTRIC BYPASS OPEN    . JOINT REPLACEMENT Bilateral 2017   knees  . KNEE SURGERY Bilateral    both knees  2001  . NECK SURGERY N/A 1205/19   ganglion cyst removal, [place]   . PULMONARY THROMBECTOMY Bilateral 07/14/2018   Procedure: PULMONARY THROMBECTOMY;  Surgeon: Algernon Huxley, MD;  Location: [place] [facility];  Service: Cardiovascular;  Laterality: Bilateral;  . PVC ABLATION N/A 06/04/2017   Procedure: PVC ABLATION;  Surgeon: Evans Lance, MD;  Location: [place] [facility];  Service: Cardiovascular;  Laterality: N/A;  . RIGHT/LEFT HEART CATH AND CORONARY ANGIOGRAPHY Bilateral 08/12/2016   Procedure: Right/Left Heart Cath and Coronary Angiography;  Surgeon: Yolonda Kida, MD;  Location: [place] [facility];  Service: Cardiovascular;  Laterality: Bilateral;  . TONSILLECTOMY     age 45  . TOTAL KNEE ARTHROPLASTY Left 08/02/2015   Procedure: LEFT TOTAL KNEE ARTHROPLASTY;  Surgeon: Mcarthur Rossetti, MD;  Location: [facility];  Service: Orthopedics;  Laterality: Left;  . TOTAL KNEE ARTHROPLASTY Right 12/13/2015   Procedure: RIGHT TOTAL KNEE ARTHROPLASTY;  Surgeon: Mcarthur Rossetti, MD;  Location: [facility];  Service: Orthopedics;  Laterality: Right;    Family History  Problem Relation Age of Onset  . Hypertension Father   . Deep vein thrombosis Father   . Dementia Mother   . Diabetes Sister   .  Congestive Heart Failure Sister   . Congestive Heart Failure Brother   . Congestive Heart Failure Daughter   . Congestive Heart Failure Son   . Breast cancer Maternal Aunt        great aunt    Allergies  Allergen Reactions  . Hydrocodone-Acetaminophen Swelling  . Iodine Anaphylaxis and Swelling  . Other Other (See Comments)    ALLERGY TO METAL - BLACKENS SKIN AND CAUSES A RASH   . Tape Swelling and Other (See Comments)    Skin comes off.  Paper tape is ok  . Peanut Oil     ** Nuts cause runny nose  . Shellfish Allergy Nausea And Vomiting and Swelling    Episode of GI infection after eating clam chowder. Still eats shrimp and other seafood       Assessment & Plan:   1. Frontal headache Based upon the patient's description of symptoms as well as relief with steroids, it is definitely reasonable to think that the patient may have temporal arteritis.  I discussed with the patient the risk, benefits and alternatives of the biopsy in addition I have discussed the possibility of false negative as well as the decreasing sensitivity of the biopsy the longer that the patient is on steroids.  The patient does agree to proceed with a biopsy.  We will follow up in office with the patient following biopsy.  2. Chronic pulmonary embolism, unspecified pulmonary embolism type, unspecified whether acute cor pulmonale present [facility]) Pulmonary embolism was nearly 1 year ago.  Patient has done well adherent to anticoagulation.  Patient should be safe to hold Eliquis for several days  if necessary for temporal artery biopsy.  3. Osteoarthritis of left knee, unspecified osteoarthritis type Continue NSAID medications as already ordered, these medications have been reviewed and there are no changes at this time.  Continued activity and therapy was stressed.    Current Outpatient Medications on File Prior to Visit  Medication Sig Dispense Refill  . albuterol (PROVENTIL HFA;VENTOLIN HFA) 108 (90 Base)  MCG/ACT inhaler Inhale 2 puffs into the lungs every 6 (six) hours as needed for wheezing or shortness of breath.    Marland Kitchen apixaban (ELIQUIS) 5 MG TABS tablet Take 1 tablet (5 mg total) by mouth 2 (two) times daily. 60 tablet 0  . Ascorbic Acid (VITAMIN C PO) Take 2 tablets by mouth daily.    Marland Kitchen azelastine (ASTELIN) 0.1 % nasal spray Place 1 spray into both nostrils 2 (two) times daily as needed for allergies. Use in each nostril as directed     . carvedilol (COREG) 6.25 MG tablet Take 1 tablet (6.25 mg total) by mouth 2 (two) times daily. 180 tablet 3  . Dextromethorphan-guaiFENesin (CORICIDIN HBP CONGESTION/COUGH) 10-200 MG CAPS Take 2 tablets by mouth 2 (two) times daily as needed (cough / congestion).    Marland Kitchen diclofenac sodium (VOLTAREN) 1 % GEL Apply 2 g topically 4 (four) times daily as needed (pain).     Marland Kitchen diphenhydrAMINE (BENADRYL) 25 mg capsule Take 50 mg by mouth every 6 (six) hours as needed for allergies.     Marland Kitchen docusate sodium (COLACE) 100 MG capsule Take 100 mg by mouth at bedtime as needed for mild constipation.     Marland Kitchen ELDERBERRY PO Take 100 mg by mouth daily.     . fluconazole (DIFLUCAN) 100 MG tablet Take 100 mg by mouth daily as needed (yeast infections).    . folic acid (FOLVITE) 1 MG tablet Take 1 mg by mouth at bedtime.     . furosemide (LASIX) 40 MG tablet Take 2 tablets (80 mg) by mouth once daily as directed (Patient taking differently: Take 80 mg by mouth daily. ) 180 tablet 3  . gabapentin (NEURONTIN) 300 MG capsule Take 2 capsules (600 mg total) by mouth See admin instructions. Take 600 mg by mouth twice daily, may take a third 600 mg dose as needed for pain 540 capsule 0  . gentamicin ointment (GARAMYCIN) 0.1 % Apply 1 application topically 5 (five) times daily as needed (boil).     . Guaifenesin (MUCINEX MAXIMUM STRENGTH) 1200 MG TB12 Take 1,200 mg by mouth 2 (two) times daily as needed (congestion).    Marland Kitchen ipratropium-albuterol (DUONEB) 0.5-2.5 (3) MG/3ML SOLN Take 3 mLs by  nebulization every 6 (six) hours as needed (shortness of breath).    Marland Kitchen levocetirizine (XYZAL) 5 MG tablet Take 5 mg by mouth at bedtime.     . mirabegron ER (MYRBETRIQ) 50 MG TB24 tablet Take 50 mg by mouth daily.    . montelukast (SINGULAIR) 10 MG tablet Take 10 mg by mouth at bedtime.    . Multiple Vitamins-Minerals (MULTIVITAMIN GUMMIES ADULTS) CHEW Chew 2 tablets by mouth daily.     . nortriptyline (PAMELOR) 50 MG capsule Take 50 mg by mouth at bedtime.    Marland Kitchen nystatin (MYCOSTATIN) 100000 UNIT/ML suspension Take 5 mLs by mouth 4 (four) times daily as needed (yeast).     . nystatin cream (MYCOSTATIN) Apply 1 application topically 2 (two) times daily as needed (yeast).    . ondansetron (ZOFRAN) 4 MG tablet Take 1 tablet (4 mg total) by mouth  every 8 (eight) hours as needed for up to 10 doses for nausea or vomiting. 20 tablet 0  . oxyCODONE-acetaminophen (PERCOCET) 10-325 MG tablet Take 1 tablet by mouth every 4 (four) hours as needed for pain. 135 tablet 0  . [START ON 06/13/2019] oxyCODONE-acetaminophen (PERCOCET) 10-325 MG tablet Take 1 tablet by mouth every 4 (four) hours as needed for pain. 135 tablet 0  . pantoprazole (PROTONIX) 40 MG tablet Take 40 mg by mouth 2 (two) times daily.    . pilocarpine (SALAGEN) 5 MG tablet Take 5 mg by mouth 2 (two) times daily.     . Polyvinyl Alcohol-Povidone (REFRESH OP) Place 1 drop into both eyes daily as needed (dry eyes).    . potassium chloride SA (K-DUR,KLOR-CON) 20 MEQ tablet Take 20 mEq by mouth 3 days.     . Probiotic Product (PROBIOTIC COLON SUPPORT) CAPS Take 1 capsule by mouth at bedtime.    . sacubitril-valsartan (ENTRESTO) 49-51 MG Take 1 tablet by mouth 2 (two) times daily.    . sucralfate (CARAFATE) 1 G tablet Take 1 g by mouth 2 (two) times daily before a meal.     . umeclidinium-vilanterol (ANORO ELLIPTA) 62.5-25 MCG/INH AEPB Inhale 1 puff into the lungs daily.    . Vitamin D, Ergocalciferol, (DRISDOL) 50000 UNITS CAPS capsule Take 50,000  Units by mouth every Thursday.     . Xylitol (XYLIMELTS) 550 MG DISK Use as directed 550 mg in the mouth or throat as needed.     Marland Kitchen aspirin EC 81 MG tablet Take 1 tablet (81 mg total) daily by mouth. (Patient not taking: Reported on 02/22/2019) 90 tablet 3  . cyclobenzaprine (FLEXERIL) 10 MG tablet Take 1 tablet (10 mg total) by mouth 3 (three) times daily as needed. for muscle spams (Patient not taking: Reported on 02/28/2019) 270 tablet 1   No current facility-administered medications on file prior to visit.    There are no Patient Instructions on file for this visit. No follow-ups on file.   Kris Hartmann, NP

## 2019-06-06 ENCOUNTER — Other Ambulatory Visit: Payer: Self-pay

## 2019-06-06 ENCOUNTER — Other Ambulatory Visit
Admission: RE | Admit: 2019-06-06 | Discharge: 2019-06-06 | Disposition: A | Discharge status: Home / Self Care | Payer: Medicare Other | Source: Ambulatory Visit | Attending: Vascular Surgery | Admitting: Vascular Surgery

## 2019-06-06 ENCOUNTER — Telehealth (INDEPENDENT_AMBULATORY_CARE_PROVIDER_SITE_OTHER): Payer: Self-pay

## 2019-06-06 ENCOUNTER — Encounter
Admission: RE | Admit: 2019-06-06 | Discharge: 2019-06-06 | Disposition: A | Discharge status: Home / Self Care | Payer: Medicare Other | Source: Ambulatory Visit | Attending: Vascular Surgery | Admitting: Vascular Surgery

## 2019-06-06 ENCOUNTER — Other Ambulatory Visit (INDEPENDENT_AMBULATORY_CARE_PROVIDER_SITE_OTHER): Payer: Self-pay | Admitting: Nurse Practitioner

## 2019-06-06 DIAGNOSIS — Z01812 Encounter for preprocedural laboratory examination: Secondary | ICD-10-CM | POA: Insufficient documentation

## 2019-06-06 DIAGNOSIS — Z20822 Contact with and (suspected) exposure to covid-19: Secondary | ICD-10-CM | POA: Insufficient documentation

## 2019-06-06 HISTORY — DX: Sleep apnea, unspecified: G47.30

## 2019-06-06 LAB — SARS CORONAVIRUS 2 (TAT 6-24 HRS): SARS Coronavirus 2: NEGATIVE

## 2019-06-06 NOTE — Telephone Encounter (Signed)
Spoke with the patient about her left temporal biopsy surgery with Dr. Lucky Cowboy for 06/07/19. Patient was told to be NPO after 12 midnight tonight, she was asked to go today before 11:00 am to have her covid test at the [facility],  she was given the number to call same day surgery today between 1-3 pm and she was given the information that preadmission would be calling her today as well as the number to contact me directly if she had any other questions.

## 2019-06-06 NOTE — Patient Instructions (Signed)
Your procedure is scheduled on: tomorrow Report to Day Surgery.at 1:00  Remember: Instructions that are not followed completely may result in serious medical risk,  up to and including death, or upon the discretion of your surgeon and anesthesiologist your  surgery may need to be rescheduled.     _X__ 1. Do not eat food after midnight the night before your procedure.                 No gum chewing or hard candies. You may drink clear liquids up to 2 hours                 before you are scheduled to arrive for your surgery- DO not drink clear                 liquids within 2 hours of the start of your surgery.                 Clear Liquids include:  water, apple juice without pulp, clear Gatorade, G2 or                  Gatorade Zero (avoid Red/Purple/Blue), Black Coffee or Tea (Do not add                 anything to coffee or tea). _____2.   Complete the carbohydrate drink provided to you, 2 hours before arrival.  __X__2.  On the morning of surgery brush your teeth with toothpaste and water, you                may rinse your mouth with mouthwash if you wish.  Do not swallow any toothpaste of mouthwash.     __ 3.  No Alcohol for 24 hours before or after surgery.   _X__ 4.  Do Not Smoke or use e-cigarettes For 24 Hours Prior to Your Surgery.                 Do not use any chewable tobacco products for at least 6 hours prior to                 Surgery.  ___  5.  Do not use any recreational drugs (marijuana, cocaine, heroin, ecstacy, MDMA or other)                For at least one week prior to your surgery.  Combination of these drugs with anesthesia                May have life threatening results.  ____  6.  Bring all medications with you on the day of surgery if instructed.   __x__  7.  Notify your doctor if there is any change in your medical condition      (cold, fever, infections).     Do not wear jewelry, make-up, hairpins, clips or nail polish. Do not  wear lotions, powders, or perfumes. . Do not shave 48 hours prior to surgery. Do not bring valuables to the hospital.    [facility] is not responsible for any belongings or valuables.  Contacts, dentures or bridgework may not be worn into surgery. Leave your suitcase in the car. After surgery it may be brought to your room. For patients admitted to the hospital, discharge time is determined by your treatment team.   Patients discharged the day of surgery will not be allowed to drive home.   Make arrangements for someone to be with you  for the first 24 hours of your Same Day Discharge.    Please read over the following fact sheets that you were given:    __x__ Take these medicines the morning of surgery with A SIP OF WATER:    1. azelastine (ASTELIN) 0.1 % nasal spray  2. carvedilol (COREG) 6.25 MG tablet  3. cyclobenzaprine (FLEXERIL) 10 MG tablet if needed  4.gabapentin (NEURONTIN) 300 MG capsule if needed  5.pantoprazole (PROTONIX) 40 MG tablet  6.pilocarpine (SALAGEN) 5 MG tablet             7. May use nausea, pain, allergy medication or dry eye drops if needed  ____ Fleet Enema (as directed)   _x___Shower the night before and morning of your surgery  ____ Use Benzoyl Peroxide Gel as instructed  __x__ Use inhalers on the day of surgery albuterol (PROVENTIL HFA;VENTOLIN HFA) 108 (90 Base) MCG/ACT inhaler and bring with you umeclidinium-vilanterol (ANORO ELLIPTA) 62.5-25 MCG/INH AEPB  ____ Stop metformin 2 days prior to surgery    ____ Take 1/2 of usual insulin dose the night before surgery. No insulin the morning          of surgery.   __x__ continued eliquis and aspirin.  Do not take the morning of surgery    __x__ Stopped ibuprofen already    __x__ Stop supplements until after surgery.    ____ Bring C-Pap to the hospital.

## 2019-06-07 ENCOUNTER — Encounter: Payer: Self-pay | Admitting: Vascular Surgery

## 2019-06-07 ENCOUNTER — Ambulatory Visit: Payer: Medicare Other | Admitting: Registered Nurse

## 2019-06-07 ENCOUNTER — Other Ambulatory Visit: Payer: Self-pay

## 2019-06-07 ENCOUNTER — Encounter
Admission: RE | Disposition: A | Discharge status: Home / Self Care | Payer: Self-pay | Source: Home / Self Care | Attending: Vascular Surgery

## 2019-06-07 ENCOUNTER — Ambulatory Visit
Admission: RE | Admit: 2019-06-07 | Discharge: 2019-06-07 | Disposition: A | Discharge status: Home / Self Care | Payer: Medicare Other | Attending: Vascular Surgery | Admitting: Vascular Surgery

## 2019-06-07 DIAGNOSIS — Z86711 Personal history of pulmonary embolism: Secondary | ICD-10-CM | POA: Diagnosis not present

## 2019-06-07 DIAGNOSIS — K219 Gastro-esophageal reflux disease without esophagitis: Secondary | ICD-10-CM | POA: Insufficient documentation

## 2019-06-07 DIAGNOSIS — I509 Heart failure, unspecified: Secondary | ICD-10-CM | POA: Insufficient documentation

## 2019-06-07 DIAGNOSIS — Z6841 Body Mass Index (BMI) 40.0 and over, adult: Secondary | ICD-10-CM | POA: Diagnosis not present

## 2019-06-07 DIAGNOSIS — I110 Hypertensive heart disease with heart failure: Secondary | ICD-10-CM | POA: Insufficient documentation

## 2019-06-07 DIAGNOSIS — Z86718 Personal history of other venous thrombosis and embolism: Secondary | ICD-10-CM | POA: Diagnosis not present

## 2019-06-07 DIAGNOSIS — Z85528 Personal history of other malignant neoplasm of kidney: Secondary | ICD-10-CM | POA: Diagnosis not present

## 2019-06-07 DIAGNOSIS — J309 Allergic rhinitis, unspecified: Secondary | ICD-10-CM | POA: Insufficient documentation

## 2019-06-07 DIAGNOSIS — Z7901 Long term (current) use of anticoagulants: Secondary | ICD-10-CM | POA: Diagnosis not present

## 2019-06-07 DIAGNOSIS — Z96653 Presence of artificial knee joint, bilateral: Secondary | ICD-10-CM | POA: Diagnosis not present

## 2019-06-07 DIAGNOSIS — M797 Fibromyalgia: Secondary | ICD-10-CM | POA: Insufficient documentation

## 2019-06-07 DIAGNOSIS — Z9884 Bariatric surgery status: Secondary | ICD-10-CM | POA: Insufficient documentation

## 2019-06-07 DIAGNOSIS — J449 Chronic obstructive pulmonary disease, unspecified: Secondary | ICD-10-CM | POA: Diagnosis not present

## 2019-06-07 DIAGNOSIS — I428 Other cardiomyopathies: Secondary | ICD-10-CM | POA: Diagnosis not present

## 2019-06-07 DIAGNOSIS — R519 Headache, unspecified: Secondary | ICD-10-CM | POA: Insufficient documentation

## 2019-06-07 DIAGNOSIS — Z79899 Other long term (current) drug therapy: Secondary | ICD-10-CM | POA: Insufficient documentation

## 2019-06-07 DIAGNOSIS — R51 Headache with orthostatic component, not elsewhere classified: Secondary | ICD-10-CM | POA: Diagnosis not present

## 2019-06-07 HISTORY — PX: ARTERY BIOPSY: SHX891

## 2019-06-07 LAB — BASIC METABOLIC PANEL
Anion gap: 8 (ref 5–15)
BUN: 19 mg/dL (ref 8–23)
CO2: 30 mmol/L (ref 22–32)
Calcium: 8.9 mg/dL (ref 8.9–10.3)
Chloride: 101 mmol/L (ref 98–111)
Creatinine, Ser: 0.85 mg/dL (ref 0.44–1.00)
GFR calc Af Amer: >60 mL/min (ref >60)
GFR calc non Af Amer: >60 mL/min (ref >60)
Glucose, Bld: 93 mg/dL (ref 70–99)
Potassium: 3.7 mmol/L (ref 3.5–5.1)
Sodium: 139 mmol/L (ref 135–145)

## 2019-06-07 LAB — TYPE AND SCREEN
ABO/RH(D): O POS
Antibody Screen: NEGATIVE

## 2019-06-07 LAB — CBC WITH DIFFERENTIAL/PLATELET
Abs Immature Granulocytes: 0.04 10*3/uL (ref 0.00–0.07)
Basophils Absolute: 0 10*3/uL (ref 0.0–0.1)
Basophils Relative: 0 %
Eosinophils Absolute: 0.1 10*3/uL (ref 0.0–0.5)
Eosinophils Relative: 1 %
HCT: 40.3 % (ref 36.0–46.0)
Hemoglobin: 12.7 g/dL (ref 12.0–15.0)
Immature Granulocytes: 0 %
Lymphocytes Relative: 42 %
Lymphs Abs: 3.9 10*3/uL (ref 0.7–4.0)
MCH: 27.9 pg (ref 26.0–34.0)
MCHC: 31.5 g/dL (ref 30.0–36.0)
MCV: 88.6 fL (ref 80.0–100.0)
Monocytes Absolute: 0.8 10*3/uL (ref 0.1–1.0)
Monocytes Relative: 9 %
Neutro Abs: 4.3 10*3/uL (ref 1.7–7.7)
Neutrophils Relative %: 48 %
Platelets: 348 10*3/uL (ref 150–400)
RBC: 4.55 MIL/uL (ref 3.87–5.11)
RDW: 15.6 % — ABNORMAL HIGH (ref 11.5–15.5)
WBC: 9.1 10*3/uL (ref 4.0–10.5)
nRBC: 0 % (ref 0.0–0.2)

## 2019-06-07 LAB — APTT: aPTT: 28 seconds (ref 24–36)

## 2019-06-07 LAB — ABO/RH: ABO/RH(D): O POS

## 2019-06-07 LAB — PROTIME-INR
INR: 1 (ref 0.8–1.2)
Prothrombin Time: 12.8 seconds (ref 11.4–15.2)

## 2019-06-07 SURGERY — BIOPSY TEMPORAL ARTERY
Anesthesia: General | Laterality: Left

## 2019-06-07 MED ORDER — PROPOFOL 10 MG/ML IV BOLUS
INTRAVENOUS | Status: DC | PRN
  Administered 2019-06-07: 40 mg via INTRAVENOUS

## 2019-06-07 MED ORDER — LIDOCAINE-EPINEPHRINE 1 %-1:100000 IJ SOLN
INTRAMUSCULAR | Status: AC
  Filled 2019-06-07: qty 1

## 2019-06-07 MED ORDER — FENTANYL CITRATE (PF) 100 MCG/2ML IJ SOLN
INTRAMUSCULAR | Status: DC | PRN
  Administered 2019-06-07 (×2): 25 ug via INTRAVENOUS

## 2019-06-07 MED ORDER — CEFAZOLIN SODIUM-DEXTROSE 2-4 GM/100ML-% IV SOLN
INTRAVENOUS | Status: AC
  Filled 2019-06-07: qty 100

## 2019-06-07 MED ORDER — CEFAZOLIN SODIUM-DEXTROSE 2-4 GM/100ML-% IV SOLN
2.0000 g | INTRAVENOUS | Status: AC
  Administered 2019-06-07: 2 g via INTRAVENOUS

## 2019-06-07 MED ORDER — OXYCODONE-ACETAMINOPHEN 10-325 MG PO TABS: 1.0000 | ORAL_TABLET | ORAL | 0 refills | Status: AC | PRN

## 2019-06-07 MED ORDER — SUGAMMADEX SODIUM 500 MG/5ML IV SOLN
INTRAVENOUS | Status: AC
  Filled 2019-06-07: qty 5

## 2019-06-07 MED ORDER — LIDOCAINE-EPINEPHRINE 1 %-1:100000 IJ SOLN
INTRAMUSCULAR | Status: DC | PRN
  Administered 2019-06-07: 7 mL

## 2019-06-07 MED ORDER — CHLORHEXIDINE GLUCONATE 0.12 % MT SOLN: 15.0000 mL | Freq: Once | OROMUCOSAL | Status: DC

## 2019-06-07 MED ORDER — LACTATED RINGERS IV SOLN: INTRAVENOUS | Status: DC

## 2019-06-07 MED ORDER — MIDAZOLAM HCL 2 MG/2ML IJ SOLN
INTRAMUSCULAR | Status: AC
  Filled 2019-06-07: qty 2

## 2019-06-07 MED ORDER — PROPOFOL 500 MG/50ML IV EMUL
INTRAVENOUS | Status: AC
  Filled 2019-06-07: qty 50

## 2019-06-07 MED ORDER — ORAL CARE MOUTH RINSE: 15.0000 mL | Freq: Once | OROMUCOSAL | Status: DC

## 2019-06-07 MED ORDER — LIDOCAINE HCL (PF) 2 % IJ SOLN
INTRAMUSCULAR | Status: AC
  Filled 2019-06-07: qty 5

## 2019-06-07 MED ORDER — MIDAZOLAM HCL 2 MG/2ML IJ SOLN
INTRAMUSCULAR | Status: DC | PRN
  Administered 2019-06-07 (×2): 1 mg via INTRAVENOUS

## 2019-06-07 MED ORDER — PROPOFOL 500 MG/50ML IV EMUL
INTRAVENOUS | Status: DC | PRN
  Administered 2019-06-07: 100 ug/kg/min via INTRAVENOUS

## 2019-06-07 MED ORDER — CHLORHEXIDINE GLUCONATE CLOTH 2 % EX PADS: 6.0000 | MEDICATED_PAD | Freq: Once | CUTANEOUS | Status: DC

## 2019-06-07 MED ORDER — FENTANYL CITRATE (PF) 100 MCG/2ML IJ SOLN
INTRAMUSCULAR | Status: AC
  Filled 2019-06-07: qty 2

## 2019-06-07 MED ORDER — KETOROLAC TROMETHAMINE 30 MG/ML IJ SOLN
INTRAMUSCULAR | Status: AC
  Filled 2019-06-07: qty 1

## 2019-06-07 SURGICAL SUPPLY — 41 items
BLADE SURG 15 STRL LF DISP TIS (BLADE) ×1 IMPLANT
BLADE SURG 15 STRL SS (BLADE) ×3
BLADE SURG SZ11 CARB STEEL (BLADE) ×3 IMPLANT
CNTNR SPEC 2.5X3XGRAD LEK (MISCELLANEOUS)
CONT SPEC 4OZ STER OR WHT (MISCELLANEOUS)
CONT SPEC 4OZ STRL OR WHT (MISCELLANEOUS)
CONTAINER SPEC 2.5X3XGRAD LEK (MISCELLANEOUS) IMPLANT
COTTON BALL STRL MEDIUM (GAUZE/BANDAGES/DRESSINGS) ×3 IMPLANT
COVER WAND RF STERILE (DRAPES) ×3 IMPLANT
DERMABOND ADVANCED (GAUZE/BANDAGES/DRESSINGS) ×2
DERMABOND ADVANCED .7 DNX12 (GAUZE/BANDAGES/DRESSINGS) ×1 IMPLANT
DRAPE LAPAROTOMY 77X122 PED (DRAPES) ×3 IMPLANT
DRSG TELFA 4X3 1S NADH ST (GAUZE/BANDAGES/DRESSINGS) ×3 IMPLANT
ELECT CAUTERY BLADE 6.4 (BLADE) ×3 IMPLANT
ELECT REM PT RETURN 9FT ADLT (ELECTROSURGICAL) ×3
ELECTRODE REM PT RTRN 9FT ADLT (ELECTROSURGICAL) ×1 IMPLANT
GLOVE BIO SURGEON STRL SZ7 (GLOVE) ×9 IMPLANT
GOWN STRL REUS W/ TWL LRG LVL3 (GOWN DISPOSABLE) ×2 IMPLANT
GOWN STRL REUS W/ TWL XL LVL3 (GOWN DISPOSABLE) ×1 IMPLANT
GOWN STRL REUS W/TWL LRG LVL3 (GOWN DISPOSABLE) ×6
GOWN STRL REUS W/TWL XL LVL3 (GOWN DISPOSABLE) ×3
KIT TURNOVER KIT A (KITS) ×3 IMPLANT
LABEL OR SOLS (LABEL) ×3 IMPLANT
NEEDLE HYPO 25X1 1.5 SAFETY (NEEDLE) IMPLANT
NS IRRIG 500ML POUR BTL (IV SOLUTION) ×3 IMPLANT
PACK BASIN MINOR (MISCELLANEOUS) ×3 IMPLANT
SOL PREP PVP 2OZ (MISCELLANEOUS) ×3
SOLUTION PREP PVP 2OZ (MISCELLANEOUS) ×1 IMPLANT
SUCTION FRAZIER HANDLE 10FR (MISCELLANEOUS) ×3
SUCTION TUBE FRAZIER 10FR DISP (MISCELLANEOUS) ×1 IMPLANT
SUT MNCRL AB 4-0 PS2 18 (SUTURE) ×3 IMPLANT
SUT SILK 2 0 (SUTURE) ×3
SUT SILK 2-0 18XBRD TIE 12 (SUTURE) ×1 IMPLANT
SUT SILK 3 0 (SUTURE)
SUT SILK 3-0 18XBRD TIE 12 (SUTURE) IMPLANT
SUT SILK 4 0 (SUTURE) ×3
SUT SILK 4-0 18XBRD TIE 12 (SUTURE) ×1 IMPLANT
SUT VIC AB 3-0 SH 27 (SUTURE) ×3
SUT VIC AB 3-0 SH 27X BRD (SUTURE) ×1 IMPLANT
SYR 10ML LL (SYRINGE) IMPLANT
SYR BULB IRRIG 60ML STRL (SYRINGE) ×3 IMPLANT

## 2019-06-07 NOTE — H&P (Signed)
[facility] History & Physical Update  The patient was interviewed and re-examined.  The patient's previous History and Physical has been reviewed and is unchanged.  There is no change in the plan of care. We plan to proceed with the scheduled procedure.  Leotis Pain, MD  06/07/2019, 8:20 AM

## 2019-06-07 NOTE — Op Note (Signed)
        OPERATIVE NOTE   PRE-OPERATIVE DIAGNOSIS: suspected temporal arteritis  POST-OPERATIVE DIAGNOSIS: Same as above  PROCEDURE: 1.   Left temporal artery biopsy  SURGEON: Leotis Pain, MD  ASSISTANT(S): none  ANESTHESIA: MAC  ESTIMATED BLOOD LOSS: Minimal  FINDING(S): 1.  none  SPECIMEN(S):  Left superficial temporal artery sent to pathology  INDICATIONS:   Patient is a 70 y.o. female who presents with headaches and visual symptoms worrisome for temporal arteritis.  We were consulted for consideration for temporal artery biopsy. Risks and benefits were discussed and he was agreeable to proceed.  DESCRIPTION: After obtaining full informed written consent, the patient was brought back to the operating room and placed supine upon the operating table.  The patient received IV antibiotics prior to induction.  After obtaining adequate anesthesia, the patient was prepped and draped in the standard fashion. The area in front of his left ear was anesthetized copiously with a solution of 1% lidocaine and half percent Marcaine without epinephrine. I then made an incision just in front of the left ear overlying the palpable pulse. I then dissected down through the subcutaneous tissues and identified the superficial temporal artery. This was dissected out over a several centimeters and branches were ligated and divided between silk ties. Care was used to avoid electrocautery around the artery. I then clamped the artery proximally and distally and transected the artery. The specimen was then sent to pathology. The proximal and distal artery were ligated with 3-0 silk ties. Hemostasis was achieved. The wound was then closed with a series of interrupted 3-0 Vicryl's and the skin was closed with a 4-0 Monocryl. Sterile dressing was placed. The patient was taken to the recovery room in stable condition having tolerated the procedure well.  COMPLICATIONS: None  CONDITION: Stable   Leotis Pain 06/07/2019 10:48 AM  This note was created with Dragon Medical transcription system. Any errors in dictation are purely unintentional.

## 2019-06-07 NOTE — OR Nursing (Signed)
Per Dr. Lucky Cowboy secure chat, pt may resume aspirin and eliquis tonight.  Added to d/c instructions, med section.

## 2019-06-07 NOTE — Transfer of Care (Signed)
Immediate Anesthesia Transfer of Care Note  Patient: Wendy Sanchez  Procedure(s) Performed: BIOPSY TEMPORAL ARTERY (Left )  Patient Location: PACU  Anesthesia Type:General  Level of Consciousness: sedated  Airway & Oxygen Therapy: Patient Spontanous Breathing  Post-op Assessment: Report given to RN and Post -op Vital signs reviewed and stable  Post vital signs: Reviewed and stable  Last Vitals:  Vitals Value Taken Time  BP    Temp    Pulse 69 06/07/19 1052  Resp 14 06/07/19 1052  SpO2 100 % 06/07/19 1052  Vitals shown include unvalidated device data.  Last Pain:  Vitals:   06/07/19 0854  PainSc: 8          Complications: No apparent anesthesia complications

## 2019-06-07 NOTE — Anesthesia Postprocedure Evaluation (Signed)
Anesthesia Post Note  Patient: Wendy Sanchez  Procedure(s) Performed: BIOPSY TEMPORAL ARTERY (Left )  Patient location during evaluation: PACU Anesthesia Type: General Level of consciousness: awake and alert Pain management: pain level controlled Vital Signs Assessment: post-procedure vital signs reviewed and stable Respiratory status: spontaneous breathing, nonlabored ventilation, respiratory function stable and patient connected to nasal cannula oxygen Cardiovascular status: blood pressure returned to baseline and stable Postop Assessment: no apparent nausea or vomiting Anesthetic complications: no     Last Vitals:  Vitals:   06/07/19 1131 06/07/19 1148  BP: 135/76 140/75  Pulse: (!) 59 (!) 53  Resp: 14 16  Temp: (!) 36.1 C (!) 36.1 C  SpO2: 100% 100%    Last Pain:  Vitals:   06/07/19 1148  TempSrc: Temporal  PainSc: 3                  Martha Clan

## 2019-06-07 NOTE — Discharge Instructions (Signed)

## 2019-06-07 NOTE — Anesthesia Preprocedure Evaluation (Signed)
Anesthesia Evaluation  Patient identified by MRN, date of birth, ID band Patient awake    Reviewed: Allergy & Precautions, H&P , NPO status , Patient's Chart, lab work & pertinent test results  History of Anesthesia Complications (+) AWARENESS UNDER ANESTHESIA and history of anesthetic complications  Airway Mallampati: III  TM Distance: >3 FB     Dental  (+) Chipped, Dental Advidsory Given   Pulmonary asthma , COPD, neg recent URI, Current Smoker and Patient abstained from smoking., former smoker,  H/o PE          Cardiovascular hypertension, (-) angina+CHF    H/o frequent PVC's s/p ablation  Echo 09/08/17: - Left ventricle: The cavity size was mildly dilated. Systolic  function was severely reduced. The estimated ejection fraction  was in the range of 20% to 25%. Diffuse hypokinesis. Regional  wall motion abnormalities cannot be excluded. Doppler parameters  are consistent with abnormal left ventricular relaxation (grade 1  diastolic dysfunction).  - Mitral valve: There was mild regurgitation.  - Left atrium: The atrium was mildly dilated.  - Right ventricle: Systolic function was normal.  - Pulmonary arteries: Systolic pressure was within the normal  range.    Neuro/Psych  Headaches, negative psych ROS   GI/Hepatic Neg liver ROS, GERD  ,  Endo/Other  Morbid obesity  Renal/GU Renal cell carcinoma     Musculoskeletal   Abdominal   Peds  Hematology negative hematology ROS (+)   Anesthesia Other Findings Past Medical History: No date: Allergic rhinitis No date: Arthritis No date: Asthma     Comment:  problems with fumes and aerosols cause asthma 10/19/2016: Chest pain No date: Complication of anesthesia     Comment:  awakens during surgery; has occurred with last 3-4               surgeries  No date: COPD (chronic obstructive pulmonary disease) ([facility]) 07/13/2018: DVT (deep venous thrombosis)  ([facility])     Comment:  Right leg No date: Environmental allergies     Comment:  fumes  No date: Fibromyalgia No date: GERD (gastroesophageal reflux disease) No date: Headache No date: Hemorrhoids No date: History of bronchitis 12/13/2015: Hx of total knee arthroplasty No date: Hypertension 07/25/2015: Morbid obesity with BMI of 45.0-49.9, adult ([place]) No date: NICM (nonischemic cardiomyopathy) ([place])     Comment:  a. ? PVC mediated;  b. 06/2016 Echo: EF 25-30%, mild LVH,              mild LAE, mild AI/MR/TR/PR; c. 08/2016 Cath: nl cors, EF               25%. No date: Numbness     Comment:  hands bilat when driving; improves when not preforming               task  08/02/2015: Osteoarthritis of left knee 07/13/2018: PE (pulmonary thromboembolism) ([place]) 05/18/2016: Pes anserinus bursitis of left knee 05/18/2016: Presence of right artificial knee joint 06/04/2017: PVC (premature ventricular contraction) No date: PVC's (premature ventricular contractions)     Comment:  a. 11/2016 [place] started;  b. 11/29/16 48h Holter: 57574               PVC's (41%); c. 12/2016 24h Holter: 18040 PVC's (15%); c.              02/2017 48h Holter: 37262 PVC's (41%). 06/19/2015: Spinal stenosis of lumbar region No date: Status post gastric banding 08/02/2015: Status post total left knee replacement No date:  Tinnitus     Comment:  comes and goes  12/13/2015: Unilateral primary osteoarthritis, right knee No date: Vertigo     Comment:  none for over 2 yrs  Past Surgical History: 1979: ABDOMINAL HYSTERECTOMY     Comment:  left ovary remains No date: ABDOMINAL HYSTERECTOMY No date: CHOLECYSTECTOMY No date: COLONOSCOPY No date: fibrous tissue removed from right shoulder and back of neck      Comment:  2 years ago  No date: GANGLION CYST EXCISION 1980: GASTRIC BYPASS     Comment:  states had bypass with banding and band left in - No date: GASTRIC BYPASS OPEN 2017: JOINT REPLACEMENT; Bilateral     Comment:  knees No  date: KNEE SURGERY; Bilateral     Comment:  both knees  2001 1205/19: NECK SURGERY; N/A     Comment:  ganglion cyst removal, benigh  07/14/2018: PULMONARY THROMBECTOMY; Bilateral     Comment:  Procedure: PULMONARY THROMBECTOMY;  Surgeon: Algernon Huxley, MD;  Location: [facility];  Service:               Cardiovascular;  Laterality: Bilateral; 06/04/2017: PVC ABLATION; N/A     Comment:  Procedure: PVC ABLATION;  Surgeon: Evans Lance, MD;               Location: [facility];  Service: Cardiovascular;                Laterality: N/A; 08/12/2016: RIGHT/LEFT HEART CATH AND CORONARY ANGIOGRAPHY; Bilateral     Comment:  Procedure: Right/Left Heart Cath and Coronary               Angiography;  Surgeon: Yolonda Kida, MD;  Location:              [facility];  Service: Cardiovascular;                Laterality: Bilateral; No date: TONSILLECTOMY     Comment:  age 6 08/02/2015: TOTAL KNEE ARTHROPLASTY; Left     Comment:  Procedure: LEFT TOTAL KNEE ARTHROPLASTY;  Surgeon:               Mcarthur Rossetti, MD;  Location: [facility];  Service:               Orthopedics;  Laterality: Left; 12/13/2015: TOTAL KNEE ARTHROPLASTY; Right     Comment:  Procedure: RIGHT TOTAL KNEE ARTHROPLASTY;  Surgeon:               Mcarthur Rossetti, MD;  Location: [facility];  Service:               Orthopedics;  Laterality: Right;  BMI    Body Mass Index: 45.73 kg/m      Reproductive/Obstetrics negative OB ROS                             Anesthesia Physical  Anesthesia Plan  ASA: III  Anesthesia Plan: General   Post-op Pain Management:    Induction: Intravenous  PONV Risk Score and Plan: 3 and Treatment may vary due to age or medical condition, Propofol infusion and TIVA  Airway Management Planned: Natural Airway and Nasal Cannula  Additional Equipment:   Intra-op Plan:   Post-operative Plan:   Informed Consent: I have reviewed  the patients History  and Physical, chart, labs and discussed the procedure including the risks, benefits and alternatives for the proposed anesthesia with the patient or authorized representative who has indicated his/her understanding and acceptance.     Dental Advisory Given  Plan Discussed with: Anesthesiologist  Anesthesia Plan Comments:         Anesthesia Quick Evaluation

## 2019-06-08 LAB — SURGICAL PATHOLOGY

## 2019-06-09 NOTE — Progress Notes (Signed)
Can we fax a copy of this to her rheumatologist at [phone]. Dr. Meda Coffee. Thanks!

## 2019-06-21 ENCOUNTER — Other Ambulatory Visit: Payer: Self-pay

## 2019-06-21 ENCOUNTER — Encounter (INDEPENDENT_AMBULATORY_CARE_PROVIDER_SITE_OTHER): Payer: Self-pay | Admitting: Nurse Practitioner

## 2019-06-21 ENCOUNTER — Ambulatory Visit (INDEPENDENT_AMBULATORY_CARE_PROVIDER_SITE_OTHER): Payer: Medicare Other | Admitting: Nurse Practitioner

## 2019-06-21 VITALS — BP 110/71 | HR 78 | Ht 67.0 in | Wt 303.0 lb

## 2019-06-21 DIAGNOSIS — R519 Headache, unspecified: Secondary | ICD-10-CM | POA: Diagnosis not present

## 2019-06-21 DIAGNOSIS — I2782 Chronic pulmonary embolism: Secondary | ICD-10-CM

## 2019-06-21 DIAGNOSIS — M1712 Unilateral primary osteoarthritis, left knee: Secondary | ICD-10-CM | POA: Diagnosis not present

## 2019-06-21 NOTE — Progress Notes (Signed)
Subjective:    Patient ID: Wendy Sanchez, female    DOB: Apr 01, 1950, 69 y.o.   MRN: [id number] Chief Complaint  Patient presents with  . Follow-up    2 week [facility] wound check    Wendy Sanchez is a 69 year old female that presents on referral from her rheumatologist Dr. Meda Coffee in regards to possible temporal arteritis.  The patient recently underwent a left temporal artery biopsy which was negative.  These results were faxed to the patient's rheumatologist.  The patient denies any significant issues following the biopsy.  The wound site is clean dry and intact with no evidence of infection.  The Dermabond is still mostly intact at this time.  The patient denies any chest pain or shortness of breath following intervention.  The patient does note however that she has continued on steroid therapy and her headaches have decreased.      Review of Systems  Cardiovascular: Positive for leg swelling.  Musculoskeletal: Positive for arthralgias.  Skin: Positive for wound.  Neurological: Positive for headaches.  All other systems reviewed and are negative.      Objective:   Physical Exam Vitals reviewed.  Constitutional:      Appearance: She is obese.  Cardiovascular:     Rate and Rhythm: Normal rate and regular rhythm.     Pulses: Normal pulses.     Heart sounds: Normal heart sounds.  Pulmonary:     Effort: Pulmonary effort is normal.     Breath sounds: Normal breath sounds.  Musculoskeletal:     Right lower leg: 2+ Pitting Edema present.     Left lower leg: 2+ Pitting Edema present.  Neurological:     Mental Status: She is alert and oriented to person, place, and time.  Psychiatric:        Mood and Affect: Mood normal.        Behavior: Behavior normal.        Thought Content: Thought content normal.        Judgment: Judgment normal.     BP 110/71   Pulse 78   Ht 5\' 7"  (1.702 m)   Wt (!) 303 lb (137.4 kg)   BMI 47.46 kg/m   Past Medical History:  Diagnosis Date  .  Allergic rhinitis   . Arthritis   . Asthma    problems with fumes and aerosols cause asthma  . Chest pain 10/19/2016  . Complication of anesthesia    awakens during surgery; has occurred with last 3-4 surgeries   . COPD (chronic obstructive pulmonary disease) ([place])   . DVT (deep venous thrombosis) ([place]) 07/13/2018   Right leg  . Environmental allergies    fumes   . Fibromyalgia   . GERD (gastroesophageal reflux disease)   . Headache   . Hemorrhoids   . History of bronchitis   . Hx of total knee arthroplasty 12/13/2015  . Hypertension   . Morbid obesity with BMI of 45.0-49.9, adult ([place]) 07/25/2015  . NICM (nonischemic cardiomyopathy) ([place])    a. ? PVC mediated;  b. 06/2016 Echo: EF 25-30%, mild LVH, mild LAE, mild AI/MR/TR/PR; c. 08/2016 Cath: nl cors, EF 25%.  . Numbness    hands bilat when driving; improves when not preforming task   . Osteoarthritis of left knee 08/02/2015  . PE (pulmonary thromboembolism) ([place]) 07/13/2018  . Pes anserinus bursitis of left knee 05/18/2016  . Presence of right artificial knee joint 05/18/2016  . PVC (premature ventricular contraction) 06/04/2017  . PVC's (  premature ventricular contractions)    a. 11/2016 [place] started;  b. 11/29/16 48h Holter: 35329 PVC's (41%); c. 12/2016 24h Holter: 18040 PVC's (15%); c. 02/2017 48h Holter: 37262 PVC's (41%).  . Sleep apnea    cannot tolerate CPAP  . Spinal stenosis of lumbar region 06/19/2015  . Status post gastric banding   . 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 Tinnitus    comes and goes   . Unilateral primary osteoarthritis, right knee 12/13/2015  . Vertigo    none for over 2 yrs    Social History   Socioeconomic History  . Marital status: Single    Spouse name: Not on file  . Number of children: Not on file  . Years of education: Not on file  . Highest education level: Not on file  Occupational History  . Not on file  Tobacco Use  . Smoking status: Former Smoker    Packs/day: 0.25     Years: 50.00    Pack years: 12.50    Types: Cigarettes    Quit date: 07/06/2018    Years since quitting: 0.9  . Smokeless tobacco: Current User    Types: Chew  Substance and Sexual Activity  . Alcohol use: No    Alcohol/week: 0.0 standard drinks  . Drug use: No  . Sexual activity: Not on file  Other Topics Concern  . Not on file  Social History Narrative  . Not on file   Social Determinants of Health   Financial Resource Strain:   . Difficulty of Paying Living Expenses:   Food Insecurity:   . Worried About Charity fundraiser in the Last Year:   . Arboriculturist in the Last Year:   Transportation Needs:   . Film/video editor (Medical):   Marland Kitchen Lack of Transportation (Non-Medical):   Physical Activity:   . Days of Exercise per Week:   . Minutes of Exercise per Session:   Stress:   . Feeling of Stress :   Social Connections:   . Frequency of Communication with Friends and Family:   . Frequency of Social Gatherings with Friends and Family:   . Attends Religious Services:   . Active Member of Clubs or Organizations:   . Attends Archivist Meetings:   Marland Kitchen Marital Status:   Intimate Partner Violence:   . Fear of Current or Ex-Partner:   . Emotionally Abused:   Marland Kitchen Physically Abused:   . Sexually Abused:     Past Surgical History:  Procedure Laterality Date  . ABDOMINAL HYSTERECTOMY  1979   left ovary remains  . ABDOMINAL HYSTERECTOMY    . ARTERY BIOPSY Left 06/07/2019   Procedure: BIOPSY TEMPORAL ARTERY;  Surgeon: Algernon Huxley, MD;  Location: [facility];  Service: Vascular;  Laterality: Left;  . CHOLECYSTECTOMY    . COLONOSCOPY    . fibrous tissue removed from right shoulder and back of neck      2 years ago   . FLOOR OF MOUTH BIOPSY N/A 02/28/2019   Procedure: SALIVARY GLAND BIOPSY;  Surgeon: Carloyn Manner, MD;  Location: [facility];  Service: ENT;  Laterality: N/A;  . FRACTURE SURGERY     left small finger  . GANGLION CYST EXCISION    . GASTRIC BYPASS   1980   states had bypass with banding and band left in -  . GASTRIC BYPASS OPEN    . JOINT REPLACEMENT Bilateral 2017   knees  . KNEE SURGERY  Bilateral    both knees  2001  . NECK SURGERY N/A 1205/19   ganglion cyst removal, [facility]   . PULMONARY THROMBECTOMY Bilateral 07/14/2018   Procedure: PULMONARY THROMBECTOMY;  Surgeon: Algernon Huxley, MD;  Location: [facility];  Service: Cardiovascular;  Laterality: Bilateral;  . PVC ABLATION N/A 06/04/2017   Procedure: PVC ABLATION;  Surgeon: Evans Lance, MD;  Location: [facility];  Service: Cardiovascular;  Laterality: N/A;  . RIGHT/LEFT HEART CATH AND CORONARY ANGIOGRAPHY Bilateral 08/12/2016   Procedure: Right/Left Heart Cath and Coronary Angiography;  Surgeon: Yolonda Kida, MD;  Location: [facility];  Service: Cardiovascular;  Laterality: Bilateral;  . TONSILLECTOMY     age 76  . TOTAL KNEE ARTHROPLASTY Left 08/02/2015   Procedure: LEFT TOTAL KNEE ARTHROPLASTY;  Surgeon: Mcarthur Rossetti, MD;  Location: [facility];  Service: Orthopedics;  Laterality: Left;  . TOTAL KNEE ARTHROPLASTY Right 12/13/2015   Procedure: RIGHT TOTAL KNEE ARTHROPLASTY;  Surgeon: Mcarthur Rossetti, MD;  Location: [facility];  Service: Orthopedics;  Laterality: Right;    Family History  Problem Relation Age of Onset  . Hypertension Father   . Deep vein thrombosis Father   . Dementia Mother   . Diabetes Sister   . Congestive Heart Failure Sister   . Congestive Heart Failure Brother   . Congestive Heart Failure Daughter   . Congestive Heart Failure Son   . Breast cancer Maternal Aunt        great aunt    Allergies  Allergen Reactions  . Hydrocodone-Acetaminophen Swelling    hands  . Iodine Anaphylaxis and Swelling    Iv dye  . Other Other (See Comments)    ALLERGY TO METAL - BLACKENS SKIN AND CAUSES A RASH   . Tape Swelling and Other (See Comments)    Skin comes off.  Paper tape is ok tegaderm OK  . Peanut Oil Other (See  Comments)    ** Nuts cause runny nose  . Shellfish Allergy Nausea And Vomiting and Swelling    Episode of GI infection after eating clam chowder. Still eats shrimp and other seafood       Assessment & Plan:   1. Frontal headache Patient's biopsy results were negative for temporal arteritis.  I discussed with the patient the sensitivity of the test and in some instances false negatives are possible.  Since the patient has been on steroid therapy however the headaches have decreased and become more tolerable.  The patient will continue to follow with her rheumatologist for management.  Patient will follow up on an as-needed basis.  2. Osteoarthritis of left knee, unspecified osteoarthritis type Continue NSAID medications as already ordered, these medications have been reviewed and there are no changes at this time.  Continued activity and therapy was stressed.   3. Chronic pulmonary embolism, unspecified pulmonary embolism type, unspecified whether acute cor pulmonale present [facility]) Patient held Eliquis for procedure and has resumed without issue.   Current Outpatient Medications on File Prior to Visit  Medication Sig Dispense Refill  . albuterol (PROVENTIL HFA;VENTOLIN HFA) 108 (90 Base) MCG/ACT inhaler Inhale 2 puffs into the lungs every 6 (six) hours as needed for wheezing or shortness of breath.    . Ascorbic Acid (VITAMIN C PO) Take 2 tablets by mouth daily.    Marland Kitchen aspirin EC 81 MG tablet Take 1 tablet (81 mg total) daily by mouth. 90 tablet 3  . azelastine (ASTELIN) 0.1 % nasal spray  Place 1 spray into both nostrils 2 (two) times daily as needed for allergies. Use in each nostril as directed     . carvedilol (COREG) 6.25 MG tablet Take 1 tablet (6.25 mg total) by mouth 2 (two) times daily. 180 tablet 3  . cyclobenzaprine (FLEXERIL) 10 MG tablet Take 1 tablet (10 mg total) by mouth 3 (three) times daily as needed. for muscle spams 270 tablet 1  . Dextromethorphan-guaiFENesin (CORICIDIN  HBP CONGESTION/COUGH) 10-200 MG CAPS Take 2 tablets by mouth 2 (two) times daily as needed (cough / congestion).    Marland Kitchen diclofenac sodium (VOLTAREN) 1 % GEL Apply 2 g topically 4 (four) times daily as needed (pain).     Marland Kitchen diphenhydrAMINE (BENADRYL) 25 mg capsule Take 50 mg by mouth every 6 (six) hours as needed for allergies.     Marland Kitchen docusate sodium (COLACE) 100 MG capsule Take 100 mg by mouth at bedtime as needed for mild constipation.     Marland Kitchen ELDERBERRY PO Take 100 mg by mouth daily.     . fluconazole (DIFLUCAN) 100 MG tablet Take 100 mg by mouth daily as needed (yeast infections).    . folic acid (FOLVITE) 1 MG tablet Take 1 mg by mouth at bedtime.     . furosemide (LASIX) 40 MG tablet Take 2 tablets (80 mg) by mouth once daily as directed (Patient taking differently: Take 80 mg by mouth daily. ) 180 tablet 3  . gabapentin (NEURONTIN) 300 MG capsule Take 2 capsules (600 mg total) by mouth See admin instructions. Take 600 mg by mouth twice daily, may take a third 600 mg dose as needed for pain 540 capsule 0  . gentamicin ointment (GARAMYCIN) 0.1 % Apply 1 application topically 5 (five) times daily as needed (boil).     . Guaifenesin (MUCINEX MAXIMUM STRENGTH) 1200 MG TB12 Take 1,200 mg by mouth 2 (two) times daily as needed (congestion).    Marland Kitchen ipratropium-albuterol (DUONEB) 0.5-2.5 (3) MG/3ML SOLN Take 3 mLs by nebulization every 6 (six) hours as needed (shortness of breath).    Marland Kitchen levocetirizine (XYZAL) 5 MG tablet Take 5 mg by mouth at bedtime.     . mirabegron ER (MYRBETRIQ) 50 MG TB24 tablet Take 50 mg by mouth daily.    . montelukast (SINGULAIR) 10 MG tablet Take 10 mg by mouth at bedtime.    . Multiple Vitamins-Minerals (MULTIVITAMIN GUMMIES ADULTS) CHEW Chew 2 tablets by mouth daily.     . nortriptyline (PAMELOR) 50 MG capsule Take 50 mg by mouth at bedtime.    Marland Kitchen nystatin (MYCOSTATIN) 100000 UNIT/ML suspension Take 5 mLs by mouth 4 (four) times daily as needed (yeast).     . nystatin cream  (MYCOSTATIN) Apply 1 application topically 2 (two) times daily as needed (yeast).    . ondansetron (ZOFRAN) 4 MG tablet Take 1 tablet (4 mg total) by mouth every 8 (eight) hours as needed for up to 10 doses for nausea or vomiting. 20 tablet 0  . oxyCODONE-acetaminophen (PERCOCET) 10-325 MG tablet Take 1 tablet by mouth every 4 (four) hours as needed for pain. 135 tablet 0  . pantoprazole (PROTONIX) 40 MG tablet Take 40 mg by mouth 2 (two) times daily.    . pilocarpine (SALAGEN) 5 MG tablet Take 5 mg by mouth 2 (two) times daily.     . Polyvinyl Alcohol-Povidone (REFRESH OP) Place 1 drop into both eyes daily as needed (dry eyes).    . potassium chloride SA (K-DUR,KLOR-CON) 20 MEQ tablet Take  20 mEq by mouth 3 days.     . predniSONE (DELTASONE) 20 MG tablet Take 20 mg by mouth daily. 3 tabs daily at night    . Probiotic Product (PROBIOTIC COLON SUPPORT) CAPS Take 1 capsule by mouth at bedtime.    . sacubitril-valsartan (ENTRESTO) 49-51 MG Take 1 tablet by mouth 2 (two) times daily.    . sucralfate (CARAFATE) 1 G tablet Take 1 g by mouth 2 (two) times daily before a meal.     . umeclidinium-vilanterol (ANORO ELLIPTA) 62.5-25 MCG/INH AEPB Inhale 1 puff into the lungs daily.    . Vitamin D, Ergocalciferol, (DRISDOL) 50000 UNITS CAPS capsule Take 50,000 Units by mouth every Thursday.     . Xylitol (XYLIMELTS) 550 MG DISK Use as directed 550 mg in the mouth or throat as needed.     Marland Kitchen apixaban (ELIQUIS) 5 MG TABS tablet Take 1 tablet (5 mg total) by mouth 2 (two) times daily. 60 tablet 0   No current facility-administered medications on file prior to visit.    There are no Patient Instructions on file for this visit. No follow-ups on file.   Kris Hartmann, NP

## 2019-07-04 LAB — TOXASSURE SELECT 13 (MW), URINE

## 2019-07-13 ENCOUNTER — Other Ambulatory Visit: Payer: Self-pay

## 2019-07-13 ENCOUNTER — Encounter: Payer: Self-pay | Admitting: Anesthesiology

## 2019-07-13 ENCOUNTER — Ambulatory Visit: Payer: Medicare Other | Attending: Anesthesiology | Admitting: Anesthesiology

## 2019-07-13 DIAGNOSIS — M25551 Pain in right hip: Secondary | ICD-10-CM

## 2019-07-13 DIAGNOSIS — M48062 Spinal stenosis, lumbar region with neurogenic claudication: Secondary | ICD-10-CM

## 2019-07-13 DIAGNOSIS — M5442 Lumbago with sciatica, left side: Secondary | ICD-10-CM

## 2019-07-13 DIAGNOSIS — G8929 Other chronic pain: Secondary | ICD-10-CM

## 2019-07-13 DIAGNOSIS — F1190 Opioid use, unspecified, uncomplicated: Secondary | ICD-10-CM

## 2019-07-13 DIAGNOSIS — M5136 Other intervertebral disc degeneration, lumbar region: Secondary | ICD-10-CM

## 2019-07-13 DIAGNOSIS — M25552 Pain in left hip: Secondary | ICD-10-CM

## 2019-07-13 DIAGNOSIS — M5441 Lumbago with sciatica, right side: Secondary | ICD-10-CM

## 2019-07-13 DIAGNOSIS — M1711 Unilateral primary osteoarthritis, right knee: Secondary | ICD-10-CM | POA: Diagnosis not present

## 2019-07-13 DIAGNOSIS — M1712 Unilateral primary osteoarthritis, left knee: Secondary | ICD-10-CM

## 2019-07-13 DIAGNOSIS — G894 Chronic pain syndrome: Secondary | ICD-10-CM

## 2019-07-13 MED ORDER — OXYCODONE-ACETAMINOPHEN 10-325 MG PO TABS: 1.0000 | ORAL_TABLET | ORAL | 0 refills | Status: DC | PRN

## 2019-07-13 MED ORDER — OXYCODONE-ACETAMINOPHEN 10-325 MG PO TABS: 1.0000 | ORAL_TABLET | ORAL | 0 refills | Status: AC | PRN

## 2019-07-14 NOTE — Progress Notes (Signed)
Virtual Visit via Telephone Note  I connected with Wendy Sanchez on 07/14/19 at  2:30 PM EDT by telephone and verified that I am speaking with the correct person using two identifiers.  Location: Patient: Home Provider: Pain control   I discussed the limitations, risks, security and privacy concerns of performing an evaluation and management service by telephone and the availability of in person appointments. I also discussed with the patient that there may be a patient responsible charge related to this service. The patient expressed understanding and agreed to proceed.   History of Present Illness: I spoke with Wendy Sanchez today via telephone.  She was unable to do the video portion of the virtual conference and she reports that she has been doing reasonably well with the new pain medication regimen.  She is taking her medications as prescribed and they continue to give her good relief.  Based on her description she is continued to derive good functional benefit from the medicines and they are keeping her pain under decent control.  She is trying to do her physical therapy and staying active but she is struggling with this.  Furthermore she has recently had to have a biopsy for probable temporal arteritis and some headaches.  In regards to her low back pain it is been stable with no changes in the symptom quality characteristic or distribution.  Her bowel and bladder function been stable as well.    Observations/Objective:  Current Outpatient Medications:  .  albuterol (PROVENTIL HFA;VENTOLIN HFA) 108 (90 Base) MCG/ACT inhaler, Inhale 2 puffs into the lungs every 6 (six) hours as needed for wheezing or shortness of breath., Disp: , Rfl:  .  apixaban (ELIQUIS) 5 MG TABS tablet, Take 1 tablet (5 mg total) by mouth 2 (two) times daily., Disp: 60 tablet, Rfl: 0 .  Ascorbic Acid (VITAMIN C PO), Take 2 tablets by mouth daily., Disp: , Rfl:  .  aspirin EC 81 MG tablet, Take 1 tablet (81 mg total)  daily by mouth., Disp: 90 tablet, Rfl: 3 .  azelastine (ASTELIN) 0.1 % nasal spray, Place 1 spray into both nostrils 2 (two) times daily as needed for allergies. Use in each nostril as directed , Disp: , Rfl:  .  carvedilol (COREG) 6.25 MG tablet, Take 1 tablet (6.25 mg total) by mouth 2 (two) times daily., Disp: 180 tablet, Rfl: 3 .  cyclobenzaprine (FLEXERIL) 10 MG tablet, Take 1 tablet (10 mg total) by mouth 3 (three) times daily as needed. for muscle spams, Disp: 270 tablet, Rfl: 1 .  Dextromethorphan-guaiFENesin (CORICIDIN HBP CONGESTION/COUGH) 10-200 MG CAPS, Take 2 tablets by mouth 2 (two) times daily as needed (cough / congestion)., Disp: , Rfl:  .  diclofenac sodium (VOLTAREN) 1 % GEL, Apply 2 g topically 4 (four) times daily as needed (pain). , Disp: , Rfl:  .  diphenhydrAMINE (BENADRYL) 25 mg capsule, Take 50 mg by mouth every 6 (six) hours as needed for allergies. , Disp: , Rfl:  .  docusate sodium (COLACE) 100 MG capsule, Take 100 mg by mouth at bedtime as needed for mild constipation. , Disp: , Rfl:  .  ELDERBERRY PO, Take 100 mg by mouth daily. , Disp: , Rfl:  .  fluconazole (DIFLUCAN) 100 MG tablet, Take 100 mg by mouth daily as needed (yeast infections)., Disp: , Rfl:  .  folic acid (FOLVITE) 1 MG tablet, Take 1 mg by mouth at bedtime. , Disp: , Rfl:  .  furosemide (LASIX) 40 MG  tablet, Take 2 tablets (80 mg) by mouth once daily as directed (Patient taking differently: Take 80 mg by mouth daily. ), Disp: 180 tablet, Rfl: 3 .  gabapentin (NEURONTIN) 300 MG capsule, Take 2 capsules (600 mg total) by mouth See admin instructions. Take 600 mg by mouth twice daily, may take a third 600 mg dose as needed for pain, Disp: 540 capsule, Rfl: 0 .  gentamicin ointment (GARAMYCIN) 0.1 %, Apply 1 application topically 5 (five) times daily as needed (boil). , Disp: , Rfl:  .  Guaifenesin (MUCINEX MAXIMUM STRENGTH) 1200 MG TB12, Take 1,200 mg by mouth 2 (two) times daily as needed (congestion)., Disp:  , Rfl:  .  ipratropium-albuterol (DUONEB) 0.5-2.5 (3) MG/3ML SOLN, Take 3 mLs by nebulization every 6 (six) hours as needed (shortness of breath)., Disp: , Rfl:  .  levocetirizine (XYZAL) 5 MG tablet, Take 5 mg by mouth at bedtime. , Disp: , Rfl:  .  mirabegron ER (MYRBETRIQ) 50 MG TB24 tablet, Take 50 mg by mouth daily., Disp: , Rfl:  .  montelukast (SINGULAIR) 10 MG tablet, Take 10 mg by mouth at bedtime., Disp: , Rfl:  .  Multiple Vitamins-Minerals (MULTIVITAMIN GUMMIES ADULTS) CHEW, Chew 2 tablets by mouth daily. , Disp: , Rfl:  .  nortriptyline (PAMELOR) 50 MG capsule, Take 50 mg by mouth at bedtime., Disp: , Rfl:  .  nystatin (MYCOSTATIN) 100000 UNIT/ML suspension, Take 5 mLs by mouth 4 (four) times daily as needed (yeast). , Disp: , Rfl:  .  nystatin cream (MYCOSTATIN), Apply 1 application topically 2 (two) times daily as needed (yeast)., Disp: , Rfl:  .  ondansetron (ZOFRAN) 4 MG tablet, Take 1 tablet (4 mg total) by mouth every 8 (eight) hours as needed for up to 10 doses for nausea or vomiting., Disp: 20 tablet, Rfl: 0 .  oxyCODONE-acetaminophen (PERCOCET) 10-325 MG tablet, Take 1 tablet by mouth every 4 (four) hours as needed for pain., Disp: 135 tablet, Rfl: 0 .  [START ON 08/12/2019] oxyCODONE-acetaminophen (PERCOCET) 10-325 MG tablet, Take 1 tablet by mouth every 4 (four) hours as needed for pain., Disp: 135 tablet, Rfl: 0 .  pantoprazole (PROTONIX) 40 MG tablet, Take 40 mg by mouth 2 (two) times daily., Disp: , Rfl:  .  pilocarpine (SALAGEN) 5 MG tablet, Take 5 mg by mouth 2 (two) times daily. , Disp: , Rfl:  .  Polyvinyl Alcohol-Povidone (REFRESH OP), Place 1 drop into both eyes daily as needed (dry eyes)., Disp: , Rfl:  .  potassium chloride SA (K-DUR,KLOR-CON) 20 MEQ tablet, Take 20 mEq by mouth 3 days. , Disp: , Rfl:  .  predniSONE (DELTASONE) 20 MG tablet, Take 20 mg by mouth daily. 3 tabs daily at night, Disp: , Rfl:  .  Probiotic Product (PROBIOTIC COLON SUPPORT) CAPS, Take 1  capsule by mouth at bedtime., Disp: , Rfl:  .  sacubitril-valsartan (ENTRESTO) 49-51 MG, Take 1 tablet by mouth 2 (two) times daily., Disp: , Rfl:  .  sucralfate (CARAFATE) 1 G tablet, Take 1 g by mouth 2 (two) times daily before a meal. , Disp: , Rfl:  .  umeclidinium-vilanterol (ANORO ELLIPTA) 62.5-25 MCG/INH AEPB, Inhale 1 puff into the lungs daily., Disp: , Rfl:  .  Vitamin D, Ergocalciferol, (DRISDOL) 50000 UNITS CAPS capsule, Take 50,000 Units by mouth every Thursday. , Disp: , Rfl:  .  Xylitol (XYLIMELTS) 550 MG DISK, Use as directed 550 mg in the mouth or throat as needed. , Disp: , Rfl:  Assessment and Plan: 1. Unilateral primary osteoarthritis, right knee   2. DDD (degenerative disc disease), lumbar   3. Chronic bilateral low back pain with bilateral sciatica   4. Hip pain, bilateral   5. Chronic, continuous use of opioids   6. Chronic pain syndrome   7. Osteoarthritis of left knee, unspecified osteoarthritis type   8. Spinal stenosis of lumbar region with neurogenic claudication   Based on our discussion today I think is reasonable to continue her current medications and I have reviewed the [facility] practitioner database information and it is appropriate.  I will refill her medications for the next 2 months this be dated for July 1 and July 31 with return to clinic in 2 months.  She also had a recent UDS which was appropriate.  She is to continue follow-up with her primary care physicians as well with no further changes regarding pain management at this time.  Follow Up Instructions:    I discussed the assessment and treatment plan with the patient. The patient was provided an opportunity to ask questions and all were answered. The patient agreed with the plan and demonstrated an understanding of the instructions.   The patient was advised to call back or seek an in-person evaluation if the symptoms worsen or if the condition fails to improve as anticipated.  I provided 30  minutes of non-face-to-face time during this encounter.   Molli Barrows, MD

## 2019-07-26 ENCOUNTER — Other Ambulatory Visit: Payer: Self-pay | Admitting: Anesthesiology

## 2019-08-12 ENCOUNTER — Other Ambulatory Visit: Payer: Self-pay | Admitting: Internal Medicine

## 2019-08-27 ENCOUNTER — Other Ambulatory Visit: Payer: Self-pay | Admitting: Internal Medicine

## 2019-09-04 ENCOUNTER — Encounter: Payer: Self-pay | Admitting: Anesthesiology

## 2019-09-04 ENCOUNTER — Other Ambulatory Visit: Payer: Self-pay

## 2019-09-04 ENCOUNTER — Ambulatory Visit: Payer: Medicare Other | Attending: Anesthesiology | Admitting: Anesthesiology

## 2019-09-04 DIAGNOSIS — M5441 Lumbago with sciatica, right side: Secondary | ICD-10-CM

## 2019-09-04 DIAGNOSIS — M48062 Spinal stenosis, lumbar region with neurogenic claudication: Secondary | ICD-10-CM

## 2019-09-04 DIAGNOSIS — M5136 Other intervertebral disc degeneration, lumbar region: Secondary | ICD-10-CM

## 2019-09-04 DIAGNOSIS — M25551 Pain in right hip: Secondary | ICD-10-CM

## 2019-09-04 DIAGNOSIS — M5442 Lumbago with sciatica, left side: Secondary | ICD-10-CM | POA: Diagnosis not present

## 2019-09-04 DIAGNOSIS — M1711 Unilateral primary osteoarthritis, right knee: Secondary | ICD-10-CM

## 2019-09-04 DIAGNOSIS — M25552 Pain in left hip: Secondary | ICD-10-CM

## 2019-09-04 DIAGNOSIS — F1190 Opioid use, unspecified, uncomplicated: Secondary | ICD-10-CM

## 2019-09-04 DIAGNOSIS — G894 Chronic pain syndrome: Secondary | ICD-10-CM

## 2019-09-04 DIAGNOSIS — G8929 Other chronic pain: Secondary | ICD-10-CM

## 2019-09-04 DIAGNOSIS — M1712 Unilateral primary osteoarthritis, left knee: Secondary | ICD-10-CM

## 2019-09-04 MED ORDER — OXYCODONE-ACETAMINOPHEN 10-325 MG PO TABS: 1.0000 | ORAL_TABLET | ORAL | 0 refills | Status: AC | PRN

## 2019-09-04 MED ORDER — OXYCODONE-ACETAMINOPHEN 10-325 MG PO TABS: 1.0000 | ORAL_TABLET | ORAL | 0 refills | Status: DC | PRN

## 2019-09-04 NOTE — Telephone Encounter (Signed)
This is a [name] pt 

## 2019-09-04 NOTE — Progress Notes (Signed)
Virtual Visit via Telephone Note  I connected with Wendy Sanchez on 09/04/19 at  2:45 PM EDT by telephone and verified that I am speaking with the correct person using two identifiers.  Location: Patient: Home Provider:  [facility] control center   I discussed the limitations, risks, security and privacy concerns of performing an evaluation and management service by telephone and the availability of in person appointments. I also discussed with the patient that there may be a patient responsible charge related to this service. The patient expressed understanding and agreed to proceed.   History of Present Illness: I spoke with Wendy Sanchez via telephone as we were unable to link up for the video portion of the virtual conference and she reports that she has been stable with her low back pain.  She is currently taking the Percocet 10 325 tablets approximately every 4 hours or 5 tablets a day.  She had to stop her ibuprofen secondary to going on to Eliquis and this is caused an increase overall pain.  Her current oxycodone dose is covering this well.  She reports no side effects with this.  Based on our discussion she continues to derive good functional benefit from the opioid medication protocol.  She does report that she has had some cold chills and lack of energy.  She is scheduled to go see her primary care physician at 330 this afternoon for evaluation.  In regards to her low back pain it is of the same quality characteristic and distribution of what she is experienced in the past.  This is been reasonably well-tolerated with the medications.  She has been more recumbent and less active with the Covid crisis.  She has gained some weight.  No change in lower extremity strength or function or bowel or bladder function is noted.   Observations/Objective:  Current Outpatient Medications:  .  albuterol (PROVENTIL HFA;VENTOLIN HFA) 108 (90 Base) MCG/ACT inhaler, Inhale 2 puffs into the lungs every 6 (six)  hours as needed for wheezing or shortness of breath., Disp: , Rfl:  .  apixaban (ELIQUIS) 5 MG TABS tablet, Take 1 tablet (5 mg total) by mouth 2 (two) times daily., Disp: 60 tablet, Rfl: 0 .  Ascorbic Acid (VITAMIN C PO), Take 2 tablets by mouth daily., Disp: , Rfl:  .  aspirin EC 81 MG tablet, Take 1 tablet (81 mg total) daily by mouth., Disp: 90 tablet, Rfl: 3 .  azelastine (ASTELIN) 0.1 % nasal spray, Place 1 spray into both nostrils 2 (two) times daily as needed for allergies. Use in each nostril as directed , Disp: , Rfl:  .  carvedilol (COREG) 6.25 MG tablet, Take 1 tablet (6.25 mg total) by mouth 2 (two) times daily., Disp: 180 tablet, Rfl: 3 .  cyclobenzaprine (FLEXERIL) 10 MG tablet, Take 1 tablet (10 mg total) by mouth 3 (three) times daily as needed. for muscle spams, Disp: 270 tablet, Rfl: 1 .  Dextromethorphan-guaiFENesin (CORICIDIN HBP CONGESTION/COUGH) 10-200 MG CAPS, Take 2 tablets by mouth 2 (two) times daily as needed (cough / congestion)., Disp: , Rfl:  .  diclofenac sodium (VOLTAREN) 1 % GEL, Apply 2 g topically 4 (four) times daily as needed (pain). , Disp: , Rfl:  .  diphenhydrAMINE (BENADRYL) 25 mg capsule, Take 50 mg by mouth every 6 (six) hours as needed for allergies. , Disp: , Rfl:  .  docusate sodium (COLACE) 100 MG capsule, Take 100 mg by mouth at bedtime as needed for mild constipation. , Disp: ,  Rfl:  .  ELDERBERRY PO, Take 100 mg by mouth daily. , Disp: , Rfl:  .  fluconazole (DIFLUCAN) 100 MG tablet, Take 100 mg by mouth daily as needed (yeast infections)., Disp: , Rfl:  .  folic acid (FOLVITE) 1 MG tablet, Take 1 mg by mouth at bedtime. , Disp: , Rfl:  .  furosemide (LASIX) 40 MG tablet, Take 2 tablets (80 mg) by mouth once daily as directed (Patient taking differently: Take 80 mg by mouth daily. ), Disp: 180 tablet, Rfl: 3 .  gabapentin (NEURONTIN) 300 MG capsule, Take 2 capsules (600 mg total) by mouth See admin instructions. Take 600 mg by mouth twice daily, may  take a third 600 mg dose as needed for pain, Disp: 540 capsule, Rfl: 0 .  gentamicin ointment (GARAMYCIN) 0.1 %, Apply 1 application topically 5 (five) times daily as needed (boil). , Disp: , Rfl:  .  Guaifenesin (MUCINEX MAXIMUM STRENGTH) 1200 MG TB12, Take 1,200 mg by mouth 2 (two) times daily as needed (congestion)., Disp: , Rfl:  .  ipratropium-albuterol (DUONEB) 0.5-2.5 (3) MG/3ML SOLN, Take 3 mLs by nebulization every 6 (six) hours as needed (shortness of breath)., Disp: , Rfl:  .  levocetirizine (XYZAL) 5 MG tablet, Take 5 mg by mouth at bedtime. , Disp: , Rfl:  .  mirabegron ER (MYRBETRIQ) 50 MG TB24 tablet, Take 50 mg by mouth daily., Disp: , Rfl:  .  montelukast (SINGULAIR) 10 MG tablet, Take 10 mg by mouth at bedtime., Disp: , Rfl:  .  Multiple Vitamins-Minerals (MULTIVITAMIN GUMMIES ADULTS) CHEW, Chew 2 tablets by mouth daily. , Disp: , Rfl:  .  nortriptyline (PAMELOR) 50 MG capsule, Take 50 mg by mouth at bedtime., Disp: , Rfl:  .  nystatin (MYCOSTATIN) 100000 UNIT/ML suspension, Take 5 mLs by mouth 4 (four) times daily as needed (yeast). , Disp: , Rfl:  .  nystatin cream (MYCOSTATIN), Apply 1 application topically 2 (two) times daily as needed (yeast)., Disp: , Rfl:  .  ondansetron (ZOFRAN) 4 MG tablet, Take 1 tablet (4 mg total) by mouth every 8 (eight) hours as needed for up to 10 doses for nausea or vomiting., Disp: 20 tablet, Rfl: 0 .  [START ON 09/11/2019] oxyCODONE-acetaminophen (PERCOCET) 10-325 MG tablet, Take 1 tablet by mouth every 4 (four) hours as needed for pain., Disp: 135 tablet, Rfl: 0 .  [START ON 10/11/2019] oxyCODONE-acetaminophen (PERCOCET) 10-325 MG tablet, Take 1 tablet by mouth every 4 (four) hours as needed for pain., Disp: 135 tablet, Rfl: 0 .  pantoprazole (PROTONIX) 40 MG tablet, Take 40 mg by mouth 2 (two) times daily., Disp: , Rfl:  .  pilocarpine (SALAGEN) 5 MG tablet, Take 5 mg by mouth 2 (two) times daily. , Disp: , Rfl:  .  Polyvinyl Alcohol-Povidone  (REFRESH OP), Place 1 drop into both eyes daily as needed (dry eyes)., Disp: , Rfl:  .  potassium chloride SA (K-DUR,KLOR-CON) 20 MEQ tablet, Take 20 mEq by mouth 3 days. , Disp: , Rfl:  .  predniSONE (DELTASONE) 20 MG tablet, Take 20 mg by mouth daily. 3 tabs daily at night, Disp: , Rfl:  .  Probiotic Product (PROBIOTIC COLON SUPPORT) CAPS, Take 1 capsule by mouth at bedtime., Disp: , Rfl:  .  sacubitril-valsartan (ENTRESTO) 49-51 MG, Take 1 tablet by mouth 2 (two) times daily., Disp: , Rfl:  .  sucralfate (CARAFATE) 1 G tablet, Take 1 g by mouth 2 (two) times daily before a meal. , Disp: ,  Rfl:  .  umeclidinium-vilanterol (ANORO ELLIPTA) 62.5-25 MCG/INH AEPB, Inhale 1 puff into the lungs daily., Disp: , Rfl:  .  Vitamin D, Ergocalciferol, (DRISDOL) 50000 UNITS CAPS capsule, Take 50,000 Units by mouth every Thursday. , Disp: , Rfl:  .  Xylitol (XYLIMELTS) 550 MG DISK, Use as directed 550 mg in the mouth or throat as needed. , Disp: , Rfl:   Assessment and Plan:  1. Unilateral primary osteoarthritis, right knee   2. DDD (degenerative disc disease), lumbar   3. Chronic bilateral low back pain with bilateral sciatica   4. Hip pain, bilateral   5. Chronic, continuous use of opioids   6. Chronic pain syndrome   7. Osteoarthritis of left knee, unspecified osteoarthritis type   8. Spinal stenosis of lumbar region with neurogenic claudication   9. Primary osteoarthritis of left knee   10. Chronic pain of right hip   Based on our discussion today and upon review of the [place] practitioner database information going to refill her medicines for the next 2 months dated for August 30 and September 29.  We talked about possible consideration of reducing to a 4 times daily dosing at her next visit in 2 months.  I have encouraged her to follow-up with her primary care physician for evaluation today secondary to some of the underlying symptoms she is described.  Furthermore she has not taken the Covid  vaccine and we have talked about this at length as well.  I have encouraged her to continue with efforts at weight loss and activity with stretching strengthening exercises and we will make no other changes to her pain clinic protocol at this point.  She is scheduled for return to clinic in 2 months. Follow Up Instructions:    I discussed the assessment and treatment plan with the patient. The patient was provided an opportunity to ask questions and all were answered. The patient agreed with the plan and demonstrated an understanding of the instructions.   The patient was advised to call back or seek an in-person evaluation if the symptoms worsen or if the condition fails to improve as anticipated.  I provided 30 minutes of non-face-to-face time during this encounter.   Molli Barrows, MD

## 2019-09-04 NOTE — Addendum Note (Signed)
Addended by: Carter Kitten D on: 09/04/2019 03:31 PM   Modules accepted: Orders

## 2019-09-04 NOTE — Telephone Encounter (Signed)
Pt was last seen on 07/2018 with Dr. Caryl Comes and he wanted to see her back. Please address this refill again. Thanks

## 2019-10-26 ENCOUNTER — Other Ambulatory Visit: Payer: Self-pay

## 2019-10-26 ENCOUNTER — Other Ambulatory Visit
Admission: RE | Admit: 2019-10-26 | Discharge: 2019-10-26 | Disposition: A | Discharge status: Home / Self Care | Payer: Medicare Other | Source: Ambulatory Visit | Attending: Gastroenterology | Admitting: Gastroenterology

## 2019-10-26 DIAGNOSIS — Z20822 Contact with and (suspected) exposure to covid-19: Secondary | ICD-10-CM | POA: Insufficient documentation

## 2019-10-26 DIAGNOSIS — Z01812 Encounter for preprocedural laboratory examination: Secondary | ICD-10-CM | POA: Diagnosis present

## 2019-10-26 LAB — SARS CORONAVIRUS 2 (TAT 6-24 HRS): SARS Coronavirus 2: NEGATIVE

## 2019-10-27 ENCOUNTER — Encounter: Payer: Self-pay | Admitting: Anesthesiology

## 2019-10-27 ENCOUNTER — Ambulatory Visit: Payer: Medicare Other | Attending: Anesthesiology | Admitting: Anesthesiology

## 2019-10-27 DIAGNOSIS — G894 Chronic pain syndrome: Secondary | ICD-10-CM

## 2019-10-27 DIAGNOSIS — M25551 Pain in right hip: Secondary | ICD-10-CM

## 2019-10-27 DIAGNOSIS — M1711 Unilateral primary osteoarthritis, right knee: Secondary | ICD-10-CM | POA: Diagnosis not present

## 2019-10-27 DIAGNOSIS — M25552 Pain in left hip: Secondary | ICD-10-CM

## 2019-10-27 DIAGNOSIS — M1712 Unilateral primary osteoarthritis, left knee: Secondary | ICD-10-CM

## 2019-10-27 DIAGNOSIS — M5442 Lumbago with sciatica, left side: Secondary | ICD-10-CM

## 2019-10-27 DIAGNOSIS — M5136 Other intervertebral disc degeneration, lumbar region: Secondary | ICD-10-CM | POA: Diagnosis not present

## 2019-10-27 DIAGNOSIS — F1190 Opioid use, unspecified, uncomplicated: Secondary | ICD-10-CM

## 2019-10-27 DIAGNOSIS — M48062 Spinal stenosis, lumbar region with neurogenic claudication: Secondary | ICD-10-CM

## 2019-10-27 DIAGNOSIS — M5441 Lumbago with sciatica, right side: Secondary | ICD-10-CM

## 2019-10-27 DIAGNOSIS — G8929 Other chronic pain: Secondary | ICD-10-CM

## 2019-10-27 MED ORDER — OXYCODONE-ACETAMINOPHEN 10-325 MG PO TABS: 1.0000 | ORAL_TABLET | ORAL | 0 refills | Status: DC | PRN

## 2019-10-27 NOTE — Progress Notes (Signed)
Virtual Visit via Telephone Note  I connected with Wendy Sanchez on 10/27/19 at  1:15 PM EDT by telephone and verified that I am speaking with the correct person using two identifiers.  Location: Patient: Home Provider: [facility] control center   I discussed the limitations, risks, security and privacy concerns of performing an evaluation and management service by telephone and the availability of in person appointments. I also discussed with the patient that there may be a patient responsible charge related to this service. The patient expressed understanding and agreed to proceed.   History of Present Illness: I spoke with Wendy Sanchez today via telephone as she is not able to do the video portion of the virtual conference and she reports that she is having a lot more pain recently secondary to the chronic prednisone she been taking.  This is caused a fair amount of weight water retention and swelling affecting the lower extremities.  Is currently under evaluation by her primary care physicians as it is her primary treatment for her temporal arteritis.  Is currently being weaned and her creatinines being followed.  The medications that she is taking as far as her Percocet are working well for her and keeping her other generalized pain under good control.  She is tolerating well with no side effects reported.  The quality characteristic and distribution of her low back pain knee pain hip pain and diffuse other body pain has been stable in nature.  She continues to derive good functional improvement with them and is using an additional tablet approximately every other day to help with her breakthrough pain.    Observations/Objective:  Current Outpatient Medications:  .  albuterol (PROVENTIL HFA;VENTOLIN HFA) 108 (90 Base) MCG/ACT inhaler, Inhale 2 puffs into the lungs every 6 (six) hours as needed for wheezing or shortness of breath., Disp: , Rfl:  .  apixaban (ELIQUIS) 5 MG TABS tablet, Take 1  tablet (5 mg total) by mouth 2 (two) times daily., Disp: 60 tablet, Rfl: 0 .  Ascorbic Acid (VITAMIN C PO), Take 2 tablets by mouth daily., Disp: , Rfl:  .  aspirin EC 81 MG tablet, Take 1 tablet (81 mg total) daily by mouth., Disp: 90 tablet, Rfl: 3 .  azelastine (ASTELIN) 0.1 % nasal spray, Place 1 spray into both nostrils 2 (two) times daily as needed for allergies. Use in each nostril as directed , Disp: , Rfl:  .  carvedilol (COREG) 6.25 MG tablet, Take 1 tablet (6.25 mg total) by mouth 2 (two) times daily., Disp: 180 tablet, Rfl: 3 .  cyclobenzaprine (FLEXERIL) 10 MG tablet, Take 1 tablet (10 mg total) by mouth 3 (three) times daily as needed. for muscle spams, Disp: 270 tablet, Rfl: 1 .  Dextromethorphan-guaiFENesin (CORICIDIN HBP CONGESTION/COUGH) 10-200 MG CAPS, Take 2 tablets by mouth 2 (two) times daily as needed (cough / congestion)., Disp: , Rfl:  .  diclofenac sodium (VOLTAREN) 1 % GEL, Apply 2 g topically 4 (four) times daily as needed (pain). , Disp: , Rfl:  .  diphenhydrAMINE (BENADRYL) 25 mg capsule, Take 50 mg by mouth every 6 (six) hours as needed for allergies. , Disp: , Rfl:  .  docusate sodium (COLACE) 100 MG capsule, Take 100 mg by mouth at bedtime as needed for mild constipation. , Disp: , Rfl:  .  ELDERBERRY PO, Take 100 mg by mouth daily. , Disp: , Rfl:  .  fluconazole (DIFLUCAN) 100 MG tablet, Take 100 mg by mouth daily as needed (  yeast infections)., Disp: , Rfl:  .  folic acid (FOLVITE) 1 MG tablet, Take 1 mg by mouth at bedtime. , Disp: , Rfl:  .  furosemide (LASIX) 40 MG tablet, Take 2 tablets (80 mg) by mouth once daily as directed (Patient taking differently: Take 80 mg by mouth daily. ), Disp: 180 tablet, Rfl: 3 .  gabapentin (NEURONTIN) 300 MG capsule, Take 2 capsules (600 mg total) by mouth See admin instructions. Take 600 mg by mouth twice daily, may take a third 600 mg dose as needed for pain, Disp: 540 capsule, Rfl: 0 .  gentamicin ointment (GARAMYCIN) 0.1 %,  Apply 1 application topically 5 (five) times daily as needed (boil). , Disp: , Rfl:  .  Guaifenesin (MUCINEX MAXIMUM STRENGTH) 1200 MG TB12, Take 1,200 mg by mouth 2 (two) times daily as needed (congestion)., Disp: , Rfl:  .  ipratropium-albuterol (DUONEB) 0.5-2.5 (3) MG/3ML SOLN, Take 3 mLs by nebulization every 6 (six) hours as needed (shortness of breath)., Disp: , Rfl:  .  levocetirizine (XYZAL) 5 MG tablet, Take 5 mg by mouth at bedtime. , Disp: , Rfl:  .  mirabegron ER (MYRBETRIQ) 50 MG TB24 tablet, Take 50 mg by mouth daily., Disp: , Rfl:  .  montelukast (SINGULAIR) 10 MG tablet, Take 10 mg by mouth at bedtime., Disp: , Rfl:  .  Multiple Vitamins-Minerals (MULTIVITAMIN GUMMIES ADULTS) CHEW, Chew 2 tablets by mouth daily. , Disp: , Rfl:  .  nortriptyline (PAMELOR) 50 MG capsule, Take 50 mg by mouth at bedtime., Disp: , Rfl:  .  nystatin (MYCOSTATIN) 100000 UNIT/ML suspension, Take 5 mLs by mouth 4 (four) times daily as needed (yeast). , Disp: , Rfl:  .  nystatin cream (MYCOSTATIN), Apply 1 application topically 2 (two) times daily as needed (yeast)., Disp: , Rfl:  .  ondansetron (ZOFRAN) 4 MG tablet, Take 1 tablet (4 mg total) by mouth every 8 (eight) hours as needed for up to 10 doses for nausea or vomiting., Disp: 20 tablet, Rfl: 0 .  [START ON 11/10/2019] oxyCODONE-acetaminophen (PERCOCET) 10-325 MG tablet, Take 1 tablet by mouth every 4 (four) hours as needed for pain., Disp: 135 tablet, Rfl: 0 .  [START ON 12/10/2019] oxyCODONE-acetaminophen (PERCOCET) 10-325 MG tablet, Take 1 tablet by mouth every 4 (four) hours as needed for pain., Disp: 135 tablet, Rfl: 0 .  pantoprazole (PROTONIX) 40 MG tablet, Take 40 mg by mouth 2 (two) times daily., Disp: , Rfl:  .  pilocarpine (SALAGEN) 5 MG tablet, Take 5 mg by mouth 2 (two) times daily. , Disp: , Rfl:  .  Polyvinyl Alcohol-Povidone (REFRESH OP), Place 1 drop into both eyes daily as needed (dry eyes)., Disp: , Rfl:  .  potassium chloride SA  (K-DUR,KLOR-CON) 20 MEQ tablet, Take 20 mEq by mouth 3 days. , Disp: , Rfl:  .  predniSONE (DELTASONE) 20 MG tablet, Take 20 mg by mouth daily. 3 tabs daily at night, Disp: , Rfl:  .  Probiotic Product (PROBIOTIC COLON SUPPORT) CAPS, Take 1 capsule by mouth at bedtime., Disp: , Rfl:  .  sacubitril-valsartan (ENTRESTO) 49-51 MG, Take 1 tablet by mouth 2 (two) times daily., Disp: , Rfl:  .  sucralfate (CARAFATE) 1 G tablet, Take 1 g by mouth 2 (two) times daily before a meal. , Disp: , Rfl:  .  umeclidinium-vilanterol (ANORO ELLIPTA) 62.5-25 MCG/INH AEPB, Inhale 1 puff into the lungs daily., Disp: , Rfl:  .  Vitamin D, Ergocalciferol, (DRISDOL) 50000 UNITS CAPS capsule,  Take 50,000 Units by mouth every Thursday. , Disp: , Rfl:  .  Xylitol (XYLIMELTS) 550 MG DISK, Use as directed 550 mg in the mouth or throat as needed. , Disp: , Rfl:   Assessment and Plan: 1. Unilateral primary osteoarthritis, right knee   2. DDD (degenerative disc disease), lumbar   3. Chronic bilateral low back pain with bilateral sciatica   4. Hip pain, bilateral   5. Chronic, continuous use of opioids   6. Chronic pain syndrome   7. Osteoarthritis of left knee, unspecified osteoarthritis type   8. Spinal stenosis of lumbar region with neurogenic claudication   Based on our discussion today and upon review of the [facility] practitioner database information I am going to refill her medicines for the next 2 months.  No changes will be made in regards to her pain management regimen.  I encouraged her to stay active and continue follow-up with her primary care physicians.  We reviewed her creatinine today and hopefully they will be able to wean her steroids expeditiously.  In the meantime I am refilling her medications for October 29 and November 28 with return to clinic in 2 months.  Follow Up Instructions:    I discussed the assessment and treatment plan with the patient. The patient was provided an opportunity to ask  questions and all were answered. The patient agreed with the plan and demonstrated an understanding of the instructions.   The patient was advised to call back or seek an in-person evaluation if the symptoms worsen or if the condition fails to improve as anticipated.  I provided 30 minutes of non-face-to-face time during this encounter.   Molli Barrows, MD

## 2019-10-30 ENCOUNTER — Encounter: Payer: Self-pay | Admitting: *Deleted

## 2019-10-30 ENCOUNTER — Encounter
Admission: RE | Disposition: A | Discharge status: Home / Self Care | Payer: Self-pay | Source: Home / Self Care | Attending: Gastroenterology

## 2019-10-30 ENCOUNTER — Ambulatory Visit: Payer: Medicare Other | Admitting: Registered Nurse

## 2019-10-30 ENCOUNTER — Ambulatory Visit
Admission: RE | Admit: 2019-10-30 | Discharge: 2019-10-30 | Disposition: A | Discharge status: Home / Self Care | Payer: Medicare Other | Attending: Gastroenterology | Admitting: Gastroenterology

## 2019-10-30 ENCOUNTER — Other Ambulatory Visit: Payer: Self-pay

## 2019-10-30 DIAGNOSIS — R1310 Dysphagia, unspecified: Secondary | ICD-10-CM | POA: Insufficient documentation

## 2019-10-30 DIAGNOSIS — K640 First degree hemorrhoids: Secondary | ICD-10-CM | POA: Insufficient documentation

## 2019-10-30 DIAGNOSIS — Z7901 Long term (current) use of anticoagulants: Secondary | ICD-10-CM | POA: Diagnosis not present

## 2019-10-30 DIAGNOSIS — K219 Gastro-esophageal reflux disease without esophagitis: Secondary | ICD-10-CM | POA: Diagnosis not present

## 2019-10-30 DIAGNOSIS — J449 Chronic obstructive pulmonary disease, unspecified: Secondary | ICD-10-CM | POA: Diagnosis not present

## 2019-10-30 DIAGNOSIS — Z1211 Encounter for screening for malignant neoplasm of colon: Secondary | ICD-10-CM | POA: Diagnosis not present

## 2019-10-30 DIAGNOSIS — Z885 Allergy status to narcotic agent status: Secondary | ICD-10-CM | POA: Insufficient documentation

## 2019-10-30 DIAGNOSIS — K635 Polyp of colon: Secondary | ICD-10-CM | POA: Diagnosis not present

## 2019-10-30 DIAGNOSIS — Z86711 Personal history of pulmonary embolism: Secondary | ICD-10-CM | POA: Insufficient documentation

## 2019-10-30 DIAGNOSIS — D122 Benign neoplasm of ascending colon: Secondary | ICD-10-CM | POA: Diagnosis not present

## 2019-10-30 DIAGNOSIS — N289 Disorder of kidney and ureter, unspecified: Secondary | ICD-10-CM | POA: Insufficient documentation

## 2019-10-30 DIAGNOSIS — Z87892 Personal history of anaphylaxis: Secondary | ICD-10-CM | POA: Diagnosis not present

## 2019-10-30 DIAGNOSIS — K449 Diaphragmatic hernia without obstruction or gangrene: Secondary | ICD-10-CM | POA: Diagnosis not present

## 2019-10-30 DIAGNOSIS — Z6841 Body Mass Index (BMI) 40.0 and over, adult: Secondary | ICD-10-CM | POA: Diagnosis not present

## 2019-10-30 DIAGNOSIS — Z96653 Presence of artificial knee joint, bilateral: Secondary | ICD-10-CM | POA: Insufficient documentation

## 2019-10-30 DIAGNOSIS — Z7952 Long term (current) use of systemic steroids: Secondary | ICD-10-CM | POA: Diagnosis not present

## 2019-10-30 DIAGNOSIS — Z79899 Other long term (current) drug therapy: Secondary | ICD-10-CM | POA: Diagnosis not present

## 2019-10-30 DIAGNOSIS — Z86718 Personal history of other venous thrombosis and embolism: Secondary | ICD-10-CM | POA: Insufficient documentation

## 2019-10-30 DIAGNOSIS — I10 Essential (primary) hypertension: Secondary | ICD-10-CM | POA: Insufficient documentation

## 2019-10-30 DIAGNOSIS — K5730 Diverticulosis of large intestine without perforation or abscess without bleeding: Secondary | ICD-10-CM | POA: Insufficient documentation

## 2019-10-30 DIAGNOSIS — G4730 Sleep apnea, unspecified: Secondary | ICD-10-CM | POA: Diagnosis not present

## 2019-10-30 DIAGNOSIS — D124 Benign neoplasm of descending colon: Secondary | ICD-10-CM | POA: Diagnosis not present

## 2019-10-30 DIAGNOSIS — M797 Fibromyalgia: Secondary | ICD-10-CM | POA: Diagnosis not present

## 2019-10-30 DIAGNOSIS — Z9884 Bariatric surgery status: Secondary | ICD-10-CM | POA: Insufficient documentation

## 2019-10-30 DIAGNOSIS — Z86010 Personal history of colonic polyps: Secondary | ICD-10-CM | POA: Diagnosis present

## 2019-10-30 DIAGNOSIS — Z888 Allergy status to other drugs, medicaments and biological substances status: Secondary | ICD-10-CM | POA: Diagnosis not present

## 2019-10-30 HISTORY — PX: ESOPHAGOGASTRODUODENOSCOPY: SHX5428

## 2019-10-30 HISTORY — PX: COLONOSCOPY: SHX5424

## 2019-10-30 SURGERY — COLONOSCOPY
Anesthesia: General

## 2019-10-30 MED ORDER — PHENYLEPHRINE HCL (PRESSORS) 10 MG/ML IV SOLN
INTRAVENOUS | Status: DC | PRN
  Administered 2019-10-30: 100 ug via INTRAVENOUS

## 2019-10-30 MED ORDER — PROPOFOL 10 MG/ML IV BOLUS
INTRAVENOUS | Status: DC | PRN
  Administered 2019-10-30: 80 mg via INTRAVENOUS

## 2019-10-30 MED ORDER — SODIUM CHLORIDE 0.9 % IV SOLN: INTRAVENOUS | Status: DC

## 2019-10-30 MED ORDER — LIDOCAINE HCL (CARDIAC) PF 100 MG/5ML IV SOSY
PREFILLED_SYRINGE | INTRAVENOUS | Status: DC | PRN
  Administered 2019-10-30: 100 mg via INTRAVENOUS

## 2019-10-30 MED ORDER — PROPOFOL 500 MG/50ML IV EMUL
INTRAVENOUS | Status: DC | PRN
  Administered 2019-10-30: 140 ug/kg/min via INTRAVENOUS

## 2019-10-30 NOTE — H&P (Signed)
Outpatient short stay form Pre-procedure 10/30/2019 10:31 AM Wendy Miyamoto MD, MPH  Primary Physician: Dr. Kary Kos  Reason for visit:  Dysphagia/Surveillance  History of present illness:   Wendy Sanchez is a 69 y/o lady with history of lap band surgery in the 80's here for EGD for chronic dysphagia she attributes to chronic dry mouth and then surveillance colonoscopy for history of a SSA. Takes NOAC with last dose this past Thursday. No family history of GI malignancies. History of partial hysterectomy.   No current facility-administered medications for this encounter.  Medications Prior to Admission  Medication Sig Dispense Refill Last Dose  . albuterol (PROVENTIL HFA;VENTOLIN HFA) 108 (90 Base) MCG/ACT inhaler Inhale 2 puffs into the lungs every 6 (six) hours as needed for wheezing or shortness of breath.   10/30/2019 at 0630  . Ascorbic Acid (VITAMIN C PO) Take 2 tablets by mouth daily.   Past Month at Unknown time  . carvedilol (COREG) 6.25 MG tablet Take 1 tablet (6.25 mg total) by mouth 2 (two) times daily. 180 tablet 3 10/30/2019 at 0630  . cyclobenzaprine (FLEXERIL) 10 MG tablet Take 1 tablet (10 mg total) by mouth 3 (three) times daily as needed. for muscle spams 270 tablet 1 Past Month at Unknown time  . diclofenac sodium (VOLTAREN) 1 % GEL Apply 2 g topically 4 (four) times daily as needed (pain).    Past Month at Unknown time  . diphenhydrAMINE (BENADRYL) 25 mg capsule Take 50 mg by mouth every 6 (six) hours as needed for allergies.    Past Week at Unknown time  . docusate sodium (COLACE) 100 MG capsule Take 100 mg by mouth at bedtime as needed for mild constipation.    Past Month at Unknown time  . ELDERBERRY PO Take 100 mg by mouth daily.    Past Month at Unknown time  . folic acid (FOLVITE) 1 MG tablet Take 1 mg by mouth at bedtime.    Past Week at Unknown time  . furosemide (LASIX) 40 MG tablet Take 2 tablets (80 mg) by mouth once daily as directed (Patient taking differently:  Take 80 mg by mouth daily. ) 180 tablet 3 10/29/2019 at Unknown time  . levocetirizine (XYZAL) 5 MG tablet Take 5 mg by mouth at bedtime.    10/29/2019 at Unknown time  . montelukast (SINGULAIR) 10 MG tablet Take 10 mg by mouth at bedtime.   10/29/2019 at Unknown time  . Multiple Vitamins-Minerals (MULTIVITAMIN GUMMIES ADULTS) CHEW Chew 2 tablets by mouth daily.    Past Week at Unknown time  . pantoprazole (PROTONIX) 40 MG tablet Take 40 mg by mouth 2 (two) times daily.   10/29/2019 at Unknown time  . predniSONE (DELTASONE) 20 MG tablet Take 20 mg by mouth daily. 3 tabs daily at night   10/29/2019 at Unknown time  . Probiotic Product (PROBIOTIC COLON SUPPORT) CAPS Take 1 capsule by mouth at bedtime.   10/29/2019 at Unknown time  . sacubitril-valsartan (ENTRESTO) 49-51 MG Take 1 tablet by mouth 2 (two) times daily.   10/30/2019 at 0630  . umeclidinium-vilanterol (ANORO ELLIPTA) 62.5-25 MCG/INH AEPB Inhale 1 puff into the lungs daily.   Past Month at Unknown time  . Vitamin D, Ergocalciferol, (DRISDOL) 50000 UNITS CAPS capsule Take 50,000 Units by mouth every Thursday.    Past Week at Unknown time  . apixaban (ELIQUIS) 5 MG TABS tablet Take 1 tablet (5 mg total) by mouth 2 (two) times daily. 60 tablet 0   .  aspirin EC 81 MG tablet Take 1 tablet (81 mg total) daily by mouth. (Patient not taking: Reported on 10/30/2019) 90 tablet 3 Not Taking at Unknown time  . azelastine (ASTELIN) 0.1 % nasal spray Place 1 spray into both nostrils 2 (two) times daily as needed for allergies. Use in each nostril as directed      . Dextromethorphan-guaiFENesin (CORICIDIN HBP CONGESTION/COUGH) 10-200 MG CAPS Take 2 tablets by mouth 2 (two) times daily as needed (cough / congestion).     . fluconazole (DIFLUCAN) 100 MG tablet Take 100 mg by mouth daily as needed (yeast infections).     . gabapentin (NEURONTIN) 300 MG capsule Take 2 capsules (600 mg total) by mouth See admin instructions. Take 600 mg by mouth twice daily, may  take a third 600 mg dose as needed for pain 540 capsule 0   . gentamicin ointment (GARAMYCIN) 0.1 % Apply 1 application topically 5 (five) times daily as needed (boil).      . Guaifenesin (MUCINEX MAXIMUM STRENGTH) 1200 MG TB12 Take 1,200 mg by mouth 2 (two) times daily as needed (congestion).     Marland Kitchen ipratropium-albuterol (DUONEB) 0.5-2.5 (3) MG/3ML SOLN Take 3 mLs by nebulization every 6 (six) hours as needed (shortness of breath).    at 10/2018  . mirabegron ER (MYRBETRIQ) 50 MG TB24 tablet Take 50 mg by mouth daily. (Patient not taking: Reported on 10/30/2019)   Not Taking at Unknown time  . nortriptyline (PAMELOR) 50 MG capsule Take 50 mg by mouth at bedtime.     Marland Kitchen nystatin (MYCOSTATIN) 100000 UNIT/ML suspension Take 5 mLs by mouth 4 (four) times daily as needed (yeast).      . nystatin cream (MYCOSTATIN) Apply 1 application topically 2 (two) times daily as needed (yeast).     . ondansetron (ZOFRAN) 4 MG tablet Take 1 tablet (4 mg total) by mouth every 8 (eight) hours as needed for up to 10 doses for nausea or vomiting. 20 tablet 0   . [START ON 11/10/2019] oxyCODONE-acetaminophen (PERCOCET) 10-325 MG tablet Take 1 tablet by mouth every 4 (four) hours as needed for pain. 135 tablet 0   . [START ON 12/10/2019] oxyCODONE-acetaminophen (PERCOCET) 10-325 MG tablet Take 1 tablet by mouth every 4 (four) hours as needed for pain. 135 tablet 0   . pilocarpine (SALAGEN) 5 MG tablet Take 5 mg by mouth 2 (two) times daily.      . Polyvinyl Alcohol-Povidone (REFRESH OP) Place 1 drop into both eyes daily as needed (dry eyes).     . potassium chloride SA (K-DUR,KLOR-CON) 20 MEQ tablet Take 20 mEq by mouth 3 days.      . sucralfate (CARAFATE) 1 G tablet Take 1 g by mouth 2 (two) times daily before a meal.    10/28/19  . Xylitol (XYLIMELTS) 550 MG DISK Use as directed 550 mg in the mouth or throat as needed.         Allergies  Allergen Reactions  . Hydrocodone-Acetaminophen Swelling    hands  . Iodine  Anaphylaxis and Swelling    Iv dye  . Other Other (See Comments)    ALLERGY TO METAL - BLACKENS SKIN AND CAUSES A RASH   . Tape Swelling and Other (See Comments)    Skin comes off.  Paper tape is ok tegaderm OK  . Peanut Oil Other (See Comments)    ** Nuts cause runny nose  . Shellfish Allergy Nausea And Vomiting and Swelling    Episode of GI  infection after eating clam chowder. Still eats shrimp and other seafood     Past Medical History:  Diagnosis Date  . Allergic rhinitis   . Arthritis   . Asthma    problems with fumes and aerosols cause asthma  . Chest pain 10/19/2016  . Complication of anesthesia    awakens during surgery; has occurred with last 3-4 surgeries   . COPD (chronic obstructive pulmonary disease) ([place])   . DVT (deep venous thrombosis) ([place]) 07/13/2018   Right leg  . Environmental allergies    fumes   . Fibromyalgia   . GERD (gastroesophageal reflux disease)   . Headache   . Hemorrhoids   . History of bronchitis   . Hx of total knee arthroplasty 12/13/2015  . Hypertension   . Morbid obesity with BMI of 45.0-49.9, adult ([place]) 07/25/2015  . NICM (nonischemic cardiomyopathy) ([place])    a. ? PVC mediated;  b. 06/2016 Echo: EF 25-30%, mild LVH, mild LAE, mild AI/MR/TR/PR; c. 08/2016 Cath: nl cors, EF 25%.  . Numbness    hands bilat when driving; improves when not preforming task   . Osteoarthritis of left knee 08/02/2015  . PE (pulmonary thromboembolism) ([place]) 07/13/2018  . Pes anserinus bursitis of left knee 05/18/2016  . Presence of right artificial knee joint 05/18/2016  . PVC (premature ventricular contraction) 06/04/2017  . PVC's (premature ventricular contractions)    a. 11/2016 Amio started;  b. 11/29/16 48h Holter: 36629 PVC's (41%); c. 12/2016 24h Holter: 18040 PVC's (15%); c. 02/2017 48h Holter: 37262 PVC's (41%).  . Sleep apnea    cannot tolerate CPAP  . Spinal stenosis of lumbar region 06/19/2015  . Status post gastric banding   . Status post total left knee  replacement 08/02/2015  . Tinnitus    comes and goes   . Unilateral primary osteoarthritis, right knee 12/13/2015  . Vertigo    none for over 2 yrs    Review of systems:  Otherwise negative.    Physical Exam  Gen: Alert, oriented. Appears stated age.  HEENT: [place]/AT. PERRLA. Lungs: No respiratory distress CV: RRR Abd: soft, benign, no masses.  Ext: No edema.     Planned procedures: Proceed with EGD/colonoscopy. The patient understands the nature of the planned procedure, indications, risks, alternatives and potential complications including but not limited to bleeding, infection, perforation, damage to internal organs and possible oversedation/side effects from anesthesia. The patient agrees and gives consent to proceed.  Please refer to procedure notes for findings, recommendations and patient disposition/instructions.     Wendy Miyamoto MD, MPH Gastroenterology 10/30/2019  10:31 AM

## 2019-10-30 NOTE — Anesthesia Postprocedure Evaluation (Signed)
Anesthesia Post Note  Patient: Wendy Sanchez  Procedure(s) Performed: COLONOSCOPY (N/A ) ESOPHAGOGASTRODUODENOSCOPY (EGD) (N/A )  Patient location during evaluation: Endoscopy Anesthesia Type: General Level of consciousness: awake and alert and oriented Pain management: pain level controlled Vital Signs Assessment: post-procedure vital signs reviewed and stable Respiratory status: spontaneous breathing, nonlabored ventilation and respiratory function stable Cardiovascular status: blood pressure returned to baseline and stable Postop Assessment: no signs of nausea or vomiting Anesthetic complications: no   No complications documented.   Last Vitals:  Vitals:   10/30/19 1159 10/30/19 1209  BP: 98/67 108/71  Pulse: 73 71  Resp: 17 15  Temp:    SpO2: 100% 100%    Last Pain:  Vitals:   10/30/19 1139  TempSrc: Temporal  PainSc:                  Karanveer Ramakrishnan

## 2019-10-30 NOTE — Anesthesia Preprocedure Evaluation (Signed)
Anesthesia Evaluation  Patient identified by MRN, date of birth, ID band Patient awake    Reviewed: Allergy & Precautions, H&P , NPO status , Patient's Chart, lab work & pertinent test results, reviewed documented beta blocker date and time   History of Anesthesia Complications (+) history of anesthetic complications  Airway Mallampati: II   Neck ROM: full    Dental   Pulmonary neg pulmonary ROS, asthma , sleep apnea and Continuous Positive Airway Pressure Ventilation , COPD,  COPD inhaler, Patient abstained from smoking., former smoker,    Pulmonary exam normal        Cardiovascular Exercise Tolerance: Poor hypertension, On Medications negative cardio ROS Normal cardiovascular exam Rhythm:regular Rate:Normal     Neuro/Psych  Headaches,  Neuromuscular disease negative neurological ROS  negative psych ROS   GI/Hepatic negative GI ROS, Neg liver ROS, GERD  Medicated,  Endo/Other  negative endocrine ROSMorbid obesity  Renal/GU Renal diseasenegative Renal ROS  negative genitourinary   Musculoskeletal   Abdominal   Peds  Hematology negative hematology ROS (+)   Anesthesia Other Findings Past Medical History: No date: Allergic rhinitis No date: Arthritis No date: Asthma     Comment:  problems with fumes and aerosols cause asthma 10/19/2016: Chest pain No date: Complication of anesthesia     Comment:  awakens during surgery; has occurred with last 3-4               surgeries  No date: COPD (chronic obstructive pulmonary disease) ([place]) 07/13/2018: DVT (deep venous thrombosis) ([facility])     Comment:  Right leg No date: Environmental allergies     Comment:  fumes  No date: Fibromyalgia No date: GERD (gastroesophageal reflux disease) No date: Headache No date: Hemorrhoids No date: History of bronchitis 12/13/2015: Hx of total knee arthroplasty No date: Hypertension 07/25/2015: Morbid obesity with BMI of 45.0-49.9, adult  ([place]) No date: NICM (nonischemic cardiomyopathy) ([place])     Comment:  a. ? PVC mediated;  b. 06/2016 Echo: EF 25-30%, mild LVH,              mild LAE, mild AI/MR/TR/PR; c. 08/2016 Cath: nl cors, EF               25%. No date: Numbness     Comment:  hands bilat when driving; improves when not preforming               task  08/02/2015: Osteoarthritis of left knee 07/13/2018: PE (pulmonary thromboembolism) ([place]) 05/18/2016: Pes anserinus bursitis of left knee 05/18/2016: Presence of right artificial knee joint 06/04/2017: PVC (premature ventricular contraction) No date: PVC's (premature ventricular contractions)     Comment:  a. 11/2016 Amio started;  b. 11/29/16 48h Holter: 57574               PVC's (41%); c. 12/2016 24h Holter: 18040 PVC's (15%); c.              02/2017 48h Holter: 37262 PVC's (41%). No date: Sleep apnea     Comment:  cannot tolerate CPAP 06/19/2015: Spinal stenosis of lumbar region No date: Status post gastric banding 08/02/2015: Status post total left knee replacement No date: Tinnitus     Comment:  comes and goes  12/13/2015: Unilateral primary osteoarthritis, right knee No date: Vertigo     Comment:  none for over 2 yrs Past Surgical History: 1979: ABDOMINAL HYSTERECTOMY     Comment:  left ovary remains No date: ABDOMINAL HYSTERECTOMY 06/07/2019: ARTERY BIOPSY; Left  Comment:  Procedure: BIOPSY TEMPORAL ARTERY;  Surgeon: Algernon Huxley, MD;  Location: [facility];  Service: Vascular;                Laterality: Left; No date: CHOLECYSTECTOMY No date: COLONOSCOPY No date: fibrous tissue removed from right shoulder and back of neck      Comment:  2 years ago  02/28/2019: [place]; N/A     Comment:  Procedure: SALIVARY GLAND BIOPSY;  Surgeon: Carloyn Manner, MD;  Location: [facility];  Service: ENT;                Laterality: N/A; No date: FRACTURE SURGERY     Comment:  left small finger No date: GANGLION CYST EXCISION 1980:  GASTRIC BYPASS     Comment:  states had bypass with banding and band left in - No date: GASTRIC BYPASS OPEN 2017: JOINT REPLACEMENT; Bilateral     Comment:  knees No date: KNEE SURGERY; Bilateral     Comment:  both knees  2001 1205/19: NECK SURGERY; N/A     Comment:  ganglion cyst removal, [place]  07/14/2018: PULMONARY THROMBECTOMY; Bilateral     Comment:  Procedure: PULMONARY THROMBECTOMY;  Surgeon: Algernon Huxley, MD;  Location: [facility];  Service:               Cardiovascular;  Laterality: Bilateral; 06/04/2017: PVC ABLATION; N/A     Comment:  Procedure: PVC ABLATION;  Surgeon: Evans Lance, MD;               Location: [place];  Service: Cardiovascular;                Laterality: N/A; 08/12/2016: RIGHT/LEFT HEART CATH AND CORONARY ANGIOGRAPHY; Bilateral     Comment:  Procedure: Right/Left Heart Cath and Coronary               Angiography;  Surgeon: Yolonda Kida, MD;  Location:              [facility];  Service: Cardiovascular;                Laterality: Bilateral; No date: TONSILLECTOMY     Comment:  age 72 08/02/2015: TOTAL KNEE ARTHROPLASTY; Left     Comment:  Procedure: LEFT TOTAL KNEE ARTHROPLASTY;  Surgeon:               Mcarthur Rossetti, MD;  Location: [facility];  Service:               Orthopedics;  Laterality: Left; 12/13/2015: TOTAL KNEE ARTHROPLASTY; Right     Comment:  Procedure: RIGHT TOTAL KNEE ARTHROPLASTY;  Surgeon:               Mcarthur Rossetti, MD;  Location: [facility];  Service:               Orthopedics;  Laterality: Right; BMI    Body Mass Index: 46.52 kg/m     Reproductive/Obstetrics negative OB ROS                             Anesthesia Physical Anesthesia Plan  ASA: III  Anesthesia Plan: General   Post-op Pain Management:    Induction:   PONV Risk Score and Plan:   Airway Management Planned:   Additional Equipment:   Intra-op Plan:   Post-operative Plan:    Informed Consent: I have reviewed the patients History and Physical, chart, labs and discussed the procedure including the risks, benefits and alternatives for the proposed anesthesia with the patient or authorized representative who has indicated his/her understanding and acceptance.     Dental Advisory Given  Plan Discussed with: CRNA  Anesthesia Plan Comments:         Anesthesia Quick Evaluation

## 2019-10-30 NOTE — Op Note (Signed)
[facility] Gastroenter[facility] Patient Name: Wendy Sanchez Procedure Date: 10/30/2019 10:44 AM MRN: [id number] Account #: [id number] Date of Birth: September 04, 1950 Admit Type: Outpatient Age: 69 Room: [facility] ENDO ROOM 4 Gender: Female Note Status: Finalized Procedure:             Colonoscopy Indications:           High risk colon cancer surveillance: Personal history                         of colonic polyps Providers:             Andrey Farmer MD, MD Referring MD:          Irven Easterly. Kary Kos, MD (Referring MD) Medicines:             Monitored Anesthesia Care Complications:         No immediate complications. Estimated blood loss:                         Minimal. Procedure:             Pre-Anesthesia Assessment:                        - Prior to the procedure, a History and Physical was                         performed, and patient medications and allergies were                         reviewed. The patient is competent. The risks and                         benefits of the procedure and the sedation options and                         risks were discussed with the patient. All questions                         were answered and informed consent was obtained.                         Patient identification and proposed procedure were                         verified by the physician, the nurse, the anesthetist                         and the technician in the endoscopy suite. Mental                         Status Examination: alert and oriented. Airway                         Examination: normal oropharyngeal airway and neck                         mobility. Respiratory Examination: clear to  auscultation. CV Examination: normal. Prophylactic                         Antibiotics: The patient does not require prophylactic                         antibiotics. Prior Anticoagulants: The patient has                         taken Eliquis (apixaban), last  dose was 3 days prior                         to procedure. ASA Grade Assessment: III - A patient                         with severe systemic disease. After reviewing the                         risks and benefits, the patient was deemed in                         satisfactory condition to undergo the procedure. The                         anesthesia plan was to use monitored anesthesia care                         (MAC). Immediately prior to administration of                         medications, the patient was re-assessed for adequacy                         to receive sedatives. The heart rate, respiratory                         rate, oxygen saturations, blood pressure, adequacy of                         pulmonary ventilation, and response to care were                         monitored throughout the procedure. The physical                         status of the patient was re-assessed after the                         procedure.                        After obtaining informed consent, the colonoscope was                         passed under direct vision. Throughout the procedure,                         the patient's blood pressure, pulse, and oxygen  saturations were monitored continuously. The                         Colonoscope was introduced through the anus and                         advanced to the the cecum, identified by appendiceal                         orifice and ileocecal valve. The colonoscopy was                         performed without difficulty. The patient tolerated                         the procedure well. The quality of the bowel                         preparation was good except the cecum was fair. Findings:      The perianal and digital rectal examinations were normal.      A 3 mm polyp was found in the ascending colon. The polyp was sessile.       The polyp was removed with a cold snare. Resection and retrieval were        complete. Estimated blood loss was minimal.      Two sessile polyps were found in the descending colon. The polyps were 2       to 3 mm in size. These polyps were removed with a cold snare. Resection       and retrieval were complete. Estimated blood loss was minimal.      A 3 mm polyp was found in the sigmoid colon. The polyp was sessile. The       polyp was removed with a cold snare. Resection and retrieval were       complete. Estimated blood loss was minimal.      Many small and large-mouthed diverticula were found in the sigmoid       colon, descending colon, transverse colon and ascending colon.      Non-bleeding internal hemorrhoids were found during retroflexion. The       hemorrhoids were Grade I (internal hemorrhoids that do not prolapse).      The exam was otherwise without abnormality on direct and retroflexion       views. Impression:            - One 3 mm polyp in the ascending colon, removed with                         a cold snare. Resected and retrieved.                        - Two 2 to 3 mm polyps in the descending colon,                         removed with a cold snare. Resected and retrieved.                        - One 3 mm polyp in the sigmoid colon, removed with a  cold snare. Resected and retrieved.                        - Diverticulosis in the sigmoid colon, in the                         descending colon, in the transverse colon and in the                         ascending colon. The diverticulosis was predominantly                         in sigmoid colon.                        - Non-bleeding internal hemorrhoids.                        - The examination was otherwise normal on direct and                         retroflexion views. Recommendation:        - Discharge patient to home.                        - Resume previous diet.                        - Continue present medications.                        - Resume Eliquis (apixaban)  at prior dose today.                        - Await pathology results.                        - Repeat colonoscopy in 1 year because the bowel                         preparation was suboptimal and for surveillance.                        - Return to referring physician as previously                         scheduled. Procedure Code(s):     --- Professional ---                        [id number], Colonoscopy, flexible; with removal of                         tumor(s), polyp(s), or other lesion(s) by snare                         technique Diagnosis Code(s):     --- Professional ---                        K63.5, Polyp of colon  Z86.010, Personal history of colonic polyps                        K64.0, First degree hemorrhoids                        K57.30, Diverticulosis of large intestine without                         perforation or abscess without bleeding CPT copyright 2019 [employer]. All rights reserved. The codes documented in this report are preliminary and upon coder review may  be revised to meet current compliance requirements. Andrey Farmer, MD Andrey Farmer MD, MD 10/30/2019 11:44:23 AM Number of Addenda: 0 Note Initiated On: 10/30/2019 10:44 AM Scope Withdrawal Time: 0 hours 15 minutes 5 seconds  Total Procedure Duration: 0 hours 24 minutes 52 seconds  Estimated Blood Loss:  Estimated blood loss was minimal.      [facility]

## 2019-10-30 NOTE — Op Note (Signed)
[facility] Gastroenterology Patient Name: Wendy Sanchez Procedure Date: 10/30/2019 10:45 AM MRN: [id number] Account #: [id number] Date of Birth: 1950-03-05 Admit Type: Outpatient Age: 69 Room: [facility] ENDO ROOM 4 Gender: Female Note Status: Finalized Procedure:             Upper GI endoscopy Indications:           Dysphagia Providers:             Andrey Farmer MD, MD Referring MD:          Irven Easterly. Kary Kos, MD (Referring MD) Medicines:             Monitored Anesthesia Care Complications:         No immediate complications. Estimated blood loss:                         Minimal. Procedure:             Pre-Anesthesia Assessment:                        - Prior to the procedure, a History and Physical was                         performed, and patient medications and allergies were                         reviewed. The patient is competent. The risks and                         benefits of the procedure and the sedation options and                         risks were discussed with the patient. All questions                         were answered and informed consent was obtained.                         Patient identification and proposed procedure were                         verified by the physician, the nurse, the anesthetist                         and the technician in the endoscopy suite. Mental                         Status Examination: alert and oriented. Airway                         Examination: normal oropharyngeal airway and neck                         mobility. Respiratory Examination: clear to                         auscultation. CV Examination: normal. Prophylactic                         Antibiotics:  The patient does not require prophylactic                         antibiotics. Prior Anticoagulants: The patient has                         taken Eliquis (apixaban), last dose was 3 days prior                         to procedure. ASA Grade  Assessment: III - A patient                         with severe systemic disease. After reviewing the                         risks and benefits, the patient was deemed in                         satisfactory condition to undergo the procedure. The                         anesthesia plan was to use monitored anesthesia care                         (MAC). Immediately prior to administration of                         medications, the patient was re-assessed for adequacy                         to receive sedatives. The heart rate, respiratory                         rate, oxygen saturations, blood pressure, adequacy of                         pulmonary ventilation, and response to care were                         monitored throughout the procedure. The physical                         status of the patient was re-assessed after the                         procedure.                        After obtaining informed consent, the endoscope was                         passed under direct vision. Throughout the procedure,                         the patient's blood pressure, pulse, and oxygen                         saturations were monitored continuously. The Endoscope  was introduced through the mouth, and advanced to the                         second part of duodenum. The upper GI endoscopy was                         accomplished without difficulty. The patient tolerated                         the procedure well. Findings:      The examined esophagus was normal. 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 Estimated blood loss was minimal.      A medium-sized hiatal hernia was present.      Evidence of a patent vertical banded gastroplasty was found. A gastric       pouch with a normal size was found. Evidence of a Silastic band was not       seen.      The examined  duodenum was normal. Impression:            - Normal esophagus. Biopsied.                        - Medium-sized hiatal hernia.                        - Patent vertical banded gastroplasty with a                         normal-sized pouch.                        - Normal examined duodenum. Recommendation:        - Discharge patient to home.                        - Resume previous diet.                        - Continue present medications.                        - Await pathology results.                        - Return to referring physician as previously                         scheduled.                        - Resume Eliquis (apixaban) at prior dose today. Procedure Code(s):     --- Professional ---                        [id number], Esophagogastroduodenoscopy, flexible,                         transoral; with biopsy, single or multiple Diagnosis Code(s):     --- Professional ---  K44.9, Diaphragmatic hernia without obstruction or                         gangrene                        Z98.84, Bariatric surgery status                        R13.10, Dysphagia, unspecified CPT copyright 2019 American Medical Association. All rights reserved. The codes documented in this report are preliminary and upon coder review may  be revised to meet current compliance requirements. Andrey Farmer, MD Andrey Farmer MD, MD 10/30/2019 11:38:02 AM Number of Addenda: 0 Note Initiated On: 10/30/2019 10:45 AM Estimated Blood Loss:  Estimated blood loss was minimal.      [facility]

## 2019-10-30 NOTE — Transfer of Care (Signed)
Immediate Anesthesia Transfer of Care Note  Patient: Wendy Sanchez  Procedure(s) Performed: COLONOSCOPY (N/A ) ESOPHAGOGASTRODUODENOSCOPY (EGD) (N/A )  Patient Location: PACU  Anesthesia Type:General  Level of Consciousness: sedated  Airway & Oxygen Therapy: Patient Spontanous Breathing  Post-op Assessment: Report given to RN and Post -op Vital signs reviewed and stable  Post vital signs: Reviewed and stable  Last Vitals:  Vitals Value Taken Time  BP    Temp    Pulse 80 10/30/19 1140  Resp 16 10/30/19 1140  SpO2 100 % 10/30/19 1140  Vitals shown include unvalidated device data.  Last Pain:  Vitals:   10/30/19 1029  TempSrc: Temporal  PainSc: 7          Complications: No complications documented.

## 2019-10-30 NOTE — Interval H&P Note (Signed)
History and Physical Interval Note:  10/30/2019 10:34 AM  Wendy Sanchez  has presented today for surgery, with the diagnosis of Dysphagia, history of adenomatous polyp.  The various methods of treatment have been discussed with the patient and family. After consideration of risks, benefits and other options for treatment, the patient has consented to  Procedure(s): COLONOSCOPY (N/A) ESOPHAGOGASTRODUODENOSCOPY (EGD) (N/A) as a surgical intervention.  The patient's history has been reviewed, patient examined, no change in status, stable for surgery.  I have reviewed the patient's chart and labs.  Questions were answered to the patient's satisfaction.     Lesly Rubenstein  Ok to proceed with EGD/Colonoscopy

## 2019-10-31 ENCOUNTER — Encounter: Payer: Self-pay | Admitting: Gastroenterology

## 2019-10-31 LAB — SURGICAL PATHOLOGY

## 2019-11-13 ENCOUNTER — Telehealth: Payer: Self-pay | Admitting: Anesthesiology

## 2019-11-13 NOTE — Telephone Encounter (Signed)
[facility] is telling patient they do not have any scripts for Oxycodone to fill for Nov or Dec. Please call pharmacy and let patient know status. She has scripts in chart to fill on 10-29 and 11-28

## 2019-11-14 MED ORDER — OXYCODONE-ACETAMINOPHEN 10-325 MG PO TABS
1.0000 | ORAL_TABLET | ORAL | 0 refills | Status: AC | PRN
Start: 2019-11-14 — End: 2019-12-14

## 2019-11-14 NOTE — Telephone Encounter (Signed)
Tried to contact patient. No answer and no way to leave voicemail.

## 2019-11-14 NOTE — Telephone Encounter (Signed)
Dr. Holley Raring,                This is a Dr. Andree Elk patient whose script for this month did not go through to the pharmacy for some reason. In his absence, would you be willing to e-scribe a refill for 1 month supply? If so it is for the oxycodone 10mg  , #135 ( the quantity seems strange, but he is trying to wean her down and it has been this quantity for  few months. Thanks in advance. Fill date would be today.

## 2019-11-14 NOTE — Telephone Encounter (Signed)
Requested Prescriptions   Signed Prescriptions Disp Refills  . oxyCODONE-acetaminophen (PERCOCET) 10-325 MG tablet 135 tablet 0    Sig: Take 1 tablet by mouth every 4 (four) hours as needed for pain.    Authorizing Provider: Gillis Santa

## 2019-11-14 NOTE — Telephone Encounter (Signed)
Patient Cell Phone [phone] please use this number. She called pharmacy again and they still tell her they have not scripts

## 2019-11-20 ENCOUNTER — Other Ambulatory Visit: Payer: Self-pay | Admitting: Gastroenterology

## 2019-11-20 DIAGNOSIS — R1314 Dysphagia, pharyngoesophageal phase: Secondary | ICD-10-CM

## 2019-11-23 ENCOUNTER — Ambulatory Visit
Admission: RE | Admit: 2019-11-23 | Discharge: 2019-11-23 | Disposition: A | Discharge status: Home / Self Care | Payer: Medicare Other | Source: Ambulatory Visit | Attending: Gastroenterology | Admitting: Gastroenterology

## 2019-11-23 ENCOUNTER — Other Ambulatory Visit: Payer: Self-pay

## 2019-11-23 DIAGNOSIS — R1314 Dysphagia, pharyngoesophageal phase: Secondary | ICD-10-CM

## 2019-12-05 ENCOUNTER — Telehealth: Payer: Self-pay | Admitting: *Deleted

## 2019-12-05 ENCOUNTER — Telehealth: Payer: Self-pay

## 2019-12-05 NOTE — Telephone Encounter (Signed)
Spoke with patient and let her know that she does have 1 more Rx to be filled on 12/10/19 for her oxycodone - apap 10/325 mg at [facility] in [place]. Patient verbalizes u/o information.

## 2019-12-05 NOTE — Telephone Encounter (Signed)
Dr. Holley Raring did her visit last month and only gave her one script, and told her that Dr. Andree Elk would call out the remaining. She wants to know if that has been done, she runs out on 12/1. Please call her and let her know.

## 2019-12-12 ENCOUNTER — Telehealth: Payer: Self-pay | Admitting: Anesthesiology

## 2019-12-12 NOTE — Telephone Encounter (Signed)
Patient called stating pharmacy is telling her they do not have a script for her pain med, which should have been filled on 11-28 Please call pharmacy and find out what the problem is and let patient know

## 2019-12-13 NOTE — Telephone Encounter (Signed)
Attempted to call [place]. Was placed on hold for several miinutes. Hung up without speaking with anyone.

## 2019-12-14 ENCOUNTER — Telehealth: Payer: Self-pay | Admitting: Anesthesiology

## 2019-12-14 ENCOUNTER — Other Ambulatory Visit: Payer: Self-pay | Admitting: Student in an Organized Health Care Education/Training Program

## 2019-12-14 MED ORDER — OXYCODONE-ACETAMINOPHEN 10-325 MG PO TABS
1.0000 | ORAL_TABLET | ORAL | 0 refills | Status: DC | PRN
Start: 2019-12-14 — End: 2020-01-09

## 2019-12-14 NOTE — Telephone Encounter (Signed)
Patient has contacted pharmacy to fill script but they are telling her they do not have a script to fill ? Please call pharmacy and then patient. Thank you

## 2019-12-14 NOTE — Progress Notes (Unsigned)
Requested Prescriptions   Signed Prescriptions Disp Refills  . oxyCODONE-acetaminophen (PERCOCET) 10-325 MG tablet 135 tablet 0    Sig: Take 1 tablet by mouth every 4 (four) hours as needed for pain.

## 2019-12-14 NOTE — Telephone Encounter (Signed)
Spoke with patient and she is out of her medication and the pharmacy does not have a refill.  Looked at Rx that was written on 12/10/19 and it was not received by pharmacy.  Spoke with Wendy Sanchez and he is not in the hospital today.  He has asked if Wendy Sanchez could send this Rx in.  Have spoken with Wendy Sanchez and he is glad to do this for Wendy Sanchez and patient.    Called Wendy Sanchez back to let her know that Wendy Sanchez will send in the Rx for oxycodone - apap 10-325, apologized for the inconvenience and am unsure as to what went wrong with Rx.  Patient will check with [facility] shortly.  If any problems, I told her I would be here tomorrow and to please let me know.  Patient verbalizes u/o information.

## 2020-01-08 ENCOUNTER — Other Ambulatory Visit: Payer: Self-pay

## 2020-01-08 ENCOUNTER — Ambulatory Visit: Payer: Medicare Other | Attending: Anesthesiology | Admitting: Anesthesiology

## 2020-01-08 DIAGNOSIS — M5136 Other intervertebral disc degeneration, lumbar region: Secondary | ICD-10-CM | POA: Diagnosis not present

## 2020-01-08 DIAGNOSIS — G894 Chronic pain syndrome: Secondary | ICD-10-CM

## 2020-01-08 DIAGNOSIS — M48062 Spinal stenosis, lumbar region with neurogenic claudication: Secondary | ICD-10-CM

## 2020-01-08 DIAGNOSIS — F1190 Opioid use, unspecified, uncomplicated: Secondary | ICD-10-CM | POA: Diagnosis not present

## 2020-01-08 DIAGNOSIS — M5441 Lumbago with sciatica, right side: Secondary | ICD-10-CM

## 2020-01-08 DIAGNOSIS — M5442 Lumbago with sciatica, left side: Secondary | ICD-10-CM

## 2020-01-08 DIAGNOSIS — G8929 Other chronic pain: Secondary | ICD-10-CM

## 2020-01-08 DIAGNOSIS — M19011 Primary osteoarthritis, right shoulder: Secondary | ICD-10-CM

## 2020-01-08 DIAGNOSIS — M25551 Pain in right hip: Secondary | ICD-10-CM

## 2020-01-08 DIAGNOSIS — M25552 Pain in left hip: Secondary | ICD-10-CM

## 2020-01-09 ENCOUNTER — Encounter: Payer: Self-pay | Admitting: Anesthesiology

## 2020-01-09 MED ORDER — OXYCODONE-ACETAMINOPHEN 10-325 MG PO TABS: 1.0000 | ORAL_TABLET | ORAL | 0 refills | Status: AC | PRN

## 2020-01-09 MED ORDER — OXYCODONE-ACETAMINOPHEN 10-325 MG PO TABS
1.0000 | ORAL_TABLET | ORAL | 0 refills | Status: DC | PRN
Start: 2020-02-12 — End: 2020-03-05

## 2020-01-09 NOTE — Progress Notes (Signed)
Virtual Visit via Telephone Note  I connected with ELISAH MARKING on 01/09/20 at  2:45 PM EST by telephone and verified that I am speaking with the correct person using two identifiers.  Location: Patient: Home Provider: Pain control center   I discussed the limitations, risks, security and privacy concerns of performing an evaluation and management service by telephone and the availability of in person appointments. I also discussed with the patient that there may be a patient responsible charge related to this service. The patient expressed understanding and agreed to proceed.   History of Present Illness: I spoke with [name] today via telephone as she was unable to do the video portion of the virtual conference.  She reports that her pain has been substantial lately.  Is mainly in the low back shoulders and knees.  It is reportedly and consistently worse in the wintertime.  She has been taking her medications as prescribed and these continue to give her good relief but her duration of relief is shorter.  She is taking an average of 4 or 5 tablets on a daily basis panned out accordingly.  This works well for her and keeps her pain under reasonable control.  She does have difficulty sleeping but takes her gabapentin to assist with that and pain management throughout the night.  No change in the quality characteristic or distribution of the pain is otherwise noted.  Her strength also remains at baseline.  She is taking her medications as prescribed and these work well for her without side effect.   Observations/Objective:  Current Outpatient Medications:  .  [START ON 02/12/2020] oxyCODONE-acetaminophen (PERCOCET) 10-325 MG tablet, Take 1 tablet by mouth every 4 (four) hours as needed for pain., Disp: 135 tablet, Rfl: 0 .  albuterol (PROVENTIL HFA;VENTOLIN HFA) 108 (90 Base) MCG/ACT inhaler, Inhale 2 puffs into the lungs every 6 (six) hours as needed for wheezing or shortness of breath., Disp: ,  Rfl:  .  apixaban (ELIQUIS) 5 MG TABS tablet, Take 1 tablet (5 mg total) by mouth 2 (two) times daily., Disp: 60 tablet, Rfl: 0 .  Ascorbic Acid (VITAMIN C PO), Take 2 tablets by mouth daily., Disp: , Rfl:  .  aspirin EC 81 MG tablet, Take 1 tablet (81 mg total) daily by mouth. (Patient not taking: Reported on 10/30/2019), Disp: 90 tablet, Rfl: 3 .  azelastine (ASTELIN) 0.1 % nasal spray, Place 1 spray into both nostrils 2 (two) times daily as needed for allergies. Use in each nostril as directed , Disp: , Rfl:  .  carvedilol (COREG) 6.25 MG tablet, Take 1 tablet (6.25 mg total) by mouth 2 (two) times daily., Disp: 180 tablet, Rfl: 3 .  cyclobenzaprine (FLEXERIL) 10 MG tablet, Take 1 tablet (10 mg total) by mouth 3 (three) times daily as needed. for muscle spams, Disp: 270 tablet, Rfl: 1 .  Dextromethorphan-guaiFENesin (CORICIDIN HBP CONGESTION/COUGH) 10-200 MG CAPS, Take 2 tablets by mouth 2 (two) times daily as needed (cough / congestion)., Disp: , Rfl:  .  diclofenac sodium (VOLTAREN) 1 % GEL, Apply 2 g topically 4 (four) times daily as needed (pain). , Disp: , Rfl:  .  diphenhydrAMINE (BENADRYL) 25 mg capsule, Take 50 mg by mouth every 6 (six) hours as needed for allergies. , Disp: , Rfl:  .  docusate sodium (COLACE) 100 MG capsule, Take 100 mg by mouth at bedtime as needed for mild constipation. , Disp: , Rfl:  .  ELDERBERRY PO, Take 100 mg by mouth  daily. , Disp: , Rfl:  .  fluconazole (DIFLUCAN) 100 MG tablet, Take 100 mg by mouth daily as needed (yeast infections)., Disp: , Rfl:  .  folic acid (FOLVITE) 1 MG tablet, Take 1 mg by mouth at bedtime. , Disp: , Rfl:  .  furosemide (LASIX) 40 MG tablet, Take 2 tablets (80 mg) by mouth once daily as directed (Patient taking differently: Take 80 mg by mouth daily. ), Disp: 180 tablet, Rfl: 3 .  gabapentin (NEURONTIN) 300 MG capsule, Take 2 capsules (600 mg total) by mouth See admin instructions. Take 600 mg by mouth twice daily, may take a third 600  mg dose as needed for pain, Disp: 540 capsule, Rfl: 0 .  gentamicin ointment (GARAMYCIN) 0.1 %, Apply 1 application topically 5 (five) times daily as needed (boil). , Disp: , Rfl:  .  Guaifenesin (MUCINEX MAXIMUM STRENGTH) 1200 MG TB12, Take 1,200 mg by mouth 2 (two) times daily as needed (congestion)., Disp: , Rfl:  .  ipratropium-albuterol (DUONEB) 0.5-2.5 (3) MG/3ML SOLN, Take 3 mLs by nebulization every 6 (six) hours as needed (shortness of breath)., Disp: , Rfl:  .  levocetirizine (XYZAL) 5 MG tablet, Take 5 mg by mouth at bedtime. , Disp: , Rfl:  .  mirabegron ER (MYRBETRIQ) 50 MG TB24 tablet, Take 50 mg by mouth daily. (Patient not taking: Reported on 10/30/2019), Disp: , Rfl:  .  montelukast (SINGULAIR) 10 MG tablet, Take 10 mg by mouth at bedtime., Disp: , Rfl:  .  Multiple Vitamins-Minerals (MULTIVITAMIN GUMMIES ADULTS) CHEW, Chew 2 tablets by mouth daily. , Disp: , Rfl:  .  nortriptyline (PAMELOR) 50 MG capsule, Take 50 mg by mouth at bedtime., Disp: , Rfl:  .  nystatin (MYCOSTATIN) 100000 UNIT/ML suspension, Take 5 mLs by mouth 4 (four) times daily as needed (yeast). , Disp: , Rfl:  .  nystatin cream (MYCOSTATIN), Apply 1 application topically 2 (two) times daily as needed (yeast)., Disp: , Rfl:  .  ondansetron (ZOFRAN) 4 MG tablet, Take 1 tablet (4 mg total) by mouth every 8 (eight) hours as needed for up to 10 doses for nausea or vomiting., Disp: 20 tablet, Rfl: 0 .  [START ON 01/12/2020] oxyCODONE-acetaminophen (PERCOCET) 10-325 MG tablet, Take 1 tablet by mouth every 4 (four) hours as needed for pain., Disp: 135 tablet, Rfl: 0 .  pantoprazole (PROTONIX) 40 MG tablet, Take 40 mg by mouth 2 (two) times daily., Disp: , Rfl:  .  pilocarpine (SALAGEN) 5 MG tablet, Take 5 mg by mouth 2 (two) times daily. , Disp: , Rfl:  .  Polyvinyl Alcohol-Povidone (REFRESH OP), Place 1 drop into both eyes daily as needed (dry eyes)., Disp: , Rfl:  .  potassium chloride SA (K-DUR,KLOR-CON) 20 MEQ tablet,  Take 20 mEq by mouth 3 days. , Disp: , Rfl:  .  predniSONE (DELTASONE) 20 MG tablet, Take 20 mg by mouth daily. 3 tabs daily at night, Disp: , Rfl:  .  Probiotic Product (PROBIOTIC COLON SUPPORT) CAPS, Take 1 capsule by mouth at bedtime., Disp: , Rfl:  .  sacubitril-valsartan (ENTRESTO) 49-51 MG, Take 1 tablet by mouth 2 (two) times daily., Disp: , Rfl:  .  sucralfate (CARAFATE) 1 G tablet, Take 1 g by mouth 2 (two) times daily before a meal. , Disp: , Rfl:  .  umeclidinium-vilanterol (ANORO ELLIPTA) 62.5-25 MCG/INH AEPB, Inhale 1 puff into the lungs daily., Disp: , Rfl:  .  Vitamin D, Ergocalciferol, (DRISDOL) 50000 UNITS CAPS capsule, Take  50,000 Units by mouth every Thursday. , Disp: , Rfl:  .  Xylitol (XYLIMELTS) 550 MG DISK, Use as directed 550 mg in the mouth or throat as needed. , Disp: , Rfl:   Assessment and Plan:  1. DDD (degenerative disc disease), lumbar   2. Chronic bilateral low back pain with bilateral sciatica   3. Chronic, continuous use of opioids   4. Chronic pain syndrome   5. Hip pain, bilateral   6. Chronic pain of right hip   7. Primary osteoarthritis of right shoulder   8. Spinal stenosis of lumbar region with neurogenic claudication   Based on our discussion today and upon review of the [facility] practitioner database information when to refill her medications.  This will be for the next 2 months.  Be dated for December 31 in January 31.  No other changes will be made in her pain management protocol.  I have encouraged her to continue with stretching strengthening as tolerated and to stay active to the best of her ability.  She is to follow-up with her primary care physicians for her other baseline medical problems.  Return to clinic in 2 months Follow Up Instructions:    I discussed the assessment and treatment plan with the patient. The patient was provided an opportunity to ask questions and all were answered. The patient agreed with the plan and demonstrated  an understanding of the instructions.   The patient was advised to call back or seek an in-person evaluation if the symptoms worsen or if the condition fails to improve as anticipated.  I provided 30 minutes of non-face-to-face time during this encounter.   Molli Barrows, MD

## 2020-01-19 ENCOUNTER — Ambulatory Visit
Admission: EM | Admit: 2020-01-19 | Discharge: 2020-01-19 | Disposition: A | Discharge status: Home / Self Care | Payer: Medicare Other | Attending: Physician Assistant | Admitting: Physician Assistant

## 2020-01-19 ENCOUNTER — Encounter: Payer: Self-pay | Admitting: Emergency Medicine

## 2020-01-19 ENCOUNTER — Other Ambulatory Visit: Payer: Self-pay

## 2020-01-19 DIAGNOSIS — Z20822 Contact with and (suspected) exposure to covid-19: Secondary | ICD-10-CM

## 2020-01-19 DIAGNOSIS — R197 Diarrhea, unspecified: Secondary | ICD-10-CM | POA: Insufficient documentation

## 2020-01-19 DIAGNOSIS — U071 COVID-19: Secondary | ICD-10-CM | POA: Insufficient documentation

## 2020-01-19 MED ORDER — PREDNISONE 50 MG PO TABS: ORAL_TABLET | ORAL | 0 refills | Status: DC

## 2020-01-19 MED ORDER — ALBUTEROL SULFATE (2.5 MG/3ML) 0.083% IN NEBU: 2.5000 mg | INHALATION_SOLUTION | Freq: Four times a day (QID) | RESPIRATORY_TRACT | 0 refills | Status: DC | PRN

## 2020-01-19 NOTE — Discharge Instructions (Addendum)
Due to your chronic conditions and if your covid test is positive, you qualify to receive Monoclonal Antibody infusion and if you don't hear from anyone within 24 hours of finding your results, call [phone]  Stay off dairy while having diarrhea and eat bananas, apple sauce, rice, toast, potatoes until is gone.   Hold off from taking your daily prednisone 6 mg while on the 50 mg for 5 days, then re-start it.   Follow up with your family Dr next week   Check your Mychart for results of your covid test.

## 2020-01-19 NOTE — ED Provider Notes (Signed)
[facility]    CSN: [id number] Arrival date & time: 01/19/20  1252      History   Chief Complaint Chief Complaint  Patient presents with  . Otalgia    bilateral    HPI Wendy Sanchez is a 70 y.o. female who presents with onset of  URI, diarrhea, abdominal pain, HA and ear pain x 3 days. She denies having a fever. Has not been around anyone sick. Started wheezing yesterday and has not used her nebulizer since her meds have expired.  Has not had the Covid Injections. Diarrhea onset today and has gone x5 so far.  Has not been on any antibiotics in the past month and has not traveled outside [place].       Past Medical History:  Diagnosis Date  . Allergic rhinitis   . Arthritis   . Asthma    problems with fumes and aerosols cause asthma  . Chest pain 10/19/2016  . Complication of anesthesia    awakens during surgery; has occurred with last 3-4 surgeries   . COPD (chronic obstructive pulmonary disease) ([place])   . DVT (deep venous thrombosis) ([place]) 07/13/2018   Right leg  . Environmental allergies    fumes   . Fibromyalgia   . GERD (gastroesophageal reflux disease)   . Headache   . Hemorrhoids   . History of bronchitis   . Hx of total knee arthroplasty 12/13/2015  . Hypertension   . Morbid obesity with BMI of 45.0-49.9, adult ([place]) 07/25/2015  . NICM (nonischemic cardiomyopathy) ([place])    a. ? PVC mediated;  b. 06/2016 Echo: EF 25-30%, mild LVH, mild LAE, mild AI/MR/TR/PR; c. 08/2016 Cath: nl cors, EF 25%.  . Numbness    hands bilat when driving; improves when not preforming task   . Osteoarthritis of left knee 08/02/2015  . PE (pulmonary thromboembolism) ([place]) 07/13/2018  . Pes anserinus bursitis of left knee 05/18/2016  . Presence of right artificial knee joint 05/18/2016  . PVC (premature ventricular contraction) 06/04/2017  . PVC's (premature ventricular contractions)    a. 11/2016 Amio started;  b. 11/29/16 48h Holter: [id number] PVC's (41%); c. 12/2016 24h Holter:  18040 PVC's (15%); c. 02/2017 48h Holter: 37262 PVC's (41%).  . Sleep apnea    cannot tolerate CPAP  . Spinal stenosis of lumbar region 06/19/2015  . Status post gastric banding   . 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 Tinnitus    comes and goes   . Unilateral primary osteoarthritis, right knee 12/13/2015  . Vertigo    none for over 2 yrs    Patient Active Problem List   Diagnosis Date Noted  . Frontal headache 05/30/2019  . Carcinoma, renal cell, left ([place]) 11/14/2018  . Pulmonary embolism ([place]) 07/13/2018  . DDD (degenerative disc disease), lumbar 06/30/2018  . Chronic low back pain 06/30/2018  . Hip pain, bilateral 06/30/2018  . Chronic, continuous use of opioids 06/30/2018  . PVC (premature ventricular contraction) 06/04/2017  . Chest pain 10/19/2016  . Presence of right artificial knee joint 05/18/2016  . Pes anserinus bursitis of left knee 05/18/2016  . Unilateral primary osteoarthritis, right knee 12/13/2015  . Hx of total knee arthroplasty 12/13/2015  . Osteoarthritis of left knee 08/02/2015  . 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 Morbid obesity with BMI of 45.0-49.9, adult ([place]) 07/25/2015  . Spinal stenosis of lumbar region 06/19/2015  . Chronic pain syndrome 10/30/2014    Past Surgical History:  Procedure Laterality Date  . ABDOMINAL HYSTERECTOMY  1979   left ovary remains  . ABDOMINAL HYSTERECTOMY    . ARTERY BIOPSY Left 06/07/2019   Procedure: BIOPSY TEMPORAL ARTERY;  Surgeon: Algernon Huxley, MD;  Location: [facility] ORS;  Service: Vascular;  Laterality: Left;  . CHOLECYSTECTOMY    . COLONOSCOPY    . COLONOSCOPY N/A 10/30/2019   Procedure: COLONOSCOPY;  Surgeon: Lesly Rubenstein, MD;  Location: [facility] ENDOSCOPY;  Service: Endoscopy;  Laterality: N/A;  . ESOPHAGOGASTRODUODENOSCOPY N/A 10/30/2019   Procedure: ESOPHAGOGASTRODUODENOSCOPY (EGD);  Surgeon: Lesly Rubenstein, MD;  Location: [facility] ENDOSCOPY;  Service: Endoscopy;  Laterality:  N/A;  . fibrous tissue removed from right shoulder and back of neck      2 years ago   . FLOOR OF MOUTH BIOPSY N/A 02/28/2019   Procedure: SALIVARY GLAND BIOPSY;  Surgeon: Carloyn Manner, MD;  Location: [facility] ORS;  Service: ENT;  Laterality: N/A;  . FRACTURE SURGERY     left small finger  . GANGLION CYST EXCISION    . GASTRIC BYPASS  1980   states had bypass with banding and band left in -  . GASTRIC BYPASS OPEN    . JOINT REPLACEMENT Bilateral 2017   knees  . KNEE SURGERY Bilateral    both knees  2001  . NECK SURGERY N/A 1205/19   ganglion cyst removal, [place]   . PULMONARY THROMBECTOMY Bilateral 07/14/2018   Procedure: PULMONARY THROMBECTOMY;  Surgeon: Algernon Huxley, MD;  Location: [facility];  Service: Cardiovascular;  Laterality: Bilateral;  . PVC ABLATION N/A 06/04/2017   Procedure: PVC ABLATION;  Surgeon: Evans Lance, MD;  Location: [place] [facility];  Service: Cardiovascular;  Laterality: N/A;  . RIGHT/LEFT HEART CATH AND CORONARY ANGIOGRAPHY Bilateral 08/12/2016   Procedure: Right/Left Heart Cath and Coronary Angiography;  Surgeon: Yolonda Kida, MD;  Location: [facility];  Service: Cardiovascular;  Laterality: Bilateral;  . TONSILLECTOMY     age 52  . TOTAL KNEE ARTHROPLASTY Left 08/02/2015   Procedure: LEFT TOTAL KNEE ARTHROPLASTY;  Surgeon: Mcarthur Rossetti, MD;  Location: [facility] ORS;  Service: Orthopedics;  Laterality: Left;  . TOTAL KNEE ARTHROPLASTY Right 12/13/2015   Procedure: RIGHT TOTAL KNEE ARTHROPLASTY;  Surgeon: Mcarthur Rossetti, MD;  Location: [facility] ORS;  Service: Orthopedics;  Laterality: Right;    OB History   No obstetric history on file.      Home Medications    Prior to Admission medications   Medication Sig Start Date End Date Taking? Authorizing Provider  apixaban (ELIQUIS) 5 MG TABS tablet Take 1 tablet (5 mg total) by mouth 2 (two) times daily. 07/22/18 06/07/19 Yes Pyreddy, Reatha Harps, MD  Ascorbic Acid (VITAMIN C PO) Take  2 tablets by mouth daily.   Yes [provider]  azelastine (ASTELIN) 0.1 % nasal spray Place 1 spray into both nostrils 2 (two) times daily as needed for allergies. Use in each nostril as directed   Yes [provider]  carvedilol (COREG) 6.25 MG tablet Take 1 tablet (6.25 mg total) by mouth 2 (two) times daily. 07/28/18  Yes Deboraha Sprang, MD  cyclobenzaprine (FLEXERIL) 10 MG tablet Take 1 tablet (10 mg total) by mouth 3 (three) times daily as needed. for muscle spams 09/08/17  Yes Molli Barrows, MD  Dextromethorphan-guaiFENesin (CORICIDIN HBP CONGESTION/COUGH) 10-200 MG CAPS Take 2 tablets by mouth 2 (two) times daily as needed (cough / congestion).   Yes [provider]  diclofenac  sodium (VOLTAREN) 1 % GEL Apply 2 g topically 4 (four) times daily as needed (pain).    Yes [provider]  diphenhydrAMINE (BENADRYL) 25 mg capsule Take 50 mg by mouth every 6 (six) hours as needed for allergies.    Yes [provider]  docusate sodium (COLACE) 100 MG capsule Take 100 mg by mouth at bedtime as needed for mild constipation.    Yes [provider]  ELDERBERRY PO Take 100 mg by mouth daily.    Yes [provider]  fluconazole (DIFLUCAN) 100 MG tablet Take 100 mg by mouth daily as needed (yeast infections).   Yes [provider]  folic acid (FOLVITE) 1 MG tablet Take 1 mg by mouth at bedtime.    Yes [provider]  gabapentin (NEURONTIN) 300 MG capsule Take 2 capsules (600 mg total) by mouth See admin instructions. Take 600 mg by mouth twice daily, may take a third 600 mg dose as needed for pain 04/25/19 07/24/19 Yes Molli Barrows, MD  gentamicin ointment (GARAMYCIN) 0.1 % Apply 1 application topically 5 (five) times daily as needed (boil).  10/14/18  Yes [provider]  Guaifenesin 1200 MG TB12 Take 1,200 mg by mouth 2 (two) times daily as needed (congestion).   Yes [provider]  ipratropium-albuterol  (DUONEB) 0.5-2.5 (3) MG/3ML SOLN Take 3 mLs by nebulization every 6 (six) hours as needed (shortness of breath).   Yes [provider]  levocetirizine (XYZAL) 5 MG tablet Take 5 mg by mouth at bedtime.    Yes [provider]  mirabegron ER (MYRBETRIQ) 50 MG TB24 tablet Take 50 mg by mouth daily.   Yes [provider]  montelukast (SINGULAIR) 10 MG tablet Take 10 mg by mouth at bedtime.   Yes [provider]  Multiple Vitamins-Minerals (MULTIVITAMIN GUMMIES ADULTS) CHEW Chew 2 tablets by mouth daily.    Yes [provider]  nortriptyline (PAMELOR) 50 MG capsule Take 50 mg by mouth at bedtime.   Yes [provider]  nystatin (MYCOSTATIN) 100000 UNIT/ML suspension Take 5 mLs by mouth 4 (four) times daily as needed (yeast).    Yes [provider]  nystatin cream (MYCOSTATIN) Apply 1 application topically 2 (two) times daily as needed (yeast).   Yes [provider]  oxyCODONE-acetaminophen (PERCOCET) 10-325 MG tablet Take 1 tablet by mouth every 4 (four) hours as needed for pain. 02/12/20 03/13/20 Yes Molli Barrows, MD  pantoprazole (PROTONIX) 40 MG tablet Take 40 mg by mouth 2 (two) times daily.   Yes [provider]  pilocarpine (SALAGEN) 5 MG tablet Take 5 mg by mouth 2 (two) times daily.    Yes [provider]  Polyvinyl Alcohol-Povidone (REFRESH OP) Place 1 drop into both eyes daily as needed (dry eyes).   Yes [provider]  potassium chloride SA (K-DUR,KLOR-CON) 20 MEQ tablet Take 20 mEq by mouth 3 days.    Yes [provider]  predniSONE (DELTASONE) 20 MG tablet Take 20 mg by mouth daily. 3 tabs daily at night   Yes [provider]  Probiotic Product (PROBIOTIC COLON SUPPORT) CAPS Take 1 capsule by mouth at bedtime.   Yes [provider]  sacubitril-valsartan (ENTRESTO) 49-51 MG Take 1 tablet by mouth 2 (two) times daily.   Yes [provider]  sucralfate (CARAFATE)  1 G tablet Take 1 g by mouth 2 (two) times daily before a meal.   Yes [provider]  umeclidinium-vilanterol (ANORO ELLIPTA)  62.5-25 MCG/INH AEPB Inhale 1 puff into the lungs daily. 12/23/17  Yes [provider]  Vitamin D, Ergocalciferol, (DRISDOL) 50000 UNITS CAPS capsule Take 50,000 Units by mouth every Thursday.    Yes [provider]  Xylitol (XYLIMELTS) 550 MG DISK Use as directed 550 mg in the mouth or throat as needed.    Yes [provider]  albuterol (PROVENTIL HFA;VENTOLIN HFA) 108 (90 Base) MCG/ACT inhaler Inhale 2 puffs into the lungs every 6 (six) hours as needed for wheezing or shortness of breath.    [provider]  aspirin EC 81 MG tablet Take 1 tablet (81 mg total) daily by mouth. Patient not taking: Reported on 10/30/2019 11/18/16   Deboraha Sprang, MD  furosemide (LASIX) 40 MG tablet Take 2 tablets (80 mg) by mouth once daily as directed Patient taking differently: Take 80 mg by mouth daily.  07/28/18   Deboraha Sprang, MD  ondansetron (ZOFRAN) 4 MG tablet Take 1 tablet (4 mg total) by mouth every 8 (eight) hours as needed for up to 10 doses for nausea or vomiting. 02/28/19   Carloyn Manner, MD  oxyCODONE-acetaminophen (PERCOCET) 10-325 MG tablet Take 1 tablet by mouth every 4 (four) hours as needed for pain. 01/12/20 02/11/20  Molli Barrows, MD    Family History Family History  Problem Relation Age of Onset  . Hypertension Father   . Deep vein thrombosis Father   . Dementia Mother   . Diabetes Sister   . Congestive Heart Failure Sister   . Congestive Heart Failure Brother   . Congestive Heart Failure Daughter   . Congestive Heart Failure Son   . Breast cancer Maternal Aunt        great aunt    Social History Social History   Tobacco Use  . Smoking status: Former Smoker    Packs/day: 0.25    Years: 50.00    Pack years: 12.50    Types: Cigarettes    Quit date: 07/06/2018    Years since quitting: 1.5  . Smokeless  tobacco: Current User    Types: Chew  Vaping Use  . Vaping Use: Never used  Substance Use Topics  . Alcohol use: No    Alcohol/week: 0.0 standard drinks  . Drug use: No     Allergies   Hydrocodone-acetaminophen, Iodine, Other, Tape, Peanut oil, and Shellfish allergy   Review of Systems Review of Systems  Constitutional: Positive for fatigue. Negative for appetite change, chills, diaphoresis and fever.  HENT: Positive for congestion, ear pain, postnasal drip and rhinorrhea. Negative for dental problem, ear discharge and sore throat.   Eyes: Negative for discharge.  Respiratory: Positive for cough and wheezing. Negative for chest tightness.   Cardiovascular: Negative for chest pain.  Gastrointestinal: Positive for diarrhea. Negative for nausea and vomiting.  Musculoskeletal: Negative for myalgias.  Skin: Negative for rash.  Neurological: Positive for headaches.  Hematological: Negative for adenopathy.   Physical Exam Triage Vital Signs ED Triage Vitals  Enc Vitals Group     BP 01/19/20 1413 (!) 148/89     Pulse Rate 01/19/20 1413 78     Resp 01/19/20 1413 18     Temp 01/19/20 1413 98.6 F (37 C)     Temp Source 01/19/20 1413 Oral     SpO2 01/19/20 1413 99 %     Weight 01/19/20 1409 296 lb 15.4 oz (134.7 kg)     Height 01/19/20 1409 5\' 7"  (1.702 m)  Head Circumference --      Peak Flow --      Pain Score 01/19/20 1408 8     Pain Loc --      Pain Edu? --      Excl. in [place]? --    No data found.  Updated Vital Signs BP (!) 148/89 (BP Location: Right Arm)   Pulse 78   Temp 98.6 F (37 C) (Oral)   Resp 18   Ht 5\' 7"  (1.702 m)   Wt 296 lb 15.4 oz (134.7 kg)   SpO2 99%   BMI 46.51 kg/m   Visual Acuity Right Eye Distance:   Left Eye Distance:   Bilateral Distance:    Right Eye Near:   Left Eye Near:    Bilateral Near:     Physical Exam Physical Exam Vitals signs and nursing note reviewed.  Constitutional:      General: She is not in acute distress.     Appearance: Normal appearance. She is not ill-appearing, toxic-appearing or diaphoretic.  HENT:     Head: Normocephalic.     Right Ear: Tympanic membrane, ear canal and external ear normal.     Left Ear: Tympanic membrane, ear canal and external ear normal.     Nose: mild congestion with clear rhinitis      Mouth/Throat: clear    Mouth: Mucous membranes are moist.  Eyes:     General: No scleral icterus.       Right eye: No discharge.        Left eye: No discharge.     Conjunctiva/sclera: Conjunctivae normal.  Neck:     Musculoskeletal: Neck supple. No neck rigidity.  Cardiovascular:     Rate and Rhythm: Normal rate and regular rhythm.     Heart sounds: No murmur.  Pulmonary:     Effort: Pulmonary effort is normal.     Breath sounds: has expiratory wheezing throughout Abdominal:     General: Bowel sounds are normal. There is no distension.     Palpations: Abdomen is soft. There is no mass.     Tenderness: There is mild generalized abdominal tenderness. There is no guarding or rebound.     Hernia: No hernia is present.  Musculoskeletal: Normal range of motion.  Lymphadenopathy:     Cervical: No cervical adenopathy.  Skin:    General: Skin is warm and dry.     Coloration: Skin is not jaundiced.     Findings: No rash.  Neurological:     Mental Status: She is alert and oriented to person, place, and time.     Gait: Gait normal.  Psychiatric:        Mood and Affect: Mood normal.        Behavior: Behavior normal.        Thought Content: Thought content normal.        Judgment: Judgment normal.     UC Treatments / Results  Labs (all labs ordered are listed, but only abnormal results are displayed) Labs Reviewed  SARS CORONAVIRUS 2 (TAT 6-24 HRS)    EKG   Radiology No results found.  Procedures Procedures (including critical care time)  Medications Ordered in UC Medications - No data to display  Initial Impression / Assessment and Plan / UC Course  I have  reviewed the triage vital signs and the nursing notes. URI, and viral bronchitis. I refilled the albuterol for her neb machine and advised to use it q4-6h cor 5-7 days, then  prn. I placed her on Prednisone as noted.  Covid test is pending. She is candidate for MAB if her covid test is positive.  Final Clinical Impressions(s) / UC Diagnoses   Final diagnoses:  None   Discharge Instructions   None    ED Prescriptions    None     PDMP not reviewed this encounter.   Shelby Mattocks, PA-C 01/19/20 2000

## 2020-01-19 NOTE — ED Triage Notes (Signed)
Pt c/o diarrhea, abdominal pain, headache and bilateral ear pain. Started about 3 days ago. Denies fever.

## 2020-01-20 LAB — SARS CORONAVIRUS 2 (TAT 6-24 HRS): SARS Coronavirus 2: POSITIVE — AB

## 2020-01-21 ENCOUNTER — Telehealth: Payer: Self-pay | Admitting: Oncology

## 2020-01-21 ENCOUNTER — Other Ambulatory Visit: Payer: Self-pay | Admitting: Oncology

## 2020-01-21 ENCOUNTER — Encounter: Payer: Self-pay | Admitting: Oncology

## 2020-01-21 MED ORDER — NIRMATRELVIR/RITONAVIR (PAXLOVID)TABLET: 2.0000 | ORAL_TABLET | Freq: Two times a day (BID) | ORAL | 0 refills | Status: AC

## 2020-01-21 NOTE — Telephone Encounter (Signed)
Outpatient Oral COVID Treatment Note  I connected with Wendy Sanchez on 01/21/2020/12:12 PM by telephone and verified that I am speaking with the correct person using two identifiers.  I discussed the limitations, risks, security, and privacy concerns of performing an evaluation and management service by telephone and the availability of in person appointments. I also discussed with the patient that there may be a patient responsible charge related to this service. The patient expressed understanding and agreed to proceed.  Patient location: Home Provider location: Home  Diagnosis: COVID-19 infection  Purpose of visit: Discussion of potential use of Molnupiravir or Paxlovid, a new treatment for mild to moderate COVID-19 viral infection in non-hospitalized patients.   Subjective: Patient is a 70 y.o. female who has been diagnosed with COVID 19 viral infection.  Their symptoms began on 01/17/20.   Past Medical History:  Diagnosis Date  . Allergic rhinitis   . Arthritis   . Asthma    problems with fumes and aerosols cause asthma  . Chest pain 10/19/2016  . Complication of anesthesia    awakens during surgery; has occurred with last 3-4 surgeries   . COPD (chronic obstructive pulmonary disease) ([place])   . DVT (deep venous thrombosis) ([place]) 07/13/2018   Right leg  . Environmental allergies    fumes   . Fibromyalgia   . GERD (gastroesophageal reflux disease)   . Headache   . Hemorrhoids   . History of bronchitis   . Hx of total knee arthroplasty 12/13/2015  . Hypertension   . Morbid obesity with BMI of 45.0-49.9, adult ([place]) 07/25/2015  . NICM (nonischemic cardiomyopathy) ([place])    a. ? PVC mediated;  b. 06/2016 Echo: EF 25-30%, mild LVH, mild LAE, mild AI/MR/TR/PR; c. 08/2016 Cath: nl cors, EF 25%.  . Numbness    hands bilat when driving; improves when not preforming task   . Osteoarthritis of left knee 08/02/2015  . PE (pulmonary thromboembolism) ([place]) 07/13/2018  . Pes anserinus bursitis  of left knee 05/18/2016  . Presence of right artificial knee joint 05/18/2016  . PVC (premature ventricular contraction) 06/04/2017  . PVC's (premature ventricular contractions)    a. 11/2016 [place] started;  b. 11/29/16 48h Holter: 95621 PVC's (41%); c. 12/2016 24h Holter: 18040 PVC's (15%); c. 02/2017 48h Holter: 37262 PVC's (41%).  . Sleep apnea    cannot tolerate CPAP  . Spinal stenosis of lumbar region 06/19/2015  . Status post gastric banding   . 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 Tinnitus    comes and goes   . Unilateral primary osteoarthritis, right knee 12/13/2015  . Vertigo    none for over 2 yrs    Allergies  Allergen Reactions  . Hydrocodone-Acetaminophen Swelling    hands  . Iodine Anaphylaxis and Swelling    Iv dye  . Other Other (See Comments)    ALLERGY TO METAL - BLACKENS SKIN AND CAUSES A RASH   . Tape Swelling and Other (See Comments)    Skin comes off.  Paper tape is ok tegaderm OK  . Peanut Oil Other (See Comments)    ** Nuts cause runny nose  . Shellfish Allergy Nausea And Vomiting and Swelling    Episode of GI infection after eating clam chowder. Still eats shrimp and other seafood    Objective: Patient appears well..  They are in no apparent distress.  Breathing is non labored.  Mood and behavior are normal.  Laboratory Data:  Recent Results (from the  past 2160 hour(s))  SARS CORONAVIRUS 2 (TAT 6-24 HRS) Nasopharyngeal Nasopharyngeal Swab     Status: None   Collection Time: 10/26/19 11:34 AM   Specimen: Nasopharyngeal Swab  Result Value Ref Range   SARS Coronavirus 2 NEGATIVE NEGATIVE    Comment: (NOTE) SARS-CoV-2 target nucleic acids are NOT DETECTED.  The SARS-CoV-2 RNA is generally detectable in upper and lower respiratory specimens during the acute phase of infection. Negative results do not preclude SARS-CoV-2 infection, do not rule out co-infections with other pathogens, and should not be used as the sole basis for treatment or  other patient management decisions. Negative results must be combined with clinical observations, patient history, and epidemiological information. The expected result is Negative.  Fact Sheet for Patients: SugarRoll.be  Fact Sheet for Healthcare Providers: https://www.woods-mathews.com/  This test is not yet approved or cleared by the [place] FDA and  has been authorized for detection and/or diagnosis of SARS-CoV-2 by FDA under an Emergency Use Authorization (EUA). This EUA will remain  in effect (meaning this test can be used) for the duration of the COVID-19 declaration under Se ction 564(b)(1) of the Act, 21 U.S.C. section 360bbb-3(b)(1), unless the authorization is terminated or revoked sooner.  Performed at [facility], [place] [address] [place], [place] [address]   Surgical pathology     Status: None   Collection Time: 10/30/19 10:58 AM  Result Value Ref Range   SURGICAL PATHOLOGY      SURGICAL PATHOLOGY CASE: [id number] PATIENT: [facility] Surgical Pathology Report     Specimen Submitted: A. Esophagus, distal; cbx B. Esophagus, proximal; cbx C. Colon polyp, ascending; cold snare D. Colon polyp x2, descending; cold snare E. Colon polyp, sigmoid; cold snare  Clinical History: Dysphagia, history of adenomatous polyp. Upper normal, colon polyps, diverticulosis.      DIAGNOSIS: A. ESOPHAGUS, DISTAL; COLD BIOPSY: - SQUAMOUS MUCOSA WITH NO SIGNIFICANT PATHOLOGIC ALTERATION. - NEGATIVE FOR INCREASED EOSINOPHILS. - NEGATIVE FOR INTESTINAL METAPLASIA, DYSPLASIA, AND MALIGNANCY.  B. ESOPHAGUS, PROXIMAL; COLD BIOPSY: - SQUAMOUS MUCOSA WITH NO SIGNIFICANT PATHOLOGIC ALTERATION. - NEGATIVE FOR INCREASED EOSINOPHILS. - NEGATIVE FOR INTESTINAL METAPLASIA, DYSPLASIA, AND MALIGNANCY.  C. COLON POLYP, ASCENDING; COLD SNARE: - TUBULAR ADENOMA. - NEGATIVE FOR HIGH-GRADE DYSPLASIA AND MALIGNANCY.  D. COLON POLYP  X2, DESCENDING; COLD SNARE: - TU BULAR ADENOMA, TWO FRAGMENTS. - NEGATIVE FOR HIGH-GRADE DYSPLASIA AND MALIGNANCY.  E. COLON POLYP SIGMOID; COLD SNARE: - HYPERPLASTIC POLYP. - NEGATIVE FOR DYSPLASIA AND MALIGNANCY.   GROSS DESCRIPTION: A. Labeled: Cold biopsy distal esophagus rule out EOE Received: In formalin Tissue fragment(s): 3 Size: 1 x 0.3 x 0.1 cm Description: Aggregate of tan tissue fragments Entirely submitted in 1 cassette.  B. Labeled: Cold biopsy proximal esophagus to rule out EOE Received: In formalin Tissue fragment(s): 3 Size: 0.5 x 0.2 x 0.1 cm Description: Aggregate of [place] tissue fragments Entirely submitted in 1 cassette.  C. Labeled: Cold snare polyp ascending colon Received: In formalin Tissue fragment(s): 1 Size: 0.8 x 0.5 x 0.2 cm Description: Tan polypoid fragment, trisected Entirely submitted in 1 cassette.  D. Labeled: Cold snare polyp x2 descending colon Received: In formalin Tissue fragment(s): Multiple Size: 2.5 x 0.6 x 0.2 cm Description: Aggregate of ta n tissue fragments admixed with fecal matter Entirely submitted in 1 cassette.  E. Labeled: Cold snare polyp sigmoid colon Received: In formalin Tissue fragment(s): Multiple Size: 0.7 x 0.3 x 0.1 cm Description: Aggregate of red polypoid fragments admixed with fecal matter Entirely submitted in 1  cassette.     Final Diagnosis performed by Betsy Pries, MD.   Electronically signed 10/31/2019 10:44:46AM The electronic signature indicates that the named Attending Pathologist has evaluated the specimen Technical component performed at [facility], [address], [place], [place] [address] Lab: [phone] Dir: Rush Farmer, MD, MMM  Professional component performed at [facility], [facility], [place], [place], [place] [address] Lab: [phone] Dir: Dellia Nims. Rubinas, MD   SARS CORONAVIRUS 2 (TAT 6-24 HRS) Nasopharyngeal Nasopharyngeal Swab     Status:  Abnormal   Collection Time: 01/19/20  2:15 PM   Specimen: Nasopharyngeal Swab  Result Value Ref Range   SARS Coronavirus 2 POSITIVE (A) NEGATIVE    Comment: (NOTE) SARS-CoV-2 target nucleic acids are DETECTED.  The SARS-CoV-2 RNA is generally detectable in upper and lower respiratory specimens during the acute phase of infection. Positive results are indicative of the presence of SARS-CoV-2 RNA. Clinical correlation with patient history and other diagnostic information is  necessary to determine patient infection status. Positive results do not rule out bacterial infection or co-infection with other viruses.  The expected result is Negative.  Fact Sheet for Patients: SugarRoll.be  Fact Sheet for Healthcare Providers: https://www.woods-mathews.com/  This test is not yet approved or cleared by the [place] FDA and  has been authorized for detection and/or diagnosis of SARS-CoV-2 by FDA under an Emergency Use Authorization (EUA). This EUA will remain  in effect (meaning this test can be used) for the duration of the COVID-19 declaration under Section 564(b)(1) of the Act, 21 U. S.C. section 360bbb-3(b)(1), unless the authorization is terminated or revoked sooner.   Performed at [facility], [place] [address], [place], [place] [address]      Assessment: 70 y.o. female with mild/moderate COVID 19 viral infection diagnosed on 01/19/20 at high risk for progression to severe COVID 19.  Plan:  This patient is a 70 y.o. female that meets the following criteria for Emergency Use Authorization of: Paxlovid 1. Age >12 yr AND > 40 kg 2. SARS-COV-2 positive test 3. Symptom onset < 5 days 4. Mild-to-moderate COVID disease with high risk for severe progression to hospitalization or death  I have spoken and communicated the following to the patient or parent/caregiver regarding: 1. Paxlovid is an unapproved drug that is authorized for use  under an Emergency Use Authorization.  2. There are no adequate, approved, available products for the treatment of COVID-19 in adults who have mild-to-moderate COVID-19 and are at high risk for progressing to severe COVID-19, including hospitalization or death. 3. Other therapeutics are currently authorized. For additional information on all products authorized for treatment or prevention of COVID-19, please see TanEmporium.pl.  4. There are benefits and risks of taking this treatment as outlined in the "Fact Sheet for Patients and Caregivers."  5. "Fact Sheet for Patients and Caregivers" was reviewed with patient. A hard copy will be provided to patient from pharmacy prior to the patient receiving treatment. 6. Patients should continue to self-isolate and use infection control measures (e.g., wear mask, isolate, social distance, avoid sharing personal items, clean and disinfect "high touch" surfaces, and frequent handwashing) according to CDC guidelines.  7. The patient or parent/caregiver has the option to accept or refuse treatment. 8. Patient medication history was reviewed for potential drug interactions:Interaction with home meds: eliquis- Patient must reduce eliquis by 50% while taking paxlovid- reduce to 2.5mg  BID from 5 mg BID 9. Patient's creatinine clearance was calculated to be 77 , and they  were therefore prescribed Normal dose (CrCl>60) - nirmatrelvir 150mg  tab (2 tablet) by mouth twice daily AND ritonavir 100mg  tab (1 tablet) by mouth twice daily   After reviewing above information with the patient, the patient agrees to receive Paxlovid.  Follow up instructions:    . Take prescription BID x 5 days as directed . Reach out to pharmacist for counseling on medication if desired . For concerns regarding further COVID symptoms please follow up with your PCP or urgent care . For  urgent or life-threatening issues, seek care at your local emergency department  The patient was provided an opportunity to ask questions, and all were answered. The patient agreed with the plan and demonstrated an understanding of the instructions.   Script sent to [facility] and opted to pick up RX.  The patient was advised to call their PCP or seek an in-person evaluation if the symptoms worsen or if the condition fails to improve as anticipated.   I provided15 minutes of non face-to-face telephone visit time during this encounter, and > 50% was spent counseling as documented under my assessment & plan.  Jacquelin Hawking, NP 01/21/2020 /12:12 PM

## 2020-01-22 ENCOUNTER — Telehealth: Payer: Self-pay

## 2020-01-22 NOTE — Telephone Encounter (Signed)
Patient was prescribed oral covid treatment Paxlovid and treatment note was reviewed. Medication has been received by [place] and reviewed for appropriateness.  Drug Interactions or Dosage Adjustments Noted: Patient counseled to decrease Eliquis dose by 50% to 2.5mg  bid while taking Paxlovid.   Delivery Method: Pick-up  Patient contacted for counseling on 01/22/20 and verbalized understanding.   Delivery or Pick-Up Date: 01/22/20   Carolynne Edouard 01/22/2020, 10:35 AM [facility] Outpatient Pharmacist Phone# [phone]

## 2020-03-05 ENCOUNTER — Other Ambulatory Visit: Payer: Self-pay

## 2020-03-05 ENCOUNTER — Encounter: Payer: Self-pay | Admitting: Anesthesiology

## 2020-03-05 ENCOUNTER — Ambulatory Visit: Payer: Medicare Other | Attending: Anesthesiology | Admitting: Anesthesiology

## 2020-03-05 DIAGNOSIS — M1712 Unilateral primary osteoarthritis, left knee: Secondary | ICD-10-CM

## 2020-03-05 DIAGNOSIS — M5442 Lumbago with sciatica, left side: Secondary | ICD-10-CM | POA: Diagnosis not present

## 2020-03-05 DIAGNOSIS — G8929 Other chronic pain: Secondary | ICD-10-CM

## 2020-03-05 DIAGNOSIS — M5441 Lumbago with sciatica, right side: Secondary | ICD-10-CM

## 2020-03-05 DIAGNOSIS — F1190 Opioid use, unspecified, uncomplicated: Secondary | ICD-10-CM

## 2020-03-05 DIAGNOSIS — M25552 Pain in left hip: Secondary | ICD-10-CM

## 2020-03-05 DIAGNOSIS — M48062 Spinal stenosis, lumbar region with neurogenic claudication: Secondary | ICD-10-CM

## 2020-03-05 DIAGNOSIS — M25551 Pain in right hip: Secondary | ICD-10-CM

## 2020-03-05 DIAGNOSIS — M5136 Other intervertebral disc degeneration, lumbar region: Secondary | ICD-10-CM

## 2020-03-05 DIAGNOSIS — M19011 Primary osteoarthritis, right shoulder: Secondary | ICD-10-CM

## 2020-03-05 DIAGNOSIS — G894 Chronic pain syndrome: Secondary | ICD-10-CM | POA: Diagnosis not present

## 2020-03-05 DIAGNOSIS — M51369 Other intervertebral disc degeneration, lumbar region without mention of lumbar back pain or lower extremity pain: Secondary | ICD-10-CM

## 2020-03-05 DIAGNOSIS — M1711 Unilateral primary osteoarthritis, right knee: Secondary | ICD-10-CM

## 2020-03-05 MED ORDER — OXYCODONE-ACETAMINOPHEN 10-325 MG PO TABS
1.0000 | ORAL_TABLET | ORAL | 0 refills | Status: DC | PRN
Start: 2020-04-11 — End: 2020-05-01

## 2020-03-05 MED ORDER — OXYCODONE-ACETAMINOPHEN 10-325 MG PO TABS
1.0000 | ORAL_TABLET | ORAL | 0 refills | Status: DC | PRN
Start: 2020-03-12 — End: 2020-04-11

## 2020-03-05 MED ORDER — GABAPENTIN 300 MG PO CAPS
600.0000 mg | ORAL_CAPSULE | ORAL | 2 refills | Status: DC
Start: 2020-03-05 — End: 2020-05-01

## 2020-03-05 MED ORDER — CYCLOBENZAPRINE HCL 10 MG PO TABS
10.0000 mg | ORAL_TABLET | Freq: Every day | ORAL | 2 refills | Status: AC
Start: 2020-03-05 — End: 2020-04-04

## 2020-03-05 NOTE — Progress Notes (Signed)
Virtual Visit via Telephone Note  I connected with Wendy Sanchez on 03/05/20 at  1:40 PM EST by telephone and verified that I am speaking with the correct person using two identifiers.  Location: Patient: Home Provider: Pain control center   I discussed the limitations, risks, security and privacy concerns of performing an evaluation and management service by telephone and the availability of in person appointments. I also discussed with the patient that there may be a patient responsible charge related to this service. The patient expressed understanding and agreed to proceed.   History of Present Illness: I spoke with Wendy Sanchez today via telephone as she was unable to do the video portion of the virtual conference.  She reports that she is having a lot of pain especially in the distal fingers hands shoulders hips knees particular left knee and low back.  She continues take her medications but continues to have breakthrough during parts of the day as she gets approximately 60 to 70% relief with the medication however it is lasting 3 to 4 hours at best.  In the past she reports that she has been on 5 times a day for her opioid medications and this is worked better for her.  Unfortunately she cannot take her anti-inflammatories at this stage secondary to her blood thinners.  She is currently under evaluation with her rheumatologist to help with the diffuse rheumatologic symptoms that she has experienced.  Otherwise no change in strength or bowel or bladder function is noted at this time and she continues to tolerate the opioid medications without difficulty and unfortunately has failed more conservative therapy.  No sedation or other side effects are noted with the medicines.   Observations/Objective:  Current Outpatient Medications:  .  cyclobenzaprine (FLEXERIL) 10 MG tablet, Take 1 tablet (10 mg total) by mouth at bedtime., Disp: 30 tablet, Rfl: 2 .  [START ON 04/11/2020]  oxyCODONE-acetaminophen (PERCOCET) 10-325 MG tablet, Take 1 tablet by mouth every 4 (four) hours as needed for pain., Disp: 150 tablet, Rfl: 0 .  albuterol (PROVENTIL HFA;VENTOLIN HFA) 108 (90 Base) MCG/ACT inhaler, Inhale 2 puffs into the lungs every 6 (six) hours as needed for wheezing or shortness of breath., Disp: , Rfl:  .  albuterol (PROVENTIL) (2.5 MG/3ML) 0.083% nebulizer solution, Take 3 mLs (2.5 mg total) by nebulization every 6 (six) hours as needed for wheezing or shortness of breath., Disp: 75 mL, Rfl: 0 .  apixaban (ELIQUIS) 5 MG TABS tablet, Take 1 tablet (5 mg total) by mouth 2 (two) times daily., Disp: 60 tablet, Rfl: 0 .  Ascorbic Acid (VITAMIN C PO), Take 2 tablets by mouth daily., Disp: , Rfl:  .  aspirin EC 81 MG tablet, Take 1 tablet (81 mg total) daily by mouth. (Patient not taking: Reported on 10/30/2019), Disp: 90 tablet, Rfl: 3 .  azelastine (ASTELIN) 0.1 % nasal spray, Place 1 spray into both nostrils 2 (two) times daily as needed for allergies. Use in each nostril as directed, Disp: , Rfl:  .  carvedilol (COREG) 6.25 MG tablet, Take 1 tablet (6.25 mg total) by mouth 2 (two) times daily., Disp: 180 tablet, Rfl: 3 .  Dextromethorphan-guaiFENesin (CORICIDIN HBP CONGESTION/COUGH) 10-200 MG CAPS, Take 2 tablets by mouth 2 (two) times daily as needed (cough / congestion)., Disp: , Rfl:  .  diclofenac sodium (VOLTAREN) 1 % GEL, Apply 2 g topically 4 (four) times daily as needed (pain). , Disp: , Rfl:  .  diphenhydrAMINE (BENADRYL) 25 mg capsule,  Take 50 mg by mouth every 6 (six) hours as needed for allergies. , Disp: , Rfl:  .  docusate sodium (COLACE) 100 MG capsule, Take 100 mg by mouth at bedtime as needed for mild constipation. , Disp: , Rfl:  .  ELDERBERRY PO, Take 100 mg by mouth daily. , Disp: , Rfl:  .  fluconazole (DIFLUCAN) 100 MG tablet, Take 100 mg by mouth daily as needed (yeast infections)., Disp: , Rfl:  .  folic acid (FOLVITE) 1 MG tablet, Take 1 mg by mouth at  bedtime. , Disp: , Rfl:  .  furosemide (LASIX) 40 MG tablet, Take 2 tablets (80 mg) by mouth once daily as directed (Patient taking differently: Take 80 mg by mouth daily. ), Disp: 180 tablet, Rfl: 3 .  gabapentin (NEURONTIN) 300 MG capsule, Take 2 capsules (600 mg total) by mouth See admin instructions. Take 600 mg by mouth twice daily, may take a third 600 mg dose as needed for pain, Disp: 180 capsule, Rfl: 2 .  gentamicin ointment (GARAMYCIN) 0.1 %, Apply 1 application topically 5 (five) times daily as needed (boil). , Disp: , Rfl:  .  Guaifenesin 1200 MG TB12, Take 1,200 mg by mouth 2 (two) times daily as needed (congestion)., Disp: , Rfl:  .  ipratropium-albuterol (DUONEB) 0.5-2.5 (3) MG/3ML SOLN, Take 3 mLs by nebulization every 6 (six) hours as needed (shortness of breath)., Disp: , Rfl:  .  levocetirizine (XYZAL) 5 MG tablet, Take 5 mg by mouth at bedtime. , Disp: , Rfl:  .  mirabegron ER (MYRBETRIQ) 50 MG TB24 tablet, Take 50 mg by mouth daily., Disp: , Rfl:  .  montelukast (SINGULAIR) 10 MG tablet, Take 10 mg by mouth at bedtime., Disp: , Rfl:  .  Multiple Vitamins-Minerals (MULTIVITAMIN GUMMIES ADULTS) CHEW, Chew 2 tablets by mouth daily. , Disp: , Rfl:  .  nortriptyline (PAMELOR) 50 MG capsule, Take 50 mg by mouth at bedtime., Disp: , Rfl:  .  nystatin (MYCOSTATIN) 100000 UNIT/ML suspension, Take 5 mLs by mouth 4 (four) times daily as needed (yeast). , Disp: , Rfl:  .  nystatin cream (MYCOSTATIN), Apply 1 application topically 2 (two) times daily as needed (yeast)., Disp: , Rfl:  .  ondansetron (ZOFRAN) 4 MG tablet, Take 1 tablet (4 mg total) by mouth every 8 (eight) hours as needed for up to 10 doses for nausea or vomiting., Disp: 20 tablet, Rfl: 0 .  [START ON 03/12/2020] oxyCODONE-acetaminophen (PERCOCET) 10-325 MG tablet, Take 1 tablet by mouth every 4 (four) hours as needed for pain., Disp: 150 tablet, Rfl: 0 .  pantoprazole (PROTONIX) 40 MG tablet, Take 40 mg by mouth 2 (two) times  daily., Disp: , Rfl:  .  pilocarpine (SALAGEN) 5 MG tablet, Take 5 mg by mouth 2 (two) times daily. , Disp: , Rfl:  .  Polyvinyl Alcohol-Povidone (REFRESH OP), Place 1 drop into both eyes daily as needed (dry eyes)., Disp: , Rfl:  .  potassium chloride SA (K-DUR,KLOR-CON) 20 MEQ tablet, Take 20 mEq by mouth 3 days. , Disp: , Rfl:  .  predniSONE (DELTASONE) 20 MG tablet, Take 20 mg by mouth daily. 3 tabs daily at night, Disp: , Rfl:  .  predniSONE (DELTASONE) 50 MG tablet, One a day, Disp: 5 tablet, Rfl: 0 .  Probiotic Product (PROBIOTIC COLON SUPPORT) CAPS, Take 1 capsule by mouth at bedtime., Disp: , Rfl:  .  sacubitril-valsartan (ENTRESTO) 49-51 MG, Take 1 tablet by mouth 2 (two) times daily.,  Disp: , Rfl:  .  sucralfate (CARAFATE) 1 G tablet, Take 1 g by mouth 2 (two) times daily before a meal., Disp: , Rfl:  .  umeclidinium-vilanterol (ANORO ELLIPTA) 62.5-25 MCG/INH AEPB, Inhale 1 puff into the lungs daily., Disp: , Rfl:  .  Vitamin D, Ergocalciferol, (DRISDOL) 50000 UNITS CAPS capsule, Take 50,000 Units by mouth every Thursday. , Disp: , Rfl:  .  Xylitol (XYLIMELTS) 550 MG DISK, Use as directed 550 mg in the mouth or throat as needed. , Disp: , Rfl:   Assessment and Plan: 1. DDD (degenerative disc disease), lumbar   2. Chronic bilateral low back pain with bilateral sciatica   3. Chronic, continuous use of opioids   4. Chronic pain syndrome   5. Hip pain, bilateral   6. Chronic pain of right hip   7. Primary osteoarthritis of right shoulder   8. Spinal stenosis of lumbar region with neurogenic claudication   9. Unilateral primary osteoarthritis, right knee   10. Osteoarthritis of left knee, unspecified osteoarthritis type   Based on our discussion today and after review of the [facility] practitioner database information going to refill her medicines for the next 2 months.  We will make this for her Percocet to be taken up to 5 times a day on a every 4 hour basis 1 tablet.  Should she  have any problems with the medication she is instructed to contact us.  She is also instructed to continue follow-up with her primary care physicians and rheumatologist.  No other changes will be initiated in her pain management protocol at this point with return to clinic scheduled as mentioned.  I encouraged her to continue efforts at weight loss stretching strengthening as reviewed today.  Follow Up Instructions:    I discussed the assessment and treatment plan with the patient. The patient was provided an opportunity to ask questions and all were answered. The patient agreed with the plan and demonstrated an understanding of the instructions.   The patient was advised to call back or seek an in-person evaluation if the symptoms worsen or if the condition fails to improve as anticipated.  I provided 30 minutes of non-face-to-face time during this encounter.   Molli Barrows, MD

## 2020-03-22 DIAGNOSIS — I5020 Unspecified systolic (congestive) heart failure: Secondary | ICD-10-CM | POA: Diagnosis present

## 2020-04-11 ENCOUNTER — Ambulatory Visit: Payer: Medicare Other | Admitting: Internal Medicine

## 2020-04-11 ENCOUNTER — Encounter: Payer: Self-pay | Admitting: Internal Medicine

## 2020-04-11 ENCOUNTER — Other Ambulatory Visit: Payer: Self-pay

## 2020-04-11 VITALS — BP 110/70 | HR 82 | Ht 67.0 in | Wt 307.2 lb

## 2020-04-11 DIAGNOSIS — I493 Ventricular premature depolarization: Secondary | ICD-10-CM | POA: Diagnosis not present

## 2020-04-11 DIAGNOSIS — I428 Other cardiomyopathies: Secondary | ICD-10-CM

## 2020-04-11 DIAGNOSIS — I10 Essential (primary) hypertension: Secondary | ICD-10-CM

## 2020-04-11 DIAGNOSIS — I5022 Chronic systolic (congestive) heart failure: Secondary | ICD-10-CM | POA: Diagnosis not present

## 2020-04-11 DIAGNOSIS — Z79899 Other long term (current) drug therapy: Secondary | ICD-10-CM

## 2020-04-11 MED ORDER — TORSEMIDE 20 MG PO TABS: ORAL_TABLET | ORAL | Status: DC

## 2020-04-11 NOTE — Progress Notes (Signed)
Patient Care Team: [name], MD as PCP - General (Family Medicine) Deboraha Sprang, MD as PCP - Electrophysiology (Cardiology) Yolonda Kida, MD as PCP - Cardiology (Cardiology)   HPI  Wendy Sanchez is a 70 y.o. 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  Initially seen in consultation 11/18   initially treated with amiodarone and subsequently ranolazine.  Then underwent catheter ablation 4/19 with a.  History and focus.  Still ablation was associate with a marked reduction in PVC burden.  Unfortunately, there was no significant interval improvement in LVEF.    7/20  pulmonary embolism treated with TPA and thrombectomy.  Chronic dyspnea.  Stable.  Peripheral edema is variable.  Diet is salt deplete but fluid replete.   Followed primarily by Dr. Clayborn Bigness  Intercurrently diagnosed with temporal arteritis on prednisone dyspnea.    DATE TEST EF   6/18 Echo  30 % LVE/LAE  8/18 Cath   25 % Normal CAs  8/19 Echo  20-25%    Date Cr K Mg Hgb   9/18 0.8 4.5   12.8  2/19 1.10 5.0  13.4  9/19 0.8 4.6    7/20 0.93 4.2  11.6  5/21 0.85 3.7  12.7   DATE Holter PVCs meds  11/18 41% +amio + ranolazine  2/19 17% +entresto  3/19 24% amio d/c   10/19 4%          Past Medical History:  Diagnosis Date  . Allergic rhinitis   . Arthritis   . Asthma    problems with fumes and aerosols cause asthma  . Chest pain 10/19/2016  . Complication of anesthesia    awakens during surgery; has occurred with last 3-4 surgeries   . COPD (chronic obstructive pulmonary disease) ([place])   . DVT (deep venous thrombosis) ([place]) 07/13/2018   Right leg  . Environmental allergies    fumes   . Fibromyalgia   . GERD (gastroesophageal reflux disease)   . Headache   . Hemorrhoids   . History of bronchitis   . Hx of total knee arthroplasty 12/13/2015  . Hypertension   . Morbid obesity with BMI of 45.0-49.9, adult ([place]) 07/25/2015  . NICM (nonischemic  cardiomyopathy) ([place])    a. ? PVC mediated;  b. 06/2016 Echo: EF 25-30%, mild LVH, mild LAE, mild AI/MR/TR/PR; c. 08/2016 Cath: nl cors, EF 25%.  . Numbness    hands bilat when driving; improves when not preforming task   . Osteoarthritis of left knee 08/02/2015  . PE (pulmonary thromboembolism) ([place]) 07/13/2018  . Pes anserinus bursitis of left knee 05/18/2016  . Presence of right artificial knee joint 05/18/2016  . PVC (premature ventricular contraction) 06/04/2017  . PVC's (premature ventricular contractions)    a. 11/2016 Amio started;  b. 11/29/16 48h Holter: 24097 PVC's (41%); c. 12/2016 24h Holter: 18040 PVC's (15%); c. 02/2017 48h Holter: 37262 PVC's (41%).  . Sleep apnea    cannot tolerate CPAP  . Spinal stenosis of lumbar region 06/19/2015  . Status post gastric banding   . 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 Tinnitus    comes and goes   . Unilateral primary osteoarthritis, right knee 12/13/2015  . Vertigo    none for over 2 yrs    Past Surgical History:  Procedure Laterality Date  . ABDOMINAL HYSTERECTOMY  1979   left ovary remains  . ABDOMINAL HYSTERECTOMY    . ARTERY BIOPSY Left 06/07/2019  Procedure: BIOPSY TEMPORAL ARTERY;  Surgeon: Algernon Huxley, MD;  Location: [facility] ORS;  Service: Vascular;  Laterality: Left;  . CHOLECYSTECTOMY    . COLONOSCOPY    . COLONOSCOPY N/A 10/30/2019   Procedure: COLONOSCOPY;  Surgeon: Lesly Rubenstein, MD;  Location: [facility] ENDOSCOPY;  Service: Endoscopy;  Laterality: N/A;  . ESOPHAGOGASTRODUODENOSCOPY N/A 10/30/2019   Procedure: ESOPHAGOGASTRODUODENOSCOPY (EGD);  Surgeon: Lesly Rubenstein, MD;  Location: [facility];  Service: Endoscopy;  Laterality: N/A;  . fibrous tissue removed from right shoulder and back of neck      2 years ago   . FLOOR OF MOUTH BIOPSY N/A 02/28/2019   Procedure: SALIVARY GLAND BIOPSY;  Surgeon: Carloyn Manner, MD;  Location: [facility] ORS;  Service: ENT;  Laterality: N/A;  . FRACTURE SURGERY     left  small finger  . GANGLION CYST EXCISION    . GASTRIC BYPASS  1980   states had bypass with banding and band left in -  . GASTRIC BYPASS OPEN    . JOINT REPLACEMENT Bilateral 2017   knees  . KNEE SURGERY Bilateral    both knees  2001  . NECK SURGERY N/A 1205/19   ganglion cyst removal, [place]   . PULMONARY THROMBECTOMY Bilateral 07/14/2018   Procedure: PULMONARY THROMBECTOMY;  Surgeon: Algernon Huxley, MD;  Location: [place] CV LAB;  Service: Cardiovascular;  Laterality: Bilateral;  . PVC ABLATION N/A 06/04/2017   Procedure: PVC ABLATION;  Surgeon: Evans Lance, MD;  Location: [place] [facility] LAB;  Service: Cardiovascular;  Laterality: N/A;  . RIGHT/LEFT HEART CATH AND CORONARY ANGIOGRAPHY Bilateral 08/12/2016   Procedure: Right/Left Heart Cath and Coronary Angiography;  Surgeon: Yolonda Kida, MD;  Location: [place] [facility];  Service: Cardiovascular;  Laterality: Bilateral;  . TONSILLECTOMY     age 2  . TOTAL KNEE ARTHROPLASTY Left 08/02/2015   Procedure: LEFT TOTAL KNEE ARTHROPLASTY;  Surgeon: Mcarthur Rossetti, MD;  Location: [facility] ORS;  Service: Orthopedics;  Laterality: Left;  . TOTAL KNEE ARTHROPLASTY Right 12/13/2015   Procedure: RIGHT TOTAL KNEE ARTHROPLASTY;  Surgeon: Mcarthur Rossetti, MD;  Location: [facility] ORS;  Service: Orthopedics;  Laterality: Right;    Current Outpatient Medications  Medication Sig Dispense Refill  . albuterol (PROVENTIL HFA;VENTOLIN HFA) 108 (90 Base) MCG/ACT inhaler Inhale 2 puffs into the lungs every 6 (six) hours as needed for wheezing or shortness of breath.    Marland Kitchen albuterol (PROVENTIL) (2.5 MG/3ML) 0.083% nebulizer solution Take 3 mLs (2.5 mg total) by nebulization every 6 (six) hours as needed for wheezing or shortness of breath. 75 mL 0  . amoxicillin (AMOXIL) 500 MG tablet Take 1,000 mg by mouth 2 (two) times daily.    . Ascorbic Acid (VITAMIN C PO) Take 2 tablets by mouth daily.    Marland Kitchen azelastine (ASTELIN) 0.1 % nasal spray Place 1 spray  into both nostrils 2 (two) times daily as needed for allergies. Use in each nostril as directed    . carvedilol (COREG) 6.25 MG tablet Take 1 tablet (6.25 mg total) by mouth 2 (two) times daily. 180 tablet 3  . clarithromycin (BIAXIN) 500 MG tablet Take 1,000 mg by mouth daily.    . diclofenac sodium (VOLTAREN) 1 % GEL Apply 2 g topically 4 (four) times daily as needed (pain).     Marland Kitchen diphenhydrAMINE (BENADRYL) 25 mg capsule Take 50 mg by mouth every 6 (six) hours as needed for allergies.     Marland Kitchen docusate sodium (COLACE) 100 MG  capsule Take 100 mg by mouth at bedtime as needed for mild constipation.     Marland Kitchen ELDERBERRY PO Take 100 mg by mouth daily.     . fluconazole (DIFLUCAN) 100 MG tablet Take 100 mg by mouth daily as needed (yeast infections).    . folic acid (FOLVITE) 1 MG tablet Take 1 mg by mouth at bedtime.     . furosemide (LASIX) 40 MG tablet Take 2 tablets (80 mg) by mouth once daily as directed 180 tablet 3  . gabapentin (NEURONTIN) 300 MG capsule Take 2 capsules (600 mg total) by mouth See admin instructions. Take 600 mg by mouth twice daily, may take a third 600 mg dose as needed for pain 180 capsule 2  . Guaifenesin 1200 MG TB12 Take 1,200 mg by mouth 2 (two) times daily as needed (congestion).    Marland Kitchen ipratropium-albuterol (DUONEB) 0.5-2.5 (3) MG/3ML SOLN Take 3 mLs by nebulization every 6 (six) hours as needed (shortness of breath).    Marland Kitchen levocetirizine (XYZAL) 5 MG tablet Take 5 mg by mouth at bedtime.     . mirabegron ER (MYRBETRIQ) 50 MG TB24 tablet Take 50 mg by mouth daily.    . montelukast (SINGULAIR) 10 MG tablet Take 10 mg by mouth at bedtime.    . Multiple Vitamins-Minerals (MULTIVITAMIN GUMMIES ADULTS) CHEW Chew 2 tablets by mouth daily.     . nortriptyline (PAMELOR) 50 MG capsule Take 50 mg by mouth at bedtime.    Marland Kitchen nystatin (MYCOSTATIN) 100000 UNIT/ML suspension Take 5 mLs by mouth 4 (four) times daily as needed (yeast).     . nystatin cream (MYCOSTATIN) Apply 1 application  topically 2 (two) times daily as needed (yeast).    Marland Kitchen omeprazole (PRILOSEC) 20 MG capsule Take 20 mg by mouth in the morning and at bedtime.    Marland Kitchen oxyCODONE-acetaminophen (PERCOCET) 10-325 MG tablet Take 1 tablet by mouth every 4 (four) hours as needed for pain. 150 tablet 0  . pantoprazole (PROTONIX) 40 MG tablet Take 40 mg by mouth 2 (two) times daily.    . Polyvinyl Alcohol-Povidone (REFRESH OP) Place 1 drop into both eyes daily as needed (dry eyes).    . potassium chloride SA (K-DUR,KLOR-CON) 20 MEQ tablet Take 20 mEq by mouth 3 days.     . predniSONE (DELTASONE) 1 MG tablet Take 3 mg by mouth daily.    . Probiotic Product (PROBIOTIC COLON SUPPORT) CAPS Take 1 capsule by mouth at bedtime.    . sacubitril-valsartan (ENTRESTO) 49-51 MG Take 1 tablet by mouth 2 (two) times daily.    . sucralfate (CARAFATE) 1 G tablet Take 1 g by mouth 2 (two) times daily before a meal.    . umeclidinium-vilanterol (ANORO ELLIPTA) 62.5-25 MCG/INH AEPB Inhale 1 puff into the lungs daily.    . Vitamin D, Ergocalciferol, (DRISDOL) 50000 UNITS CAPS capsule Take 50,000 Units by mouth every Thursday.     Marland Kitchen apixaban (ELIQUIS) 5 MG TABS tablet Take 1 tablet (5 mg total) by mouth 2 (two) times daily. 60 tablet 0  . Dextromethorphan-guaiFENesin (CORICIDIN HBP CONGESTION/COUGH) 10-200 MG CAPS Take 2 tablets by mouth 2 (two) times daily as needed (cough / congestion). (Patient not taking: Reported on 04/11/2020)     No current facility-administered medications for this visit.    Allergies  Allergen Reactions  . Hydrocodone-Acetaminophen Swelling    hands  . Iodine Anaphylaxis and Swelling    Iv dye  . Other Other (See Comments)    ALLERGY TO  METAL - BLACKENS SKIN AND CAUSES A RASH   . Tape Swelling and Other (See Comments)    Skin comes off.  Paper tape is ok tegaderm OK  . Peanut Oil Other (See Comments)    ** Nuts cause runny nose  . Shellfish Allergy Nausea And Vomiting and Swelling    Episode of GI infection  after eating clam chowder. Still eats shrimp and other seafood      Review of Systems negative except from HPI and PMH  Physical Exam BP 110/70   Pulse 82   Ht 5\' 7"  (1.702 m)   Wt (!) 307 lb 3.2 oz (139.3 kg)   SpO2 98%   BMI 48.11 kg/m  Well developed Morbidly obese in no acute distress HENT normal Neck supple   Clear Regular rate and rhythm, no murmurs or gallops Abd-soft with active BS No Clubbing cyanosis edema Skin-warm and dry A & Oriented  Grossly normal sensory and motor function  ECG sinus @ 82  without ectopy    Assessment and Plan:  PVCs-- s/p ablation largely extinguished    Nonischemic cardiomyopathy persisting post ablation  Right leg DVT/bilateral pulmonary embolism Rx w thrombolysis and thrombectomy  Morbidly obese  Chronic systolic heart failure    PVCs are largely quiescient at this point.  We will have her follow-up with [place] as needed.  Discussed with her the potential benefits of SGLT2 and aldosterone antagonist therapy.  She will review this with Dr. Billey Co.  No bleeding on anticoagulation.  Current medicines are reviewed at length with the patient today .  The patient does not have concerns regarding medicines.

## 2020-04-11 NOTE — Patient Instructions (Addendum)
Medication Instructions:  - Your physician has recommended you make the following change in your medication:   1) STOP lasix (furosemide)  2) START demadex (torsemide) 20 mg- take 2 tablets (40 mg) by mouth once daily   (Dr. Olin Pia nurse will send this prescription into the [facility] in [place] on 04/12/20)  *If you need a refill on your cardiac medications before your next appointment, please call your pharmacy*   Lab Work: - Your physician recommends that you have lab work today: BMP/ CBC  If you have labs (blood work) drawn today and your tests are completely normal, you will receive your results only by: Marland Kitchen MyChart Message (if you have MyChart) OR . A paper copy in the mail If you have any lab test that is abnormal or we need to change your treatment, we will call you to review the results.   Testing/Procedures: - none ordered   Follow-Up: At [facility], you and your health needs are our priority.  As part of our continuing mission to provide you with exceptional heart care, we have created designated Provider Care Teams.  These Care Teams include your primary Cardiologist (physician) and Advanced Practice Providers (APPs -  Physician Assistants and Nurse Practitioners) who all work together to provide you with the care you need, when you need it.  We recommend signing up for the patient portal called "MyChart".  Sign up information is provided on this After Visit Summary.  MyChart is used to connect with patients for Virtual Visits (Telemedicine).  Patients are able to view lab/test results, encounter notes, upcoming appointments, etc.  Non-urgent messages can be sent to your provider as well.   To learn more about what you can do with MyChart, go to [email or web address].    Your next appointment:   As needed   The format for your next appointment:   In Person  Provider:   Dr. Caryl Comes   Other Instructions

## 2020-04-12 ENCOUNTER — Other Ambulatory Visit: Payer: Self-pay | Admitting: *Deleted

## 2020-04-12 LAB — CBC WITH DIFFERENTIAL/PLATELET
Basophils Absolute: 0 10*3/uL (ref 0.0–0.2)
Basos: 0 %
EOS (ABSOLUTE): 0.2 10*3/uL (ref 0.0–0.4)
Eos: 2 %
Hematocrit: 36.1 % (ref 34.0–46.6)
Hemoglobin: 11.7 g/dL (ref 11.1–15.9)
Immature Grans (Abs): 0.1 10*3/uL (ref 0.0–0.1)
Immature Granulocytes: 1 %
Lymphocytes Absolute: 1 10*3/uL (ref 0.7–3.1)
Lymphs: 13 %
MCH: 26.5 pg — ABNORMAL LOW (ref 26.6–33.0)
MCHC: 32.4 g/dL (ref 31.5–35.7)
MCV: 82 fL (ref 79–97)
Monocytes Absolute: 0.3 10*3/uL (ref 0.1–0.9)
Monocytes: 3 %
Neutrophils Absolute: 6.4 10*3/uL (ref 1.4–7.0)
Neutrophils: 81 %
Platelets: 339 10*3/uL (ref 150–450)
RBC: 4.41 x10E6/uL (ref 3.77–5.28)
RDW: 14.8 % (ref 11.7–15.4)
WBC: 7.9 10*3/uL (ref 3.4–10.8)

## 2020-04-12 LAB — BASIC METABOLIC PANEL
BUN/Creatinine Ratio: 15 (ref 12–28)
BUN: 11 mg/dL (ref 8–27)
CO2: 24 mmol/L (ref 20–29)
Calcium: 9 mg/dL (ref 8.7–10.3)
Chloride: 102 mmol/L (ref 96–106)
Creatinine, Ser: 0.74 mg/dL (ref 0.57–1.00)
Glucose: 102 mg/dL — ABNORMAL HIGH (ref 65–99)
Potassium: 5.1 mmol/L (ref 3.5–5.2)
Sodium: 140 mmol/L (ref 134–144)
eGFR: 88 mL/min/{1.73_m2} (ref >59)

## 2020-04-12 MED ORDER — TORSEMIDE 20 MG PO TABS: ORAL_TABLET | ORAL | 3 refills | Status: DC

## 2020-04-18 ENCOUNTER — Encounter: Payer: Self-pay | Admitting: Emergency Medicine

## 2020-04-18 ENCOUNTER — Ambulatory Visit
Admission: EM | Admit: 2020-04-18 | Discharge: 2020-04-18 | Disposition: A | Discharge status: Home / Self Care | Payer: Medicare Other | Attending: Physician Assistant | Admitting: Physician Assistant

## 2020-04-18 ENCOUNTER — Other Ambulatory Visit: Payer: Self-pay

## 2020-04-18 ENCOUNTER — Ambulatory Visit (INDEPENDENT_AMBULATORY_CARE_PROVIDER_SITE_OTHER): Payer: Medicare Other

## 2020-04-18 DIAGNOSIS — Z79899 Other long term (current) drug therapy: Secondary | ICD-10-CM | POA: Diagnosis not present

## 2020-04-18 DIAGNOSIS — R0602 Shortness of breath: Secondary | ICD-10-CM | POA: Diagnosis not present

## 2020-04-18 DIAGNOSIS — R5383 Other fatigue: Secondary | ICD-10-CM

## 2020-04-18 DIAGNOSIS — R059 Cough, unspecified: Secondary | ICD-10-CM

## 2020-04-18 DIAGNOSIS — I509 Heart failure, unspecified: Secondary | ICD-10-CM | POA: Diagnosis not present

## 2020-04-18 DIAGNOSIS — R062 Wheezing: Secondary | ICD-10-CM

## 2020-04-18 DIAGNOSIS — J441 Chronic obstructive pulmonary disease with (acute) exacerbation: Secondary | ICD-10-CM

## 2020-04-18 DIAGNOSIS — R634 Abnormal weight loss: Secondary | ICD-10-CM | POA: Insufficient documentation

## 2020-04-18 DIAGNOSIS — M797 Fibromyalgia: Secondary | ICD-10-CM | POA: Insufficient documentation

## 2020-04-18 DIAGNOSIS — I110 Hypertensive heart disease with heart failure: Secondary | ICD-10-CM | POA: Diagnosis not present

## 2020-04-18 DIAGNOSIS — Z20822 Contact with and (suspected) exposure to covid-19: Secondary | ICD-10-CM | POA: Insufficient documentation

## 2020-04-18 DIAGNOSIS — Z8616 Personal history of COVID-19: Secondary | ICD-10-CM | POA: Insufficient documentation

## 2020-04-18 DIAGNOSIS — Z7901 Long term (current) use of anticoagulants: Secondary | ICD-10-CM | POA: Insufficient documentation

## 2020-04-18 DIAGNOSIS — I10 Essential (primary) hypertension: Secondary | ICD-10-CM | POA: Diagnosis not present

## 2020-04-18 DIAGNOSIS — Z87891 Personal history of nicotine dependence: Secondary | ICD-10-CM | POA: Insufficient documentation

## 2020-04-18 DIAGNOSIS — Z28310 Unvaccinated for covid-19: Secondary | ICD-10-CM | POA: Insufficient documentation

## 2020-04-18 DIAGNOSIS — Z8249 Family history of ischemic heart disease and other diseases of the circulatory system: Secondary | ICD-10-CM | POA: Diagnosis not present

## 2020-04-18 DIAGNOSIS — Z86718 Personal history of other venous thrombosis and embolism: Secondary | ICD-10-CM | POA: Insufficient documentation

## 2020-04-18 DIAGNOSIS — J029 Acute pharyngitis, unspecified: Secondary | ICD-10-CM | POA: Insufficient documentation

## 2020-04-18 DIAGNOSIS — Z6841 Body Mass Index (BMI) 40.0 and over, adult: Secondary | ICD-10-CM | POA: Insufficient documentation

## 2020-04-18 DIAGNOSIS — G8929 Other chronic pain: Secondary | ICD-10-CM | POA: Diagnosis not present

## 2020-04-18 LAB — CBC WITH DIFFERENTIAL/PLATELET
Abs Immature Granulocytes: 0.06 10*3/uL (ref 0.00–0.07)
Basophils Absolute: 0 10*3/uL (ref 0.0–0.1)
Basophils Relative: 0 %
Eosinophils Absolute: 0.1 10*3/uL (ref 0.0–0.5)
Eosinophils Relative: 1 %
HCT: 33.5 % — ABNORMAL LOW (ref 36.0–46.0)
Hemoglobin: 10.8 g/dL — ABNORMAL LOW (ref 12.0–15.0)
Immature Granulocytes: 1 %
Lymphocytes Relative: 8 %
Lymphs Abs: 0.8 10*3/uL (ref 0.7–4.0)
MCH: 26.9 pg (ref 26.0–34.0)
MCHC: 32.2 g/dL (ref 30.0–36.0)
MCV: 83.5 fL (ref 80.0–100.0)
Monocytes Absolute: 0.2 10*3/uL (ref 0.1–1.0)
Monocytes Relative: 2 %
Neutro Abs: 8.4 10*3/uL — ABNORMAL HIGH (ref 1.7–7.7)
Neutrophils Relative %: 88 %
Platelets: 395 10*3/uL (ref 150–400)
RBC: 4.01 MIL/uL (ref 3.87–5.11)
RDW: 16.5 % — ABNORMAL HIGH (ref 11.5–15.5)
WBC: 9.5 10*3/uL (ref 4.0–10.5)
nRBC: 0 % (ref 0.0–0.2)

## 2020-04-18 LAB — COMPREHENSIVE METABOLIC PANEL
ALT: 46 U/L — ABNORMAL HIGH (ref 0–44)
AST: 46 U/L — ABNORMAL HIGH (ref 15–41)
Albumin: 2.6 g/dL — ABNORMAL LOW (ref 3.5–5.0)
Alkaline Phosphatase: 142 U/L — ABNORMAL HIGH (ref 38–126)
Anion gap: 6 (ref 5–15)
BUN: 22 mg/dL (ref 8–23)
CO2: 32 mmol/L (ref 22–32)
Calcium: 8.6 mg/dL — ABNORMAL LOW (ref 8.9–10.3)
Chloride: 98 mmol/L (ref 98–111)
Creatinine, Ser: 1 mg/dL (ref 0.44–1.00)
GFR, Estimated: >60 mL/min (ref >60)
Glucose, Bld: 113 mg/dL — ABNORMAL HIGH (ref 70–99)
Potassium: 4.7 mmol/L (ref 3.5–5.1)
Sodium: 136 mmol/L (ref 135–145)
Total Bilirubin: 0.4 mg/dL (ref 0.3–1.2)
Total Protein: 6.9 g/dL (ref 6.5–8.1)

## 2020-04-18 LAB — URINALYSIS, COMPLETE (UACMP) WITH MICROSCOPIC
Glucose, UA: NEGATIVE mg/dL
Hgb urine dipstick: NEGATIVE
Ketones, ur: NEGATIVE mg/dL
Leukocytes,Ua: NEGATIVE
Nitrite: NEGATIVE
Protein, ur: 30 mg/dL — AB
RBC / HPF: NONE SEEN RBC/hpf (ref 0–5)
Specific Gravity, Urine: 1.02 (ref 1.005–1.030)
pH: 6 (ref 5.0–8.0)

## 2020-04-18 LAB — RESP PANEL BY RT-PCR (FLU A&B, COVID) ARPGX2
Influenza A by PCR: NEGATIVE
Influenza B by PCR: NEGATIVE
SARS Coronavirus 2 by RT PCR: NEGATIVE

## 2020-04-18 MED ORDER — PREDNISONE 20 MG PO TABS: 20.0000 mg | ORAL_TABLET | Freq: Every day | ORAL | 0 refills | Status: AC

## 2020-04-18 MED ORDER — DOXYCYCLINE HYCLATE 100 MG PO CAPS: 100.0000 mg | ORAL_CAPSULE | Freq: Two times a day (BID) | ORAL | 0 refills | Status: AC

## 2020-04-18 NOTE — Discharge Instructions (Addendum)
I will call you with the results of your flu and Covid test that not back yet.  I do believe your symptoms are consistent with a viral illness versus possible allergies.  Your chest x-ray was normal but you do have a lot of wheezing.  This is likely due to a COPD exacerbation.  Take prednisone 20 mg daily x5 days and then resume your at home prednisone.  You know that you have been on amoxicillin and Biaxin.  If you want to hold off on antibiotics at this time that is okay.  If you feel like your cough is not improving over the next few days or worsens then fill and complete the course of doxycycline.  Contact your cardiologist tomorrow and let them know that you have had a lot of weight loss with the diuretic.  Your blood pressure is okay today but it could be dropping in relation to the diuretic.  Make sure you are drinking plenty of fluids when you take this medication.  Keep a check of your blood pressure at home and let the cardiologist know what it is running there.  If you have any acute worsening of your cough, develop a fever or have increased breathing difficulty or weakness call 911 or go to emergency department.

## 2020-04-18 NOTE — ED Triage Notes (Signed)
Pt c/o wheezing, coughing, sweats, headache, weakness, and shaking. Started about 2 days ago. She states she checked her BP today and was 84//58.

## 2020-04-18 NOTE — ED Provider Notes (Signed)
[facility]    CSN: [id number] Arrival date & time: 04/18/20  1610      History   Chief Complaint Chief Complaint  Patient presents with  . Hypotension  . Wheezing    HPI Wendy Sanchez is a 70 y.o. female presenting for approximately 2 to 3-day history of increased fatigue, headaches, sweats, cough, sore throat and congestion.  Patient also states she has been wheezing and a little more short of breath than normal.  She denies any fevers no chest pain or abdominal pain.  No nausea/vomiting or diarrhea.  She denies any sick contacts and no known exposure to COVID-19.  Not vaccinated for COVID-19.  Personal history of COVID-19 in January 2022.  Patient has complex medical history.  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  Additionally she has temporal arteritis and takes 1 mg prednisone a day.  She also was diagnosed with H. pylori infection and has been taking amoxicillin and Biaxin for the past 2 weeks.  She says that she finishes these antibiotics tomorrow.  Patient did see her cardiologist 1 week ago and her Lasix was changed to torsemide.  She states that she has had about a 10 pound weight loss since.  She admits to having a lot more urination.  Patient states that she spoke with the office today and they advised her that based on her symptoms and blood pressure being low at 84/58 at home that she should get checked out for possible dehydration.  She denies any painful urination or flank pain.  She has no other complaints or concerns at this time.  HPI  Past Medical History:  Diagnosis Date  . Allergic rhinitis   . Arthritis   . Asthma    problems with fumes and aerosols cause asthma  . Chest pain 10/19/2016  . Complication of anesthesia    awakens during surgery; has occurred  with last 3-4 surgeries   . COPD (chronic obstructive pulmonary disease) ([place])   . DVT (deep venous thrombosis) ([place]) 07/13/2018   Right leg  . Environmental allergies    fumes   . Fibromyalgia   . GERD (gastroesophageal reflux disease)   . Headache   . Hemorrhoids   . History of bronchitis   . Hx of total knee arthroplasty 12/13/2015  . Hypertension   . Morbid obesity with BMI of 45.0-49.9, adult ([place]) 07/25/2015  . NICM (nonischemic cardiomyopathy) ([place])    a. ? PVC mediated;  b. 06/2016 Echo: EF 25-30%, mild LVH, mild LAE, mild AI/MR/TR/PR; c. 08/2016 Cath: nl cors, EF 25%.  . Numbness    hands bilat when driving; improves when not preforming task   . Osteoarthritis of left knee 08/02/2015  . PE (pulmonary thromboembolism) ([place]) 07/13/2018  . Pes anserinus bursitis of left knee 05/18/2016  . Presence of right artificial knee joint 05/18/2016  . PVC (premature ventricular contraction) 06/04/2017  . PVC's (premature ventricular contractions)    a. 11/2016 Amio started;  b. 11/29/16 48h Holter: 01027 PVC's (41%); c. 12/2016 24h Holter: 18040 PVC's (15%); c. 02/2017 48h Holter: 37262 PVC's (41%).  . Sleep apnea    cannot tolerate CPAP  . Spinal stenosis of lumbar region 06/19/2015  . Status post gastric banding   . 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 Tinnitus    comes and goes   .  Unilateral primary osteoarthritis, right knee 12/13/2015  . Vertigo    none for over 2 yrs    Patient Active Problem List   Diagnosis Date Noted  . Frontal headache 05/30/2019  . Carcinoma, renal cell, left ([place]) 11/14/2018  . Pulmonary embolism ([place]) 07/13/2018  . DDD (degenerative disc disease), lumbar 06/30/2018  . Chronic low back pain 06/30/2018  . Hip pain, bilateral 06/30/2018  . Chronic, continuous use of opioids 06/30/2018  . PVC (premature ventricular contraction) 06/04/2017  . Chest pain 10/19/2016  . Presence of right artificial knee joint 05/18/2016  . Pes anserinus bursitis  of left knee 05/18/2016  . Unilateral primary osteoarthritis, right knee 12/13/2015  . Hx of total knee arthroplasty 12/13/2015  . Osteoarthritis of left knee 08/02/2015  . 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 Morbid obesity with BMI of 45.0-49.9, adult ([place]) 07/25/2015  . Spinal stenosis of lumbar region 06/19/2015  . Chronic pain syndrome 10/30/2014    Past Surgical History:  Procedure Laterality Date  . ABDOMINAL HYSTERECTOMY  1979   left ovary remains  . ABDOMINAL HYSTERECTOMY    . ARTERY BIOPSY Left 06/07/2019   Procedure: BIOPSY TEMPORAL ARTERY;  Surgeon: Algernon Huxley, MD;  Location: [facility] ORS;  Service: Vascular;  Laterality: Left;  . CHOLECYSTECTOMY    . COLONOSCOPY    . COLONOSCOPY N/A 10/30/2019   Procedure: COLONOSCOPY;  Surgeon: Lesly Rubenstein, MD;  Location: [facility] ENDOSCOPY;  Service: Endoscopy;  Laterality: N/A;  . ESOPHAGOGASTRODUODENOSCOPY N/A 10/30/2019   Procedure: ESOPHAGOGASTRODUODENOSCOPY (EGD);  Surgeon: Lesly Rubenstein, MD;  Location: [facility] ENDOSCOPY;  Service: Endoscopy;  Laterality: N/A;  . fibrous tissue removed from right shoulder and back of neck      2 years ago   . FLOOR OF MOUTH BIOPSY N/A 02/28/2019   Procedure: SALIVARY GLAND BIOPSY;  Surgeon: Carloyn Manner, MD;  Location: [facility] ORS;  Service: ENT;  Laterality: N/A;  . FRACTURE SURGERY     left small finger  . GANGLION CYST EXCISION    . GASTRIC BYPASS  1980   states had bypass with banding and band left in -  . GASTRIC BYPASS OPEN    . JOINT REPLACEMENT Bilateral 2017   knees  . KNEE SURGERY Bilateral    both knees  2001  . NECK SURGERY N/A 1205/19   ganglion cyst removal, [place]   . PULMONARY THROMBECTOMY Bilateral 07/14/2018   Procedure: PULMONARY THROMBECTOMY;  Surgeon: Algernon Huxley, MD;  Location: [facility];  Service: Cardiovascular;  Laterality: Bilateral;  . PVC ABLATION N/A 06/04/2017   Procedure: PVC ABLATION;  Surgeon: Evans Lance, MD;  Location:  [facility] [place];  Service: Cardiovascular;  Laterality: N/A;  . RIGHT/LEFT HEART CATH AND CORONARY ANGIOGRAPHY Bilateral 08/12/2016   Procedure: Right/Left Heart Cath and Coronary Angiography;  Surgeon: Yolonda Kida, MD;  Location: [facility];  Service: Cardiovascular;  Laterality: Bilateral;  . TONSILLECTOMY     age 43  . TOTAL KNEE ARTHROPLASTY Left 08/02/2015   Procedure: LEFT TOTAL KNEE ARTHROPLASTY;  Surgeon: Mcarthur Rossetti, MD;  Location: [facility] ORS;  Service: Orthopedics;  Laterality: Left;  . TOTAL KNEE ARTHROPLASTY Right 12/13/2015   Procedure: RIGHT TOTAL KNEE ARTHROPLASTY;  Surgeon: Mcarthur Rossetti, MD;  Location: [facility] ORS;  Service: Orthopedics;  Laterality: Right;    OB History   No obstetric history on file.      Home Medications    Prior to Admission medications  Medication Sig Start Date End Date Taking? Authorizing Provider  albuterol (PROVENTIL HFA;VENTOLIN HFA) 108 (90 Base) MCG/ACT inhaler Inhale 2 puffs into the lungs every 6 (six) hours as needed for wheezing or shortness of breath.   Yes [provider]  amoxicillin (AMOXIL) 500 MG tablet Take 1,000 mg by mouth 2 (two) times daily.   Yes [provider]  apixaban (ELIQUIS) 5 MG TABS tablet Take 1 tablet (5 mg total) by mouth 2 (two) times daily. 07/22/18 06/07/19 Yes Pyreddy, Reatha Harps, MD  Ascorbic Acid (VITAMIN C PO) Take 2 tablets by mouth daily.   Yes [provider]  azelastine (ASTELIN) 0.1 % nasal spray Place 1 spray into both nostrils 2 (two) times daily as needed for allergies. Use in each nostril as directed   Yes [provider]  carvedilol (COREG) 6.25 MG tablet Take 1 tablet (6.25 mg total) by mouth 2 (two) times daily. 07/28/18  Yes Deboraha Sprang, MD  clarithromycin (BIAXIN) 500 MG tablet Take 1,000 mg by mouth daily.   Yes [provider]  diclofenac sodium (VOLTAREN) 1 % GEL Apply 2 g topically 4 (four) times daily as needed (pain).     Yes [provider]  diphenhydrAMINE (BENADRYL) 25 mg capsule Take 50 mg by mouth every 6 (six) hours as needed for allergies.    Yes [provider]  docusate sodium (COLACE) 100 MG capsule Take 100 mg by mouth at bedtime as needed for mild constipation.    Yes [provider]  doxycycline (VIBRAMYCIN) 100 MG capsule Take 1 capsule (100 mg total) by mouth 2 (two) times daily for 5 days. 04/18/20 04/23/20 Yes Laurene Footman B, PA-C  ELDERBERRY PO Take 100 mg by mouth daily.    Yes [provider]  fluconazole (DIFLUCAN) 100 MG tablet Take 100 mg by mouth daily as needed (yeast infections).   Yes [provider]  folic acid (FOLVITE) 1 MG tablet Take 1 mg by mouth at bedtime.    Yes [provider]  gabapentin (NEURONTIN) 300 MG capsule Take 2 capsules (600 mg total) by mouth See admin instructions. Take 600 mg by mouth twice daily, may take a third 600 mg dose as needed for pain 03/05/20 06/03/20 Yes Molli Barrows, MD  Guaifenesin 1200 MG TB12 Take 1,200 mg by mouth 2 (two) times daily as needed (congestion).   Yes [provider]  ipratropium-albuterol (DUONEB) 0.5-2.5 (3) MG/3ML SOLN Take 3 mLs by nebulization every 6 (six) hours as needed (shortness of breath).   Yes [provider]  levocetirizine (XYZAL) 5 MG tablet Take 5 mg by mouth at bedtime.    Yes [provider]  mirabegron ER (MYRBETRIQ) 50 MG TB24 tablet Take 50 mg by mouth daily.   Yes [provider]  montelukast (SINGULAIR) 10 MG tablet Take 10 mg by mouth at bedtime.   Yes [provider]  Multiple Vitamins-Minerals (MULTIVITAMIN GUMMIES ADULTS) CHEW Chew 2 tablets by mouth daily.    Yes [provider]  nortriptyline (PAMELOR) 50 MG capsule Take 50 mg by mouth at bedtime.   Yes [provider]  nystatin (MYCOSTATIN) 100000 UNIT/ML suspension Take 5 mLs by mouth 4 (four) times daily as needed (yeast).    Yes [provider]  nystatin cream (MYCOSTATIN) Apply 1 application topically 2 (two) times daily as needed (yeast).   Yes [provider]  omeprazole (PRILOSEC) 20 MG capsule Take 20 mg by mouth in the morning and  at bedtime.   Yes [provider]  oxyCODONE-acetaminophen (PERCOCET) 10-325 MG tablet Take 1 tablet by mouth every 4 (four) hours as needed for pain. 04/11/20 05/11/20 Yes Molli Barrows, MD  Polyvinyl Alcohol-Povidone (REFRESH OP) Place 1 drop into both eyes daily as needed (dry eyes).   Yes [provider]  potassium chloride SA (K-DUR,KLOR-CON) 20 MEQ tablet Take 20 mEq by mouth 3 days.    Yes [provider]  predniSONE (DELTASONE) 1 MG tablet Take 3 mg by mouth daily.   Yes [provider]  predniSONE (DELTASONE) 20 MG tablet Take 1 tablet (20 mg total) by mouth daily for 5 days. 04/18/20 04/23/20 Yes Laurene Footman B, PA-C  Probiotic Product (PROBIOTIC COLON SUPPORT) CAPS Take 1 capsule by mouth at bedtime.   Yes [provider]  sacubitril-valsartan (ENTRESTO) 49-51 MG Take 1 tablet by mouth 2 (two) times daily.   Yes [provider]  sucralfate (CARAFATE) 1 G tablet Take 1 g by mouth 2 (two) times daily before a meal.   Yes [provider]  torsemide (DEMADEX) 20 MG tablet Take 2 tablets (40 mg) by mouth once daily 04/12/20  Yes Deboraha Sprang, MD  umeclidinium-vilanterol (ANORO ELLIPTA) 62.5-25 MCG/INH AEPB Inhale 1 puff into the lungs daily. 12/23/17  Yes [provider]  Vitamin D, Ergocalciferol, (DRISDOL) 50000 UNITS CAPS capsule Take 50,000 Units by mouth every Thursday.    Yes [provider]  albuterol (PROVENTIL) (2.5 MG/3ML) 0.083% nebulizer solution Take 3 mLs (2.5 mg total) by nebulization every 6 (six) hours as needed for wheezing or shortness of breath. 01/19/20   Rodriguez-Southworth, Sunday Spillers, PA-C  Dextromethorphan-guaiFENesin (CORICIDIN HBP CONGESTION/COUGH) 10-200 MG CAPS Take 2 tablets by  mouth 2 (two) times daily as needed (cough / congestion). Patient not taking: Reported on 04/11/2020    [provider]  Nirmatrelvir & Ritonavir 20 x 150 MG & 10 x 100MG TBPK TAKE 2 NIRMATRELVIR TABLETS AND TAKE 1 RITONAVIR TABLET BY MOUTH TWICE DAILY FOR 5 DAYS 01/21/20 01/20/21  Jacquelin Hawking, NP  pantoprazole (PROTONIX) 40 MG tablet Take 40 mg by mouth 2 (two) times daily.    [provider]    Family History Family History  Problem Relation Age of Onset  . Hypertension Father   . Deep vein thrombosis Father   . Dementia Mother   . Diabetes Sister   . Congestive Heart Failure Sister   . Congestive Heart Failure Brother   . Congestive Heart Failure Daughter   . Congestive Heart Failure Son   . Breast cancer Maternal Aunt        great aunt    Social History Social History   Tobacco Use  . Smoking status: Former Smoker    Packs/day: 0.25    Years: 50.00    Pack years: 12.50    Types: Cigarettes    Quit date: 07/06/2018    Years since quitting: 1.7  . Smokeless tobacco: Current User    Types: Chew  Vaping Use  . Vaping Use: Never used  Substance Use Topics  . Alcohol use: No    Alcohol/week: 0.0 standard drinks  . Drug use: No     Allergies   Hydrocodone-acetaminophen, Iodine, Other, Tape, Peanut oil, and Shellfish allergy   Review of Systems Review of Systems  Constitutional: Positive for diaphoresis and fatigue. Negative for chills and fever.  HENT: Positive for congestion, postnasal drip, rhinorrhea and sore throat. Negative for ear pain, sinus pressure and  sinus pain.   Respiratory: Positive for cough and shortness of breath.   Cardiovascular: Positive for leg swelling (chronic but today it is "the best it has been in a year"). Negative for palpitations.  Gastrointestinal: Negative for abdominal pain, nausea and vomiting.  Genitourinary: Positive for frequency.  Musculoskeletal: Negative for arthralgias and myalgias.  Skin: Negative for  rash.  Neurological: Positive for headaches. Negative for weakness.  Hematological: Negative for adenopathy.     Physical Exam Triage Vital Signs ED Triage Vitals  Enc Vitals Group     BP 04/18/20 1638 107/69     Pulse Rate 04/18/20 1638 99     Resp 04/18/20 1638 20     Temp 04/18/20 1638 98.3 F (36.8 C)     Temp Source 04/18/20 1638 Oral     SpO2 04/18/20 1638 97 %     Weight 04/18/20 1632 (!) 307 lb 1.6 oz (139.3 kg)     Height 04/18/20 1632 _0  (1.702 m)     Head Circumference --      Peak Flow --      Pain Score 04/18/20 1631 8     Pain Loc --      Pain Edu? --      Excl. in [place]? --    No data found.  Updated Vital Signs BP 107/69 (BP Location: Left Arm)   Pulse 99   Temp 98.3 F (36.8 C) (Oral)   Resp 20   Ht _1  (1.702 m)   Wt 297 lb (134.7 kg)   SpO2 97%   BMI 46.52 kg/m       Physical Exam Vitals and nursing note reviewed.  Constitutional:      General: She is not in acute distress.    Appearance: Normal appearance. She is obese. She is not ill-appearing or toxic-appearing.  HENT:     Head: Normocephalic and atraumatic.     Nose: Congestion and rhinorrhea (trace clear drainage) present.     Mouth/Throat:     Mouth: Mucous membranes are moist.     Pharynx: Oropharynx is clear. Posterior oropharyngeal erythema (large amount of clear PND) present.  Eyes:     General: No scleral icterus.       Right eye: No discharge.        Left eye: No discharge.     Conjunctiva/sclera: Conjunctivae normal.  Cardiovascular:     Rate and Rhythm: Normal rate and regular rhythm.     Heart sounds: Normal heart sounds.  Pulmonary:     Effort: Pulmonary effort is normal. No respiratory distress.     Breath sounds: Wheezing (diffuse wheezing throughout chest) present. No rhonchi or rales.  Musculoskeletal:     Cervical back: Neck supple.     Right lower leg: Edema (1+ pitting edema to distal tibia ) present.     Left lower leg: Edema (1+ pitting edema to distal  tibia) present.  Skin:    General: Skin is dry.  Neurological:     General: No focal deficit present.     Mental Status: She is alert. Mental status is at baseline.     Motor: No weakness.     Gait: Gait normal.  Psychiatric:        Mood and Affect: Mood normal.        Behavior: Behavior normal.        Thought Content: Thought content normal.      UC Treatments / Results  Labs (all labs ordered  are listed, but only abnormal results are displayed) Labs Reviewed  URINALYSIS, COMPLETE (UACMP) WITH MICROSCOPIC - Abnormal; Notable for the following components:      Result Value   Color, Urine AMBER (*)    Bilirubin Urine SMALL (*)    Protein, ur 30 (*)    Bacteria, UA FEW (*)    All other components within normal limits  COMPREHENSIVE METABOLIC PANEL - Abnormal; Notable for the following components:   Glucose, Bld 113 (*)    Calcium 8.6 (*)    Albumin 2.6 (*)    AST 46 (*)    ALT 46 (*)    Alkaline Phosphatase 142 (*)    All other components within normal limits  CBC WITH DIFFERENTIAL/PLATELET - Abnormal; Notable for the following components:   Hemoglobin 10.8 (*)    HCT 33.5 (*)    RDW 16.5 (*)    Neutro Abs 8.4 (*)    All other components within normal limits  RESP PANEL BY RT-PCR (FLU A&B, COVID) [id number]    EKG   Radiology DG Chest 2 View  Result Date: 04/18/2020 CLINICAL DATA:  wheezing, cough, shortness of breath EXAM: CHEST - 2 VIEW COMPARISON:  07/13/2018 FINDINGS: Heart is borderline in size. Lungs clear. No effusions or edema. No acute bony abnormality. IMPRESSION: No active cardiopulmonary disease. Electronically Signed   By: Rolm Baptise M.D.   On: 04/18/2020 17:21    Procedures Procedures (including critical care time)  Medications Ordered in UC Medications - No data to display  Initial Impression / Assessment and Plan / UC Course  I have reviewed the triage vital signs and the nursing notes.  Pertinent labs & imaging results that were available  during my care of the patient were reviewed by me and considered in my medical decision making (see chart for details).    70 year old female with complicated past medical history significant for COPD, CHF, hypertension, DVT and PE on long-term anticoagulant, hypertension, morbid obesity, and OSA presenting for 2 to 3-day history of increased fatigue, cough, congestion, sore throat, wheezing and shortness of breath.  Her vital signs are all stable.  She is afebrile.  Blood pressure is 107/69.  Oxygen is 97%.  Patient is overall well-appearing and in no acute distress.  Exam significant for nasal congestion and rhinorrhea, mild posterior pharyngeal erythema with a large amount of clear postnasal drainage, and diffuse wheezing throughout chest.  Additionally she does have 1+ pitting edema to distal tibia.  She states that this is the least amount of swelling she has had and about a year.  Chest x-ray obtained today which does not show any acute cardiopulmonary abnormality.  Respiratory panel obtained today to assess for possible flu or COVID-19 given her cold symptoms.  Respiratory panel negative for flu and Covid.  Labs drawn for CBC, CMP and urinalysis.  Assessing for possible electrolyte disturbances related to diuretic use.  Labs show urine with specific gravity 1.020, small bili and small protein.    CBC shows hemoglobin of 10.8.  Her blood work from a week ago shows hemoglobin of 11.7.  She denies any history of anemia.  She has not had any obvious bleeding.  She is denying any abdominal pain.  CMP shows glucose of 113, but patient is not fasting.  Additionally she has slightly elevated AST of 46 and slightly elevated ALT at 46.  She also has a slightly elevated alk phos at 142.  Reviewed labs with patient and advised her that they  over all are very reassuring.  I suspect that she probably has a viral infection and a exacerbation of her COPD.  I did prescribe her prednisone 20 mg a day for 5  days.  Advised her to go back on her 1 mg tablets when she finishes this.  I printed a prescription for doxycycline as she is a little reluctant to take more antibiotics at this time.  Advised her if her cough worsens she should fill and take this prescription.  Patient not showing any laboratory or clinical signs of dehydration.  Advised her to continue to increase her fluid intake a lot.  Advised her to contact her cardiologist tomorrow and let them know that how much weight she has lost and see if this dose is appropriate for her.  Advised her to keep a log of her blood pressures at home so that she can review this with her cardiologist.  Did advise patient to try to follow-up with PCP next week so that she can have her labs rechecked, especially her hemoglobin and liver enzymes.  I did review all ED red flag signs and symptoms with patient and advised her that if she is feeling more fatigued, her cough worsens, she starts to run a fever, she has any chest pain or increase in her breathing difficulty or breathing problem that is not controlled well enough with her inhalers at home and she should call 911 or have someone take her to the emergency department for evaluation.  Otherwise, continue to follow-up with PCP and specialists.   Final Clinical Impressions(s) / UC Diagnoses   Final diagnoses:  COPD exacerbation (HCC)  Wheezing  Cough  Chronic congestive heart failure, unspecified heart failure type (HCC)  Shortness of breath  Fatigue, unspecified type     Discharge Instructions     I will call you with the results of your flu and Covid test that not back yet.  I do believe your symptoms are consistent with a viral illness versus possible allergies.  Your chest x-ray was normal but you do have a lot of wheezing.  This is likely due to a COPD exacerbation.  Take prednisone 20 mg daily x5 days and then resume your at home prednisone.  You know that you have been on amoxicillin and Biaxin.   If you want to hold off on antibiotics at this time that is okay.  If you feel like your cough is not improving over the next few days or worsens then fill and complete the course of doxycycline.  Contact your cardiologist tomorrow and let them know that you have had a lot of weight loss with the diuretic.  Your blood pressure is okay today but it could be dropping in relation to the diuretic.  Make sure you are drinking plenty of fluids when you take this medication.  Keep a check of your blood pressure at home and let the cardiologist know what it is running there.  If you have any acute worsening of your cough, develop a fever or have increased breathing difficulty or weakness call 911 or go to emergency department.    ED Prescriptions    Medication Sig Dispense Auth. Provider   predniSONE (DELTASONE) 20 MG tablet Take 1 tablet (20 mg total) by mouth daily for 5 days. 5 tablet Laurene Footman B, PA-C   doxycycline (VIBRAMYCIN) 100 MG capsule Take 1 capsule (100 mg total) by mouth 2 (two) times daily for 5 days. 10 capsule Danton Clap, PA-C  PDMP not reviewed this encounter.   Danton Clap, PA-C 04/18/20 1936

## 2020-04-25 DIAGNOSIS — Z85528 Personal history of other malignant neoplasm of kidney: Secondary | ICD-10-CM

## 2020-04-29 ENCOUNTER — Telehealth: Payer: Self-pay | Admitting: Student in an Organized Health Care Education/Training Program

## 2020-05-01 ENCOUNTER — Encounter: Payer: Self-pay | Admitting: Anesthesiology

## 2020-05-01 ENCOUNTER — Other Ambulatory Visit: Payer: Self-pay

## 2020-05-01 ENCOUNTER — Ambulatory Visit: Payer: Medicare Other | Attending: Anesthesiology | Admitting: Anesthesiology

## 2020-05-01 DIAGNOSIS — G8929 Other chronic pain: Secondary | ICD-10-CM

## 2020-05-01 DIAGNOSIS — M316 Other giant cell arteritis: Secondary | ICD-10-CM

## 2020-05-01 DIAGNOSIS — M19011 Primary osteoarthritis, right shoulder: Secondary | ICD-10-CM

## 2020-05-01 DIAGNOSIS — M5442 Lumbago with sciatica, left side: Secondary | ICD-10-CM

## 2020-05-01 DIAGNOSIS — M5441 Lumbago with sciatica, right side: Secondary | ICD-10-CM

## 2020-05-01 DIAGNOSIS — M1711 Unilateral primary osteoarthritis, right knee: Secondary | ICD-10-CM

## 2020-05-01 DIAGNOSIS — M25551 Pain in right hip: Secondary | ICD-10-CM

## 2020-05-01 DIAGNOSIS — G894 Chronic pain syndrome: Secondary | ICD-10-CM

## 2020-05-01 DIAGNOSIS — M5136 Other intervertebral disc degeneration, lumbar region: Secondary | ICD-10-CM | POA: Diagnosis not present

## 2020-05-01 DIAGNOSIS — F1190 Opioid use, unspecified, uncomplicated: Secondary | ICD-10-CM | POA: Diagnosis not present

## 2020-05-01 DIAGNOSIS — M25552 Pain in left hip: Secondary | ICD-10-CM

## 2020-05-01 DIAGNOSIS — M48062 Spinal stenosis, lumbar region with neurogenic claudication: Secondary | ICD-10-CM

## 2020-05-01 DIAGNOSIS — M51369 Other intervertebral disc degeneration, lumbar region without mention of lumbar back pain or lower extremity pain: Secondary | ICD-10-CM

## 2020-05-01 HISTORY — DX: Other giant cell arteritis: M31.6

## 2020-05-01 MED ORDER — GABAPENTIN 300 MG PO CAPS: 600.0000 mg | ORAL_CAPSULE | ORAL | 2 refills | Status: DC

## 2020-05-01 MED ORDER — OXYCODONE-ACETAMINOPHEN 10-325 MG PO TABS: 1.0000 | ORAL_TABLET | ORAL | 0 refills | Status: DC | PRN

## 2020-05-01 NOTE — Progress Notes (Signed)
Virtual Visit via Telephone Note  I connected with Wendy Sanchez on 05/01/20 at  2:00 PM EDT by telephone and verified that I am speaking with the correct person using two identifiers.  Location: Patient: Home Provider: [facility]   I discussed the limitations, risks, security and privacy concerns of performing an evaluation and management service by telephone and the availability of in person appointments. I also discussed with the patient that there may be a patient responsible charge related to this service. The patient expressed understanding and agreed to proceed.   History of Present Illness: I was able to speak with Wendy Sanchez today via telephone as she was unable to do the video portion of virtual conference.  She reports that she has had a fair amount of difficulty with a recent flareup of her temporal arteritis following a wean of her prednisone.  She was hospitalized a few weeks ago and started on an additional steroid course.  She is still on 60 mg a day and she reports that her headaches are better and this also has helped somewhat with her back.  She is taking her Percocet 10 mg tablets 5 times a day and this keeps her pain under good control.  She reports approximately 70% relief with the medication lasting about 4 hours before she has breakthrough of her low back knee and hip pain.  This pain has been stable in nature and no recent changes are noted.  The quality characteristic and distribution of the pain are also unchanged.  Her lower extremity strength and function are at baseline with no recent changes in bowel or bladder function.  She tries to ambulate as best she can but is limited secondary to her weight and for overall conditioning.  She reports that the medications do help keep her pain under good control and without them she would be dysfunctional.  Unfortunately no more conservative therapies have helped her but she is currently doing well with this regimen.    Observations/Objective:   Current Outpatient Medications:  .  [START ON 06/10/2020] oxyCODONE-acetaminophen (PERCOCET) 10-325 MG tablet, Take 1 tablet by mouth every 4 (four) hours as needed for pain., Disp: 150 tablet, Rfl: 0 .  albuterol (PROVENTIL HFA;VENTOLIN HFA) 108 (90 Base) MCG/ACT inhaler, Inhale 2 puffs into the lungs every 6 (six) hours as needed for wheezing or shortness of breath., Disp: , Rfl:  .  albuterol (PROVENTIL) (2.5 MG/3ML) 0.083% nebulizer solution, Take 3 mLs (2.5 mg total) by nebulization every 6 (six) hours as needed for wheezing or shortness of breath., Disp: 75 mL, Rfl: 0 .  amoxicillin (AMOXIL) 500 MG tablet, Take 1,000 mg by mouth 2 (two) times daily., Disp: , Rfl:  .  apixaban (ELIQUIS) 5 MG TABS tablet, Take 1 tablet (5 mg total) by mouth 2 (two) times daily., Disp: 60 tablet, Rfl: 0 .  Ascorbic Acid (VITAMIN C PO), Take 2 tablets by mouth daily., Disp: , Rfl:  .  azelastine (ASTELIN) 0.1 % nasal spray, Place 1 spray into both nostrils 2 (two) times daily as needed for allergies. Use in each nostril as directed, Disp: , Rfl:  .  carvedilol (COREG) 6.25 MG tablet, Take 1 tablet (6.25 mg total) by mouth 2 (two) times daily., Disp: 180 tablet, Rfl: 3 .  clarithromycin (BIAXIN) 500 MG tablet, Take 1,000 mg by mouth daily., Disp: , Rfl:  .  Dextromethorphan-guaiFENesin (CORICIDIN HBP CONGESTION/COUGH) 10-200 MG CAPS, Take 2 tablets by mouth 2 (two) times daily as  needed (cough / congestion). (Patient not taking: Reported on 04/11/2020), Disp: , Rfl:  .  diclofenac sodium (VOLTAREN) 1 % GEL, Apply 2 g topically 4 (four) times daily as needed (pain). , Disp: , Rfl:  .  diphenhydrAMINE (BENADRYL) 25 mg capsule, Take 50 mg by mouth every 6 (six) hours as needed for allergies. , Disp: , Rfl:  .  docusate sodium (COLACE) 100 MG capsule, Take 100 mg by mouth at bedtime as needed for mild constipation. , Disp: , Rfl:  .  ELDERBERRY PO, Take 100 mg by mouth daily. , Disp: , Rfl:   .  fluconazole (DIFLUCAN) 100 MG tablet, Take 100 mg by mouth daily as needed (yeast infections)., Disp: , Rfl:  .  folic acid (FOLVITE) 1 MG tablet, Take 1 mg by mouth at bedtime. , Disp: , Rfl:  .  gabapentin (NEURONTIN) 300 MG capsule, Take 2 capsules (600 mg total) by mouth See admin instructions. Take 600 mg by mouth twice daily, may take a third 600 mg dose as needed for pain, Disp: 180 capsule, Rfl: 2 .  Guaifenesin 1200 MG TB12, Take 1,200 mg by mouth 2 (two) times daily as needed (congestion)., Disp: , Rfl:  .  ipratropium-albuterol (DUONEB) 0.5-2.5 (3) MG/3ML SOLN, Take 3 mLs by nebulization every 6 (six) hours as needed (shortness of breath)., Disp: , Rfl:  .  levocetirizine (XYZAL) 5 MG tablet, Take 5 mg by mouth at bedtime. , Disp: , Rfl:  .  mirabegron ER (MYRBETRIQ) 50 MG TB24 tablet, Take 50 mg by mouth daily., Disp: , Rfl:  .  montelukast (SINGULAIR) 10 MG tablet, Take 10 mg by mouth at bedtime., Disp: , Rfl:  .  Multiple Vitamins-Minerals (MULTIVITAMIN GUMMIES ADULTS) CHEW, Chew 2 tablets by mouth daily. , Disp: , Rfl:  .  Nirmatrelvir & Ritonavir 20 x 150 MG & 10 x 100MG  TBPK, TAKE 2 NIRMATRELVIR TABLETS AND TAKE 1 RITONAVIR TABLET BY MOUTH TWICE DAILY FOR 5 DAYS, Disp: 30 each, Rfl: 0 .  nortriptyline (PAMELOR) 50 MG capsule, Take 50 mg by mouth at bedtime., Disp: , Rfl:  .  nystatin (MYCOSTATIN) 100000 UNIT/ML suspension, Take 5 mLs by mouth 4 (four) times daily as needed (yeast). , Disp: , Rfl:  .  nystatin cream (MYCOSTATIN), Apply 1 application topically 2 (two) times daily as needed (yeast)., Disp: , Rfl:  .  omeprazole (PRILOSEC) 20 MG capsule, Take 20 mg by mouth in the morning and at bedtime., Disp: , Rfl:  .  [START ON 05/11/2020] oxyCODONE-acetaminophen (PERCOCET) 10-325 MG tablet, Take 1 tablet by mouth every 4 (four) hours as needed for pain., Disp: 150 tablet, Rfl: 0 .  pantoprazole (PROTONIX) 40 MG tablet, Take 40 mg by mouth 2 (two) times daily., Disp: , Rfl:  .   Polyvinyl Alcohol-Povidone (REFRESH OP), Place 1 drop into both eyes daily as needed (dry eyes)., Disp: , Rfl:  .  potassium chloride SA (K-DUR,KLOR-CON) 20 MEQ tablet, Take 20 mEq by mouth 3 days. , Disp: , Rfl:  .  predniSONE (DELTASONE) 1 MG tablet, Take 3 mg by mouth daily., Disp: , Rfl:  .  Probiotic Product (PROBIOTIC COLON SUPPORT) CAPS, Take 1 capsule by mouth at bedtime., Disp: , Rfl:  .  sacubitril-valsartan (ENTRESTO) 49-51 MG, Take 1 tablet by mouth 2 (two) times daily., Disp: , Rfl:  .  sucralfate (CARAFATE) 1 G tablet, Take 1 g by mouth 2 (two) times daily before a meal., Disp: , Rfl:  .  torsemide (  DEMADEX) 20 MG tablet, Take 2 tablets (40 mg) by mouth once daily, Disp: 60 tablet, Rfl: 3 .  umeclidinium-vilanterol (ANORO ELLIPTA) 62.5-25 MCG/INH AEPB, Inhale 1 puff into the lungs daily., Disp: , Rfl:  .  Vitamin D, Ergocalciferol, (DRISDOL) 50000 UNITS CAPS capsule, Take 50,000 Units by mouth every Thursday. , Disp: , Rfl:  Assessment and Plan: 1. DDD (degenerative disc disease), lumbar   2. Chronic bilateral low back pain with bilateral sciatica   3. Chronic, continuous use of opioids   4. Chronic pain syndrome   5. Hip pain, bilateral   6. Chronic pain of right hip   7. Primary osteoarthritis of right shoulder   8. Spinal stenosis of lumbar region with neurogenic claudication   9. Unilateral primary osteoarthritis, right knee   10. Temporal arteritis ([place])   Based on our discussion today and upon review of the [facility] practitioner database information I think it is appropriate to refill her medications.  These be dated for April 30 and May 30.  No other change will be initiated today.  I encouraged her to continue efforts at weight loss stretching strengthening and efforts at conditioning as tolerated.  Continue stretching exercises.  She is currently on prednisone and her primary care team will continue to wean this as tolerated.  She is not an interventional candidate.   She is scheduled for 40-month return to clinic.   Follow Up Instructions:    I discussed the assessment and treatment plan with the patient. The patient was provided an opportunity to ask questions and all were answered. The patient agreed with the plan and demonstrated an understanding of the instructions.   The patient was advised to call back or seek an in-person evaluation if the symptoms worsen or if the condition fails to improve as anticipated.  I provided 30 minutes of non-face-to-face time during this encounter.   Molli Barrows, MD

## 2020-05-06 ENCOUNTER — Other Ambulatory Visit: Payer: Self-pay | Admitting: Orthopaedic Surgery

## 2020-05-06 DIAGNOSIS — M25511 Pain in right shoulder: Secondary | ICD-10-CM

## 2020-05-19 ENCOUNTER — Ambulatory Visit
Admission: RE | Admit: 2020-05-19 | Discharge: 2020-05-19 | Disposition: A | Discharge status: Home / Self Care | Payer: Medicare Other | Source: Ambulatory Visit | Attending: Orthopaedic Surgery | Admitting: Orthopaedic Surgery

## 2020-05-19 ENCOUNTER — Other Ambulatory Visit: Payer: Self-pay

## 2020-05-19 DIAGNOSIS — M25511 Pain in right shoulder: Secondary | ICD-10-CM

## 2020-05-21 ENCOUNTER — Other Ambulatory Visit: Payer: Self-pay | Admitting: Family Medicine

## 2020-05-21 DIAGNOSIS — N6331 Unspecified lump in axillary tail of the right breast: Secondary | ICD-10-CM

## 2020-05-29 ENCOUNTER — Ambulatory Visit: Payer: Medicare Other | Attending: Family | Admitting: Family

## 2020-05-29 ENCOUNTER — Other Ambulatory Visit: Payer: Self-pay

## 2020-05-29 ENCOUNTER — Encounter: Payer: Self-pay | Admitting: Family

## 2020-05-29 ENCOUNTER — Encounter: Payer: Self-pay | Admitting: Pharmacist

## 2020-05-29 VITALS — BP 147/84 | HR 118 | Resp 20 | Ht 67.0 in | Wt 297.2 lb

## 2020-05-29 DIAGNOSIS — R0789 Other chest pain: Secondary | ICD-10-CM | POA: Insufficient documentation

## 2020-05-29 DIAGNOSIS — I890 Lymphedema, not elsewhere classified: Secondary | ICD-10-CM | POA: Diagnosis not present

## 2020-05-29 DIAGNOSIS — J449 Chronic obstructive pulmonary disease, unspecified: Secondary | ICD-10-CM | POA: Diagnosis not present

## 2020-05-29 DIAGNOSIS — G8929 Other chronic pain: Secondary | ICD-10-CM | POA: Insufficient documentation

## 2020-05-29 DIAGNOSIS — Z888 Allergy status to other drugs, medicaments and biological substances status: Secondary | ICD-10-CM | POA: Diagnosis not present

## 2020-05-29 DIAGNOSIS — Z9884 Bariatric surgery status: Secondary | ICD-10-CM | POA: Diagnosis not present

## 2020-05-29 DIAGNOSIS — Z79899 Other long term (current) drug therapy: Secondary | ICD-10-CM | POA: Insufficient documentation

## 2020-05-29 DIAGNOSIS — R000 Tachycardia, unspecified: Secondary | ICD-10-CM | POA: Diagnosis not present

## 2020-05-29 DIAGNOSIS — Z9119 Patient's noncompliance with other medical treatment and regimen: Secondary | ICD-10-CM | POA: Insufficient documentation

## 2020-05-29 DIAGNOSIS — Z8249 Family history of ischemic heart disease and other diseases of the circulatory system: Secondary | ICD-10-CM | POA: Insufficient documentation

## 2020-05-29 DIAGNOSIS — Z7901 Long term (current) use of anticoagulants: Secondary | ICD-10-CM | POA: Insufficient documentation

## 2020-05-29 DIAGNOSIS — Z86718 Personal history of other venous thrombosis and embolism: Secondary | ICD-10-CM | POA: Insufficient documentation

## 2020-05-29 DIAGNOSIS — Z87891 Personal history of nicotine dependence: Secondary | ICD-10-CM | POA: Insufficient documentation

## 2020-05-29 DIAGNOSIS — G4730 Sleep apnea, unspecified: Secondary | ICD-10-CM | POA: Diagnosis not present

## 2020-05-29 DIAGNOSIS — M797 Fibromyalgia: Secondary | ICD-10-CM | POA: Insufficient documentation

## 2020-05-29 DIAGNOSIS — I110 Hypertensive heart disease with heart failure: Secondary | ICD-10-CM | POA: Insufficient documentation

## 2020-05-29 DIAGNOSIS — M48061 Spinal stenosis, lumbar region without neurogenic claudication: Secondary | ICD-10-CM | POA: Insufficient documentation

## 2020-05-29 DIAGNOSIS — I420 Dilated cardiomyopathy: Secondary | ICD-10-CM | POA: Insufficient documentation

## 2020-05-29 DIAGNOSIS — E785 Hyperlipidemia, unspecified: Secondary | ICD-10-CM | POA: Diagnosis not present

## 2020-05-29 DIAGNOSIS — Z96653 Presence of artificial knee joint, bilateral: Secondary | ICD-10-CM | POA: Insufficient documentation

## 2020-05-29 DIAGNOSIS — I5032 Chronic diastolic (congestive) heart failure: Secondary | ICD-10-CM | POA: Diagnosis not present

## 2020-05-29 DIAGNOSIS — I10 Essential (primary) hypertension: Secondary | ICD-10-CM

## 2020-05-29 DIAGNOSIS — I493 Ventricular premature depolarization: Secondary | ICD-10-CM

## 2020-05-29 DIAGNOSIS — K219 Gastro-esophageal reflux disease without esophagitis: Secondary | ICD-10-CM | POA: Diagnosis not present

## 2020-05-29 DIAGNOSIS — Z9049 Acquired absence of other specified parts of digestive tract: Secondary | ICD-10-CM | POA: Insufficient documentation

## 2020-05-29 DIAGNOSIS — G4733 Obstructive sleep apnea (adult) (pediatric): Secondary | ICD-10-CM

## 2020-05-29 DIAGNOSIS — Z885 Allergy status to narcotic agent status: Secondary | ICD-10-CM | POA: Diagnosis not present

## 2020-05-29 DIAGNOSIS — M316 Other giant cell arteritis: Secondary | ICD-10-CM | POA: Diagnosis not present

## 2020-05-29 DIAGNOSIS — Z791 Long term (current) use of non-steroidal anti-inflammatories (NSAID): Secondary | ICD-10-CM | POA: Insufficient documentation

## 2020-05-29 DIAGNOSIS — Z7952 Long term (current) use of systemic steroids: Secondary | ICD-10-CM | POA: Insufficient documentation

## 2020-05-29 NOTE — Patient Instructions (Signed)
Continue weighing daily and call for an overnight weight gain of > 2 pounds or a weekly weight gain of >5 pounds. 

## 2020-05-29 NOTE — Progress Notes (Signed)
Patient ID: Wendy Sanchez, female    DOB: Oct 20, 1950, 70 y.o.   MRN: [id number]  HPI  Ms Theissen is a 70 y/o female with a history of asthma, HTN, GERD, fibromyalgia, hyperlipidemia, spinal stenosis, COPD, DVT, PE, sleep apnea, temporal arteritis, previous tobacco use and chronic heart failure.   Echo report from 04/26/20 reviewed and showed an EF of 50% with normal PA Pressure of 27 mmHg.  RHC/ LHC done 08/12/16 and showed:  There is severe left ventricular systolic dysfunction.  LV end diastolic pressure is mildly elevated.  The left ventricular ejection fraction is less than 25% by visual estimate.  Hemodynamic findings consistent with moderate pulmonary hypertension.  LV end diastolic pressure is normal.  Normal coronaries  Moderately elevated pulmonary pressure   Conclusion Nonischemic dilated cardiomyopathy Severely depressed left ventricular function globally ejection fraction less than 25% No evidence of obstructive coronary disease in the right or left system Moderately elevated pulmonary pressures Wedge pressure of 37 mean A pressures of 51/29 mean of 40  Was in the ED 04/24/20 due to jaw pain and hypotension. CT scan showed TMJ osteoarthrosis. Started on high dose prednisone for temporal arteritis flare with improvement of her symptoms and she was released.    She presents today for her initial visit with a chief complaint of moderate shortness of breath with little exertion. She describes this as chronic in nature having been present for several years. She has associated fatigue, chest tightness, cough, pedal edema, head congestion, leg weakness, difficulty sleeping and chronic pain along with this. She denies any dizziness, abdominal distention, palpitations or chest pain.   She says that she's supposed to be wearing CPAP but that it makes her congestion every times she wears it so she hasn't been wearing it. She says that OT has been ordered for her to help regain  strength but that it hasn't started yet.   Past Medical History:  Diagnosis Date  . Allergic rhinitis   . Arthritis   . Asthma    problems with fumes and aerosols cause asthma  . Chest pain 10/19/2016  . CHF (congestive heart failure) ([place])   . Complication of anesthesia    awakens during surgery; has occurred with last 3-4 surgeries   . COPD (chronic obstructive pulmonary disease) ([place])   . DVT (deep venous thrombosis) ([place]) 07/13/2018   Right leg  . Environmental allergies    fumes   . Fibromyalgia   . GERD (gastroesophageal reflux disease)   . Headache   . Hemorrhoids   . History of bronchitis   . Hx of total knee arthroplasty 12/13/2015  . Hyperlipidemia   . Hypertension   . Morbid obesity with BMI of 45.0-49.9, adult ([place]) 07/25/2015  . NICM (nonischemic cardiomyopathy) ([place])    a. ? PVC mediated;  b. 06/2016 Echo: EF 25-30%, mild LVH, mild LAE, mild AI/MR/TR/PR; c. 08/2016 Cath: nl cors, EF 25%.  . Numbness    hands bilat when driving; improves when not preforming task   . Osteoarthritis of left knee 08/02/2015  . PE (pulmonary thromboembolism) ([place]) 07/13/2018  . Pes anserinus bursitis of left knee 05/18/2016  . Presence of right artificial knee joint 05/18/2016  . PVC (premature ventricular contraction) 06/04/2017  . PVC's (premature ventricular contractions)    a. 11/2016 [place] started;  b. 11/29/16 48h Holter: 89381 PVC's (41%); c. 12/2016 24h Holter: 18040 PVC's (15%); c. 02/2017 48h Holter: 37262 PVC's (41%).  . Sleep apnea    cannot tolerate  CPAP  . Spinal stenosis of lumbar region 06/19/2015  . Status post gastric banding   . 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 Temporal arteritis ([place]) 05/01/2020  . Tinnitus    comes and goes   . Unilateral primary osteoarthritis, right knee 12/13/2015  . Vertigo    none for over 2 yrs   Past Surgical History:  Procedure Laterality Date  . ABDOMINAL HYSTERECTOMY  1979   left ovary remains  . ABDOMINAL HYSTERECTOMY     . ARTERY BIOPSY Left 06/07/2019   Procedure: BIOPSY TEMPORAL ARTERY;  Surgeon: Algernon Huxley, MD;  Location: [facility] ORS;  Service: Vascular;  Laterality: Left;  . CHOLECYSTECTOMY    . COLONOSCOPY    . COLONOSCOPY N/A 10/30/2019   Procedure: COLONOSCOPY;  Surgeon: Lesly Rubenstein, MD;  Location: [facility] ENDOSCOPY;  Service: Endoscopy;  Laterality: N/A;  . ESOPHAGOGASTRODUODENOSCOPY N/A 10/30/2019   Procedure: ESOPHAGOGASTRODUODENOSCOPY (EGD);  Surgeon: Lesly Rubenstein, MD;  [facility];  Service: Endoscopy;  Laterality: N/A;  . fibrous tissue removed from right shoulder and back of neck      2 years ago   . FLOOR OF MOUTH BIOPSY N/A 02/28/2019   Procedure: SALIVARY GLAND BIOPSY;  Surgeon: Carloyn Manner, MD;  Location: [facility] ORS;  Service: ENT;  Laterality: N/A;  . FRACTURE SURGERY     left small finger  . GANGLION CYST EXCISION    . GASTRIC BYPASS  1980   states had bypass with banding and band left in -  . GASTRIC BYPASS OPEN    . JOINT REPLACEMENT Bilateral 2017   knees  . KNEE SURGERY Bilateral    both knees  2001  . NECK SURGERY N/A 1205/19   ganglion cyst removal, benigh   . PULMONARY THROMBECTOMY Bilateral 07/14/2018   Procedure: PULMONARY THROMBECTOMY;  Surgeon: Algernon Huxley, MD;  Location: [place] [facility];  Service: Cardiovascular;  Laterality: Bilateral;  . PVC ABLATION N/A 06/04/2017   Procedure: PVC ABLATION;  Surgeon: Evans Lance, MD;  Location: [place] [facility];  Service: Cardiovascular;  Laterality: N/A;  . RIGHT/LEFT HEART CATH AND CORONARY ANGIOGRAPHY Bilateral 08/12/2016   Procedure: Right/Left Heart Cath and Coronary Angiography;  Surgeon: Yolonda Kida, MD;  Location: [place] [facility];  Service: Cardiovascular;  Laterality: Bilateral;  . TONSILLECTOMY     age 12  . TOTAL KNEE ARTHROPLASTY Left 08/02/2015   Procedure: LEFT TOTAL KNEE ARTHROPLASTY;  Surgeon: Mcarthur Rossetti, MD;  Location: [facility] ORS;  Service: Orthopedics;   Laterality: Left;  . TOTAL KNEE ARTHROPLASTY Right 12/13/2015   Procedure: RIGHT TOTAL KNEE ARTHROPLASTY;  Surgeon: Mcarthur Rossetti, MD;  Location: [facility] ORS;  Service: Orthopedics;  Laterality: Right;   Family History  Problem Relation Age of Onset  . Hypertension Father   . Deep vein thrombosis Father   . Dementia Mother   . Diabetes Sister   . Congestive Heart Failure Sister   . Congestive Heart Failure Brother   . Congestive Heart Failure Daughter   . Congestive Heart Failure Son   . Breast cancer Maternal Aunt        great aunt   Social History   Tobacco Use  . Smoking status: Former Smoker    Packs/day: 0.25    Years: 50.00    Pack years: 12.50    Types: Cigarettes    Quit date: 07/06/2018    Years since quitting: 1.8  . Smokeless tobacco: Current User  Types: Chew  Substance Use Topics  . Alcohol use: No    Alcohol/week: 0.0 standard drinks   Allergies  Allergen Reactions  . Hydrocodone-Acetaminophen Swelling    hands  . Iodine Anaphylaxis and Swelling    Iv dye  . Other Other (See Comments)    ALLERGY TO METAL - BLACKENS SKIN AND CAUSES A RASH   . Tape Swelling and Other (See Comments)    Skin comes off.  Paper tape is ok tegaderm OK  . Peanut Oil Other (See Comments)    ** Nuts cause runny nose  . Shellfish Allergy Nausea And Vomiting and Swelling    Episode of GI infection after eating clam chowder. Still eats shrimp and other seafood   Prior to Admission medications   Medication Sig Start Date End Date Taking? Authorizing Provider  albuterol (PROVENTIL HFA;VENTOLIN HFA) 108 (90 Base) MCG/ACT inhaler Inhale 2 puffs into the lungs every 6 (six) hours as needed for wheezing or shortness of breath.   Yes [provider]  apixaban (ELIQUIS) 5 MG TABS tablet Take 1 tablet (5 mg total) by mouth 2 (two) times daily. 07/22/18 06/07/19 Yes Pyreddy, Reatha Harps, MD  Ascorbic Acid (VITAMIN C ADULT GUMMIES PO) Take by mouth. 2 gummies daily   Yes [provider]  Ascorbic Acid (VITAMIN C PO) Take 2 tablets by mouth daily.   Yes [provider]  azelastine (ASTELIN) 0.1 % nasal spray Place 2 sprays into both nostrils 2 (two) times daily as needed for allergies. Use in each nostril as directed   Yes [provider]  cyclobenzaprine (FLEXERIL) 10 MG tablet Take 10 mg by mouth at bedtime as needed for muscle spasms.   Yes [provider]  Dextromethorphan-guaiFENesin (CORICIDIN HBP CONGESTION/COUGH) 10-200 MG CAPS Take 2 tablets by mouth 2 (two) times daily as needed (cough / congestion).   Yes [provider]  dextromethorphan-guaiFENesin (MUCINEX DM) 30-600 MG 12hr tablet Take 1 tablet by mouth 2 (two) times daily as needed for cough.   Yes [provider]  diclofenac sodium (VOLTAREN) 1 % GEL Apply 2 g topically 4 (four) times daily as needed (pain).    Yes [provider]  diphenhydrAMINE (BENADRYL) 25 mg capsule Take 50 mg by mouth every other day.   Yes [provider]  ELDERBERRY PO 2 gummies daily   Yes [provider]  folic acid (FOLVITE) 1 MG tablet Take 1 mg by mouth at bedtime.    Yes [provider]  gabapentin (NEURONTIN) 300 MG capsule Take 2 capsules (600 mg total) by mouth See admin instructions. Take 600 mg by mouth twice daily, may take a third 600 mg dose as needed for pain 05/01/20 07/30/20 Yes Molli Barrows, MD  ipratropium-albuterol (DUONEB) 0.5-2.5 (3) MG/3ML SOLN Take 3 mLs by nebulization every 6 (six) hours as needed (shortness of breath).   Yes [provider]  levocetirizine (XYZAL) 5 MG tablet Take 5 mg by mouth at bedtime.    Yes [provider]  montelukast (SINGULAIR) 10 MG tablet Take 10 mg by mouth at bedtime.   Yes [provider]  Multiple Vitamins-Minerals (MULTIVITAMIN GUMMIES ADULTS) CHEW Chew 2 tablets by mouth daily.    Yes [provider]  nortriptyline (PAMELOR) 50 MG capsule Take 50 mg by  mouth at bedtime.   Yes [provider]  nystatin (MYCOSTATIN) 100000 UNIT/ML suspension Take 5 mLs by mouth 4 (four) times daily as needed (yeast).    Yes  [provider]  nystatin cream (MYCOSTATIN) Apply 1 application topically 2 (two) times daily as needed (yeast).   Yes [provider]  oxyCODONE-acetaminophen (PERCOCET) 10-325 MG tablet Take 1 tablet by mouth every 4 (four) hours as needed for pain. 06/10/20 07/10/20 Yes Molli Barrows, MD  pantoprazole (PROTONIX) 40 MG tablet Take 40 mg by mouth 2 (two) times daily.   Yes [provider]  Polyvinyl Alcohol-Povidone (REFRESH OP) Place 1 drop into both eyes daily as needed (dry eyes).   Yes [provider]  potassium chloride SA (K-DUR,KLOR-CON) 20 MEQ tablet Take 20 mEq by mouth daily.   Yes [provider]  predniSONE (DELTASONE) 10 MG tablet Take 60 mg by mouth daily with breakfast.   Yes [provider]  Probiotic Product (PROBIOTIC COLON SUPPORT) CAPS Take 1 capsule by mouth at bedtime.   Yes [provider]  sucralfate (CARAFATE) 1 G tablet Take 1 g by mouth 2 (two) times daily before a meal.   Yes [provider]  torsemide (DEMADEX) 20 MG tablet Take 2 tablets (40 mg) by mouth once daily 04/12/20  Yes Deboraha Sprang, MD  umeclidinium-vilanterol (ANORO ELLIPTA) 62.5-25 MCG/INH AEPB Inhale 1 puff into the lungs daily. 12/23/17  Yes [provider]  Vitamin D, Ergocalciferol, (DRISDOL) 50000 UNITS CAPS capsule Take 50,000 Units by mouth every Thursday.    Yes [provider]  Wheat Dextrin (BENEFIBER DRINK MIX) PACK Take by mouth. 1 packet daily   Yes [provider]  albuterol (PROVENTIL) (2.5 MG/3ML) 0.083% nebulizer solution Take 3 mLs (2.5 mg total) by nebulization every 6 (six) hours as needed for wheezing or shortness of breath. Patient not taking: Reported on 05/29/2020 01/19/20   Rodriguez-Southworth, Sunday Spillers, PA-C  amoxicillin  (AMOXIL) 500 MG tablet Take 1,000 mg by mouth 2 (two) times daily. Patient not taking: Reported on 05/29/2020    [provider]  carvedilol (COREG) 6.25 MG tablet Take 1 tablet (6.25 mg total) by mouth 2 (two) times daily. Patient not taking: Reported on 05/29/2020 07/28/18   Deboraha Sprang, MD  fluconazole (DIFLUCAN) 100 MG tablet Take 150 mg by mouth. MWF PRN    [provider]  mirabegron ER (MYRBETRIQ) 50 MG TB24 tablet Take 50 mg by mouth daily. Patient not taking: Reported on 05/29/2020    [provider]  Nirmatrelvir & Ritonavir 20 x 150 MG & 10 x 100MG  TBPK TAKE 2 NIRMATRELVIR TABLETS AND TAKE 1 RITONAVIR TABLET BY MOUTH TWICE DAILY FOR 5 DAYS Patient not taking: No sig reported 01/21/20 01/20/21  Jacquelin Hawking, NP  sacubitril-valsartan (ENTRESTO) 49-51 MG Take 1 tablet by mouth 2 (two) times daily. Patient not taking: Reported on 05/29/2020    [provider]   Review of Systems  Constitutional: Positive for fatigue (tire easily). Negative for appetite change.  HENT: Positive for congestion. Negative for rhinorrhea and sore throat.   Eyes: Negative.   Respiratory: Positive for cough (dry), chest tightness ("discomfort") and shortness of breath.   Cardiovascular: Positive for leg swelling (at times). Negative for chest pain and palpitations.  Gastrointestinal: Negative for abdominal distention and abdominal pain.  Endocrine: Negative.   Genitourinary: Negative.   Musculoskeletal: Positive for arthralgias (right shoulder) and neck pain.  Skin: Negative.   Allergic/Immunologic: Negative.   Neurological: Positive for weakness (leg weakness). Negative for dizziness and light-headedness.  Hematological: Negative for adenopathy. Does not bruise/bleed easily.  Psychiatric/Behavioral: Positive for sleep disturbance (sleeping on 2 pillow with  HOB). Negative for dysphoric mood. The patient is not nervous/anxious.    Vitals:   05/29/20 1435  BP: (!) 147/84   Pulse: (!) 118  Resp: 20  SpO2: 94%  Weight: 297 lb 4 oz (134.8 kg)  Height: 5\' 7"  (1.702 m)   Wt Readings from Last 3 Encounters:  05/29/20 297 lb 4 oz (134.8 kg)  04/18/20 297 lb (134.7 kg)  04/11/20 (!) 307 lb 3.2 oz (139.3 kg)   Lab Results  Component Value Date   CREATININE 1.00 04/18/2020   CREATININE 0.74 04/11/2020   CREATININE 0.85 06/07/2019    Physical Exam Vitals and nursing note reviewed.  Constitutional:      Appearance: Normal appearance.  HENT:     Head: Normocephalic and atraumatic.  Cardiovascular:     Rate and Rhythm: Regular rhythm. Tachycardia present.  Pulmonary:     Effort: Pulmonary effort is normal. No respiratory distress.     Breath sounds: No wheezing or rales.  Abdominal:     General: There is no distension.     Palpations: Abdomen is soft.     Tenderness: There is no abdominal tenderness.  Musculoskeletal:        General: No tenderness.     Cervical back: Normal range of motion and neck supple.     Right lower leg: Edema (2+ pitting) present.     Left lower leg: Edema (2+ pitting) present.  Skin:    General: Skin is warm and dry.  Neurological:     General: No focal deficit present.     Mental Status: She is alert and oriented to person, place, and time.  Psychiatric:        Mood and Affect: Mood normal.        Behavior: Behavior normal.        Thought Content: Thought content normal.    Assessment & Plan:  1: Chronic heart failure with preserved ejection fraction without structural changes- - NYHA class III - euvolemic today - weighing daily and home weight chart reviewed; weight does fluctuate and she was instructed to call for an overnight weight gain of > 2 pounds or a weekly weight gain of > 5 pounds - saw cardiology Clayborn Bigness) 05/14/20; returns August - tachycardic here but home HR log reviewed and ranges from 80's-90's; cardiology currently has carvedilol and entresto on hold  - not adding salt and is using Mrs Deliah Boston for  seasoning; reading food labels for sodium content - she says that OT has been ordered and she's waiting for it to start as her legs "just give out as times" - BNP 07/13/2018 was 564.0 - PharmD reconciled medications  2: HTN- - BP mildly elevated today but carvedilol and entresto are currently on hold - home BP log reviewed and BP ranges from mid 90's-mid 100's/ 50-70's - saw previous PCP Kary Kos) 09/04/19; now seeing Dr. Ronnald Ramp @ [facility] and returns July - [facility] 04/18/20 reviewed and showed sodium 136, potassium 4.7, creatinine 1.00 and GFR >60  3: COPD- - saw pulmonology Raul Del) 10/23/19  4: Sleep apnea- - supposed to be wearing CPAP but she says that it makes her so congested that she can't wear it - she says that she may try wearing it again  5: PVC's- - saw EP Caryl Comes) 04/11/20  6: Lymphedema- - stage 2 - limited in her ability to exercise due to weakness - difficulty in getting compression socks on but she will try again; reviewed to put them on every  morning with removal at bedtime - could try ACE wraps if she can't get the socks on - elevate legs when sitting for long periods of time - consider lymphapress compression boots if edema persists   Medication bottles reviewed.   Return in 6 weeks or sooner for any questions/problems before then.

## 2020-05-29 NOTE — Progress Notes (Signed)
[facility] - PHARMACIST COUNSELING NOTE  ADHERENCE ASSESSMENT  Adherence strategy: Pt uses a pill box to remain adherent.    Do you ever forget to take your medication? [] Yes (1) [x] No (0)  Do you ever skip doses due to side effects? [] Yes (1) [x] No (0)  Do you have trouble affording your medicines? [] Yes (1) [x] No (0)  Are you ever unable to pick up your medication due to transportation difficulties? [] Yes (1) [x] No (0)  Do you ever stop taking your medications because you don't believe they are helping? [] Yes (1) [x] No (0)    Guideline-Directed Medical Therapy/Evidence Based Medicine  ACE/ARB/ARNI: entresto on hold Beta Blocker: carvedilol on hold Aldosterone Antagonist: none Diuretic: torsemide  SUBJECTIVE  HPI: 70yo female who is here for a follow up appointment for heart failure. Pt stated she was winded coming in (may explain her elevated vitals upon arrival). Pt has not been taking carvedilol and Entresto as instructed by her cardiologist due to low BP (she brought in a BP log with her). She checks her BP and weight daily and reported a 10-15 pound weight loss in one day from her torsemide. Pt did mention concerns about a new medication Dr. Posey Pronto wanted to start her on (Actemra) - addressed with patient. Pt brought in all of her medication bottles with her today. Pt also reports finishing an antibiotic course for H. Pylori. Pt reports switching colace to benefiber for constipation.    Past Medical History:  Diagnosis Date  . Allergic rhinitis   . Arthritis   . Asthma    problems with fumes and aerosols cause asthma  . Chest pain 10/19/2016  . CHF (congestive heart failure) ([place])   . Complication of anesthesia    awakens during surgery; has occurred with last 3-4 surgeries   . COPD (chronic obstructive pulmonary disease) ([place])   . DVT (deep venous thrombosis) ([place]) 07/13/2018   Right leg  . Environmental allergies    fumes    . Fibromyalgia   . GERD (gastroesophageal reflux disease)   . Headache   . Hemorrhoids   . History of bronchitis   . Hx of total knee arthroplasty 12/13/2015  . Hyperlipidemia   . Hypertension   . Morbid obesity with BMI of 45.0-49.9, adult ([place]) 07/25/2015  . NICM (nonischemic cardiomyopathy) ([place])    a. ? PVC mediated;  b. 06/2016 Echo: EF 25-30%, mild LVH, mild LAE, mild AI/MR/TR/PR; c. 08/2016 Cath: nl cors, EF 25%.  . Numbness    hands bilat when driving; improves when not preforming task   . Osteoarthritis of left knee 08/02/2015  . PE (pulmonary thromboembolism) ([place]) 07/13/2018  . Pes anserinus bursitis of left knee 05/18/2016  . Presence of right artificial knee joint 05/18/2016  . PVC (premature ventricular contraction) 06/04/2017  . PVC's (premature ventricular contractions)    a. 11/2016 Amio started;  b. 11/29/16 48h Holter: [id number] PVC's (41%); c. 12/2016 24h Holter: 18040 PVC's (15%); c. 02/2017 48h Holter: 37262 PVC's (41%).  . Sleep apnea    cannot tolerate CPAP  . Spinal stenosis of lumbar region 06/19/2015  . Status post gastric banding   . 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 Temporal arteritis ([place]) 05/01/2020  . Tinnitus    comes and goes   . Unilateral primary osteoarthritis, right knee 12/13/2015  . Vertigo    none for over 2 yrs        OBJECTIVE   Vital signs:  HR 118, BP 147/84, weight (pounds) 297 ECHO: Date 04/26/20, EF 50% RHC/ LHC done 08/12/16 and showed:  There is severe left ventricular systolic dysfunction.  LV end diastolic pressure is mildly elevated.  The left ventricular ejection fraction is less than 25% by visual estimate.  Hemodynamic findings consistent with moderate pulmonary hypertension.  LV end diastolic pressure is normal.  Normal coronaries  Moderately elevated pulmonary pressure  BMP Latest Ref Rng & Units 04/18/2020 04/11/2020 06/07/2019  Glucose 70 - 99 mg/dL 113(H) 102(H) 93  BUN 8 - 23 mg/dL 22 11 19   Creatinine  0.44 - 1.00 mg/dL 1.00 0.74 0.85  BUN/Creat Ratio 12 - 28 - 15 -  Sodium 135 - 145 mmol/L 136 140 139  Potassium 3.5 - 5.1 mmol/L 4.7 5.1 3.7  Chloride 98 - 111 mmol/L 98 102 101  CO2 22 - 32 mmol/L 32 24 30  Calcium 8.9 - 10.3 mg/dL 8.6(L) 9.0 8.9    ASSESSMENT/PLAN Chronic heart failure with preserved ejection fraction  -pt's was instructed by Dr. Clayborn Bigness to stop taking carvedilol and Entresto on 5/3 due to low BP. Pt brought in BP log which shows SBP 100s and DBP 60s. -consider restarting carvedilol and Entresto at lower doses as BP allows -continue torsemide 40mg  daily  HTN -pt BP have been running low (see CHF) -continue torsemide  COPD -pt reported taking albuterol daily; inquired if pt needed to take it daily - pt informed me she thought this is how she is supposed to take it. Educated pt on when to appropriately use albuterol inhaler -continue Anoro inhaler and PRN duonebs  Temporal arthritis -continue prednisone 60mg  daily -pt was previously on 3mg  daily however was increased back up due to increased inflammation. She inquired about a new medication that Dr. Posey Pronto wanted to start her on (Actemra): educated pt on common side effects, addressed concerns, and ultimately stated that it is her choice if she wants to switch to this medication and I informed her of the long term side effects of chronic high dose steroids  Hx/recurrent yeast infections -continue fluconazole, nystatin (oral and topical), and probiotics  Chronic pain -continue gabapentin, nortriptyline, and PRN oxycodone-acetaminophen  Time spent: 20 minutes  Sherilyn Banker, PharmD Pharmacy Resident  05/29/2020 3:11 PM    Current Outpatient Medications:  .  albuterol (PROVENTIL HFA;VENTOLIN HFA) 108 (90 Base) MCG/ACT inhaler, Inhale 2 puffs into the lungs every 6 (six) hours as needed for wheezing or shortness of breath., Disp: , Rfl:  .  albuterol (PROVENTIL) (2.5 MG/3ML) 0.083% nebulizer solution, Take  3 mLs (2.5 mg total) by nebulization every 6 (six) hours as needed for wheezing or shortness of breath. (Patient not taking: Reported on 05/29/2020), Disp: 75 mL, Rfl: 0 .  amoxicillin (AMOXIL) 500 MG tablet, Take 1,000 mg by mouth 2 (two) times daily. (Patient not taking: Reported on 05/29/2020), Disp: , Rfl:  .  apixaban (ELIQUIS) 5 MG TABS tablet, Take 1 tablet (5 mg total) by mouth 2 (two) times daily., Disp: 60 tablet, Rfl: 0 .  Ascorbic Acid (VITAMIN C ADULT GUMMIES PO), Take by mouth. 2 gummies daily, Disp: , Rfl:  .  Ascorbic Acid (VITAMIN C PO), Take 2 tablets by mouth daily., Disp: , Rfl:  .  azelastine (ASTELIN) 0.1 % nasal spray, Place 2 sprays into both nostrils 2 (two) times daily as needed for allergies. Use in each nostril as directed, Disp: , Rfl:  .  carvedilol (COREG) 6.25 MG tablet, Take 1 tablet (6.25  mg total) by mouth 2 (two) times daily. (Patient not taking: Reported on 05/29/2020), Disp: 180 tablet, Rfl: 3 .  cyclobenzaprine (FLEXERIL) 10 MG tablet, Take 10 mg by mouth at bedtime as needed for muscle spasms., Disp: , Rfl:  .  Dextromethorphan-guaiFENesin (CORICIDIN HBP CONGESTION/COUGH) 10-200 MG CAPS, Take 2 tablets by mouth 2 (two) times daily as needed (cough / congestion)., Disp: , Rfl:  .  dextromethorphan-guaiFENesin (MUCINEX DM) 30-600 MG 12hr tablet, Take 1 tablet by mouth 2 (two) times daily as needed for cough., Disp: , Rfl:  .  diclofenac sodium (VOLTAREN) 1 % GEL, Apply 2 g topically 4 (four) times daily as needed (pain). , Disp: , Rfl:  .  diphenhydrAMINE (BENADRYL) 25 mg capsule, Take 50 mg by mouth every other day., Disp: , Rfl:  .  ELDERBERRY PO, 2 gummies daily, Disp: , Rfl:  .  fluconazole (DIFLUCAN) 100 MG tablet, Take 150 mg by mouth. MWF PRN, Disp: , Rfl:  .  folic acid (FOLVITE) 1 MG tablet, Take 1 mg by mouth at bedtime. , Disp: , Rfl:  .  gabapentin (NEURONTIN) 300 MG capsule, Take 2 capsules (600 mg total) by mouth See admin instructions. Take 600 mg by  mouth twice daily, may take a third 600 mg dose as needed for pain, Disp: 180 capsule, Rfl: 2 .  ipratropium-albuterol (DUONEB) 0.5-2.5 (3) MG/3ML SOLN, Take 3 mLs by nebulization every 6 (six) hours as needed (shortness of breath)., Disp: , Rfl:  .  levocetirizine (XYZAL) 5 MG tablet, Take 5 mg by mouth at bedtime. , Disp: , Rfl:  .  mirabegron ER (MYRBETRIQ) 50 MG TB24 tablet, Take 50 mg by mouth daily. (Patient not taking: Reported on 05/29/2020), Disp: , Rfl:  .  montelukast (SINGULAIR) 10 MG tablet, Take 10 mg by mouth at bedtime., Disp: , Rfl:  .  Multiple Vitamins-Minerals (MULTIVITAMIN GUMMIES ADULTS) CHEW, Chew 2 tablets by mouth daily. , Disp: , Rfl:  .  Nirmatrelvir & Ritonavir 20 x 150 MG & 10 x 100MG  TBPK, TAKE 2 NIRMATRELVIR TABLETS AND TAKE 1 RITONAVIR TABLET BY MOUTH TWICE DAILY FOR 5 DAYS, Disp: 30 each, Rfl: 0 .  nortriptyline (PAMELOR) 50 MG capsule, Take 50 mg by mouth at bedtime., Disp: , Rfl:  .  nystatin (MYCOSTATIN) 100000 UNIT/ML suspension, Take 5 mLs by mouth 4 (four) times daily as needed (yeast). , Disp: , Rfl:  .  nystatin cream (MYCOSTATIN), Apply 1 application topically 2 (two) times daily as needed (yeast)., Disp: , Rfl:  .  [START ON 06/10/2020] oxyCODONE-acetaminophen (PERCOCET) 10-325 MG tablet, Take 1 tablet by mouth every 4 (four) hours as needed for pain., Disp: 150 tablet, Rfl: 0 .  pantoprazole (PROTONIX) 40 MG tablet, Take 40 mg by mouth 2 (two) times daily., Disp: , Rfl:  .  Polyvinyl Alcohol-Povidone (REFRESH OP), Place 1 drop into both eyes daily as needed (dry eyes)., Disp: , Rfl:  .  potassium chloride SA (K-DUR,KLOR-CON) 20 MEQ tablet, Take 20 mEq by mouth daily., Disp: , Rfl:  .  predniSONE (DELTASONE) 10 MG tablet, Take 60 mg by mouth daily with breakfast., Disp: , Rfl:  .  Probiotic Product (PROBIOTIC COLON SUPPORT) CAPS, Take 1 capsule by mouth at bedtime., Disp: , Rfl:  .  sacubitril-valsartan (ENTRESTO) 49-51 MG, Take 1 tablet by mouth 2 (two) times  daily. (Patient not taking: Reported on 05/29/2020), Disp: , Rfl:  .  sucralfate (CARAFATE) 1 G tablet, Take 1 g by mouth 2 (two) times  daily before a meal., Disp: , Rfl:  .  torsemide (DEMADEX) 20 MG tablet, Take 2 tablets (40 mg) by mouth once daily, Disp: 60 tablet, Rfl: 3 .  umeclidinium-vilanterol (ANORO ELLIPTA) 62.5-25 MCG/INH AEPB, Inhale 1 puff into the lungs daily., Disp: , Rfl:  .  Vitamin D, Ergocalciferol, (DRISDOL) 50000 UNITS CAPS capsule, Take 50,000 Units by mouth every Thursday. , Disp: , Rfl:  .  Wheat Dextrin (BENEFIBER DRINK MIX) PACK, Take by mouth. 1 packet daily, Disp: , Rfl:    COUNSELING POINTS/CLINICAL PEARLS Carvedilol (Goal: weight less than 85 kg is 25 mg BID, weight greater than 85 kg is 50 mg BID)  Patient should avoid activities requiring coordination until drug effects are realized, as drug may cause dizziness.  This drug may cause diarrhea, nausea, vomiting, arthralgia, back pain, myalgia, headache, vision disorder, erectile dysfunction, reduced libido, or fatigue.  Instruct patient to report signs/symptoms of adverse cardiovascular effects such as hypotension (especially in elderly patients), arrhythmias, syncope, palpitations, angina, or edema.  Drug may mask symptoms of hypoglycemia. Advise diabetic patients to carefully monitor blood sugar levels.  Patient should take drug with food.  Advise patient against sudden discontinuation of drug. Entresto (Goal: 97/103 mg twice daily)  Warn female patient to avoid pregnancy during therapy and to report a pregnancy to a physician.  Advise patient to report symptomatic hypotension.  Side effects may include hyperkalemia, cough, dizziness, or renal failure Torsemide  Side effects may include excessive urination.  Tell patient to report symptoms of ototoxicity.  Instruct patient to report lightheadedness or syncope.  Warn patient to avoid use of nonprescription NSAID products without first discussing it with their  healthcare provider.  DRUGS TO AVOID IN HEART FAILURE  Drug or Class Mechanism  Analgesics . NSAIDs . COX-2 inhibitors . Glucocorticoids  Sodium and water retention, increased systemic vascular resistance, decreased response to diuretics   Diabetes Medications . Metformin . Thiazolidinediones o Rosiglitazone (Avandia) o Pioglitazone (Actos) . DPP4 Inhibitors o Saxagliptin (Onglyza) o Sitagliptin (Januvia)   Lactic acidosis Possible calcium channel blockade   Unknown  Antiarrhythmics . Class I  o Flecainide o Disopyramide . Class III o Sotalol . Other o Dronedarone  Negative inotrope, proarrhythmic   Proarrhythmic, beta blockade  Negative inotrope  Antihypertensives . Alpha Blockers o Doxazosin . Calcium Channel Blockers o Diltiazem o Verapamil o Nifedipine . Central Alpha Adrenergics o Moxonidine . Peripheral Vasodilators o Minoxidil  Increases renin and aldosterone  Negative inotrope    Possible sympathetic withdrawal  Unknown  Anti-infective . Itraconazole . Amphotericin B  Negative inotrope Unknown  Hematologic . Anagrelide . Cilostazol   Possible inhibition of PD IV Inhibition of PD III causing arrhythmias  Neurologic/Psychiatric . Stimulants . Anti-Seizure Drugs o Carbamazepine o Pregabalin . Antidepressants o Tricyclics o Citalopram . Parkinsons o Bromocriptine o Pergolide o Pramipexole . Antipsychotics o Clozapine . Antimigraine o Ergotamine o Methysergide . Appetite suppressants . Bipolar o Lithium  Peripheral alpha and beta agonist activity  Negative inotrope and chronotrope Calcium channel blockade  Negative inotrope, proarrhythmic Dose-dependent QT prolongation  Excessive serotonin activity/valvular damage Excessive serotonin activity/valvular damage Unknown  IgE mediated hypersensitivy, calcium channel blockade  Excessive serotonin activity/valvular damage Excessive serotonin activity/valvular  damage Valvular damage  Direct myofibrillar degeneration, adrenergic stimulation  Antimalarials . Chloroquine . Hydroxychloroquine Intracellular inhibition of lysosomal enzymes  Urologic Agents . Alpha Blockers o Doxazosin o Prazosin o Tamsulosin o Terazosin  Increased renin and aldosterone  Adapted from Page RL, et al. "Drugs That  May Cause or Exacerbate Heart Failure: A Scientific Statement from the American Heart  Association." Circulation 2016; [id number]. DOI: 10.1161/CIR.0000000000000426   MEDICATION ADHERENCES TIPS AND STRATEGIES 1. Taking medication as prescribed improves patient outcomes in heart failure (reduces hospitalizations, improves symptoms, increases survival) 2. Side effects of medications can be managed by decreasing doses, switching agents, stopping drugs, or adding additional therapy. Please let someone in the [facility] know if you have having bothersome side effects so we can modify your regimen. Do not alter your medication regimen without talking to [place].  3. Medication reminders can help patients remember to take drugs on time. If you are missing or forgetting doses you can try linking behaviors, using pill boxes, or an electronic reminder like an alarm on your phone or an app. Some people can also get automated phone calls as medication reminders.

## 2020-06-17 IMAGING — MG DIGITAL SCREENING BILAT W/ TOMO W/ CAD
6 of 10 series · 6 of 30 positions shown · non-contrast
Comparison: Previous exam(s).

ACR Breast Density Category a: The breast tissue is almost entirely
fatty.

CLINICAL DATA: Screening.

EXAM:
DIGITAL SCREENING BILATERAL MAMMOGRAM WITH TOMO AND CAD

[L CC synth-2D]
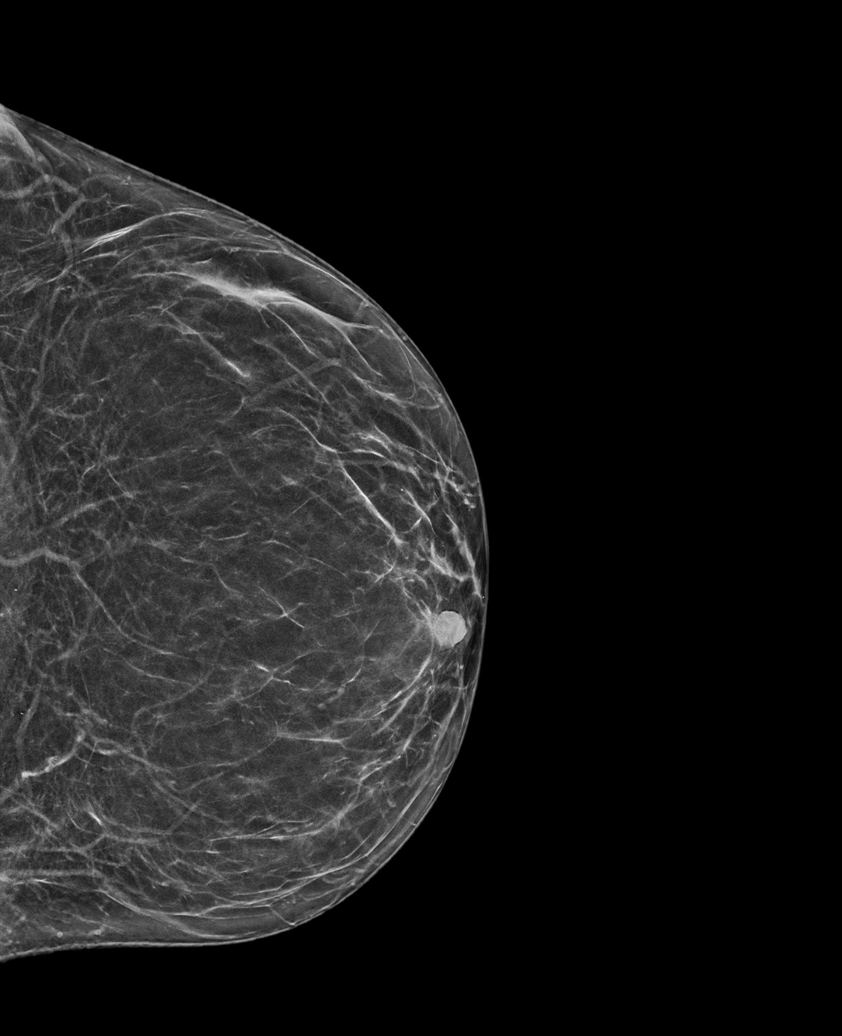

[R MLO synth-2D (1 of 2)]
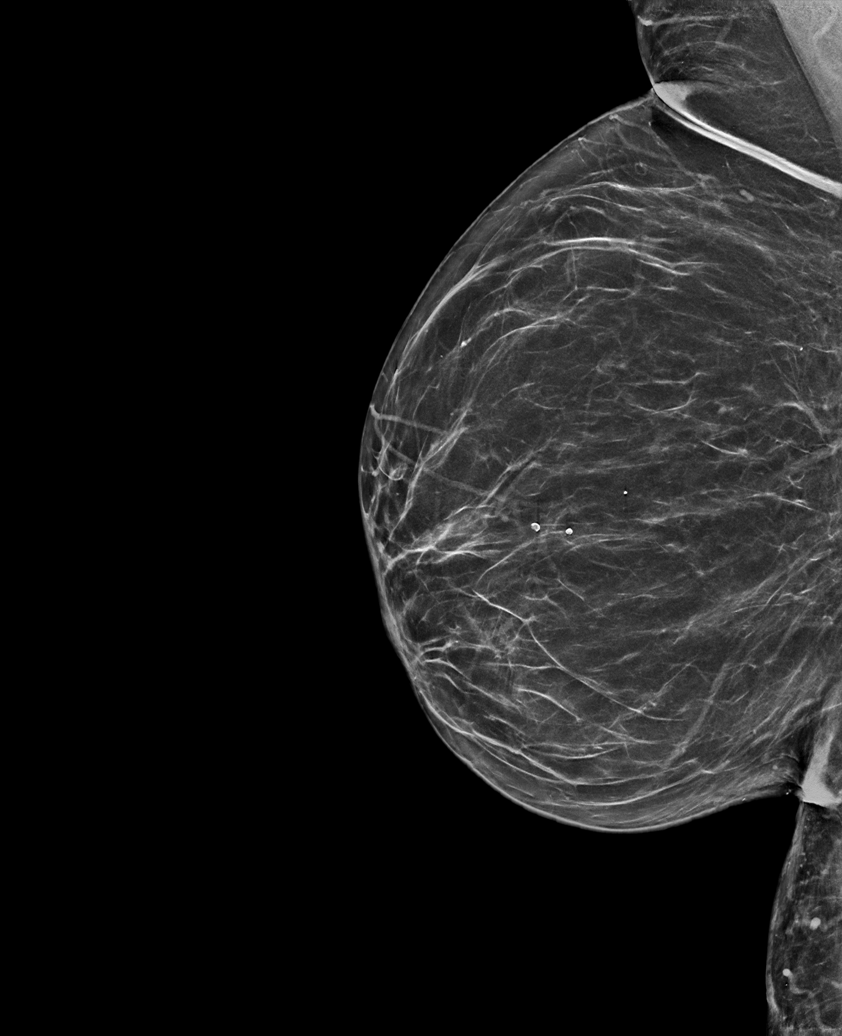

[L MLO synth-2D]
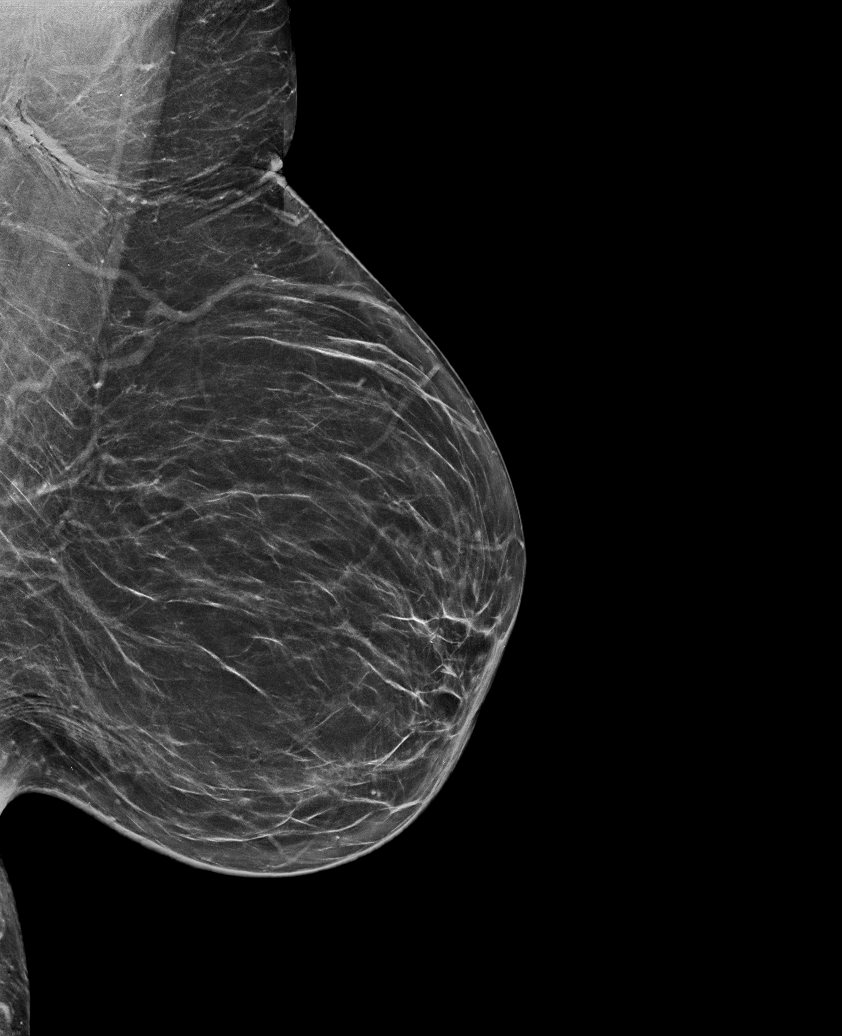

[R CC synth-2D]
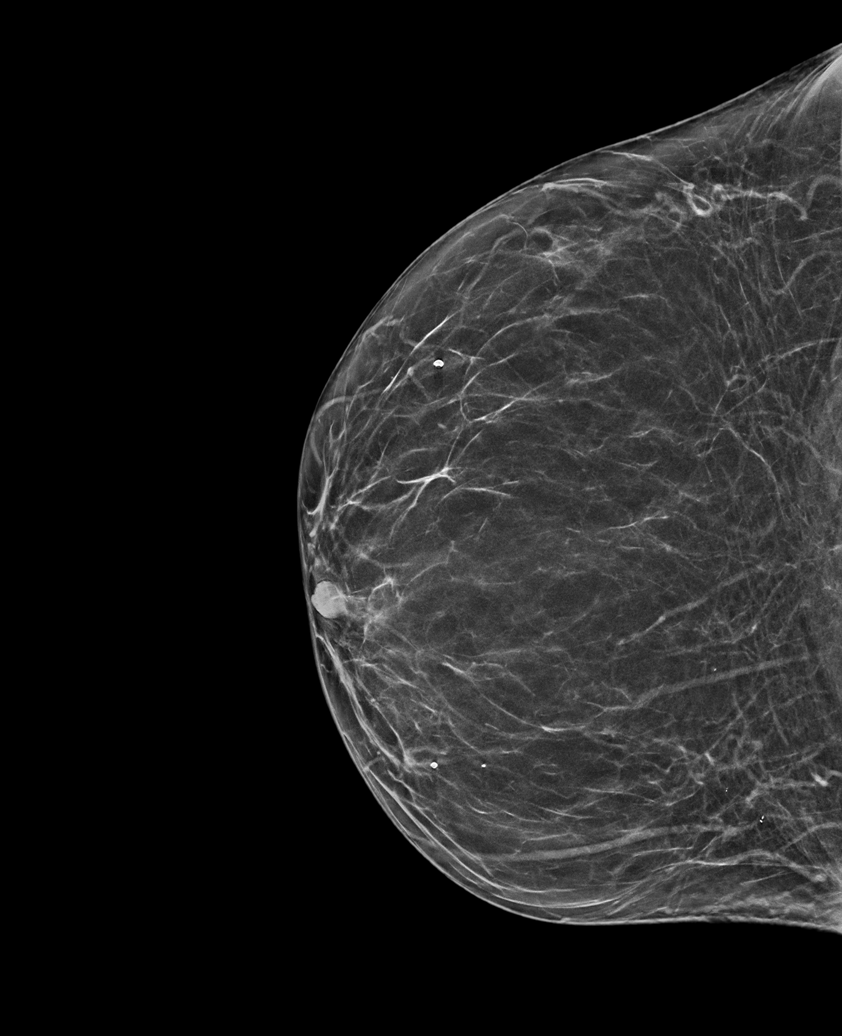

[R MLO synth-2D (2 of 2)]
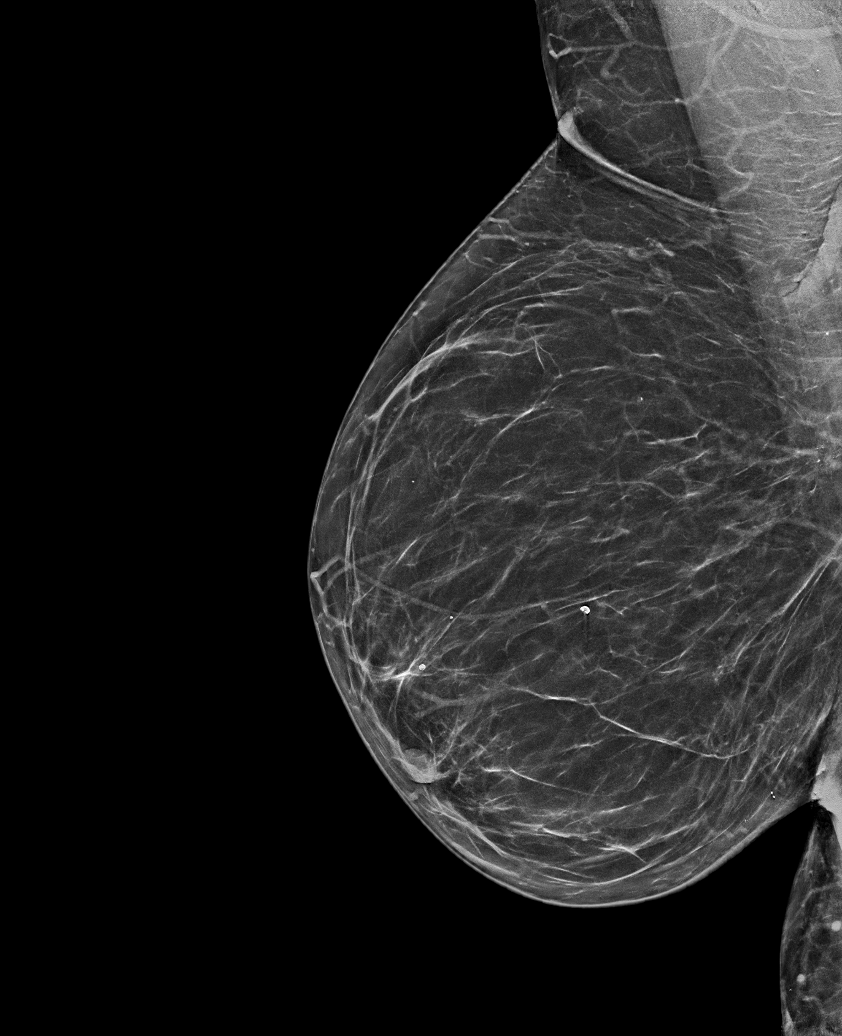

[L CC tomo · tomo slice 31/60.0]
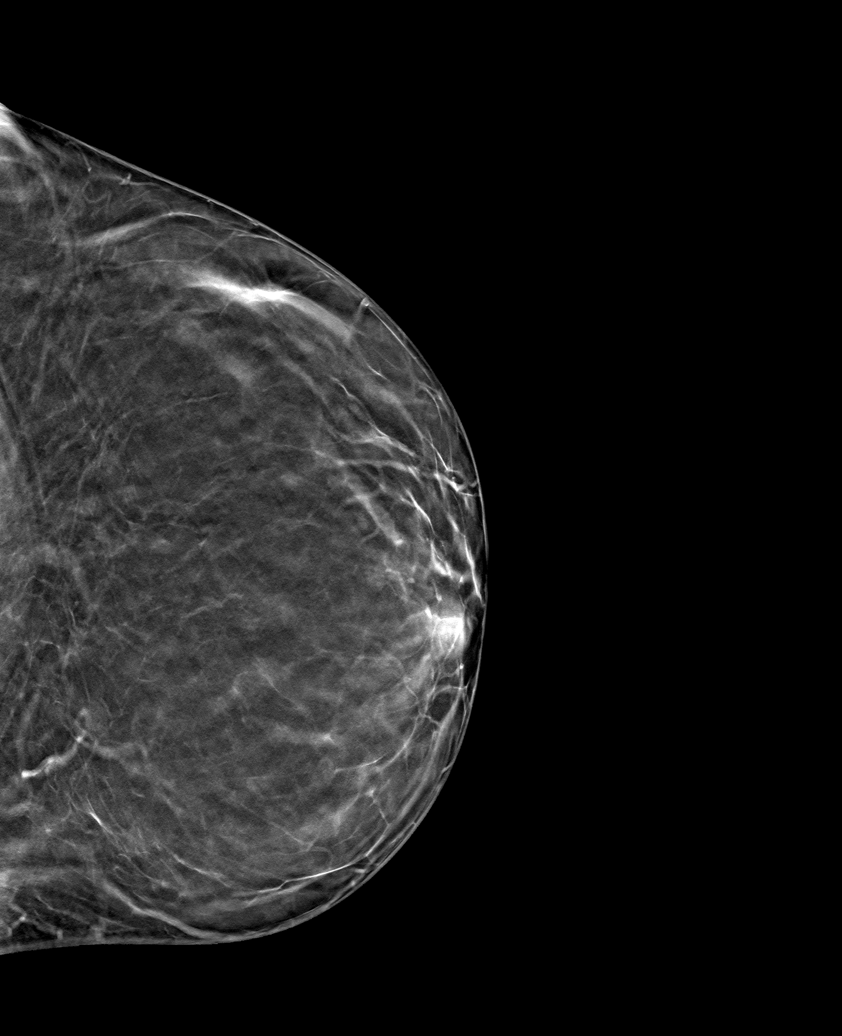

[6 of 30 positions shown; findings below may reference images not displayed]

FINDINGS: There are no findings suspicious for malignancy. Images were
processed with CAD.
IMPRESSION: No mammographic evidence of malignancy. A result letter of this
screening mammogram will be mailed directly to the patient.

RECOMMENDATION:
Screening mammogram in one year. (Code:[id number])

BI-RADS CATEGORY  1: Negative.

## 2020-06-19 ENCOUNTER — Ambulatory Visit
Admission: RE | Admit: 2020-06-19 | Discharge: 2020-06-19 | Disposition: A | Discharge status: Home / Self Care | Payer: Medicare Other | Source: Ambulatory Visit | Attending: Family Medicine | Admitting: Family Medicine

## 2020-06-19 ENCOUNTER — Other Ambulatory Visit: Payer: Self-pay

## 2020-06-19 DIAGNOSIS — N6331 Unspecified lump in axillary tail of the right breast: Secondary | ICD-10-CM

## 2020-06-21 ENCOUNTER — Other Ambulatory Visit: Payer: Medicare Other

## 2020-07-01 ENCOUNTER — Telehealth (INDEPENDENT_AMBULATORY_CARE_PROVIDER_SITE_OTHER): Payer: Self-pay | Admitting: Vascular Surgery

## 2020-07-01 ENCOUNTER — Ambulatory Visit: Payer: Medicare Other | Attending: Anesthesiology | Admitting: Anesthesiology

## 2020-07-01 ENCOUNTER — Other Ambulatory Visit: Payer: Self-pay

## 2020-07-01 NOTE — Telephone Encounter (Signed)
Called stated that she is having fluid in her legs causing her ankles to swell. Patient state that this morning she was not able to open her eyelid, having hand cramps, she's noticed her face is swollen as well. Tremors has gotten worse. Patient was last seen as a 2 week f/u from temporal arteritis.

## 2020-07-01 NOTE — Telephone Encounter (Signed)
Patient was made aware with medical recommendations and verbalized understanding 

## 2020-07-01 NOTE — Telephone Encounter (Signed)
She needs to see her PCP/rheumatologist.   We only did the biopsy we are not managing the temporal arteritis.  Many of these things can be side effects from her steroids

## 2020-07-03 ENCOUNTER — Ambulatory Visit: Payer: Medicare Other | Attending: Anesthesiology | Admitting: Anesthesiology

## 2020-07-03 ENCOUNTER — Telehealth: Payer: Self-pay

## 2020-07-03 ENCOUNTER — Other Ambulatory Visit: Payer: Self-pay

## 2020-07-03 ENCOUNTER — Encounter: Payer: Self-pay | Admitting: Anesthesiology

## 2020-07-03 DIAGNOSIS — F1190 Opioid use, unspecified, uncomplicated: Secondary | ICD-10-CM

## 2020-07-03 DIAGNOSIS — M25551 Pain in right hip: Secondary | ICD-10-CM

## 2020-07-03 DIAGNOSIS — M25552 Pain in left hip: Secondary | ICD-10-CM

## 2020-07-03 DIAGNOSIS — M5442 Lumbago with sciatica, left side: Secondary | ICD-10-CM | POA: Diagnosis not present

## 2020-07-03 DIAGNOSIS — G8929 Other chronic pain: Secondary | ICD-10-CM

## 2020-07-03 DIAGNOSIS — M5441 Lumbago with sciatica, right side: Secondary | ICD-10-CM

## 2020-07-03 DIAGNOSIS — M5136 Other intervertebral disc degeneration, lumbar region: Secondary | ICD-10-CM | POA: Diagnosis not present

## 2020-07-03 DIAGNOSIS — M25511 Pain in right shoulder: Secondary | ICD-10-CM

## 2020-07-03 DIAGNOSIS — G894 Chronic pain syndrome: Secondary | ICD-10-CM | POA: Diagnosis not present

## 2020-07-03 DIAGNOSIS — M48062 Spinal stenosis, lumbar region with neurogenic claudication: Secondary | ICD-10-CM

## 2020-07-03 DIAGNOSIS — M1712 Unilateral primary osteoarthritis, left knee: Secondary | ICD-10-CM

## 2020-07-03 DIAGNOSIS — M316 Other giant cell arteritis: Secondary | ICD-10-CM

## 2020-07-03 DIAGNOSIS — M25519 Pain in unspecified shoulder: Secondary | ICD-10-CM | POA: Insufficient documentation

## 2020-07-03 MED ORDER — OXYCODONE-ACETAMINOPHEN 10-325 MG PO TABS: 1.0000 | ORAL_TABLET | ORAL | 0 refills | Status: AC | PRN

## 2020-07-03 MED ORDER — GABAPENTIN 300 MG PO CAPS: 600.0000 mg | ORAL_CAPSULE | ORAL | 2 refills | Status: DC

## 2020-07-03 NOTE — Telephone Encounter (Signed)
Pt states that she did not receive her phone call from Dr Andree Elk on 07/01/20 for med refill

## 2020-07-03 NOTE — Progress Notes (Signed)
Virtual Visit via Telephone Note  I connected with Wendy Sanchez on 07/03/20 at  3:45 PM EDT by telephone and verified that I am speaking with the correct person using two identifiers.  Location: Patient: Home Provider: [facility]   I discussed the limitations, risks, security and privacy concerns of performing an evaluation and management service by telephone and the availability of in person appointments. I also discussed with the patient that there may be a patient responsible charge related to this service. The patient expressed understanding and agreed to proceed.   History of Present Illness:  I spoke with Wendy Sanchez today via telephone as she was unable to do the video portion of the virtual conference.  She has had quite a bit of trouble recently with diffuse body pain and recently injured her shoulder and has a rotator cuff tear additionally.  She is contemplating possibly a injection for that and ultimately may require surgery.  She continues to have baseline knee pain hip pain and low back pain.  This pain has been stable in nature with no significant changes.  Furthermore she has been on high-dose prednisone secondary to her temporal arteritis and she is needed to wean from this because of some facial swelling.  She has had a lot of spasming and this is primarily been present in the hands and calves.  Otherwise no change in strength is noted but she has been quite miserable.  She gets relief with her Percocet however she rates this as less effective than previously.  She cannot tolerate anti-inflammatories and really has had limited other options.  No other changes are noted at this time.  Review of systems: General: No fevers chills Cardiac: No angina or palpitations: Pulmonary: No shortness of breath or dyspnea Musculoskeletal: Significant spasming in the hands recently and calves with intact lower extremity strength GI: No constipation or  diarrhea Observations/Objective:   Current Outpatient Medications:    [START ON 08/09/2020] oxyCODONE-acetaminophen (PERCOCET) 10-325 MG tablet, Take 1 tablet by mouth every 4 (four) hours as needed for pain., Disp: 150 tablet, Rfl: 0   albuterol (PROVENTIL HFA;VENTOLIN HFA) 108 (90 Base) MCG/ACT inhaler, Inhale 2 puffs into the lungs every 6 (six) hours as needed for wheezing or shortness of breath., Disp: , Rfl:    albuterol (PROVENTIL) (2.5 MG/3ML) 0.083% nebulizer solution, Take 3 mLs (2.5 mg total) by nebulization every 6 (six) hours as needed for wheezing or shortness of breath. (Patient not taking: Reported on 05/29/2020), Disp: 75 mL, Rfl: 0   amoxicillin (AMOXIL) 500 MG tablet, Take 1,000 mg by mouth 2 (two) times daily. (Patient not taking: Reported on 05/29/2020), Disp: , Rfl:    apixaban (ELIQUIS) 5 MG TABS tablet, Take 1 tablet (5 mg total) by mouth 2 (two) times daily., Disp: 60 tablet, Rfl: 0   Ascorbic Acid (VITAMIN C ADULT GUMMIES PO), Take by mouth. 2 gummies daily, Disp: , Rfl:    Ascorbic Acid (VITAMIN C PO), Take 2 tablets by mouth daily., Disp: , Rfl:    azelastine (ASTELIN) 0.1 % nasal spray, Place 2 sprays into both nostrils 2 (two) times daily as needed for allergies. Use in each nostril as directed, Disp: , Rfl:    carvedilol (COREG) 6.25 MG tablet, Take 1 tablet (6.25 mg total) by mouth 2 (two) times daily. (Patient not taking: Reported on 05/29/2020), Disp: 180 tablet, Rfl: 3   cyclobenzaprine (FLEXERIL) 10 MG tablet, Take 10 mg by mouth at bedtime as needed for muscle spasms.,  Disp: , Rfl:    Dextromethorphan-guaiFENesin (CORICIDIN HBP CONGESTION/COUGH) 10-200 MG CAPS, Take 2 tablets by mouth 2 (two) times daily as needed (cough / congestion)., Disp: , Rfl:    dextromethorphan-guaiFENesin (MUCINEX DM) 30-600 MG 12hr tablet, Take 1 tablet by mouth 2 (two) times daily as needed for cough., Disp: , Rfl:    diclofenac sodium (VOLTAREN) 1 % GEL, Apply 2 g topically 4 (four) times  daily as needed (pain). , Disp: , Rfl:    diphenhydrAMINE (BENADRYL) 25 mg capsule, Take 50 mg by mouth every other day., Disp: , Rfl:    ELDERBERRY PO, 2 gummies daily, Disp: , Rfl:    fluconazole (DIFLUCAN) 100 MG tablet, Take 150 mg by mouth. MWF PRN, Disp: , Rfl:    folic acid (FOLVITE) 1 MG tablet, Take 1 mg by mouth at bedtime. , Disp: , Rfl:    gabapentin (NEURONTIN) 300 MG capsule, Take 2 capsules (600 mg total) by mouth See admin instructions. Take 600 mg by mouth twice daily, may take a third 600 mg dose as needed for pain, Disp: 180 capsule, Rfl: 2   ipratropium-albuterol (DUONEB) 0.5-2.5 (3) MG/3ML SOLN, Take 3 mLs by nebulization every 6 (six) hours as needed (shortness of breath)., Disp: , Rfl:    levocetirizine (XYZAL) 5 MG tablet, Take 5 mg by mouth at bedtime. , Disp: , Rfl:    mirabegron ER (MYRBETRIQ) 50 MG TB24 tablet, Take 50 mg by mouth daily. (Patient not taking: Reported on 05/29/2020), Disp: , Rfl:    montelukast (SINGULAIR) 10 MG tablet, Take 10 mg by mouth at bedtime., Disp: , Rfl:    Multiple Vitamins-Minerals (MULTIVITAMIN GUMMIES ADULTS) CHEW, Chew 2 tablets by mouth daily. , Disp: , Rfl:    Nirmatrelvir & Ritonavir 20 x 150 MG & 10 x 100MG  TBPK, TAKE 2 NIRMATRELVIR TABLETS AND TAKE 1 RITONAVIR TABLET BY MOUTH TWICE DAILY FOR 5 DAYS (Patient not taking: No sig reported), Disp: 30 each, Rfl: 0   nortriptyline (PAMELOR) 50 MG capsule, Take 50 mg by mouth at bedtime., Disp: , Rfl:    nystatin (MYCOSTATIN) 100000 UNIT/ML suspension, Take 5 mLs by mouth 4 (four) times daily as needed (yeast). , Disp: , Rfl:    nystatin cream (MYCOSTATIN), Apply 1 application topically 2 (two) times daily as needed (yeast)., Disp: , Rfl:    [START ON 07/10/2020] oxyCODONE-acetaminophen (PERCOCET) 10-325 MG tablet, Take 1 tablet by mouth every 4 (four) hours as needed for pain., Disp: 150 tablet, Rfl: 0   pantoprazole (PROTONIX) 40 MG tablet, Take 40 mg by mouth 2 (two) times daily., Disp: , Rfl:     Polyvinyl Alcohol-Povidone (REFRESH OP), Place 1 drop into both eyes daily as needed (dry eyes)., Disp: , Rfl:    potassium chloride SA (K-DUR,KLOR-CON) 20 MEQ tablet, Take 20 mEq by mouth daily., Disp: , Rfl:    predniSONE (DELTASONE) 10 MG tablet, Take 60 mg by mouth daily with breakfast., Disp: , Rfl:    Probiotic Product (PROBIOTIC COLON SUPPORT) CAPS, Take 1 capsule by mouth at bedtime., Disp: , Rfl:    sacubitril-valsartan (ENTRESTO) 49-51 MG, Take 1 tablet by mouth 2 (two) times daily. (Patient not taking: Reported on 05/29/2020), Disp: , Rfl:    sucralfate (CARAFATE) 1 G tablet, Take 1 g by mouth 2 (two) times daily before a meal., Disp: , Rfl:    torsemide (DEMADEX) 20 MG tablet, Take 2 tablets (40 mg) by mouth once daily, Disp: 60 tablet, Rfl: 3   umeclidinium-vilanterol (  ANORO ELLIPTA) 62.5-25 MCG/INH AEPB, Inhale 1 puff into the lungs daily., Disp: , Rfl:    Vitamin D, Ergocalciferol, (DRISDOL) 50000 UNITS CAPS capsule, Take 50,000 Units by mouth every Thursday. , Disp: , Rfl:    Wheat Dextrin (BENEFIBER DRINK MIX) PACK, Take by mouth. 1 packet daily, Disp: , Rfl:   Assessment and Plan: 1. DDD (degenerative disc disease), lumbar   2. Chronic bilateral low back pain with bilateral sciatica   3. Chronic, continuous use of opioids   4. Chronic pain syndrome   5. Hip pain, bilateral   6. Chronic pain of right hip   7. Spinal stenosis of lumbar region with neurogenic claudication   8. Primary osteoarthritis of left knee   9. Temporal arteritis ([place])   10. Pain in joint of right shoulder   Wendy Sanchez really has had a difficult time recently.  Hopefully the reduction in prednisone will help with alleviating some of the facial swelling and allow improvement in the spasming.  I have recommended an increase to 500 mg of magnesium once a day for 3 days and reduce this to 250 mg/day to see if this can assist.  She is to continue with the Flexeril and we will keep her at her current Percocet  dosing of every 4 hours for 150 tablets/month.  She is due for routine UDS.  No other changes here will be made.  I do not feel she is a candidate for any interventional therapy.  I want her to continue follow-up with her orthopedic surgeon I think it is very reasonable for her to have an injection for her shoulder pain and we will plan on a 20-month return she is to continue follow-up with her primary care physicians for baseline medical care.  Follow Up Instructions:    I discussed the assessment and treatment plan with the patient. The patient was provided an opportunity to ask questions and all were answered. The patient agreed with the plan and demonstrated an understanding of the instructions.   The patient was advised to call back or seek an in-person evaluation if the symptoms worsen or if the condition fails to improve as anticipated.  I provided 30 minutes of non-face-to-face time during this encounter.   Molli Barrows, MD

## 2020-07-03 NOTE — Telephone Encounter (Signed)
Email sent to Dr Andree Elk. Wendy Sanchez was scheduled for a VV on Monday 07/01/20 and states she did receive a phone call.

## 2020-07-06 NOTE — Progress Notes (Signed)
Patient ID: Wendy Sanchez, female    DOB: 1950-11-22, 70 y.o.   MRN: [id number]  HPI  [name] is a 70 y/o female with a history of asthma, HTN, GERD, fibromyalgia, hyperlipidemia, spinal stenosis, COPD, DVT, PE, sleep apnea, temporal arteritis, previous tobacco use and chronic heart failure.   Echo report from 04/26/20 reviewed and showed an EF of 50% with normal PA Pressure of 27 mmHg.  RHC/ LHC done 08/12/16 and showed: There is severe left ventricular systolic dysfunction. LV end diastolic pressure is mildly elevated. The left ventricular ejection fraction is less than 25% by visual estimate. Hemodynamic findings consistent with moderate pulmonary hypertension. LV end diastolic pressure is normal. Normal coronaries Moderately elevated pulmonary pressure   Conclusion Nonischemic dilated cardiomyopathy Severely depressed left ventricular function globally ejection fraction less than 25% No evidence of obstructive coronary disease in the right or left system Moderately elevated pulmonary pressures Wedge pressure of 37 mean A pressures of 51/29 mean of 40  Was in the ED 04/24/20 due to jaw pain and hypotension. CT scan showed TMJ osteoarthrosis. Started on high dose prednisone for temporal arteritis flare with improvement of her symptoms and she was released.    She presents today for a follow-up visit with a chief complaint of moderate fatigue upon minimal exertion. She describes this as chronic in nature having been present for several years. She has associated cough, shortness of breath, hand tremors, leg weakness, right shoulder pain and difficulty sleeping along with this. She denies any abdominal distention, chest pain, dizziness or weight gain.   She says that she's currently undergoing treatment for Hpylori. She's currently taking prednisone and will start tapering down. She has noticed that  she's had more tremors, difficulty sleeping, swelling and weight gain since she's been on  the high dose prednisone.   Currently has carvedilol and entresto on hold due to hypotension.   Past Medical History:  Diagnosis Date   Allergic rhinitis    Arthritis    Asthma    problems with fumes and aerosols cause asthma   Chest pain 10/19/2016   CHF (congestive heart failure) (HCC)    Complication of anesthesia    awakens during surgery; has occurred with last 3-4 surgeries    COPD (chronic obstructive pulmonary disease) ([place])    DVT (deep venous thrombosis) (HCC) 07/13/2018   Right leg   Environmental allergies    fumes    Fibromyalgia    GERD (gastroesophageal reflux disease)    Headache    Hemorrhoids    History of bronchitis    Hx of total knee arthroplasty 12/13/2015   Hyperlipidemia    Hypertension    Morbid obesity with BMI of 45.0-49.9, adult ([place]) 07/25/2015   NICM (nonischemic cardiomyopathy) ([place])    a. ? PVC mediated;  b. 06/2016 Echo: EF 25-30%, mild LVH, mild LAE, mild AI/MR/TR/PR; c. 08/2016 Cath: nl cors, EF 25%.   Numbness    hands bilat when driving; improves when not preforming task    Osteoarthritis of left knee 08/02/2015   PE (pulmonary thromboembolism) ([place]) 07/13/2018   Pes anserinus bursitis of left knee 05/18/2016   Presence of right artificial knee joint 05/18/2016   PVC (premature ventricular contraction) 06/04/2017   PVC's (premature ventricular contractions)    a. 11/2016 [place] started;  b. 11/29/16 48h Holter: 07371 PVC's (41%); c. 12/2016 24h Holter: 18040 PVC's (15%); c. 02/2017 48h Holter: 37262 PVC's (41%).   Sleep apnea    cannot tolerate CPAP  Spinal stenosis of lumbar region 06/19/2015   Status post gastric banding    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   Temporal arteritis ([place]) 05/01/2020   Tinnitus    comes and goes    Unilateral primary osteoarthritis, right knee 12/13/2015   Vertigo    none for over 2 yrs   Past Surgical History:  Procedure Laterality Date   ABDOMINAL HYSTERECTOMY  1979   left ovary remains    ABDOMINAL HYSTERECTOMY     ARTERY BIOPSY Left 06/07/2019   Procedure: BIOPSY TEMPORAL ARTERY;  Surgeon: Algernon Huxley, MD;  Location: [facility] ORS;  Service: Vascular;  Laterality: Left;   CHOLECYSTECTOMY     COLONOSCOPY     COLONOSCOPY N/A 10/30/2019   Procedure: COLONOSCOPY;  Surgeon: Lesly Rubenstein, MD;  Location: [facility] ENDOSCOPY;  Service: Endoscopy;  Laterality: N/A;   ESOPHAGOGASTRODUODENOSCOPY N/A 10/30/2019   Procedure: ESOPHAGOGASTRODUODENOSCOPY (EGD);  Surgeon: Lesly Rubenstein, MD;  Location: [facility] ENDOSCOPY;  Service: Endoscopy;  Laterality: N/A;   fibrous tissue removed from right shoulder and back of neck      2 years ago    [place] N/A 02/28/2019   Procedure: SALIVARY GLAND BIOPSY;  Surgeon: Carloyn Manner, MD;  Location: [facility] ORS;  Service: ENT;  Laterality: N/A;   FRACTURE SURGERY     left small finger   GANGLION CYST EXCISION     GASTRIC BYPASS  1980   states had bypass with banding and band left in -   GASTRIC BYPASS OPEN     JOINT REPLACEMENT Bilateral 2017   knees   KNEE SURGERY Bilateral    both knees  2001   NECK SURGERY N/A 1205/19   ganglion cyst removal, benigh    PULMONARY THROMBECTOMY Bilateral 07/14/2018   Procedure: PULMONARY THROMBECTOMY;  Surgeon: Algernon Huxley, MD;  Location: [place] [facility];  Service: Cardiovascular;  Laterality: Bilateral;   PVC ABLATION N/A 06/04/2017   Procedure: PVC ABLATION;  Surgeon: Evans Lance, MD;  Location: [place] [facility];  Service: Cardiovascular;  Laterality: N/A;   RIGHT/LEFT HEART CATH AND CORONARY ANGIOGRAPHY Bilateral 08/12/2016   Procedure: Right/Left Heart Cath and Coronary Angiography;  Surgeon: Yolonda Kida, MD;  Location: [place] [facility];  Service: Cardiovascular;  Laterality: Bilateral;   TONSILLECTOMY     age 70   TOTAL KNEE ARTHROPLASTY Left 08/02/2015   Procedure: LEFT TOTAL KNEE ARTHROPLASTY;  Surgeon: Mcarthur Rossetti, MD;  Location: [facility];  Service: Orthopedics;   Laterality: Left;   TOTAL KNEE ARTHROPLASTY Right 12/13/2015   Procedure: RIGHT TOTAL KNEE ARTHROPLASTY;  Surgeon: Mcarthur Rossetti, MD;  Location: [facility];  Service: Orthopedics;  Laterality: Right;   Family History  Problem Relation Age of Onset   Hypertension Father    Deep vein thrombosis Father    Dementia Mother    Diabetes Sister    Congestive Heart Failure Sister    Congestive Heart Failure Brother    Congestive Heart Failure Daughter    Congestive Heart Failure Son    Breast cancer Maternal Aunt        great aunt   Social History   Tobacco Use   Smoking status: Former    Packs/day: 0.25    Years: 50.00    Pack years: 12.50    Types: Cigarettes    Quit date: 07/06/2018    Years since quitting: 2.0   Smokeless tobacco: Current    Types: Chew  Substance Use  Topics   Alcohol use: No    Alcohol/week: 0.0 standard drinks   Allergies  Allergen Reactions   Hydrocodone-Acetaminophen Swelling    hands   Iodine Anaphylaxis and Swelling    Iv dye   Other Other (See Comments)    ALLERGY TO METAL - BLACKENS SKIN AND CAUSES A RASH    Tape Swelling and Other (See Comments)    Skin comes off.  Paper tape is ok tegaderm OK   Peanut Oil Other (See Comments)    ** Nuts cause runny nose   Shellfish Allergy Nausea And Vomiting and Swelling    Episode of GI infection after eating clam chowder. Still eats shrimp and other seafood   Prior to Admission medications   Medication Sig Start Date End Date Taking? Authorizing Provider  albuterol (PROVENTIL HFA;VENTOLIN HFA) 108 (90 Base) MCG/ACT inhaler Inhale 2 puffs into the lungs every 6 (six) hours as needed for wheezing or shortness of breath.   Yes [provider]  alendronate (FOSAMAX) 35 MG tablet Take 35 mg by mouth every 7 (seven) days. Take with a full glass of water on an empty stomach.   Yes [provider]  Amoxicill-Rifabutin-Omeprazole (TALICIA) 161-09.6-04 MG CPDR Take 4 tablets by mouth every 8  (eight) hours.   Yes [provider]  apixaban (ELIQUIS) 5 MG TABS tablet Take 1 tablet (5 mg total) by mouth 2 (two) times daily. 07/22/18 07/08/20 Yes Pyreddy, Reatha Harps, MD  Ascorbic Acid (VITAMIN C ADULT GUMMIES PO) Take by mouth. 2 gummies daily   Yes [provider]  azelastine (ASTELIN) 0.1 % nasal spray Place 2 sprays into both nostrils 2 (two) times daily as needed for allergies. Use in each nostril as directed   Yes [provider]  ciprofloxacin (CILOXAN) 0.3 % ophthalmic solution Place 1 drop into both eyes every 4 (four) hours while awake.   Yes [provider]  cyclobenzaprine (FLEXERIL) 10 MG tablet Take 10 mg by mouth at bedtime as needed for muscle spasms.   Yes [provider]  Dextromethorphan-guaiFENesin (CORICIDIN HBP CONGESTION/COUGH) 10-200 MG CAPS Take 2 tablets by mouth 2 (two) times daily as needed (cough / congestion).   Yes [provider]  dextromethorphan-guaiFENesin (MUCINEX DM) 30-600 MG 12hr tablet Take 1 tablet by mouth 2 (two) times daily as needed for cough.   Yes [provider]  diclofenac sodium (VOLTAREN) 1 % GEL Apply 2 g topically 4 (four) times daily as needed (pain).    Yes [provider]  diphenhydrAMINE (BENADRYL) 25 mg capsule Take 50 mg by mouth every other day.   Yes [provider]  doxycycline (PERIOSTAT) 20 MG tablet Take 20 mg by mouth 2 (two) times daily.   Yes [provider]  ELDERBERRY PO 2 gummies daily   Yes [provider]  fluconazole (DIFLUCAN) 100 MG tablet Take 150 mg by mouth. MWF PRN   Yes [provider]  folic acid (FOLVITE) 1 MG tablet Take 1 mg by mouth at bedtime.    Yes [provider]  gabapentin (NEURONTIN) 300 MG capsule Take 2 capsules (600 mg total) by mouth See admin instructions. Take 600 mg by mouth twice daily, may take a third 600 mg dose as needed for pain 07/03/20 08/02/20 Yes Molli Barrows, MD   ipratropium-albuterol (DUONEB) 0.5-2.5 (3) MG/3ML SOLN Take 3 mLs by nebulization every 6 (six) hours as needed (shortness of breath).   Yes [provider]  levocetirizine (XYZAL) 5 MG  tablet Take 5 mg by mouth at bedtime.    Yes [provider]  montelukast (SINGULAIR) 10 MG tablet Take 10 mg by mouth at bedtime.   Yes [provider]  Multiple Vitamins-Minerals (MULTIVITAMIN GUMMIES ADULTS) CHEW Chew 2 tablets by mouth daily.    Yes [provider]  nortriptyline (PAMELOR) 50 MG capsule Take 50 mg by mouth at bedtime.   Yes [provider]  nystatin (MYCOSTATIN) 100000 UNIT/ML suspension Take 5 mLs by mouth 4 (four) times daily as needed (yeast).    Yes [provider]  nystatin cream (MYCOSTATIN) Apply 1 application topically 2 (two) times daily as needed (yeast).   Yes [provider]  oxyCODONE-acetaminophen (PERCOCET) 10-325 MG tablet Take 1 tablet by mouth every 4 (four) hours as needed for pain. 07/10/20 08/09/20 Yes Molli Barrows, MD  pantoprazole (PROTONIX) 40 MG tablet Take 40 mg by mouth 2 (two) times daily.   Yes [provider]  Polyvinyl Alcohol-Povidone (REFRESH OP) Place 1 drop into both eyes daily as needed (dry eyes).   Yes [provider]  potassium chloride SA (K-DUR,KLOR-CON) 20 MEQ tablet Take 20 mEq by mouth daily.   Yes [provider]  predniSONE (DELTASONE) 10 MG tablet Take 60 mg by mouth daily with breakfast.   Yes [provider]  Probiotic Product (PROBIOTIC COLON SUPPORT) CAPS Take 1 capsule by mouth at bedtime.   Yes [provider]  sucralfate (CARAFATE) 1 G tablet Take 1 g by mouth 2 (two) times daily before a meal.   Yes [provider]  torsemide (DEMADEX) 20 MG tablet Take 2 tablets (40 mg) by mouth once daily 04/12/20  Yes Deboraha Sprang, MD  umeclidinium-vilanterol (ANORO ELLIPTA) 62.5-25 MCG/INH AEPB Inhale 1 puff into the lungs daily. 12/23/17   Yes [provider]  Vitamin D, Ergocalciferol, (DRISDOL) 50000 UNITS CAPS capsule Take 50,000 Units by mouth every Thursday.    Yes [provider]  Wheat Dextrin (BENEFIBER DRINK MIX) PACK Take by mouth. 1 packet daily   Yes [provider]  albuterol (PROVENTIL) (2.5 MG/3ML) 0.083% nebulizer solution Take 3 mLs (2.5 mg total) by nebulization every 6 (six) hours as needed for wheezing or shortness of breath. Patient not taking: Reported on 05/29/2020 01/19/20   Rodriguez-Southworth, Sunday Spillers, PA-C  carvedilol (COREG) 6.25 MG tablet Take 1 tablet (6.25 mg total) by mouth 2 (two) times daily. Patient not taking: Reported on 05/29/2020 07/28/18   Deboraha Sprang, MD  mirabegron ER (MYRBETRIQ) 50 MG TB24 tablet Take 50 mg by mouth daily. Patient not taking: Reported on 05/29/2020    [provider]  Nirmatrelvir & Ritonavir 20 x 150 MG & 10 x 100MG  TBPK TAKE 2 NIRMATRELVIR TABLETS AND TAKE 1 RITONAVIR TABLET BY MOUTH TWICE DAILY FOR 5 DAYS Patient not taking: No sig reported 01/21/20 01/20/21  Jacquelin Hawking, NP  oxyCODONE-acetaminophen (PERCOCET) 10-325 MG tablet Take 1 tablet by mouth every 4 (four) hours as needed for pain. 08/09/20 09/08/20  Molli Barrows, MD  sacubitril-valsartan (ENTRESTO) 49-51 MG Take 1 tablet by mouth 2 (two) times daily. Patient not taking: Reported on 05/29/2020    [provider]    Review of Systems  Constitutional:  Positive for fatigue (tire easily). Negative for appetite change.  HENT:  Positive for congestion. Negative for rhinorrhea and sore throat.   Eyes: Negative.   Respiratory:  Positive for cough (dry) and shortness of breath. Negative for chest tightness.  Cardiovascular:  Positive for leg swelling (at times). Negative for chest pain and palpitations.  Gastrointestinal:  Positive for abdominal pain and constipation. Negative for abdominal distention.  Endocrine: Negative.   Genitourinary: Negative.   Musculoskeletal:   Positive for arthralgias (right shoulder) and neck pain.  Skin: Negative.   Allergic/Immunologic: Negative.   Neurological:  Positive for tremors (in hands) and weakness (leg weakness). Negative for dizziness and light-headedness.  Hematological:  Negative for adenopathy. Does not bruise/bleed easily.  Psychiatric/Behavioral:  Positive for sleep disturbance (sleeping on 2 pillow with HOB). Negative for dysphoric mood. The patient is not nervous/anxious.    Vitals:   07/08/20 1211  BP: 114/75  Pulse: 95  Resp: 20  SpO2: 99%  Weight: 294 lb 4 oz (133.5 kg)  Height: 5\' 7"  (1.702 m)   Wt Readings from Last 3 Encounters:  07/08/20 294 lb 4 oz (133.5 kg)  05/29/20 297 lb 4 oz (134.8 kg)  04/18/20 297 lb (134.7 kg)   Lab Results  Component Value Date   CREATININE 1.00 04/18/2020   CREATININE 0.74 04/11/2020   CREATININE 0.85 06/07/2019   Physical Exam Vitals and nursing note reviewed.  Constitutional:      Appearance: Normal appearance.  HENT:     Head: Normocephalic and atraumatic.  Cardiovascular:     Rate and Rhythm: Normal rate and regular rhythm.  Pulmonary:     Effort: Pulmonary effort is normal. No respiratory distress.     Breath sounds: No wheezing or rales.  Abdominal:     General: There is no distension.     Palpations: Abdomen is soft.     Tenderness: There is no abdominal tenderness.  Musculoskeletal:        General: No tenderness.     Cervical back: Normal range of motion and neck supple.     Right lower leg: Edema (2+ pitting) present.     Left lower leg: Edema (2+ pitting) present.  Skin:    General: Skin is warm and dry.  Neurological:     General: No focal deficit present.     Mental Status: She is alert and oriented to person, place, and time.  Psychiatric:        Mood and Affect: Mood normal.        Behavior: Behavior normal.        Thought Content: Thought content normal.   Assessment & Plan:  1: Chronic heart failure with preserved ejection  fraction without structural changes- - NYHA class III - euvolemic today - weighing daily; home weight chart reviewed and showed weight 288-292 pounds; reminded to call for an overnight weight gain of > 2 pounds or a weekly weight gain of > 5 pounds - weight down 3 pounds from last visit here 6 weeks ago - saw cardiology Clayborn Bigness) 05/14/20; returns August - not adding salt and is using Mrs Deliah Boston for seasoning; reading food labels for sodium content - BNP 07/13/2018 was 564.0  2: HTN- - BP looks good today; entresto and carvedilol currently on hold - home BP log reviewed and ranges from 99-143/ 66-90 - saw previous PCP Kary Kos) 09/04/19; now seeing Dr. Ronnald Ramp @ [facility] and returns July - [facility] 04/18/20 reviewed and showed sodium 136, potassium 4.7, creatinine 1.00 and GFR >60  3: COPD- - saw pulmonology Raul Del) 10/23/19  4: Sleep apnea- - supposed to be wearing CPAP but she says that it makes her so congested that she can't wear it - she says that she may  try wearing it again  5: Lymphedema- - stage 2 - limited in her ability to exercise due to weakness - wearing compression socks with improvement of edema when she wears them - has noticed increased edema since being on high dose prednisone   Medication bottles reviewed.   Return in 2 months or sooner for any questions/problems before then.

## 2020-07-08 ENCOUNTER — Other Ambulatory Visit: Payer: Self-pay

## 2020-07-08 ENCOUNTER — Encounter: Payer: Self-pay | Admitting: Family

## 2020-07-08 ENCOUNTER — Ambulatory Visit: Payer: Medicare Other | Attending: Family | Admitting: Family

## 2020-07-08 VITALS — BP 114/75 | HR 95 | Resp 20 | Ht 67.0 in | Wt 294.2 lb

## 2020-07-08 DIAGNOSIS — G4733 Obstructive sleep apnea (adult) (pediatric): Secondary | ICD-10-CM

## 2020-07-08 DIAGNOSIS — M26609 Unspecified temporomandibular joint disorder, unspecified side: Secondary | ICD-10-CM | POA: Diagnosis not present

## 2020-07-08 DIAGNOSIS — Z9884 Bariatric surgery status: Secondary | ICD-10-CM | POA: Insufficient documentation

## 2020-07-08 DIAGNOSIS — I509 Heart failure, unspecified: Secondary | ICD-10-CM | POA: Diagnosis present

## 2020-07-08 DIAGNOSIS — Z87891 Personal history of nicotine dependence: Secondary | ICD-10-CM | POA: Insufficient documentation

## 2020-07-08 DIAGNOSIS — E785 Hyperlipidemia, unspecified: Secondary | ICD-10-CM | POA: Insufficient documentation

## 2020-07-08 DIAGNOSIS — J449 Chronic obstructive pulmonary disease, unspecified: Secondary | ICD-10-CM | POA: Diagnosis not present

## 2020-07-08 DIAGNOSIS — I10 Essential (primary) hypertension: Secondary | ICD-10-CM

## 2020-07-08 DIAGNOSIS — Z79899 Other long term (current) drug therapy: Secondary | ICD-10-CM | POA: Diagnosis not present

## 2020-07-08 DIAGNOSIS — G4730 Sleep apnea, unspecified: Secondary | ICD-10-CM | POA: Diagnosis not present

## 2020-07-08 DIAGNOSIS — I5032 Chronic diastolic (congestive) heart failure: Secondary | ICD-10-CM

## 2020-07-08 DIAGNOSIS — Z885 Allergy status to narcotic agent status: Secondary | ICD-10-CM | POA: Diagnosis not present

## 2020-07-08 DIAGNOSIS — Z888 Allergy status to other drugs, medicaments and biological substances status: Secondary | ICD-10-CM | POA: Diagnosis not present

## 2020-07-08 DIAGNOSIS — M316 Other giant cell arteritis: Secondary | ICD-10-CM | POA: Insufficient documentation

## 2020-07-08 DIAGNOSIS — Z7952 Long term (current) use of systemic steroids: Secondary | ICD-10-CM | POA: Diagnosis not present

## 2020-07-08 DIAGNOSIS — Z86718 Personal history of other venous thrombosis and embolism: Secondary | ICD-10-CM | POA: Insufficient documentation

## 2020-07-08 DIAGNOSIS — I110 Hypertensive heart disease with heart failure: Secondary | ICD-10-CM | POA: Diagnosis not present

## 2020-07-08 DIAGNOSIS — I420 Dilated cardiomyopathy: Secondary | ICD-10-CM | POA: Insufficient documentation

## 2020-07-08 DIAGNOSIS — Z7901 Long term (current) use of anticoagulants: Secondary | ICD-10-CM | POA: Diagnosis not present

## 2020-07-08 DIAGNOSIS — Z6841 Body Mass Index (BMI) 40.0 and over, adult: Secondary | ICD-10-CM | POA: Diagnosis not present

## 2020-07-08 DIAGNOSIS — I890 Lymphedema, not elsewhere classified: Secondary | ICD-10-CM | POA: Diagnosis not present

## 2020-07-08 DIAGNOSIS — Z8249 Family history of ischemic heart disease and other diseases of the circulatory system: Secondary | ICD-10-CM | POA: Diagnosis not present

## 2020-07-08 DIAGNOSIS — M797 Fibromyalgia: Secondary | ICD-10-CM | POA: Insufficient documentation

## 2020-07-08 NOTE — Patient Instructions (Signed)
Continue weighing daily and call for an overnight weight gain of > 2 pounds or a weekly weight gain of >5 pounds. 

## 2020-07-16 ENCOUNTER — Other Ambulatory Visit: Payer: Self-pay | Admitting: Anesthesiology

## 2020-07-18 ENCOUNTER — Other Ambulatory Visit: Payer: Self-pay

## 2020-07-20 LAB — TOXASSURE SELECT 13 (MW), URINE

## 2020-09-05 ENCOUNTER — Encounter: Payer: Self-pay | Admitting: Family

## 2020-09-05 ENCOUNTER — Other Ambulatory Visit: Payer: Self-pay

## 2020-09-05 ENCOUNTER — Ambulatory Visit: Payer: Medicare Other | Attending: Family | Admitting: Family

## 2020-09-05 VITALS — BP 96/58 | HR 111 | Resp 18 | Ht 67.0 in | Wt 301.0 lb

## 2020-09-05 DIAGNOSIS — Z86718 Personal history of other venous thrombosis and embolism: Secondary | ICD-10-CM | POA: Diagnosis not present

## 2020-09-05 DIAGNOSIS — M316 Other giant cell arteritis: Secondary | ICD-10-CM | POA: Insufficient documentation

## 2020-09-05 DIAGNOSIS — Z09 Encounter for follow-up examination after completed treatment for conditions other than malignant neoplasm: Secondary | ICD-10-CM | POA: Insufficient documentation

## 2020-09-05 DIAGNOSIS — K219 Gastro-esophageal reflux disease without esophagitis: Secondary | ICD-10-CM | POA: Insufficient documentation

## 2020-09-05 DIAGNOSIS — M797 Fibromyalgia: Secondary | ICD-10-CM | POA: Insufficient documentation

## 2020-09-05 DIAGNOSIS — E785 Hyperlipidemia, unspecified: Secondary | ICD-10-CM | POA: Diagnosis not present

## 2020-09-05 DIAGNOSIS — I110 Hypertensive heart disease with heart failure: Secondary | ICD-10-CM | POA: Diagnosis not present

## 2020-09-05 DIAGNOSIS — Z8249 Family history of ischemic heart disease and other diseases of the circulatory system: Secondary | ICD-10-CM | POA: Diagnosis not present

## 2020-09-05 DIAGNOSIS — G4733 Obstructive sleep apnea (adult) (pediatric): Secondary | ICD-10-CM

## 2020-09-05 DIAGNOSIS — I10 Essential (primary) hypertension: Secondary | ICD-10-CM

## 2020-09-05 DIAGNOSIS — Z7952 Long term (current) use of systemic steroids: Secondary | ICD-10-CM | POA: Insufficient documentation

## 2020-09-05 DIAGNOSIS — Z87891 Personal history of nicotine dependence: Secondary | ICD-10-CM | POA: Insufficient documentation

## 2020-09-05 DIAGNOSIS — I890 Lymphedema, not elsewhere classified: Secondary | ICD-10-CM

## 2020-09-05 DIAGNOSIS — Z79899 Other long term (current) drug therapy: Secondary | ICD-10-CM | POA: Diagnosis not present

## 2020-09-05 DIAGNOSIS — Z7901 Long term (current) use of anticoagulants: Secondary | ICD-10-CM | POA: Insufficient documentation

## 2020-09-05 DIAGNOSIS — G4730 Sleep apnea, unspecified: Secondary | ICD-10-CM | POA: Diagnosis not present

## 2020-09-05 DIAGNOSIS — I420 Dilated cardiomyopathy: Secondary | ICD-10-CM | POA: Diagnosis not present

## 2020-09-05 DIAGNOSIS — I5032 Chronic diastolic (congestive) heart failure: Secondary | ICD-10-CM | POA: Diagnosis present

## 2020-09-05 DIAGNOSIS — J449 Chronic obstructive pulmonary disease, unspecified: Secondary | ICD-10-CM | POA: Diagnosis not present

## 2020-09-05 MED ORDER — METOLAZONE 2.5 MG PO TABS: 2.5000 mg | ORAL_TABLET | Freq: Every day | ORAL | 0 refills | Status: DC

## 2020-09-05 NOTE — Progress Notes (Signed)
Patient ID: SCHELBY LAFLASH, female    DOB: 1950-01-20, 70 y.o.   MRN: [id number]  HPI  Wendy Sanchez is a 70 y/o female with a history of asthma, HTN, GERD, fibromyalgia, hyperlipidemia, spinal stenosis, COPD, DVT, PE, sleep apnea, temporal arteritis, previous tobacco use Wendy chronic heart failure.   Echo report from 04/26/20 reviewed Wendy showed an EF of 50% with normal PA Pressure of 27 mmHg.  RHC/ LHC done 08/12/16 Wendy showed: There is severe left ventricular systolic dysfunction. LV end diastolic pressure is mildly elevated. The left ventricular ejection fraction is less than 25% by visual estimate. Hemodynamic findings consistent with moderate pulmonary hypertension. LV end diastolic pressure is normal. Normal coronaries Moderately elevated pulmonary pressure  Conclusion Nonischemic dilated cardiomyopathy Severely depressed left ventricular function globally ejection fraction less than 25% No evidence of obstructive coronary disease in the right or left system Moderately elevated pulmonary pressures Wedge pressure of 37 mean A pressures of 51/29 mean of 40  Was in the ED 07/22/20 due to epigastric pain where Wendy was treated Wendy released. Was in the ED 07/18/20 due to weight gain Wendy headache where Wendy was treated Wendy released. Was in the ED 04/24/20 due to jaw pain Wendy hypotension. CT scan showed TMJ osteoarthrosis. Started on high dose prednisone for temporal arteritis flare with improvement of her symptoms Wendy Wendy was released.    Wendy presents today for a follow-up visit with a chief complaint of moderate fatigue with little exertion. Wendy describes this as chronic in nature having been present for several years. Wendy has associated shortness of breath, pedal edema (worsening), leg weakness Wendy fluctuating weight along with this. Wendy denies any abdominal distention, palpitations, chest pain, dizziness or cough.   Wendy is currently on a lengthy prednisone taper as her providers feel the long-term  prednisone may be contributing to all the edema over her body. Wendy is going to be starting actemra weekly injection within the next couple of weeks that will hopefully replace the prednisone without as much side effects.   May possibly need her right rotator cuff repaired again.   Has been having some swallowing problems where Wendy feels like the food is stuck. Has a bariatric consult to have her gastric band evaluated.   Stopped smoking 08/06/20.  Past Medical History:  Diagnosis Date   Allergic rhinitis    Arthritis    Asthma    problems with fumes Wendy aerosols cause asthma   Chest pain 10/19/2016   CHF (congestive heart failure) (HCC)    Complication of anesthesia    awakens during surgery; has occurred with last 3-4 surgeries    COPD (chronic obstructive pulmonary disease) ([place])    DVT (deep venous thrombosis) (HCC) 07/13/2018   Right leg   Environmental allergies    fumes    Fibromyalgia    GERD (gastroesophageal reflux disease)    Headache    Hemorrhoids    History of bronchitis    Hx of total knee arthroplasty 12/13/2015   Hyperlipidemia    Hypertension    Morbid obesity with BMI of 45.0-49.9, adult ([place]) 07/25/2015   NICM (nonischemic cardiomyopathy) ([place])    a. ? PVC mediated;  b. 06/2016 Echo: EF 25-30%, mild LVH, mild LAE, mild AI/MR/TR/PR; c. 08/2016 Cath: nl cors, EF 25%.   Numbness    hands bilat when driving; improves when not preforming task    Osteoarthritis of left knee 08/02/2015   PE (pulmonary thromboembolism) ([place]) 07/13/2018   Pes  anserinus bursitis of left knee 05/18/2016   Presence of right artificial knee joint 05/18/2016   PVC (premature ventricular contraction) 06/04/2017   PVC's (premature ventricular contractions)    a. 11/2016 [place] started;  b. 11/29/16 48h Holter: [id number] PVC's (41%); c. 12/2016 24h Holter: 18040 PVC's (15%); c. 02/2017 48h Holter: 37262 PVC's (41%).   Sleep apnea    cannot tolerate CPAP   Spinal stenosis of lumbar region 06/19/2015    Status post gastric banding    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   Temporal arteritis ([place]) 05/01/2020   Tinnitus    comes Wendy goes    Unilateral primary osteoarthritis, right knee 12/13/2015   Vertigo    none for over 2 yrs   Past Surgical History:  Procedure Laterality Date   ABDOMINAL HYSTERECTOMY  1979   left ovary remains   ABDOMINAL HYSTERECTOMY     ARTERY BIOPSY Left 06/07/2019   Procedure: BIOPSY TEMPORAL ARTERY;  Surgeon: Algernon Huxley, MD;  Location: [facility] OR[facility];  Service: Vascular;  Laterality: Left;   CHOLECYSTECTOMY     COLONOSCOPY     COLONOSCOPY N/A 10/30/2019   Procedure: COLONOSCOPY;  Surgeon: Lesly Rubenstein, MD;  Location: [facility] ENDOS[facility];  Service: Endoscopy;  Laterality: N/A;   ESOPHAGOGASTRODUODENOSCOPY N/A 10/30/2019   Procedure: ESOPHAGOGASTRODUODENOSCOPY (EGD);  Surgeon: Lesly Rubenstein, MD;  Location: [facility] ENDOSCOPY;  Service: Endoscopy;  Laterality: N/A;   fibrous tissue removed from right shoulder Wendy back of neck      2 years ago    [place] N/A 02/28/2019   Procedure: SALIVARY GLAND BIOPSY;  Surgeon: Carloyn Manner, MD;  Location: [facility] ORS;  Service: ENT;  Laterality: N/A;   FRACTURE SURGERY     left small finger   GANGLION CYST EXCISION     GASTRIC BYPASS  1980   states had bypass with banding Wendy band left in -   GASTRIC BYPASS OPEN     JOINT REPLACEMENT Bilateral 2017   knees   KNEE SURGERY Bilateral    both knees  2001   NECK SURGERY N/A 1205/19   ganglion cyst removal, benigh    PULMONARY THROMBECTOMY Bilateral 07/14/2018   Procedure: PULMONARY THROMBECTOMY;  Surgeon: Algernon Huxley, MD;  Location: [place] [facility];  Service: Cardiovascular;  Laterality: Bilateral;   PVC ABLATION N/A 06/04/2017   Procedure: PVC ABLATION;  Surgeon: Evans Lance, MD;  Location: [place] [facility];  Service: Cardiovascular;  Laterality: N/A;   RIGHT/LEFT HEART CATH Wendy CORONARY ANGIOGRAPHY Bilateral 08/12/2016   Procedure:  Right/Left Heart Cath Wendy Coronary Angiography;  Surgeon: Yolonda Kida, MD;  Location: [place] [facility];  Service: Cardiovascular;  Laterality: Bilateral;   TONSILLECTOMY     age 62   TOTAL KNEE ARTHROPLASTY Left 08/02/2015   Procedure: LEFT TOTAL KNEE ARTHROPLASTY;  Surgeon: Mcarthur Rossetti, MD;  Location: [facility];  Service: Orthopedics;  Laterality: Left;   TOTAL KNEE ARTHROPLASTY Right 12/13/2015   Procedure: RIGHT TOTAL KNEE ARTHROPLASTY;  Surgeon: Mcarthur Rossetti, MD;  Location: [facility];  Service: Orthopedics;  Laterality: Right;   Family History  Problem Relation Age of Onset   Hypertension Father    Deep vein thrombosis Father    Dementia Mother    Diabetes Sister    Congestive Heart Failure Sister    Congestive Heart Failure Brother    Congestive Heart Failure Daughter    Congestive Heart Failure Son    Breast cancer Maternal Aunt  great aunt   Social History   Tobacco Use   Smoking status: Former    Packs/day: 0.25    Years: 50.00    Pack years: 12.50    Types: Cigarettes    Quit date: 07/06/2018    Years since quitting: 2.1   Smokeless tobacco: Current    Types: Chew  Substance Use Topics   Alcohol use: No    Alcohol/week: 0.0 standard drinks   Allergies  Allergen Reactions   Hydrocodone-Acetaminophen Swelling    hands   Iodine Anaphylaxis Wendy Swelling    Iv dye   Other Other (See Comments)    ALLERGY TO METAL - BLACKENS SKIN Wendy CAUSES A RASH    Tape Swelling Wendy Other (See Comments)    Skin comes off.  Paper tape is ok tegaderm OK   Peanut Oil Other (See Comments)    ** Nuts cause runny nose   Shellfish Allergy Nausea Wendy Vomiting Wendy Swelling    Episode of GI infection after eating clam chowder. Still eats shrimp Wendy other seafood   Prior to Admission medications   Medication Sig Start Date End Date Taking? Authorizing Provider  albuterol (PROVENTIL HFA;VENTOLIN HFA) 108 (90 Base) MCG/ACT inhaler Inhale 2 puffs into the lungs  every 6 (six) hours as needed for wheezing or shortness of breath.    [provider]  albuterol (PROVENTIL) (2.5 MG/3ML) 0.083% nebulizer solution Take 3 mLs (2.5 mg total) by nebulization every 6 (six) hours as needed for wheezing or shortness of breath. Patient not taking: Reported on 05/29/2020 01/19/20   Rodriguez-Southworth, Sunday Spillers, PA-C  alendronate (FOSAMAX) 35 MG tablet Take 35 mg by mouth every 7 (seven) days. Take with a full glass of water on an empty stomach.    [provider]  Amoxicill-Rifabutin-Omeprazole (TALICIA) 99991111 MG CPDR Take 4 tablets by mouth every 8 (eight) hours.    [provider]  apixaban (ELIQUIS) 5 MG TABS tablet Take 1 tablet (5 mg total) by mouth 2 (two) times daily. 07/22/18 07/08/20  Saundra Shelling, MD  Ascorbic Acid (VITAMIN C ADULT GUMMIES PO) Take by mouth. 2 gummies daily    [provider]  azelastine (ASTELIN) 0.1 % nasal spray Wendy Sanchez 2 sprays into both nostrils 2 (two) times daily as needed for allergies. Use in each nostril as directed    [provider]  carvedilol (COREG) 6.25 MG tablet Take 1 tablet (6.25 mg total) by mouth 2 (two) times daily. Patient not taking: Reported on 05/29/2020 07/28/18   Deboraha Sprang, MD  ciprofloxacin (CILOXAN) 0.3 % ophthalmic solution Wendy Sanchez 1 drop into both eyes every 4 (four) hours while awake.    [provider]  cyclobenzaprine (FLEXERIL) 10 MG tablet Take 10 mg by mouth at bedtime as needed for muscle spasms.    [provider]  Dextromethorphan-guaiFENesin (CORICIDIN HBP CONGESTION/COUGH) 10-200 MG CAPS Take 2 tablets by mouth 2 (two) times daily as needed (cough / congestion).    [provider]  dextromethorphan-guaiFENesin (MUCINEX DM) 30-600 MG 12hr tablet Take 1 tablet by mouth 2 (two) times daily as needed for cough.    [provider]  diclofenac sodium (VOLTAREN) 1 % GEL Apply 2 g topically 4 (four) times daily as needed (pain).      [provider]  diphenhydrAMINE (BENADRYL) 25 mg capsule Take 50 mg by mouth every other day.    [provider]  doxycycline (PERIOSTAT) 20 MG tablet Take 20 mg by mouth 2 (two) times  daily.    [provider]  ELDERBERRY PO 2 gummies daily    [provider]  fluconazole (DIFLUCAN) 100 MG tablet Take 150 mg by mouth. MWF PRN    [provider]  folic acid (FOLVITE) 1 MG tablet Take 1 mg by mouth at bedtime.     [provider]  gabapentin (NEURONTIN) 300 MG capsule Take 2 capsules (600 mg total) by mouth See admin instructions. Take 600 mg by mouth twice daily, may take a third 600 mg dose as needed for pain 07/03/20 08/02/20  Molli Barrows, MD  ipratropium-albuterol (DUONEB) 0.5-2.5 (3) MG/3ML SOLN Take 3 mLs by nebulization every 6 (six) hours as needed (shortness of breath).    [provider]  levocetirizine (XYZAL) 5 MG tablet Take 5 mg by mouth at bedtime.     [provider]  mirabegron ER (MYRBETRIQ) 50 MG TB24 tablet Take 50 mg by mouth daily. Patient not taking: Reported on 05/29/2020    [provider]  montelukast (SINGULAIR) 10 MG tablet Take 10 mg by mouth at bedtime.    [provider]  Multiple Vitamins-Minerals (MULTIVITAMIN GUMMIES ADULTS) CHEW Chew 2 tablets by mouth daily.     [provider]  Nirmatrelvir & Ritonavir 20 x 150 MG & 10 x '100MG'$  TBPK TAKE 2 NIRMATRELVIR TABLETS Wendy TAKE 1 RITONAVIR TABLET BY MOUTH TWICE DAILY FOR 5 DAYS Patient not taking: No sig reported 01/21/20 01/20/21  Jacquelin Hawking, NP  nortriptyline (PAMELOR) 50 MG capsule Take 50 mg by mouth at bedtime.    [provider]  nystatin (MYCOSTATIN) 100000 UNIT/ML suspension Take 5 mLs by mouth 4 (four) times daily as needed (yeast).     [provider]  nystatin cream (MYCOSTATIN) Apply 1 application topically 2 (two) times daily as needed (yeast).    [provider]   oxyCODONE-acetaminophen (PERCOCET) 10-325 MG tablet Take 1 tablet by mouth every 4 (four) hours as needed for pain. 08/09/20 09/08/20  Molli Barrows, MD  pantoprazole (PROTONIX) 40 MG tablet Take 40 mg by mouth 2 (two) times daily.    [provider]  Polyvinyl Alcohol-Povidone (REFRESH OP) Wendy Sanchez 1 drop into both eyes daily as needed (dry eyes).    [provider]  potassium chloride SA (K-DUR,KLOR-CON) 20 MEQ tablet Take 20 mEq by mouth daily.    [provider]  predniSONE (DELTASONE) 10 MG tablet Take 40 mg by mouth daily with breakfast.    [provider]  Probiotic Product (PROBIOTIC COLON SUPPORT) CAPS Take 1 capsule by mouth at bedtime.    [provider]  sacubitril-valsartan (ENTRESTO) 24-26 MG Take 1 tablet by mouth 2 (two) times daily.     [provider]  sucralfate (CARAFATE) 1 G tablet Take 1 g by mouth 2 (two) times daily before a meal.    [provider]  torsemide (DEMADEX) 20 MG tablet Take 2 tablets (40 mg) by mouth once daily 04/12/20   Deboraha Sprang, MD  umeclidinium-vilanterol (ANORO ELLIPTA) 62.5-25 MCG/INH AEPB Inhale 1 puff into the lungs daily. 12/23/17   [provider]  Vitamin D, Ergocalciferol, (DRISDOL) 50000 UNITS CAPS capsule Take 50,000 Units by mouth every Thursday.     [provider]  Wheat Dextrin (BENEFIBER DRINK MIX) PACK Take by mouth. 1 packet daily    [provider]    Review of Systems  Constitutional:  Positive for fatigue (tire easily). Negative for appetite change.  HENT:  Positive for congestion Wendy trouble swallowing (at times). Negative for rhinorrhea Wendy sore throat.   Eyes: Negative.   Respiratory:  Positive for shortness of breath. Negative for cough Wendy chest tightness.   Cardiovascular:  Positive for leg swelling (worsening over last several weeks). Negative for chest pain Wendy palpitations.  Gastrointestinal:  Positive for abdominal pain Wendy  constipation. Negative for abdominal distention.  Endocrine: Negative.   Genitourinary: Negative.   Musculoskeletal:  Positive for arthralgias (right shoulder) Wendy neck pain.  Skin: Negative.   Allergic/Immunologic: Negative.   Neurological:  Positive for tremors (in hands) Wendy weakness (leg weakness). Negative for dizziness Wendy light-headedness.  Hematological:  Negative for adenopathy. Does not bruise/bleed easily.  Psychiatric/Behavioral:  Positive for sleep disturbance (sleeping on 2 pillow with HOB). Negative for dysphoric mood. The patient is not nervous/anxious.    Vitals:   09/05/20 1035  BP: (!) 96/58  Pulse: (!) 111  Resp: 18  SpO2: 93%  Weight: (!) 301 lb (136.5 kg)  Height: '5\' 7"'$  (1.702 m)   Wt Readings from Last 3 Encounters:  09/05/20 (!) 301 lb (136.5 kg)  07/08/20 294 lb 4 oz (133.5 kg)  05/29/20 297 lb 4 oz (134.8 kg)   Lab Results  Component Value Date   CREATININE 1.00 04/18/2020   CREATININE 0.74 04/11/2020   CREATININE 0.85 06/07/2019     Physical Exam Vitals Wendy nursing note reviewed. Exam conducted with a chaperone present (sister).  Constitutional:      Appearance: Normal appearance.  HENT:     Head: Normocephalic Wendy atraumatic.  Cardiovascular:     Rate Wendy Rhythm: Regular rhythm. Tachycardia present.  Pulmonary:     Effort: Pulmonary effort is normal. No respiratory distress.     Breath sounds: No wheezing or rales.  Abdominal:     General: There is no distension.     Palpations: Abdomen is soft.     Tenderness: There is no abdominal tenderness.  Musculoskeletal:        General: No tenderness.     Cervical back: Normal range of motion Wendy neck supple.     Right lower leg: Edema (2+ pitting) present.     Left lower leg: Edema (2+ pitting) present.     Comments: Edema R > L  Skin:    General: Skin is warm Wendy dry.  Neurological:     General: No focal deficit present.     Mental Status: Wendy Sanchez, Wendy Sanchez, Wendy  time.  Psychiatric:        Mood Wendy Affect: Mood normal.        Behavior: Behavior normal.        Thought Content: Thought content normal.   Assessment & Plan:  1: Chronic heart failure with preserved ejection fraction without structural changes- - NYHA class III - fluid overloaded today with worsening edema, fluctuating weight Wendy shortness of breath - weighing daily; home weight chart reviewed Wendy showed weight 295-300 pounds over the last 2 weeks; peak weight of 306 pounds 20 days ago; reminded to call for an overnight weight gain of > 2 pounds or a weekly weight gain of > 5 pounds - weight up 7 pounds from last visit here 2 months ago - saw cardiology [facility]) 08/12/20 - saw [facility] cardiology ([place]) 08/19/20 & had entresto resumed; carvedilol on hold; torsemide increased for a few days; returns to her next week & lab work can be checked at that time post metolazone use -  on GDMT of entresto - discussed IV lasix vs oral metolazone with her BP; opted to use metolazone 2.'5mg'$  daily for 3 days with additional 44mq potassium on the days Wendy takes it; advised to take it 1/2 hour before morning torsemide Wendy to hold if SBP is <95 - if 1 dose works great, Wendy doesn't have to take all 3 doses - not adding salt Wendy is using Mrs DDeliah Bostonfor seasoning; reading food labels for sodium content - BNP 08/19/20 was 23.16  2: HTN- - BP on the low side in the office but without dizziness (96/58) - home BP log reviewed Wendy shows 107-120/69-82 - saw previous PCP (Kary Kos 09/04/19; [facility] BMP 08/19/20 reviewed Wendy showed sodium 144, potassium 4.2, creatinine 1.51 Wendy GFR 37  3: COPD- - saw pulmonology (Raul Del 10/23/19 - stopped smoking 08/06/20 Wendy Wendy was congratulated on that  4: Sleep apnea- - supposed to be wearing CPAP but Wendy says that it makes her so congested that Wendy can't wear it - Wendy says that Wendy may try wearing it again  5: Lymphedema- - stage 2 - limited in her  ability to exercise due to weakness - wearing compression socks daily - elevating legs but edema persists   Medication bottles reviewed.   Due to multiple providers Wendy her wanting [facility], will not make a return appointment at this time. [facility] but that Wendy could call back at anytime for any questions or to make another appointment Wendy Wendy was comfortable with this plan.

## 2020-09-05 NOTE — Telephone Encounter (Signed)
error 

## 2020-09-05 NOTE — Patient Instructions (Addendum)
Continue weighing daily and call for an overnight weight gain of > 2 pounds or a weekly weight gain of >5 pounds.    Take metolazone (booster fluid pill) 1/2 hour before morning torsemide. On the days you take the metolazone, take an extra potassium pill with it.    When you check your blood pressure, if the top number is less than 95, do not take the metolazone.    Call us in the future for any questions/ problems or if you'd like to make another appointment.

## 2020-09-11 ENCOUNTER — Encounter: Payer: Medicare Other | Admitting: Anesthesiology

## 2020-09-11 ENCOUNTER — Ambulatory Visit: Payer: Medicare Other | Attending: Anesthesiology | Admitting: Anesthesiology

## 2020-09-11 ENCOUNTER — Other Ambulatory Visit: Payer: Self-pay

## 2020-09-11 ENCOUNTER — Encounter: Payer: Self-pay | Admitting: Anesthesiology

## 2020-09-11 DIAGNOSIS — G8929 Other chronic pain: Secondary | ICD-10-CM

## 2020-09-11 DIAGNOSIS — M19011 Primary osteoarthritis, right shoulder: Secondary | ICD-10-CM

## 2020-09-11 DIAGNOSIS — M48062 Spinal stenosis, lumbar region with neurogenic claudication: Secondary | ICD-10-CM

## 2020-09-11 DIAGNOSIS — M5136 Other intervertebral disc degeneration, lumbar region: Secondary | ICD-10-CM | POA: Diagnosis not present

## 2020-09-11 DIAGNOSIS — M5441 Lumbago with sciatica, right side: Secondary | ICD-10-CM

## 2020-09-11 DIAGNOSIS — M25551 Pain in right hip: Secondary | ICD-10-CM

## 2020-09-11 DIAGNOSIS — M51369 Other intervertebral disc degeneration, lumbar region without mention of lumbar back pain or lower extremity pain: Secondary | ICD-10-CM

## 2020-09-11 DIAGNOSIS — F1190 Opioid use, unspecified, uncomplicated: Secondary | ICD-10-CM

## 2020-09-11 DIAGNOSIS — M5442 Lumbago with sciatica, left side: Secondary | ICD-10-CM

## 2020-09-11 DIAGNOSIS — G894 Chronic pain syndrome: Secondary | ICD-10-CM

## 2020-09-11 DIAGNOSIS — M25552 Pain in left hip: Secondary | ICD-10-CM

## 2020-09-11 MED ORDER — OXYCODONE-ACETAMINOPHEN 10-325 MG PO TABS: 1.0000 | ORAL_TABLET | ORAL | 0 refills | Status: AC | PRN

## 2020-09-11 MED ORDER — OXYCODONE-ACETAMINOPHEN 10-325 MG PO TABS: 1.0000 | ORAL_TABLET | ORAL | 0 refills | Status: DC | PRN

## 2020-09-11 MED ORDER — GABAPENTIN 300 MG PO CAPS: 600.0000 mg | ORAL_CAPSULE | ORAL | 2 refills | Status: DC

## 2020-09-11 NOTE — Progress Notes (Signed)
Virtual Visit via Telephone Note  I connected with Wendy Sanchez on 09/11/20 at  1:40 PM EDT by telephone and verified that I am speaking with the correct person using two identifiers.  Location: Patient: Home Provider: [facility] control center   I discussed the limitations, risks, security and privacy concerns of performing an evaluation and management service by telephone and the availability of in person appointments. I also discussed with the patient that there may be a patient responsible charge related to this service. The patient expressed understanding and agreed to proceed.   History of Present Illness: I spoke with Wendy Sanchez today via telephone as we were unable to link for the video portion of the virtual conference and she reports that she is having a lot of discomfort as she has her prednisone daily dose reduced.  She is recently gone from 60 mg a day down to 35 at present.  She takes her Percocet up to 4 times sometimes 5 times a day and this works well for her but she feels that she definitely does better with the fifth tablet.  She has no side effects with the medication.  She continues to have right hip pain left knee pain persistent low back pain and diffuse body pain.  No change in the quality or characteristic of the pain is noted.  Otherwise she reports she is in her usual state of health.  She is unable to take anti-inflammatory secondary to her kidneys.  Review of systems: General: No fevers or chills Pulmonary: No shortness of breath or dyspnea Cardiac: No angina or palpitations or lightheadedness GI: No abdominal pain or constipation Psych: No depression    Observations/Objective:  Current Outpatient Medications:    oxyCODONE-acetaminophen (PERCOCET) 10-325 MG tablet, Take 1 tablet by mouth every 4 (four) hours as needed for pain., Disp: 150 tablet, Rfl: 0   [START ON 10/11/2020] oxyCODONE-acetaminophen (PERCOCET) 10-325 MG tablet, Take 1 tablet by mouth every 4 (four)  hours as needed for pain., Disp: 150 tablet, Rfl: 0   albuterol (PROVENTIL HFA;VENTOLIN HFA) 108 (90 Base) MCG/ACT inhaler, Inhale 2 puffs into the lungs every 6 (six) hours as needed for wheezing or shortness of breath., Disp: , Rfl:    albuterol (PROVENTIL) (2.5 MG/3ML) 0.083% nebulizer solution, Take 3 mLs (2.5 mg total) by nebulization every 6 (six) hours as needed for wheezing or shortness of breath. (Patient not taking: Reported on 05/29/2020), Disp: 75 mL, Rfl: 0   alendronate (FOSAMAX) 35 MG tablet, Take 35 mg by mouth every 7 (seven) days. Take with a full glass of water on an empty stomach., Disp: , Rfl:    Amoxicill-Rifabutin-Omeprazole (TALICIA) 99991111 MG CPDR, Take 4 tablets by mouth every 8 (eight) hours., Disp: , Rfl:    apixaban (ELIQUIS) 5 MG TABS tablet, Take 1 tablet (5 mg total) by mouth 2 (two) times daily., Disp: 60 tablet, Rfl: 0   Ascorbic Acid (VITAMIN C ADULT GUMMIES PO), Take by mouth. 2 gummies daily, Disp: , Rfl:    azelastine (ASTELIN) 0.1 % nasal spray, Place 2 sprays into both nostrils 2 (two) times daily as needed for allergies. Use in each nostril as directed, Disp: , Rfl:    carvedilol (COREG) 6.25 MG tablet, Take 1 tablet (6.25 mg total) by mouth 2 (two) times daily. (Patient not taking: Reported on 05/29/2020), Disp: 180 tablet, Rfl: 3   ciprofloxacin (CILOXAN) 0.3 % ophthalmic solution, Place 1 drop into both eyes every 4 (four) hours while awake., Disp: , Rfl:  cyclobenzaprine (FLEXERIL) 10 MG tablet, Take 10 mg by mouth at bedtime as needed for muscle spasms., Disp: , Rfl:    Dextromethorphan-guaiFENesin (CORICIDIN HBP CONGESTION/COUGH) 10-200 MG CAPS, Take 2 tablets by mouth 2 (two) times daily as needed (cough / congestion)., Disp: , Rfl:    dextromethorphan-guaiFENesin (MUCINEX DM) 30-600 MG 12hr tablet, Take 1 tablet by mouth 2 (two) times daily as needed for cough., Disp: , Rfl:    diclofenac sodium (VOLTAREN) 1 % GEL, Apply 2 g topically 4 (four) times  daily as needed (pain). , Disp: , Rfl:    diphenhydrAMINE (BENADRYL) 25 mg capsule, Take 50 mg by mouth every other day., Disp: , Rfl:    doxycycline (PERIOSTAT) 20 MG tablet, Take 20 mg by mouth 2 (two) times daily., Disp: , Rfl:    ELDERBERRY PO, 2 gummies daily, Disp: , Rfl:    fluconazole (DIFLUCAN) 100 MG tablet, Take 150 mg by mouth. MWF PRN, Disp: , Rfl:    folic acid (FOLVITE) 1 MG tablet, Take 1 mg by mouth at bedtime. , Disp: , Rfl:    [START ON 10/11/2020] gabapentin (NEURONTIN) 300 MG capsule, Take 2 capsules (600 mg total) by mouth See admin instructions. Take 600 mg by mouth twice daily, may take a third 600 mg dose as needed for pain, Disp: 180 capsule, Rfl: 2   ipratropium-albuterol (DUONEB) 0.5-2.5 (3) MG/3ML SOLN, Take 3 mLs by nebulization every 6 (six) hours as needed (shortness of breath)., Disp: , Rfl:    levocetirizine (XYZAL) 5 MG tablet, Take 5 mg by mouth at bedtime. , Disp: , Rfl:    metolazone (ZAROXOLYN) 2.5 MG tablet, Take 1 tablet (2.5 mg total) by mouth daily. Take it 1/2 hour before morning torsemide, Disp: 3 tablet, Rfl: 0   mirabegron ER (MYRBETRIQ) 50 MG TB24 tablet, Take 50 mg by mouth daily. (Patient not taking: Reported on 05/29/2020), Disp: , Rfl:    montelukast (SINGULAIR) 10 MG tablet, Take 10 mg by mouth at bedtime., Disp: , Rfl:    Multiple Vitamins-Minerals (MULTIVITAMIN GUMMIES ADULTS) CHEW, Chew 2 tablets by mouth daily. , Disp: , Rfl:    Nirmatrelvir & Ritonavir 20 x 150 MG & 10 x '100MG'$  TBPK, TAKE 2 NIRMATRELVIR TABLETS AND TAKE 1 RITONAVIR TABLET BY MOUTH TWICE DAILY FOR 5 DAYS (Patient not taking: No sig reported), Disp: 30 each, Rfl: 0   nortriptyline (PAMELOR) 50 MG capsule, Take 50 mg by mouth at bedtime., Disp: , Rfl:    nystatin (MYCOSTATIN) 100000 UNIT/ML suspension, Take 5 mLs by mouth 4 (four) times daily as needed (yeast). , Disp: , Rfl:    nystatin cream (MYCOSTATIN), Apply 1 application topically 2 (two) times daily as needed (yeast)., Disp:  , Rfl:    pantoprazole (PROTONIX) 40 MG tablet, Take 40 mg by mouth 2 (two) times daily., Disp: , Rfl:    Polyvinyl Alcohol-Povidone (REFRESH OP), Place 1 drop into both eyes daily as needed (dry eyes)., Disp: , Rfl:    potassium chloride SA (K-DUR,KLOR-CON) 20 MEQ tablet, Take 20 mEq by mouth daily., Disp: , Rfl:    predniSONE (DELTASONE) 10 MG tablet, Take 60 mg by mouth daily with breakfast. On taper, currently taking '40mg'$  daily, Disp: , Rfl:    Probiotic Product (PROBIOTIC COLON SUPPORT) CAPS, Take 1 capsule by mouth at bedtime., Disp: , Rfl:    sacubitril-valsartan (ENTRESTO) 49-51 MG, Take 0.5 tablets by mouth 2 (two) times daily., Disp: , Rfl:    sucralfate (CARAFATE) 1 G tablet,  Take 1 g by mouth 2 (two) times daily before a meal., Disp: , Rfl:    torsemide (DEMADEX) 20 MG tablet, Take 2 tablets (40 mg) by mouth once daily, Disp: 60 tablet, Rfl: 3   umeclidinium-vilanterol (ANORO ELLIPTA) 62.5-25 MCG/INH AEPB, Inhale 1 puff into the lungs daily., Disp: , Rfl:    Vitamin D, Ergocalciferol, (DRISDOL) 50000 UNITS CAPS capsule, Take 50,000 Units by mouth every Thursday. , Disp: , Rfl:    Wheat Dextrin (BENEFIBER DRINK MIX) PACK, Take by mouth. 1 packet daily, Disp: , Rfl:    Assessment and Plan:  1. DDD (degenerative disc disease), lumbar   2. Chronic bilateral low back pain with bilateral sciatica   3. Chronic, continuous use of opioids   4. Chronic pain syndrome   5. Hip pain, bilateral   6. Spinal stenosis of lumbar region with neurogenic claudication   7. Primary osteoarthritis of right shoulder   8. Chronic pain of right hip   Based on our discussion today and upon review of the [facility] practitioner database information it is appropriate for refills dated for August 31 and September 30.  I when I make this 450 tablets which will enable her for up to 5 tablets a day to be span out every 4-6 hour intervals.  She gets good relief with the Percocet and I am going to have her trial  this over the next 2 months.  At her next visit in 2 months we may consider a longer acting opioid transition depending on how she is responding to therapy.  On her to continue follow-up with her primary care physicians for baseline medical care.  Hopefully when she gets transition to a baseline steroid things will stabilize for her.  No other changes will be made in her pain management regimen.  She is to continue gabapentin at the 600 mg 3 times daily dosing. Follow Up Instructions:    I discussed the assessment and treatment plan with the patient. The patient was provided an opportunity to ask questions and all were answered. The patient agreed with the plan and demonstrated an understanding of the instructions.   The patient was advised to call back or seek an in-person evaluation if the symptoms worsen or if the condition fails to improve as anticipated.  I provided 30 minutes of non-face-to-face time during this encounter.   Molli Barrows, MD

## 2020-09-12 HISTORY — PX: PARTIAL COLECTOMY: SHX5273

## 2020-09-13 HISTORY — PX: LAPAROSCOPIC SIGMOID COLECTOMY: SHX5928

## 2020-09-23 ENCOUNTER — Ambulatory Visit
Admission: EM | Admit: 2020-09-23 | Discharge: 2020-09-23 | Disposition: A | Discharge status: Home / Self Care | Payer: Medicare Other | Attending: Physician Assistant | Admitting: Physician Assistant

## 2020-09-23 ENCOUNTER — Other Ambulatory Visit: Payer: Self-pay

## 2020-09-23 ENCOUNTER — Encounter: Payer: Self-pay | Admitting: Emergency Medicine

## 2020-09-23 DIAGNOSIS — M545 Low back pain, unspecified: Secondary | ICD-10-CM | POA: Diagnosis not present

## 2020-09-23 DIAGNOSIS — N3000 Acute cystitis without hematuria: Secondary | ICD-10-CM | POA: Insufficient documentation

## 2020-09-23 DIAGNOSIS — I9589 Other hypotension: Secondary | ICD-10-CM | POA: Diagnosis present

## 2020-09-23 LAB — URINALYSIS, COMPLETE (UACMP) WITH MICROSCOPIC
Bilirubin Urine: NEGATIVE
Glucose, UA: NEGATIVE mg/dL
Ketones, ur: NEGATIVE mg/dL
Nitrite: NEGATIVE
Specific Gravity, Urine: 1.015 (ref 1.005–1.030)
pH: 5.5 (ref 5.0–8.0)

## 2020-09-23 MED ORDER — CEPHALEXIN 500 MG PO CAPS
500.0000 mg | ORAL_CAPSULE | Freq: Four times a day (QID) | ORAL | 0 refills | Status: DC
Start: 2020-09-23 — End: 2020-11-08

## 2020-09-23 NOTE — Discharge Instructions (Addendum)
Please go to the ER since your blood pressure is so low.

## 2020-09-23 NOTE — ED Provider Notes (Signed)
[facility]    CSN: [id number] Arrival date & time: 09/23/20  1126      History   Chief Complaint Chief Complaint  Patient presents with   Abdominal Pain   Back Pain    HPI Wendy Sanchez is a 70 y.o. female who presents with dysuria, strong urine odor, urge to void but nothing comes out at time, suprapubic pain and lower back pain x 2 days. Has hx of CHF and has seen her cardiologist 2 weeks ago at which time her BP was 96/58 and he started her on Entresto and Spironolactone. She denies having dizziness or sleepiness or CP.     Past Medical History:  Diagnosis Date   Allergic rhinitis    Arthritis    Asthma    problems with fumes and aerosols cause asthma   Chest pain 10/19/2016   CHF (congestive heart failure) (HCC)    Complication of anesthesia    awakens during surgery; has occurred with last 3-4 surgeries    COPD (chronic obstructive pulmonary disease) ([place])    DVT (deep venous thrombosis) (HCC) 07/13/2018   Right leg   Environmental allergies    fumes    Fibromyalgia    GERD (gastroesophageal reflux disease)    Headache    Hemorrhoids    History of bronchitis    Hx of total knee arthroplasty 12/13/2015   Hyperlipidemia    Hypertension    Morbid obesity with BMI of 45.0-49.9, adult ([place]) 07/25/2015   NICM (nonischemic cardiomyopathy) ([place])    a. ? PVC mediated;  b. 06/2016 Echo: EF 25-30%, mild LVH, mild LAE, mild AI/MR/TR/PR; c. 08/2016 Cath: nl cors, EF 25%.   Numbness    hands bilat when driving; improves when not preforming task    Osteoarthritis of left knee 08/02/2015   PE (pulmonary thromboembolism) ([place]) 07/13/2018   Pes anserinus bursitis of left knee 05/18/2016   Presence of right artificial knee joint 05/18/2016   PVC (premature ventricular contraction) 06/04/2017   PVC's (premature ventricular contractions)    a. 11/2016 [place] started;  b. 11/29/16 48h Holter: [id number] PVC's (41%); c. 12/2016 24h Holter: 18040 PVC's (15%); c. 02/2017 48h Holter:  37262 PVC's (41%).   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   Temporal arteritis ([place]) 05/01/2020   Tinnitus    comes and goes    Unilateral primary osteoarthritis, right knee 12/13/2015   Vertigo    none for over 2 yrs    Patient Active Problem List   Diagnosis Date Noted   Pain in joint, shoulder region 07/03/2020   Temporal arteritis ([place]) 05/01/2020   Frontal headache 05/30/2019   Carcinoma, renal cell, left ([place]) 11/14/2018   Pulmonary embolism ([place]) 07/13/2018   DDD (degenerative disc disease), lumbar 06/30/2018   Chronic low back pain 06/30/2018   Hip pain, bilateral 06/30/2018   Chronic, continuous use of opioids 06/30/2018   PVC (premature ventricular contraction) 06/04/2017   Chest pain 10/19/2016   Presence of right artificial knee joint 05/18/2016   Pes anserinus bursitis of left knee 05/18/2016   Unilateral primary osteoarthritis, right knee 12/13/2015   Hx of total knee arthroplasty 12/13/2015   Osteoarthritis of left knee 08/02/2015   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   Morbid obesity with BMI of 45.0-49.9, adult ([address]) 07/25/2015   Spinal stenosis of  lumbar region 06/19/2015   Chronic pain syndrome 10/30/2014    Past Surgical History:  Procedure Laterality Date   ABDOMINAL HYSTERECTOMY  1979   left ovary remains   ABDOMINAL HYSTERECTOMY     ARTERY BIOPSY Left 06/07/2019   Procedure: BIOPSY TEMPORAL ARTERY;  Surgeon: Algernon Huxley, MD;  Location: [facility] ORS;  Service: Vascular;  Laterality: Left;   CHOLECYSTECTOMY     COLONOSCOPY     COLONOSCOPY N/A 10/30/2019   Procedure: COLONOSCOPY;  Surgeon: Lesly Rubenstein, MD;  Location: [facility] ENDOSCOPY;  Service: Endoscopy;  Laterality: N/A;   ESOPHAGOGASTRODUODENOSCOPY N/A 10/30/2019   Procedure: ESOPHAGOGASTRODUODENOSCOPY (EGD);  Surgeon: Lesly Rubenstein, MD;  Location:  [facility] ENDOSCOPY;  Service: Endoscopy;  Laterality: N/A;   fibrous tissue removed from right shoulder and back of neck      2 years ago    [place] N/A 02/28/2019   Procedure: SALIVARY GLAND BIOPSY;  Surgeon: Carloyn Manner, MD;  Location: [facility] ORS;  Service: ENT;  Laterality: N/A;   FRACTURE SURGERY     left small finger   GANGLION CYST EXCISION     GASTRIC BYPASS  1980   states had bypass with banding and band left in -   GASTRIC BYPASS OPEN     JOINT REPLACEMENT Bilateral 2017   knees   KNEE SURGERY Bilateral    both knees  2001   NECK SURGERY N/A 1205/19   ganglion cyst removal, [place]    PULMONARY THROMBECTOMY Bilateral 07/14/2018   Procedure: PULMONARY THROMBECTOMY;  Surgeon: Algernon Huxley, MD;  Location: [facility];  Service: Cardiovascular;  Laterality: Bilateral;   PVC ABLATION N/A 06/04/2017   Procedure: PVC ABLATION;  Surgeon: Evans Lance, MD;  Location: [facility];  Service: Cardiovascular;  Laterality: N/A;   RIGHT/LEFT HEART CATH AND CORONARY ANGIOGRAPHY Bilateral 08/12/2016   Procedure: Right/Left Heart Cath and Coronary Angiography;  Surgeon: Yolonda Kida, MD;  Location: [facility];  Service: Cardiovascular;  Laterality: Bilateral;   TONSILLECTOMY     age 53   TOTAL KNEE ARTHROPLASTY Left 08/02/2015   Procedure: LEFT TOTAL KNEE ARTHROPLASTY;  Surgeon: Mcarthur Rossetti, MD;  Location: [facility];  Service: Orthopedics;  Laterality: Left;   TOTAL KNEE ARTHROPLASTY Right 12/13/2015   Procedure: RIGHT TOTAL KNEE ARTHROPLASTY;  Surgeon: Mcarthur Rossetti, MD;  Location: [facility];  Service: Orthopedics;  Laterality: Right;    OB History   No obstetric history on file.      Home Medications    Prior to Admission medications   Medication Sig Start Date End Date Taking? Authorizing Provider  albuterol (PROVENTIL HFA;VENTOLIN HFA) 108 (90 Base) MCG/ACT inhaler Inhale 2 puffs into the lungs every 6 (six) hours as needed for  wheezing or shortness of breath.   Yes [provider]  alendronate (FOSAMAX) 35 MG tablet Take 35 mg by mouth every 7 (seven) days. Take with a full glass of water on an empty stomach.   Yes [provider]  apixaban (ELIQUIS) 5 MG TABS tablet Take 1 tablet (5 mg total) by mouth 2 (two) times daily. 07/22/18 09/23/20 Yes Pyreddy, Reatha Harps, MD  Ascorbic Acid (VITAMIN C ADULT GUMMIES PO) Take by mouth. 2 gummies daily   Yes [provider]  azelastine (ASTELIN) 0.1 % nasal spray Place 2 sprays into both nostrils 2 (two) times daily as needed for allergies. Use in each nostril as directed   Yes [provider]  cephALEXin (KEFLEX) 500 MG capsule Take 1 capsule (500 mg total) by mouth 4 (four) times daily. 09/23/20  Yes Rodriguez-Southworth, Sunday Spillers, PA-C  cyclobenzaprine (FLEXERIL) 10 MG tablet Take 10 mg by mouth at bedtime as needed for muscle spasms.   Yes [provider]  Dextromethorphan-guaiFENesin (CORICIDIN HBP CONGESTION/COUGH) 10-200 MG CAPS Take 2 tablets by mouth 2 (two) times daily as needed (cough / congestion).   Yes [provider]  dextromethorphan-guaiFENesin (MUCINEX DM) 30-600 MG 12hr tablet Take 1 tablet by mouth 2 (two) times daily as needed for cough.   Yes [provider]  diclofenac sodium (VOLTAREN) 1 % GEL Apply 2 g topically 4 (four) times daily as needed (pain).    Yes [provider]  diphenhydrAMINE (BENADRYL) 25 mg capsule Take 50 mg by mouth every other day.   Yes [provider]  fluconazole (DIFLUCAN) 100 MG tablet Take 150 mg by mouth. MWF PRN   Yes [provider]  folic acid (FOLVITE) 1 MG tablet Take 1 mg by mouth at bedtime.    Yes [provider]  gabapentin (NEURONTIN) 300 MG capsule Take 2 capsules (600 mg total) by mouth See admin instructions. Take 600 mg by mouth twice daily, may take a third 600 mg dose as needed for pain 10/11/20 11/10/20 Yes Molli Barrows, MD   levocetirizine (XYZAL) 5 MG tablet Take 5 mg by mouth at bedtime.    Yes [provider]  montelukast (SINGULAIR) 10 MG tablet Take 10 mg by mouth at bedtime.   Yes [provider]  Multiple Vitamins-Minerals (MULTIVITAMIN GUMMIES ADULTS) CHEW Chew 2 tablets by mouth daily.    Yes [provider]  nortriptyline (PAMELOR) 50 MG capsule Take 50 mg by mouth at bedtime.   Yes [provider]  nystatin (MYCOSTATIN) 100000 UNIT/ML suspension Take 5 mLs by mouth 4 (four) times daily as needed (yeast).    Yes [provider]  nystatin cream (MYCOSTATIN) Apply 1 application topically 2 (two) times daily as needed (yeast).   Yes [provider]  oxyCODONE-acetaminophen (PERCOCET) 10-325 MG tablet Take 1 tablet by mouth every 4 (four) hours as needed for pain. 09/11/20 10/11/20 Yes Molli Barrows, MD  oxyCODONE-acetaminophen (PERCOCET) 10-325 MG tablet Take 1 tablet by mouth every 4 (four) hours as needed for pain. 10/11/20 11/10/20 Yes Molli Barrows, MD  pantoprazole (PROTONIX) 40 MG tablet Take 40 mg by mouth 2 (two) times daily.   Yes [provider]  Polyvinyl Alcohol-Povidone (REFRESH OP) Place 1 drop into both eyes daily as needed (dry eyes).   Yes [provider]  predniSONE (DELTASONE) 10 MG tablet Take 35 mg by mouth daily with breakfast. On taper, currently taking '35mg'$  daily   Yes [provider]  sacubitril-valsartan (ENTRESTO) 49-51 MG Take 0.5 tablets by mouth 2 (two) times daily.   Yes [provider]  sucralfate (CARAFATE) 1 G tablet Take 1 g by mouth 2 (two) times daily before a meal.   Yes [provider]  torsemide (DEMADEX) 20 MG tablet Take 2 tablets (40 mg) by mouth once daily 04/12/20  Yes Deboraha Sprang, MD  umeclidinium-vilanterol (ANORO ELLIPTA) 62.5-25 MCG/INH AEPB Inhale 1 puff into the lungs daily. 12/23/17  Yes [provider]  Vitamin D, Ergocalciferol, (DRISDOL) 50000 UNITS  CAPS capsule Take 50,000 Units by mouth every Thursday.    Yes [provider]  ipratropium-albuterol (DUONEB) 0.5-2.5 (3) MG/3ML SOLN Take 3 mLs by nebulization every 6 (six)  hours as needed (shortness of breath).    [provider]  Probiotic Product (PROBIOTIC COLON SUPPORT) CAPS Take 1 capsule by mouth at bedtime.    [provider]  spironolactone (ALDACTONE) 25 MG tablet Take 12.5 mg by mouth daily. 09/18/20   [provider]  Tocilizumab (ACTEMRA) 162 MG/0.9ML SOSY Inject into the skin.    [provider]    Family History Family History  Problem Relation Age of Onset   Hypertension Father    Deep vein thrombosis Father    Dementia Mother    Diabetes Sister    Congestive Heart Failure Sister    Congestive Heart Failure Brother    Congestive Heart Failure Daughter    Congestive Heart Failure Son    Breast cancer Maternal Aunt        great aunt    Social History Social History   Tobacco Use   Smoking status: Former    Packs/day: 0.25    Years: 50.00    Pack years: 12.50    Types: Cigarettes    Quit date: 08/06/2020    Years since quitting: 0.1   Smokeless tobacco: Current    Types: Chew  Vaping Use   Vaping Use: Never used  Substance Use Topics   Alcohol use: No    Alcohol/week: 0.0 standard drinks   Drug use: No     Allergies   Hydrocodone-acetaminophen, Iodine, Other, Tape, Peanut oil, and Shellfish allergy   Review of Systems Review of Systems + lower back pain, suprapubic pain, dysuria, edema, SOB. The rest is neg.   Physical Exam Triage Vital Signs ED Triage Vitals  Enc Vitals Group     BP 09/23/20 1210 (!) 84/55     Pulse Rate 09/23/20 1210 91     Resp 09/23/20 1210 18     Temp 09/23/20 1210 98.6 F (37 C)     Temp Source 09/23/20 1210 Oral     SpO2 09/23/20 1210 99 %     Weight 09/23/20 1206 (!) 300 lb 14.9 oz (136.5 kg)     Height 09/23/20 1206 '5\' 7"'$  (1.702 m)     Head Circumference --      Peak  Flow --      Pain Score 09/23/20 1205 8     Pain Loc --      Pain Edu? --      Excl. in [place]? --    No data found.  Updated Vital Signs BP (!) 84/55 (BP Location: Left Arm) Comment: pt states her bp runs low like this sometimes. denies dizziness.  Pulse 91   Temp 98.6 F (37 C) (Oral)   Resp 18   Ht '5\' 7"'$  (1.702 m)   Wt (!) 300 lb 14.9 oz (136.5 kg)   SpO2 99%   BMI 47.13 kg/m   Visual Acuity Right Eye Distance:   Left Eye Distance:   Bilateral Distance:    Right Eye Near:   Left Eye Near:    Bilateral Near:     Physical Exam Vitals and nursing note reviewed.  Constitutional:      Appearance: She is obese.  HENT:     Head: Normocephalic.     Right Ear: External ear normal.     Left Ear: External ear normal.     Nose: Nose normal.  Eyes:     General: No scleral icterus.    Conjunctiva/sclera: Conjunctivae normal.  Cardiovascular:     Rate and Rhythm: Normal rate. Rhythm  irregular.  Pulmonary:     Breath sounds: Normal breath sounds. No wheezing or rales.     Comments: She is very SOB and cant speak full sentences  Abdominal:     General: Bowel sounds are normal.     Tenderness: There is abdominal tenderness in the suprapubic area. There is no right CVA tenderness, left CVA tenderness, guarding or rebound.  Musculoskeletal:     Cervical back: Neck supple.     Right lower leg: Edema present.     Left lower leg: Edema present.     Comments: BACK- with local tenderness throughout the entire low back. Neg CVA tenderness  Skin:    General: Skin is warm and dry.     Findings: No rash.  Neurological:     Mental Status: She is alert and oriented to person, place, and time.     Comments: Uses a cane to walk, and walks slow  Psychiatric:        Mood and Affect: Mood normal.        Behavior: Behavior normal.        Thought Content: Thought content normal.        Judgment: Judgment normal.     UC Treatments / Results  Labs (all labs ordered are listed, but only  abnormal results are displayed) Labs Reviewed  URINALYSIS, COMPLETE (UACMP) WITH MICROSCOPIC - Abnormal; Notable for the following components:      Result Value   APPearance HAZY (*)    Hgb urine dipstick TRACE (*)    Protein, ur TRACE (*)    Leukocytes,Ua TRACE (*)    Bacteria, UA MANY (*)    All other components within normal limits  URINE CULTURE    EKG Sinus rhythm with PVC's- unchanged from 8/22 EKG  Radiology No results found.  Procedures Procedures (including critical care time)  Medications Ordered in UC Medications - No data to display  Initial Impression / Assessment and Plan / UC Course  I have reviewed the triage vital signs and the nursing notes. Pertinent labs & imaging results that were available during my care of the patient were reviewed by me and considered in my medical decision making (see chart for details). I placed her on Keflex as noted. I sent her to ER for further work up of hypotension.     Final Clinical Impressions(s) / UC Diagnoses   Final diagnoses:  Acute cystitis without hematuria  Other specified hypotension  Acute bilateral low back pain without sciatica     Discharge Instructions      Please go to the ER since your blood pressure is so low.      ED Prescriptions     Medication Sig Dispense Auth. Provider   cephALEXin (KEFLEX) 500 MG capsule Take 1 capsule (500 mg total) by mouth 4 (four) times daily. 30 capsule Rodriguez-Southworth, Sunday Spillers, PA-C      PDMP not reviewed this encounter.   Shelby Mattocks, PA-C 09/23/20 1426

## 2020-09-23 NOTE — ED Triage Notes (Signed)
Pt c/o lower abdominal pain, and lower back pain. She states she has the urge to urinate but then she can not. She states she also has dysuria and a odor.

## 2020-09-25 LAB — URINE CULTURE

## 2020-11-01 ENCOUNTER — Ambulatory Visit: Payer: Medicare Other | Attending: Anesthesiology | Admitting: Anesthesiology

## 2020-11-01 ENCOUNTER — Other Ambulatory Visit: Payer: Self-pay

## 2020-11-01 ENCOUNTER — Encounter: Payer: Self-pay | Admitting: Anesthesiology

## 2020-11-01 DIAGNOSIS — M5136 Other intervertebral disc degeneration, lumbar region: Secondary | ICD-10-CM

## 2020-11-01 DIAGNOSIS — M25511 Pain in right shoulder: Secondary | ICD-10-CM

## 2020-11-01 DIAGNOSIS — M5442 Lumbago with sciatica, left side: Secondary | ICD-10-CM | POA: Diagnosis not present

## 2020-11-01 DIAGNOSIS — F1190 Opioid use, unspecified, uncomplicated: Secondary | ICD-10-CM

## 2020-11-01 DIAGNOSIS — M25552 Pain in left hip: Secondary | ICD-10-CM

## 2020-11-01 DIAGNOSIS — G8929 Other chronic pain: Secondary | ICD-10-CM

## 2020-11-01 DIAGNOSIS — M25551 Pain in right hip: Secondary | ICD-10-CM

## 2020-11-01 DIAGNOSIS — G894 Chronic pain syndrome: Secondary | ICD-10-CM

## 2020-11-01 DIAGNOSIS — M5441 Lumbago with sciatica, right side: Secondary | ICD-10-CM

## 2020-11-01 DIAGNOSIS — M316 Other giant cell arteritis: Secondary | ICD-10-CM

## 2020-11-01 DIAGNOSIS — M48062 Spinal stenosis, lumbar region with neurogenic claudication: Secondary | ICD-10-CM

## 2020-11-01 DIAGNOSIS — M51369 Other intervertebral disc degeneration, lumbar region without mention of lumbar back pain or lower extremity pain: Secondary | ICD-10-CM

## 2020-11-01 MED ORDER — OXYCODONE-ACETAMINOPHEN 10-325 MG PO TABS: 1.0000 | ORAL_TABLET | ORAL | 0 refills | Status: DC | PRN

## 2020-11-01 MED ORDER — OXYCODONE-ACETAMINOPHEN 10-325 MG PO TABS: 1.0000 | ORAL_TABLET | ORAL | 0 refills | Status: AC | PRN

## 2020-11-01 NOTE — Progress Notes (Signed)
Virtual Visit via Telephone Note  I connected with PANAYIOTA LARKIN on 11/01/20 at  1:40 PM EDT by telephone and verified that I am speaking with the correct person using two identifiers.  Location: Patient: Home Provider: Pain control center   I discussed the limitations, risks, security and privacy concerns of performing an evaluation and management service by telephone and the availability of in person appointments. I also discussed with the patient that there may be a patient responsible charge related to this service. The patient expressed understanding and agreed to proceed.   History of Present Illness:  I spoke with [facility] via telephone.  We were unable link for the video portion of the virtual conference and she states that her back pain has been stable.  She has diffuse body pain additionally and continues to take her oxycodone 10 mg tablets with acetaminophen effectively about every 4-6 hours.  She averages 150/month.  She was recently in the hospital and did have a secondary prescription per her physician in house.  She brings this to my attention today.  She still gets good relief rated about 50% but continues to have diffuse body pain comparable to what she is experienced in the past with no change in quality characteristic or distribution.  She is trying to stay active but this is been challenging especially as they reduce her steroids secondary to some autoimmune issues.  She also has temporal arteritis which is under evaluation.  No other changes are reported today in the nature of her pain and she feels she is continuing to derive good functional benefit from the medications and no side effects with the opioids are noted.  Review of systems: General: No fevers or chills Pulmonary: No shortness of breath or dyspnea Cardiac: No angina or palpitations or lightheadedness GI: No abdominal pain or constipation Psych: No depression  Observations/Objective:  Current Outpatient  Medications:    [START ON 12/26/2020] oxyCODONE-acetaminophen (PERCOCET) 10-325 MG tablet, Take 1 tablet by mouth every 4 (four) hours as needed for pain., Disp: 150 tablet, Rfl: 0   albuterol (PROVENTIL HFA;VENTOLIN HFA) 108 (90 Base) MCG/ACT inhaler, Inhale 2 puffs into the lungs every 6 (six) hours as needed for wheezing or shortness of breath., Disp: , Rfl:    alendronate (FOSAMAX) 35 MG tablet, Take 35 mg by mouth every 7 (seven) days. Take with a full glass of water on an empty stomach., Disp: , Rfl:    apixaban (ELIQUIS) 5 MG TABS tablet, Take 1 tablet (5 mg total) by mouth 2 (two) times daily., Disp: 60 tablet, Rfl: 0   Ascorbic Acid (VITAMIN C ADULT GUMMIES PO), Take by mouth. 2 gummies daily, Disp: , Rfl:    azelastine (ASTELIN) 0.1 % nasal spray, Place 2 sprays into both nostrils 2 (two) times daily as needed for allergies. Use in each nostril as directed, Disp: , Rfl:    cephALEXin (KEFLEX) 500 MG capsule, Take 1 capsule (500 mg total) by mouth 4 (four) times daily., Disp: 30 capsule, Rfl: 0   cyclobenzaprine (FLEXERIL) 10 MG tablet, Take 10 mg by mouth at bedtime as needed for muscle spasms., Disp: , Rfl:    Dextromethorphan-guaiFENesin (CORICIDIN HBP CONGESTION/COUGH) 10-200 MG CAPS, Take 2 tablets by mouth 2 (two) times daily as needed (cough / congestion)., Disp: , Rfl:    dextromethorphan-guaiFENesin (MUCINEX DM) 30-600 MG 12hr tablet, Take 1 tablet by mouth 2 (two) times daily as needed for cough., Disp: , Rfl:    diclofenac sodium (VOLTAREN) 1 %  GEL, Apply 2 g topically 4 (four) times daily as needed (pain). , Disp: , Rfl:    diphenhydrAMINE (BENADRYL) 25 mg capsule, Take 50 mg by mouth every other day., Disp: , Rfl:    fluconazole (DIFLUCAN) 100 MG tablet, Take 150 mg by mouth. MWF PRN, Disp: , Rfl:    folic acid (FOLVITE) 1 MG tablet, Take 1 mg by mouth at bedtime. , Disp: , Rfl:    gabapentin (NEURONTIN) 300 MG capsule, Take 2 capsules (600 mg total) by mouth See admin  instructions. Take 600 mg by mouth twice daily, may take a third 600 mg dose as needed for pain, Disp: 180 capsule, Rfl: 2   ipratropium-albuterol (DUONEB) 0.5-2.5 (3) MG/3ML SOLN, Take 3 mLs by nebulization every 6 (six) hours as needed (shortness of breath)., Disp: , Rfl:    levocetirizine (XYZAL) 5 MG tablet, Take 5 mg by mouth at bedtime. , Disp: , Rfl:    montelukast (SINGULAIR) 10 MG tablet, Take 10 mg by mouth at bedtime., Disp: , Rfl:    Multiple Vitamins-Minerals (MULTIVITAMIN GUMMIES ADULTS) CHEW, Chew 2 tablets by mouth daily. , Disp: , Rfl:    nortriptyline (PAMELOR) 50 MG capsule, Take 50 mg by mouth at bedtime., Disp: , Rfl:    nystatin (MYCOSTATIN) 100000 UNIT/ML suspension, Take 5 mLs by mouth 4 (four) times daily as needed (yeast). , Disp: , Rfl:    nystatin cream (MYCOSTATIN), Apply 1 application topically 2 (two) times daily as needed (yeast)., Disp: , Rfl:    [START ON 11/26/2020] oxyCODONE-acetaminophen (PERCOCET) 10-325 MG tablet, Take 1 tablet by mouth every 4 (four) hours as needed for pain., Disp: 150 tablet, Rfl: 0   pantoprazole (PROTONIX) 40 MG tablet, Take 40 mg by mouth 2 (two) times daily., Disp: , Rfl:    Polyvinyl Alcohol-Povidone (REFRESH OP), Place 1 drop into both eyes daily as needed (dry eyes)., Disp: , Rfl:    predniSONE (DELTASONE) 10 MG tablet, Take 35 mg by mouth daily with breakfast. On taper, currently taking 35mg  daily, Disp: , Rfl:    Probiotic Product (PROBIOTIC COLON SUPPORT) CAPS, Take 1 capsule by mouth at bedtime., Disp: , Rfl:    sacubitril-valsartan (ENTRESTO) 49-51 MG, Take 0.5 tablets by mouth 2 (two) times daily., Disp: , Rfl:    spironolactone (ALDACTONE) 25 MG tablet, Take 12.5 mg by mouth daily., Disp: , Rfl:    sucralfate (CARAFATE) 1 G tablet, Take 1 g by mouth 2 (two) times daily before a meal., Disp: , Rfl:    Tocilizumab (ACTEMRA) 162 MG/0.9ML SOSY, Inject into the skin., Disp: , Rfl:    torsemide (DEMADEX) 20 MG tablet, Take 2 tablets  (40 mg) by mouth once daily, Disp: 60 tablet, Rfl: 3   umeclidinium-vilanterol (ANORO ELLIPTA) 62.5-25 MCG/INH AEPB, Inhale 1 puff into the lungs daily., Disp: , Rfl:    Vitamin D, Ergocalciferol, (DRISDOL) 50000 UNITS CAPS capsule, Take 50,000 Units by mouth every Thursday. , Disp: , Rfl:    Assessment and Plan:  1. DDD (degenerative disc disease), lumbar   2. Chronic bilateral low back pain with bilateral sciatica   3. Chronic, continuous use of opioids   4. Chronic pain syndrome   5. Hip pain, bilateral   6. Spinal stenosis of lumbar region with neurogenic claudication   7. Pain in joint of right shoulder   8. Temporal arteritis ([place])   Based on our discussion today and after review of the [facility] practitioner database I think it is appropriate  for refills.  Based on her recent fill from her inpatient facility I am going to push back her next refill to November 15 and then December 15.  She continues to do well with the Percocet and no other changes will be made in her pain management protocol.  She denies any diverting or illicit use and we did check her urine which was appropriate recently.  I want her to continue exercises and efforts at weight loss which has been challenging for her.  Continue doing physical therapy exercises and following up with her primary care physician for baseline medical care with a scheduled return to clinic in 2 months. Follow Up Instructions:    I discussed the assessment and treatment plan with the patient. The patient was provided an opportunity to ask questions and all were answered. The patient agreed with the plan and demonstrated an understanding of the instructions.   The patient was advised to call back or seek an in-person evaluation if the symptoms worsen or if the condition fails to improve as anticipated.  I provided 30 minutes of non-face-to-face time during this encounter.   Molli Barrows, MD

## 2020-11-07 ENCOUNTER — Emergency Department: Payer: Medicare Other

## 2020-11-07 ENCOUNTER — Inpatient Hospital Stay
Admission: EM | Admit: 2020-11-07 | Discharge: 2020-11-17 | DRG: 393 | Disposition: A | Discharge status: Skilled Nursing Facility | Payer: Medicare Other | Source: Skilled Nursing Facility | Attending: Family Medicine | Admitting: Family Medicine

## 2020-11-07 ENCOUNTER — Other Ambulatory Visit: Payer: Self-pay

## 2020-11-07 DIAGNOSIS — Z888 Allergy status to other drugs, medicaments and biological substances status: Secondary | ICD-10-CM

## 2020-11-07 DIAGNOSIS — U071 COVID-19: Secondary | ICD-10-CM | POA: Diagnosis present

## 2020-11-07 DIAGNOSIS — R531 Weakness: Secondary | ICD-10-CM

## 2020-11-07 DIAGNOSIS — Z9049 Acquired absence of other specified parts of digestive tract: Secondary | ICD-10-CM

## 2020-11-07 DIAGNOSIS — I110 Hypertensive heart disease with heart failure: Secondary | ICD-10-CM | POA: Diagnosis present

## 2020-11-07 DIAGNOSIS — Z789 Other specified health status: Secondary | ICD-10-CM

## 2020-11-07 DIAGNOSIS — Z90710 Acquired absence of both cervix and uterus: Secondary | ICD-10-CM

## 2020-11-07 DIAGNOSIS — Z885 Allergy status to narcotic agent status: Secondary | ICD-10-CM

## 2020-11-07 DIAGNOSIS — I739 Peripheral vascular disease, unspecified: Secondary | ICD-10-CM | POA: Diagnosis present

## 2020-11-07 DIAGNOSIS — D849 Immunodeficiency, unspecified: Secondary | ICD-10-CM | POA: Diagnosis present

## 2020-11-07 DIAGNOSIS — Z96653 Presence of artificial knee joint, bilateral: Secondary | ICD-10-CM | POA: Diagnosis present

## 2020-11-07 DIAGNOSIS — E876 Hypokalemia: Secondary | ICD-10-CM | POA: Diagnosis present

## 2020-11-07 DIAGNOSIS — A419 Sepsis, unspecified organism: Secondary | ICD-10-CM | POA: Diagnosis present

## 2020-11-07 DIAGNOSIS — Z9884 Bariatric surgery status: Secondary | ICD-10-CM

## 2020-11-07 DIAGNOSIS — Z751 Person awaiting admission to adequate facility elsewhere: Secondary | ICD-10-CM

## 2020-11-07 DIAGNOSIS — Z7901 Long term (current) use of anticoagulants: Secondary | ICD-10-CM

## 2020-11-07 DIAGNOSIS — I5022 Chronic systolic (congestive) heart failure: Secondary | ICD-10-CM | POA: Diagnosis present

## 2020-11-07 DIAGNOSIS — M316 Other giant cell arteritis: Secondary | ICD-10-CM | POA: Diagnosis present

## 2020-11-07 DIAGNOSIS — I2699 Other pulmonary embolism without acute cor pulmonale: Secondary | ICD-10-CM | POA: Diagnosis present

## 2020-11-07 DIAGNOSIS — E785 Hyperlipidemia, unspecified: Secondary | ICD-10-CM | POA: Diagnosis present

## 2020-11-07 DIAGNOSIS — Z86718 Personal history of other venous thrombosis and embolism: Secondary | ICD-10-CM | POA: Diagnosis not present

## 2020-11-07 DIAGNOSIS — J449 Chronic obstructive pulmonary disease, unspecified: Secondary | ICD-10-CM | POA: Diagnosis present

## 2020-11-07 DIAGNOSIS — K219 Gastro-esophageal reflux disease without esophagitis: Secondary | ICD-10-CM | POA: Diagnosis present

## 2020-11-07 DIAGNOSIS — Z86711 Personal history of pulmonary embolism: Secondary | ICD-10-CM

## 2020-11-07 DIAGNOSIS — K9402 Colostomy infection: Principal | ICD-10-CM | POA: Diagnosis present

## 2020-11-07 DIAGNOSIS — D649 Anemia, unspecified: Secondary | ICD-10-CM | POA: Diagnosis present

## 2020-11-07 DIAGNOSIS — Z8249 Family history of ischemic heart disease and other diseases of the circulatory system: Secondary | ICD-10-CM

## 2020-11-07 DIAGNOSIS — Z87440 Personal history of urinary (tract) infections: Secondary | ICD-10-CM

## 2020-11-07 DIAGNOSIS — Z7983 Long term (current) use of bisphosphonates: Secondary | ICD-10-CM

## 2020-11-07 DIAGNOSIS — Z87891 Personal history of nicotine dependence: Secondary | ICD-10-CM

## 2020-11-07 DIAGNOSIS — L03311 Cellulitis of abdominal wall: Secondary | ICD-10-CM | POA: Diagnosis present

## 2020-11-07 DIAGNOSIS — G4730 Sleep apnea, unspecified: Secondary | ICD-10-CM | POA: Diagnosis present

## 2020-11-07 DIAGNOSIS — Z933 Colostomy status: Secondary | ICD-10-CM | POA: Diagnosis not present

## 2020-11-07 DIAGNOSIS — Z6841 Body Mass Index (BMI) 40.0 and over, adult: Secondary | ICD-10-CM | POA: Diagnosis not present

## 2020-11-07 DIAGNOSIS — M797 Fibromyalgia: Secondary | ICD-10-CM | POA: Diagnosis present

## 2020-11-07 DIAGNOSIS — I428 Other cardiomyopathies: Secondary | ICD-10-CM | POA: Diagnosis present

## 2020-11-07 DIAGNOSIS — Z8719 Personal history of other diseases of the digestive system: Secondary | ICD-10-CM

## 2020-11-07 DIAGNOSIS — R6521 Severe sepsis with septic shock: Secondary | ICD-10-CM | POA: Diagnosis present

## 2020-11-07 DIAGNOSIS — Z7952 Long term (current) use of systemic steroids: Secondary | ICD-10-CM

## 2020-11-07 DIAGNOSIS — Z7951 Long term (current) use of inhaled steroids: Secondary | ICD-10-CM

## 2020-11-07 DIAGNOSIS — N179 Acute kidney failure, unspecified: Secondary | ICD-10-CM | POA: Diagnosis present

## 2020-11-07 DIAGNOSIS — Z833 Family history of diabetes mellitus: Secondary | ICD-10-CM

## 2020-11-07 DIAGNOSIS — I959 Hypotension, unspecified: Secondary | ICD-10-CM

## 2020-11-07 DIAGNOSIS — R609 Edema, unspecified: Secondary | ICD-10-CM

## 2020-11-07 DIAGNOSIS — Z803 Family history of malignant neoplasm of breast: Secondary | ICD-10-CM

## 2020-11-07 DIAGNOSIS — Z79899 Other long term (current) drug therapy: Secondary | ICD-10-CM

## 2020-11-07 DIAGNOSIS — R000 Tachycardia, unspecified: Secondary | ICD-10-CM

## 2020-11-07 LAB — COMPREHENSIVE METABOLIC PANEL
ALT: 13 U/L (ref 0–44)
AST: 18 U/L (ref 15–41)
Albumin: 2.3 g/dL — ABNORMAL LOW (ref 3.5–5.0)
Alkaline Phosphatase: 54 U/L (ref 38–126)
Anion gap: 9 (ref 5–15)
BUN: 31 mg/dL — ABNORMAL HIGH (ref 8–23)
CO2: 28 mmol/L (ref 22–32)
Calcium: 7.8 mg/dL — ABNORMAL LOW (ref 8.9–10.3)
Chloride: 102 mmol/L (ref 98–111)
Creatinine, Ser: 1.26 mg/dL — ABNORMAL HIGH (ref 0.44–1.00)
GFR, Estimated: 46 mL/min — ABNORMAL LOW (ref >60)
Glucose, Bld: 120 mg/dL — ABNORMAL HIGH (ref 70–99)
Potassium: 3.4 mmol/L — ABNORMAL LOW (ref 3.5–5.1)
Sodium: 139 mmol/L (ref 135–145)
Total Bilirubin: 0.5 mg/dL (ref 0.3–1.2)
Total Protein: 5.7 g/dL — ABNORMAL LOW (ref 6.5–8.1)

## 2020-11-07 LAB — CBC WITH DIFFERENTIAL/PLATELET
Abs Immature Granulocytes: 0.11 10*3/uL — ABNORMAL HIGH (ref 0.00–0.07)
Basophils Absolute: 0 10*3/uL (ref 0.0–0.1)
Basophils Relative: 0 %
Eosinophils Absolute: 0 10*3/uL (ref 0.0–0.5)
Eosinophils Relative: 0 %
HCT: 28.1 % — ABNORMAL LOW (ref 36.0–46.0)
Hemoglobin: 8.8 g/dL — ABNORMAL LOW (ref 12.0–15.0)
Immature Granulocytes: 1 %
Lymphocytes Relative: 10 %
Lymphs Abs: 1.1 10*3/uL (ref 0.7–4.0)
MCH: 27.7 pg (ref 26.0–34.0)
MCHC: 31.3 g/dL (ref 30.0–36.0)
MCV: 88.4 fL (ref 80.0–100.0)
Monocytes Absolute: 0.2 10*3/uL (ref 0.1–1.0)
Monocytes Relative: 2 %
Neutro Abs: 9.1 10*3/uL — ABNORMAL HIGH (ref 1.7–7.7)
Neutrophils Relative %: 87 %
Platelets: 289 10*3/uL (ref 150–400)
RBC: 3.18 MIL/uL — ABNORMAL LOW (ref 3.87–5.11)
RDW: 16.9 % — ABNORMAL HIGH (ref 11.5–15.5)
WBC: 10.7 10*3/uL — ABNORMAL HIGH (ref 4.0–10.5)
nRBC: 0 % (ref 0.0–0.2)

## 2020-11-07 LAB — CBG MONITORING, ED: Glucose-Capillary: 129 mg/dL — ABNORMAL HIGH (ref 70–99)

## 2020-11-07 LAB — LACTIC ACID, PLASMA
Lactic Acid, Venous: 2.7 mmol/L (ref 0.5–1.9)
Lactic Acid, Venous: 2.7 mmol/L (ref 0.5–1.9)

## 2020-11-07 MED ORDER — UMECLIDINIUM-VILANTEROL 62.5-25 MCG/ACT IN AEPB
1.0000 | INHALATION_SPRAY | Freq: Every day | RESPIRATORY_TRACT | Status: DC
  Administered 2020-11-08: 1 via RESPIRATORY_TRACT
  Filled 2020-11-07: qty 14

## 2020-11-07 MED ORDER — POTASSIUM CHLORIDE 20 MEQ PO PACK
40.0000 meq | PACK | Freq: Two times a day (BID) | ORAL | Status: DC
  Administered 2020-11-08 – 2020-11-09 (×5): 40 meq via ORAL
  Filled 2020-11-07 (×6): qty 2

## 2020-11-07 MED ORDER — VANCOMYCIN HCL IN DEXTROSE 1-5 GM/200ML-% IV SOLN
1000.0000 mg | Freq: Once | INTRAVENOUS | Status: AC
  Administered 2020-11-07: 1000 mg via INTRAVENOUS
  Filled 2020-11-07: qty 200

## 2020-11-07 MED ORDER — APIXABAN 5 MG PO TABS
5.0000 mg | ORAL_TABLET | Freq: Two times a day (BID) | ORAL | Status: DC
  Administered 2020-11-08 (×2): 5 mg via ORAL
  Filled 2020-11-07 (×2): qty 1

## 2020-11-07 MED ORDER — ALBUTEROL SULFATE (2.5 MG/3ML) 0.083% IN NEBU: 2.5000 mg | INHALATION_SOLUTION | Freq: Four times a day (QID) | RESPIRATORY_TRACT | Status: DC | PRN

## 2020-11-07 MED ORDER — SODIUM CHLORIDE 0.9 % IV SOLN: 250.0000 mL | INTRAVENOUS | Status: DC

## 2020-11-07 MED ORDER — LACTATED RINGERS IV BOLUS
1000.0000 mL | Freq: Once | INTRAVENOUS | Status: AC
  Administered 2020-11-07: 1000 mL via INTRAVENOUS

## 2020-11-07 MED ORDER — NOREPINEPHRINE 4 MG/250ML-% IV SOLN
2.0000 ug/min | INTRAVENOUS | Status: DC
  Administered 2020-11-07: 2 ug/min via INTRAVENOUS
  Filled 2020-11-07: qty 250

## 2020-11-07 MED ORDER — METRONIDAZOLE 500 MG/100ML IV SOLN
500.0000 mg | Freq: Once | INTRAVENOUS | Status: AC
  Administered 2020-11-08: 500 mg via INTRAVENOUS
  Filled 2020-11-07: qty 100

## 2020-11-07 MED ORDER — LACTATED RINGERS IV SOLN: INTRAVENOUS | Status: DC

## 2020-11-07 MED ORDER — SODIUM CHLORIDE 0.9 % IV SOLN
2.0000 g | Freq: Once | INTRAVENOUS | Status: AC
  Administered 2020-11-07: 2 g via INTRAVENOUS
  Filled 2020-11-07: qty 2

## 2020-11-07 MED ORDER — DOCUSATE SODIUM 100 MG PO CAPS: 100.0000 mg | ORAL_CAPSULE | Freq: Two times a day (BID) | ORAL | Status: DC | PRN

## 2020-11-07 MED ORDER — POLYETHYLENE GLYCOL 3350 17 G PO PACK: 17.0000 g | PACK | Freq: Every day | ORAL | Status: DC | PRN

## 2020-11-07 NOTE — ED Provider Notes (Signed)
[facility] Emergency Department Provider Note   ____________________________________________   I have reviewed the triage vital signs and the nursing notes.   HISTORY  Chief Complaint Weakness   History limited by: Not Limited   HPI Wendy Sanchez is a 70 y.o. female who presents to the emergency department today because of concerns for weakness and low blood pressure.  Patient states that she started having some low blood pressures over the past couple of days.  Yesterday she noticed that her urine was darker in color.  She does have a history of urinary tract infections.  Patient states when they took her blood pressure today they noted to be quite low.  The patient denies any shortness of breath or chest pain.  Patient was reported to have fever at rehab facility. Underwent colectomy secondary to diverticulitis at [facility] roughly 6 weeks ago.   Records reviewed. Per medical record review patient has a history of diverticulitis, colectomy.  Past Medical History:  Diagnosis Date   Allergic rhinitis    Arthritis    Asthma    problems with fumes and aerosols cause asthma   Chest pain 10/19/2016   CHF (congestive heart failure) (HCC)    Complication of anesthesia    awakens during surgery; has occurred with last 3-4 surgeries    COPD (chronic obstructive pulmonary disease) ([place])    DVT (deep venous thrombosis) (HCC) 07/13/2018   Right leg   Environmental allergies    fumes    Fibromyalgia    GERD (gastroesophageal reflux disease)    Headache    Hemorrhoids    History of bronchitis    Hx of total knee arthroplasty 12/13/2015   Hyperlipidemia    Hypertension    Morbid obesity with BMI of 45.0-49.9, adult ([place]) 07/25/2015   NICM (nonischemic cardiomyopathy) ([place])    a. ? PVC mediated;  b. 06/2016 Echo: EF 25-30%, mild LVH, mild LAE, mild AI/MR/TR/PR; c. 08/2016 Cath: nl cors, EF 25%.   Numbness    hands bilat when driving; improves when not preforming task     Osteoarthritis of left knee 08/02/2015   PE (pulmonary thromboembolism) ([place]) 07/13/2018   Pes anserinus bursitis of left knee 05/18/2016   Presence of right artificial knee joint 05/18/2016   PVC (premature ventricular contraction) 06/04/2017   PVC's (premature ventricular contractions)    a. 11/2016 [place] started;  b. 11/29/16 48h Holter: 81191 PVC's (41%); c. 12/2016 24h Holter: 18040 PVC's (15%); c. 02/2017 48h Holter: 37262 PVC's (41%).   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   Temporal arteritis ([place]) 05/01/2020   Tinnitus    comes and goes    Unilateral primary osteoarthritis, right knee 12/13/2015   Vertigo    none for over 2 yrs    Patient Active Problem List   Diagnosis Date Noted   Pain in joint, shoulder region 07/03/2020   Temporal arteritis ([place]) 05/01/2020   Frontal headache 05/30/2019   Carcinoma, renal cell, left ([place]) 11/14/2018   Pulmonary embolism ([place]) 07/13/2018   DDD (degenerative disc disease), lumbar 06/30/2018   Chronic low back pain 06/30/2018   Hip pain, bilateral 06/30/2018   Chronic, continuous use of opioids 06/30/2018   PVC (premature ventricular contraction) 06/04/2017   Chest pain 10/19/2016   Presence of right artificial knee joint 05/18/2016   Pes anserinus bursitis  of left knee 05/18/2016   Unilateral primary osteoarthritis, right knee 12/13/2015   Hx of total knee arthroplasty 12/13/2015   Osteoarthritis of left knee 08/02/2015   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   Morbid obesity with BMI of 45.0-49.9, adult ([place]) 07/25/2015   Spinal stenosis of lumbar region 06/19/2015   Chronic pain syndrome 10/30/2014    Past Surgical History:  Procedure Laterality Date   ABDOMINAL HYSTERECTOMY  1979   left ovary remains   ABDOMINAL HYSTERECTOMY     ARTERY BIOPSY Left 06/07/2019   Procedure: BIOPSY TEMPORAL  ARTERY;  Surgeon: Algernon Huxley, MD;  Location: [facility] ORS;  Service: Vascular;  Laterality: Left;   CHOLECYSTECTOMY     COLONOSCOPY     COLONOSCOPY N/A 10/30/2019   Procedure: COLONOSCOPY;  Surgeon: Lesly Rubenstein, MD;  Location: [facility] ENDOSCOPY;  Service: Endoscopy;  Laterality: N/A;   ESOPHAGOGASTRODUODENOSCOPY N/A 10/30/2019   Procedure: ESOPHAGOGASTRODUODENOSCOPY (EGD);  Surgeon: Lesly Rubenstein, MD;  Location: [facility] ENDOSCOPY;  Service: Endoscopy;  Laterality: N/A;   fibrous tissue removed from right shoulder and back of neck      2 years ago    [place] N/A 02/28/2019   Procedure: SALIVARY GLAND BIOPSY;  Surgeon: Carloyn Manner, MD;  Location: [facility] ORS;  Service: ENT;  Laterality: N/A;   FRACTURE SURGERY     left small finger   GANGLION CYST EXCISION     GASTRIC BYPASS  1980   states had bypass with banding and band left in -   GASTRIC BYPASS OPEN     JOINT REPLACEMENT Bilateral 2017   knees   KNEE SURGERY Bilateral    both knees  2001   NECK SURGERY N/A 1205/19   ganglion cyst removal, [place]    PULMONARY THROMBECTOMY Bilateral 07/14/2018   Procedure: PULMONARY THROMBECTOMY;  Surgeon: Algernon Huxley, MD;  Location: [place] CV LAB;  Service: Cardiovascular;  Laterality: Bilateral;   PVC ABLATION N/A 06/04/2017   Procedure: PVC ABLATION;  Surgeon: Evans Lance, MD;  Location: [place] [facility];  Service: Cardiovascular;  Laterality: N/A;   RIGHT/LEFT HEART CATH AND CORONARY ANGIOGRAPHY Bilateral 08/12/2016   Procedure: Right/Left Heart Cath and Coronary Angiography;  Surgeon: Yolonda Kida, MD;  Location: [place] CV LAB;  Service: Cardiovascular;  Laterality: Bilateral;   TONSILLECTOMY     age 62   TOTAL KNEE ARTHROPLASTY Left 08/02/2015   Procedure: LEFT TOTAL KNEE ARTHROPLASTY;  Surgeon: Mcarthur Rossetti, MD;  Location: [facility] ORS;  Service: Orthopedics;  Laterality: Left;   TOTAL KNEE ARTHROPLASTY Right 12/13/2015   Procedure: RIGHT TOTAL  KNEE ARTHROPLASTY;  Surgeon: Mcarthur Rossetti, MD;  Location: [facility] ORS;  Service: Orthopedics;  Laterality: Right;    Prior to Admission medications   Medication Sig Start Date End Date Taking? Authorizing Provider  albuterol (PROVENTIL HFA;VENTOLIN HFA) 108 (90 Base) MCG/ACT inhaler Inhale 2 puffs into the lungs every 6 (six) hours as needed for wheezing or shortness of breath.    [provider]  alendronate (FOSAMAX) 35 MG tablet Take 35 mg by mouth every 7 (seven) days. Take with a full glass of water on an empty stomach.    [provider]  apixaban (ELIQUIS) 5 MG TABS tablet Take 1 tablet (5 mg total) by mouth 2 (two) times daily. 07/22/18 09/23/20  Saundra Shelling, MD  Ascorbic Acid (VITAMIN C ADULT GUMMIES PO) Take by mouth. 2 gummies daily    [provider]  azelastine (ASTELIN) 0.1 % nasal spray Place 2 sprays into both nostrils 2 (two) times daily as needed for allergies. Use in each nostril as directed    [provider]  cephALEXin (KEFLEX) 500 MG capsule Take 1 capsule (500 mg total) by mouth 4 (four) times daily. 09/23/20   Rodriguez-Southworth, Sunday Spillers, PA-C  cyclobenzaprine (FLEXERIL) 10 MG tablet Take 10 mg by mouth at bedtime as needed for muscle spasms.    [provider]  Dextromethorphan-guaiFENesin (CORICIDIN HBP CONGESTION/COUGH) 10-200 MG CAPS Take 2 tablets by mouth 2 (two) times daily as needed (cough / congestion).    [provider]  dextromethorphan-guaiFENesin (MUCINEX DM) 30-600 MG 12hr tablet Take 1 tablet by mouth 2 (two) times daily as needed for cough.    [provider]  diclofenac sodium (VOLTAREN) 1 % GEL Apply 2 g topically 4 (four) times daily as needed (pain).     [provider]  diphenhydrAMINE (BENADRYL) 25 mg capsule Take 50 mg by mouth every other day.    [provider]  fluconazole (DIFLUCAN) 100 MG tablet Take 150 mg by mouth. MWF PRN    [provider]  folic  acid (FOLVITE) 1 MG tablet Take 1 mg by mouth at bedtime.     [provider]  gabapentin (NEURONTIN) 300 MG capsule Take 2 capsules (600 mg total) by mouth See admin instructions. Take 600 mg by mouth twice daily, may take a third 600 mg dose as needed for pain 10/11/20 11/10/20  Molli Barrows, MD  ipratropium-albuterol (DUONEB) 0.5-2.5 (3) MG/3ML SOLN Take 3 mLs by nebulization every 6 (six) hours as needed (shortness of breath).    [provider]  levocetirizine (XYZAL) 5 MG tablet Take 5 mg by mouth at bedtime.     [provider]  montelukast (SINGULAIR) 10 MG tablet Take 10 mg by mouth at bedtime.    [provider]  Multiple Vitamins-Minerals (MULTIVITAMIN GUMMIES ADULTS) CHEW Chew 2 tablets by mouth daily.     [provider]  nortriptyline (PAMELOR) 50 MG capsule Take 50 mg by mouth at bedtime.    [provider]  nystatin (MYCOSTATIN) 100000 UNIT/ML suspension Take 5 mLs by mouth 4 (four) times daily as needed (yeast).     [provider]  nystatin cream (MYCOSTATIN) Apply 1 application topically 2 (two) times daily as needed (yeast).    [provider]  oxyCODONE-acetaminophen (PERCOCET) 10-325 MG tablet Take 1 tablet by mouth every 4 (four) hours as needed for pain. 11/26/20 12/26/20  Molli Barrows, MD  oxyCODONE-acetaminophen (PERCOCET) 10-325 MG tablet Take 1 tablet by mouth every 4 (four) hours as needed for pain. 12/26/20 01/25/21  Molli Barrows, MD  pantoprazole (PROTONIX) 40 MG tablet Take 40 mg by mouth 2 (two) times daily.    [provider]  Polyvinyl Alcohol-Povidone (REFRESH OP) Place 1 drop into both eyes daily as needed (dry eyes).    [provider]  predniSONE (DELTASONE) 10 MG tablet Take 35 mg by mouth daily with breakfast. On taper, currently taking 35mg  daily    [provider]  Probiotic Product (PROBIOTIC COLON SUPPORT) CAPS Take 1 capsule by mouth at bedtime.     [provider]  sacubitril-valsartan (ENTRESTO) 49-51 MG Take 0.5 tablets by mouth 2 (two) times daily.    [provider]  spironolactone (ALDACTONE) 25 MG tablet Take 12.5 mg by mouth daily. 09/18/20   [provider]  sucralfate (CARAFATE) 1 G tablet  Take 1 g by mouth 2 (two) times daily before a meal.    [provider]  Tocilizumab (ACTEMRA) 162 MG/0.9ML SOSY Inject into the skin.    [provider]  torsemide (DEMADEX) 20 MG tablet Take 2 tablets (40 mg) by mouth once daily 04/12/20   Deboraha Sprang, MD  umeclidinium-vilanterol [facility] ELLIPTA) 62.5-25 MCG/INH AEPB Inhale 1 puff into the lungs daily. 12/23/17   [provider]  Vitamin D, Ergocalciferol, (DRISDOL) 50000 UNITS CAPS capsule Take 50,000 Units by mouth every Thursday.     [provider]    Allergies Hydrocodone-acetaminophen, Iodine, Other, Tape, Peanut oil, and Shellfish allergy  Family History  Problem Relation Age of Onset   Hypertension Father    Deep vein thrombosis Father    Dementia Mother    Diabetes Sister    Congestive Heart Failure Sister    Congestive Heart Failure Brother    Congestive Heart Failure Daughter    Congestive Heart Failure Son    Breast cancer Maternal Aunt        great aunt    Social History Social History   Tobacco Use   Smoking status: Former    Packs/day: 0.25    Years: 50.00    Pack years: 12.50    Types: Cigarettes    Quit date: 08/06/2020    Years since quitting: 0.2   Smokeless tobacco: Current    Types: Chew  Vaping Use   Vaping Use: Never used  Substance Use Topics   Alcohol use: No    Alcohol/week: 0.0 standard drinks   Drug use: No    Review of Systems Constitutional: Positive for fever. Positive for weakness. Eyes: No visual changes. ENT: No sore throat. Cardiovascular: Denies chest pain. Respiratory: Denies shortness of breath. Gastrointestinal: No abdominal pain.  No nausea, no vomiting.  No  diarrhea.   Genitourinary: Positive for dark urine. Musculoskeletal: Negative for back pain. Skin: Negative for rash. Neurological: Negative for headaches, focal weakness or numbness.  ____________________________________________   PHYSICAL EXAM:  VITAL SIGNS: ED Triage Vitals  Enc Vitals Group     BP 80/56     Pulse 112     Resp 13     Temp 99.7     Temp src      SpO2 99   Constitutional: Alert and oriented.  Eyes: Conjunctivae are normal.  ENT      Head: Normocephalic and atraumatic.      Nose: No congestion/rhinnorhea.      Mouth/Throat: Mucous membranes are moist.      Neck: No stridor. Hematological/Lymphatic/Immunilogical: No cervical lymphadenopathy. Cardiovascular: Normal rate, regular rhythm.  No murmurs, rubs, or gallops.  Respiratory: Normal respiratory effort without tachypnea nor retractions. Breath sounds are clear and equal bilaterally. No wheezes/rales/rhonchi. Gastrointestinal: Soft. Ostomy in place. No abdominal tenderness. Genitourinary: Deferred Musculoskeletal: Normal range of motion in all extremities. No lower extremity edema. Neurologic:  Normal speech and language. No gross focal neurologic deficits are appreciated.  Skin:  Skin is warm, dry and intact. No rash noted. Psychiatric: Mood and affect are normal. Speech and behavior are normal. Patient exhibits appropriate insight and judgment.  ____________________________________________    LABS (pertinent positives/negatives)  CBC wbc 10.7, hgb 8.8, plt 289 CMP na 139, k 3.4, glu 120, cr 1.26 Lactic 2.7 ____________________________________________   EKG  I, Nance Pear, attending physician, personally viewed and interpreted this EKG  EKG Time: 1844 Rate: 124 Rhythm: sinus tachycardia Axis: left axis devation Intervals: qtc 447  QRS: narrow ST changes: no st elevation Impression: abnormal ekg ____________________________________________    RADIOLOGY  CXR No active  disease  ____________________________________________   PROCEDURES  Procedures  CRITICAL CARE Performed by: Nance Pear   Total critical care time: 35 minutes  Critical care time was exclusive of separately billable procedures and treating other patients.  Critical care was necessary to treat or prevent imminent or life-threatening deterioration.  Critical care was time spent personally by me on the following activities: development of treatment plan with patient and/or surrogate as well as nursing, discussions with consultants, evaluation of patient's response to treatment, examination of patient, obtaining history from patient or surrogate, ordering and performing treatments and interventions, ordering and review of laboratory studies, ordering and review of radiographic studies, pulse oximetry and re-evaluation of patient's condition.  ____________________________________________   INITIAL IMPRESSION / ASSESSMENT AND PLAN / ED COURSE  Pertinent labs & imaging results that were available during my care of the patient were reviewed by me and considered in my medical decision making (see chart for details).   Patient presented to the emergency department today because of concerns for weakness and low blood pressure.  Patient's blood pressure was low here.  She was given 2 L of lactated Ringer's bolus and continued to have a low blood pressure.  Because of this she was started on peripheral Levophed.  Patient was also given broad-spectrum antibiotics because of concerns for sepsis.  Patient did complain of some urinary changes states she has had urinary tract infections in the past.  She also had colectomy done roughly 6 weeks ago.  Abdomen is benign.  At this time I think be unlikely postsurgical abscess.  Given requirement for peripheral pressors will discuss with ICU.   ____________________________________________   FINAL CLINICAL IMPRESSION(S) / ED DIAGNOSES  Final  diagnoses:  Weakness  Hypotension, unspecified hypotension type     Note: This dictation was prepared with Dragon dictation. Any transcriptional errors that result from this process are unintentional     Nance Pear, MD 11/07/20 2253

## 2020-11-07 NOTE — ED Notes (Signed)
Pt presents from [facility] facility via [facility] for weakness and fever. Pt was 104.4 at facility and gave 1g tylenol prior to presentation. Per EMS, rehab facility reported pt had more altered mentation.  EMS: Temp 103 axillary, HR 126, BP 99/43, Resp: 16 Pt reports of headache  No numbness and tingling.

## 2020-11-07 NOTE — ED Notes (Signed)
MD Archie Balboa notified of Lactic Acid 2.7

## 2020-11-07 NOTE — H&P (Addendum)
NAME:  Wendy Sanchez, MRN:  [id number], DOB:  01/24/1950, LOS: 0 ADMISSION DATE:  11/07/2020, CONSULTATION DATE:  11/07/20 REFERRING MD:  Nance Pear, MD CHIEF COMPLAINT:  Weakness   History of Present Illness:  Wendy Sanchez is a 70 year old woman, former smoker with COPD, nonischemic cardiomyopathy EF 50%, peripheral vascular disease, DVT/PE on Eliquis with recent perforated sigmoid diverticulitis status post open sigmoidectomy and colostomy placement at [facility] on 09/26/2020 who comes to [facility] ED with weakness and low blood pressure from her rehab facility.  She reports that she has felt weak today more so in her lower extremities.  She feels this way when her blood pressure is low which she has noted recently as the cardiology team is titrating her heart failure regimen.  She also reports discomfort lower abdomen/pelvic region and discomfort with urination.  She reports having increased ostomy output with liquid stools and also more foul-smelling than normal.  She was recently started on cefdinir for concern of soft tissue infection around her ostomy site.  She reports being on many different types of antibiotics prior to her admission in September and since then.  She denies any chest pain or shortness of breath.  She denies any nausea or vomiting.  She does feel feverish and had chills earlier today.  She is reported to have fever of 104.4 at the facility earlier today.  She has been afebrile since arrival in the ER.  Her blood pressure maps have been in the 50s despite 2 L of crystalloid fluid resuscitation and she was started on peripheral Levophed.  She was given cefepime and vancomycin for empiric coverage.  Her creatinine is elevated at 1.26 with a baseline of 0.74-1.  Lactic acid is 2.7.  EKG showed sinus tachycardia and no ST or T wave abnormalities. Chest radiograph is clear.  PCCM has been consulted for admission for concern of septic shock.  Pertinent  Medical History   -Congestive heart failure -Morbid obesity -DVT/PE -Sleep apnea not on CPAP -Perforated sigmoid diverticulitis status post sigmoidectomy and colostomy  Significant Hospital Events: Including procedures, antibiotic start and stop dates in addition to other pertinent events   10/27 admitted to ICU  Interim History / Subjective:    Objective   Blood pressure (!) 93/50, pulse (!) 103, temperature 99 F (37.2 C), temperature source Oral, resp. rate 18, height 5\' 7"  (1.702 m), weight (!) 142.9 kg, SpO2 97 %.        Intake/Output Summary (Last 24 hours) at 11/07/2020 2236 Last data filed at 11/07/2020 2216 Gross per 24 hour  Intake 2298 ml  Output --  Net 2298 ml   Filed Weights   11/07/20 1938  Weight: (!) 142.9 kg    Examination: General: Obese woman, no acute distress HENT: [place]/AT, moist mucous membranes, sclera anicteric, PERRL Lungs: Clear to auscultation bilaterally, no wheezing or rhonchi. Cardiovascular: Regular rate and rhythm, no murmur Abdomen: Soft, nondistended, nontender.  Midline surgical incision is clean/dry with well-healing tissue.  Left lower quadrant ostomy in place with stool in bag. Extremities: 1+ edema bilateral lower extremities Neuro: Alert, oriented x3, moving all extremities GU: Pure wick catheter in place  Resolved Hospital Problem list     Assessment & Plan:  Septic shock Possible urinary tract infection vs colitis vs soft tissue infection around ostomy site -Continue peripheral Levophed -Check random cortisol level -Check limited echo -Has received adequate fluid resuscitation at this time.  We will proceed with fluid boluses as needed. -  Has received Cefepime + vancomycin + metronidazole. Will await urinalysis and C. Diff PCR testing prior to further antibiotics. -Follow-up blood cultures.  Check urinalysis and urine culture.  Acute kidney injury Hypokalemia Likely prerenal etiology -Follow-up urinalysis -Check renal  ultrasound -Instructed nurse to perform straight cath or place Foley catheter as patient has not voided yet in order to obtain urine samples -Replete potassium  Lactic acidosis In setting of septic shock and acute kidney injury -Fluid resuscitation as above -Trend lactate  Congestive heart failure -Check BNP -Hold Entresto and spironolactone  Surgical abdominal wound Colostomy -We will consult wound care/colostomy nurse for further recommendations and management  COPD -Not in exacerbation -Continue Anoro daily -As needed albuterol  DVT/PE - continue eliquis  Obesity  Best Practice (right click and "Reselect all SmartList Selections" daily)   Diet/type: clear liquids DVT prophylaxis: DOAC GI prophylaxis: N/A Lines: N/A Foley:  N/A Code Status:  full code Last date of multidisciplinary goals of care discussion [10/27 at bedside with son present]  Labs   CBC: Recent Labs  Lab 11/07/20 1905  WBC 10.7*  NEUTROABS 9.1*  HGB 8.8*  HCT 28.1*  MCV 88.4  PLT 433    Basic Metabolic Panel: Recent Labs  Lab 11/07/20 1905  NA 139  K 3.4*  CL 102  CO2 28  GLUCOSE 120*  BUN 31*  CREATININE 1.26*  CALCIUM 7.8*   GFR: Estimated Creatinine Clearance: 62.6 mL/min (A) (by C-G formula based on SCr of 1.26 mg/dL (H)). Recent Labs  Lab 11/07/20 1905 11/07/20 1912 11/07/20 2113  WBC 10.7*  --   --   LATICACIDVEN  --  2.7* 2.7*    Liver Function Tests: Recent Labs  Lab 11/07/20 1905  AST 18  ALT 13  ALKPHOS 54  BILITOT 0.5  PROT 5.7*  ALBUMIN 2.3*   No results for input(s): LIPASE, AMYLASE in the last 168 hours. No results for input(s): AMMONIA in the last 168 hours.  ABG No results found for: PHART, PCO2ART, PO2ART, HCO3, TCO2, ACIDBASEDEF, O2SAT   Coagulation Profile: No results for input(s): INR, PROTIME in the last 168 hours.  Cardiac Enzymes: No results for input(s): CKTOTAL, CKMB, CKMBINDEX, TROPONINI in the last 168 hours.  HbA1C: No  results found for: HGBA1C  CBG: Recent Labs  Lab 11/07/20 1905  GLUCAP 129*    Review of Systems:   Review of Systems  Constitutional:  Positive for chills, fever and malaise/fatigue. Negative for weight loss.  HENT:  Negative for congestion, sinus pain and sore throat.   Eyes: Negative.   Respiratory:  Negative for cough, hemoptysis, sputum production, shortness of breath and wheezing.   Cardiovascular:  Positive for leg swelling. Negative for chest pain, palpitations, orthopnea and claudication.  Gastrointestinal:  Positive for abdominal pain. Negative for heartburn, nausea and vomiting.  Genitourinary:  Positive for dysuria.  Musculoskeletal:  Negative for joint pain and myalgias.  Skin:  Negative for rash.  Neurological:  Negative for weakness.  Endo/Heme/Allergies: Negative.   Psychiatric/Behavioral: Negative.    \ Past Medical History:  She,  has a past medical history of Allergic rhinitis, Arthritis, Asthma, Chest pain (10/19/2016), CHF (congestive heart failure) ([place]), Complication of anesthesia, COPD (chronic obstructive pulmonary disease) ([place]), DVT (deep venous thrombosis) ([place]) (07/13/2018), Environmental allergies, Fibromyalgia, GERD (gastroesophageal reflux disease), Headache, Hemorrhoids, History of bronchitis, total knee arthroplasty (12/13/2015), Hyperlipidemia, Hypertension, Morbid obesity with BMI of 45.0-49.9, adult ([place]) (07/25/2015), NICM (nonischemic cardiomyopathy) ([place]), Numbness, Osteoarthritis of left knee (08/02/2015), PE (pulmonary thromboembolism) (  [place]) (07/13/2018), Pes anserinus bursitis of left knee (05/18/2016), Presence of right artificial knee joint (05/18/2016), PVC (premature ventricular contraction) (06/04/2017), PVC's (premature ventricular contractions), Sleep apnea, Spinal stenosis of lumbar region (06/19/2015), Status post gastric banding, Status post total left knee replacement (08/02/2015), Temporal arteritis ([place]) (05/01/2020), Tinnitus, Unilateral primary  osteoarthritis, right knee (12/13/2015), and Vertigo.   Surgical History:   Past Surgical History:  Procedure Laterality Date   ABDOMINAL HYSTERECTOMY  1979   left ovary remains   ABDOMINAL HYSTERECTOMY     ARTERY BIOPSY Left 06/07/2019   Procedure: BIOPSY TEMPORAL ARTERY;  Surgeon: Algernon Huxley, MD;  Location: [facility] ORS;  Service: Vascular;  Laterality: Left;   CHOLECYSTECTOMY     COLONOSCOPY     COLONOSCOPY N/A 10/30/2019   Procedure: COLONOSCOPY;  Surgeon: Lesly Rubenstein, MD;  Location: [facility] ENDOSCOPY;  Service: Endoscopy;  Laterality: N/A;   ESOPHAGOGASTRODUODENOSCOPY N/A 10/30/2019   Procedure: ESOPHAGOGASTRODUODENOSCOPY (EGD);  Surgeon: Lesly Rubenstein, MD;  Location: [facility] ENDOSCOPY;  Service: Endoscopy;  Laterality: N/A;   fibrous tissue removed from right shoulder and back of neck      2 years ago    [place] N/A 02/28/2019   Procedure: SALIVARY GLAND BIOPSY;  Surgeon: Carloyn Manner, MD;  Location: [facility] ORS;  Service: ENT;  Laterality: N/A;   FRACTURE SURGERY     left small finger   GANGLION CYST EXCISION     GASTRIC BYPASS  1980   states had bypass with banding and band left in -   GASTRIC BYPASS OPEN     JOINT REPLACEMENT Bilateral 2017   knees   KNEE SURGERY Bilateral    both knees  2001   NECK SURGERY N/A 1205/19   ganglion cyst removal, [place]    PULMONARY THROMBECTOMY Bilateral 07/14/2018   Procedure: PULMONARY THROMBECTOMY;  Surgeon: Algernon Huxley, MD;  Location: [place] [facility];  Service: Cardiovascular;  Laterality: Bilateral;   PVC ABLATION N/A 06/04/2017   Procedure: PVC ABLATION;  Surgeon: Evans Lance, MD;  Location: [place] [facility];  Service: Cardiovascular;  Laterality: N/A;   RIGHT/LEFT HEART CATH AND CORONARY ANGIOGRAPHY Bilateral 08/12/2016   Procedure: Right/Left Heart Cath and Coronary Angiography;  Surgeon: Yolonda Kida, MD;  Location: [place] [facility];  Service: Cardiovascular;  Laterality: Bilateral;    TONSILLECTOMY     age 76   TOTAL KNEE ARTHROPLASTY Left 08/02/2015   Procedure: LEFT TOTAL KNEE ARTHROPLASTY;  Surgeon: Mcarthur Rossetti, MD;  Location: [facility] ORS;  Service: Orthopedics;  Laterality: Left;   TOTAL KNEE ARTHROPLASTY Right 12/13/2015   Procedure: RIGHT TOTAL KNEE ARTHROPLASTY;  Surgeon: Mcarthur Rossetti, MD;  Location: [facility] ORS;  Service: Orthopedics;  Laterality: Right;     Social History:   reports that she quit smoking about 3 months ago. Her smoking use included cigarettes. She has a 12.50 pack-year smoking history. Her smokeless tobacco use includes chew. She reports that she does not drink alcohol and does not use drugs.   Family History:  Her family history includes Breast cancer in her maternal aunt; Congestive Heart Failure in her brother, daughter, sister, and son; Deep vein thrombosis in her father; Dementia in her mother; Diabetes in her sister; Hypertension in her father.   Allergies Allergies  Allergen Reactions   Hydrocodone-Acetaminophen Swelling    hands   Iodine Anaphylaxis and Swelling    Iv dye   Other Other (See Comments)    ALLERGY TO METAL - BLACKENS  SKIN AND CAUSES A RASH    Tape Swelling and Other (See Comments)    Skin comes off.  Paper tape is ok tegaderm OK   Peanut Oil Other (See Comments)    ** Nuts cause runny nose   Shellfish Allergy Nausea And Vomiting and Swelling    Episode of GI infection after eating clam chowder. Still eats shrimp and other seafood     Home Medications  Prior to Admission medications   Medication Sig Start Date End Date Taking? Authorizing Provider  albuterol (PROVENTIL HFA;VENTOLIN HFA) 108 (90 Base) MCG/ACT inhaler Inhale 2 puffs into the lungs every 6 (six) hours as needed for wheezing or shortness of breath.    [provider]  alendronate (FOSAMAX) 35 MG tablet Take 35 mg by mouth every 7 (seven) days. Take with a full glass of water on an empty stomach.    [provider]  apixaban  (ELIQUIS) 5 MG TABS tablet Take 1 tablet (5 mg total) by mouth 2 (two) times daily. 07/22/18 09/23/20  Saundra Shelling, MD  Ascorbic Acid (VITAMIN C ADULT GUMMIES PO) Take by mouth. 2 gummies daily    [provider]  azelastine (ASTELIN) 0.1 % nasal spray Place 2 sprays into both nostrils 2 (two) times daily as needed for allergies. Use in each nostril as directed    [provider]  cephALEXin (KEFLEX) 500 MG capsule Take 1 capsule (500 mg total) by mouth 4 (four) times daily. 09/23/20   Rodriguez-Southworth, Sunday Spillers, PA-C  cyclobenzaprine (FLEXERIL) 10 MG tablet Take 10 mg by mouth at bedtime as needed for muscle spasms.    [provider]  Dextromethorphan-guaiFENesin (CORICIDIN HBP CONGESTION/COUGH) 10-200 MG CAPS Take 2 tablets by mouth 2 (two) times daily as needed (cough / congestion).    [provider]  dextromethorphan-guaiFENesin (MUCINEX DM) 30-600 MG 12hr tablet Take 1 tablet by mouth 2 (two) times daily as needed for cough.    [provider]  diclofenac sodium (VOLTAREN) 1 % GEL Apply 2 g topically 4 (four) times daily as needed (pain).     [provider]  diphenhydrAMINE (BENADRYL) 25 mg capsule Take 50 mg by mouth every other day.    [provider]  fluconazole (DIFLUCAN) 100 MG tablet Take 150 mg by mouth. MWF PRN    [provider]  folic acid (FOLVITE) 1 MG tablet Take 1 mg by mouth at bedtime.     [provider]  gabapentin (NEURONTIN) 300 MG capsule Take 2 capsules (600 mg total) by mouth See admin instructions. Take 600 mg by mouth twice daily, may take a third 600 mg dose as needed for pain 10/11/20 11/10/20  Molli Barrows, MD  ipratropium-albuterol (DUONEB) 0.5-2.5 (3) MG/3ML SOLN Take 3 mLs by nebulization every 6 (six) hours as needed (shortness of breath).    [provider]  levocetirizine (XYZAL) 5 MG tablet Take 5 mg by mouth at bedtime.     [provider]  montelukast  (SINGULAIR) 10 MG tablet Take 10 mg by mouth at bedtime.    [provider]  Multiple Vitamins-Minerals (MULTIVITAMIN GUMMIES ADULTS) CHEW Chew 2 tablets by mouth daily.     [provider]  nortriptyline (PAMELOR) 50 MG capsule Take 50 mg by mouth at bedtime.    [provider]  nystatin (MYCOSTATIN) 100000 UNIT/ML suspension Take 5 mLs by mouth 4 (four) times daily as needed (yeast).     [provider]  nystatin cream (MYCOSTATIN)  Apply 1 application topically 2 (two) times daily as needed (yeast).    [provider]  oxyCODONE-acetaminophen (PERCOCET) 10-325 MG tablet Take 1 tablet by mouth every 4 (four) hours as needed for pain. 11/26/20 12/26/20  Molli Barrows, MD  oxyCODONE-acetaminophen (PERCOCET) 10-325 MG tablet Take 1 tablet by mouth every 4 (four) hours as needed for pain. 12/26/20 01/25/21  Molli Barrows, MD  pantoprazole (PROTONIX) 40 MG tablet Take 40 mg by mouth 2 (two) times daily.    [provider]  Polyvinyl Alcohol-Povidone (REFRESH OP) Place 1 drop into both eyes daily as needed (dry eyes).    [provider]  predniSONE (DELTASONE) 10 MG tablet Take 35 mg by mouth daily with breakfast. On taper, currently taking 35mg  daily    [provider]  Probiotic Product (PROBIOTIC COLON SUPPORT) CAPS Take 1 capsule by mouth at bedtime.    [provider]  sacubitril-valsartan (ENTRESTO) 49-51 MG Take 0.5 tablets by mouth 2 (two) times daily.    [provider]  spironolactone (ALDACTONE) 25 MG tablet Take 12.5 mg by mouth daily. 09/18/20   [provider]  sucralfate (CARAFATE) 1 G tablet Take 1 g by mouth 2 (two) times daily before a meal.    [provider]  Tocilizumab (ACTEMRA) 162 MG/0.9ML SOSY Inject into the skin.    [provider]  torsemide (DEMADEX) 20 MG tablet Take 2 tablets (40 mg) by mouth once daily 04/12/20   Deboraha Sprang, MD  umeclidinium-vilanterol  [facility] ELLIPTA) 62.5-25 MCG/INH AEPB Inhale 1 puff into the lungs daily. 12/23/17   [provider]  Vitamin D, Ergocalciferol, (DRISDOL) 50000 UNITS CAPS capsule Take 50,000 Units by mouth every Thursday.     [provider]     Critical care time: 50 minutes    Freda [place], MD [place] Office: [phone]   See Amion for personal pager PCCM on call pager [phone] until 7pm. Please call [place] 7p-7a. [phone]

## 2020-11-07 NOTE — ED Notes (Addendum)
Lab reports lactic 2.7; Dr Archie Balboa notified and acknowledged

## 2020-11-08 ENCOUNTER — Inpatient Hospital Stay: Payer: Medicare Other

## 2020-11-08 DIAGNOSIS — A419 Sepsis, unspecified organism: Secondary | ICD-10-CM | POA: Diagnosis not present

## 2020-11-08 DIAGNOSIS — R6521 Severe sepsis with septic shock: Secondary | ICD-10-CM | POA: Diagnosis not present

## 2020-11-08 DIAGNOSIS — N179 Acute kidney failure, unspecified: Secondary | ICD-10-CM | POA: Diagnosis not present

## 2020-11-08 LAB — URINE CULTURE: Culture: NO GROWTH

## 2020-11-08 LAB — RESPIRATORY PANEL BY PCR

## 2020-11-08 LAB — HIV ANTIBODY (ROUTINE TESTING W REFLEX): HIV Screen 4th Generation wRfx: NONREACTIVE

## 2020-11-08 LAB — LIPASE, BLOOD: Lipase: 44 U/L (ref 11–51)

## 2020-11-08 LAB — CBC
HCT: 27.9 % — ABNORMAL LOW (ref 36.0–46.0)
Hemoglobin: 8.9 g/dL — ABNORMAL LOW (ref 12.0–15.0)
MCH: 28.3 pg (ref 26.0–34.0)
MCHC: 31.9 g/dL (ref 30.0–36.0)
MCV: 88.9 fL (ref 80.0–100.0)
Platelets: 282 10*3/uL (ref 150–400)
RBC: 3.14 MIL/uL — ABNORMAL LOW (ref 3.87–5.11)
RDW: 16.8 % — ABNORMAL HIGH (ref 11.5–15.5)
WBC: 10.4 10*3/uL (ref 4.0–10.5)
nRBC: 0 % (ref 0.0–0.2)

## 2020-11-08 LAB — GASTROINTESTINAL PANEL BY PCR, STOOL (REPLACES STOOL CULTURE)

## 2020-11-08 LAB — LACTIC ACID, PLASMA
Lactic Acid, Venous: 3.6 mmol/L (ref 0.5–1.9)
Lactic Acid, Venous: 1.4 mmol/L (ref 0.5–1.9)
Lactic Acid, Venous: 1.7 mmol/L (ref 0.5–1.9)
Lactic Acid, Venous: 1.6 mmol/L (ref 0.5–1.9)

## 2020-11-08 LAB — BASIC METABOLIC PANEL
Anion gap: 8 (ref 5–15)
BUN: 29 mg/dL — ABNORMAL HIGH (ref 8–23)
CO2: 29 mmol/L (ref 22–32)
Calcium: 8.3 mg/dL — ABNORMAL LOW (ref 8.9–10.3)
Chloride: 100 mmol/L (ref 98–111)
Creatinine, Ser: 1.39 mg/dL — ABNORMAL HIGH (ref 0.44–1.00)
GFR, Estimated: 41 mL/min — ABNORMAL LOW (ref >60)
Glucose, Bld: 110 mg/dL — ABNORMAL HIGH (ref 70–99)
Potassium: 3.7 mmol/L (ref 3.5–5.1)
Sodium: 137 mmol/L (ref 135–145)

## 2020-11-08 LAB — MAGNESIUM: Magnesium: 2 mg/dL (ref 1.7–2.4)

## 2020-11-08 LAB — URINALYSIS, COMPLETE (UACMP) WITH MICROSCOPIC
Bacteria, UA: NONE SEEN
Bilirubin Urine: NEGATIVE
Glucose, UA: NEGATIVE mg/dL
Hgb urine dipstick: NEGATIVE
Ketones, ur: NEGATIVE mg/dL
Leukocytes,Ua: NEGATIVE
Nitrite: NEGATIVE
Protein, ur: NEGATIVE mg/dL
Specific Gravity, Urine: 1.008 (ref 1.005–1.030)
pH: 5 (ref 5.0–8.0)

## 2020-11-08 LAB — PROTIME-INR
INR: 1.3 — ABNORMAL HIGH (ref 0.8–1.2)
Prothrombin Time: 16 seconds — ABNORMAL HIGH (ref 11.4–15.2)

## 2020-11-08 LAB — BRAIN NATRIURETIC PEPTIDE: B Natriuretic Peptide: 25.5 pg/mL (ref 0.0–100.0)

## 2020-11-08 LAB — TYPE AND SCREEN
ABO/RH(D): O POS
Antibody Screen: NEGATIVE

## 2020-11-08 LAB — CORTISOL: Cortisol, Plasma: 2.8 ug/dL

## 2020-11-08 LAB — C DIFFICILE QUICK SCREEN W PCR REFLEX
C Diff antigen: NEGATIVE
C Diff interpretation: NOT DETECTED
C Diff toxin: NEGATIVE

## 2020-11-08 LAB — RESP PANEL BY RT-PCR (FLU A&B, COVID) ARPGX2
Influenza A by PCR: NEGATIVE
Influenza B by PCR: NEGATIVE
SARS Coronavirus 2 by RT PCR: POSITIVE — AB

## 2020-11-08 LAB — TSH: TSH: 3.439 u[IU]/mL (ref 0.350–4.500)

## 2020-11-08 LAB — GLUCOSE, CAPILLARY: Glucose-Capillary: 114 mg/dL — ABNORMAL HIGH (ref 70–99)

## 2020-11-08 LAB — APTT: aPTT: 34 seconds (ref 24–36)

## 2020-11-08 LAB — PROCALCITONIN: Procalcitonin: 0.16 ng/mL

## 2020-11-08 LAB — MRSA NEXT GEN BY PCR, NASAL: MRSA by PCR Next Gen: NOT DETECTED

## 2020-11-08 LAB — PHOSPHORUS: Phosphorus: 3.9 mg/dL (ref 2.5–4.6)

## 2020-11-08 MED ORDER — COSYNTROPIN 0.25 MG IJ SOLR
0.2500 mg | Freq: Once | INTRAMUSCULAR | Status: DC
  Filled 2020-11-08: qty 0.25

## 2020-11-08 MED ORDER — ARFORMOTEROL TARTRATE 15 MCG/2ML IN NEBU
15.0000 ug | INHALATION_SOLUTION | Freq: Two times a day (BID) | RESPIRATORY_TRACT | Status: DC
  Administered 2020-11-08: 15 ug via RESPIRATORY_TRACT
  Filled 2020-11-08 (×3): qty 2

## 2020-11-08 MED ORDER — MONTELUKAST SODIUM 10 MG PO TABS
10.0000 mg | ORAL_TABLET | Freq: Every day | ORAL | Status: DC
  Administered 2020-11-08 – 2020-11-16 (×9): 10 mg via ORAL
  Filled 2020-11-08 (×9): qty 1

## 2020-11-08 MED ORDER — PREDNISONE 20 MG PO TABS
20.0000 mg | ORAL_TABLET | Freq: Every day | ORAL | Status: DC
  Administered 2020-11-08 – 2020-11-17 (×10): 20 mg via ORAL
  Filled 2020-11-08 (×10): qty 1

## 2020-11-08 MED ORDER — LACTATED RINGERS IV BOLUS
500.0000 mL | Freq: Once | INTRAVENOUS | Status: AC
  Administered 2020-11-08: 500 mL via INTRAVENOUS

## 2020-11-08 MED ORDER — GABAPENTIN 600 MG PO TABS
600.0000 mg | ORAL_TABLET | Freq: Three times a day (TID) | ORAL | Status: DC
  Administered 2020-11-08 – 2020-11-17 (×28): 600 mg via ORAL
  Filled 2020-11-08 (×31): qty 1

## 2020-11-08 MED ORDER — REVEFENACIN 175 MCG/3ML IN SOLN
175.0000 ug | Freq: Every day | RESPIRATORY_TRACT | Status: DC
  Administered 2020-11-08: 175 ug via RESPIRATORY_TRACT
  Filled 2020-11-08: qty 3

## 2020-11-08 MED ORDER — ACETAMINOPHEN 325 MG PO TABS
650.0000 mg | ORAL_TABLET | Freq: Four times a day (QID) | ORAL | Status: DC | PRN
  Administered 2020-11-08 – 2020-11-15 (×10): 650 mg via ORAL
  Filled 2020-11-08 (×10): qty 2

## 2020-11-08 MED ORDER — PANTOPRAZOLE SODIUM 40 MG PO TBEC
40.0000 mg | DELAYED_RELEASE_TABLET | Freq: Two times a day (BID) | ORAL | Status: DC
  Administered 2020-11-08 – 2020-11-17 (×19): 40 mg via ORAL
  Filled 2020-11-08 (×19): qty 1

## 2020-11-08 MED ORDER — FOLIC ACID 1 MG PO TABS
1.0000 mg | ORAL_TABLET | Freq: Every day | ORAL | Status: DC
  Administered 2020-11-08 – 2020-11-17 (×10): 1 mg via ORAL
  Filled 2020-11-08 (×10): qty 1

## 2020-11-08 MED ORDER — PIPERACILLIN-TAZOBACTAM 3.375 G IVPB
3.3750 g | Freq: Three times a day (TID) | INTRAVENOUS | Status: DC
  Administered 2020-11-08 – 2020-11-12 (×12): 3.375 g via INTRAVENOUS
  Filled 2020-11-08 (×14): qty 50

## 2020-11-08 MED ORDER — ALBUTEROL SULFATE HFA 108 (90 BASE) MCG/ACT IN AERS
2.0000 | INHALATION_SPRAY | Freq: Four times a day (QID) | RESPIRATORY_TRACT | Status: DC | PRN
  Filled 2020-11-08: qty 6.7

## 2020-11-08 MED ORDER — CHLORHEXIDINE GLUCONATE CLOTH 2 % EX PADS
6.0000 | MEDICATED_PAD | Freq: Every day | CUTANEOUS | Status: DC
  Administered 2020-11-08 – 2020-11-17 (×9): 6 via TOPICAL

## 2020-11-08 MED ORDER — HEPARIN (PORCINE) 25000 UT/250ML-% IV SOLN
1400.0000 [IU]/h | INTRAVENOUS | Status: DC
  Administered 2020-11-08: 1400 [IU]/h via INTRAVENOUS
  Filled 2020-11-08: qty 250

## 2020-11-08 MED ORDER — UMECLIDINIUM-VILANTEROL 62.5-25 MCG/ACT IN AEPB
1.0000 | INHALATION_SPRAY | Freq: Every day | RESPIRATORY_TRACT | Status: DC
  Administered 2020-11-09 – 2020-11-17 (×9): 1 via RESPIRATORY_TRACT
  Filled 2020-11-08 (×2): qty 14

## 2020-11-08 MED ORDER — OXYCODONE HCL 5 MG PO TABS
2.5000 mg | ORAL_TABLET | ORAL | Status: DC | PRN
  Administered 2020-11-08 – 2020-11-09 (×3): 5 mg via ORAL
  Filled 2020-11-08 (×3): qty 1

## 2020-11-08 NOTE — Consult Note (Addendum)
Wendy Sanchez for IV Heparin Indication: atrial fibrillation  Patient Measurements: Height: 5\' 7"  (170.2 cm) Weight: (!) 143.2 kg (315 lb 11.2 oz) IBW/kg (Calculated) : 61.6 Heparin Dosing Weight: 96.9 kg  Vital Signs: Temp: 100.1 F (37.8 C) (10/28 0800) Temp Source: Oral (10/28 0800) BP: 109/74 (10/28 0900) Pulse Rate: 124 (10/28 0900)  Labs: Recent Labs    11/07/20 1905 11/08/20 0209 11/08/20 0221  HGB 8.8* 8.9*  --   HCT 28.1* 27.9*  --   PLT 289 282  --   APTT  --   --  34  LABPROT  --   --  16.0*  INR  --   --  1.3*  CREATININE 1.26* 1.39*  --     Estimated Creatinine Clearance: 56.8 mL/min (A) (by C-G formula based on SCr of 1.39 mg/dL (H)).   Medical History: Past Medical History:  Diagnosis Date   Allergic rhinitis    Arthritis    Asthma    problems with fumes and aerosols cause asthma   Chest pain 10/19/2016   CHF (congestive heart failure) (HCC)    Complication of anesthesia    awakens during surgery; has occurred with last 3-4 surgeries    COPD (chronic obstructive pulmonary disease) ([place])    DVT (deep venous thrombosis) ([place]) 07/13/2018   Right leg   Environmental allergies    fumes    Fibromyalgia    GERD (gastroesophageal reflux disease)    Headache    Hemorrhoids    History of bronchitis    Hx of total knee arthroplasty 12/13/2015   Hyperlipidemia    Hypertension    Morbid obesity with BMI of 45.0-49.9, adult ([place]) 07/25/2015   NICM (nonischemic cardiomyopathy) ([place])    a. ? PVC mediated;  b. 06/2016 Echo: EF 25-30%, mild LVH, mild LAE, mild AI/MR/TR/PR; c. 08/2016 Cath: nl cors, EF 25%.   Numbness    hands bilat when driving; improves when not preforming task    Osteoarthritis of left knee 08/02/2015   PE (pulmonary thromboembolism) ([place]) 07/13/2018   Pes anserinus bursitis of left knee 05/18/2016   Presence of right artificial knee joint 05/18/2016   PVC (premature ventricular contraction) 06/04/2017   PVC's  (premature ventricular contractions)    a. 11/2016 [place] started;  b. 11/29/16 48h Holter: 97989 PVC's (41%); c. 12/2016 24h Holter: 18040 PVC's (15%); c. 02/2017 48h Holter: 37262 PVC's (41%).   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   Temporal arteritis ([place]) 05/01/2020   Tinnitus    comes and goes    Unilateral primary osteoarthritis, right knee 12/13/2015   Vertigo    none for over 2 yrs    Medications:  Apixaban 5 mg BID (last dose 10/28 0911)  Assessment: Patient is a 70 y/o F with medical history as above including DVT / PE on apixaban who is admitted with septic shock. Patient is currently admitted to the ICU. Pharmacy consulted to transition apixaban to IV heparin at this time.   Baseline CBC notable for anemia with Hgb 8.9 which appears consistent with baseline. Baseline aPTT 34s, INR 1.3. Given last dose of apixaban was 10/28 AM, will presume interference on anti-Xa level.   Goal of Therapy:  Heparin level 0.3-0.7 units/ml aPTT 66 - 102 seconds Monitor platelets by anticoagulation protocol: Yes   Plan:  --  Will start heparin at 1400 units/hr at 2200 tonight, no bolus --aPTT 6 hours after initiation of infusion. Will also check a HL. Plan to follow aPTT until correlation with heparin level is established and then switch over to heparin level monitoring --Daily CBC per protocol while on IV heparin  Wendy Sanchez 11/08/2020,10:00 AM

## 2020-11-08 NOTE — Progress Notes (Addendum)
PHARMACY CONSULT NOTE - FOLLOW UP  Pharmacy Consult for Electrolyte Monitoring and Replacement   Recent Labs: Potassium (mmol/L)  Date Value  11/07/2020 3.4 (L)   Magnesium (mg/dL)  Date Value  10/19/2016 2.1   Calcium (mg/dL)  Date Value  11/07/2020 7.8 (L)   Albumin (g/dL)  Date Value  11/07/2020 2.3 (L)  02/25/2017 3.7   Sodium (mmol/L)  Date Value  11/07/2020 139  04/11/2020 140     Assessment: 10/27 @ 1905:  K  = 3.4                           Ca = 7.8, Alb = 2.3                           Corrected Ca = 9.16  Goal of Therapy:  Electrolytes WNL   Plan:  KCl 40 mEq PO BID ordered to start on 10/28 @ 0000. Will recheck electrolytes on 10/28 with AM Labs.  Orene Desanctis ,PharmD Clinical Pharmacist 11/08/2020 12:07 AM

## 2020-11-08 NOTE — Progress Notes (Signed)
Patient alert and oriented x4, in NSR/ST, room air. Bps stable with MAPS > 65.  Dressing change to wound during shift. Ostomy bag changed. PRN medications given for fever and pain per orders.

## 2020-11-08 NOTE — Progress Notes (Signed)
[facility] Progress Note Patient Name: Wendy Sanchez DOB: [birth year] MRN: [id number]   Date of Service  11/08/2020  HPI/Events of Note  Patient admitted to the ICU with suspected septic shock possibly due to UTI vs colitis, work up is in progress, cellulitis around her ostomy site is also a recent diagnosis which is relevant, patient has AKI, and a history of cardiomyopathy.  eICU Interventions  New Patient Evaluation.        Kyran Whittier U Ellyse Rotolo 11/08/2020, 2:00 AM

## 2020-11-08 NOTE — Consult Note (Signed)
[place] Nurse ostomy consult note Stoma type/location: LLQ colostomy. Patient had surgery on 9/15/223 at [facility]. Is currently residing in a Rehab facility. Stomal assessment/size: Located very low on the patient's abdomen. Patient is not able to visualize the stoma for self care. Pink, moist with a small granuloma located at 7 o'clock. Lumen in center, patent. Peristomal assessment: intact, clear Treatment options for stomal/peristomal skin: skin barrier ring Output : Tan to brown soft stool Ostomy pouching: 2pc. 2 and 1/4 inch pouching system with skin barrier ring. Pouch is Kellie Simmering # [id number], skin barrier is Kellie Simmering # [phone] and skin barrier ring is Kellie Simmering # [id number]. Education provided: None today. Patient is in ICU. Patient reports that her son has been through the instruction at [facility] that a little at a time he has been talking to her about it.  She cannot see the stoma. Enrolled patient in [place] program: No   WOC Nurse Consult Note: Reason for Consult:Midline incision healing by secondary intention Wound type:surgical Pressure Injury POA: N/A Measurement:13cm x 3.5cm x 2cm Wound WER:XVQM, smooth, nongranulating. Drainage (amount, consistency, odor) small serous Periwound:intact, dry Dressing procedure/placement/frequency: NS continuously moist gauze dressings are indicated. Orders provided for Nursing. Change twice daily and PRN for drainage strike-through onto exterior of dressing or for dressing dislodgement.   [place] nursing team will not follow, but will remain available to this patient, the nursing and medical teams.  Please re-consult if needed. Thanks, Maudie Flakes, MSN, RN, [place], Arther Abbott  Pager# [phone]

## 2020-11-08 NOTE — Progress Notes (Signed)
NAME:  Wendy Sanchez, MRN:  [id number], DOB:  1950/03/04, LOS: 1 ADMISSION DATE:  11/07/2020, CONSULTATION DATE:  11/07/2020 REFERRING MD:  Archie Balboa, CHIEF COMPLAINT:  Weakness   History of Present Illness:  Wendy Sanchez is a 70 year old woman, former smoker with COPD, nonischemic cardiomyopathy EF 50%, peripheral vascular disease, DVT/PE on Eliquis with recent perforated sigmoid diverticulitis status post open sigmoidectomy and colostomy placement at [facility] on 09/26/2020 who comes to [facility] ED with weakness and low blood pressure from her rehab facility.   She reports that she has felt weak today more so in her lower extremities.  She feels this way when her blood pressure is low which she has noted recently as the cardiology team is titrating her heart failure regimen.  She also reports discomfort lower abdomen/pelvic region and discomfort with urination.  She reports having increased ostomy output with liquid stools and also more foul-smelling than normal.  She was recently started on cefdinir for concern of soft tissue infection around her ostomy site.  She reports being on many different types of antibiotics prior to her admission in September and since then.   She denies any chest pain or shortness of breath.  She denies any nausea or vomiting.  She does feel feverish and had chills earlier today.  She is reported to have fever of 104.4 at the facility earlier today.   She has been afebrile since arrival in the ER.  Her blood pressure maps have been in the 50s despite 2 L of crystalloid fluid resuscitation and she was started on peripheral Levophed.  She was given cefepime and vancomycin for empiric coverage.  Her creatinine is elevated at 1.26 with a baseline of 0.74-1.  Lactic acid is 2.7.   EKG showed sinus tachycardia and no ST or T wave abnormalities. Chest radiograph is clear.   PCCM has been consulted for admission for concern of septic shock.  Pertinent  Medical History   Perforating sigmoid diverticulitis s/p ostomy creation HFrEF (LVEF 20%) COPD PVD  Significant Hospital Events: Including procedures, antibiotic start and stop dates in addition to other pertinent events   10/27: admitted to ICU  Interim History / Subjective:  Feeling improved this morning. Abdominal discomfort and distention lessened. Tachycardic. Off pressors.  Objective   Blood pressure 109/74, pulse (!) 124, temperature 100.1 F (37.8 C), temperature source Oral, resp. rate 17, height 5\' 7"  (1.702 m), weight (!) 143.2 kg, SpO2 96 %.        Intake/Output Summary (Last 24 hours) at 11/08/2020 1154 Last data filed at 11/08/2020 0915 Gross per 24 hour  Intake 3764.3 ml  Output 1150 ml  Net 2614.3 ml   Filed Weights   11/07/20 1938 11/08/20 0152  Weight: (!) 142.9 kg (!) 143.2 kg    Examination: General: awake, alert, conversant HENT: moist OMM, pupils equal and reactive Lungs: CTA b/l, no W/C/R Cardiovascular: tachycardic, no M/R/G Abdomen: midline incision, ostomy in LLQ with liquid stool Extremities: 1+ pitting edema of BLE Neuro: A/Ox3 GU: deferred  Resolved Hospital Problem list   N/A  Assessment & Plan:  Septic shock in immunosuppressed patient Possible urinary tract infection vs colitis vs soft tissue infection around ostomy site -Wean pressors -Check limited echo given h/o HFrEF -Has received adequate fluid resuscitation at this time.  We will proceed with fluid boluses as needed. -Transition to Zosyn -F/u culture data -Low threshold for repeat CT a/p; will hold off for now given clinical improvement and AKI  Acute kidney injury Hypokalemia Likely prerenal etiology -Follow-up urinalysis -Check renal ultrasound -Instructed nurse to perform straight cath or place Foley catheter as patient has not voided yet in order to obtain urine samples -Replete potassium   Lactic acidosis In setting of septic shock and acute kidney injury -Fluid resuscitation  as above -Trend lactate   Congestive heart failure, not in acute exacerbation -Hold Entresto and spironolactone -Caution with overaggressive volume resuscitation -TTE as above   Surgical abdominal wound Colostomy -Consult to wound care/colostomy nurse for further recommendations and management  Temporal arteritis on chronic steroids -Continue prednisone 20 mg daily; no need for stress dose steroids at this point -Continue home PPI -Not on Bactrim ppx   COPD, not in acute exacerbation -Brovana/Yupelri   DVT/PE -Heparin gtt while in ICU awaiting clinical improvement -Can transition back to Eliquis once stabilized  Best Practice (right click and "Reselect all SmartList Selections" daily)   Diet/type: Regular consistency (see orders) DVT prophylaxis: systemic heparin GI prophylaxis: PPI Lines: N/A Foley:  N/A Code Status:  full code Last date of multidisciplinary goals of care discussion [n/a]  Labs   CBC: Recent Labs  Lab 11/07/20 1905 11/08/20 0209  WBC 10.7* 10.4  NEUTROABS 9.1*  --   HGB 8.8* 8.9*  HCT 28.1* 27.9*  MCV 88.4 88.9  PLT 289 751    Basic Metabolic Panel: Recent Labs  Lab 11/07/20 1905 11/08/20 0209  NA 139 137  K 3.4* 3.7  CL 102 100  CO2 28 29  GLUCOSE 120* 110*  BUN 31* 29*  CREATININE 1.26* 1.39*  CALCIUM 7.8* 8.3*  MG  --  2.0  PHOS  --  3.9   GFR: Estimated Creatinine Clearance: 56.8 mL/min (A) (by C-G formula based on SCr of 1.39 mg/dL (H)). Recent Labs  Lab 11/07/20 1905 11/07/20 1912 11/07/20 2113 11/08/20 0209 11/08/20 1046  PROCALCITON  --   --   --  0.16  --   WBC 10.7*  --   --  10.4  --   LATICACIDVEN  --  2.7* 2.7*  --  3.6*    Liver Function Tests: Recent Labs  Lab 11/07/20 1905  AST 18  ALT 13  ALKPHOS 54  BILITOT 0.5  PROT 5.7*  ALBUMIN 2.3*   Recent Labs  Lab 11/08/20 0209  LIPASE 44   No results for input(s): AMMONIA in the last 168 hours.  ABG No results found for: PHART, PCO2ART, PO2ART,  HCO3, TCO2, ACIDBASEDEF, O2SAT   Coagulation Profile: Recent Labs  Lab 11/08/20 0221  INR 1.3*    Cardiac Enzymes: No results for input(s): CKTOTAL, CKMB, CKMBINDEX, TROPONINI in the last 168 hours.  HbA1C: No results found for: HGBA1C  CBG: Recent Labs  Lab 11/07/20 1905 11/08/20 0142  GLUCAP 129* 114*    Review of Systems:   All systems reviewed. Pertinent findings noted in HPI.  Past Medical History:  She,  has a past medical history of Allergic rhinitis, Arthritis, Asthma, Chest pain (10/19/2016), CHF (congestive heart failure) ([place]), Complication of anesthesia, COPD (chronic obstructive pulmonary disease) ([place]), DVT (deep venous thrombosis) ([place]) (07/13/2018), Environmental allergies, Fibromyalgia, GERD (gastroesophageal reflux disease), Headache, Hemorrhoids, History of bronchitis, total knee arthroplasty (12/13/2015), Hyperlipidemia, Hypertension, Morbid obesity with BMI of 45.0-49.9, adult ([place]) (07/25/2015), NICM (nonischemic cardiomyopathy) ([place]), Numbness, Osteoarthritis of left knee (08/02/2015), PE (pulmonary thromboembolism) ([place]) (07/13/2018), Pes anserinus bursitis of left knee (05/18/2016), Presence of right artificial knee joint (05/18/2016), PVC (premature ventricular contraction) (06/04/2017), PVC's (premature ventricular contractions), Sleep  apnea, Spinal stenosis of lumbar region (06/19/2015), Status post gastric banding, Status post total left knee replacement (08/02/2015), Temporal arteritis ([place]) (05/01/2020), Tinnitus, Unilateral primary osteoarthritis, right knee (12/13/2015), and Vertigo.   Surgical History:   Past Surgical History:  Procedure Laterality Date   ABDOMINAL HYSTERECTOMY  1979   left ovary remains   ABDOMINAL HYSTERECTOMY     ARTERY BIOPSY Left 06/07/2019   Procedure: BIOPSY TEMPORAL ARTERY;  Surgeon: Algernon Huxley, MD;  Location: [facility] ORS;  Service: Vascular;  Laterality: Left;   CHOLECYSTECTOMY     COLONOSCOPY     COLONOSCOPY N/A 10/30/2019    Procedure: COLONOSCOPY;  Surgeon: Lesly Rubenstein, MD;  Location: [facility] ENDOSCOPY;  Service: Endoscopy;  Laterality: N/A;   ESOPHAGOGASTRODUODENOSCOPY N/A 10/30/2019   Procedure: ESOPHAGOGASTRODUODENOSCOPY (EGD);  Surgeon: Lesly Rubenstein, MD;  Location: [facility] ENDOSCOPY;  Service: Endoscopy;  Laterality: N/A;   fibrous tissue removed from right shoulder and back of neck      2 years ago    [place] N/A 02/28/2019   Procedure: SALIVARY GLAND BIOPSY;  Surgeon: Carloyn Manner, MD;  Location: [facility] ORS;  Service: ENT;  Laterality: N/A;   FRACTURE SURGERY     left small finger   GANGLION CYST EXCISION     GASTRIC BYPASS  1980   states had bypass with banding and band left in -   GASTRIC BYPASS OPEN     JOINT REPLACEMENT Bilateral 2017   knees   KNEE SURGERY Bilateral    both knees  2001   NECK SURGERY N/A 1205/19   ganglion cyst removal, benigh    PULMONARY THROMBECTOMY Bilateral 07/14/2018   Procedure: PULMONARY THROMBECTOMY;  Surgeon: Algernon Huxley, MD;  Location: [place] CV LAB;  Service: Cardiovascular;  Laterality: Bilateral;   PVC ABLATION N/A 06/04/2017   Procedure: PVC ABLATION;  Surgeon: Evans Lance, MD;  Location: [place] [facility] LAB;  Service: Cardiovascular;  Laterality: N/A;   RIGHT/LEFT HEART CATH AND CORONARY ANGIOGRAPHY Bilateral 08/12/2016   Procedure: Right/Left Heart Cath and Coronary Angiography;  Surgeon: Yolonda Kida, MD;  Location: [place] [facility];  Service: Cardiovascular;  Laterality: Bilateral;   TONSILLECTOMY     age 32   TOTAL KNEE ARTHROPLASTY Left 08/02/2015   Procedure: LEFT TOTAL KNEE ARTHROPLASTY;  Surgeon: Mcarthur Rossetti, MD;  Location: [facility] ORS;  Service: Orthopedics;  Laterality: Left;   TOTAL KNEE ARTHROPLASTY Right 12/13/2015   Procedure: RIGHT TOTAL KNEE ARTHROPLASTY;  Surgeon: Mcarthur Rossetti, MD;  Location: [facility] ORS;  Service: Orthopedics;  Laterality: Right;     Social History:   reports that she quit  smoking about 3 months ago. Her smoking use included cigarettes. She has a 12.50 pack-year smoking history. Her smokeless tobacco use includes chew. She reports that she does not drink alcohol and does not use drugs.   Family History:  Her family history includes Breast cancer in her maternal aunt; Congestive Heart Failure in her brother, daughter, sister, and son; Deep vein thrombosis in her father; Dementia in her mother; Diabetes in her sister; Hypertension in her father.   Allergies Allergies  Allergen Reactions   Hydrocodone-Acetaminophen Swelling    hands   Iodine Anaphylaxis and Swelling    Iv dye   Other Other (See Comments)    ALLERGY TO METAL - BLACKENS SKIN AND CAUSES A RASH    Tape Swelling and Other (See Comments)    Skin comes off.  Paper tape is ok tegaderm  OK   Peanut Oil Other (See Comments)    ** Nuts cause runny nose   Shellfish Allergy Nausea And Vomiting and Swelling    Episode of GI infection after eating clam chowder. Still eats shrimp and other seafood     Home Medications  Prior to Admission medications   Medication Sig Start Date End Date Taking? Authorizing Provider  acidophilus (RISAQUAD) CAPS capsule Take 1 capsule by mouth in the morning.   Yes [provider]  albuterol (PROVENTIL HFA;VENTOLIN HFA) 108 (90 Base) MCG/ACT inhaler Inhale 2 puffs into the lungs every 6 (six) hours as needed for wheezing or shortness of breath.   Yes [provider]  alendronate (FOSAMAX) 35 MG tablet Take 35 mg by mouth every Saturday. Take with a full glass of water on an empty stomach.   Yes [provider]  apixaban (ELIQUIS) 5 MG TABS tablet Take 1 tablet (5 mg total) by mouth 2 (two) times daily. 07/22/18 11/08/20 Yes Pyreddy, Reatha Harps, MD  ascorbic acid (VITAMIN C) 500 MG tablet Take 500 mg by mouth 2 (two) times daily.   Yes [provider]  azelastine (ASTELIN) 0.1 % nasal spray Place 1 spray into both nostrils 2 (two) times daily.   Yes  [provider]  calcium carbonate (OS-CAL - DOSED IN MG OF ELEMENTAL CALCIUM) 1250 (500 Ca) MG tablet Take 2 tablets by mouth in the morning.   Yes [provider]  cephALEXin (KEFLEX) 500 MG capsule Take 500 mg by mouth 2 (two) times daily. 11/05/20 11/11/20 Yes [provider]  diclofenac sodium (VOLTAREN) 1 % GEL Apply 2 g topically in the morning. (Apply to most painful area)   Yes [provider]  diphenhydrAMINE (BENADRYL) 25 mg capsule Take 25 mg by mouth every 6 (six) hours as needed for itching or allergies.   Yes [provider]  docusate sodium (COLACE) 100 MG capsule Take 200 mg by mouth at bedtime as needed for mild constipation.   Yes [provider]  folic acid (FOLVITE) 1 MG tablet Take 1 mg by mouth daily.   Yes [provider]  gabapentin (NEURONTIN) 600 MG tablet Take 600 mg by mouth 3 (three) times daily.   Yes [provider]  ipratropium-albuterol (DUONEB) 0.5-2.5 (3) MG/3ML SOLN Take 3 mLs by nebulization every 6 (six) hours as needed (shortness of breath).   Yes [provider]  levocetirizine (XYZAL) 5 MG tablet Take 5 mg by mouth every evening.   Yes [provider]  magnesium oxide (MAG-OX) 400 MG tablet Take 800 mg by mouth daily.   Yes [provider]  montelukast (SINGULAIR) 10 MG tablet Take 10 mg by mouth at bedtime.   Yes [provider]  Multiple Vitamins-Minerals (MULTIVITAMIN WITH MINERALS) tablet Take 1 tablet by mouth daily.   Yes [provider]  nortriptyline (PAMELOR) 50 MG capsule Take 50 mg by mouth at bedtime.   Yes [provider]  nystatin (MYCOSTATIN) 100000 UNIT/ML suspension Take 2 mLs by mouth 4 (four) times daily. 10/28/20 11/10/20 Yes [provider]  nystatin cream (MYCOSTATIN) Apply 1 application topically 2 (two) times daily. (Apply under skin folds)   Yes [provider]  oxyCODONE-acetaminophen  (PERCOCET) 10-325 MG tablet Take 1 tablet by mouth every 4 (four) hours as needed for pain.   Yes [provider]  pantoprazole (PROTONIX) 40 MG tablet Take 40 mg by mouth 2 (two) times daily.   Yes [provider]  predniSONE (DELTASONE) 10 MG tablet Take 20 mg by mouth in the morning.   Yes [provider]  sacubitril-valsartan (ENTRESTO) 24-26 MG Take 1 tablet by mouth 2 (two) times daily.   Yes [provider]  spironolactone (ALDACTONE) 25 MG tablet Take 12.5 mg by mouth daily. 09/18/20  Yes [provider]  sucralfate (CARAFATE) 1 G tablet Take 1 g by mouth 2 (two) times daily before a meal.   Yes [provider]  torsemide (DEMADEX) 20 MG tablet Take 2 tablets (40 mg) by mouth once daily Patient taking differently: Take 40 mg by mouth 2 (two) times daily. 04/12/20  Yes Deboraha Sprang, MD  umeclidinium-vilanterol (ANORO ELLIPTA) 62.5-25 MCG/INH AEPB Inhale 1 puff into the lungs daily. 12/23/17  Yes [provider]  Vitamin D, Ergocalciferol, (DRISDOL) 50000 UNITS CAPS capsule Take 50,000 Units by mouth every Monday.   Yes [provider]  WHEAT DEXTRIN PO Take 1 packet by mouth in the morning.   Yes [provider]  Wound Cleansers (VASHE WOUND THERAPY) SOLN Apply 1 application topically daily. (Irrigate abdominal wound)   Yes [provider]  zinc sulfate 220 (50 Zn) MG capsule Take 220 mg by mouth daily. 10/28/20 11/11/20 Yes [provider]  gabapentin (NEURONTIN) 300 MG capsule Take 2 capsules (600 mg total) by mouth See admin instructions. Take 600 mg by mouth twice daily, may take a third 600 mg dose as needed for pain Patient not taking: No sig reported 10/11/20 11/10/20  Molli Barrows, MD  oxyCODONE-acetaminophen (PERCOCET) 10-325 MG tablet Take 1 tablet by mouth every 4 (four) hours as needed for pain. 11/26/20 12/26/20  Molli Barrows, MD  oxyCODONE-acetaminophen (PERCOCET) 10-325 MG tablet  Take 1 tablet by mouth every 4 (four) hours as needed for pain. 12/26/20 01/25/21  Molli Barrows, MD     Critical care time: 36 minutes

## 2020-11-09 ENCOUNTER — Inpatient Hospital Stay
Admit: 2020-11-09 | Discharge: 2020-11-09 | Disposition: A | Discharge status: Home / Self Care | Payer: Medicare Other | Attending: Internal Medicine | Admitting: Internal Medicine

## 2020-11-09 DIAGNOSIS — Z933 Colostomy status: Secondary | ICD-10-CM | POA: Diagnosis not present

## 2020-11-09 DIAGNOSIS — A419 Sepsis, unspecified organism: Secondary | ICD-10-CM | POA: Diagnosis not present

## 2020-11-09 DIAGNOSIS — N179 Acute kidney failure, unspecified: Secondary | ICD-10-CM | POA: Diagnosis not present

## 2020-11-09 DIAGNOSIS — Z6841 Body Mass Index (BMI) 40.0 and over, adult: Secondary | ICD-10-CM

## 2020-11-09 LAB — CBC
HCT: 27.1 % — ABNORMAL LOW (ref 36.0–46.0)
Hemoglobin: 8.4 g/dL — ABNORMAL LOW (ref 12.0–15.0)
MCH: 27.2 pg (ref 26.0–34.0)
MCHC: 31 g/dL (ref 30.0–36.0)
MCV: 87.7 fL (ref 80.0–100.0)
Platelets: 284 10*3/uL (ref 150–400)
RBC: 3.09 MIL/uL — ABNORMAL LOW (ref 3.87–5.11)
RDW: 16.9 % — ABNORMAL HIGH (ref 11.5–15.5)
WBC: 5.8 10*3/uL (ref 4.0–10.5)
nRBC: 0 % (ref 0.0–0.2)

## 2020-11-09 LAB — HEPARIN LEVEL (UNFRACTIONATED): Heparin Unfractionated: >1.1 IU/mL — ABNORMAL HIGH (ref 0.30–0.70)

## 2020-11-09 LAB — BASIC METABOLIC PANEL
Anion gap: 7 (ref 5–15)
BUN: 21 mg/dL (ref 8–23)
CO2: 29 mmol/L (ref 22–32)
Calcium: 8.9 mg/dL (ref 8.9–10.3)
Chloride: 101 mmol/L (ref 98–111)
Creatinine, Ser: 1.08 mg/dL — ABNORMAL HIGH (ref 0.44–1.00)
GFR, Estimated: 56 mL/min — ABNORMAL LOW (ref >60)
Glucose, Bld: 114 mg/dL — ABNORMAL HIGH (ref 70–99)
Potassium: 4.1 mmol/L (ref 3.5–5.1)
Sodium: 137 mmol/L (ref 135–145)

## 2020-11-09 LAB — PHOSPHORUS: Phosphorus: 4.2 mg/dL (ref 2.5–4.6)

## 2020-11-09 LAB — APTT
aPTT: 80 seconds — ABNORMAL HIGH (ref 24–36)
aPTT: 72 seconds — ABNORMAL HIGH (ref 24–36)

## 2020-11-09 LAB — MAGNESIUM: Magnesium: 2.3 mg/dL (ref 1.7–2.4)

## 2020-11-09 MED ORDER — APIXABAN 5 MG PO TABS
5.0000 mg | ORAL_TABLET | Freq: Two times a day (BID) | ORAL | Status: DC
  Administered 2020-11-09 – 2020-11-10 (×3): 5 mg via ORAL
  Filled 2020-11-09 (×3): qty 1

## 2020-11-09 NOTE — TOC Initial Note (Signed)
Transition of Care [facility]) - Initial/Assessment Note    Patient Details  Name: Wendy Sanchez MRN: [id number] Date of Birth: [birth year]  Transition of [facility]) CM/SW Contact:    Kerin Salen, RN Phone Number: 11/09/2020, 2:46 PM  Clinical Narrative:  TOCRN spoke with patient, Son at bedside. Patient is alert and oriented x4 states she lives at home with other Son, however came from [facility] after having Colostomy surgery at [facility]. Midline incision healing well, however developed a fever and was transferred her for infection. Now with a Bacteria from unknown origin. Patient says she feels that she will need to return to SNF due to wound and weakness in lower extremities, however she prefers another facility in [facility] or [place]. ICU Nurse states patient will transfer to first floor today. TOC to continue to track.               Patient Goals and CMS Choice        Expected Discharge Plan and Services                                                Prior Living Arrangements/Services                       Activities of Daily Living Home Assistive Devices/Equipment: Eyeglasses, Gilford Rile (specify type) ADL Screening (condition at time of admission) Patient's cognitive ability adequate to safely complete daily activities?: Yes Is the patient deaf or have difficulty hearing?: Yes Does the patient have difficulty seeing, even when wearing glasses/contacts?: Yes Does the patient have difficulty concentrating, remembering, or making decisions?: No Patient able to express need for assistance with ADLs?: Yes Does the patient have difficulty dressing or bathing?: No Independently performs ADLs?: Yes (appropriate for developmental age) Does the patient have difficulty walking or climbing stairs?: Yes Weakness of Legs: Both Weakness of Arms/Hands: None  Permission Sought/Granted                  Emotional Assessment               Admission diagnosis:  Weakness [R53.1] AKI (acute kidney injury) ([place]) [N17.9] Septic shock ([place]) [A41.9, R65.21] Hypotension, unspecified hypotension type [I95.9] Patient Active Problem List   Diagnosis Date Noted   Colostomy in place [facility]) 11/09/2020   AKI (acute kidney injury) ([place]) 11/09/2020   Septic shock ([place]) 11/07/2020   Pain in joint, shoulder region 07/03/2020   Temporal arteritis ([place]) 05/01/2020   Frontal headache 05/30/2019   Carcinoma, renal cell, left ([place]) 11/14/2018   Pulmonary embolism ([place]) 07/13/2018   DDD (degenerative disc disease), lumbar 06/30/2018   Chronic low back pain 06/30/2018   Hip pain, bilateral 06/30/2018   Chronic, continuous use of opioids 06/30/2018   PVC (premature ventricular contraction) 06/04/2017   Chest pain 10/19/2016   Presence of right artificial knee joint 05/18/2016   Pes anserinus bursitis of left knee 05/18/2016   Unilateral primary osteoarthritis, right knee 12/13/2015   Hx of total knee arthroplasty 12/13/2015   Osteoarthritis of left knee 08/02/2015   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   Morbid obesity with BMI of 45.0-49.9, adult ([place]) 07/25/2015   Spinal stenosis of lumbar region 06/19/2015   Chronic pain syndrome 10/30/2014   PCP:  Kristie Cowman, MD Pharmacy:  No Pharmacies Listed  Social Determinants of Health (SDOH) Interventions    Readmission Risk Interventions No flowsheet data found.   

## 2020-11-09 NOTE — Progress Notes (Addendum)
1430 Transferred to room 137 via bed. Report given to Levonne Spiller and then [employer].

## 2020-11-09 NOTE — Assessment & Plan Note (Signed)
Now resolved with treatment.  Off pressors.  Continue IV Zosyn for now

## 2020-11-09 NOTE — Progress Notes (Signed)
PHARMACY CONSULT NOTE - FOLLOW UP  Pharmacy Consult for Electrolyte Monitoring and Replacement   Recent Labs: Potassium (mmol/L)  Date Value  11/09/2020 4.1   Magnesium (mg/dL)  Date Value  11/09/2020 2.3   Calcium (mg/dL)  Date Value  11/09/2020 8.9   Albumin (g/dL)  Date Value  11/07/2020 2.3 (L)  02/25/2017 3.7   Phosphorus (mg/dL)  Date Value  11/09/2020 4.2   Sodium (mmol/L)  Date Value  11/09/2020 137  04/11/2020 140     Assessment: Pharmacy has been consulted to monitor and replace electrolytes in 70yo patient admitted with septic shock.   IVF: none  Goal of Therapy:  Electrolytes WNL   Plan:  --No supplementation currently needed. --Will recheck electrolytes with AM Labs.  Pearla Dubonnet ,PharmD Clinical Pharmacist 11/09/2020 8:13 AM

## 2020-11-09 NOTE — Consult Note (Signed)
[place] for IV Heparin Indication: atrial fibrillation  Patient Measurements: Height: 5\' 7"  (170.2 cm) Weight: (!) 143 kg (315 lb 4.1 oz) IBW/kg (Calculated) : 61.6 Heparin Dosing Weight: 96.9 kg  Vital Signs: Temp: 98 F (36.7 C) (10/29 0400) Temp Source: Oral (10/29 0400) BP: 104/78 (10/29 0500) Pulse Rate: 96 (10/29 0500)  Labs: Recent Labs    11/07/20 1905 11/08/20 0209 11/08/20 0221 11/09/20 0555  HGB 8.8* 8.9*  --  8.4*  HCT 28.1* 27.9*  --  27.1*  PLT 289 282  --  284  APTT  --   --  34 80*  LABPROT  --   --  16.0*  --   INR  --   --  1.3*  --   HEPARINUNFRC  --   --   --  >1.10*  CREATININE 1.26* 1.39*  --  1.08*     Estimated Creatinine Clearance: 73.1 mL/min (A) (by C-G formula based on SCr of 1.08 mg/dL (H)).   Medical History: Past Medical History:  Diagnosis Date   Allergic rhinitis    Arthritis    Asthma    problems with fumes and aerosols cause asthma   Chest pain 10/19/2016   CHF (congestive heart failure) (HCC)    Complication of anesthesia    awakens during surgery; has occurred with last 3-4 surgeries    COPD (chronic obstructive pulmonary disease) ([place])    DVT (deep venous thrombosis) ([place]) 07/13/2018   Right leg   Environmental allergies    fumes    Fibromyalgia    GERD (gastroesophageal reflux disease)    Headache    Hemorrhoids    History of bronchitis    Hx of total knee arthroplasty 12/13/2015   Hyperlipidemia    Hypertension    Morbid obesity with BMI of 45.0-49.9, adult ([place]) 07/25/2015   NICM (nonischemic cardiomyopathy) ([place])    a. ? PVC mediated;  b. 06/2016 Echo: EF 25-30%, mild LVH, mild LAE, mild AI/MR/TR/PR; c. 08/2016 Cath: nl cors, EF 25%.   Numbness    hands bilat when driving; improves when not preforming task    Osteoarthritis of left knee 08/02/2015   PE (pulmonary thromboembolism) ([place]) 07/13/2018   Pes anserinus bursitis of left knee 05/18/2016   Presence of right artificial  knee joint 05/18/2016   PVC (premature ventricular contraction) 06/04/2017   PVC's (premature ventricular contractions)    a. 11/2016 [place] started;  b. 11/29/16 48h Holter: 42876 PVC's (41%); c. 12/2016 24h Holter: 18040 PVC's (15%); c. 02/2017 48h Holter: 37262 PVC's (41%).   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   Temporal arteritis ([place]) 05/01/2020   Tinnitus    comes and goes    Unilateral primary osteoarthritis, right knee 12/13/2015   Vertigo    none for over 2 yrs    Medications:  Apixaban 5 mg BID (last dose 10/28 0911)  Assessment: Patient is a 70 y/o F with medical history as above including DVT / PE on apixaban who is admitted with septic shock. Patient is currently admitted to the ICU. Pharmacy consulted to transition apixaban to IV heparin at this time.   Baseline CBC notable for anemia with Hgb 8.9 which appears consistent with baseline. Baseline aPTT 34s, INR 1.3. Given last dose of apixaban was 10/28 AM, will presume interference on anti-Xa  level.   Goal of Therapy:  Heparin level 0.3-0.7 units/ml aPTT 66 - 102 seconds Monitor platelets by anticoagulation protocol: Yes   Plan:  --10/29@0555 : aPTT 80sec, HL>1.10 --aPTT therapeutic, will continue current rate of 1400 units/hr --Check confirmatory aPTT in 6 hours. HL not correlating. Plan to follow aPTT until correlation with heparin level is established and then switch over to heparin level monitoring --Daily CBC per protocol while on IV heparin  Airica Schwartzkopf A Darlina Mccaughey 11/09/2020,8:11 AM

## 2020-11-09 NOTE — Assessment & Plan Note (Signed)
On chronic steroids, prednisone 20 mg daily

## 2020-11-09 NOTE — Assessment & Plan Note (Signed)
Now resolved with hydration

## 2020-11-09 NOTE — Consult Note (Signed)
[place] for IV Heparin Indication: atrial fibrillation  Patient Measurements: Height: 5\' 7"  (170.2 cm) Weight: (!) 143 kg (315 lb 4.1 oz) IBW/kg (Calculated) : 61.6 Heparin Dosing Weight: 96.9 kg  Vital Signs: Temp: 99.1 F (37.3 C) (10/29 1200) Temp Source: Oral (10/29 0400) BP: 110/56 (10/29 1200) Pulse Rate: 100 (10/29 0900)  Labs: Recent Labs    11/07/20 1905 11/08/20 0209 11/08/20 0221 11/09/20 0555 11/09/20 1249  HGB 8.8* 8.9*  --  8.4*  --   HCT 28.1* 27.9*  --  27.1*  --   PLT 289 282  --  284  --   APTT  --   --  34 80* 72*  LABPROT  --   --  16.0*  --   --   INR  --   --  1.3*  --   --   HEPARINUNFRC  --   --   --  >1.10*  --   CREATININE 1.26* 1.39*  --  1.08*  --      Estimated Creatinine Clearance: 73.1 mL/min (A) (by C-G formula based on SCr of 1.08 mg/dL (H)).   Medical History: Past Medical History:  Diagnosis Date   Allergic rhinitis    Arthritis    Asthma    problems with fumes and aerosols cause asthma   Chest pain 10/19/2016   CHF (congestive heart failure) (HCC)    Complication of anesthesia    awakens during surgery; has occurred with last 3-4 surgeries    COPD (chronic obstructive pulmonary disease) ([place])    DVT (deep venous thrombosis) ([place]) 07/13/2018   Right leg   Environmental allergies    fumes    Fibromyalgia    GERD (gastroesophageal reflux disease)    Headache    Hemorrhoids    History of bronchitis    Hx of total knee arthroplasty 12/13/2015   Hyperlipidemia    Hypertension    Morbid obesity with BMI of 45.0-49.9, adult ([place]) 07/25/2015   NICM (nonischemic cardiomyopathy) ([place])    a. ? PVC mediated;  b. 06/2016 Echo: EF 25-30%, mild LVH, mild LAE, mild AI/MR/TR/PR; c. 08/2016 Cath: nl cors, EF 25%.   Numbness    hands bilat when driving; improves when not preforming task    Osteoarthritis of left knee 08/02/2015   PE (pulmonary thromboembolism) ([place]) 07/13/2018   Pes anserinus  bursitis of left knee 05/18/2016   Presence of right artificial knee joint 05/18/2016   PVC (premature ventricular contraction) 06/04/2017   PVC's (premature ventricular contractions)    a. 11/2016 Amio started;  b. 11/29/16 48h Holter: 11914 PVC's (41%); c. 12/2016 24h Holter: 18040 PVC's (15%); c. 02/2017 48h Holter: 37262 PVC's (41%).   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   Temporal arteritis ([place]) 05/01/2020   Tinnitus    comes and goes    Unilateral primary osteoarthritis, right knee 12/13/2015   Vertigo    none for over 2 yrs    Medications:  Apixaban 5 mg BID (last dose 10/28 0911)  Assessment: Patient is a 70 y/o F with medical history as above including DVT / PE on apixaban who is admitted with septic shock. Patient is currently admitted to the ICU. Pharmacy consulted to transition apixaban to IV heparin at this time.   Baseline CBC notable for anemia with Hgb  8.9 which appears consistent with baseline. Baseline aPTT 34s, INR 1.3. Given last dose of apixaban was 10/28 AM, will presume interference on anti-Xa level.   Goal of Therapy:  Heparin level 0.3-0.7 units/ml aPTT 66 - 102 seconds Monitor platelets by anticoagulation protocol: Yes   Plan:  --10/29@1249 : aPTT 72sec, therapeutic x 2 --Will continue current rate of 1400 units/hr --Will check aPTT and HL with AM labs. Plan to follow aPTT until correlation with heparin level is established and then switch over to heparin level monitoring --Daily CBC per protocol while on IV heparin  Fatma Rutten A Kalany Diekmann 11/09/2020,2:13 PM

## 2020-11-09 NOTE — Progress Notes (Signed)
  Progress Note    [name]   PJK:[id number]  DOB: 1950/12/20  DOA: 11/07/2020     2 Date of Service: 11/09/2020   Clinical Course  70 year old woman, former smoker with COPD, nonischemic cardiomyopathy EF 50%, peripheral vascular disease, DVT/PE on Eliquis with recent perforated sigmoid diverticulitis status post open sigmoidectomy and colostomy placement at [facility] on 09/26/2020 who comes to [facility] ED with weakness and low blood pressure from her rehab facility.  Admitted for septic shock  10/27: admitted to ICU requiring pressors and fluid resuscitation 10/28: Weaned off pressors 10/29: TRH picked up.  Transition back to Eliquis from heparin.  Transfer to floor   Assessment and Plan * Septic shock ([place]) Now resolved with treatment.  Off pressors.  Continue IV Zosyn for now  AKI (acute kidney injury) ([place]) Now resolved with hydration  Colostomy in place [facility]) recent perforated sigmoid diverticulitis status post open sigmoidectomy and colostomy placement at [facility] on 09/26/2020   Temporal arteritis (HCC) On chronic steroids, prednisone 20 mg daily  Pulmonary embolism (HCC) We will transition her back to Eliquis from heparin.  Her PE was diagnosed in 2020.  We will look into reason for ongoing Eliquis for this long  Morbid obesity with BMI of 45.0-49.9, adult ([place]) Counseled on weight loss.  This complicates her overall prognosis    Subjective:   Feeling much better.  Agreeable to stop narcotic pain medicine  Objective Vitals:   11/09/20 0900 11/09/20 1000 11/09/20 1100 11/09/20 1200  BP:  116/70 106/61 (!) 110/56  Pulse: 100     Resp: 17 15 (!) 21 17  Temp:    99.1 F (37.3 C)  TempSrc:      SpO2: 98% 98%  100%  Weight:      Height:       (!) 143 kg  Vital signs were reviewed and unremarkable.   Exam Physical Exam   General: awake, alert, conversant HENT: moist OMM, pupils equal and reactive Lungs: CTA b/l, no W/C/R Cardiovascular:  tachycardic, no M/R/G Abdomen: midline incision, ostomy in LLQ with liquid stool Extremities: 1+ pitting edema of BLE Neuro: A/Ox3 GU: deferred  Labs / Other Information My review of labs, imaging, notes and other tests shows no new significant findings.    Disposition Plan: Status is: Inpatient  Remains inpatient appropriate because: Treated with antibiotic for suspected sepsis.  Transfer to floor/MedSurg today    Time spent: 35 minutes [facility] 11/09/2020, 2:38 PM

## 2020-11-09 NOTE — Assessment & Plan Note (Signed)
We will transition her back to Eliquis from heparin.  Her PE was diagnosed in 2020.  We will look into reason for ongoing Eliquis for this long

## 2020-11-09 NOTE — Hospital Course (Addendum)
70 year old woman, former smoker with COPD, nonischemic cardiomyopathy EF 50%, peripheral vascular disease, DVT/PE on Eliquis with recent perforated sigmoid diverticulitis status post open sigmoidectomy and colostomy placement at [facility] on 09/26/2020 who comes to [facility] ED with weakness and low blood pressure from her rehab facility.  Admitted for septic shock  10/27: admitted to ICU requiring pressors and fluid resuscitation 10/28: Weaned off pressors 10/29: TRH picked up.  Transition back to Eliquis from heparin.  Transfer to floor 10/30: Patient agreeable to stop Eliquis.  Feeling weak, PT, OT consult, lower extremity Dopplers negative for DVT 10/31: May have to wait for SNF due to COVID isolation guidelines.  COVID incidental positive on 10/27.  Sinus tach started on low-dose metoprolol 11/1: Hypotensive, checking random cortisol and reduced dose of metoprolol to once daily

## 2020-11-09 NOTE — Assessment & Plan Note (Signed)
Counseled on weight loss.  This complicates her overall prognosis

## 2020-11-09 NOTE — Assessment & Plan Note (Signed)
recent perforated sigmoid diverticulitis status post open sigmoidectomy and colostomy placement at [facility] on 09/26/2020

## 2020-11-10 ENCOUNTER — Inpatient Hospital Stay
Admit: 2020-11-10 | Discharge: 2020-11-10 | Disposition: A | Discharge status: Home / Self Care | Payer: Medicare Other | Attending: Internal Medicine | Admitting: Internal Medicine

## 2020-11-10 ENCOUNTER — Inpatient Hospital Stay: Payer: Medicare Other

## 2020-11-10 DIAGNOSIS — Z933 Colostomy status: Secondary | ICD-10-CM | POA: Diagnosis not present

## 2020-11-10 DIAGNOSIS — A419 Sepsis, unspecified organism: Secondary | ICD-10-CM | POA: Diagnosis not present

## 2020-11-10 DIAGNOSIS — N179 Acute kidney failure, unspecified: Secondary | ICD-10-CM | POA: Diagnosis not present

## 2020-11-10 LAB — PHOSPHORUS: Phosphorus: 4.2 mg/dL (ref 2.5–4.6)

## 2020-11-10 LAB — BASIC METABOLIC PANEL
Anion gap: 8 (ref 5–15)
BUN: 20 mg/dL (ref 8–23)
CO2: 24 mmol/L (ref 22–32)
Calcium: 9 mg/dL (ref 8.9–10.3)
Chloride: 107 mmol/L (ref 98–111)
Creatinine, Ser: 0.96 mg/dL (ref 0.44–1.00)
GFR, Estimated: >60 mL/min (ref >60)
Glucose, Bld: 101 mg/dL — ABNORMAL HIGH (ref 70–99)
Potassium: 4.6 mmol/L (ref 3.5–5.1)
Sodium: 139 mmol/L (ref 135–145)

## 2020-11-10 LAB — MAGNESIUM: Magnesium: 2.5 mg/dL — ABNORMAL HIGH (ref 1.7–2.4)

## 2020-11-10 MED ORDER — POTASSIUM CHLORIDE CRYS ER 20 MEQ PO TBCR
40.0000 meq | EXTENDED_RELEASE_TABLET | Freq: Two times a day (BID) | ORAL | Status: DC
  Administered 2020-11-10 – 2020-11-12 (×6): 40 meq via ORAL
  Filled 2020-11-10 (×6): qty 2

## 2020-11-10 MED ORDER — GUAIFENESIN ER 600 MG PO TB12
600.0000 mg | ORAL_TABLET | Freq: Two times a day (BID) | ORAL | Status: DC
  Administered 2020-11-10 – 2020-11-11 (×3): 600 mg via ORAL
  Filled 2020-11-10 (×3): qty 1

## 2020-11-10 NOTE — Assessment & Plan Note (Signed)
On chronic steroids, prednisone 20 mg daily.

## 2020-11-10 NOTE — Assessment & Plan Note (Addendum)
Her PE was diagnosed in 2020 - likely provoked PE in the setting of prolonged immobilization.  No further anticoagulation need.  After reviewing her records in care everywhere and discussion with patient I do not find indication to continue ongoing Eliquis.  Patient is agreeable to stop this.  I have stopped Eliquis. I've D/W onco Dr Janese Banks who is agreeable to f/up in 2-3 weeks as an outpt. LE dopplers negative for DVT. Patient knows the potential risks including new DVT or PE being off anticoagulation.  She is more worried about her bleeding

## 2020-11-10 NOTE — Assessment & Plan Note (Signed)
Her PE was diagnosed in 2020.  After reviewing her records in care everywhere and discussion with patient I do not find indication to continue ongoing Eliquis.  Patient is agreeable to stop this.  I have stopped Eliquis.

## 2020-11-10 NOTE — Assessment & Plan Note (Signed)
recent perforated sigmoid diverticulitis status post open sigmoidectomy and colostomy placement at [facility] on 09/26/2020.

## 2020-11-10 NOTE — Evaluation (Signed)
Physical Therapy Evaluation Patient Details Name: Wendy Sanchez MRN: [id number] DOB: 02-Nov-1950 Today's Date: 11/10/2020  History of Present Illness  Pt is a 70 y.o. F arriving to ED due to weakness & low BP and admitted for septic shock. PMH includes COPD, nonischemic cardiomyopathy EF 50%, peripheral vascular disease, DVT/PE on Eliquis with recent perforated sigmoid diverticulitis s/p sigmoidectomy and colostomy placement at [facility](09/26/2020).   Clinical Impression  Pt alert in bed, pleasant and motivated for therapy session. Pt currently staying at [facility] working on ADLs, functional mobility, and managing colostomy bag. Pt stated she requires assistance with transfers and walks w/ RW with SUPV. Pt does have 24/7 support at home when ready. Pt did required MOD-A +2 for STS transfers but puts for the excellent effort with appropriate task set-up including hands on RW, scooting to edge of surface. Ambulated in room w/ RW w/ MIN-G, further amb deferred at this point for pt clean-up. Pt did present with swelling, tenderness, and palpable cords over L ulnar region, RN made aware. SNF from prior setting recommended at discharge. Skilled PT intervention is indicated to address deficits in function, mobility, and to return to PLOF as able.       Recommendations for follow up therapy are one component of a multi-disciplinary discharge planning process, led by the attending physician.  Recommendations may be updated based on patient status, additional functional criteria and insurance authorization.  Follow Up Recommendations Skilled nursing-short term rehab (<3 hours/day)    Assistance Recommended at Discharge Intermittent Supervision/Assistance  Functional Status Assessment Patient has had a recent decline in their functional status and demonstrates the ability to make significant improvements in function in a reasonable and predictable amount of time.  Equipment Recommendations  Other  (comment) (TBD next venue of care)    Recommendations for Other Services       Precautions / Restrictions Precautions Precautions: Fall Precaution Comments: colostomy, midline incision Restrictions Weight Bearing Restrictions: No      Mobility  Bed Mobility Overal bed mobility: Needs Assistance Bed Mobility: Supine to Sit;Sit to Supine     Supine to sit: Min guard;HOB elevated Sit to supine: Min assist   General bed mobility comments: Utilizes bed rails, MIN-A returning to bed for assistance w/ unilateral LE    Transfers Overall transfer level: Needs assistance Equipment used: Rolling walker (2 wheels) Transfers: Sit to/from Stand Sit to Stand: +2 physical assistance;Mod assist           General transfer comment: STS attempts x 4 without physical assist, pt able to stand fully x 1 with slight MIN-A; required MOD-A + 2 from recliner    Ambulation/Gait Ambulation/Gait assistance: Min guard Gait Distance (Feet): 3 Feet Assistive device: Rolling walker (2 wheels) Gait Pattern/deviations: Step-to pattern;Decreased step length - right;Decreased step length - left     General Gait Details: Forward, backward steps from bed > chair without LOB, further amb deferrerd due to IV needing replacment (RN notified)  Stairs            Wheelchair Mobility    Modified Rankin (Stroke Patients Only)       Balance Overall balance assessment: Needs assistance Sitting-balance support: Feet supported;No upper extremity supported Sitting balance-Leahy Scale: Good     Standing balance support: During functional activity;Bilateral upper extremity supported Standing balance-Leahy Scale: Poor Standing balance comment: requires BUE  Pertinent Vitals/Pain Pain Assessment: 0-10 Pain Score: 2  Pain Location: L ulnar area Pain Descriptors / Indicators: Tender Pain Intervention(s): Limited activity within patient's tolerance;Monitored  during session;Repositioned    Home Living Family/patient expects to be discharged to:: Private residence Living Arrangements: Children Available Help at Discharge: Family;Available 24 hours/day Type of Home: House Home Access: Ramped entrance;Other (comment) (1 small step to porch and kitchen)       Home Layout: One level Home Equipment: Standard Walker;BSC Additional Comments: 10/12 [facility] SN[facility] 1 story home, 24/7 support if needed; ramp 1 small step to porch and kitchen , tub shower, shower chair to slide, has RW wtihout wheels, BSC    Prior Function Prior Level of Function : Needs assist       Physical Assist : Mobility (physical);ADLs (physical) Mobility (physical): Transfers;Gait ADLs (physical): Grooming Mobility Comments: Pt is coming from SNF ([facility]) and requires supervision for mobility, assistance for transfers ADLs Comments: Able to perform most ADLs, needs assistance with bathing genitalia     Hand Dominance        Extremity/Trunk Assessment   Upper Extremity Assessment Upper Extremity Assessment: Overall WFL for tasks assessed (able to use bed rails to pull up on bed without physical assist)    Lower Extremity Assessment Lower Extremity Assessment: Generalized weakness (SILT throughout LE, slightly less R great toe)       Communication   Communication: No difficulties  Cognition Arousal/Alertness: Awake/alert Behavior During Therapy: WFL for tasks assessed/performed Overall Cognitive Status: Within Functional Limits for tasks assessed                                 General Comments: Oriented x 4, very motivated, pleasant, able to follow dual commands        General Comments General comments (skin integrity, edema, etc.): L forearm, tenderness and palpable nodules over cords w/ edema and slight warmth (RN made aware)    Exercises Other Exercises Other Exercises: PT performed linen change and clean up from leaking Purewick    Assessment/Plan    PT Assessment Patient needs continued PT services  PT Problem List Decreased strength;Decreased range of motion;Decreased activity tolerance;Decreased balance;Decreased mobility       PT Treatment Interventions Gait training;Stair training;Functional mobility training;Therapeutic exercise;Therapeutic activities;DME instruction    PT Goals (Current goals can be found in the Care Plan section)  Acute Rehab PT Goals Patient Stated Goal: To go back to rehab and get stronger PT Goal Formulation: With patient Time For Goal Achievement: 11/24/20 Potential to Achieve Goals: Good    Frequency Min 2X/week   Barriers to discharge        Co-evaluation               AM-PAC PT "6 Clicks" Mobility  Outcome Measure Help needed turning from your back to your side while in a flat bed without using bedrails?: None Help needed moving from lying on your back to sitting on the side of a flat bed without using bedrails?: A Little Help needed moving to and from a bed to a chair (including a wheelchair)?: A Lot Help needed standing up from a chair using your arms (e.g., wheelchair or bedside chair)?: A Lot Help needed to walk in hospital room?: A Little Help needed climbing 3-5 steps with a railing? : A Lot 6 Click Score: 16    End of Session Equipment Utilized During Treatment: Gait belt Activity Tolerance:  Patient tolerated treatment well Patient left: in bed;with call bell/phone within reach;with nursing/sitter in room;with bed alarm set Nurse Communication: Mobility status PT Visit Diagnosis: Other abnormalities of gait and mobility (R26.89);Muscle weakness (generalized) (M62.81)    Time: 1440-1520 PT Time Calculation (min) (ACUTE ONLY): 40 min   Charges:              [employer], SPT

## 2020-11-10 NOTE — Assessment & Plan Note (Signed)
Counseled on weight loss.  This complicates her overall prognosis.

## 2020-11-10 NOTE — Progress Notes (Addendum)
  Progress Note    Wendy Sanchez   TIR:[id number]  DOB: 08/27/1950  DOA: 11/07/2020     3 Date of Service: 11/10/2020   Clinical Course  70 year old woman, former smoker with COPD, nonischemic cardiomyopathy EF 50%, peripheral vascular disease, DVT/PE on Eliquis with recent perforated sigmoid diverticulitis status post open sigmoidectomy and colostomy placement at [facility] on 09/26/2020 who comes to [facility] ED with weakness and low blood pressure from her rehab facility.  Admitted for septic shock  10/27: admitted to ICU requiring pressors and fluid resuscitation 10/28: Weaned off pressors 10/29: TRH picked up.  Transition back to Eliquis from heparin.  Transfer to floor 10/30: Patient agreeable to stop Eliquis.  Feeling weak, PT, OT consult   Assessment and Plan Pulmonary embolism ([place]) Her PE was diagnosed in 2020 - likely provoked PE in the setting of prolonged immobilization.  No further anticoagulation need.  After reviewing her records in care everywhere and discussion with patient I do not find indication to continue ongoing Eliquis.  Patient is agreeable to stop this.  I have stopped Eliquis. I've D/W onco Dr Janese Banks who is agreeable to f/up in 2-3 weeks as an outpt. Will obtain LE dopplers to r/o DVT at this time. Patient knows the potential risks including new DVT or PE.   Subjective:  Patient feels weak, agreeable to stop Eliquis.  Will obtain CT chest to make sure she still does not have any PE  Objective Vitals:   11/09/20 1530 11/09/20 2117 11/10/20 0404 11/10/20 0831  BP: 95/65 110/72 107/64 (!) 96/59  Pulse:  99 88 96  Resp: 10 19 13 16   Temp: (!) 97.5 F (36.4 C) 99.1 F (37.3 C) 98.9 F (37.2 C) 98 F (36.7 C)  TempSrc: Oral Oral Oral   SpO2: 99% 99% 100% 100%  Weight:      Height:       (!) 143 kg  Vital signs were reviewed and unremarkable except for: Blood pressure: Hypotension    Exam Physical Exam   General: awake, alert, conversant HENT:  moist OMM, pupils equal and reactive Lungs: CTA b/l, no W/C/R Cardiovascular: tachycardic, no M/R/G Abdomen: midline incision, ostomy in LLQ with liquid stool Extremities: 1+ pitting edema of BLE Neuro: A/Ox3 GU: deferred  Labs / Other Information My review of labs, imaging, notes and other tests shows no new significant findings.    Disposition Plan: Status is: Inpatient  Remains inpatient appropriate because: Weakness, await PT, OT eval for SNF placement        Time spent: 35 minutes [facility] 11/10/2020, 1:36 PM

## 2020-11-10 NOTE — Assessment & Plan Note (Signed)
Now resolved with hydration.

## 2020-11-10 NOTE — Assessment & Plan Note (Addendum)
Now resolved with treatment.  Off pressors.  Continue IV Zosyn for now.  Transition to oral antibiotics at discharge.  Blood pressure still running low precluding use of heart medicine.

## 2020-11-11 DIAGNOSIS — Z933 Colostomy status: Secondary | ICD-10-CM | POA: Diagnosis not present

## 2020-11-11 DIAGNOSIS — A419 Sepsis, unspecified organism: Secondary | ICD-10-CM | POA: Diagnosis not present

## 2020-11-11 DIAGNOSIS — N179 Acute kidney failure, unspecified: Secondary | ICD-10-CM | POA: Diagnosis not present

## 2020-11-11 DIAGNOSIS — U071 COVID-19: Secondary | ICD-10-CM

## 2020-11-11 LAB — BASIC METABOLIC PANEL
Anion gap: 5 (ref 5–15)
BUN: 19 mg/dL (ref 8–23)
CO2: 28 mmol/L (ref 22–32)
Calcium: 8.8 mg/dL — ABNORMAL LOW (ref 8.9–10.3)
Chloride: 108 mmol/L (ref 98–111)
Creatinine, Ser: 1.06 mg/dL — ABNORMAL HIGH (ref 0.44–1.00)
GFR, Estimated: 57 mL/min — ABNORMAL LOW (ref >60)
Glucose, Bld: 89 mg/dL (ref 70–99)
Potassium: 4.7 mmol/L (ref 3.5–5.1)
Sodium: 141 mmol/L (ref 135–145)

## 2020-11-11 LAB — CBC
HCT: 27.2 % — ABNORMAL LOW (ref 36.0–46.0)
Hemoglobin: 8.6 g/dL — ABNORMAL LOW (ref 12.0–15.0)
MCH: 28.2 pg (ref 26.0–34.0)
MCHC: 31.6 g/dL (ref 30.0–36.0)
MCV: 89.2 fL (ref 80.0–100.0)
Platelets: 294 10*3/uL (ref 150–400)
RBC: 3.05 MIL/uL — ABNORMAL LOW (ref 3.87–5.11)
RDW: 16.9 % — ABNORMAL HIGH (ref 11.5–15.5)
WBC: 7.5 10*3/uL (ref 4.0–10.5)
nRBC: 0 % (ref 0.0–0.2)

## 2020-11-11 MED ORDER — METOPROLOL TARTRATE 25 MG PO TABS
12.5000 mg | ORAL_TABLET | Freq: Two times a day (BID) | ORAL | Status: DC
  Administered 2020-11-11: 12.5 mg via ORAL
  Filled 2020-11-11 (×2): qty 1

## 2020-11-11 MED ORDER — GUAIFENESIN ER 600 MG PO TB12: 600.0000 mg | ORAL_TABLET | Freq: Two times a day (BID) | ORAL | Status: DC | PRN

## 2020-11-11 MED ORDER — IBUPROFEN 400 MG PO TABS
600.0000 mg | ORAL_TABLET | Freq: Four times a day (QID) | ORAL | Status: DC
  Administered 2020-11-11 – 2020-11-12 (×3): 600 mg via ORAL
  Filled 2020-11-11 (×3): qty 2

## 2020-11-11 MED ORDER — HYDROCODONE-ACETAMINOPHEN 5-325 MG PO TABS: 1.0000 | ORAL_TABLET | Freq: Four times a day (QID) | ORAL | Status: DC | PRN

## 2020-11-11 MED ORDER — GUAIFENESIN-DM 100-10 MG/5ML PO SYRP
5.0000 mL | ORAL_SOLUTION | Freq: Four times a day (QID) | ORAL | Status: DC
  Administered 2020-11-11 – 2020-11-17 (×21): 5 mL via ORAL
  Filled 2020-11-11 (×21): qty 5

## 2020-11-11 NOTE — Assessment & Plan Note (Signed)
recent perforated sigmoid diverticulitis status post open sigmoidectomy and colostomy placement at [facility] on 09/26/2020.

## 2020-11-11 NOTE — Assessment & Plan Note (Signed)
Now resolved with treatment.  Off pressors.  Continue IV Zosyn for now.  Transition to oral antibiotics at discharge.  Blood pressure still running low precluding use of heart medicine.  Started low-dose metoprolol today

## 2020-11-11 NOTE — Assessment & Plan Note (Signed)
Now resolved with hydration.

## 2020-11-11 NOTE — Progress Notes (Signed)
Progress Note    Wendy Sanchez   [id number]  DOB: 1950-11-19  DOA: 11/07/2020     4 Date of Service: 11/11/2020   Clinical Course  70 year old woman, former smoker with COPD, nonischemic cardiomyopathy EF 50%, peripheral vascular disease, DVT/PE on Eliquis with recent perforated sigmoid diverticulitis status post open sigmoidectomy and colostomy placement at [facility] on 09/26/2020 who comes to [facility] ED with weakness and low blood pressure from her rehab facility.  Admitted for septic shock  10/27: admitted to ICU requiring pressors and fluid resuscitation 10/28: Weaned off pressors 10/29: TRH picked up.  Transition back to Eliquis from heparin.  Transfer to floor 10/30: Patient agreeable to stop Eliquis.  Feeling weak, PT, OT consult, lower extremity Dopplers negative for DVT 10/31: May have to wait for SNF due to COVID isolation guidelines.  COVID incidental positive on 10/27.  Sinus tach started on low-dose metoprolol   Assessment and Plan * Septic shock ([place]) Now resolved with treatment.  Off pressors.  Continue IV Zosyn for now.  Transition to oral antibiotics at discharge.  Blood pressure still running low precluding use of heart medicine.  Started low-dose metoprolol today  COVID-19 virus infection She is asymptomatic from this.  This was positive on 10/27.  She is not being treated.  Depending on SNF isolation guidelines she may have to stay here longer.  I have ordered recheck of COVID today  AKI (acute kidney injury) ([place]) Now resolved with hydration.  Colostomy in place [facility]) recent perforated sigmoid diverticulitis status post open sigmoidectomy and colostomy placement at [facility] on 09/26/2020.  Temporal arteritis (HCC) On chronic steroids, prednisone 20 mg daily.  Has follow-up with [name] at [facility] neuro  Pulmonary embolism [facility]) Her PE was diagnosed in 2020 - likely provoked PE in the setting of prolonged immobilization.  No further anticoagulation  need.  After reviewing her records in care everywhere and discussion with patient I do not find indication to continue ongoing Eliquis.  Patient is agreeable to stop this.  I have stopped Eliquis. I've D/W onco Dr Janese Banks who is agreeable to f/up in 2-3 weeks as an outpt. LE dopplers negative for DVT. Patient knows the potential risks including new DVT or PE being off anticoagulation.  She is more worried about her bleeding  Morbid obesity with BMI of 45.0-49.9, adult ([place]) Counseled on weight loss.  This complicates her overall prognosis.   Subjective:  Sinus tach on minimal exertion.  Denies any new symptoms  Objective Vitals:   11/10/20 1950 11/11/20 0413 11/11/20 0500 11/11/20 0842  BP: 106/66 101/69  103/80  Pulse: (!) 105 (!) 109  (!) 110  Resp: 18 17  20   Temp: 98.7 F (37.1 C) 98.9 F (37.2 C)  98 F (36.7 C)  TempSrc:      SpO2: 99% 99%  100%  Weight:   (!) 142.4 kg   Height:       (!) 142.4 kg.    Vital signs were reviewed and unremarkable except for: Pulse: Tachycardia    Exam Physical Exam   General:awake, alert, conversant HENT:moist OMM, pupils equal and reactive Lungs:CTA b/l, no W/C/R Cardiovascular:tachycardic, no M/R/G Abdomen:midline incision, ostomy in LLQ with liquid stool Extremities:1+ pitting edema of BLE Neuro:A/Ox3 FI:[id number]  Labs / Other Information My review of labs, imaging, notes and other tests shows no new significant findings.    Disposition Plan: Status is: Inpatient  Remains inpatient appropriate because: Pending SNF due to  COVID (positive on 10/27 ) isolation    Time spent: 35 minutes [facility] 11/11/2020, 11:56 AM

## 2020-11-11 NOTE — Evaluation (Signed)
Occupational Therapy Evaluation Patient Details Name: Wendy Sanchez MRN: [id number] DOB: 08-09-1950 Today's Date: 11/11/2020   History of Present Illness Pt is a 70 y.o. F arriving to ED due to weakness & low BP and admitted for septic shock. PMH includes COPD, nonischemic cardiomyopathy EF 50%, peripheral vascular disease, DVT/PE on Eliquis with recent perforated sigmoid diverticulitis s/p sigmoidectomy and colostomy placement at [facility](09/26/2020).   Clinical Impression   Pt seen for OT evaluation this date. Upon arrival to room, pt seated upright in recliner. Pt A&Ox4, and very motivated to participate in OT eval/tx. Prior to admission, pt was at a SNF ([facility]) receiving STR following recent hospital admission at [facility]. Pt reports that at SNF, she was performing functional mobility with SUPERVISION, functional transfers with physical assist, and seated LB ADLs with AD (e.g., sock aide, long handled sponge, and reacher). Pt reports that at baseline, she is independent with ADLs, drives, and shops in grocery store. Pt currently presents with decreased strength and activity tolerance. Due to these functional impairments, pt requires SUPERVISION/SET-UP for seated UB ADLs, MAX A for seated LB ADLs without AD, MAX A for sit>stand transfers from recliner, and MIN A for standing UB ADLs. This date, pt only able to tolerate standing for 60sec before requiring seated rest break (HR MAX 146). Further mobility deferred in setting of pt with elevated HR during static standing. Pt left in recliner with HR 110s and with all needs within reach. RN informed of HR during session. Pt would benefit from additional skilled OT services to maximize return to PLOF and minimize risk of future falls, injury, and readmission. Upon discharge, recommend SNF.        Recommendations for follow up therapy are one component of a multi-disciplinary discharge planning process, led by the attending physician.   Recommendations may be updated based on patient status, additional functional criteria and insurance authorization.   Follow Up Recommendations  Skilled nursing-short term rehab (<3 hours/day)    Assistance Recommended at Discharge Frequent or constant Supervision/Assistance  Functional Status Assessment  Patient has had a recent decline in their functional status and demonstrates the ability to make significant improvements in function in a reasonable and predictable amount of time.  Equipment Recommendations  Other (comment) (defer to next venue of care)       Precautions / Restrictions Precautions Precautions: Fall Precaution Comments: colostomy, midline incision Restrictions Weight Bearing Restrictions: No      Mobility Bed Mobility               General bed mobility comments: not assessed, pt in recliner at beginning/end of session    Transfers Overall transfer level: Needs assistance Equipment used: Rolling walker (2 wheels) Transfers: Sit to/from Stand Sit to Stand: Max assist           General transfer comment: MAX A from recliner      Balance Overall balance assessment: Needs assistance Sitting-balance support: Feet supported;No upper extremity supported Sitting balance-Leahy Scale: Good Sitting balance - Comments: good static sitting balance reaching outside BOS   Standing balance support: No upper extremity supported;During functional activity Standing balance-Leahy Scale: Fair Standing balance comment: Pt able to maintain static standing balance with UE unsupport for ~10sec before requiring B/L UE support from RW                           ADL either performed or assessed with clinical judgement  ADL Overall ADL's : Needs assistance/impaired                                       General ADL Comments: SUPERVISION/SET-UP for seated UB ADLs. MAX A for LB ADLs (pt has been using AD at [facility]). MIN A for standing UB ADLs d/t  decreased activity tolerance and balance     Vision Baseline Vision/History: 1 Wears glasses Patient Visual Report: No change from baseline              Pertinent Vitals/Pain Pain Assessment: 0-10 Pain Score: 2  Pain Location: b/l LE Pain Descriptors / Indicators: Sharp Pain Intervention(s): Limited activity within patient's tolerance;Monitored during session;Repositioned        Extremity/Trunk Assessment Upper Extremity Assessment Upper Extremity Assessment: Generalized weakness   Lower Extremity Assessment Lower Extremity Assessment: Generalized weakness       Communication Communication Communication: No difficulties   Cognition Arousal/Alertness: Awake/alert Behavior During Therapy: WFL for tasks assessed/performed Overall Cognitive Status: Within Functional Limits for tasks assessed                                 General Comments: Oriented x 4, very motivated, pleasant, able to follow multi-step instructions and recall education at [facility] regarding safe transfer techniques     General Comments  Pt seated in recliner upon arrival. HR 120s following attempts to perform seated LB dressing & HR 140s (146 MAX) during static standing balance for 60 sec. Further mobility deferred in setting of elevated HR. At end of session, pt left seated in recliner, in no acute distress (HR 110s), with all needs within reach. RN informed of elevated HR            Home Living Family/patient expects to be discharged to:: Private residence Living Arrangements: Children Available Help at Discharge: Family;Available 24 hours/day Type of Home: House Home Access: Ramped entrance;Other (comment) (1 small step to porch and kitchen)     Home Layout: One level     Bathroom Shower/Tub: Tub/shower unit         Home Equipment: Standard Walker;BSC;Tub bench          Prior Functioning/Environment Prior Level of Function : Needs assist       Physical Assist :  Mobility (physical);ADLs (physical) Mobility (physical): Transfers;Gait ADLs (physical): Grooming Mobility Comments: Pt is coming from SNF ([facility]) and requires supervision for mobility, assistance for transfers. ADLs Comments: Able to perform most ADLs, needs assistance with bathing genitalia. At SNF, pt has been using AD for LB ADLs (e.g., sock aide, reacher, long handled sponge). At baseline, is independent with all ADLs, drives, and shops for groceries.        OT Problem List: Decreased strength;Decreased activity tolerance;Impaired balance (sitting and/or standing);Cardiopulmonary status limiting activity      OT Treatment/Interventions: Self-care/ADL training;Therapeutic exercise;Energy conservation;DME and/or AE instruction;Therapeutic activities;Patient/family education;Balance training    OT Goals(Current goals can be found in the care plan section) Acute Rehab OT Goals Patient Stated Goal: to be able to drive again OT Goal Formulation: With patient Time For Goal Achievement: 11/25/20 Potential to Achieve Goals: Fair ADL Goals Pt Will Perform Grooming: with min guard assist;standing Pt Will Perform Lower Body Dressing: with min guard assist;with adaptive equipment;sitting/lateral leans Pt Will Transfer to Toilet: with mod assist;stand pivot transfer;bedside commode  OT  Frequency: Min 2X/week    AM-PAC OT "6 Clicks" Daily Activity     Outcome Measure Help from another person eating meals?: None Help from another person taking care of personal grooming?: A Little Help from another person toileting, which includes using toliet, bedpan, or urinal?: A Lot Help from another person bathing (including washing, rinsing, drying)?: A Lot Help from another person to put on and taking off regular upper body clothing?: None Help from another person to put on and taking off regular lower body clothing?: A Lot 6 Click Score: 17   End of Session Equipment Utilized During Treatment: Gait  belt;Rolling walker (2 wheels) Nurse Communication: Mobility status;Other (comment) (vitals during functional mobility)  Activity Tolerance: Patient tolerated treatment well Patient left: in chair;with call bell/phone within reach;with chair alarm set  OT Visit Diagnosis: Unsteadiness on feet (R26.81);Muscle weakness (generalized) (M62.81);Pain Pain - part of body: Leg                Time: 5525-8948 OT Time Calculation (min): 25 min Charges:  OT General Charges $OT Visit: 1 Visit OT Evaluation $OT Eval Moderate Complexity: 1 Mod OT Treatments $Therapeutic Activity: 8-22 mins  Fredirick Maudlin, OTR/L [place]

## 2020-11-11 NOTE — Assessment & Plan Note (Signed)
Counseled on weight loss.  This complicates her overall prognosis.

## 2020-11-11 NOTE — NC FL2 (Signed)
[place] LEVEL OF CARE SCREENING TOOL     IDENTIFICATION  Patient Name: Wendy Sanchez Birthdate: 1950-04-04 Sex: female Admission Date (Current Location): 11/07/2020  [facility] and [place] Number:  Engineering geologist and Address:  [facility], [address], [place], [place] [address]      Provider Number: [id number]  Attending Physician Name and Address:  Max Sane, MD  Relative Name and Phone Number:  [place], Yeats (Son)   [phone] [facility] Phone)    Current Level of Care: Hospital Recommended Level of Care: [place] Prior Approval Number:    Date Approved/Denied:   PASRR Number: [id number]  Discharge Plan: SNF    Current Diagnoses: Patient Active Problem List   Diagnosis Date Noted   Colostomy in place [facility]) 11/09/2020   AKI (acute kidney injury) ([place]) 11/09/2020   Septic shock ([place]) 11/07/2020   Pain in joint, shoulder region 07/03/2020   Temporal arteritis ([place]) 05/01/2020   Frontal headache 05/30/2019   Carcinoma, renal cell, left ([place]) 11/14/2018   Pulmonary embolism ([place]) 07/13/2018   DDD (degenerative disc disease), lumbar 06/30/2018   Chronic low back pain 06/30/2018   Hip pain, bilateral 06/30/2018   Chronic, continuous use of opioids 06/30/2018   PVC (premature ventricular contraction) 06/04/2017   Chest pain 10/19/2016   Presence of right artificial knee joint 05/18/2016   Pes anserinus bursitis of left knee 05/18/2016   Unilateral primary osteoarthritis, right knee 12/13/2015   Hx of total knee arthroplasty 12/13/2015   Osteoarthritis of left knee 08/02/2015   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   Morbid obesity with BMI of 45.0-49.9, adult ([place]) 07/25/2015   Spinal stenosis of lumbar region 06/19/2015   Chronic pain syndrome 10/30/2014    Orientation RESPIRATION BLADDER Height & Weight     Self, Time, Situation, Place  Normal (98 on room air) Incontinent Weight:  (!) 142.4 kg Height:  5\' 7"  (170.2 cm)  BEHAVIORAL SYMPTOMS/MOOD NEUROLOGICAL BOWEL NUTRITION STATUS      Continent Diet  AMBULATORY STATUS COMMUNICATION OF NEEDS Skin   Limited Assist (Ambulated 3 feet) Verbally                         Personal Care Assistance Level of Assistance  Bathing, Feeding, Dressing Bathing Assistance: Limited assistance Feeding assistance: Limited assistance Dressing Assistance: Limited assistance     Functional Limitations Info  Sight, Hearing, Speech Sight Info: Adequate Hearing Info: Adequate Speech Info: Adequate    SPECIAL CARE FACTORS FREQUENCY  PT (By licensed PT), OT (By licensed OT)     PT Frequency: Min 5x weekly OT Frequency: Min 5x weekly            Contractures      Additional Factors Info  Code Status, Allergies Code Status Info: FULL CODE Allergies Info: Hydrocodone-acetaminophen High Swelling hands  Iodine High Anaphylaxis, Swelling Iv dye  Other High Other (See Comments) ALLERGY TO METAL - BLACKENS SKIN AND CAUSES A RASH   Tape High Swelling, Other (See Comments) Skin comes off.  Paper tape is ok tegaderm OK  Peanut Oil Low Other (See Comments) ** Nuts cause runny nose  Shellfish Allergy           Current Medications (11/11/2020):  This is the current hospital active medication list Current Facility-Administered Medications  Medication Dose Route Frequency Provider Last Rate Last Admin   0.9 %  sodium chloride infusion  250 mL Intravenous  Continuous Nance Pear, MD       acetaminophen (TYLENOL) tablet 650 mg  650 mg Oral Q6H PRN Bennie Pierini, MD   650 mg at 11/11/20 0951   albuterol (VENTOLIN HFA) 108 (90 Base) MCG/ACT inhaler 2 puff  2 puff Inhalation Q6H PRN Milus Banister, NP       Chlorhexidine Gluconate Cloth 2 % PADS 6 each  6 each Topical Daily Freddi Starr, MD   6 each at 11/10/20 0500   docusate sodium (COLACE) capsule 100 mg  100 mg Oral BID PRN Freddi Starr, MD       folic acid  (FOLVITE) tablet 1 mg  1 mg Oral Daily Bennie Pierini, MD   1 mg at 11/11/20 0825   gabapentin (NEURONTIN) tablet 600 mg  600 mg Oral TID Bennie Pierini, MD   600 mg at 11/11/20 0824   guaiFENesin (MUCINEX) 12 hr tablet 600 mg  600 mg Oral BID PRN Max Sane, MD       guaiFENesin-dextromethorphan (ROBITUSSIN DM) 100-10 MG/5ML syrup 5 mL  5 mL Oral Q6H Manuella Ghazi, [place]ip[place], MD   5 mL at 11/11/20 0951   metoprolol tartrate (LOPRESSOR) tablet 12.5 mg  12.5 mg Oral BID Max Sane, MD   12.5 mg at 11/11/20 1005   montelukast (SINGULAIR) tablet 10 mg  10 mg Oral QHS Bennie Pierini, MD   10 mg at 11/10/20 2116   pantoprazole (PROTONIX) EC tablet 40 mg  40 mg Oral BID Bennie Pierini, MD   40 mg at 11/11/20 0824   piperacillin-tazobactam (ZOSYN) IVPB 3.375 g  3.375 g Intravenous Q8H Bennie Pierini, MD 12.5 mL/hr at 11/11/20 0629 3.375 g at 11/11/20 0629   polyethylene glycol (MIRALAX / GLYCOLAX) packet 17 g  17 g Oral Daily PRN Freddi Starr, MD       potassium chloride SA (KLOR-CON) CR tablet 40 mEq  40 mEq Oral BID Tawnya Crook, RPH   40 mEq at 11/11/20 2993   predniSONE (DELTASONE) tablet 20 mg  20 mg Oral Q breakfast Bennie Pierini, MD   20 mg at 11/11/20 7169   umeclidinium-vilanterol (ANORO ELLIPTA) 62.5-25 MCG/ACT 1 puff  1 puff Inhalation Daily Milus Banister, NP   1 puff at 11/11/20 0825     Discharge Medications: Please see discharge summary for a list of discharge medications.  Relevant Imaging Results:  Relevant Lab Results:   Additional Information SSN [id number]  Pete Pelt, RN

## 2020-11-11 NOTE — Assessment & Plan Note (Signed)
She is asymptomatic from this.  This was positive on 10/27.  She is not being treated.  Depending on SNF isolation guidelines she may have to stay here longer.  I have ordered recheck of COVID today

## 2020-11-11 NOTE — Assessment & Plan Note (Signed)
On chronic steroids, prednisone 20 mg daily.  Has follow-up with [name] at [facility] neuro

## 2020-11-11 NOTE — Plan of Care (Signed)

## 2020-11-11 NOTE — TOC Progression Note (Signed)
Transition of Care [facility]) - Progression Note    Patient Details  Name: Wendy Sanchez MRN: [id number] Date of Birth: June 20, 1950  Transition of Care [facility]) CM/SW [place], RN Phone Number: 11/11/2020, 11:10 AM  Clinical Narrative:   Patient agreeable to go to SNF and return to [facility].  Tina from [facility] aware, sent FL2.           Expected Discharge Plan and Services                                                 Social Determinants of Health (SDOH) Interventions    Readmission Risk Interventions No flowsheet data found.

## 2020-11-12 DIAGNOSIS — A419 Sepsis, unspecified organism: Secondary | ICD-10-CM | POA: Diagnosis not present

## 2020-11-12 DIAGNOSIS — R000 Tachycardia, unspecified: Secondary | ICD-10-CM

## 2020-11-12 DIAGNOSIS — N179 Acute kidney failure, unspecified: Secondary | ICD-10-CM | POA: Diagnosis not present

## 2020-11-12 DIAGNOSIS — U071 COVID-19: Secondary | ICD-10-CM | POA: Diagnosis not present

## 2020-11-12 DIAGNOSIS — Z933 Colostomy status: Secondary | ICD-10-CM | POA: Diagnosis not present

## 2020-11-12 LAB — BASIC METABOLIC PANEL
Anion gap: 7 (ref 5–15)
BUN: 20 mg/dL (ref 8–23)
CO2: 23 mmol/L (ref 22–32)
Calcium: 8.8 mg/dL — ABNORMAL LOW (ref 8.9–10.3)
Chloride: 109 mmol/L (ref 98–111)
Creatinine, Ser: 1.2 mg/dL — ABNORMAL HIGH (ref 0.44–1.00)
GFR, Estimated: 49 mL/min — ABNORMAL LOW (ref >60)
Glucose, Bld: 86 mg/dL (ref 70–99)
Potassium: 4.6 mmol/L (ref 3.5–5.1)
Sodium: 139 mmol/L (ref 135–145)

## 2020-11-12 LAB — CBC
HCT: 28.1 % — ABNORMAL LOW (ref 36.0–46.0)
Hemoglobin: 8.9 g/dL — ABNORMAL LOW (ref 12.0–15.0)
MCH: 28.3 pg (ref 26.0–34.0)
MCHC: 31.7 g/dL (ref 30.0–36.0)
MCV: 89.2 fL (ref 80.0–100.0)
Platelets: 290 10*3/uL (ref 150–400)
RBC: 3.15 MIL/uL — ABNORMAL LOW (ref 3.87–5.11)
RDW: 17.1 % — ABNORMAL HIGH (ref 11.5–15.5)
WBC: 6.6 10*3/uL (ref 4.0–10.5)
nRBC: 0 % (ref 0.0–0.2)

## 2020-11-12 LAB — SARS CORONAVIRUS 2 (TAT 6-24 HRS): SARS Coronavirus 2: POSITIVE — AB

## 2020-11-12 LAB — GLUCOSE, CAPILLARY: Glucose-Capillary: 119 mg/dL — ABNORMAL HIGH (ref 70–99)

## 2020-11-12 LAB — CORTISOL: Cortisol, Plasma: 10.4 ug/dL

## 2020-11-12 LAB — CULTURE, BLOOD (SINGLE)
Culture: NO GROWTH
Special Requests: ADEQUATE

## 2020-11-12 MED ORDER — METOPROLOL TARTRATE 25 MG PO TABS
12.5000 mg | ORAL_TABLET | Freq: Every day | ORAL | Status: DC
  Administered 2020-11-12 – 2020-11-17 (×4): 12.5 mg via ORAL
  Filled 2020-11-12 (×7): qty 1

## 2020-11-12 MED ORDER — IBUPROFEN 400 MG PO TABS
600.0000 mg | ORAL_TABLET | Freq: Four times a day (QID) | ORAL | Status: DC | PRN
  Administered 2020-11-12 – 2020-11-17 (×8): 600 mg via ORAL
  Filled 2020-11-12 (×8): qty 2

## 2020-11-12 MED ORDER — ENOXAPARIN SODIUM 40 MG/0.4ML IJ SOSY
40.0000 mg | PREFILLED_SYRINGE | INTRAMUSCULAR | Status: DC
  Filled 2020-11-12: qty 0.4

## 2020-11-12 NOTE — Assessment & Plan Note (Signed)
She is asymptomatic from this.  This was positive on 10/27.  She is not being treated.  SNF will require patient to stay here 10 days per current guidelines.  TOC working on placement.  [facility] from where she came

## 2020-11-12 NOTE — Assessment & Plan Note (Signed)
Her PE was diagnosed in 2020 - likely provoked PE in the setting of prolonged immobilization.  No further anticoagulation need.  After reviewing her records in care everywhere and discussion with patient I do not find indication to continue ongoing Eliquis.  Patient is agreeable to stop this.  I have stopped Eliquis. I've D/W onco Dr Janese Banks who is agreeable to f/up in 2-3 weeks as an outpt. LE dopplers negative for DVT. Patient knows the potential risks including new DVT or PE being off anticoagulation.  She is more worried about her bleeding.  Her hemoglobin is 8.9

## 2020-11-12 NOTE — Assessment & Plan Note (Addendum)
Improving with hydration.  Creatinine 1.20.  We will change ibuprofen to as needed basis for now for pain control.  If kidney function worsens - consider stopping ibuprofen

## 2020-11-12 NOTE — Assessment & Plan Note (Signed)
Now resolved with treatment.  Off pressors.  Stop IV Zosyn.  Patient has been afebrile and has no leukocytosis.  No obvious source of infection other than mild stoma infection on admission which seems to have resolved

## 2020-11-12 NOTE — Assessment & Plan Note (Signed)
When she does activity/ambulates.  Metoprolol 12.5 mg p.o. daily for now as blood pressure can tolerate

## 2020-11-12 NOTE — TOC Progression Note (Signed)
Transition of Care [facility]) - Progression Note    Patient Details  Name: Wendy Sanchez MRN: [id number] Date of Birth: [birth year]  Transition of Care [facility]) CM/SW [place], RN Phone Number: 11/12/2020, 9:34 AM  Clinical Narrative:      Reached out to Otila Kluver at [facility] to inquire if they will offer a bed, awaiting a responce      Expected Discharge Plan and Services                                                 Social Determinants of Health (SDOH) Interventions    Readmission Risk Interventions No flowsheet data found.

## 2020-11-12 NOTE — Assessment & Plan Note (Signed)
recent perforated sigmoid diverticulitis status post open sigmoidectomy and colostomy placement at [facility] on 09/26/2020.

## 2020-11-12 NOTE — Assessment & Plan Note (Signed)
Counseled on weight loss.  This complicates her overall prognosis.

## 2020-11-12 NOTE — Progress Notes (Signed)
   11/12/20 1642  Assess: MEWS Score  Temp 100 F (37.8 C)  BP 109/65  Pulse Rate (!) 116  Resp 16  SpO2 97 %  O2 Device Room Air  Assess: MEWS Score  MEWS Temp 0  MEWS Systolic 0  MEWS Pulse 2  MEWS RR 0  MEWS LOC 0  MEWS Score 2  MEWS Score Color Yellow  Assess: if the MEWS score is Yellow or Red  Were vital signs taken at a resting state? Yes  Focused Assessment Change from prior assessment (see assessment flowsheet)  Does the patient meet 2 or more of the SIRS criteria? No  MEWS guidelines implemented *See Row Information* Yes  Treat  MEWS Interventions Administered scheduled meds/treatments  Escalate  MEWS: Escalate Yellow: discuss with charge nurse/RN and consider discussing with provider and RRT  Notify: Charge Nurse/RN  Name of Charge Nurse/RN Notified Helene Kelp, RN  Date Charge Nurse/RN Notified 11/12/20  Time Charge Nurse/RN Notified 1648  Notify: Provider  Provider Name/Title Manuella Ghazi  Date Provider Notified 11/12/20  Time Provider Notified [phone]  Notification Type Page (secure chat)  Notification Reason Change in status (HR 116)  Provider response No new orders  Date of Provider Response 11/12/20  Time of Provider Response 1649  Document  Patient Outcome Other (Comment) (pt asymptomatic)  Assess: SIRS CRITERIA  SIRS Temperature  0  SIRS Pulse 1  SIRS Respirations  0  SIRS WBC 0  SIRS Score Sum  1

## 2020-11-12 NOTE — Progress Notes (Signed)
Progress Note    Wendy Sanchez   IDP:[id number]  DOB: Apr 14, 1950  DOA: 11/07/2020     5 Date of Service: 11/12/2020   Clinical Course  70 year old woman, former smoker with COPD, nonischemic cardiomyopathy EF 50%, peripheral vascular disease, DVT/PE on Eliquis with recent perforated sigmoid diverticulitis status post open sigmoidectomy and colostomy placement at [facility] on 09/26/2020 who comes to [facility] ED with weakness and low blood pressure from her rehab facility.  Admitted for septic shock  10/27: admitted to ICU requiring pressors and fluid resuscitation 10/28: Weaned off pressors 10/29: TRH picked up.  Transition back to Eliquis from heparin.  Transfer to floor 10/30: Patient agreeable to stop Eliquis.  Feeling weak, PT, OT consult, lower extremity Dopplers negative for DVT 10/31: May have to wait for SNF due to COVID isolation guidelines.  COVID incidental positive on 10/27.  Sinus tach started on low-dose metoprolol 11/1: Hypotensive, checking random cortisol and reduced dose of metoprolol to once daily   Assessment and Plan * Septic shock ([place]) Now resolved with treatment.  Off pressors.  Stop IV Zosyn.  Patient has been afebrile and has no leukocytosis.  No obvious source of infection other than mild stoma infection on admission which seems to have resolved  Sinus tachycardia When she does activity/ambulates.  Metoprolol 12.5 mg p.o. daily for now as blood pressure can tolerate  COVID-19 virus infection She is asymptomatic from this.  This was positive on 10/27.  She is not being treated.  SNF will require patient to stay here 10 days per current guidelines.  TOC working on placement.  [facility] from [facility]  AKI (acute kidney injury) ([place]) Improving with hydration.  Creatinine 1.20.  We will change ibuprofen to as needed basis for now for pain control.  If kidney function worsens - consider stopping ibuprofen  Colostomy in place  [facility]) recent perforated sigmoid diverticulitis status post open sigmoidectomy and colostomy placement at [facility] on 09/26/2020.  Temporal arteritis ([facility]) On chronic steroids, prednisone 20 mg daily.  Has follow-up with [name] at [facility] neuro  Pulmonary embolism [facility]) Her PE was diagnosed in 2020 - likely provoked PE in the setting of prolonged immobilization.  No further anticoagulation need.  After reviewing her records in care everywhere and discussion with patient I do not find indication to continue ongoing Eliquis.  Patient is agreeable to stop this.  I have stopped Eliquis. I've D/W onco Dr Janese Banks who is agreeable to f/up in 2-3 weeks as an outpt. LE dopplers negative for DVT. Patient knows the potential risks including new DVT or PE being off anticoagulation.  She is more worried about her bleeding.  Her hemoglobin is 8.9  Morbid obesity with BMI of 45.0-49.9, adult ([place]) Counseled on weight loss.  This complicates her overall prognosis.    Subjective:  Denies any new symptoms.  Hypotensive  Objective Vitals:   11/11/20 2152 11/12/20 0528 11/12/20 0833 11/12/20 1153  BP: 107/66 101/72 (!) 95/57 (!) 91/59  Pulse: 90 84 88 100  Resp: 20 16 16 16   Temp: 98.1 F (36.7 C) 98.2 F (36.8 C) 98.7 F (37.1 C) 98.6 F (37 C)  TempSrc: Oral Oral    SpO2: 100% 100% 98% 100%  Weight:  (!) 142 kg    Height:       (!) 142 kg  Vital signs were reviewed and unremarkable except for: Blood pressure: Low    Exam Physical  Exam   General:awake, alert, conversant HENT:moist OMM, pupils equal and reactive Lungs:CTA b/l, no W/C/R Cardiovascular:S1-S2 normal, no M/R/G Abdomen:midline incision, ostomy in LLQ with liquid stool Extremities:1+ pitting edema of BLE Neuro:A/Ox3 [id number]  Labs / Other Information My review of labs, imaging, notes and other tests is significant for Creatinine 1.2, hemoglobin 8.9     Disposition Plan: Status is: Inpatient  Remains inpatient  appropriate because: Hypotensive, waiting for SNF placement due to COVID isolation she may need to stay here total 10 days since last positive COVID on 10/27        Time spent: 35 minutes [facility] 11/12/2020, 3:23 PM

## 2020-11-12 NOTE — Progress Notes (Signed)
Pts BP 95/57 MD notified Per MD hold morning dose Metoprolol

## 2020-11-12 NOTE — Assessment & Plan Note (Signed)
On chronic steroids, prednisone 20 mg daily.  Has follow-up with [name] at [facility] neuro

## 2020-11-13 DIAGNOSIS — A419 Sepsis, unspecified organism: Secondary | ICD-10-CM | POA: Diagnosis not present

## 2020-11-13 DIAGNOSIS — R6521 Severe sepsis with septic shock: Secondary | ICD-10-CM | POA: Diagnosis not present

## 2020-11-13 LAB — CBC
HCT: 28.2 % — ABNORMAL LOW (ref 36.0–46.0)
Hemoglobin: 8.9 g/dL — ABNORMAL LOW (ref 12.0–15.0)
MCH: 28 pg (ref 26.0–34.0)
MCHC: 31.6 g/dL (ref 30.0–36.0)
MCV: 88.7 fL (ref 80.0–100.0)
Platelets: 318 10*3/uL (ref 150–400)
RBC: 3.18 MIL/uL — ABNORMAL LOW (ref 3.87–5.11)
RDW: 17 % — ABNORMAL HIGH (ref 11.5–15.5)
WBC: 13.6 10*3/uL — ABNORMAL HIGH (ref 4.0–10.5)
nRBC: 0 % (ref 0.0–0.2)

## 2020-11-13 LAB — BASIC METABOLIC PANEL
Anion gap: 4 — ABNORMAL LOW (ref 5–15)
BUN: 19 mg/dL (ref 8–23)
CO2: 22 mmol/L (ref 22–32)
Calcium: 8.6 mg/dL — ABNORMAL LOW (ref 8.9–10.3)
Chloride: 114 mmol/L — ABNORMAL HIGH (ref 98–111)
Creatinine, Ser: 0.93 mg/dL (ref 0.44–1.00)
GFR, Estimated: >60 mL/min (ref >60)
Glucose, Bld: 104 mg/dL — ABNORMAL HIGH (ref 70–99)
Potassium: 5.2 mmol/L — ABNORMAL HIGH (ref 3.5–5.1)
Sodium: 140 mmol/L (ref 135–145)

## 2020-11-13 LAB — POTASSIUM: Potassium: 4.4 mmol/L (ref 3.5–5.1)

## 2020-11-13 MED ORDER — ENOXAPARIN SODIUM 80 MG/0.8ML IJ SOSY
0.5000 mg/kg | PREFILLED_SYRINGE | INTRAMUSCULAR | Status: DC
  Administered 2020-11-13 – 2020-11-16 (×4): 70 mg via SUBCUTANEOUS
  Filled 2020-11-13 (×5): qty 0.7

## 2020-11-13 NOTE — TOC Progression Note (Signed)
Transition of Care [facility]) - Progression Note    Patient Details  Name: KASSADY LABOY MRN: [id number] Date of Birth: [birth year]  Transition of Care [facility]) CM/SW [place], RN Phone Number: 11/13/2020, 12:28 PM  Clinical Narrative:   Damaris Schooner to [facility] Kluver at [facility]; patient has 5 days of SNF left, then patient would have to pay $188 per day.  Message relayed to patient.  Patient tested positive on 10/27 for COVID, if patient elects to return to facility, can return to facility 11/8.  TOC to follow to discharge.         Expected Discharge Plan and Services                                                 Social Determinants of Health (SDOH) Interventions    Readmission Risk Interventions No flowsheet data found.

## 2020-11-13 NOTE — Progress Notes (Signed)
Physical Therapy Treatment Patient Details Name: Wendy Sanchez MRN: [id number] DOB: Feb 02, 1950 Today's Date: 11/13/2020   History of Present Illness Pt is a 70 y.o. Ffemale here with weakness & low BP and admitted for septic shock. PMH includes COPD, nonischemic cardiomyopathy EF 50%, peripheral vascular disease, DVT/PE on Eliquis with recent perforated sigmoid diverticulitis s/p sigmoidectomy and colostomy placement at [facility](09/26/2020).    PT Comments    Pt very motivated to work with PT and did quite well being able to rise to standing multiple times w/o assist, ambulate ~40 ft X 2 and showing great effort with supine LE strengthening exercises.  Pt quite fatigued after the second bout of ambulation but ultimately this was much more than she has been able to do t/o this entire hospitalization.     Recommendations for follow up therapy are one component of a multi-disciplinary discharge planning process, led by the attending physician.  Recommendations may be updated based on patient status, additional functional criteria and insurance authorization.  Follow Up Recommendations  Skilled nursing-short term rehab (<3 hours/day)     Assistance Recommended at Discharge Intermittent Supervision/Assistance  Equipment Recommendations  Other (comment) (TBD a next venue)    Recommendations for Other Services       Precautions / Restrictions Precautions Precautions: Fall Precaution Comments: colostomy, midline incision Restrictions Weight Bearing Restrictions: No     Mobility  Bed Mobility Overal bed mobility: Needs Assistance Bed Mobility: Sit to Supine     Supine to sit: Min guard;HOB elevated Sit to supine: Mod assist;Min assist   General bed mobility comments: Pt was able to get R LE up into bed, but needed assist with L.  Able to use rails to scoot up in bed once assisted to appropriate position    Transfers Overall transfer level: Needs assistance Equipment used:  Rolling walker (2 wheels) Transfers: Sit to/from Stand Sit to Stand: Min guard;Min assist           General transfer comment: 3 x sit to stand this session.  Needing a few rocking attempts to rise but on 2 of 3 bouts she did not need any direct assist apart from set up and sequencing cues    Ambulation/Gait Ambulation/Gait assistance: Min guard Gait Distance (Feet): 40 Feet Assistive device: Rolling walker (2 wheels)       General Gait Details: 2 bouts of 40 ft with BRW on room air.  This is considerably more than she had been able to do on this hospitalization, and though she clearly did have some fatigue her O2 stayed in the high 90s (HR did briefly spike to 140 at the end of the second effort but quickly recovered).  She was reliant on the walker and displayed slow gait with some guarding but had no overt safety issues and was both surprised and pleased with what she was able to do.   Stairs             Wheelchair Mobility    Modified Rankin (Stroke Patients Only)       Balance Overall balance assessment: Needs assistance Sitting-balance support: Feet supported;No upper extremity supported Sitting balance-Leahy Scale: Good Sitting balance - Comments: good static sitting balance within BOS   Standing balance support: Bilateral upper extremity supported Standing balance-Leahy Scale: Fair Standing balance comment: Pt able to maintain standing balance for 45 sec while engaging in standing grooming tasks and frontal peri-care  Cognition Arousal/Alertness: Awake/alert Behavior During Therapy: WFL for tasks assessed/performed Overall Cognitive Status: Within Functional Limits for tasks assessed                                 General Comments: Oriented x 4, very motivated, pleasant, able to follow multi-step instructions        Exercises General Exercises - Upper Extremity Chair Push Up: Strengthening;Both;10  reps;Seated General Exercises - Lower Extremity Ankle Circles/Pumps: AROM;10 reps Short Arc Quad: Strengthening;10 reps Long Arc Quad: AROM;Both;10 reps;Seated Heel Slides: Strengthening;10 reps (with resisted leg ext) Hip ABduction/ADduction: Strengthening;10 reps Hip Flexion/Marching: AROM;Both;20 reps;Seated    General Comments General comments (skin integrity, edema, etc.): Resting HR 100s. Following bed mobility and transfer to recliner HR 120s. While performing standing ADLs, HR MAX 140. HR returning to resting HR of 104 bpm once seated in recliner for 2 mins. RN informed of elevated HR during functional mobility      Pertinent Vitals/Pain Pain Location: minimal LE soreness with activity    Home Living                          Prior Function            PT Goals (current goals can now be found in the care plan section) Progress towards PT goals: Progressing toward goals    Frequency    Min 2X/week      PT Plan Current plan remains appropriate    Co-evaluation              AM-PAC PT "6 Clicks" Mobility   Outcome Measure  Help needed turning from your back to your side while in a flat bed without using bedrails?: None Help needed moving from lying on your back to sitting on the side of a flat bed without using bedrails?: A Little Help needed moving to and from a bed to a chair (including a wheelchair)?: A Little Help needed standing up from a chair using your arms (e.g., wheelchair or bedside chair)?: A Little Help needed to walk in hospital room?: A Little Help needed climbing 3-5 steps with a railing? : Total 6 Click Score: 17    End of Session Equipment Utilized During Treatment: Gait belt Activity Tolerance: Patient tolerated treatment well Patient left: in bed;with call bell/phone within reach;with nursing/sitter in room;with bed alarm set Nurse Communication: Mobility status PT Visit Diagnosis: Other abnormalities of gait and mobility  (R26.89);Muscle weakness (generalized) (M62.81)     Time: 3435-6861 PT Time Calculation (min) (ACUTE ONLY): 32 min  Charges:  $Gait Training: 8-22 mins $Therapeutic Exercise: 8-22 mins                     Kreg Shropshire, DPT 11/13/2020, 5:42 PM

## 2020-11-13 NOTE — Progress Notes (Signed)
PROGRESS NOTE    Wendy Sanchez  TIR:[id number] DOB: 01-15-1950 DOA: 11/07/2020 PCP: Kristie Cowman, MD   Brief Narrative:  This 70 year old female , former smoker with COPD, nonischemic cardiomyopathy LVEF 50%, peripheral vascular disease, DVT/PE on Eliquis with recent perforated sigmoid diverticulitis status post open sigmoidectomy and colostomy placement at [facility] on 09/26/2020 who presented to [facility] ED with generalized weakness and low blood pressure from her rehab facility.   She is admitted for septic shock requiring presser support.   Significant events: 10/27: Admitted to ICU requiring pressors and fluid resuscitation. 10/28: Weaned off pressors 10/29: TRH picked up.  Transition back to Eliquis from heparin.  Transfer to floor 10/30: Patient agreeable to stop Eliquis.  Feeling weak, PT, OT consult, lower extremity Dopplers negative for DVT.  Sinus tach started on low-dose metoprolol 10/31: May have to wait for SNF due to COVID isolation guidelines.  COVID incidental positive on  11/1: Hypotensive, checking random cortisol and reduced dose of metoprolol to once daily   Assessment & Plan:   Principal Problem:   Septic shock ([place]) Active Problems:   Morbid obesity with BMI of 45.0-49.9, adult ([place])   Pulmonary embolism (H[facility])   Temporal arteritis (HCC)   Colostomy in place [facility])   AKI (acute kidney injury) ([place])   COVID-19 virus infection   Sinus tachycardia   Septic shock sec. to possible stoma infection: > Resolved. Now resolved with treatment.  Patient is off pressors. Patient remained afebrile, no leukocytosis. IV Zosyn discontinued. No obvious source of infection other than mild stoma infection on admission seems to have resolved.  Sinus tachycardia: Heart rate goes up when patient does activity or ambulates. Continue metoprolol 12.5 mg daily.  COVID-19 infection: She is asymptomatic.  COVID+ on 10/27. She is not hypoxic,  not requiring any treatment. SNF  will require patient to stay here 10 days per current guidelines. TOC working on placement, [facility].  Acute kidney injury: > Resolved. Improved with IV hydration.  Serum creatinine 0.93. Avoid nephrotoxic medications.  Colostomy in place: Recent perforated sigmoid diverticulitis s/p open sigmoidectomy and colostomy placement at [facility] on 09/26/2020.  Temporal arteritis: Continue prednisone 20 mg daily. Patient has follow-up with [name] @ [facility] neurology.  Pulmonary embolism. PE was diagnosed in 2020, likely provoked in the setting of prolonged immobilization. No further anticoagulation needed.  Patient is agreeable to stop this. It was discussed with [name] who is agreeable to follow-up in 2 to 3 weeks as an outpatient. Lower extremity Doppler negative for DVT. Hemoglobin remained stable.  Morbid obesity Counseled on weight loss.:  DVT prophylaxis: SCDs Code Status: Full code. Family Communication: No family at bed side. Disposition Plan:    Status is: Inpatient  Remains inpatient appropriate because:   Awaiting SNF placement due to COVID isolation. She may need to stay here total 10 days since last positive COVID on 10/27.   Consultants:  None.  Procedures: None  Antimicrobials:   Anti-infectives (From admission, onward)    Start     Dose/Rate Route Frequency Ordered Stop   11/08/20 1400  piperacillin-tazobactam (ZOSYN) IVPB 3.375 g  Status:  Discontinued        3.375 g 12.5 mL/hr over 240 Minutes Intravenous Every 8 hours 11/08/20 1027 11/12/20 0907   11/07/20 2130  metroNIDAZOLE (FLAGYL) IVPB 500 mg        500 mg 100 mL/hr over 60 Minutes Intravenous  Once 11/07/20 2120 11/08/20  0112   11/07/20 2000  vancomycin (VANCOCIN) IVPB 1000 mg/200 mL premix        1,000 mg 200 mL/hr over 60 Minutes Intravenous  Once 11/07/20 1951 11/07/20 2216   11/07/20 2000  ceFEPIme (MAXIPIME) 2 g in sodium chloride 0.9 % 100 mL IVPB        2 g 200  mL/hr over 30 Minutes Intravenous  Once 11/07/20 1951 11/07/20 2046        Subjective: Patient was seen and examined at bedside.  Overnight events noted. Patient looks better, denies any chest pain, shortness of breath, dizziness.  Objective: Vitals:   11/12/20 1954 11/13/20 0045 11/13/20 0500 11/13/20 0830  BP: 105/66 104/65 101/70 104/65  Pulse: (!) 105 100 85 84  Resp: 18  17 18   Temp: 98.3 F (36.8 C) 98.5 F (36.9 C) 98.1 F (36.7 C) 98.1 F (36.7 C)  TempSrc: Oral Oral Oral Oral  SpO2: 99% 98% 99% 100%  Weight:      Height:        Intake/Output Summary (Last 24 hours) at 11/13/2020 1134 Last data filed at 11/13/2020 0831 Gross per 24 hour  Intake --  Output 2600 ml  Net -2600 ml   Filed Weights   11/09/20 0500 11/11/20 0500 11/12/20 0528  Weight: (!) 143 kg (!) 142.4 kg (!) 142 kg    Examination:  General exam: Appears comfortable, not in any acute distress.  Deconditioned Respiratory system: Clear to auscultation. Respiratory effort normal.  RR 15 Cardiovascular system: S1-S2 heard, regular rate and rhythm, no murmur. Gastrointestinal system: Abdomen is soft, nontender, nondistended, midline surgical scar +,  colostomy on the left side with brown-colored stool noted.   Central nervous system: Alert and oriented X 3. No focal neurological deficits. Extremities: No edema, no cyanosis, no clubbing. Skin: No rashes, lesions or ulcers Psychiatry: Judgement and insight appear normal. Mood & affect appropriate.     Data Reviewed: I have personally reviewed following labs and imaging studies  CBC: Recent Labs  Lab 11/07/20 1905 11/08/20 0209 11/09/20 0555 11/11/20 0559 11/12/20 0422 11/13/20 0453  WBC 10.7* 10.4 5.8 7.5 6.6 13.6*  NEUTROABS 9.1*  --   --   --   --   --   HGB 8.8* 8.9* 8.4* 8.6* 8.9* 8.9*  HCT 28.1* 27.9* 27.1* 27.2* 28.1* 28.2*  MCV 88.4 88.9 87.7 89.2 89.2 88.7  PLT 289 282 284 294 290 580   Basic Metabolic Panel: Recent Labs  Lab  11/08/20 0209 11/09/20 0555 11/10/20 0504 11/11/20 0559 11/12/20 0422 11/13/20 0453 11/13/20 0805  NA 137 137 139 141 139 140  --   K 3.7 4.1 4.6 4.7 4.6 5.2* 4.4  CL 100 101 107 108 109 114*  --   CO2 29 29 24 28 23 22   --   GLUCOSE 110* 114* 101* 89 86 104*  --   BUN 29* 21 20 19 20 19   --   CREATININE 1.39* 1.08* 0.96 1.06* 1.20* 0.93  --   CALCIUM 8.3* 8.9 9.0 8.8* 8.8* 8.6*  --   MG 2.0 2.3 2.5*  --   --   --   --   PHOS 3.9 4.2 4.2  --   --   --   --    GFR: Estimated Creatinine Clearance: 84.5 mL/min (by C-G formula based on SCr of 0.93 mg/dL). Liver Function Tests: Recent Labs  Lab 11/07/20 1905  AST 18  ALT 13  ALKPHOS 54  BILITOT 0.5  PROT 5.7*  ALBUMIN 2.3*   Recent Labs  Lab 11/08/20 0209  LIPASE 44   No results for input(s): AMMONIA in the last 168 hours. Coagulation Profile: Recent Labs  Lab 11/08/20 0221  INR 1.3*   Cardiac Enzymes: No results for input(s): CKTOTAL, CKMB, CKMBINDEX, TROPONINI in the last 168 hours. BNP (last 3 results) No results for input(s): PROBNP in the last 8760 hours. HbA1C: No results for input(s): HGBA1C in the last 72 hours. CBG: Recent Labs  Lab 11/07/20 1905 11/08/20 0142 11/12/20 2133  GLUCAP 129* 114* 119*   Lipid Profile: No results for input(s): CHOL, HDL, LDLCALC, TRIG, CHOLHDL, LDLDIRECT in the last 72 hours. Thyroid Function Tests: No results for input(s): TSH, T4TOTAL, FREET4, T3FREE, THYROIDAB in the last 72 hours. Anemia Panel: No results for input(s): VITAMINB12, FOLATE, FERRITIN, TIBC, IRON, RETICCTPCT in the last 72 hours. Sepsis Labs: Recent Labs  Lab 11/08/20 0209 11/08/20 1046 11/08/20 1256 11/08/20 1648 11/08/20 2040  PROCALCITON 0.16  --   --   --   --   LATICACIDVEN  --  3.6* 1.4 1.7 1.6    Recent Results (from the past 240 hour(s))  Resp Panel by RT-PCR (Flu A&B, Covid)     Status: Abnormal   Collection Time: 11/07/20 12:24 PM   Specimen: Nasopharyngeal(NP) swabs in vial  transport medium  Result Value Ref Range Status   SARS Coronavirus 2 by RT PCR POSITIVE (A) NEGATIVE Final    Comment: RESULT CALLED TO, READ BACK BY AND VERIFIED WITH: NIKKIA VONGH 11/08/20 1357 MU (NOTE) SARS-CoV-2 target nucleic acids are DETECTED.  The SARS-CoV-2 RNA is generally detectable in upper respiratory specimens during the acute phase of infection. Positive results are indicative of the presence of the identified virus, but do not rule out bacterial infection or co-infection with other pathogens not detected by the test. Clinical correlation with patient history and other diagnostic information is necessary to determine patient infection status. The expected result is Negative.  Fact Sheet for Patients: EntrepreneurPulse.com.au  Fact Sheet for Healthcare Providers: IncredibleEmployment.be  This test is not yet approved or cleared by the [place] FDA and  has been authorized for detection and/or diagnosis of SARS-CoV-2 by FDA under an Emergency Use Authorization (EUA).  This EUA will remain in effect (meaning this test can be use d) for the duration of  the COVID-19 declaration under Section 564(b)(1) of the Act, 21 U.S.C. section 360bbb-3(b)(1), unless the authorization is terminated or revoked sooner.     Influenza A by PCR NEGATIVE NEGATIVE Final   Influenza B by PCR NEGATIVE NEGATIVE Final    Comment: (NOTE) The Xpert Xpress SARS-CoV-2/FLU/RSV plus assay is intended as an aid in the diagnosis of influenza from Nasopharyngeal swab specimens and should not be used as a sole basis for treatment. Nasal washings and aspirates are unacceptable for Xpert Xpress SARS-CoV-2/FLU/RSV testing.  Fact Sheet for Patients: EntrepreneurPulse.com.au  Fact Sheet for Healthcare Providers: IncredibleEmployment.be  This test is not yet approved or cleared by the [place] FDA and has been  authorized for detection and/or diagnosis of SARS-CoV-2 by FDA under an Emergency Use Authorization (EUA). This EUA will remain in effect (meaning this test can be used) for the duration of the COVID-19 declaration under Section 564(b)(1) of the Act, 21 U.S.C. section 360bbb-3(b)(1), unless the authorization is terminated or revoked.  Performed at [facility], [address] [place], [place] [address]   Blood culture (routine single)     Status: None  Collection Time: 11/07/20  7:12 PM   Specimen: BLOOD  Result Value Ref Range Status   Specimen Description BLOOD LEFT ANTECUBITAL  Final   Special Requests   Final    BOTTLES DRAWN AEROBIC AND ANAEROBIC Blood Culture adequate volume   Culture   Final    NO GROWTH 5 DAYS Performed at [facility], [address] [place], [place] [address]    Report Status 11/12/2020 FINAL  Final  Urine Culture     Status: None   Collection Time: 11/07/20 11:26 PM   Specimen: Urine, Random  Result Value Ref Range Status   Specimen Description   Final    URINE, RANDOM Performed at [facility], [address], [place], [place] [address]    Special Requests   Final    NONE Performed at [facility], [address], [place], [place] [address]    Culture   Final    NO GROWTH Performed at [facility], [place] [address] [place], [place] [address]    Report Status 11/08/2020 FINAL  Final  C Difficile Quick Screen w PCR reflex     Status: None   Collection Time: 11/08/20 12:16 AM   Specimen: STOOL  Result Value Ref Range Status   C Diff antigen NEGATIVE NEGATIVE Final   C Diff toxin NEGATIVE NEGATIVE Final   C Diff interpretation No C. difficile detected.  Final    Comment: Performed at [facility], [address] [place], [place] [address]  MRSA Next Gen by PCR, Nasal     Status: None   Collection Time: 11/08/20  1:35 AM   Specimen: Nasal Mucosa; Nasal Swab  Result Value Ref Range  Status   MRSA by PCR Next Gen NOT DETECTED NOT DETECTED Final    Comment: (NOTE) The GeneXpert MRSA Assay (FDA approved for NASAL specimens only), is one component of a comprehensive MRSA colonization surveillance program. It is not intended to diagnose MRSA infection nor to guide or monitor treatment for MRSA infections. Test performance is not FDA approved in patients less than 52 years old. Performed at [facility], [address] [place], [place] [address]   Gastrointestinal Panel by PCR , Stool     Status: None   Collection Time: 11/08/20  9:55 AM   Specimen: Stool  Result Value Ref Range Status   Campylobacter species NOT DETECTED NOT DETECTED Final   Plesimonas shigelloides NOT DETECTED NOT DETECTED Final   Salmonella species NOT DETECTED NOT DETECTED Final   Yersinia enterocolitica NOT DETECTED NOT DETECTED Final   Vibrio species NOT DETECTED NOT DETECTED Final   Vibrio cholerae NOT DETECTED NOT DETECTED Final   Enteroaggregative E coli (EAEC) NOT DETECTED NOT DETECTED Final   Enteropathogenic E coli (EPEC) NOT DETECTED NOT DETECTED Final   Enterotoxigenic E coli (ETEC) NOT DETECTED NOT DETECTED Final   Shiga like toxin producing E coli (STEC) NOT DETECTED NOT DETECTED Final   Shigella/Enteroinvasive E coli (EIEC) NOT DETECTED NOT DETECTED Final   Cryptosporidium NOT DETECTED NOT DETECTED Final   Cyclospora cayetanensis NOT DETECTED NOT DETECTED Final   Entamoeba histolytica NOT DETECTED NOT DETECTED Final   Giardia lamblia NOT DETECTED NOT DETECTED Final   Adenovirus F40/41 NOT DETECTED NOT DETECTED Final   Astrovirus NOT DETECTED NOT DETECTED Final   Norovirus GI/GII NOT DETECTED NOT DETECTED Final   Rotavirus A NOT DETECTED NOT DETECTED Final   Sapovirus (I, II, IV, and V) NOT DETECTED NOT  DETECTED Final    Comment: Performed at [facility], [place], [place] [address]  Respiratory (~20 pathogens) panel by PCR     Status: None    Collection Time: 11/08/20 12:24 PM   Specimen: Nasopharyngeal Swab; Respiratory  Result Value Ref Range Status   Adenovirus NOT DETECTED NOT DETECTED Final   Coronavirus 229E NOT DETECTED NOT DETECTED Final    Comment: (NOTE) The Coronavirus on the Respiratory Panel, DOES NOT test for the novel  Coronavirus (2019 nCoV)    Coronavirus HKU1 NOT DETECTED NOT DETECTED Final   Coronavirus NL63 NOT DETECTED NOT DETECTED Final   Coronavirus OC43 NOT DETECTED NOT DETECTED Final   Metapneumovirus NOT DETECTED NOT DETECTED Final   Rhinovirus / Enterovirus NOT DETECTED NOT DETECTED Final   Influenza A NOT DETECTED NOT DETECTED Final   Influenza B NOT DETECTED NOT DETECTED Final   Parainfluenza Virus 1 NOT DETECTED NOT DETECTED Final   Parainfluenza Virus 2 NOT DETECTED NOT DETECTED Final   Parainfluenza Virus 3 NOT DETECTED NOT DETECTED Final   Parainfluenza Virus 4 NOT DETECTED NOT DETECTED Final   Respiratory Syncytial Virus NOT DETECTED NOT DETECTED Final   Bordetella pertussis NOT DETECTED NOT DETECTED Final   Bordetella Parapertussis NOT DETECTED NOT DETECTED Final   Chlamydophila pneumoniae NOT DETECTED NOT DETECTED Final   Mycoplasma pneumoniae NOT DETECTED NOT DETECTED Final    Comment: Performed at [facility], [place], [place], [place] [address]  SARS CORONAVIRUS 2 (TAT 6-24 HRS) Nasopharyngeal Nasopharyngeal Swab     Status: Abnormal   Collection Time: 11/11/20 12:23 PM   Specimen: Nasopharyngeal Swab  Result Value Ref Range Status   SARS Coronavirus 2 POSITIVE (A) NEGATIVE Final    Comment: (NOTE) SARS-CoV-2 target nucleic acids are DETECTED.  The SARS-CoV-2 RNA is generally detectable in upper and lower respiratory specimens during the acute phase of infection. Positive results are indicative of the presence of SARS-CoV-2 RNA. Clinical correlation with patient history and other diagnostic information is  necessary to determine patient infection status. Positive  results do not rule out bacterial infection or co-infection with other viruses.  The expected result is Negative.  Fact Sheet for Patients: SugarRoll.be  Fact Sheet for Healthcare Providers: https://www.woods-mathews.com/  This test is not yet approved or cleared by the [place] FDA and  has been authorized for detection and/or diagnosis of SARS-CoV-2 by FDA under an Emergency Use Authorization (EUA). This EUA will remain  in effect (meaning this test can be used) for the duration of the COVID-19 declaration under Section 564(b)(1) of the Act, 21 U. S.C. section 360bbb-3(b)(1), unless the authorization is terminated or revoked sooner.   Performed at [facility], [place] [address], [place], [place] [address]     Radiology Studies: No results found.  Scheduled Meds:  Chlorhexidine Gluconate Cloth  6 each Topical Daily   enoxaparin (LOVENOX) injection  40 mg Subcutaneous Z02H   folic acid  1 mg Oral Daily   gabapentin  600 mg Oral TID   guaiFENesin-dextromethorphan  5 mL Oral Q6H   metoprolol tartrate  12.5 mg Oral Daily   montelukast  10 mg Oral QHS   pantoprazole  40 mg Oral BID   predniSONE  20 mg Oral Q breakfast   umeclidinium-vilanterol  1 puff Inhalation Daily   Continuous Infusions:  sodium chloride       LOS: 6 days    Time spent: 35 mins  Shawna Clamp, MD [facility]   If 7PM-7AM, please contact night-coverage

## 2020-11-13 NOTE — Progress Notes (Signed)
PHARMACIST - PHYSICIAN COMMUNICATION  CONCERNING:  Enoxaparin (Lovenox) for DVT Prophylaxis   DESCRIPTION: Patient was prescribed enoxaprin 40mg  q24 hours for VTE prophylaxis.   Filed Weights   11/09/20 0500 11/11/20 0500 11/12/20 0528  Weight: (!) 143 kg (315 lb 4.1 oz) (!) 142.4 kg (313 lb 15 oz) (!) 142 kg (313 lb 0.9 oz)    Body mass index is 49.03 kg/m.  Estimated Creatinine Clearance: 84.5 mL/min (by C-G formula based on SCr of 0.93 mg/dL).   Based on [place] patient is candidate for enoxaparin 0.5mg /kg TBW SQ every 24 hours based on BMI being >30.  RECOMMENDATION: Pharmacy has adjusted enoxaparin dose per [facility] policy.  Patient is now receiving enoxaparin 70 mg every 24 hours    Darnelle Bos, PharmD Clinical Pharmacist  11/13/2020 2:45 PM

## 2020-11-13 NOTE — Progress Notes (Signed)
Occupational Therapy Treatment Patient Details Name: Wendy Sanchez MRN: [id number] DOB: 20-Mar-1950 Today's Date: 11/13/2020   History of present illness Pt is a 70 y.o. F arriving to ED due to weakness & low BP and admitted for septic shock. PMH includes COPD, nonischemic cardiomyopathy EF 50%, peripheral vascular disease, DVT/PE on Eliquis with recent perforated sigmoid diverticulitis s/p sigmoidectomy and colostomy placement at [facility](09/26/2020).   OT comments  Pt seen for OT treatment on this date. Upon arrival to room, pt awake and seated upright in bed. Pt agreeable to OT tx. Pt currently presents with decreased activity tolerance and strength. Due to these functional impairments, pt requires MIN GUARD for bed mobility (with HOB elevated), MOD A for sit<>stand transfers, and MIN GUARD for standing grooming tasks. This date, pt able to walk 52ft forward & 16ft backward, and maintain standing balance for 45 sec while engaging in standing grooming tasks and frontal peri-care before becoming fatigued and requiring seated rest break (with HR MAX 140). Pt subsequently engaged in seated UE/LE therapy exercises to promote strength for improved tolerance during standing ADLs/functional mobility. At end of session, pt left seated in recliner, with all needs within reach and HR returning to rest HR of 104bpm. RN informed of vitals during session. Pt is making good progress toward goals and continues to benefit from skilled OT services to maximize return to PLOF and minimize risk of future falls, injury, caregiver burden, and readmission. Will continue to follow POC. Discharge recommendation remains appropriate.     Recommendations for follow up therapy are one component of a multi-disciplinary discharge planning process, led by the attending physician.  Recommendations may be updated based on patient status, additional functional criteria and insurance authorization.    Follow Up Recommendations   Skilled nursing-short term rehab (<3 hours/day)    Assistance Recommended at Discharge Intermittent Supervision/Assistance  Equipment Recommendations  Other (comment) (defer to next venue of care)       Precautions / Restrictions Precautions Precautions: Fall Precaution Comments: colostomy, midline incision Restrictions Weight Bearing Restrictions: No       Mobility Bed Mobility Overal bed mobility: Needs Assistance Bed Mobility: Supine to Sit     Supine to sit: Min guard;HOB elevated     General bed mobility comments: With increased time/effort and use of bedrails, able to perform without physical assist    Transfers Overall transfer level: Needs assistance Equipment used: Rolling walker (2 wheels) Transfers: Sit to/from Stand Sit to Stand: Mod assist           General transfer comment: x2 bouts. MOD A for upward momentum     Balance Overall balance assessment: Needs assistance Sitting-balance support: Feet supported;No upper extremity supported Sitting balance-Leahy Scale: Good Sitting balance - Comments: good static sitting balance within BOS   Standing balance support: Single extremity supported;During functional activity Standing balance-Leahy Scale: Fair Standing balance comment: Pt able to maintain standing balance for 45 sec while engaging in standing grooming tasks and frontal peri-care                           ADL either performed or assessed with clinical judgement   ADL Overall ADL's : Needs assistance/impaired     Grooming: Wash/dry hands;Wash/dry face;Min guard;Standing                       Toileting- Water quality scientist and Hygiene: Min guard;Sit to/from stand Electrical engineer  Details (indicate cue type and reason): MIN GUARD for standing frontal peri-care     Functional mobility during ADLs: Min guard;Rolling walker (2 wheels)        Cognition Arousal/Alertness: Awake/alert Behavior During  Therapy: WFL for tasks assessed/performed Overall Cognitive Status: Within Functional Limits for tasks assessed                                 General Comments: Oriented x 4, very motivated, pleasant, able to follow multi-step instructions          Exercises General Exercises - Upper Extremity Chair Push Up: Strengthening;Both;10 reps;Seated General Exercises - Lower Extremity Long Arc Quad: AROM;Both;10 reps;Seated Hip Flexion/Marching: AROM;Both;20 reps;Seated      General Comments Resting HR 100s. Following bed mobility and transfer to recliner HR 120s. While performing standing ADLs, HR MAX 140. HR returning to resting HR of 104 bpm once seated in recliner for 2 mins. RN informed of elevated HR during functional mobility    Pertinent Vitals/ Pain       Pain Assessment: No/denies pain         Frequency  Min 2X/week        Progress Toward Goals  OT Goals(current goals can now be found in the care plan section)  Progress towards OT goals: Progressing toward goals  Acute Rehab OT Goals Patient Stated Goal: to be able to drive again OT Goal Formulation: With patient Time For Goal Achievement: 11/25/20 Potential to Achieve Goals: [place] Discharge plan remains appropriate;Frequency remains appropriate       AM-PAC OT "6 Clicks" Daily Activity     Outcome Measure   Help from another person eating meals?: None Help from another person taking care of personal grooming?: A Little Help from another person toileting, which includes using toliet, bedpan, or urinal?: A Lot Help from another person bathing (including washing, rinsing, drying)?: A Lot Help from another person to put on and taking off regular upper body clothing?: None Help from another person to put on and taking off regular lower body clothing?: A Lot 6 Click Score: 17    End of Session Equipment Utilized During Treatment: Gait belt;Rolling walker (2 wheels)  OT Visit Diagnosis:  Unsteadiness on feet (R26.81);Muscle weakness (generalized) (M62.81)   Activity Tolerance Patient tolerated treatment well   Patient Left in chair;with call bell/phone within reach   Nurse Communication Mobility status;Other (comment) (vitals during functional mobility)        Time: 6237-6283 OT Time Calculation (min): 28 min  Charges: OT General Charges $OT Visit: 1 Visit OT Treatments $Self Care/Home Management : 8-22 mins $Therapeutic Activity: 8-22 mins  Fredirick Maudlin, OTR/L [place]

## 2020-11-14 DIAGNOSIS — A419 Sepsis, unspecified organism: Secondary | ICD-10-CM | POA: Diagnosis not present

## 2020-11-14 DIAGNOSIS — R6521 Severe sepsis with septic shock: Secondary | ICD-10-CM | POA: Diagnosis not present

## 2020-11-14 NOTE — Progress Notes (Addendum)
PROGRESS NOTE    Wendy Sanchez  OBS:[id number] DOB: February 17, 1950 DOA: 11/07/2020 PCP: Kristie Cowman, MD   Brief Narrative:  This 70 year old female , former smoker with COPD, nonischemic cardiomyopathy LVEF 50%, peripheral vascular disease, DVT/PE on Eliquis with recent perforated sigmoid diverticulitis status post open sigmoidectomy and colostomy placement at [facility] on 09/26/2020 who presented to [facility] ED with generalized weakness and low blood pressure from her rehab facility.   She is admitted for septic shock requiring presser support.  Septic shock is now resolved.   Significant events: 10/27: Admitted to ICU requiring pressors and fluid resuscitation. 10/28: Weaned off pressors 10/29: TRH picked up.  Transition back to Eliquis from heparin.  Transfer to floor 10/30: Patient agreeable to stop Eliquis.  Feeling weak, PT, OT consult, lower extremity Dopplers negative for DVT.  Sinus tach started on low-dose metoprolol 10/31: May have to wait for SNF due to COVID isolation guidelines.  COVID incidental positive on  11/1: Hypotensive, checking random cortisol and reduced dose of metoprolol to once daily   Assessment & Plan:   Principal Problem:   Septic shock ([place]) Active Problems:   Morbid obesity with BMI of 45.0-49.9, adult ([place])   Pulmonary embolism (HCC)   Temporal arteritis (HCC)   Colostomy in place [facility])   AKI (acute kidney injury) ([place])   COVID-19 virus infection   Sinus tachycardia   Septic shock sec. to possible stoma infection: > Resolved. Now resolved with treatment.  Patient is off pressors. Patient remained afebrile, no leukocytosis. IV Zosyn discontinued. No obvious source of infection other than mild stoma infection on admission seems to have resolved.  Sinus tachycardia: Heart rate goes up when patient does activity or ambulates. Continue metoprolol 12.5 mg daily.  COVID-19 infection: She is asymptomatic.  COVID+ on 10/27. She is not hypoxic,   not requiring any treatment. SNF will require patient to stay here 10 days per current guidelines. TOC working on placement, [facility].  Acute kidney injury: > Resolved. Improved with IV hydration.  Serum creatinine 0.93. Avoid nephrotoxic medications.  Colostomy in place: Recent perforated sigmoid diverticulitis s/p open sigmoidectomy and colostomy placement at [facility] on 09/26/2020.  Temporal arteritis: Continue prednisone 20 mg daily. Patient has follow-up appointment with [name] @ [facility] neurology.  Pulmonary embolism. PE was diagnosed in 2020, likely provoked in the setting of prolonged immobilization. No further anticoagulation needed.  Patient is agreeable to stop this. It was discussed with [name] who is agreeable to follow-up in 2 to 3 weeks as an outpatient. Lower extremity Doppler negative for DVT. Hemoglobin remained stable.  Morbid obesity Counseled on weight loss.:  DVT prophylaxis: Lovenox Code Status: Full code. Family Communication: No family at bed side. Disposition Plan:    Status is: Inpatient  Remains inpatient appropriate because:   Awaiting SNF placement due to COVID isolation. She may need to stay here total 10 days since last positive COVID on 10/27.   Consultants:  None.  Procedures: None  Antimicrobials:   Anti-infectives (From admission, onward)    Start     Dose/Rate Route Frequency Ordered Stop   11/08/20 1400  piperacillin-tazobactam (ZOSYN) IVPB 3.375 g  Status:  Discontinued        3.375 g 12.5 mL/hr over 240 Minutes Intravenous Every 8 hours 11/08/20 1027 11/12/20 0907   11/07/20 2130  metroNIDAZOLE (FLAGYL) IVPB 500 mg        500 mg 100 mL/hr over 60  Minutes Intravenous  Once 11/07/20 2120 11/08/20 0112   11/07/20 2000  vancomycin (VANCOCIN) IVPB 1000 mg/200 mL premix        1,000 mg 200 mL/hr over 60 Minutes Intravenous  Once 11/07/20 1951 11/07/20 2216   11/07/20 2000  ceFEPIme (MAXIPIME) 2 g in  sodium chloride 0.9 % 100 mL IVPB        2 g 200 mL/hr over 30 Minutes Intravenous  Once 11/07/20 1951 11/07/20 2046        Subjective: Patient was seen and examined at bedside.  Overnight events noted. Patient reports feeling better.  Her blood pressure is still low.  Denies any pain.  Objective: Vitals:   11/13/20 2352 11/14/20 0357 11/14/20 0830 11/14/20 0930  BP: 112/67 140/90 98/66 (!) 100/51  Pulse: 94 98 (!) 105 (!) 110  Resp: 18 17  16   Temp: 98.9 F (37.2 C) 99.9 F (37.7 C) 99.9 F (37.7 C)   TempSrc:   Oral   SpO2: 100% 93% 98% 99%  Weight:      Height:        Intake/Output Summary (Last 24 hours) at 11/14/2020 1204 Last data filed at 11/14/2020 0626 Gross per 24 hour  Intake --  Output 1250 ml  Net -1250 ml   Filed Weights   11/09/20 0500 11/11/20 0500 11/12/20 0528  Weight: (!) 143 kg (!) 142.4 kg (!) 142 kg    Examination:  General exam: Appears comfortable, not in any acute distress.  Deconditioned . Respiratory system: Clear to auscultation. Respiratory effort normal.  RR 12 Cardiovascular system: S1-S2 heard, regular rate and rhythm, no murmur. Gastrointestinal system: Abdomen is soft, nontender, nondistended, midline surgical scar +,  colostomy on the left side with brown-colored stool noted.   Central nervous system: Alert and oriented X 3. No focal neurological deficits. Extremities: No edema, no cyanosis, no clubbing. Skin: No rashes, lesions or ulcers Psychiatry: Judgement and insight appear normal. Mood & affect appropriate.     Data Reviewed: I have personally reviewed following labs and imaging studies  CBC: Recent Labs  Lab 11/07/20 1905 11/08/20 0209 11/09/20 0555 11/11/20 0559 11/12/20 0422 11/13/20 0453  WBC 10.7* 10.4 5.8 7.5 6.6 13.6*  NEUTROABS 9.1*  --   --   --   --   --   HGB 8.8* 8.9* 8.4* 8.6* 8.9* 8.9*  HCT 28.1* 27.9* 27.1* 27.2* 28.1* 28.2*  MCV 88.4 88.9 87.7 89.2 89.2 88.7  PLT 289 282 284 294 290 948    Basic Metabolic Panel: Recent Labs  Lab 11/08/20 0209 11/09/20 0555 11/10/20 0504 11/11/20 0559 11/12/20 0422 11/13/20 0453 11/13/20 0805  NA 137 137 139 141 139 140  --   K 3.7 4.1 4.6 4.7 4.6 5.2* 4.4  CL 100 101 107 108 109 114*  --   CO2 29 29 24 28 23 22   --   GLUCOSE 110* 114* 101* 89 86 104*  --   BUN 29* 21 20 19 20 19   --   CREATININE 1.39* 1.08* 0.96 1.06* 1.20* 0.93  --   CALCIUM 8.3* 8.9 9.0 8.8* 8.8* 8.6*  --   MG 2.0 2.3 2.5*  --   --   --   --   PHOS 3.9 4.2 4.2  --   --   --   --    GFR: Estimated Creatinine Clearance: 84.5 mL/min (by C-G formula based on SCr of 0.93 mg/dL). Liver Function Tests: Recent Labs  Lab 11/07/20 1905  AST 18  ALT 13  ALKPHOS 54  BILITOT 0.5  PROT 5.7*  ALBUMIN 2.3*   Recent Labs  Lab 11/08/20 0209  LIPASE 44   No results for input(s): AMMONIA in the last 168 hours. Coagulation Profile: Recent Labs  Lab 11/08/20 0221  INR 1.3*   Cardiac Enzymes: No results for input(s): CKTOTAL, CKMB, CKMBINDEX, TROPONINI in the last 168 hours. BNP (last 3 results) No results for input(s): PROBNP in the last 8760 hours. HbA1C: No results for input(s): HGBA1C in the last 72 hours. CBG: Recent Labs  Lab 11/07/20 1905 11/08/20 0142 11/12/20 2133  GLUCAP 129* 114* 119*   Lipid Profile: No results for input(s): CHOL, HDL, LDLCALC, TRIG, CHOLHDL, LDLDIRECT in the last 72 hours. Thyroid Function Tests: No results for input(s): TSH, T4TOTAL, FREET4, T3FREE, THYROIDAB in the last 72 hours. Anemia Panel: No results for input(s): VITAMINB12, FOLATE, FERRITIN, TIBC, IRON, RETICCTPCT in the last 72 hours. Sepsis Labs: Recent Labs  Lab 11/08/20 0209 11/08/20 1046 11/08/20 1256 11/08/20 1648 11/08/20 2040  PROCALCITON 0.16  --   --   --   --   LATICACIDVEN  --  3.6* 1.4 1.7 1.6    Recent Results (from the past 240 hour(s))  Resp Panel by RT-PCR (Flu A&B, Covid)     Status: Abnormal   Collection Time: 11/07/20 12:24 PM    Specimen: Nasopharyngeal(NP) swabs in vial transport medium  Result Value Ref Range Status   SARS Coronavirus 2 by RT PCR POSITIVE (A) NEGATIVE Final    Comment: RESULT CALLED TO, READ BACK BY AND VERIFIED WITH: NIKKIA VONGH 11/08/20 1357 MU (NOTE) SARS-CoV-2 target nucleic acids are DETECTED.  The SARS-CoV-2 RNA is generally detectable in upper respiratory specimens during the acute phase of infection. Positive results are indicative of the presence of the identified virus, but do not rule out bacterial infection or co-infection with other pathogens not detected by the test. Clinical correlation with patient history and other diagnostic information is necessary to determine patient infection status. The expected result is Negative.  Fact Sheet for Patients: EntrepreneurPulse.com.au  Fact Sheet for Healthcare Providers: IncredibleEmployment.be  This test is not yet approved or cleared by the [place] FDA and  has been authorized for detection and/or diagnosis of SARS-CoV-2 by FDA under an Emergency Use Authorization (EUA).  This EUA will remain in effect (meaning this test can be use d) for the duration of  the COVID-19 declaration under Section 564(b)(1) of the Act, 21 U.S.C. section 360bbb-3(b)(1), unless the authorization is terminated or revoked sooner.     Influenza A by PCR NEGATIVE NEGATIVE Final   Influenza B by PCR NEGATIVE NEGATIVE Final    Comment: (NOTE) The Xpert Xpress SARS-CoV-2/FLU/RSV plus assay is intended as an aid in the diagnosis of influenza from Nasopharyngeal swab specimens and should not be used as a sole basis for treatment. Nasal washings and aspirates are unacceptable for Xpert Xpress SARS-CoV-2/FLU/RSV testing.  Fact Sheet for Patients: EntrepreneurPulse.com.au  Fact Sheet for Healthcare Providers: IncredibleEmployment.be  This test is not yet approved or cleared by  the [place] FDA and has been authorized for detection and/or diagnosis of SARS-CoV-2 by FDA under an Emergency Use Authorization (EUA). This EUA will remain in effect (meaning this test can be used) for the duration of the COVID-19 declaration under Section 564(b)(1) of the Act, 21 U.S.C. section 360bbb-3(b)(1), unless the authorization is terminated or revoked.  Performed at [facility], [address] [place], [place] [address]  Blood culture (routine single)     Status: None   Collection Time: 11/07/20  7:12 PM   Specimen: BLOOD  Result Value Ref Range Status   Specimen Description BLOOD LEFT ANTECUBITAL  Final   Special Requests   Final    BOTTLES DRAWN AEROBIC AND ANAEROBIC Blood Culture adequate volume   Culture   Final    NO GROWTH 5 DAYS Performed at [facility], [address], [place], [place] [address]    Report Status 11/12/2020 FINAL  Final  Urine Culture     Status: None   Collection Time: 11/07/20 11:26 PM   Specimen: Urine, Random  Result Value Ref Range Status   Specimen Description   Final    URINE, RANDOM Performed at [facility], [address] [place], [place] [address]    Special Requests   Final    NONE Performed at [facility], [address], [place], [place] [address]    Culture   Final    NO GROWTH Performed at [facility], [place] [address] [place], [place] [address]    Report Status 11/08/2020 FINAL  Final  C Difficile Quick Screen w PCR reflex     Status: None   Collection Time: 11/08/20 12:16 AM   Specimen: STOOL  Result Value Ref Range Status   C Diff antigen NEGATIVE NEGATIVE Final   C Diff toxin NEGATIVE NEGATIVE Final   C Diff interpretation No C. difficile detected.  Final    Comment: Performed at [facility], [address] [place], [place] [address]  MRSA Next Gen by PCR, Nasal     Status: None   Collection Time: 11/08/20  1:35 AM   Specimen: Nasal Mucosa;  Nasal Swab  Result Value Ref Range Status   MRSA by PCR Next Gen NOT DETECTED NOT DETECTED Final    Comment: (NOTE) The GeneXpert MRSA Assay (FDA approved for NASAL specimens only), is one component of a comprehensive MRSA colonization surveillance program. It is not intended to diagnose MRSA infection nor to guide or monitor treatment for MRSA infections. Test performance is not FDA approved in patients less than 62 years old. Performed at [facility], [address] [place], [place] [address]   Gastrointestinal Panel by PCR , Stool     Status: None   Collection Time: 11/08/20  9:55 AM   Specimen: Stool  Result Value Ref Range Status   Campylobacter species NOT DETECTED NOT DETECTED Final   Plesimonas shigelloides NOT DETECTED NOT DETECTED Final   Salmonella species NOT DETECTED NOT DETECTED Final   Yersinia enterocolitica NOT DETECTED NOT DETECTED Final   Vibrio species NOT DETECTED NOT DETECTED Final   Vibrio cholerae NOT DETECTED NOT DETECTED Final   Enteroaggregative E coli (EAEC) NOT DETECTED NOT DETECTED Final   Enteropathogenic E coli (EPEC) NOT DETECTED NOT DETECTED Final   Enterotoxigenic E coli (ETEC) NOT DETECTED NOT DETECTED Final   Shiga like toxin producing E coli (STEC) NOT DETECTED NOT DETECTED Final   Shigella/Enteroinvasive E coli (EIEC) NOT DETECTED NOT DETECTED Final   Cryptosporidium NOT DETECTED NOT DETECTED Final   Cyclospora cayetanensis NOT DETECTED NOT DETECTED Final   Entamoeba histolytica NOT DETECTED NOT DETECTED Final   Giardia lamblia NOT DETECTED NOT DETECTED Final   Adenovirus F40/41 NOT DETECTED NOT DETECTED Final   Astrovirus NOT DETECTED NOT DETECTED Final   Norovirus GI/GII NOT DETECTED NOT DETECTED Final   Rotavirus A NOT DETECTED NOT DETECTED  Final   Sapovirus (I, II, IV, and V) NOT DETECTED NOT DETECTED Final    Comment: Performed at [facility], [place], [place] [address]  Respiratory (~20 pathogens)  panel by PCR     Status: None   Collection Time: 11/08/20 12:24 PM   Specimen: Nasopharyngeal Swab; Respiratory  Result Value Ref Range Status   Adenovirus NOT DETECTED NOT DETECTED Final   Coronavirus 229E NOT DETECTED NOT DETECTED Final    Comment: (NOTE) The Coronavirus on the Respiratory Panel, DOES NOT test for the novel  Coronavirus (2019 nCoV)    Coronavirus HKU1 NOT DETECTED NOT DETECTED Final   Coronavirus NL63 NOT DETECTED NOT DETECTED Final   Coronavirus OC43 NOT DETECTED NOT DETECTED Final   Metapneumovirus NOT DETECTED NOT DETECTED Final   Rhinovirus / Enterovirus NOT DETECTED NOT DETECTED Final   Influenza A NOT DETECTED NOT DETECTED Final   Influenza B NOT DETECTED NOT DETECTED Final   Parainfluenza Virus 1 NOT DETECTED NOT DETECTED Final   Parainfluenza Virus 2 NOT DETECTED NOT DETECTED Final   Parainfluenza Virus 3 NOT DETECTED NOT DETECTED Final   Parainfluenza Virus 4 NOT DETECTED NOT DETECTED Final   Respiratory Syncytial Virus NOT DETECTED NOT DETECTED Final   Bordetella pertussis NOT DETECTED NOT DETECTED Final   Bordetella Parapertussis NOT DETECTED NOT DETECTED Final   Chlamydophila pneumoniae NOT DETECTED NOT DETECTED Final   Mycoplasma pneumoniae NOT DETECTED NOT DETECTED Final    Comment: Performed at [facility], [place]. [address] [place], [place] [address]  SARS CORONAVIRUS 2 (TAT 6-24 HRS) Nasopharyngeal Nasopharyngeal Swab     Status: Abnormal   Collection Time: 11/11/20 12:23 PM   Specimen: Nasopharyngeal Swab  Result Value Ref Range Status   SARS Coronavirus 2 POSITIVE (A) NEGATIVE Final    Comment: (NOTE) SARS-CoV-2 target nucleic acids are DETECTED.  The SARS-CoV-2 RNA is generally detectable in upper and lower respiratory specimens during the acute phase of infection. Positive results are indicative of the presence of SARS-CoV-2 RNA. Clinical correlation with patient history and other diagnostic information is  necessary to determine  patient infection status. Positive results do not rule out bacterial infection or co-infection with other viruses.  The expected result is Negative.  Fact Sheet for Patients: SugarRoll.be  Fact Sheet for Healthcare Providers: https://www.woods-mathews.com/  This test is not yet approved or cleared by the [place] FDA and  has been authorized for detection and/or diagnosis of SARS-CoV-2 by FDA under an Emergency Use Authorization (EUA). This EUA will remain  in effect (meaning this test can be used) for the duration of the COVID-19 declaration under Section 564(b)(1) of the Act, 21 U. S.C. section 360bbb-3(b)(1), unless the authorization is terminated or revoked sooner.   Performed at [facility], [place] [address], [place], [place] [address]     Radiology Studies: No results found.  Scheduled Meds:  Chlorhexidine Gluconate Cloth  6 each Topical Daily   enoxaparin (LOVENOX) injection  0.5 mg/kg Subcutaneous G86P   folic acid  1 mg Oral Daily   gabapentin  600 mg Oral TID   guaiFENesin-dextromethorphan  5 mL Oral Q6H   metoprolol tartrate  12.5 mg Oral Daily   montelukast  10 mg Oral QHS   pantoprazole  40 mg Oral BID   predniSONE  20 mg Oral Q breakfast   umeclidinium-vilanterol  1 puff Inhalation Daily   Continuous Infusions:  sodium chloride       LOS:  7 days    Time spent: 25 mins    Shawna Clamp, MD [facility]   If 7PM-7AM, please contact night-coverage

## 2020-11-14 NOTE — TOC Progression Note (Signed)
Transition of Care [facility]) - Progression Note    Patient Details  Name: Wendy Sanchez MRN: [id number] Date of Birth: 06/30/1950  Transition of Care [facility]) CM/SW [place], RN Phone Number: 11/14/2020, 9:55 AM  Clinical Narrative:  Patient states she will speak to her family about insurance and copay for [facility].  Explained that [facility] was an option, patient is amenable if her family chooses that option.  She will notify RNCM later today with disposition decision.          Expected Discharge Plan and Services                                                 Social Determinants of Health (SDOH) Interventions    Readmission Risk Interventions No flowsheet data found.

## 2020-11-15 DIAGNOSIS — R6521 Severe sepsis with septic shock: Secondary | ICD-10-CM | POA: Diagnosis not present

## 2020-11-15 DIAGNOSIS — A419 Sepsis, unspecified organism: Secondary | ICD-10-CM | POA: Diagnosis not present

## 2020-11-15 LAB — CBC
HCT: 29.6 % — ABNORMAL LOW (ref 36.0–46.0)
Hemoglobin: 8.7 g/dL — ABNORMAL LOW (ref 12.0–15.0)
MCH: 26.9 pg (ref 26.0–34.0)
MCHC: 29.4 g/dL — ABNORMAL LOW (ref 30.0–36.0)
MCV: 91.4 fL (ref 80.0–100.0)
Platelets: 382 10*3/uL (ref 150–400)
RBC: 3.24 MIL/uL — ABNORMAL LOW (ref 3.87–5.11)
RDW: 17 % — ABNORMAL HIGH (ref 11.5–15.5)
WBC: 5.5 10*3/uL (ref 4.0–10.5)
nRBC: 0.4 % — ABNORMAL HIGH (ref 0.0–0.2)

## 2020-11-15 LAB — BASIC METABOLIC PANEL
Anion gap: 7 (ref 5–15)
BUN: 23 mg/dL (ref 8–23)
CO2: 21 mmol/L — ABNORMAL LOW (ref 22–32)
Calcium: 8.7 mg/dL — ABNORMAL LOW (ref 8.9–10.3)
Chloride: 111 mmol/L (ref 98–111)
Creatinine, Ser: 0.99 mg/dL (ref 0.44–1.00)
GFR, Estimated: >60 mL/min (ref >60)
Glucose, Bld: 77 mg/dL (ref 70–99)
Potassium: 4.1 mmol/L (ref 3.5–5.1)
Sodium: 139 mmol/L (ref 135–145)

## 2020-11-15 LAB — MAGNESIUM: Magnesium: 2.3 mg/dL (ref 1.7–2.4)

## 2020-11-15 LAB — PHOSPHORUS: Phosphorus: 4 mg/dL (ref 2.5–4.6)

## 2020-11-15 MED ORDER — SODIUM CHLORIDE 0.9 % IV BOLUS
500.0000 mL | Freq: Once | INTRAVENOUS | Status: AC
  Administered 2020-11-15: 500 mL via INTRAVENOUS

## 2020-11-15 MED ORDER — FLUCONAZOLE 50 MG PO TABS
150.0000 mg | ORAL_TABLET | Freq: Once | ORAL | Status: AC
  Administered 2020-11-15: 150 mg via ORAL
  Filled 2020-11-15: qty 1

## 2020-11-15 MED ORDER — MAGIC MOUTHWASH
10.0000 mL | Freq: Three times a day (TID) | ORAL | Status: DC | PRN
  Administered 2020-11-16: 10 mL via ORAL
  Filled 2020-11-15 (×7): qty 10

## 2020-11-15 NOTE — Progress Notes (Signed)
PROGRESS NOTE    Wendy Sanchez  YJE:[id number] DOB: October 20, 1950 DOA: 11/07/2020 PCP: Kristie Cowman, MD   Brief Narrative:  This 70 year old female , former smoker with COPD, nonischemic cardiomyopathy LVEF 50%, peripheral vascular disease, DVT/PE on Eliquis with recent perforated sigmoid diverticulitis status post open sigmoidectomy and colostomy placement at [facility] on 09/26/2020 who presented to [facility] ED with generalized weakness and low blood pressure from her rehab facility.   She is admitted for septic shock requiring presser support.  Septic shock is now resolved.   Significant events: 10/27: Admitted to ICU requiring pressors and fluid resuscitation. 10/28: Weaned off pressors 10/29: TRH picked up.  Transition back to Eliquis from heparin.  Transfer to floor 10/30: Patient agreeable to stop Eliquis.  Feeling weak, PT, OT consult, lower extremity Dopplers negative for DVT.  Sinus tach started on low-dose metoprolol 10/31: May have to wait for SNF due to COVID isolation guidelines.  COVID incidental positive on  11/1:   Hypotensive, checking random cortisol and reduced dose of metoprolol to once daily. 11/4:   BP remains stable. Awaiting completion of quarantine on 11/8.   Assessment & Plan:   Principal Problem:   Septic shock ([place]) Active Problems:   Morbid obesity with BMI of 45.0-49.9, adult ([place])   Pulmonary embolism (HCC)   Temporal arteritis (HCC)   Colostomy in place [facility])   AKI (acute kidney injury) ([place])   COVID-19 virus infection   Sinus tachycardia   Septic shock sec. to possible stoma infection: > Resolved. Now resolved with treatment.  Patient is off pressors. Patient remained afebrile, no leukocytosis. IV Zosyn discontinued. No obvious source of infection other than mild stoma infection on admission seems to have resolved.  Sinus tachycardia: Heart rate goes up when patient does activity or ambulates. Continue metoprolol 12.5 mg daily.  COVID-19  infection: She is asymptomatic.  COVID+ on 10/27. She is not hypoxic,  not requiring any treatment. SNF will require patient to stay here 10 days per current guidelines. TOC working on placement, [facility].  Acute kidney injury: > Resolved. Improved with IV hydration.  Serum creatinine 0.93. Avoid nephrotoxic medications.  Colostomy in place: Recent perforated sigmoid diverticulitis s/p open sigmoidectomy and colostomy placement at [facility] on 09/26/2020.  Temporal arteritis: Continue prednisone 20 mg daily. Patient has follow-up appointment with [name] @ [facility] neurology.  Pulmonary embolism. PE was diagnosed in 2020, likely provoked in the setting of prolonged immobilization. No further anticoagulation needed.  Patient is agreeable to stop this. It was discussed with [name] who is agreeable to follow-up in 2 to 3 weeks as an outpatient. Lower extremity Doppler negative for DVT. Hemoglobin remained stable.  Morbid obesity Counseled on weight loss.:  DVT prophylaxis: Lovenox Code Status: Full code. Family Communication: No family at bed side. Disposition Plan:    Status is: Inpatient  Remains inpatient appropriate because:   Awaiting SNF placement due to COVID isolation. She may need to stay here total 10 days since last positive COVID on 10/27. She can be discharged to SNF on 11/19/2020.   Consultants:  None.  Procedures: None  Antimicrobials:   Anti-infectives (From admission, onward)    Start     Dose/Rate Route Frequency Ordered Stop   11/08/20 1400  piperacillin-tazobactam (ZOSYN) IVPB 3.375 g  Status:  Discontinued        3.375 g 12.5 mL/hr over 240 Minutes Intravenous Every 8 hours 11/08/20 1027 11/12/20 0907  11/07/20 2130  metroNIDAZOLE (FLAGYL) IVPB 500 mg        500 mg 100 mL/hr over 60 Minutes Intravenous  Once 11/07/20 2120 11/08/20 0112   11/07/20 2000  vancomycin (VANCOCIN) IVPB 1000 mg/200 mL premix        1,000  mg 200 mL/hr over 60 Minutes Intravenous  Once 11/07/20 1951 11/07/20 2216   11/07/20 2000  ceFEPIme (MAXIPIME) 2 g in sodium chloride 0.9 % 100 mL IVPB        2 g 200 mL/hr over 30 Minutes Intravenous  Once 11/07/20 1951 11/07/20 2046        Subjective: Patient was seen and examined at bedside.  Overnight events noted. Patient reports feeling better.  Her blood pressure is  now stable.  Denies any pain.  Objective: Vitals:   11/15/20 0053 11/15/20 0556 11/15/20 0828 11/15/20 1142  BP: 103/67 103/64 108/75   Pulse: 77 78 75   Resp: 16 18 16    Temp: 98 F (36.7 C) 98 F (36.7 C) (!) 97.5 F (36.4 C)   TempSrc: Oral Oral    SpO2: 100% 100% 90% 93%  Weight:      Height:        Intake/Output Summary (Last 24 hours) at 11/15/2020 1143 Last data filed at 11/15/2020 0920 Gross per 24 hour  Intake 120 ml  Output 1750 ml  Net -1630 ml   Filed Weights   11/09/20 0500 11/11/20 0500 11/12/20 0528  Weight: (!) 143 kg (!) 142.4 kg (!) 142 kg    Examination:  General exam: Appears comfortable, not in any acute distress.  Deconditioned . Respiratory system: Clear to auscultation. Respiratory effort normal.  RR 12 Cardiovascular system: S1-S2 heard, regular rate and rhythm, no murmur. Gastrointestinal system: Abdomen is soft, nontender, nondistended, midline surgical scar +,  colostomy on the left side with brown-colored stool noted.   Central nervous system: Alert and oriented X 3. No focal neurological deficits. Extremities: No edema, no cyanosis, no clubbing. Skin: No rashes, lesions or ulcers Psychiatry: Judgement and insight appear normal. Mood & affect appropriate.     Data Reviewed: I have personally reviewed following labs and imaging studies  CBC: Recent Labs  Lab 11/09/20 0555 11/11/20 0559 11/12/20 0422 11/13/20 0453 11/15/20 0725  WBC 5.8 7.5 6.6 13.6* 5.5  HGB 8.4* 8.6* 8.9* 8.9* 8.7*  HCT 27.1* 27.2* 28.1* 28.2* 29.6*  MCV 87.7 89.2 89.2 88.7 91.4  PLT  284 294 290 318 017   Basic Metabolic Panel: Recent Labs  Lab 11/09/20 0555 11/10/20 0504 11/11/20 0559 11/12/20 0422 11/13/20 0453 11/13/20 0805 11/15/20 0725  NA 137 139 141 139 140  --  139  K 4.1 4.6 4.7 4.6 5.2* 4.4 4.1  CL 101 107 108 109 114*  --  111  CO2 29 24 28 23 22   --  21*  GLUCOSE 114* 101* 89 86 104*  --  77  BUN 21 20 19 20 19   --  23  CREATININE 1.08* 0.96 1.06* 1.20* 0.93  --  0.99  CALCIUM 8.9 9.0 8.8* 8.8* 8.6*  --  8.7*  MG 2.3 2.5*  --   --   --   --  2.3  PHOS 4.2 4.2  --   --   --   --  4.0   GFR: Estimated Creatinine Clearance: 79.4 mL/min (by C-G formula based on SCr of 0.99 mg/dL). Liver Function Tests: No results for input(s): AST, ALT, ALKPHOS, BILITOT, PROT, ALBUMIN in  the last 168 hours.  No results for input(s): LIPASE, AMYLASE in the last 168 hours.  No results for input(s): AMMONIA in the last 168 hours. Coagulation Profile: No results for input(s): INR, PROTIME in the last 168 hours.  Cardiac Enzymes: No results for input(s): CKTOTAL, CKMB, CKMBINDEX, TROPONINI in the last 168 hours. BNP (last 3 results) No results for input(s): PROBNP in the last 8760 hours. HbA1C: No results for input(s): HGBA1C in the last 72 hours. CBG: Recent Labs  Lab 11/12/20 2133  GLUCAP 119*   Lipid Profile: No results for input(s): CHOL, HDL, LDLCALC, TRIG, CHOLHDL, LDLDIRECT in the last 72 hours. Thyroid Function Tests: No results for input(s): TSH, T4TOTAL, FREET4, T3FREE, THYROIDAB in the last 72 hours. Anemia Panel: No results for input(s): VITAMINB12, FOLATE, FERRITIN, TIBC, IRON, RETICCTPCT in the last 72 hours. Sepsis Labs: Recent Labs  Lab 11/08/20 1256 11/08/20 1648 11/08/20 2040  LATICACIDVEN 1.4 1.7 1.6    Recent Results (from the past 240 hour(s))  Resp Panel by RT-PCR (Flu A&B, Covid)     Status: Abnormal   Collection Time: 11/07/20 12:24 PM   Specimen: Nasopharyngeal(NP) swabs in vial transport medium  Result Value Ref Range  Status   SARS Coronavirus 2 by RT PCR POSITIVE (A) NEGATIVE Final    Comment: RESULT CALLED TO, READ BACK BY AND VERIFIED WITH: NIKKIA VONGH 11/08/20 1357 MU (NOTE) SARS-CoV-2 target nucleic acids are DETECTED.  The SARS-CoV-2 RNA is generally detectable in upper respiratory specimens during the acute phase of infection. Positive results are indicative of the presence of the identified virus, but do not rule out bacterial infection or co-infection with other pathogens not detected by the test. Clinical correlation with patient history and other diagnostic information is necessary to determine patient infection status. The expected result is Negative.  Fact Sheet for Patients: EntrepreneurPulse.com.au  Fact Sheet for Healthcare Providers: IncredibleEmployment.be  This test is not yet approved or cleared by the [place] FDA and  has been authorized for detection and/or diagnosis of SARS-CoV-2 by FDA under an Emergency Use Authorization (EUA).  This EUA will remain in effect (meaning this test can be use d) for the duration of  the COVID-19 declaration under Section 564(b)(1) of the Act, 21 U.S.C. section 360bbb-3(b)(1), unless the authorization is terminated or revoked sooner.     Influenza A by PCR NEGATIVE NEGATIVE Final   Influenza B by PCR NEGATIVE NEGATIVE Final    Comment: (NOTE) The Xpert Xpress SARS-CoV-2/FLU/RSV plus assay is intended as an aid in the diagnosis of influenza from Nasopharyngeal swab specimens and should not be used as a sole basis for treatment. Nasal washings and aspirates are unacceptable for Xpert Xpress SARS-CoV-2/FLU/RSV testing.  Fact Sheet for Patients: EntrepreneurPulse.com.au  Fact Sheet for Healthcare Providers: IncredibleEmployment.be  This test is not yet approved or cleared by the [place] FDA and has been authorized for detection and/or diagnosis of  SARS-CoV-2 by FDA under an Emergency Use Authorization (EUA). This EUA will remain in effect (meaning this test can be used) for the duration of the COVID-19 declaration under Section 564(b)(1) of the Act, 21 U.S.C. section 360bbb-3(b)(1), unless the authorization is terminated or revoked.  Performed at [facility], [address] [place], [place] [address]   Blood culture (routine single)     Status: None   Collection Time: 11/07/20  7:12 PM   Specimen: BLOOD  Result Value Ref Range Status   Specimen Description BLOOD LEFT ANTECUBITAL  Final  Special Requests   Final    BOTTLES DRAWN AEROBIC AND ANAEROBIC Blood Culture adequate volume   Culture   Final    NO GROWTH 5 DAYS Performed at [facility], [address] [place], [place] [address]    Report Status 11/12/2020 FINAL  Final  Urine Culture     Status: None   Collection Time: 11/07/20 11:26 PM   Specimen: Urine, Random  Result Value Ref Range Status   Specimen Description   Final    URINE, RANDOM Performed at [facility], [address], [place], [place] [address]    Special Requests   Final    NONE Performed at [facility], [address] [place], [place] [address]    Culture   Final    NO GROWTH Performed at [facility], [place] [address], [place], [place] [address]    Report Status 11/08/2020 FINAL  Final  C Difficile Quick Screen w PCR reflex     Status: None   Collection Time: 11/08/20 12:16 AM   Specimen: STOOL  Result Value Ref Range Status   C Diff antigen NEGATIVE NEGATIVE Final   C Diff toxin NEGATIVE NEGATIVE Final   C Diff interpretation No C. difficile detected.  Final    Comment: Performed at [facility], [address] [place], [place] [address]  MRSA Next Gen by PCR, Nasal     Status: None   Collection Time: 11/08/20  1:35 AM   Specimen: Nasal Mucosa; Nasal Swab  Result Value Ref Range Status   MRSA by PCR Next Gen NOT DETECTED NOT  DETECTED Final    Comment: (NOTE) The GeneXpert MRSA Assay (FDA approved for NASAL specimens only), is one component of a comprehensive MRSA colonization surveillance program. It is not intended to diagnose MRSA infection nor to guide or monitor treatment for MRSA infections. Test performance is not FDA approved in patients less than 69 years old. Performed at [facility], [address] [place], [place] [address]   Gastrointestinal Panel by PCR , Stool     Status: None   Collection Time: 11/08/20  9:55 AM   Specimen: Stool  Result Value Ref Range Status   Campylobacter species NOT DETECTED NOT DETECTED Final   Plesimonas shigelloides NOT DETECTED NOT DETECTED Final   Salmonella species NOT DETECTED NOT DETECTED Final   Yersinia enterocolitica NOT DETECTED NOT DETECTED Final   Vibrio species NOT DETECTED NOT DETECTED Final   Vibrio cholerae NOT DETECTED NOT DETECTED Final   Enteroaggregative E coli (EAEC) NOT DETECTED NOT DETECTED Final   Enteropathogenic E coli (EPEC) NOT DETECTED NOT DETECTED Final   Enterotoxigenic E coli (ETEC) NOT DETECTED NOT DETECTED Final   Shiga like toxin producing E coli (STEC) NOT DETECTED NOT DETECTED Final   Shigella/Enteroinvasive E coli (EIEC) NOT DETECTED NOT DETECTED Final   Cryptosporidium NOT DETECTED NOT DETECTED Final   Cyclospora cayetanensis NOT DETECTED NOT DETECTED Final   Entamoeba histolytica NOT DETECTED NOT DETECTED Final   Giardia lamblia NOT DETECTED NOT DETECTED Final   Adenovirus F40/41 NOT DETECTED NOT DETECTED Final   Astrovirus NOT DETECTED NOT DETECTED Final   Norovirus GI/GII NOT DETECTED NOT DETECTED Final   Rotavirus A NOT DETECTED NOT DETECTED Final   Sapovirus (I, II, IV, and V) NOT DETECTED NOT DETECTED Final    Comment: Performed at [facility], [address] [place], [place] [address]  Respiratory (~20 pathogens) panel by PCR  Status: None   Collection Time: 11/08/20 12:24 PM   Specimen:  Nasopharyngeal Swab; Respiratory  Result Value Ref Range Status   Adenovirus NOT DETECTED NOT DETECTED Final   Coronavirus 229E NOT DETECTED NOT DETECTED Final    Comment: (NOTE) The Coronavirus on the Respiratory Panel, DOES NOT test for the novel  Coronavirus (2019 nCoV)    Coronavirus HKU1 NOT DETECTED NOT DETECTED Final   Coronavirus NL63 NOT DETECTED NOT DETECTED Final   Coronavirus OC43 NOT DETECTED NOT DETECTED Final   Metapneumovirus NOT DETECTED NOT DETECTED Final   Rhinovirus / Enterovirus NOT DETECTED NOT DETECTED Final   Influenza A NOT DETECTED NOT DETECTED Final   Influenza B NOT DETECTED NOT DETECTED Final   Parainfluenza Virus 1 NOT DETECTED NOT DETECTED Final   Parainfluenza Virus 2 NOT DETECTED NOT DETECTED Final   Parainfluenza Virus 3 NOT DETECTED NOT DETECTED Final   Parainfluenza Virus 4 NOT DETECTED NOT DETECTED Final   Respiratory Syncytial Virus NOT DETECTED NOT DETECTED Final   Bordetella pertussis NOT DETECTED NOT DETECTED Final   Bordetella Parapertussis NOT DETECTED NOT DETECTED Final   Chlamydophila pneumoniae NOT DETECTED NOT DETECTED Final   Mycoplasma pneumoniae NOT DETECTED NOT DETECTED Final    Comment: Performed at [facility], [place]. [address], [place], [place] [address]  SARS CORONAVIRUS 2 (TAT 6-24 HRS) Nasopharyngeal Nasopharyngeal Swab     Status: Abnormal   Collection Time: 11/11/20 12:23 PM   Specimen: Nasopharyngeal Swab  Result Value Ref Range Status   SARS Coronavirus 2 POSITIVE (A) NEGATIVE Final    Comment: (NOTE) SARS-CoV-2 target nucleic acids are DETECTED.  The SARS-CoV-2 RNA is generally detectable in upper and lower respiratory specimens during the acute phase of infection. Positive results are indicative of the presence of SARS-CoV-2 RNA. Clinical correlation with patient history and other diagnostic information is  necessary to determine patient infection status. Positive results do not rule out bacterial infection or  co-infection with other viruses.  The expected result is Negative.  Fact Sheet for Patients: SugarRoll.be  Fact Sheet for Healthcare Providers: https://www.woods-mathews.com/  This test is not yet approved or cleared by the [place] FDA and  has been authorized for detection and/or diagnosis of SARS-CoV-2 by FDA under an Emergency Use Authorization (EUA). This EUA will remain  in effect (meaning this test can be used) for the duration of the COVID-19 declaration under Section 564(b)(1) of the Act, 21 U. S.C. section 360bbb-3(b)(1), unless the authorization is terminated or revoked sooner.   Performed at [facility], [place] [address] [place], [place] [address]     Radiology Studies: No results found.  Scheduled Meds:  Chlorhexidine Gluconate Cloth  6 each Topical Daily   enoxaparin (LOVENOX) injection  0.5 mg/kg Subcutaneous B34L   folic acid  1 mg Oral Daily   gabapentin  600 mg Oral TID   guaiFENesin-dextromethorphan  5 mL Oral Q6H   metoprolol tartrate  12.5 mg Oral Daily   montelukast  10 mg Oral QHS   pantoprazole  40 mg Oral BID   predniSONE  20 mg Oral Q breakfast   umeclidinium-vilanterol  1 puff Inhalation Daily   Continuous Infusions:  sodium chloride       LOS: 8 days    Time spent: 25 mins    Shawna Clamp, MD [facility]   If 7PM-7AM, please contact night-coverage

## 2020-11-15 NOTE — Progress Notes (Signed)
Occupational Therapy Treatment Patient Details Name: Wendy Sanchez MRN: [id number] DOB: June 19, 1950 Today's Date: 11/15/2020   History of present illness Pt is a 70 y.o. Ffemale here with weakness & low BP and admitted for septic shock. PMH includes COPD, nonischemic cardiomyopathy EF 50%, peripheral vascular disease, DVT/PE on Eliquis with recent perforated sigmoid diverticulitis s/p sigmoidectomy and colostomy placement at [name](09/26/2020), covid 19.   OT comments  Pt in bed upon OT arrival.  Agreeable to OT session.  Pt tolerated functional mobility from bed to toilet using RW and close supv.  Able to urinate at toilet and manage hygiene with supv while seated.  Min A sit to stand from lower toilet.  Tolerated standing at sink for hand hygiene and oral care with minimal SOB.  Mod A to lift feet back into bed with pt demonstrating good ability to scoot toward [facility] with HOB and foot of bed adjustment.  HR 98-103 before and after activity.  Attempted repeatedly to take HR during activity without success using pulse oximeter.  Pt reported no dizziness, minimal SOB with activity noted.  Pt left in bed with all needs met, bed alarm on, call light and phone within reach.    Recommendations for follow up therapy are one component of a multi-disciplinary discharge planning process, led by the attending physician.  Recommendations may be updated based on patient status, additional functional criteria and insurance authorization.    Follow Up Recommendations  Home health OT    Assistance Recommended at Discharge Intermittent Supervision/Assistance               Precautions / Restrictions Precautions Precautions: Fall Precaution Comments: colostomy, midline incision Restrictions Weight Bearing Restrictions: No       Mobility Bed Mobility Overal bed mobility: Needs Assistance Bed Mobility: Sit to Supine     Supine to sit: Supervision;HOB elevated Sit to supine: Mod assist    General bed mobility comments: assist to lift BLEs into bed.  Pt able to scoot self toward [facility] with feet elevated, head lowered, grabbing rail overhead, knees bent and pushing feet into bed. Patient Response: Cooperative  Transfers Overall transfer level: Needs assistance Equipment used: Rolling walker (2 wheels) Transfers: Sit to/from Stand Sit to Stand: Supervision;From elevated surface           General transfer comment: Min A from lower toilet in bathroom     Balance Overall balance assessment: Needs assistance Sitting-balance support: Feet supported;No upper extremity supported Sitting balance-Leahy Scale: Good Sitting balance - Comments: good static sitting balance within BOS   Standing balance support: Bilateral upper extremity supported Standing balance-Leahy Scale: Fair Standing balance comment: Tolerated standing at sink for oral care, 1 hand on sink or walker for support; forward flexed posture                           ADL either performed or assessed with clinical judgement   ADL Overall ADL's : Needs assistance/impaired     Grooming: Wash/dry hands;Oral care Grooming Details (indicate cue type and reason): able to perform today standing at sink with close supv and set up of supplies                     Toileting- Clothing Manipulation and Hygiene: Minimal assistance Toileting - Clothing Manipulation Details (indicate cue type and reason): Min guard to descent to commode with grab bar and walker, min A for sit to stand (low  height)     Functional mobility during ADLs: Min guard;Rolling walker (2 wheels) General ADL Comments: RW to amb from bed <>toilet and able to urinate.  Tolerated standing at sink for hand hygiene and oral care.  HR 103 upon completion of ADLs.  No c/o dizziness.     Vision Baseline Vision/History: 1 Wears glasses Patient Visual Report: No change from baseline                Cognition Arousal/Alertness:  Awake/alert Behavior During Therapy: WFL for tasks assessed/performed Overall Cognitive Status: Within Functional Limits for tasks assessed                                 General Comments: Oriented x 4, very motivated, pleasant, able to follow multi-step instructions                      General Comments Resting HR 98, 103 following toileting and standing to brush teeth.  Attempted to take HR during activity but was not measuring.  Minimal SOB, no reports of dizziness during activity.    Pertinent Vitals/ Pain       Pain Assessment: 0-10 Pain Score: 6  Pain Location: low back Pain Descriptors / Indicators: Sharp Pain Intervention(s): Monitored during session;Repositioned  Home Living Family/patient expects to be discharged to:: Private residence                                                      Frequency  Min 2X/week        Progress Toward Goals  OT Goals(current goals can now be found in the care plan section)  Progress towards OT goals: Progressing toward goals  Acute Rehab OT Goals Patient Stated Goal: to be able to drive again OT Goal Formulation: With patient Time For Goal Achievement: 11/25/20 Potential to Achieve Goals: [place] Discharge plan remains appropriate;Frequency remains appropriate                     AM-PAC OT "6 Clicks" Daily Activity     Outcome Measure   Help from another person eating meals?: None Help from another person taking care of personal grooming?: A Little Help from another person toileting, which includes using toliet, bedpan, or urinal?: A Little Help from another person bathing (including washing, rinsing, drying)?: A Lot Help from another person to put on and taking off regular upper body clothing?: None Help from another person to put on and taking off regular lower body clothing?: A Lot 6 Click Score: 18    End of Session Equipment Utilized During Treatment: Rolling  walker (2 wheels)  OT Visit Diagnosis: Unsteadiness on feet (R26.81);Muscle weakness (generalized) (M62.81)   Activity Tolerance Patient tolerated treatment well   Patient Left in bed;with call bell/phone within reach;with bed alarm set   Nurse Communication          Time: 9417-4081 OT Time Calculation (min): 26 min  Charges: OT General Charges $OT Visit: 1 Visit OT Treatments $Self Care/Home Management : 23-37 mins  Leta Speller, MS, OTR/L   Darleene Cleaver 11/15/2020, 11:54 AM

## 2020-11-16 ENCOUNTER — Other Ambulatory Visit: Payer: Self-pay

## 2020-11-16 DIAGNOSIS — A419 Sepsis, unspecified organism: Secondary | ICD-10-CM | POA: Diagnosis not present

## 2020-11-16 DIAGNOSIS — R6521 Severe sepsis with septic shock: Secondary | ICD-10-CM | POA: Diagnosis not present

## 2020-11-16 LAB — ECHOCARDIOGRAM COMPLETE BUBBLE STUDY
AR max vel: 1.99 cm2
AV Area VTI: 2.08 cm2
AV Area mean vel: 2.14 cm2
AV Mean grad: 8 mmHg
AV Peak grad: 16.3 mmHg
Ao pk vel: 2.02 m/s
MV VTI: 2.41 cm2
S' Lateral: 2.7 cm

## 2020-11-16 MED ORDER — MAGIC MOUTHWASH
10.0000 mL | Freq: Three times a day (TID) | ORAL | Status: DC | PRN
  Administered 2020-11-16 – 2020-11-17 (×3): 10 mL via ORAL
  Filled 2020-11-16 (×5): qty 10

## 2020-11-16 NOTE — TOC Progression Note (Signed)
Transition of Care [facility]) - Progression Note    Patient Details  Name: Wendy Sanchez MRN: [id number] Date of Birth: 1950-12-28  Transition of Care [facility]) CM/SW Contact  Wendy Sanchez Wendy Pod, RN Phone Number: 11/16/2020, 4:24 PM  Clinical Narrative:    Spoke with pt  on HHPT and pt is receptive to any agency for therapy. [facility] Sanchez) called for service (pending). Pt will possibly need HHPT/RN for new colostomy. Will confirm discharge date and follow up with [place] for confirmation closer to pt's discharge.    [name] if no HH accepted.[name] primary caregiver along to assist pt with medical appointments, medications can afford and transportation home when pt is ready for discharge. Discussed items pt has been informed to obtain for home monitoring one that has the pt's HR display and pulse O2 device can purchase OTC at the local pharmacy store.  [facility]) will deliver RW to pt's room.    TOC will continue to follow up for any additional needs.        Expected Discharge Plan and Services                                                 Social Determinants of Health (SDOH) Interventions    Readmission Risk Interventions No flowsheet data found.

## 2020-11-16 NOTE — Progress Notes (Addendum)
PROGRESS NOTE  Wendy Sanchez TKW:[id number] DOB: [birth year] DOA: 11/07/2020 PCP: Kristie Cowman, MD  HPI/Recap of past 24 hours: Brief Narrative:  This is a 70 year old female former smoker with COPD nonischemic heart disease nonischemic cardiomyopathy with LVEF of 50%, peripheral vascular disease, DVT/PE on Eliquis with recent perforated sigmoid diverticulitis and colostomy placement at [facility] on September 26, 2020, who presented today emergency room with generalized weakness and low blood pressure from her rehab facility.  She was admitted for septic shock requiring pressor support.  Septic shock is now resolved.     Significant events: 10/27: Admitted to ICU requiring pressors and fluid resuscitation. 10/28: Weaned off pressors 10/29: TRH picked up.  Transition back to Eliquis from heparin.  Transfer to floor 10/30: Patient agreeable to stop Eliquis.  Feeling weak, PT, OT consult, lower extremity Dopplers negative for DVT.  Sinus tach started on low-dose metoprolol 10/31: May have to wait for SNF due to COVID isolation guidelines.  COVID incidental positive on  11/1:   Hypotensive, checking random cortisol and reduced dose of metoprolol to once daily. 11/4:   BP remains stable. Awaiting completion of quarantine on 11/8.  Subjective: November 16, 2020: Patient seen and examined at bedside stated she is doing well she started to eat she had solids and she is doing well Patient declined SNF. Will need order for HHPT/RN for new colostomy and rolling walker  Assessment/Plan: Principal Problem:   Septic shock ([name]) Active Problems:   Morbid obesity with BMI of 45.0-49.9, adult ([name])   Pulmonary embolism (HCC)   Temporal arteritis (HCC)   Colostomy in place [facility])   AKI (acute kidney injury) ([name])   COVID-19 virus infection   Sinus tachycardia      Septic shock sec. to possible stoma infection: > Resolved.  Resolved patient is now off pressors She remains afebrile No  leukocytosis IV Zosyn has been discontinued You have obvious source of infection other than mild stoma infection on admission which has resolved.   Sinus tachycardia: Heart rate goes up when patient does activity or ambulates. Continue metoprolol 12.5 mg daily.   COVID-19 infection: Patient had COVID-positive in November 07, 2020 She remains asymptomatic with no hypoxia and not requiring any treatment.    Acute kidney injury: > Resolved. Improved with IV hydration.  Serum creatinine 0.93. Avoid nephrotoxic medications.   Colostomy in place: Recent perforated sigmoid diverticulitis s/p open sigmoidectomy and colostomy placement at [name] on 09/26/2020.   Temporal arteritis: Continue prednisone 20 mg daily. Patient has follow-up appointment with [name] @ [facility] neurology.   Pulmonary embolism. PE was diagnosed in 2020, likely provoked in the setting of prolonged immobilization. No further anticoagulation needed.  Patient is agreeable to stop this. It was discussed with [name] who is agreeable to follow-up in 2 to 3 weeks as an outpatient. Lower extremity Doppler negative for DVT. Hemoglobin remained stable.   Morbid obesity Counseled on weight loss.:   Code Status: Full  Severity of Illness: The appropriate patient status for this patient is INPATIENT. Inpatient status is judged to be reasonable and necessary in order to provide the required intensity of service to ensure the patient's safety. The patient's presenting symptoms, physical exam findings, and initial radiographic and laboratory data in the context of their chronic comorbidities is felt to place them at high risk for further clinical deterioration. Furthermore, it is not anticipated that the patient will be medically stable for discharge from the hospital within 2  midnights of admission.  Recent colostomy  * I certify that at the point of admission it is my clinical judgment that the patient will require  inpatient hospital care spanning beyond 2 midnights from the point of admission due to high intensity of service, high risk for further deterioration and high frequency of surveillance required.*   Family Communication: None at bedside  Disposition Plan: SNF Status is: Inpatient   Dispo: The patient is from: Home              Anticipated d/c is to:               Anticipated d/c date is:               Patient currently not medically stable for discharge  Consultants: None  Procedures: None  Antimicrobials: See Rx  DVT prophylaxis: Lovenox   Objective: Vitals:   11/16/20 0600 11/16/20 0835 11/16/20 1128 11/16/20 1636  BP: 111/74 121/71 111/84 119/74  Pulse: 87 87 99 92  Resp:  16 16 17   Temp: 98.1 F (36.7 C) 97.7 F (36.5 C) 98 F (36.7 C) 98.7 F (37.1 C)  TempSrc: Oral     SpO2: 100% 100% 100% 100%  Weight:      Height:        Intake/Output Summary (Last 24 hours) at 11/16/2020 1720 Last data filed at 11/16/2020 1625 Gross per 24 hour  Intake 360 ml  Output 1275 ml  Net -915 ml   Filed Weights   11/11/20 0500 11/12/20 0528 11/16/20 0500  Weight: (!) 142.4 kg (!) 142 kg (!) 141.6 kg   Body mass index is 48.89 kg/m.  Exam:  General: 70 y.o. year-old female well developed well nourished in no acute distress.  Alert and oriented x3.  Obese Cardiovascular: Regular rate and rhythm with no rubs or gallops.  No thyromegaly or JVD noted.   Respiratory: Clear to auscultation with no wheezes or rales. Good inspiratory effort. Abdomen: Soft nontender nondistended with normal bowel sounds x4 quadrants.  Patient has a colostomy and a midline incision that is covered with dressing no drainage noted Musculoskeletal: No lower extremity edema. 2/4 pulses in all 4 extremities. Skin: No ulcerative lesions noted or rashes, Psychiatry: Mood is appropriate for condition and setting Neurology:    Data Reviewed: CBC: Recent Labs  Lab 11/11/20 0559 11/12/20 0422  11/13/20 0453 11/15/20 0725  WBC 7.5 6.6 13.6* 5.5  HGB 8.6* 8.9* 8.9* 8.7*  HCT 27.2* 28.1* 28.2* 29.6*  MCV 89.2 89.2 88.7 91.4  PLT 294 290 318 578   Basic Metabolic Panel: Recent Labs  Lab 11/10/20 0504 11/11/20 0559 11/12/20 0422 11/13/20 0453 11/13/20 0805 11/15/20 0725  NA 139 141 139 140  --  139  K 4.6 4.7 4.6 5.2* 4.4 4.1  CL 107 108 109 114*  --  111  CO2 24 28 23 22   --  21*  GLUCOSE 101* 89 86 104*  --  77  BUN 20 19 20 19   --  23  CREATININE 0.96 1.06* 1.20* 0.93  --  0.99  CALCIUM 9.0 8.8* 8.8* 8.6*  --  8.7*  MG 2.5*  --   --   --   --  2.3  PHOS 4.2  --   --   --   --  4.0   GFR: Estimated Creatinine Clearance: 79.2 mL/min (by C-G formula based on SCr of 0.99 mg/dL). Liver Function Tests: No results for input(s): AST, ALT, ALKPHOS,  BILITOT, PROT, ALBUMIN in the last 168 hours. No results for input(s): LIPASE, AMYLASE in the last 168 hours. No results for input(s): AMMONIA in the last 168 hours. Coagulation Profile: No results for input(s): INR, PROTIME in the last 168 hours. Cardiac Enzymes: No results for input(s): CKTOTAL, CKMB, CKMBINDEX, TROPONINI in the last 168 hours. BNP (last 3 results) No results for input(s): PROBNP in the last 8760 hours. HbA1C: No results for input(s): HGBA1C in the last 72 hours. CBG: Recent Labs  Lab 11/12/20 2133  GLUCAP 119*   Lipid Profile: No results for input(s): CHOL, HDL, LDLCALC, TRIG, CHOLHDL, LDLDIRECT in the last 72 hours. Thyroid Function Tests: No results for input(s): TSH, T4TOTAL, FREET4, T3FREE, THYROIDAB in the last 72 hours. Anemia Panel: No results for input(s): VITAMINB12, FOLATE, FERRITIN, TIBC, IRON, RETICCTPCT in the last 72 hours. Urine analysis:    Component Value Date/Time   COLORURINE YELLOW (A) 11/07/2020 2326   APPEARANCEUR CLEAR (A) 11/07/2020 2326   LABSPEC 1.008 11/07/2020 2326   PHURINE 5.0 11/07/2020 2326   GLUCOSEU NEGATIVE 11/07/2020 2326   HGBUR NEGATIVE 11/07/2020 2326    BILIRUBINUR NEGATIVE 11/07/2020 2326   KETONESUR NEGATIVE 11/07/2020 2326   PROTEINUR NEGATIVE 11/07/2020 2326   NITRITE NEGATIVE 11/07/2020 2326   LEUKOCYTESUR NEGATIVE 11/07/2020 2326   Sepsis Labs: @LABRCNTIP (procalcitonin:4,lacticidven:4)  ) Recent Results (from the past 240 hour(s))  Resp Panel by RT-PCR (Flu A&B, Covid)     Status: Abnormal   Collection Time: 11/07/20 12:24 PM   Specimen: Nasopharyngeal(NP) swabs in vial transport medium  Result Value Ref Range Status   SARS Coronavirus 2 by RT PCR POSITIVE (A) NEGATIVE Final    Comment: RESULT CALLED TO, READ BACK BY AND VERIFIED WITH: NIKKIA VONGH 11/08/20 1357 MU (NOTE) SARS-CoV-2 target nucleic acids are DETECTED.  The SARS-CoV-2 RNA is generally detectable in upper respiratory specimens during the acute phase of infection. Positive results are indicative of the presence of the identified virus, but do not rule out bacterial infection or co-infection with other pathogens not detected by the test. Clinical correlation with patient history and other diagnostic information is necessary to determine patient infection status. The expected result is Negative.  Fact Sheet for Patients: EntrepreneurPulse.com.au  Fact Sheet for Healthcare Providers: IncredibleEmployment.be  This test is not yet approved or cleared by the [place] FDA and  has been authorized for detection and/or diagnosis of SARS-CoV-2 by FDA under an Emergency Use Authorization (EUA).  This EUA will remain in effect (meaning this test can be use d) for the duration of  the COVID-19 declaration under Section 564(b)(1) of the Act, 21 U.S.C. section 360bbb-3(b)(1), unless the authorization is terminated or revoked sooner.     Influenza A by PCR NEGATIVE NEGATIVE Final   Influenza B by PCR NEGATIVE NEGATIVE Final    Comment: (NOTE) The Xpert Xpress SARS-CoV-2/FLU/RSV plus assay is intended as an aid in the  diagnosis of influenza from Nasopharyngeal swab specimens and should not be used as a sole basis for treatment. Nasal washings and aspirates are unacceptable for Xpert Xpress SARS-CoV-2/FLU/RSV testing.  Fact Sheet for Patients: EntrepreneurPulse.com.au  Fact Sheet for Healthcare Providers: IncredibleEmployment.be  This test is not yet approved or cleared by the [place] FDA and has been authorized for detection and/or diagnosis of SARS-CoV-2 by FDA under an Emergency Use Authorization (EUA). This EUA will remain in effect (meaning this test can be used) for the duration of the COVID-19 declaration under Section 564(b)(1) of the Act, 21  U.S.C. section 360bbb-3(b)(1), unless the authorization is terminated or revoked.  Performed at [facility], [address] [place], [place] [address]   Blood culture (routine single)     Status: None   Collection Time: 11/07/20  7:12 PM   Specimen: BLOOD  Result Value Ref Range Status   Specimen Description BLOOD LEFT ANTECUBITAL  Final   Special Requests   Final    BOTTLES DRAWN AEROBIC AND ANAEROBIC Blood Culture adequate volume   Culture   Final    NO GROWTH 5 DAYS Performed at [facility], [address] [place], [place] [address]    Report Status 11/12/2020 FINAL  Final  Urine Culture     Status: None   Collection Time: 11/07/20 11:26 PM   Specimen: Urine, Random  Result Value Ref Range Status   Specimen Description   Final    URINE, RANDOM Performed at [facility], [address], [place], [place] [address]    Special Requests   Final    NONE Performed at [facility], [address] [place], [place] [address]    Culture   Final    NO GROWTH Performed at [facility], [place] [address], [place], [place] [address]    Report Status 11/08/2020 FINAL  Final  C Difficile Quick Screen w PCR reflex     Status: None   Collection Time: 11/08/20  12:16 AM   Specimen: STOOL  Result Value Ref Range Status   C Diff antigen NEGATIVE NEGATIVE Final   C Diff toxin NEGATIVE NEGATIVE Final   C Diff interpretation No C. difficile detected.  Final    Comment: Performed at [facility], [address] [place], [place] [address]  MRSA Next Gen by PCR, Nasal     Status: None   Collection Time: 11/08/20  1:35 AM   Specimen: Nasal Mucosa; Nasal Swab  Result Value Ref Range Status   MRSA by PCR Next Gen NOT DETECTED NOT DETECTED Final    Comment: (NOTE) The GeneXpert MRSA Assay (FDA approved for NASAL specimens only), is one component of a comprehensive MRSA colonization surveillance program. It is not intended to diagnose MRSA infection nor to guide or monitor treatment for MRSA infections. Test performance is not FDA approved in patients less than 30 years old. Performed at [facility], [address] [place],  [address]   Gastrointestinal Panel by PCR , Stool     Status: None   Collection Time: 11/08/20  9:55 AM   Specimen: Stool  Result Value Ref Range Status   Campylobacter species NOT DETECTED NOT DETECTED Final   Plesimonas shigelloides NOT DETECTED NOT DETECTED Final   Salmonella species NOT DETECTED NOT DETECTED Final   Yersinia enterocolitica NOT DETECTED NOT DETECTED Final   Vibrio species NOT DETECTED NOT DETECTED Final   Vibrio cholerae NOT DETECTED NOT DETECTED Final   Enteroaggregative E coli (EAEC) NOT DETECTED NOT DETECTED Final   Enteropathogenic E coli (EPEC) NOT DETECTED NOT DETECTED Final   Enterotoxigenic E coli (ETEC) NOT DETECTED NOT DETECTED Final   Shiga like toxin producing E coli (STEC) NOT DETECTED NOT DETECTED Final   Shigella/Enteroinvasive E coli (EIEC) NOT DETECTED NOT DETECTED Final   Cryptosporidium NOT DETECTED NOT DETECTED Final   Cyclospora cayetanensis NOT DETECTED NOT DETECTED Final   Entamoeba histolytica NOT DETECTED NOT DETECTED Final   Giardia lamblia NOT  DETECTED NOT DETECTED Final   Adenovirus F40/41 NOT DETECTED NOT DETECTED  Final   Astrovirus NOT DETECTED NOT DETECTED Final   Norovirus GI/GII NOT DETECTED NOT DETECTED Final   Rotavirus A NOT DETECTED NOT DETECTED Final   Sapovirus (I, II, IV, and V) NOT DETECTED NOT DETECTED Final    Comment: Performed at [facility], [address] [place], [place] [address]  Respiratory (~20 pathogens) panel by PCR     Status: None   Collection Time: 11/08/20 12:24 PM   Specimen: Nasopharyngeal Swab; Respiratory  Result Value Ref Range Status   Adenovirus NOT DETECTED NOT DETECTED Final   Coronavirus 229E NOT DETECTED NOT DETECTED Final    Comment: (NOTE) The Coronavirus on the Respiratory Panel, DOES NOT test for the novel  Coronavirus (2019 nCoV)    Coronavirus HKU1 NOT DETECTED NOT DETECTED Final   Coronavirus NL63 NOT DETECTED NOT DETECTED Final   Coronavirus OC43 NOT DETECTED NOT DETECTED Final   Metapneumovirus NOT DETECTED NOT DETECTED Final   Rhinovirus / Enterovirus NOT DETECTED NOT DETECTED Final   Influenza A NOT DETECTED NOT DETECTED Final   Influenza B NOT DETECTED NOT DETECTED Final   Parainfluenza Virus 1 NOT DETECTED NOT DETECTED Final   Parainfluenza Virus 2 NOT DETECTED NOT DETECTED Final   Parainfluenza Virus 3 NOT DETECTED NOT DETECTED Final   Parainfluenza Virus 4 NOT DETECTED NOT DETECTED Final   Respiratory Syncytial Virus NOT DETECTED NOT DETECTED Final   Bordetella pertussis NOT DETECTED NOT DETECTED Final   Bordetella Parapertussis NOT DETECTED NOT DETECTED Final   Chlamydophila pneumoniae NOT DETECTED NOT DETECTED Final   Mycoplasma pneumoniae NOT DETECTED NOT DETECTED Final    Comment: Performed at [facility], [place]. [address], [place], [place] [address]  SARS CORONAVIRUS 2 (TAT 6-24 HRS) Nasopharyngeal Nasopharyngeal Swab     Status: Abnormal   Collection Time: 11/11/20 12:23 PM   Specimen: Nasopharyngeal Swab  Result Value Ref Range Status    SARS Coronavirus 2 POSITIVE (A) NEGATIVE Final    Comment: (NOTE) SARS-CoV-2 target nucleic acids are DETECTED.  The SARS-CoV-2 RNA is generally detectable in upper and lower respiratory specimens during the acute phase of infection. Positive results are indicative of the presence of SARS-CoV-2 RNA. Clinical correlation with patient history and other diagnostic information is  necessary to determine patient infection status. Positive results do not rule out bacterial infection or co-infection with other viruses.  The expected result is Negative.  Fact Sheet for Patients: SugarRoll.be  Fact Sheet for Healthcare Providers: https://www.woods-mathews.com/  This test is not yet approved or cleared by the [place] FDA and  has been authorized for detection and/or diagnosis of SARS-CoV-2 by FDA under an Emergency Use Authorization (EUA). This EUA will remain  in effect (meaning this test can be used) for the duration of the COVID-19 declaration under Section 564(b)(1) of the Act, 21 U. S.C. section 360bbb-3(b)(1), unless the authorization is terminated or revoked sooner.   Performed at [facility], [place] [address] [place], [place] [address]       Studies: No results found.  Scheduled Meds:  Chlorhexidine Gluconate Cloth  6 each Topical Daily   enoxaparin (LOVENOX) injection  0.5 mg/kg Subcutaneous Y92K   folic acid  1 mg Oral Daily   gabapentin  600 mg Oral TID   guaiFENesin-dextromethorphan  5 mL Oral Q6H   metoprolol tartrate  12.5 mg Oral Daily   montelukast  10 mg Oral QHS   pantoprazole  40 mg Oral BID   predniSONE  20  mg Oral Q breakfast   umeclidinium-vilanterol  1 puff Inhalation Daily    Continuous Infusions:  sodium chloride       LOS: 9 days     Cristal Deer, MD [facility]  To reach me or the doctor on call, go to: [email or web address] Password [email or web address]  11/16/2020, 5:20 PM

## 2020-11-17 DIAGNOSIS — A419 Sepsis, unspecified organism: Secondary | ICD-10-CM | POA: Diagnosis not present

## 2020-11-17 DIAGNOSIS — R6521 Severe sepsis with septic shock: Secondary | ICD-10-CM | POA: Diagnosis not present

## 2020-11-17 MED ORDER — ACETAMINOPHEN 325 MG PO TABS
650.0000 mg | ORAL_TABLET | Freq: Four times a day (QID) | ORAL | 0 refills | Status: DC | PRN
Start: 2020-11-17 — End: 2023-07-14

## 2020-11-17 MED ORDER — POLYETHYLENE GLYCOL 3350 17 G PO PACK: 17.0000 g | PACK | Freq: Every day | ORAL | 0 refills | Status: DC | PRN

## 2020-11-17 MED ORDER — ZINC SULFATE 220 (50 ZN) MG PO CAPS: 220.0000 mg | ORAL_CAPSULE | Freq: Every day | ORAL | 0 refills | Status: AC

## 2020-11-17 MED ORDER — IBUPROFEN 600 MG PO TABS
600.0000 mg | ORAL_TABLET | Freq: Four times a day (QID) | ORAL | 0 refills | Status: DC | PRN
Start: 2020-11-17 — End: 2021-02-08

## 2020-11-17 MED ORDER — METOPROLOL TARTRATE 25 MG PO TABS: 12.5000 mg | ORAL_TABLET | Freq: Every day | ORAL | 0 refills | Status: DC

## 2020-11-17 NOTE — TOC Transition Note (Signed)
Transition of Care [facility]) - CM/SW Discharge Note   Patient Details  Name: Wendy Sanchez MRN: [id number] Date of Birth: December 24, 1950  Transition [facility]) CM/SW Contact:  Harriet Masson, RN Phone Number:[phone] 11/17/2020, 2:59 PM   Clinical Narrative:    Spoke with pt on the HHPT/RN for colostomy teaching and confirmed discharged with the attending today. [facility] will deliver the RW to the pt's room and spoke with Corene Cornea [facility] for RN/PT services. No other request at this time as pt's son [name]) will be transporting today. Pt confirmed her discharge address is [place] [employer]. Both [facility] and [facility] made aware of the address.   No other needs presented at this time. Pt to be discharged today.   Final next level of care: [place] Barriers to Discharge: Barriers Resolved   Patient Goals and CMS Choice        Discharge Placement                    Patient and family notified of of transfer:  (Spoke with the pt directly)  Discharge Plan and Services                DME Arranged: [place] rolling DME Agency: [facility] Date DME Agency Contacted: 11/17/20 Time DME Agency Contacted: [phone] Representative spoke with at DME Agency: [place]: PT, RN [place] Agency: [place] ([place]) Date [place]: 11/17/20 Time [place]: 1450 Representative spoke with at [place]: [place] ([place]) Interventions     Readmission Risk Interventions No flowsheet data found.

## 2020-11-17 NOTE — Discharge Summary (Signed)
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    Discharge Condition: Improved  Diet recommendation: Cardiac  Vitals:   11/17/20 0406 11/17/20 0840  BP: 117/73 111/69  Pulse: 85 82  Resp: 17 18  Temp: 97.6 F (36.4 C) 97.8 F (36.6 C)  SpO2: 100% 100%    History of present illness:  This is a 70 year old female former smoker with COPD nonischemic heart disease nonischemic cardiomyopathy with LVEF of 50%, peripheral vascular disease, DVT/PE on Eliquis with recent perforated sigmoid diverticulitis and colostomy placement at [facility] on September 26, 2020, who presented today emergency room with generalized weakness and low blood pressure from her rehab facility.  She was admitted for septic shock requiring pressor support.  Septic shock is now resolved.  Hospital Course:  Principal Problem:   Septic shock [facility]) Active Problems:   Morbid obesity with BMI of 45.0-49.9, adult ([place])   Pulmonary embolism (HCC)   Temporal arteritis (H[facility])   Colostomy in place [facility])   AKI (acute kidney injury) ([place])   COVID-19 virus infection   Sinus tachycardia This is a 70 year old female former smoker with COPD nonischemic heart disease nonischemic cardiomyopathy with LVEF of 50%, peripheral vascular disease, DVT/PE on Eliquis  with recent perforated sigmoid diverticulitis and colostomy placement at [facility] on September 26, 2020, who presented today emergency room with generalized weakness and low blood pressure from her rehab facility.  She was admitted for septic shock requiring pressor support.  Septic shock is now resolved. Vital signs are stable.  She was to go to SNF but she declined SNF so she is agreeable to go home with home health and home health nurse due to her new colostomy.  Also she was ordered a rolling walker.     Significant events: 10/27: Admitted to ICU requiring pressors and fluid resuscitation. 10/28: Weaned off pressors 10/29: TRH picked up.  Transition back to Eliquis from heparin.  Transfer to floor 10/30: Patient agreeable to stop Eliquis.  Feeling weak, PT, OT consult, lower extremity Dopplers negative for DVT.  Sinus tach started on low-dose metoprolol 10/31: May have to wait for SNF due to COVID isolation guidelines.  COVID incidental positive on  11/1:   Hypotensive, checking random cortisol and reduced dose of metoprolol to once daily. 11/4:   BP remains stable. Awaiting completion of quarantine on 11/8.    Procedures: None  Consultations: None  Discharge Exam: BP 111/69 (BP Location: Right Arm)   Pulse 82   Temp 97.8 F (36.6 C) (Oral)   Resp 18   Ht 5\' 7"  (1.702 m)   Wt (!) 141.7 kg   SpO2 100%   BMI 48.93 kg/m   General: Pleasant alert oriented x3 no distress Cardiovascular: Regular rate and rhythm Respiratory: Clear to auscultation bilaterally abdomen is soft nondistended patient has a colostomy and  a midline incision that is covered with a dressing no drainage noted colostomy is working well  Discharge Instructions You were cared for by a hospitalist during your hospital stay. If you have any questions about your discharge medications or the care you received while you were in the hospital after you are discharged, you can call the unit and asked to speak with  the hospitalist on call if the hospitalist that took care of you is not available. Once you are discharged, your primary care physician will handle any further medical issues. Please note that NO REFILLS for any discharge medications will be authorized once you are discharged, as it is imperative that you return to your primary care physician (or establish a relationship with a primary care physician if you do not have one) for your aftercare needs so that they can reassess your need for medications and monitor your lab values.  Discharge Instructions     Diet - low sodium heart healthy   Complete by: As directed    Discharge instructions   Complete by: As directed    Follow-up with PCP in 1 to 3 weeks   Discharge wound care:   Complete by: As directed    Per nursing/home health   Discharge wound care:   Complete by: As directed    By home health nursing   Increase activity slowly   Complete by: As directed    Increase activity slowly   Complete by: As directed       Allergies as of 11/17/2020       Reactions   Hydrocodone-acetaminophen Swelling   hands   Iodine Anaphylaxis, Swelling   Iv dye   Other Other (See Comments)   ALLERGY TO METAL - BLACKENS SKIN AND CAUSES A RASH    Tape Swelling, Other (See Comments)   Skin comes off.  Paper tape is ok tegaderm OK   Peanut Oil Other (See Comments)   ** Nuts cause runny nose   Shellfish Allergy Nausea And Vomiting, Swelling   Episode of GI infection after eating clam chowder. Still eats shrimp and other seafood        Medication List     STOP taking these medications    cephALEXin 500 MG capsule Commonly known as: KEFLEX   nystatin 100000 UNIT/ML suspension Commonly known as: MYCOSTATIN   WHEAT DEXTRIN PO       TAKE these medications    acetaminophen 325 MG tablet Commonly known as: TYLENOL Take 2 tablets (650 mg total) by mouth every 6 (six) hours as needed for mild pain or fever.   acidophilus Caps  capsule Take 1 capsule by mouth in the morning.   albuterol 108 (90 Base) MCG/ACT inhaler Commonly known as: VENTOLIN HFA Inhale 2 puffs into the lungs every 6 (six) hours as needed for wheezing or shortness of breath.   alendronate 35 MG tablet Commonly known as: FOSAMAX Take 35 mg by mouth every Saturday. Take with a full glass of water on an empty stomach.   apixaban 5 MG Tabs tablet Commonly known as: ELIQUIS Take 1 tablet (5 mg total) by mouth 2 (two) times daily.   ascorbic acid 500 MG tablet Commonly known as: VITAMIN C Take 500 mg by mouth 2 (two) times daily.   azelastine 0.1 % nasal spray Commonly known as: ASTELIN Place 1 spray into both nostrils 2 (two) times daily.   calcium carbonate 1250 (500 Ca) MG tablet Commonly known as: OS-CAL - dosed in mg  of elemental calcium Take 2 tablets by mouth in the morning.   diclofenac sodium 1 % Gel Commonly known as: VOLTAREN Apply 2 g topically in the morning. (Apply to most painful area)   diphenhydrAMINE 25 mg capsule Commonly known as: BENADRYL Take 25 mg by mouth every 6 (six) hours as needed for itching or allergies.   docusate sodium 100 MG capsule Commonly known as: COLACE Take 200 mg by mouth at bedtime as needed for mild constipation.   folic acid 1 MG tablet Commonly known as: FOLVITE Take 1 mg by mouth daily.   gabapentin 600 MG tablet Commonly known as: NEURONTIN Take 600 mg by mouth 3 (three) times daily. What changed: Another medication with the same name was removed. Continue taking this medication, and follow the directions you see here.   ibuprofen 600 MG tablet Commonly known as: ADVIL Take 1 tablet (600 mg total) by mouth every 6 (six) hours as needed for moderate pain (severe pain).   ipratropium-albuterol 0.5-2.5 (3) MG/3ML Soln Commonly known as: DUONEB Take 3 mLs by nebulization every 6 (six) hours as needed (shortness of breath).   levocetirizine 5 MG tablet Commonly known as:  XYZAL Take 5 mg by mouth every evening.   magnesium oxide 400 MG tablet Commonly known as: MAG-OX Take 800 mg by mouth daily.   metoprolol tartrate 25 MG tablet Commonly known as: LOPRESSOR Take 0.5 tablets (12.5 mg total) by mouth daily. Start taking on: November 18, 2020   montelukast 10 MG tablet Commonly known as: SINGULAIR Take 10 mg by mouth at bedtime.   multivitamin with minerals tablet Take 1 tablet by mouth daily.   nortriptyline 50 MG capsule Commonly known as: PAMELOR Take 50 mg by mouth at bedtime.   nystatin cream Commonly known as: MYCOSTATIN Apply 1 application topically 2 (two) times daily. (Apply under skin folds)   oxyCODONE-acetaminophen 10-325 MG tablet Commonly known as: Percocet Take 1 tablet by mouth every 4 (four) hours as needed for pain. Start taking on: November 26, 2020 What changed: Another medication with the same name was removed. Continue taking this medication, and follow the directions you see here.   pantoprazole 40 MG tablet Commonly known as: PROTONIX Take 40 mg by mouth 2 (two) times daily.   polyethylene glycol 17 g packet Commonly known as: MIRALAX / GLYCOLAX Take 17 g by mouth daily as needed for moderate constipation.   predniSONE 10 MG tablet Commonly known as: DELTASONE Take 20 mg by mouth in the morning.   sacubitril-valsartan 24-26 MG Commonly known as: ENTRESTO Take 1 tablet by mouth 2 (two) times daily.   spironolactone 25 MG tablet Commonly known as: ALDACTONE Take 12.5 mg by mouth daily.   sucralfate 1 g tablet Commonly known as: CARAFATE Take 1 g by mouth 2 (two) times daily before a meal.   torsemide 20 MG tablet Commonly known as: DEMADEX Take 2 tablets (40 mg) by mouth once daily What changed:  how much to take how to take this when to take this additional instructions   umeclidinium-vilanterol 62.5-25 MCG/INH Aepb Commonly known as: ANORO ELLIPTA Inhale 1 puff into the lungs daily.   Vashe  Wound Therapy Soln Apply 1 application topically daily. (Irrigate abdominal wound)   Vitamin D (Ergocalciferol) 1.25 MG (50000 UNIT) Caps capsule Commonly known as: DRISDOL Take 50,000 Units by mouth every Monday.   zinc sulfate 220 (50 Zn) MG capsule Take 1 capsule (220 mg total) by mouth daily for 14 days.  Durable Medical Equipment  (From admission, onward)           Start     Ordered   11/17/20 1508  DME Walker  Once       Question Answer Comment  Walker: With [place] Wheels   Patient needs a walker to treat with the following condition Generalized weakness   Patient needs a walker to treat with the following condition Pain      11/17/20 1539   11/17/20 1401  DME Walker  Once       Question Answer Comment  Walker: With [place] Wheels   Patient needs a walker to treat with the following condition SOB (shortness of breath)   Patient needs a walker to treat with the following condition Generalized weakness      11/17/20 1402              Discharge Care Instructions  (From admission, onward)           Start     Ordered   11/17/20 0000  Discharge wound care:       Comments: Per nursing/home health   11/17/20 1402   11/17/20 0000  Discharge wound care:       Comments: By home health nursing   11/17/20 1539           Allergies  Allergen Reactions   Hydrocodone-Acetaminophen Swelling    hands   Iodine Anaphylaxis and Swelling    Iv dye   Other Other (See Comments)    ALLERGY TO METAL - BLACKENS SKIN AND CAUSES A RASH    Tape Swelling and Other (See Comments)    Skin comes off.  Paper tape is ok tegaderm OK   Peanut Oil Other (See Comments)    ** Nuts cause runny nose   Shellfish Allergy Nausea And Vomiting and Swelling    Episode of GI infection after eating clam chowder. Still eats shrimp and other seafood      The results of significant diagnostics from this hospitalization (including imaging, microbiology, ancillary and  laboratory) are listed below for reference.    Significant Diagnostic Studies: US RENAL  Result Date: 11/08/2020 CLINICAL DATA:  70 year old female with history of acute kidney injury. EXAM: RENAL / URINARY TRACT ULTRASOUND COMPLETE COMPARISON:  No priors. FINDINGS: Right Kidney: Renal measurements: 11.7 x 3.9 x 5.8 cm = volume: 138 mL. Echogenicity within normal limits. No mass or hydronephrosis visualized. Left Kidney: Renal measurements: 10.9 x 5.9 x 6.1 cm = volume: 206 mL. Echogenicity within normal limits. In the lower pole of the left kidney there is a partially exophytic anechoic lesion with increased through transmission measuring 2.6 x 2.3 x 2.3 cm, compatible with a simple cyst. Bladder: Appears normal for degree of bladder distention. Other: None. IMPRESSION: 1. No acute findings.  Specifically, no hydronephrosis. 2. 2.6 x 2.3 x 2.3 cm simple cyst in the lower pole of the left kidney. Electronically Signed   By: Vinnie Langton M.D.   On: 11/08/2020 05:17   US Venous Img Lower Bilateral (DVT)  Result Date: 11/10/2020 CLINICAL DATA:  Pain and swelling EXAM: Bilateral LOWER EXTREMITY VENOUS DOPPLER ULTRASOUND TECHNIQUE: Gray-scale sonography with compression, as well as color and duplex ultrasound, were performed to evaluate the deep venous system(s) from the level of the common femoral vein through the popliteal and proximal calf veins. COMPARISON:  07/14/2018 FINDINGS: According to the note by the technologist, examination was technically difficult due to patient's body habitus.  VENOUS Normal compressibility of the common femoral, superficial femoral, and popliteal veins, as well as the visualized calf veins. Visualized portions of profunda femoral vein and great saphenous vein unremarkable. No filling defects to suggest DVT on grayscale or color Doppler imaging. Doppler waveforms show normal direction of venous flow, normal respiratory plasticity and response to augmentation. There is  interval resolution of deep venous thrombosis in right popliteal vein since the previous study. OTHER None. Limitations: none IMPRESSION: There is no evidence of deep venous thrombosis in both lower extremities. Electronically Signed   By: Elmer Picker M.D.   On: 11/10/2020 13:34   DG Chest Port 1 View  Result Date: 11/07/2020 CLINICAL DATA:  Weakness and fever. EXAM: PORTABLE CHEST 1 VIEW COMPARISON:  April 18, 2020 FINDINGS: The heart size and mediastinal contours are within normal limits. There is mild calcification of the aortic arch. Both lungs are clear. Radiopaque surgical clips are seen within the right upper quadrant. The visualized skeletal structures are unremarkable. IMPRESSION: No active disease. Electronically Signed   By: Virgina Norfolk M.D.   On: 11/07/2020 19:43   ECHOCARDIOGRAM COMPLETE BUBBLE STUDY  Result Date: 11/16/2020    ECHOCARDIOGRAM REPORT   Patient Name:   Wendy Sanchez Date of Exam: 11/10/2020 Medical Rec #:  [id number]       Height:       67.0 in Accession #:    [id number]      Weight:       315.3 lb Date of Birth:  1950/06/24       BSA:          2.454 m Patient Age:    86 years        BP:           107/64 mmHg Patient Gender: F               HR:           97 bpm. Exam Location:  [facility] Procedure: Saline Contrast Bubble Study and 2D Echo Indications:     Systolic Heart Failure  History:         Patient has prior history of Echocardiogram examinations. CHF                  and Non Ischemic CM, COPD; Risk Factors:Sleep Apnea and Morbid                  Obesity.  Sonographer:     L Thornton-Maynard Referring Phys:  [id number] ADAM ROSS [place] Diagnosing Phys: [place]  1. Left ventricular ejection fraction, by estimation, is 60 to 65%. The left ventricle has normal function. The left ventricle has no regional wall motion abnormalities. Left ventricular diastolic parameters are consistent with Grade I diastolic dysfunction (impaired relaxation).  2. Right  ventricular systolic function is normal. The right ventricular size is normal. There is normal pulmonary artery systolic pressure.  3. The mitral valve is normal in structure. No evidence of mitral valve regurgitation. No evidence of mitral stenosis.  4. The aortic valve is normal in structure. Aortic valve regurgitation is not visualized. No aortic stenosis is present.  5. The inferior vena cava is normal in size with greater than 50% respiratory variability, suggesting right atrial pressure of 3 mmHg. FINDINGS  Left Ventricle: Left ventricular ejection fraction, by estimation, is 60 to 65%. The left ventricle has normal function. The left ventricle has no regional wall motion abnormalities. The left ventricular internal cavity size  was normal in size. There is  no left ventricular hypertrophy. Left ventricular diastolic parameters are consistent with Grade I diastolic dysfunction (impaired relaxation). Right Ventricle: The right ventricular size is normal. No increase in right ventricular wall thickness. Right ventricular systolic function is normal. There is normal pulmonary artery systolic pressure. The tricuspid regurgitant velocity is 2.51 m/s, and  with an assumed right atrial pressure of 3 mmHg, the estimated right ventricular systolic pressure is 27.0 mmHg. Left Atrium: Left atrial size was normal in size. Right Atrium: Right atrial size was normal in size. Pericardium: There is no evidence of pericardial effusion. Mitral Valve: The mitral valve is normal in structure. No evidence of mitral valve regurgitation. No evidence of mitral valve stenosis. MV peak gradient, 5.6 mmHg. The mean mitral valve gradient is 3.0 mmHg. Tricuspid Valve: The tricuspid valve is normal in structure. Tricuspid valve regurgitation is not demonstrated. No evidence of tricuspid stenosis. Aortic Valve: The aortic valve is normal in structure. Aortic valve regurgitation is not visualized. No aortic stenosis is present. Aortic valve  mean gradient measures 8.0 mmHg. Aortic valve peak gradient measures 16.3 mmHg. Aortic valve area, by VTI measures 2.08 cm. Pulmonic Valve: The pulmonic valve was normal in structure. Pulmonic valve regurgitation is not visualized. No evidence of pulmonic stenosis. Aorta: The aortic root is normal in size and structure. Venous: The inferior vena cava is normal in size with greater than 50% respiratory variability, suggesting right atrial pressure of 3 mmHg. IAS/Shunts: No atrial level shunt detected by color flow Doppler. Agitated saline contrast was given intravenously to evaluate for intracardiac shunting.  LEFT VENTRICLE PLAX 2D LVIDd:         4.90 cm   Diastology LVIDs:         2.70 cm   LV e' medial:    6.20 cm/s LV PW:         1.00 cm   LV E/e' medial:  14.5 LV IVS:        1.10 cm   LV e' lateral:   5.55 cm/s LVOT diam:     2.30 cm   LV E/e' lateral: 16.2 LV SV:         62 LV SV Index:   25 LVOT Area:     4.15 cm  RIGHT VENTRICLE RV S prime:     14.90 cm/s TAPSE (M-mode): 2.1 cm LEFT ATRIUM           Index LA diam:      2.70 cm 1.10 cm/m LA Vol (A2C): 33.4 ml 13.61 ml/m LA Vol (A4C): 41.1 ml 16.75 ml/m  AORTIC VALVE                     PULMONIC VALVE AV Area (Vmax):    1.99 cm      PV Vmax:       1.03 m/s AV Area (Vmean):   2.14 cm      PV Peak grad:  4.2 mmHg AV Area (VTI):     2.08 cm AV Vmax:           202.00 cm/s AV Vmean:          138.000 cm/s AV VTI:            0.300 m AV Peak Grad:      16.3 mmHg AV Mean Grad:      8.0 mmHg LVOT Vmax:         96.60 cm/s LVOT Vmean:  71.000 cm/s LVOT VTI:          0.150 m LVOT/AV VTI ratio: 0.50  AORTA Ao Root diam: 3.80 cm MITRAL VALVE               TRICUSPID VALVE MV Area VTI:  2.41 cm     TR Peak grad:   25.2 mmHg MV Peak grad: 5.6 mmHg     TR Vmax:        251.00 cm/s MV Mean grad: 3.0 mmHg MV Vmax:      1.18 m/s     SHUNTS MV Vmean:     85.3 cm/s    Systemic VTI:  0.15 m MV E velocity: 90.00 cm/s  Systemic Diam: 2.30 cm MV A velocity: 96.00 cm/s MV E/A  ratio:  0.94 Shaukat Khan Electronically signed by Neoma Laming Signature Date/Time: 11/16/2020/12:09:07 PM    Final     Microbiology: Recent Results (from the past 240 hour(s))  Blood culture (routine single)     Status: None   Collection Time: 11/07/20  7:12 PM   Specimen: BLOOD  Result Value Ref Range Status   Specimen Description BLOOD LEFT ANTECUBITAL  Final   Special Requests   Final    BOTTLES DRAWN AEROBIC AND ANAEROBIC Blood Culture adequate volume   Culture   Final    NO GROWTH 5 DAYS Performed at [facility], [address], [place], [place] [address]    Report Status 11/12/2020 FINAL  Final  Urine Culture     Status: None   Collection Time: 11/07/20 11:26 PM   Specimen: Urine, Random  Result Value Ref Range Status   Specimen Description   Final    URINE, RANDOM Performed at [facility], [address], [place], [place] [address]    Special Requests   Final    NONE Performed at [facility], [address] [place], [place] [address]    Culture   Final    NO GROWTH Performed at [facility], [place] [address] [place], [place] [address]    Report Status 11/08/2020 FINAL  Final  C Difficile Quick Screen w PCR reflex     Status: None   Collection Time: 11/08/20 12:16 AM   Specimen: STOOL  Result Value Ref Range Status   C Diff antigen NEGATIVE NEGATIVE Final   C Diff toxin NEGATIVE NEGATIVE Final   C Diff interpretation No C. difficile detected.  Final    Comment: Performed at [facility], [address] [place], [place] [address]  MRSA Next Gen by PCR, Nasal     Status: None   Collection Time: 11/08/20  1:35 AM   Specimen: Nasal Mucosa; Nasal Swab  Result Value Ref Range Status   MRSA by PCR Next Gen NOT DETECTED NOT DETECTED Final    Comment: (NOTE) The GeneXpert MRSA Assay (FDA approved for NASAL specimens only), is one component of a comprehensive MRSA colonization surveillance program. It is not intended to  diagnose MRSA infection nor to guide or monitor treatment for MRSA infections. Test performance is not FDA approved in patients less than 81 years old. Performed at [facility], [address] [place], [place] [address]   Gastrointestinal Panel by PCR , Stool     Status: None   Collection Time: 11/08/20  9:55 AM   Specimen: Stool  Result Value Ref Range Status   Campylobacter species NOT DETECTED NOT DETECTED Final   Plesimonas shigelloides NOT DETECTED NOT  DETECTED Final   Salmonella species NOT DETECTED NOT DETECTED Final   Yersinia enterocolitica NOT DETECTED NOT DETECTED Final   Vibrio species NOT DETECTED NOT DETECTED Final   Vibrio cholerae NOT DETECTED NOT DETECTED Final   Enteroaggregative E coli (EAEC) NOT DETECTED NOT DETECTED Final   Enteropathogenic E coli (EPEC) NOT DETECTED NOT DETECTED Final   Enterotoxigenic E coli (ETEC) NOT DETECTED NOT DETECTED Final   Shiga like toxin producing E coli (STEC) NOT DETECTED NOT DETECTED Final   Shigella/Enteroinvasive E coli (EIEC) NOT DETECTED NOT DETECTED Final   Cryptosporidium NOT DETECTED NOT DETECTED Final   Cyclospora cayetanensis NOT DETECTED NOT DETECTED Final   Entamoeba histolytica NOT DETECTED NOT DETECTED Final   Giardia lamblia NOT DETECTED NOT DETECTED Final   Adenovirus F40/41 NOT DETECTED NOT DETECTED Final   Astrovirus NOT DETECTED NOT DETECTED Final   Norovirus GI/GII NOT DETECTED NOT DETECTED Final   Rotavirus A NOT DETECTED NOT DETECTED Final   Sapovirus (I, II, IV, and V) NOT DETECTED NOT DETECTED Final    Comment: Performed at [facility], [place] [place], [place] [address]  Respiratory (~20 pathogens) panel by PCR     Status: None   Collection Time: 11/08/20 12:24 PM   Specimen: Nasopharyngeal Swab; Respiratory  Result Value Ref Range Status   Adenovirus NOT DETECTED NOT DETECTED Final   Coronavirus 229E NOT DETECTED NOT DETECTED Final    Comment: (NOTE) The Coronavirus on  the Respiratory Panel, DOES NOT test for the novel  Coronavirus (2019 nCoV)    Coronavirus HKU1 NOT DETECTED NOT DETECTED Final   Coronavirus NL63 NOT DETECTED NOT DETECTED Final   Coronavirus OC43 NOT DETECTED NOT DETECTED Final   Metapneumovirus NOT DETECTED NOT DETECTED Final   Rhinovirus / Enterovirus NOT DETECTED NOT DETECTED Final   Influenza A NOT DETECTED NOT DETECTED Final   Influenza B NOT DETECTED NOT DETECTED Final   Parainfluenza Virus 1 NOT DETECTED NOT DETECTED Final   Parainfluenza Virus 2 NOT DETECTED NOT DETECTED Final   Parainfluenza Virus 3 NOT DETECTED NOT DETECTED Final   Parainfluenza Virus 4 NOT DETECTED NOT DETECTED Final   Respiratory Syncytial Virus NOT DETECTED NOT DETECTED Final   Bordetella pertussis NOT DETECTED NOT DETECTED Final   Bordetella Parapertussis NOT DETECTED NOT DETECTED Final   Chlamydophila pneumoniae NOT DETECTED NOT DETECTED Final   Mycoplasma pneumoniae NOT DETECTED NOT DETECTED Final    Comment: Performed at [facility], [place]. [address], [place], [place] [address]  SARS CORONAVIRUS 2 (TAT 6-24 HRS) Nasopharyngeal Nasopharyngeal Swab     Status: Abnormal   Collection Time: 11/11/20 12:23 PM   Specimen: Nasopharyngeal Swab  Result Value Ref Range Status   SARS Coronavirus 2 POSITIVE (A) NEGATIVE Final    Comment: (NOTE) SARS-CoV-2 target nucleic acids are DETECTED.  The SARS-CoV-2 RNA is generally detectable in upper and lower respiratory specimens during the acute phase of infection. Positive results are indicative of the presence of SARS-CoV-2 RNA. Clinical correlation with patient history and other diagnostic information is  necessary to determine patient infection status. Positive results do not rule out bacterial infection or co-infection with other viruses.  The expected result is Negative.  Fact Sheet for Patients: SugarRoll.be  Fact Sheet for Healthcare  Providers: https://www.woods-mathews.com/  This test is not yet approved or cleared by the [place] FDA and  has been authorized for detection and/or diagnosis of SARS-CoV-2 by FDA under an Emergency Use Authorization (EUA). This  EUA will remain  in effect (meaning this test can be used) for the duration of the COVID-19 declaration under Section 564(b)(1) of the Act, 21 U. S.C. section 360bbb-3(b)(1), unless the authorization is terminated or revoked sooner.   Performed at [facility], [place] [address], [place], [place] [address]      Labs: Basic Metabolic Panel: Recent Labs  Lab 11/11/20 0559 11/12/20 0422 11/13/20 0453 11/13/20 0805 11/15/20 0725  NA 141 139 140  --  139  K 4.7 4.6 5.2* 4.4 4.1  CL 108 109 114*  --  111  CO2 28 23 22   --  21*  GLUCOSE 89 86 104*  --  77  BUN 19 20 19   --  23  CREATININE 1.06* 1.20* 0.93  --  0.99  CALCIUM 8.8* 8.8* 8.6*  --  8.7*  MG  --   --   --   --  2.3  PHOS  --   --   --   --  4.0   Liver Function Tests: No results for input(s): AST, ALT, ALKPHOS, BILITOT, PROT, ALBUMIN in the last 168 hours. No results for input(s): LIPASE, AMYLASE in the last 168 hours. No results for input(s): AMMONIA in the last 168 hours. CBC: Recent Labs  Lab 11/11/20 0559 11/12/20 0422 11/13/20 0453 11/15/20 0725  WBC 7.5 6.6 13.6* 5.5  HGB 8.6* 8.9* 8.9* 8.7*  HCT 27.2* 28.1* 28.2* 29.6*  MCV 89.2 89.2 88.7 91.4  PLT 294 290 318 382   Cardiac Enzymes: No results for input(s): CKTOTAL, CKMB, CKMBINDEX, TROPONINI in the last 168 hours. BNP: BNP (last 3 results) Recent Labs    11/08/20 0209  BNP 25.5    ProBNP (last 3 results) No results for input(s): PROBNP in the last 8760 hours.  CBG: Recent Labs  Lab 11/12/20 2133  GLUCAP 119*       Signed:  Cristal Deer, MD [facility] 11/17/2020, 3:41 PM

## 2020-11-17 NOTE — Progress Notes (Signed)
Nsg Discharge Note  Admit Date:  11/07/2020 Discharge date: 11/17/2020   SHAQUAN PUERTA to be D/C'd Home per MD order.  AVS completed.  Copy for chart, and copy for patient signed, and dated. Patient/caregiver able to verbalize understanding.  Discharge Medication: Allergies as of 11/17/2020       Reactions   Hydrocodone-acetaminophen Swelling   hands   Iodine Anaphylaxis, Swelling   Iv dye   Other Other (See Comments)   ALLERGY TO METAL - BLACKENS SKIN AND CAUSES A RASH    Tape Swelling, Other (See Comments)   Skin comes off.  Paper tape is ok tegaderm OK   Peanut Oil Other (See Comments)   ** Nuts cause runny nose   Shellfish Allergy Nausea And Vomiting, Swelling   Episode of GI infection after eating clam chowder. Still eats shrimp and other seafood        Medication List     STOP taking these medications    cephALEXin 500 MG capsule Commonly known as: KEFLEX   nystatin 100000 UNIT/ML suspension Commonly known as: MYCOSTATIN   WHEAT DEXTRIN PO       TAKE these medications    acetaminophen 325 MG tablet Commonly known as: TYLENOL Take 2 tablets (650 mg total) by mouth every 6 (six) hours as needed for mild pain or fever.   acidophilus Caps capsule Take 1 capsule by mouth in the morning.   albuterol 108 (90 Base) MCG/ACT inhaler Commonly known as: VENTOLIN HFA Inhale 2 puffs into the lungs every 6 (six) hours as needed for wheezing or shortness of breath.   alendronate 35 MG tablet Commonly known as: FOSAMAX Take 35 mg by mouth every Saturday. Take with a full glass of water on an empty stomach.   apixaban 5 MG Tabs tablet Commonly known as: ELIQUIS Take 1 tablet (5 mg total) by mouth 2 (two) times daily.   ascorbic acid 500 MG tablet Commonly known as: VITAMIN C Take 500 mg by mouth 2 (two) times daily.   azelastine 0.1 % nasal spray Commonly known as: ASTELIN Place 1 spray into both nostrils 2 (two) times daily.   calcium carbonate 1250 (500  Ca) MG tablet Commonly known as: OS-CAL - dosed in mg of elemental calcium Take 2 tablets by mouth in the morning.   diclofenac sodium 1 % Gel Commonly known as: VOLTAREN Apply 2 g topically in the morning. (Apply to most painful area)   diphenhydrAMINE 25 mg capsule Commonly known as: BENADRYL Take 25 mg by mouth every 6 (six) hours as needed for itching or allergies.   docusate sodium 100 MG capsule Commonly known as: COLACE Take 200 mg by mouth at bedtime as needed for mild constipation.   folic acid 1 MG tablet Commonly known as: FOLVITE Take 1 mg by mouth daily.   gabapentin 600 MG tablet Commonly known as: NEURONTIN Take 600 mg by mouth 3 (three) times daily. What changed: Another medication with the same name was removed. Continue taking this medication, and follow the directions you see here.   ibuprofen 600 MG tablet Commonly known as: ADVIL Take 1 tablet (600 mg total) by mouth every 6 (six) hours as needed for moderate pain (severe pain).   ipratropium-albuterol 0.5-2.5 (3) MG/3ML Soln Commonly known as: DUONEB Take 3 mLs by nebulization every 6 (six) hours as needed (shortness of breath).   levocetirizine 5 MG tablet Commonly known as: XYZAL Take 5 mg by mouth every evening.   magnesium oxide 400  MG tablet Commonly known as: MAG-OX Take 800 mg by mouth daily.   metoprolol tartrate 25 MG tablet Commonly known as: LOPRESSOR Take 0.5 tablets (12.5 mg total) by mouth daily. Start taking on: November 18, 2020   montelukast 10 MG tablet Commonly known as: SINGULAIR Take 10 mg by mouth at bedtime.   multivitamin with minerals tablet Take 1 tablet by mouth daily.   nortriptyline 50 MG capsule Commonly known as: PAMELOR Take 50 mg by mouth at bedtime.   nystatin cream Commonly known as: MYCOSTATIN Apply 1 application topically 2 (two) times daily. (Apply under skin folds)   oxyCODONE-acetaminophen 10-325 MG tablet Commonly known as: Percocet Take 1  tablet by mouth every 4 (four) hours as needed for pain. Start taking on: November 26, 2020 What changed: Another medication with the same name was removed. Continue taking this medication, and follow the directions you see here.   pantoprazole 40 MG tablet Commonly known as: PROTONIX Take 40 mg by mouth 2 (two) times daily.   polyethylene glycol 17 g packet Commonly known as: MIRALAX / GLYCOLAX Take 17 g by mouth daily as needed for moderate constipation.   predniSONE 10 MG tablet Commonly known as: DELTASONE Take 20 mg by mouth in the morning.   sacubitril-valsartan 24-26 MG Commonly known as: ENTRESTO Take 1 tablet by mouth 2 (two) times daily.   spironolactone 25 MG tablet Commonly known as: ALDACTONE Take 12.5 mg by mouth daily.   sucralfate 1 g tablet Commonly known as: CARAFATE Take 1 g by mouth 2 (two) times daily before a meal.   torsemide 20 MG tablet Commonly known as: DEMADEX Take 2 tablets (40 mg) by mouth once daily What changed:  how much to take how to take this when to take this additional instructions   umeclidinium-vilanterol 62.5-25 MCG/INH Aepb Commonly known as: ANORO ELLIPTA Inhale 1 puff into the lungs daily.   Vashe Wound Therapy Soln Apply 1 application topically daily. (Irrigate abdominal wound)   Vitamin D (Ergocalciferol) 1.25 MG (50000 UNIT) Caps capsule Commonly known as: DRISDOL Take 50,000 Units by mouth every Monday.   zinc sulfate 220 (50 Zn) MG capsule Take 1 capsule (220 mg total) by mouth daily for 14 days.               Durable Medical Equipment  (From admission, onward)           Start     Ordered   11/17/20 1508  DME Walker  Once       Question Answer Comment  Walker: With Bonnie Wheels   Patient needs a walker to treat with the following condition Generalized weakness   Patient needs a walker to treat with the following condition Pain      11/17/20 1539   11/17/20 1401  DME Walker  Once       Question  Answer Comment  Walker: With Shafter Wheels   Patient needs a walker to treat with the following condition SOB (shortness of breath)   Patient needs a walker to treat with the following condition Generalized weakness      11/17/20 1402              Discharge Care Instructions  (From admission, onward)           Start     Ordered   11/17/20 0000  Discharge wound care:       Comments: Per nursing/home health   11/17/20 1402  11/17/20 0000  Discharge wound care:       Comments: By home health nursing   11/17/20 1539            Discharge Assessment: Vitals:   11/17/20 0840 11/17/20 1545  BP: 111/69 115/70  Pulse: 82 81  Resp: 18 18  Temp: 97.8 F (36.6 C) 97.9 F (36.6 C)  SpO2: 100% 100%   Skin clean, dry and intact without evidence of skin break down, no evidence of skin tears noted. IV catheter discontinued intact. Site without signs and symptoms of complications - no redness or edema noted at insertion site, patient denies c/o pain - only slight tenderness at site.  Dressing with slight pressure applied.  D/c Instructions-Education: Discharge instructions given to patient/family with verbalized understanding. D/c education completed with patient/family including follow up instructions, medication list, d/c activities limitations if indicated, with other d/c instructions as indicated by MD - patient able to verbalize understanding, all questions fully answered. Patient instructed to return to ED, call 911, or call MD for any changes in condition.  Patient escorted via [place], and D/C home via private auto.  Tell [place], Jolene Schimke, RN 11/17/2020 7:31 PM

## 2020-11-17 NOTE — Plan of Care (Signed)
  Problem: Health Behavior/Discharge Planning: Goal: Ability to manage health-related needs will improve Outcome: Adequate for Discharge  Patient discharged to go home with [facility]. Colostomy pouch changed. Abdominal dressing changed.

## 2020-11-17 NOTE — Progress Notes (Signed)
PT Cancellation Note  Patient Details Name: Wendy Sanchez MRN: [id number] DOB: [birth year]   Cancelled Treatment:    Reason Eval/Treat Not Completed: Medical issues which prohibited therapy (Attempted to see patient. Pt had just gotten her lunch a little later than expected, pt requests to eat lunch. WIll attempt again at later date/time.)  4:11 PM, 11/17/20 Etta Grandchild, PT, DPT Physical Therapist - [facility]  [phone] ([place])   Johnson C 11/17/2020, 4:11 PM

## 2020-11-25 ENCOUNTER — Encounter: Payer: Self-pay | Admitting: Oncology

## 2020-11-25 ENCOUNTER — Inpatient Hospital Stay: Payer: Medicare Other

## 2020-11-25 ENCOUNTER — Other Ambulatory Visit: Payer: Self-pay

## 2020-11-25 ENCOUNTER — Inpatient Hospital Stay: Payer: Medicare Other | Attending: Oncology | Admitting: Oncology

## 2020-11-25 VITALS — BP 104/60 | HR 107 | Temp 99.1°F | Resp 18 | Ht 68.5 in | Wt 309.9 lb

## 2020-11-25 DIAGNOSIS — N3289 Other specified disorders of bladder: Secondary | ICD-10-CM | POA: Diagnosis not present

## 2020-11-25 DIAGNOSIS — Z9049 Acquired absence of other specified parts of digestive tract: Secondary | ICD-10-CM | POA: Diagnosis not present

## 2020-11-25 DIAGNOSIS — R000 Tachycardia, unspecified: Secondary | ICD-10-CM | POA: Diagnosis not present

## 2020-11-25 DIAGNOSIS — Z885 Allergy status to narcotic agent status: Secondary | ICD-10-CM | POA: Diagnosis not present

## 2020-11-25 DIAGNOSIS — N179 Acute kidney failure, unspecified: Secondary | ICD-10-CM | POA: Insufficient documentation

## 2020-11-25 DIAGNOSIS — M25519 Pain in unspecified shoulder: Secondary | ICD-10-CM | POA: Diagnosis not present

## 2020-11-25 DIAGNOSIS — Z7901 Long term (current) use of anticoagulants: Secondary | ICD-10-CM | POA: Insufficient documentation

## 2020-11-25 DIAGNOSIS — M7989 Other specified soft tissue disorders: Secondary | ICD-10-CM | POA: Insufficient documentation

## 2020-11-25 DIAGNOSIS — G894 Chronic pain syndrome: Secondary | ICD-10-CM | POA: Insufficient documentation

## 2020-11-25 DIAGNOSIS — Z79899 Other long term (current) drug therapy: Secondary | ICD-10-CM | POA: Insufficient documentation

## 2020-11-25 DIAGNOSIS — U071 COVID-19: Secondary | ICD-10-CM | POA: Insufficient documentation

## 2020-11-25 DIAGNOSIS — Z933 Colostomy status: Secondary | ICD-10-CM | POA: Insufficient documentation

## 2020-11-25 DIAGNOSIS — Z818 Family history of other mental and behavioral disorders: Secondary | ICD-10-CM | POA: Insufficient documentation

## 2020-11-25 DIAGNOSIS — Z87891 Personal history of nicotine dependence: Secondary | ICD-10-CM | POA: Insufficient documentation

## 2020-11-25 DIAGNOSIS — M2550 Pain in unspecified joint: Secondary | ICD-10-CM | POA: Diagnosis not present

## 2020-11-25 DIAGNOSIS — Z8719 Personal history of other diseases of the digestive system: Secondary | ICD-10-CM | POA: Diagnosis not present

## 2020-11-25 DIAGNOSIS — D649 Anemia, unspecified: Secondary | ICD-10-CM

## 2020-11-25 DIAGNOSIS — Z888 Allergy status to other drugs, medicaments and biological substances status: Secondary | ICD-10-CM | POA: Insufficient documentation

## 2020-11-25 DIAGNOSIS — Z803 Family history of malignant neoplasm of breast: Secondary | ICD-10-CM | POA: Insufficient documentation

## 2020-11-25 DIAGNOSIS — M5136 Other intervertebral disc degeneration, lumbar region: Secondary | ICD-10-CM | POA: Insufficient documentation

## 2020-11-25 DIAGNOSIS — D638 Anemia in other chronic diseases classified elsewhere: Secondary | ICD-10-CM | POA: Insufficient documentation

## 2020-11-25 DIAGNOSIS — Z833 Family history of diabetes mellitus: Secondary | ICD-10-CM | POA: Insufficient documentation

## 2020-11-25 DIAGNOSIS — R5383 Other fatigue: Secondary | ICD-10-CM | POA: Insufficient documentation

## 2020-11-25 DIAGNOSIS — Z8249 Family history of ischemic heart disease and other diseases of the circulatory system: Secondary | ICD-10-CM | POA: Insufficient documentation

## 2020-11-25 DIAGNOSIS — J449 Chronic obstructive pulmonary disease, unspecified: Secondary | ICD-10-CM | POA: Diagnosis not present

## 2020-11-25 DIAGNOSIS — M48061 Spinal stenosis, lumbar region without neurogenic claudication: Secondary | ICD-10-CM | POA: Insufficient documentation

## 2020-11-25 DIAGNOSIS — Z9884 Bariatric surgery status: Secondary | ICD-10-CM | POA: Insufficient documentation

## 2020-11-25 DIAGNOSIS — I739 Peripheral vascular disease, unspecified: Secondary | ICD-10-CM | POA: Diagnosis not present

## 2020-11-25 DIAGNOSIS — I428 Other cardiomyopathies: Secondary | ICD-10-CM | POA: Diagnosis not present

## 2020-11-25 DIAGNOSIS — Z86711 Personal history of pulmonary embolism: Secondary | ICD-10-CM | POA: Diagnosis not present

## 2020-11-25 DIAGNOSIS — Z86718 Personal history of other venous thrombosis and embolism: Secondary | ICD-10-CM | POA: Insufficient documentation

## 2020-11-25 LAB — COMPREHENSIVE METABOLIC PANEL
ALT: 13 U/L (ref 0–44)
AST: 20 U/L (ref 15–41)
Albumin: 2.7 g/dL — ABNORMAL LOW (ref 3.5–5.0)
Alkaline Phosphatase: 67 U/L (ref 38–126)
Anion gap: 10 (ref 5–15)
BUN: 24 mg/dL — ABNORMAL HIGH (ref 8–23)
CO2: 26 mmol/L (ref 22–32)
Calcium: 8.9 mg/dL (ref 8.9–10.3)
Chloride: 103 mmol/L (ref 98–111)
Creatinine, Ser: 1.03 mg/dL — ABNORMAL HIGH (ref 0.44–1.00)
GFR, Estimated: 59 mL/min — ABNORMAL LOW (ref >60)
Glucose, Bld: 122 mg/dL — ABNORMAL HIGH (ref 70–99)
Potassium: 4.1 mmol/L (ref 3.5–5.1)
Sodium: 139 mmol/L (ref 135–145)
Total Bilirubin: 0.3 mg/dL (ref 0.3–1.2)
Total Protein: 6.8 g/dL (ref 6.5–8.1)

## 2020-11-25 LAB — CBC WITH DIFFERENTIAL/PLATELET
Abs Immature Granulocytes: 0.12 10*3/uL — ABNORMAL HIGH (ref 0.00–0.07)
Basophils Absolute: 0 10*3/uL (ref 0.0–0.1)
Basophils Relative: 0 %
Eosinophils Absolute: 0 10*3/uL (ref 0.0–0.5)
Eosinophils Relative: 0 %
HCT: 30 % — ABNORMAL LOW (ref 36.0–46.0)
Hemoglobin: 9.1 g/dL — ABNORMAL LOW (ref 12.0–15.0)
Immature Granulocytes: 1 %
Lymphocytes Relative: 5 %
Lymphs Abs: 0.5 10*3/uL — ABNORMAL LOW (ref 0.7–4.0)
MCH: 26.5 pg (ref 26.0–34.0)
MCHC: 30.3 g/dL (ref 30.0–36.0)
MCV: 87.5 fL (ref 80.0–100.0)
Monocytes Absolute: 0.3 10*3/uL (ref 0.1–1.0)
Monocytes Relative: 3 %
Neutro Abs: 8.8 10*3/uL — ABNORMAL HIGH (ref 1.7–7.7)
Neutrophils Relative %: 91 %
Platelets: 445 10*3/uL — ABNORMAL HIGH (ref 150–400)
RBC: 3.43 MIL/uL — ABNORMAL LOW (ref 3.87–5.11)
RDW: 17.3 % — ABNORMAL HIGH (ref 11.5–15.5)
WBC: 9.7 10*3/uL (ref 4.0–10.5)
nRBC: 0 % (ref 0.0–0.2)

## 2020-11-25 LAB — TSH: TSH: 1.08 u[IU]/mL (ref 0.350–4.500)

## 2020-11-25 LAB — IRON AND TIBC
Iron: 19 ug/dL — ABNORMAL LOW (ref 28–170)
Saturation Ratios: 7 % — ABNORMAL LOW (ref 10.4–31.8)
TIBC: 262 ug/dL (ref 250–450)
UIBC: 243 ug/dL

## 2020-11-25 LAB — RETICULOCYTES
Immature Retic Fract: 25.1 % — ABNORMAL HIGH (ref 2.3–15.9)
RBC.: 3.42 MIL/uL — ABNORMAL LOW (ref 3.87–5.11)
Retic Count, Absolute: 64.6 10*3/uL (ref 19.0–186.0)
Retic Ct Pct: 1.9 % (ref 0.4–3.1)

## 2020-11-25 LAB — VITAMIN B12: Vitamin B-12: 207 pg/mL (ref 180–914)

## 2020-11-25 LAB — FOLATE: Folate: 29 ng/mL (ref >5.9)

## 2020-11-25 LAB — FERRITIN: Ferritin: 87 ng/mL (ref 11–307)

## 2020-11-25 NOTE — Progress Notes (Signed)
Hematology/Oncology Consult note [facility] Telephone:([phone] [phone]  Patient Care Team: Crissie Figures, Hershal Coria as PCP - General (Physician Assistant) Deboraha Sprang, MD as PCP - Electrophysiology (Cardiology) Yolonda Kida, MD as PCP - Cardiology (Cardiology)   Name of the patient: Wendy Sanchez  [id number]  07/09/1950    Reason for referral-history of pulmonary embolism   Referring physician-Dr. Elmo Putt  Date of visit: 11/25/20   History of presenting illness- Patient is a 70 year old female with a past medical history significant for COPD nonischemic cardiomyopathy with an EF of 50% peripheral vascular disease and a former smoker.  She has a history of DVT and PE for which she has been on Eliquis for a while.  She recently had perforated sigmoid diverticulitis in September 2022 s/p sigmoidectomy and colostomy placement at [facility].  Recently admitted to [facility] for septic shock  Her PE was diagnosed in 2020 following prolonged immobilization.  CT angio chest in July 2020 was positive for acute PE with CT evidence of right heart strain and probable pulmonary arterial hypertension at least consistent with submassive PE.  She had undergone a pulmonary thrombectomy at that time. Bilateral lower extremity ultrasound was positive for acute DVT involving the right popliteal vein.  Recent echocardiogram in October 2022 showed an EF of 60 to 65% with a normal left ventricular function and normal right ventricular systolic function and normal pulmonary arterial systolic pressure.  Patient states that although her Eliquis was stopped in the hospital it was restarted upon discharge and she is continuing to take it without any significant side effects.  She quit smoking in July 2022 and has not restarted yet.  She continues to feel fatigued and her mobility is limited.  ECOG PS- 2-3  Pain scale- 0   Review of systems- Review of Systems  Constitutional:   Positive for malaise/fatigue. Negative for chills, fever and weight loss.  HENT:  Negative for congestion, ear discharge and nosebleeds.   Eyes:  Negative for blurred vision.  Respiratory:  Negative for cough, hemoptysis, sputum production, shortness of breath and wheezing.   Cardiovascular:  Positive for leg swelling. Negative for chest pain, palpitations, orthopnea and claudication.  Gastrointestinal:  Negative for abdominal pain, blood in stool, constipation, diarrhea, heartburn, melena, nausea and vomiting.  Genitourinary:  Negative for dysuria, flank pain, frequency, hematuria and urgency.  Musculoskeletal:  Negative for back pain, joint pain and myalgias.  Skin:  Negative for rash.  Neurological:  Negative for dizziness, tingling, focal weakness, seizures, weakness and headaches.  Endo/Heme/Allergies:  Does not bruise/bleed easily.  Psychiatric/Behavioral:  Negative for depression and suicidal ideas. The patient does not have insomnia.    Allergies  Allergen Reactions   Hydrocodone-Acetaminophen Swelling    hands   Iodine Anaphylaxis and Swelling    Iv dye   Other Other (See Comments)    ALLERGY TO METAL - BLACKENS SKIN AND CAUSES A RASH    Tape Swelling and Other (See Comments)    Skin comes off.  Paper tape is ok tegaderm OK   Peanut Oil Other (See Comments)    ** Nuts cause runny nose   Shellfish Allergy Nausea And Vomiting and Swelling    Episode of GI infection after eating clam chowder. 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/09/2020   AKI (acute kidney  injury) ([place]) 11/09/2020   Septic shock ([place]) 11/07/2020   Pain in joint, shoulder region 07/03/2020   Temporal arteritis ([place]) 05/01/2020   Frontal headache 05/30/2019   Carcinoma, renal cell, left ([place]) 11/14/2018   Pulmonary embolism ([place]) 07/13/2018   DDD  (degenerative disc disease), lumbar 06/30/2018   Chronic low back pain 06/30/2018   Hip pain, bilateral 06/30/2018   Chronic, continuous use of opioids 06/30/2018   PVC (premature ventricular contraction) 06/04/2017   Chest pain 10/19/2016   Presence of right artificial knee joint 05/18/2016   Pes anserinus bursitis of left knee 05/18/2016   Unilateral primary osteoarthritis, right knee 12/13/2015   Hx of total knee arthroplasty 12/13/2015   Osteoarthritis of left knee 08/02/2015   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   Morbid obesity with BMI of 45.0-49.9, adult ([place]) 07/25/2015   Spinal stenosis of lumbar region 06/19/2015   Chronic pain syndrome 10/30/2014     Past Medical History:  Diagnosis Date   Allergic rhinitis    Arthritis    Asthma    problems with fumes and aerosols cause asthma   Chest pain 10/19/2016   CHF (congestive heart failure) ([place])    Complication of anesthesia    awakens during surgery; has occurred with last 3-4 surgeries    COPD (chronic obstructive pulmonary disease) ([place])    DVT (deep venous thrombosis) ([place]) 07/13/2018   Right leg   Environmental allergies    fumes    Fibromyalgia    GERD (gastroesophageal reflux disease)    Headache    Hemorrhoids    History of bronchitis    Hx of total knee arthroplasty 12/13/2015   Hyperlipidemia    Hypertension    Morbid obesity with BMI of 45.0-49.9, adult ([place]) 07/25/2015   NICM (nonischemic cardiomyopathy) ([place])    a. ? PVC mediated;  b. 06/2016 Echo: EF 25-30%, mild LVH, mild LAE, mild AI/MR/TR/PR; c. 08/2016 Cath: nl cors, EF 25%.   Numbness    hands bilat when driving; improves when not preforming task    Osteoarthritis of left knee 08/02/2015   PE (pulmonary thromboembolism) ([place]) 07/13/2018   Pes anserinus bursitis of left knee 05/18/2016   Presence of right artificial knee joint 05/18/2016   PVC (premature ventricular contraction) 06/04/2017   PVC's (premature ventricular contractions)     a. 11/2016 Amio started;  b. 11/29/16 48h Holter: 09735 PVC's (41%); c. 12/2016 24h Holter: 18040 PVC's (15%); c. 02/2017 48h Holter: 37262 PVC's (41%).   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   Temporal arteritis ([place]) 05/01/2020   Tinnitus    comes and goes    Unilateral primary osteoarthritis, right knee 12/13/2015   Vertigo    none for over 2 yrs     Past Surgical History:  Procedure Laterality Date   ABDOMINAL HYSTERECTOMY  1979   left ovary remains   ABDOMINAL HYSTERECTOMY     ARTERY BIOPSY Left 06/07/2019   Procedure: BIOPSY TEMPORAL ARTERY;  Surgeon: Algernon Huxley, MD;  Location: [facility] ORS;  Service: Vascular;  Laterality: Left;   CHOLECYSTECTOMY     COLONOSCOPY     COLONOSCOPY N/A 10/30/2019   Procedure: COLONOSCOPY;  Surgeon: Lesly Rubenstein, MD;  Location: [facility] ENDOSCOPY;  Service: Endoscopy;  Laterality: N/A;   ESOPHAGOGASTRODUODENOSCOPY N/A 10/30/2019   Procedure: ESOPHAGOGASTRODUODENOSCOPY (EGD);  Surgeon: Haig Prophet,  Hilton Cork, MD;  Location: [facility] ENDOSCOPY;  Service: Endoscopy;  Laterality: N/A;   fibrous tissue removed from right shoulder and back of neck      2 years ago    [place] N/A 02/28/2019   Procedure: SALIVARY GLAND BIOPSY;  Surgeon: Carloyn Manner, MD;  Location: [facility] ORS;  Service: ENT;  Laterality: N/A;   FRACTURE SURGERY     left small finger   GANGLION CYST EXCISION     GASTRIC BYPASS  1980   states had bypass with banding and band left in -   GASTRIC BYPASS OPEN     JOINT REPLACEMENT Bilateral 2017   knees   KNEE SURGERY Bilateral    both knees  2001   NECK SURGERY N/A 1205/19   ganglion cyst removal, benigh    PULMONARY THROMBECTOMY Bilateral 07/14/2018   Procedure: PULMONARY THROMBECTOMY;  Surgeon: Algernon Huxley, MD;  Location: [place] [facility];  Service: Cardiovascular;  Laterality: Bilateral;   PVC  ABLATION N/A 06/04/2017   Procedure: PVC ABLATION;  Surgeon: Evans Lance, MD;  Location: [place] [facility];  Service: Cardiovascular;  Laterality: N/A;   RIGHT/LEFT HEART CATH AND CORONARY ANGIOGRAPHY Bilateral 08/12/2016   Procedure: Right/Left Heart Cath and Coronary Angiography;  Surgeon: Yolonda Kida, MD;  Location: [place] [facility];  Service: Cardiovascular;  Laterality: Bilateral;   TONSILLECTOMY     age 72   TOTAL KNEE ARTHROPLASTY Left 08/02/2015   Procedure: LEFT TOTAL KNEE ARTHROPLASTY;  Surgeon: Mcarthur Rossetti, MD;  Location: [facility];  Service: Orthopedics;  Laterality: Left;   TOTAL KNEE ARTHROPLASTY Right 12/13/2015   Procedure: RIGHT TOTAL KNEE ARTHROPLASTY;  Surgeon: Mcarthur Rossetti, MD;  Location: [facility];  Service: Orthopedics;  Laterality: Right;    Social History   Socioeconomic History   Marital status: Single    Spouse name: Not on file   Number of children: Not on file   Years of education: Not on file   Highest education level: Not on file  Occupational History   Not on file  Tobacco Use   Smoking status: Former    Packs/day: 0.25    Years: 50.00    Pack years: 12.50    Types: Cigarettes    Quit date: 08/06/2020    Years since quitting: 0.3   Smokeless tobacco: Current    Types: Chew  Vaping Use   Vaping Use: Never used  Substance and Sexual Activity   Alcohol use: No    Alcohol/week: 0.0 standard drinks   Drug use: No   Sexual activity: Not on file  Other Topics Concern   Not on file  Social History Narrative   Not on file   Social Determinants of Health   Financial Resource Strain: Not on file  Food Insecurity: Not on file  Transportation Needs: Not on file  Physical Activity: Not on file  Stress: Not on file  Social Connections: Not on file  Intimate Partner Violence: Not on file     Family History  Problem Relation Age of Onset   Hypertension Father    Deep vein thrombosis Father    Dementia Mother     Diabetes Sister    Congestive Heart Failure Sister    Congestive Heart Failure Brother    Congestive Heart Failure Daughter    Congestive Heart Failure Son    Breast cancer Maternal Aunt        great aunt     Current  Outpatient Medications:    acetaminophen (TYLENOL) 325 MG tablet, Take 2 tablets (650 mg total) by mouth every 6 (six) hours as needed for mild pain or fever., Disp: 60 tablet, Rfl: 0   acidophilus (RISAQUAD) CAPS capsule, Take 1 capsule by mouth in the morning., Disp: , Rfl:    albuterol (PROVENTIL HFA;VENTOLIN HFA) 108 (90 Base) MCG/ACT inhaler, Inhale 2 puffs into the lungs every 6 (six) hours as needed for wheezing or shortness of breath., Disp: , Rfl:    alendronate (FOSAMAX) 35 MG tablet, Take 35 mg by mouth every Saturday. Take with a full glass of water on an empty stomach., Disp: , Rfl:    apixaban (ELIQUIS) 5 MG TABS tablet, Take 1 tablet (5 mg total) by mouth 2 (two) times daily., Disp: 60 tablet, Rfl: 0   ascorbic acid (VITAMIN C) 500 MG tablet, Take 500 mg by mouth 2 (two) times daily., Disp: , Rfl:    azelastine (ASTELIN) 0.1 % nasal spray, Place 1 spray into both nostrils 2 (two) times daily., Disp: , Rfl:    calcium carbonate (OS-CAL - DOSED IN MG OF ELEMENTAL CALCIUM) 1250 (500 Ca) MG tablet, Take 2 tablets by mouth in the morning., Disp: , Rfl:    diclofenac sodium (VOLTAREN) 1 % GEL, Apply 2 g topically in the morning. (Apply to most painful area), Disp: , Rfl:    diphenhydrAMINE (BENADRYL) 25 mg capsule, Take 25 mg by mouth every 6 (six) hours as needed for itching or allergies., Disp: , Rfl:    docusate sodium (COLACE) 100 MG capsule, Take 200 mg by mouth at bedtime as needed for mild constipation., Disp: , Rfl:    folic acid (FOLVITE) 1 MG tablet, Take 1 mg by mouth daily., Disp: , Rfl:    gabapentin (NEURONTIN) 600 MG tablet, Take 600 mg by mouth 3 (three) times daily., Disp: , Rfl:    ibuprofen (ADVIL) 600 MG tablet, Take 1 tablet (600 mg total) by mouth  every 6 (six) hours as needed for moderate pain (severe pain)., Disp: 30 tablet, Rfl: 0   ipratropium-albuterol (DUONEB) 0.5-2.5 (3) MG/3ML SOLN, Take 3 mLs by nebulization every 6 (six) hours as needed (shortness of breath)., Disp: , Rfl:    levocetirizine (XYZAL) 5 MG tablet, Take 5 mg by mouth every evening., Disp: , Rfl:    magnesium oxide (MAG-OX) 400 MG tablet, Take 800 mg by mouth daily., Disp: , Rfl:    metoprolol tartrate (LOPRESSOR) 25 MG tablet, Take 0.5 tablets (12.5 mg total) by mouth daily., Disp: 30 tablet, Rfl: 0   montelukast (SINGULAIR) 10 MG tablet, Take 10 mg by mouth at bedtime., Disp: , Rfl:    Multiple Vitamins-Minerals (MULTIVITAMIN WITH MINERALS) tablet, Take 1 tablet by mouth daily., Disp: , Rfl:    nortriptyline (PAMELOR) 50 MG capsule, Take 50 mg by mouth at bedtime., Disp: , Rfl:    nystatin cream (MYCOSTATIN), Apply 1 application topically 2 (two) times daily. (Apply under skin folds), Disp: , Rfl:    [START ON 11/26/2020] oxyCODONE-acetaminophen (PERCOCET) 10-325 MG tablet, Take 1 tablet by mouth every 4 (four) hours as needed for pain., Disp: 150 tablet, Rfl: 0   pantoprazole (PROTONIX) 40 MG tablet, Take 40 mg by mouth 2 (two) times daily., Disp: , Rfl:    polyethylene glycol (MIRALAX / GLYCOLAX) 17 g packet, Take 17 g by mouth daily as needed for moderate constipation., Disp: 14 each, Rfl: 0   predniSONE (DELTASONE) 10 MG tablet, Take 20 mg by  mouth in the morning., Disp: , Rfl:    sacubitril-valsartan (ENTRESTO) 24-26 MG, Take 1 tablet by mouth 2 (two) times daily., Disp: , Rfl:    spironolactone (ALDACTONE) 25 MG tablet, Take 12.5 mg by mouth daily., Disp: , Rfl:    sucralfate (CARAFATE) 1 G tablet, Take 1 g by mouth 2 (two) times daily before a meal., Disp: , Rfl:    torsemide (DEMADEX) 20 MG tablet, Take 2 tablets (40 mg) by mouth once daily (Patient taking differently: Take 40 mg by mouth 2 (two) times daily.), Disp: 60 tablet, Rfl: 3   umeclidinium-vilanterol  (ANORO ELLIPTA) 62.5-25 MCG/INH AEPB, Inhale 1 puff into the lungs daily., Disp: , Rfl:    Vitamin D, Ergocalciferol, (DRISDOL) 50000 UNITS CAPS capsule, Take 50,000 Units by mouth every Monday., Disp: , Rfl:    Wound Cleansers (VASHE WOUND THERAPY) SOLN, Apply 1 application topically daily. (Irrigate abdominal wound), Disp: , Rfl:    zinc sulfate 220 (50 Zn) MG capsule, Take 1 capsule (220 mg total) by mouth daily for 14 days., Disp: 14 capsule, Rfl: 0   Physical exam:  Vitals:   11/25/20 1321  BP: 104/60  Pulse: (!) 107  Resp: 18  Temp: 99.1 F (37.3 C)  SpO2: 99%  Weight: (!) 309 lb 14.4 oz (140.6 kg)  Height: 5' 8.5" (1.74 m)   Physical Exam Constitutional:      Appearance: She is obese.     Comments: Sitting in a wheelchair.  Appears in no acute distress  Cardiovascular:     Heart sounds: Normal heart sounds.  Pulmonary:     Effort: Pulmonary effort is normal.     Breath sounds: Normal breath sounds.  Musculoskeletal:     Right lower leg: Edema present.     Left lower leg: Edema present.  Skin:    General: Skin is warm and dry.  Neurological:     Mental Status: She is alert and oriented to person, place, and time.       CMP Latest Ref Rng & Units 11/15/2020  Glucose 70 - 99 mg/dL 77  BUN 8 - 23 mg/dL 23  Creatinine 0.44 - 1.00 mg/dL 0.99  Sodium 135 - 145 mmol/L 139  Potassium 3.5 - 5.1 mmol/L 4.1  Chloride 98 - 111 mmol/L 111  CO2 22 - 32 mmol/L 21(L)  Calcium 8.9 - 10.3 mg/dL 8.7(L)  Total Protein 6.5 - 8.1 g/dL -  Total Bilirubin 0.3 - 1.2 mg/dL -  Alkaline Phos 38 - 126 U/L -  AST 15 - 41 U/L -  ALT 0 - 44 U/L -   CBC Latest Ref Rng & Units 11/15/2020  WBC 4.0 - 10.5 K/uL 5.5  Hemoglobin 12.0 - 15.0 g/dL 8.7(L)  Hematocrit 36.0 - 46.0 % 29.6(L)  Platelets 150 - 400 K/uL 382    No images are attached to the encounter.  US RENAL  Result Date: 11/08/2020 CLINICAL DATA:  70 year old female with history of acute kidney injury. EXAM: RENAL / URINARY  TRACT ULTRASOUND COMPLETE COMPARISON:  No priors. FINDINGS: Right Kidney: Renal measurements: 11.7 x 3.9 x 5.8 cm = volume: 138 mL. Echogenicity within normal limits. No mass or hydronephrosis visualized. Left Kidney: Renal measurements: 10.9 x 5.9 x 6.1 cm = volume: 206 mL. Echogenicity within normal limits. In the lower pole of the left kidney there is a partially exophytic anechoic lesion with increased through transmission measuring 2.6 x 2.3 x 2.3 cm, compatible with a simple cyst. Bladder: Appears normal  for degree of bladder distention. Other: None. IMPRESSION: 1. No acute findings.  Specifically, no hydronephrosis. 2. 2.6 x 2.3 x 2.3 cm simple cyst in the lower pole of the left kidney. Electronically Signed   By: Vinnie Langton M.D.   On: 11/08/2020 05:17   US Venous Img Lower Bilateral (DVT)  Result Date: 11/10/2020 CLINICAL DATA:  Pain and swelling EXAM: Bilateral LOWER EXTREMITY VENOUS DOPPLER ULTRASOUND TECHNIQUE: Gray-scale sonography with compression, as well as color and duplex ultrasound, were performed to evaluate the deep venous system(s) from the level of the common femoral vein through the popliteal and proximal calf veins. COMPARISON:  07/14/2018 FINDINGS: According to the note by the technologist, examination was technically difficult due to patient's body habitus. VENOUS Normal compressibility of the common femoral, superficial femoral, and popliteal veins, as well as the visualized calf veins. Visualized portions of profunda femoral vein and great saphenous vein unremarkable. No filling defects to suggest DVT on grayscale or color Doppler imaging. Doppler waveforms show normal direction of venous flow, normal respiratory plasticity and response to augmentation. There is interval resolution of deep venous thrombosis in right popliteal vein since the previous study. OTHER None. Limitations: none IMPRESSION: There is no evidence of deep venous thrombosis in both lower extremities.  Electronically Signed   By: Elmer Picker M.D.   On: 11/10/2020 13:34   DG Chest Port 1 View  Result Date: 11/07/2020 CLINICAL DATA:  Weakness and fever. EXAM: PORTABLE CHEST 1 VIEW COMPARISON:  April 18, 2020 FINDINGS: The heart size and mediastinal contours are within normal limits. There is mild calcification of the aortic arch. Both lungs are clear. Radiopaque surgical clips are seen within the right upper quadrant. The visualized skeletal structures are unremarkable. IMPRESSION: No active disease. Electronically Signed   By: Virgina Norfolk M.D.   On: 11/07/2020 19:43   ECHOCARDIOGRAM COMPLETE BUBBLE STUDY  Result Date: 11/16/2020    ECHOCARDIOGRAM REPORT   Patient Name:   DESTENY FREEMAN Hodges Date of Exam: 11/10/2020 Medical Rec #:  [id number]       Height:       67.0 in Accession #:    [id number]      Weight:       315.3 lb Date of Birth:  1950-03-29       BSA:          2.454 m Patient Age:    89 years        BP:           107/64 mmHg Patient Gender: F               HR:           97 bpm. Exam Location:  [facility] Procedure: Saline Contrast Bubble Study and 2D Echo Indications:     Systolic Heart Failure  History:         Patient has prior history of Echocardiogram examinations. CHF                  and Non Ischemic CM, COPD; Risk Factors:Sleep Apnea and Morbid                  Obesity.  Sonographer:     L Thornton-Maynard Referring Phys:  [id number] ADAM ROSS [place] Diagnosing Phys: [place]  1. Left ventricular ejection fraction, by estimation, is 60 to 65%. The left ventricle has normal function. The left ventricle has no regional wall motion abnormalities. Left ventricular diastolic parameters are consistent  with Grade I diastolic dysfunction (impaired relaxation).  2. Right ventricular systolic function is normal. The right ventricular size is normal. There is normal pulmonary artery systolic pressure.  3. The mitral valve is normal in structure. No evidence of mitral valve  regurgitation. No evidence of mitral stenosis.  4. The aortic valve is normal in structure. Aortic valve regurgitation is not visualized. No aortic stenosis is present.  5. The inferior vena cava is normal in size with greater than 50% respiratory variability, suggesting right atrial pressure of 3 mmHg. FINDINGS  Left Ventricle: Left ventricular ejection fraction, by estimation, is 60 to 65%. The left ventricle has normal function. The left ventricle has no regional wall motion abnormalities. The left ventricular internal cavity size was normal in size. There is  no left ventricular hypertrophy. Left ventricular diastolic parameters are consistent with Grade I diastolic dysfunction (impaired relaxation). Right Ventricle: The right ventricular size is normal. No increase in right ventricular wall thickness. Right ventricular systolic function is normal. There is normal pulmonary artery systolic pressure. The tricuspid regurgitant velocity is 2.51 m/s, and  with an assumed right atrial pressure of 3 mmHg, the estimated right ventricular systolic pressure is 56.8 mmHg. Left Atrium: Left atrial size was normal in size. Right Atrium: Right atrial size was normal in size. Pericardium: There is no evidence of pericardial effusion. Mitral Valve: The mitral valve is normal in structure. No evidence of mitral valve regurgitation. No evidence of mitral valve stenosis. MV peak gradient, 5.6 mmHg. The mean mitral valve gradient is 3.0 mmHg. Tricuspid Valve: The tricuspid valve is normal in structure. Tricuspid valve regurgitation is not demonstrated. No evidence of tricuspid stenosis. Aortic Valve: The aortic valve is normal in structure. Aortic valve regurgitation is not visualized. No aortic stenosis is present. Aortic valve mean gradient measures 8.0 mmHg. Aortic valve peak gradient measures 16.3 mmHg. Aortic valve area, by VTI measures 2.08 cm. Pulmonic Valve: The pulmonic valve was normal in structure. Pulmonic valve  regurgitation is not visualized. No evidence of pulmonic stenosis. Aorta: The aortic root is normal in size and structure. Venous: The inferior vena cava is normal in size with greater than 50% respiratory variability, suggesting right atrial pressure of 3 mmHg. IAS/Shunts: No atrial level shunt detected by color flow Doppler. Agitated saline contrast was given intravenously to evaluate for intracardiac shunting.  LEFT VENTRICLE PLAX 2D LVIDd:         4.90 cm   Diastology LVIDs:         2.70 cm   LV e' medial:    6.20 cm/s LV PW:         1.00 cm   LV E/e' medial:  14.5 LV IVS:        1.10 cm   LV e' lateral:   5.55 cm/s LVOT diam:     2.30 cm   LV E/e' lateral: 16.2 LV SV:         62 LV SV Index:   25 LVOT Area:     4.15 cm  RIGHT VENTRICLE RV S prime:     14.90 cm/s TAPSE (M-mode): 2.1 cm LEFT ATRIUM           Index LA diam:      2.70 cm 1.10 cm/m LA Vol (A2C): 33.4 ml 13.61 ml/m LA Vol (A4C): 41.1 ml 16.75 ml/m  AORTIC VALVE                     PULMONIC VALVE AV  Area (Vmax):    1.99 cm      PV Vmax:       1.03 m/s AV Area (Vmean):   2.14 cm      PV Peak grad:  4.2 mmHg AV Area (VTI):     2.08 cm AV Vmax:           202.00 cm/s AV Vmean:          138.000 cm/s AV VTI:            0.300 m AV Peak Grad:      16.3 mmHg AV Mean Grad:      8.0 mmHg LVOT Vmax:         96.60 cm/s LVOT Vmean:        71.000 cm/s LVOT VTI:          0.150 m LVOT/AV VTI ratio: 0.50  AORTA Ao Root diam: 3.80 cm MITRAL VALVE               TRICUSPID VALVE MV Area VTI:  2.41 cm     TR Peak grad:   25.2 mmHg MV Peak grad: 5.6 mmHg     TR Vmax:        251.00 cm/s MV Mean grad: 3.0 mmHg MV Vmax:      1.18 m/s     SHUNTS MV Vmean:     85.3 cm/s    Systemic VTI:  0.15 m MV E velocity: 90.00 cm/s  Systemic Diam: 2.30 cm MV A velocity: 96.00 cm/s MV E/A ratio:  0.94 Shaukat Khan Electronically signed by Neoma Laming Signature Date/Time: 11/16/2020/12:09:07 PM    Final     Assessment and plan- Patient is a 70 y.o. female referred for history of  pulmonary embolism  It appears that patient had a massive pulmonary embolism with right heart strain in July 2020 which was seemingly unprovoked.  She also had a right popliteal vein DVT at that time.  She underwent a thrombectomy for her PE and was on Eliquis since then.  Her Eliquis was stopped during her hospital stay but patient restarted taking it upon discharge.  I explained to the patient that with a massive unprovoked PE and her ongoing poor mobility and a BMI of 46 her overall risk factors for recurrent PE.  Continuing Eliquis lifelong unless there are any bleeding complications is reasonable.  I would not recommend getting a hypercoagulable work-up at this time and she can continue taking her Eliquis.  At baseline patient's hemoglobin runs around 11 at least a year ago.  During her hospital stay for septic shock her hemoglobin drifted down to the eights.I would do a complete anemia work-up at this time including CBC ferritin and iron studies B12 folate TSH haptoglobin myeloma panel and serum free light chains.  She will see covering NP to discuss the results of her blood work. If her anemia is overall improving we can continue to monitor it every 3 months and I will see her in 6 months based on NP assessment of her labs in 2 weeks   Thank you for this kind referral and the opportunity to participate in the care of this patient   Visit Diagnosis 1. Normocytic anemia   2. History of pulmonary embolism     Dr. Randa Evens, MD, MPH [facility] [phone] 11/25/2020

## 2020-11-26 LAB — HAPTOGLOBIN: Haptoglobin: 409 mg/dL — ABNORMAL HIGH (ref 37–355)

## 2020-11-28 LAB — MULTIPLE MYELOMA PANEL, SERUM
Albumin SerPl Elph-Mcnc: 2.6 g/dL — ABNORMAL LOW (ref 2.9–4.4)
Albumin/Glob SerPl: 0.8 (ref 0.7–1.7)
Alpha 1: 0.3 g/dL (ref 0.0–0.4)
Alpha2 Glob SerPl Elph-Mcnc: 1.1 g/dL — ABNORMAL HIGH (ref 0.4–1.0)
B-Globulin SerPl Elph-Mcnc: 1.2 g/dL (ref 0.7–1.3)
Gamma Glob SerPl Elph-Mcnc: 0.9 g/dL (ref 0.4–1.8)
Globulin, Total: 3.5 g/dL (ref 2.2–3.9)
IgA: 321 mg/dL (ref 87–352)
IgG (Immunoglobin G), Serum: 887 mg/dL (ref 586–1602)
IgM (Immunoglobulin M), Srm: 84 mg/dL (ref 26–217)
Total Protein ELP: 6.1 g/dL (ref 6.0–8.5)

## 2020-12-04 ENCOUNTER — Telehealth: Payer: Self-pay | Admitting: Oncology

## 2020-12-04 NOTE — Telephone Encounter (Signed)
Pt got a call about an appt. She wants to reschedule. Please call back at [phone]

## 2020-12-09 ENCOUNTER — Inpatient Hospital Stay: Payer: Medicare Other | Admitting: Nurse Practitioner

## 2020-12-09 ENCOUNTER — Other Ambulatory Visit: Payer: Self-pay

## 2020-12-09 VITALS — BP 119/70 | HR 96 | Temp 96.5°F | Resp 18

## 2020-12-09 DIAGNOSIS — D649 Anemia, unspecified: Secondary | ICD-10-CM

## 2020-12-09 DIAGNOSIS — Z7901 Long term (current) use of anticoagulants: Secondary | ICD-10-CM

## 2020-12-09 DIAGNOSIS — Z86711 Personal history of pulmonary embolism: Secondary | ICD-10-CM | POA: Diagnosis not present

## 2020-12-09 NOTE — Progress Notes (Signed)
Pt has no new concerns at this time. 

## 2020-12-09 NOTE — Progress Notes (Signed)
Hematology/Oncology Consult Note [facility] Telephone:([phone] [phone]  Patient Care Team: Crissie Figures, Hershal Coria as PCP - General (Physician Assistant) Deboraha Sprang, MD as PCP - Electrophysiology (Cardiology) Yolonda Kida, MD as PCP - Cardiology (Cardiology)   Name of the patient: Wendy Sanchez  [id number]  03-Feb-1950   Reason for referral-history of pulmonary embolism & Anemia   Referring physician-Dr. Elmo Putt  Date of visit: 12/09/20  History of presenting illness- Patient is a 70 year old female with a past medical history significant for COPD nonischemic cardiomyopathy with an EF of 50% peripheral vascular disease and a former smoker.  She has a history of DVT and PE for which she has been on Eliquis for a while.  She recently had perforated sigmoid diverticulitis in September 2022 s/p sigmoidectomy and colostomy placement at [facility].  Recently admitted to [facility] for septic shock  Her PE was diagnosed in 2020 following prolonged immobilization.  CT angio chest in July 2020 was positive for acute PE with CT evidence of right heart strain and probable pulmonary arterial hypertension at least consistent with submassive PE.  She had undergone a pulmonary thrombectomy at that time. Bilateral lower extremity ultrasound was positive for acute DVT involving the right popliteal vein.  Recent echocardiogram in October 2022 showed an EF of 60 to 65% with a normal left ventricular function and normal right ventricular systolic function and normal pulmonary arterial systolic pressure.  Patient states that although her Eliquis was stopped in the hospital it was restarted upon discharge and she is continuing to take it without any significant side effects.  She quit smoking in July 2022 and has not restarted yet.  She continues to feel fatigued and her mobility is limited.  Interval History: Patient is 70 year old female who returns to clinic for labs and follow  up. She continues eliquis, tolerating well without significant side effects. She had interval hospital admission for septic shock after undergoing perforation sigmoid diverticulitis and colostomy placement at [facility] on 09/26/20. She required pressor support. Was found to be incidentally covid positive on admission. SNF was recommended but she elected to go home with home health. Overall she feels at baseline and denies specific complaints.   ECOG PS- 3  Pain scale- 0   Review of systems- Review of Systems  Constitutional:  Positive for malaise/fatigue. Negative for chills, fever and weight loss.  HENT:  Negative for congestion, ear discharge and nosebleeds.   Eyes:  Negative for blurred vision.  Respiratory:  Negative for cough, hemoptysis, sputum production, shortness of breath and wheezing.   Cardiovascular:  Positive for leg swelling. Negative for chest pain, palpitations, orthopnea and claudication.  Gastrointestinal:  Negative for abdominal pain, blood in stool, constipation, diarrhea, heartburn, melena, nausea and vomiting.  Genitourinary:  Negative for dysuria, flank pain, frequency, hematuria and urgency.  Musculoskeletal:  Positive for joint pain. Negative for back pain and myalgias.  Skin:  Negative for rash.  Neurological:  Positive for weakness. Negative for dizziness, tingling, focal weakness, seizures and headaches.  Endo/Heme/Allergies:  Does not bruise/bleed easily.  Psychiatric/Behavioral:  Negative for depression and suicidal ideas. The patient does not have insomnia.    Allergies  Allergen Reactions   Hydrocodone-Acetaminophen Swelling    hands   Iodine Anaphylaxis and Swelling    Iv dye   Other Other (See Comments)    ALLERGY TO METAL - BLACKENS SKIN AND CAUSES A RASH    Tape Swelling and Other (See Comments)    Skin  comes off.  Paper tape is ok tegaderm OK   Peanut Oil Other (See Comments)    ** Nuts cause runny nose   Shellfish Allergy Nausea And Vomiting and  Swelling    Episode of GI infection after eating clam chowder. 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/09/2020   AKI (acute kidney injury) ([place]) 11/09/2020   Septic shock ([place]) 11/07/2020   Pain in joint, shoulder region 07/03/2020   Temporal arteritis ([place]) 05/01/2020   Frontal headache 05/30/2019   Carcinoma, renal cell, left ([place]) 11/14/2018   Pulmonary embolism ([place]) 07/13/2018   DDD (degenerative disc disease), lumbar 06/30/2018   Chronic low back pain 06/30/2018   Hip pain, bilateral 06/30/2018   Chronic, continuous use of opioids 06/30/2018   PVC (premature ventricular contraction) 06/04/2017   Chest pain 10/19/2016   Presence of right artificial knee joint 05/18/2016   Pes anserinus bursitis of left knee 05/18/2016   Unilateral primary osteoarthritis, right knee 12/13/2015   Hx of total knee arthroplasty 12/13/2015   Osteoarthritis of left knee 08/02/2015   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   Morbid obesity with BMI of 45.0-49.9, adult ([place]) 07/25/2015   Spinal stenosis of lumbar region 06/19/2015   Chronic pain syndrome 10/30/2014     Past Medical History:  Diagnosis Date   Allergic rhinitis    Arthritis    Asthma    problems with fumes and aerosols cause asthma   Chest pain 10/19/2016   CHF (congestive heart failure) (HCC)    Complication of anesthesia    awakens during surgery; has occurred with last 3-4 surgeries    COPD (chronic obstructive pulmonary disease) ([place])    DVT (deep venous thrombosis) ([place]) 07/13/2018   Right leg   Environmental allergies    fumes    Fibromyalgia    GERD (gastroesophageal reflux disease)    Headache    Hemorrhoids    History of bronchitis    Hx of total knee arthroplasty 12/13/2015   Hyperlipidemia    Hypertension    Morbid obesity with BMI of 45.0-49.9,  adult ([place]) 07/25/2015   NICM (nonischemic cardiomyopathy) ([place])    a. ? PVC mediated;  b. 06/2016 Echo: EF 25-30%, mild LVH, mild LAE, mild AI/MR/TR/PR; c. 08/2016 Cath: nl cors, EF 25%.   Numbness    hands bilat when driving; improves when not preforming task    Osteoarthritis of left knee 08/02/2015   PE (pulmonary thromboembolism) ([place]) 07/13/2018   Pes anserinus bursitis of left knee 05/18/2016   Presence of right artificial knee joint 05/18/2016   PVC (premature ventricular contraction) 06/04/2017   PVC's (premature ventricular contractions)    a. 11/2016 Amio started;  b. 11/29/16 48h Holter: 08144 PVC's (41%); c. 12/2016 24h Holter: 18040 PVC's (15%); c. 02/2017 48h Holter: 37262 PVC's (41%).   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   Temporal arteritis ([place]) 05/01/2020   Tinnitus    comes and goes    Unilateral primary osteoarthritis, right knee 12/13/2015   Vertigo    none for over 2 yrs     Past Surgical History:  Procedure Laterality Date   [place]  left ovary remains   ABDOMINAL HYSTERECTOMY     ARTERY BIOPSY Left 06/07/2019   Procedure: BIOPSY TEMPORAL ARTERY;  Surgeon: Algernon Huxley, MD;  Location: [facility] ORS;  Service: Vascular;  Laterality: Left;   CHOLECYSTECTOMY     COLONOSCOPY     COLONOSCOPY N/A 10/30/2019   Procedure: COLONOSCOPY;  Surgeon: Lesly Rubenstein, MD;  Location: [facility] ENDOSCOPY;  Service: Endoscopy;  Laterality: N/A;   ESOPHAGOGASTRODUODENOSCOPY N/A 10/30/2019   Procedure: ESOPHAGOGASTRODUODENOSCOPY (EGD);  Surgeon: Lesly Rubenstein, MD;  Location: [facility] ENDOSCOPY;  Service: Endoscopy;  Laterality: N/A;   fibrous tissue removed from right shoulder and back of neck      2 years ago    [place] N/A 02/28/2019   Procedure: SALIVARY GLAND BIOPSY;  Surgeon: Carloyn Manner, MD;  Location: [facility] ORS;   Service: ENT;  Laterality: N/A;   FRACTURE SURGERY     left small finger   GANGLION CYST EXCISION     GASTRIC BYPASS  1980   states had bypass with banding and band left in -   GASTRIC BYPASS OPEN     JOINT REPLACEMENT Bilateral 2017   knees   KNEE SURGERY Bilateral    both knees  2001   NECK SURGERY N/A 1205/19   ganglion cyst removal, [place]    PULMONARY THROMBECTOMY Bilateral 07/14/2018   Procedure: PULMONARY THROMBECTOMY;  Surgeon: Algernon Huxley, MD;  Location: [place] CV LAB;  Service: Cardiovascular;  Laterality: Bilateral;   PVC ABLATION N/A 06/04/2017   Procedure: PVC ABLATION;  Surgeon: Evans Lance, MD;  Location: [place] CV LAB;  Service: Cardiovascular;  Laterality: N/A;   RIGHT/LEFT HEART CATH AND CORONARY ANGIOGRAPHY Bilateral 08/12/2016   Procedure: Right/Left Heart Cath and Coronary Angiography;  Surgeon: Yolonda Kida, MD;  Location: [place] [facility];  Service: Cardiovascular;  Laterality: Bilateral;   TONSILLECTOMY     age 20   TOTAL KNEE ARTHROPLASTY Left 08/02/2015   Procedure: LEFT TOTAL KNEE ARTHROPLASTY;  Surgeon: Mcarthur Rossetti, MD;  Location: [facility] ORS;  Service: Orthopedics;  Laterality: Left;   TOTAL KNEE ARTHROPLASTY Right 12/13/2015   Procedure: RIGHT TOTAL KNEE ARTHROPLASTY;  Surgeon: Mcarthur Rossetti, MD;  Location: [facility] ORS;  Service: Orthopedics;  Laterality: Right;    Social History   Socioeconomic History   Marital status: Single    Spouse name: Not on file   Number of children: Not on file   Years of education: Not on file   Highest education level: Not on file  Occupational History   Not on file  Tobacco Use   Smoking status: Former    Packs/day: 0.25    Years: 50.00    Pack years: 12.50    Types: Cigarettes    Quit date: 08/06/2020    Years since quitting: 0.3   Smokeless tobacco: Current    Types: Chew  Vaping Use   Vaping Use: Never used  Substance and Sexual Activity   Alcohol use: No    Alcohol/week:  0.0 standard drinks   Drug use: No   Sexual activity: Not on file  Other Topics Concern   Not on file  Social History Narrative   Not on file   Social Determinants of Health   Financial Resource Strain: Not on file  Food Insecurity: Not on file  Transportation Needs: Not on file  Physical Activity: Not on file  Stress: Not on file  Social Connections: Not on file  Intimate Partner Violence:  Not on file     Family History  Problem Relation Age of Onset   Hypertension Father    Deep vein thrombosis Father    Dementia Mother    Diabetes Sister    Congestive Heart Failure Sister    Congestive Heart Failure Brother    Congestive Heart Failure Daughter    Congestive Heart Failure Son    Breast cancer Maternal Aunt        great aunt     Current Outpatient Medications:    acetaminophen (TYLENOL) 325 MG tablet, Take 2 tablets (650 mg total) by mouth every 6 (six) hours as needed for mild pain or fever., Disp: 60 tablet, Rfl: 0   acidophilus (RISAQUAD) CAPS capsule, Take 1 capsule by mouth in the morning., Disp: , Rfl:    albuterol (PROVENTIL HFA;VENTOLIN HFA) 108 (90 Base) MCG/ACT inhaler, Inhale 2 puffs into the lungs every 6 (six) hours as needed for wheezing or shortness of breath., Disp: , Rfl:    alendronate (FOSAMAX) 35 MG tablet, Take 35 mg by mouth every Saturday. Take with a full glass of water on an empty stomach., Disp: , Rfl:    amoxicillin-clavulanate (AUGMENTIN) 875-125 MG tablet, Take 1 tablet by mouth 2 (two) times daily., Disp: , Rfl:    atorvastatin (LIPITOR) 40 MG tablet, Take 40 mg by mouth daily., Disp: , Rfl:    calcium carbonate (OS-CAL - DOSED IN MG OF ELEMENTAL CALCIUM) 1250 (500 Ca) MG tablet, Take 2 tablets by mouth in the morning., Disp: , Rfl:    diphenhydrAMINE (BENADRYL) 25 mg capsule, Take 25 mg by mouth every 6 (six) hours as needed for itching or allergies., Disp: , Rfl:    docusate sodium (COLACE) 100 MG capsule, Take 200 mg by mouth at bedtime as  needed for mild constipation., Disp: , Rfl:    fluconazole (DIFLUCAN) 100 MG tablet, Take 100 mg by mouth daily., Disp: , Rfl:    folic acid (FOLVITE) 1 MG tablet, Take 1 mg by mouth daily., Disp: , Rfl:    gabapentin (NEURONTIN) 300 MG capsule, Take 600 mg by mouth 2 (two) times daily as needed., Disp: , Rfl:    gabapentin (NEURONTIN) 600 MG tablet, Take 600 mg by mouth 3 (three) times daily., Disp: , Rfl:    ipratropium-albuterol (DUONEB) 0.5-2.5 (3) MG/3ML SOLN, Take 3 mLs by nebulization every 6 (six) hours as needed (shortness of breath)., Disp: , Rfl:    levocetirizine (XYZAL) 5 MG tablet, Take 5 mg by mouth every evening., Disp: , Rfl:    montelukast (SINGULAIR) 10 MG tablet, Take 10 mg by mouth at bedtime., Disp: , Rfl:    nortriptyline (PAMELOR) 50 MG capsule, Take 50 mg by mouth at bedtime., Disp: , Rfl:    nystatin (MYCOSTATIN) 100000 UNIT/ML suspension, Take 5 mLs by mouth 4 (four) times daily., Disp: , Rfl:    nystatin cream (MYCOSTATIN), Apply 1 application topically 2 (two) times daily. (Apply under skin folds), Disp: , Rfl:    oxyCODONE-acetaminophen (PERCOCET) 10-325 MG tablet, Take 1 tablet by mouth every 4 (four) hours as needed for pain., Disp: 150 tablet, Rfl: 0   pantoprazole (PROTONIX) 40 MG tablet, Take 40 mg by mouth 2 (two) times daily., Disp: , Rfl:    polyethylene glycol (MIRALAX / GLYCOLAX) 17 g packet, Take 17 g by mouth daily as needed for moderate constipation., Disp: 14 each, Rfl: 0   predniSONE (DELTASONE) 10 MG tablet, Take 20 mg by mouth in the morning.,  Disp: , Rfl:    sucralfate (CARAFATE) 1 G tablet, Take 1 g by mouth 2 (two) times daily before a meal., Disp: , Rfl:    umeclidinium-vilanterol (ANORO ELLIPTA) 62.5-25 MCG/INH AEPB, Inhale 1 puff into the lungs daily., Disp: , Rfl:    Vitamin D, Ergocalciferol, (DRISDOL) 50000 UNITS CAPS capsule, Take 50,000 Units by mouth every Monday., Disp: , Rfl:    Wound Cleansers (VASHE WOUND THERAPY) SOLN, Apply 1  application topically daily. (Irrigate abdominal wound), Disp: , Rfl:    apixaban (ELIQUIS) 5 MG TABS tablet, Take 1 tablet (5 mg total) by mouth 2 (two) times daily., Disp: 60 tablet, Rfl: 0   ascorbic acid (VITAMIN C) 500 MG tablet, Take 500 mg by mouth 2 (two) times daily. (Patient not taking: Reported on 11/25/2020), Disp: , Rfl:    azelastine (ASTELIN) 0.1 % nasal spray, Place 1 spray into both nostrils 2 (two) times daily. (Patient not taking: Reported on 11/25/2020), Disp: , Rfl:    diclofenac sodium (VOLTAREN) 1 % GEL, Apply 2 g topically in the morning. (Apply to most painful area) (Patient not taking: Reported on 11/25/2020), Disp: , Rfl:    ibuprofen (ADVIL) 600 MG tablet, Take 1 tablet (600 mg total) by mouth every 6 (six) hours as needed for moderate pain (severe pain). (Patient not taking: Reported on 11/25/2020), Disp: 30 tablet, Rfl: 0   magnesium oxide (MAG-OX) 400 MG tablet, Take 800 mg by mouth daily. (Patient not taking: Reported on 11/25/2020), Disp: , Rfl:    metoprolol tartrate (LOPRESSOR) 25 MG tablet, Take 0.5 tablets (12.5 mg total) by mouth daily. (Patient not taking: Reported on 11/25/2020), Disp: 30 tablet, Rfl: 0   Multiple Vitamins-Minerals (MULTIVITAMIN WITH MINERALS) tablet, Take 1 tablet by mouth daily. (Patient not taking: Reported on 11/25/2020), Disp: , Rfl:    sacubitril-valsartan (ENTRESTO) 24-26 MG, Take 1 tablet by mouth 2 (two) times daily. (Patient not taking: Reported on 11/25/2020), Disp: , Rfl:    spironolactone (ALDACTONE) 25 MG tablet, Take 12.5 mg by mouth daily. (Patient not taking: Reported on 11/25/2020), Disp: , Rfl:    torsemide (DEMADEX) 20 MG tablet, Take 2 tablets (40 mg) by mouth once daily (Patient not taking: Reported on 11/25/2020), Disp: 60 tablet, Rfl: 3   Physical exam:  Vitals:   12/09/20 1430  BP: 119/70  Pulse: 96  Resp: 18  Temp: (!) 96.5 F (35.8 C)  TempSrc: Tympanic  SpO2: 99%   Physical Exam Constitutional:       Appearance: She is obese.     Comments: Sitting in a wheelchair.  Appears in no acute distress  Cardiovascular:     Heart sounds: Normal heart sounds.  Pulmonary:     Effort: Pulmonary effort is normal.     Breath sounds: Normal breath sounds.  Musculoskeletal:     Right lower leg: Edema present.     Left lower leg: Edema present.  Skin:    General: Skin is warm and dry.  Neurological:     Mental Status: She is alert and oriented to person, place, and time.     CBC Latest Ref Rng & Units 11/25/2020 11/15/2020 11/13/2020  WBC 4.0 - 10.5 K/uL 9.7 5.5 13.6(H)  Hemoglobin 12.0 - 15.0 g/dL 9.1(L) 8.7(L) 8.9(L)  Hematocrit 36.0 - 46.0 % 30.0(L) 29.6(L) 28.2(L)  Platelets 150 - 400 K/uL 445(H) 382 318   CMP Latest Ref Rng & Units 11/25/2020 11/15/2020 11/13/2020  Glucose 70 - 99 mg/dL 122(H) 77 -  BUN  8 - 23 mg/dL 24(H) 23 -  Creatinine 0.44 - 1.00 mg/dL 1.03(H) 0.99 -  Sodium 135 - 145 mmol/L 139 139 -  Potassium 3.5 - 5.1 mmol/L 4.1 4.1 4.4  Chloride 98 - 111 mmol/L 103 111 -  CO2 22 - 32 mmol/L 26 21(L) -  Calcium 8.9 - 10.3 mg/dL 8.9 8.7(L) -  Total Protein 6.5 - 8.1 g/dL 6.8 - -  Total Bilirubin 0.3 - 1.2 mg/dL 0.3 - -  Alkaline Phos 38 - 126 U/L 67 - -  AST 15 - 41 U/L 20 - -  ALT 0 - 44 U/L 13 - -   Iron/TIBC/Ferritin/ %Sat    Component Value Date/Time   IRON 19 (L) 11/25/2020 1406   TIBC 262 11/25/2020 1406   FERRITIN 87 11/25/2020 1406   IRONPCTSAT 7 (L) 11/25/2020 1406   No images are attached to the encounter.  US Venous Img Lower Bilateral (DVT)  Result Date: 11/10/2020 CLINICAL DATA:  Pain and swelling EXAM: Bilateral LOWER EXTREMITY VENOUS DOPPLER ULTRASOUND TECHNIQUE: Gray-scale sonography with compression, as well as color and duplex ultrasound, were performed to evaluate the deep venous system(s) from the level of the common femoral vein through the popliteal and proximal calf veins. COMPARISON:  07/14/2018 FINDINGS: According to the note by the technologist,  examination was technically difficult due to patient's body habitus. VENOUS Normal compressibility of the common femoral, superficial femoral, and popliteal veins, as well as the visualized calf veins. Visualized portions of profunda femoral vein and great saphenous vein unremarkable. No filling defects to suggest DVT on grayscale or color Doppler imaging. Doppler waveforms show normal direction of venous flow, normal respiratory plasticity and response to augmentation. There is interval resolution of deep venous thrombosis in right popliteal vein since the previous study. OTHER None. Limitations: none IMPRESSION: There is no evidence of deep venous thrombosis in both lower extremities. Electronically Signed   By: Elmer Picker M.D.   On: 11/10/2020 13:34   ECHOCARDIOGRAM COMPLETE BUBBLE STUDY  Result Date: 11/16/2020    ECHOCARDIOGRAM REPORT   Patient Name:   LAVREN LEWAN Luis Date of Exam: 11/10/2020 Medical Rec #:  [id number]       Height:       67.0 in Accession #:    [id number]      Weight:       315.3 lb Date of Birth:  [birth year]       BSA:          2.454 m Patient Age:    39 years        BP:           107/64 mmHg Patient Gender: F               HR:           97 bpm. Exam Location:  [facility] Procedure: Saline Contrast Bubble Study and 2D Echo Indications:     Systolic Heart Failure  History:         Patient has prior history of Echocardiogram examinations. CHF                  and Non Ischemic CM, COPD; Risk Factors:Sleep Apnea and Morbid                  Obesity.  Sonographer:     L Thornton-Maynard Referring Phys:  [id number] ADAM ROSS Foll[place]bee Diagnosing Phys: [place]  1. Left ventricular ejection fraction, by estimation, is 60 to  65%. The left ventricle has normal function. The left ventricle has no regional wall motion abnormalities. Left ventricular diastolic parameters are consistent with Grade I diastolic dysfunction (impaired relaxation).  2. Right ventricular systolic function is  normal. The right ventricular size is normal. There is normal pulmonary artery systolic pressure.  3. The mitral valve is normal in structure. No evidence of mitral valve regurgitation. No evidence of mitral stenosis.  4. The aortic valve is normal in structure. Aortic valve regurgitation is not visualized. No aortic stenosis is present.  5. The inferior vena cava is normal in size with greater than 50% respiratory variability, suggesting right atrial pressure of 3 mmHg. FINDINGS  Left Ventricle: Left ventricular ejection fraction, by estimation, is 60 to 65%. The left ventricle has normal function. The left ventricle has no regional wall motion abnormalities. The left ventricular internal cavity size was normal in size. There is  no left ventricular hypertrophy. Left ventricular diastolic parameters are consistent with Grade I diastolic dysfunction (impaired relaxation). Right Ventricle: The right ventricular size is normal. No increase in right ventricular wall thickness. Right ventricular systolic function is normal. There is normal pulmonary artery systolic pressure. The tricuspid regurgitant velocity is 2.51 m/s, and  with an assumed right atrial pressure of 3 mmHg, the estimated right ventricular systolic pressure is 60.1 mmHg. Left Atrium: Left atrial size was normal in size. Right Atrium: Right atrial size was normal in size. Pericardium: There is no evidence of pericardial effusion. Mitral Valve: The mitral valve is normal in structure. No evidence of mitral valve regurgitation. No evidence of mitral valve stenosis. MV peak gradient, 5.6 mmHg. The mean mitral valve gradient is 3.0 mmHg. Tricuspid Valve: The tricuspid valve is normal in structure. Tricuspid valve regurgitation is not demonstrated. No evidence of tricuspid stenosis. Aortic Valve: The aortic valve is normal in structure. Aortic valve regurgitation is not visualized. No aortic stenosis is present. Aortic valve mean gradient measures 8.0 mmHg.  Aortic valve peak gradient measures 16.3 mmHg. Aortic valve area, by VTI measures 2.08 cm. Pulmonic Valve: The pulmonic valve was normal in structure. Pulmonic valve regurgitation is not visualized. No evidence of pulmonic stenosis. Aorta: The aortic root is normal in size and structure. Venous: The inferior vena cava is normal in size with greater than 50% respiratory variability, suggesting right atrial pressure of 3 mmHg. IAS/Shunts: No atrial level shunt detected by color flow Doppler. Agitated saline contrast was given intravenously to evaluate for intracardiac shunting.  LEFT VENTRICLE PLAX 2D LVIDd:         4.90 cm   Diastology LVIDs:         2.70 cm   LV e' medial:    6.20 cm/s LV PW:         1.00 cm   LV E/e' medial:  14.5 LV IVS:        1.10 cm   LV e' lateral:   5.55 cm/s LVOT diam:     2.30 cm   LV E/e' lateral: 16.2 LV SV:         62 LV SV Index:   25 LVOT Area:     4.15 cm  RIGHT VENTRICLE RV S prime:     14.90 cm/s TAPSE (M-mode): 2.1 cm LEFT ATRIUM           Index LA diam:      2.70 cm 1.10 cm/m LA Vol (A2C): 33.4 ml 13.61 ml/m LA Vol (A4C): 41.1 ml 16.75 ml/m  AORTIC VALVE  PULMONIC VALVE AV Area (Vmax):    1.99 cm      PV Vmax:       1.03 m/s AV Area (Vmean):   2.14 cm      PV Peak grad:  4.2 mmHg AV Area (VTI):     2.08 cm AV Vmax:           202.00 cm/s AV Vmean:          138.000 cm/s AV VTI:            0.300 m AV Peak Grad:      16.3 mmHg AV Mean Grad:      8.0 mmHg LVOT Vmax:         96.60 cm/s LVOT Vmean:        71.000 cm/s LVOT VTI:          0.150 m LVOT/AV VTI ratio: 0.50  AORTA Ao Root diam: 3.80 cm MITRAL VALVE               TRICUSPID VALVE MV Area VTI:  2.41 cm     TR Peak grad:   25.2 mmHg MV Peak grad: 5.6 mmHg     TR Vmax:        251.00 cm/s MV Mean grad: 3.0 mmHg MV Vmax:      1.18 m/s     SHUNTS MV Vmean:     85.3 cm/s    Systemic VTI:  0.15 m MV E velocity: 90.00 cm/s  Systemic Diam: 2.30 cm MV A velocity: 96.00 cm/s MV E/A ratio:  0.94 Shaukat Khan  Electronically signed by Neoma Laming Signature Date/Time: 11/16/2020/12:09:07 PM    Final     Assessment and plan- Patient is a 70 y.o. female   Pulmonary embolism- hx of massive PE with right heart strain in July 2020, seemingly unprovoked. Additionally DVT of right popliteal at that time. S/p thrombectomy for PE and started Eliquis at that time. Given her history of massive, unprovoked DVT, morbid obesity, generalized poor mobility, and other risk factors for recurrent PE, I'd recommend continuing lifelong anticoagulation unless she has bleeding or other contraindications or complications. I wouldn't recommend hypercoagulable work up at this time given recommendation to continue anticoagulation regardless. Continue eliquis.  Anemia- during recent hospitalization for septic shock, her hemoglobin decreased to 8s. Today, improving to 9.1. Normocytic. Additional anemia work up was performed and was either negative or unremarkable. Haptoglobin elevated which supports anemia of chronic disease. Recommend monitoring blood counts every 3 months.   RTC in 3 months for labs (cbc, cmp, ferritin, iron studies)   Thank you for the opportunity to participate in the care of this patient   Visit Diagnosis No diagnosis found.  Beckey Rutter, DNP, AGNP-C [place] at [facility] [phone] (clinic) 12/09/2020

## 2021-01-15 ENCOUNTER — Other Ambulatory Visit: Payer: Self-pay

## 2021-01-15 ENCOUNTER — Encounter: Payer: Self-pay | Admitting: Anesthesiology

## 2021-01-15 ENCOUNTER — Ambulatory Visit: Payer: Medicare Other | Attending: Anesthesiology | Admitting: Anesthesiology

## 2021-01-15 DIAGNOSIS — M48062 Spinal stenosis, lumbar region with neurogenic claudication: Secondary | ICD-10-CM

## 2021-01-15 DIAGNOSIS — F1190 Opioid use, unspecified, uncomplicated: Secondary | ICD-10-CM | POA: Diagnosis not present

## 2021-01-15 DIAGNOSIS — M5441 Lumbago with sciatica, right side: Secondary | ICD-10-CM

## 2021-01-15 DIAGNOSIS — M5136 Other intervertebral disc degeneration, lumbar region: Secondary | ICD-10-CM

## 2021-01-15 DIAGNOSIS — M25511 Pain in right shoulder: Secondary | ICD-10-CM

## 2021-01-15 DIAGNOSIS — M1712 Unilateral primary osteoarthritis, left knee: Secondary | ICD-10-CM

## 2021-01-15 DIAGNOSIS — M19011 Primary osteoarthritis, right shoulder: Secondary | ICD-10-CM

## 2021-01-15 DIAGNOSIS — M25551 Pain in right hip: Secondary | ICD-10-CM

## 2021-01-15 DIAGNOSIS — G8929 Other chronic pain: Secondary | ICD-10-CM

## 2021-01-15 DIAGNOSIS — G894 Chronic pain syndrome: Secondary | ICD-10-CM

## 2021-01-15 DIAGNOSIS — M316 Other giant cell arteritis: Secondary | ICD-10-CM

## 2021-01-15 DIAGNOSIS — M5442 Lumbago with sciatica, left side: Secondary | ICD-10-CM | POA: Diagnosis not present

## 2021-01-15 DIAGNOSIS — M25552 Pain in left hip: Secondary | ICD-10-CM

## 2021-01-15 MED ORDER — GABAPENTIN 600 MG PO TABS: 600.0000 mg | ORAL_TABLET | Freq: Three times a day (TID) | ORAL | 3 refills | Status: DC

## 2021-01-15 MED ORDER — OXYCODONE-ACETAMINOPHEN 10-325 MG PO TABS: 1.0000 | ORAL_TABLET | ORAL | 0 refills | Status: DC | PRN

## 2021-01-15 MED ORDER — OXYCODONE-ACETAMINOPHEN 10-325 MG PO TABS
1.0000 | ORAL_TABLET | ORAL | 0 refills | Status: DC | PRN
Start: 2021-01-25 — End: 2021-02-08

## 2021-01-15 NOTE — Progress Notes (Signed)
Virtual Visit via Telephone Note  I connected with Wendy Sanchez on 01/15/21 at  2:00 PM EST by telephone and verified that I am speaking with the correct person using two identifiers.  Location: Patient: Home Provider: Pain control center   I discussed the limitations, risks, security and privacy concerns of performing an evaluation and management service by telephone and the availability of in person appointments. I also discussed with the patient that there may be a patient responsible charge related to this service. The patient expressed understanding and agreed to proceed.   History of Present Illness: I spoke with Wendy Sanchez today via telephone as she was unable to link with the video portion of our conference.  She is currently in the hospital at [facility] and is therefore a sepsis work-up for a bacterial infection.  She states that she is being discharged to rehab today for stepdown care.  She feels that overall she is doing reasonably well and has been on her opioid pharmacologic regimen without change and this is working well for her back pain.  It has not interfered with any of her sepsis work-up or caused her any other side effects.  The quality characteristic and distribution of the low back pain and leg pain has been stable.  Otherwise she states she has contracted COVID twice in the last few months and they are currently weaning her steroid dose to 20 mg/day.  No other change regarding her pain syndrome is noted today.  Review of systems: General: No fevers or chills Pulmonary: No shortness of breath or dyspnea Cardiac: No angina or palpitations or lightheadedness GI: No abdominal pain or constipation Psych: No depression    Observations/Objective:  Current Outpatient Medications:    [START ON 01/25/2021] oxyCODONE-acetaminophen (PERCOCET) 10-325 MG tablet, Take 1 tablet by mouth every 4 (four) hours as needed for pain., Disp: 150 tablet, Rfl: 0   [START ON 02/24/2021]  oxyCODONE-acetaminophen (PERCOCET) 10-325 MG tablet, Take 1 tablet by mouth every 4 (four) hours as needed for pain., Disp: 150 tablet, Rfl: 0   acetaminophen (TYLENOL) 325 MG tablet, Take 2 tablets (650 mg total) by mouth every 6 (six) hours as needed for mild pain or fever., Disp: 60 tablet, Rfl: 0   acidophilus (RISAQUAD) CAPS capsule, Take 1 capsule by mouth in the morning., Disp: , Rfl:    albuterol (PROVENTIL HFA;VENTOLIN HFA) 108 (90 Base) MCG/ACT inhaler, Inhale 2 puffs into the lungs every 6 (six) hours as needed for wheezing or shortness of breath., Disp: , Rfl:    alendronate (FOSAMAX) 35 MG tablet, Take 35 mg by mouth every Saturday. Take with a full glass of water on an empty stomach., Disp: , Rfl:    amoxicillin-clavulanate (AUGMENTIN) 875-125 MG tablet, Take 1 tablet by mouth 2 (two) times daily., Disp: , Rfl:    apixaban (ELIQUIS) 5 MG TABS tablet, Take 1 tablet (5 mg total) by mouth 2 (two) times daily., Disp: 60 tablet, Rfl: 0   ascorbic acid (VITAMIN C) 500 MG tablet, Take 500 mg by mouth 2 (two) times daily. (Patient not taking: Reported on 11/25/2020), Disp: , Rfl:    atorvastatin (LIPITOR) 40 MG tablet, Take 40 mg by mouth daily., Disp: , Rfl:    azelastine (ASTELIN) 0.1 % nasal spray, Place 1 spray into both nostrils 2 (two) times daily. (Patient not taking: Reported on 11/25/2020), Disp: , Rfl:    calcium carbonate (OS-CAL - DOSED IN MG OF ELEMENTAL CALCIUM) 1250 (500 Ca) MG tablet, Take 2  tablets by mouth in the morning., Disp: , Rfl:    diclofenac sodium (VOLTAREN) 1 % GEL, Apply 2 g topically in the morning. (Apply to most painful area) (Patient not taking: Reported on 11/25/2020), Disp: , Rfl:    diphenhydrAMINE (BENADRYL) 25 mg capsule, Take 25 mg by mouth every 6 (six) hours as needed for itching or allergies., Disp: , Rfl:    docusate sodium (COLACE) 100 MG capsule, Take 200 mg by mouth at bedtime as needed for mild constipation., Disp: , Rfl:    fluconazole (DIFLUCAN)  100 MG tablet, Take 100 mg by mouth daily., Disp: , Rfl:    folic acid (FOLVITE) 1 MG tablet, Take 1 mg by mouth daily., Disp: , Rfl:    gabapentin (NEURONTIN) 300 MG capsule, Take 600 mg by mouth 2 (two) times daily as needed., Disp: , Rfl:    gabapentin (NEURONTIN) 600 MG tablet, Take 1 tablet (600 mg total) by mouth 3 (three) times daily., Disp: 90 tablet, Rfl: 3   ibuprofen (ADVIL) 600 MG tablet, Take 1 tablet (600 mg total) by mouth every 6 (six) hours as needed for moderate pain (severe pain). (Patient not taking: Reported on 11/25/2020), Disp: 30 tablet, Rfl: 0   ipratropium-albuterol (DUONEB) 0.5-2.5 (3) MG/3ML SOLN, Take 3 mLs by nebulization every 6 (six) hours as needed (shortness of breath)., Disp: , Rfl:    levocetirizine (XYZAL) 5 MG tablet, Take 5 mg by mouth every evening., Disp: , Rfl:    magnesium oxide (MAG-OX) 400 MG tablet, Take 800 mg by mouth daily. (Patient not taking: Reported on 11/25/2020), Disp: , Rfl:    metoprolol tartrate (LOPRESSOR) 25 MG tablet, Take 0.5 tablets (12.5 mg total) by mouth daily. (Patient not taking: Reported on 11/25/2020), Disp: 30 tablet, Rfl: 0   montelukast (SINGULAIR) 10 MG tablet, Take 10 mg by mouth at bedtime., Disp: , Rfl:    Multiple Vitamins-Minerals (MULTIVITAMIN WITH MINERALS) tablet, Take 1 tablet by mouth daily. (Patient not taking: Reported on 11/25/2020), Disp: , Rfl:    nortriptyline (PAMELOR) 50 MG capsule, Take 50 mg by mouth at bedtime., Disp: , Rfl:    nystatin (MYCOSTATIN) 100000 UNIT/ML suspension, Take 5 mLs by mouth 4 (four) times daily., Disp: , Rfl:    nystatin cream (MYCOSTATIN), Apply 1 application topically 2 (two) times daily. (Apply under skin folds), Disp: , Rfl:    pantoprazole (PROTONIX) 40 MG tablet, Take 40 mg by mouth 2 (two) times daily., Disp: , Rfl:    polyethylene glycol (MIRALAX / GLYCOLAX) 17 g packet, Take 17 g by mouth daily as needed for moderate constipation., Disp: 14 each, Rfl: 0   predniSONE  (DELTASONE) 10 MG tablet, Take 20 mg by mouth in the morning., Disp: , Rfl:    sacubitril-valsartan (ENTRESTO) 24-26 MG, Take 1 tablet by mouth 2 (two) times daily. (Patient not taking: Reported on 11/25/2020), Disp: , Rfl:    spironolactone (ALDACTONE) 25 MG tablet, Take 12.5 mg by mouth daily. (Patient not taking: Reported on 11/25/2020), Disp: , Rfl:    sucralfate (CARAFATE) 1 G tablet, Take 1 g by mouth 2 (two) times daily before a meal., Disp: , Rfl:    torsemide (DEMADEX) 20 MG tablet, Take 2 tablets (40 mg) by mouth once daily (Patient not taking: Reported on 11/25/2020), Disp: 60 tablet, Rfl: 3   umeclidinium-vilanterol (ANORO ELLIPTA) 62.5-25 MCG/INH AEPB, Inhale 1 puff into the lungs daily., Disp: , Rfl:    Vitamin D, Ergocalciferol, (DRISDOL) 50000 UNITS CAPS capsule, Take  50,000 Units by mouth every Monday., Disp: , Rfl:    Wound Cleansers (VASHE WOUND THERAPY) SOLN, Apply 1 application topically daily. (Irrigate abdominal wound), Disp: , Rfl:   Past Medical History:  Diagnosis Date   Allergic rhinitis    Arthritis    Asthma    problems with fumes and aerosols cause asthma   Chest pain 10/19/2016   CHF (congestive heart failure) (HCC)    Complication of anesthesia    awakens during surgery; has occurred with last 3-4 surgeries    COPD (chronic obstructive pulmonary disease) ([place])    DVT (deep venous thrombosis) (HCC) 07/13/2018   Right leg   Environmental allergies    fumes    Fibromyalgia    GERD (gastroesophageal reflux disease)    Headache    Hemorrhoids    History of bronchitis    Hx of total knee arthroplasty 12/13/2015   Hyperlipidemia    Hypertension    Morbid obesity with BMI of 45.0-49.9, adult ([place]) 07/25/2015   NICM (nonischemic cardiomyopathy) ([place])    a. ? PVC mediated;  b. 06/2016 Echo: EF 25-30%, mild LVH, mild LAE, mild AI/MR/TR/PR; c. 08/2016 Cath: nl cors, EF 25%.   Numbness    hands bilat when driving; improves when not preforming task    Osteoarthritis  of left knee 08/02/2015   PE (pulmonary thromboembolism) ([place]) 07/13/2018   Pes anserinus bursitis of left knee 05/18/2016   Presence of right artificial knee joint 05/18/2016   PVC (premature ventricular contraction) 06/04/2017   PVC's (premature ventricular contractions)    a. 11/2016 [place] started;  b. 11/29/16 48h Holter: 24580 PVC's (41%); c. 12/2016 24h Holter: 18040 PVC's (15%); c. 02/2017 48h Holter: 37262 PVC's (41%).   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 DDD (degenerative disc disease), lumbar   2. Chronic bilateral low back pain with bilateral sciatica   3. Chronic, continuous use of opioids   4. Chronic pain syndrome   5. Hip pain, bilateral   6. Spinal stenosis of lumbar region with neurogenic claudication   7. Pain in joint of right shoulder   8. Temporal arteritis ([place])   9. Primary osteoarthritis of right shoulder   10. Primary osteoarthritis of left knee   11. Chronic pain of right hip   12. Osteoarthritis of left knee, unspecified osteoarthritis type   Unfortunately she has required hospitalization for this unknown bacterial infection with sepsis work-up but it seems that her status is stable.  She is due to be transferred to rehab today.  She is doing well with her ongoing use of Percocet 10/15/2023 5 times a day and this is worked well for her in the hospital and she has been stable with the regimen.  Her back pain has responded favorably to it and no other changes in her regimen are warranted at this time.  Going to refill her medicines for the next 2 months dated for January 14 and February 13.  We will schedule her for return in 2 months with continue follow-up  with her primary care physicians and hospitalist. Follow Up Instructions:  I discussed the assessment and treatment plan with the patient. The patient was provided an opportunity to ask questions and all were answered. The patient agreed with the plan and demonstrated an understanding of the instructions.   The patient was advised to call back or seek an in-person evaluation if the symptoms worsen or if the condition fails to improve as anticipated.  I provided 30 minutes of non-face-to-face time during this encounter.   Molli Barrows, MD

## 2021-01-30 ENCOUNTER — Inpatient Hospital Stay
Admission: EM | Admit: 2021-01-30 | Discharge: 2021-02-08 | DRG: 872 | Disposition: A | Discharge status: Home Health | Payer: Medicare Other | Source: Skilled Nursing Facility | Attending: Internal Medicine | Admitting: Internal Medicine

## 2021-01-30 ENCOUNTER — Emergency Department: Payer: Medicare Other

## 2021-01-30 ENCOUNTER — Other Ambulatory Visit: Payer: Self-pay

## 2021-01-30 DIAGNOSIS — Z86711 Personal history of pulmonary embolism: Secondary | ICD-10-CM | POA: Diagnosis not present

## 2021-01-30 DIAGNOSIS — M316 Other giant cell arteritis: Secondary | ICD-10-CM | POA: Diagnosis present

## 2021-01-30 DIAGNOSIS — I428 Other cardiomyopathies: Secondary | ICD-10-CM | POA: Diagnosis present

## 2021-01-30 DIAGNOSIS — Z96653 Presence of artificial knee joint, bilateral: Secondary | ICD-10-CM | POA: Diagnosis present

## 2021-01-30 DIAGNOSIS — Z20822 Contact with and (suspected) exposure to covid-19: Secondary | ICD-10-CM | POA: Diagnosis present

## 2021-01-30 DIAGNOSIS — Z720 Tobacco use: Secondary | ICD-10-CM | POA: Diagnosis not present

## 2021-01-30 DIAGNOSIS — A419 Sepsis, unspecified organism: Principal | ICD-10-CM | POA: Diagnosis present

## 2021-01-30 DIAGNOSIS — Z85528 Personal history of other malignant neoplasm of kidney: Secondary | ICD-10-CM | POA: Diagnosis not present

## 2021-01-30 DIAGNOSIS — Z7901 Long term (current) use of anticoagulants: Secondary | ICD-10-CM

## 2021-01-30 DIAGNOSIS — G894 Chronic pain syndrome: Secondary | ICD-10-CM | POA: Diagnosis present

## 2021-01-30 DIAGNOSIS — Z7952 Long term (current) use of systemic steroids: Secondary | ICD-10-CM

## 2021-01-30 DIAGNOSIS — Z6841 Body Mass Index (BMI) 40.0 and over, adult: Secondary | ICD-10-CM

## 2021-01-30 DIAGNOSIS — J449 Chronic obstructive pulmonary disease, unspecified: Secondary | ICD-10-CM | POA: Diagnosis present

## 2021-01-30 DIAGNOSIS — M797 Fibromyalgia: Secondary | ICD-10-CM | POA: Diagnosis present

## 2021-01-30 DIAGNOSIS — I5032 Chronic diastolic (congestive) heart failure: Secondary | ICD-10-CM | POA: Diagnosis present

## 2021-01-30 DIAGNOSIS — Y848 Other medical procedures as the cause of abnormal reaction of the patient, or of later complication, without mention of misadventure at the time of the procedure: Secondary | ICD-10-CM | POA: Diagnosis present

## 2021-01-30 DIAGNOSIS — Z833 Family history of diabetes mellitus: Secondary | ICD-10-CM

## 2021-01-30 DIAGNOSIS — I110 Hypertensive heart disease with heart failure: Secondary | ICD-10-CM | POA: Diagnosis present

## 2021-01-30 DIAGNOSIS — I700 Atherosclerosis of aorta: Secondary | ICD-10-CM | POA: Diagnosis present

## 2021-01-30 DIAGNOSIS — Z91041 Radiographic dye allergy status: Secondary | ICD-10-CM

## 2021-01-30 DIAGNOSIS — N179 Acute kidney failure, unspecified: Secondary | ICD-10-CM | POA: Diagnosis present

## 2021-01-30 DIAGNOSIS — Z8249 Family history of ischemic heart disease and other diseases of the circulatory system: Secondary | ICD-10-CM

## 2021-01-30 DIAGNOSIS — R6520 Severe sepsis without septic shock: Secondary | ICD-10-CM | POA: Diagnosis present

## 2021-01-30 DIAGNOSIS — M4854XA Collapsed vertebra, not elsewhere classified, thoracic region, initial encounter for fracture: Secondary | ICD-10-CM | POA: Diagnosis present

## 2021-01-30 DIAGNOSIS — F1190 Opioid use, unspecified, uncomplicated: Secondary | ICD-10-CM | POA: Diagnosis not present

## 2021-01-30 DIAGNOSIS — Z91048 Other nonmedicinal substance allergy status: Secondary | ICD-10-CM

## 2021-01-30 DIAGNOSIS — K5792 Diverticulitis of intestine, part unspecified, without perforation or abscess without bleeding: Secondary | ICD-10-CM | POA: Diagnosis present

## 2021-01-30 DIAGNOSIS — I959 Hypotension, unspecified: Secondary | ICD-10-CM | POA: Diagnosis present

## 2021-01-30 DIAGNOSIS — Z79899 Other long term (current) drug therapy: Secondary | ICD-10-CM

## 2021-01-30 DIAGNOSIS — Z885 Allergy status to narcotic agent status: Secondary | ICD-10-CM

## 2021-01-30 DIAGNOSIS — Z9049 Acquired absence of other specified parts of digestive tract: Secondary | ICD-10-CM

## 2021-01-30 DIAGNOSIS — Z933 Colostomy status: Secondary | ICD-10-CM | POA: Diagnosis not present

## 2021-01-30 DIAGNOSIS — R000 Tachycardia, unspecified: Secondary | ICD-10-CM | POA: Diagnosis present

## 2021-01-30 DIAGNOSIS — Z86718 Personal history of other venous thrombosis and embolism: Secondary | ICD-10-CM

## 2021-01-30 DIAGNOSIS — Z803 Family history of malignant neoplasm of breast: Secondary | ICD-10-CM

## 2021-01-30 DIAGNOSIS — Z91013 Allergy to seafood: Secondary | ICD-10-CM

## 2021-01-30 DIAGNOSIS — Z79891 Long term (current) use of opiate analgesic: Secondary | ICD-10-CM

## 2021-01-30 DIAGNOSIS — D849 Immunodeficiency, unspecified: Secondary | ICD-10-CM | POA: Diagnosis present

## 2021-01-30 DIAGNOSIS — S22020A Wedge compression fracture of second thoracic vertebra, initial encounter for closed fracture: Secondary | ICD-10-CM | POA: Diagnosis present

## 2021-01-30 DIAGNOSIS — D509 Iron deficiency anemia, unspecified: Secondary | ICD-10-CM | POA: Diagnosis present

## 2021-01-30 DIAGNOSIS — D75839 Thrombocytosis, unspecified: Secondary | ICD-10-CM | POA: Diagnosis present

## 2021-01-30 DIAGNOSIS — E785 Hyperlipidemia, unspecified: Secondary | ICD-10-CM | POA: Diagnosis present

## 2021-01-30 DIAGNOSIS — I5030 Unspecified diastolic (congestive) heart failure: Secondary | ICD-10-CM | POA: Diagnosis not present

## 2021-01-30 DIAGNOSIS — R6510 Systemic inflammatory response syndrome (SIRS) of non-infectious origin without acute organ dysfunction: Secondary | ICD-10-CM | POA: Diagnosis not present

## 2021-01-30 DIAGNOSIS — K5732 Diverticulitis of large intestine without perforation or abscess without bleeding: Secondary | ICD-10-CM | POA: Diagnosis not present

## 2021-01-30 DIAGNOSIS — Z95828 Presence of other vascular implants and grafts: Secondary | ICD-10-CM

## 2021-01-30 DIAGNOSIS — Z818 Family history of other mental and behavioral disorders: Secondary | ICD-10-CM

## 2021-01-30 DIAGNOSIS — Z9884 Bariatric surgery status: Secondary | ICD-10-CM

## 2021-01-30 DIAGNOSIS — K219 Gastro-esophageal reflux disease without esophagitis: Secondary | ICD-10-CM | POA: Diagnosis present

## 2021-01-30 LAB — RESP PANEL BY RT-PCR (FLU A&B, COVID) ARPGX2
Influenza A by PCR: NEGATIVE
Influenza B by PCR: NEGATIVE
SARS Coronavirus 2 by RT PCR: NEGATIVE

## 2021-01-30 LAB — LACTIC ACID, PLASMA
Lactic Acid, Venous: 2.4 mmol/L (ref 0.5–1.9)
Lactic Acid, Venous: 2.7 mmol/L (ref 0.5–1.9)
Lactic Acid, Venous: 2.1 mmol/L (ref 0.5–1.9)
Lactic Acid, Venous: 1.2 mmol/L (ref 0.5–1.9)

## 2021-01-30 LAB — BASIC METABOLIC PANEL
Anion gap: 12 (ref 5–15)
BUN: 26 mg/dL — ABNORMAL HIGH (ref 8–23)
CO2: 30 mmol/L (ref 22–32)
Calcium: 9.1 mg/dL (ref 8.9–10.3)
Chloride: 95 mmol/L — ABNORMAL LOW (ref 98–111)
Creatinine, Ser: 1.47 mg/dL — ABNORMAL HIGH (ref 0.44–1.00)
GFR, Estimated: 38 mL/min — ABNORMAL LOW (ref >60)
Glucose, Bld: 107 mg/dL — ABNORMAL HIGH (ref 70–99)
Potassium: 3.5 mmol/L (ref 3.5–5.1)
Sodium: 137 mmol/L (ref 135–145)

## 2021-01-30 LAB — CBC
HCT: 32.6 % — ABNORMAL LOW (ref 36.0–46.0)
Hemoglobin: 9.7 g/dL — ABNORMAL LOW (ref 12.0–15.0)
MCH: 23 pg — ABNORMAL LOW (ref 26.0–34.0)
MCHC: 29.8 g/dL — ABNORMAL LOW (ref 30.0–36.0)
MCV: 77.3 fL — ABNORMAL LOW (ref 80.0–100.0)
Platelets: 440 10*3/uL — ABNORMAL HIGH (ref 150–400)
RBC: 4.22 MIL/uL (ref 3.87–5.11)
RDW: 19.8 % — ABNORMAL HIGH (ref 11.5–15.5)
WBC: 12.5 10*3/uL — ABNORMAL HIGH (ref 4.0–10.5)
nRBC: 0 % (ref 0.0–0.2)

## 2021-01-30 LAB — URINALYSIS, COMPLETE (UACMP) WITH MICROSCOPIC
Bacteria, UA: NONE SEEN
Bilirubin Urine: NEGATIVE
Glucose, UA: NEGATIVE mg/dL
Hgb urine dipstick: NEGATIVE
Ketones, ur: NEGATIVE mg/dL
Leukocytes,Ua: NEGATIVE
Nitrite: NEGATIVE
Protein, ur: NEGATIVE mg/dL
Specific Gravity, Urine: 1.011 (ref 1.005–1.030)
pH: 5 (ref 5.0–8.0)

## 2021-01-30 LAB — PROTIME-INR
INR: 1.3 — ABNORMAL HIGH (ref 0.8–1.2)
Prothrombin Time: 16 seconds — ABNORMAL HIGH (ref 11.4–15.2)

## 2021-01-30 LAB — APTT: aPTT: 32 seconds (ref 24–36)

## 2021-01-30 MED ORDER — IPRATROPIUM-ALBUTEROL 0.5-2.5 (3) MG/3ML IN SOLN: 3.0000 mL | Freq: Four times a day (QID) | RESPIRATORY_TRACT | Status: DC | PRN

## 2021-01-30 MED ORDER — SODIUM CHLORIDE 0.9 % IV BOLUS
1000.0000 mL | Freq: Once | INTRAVENOUS | Status: AC
  Administered 2021-01-30: 1000 mL via INTRAVENOUS

## 2021-01-30 MED ORDER — LACTATED RINGERS IV SOLN: INTRAVENOUS | Status: AC

## 2021-01-30 MED ORDER — VANCOMYCIN HCL IN DEXTROSE 1-5 GM/200ML-% IV SOLN: 1000.0000 mg | Freq: Once | INTRAVENOUS | Status: DC

## 2021-01-30 MED ORDER — IBUPROFEN 600 MG PO TABS
600.0000 mg | ORAL_TABLET | Freq: Once | ORAL | Status: AC
  Administered 2021-01-30: 600 mg via ORAL
  Filled 2021-01-30: qty 1

## 2021-01-30 MED ORDER — METRONIDAZOLE 500 MG/100ML IV SOLN
500.0000 mg | Freq: Two times a day (BID) | INTRAVENOUS | Status: DC
  Administered 2021-01-31 (×2): 500 mg via INTRAVENOUS
  Filled 2021-01-30 (×4): qty 100

## 2021-01-30 MED ORDER — VANCOMYCIN HCL 2000 MG/400ML IV SOLN
2000.0000 mg | Freq: Once | INTRAVENOUS | Status: AC
  Administered 2021-01-30: 2000 mg via INTRAVENOUS
  Filled 2021-01-30: qty 400

## 2021-01-30 MED ORDER — ONDANSETRON HCL 4 MG PO TABS: 4.0000 mg | ORAL_TABLET | Freq: Four times a day (QID) | ORAL | Status: DC | PRN

## 2021-01-30 MED ORDER — SODIUM CHLORIDE 0.9 % IV SOLN
2.0000 g | Freq: Once | INTRAVENOUS | Status: AC
  Administered 2021-01-30: 2 g via INTRAVENOUS
  Filled 2021-01-30: qty 2

## 2021-01-30 MED ORDER — OXYCODONE-ACETAMINOPHEN 10-325 MG PO TABS: 1.0000 | ORAL_TABLET | ORAL | Status: DC | PRN

## 2021-01-30 MED ORDER — ACETAMINOPHEN 650 MG RE SUPP: 650.0000 mg | Freq: Four times a day (QID) | RECTAL | Status: DC | PRN

## 2021-01-30 MED ORDER — ACETAMINOPHEN 325 MG PO TABS
650.0000 mg | ORAL_TABLET | Freq: Four times a day (QID) | ORAL | Status: DC | PRN
  Administered 2021-01-31 – 2021-02-07 (×6): 650 mg via ORAL
  Filled 2021-01-30 (×6): qty 2

## 2021-01-30 MED ORDER — PREDNISONE 20 MG PO TABS
20.0000 mg | ORAL_TABLET | Freq: Every morning | ORAL | Status: DC
  Administered 2021-01-31: 20 mg via ORAL
  Filled 2021-01-30: qty 1

## 2021-01-30 MED ORDER — METRONIDAZOLE 500 MG/100ML IV SOLN
500.0000 mg | Freq: Once | INTRAVENOUS | Status: AC
  Administered 2021-01-30: 500 mg via INTRAVENOUS
  Filled 2021-01-30: qty 100

## 2021-01-30 MED ORDER — ONDANSETRON HCL 4 MG/2ML IJ SOLN: 4.0000 mg | Freq: Four times a day (QID) | INTRAMUSCULAR | Status: DC | PRN

## 2021-01-30 MED ORDER — APIXABAN 5 MG PO TABS
5.0000 mg | ORAL_TABLET | Freq: Two times a day (BID) | ORAL | Status: DC
  Administered 2021-01-31 – 2021-02-08 (×17): 5 mg via ORAL
  Filled 2021-01-30 (×17): qty 1

## 2021-01-30 NOTE — ED Triage Notes (Signed)
Pt to ED [facility] from [facility] for chills and generalized bodyaches. Pt alert and oriented. NAD noted Pt currently on antibiotics for "infection" Last tylenol at 1330 today

## 2021-01-30 NOTE — Sepsis Progress Note (Signed)
Elink is following this code sepsis ?

## 2021-01-30 NOTE — ED Notes (Signed)
Pt to radiology via stretcher at this time.  

## 2021-01-30 NOTE — ED Triage Notes (Signed)
Pt comes into the ED via EMS from [facility] "shivering and has a HA" is on abx for diverticulitis. A/ox4  116/62 Temp 99.2 HR113 100%RA

## 2021-01-30 NOTE — Consult Note (Signed)
CODE SEPSIS - PHARMACY COMMUNICATION  **Broad Spectrum Antibiotics should be administered within 1 hour of Sepsis diagnosis**  Time Code Sepsis Called/Page Received: 1752  Antibiotics Ordered: 1752  Time of 1st antibiotic administration: [place] ,PharmD Clinical Pharmacist  01/30/2021  5:59 PM

## 2021-01-30 NOTE — ED Provider Notes (Signed)
[facility] Provider Note    Event Date/Time   First MD Initiated Contact with Patient 01/30/21 1742     (approximate)  History   Chief Complaint: Chills  HPI  Wendy Sanchez is a 71 y.o. female with a past medical history of CHF, COPD, fibromyalgia, obesity, presents to the emergency department for chills and shaking.  Patient found to be febrile to one 1.5 in the emergency department tachycardic as well.  Son is here with the patient states the patient has a complicated history of multiple infections over the past 2 months or so without being able to find a source.  He states the patient is currently on antibiotics, but unclear for what.  I reviewed the patient's chart including her recent discharge summary 11/17/2020 with the patient was admitted for septic shock but does not appear that they found a source, although they do note the patient suffered from perforated sigmoid diverticulitis in September at [facility].  Here patient denies any cough or congestion.  Denies any significant abdominal pain or vomiting.  Patient has a colostomy.  No dysuria.   Physical Exam   Triage Vital Signs: ED Triage Vitals  Enc Vitals Group     BP 01/30/21 1616 132/67     Pulse Rate 01/30/21 1616 (!) 116     Resp 01/30/21 1616 20     Temp 01/30/21 1616 (!) 101.5 F (38.6 C)     Temp Source 01/30/21 1616 Oral     SpO2 01/30/21 1616 95 %     Weight 01/30/21 1615 299 lb 11.2 oz (135.9 kg)     Height 01/30/21 1615 5\' 7"  (1.702 m)     Head Circumference --      [facility] --      Pain Score 01/30/21 1615 5     Pain Loc --      Pain Edu? --      Excl. in [place]? --     Most recent vital signs: Vitals:   01/30/21 1616  BP: 132/67  Pulse: (!) 116  Resp: 20  Temp: (!) 101.5 F (38.6 C)  SpO2: 95%    General: Awake, no distress.  CV:  Good peripheral perfusion.  Regular rate and rhythm around 100 bpm. Resp:  Normal effort.  Equal breath sounds bilaterally.   Abd:  No distention.  Soft, nontender.  No rebound or guarding.  Obese.  Colostomy present.    ED Results / Procedures / Treatments   RADIOLOGY  I have personally reviewed the chest x-ray images no significant findings on my evaluation. Radiology has read the chest x-ray is central venous congestion otherwise negative   MEDICATIONS ORDERED IN ED: Medications  lactated ringers infusion (has no administration in time range)  ceFEPIme (MAXIPIME) 2 g in sodium chloride 0.9 % 100 mL IVPB (2 g Intravenous New Bag/Given 01/30/21 1827)  metroNIDAZOLE (FLAGYL) IVPB 500 mg (has no administration in time range)  vancomycin (VANCOREADY) IVPB 2000 mg/400 mL (has no administration in time range)  ibuprofen (ADVIL) tablet 600 mg (600 mg Oral Given 01/30/21 1624)  sodium chloride 0.9 % bolus 1,000 mL (1,000 mLs Intravenous New Bag/Given 01/30/21 1823)     IMPRESSION / MDM / ASSESSMENT AND PLAN / ED COURSE  I reviewed the triage vital signs and the nursing notes.  Patient presents emergency department for fever chills found to be tachycardic.  Lab work has begun to result showing mild leukocytosis of 12,500 elevated lactate  of 2.4.  Based on the patient's lab work and vital signs patient meets sepsis criteria.  We will start the patient on broad-spectrum antibiotics.  In reviewing the patient's MAR it appears that the patient is currently taking IV Zosyn every 8 hours with a stop date of 02/23/2021.  We will check labs, chest x-ray does not appear to show any pneumonia.  COVID/flu is negative.  Given the patient's fever with her history of perforated diverticulitis recently we will obtain CT imaging of the abdomen to further evaluate.  Patient agreeable to plan of care.  As the patient meets sepsis criteria we will continue broad-spectrum antibiotics patient will ultimately be admitted to the hospital service once her emergency department work-up is been completed.  Patient and sons are agreeable to plan of  care.    CRITICAL CARE Performed by: Harvest Dark   Total critical care time: 30 minutes  Critical care time was exclusive of separately billable procedures and treating other patients.  Critical care was necessary to treat or prevent imminent or life-threatening deterioration.  Critical care was time spent personally by me on the following activities: development of treatment plan with patient and/or surrogate as well as nursing, discussions with consultants, evaluation of patient's response to treatment, examination of patient, obtaining history from patient or surrogate, ordering and performing treatments and interventions, ordering and review of laboratory studies, ordering and review of radiographic studies, pulse oximetry and re-evaluation of patient's condition.  FINAL CLINICAL IMPRESSION(S) / ED DIAGNOSES   Sepsis   Note:  This document was prepared using Dragon voice recognition software and may include unintentional dictation errors.   Harvest Dark, MD 01/30/21 2328

## 2021-01-30 NOTE — H&P (Addendum)
History and Physical    Wendy Sanchez NIO:[id number] DOB: 28-Jul-1950 DOA: 01/30/2021  PCP: Crissie Figures, PA-C   Patient coming from: SNF  I have personally briefly reviewed patient's relevant medical records in [place]  Chief Complaint: fever  HPI: Wendy Sanchez is a 71 y.o. female with medical history significant for COPD , CHF, HTN, PE on Eliquis, giant cell arteritis on prednisone, colostomy 11/2020 s/p perforated sigmoid diverticulitis, hospitalized at [facility] from 12/13 to 01/15/2021 for septic shock secondary to intra-abdominal abscesses, not drainable by IR and discharged to SNF on IV Zosyn via PICC line (12/25-2/12/23) with plans for outpatient ID follow-up, repeat CAT scan 4 weeks from discharge to determine need for longer course, who presents to the ED from SNF after she developed shaking chills and fever up to 101.5.  Denies vomiting or abdominal pain  ED course: T-max 101.5, heart rate up to 124 with BP 98/57 and MAPs mid 60s to low 70s Blood work: WBC 12.5, lactic acid 2.4-2.7-2.1 Hemoglobin 9.7 Creatinine 1.47, up from baseline of 0.99 Urinalysis unremarkable COVID and flu negative  EKG, personally viewed and interpreted: Sinus tachycardia at 122 with nonspecific ST-T wave changes  Imaging: CT abdomen and pelvis with mild diverticulitis involving the mid to distal descending colon Other chronic findings.  Please refer to complete report Chest x-ray showing central venous congestion with low lung volumes  Patient started on sepsis fluids and broad-spectrum antibiotics for sepsis of unknown source.  Hospitalist consulted for admission.   Review of Systems: As per HPI otherwise all other systems on review of systems negative.   Assessment/Plan Active Problems:   Severe sepsis (HCC)   Acute diverticulitis   Colostomy in place [facility])   AKI (acute kidney injury) ([place])   S/P PICC central line placement   Morbid obesity with BMI of 45.0-49.9, adult (HCC)    Chronic, continuous use of opioids   Giant cell arteritis (HCC)   Current chronic use of systemic steroids   Chronic diastolic CHF (congestive heart failure) (HCC)   Compression fracture of T2 vertebra (HCC)   Severe sepsis while on Zosyn via PICC 12/26 Acute diverticulitis /possible bacteremia Chronic immunosuppression/chronic systemic steroid - Suspect intra-abdominal source, possible bacteremia - Sepsis fluids per orderset - Continue broad-spectrum antibiotics, cefepime, vancomycin and Flagyl - Consider removal of PICC line placed on 12/26 with catheter tip cultures - Consider ID consult for recommendations - Surgery consulted  AKI - Secondary to sepsis.  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 - Colostomy care  Diastolic CHF with last known EF 50% - EF 55 to 60% on 12/26/2020 - Monitor for fluid overload with sepsis fluid bolus - Hold Entresto and torsemide due to soft blood pressures  COPD -DuoNeb as needed   Hx of Pulmonary Embolism Chronic anticoagulation -Patient has has 2 unprovoked PE's in past - Continue Eliquis  History of Temporal Arteritis Chronic systemic steroid --On prednisone 20 mg daily, pending med rec vs Iv hydrocortisone - Patient was started on PJP prophyaxis with Bactrim during hospitalization at [facility]  in setting of long term steroid use.    T 12 compression fracture with chronic pain - Patient had neurosurgical evaluation for candidacy of vertebral augmentation while at [facility]. Neurosurgery recommending conservative management, pain control, and outpatient steroid injections, with no indication for acute  neurosurgical intervention   Class III obesity - Complicating factor to overall prognosis and care - Increase nursing assistance  DVT  prophylaxis: Eliquis Code Status: full code  Family Communication:  none  Disposition Plan: Back to previous home environment Consults called: none  Status:At the time of admission, it appears that the appropriate admission status for this patient is INPATIENT. This is judged to be reasonable and necessary in order to provide the required intensity of service to ensure the patient's safety given the presenting symptoms, physical exam findings, and initial radiographic and laboratory data in the context of their  Comorbid conditions.   Patient requires inpatient status due to high intensity of service, high risk for further deterioration and high frequency of surveillance required.   I certify that at the point of admission it is my clinical judgment that the patient will require inpatient hospital care spanning beyond 2 midnights     Physical Exam: Vitals:   01/30/21 2115 01/30/21 2130 01/30/21 2200 01/30/21 2230  BP: 100/60 (!) 102/58 (!) 103/50 (!) 98/57  Pulse: (!) 109 (!) 107 (!) 102 (!) 102  Resp: 17 14 13 16   Temp:      TempSrc:      SpO2: 93% 95% 95% 95%  Weight:      Height:       Constitutional: Ill-appearing, oriented x 3 . Not in any apparent distress HEENT:      Head: Normocephalic and atraumatic.         Eyes: PERLA, EOMI, Conjunctivae are normal. Sclera is non-icteric.       Mouth/Throat: Mucous membranes are moist.       Neck: Supple with no signs of meningismus. Cardiovascular: Regular rate and rhythm. No murmurs, gallops, or rubs. 2+ symmetrical distal pulses are present . No JVD. No  LE edema.  PICC line right upper arm no surrounding erythema or rash or warmth Respiratory: Respiratory effort normal .Lungs sounds clear bilaterally. No wheezes, crackles, or rhonchi.  Gastrointestinal: Soft, non tender, non distended. Positive bowel sounds.  Colostomy Genitourinary: No CVA tenderness. Musculoskeletal: Nontender with normal range of motion in all extremities. No  cyanosis, or erythema of extremities. Neurologic:  Face is symmetric. Moving all extremities. No gross focal neurologic deficits . Skin: Skin is warm, dry.  No rash or ulcers Psychiatric: Mood and affect are appropriate     Past Medical History:  Diagnosis Date   Allergic rhinitis    Arthritis    Asthma    problems with fumes and aerosols cause asthma   Chest pain 10/19/2016   CHF (congestive heart failure) (HCC)    Complication of anesthesia    awakens during surgery; has occurred with last 3-4 surgeries    COPD (chronic obstructive pulmonary disease) ([place])    DVT (deep venous thrombosis) (HCC) 07/13/2018   Right leg   Environmental allergies    fumes    Fibromyalgia    GERD (gastroesophageal reflux disease)    Headache    Hemorrhoids    History of bronchitis    Hx of total knee arthroplasty 12/13/2015   Hyperlipidemia    Hypertension    Morbid obesity with BMI of 45.0-49.9, adult ([place]) 07/25/2015   NICM (nonischemic cardiomyopathy) ([place])    a. ? PVC mediated;  b. 06/2016 Echo: EF 25-30%, mild LVH, mild LAE, mild AI/MR/TR/PR; c. 08/2016 Cath: nl cors, EF 25%.   Numbness    hands bilat when driving; improves when not preforming task    Osteoarthritis of  left knee 08/02/2015   PE (pulmonary thromboembolism) ([place]) 07/13/2018   Pes anserinus bursitis of left knee 05/18/2016   Presence of right artificial knee joint 05/18/2016   PVC (premature ventricular contraction) 06/04/2017   PVC's (premature ventricular contractions)    a. 11/2016 [place] started;  b. 11/29/16 48h Holter: 25427 PVC's (41%); c. 12/2016 24h Holter: 18040 PVC's (15%); c. 02/2017 48h Holter: 37262 PVC's (41%).   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   Temporal arteritis ([place]) 05/01/2020   Tinnitus    comes and goes    Unilateral primary osteoarthritis, right knee 12/13/2015   Vertigo    none  for over 2 yrs    Past Surgical History:  Procedure Laterality Date   ABDOMINAL HYSTERECTOMY  1979   left ovary remains   ABDOMINAL HYSTERECTOMY     ARTERY BIOPSY Left 06/07/2019   Procedure: BIOPSY TEMPORAL ARTERY;  Surgeon: Algernon Huxley, MD;  Location: [facility] ORS;  Service: Vascular;  Laterality: Left;   CHOLECYSTECTOMY     COLONOSCOPY     COLONOSCOPY N/A 10/30/2019   Procedure: COLONOSCOPY;  Surgeon: Lesly Rubenstein, MD;  Location: [facility] ENDOSCOPY;  Service: Endoscopy;  Laterality: N/A;   ESOPHAGOGASTRODUODENOSCOPY N/A 10/30/2019   Procedure: ESOPHAGOGASTRODUODENOSCOPY (EGD);  Surgeon: Lesly Rubenstein, MD;  Location: [facility] ENDOSCOPY;  Service: Endoscopy;  Laterality: N/A;   fibrous tissue removed from right shoulder and back of neck      2 years ago    [place] N/A 02/28/2019   Procedure: SALIVARY GLAND BIOPSY;  Surgeon: Carloyn Manner, MD;  Location: [facility] ORS;  Service: ENT;  Laterality: N/A;   FRACTURE SURGERY     left small finger   GANGLION CYST EXCISION     GASTRIC BYPASS  1980   states had bypass with banding and band left in -   GASTRIC BYPASS OPEN     JOINT REPLACEMENT Bilateral 2017   knees   KNEE SURGERY Bilateral    both knees  2001   NECK SURGERY N/A 1205/19   ganglion cyst removal, [place]    PULMONARY THROMBECTOMY Bilateral 07/14/2018   Procedure: PULMONARY THROMBECTOMY;  Surgeon: Algernon Huxley, MD;  Location: [facility];  Service: Cardiovascular;  Laterality: Bilateral;   PVC ABLATION N/A 06/04/2017   Procedure: PVC ABLATION;  Surgeon: Evans Lance, MD;  Location: [facility];  Service: Cardiovascular;  Laterality: N/A;   RIGHT/LEFT HEART CATH AND CORONARY ANGIOGRAPHY Bilateral 08/12/2016   Procedure: Right/Left Heart Cath and Coronary Angiography;  Surgeon: Yolonda Kida, MD;  Location: [place];  Service: Cardiovascular;  Laterality: Bilateral;   TONSILLECTOMY     age 54   TOTAL KNEE ARTHROPLASTY Left  08/02/2015   Procedure: LEFT TOTAL KNEE ARTHROPLASTY;  Surgeon: Mcarthur Rossetti, MD;  Location: [facility] ORS;  Service: Orthopedics;  Laterality: Left;   TOTAL KNEE ARTHROPLASTY Right 12/13/2015   Procedure: RIGHT TOTAL KNEE ARTHROPLASTY;  Surgeon: Mcarthur Rossetti, MD;  Location: [facility] ORS;  Service: Orthopedics;  Laterality: Right;     reports that she quit smoking about 5 months ago. Her smoking use included cigarettes. She has a 12.50 pack-year smoking history. Her smokeless tobacco use includes chew. She reports that she does not drink alcohol and does not use drugs.  Allergies  Allergen Reactions   Hydrocodone-Acetaminophen  Swelling    hands   Iodine Anaphylaxis and Swelling    Iv dye   Other Other (See Comments)    ALLERGY TO METAL - BLACKENS SKIN AND CAUSES A RASH    Tape Swelling and Other (See Comments)    Skin comes off.  Paper tape is ok tegaderm OK   Peanut Oil Other (See Comments)    ** Nuts cause runny nose   Shellfish Allergy Nausea And Vomiting and Swelling    Episode of GI infection after eating clam chowder. Still eats shrimp and other seafood    Family History  Problem Relation Age of Onset   Hypertension Father    Deep vein thrombosis Father    Dementia Mother    Diabetes Sister    Congestive Heart Failure Sister    Congestive Heart Failure Brother    Congestive Heart Failure Daughter    Congestive Heart Failure Son    Breast cancer Maternal Aunt        great aunt      Prior to Admission medications   Medication Sig Start Date End Date Taking? Authorizing Provider  levocetirizine (XYZAL) 5 MG tablet Take 5 mg by mouth every evening.   Yes [provider]  nortriptyline (PAMELOR) 50 MG capsule Take 50 mg by mouth at bedtime.   Yes [provider]  nystatin cream (MYCOSTATIN) Apply 1 application topically 2 (two) times daily. (Apply under skin folds)   Yes [provider]  oxyCODONE-acetaminophen (PERCOCET) 10-325 MG tablet  Take 1 tablet by mouth every 4 (four) hours as needed for pain. 01/25/21 02/24/21 Yes Molli Barrows, MD  pantoprazole (PROTONIX) 40 MG tablet Take 40 mg by mouth daily.   Yes [provider]  piperacillin-tazobactam (ZOSYN) 4.5 (4-0.5) g injection Inject 4.5 g into the vein every 8 (eight) hours. 01/10/21 02/24/21 Yes [provider]  potassium chloride SA (KLOR-CON M) 20 MEQ tablet Take 20 mEq by mouth 2 (two) times daily.   Yes [provider]  predniSONE (DELTASONE) 10 MG tablet Take 20 mg by mouth in the morning.   Yes [provider]  spironolactone (ALDACTONE) 25 MG tablet Take 25 mg by mouth daily. 09/18/20  Yes [provider]  sulfamethoxazole-trimethoprim (BACTRIM DS) 800-160 MG tablet Take 1 tablet by mouth every Monday, Wednesday, and Friday. 01/10/21  Yes [provider]  torsemide (DEMADEX) 20 MG tablet Take 2 tablets (40 mg) by mouth once daily Patient taking differently: Take 60 mg by mouth every Monday, Wednesday, and Friday. Take 2 tablets (40 mg) by mouth once daily 04/12/20  Yes Deboraha Sprang, MD  acetaminophen (TYLENOL) 325 MG tablet Take 2 tablets (650 mg total) by mouth every 6 (six) hours as needed for mild pain or fever. 11/17/20   Cristal Deer, MD  acidophilus (RISAQUAD) CAPS capsule Take 1 capsule by mouth in the morning. Patient not taking: Reported on 01/30/2021    [provider]  albuterol (PROVENTIL HFA;VENTOLIN HFA) 108 (90 Base) MCG/ACT inhaler Inhale 2 puffs into the lungs every 6 (six) hours as needed for wheezing or shortness of breath. Patient not taking: Reported on 01/30/2021    [provider]  alendronate (FOSAMAX) 35 MG tablet Take 35 mg by mouth every Saturday. Take with a full glass of water on an empty stomach. Patient not taking: Reported on 01/30/2021    [provider]  amoxicillin-clavulanate (AUGMENTIN) 875-125 MG tablet Take 1 tablet by mouth 2 (two) times daily. Patient  not taking: Reported on 01/30/2021 12/03/20   [provider]  apixaban (ELIQUIS) 5 MG TABS tablet Take 1 tablet (5 mg total) by mouth 2 (two) times daily. 07/22/18 11/25/20  Saundra Shelling, MD  ascorbic acid (VITAMIN C) 500 MG tablet Take 500 mg by mouth 2 (two) times daily. Patient not taking: Reported on 11/25/2020    [provider]  atorvastatin (LIPITOR) 40 MG tablet Take 40 mg by mouth daily. Patient not taking: Reported on 01/30/2021 11/26/20   [provider]  azelastine (ASTELIN) 0.1 % nasal spray Place 1 spray into both nostrils 2 (two) times daily. Patient not taking: Reported on 11/25/2020    [provider]  calcium carbonate (OS-CAL - DOSED IN MG OF ELEMENTAL CALCIUM) 1250 (500 Ca) MG tablet Take 2 tablets by mouth in the morning.    [provider]  diclofenac sodium (VOLTAREN) 1 % GEL Apply 2 g topically in the morning. (Apply to most painful area) Patient not taking: Reported on 11/25/2020    [provider]  diphenhydrAMINE (BENADRYL) 25 mg capsule Take 25 mg by mouth every 6 (six) hours as needed for itching or allergies.    [provider]  docusate sodium (COLACE) 100 MG capsule Take 200 mg by mouth at bedtime as needed for mild constipation.    [provider]  fluconazole (DIFLUCAN) 100 MG tablet Take 100 mg by mouth daily. Patient not taking: Reported on 01/30/2021 11/26/20   [provider]  folic acid (FOLVITE) 1 MG tablet Take 1 mg by mouth daily. Patient not taking: Reported on 01/30/2021    [provider]  gabapentin (NEURONTIN) 300 MG capsule Take 600 mg by mouth 2 (two) times daily as needed. Patient not taking: Reported on 01/30/2021 11/26/20   [provider]  gabapentin (NEURONTIN) 600 MG tablet Take 1 tablet (600 mg total) by mouth 3 (three) times daily. Patient not taking: Reported on 01/30/2021 01/15/21 02/14/21  Molli Barrows, MD  ibuprofen (ADVIL) 600 MG tablet Take 1  tablet (600 mg total) by mouth every 6 (six) hours as needed for moderate pain (severe pain). Patient not taking: Reported on 11/25/2020 11/17/20   Cristal Deer, MD  ipratropium-albuterol (DUONEB) 0.5-2.5 (3) MG/3ML SOLN Take 3 mLs by nebulization every 6 (six) hours as needed (shortness of breath).    [provider]  magnesium oxide (MAG-OX) 400 MG tablet Take 800 mg by mouth daily. Patient not taking: Reported on 11/25/2020    [provider]  metoprolol tartrate (LOPRESSOR) 25 MG tablet Take 0.5 tablets (12.5 mg total) by mouth daily. Patient not taking: Reported on 11/25/2020 11/18/20   Cristal Deer, MD  montelukast (SINGULAIR) 10 MG tablet Take 10 mg by mouth at bedtime. Patient not taking: Reported on 01/30/2021    [provider]  Multiple Vitamins-Minerals (MULTIVITAMIN WITH MINERALS) tablet Take 1 tablet by mouth daily. Patient not taking: Reported on 11/25/2020    [provider]  nystatin (MYCOSTATIN) 100000 UNIT/ML suspension Take 5 mLs by mouth 4 (four) times daily. 12/03/20   [provider]  oxyCODONE-acetaminophen (PERCOCET) 10-325 MG tablet Take 1 tablet by mouth every 4 (four) hours as needed for pain. 02/24/21 03/26/21  Molli Barrows, MD  polyethylene glycol (MIRALAX / GLYCOLAX) 17 g packet Take 17 g by mouth daily as needed for moderate constipation. 11/17/20   Cristal Deer, MD  sacubitril-valsartan (ENTRESTO) 24-26 MG Take 1 tablet by mouth 2 (two) times daily. Patient not taking: Reported on  11/25/2020    [provider]  sucralfate (CARAFATE) 1 G tablet Take 1 g by mouth 2 (two) times daily before a meal. Patient not taking: Reported on 01/30/2021    [provider]  umeclidinium-vilanterol (ANORO ELLIPTA) 62.5-25 MCG/INH AEPB Inhale 1 puff into the lungs daily. Patient not taking: Reported on 01/30/2021 12/23/17   [provider]  Vitamin D, Ergocalciferol, (DRISDOL) 50000 UNITS CAPS capsule Take  50,000 Units by mouth every Monday. Patient not taking: Reported on 01/30/2021    [provider]  Wound Cleansers (VASHE WOUND THERAPY) SOLN Apply 1 application topically daily. (Irrigate abdominal wound)    [provider]      Labs on Admission: I have personally reviewed following labs and imaging studies  CBC: Recent Labs  Lab 01/30/21 1619  WBC 12.5*  HGB 9.7*  HCT 32.6*  MCV 77.3*  PLT 248*   Basic Metabolic Panel: Recent Labs  Lab 01/30/21 1619  NA 137  K 3.5  CL 95*  CO2 30  GLUCOSE 107*  BUN 26*  CREATININE 1.47*  CALCIUM 9.1   GFR: Estimated Creatinine Clearance: 52.1 mL/min (A) (by C-G formula based on SCr of 1.47 mg/dL (H)). Liver Function Tests: No results for input(s): AST, ALT, ALKPHOS, BILITOT, PROT, ALBUMIN in the last 168 hours. No results for input(s): LIPASE, AMYLASE in the last 168 hours. No results for input(s): AMMONIA in the last 168 hours. Coagulation Profile: Recent Labs  Lab 01/30/21 1814  INR 1.3*   Cardiac Enzymes: No results for input(s): CKTOTAL, CKMB, CKMBINDEX, TROPONINI in the last 168 hours. BNP (last 3 results) No results for input(s): PROBNP in the last 8760 hours. HbA1C: No results for input(s): HGBA1C in the last 72 hours. CBG: No results for input(s): GLUCAP in the last 168 hours. Lipid Profile: No results for input(s): CHOL, HDL, LDLCALC, TRIG, CHOLHDL, LDLDIRECT in the last 72 hours. Thyroid Function Tests: No results for input(s): TSH, T4TOTAL, FREET4, T3FREE, THYROIDAB in the last 72 hours. Anemia Panel: No results for input(s): VITAMINB12, FOLATE, FERRITIN, TIBC, IRON, RETICCTPCT in the last 72 hours. Urine analysis:    Component Value Date/Time   COLORURINE YELLOW (A) 01/30/2021 1831   APPEARANCEUR CLEAR (A) 01/30/2021 1831   LABSPEC 1.011 01/30/2021 1831   PHURINE 5.0 01/30/2021 1831   GLUCOSEU NEGATIVE 01/30/2021 1831   HGBUR NEGATIVE 01/30/2021 1831   BILIRUBINUR NEGATIVE 01/30/2021  1831   KETONESUR NEGATIVE 01/30/2021 1831   PROTEINUR NEGATIVE 01/30/2021 1831   NITRITE NEGATIVE 01/30/2021 1831   LEUKOCYTESUR NEGATIVE 01/30/2021 1831    Radiological Exams on Admission: CT ABDOMEN PELVIS WO CONTRAST  Result Date: 01/30/2021 CLINICAL DATA:  Shivering and headache. On antibiotics for diverticulitis. EXAM: CT ABDOMEN AND PELVIS WITHOUT CONTRAST TECHNIQUE: Multidetector CT imaging of the abdomen and pelvis was performed following the standard protocol without IV contrast. RADIATION DOSE REDUCTION: This exam was performed according to the departmental dose-optimization program which includes automated exposure control, adjustment of the mA and/or kV according to patient size and/or use of iterative reconstruction technique. COMPARISON:  July 31, 2013 FINDINGS: Lower chest: Mild atelectasis is seen within the posterior aspects of the bilateral lung bases. Hepatobiliary: Punctate calcified granulomas are seen scattered throughout the liver parenchyma. Status post cholecystectomy. No biliary dilatation. Pancreas: Unremarkable. No pancreatic ductal dilatation or surrounding inflammatory changes. Spleen: Normal in size without focal abnormality. Adrenals/Urinary Tract: Adrenal glands are unremarkable. Kidneys are normal in size, without renal calculi or hydronephrosis. 5 mm and 10 mm hyperdense  foci are seen within the right kidney. A similar appearing 7 mm hyperdense focus is noted within the posterolateral aspect of the mid to lower left kidney. Bladder is unremarkable. Stomach/Bowel: There is a very small hiatal hernia. Surgical sutures are noted within the gastric region. Appendix appears normal. No evidence of bowel dilatation. Mildly inflamed diverticula are seen within the mid to distal descending colon. There is no evidence of associated perforation or abscess. Noninflamed diverticula are seen throughout the remainder of the large bowel. Vascular/Lymphatic: Aortic atherosclerosis. No  enlarged abdominal or pelvic lymph nodes. Reproductive: Status post hysterectomy. No adnexal masses. Other: A left lower quadrant ostomy site is present. No abdominopelvic ascites. Musculoskeletal: Multilevel degenerative changes are seen throughout the lumbar spine with a chronic fracture deformity seen along the superior endplate of the R00 vertebral body. IMPRESSION: 1. Mild diverticulitis involving the mid to distal descending colon. 2. Evidence of prior gastric surgery and left lower quadrant ostomy site. 3. Small hiatal hernia. 4. Findings likely consistent with prior cholecystectomy and hysterectomy. 5. Chronic fracture deformity along the superior endplate of the T62 vertebral body. 6. Aortic atherosclerosis. Aortic Atherosclerosis (ICD10-I70.0). Electronically Signed   By: Virgina Norfolk M.D.   On: 01/30/2021 22:08   DG Chest 2 View  Result Date: 01/30/2021 CLINICAL DATA:  Best images obtainable due to patients condition. [facility] for chills and generalized bodyaches. Pt alert and oriented. NAD noted Pt currently on antibiotics for "infection"infection EXAM: CHEST - 2 VIEW COMPARISON:  None. FINDINGS: RIGHT PICC line tip in distal SVC. Stable large cardiac silhouette. Mild central venous congestion. No focal infiltrate. No pneumothorax. IMPRESSION: Central venous congestion.  Low lung volumes. Electronically Signed   By: Suzy Bouchard M.D.   On: 01/30/2021 18:22       Athena Masse MD [facility]   01/30/2021, 11:07 PM

## 2021-01-30 NOTE — Progress Notes (Signed)
Notified provider of need to order repeat lactic acid.  Second Lactic acid went up to 2.7

## 2021-01-30 NOTE — Consult Note (Signed)
PHARMACY -  BRIEF ANTIBIOTIC NOTE   Pharmacy has received consult(s) for vancomycin from an ED provider.  The patient's profile has been reviewed for ht/wt/allergies/indication/available labs.    One time order(s) placed for Vancomycin 2gm IV x1  Further antibiotics/pharmacy consults should be ordered by admitting physician if indicated.                       Thank you, Darrick Penna 01/30/2021  6:19 PM

## 2021-01-31 ENCOUNTER — Encounter: Payer: Self-pay | Admitting: Internal Medicine

## 2021-01-31 DIAGNOSIS — R6520 Severe sepsis without septic shock: Secondary | ICD-10-CM | POA: Diagnosis not present

## 2021-01-31 DIAGNOSIS — N179 Acute kidney failure, unspecified: Secondary | ICD-10-CM

## 2021-01-31 DIAGNOSIS — K5732 Diverticulitis of large intestine without perforation or abscess without bleeding: Secondary | ICD-10-CM

## 2021-01-31 DIAGNOSIS — I5032 Chronic diastolic (congestive) heart failure: Secondary | ICD-10-CM

## 2021-01-31 DIAGNOSIS — I5030 Unspecified diastolic (congestive) heart failure: Secondary | ICD-10-CM

## 2021-01-31 DIAGNOSIS — A419 Sepsis, unspecified organism: Principal | ICD-10-CM

## 2021-01-31 DIAGNOSIS — K5792 Diverticulitis of intestine, part unspecified, without perforation or abscess without bleeding: Secondary | ICD-10-CM | POA: Diagnosis not present

## 2021-01-31 DIAGNOSIS — R6510 Systemic inflammatory response syndrome (SIRS) of non-infectious origin without acute organ dysfunction: Secondary | ICD-10-CM | POA: Diagnosis not present

## 2021-01-31 DIAGNOSIS — Z86711 Personal history of pulmonary embolism: Secondary | ICD-10-CM

## 2021-01-31 LAB — COMPREHENSIVE METABOLIC PANEL
ALT: 16 U/L (ref 0–44)
AST: 20 U/L (ref 15–41)
Albumin: 1.7 g/dL — ABNORMAL LOW (ref 3.5–5.0)
Alkaline Phosphatase: 65 U/L (ref 38–126)
Anion gap: 9 (ref 5–15)
BUN: 19 mg/dL (ref 8–23)
CO2: 23 mmol/L (ref 22–32)
Calcium: 7.8 mg/dL — ABNORMAL LOW (ref 8.9–10.3)
Chloride: 103 mmol/L (ref 98–111)
Creatinine, Ser: 0.86 mg/dL (ref 0.44–1.00)
GFR, Estimated: >60 mL/min (ref >60)
Glucose, Bld: 80 mg/dL (ref 70–99)
Potassium: 3.4 mmol/L — ABNORMAL LOW (ref 3.5–5.1)
Sodium: 135 mmol/L (ref 135–145)
Total Bilirubin: 0.5 mg/dL (ref 0.3–1.2)
Total Protein: 5.2 g/dL — ABNORMAL LOW (ref 6.5–8.1)

## 2021-01-31 LAB — RESPIRATORY PANEL BY PCR

## 2021-01-31 LAB — PROTIME-INR
INR: 1.2 (ref 0.8–1.2)
Prothrombin Time: 14.8 seconds (ref 11.4–15.2)

## 2021-01-31 LAB — CBC
HCT: 30.6 % — ABNORMAL LOW (ref 36.0–46.0)
Hemoglobin: 9.1 g/dL — ABNORMAL LOW (ref 12.0–15.0)
MCH: 22.9 pg — ABNORMAL LOW (ref 26.0–34.0)
MCHC: 29.7 g/dL — ABNORMAL LOW (ref 30.0–36.0)
MCV: 77.1 fL — ABNORMAL LOW (ref 80.0–100.0)
Platelets: 417 10*3/uL — ABNORMAL HIGH (ref 150–400)
RBC: 3.97 MIL/uL (ref 3.87–5.11)
RDW: 19.6 % — ABNORMAL HIGH (ref 11.5–15.5)
WBC: 15.9 10*3/uL — ABNORMAL HIGH (ref 4.0–10.5)
nRBC: 0 % (ref 0.0–0.2)

## 2021-01-31 LAB — CORTISOL-AM, BLOOD: Cortisol - AM: 7.9 ug/dL (ref 6.7–22.6)

## 2021-01-31 LAB — PROCALCITONIN: Procalcitonin: 0.17 ng/mL

## 2021-01-31 MED ORDER — OXYCODONE-ACETAMINOPHEN 5-325 MG PO TABS
1.0000 | ORAL_TABLET | ORAL | Status: DC | PRN
  Administered 2021-01-31 – 2021-02-01 (×4): 1 via ORAL
  Filled 2021-01-31 (×4): qty 1

## 2021-01-31 MED ORDER — OXYCODONE HCL 5 MG PO TABS
5.0000 mg | ORAL_TABLET | ORAL | Status: DC | PRN
  Administered 2021-01-31: 5 mg via ORAL
  Filled 2021-01-31: qty 1

## 2021-01-31 MED ORDER — MEROPENEM 1 G IV SOLR
1.0000 g | Freq: Three times a day (TID) | INTRAVENOUS | Status: AC
  Administered 2021-02-01 – 2021-02-04 (×12): 1 g via INTRAVENOUS
  Filled 2021-01-31 (×14): qty 1

## 2021-01-31 MED ORDER — CHLORHEXIDINE GLUCONATE CLOTH 2 % EX PADS
6.0000 | MEDICATED_PAD | Freq: Every day | CUTANEOUS | Status: DC
  Administered 2021-02-01 – 2021-02-03 (×2): 6 via TOPICAL

## 2021-01-31 MED ORDER — ALBUMIN HUMAN 5 % IV SOLN
25.0000 g | Freq: Once | INTRAVENOUS | Status: AC
  Administered 2021-01-31: 25 g via INTRAVENOUS
  Filled 2021-01-31: qty 500

## 2021-01-31 MED ORDER — HYDROCORTISONE SOD SUC (PF) 100 MG IJ SOLR
100.0000 mg | Freq: Three times a day (TID) | INTRAMUSCULAR | Status: DC
  Administered 2021-01-31 – 2021-02-01 (×3): 100 mg via INTRAVENOUS
  Filled 2021-01-31 (×3): qty 2

## 2021-01-31 MED ORDER — VANCOMYCIN HCL 2000 MG/400ML IV SOLN
2000.0000 mg | INTRAVENOUS | Status: DC
  Administered 2021-01-31 – 2021-02-02 (×3): 2000 mg via INTRAVENOUS
  Filled 2021-01-31 (×4): qty 400

## 2021-01-31 MED ORDER — SODIUM CHLORIDE 0.9 % IV SOLN
2.0000 g | Freq: Three times a day (TID) | INTRAVENOUS | Status: AC
  Administered 2021-01-31 (×3): 2 g via INTRAVENOUS
  Filled 2021-01-31 (×6): qty 2

## 2021-01-31 NOTE — Assessment & Plan Note (Addendum)
PICC line removed as potential source. CT abd not obvious for focal infection. Seen by surgery and ID. Abx switched to Meropenem and vanco per ID  Appreciate ICU team input and mgmt - added stress dose steroid. Normal cortisol

## 2021-01-31 NOTE — Hospital Course (Addendum)
46 y f with COPD , CHF, HTN, PE on Eliquis, giant cell arteritis on prednisone, colostomy 11/2020 s/p perforated sigmoid diverticulitis, hospitalized at [facility] from 12/13 to 01/15/2021 for septic shock secondary to intra-abdominal abscesses, not drainable by IR and discharged to SNF on IV Zosyn via PICC line (12/25-2/12/23) with plans for outpatient ID follow-up, repeat CAT scan 4 weeks from discharge to determine need for longer course, who presents to the ED from SNF after she developed shaking chills and fever up to 101.5.  1/20 - ID, Intensivist & surgery seen. Removed PICC line as potential source of infection. Started Meropenem and Vanco per ID. Stress dose steroids per ICU team. Not surgical abd and likely not the source of infection per surgery 1/21: Moved out to floor.  Tolerating diet.  Blood pressure stable 1/22: Increased prednisone to 20 mg as per home dose.  Also restarted Percocet and gabapentin per her home dose.   1/23 - Remains afebrile and blood pressure controlled. 1/24-  CT AP not showing any acute pathology.  ID recommends 1 more days of IV meropenem after which she can be discharged home.  Insurance did not approve SNF/CIR

## 2021-01-31 NOTE — Consult Note (Signed)
NAME: Wendy Sanchez  DOB: 06/06/1950  MRN: [id number]  Date/Time: 01/31/2021 5:12 PM  REQUESTING PROVIDER: Dr.Shah Subjective:  REASON FOR CONSULT: sepsis ? Wendy Sanchez is a 71 y.o. female with a history of HFpEF, GCA on chronic steorids, renal cell carcinoma left, PE, gastric bypass surgery, recent perforated diverticulitis s/p colectomy , unclear sepsis following that and is on zosyn IV and followed at [facility] , Giant cell arteritis on prednisone Presented to the ED on [facility] with headache chills and rigors In the ED Temp 101.5, BP 99/60, Pulse 104, RR 13. WBC 12.5, HB 9.7, PLT 440, cr 1,47, lactate 2.3>2.7 Resp virus neg Blood culture sent CT abdomen showed There is a very small hiatal hernia. Surgical sutures are noted within the gastric region. Appendix appears normal. No evidence of bowel dilatation. Mildly inflamed diverticula are seen within the mid to distal descending colon. There is no evidence of associated perforation or abscess. Noninflamed diverticula are seen throughout the remainder of the large bowel. Was started on vanco/cefepime and flagyl and I am asked to see her  for the same  Pt has complicated medical history 09/23/20-10/23/20 at [facility] for perforated sigmoid diverticulitis  pneumoperitoneum and was perforated diverticulitis and underwent partial colectomy with end colostomy and closure of distal segment ( hartmann procedure) on 05/29/59, course complictaed by AKI, drop in HB from 7-5.9, Repeat CT abdomen showed heamtoma formation, no IR intervention was discharged to SNF on 10/23/21. Readmitted on 12/13-01/15/21 for fever and altered mental status, CT abdomen showed some stranding at the midline abdominal site- started cipro and flagyl but with subsequent worsening of pain and repeat CT showing significantly increased stranding with soft tissue gas near ostomy site, concerning for worsening infection or bowel perforation, with slight decreased size of left  pelvic wall fluid collection. NO IR intervention was possible . Seen by ID  Antibiotic coverage broadened to zosyn (on 12/25) with plan to continue for minimum 4 weeks with repeat CT scan in about 1 month and outpatient ID follow up 2/10 to determine need for longer course. PICC line was placed 12/26 in right arm and was discharge to [facility] 01/15/21. Abx to end 02/23/21.  Past Medical History:  Diagnosis Date   Allergic rhinitis    Arthritis    Asthma    problems with fumes and aerosols cause asthma   Chest pain 10/19/2016   CHF (congestive heart failure) (HCC)    Complication of anesthesia    awakens during surgery; has occurred with last 3-4 surgeries    COPD (chronic obstructive pulmonary disease) ([place])    DVT (deep venous thrombosis) (HCC) 07/13/2018   Right leg   Environmental allergies    fumes    Fibromyalgia    GERD (gastroesophageal reflux disease)    Headache    Hemorrhoids    History of bronchitis    Hx of total knee arthroplasty 12/13/2015   Hyperlipidemia    Hypertension    Morbid obesity with BMI of 45.0-49.9, adult ([place]) 07/25/2015   NICM (nonischemic cardiomyopathy) ([place])    a. ? PVC mediated;  b. 06/2016 Echo: EF 25-30%, mild LVH, mild LAE, mild AI/MR/TR/PR; c. 08/2016 Cath: nl cors, EF 25%.   Numbness    hands bilat when driving; improves when not preforming task    Osteoarthritis of left knee 08/02/2015   PE (pulmonary thromboembolism) ([place]) 07/13/2018   Pes anserinus bursitis of left knee 05/18/2016   Presence of right artificial knee joint 05/18/2016  PVC (premature ventricular contraction) 06/04/2017   PVC's (premature ventricular contractions)    a. 11/2016 Amio started;  b. 11/29/16 48h Holter: 34196 PVC's (41%); c. 12/2016 24h Holter: 18040 PVC's (15%); c. 02/2017 48h Holter: 37262 PVC's (41%).   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    Temporal arteritis ([place]) 05/01/2020   Tinnitus    comes and goes    Unilateral primary osteoarthritis, right knee 12/13/2015   Vertigo    none for over 2 yrs    Past Surgical History:  Procedure Laterality Date   ABDOMINAL HYSTERECTOMY  1979   left ovary remains   ABDOMINAL HYSTERECTOMY     ARTERY BIOPSY Left 06/07/2019   Procedure: BIOPSY TEMPORAL ARTERY;  Surgeon: Algernon Huxley, MD;  Location: [facility] ORS;  Service: Vascular;  Laterality: Left;   CHOLECYSTECTOMY     COLONOSCOPY     COLONOSCOPY N/A 10/30/2019   Procedure: COLONOSCOPY;  Surgeon: Lesly Rubenstein, MD;  Location: [facility] ENDOSCOPY;  Service: Endoscopy;  Laterality: N/A;   ESOPHAGOGASTRODUODENOSCOPY N/A 10/30/2019   Procedure: ESOPHAGOGASTRODUODENOSCOPY (EGD);  Surgeon: Lesly Rubenstein, MD;  Location: [facility] ENDOSCOPY;  Service: Endoscopy;  Laterality: N/A;   fibrous tissue removed from right shoulder and back of neck      2 years ago    [facility] N/A 02/28/2019   Procedure: SALIVARY GLAND BIOPSY;  Surgeon: Carloyn Manner, MD;  Location: [facility] ORS;  Service: ENT;  Laterality: N/A;   FRACTURE SURGERY     left small finger   GANGLION CYST EXCISION     GASTRIC BYPASS  1980   states had bypass with banding and band left in -   GASTRIC BYPASS OPEN     JOINT REPLACEMENT Bilateral 2017   knees   KNEE SURGERY Bilateral    both knees  2001   NECK SURGERY N/A 1205/19   ganglion cyst removal, benigh    PULMONARY THROMBECTOMY Bilateral 07/14/2018   Procedure: PULMONARY THROMBECTOMY;  Surgeon: Algernon Huxley, MD;  Location: [place] [facility];  Service: Cardiovascular;  Laterality: Bilateral;   PVC ABLATION N/A 06/04/2017   Procedure: PVC ABLATION;  Surgeon: Evans Lance, MD;  Location: [place] [facility];  Service: Cardiovascular;  Laterality: N/A;   RIGHT/LEFT HEART CATH AND CORONARY ANGIOGRAPHY Bilateral 08/12/2016   Procedure: Right/Left Heart Cath and Coronary Angiography;  Surgeon: Yolonda Kida, MD;   Location: [place] [facility];  Service: Cardiovascular;  Laterality: Bilateral;   TONSILLECTOMY     age 82   TOTAL KNEE ARTHROPLASTY Left 08/02/2015   Procedure: LEFT TOTAL KNEE ARTHROPLASTY;  Surgeon: Mcarthur Rossetti, MD;  Location: [facility] ORS;  Service: Orthopedics;  Laterality: Left;   TOTAL KNEE ARTHROPLASTY Right 12/13/2015   Procedure: RIGHT TOTAL KNEE ARTHROPLASTY;  Surgeon: Mcarthur Rossetti, MD;  Location: [facility] OR[facility];  Service: Orthopedics;  Laterality: Right;    Social History   Socioeconomic History   Marital status: Single    Spouse name: Not on file   Number of children: Not on file   Years of education: Not on file   Highest education level: Not on file  Occupational History   Not on file  Tobacco Use   Smoking status: Former    Packs/day: 0.25    Years: 50.00    Pack years: 12.50    Types:  Cigarettes    Quit date: 08/06/2020    Years since quitting: 0.4   Smokeless tobacco: Current    Types: Chew  Vaping Use   Vaping Use: Never used  Substance and Sexual Activity   Alcohol use: No    Alcohol/week: 0.0 standard drinks   Drug use: No   Sexual activity: Not on file  Other Topics Concern   Not on file  Social History Narrative   Not on file   Social Determinants of Health   Financial Resource Strain: Not on file  Food Insecurity: Not on file  Transportation Needs: Not on file  Physical Activity: Not on file  Stress: Not on file  Social Connections: Not on file  Intimate Partner Violence: Not on file    Family History  Problem Relation Age of Onset   Hypertension Father    Deep vein thrombosis Father    Dementia Mother    Diabetes Sister    Congestive Heart Failure Sister    Congestive Heart Failure Brother    Congestive Heart Failure Daughter    Congestive Heart Failure Son    Breast cancer Maternal Aunt        great aunt   Allergies  Allergen Reactions   Hydrocodone-Acetaminophen Swelling    hands   Iodine Anaphylaxis and Swelling     Iv dye   Other Other (See Comments)    ALLERGY TO METAL - BLACKENS SKIN AND CAUSES A RASH    Tape Swelling and Other (See Comments)    Skin comes off.  Paper tape is ok tegaderm OK   Peanut Oil Other (See Comments)    ** Nuts cause runny nose   Shellfish Allergy Nausea And Vomiting and Swelling    Episode of GI infection after eating clam chowder. Still eats shrimp and other seafood   I? Current Facility-Administered Medications  Medication Dose Route Frequency Provider Last Rate Last Admin   acetaminophen (TYLENOL) tablet 650 mg  650 mg Oral Q6H PRN Athena Masse, MD   650 mg at 01/31/21 1649   Or   acetaminophen (TYLENOL) suppository 650 mg  650 mg Rectal Q6H PRN Athena Masse, MD       apixaban Arne Cleveland) tablet 5 mg  5 mg Oral BID Judd Gaudier V, MD   5 mg at 01/31/21 0920   ceFEPIme (MAXIPIME) 2 g in sodium chloride 0.9 % 100 mL IVPB  2 g Intravenous Q8H Athena Masse, MD   Stopped at 01/31/21 0953   [START ON 02/01/2021] Chlorhexidine Gluconate Cloth 2 % PADS 6 each  6 each Topical Q0600 Nelle Don, MD       hydrocortisone sodium succinate (SOLU-CORTEF) 100 MG injection 100 mg  100 mg Intravenous Q8H Darel Hong D, NP   100 mg at 01/31/21 1501   ipratropium-albuterol (DUONEB) 0.5-2.5 (3) MG/3ML nebulizer solution 3 mL  3 mL Nebulization Q6H PRN Athena Masse, MD       metroNIDAZOLE (FLAGYL) IVPB 500 mg  500 mg Intravenous Q12H Athena Masse, MD   Stopped at 01/31/21 0856   ondansetron (ZOFRAN) tablet 4 mg  4 mg Oral Q6H PRN Athena Masse, MD       Or   ondansetron [facility]) injection 4 mg  4 mg Intravenous Q6H PRN Athena Masse, MD       oxyCODONE-acetaminophen (PERCOCET/ROXICET) 5-325 MG per tablet 1 tablet  1 tablet Oral Q4H PRN Athena Masse, MD   1 tablet at 01/31/21  1557   And   oxyCODONE (Oxy IR/ROXICODONE) immediate release tablet 5 mg  5 mg Oral Q4H PRN Athena Masse, MD   5 mg at 01/31/21 1121     Abtx:  Anti-infectives (From admission, onward)     Start     Dose/Rate Route Frequency Ordered Stop   01/31/21 0230  ceFEPIme (MAXIPIME) 2 g in sodium chloride 0.9 % 100 mL IVPB        2 g 200 mL/hr over 30 Minutes Intravenous Every 8 hours 01/31/21 0012     01/30/21 2015  vancomycin (VANCOCIN) IVPB 1000 mg/200 mL premix  Status:  Discontinued       See Hyperspace for full Linked Orders Report.   1,000 mg 200 mL/hr over 60 Minutes Intravenous  Once 01/30/21 1812 01/30/21 1813   01/30/21 1815  vancomycin (VANCOREADY) IVPB 2000 mg/400 mL       See Hyperspace for full Linked Orders Report.   2,000 mg 200 mL/hr over 120 Minutes Intravenous  Once 01/30/21 1812 01/31/21 0404   01/30/21 1800  ceFEPIme (MAXIPIME) 2 g in sodium chloride 0.9 % 100 mL IVPB        2 g 200 mL/hr over 30 Minutes Intravenous  Once 01/30/21 1752 01/30/21 1851   01/30/21 1800  metroNIDAZOLE (FLAGYL) IVPB 500 mg        500 mg 100 mL/hr over 60 Minutes Intravenous  Once 01/30/21 1752 01/30/21 2111   01/30/21 1800  vancomycin (VANCOCIN) IVPB 1000 mg/200 mL premix  Status:  Discontinued        1,000 mg 200 mL/hr over 60 Minutes Intravenous  Once 01/30/21 1752 01/30/21 1810   01/30/21 0700  metroNIDAZOLE (FLAGYL) IVPB 500 mg        500 mg 100 mL/hr over 60 Minutes Intravenous Every 12 hours 01/30/21 2329 02/06/21 0659       REVIEW OF SYSTEMS:  Const:  fever,  chills, negative weight loss Eyes: negative diplopia or visual changes, negative eye pain ENT: negative coryza, negative sore throat Resp: negative cough, hemoptysis, dyspnea Cards: negative for chest pain, palpitations, lower extremity edema GU: negative for frequency, dysuria and hematuria GI: has colostomy Skin: negative for rash and pruritus Heme: negative for easy bruising and gum/nose bleeding MS: generalized weakness Neurolo:has  headaches,  No dizziness, vertigo, memory problems  Psych: negative for feelings of anxiety, depression  Endocrine: negative for thyroid, diabetes Allergy/Immunology-as  above Objective:  VITALS:  BP 105/68    Pulse 88    Temp 97.8 F (36.6 C) (Oral)    Resp 16    Ht 5\' 7"  (1.702 m)    Wt 135.9 kg    SpO2 99%    BMI 46.94 kg/m  PHYSICAL EXAM:  General: Alert, cooperative, no distress, appears stated age.  Head: Normocephalic, without obvious abnormality, atraumatic. Eyes: Conjunctivae clear, anicteric sclerae. Pupils are equal ENT Nares normal. No drainage or sinus tenderness. Lips, mucosa, and tongue normal. No Thrush Neck: Supple, symmetrical, no adenopathy, thyroid: non tender no carotid bruit and no JVD. Back: No CVA tenderness. Lungs:b/l air entry Heart: Regular rate and rhythm, no murmur, rub or gallop. Abdomen: Soft, colosotmy Extremities: b/l edema legs rt> left B/l knee scars Skin: No rashes or lesions. Or bruising Lymph: Cervical, supraclavicular normal. Neurologic: Grossly non-focal Pertinent Labs Lab Results CBC    Component Value Date/Time   WBC 15.9 (H) 01/31/2021 0607   RBC 3.97 01/31/2021 0607   HGB 9.1 (L) 01/31/2021 0607   HGB  11.7 04/11/2020 1155   HCT 30.6 (L) 01/31/2021 0607   HCT 36.1 04/11/2020 1155   PLT 417 (H) 01/31/2021 0607   PLT 339 04/11/2020 1155   MCV 77.1 (L) 01/31/2021 0607   MCV 82 04/11/2020 1155   MCH 22.9 (L) 01/31/2021 0607   MCHC 29.7 (L) 01/31/2021 0607   RDW 19.6 (H) 01/31/2021 0607   RDW 14.8 04/11/2020 1155   LYMPHSABS 0.5 (L) 11/25/2020 1406   LYMPHSABS 1.0 04/11/2020 1155   MONOABS 0.3 11/25/2020 1406   EOSABS 0.0 11/25/2020 1406   EOSABS 0.2 04/11/2020 1155   BASOSABS 0.0 11/25/2020 1406   BASOSABS 0.0 04/11/2020 1155    CMP Latest Ref Rng & Units 01/31/2021 01/30/2021 11/25/2020  Glucose 70 - 99 mg/dL 80 107(H) 122(H)  BUN 8 - 23 mg/dL 19 26(H) 24(H)  Creatinine 0.44 - 1.00 mg/dL 0.86 1.47(H) 1.03(H)  Sodium 135 - 145 mmol/L 135 137 139  Potassium 3.5 - 5.1 mmol/L 3.4(L) 3.5 4.1  Chloride 98 - 111 mmol/L 103 95(L) 103  CO2 22 - 32 mmol/L 23 30 26   Calcium 8.9 - 10.3 mg/dL 7.8(L)  9.1 8.9  Total Protein 6.5 - 8.1 g/dL 5.2(L) - 6.8  Total Bilirubin 0.3 - 1.2 mg/dL 0.5 - 0.3  Alkaline Phos 38 - 126 U/L 65 - 67  AST 15 - 41 U/L 20 - 20  ALT 0 - 44 U/L 16 - 13      Microbiology: Recent Results (from the past 240 hour(s))  Resp Panel by RT-PCR (Flu A&B, Covid) Nasopharyngeal Swab     Status: None   Collection Time: 01/30/21  4:19 PM   Specimen: Nasopharyngeal Swab; Nasopharyngeal(NP) swabs in vial transport medium  Result Value Ref Range Status   SARS Coronavirus 2 by RT PCR NEGATIVE NEGATIVE Final    Comment: (NOTE) SARS-CoV-2 target nucleic acids are NOT DETECTED.  The SARS-CoV-2 RNA is generally detectable in upper respiratory specimens during the acute phase of infection. The lowest concentration of SARS-CoV-2 viral copies this assay can detect is 138 copies/mL. A negative result does not preclude SARS-Cov-2 infection and should not be used as the sole basis for treatment or other patient management decisions. A negative result may occur with  improper specimen collection/handling, submission of specimen other than nasopharyngeal swab, presence of viral mutation(s) within the areas targeted by this assay, and inadequate number of viral copies(<138 copies/mL). A negative result must be combined with clinical observations, patient history, and epidemiological information. The expected result is Negative.  Fact Sheet for Patients:  EntrepreneurPulse.com.au  Fact Sheet for Healthcare Providers:  IncredibleEmployment.be  This test is no t yet approved or cleared by the [place] FDA and  has been authorized for detection and/or diagnosis of SARS-CoV-2 by FDA under an Emergency Use Authorization (EUA). This EUA will remain  in effect (meaning this test can be used) for the duration of the COVID-19 declaration under Section 564(b)(1) of the Act, 21 U.S.C.section 360bbb-3(b)(1), unless the authorization is terminated   or revoked sooner.       Influenza A by PCR NEGATIVE NEGATIVE Final   Influenza B by PCR NEGATIVE NEGATIVE Final    Comment: (NOTE) The Xpert Xpress SARS-CoV-2/FLU/RSV plus assay is intended as an aid in the diagnosis of influenza from Nasopharyngeal swab specimens and should not be used as a sole basis for treatment. Nasal washings and aspirates are unacceptable for Xpert Xpress SARS-CoV-2/FLU/RSV testing.  Fact Sheet for Patients: EntrepreneurPulse.com.au  Fact Sheet for Healthcare Providers:  IncredibleEmployment.be  This test is not yet approved or cleared by the [place] and has been authorized for detection and/or diagnosis of SARS-CoV-2 by FDA under an Emergency Use Authorization (EUA). This EUA will remain in effect (meaning this test can be used) for the duration of the COVID-19 declaration under Section 564(b)(1) of the Act, 21 U.S.C. section 360bbb-3(b)(1), unless the authorization is terminated or revoked.  Performed at [facility], [place], [place] [address]   Respiratory (~20 pathogens) panel by PCR     Status: None   Collection Time: 01/30/21  4:19 PM   Specimen: Nasopharyngeal Swab; Respiratory  Result Value Ref Range Status   Adenovirus NOT DETECTED NOT DETECTED Final   Coronavirus 229E NOT DETECTED NOT DETECTED Final    Comment: (NOTE) The Coronavirus on the Respiratory Panel, DOES NOT test for the novel  Coronavirus (2019 nCoV)    Coronavirus HKU1 NOT DETECTED NOT DETECTED Final   Coronavirus NL63 NOT DETECTED NOT DETECTED Final   Coronavirus OC43 NOT DETECTED NOT DETECTED Final   Metapneumovirus NOT DETECTED NOT DETECTED Final   Rhinovirus / Enterovirus NOT DETECTED NOT DETECTED Final   Influenza A NOT DETECTED NOT DETECTED Final   Influenza B NOT DETECTED NOT DETECTED Final   Parainfluenza Virus 1 NOT DETECTED NOT DETECTED Final   Parainfluenza Virus 2 NOT DETECTED NOT DETECTED  Final   Parainfluenza Virus 3 NOT DETECTED NOT DETECTED Final   Parainfluenza Virus 4 NOT DETECTED NOT DETECTED Final   Respiratory Syncytial Virus NOT DETECTED NOT DETECTED Final   Bordetella pertussis NOT DETECTED NOT DETECTED Final   Bordetella Parapertussis NOT DETECTED NOT DETECTED Final   Chlamydophila pneumoniae NOT DETECTED NOT DETECTED Final   Mycoplasma pneumoniae NOT DETECTED NOT DETECTED Final    Comment: Performed at [facility], [place]. [address] [place], [place] [address]  Blood Culture (routine x 2)     Status: None (Preliminary result)   Collection Time: 01/30/21  6:14 PM   Specimen: BLOOD  Result Value Ref Range Status   Specimen Description BLOOD BLOOD LEFT ARM  Final   Special Requests   Final    BOTTLES DRAWN AEROBIC AND ANAEROBIC Blood Culture adequate volume   Culture   Final    NO GROWTH < 12 HOURS Performed at [facility], [address], [place], [place] [address]    Report Status PENDING  Incomplete  Blood Culture (routine x 2)     Status: None (Preliminary result)   Collection Time: 01/30/21  6:14 PM   Specimen: BLOOD  Result Value Ref Range Status   Specimen Description BLOOD RIGHT ANTECUBITAL  Final   Special Requests   Final    BOTTLES DRAWN AEROBIC AND ANAEROBIC Blood Culture adequate volume   Culture   Final    NO GROWTH < 12 HOURS Performed at [facility], [address], [place], [place] [address]    Report Status PENDING  Incomplete    IMAGING RESULTS:  I have personally reviewed the films ?Central venous congestion.  Low lung volumes. CT abd Mild diverticulitis involving the mid to distal descending colon. 2. Evidence of prior gastric surgery and left lower quadrant ostomy site.  Impression/Recommendation 71 year old female with history of HFpEF, PE, bilateral TKA, diverticular perforation with colectomy and colostomy in September 2022 followed by readmission in December 2022 for fever.  Was treated like an  intra-abdominal infection even though there was no clear source for it.  She  was discharged from [facility] by ID on Zosyn for total of 6 weeks.  The end date of Zosyn was 02/23/2021.  While on Zosyn patient developed fever and hypotension and was admitted to the ICU  Fever, sepsis/SIRS-like picture  Does have a PICC line which has been removed  CT abdomen did not show any focal infection Patient is currently on cefepime, Flagyl and also received a dose of vancomycin. As patient was on Zosyn May need to exclude any ESBL organisms and hence will change to meropenem until blood cultures back.  Also because of PICC line we need to rule out MRSA and hence we will continue vancomycin until cultures are back.  __  rule out adrenal insufficiency patient has been on some prednisone for giant cell arteritis. She is currently on stress dose steroids.  Cortisol level was normal.  AKA has resolved  Giant cell arteritis was on steroids  History of PE on Eliquis  Bilateral leg edema.  Has heart failure Increased BMI patient status post gastric bypass  Discussed the management with the patient and care team. ID will follow her peripherally this weekend.  Call if needed.   ________________________________________________ Discussed with patient, requesting provider Note:  This document was prepared using Dragon voice recognition software and may include unintentional dictation errors.

## 2021-01-31 NOTE — Assessment & Plan Note (Signed)
Site doesn't look infected and is working. Colostomy was done in Sept'22 at [facility]

## 2021-01-31 NOTE — Assessment & Plan Note (Signed)
Continue Abx as above per ID. No surgical indication

## 2021-01-31 NOTE — Progress Notes (Signed)
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  1/20 - ID, Intensivist & surgery seen. Removed PICC line as potential source of infection. Started Meropenem and Vanco per ID. Stress dose steroids per ICU team. Not surgical abd and likely not the source of infection per surgery   Assessment and Plan * Severe sepsis ([place])- (present on admission) PICC line removed as potential source. CT abd not obvious for focal infection. Seen by surgery and ID. Abx switched to Meropenem and vanco per ID  Appreciate ICU team input and mgmt - added stress dose steroid. Normal cortisol  S/P PICC central line placement Removed on this admission as worrisome for potential source  Chronic diastolic CHF (congestive heart failure) ([place])- (present on admission) Well compensated at this time  Acute diverticulitis- (present on admission) Continue Abx as above per ID. No surgical indication  AKI (acute kidney injury) ([place])- (present on admission) Resolved. monitor  Colostomy in place [facility]) Site doesn't look infected and is working. Colostomy was done in Sept'22 at [facility]  Giant cell arteritis [facility]) On steroids  Chronic, continuous use of opioids- (present on admission) On low dose due to GCA. Changed to stress dose steroids on admission to ICU/SD    Subjective: not  feeling well and worried about her health. No pain. Family members at bedside.  Objective  Hypotensive, tachycardic   Constitutional:70 y morbidly obese, Ill-appearing. Not in any apparent distress HEENT: Head:Normocephalic and atraumatic.  Eyes:PERLA, EOMI, Conjunctivae are normal. Sclera is non-icteric.  Mouth/Throat:Mucous membranes are moist.  Neck:Supple with no signs of meningismus. Cardiovascular:Regular rate and rhythm. No murmurs, gallops, or rubs. 2+ symmetrical distal pulses are present . No JVD. No  LE edema.  PICC line right upper arm no surrounding erythema or rash or warmth Respiratory:Respiratory effort normal .Lungs sounds clear bilaterally. No wheezes, crackles, or rhonchi.  Gastrointestinal:Soft, non tender, non distended. Positive bowel sounds.  Colostomy is functional and no s/s of infection  Genitourinary:No CVA tenderness. Musculoskeletal:Nontender with normal range of motion in all extremities. No cyanosis, or erythema of extremities. Neurologic:  awake and alert, non focal Skin:Skin is warm, dry.  No rash or ulcers Psychiatric: Mood and affect are appropriate   Data Reviewed:  Elevated WBC and platelets, low K  Family Communication: updated son and sister at bedside  Disposition: Status is: Inpatient  Remains inpatient appropriate because: critically sick with sepsis and high risk for cardio-resp failure, multiorgan failure and death   DVT prophylaxis - Eliquis    Time spent (Critical care): 35 minutes  Author: Max Sane, MD 01/31/2021 10:20 PM  For on call review [email or web address].

## 2021-01-31 NOTE — Plan of Care (Signed)

## 2021-01-31 NOTE — Progress Notes (Signed)
Pharmacy Antibiotic Note  Wendy Sanchez is a 71 y.o. female admitted on 01/30/2021 with sepsis.  Pharmacy has been consulted for Cefepime dosing.  Plan: Cefepime 2 gm IV X 1 given in ED on 1/19 @ 1827. Cefepime 2 gm IV Q8H ordered to continue on 1/20 @ 0230.  Height: 5\' 7"  (170.2 cm) Weight: 135.9 kg (299 lb 11.2 oz) IBW/kg (Calculated) : 61.6  Temp (24hrs), Avg:101.5 F (38.6 C), Min:101.5 F (38.6 C), Max:101.5 F (38.6 C)  Recent Labs  Lab 01/30/21 1619 01/30/21 1814 01/30/21 2113 01/30/21 2302  WBC 12.5*  --   --   --   CREATININE 1.47*  --   --   --   LATICACIDVEN 2.4* 2.7* 2.1* 1.2    Estimated Creatinine Clearance: 52.1 mL/min (A) (by C-G formula based on SCr of 1.47 mg/dL (H)).    Allergies  Allergen Reactions   Hydrocodone-Acetaminophen Swelling    hands   Iodine Anaphylaxis and Swelling    Iv dye   Other Other (See Comments)    ALLERGY TO METAL - BLACKENS SKIN AND CAUSES A RASH    Tape Swelling and Other (See Comments)    Skin comes off.  Paper tape is ok tegaderm OK   Peanut Oil Other (See Comments)    ** Nuts cause runny nose   Shellfish Allergy Nausea And Vomiting and Swelling    Episode of GI infection after eating clam chowder. Still eats shrimp and other seafood    Antimicrobials this admission:   >>    >>   Dose adjustments this admission:   Microbiology results:  BCx:   UCx:    Sputum:    MRSA PCR:   Thank you for allowing pharmacy to be a part of this patients care.  Candee Hoon D 01/31/2021 12:13 AM

## 2021-01-31 NOTE — Assessment & Plan Note (Signed)
Resolved  monitor

## 2021-01-31 NOTE — Assessment & Plan Note (Signed)
On low dose due to GCA. Changed to stress dose steroids on admission to ICU/SD

## 2021-01-31 NOTE — Assessment & Plan Note (Signed)
Removed on this admission as worrisome for potential source

## 2021-01-31 NOTE — Progress Notes (Signed)
Pharmacy Antibiotic Note  Wendy Sanchez is a 71 y.o. female admitted on 01/30/2021 with sepsis.  Pharmacy has been consulted for vanco dosing.  Plan: Vancomycin 2000mg  IV q24hrs  Est parameters for dosing: AUC=507.0; Cmax=39.2; Cmin=10.9  Height: 5\' 7"  (170.2 cm) Weight: 135.9 kg (299 lb 11.2 oz) IBW/kg (Calculated) : 61.6  Temp (24hrs), Avg:99.8 F (37.7 C), Min:97.8 F (36.6 C), Max:102.9 F (39.4 C)  Recent Labs  Lab 01/30/21 1619 01/30/21 1814 01/30/21 2113 01/30/21 2302 01/31/21 0607  WBC 12.5*  --   --   --  15.9*  CREATININE 1.47*  --   --   --  0.86  LATICACIDVEN 2.4* 2.7* 2.1* 1.2  --     Estimated Creatinine Clearance: 89 mL/min (by C-G formula based on SCr of 0.86 mg/dL).    Allergies  Allergen Reactions   Hydrocodone-Acetaminophen Swelling    hands   Iodine Anaphylaxis and Swelling    Iv dye   Other Other (See Comments)    ALLERGY TO METAL - BLACKENS SKIN AND CAUSES A RASH    Tape Swelling and Other (See Comments)    Skin comes off.  Paper tape is ok tegaderm OK   Peanut Oil Other (See Comments)    ** Nuts cause runny nose   Shellfish Allergy Nausea And Vomiting and Swelling    Episode of GI infection after eating clam chowder. Still eats shrimp and other seafood    Antimicrobials this admission: Cefepime 2gm q8hrs: 1/19 >>  Metronidazole 500mg  IV q12hrs: 1/19 >>  Vanco 2000mg  1/19 x 1 dose Vanco 2000mg  q24hrs: 1/20 >>   Thank you for allowing pharmacy to be a part of this patients care.  Berta Minor, RPh 01/31/2021 8:08 PM

## 2021-01-31 NOTE — Consult Note (Signed)
Date of Consultation:  01/31/2021  Requesting Physician:  Judd Gaudier, MD  Reason for Consultation:  Acute diverticulitis  History of Present Illness: Wendy Sanchez is a 71 y.o. female admitted last night to medical team with fevers, chills, and tachycardia.  The patient's temperature was 101.5.  The patient has a recent history of complicated abdominal infections.  She had a Hartman's procedure in September of last year for perforated diverticulitis and was admitted to [facility] and December 2022 for septic shock with abdominal fluid collection that was not amenable for drainage.  She has been on chronic antibiotics with current antibiotic end date of 02/13/2021.  In the emergency room, she did have a fever of 101.5 yesterday afternoon with initial heart rate of 116 and normal blood pressure of 132/67.  She had lab work done which showed yesterday lactic acid of 2.4, AKI with a creatinine of 1.47 and a Koppen blood cell count of 12.5.  Today, the Hagin blood cell count is more elevated to 15.9 by her lactic acid is now normal at 1.2 and her creatinine is normal to 0.86.  She had a CT scan of the abdomen and pelvis which is only showing very minimally inflamed diverticula in the descending colon proximal to the patient's end colostomy consistent with mild diverticulitis.  I do not see any significant pelvic fluid or ascites or abscess.  This morning, the patient is afebrile although with still some tachycardia.  She denies any significant abdominal pain.  She is very sleepy this morning, so is very difficult to obtain a clear history from her.  Past Medical History: Past Medical History:  Diagnosis Date   Allergic rhinitis    Arthritis    Asthma    problems with fumes and aerosols cause asthma   Chest pain 10/19/2016   CHF (congestive heart failure) (HCC)    Complication of anesthesia    awakens during surgery; has occurred with last 3-4 surgeries    COPD (chronic obstructive pulmonary disease) ([place])     DVT (deep venous thrombosis) (HCC) 07/13/2018   Right leg   Environmental allergies    fumes    Fibromyalgia    GERD (gastroesophageal reflux disease)    Headache    Hemorrhoids    History of bronchitis    Hx of total knee arthroplasty 12/13/2015   Hyperlipidemia    Hypertension    Morbid obesity with BMI of 45.0-49.9, adult ([place]) 07/25/2015   NICM (nonischemic cardiomyopathy) ([place])    a. ? PVC mediated;  b. 06/2016 Echo: EF 25-30%, mild LVH, mild LAE, mild AI/MR/TR/PR; c. 08/2016 Cath: nl cors, EF 25%.   Numbness    hands bilat when driving; improves when not preforming task    Osteoarthritis of left knee 08/02/2015   PE (pulmonary thromboembolism) ([place]) 07/13/2018   Pes anserinus bursitis of left knee 05/18/2016   Presence of right artificial knee joint 05/18/2016   PVC (premature ventricular contraction) 06/04/2017   PVC's (premature ventricular contractions)    a. 11/2016 [place] started;  b. 11/29/16 48h Holter: 71245 PVC's (41%); c. 12/2016 24h Holter: 18040 PVC's (15%); c. 02/2017 48h Holter: 37262 PVC's (41%).   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   Temporal arteritis ([place]) 05/01/2020   Tinnitus    comes and goes    Unilateral primary osteoarthritis, right knee 12/13/2015  Vertigo    none for over 2 yrs     Past Surgical History: Past Surgical History:  Procedure Laterality Date   ABDOMINAL HYSTERECTOMY  1979   left ovary remains   ABDOMINAL HYSTERECTOMY     ARTERY BIOPSY Left 06/07/2019   Procedure: BIOPSY TEMPORAL ARTERY;  Surgeon: Algernon Huxley, MD;  Location: [facility] ORS;  Service: Vascular;  Laterality: Left;   CHOLECYSTECTOMY     COLONOSCOPY     COLONOSCOPY N/A 10/30/2019   Procedure: COLONOSCOPY;  Surgeon: Lesly Rubenstein, MD;  Location: [facility] ENDOSCOPY;  Service: Endoscopy;  Laterality: N/A;   ESOPHAGOGASTRODUODENOSCOPY N/A 10/30/2019    Procedure: ESOPHAGOGASTRODUODENOSCOPY (EGD);  Surgeon: Lesly Rubenstein, MD;  Location: [facility] ENDOSCOPY;  Service: Endoscopy;  Laterality: N/A;   fibrous tissue removed from right shoulder and back of neck      2 years ago    [place] N/A 02/28/2019   Procedure: SALIVARY GLAND BIOPSY;  Surgeon: Carloyn Manner, MD;  Location: [facility] ORS;  Service: ENT;  Laterality: N/A;   FRACTURE SURGERY     left small finger   GANGLION CYST EXCISION     GASTRIC BYPASS  1980   states had bypass with banding and band left in -   GASTRIC BYPASS OPEN     JOINT REPLACEMENT Bilateral 2017   knees   KNEE SURGERY Bilateral    both knees  2001   NECK SURGERY N/A 1205/19   ganglion cyst removal, benigh    PULMONARY THROMBECTOMY Bilateral 07/14/2018   Procedure: PULMONARY THROMBECTOMY;  Surgeon: Algernon Huxley, MD;  Location: [place] CV LAB;  Service: Cardiovascular;  Laterality: Bilateral;   PVC ABLATION N/A 06/04/2017   Procedure: PVC ABLATION;  Surgeon: Evans Lance, MD;  Location: [place] [facility];  Service: Cardiovascular;  Laterality: N/A;   RIGHT/LEFT HEART CATH AND CORONARY ANGIOGRAPHY Bilateral 08/12/2016   Procedure: Right/Left Heart Cath and Coronary Angiography;  Surgeon: Yolonda Kida, MD;  Location: [place] [facility];  Service: Cardiovascular;  Laterality: Bilateral;   TONSILLECTOMY     age 56   TOTAL KNEE ARTHROPLASTY Left 08/02/2015   Procedure: LEFT TOTAL KNEE ARTHROPLASTY;  Surgeon: Mcarthur Rossetti, MD;  Location: [facility] ORS;  Service: Orthopedics;  Laterality: Left;   TOTAL KNEE ARTHROPLASTY Right 12/13/2015   Procedure: RIGHT TOTAL KNEE ARTHROPLASTY;  Surgeon: Mcarthur Rossetti, MD;  Location: [facility] ORS;  Service: Orthopedics;  Laterality: Right;    Home Medications: Prior to Admission medications   Medication Sig Start Date End Date Taking? Authorizing Provider  levocetirizine (XYZAL) 5 MG tablet Take 5 mg by mouth every evening.   Yes [provider]  nortriptyline (PAMELOR) 50 MG capsule Take 50 mg by mouth at bedtime.   Yes [provider]  nystatin cream (MYCOSTATIN) Apply 1 application topically 2 (two) times daily. (Apply under skin folds)   Yes [provider]  oxyCODONE-acetaminophen (PERCOCET) 10-325 MG tablet Take 1 tablet by mouth every 4 (four) hours as needed for pain. 01/25/21 02/24/21 Yes Molli Barrows, MD  pantoprazole (PROTONIX) 40 MG tablet Take 40 mg by mouth daily.   Yes [provider]  piperacillin-tazobactam (ZOSYN) 4.5 (4-0.5) g injection Inject 4.5 g into the vein every 8 (eight) hours. 01/10/21 02/24/21 Yes [provider]  potassium chloride SA (KLOR-CON M) 20 MEQ tablet Take 20 mEq by mouth 2 (two) times daily.   Yes [provider]  predniSONE (DELTASONE) 10 MG tablet Take  20 mg by mouth in the morning.   Yes [provider]  spironolactone (ALDACTONE) 25 MG tablet Take 25 mg by mouth daily. 09/18/20  Yes [provider]  sulfamethoxazole-trimethoprim (BACTRIM DS) 800-160 MG tablet Take 1 tablet by mouth every Monday, Wednesday, and Friday. 01/10/21  Yes [provider]  torsemide (DEMADEX) 20 MG tablet Take 2 tablets (40 mg) by mouth once daily Patient taking differently: Take 60 mg by mouth every Monday, Wednesday, and Friday. Take 2 tablets (40 mg) by mouth once daily 04/12/20  Yes Deboraha Sprang, MD  acetaminophen (TYLENOL) 325 MG tablet Take 2 tablets (650 mg total) by mouth every 6 (six) hours as needed for mild pain or fever. 11/17/20   Cristal Deer, MD  acidophilus (RISAQUAD) CAPS capsule Take 1 capsule by mouth in the morning. Patient not taking: Reported on 01/30/2021    [provider]  albuterol (PROVENTIL HFA;VENTOLIN HFA) 108 (90 Base) MCG/ACT inhaler Inhale 2 puffs into the lungs every 6 (six) hours as needed for wheezing or shortness of breath. Patient not taking: Reported on 01/30/2021    [provider]   alendronate (FOSAMAX) 35 MG tablet Take 35 mg by mouth every Saturday. Take with a full glass of water on an empty stomach. Patient not taking: Reported on 01/30/2021    [provider]  amoxicillin-clavulanate (AUGMENTIN) 875-125 MG tablet Take 1 tablet by mouth 2 (two) times daily. Patient not taking: Reported on 01/30/2021 12/03/20   [provider]  apixaban (ELIQUIS) 5 MG TABS tablet Take 1 tablet (5 mg total) by mouth 2 (two) times daily. 07/22/18 11/25/20  Saundra Shelling, MD  ascorbic acid (VITAMIN C) 500 MG tablet Take 500 mg by mouth 2 (two) times daily. Patient not taking: Reported on 11/25/2020    [provider]  atorvastatin (LIPITOR) 40 MG tablet Take 40 mg by mouth daily. Patient not taking: Reported on 01/30/2021 11/26/20   [provider]  azelastine (ASTELIN) 0.1 % nasal spray Place 1 spray into both nostrils 2 (two) times daily. Patient not taking: Reported on 11/25/2020    [provider]  calcium carbonate (OS-CAL - DOSED IN MG OF ELEMENTAL CALCIUM) 1250 (500 Ca) MG tablet Take 2 tablets by mouth in the morning.    [provider]  diclofenac sodium (VOLTAREN) 1 % GEL Apply 2 g topically in the morning. (Apply to most painful area) Patient not taking: Reported on 11/25/2020    [provider]  diphenhydrAMINE (BENADRYL) 25 mg capsule Take 25 mg by mouth every 6 (six) hours as needed for itching or allergies.    [provider]  docusate sodium (COLACE) 100 MG capsule Take 200 mg by mouth at bedtime as needed for mild constipation.    [provider]  fluconazole (DIFLUCAN) 100 MG tablet Take 100 mg by mouth daily. Patient not taking: Reported on 01/30/2021 11/26/20   [provider]  folic acid (FOLVITE) 1 MG tablet Take 1 mg by mouth daily. Patient not taking: Reported on 01/30/2021    [provider]  gabapentin (NEURONTIN) 300 MG capsule Take 600 mg by mouth 2 (two) times daily  as needed. Patient not taking: Reported on 01/30/2021 11/26/20   [provider]  gabapentin (NEURONTIN) 600 MG tablet Take 1 tablet (600 mg total) by mouth 3 (three) times daily. Patient not taking: Reported on 01/30/2021 01/15/21 02/14/21  Molli Barrows, MD  ibuprofen (ADVIL) 600 MG tablet Take 1 tablet (600 mg total)  by mouth every 6 (six) hours as needed for moderate pain (severe pain). Patient not taking: Reported on 11/25/2020 11/17/20   Cristal Deer, MD  ipratropium-albuterol (DUONEB) 0.5-2.5 (3) MG/3ML SOLN Take 3 mLs by nebulization every 6 (six) hours as needed (shortness of breath).    [provider]  magnesium oxide (MAG-OX) 400 MG tablet Take 800 mg by mouth daily. Patient not taking: Reported on 11/25/2020    [provider]  metoprolol tartrate (LOPRESSOR) 25 MG tablet Take 0.5 tablets (12.5 mg total) by mouth daily. Patient not taking: Reported on 11/25/2020 11/18/20   Cristal Deer, MD  montelukast (SINGULAIR) 10 MG tablet Take 10 mg by mouth at bedtime. Patient not taking: Reported on 01/30/2021    [provider]  Multiple Vitamins-Minerals (MULTIVITAMIN WITH MINERALS) tablet Take 1 tablet by mouth daily. Patient not taking: Reported on 11/25/2020    [provider]  nystatin (MYCOSTATIN) 100000 UNIT/ML suspension Take 5 mLs by mouth 4 (four) times daily. 12/03/20   [provider]  oxyCODONE-acetaminophen (PERCOCET) 10-325 MG tablet Take 1 tablet by mouth every 4 (four) hours as needed for pain. 02/24/21 03/26/21  Molli Barrows, MD  polyethylene glycol (MIRALAX / GLYCOLAX) 17 g packet Take 17 g by mouth daily as needed for moderate constipation. 11/17/20   Cristal Deer, MD  sacubitril-valsartan (ENTRESTO) 24-26 MG Take 1 tablet by mouth 2 (two) times daily. Patient not taking: Reported on 11/25/2020    [provider]  sucralfate (CARAFATE) 1 G tablet Take 1 g by mouth 2 (two) times daily before a meal. Patient  not taking: Reported on 01/30/2021    [provider]  umeclidinium-vilanterol (ANORO ELLIPTA) 62.5-25 MCG/INH AEPB Inhale 1 puff into the lungs daily. Patient not taking: Reported on 01/30/2021 12/23/17   [provider]  Vitamin D, Ergocalciferol, (DRISDOL) 50000 UNITS CAPS capsule Take 50,000 Units by mouth every Monday. Patient not taking: Reported on 01/30/2021    [provider]  Wound Cleansers (VASHE WOUND THERAPY) SOLN Apply 1 application topically daily. (Irrigate abdominal wound)    [provider]    Allergies: Allergies  Allergen Reactions   Hydrocodone-Acetaminophen Swelling    hands   Iodine Anaphylaxis and Swelling    Iv dye   Other Other (See Comments)    ALLERGY TO METAL - BLACKENS SKIN AND CAUSES A RASH    Tape Swelling and Other (See Comments)    Skin comes off.  Paper tape is ok tegaderm OK   Peanut Oil Other (See Comments)    ** Nuts cause runny nose   Shellfish Allergy Nausea And Vomiting and Swelling    Episode of GI infection after eating clam chowder. Still eats shrimp and other seafood    Social History:  reports that she quit smoking about 5 months ago. Her smoking use included cigarettes. She has a 12.50 pack-year smoking history. Her smokeless tobacco use includes chew. She reports that she does not drink alcohol and does not use drugs.   Family History: Family History  Problem Relation Age of Onset   Hypertension Father    Deep vein thrombosis Father    Dementia Mother    Diabetes Sister    Congestive Heart Failure Sister    Congestive Heart Failure Brother    Congestive Heart Failure Daughter    Congestive Heart Failure Son    Breast cancer Maternal Aunt        great aunt    Review of Systems: Review of  Systems  Constitutional:  Positive for chills and fever.  HENT:  Negative for hearing loss.   Respiratory:  Negative for shortness of breath.   Cardiovascular:  Negative for chest pain.  Gastrointestinal:   Positive for abdominal pain and nausea. Negative for diarrhea and vomiting.  Genitourinary:  Negative for dysuria.  Musculoskeletal:  Negative for myalgias.  Skin:  Negative for rash.  Neurological:  Negative for dizziness.  Psychiatric/Behavioral:  Negative for depression.    Physical Exam BP 123/61    Pulse (!) 127    Temp 98.8 F (37.1 C) (Oral)    Resp 20    Ht 5\' 7"  (1.702 m)    Wt 135.9 kg    SpO2 97%    BMI 46.94 kg/m  CONSTITUTIONAL: No acute distress the patient is very sleepy this morning HEENT:  Normocephalic, atraumatic, extraocular motion intact. NECK: Trachea is midline, and there is no jugular venous distension. RESPIRATORY:  Normal respiratory effort without pathologic use of accessory muscles. CARDIOVASCULAR: Sinus tachycardia. GI: The abdomen is soft, obese, nondistended, with mild discomfort to palpation in the left side of the abdomen and some mild pain in the right lower quadrant but is hard to get a good exam on her as she is very sleepy.  No peritonitis.  MUSCULOSKELETAL: Unable to fully evaluate SKIN: Skin turgor is normal. There are no pathologic skin lesions.  NEUROLOGIC: Unable to fully evaluate PSYCH: Unable to fully evaluate  Laboratory Analysis: Results for orders placed or performed during the hospital encounter of 01/30/21 (from the past 24 hour(s))  Resp Panel by RT-PCR (Flu A&B, Covid) Nasopharyngeal Swab     Status: None   Collection Time: 01/30/21  4:19 PM   Specimen: Nasopharyngeal Swab; Nasopharyngeal(NP) swabs in vial transport medium  Result Value Ref Range   SARS Coronavirus 2 by RT PCR NEGATIVE NEGATIVE   Influenza A by PCR NEGATIVE NEGATIVE   Influenza B by PCR NEGATIVE NEGATIVE  Lactic acid, plasma     Status: Abnormal   Collection Time: 01/30/21  4:19 PM  Result Value Ref Range   Lactic Acid, Venous 2.4 (HH) 0.5 - 1.9 mmol/L  CBC     Status: Abnormal   Collection Time: 01/30/21  4:19 PM  Result Value Ref Range   WBC 12.5 (H) 4.0 -  10.5 K/uL   RBC 4.22 3.87 - 5.11 MIL/uL   Hemoglobin 9.7 (L) 12.0 - 15.0 g/dL   HCT 32.6 (L) 36.0 - 46.0 %   MCV 77.3 (L) 80.0 - 100.0 fL   MCH 23.0 (L) 26.0 - 34.0 pg   MCHC 29.8 (L) 30.0 - 36.0 g/dL   RDW 19.8 (H) 11.5 - 15.5 %   Platelets 440 (H) 150 - 400 K/uL   nRBC 0.0 0.0 - 0.2 %  Basic metabolic panel     Status: Abnormal   Collection Time: 01/30/21  4:19 PM  Result Value Ref Range   Sodium 137 135 - 145 mmol/L   Potassium 3.5 3.5 - 5.1 mmol/L   Chloride 95 (L) 98 - 111 mmol/L   CO2 30 22 - 32 mmol/L   Glucose, Bld 107 (H) 70 - 99 mg/dL   BUN 26 (H) 8 - 23 mg/dL   Creatinine, Ser 1.47 (H) 0.44 - 1.00 mg/dL   Calcium 9.1 8.9 - 10.3 mg/dL   GFR, Estimated 38 (L) >60 mL/min   Anion gap 12 5 - 15  Lactic acid, plasma     Status: Abnormal   Collection  Time: 01/30/21  6:14 PM  Result Value Ref Range   Lactic Acid, Venous 2.7 (HH) 0.5 - 1.9 mmol/L  Protime-INR     Status: Abnormal   Collection Time: 01/30/21  6:14 PM  Result Value Ref Range   Prothrombin Time 16.0 (H) 11.4 - 15.2 seconds   INR 1.3 (H) 0.8 - 1.2  APTT     Status: None   Collection Time: 01/30/21  6:14 PM  Result Value Ref Range   aPTT 32 24 - 36 seconds  Blood Culture (routine x 2)     Status: None (Preliminary result)   Collection Time: 01/30/21  6:14 PM   Specimen: BLOOD  Result Value Ref Range   Specimen Description BLOOD BLOOD LEFT ARM    Special Requests      BOTTLES DRAWN AEROBIC AND ANAEROBIC Blood Culture adequate volume   Culture      NO GROWTH < 12 HOURS Performed at [facility], [address] [place], [place] [address]    Report Status PENDING   Blood Culture (routine x 2)     Status: None (Preliminary result)   Collection Time: 01/30/21  6:14 PM   Specimen: BLOOD  Result Value Ref Range   Specimen Description BLOOD RIGHT ANTECUBITAL    Special Requests      BOTTLES DRAWN AEROBIC AND ANAEROBIC Blood Culture adequate volume   Culture      NO GROWTH < 12 HOURS Performed  at [facility], [address] [place],  [address]    Report Status PENDING   Urinalysis, Complete w Microscopic Urine, Clean Catch     Status: Abnormal   Collection Time: 01/30/21  6:31 PM  Result Value Ref Range   Color, Urine YELLOW (A) YELLOW   APPearance CLEAR (A) CLEAR   Specific Gravity, Urine 1.011 1.005 - 1.030   pH 5.0 5.0 - 8.0   Glucose, UA NEGATIVE NEGATIVE mg/dL   Hgb urine dipstick NEGATIVE NEGATIVE   Bilirubin Urine NEGATIVE NEGATIVE   Ketones, ur NEGATIVE NEGATIVE mg/dL   Protein, ur NEGATIVE NEGATIVE mg/dL   Nitrite NEGATIVE NEGATIVE   Leukocytes,Ua NEGATIVE NEGATIVE   RBC / HPF 0-5 0 - 5 RBC/hpf   WBC, UA 0-5 0 - 5 WBC/hpf   Bacteria, UA NONE SEEN NONE SEEN   Squamous Epithelial / LPF 0-5 0 - 5  Lactic acid, plasma     Status: Abnormal   Collection Time: 01/30/21  9:13 PM  Result Value Ref Range   Lactic Acid, Venous 2.1 (HH) 0.5 - 1.9 mmol/L  Lactic acid, plasma     Status: None   Collection Time: 01/30/21 11:02 PM  Result Value Ref Range   Lactic Acid, Venous 1.2 0.5 - 1.9 mmol/L  Protime-INR     Status: None   Collection Time: 01/31/21  6:07 AM  Result Value Ref Range   Prothrombin Time 14.8 11.4 - 15.2 seconds   INR 1.2 0.8 - 1.2  Procalcitonin     Status: None   Collection Time: 01/31/21  6:07 AM  Result Value Ref Range   Procalcitonin 0.17 ng/mL  CBC     Status: Abnormal   Collection Time: 01/31/21  6:07 AM  Result Value Ref Range   WBC 15.9 (H) 4.0 - 10.5 K/uL   RBC 3.97 3.87 - 5.11 MIL/uL   Hemoglobin 9.1 (L) 12.0 - 15.0 g/dL   HCT 30.6 (L) 36.0 - 46.0 %   MCV 77.1 (L) 80.0 - 100.0 fL  MCH 22.9 (L) 26.0 - 34.0 pg   MCHC 29.7 (L) 30.0 - 36.0 g/dL   RDW 19.6 (H) 11.5 - 15.5 %   Platelets 417 (H) 150 - 400 K/uL   nRBC 0.0 0.0 - 0.2 %  Comprehensive metabolic panel     Status: Abnormal   Collection Time: 01/31/21  6:07 AM  Result Value Ref Range   Sodium 135 135 - 145 mmol/L   Potassium 3.4 (L) 3.5 - 5.1 mmol/L    Chloride 103 98 - 111 mmol/L   CO2 23 22 - 32 mmol/L   Glucose, Bld 80 70 - 99 mg/dL   BUN 19 8 - 23 mg/dL   Creatinine, Ser 0.86 0.44 - 1.00 mg/dL   Calcium 7.8 (L) 8.9 - 10.3 mg/dL   Total Protein 5.2 (L) 6.5 - 8.1 g/dL   Albumin 1.7 (L) 3.5 - 5.0 g/dL   AST 20 15 - 41 U/L   ALT 16 0 - 44 U/L   Alkaline Phosphatase 65 38 - 126 U/L   Total Bilirubin 0.5 0.3 - 1.2 mg/dL   GFR, Estimated >60 >60 mL/min   Anion gap 9 5 - 15    Imaging: CT ABDOMEN PELVIS WO CONTRAST  Result Date: 01/30/2021 CLINICAL DATA:  Shivering and headache. On antibiotics for diverticulitis. EXAM: CT ABDOMEN AND PELVIS WITHOUT CONTRAST TECHNIQUE: Multidetector CT imaging of the abdomen and pelvis was performed following the standard protocol without IV contrast. RADIATION DOSE REDUCTION: This exam was performed according to the departmental dose-optimization program which includes automated exposure control, adjustment of the mA and/or kV according to patient size and/or use of iterative reconstruction technique. COMPARISON:  July 31, 2013 FINDINGS: Lower chest: Mild atelectasis is seen within the posterior aspects of the bilateral lung bases. Hepatobiliary: Punctate calcified granulomas are seen scattered throughout the liver parenchyma. Status post cholecystectomy. No biliary dilatation. Pancreas: Unremarkable. No pancreatic ductal dilatation or surrounding inflammatory changes. Spleen: Normal in size without focal abnormality. Adrenals/Urinary Tract: Adrenal glands are unremarkable. Kidneys are normal in size, without renal calculi or hydronephrosis. 5 mm and 10 mm hyperdense foci are seen within the right kidney. A similar appearing 7 mm hyperdense focus is noted within the posterolateral aspect of the mid to lower left kidney. Bladder is unremarkable. Stomach/Bowel: There is a very small hiatal hernia. Surgical sutures are noted within the gastric region. Appendix appears normal. No evidence of bowel dilatation. Mildly  inflamed diverticula are seen within the mid to distal descending colon. There is no evidence of associated perforation or abscess. Noninflamed diverticula are seen throughout the remainder of the large bowel. Vascular/Lymphatic: Aortic atherosclerosis. No enlarged abdominal or pelvic lymph nodes. Reproductive: Status post hysterectomy. No adnexal masses. Other: A left lower quadrant ostomy site is present. No abdominopelvic ascites. Musculoskeletal: Multilevel degenerative changes are seen throughout the lumbar spine with a chronic fracture deformity seen along the superior endplate of the U13 vertebral body. IMPRESSION: 1. Mild diverticulitis involving the mid to distal descending colon. 2. Evidence of prior gastric surgery and left lower quadrant ostomy site. 3. Small hiatal hernia. 4. Findings likely consistent with prior cholecystectomy and hysterectomy. 5. Chronic fracture deformity along the superior endplate of the K44 vertebral body. 6. Aortic atherosclerosis. Aortic Atherosclerosis (ICD10-I70.0). Electronically Signed   By: Virgina Norfolk M.D.   On: 01/30/2021 22:08   DG Chest 2 View  Result Date: 01/30/2021 CLINICAL DATA:  Best images obtainable due to patients condition. [facility]  and generalized bodyaches. Pt alert and oriented. NAD noted Pt currently on antibiotics for "infection"infection EXAM: CHEST - 2 VIEW COMPARISON:  None. FINDINGS: RIGHT PICC line tip in distal SVC. Stable large cardiac silhouette. Mild central venous congestion. No focal infiltrate. No pneumothorax. IMPRESSION: Central venous congestion.  Low lung volumes. Electronically Signed   By: Suzy Bouchard M.D.   On: 01/30/2021 18:22    Assessment and Plan: This is a 71 y.o. female with complicated history of multiple infections currently on chronic antibiotics.  - I have personally viewed the patient's imaging study from last night.  Overall, there is only very minimal stranding in the  descending colon proximal to the end colostomy with no evidence of perforation, abscess, or free fluid.  I do not think that this is severe enough to be causing her the fevers chills and tachycardia as the diverticulitis is very mild on CT scan.  Would agree with current diet order for clear liquid diet and would continue with IV antibiotics, while searching for other potential sources for her current condition. -As the patient is more awake, will be able to get a better exam on her and provide further recommendations.  Potentially will be able to advance her diet tomorrow depending how she is feeling.  I spent 60 minutes dedicated to the care of this patient on the date of this encounter to include pre-visit review of records, face-to-face time with the patient discussing diagnosis and management, and any post-visit coordination of care.   Melvyn Neth, MD [facility]:  [phone]

## 2021-01-31 NOTE — Assessment & Plan Note (Signed)
Well compensated at this time 

## 2021-01-31 NOTE — Consult Note (Signed)
NAME:  Wendy Sanchez, MRN:  [id number], DOB:  18-Mar-1950, LOS: 1 ADMISSION DATE:  01/30/2021, CONSULTATION DATE:  01/31/21 REFERRING MD:  [name], CHIEF COMPLAINT:  Fever, concern for developing septic shock   Brief Pt Description / Synopsis:  71 y.o. Female admitted with severe sepsis due to Acute Diverticulitis.  On 1/20 briefly hypotensive concerning for developing septic shock.  History of Present Illness:  Wendy Sanchez is a 71 year old female with a past medical history significant for COPD, CHF, hypertension, PE on Eliquis, giant cell arteritis on prednisone, who presents from her skilled nursing facility due to chills, rigors, and fever up to 101.5.  Patient denies chest pain, shortness of breath, cough, sputum production, nausea, vomiting, diarrhea, abdominal pain, dysuria.  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 Also she was recently hospitalized at [facility] from 12/24/2020 through 01/15/2021 for septic shock secondary to intra-abdominal abscesses which were not amendable to drainage by IR.  She was discharged to SNF on IV Zosyn via PICC line (plan antibiotic course 12/25 through 02/23/2021) with outpatient ID follow-up and repeat CT scan 4 weeks from discharge.  ED Course: Initial vital signs: Temperature 101.5 F orally, respiratory rate 20, pulse 116, blood pressure 132/67, SPO2 95% on room air Significant labs: BUN 26, creatinine 1.47, lactic acid 2.4, WBC 12.5, hemoglobin 9.7, hematocrit 32.6, MCV 77.3, MCH 23, MCHC 29.8, platelets 440, INR 1.3, PT 16 COVID-19 and influenza PCR negative Urinalysis not consistent with UTI EKG: Sinus tachycardia with nonspecific ST T wave changes Imaging: Chest x-ray>>RIGHT PICC line tip in distal SVC. Stable large cardiac silhouette. Mild central venous congestion. No focal infiltrate. No pneumothorax. CT abdomen pelvis without contrast>>IMPRESSION: 1. Mild diverticulitis involving the mid to distal descending  colon. 2. Evidence of prior gastric surgery and left lower quadrant ostomy site. 3. Small hiatal hernia. 4. Findings likely consistent with prior cholecystectomy and hysterectomy. 5. Chronic fracture deformity along the superior endplate of the Y17 vertebral body. 6. Aortic atherosclerosis. Medications given: 1 L normal saline bolus, IV cefepime and Flagyl  She met sepsis criteria therefore cultures were obtained and broad-spectrum antibiotics administered along with IV fluids.  Hospitalist were asked to admit for severe sepsis due to acute diverticulitis.  Interval History: While boarding in the ED on 1/20, she continues to have fluctuating fevers, and became briefly hypotensive with blood pressure 88/59.  PCCM is asked to evaluate the patient due to concerns for developing septic shock.  Pertinent medical history:  COPD CHF HTN PE on Eliquis giant cell arteritis on prednisone colostomy 11/2020 s/p perforated sigmoid diverticulitis,   Micro Data:  01/30/2021: SARS-CoV-2 and influenza PCR>> negative 01/30/2021: Blood culture x2>> 01/30/2021: Urine>> 01/31/2021: Respiratory viral panel>> 01/31/2021: MRSA PCR>>  Antimicrobials:  Vancomycin 1/19 x 1 dose Cefepime 1/19>> Flagyl 1/19>>  Significant Hospital Events: Including procedures, antibiotic start and stop dates in addition to other pertinent events   01/30/2021: Presents to ED from SNF, to be admitted by the hospitalist 01/31/2021: Briefly hypotensive concerning for developing septic shock, PCCM consulted.  Given albumin and placed on stress dose steroids with improvement.  ID consulted  Interim History / Subjective:  -This morning and while boarding in ED, patient with fluctuating fevers, and briefly hypotensive concerning for developing septic shock -PCCM consulted ~given albumin and started on stress dose steroids with improvement in blood pressure -ID consulted   Objective   Blood pressure (!) 106/58, pulse (!) 120,  temperature (!) 100.9 F (38.3 C), temperature source  Oral, resp. rate (!) 22, height '5\' 7"'  (1.702 m), weight 135.9 kg, SpO2 94 %.        Intake/Output Summary (Last 24 hours) at 01/31/2021 1110 Last data filed at 01/31/2021 0418 Gross per 24 hour  Intake 1995.41 ml  Output 800 ml  Net 1195.41 ml   Filed Weights   01/30/21 1615  Weight: 135.9 kg    Examination: General: Acute on chronically ill-appearing obese female, sitting in bed, on room air, no acute distress HENT: Atraumatic, normocephalic, neck supple, no JVD Lungs: Clear breath sounds bilaterally, even, nonlabored Cardiovascular: Tachycardia, regular rhythm, S1-S2, no murmurs, rubs, gallops Abdomen: Obese, soft, slightly tender throughout, no guarding or rebound tenderness, bowel sounds positive x4 Extremities: Nontender with normal range of motion in all extremities.  No cyanosis, or erythema of extremities Neuro: Awake, alert and oriented x3, follows commands, no focal deficits, speech clear GU: External catheter in place  Resolved Hospital Problem list     Assessment & Plan:   Severe Sepsis in the setting of Acute Diverticulitis Chronic Immunosuppression/chronic Prednisone -Monitor fever curve -Trend WBC's & Procalcitonin -Follow cultures as above -Continue empiric Cefepime, Flagyl pending cultures & sensitivities -General Surgery following, appreciate input ~ only very minimal stranding in the descending colon proximal to the end colostomy with no evidence of perforation, abscess, or free fluid -Will check RVP, C. Diff panel to rule out other sources of sepsis -Recommend removal of PICC line (been in place since Dec. 2022) -ID consulted, appreciate input  Brief Episode of Hypotension, concern for developing septic shock PMHx: hypertension, hyperlipidemia, DVT of the right leg & PE in 07/2018 -Continuous cardiac monitoring -Maintain MAP >65 -Gentle IV fluids given prior history of HFrEF -Vasopressors as needed  to maintain MAP goal -Albumin x1 -Start stress steroids given chronic steroid use -Trend lactic acid until normalized (2.4 ~ 2.7 ~ 2.1 ~ 1.2) -Echocardiogram 12/26/2020: LVEF 50-03%, normal diastolic function, normal RV systolic function    Best Practice (right click and "Reselect all SmartList Selections" daily)   Diet/type: NPO DVT prophylaxis: DOAC GI prophylaxis: N/A Lines: N/A Foley:  N/A Code Status:  full code Last date of multidisciplinary goals of care discussion [N/A]  Pt's family updated at bedside 1/20.  Labs   CBC: Recent Labs  Lab 01/30/21 1619 01/31/21 0607  WBC 12.5* 15.9*  HGB 9.7* 9.1*  HCT 32.6* 30.6*  MCV 77.3* 77.1*  PLT 440* 417*    Basic Metabolic Panel: Recent Labs  Lab 01/30/21 1619 01/31/21 0607  NA 137 135  K 3.5 3.4*  CL 95* 103  CO2 30 23  GLUCOSE 107* 80  BUN 26* 19  CREATININE 1.47* 0.86  CALCIUM 9.1 7.8*   GFR: Estimated Creatinine Clearance: 89 mL/min (by C-G formula based on SCr of 0.86 mg/dL). Recent Labs  Lab 01/30/21 1619 01/30/21 1814 01/30/21 2113 01/30/21 2302 01/31/21 0607  PROCALCITON  --   --   --   --  0.17  WBC 12.5*  --   --   --  15.9*  LATICACIDVEN 2.4* 2.7* 2.1* 1.2  --     Liver Function Tests: Recent Labs  Lab 01/31/21 0607  AST 20  ALT 16  ALKPHOS 65  BILITOT 0.5  PROT 5.2*  ALBUMIN 1.7*   No results for input(s): LIPASE, AMYLASE in the last 168 hours. No results for input(s): AMMONIA in the last 168 hours.  ABG No results found for: PHART, PCO2ART, PO2ART, HCO3, TCO2, ACIDBASEDEF, O2SAT   Coagulation Profile:  Recent Labs  Lab 01/30/21 1814 01/31/21 0607  INR 1.3* 1.2    Cardiac Enzymes: No results for input(s): CKTOTAL, CKMB, CKMBINDEX, TROPONINI in the last 168 hours.  HbA1C: No results found for: HGBA1C  CBG: No results for input(s): GLUCAP in the last 168 hours.  Review of Systems:   Positives in BOLD: Gen: Denies fever, chills, weight change, fatigue, night  sweats HEENT: Denies blurred vision, double vision, hearing loss, tinnitus, sinus congestion, rhinorrhea, sore throat, neck stiffness, dysphagia PULM: Denies shortness of breath, cough, sputum production, hemoptysis, wheezing CV: Denies chest pain, edema, orthopnea, paroxysmal nocturnal dyspnea, palpitations GI: Denies abdominal pain, nausea, vomiting, diarrhea, hematochezia, melena, constipation, change in bowel habits GU: Denies dysuria, hematuria, polyuria, oliguria, urethral discharge Endocrine: Denies hot or cold intolerance, polyuria, polyphagia or appetite change Derm: Denies rash, dry skin, scaling or peeling skin change Heme: Denies easy bruising, bleeding, bleeding gums Neuro: Denies headache, numbness, weakness, slurred speech, loss of memory or consciousness   Past Medical History:  She,  has a past medical history of Allergic rhinitis, Arthritis, Asthma, Chest pain (10/19/2016), CHF (congestive heart failure) ([place]), Complication of anesthesia, COPD (chronic obstructive pulmonary disease) ([place]), DVT (deep venous thrombosis) ([place]) (07/13/2018), Environmental allergies, Fibromyalgia, GERD (gastroesophageal reflux disease), Headache, Hemorrhoids, History of bronchitis, total knee arthroplasty (12/13/2015), Hyperlipidemia, Hypertension, Morbid obesity with BMI of 45.0-49.9, adult ([place]) (07/25/2015), NICM (nonischemic cardiomyopathy) ([place]), Numbness, Osteoarthritis of left knee (08/02/2015), PE (pulmonary thromboembolism) ([place]) (07/13/2018), Pes anserinus bursitis of left knee (05/18/2016), Presence of right artificial knee joint (05/18/2016), PVC (premature ventricular contraction) (06/04/2017), PVC's (premature ventricular contractions), Sleep apnea, Spinal stenosis of lumbar region (06/19/2015), Status post gastric banding, Status post total left knee replacement (08/02/2015), Temporal arteritis ([place]) (05/01/2020), Tinnitus, Unilateral primary osteoarthritis, right knee (12/13/2015), and Vertigo.    Surgical History:   Past Surgical History:  Procedure Laterality Date   ABDOMINAL HYSTERECTOMY  1979   left ovary remains   ABDOMINAL HYSTERECTOMY     ARTERY BIOPSY Left 06/07/2019   Procedure: BIOPSY TEMPORAL ARTERY;  Surgeon: Algernon Huxley, MD;  Location: [facility] ORS;  Service: Vascular;  Laterality: Left;   CHOLECYSTECTOMY     COLONOSCOPY     COLONOSCOPY N/A 10/30/2019   Procedure: COLONOSCOPY;  Surgeon: Lesly Rubenstein, MD;  Location: [facility] ENDOSCOPY;  Service: Endoscopy;  Laterality: N/A;   ESOPHAGOGASTRODUODENOSCOPY N/A 10/30/2019   Procedure: ESOPHAGOGASTRODUODENOSCOPY (EGD);  Surgeon: Lesly Rubenstein, MD;  Location[facility] ENDOSCOPY;  Service: Endoscopy;  Laterality: N/A;   fibrous tissue removed from right shoulder and back of neck      2 years ago    [place] N/A 02/28/2019   Procedure: SALIVARY GLAND BIOPSY;  Surgeon: Carloyn Manner, MD;  Location: [facility] ORS;  Service: ENT;  Laterality: N/A;   FRACTURE SURGERY     left small finger   GANGLION CYST EXCISION     GASTRIC BYPASS  1980   states had bypass with banding and band left in -   GASTRIC BYPASS OPEN     JOINT REPLACEMENT Bilateral 2017   knees   KNEE SURGERY Bilateral    both knees  2001   NECK SURGERY N/A 1205/19   ganglion cyst removal, [place]    PULMONARY THROMBECTOMY Bilateral 07/14/2018   Procedure: PULMONARY THROMBECTOMY;  Surgeon: Algernon Huxley, MD;  Location: [place] CV LAB;  Service: Cardiovascular;  Laterality: Bilateral;   PVC ABLATION N/A 06/04/2017   Procedure: PVC ABLATION;  Surgeon: Evans Lance, MD;  Location:  [place] INVASIVE CV LAB;  Service: Cardiovascular;  Laterality: N/A;   RIGHT/LEFT HEART CATH AND CORONARY ANGIOGRAPHY Bilateral 08/12/2016   Procedure: Right/Left Heart Cath and Coronary Angiography;  Surgeon: Yolonda Kida, MD;  Location: [place] CV LAB;  Service: Cardiovascular;  Laterality: Bilateral;   TONSILLECTOMY     age 64   TOTAL KNEE ARTHROPLASTY Left  08/02/2015   Procedure: LEFT TOTAL KNEE ARTHROPLASTY;  Surgeon: Mcarthur Rossetti, MD;  Location: [facility] ORS;  Service: Orthopedics;  Laterality: Left;   TOTAL KNEE ARTHROPLASTY Right 12/13/2015   Procedure: RIGHT TOTAL KNEE ARTHROPLASTY;  Surgeon: Mcarthur Rossetti, MD;  Location: [facility] ORS;  Service: Orthopedics;  Laterality: Right;     Social History:   reports that she quit smoking about 5 months ago. Her smoking use included cigarettes. She has a 12.50 pack-year smoking history. Her smokeless tobacco use includes chew. She reports that she does not drink alcohol and does not use drugs.   Family History:  Her family history includes Breast cancer in her maternal aunt; Congestive Heart Failure in her brother, daughter, sister, and son; Deep vein thrombosis in her father; Dementia in her mother; Diabetes in her sister; Hypertension in her father.   Allergies Allergies  Allergen Reactions   Hydrocodone-Acetaminophen Swelling    hands   Iodine Anaphylaxis and Swelling    Iv dye   Other Other (See Comments)    ALLERGY TO METAL - BLACKENS SKIN AND CAUSES A RASH    Tape Swelling and Other (See Comments)    Skin comes off.  Paper tape is ok tegaderm OK   Peanut Oil Other (See Comments)    ** Nuts cause runny nose   Shellfish Allergy Nausea And Vomiting and Swelling    Episode of GI infection after eating clam chowder. Still eats shrimp and other seafood     Home Medications  Prior to Admission medications   Medication Sig Start Date End Date Taking? Authorizing Provider  levocetirizine (XYZAL) 5 MG tablet Take 5 mg by mouth every evening.   Yes [provider]  nortriptyline (PAMELOR) 50 MG capsule Take 50 mg by mouth at bedtime.   Yes [provider]  nystatin cream (MYCOSTATIN) Apply 1 application topically 2 (two) times daily. (Apply under skin folds)   Yes [provider]  oxyCODONE-acetaminophen (PERCOCET) 10-325 MG tablet Take 1 tablet by mouth every 4  (four) hours as needed for pain. 01/25/21 02/24/21 Yes Molli Barrows, MD  pantoprazole (PROTONIX) 40 MG tablet Take 40 mg by mouth daily.   Yes [provider]  piperacillin-tazobactam (ZOSYN) 4.5 (4-0.5) g injection Inject 4.5 g into the vein every 8 (eight) hours. 01/10/21 02/24/21 Yes [provider]  potassium chloride SA (KLOR-CON M) 20 MEQ tablet Take 20 mEq by mouth 2 (two) times daily.   Yes [provider]  predniSONE (DELTASONE) 10 MG tablet Take 20 mg by mouth in the morning.   Yes [provider]  spironolactone (ALDACTONE) 25 MG tablet Take 25 mg by mouth daily. 09/18/20  Yes [provider]  sulfamethoxazole-trimethoprim (BACTRIM DS) 800-160 MG tablet Take 1 tablet by mouth every Monday, Wednesday, and Friday. 01/10/21  Yes [provider]  torsemide (DEMADEX) 20 MG tablet Take 2 tablets (40 mg) by mouth once daily Patient taking differently: Take 60 mg by mouth every Monday, Wednesday, and Friday. Take 2 tablets (40 mg) by mouth once daily 04/12/20  Yes Deboraha Sprang, MD  acetaminophen (TYLENOL) 325 MG  tablet Take 2 tablets (650 mg total) by mouth every 6 (six) hours as needed for mild pain or fever. 11/17/20   Cristal Deer, MD  acidophilus (RISAQUAD) CAPS capsule Take 1 capsule by mouth in the morning. Patient not taking: Reported on 01/30/2021    [provider]  albuterol (PROVENTIL HFA;VENTOLIN HFA) 108 (90 Base) MCG/ACT inhaler Inhale 2 puffs into the lungs every 6 (six) hours as needed for wheezing or shortness of breath. Patient not taking: Reported on 01/30/2021    [provider]  alendronate (FOSAMAX) 35 MG tablet Take 35 mg by mouth every Saturday. Take with a full glass of water on an empty stomach. Patient not taking: Reported on 01/30/2021    [provider]  amoxicillin-clavulanate (AUGMENTIN) 875-125 MG tablet Take 1 tablet by mouth 2 (two) times daily. Patient not taking: Reported on  01/30/2021 12/03/20   [provider]  apixaban (ELIQUIS) 5 MG TABS tablet Take 1 tablet (5 mg total) by mouth 2 (two) times daily. 07/22/18 11/25/20  Saundra Shelling, MD  ascorbic acid (VITAMIN C) 500 MG tablet Take 500 mg by mouth 2 (two) times daily. Patient not taking: Reported on 11/25/2020    [provider]  atorvastatin (LIPITOR) 40 MG tablet Take 40 mg by mouth daily. Patient not taking: Reported on 01/30/2021 11/26/20   [provider]  azelastine (ASTELIN) 0.1 % nasal spray Place 1 spray into both nostrils 2 (two) times daily. Patient not taking: Reported on 11/25/2020    [provider]  calcium carbonate (OS-CAL - DOSED IN MG OF ELEMENTAL CALCIUM) 1250 (500 Ca) MG tablet Take 2 tablets by mouth in the morning.    [provider]  diclofenac sodium (VOLTAREN) 1 % GEL Apply 2 g topically in the morning. (Apply to most painful area) Patient not taking: Reported on 11/25/2020    [provider]  diphenhydrAMINE (BENADRYL) 25 mg capsule Take 25 mg by mouth every 6 (six) hours as needed for itching or allergies.    [provider]  docusate sodium (COLACE) 100 MG capsule Take 200 mg by mouth at bedtime as needed for mild constipation.    [provider]  fluconazole (DIFLUCAN) 100 MG tablet Take 100 mg by mouth daily. Patient not taking: Reported on 01/30/2021 11/26/20   [provider]  folic acid (FOLVITE) 1 MG tablet Take 1 mg by mouth daily. Patient not taking: Reported on 01/30/2021    [provider]  gabapentin (NEURONTIN) 300 MG capsule Take 600 mg by mouth 2 (two) times daily as needed. Patient not taking: Reported on 01/30/2021 11/26/20   [provider]  gabapentin (NEURONTIN) 600 MG tablet Take 1 tablet (600 mg total) by mouth 3 (three) times daily. Patient not taking: Reported on 01/30/2021 01/15/21 02/14/21  Molli Barrows, MD  ibuprofen (ADVIL) 600 MG tablet Take 1 tablet (600 mg total)  by mouth every 6 (six) hours as needed for moderate pain (severe pain). Patient not taking: Reported on 11/25/2020 11/17/20   Cristal Deer, MD  ipratropium-albuterol (DUONEB) 0.5-2.5 (3) MG/3ML SOLN Take 3 mLs by nebulization every 6 (six) hours as needed (shortness of breath).    [provider]  magnesium oxide (MAG-OX) 400 MG tablet Take 800 mg by mouth daily. Patient not taking: Reported on 11/25/2020    [provider]  metoprolol tartrate (LOPRESSOR) 25 MG tablet Take 0.5 tablets (12.5 mg total) by mouth daily. Patient not taking: Reported on 11/25/2020 11/18/20   Kyung Bacca,  Forrest Moron, MD  montelukast (SINGULAIR) 10 MG tablet Take 10 mg by mouth at bedtime. Patient not taking: Reported on 01/30/2021    [provider]  Multiple Vitamins-Minerals (MULTIVITAMIN WITH MINERALS) tablet Take 1 tablet by mouth daily. Patient not taking: Reported on 11/25/2020    [provider]  nystatin (MYCOSTATIN) 100000 UNIT/ML suspension Take 5 mLs by mouth 4 (four) times daily. 12/03/20   [provider]  oxyCODONE-acetaminophen (PERCOCET) 10-325 MG tablet Take 1 tablet by mouth every 4 (four) hours as needed for pain. 02/24/21 03/26/21  Molli Barrows, MD  polyethylene glycol (MIRALAX / GLYCOLAX) 17 g packet Take 17 g by mouth daily as needed for moderate constipation. 11/17/20   Cristal Deer, MD  sacubitril-valsartan (ENTRESTO) 24-26 MG Take 1 tablet by mouth 2 (two) times daily. Patient not taking: Reported on 11/25/2020    [provider]  sucralfate (CARAFATE) 1 G tablet Take 1 g by mouth 2 (two) times daily before a meal. Patient not taking: Reported on 01/30/2021    [provider]  umeclidinium-vilanterol (ANORO ELLIPTA) 62.5-25 MCG/INH AEPB Inhale 1 puff into the lungs daily. Patient not taking: Reported on 01/30/2021 12/23/17   [provider]  Vitamin D, Ergocalciferol, (DRISDOL) 50000 UNITS CAPS capsule Take 50,000 Units by mouth  every Monday. Patient not taking: Reported on 01/30/2021    [provider]  Wound Cleansers (VASHE WOUND THERAPY) SOLN Apply 1 application topically daily. (Irrigate abdominal wound)    [provider]     Critical care time: 50 minutes     Darel Hong, AGACNP-BC [facility] epic messenger for cross cover needs If after hours, please call E-link

## 2021-01-31 NOTE — Assessment & Plan Note (Signed)
On steroids

## 2021-02-01 DIAGNOSIS — K5792 Diverticulitis of intestine, part unspecified, without perforation or abscess without bleeding: Secondary | ICD-10-CM

## 2021-02-01 DIAGNOSIS — A419 Sepsis, unspecified organism: Secondary | ICD-10-CM | POA: Diagnosis not present

## 2021-02-01 DIAGNOSIS — R6520 Severe sepsis without septic shock: Secondary | ICD-10-CM | POA: Diagnosis not present

## 2021-02-01 LAB — COMPREHENSIVE METABOLIC PANEL
ALT: 21 U/L (ref 0–44)
AST: 20 U/L (ref 15–41)
Albumin: 2.3 g/dL — ABNORMAL LOW (ref 3.5–5.0)
Alkaline Phosphatase: 83 U/L (ref 38–126)
Anion gap: 12 (ref 5–15)
BUN: 20 mg/dL (ref 8–23)
CO2: 25 mmol/L (ref 22–32)
Calcium: 8.9 mg/dL (ref 8.9–10.3)
Chloride: 102 mmol/L (ref 98–111)
Creatinine, Ser: 0.83 mg/dL (ref 0.44–1.00)
GFR, Estimated: >60 mL/min (ref >60)
Glucose, Bld: 111 mg/dL — ABNORMAL HIGH (ref 70–99)
Potassium: 3.5 mmol/L (ref 3.5–5.1)
Sodium: 139 mmol/L (ref 135–145)
Total Bilirubin: 0.4 mg/dL (ref 0.3–1.2)
Total Protein: 6.2 g/dL — ABNORMAL LOW (ref 6.5–8.1)

## 2021-02-01 LAB — CBC
HCT: 27.1 % — ABNORMAL LOW (ref 36.0–46.0)
Hemoglobin: 8.2 g/dL — ABNORMAL LOW (ref 12.0–15.0)
MCH: 23 pg — ABNORMAL LOW (ref 26.0–34.0)
MCHC: 30.3 g/dL (ref 30.0–36.0)
MCV: 76.1 fL — ABNORMAL LOW (ref 80.0–100.0)
Platelets: 399 10*3/uL (ref 150–400)
RBC: 3.56 MIL/uL — ABNORMAL LOW (ref 3.87–5.11)
RDW: 19.4 % — ABNORMAL HIGH (ref 11.5–15.5)
WBC: 13.8 10*3/uL — ABNORMAL HIGH (ref 4.0–10.5)
nRBC: 0 % (ref 0.0–0.2)

## 2021-02-01 LAB — URINE CULTURE: Culture: NO GROWTH

## 2021-02-01 LAB — MRSA NEXT GEN BY PCR, NASAL: MRSA by PCR Next Gen: NOT DETECTED

## 2021-02-01 MED ORDER — FUROSEMIDE 10 MG/ML IJ SOLN
20.0000 mg | Freq: Every day | INTRAMUSCULAR | Status: DC
  Administered 2021-02-01 – 2021-02-08 (×8): 20 mg via INTRAVENOUS
  Filled 2021-02-01 (×8): qty 2

## 2021-02-01 MED ORDER — PREDNISONE 20 MG PO TABS
20.0000 mg | ORAL_TABLET | Freq: Every day | ORAL | Status: DC
  Administered 2021-02-01: 20 mg via ORAL
  Filled 2021-02-01: qty 1

## 2021-02-01 MED ORDER — KETOROLAC TROMETHAMINE 30 MG/ML IJ SOLN
30.0000 mg | Freq: Once | INTRAMUSCULAR | Status: AC
  Administered 2021-02-01: 30 mg via INTRAVENOUS
  Filled 2021-02-01: qty 1

## 2021-02-01 MED ORDER — PREDNISONE 10 MG PO TABS
5.0000 mg | ORAL_TABLET | Freq: Every day | ORAL | Status: DC
  Administered 2021-02-02: 5 mg via ORAL
  Filled 2021-02-01: qty 1

## 2021-02-01 NOTE — Progress Notes (Signed)
°  Progress Note   Patient: Wendy Sanchez SFK:[id number] DOB: [birth year] DOA: 01/30/2021     2 DOS: the patient was seen and examined on 02/01/2021   Brief hospital course: 52 y f with COPD , CHF, HTN, PE on Eliquis, giant cell arteritis on prednisone, colostomy 11/2020 s/p perforated sigmoid diverticulitis, hospitalized at [facility] from 12/13 to 01/15/2021 for septic shock secondary to intra-abdominal abscesses, not drainable by IR and discharged to SNF on IV Zosyn via PICC line (12/25-2/12/23) with plans for outpatient ID follow-up, repeat CAT scan 4 weeks from discharge to determine need for longer course, who presents to the ED from SNF after she developed shaking chills and fever up to 101.5.  1/20 - ID, Intensivist & surgery seen. Removed PICC line as potential source of infection. Started Meropenem and Vanco per ID. Stress dose steroids per ICU team. Not surgical abd and likely not the source of infection per surgery 1/21: Moved out to floor.  Tolerating diet.  Blood pressure stable   Assessment and Plan * Severe sepsis ([place])- (present on admission) PICC line removed as potential source. CT abd not obvious for focal infection. Seen by surgery and ID.  Continue meropenem and vanco per ID.  Blood pressure remained stable   S/P PICC central line placement Removed on this admission as worrisome for potential source.  Chronic diastolic CHF (congestive heart failure) ([place])- (present on admission) Well compensated at this time.  Start Lasix 20 mg IV daily for now  Acute diverticulitis- (present on admission) Continue Abx as above per ID. No surgical indication.  AKI (acute kidney injury) ([place])- (present on admission) Resolved. monitor while on Lasix  Colostomy in place [facility]) Site doesn't look infected and is working. Colostomy was done in Sept'22 at [facility].  Giant cell arteritis ([place])- (present on admission) On steroids.  Chronic, continuous use of opioids- (present on admission) Continue  low-dose steroid.  I will discontinue stress dose steroid which was started on admission.  Cortisol normal  Morbid obesity with BMI of 45.0-49.9, adult ([place]) Complicates her prognosis     Subjective: Feeling better, agreeable with transfer to the floor and restart low-dose Lasix  Objective Vital signs were reviewed and unremarkable.  Constitutional:70 y morbidly obese. Not in any apparent distress HEENT: Head:Normocephalic and atraumatic.  Eyes:PERLA, EOMI, Conjunctivae are normal. Sclera is non-icteric.  Mouth/Throat:Mucous membranes are moist.  Neck:Supple with no signs of meningismus. Cardiovascular:Regular rate and rhythm. No murmurs, gallops, or rubs. 2+ symmetrical distal pulses are present . No JVD. No LE edema.  Respiratory:Respiratory effort normal .Lungs sounds clear bilaterally. No wheezes, crackles, or rhonchi.  Gastrointestinal:Soft, non tender, non distended. Positive bowel sounds.Colostomy is functional and no s/s of infection  Genitourinary:No CVA tenderness. Musculoskeletal:Nontender with normal range of motion in all extremities. No cyanosis, or erythema of extremities. Neurologic: awake and alert, non focal Skin:Skin is warm, dry. No rash or ulcers Psychiatric: Mood and affect are appropriate  Data Reviewed:  Leukocytosis, anemia  Family Communication: None at bedside  Disposition: Status is: Inpatient  Remains inpatient appropriate because: Getting treated for sepsis. Transfer to any MedSurg with off unit telemetry today  DVT prophylaxis -Eliquis   Time spent: 35 minutes  Author: Max Sane, MD 02/01/2021 3:44 PM  For on call review [email or web address].

## 2021-02-01 NOTE — Assessment & Plan Note (Signed)
PICC line removed as potential source. CT abd not obvious for focal infection. Seen by surgery and ID.  Continue meropenem and vanco per ID.  Blood pressure remained stable

## 2021-02-01 NOTE — Assessment & Plan Note (Signed)
Removed on this admission as worrisome for potential source.

## 2021-02-01 NOTE — Consult Note (Addendum)
NAME:  Wendy Sanchez, MRN:  [id number], DOB:  1950/05/16, LOS: 2 ADMISSION DATE:  01/30/2021, CONSULTATION DATE:  01/31/21 REFERRING MD:  [name], CHIEF COMPLAINT:  Fever, concern for developing septic shock   Brief Pt Description / Synopsis:  71 y.o. Female admitted with severe sepsis due to Acute Diverticulitis.  On 1/20 briefly hypotensive concerning for developing septic shock and transferred to [facility]; ultimately did not require pressors.  History of Present Illness:  Wendy Sanchez is a 71 year old female with a past medical history significant for COPD, CHF, hypertension, PE on Eliquis, giant cell arteritis on prednisone, who presents from her skilled nursing facility due to chills, rigors, and fever up to 101.5.  Patient denies chest pain, shortness of breath, cough, sputum production, nausea, vomiting, diarrhea, abdominal pain, dysuria.  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 Also she was recently hospitalized at [facility] from 12/24/2020 through 01/15/2021 for septic shock secondary to intra-abdominal abscesses which were not amendable to drainage by IR.  She was discharged to SNF on IV Zosyn via PICC line (plan antibiotic course 12/25 through 02/23/2021) with outpatient ID follow-up and repeat CT scan 4 weeks from discharge.  ED Course: Initial vital signs: Temperature 101.5 F orally, respiratory rate 20, pulse 116, blood pressure 132/67, SPO2 95% on room air Significant labs: BUN 26, creatinine 1.47, lactic acid 2.4, WBC 12.5, hemoglobin 9.7, hematocrit 32.6, MCV 77.3, MCH 23, MCHC 29.8, platelets 440, INR 1.3, PT 16 COVID-19 and influenza PCR negative Urinalysis not consistent with UTI EKG: Sinus tachycardia with nonspecific ST T wave changes Imaging: Chest x-ray>>RIGHT PICC line tip in distal SVC. Stable large cardiac silhouette. Mild central venous congestion. No focal infiltrate. No pneumothorax. CT abdomen pelvis without contrast>>IMPRESSION: 1.  Mild diverticulitis involving the mid to distal descending colon. 2. Evidence of prior gastric surgery and left lower quadrant ostomy site. 3. Small hiatal hernia. 4. Findings likely consistent with prior cholecystectomy and hysterectomy. 5. Chronic fracture deformity along the superior endplate of the O37 vertebral body. 6. Aortic atherosclerosis. Medications given: 1 L normal saline bolus, IV cefepime and Flagyl  She met sepsis criteria therefore cultures were obtained and broad-spectrum antibiotics administered along with IV fluids.  Hospitalist were asked to admit for severe sepsis due to acute diverticulitis.   Pertinent medical history:  COPD CHF HTN PE on Eliquis giant cell arteritis on prednisone colostomy 11/2020 s/p perforated sigmoid diverticulitis,   Micro Data:  01/30/2021: SARS-CoV-2 and influenza PCR>> negative 01/30/2021: Blood culture x2>> 01/30/2021: Urine>> 01/31/2021: Respiratory viral panel>> 01/31/2021: MRSA PCR>>  Antimicrobials:  Vancomycin 1/19 x 1 dose Cefepime 1/19>> Flagyl 1/19>>  Significant Hospital Events: Including procedures, antibiotic start and stop dates in addition to other pertinent events   01/30/2021: Presents to ED from SNF, to be admitted by the hospitalist 01/31/2021: Briefly hypotensive concerning for developing septic shock, PCCM consulted.  Given albumin and placed on stress dose steroids with improvement.  ID consulted.  Interim History / Subjective:  Now afebrile and normotensive. No operative intervention recommended by General Surgery. ID is following.   Objective   Blood pressure 121/71, pulse 87, temperature 98.3 F (36.8 C), resp. rate 13, height _0  (1.702 m), weight 135.9 kg, SpO2 96 %.        Intake/Output Summary (Last 24 hours) at 02/01/2021 0721 Last data filed at 02/01/2021 0400 Gross per 24 hour  Intake 3132.86 ml  Output 1400 ml  Net 1732.86 ml   [employer]  01/30/21 1615  Weight: 135.9 kg     Examination: General: Acute on chronically ill-appearing obese female, sitting in bed, on room air, no acute distress HENT: Atraumatic, normocephalic, neck supple, no JVD Lungs: Clear breath sounds bilaterally, even, nonlabored Cardiovascular: Tachycardia, regular rhythm, S1-S2, no murmurs, rubs, gallops Abdomen: Obese, soft, slightly tender throughout, no guarding or rebound tenderness, bowel sounds positive x4 Extremities: Nontender with normal range of motion in all extremities.  No cyanosis, or erythema of extremities Neuro: Awake, alert and oriented x3, follows commands, no focal deficits, speech clear GU: External catheter in place  Resolved Hospital Problem list     Assessment & Plan:   Severe Sepsis in the setting of Acute Diverticulitis Chronic Immunosuppression/chronic Prednisone -Monitor fever curve -Trend WBC's & Procalcitonin -Follow cultures as above -Antibiotics per ID -General Surgery following, appreciate input; no plans for operative intervention -PICC removed -Transition back to home dose of prednisone 20 mg daily -Engage PT  PMHx: hypertension, hyperlipidemia, DVT of the right leg & PE in 07/2018 -Continuous cardiac monitoring -Maintain MAP >65 -Gentle IV fluids if needed given prior history of HFrEF -Echocardiogram 12/26/2020: LVEF 56-38%, normal diastolic function, normal RV systolic function  She has no ICU needs at this time. PCCM will sign off.  Best Practice (right click and "Reselect all SmartList Selections" daily)   Diet/type: dysphagia diet (see orders) DVT prophylaxis: DOAC GI prophylaxis: N/A Lines: N/A Foley:  N/A Code Status:  full code Last date of multidisciplinary goals of care discussion [N/A]  Pt's family updated at bedside 1/20.  Labs   CBC: Recent Labs  Lab 01/30/21 1619 01/31/21 0607 02/01/21 0512  WBC 12.5* 15.9* 13.8*  HGB 9.7* 9.1* 8.2*  HCT 32.6* 30.6* 27.1*  MCV 77.3* 77.1* 76.1*  PLT 440* 417* 399     Basic Metabolic Panel: Recent Labs  Lab 01/30/21 1619 01/31/21 0607 02/01/21 0512  NA 137 135 139  K 3.5 3.4* 3.5  CL 95* 103 102  CO2 _0 GLUCOSE 107* 80 111*  BUN 26* 19 20  CREATININE 1.47* 0.86 0.83  CALCIUM 9.1 7.8* 8.9   GFR: Estimated Creatinine Clearance: 92.2 mL/min (by C-G formula based on SCr of 0.83 mg/dL). Recent Labs  Lab 01/30/21 1619 01/30/21 1814 01/30/21 2113 01/30/21 2302 01/31/21 0607 02/01/21 0512  PROCALCITON  --   --   --   --  0.17  --   WBC 12.5*  --   --   --  15.9* 13.8*  LATICACIDVEN 2.4* 2.7* 2.1* 1.2  --   --     Liver Function Tests: Recent Labs  Lab 01/31/21 0607 02/01/21 0512  AST 20 20  ALT 16 21  ALKPHOS 65 83  BILITOT 0.5 0.4  PROT 5.2* 6.2*  ALBUMIN 1.7* 2.3*   No results for input(s): LIPASE, AMYLASE in the last 168 hours. No results for input(s): AMMONIA in the last 168 hours.  ABG No results found for: PHART, PCO2ART, PO2ART, HCO3, TCO2, ACIDBASEDEF, O2SAT   Coagulation Profile: Recent Labs  Lab 01/30/21 1814 01/31/21 0607  INR 1.3* 1.2    Cardiac Enzymes: No results for input(s): CKTOTAL, CKMB, CKMBINDEX, TROPONINI in the last 168 hours.  HbA1C: No results found for: HGBA1C  CBG: No results for input(s): GLUCAP in the last 168 hours.  Review of Systems:   Positives in BOLD: Gen: Denies fever, chills, weight change, fatigue, night sweats HEENT: Denies blurred vision, double vision, hearing loss, tinnitus, sinus congestion, rhinorrhea, sore throat, neck  stiffness, dysphagia PULM: Denies shortness of breath, cough, sputum production, hemoptysis, wheezing CV: Denies chest pain, edema, orthopnea, paroxysmal nocturnal dyspnea, palpitations GI: Denies abdominal pain, nausea, vomiting, diarrhea, hematochezia, melena, constipation, change in bowel habits GU: Denies dysuria, hematuria, polyuria, oliguria, urethral discharge Endocrine: Denies hot or cold intolerance, polyuria, polyphagia or appetite  change Derm: Denies rash, dry skin, scaling or peeling skin change Heme: Denies easy bruising, bleeding, bleeding gums Neuro: Denies headache, numbness, weakness, slurred speech, loss of memory or consciousness   Past Medical History:  She,  has a past medical history of Allergic rhinitis, Arthritis, Asthma, Chest pain (10/19/2016), CHF (congestive heart failure) ([place]), Complication of anesthesia, COPD (chronic obstructive pulmonary disease) ([place]), DVT (deep venous thrombosis) ([place]) (07/13/2018), Environmental allergies, Fibromyalgia, GERD (gastroesophageal reflux disease), Headache, Hemorrhoids, History of bronchitis, total knee arthroplasty (12/13/2015), Hyperlipidemia, Hypertension, Morbid obesity with BMI of 45.0-49.9, adult ([place]) (07/25/2015), NICM (nonischemic cardiomyopathy) ([place]), Numbness, Osteoarthritis of left knee (08/02/2015), PE (pulmonary thromboembolism) ([place]) (07/13/2018), Pes anserinus bursitis of left knee (05/18/2016), Presence of right artificial knee joint (05/18/2016), PVC (premature ventricular contraction) (06/04/2017), PVC's (premature ventricular contractions), Sleep apnea, Spinal stenosis of lumbar region (06/19/2015), Status post gastric banding, 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   Surgical History:   Past Surgical History:  Procedure Laterality Date   ABDOMINAL HYSTERECTOMY  1979   left ovary remains   ABDOMINAL HYSTERECTOMY     ARTERY BIOPSY Left 06/07/2019   Procedure: BIOPSY TEMPORAL ARTERY;  Surgeon: Algernon Huxley, MD;  Location: [facility];  Service: Vascular;  Laterality: Left;   CHOLECYSTECTOMY     COLONOSCOPY     COLONOSCOPY N/A 10/30/2019   Procedure: COLONOSCOPY;  Surgeon: Lesly Rubenstein, MD;  Location: [facility];  Service: Endoscopy;  Laterality: N/A;   ESOPHAGOGASTRODUODENOSCOPY N/A 10/30/2019   Procedure:  ESOPHAGOGASTRODUODENOSCOPY (EGD);  Surgeon: Lesly Rubenstein, MD;  Location: [facility];  Service: Endoscopy;  Laterality: N/A;   fibrous tissue removed from right shoulder and back of neck      2 years ago    [place] N/A 02/28/2019   Procedure: SALIVARY GLAND BIOPSY;  Surgeon: Carloyn Manner, MD;  Location: [facility];  Service: ENT;  Laterality: N/A;   FRACTURE SURGERY     left small finger   GANGLION CYST EXCISION     GASTRIC BYPASS  1980   states had bypass with banding and band left in -   GASTRIC BYPASS OPEN     JOINT REPLACEMENT Bilateral 2017   knees   KNEE SURGERY Bilateral    both knees  2001   NECK SURGERY N/A 1205/19   ganglion cyst removal, [place]    PULMONARY THROMBECTOMY Bilateral 07/14/2018   Procedure: PULMONARY THROMBECTOMY;  Surgeon: Algernon Huxley, MD;  Location: [facility];  Service: Cardiovascular;  Laterality: Bilateral;   PVC ABLATION N/A 06/04/2017   Procedure: PVC ABLATION;  Surgeon: Evans Lance, MD;  Location: [place] [facility];  Service: Cardiovascular;  Laterality: N/A;   RIGHT/LEFT HEART CATH AND CORONARY ANGIOGRAPHY Bilateral 08/12/2016   Procedure: Right/Left Heart Cath and Coronary Angiography;  Surgeon: Yolonda Kida, MD;  Location: [place] [facility];  Service: Cardiovascular;  Laterality: Bilateral;   TONSILLECTOMY     age 89   TOTAL KNEE ARTHROPLASTY Left 08/02/2015   Procedure: LEFT TOTAL KNEE ARTHROPLASTY;  Surgeon: Mcarthur Rossetti, MD;  Location: [facility];  Service:  Orthopedics;  Laterality: Left;   TOTAL KNEE ARTHROPLASTY Right 12/13/2015   Procedure: RIGHT TOTAL KNEE ARTHROPLASTY;  Surgeon: Mcarthur Rossetti, MD;  Location: [facility];  Service: Orthopedics;  Laterality: Right;     Social History:   reports that she quit smoking about 5 months ago. Her smoking use included cigarettes. She has a 12.50 pack-year smoking history. Her smokeless tobacco use includes chew. She reports that she does not drink  alcohol and does not use drugs.   Family History:  Her family history includes Breast cancer in her maternal aunt; Congestive Heart Failure in her brother, daughter, sister, and son; Deep vein thrombosis in her father; Dementia in her mother; Diabetes in her sister; Hypertension in her father.   Allergies Allergies  Allergen Reactions   Hydrocodone-Acetaminophen Swelling    hands   Iodine Anaphylaxis and Swelling    Iv dye   Other Other (See Comments)    ALLERGY TO METAL - BLACKENS SKIN AND CAUSES A RASH    Tape Swelling and Other (See Comments)    Skin comes off.  Paper tape is ok tegaderm OK   Peanut Oil Other (See Comments)    ** Nuts cause runny nose   Shellfish Allergy Nausea And Vomiting and Swelling    Episode of GI infection after eating clam chowder. Still eats shrimp and other seafood     Home Medications  Prior to Admission medications   Medication Sig Start Date End Date Taking? Authorizing Provider  levocetirizine (XYZAL) 5 MG tablet Take 5 mg by mouth every evening.   Yes [provider]  nortriptyline (PAMELOR) 50 MG capsule Take 50 mg by mouth at bedtime.   Yes [provider]  nystatin cream (MYCOSTATIN) Apply 1 application topically 2 (two) times daily. (Apply under skin folds)   Yes [provider]  oxyCODONE-acetaminophen (PERCOCET) 10-325 MG tablet Take 1 tablet by mouth every 4 (four) hours as needed for pain. 01/25/21 02/24/21 Yes Molli Barrows, MD  pantoprazole (PROTONIX) 40 MG tablet Take 40 mg by mouth daily.   Yes [provider]  piperacillin-tazobactam (ZOSYN) 4.5 (4-0.5) g injection Inject 4.5 g into the vein every 8 (eight) hours. 01/10/21 02/24/21 Yes [provider]  potassium chloride SA (KLOR-CON M) 20 MEQ tablet Take 20 mEq by mouth 2 (two) times daily.   Yes [provider]  predniSONE (DELTASONE) 10 MG tablet Take 20 mg by mouth in the morning.   Yes [provider]  spironolactone  (ALDACTONE) 25 MG tablet Take 25 mg by mouth daily. 09/18/20  Yes [provider]  sulfamethoxazole-trimethoprim (BACTRIM DS) 800-160 MG tablet Take 1 tablet by mouth every Monday, Wednesday, and Friday. 01/10/21  Yes [provider]  torsemide (DEMADEX) 20 MG tablet Take 2 tablets (40 mg) by mouth once daily Patient taking differently: Take 60 mg by mouth every Monday, Wednesday, and Friday. Take 2 tablets (40 mg) by mouth once daily 04/12/20  Yes Deboraha Sprang, MD  acetaminophen (TYLENOL) 325 MG tablet Take 2 tablets (650 mg total) by mouth every 6 (six) hours as needed for mild pain or fever. 11/17/20   Cristal Deer, MD  acidophilus (RISAQUAD) CAPS capsule Take 1 capsule by mouth in the morning. Patient not taking: Reported on 01/30/2021    [provider]  albuterol (PROVENTIL HFA;VENTOLIN HFA) 108 (90 Base) MCG/ACT inhaler Inhale 2 puffs into the lungs every 6 (six) hours as needed for wheezing or shortness of breath. Patient not taking:  Reported on 01/30/2021    [provider]  alendronate (FOSAMAX) 35 MG tablet Take 35 mg by mouth every Saturday. Take with a full glass of water on an empty stomach. Patient not taking: Reported on 01/30/2021    [provider]  amoxicillin-clavulanate (AUGMENTIN) 875-125 MG tablet Take 1 tablet by mouth 2 (two) times daily. Patient not taking: Reported on 01/30/2021 12/03/20   [provider]  apixaban (ELIQUIS) 5 MG TABS tablet Take 1 tablet (5 mg total) by mouth 2 (two) times daily. 07/22/18 11/25/20  Saundra Shelling, MD  ascorbic acid (VITAMIN C) 500 MG tablet Take 500 mg by mouth 2 (two) times daily. Patient not taking: Reported on 11/25/2020    [provider]  atorvastatin (LIPITOR) 40 MG tablet Take 40 mg by mouth daily. Patient not taking: Reported on 01/30/2021 11/26/20   [provider]  azelastine (ASTELIN) 0.1 % nasal spray Place 1 spray into both nostrils 2 (two) times  daily. Patient not taking: Reported on 11/25/2020    [provider]  calcium carbonate (OS-CAL - DOSED IN MG OF ELEMENTAL CALCIUM) 1250 (500 Ca) MG tablet Take 2 tablets by mouth in the morning.    [provider]  diclofenac sodium (VOLTAREN) 1 % GEL Apply 2 g topically in the morning. (Apply to most painful area) Patient not taking: Reported on 11/25/2020    [provider]  diphenhydrAMINE (BENADRYL) 25 mg capsule Take 25 mg by mouth every 6 (six) hours as needed for itching or allergies.    [provider]  docusate sodium (COLACE) 100 MG capsule Take 200 mg by mouth at bedtime as needed for mild constipation.    [provider]  fluconazole (DIFLUCAN) 100 MG tablet Take 100 mg by mouth daily. Patient not taking: Reported on 01/30/2021 11/26/20   [provider]  folic acid (FOLVITE) 1 MG tablet Take 1 mg by mouth daily. Patient not taking: Reported on 01/30/2021    [provider]  gabapentin (NEURONTIN) 300 MG capsule Take 600 mg by mouth 2 (two) times daily as needed. Patient not taking: Reported on 01/30/2021 11/26/20   [provider]  gabapentin (NEURONTIN) 600 MG tablet Take 1 tablet (600 mg total) by mouth 3 (three) times daily. Patient not taking: Reported on 01/30/2021 01/15/21 02/14/21  Molli Barrows, MD  ibuprofen (ADVIL) 600 MG tablet Take 1 tablet (600 mg total) by mouth every 6 (six) hours as needed for moderate pain (severe pain). Patient not taking: Reported on 11/25/2020 11/17/20   Cristal Deer, MD  ipratropium-albuterol (DUONEB) 0.5-2.5 (3) MG/3ML SOLN Take 3 mLs by nebulization every 6 (six) hours as needed (shortness of breath).    [provider]  magnesium oxide (MAG-OX) 400 MG tablet Take 800 mg by mouth daily. Patient not taking: Reported on 11/25/2020    [provider]  metoprolol tartrate (LOPRESSOR) 25 MG tablet Take 0.5 tablets (12.5 mg total) by mouth daily. Patient not  taking: Reported on 11/25/2020 11/18/20   Cristal Deer, MD  montelukast (SINGULAIR) 10 MG tablet Take 10 mg by mouth at bedtime. Patient not taking: Reported on 01/30/2021    [provider]  Multiple Vitamins-Minerals (MULTIVITAMIN WITH MINERALS) tablet Take 1 tablet by mouth daily. Patient not taking: Reported on 11/25/2020    [provider]  nystatin (MYCOSTATIN) 100000 UNIT/ML suspension Take 5 mLs by mouth 4 (four) times daily. 12/03/20   [provider]  oxyCODONE-acetaminophen (PERCOCET) 10-325 MG tablet Take 1 tablet  by mouth every 4 (four) hours as needed for pain. 02/24/21 03/26/21  Molli Barrows, MD  polyethylene glycol (MIRALAX / GLYCOLAX) 17 g packet Take 17 g by mouth daily as needed for moderate constipation. 11/17/20   Cristal Deer, MD  sacubitril-valsartan (ENTRESTO) 24-26 MG Take 1 tablet by mouth 2 (two) times daily. Patient not taking: Reported on 11/25/2020    [provider]  sucralfate (CARAFATE) 1 G tablet Take 1 g by mouth 2 (two) times daily before a meal. Patient not taking: Reported on 01/30/2021    [provider]  umeclidinium-vilanterol (ANORO ELLIPTA) 62.5-25 MCG/INH AEPB Inhale 1 puff into the lungs daily. Patient not taking: Reported on 01/30/2021 12/23/17   [provider]  Vitamin D, Ergocalciferol, (DRISDOL) 50000 UNITS CAPS capsule Take 50,000 Units by mouth every Monday. Patient not taking: Reported on 01/30/2021    [provider]  Wound Cleansers (VASHE WOUND THERAPY) SOLN Apply 1 application topically daily. (Irrigate abdominal wound)    [provider]    Bennie Pierini, MD 02/01/21 7:22 AM   [facility] Prefer epic messenger for cross cover needs If after hours, please call E-link

## 2021-02-01 NOTE — Assessment & Plan Note (Signed)
Resolved. monitor while on Lasix

## 2021-02-01 NOTE — Evaluation (Signed)
Physical Therapy Evaluation Patient Details Name: Wendy Sanchez MRN: [id number] DOB: 24-Jun-1950 Today's Date: 02/01/2021  History of Present Illness  Pt is a 71 y/o F admitted on 01/30/21 with severe sepsis 2/2 acute diverticulitis. On 01/31/21 pt briefly developed hypotension concerning for developing septic shock & transferred to stepdown; ulimately did not require pressors. 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 PMH: COPD, CHF, HTN, PE on eliquis, giant cell arteritis on prednisone  Clinical Impression  Pt seen for PT evaluation with pt agreeable to tx. Pt performs bed mobility well, except for sit>supine where pt requires mod assist to elevate BLE onto bed. Standing deferred at this time 2/2 pt c/o "wooziness" with sitting EOB. Once supine, pt performs BLE strengthening exercises & left sitting in chair position to increase upright tolerance. Pt motivated to participate to return to independent PLOF. Will continue to follow pt acutely to progress mobility as able.    BP checked in R wrist BP sitting EOB: 126/85 mmHg (MAP 97) BP upon return supine: 110/69 mmHg (MAP 83) BP at end of session: 124/77 mmHg (MAP 91) SpO2 >90%, pt states "I need to remember to breathe" during session as she gets SOB at times  Pt noted to have tenderness to posterior L knee & MD notified. Pt also with RLE edema>LLE & MD notified as well.      Recommendations for follow up therapy are one component of a multi-disciplinary discharge planning process, led by the attending physician.  Recommendations may be updated based on patient status, additional functional criteria and insurance authorization.  Follow Up Recommendations Skilled nursing-short term rehab (<3 hours/day)    Assistance Recommended at Discharge Frequent or constant  Supervision/Assistance  Patient can return home with the following  Two people to help with walking and/or transfers;Two people to help with bathing/dressing/bathroom;Direct supervision/assist for medications management;Help with stairs or ramp for entrance;Assist for transportation;Assistance with cooking/housework;Direct supervision/assist for financial management    Equipment Recommendations None recommended by PT  Recommendations for Other Services       Functional Status Assessment Patient has had a recent decline in their functional status and demonstrates the ability to make significant improvements in function in a reasonable and predictable amount of time.     Precautions / Restrictions Precautions Precautions: Fall Restrictions Weight Bearing Restrictions: No      Mobility  Bed Mobility Overal bed mobility: Needs Assistance Bed Mobility: Supine to Sit, Sit to Supine     Supine to sit: Supervision, HOB elevated Sit to supine: Mod assist, HOB elevated (assistance to elevate BLE onto bed)   General bed mobility comments: use of bed rails; pt is able to scoot to [facility] with bed flat, with use of rails    Transfers                        Ambulation/Gait                  Stairs            Wheelchair Mobility    Modified Rankin (Stroke Patients Only)       Balance Overall balance assessment: Needs assistance Sitting-balance support: Feet supported, Bilateral upper extremity supported Sitting balance-Leahy Scale: Fair Sitting balance - Comments: supervision<>CGA static sitting  Pertinent Vitals/Pain Pt c/o tenderness to posterior L knee & MD notified.    Home Living Family/patient expects to be discharged to:: Skilled nursing facility                        Prior Function               Mobility Comments: Pt reports she was ambulating "some" with therapy at SNF. Prior to  initial hospitalization months ago pt was independent, driving, living alone.       Hand Dominance        Extremity/Trunk Assessment   Upper Extremity Assessment Upper Extremity Assessment: Generalized weakness    Lower Extremity Assessment Lower Extremity Assessment: Generalized weakness (2/5 knee extension BLE)       Communication   Communication: No difficulties  Cognition Arousal/Alertness: Awake/alert Behavior During Therapy: WFL for tasks assessed/performed Overall Cognitive Status: Within Functional Limits for tasks assessed                                 General Comments: Pleasant lady, motivated to participate & get better.        General Comments      Exercises General Exercises - Lower Extremity Ankle Circles/Pumps: AROM, Strengthening, Both, 10 reps, Supine Gluteal Sets: Strengthening, Both, 10 reps Long Arc Quad: AROM, Strengthening, Both, 10 reps Heel Slides: AROM, Strengthening, Both, 10 reps, Supine (poor technique, difficulty maintaining neutral hip alignment throughout) Hip ABduction/ADduction: Strengthening, Both, 10 reps, AROM, Supine   Assessment/Plan    PT Assessment Patient needs continued PT services  PT Problem List Decreased strength;Decreased mobility;Decreased range of motion;Decreased balance;Decreased activity tolerance;Cardiopulmonary status limiting activity       PT Treatment Interventions DME instruction;Therapeutic exercise;Gait training;Balance training;Stair training;Neuromuscular re-education;Functional mobility training;Modalities;Manual techniques;Patient/family education;Therapeutic activities    PT Goals (Current goals can be found in the Care Plan section)  Acute Rehab PT Goals Patient Stated Goal: get better PT Goal Formulation: With patient Time For Goal Achievement: 02/15/21 Potential to Achieve Goals: Good    Frequency Min 2X/week     Co-evaluation               AM-PAC PT "6 Clicks"  Mobility  Outcome Measure Help needed turning from your back to your side while in a flat bed without using bedrails?: A Little Help needed moving from lying on your back to sitting on the side of a flat bed without using bedrails?: A Little Help needed moving to and from a bed to a chair (including a wheelchair)?: A Lot Help needed standing up from a chair using your arms (e.g., wheelchair or bedside chair)?: Total Help needed to walk in hospital room?: Total Help needed climbing 3-5 steps with a railing? : Total 6 Click Score: 11    End of Session   Activity Tolerance: Patient limited by fatigue (OOB limited by c/o dizziness) Patient left: in bed;with bed alarm set;with call bell/phone within reach (in chair position) Nurse Communication: Mobility status (BP) PT Visit Diagnosis: Unsteadiness on feet (R26.81);Muscle weakness (generalized) (M62.81);Difficulty in walking, not elsewhere classified (R26.2)    Time: 2706-2376 PT Time Calculation (min) (ACUTE ONLY): 23 min   Charges:   PT Evaluation $PT Eval Moderate Complexity: 1 Mod PT Treatments $Therapeutic Exercise: 8-22 mins        Lavone Nian, PT, DPT 02/01/21, 1:33 PM   Waunita Schooner 02/01/2021, 1:30 PM

## 2021-02-01 NOTE — Assessment & Plan Note (Signed)
Complicates her prognosis

## 2021-02-01 NOTE — Progress Notes (Signed)
Report called to RN on 1C.  All questions addressed and POC will continue.Wendy Sanchez

## 2021-02-01 NOTE — Assessment & Plan Note (Signed)
Well compensated at this time.  Start Lasix 20 mg IV daily for now

## 2021-02-01 NOTE — Progress Notes (Addendum)
CC: Sepsis Subjective: Patient currently without any specific complaints.  Ostomy is working well.  She is tolerating regular diet.  I have personally reviewed the CT scan some chronic changes on descending: That are nonspecific and would not necessarily call this diverticulitis.  There is no evidence of an abscess  Objective: Vital signs in last 24 hours: Temp:  [97.6 F (36.4 C)-98.5 F (36.9 C)] 97.6 F (36.4 C) (01/21 1409) Pulse Rate:  [78-95] 89 (01/21 1409) Resp:  [13-41] 18 (01/21 1409) BP: (100-121)/(60-78) 114/72 (01/21 1409) SpO2:  [92 %-100 %] 100 % (01/21 1409) Last BM Date: 02/01/21  Intake/Output from previous day: 01/20 0701 - 01/21 0700 In: 3132.9 [I.V.:2196.2; IV Piggyback:936.7] Out: 1400 [Urine:1200; Stool:200] Intake/Output this shift: No intake/output data recorded.  Physical exam: NAD alert Abd: soft, nt, prior laparotomy scar, no infection, colostomy pink and patent.  Lab Results: CBC  Recent Labs    01/31/21 0607 02/01/21 0512  WBC 15.9* 13.8*  HGB 9.1* 8.2*  HCT 30.6* 27.1*  PLT 417* 399   BMET Recent Labs    01/31/21 0607 02/01/21 0512  NA 135 139  K 3.4* 3.5  CL 103 102  CO2 23 25  GLUCOSE 80 111*  BUN 19 20  CREATININE 0.86 0.83  CALCIUM 7.8* 8.9   PT/INR Recent Labs    01/30/21 1814 01/31/21 0607  LABPROT 16.0* 14.8  INR 1.3* 1.2   ABG No results for input(s): PHART, HCO3 in the last 72 hours.  Invalid input(s): PCO2, PO2  Studies/Results: CT ABDOMEN PELVIS WO CONTRAST  Result Date: 01/30/2021 CLINICAL DATA:  Shivering and headache. On antibiotics for diverticulitis. EXAM: CT ABDOMEN AND PELVIS WITHOUT CONTRAST TECHNIQUE: Multidetector CT imaging of the abdomen and pelvis was performed following the standard protocol without IV contrast. RADIATION DOSE REDUCTION: This exam was performed according to the departmental dose-optimization program which includes automated exposure control, adjustment of the mA and/or kV  according to patient size and/or use of iterative reconstruction technique. COMPARISON:  July 31, 2013 FINDINGS: Lower chest: Mild atelectasis is seen within the posterior aspects of the bilateral lung bases. Hepatobiliary: Punctate calcified granulomas are seen scattered throughout the liver parenchyma. Status post cholecystectomy. No biliary dilatation. Pancreas: Unremarkable. No pancreatic ductal dilatation or surrounding inflammatory changes. Spleen: Normal in size without focal abnormality. Adrenals/Urinary Tract: Adrenal glands are unremarkable. Kidneys are normal in size, without renal calculi or hydronephrosis. 5 mm and 10 mm hyperdense foci are seen within the right kidney. A similar appearing 7 mm hyperdense focus is noted within the posterolateral aspect of the mid to lower left kidney. Bladder is unremarkable. Stomach/Bowel: There is a very small hiatal hernia. Surgical sutures are noted within the gastric region. Appendix appears normal. No evidence of bowel dilatation. Mildly inflamed diverticula are seen within the mid to distal descending colon. There is no evidence of associated perforation or abscess. Noninflamed diverticula are seen throughout the remainder of the large bowel. Vascular/Lymphatic: Aortic atherosclerosis. No enlarged abdominal or pelvic lymph nodes. Reproductive: Status post hysterectomy. No adnexal masses. Other: A left lower quadrant ostomy site is present. No abdominopelvic ascites. Musculoskeletal: Multilevel degenerative changes are seen throughout the lumbar spine with a chronic fracture deformity seen along the superior endplate of the T55 vertebral body. IMPRESSION: 1. Mild diverticulitis involving the mid to distal descending colon. 2. Evidence of prior gastric surgery and left lower quadrant ostomy site. 3. Small hiatal hernia. 4. Findings likely consistent with prior cholecystectomy and hysterectomy. 5. Chronic fracture deformity along  the superior endplate of the J28  vertebral body. 6. Aortic atherosclerosis. Aortic Atherosclerosis (ICD10-I70.0). Electronically Signed   By: Virgina Norfolk M.D.   On: 01/30/2021 22:08   DG Chest 2 View  Result Date: 01/30/2021 CLINICAL DATA:  Best images obtainable due to patients condition. [facility] ch[facility] and generalized bodyaches. Pt alert and oriented. NAD noted Pt currently on antibiotics for "infection"infection EXAM: CHEST - 2 VIEW COMPARISON:  None. FINDINGS: RIGHT PICC line tip in distal SVC. Stable large cardiac silhouette. Mild central venous congestion. No focal infiltrate. No pneumothorax. IMPRESSION: Central venous congestion.  Low lung volumes. Electronically Signed   By: Suzy Bouchard M.D.   On: 01/30/2021 18:22    Anti-infectives: Anti-infectives (From admission, onward)    Start     Dose/Rate Route Frequency Ordered Stop   02/01/21 0300  meropenem (MERREM) 1 g in sodium chloride 0.9 % 100 mL IVPB        1 g 200 mL/hr over 30 Minutes Intravenous Every 8 hours 01/31/21 2018     01/31/21 2100  vancomycin (VANCOREADY) IVPB 2000 mg/400 mL        2,000 mg 200 mL/hr over 120 Minutes Intravenous Every 24 hours 01/31/21 2003     01/31/21 0230  ceFEPIme (MAXIPIME) 2 g in sodium chloride 0.9 % 100 mL IVPB        2 g 200 mL/hr over 30 Minutes Intravenous Every 8 hours 01/31/21 0012 01/31/21 1828   01/30/21 2015  vancomycin (VANCOCIN) IVPB 1000 mg/200 mL premix  Status:  Discontinued       See Hyperspace for full Linked Orders Report.   1,000 mg 200 mL/hr over 60 Minutes Intravenous  Once 01/30/21 1812 01/30/21 1813   01/30/21 1815  vancomycin (VANCOREADY) IVPB 2000 mg/400 mL       See Hyperspace for full Linked Orders Report.   2,000 mg 200 mL/hr over 120 Minutes Intravenous  Once 01/30/21 1812 01/31/21 0404   01/30/21 1800  ceFEPIme (MAXIPIME) 2 g in sodium chloride 0.9 % 100 mL IVPB        2 g 200 mL/hr over 30 Minutes Intravenous  Once 01/30/21 1752 01/30/21 1851   01/30/21  1800  metroNIDAZOLE (FLAGYL) IVPB 500 mg        500 mg 100 mL/hr over 60 Minutes Intravenous  Once 01/30/21 1752 01/30/21 2111   01/30/21 1800  vancomycin (VANCOCIN) IVPB 1000 mg/200 mL premix  Status:  Discontinued        1,000 mg 200 mL/hr over 60 Minutes Intravenous  Once 01/30/21 1752 01/30/21 1810   01/30/21 0700  metroNIDAZOLE (FLAGYL) IVPB 500 mg  Status:  Discontinued        500 mg 100 mL/hr over 60 Minutes Intravenous Every 12 hours 01/30/21 2329 01/31/21 2017       Assessment/Plan: 71 year-old female status post Hartman's 4 months ago at [facility] Admitted for sepsis likely from PICC line.  At this time there is no evidence of wound infection and there is no evidence of diverticular abscess. CT scan call is very soft and I agree with Dr. Hampton Abbot that this should not cause severe sepsis.  No need for surgical intervention at this time. We will be available this time   Caroleen Hamman, MD, [facility]  02/01/2021

## 2021-02-01 NOTE — Assessment & Plan Note (Signed)
Continue Abx as above per ID. No surgical indication.

## 2021-02-01 NOTE — Assessment & Plan Note (Signed)
Site doesn't look infected and is working. Colostomy was done in Sept'22 at [facility].

## 2021-02-01 NOTE — Assessment & Plan Note (Signed)
On steroids.

## 2021-02-01 NOTE — Assessment & Plan Note (Signed)
Continue low-dose steroid.  I will discontinue stress dose steroid which was started on admission.  Cortisol normal

## 2021-02-02 LAB — CBC
HCT: 27.1 % — ABNORMAL LOW (ref 36.0–46.0)
Hemoglobin: 8.1 g/dL — ABNORMAL LOW (ref 12.0–15.0)
MCH: 22.5 pg — ABNORMAL LOW (ref 26.0–34.0)
MCHC: 29.9 g/dL — ABNORMAL LOW (ref 30.0–36.0)
MCV: 75.3 fL — ABNORMAL LOW (ref 80.0–100.0)
Platelets: 450 10*3/uL — ABNORMAL HIGH (ref 150–400)
RBC: 3.6 MIL/uL — ABNORMAL LOW (ref 3.87–5.11)
RDW: 19.4 % — ABNORMAL HIGH (ref 11.5–15.5)
WBC: 9.5 10*3/uL (ref 4.0–10.5)
nRBC: 0 % (ref 0.0–0.2)

## 2021-02-02 LAB — BASIC METABOLIC PANEL
Anion gap: 10 (ref 5–15)
BUN: 24 mg/dL — ABNORMAL HIGH (ref 8–23)
CO2: 26 mmol/L (ref 22–32)
Calcium: 9 mg/dL (ref 8.9–10.3)
Chloride: 105 mmol/L (ref 98–111)
Creatinine, Ser: 0.96 mg/dL (ref 0.44–1.00)
GFR, Estimated: >60 mL/min (ref >60)
Glucose, Bld: 94 mg/dL (ref 70–99)
Potassium: 3.1 mmol/L — ABNORMAL LOW (ref 3.5–5.1)
Sodium: 141 mmol/L (ref 135–145)

## 2021-02-02 MED ORDER — GABAPENTIN 100 MG PO CAPS
200.0000 mg | ORAL_CAPSULE | Freq: Three times a day (TID) | ORAL | Status: DC
  Administered 2021-02-02 – 2021-02-08 (×19): 200 mg via ORAL
  Filled 2021-02-02 (×19): qty 2

## 2021-02-02 MED ORDER — PREDNISONE 20 MG PO TABS
20.0000 mg | ORAL_TABLET | Freq: Every day | ORAL | Status: DC
  Administered 2021-02-03 – 2021-02-08 (×6): 20 mg via ORAL
  Filled 2021-02-02 (×6): qty 1

## 2021-02-02 MED ORDER — POTASSIUM CHLORIDE CRYS ER 20 MEQ PO TBCR
40.0000 meq | EXTENDED_RELEASE_TABLET | Freq: Once | ORAL | Status: AC
  Administered 2021-02-02: 40 meq via ORAL
  Filled 2021-02-02: qty 2

## 2021-02-02 MED ORDER — OXYCODONE-ACETAMINOPHEN 5-325 MG PO TABS
2.0000 | ORAL_TABLET | ORAL | Status: DC | PRN
  Administered 2021-02-02 – 2021-02-08 (×19): 2 via ORAL
  Filled 2021-02-02 (×19): qty 2

## 2021-02-02 NOTE — Assessment & Plan Note (Signed)
Continue Abx as above per ID. No surgical indication.

## 2021-02-02 NOTE — Assessment & Plan Note (Signed)
PICC line removed as potential source. CT abd not obvious for focal infection. Seen by surgery and ID.  Continue meropenem and vanco per ID.  Blood pressure remained stable.  Started Lasix 20 mg once daily on 1/21

## 2021-02-02 NOTE — Assessment & Plan Note (Signed)
On steroids.

## 2021-02-02 NOTE — Assessment & Plan Note (Signed)
Continue Percocet per her home regimen dose

## 2021-02-02 NOTE — Assessment & Plan Note (Signed)
On prednisone 20 mg once daily chronically

## 2021-02-02 NOTE — Progress Notes (Signed)
Progress Note   Patient: Wendy Sanchez UYQ:[id number] DOB: 11/07/1950 DOA: 01/30/2021     3 DOS: the patient was seen and examined on 02/02/2021   Brief hospital course: 3 y f with COPD , CHF, HTN, PE on Eliquis, giant cell arteritis on prednisone, colostomy 11/2020 s/p perforated sigmoid diverticulitis, hospitalized at [facility] from 12/13 to 01/15/2021 for septic shock secondary to intra-abdominal abscesses, not drainable by IR and discharged to SNF on IV Zosyn via PICC line (12/25-2/12/23) with plans for outpatient ID follow-up, repeat CAT scan 4 weeks from discharge to determine need for longer course, who presents to the ED from SNF after she developed shaking chills and fever up to 101.5.  1/20 - ID, Intensivist & surgery seen. Removed PICC line as potential source of infection. Started Meropenem and Vanco per ID. Stress dose steroids per ICU team. Not surgical abd and likely not the source of infection per surgery 1/21: Moved out to floor.  Tolerating diet.  Blood pressure stable 1/22: Increased prednisone to 20 mg as per home dose.  Also restarted Percocet and gabapentin per her home dose.  Remains afebrile and blood pressure controlled   Assessment and Plan * Severe sepsis ([place])- (present on admission) PICC line removed as potential source. CT abd not obvious for focal infection. Seen by surgery and ID.  Continue meropenem and vanco per ID.  Blood pressure remained stable.  Started Lasix 20 mg once daily on 1/21   S/P PICC central line placement Removed on this admission as worrisome for potential source.  Await tip culture  Compression fracture of T2 vertebra (HCC)- (present on admission) Continue Percocet per her home regimen dose  Chronic diastolic CHF (congestive heart failure) ([place])- (present on admission) Well compensated at this time.  Continue Lasix 20 mg IV daily for now  Acute diverticulitis- (present on admission) Continue Abx as above per ID. No surgical indication.  AKI  (acute kidney injury) ([place])- (present on admission) Resolved. monitor while on Lasix  Colostomy in place [facility]) Site doesn't look infected and is working. Colostomy was done in Sept'22 at [facility].  Giant cell arteritis ([place])- (present on admission) On steroids.  Chronic, continuous use of opioids- (present on admission) On prednisone 20 mg once daily chronically  Morbid obesity with BMI of 45.0-49.9, adult ([place]) Complicates her prognosis.   Subjective: Requesting to restart her gabapentin, Percocet and increase dose of prednisone to manage with her home dose.  Objective Vital signs were reviewed and unremarkable.  Constitutional:70 y morbidly obese.Not in any apparent distress HEENT: Head:Normocephalic and atraumatic.  Eyes:PERLA, EOMI, Conjunctivae are normal. Sclera is non-icteric.  Mouth/Throat:Mucous membranes are moist.  Neck:Supple with no signs of meningismus. Cardiovascular:Regular rate and rhythm. No murmurs, gallops, or rubs. 2+ symmetrical distal pulses are present . No JVD. No LE edema.  Respiratory:Respiratory effort normal .Lungs sounds clear bilaterally. No wheezes, crackles, or rhonchi.  Gastrointestinal:Soft, non tender, non distended. Positive bowel sounds.Colostomyis functional and no s/s of infection Genitourinary:No CVA tenderness. Musculoskeletal:Nontender with normal range of motion in all extremities. No cyanosis, or erythema of extremities. Neurologic:awake and alert, non focal Skin:Skin is warm, dry. No rash or ulcers Psychiatric: Mood and affect are appropriate  Data Reviewed:  Low potassium, hemoglobin  Family Communication: Updated Sister over phone  Disposition: Status is: Inpatient  Remains inpatient appropriate because: Getting treated for sepsis   DVT prophylaxis-Eliquis   Time spent: 35 minutes  Author: Max Sane, MD 02/02/2021 12:39 PM  For on call review [email or web address].

## 2021-02-02 NOTE — Assessment & Plan Note (Signed)
Resolved. monitor while on Lasix

## 2021-02-02 NOTE — Assessment & Plan Note (Signed)
Complicates her prognosis.

## 2021-02-02 NOTE — Assessment & Plan Note (Signed)
Site doesn't look infected and is working. Colostomy was done in Sept'22 at [facility].

## 2021-02-02 NOTE — Assessment & Plan Note (Addendum)
Removed on this admission as worrisome for potential source.  Await tip culture

## 2021-02-02 NOTE — Assessment & Plan Note (Signed)
Well compensated at this time.  Continue Lasix 20 mg IV daily for now

## 2021-02-03 DIAGNOSIS — R6510 Systemic inflammatory response syndrome (SIRS) of non-infectious origin without acute organ dysfunction: Secondary | ICD-10-CM | POA: Diagnosis not present

## 2021-02-03 DIAGNOSIS — I5030 Unspecified diastolic (congestive) heart failure: Secondary | ICD-10-CM | POA: Diagnosis not present

## 2021-02-03 DIAGNOSIS — K5792 Diverticulitis of intestine, part unspecified, without perforation or abscess without bleeding: Secondary | ICD-10-CM | POA: Diagnosis not present

## 2021-02-03 DIAGNOSIS — A419 Sepsis, unspecified organism: Secondary | ICD-10-CM | POA: Diagnosis not present

## 2021-02-03 DIAGNOSIS — M316 Other giant cell arteritis: Secondary | ICD-10-CM

## 2021-02-03 LAB — BASIC METABOLIC PANEL
Anion gap: 6 (ref 5–15)
BUN: 21 mg/dL (ref 8–23)
CO2: 26 mmol/L (ref 22–32)
Calcium: 8.7 mg/dL — ABNORMAL LOW (ref 8.9–10.3)
Chloride: 108 mmol/L (ref 98–111)
Creatinine, Ser: 0.75 mg/dL (ref 0.44–1.00)
GFR, Estimated: >60 mL/min (ref >60)
Glucose, Bld: 86 mg/dL (ref 70–99)
Potassium: 3.7 mmol/L (ref 3.5–5.1)
Sodium: 140 mmol/L (ref 135–145)

## 2021-02-03 LAB — CBC
HCT: 27.9 % — ABNORMAL LOW (ref 36.0–46.0)
Hemoglobin: 8.3 g/dL — ABNORMAL LOW (ref 12.0–15.0)
MCH: 22.4 pg — ABNORMAL LOW (ref 26.0–34.0)
MCHC: 29.7 g/dL — ABNORMAL LOW (ref 30.0–36.0)
MCV: 75.4 fL — ABNORMAL LOW (ref 80.0–100.0)
Platelets: 467 10*3/uL — ABNORMAL HIGH (ref 150–400)
RBC: 3.7 MIL/uL — ABNORMAL LOW (ref 3.87–5.11)
RDW: 20 % — ABNORMAL HIGH (ref 11.5–15.5)
WBC: 7.4 10*3/uL (ref 4.0–10.5)
nRBC: 0 % (ref 0.0–0.2)

## 2021-02-03 LAB — GLUCOSE, CAPILLARY: Glucose-Capillary: 128 mg/dL — ABNORMAL HIGH (ref 70–99)

## 2021-02-03 MED ORDER — PREDNISONE 50 MG PO TABS
50.0000 mg | ORAL_TABLET | Freq: Four times a day (QID) | ORAL | Status: AC
  Administered 2021-02-03 – 2021-02-04 (×3): 50 mg via ORAL
  Filled 2021-02-03 (×3): qty 1

## 2021-02-03 MED ORDER — DIPHENHYDRAMINE HCL 25 MG PO CAPS
50.0000 mg | ORAL_CAPSULE | Freq: Once | ORAL | Status: AC
  Administered 2021-02-04: 06:00:00 50 mg via ORAL
  Filled 2021-02-03: qty 2

## 2021-02-03 NOTE — Progress Notes (Signed)
Progress Note   Patient: Wendy Sanchez VQM:[id number] DOB: [birth year] DOA: 01/30/2021     4 DOS: the patient was seen and examined on 02/03/2021   Brief hospital course: 53 y f with COPD , CHF, HTN, PE on Eliquis, giant cell arteritis on prednisone, colostomy 11/2020 s/p perforated sigmoid diverticulitis, hospitalized at [facility] from 12/13 to 01/15/2021 for septic shock secondary to intra-abdominal abscesses, not drainable by IR and discharged to SNF on IV Zosyn via PICC line (12/25-2/12/23) with plans for outpatient ID follow-up, repeat CAT scan 4 weeks from discharge to determine need for longer course, who presents to the ED from SNF after she developed shaking chills and fever up to 101.5.  1/20 - ID, Intensivist & surgery seen. Removed PICC line as potential source of infection. Started Meropenem and Vanco per ID. Stress dose steroids per ICU team. Not surgical abd and likely not the source of infection per surgery 1/21: Moved out to floor.  Tolerating diet.  Blood pressure stable 1/22: Increased prednisone to 20 mg as per home dose.  Also restarted Percocet and gabapentin per her home dose.  1/23 - Remains afebrile and blood pressure controlled. CT AP per ID tomorrow   Assessment and Plan * Severe sepsis ([place])- (present on admission) PICC line removed as potential source. CT abd not obvious for focal infection. Seen by surgery and ID.  Continue meropenem and stopping vanco per ID.  Blood pressure remained stable.  CT A-P with contrast (needs premedication as contras allergy) tomorrow   S/P PICC central line placement Removed on this admission as worrisome for potential source.  Await tip culture  Compression fracture of T2 vertebra (HCC)- (present on admission) Continue Percocet per her home regimen dose  Chronic diastolic CHF (congestive heart failure) ([place])- (present on admission) Well compensated at this time.  Continue Lasix 20 mg for now  Acute diverticulitis- (present on  admission) Continue Abx as above per ID. No surgical indication. CT A-P tomorrow  AKI (acute kidney injury) ([place])- (present on admission) Resolved. monitor while on Lasix  Colostomy in place [facility]) Site doesn't look infected and is working. Colostomy was done in Sept'22 at [facility].  Giant cell arteritis ([place])- (present on admission) On steroids.  Chronic, continuous use of opioids- (present on admission) On prednisone 20 mg once daily chronically  Morbid obesity with BMI of 45.0-49.9, adult ([place]) Complicates her prognosis.   Subjective: no new c/o, weak and requesting to go SNF  Objective Vital signs were reviewed and unremarkable.  Constitutional:70 y morbidly obese.Not in any apparent distress HEENT: Head:Normocephalic and atraumatic.  Eyes:PERLA, EOMI, Conjunctivae are normal. Sclera is non-icteric.  Mouth/Throat:Mucous membranes are moist.  Neck:Supple with no signs of meningismus. Cardiovascular:Regular rate and rhythm. No murmurs, gallops, or rubs. 2+ symmetrical distal pulses are present . No JVD. No LE edema.  Respiratory:Respiratory effort normal .Lungs sounds clear bilaterally. No wheezes, crackles, or rhonchi.  Gastrointestinal:Soft, non tender, non distended. Positive bowel sounds.Colostomyis functional and no s/s of infection Genitourinary:No CVA tenderness. Musculoskeletal:Nontender with normal range of motion in all extremities. No cyanosis, or erythema of extremities. Neurologic:awake and alert, non focal Skin:Skin is warm, dry. No rash or ulcers Psychiatric: Mood and affect are appropriate  Data Reviewed:  anemia, thrombocytosis  Family Communication: daughter over phone  Disposition: Status is: Inpatient  Remains inpatient appropriate because: CT A-P tomorrow per ID, TOC working on SNF placement          Time spent: 35 minutes  Author: Max Sane, MD 02/03/2021  8:06 PM  For on call review  [email or web address].

## 2021-02-03 NOTE — Assessment & Plan Note (Signed)
On steroids.

## 2021-02-03 NOTE — Assessment & Plan Note (Signed)
Complicates her prognosis.

## 2021-02-03 NOTE — TOC Progression Note (Signed)
Transition of Care [facility]) - Progression Note    Patient Details  Name: Wendy Sanchez MRN: [id number] Date of Birth: March 12, 1950  Transition of Care [facility]) CM/SW [place], RN Phone Number: 02/03/2021, 4:16 PM  Clinical Narrative:   Patient is in copay days at SNF, states she is unable to pay for the copay days at this time.  [facility] states they are able to take her back, but would require payment up front.  Patient states she would like time to discuss with her sister, as she is fearful of going home.  "I am afraid of sepsis again".  Carolynn Sayers states she can speak with family about antibiotic training if patient returns home.  Patient is also fearful there will not be anyone to care for her if she returns home and wants to speak with sister about this.  TOC contact information provided.  TOC to follow.    Expected Discharge Plan:  (TBD) Barriers to Discharge: Continued Medical Work up  Expected Discharge Plan and Services Expected Discharge Plan:  (TBD)       Living arrangements for the past 2 months: [place]                                       Social Determinants of Health (SDOH) Interventions    Readmission Risk Interventions Readmission Risk Prevention Plan 02/03/2021  Transportation Screening Complete  Medication Review [employer]) Complete  PCP or Specialist appointment within 3-5 days of discharge Complete  HRI or Home Care Consult Complete  Palliative Care Screening Not [place] Complete  Some recent data might be hidden

## 2021-02-03 NOTE — Progress Notes (Addendum)
Physical Therapy Treatment Patient Details Name: Wendy Sanchez MRN: [id number] DOB: June 14, 1950 Today's Date: 02/03/2021   History of Present Illness Pt is a 71 y/o F admitted on 01/30/21 with severe sepsis 2/2 acute diverticulitis. On 01/31/21 pt briefly developed hypotension concerning for developing septic shock & transferred to stepdown; ulimately did not require pressors. 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 PMH: COPD, CHF, HTN, PE on eliquis, giant cell arteritis on prednisone    PT Comments    Pt seen for PT tx with pt agreeable & sister present to observe. Pt continues to be motivated to participate & completes supine>sit with use of bed rails & mod I. Pt is able to transfer sit>stand from elevated EOB with supervision with RW with good awareness re: need to scoot forward before transferring. Pt does require min, increasing to mod assist, for sit>stand from low recliner during session too. Pt is able to ambulate twice with RW & CGA with PT providing chair follow & pt requiring seated rest breaks 2/2 BLE fatigue. Pt endorses SOB but SPO2 >90% on room air with PT educating pt on pursed lip breathing. Pt is making good progress with functional mobility & would benefit from ongoing PT services.   Addendum: Changes in frequency made, but ultimately reverted back to 2x/week. Made recommendations for CIR but CIR reports payor source will not be approved, so PT recommendation updated to SNF to reflect that.    Recommendations for follow up therapy are one component of a multi-disciplinary discharge planning process, led by the attending physician.  Recommendations may be updated based on patient status, additional functional criteria and insurance authorization.  Follow Up Recommendations  Skilled nursing-short term rehab (<3  hours/day)     Assistance Recommended at Discharge Intermittent Supervision/Assistance  Patient can return home with the following A lot of help with walking and/or transfers;A lot of help with bathing/dressing/bathroom;Assist for transportation;Assistance with cooking/housework;Help with stairs or ramp for entrance   Equipment Recommendations   (bariatric RW)    Recommendations for Other Services       Precautions / Restrictions Precautions Precautions: Fall Restrictions Weight Bearing Restrictions: No     Mobility  Bed Mobility Overal bed mobility: Needs Assistance Bed Mobility: Supine to Sit     Supine to sit: Modified independent (Device/Increase time), HOB elevated (use of bed rails)          Transfers Overall transfer level: Needs assistance Equipment used: Rolling walker (2 wheels) Transfers: Bed to chair/wheelchair/BSC, Sit to/from Stand Sit to Stand:  (supervision from elevated EOB, min assist from recliner on 1st attempt, mod assist on 2nd attempt)   Step pivot transfers: Min guard            Ambulation/Gait Ambulation/Gait assistance: Min guard Gait Distance (Feet): 20 Feet (+ 25 ft) Assistive device: Rolling walker (2 wheels) (bariatric RW) Gait Pattern/deviations: Decreased step length - right, Decreased stride length, Decreased step length - left Gait velocity: decreased         Stairs             Wheelchair Mobility    Modified Rankin (Stroke Patients Only)       Balance Overall balance assessment: Needs assistance Sitting-balance support: Feet supported, Bilateral upper extremity supported Sitting balance-Leahy Scale: Good     Standing balance support: Bilateral upper extremity supported, During functional activity  Standing balance-Leahy Scale: Fair                              Cognition Arousal/Alertness: Awake/alert Behavior During Therapy: WFL for tasks assessed/performed Overall Cognitive Status: Within  Functional Limits for tasks assessed                                 General Comments: Pleasant lady, motivated to participate & get better.        Exercises      General Comments General comments (skin integrity, edema, etc.): SPO2 >90% on room air      Pertinent Vitals/Pain Pain Assessment Pain Assessment: Faces Faces Pain Scale: Hurts a little bit Pain Location: low back Pain Descriptors / Indicators: Discomfort (chronic pain) Pain Intervention(s): Repositioned (pillow placed for support)    Home Living                          Prior Function            PT Goals (current goals can now be found in the care plan section) Acute Rehab PT Goals Patient Stated Goal: get better PT Goal Formulation: With patient Time For Goal Achievement: 02/15/21 Potential to Achieve Goals: Good Progress towards PT goals: Progressing toward goals    Frequency    Min 2X/week      PT Plan Discharge plan needs to be updated;Frequency needs to be updated    Co-evaluation              AM-PAC PT "6 Clicks" Mobility   Outcome Measure  Help needed turning from your back to your side while in a flat bed without using bedrails?: A Little Help needed moving from lying on your back to sitting on the side of a flat bed without using bedrails?: A Little Help needed moving to and from a bed to a chair (including a wheelchair)?: A Little Help needed standing up from a chair using your arms (e.g., wheelchair or bedside chair)?: A Little Help needed to walk in hospital room?: A Little Help needed climbing 3-5 steps with a railing? : A Lot 6 Click Score: 17    End of Session Equipment Utilized During Treatment: Gait belt Activity Tolerance: Patient tolerated treatment well Patient left: in chair;with chair alarm set;with call bell/phone within reach;with family/visitor present Nurse Communication: Mobility status PT Visit Diagnosis: Unsteadiness on feet  (R26.81);Muscle weakness (generalized) (M62.81);Difficulty in walking, not elsewhere classified (R26.2)     Time: 9024-0973 PT Time Calculation (min) (ACUTE ONLY): 23 min  Charges:  $Therapeutic Activity: 23-37 mins                     Lavone Nian, PT, DPT 02/03/21, 3:10 PM   Waunita Schooner 02/03/2021, 3:09 PM

## 2021-02-03 NOTE — Assessment & Plan Note (Signed)
PICC line removed as potential source. CT abd not obvious for focal infection. Seen by surgery and ID.  Continue meropenem and stopping vanco per ID.  Blood pressure remained stable.  CT A-P with contrast (needs premedication as contras allergy) tomorrow

## 2021-02-03 NOTE — Assessment & Plan Note (Signed)
Resolved. monitor while on Lasix

## 2021-02-03 NOTE — Evaluation (Signed)
Occupational Therapy Evaluation Patient Details Name: Wendy Sanchez MRN: [id number] DOB: 11/11/1950 Today's Date: 02/03/2021   History of Present Illness Pt is a 71 y/o F admitted on 01/30/21 with severe sepsis 2/2 acute diverticulitis. On 01/31/21 pt briefly developed hypotension concerning for developing septic shock & transferred to stepdown; ulimately did not require pressors. 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 PMH: COPD, CHF, HTN, PE on eliquis, giant cell arteritis on prednisone   Clinical Impression   Pt was seen for OT evaluation this date. Prior to hospital admission, pt was at [facility] for rehab and was to the poitn where she was walking up to 120' + 2WW per pt's report. Prior to SNF and prior hospitalization, pt was living alone and independent. Pt lives in a 1 story home with ramped entrance and reports that her son just moved in with her and is trained in her colostomy care. When he isn't home, the pt's sister is there to assist as needed. Pt endorses that her back pain limits her standing tolerance for meal prep and ADL tasks. She had been using a rollator for mobility and for seated rest breaks as needed for ADL. Pt expresses motivation to improve strength and "do what she has to do" to get better and be independent. Currently pt demonstrates impairments as described below (See OT problem list) which functionally limit her ability to perform ADL/self-care tasks. Pt currently requires MIN-MOD A for LB ADL and for bed mobility (BLE mgt). Pt instructed in log roll and additional strategies to support improved back pain including cognitive behavioral pain coping strategies, task modifications, activity pacing, and use of AE/DME for LB ADL. Pt verbalized understanding and appreciative of instruction. Pt would benefit from  skilled OT services to address noted impairments and functional limitations (see below for any additional details) in order to maximize safety and independence while minimizing falls risk and caregiver burden. Upon hospital discharge, recommend STR to maximize pt safety and return to PLOF. Pt hopeful to return home with family if able to progress.      Recommendations for follow up therapy are one component of a multi-disciplinary discharge planning process, led by the attending physician.  Recommendations may be updated based on patient status, additional functional criteria and insurance authorization.   Follow Up Recommendations  Skilled nursing-short term rehab (<3 hours/day)    Assistance Recommended at Discharge Intermittent Supervision/Assistance  Patient can return home with the following A lot of help with walking and/or transfers;A lot of help with bathing/dressing/bathroom;Assist for transportation;Assistance with cooking/housework;Help with stairs or ramp for entrance    Functional Status Assessment  Patient has had a recent decline in their functional status and demonstrates the ability to make significant improvements in function in a reasonable and predictable amount of time.  Equipment Recommendations  Other (comment) (reacher, LH sponge, LH shoe horn, sock aid, elastic shoe laces)    Recommendations for Other Services       Precautions / Restrictions Precautions Precautions: Fall Restrictions Weight Bearing Restrictions: No      Mobility Bed Mobility               General bed mobility comments: pt able to perform sit>sup and repositioning in bed by herself with use of bed rails, MIN-MOD A for BLE mgt back to bed    Transfers  General transfer comment: pt declined, reports just returnin to bed wiht staff just prior to OT's arrival      Balance                                           ADL either performed or  assessed with clinical judgement   ADL Overall ADL's : Needs assistance/impaired                                       General ADL Comments: Pt currently requires set up for seated UB and grooming tasks, MIN-MOD A for LB ADL     Vision         Perception     Praxis      Pertinent Vitals/Pain Pain Assessment Pain Assessment: 0-10 Pain Score: 8  Pain Location: T12 Pain Descriptors / Indicators: Throbbing Pain Intervention(s): Limited activity within patient's tolerance, Monitored during session, Repositioned, Patient requesting pain meds-RN notified, RN gave pain meds during session, Utilized relaxation techniques     Hand Dominance     Extremity/Trunk Assessment Upper Extremity Assessment Upper Extremity Assessment: Generalized weakness   Lower Extremity Assessment Lower Extremity Assessment: Generalized weakness       Communication Communication Communication: No difficulties   Cognition Arousal/Alertness: Awake/alert Behavior During Therapy: WFL for tasks assessed/performed Overall Cognitive Status: Within Functional Limits for tasks assessed                                       General Comments  SPO2 >90% on room air    Exercises Other Exercises Other Exercises: Pt educated in AE and where to purchase for LB ADL and to assist with bed mobility. Other Exercises: Pt instructed in log roll and additional strategies to support improved back pain including cognitive behavioral pain coping strategies, task modifications, activity pacing, and use of AE/DME.   Shoulder Instructions      Home Living Family/patient expects to be discharged to:: Skilled nursing facility                                 Additional Comments: Pt recently from SNF for rehab, home layout: 1 story, ramped entrance, has TTB, rollator, BSC, and adjustable bed. Pt reports son just moved in with her and is trained in her colostomy care and when  he's not home, sister is there with her.      Prior Functioning/Environment               Mobility Comments: Pt reports she was ambulating "some" with therapy at SNF. Prior to initial hospitalization months ago pt was independent, driving, living alone. ADLs Comments: Pt reports at rehab she received assist from staff for seated bathing and socks/shoes, but otherwise was able to dress herself. She endorses difficulty with standing for longer periods of time which makes meal prep challenging and has difficulty with BLE mgt when getting in bed        OT Problem List: Decreased strength;Pain;Decreased activity tolerance;Impaired balance (sitting and/or standing);Decreased knowledge of use of DME or AE      OT Treatment/Interventions: Self-care/ADL training;Therapeutic exercise;Therapeutic activities;DME and/or AE instruction;Patient/family  education;Balance training    OT Goals(Current goals can be found in the care plan section) Acute Rehab OT Goals Patient Stated Goal: get stronger and go home with family OT Goal Formulation: With patient Time For Goal Achievement: 02/17/21 Potential to Achieve Goals: Good ADL Goals Pt Will Perform Lower Body Dressing: with modified independence;with adaptive equipment;sit to/from stand Pt Will Transfer to Toilet: with modified independence;ambulating (LRAD PRN, elevated commode) Additional ADL Goal #1: Pt will verbalize plan to implement at least 1 learned cognitive behavioral pain coping strategy to support greater self mgt of pain. Additional ADL Goal #2: Pt will perform bed mobility with modified independence using AE as needed for BLE mgt, 3/3 opportunities.  OT Frequency: Min 2X/week    Co-evaluation              AM-PAC OT "6 Clicks" Daily Activity     Outcome Measure Help from another person eating meals?: None Help from another person taking care of personal grooming?: None Help from another person toileting, which includes using  toliet, bedpan, or urinal?: A Little Help from another person bathing (including washing, rinsing, drying)?: A Lot Help from another person to put on and taking off regular upper body clothing?: A Little Help from another person to put on and taking off regular lower body clothing?: A Lot 6 Click Score: 18   End of Session    Activity Tolerance: Patient tolerated treatment well Patient left: in bed  OT Visit Diagnosis: Other abnormalities of gait and mobility (R26.89);Muscle weakness (generalized) (M62.81);Pain Pain - Right/Left:  (middle back)                Time: 5465-6812 OT Time Calculation (min): 38 min Charges:  OT General Charges $OT Visit: 1 Visit OT Evaluation $OT Eval Moderate Complexity: 1 Mod OT Treatments $Self Care/Home Management : 23-37 mins  Ardeth Perfect., MPH, MS, OTR/L ascom [phone] 02/03/21, 4:34 PM

## 2021-02-03 NOTE — Assessment & Plan Note (Signed)
On prednisone 20 mg once daily chronically

## 2021-02-03 NOTE — Assessment & Plan Note (Signed)
Site doesn't look infected and is working. Colostomy was done in Sept'22 at [facility].

## 2021-02-03 NOTE — Progress Notes (Signed)
Date of Admission:  01/30/2021    ID: KAETLYN NOA is a 71 y.o. female Principal Problem:   Severe sepsis ([place]) Active Problems:   Morbid obesity with BMI of 45.0-49.9, adult (HCC)   Chronic, continuous use of opioids   Giant cell arteritis (HCC)   Colostomy in place [facility])   AKI (acute kidney injury) ([place])   Acute diverticulitis   Current chronic use of systemic steroids   Chronic diastolic CHF (congestive heart failure) (HCC)   Compression fracture of T2 vertebra (HCC)   S/P PICC central line placement    Subjective: Has some discomfort near umbilicus No fever Eating No nausea or vomiting  Medications:   apixaban  5 mg Oral BID   Chlorhexidine Gluconate Cloth  6 each Topical Q0600   furosemide  20 mg Intravenous Daily   gabapentin  200 mg Oral TID   predniSONE  20 mg Oral Q breakfast    Objective: Vital signs in last 24 hours: Temp:  [97.7 F (36.5 C)-98.2 F (36.8 C)] 97.8 F (36.6 C) (01/23 1129) Pulse Rate:  [83-97] 94 (01/23 1129) Resp:  [16-20] 20 (01/23 1129) BP: (102-127)/(54-77) 127/77 (01/23 1129) SpO2:  [96 %-100 %] 100 % (01/23 1129)  PHYSICAL EXAM:  General: Alert, cooperative, no distress, appears stated age. pale Lungs: b/la ir entry Heart: Regular rate and rhythm, no murmur, rub or gallop. Abdomen: Soft, non-tender,not distended. Bowel sounds normal. No masses Extremities: atraumatic, no cyanosis. No edema. No clubbing Skin: No rashes or lesions. Or bruising Lymph: Cervical, supraclavicular normal. Neurologic: Grossly non-focal  Lab Results Recent Labs    02/02/21 0534 02/03/21 0452  WBC 9.5 7.4  HGB 8.1* 8.3*  HCT 27.1* 27.9*  NA 141 140  K 3.1* 3.7  CL 105 108  CO2 26 26  BUN 24* 21  CREATININE 0.96 0.75   Liver Panel Recent Labs    02/01/21 0512  PROT 6.2*  ALBUMIN 2.3*  AST 20  ALT 21  ALKPHOS 83  BILITOT 0.4    Microbiology: 01/30/21 BC NG Studies/Results: No results found.   Assessment/Plan:  71 year old  female with history of HFpEF, PE, bilateral TKA, diverticular perforation with colectomy and colostomy in September 2022 followed by readmission in December 2022 for fever.  Was treated like an intra-abdominal infection even though there was no clear source for it.  She was discharged from [facility] by ID on Zosyn for total of 6 weeks.  The end date of Zosyn was 02/23/2021.  While on Zosyn patient developed fever and hypotension and was admitted to the ICU  Fever, sepsis/SIRS-like picture - resolved- Blood culture neg  PICC line  has been removed  CT abdomen did not show any focal infection but was done without contrast.  Pt is currently on vanco and meropenem- DC vanco Will repeat CT abdomen with contrast to look for any collection..  Rule out adrenal insufficiency patient has been on some prednisone for giant cell arteritis.Cortisol level was normal. She is currently on stress dose steroids.    There is a mention of contrast allegy in Epic but patient took CT with IV contrast on 01/02/21 and was fine- No mention of allergy in [facility] records- Discussed with radiologist.   AKI has resolved   Giant cell arteritis  on steroids  History of PE on Eliquis   Bilateral leg edema.  Has heart failure Increased BMI patient status post gastric bypass  Discussed the management with the patient and care team.

## 2021-02-03 NOTE — Assessment & Plan Note (Signed)
Removed on this admission as worrisome for potential source.  Await tip culture

## 2021-02-03 NOTE — TOC Initial Note (Signed)
Transition of Care [facility]) - Initial/Assessment Note    Patient Details  Name: Wendy Sanchez MRN: [id number] Date of Birth: April 15, 1950  Transition of Care [facility]) CM/SW Contact:    Pete Pelt, RN Phone Number: 02/03/2021, 2:31 PM  Clinical Narrative:     patient is a resident of [facility], planning to return on discharge.  Patient will be discharged on IV antibiotics, Carolynn Sayers aware.  TOC will follow              Expected Discharge Plan: [place] Barriers to Discharge: Continued Medical Work up   Patient Goals and CMS Choice        Expected Discharge Plan and Services Expected Discharge Plan: [place] arrangements for the past 2 months: [place]                                      Prior Living Arrangements/Services Living arrangements for the past 2 months: [place] Lives with:: Facility Resident Patient language and need for interpreter reviewed:: Yes (No interpreter required) Do you feel safe going back to the place where you live?: Yes      Need for Family Participation in Patient Care: Yes (Comment) Care giver support system in place?: Yes (comment)   Criminal Activity/Legal Involvement Pertinent to Current Situation/Hospitalization: No - Comment as needed  Activities of Daily Living Home Assistive Devices/Equipment: None ADL Screening (condition at time of admission) Patient's cognitive ability adequate to safely complete daily activities?: Yes Is the patient deaf or have difficulty hearing?: No Does the patient have difficulty seeing, even when wearing glasses/contacts?: No Does the patient have difficulty concentrating, remembering, or making decisions?: No Patient able to express need for assistance with ADLs?: Yes Does the patient have difficulty dressing or bathing?: No Independently performs ADLs?: Yes (appropriate for developmental age) Does the patient have  difficulty walking or climbing stairs?: Yes Weakness of Legs: Both Weakness of Arms/Hands: None  Permission Sought/Granted Permission sought to share information with : Facility Art therapist granted to share information with : Yes, Verbal Permission Granted              Emotional Assessment Appearance:: Appears stated age Attitude/Demeanor/Rapport: Gracious Affect (typically observed): Pleasant Orientation: : Oriented to Self, Oriented to Place, Oriented to  Time, Oriented to Situation Alcohol / Substance Use: Not Applicable Psych Involvement: No (comment)  Admission diagnosis:  Severe sepsis ([place]) [A41.9, R65.20] Sepsis, due to unspecified organism, unspecified whether acute organ dysfunction present [facility]) [A41.9] Patient Active Problem List   Diagnosis Date Noted   Acute diverticulitis 01/30/2021   Current chronic use of systemic steroids 01/30/2021   Chronic diastolic CHF (congestive heart failure) ([place]) 01/30/2021   Compression fracture of T2 vertebra ([place]) 01/30/2021   S/P PICC central line placement 01/30/2021   Sinus tachycardia 11/12/2020   COVID-19 virus infection 11/11/2020   Colostomy in place [facility]) 11/09/2020   AKI (acute kidney injury) ([place]) 11/09/2020   Severe sepsis ([place]) 11/07/2020   Pain in joint, shoulder region 07/03/2020   Giant cell arteritis ([place]) 05/01/2020   Frontal headache 05/30/2019   Carcinoma, renal cell, left ([place]) 11/14/2018   Pulmonary embolism ([place]) 07/13/2018   DDD (degenerative disc disease), lumbar 06/30/2018   Chronic low back pain 06/30/2018   Hip pain, bilateral 06/30/2018   Chronic, continuous use of opioids  06/30/2018   PVC (premature ventricular contraction) 06/04/2017   Chest pain 10/19/2016   Presence of right artificial knee joint 05/18/2016   Pes anserinus bursitis of left knee 05/18/2016   Unilateral primary osteoarthritis, right knee 12/13/2015   Hx of total knee arthroplasty 12/13/2015    Osteoarthritis of left knee 08/02/2015   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   Morbid obesity with BMI of 45.0-49.9, adult ([place]) 07/25/2015   Spinal stenosis of lumbar region 06/19/2015   Chronic pain syndrome 10/30/2014   PCP:  Crissie Figures, PA-C Pharmacy:   [facility] [address] [place] [address] Phone: [phone] Fax: [phone]     Social Determinants of Health (SDOH) Interventions    Readmission Risk Interventions Readmission Risk Prevention Plan 02/03/2021  Transportation Screening Complete  Medication Review (RN Care Manager) Complete  PCP or Specialist appointment within 3-5 days of discharge Complete  HRI or Home Care Consult Complete  Palliative Care Screening Not Applicable  Skilled Nursing Facility Complete  Some recent data might be hidden

## 2021-02-03 NOTE — Progress Notes (Signed)
Inpatient Rehab Admissions Coordinator:   Per PT recs,  patient was screened for CIR candidacy by Clemens Catholic, MS, CCC-SLP. Payor source is unlikely to approve CIR for this diagnosis. Additionally, Pt. Is a resident of [facility] and plans to return at dc. I will not pursue CIR consult. Please contact me any with questions.  Clemens Catholic, [place], [place] Admissions Coordinator  [phone] ([place]) [phone] (office)

## 2021-02-03 NOTE — Assessment & Plan Note (Signed)
Continue Abx as above per ID. No surgical indication. CT A-P tomorrow

## 2021-02-03 NOTE — Progress Notes (Signed)
Inpatient Rehab Admissions Coordinator:   Per PT recs,  patient was screened for CIR candidacy by Clemens Catholic, MS, CCC-SLP. Payor source is unlikely to approve CIR for this diagnosis. I will not pursue CIR consult. Please contact me any with questions.  Clemens Catholic, [place], [place] Admissions Coordinator  [phone] ([place]) [phone] (office)

## 2021-02-03 NOTE — Assessment & Plan Note (Signed)
Well compensated at this time.  Continue Lasix 20 mg for now

## 2021-02-03 NOTE — Assessment & Plan Note (Signed)
Continue Percocet per her home regimen dose

## 2021-02-04 ENCOUNTER — Inpatient Hospital Stay: Payer: Medicare Other

## 2021-02-04 LAB — BASIC METABOLIC PANEL
Anion gap: 8 (ref 5–15)
BUN: 16 mg/dL (ref 8–23)
CO2: 26 mmol/L (ref 22–32)
Calcium: 9.2 mg/dL (ref 8.9–10.3)
Chloride: 105 mmol/L (ref 98–111)
Creatinine, Ser: 0.71 mg/dL (ref 0.44–1.00)
GFR, Estimated: >60 mL/min (ref >60)
Glucose, Bld: 141 mg/dL — ABNORMAL HIGH (ref 70–99)
Potassium: 4.6 mmol/L (ref 3.5–5.1)
Sodium: 139 mmol/L (ref 135–145)

## 2021-02-04 LAB — CBC
HCT: 31.2 % — ABNORMAL LOW (ref 36.0–46.0)
Hemoglobin: 9.3 g/dL — ABNORMAL LOW (ref 12.0–15.0)
MCH: 22.7 pg — ABNORMAL LOW (ref 26.0–34.0)
MCHC: 29.8 g/dL — ABNORMAL LOW (ref 30.0–36.0)
MCV: 76.1 fL — ABNORMAL LOW (ref 80.0–100.0)
Platelets: 514 10*3/uL — ABNORMAL HIGH (ref 150–400)
RBC: 4.1 MIL/uL (ref 3.87–5.11)
RDW: 19.7 % — ABNORMAL HIGH (ref 11.5–15.5)
WBC: 7.2 10*3/uL (ref 4.0–10.5)
nRBC: 0 % (ref 0.0–0.2)

## 2021-02-04 LAB — CULTURE, BLOOD (ROUTINE X 2)
Culture: NO GROWTH
Culture: NO GROWTH
Special Requests: ADEQUATE
Special Requests: ADEQUATE

## 2021-02-04 MED ORDER — IOHEXOL 300 MG/ML  SOLN
100.0000 mL | Freq: Once | INTRAMUSCULAR | Status: AC | PRN
  Administered 2021-02-04: 07:00:00 100 mL via INTRAVENOUS

## 2021-02-04 MED ORDER — FLUCONAZOLE 50 MG PO TABS
150.0000 mg | ORAL_TABLET | Freq: Once | ORAL | Status: AC
  Administered 2021-02-04: 21:00:00 150 mg via ORAL
  Filled 2021-02-04: qty 1

## 2021-02-04 MED ORDER — SODIUM CHLORIDE 0.9 % IV SOLN
1.0000 g | Freq: Once | INTRAVENOUS | Status: AC
  Administered 2021-02-05: 08:00:00 1000 mg via INTRAVENOUS
  Filled 2021-02-04: qty 1

## 2021-02-04 MED ORDER — BARIUM SULFATE 2.1 % PO SUSP: ORAL | Status: AC

## 2021-02-04 NOTE — Assessment & Plan Note (Addendum)
-   Continue home medications 

## 2021-02-04 NOTE — Assessment & Plan Note (Signed)
Continue Abx as above per ID. No surgical indication. CT A-P not showing any acute pathology

## 2021-02-04 NOTE — Assessment & Plan Note (Signed)
Resolved. monitor while on Lasix

## 2021-02-04 NOTE — Assessment & Plan Note (Signed)
Removed on this admission as worrisome for potential source.

## 2021-02-04 NOTE — Progress Notes (Signed)
Occupational Therapy Treatment Patient Details Name: Wendy Sanchez MRN: [id number] DOB: Jan 28, 1950 Today's Date: 02/04/2021   History of present illness Pt is a 71 y/o F admitted on 01/30/21 with severe sepsis 2/2 acute diverticulitis. On 01/31/21 pt briefly developed hypotension concerning for developing septic shock & transferred to stepdown; ulimately did not require pressors. Of note, pt s/p colostomy 11/2020 due to perforated sigmoid diverticulitis & recently hospitalized at [facility] 12/24/20-01/15/21 for septic shock 2/2 intraabdominal abscesses which were not amendable to drainage by IR. PMH: COPD, CHF, HTN, PE on eliquis, giant cell arteritis on prednisone   OT comments  Pt seen for OT tx this date. Pt up in recliner and endorses feeling better. OT facilitated problem solving for more demanding ADL/IADL tasks at home including meal prep, laundry, and bathing with energy conservation principles and education in home/routines modifications, AE/DME, and activity pacing. Pt verbalized understanding. Pt progressing, updated to [facility] services to continue to address ECS for ADL/IADL and pt agreeable.    Recommendations for follow up therapy are one component of a multi-disciplinary discharge planning process, led by the attending physician.  Recommendations may be updated based on patient status, additional functional criteria and insurance authorization.    Follow Up Recommendations  Home health OT    Assistance Recommended at Discharge Intermittent Supervision/Assistance  Patient can return home with the following  Assist for transportation;Assistance with cooking/housework;Help with stairs or ramp for entrance;A little help with bathing/dressing/bathroom;A little help with walking and/or transfers   Equipment Recommendations  Other (comment) (reacher, LH sponge, LH shoe horn, sock aid, elastic shoe laces)    Recommendations for Other Services      Precautions / Restrictions  Precautions Precautions: Fall Precaution Comments: mod fall Restrictions Weight Bearing Restrictions: No       Mobility Bed Mobility               General bed mobility comments: NT up in recliner at start and end of session    Transfers Overall transfer level: Needs assistance Equipment used: Rolling walker (2 wheels) Transfers: Sit to/from Stand Sit to Stand: Supervision     Step pivot transfers: Supervision           Balance                                           ADL either performed or assessed with clinical judgement   ADL Overall ADL's : Needs assistance/impaired                                       General ADL Comments: Grossly set up and supervision for standing ADL, MIN A for LB ADL    Extremity/Trunk Assessment              Vision       Perception     Praxis      Cognition Arousal/Alertness: Awake/alert Behavior During Therapy: WFL for tasks assessed/performed Overall Cognitive Status: Within Functional Limits for tasks assessed                                          Exercises Other Exercises Other Exercises: OT facilitated problem solving for more demanding  ADL/IADL tasks at home including meal prep, laundry, and bathing with energy conservation principles and education in home/routines modifications, AE/DME, and activity pacing.    Shoulder Instructions       General Comments      Pertinent Vitals/ Pain       Pain Assessment Pain Assessment: No/denies pain  Home Living                                          Prior Functioning/Environment              Frequency  Min 2X/week        Progress Toward Goals  OT Goals(current goals can now be found in the care plan section)  Progress towards OT goals: Progressing toward goals  Acute Rehab OT Goals Patient Stated Goal: get stronger and go home with family OT Goal Formulation: With  patient Time For Goal Achievement: 02/17/21 Potential to Achieve Goals: Good  Plan Discharge plan needs to be updated;Frequency remains appropriate    Co-evaluation                 AM-PAC OT "6 Clicks" Daily Activity     Outcome Measure   Help from another person eating meals?: None Help from another person taking care of personal grooming?: None Help from another person toileting, which includes using toliet, bedpan, or urinal?: A Little Help from another person bathing (including washing, rinsing, drying)?: A Little Help from another person to put on and taking off regular upper body clothing?: None Help from another person to put on and taking off regular lower body clothing?: A Little 6 Click Score: 21    End of Session    OT Visit Diagnosis: Other abnormalities of gait and mobility (R26.89);Muscle weakness (generalized) (M62.81);Pain   Activity Tolerance Patient tolerated treatment well   Patient Left in chair;with call bell/phone within reach   Nurse Communication          Time: 0076-2263 OT Time Calculation (min): 12 min  Charges: OT General Charges $OT Visit: 1 Visit OT Treatments $Self Care/Home Management : 8-22 mins  Ardeth Perfect., MPH, MS, OTR/L ascom [phone] 02/04/21, 1:45 PM

## 2021-02-04 NOTE — Assessment & Plan Note (Signed)
Complicates her prognosis.

## 2021-02-04 NOTE — Assessment & Plan Note (Signed)
Well compensated at this time.  Continue Lasix 20 mg for now

## 2021-02-04 NOTE — Progress Notes (Signed)
Physical Therapy Treatment Patient Details Name: DELOISE MARCHANT MRN: [id number] DOB: 11-24-1950 Today's Date: 02/04/2021   History of Present Illness Pt is a 71 y/o F admitted on 01/30/21 with severe sepsis 2/2 acute diverticulitis. On 01/31/21 pt briefly developed hypotension concerning for developing septic shock & transferred to stepdown; ulimately did not require pressors. Of note, pt s/p colostomy 11/2020 due to perforated sigmoid diverticulitis & recently hospitalized at [facility] 12/24/20-01/15/21 for septic shock 2/2 intraabdominal abscesses which were not amendable to drainage by IR. PMH: COPD, CHF, HTN, PE on eliquis, giant cell arteritis on prednisone    PT Comments    Patient agreeable to PT session. She is motivated to improve. Patient performed bed mobility with mod independence. She is able to stand from bed elevated ~2 inches and from recliner with supervision. Patient ambulates 20 feet with RW and then 25 feet more. Limited by fatigue. She will continue to benefit from skilled PT while here to improve strength and endurance for safety at home.      Recommendations for follow up therapy are one component of a multi-disciplinary discharge planning process, led by the attending physician.  Recommendations may be updated based on patient status, additional functional criteria and insurance authorization.  Follow Up Recommendations  Home health PT     Assistance Recommended at Discharge Intermittent Supervision/Assistance  Patient can return home with the following A little help with bathing/dressing/bathroom;A little help with walking and/or transfers;Assistance with cooking/housework;Assist for transportation;Help with stairs or ramp for entrance   Equipment Recommendations  Rolling walker (2 wheels) (Bariatric RW)    Recommendations for Other Services       Precautions / Restrictions Precautions Precautions: Fall Precaution Comments: mod fall Restrictions Weight Bearing  Restrictions: No     Mobility  Bed Mobility Overal bed mobility: Modified Independent Bed Mobility: Supine to Sit     Supine to sit: Modified independent (Device/Increase time), HOB elevated     General bed mobility comments: pt able to perform sit>sup and repositioning in bed by herself with use of bed rails, increasd effort    Transfers Overall transfer level: Needs assistance Equipment used: Rolling walker (2 wheels) Transfers: Sit to/from Stand Sit to Stand: Supervision, From elevated surface           General transfer comment: patient is able to stand from bed with elevated ~2 inches. Able to stand from recliner with increased effort.    Ambulation/Gait Ambulation/Gait assistance: Supervision Gait Distance (Feet): 20 Feet Assistive device: Rolling walker (2 wheels) Gait Pattern/deviations: Decreased step length - right, Decreased stride length, Decreased step length - left Gait velocity: decreased     General Gait Details: able to ambulate 20' then 25' with supervision and BRW. Fatigued with this distance requiring seated rest after each bout.   Stairs             Wheelchair Mobility    Modified Rankin (Stroke Patients Only)       Balance Overall balance assessment: Modified Independent Sitting-balance support: Feet supported Sitting balance-Leahy Scale: Good     Standing balance support: Bilateral upper extremity supported, During functional activity, Reliant on assistive device for balance Standing balance-Leahy Scale: Fair Standing balance comment: no external support needed                            Cognition Arousal/Alertness: Awake/alert Behavior During Therapy: WFL for tasks assessed/performed Overall Cognitive Status: Within Functional Limits for tasks assessed  General Comments: Pleasant lady, motivated to participate & get better.        Exercises Other Exercises Other  Exercises: Reviewed B LE exercises to include; AP, LAQ, marching, glute squeezes, hip abd/add    General Comments        Pertinent Vitals/Pain Pain Assessment Pain Assessment: Faces Faces Pain Scale: Hurts a little bit Pain Location: back Pain Descriptors / Indicators: Discomfort, Sore Pain Intervention(s): Monitored during session, Limited activity within patient's tolerance, Repositioned    Home Living                          Prior Function            PT Goals (current goals can now be found in the care plan section) Acute Rehab PT Goals Patient Stated Goal: get better PT Goal Formulation: With patient Time For Goal Achievement: 02/15/21 Potential to Achieve Goals: Good Progress towards PT goals: Progressing toward goals    Frequency    Min 2X/week      PT Plan Discharge plan needs to be updated    Co-evaluation              AM-PAC PT "6 Clicks" Mobility   Outcome Measure  Help needed turning from your back to your side while in a flat bed without using bedrails?: A Little Help needed moving from lying on your back to sitting on the side of a flat bed without using bedrails?: A Little Help needed moving to and from a bed to a chair (including a wheelchair)?: A Little Help needed standing up from a chair using your arms (e.g., wheelchair or bedside chair)?: A Little Help needed to walk in hospital room?: A Little Help needed climbing 3-5 steps with a railing? : A Lot 6 Click Score: 17    End of Session   Activity Tolerance: Patient tolerated treatment well;Patient limited by fatigue Patient left: in chair;with call bell/phone within reach;with chair alarm set Nurse Communication: Mobility status PT Visit Diagnosis: Muscle weakness (generalized) (M62.81);Difficulty in walking, not elsewhere classified (R26.2)     Time: 6378-5885 PT Time Calculation (min) (ACUTE ONLY): 24 min  Charges:  $Gait Training: 23-37 mins                      Adan Beal, PT, GCS 02/04/21,11:26 AM

## 2021-02-04 NOTE — Assessment & Plan Note (Signed)
On steroids.

## 2021-02-04 NOTE — Assessment & Plan Note (Signed)
Continue Percocet per her home regimen dose

## 2021-02-04 NOTE — Progress Notes (Signed)
Patient returned from CT in stable condition.

## 2021-02-04 NOTE — Assessment & Plan Note (Signed)
Site doesn't look infected and is working. Colostomy was done in Sept'22 at [facility].

## 2021-02-04 NOTE — Care Management Important Message (Signed)
Important Message  Patient Details  Name: Wendy Sanchez MRN: [id number] Date of Birth: 04-23-1950   Medicare Important Message Given:  Yes     Dannette Shaelin 02/04/2021, 3:40 PM

## 2021-02-04 NOTE — Progress Notes (Signed)
Patient to CT in stable condition.

## 2021-02-04 NOTE — Progress Notes (Addendum)
Date of Admission:  01/30/2021    ID: Wendy Sanchez is a 71 y.o. female Principal Problem:   Severe sepsis ([place]) Active Problems:   Morbid obesity with BMI of 45.0-49.9, adult (HCC)   Chronic, continuous use of opioids   Giant cell arteritis ([place])   Colostomy in place [facility])   AKI (acute kidney injury) ([place])   Acute diverticulitis   Current chronic use of systemic steroids   Chronic diastolic CHF (congestive heart failure) ([place])   Compression fracture of T2 vertebra (HCC)   S/P PICC central line placement    Subjective: Patient had CT abdomen done this morning. No pain abdomen Appetite better Michela Pitcher she walked in the room Sat in the chair   Medications:   apixaban  5 mg Oral BID   fluconazole  150 mg Oral Once   furosemide  20 mg Intravenous Daily   gabapentin  200 mg Oral TID   predniSONE  20 mg Oral Q breakfast    Objective: Vital signs in last 24 hours: Temp:  [97.6 F (36.4 C)-98.4 F (36.9 C)] 98 F (36.7 C) (01/24 2001) Pulse Rate:  [76-96] 96 (01/24 2001) Resp:  [16-20] 20 (01/24 2001) BP: (106-133)/(64-79) 106/64 (01/24 2001) SpO2:  [98 %-100 %] 99 % (01/24 2001)  PHYSICAL EXAM:  General: Alert, cooperative, no distress, appears stated age. Lungs: b/la ir entry Heart: Regular rate and rhythm, no murmur, rub or gallop. Abdomen: Lap scar Colostomy bag Soft No obvious tenderness Extremities: Edema both legs Skin: No rashes or lesions. Or bruising Lymph: Cervical, supraclavicular normal. Neurologic: Grossly non-focal  Lab Results Recent Labs    02/03/21 0452 02/04/21 0630  WBC 7.4 7.2  HGB 8.3* 9.3*  HCT 27.9* 31.2*  NA 140 139  K 3.7 4.6  CL 108 105  CO2 26 26  BUN 21 16  CREATININE 0.75 0.71   Liver Panel No results for input(s): PROT, ALBUMIN, AST, ALT, ALKPHOS, BILITOT, BILIDIR, IBILI in the last 72 hours.   Microbiology: 01/30/21 BC NG Studies/Results: CT ABDOMEN PELVIS W CONTRAST  Result Date: 02/04/2021 CLINICAL DATA:   71 year old female with history of sepsis. EXAM: CT ABDOMEN AND PELVIS WITH CONTRAST TECHNIQUE: Multidetector CT imaging of the abdomen and pelvis was performed using the standard protocol following bolus administration of intravenous contrast. RADIATION DOSE REDUCTION: This exam was performed according to the departmental dose-optimization program which includes automated exposure control, adjustment of the mA and/or kV according to patient size and/or use of iterative reconstruction technique. CONTRAST:  136mL OMNIPAQUE IOHEXOL 300 MG/ML  SOLN COMPARISON:  CT the abdomen and pelvis 01/30/2021. FINDINGS: Lower chest: Right-sided Bochdalek's hernia containing only fat. Cardiomegaly. Hepatobiliary: Status post cholecystectomy. Tiny calcified granulomas in the liver. Liver has a slightly shrunken appearance and nodular contour, suggesting underlying cirrhosis. No suspicious cystic or solid hepatic lesions. No intra or extrahepatic biliary ductal dilatation. Pancreas: No pancreatic mass. No pancreatic ductal dilatation. No pancreatic or peripancreatic fluid collections or inflammatory changes. Spleen: Unremarkable. Adrenals/Urinary Tract: Bilateral kidneys and adrenal glands are normal in appearance. No hydroureteronephrosis. Urinary bladder is nearly completely decompressed, but otherwise unremarkable in appearance. Stomach/Bowel: Postoperative changes are again noted in the stomach. No pathologic dilatation of small bowel or colon. Status post partial sigmoidectomy with Hartmann's pouch and left lower quadrant colostomy. Numerous colonic diverticulae are again noted. The inflammation in the distal descending colon adjacent to diverticulae are seen on the recent prior examination has largely resolved. Vascular/Lymphatic: Aortic atherosclerosis, without evidence of aneurysm or  dissection in the abdominal or pelvic vasculature. No lymphadenopathy noted in the abdomen or pelvis. Reproductive: Status post hysterectomy.  Ovaries are not confidently identified may be surgically absent or atrophic. Other: No significant volume of ascites.  No pneumoperitoneum. Musculoskeletal: Chronic compression fracture of superior endplate of E75 with 17% loss of anterior vertebral body height, unchanged. There are no aggressive appearing lytic or blastic lesions noted in the visualized portions of the skeleton. IMPRESSION: 1. Resolution of previously noted descending colon diverticulitis. No new acute findings are noted on today's examination. 2. Aortic atherosclerosis. 3. Additional postoperative changes and incidental findings, as above. Electronically Signed   By: Vinnie Langton M.D.   On: 02/04/2021 07:45     Assessment/Plan:  71 year old female with history of HFpEF, PE, bilateral TKA, diverticular perforation with colectomy and colostomy in September 2022 followed by readmission in December 2022 for fever.  Was treated like an intra-abdominal infection even though there was no clear source for it.  She was discharged from [facility] by ID on Zosyn for total of 6 weeks.  The end date of Zosyn was 02/23/2021.  While on Zosyn patient developed fever and hypotension and was admitted to the ICU  Fever, sepsis/SIRS-like picture -CT abdomen had shown Mildly inflamed diverticula within the mid to distal descending colon  Blood culture neg  PICC line was was removed  Patient is currently on meropenem. At the CT abdomen done with IV contrast today, 02/04/2021 showed resolution of diverticulitis.  There was no intra-abdominal abscess or stranding. Patient is on day 6 of IV antibiotic.  Leukocytosis is resolved completely.  She is afebrile since 02/02/2020..  We will stop antibiotic tomorrow.  No need to continue antibiotics through 02/23/2021.   Patient received stress dose steroids of 48 hours. Marland Kitchen   AKI has resolved   Giant cell arteritis  on steroids, prednisone 20 mg.  History of PE on Eliquis   Bilateral leg edema.  Has heart  failure Increased BMI patient status post gastric bypass  Discussed the management with the patient and hospitalist..  I discussed with [facility] ID Dr. Beather Arbour yesterday. ID will sign off.  Call if needed.

## 2021-02-04 NOTE — Assessment & Plan Note (Addendum)
PICC line removed as potential source. CT abd not obvious for focal infection. Seen by surgery and ID.  Continue meropenem till tomorrow per ID.  Blood pressure remained stable.  CT A-P with contrast (needs premedication as contras allergy) not showing any other acute pathology Sepsis resolved

## 2021-02-04 NOTE — Progress Notes (Signed)
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  1/20 - ID, Intensivist & surgery seen. Removed PICC line as potential source of infection. Started Meropenem and Vanco per ID. Stress dose steroids per ICU team. Not surgical abd and likely not the source of infection per surgery 1/21: Moved out to floor.  Tolerating diet.  Blood pressure stable 1/22: Increased prednisone to 20 mg as per home dose.  Also restarted Percocet and gabapentin per her home dose.   1/23 - Remains afebrile and blood pressure controlled. 1/24-  CT AP not showing any acute pathology.  ID recommends 1 more days of IV meropenem after which she can be discharged home.  Insurance did not approve SNF/CIR   Assessment and Plan * Severe sepsis ([place])- (present on admission) PICC line removed as potential source. CT abd not obvious for focal infection. Seen by surgery and ID.  Continue meropenem till tomorrow per ID.  Blood pressure remained stable.  CT A-P with contrast (needs premedication as contras allergy) not showing any other acute pathology Sepsis resolved   S/P PICC central line placement Removed on this admission as worrisome for potential source.   Compression fracture of T2 vertebra ([facility])- (present on admission) Continue Percocet per her home regimen dose  Chronic diastolic CHF  (congestive heart failure) ([place])- (present on admission) Well compensated at this time.  Continue Lasix 20 mg for now  Acute diverticulitis- (present on admission) Continue Abx as above per ID. No surgical indication. CT A-P not showing any acute pathology  AKI (acute kidney injury) ([place])- (present on admission) Resolved. monitor while on Lasix  Colostomy in place [facility]) Site doesn't look infected and is working. Colostomy was done in Sept'22 at [facility].  Giant cell arteritis ([place])- (present on admission) On steroids.  Chronic, continuous use of opioids- (present on admission) Continue home medications  Morbid obesity with BMI of 45.0-49.9, adult [facility]) Complicates her prognosis.   Subjective: Sitting in the chair.  Feeling much better.  Sister at bedside  Objective Vital signs were reviewed and unremarkable.  Constitutional:70 y morbidly obese.Not in any apparent distress HEENT: Head:Normocephalic and atraumatic.  Eyes:PERLA, EOMI, Conjunctivae are normal. Sclera is non-icteric.  Mouth/Throat:Mucous membranes are moist.  Neck:Supple with no signs of meningismus. Cardiovascular:Regular rate and rhythm. No murmurs, gallops, or rubs Respiratory:Respiratory effort normal .Lungs sounds clear bilaterally. No wheezes, crackles, or rhonchi.  Gastrointestinal:Soft, non tender, non distended. Positive bowel sounds.Colostomyis functional and no s/s of infection Genitourinary:No CVA tenderness. Musculoskeletal:Nontender with normal range of motion in all extremities. No cyanosis, or erythema of extremities. Neurologic:awake and alert, non focal Skin:Skin is warm, dry. No rash or ulcers Psychiatric: Mood and affect are appropriate  Data Reviewed:  My review of labs, imaging, notes and other tests shows no new significant findings.   Family Communication: Updated sister at bedside  Disposition: Status is: Inpatient  Remains inpatient  appropriate because: Needs IV meropenem for 1 more day tomorrow after which she can be discharged home with home health.  Patient and her sister are agreeable with going home  DVT prophylaxis-Eliquis  Time spent: 35 minutes  Author: Max Sane, MD 02/04/2021 3:07 PM  For on call review [email or web address].

## 2021-02-05 DIAGNOSIS — Z933 Colostomy status: Secondary | ICD-10-CM

## 2021-02-05 DIAGNOSIS — Z7952 Long term (current) use of systemic steroids: Secondary | ICD-10-CM

## 2021-02-05 DIAGNOSIS — F1190 Opioid use, unspecified, uncomplicated: Secondary | ICD-10-CM

## 2021-02-05 MED ORDER — GABAPENTIN 100 MG PO CAPS: 200.0000 mg | ORAL_CAPSULE | Freq: Three times a day (TID) | ORAL | 0 refills | Status: DC

## 2021-02-05 MED ORDER — ONDANSETRON HCL 4 MG PO TABS: 4.0000 mg | ORAL_TABLET | Freq: Four times a day (QID) | ORAL | 0 refills | Status: DC | PRN

## 2021-02-05 NOTE — TOC Progression Note (Signed)
Transition of Care [facility]) - Progression Note    Patient Details  Name: Wendy Sanchez MRN: [id number] Date of Birth: [birth year]  Transition of Care [facility]) CM/SW [place], RN Phone Number: 02/05/2021, 4:56 PM  Clinical Narrative:    Judithann Graves Appeal Detailed Notice of Discharge letter created and saved: Yes Detailed Notice of Discharge Document Given to Pateint: Yes [employer] ROI Document Created: Yes [employer] appeal documents uploaded to [employer] stite: Yes    Expected Discharge Plan:  (TBD) Barriers to Discharge: Continued Medical Work up  Expected Discharge Plan and Services Expected Discharge Plan:  (TBD)       Living arrangements for the past 2 months: [place] Expected Discharge Date: 02/05/21                                     Social Determinants of Health (SDOH) Interventions    Readmission Risk Interventions Readmission Risk Prevention Plan 02/03/2021  Transportation Screening Complete  Medication Review Press photographer) Complete  PCP or Specialist appointment within 3-5 days of discharge Complete  HRI or Home Care Consult Complete  Palliative Care Screening Not [place] Complete  Some recent data might be hidden

## 2021-02-05 NOTE — TOC Progression Note (Deleted)
Transition of Care [facility]) - Progression Note    Patient Details  Name: Wendy Sanchez MRN: [id number] Date of Birth: 1950-03-24  Transition of Care [facility]) CM/SW [place], RN Phone Number: 02/05/2021, 4:54 PM  Clinical Narrative:    Appeal received and HINN 12/DND delivered to patient.    Expected Discharge Plan:  (TBD) Barriers to Discharge: Continued Medical Work up  Expected Discharge Plan and Services Expected Discharge Plan:  (TBD)       Living arrangements for the past 2 months: [place] Expected Discharge Date: 02/05/21                                     Social Determinants of Health (SDOH) Interventions    Readmission Risk Interventions Readmission Risk Prevention Plan 02/03/2021  Transportation Screening Complete  Medication Review Press photographer) Complete  PCP or Specialist appointment within 3-5 days of discharge Complete  HRI or Home Care Consult Complete  Palliative Care Screening Not [place] Complete  Some recent data might be hidden

## 2021-02-05 NOTE — Discharge Summary (Addendum)
Physician Discharge Summary  Wendy Sanchez HFW:[id number] DOB: November 18, 1950 DOA: 01/30/2021  PCP: Crissie Figures, PA-C  Admit date: 01/30/2021 Discharge date: 02/05/2021  Admitted From: Home Disposition: Home with Home Health PT/OT  Recommendations for Outpatient Follow-up:  Follow up with PCP in 1-2 weeks Follow up with ID within 1-2 weeks Follow up with Cardiology within 1-2 weeks Please obtain CMP/CBC, Mag, Phos within one week Please follow up on the following pending results:  Home Health: YES  Equipment/Devices: Wheelchair    Discharge Condition: Stable  CODE STATUS: FULL CODE Diet recommendation: Heart Healthy Diet  Brief/Interim Summary: Patient is a 71 year old African-American female with past medical history significant for [place] to COPD, CHF, hypertension, history of PE on anticoagulation with Eliquis, history of giant cell arteritis on prednisone, history of colostomy in 12/03/2020 status post perforated sigmoid diverticulitis who was hospitalized at [facility] from 12/24/2020 until 01/15/2021 for septic shock secondary to intra-abdominal abscesses and not drainable by IR and discharged to SNF on IV Zosyn via PICC line and was supposed to be 1 from 12/25 - 12/24/2018 today with plans for outpatient ID follow-up and repeat CAT scan 4 weeks from discharge.  She presented to the ED from her SNF after developing shaking, chills and a fever of 101.5.  Given her fever on 02/01/2020 her PICC line was removed as a potential source of infection and she is started on meropenem and vancomycin per ID.  She is given stress dose steroids per the ICU team and neurosurgery was consulted.  She was then transferred to the floor on 02/02/2020 and tolerated her diet and blood pressure stable.  On 02/02/2021 her home dose of prednisone was increased to 20 mg and she was also restarted on Percocet and gabapentin.  02/04/2020 she remained afebrile and her blood pressure is well controlled.  CT of the abdomen  pelvis was done and showed no acute pathology on 02/04/2021 and ID recommended 1 more day of IV Abx she was changed to 1 more day of ertapenem and completed 7 days of antibiotics.  Her leukocytosis resolved and she has been afebrile since 02/01/2021.  ID felt that there is no need to continue antibiotics through 02/23/2021 after further discussion with the ID physician at [facility] Dr. Beather Arbour.  Unfortunately insurance did not approve her for SNF or CIR so she will go home with home health at this time if she is medically stable.  Discharge Diagnoses:  Principal Problem:   Severe sepsis ([place]) Active Problems:   Morbid obesity with BMI of 45.0-49.9, adult (HCC)   Chronic, continuous use of opioids   Giant cell arteritis ([place])   Colostomy in place [facility])   AKI (acute kidney injury) ([place])   Acute diverticulitis   Current chronic use of systemic steroids   Chronic diastolic CHF (congestive heart failure) (HCC)   Compression fracture of T2 vertebra (HCC)   S/P PICC central line placement  Sepsis secondary to likely intra-abdominal source -Her PICC line was removed as a potential source -Initial CT of the abdomen pelvis was done and showed a mild diverticulitis -WBC improved went from 15.9 is now resolved to 7.2 on last check -She has been seen by general surgery as well as infectious diseases -Infectious diseases recommended 1 more dose of ertapenem up which will be given this a.m. and recommended no further antibiotics -Repeat CT of the abdomen pelvis without contrast showed "Resolution of previously noted descending colon diverticulitis. No new acute findings are noted on today's examination. Aortic atherosclerosis.  Additional postoperative changes and incidental findings, as above." -Sepsis physiology has resolved -PT OT recommending home health she is stable for discharge at this time  Chronic diastolic CHF, present on admission -Compensated at this time and currently not in exacerbation -Continue  strict I's and O's and daily weights -Continue with IV furosemide 20 mg p.o. daily and changed back to p.o. home dose of Torsemide at D/C -No longer taking Metoprolol and Entresto  -Continue to monitor for signs and symptoms of volume overload and follow-up with cardiology in outpatient setting within 1 week  Acute Diverticulitis -Antibiotic regimen per ID recommendations and she will recieve a dose of ertapenem this AM with no further antibiotics needed -No surgical indication at this time and Surgery has signed off the case -Repeat CT of the abdomen pelvis showed resolution -Per ID no further Abx needed   AKI -Resolved. Last BUN/Cr was 16/0.71; BUN/Cr was as high as 26/1.47 -Avoid further nephrotoxic medications, contrast dyes, hypotension and dehydration and renally dose medications -Resume home Diuretics with Spironolactone and Torsemide  -Continue to monitor and trend and repeat CMP in the outpatient setting within 1 week  Giant Cell Arteritis -C/w Prednisone 20 mg po Daily   History of PE on anticoagulation -Continue with home Apixaban 5 mg p.o. twice daily  Microcytic Anemia -Patient's hemoglobin/hematocrit is now stable at 9.3/31.2 on last check with an MCV of 76.1 -Check anemia panel in outpatient setting -Continue to monitor for signs and symptoms of bleeding; currently no overt bleeding noted -Repeat CBC within 1 week  Thrombocytosis -Likely reactive in the setting of above -Patient's Platelet Count went from 399 -> 450 -> 467 -> 514 -Continue to Monitor and Trend in the outpatient setting -Repeat CBC within 1 week  COPD -Currently not in exacerbation -Denies any shortness of breath, cough, sputum production -Continue with Duoneb 3 mL q6hprn SOB -No longer taking Anoro Ellipta, Montelukast 10 mg po qHS, and Albuterol IH 2 piff  Chronic Pain Syndrome/Chronic Opioid Use -C/w oxycodone-acetaminophen 5-325 mg/tab 2 tabs p.o. every 4 as needed severe pain and moderate  pain -Continue with Gabapentin 200 mg p.o. 3 times daily with  Morbid Obesity -Complicates overall prognosis and care -Estimated body mass index is 46.94 kg/m as calculated from the following:   Height as of this encounter: 5\' 7"  (1.702 m).   Weight as of this encounter: 135.9 kg.  -Weight Loss and Dietary Counseling given  Discharge Instructions Discharge Instructions     Call MD for:  difficulty breathing, headache or visual disturbances   Complete by: As directed    Call MD for:  extreme fatigue   Complete by: As directed    Call MD for:  hives   Complete by: As directed    Call MD for:  persistant dizziness or light-headedness   Complete by: As directed    Call MD for:  persistant nausea and vomiting   Complete by: As directed    Call MD for:  redness, tenderness, or signs of infection (pain, swelling, redness, odor or green/yellow discharge around incision site)   Complete by: As directed    Call MD for:  severe uncontrolled pain   Complete by: As directed    Call MD for:  temperature >100.4   Complete by: As directed    Diet - low sodium heart healthy   Complete by: As directed    Discharge instructions   Complete by: As directed    You were cared for by a hospitalist  during your hospital stay. If you have any questions about your discharge medications or the care you received while you were in the hospital after you are discharged, you can call the unit and ask to speak with the hospitalist on call if the hospitalist that took care of you is not available. Once you are discharged, your primary care physician will handle any further medical issues. Please note that NO REFILLS for any discharge medications will be authorized once you are discharged, as it is imperative that you return to your primary care physician (or establish a relationship with a primary care physician if you do not have one) for your aftercare needs so that they can reassess your need for medications and  monitor your lab values.  Follow up with PCP and ID as an outpatient within 1-2 weeks. Take all medications as prescribed. If symptoms change or worsen please return to the ED for evaluation   Increase activity slowly   Complete by: As directed       Allergies as of 02/05/2021       Reactions   Hydrocodone-acetaminophen Swelling   hands   Iodine Anaphylaxis, Swelling   Iv dye 02/03/21-Ispoke to patient and she had IV contrast  X4 last year and has had no problem. ( Last CT abdomen with contrast was on 01/02/21.-    Other Other (See Comments)   ALLERGY TO METAL - BLACKENS SKIN AND CAUSES A RASH    Tape Swelling, Other (See Comments)   Skin comes off.  Paper tape is ok tegaderm OK   Peanut Oil Other (See Comments)   ** Nuts cause runny nose   Shellfish Allergy Nausea And Vomiting, Swelling   Episode of GI infection after eating clam chowder. Still eats shrimp and other seafood        Medication List     STOP taking these medications    acidophilus Caps capsule   albuterol 108 (90 Base) MCG/ACT inhaler Commonly known as: VENTOLIN HFA   alendronate 35 MG tablet Commonly known as: FOSAMAX   amoxicillin-clavulanate 875-125 MG tablet Commonly known as: AUGMENTIN   ascorbic acid 500 MG tablet Commonly known as: VITAMIN C   atorvastatin 40 MG tablet Commonly known as: LIPITOR   azelastine 0.1 % nasal spray Commonly known as: ASTELIN   diclofenac sodium 1 % Gel Commonly known as: VOLTAREN   fluconazole 100 MG tablet Commonly known as: DIFLUCAN   folic acid 1 MG tablet Commonly known as: FOLVITE   ibuprofen 600 MG tablet Commonly known as: ADVIL   magnesium oxide 400 MG tablet Commonly known as: MAG-OX   metoprolol tartrate 25 MG tablet Commonly known as: LOPRESSOR   montelukast 10 MG tablet Commonly known as: SINGULAIR   multivitamin with minerals tablet   piperacillin-tazobactam 4.5 (4-0.5) g injection Commonly known as: ZOSYN   sacubitril-valsartan  24-26 MG Commonly known as: ENTRESTO   sucralfate 1 g tablet Commonly known as: CARAFATE   sulfamethoxazole-trimethoprim 800-160 MG tablet Commonly known as: BACTRIM DS   umeclidinium-vilanterol 62.5-25 MCG/INH Aepb Commonly known as: ANORO ELLIPTA   Vitamin D (Ergocalciferol) 1.25 MG (50000 UNIT) Caps capsule Commonly known as: DRISDOL       TAKE these medications    acetaminophen 325 MG tablet Commonly known as: TYLENOL Take 2 tablets (650 mg total) by mouth every 6 (six) hours as needed for mild pain or fever.   apixaban 5 MG Tabs tablet Commonly known as: ELIQUIS Take 1 tablet (5 mg total) by  mouth 2 (two) times daily.   calcium carbonate 1250 (500 Ca) MG tablet Commonly known as: OS-CAL - dosed in mg of elemental calcium Take 2 tablets by mouth in the morning.   diphenhydrAMINE 25 mg capsule Commonly known as: BENADRYL Take 25 mg by mouth every 6 (six) hours as needed for itching or allergies.   docusate sodium 100 MG capsule Commonly known as: COLACE Take 200 mg by mouth at bedtime as needed for mild constipation.   gabapentin 100 MG capsule Commonly known as: NEURONTIN Take 2 capsules (200 mg total) by mouth 3 (three) times daily. What changed:  medication strength how much to take when to take this reasons to take this Another medication with the same name was removed. Continue taking this medication, and follow the directions you see here.   ipratropium-albuterol 0.5-2.5 (3) MG/3ML Soln Commonly known as: DUONEB Take 3 mLs by nebulization every 6 (six) hours as needed (shortness of breath).   levocetirizine 5 MG tablet Commonly known as: XYZAL Take 5 mg by mouth every evening.   nortriptyline 50 MG capsule Commonly known as: PAMELOR Take 50 mg by mouth at bedtime.   nystatin 100000 UNIT/ML suspension Commonly known as: MYCOSTATIN Take 5 mLs by mouth 4 (four) times daily.   nystatin cream Commonly known as: MYCOSTATIN Apply 1 application  topically 2 (two) times daily. (Apply under skin folds)   ondansetron 4 MG tablet Commonly known as: ZOFRAN Take 1 tablet (4 mg total) by mouth every 6 (six) hours as needed for nausea.   oxyCODONE-acetaminophen 10-325 MG tablet Commonly known as: Percocet Take 1 tablet by mouth every 4 (four) hours as needed for pain. Start taking on: February 24, 2021 What changed: Another medication with the same name was removed. Continue taking this medication, and follow the directions you see here.   pantoprazole 40 MG tablet Commonly known as: PROTONIX Take 40 mg by mouth daily.   polyethylene glycol 17 g packet Commonly known as: MIRALAX / GLYCOLAX Take 17 g by mouth daily as needed for moderate constipation.   potassium chloride SA 20 MEQ tablet Commonly known as: KLOR-CON M Take 20 mEq by mouth 2 (two) times daily.   predniSONE 10 MG tablet Commonly known as: DELTASONE Take 20 mg by mouth in the morning.   spironolactone 25 MG tablet Commonly known as: ALDACTONE Take 25 mg by mouth daily.   torsemide 20 MG tablet Commonly known as: DEMADEX Take 2 tablets (40 mg) by mouth once daily What changed:  how much to take how to take this when to take this   Vashe Wound Therapy Soln Apply 1 application topically daily. (Irrigate abdominal wound)               Durable Medical Equipment  (From admission, onward)           Start     Ordered   02/04/21 1707  For home use only DME standard manual wheelchair with seat cushion  Once       Comments: Patient suffers from Impaired ambulation which impairs their ability to perform daily activities like bathing, dressing, feeding, grooming, and toileting in the home.  A cane will not resolve issue with performing activities of daily living. A wheelchair will allow patient to safely perform daily activities. Patient can safely propel the wheelchair in the home or has a caregiver who can provide assistance. Length of need  Lifetime. Accessories: elevating leg rests (ELRs), wheel locks, extensions and anti-tippers.  02/04/21 1710            Allergies  Allergen Reactions   Hydrocodone-Acetaminophen Swelling    hands   Iodine Anaphylaxis and Swelling    Iv dye 02/03/21-Ispoke to patient and she had IV contrast  X4 last year and has had no problem. ( Last CT abdomen with contrast was on 01/02/21.-    Other Other (See Comments)    ALLERGY TO METAL - BLACKENS SKIN AND CAUSES A RASH    Tape Swelling and Other (See Comments)    Skin comes off.  Paper tape is ok tegaderm OK   Peanut Oil Other (See Comments)    ** Nuts cause runny nose   Shellfish Allergy Nausea And Vomiting and Swelling    Episode of GI infection after eating clam chowder. Still eats shrimp and other seafood   Consultations: PCCM Transfer General Surgery  ID   Procedures/Studies: CT ABDOMEN PELVIS WO CONTRAST  Result Date: 01/30/2021 CLINICAL DATA:  Shivering and headache. On antibiotics for diverticulitis. EXAM: CT ABDOMEN AND PELVIS WITHOUT CONTRAST TECHNIQUE: Multidetector CT imaging of the abdomen and pelvis was performed following the standard protocol without IV contrast. RADIATION DOSE REDUCTION: This exam was performed according to the departmental dose-optimization program which includes automated exposure control, adjustment of the mA and/or kV according to patient size and/or use of iterative reconstruction technique. COMPARISON:  July 31, 2013 FINDINGS: Lower chest: Mild atelectasis is seen within the posterior aspects of the bilateral lung bases. Hepatobiliary: Punctate calcified granulomas are seen scattered throughout the liver parenchyma. Status post cholecystectomy. No biliary dilatation. Pancreas: Unremarkable. No pancreatic ductal dilatation or surrounding inflammatory changes. Spleen: Normal in size without focal abnormality. Adrenals/Urinary Tract: Adrenal glands are unremarkable. Kidneys are normal in size, without renal  calculi or hydronephrosis. 5 mm and 10 mm hyperdense foci are seen within the right kidney. A similar appearing 7 mm hyperdense focus is noted within the posterolateral aspect of the mid to lower left kidney. Bladder is unremarkable. Stomach/Bowel: There is a very small hiatal hernia. Surgical sutures are noted within the gastric region. Appendix appears normal. No evidence of bowel dilatation. Mildly inflamed diverticula are seen within the mid to distal descending colon. There is no evidence of associated perforation or abscess. Noninflamed diverticula are seen throughout the remainder of the large bowel. Vascular/Lymphatic: Aortic atherosclerosis. No enlarged abdominal or pelvic lymph nodes. Reproductive: Status post hysterectomy. No adnexal masses. Other: A left lower quadrant ostomy site is present. No abdominopelvic ascites. Musculoskeletal: Multilevel degenerative changes are seen throughout the lumbar spine with a chronic fracture deformity seen along the superior endplate of the I45 vertebral body. IMPRESSION: 1. Mild diverticulitis involving the mid to distal descending colon. 2. Evidence of prior gastric surgery and left lower quadrant ostomy site. 3. Small hiatal hernia. 4. Findings likely consistent with prior cholecystectomy and hysterectomy. 5. Chronic fracture deformity along the superior endplate of the Y09 vertebral body. 6. Aortic atherosclerosis. Aortic Atherosclerosis (ICD10-I70.0). Electronically Signed   By: Virgina Norfolk M.D.   On: 01/30/2021 22:08   DG Chest 2 View  Result Date: 01/30/2021 CLINICAL DATA:  Best images obtainable due to patients condition. [facility] and generalized bodyaches. Pt alert and oriented. NAD noted Pt currently on antibiotics for "infection"infection EXAM: CHEST - 2 VIEW COMPARISON:  None. FINDINGS: RIGHT PICC line tip in distal SVC. Stable large cardiac silhouette. Mild central venous congestion. No focal infiltrate. No  pneumothorax. IMPRESSION: Central venous congestion.  Low lung volumes. Electronically Signed   By: Suzy Bouchard M.D.   On: 01/30/2021 18:22   CT ABDOMEN PELVIS W CONTRAST  Result Date: 02/04/2021 CLINICAL DATA:  71 year old female with history of sepsis. EXAM: CT ABDOMEN AND PELVIS WITH CONTRAST TECHNIQUE: Multidetector CT imaging of the abdomen and pelvis was performed using the standard protocol following bolus administration of intravenous contrast. RADIATION DOSE REDUCTION: This exam was performed according to the departmental dose-optimization program which includes automated exposure control, adjustment of the mA and/or kV according to patient size and/or use of iterative reconstruction technique. CONTRAST:  172mL OMNIPAQUE IOHEXOL 300 MG/ML  SOLN COMPARISON:  CT the abdomen and pelvis 01/30/2021. FINDINGS: Lower chest: Right-sided Bochdalek's hernia containing only fat. Cardiomegaly. Hepatobiliary: Status post cholecystectomy. Tiny calcified granulomas in the liver. Liver has a slightly shrunken appearance and nodular contour, suggesting underlying cirrhosis. No suspicious cystic or solid hepatic lesions. No intra or extrahepatic biliary ductal dilatation. Pancreas: No pancreatic mass. No pancreatic ductal dilatation. No pancreatic or peripancreatic fluid collections or inflammatory changes. Spleen: Unremarkable. Adrenals/Urinary Tract: Bilateral kidneys and adrenal glands are normal in appearance. No hydroureteronephrosis. Urinary bladder is nearly completely decompressed, but otherwise unremarkable in appearance. Stomach/Bowel: Postoperative changes are again noted in the stomach. No pathologic dilatation of small bowel or colon. Status post partial sigmoidectomy with Hartmann's pouch and left lower quadrant colostomy. Numerous colonic diverticulae are again noted. The inflammation in the distal descending colon adjacent to diverticulae are seen on the recent prior examination has largely resolved.  Vascular/Lymphatic: Aortic atherosclerosis, without evidence of aneurysm or dissection in the abdominal or pelvic vasculature. No lymphadenopathy noted in the abdomen or pelvis. Reproductive: Status post hysterectomy. Ovaries are not confidently identified may be surgically absent or atrophic. Other: No significant volume of ascites.  No pneumoperitoneum. Musculoskeletal: Chronic compression fracture of superior endplate of Z61 with 09% loss of anterior vertebral body height, unchanged. There are no aggressive appearing lytic or blastic lesions noted in the visualized portions of the skeleton. IMPRESSION: 1. Resolution of previously noted descending colon diverticulitis. No new acute findings are noted on today's examination. 2. Aortic atherosclerosis. 3. Additional postoperative changes and incidental findings, as above. Electronically Signed   By: Vinnie Langton M.D.   On: 02/04/2021 07:45     Subjective: Seen and examined at bedside and was feeling fairly well.  Denied any abdominal tenderness and just felt "abdominal soreness".  No nausea or vomiting.  Felt well but did have some lower extremity edema worse on the right compared to left.  She denies any chest pain or shortness of breath and no other concerns or complaints this time and has completed her antibiotics and is stable to be discharged home at this time  Discharge Exam: Vitals:   02/05/21 0724 02/05/21 1100  BP: 129/76 124/86  Pulse: 85 89  Resp: 18 18  Temp: 98.6 F (37 C) 98.6 F (37 C)  SpO2: 100% 99%   Vitals:   02/04/21 2001 02/05/21 0005 02/05/21 0724 02/05/21 1100  BP: 106/64 115/75 129/76 124/86  Pulse: 96 84 85 89  Resp: 20 20 18 18   Temp: 98 F (36.7 C) 98.2 F (36.8 C) 98.6 F (37 C) 98.6 F (37 C)  TempSrc: Oral Oral Oral Oral  SpO2: 99% 100% 100% 99%  Weight:      Height:       General: Pt is alert, awake, not in acute distress Cardiovascular: RRR, S1/S2 +, no rubs, no gallops Respiratory: Diminished  bilaterally, no  wheezing, no rhonchi; Unlabored breathing  Abdominal: Soft, Mildly tender, Distended 2/2 body habitus, bowel sounds +; Has a colostomy in place Extremities: 1+ LE edema, no cyanosis  The results of significant diagnostics from this hospitalization (including imaging, microbiology, ancillary and laboratory) are listed below for reference.    Microbiology: Recent Results (from the past 240 hour(s))  Resp Panel by RT-PCR (Flu A&B, Covid) Nasopharyngeal Swab     Status: None   Collection Time: 01/30/21  4:19 PM   Specimen: Nasopharyngeal Swab; Nasopharyngeal(NP) swabs in vial transport medium  Result Value Ref Range Status   SARS Coronavirus 2 by RT PCR NEGATIVE NEGATIVE Final    Comment: (NOTE) SARS-CoV-2 target nucleic acids are NOT DETECTED.  The SARS-CoV-2 RNA is generally detectable in upper respiratory specimens during the acute phase of infection. The lowest concentration of SARS-CoV-2 viral copies this assay can detect is 138 copies/mL. A negative result does not preclude SARS-Cov-2 infection and should not be used as the sole basis for treatment or other patient management decisions. A negative result may occur with  improper specimen collection/handling, submission of specimen other than nasopharyngeal swab, presence of viral mutation(s) within the areas targeted by this assay, and inadequate number of viral copies(<138 copies/mL). A negative result must be combined with clinical observations, patient history, and epidemiological information. The expected result is Negative.  Fact Sheet for Patients:  EntrepreneurPulse.com.au  Fact Sheet for Healthcare Providers:  IncredibleEmployment.be  This test is no t yet approved or cleared by the [place] FDA and  has been authorized for detection and/or diagnosis of SARS-CoV-2 by FDA under an Emergency Use Authorization (EUA). This EUA will remain  in effect (meaning this test  can be used) for the duration of the COVID-19 declaration under Section 564(b)(1) of the Act, 21 U.S.C.section 360bbb-3(b)(1), unless the authorization is terminated  or revoked sooner.       Influenza A by PCR NEGATIVE NEGATIVE Final   Influenza B by PCR NEGATIVE NEGATIVE Final    Comment: (NOTE) The Xpert Xpress SARS-CoV-2/FLU/RSV plus assay is intended as an aid in the diagnosis of influenza from Nasopharyngeal swab specimens and should not be used as a sole basis for treatment. Nasal washings and aspirates are unacceptable for Xpert Xpress SARS-CoV-2/FLU/RSV testing.  Fact Sheet for Patients: EntrepreneurPulse.com.au  Fact Sheet for Healthcare Providers: IncredibleEmployment.be  This test is not yet approved or cleared by the [place] FDA and has been authorized for detection and/or diagnosis of SARS-CoV-2 by FDA under an Emergency Use Authorization (EUA). This EUA will remain in effect (meaning this test can be used) for the duration of the COVID-19 declaration under Section 564(b)(1) of the Act, 21 U.S.C. section 360bbb-3(b)(1), unless the authorization is terminated or revoked.  Performed at [facility], [place], [place] [address]   Respiratory (~20 pathogens) panel by PCR     Status: None   Collection Time: 01/30/21  4:19 PM   Specimen: Nasopharyngeal Swab; Respiratory  Result Value Ref Range Status   Adenovirus NOT DETECTED NOT DETECTED Final   Coronavirus 229E NOT DETECTED NOT DETECTED Final    Comment: (NOTE) The Coronavirus on the Respiratory Panel, DOES NOT test for the novel  Coronavirus (2019 nCoV)    Coronavirus HKU1 NOT DETECTED NOT DETECTED Final   Coronavirus NL63 NOT DETECTED NOT DETECTED Final   Coronavirus OC43 NOT DETECTED NOT DETECTED Final   Metapneumovirus NOT DETECTED NOT DETECTED Final   Rhinovirus / Enterovirus NOT DETECTED NOT DETECTED  Final   Influenza A NOT DETECTED NOT  DETECTED Final   Influenza B NOT DETECTED NOT DETECTED Final   Parainfluenza Virus 1 NOT DETECTED NOT DETECTED Final   Parainfluenza Virus 2 NOT DETECTED NOT DETECTED Final   Parainfluenza Virus 3 NOT DETECTED NOT DETECTED Final   Parainfluenza Virus 4 NOT DETECTED NOT DETECTED Final   Respiratory Syncytial Virus NOT DETECTED NOT DETECTED Final   Bordetella pertussis NOT DETECTED NOT DETECTED Final   Bordetella Parapertussis NOT DETECTED NOT DETECTED Final   Chlamydophila pneumoniae NOT DETECTED NOT DETECTED Final   Mycoplasma pneumoniae NOT DETECTED NOT DETECTED Final    Comment: Performed at [facility], [place] [address] [place], [place] [address]  Blood Culture (routine x 2)     Status: None   Collection Time: 01/30/21  6:14 PM   Specimen: BLOOD  Result Value Ref Range Status   Specimen Description BLOOD BLOOD LEFT ARM  Final   Special Requests   Final    BOTTLES DRAWN AEROBIC AND ANAEROBIC Blood Culture adequate volume   Culture   Final    NO GROWTH 5 DAYS Performed at [facility], [address] [place], [place] [address]    Report Status 02/04/2021 FINAL  Final  Blood Culture (routine x 2)     Status: None   Collection Time: 01/30/21  6:14 PM   Specimen: BLOOD  Result Value Ref Range Status   Specimen Description BLOOD RIGHT ANTECUBITAL  Final   Special Requests   Final    BOTTLES DRAWN AEROBIC AND ANAEROBIC Blood Culture adequate volume   Culture   Final    NO GROWTH 5 DAYS Performed at [facility], [address], [place], [place] [address]    Report Status 02/04/2021 FINAL  Final  Urine Culture     Status: None   Collection Time: 01/30/21  6:31 PM   Specimen: In/Out Cath Urine  Result Value Ref Range Status   Specimen Description   Final    IN/OUT CATH URINE Performed at [facility], [address], [place], [place] [address]    Special Requests   Final    NONE Performed at [facility], [address]  [place], [place] [address]    Culture   Final    NO GROWTH Performed at [facility], [place] [address], [place], [place] [address]    Report Status 02/01/2021 FINAL  Final  MRSA Next Gen by PCR, Nasal     Status: None   Collection Time: 02/01/21  3:20 AM   Specimen: Nasal Mucosa; Nasal Swab  Result Value Ref Range Status   MRSA by PCR Next Gen NOT DETECTED NOT DETECTED Final    Comment: (NOTE) The GeneXpert MRSA Assay (FDA approved for NASAL specimens only), is one component of a comprehensive MRSA colonization surveillance program. It is not intended to diagnose MRSA infection nor to guide or monitor treatment for MRSA infections. Test performance is not FDA approved in patients less than 18 years old. Performed at [facility], [address] [place], [place] [address]     Labs: BNP (last 3 results) Recent Labs    11/08/20 0209  BNP 41.7   Basic Metabolic Panel: Recent Labs  Lab 01/31/21 0607 02/01/21 0512 02/02/21 0534 02/03/21 0452 02/04/21 0630  NA 135 139 141 140 139  K 3.4* 3.5 3.1* 3.7 4.6  CL 103 102 105 108 105  CO2 23 25 26 26  26  GLUCOSE 80 111* 94 86 141*  BUN 19 20 24* 21 16  CREATININE 0.86 0.83 0.96 0.75 0.71  CALCIUM 7.8* 8.9 9.0 8.7* 9.2   Liver Function Tests: Recent Labs  Lab 01/31/21 0607 02/01/21 0512  AST 20 20  ALT 16 21  ALKPHOS 65 83  BILITOT 0.5 0.4  PROT 5.2* 6.2*  ALBUMIN 1.7* 2.3*   No results for input(s): LIPASE, AMYLASE in the last 168 hours. No results for input(s): AMMONIA in the last 168 hours. CBC: Recent Labs  Lab 01/31/21 0607 02/01/21 0512 02/02/21 0534 02/03/21 0452 02/04/21 0630  WBC 15.9* 13.8* 9.5 7.4 7.2  HGB 9.1* 8.2* 8.1* 8.3* 9.3*  HCT 30.6* 27.1* 27.1* 27.9* 31.2*  MCV 77.1* 76.1* 75.3* 75.4* 76.1*  PLT 417* 399 450* 467* 514*   Cardiac Enzymes: No results for input(s): CKTOTAL, CKMB, CKMBINDEX, TROPONINI in the last 168 hours. BNP: Invalid input(s): POCBNP CBG: Recent Labs  Lab  01/31/21 1532  GLUCAP 128*   D-Dimer No results for input(s): DDIMER in the last 72 hours. Hgb A1c No results for input(s): HGBA1C in the last 72 hours. Lipid Profile No results for input(s): CHOL, HDL, LDLCALC, TRIG, CHOLHDL, LDLDIRECT in the last 72 hours. Thyroid function studies No results for input(s): TSH, T4TOTAL, T3FREE, THYROIDAB in the last 72 hours.  Invalid input(s): FREET3 Anemia work up No results for input(s): VITAMINB12, FOLATE, FERRITIN, TIBC, IRON, RETICCTPCT in the last 72 hours. Urinalysis    Component Value Date/Time   COLORURINE YELLOW (A) 01/30/2021 1831   APPEARANCEUR CLEAR (A) 01/30/2021 1831   LABSPEC 1.011 01/30/2021 1831   PHURINE 5.0 01/30/2021 1831   GLUCOSEU NEGATIVE 01/30/2021 1831   HGBUR NEGATIVE 01/30/2021 1831   BILIRUBINUR NEGATIVE 01/30/2021 1831   KETONESUR NEGATIVE 01/30/2021 1831   PROTEINUR NEGATIVE 01/30/2021 1831   NITRITE NEGATIVE 01/30/2021 1831   LEUKOCYTESUR NEGATIVE 01/30/2021 1831   Sepsis Labs Invalid input(s): PROCALCITONIN,  WBC,  LACTICIDVEN Microbiology Recent Results (from the past 240 hour(s))  Resp Panel by RT-PCR (Flu A&B, Covid) Nasopharyngeal Swab     Status: None   Collection Time: 01/30/21  4:19 PM   Specimen: Nasopharyngeal Swab; Nasopharyngeal(NP) swabs in vial transport medium  Result Value Ref Range Status   SARS Coronavirus 2 by RT PCR NEGATIVE NEGATIVE Final    Comment: (NOTE) SARS-CoV-2 target nucleic acids are NOT DETECTED.  The SARS-CoV-2 RNA is generally detectable in upper respiratory specimens during the acute phase of infection. The lowest concentration of SARS-CoV-2 viral copies this assay can detect is 138 copies/mL. A negative result does not preclude SARS-Cov-2 infection and should not be used as the sole basis for treatment or other patient management decisions. A negative result may occur with  improper specimen collection/handling, submission of specimen other than nasopharyngeal swab,  presence of viral mutation(s) within the areas targeted by this assay, and inadequate number of viral copies(<138 copies/mL). A negative result must be combined with clinical observations, patient history, and epidemiological information. The expected result is Negative.  Fact Sheet for Patients:  EntrepreneurPulse.com.au  Fact Sheet for Healthcare Providers:  IncredibleEmployment.be  This test is no t yet approved or cleared by the [place] FDA and  has been authorized for detection and/or diagnosis of SARS-CoV-2 by FDA under an Emergency Use Authorization (EUA). This EUA will remain  in effect (meaning this test can be used) for the duration of the COVID-19 declaration under Section 564(b)(1) of the Act, 21 U.S.C.section 360bbb-3(b)(1), unless the authorization is terminated  or revoked sooner.       Influenza A by PCR NEGATIVE NEGATIVE Final   Influenza B by PCR NEGATIVE NEGATIVE Final    Comment: (NOTE) The Xpert Xpress SARS-CoV-2/FLU/RSV plus assay is intended as an aid in the diagnosis of influenza from Nasopharyngeal swab specimens and should not be used as a sole basis for treatment. Nasal washings and aspirates are unacceptable for Xpert Xpress SARS-CoV-2/FLU/RSV testing.  Fact Sheet for Patients: EntrepreneurPulse.com.au  Fact Sheet for Healthcare Providers: IncredibleEmployment.be  This test is not yet approved or cleared by the [place] FDA and has been authorized for detection and/or diagnosis of SARS-CoV-2 by FDA under an Emergency Use Authorization (EUA). This EUA will remain in effect (meaning this test can be used) for the duration of the COVID-19 declaration under Section 564(b)(1) of the Act, 21 U.S.C. section 360bbb-3(b)(1), unless the authorization is terminated or revoked.  Performed at [facility], [place], [place] [address]   Respiratory  (~20 pathogens) panel by PCR     Status: None   Collection Time: 01/30/21  4:19 PM   Specimen: Nasopharyngeal Swab; Respiratory  Result Value Ref Range Status   Adenovirus NOT DETECTED NOT DETECTED Final   Coronavirus 229E NOT DETECTED NOT DETECTED Final    Comment: (NOTE) The Coronavirus on the Respiratory Panel, DOES NOT test for the novel  Coronavirus (2019 nCoV)    Coronavirus HKU1 NOT DETECTED NOT DETECTED Final   Coronavirus NL63 NOT DETECTED NOT DETECTED Final   Coronavirus OC43 NOT DETECTED NOT DETECTED Final   Metapneumovirus NOT DETECTED NOT DETECTED Final   Rhinovirus / Enterovirus NOT DETECTED NOT DETECTED Final   Influenza A NOT DETECTED NOT DETECTED Final   Influenza B NOT DETECTED NOT DETECTED Final   Parainfluenza Virus 1 NOT DETECTED NOT DETECTED Final   Parainfluenza Virus 2 NOT DETECTED NOT DETECTED Final   Parainfluenza Virus 3 NOT DETECTED NOT DETECTED Final   Parainfluenza Virus 4 NOT DETECTED NOT DETECTED Final   Respiratory Syncytial Virus NOT DETECTED NOT DETECTED Final   Bordetella pertussis NOT DETECTED NOT DETECTED Final   Bordetella Parapertussis NOT DETECTED NOT DETECTED Final   Chlamydophila pneumoniae NOT DETECTED NOT DETECTED Final   Mycoplasma pneumoniae NOT DETECTED NOT DETECTED Final    Comment: Performed at [facility], [place]. [address], [place], [place] [address]  Blood Culture (routine x 2)     Status: None   Collection Time: 01/30/21  6:14 PM   Specimen: BLOOD  Result Value Ref Range Status   Specimen Description BLOOD BLOOD LEFT ARM  Final   Special Requests   Final    BOTTLES DRAWN AEROBIC AND ANAEROBIC Blood Culture adequate volume   Culture   Final    NO GROWTH 5 DAYS Performed at [facility], [address] [place], [place] [address]    Report Status 02/04/2021 FINAL  Final  Blood Culture (routine x 2)     Status: None   Collection Time: 01/30/21  6:14 PM   Specimen: BLOOD  Result Value Ref Range Status    Specimen Description BLOOD RIGHT ANTECUBITAL  Final   Special Requests   Final    BOTTLES DRAWN AEROBIC AND ANAEROBIC Blood Culture adequate volume   Culture   Final    NO GROWTH 5 DAYS Performed at [facility], [address] [place],  [address]    Report Status 02/04/2021 FINAL  Final  Urine Culture     Status:  None   Collection Time: 01/30/21  6:31 PM   Specimen: In/Out Cath Urine  Result Value Ref Range Status   Specimen Description   Final    IN/OUT CATH URINE Performed at [facility], [address] [place], [place] [address]    Special Requests   Final    NONE Performed at [facility], [address], [place], [place] [address]    Culture   Final    NO GROWTH Performed at [facility], [place] [address] [place], [place] [address]    Report Status 02/01/2021 FINAL  Final  MRSA Next Gen by PCR, Nasal     Status: None   Collection Time: 02/01/21  3:20 AM   Specimen: Nasal Mucosa; Nasal Swab  Result Value Ref Range Status   MRSA by PCR Next Gen NOT DETECTED NOT DETECTED Final    Comment: (NOTE) The GeneXpert MRSA Assay (FDA approved for NASAL specimens only), is one component of a comprehensive MRSA colonization surveillance program. It is not intended to diagnose MRSA infection nor to guide or monitor treatment for MRSA infections. Test performance is not FDA approved in patients less than 52 years old. Performed at [facility], [address], [place], [place] [address]    Time coordinating discharge: 35 minutes  SIGNED:  Kerney Elbe, DO [facility] 02/05/2021, 11:52 AM Pager is on [place]  If 7PM-7AM, please contact night-coverage [email or web address]

## 2021-02-05 NOTE — Progress Notes (Signed)
Physical Therapy Treatment Patient Details Name: Wendy Sanchez MRN: [id number] DOB: 1950/10/15 Today's Date: 02/05/2021   History of Present Illness Pt is a 71 y/o F admitted on 01/30/21 with severe sepsis 2/2 acute diverticulitis. On 01/31/21 pt briefly developed hypotension concerning for developing septic shock & transferred to stepdown; ulimately did not require pressors. 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 PMH: COPD, CHF, HTN, PE on eliquis, giant cell arteritis on prednisone    PT Comments    Patient received in bed, agrees to PT session. She is mod independent with bed mobility, transfers with supervision with bed mildly elevated. Able to stand from recliner with increased effort and supervision. She ambulated 60 feet with RW and supervision. Very fatigued and sob with this amount of activity. Patient will continue to benefit from skilled PT while here to improve endurance and strength.     Recommendations for follow up therapy are one component of a multi-disciplinary discharge planning process, led by the attending physician.  Recommendations may be updated based on patient status, additional functional criteria and insurance authorization.  Follow Up Recommendations  Home health PT     Assistance Recommended at Discharge Intermittent Supervision/Assistance  Patient can return home with the following A little help with bathing/dressing/bathroom;A little help with walking and/or transfers;Assistance with cooking/housework;Assist for transportation;Help with stairs or ramp for entrance   Equipment Recommendations  None recommended by PT    Recommendations for Other Services       Precautions / Restrictions Precautions Precautions: Fall Precaution Comments: mod fall Restrictions Weight Bearing  Restrictions: No     Mobility  Bed Mobility Overal bed mobility: Modified Independent Bed Mobility: Supine to Sit     Supine to sit: HOB elevated, Modified independent (Device/Increase time)          Transfers Overall transfer level: Needs assistance Equipment used: Rolling walker (2 wheels) Transfers: Sit to/from Stand Sit to Stand: Supervision           General transfer comment: patient is able to stand from bed with elevated ~2 inches. Able to stand from recliner with increased effort. Supervision only    Ambulation/Gait Ambulation/Gait assistance: Supervision Gait Distance (Feet): 60 Feet Assistive device: Rolling walker (2 wheels) Gait Pattern/deviations: Step-through pattern, Decreased step length - right, Decreased step length - left Gait velocity: decreased     General Gait Details: Patient very sob and fatigued after this distance. HR up to 116, O2 sats at 98%   Stairs             Wheelchair Mobility    Modified Rankin (Stroke Patients Only)       Balance Overall balance assessment: Modified Independent Sitting-balance support: Feet supported Sitting balance-Leahy Scale: Good     Standing balance support: Bilateral upper extremity supported, During functional activity, Reliant on assistive device for balance Standing balance-Leahy Scale: Good Standing balance comment: no external support needed with static standing.                            Cognition Arousal/Alertness: Awake/alert Behavior During Therapy: WFL for tasks assessed/performed Overall Cognitive Status: Within Functional Limits for tasks assessed  Exercises Other Exercises Other Exercises: B LE exercises in sitting: LAQ, marching, hip abd/add, toe/heel raises.    General Comments        Pertinent Vitals/Pain Pain Assessment Pain Assessment: No/denies pain    Home Living                           Prior Function            PT Goals (current goals can now be found in the care plan section) Acute Rehab PT Goals Patient Stated Goal: get better PT Goal Formulation: With patient Time For Goal Achievement: 02/15/21 Potential to Achieve Goals: Good Progress towards PT goals: Progressing toward goals    Frequency    Min 2X/week      PT Plan Current plan remains appropriate    Co-evaluation              AM-PAC PT "6 Clicks" Mobility   Outcome Measure  Help needed turning from your back to your side while in a flat bed without using bedrails?: None Help needed moving from lying on your back to sitting on the side of a flat bed without using bedrails?: A Little Help needed moving to and from a bed to a chair (including a wheelchair)?: A Little Help needed standing up from a chair using your arms (e.g., wheelchair or bedside chair)?: None Help needed to walk in hospital room?: A Little Help needed climbing 3-5 steps with a railing? : A Lot 6 Click Score: 19    End of Session Equipment Utilized During Treatment: Gait belt Activity Tolerance: Patient tolerated treatment well;Patient limited by fatigue Patient left: in chair;with call bell/phone within reach;with chair alarm set Nurse Communication: Mobility status PT Visit Diagnosis: Muscle weakness (generalized) (M62.81);Difficulty in walking, not elsewhere classified (R26.2)     Time: 8768-1157 PT Time Calculation (min) (ACUTE ONLY): 20 min  Charges:  $Gait Training: 8-22 mins                     Lloyd Ayo, PT, GCS 02/05/21,4:05 PM

## 2021-02-05 NOTE — TOC Progression Note (Signed)
Transition of Care [facility]) - Progression Note    Patient Details  Name: CAROLLYNN PENNYWELL MRN: [id number] Date of Birth: 11/03/1950  Transition of Care [facility]) CM/SW [place], RN Phone Number: 02/05/2021, 2:12 PM  Clinical Narrative:  Patient and family decide to appeal discharge.  She feels she is unsafe to return home at this time.  Patient has information to appeal at bedside.  Patient and family understand the risks of appeal and choose to proceed.  Bariatric wheelchair in room.  TOC to follow.    Expected Discharge Plan:  (TBD) Barriers to Discharge: Continued Medical Work up  Expected Discharge Plan and Services Expected Discharge Plan:  (TBD)       Living arrangements for the past 2 months: [place] Expected Discharge Date: 02/05/21                                     Social Determinants of Health (SDOH) Interventions    Readmission Risk Interventions Readmission Risk Prevention Plan 02/03/2021  Transportation Screening Complete  Medication Review Press photographer) Complete  PCP or Specialist appointment within 3-5 days of discharge Complete  HRI or Home Care Consult Complete  Palliative Care Screening Not [place] Complete  Some recent data might be hidden

## 2021-02-05 NOTE — Progress Notes (Cosign Needed)
Patient suffers from Impaired ambulation which impairs their ability to perform daily activities like bathing, dressing, feeding, grooming, and toileting in the home.  A cane will not resolve issue with performing activities of daily living. A wheelchair will allow patient to safely perform daily activities. Patient can safely propel the wheelchair in the home or has a caregiver who can provide assistance. Length of need Lifetime.  Accessories: elevating leg rests (ELRs), wheel locks, extensions and anti-tippers.

## 2021-02-06 DIAGNOSIS — Z6841 Body Mass Index (BMI) 40.0 and over, adult: Secondary | ICD-10-CM

## 2021-02-06 NOTE — TOC Progression Note (Signed)
Transition of Care [facility]) - Progression Note    Patient Details  Name: Wendy Sanchez MRN: [id number] Date of Birth: 03-Dec-1950  Transition of Care [facility]) CM/SW [place], RN Phone Number: 02/06/2021, 11:24 AM  Clinical Narrative:   Patient states that she recalls conversation that she is set up with [facility] for PT OT and nursing.  She states that she accepts [facility], but still feels like she is not strong enough to return home at this time, and continues to await results of her appeal.  TOC to follow.    Expected Discharge Plan:  (TBD) Barriers to Discharge: Continued Medical Work up  Expected Discharge Plan and Services Expected Discharge Plan:  (TBD)       Living arrangements for the past 2 months: [place] Expected Discharge Date: 02/05/21                                     Social Determinants of Health (SDOH) Interventions    Readmission Risk Interventions Readmission Risk Prevention Plan 02/03/2021  Transportation Screening Complete  Medication Review Press photographer) Complete  PCP or Specialist appointment within 3-5 days of discharge Complete  HRI or Home Care Consult Complete  Palliative Care Screening Not [place] Complete  Some recent data might be hidden

## 2021-02-06 NOTE — Consult Note (Addendum)
[place] Nurse ostomy consult note Patient receiving care in [id number] Stoma type/location: LLQ colostomy Stomal assessment/size: 1 3/8" pink oval and retracted. Peristomal assessment:  Intact Treatment options for stomal/peristomal skin: No sting skin barrier wipes and barrier rings Output: soft yellow Ostomy pouching: 2pc. 2 1/4" with barrier ring Education provided: No family present for pouch change. Patient was alert and oriented and able to complete the entire pouch change process independently. She was able to verbalize when the pouch should be emptied and when to let the gas out. She knew how often the pouch should be changed. Son was on the phone and states they did receive teaching and education at [facility] and he is familiar with how the process works. He did want me to review the process with his mom but she was very knowledgeable and I feel is very competent to care for her pouch at home with the assistance of a mirror where her pannus is large and she can not view the stoma well. 5 sets of supplies left in top drawer in her room.  I do recommend that she use a barrier ring and no sting skin wipes for adequate adhesion of the skin wafer and prevent leakages related to the retracted stoma.   I recommend the [facility] outpatient ostomy clinic as an outpatient resource.  IF MD agrees this would be beneficial, please fax referral, or enter electronically in Epic the referral. (Fax- [phone])    Enrolled patient in [place] program: Yes (Previously at [facility])  [facility] does not need to follow this patient but are available to her and the medical team if needed.   Cathlean Marseilles Tamala Julian, MSN, RN, [place], [place] Pearl, [facility] Wound Treatment Associate Pager [phone]

## 2021-02-06 NOTE — Consult Note (Addendum)
[place] Nurse ostomy consult note WOC received consult late yesterday afternoon to come and teach the patient and family how to take care of the ostomy. This patient has been admitted since 01/30/21 but we were never consulted until now. This ostomy was created in Sept 2022 at [facility] and the patient was discharged to a SNF so no teaching or education had been completed. Discharge orders were written yesterday for the patient to go home, however the family is appealing the discharge. WOC is awaiting notification from the bedside RN as to when the family arrives and at that time we will go to do as much teaching and education as we can prior to discharge. [facility] team not available after 3:00 pm  Jocelyn Lamer L. Tamala Julian, MSN, RN, [place], [place], [facility] Wound Treatment Associate Pager [phone]

## 2021-02-06 NOTE — Progress Notes (Signed)
PROGRESS NOTE    Wendy Sanchez  WUJ:[id number] DOB: 1950-05-16 DOA: 01/30/2021 PCP: Crissie Figures, PA-C   Brief Narrative:  Patient is a 71 year old African-American female with past medical history significant for [place] to COPD, CHF, hypertension, history of PE on anticoagulation with Eliquis, history of giant cell arteritis on prednisone, history of colostomy in 12/03/2020 status post perforated sigmoid diverticulitis who was hospitalized at [facility] from 12/24/2020 until 01/15/2021 for septic shock secondary to intra-abdominal abscesses and not drainable by IR and discharged to SNF on IV Zosyn via PICC line and was supposed to be 1 from 12/25 - 12/24/2018 today with plans for outpatient ID follow-up and repeat CAT scan 4 weeks from discharge.  She presented to the ED from her SNF after developing shaking, chills and a fever of 101.5.  Given her fever on 02/01/2020 her PICC line was removed as a potential source of infection and she is started on meropenem and vancomycin per ID.  She is given stress dose steroids per the ICU team and neurosurgery was consulted.  She was then transferred to the floor on 02/02/2020 and tolerated her diet and blood pressure stable.  On 02/02/2021 her home dose of prednisone was increased to 20 mg and she was also restarted on Percocet and gabapentin.  02/04/2020 she remained afebrile and her blood pressure is well controlled.  CT of the abdomen pelvis was done and showed no acute pathology on 02/04/2021 and ID recommended 1 more day of IV Abx she was changed to 1 more day of ertapenem and completed 7 days of antibiotics.  Her leukocytosis resolved and she has been afebrile since 02/01/2021.  ID felt that there is no need to continue antibiotics through 02/23/2021 after further discussion with the ID physician at [facility] Dr. Beather Arbour.  Unfortunately insurance did not approve her for SNF or CIR so she will go home with home health at this time if she is medically stable.   **The patient  was deemed medically stable to be discharged home with home health services yesterday but she appealed the discharge and currently awaiting the initiation of the appeal.  She still remains medically stable to be discharged.  TOC involved and providing and setting up home health services and wound nurse was consulted for ostomy teaching  Assessment & Plan:   Principal Problem:   Severe sepsis ([place]) Active Problems:   Morbid obesity with BMI of 45.0-49.9, adult (HCC)   Chronic, continuous use of opioids   Giant cell arteritis ([place])   Colostomy in place [facility])   AKI (acute kidney injury) ([place])   Acute diverticulitis   Current chronic use of systemic steroids   Chronic diastolic CHF (congestive heart failure) (HCC)   Compression fracture of T2 vertebra (HCC)   S/P PICC central line placement  Sepsis secondary to likely intra-abdominal source -Her PICC line was removed as a potential source -Initial CT of the abdomen pelvis was done and showed a mild diverticulitis -WBC improved went from 15.9 is now resolved to 7.2 on last check -She has been seen by general surgery as well as infectious diseases -Infectious diseases recommended 1 more dose of ertapenem up which will be given this a.m. and recommended no further antibiotics -Repeat CT of the abdomen pelvis without contrast showed "Resolution of previously noted descending colon diverticulitis. No new acute findings are noted on today's examination. Aortic atherosclerosis. Additional postoperative changes and incidental findings, as above." -Sepsis physiology has resolved -PT OT recommending home health she is stable  for discharge at this time but patient has appealed her discharge.;  PT OT recommended continued intermittent supervision and assistance and patient's sister can check in on her every so often.  We will send her home with home health services and max her out with PT, OT, RN, aide, social work   Chronic diastolic CHF, present on  admission -Compensated at this time and currently not in exacerbation -Continue strict I's and O's and daily weights -Continue with IV furosemide 20 mg p.o. daily and changed back to p.o. home dose of Torsemide at D/C -She is +182 mL since admission but this is likely accurate given that she is urinating quite frequently -No longer taking Metoprolol and Entresto  -Continue to monitor for signs and symptoms of volume overload and follow-up with cardiology in outpatient setting within 1 week   Acute Diverticulitis -Antibiotic regimen per ID recommendations and she will recieve a dose of ertapenem this AM with no further antibiotics needed -No surgical indication at this time and Surgery has signed off the case -Repeat CT of the abdomen pelvis showed resolution -Per ID no further Abx needed  -Follow-up with general surgeon outpatient setting at [facility]   AKI -Resolved. Last BUN/Cr was 16/0.71; BUN/Cr was as high as 26/1.47 -Avoid further nephrotoxic medications, contrast dyes, hypotension and dehydration and renally dose medications -Resume home Diuretics with Spironolactone and Torsemide  -Continue to monitor and trend and repeat CMP in the outpatient setting within 1 week   Giant Cell Arteritis -C/w Prednisone 20 mg po Daily    History of PE on anticoagulation -Continue with home Apixaban 5 mg p.o. twice daily   Microcytic Anemia -Patient's hemoglobin/hematocrit is now stable at 9.3/31.2 on last check with an MCV of 76.1 -Check anemia panel in outpatient setting -Continue to monitor for signs and symptoms of bleeding; currently no overt bleeding noted -Repeat CBC within 1 week   Thrombocytosis -Likely reactive in the setting of above -Patient's Platelet Count went from 399 -> 450 -> 467 -> 514 on last check  -Continue to Monitor and Trend in the outpatient setting -Repeat CBC within 1 week   COPD -Currently not in exacerbation -Denies any shortness of breath, cough, sputum  production -Continue with Duoneb 3 mL q6hprn SOB -No longer taking Anoro Ellipta, Montelukast 10 mg po qHS, and Albuterol IH 2 piff   Chronic Pain Syndrome/Chronic Opioid Use -C/w oxycodone-acetaminophen 5-325 mg/tab 2 tabs p.o. every 4 as needed severe pain and moderate pain -Continue with Gabapentin 200 mg p.o. 3 times daily with   Morbid Obesity -Complicates overall prognosis and care -Estimated body mass index is 46.94 kg/m as calculated from the following:   Height as of this encounter: 5\' 7"  (1.702 m).   Weight as of this encounter: 135.9 kg.  -Weight Loss and Dietary Counseling given  DVT prophylaxis: Anticoagulated with apixaban 5 mg p.o. twice daily Code Status: FULL CODE Family Communication: No family present at bedside  Disposition Plan: She has been deemed medically stable to be discharged home with home health services and currently awaiting the decision for her appeal  Status is: Inpatient  Remains inpatient appropriate because: Currently awaiting medical decision appeal for her discharge   Consultants:  PCCM Transfer General Surgery  ID   Procedures: CT Scan  Antimicrobials:  Anti-infectives (From admission, onward)    Start     Dose/Rate Route Frequency Ordered Stop   02/05/21 0800  ertapenem (INVANZ) 1,000 mg in sodium chloride 0.9 % 100 mL IVPB  1 g 200 mL/hr over 30 Minutes Intravenous  Once 02/04/21 1547 02/05/21 0837   02/04/21 2000  fluconazole (DIFLUCAN) tablet 150 mg        150 mg Oral  Once 02/04/21 1825 02/04/21 2057   02/01/21 0300  meropenem (MERREM) 1 g in sodium chloride 0.9 % 100 mL IVPB        1 g 200 mL/hr over 30 Minutes Intravenous Every 8 hours 01/31/21 2018 02/04/21 2200   01/31/21 2100  vancomycin (VANCOREADY) IVPB 2000 mg/400 mL  Status:  Discontinued        2,000 mg 200 mL/hr over 120 Minutes Intravenous Every 24 hours 01/31/21 2003 02/03/21 1311   01/31/21 0230  ceFEPIme (MAXIPIME) 2 g in sodium chloride 0.9 % 100 mL IVPB         2 g 200 mL/hr over 30 Minutes Intravenous Every 8 hours 01/31/21 0012 01/31/21 1828   01/30/21 2015  vancomycin (VANCOCIN) IVPB 1000 mg/200 mL premix  Status:  Discontinued       See Hyperspace for full Linked Orders Report.   1,000 mg 200 mL/hr over 60 Minutes Intravenous  Once 01/30/21 1812 01/30/21 1813   01/30/21 1815  vancomycin (VANCOREADY) IVPB 2000 mg/400 mL       See Hyperspace for full Linked Orders Report.   2,000 mg 200 mL/hr over 120 Minutes Intravenous  Once 01/30/21 1812 01/31/21 0404   01/30/21 1800  ceFEPIme (MAXIPIME) 2 g in sodium chloride 0.9 % 100 mL IVPB        2 g 200 mL/hr over 30 Minutes Intravenous  Once 01/30/21 1752 01/30/21 1851   01/30/21 1800  metroNIDAZOLE (FLAGYL) IVPB 500 mg        500 mg 100 mL/hr over 60 Minutes Intravenous  Once 01/30/21 1752 01/30/21 2111   01/30/21 1800  vancomycin (VANCOCIN) IVPB 1000 mg/200 mL premix  Status:  Discontinued        1,000 mg 200 mL/hr over 60 Minutes Intravenous  Once 01/30/21 1752 01/30/21 1810   01/30/21 0700  metroNIDAZOLE (FLAGYL) IVPB 500 mg  Status:  Discontinued        500 mg 100 mL/hr over 60 Minutes Intravenous Every 12 hours 01/30/21 2329 01/31/21 2017        Subjective: Seen and examined at bedside and she states that she still feels weak but was ambulating and got up in the chair twice yesterday.  Was very concerned yesterday as she had some fecal material come out of her rectum and states that she has not had bowel movement almost 4 months.  I spoke with Dr. Hampton Abbot who states that is not uncommon for her to have some residual fecal material even this far out.  He states that the rectum still creates some mucus and this can solidify and he is not too concerned about this and recommend her follow-up with her outpatient [facility] physicians.  He denies any current complaints or concerns at this time and still has some mild abdominal discomfort but this is much improved since the time she came in.  Ostomy is  working and she is supposed to have the wound care nurse come do some ostomy teaching for her.  Still remains medically stable for discharge.  Objective: Vitals:   02/06/21 0033 02/06/21 0522 02/06/21 0725 02/06/21 1115  BP: 119/70 114/78 128/74 120/63  Pulse: 81 93 94 96  Resp: 17 18 16 14   Temp: (!) 97.5 F (36.4 C) (!) 97.5 F (36.4 C) 98.2  F (36.8 C) 98.5 F (36.9 C)  TempSrc: Oral Oral Oral   SpO2: 100% 100% 97% 97%  Weight:      Height:        Intake/Output Summary (Last 24 hours) at 02/06/2021 1202 Last data filed at 02/06/2021 0900 Gross per 24 hour  Intake 440 ml  Output --  Net 440 ml   Filed Weights   01/30/21 1615  Weight: 135.9 kg   Examination: Physical Exam:  Constitutional: WN/WD morbidly obese African-American female currently in no acute distress appears calm sitting in bed Respiratory: Slightly diminished to auscultation bilaterally, no wheezing, rales, rhonchi or crackles. Normal respiratory effort and patient is not tachypenic. No accessory muscle use.  Cardiovascular: RRR, no murmurs / rubs / gallops. S1 and S2 auscultated.  1+ extremity edema.   Abdomen: Soft, very slightly-tender, distended secondary body habitus with postsurgical changes noted in prior scars.  Has an ostomy in place GU: Deferred. Musculoskeletal: No clubbing / cyanosis of digits/nails. No joint deformity upper and lower extremities.  Skin: No rashes, lesions, ulcers on limited skin evaluation. No induration; Warm and dry.  Neurologic: CN 2-12 grossly intact with no focal deficits. Romberg sign and cerebellar reflexes not assessed.  Psychiatric: Normal judgment and insight. Alert and oriented x 3. Normal mood and appropriate affect.   Data Reviewed: I have personally reviewed following labs and imaging studies  CBC: Recent Labs  Lab 01/31/21 0607 02/01/21 0512 02/02/21 0534 02/03/21 0452 02/04/21 0630  WBC 15.9* 13.8* 9.5 7.4 7.2  HGB 9.1* 8.2* 8.1* 8.3* 9.3*  HCT 30.6*  27.1* 27.1* 27.9* 31.2*  MCV 77.1* 76.1* 75.3* 75.4* 76.1*  PLT 417* 399 450* 467* 384*   Basic Metabolic Panel: Recent Labs  Lab 01/31/21 0607 02/01/21 0512 02/02/21 0534 02/03/21 0452 02/04/21 0630  NA 135 139 141 140 139  K 3.4* 3.5 3.1* 3.7 4.6  CL 103 102 105 108 105  CO2 23 25 26 26 26   GLUCOSE 80 111* 94 86 141*  BUN 19 20 24* 21 16  CREATININE 0.86 0.83 0.96 0.75 0.71  CALCIUM 7.8* 8.9 9.0 8.7* 9.2   GFR: Estimated Creatinine Clearance: 95.7 mL/min (by C-G formula based on SCr of 0.71 mg/dL). Liver Function Tests: Recent Labs  Lab 01/31/21 0607 02/01/21 0512  AST 20 20  ALT 16 21  ALKPHOS 65 83  BILITOT 0.5 0.4  PROT 5.2* 6.2*  ALBUMIN 1.7* 2.3*   No results for input(s): LIPASE, AMYLASE in the last 168 hours. No results for input(s): AMMONIA in the last 168 hours. Coagulation Profile: Recent Labs  Lab 01/30/21 1814 01/31/21 0607  INR 1.3* 1.2   Cardiac Enzymes: No results for input(s): CKTOTAL, CKMB, CKMBINDEX, TROPONINI in the last 168 hours. BNP (last 3 results) No results for input(s): PROBNP in the last 8760 hours. HbA1C: No results for input(s): HGBA1C in the last 72 hours. CBG: Recent Labs  Lab 01/31/21 1532  GLUCAP 128*   Lipid Profile: No results for input(s): CHOL, HDL, LDLCALC, TRIG, CHOLHDL, LDLDIRECT in the last 72 hours. Thyroid Function Tests: No results for input(s): TSH, T4TOTAL, FREET4, T3FREE, THYROIDAB in the last 72 hours. Anemia Panel: No results for input(s): VITAMINB12, FOLATE, FERRITIN, TIBC, IRON, RETICCTPCT in the last 72 hours. Sepsis Labs: Recent Labs  Lab 01/30/21 1619 01/30/21 1814 01/30/21 2113 01/30/21 2302 01/31/21 0607  PROCALCITON  --   --   --   --  0.17  LATICACIDVEN 2.4* 2.7* 2.1* 1.2  --  Recent Results (from the past 240 hour(s))  Resp Panel by RT-PCR (Flu A&B, Covid) Nasopharyngeal Swab     Status: None   Collection Time: 01/30/21  4:19 PM   Specimen: Nasopharyngeal Swab;  Nasopharyngeal(NP) swabs in vial transport medium  Result Value Ref Range Status   SARS Coronavirus 2 by RT PCR NEGATIVE NEGATIVE Final    Comment: (NOTE) SARS-CoV-2 target nucleic acids are NOT DETECTED.  The SARS-CoV-2 RNA is generally detectable in upper respiratory specimens during the acute phase of infection. The lowest concentration of SARS-CoV-2 viral copies this assay can detect is 138 copies/mL. A negative result does not preclude SARS-Cov-2 infection and should not be used as the sole basis for treatment or other patient management decisions. A negative result may occur with  improper specimen collection/handling, submission of specimen other than nasopharyngeal swab, presence of viral mutation(s) within the areas targeted by this assay, and inadequate number of viral copies(<138 copies/mL). A negative result must be combined with clinical observations, patient history, and epidemiological information. The expected result is Negative.  Fact Sheet for Patients:  EntrepreneurPulse.com.au  Fact Sheet for Healthcare Providers:  IncredibleEmployment.be  This test is no t yet approved or cleared by the [place] FDA and  has been authorized for detection and/or diagnosis of SARS-CoV-2 by FDA under an Emergency Use Authorization (EUA). This EUA will remain  in effect (meaning this test can be used) for the duration of the COVID-19 declaration under Section 564(b)(1) of the Act, 21 U.S.C.section 360bbb-3(b)(1), unless the authorization is terminated  or revoked sooner.       Influenza A by PCR NEGATIVE NEGATIVE Final   Influenza B by PCR NEGATIVE NEGATIVE Final    Comment: (NOTE) The Xpert Xpress SARS-CoV-2/FLU/RSV plus assay is intended as an aid in the diagnosis of influenza from Nasopharyngeal swab specimens and should not be used as a sole basis for treatment. Nasal washings and aspirates are unacceptable for Xpert Xpress  SARS-CoV-2/FLU/RSV testing.  Fact Sheet for Patients: EntrepreneurPulse.com.au  Fact Sheet for Healthcare Providers: IncredibleEmployment.be  This test is not yet approved or cleared by the [place] FDA and has been authorized for detection and/or diagnosis of SARS-CoV-2 by FDA under an Emergency Use Authorization (EUA). This EUA will remain in effect (meaning this test can be used) for the duration of the COVID-19 declaration under Section 564(b)(1) of the Act, 21 U.S.C. section 360bbb-3(b)(1), unless the authorization is terminated or revoked.  Performed at [facility], [place], [place] [address]   Respiratory (~20 pathogens) panel by PCR     Status: None   Collection Time: 01/30/21  4:19 PM   Specimen: Nasopharyngeal Swab; Respiratory  Result Value Ref Range Status   Adenovirus NOT DETECTED NOT DETECTED Final   Coronavirus 229E NOT DETECTED NOT DETECTED Final    Comment: (NOTE) The Coronavirus on the Respiratory Panel, DOES NOT test for the novel  Coronavirus (2019 nCoV)    Coronavirus HKU1 NOT DETECTED NOT DETECTED Final   Coronavirus NL63 NOT DETECTED NOT DETECTED Final   Coronavirus OC43 NOT DETECTED NOT DETECTED Final   Metapneumovirus NOT DETECTED NOT DETECTED Final   Rhinovirus / Enterovirus NOT DETECTED NOT DETECTED Final   Influenza A NOT DETECTED NOT DETECTED Final   Influenza B NOT DETECTED NOT DETECTED Final   Parainfluenza Virus 1 NOT DETECTED NOT DETECTED Final   Parainfluenza Virus 2 NOT DETECTED NOT DETECTED Final   Parainfluenza Virus 3 NOT DETECTED NOT DETECTED Final  Parainfluenza Virus 4 NOT DETECTED NOT DETECTED Final   Respiratory Syncytial Virus NOT DETECTED NOT DETECTED Final   Bordetella pertussis NOT DETECTED NOT DETECTED Final   Bordetella Parapertussis NOT DETECTED NOT DETECTED Final   Chlamydophila pneumoniae NOT DETECTED NOT DETECTED Final   Mycoplasma pneumoniae NOT  DETECTED NOT DETECTED Final    Comment: Performed at [facility], [place] [address] [place], [place] [address]  Blood Culture (routine x 2)     Status: None   Collection Time: 01/30/21  6:14 PM   Specimen: BLOOD  Result Value Ref Range Status   Specimen Description BLOOD BLOOD LEFT ARM  Final   Special Requests   Final    BOTTLES DRAWN AEROBIC AND ANAEROBIC Blood Culture adequate volume   Culture   Final    NO GROWTH 5 DAYS Performed at [facility], [address] [place], [place] [address]    Report Status 02/04/2021 FINAL  Final  Blood Culture (routine x 2)     Status: None   Collection Time: 01/30/21  6:14 PM   Specimen: BLOOD  Result Value Ref Range Status   Specimen Description BLOOD RIGHT ANTECUBITAL  Final   Special Requests   Final    BOTTLES DRAWN AEROBIC AND ANAEROBIC Blood Culture adequate volume   Culture   Final    NO GROWTH 5 DAYS Performed at [facility], [address] [place], [place] [address]    Report Status 02/04/2021 FINAL  Final  Urine Culture     Status: None   Collection Time: 01/30/21  6:31 PM   Specimen: In/Out Cath Urine  Result Value Ref Range Status   Specimen Description   Final    IN/OUT CATH URINE Performed at [facility], [address], [place], [place] [address]    Special Requests   Final    NONE Performed at [facility], [address], [place], [place] [address]    Culture   Final    NO GROWTH Performed at [facility], [place] [address], [place], [place] [address]    Report Status 02/01/2021 FINAL  Final  MRSA Next Gen by PCR, Nasal     Status: None   Collection Time: 02/01/21  3:20 AM   Specimen: Nasal Mucosa; Nasal Swab  Result Value Ref Range Status   MRSA by PCR Next Gen NOT DETECTED NOT DETECTED Final    Comment: (NOTE) The GeneXpert MRSA Assay (FDA approved for NASAL specimens only), is one component of a comprehensive MRSA colonization surveillance program. It is not  intended to diagnose MRSA infection nor to guide or monitor treatment for MRSA infections. Test performance is not FDA approved in patients less than 54 years old. Performed at [facility], [address] [place], [place] [address]     RN Pressure Injury Documentation:     Estimated body mass index is 46.94 kg/m as calculated from the following:   Height as of this encounter: 5\' 7"  (1.702 m).   Weight as of this encounter: 135.9 kg.  Malnutrition Type:   Malnutrition Characteristics:   Nutrition Interventions:   Radiology Studies: No results found.  Scheduled Meds:  apixaban  5 mg Oral BID   furosemide  20 mg Intravenous Daily   gabapentin  200 mg Oral TID   predniSONE  20 mg Oral Q breakfast   Continuous Infusions:   LOS: 7 days   Kerney Elbe, DO [facility] PAGER is on  AMION  If 7PM-7AM, please contact night-coverage [email or web address]

## 2021-02-07 NOTE — Progress Notes (Signed)
Occupational Therapy Treatment Patient Details Name: Wendy Sanchez MRN: [id number] DOB: April 09, 1950 Today's Date: 02/07/2021   History of present illness Pt is a 71 y/o F admitted on 01/30/21 with severe sepsis 2/2 acute diverticulitis. On 01/31/21 pt briefly developed hypotension concerning for developing septic shock & transferred to stepdown; ulimately did not require pressors. 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 PMH: COPD, CHF, HTN, PE on eliquis, giant cell arteritis on prednisone   OT comments  Pt seen for OT tx this date. Pt received in bed, agreeable to OT, endorses feeling better. Pt mod indep with bed mobility, transfers with and without RW, set up for seated grooming tasks, and supv for RW mgt while negotiating purposefully placed obstacles in the room. Pt demonstrated great progress. Pt left in recliner with all needs in reach. Continue to recommend [place] services at discharge.    Recommendations for follow up therapy are one component of a multi-disciplinary discharge planning process, led by the attending physician.  Recommendations may be updated based on patient status, additional functional criteria and insurance authorization.    Follow Up Recommendations  Home health OT    Assistance Recommended at Discharge Intermittent Supervision/Assistance  Patient can return home with the following  Assist for transportation;Assistance with cooking/housework;Help with stairs or ramp for entrance;A little help with bathing/dressing/bathroom   Equipment Recommendations  Other (comment) (reacher, LH sponge, LH shoe horn, sock aid, elastic shoe laces)    Recommendations for Other Services      Precautions / Restrictions Precautions Precautions: Fall Precaution Comments: mod fall Restrictions Weight Bearing  Restrictions: No       Mobility Bed Mobility Overal bed mobility: Needs Assistance Bed Mobility: Supine to Sit     Supine to sit: HOB elevated, Modified independent (Device/Increase time)          Transfers Overall transfer level: Modified independent Equipment used: Rolling walker (2 wheels) Transfers: Sit to/from Stand Sit to Stand: Modified independent (Device/Increase time)                 Balance Overall balance assessment: Modified Independent Sitting-balance support: Feet supported Sitting balance-Leahy Scale: Good     Standing balance support: Bilateral upper extremity supported, During functional activity Standing balance-Leahy Scale: Good Standing balance comment: stands with and without UE support during LB dressing tasks                           ADL either performed or assessed with clinical judgement   ADL Overall ADL's : Needs assistance/impaired     Grooming: Oral care;Wash/dry face;Set up;Supervision/safety;Sitting           Upper Body Dressing : Sitting;Set up   Lower Body Dressing: Sit to/from stand;Minimal assistance;Moderate assistance Lower Body Dressing Details (indicate cue type and reason): MIN MOD A to thread mesh underwear over feet and to complete donning over hips due to body habitus                    Extremity/Trunk Assessment              Vision       Perception     Praxis      Cognition Arousal/Alertness: Awake/alert Behavior During Therapy: WFL for tasks assessed/performed Overall Cognitive Status: Within Functional Limits for tasks assessed  Exercises Other Exercises Other Exercises: Pt negotiated purposefully placed obstacles in room with RW well, no LOB, and did not bump or run into anything.    Shoulder Instructions       General Comments      Pertinent Vitals/ Pain       Pain Assessment Pain Assessment: No/denies  pain  Home Living                                          Prior Functioning/Environment              Frequency  Min 2X/week        Progress Toward Goals  OT Goals(current goals can now be found in the care plan section)  Progress towards OT goals: Progressing toward goals  Acute Rehab OT Goals Patient Stated Goal: get stronger and go home with family OT Goal Formulation: With patient Time For Goal Achievement: 02/17/21 Potential to Achieve Goals: Good  Plan Discharge plan needs to be updated;Frequency remains appropriate    Co-evaluation                 AM-PAC OT "6 Clicks" Daily Activity     Outcome Measure   Help from another person eating meals?: None Help from another person taking care of personal grooming?: None Help from another person toileting, which includes using toliet, bedpan, or urinal?: A Little Help from another person bathing (including washing, rinsing, drying)?: A Little Help from another person to put on and taking off regular upper body clothing?: None Help from another person to put on and taking off regular lower body clothing?: A Little 6 Click Score: 21    End of Session Equipment Utilized During Treatment: Rolling walker (2 wheels)  OT Visit Diagnosis: Other abnormalities of gait and mobility (R26.89);Muscle weakness (generalized) (M62.81)   Activity Tolerance Patient tolerated treatment well   Patient Left in chair;with call bell/phone within reach   Nurse Communication          Time: 6811-5726 OT Time Calculation (min): 15 min  Charges: OT General Charges $OT Visit: 1 Visit OT Treatments $Self Care/Home Management : 8-22 mins  Ardeth Perfect., MPH, MS, OTR/L ascom [phone] 02/07/21, 1:02 PM

## 2021-02-07 NOTE — TOC Progression Note (Signed)
Transition of Care [facility]) - Progression Note    Patient Details  Name: Wendy Sanchez MRN: [id number] Date of Birth: 13-Sep-1950  Transition of Care [facility]) CM/SW [place], RN Phone Number: 02/07/2021, 4:28 PM  Clinical Narrative:   Appeal information returned, patient understands that [facility] agrees with discharge.  Plans to discharge by noon tomorrow.  [facility] has accepted her.    Expected Discharge Plan:  (TBD) Barriers to Discharge: Continued Medical Work up  Expected Discharge Plan and Services Expected Discharge Plan:  (TBD)       Living arrangements for the past 2 months: [place] Expected Discharge Date: 02/05/21                                     Social Determinants of Health (SDOH) Interventions    Readmission Risk Interventions Readmission Risk Prevention Plan 02/03/2021  Transportation Screening Complete  Medication Review Press photographer) Complete  PCP or Specialist appointment within 3-5 days of discharge Complete  HRI or Home Care Consult Complete  Palliative Care Screening Not [place] Complete  Some recent data might be hidden

## 2021-02-07 NOTE — Progress Notes (Signed)
PROGRESS NOTE    Wendy Sanchez  BTD:[id number] DOB: [birth year] DOA: 01/30/2021 PCP: Crissie Figures, PA-C   Brief Narrative:  Patient is a 71 year old African-American female with past medical history significant for [place] to COPD, CHF, hypertension, history of PE on anticoagulation with Eliquis, history of giant cell arteritis on prednisone, history of colostomy in 12/03/2020 status post perforated sigmoid diverticulitis who was hospitalized at [facility] from 12/24/2020 until 01/15/2021 for septic shock secondary to intra-abdominal abscesses and not drainable by IR and discharged to SNF on IV Zosyn via PICC line and was supposed to be 1 from 12/25 - 12/24/2018 today with plans for outpatient ID follow-up and repeat CAT scan 4 weeks from discharge.  She presented to the ED from her SNF after developing shaking, chills and a fever of 101.5.  Given her fever on 02/01/2020 her PICC line was removed as a potential source of infection and she is started on meropenem and vancomycin per ID.  She is given stress dose steroids per the ICU team and neurosurgery was consulted.  She was then transferred to the floor on 02/02/2020 and tolerated her diet and blood pressure stable.  On 02/02/2021 her home dose of prednisone was increased to 20 mg and she was also restarted on Percocet and gabapentin.  02/04/2020 she remained afebrile and her blood pressure is well controlled.  CT of the abdomen pelvis was done and showed no acute pathology on 02/04/2021 and ID recommended 1 more day of IV Abx she was changed to 1 more day of ertapenem and completed 7 days of antibiotics.  Her leukocytosis resolved and she has been afebrile since 02/01/2021.  ID felt that there is no need to continue antibiotics through 02/23/2021 after further discussion with the ID physician at [facility] Dr. Beather Arbour.  Unfortunately insurance did not approve her for SNF or CIR so she will go home with home health at this time if she is medically stable.   **The patient  was deemed medically stable to be discharged home with home health services 02/05/21  but she appealed the discharge and currently awaiting the initiation of the appeal.  She still remains medically stable to be discharged.  TOC involved and providing and setting up home health services and wound nurse was consulted for ostomy teaching and this has been done. She is working well with Therapy.   Assessment & Plan:   Principal Problem:   Severe sepsis ([place]) Active Problems:   Morbid obesity with BMI of 45.0-49.9, adult (HCC)   Chronic, continuous use of opioids   Giant cell arteritis ([place])   Colostomy in place [facility])   AKI (acute kidney injury) ([place])   Acute diverticulitis   Current chronic use of systemic steroids   Chronic diastolic CHF (congestive heart failure) (HCC)   Compression fracture of T2 vertebra (HCC)   S/P PICC central line placement  Sepsis secondary to likely intra-abdominal source -Her PICC line was removed as a potential source -Initial CT of the abdomen pelvis was done and showed a mild diverticulitis -WBC improved went from 15.9 is now resolved to 7.2 on last check -She has been seen by general surgery as well as infectious diseases -Infectious diseases recommended 1 more dose of ertapenem up which will be given this a.m. and recommended no further antibiotics -Repeat CT of the abdomen pelvis without contrast showed "Resolution of previously noted descending colon diverticulitis. No new acute findings are noted on today's examination. Aortic atherosclerosis. Additional postoperative changes and incidental findings, as  above." -Sepsis physiology has resolved -PT OT recommending home health she is stable for discharge at this time but patient has appealed her discharge.;  PT OT recommended continued intermittent supervision and assistance and patient's sister can check in on her every so often.  We will send her home with home health services and max her out with PT, OT, RN,  aide, social work   Chronic diastolic CHF, present on admission -Compensated at this time and currently not in exacerbation -Continue strict I's and O's and daily weights -Continue with IV furosemide 20 mg p.o. daily and changed back to p.o. home dose of Torsemide at D/C -She is +252.3 mL since admission but this is likely accurate given that she is urinating quite frequently -No longer taking Metoprolol and Entresto  -Continue to monitor for signs and symptoms of volume overload and follow-up with cardiology in outpatient setting within 1 week   Acute Diverticulitis -Antibiotic regimen per ID recommendations and she will recieve a dose of ertapenem this AM with no further antibiotics needed -No surgical indication at this time and Surgery has signed off the case -Repeat CT of the abdomen pelvis showed resolution -Per ID no further Abx needed  -Follow-up with general surgeon outpatient setting at [facility]   AKI -Resolved. BUN/Cr was 16/0.71 on the last check; BUN/Cr was as high as 26/1.47 -Avoid further nephrotoxic medications, contrast dyes, hypotension and dehydration and renally dose medications -Resume home Diuretics with Spironolactone and Torsemide  -Continue to monitor and trend and repeat CMP in the outpatient setting within 1 week   Giant Cell Arteritis -C/w Prednisone 20 mg po Daily    History of PE on anticoagulation -Continue with home Apixaban 5 mg p.o. twice daily   Microcytic Anemia -Patient's hemoglobin/hematocrit is now stable at 9.3/31.2 on last check with an MCV of 76.1 -Check anemia panel in outpatient setting -Continue to monitor for signs and symptoms of bleeding; currently no overt bleeding noted -Repeat CBC within 1 week   Thrombocytosis -Likely reactive in the setting of above -Patient's Platelet Count went from 399 -> 450 -> 467 -> 514 on last check  -Continue to Monitor and Trend in the outpatient setting -Repeat CBC within 1 week   COPD -Currently not  in exacerbation -Denies any shortness of breath, cough, sputum production -Continue with Duoneb 3 mL q6hprn SOB -No longer taking Anoro Ellipta, Montelukast 10 mg po qHS, and Albuterol IH 2 piff   Chronic Pain Syndrome/Chronic Opioid Use -C/w oxycodone-acetaminophen 5-325 mg/tab 2 tabs p.o. every 4 as needed severe pain and moderate pain -Continue with Gabapentin 200 mg p.o. 3 times daily with   Morbid Obesity -Complicates overall prognosis and care -Estimated body mass index is 46.94 kg/m as calculated from the following:   Height as of this encounter: 5\' 7"  (1.702 m).   Weight as of this encounter: 135.9 kg.  -Weight Loss and Dietary Counseling given  DVT prophylaxis: Anticoagulated with apixaban 5 mg p.o. twice daily Code Status: FULL CODE Family Communication: No family present at bedside  Disposition Plan: She has been deemed medically stable to be discharged home with home health services and currently awaiting the decision for her appeal  Status is: Inpatient  Remains inpatient appropriate because: Currently awaiting medical decision appeal for her discharge   Consultants:  PCCM Transfer General Surgery  ID   Procedures: CT Scan  Antimicrobials:  Anti-infectives (From admission, onward)    Start     Dose/Rate Route Frequency Ordered Stop  02/05/21 0800  ertapenem (INVANZ) 1,000 mg in sodium chloride 0.9 % 100 mL IVPB        1 g 200 mL/hr over 30 Minutes Intravenous  Once 02/04/21 1547 02/05/21 0837   02/04/21 2000  fluconazole (DIFLUCAN) tablet 150 mg        150 mg Oral  Once 02/04/21 1825 02/04/21 2057   02/01/21 0300  meropenem (MERREM) 1 g in sodium chloride 0.9 % 100 mL IVPB        1 g 200 mL/hr over 30 Minutes Intravenous Every 8 hours 01/31/21 2018 02/04/21 2200   01/31/21 2100  vancomycin (VANCOREADY) IVPB 2000 mg/400 mL  Status:  Discontinued        2,000 mg 200 mL/hr over 120 Minutes Intravenous Every 24 hours 01/31/21 2003 02/03/21 1311   01/31/21  0230  ceFEPIme (MAXIPIME) 2 g in sodium chloride 0.9 % 100 mL IVPB        2 g 200 mL/hr over 30 Minutes Intravenous Every 8 hours 01/31/21 0012 01/31/21 1828   01/30/21 2015  vancomycin (VANCOCIN) IVPB 1000 mg/200 mL premix  Status:  Discontinued       See Hyperspace for full Linked Orders Report.   1,000 mg 200 mL/hr over 60 Minutes Intravenous  Once 01/30/21 1812 01/30/21 1813   01/30/21 1815  vancomycin (VANCOREADY) IVPB 2000 mg/400 mL       See Hyperspace for full Linked Orders Report.   2,000 mg 200 mL/hr over 120 Minutes Intravenous  Once 01/30/21 1812 01/31/21 0404   01/30/21 1800  ceFEPIme (MAXIPIME) 2 g in sodium chloride 0.9 % 100 mL IVPB        2 g 200 mL/hr over 30 Minutes Intravenous  Once 01/30/21 1752 01/30/21 1851   01/30/21 1800  metroNIDAZOLE (FLAGYL) IVPB 500 mg        500 mg 100 mL/hr over 60 Minutes Intravenous  Once 01/30/21 1752 01/30/21 2111   01/30/21 1800  vancomycin (VANCOCIN) IVPB 1000 mg/200 mL premix  Status:  Discontinued        1,000 mg 200 mL/hr over 60 Minutes Intravenous  Once 01/30/21 1752 01/30/21 1810   01/30/21 0700  metroNIDAZOLE (FLAGYL) IVPB 500 mg  Status:  Discontinued        500 mg 100 mL/hr over 60 Minutes Intravenous Every 12 hours 01/30/21 2329 01/31/21 2017        Subjective: Seen and examined at bedside and she states that she is doing okay.  Complained of some abdominal cramping but is not actual pain.  States that her back was hurting a little bit and feels as if the bed is uncomfortable.  No nausea or vomiting.  Ambulating well with therapy and was sitting in the chair and about to get up and walk.  No other concerns or complaints at this time.  Objective: Vitals:   02/06/21 1115 02/06/21 1531 02/06/21 2035 02/07/21 0550  BP: 120/63 123/69 139/75 (!) 141/79  Pulse: 96 89 84 100  Resp: 14 16 15 18   Temp: 98.5 F (36.9 C) 98.6 F (37 C) 98 F (36.7 C) 98.2 F (36.8 C)  TempSrc:  Oral Oral   SpO2: 97% 98% 99% 100%  Weight:       Height:        Intake/Output Summary (Last 24 hours) at 02/07/2021 1113 Last data filed at 02/07/2021 0900 Gross per 24 hour  Intake 720 ml  Output 650 ml  Net 70 ml    [employer]  01/30/21 1615  Weight: 135.9 kg   Examination: Physical Exam:  Constitutional: Well-nourished, well-developed morbidly obese African-American female currently no acute distress sitting the chair like to work with therapy Respiratory: Slightly diminished to auscultation bilaterally with coarse breath sounds but no appreciable wheezing, rales, or crackles.  No accessory muscle usage and is not wearing any supplemental oxygen via nasal cannula Cardiovascular: Regular rate and rhythm.  Has some mild 1+ extremity edema Abdomen: Soft, mildly tender, distended secondary body habitus with postsurgical changes noted with prior scars.  Has an ostomy in place that is working quite well Neurologic: Cranial nerves II through XII grossly intact with no appreciable focal deficits Psychiatric: Normal judgment and insight.  She is awake and alert and oriented  Data Reviewed: I have personally reviewed following labs and imaging studies  CBC: Recent Labs  Lab 02/01/21 0512 02/02/21 0534 02/03/21 0452 02/04/21 0630  WBC 13.8* 9.5 7.4 7.2  HGB 8.2* 8.1* 8.3* 9.3*  HCT 27.1* 27.1* 27.9* 31.2*  MCV 76.1* 75.3* 75.4* 76.1*  PLT 399 450* 467* 514*    Basic Metabolic Panel: Recent Labs  Lab 02/01/21 0512 02/02/21 0534 02/03/21 0452 02/04/21 0630  NA 139 141 140 139  K 3.5 3.1* 3.7 4.6  CL 102 105 108 105  CO2 25 26 26 26   GLUCOSE 111* 94 86 141*  BUN 20 24* 21 16  CREATININE 0.83 0.96 0.75 0.71  CALCIUM 8.9 9.0 8.7* 9.2    GFR: Estimated Creatinine Clearance: 95.7 mL/min (by C-G formula based on SCr of 0.71 mg/dL). Liver Function Tests: Recent Labs  Lab 02/01/21 0512  AST 20  ALT 21  ALKPHOS 83  BILITOT 0.4  PROT 6.2*  ALBUMIN 2.3*    No results for input(s): LIPASE, AMYLASE in the last  168 hours. No results for input(s): AMMONIA in the last 168 hours. Coagulation Profile: No results for input(s): INR, PROTIME in the last 168 hours.  Cardiac Enzymes: No results for input(s): CKTOTAL, CKMB, CKMBINDEX, TROPONINI in the last 168 hours. BNP (last 3 results) No results for input(s): PROBNP in the last 8760 hours. HbA1C: No results for input(s): HGBA1C in the last 72 hours. CBG: Recent Labs  Lab 01/31/21 1532  GLUCAP 128*    Lipid Profile: No results for input(s): CHOL, HDL, LDLCALC, TRIG, CHOLHDL, LDLDIRECT in the last 72 hours. Thyroid Function Tests: No results for input(s): TSH, T4TOTAL, FREET4, T3FREE, THYROIDAB in the last 72 hours. Anemia Panel: No results for input(s): VITAMINB12, FOLATE, FERRITIN, TIBC, IRON, RETICCTPCT in the last 72 hours. Sepsis Labs: No results for input(s): PROCALCITON, LATICACIDVEN in the last 168 hours.   Recent Results (from the past 240 hour(s))  Resp Panel by RT-PCR (Flu A&B, Covid) Nasopharyngeal Swab     Status: None   Collection Time: 01/30/21  4:19 PM   Specimen: Nasopharyngeal Swab; Nasopharyngeal(NP) swabs in vial transport medium  Result Value Ref Range Status   SARS Coronavirus 2 by RT PCR NEGATIVE NEGATIVE Final    Comment: (NOTE) SARS-CoV-2 target nucleic acids are NOT DETECTED.  The SARS-CoV-2 RNA is generally detectable in upper respiratory specimens during the acute phase of infection. The lowest concentration of SARS-CoV-2 viral copies this assay can detect is 138 copies/mL. A negative result does not preclude SARS-Cov-2 infection and should not be used as the sole basis for treatment or other patient management decisions. A negative result may occur with  improper specimen collection/handling, submission of specimen other than nasopharyngeal swab, presence of viral mutation(s) within  the areas targeted by this assay, and inadequate number of viral copies(<138 copies/mL). A negative result must be combined  with clinical observations, patient history, and epidemiological information. The expected result is Negative.  Fact Sheet for Patients:  EntrepreneurPulse.com.au  Fact Sheet for Healthcare Providers:  IncredibleEmployment.be  This test is no t yet approved or cleared by the [place] FDA and  has been authorized for detection and/or diagnosis of SARS-CoV-2 by FDA under an Emergency Use Authorization (EUA). This EUA will remain  in effect (meaning this test can be used) for the duration of the COVID-19 declaration under Section 564(b)(1) of the Act, 21 U.S.C.section 360bbb-3(b)(1), unless the authorization is terminated  or revoked sooner.       Influenza A by PCR NEGATIVE NEGATIVE Final   Influenza B by PCR NEGATIVE NEGATIVE Final    Comment: (NOTE) The Xpert Xpress SARS-CoV-2/FLU/RSV plus assay is intended as an aid in the diagnosis of influenza from Nasopharyngeal swab specimens and should not be used as a sole basis for treatment. Nasal washings and aspirates are unacceptable for Xpert Xpress SARS-CoV-2/FLU/RSV testing.  Fact Sheet for Patients: EntrepreneurPulse.com.au  Fact Sheet for Healthcare Providers: IncredibleEmployment.be  This test is not yet approved or cleared by the [place] FDA and has been authorized for detection and/or diagnosis of SARS-CoV-2 by FDA under an Emergency Use Authorization (EUA). This EUA will remain in effect (meaning this test can be used) for the duration of the COVID-19 declaration under Section 564(b)(1) of the Act, 21 U.S.C. section 360bbb-3(b)(1), unless the authorization is terminated or revoked.  Performed at [facility], [place], [place] [address]   Respiratory (~20 pathogens) panel by PCR     Status: None   Collection Time: 01/30/21  4:19 PM   Specimen: Nasopharyngeal Swab; Respiratory  Result Value Ref Range Status    Adenovirus NOT DETECTED NOT DETECTED Final   Coronavirus 229E NOT DETECTED NOT DETECTED Final    Comment: (NOTE) The Coronavirus on the Respiratory Panel, DOES NOT test for the novel  Coronavirus (2019 nCoV)    Coronavirus HKU1 NOT DETECTED NOT DETECTED Final   Coronavirus NL63 NOT DETECTED NOT DETECTED Final   Coronavirus OC43 NOT DETECTED NOT DETECTED Final   Metapneumovirus NOT DETECTED NOT DETECTED Final   Rhinovirus / Enterovirus NOT DETECTED NOT DETECTED Final   Influenza A NOT DETECTED NOT DETECTED Final   Influenza B NOT DETECTED NOT DETECTED Final   Parainfluenza Virus 1 NOT DETECTED NOT DETECTED Final   Parainfluenza Virus 2 NOT DETECTED NOT DETECTED Final   Parainfluenza Virus 3 NOT DETECTED NOT DETECTED Final   Parainfluenza Virus 4 NOT DETECTED NOT DETECTED Final   Respiratory Syncytial Virus NOT DETECTED NOT DETECTED Final   Bordetella pertussis NOT DETECTED NOT DETECTED Final   Bordetella Parapertussis NOT DETECTED NOT DETECTED Final   Chlamydophila pneumoniae NOT DETECTED NOT DETECTED Final   Mycoplasma pneumoniae NOT DETECTED NOT DETECTED Final    Comment: Performed at [facility], [place]. [address], [place], [place] [address]  Blood Culture (routine x 2)     Status: None   Collection Time: 01/30/21  6:14 PM   Specimen: BLOOD  Result Value Ref Range Status   Specimen Description BLOOD BLOOD LEFT ARM  Final   Special Requests   Final    BOTTLES DRAWN AEROBIC AND ANAEROBIC Blood Culture adequate volume   Culture   Final    NO GROWTH 5 DAYS Performed at [facility],  [place], [place] [address]    Report Status 02/04/2021 FINAL  Final  Blood Culture (routine x 2)     Status: None   Collection Time: 01/30/21  6:14 PM   Specimen: BLOOD  Result Value Ref Range Status   Specimen Description BLOOD RIGHT ANTECUBITAL  Final   Special Requests   Final    BOTTLES DRAWN AEROBIC AND ANAEROBIC Blood Culture adequate volume   Culture   Final     NO GROWTH 5 DAYS Performed at [facility], [address], [place], [place] [address]    Report Status 02/04/2021 FINAL  Final  Urine Culture     Status: None   Collection Time: 01/30/21  6:31 PM   Specimen: In/Out Cath Urine  Result Value Ref Range Status   Specimen Description   Final    IN/OUT CATH URINE Performed at [facility], [address] [place], [place] [address]    Special Requests   Final    NONE Performed at [facility], [address] [place], [place] [address]    Culture   Final    NO GROWTH Performed at [facility], [place] [address], [place], [place] [address]    Report Status 02/01/2021 FINAL  Final  MRSA Next Gen by PCR, Nasal     Status: None   Collection Time: 02/01/21  3:20 AM   Specimen: Nasal Mucosa; Nasal Swab  Result Value Ref Range Status   MRSA by PCR Next Gen NOT DETECTED NOT DETECTED Final    Comment: (NOTE) The GeneXpert MRSA Assay (FDA approved for NASAL specimens only), is one component of a comprehensive MRSA colonization surveillance program. It is not intended to diagnose MRSA infection nor to guide or monitor treatment for MRSA infections. Test performance is not FDA approved in patients less than 31 years old. Performed at [facility], [address] [place], [place] [address]      RN Pressure Injury Documentation:     Estimated body mass index is 46.94 kg/m as calculated from the following:   Height as of this encounter: 5\' 7"  (1.702 m).   Weight as of this encounter: 135.9 kg.  Malnutrition Type:   Malnutrition Characteristics:   Nutrition Interventions:   Radiology Studies: No results found.  Scheduled Meds:  apixaban  5 mg Oral BID   furosemide  20 mg Intravenous Daily   gabapentin  200 mg Oral TID   predniSONE  20 mg Oral Q breakfast   Continuous Infusions:   LOS: 8 days   Kerney Elbe, DO [facility] PAGER is on AMION  If 7PM-7AM, please  contact night-coverage [email or web address]

## 2021-02-07 NOTE — TOC Progression Note (Signed)
Transition of Care [facility]) - Progression Note    Patient Details  Name: Wendy Sanchez MRN: [id number] Date of Birth: [birth year]  Transition [facility] CM/SW [place], RN Phone Number: 02/07/2021, 3:32 PM  Clinical Narrative:   Continuing to await appeal results.      Expected Discharge Plan:  (TBD) Barriers to Discharge: Continued Medical Work up  Expected Discharge Plan and Services Expected Discharge Plan:  (TBD)       Living arrangements for the past 2 months: [place] Expected Discharge Date: 02/05/21                                     Social Determinants of Health (SDOH) Interventions    Readmission Risk Interventions Readmission Risk Prevention Plan 02/03/2021  Transportation Screening Complete  Medication Review Press photographer) Complete  PCP or Specialist appointment within 3-5 days of discharge Complete  HRI or Home Care Consult Complete  Palliative Care Screening Not [place] Complete  Some recent data might be hidden

## 2021-02-07 NOTE — Progress Notes (Signed)
Physical Therapy Treatment Patient Details Name: Wendy Sanchez MRN: [id number] DOB: Jun 28, 1950 Today's Date: 02/07/2021   History of Present Illness Pt is a 71 y/o F admitted on 01/30/21 with severe sepsis 2/2 acute diverticulitis. On 01/31/21 pt briefly developed hypotension concerning for developing septic shock & transferred to stepdown; ulimately did not require pressors. 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 PMH: COPD, CHF, HTN, PE on eliquis, giant cell arteritis on prednisone    PT Comments    Patient received in recliner. She is agreeable to PT session. Patient is mod independent for sit to stand from various seat surfaces ( chair, recliner, bed). Ambulated 87 feet with RW and supervision. Required one brief standing rest break due to fatigue. Assisted patient in emptying colostomy and returning to supine in bed. Patient will continue to benefit from skilled PT while here to improve strength and endurance for safe return home.      Recommendations for follow up therapy are one component of a multi-disciplinary discharge planning process, led by the attending physician.  Recommendations may be updated based on patient status, additional functional criteria and insurance authorization.  Follow Up Recommendations  Home health PT     Assistance Recommended at Discharge Intermittent Supervision/Assistance  Patient can return home with the following A little help with bathing/dressing/bathroom;A little help with walking and/or transfers;Assistance with cooking/housework;Assist for transportation;Help with stairs or ramp for entrance   Equipment Recommendations  None recommended by PT    Recommendations for Other Services       Precautions / Restrictions Precautions Precautions: Fall Precaution Comments: mod  fall Restrictions Weight Bearing Restrictions: No     Mobility  Bed Mobility Overal bed mobility: Needs Assistance Bed Mobility: Sit to Supine       Sit to supine: Min assist   General bed mobility comments: Min assist to bring legs up onto bed    Transfers Overall transfer level: Modified independent Equipment used: Rolling walker (2 wheels) Transfers: Sit to/from Stand Sit to Stand: Modified independent (Device/Increase time)                Ambulation/Gait Ambulation/Gait assistance: Supervision Gait Distance (Feet): 80 Feet Assistive device: Rolling walker (2 wheels) Gait Pattern/deviations: Step-through pattern, Decreased step length - right, Decreased step length - left, Shuffle Gait velocity: decreased     General Gait Details: Required one brief standing rest break after about 40 feet. Patient sob with this activity.   Stairs             Wheelchair Mobility    Modified Rankin (Stroke Patients Only)       Balance Overall balance assessment: Modified Independent Sitting-balance support: Feet supported Sitting balance-Leahy Scale: Good Sitting balance - Comments: supervision   Standing balance support: Bilateral upper extremity supported, During functional activity, Reliant on assistive device for balance Standing balance-Leahy Scale: Good Standing balance comment: no external support needed with static standing.                            Cognition Arousal/Alertness: Awake/alert Behavior During Therapy: WFL for tasks assessed/performed Overall Cognitive Status: Within Functional Limits for tasks assessed  General Comments: Pleasant lady, motivated to participate & get better.        Exercises      General Comments        Pertinent Vitals/Pain Pain Assessment Pain Assessment: Faces Faces Pain Scale: Hurts a little bit Pain Location: back Pain Descriptors / Indicators:  Discomfort, Sore Pain Intervention(s): Monitored during session    Home Living                          Prior Function            PT Goals (current goals can now be found in the care plan section) Acute Rehab PT Goals Patient Stated Goal: get better PT Goal Formulation: With patient Time For Goal Achievement: 02/15/21 Potential to Achieve Goals: Good Progress towards PT goals: Progressing toward goals    Frequency    Min 2X/week      PT Plan Current plan remains appropriate    Co-evaluation              AM-PAC PT "6 Clicks" Mobility   Outcome Measure  Help needed turning from your back to your side while in a flat bed without using bedrails?: None Help needed moving from lying on your back to sitting on the side of a flat bed without using bedrails?: None Help needed moving to and from a bed to a chair (including a wheelchair)?: None Help needed standing up from a chair using your arms (e.g., wheelchair or bedside chair)?: None Help needed to walk in hospital room?: A Little Help needed climbing 3-5 steps with a railing? : A Lot 6 Click Score: 21    End of Session Equipment Utilized During Treatment: Gait belt Activity Tolerance: Patient tolerated treatment well;Patient limited by fatigue Patient left: with call bell/phone within reach;in bed;with bed alarm set Nurse Communication: Mobility status PT Visit Diagnosis: Muscle weakness (generalized) (M62.81);Difficulty in walking, not elsewhere classified (R26.2)     Time: 3007-6226 PT Time Calculation (min) (ACUTE ONLY): 32 min  Charges:  $Gait Training: 8-22 mins $Therapeutic Activity: 8-22 mins                     Meghin Thivierge, PT, GCS 02/07/21,11:52 AM

## 2021-02-08 NOTE — Progress Notes (Signed)
PROGRESS NOTE    Wendy Sanchez  XHB:[id number] DOB: Apr 22, 1950 DOA: 01/30/2021 PCP: Crissie Figures, PA-C   Brief Narrative:  Patient is a 71 year old African-American female with past medical history significant for [place] to COPD, CHF, hypertension, history of PE on anticoagulation with Eliquis, history of giant cell arteritis on prednisone, history of colostomy in 12/03/2020 status post perforated sigmoid diverticulitis who was hospitalized at [facility] from 12/24/2020 until 01/15/2021 for septic shock secondary to intra-abdominal abscesses and not drainable by IR and discharged to SNF on IV Zosyn via PICC line and was supposed to be 1 from 12/25 - 12/24/2018 today with plans for outpatient ID follow-up and repeat CAT scan 4 weeks from discharge.  She presented to the ED from her SNF after developing shaking, chills and a fever of 101.5.  Given her fever on 02/01/2020 her PICC line was removed as a potential source of infection and she is started on meropenem and vancomycin per ID.  She is given stress dose steroids per the ICU team and neurosurgery was consulted.  She was then transferred to the floor on 02/02/2020 and tolerated her diet and blood pressure stable.  On 02/02/2021 her home dose of prednisone was increased to 20 mg and she was also restarted on Percocet and gabapentin.  02/04/2020 she remained afebrile and her blood pressure is well controlled.  CT of the abdomen pelvis was done and showed no acute pathology on 02/04/2021 and ID recommended 1 more day of IV Abx she was changed to 1 more day of ertapenem and completed 7 days of antibiotics.  Her leukocytosis resolved and she has been afebrile since 02/01/2021.  ID felt that there is no need to continue antibiotics through 02/23/2021 after further discussion with the ID physician at [facility] Dr. Beather Arbour.  Unfortunately insurance did not approve her for SNF or CIR so she will go home with home health at this time if she is medically stable.  **Please See  Discharge Summary Dated 02/05/21 for Further Details.   **The patient was deemed medically stable to be discharged home with home health services 02/05/21  but she appealed the discharge and appeal information returned and [facility] agrees with the D/C.  She still remains medically stable to be discharged home with Home Services.  TOC involved and providing and setting up home health services and wound nurse was consulted for ostomy teaching and this has been done. She is working well with Therapy and feels good today but a little weak. She has a follow up with [facility] ID on February 6th 2020.   Assessment & Plan:   Principal Problem:   Severe sepsis ([place]) Active Problems:   Morbid obesity with BMI of 45.0-49.9, adult (HCC)   Chronic, continuous use of opioids   Giant cell arteritis ([place])   Colostomy in place [facility])   AKI (acute kidney injury) ([place])   Acute diverticulitis   Current chronic use of systemic steroids   Chronic diastolic CHF (congestive heart failure) (HCC)   Compression fracture of T2 vertebra (HCC)   S/P PICC central line placement  Sepsis secondary to likely intra-abdominal source -Her PICC line was removed as a potential source -Initial CT of the abdomen pelvis was done and showed a mild diverticulitis -WBC improved went from 15.9 is now resolved to 7.2 on last check -She has been seen by general surgery as well as infectious diseases -Infectious diseases recommended 1 more dose of ertapenem up which will be given this a.m. and recommended no  further antibiotics -Repeat CT of the abdomen pelvis without contrast showed "Resolution of previously noted descending colon diverticulitis. No new acute findings are noted on today's examination. Aortic atherosclerosis. Additional postoperative changes and incidental findings, as above." -Sepsis physiology has resolved -PT OT recommending home health she is stable for discharge at this time but patient has appealed her discharge.;  PT OT  recommended continued intermittent supervision and assistance and patient's sister can check in on her every so often.  We will send her home with home health services and max her out with PT, OT, RN, aide, social work   Chronic diastolic CHF, present on admission -Compensated at this time and currently not in exacerbation -Continue strict I's and O's and daily weights -Continue with IV furosemide 20 mg p.o. daily and changed back to p.o. home dose of Torsemide at D/C -She is -1.447 mL since admission but this is likely accurate given that she is urinating quite frequently -No longer taking Metoprolol and Entresto  -Continue to monitor for signs and symptoms of volume overload and follow-up with cardiology in outpatient setting within 1 week   Acute Diverticulitis -Antibiotic regimen per ID recommendations and she will recieve a dose of ertapenem this AM with no further antibiotics needed -No surgical indication at this time and Surgery has signed off the case -Repeat CT of the abdomen pelvis showed resolution -Per ID no further Abx needed  -Follow-up with general surgeon outpatient setting at [facility]   AKI -Resolved. BUN/Cr was 16/0.71 on the last check; BUN/Cr was as high as 26/1.47 -Avoid further nephrotoxic medications, contrast dyes, hypotension and dehydration and renally dose medications -Resume home Diuretics with Spironolactone and Torsemide  -Continue to monitor and trend and repeat CMP in the outpatient setting within 1 week   Giant Cell Arteritis -C/w Prednisone 20 mg po Daily    History of PE on anticoagulation -Continue with home Apixaban 5 mg p.o. twice daily   Microcytic Anemia -Patient's hemoglobin/hematocrit is now stable at 9.3/31.2 on last check with an MCV of 76.1 -Check anemia panel in outpatient setting -Continue to monitor for signs and symptoms of bleeding; currently no overt bleeding noted -Repeat CBC within 1 week   Thrombocytosis -Likely reactive in the  setting of above -Patient's Platelet Count went from 399 -> 450 -> 467 -> 514 on last check  -Continue to Monitor and Trend in the outpatient setting -Repeat CBC within 1 week   COPD -Currently not in exacerbation -Denies any shortness of breath, cough, sputum production -Continue with Duoneb 3 mL q6hprn SOB -No longer taking Anoro Ellipta, Montelukast 10 mg po qHS, and Albuterol IH 2 piff   Chronic Pain Syndrome/Chronic Opioid Use -C/w oxycodone-acetaminophen 5-325 mg/tab 2 tabs p.o. every 4 as needed severe pain and moderate pain -Continue with Gabapentin 200 mg p.o. 3 times daily with   Morbid Obesity -Complicates overall prognosis and care -Estimated body mass index is 46.94 kg/m as calculated from the following:   Height as of this encounter: 5\' 7"  (1.702 m).   Weight as of this encounter: 135.9 kg.  -Weight Loss and Dietary Counseling given  DVT prophylaxis: Anticoagulated with apixaban 5 mg p.o. twice daily Code Status: FULL CODE Family Communication: No family present at bedside  Disposition Plan: She has been deemed medically stable to be discharged home with home health services and currently awaiting the decision for her appeal  Status is: Inpatient  Remains inpatient appropriate because: Currently awaiting medical decision appeal for her discharge  Consultants:  PCCM Transfer General Surgery  ID   Procedures: CT Scan  Antimicrobials:  Anti-infectives (From admission, onward)    Start     Dose/Rate Route Frequency Ordered Stop   02/05/21 0800  ertapenem (INVANZ) 1,000 mg in sodium chloride 0.9 % 100 mL IVPB        1 g 200 mL/hr over 30 Minutes Intravenous  Once 02/04/21 1547 02/05/21 0837   02/04/21 2000  fluconazole (DIFLUCAN) tablet 150 mg        150 mg Oral  Once 02/04/21 1825 02/04/21 2057   02/01/21 0300  meropenem (MERREM) 1 g in sodium chloride 0.9 % 100 mL IVPB        1 g 200 mL/hr over 30 Minutes Intravenous Every 8 hours 01/31/21 2018 02/04/21  2200   01/31/21 2100  vancomycin (VANCOREADY) IVPB 2000 mg/400 mL  Status:  Discontinued        2,000 mg 200 mL/hr over 120 Minutes Intravenous Every 24 hours 01/31/21 2003 02/03/21 1311   01/31/21 0230  ceFEPIme (MAXIPIME) 2 g in sodium chloride 0.9 % 100 mL IVPB        2 g 200 mL/hr over 30 Minutes Intravenous Every 8 hours 01/31/21 0012 01/31/21 1828   01/30/21 2015  vancomycin (VANCOCIN) IVPB 1000 mg/200 mL premix  Status:  Discontinued       See Hyperspace for full Linked Orders Report.   1,000 mg 200 mL/hr over 60 Minutes Intravenous  Once 01/30/21 1812 01/30/21 1813   01/30/21 1815  vancomycin (VANCOREADY) IVPB 2000 mg/400 mL       See Hyperspace for full Linked Orders Report.   2,000 mg 200 mL/hr over 120 Minutes Intravenous  Once 01/30/21 1812 01/31/21 0404   01/30/21 1800  ceFEPIme (MAXIPIME) 2 g in sodium chloride 0.9 % 100 mL IVPB        2 g 200 mL/hr over 30 Minutes Intravenous  Once 01/30/21 1752 01/30/21 1851   01/30/21 1800  metroNIDAZOLE (FLAGYL) IVPB 500 mg        500 mg 100 mL/hr over 60 Minutes Intravenous  Once 01/30/21 1752 01/30/21 2111   01/30/21 1800  vancomycin (VANCOCIN) IVPB 1000 mg/200 mL premix  Status:  Discontinued        1,000 mg 200 mL/hr over 60 Minutes Intravenous  Once 01/30/21 1752 01/30/21 1810   01/30/21 0700  metroNIDAZOLE (FLAGYL) IVPB 500 mg  Status:  Discontinued        500 mg 100 mL/hr over 60 Minutes Intravenous Every 12 hours 01/30/21 2329 01/31/21 2017        Subjective: Seen and examined at bedside and doing fairly well. Felt good today. No Nausea or vomiting. Still a little weak. No lightheadedness or dizziness. She is dressed and ready to go and remains medically stable to D/C Home with [place].  Objective: Vitals:   02/06/21 2035 02/07/21 0550 02/07/21 1553 02/07/21 2127  BP: 139/75 (!) 141/79 113/74 128/74  Pulse: 84 100 94 81  Resp: 15 18 18 16   Temp: 98 F (36.7 C) 98.2 F (36.8 C) 98.4 F (36.9 C) 98.4 F  (36.9 C)  TempSrc: Oral  Oral   SpO2: 99% 100% 99% 98%  Weight:      Height:        Intake/Output Summary (Last 24 hours) at 02/08/2021 0828 Last data filed at 02/08/2021 0650 Gross per 24 hour  Intake 480 ml  Output 1700 ml  Net -1220 ml  Filed Weights   01/30/21 1615  Weight: 135.9 kg   Examination: Physical Exam:  Constitutional: WN/WD morbidly obese AAF in NAD appears calm and comfortable Respiratory: Mildly diminished to auscultation with coarse breath sounds. Unlabored breathing  Cardiovascular: RRR; Has Mild 1+ LE Edema  Abdomen: Soft, mildly tender. Distended 2/2 body habitus. Ostomy in place. Neurologic: CN 2-12 Grossly intact. No appreciable focal deficits Psychiatric: Normal judgement and insight. Awake and Alert  Data Reviewed: I have personally reviewed following labs and imaging studies  CBC: Recent Labs  Lab 02/02/21 0534 02/03/21 0452 02/04/21 0630  WBC 9.5 7.4 7.2  HGB 8.1* 8.3* 9.3*  HCT 27.1* 27.9* 31.2*  MCV 75.3* 75.4* 76.1*  PLT 450* 467* 514*    Basic Metabolic Panel: Recent Labs  Lab 02/02/21 0534 02/03/21 0452 02/04/21 0630  NA 141 140 139  K 3.1* 3.7 4.6  CL 105 108 105  CO2 26 26 26   GLUCOSE 94 86 141*  BUN 24* 21 16  CREATININE 0.96 0.75 0.71  CALCIUM 9.0 8.7* 9.2    GFR: Estimated Creatinine Clearance: 95.7 mL/min (by C-G formula based on SCr of 0.71 mg/dL). Liver Function Tests: No results for input(s): AST, ALT, ALKPHOS, BILITOT, PROT, ALBUMIN in the last 168 hours.  No results for input(s): LIPASE, AMYLASE in the last 168 hours. No results for input(s): AMMONIA in the last 168 hours. Coagulation Profile: No results for input(s): INR, PROTIME in the last 168 hours.  Cardiac Enzymes: No results for input(s): CKTOTAL, CKMB, CKMBINDEX, TROPONINI in the last 168 hours. BNP (last 3 results) No results for input(s): PROBNP in the last 8760 hours. HbA1C: No results for input(s): HGBA1C in the last 72 hours. CBG: No  results for input(s): GLUCAP in the last 168 hours.  Lipid Profile: No results for input(s): CHOL, HDL, LDLCALC, TRIG, CHOLHDL, LDLDIRECT in the last 72 hours. Thyroid Function Tests: No results for input(s): TSH, T4TOTAL, FREET4, T3FREE, THYROIDAB in the last 72 hours. Anemia Panel: No results for input(s): VITAMINB12, FOLATE, FERRITIN, TIBC, IRON, RETICCTPCT in the last 72 hours. Sepsis Labs: No results for input(s): PROCALCITON, LATICACIDVEN in the last 168 hours.   Recent Results (from the past 240 hour(s))  Resp Panel by RT-PCR (Flu A&B, Covid) Nasopharyngeal Swab     Status: None   Collection Time: 01/30/21  4:19 PM   Specimen: Nasopharyngeal Swab; Nasopharyngeal(NP) swabs in vial transport medium  Result Value Ref Range Status   SARS Coronavirus 2 by RT PCR NEGATIVE NEGATIVE Final    Comment: (NOTE) SARS-CoV-2 target nucleic acids are NOT DETECTED.  The SARS-CoV-2 RNA is generally detectable in upper respiratory specimens during the acute phase of infection. The lowest concentration of SARS-CoV-2 viral copies this assay can detect is 138 copies/mL. A negative result does not preclude SARS-Cov-2 infection and should not be used as the sole basis for treatment or other patient management decisions. A negative result may occur with  improper specimen collection/handling, submission of specimen other than nasopharyngeal swab, presence of viral mutation(s) within the areas targeted by this assay, and inadequate number of viral copies(<138 copies/mL). A negative result must be combined with clinical observations, patient history, and epidemiological information. The expected result is Negative.  Fact Sheet for Patients:  EntrepreneurPulse.com.au  Fact Sheet for Healthcare Providers:  IncredibleEmployment.be  This test is no t yet approved or cleared by the [place] FDA and  has been authorized for detection and/or diagnosis of  SARS-CoV-2 by FDA under an Emergency Use Authorization (  EUA). This EUA will remain  in effect (meaning this test can be used) for the duration of the COVID-19 declaration under Section 564(b)(1) of the Act, 21 U.S.C.section 360bbb-3(b)(1), unless the authorization is terminated  or revoked sooner.       Influenza A by PCR NEGATIVE NEGATIVE Final   Influenza B by PCR NEGATIVE NEGATIVE Final    Comment: (NOTE) The Xpert Xpress SARS-CoV-2/FLU/RSV plus assay is intended as an aid in the diagnosis of influenza from Nasopharyngeal swab specimens and should not be used as a sole basis for treatment. Nasal washings and aspirates are unacceptable for Xpert Xpress SARS-CoV-2/FLU/RSV testing.  Fact Sheet for Patients: EntrepreneurPulse.com.au  Fact Sheet for Healthcare Providers: IncredibleEmployment.be  This test is not yet approved or cleared by the [place] FDA and has been authorized for detection and/or diagnosis of SARS-CoV-2 by FDA under an Emergency Use Authorization (EUA). This EUA will remain in effect (meaning this test can be used) for the duration of the COVID-19 declaration under Section 564(b)(1) of the Act, 21 U.S.C. section 360bbb-3(b)(1), unless the authorization is terminated or revoked.  Performed at [facility], [place], [place] [address]   Respiratory (~20 pathogens) panel by PCR     Status: None   Collection Time: 01/30/21  4:19 PM   Specimen: Nasopharyngeal Swab; Respiratory  Result Value Ref Range Status   Adenovirus NOT DETECTED NOT DETECTED Final   Coronavirus 229E NOT DETECTED NOT DETECTED Final    Comment: (NOTE) The Coronavirus on the Respiratory Panel, DOES NOT test for the novel  Coronavirus (2019 nCoV)    Coronavirus HKU1 NOT DETECTED NOT DETECTED Final   Coronavirus NL63 NOT DETECTED NOT DETECTED Final   Coronavirus OC43 NOT DETECTED NOT DETECTED Final   Metapneumovirus NOT  DETECTED NOT DETECTED Final   Rhinovirus / Enterovirus NOT DETECTED NOT DETECTED Final   Influenza A NOT DETECTED NOT DETECTED Final   Influenza B NOT DETECTED NOT DETECTED Final   Parainfluenza Virus 1 NOT DETECTED NOT DETECTED Final   Parainfluenza Virus 2 NOT DETECTED NOT DETECTED Final   Parainfluenza Virus 3 NOT DETECTED NOT DETECTED Final   Parainfluenza Virus 4 NOT DETECTED NOT DETECTED Final   Respiratory Syncytial Virus NOT DETECTED NOT DETECTED Final   Bordetella pertussis NOT DETECTED NOT DETECTED Final   Bordetella Parapertussis NOT DETECTED NOT DETECTED Final   Chlamydophila pneumoniae NOT DETECTED NOT DETECTED Final   Mycoplasma pneumoniae NOT DETECTED NOT DETECTED Final    Comment: Performed at [facility], [place]. [address] [place], [place] [address]  Blood Culture (routine x 2)     Status: None   Collection Time: 01/30/21  6:14 PM   Specimen: BLOOD  Result Value Ref Range Status   Specimen Description BLOOD BLOOD LEFT ARM  Final   Special Requests   Final    BOTTLES DRAWN AEROBIC AND ANAEROBIC Blood Culture adequate volume   Culture   Final    NO GROWTH 5 DAYS Performed at [facility], [address] [place], [place] [address]    Report Status 02/04/2021 FINAL  Final  Blood Culture (routine x 2)     Status: None   Collection Time: 01/30/21  6:14 PM   Specimen: BLOOD  Result Value Ref Range Status   Specimen Description BLOOD RIGHT ANTECUBITAL  Final   Special Requests   Final    BOTTLES DRAWN AEROBIC AND ANAEROBIC Blood Culture adequate volume   Culture   Final  NO GROWTH 5 DAYS Performed at [facility], [address] [place], [place] [address]    Report Status 02/04/2021 FINAL  Final  Urine Culture     Status: None   Collection Time: 01/30/21  6:31 PM   Specimen: In/Out Cath Urine  Result Value Ref Range Status   Specimen Description   Final    IN/OUT CATH URINE Performed at [facility], [address],  [place], [place] [address]    Special Requests   Final    NONE Performed at [facility], [address], [place], [place] [address]    Culture   Final    NO GROWTH Performed at [facility], [place] [address], [place], [place] [address]    Report Status 02/01/2021 FINAL  Final  MRSA Next Gen by PCR, Nasal     Status: None   Collection Time: 02/01/21  3:20 AM   Specimen: Nasal Mucosa; Nasal Swab  Result Value Ref Range Status   MRSA by PCR Next Gen NOT DETECTED NOT DETECTED Final    Comment: (NOTE) The GeneXpert MRSA Assay (FDA approved for NASAL specimens only), is one component of a comprehensive MRSA colonization surveillance program. It is not intended to diagnose MRSA infection nor to guide or monitor treatment for MRSA infections. Test performance is not FDA approved in patients less than 36 years old. Performed at [facility], [address] [place], [place] [address]      RN Pressure Injury Documentation:     Estimated body mass index is 46.94 kg/m as calculated from the following:   Height as of this encounter: 5\' 7"  (1.702 m).   Weight as of this encounter: 135.9 kg.  Malnutrition Type:   Malnutrition Characteristics:   Nutrition Interventions:   Radiology Studies: No results found.  Scheduled Meds:  apixaban  5 mg Oral BID   furosemide  20 mg Intravenous Daily   gabapentin  200 mg Oral TID   predniSONE  20 mg Oral Q breakfast   Continuous Infusions:   LOS: 9 days   Kerney Elbe, DO [facility] PAGER is on AMION  If 7PM-7AM, please contact night-coverage [email or web address]

## 2021-03-10 ENCOUNTER — Ambulatory Visit: Payer: Medicare Other | Attending: Anesthesiology | Admitting: Anesthesiology

## 2021-03-10 ENCOUNTER — Encounter: Payer: Self-pay | Admitting: Anesthesiology

## 2021-03-10 ENCOUNTER — Other Ambulatory Visit: Payer: Self-pay

## 2021-03-10 DIAGNOSIS — M5136 Other intervertebral disc degeneration, lumbar region: Secondary | ICD-10-CM | POA: Diagnosis not present

## 2021-03-10 DIAGNOSIS — M5442 Lumbago with sciatica, left side: Secondary | ICD-10-CM | POA: Diagnosis not present

## 2021-03-10 DIAGNOSIS — F1190 Opioid use, unspecified, uncomplicated: Secondary | ICD-10-CM | POA: Diagnosis not present

## 2021-03-10 DIAGNOSIS — M48062 Spinal stenosis, lumbar region with neurogenic claudication: Secondary | ICD-10-CM

## 2021-03-10 DIAGNOSIS — G894 Chronic pain syndrome: Secondary | ICD-10-CM | POA: Diagnosis not present

## 2021-03-10 DIAGNOSIS — M25511 Pain in right shoulder: Secondary | ICD-10-CM

## 2021-03-10 DIAGNOSIS — M5441 Lumbago with sciatica, right side: Secondary | ICD-10-CM

## 2021-03-10 DIAGNOSIS — M51369 Other intervertebral disc degeneration, lumbar region without mention of lumbar back pain or lower extremity pain: Secondary | ICD-10-CM

## 2021-03-10 DIAGNOSIS — M316 Other giant cell arteritis: Secondary | ICD-10-CM

## 2021-03-10 DIAGNOSIS — G8929 Other chronic pain: Secondary | ICD-10-CM

## 2021-03-10 DIAGNOSIS — M25551 Pain in right hip: Secondary | ICD-10-CM

## 2021-03-10 DIAGNOSIS — M19011 Primary osteoarthritis, right shoulder: Secondary | ICD-10-CM

## 2021-03-10 DIAGNOSIS — M25552 Pain in left hip: Secondary | ICD-10-CM

## 2021-03-10 DIAGNOSIS — I5032 Chronic diastolic (congestive) heart failure: Secondary | ICD-10-CM

## 2021-03-10 MED ORDER — OXYCODONE-ACETAMINOPHEN 10-325 MG PO TABS: 1.0000 | ORAL_TABLET | ORAL | 0 refills | Status: AC | PRN

## 2021-03-10 MED ORDER — OXYCODONE-ACETAMINOPHEN 10-325 MG PO TABS: 1.0000 | ORAL_TABLET | ORAL | 0 refills | Status: DC | PRN

## 2021-03-10 MED ORDER — GABAPENTIN 600 MG PO TABS: 600.0000 mg | ORAL_TABLET | Freq: Three times a day (TID) | ORAL | 3 refills | Status: DC

## 2021-03-10 NOTE — Progress Notes (Signed)
Virtual Visit via Telephone Note  I connected with Wendy Sanchez on 03/10/21 at  4:00 PM EST by telephone and verified that I am speaking with the correct person using two identifiers.  Location: Patient: Home Provider: Pain control center   I discussed the limitations, risks, security and privacy concerns of performing an evaluation and management service by telephone and the availability of in person appointments. I also discussed with the patient that there may be a patient responsible charge related to this service. The patient expressed understanding and agreed to proceed.   History of Present Illness: I spoke with Wendy Sanchez today via telephone as she was unable to do the video portion of the conference.  She reports that she is slowly recovering from her recent septic incident with required hospitalization at [facility].  She is still on multiple antibiotics for this and the etiology is uncertain though they think it is coming from her diverticulum.  In regards to her back pain and diffuse body pain it stable with no recent changes in quality characteristic or distribution.  She still taking medications as prescribed and these continue to work well for her.  No side effects with the medicines are noted.  Otherwise she is in her usual state of health with this.  She is trying to stay active and continue efforts at weight loss with limited success especially with this recent setback and her septicemia.  She denies any side effects with the medications and continues to derive good functional benefit from them without side effect reported.  Review of systems: General: No fevers or chills Pulmonary: No shortness of breath or dyspnea Cardiac: No angina or palpitations or lightheadedness GI: No abdominal pain or constipation Psych: No depression    Observations/Objective: Past Medical History:  Diagnosis Date   Allergic rhinitis    Arthritis    Asthma    problems with fumes and aerosols cause  asthma   Chest pain 10/19/2016   CHF (congestive heart failure) ([place])    Complication of anesthesia    awakens during surgery; has occurred with last 3-4 surgeries    COPD (chronic obstructive pulmonary disease) ([place])    DVT (deep venous thrombosis) ([place]) 07/13/2018   Right leg   Environmental allergies    fumes    Fibromyalgia    GERD (gastroesophageal reflux disease)    Headache    Hemorrhoids    History of bronchitis    Hx of total knee arthroplasty 12/13/2015   Hyperlipidemia    Hypertension    Morbid obesity with BMI of 45.0-49.9, adult ([place]) 07/25/2015   NICM (nonischemic cardiomyopathy) ([place])    a. ? PVC mediated;  b. 06/2016 Echo: EF 25-30%, mild LVH, mild LAE, mild AI/MR/TR/PR; c. 08/2016 Cath: nl cors, EF 25%.   Numbness    hands bilat when driving; improves when not preforming task    Osteoarthritis of left knee 08/02/2015   PE (pulmonary thromboembolism) ([place]) 07/13/2018   Pes anserinus bursitis of left knee 05/18/2016   Presence of right artificial knee joint 05/18/2016   PVC (premature ventricular contraction) 06/04/2017   PVC's (premature ventricular contractions)    a. 11/2016 Amio started;  b. 11/29/16 48h Holter: 95621 PVC's (41%); c. 12/2016 24h Holter: 18040 PVC's (15%); c. 02/2017 48h Holter: 37262 PVC's (41%).   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  Temporal arteritis ([place]) 05/01/2020   Tinnitus    comes and goes    Unilateral primary osteoarthritis, right knee 12/13/2015   Vertigo    none for over 2 yrs      Current Outpatient Medications:    gabapentin (NEURONTIN) 600 MG tablet, Take 1 tablet (600 mg total) by mouth 3 (three) times daily., Disp: 90 tablet, Rfl: 3   [START ON 04/25/2021] oxyCODONE-acetaminophen (PERCOCET) 10-325 MG tablet, Take 1 tablet by mouth every 4 (four) hours as needed for pain., Disp: 150 tablet, Rfl: 0    acetaminophen (TYLENOL) 325 MG tablet, Take 2 tablets (650 mg total) by mouth every 6 (six) hours as needed for mild pain or fever., Disp: 60 tablet, Rfl: 0   apixaban (ELIQUIS) 5 MG TABS tablet, Take 1 tablet (5 mg total) by mouth 2 (two) times daily., Disp: 60 tablet, Rfl: 0   calcium carbonate (OS-CAL - DOSED IN MG OF ELEMENTAL CALCIUM) 1250 (500 Ca) MG tablet, Take 2 tablets by mouth in the morning., Disp: , Rfl:    diphenhydrAMINE (BENADRYL) 25 mg capsule, Take 25 mg by mouth every 6 (six) hours as needed for itching or allergies., Disp: , Rfl:    docusate sodium (COLACE) 100 MG capsule, Take 200 mg by mouth at bedtime as needed for mild constipation., Disp: , Rfl:    gabapentin (NEURONTIN) 100 MG capsule, Take 2 capsules (200 mg total) by mouth 3 (three) times daily., Disp: 90 capsule, Rfl: 0   ipratropium-albuterol (DUONEB) 0.5-2.5 (3) MG/3ML SOLN, Take 3 mLs by nebulization every 6 (six) hours as needed (shortness of breath)., Disp: , Rfl:    levocetirizine (XYZAL) 5 MG tablet, Take 5 mg by mouth every evening., Disp: , Rfl:    nortriptyline (PAMELOR) 50 MG capsule, Take 50 mg by mouth at bedtime., Disp: , Rfl:    nystatin (MYCOSTATIN) 100000 UNIT/ML suspension, Take 5 mLs by mouth 4 (four) times daily., Disp: , Rfl:    nystatin cream (MYCOSTATIN), Apply 1 application topically 2 (two) times daily. (Apply under skin folds), Disp: , Rfl:    ondansetron (ZOFRAN) 4 MG tablet, Take 1 tablet (4 mg total) by mouth every 6 (six) hours as needed for nausea., Disp: 20 tablet, Rfl: 0   [START ON 03/26/2021] oxyCODONE-acetaminophen (PERCOCET) 10-325 MG tablet, Take 1 tablet by mouth every 4 (four) hours as needed for pain., Disp: 150 tablet, Rfl: 0   pantoprazole (PROTONIX) 40 MG tablet, Take 40 mg by mouth daily., Disp: , Rfl:    polyethylene glycol (MIRALAX / GLYCOLAX) 17 g packet, Take 17 g by mouth daily as needed for moderate constipation., Disp: 14 each, Rfl: 0   potassium chloride SA (KLOR-CON M)  20 MEQ tablet, Take 20 mEq by mouth 2 (two) times daily., Disp: , Rfl:    predniSONE (DELTASONE) 10 MG tablet, Take 20 mg by mouth in the morning., Disp: , Rfl:    spironolactone (ALDACTONE) 25 MG tablet, Take 25 mg by mouth daily., Disp: , Rfl:    torsemide (DEMADEX) 20 MG tablet, Take 2 tablets (40 mg) by mouth once daily (Patient taking differently: Take 60 mg by mouth every Monday, Wednesday, and Friday. Take 2 tablets (40 mg) by mouth once daily), Disp: 60 tablet, Rfl: 3   Wound Cleansers (VASHE WOUND THERAPY) SOLN, Apply 1 application topically daily. (Irrigate abdominal wound), Disp: , Rfl:    Assessment and Plan: 1. DDD (degenerative disc disease), lumbar   2. Chronic bilateral low back pain with bilateral  sciatica   3. Chronic, continuous use of opioids   4. Chronic pain syndrome   5. Hip pain, bilateral   6. Spinal stenosis of lumbar region with neurogenic claudication   7. Pain in joint of right shoulder   8. Temporal arteritis ([place])   9. Primary osteoarthritis of right shoulder   10. Chronic diastolic CHF (congestive heart failure) (HCC)   Based on our discussion today it appears that she is doing reasonably well with her current pain management and our current pharmacologic regimen.  No changes will be made.  I have reviewed the [facility] practitioner database information and is appropriate for refills today.  These are called in for March 15 and April 14.  I encouraged her to stay active as best she can with limited stretching exercises as tolerated.  No other changes in her pain management protocol will be initiated.  She is instructed to continue follow-up with her primary care physicians from [facility] in regards to her other underlying medical conditions with return to clinic in 2 months.  Follow Up Instructions:    I discussed the assessment and treatment plan with the patient. The patient was provided an opportunity to ask questions and all were answered. The patient agreed  with the plan and demonstrated an understanding of the instructions.   The patient was advised to call back or seek an in-person evaluation if the symptoms worsen or if the condition fails to improve as anticipated.  I provided 30 minutes of non-face-to-face time during this encounter.   Molli Barrows, MD

## 2021-03-12 ENCOUNTER — Other Ambulatory Visit: Payer: Self-pay | Admitting: *Deleted

## 2021-03-12 ENCOUNTER — Other Ambulatory Visit: Payer: Self-pay | Admitting: Anesthesiology

## 2021-03-12 DIAGNOSIS — D649 Anemia, unspecified: Secondary | ICD-10-CM

## 2021-03-14 ENCOUNTER — Telehealth: Payer: Self-pay | Admitting: *Deleted

## 2021-03-14 NOTE — Telephone Encounter (Signed)
Patient called and requested a call back to discuss her apts on Tuesday 3/7 ? ?I returned her phone call. Patient is currently hospitalized at [facility]. Apts cnl per request. She will contact our office back when she gets out of the [place] to r/s her apts. She thanked me for returning her phone call. ?

## 2021-03-18 ENCOUNTER — Ambulatory Visit: Payer: Medicare Other | Admitting: Oncology

## 2021-03-18 ENCOUNTER — Other Ambulatory Visit: Payer: Medicare Other

## 2021-04-07 DIAGNOSIS — Z86711 Personal history of pulmonary embolism: Secondary | ICD-10-CM | POA: Diagnosis present

## 2021-04-28 ENCOUNTER — Ambulatory Visit: Payer: Medicare Other | Attending: Anesthesiology | Admitting: Anesthesiology

## 2021-04-28 ENCOUNTER — Encounter: Payer: Self-pay | Admitting: Anesthesiology

## 2021-04-28 DIAGNOSIS — M25551 Pain in right hip: Secondary | ICD-10-CM

## 2021-04-28 DIAGNOSIS — M5136 Other intervertebral disc degeneration, lumbar region: Secondary | ICD-10-CM

## 2021-04-28 DIAGNOSIS — F1190 Opioid use, unspecified, uncomplicated: Secondary | ICD-10-CM

## 2021-04-28 DIAGNOSIS — M25511 Pain in right shoulder: Secondary | ICD-10-CM

## 2021-04-28 DIAGNOSIS — M5442 Lumbago with sciatica, left side: Secondary | ICD-10-CM | POA: Diagnosis not present

## 2021-04-28 DIAGNOSIS — M5441 Lumbago with sciatica, right side: Secondary | ICD-10-CM

## 2021-04-28 DIAGNOSIS — M51369 Other intervertebral disc degeneration, lumbar region without mention of lumbar back pain or lower extremity pain: Secondary | ICD-10-CM

## 2021-04-28 DIAGNOSIS — M48062 Spinal stenosis, lumbar region with neurogenic claudication: Secondary | ICD-10-CM

## 2021-04-28 DIAGNOSIS — G8929 Other chronic pain: Secondary | ICD-10-CM

## 2021-04-28 DIAGNOSIS — M316 Other giant cell arteritis: Secondary | ICD-10-CM

## 2021-04-28 DIAGNOSIS — G894 Chronic pain syndrome: Secondary | ICD-10-CM

## 2021-04-28 DIAGNOSIS — M25552 Pain in left hip: Secondary | ICD-10-CM

## 2021-04-28 DIAGNOSIS — M19011 Primary osteoarthritis, right shoulder: Secondary | ICD-10-CM

## 2021-04-28 MED ORDER — OXYCODONE-ACETAMINOPHEN 10-325 MG PO TABS: 1.0000 | ORAL_TABLET | ORAL | 0 refills | Status: DC | PRN

## 2021-04-28 MED ORDER — OXYCODONE-ACETAMINOPHEN 10-325 MG PO TABS: 1.0000 | ORAL_TABLET | ORAL | 0 refills | Status: AC | PRN

## 2021-04-28 NOTE — Progress Notes (Signed)
Virtual Visit via Telephone Note ? ?I connected with Wendy Sanchez on 04/28/21 at  1:00 PM EDT by telephone and verified that I am speaking with the correct person using two identifiers. ? ?Location: ?Patient: Home ?Provider: Pain control center ?  ?I discussed the limitations, risks, security and privacy concerns of performing an evaluation and management service by telephone and the availability of in person appointments. I also discussed with the patient that there may be a patient responsible charge related to this service. The patient expressed understanding and agreed to proceed. ? ? ?History of Present Illness: ?I spoke with Wendy Sanchez via telephone as she was unable link for the video portion of the conference.  She states that she is slowly recovering from her recent hospitalizations.  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  This has been difficult to treat and has required several repeat hospitalizations but slowly she feels she is on the mend.  She still taking her oxycodone prescribed by [place] for chronic pain affecting the low back hips knees and right shoulder.  The quality characteristic and distribution of this pain is stable without recent change.  She still continues to get good relief with the oxycodone rated at about 60 to 70% lasting about 4 hours before she has recurrence of the same quality pain.  The pain is disabling distressing and keeps her from functioning or sleeping if she is not taking her chronic opioid therapy.  She has failed more conservative therapy and unfortunately has required chronic opioid management for pain control.  Otherwise she is in her usual state of health at this time. ? ?Review of systems: ?General: No fevers or chills ?Pulmonary: No shortness of breath or dyspnea ?Cardiac: No angina or palpitations or lightheadedness ?GI: No abdominal pain or constipation ?Psych: No  depression  ?  ?Observations/Objective: ? ?Current Outpatient Medications:  ?  acetaminophen (TYLENOL) 325 MG tablet, Take 2 tablets (650 mg total) by mouth every 6 (six) hours as needed for mild pain or fever., Disp: 60 tablet, Rfl: 0 ?  apixaban (ELIQUIS) 5 MG TABS tablet, Take 1 tablet (5 mg total) by mouth 2 (two) times daily., Disp: 60 tablet, Rfl: 0 ?  calcium carbonate (OS-CAL - DOSED IN MG OF ELEMENTAL CALCIUM) 1250 (500 Ca) MG tablet, Take 2 tablets by mouth in the morning., Disp: , Rfl:  ?  diphenhydrAMINE (BENADRYL) 25 mg capsule, Take 25 mg by mouth every 6 (six) hours as needed for itching or allergies., Disp: , Rfl:  ?  docusate sodium (COLACE) 100 MG capsule, Take 200 mg by mouth at bedtime as needed for mild constipation., Disp: , Rfl:  ?  gabapentin (NEURONTIN) 100 MG capsule, Take 2 capsules (200 mg total) by mouth 3 (three) times daily., Disp: 90 capsule, Rfl: 0 ?  gabapentin (NEURONTIN) 600 MG tablet, Take 1 tablet (600 mg total) by mouth 3 (three) times daily., Disp: 90 tablet, Rfl: 3 ?  ipratropium-albuterol (DUONEB) 0.5-2.5 (3) MG/3ML SOLN, Take 3 mLs by nebulization every 6 (six) hours as needed (shortness of breath)., Disp: , Rfl:  ?  levocetirizine (XYZAL) 5 MG tablet, Take 5 mg by mouth every evening., Disp: , Rfl:  ?  nortriptyline (PAMELOR) 50 MG capsule, Take 50 mg by mouth at bedtime., Disp: , Rfl:  ?  nystatin (MYCOSTATIN) 100000 UNIT/ML suspension, Take 5 mLs by mouth 4 (four) times daily., Disp: , Rfl:  ?  nystatin  cream (MYCOSTATIN), Apply 1 application topically 2 (two) times daily. (Apply under skin folds), Disp: , Rfl:  ?  ondansetron (ZOFRAN) 4 MG tablet, Take 1 tablet (4 mg total) by mouth every 6 (six) hours as needed for nausea., Disp: 20 tablet, Rfl: 0 ?  [START ON 05/24/2021] oxyCODONE-acetaminophen (PERCOCET) 10-325 MG tablet, Take 1 tablet by mouth every 4 (four) hours as needed for pain., Disp: 150 tablet, Rfl: 0 ?  [START ON 06/25/2021] oxyCODONE-acetaminophen (PERCOCET)  10-325 MG tablet, Take 1 tablet by mouth every 4 (four) hours as needed for pain., Disp: 150 tablet, Rfl: 0 ?  pantoprazole (PROTONIX) 40 MG tablet, Take 40 mg by mouth daily., Disp: , Rfl:  ?  polyethylene glycol (MIRALAX / GLYCOLAX) 17 g packet, Take 17 g by mouth daily as needed for moderate constipation., Disp: 14 each, Rfl: 0 ?  potassium chloride SA (KLOR-CON M) 20 MEQ tablet, Take 20 mEq by mouth 2 (two) times daily., Disp: , Rfl:  ?  predniSONE (DELTASONE) 10 MG tablet, Take 20 mg by mouth in the morning., Disp: , Rfl:  ?  spironolactone (ALDACTONE) 25 MG tablet, Take 25 mg by mouth daily., Disp: , Rfl:  ?  torsemide (DEMADEX) 20 MG tablet, Take 2 tablets (40 mg) by mouth once daily (Patient taking differently: Take 60 mg by mouth every Monday, Wednesday, and Friday. Take 2 tablets (40 mg) by mouth once daily), Disp: 60 tablet, Rfl: 3 ?  Wound Cleansers (VASHE WOUND THERAPY) SOLN, Apply 1 application topically daily. (Irrigate abdominal wound), Disp: , Rfl:   ? ?Past Medical History:  ?Diagnosis Date  ? Allergic rhinitis   ? Arthritis   ? Asthma   ? problems with fumes and aerosols cause asthma  ? Chest pain 10/19/2016  ? CHF (congestive heart failure) ([place])   ? Complication of anesthesia   ? awakens during surgery; has occurred with last 3-4 surgeries   ? COPD (chronic obstructive pulmonary disease) ([place])   ? DVT (deep venous thrombosis) ([place]) 07/13/2018  ? Right leg  ? Environmental allergies   ? fumes   ? Fibromyalgia   ? GERD (gastroesophageal reflux disease)   ? Headache   ? Hemorrhoids   ? History of bronchitis   ? Hx of total knee arthroplasty 12/13/2015  ? Hyperlipidemia   ? Hypertension   ? Morbid obesity with BMI of 45.0-49.9, adult ([place]) 07/25/2015  ? NICM (nonischemic cardiomyopathy) ([place])   ? a. ? PVC mediated;  b. 06/2016 Echo: EF 25-30%, mild LVH, mild LAE, mild AI/MR/TR/PR; c. 08/2016 Cath: nl cors, EF 25%.  ? Numbness   ? hands bilat when driving; improves when not preforming task   ?  Osteoarthritis of left knee 08/02/2015  ? PE (pulmonary thromboembolism) ([place]) 07/13/2018  ? Pes anserinus bursitis of left knee 05/18/2016  ? Presence of right artificial knee joint 05/18/2016  ? PVC (premature ventricular contraction) 06/04/2017  ? PVC's (premature ventricular contractions)   ? a. 11/2016 Amio started;  b. 11/29/16 48h Holter: 11914 PVC's (41%); c. 12/2016 24h Holter: 18040 PVC's (15%); c. 02/2017 48h Holter: 37262 PVC's (41%).  ? Sleep apnea   ? cannot tolerate CPAP  ? Spinal stenosis of lumbar region 06/19/2015  ? Status post gastric banding   ? 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  ? Temporal arteritis ([place]) 05/01/2020  ? Tinnitus   ? comes and goes   ? Unilateral primary osteoarthritis, right knee 12/13/2015  ? Vertigo   ? none for  over 2 yrs  ?  ? ?Assessment and Plan: ?1. DDD (degenerative disc disease), lumbar   ?2. Chronic bilateral low back pain with bilateral sciatica   ?3. Chronic, continuous use of opioids   ?4. Chronic pain syndrome   ?5. Hip pain, bilateral   ?6. Spinal stenosis of lumbar region with neurogenic claudication   ?7. Pain in joint of right shoulder   ?8. Temporal arteritis ([place])   ?9. Primary osteoarthritis of right shoulder   ?10. Chronic pain of right hip   ?Based on our discussion today I think is appropriate to refill her medications as these continue to enable her to function reasonably well.  She is trying to ambulate with a walker at home and is attempting to stay active.  She is due for refills for May 14 and June 14.  I have reviewed the [facility] practitioner database information and it is appropriate for refill.  She denies any diverting or illicit use or side effects with the medications.  It is difficult for her to ambulate and to travel but we are requesting that she present for reevaluation here in the clinic at her next visit in 2 months.  She will be due for a routine urine screen at that time as well.  She is to continue follow-up with her  primary care physicians for baseline medical care. ? ?Follow Up Instructions: ? ?  ?I discussed the assessment and treatment plan with the patient. The patient was provided an opportunity to ask questions and all were

## 2021-06-19 ENCOUNTER — Ambulatory Visit: Payer: Medicare Other | Attending: Anesthesiology | Admitting: Anesthesiology

## 2021-06-19 ENCOUNTER — Encounter: Payer: Self-pay | Admitting: Anesthesiology

## 2021-06-19 ENCOUNTER — Other Ambulatory Visit: Payer: Self-pay

## 2021-06-19 VITALS — BP 109/84 | HR 109 | Temp 98.2°F | Resp 18 | Ht 67.0 in | Wt 306.0 lb

## 2021-06-19 DIAGNOSIS — F1190 Opioid use, unspecified, uncomplicated: Secondary | ICD-10-CM | POA: Insufficient documentation

## 2021-06-19 DIAGNOSIS — M5442 Lumbago with sciatica, left side: Secondary | ICD-10-CM | POA: Insufficient documentation

## 2021-06-19 DIAGNOSIS — G8929 Other chronic pain: Secondary | ICD-10-CM | POA: Diagnosis present

## 2021-06-19 DIAGNOSIS — M5136 Other intervertebral disc degeneration, lumbar region: Secondary | ICD-10-CM | POA: Diagnosis not present

## 2021-06-19 DIAGNOSIS — M25511 Pain in right shoulder: Secondary | ICD-10-CM | POA: Insufficient documentation

## 2021-06-19 DIAGNOSIS — M5441 Lumbago with sciatica, right side: Secondary | ICD-10-CM | POA: Diagnosis present

## 2021-06-19 DIAGNOSIS — M1712 Unilateral primary osteoarthritis, left knee: Secondary | ICD-10-CM | POA: Diagnosis present

## 2021-06-19 DIAGNOSIS — G894 Chronic pain syndrome: Secondary | ICD-10-CM | POA: Diagnosis not present

## 2021-06-19 DIAGNOSIS — M48062 Spinal stenosis, lumbar region with neurogenic claudication: Secondary | ICD-10-CM | POA: Diagnosis present

## 2021-06-19 DIAGNOSIS — M25551 Pain in right hip: Secondary | ICD-10-CM | POA: Diagnosis present

## 2021-06-19 DIAGNOSIS — M25552 Pain in left hip: Secondary | ICD-10-CM | POA: Diagnosis present

## 2021-06-19 DIAGNOSIS — M51369 Other intervertebral disc degeneration, lumbar region without mention of lumbar back pain or lower extremity pain: Secondary | ICD-10-CM

## 2021-06-19 MED ORDER — OXYCODONE-ACETAMINOPHEN 10-325 MG PO TABS: 1.0000 | ORAL_TABLET | ORAL | 0 refills | Status: AC | PRN

## 2021-06-19 MED ORDER — GABAPENTIN 600 MG PO TABS: 600.0000 mg | ORAL_TABLET | Freq: Three times a day (TID) | ORAL | 3 refills | Status: DC

## 2021-06-19 MED ORDER — OXYCODONE-ACETAMINOPHEN 10-325 MG PO TABS: 1.0000 | ORAL_TABLET | ORAL | 0 refills | Status: DC | PRN

## 2021-06-19 NOTE — Progress Notes (Signed)
Nursing Pain Medication Assessment:  Safety precautions to be maintained throughout the outpatient stay will include: orient to surroundings, keep bed in low position, maintain call bell within reach at all times, provide assistance with transfer out of bed and ambulation.  Medication Inspection Compliance: Pill count conducted under aseptic conditions, in front of the patient. Neither the pills nor the bottle was removed from the patient's sight at any time. Once count was completed pills were immediately returned to the patient in their original bottle.  Medication: Oxycodone IR Pill/Patch Count:  35 of 150 pills remain Pill/Patch Appearance: Markings consistent with prescribed medication Bottle Appearance: Standard pharmacy container. Clearly labeled. Filled Date: 05 / 14 / 2023 Last Medication intake:  Today

## 2021-06-20 NOTE — Progress Notes (Signed)
Subjective:  Patient ID: Wendy Sanchez, female    DOB: 1950/02/20  Age: 71 y.o. MRN: [id number]  CC: Back Pain (low) and Shoulder Pain (right)   Procedure: None  HPI Wendy Sanchez presents for a return visit.  Our last appointment was approximately 2 months ago.  She states that her low back pain has been problematic and she is taking her medications.  She takes approximately 5 times a day and despite her current medication management she still has frequent breakthrough pain.  She is effectively wheelchair-bound and recumbent for most of the day.  The pain that she is experiencing resides in her low back with radiation down into both hips knees and legs.  This is comparable to what she has historically experienced with no significant change.  Her lower extremity strength is at baseline but she is unable to ambulate secondary to her weight and degenerative condition overall.  She has been hospitalized several times secondary to some urosepsis and other underlying medical problems.  She takes her opioids approximately every 4 hours and does get 50% improvement.  She questions whether she might benefit from an increase in her dosing.  She is currently taking 10 mg tablets 5 times per day.  No side effects reported.  She feels that she does sleep well with the medication and they are keeping her pain under moderate and reasonable control.  Outpatient Medications Prior to Visit  Medication Sig Dispense Refill   acetaminophen (TYLENOL) 325 MG tablet Take 2 tablets (650 mg total) by mouth every 6 (six) hours as needed for mild pain or fever. 60 tablet 0   apixaban (ELIQUIS) 5 MG TABS tablet Take 1 tablet (5 mg total) by mouth 2 (two) times daily. 60 tablet 0   diphenhydrAMINE (BENADRYL) 25 mg capsule Take 25 mg by mouth every 6 (six) hours as needed for itching or allergies.     docusate sodium (COLACE) 100 MG capsule Take 200 mg by mouth at bedtime as needed for mild constipation.      ipratropium-albuterol (DUONEB) 0.5-2.5 (3) MG/3ML SOLN Take 3 mLs by nebulization every 6 (six) hours as needed (shortness of breath).     levocetirizine (XYZAL) 5 MG tablet Take 5 mg by mouth every evening.     nortriptyline (PAMELOR) 50 MG capsule Take 50 mg by mouth at bedtime.     nystatin (MYCOSTATIN) 100000 UNIT/ML suspension Take 5 mLs by mouth 4 (four) times daily.     nystatin cream (MYCOSTATIN) Apply 1 application topically 2 (two) times daily. (Apply under skin folds)     ondansetron (ZOFRAN) 4 MG tablet Take 1 tablet (4 mg total) by mouth every 6 (six) hours as needed for nausea. 20 tablet 0   [START ON 06/25/2021] oxyCODONE-acetaminophen (PERCOCET) 10-325 MG tablet Take 1 tablet by mouth every 4 (four) hours as needed for pain. 150 tablet 0   pantoprazole (PROTONIX) 40 MG tablet Take 40 mg by mouth daily.     polyethylene glycol (MIRALAX / GLYCOLAX) 17 g packet Take 17 g by mouth daily as needed for moderate constipation. 14 each 0   potassium chloride SA (KLOR-CON M) 20 MEQ tablet Take 20 mEq by mouth 2 (two) times daily.     predniSONE (DELTASONE) 10 MG tablet Take 10 mg by mouth in the morning.     spironolactone (ALDACTONE) 25 MG tablet Take 25 mg by mouth daily.     torsemide (DEMADEX) 20 MG tablet Take 2 tablets (40 mg) by  mouth once daily (Patient taking differently: Take 60 mg by mouth every Monday, Wednesday, and Friday. Take 2 tablets (40 mg) by mouth once daily) 60 tablet 3   Wound Cleansers (VASHE WOUND THERAPY) SOLN Apply 1 application topically daily. (Irrigate abdominal wound)     gabapentin (NEURONTIN) 600 MG tablet Take 1 tablet (600 mg total) by mouth 3 (three) times daily. 90 tablet 3   calcium carbonate (OS-CAL - DOSED IN MG OF ELEMENTAL CALCIUM) 1250 (500 Ca) MG tablet Take 2 tablets by mouth in the morning. (Patient not taking: Reported on 06/19/2021)     gabapentin (NEURONTIN) 100 MG capsule Take 2 capsules (200 mg total) by mouth 3 (three) times daily. (Patient not  taking: Reported on 06/19/2021) 90 capsule 0   oxyCODONE-acetaminophen (PERCOCET) 10-325 MG tablet Take 1 tablet by mouth every 4 (four) hours as needed for pain. (Patient not taking: Reported on 06/19/2021) 150 tablet 0   No facility-administered medications prior to visit.    Review of Systems CNS: No confusion or sedation Cardiac: No angina or palpitations GI: No abdominal pain or constipation Constitutional: No nausea vomiting fevers or chills  Objective:  BP 109/84   Pulse (!) 109   Temp 98.2 F (36.8 C) (Temporal)   Resp 18   Ht '5\' 7"'$  (1.702 m)   Wt (!) 306 lb (138.8 kg)   SpO2 98%   BMI 47.93 kg/m    BP Readings from Last 3 Encounters:  06/19/21 109/84  02/08/21 134/75  12/09/20 119/70     Wt Readings from Last 3 Encounters:  06/19/21 (!) 306 lb (138.8 kg)  01/30/21 299 lb 11.2 oz (135.9 kg)  11/25/20 (!) 309 lb 14.4 oz (140.6 kg)     Physical Exam Pt is alert and oriented PERRL EOMI HEART IS RRR no murmur or rub LCTA no wheezing or rales MUSCULOSKELETAL reveals diffuse muscle tenderness in the low back.  She is in a wheelchair and is unable to stand.  Her muscle tone and bulk to the lower extremities is her baseline.  Strength appears to be preserved.  She is unable to ambulate.  Labs  No results found for: "HGBA1C" Lab Results  Component Value Date   LDLCALC (H) 03/16/2007    150        Total Cholesterol/HDL:CHD Risk Coronary Heart Disease Risk Table                     Men   Women  1/2 Average Risk   3.4   3.3   CREATININE 0.71 02/04/2021    -------------------------------------------------------------------------------------------------------------------- Lab Results  Component Value Date   WBC 7.2 02/04/2021   HGB 9.3 (L) 02/04/2021   HCT 31.2 (L) 02/04/2021   PLT 514 (H) 02/04/2021   GLUCOSE 141 (H) 02/04/2021   CHOL (H) 03/16/2007    236        ATP III CLASSIFICATION:  <200     mg/dL   Desirable  200-239  mg/dL   Borderline High   >=240    mg/dL   High   TRIG 76 03/16/2007   HDL 71 03/16/2007   LDLCALC (H) 03/16/2007    150        Total Cholesterol/HDL:CHD Risk Coronary Heart Disease Risk Table                     Men   Women  1/2 Average Risk   3.4   3.3   ALT 21 02/01/2021  AST 20 02/01/2021   NA 139 02/04/2021   K 4.6 02/04/2021   CL 105 02/04/2021   CREATININE 0.71 02/04/2021   BUN 16 02/04/2021   CO2 26 02/04/2021   TSH 1.080 11/25/2020   INR 1.2 01/31/2021    --------------------------------------------------------------------------------------------------------------------- CT ABDOMEN PELVIS WO CONTRAST  Result Date: 01/30/2021 CLINICAL DATA:  Shivering and headache. On antibiotics for diverticulitis. EXAM: CT ABDOMEN AND PELVIS WITHOUT CONTRAST TECHNIQUE: Multidetector CT imaging of the abdomen and pelvis was performed following the standard protocol without IV contrast. RADIATION DOSE REDUCTION: This exam was performed according to the departmental dose-optimization program which includes automated exposure control, adjustment of the mA and/or kV according to patient size and/or use of iterative reconstruction technique. COMPARISON:  July 31, 2013 FINDINGS: Lower chest: Mild atelectasis is seen within the posterior aspects of the bilateral lung bases. Hepatobiliary: Punctate calcified granulomas are seen scattered throughout the liver parenchyma. Status post cholecystectomy. No biliary dilatation. Pancreas: Unremarkable. No pancreatic ductal dilatation or surrounding inflammatory changes. Spleen: Normal in size without focal abnormality. Adrenals/Urinary Tract: Adrenal glands are unremarkable. Kidneys are normal in size, without renal calculi or hydronephrosis. 5 mm and 10 mm hyperdense foci are seen within the right kidney. A similar appearing 7 mm hyperdense focus is noted within the posterolateral aspect of the mid to lower left kidney. Bladder is unremarkable. Stomach/Bowel: There is a very small hiatal  hernia. Surgical sutures are noted within the gastric region. Appendix appears normal. No evidence of bowel dilatation. Mildly inflamed diverticula are seen within the mid to distal descending colon. There is no evidence of associated perforation or abscess. Noninflamed diverticula are seen throughout the remainder of the large bowel. Vascular/Lymphatic: Aortic atherosclerosis. No enlarged abdominal or pelvic lymph nodes. Reproductive: Status post hysterectomy. No adnexal masses. Other: A left lower quadrant ostomy site is present. No abdominopelvic ascites. Musculoskeletal: Multilevel degenerative changes are seen throughout the lumbar spine with a chronic fracture deformity seen along the superior endplate of the U20 vertebral body. IMPRESSION: 1. Mild diverticulitis involving the mid to distal descending colon. 2. Evidence of prior gastric surgery and left lower quadrant ostomy site. 3. Small hiatal hernia. 4. Findings likely consistent with prior cholecystectomy and hysterectomy. 5. Chronic fracture deformity along the superior endplate of the U54 vertebral body. 6. Aortic atherosclerosis. Aortic Atherosclerosis (ICD10-I70.0). Electronically Signed   By: Virgina Norfolk M.D.   On: 01/30/2021 22:08   DG Chest 2 View  Result Date: 01/30/2021 CLINICAL DATA:  Best images obtainable due to patients condition. Pt [facility] for chills and generalized bodyaches. Pt alert and oriented. NAD noted Pt currently on antibiotics for "infection"infection EXAM: CHEST - 2 VIEW COMPARISON:  None. FINDINGS: RIGHT PICC line tip in distal SVC. Stable large cardiac silhouette. Mild central venous congestion. No focal infiltrate. No pneumothorax. IMPRESSION: Central venous congestion.  Low lung volumes. Electronically Signed   By: Suzy Bouchard M.D.   On: 01/30/2021 18:22     Assessment & Plan:   Mckenlee was seen today for back pain and shoulder pain.  Diagnoses and all orders for this  visit:  DDD (degenerative disc disease), lumbar  Chronic bilateral low back pain with bilateral sciatica  Chronic, continuous use of opioids -     ToxASSURE Select 13 (MW), Urine  Chronic pain syndrome -     ToxASSURE Select 13 (MW), Urine  Hip pain, bilateral  Spinal stenosis of lumbar region with neurogenic claudication  Pain in joint of right shoulder  Chronic pain of right hip  Primary osteoarthritis of left knee  Other orders -     oxyCODONE-acetaminophen (PERCOCET) 10-325 MG tablet; Take 1 tablet by mouth every 4 (four) hours as needed for pain. -     oxyCODONE-acetaminophen (PERCOCET) 10-325 MG tablet; Take 1 tablet by mouth every 4 (four) hours as needed for pain. -     gabapentin (NEURONTIN) 600 MG tablet; Take 1 tablet (600 mg total) by mouth 3 (three) times daily.        ----------------------------------------------------------------------------------------------------------------------  Problem List Items Addressed This Visit       Unprioritized   Chronic low back pain   Relevant Medications   oxyCODONE-acetaminophen (PERCOCET) 10-325 MG tablet (Start on 07/25/2021)   oxyCODONE-acetaminophen (PERCOCET) 10-325 MG tablet (Start on 08/24/2021)   gabapentin (NEURONTIN) 600 MG tablet   Chronic pain syndrome   Relevant Medications   oxyCODONE-acetaminophen (PERCOCET) 10-325 MG tablet (Start on 07/25/2021)   oxyCODONE-acetaminophen (PERCOCET) 10-325 MG tablet (Start on 08/24/2021)   gabapentin (NEURONTIN) 600 MG tablet   Other Relevant Orders   ToxASSURE Select 13 (MW), Urine   Chronic, continuous use of opioids   Relevant Orders   ToxASSURE Select 13 (MW), Urine   DDD (degenerative disc disease), lumbar - Primary   Relevant Medications   oxyCODONE-acetaminophen (PERCOCET) 10-325 MG tablet (Start on 07/25/2021)   oxyCODONE-acetaminophen (PERCOCET) 10-325 MG tablet (Start on 08/24/2021)   Hip pain, bilateral   Osteoarthritis of left knee   Relevant Medications    oxyCODONE-acetaminophen (PERCOCET) 10-325 MG tablet (Start on 07/25/2021)   oxyCODONE-acetaminophen (PERCOCET) 10-325 MG tablet (Start on 08/24/2021)   Pain in joint, shoulder region   Spinal stenosis of lumbar region   Other Visit Diagnoses     Chronic pain of right hip       Relevant Medications   oxyCODONE-acetaminophen (PERCOCET) 10-325 MG tablet (Start on 07/25/2021)   oxyCODONE-acetaminophen (PERCOCET) 10-325 MG tablet (Start on 08/24/2021)   gabapentin (NEURONTIN) 600 MG tablet         ----------------------------------------------------------------------------------------------------------------------  1. DDD (degenerative disc disease), lumbar At present, we will continue her current therapy.  I do not think that she warrants an increase in medication.  I will have this discussion with her.  I think would be of limited benefit.  We have talked about tolerance and desensitization and she desires to continue with her current therapy.  She would be a candidate for increasing her Tylenol at 1000 mg an additional 2 times per day while keeping this below the 4000 mg daily usage limits.  No other changes will be initiated today.  She cannot take NSAIDs secondary to her antiplatelet medications.  2. Chronic bilateral low back pain with bilateral sciatica As above and continue stretching as tolerated  3. Chronic, continuous use of opioids Per routine - ToxASSURE Select 13 (MW), Urine  4. Chronic pain syndrome  - ToxASSURE Select 13 (MW), Urine  5. Hip pain, bilateral   6. Spinal stenosis of lumbar region with neurogenic claudication   7. Pain in joint of right shoulder   8. Chronic pain of right hip   9. Primary osteoarthritis of left knee As above and continue follow-up with her primary care physicians for baseline medical care.  We will schedule her for return to clinic in  2    ----------------------------------------------------------------------------------------------------------------------  I am having [name] T. Turrell start on oxyCODONE-acetaminophen. I am also having her maintain her nortriptyline, levocetirizine, diphenhydrAMINE, pantoprazole, apixaban, nystatin cream, ipratropium-albuterol, torsemide, predniSONE, spironolactone, calcium carbonate,  docusate sodium, Vashe Wound Therapy, polyethylene glycol, acetaminophen, nystatin, potassium chloride SA, gabapentin, ondansetron, oxyCODONE-acetaminophen, oxyCODONE-acetaminophen, and gabapentin.   Meds ordered this encounter  Medications   oxyCODONE-acetaminophen (PERCOCET) 10-325 MG tablet    Sig: Take 1 tablet by mouth every 4 (four) hours as needed for pain.    Dispense:  150 tablet    Refill:  0   oxyCODONE-acetaminophen (PERCOCET) 10-325 MG tablet    Sig: Take 1 tablet by mouth every 4 (four) hours as needed for pain.    Dispense:  150 tablet    Refill:  0   gabapentin (NEURONTIN) 600 MG tablet    Sig: Take 1 tablet (600 mg total) by mouth 3 (three) times daily.    Dispense:  90 tablet    Refill:  3   Patient's Medications  New Prescriptions   OXYCODONE-ACETAMINOPHEN (PERCOCET) 10-325 MG TABLET    Take 1 tablet by mouth every 4 (four) hours as needed for pain.  Previous Medications   ACETAMINOPHEN (TYLENOL) 325 MG TABLET    Take 2 tablets (650 mg total) by mouth every 6 (six) hours as needed for mild pain or fever.   APIXABAN (ELIQUIS) 5 MG TABS TABLET    Take 1 tablet (5 mg total) by mouth 2 (two) times daily.   CALCIUM CARBONATE (OS-CAL - DOSED IN MG OF ELEMENTAL CALCIUM) 1250 (500 CA) MG TABLET    Take 2 tablets by mouth in the morning.   DIPHENHYDRAMINE (BENADRYL) 25 MG CAPSULE    Take 25 mg by mouth every 6 (six) hours as needed for itching or allergies.   DOCUSATE SODIUM (COLACE) 100 MG CAPSULE    Take 200 mg by mouth at bedtime as needed for mild constipation.   GABAPENTIN (NEURONTIN)  100 MG CAPSULE    Take 2 capsules (200 mg total) by mouth 3 (three) times daily.   IPRATROPIUM-ALBUTEROL (DUONEB) 0.5-2.5 (3) MG/3ML SOLN    Take 3 mLs by nebulization every 6 (six) hours as needed (shortness of breath).   LEVOCETIRIZINE (XYZAL) 5 MG TABLET    Take 5 mg by mouth every evening.   NORTRIPTYLINE (PAMELOR) 50 MG CAPSULE    Take 50 mg by mouth at bedtime.   NYSTATIN (MYCOSTATIN) 100000 UNIT/ML SUSPENSION    Take 5 mLs by mouth 4 (four) times daily.   NYSTATIN CREAM (MYCOSTATIN)    Apply 1 application topically 2 (two) times daily. (Apply under skin folds)   ONDANSETRON (ZOFRAN) 4 MG TABLET    Take 1 tablet (4 mg total) by mouth every 6 (six) hours as needed for nausea.   OXYCODONE-ACETAMINOPHEN (PERCOCET) 10-325 MG TABLET    Take 1 tablet by mouth every 4 (four) hours as needed for pain.   PANTOPRAZOLE (PROTONIX) 40 MG TABLET    Take 40 mg by mouth daily.   POLYETHYLENE GLYCOL (MIRALAX / GLYCOLAX) 17 G PACKET    Take 17 g by mouth daily as needed for moderate constipation.   POTASSIUM CHLORIDE SA (KLOR-CON M) 20 MEQ TABLET    Take 20 mEq by mouth 2 (two) times daily.   PREDNISONE (DELTASONE) 10 MG TABLET    Take 10 mg by mouth in the morning.   SPIRONOLACTONE (ALDACTONE) 25 MG TABLET    Take 25 mg by mouth daily.   TORSEMIDE (DEMADEX) 20 MG TABLET    Take 2 tablets (40 mg) by mouth once daily   WOUND CLEANSERS (VASHE WOUND THERAPY) SOLN    Apply 1 application topically daily. (Irrigate abdominal  wound)  Modified Medications   Modified Medication Previous Medication   GABAPENTIN (NEURONTIN) 600 MG TABLET gabapentin (NEURONTIN) 600 MG tablet      Take 1 tablet (600 mg total) by mouth 3 (three) times daily.    Take 1 tablet (600 mg total) by mouth 3 (three) times daily.   OXYCODONE-ACETAMINOPHEN (PERCOCET) 10-325 MG TABLET oxyCODONE-acetaminophen (PERCOCET) 10-325 MG tablet      Take 1 tablet by mouth every 4 (four) hours as needed for pain.    Take 1 tablet by mouth every 4 (four) hours  as needed for pain.  Discontinued Medications   No medications on file   ----------------------------------------------------------------------------------------------------------------------  Follow-up: Return in about 2 months (around 08/19/2021) for evaluation, med refill.  Is  Molli Barrows, MD

## 2021-06-25 LAB — TOXASSURE SELECT 13 (MW), URINE

## 2021-08-08 ENCOUNTER — Telehealth: Payer: Self-pay | Admitting: Oncology

## 2021-08-08 NOTE — Telephone Encounter (Signed)
Referral received from [facility] for patient to f/u with [name]. Unable to reach patient at both of her listed phone numbers. Message left with emergency contact. Will schedule appointment and send information in the mail.

## 2021-08-18 ENCOUNTER — Ambulatory Visit: Payer: Medicare Other | Attending: Anesthesiology | Admitting: Anesthesiology

## 2021-08-18 ENCOUNTER — Encounter: Payer: Self-pay | Admitting: Anesthesiology

## 2021-08-18 DIAGNOSIS — M5441 Lumbago with sciatica, right side: Secondary | ICD-10-CM

## 2021-08-18 DIAGNOSIS — G894 Chronic pain syndrome: Secondary | ICD-10-CM

## 2021-08-18 DIAGNOSIS — M25511 Pain in right shoulder: Secondary | ICD-10-CM

## 2021-08-18 DIAGNOSIS — M1712 Unilateral primary osteoarthritis, left knee: Secondary | ICD-10-CM

## 2021-08-18 DIAGNOSIS — M5442 Lumbago with sciatica, left side: Secondary | ICD-10-CM | POA: Diagnosis not present

## 2021-08-18 DIAGNOSIS — M51369 Other intervertebral disc degeneration, lumbar region without mention of lumbar back pain or lower extremity pain: Secondary | ICD-10-CM

## 2021-08-18 DIAGNOSIS — I5032 Chronic diastolic (congestive) heart failure: Secondary | ICD-10-CM

## 2021-08-18 DIAGNOSIS — M5136 Other intervertebral disc degeneration, lumbar region: Secondary | ICD-10-CM | POA: Diagnosis not present

## 2021-08-18 DIAGNOSIS — F1190 Opioid use, unspecified, uncomplicated: Secondary | ICD-10-CM

## 2021-08-18 DIAGNOSIS — G8929 Other chronic pain: Secondary | ICD-10-CM

## 2021-08-18 DIAGNOSIS — M25551 Pain in right hip: Secondary | ICD-10-CM

## 2021-08-18 DIAGNOSIS — M48062 Spinal stenosis, lumbar region with neurogenic claudication: Secondary | ICD-10-CM

## 2021-08-18 DIAGNOSIS — M25552 Pain in left hip: Secondary | ICD-10-CM

## 2021-08-18 MED ORDER — OXYCODONE-ACETAMINOPHEN 10-325 MG PO TABS
1.0000 | ORAL_TABLET | ORAL | 0 refills | Status: DC | PRN
Start: 2021-09-23 — End: 2021-10-23

## 2021-08-18 MED ORDER — GABAPENTIN 600 MG PO TABS: 600.0000 mg | ORAL_TABLET | Freq: Three times a day (TID) | ORAL | 3 refills | Status: DC

## 2021-08-18 NOTE — Progress Notes (Signed)
Virtual Visit via Telephone Note  I connected with Wendy Sanchez on 08/18/21 at  8:15 AM EDT by telephone and verified that I am speaking with the correct person using two identifiers.  Location: Patient: Home Provider: Pain control [facility]   I discussed the limitations, risks, security and privacy concerns of performing an evaluation and management service by telephone and the availability of in person appointments. I also discussed with the patient that there may be a patient responsible charge related to this service. The patient expressed understanding and agreed to proceed.   History of Present Illness: I spoke to Wendy Sanchez via telephone as we are unable lengthen the video portion of the conference.  She reports that she is doing better since her last visit.  She is reporting better cardiac function and better renal function following her recent bout of congestive heart failure.  She has responded to antibiotic therapy and her infection is improving.  She still taking her medications for pain management which has been a chronic ongoing problem for her.  She tolerates these well.  No side effects reported with the medicines.  The quality characteristic and distribution of her low back pain and leg pain and diffuse body pain is stable as well.  She is no longer taking any anti-inflammatories secondary to her other medication management.  She feels like she is doing the best she has done in quite some time at present.  No side effects of the medications are noted.  Review of systems: General: No fevers or chills Pulmonary: No shortness of breath or dyspnea Cardiac: No angina or palpitations or lightheadedness GI: No abdominal pain or constipation Psych: No depression    Observations/Objective:  Current Outpatient Medications:    acetaminophen (TYLENOL) 325 MG tablet, Take 2 tablets (650 mg total) by mouth every 6 (six) hours as needed for mild pain or fever., Disp: 60 tablet, Rfl: 0    apixaban (ELIQUIS) 5 MG TABS tablet, Take 1 tablet (5 mg total) by mouth 2 (two) times daily., Disp: 60 tablet, Rfl: 0   calcium carbonate (OS-CAL - DOSED IN MG OF ELEMENTAL CALCIUM) 1250 (500 Ca) MG tablet, Take 2 tablets by mouth in the morning. (Patient not taking: Reported on 06/19/2021), Disp: , Rfl:    diphenhydrAMINE (BENADRYL) 25 mg capsule, Take 25 mg by mouth every 6 (six) hours as needed for itching or allergies., Disp: , Rfl:    docusate sodium (COLACE) 100 MG capsule, Take 200 mg by mouth at bedtime as needed for mild constipation., Disp: , Rfl:    gabapentin (NEURONTIN) 100 MG capsule, Take 2 capsules (200 mg total) by mouth 3 (three) times daily. (Patient not taking: Reported on 06/19/2021), Disp: 90 capsule, Rfl: 0   gabapentin (NEURONTIN) 600 MG tablet, Take 1 tablet (600 mg total) by mouth 3 (three) times daily., Disp: 90 tablet, Rfl: 3   ipratropium-albuterol (DUONEB) 0.5-2.5 (3) MG/3ML SOLN, Take 3 mLs by nebulization every 6 (six) hours as needed (shortness of breath)., Disp: , Rfl:    levocetirizine (XYZAL) 5 MG tablet, Take 5 mg by mouth every evening., Disp: , Rfl:    nortriptyline (PAMELOR) 50 MG capsule, Take 50 mg by mouth at bedtime., Disp: , Rfl:    nystatin (MYCOSTATIN) 100000 UNIT/ML suspension, Take 5 mLs by mouth 4 (four) times daily., Disp: , Rfl:    nystatin cream (MYCOSTATIN), Apply 1 application topically 2 (two) times daily. (Apply under skin folds), Disp: , Rfl:    ondansetron (ZOFRAN)  4 MG tablet, Take 1 tablet (4 mg total) by mouth every 6 (six) hours as needed for nausea., Disp: 20 tablet, Rfl: 0   [START ON 08/24/2021] oxyCODONE-acetaminophen (PERCOCET) 10-325 MG tablet, Take 1 tablet by mouth every 4 (four) hours as needed for pain., Disp: 150 tablet, Rfl: 0   [START ON 09/23/2021] oxyCODONE-acetaminophen (PERCOCET) 10-325 MG tablet, Take 1 tablet by mouth every 4 (four) hours as needed for pain., Disp: 150 tablet, Rfl: 0   pantoprazole (PROTONIX) 40 MG tablet,  Take 40 mg by mouth daily., Disp: , Rfl:    polyethylene glycol (MIRALAX / GLYCOLAX) 17 g packet, Take 17 g by mouth daily as needed for moderate constipation., Disp: 14 each, Rfl: 0   potassium chloride SA (KLOR-CON M) 20 MEQ tablet, Take 20 mEq by mouth 2 (two) times daily., Disp: , Rfl:    predniSONE (DELTASONE) 10 MG tablet, Take 10 mg by mouth in the morning., Disp: , Rfl:    spironolactone (ALDACTONE) 25 MG tablet, Take 25 mg by mouth daily., Disp: , Rfl:    torsemide (DEMADEX) 20 MG tablet, Take 2 tablets (40 mg) by mouth once daily (Patient taking differently: Take 60 mg by mouth every Monday, Wednesday, and Friday. Take 2 tablets (40 mg) by mouth once daily), Disp: 60 tablet, Rfl: 3   Wound Cleansers (VASHE WOUND THERAPY) SOLN, Apply 1 application topically daily. (Irrigate abdominal wound), Disp: , Rfl:    Past Medical History:  Diagnosis Date   Allergic rhinitis    Arthritis    Asthma    problems with fumes and aerosols cause asthma   Chest pain 10/19/2016   CHF (congestive heart failure) (HCC)    Complication of anesthesia    awakens during surgery; has occurred with last 3-4 surgeries    COPD (chronic obstructive pulmonary disease) ([place])    DVT (deep venous thrombosis) (HCC) 07/13/2018   Right leg   Environmental allergies    fumes    Fibromyalgia    GERD (gastroesophageal reflux disease)    Headache    Hemorrhoids    History of bronchitis    Hx of total knee arthroplasty 12/13/2015   Hyperlipidemia    Hypertension    Morbid obesity with BMI of 45.0-49.9, adult ([place]) 07/25/2015   NICM (nonischemic cardiomyopathy) ([place])    a. ? PVC mediated;  b. 06/2016 Echo: EF 25-30%, mild LVH, mild LAE, mild AI/MR/TR/PR; c. 08/2016 Cath: nl cors, EF 25%.   Numbness    hands bilat when driving; improves when not preforming task    Osteoarthritis of left knee 08/02/2015   PE (pulmonary thromboembolism) ([place]) 07/13/2018   Pes anserinus bursitis of left knee 05/18/2016   Presence of right  artificial knee joint 05/18/2016   PVC (premature ventricular contraction) 06/04/2017   PVC's (premature ventricular contractions)    a. 11/2016 Amio started;  b. 11/29/16 48h Holter: 12878 PVC's (41%); c. 12/2016 24h Holter: 18040 PVC's (15%); c. 02/2017 48h Holter: 37262 PVC's (41%).   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   Temporal arteritis ([place]) 05/01/2020   Tinnitus    comes and goes    Unilateral primary osteoarthritis, right knee 12/13/2015   Vertigo    none for over 2 yrs     Assessment and Plan: 1. DDD (degenerative disc disease), lumbar   2. Chronic bilateral low  back pain with bilateral sciatica   3. Chronic, continuous use of opioids   4. Chronic pain syndrome   5. Hip pain, bilateral   6. Spinal stenosis of lumbar region with neurogenic claudication   7. Pain in joint of right shoulder   8. Chronic pain of right hip   9. Primary osteoarthritis of left knee   10. Chronic diastolic CHF (congestive heart failure) (HCC)   Based on our discussion today I think it is appropriate to refill her medicines for the next 2 months.  He is to be dated for August 13 and September 12.  I also keep her on her gabapentin 600 mg 3 times a day which seems to be working well for her peripheral nerve pain.  No other changes will be initiated today.  I encouraged her to continue efforts at weight loss and fluid restriction as per primary care team.  Continue follow-up with her cardiologist and nephrologist and primary care physician for baseline medical care.  Continue efforts at stretching strengthening as tolerated with return to clinic scheduled in 2 months.  Follow Up Instructions:    I discussed the assessment and treatment plan with the patient. The patient was provided an opportunity to ask questions and all were answered. The patient agreed with the plan and  demonstrated an understanding of the instructions.   The patient was advised to call back or seek an in-person evaluation if the symptoms worsen or if the condition fails to improve as anticipated.  I provided 30 minutes of non-face-to-face time during this encounter.   Molli Barrows, MD

## 2021-09-10 ENCOUNTER — Encounter: Payer: Self-pay | Admitting: Oncology

## 2021-09-10 ENCOUNTER — Inpatient Hospital Stay: Payer: Medicare Other | Attending: Oncology

## 2021-09-10 ENCOUNTER — Inpatient Hospital Stay (HOSPITAL_BASED_OUTPATIENT_CLINIC_OR_DEPARTMENT_OTHER): Payer: Medicare Other | Admitting: Oncology

## 2021-09-10 VITALS — BP 80/67 | HR 72 | Temp 98.4°F | Resp 16 | Ht 67.0 in | Wt 303.6 lb

## 2021-09-10 DIAGNOSIS — Z86718 Personal history of other venous thrombosis and embolism: Secondary | ICD-10-CM | POA: Insufficient documentation

## 2021-09-10 DIAGNOSIS — Z87891 Personal history of nicotine dependence: Secondary | ICD-10-CM | POA: Diagnosis not present

## 2021-09-10 DIAGNOSIS — Z9049 Acquired absence of other specified parts of digestive tract: Secondary | ICD-10-CM | POA: Diagnosis not present

## 2021-09-10 DIAGNOSIS — Z7901 Long term (current) use of anticoagulants: Secondary | ICD-10-CM | POA: Insufficient documentation

## 2021-09-10 DIAGNOSIS — Z86711 Personal history of pulmonary embolism: Secondary | ICD-10-CM

## 2021-09-10 DIAGNOSIS — D649 Anemia, unspecified: Secondary | ICD-10-CM | POA: Diagnosis present

## 2021-09-10 DIAGNOSIS — Z9884 Bariatric surgery status: Secondary | ICD-10-CM | POA: Diagnosis not present

## 2021-09-10 DIAGNOSIS — Z885 Allergy status to narcotic agent status: Secondary | ICD-10-CM | POA: Insufficient documentation

## 2021-09-10 DIAGNOSIS — Z888 Allergy status to other drugs, medicaments and biological substances status: Secondary | ICD-10-CM | POA: Diagnosis not present

## 2021-09-10 DIAGNOSIS — Z833 Family history of diabetes mellitus: Secondary | ICD-10-CM | POA: Diagnosis not present

## 2021-09-10 DIAGNOSIS — Z803 Family history of malignant neoplasm of breast: Secondary | ICD-10-CM | POA: Diagnosis not present

## 2021-09-10 DIAGNOSIS — Z8249 Family history of ischemic heart disease and other diseases of the circulatory system: Secondary | ICD-10-CM | POA: Diagnosis not present

## 2021-09-10 DIAGNOSIS — Z818 Family history of other mental and behavioral disorders: Secondary | ICD-10-CM | POA: Insufficient documentation

## 2021-09-10 DIAGNOSIS — Z79899 Other long term (current) drug therapy: Secondary | ICD-10-CM | POA: Insufficient documentation

## 2021-09-10 LAB — CBC WITH DIFFERENTIAL/PLATELET
Abs Immature Granulocytes: 0.01 10*3/uL (ref 0.00–0.07)
Basophils Absolute: 0 10*3/uL (ref 0.0–0.1)
Basophils Relative: 1 %
Eosinophils Absolute: 0.1 10*3/uL (ref 0.0–0.5)
Eosinophils Relative: 4 %
HCT: 40.7 % (ref 36.0–46.0)
Hemoglobin: 11.7 g/dL — ABNORMAL LOW (ref 12.0–15.0)
Immature Granulocytes: 0 %
Lymphocytes Relative: 32 %
Lymphs Abs: 1 10*3/uL (ref 0.7–4.0)
MCH: 23.7 pg — ABNORMAL LOW (ref 26.0–34.0)
MCHC: 28.7 g/dL — ABNORMAL LOW (ref 30.0–36.0)
MCV: 82.6 fL (ref 80.0–100.0)
Monocytes Absolute: 0.5 10*3/uL (ref 0.1–1.0)
Monocytes Relative: 14 %
Neutro Abs: 1.6 10*3/uL — ABNORMAL LOW (ref 1.7–7.7)
Neutrophils Relative %: 49 %
Platelets: 340 10*3/uL (ref 150–400)
RBC: 4.93 MIL/uL (ref 3.87–5.11)
RDW: 19.9 % — ABNORMAL HIGH (ref 11.5–15.5)
WBC: 3.2 10*3/uL — ABNORMAL LOW (ref 4.0–10.5)
nRBC: 0 % (ref 0.0–0.2)

## 2021-09-10 LAB — IRON AND TIBC
Iron: 54 ug/dL (ref 28–170)
Saturation Ratios: 19 % (ref 10.4–31.8)
TIBC: 280 ug/dL (ref 250–450)
UIBC: 226 ug/dL

## 2021-09-10 LAB — COMPREHENSIVE METABOLIC PANEL
ALT: 13 U/L (ref 0–44)
AST: 23 U/L (ref 15–41)
Albumin: 2.9 g/dL — ABNORMAL LOW (ref 3.5–5.0)
Alkaline Phosphatase: 77 U/L (ref 38–126)
Anion gap: 7 (ref 5–15)
BUN: 22 mg/dL (ref 8–23)
CO2: 29 mmol/L (ref 22–32)
Calcium: 8.7 mg/dL — ABNORMAL LOW (ref 8.9–10.3)
Chloride: 105 mmol/L (ref 98–111)
Creatinine, Ser: 1.37 mg/dL — ABNORMAL HIGH (ref 0.44–1.00)
GFR, Estimated: 42 mL/min — ABNORMAL LOW (ref >60)
Glucose, Bld: 95 mg/dL (ref 70–99)
Potassium: 4.1 mmol/L (ref 3.5–5.1)
Sodium: 141 mmol/L (ref 135–145)
Total Bilirubin: 0.2 mg/dL — ABNORMAL LOW (ref 0.3–1.2)
Total Protein: 6.6 g/dL (ref 6.5–8.1)

## 2021-09-10 LAB — FERRITIN: Ferritin: 28 ng/mL (ref 11–307)

## 2021-09-10 NOTE — Progress Notes (Signed)
Hematology/Oncology Consult note [facility]  [phone] [phone]  Patient Care Team: Crissie Figures, Hershal Coria as PCP - General (Physician Assistant) Deboraha Sprang, MD as PCP - Electrophysiology (Cardiology) Yolonda Kida, MD as PCP - Cardiology (Cardiology)   Name of the patient: Wendy Sanchez  [id number]  1950/04/20   Date of visit: 09/10/21  Diagnosis-history of pulmonary embolism on Eliquis Normocytic anemia likely secondary to chronic disease and a component of iron deficiency  Chief complaint/ Reason for visit-routine follow-up of PE on Eliquis  Heme/Onc history: Patient is a 71 year old female with a past medical history significant for COPD nonischemic cardiomyopathy with an EF of 50% peripheral vascular disease and a former smoker.  She has a history of DVT and PE for which she has been on Eliquis for a while.  She recently had perforated sigmoid diverticulitis in September 2022 s/p sigmoidectomy and colostomy placement at [facility].  Recently admitted to [facility] for septic shock   Her PE was diagnosed in 2020 following prolonged immobilization.  CT angio chest in July 2020 was positive for acute PE with CT evidence of right heart strain and probable pulmonary arterial hypertension at least consistent with submassive PE.  She had undergone a pulmonary thrombectomy at that time. Bilateral lower extremity ultrasound was positive for acute DVT involving the right popliteal vein.  Recent echocardiogram in October 2022 showed an EF of 60 to 65% with a normal left ventricular function and normal right ventricular systolic function and normal pulmonary arterial systolic pressure.   Patient states that although her Eliquis was stopped in the hospital it was restarted upon discharge and she is continuing to take it without any significant side effects.  Given obesity and limited mobility plan was to continue Eliquis indefinitely.     Interval  history-Patient has had multiple hospitalizations in theLast few months.  Patient is presently doing well and other than mild fatigue she denies other complaints at this time.  She has colostomy in place which is functioning well.  She is ports occasional abdominal pain but denies any fever  ECOG PS- 2 Pain scale- 0   Review of systems- Review of Systems  Constitutional:  Positive for malaise/fatigue. Negative for chills, fever and weight loss.  HENT:  Negative for congestion, ear discharge and nosebleeds.   Eyes:  Negative for blurred vision.  Respiratory:  Negative for cough, hemoptysis, sputum production, shortness of breath and wheezing.   Cardiovascular:  Negative for chest pain, palpitations, orthopnea and claudication.  Gastrointestinal:  Negative for abdominal pain, blood in stool, constipation, diarrhea, heartburn, melena, nausea and vomiting.  Genitourinary:  Negative for dysuria, flank pain, frequency, hematuria and urgency.  Musculoskeletal:  Negative for back pain, joint pain and myalgias.  Skin:  Negative for rash.  Neurological:  Negative for dizziness, tingling, focal weakness, seizures, weakness and headaches.  Endo/Heme/Allergies:  Does not bruise/bleed easily.  Psychiatric/Behavioral:  Negative for depression and suicidal ideas. The patient does not have insomnia.       Allergies  Allergen Reactions   Hydrocodone-Acetaminophen Swelling    hands   Iodine Anaphylaxis and Swelling    Iv dye 02/03/21-Ispoke to patient and she had IV contrast  X4 last year and has had no problem. ( Last CT abdomen with contrast was on 01/02/21.-    Other Other (See Comments)    ALLERGY TO METAL - BLACKENS SKIN AND CAUSES A RASH    Tape Swelling and Other (See Comments)  Skin comes off.  Paper tape is ok tegaderm OK   Peanut Oil Other (See Comments)    ** Nuts cause runny nose   Shellfish Allergy Nausea And Vomiting and Swelling    Episode of GI infection after eating clam chowder.  Still eats shrimp and other seafood     Past Medical History:  Diagnosis Date   Allergic rhinitis    Arthritis    Asthma    problems with fumes and aerosols cause asthma   Chest pain 10/19/2016   CHF (congestive heart failure) (HCC)    Complication of anesthesia    awakens during surgery; has occurred with last 3-4 surgeries    COPD (chronic obstructive pulmonary disease) ([place])    DVT (deep venous thrombosis) (HCC) 07/13/2018   Right leg   Environmental allergies    fumes    Fibromyalgia    GERD (gastroesophageal reflux disease)    Headache    History of bronchitis    Hx of total knee arthroplasty 12/13/2015   Hyperlipidemia    Hypertension    Morbid obesity with BMI of 45.0-49.9, adult ([place]) 07/25/2015   NICM (nonischemic cardiomyopathy) ([place])    a. ? PVC mediated;  b. 06/2016 Echo: EF 25-30%, mild LVH, mild LAE, mild AI/MR/TR/PR; c. 08/2016 Cath: nl cors, EF 25%.   Numbness    hands bilat when driving; improves when not preforming task    Osteoarthritis of left knee 08/02/2015   PE (pulmonary thromboembolism) ([place]) 07/13/2018   Pes anserinus bursitis of left knee 05/18/2016   Presence of right artificial knee joint 05/18/2016   PVC (premature ventricular contraction) 06/04/2017   PVC's (premature ventricular contractions)    a. 11/2016 Amio started;  b. 11/29/16 48h Holter: 01093 PVC's (41%); c. 12/2016 24h Holter: 18040 PVC's (15%); c. 02/2017 48h Holter: 37262 PVC's (41%).   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   Temporal arteritis ([place]) 05/01/2020   Tinnitus    comes and goes    Unilateral primary osteoarthritis, right knee 12/13/2015   Vertigo    none for over 2 yrs     Past Surgical History:  Procedure Laterality Date   ABDOMINAL HYSTERECTOMY  1979   left ovary remains   ABDOMINAL HYSTERECTOMY     ARTERY BIOPSY Left 06/07/2019    Procedure: BIOPSY TEMPORAL ARTERY;  Surgeon: Algernon Huxley, MD;  Location: [facility] ORS;  Service: Vascular;  Laterality: Left;   CHOLECYSTECTOMY     COLONOSCOPY     COLONOSCOPY N/A 10/30/2019   Procedure: COLONOSCOPY;  Surgeon: Lesly Rubenstein, MD;  Location: [facility] ENDOSCOPY;  Service: Endoscopy;  Laterality: N/A;   ESOPHAGOGASTRODUODENOSCOPY N/A 10/30/2019   Procedure: ESOPHAGOGASTRODUODENOSCOPY (EGD);  Surgeon: Lesly Rubenstein, MD;  Location: [facility];  Service: Endoscopy;  Laterality: N/A;   fibrous tissue removed from right shoulder and back of neck      2 years ago    [place] N/A 02/28/2019   Procedure: SALIVARY GLAND BIOPSY;  Surgeon: Carloyn Manner, MD;  Location: [facility] ORS;  Service: ENT;  Laterality: N/A;   FRACTURE SURGERY     left small finger   GANGLION CYST EXCISION     GASTRIC BYPASS  1980   states had bypass with banding and band left in -   GASTRIC BYPASS OPEN     JOINT REPLACEMENT Bilateral  2017   knees   KNEE SURGERY Bilateral    both knees  2001   NECK SURGERY N/A 1205/19   ganglion cyst removal, [place]    PARTIAL COLECTOMY  09/2020   PULMONARY THROMBECTOMY Bilateral 07/14/2018   Procedure: PULMONARY THROMBECTOMY;  Surgeon: Algernon Huxley, MD;  Location: [place] [facility];  Service: Cardiovascular;  Laterality: Bilateral;   PVC ABLATION N/A 06/04/2017   Procedure: PVC ABLATION;  Surgeon: Evans Lance, MD;  Location: [place] [facility];  Service: Cardiovascular;  Laterality: N/A;   RIGHT/LEFT HEART CATH AND CORONARY ANGIOGRAPHY Bilateral 08/12/2016   Procedure: Right/Left Heart Cath and Coronary Angiography;  Surgeon: Yolonda Kida, MD;  Location: [place] [facility];  Service: Cardiovascular;  Laterality: Bilateral;   TONSILLECTOMY     age 59   TOTAL KNEE ARTHROPLASTY Left 08/02/2015   Procedure: LEFT TOTAL KNEE ARTHROPLASTY;  Surgeon: Mcarthur Rossetti, MD;  Location: [facility] ORS;  Service: Orthopedics;  Laterality: Left;    TOTAL KNEE ARTHROPLASTY Right 12/13/2015   Procedure: RIGHT TOTAL KNEE ARTHROPLASTY;  Surgeon: Mcarthur Rossetti, MD;  Location: [facility] OR[facility];  Service: Orthopedics;  Laterality: Right;    Social History   Socioeconomic History   Marital status: Single    Spouse name: Not on file   Number of children: Not on file   Years of education: Not on file   Highest education level: Not on file  Occupational History   Not on file  Tobacco Use   Smoking status: Former    Packs/day: 0.25    Years: 50.00    Total pack years: 12.50    Types: Cigarettes    Quit date: 08/06/2020    Years since quitting: 1.0   Smokeless tobacco: Current    Types: Chew  Vaping Use   Vaping Use: Never used  Substance and Sexual Activity   Alcohol use: No    Alcohol/week: 0.0 standard drinks of alcohol   Drug use: No   Sexual activity: Not Currently  Other Topics Concern   Not on file  Social History Narrative   Not on file   Social Determinants of Health   Financial Resource Strain: Not on file  Food Insecurity: Not on file  Transportation Needs: Not on file  Physical Activity: Not on file  Stress: Not on file  Social Connections: Not on file  Intimate Partner Violence: Not on file    Family History  Problem Relation Age of Onset   Hypertension Father    Deep vein thrombosis Father    Dementia Mother    Diabetes Sister    Congestive Heart Failure Sister    Congestive Heart Failure Brother    Congestive Heart Failure Daughter    Congestive Heart Failure Son    Breast cancer Maternal Aunt        great aunt     Current Outpatient Medications:    acetaminophen (TYLENOL) 325 MG tablet, Take 2 tablets (650 mg total) by mouth every 6 (six) hours as needed for mild pain or fever., Disp: 60 tablet, Rfl: 0   apixaban (ELIQUIS) 5 MG TABS tablet, Take 1 tablet (5 mg total) by mouth 2 (two) times daily., Disp: 60 tablet, Rfl: 0   calcium carbonate (OS-CAL - DOSED IN MG OF ELEMENTAL CALCIUM) 1250 (500  Ca) MG tablet, Take 2 tablets by mouth in the morning., Disp: , Rfl:    diphenhydrAMINE (BENADRYL) 25 mg capsule, Take 25 mg by mouth every 6 (six) hours  as needed for itching or allergies., Disp: , Rfl:    docusate sodium (COLACE) 100 MG capsule, Take 200 mg by mouth at bedtime as needed for mild constipation., Disp: , Rfl:    ferrous sulfate 325 (65 FE) MG tablet, Take 325 mg by mouth 2 (two) times daily with a meal., Disp: , Rfl:    gabapentin (NEURONTIN) 600 MG tablet, Take 1 tablet (600 mg total) by mouth 3 (three) times daily., Disp: 90 tablet, Rfl: 3   levocetirizine (XYZAL) 5 MG tablet, Take 5 mg by mouth every evening., Disp: , Rfl:    nortriptyline (PAMELOR) 50 MG capsule, Take 50 mg by mouth at bedtime., Disp: , Rfl:    nystatin (MYCOSTATIN) 100000 UNIT/ML suspension, Take 5 mLs by mouth 4 (four) times daily., Disp: , Rfl:    nystatin cream (MYCOSTATIN), Apply 1 application topically 2 (two) times daily. (Apply under skin folds), Disp: , Rfl:    ondansetron (ZOFRAN) 4 MG tablet, Take 1 tablet (4 mg total) by mouth every 6 (six) hours as needed for nausea., Disp: 20 tablet, Rfl: 0   oxyCODONE-acetaminophen (PERCOCET) 10-325 MG tablet, Take 1 tablet by mouth every 4 (four) hours as needed for pain., Disp: 150 tablet, Rfl: 0   [START ON 09/23/2021] oxyCODONE-acetaminophen (PERCOCET) 10-325 MG tablet, Take 1 tablet by mouth every 4 (four) hours as needed for pain., Disp: 150 tablet, Rfl: 0   pantoprazole (PROTONIX) 40 MG tablet, Take 40 mg by mouth daily., Disp: , Rfl:    polyethylene glycol (MIRALAX / GLYCOLAX) 17 g packet, Take 17 g by mouth daily as needed for moderate constipation., Disp: 14 each, Rfl: 0   potassium chloride SA (KLOR-CON M) 20 MEQ tablet, Take 20 mEq by mouth 2 (two) times daily., Disp: , Rfl:    predniSONE (DELTASONE) 10 MG tablet, Take 10 mg by mouth in the morning., Disp: , Rfl:    torsemide (DEMADEX) 10 MG tablet, Take 10 mg by mouth daily., Disp: , Rfl:     ipratropium-albuterol (DUONEB) 0.5-2.5 (3) MG/3ML SOLN, Take 3 mLs by nebulization every 6 (six) hours as needed (shortness of breath). (Patient not taking: Reported on 09/10/2021), Disp: , Rfl:   Physical exam:  Vitals:   09/10/21 1039  BP: (!) 80/67  Pulse: 72  Resp: 16  Temp: 98.4 F (36.9 C)  TempSrc: Oral  Weight: (!) 303 lb 9.6 oz (137.7 kg)  Height: '5\' 7"'$  (1.702 m)   Physical Exam Constitutional:      Appearance: She is obese.     Comments: Sitting in a wheelchair.  Appears in no acute distress  Cardiovascular:     Rate and Rhythm: Normal rate and regular rhythm.     Heart sounds: Normal heart sounds.  Pulmonary:     Effort: Pulmonary effort is normal.     Breath sounds: Normal breath sounds.  Abdominal:     General: Bowel sounds are normal.     Palpations: Abdomen is soft.     Comments: Central scar of abdominal surgery is healed well.  Colostomy in place and functioning well  Musculoskeletal:     Cervical back: Normal range of motion.  Skin:    General: Skin is warm and dry.  Neurological:     Mental Status: She is alert and oriented to person, place, and time.         Latest Ref Rng & Units 09/10/2021    9:39 AM  CMP  Glucose 70 - 99 mg/dL 95  BUN 8 - 23 mg/dL 22   Creatinine 0.44 - 1.00 mg/dL 1.37   Sodium 135 - 145 mmol/L 141   Potassium 3.5 - 5.1 mmol/L 4.1   Chloride 98 - 111 mmol/L 105   CO2 22 - 32 mmol/L 29   Calcium 8.9 - 10.3 mg/dL 8.7   Total Protein 6.5 - 8.1 g/dL 6.6   Total Bilirubin 0.3 - 1.2 mg/dL 0.2   Alkaline Phos 38 - 126 U/L 77   AST 15 - 41 U/L 23   ALT 0 - 44 U/L 13       Latest Ref Rng & Units 09/10/2021    9:39 AM  CBC  WBC 4.0 - 10.5 K/uL 3.2   Hemoglobin 12.0 - 15.0 g/dL 11.7   Hematocrit 36.0 - 46.0 % 40.7   Platelets 150 - 400 K/uL 340     No images are attached to the encounter.  No results found.   Assessment and plan- Patient is a 71 y.o. female who is here for follow-up of following  History of pulmonary  embolism: Continue Eliquis indefinitely as long as there are no ongoing bleeding issues  Normocytic anemia: Hemoglobin is improved from 9.3-11.7.  Ferritin levels are low at 28 with an iron saturation of 19%. we will reach out to the patient to see if she would be willing to take IV iron at this time.  Otherwise check CBC ferritin and iron studies in 3 in 6 months and I will see her back in 6 months   Visit Diagnosis 1. History of pulmonary embolism      Dr. Randa Evens, MD, MPH [facility] at [facility] [phone] 09/10/2021 1:27 PM

## 2021-10-22 ENCOUNTER — Ambulatory Visit: Payer: Medicare Other | Admitting: Anesthesiology

## 2021-10-23 ENCOUNTER — Encounter: Payer: Self-pay | Admitting: Anesthesiology

## 2021-10-23 ENCOUNTER — Ambulatory Visit: Payer: Medicare Other | Attending: Anesthesiology | Admitting: Anesthesiology

## 2021-10-23 DIAGNOSIS — M5136 Other intervertebral disc degeneration, lumbar region: Secondary | ICD-10-CM | POA: Diagnosis not present

## 2021-10-23 DIAGNOSIS — M5441 Lumbago with sciatica, right side: Secondary | ICD-10-CM

## 2021-10-23 DIAGNOSIS — G894 Chronic pain syndrome: Secondary | ICD-10-CM

## 2021-10-23 DIAGNOSIS — Z79891 Long term (current) use of opiate analgesic: Secondary | ICD-10-CM

## 2021-10-23 DIAGNOSIS — M316 Other giant cell arteritis: Secondary | ICD-10-CM

## 2021-10-23 DIAGNOSIS — M25551 Pain in right hip: Secondary | ICD-10-CM

## 2021-10-23 DIAGNOSIS — M1712 Unilateral primary osteoarthritis, left knee: Secondary | ICD-10-CM

## 2021-10-23 DIAGNOSIS — M25552 Pain in left hip: Secondary | ICD-10-CM

## 2021-10-23 DIAGNOSIS — G8929 Other chronic pain: Secondary | ICD-10-CM

## 2021-10-23 DIAGNOSIS — M5442 Lumbago with sciatica, left side: Secondary | ICD-10-CM | POA: Diagnosis not present

## 2021-10-23 DIAGNOSIS — I5032 Chronic diastolic (congestive) heart failure: Secondary | ICD-10-CM

## 2021-10-23 DIAGNOSIS — M48062 Spinal stenosis, lumbar region with neurogenic claudication: Secondary | ICD-10-CM

## 2021-10-23 DIAGNOSIS — F1190 Opioid use, unspecified, uncomplicated: Secondary | ICD-10-CM

## 2021-10-23 MED ORDER — OXYCODONE-ACETAMINOPHEN 10-325 MG PO TABS: 1.0000 | ORAL_TABLET | ORAL | 0 refills | Status: AC | PRN

## 2021-10-23 NOTE — Progress Notes (Signed)
Virtual Visit via Telephone Note  I connected with Wendy Sanchez on 10/23/21 at 11:40 AM EDT by telephone and verified that I am speaking with the correct person using two identifiers.  Location: Patient: Home Provider: Pain control center   I discussed the limitations, risks, security and privacy concerns of performing an evaluation and management service by telephone and the availability of in person appointments. I also discussed with the patient that there may be a patient responsible charge related to this service. The patient expressed understanding and agreed to proceed.   History of Present Illness: I spoke with Wendy Sanchez today via telephone as we are unable like for the video portion is complex.  She reports that she is doing reasonably well with the low back pain knee pain and lower leg pain.  She still taking her Percocet 10/15/2023 approximately every 4 hours for total of about 5 tablets/day on average.  This continues to work well for her.  She gets good relief rated about 50 to 75% where she has failed more conservative therapy.  No other changes reported in the quality characteristic or distribution of her pain today.  She still having some difficulties with a recent follow-up from surgery with a scheduled CT scan to reevaluate a recurrent abdominal mass followed by her surgery team.  Otherwise she is doing well with her congestive heart failure status and appears to be doing well with pain management as well.  No recent changes are noted.  Lower extremity strength and function are otherwise stable.  Review of systems: General: No fevers or chills Pulmonary: No shortness of breath or dyspnea Cardiac: No angina or palpitations or lightheadedness GI: No abdominal pain or constipation Psych: No depression    Observations/Objective:   Current Outpatient Medications:    [START ON 11/22/2021] oxyCODONE-acetaminophen (PERCOCET) 10-325 MG tablet, Take 1 tablet by mouth every 4  (four) hours as needed for pain., Disp: 150 tablet, Rfl: 0   acetaminophen (TYLENOL) 325 MG tablet, Take 2 tablets (650 mg total) by mouth every 6 (six) hours as needed for mild pain or fever., Disp: 60 tablet, Rfl: 0   apixaban (ELIQUIS) 5 MG TABS tablet, Take 1 tablet (5 mg total) by mouth 2 (two) times daily., Disp: 60 tablet, Rfl: 0   calcium carbonate (OS-CAL - DOSED IN MG OF ELEMENTAL CALCIUM) 1250 (500 Ca) MG tablet, Take 2 tablets by mouth in the morning., Disp: , Rfl:    diphenhydrAMINE (BENADRYL) 25 mg capsule, Take 25 mg by mouth every 6 (six) hours as needed for itching or allergies., Disp: , Rfl:    docusate sodium (COLACE) 100 MG capsule, Take 200 mg by mouth at bedtime as needed for mild constipation., Disp: , Rfl:    ferrous sulfate 325 (65 FE) MG tablet, Take 325 mg by mouth 2 (two) times daily with a meal., Disp: , Rfl:    gabapentin (NEURONTIN) 600 MG tablet, Take 1 tablet (600 mg total) by mouth 3 (three) times daily., Disp: 90 tablet, Rfl: 3   ipratropium-albuterol (DUONEB) 0.5-2.5 (3) MG/3ML SOLN, Take 3 mLs by nebulization every 6 (six) hours as needed (shortness of breath). (Patient not taking: Reported on 09/10/2021), Disp: , Rfl:    levocetirizine (XYZAL) 5 MG tablet, Take 5 mg by mouth every evening., Disp: , Rfl:    nortriptyline (PAMELOR) 50 MG capsule, Take 50 mg by mouth at bedtime., Disp: , Rfl:    nystatin (MYCOSTATIN) 100000 UNIT/ML suspension, Take 5 mLs by mouth 4 (  four) times daily., Disp: , Rfl:    nystatin cream (MYCOSTATIN), Apply 1 application topically 2 (two) times daily. (Apply under skin folds), Disp: , Rfl:    ondansetron (ZOFRAN) 4 MG tablet, Take 1 tablet (4 mg total) by mouth every 6 (six) hours as needed for nausea., Disp: 20 tablet, Rfl: 0   oxyCODONE-acetaminophen (PERCOCET) 10-325 MG tablet, Take 1 tablet by mouth every 4 (four) hours as needed for pain., Disp: 150 tablet, Rfl: 0   pantoprazole (PROTONIX) 40 MG tablet, Take 40 mg by mouth daily.,  Disp: , Rfl:    polyethylene glycol (MIRALAX / GLYCOLAX) 17 g packet, Take 17 g by mouth daily as needed for moderate constipation., Disp: 14 each, Rfl: 0   potassium chloride SA (KLOR-CON M) 20 MEQ tablet, Take 20 mEq by mouth 2 (two) times daily., Disp: , Rfl:    predniSONE (DELTASONE) 10 MG tablet, Take 10 mg by mouth in the morning., Disp: , Rfl:    torsemide (DEMADEX) 10 MG tablet, Take 10 mg by mouth daily., Disp: , Rfl:    Past Medical History:  Diagnosis Date   Allergic rhinitis    Arthritis    Asthma    problems with fumes and aerosols cause asthma   Chest pain 10/19/2016   CHF (congestive heart failure) (HCC)    Complication of anesthesia    awakens during surgery; has occurred with last 3-4 surgeries    COPD (chronic obstructive pulmonary disease) ([place])    DVT (deep venous thrombosis) (HCC) 07/13/2018   Right leg   Environmental allergies    fumes    Fibromyalgia    GERD (gastroesophageal reflux disease)    Headache    History of bronchitis    Hx of total knee arthroplasty 12/13/2015   Hyperlipidemia    Hypertension    Morbid obesity with BMI of 45.0-49.9, adult ([place]) 07/25/2015   NICM (nonischemic cardiomyopathy) ([place])    a. ? PVC mediated;  b. 06/2016 Echo: EF 25-30%, mild LVH, mild LAE, mild AI/MR/TR/PR; c. 08/2016 Cath: nl cors, EF 25%.   Numbness    hands bilat when driving; improves when not preforming task    Osteoarthritis of left knee 08/02/2015   PE (pulmonary thromboembolism) ([place]) 07/13/2018   Pes anserinus bursitis of left knee 05/18/2016   Presence of right artificial knee joint 05/18/2016   PVC (premature ventricular contraction) 06/04/2017   PVC's (premature ventricular contractions)    a. 11/2016 Amio started;  b. 11/29/16 48h Holter: 25427 PVC's (41%); c. 12/2016 24h Holter: 18040 PVC's (15%); c. 02/2017 48h Holter: 37262 PVC's (41%).   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 DDD (degenerative disc disease), lumbar   2. Chronic bilateral low back pain with bilateral sciatica   3. Chronic, continuous use of opioids   4. Chronic pain syndrome   5. Hip pain, bilateral   6. Spinal stenosis of lumbar region with neurogenic claudication   7. Chronic pain of right hip   8. Temporal arteritis ([place])   9. Primary osteoarthritis of left knee   10. Chronic diastolic CHF (congestive heart  failure) [facility])   Based on her current status and after review of the [facility] practitioner database information I think is appropriate to refill her medicines for the next 2 months.  This be dated for October 12 and November 11.  No other changes in her pharmacologic regimen will be initiated.  She continues to do well with her medication management with no side effects.  She was last seen back in June with a recent urine screen which was appropriate with no evidence of any diverting or illicit use.  Continue follow-up with her primary care physicians and general surgery team for its ongoing evaluation with return to clinic in 55-monthschedule. Follow Up Instructions:    I discussed the assessment and treatment plan with the patient. The patient was provided an opportunity to ask questions and all were answered. The patient agreed with the plan and demonstrated an understanding of the instructions.   The patient was advised to call back or seek an in-person evaluation if the symptoms worsen or if the condition fails to improve as anticipated.  I provided 30 minutes of non-face-to-face time during this encounter.   JMolli Barrows MD

## 2021-11-10 ENCOUNTER — Encounter (INDEPENDENT_AMBULATORY_CARE_PROVIDER_SITE_OTHER): Payer: Self-pay

## 2021-12-29 ENCOUNTER — Ambulatory Visit: Payer: Medicare Other | Attending: Anesthesiology | Admitting: Anesthesiology

## 2021-12-29 DIAGNOSIS — G8929 Other chronic pain: Secondary | ICD-10-CM

## 2021-12-29 DIAGNOSIS — M5441 Lumbago with sciatica, right side: Secondary | ICD-10-CM

## 2021-12-29 DIAGNOSIS — G894 Chronic pain syndrome: Secondary | ICD-10-CM

## 2021-12-29 DIAGNOSIS — M5442 Lumbago with sciatica, left side: Secondary | ICD-10-CM

## 2021-12-29 DIAGNOSIS — M25552 Pain in left hip: Secondary | ICD-10-CM

## 2021-12-29 DIAGNOSIS — M25551 Pain in right hip: Secondary | ICD-10-CM

## 2021-12-29 DIAGNOSIS — F1190 Opioid use, unspecified, uncomplicated: Secondary | ICD-10-CM

## 2021-12-29 DIAGNOSIS — M316 Other giant cell arteritis: Secondary | ICD-10-CM

## 2021-12-29 DIAGNOSIS — M5136 Other intervertebral disc degeneration, lumbar region: Secondary | ICD-10-CM

## 2021-12-29 DIAGNOSIS — M48062 Spinal stenosis, lumbar region with neurogenic claudication: Secondary | ICD-10-CM

## 2021-12-30 ENCOUNTER — Telehealth: Payer: Medicare Other | Admitting: Anesthesiology

## 2021-12-30 ENCOUNTER — Encounter: Payer: Self-pay | Admitting: Anesthesiology

## 2021-12-30 ENCOUNTER — Ambulatory Visit: Payer: Medicare Other | Attending: Anesthesiology | Admitting: Anesthesiology

## 2021-12-30 DIAGNOSIS — M5441 Lumbago with sciatica, right side: Secondary | ICD-10-CM

## 2021-12-30 DIAGNOSIS — M25552 Pain in left hip: Secondary | ICD-10-CM

## 2021-12-30 DIAGNOSIS — M5442 Lumbago with sciatica, left side: Secondary | ICD-10-CM

## 2021-12-30 DIAGNOSIS — M25551 Pain in right hip: Secondary | ICD-10-CM

## 2021-12-30 DIAGNOSIS — M48062 Spinal stenosis, lumbar region with neurogenic claudication: Secondary | ICD-10-CM

## 2021-12-30 DIAGNOSIS — M316 Other giant cell arteritis: Secondary | ICD-10-CM

## 2021-12-30 DIAGNOSIS — G8929 Other chronic pain: Secondary | ICD-10-CM

## 2021-12-30 DIAGNOSIS — M51369 Other intervertebral disc degeneration, lumbar region without mention of lumbar back pain or lower extremity pain: Secondary | ICD-10-CM

## 2021-12-30 DIAGNOSIS — M1712 Unilateral primary osteoarthritis, left knee: Secondary | ICD-10-CM

## 2021-12-30 DIAGNOSIS — Z79891 Long term (current) use of opiate analgesic: Secondary | ICD-10-CM

## 2021-12-30 DIAGNOSIS — G894 Chronic pain syndrome: Secondary | ICD-10-CM

## 2021-12-30 DIAGNOSIS — F1190 Opioid use, unspecified, uncomplicated: Secondary | ICD-10-CM

## 2021-12-30 DIAGNOSIS — I5032 Chronic diastolic (congestive) heart failure: Secondary | ICD-10-CM

## 2021-12-30 DIAGNOSIS — M5136 Other intervertebral disc degeneration, lumbar region: Secondary | ICD-10-CM

## 2021-12-30 MED ORDER — OXYCODONE-ACETAMINOPHEN 10-325 MG PO TABS: 1.0000 | ORAL_TABLET | ORAL | 0 refills | Status: AC | PRN

## 2021-12-30 MED ORDER — CYCLOBENZAPRINE HCL 10 MG PO TABS: 10.0000 mg | ORAL_TABLET | Freq: Every day | ORAL | 5 refills | Status: AC

## 2021-12-30 MED ORDER — GABAPENTIN 600 MG PO TABS: 600.0000 mg | ORAL_TABLET | Freq: Three times a day (TID) | ORAL | 3 refills | Status: DC

## 2021-12-30 MED ORDER — OXYCODONE-ACETAMINOPHEN 10-325 MG PO TABS: 1.0000 | ORAL_TABLET | ORAL | 0 refills | Status: DC | PRN

## 2021-12-30 NOTE — Progress Notes (Signed)
Virtual Visit via Video Note  I connected with [name] on 12/30/21 at  2:45 PM EST by a video enabled telemedicine application and verified that I am speaking with the correct person using two identifiers.  Location: Patient: Home Provider: Pain control center   I discussed the limitations of evaluation and management by telemedicine and the availability of in person appointments. The patient expressed understanding and agreed to proceed.  History of Present Illness: I spoke with Wendy Sanchez via telephone as we are unable link for the video portion of the conference.  She reports that her low back pain has been considerable especially after running out of her medicine recently.  She has been taking oxycodone tablets approximately every 4-6 hours but secondary to unforeseen circumstances we were not able to link up for her most recent conference and she has run out of medicine and been out of medicine for 6 days.  The pain is similar to what she is experienced in the past is more intense.  She has not been able to sleep and fortunately has not experienced any withdrawal side effects.  She has been using anti-inflammatories but the pain has been quite considerable.  It is in the usual spots mainly her low back and knees back of the legs going into her feet.  She is not sleeping well at night.  No change in lower extremity strength function bowel or bladder function is noted.  Review of systems: General: No fevers or chills Pulmonary: No shortness of breath or dyspnea Cardiac: No angina or palpitations or lightheadedness GI: No abdominal pain or constipation Psych: No depression    Observations/Objective:   Current Outpatient Medications:    cyclobenzaprine (FLEXERIL) 10 MG tablet, Take 1 tablet (10 mg total) by mouth at bedtime., Disp: 30 tablet, Rfl: 5   oxyCODONE-acetaminophen (PERCOCET) 10-325 MG tablet, Take 1 tablet by mouth every 4 (four) hours as needed for pain., Disp: 160  tablet, Rfl: 0   [START ON 01/29/2022] oxyCODONE-acetaminophen (PERCOCET) 10-325 MG tablet, Take 1 tablet by mouth every 4 (four) hours as needed for pain., Disp: 150 tablet, Rfl: 0   acetaminophen (TYLENOL) 325 MG tablet, Take 2 tablets (650 mg total) by mouth every 6 (six) hours as needed for mild pain or fever., Disp: 60 tablet, Rfl: 0   apixaban (ELIQUIS) 5 MG TABS tablet, Take 1 tablet (5 mg total) by mouth 2 (two) times daily., Disp: 60 tablet, Rfl: 0   calcium carbonate (OS-CAL - DOSED IN MG OF ELEMENTAL CALCIUM) 1250 (500 Ca) MG tablet, Take 2 tablets by mouth in the morning., Disp: , Rfl:    diphenhydrAMINE (BENADRYL) 25 mg capsule, Take 25 mg by mouth every 6 (six) hours as needed for itching or allergies., Disp: , Rfl:    docusate sodium (COLACE) 100 MG capsule, Take 200 mg by mouth at bedtime as needed for mild constipation., Disp: , Rfl:    ferrous sulfate 325 (65 FE) MG tablet, Take 325 mg by mouth 2 (two) times daily with a meal., Disp: , Rfl:    gabapentin (NEURONTIN) 600 MG tablet, Take 1 tablet (600 mg total) by mouth 3 (three) times daily., Disp: 90 tablet, Rfl: 3   ipratropium-albuterol (DUONEB) 0.5-2.5 (3) MG/3ML SOLN, Take 3 mLs by nebulization every 6 (six) hours as needed (shortness of breath). (Patient not taking: Reported on 09/10/2021), Disp: , Rfl:    levocetirizine (XYZAL) 5 MG tablet, Take 5 mg by mouth every evening., Disp: , Rfl:  nortriptyline (PAMELOR) 50 MG capsule, Take 50 mg by mouth at bedtime., Disp: , Rfl:    nystatin (MYCOSTATIN) 100000 UNIT/ML suspension, Take 5 mLs by mouth 4 (four) times daily., Disp: , Rfl:    nystatin cream (MYCOSTATIN), Apply 1 application topically 2 (two) times daily. (Apply under skin folds), Disp: , Rfl:    ondansetron (ZOFRAN) 4 MG tablet, Take 1 tablet (4 mg total) by mouth every 6 (six) hours as needed for nausea., Disp: 20 tablet, Rfl: 0   pantoprazole (PROTONIX) 40 MG tablet, Take 40 mg by mouth daily., Disp: , Rfl:     polyethylene glycol (MIRALAX / GLYCOLAX) 17 g packet, Take 17 g by mouth daily as needed for moderate constipation., Disp: 14 each, Rfl: 0   potassium chloride SA (KLOR-CON M) 20 MEQ tablet, Take 20 mEq by mouth 2 (two) times daily., Disp: , Rfl:    predniSONE (DELTASONE) 10 MG tablet, Take 10 mg by mouth in the morning., Disp: , Rfl:    torsemide (DEMADEX) 10 MG tablet, Take 10 mg by mouth daily., Disp: , Rfl:    Past Medical History:  Diagnosis Date   Allergic rhinitis    Arthritis    Asthma    problems with fumes and aerosols cause asthma   Chest pain 10/19/2016   CHF (congestive heart failure) (HCC)    Complication of anesthesia    awakens during surgery; has occurred with last 3-4 surgeries    COPD (chronic obstructive pulmonary disease) ([place])    DVT (deep venous thrombosis) (HCC) 07/13/2018   Right leg   Environmental allergies    fumes    Fibromyalgia    GERD (gastroesophageal reflux disease)    Headache    History of bronchitis    Hx of total knee arthroplasty 12/13/2015   Hyperlipidemia    Hypertension    Morbid obesity with BMI of 45.0-49.9, adult ([place]) 07/25/2015   NICM (nonischemic cardiomyopathy) ([place])    a. ? PVC mediated;  b. 06/2016 Echo: EF 25-30%, mild LVH, mild LAE, mild AI/MR/TR/PR; c. 08/2016 Cath: nl cors, EF 25%.   Numbness    hands bilat when driving; improves when not preforming task    Osteoarthritis of left knee 08/02/2015   PE (pulmonary thromboembolism) ([place]) 07/13/2018   Pes anserinus bursitis of left knee 05/18/2016   Presence of right artificial knee joint 05/18/2016   PVC (premature ventricular contraction) 06/04/2017   PVC's (premature ventricular contractions)    a. 11/2016 Amio started;  b. 11/29/16 48h Holter: 50932 PVC's (41%); c. 12/2016 24h Holter: 18040 PVC's (15%); c. 02/2017 48h Holter: 37262 PVC's (41%).   Sleep apnea    cannot tolerate CPAP   Spinal stenosis of lumbar region 06/19/2015   Status post gastric banding    Status post  total left knee replacement 08/02/2015   Temporal arteritis ([place]) 05/01/2020   Tinnitus    comes and goes    Unilateral primary osteoarthritis, right knee 12/13/2015   Vertigo    none for over 2 yrs    Assessment and Plan: 1. DDD (degenerative disc disease), lumbar   2. Chronic bilateral low back pain with bilateral sciatica   3. Chronic, continuous use of opioids   4. Chronic pain syndrome   5. Hip pain, bilateral   6. Spinal stenosis of lumbar region with neurogenic claudication   7. Chronic pain of right hip   8. Temporal arteritis ([place])   9. Primary osteoarthritis of left knee   10. Chronic diastolic  CHF (congestive heart failure) ([place])   Patient our conversation today I think it is appropriate to refill her medicines.  This will be dated for today and 1 month from today.  Will schedule her for return to clinic in 2 months.  No other changes in her pharmacologic regimen will be initiated.  I have reviewed the [facility] practitioner database information and it is appropriate for refill.  Continue follow-up with her primary care physicians for baseline medical care with return to clinic in 2 months.  Follow Up Instructions:    I discussed the assessment and treatment plan with the patient. The patient was provided an opportunity to ask questions and all were answered. The patient agreed with the plan and demonstrated an understanding of the instructions.   The patient was advised to call back or seek an in-person evaluation if the symptoms worsen or if the condition fails to improve as anticipated.  I provided 25 minutes of non-face-to-face time during this encounter.   Molli Barrows, MD

## 2021-12-31 ENCOUNTER — Encounter: Payer: Self-pay | Admitting: Anesthesiology

## 2021-12-31 NOTE — Progress Notes (Signed)
Despite multiple attempts to reach [place] via telephone, I was unable to do this.  I left a message on recorder to try and touch base with [place] tomorrow.

## 2022-02-19 ENCOUNTER — Ambulatory Visit: Payer: Medicare Other | Attending: Anesthesiology | Admitting: Anesthesiology

## 2022-02-19 ENCOUNTER — Encounter: Payer: Self-pay | Admitting: Anesthesiology

## 2022-02-19 DIAGNOSIS — M19011 Primary osteoarthritis, right shoulder: Secondary | ICD-10-CM

## 2022-02-19 DIAGNOSIS — M5136 Other intervertebral disc degeneration, lumbar region: Secondary | ICD-10-CM | POA: Diagnosis not present

## 2022-02-19 DIAGNOSIS — M316 Other giant cell arteritis: Secondary | ICD-10-CM

## 2022-02-19 DIAGNOSIS — F1190 Opioid use, unspecified, uncomplicated: Secondary | ICD-10-CM

## 2022-02-19 DIAGNOSIS — M25552 Pain in left hip: Secondary | ICD-10-CM

## 2022-02-19 DIAGNOSIS — M25511 Pain in right shoulder: Secondary | ICD-10-CM

## 2022-02-19 DIAGNOSIS — M48062 Spinal stenosis, lumbar region with neurogenic claudication: Secondary | ICD-10-CM

## 2022-02-19 DIAGNOSIS — G8929 Other chronic pain: Secondary | ICD-10-CM

## 2022-02-19 DIAGNOSIS — G894 Chronic pain syndrome: Secondary | ICD-10-CM

## 2022-02-19 DIAGNOSIS — M1712 Unilateral primary osteoarthritis, left knee: Secondary | ICD-10-CM

## 2022-02-19 MED ORDER — OXYCODONE-ACETAMINOPHEN 10-325 MG PO TABS: 1.0000 | ORAL_TABLET | ORAL | 0 refills | Status: DC | PRN

## 2022-02-19 MED ORDER — OXYCODONE-ACETAMINOPHEN 10-325 MG PO TABS: 1.0000 | ORAL_TABLET | ORAL | 0 refills | Status: AC | PRN

## 2022-02-19 NOTE — Progress Notes (Signed)
Virtual Visit via Telephone Note  I connected with Wendy Sanchez on 02/19/22 at  1:50 PM EST by telephone and verified that I am speaking with the correct person using two identifiers.  Location: Patient: Home Provider: Pain control center   I discussed the limitations, risks, security and privacy concerns of performing an evaluation and management service by telephone and the availability of in person appointments. I also discussed with the patient that there may be a patient responsible charge related to this service. The patient expressed understanding and agreed to proceed.   History of Present Illness: I spoke with Wendy Sanchez via telephone this afternoon as we were unable to link for the video portion of the conference.  She reports that she is still having a considerable amount of low back pain hip pain and leg pain comparable to what she is experienced in the past.  No significant change in quality characteristic or distribution is noted.  She has been able to wean down her dose of oral steroids secondary to the baseline inflammatory condition.  She is also been able to wean down her antibiotics as well.  She still takes her gabapentin as prescribed and this continues to work well for her.  No change in lower extremity strength function bowel or bladder function is noted.  When she takes her medication she gets about a 60 to 70% reduction in pain lasting about 4 to 6 hours before she has recurrence of a similar pain.  Without the medication she maintains that she would be recumbent and unable to function.  Review of systems: General: No fevers or chills Pulmonary: No shortness of breath or dyspnea Cardiac: No angina or palpitations or lightheadedness GI: No abdominal pain or constipation Psych: No depression    Observations/Objective:  Current Outpatient Medications:    [START ON 03/30/2022] oxyCODONE-acetaminophen (PERCOCET) 10-325 MG tablet, Take 1 tablet by mouth every 4 (four)  hours as needed for pain., Disp: 150 tablet, Rfl: 0   acetaminophen (TYLENOL) 325 MG tablet, Take 2 tablets (650 mg total) by mouth every 6 (six) hours as needed for mild pain or fever., Disp: 60 tablet, Rfl: 0   apixaban (ELIQUIS) 5 MG TABS tablet, Take 1 tablet (5 mg total) by mouth 2 (two) times daily., Disp: 60 tablet, Rfl: 0   calcium carbonate (OS-CAL - DOSED IN MG OF ELEMENTAL CALCIUM) 1250 (500 Ca) MG tablet, Take 2 tablets by mouth in the morning., Disp: , Rfl:    cyclobenzaprine (FLEXERIL) 10 MG tablet, Take 1 tablet (10 mg total) by mouth at bedtime., Disp: 30 tablet, Rfl: 5   diphenhydrAMINE (BENADRYL) 25 mg capsule, Take 25 mg by mouth every 6 (six) hours as needed for itching or allergies., Disp: , Rfl:    docusate sodium (COLACE) 100 MG capsule, Take 200 mg by mouth at bedtime as needed for mild constipation., Disp: , Rfl:    ferrous sulfate 325 (65 FE) MG tablet, Take 325 mg by mouth 2 (two) times daily with a meal., Disp: , Rfl:    gabapentin (NEURONTIN) 600 MG tablet, Take 1 tablet (600 mg total) by mouth 3 (three) times daily., Disp: 90 tablet, Rfl: 3   ipratropium-albuterol (DUONEB) 0.5-2.5 (3) MG/3ML SOLN, Take 3 mLs by nebulization every 6 (six) hours as needed (shortness of breath). (Patient not taking: Reported on 09/10/2021), Disp: , Rfl:    levocetirizine (XYZAL) 5 MG tablet, Take 5 mg by mouth every evening., Disp: , Rfl:    nortriptyline (PAMELOR)  50 MG capsule, Take 50 mg by mouth at bedtime., Disp: , Rfl:    nystatin (MYCOSTATIN) 100000 UNIT/ML suspension, Take 5 mLs by mouth 4 (four) times daily., Disp: , Rfl:    nystatin cream (MYCOSTATIN), Apply 1 application topically 2 (two) times daily. (Apply under skin folds), Disp: , Rfl:    ondansetron (ZOFRAN) 4 MG tablet, Take 1 tablet (4 mg total) by mouth every 6 (six) hours as needed for nausea., Disp: 20 tablet, Rfl: 0   [START ON 02/28/2022] oxyCODONE-acetaminophen (PERCOCET) 10-325 MG tablet, Take 1 tablet by mouth every 4  (four) hours as needed for pain., Disp: 150 tablet, Rfl: 0   pantoprazole (PROTONIX) 40 MG tablet, Take 40 mg by mouth daily., Disp: , Rfl:    polyethylene glycol (MIRALAX / GLYCOLAX) 17 g packet, Take 17 g by mouth daily as needed for moderate constipation., Disp: 14 each, Rfl: 0   potassium chloride SA (KLOR-CON M) 20 MEQ tablet, Take 20 mEq by mouth 2 (two) times daily., Disp: , Rfl:    predniSONE (DELTASONE) 10 MG tablet, Take 10 mg by mouth in the morning., Disp: , Rfl:    torsemide (DEMADEX) 10 MG tablet, Take 10 mg by mouth daily., Disp: , Rfl:    Past Medical History:  Diagnosis Date   Allergic rhinitis    Arthritis    Asthma    problems with fumes and aerosols cause asthma   Chest pain 10/19/2016   CHF (congestive heart failure) (HCC)    Complication of anesthesia    awakens during surgery; has occurred with last 3-4 surgeries    COPD (chronic obstructive pulmonary disease) ([place])    DVT (deep venous thrombosis) (HCC) 07/13/2018   Right leg   Environmental allergies    fumes    Fibromyalgia    GERD (gastroesophageal reflux disease)    Headache    History of bronchitis    Hx of total knee arthroplasty 12/13/2015   Hyperlipidemia    Hypertension    Morbid obesity with BMI of 45.0-49.9, adult ([place]) 07/25/2015   NICM (nonischemic cardiomyopathy) ([place])    a. ? PVC mediated;  b. 06/2016 Echo: EF 25-30%, mild LVH, mild LAE, mild AI/MR/TR/PR; c. 08/2016 Cath: nl cors, EF 25%.   Numbness    hands bilat when driving; improves when not preforming task    Osteoarthritis of left knee 08/02/2015   PE (pulmonary thromboembolism) ([place]) 07/13/2018   Pes anserinus bursitis of left knee 05/18/2016   Presence of right artificial knee joint 05/18/2016   PVC (premature ventricular contraction) 06/04/2017   PVC's (premature ventricular contractions)    a. 11/2016 Amio started;  b. 11/29/16 48h Holter: 30076 PVC's (41%); c. 12/2016 24h Holter: 18040 PVC's (15%); c. 02/2017 48h Holter: 37262  PVC's (41%).   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 DDD (degenerative disc disease), lumbar   2. Chronic bilateral low back pain with bilateral sciatica   3. Chronic, continuous use of opioids   4. Chronic pain syndrome   5. Hip pain, bilateral   6. Spinal stenosis of lumbar region with neurogenic claudication  7. Chronic pain of right hip   8. Temporal arteritis ([place])   9. Pain in joint of right shoulder   10. Primary osteoarthritis of right shoulder   11. Osteoarthritis of left knee, unspecified osteoarthritis type   Based on our discussion it is appropriate to refill her medicines for the next 2 months.  These will be dated for February 17 and March 18.  No other changes in her pharmacologic regimen will be initiated.  I want her to continue efforts at weight loss stretching and walking as best she can.  No other interventional therapies are indicated.  She is responded favorably to chronic opioid therapy and they have enabled her to stay active and functional.  Continue follow-up with her primary care physicians with a 85-monthreturn to clinic.  Follow Up Instructions:    I discussed the assessment and treatment plan with the patient. The patient was provided an opportunity to ask questions and all were answered. The patient agreed with the plan and demonstrated an understanding of the instructions.   The patient was advised to call back or seek an in-person evaluation if the symptoms worsen or if the condition fails to improve as anticipated.  I provided 30 minutes of non-face-to-face time during this encounter.   JMolli Barrows MD

## 2022-03-17 ENCOUNTER — Inpatient Hospital Stay: Payer: Medicare Other | Attending: Oncology | Admitting: Oncology

## 2022-03-17 ENCOUNTER — Encounter: Payer: Self-pay | Admitting: Oncology

## 2022-03-17 ENCOUNTER — Inpatient Hospital Stay: Payer: Medicare Other

## 2022-03-17 VITALS — BP 105/76 | HR 96 | Temp 98.4°F | Resp 16 | Ht 67.0 in | Wt 306.0 lb

## 2022-03-17 DIAGNOSIS — Z888 Allergy status to other drugs, medicaments and biological substances status: Secondary | ICD-10-CM | POA: Insufficient documentation

## 2022-03-17 DIAGNOSIS — I509 Heart failure, unspecified: Secondary | ICD-10-CM | POA: Diagnosis not present

## 2022-03-17 DIAGNOSIS — Z86711 Personal history of pulmonary embolism: Secondary | ICD-10-CM | POA: Diagnosis not present

## 2022-03-17 DIAGNOSIS — Z9884 Bariatric surgery status: Secondary | ICD-10-CM | POA: Insufficient documentation

## 2022-03-17 DIAGNOSIS — Z90710 Acquired absence of both cervix and uterus: Secondary | ICD-10-CM | POA: Diagnosis not present

## 2022-03-17 DIAGNOSIS — Z86718 Personal history of other venous thrombosis and embolism: Secondary | ICD-10-CM | POA: Diagnosis not present

## 2022-03-17 DIAGNOSIS — J4489 Other specified chronic obstructive pulmonary disease: Secondary | ICD-10-CM | POA: Insufficient documentation

## 2022-03-17 DIAGNOSIS — Z818 Family history of other mental and behavioral disorders: Secondary | ICD-10-CM | POA: Diagnosis not present

## 2022-03-17 DIAGNOSIS — Z833 Family history of diabetes mellitus: Secondary | ICD-10-CM | POA: Diagnosis not present

## 2022-03-17 DIAGNOSIS — Z885 Allergy status to narcotic agent status: Secondary | ICD-10-CM | POA: Diagnosis not present

## 2022-03-17 DIAGNOSIS — Z79899 Other long term (current) drug therapy: Secondary | ICD-10-CM | POA: Diagnosis not present

## 2022-03-17 DIAGNOSIS — Z91041 Radiographic dye allergy status: Secondary | ICD-10-CM | POA: Insufficient documentation

## 2022-03-17 DIAGNOSIS — D649 Anemia, unspecified: Secondary | ICD-10-CM | POA: Diagnosis not present

## 2022-03-17 DIAGNOSIS — R5383 Other fatigue: Secondary | ICD-10-CM | POA: Insufficient documentation

## 2022-03-17 DIAGNOSIS — Z933 Colostomy status: Secondary | ICD-10-CM | POA: Insufficient documentation

## 2022-03-17 DIAGNOSIS — Z9049 Acquired absence of other specified parts of digestive tract: Secondary | ICD-10-CM | POA: Insufficient documentation

## 2022-03-17 DIAGNOSIS — Z7901 Long term (current) use of anticoagulants: Secondary | ICD-10-CM | POA: Diagnosis not present

## 2022-03-17 DIAGNOSIS — Z803 Family history of malignant neoplasm of breast: Secondary | ICD-10-CM | POA: Diagnosis not present

## 2022-03-17 DIAGNOSIS — Z8249 Family history of ischemic heart disease and other diseases of the circulatory system: Secondary | ICD-10-CM | POA: Insufficient documentation

## 2022-03-17 DIAGNOSIS — D509 Iron deficiency anemia, unspecified: Secondary | ICD-10-CM | POA: Diagnosis present

## 2022-03-17 DIAGNOSIS — I428 Other cardiomyopathies: Secondary | ICD-10-CM | POA: Insufficient documentation

## 2022-03-17 DIAGNOSIS — I110 Hypertensive heart disease with heart failure: Secondary | ICD-10-CM | POA: Diagnosis not present

## 2022-03-17 DIAGNOSIS — Z96653 Presence of artificial knee joint, bilateral: Secondary | ICD-10-CM | POA: Diagnosis not present

## 2022-03-17 DIAGNOSIS — I739 Peripheral vascular disease, unspecified: Secondary | ICD-10-CM | POA: Diagnosis not present

## 2022-03-17 LAB — FERRITIN: Ferritin: 82 ng/mL (ref 11–307)

## 2022-03-17 LAB — CBC WITH DIFFERENTIAL/PLATELET
Abs Immature Granulocytes: 0.04 10*3/uL (ref 0.00–0.07)
Basophils Absolute: 0 10*3/uL (ref 0.0–0.1)
Basophils Relative: 0 %
Eosinophils Absolute: 0.3 10*3/uL (ref 0.0–0.5)
Eosinophils Relative: 4 %
HCT: 37.5 % (ref 36.0–46.0)
Hemoglobin: 11.4 g/dL — ABNORMAL LOW (ref 12.0–15.0)
Immature Granulocytes: 1 %
Lymphocytes Relative: 12 %
Lymphs Abs: 0.8 10*3/uL (ref 0.7–4.0)
MCH: 27.9 pg (ref 26.0–34.0)
MCHC: 30.4 g/dL (ref 30.0–36.0)
MCV: 91.7 fL (ref 80.0–100.0)
Monocytes Absolute: 0.3 10*3/uL (ref 0.1–1.0)
Monocytes Relative: 5 %
Neutro Abs: 5.2 10*3/uL (ref 1.7–7.7)
Neutrophils Relative %: 78 %
Platelets: 365 10*3/uL (ref 150–400)
RBC: 4.09 MIL/uL (ref 3.87–5.11)
RDW: 14 % (ref 11.5–15.5)
WBC: 6.7 10*3/uL (ref 4.0–10.5)
nRBC: 0 % (ref 0.0–0.2)

## 2022-03-17 LAB — IRON AND TIBC
Iron: 47 ug/dL (ref 28–170)
Saturation Ratios: 22 % (ref 10.4–31.8)
TIBC: 216 ug/dL — ABNORMAL LOW (ref 250–450)
UIBC: 169 ug/dL

## 2022-03-17 LAB — VITAMIN B12: Vitamin B-12: 248 pg/mL (ref 180–914)

## 2022-03-17 LAB — FOLATE: Folate: >40 ng/mL (ref >5.9)

## 2022-03-17 NOTE — Progress Notes (Signed)
Pt has been in and out of hospital several time in 1 year. She has been on several atb and  having thrush

## 2022-03-17 NOTE — Progress Notes (Signed)
Hematology/Oncology Consult note [facility]  [phone] [phone]  Patient Care Team: Crissie Figures, Hershal Coria as PCP - General (Physician Assistant) Deboraha Sprang, MD as PCP - Electrophysiology (Cardiology) Yolonda Kida, MD as PCP - Cardiology (Cardiology)   Name of the patient: Wendy Sanchez  [id number]  1950/01/30   Date of visit: 03/17/22  Diagnosis- history of pulmonary embolism on Eliquis Normocytic anemia likely secondary to chronic disease and a component of iron deficiency  Chief complaint/ Reason for visit-routine follow-up of pulmonary embolism on Eliquis  Heme/Onc history: Patient is a 72 year old female with a past medical history significant for COPD nonischemic cardiomyopathy with an EF of 50% peripheral vascular disease and a former smoker.  She has a history of DVT and PE for which she has been on Eliquis for a while.  She recently had perforated sigmoid diverticulitis in September 2022 s/p sigmoidectomy and colostomy placement at [facility].  Recently admitted to [facility] for septic shock   Her PE was diagnosed in 2020 following prolonged immobilization.  CT angio chest in July 2020 was positive for acute PE with CT evidence of right heart strain and probable pulmonary arterial hypertension at least consistent with submassive PE.  She had undergone a pulmonary thrombectomy at that time. Bilateral lower extremity ultrasound was positive for acute DVT involving the right popliteal vein.  Recent echocardiogram in October 2022 showed an EF of 60 to 65% with a normal left ventricular function and normal right ventricular systolic function and normal pulmonary arterial systolic pressure.   Patient states that although her Eliquis was stopped in the hospital it was restarted upon discharge and she is continuing to take it without any significant side effects.  Given obesity and limited mobility plan was to continue Eliquis indefinitely.      Interval history-patient is tolerating Eliquis without any significant side effects.  She has not had any recent falls.  Denies any blood loss in her stool or urine.  ECOG PS- 3 Pain scale-3  Review of systems- Review of Systems  Constitutional:  Positive for malaise/fatigue. Negative for chills, fever and weight loss.  HENT:  Negative for congestion, ear discharge and nosebleeds.   Eyes:  Negative for blurred vision.  Respiratory:  Negative for cough, hemoptysis, sputum production, shortness of breath and wheezing.   Cardiovascular:  Negative for chest pain, palpitations, orthopnea and claudication.  Gastrointestinal:  Negative for abdominal pain, blood in stool, constipation, diarrhea, heartburn, melena, nausea and vomiting.  Genitourinary:  Negative for dysuria, flank pain, frequency, hematuria and urgency.  Musculoskeletal:  Negative for back pain, joint pain and myalgias.  Skin:  Negative for rash.  Neurological:  Negative for dizziness, tingling, focal weakness, seizures, weakness and headaches.  Endo/Heme/Allergies:  Does not bruise/bleed easily.  Psychiatric/Behavioral:  Negative for depression and suicidal ideas. The patient does not have insomnia.       Allergies  Allergen Reactions   Hydrocodone-Acetaminophen Swelling    hands   Iodine Anaphylaxis and Swelling    Iv dye 02/03/21-Ispoke to patient and she had IV contrast  X4 last year and has had no problem. ( Last CT abdomen with contrast was on 01/02/21.-    Other Other (See Comments)    ALLERGY TO METAL - BLACKENS SKIN AND CAUSES A RASH    Tape Swelling and Other (See Comments)    Skin comes off.  Paper tape is ok tegaderm OK   Peanut Oil Other (See Comments)    **  Nuts cause runny nose   Shellfish Allergy Nausea And Vomiting and Swelling    Episode of GI infection after eating clam chowder. Still eats shrimp and other seafood     Past Medical History:  Diagnosis Date   Allergic rhinitis    Arthritis     Asthma    problems with fumes and aerosols cause asthma   Chest pain 10/19/2016   CHF (congestive heart failure) (HCC)    Complication of anesthesia    awakens during surgery; has occurred with last 3-4 surgeries    COPD (chronic obstructive pulmonary disease) ([place])    DVT (deep venous thrombosis) (HCC) 07/13/2018   Right leg   Environmental allergies    fumes    Fibromyalgia    GERD (gastroesophageal reflux disease)    Headache    History of bronchitis    Hx of total knee arthroplasty 12/13/2015   Hyperlipidemia    Hypertension    Morbid obesity with BMI of 45.0-49.9, adult ([place]) 07/25/2015   NICM (nonischemic cardiomyopathy) ([place])    a. ? PVC mediated;  b. 06/2016 Echo: EF 25-30%, mild LVH, mild LAE, mild AI/MR/TR/PR; c. 08/2016 Cath: nl cors, EF 25%.   Numbness    hands bilat when driving; improves when not preforming task    Osteoarthritis of left knee 08/02/2015   PE (pulmonary thromboembolism) ([place]) 07/13/2018   Pes anserinus bursitis of left knee 05/18/2016   Presence of right artificial knee joint 05/18/2016   PVC (premature ventricular contraction) 06/04/2017   PVC's (premature ventricular contractions)    a. 11/2016 Amio started;  b. 11/29/16 48h Holter: [id number] PVC's (41%); c. 12/2016 24h Holter: 18040 PVC's (15%); c. 02/2017 48h Holter: 37262 PVC's (41%).   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   Temporal arteritis ([place]) 05/01/2020   Tinnitus    comes and goes    Unilateral primary osteoarthritis, right knee 12/13/2015   Vertigo    none for over 2 yrs     Past Surgical History:  Procedure Laterality Date   ABDOMINAL HYSTERECTOMY  1979   left ovary remains   ABDOMINAL HYSTERECTOMY     ARTERY BIOPSY Left 06/07/2019   Procedure: BIOPSY TEMPORAL ARTERY;  Surgeon: Algernon Huxley, MD;  Location: [facility] ORS;  Service: Vascular;  Laterality:  Left;   CHOLECYSTECTOMY     COLONOSCOPY     COLONOSCOPY N/A 10/30/2019   Procedure: COLONOSCOPY;  Surgeon: Lesly Rubenstein, MD;  Location: [facility] ENDOSCOPY;  Service: Endoscopy;  Laterality: N/A;   ESOPHAGOGASTRODUODENOSCOPY N/A 10/30/2019   Procedure: ESOPHAGOGASTRODUODENOSCOPY (EGD);  Surgeon: Lesly Rubenstein, MD;  Location: [facility] ENDOSCOPY;  Service: Endoscopy;  Laterality: N/A;   fibrous tissue removed from right shoulder and back of neck      2 years ago    [place] N/A 02/28/2019   Procedure: SALIVARY GLAND BIOPSY;  Surgeon: Carloyn Manner, MD;  Location: [facility] ORS;  Service: ENT;  Laterality: N/A;   FRACTURE SURGERY     left small finger   GANGLION CYST EXCISION     [place]   states had bypass with banding and band left in -   GASTRIC BYPASS OPEN     JOINT REPLACEMENT Bilateral 2017   knees   KNEE SURGERY Bilateral    both knees  2001   LAPAROSCOPIC SIGMOID COLECTOMY  Left 09/13/2020   at [facility] for a perforated sigmoid diverticulitis   NECK SURGERY N/A 1205/19   ganglion cyst removal, [place]    PARTIAL COLECTOMY  09/2020   PULMONARY THROMBECTOMY Bilateral 07/14/2018   Procedure: PULMONARY THROMBECTOMY;  Surgeon: Algernon Huxley, MD;  Location: [place] CV LAB;  Service: Cardiovascular;  Laterality: Bilateral;   PVC ABLATION N/A 06/04/2017   Procedure: PVC ABLATION;  Surgeon: Evans Lance, MD;  Location: [place] CV LAB;  Service: Cardiovascular;  Laterality: N/A;   RIGHT/LEFT HEART CATH AND CORONARY ANGIOGRAPHY Bilateral 08/12/2016   Procedure: Right/Left Heart Cath and Coronary Angiography;  Surgeon: Yolonda Kida, MD;  Location: [place] [facility];  Service: Cardiovascular;  Laterality: Bilateral;   TONSILLECTOMY     age 54   TOTAL KNEE ARTHROPLASTY Left 08/02/2015   Procedure: LEFT TOTAL KNEE ARTHROPLASTY;  Surgeon: Mcarthur Rossetti, MD;  Location: [facility] ORS;  Service: Orthopedics;  Laterality: Left;   TOTAL KNEE  ARTHROPLASTY Right 12/13/2015   Procedure: RIGHT TOTAL KNEE ARTHROPLASTY;  Surgeon: Mcarthur Rossetti, MD;  Location: [facility] ORS;  Service: Orthopedics;  Laterality: Right;    Social History   Socioeconomic History   Marital status: Single    Spouse name: Not on file   Number of children: Not on file   Years of education: Not on file   Highest education level: Not on file  Occupational History   Not on file  Tobacco Use   Smoking status: Former    Packs/day: 0.25    Years: 50.00    Total pack years: 12.50    Types: Cigarettes    Quit date: 08/06/2020    Years since quitting: 1.6   Smokeless tobacco: Current    Types: Chew  Vaping Use   Vaping Use: Never used  Substance and Sexual Activity   Alcohol use: No    Alcohol/week: 0.0 standard drinks of alcohol   Drug use: No   Sexual activity: Not Currently  Other Topics Concern   Not on file  Social History Narrative   Not on file   Social Determinants of Health   Financial Resource Strain: Not on file  Food Insecurity: Not on file  Transportation Needs: Not on file  Physical Activity: Not on file  Stress: Not on file  Social Connections: Not on file  Intimate Partner Violence: Not on file    Family History  Problem Relation Age of Onset   Hypertension Father    Deep vein thrombosis Father    Dementia Mother    Diabetes Sister    Congestive Heart Failure Sister    Congestive Heart Failure Brother    Congestive Heart Failure Daughter    Congestive Heart Failure Son    Breast cancer Maternal Aunt        great aunt     Current Outpatient Medications:    acetaminophen (TYLENOL) 325 MG tablet, Take 2 tablets (650 mg total) by mouth every 6 (six) hours as needed for mild pain or fever., Disp: 60 tablet, Rfl: 0   amoxicillin (AMOXIL) 500 MG capsule, Take 500 mg by mouth 4 (four) times daily. For salmonella, Disp: , Rfl:    calcium carbonate (OS-CAL - DOSED IN MG OF ELEMENTAL CALCIUM) 1250 (500 Ca) MG tablet, Take 2  tablets by mouth in the morning., Disp: , Rfl:    cyclobenzaprine (FLEXERIL) 10 MG tablet, Take 1 tablet (10 mg total) by mouth at bedtime., Disp: 30 tablet, Rfl: 5  diphenhydrAMINE (BENADRYL) 25 mg capsule, Take 25 mg by mouth every 6 (six) hours as needed for itching or allergies., Disp: , Rfl:    docusate sodium (COLACE) 100 MG capsule, Take 200 mg by mouth at bedtime as needed for mild constipation., Disp: , Rfl:    ferrous sulfate 325 (65 FE) MG tablet, Take 325 mg by mouth 2 (two) times daily with a meal., Disp: , Rfl:    fluconazole (DIFLUCAN) 100 MG tablet, Take 100 mg by mouth daily., Disp: , Rfl:    gabapentin (NEURONTIN) 600 MG tablet, Take 1 tablet (600 mg total) by mouth 3 (three) times daily., Disp: 90 tablet, Rfl: 3   ipratropium-albuterol (DUONEB) 0.5-2.5 (3) MG/3ML SOLN, Take 3 mLs by nebulization every 6 (six) hours as needed (shortness of breath)., Disp: , Rfl:    levocetirizine (XYZAL) 5 MG tablet, Take 5 mg by mouth every evening., Disp: , Rfl:    nortriptyline (PAMELOR) 50 MG capsule, Take 50 mg by mouth at bedtime., Disp: , Rfl:    nystatin (MYCOSTATIN) 100000 UNIT/ML suspension, Take 5 mLs by mouth 4 (four) times daily., Disp: , Rfl:    nystatin cream (MYCOSTATIN), Apply 1 application topically 2 (two) times daily. (Apply under skin folds), Disp: , Rfl:    oxyCODONE-acetaminophen (PERCOCET) 10-325 MG tablet, Take 1 tablet by mouth every 4 (four) hours as needed for pain., Disp: 150 tablet, Rfl: 0   pantoprazole (PROTONIX) 40 MG tablet, Take 40 mg by mouth daily., Disp: , Rfl:    polyethylene glycol (MIRALAX / GLYCOLAX) 17 g packet, Take 17 g by mouth daily as needed for moderate constipation., Disp: 14 each, Rfl: 0   potassium chloride SA (KLOR-CON M) 20 MEQ tablet, Take 20 mEq by mouth 2 (two) times daily., Disp: , Rfl:    predniSONE (DELTASONE) 10 MG tablet, Take 5 mg by mouth in the morning., Disp: , Rfl:    torsemide (DEMADEX) 10 MG tablet, Take 10 mg by mouth daily.,  Disp: , Rfl:    apixaban (ELIQUIS) 5 MG TABS tablet, Take 1 tablet (5 mg total) by mouth 2 (two) times daily., Disp: 60 tablet, Rfl: 0   ondansetron (ZOFRAN) 4 MG tablet, Take 1 tablet (4 mg total) by mouth every 6 (six) hours as needed for nausea. (Patient not taking: Reported on 03/17/2022), Disp: 20 tablet, Rfl: 0   [START ON 03/30/2022] oxyCODONE-acetaminophen (PERCOCET) 10-325 MG tablet, Take 1 tablet by mouth every 4 (four) hours as needed for pain. (Patient not taking: Reported on 03/17/2022), Disp: 150 tablet, Rfl: 0  Physical exam:  Vitals:   03/17/22 1133  BP: 105/76  Pulse: 96  Resp: 16  Temp: 98.4 F (36.9 C)  TempSrc: Tympanic  Weight: (!) 306 lb (138.8 kg)  Height: '5\' 7"'$  (1.702 m)   Physical Exam Constitutional:      General: She is not in acute distress.    Appearance: She is obese.     Comments: Sitting in a wheelchair  Cardiovascular:     Rate and Rhythm: Normal rate and regular rhythm.     Heart sounds: Normal heart sounds.  Pulmonary:     Effort: Pulmonary effort is normal.     Breath sounds: Normal breath sounds.  Abdominal:     General: Bowel sounds are normal.     Palpations: Abdomen is soft.  Musculoskeletal:     Cervical back: Normal range of motion.  Skin:    General: Skin is warm and dry.  Neurological:  Mental Status: She is alert and oriented to person, place, and time.         Latest Ref Rng & Units 09/10/2021    9:39 AM  CMP  Glucose 70 - 99 mg/dL 95   BUN 8 - 23 mg/dL 22   Creatinine 0.44 - 1.00 mg/dL 1.37   Sodium 135 - 145 mmol/L 141   Potassium 3.5 - 5.1 mmol/L 4.1   Chloride 98 - 111 mmol/L 105   CO2 22 - 32 mmol/L 29   Calcium 8.9 - 10.3 mg/dL 8.7   Total Protein 6.5 - 8.1 g/dL 6.6   Total Bilirubin 0.3 - 1.2 mg/dL 0.2   Alkaline Phos 38 - 126 U/L 77   AST 15 - 41 U/L 23   ALT 0 - 44 U/L 13       Latest Ref Rng & Units 03/17/2022   10:16 AM  CBC  WBC 4.0 - 10.5 K/uL 6.7   Hemoglobin 12.0 - 15.0 g/dL 11.4   Hematocrit 36.0  - 46.0 % 37.5   Platelets 150 - 400 K/uL 365      Assessment and plan- Patient is a 73 y.o. female who is here for follow-up of following issues:  History of pulmonary embolism: This was unprovoked and patient continues to have multiple risk factors including obesity and relative immobilization.  She will remain on Eliquis indefinitely  Normocytic anemia: Presently at her baseline with a hemoglobin of 11.4 today.Iron studies are normal.  Folate is normal.  B12 is currently pending.  CBC ferritin and iron studies in 6 months in 1 year and I will see her back in 1 year   Visit Diagnosis 1. Normocytic anemia      Dr. Randa Evens, MD, MPH [facility] at [facility] [id number] 03/17/2022 3:55 PM

## 2022-04-22 ENCOUNTER — Ambulatory Visit: Payer: Medicare Other | Attending: Anesthesiology | Admitting: Anesthesiology

## 2022-04-22 ENCOUNTER — Encounter: Payer: Self-pay | Admitting: Anesthesiology

## 2022-04-22 DIAGNOSIS — M25552 Pain in left hip: Secondary | ICD-10-CM

## 2022-04-22 DIAGNOSIS — M25511 Pain in right shoulder: Secondary | ICD-10-CM

## 2022-04-22 DIAGNOSIS — F1190 Opioid use, unspecified, uncomplicated: Secondary | ICD-10-CM

## 2022-04-22 DIAGNOSIS — M5442 Lumbago with sciatica, left side: Secondary | ICD-10-CM | POA: Diagnosis not present

## 2022-04-22 DIAGNOSIS — I5032 Chronic diastolic (congestive) heart failure: Secondary | ICD-10-CM

## 2022-04-22 DIAGNOSIS — M5441 Lumbago with sciatica, right side: Secondary | ICD-10-CM | POA: Diagnosis not present

## 2022-04-22 DIAGNOSIS — M1712 Unilateral primary osteoarthritis, left knee: Secondary | ICD-10-CM

## 2022-04-22 DIAGNOSIS — M316 Other giant cell arteritis: Secondary | ICD-10-CM

## 2022-04-22 DIAGNOSIS — M48062 Spinal stenosis, lumbar region with neurogenic claudication: Secondary | ICD-10-CM

## 2022-04-22 DIAGNOSIS — M5136 Other intervertebral disc degeneration, lumbar region: Secondary | ICD-10-CM

## 2022-04-22 DIAGNOSIS — M19011 Primary osteoarthritis, right shoulder: Secondary | ICD-10-CM

## 2022-04-22 DIAGNOSIS — G8929 Other chronic pain: Secondary | ICD-10-CM

## 2022-04-22 DIAGNOSIS — M25551 Pain in right hip: Secondary | ICD-10-CM

## 2022-04-22 DIAGNOSIS — G894 Chronic pain syndrome: Secondary | ICD-10-CM

## 2022-04-22 MED ORDER — GABAPENTIN 600 MG PO TABS: 600.0000 mg | ORAL_TABLET | Freq: Three times a day (TID) | ORAL | 3 refills | Status: DC

## 2022-04-22 MED ORDER — OXYCODONE-ACETAMINOPHEN 10-325 MG PO TABS: 1.0000 | ORAL_TABLET | ORAL | 0 refills | Status: AC | PRN

## 2022-04-22 NOTE — Progress Notes (Signed)
Virtual Visit via Telephone Note  I connected with Wendy FiscalBarbara T Sanchez on 04/22/22 at  2:20 PM EDT by telephone and verified that I am speaking with the correct person using two identifiers.  Location: Patient: Home Provider: Pain control center   I discussed the limitations, risks, security and privacy concerns of performing an evaluation and management service by telephone and the availability of in person appointments. I also discussed with the patient that there may be a patient responsible charge related to this service. The patient expressed understanding and agreed to proceed.   History of Present Illness: I was able to speak to [facility] via telephone.  We are unable like for the video portion conference.  She reports that her low back pain and knee and hip pain are stable in nature with no recent changes.  The quality characteristic and distribution of these remain unchanged.  She still taking her medications as prescribed.  She generally uses these about every 4 hours to average about 5 times a day.  Unfortunately without the medication she experiences intractable pain that is unremitting and is quite dependent on the medications.  They do enable her to stay somewhat active and sleep better at night.  She has better pain control throughout the day despite her primarily inactive existing lifestyle.  She mentions that she is still fighting off a chronic kidney infection and requiring intermittent antibiotics for that.  In regards to her pain management she is doing reasonably well getting about 50 to 60% relief when she takes her medications for about 4 hours or so before she has recurrence of the same pain.  No change in lower extremity strength function bowel or bladder function is noted at this time.j  Review of systems: General: No fevers or chills Pulmonary: No shortness of breath or dyspnea Cardiac: No angina or palpitations or lightheadedness GI: No abdominal pain or  constipation Psych: No depression    Observations/Objective:  Current Outpatient Medications:    [START ON 05/29/2022] oxyCODONE-acetaminophen (PERCOCET) 10-325 MG tablet, Take 1 tablet by mouth every 4 (four) hours as needed for pain., Disp: 150 tablet, Rfl: 0   acetaminophen (TYLENOL) 325 MG tablet, Take 2 tablets (650 mg total) by mouth every 6 (six) hours as needed for mild pain or fever., Disp: 60 tablet, Rfl: 0   amoxicillin (AMOXIL) 500 MG capsule, Take 500 mg by mouth 4 (four) times daily. For salmonella, Disp: , Rfl:    apixaban (ELIQUIS) 5 MG TABS tablet, Take 1 tablet (5 mg total) by mouth 2 (two) times daily., Disp: 60 tablet, Rfl: 0   calcium carbonate (OS-CAL - DOSED IN MG OF ELEMENTAL CALCIUM) 1250 (500 Ca) MG tablet, Take 2 tablets by mouth in the morning., Disp: , Rfl:    cyclobenzaprine (FLEXERIL) 10 MG tablet, Take 1 tablet (10 mg total) by mouth at bedtime., Disp: 30 tablet, Rfl: 5   diphenhydrAMINE (BENADRYL) 25 mg capsule, Take 25 mg by mouth every 6 (six) hours as needed for itching or allergies., Disp: , Rfl:    docusate sodium (COLACE) 100 MG capsule, Take 200 mg by mouth at bedtime as needed for mild constipation., Disp: , Rfl:    ferrous sulfate 325 (65 FE) MG tablet, Take 325 mg by mouth 2 (two) times daily with a meal., Disp: , Rfl:    fluconazole (DIFLUCAN) 100 MG tablet, Take 100 mg by mouth daily., Disp: , Rfl:    gabapentin (NEURONTIN) 600 MG tablet, Take 1 tablet (600 mg  total) by mouth 3 (three) times daily., Disp: 90 tablet, Rfl: 3   ipratropium-albuterol (DUONEB) 0.5-2.5 (3) MG/3ML SOLN, Take 3 mLs by nebulization every 6 (six) hours as needed (shortness of breath)., Disp: , Rfl:    levocetirizine (XYZAL) 5 MG tablet, Take 5 mg by mouth every evening., Disp: , Rfl:    nortriptyline (PAMELOR) 50 MG capsule, Take 50 mg by mouth at bedtime., Disp: , Rfl:    nystatin (MYCOSTATIN) 100000 UNIT/ML suspension, Take 5 mLs by mouth 4 (four) times daily., Disp: , Rfl:     nystatin cream (MYCOSTATIN), Apply 1 application topically 2 (two) times daily. (Apply under skin folds), Disp: , Rfl:    ondansetron (ZOFRAN) 4 MG tablet, Take 1 tablet (4 mg total) by mouth every 6 (six) hours as needed for nausea. (Patient not taking: Reported on 03/17/2022), Disp: 20 tablet, Rfl: 0   [START ON 04/29/2022] oxyCODONE-acetaminophen (PERCOCET) 10-325 MG tablet, Take 1 tablet by mouth every 4 (four) hours as needed for pain., Disp: 150 tablet, Rfl: 0   pantoprazole (PROTONIX) 40 MG tablet, Take 40 mg by mouth daily., Disp: , Rfl:    polyethylene glycol (MIRALAX / GLYCOLAX) 17 g packet, Take 17 g by mouth daily as needed for moderate constipation., Disp: 14 each, Rfl: 0   potassium chloride SA (KLOR-CON M) 20 MEQ tablet, Take 20 mEq by mouth 2 (two) times daily., Disp: , Rfl:    predniSONE (DELTASONE) 10 MG tablet, Take 5 mg by mouth in the morning., Disp: , Rfl:    torsemide (DEMADEX) 10 MG tablet, Take 10 mg by mouth daily., Disp: , Rfl:    Past Medical History:  Diagnosis Date   Allergic rhinitis    Arthritis    Asthma    problems with fumes and aerosols cause asthma   Chest pain 10/19/2016   CHF (congestive heart failure)    Complication of anesthesia    awakens during surgery; has occurred with last 3-4 surgeries    COPD (chronic obstructive pulmonary disease)    DVT (deep venous thrombosis) 07/13/2018   Right leg   Environmental allergies    fumes    Fibromyalgia    GERD (gastroesophageal reflux disease)    Headache    History of bronchitis    Hx of total knee arthroplasty 12/13/2015   Hyperlipidemia    Hypertension    Morbid obesity with BMI of 45.0-49.9, adult 07/25/2015   NICM (nonischemic cardiomyopathy)    a. ? PVC mediated;  b. 06/2016 Echo: EF 25-30%, mild LVH, mild LAE, mild AI/MR/TR/PR; c. 08/2016 Cath: nl cors, EF 25%.   Numbness    hands bilat when driving; improves when not preforming task    Osteoarthritis of left knee 08/02/2015   PE (pulmonary  thromboembolism) 07/13/2018   Pes anserinus bursitis of left knee 05/18/2016   Presence of right artificial knee joint 05/18/2016   PVC (premature ventricular contraction) 06/04/2017   PVC's (premature ventricular contractions)    a. 11/2016 Amio started;  b. 11/29/16 48h Holter: 57574 PVC's (41%); c. 12/2016 24h Holter: 18040 PVC's (15%); c. 02/2017 48h Holter: 37262 PVC's (41%).   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   Temporal arteritis 05/01/2020   Tinnitus    comes and goes    Unilateral primary osteoarthritis, right knee 12/13/2015   Vertigo    none  for over 2 yrs     Assessment and Plan: 1. DDD (degenerative disc disease), lumbar   2. Chronic bilateral low back pain with bilateral sciatica   3. Chronic, continuous use of opioids   4. Chronic pain syndrome   5. Hip pain, bilateral   6. Spinal stenosis of lumbar region with neurogenic claudication   7. Chronic pain of right hip   8. Temporal arteritis   9. Pain in joint of right shoulder   10. Primary osteoarthritis of right shoulder   11. Osteoarthritis of left knee, unspecified osteoarthritis type   12. Chronic diastolic CHF (congestive heart failure)   Based on our discussion it is appropriate to refill her medicines for the next 2 months dated for April 17 and May 17.  Will schedule her for return to clinic in 2 months in person.  She will be due for urine screen here soon.  No other changes in her pharmacologic regimen will be initiated at this time.  Continue follow-up with her primary care physician for baseline medical care.  Follow Up Instructions:    I discussed the assessment and treatment plan with the patient. The patient was provided an opportunity to ask questions and all were answered. The patient agreed with the plan and demonstrated an understanding of the instructions.   The  patient was advised to call back or seek an in-person evaluation if the symptoms worsen or if the condition fails to improve as anticipated.  I provided 30 minutes of non-face-to-face time during this encounter.   Yevette Edwards, MD

## 2022-07-01 ENCOUNTER — Encounter: Payer: Self-pay | Admitting: Anesthesiology

## 2022-07-01 ENCOUNTER — Ambulatory Visit: Payer: Medicare Other | Attending: Anesthesiology | Admitting: Anesthesiology

## 2022-07-01 VITALS — BP 104/64 | HR 90 | Temp 98.7°F | Resp 18 | Ht 68.0 in | Wt 298.0 lb

## 2022-07-01 DIAGNOSIS — M19011 Primary osteoarthritis, right shoulder: Secondary | ICD-10-CM

## 2022-07-01 DIAGNOSIS — M25552 Pain in left hip: Secondary | ICD-10-CM | POA: Insufficient documentation

## 2022-07-01 DIAGNOSIS — M1712 Unilateral primary osteoarthritis, left knee: Secondary | ICD-10-CM

## 2022-07-01 DIAGNOSIS — M5441 Lumbago with sciatica, right side: Secondary | ICD-10-CM | POA: Diagnosis not present

## 2022-07-01 DIAGNOSIS — M316 Other giant cell arteritis: Secondary | ICD-10-CM | POA: Diagnosis present

## 2022-07-01 DIAGNOSIS — M5442 Lumbago with sciatica, left side: Secondary | ICD-10-CM | POA: Insufficient documentation

## 2022-07-01 DIAGNOSIS — M48062 Spinal stenosis, lumbar region with neurogenic claudication: Secondary | ICD-10-CM | POA: Diagnosis present

## 2022-07-01 DIAGNOSIS — I5032 Chronic diastolic (congestive) heart failure: Secondary | ICD-10-CM

## 2022-07-01 DIAGNOSIS — M25551 Pain in right hip: Secondary | ICD-10-CM | POA: Diagnosis present

## 2022-07-01 DIAGNOSIS — M25511 Pain in right shoulder: Secondary | ICD-10-CM

## 2022-07-01 DIAGNOSIS — M5136 Other intervertebral disc degeneration, lumbar region: Secondary | ICD-10-CM | POA: Diagnosis not present

## 2022-07-01 DIAGNOSIS — G8929 Other chronic pain: Secondary | ICD-10-CM

## 2022-07-01 DIAGNOSIS — G894 Chronic pain syndrome: Secondary | ICD-10-CM | POA: Diagnosis present

## 2022-07-01 DIAGNOSIS — F1190 Opioid use, unspecified, uncomplicated: Secondary | ICD-10-CM | POA: Diagnosis present

## 2022-07-01 DIAGNOSIS — M51369 Other intervertebral disc degeneration, lumbar region without mention of lumbar back pain or lower extremity pain: Secondary | ICD-10-CM

## 2022-07-01 MED ORDER — OXYCODONE-ACETAMINOPHEN 10-325 MG PO TABS: 1.0000 | ORAL_TABLET | ORAL | 0 refills | Status: DC | PRN

## 2022-07-01 MED ORDER — OXYCODONE-ACETAMINOPHEN 10-325 MG PO TABS: 1.0000 | ORAL_TABLET | ORAL | 0 refills | Status: AC | PRN

## 2022-07-01 MED ORDER — CYCLOBENZAPRINE HCL 10 MG PO TABS: 10.0000 mg | ORAL_TABLET | Freq: Every day | ORAL | 5 refills | Status: AC

## 2022-07-01 MED ORDER — GABAPENTIN 600 MG PO TABS: 600.0000 mg | ORAL_TABLET | Freq: Three times a day (TID) | ORAL | 3 refills | Status: DC

## 2022-07-02 NOTE — Progress Notes (Signed)
Subjective:  Patient ID: Wendy Sanchez, female    DOB: August 28, 1950  Age: 72 y.o. MRN: [id number]  CC: Back Pain, Leg Pain, and Arm Pain   Procedure: None  HPI Wendy Sanchez presents for return appointment.  She is doing well in regards to her systemic body pain primarily affecting her back hips knees.  She is taking her medications as prescribed and continues to take her oxycodone 5 times a day.  No side effects are noted with the medication and she reports about 60% improvement when she takes the medicine lasting about 4 to 6 hours before she has recurrence of her same baseline pain.  In this regard she has been doing well but she reports that unfortunately she has had problems with recurrent systemic infection.  She has had problems with UTIs and some GI dysfunction as well.  She was recently reportedly positive for Salmonella and E. coli and is on chronic antibiotic therapy for that.  She does have multiple other systemic issues that have been problematic for her but she feels that these are otherwise generally stable at present.  She denies any side effect with the medication and can continues to get good relief.  She is pleased with her response to therapy.  Outpatient Medications Prior to Visit  Medication Sig Dispense Refill   acetaminophen (TYLENOL) 325 MG tablet Take 2 tablets (650 mg total) by mouth every 6 (six) hours as needed for mild pain or fever. 60 tablet 0   apixaban (ELIQUIS) 5 MG TABS tablet Take 1 tablet (5 mg total) by mouth 2 (two) times daily. 60 tablet 0   calcium carbonate (OS-CAL - DOSED IN MG OF ELEMENTAL CALCIUM) 1250 (500 Ca) MG tablet Take 2 tablets by mouth in the morning.     diphenhydrAMINE (BENADRYL) 25 mg capsule Take 25 mg by mouth every 6 (six) hours as needed for itching or allergies.     docusate sodium (COLACE) 100 MG capsule Take 200 mg by mouth at bedtime as needed for mild constipation.     ferrous sulfate 325 (65 FE) MG tablet Take 325 mg by mouth 2  (two) times daily with a meal.     fluconazole (DIFLUCAN) 100 MG tablet Take 100 mg by mouth daily.     ipratropium-albuterol (DUONEB) 0.5-2.5 (3) MG/3ML SOLN Take 3 mLs by nebulization every 6 (six) hours as needed (shortness of breath).     levocetirizine (XYZAL) 5 MG tablet Take 10 mg by mouth every evening.     nortriptyline (PAMELOR) 50 MG capsule Take 50 mg by mouth at bedtime.     nystatin (MYCOSTATIN) 100000 UNIT/ML suspension Take 5 mLs by mouth 4 (four) times daily.     nystatin cream (MYCOSTATIN) Apply 1 application topically 2 (two) times daily. (Apply under skin folds)     oxyCODONE-acetaminophen (PERCOCET) 10-325 MG tablet Take 1 tablet by mouth every 4 (four) hours as needed for pain.     pantoprazole (PROTONIX) 40 MG tablet Take 40 mg by mouth daily.     polyethylene glycol (MIRALAX / GLYCOLAX) 17 g packet Take 17 g by mouth daily as needed for moderate constipation. 14 each 0   potassium chloride SA (KLOR-CON M) 20 MEQ tablet Take 20 mEq by mouth 2 (two) times daily.     predniSONE (DELTASONE) 10 MG tablet Take 1 mg by mouth in the morning.     torsemide (DEMADEX) 10 MG tablet Take 10 mg by mouth daily.  cyclobenzaprine (FLEXERIL) 10 MG tablet Take 10 mg by mouth 3 (three) times daily as needed for muscle spasms.     gabapentin (NEURONTIN) 600 MG tablet Take 1 tablet (600 mg total) by mouth 3 (three) times daily. 90 tablet 3   amoxicillin (AMOXIL) 500 MG capsule Take 500 mg by mouth 4 (four) times daily. For salmonella (Patient not taking: Reported on 07/01/2022)     ondansetron (ZOFRAN) 4 MG tablet Take 1 tablet (4 mg total) by mouth every 6 (six) hours as needed for nausea. (Patient not taking: Reported on 03/17/2022) 20 tablet 0   No facility-administered medications prior to visit.    Review of Systems CNS: No confusion or sedation Cardiac: No angina or palpitations GI: No abdominal pain or constipation Constitutional: No nausea vomiting fevers or chills  Objective:   BP 104/64 (BP Location: Left Arm, Patient Position: Sitting, Cuff Size: Large)   Pulse 90   Temp 98.7 F (37.1 C) (Oral)   Resp 18   Ht 5\' 8"  (1.727 m)   Wt 298 lb (135.2 kg)   SpO2 96%   BMI 45.31 kg/m    BP Readings from Last 3 Encounters:  07/01/22 104/64  03/17/22 105/76  09/10/21 (!) 80/67     Wt Readings from Last 3 Encounters:  07/01/22 298 lb (135.2 kg)  03/17/22 (!) 306 lb (138.8 kg)  09/10/21 (!) 303 lb 9.6 oz (137.7 kg)     Physical Exam Pt is alert and oriented PERRL EOMI HEART IS RRR no murmur or rub LCTA no wheezing or rales MUSCULOSKELETAL reveals some paraspinous muscle tenderness in the lumbar region but no overt trigger points.  She is in a wheelchair and strength testing was not performed today.  Muscle tone and bulk is at baseline.  Labs  No results found for: "HGBA1C" Lab Results  Component Value Date   LDLCALC (H) 03/16/2007    150        Total Cholesterol/HDL:CHD Risk Coronary Heart Disease Risk Table                     Men   Women  1/2 Average Risk   3.4   3.3   CREATININE 1.37 (H) 09/10/2021    -------------------------------------------------------------------------------------------------------------------- Lab Results  Component Value Date   WBC 6.7 03/17/2022   HGB 11.4 (L) 03/17/2022   HCT 37.5 03/17/2022   PLT 365 03/17/2022   GLUCOSE 95 09/10/2021   CHOL (H) 03/16/2007    236        ATP III CLASSIFICATION:  <200     mg/dL   Desirable  161-096  mg/dL   Borderline High  >=045    mg/dL   High   TRIG 76 40/98/1191   HDL 71 03/16/2007   LDLCALC (H) 03/16/2007    150        Total Cholesterol/HDL:CHD Risk Coronary Heart Disease Risk Table                     Men   Women  1/2 Average Risk   3.4   3.3   ALT 13 09/10/2021   AST 23 09/10/2021   NA 141 09/10/2021   K 4.1 09/10/2021   CL 105 09/10/2021   CREATININE 1.37 (H) 09/10/2021   BUN 22 09/10/2021   CO2 29 09/10/2021   TSH 1.080 11/25/2020   INR 1.2 01/31/2021     --------------------------------------------------------------------------------------------------------------------- CT ABDOMEN PELVIS WO CONTRAST  Result Date: 01/30/2021 CLINICAL DATA:  Shivering and headache. On antibiotics for diverticulitis. EXAM: CT ABDOMEN AND PELVIS WITHOUT CONTRAST TECHNIQUE: Multidetector CT imaging of the abdomen and pelvis was performed following the standard protocol without IV contrast. RADIATION DOSE REDUCTION: This exam was performed according to the departmental dose-optimization program which includes automated exposure control, adjustment of the mA and/or kV according to patient size and/or use of iterative reconstruction technique. COMPARISON:  July 31, 2013 FINDINGS: Lower chest: Mild atelectasis is seen within the posterior aspects of the bilateral lung bases. Hepatobiliary: Punctate calcified granulomas are seen scattered throughout the liver parenchyma. Status post cholecystectomy. No biliary dilatation. Pancreas: Unremarkable. No pancreatic ductal dilatation or surrounding inflammatory changes. Spleen: Normal in size without focal abnormality. Adrenals/Urinary Tract: Adrenal glands are unremarkable. Kidneys are normal in size, without renal calculi or hydronephrosis. 5 mm and 10 mm hyperdense foci are seen within the right kidney. A similar appearing 7 mm hyperdense focus is noted within the posterolateral aspect of the mid to lower left kidney. Bladder is unremarkable. Stomach/Bowel: There is a very small hiatal hernia. Surgical sutures are noted within the gastric region. Appendix appears normal. No evidence of bowel dilatation. Mildly inflamed diverticula are seen within the mid to distal descending colon. There is no evidence of associated perforation or abscess. Noninflamed diverticula are seen throughout the remainder of the large bowel. Vascular/Lymphatic: Aortic atherosclerosis. No enlarged abdominal or pelvic lymph nodes. Reproductive: Status post  hysterectomy. No adnexal masses. Other: A left lower quadrant ostomy site is present. No abdominopelvic ascites. Musculoskeletal: Multilevel degenerative changes are seen throughout the lumbar spine with a chronic fracture deformity seen along the superior endplate of the T12 vertebral body. IMPRESSION: 1. Mild diverticulitis involving the mid to distal descending colon. 2. Evidence of prior gastric surgery and left lower quadrant ostomy site. 3. Small hiatal hernia. 4. Findings likely consistent with prior cholecystectomy and hysterectomy. 5. Chronic fracture deformity along the superior endplate of the T12 vertebral body. 6. Aortic atherosclerosis. Aortic Atherosclerosis (ICD10-I70.0). Electronically Signed   By: Aram Candela M.D.   On: 01/30/2021 22:08   DG Chest 2 View  Result Date: 01/30/2021 CLINICAL DATA:  Best images obtainable due to patients condition. [facility] for chills and generalized bodyaches. Pt alert and oriented. NAD noted Pt currently on antibiotics for "infection"infection EXAM: CHEST - 2 VIEW COMPARISON:  None. FINDINGS: RIGHT PICC line tip in distal SVC. Stable large cardiac silhouette. Mild central venous congestion. No focal infiltrate. No pneumothorax. IMPRESSION: Central venous congestion.  Low lung volumes. Electronically Signed   By: Genevive Bi M.D.   On: 01/30/2021 18:22     Assessment & Plan:   Joree was seen today for back pain, leg pain and arm pain.  Diagnoses and all orders for this visit:  Chronic pain syndrome -     ToxASSURE Select 13 (MW), Urine  DDD (degenerative disc disease), lumbar  Chronic bilateral low back pain with bilateral sciatica  Chronic, continuous use of opioids  Hip pain, bilateral  Spinal stenosis of lumbar region with neurogenic claudication  Temporal arteritis (HCC)  Pain in joint of right shoulder  Chronic pain of right hip  Primary osteoarthritis of right shoulder  Osteoarthritis of  left knee, unspecified osteoarthritis type  Chronic diastolic CHF (congestive heart failure) (HCC)  Other orders -     oxyCODONE-acetaminophen (PERCOCET) 10-325 MG tablet; Take 1 tablet by mouth every 4 (four) hours as needed for pain. -     oxyCODONE-acetaminophen (PERCOCET) 10-325 MG  tablet; Take 1 tablet by mouth every 4 (four) hours as needed for pain. -     cyclobenzaprine (FLEXERIL) 10 MG tablet; Take 1 tablet (10 mg total) by mouth at bedtime. -     gabapentin (NEURONTIN) 600 MG tablet; Take 1 tablet (600 mg total) by mouth 3 (three) times daily.        ----------------------------------------------------------------------------------------------------------------------  Problem List Items Addressed This Visit       Unprioritized   Chronic diastolic CHF (congestive heart failure) (HCC)   Chronic low back pain   Relevant Medications   oxyCODONE-acetaminophen (PERCOCET) 10-325 MG tablet   oxyCODONE-acetaminophen (PERCOCET) 10-325 MG tablet   oxyCODONE-acetaminophen (PERCOCET) 10-325 MG tablet (Start on 07/31/2022)   cyclobenzaprine (FLEXERIL) 10 MG tablet   gabapentin (NEURONTIN) 600 MG tablet   Chronic pain syndrome - Primary   Relevant Medications   oxyCODONE-acetaminophen (PERCOCET) 10-325 MG tablet   oxyCODONE-acetaminophen (PERCOCET) 10-325 MG tablet   oxyCODONE-acetaminophen (PERCOCET) 10-325 MG tablet (Start on 07/31/2022)   cyclobenzaprine (FLEXERIL) 10 MG tablet   gabapentin (NEURONTIN) 600 MG tablet   Other Relevant Orders   ToxASSURE Select 13 (MW), Urine   Chronic, continuous use of opioids   DDD (degenerative disc disease), lumbar   Relevant Medications   oxyCODONE-acetaminophen (PERCOCET) 10-325 MG tablet   oxyCODONE-acetaminophen (PERCOCET) 10-325 MG tablet   oxyCODONE-acetaminophen (PERCOCET) 10-325 MG tablet (Start on 07/31/2022)   cyclobenzaprine (FLEXERIL) 10 MG tablet   Hip pain, bilateral   Osteoarthritis of left knee   Relevant Medications    oxyCODONE-acetaminophen (PERCOCET) 10-325 MG tablet   oxyCODONE-acetaminophen (PERCOCET) 10-325 MG tablet   oxyCODONE-acetaminophen (PERCOCET) 10-325 MG tablet (Start on 07/31/2022)   cyclobenzaprine (FLEXERIL) 10 MG tablet   Pain in joint, shoulder region   Spinal stenosis of lumbar region   Other Visit Diagnoses     Temporal arteritis (HCC)       Chronic pain of right hip       Relevant Medications   oxyCODONE-acetaminophen (PERCOCET) 10-325 MG tablet   oxyCODONE-acetaminophen (PERCOCET) 10-325 MG tablet   oxyCODONE-acetaminophen (PERCOCET) 10-325 MG tablet (Start on 07/31/2022)   cyclobenzaprine (FLEXERIL) 10 MG tablet   gabapentin (NEURONTIN) 600 MG tablet   Primary osteoarthritis of right shoulder       Relevant Medications   oxyCODONE-acetaminophen (PERCOCET) 10-325 MG tablet   oxyCODONE-acetaminophen (PERCOCET) 10-325 MG tablet   oxyCODONE-acetaminophen (PERCOCET) 10-325 MG tablet (Start on 07/31/2022)   cyclobenzaprine (FLEXERIL) 10 MG tablet         ----------------------------------------------------------------------------------------------------------------------  1. Chronic pain syndrome Phylisha has had a very difficult situation secondary to the multiple chronic systemic problems she has had.  Her pain has been difficult to treat and quite recalcitrant but has responded to chronic opioid therapy.  She has no side effects from the medication.  She reports no sedation or respiratory side effects with her medicine and maintains that it is well received and enables her to stay comfortable throughout the day.  She denies any diverting or illicit use with the medicine and is pleased with her response to therapy where she has been quite overwhelmed by pain without opioid therapy. - ToxASSURE Select 13 (MW), Urine  2. DDD (degenerative disc disease), lumbar Continue stretching as tolerated and continue efforts at weight loss as possible which has been problematic for her.  3.  Chronic bilateral low back pain with bilateral sciatica   4. Chronic, continuous use of opioids Viewed the [facility] practitioner database information is appropriate for refill today.  5. Hip pain, bilateral   6. Spinal stenosis of lumbar region with neurogenic claudication   7. Temporal arteritis (HCC)   8. Pain in joint of right shoulder   9. Chronic pain of right hip   10. Primary osteoarthritis of right shoulder   11. Osteoarthritis of left knee, unspecified osteoarthritis type   12. Chronic diastolic CHF (congestive heart failure) (HCC)     ----------------------------------------------------------------------------------------------------------------------  I have changed Wendy Sanchez's cyclobenzaprine. I am also having her start on oxyCODONE-acetaminophen and oxyCODONE-acetaminophen. Additionally, I am having her maintain her nortriptyline, levocetirizine, diphenhydrAMINE, pantoprazole, apixaban, nystatin cream, ipratropium-albuterol, predniSONE, calcium carbonate, docusate sodium, polyethylene glycol, acetaminophen, nystatin, potassium chloride SA, ondansetron, torsemide, ferrous sulfate, amoxicillin, fluconazole, oxyCODONE-acetaminophen, and gabapentin.   Meds ordered this encounter  Medications   oxyCODONE-acetaminophen (PERCOCET) 10-325 MG tablet    Sig: Take 1 tablet by mouth every 4 (four) hours as needed for pain.    Dispense:  150 tablet    Refill:  0   oxyCODONE-acetaminophen (PERCOCET) 10-325 MG tablet    Sig: Take 1 tablet by mouth every 4 (four) hours as needed for pain.    Dispense:  150 tablet    Refill:  0   cyclobenzaprine (FLEXERIL) 10 MG tablet    Sig: Take 1 tablet (10 mg total) by mouth at bedtime.    Dispense:  30 tablet    Refill:  5   gabapentin (NEURONTIN) 600 MG tablet    Sig: Take 1 tablet (600 mg total) by mouth 3 (three) times daily.    Dispense:  90 tablet    Refill:  3   Patient's Medications  New Prescriptions    OXYCODONE-ACETAMINOPHEN (PERCOCET) 10-325 MG TABLET    Take 1 tablet by mouth every 4 (four) hours as needed for pain.   OXYCODONE-ACETAMINOPHEN (PERCOCET) 10-325 MG TABLET    Take 1 tablet by mouth every 4 (four) hours as needed for pain.  Previous Medications   ACETAMINOPHEN (TYLENOL) 325 MG TABLET    Take 2 tablets (650 mg total) by mouth every 6 (six) hours as needed for mild pain or fever.   AMOXICILLIN (AMOXIL) 500 MG CAPSULE    Take 500 mg by mouth 4 (four) times daily. For salmonella   APIXABAN (ELIQUIS) 5 MG TABS TABLET    Take 1 tablet (5 mg total) by mouth 2 (two) times daily.   CALCIUM CARBONATE (OS-CAL - DOSED IN MG OF ELEMENTAL CALCIUM) 1250 (500 CA) MG TABLET    Take 2 tablets by mouth in the morning.   DIPHENHYDRAMINE (BENADRYL) 25 MG CAPSULE    Take 25 mg by mouth every 6 (six) hours as needed for itching or allergies.   DOCUSATE SODIUM (COLACE) 100 MG CAPSULE    Take 200 mg by mouth at bedtime as needed for mild constipation.   FERROUS SULFATE 325 (65 FE) MG TABLET    Take 325 mg by mouth 2 (two) times daily with a meal.   FLUCONAZOLE (DIFLUCAN) 100 MG TABLET    Take 100 mg by mouth daily.   IPRATROPIUM-ALBUTEROL (DUONEB) 0.5-2.5 (3) MG/3ML SOLN    Take 3 mLs by nebulization every 6 (six) hours as needed (shortness of breath).   LEVOCETIRIZINE (XYZAL) 5 MG TABLET    Take 10 mg by mouth every evening.   NORTRIPTYLINE (PAMELOR) 50 MG CAPSULE    Take 50 mg by mouth at bedtime.   NYSTATIN (MYCOSTATIN) 100000 UNIT/ML SUSPENSION    Take 5 mLs by mouth 4 (four) times  daily.   NYSTATIN CREAM (MYCOSTATIN)    Apply 1 application topically 2 (two) times daily. (Apply under skin folds)   ONDANSETRON (ZOFRAN) 4 MG TABLET    Take 1 tablet (4 mg total) by mouth every 6 (six) hours as needed for nausea.   OXYCODONE-ACETAMINOPHEN (PERCOCET) 10-325 MG TABLET    Take 1 tablet by mouth every 4 (four) hours as needed for pain.   PANTOPRAZOLE (PROTONIX) 40 MG TABLET    Take 40 mg by mouth daily.    POLYETHYLENE GLYCOL (MIRALAX / GLYCOLAX) 17 G PACKET    Take 17 g by mouth daily as needed for moderate constipation.   POTASSIUM CHLORIDE SA (KLOR-CON M) 20 MEQ TABLET    Take 20 mEq by mouth 2 (two) times daily.   PREDNISONE (DELTASONE) 10 MG TABLET    Take 1 mg by mouth in the morning.   TORSEMIDE (DEMADEX) 10 MG TABLET    Take 10 mg by mouth daily.  Modified Medications   Modified Medication Previous Medication   CYCLOBENZAPRINE (FLEXERIL) 10 MG TABLET cyclobenzaprine (FLEXERIL) 10 MG tablet      Take 1 tablet (10 mg total) by mouth at bedtime.    Take 10 mg by mouth 3 (three) times daily as needed for muscle spasms.   GABAPENTIN (NEURONTIN) 600 MG TABLET gabapentin (NEURONTIN) 600 MG tablet      Take 1 tablet (600 mg total) by mouth 3 (three) times daily.    Take 1 tablet (600 mg total) by mouth 3 (three) times daily.  Discontinued Medications   No medications on file   ----------------------------------------------------------------------------------------------------------------------  Follow-up: Return in about 2 months (around 08/31/2022) for evaluation, med refill.    Yevette Edwards, MD

## 2022-07-06 LAB — TOXASSURE SELECT 13 (MW), URINE

## 2022-08-19 DIAGNOSIS — N390 Urinary tract infection, site not specified: Secondary | ICD-10-CM | POA: Diagnosis not present

## 2022-08-27 ENCOUNTER — Ambulatory Visit: Payer: Medicare HMO | Attending: Anesthesiology | Admitting: Anesthesiology

## 2022-08-27 DIAGNOSIS — G8929 Other chronic pain: Secondary | ICD-10-CM

## 2022-08-27 DIAGNOSIS — M25511 Pain in right shoulder: Secondary | ICD-10-CM

## 2022-08-27 DIAGNOSIS — F1190 Opioid use, unspecified, uncomplicated: Secondary | ICD-10-CM

## 2022-08-27 DIAGNOSIS — M25551 Pain in right hip: Secondary | ICD-10-CM

## 2022-08-27 DIAGNOSIS — G894 Chronic pain syndrome: Secondary | ICD-10-CM

## 2022-08-27 DIAGNOSIS — M5136 Other intervertebral disc degeneration, lumbar region: Secondary | ICD-10-CM

## 2022-08-27 DIAGNOSIS — M316 Other giant cell arteritis: Secondary | ICD-10-CM

## 2022-08-27 DIAGNOSIS — M48062 Spinal stenosis, lumbar region with neurogenic claudication: Secondary | ICD-10-CM

## 2022-08-28 ENCOUNTER — Telehealth: Payer: Self-pay | Admitting: Anesthesiology

## 2022-08-28 NOTE — Telephone Encounter (Signed)
Texted Dr. Andree Elk.

## 2022-08-28 NOTE — Telephone Encounter (Signed)
PT called stated that Wendy Sanchez never gave her a call on yesterday. PT had a vv,mm appt. PT states that she is trying to get her medication. Please give patient a call. TY

## 2022-08-31 MED ORDER — OXYCODONE-ACETAMINOPHEN 10-325 MG PO TABS: 1.0000 | ORAL_TABLET | ORAL | 0 refills | Status: AC | PRN

## 2022-08-31 NOTE — Telephone Encounter (Signed)
Dr. Pernell Dupre notified. Instructed to have patient come for in person appt in one month.. Patient notified, informed of her appt.

## 2022-09-01 ENCOUNTER — Ambulatory Visit: Payer: Medicare HMO | Attending: Anesthesiology | Admitting: Anesthesiology

## 2022-09-01 DIAGNOSIS — M316 Other giant cell arteritis: Secondary | ICD-10-CM

## 2022-09-01 DIAGNOSIS — M48062 Spinal stenosis, lumbar region with neurogenic claudication: Secondary | ICD-10-CM | POA: Diagnosis not present

## 2022-09-01 DIAGNOSIS — G894 Chronic pain syndrome: Secondary | ICD-10-CM | POA: Diagnosis not present

## 2022-09-01 DIAGNOSIS — M25551 Pain in right hip: Secondary | ICD-10-CM

## 2022-09-01 DIAGNOSIS — M25511 Pain in right shoulder: Secondary | ICD-10-CM | POA: Diagnosis not present

## 2022-09-01 DIAGNOSIS — F1190 Opioid use, unspecified, uncomplicated: Secondary | ICD-10-CM

## 2022-09-01 DIAGNOSIS — M25552 Pain in left hip: Secondary | ICD-10-CM

## 2022-09-01 DIAGNOSIS — M5136 Other intervertebral disc degeneration, lumbar region: Secondary | ICD-10-CM

## 2022-09-01 DIAGNOSIS — G8929 Other chronic pain: Secondary | ICD-10-CM

## 2022-09-01 DIAGNOSIS — M5442 Lumbago with sciatica, left side: Secondary | ICD-10-CM

## 2022-09-01 DIAGNOSIS — M1712 Unilateral primary osteoarthritis, left knee: Secondary | ICD-10-CM

## 2022-09-01 DIAGNOSIS — M5441 Lumbago with sciatica, right side: Secondary | ICD-10-CM

## 2022-09-01 DIAGNOSIS — M19011 Primary osteoarthritis, right shoulder: Secondary | ICD-10-CM

## 2022-09-01 DIAGNOSIS — Z79891 Long term (current) use of opiate analgesic: Secondary | ICD-10-CM

## 2022-09-02 ENCOUNTER — Encounter: Payer: Self-pay | Admitting: Anesthesiology

## 2022-09-02 NOTE — Progress Notes (Signed)
Virtual Visit via Telephone Note  I connected with LAM STILES on 09/02/22 at  1:00 PM EDT by telephone and verified that I am speaking with the correct person using two identifiers.  Location: Patient: home  [facility]    History of Present Illness:   Multiple attempts by telephone made and mailbox full. Observations/Objective:   Assessment and Plan: 1. Chronic pain syndrome   2. DDD (degenerative disc disease), lumbar   3. Chronic bilateral low back pain with bilateral sciatica   4. Spinal stenosis of lumbar region with neurogenic claudication   5. Chronic, continuous use of opioids   6. Hip pain, bilateral   7. Temporal arteritis (HCC)   8. Chronic pain of right hip   9. Pain in joint of right shoulder      Follow Up Instructions: Reschedule in 2 weeks   I discussed the assessment and treatment plan with the patient. The patient was provided an opportunity to ask questions and all were answered. The patient agreed with the plan and demonstrated an understanding of the instructions.   The patient was advised to call back or seek an in-person evaluation if the symptoms worsen or if the condition fails to improve as anticipated.  I provided 15 minutes of non-face-to-face time during this encounter.   Yevette Edwards, MD

## 2022-09-11 ENCOUNTER — Encounter: Payer: Self-pay | Admitting: Anesthesiology

## 2022-09-11 NOTE — Progress Notes (Signed)
Virtual Visit via Telephone Note  I connected with Wendy Sanchez on 09/11/22 at  8:00 AM EDT by telephone and verified that I am speaking with the correct person using two identifiers.  Location: Patient: Home Provider: Pain control center   I discussed the limitations, risks, security and privacy concerns of performing an evaluation and management service by telephone and the availability of in person appointments. I also discussed with the patient that there may be a patient responsible charge related to this service. The patient expressed understanding and agreed to proceed.   History of Present Illness: I spoke with Wendy Sanchez's sister on August 30 4 evaluation following multiple efforts on August 20 and a few days after to get in touch with her virtually.  Our nursing staff was able to get through to her and she is currently scheduled for a September 23 appointment.  She reports no difficulties with the medications nor problems that are changed in regards to her pain.   Observations/Objective:   Current Outpatient Medications:    acetaminophen (TYLENOL) 325 MG tablet, Take 2 tablets (650 mg total) by mouth every 6 (six) hours as needed for mild pain or fever., Disp: 60 tablet, Rfl: 0   amoxicillin (AMOXIL) 500 MG capsule, Take 500 mg by mouth 4 (four) times daily. For salmonella (Patient not taking: Reported on 07/01/2022), Disp: , Rfl:    apixaban (ELIQUIS) 5 MG TABS tablet, Take 1 tablet (5 mg total) by mouth 2 (two) times daily., Disp: 60 tablet, Rfl: 0   calcium carbonate (OS-CAL - DOSED IN MG OF ELEMENTAL CALCIUM) 1250 (500 Ca) MG tablet, Take 2 tablets by mouth in the morning., Disp: , Rfl:    diphenhydrAMINE (BENADRYL) 25 mg capsule, Take 25 mg by mouth every 6 (six) hours as needed for itching or allergies., Disp: , Rfl:    docusate sodium (COLACE) 100 MG capsule, Take 200 mg by mouth at bedtime as needed for mild constipation., Disp: , Rfl:    ferrous sulfate 325 (65 FE) MG  tablet, Take 325 mg by mouth 2 (two) times daily with a meal., Disp: , Rfl:    fluconazole (DIFLUCAN) 100 MG tablet, Take 100 mg by mouth daily., Disp: , Rfl:    gabapentin (NEURONTIN) 600 MG tablet, Take 1 tablet (600 mg total) by mouth 3 (three) times daily., Disp: 90 tablet, Rfl: 3   ipratropium-albuterol (DUONEB) 0.5-2.5 (3) MG/3ML SOLN, Take 3 mLs by nebulization every 6 (six) hours as needed (shortness of breath)., Disp: , Rfl:    levocetirizine (XYZAL) 5 MG tablet, Take 10 mg by mouth every evening., Disp: , Rfl:    nortriptyline (PAMELOR) 50 MG capsule, Take 50 mg by mouth at bedtime., Disp: , Rfl:    nystatin (MYCOSTATIN) 100000 UNIT/ML suspension, Take 5 mLs by mouth 4 (four) times daily., Disp: , Rfl:    nystatin cream (MYCOSTATIN), Apply 1 application topically 2 (two) times daily. (Apply under skin folds), Disp: , Rfl:    ondansetron (ZOFRAN) 4 MG tablet, Take 1 tablet (4 mg total) by mouth every 6 (six) hours as needed for nausea. (Patient not taking: Reported on 03/17/2022), Disp: 20 tablet, Rfl: 0   oxyCODONE-acetaminophen (PERCOCET) 10-325 MG tablet, Take 1 tablet by mouth every 4 (four) hours as needed for pain., Disp: 150 tablet, Rfl: 0   [START ON 09/29/2022] oxyCODONE-acetaminophen (PERCOCET) 10-325 MG tablet, Take 1 tablet by mouth every 4 (four) hours as needed for pain., Disp: 150 tablet, Rfl: 0   pantoprazole (  PROTONIX) 40 MG tablet, Take 40 mg by mouth daily., Disp: , Rfl:    polyethylene glycol (MIRALAX / GLYCOLAX) 17 g packet, Take 17 g by mouth daily as needed for moderate constipation., Disp: 14 each, Rfl: 0   potassium chloride SA (KLOR-CON M) 20 MEQ tablet, Take 20 mEq by mouth 2 (two) times daily., Disp: , Rfl:    predniSONE (DELTASONE) 10 MG tablet, Take 1 mg by mouth in the morning., Disp: , Rfl:    torsemide (DEMADEX) 10 MG tablet, Take 10 mg by mouth daily., Disp: , Rfl:    Past Medical History:  Diagnosis Date   Allergic rhinitis    Arthritis    Asthma     problems with fumes and aerosols cause asthma   Chest pain 10/19/2016   CHF (congestive heart failure) (HCC)    Complication of anesthesia    awakens during surgery; has occurred with last 3-4 surgeries    COPD (chronic obstructive pulmonary disease) (HCC)    DVT (deep venous thrombosis) (HCC) 07/13/2018   Right leg   Environmental allergies    fumes    Fibromyalgia    GERD (gastroesophageal reflux disease)    Headache    History of bronchitis    Hx of total knee arthroplasty 12/13/2015   Hyperlipidemia    Hypertension    Morbid obesity with BMI of 45.0-49.9, adult (HCC) 07/25/2015   NICM (nonischemic cardiomyopathy) (HCC)    a. ? PVC mediated;  b. 06/2016 Echo: EF 25-30%, mild LVH, mild LAE, mild AI/MR/TR/PR; c. 08/2016 Cath: nl cors, EF 25%.   Numbness    hands bilat when driving; improves when not preforming task    Osteoarthritis of left knee 08/02/2015   PE (pulmonary thromboembolism) (HCC) 07/13/2018   Pes anserinus bursitis of left knee 05/18/2016   Presence of right artificial knee joint 05/18/2016   PVC (premature ventricular contraction) 06/04/2017   PVC's (premature ventricular contractions)    a. 11/2016 Amio started;  b. 11/29/16 48h Holter: 57574 PVC's (41%); c. 12/2016 24h Holter: 18040 PVC's (15%); c. 02/2017 48h Holter: 37262 PVC's (41%).   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 Chronic pain syndrome   2. DDD (degenerative disc disease), lumbar   3. Chronic bilateral low back pain with bilateral sciatica   4. Hip pain, bilateral   5. Chronic, continuous use of opioids   6. Spinal stenosis of lumbar region with neurogenic claudication   7.  Temporal arteritis (HCC)   8. Chronic pain of right hip   9. Pain in joint of right shoulder   10. Osteoarthritis of left knee, unspecified osteoarthritis type   11. Primary osteoarthritis of right shoulder   We will see her for her scheduled in person visit on September 23.  She has medication at pharmacy to last until then.  I have reviewed the [facility] practitioner database information and it is appropriate.  Continue follow-up with the primary care physicians for baseline medical care. Follow Up Instructions:    I discussed the assessment and treatment plan with the patient. The patient was provided an opportunity  to ask questions and all were answered. The patient agreed with the plan and demonstrated an understanding of the instructions.   The patient was advised to call back or seek an in-person evaluation if the symptoms worsen or if the condition fails to improve as anticipated.  I provided 15 minutes of non-face-to-face time during this encounter.   Yevette Edwards, MD

## 2022-09-17 ENCOUNTER — Inpatient Hospital Stay: Payer: Medicare HMO | Attending: Oncology

## 2022-09-17 DIAGNOSIS — Z86711 Personal history of pulmonary embolism: Secondary | ICD-10-CM | POA: Diagnosis not present

## 2022-09-17 DIAGNOSIS — D649 Anemia, unspecified: Secondary | ICD-10-CM | POA: Insufficient documentation

## 2022-09-17 DIAGNOSIS — R5383 Other fatigue: Secondary | ICD-10-CM | POA: Diagnosis not present

## 2022-09-17 DIAGNOSIS — Z79899 Other long term (current) drug therapy: Secondary | ICD-10-CM | POA: Diagnosis not present

## 2022-09-17 LAB — BASIC METABOLIC PANEL
Anion gap: 8 (ref 5–15)
BUN: 21 mg/dL (ref 8–23)
CO2: 29 mmol/L (ref 22–32)
Calcium: 7.7 mg/dL — ABNORMAL LOW (ref 8.9–10.3)
Chloride: 97 mmol/L — ABNORMAL LOW (ref 98–111)
Creatinine, Ser: 0.98 mg/dL (ref 0.44–1.00)
GFR, Estimated: >60 mL/min (ref >60)
Glucose, Bld: 103 mg/dL — ABNORMAL HIGH (ref 70–99)
Potassium: 3.6 mmol/L (ref 3.5–5.1)
Sodium: 134 mmol/L — ABNORMAL LOW (ref 135–145)

## 2022-09-17 LAB — IRON AND TIBC: Iron: 52 ug/dL (ref 28–170)

## 2022-09-17 LAB — CBC
HCT: 29.4 % — ABNORMAL LOW (ref 36.0–46.0)
Hemoglobin: 9.5 g/dL — ABNORMAL LOW (ref 12.0–15.0)
MCH: 25.5 pg — ABNORMAL LOW (ref 26.0–34.0)
MCHC: 32.3 g/dL (ref 30.0–36.0)
MCV: 79 fL — ABNORMAL LOW (ref 80.0–100.0)
Platelets: 498 10*3/uL — ABNORMAL HIGH (ref 150–400)
RBC: 3.72 MIL/uL — ABNORMAL LOW (ref 3.87–5.11)
RDW: 16.6 % — ABNORMAL HIGH (ref 11.5–15.5)
WBC: 5.5 10*3/uL (ref 4.0–10.5)
nRBC: 0 % (ref 0.0–0.2)

## 2022-09-17 LAB — FERRITIN: Ferritin: 511 ng/mL — ABNORMAL HIGH (ref 11–307)

## 2022-09-25 DIAGNOSIS — Z9189 Other specified personal risk factors, not elsewhere classified: Secondary | ICD-10-CM | POA: Diagnosis not present

## 2022-09-25 DIAGNOSIS — R509 Fever, unspecified: Secondary | ICD-10-CM | POA: Diagnosis not present

## 2022-09-25 DIAGNOSIS — B379 Candidiasis, unspecified: Secondary | ICD-10-CM | POA: Diagnosis not present

## 2022-09-30 DIAGNOSIS — R509 Fever, unspecified: Secondary | ICD-10-CM | POA: Diagnosis not present

## 2022-10-06 ENCOUNTER — Encounter: Payer: Medicare HMO | Admitting: Anesthesiology

## 2022-10-06 DIAGNOSIS — Z86711 Personal history of pulmonary embolism: Secondary | ICD-10-CM | POA: Diagnosis not present

## 2022-10-06 DIAGNOSIS — I4519 Other right bundle-branch block: Secondary | ICD-10-CM | POA: Diagnosis not present

## 2022-10-06 DIAGNOSIS — G9340 Encephalopathy, unspecified: Secondary | ICD-10-CM | POA: Diagnosis not present

## 2022-10-06 DIAGNOSIS — I130 Hypertensive heart and chronic kidney disease with heart failure and stage 1 through stage 4 chronic kidney disease, or unspecified chronic kidney disease: Secondary | ICD-10-CM | POA: Diagnosis not present

## 2022-10-06 DIAGNOSIS — L89159 Pressure ulcer of sacral region, unspecified stage: Secondary | ICD-10-CM | POA: Diagnosis not present

## 2022-10-06 DIAGNOSIS — E8770 Fluid overload, unspecified: Secondary | ICD-10-CM | POA: Diagnosis not present

## 2022-10-06 DIAGNOSIS — R936 Abnormal findings on diagnostic imaging of limbs: Secondary | ICD-10-CM | POA: Diagnosis not present

## 2022-10-06 DIAGNOSIS — I959 Hypotension, unspecified: Secondary | ICD-10-CM | POA: Diagnosis not present

## 2022-10-06 DIAGNOSIS — G928 Other toxic encephalopathy: Secondary | ICD-10-CM | POA: Diagnosis not present

## 2022-10-06 DIAGNOSIS — R509 Fever, unspecified: Secondary | ICD-10-CM | POA: Diagnosis not present

## 2022-10-06 DIAGNOSIS — Z6841 Body Mass Index (BMI) 40.0 and over, adult: Secondary | ICD-10-CM | POA: Diagnosis not present

## 2022-10-06 DIAGNOSIS — R579 Shock, unspecified: Secondary | ICD-10-CM | POA: Diagnosis not present

## 2022-10-06 DIAGNOSIS — R4182 Altered mental status, unspecified: Secondary | ICD-10-CM | POA: Diagnosis not present

## 2022-10-06 DIAGNOSIS — I5023 Acute on chronic systolic (congestive) heart failure: Secondary | ICD-10-CM | POA: Diagnosis not present

## 2022-10-06 DIAGNOSIS — I700 Atherosclerosis of aorta: Secondary | ICD-10-CM | POA: Diagnosis not present

## 2022-10-06 DIAGNOSIS — I2489 Other forms of acute ischemic heart disease: Secondary | ICD-10-CM | POA: Diagnosis not present

## 2022-10-06 DIAGNOSIS — E273 Drug-induced adrenocortical insufficiency: Secondary | ICD-10-CM | POA: Diagnosis not present

## 2022-10-06 DIAGNOSIS — D649 Anemia, unspecified: Secondary | ICD-10-CM | POA: Diagnosis not present

## 2022-10-06 DIAGNOSIS — E43 Unspecified severe protein-calorie malnutrition: Secondary | ICD-10-CM | POA: Diagnosis not present

## 2022-10-06 DIAGNOSIS — B370 Candidal stomatitis: Secondary | ICD-10-CM | POA: Diagnosis not present

## 2022-10-06 DIAGNOSIS — R200 Anesthesia of skin: Secondary | ICD-10-CM | POA: Diagnosis not present

## 2022-10-06 DIAGNOSIS — I110 Hypertensive heart disease with heart failure: Secondary | ICD-10-CM | POA: Diagnosis not present

## 2022-10-06 DIAGNOSIS — I9589 Other hypotension: Secondary | ICD-10-CM | POA: Diagnosis not present

## 2022-10-06 DIAGNOSIS — R0989 Other specified symptoms and signs involving the circulatory and respiratory systems: Secondary | ICD-10-CM | POA: Diagnosis not present

## 2022-10-06 DIAGNOSIS — E271 Primary adrenocortical insufficiency: Secondary | ICD-10-CM | POA: Diagnosis not present

## 2022-10-06 DIAGNOSIS — R29898 Other symptoms and signs involving the musculoskeletal system: Secondary | ICD-10-CM | POA: Diagnosis not present

## 2022-10-06 DIAGNOSIS — N179 Acute kidney failure, unspecified: Secondary | ICD-10-CM | POA: Diagnosis not present

## 2022-10-06 DIAGNOSIS — N189 Chronic kidney disease, unspecified: Secondary | ICD-10-CM | POA: Diagnosis not present

## 2022-10-06 DIAGNOSIS — E46 Unspecified protein-calorie malnutrition: Secondary | ICD-10-CM | POA: Diagnosis not present

## 2022-10-06 DIAGNOSIS — N390 Urinary tract infection, site not specified: Secondary | ICD-10-CM | POA: Diagnosis not present

## 2022-10-06 DIAGNOSIS — I509 Heart failure, unspecified: Secondary | ICD-10-CM | POA: Diagnosis not present

## 2022-10-06 DIAGNOSIS — R945 Abnormal results of liver function studies: Secondary | ICD-10-CM | POA: Diagnosis not present

## 2022-10-06 DIAGNOSIS — R6884 Jaw pain: Secondary | ICD-10-CM | POA: Diagnosis not present

## 2022-10-06 DIAGNOSIS — R519 Headache, unspecified: Secondary | ICD-10-CM | POA: Diagnosis not present

## 2022-10-06 DIAGNOSIS — D6489 Other specified anemias: Secondary | ICD-10-CM | POA: Diagnosis not present

## 2022-10-06 DIAGNOSIS — I5020 Unspecified systolic (congestive) heart failure: Secondary | ICD-10-CM | POA: Diagnosis not present

## 2022-10-06 DIAGNOSIS — R609 Edema, unspecified: Secondary | ICD-10-CM | POA: Diagnosis not present

## 2022-10-06 DIAGNOSIS — M316 Other giant cell arteritis: Secondary | ICD-10-CM | POA: Diagnosis not present

## 2022-10-06 DIAGNOSIS — R7401 Elevation of levels of liver transaminase levels: Secondary | ICD-10-CM | POA: Diagnosis not present

## 2022-10-06 DIAGNOSIS — R531 Weakness: Secondary | ICD-10-CM | POA: Diagnosis not present

## 2022-10-06 DIAGNOSIS — J811 Chronic pulmonary edema: Secondary | ICD-10-CM | POA: Diagnosis not present

## 2022-10-06 DIAGNOSIS — E2749 Other adrenocortical insufficiency: Secondary | ICD-10-CM | POA: Diagnosis not present

## 2022-10-06 DIAGNOSIS — Z8679 Personal history of other diseases of the circulatory system: Secondary | ICD-10-CM | POA: Diagnosis not present

## 2022-10-07 DIAGNOSIS — R609 Edema, unspecified: Secondary | ICD-10-CM | POA: Diagnosis not present

## 2022-10-07 DIAGNOSIS — E8770 Fluid overload, unspecified: Secondary | ICD-10-CM | POA: Diagnosis not present

## 2022-10-07 DIAGNOSIS — I5020 Unspecified systolic (congestive) heart failure: Secondary | ICD-10-CM | POA: Diagnosis not present

## 2022-10-07 DIAGNOSIS — I959 Hypotension, unspecified: Secondary | ICD-10-CM | POA: Diagnosis not present

## 2022-10-07 DIAGNOSIS — R519 Headache, unspecified: Secondary | ICD-10-CM | POA: Diagnosis not present

## 2022-10-07 DIAGNOSIS — I4519 Other right bundle-branch block: Secondary | ICD-10-CM | POA: Diagnosis not present

## 2022-10-07 DIAGNOSIS — N179 Acute kidney failure, unspecified: Secondary | ICD-10-CM | POA: Diagnosis not present

## 2022-10-07 DIAGNOSIS — N390 Urinary tract infection, site not specified: Secondary | ICD-10-CM | POA: Diagnosis not present

## 2022-10-07 DIAGNOSIS — I2489 Other forms of acute ischemic heart disease: Secondary | ICD-10-CM | POA: Diagnosis not present

## 2022-10-07 DIAGNOSIS — D6489 Other specified anemias: Secondary | ICD-10-CM | POA: Diagnosis not present

## 2022-10-07 DIAGNOSIS — R4182 Altered mental status, unspecified: Secondary | ICD-10-CM | POA: Diagnosis not present

## 2022-10-07 DIAGNOSIS — R579 Shock, unspecified: Secondary | ICD-10-CM | POA: Diagnosis not present

## 2022-10-07 DIAGNOSIS — I700 Atherosclerosis of aorta: Secondary | ICD-10-CM | POA: Diagnosis not present

## 2022-10-07 DIAGNOSIS — R6884 Jaw pain: Secondary | ICD-10-CM | POA: Diagnosis not present

## 2022-10-08 DIAGNOSIS — E271 Primary adrenocortical insufficiency: Secondary | ICD-10-CM | POA: Diagnosis not present

## 2022-10-08 DIAGNOSIS — E46 Unspecified protein-calorie malnutrition: Secondary | ICD-10-CM | POA: Diagnosis not present

## 2022-10-08 DIAGNOSIS — G9340 Encephalopathy, unspecified: Secondary | ICD-10-CM | POA: Diagnosis not present

## 2022-10-08 DIAGNOSIS — N179 Acute kidney failure, unspecified: Secondary | ICD-10-CM | POA: Diagnosis not present

## 2022-10-08 DIAGNOSIS — I2489 Other forms of acute ischemic heart disease: Secondary | ICD-10-CM | POA: Diagnosis not present

## 2022-10-08 DIAGNOSIS — D649 Anemia, unspecified: Secondary | ICD-10-CM | POA: Diagnosis not present

## 2022-10-08 DIAGNOSIS — B370 Candidal stomatitis: Secondary | ICD-10-CM | POA: Diagnosis not present

## 2022-10-08 DIAGNOSIS — M316 Other giant cell arteritis: Secondary | ICD-10-CM | POA: Diagnosis not present

## 2022-10-08 DIAGNOSIS — N390 Urinary tract infection, site not specified: Secondary | ICD-10-CM | POA: Diagnosis not present

## 2022-10-08 DIAGNOSIS — I959 Hypotension, unspecified: Secondary | ICD-10-CM | POA: Diagnosis not present

## 2022-10-08 DIAGNOSIS — I5020 Unspecified systolic (congestive) heart failure: Secondary | ICD-10-CM | POA: Diagnosis not present

## 2022-10-09 DIAGNOSIS — N390 Urinary tract infection, site not specified: Secondary | ICD-10-CM | POA: Diagnosis not present

## 2022-10-09 DIAGNOSIS — I5020 Unspecified systolic (congestive) heart failure: Secondary | ICD-10-CM | POA: Diagnosis not present

## 2022-10-09 DIAGNOSIS — L89159 Pressure ulcer of sacral region, unspecified stage: Secondary | ICD-10-CM | POA: Diagnosis not present

## 2022-10-09 DIAGNOSIS — B370 Candidal stomatitis: Secondary | ICD-10-CM | POA: Diagnosis not present

## 2022-10-09 DIAGNOSIS — G9340 Encephalopathy, unspecified: Secondary | ICD-10-CM | POA: Diagnosis not present

## 2022-10-09 DIAGNOSIS — N179 Acute kidney failure, unspecified: Secondary | ICD-10-CM | POA: Diagnosis not present

## 2022-10-09 DIAGNOSIS — M316 Other giant cell arteritis: Secondary | ICD-10-CM | POA: Diagnosis not present

## 2022-10-09 DIAGNOSIS — I959 Hypotension, unspecified: Secondary | ICD-10-CM | POA: Diagnosis not present

## 2022-10-09 DIAGNOSIS — E46 Unspecified protein-calorie malnutrition: Secondary | ICD-10-CM | POA: Diagnosis not present

## 2022-10-09 DIAGNOSIS — I2489 Other forms of acute ischemic heart disease: Secondary | ICD-10-CM | POA: Diagnosis not present

## 2022-10-10 DIAGNOSIS — R945 Abnormal results of liver function studies: Secondary | ICD-10-CM | POA: Diagnosis not present

## 2022-10-10 DIAGNOSIS — E440 Moderate protein-calorie malnutrition: Secondary | ICD-10-CM | POA: Diagnosis present

## 2022-10-10 DIAGNOSIS — N189 Chronic kidney disease, unspecified: Secondary | ICD-10-CM | POA: Diagnosis not present

## 2022-10-10 DIAGNOSIS — R200 Anesthesia of skin: Secondary | ICD-10-CM | POA: Diagnosis not present

## 2022-10-10 DIAGNOSIS — R531 Weakness: Secondary | ICD-10-CM | POA: Diagnosis not present

## 2022-10-10 DIAGNOSIS — I9589 Other hypotension: Secondary | ICD-10-CM | POA: Diagnosis not present

## 2022-10-10 DIAGNOSIS — E43 Unspecified severe protein-calorie malnutrition: Secondary | ICD-10-CM | POA: Diagnosis present

## 2022-10-11 DIAGNOSIS — Z86711 Personal history of pulmonary embolism: Secondary | ICD-10-CM | POA: Diagnosis not present

## 2022-10-11 DIAGNOSIS — I509 Heart failure, unspecified: Secondary | ICD-10-CM | POA: Diagnosis not present

## 2022-10-11 DIAGNOSIS — E2749 Other adrenocortical insufficiency: Secondary | ICD-10-CM | POA: Diagnosis not present

## 2022-10-11 DIAGNOSIS — R7401 Elevation of levels of liver transaminase levels: Secondary | ICD-10-CM | POA: Diagnosis not present

## 2022-10-11 DIAGNOSIS — I9589 Other hypotension: Secondary | ICD-10-CM | POA: Diagnosis not present

## 2022-10-12 DIAGNOSIS — R519 Headache, unspecified: Secondary | ICD-10-CM | POA: Diagnosis not present

## 2022-10-12 DIAGNOSIS — R531 Weakness: Secondary | ICD-10-CM | POA: Diagnosis not present

## 2022-10-12 DIAGNOSIS — N189 Chronic kidney disease, unspecified: Secondary | ICD-10-CM | POA: Diagnosis not present

## 2022-10-12 DIAGNOSIS — I959 Hypotension, unspecified: Secondary | ICD-10-CM | POA: Diagnosis not present

## 2022-10-12 DIAGNOSIS — R200 Anesthesia of skin: Secondary | ICD-10-CM | POA: Diagnosis not present

## 2022-10-12 DIAGNOSIS — E2749 Other adrenocortical insufficiency: Secondary | ICD-10-CM | POA: Diagnosis not present

## 2022-10-12 DIAGNOSIS — Z8679 Personal history of other diseases of the circulatory system: Secondary | ICD-10-CM | POA: Diagnosis not present

## 2022-10-12 DIAGNOSIS — R936 Abnormal findings on diagnostic imaging of limbs: Secondary | ICD-10-CM | POA: Diagnosis not present

## 2022-10-12 DIAGNOSIS — R29898 Other symptoms and signs involving the musculoskeletal system: Secondary | ICD-10-CM | POA: Diagnosis not present

## 2022-10-20 DIAGNOSIS — E2749 Other adrenocortical insufficiency: Secondary | ICD-10-CM | POA: Diagnosis present

## 2022-12-03 ENCOUNTER — Telehealth: Payer: Self-pay | Admitting: Anesthesiology

## 2022-12-03 NOTE — Telephone Encounter (Signed)
PT sister called stated that patient has been in hospital and once she left patient was send to rehab. PT will like to see if she can get a vv appt for her medication. PT is unable to come out at this time, she is home and have a nurse that comes out once a week. PT is running low on medication. Please give patient a call. TY

## 2022-12-03 NOTE — Telephone Encounter (Signed)
Yes, may do VV

## 2022-12-09 ENCOUNTER — Ambulatory Visit: Payer: Medicare Other | Attending: Anesthesiology | Admitting: Anesthesiology

## 2022-12-09 ENCOUNTER — Encounter: Payer: Self-pay | Admitting: Anesthesiology

## 2022-12-09 DIAGNOSIS — M5442 Lumbago with sciatica, left side: Secondary | ICD-10-CM

## 2022-12-09 DIAGNOSIS — G894 Chronic pain syndrome: Secondary | ICD-10-CM

## 2022-12-09 DIAGNOSIS — G8929 Other chronic pain: Secondary | ICD-10-CM

## 2022-12-09 DIAGNOSIS — M25551 Pain in right hip: Secondary | ICD-10-CM

## 2022-12-09 DIAGNOSIS — M48062 Spinal stenosis, lumbar region with neurogenic claudication: Secondary | ICD-10-CM | POA: Diagnosis not present

## 2022-12-09 DIAGNOSIS — Z79891 Long term (current) use of opiate analgesic: Secondary | ICD-10-CM

## 2022-12-09 DIAGNOSIS — F1190 Opioid use, unspecified, uncomplicated: Secondary | ICD-10-CM

## 2022-12-09 DIAGNOSIS — M25511 Pain in right shoulder: Secondary | ICD-10-CM

## 2022-12-09 DIAGNOSIS — M5441 Lumbago with sciatica, right side: Secondary | ICD-10-CM | POA: Diagnosis not present

## 2022-12-09 DIAGNOSIS — M25552 Pain in left hip: Secondary | ICD-10-CM

## 2022-12-09 DIAGNOSIS — I5032 Chronic diastolic (congestive) heart failure: Secondary | ICD-10-CM

## 2022-12-09 DIAGNOSIS — M316 Other giant cell arteritis: Secondary | ICD-10-CM

## 2022-12-09 DIAGNOSIS — M1712 Unilateral primary osteoarthritis, left knee: Secondary | ICD-10-CM

## 2022-12-09 DIAGNOSIS — M19011 Primary osteoarthritis, right shoulder: Secondary | ICD-10-CM

## 2022-12-09 MED ORDER — GABAPENTIN 300 MG PO CAPS: 300.0000 mg | ORAL_CAPSULE | Freq: Two times a day (BID) | ORAL | 3 refills | Status: DC

## 2022-12-09 MED ORDER — OXYCODONE-ACETAMINOPHEN 10-325 MG PO TABS: 1.0000 | ORAL_TABLET | ORAL | 0 refills | Status: AC | PRN

## 2022-12-15 ENCOUNTER — Telehealth: Payer: Medicare HMO | Admitting: Anesthesiology

## 2022-12-15 NOTE — Progress Notes (Signed)
Virtual Visit via Telephone Note  I connected with Wendy Sanchez on 12/15/22 at 10:40 AM EST by telephone and verified that I am speaking with the correct person using two identifiers.  Location: Patient: Home Provider: Pain control center   I discussed the limitations, risks, security and privacy concerns of performing an evaluation and management service by telephone and the availability of in person appointments. I also discussed with the patient that there may be a patient responsible charge related to this service. The patient expressed understanding and agreed to proceed.   History of Present Illness: I spoke with Wendy Sanchez via telephone for her conference.  She reports that she has been doing reasonably well in regards to her pain management with her medication management.  She has had a very complicated history outside of chronic pain with her heart lungs and kidney function.  She has continued to struggle with weight gain as well.  Unfortunately she has required chronic opioid therapy in addition to all the above complicating factors but the medications have worked well for her and keeping her pain under control.  Otherwise she maintains that she is quite miserable and completely unable to be active at all.  Conservative management did not work for her and she has not been able to take injection therapy.  Otherwise she is recovering from recent problems with her both heart and kidneys.  She takes her medications as prescribed without side effect.  Continues to get approximately 50 to 75% relief with these.  She requires Percocet about every 4-6 hours on average.  The quality characteristic and distribution of her pain has been stable.  Review of systems: General: No fevers or chills Pulmonary: No shortness of breath or dyspnea Cardiac: No angina or palpitations or lightheadedness GI: No abdominal pain or constipation Psych: No depression    Observations/Objective:  Current Outpatient  Medications:    gabapentin (NEURONTIN) 300 MG capsule, Take 1 capsule (300 mg total) by mouth 2 (two) times daily., Disp: 60 capsule, Rfl: 3   oxyCODONE-acetaminophen (PERCOCET) 10-325 MG tablet, Take 1 tablet by mouth every 4 (four) hours as needed for pain., Disp: 150 tablet, Rfl: 0   [START ON 01/08/2023] oxyCODONE-acetaminophen (PERCOCET) 10-325 MG tablet, Take 1 tablet by mouth every 4 (four) hours as needed for pain., Disp: 150 tablet, Rfl: 0   acetaminophen (TYLENOL) 325 MG tablet, Take 2 tablets (650 mg total) by mouth every 6 (six) hours as needed for mild pain or fever., Disp: 60 tablet, Rfl: 0   amoxicillin (AMOXIL) 500 MG capsule, Take 500 mg by mouth 4 (four) times daily. For salmonella (Patient not taking: Reported on 07/01/2022), Disp: , Rfl:    apixaban (ELIQUIS) 5 MG TABS tablet, Take 1 tablet (5 mg total) by mouth 2 (two) times daily., Disp: 60 tablet, Rfl: 0   calcium carbonate (OS-CAL - DOSED IN MG OF ELEMENTAL CALCIUM) 1250 (500 Ca) MG tablet, Take 2 tablets by mouth in the morning., Disp: , Rfl:    diphenhydrAMINE (BENADRYL) 25 mg capsule, Take 25 mg by mouth every 6 (six) hours as needed for itching or allergies., Disp: , Rfl:    docusate sodium (COLACE) 100 MG capsule, Take 200 mg by mouth at bedtime as needed for mild constipation., Disp: , Rfl:    ferrous sulfate 325 (65 FE) MG tablet, Take 325 mg by mouth 2 (two) times daily with a meal., Disp: , Rfl:    fluconazole (DIFLUCAN) 100 MG tablet, Take 100 mg by mouth  daily., Disp: , Rfl:    gabapentin (NEURONTIN) 600 MG tablet, Take 1 tablet (600 mg total) by mouth 3 (three) times daily., Disp: 90 tablet, Rfl: 3   ipratropium-albuterol (DUONEB) 0.5-2.5 (3) MG/3ML SOLN, Take 3 mLs by nebulization every 6 (six) hours as needed (shortness of breath)., Disp: , Rfl:    levocetirizine (XYZAL) 5 MG tablet, Take 10 mg by mouth every evening., Disp: , Rfl:    nortriptyline (PAMELOR) 50 MG capsule, Take 50 mg by mouth at bedtime., Disp: ,  Rfl:    nystatin (MYCOSTATIN) 100000 UNIT/ML suspension, Take 5 mLs by mouth 4 (four) times daily., Disp: , Rfl:    nystatin cream (MYCOSTATIN), Apply 1 application topically 2 (two) times daily. (Apply under skin folds), Disp: , Rfl:    ondansetron (ZOFRAN) 4 MG tablet, Take 1 tablet (4 mg total) by mouth every 6 (six) hours as needed for nausea. (Patient not taking: Reported on 03/17/2022), Disp: 20 tablet, Rfl: 0   pantoprazole (PROTONIX) 40 MG tablet, Take 40 mg by mouth daily., Disp: , Rfl:    polyethylene glycol (MIRALAX / GLYCOLAX) 17 g packet, Take 17 g by mouth daily as needed for moderate constipation., Disp: 14 each, Rfl: 0   potassium chloride SA (KLOR-CON M) 20 MEQ tablet, Take 20 mEq by mouth 2 (two) times daily., Disp: , Rfl:    predniSONE (DELTASONE) 10 MG tablet, Take 1 mg by mouth in the morning., Disp: , Rfl:    torsemide (DEMADEX) 10 MG tablet, Take 10 mg by mouth daily., Disp: , Rfl:   Past Medical History:  Diagnosis Date   Allergic rhinitis    Arthritis    Asthma    problems with fumes and aerosols cause asthma   Chest pain 10/19/2016   CHF (congestive heart failure) (HCC)    Complication of anesthesia    awakens during surgery; has occurred with last 3-4 surgeries    COPD (chronic obstructive pulmonary disease) (HCC)    DVT (deep venous thrombosis) (HCC) 07/13/2018   Right leg   Environmental allergies    fumes    Fibromyalgia    GERD (gastroesophageal reflux disease)    Headache    History of bronchitis    Hx of total knee arthroplasty 12/13/2015   Hyperlipidemia    Hypertension    Morbid obesity with BMI of 45.0-49.9, adult (HCC) 07/25/2015   NICM (nonischemic cardiomyopathy) (HCC)    a. ? PVC mediated;  b. 06/2016 Echo: EF 25-30%, mild LVH, mild LAE, mild AI/MR/TR/PR; c. 08/2016 Cath: nl cors, EF 25%.   Numbness    hands bilat when driving; improves when not preforming task    Osteoarthritis of left knee 08/02/2015   PE (pulmonary thromboembolism) (HCC)  07/13/2018   Pes anserinus bursitis of left knee 05/18/2016   Presence of right artificial knee joint 05/18/2016   PVC (premature ventricular contraction) 06/04/2017   PVC's (premature ventricular contractions)    a. 11/2016 Amio started;  b. 11/29/16 48h Holter: 57574 PVC's (41%); c. 12/2016 24h Holter: 18040 PVC's (15%); c. 02/2017 48h Holter: 37262 PVC's (41%).   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 Chronic pain syndrome   2. Chronic bilateral low back pain with bilateral sciatica   3. Spinal stenosis of lumbar region with neurogenic claudication   4. Chronic, continuous use of opioids   5. Hip pain, bilateral   6. Temporal arteritis (HCC)   7. Chronic pain of right hip   8. Chronic diastolic CHF (congestive heart failure) (HCC)   9. Primary osteoarthritis of right shoulder   10. Osteoarthritis of left knee, unspecified osteoarthritis type   11. Pain in joint of right shoulder    Based on our conversation it appears that she is doing somewhat better following her recent medical illness.  She is taking her medications for chronic pain as prescribed and these continue to work effectively for her and are without side effect.  I have reviewed the [facility] practitioner database information and it is appropriate for refill.  Fortunately she does get good relief from the medication considering the complexity of her current underlying medical status.  No other changes in her regimen will be initiated.  Have encouraged her to continue follow-up with her primary care physician for baseline medical care with return to clinic scheduled in 2 months.  Follow Up Instructions:     I discussed the assessment and treatment plan with the patient. The patient was provided an opportunity to ask questions and all were answered. The patient agreed with the plan and demonstrated an understanding of the instructions.   The patient was advised to call back or seek an in-person evaluation if the symptoms worsen or if the condition fails to improve as anticipated.  I provided 30 minutes of non-face-to-face time during this encounter.   Yevette Edwards, MD

## 2023-01-29 ENCOUNTER — Emergency Department: Payer: Medicare Other

## 2023-01-29 ENCOUNTER — Other Ambulatory Visit: Payer: Self-pay

## 2023-01-29 ENCOUNTER — Inpatient Hospital Stay
Admission: EM | Admit: 2023-01-29 | Discharge: 2023-02-04 | DRG: 314 | Disposition: A | Discharge status: Skilled Nursing Facility | Payer: Medicare Other | Source: Skilled Nursing Facility | Attending: Internal Medicine | Admitting: Internal Medicine

## 2023-01-29 DIAGNOSIS — D849 Immunodeficiency, unspecified: Secondary | ICD-10-CM | POA: Diagnosis present

## 2023-01-29 DIAGNOSIS — Z6841 Body Mass Index (BMI) 40.0 and over, adult: Secondary | ICD-10-CM

## 2023-01-29 DIAGNOSIS — Z86718 Personal history of other venous thrombosis and embolism: Secondary | ICD-10-CM

## 2023-01-29 DIAGNOSIS — F1190 Opioid use, unspecified, uncomplicated: Secondary | ICD-10-CM | POA: Diagnosis present

## 2023-01-29 DIAGNOSIS — Z933 Colostomy status: Secondary | ICD-10-CM

## 2023-01-29 DIAGNOSIS — Z7983 Long term (current) use of bisphosphonates: Secondary | ICD-10-CM

## 2023-01-29 DIAGNOSIS — M316 Other giant cell arteritis: Secondary | ICD-10-CM | POA: Diagnosis present

## 2023-01-29 DIAGNOSIS — G894 Chronic pain syndrome: Secondary | ICD-10-CM | POA: Diagnosis present

## 2023-01-29 DIAGNOSIS — Z96653 Presence of artificial knee joint, bilateral: Secondary | ICD-10-CM | POA: Diagnosis present

## 2023-01-29 DIAGNOSIS — Z7952 Long term (current) use of systemic steroids: Secondary | ICD-10-CM

## 2023-01-29 DIAGNOSIS — N390 Urinary tract infection, site not specified: Secondary | ICD-10-CM

## 2023-01-29 DIAGNOSIS — I428 Other cardiomyopathies: Secondary | ICD-10-CM | POA: Diagnosis present

## 2023-01-29 DIAGNOSIS — E2749 Other adrenocortical insufficiency: Secondary | ICD-10-CM | POA: Diagnosis present

## 2023-01-29 DIAGNOSIS — Z833 Family history of diabetes mellitus: Secondary | ICD-10-CM

## 2023-01-29 DIAGNOSIS — A4151 Sepsis due to Escherichia coli [E. coli]: Secondary | ICD-10-CM

## 2023-01-29 DIAGNOSIS — I110 Hypertensive heart disease with heart failure: Secondary | ICD-10-CM | POA: Diagnosis present

## 2023-01-29 DIAGNOSIS — E8809 Other disorders of plasma-protein metabolism, not elsewhere classified: Secondary | ICD-10-CM | POA: Diagnosis present

## 2023-01-29 DIAGNOSIS — Z91010 Allergy to peanuts: Secondary | ICD-10-CM

## 2023-01-29 DIAGNOSIS — G629 Polyneuropathy, unspecified: Secondary | ICD-10-CM

## 2023-01-29 DIAGNOSIS — H6093 Unspecified otitis externa, bilateral: Secondary | ICD-10-CM | POA: Diagnosis present

## 2023-01-29 DIAGNOSIS — Z885 Allergy status to narcotic agent status: Secondary | ICD-10-CM

## 2023-01-29 DIAGNOSIS — J4489 Other specified chronic obstructive pulmonary disease: Secondary | ICD-10-CM | POA: Diagnosis present

## 2023-01-29 DIAGNOSIS — R7881 Bacteremia: Principal | ICD-10-CM

## 2023-01-29 DIAGNOSIS — Z8249 Family history of ischemic heart disease and other diseases of the circulatory system: Secondary | ICD-10-CM

## 2023-01-29 DIAGNOSIS — I959 Hypotension, unspecified: Secondary | ICD-10-CM | POA: Diagnosis not present

## 2023-01-29 DIAGNOSIS — Z803 Family history of malignant neoplasm of breast: Secondary | ICD-10-CM

## 2023-01-29 DIAGNOSIS — M797 Fibromyalgia: Secondary | ICD-10-CM | POA: Diagnosis present

## 2023-01-29 DIAGNOSIS — Z91048 Other nonmedicinal substance allergy status: Secondary | ICD-10-CM

## 2023-01-29 DIAGNOSIS — Z86711 Personal history of pulmonary embolism: Secondary | ICD-10-CM | POA: Diagnosis present

## 2023-01-29 DIAGNOSIS — J449 Chronic obstructive pulmonary disease, unspecified: Secondary | ICD-10-CM | POA: Insufficient documentation

## 2023-01-29 DIAGNOSIS — I5022 Chronic systolic (congestive) heart failure: Secondary | ICD-10-CM | POA: Diagnosis present

## 2023-01-29 DIAGNOSIS — J029 Acute pharyngitis, unspecified: Secondary | ICD-10-CM | POA: Diagnosis present

## 2023-01-29 DIAGNOSIS — Z888 Allergy status to other drugs, medicaments and biological substances status: Secondary | ICD-10-CM

## 2023-01-29 DIAGNOSIS — Z832 Family history of diseases of the blood and blood-forming organs and certain disorders involving the immune mechanism: Secondary | ICD-10-CM

## 2023-01-29 DIAGNOSIS — E440 Moderate protein-calorie malnutrition: Secondary | ICD-10-CM | POA: Diagnosis present

## 2023-01-29 DIAGNOSIS — Z91013 Allergy to seafood: Secondary | ICD-10-CM

## 2023-01-29 DIAGNOSIS — Z9049 Acquired absence of other specified parts of digestive tract: Secondary | ICD-10-CM

## 2023-01-29 DIAGNOSIS — Z1152 Encounter for screening for COVID-19: Secondary | ICD-10-CM

## 2023-01-29 DIAGNOSIS — N898 Other specified noninflammatory disorders of vagina: Secondary | ICD-10-CM | POA: Diagnosis present

## 2023-01-29 DIAGNOSIS — I5020 Unspecified systolic (congestive) heart failure: Secondary | ICD-10-CM | POA: Diagnosis present

## 2023-01-29 DIAGNOSIS — H6090 Unspecified otitis externa, unspecified ear: Secondary | ICD-10-CM

## 2023-01-29 DIAGNOSIS — Z820 Family history of epilepsy and other diseases of the nervous system: Secondary | ICD-10-CM

## 2023-01-29 DIAGNOSIS — Z9884 Bariatric surgery status: Secondary | ICD-10-CM

## 2023-01-29 DIAGNOSIS — Z87440 Personal history of urinary (tract) infections: Secondary | ICD-10-CM

## 2023-01-29 DIAGNOSIS — Z85528 Personal history of other malignant neoplasm of kidney: Secondary | ICD-10-CM

## 2023-01-29 DIAGNOSIS — Z91041 Radiographic dye allergy status: Secondary | ICD-10-CM

## 2023-01-29 DIAGNOSIS — E785 Hyperlipidemia, unspecified: Secondary | ICD-10-CM | POA: Diagnosis present

## 2023-01-29 DIAGNOSIS — Z79818 Long term (current) use of other agents affecting estrogen receptors and estrogen levels: Secondary | ICD-10-CM

## 2023-01-29 DIAGNOSIS — Z79891 Long term (current) use of opiate analgesic: Secondary | ICD-10-CM

## 2023-01-29 DIAGNOSIS — K219 Gastro-esophageal reflux disease without esophagitis: Secondary | ICD-10-CM | POA: Diagnosis present

## 2023-01-29 DIAGNOSIS — Z7901 Long term (current) use of anticoagulants: Secondary | ICD-10-CM

## 2023-01-29 DIAGNOSIS — E43 Unspecified severe protein-calorie malnutrition: Secondary | ICD-10-CM | POA: Diagnosis present

## 2023-01-29 DIAGNOSIS — A419 Sepsis, unspecified organism: Secondary | ICD-10-CM | POA: Diagnosis present

## 2023-01-29 DIAGNOSIS — Z79899 Other long term (current) drug therapy: Secondary | ICD-10-CM

## 2023-01-29 DIAGNOSIS — G4730 Sleep apnea, unspecified: Secondary | ICD-10-CM | POA: Diagnosis present

## 2023-01-29 DIAGNOSIS — Z720 Tobacco use: Secondary | ICD-10-CM

## 2023-01-29 DIAGNOSIS — T380X5A Adverse effect of glucocorticoids and synthetic analogues, initial encounter: Secondary | ICD-10-CM | POA: Diagnosis present

## 2023-01-29 LAB — CBC WITH DIFFERENTIAL/PLATELET
Abs Immature Granulocytes: 0.22 10*3/uL — ABNORMAL HIGH (ref 0.00–0.07)
Basophils Absolute: 0.1 10*3/uL (ref 0.0–0.1)
Basophils Relative: 0 %
Eosinophils Absolute: 0.7 10*3/uL — ABNORMAL HIGH (ref 0.0–0.5)
Eosinophils Relative: 3 %
HCT: 29.1 % — ABNORMAL LOW (ref 36.0–46.0)
Hemoglobin: 9.3 g/dL — ABNORMAL LOW (ref 12.0–15.0)
Immature Granulocytes: 1 %
Lymphocytes Relative: 5 %
Lymphs Abs: 1 10*3/uL (ref 0.7–4.0)
MCH: 28.1 pg (ref 26.0–34.0)
MCHC: 32 g/dL (ref 30.0–36.0)
MCV: 87.9 fL (ref 80.0–100.0)
Monocytes Absolute: 0.4 10*3/uL (ref 0.1–1.0)
Monocytes Relative: 2 %
Neutro Abs: 18.4 10*3/uL — ABNORMAL HIGH (ref 1.7–7.7)
Neutrophils Relative %: 89 %
Platelets: 633 10*3/uL — ABNORMAL HIGH (ref 150–400)
RBC: 3.31 MIL/uL — ABNORMAL LOW (ref 3.87–5.11)
RDW: 16.5 % — ABNORMAL HIGH (ref 11.5–15.5)
WBC: 20.7 10*3/uL — ABNORMAL HIGH (ref 4.0–10.5)
nRBC: 0 % (ref 0.0–0.2)

## 2023-01-29 LAB — URINALYSIS, W/ REFLEX TO CULTURE (INFECTION SUSPECTED)
Bacteria, UA: NONE SEEN
Bilirubin Urine: NEGATIVE
Glucose, UA: NEGATIVE mg/dL
Hgb urine dipstick: NEGATIVE
Ketones, ur: NEGATIVE mg/dL
Nitrite: NEGATIVE
Protein, ur: 30 mg/dL — AB
Specific Gravity, Urine: 1.024 (ref 1.005–1.030)
pH: 5 (ref 5.0–8.0)

## 2023-01-29 LAB — COMPREHENSIVE METABOLIC PANEL
ALT: 23 U/L (ref 0–44)
AST: 33 U/L (ref 15–41)
Albumin: <1.5 g/dL — ABNORMAL LOW (ref 3.5–5.0)
Alkaline Phosphatase: 135 U/L — ABNORMAL HIGH (ref 38–126)
Anion gap: 8 (ref 5–15)
BUN: 17 mg/dL (ref 8–23)
CO2: 20 mmol/L — ABNORMAL LOW (ref 22–32)
Calcium: 7.9 mg/dL — ABNORMAL LOW (ref 8.9–10.3)
Chloride: 109 mmol/L (ref 98–111)
Creatinine, Ser: 0.72 mg/dL (ref 0.44–1.00)
GFR, Estimated: >60 mL/min (ref >60)
Glucose, Bld: 104 mg/dL — ABNORMAL HIGH (ref 70–99)
Potassium: 4.5 mmol/L (ref 3.5–5.1)
Sodium: 137 mmol/L (ref 135–145)
Total Bilirubin: 0.8 mg/dL (ref 0.0–1.2)
Total Protein: 5 g/dL — ABNORMAL LOW (ref 6.5–8.1)

## 2023-01-29 LAB — LACTIC ACID, PLASMA: Lactic Acid, Venous: 1.2 mmol/L (ref 0.5–1.9)

## 2023-01-29 MED ORDER — ACETAMINOPHEN 500 MG PO TABS
1000.0000 mg | ORAL_TABLET | ORAL | Status: AC
Start: 2023-01-29 — End: 2023-01-29
  Administered 2023-01-29: 1000 mg via ORAL
  Filled 2023-01-29: qty 2

## 2023-01-29 MED ORDER — VANCOMYCIN HCL IN DEXTROSE 1-5 GM/200ML-% IV SOLN
1000.0000 mg | Freq: Once | INTRAVENOUS | Status: AC
  Administered 2023-01-29: 1000 mg via INTRAVENOUS
  Filled 2023-01-29: qty 200

## 2023-01-29 MED ORDER — SODIUM CHLORIDE 0.9 % IV SOLN
2.0000 g | Freq: Once | INTRAVENOUS | Status: AC
  Administered 2023-01-29: 2 g via INTRAVENOUS
  Filled 2023-01-29: qty 12.5

## 2023-01-29 MED ORDER — LACTATED RINGERS IV BOLUS
1000.0000 mL | Freq: Once | INTRAVENOUS | Status: AC
  Administered 2023-01-29: 1000 mL via INTRAVENOUS

## 2023-01-29 MED ORDER — LACTATED RINGERS IV SOLN: INTRAVENOUS | Status: AC

## 2023-01-29 NOTE — ED Provider Notes (Signed)
[facility] Provider Note    Event Date/Time   First MD Initiated Contact with Patient 01/29/23 2308     (approximate)   History   Code Sepsis   HPI  Wendy Sanchez is a 73 y.o. female was recently hospitalized with cystitis and pneumonia   Recently left [facility] hospitals.  Started noticed that she was having a slightly sore throat, slight cough, and fever again.  Sent over for concerns of ongoing sepsis  No nausea no vomiting no abdominal pain.  Has an ostomy has been draining normally.  No rashes.  No headache  Physical Exam   Triage Vital Signs: ED Triage Vitals  Encounter Vitals Group     BP 01/29/23 2210 102/76     Systolic BP Percentile --      Diastolic BP Percentile --      Pulse Rate 01/29/23 2210 (!) 125     Resp 01/29/23 2210 (!) 30     Temp 01/29/23 2210 99.9 F (37.7 C)     Temp Source 01/29/23 2210 Oral     SpO2 01/29/23 2210 99 %     Weight --      Height --      Head Circumference --      Peak Flow --      Pain Score 01/29/23 2206 0     Pain Loc --      Pain Education --      Exclude from Growth Chart --     Most recent vital signs: Vitals:   01/29/23 2220 01/29/23 2300  BP: 108/76 116/70  Pulse: (!) 123 (!) 116  Resp: (!) 23 (!) 25  Temp:    SpO2: 100% 100%     General: Awake, no distress.  CV:  Good peripheral perfusion.  Slight tachycardia normal tones Resp:  Normal effort.  Clear bilateral.  Occasional slight dry cough Abd:  No distention.  Soft nontender nondistended throughout.  Ostomy with normal brown output, protuberant Other:  No noted rashes.   ED Results / Procedures / Treatments   Labs (all labs ordered are listed, but only abnormal results are displayed) Labs Reviewed  COMPREHENSIVE METABOLIC PANEL - Abnormal; Notable for the following components:      Result Value   CO2 20 (*)    Glucose, Bld 104 (*)    Calcium 7.9 (*)    Total Protein 5.0 (*)    Albumin <1.5 (*)    Alkaline Phosphatase  135 (*)    All other components within normal limits  CBC WITH DIFFERENTIAL/PLATELET - Abnormal; Notable for the following components:   WBC 20.7 (*)    RBC 3.31 (*)    Hemoglobin 9.3 (*)    HCT 29.1 (*)    RDW 16.5 (*)    Platelets 633 (*)    Neutro Abs 18.4 (*)    Eosinophils Absolute 0.7 (*)    Abs Immature Granulocytes 0.22 (*)    All other components within normal limits  URINALYSIS, W/ REFLEX TO CULTURE (INFECTION SUSPECTED) - Abnormal; Notable for the following components:   Color, Urine AMBER (*)    APPearance HAZY (*)    Protein, ur 30 (*)    Leukocytes,Ua TRACE (*)    All other components within normal limits  RESP PANEL BY RT-PCR (RSV, FLU A&B, COVID)  [id number]  CULTURE, BLOOD (ROUTINE X 2)  CULTURE, BLOOD (ROUTINE X 2)  URINE CULTURE  LACTIC ACID, PLASMA   Labs interpreted as  notable leukocytosis  Significant left shift.  Mild anemia  Chest x-ray interpreted by me as negative for acute finding     PROCEDURES:  Critical Care performed: Yes, see critical care procedure note(s) CRITICAL CARE Performed by: Sharyn Creamer   Total critical care time: 25 minutes  Critical care time was exclusive of separately billable procedures and treating other patients.  Critical care was necessary to treat or prevent imminent or life-threatening deterioration.  Critical care was time spent personally by me on the following activities: development of treatment plan with patient and/or surrogate as well as nursing, discussions with consultants, evaluation of patient's response to treatment, examination of patient, obtaining history from patient or surrogate, ordering and performing treatments and interventions, ordering and review of laboratory studies, ordering and review of radiographic studies, pulse oximetry and re-evaluation of patient's condition.  Procedures   MEDICATIONS ORDERED IN ED: Medications  lactated ringers infusion ( Intravenous New Bag/Given 01/29/23 2333)   lactated ringers bolus 1,000 mL (0 mLs Intravenous Stopped 01/29/23 2331)  ceFEPIme (MAXIPIME) 2 g in sodium chloride 0.9 % 100 mL IVPB (0 g Intravenous Stopped 01/29/23 2324)  vancomycin (VANCOCIN) IVPB 1000 mg/200 mL premix (0 mg Intravenous Stopped 01/30/23 0002)  acetaminophen (TYLENOL) tablet 1,000 mg (1,000 mg Oral Given 01/29/23 2334)     IMPRESSION / MDM / ASSESSMENT AND PLAN / ED COURSE  I reviewed the triage vital signs and the nursing notes.                              Differential diagnosis includes, but is not limited to, possible recurrent infection sepsis, bacteremia etc.  She does not have any acute symptoms to suggest drug mediated causation of fever, no chest pain no signs of hypoxia no symptoms would be highly suggestive of DVT PE or wound healing as cause.  Of note her labs show elevated Fasching count and she has a fever.  This in the setting of her elevated heart rate and mild hypotension with EMS are concerning for recurrent sepsis.  Her lungs are clear today.  I am concerned about a high risk for bacteremia though given her recent clinical history.  Broad-spectrum antibiotics have been ordered.  Code sepsis has been initiated.  Based on previous culture data from [facility] high risk for E. coli bacteremia is considered at this time, though this is my current impression I do not have final diagnostic testing  Patient's presentation is most consistent with acute presentation with potential threat to life or bodily function.       Lactic acid has resulted as normal Patient and family including her son at the bedside both understand agreeable plan for admission.  Consult placed and accepted to the hospital service by Dr. Para March     FINAL CLINICAL IMPRESSION(S) / ED DIAGNOSES   Final diagnoses:  Bacteremia  Sepsis due to Escherichia coli without acute organ dysfunction [facility])     Rx / DC Orders   ED Discharge Orders     None        Note:  This document was  prepared using Dragon voice recognition software and may include unintentional dictation errors.   Sharyn Creamer, MD 01/30/23 [phone]

## 2023-01-29 NOTE — ED Triage Notes (Addendum)
Pt to ed from [facility] via [facility] for code sepsis. Pt is caox4 and was sent over by facility for possible sepsis. Pt has been on abx of UTI for the last 3 days and hasn't been getting better.  EMS vitals: 90/50 T 100.2 RR 35 20G LAC HR 125 118 BGL

## 2023-01-29 NOTE — Sepsis Progress Note (Signed)
Elink monitoring for the code sepsis protocol.  

## 2023-01-30 DIAGNOSIS — K219 Gastro-esophageal reflux disease without esophagitis: Secondary | ICD-10-CM | POA: Diagnosis present

## 2023-01-30 DIAGNOSIS — G894 Chronic pain syndrome: Secondary | ICD-10-CM | POA: Diagnosis present

## 2023-01-30 DIAGNOSIS — Z7901 Long term (current) use of anticoagulants: Secondary | ICD-10-CM | POA: Diagnosis not present

## 2023-01-30 DIAGNOSIS — H6090 Unspecified otitis externa, unspecified ear: Secondary | ICD-10-CM

## 2023-01-30 DIAGNOSIS — Z86711 Personal history of pulmonary embolism: Secondary | ICD-10-CM | POA: Diagnosis not present

## 2023-01-30 DIAGNOSIS — M797 Fibromyalgia: Secondary | ICD-10-CM | POA: Diagnosis present

## 2023-01-30 DIAGNOSIS — J029 Acute pharyngitis, unspecified: Secondary | ICD-10-CM

## 2023-01-30 DIAGNOSIS — N390 Urinary tract infection, site not specified: Secondary | ICD-10-CM | POA: Diagnosis not present

## 2023-01-30 DIAGNOSIS — E785 Hyperlipidemia, unspecified: Secondary | ICD-10-CM | POA: Diagnosis present

## 2023-01-30 DIAGNOSIS — M316 Other giant cell arteritis: Secondary | ICD-10-CM | POA: Diagnosis present

## 2023-01-30 DIAGNOSIS — I959 Hypotension, unspecified: Secondary | ICD-10-CM

## 2023-01-30 DIAGNOSIS — A419 Sepsis, unspecified organism: Secondary | ICD-10-CM | POA: Diagnosis not present

## 2023-01-30 DIAGNOSIS — I9589 Other hypotension: Secondary | ICD-10-CM

## 2023-01-30 DIAGNOSIS — D849 Immunodeficiency, unspecified: Secondary | ICD-10-CM | POA: Diagnosis present

## 2023-01-30 DIAGNOSIS — J449 Chronic obstructive pulmonary disease, unspecified: Secondary | ICD-10-CM | POA: Insufficient documentation

## 2023-01-30 DIAGNOSIS — I428 Other cardiomyopathies: Secondary | ICD-10-CM | POA: Diagnosis present

## 2023-01-30 DIAGNOSIS — E8809 Other disorders of plasma-protein metabolism, not elsewhere classified: Secondary | ICD-10-CM | POA: Diagnosis present

## 2023-01-30 DIAGNOSIS — Z6841 Body Mass Index (BMI) 40.0 and over, adult: Secondary | ICD-10-CM | POA: Diagnosis not present

## 2023-01-30 DIAGNOSIS — I5022 Chronic systolic (congestive) heart failure: Secondary | ICD-10-CM | POA: Diagnosis present

## 2023-01-30 DIAGNOSIS — I110 Hypertensive heart disease with heart failure: Secondary | ICD-10-CM | POA: Diagnosis present

## 2023-01-30 DIAGNOSIS — N898 Other specified noninflammatory disorders of vagina: Secondary | ICD-10-CM | POA: Diagnosis present

## 2023-01-30 DIAGNOSIS — T380X5A Adverse effect of glucocorticoids and synthetic analogues, initial encounter: Secondary | ICD-10-CM | POA: Diagnosis present

## 2023-01-30 DIAGNOSIS — J4489 Other specified chronic obstructive pulmonary disease: Secondary | ICD-10-CM | POA: Diagnosis present

## 2023-01-30 DIAGNOSIS — Z933 Colostomy status: Secondary | ICD-10-CM | POA: Diagnosis not present

## 2023-01-30 DIAGNOSIS — G629 Polyneuropathy, unspecified: Secondary | ICD-10-CM | POA: Diagnosis present

## 2023-01-30 DIAGNOSIS — R7881 Bacteremia: Secondary | ICD-10-CM | POA: Diagnosis present

## 2023-01-30 DIAGNOSIS — G4730 Sleep apnea, unspecified: Secondary | ICD-10-CM | POA: Diagnosis present

## 2023-01-30 DIAGNOSIS — E43 Unspecified severe protein-calorie malnutrition: Secondary | ICD-10-CM | POA: Diagnosis present

## 2023-01-30 DIAGNOSIS — Z1152 Encounter for screening for COVID-19: Secondary | ICD-10-CM | POA: Diagnosis not present

## 2023-01-30 DIAGNOSIS — E2749 Other adrenocortical insufficiency: Secondary | ICD-10-CM | POA: Diagnosis present

## 2023-01-30 DIAGNOSIS — A4151 Sepsis due to Escherichia coli [E. coli]: Secondary | ICD-10-CM | POA: Diagnosis not present

## 2023-01-30 LAB — RESP PANEL BY RT-PCR (RSV, FLU A&B, COVID)  RVPGX2
Influenza A by PCR: NEGATIVE
Influenza B by PCR: NEGATIVE
Resp Syncytial Virus by PCR: NEGATIVE
SARS Coronavirus 2 by RT PCR: NEGATIVE

## 2023-01-30 LAB — CBC
HCT: 25.6 % — ABNORMAL LOW (ref 36.0–46.0)
Hemoglobin: 8.5 g/dL — ABNORMAL LOW (ref 12.0–15.0)
MCH: 28.3 pg (ref 26.0–34.0)
MCHC: 33.2 g/dL (ref 30.0–36.0)
MCV: 85.3 fL (ref 80.0–100.0)
Platelets: 617 10*3/uL — ABNORMAL HIGH (ref 150–400)
RBC: 3 MIL/uL — ABNORMAL LOW (ref 3.87–5.11)
RDW: 16.5 % — ABNORMAL HIGH (ref 11.5–15.5)
WBC: 17.2 10*3/uL — ABNORMAL HIGH (ref 4.0–10.5)
nRBC: 0 % (ref 0.0–0.2)

## 2023-01-30 LAB — COMPREHENSIVE METABOLIC PANEL
ALT: 21 U/L (ref 0–44)
AST: 23 U/L (ref 15–41)
Albumin: <1.5 g/dL — ABNORMAL LOW (ref 3.5–5.0)
Alkaline Phosphatase: 127 U/L — ABNORMAL HIGH (ref 38–126)
Anion gap: 6 (ref 5–15)
BUN: 17 mg/dL (ref 8–23)
CO2: 22 mmol/L (ref 22–32)
Calcium: 7.8 mg/dL — ABNORMAL LOW (ref 8.9–10.3)
Chloride: 107 mmol/L (ref 98–111)
Creatinine, Ser: 0.67 mg/dL (ref 0.44–1.00)
GFR, Estimated: >60 mL/min (ref >60)
Glucose, Bld: 88 mg/dL (ref 70–99)
Potassium: 3.9 mmol/L (ref 3.5–5.1)
Sodium: 135 mmol/L (ref 135–145)
Total Bilirubin: 0.6 mg/dL (ref 0.0–1.2)
Total Protein: 4.6 g/dL — ABNORMAL LOW (ref 6.5–8.1)

## 2023-01-30 LAB — GROUP A STREP BY PCR: Group A Strep by PCR: NOT DETECTED

## 2023-01-30 LAB — PROCALCITONIN: Procalcitonin: 0.78 ng/mL

## 2023-01-30 MED ORDER — VANCOMYCIN HCL 1500 MG/300ML IV SOLN
1500.0000 mg | Freq: Once | INTRAVENOUS | Status: AC
  Administered 2023-01-30: 1500 mg via INTRAVENOUS
  Filled 2023-01-30: qty 300

## 2023-01-30 MED ORDER — FERROUS SULFATE 325 (65 FE) MG PO TABS
325.0000 mg | ORAL_TABLET | Freq: Two times a day (BID) | ORAL | Status: DC
  Administered 2023-01-30 – 2023-02-04 (×12): 325 mg via ORAL
  Filled 2023-01-30 (×12): qty 1

## 2023-01-30 MED ORDER — POTASSIUM CHLORIDE CRYS ER 20 MEQ PO TBCR
20.0000 meq | EXTENDED_RELEASE_TABLET | Freq: Every day | ORAL | Status: DC
  Administered 2023-01-30 – 2023-02-04 (×6): 20 meq via ORAL
  Filled 2023-01-30 (×6): qty 1

## 2023-01-30 MED ORDER — MELATONIN 5 MG PO TABS
5.0000 mg | ORAL_TABLET | Freq: Every evening | ORAL | Status: DC | PRN
  Administered 2023-02-01 – 2023-02-03 (×3): 5 mg via ORAL
  Filled 2023-01-30 (×3): qty 1

## 2023-01-30 MED ORDER — FOLIC ACID 1 MG PO TABS
1.0000 mg | ORAL_TABLET | Freq: Every day | ORAL | Status: DC
  Administered 2023-01-30 – 2023-02-04 (×6): 1 mg via ORAL
  Filled 2023-01-30 (×6): qty 1

## 2023-01-30 MED ORDER — VITAMIN D 25 MCG (1000 UNIT) PO TABS
1000.0000 [IU] | ORAL_TABLET | Freq: Every day | ORAL | Status: DC
  Administered 2023-01-30 – 2023-02-04 (×6): 1000 [IU] via ORAL
  Filled 2023-01-30 (×6): qty 1

## 2023-01-30 MED ORDER — HYDROCORTISONE 5 MG PO TABS
5.0000 mg | ORAL_TABLET | Freq: Every evening | ORAL | Status: DC
  Administered 2023-01-30 – 2023-02-04 (×6): 5 mg via ORAL
  Filled 2023-01-30 (×6): qty 1

## 2023-01-30 MED ORDER — ACETAMINOPHEN 650 MG RE SUPP: 650.0000 mg | Freq: Four times a day (QID) | RECTAL | Status: DC | PRN

## 2023-01-30 MED ORDER — IPRATROPIUM-ALBUTEROL 0.5-2.5 (3) MG/3ML IN SOLN: 3.0000 mL | Freq: Four times a day (QID) | RESPIRATORY_TRACT | Status: DC | PRN

## 2023-01-30 MED ORDER — SODIUM CHLORIDE 0.9 % IV SOLN
2.0000 g | Freq: Three times a day (TID) | INTRAVENOUS | Status: DC
  Administered 2023-01-30 – 2023-02-01 (×7): 2 g via INTRAVENOUS
  Filled 2023-01-30 (×8): qty 12.5

## 2023-01-30 MED ORDER — METRONIDAZOLE 500 MG/100ML IV SOLN
500.0000 mg | Freq: Two times a day (BID) | INTRAVENOUS | Status: DC
  Administered 2023-01-30 – 2023-02-01 (×6): 500 mg via INTRAVENOUS
  Filled 2023-01-30 (×6): qty 100

## 2023-01-30 MED ORDER — ONDANSETRON HCL 4 MG/2ML IJ SOLN: 4.0000 mg | Freq: Four times a day (QID) | INTRAMUSCULAR | Status: DC | PRN

## 2023-01-30 MED ORDER — OFLOXACIN 0.3 % OP SOLN
5.0000 [drp] | Freq: Every day | OPHTHALMIC | Status: AC
  Administered 2023-01-30 – 2023-02-01 (×3): 5 [drp] via OTIC
  Filled 2023-01-30: qty 5

## 2023-01-30 MED ORDER — ONDANSETRON HCL 4 MG PO TABS: 4.0000 mg | ORAL_TABLET | Freq: Four times a day (QID) | ORAL | Status: DC | PRN

## 2023-01-30 MED ORDER — POLYETHYLENE GLYCOL 3350 17 G PO PACK: 17.0000 g | PACK | Freq: Every day | ORAL | Status: DC | PRN

## 2023-01-30 MED ORDER — OXYCODONE HCL 5 MG PO TABS
5.0000 mg | ORAL_TABLET | ORAL | Status: DC | PRN
  Administered 2023-01-31 – 2023-02-03 (×6): 5 mg via ORAL
  Filled 2023-01-30 (×6): qty 1

## 2023-01-30 MED ORDER — OXYCODONE-ACETAMINOPHEN 5-325 MG PO TABS
1.0000 | ORAL_TABLET | ORAL | Status: DC | PRN
  Administered 2023-01-31 – 2023-02-04 (×7): 1 via ORAL
  Filled 2023-01-30 (×7): qty 1

## 2023-01-30 MED ORDER — APIXABAN 5 MG PO TABS
5.0000 mg | ORAL_TABLET | Freq: Two times a day (BID) | ORAL | Status: DC
  Administered 2023-01-30 – 2023-02-04 (×12): 5 mg via ORAL
  Filled 2023-01-30 (×12): qty 1

## 2023-01-30 MED ORDER — THIAMINE MONONITRATE 100 MG PO TABS
100.0000 mg | ORAL_TABLET | Freq: Every day | ORAL | Status: DC
  Administered 2023-01-30 – 2023-02-04 (×6): 100 mg via ORAL
  Filled 2023-01-30 (×6): qty 1

## 2023-01-30 MED ORDER — FUROSEMIDE 20 MG PO TABS
20.0000 mg | ORAL_TABLET | Freq: Every day | ORAL | Status: DC
  Administered 2023-01-30 – 2023-02-03 (×5): 20 mg via ORAL
  Filled 2023-01-30 (×5): qty 1

## 2023-01-30 MED ORDER — HYDROCORTISONE 5 MG PO TABS
15.0000 mg | ORAL_TABLET | Freq: Every morning | ORAL | Status: DC
  Administered 2023-01-30 – 2023-02-04 (×6): 15 mg via ORAL
  Filled 2023-01-30 (×6): qty 1

## 2023-01-30 MED ORDER — OXYCODONE-ACETAMINOPHEN 10-325 MG PO TABS: 1.0000 | ORAL_TABLET | ORAL | Status: DC | PRN

## 2023-01-30 MED ORDER — DOCUSATE SODIUM 100 MG PO CAPS
100.0000 mg | ORAL_CAPSULE | Freq: Every day | ORAL | Status: DC
  Administered 2023-01-30 – 2023-02-04 (×6): 100 mg via ORAL
  Filled 2023-01-30 (×6): qty 1

## 2023-01-30 MED ORDER — LACTATED RINGERS IV SOLN
150.0000 mL/h | INTRAVENOUS | Status: DC
Start: 2023-01-30 — End: 2023-01-30

## 2023-01-30 MED ORDER — VANCOMYCIN HCL 1750 MG/350ML IV SOLN
1750.0000 mg | INTRAVENOUS | Status: DC
  Administered 2023-01-31 (×2): 1750 mg via INTRAVENOUS
  Filled 2023-01-30 (×2): qty 350

## 2023-01-30 MED ORDER — MIDODRINE HCL 5 MG PO TABS
10.0000 mg | ORAL_TABLET | Freq: Three times a day (TID) | ORAL | Status: DC
  Administered 2023-01-30 – 2023-02-04 (×18): 10 mg via ORAL
  Filled 2023-01-30 (×18): qty 2

## 2023-01-30 MED ORDER — CALCIUM CARBONATE 1250 (500 CA) MG PO TABS
2.0000 | ORAL_TABLET | Freq: Every day | ORAL | Status: DC
  Administered 2023-01-30 – 2023-02-04 (×6): 2500 mg via ORAL
  Filled 2023-01-30 (×6): qty 2

## 2023-01-30 MED ORDER — ACETAMINOPHEN 325 MG PO TABS
650.0000 mg | ORAL_TABLET | Freq: Four times a day (QID) | ORAL | Status: DC | PRN
Start: 2023-01-30 — End: 2023-02-01

## 2023-01-30 MED ORDER — GABAPENTIN 300 MG PO CAPS
300.0000 mg | ORAL_CAPSULE | Freq: Two times a day (BID) | ORAL | Status: DC
  Administered 2023-01-30 – 2023-02-04 (×11): 300 mg via ORAL
  Filled 2023-01-30 (×11): qty 1

## 2023-01-30 MED ORDER — VITAMIN B-12 1000 MCG PO TABS
1000.0000 ug | ORAL_TABLET | Freq: Every day | ORAL | Status: DC
  Administered 2023-01-30 – 2023-02-04 (×6): 1000 ug via ORAL
  Filled 2023-01-30 (×2): qty 2
  Filled 2023-01-30 (×4): qty 1

## 2023-01-30 MED ORDER — NYSTATIN 100000 UNIT/GM EX CREA
1.0000 | TOPICAL_CREAM | Freq: Two times a day (BID) | CUTANEOUS | Status: DC
  Administered 2023-01-30 – 2023-02-04 (×11): 1 via TOPICAL
  Filled 2023-01-30: qty 30

## 2023-01-30 MED ORDER — NORTRIPTYLINE HCL 25 MG PO CAPS
50.0000 mg | ORAL_CAPSULE | Freq: Every day | ORAL | Status: DC
  Administered 2023-01-30 – 2023-02-03 (×6): 50 mg via ORAL
  Filled 2023-01-30 (×7): qty 2

## 2023-01-30 NOTE — Assessment & Plan Note (Signed)
Chronic anticoagulation  Continue Eliquis 

## 2023-01-30 NOTE — Assessment & Plan Note (Signed)
Not acutely exacerbated Continue home inhalers.   

## 2023-01-30 NOTE — Progress Notes (Signed)
CODE SEPSIS - PHARMACY COMMUNICATION  **Broad Spectrum Antibiotics should be administered within 1 hour of Sepsis diagnosis**  Time Code Sepsis Called/Page Received: 1/17 @ 2254  Antibiotics Ordered: Vanc, cefepime  Time of 1st antibiotic administration: Cefepime 2 gm IV X 1 on 1/17 @ 2323   Additional action taken by pharmacy:   If necessary, Name of Provider/Nurse Contacted:     Marsha Hillman D ,PharmD Clinical Pharmacist  01/30/2023  12:31 AM

## 2023-01-30 NOTE — Assessment & Plan Note (Signed)
High risk for severe infection due to chronic immunosuppression Source uncertain Sepsis criteria include fever, tachycardia and tachypnea, hypotension with leukocytosis.  Lactic acid pending Urine analysis unremarkable and chest x-ray clear Continue broad-spectrum antibiotics with cefepime and vancomycin Continue sepsis fluids Follow lactic acid, procalcitonin Follow blood cultures

## 2023-01-30 NOTE — ED Notes (Signed)
Patient brief and chuck pad changed.

## 2023-01-30 NOTE — Assessment & Plan Note (Addendum)
Adrenal insufficiency, secondary to chronic steroids Chronic systemic steroid use Suspect chronic versus related to sepsis  continue midodrine 10 mg 3 times daily Continue hydrocortisone 5 mg, 3 tabs every morning 1 tablet every afternoon

## 2023-01-30 NOTE — H&P (Signed)
History and Physical    Patient: Wendy Sanchez:[id number] DOB: [birth year] DOA: 01/29/2023 DOS: the patient was seen and examined on 01/30/2023 PCP: Gorden Harms, PA-C  Patient coming from: Home  Chief Complaint:  Chief Complaint  Patient presents with   Code Sepsis    HPI: [name] is a 73 y.o. 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 coli UTI and RLL pneumonia, as well as otitis externa, discharged to SNF, who was sent from the facility via EMS for code sepsis.  With EMS, patient febrile to 100.2, tachycardic to 125 tachypneic to 35 and hypotensive to 90/50.  Patient complains of generalized weakness and having a sore throat for the past 3 days.  She denies cough, shortness of breath or chest pain, fever or chills and denies abdominal pain, nausea or vomiting. ED course and data review: Tmax in the ED 99.9, heart rate 125 respirations 30 O2 sat 99% on room air and BP 102/76. Labs notable for the following: WBC 20,000, lactic acid pending Respiratory viral panel negative, UA unremarkable, chest x-ray clear Hemoglobin at baseline at 9.3 CMP for the most part unremarkable EKG, personally viewed and interpreted showing sinus tachycardia at 124 with no concerning ST-T wave changes.  Patient started on cefepime and vancomycin and given an LR bolus Hospitalist consulted for admission.    Past Medical History:  Diagnosis Date   Allergic rhinitis    Arthritis    Asthma    problems with fumes and aerosols cause asthma   Chest pain 10/19/2016   CHF (congestive heart failure) (HCC)     Complication of anesthesia    awakens during surgery; has occurred with last 3-4 surgeries    COPD (chronic obstructive pulmonary disease) (HCC)    DVT (deep venous thrombosis) (HCC) 07/13/2018   Right leg   Environmental allergies    fumes    Fibromyalgia    GERD (gastroesophageal reflux disease)    Headache    History of bronchitis    Hx of total knee arthroplasty 12/13/2015   Hyperlipidemia    Hypertension    Morbid obesity with BMI of 45.0-49.9, adult (HCC) 07/25/2015   NICM (nonischemic cardiomyopathy) (HCC)    a. ? PVC mediated;  b. 06/2016 Echo: EF 25-30%, mild LVH, mild LAE, mild AI/MR/TR/PR; c. 08/2016 Cath: nl cors, EF 25%.   Numbness    hands bilat when driving; improves when not preforming task    Osteoarthritis of left knee 08/02/2015   PE (pulmonary thromboembolism) (HCC) 07/13/2018   Pes anserinus bursitis of left knee 05/18/2016   Presence of right artificial knee joint 05/18/2016   PVC (premature ventricular contraction) 06/04/2017   PVC's (premature ventricular contractions)    a. 11/2016 Amio started;  b. 11/29/16 48h Holter: 57574 PVC's (41%); c. 12/2016 24h Holter: 18040 PVC's (15%); c. 02/2017 48h Holter: 37262 PVC's (41%).   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   Temporal arteritis (HCC) 05/01/2020   Tinnitus    comes and goes  Unilateral primary osteoarthritis, right knee 12/13/2015   Vertigo    none for over 2 yrs   Past Surgical History:  Procedure Laterality Date   ABDOMINAL HYSTERECTOMY  1979   left ovary remains   ABDOMINAL HYSTERECTOMY     ARTERY BIOPSY Left 06/07/2019   Procedure: BIOPSY TEMPORAL ARTERY;  Surgeon: Annice Needy, MD;  Location: [facility] ORS;  Service: Vascular;  Laterality: Left;   CHOLECYSTECTOMY     COLON SURGERY     COLONOSCOPY     COLONOSCOPY N/A 10/30/2019   Procedure: COLONOSCOPY;  Surgeon:  Regis Bill, MD;  Location: [facility] ENDOSCOPY;  Service: Endoscopy;  Laterality: N/A;   ESOPHAGOGASTRODUODENOSCOPY N/A 10/30/2019   Procedure: ESOPHAGOGASTRODUODENOSCOPY (EGD);  Surgeon: Regis Bill, MD;  Location: [facility] ENDOSCOPY;  Service: Endoscopy;  Laterality: N/A;   fibrous tissue removed from right shoulder and back of neck      2 years ago    FLOOR OF MOUTH BIOPSY N/A 02/28/2019   Procedure: SALIVARY GLAND BIOPSY;  Surgeon: Bud Face, MD;  Location: [facility] ORS;  Service: ENT;  Laterality: N/A;   FRACTURE SURGERY     left small finger   GANGLION CYST EXCISION     GASTRIC BYPASS  1980   states had bypass with banding and band left in -   GASTRIC BYPASS OPEN     JOINT REPLACEMENT Bilateral 2017   knees   KNEE SURGERY Bilateral    both knees  2001   LAPAROSCOPIC SIGMOID COLECTOMY Left 09/13/2020   at [facility] for a perforated sigmoid diverticulitis   NECK SURGERY N/A 1205/19   ganglion cyst removal, benigh    PARTIAL COLECTOMY  09/2020   PULMONARY THROMBECTOMY Bilateral 07/14/2018   Procedure: PULMONARY THROMBECTOMY;  Surgeon: Annice Needy, MD;  Location: [facility] INVASIVE CV LAB;  Service: Cardiovascular;  Laterality: Bilateral;   PVC ABLATION N/A 06/04/2017   Procedure: PVC ABLATION;  Surgeon: Marinus Maw, MD;  Location: [facility] INVASIVE CV LAB;  Service: Cardiovascular;  Laterality: N/A;   RIGHT/LEFT HEART CATH AND CORONARY ANGIOGRAPHY Bilateral 08/12/2016   Procedure: Right/Left Heart Cath and Coronary Angiography;  Surgeon: Alwyn Pea, MD;  Location: [facility] INVASIVE CV LAB;  Service: Cardiovascular;  Laterality: Bilateral;   TONSILLECTOMY     age 39   TOTAL KNEE ARTHROPLASTY Left 08/02/2015   Procedure: LEFT TOTAL KNEE ARTHROPLASTY;  Surgeon: Kathryne Hitch, MD;  Location: [facility] ORS;  Service: Orthopedics;  Laterality: Left;   TOTAL KNEE ARTHROPLASTY Right 12/13/2015   Procedure: RIGHT TOTAL KNEE ARTHROPLASTY;  Surgeon: Kathryne Hitch, MD;   Location: [facility] ORS;  Service: Orthopedics;  Laterality: Right;   Social History:  reports that she quit smoking about 2 years ago. Her smoking use included cigarettes. She started smoking about 52 years ago. She has a 12.5 pack-year smoking history. Her smokeless tobacco use includes chew. She reports that she does not drink alcohol and does not use drugs.  Allergies  Allergen Reactions   Hydrocodone-Acetaminophen Swelling    hands   Iodine Anaphylaxis and Swelling    Iv dye 02/03/21-Ispoke to patient and she had IV contrast  X4 last year and has had no problem. ( Last CT abdomen with contrast was on 01/02/21.-    Other Other (See Comments)    ALLERGY TO METAL - BLACKENS SKIN AND CAUSES A RASH    Tape Swelling and Other (See Comments)    Skin comes off.  Paper tape is ok tegaderm OK   Peanut  Oil Other (See Comments)    ** Nuts cause runny nose   Shellfish Allergy Nausea And Vomiting and Swelling    Episode of GI infection after eating clam chowder. Still eats shrimp and other seafood    Family History  Problem Relation Age of Onset   Hypertension Father    Deep vein thrombosis Father    Dementia Mother    Diabetes Sister    Congestive Heart Failure Sister    Congestive Heart Failure Brother    Congestive Heart Failure Daughter    Congestive Heart Failure Son    Breast cancer Maternal Aunt        great aunt    Prior to Admission medications   Medication Sig Start Date End Date Taking? Authorizing Provider  acetaminophen (TYLENOL) 325 MG tablet Take 2 tablets (650 mg total) by mouth every 6 (six) hours as needed for mild pain or fever. 11/17/20   Myrtie Neither, MD  amoxicillin (AMOXIL) 500 MG capsule Take 500 mg by mouth 4 (four) times daily. For salmonella Patient not taking: Reported on 07/01/2022    [provider]  apixaban (ELIQUIS) 5 MG TABS tablet Take 1 tablet (5 mg total) by mouth 2 (two) times daily. 07/22/18 07/01/22  Ihor Austin, MD  calcium carbonate  (OS-CAL - DOSED IN MG OF ELEMENTAL CALCIUM) 1250 (500 Ca) MG tablet Take 2 tablets by mouth in the morning.    [provider]  diphenhydrAMINE (BENADRYL) 25 mg capsule Take 25 mg by mouth every 6 (six) hours as needed for itching or allergies.    [provider]  docusate sodium (COLACE) 100 MG capsule Take 200 mg by mouth at bedtime as needed for mild constipation.    [provider]  ferrous sulfate 325 (65 FE) MG tablet Take 325 mg by mouth 2 (two) times daily with a meal.    [provider]  fluconazole (DIFLUCAN) 100 MG tablet Take 100 mg by mouth daily.    [provider]  gabapentin (NEURONTIN) 300 MG capsule Take 1 capsule (300 mg total) by mouth 2 (two) times daily. 12/09/22 04/08/23  Yevette Edwards, MD  gabapentin (NEURONTIN) 600 MG tablet Take 1 tablet (600 mg total) by mouth 3 (three) times daily. 07/01/22 10/29/22  Yevette Edwards, MD  ipratropium-albuterol (DUONEB) 0.5-2.5 (3) MG/3ML SOLN Take 3 mLs by nebulization every 6 (six) hours as needed (shortness of breath).    [provider]  levocetirizine (XYZAL) 5 MG tablet Take 10 mg by mouth every evening.    [provider]  nortriptyline (PAMELOR) 50 MG capsule Take 50 mg by mouth at bedtime.    [provider]  nystatin (MYCOSTATIN) 100000 UNIT/ML suspension Take 5 mLs by mouth 4 (four) times daily. 12/03/20   [provider]  nystatin cream (MYCOSTATIN) Apply 1 application topically 2 (two) times daily. (Apply under skin folds)    [provider]  ondansetron (ZOFRAN) 4 MG tablet Take 1 tablet (4 mg total) by mouth every 6 (six) hours as needed for nausea. Patient not taking: Reported on 03/17/2022 02/05/21   Marguerita Merles Latif, DO  oxyCODONE-acetaminophen (PERCOCET) 10-325 MG tablet Take 1 tablet by mouth every 4 (four) hours as needed for pain. 01/08/23 02/07/23  Yevette Edwards, MD  pantoprazole (PROTONIX) 40 MG tablet Take 40 mg by mouth daily.     [provider]  polyethylene glycol (MIRALAX / GLYCOLAX) 17 g packet Take 17 g by mouth daily as needed for  moderate constipation. 11/17/20   Myrtie Neither, MD  potassium chloride SA (KLOR-CON M) 20 MEQ tablet Take 20 mEq by mouth 2 (two) times daily.    [provider]  predniSONE (DELTASONE) 10 MG tablet Take 1 mg by mouth in the morning.    [provider]  torsemide (DEMADEX) 10 MG tablet Take 10 mg by mouth daily.    [provider]    Physical Exam: Vitals:   01/29/23 2210 01/29/23 2220 01/29/23 2300  BP: 102/76 108/76 116/70  Pulse: (!) 125 (!) 123 (!) 116  Resp: (!) 30 (!) 23 (!) 25  Temp: 99.9 F (37.7 C)    TempSrc: Oral    SpO2: 99% 100% 100%   Physical Exam Vitals and nursing note reviewed.  Constitutional:      General: She is not in acute distress.    Appearance: She is morbidly obese.     Comments: Chronically ill-appearing female, conversational dyspnea.  Appears frail  HENT:     Head: Normocephalic and atraumatic.  Cardiovascular:     Rate and Rhythm: Regular rhythm. Tachycardia present.     Heart sounds: Normal heart sounds.  Pulmonary:     Effort: Tachypnea present.     Breath sounds: Normal breath sounds.  Abdominal:     Palpations: Abdomen is soft.     Tenderness: There is no abdominal tenderness.  Musculoskeletal:     Right lower leg: Edema present.     Left lower leg: Edema present.  Neurological:     Mental Status: Mental status is at baseline.     Labs on Admission: I have personally reviewed following labs and imaging studies  CBC: Recent Labs  Lab 01/29/23 2215  WBC 20.7*  NEUTROABS 18.4*  HGB 9.3*  HCT 29.1*  MCV 87.9  PLT 633*   Basic Metabolic Panel: Recent Labs  Lab 01/29/23 2215  NA 137  K 4.5  CL 109  CO2 20*  GLUCOSE 104*  BUN 17  CREATININE 0.72  CALCIUM 7.9*   GFR: CrCl cannot be calculated (Unknown ideal weight.). Liver Function Tests: Recent Labs  Lab 01/29/23 2215   AST 33  ALT 23  ALKPHOS 135*  BILITOT 0.8  PROT 5.0*  ALBUMIN <1.5*   No results for input(s): "LIPASE", "AMYLASE" in the last 168 hours. No results for input(s): "AMMONIA" in the last 168 hours. Coagulation Profile: No results for input(s): "INR", "PROTIME" in the last 168 hours. Cardiac Enzymes: No results for input(s): "CKTOTAL", "CKMB", "CKMBINDEX", "TROPONINI" in the last 168 hours. BNP (last 3 results) No results for input(s): "PROBNP" in the last 8760 hours. HbA1C: No results for input(s): "HGBA1C" in the last 72 hours. CBG: No results for input(s): "GLUCAP" in the last 168 hours. Lipid Profile: No results for input(s): "CHOL", "HDL", "LDLCALC", "TRIG", "CHOLHDL", "LDLDIRECT" in the last 72 hours. Thyroid Function Tests: No results for input(s): "TSH", "T4TOTAL", "FREET4", "T3FREE", "THYROIDAB" in the last 72 hours. Anemia Panel: No results for input(s): "VITAMINB12", "FOLATE", "FERRITIN", "TIBC", "IRON", "RETICCTPCT" in the last 72 hours. Urine analysis:    Component Value Date/Time   COLORURINE AMBER (A) 01/29/2023 2238   APPEARANCEUR HAZY (A) 01/29/2023 2238   LABSPEC 1.024 01/29/2023 2238   PHURINE 5.0 01/29/2023 2238   GLUCOSEU NEGATIVE 01/29/2023 2238   HGBUR NEGATIVE 01/29/2023 2238   BILIRUBINUR NEGATIVE 01/29/2023 2238   KETONESUR NEGATIVE 01/29/2023 2238   PROTEINUR 30 (A) 01/29/2023 2238   NITRITE NEGATIVE 01/29/2023 2238   LEUKOCYTESUR TRACE (A) 01/29/2023  2238    Radiological Exams on Admission: DG Chest Portable 1 View Result Date: 01/29/2023 CLINICAL DATA:  Possible sepsis. On antibiotics for UTI. Not improving. EXAM: PORTABLE CHEST 1 VIEW COMPARISON:  01/30/2021 FINDINGS: Stable cardiomegaly. Aortic atherosclerotic calcification. Left basilar atelectasis. No focal consolidation, pleural effusion, or pneumothorax. No displaced rib fractures. IMPRESSION: No active disease. Electronically Signed   By: Minerva Fester M.D.   On: 01/29/2023 23:06      Data Reviewed: Relevant notes from primary care and specialist visits, past discharge summaries as available in EHR, including Care Everywhere. Prior diagnostic testing as pertinent to current admission diagnoses Updated medications and problem lists for reconciliation ED course, including vitals, labs, imaging, treatment and response to treatment Triage notes, nursing and pharmacy notes and ED provider's notes Notable results as noted in HPI   Assessment and Plan: * Sepsis (HCC) High risk for severe infection due to chronic immunosuppression Source uncertain Sepsis criteria include fever, tachycardia and tachypnea, hypotension with leukocytosis.  Lactic acid pending Urine analysis unremarkable and chest x-ray clear Continue broad-spectrum antibiotics with cefepime and vancomycin Continue sepsis fluids Follow lactic acid, procalcitonin Follow blood cultures  Hypotension Adrenal insufficiency, secondary to chronic steroids Chronic systemic steroid use Suspect chronic versus related to sepsis  continue midodrine 10 mg 3 times daily Continue hydrocortisone 5 mg, 3 tabs every morning 1 tablet every afternoon   Acute pharyngitis No evidence of thrush on exam Follow strep test  Otitis externa Recently diagnosed during admission to [facility] Continue ofloxacin 0.3% otic solution until 1/20  Recurrent UTI Recent E. coli UTI, hospitalized at [facility] from 1/10-1/15/25, on Keflex until 1/23 Hold Keflex while on IV antibiotics   Peripheral neuropathy Vitamin and mineral deficiencies (copper, zinc,Vitamin A, B6) Prior history of gastric bypass Continue nortriptyline and gabapentin Continue thiamine, pediatric multivitamin, folic acid, ferrous sulfate, vitamin B12 vitamin D3 25, vitamin C  Chronic pain syndrome Chronic opioid use Continue Lidoderm patch, gabapentin 300 twice daily, oxycodone 10/325 every 4 as needed  Giant cell arteritis (HCC) Previously on prednisone Currently  on hydrocortisone 5 mg 3 tablets in the a.m. 1 in the PM Followed by rheumatology at [facility]  HFrEF (heart failure with reduced ejection fraction) (HCC) LVEF 40-45% in Sept 2024 Clinically euvolemic GDMT limited by hypotension Continue Lasix 20 mg p.o. daily  History of pulmonary embolus 2020(PE) Chronic anticoagulation Continue Eliquis  Colostomy in place [facility]) Ostomy care  Morbid obesity with BMI of 45.0-49.9, adult (HCC) Complicating factor to overall prognosis and care  History of renal cell carcinoma No acute issues  Chronic obstructive pulmonary disease (COPD) (HCC) Not acutely exacerbated Continue home inhalers    DVT prophylaxis: Eliquis  Consults: none  Advance Care Planning:   Code Status: Prior   Family Communication: none  Disposition Plan: Back to previous home environment  Severity of Illness: The appropriate patient status for this patient is INPATIENT. Inpatient status is judged to be reasonable and necessary in order to provide the required intensity of service to ensure the patient's safety. The patient's presenting symptoms, physical exam findings, and initial radiographic and laboratory data in the context of their chronic comorbidities is felt to place them at high risk for further clinical deterioration. Furthermore, it is not anticipated that the patient will be medically stable for discharge from the hospital within 2 midnights of admission.   * I certify that at the point of admission it is my clinical judgment that the patient will require inpatient hospital care spanning beyond 2  midnights from the point of admission due to high intensity of service, high risk for further deterioration and high frequency of surveillance required.*  Author: Andris Baumann, MD 01/30/2023 12:57 AM  For on call review [email or web address].

## 2023-01-30 NOTE — Progress Notes (Signed)
  Progress Note   Patient: Wendy Sanchez:[id number] DOB: June 19, 1950 DOA: 01/29/2023     0 DOS: the patient was seen and examined on 01/30/2023   Brief hospital course: Patient seen and examined at bedside this morning in the presence of patient's son. Patient admitted overnight with sepsis secondary to UTI as well as pneumonia. Currently not requiring oxygen.  We will continue current antibiotics For other detailed assessment and plan please refer to H&P earlier dictated this morning by Dr. Para March.   Physical Exam: Vitals:   01/30/23 1100 01/30/23 1231 01/30/23 1400 01/30/23 1500  BP: (!) 100/53  119/81 116/80  Pulse: (!) 57  (!) 110 (!) 108  Resp: 19  19 20   Temp:  (!) 96.7 F (35.9 C) (!) 97.4 F (36.3 C)   TempSrc:  Axillary Axillary   SpO2: 100%  100% 100%  Weight:      Height:          Latest Ref Rng & Units 01/30/2023    5:49 AM 01/29/2023   10:15 PM 09/17/2022   10:17 AM  CBC  WBC 4.0 - 10.5 K/uL 17.2  20.7  5.5   Hemoglobin 12.0 - 15.0 g/dL 8.5  9.3  9.5   Hematocrit 36.0 - 46.0 % 25.6  29.1  29.4   Platelets 150 - 400 K/uL 617  633  498        Latest Ref Rng & Units 01/30/2023    5:49 AM 01/29/2023   10:15 PM 09/17/2022   10:17 AM  BMP  Glucose 70 - 99 mg/dL 88  811  914   BUN 8 - 23 mg/dL 17  17  21    Creatinine 0.44 - 1.00 mg/dL 7.82  9.56  2.13   Sodium 135 - 145 mmol/L 135  137  134   Potassium 3.5 - 5.1 mmol/L 3.9  4.5  3.6   Chloride 98 - 111 mmol/L 107  109  97   CO2 22 - 32 mmol/L 22  20  29    Calcium 8.9 - 10.3 mg/dL 7.8  7.9  7.7      Author: Loyce Dys, MD 01/30/2023 3:52 PM  For on call review [email or web address].

## 2023-01-30 NOTE — Assessment & Plan Note (Signed)
Recent E. coli UTI, hospitalized at [facility] from 1/10-1/15/25, on Keflex until 1/23 Hold Keflex while on IV antibiotics

## 2023-01-30 NOTE — Assessment & Plan Note (Signed)
Vitamin and mineral deficiencies (copper, zinc,Vitamin A, B6) Prior history of gastric bypass Continue nortriptyline and gabapentin Continue thiamine, pediatric multivitamin, folic acid, ferrous sulfate, vitamin B12 vitamin D3 25, vitamin C

## 2023-01-30 NOTE — Assessment & Plan Note (Signed)
Previously on prednisone Currently on hydrocortisone 5 mg 3 tablets in the a.m. 1 in the PM Followed by rheumatology at [facility]

## 2023-01-30 NOTE — Assessment & Plan Note (Signed)
Recently diagnosed during admission to [facility] Continue ofloxacin 0.3% otic solution until 1/20

## 2023-01-30 NOTE — Hospital Course (Signed)
START taking these medications   cephalexin 500 MG capsule Commonly known as: KEFLEX Take 1 capsule (500 mg total) by mouth every eight (8) hours for 8 doses.  furosemide 20 MG tablet Commonly known as: LASIX Take 1 tablet (20 mg total) by mouth daily.  ofloxacin 0.3 % otic solution Commonly known as: FLOXIN Administer 10 drops to the right ear daily for 5 days.     CONTINUE taking these medications   acetaminophen 500 MG tablet Commonly known as: TYLENOL Take 2 tablets (1,000 mg total) by mouth every eight (8) hours as needed.  albuterol 90 mcg/actuation inhaler Commonly known as: PROVENTIL HFA;VENTOLIN HFA Inhale 2 puffs every six (6) hours as needed for wheezing.  alendronate 35 MG tablet Commonly known as: FOSAMAX Take 1 tablet (35 mg total) by mouth every seven (7) days.  ascorbic acid (vitamin C) 500 mg Chew Chew 1 tablet daily. Chew tablet  carboxymethylcellulose sodium 1 % Dpge Commonly known as: REFRESH CELLUVISC Administer 2 drops to both eyes Three (3) times a day.  cholecalciferol (vitamin D3 25 mcg (1,000 units)) 1,000 unit (25 mcg) tablet Take 1 tablet (25 mcg total) by mouth daily.  cyanocobalamin (vitamin B-12) 1000 MCG tablet Take 1 tablet (1,000 mcg total) by mouth daily.  docusate sodium 100 MG capsule Commonly known as: COLACE Take 1 capsule (100 mg total) by mouth two (2) times a day as needed for constipation.  ELIQUIS 5 mg Tab Generic drug: apixaban Take 1 tablet by mouth twice daily  estradiol 0.01 % (0.1 mg/gram) vaginal cream Commonly known as: ESTRACE Insert 2 g into the vagina daily. For the first two weeks, apply every day. After then, apply 2-3 times a week indefinitely  ferrous sulfate 325 (65 FE) MG EC tablet Take 1 tablet (325 mg total) by mouth two (2) times a day.  fluconazole 100 MG tablet Commonly known as: DIFLUCAN Take 1 tablet (100 mg total) by mouth every third day as needed.  fluticasone propionate 50  mcg/actuation nasal spray Commonly known as: FLONASE 1 spray into each nostril daily.  folic acid 1 MG tablet Commonly known as: FOLVITE Take 1 tablet (1 mg total) by mouth daily.  gabapentin 300 MG capsule Commonly known as: NEURONTIN Take 1 capsule (300 mg total) by mouth two (2) times a day.  hydrocortisone 5 MG tablet Commonly known as: CORTEF Take 3 tablets (15 mg total) by mouth every morning AND 1 tablet (5 mg total) every afternoon after lunch and before dinner  levocetirizine 5 MG tablet Commonly known as: XYZAL Take 2 tablets (10 mg total) by mouth every evening.  lidocaine 5 % patch Commonly known as: LIDODERM Place on the skin daily. Apply to affected area for 12 hours only each day (then remove patch)  melatonin 3 mg Tab Take 1 tablet (3 mg total) by mouth nightly as needed.  midodrine 10 MG tablet Commonly known as: PROAMATINE Take 1 tablet (10 mg total) by mouth three (3) times a day.  montelukast 10 mg tablet Commonly known as: SINGULAIR Take 1 tablet (10 mg total) by mouth nightly.  nortriptyline 50 MG capsule Commonly known as: PAMELOR Take 1 capsule (50 mg total) by mouth nightly.  nystatin 100,000 unit/gram cream Commonly known as: MYCOSTATIN APPLY CREAM TOPICALLY 4 TIMES DAILY - APPLY LIBERALLY  oxyCODONE-acetaminophen 10-325 mg per tablet Commonly known as: PERCOCET Take 1 tablet by mouth every four (4) hours as needed for pain.  pantoprazole 40 MG tablet Commonly known as: Protonix  Take 1 tablet (40 mg total) by mouth daily before breakfast.  pediatric multivitamin-iron 18 mg iron Chew Chew 1 tablet daily.  polyethylene glycol 17 gram packet Commonly known as: MIRALAX Take 17 g by mouth daily as needed.  potassium chloride 20 MEQ ER tablet Take 1 tablet (20 mEq total) by mouth daily.  thiamine mononitrate (vit B1) 100 mg Tab tablet Take 1 tablet (100 mg total) by mouth daily.  triamcinolone 0.1 % cream Commonly known as:  KENALOG Apply topically daily as needed.

## 2023-01-30 NOTE — Progress Notes (Signed)
Pharmacy Antibiotic Note  Wendy Sanchez is a 73 y.o. female admitted on 01/29/2023 with sepsis.  Pharmacy has been consulted for Vanc, Cefepime dosing.  Plan: Cefepime 2 gm IV X 1 given in ED on 1/17 @ 2254. Cefepime 2 gm IV Q8H ordered to start on 1/18 @ 0700.  Vancomycin 1 gm IV X 1 given in ED on 1/17 @ 2300.  Additional Vanc 1500 mg IV X 1 ordered to make total loading dose of 2500 mg.  Vancomycin 1750 mg IV Q24H ordered to start on 1/18 @ 2300.  AUC = 480.6 Vanc trough = 10.3   Height: 5\' 8"  (172.7 cm) Weight: 124.7 kg (275 lb) IBW/kg (Calculated) : 63.9  Temp (24hrs), Avg:99.9 F (37.7 C), Min:99.9 F (37.7 C), Max:99.9 F (37.7 C)  Recent Labs  Lab 01/29/23 2215  WBC 20.7*  CREATININE 0.72  LATICACIDVEN 1.2    Estimated Creatinine Clearance: 89.8 mL/min (by C-G formula based on SCr of 0.72 mg/dL).    Allergies  Allergen Reactions   Hydrocodone-Acetaminophen Swelling    hands   Iodine Anaphylaxis and Swelling    Iv dye 02/03/21-Ispoke to patient and she had IV contrast  X4 last year and has had no problem. ( Last CT abdomen with contrast was on 01/02/21.-    Other Other (See Comments)    ALLERGY TO METAL - BLACKENS SKIN AND CAUSES A RASH    Tape Swelling and Other (See Comments)    Skin comes off.  Paper tape is ok tegaderm OK   Peanut Oil Other (See Comments)    ** Nuts cause runny nose   Shellfish Allergy Nausea And Vomiting and Swelling    Episode of GI infection after eating clam chowder. Still eats shrimp and other seafood    Antimicrobials this admission:   >>    >>   Dose adjustments this admission:   Microbiology results:  BCx:   UCx:    Sputum:    MRSA PCR:   Thank you for allowing pharmacy to be a part of this patient's care.  Kutter Schnepf D 01/30/2023 1:31 AM

## 2023-01-30 NOTE — Assessment & Plan Note (Signed)
No evidence of thrush on exam Follow strep test

## 2023-01-30 NOTE — Assessment & Plan Note (Signed)
No acute issues.

## 2023-01-30 NOTE — Assessment & Plan Note (Addendum)
Chronic opioid use Continue Lidoderm patch, gabapentin 300 twice daily, oxycodone 10/325 every 4 as needed

## 2023-01-30 NOTE — Assessment & Plan Note (Deleted)
LVEF 40-45% in Sept 2024 Clinically euvolemic GDMT limited by hypotension Continue Lasix 20 mg p.o. daily

## 2023-01-30 NOTE — Assessment & Plan Note (Signed)
Complicating factor to overall prognosis and care 

## 2023-01-30 NOTE — Assessment & Plan Note (Addendum)
LVEF 40-45% in Sept 2024 Clinically euvolemic GDMT limited by hypotension Continue Lasix 20 mg p.o. daily

## 2023-01-30 NOTE — Assessment & Plan Note (Signed)
Ostomy care

## 2023-01-31 DIAGNOSIS — R7881 Bacteremia: Secondary | ICD-10-CM | POA: Diagnosis not present

## 2023-01-31 LAB — BASIC METABOLIC PANEL
Anion gap: 6 (ref 5–15)
BUN: 16 mg/dL (ref 8–23)
CO2: 21 mmol/L — ABNORMAL LOW (ref 22–32)
Calcium: 8.1 mg/dL — ABNORMAL LOW (ref 8.9–10.3)
Chloride: 108 mmol/L (ref 98–111)
Creatinine, Ser: 0.62 mg/dL (ref 0.44–1.00)
GFR, Estimated: >60 mL/min (ref >60)
Glucose, Bld: 75 mg/dL (ref 70–99)
Potassium: 3.8 mmol/L (ref 3.5–5.1)
Sodium: 135 mmol/L (ref 135–145)

## 2023-01-31 LAB — CBC WITH DIFFERENTIAL/PLATELET
Abs Immature Granulocytes: 0.15 10*3/uL — ABNORMAL HIGH (ref 0.00–0.07)
Basophils Absolute: 0 10*3/uL (ref 0.0–0.1)
Basophils Relative: 0 %
Eosinophils Absolute: 0.8 10*3/uL — ABNORMAL HIGH (ref 0.0–0.5)
Eosinophils Relative: 7 %
HCT: 27.2 % — ABNORMAL LOW (ref 36.0–46.0)
Hemoglobin: 8.9 g/dL — ABNORMAL LOW (ref 12.0–15.0)
Immature Granulocytes: 1 %
Lymphocytes Relative: 8 %
Lymphs Abs: 0.8 10*3/uL (ref 0.7–4.0)
MCH: 28.5 pg (ref 26.0–34.0)
MCHC: 32.7 g/dL (ref 30.0–36.0)
MCV: 87.2 fL (ref 80.0–100.0)
Monocytes Absolute: 0.3 10*3/uL (ref 0.1–1.0)
Monocytes Relative: 3 %
Neutro Abs: 8.3 10*3/uL — ABNORMAL HIGH (ref 1.7–7.7)
Neutrophils Relative %: 81 %
Platelets: 606 10*3/uL — ABNORMAL HIGH (ref 150–400)
RBC: 3.12 MIL/uL — ABNORMAL LOW (ref 3.87–5.11)
RDW: 16.3 % — ABNORMAL HIGH (ref 11.5–15.5)
WBC: 10.4 10*3/uL (ref 4.0–10.5)
nRBC: 0 % (ref 0.0–0.2)

## 2023-01-31 LAB — URINE CULTURE: Culture: NO GROWTH

## 2023-01-31 MED ORDER — DIPHENHYDRAMINE HCL 25 MG PO CAPS
25.0000 mg | ORAL_CAPSULE | Freq: Once | ORAL | Status: AC
Start: 2023-01-31 — End: 2023-01-31
  Administered 2023-01-31: 25 mg via ORAL
  Filled 2023-01-31: qty 1

## 2023-01-31 NOTE — Progress Notes (Signed)
Progress Note   Patient: Wendy Sanchez ZOX:[id number] DOB: 03-03-1950 DOA: 01/29/2023     1 DOS: the patient was seen and examined on 01/31/2023   Brief hospital course: Wendy Sanchez is a 73 y.o. 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 coli UTI and RLL pneumonia, as well as otitis externa, discharged to SNF, who was sent from the facility via EMS for code sepsis.  With EMS, patient febrile to 100.2, tachycardic to 125 tachypneic to 35 and hypotensive to 90/50.  Patient complains of generalized weakness and having a sore throat for the past 3 days before presentation.  ED course and data review: Tmax in the ED 99.9, heart rate 125 respirations 30 O2 sat 99% on room air and BP 102/76.  Hospitalist service contacted for admission.    Assessment and Plan:  Sepsis (HCC) likely in the setting of incomplete treatment of UTI, pneumonia otitis externa High risk for severe infection due to chronic immunosuppression Sepsis criteria include fever, tachycardia and tachypnea, hypotension with leukocytosis.   Lactic acid 1.2 and procalcitonin 0.78 Urine analysis unremarkable and chest x-ray clear Currently on broad-spectrum antibiotics De-escalate antibiotics pending clinical course   Hypotension Adrenal insufficiency, secondary to chronic steroids Chronic systemic steroid use Suspect chronic versus related to sepsis  continue midodrine 10 mg 3 times daily Continue hydrocortisone 5 mg, 3 tabs every morning 1 tablet every afternoon     Acute pharyngitis No evidence of thrush on exam Strep test normal Viral  respiratory testing normal   Otitis externa Recently diagnosed during admission to [facility] Continue ofloxacin 0.3% otic solution until 1/20   Recurrent UTI Recent E. coli UTI, hospitalized at [facility] from 1/10-1/15/25, on Keflex until 1/23 Hold Keflex while on IV antibiotics     Peripheral neuropathy Vitamin and mineral deficiencies (copper, zinc,Vitamin A, B6) Prior history of gastric bypass Continue nortriptyline and gabapentin Continue thiamine,  multivitamin, folic acid, ferrous sulfate, vitamin B12 vitamin D3 25, vitamin C   Chronic pain syndrome Chronic opioid use Continue Lidoderm patch, gabapentin 300 twice daily, oxycodone 10/325 every 4 as needed   Giant cell arteritis (HCC) Previously on prednisone Currently on hydrocortisone 5 mg 3 tablets in the a.m. 1 in the PM Followed by rheumatology at [facility]   HFrEF (heart failure with reduced ejection fraction) (HCC) LVEF 40-45% in Sept 2024 Clinically euvolemic GDMT limited by hypotension Continue Lasix 20 mg p.o. daily   History of pulmonary embolus 2020(PE) Chronic anticoagulation Continue Eliquis   Colostomy in place [facility]) Ostomy care   Morbid obesity with BMI of 45.0-49.9, adult (HCC) Complicating factor to overall prognosis and care   History of renal cell carcinoma No acute issues   Chronic obstructive pulmonary disease (COPD) (HCC) Not acutely exacerbated Continue home inhalers   Bilateral lower extremity edema secondary to hypoalbuminemia Patient with albumin level less than 1.5 Dietitian consulted   DVT prophylaxis: Eliquis   Consults: none   Advance Care Planning:   Code Status: Prior    Family Communication: none   Disposition Plan: Back to previous home environment   Subjective:  Patient seen and examined at bedside this morning Admits to improvement in her  general condition She admits to functional weakness and pains within the joints Denies nausea vomiting abdominal pain  Physical  Exam: General: Morbidly obese woman lying in bed not on oxygen HENT:     Head: Normocephalic and atraumatic.  Cardiovascular:     Rate and Rhythm: Regular rhythm. Tachycardia present.     Heart sounds: Normal heart sounds.  Pulmonary:     Effort: Tachypnea present.     Breath sounds: Normal breath sounds.  Abdominal:     Palpations: Abdomen is soft.     Tenderness: There is no abdominal tenderness.  Musculoskeletal:     Right lower leg: Edema present.     Left lower leg: Edema present.  Neurological:     Mental Status: Mental status is at baseline.    Data Reviewed: I have reviewed patient chest x-ray that did not show pneumonia    Latest Ref Rng & Units 01/31/2023    6:38 AM 01/30/2023    5:49 AM 01/29/2023   10:15 PM  CBC  WBC 4.0 - 10.5 K/uL 10.4  17.2  20.7   Hemoglobin 12.0 - 15.0 g/dL 8.9  8.5  9.3   Hematocrit 36.0 - 46.0 % 27.2  25.6  29.1   Platelets 150 - 400 K/uL 606  617  633        Latest Ref Rng & Units 01/31/2023    6:38 AM 01/30/2023    5:49 AM 01/29/2023   10:15 PM  BMP  Glucose 70 - 99 mg/dL 75  88  161   BUN 8 - 23 mg/dL 16  17  17    Creatinine 0.44 - 1.00 mg/dL 0.96  0.45  4.09   Sodium 135 - 145 mmol/L 135  135  137   Potassium 3.5 - 5.1 mmol/L 3.8  3.9  4.5   Chloride 98 - 111 mmol/L 108  107  109   CO2 22 - 32 mmol/L 21  22  20    Calcium 8.9 - 10.3 mg/dL 8.1  7.8  7.9     Vitals:   01/31/23 0700 01/31/23 0730 01/31/23 0800 01/31/23 0900  BP: 112/70  113/74 106/84  Pulse: (!) 105  (!) 103 74  Resp: 20  10 20   Temp:  98.5 F (36.9 C)    TempSrc:  Oral    SpO2: 100%  100% 95%  Weight:      Height:         Disposition: Pending medical stabilization  Time spent: 57 minutes taking care of patient, reviewing patient's CBGs, labs as well as admitting provider documentation discussing the plan of care with patient and patient's family, about 50% of this time was spent at bedside.  Author: Loyce Dys, MD 01/31/2023 1:45 PM  For on call  review [email or web address].

## 2023-01-31 NOTE — ED Notes (Signed)
ED TO INPATIENT HANDOFF REPORT  ED Nurse Name and Phone #: Kathryne Hitch [phone]  S Name/Age/Gender Wendy Sanchez 73 y.o. female Room/Bed: ED19A/ED19A  Code Status   Code Status: Full Code  Home/SNF/Other Home Patient oriented to: self, place, time, and situation Is this baseline? Yes   Triage Complete: Triage complete  Chief Complaint Sepsis [facility]) [A41.9]  Triage Note Pt to ed from [employer] via [facility] for code sepsis. Pt is caox4 and was sent over by facility for possible sepsis. Pt has been on abx of UTI for the last 3 days and hasn't been getting better.  EMS vitals: 90/50 T 100.2 RR 35 20G LAC HR 125 118 BGL   Allergies Allergies  Allergen Reactions   Hydrocodone-Acetaminophen Swelling    hands   Iodine Anaphylaxis and Swelling    Iv dye 02/03/21-Ispoke to patient and she had IV contrast  X4 last year and has had no problem. ( Last CT abdomen with contrast was on 01/02/21.-    Other Other (See Comments)    ALLERGY TO METAL - BLACKENS SKIN AND CAUSES A RASH    Tape Swelling and Other (See Comments)    Skin comes off.  Paper tape is ok tegaderm OK   Peanut Oil Other (See Comments)    ** Nuts cause runny nose   Shellfish Allergy Nausea And Vomiting and Swelling    Episode of GI infection after eating clam chowder. Still eats shrimp and other seafood    Level of Care/Admitting Diagnosis ED Disposition     ED Disposition  Admit   Condition  --   Comment  Hospital Area: [facility] [[id number]]  Level of Care: Progressive [102]  Admit to Progressive based on following criteria: MULTISYSTEM THREATS such as stable sepsis, metabolic/electrolyte imbalance with or without encephalopathy that is responding to early treatment.  Covid Evaluation: Asymptomatic - no recent exposure (last 10 days) testing not required  Diagnosis: Sepsis [facility]) [[id number]]  Admitting Physician: Andris Baumann [[id number]]  Attending Physician: Andris Baumann  [[id number]]  Certification:: I certify this patient will need inpatient services for at least 2 midnights  Expected Medical Readiness: 02/01/2023          B Medical/Surgery History Past Medical History:  Diagnosis Date   Allergic rhinitis    Arthritis    Asthma    problems with fumes and aerosols cause asthma   Chest pain 10/19/2016   CHF (congestive heart failure) (HCC)    Complication of anesthesia    awakens during surgery; has occurred with last 3-4 surgeries    COPD (chronic obstructive pulmonary disease) (HCC)    DVT (deep venous thrombosis) (HCC) 07/13/2018   Right leg   Environmental allergies    fumes    Fibromyalgia    GERD (gastroesophageal reflux disease)    Headache    History of bronchitis    Hx of total knee arthroplasty 12/13/2015   Hyperlipidemia    Hypertension    Morbid obesity with BMI of 45.0-49.9, adult (HCC) 07/25/2015   NICM (nonischemic cardiomyopathy) (HCC)    a. ? PVC mediated;  b. 06/2016 Echo: EF 25-30%, mild LVH, mild LAE, mild AI/MR/TR/PR; c. 08/2016 Cath: nl cors, EF 25%.   Numbness    hands bilat when driving; improves when not preforming task    Osteoarthritis of left knee 08/02/2015   PE (pulmonary thromboembolism) (HCC) 07/13/2018   Pes anserinus bursitis of left knee 05/18/2016   Presence of right artificial knee joint  05/18/2016   PVC (premature ventricular contraction) 06/04/2017   PVC's (premature ventricular contractions)    a. 11/2016 Amio started;  b. 11/29/16 48h Holter: 57574 PVC's (41%); c. 12/2016 24h Holter: 18040 PVC's (15%); c. 02/2017 48h Holter: 37262 PVC's (41%).   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   Temporal arteritis (HCC) 05/01/2020   Tinnitus    comes and goes    Unilateral primary osteoarthritis, right knee 12/13/2015   Vertigo    none for over 2 yrs   Past Surgical History:   Procedure Laterality Date   ABDOMINAL HYSTERECTOMY  1979   left ovary remains   ABDOMINAL HYSTERECTOMY     ARTERY BIOPSY Left 06/07/2019   Procedure: BIOPSY TEMPORAL ARTERY;  Surgeon: Annice Needy, MD;  Location: [facility] ORS;  Service: Vascular;  Laterality: Left;   CHOLECYSTECTOMY     COLON SURGERY     COLONOSCOPY     COLONOSCOPY N/A 10/30/2019   Procedure: COLONOSCOPY;  Surgeon: Regis Bill, MD;  Location: [facility] ENDOSCOPY;  Service: Endoscopy;  Laterality: N/A;   ESOPHAGOGASTRODUODENOSCOPY N/A 10/30/2019   Procedure: ESOPHAGOGASTRODUODENOSCOPY (EGD);  Surgeon: Regis Bill, MD;  Location: [facility] ENDOSCOPY;  Service: Endoscopy;  Laterality: N/A;   fibrous tissue removed from right shoulder and back of neck      2 years ago    FLOOR OF MOUTH BIOPSY N/A 02/28/2019   Procedure: SALIVARY GLAND BIOPSY;  Surgeon: Bud Face, MD;  Location: [facility] ORS;  Service: ENT;  Laterality: N/A;   FRACTURE SURGERY     left small finger   GANGLION CYST EXCISION     GASTRIC BYPASS  1980   states had bypass with banding and band left in -   GASTRIC BYPASS OPEN     JOINT REPLACEMENT Bilateral 2017   knees   KNEE SURGERY Bilateral    both knees  2001   LAPAROSCOPIC SIGMOID COLECTOMY Left 09/13/2020   at [facility] for a perforated sigmoid diverticulitis   NECK SURGERY N/A 1205/19   ganglion cyst removal, benigh    PARTIAL COLECTOMY  09/2020   PULMONARY THROMBECTOMY Bilateral 07/14/2018   Procedure: PULMONARY THROMBECTOMY;  Surgeon: Annice Needy, MD;  Location: [facility] INVASIVE CV LAB;  Service: Cardiovascular;  Laterality: Bilateral;   PVC ABLATION N/A 06/04/2017   Procedure: PVC ABLATION;  Surgeon: Marinus Maw, MD;  Location: [facility] INVASIVE CV LAB;  Service: Cardiovascular;  Laterality: N/A;   RIGHT/LEFT HEART CATH AND CORONARY ANGIOGRAPHY Bilateral 08/12/2016   Procedure: Right/Left Heart Cath and Coronary Angiography;  Surgeon: Alwyn Pea, MD;  Location: [facility] INVASIVE CV LAB;   Service: Cardiovascular;  Laterality: Bilateral;   TONSILLECTOMY     age 15   TOTAL KNEE ARTHROPLASTY Left 08/02/2015   Procedure: LEFT TOTAL KNEE ARTHROPLASTY;  Surgeon: Kathryne Hitch, MD;  Location: [facility];  Service: Orthopedics;  Laterality: Left;   TOTAL KNEE ARTHROPLASTY Right 12/13/2015   Procedure: RIGHT TOTAL KNEE ARTHROPLASTY;  Surgeon: Kathryne Hitch, MD;  Location: [facility];  Service: Orthopedics;  Laterality: Right;     A IV Location/Drains/Wounds Patient Lines/Drains/Airways Status     Active Line/Drains/Airways     Name Placement date Placement time Site Days   Peripheral IV 01/29/23 20 G Left;Anterior Antecubital 01/29/23  2234  Antecubital  2   Peripheral IV 01/29/23 20 G Posterior;Right Hand  01/29/23  2244  Hand  2   Colostomy LLQ 11/09/20  1800  LLQ  813   Incision (Closed) 11/08/20 Abdomen Lower;Medial 11/08/20  0219  -- 814            Intake/Output Last 24 hours  Intake/Output Summary (Last 24 hours) at 01/31/2023 1346 Last data filed at 01/31/2023 0310 Gross per 24 hour  Intake 2403 ml  Output --  Net 2403 ml    Labs/Imaging Results for orders placed or performed during the hospital encounter of 01/29/23 (from the past 48 hours)  Lactic acid, plasma     Status: None   Collection Time: 01/29/23 10:15 PM  Result Value Ref Range   Lactic Acid, Venous 1.2 0.5 - 1.9 mmol/L    Comment: Performed at [facility], [address] [place], [place] [address]  Comprehensive metabolic panel     Status: Abnormal   Collection Time: 01/29/23 10:15 PM  Result Value Ref Range   Sodium 137 135 - 145 mmol/L   Potassium 4.5 3.5 - 5.1 mmol/L    Comment: HEMOLYSIS AT THIS LEVEL MAY AFFECT RESULT   Chloride 109 98 - 111 mmol/L   CO2 20 (L) 22 - 32 mmol/L   Glucose, Bld 104 (H) 70 - 99 mg/dL    Comment: Glucose reference range applies only to samples taken after fasting for at least 8 hours.   BUN 17 8 - 23 mg/dL   Creatinine, Ser 1.91 0.44 -  1.00 mg/dL   Calcium 7.9 (L) 8.9 - 10.3 mg/dL   Total Protein 5.0 (L) 6.5 - 8.1 g/dL   Albumin <4.7 (L) 3.5 - 5.0 g/dL   AST 33 15 - 41 U/L    Comment: HEMOLYSIS AT THIS LEVEL MAY AFFECT RESULT   ALT 23 0 - 44 U/L    Comment: HEMOLYSIS AT THIS LEVEL MAY AFFECT RESULT   Alkaline Phosphatase 135 (H) 38 - 126 U/L   Total Bilirubin 0.8 0.0 - 1.2 mg/dL    Comment: HEMOLYSIS AT THIS LEVEL MAY AFFECT RESULT   GFR, Estimated >60 >60 mL/min    Comment: (NOTE) Calculated using the CKD-EPI Creatinine Equation (2021)    Anion gap 8 5 - 15    Comment: Performed at [facility], [address] [place], [place] [address]  CBC with Differential     Status: Abnormal   Collection Time: 01/29/23 10:15 PM  Result Value Ref Range   WBC 20.7 (H) 4.0 - 10.5 K/uL   RBC 3.31 (L) 3.87 - 5.11 MIL/uL   Hemoglobin 9.3 (L) 12.0 - 15.0 g/dL   HCT 21.3 (L) 08.6 - 57.8 %   MCV 87.9 80.0 - 100.0 fL   MCH 28.1 26.0 - 34.0 pg   MCHC 32.0 30.0 - 36.0 g/dL   RDW 46.9 (H) 62.9 - 52.8 %   Platelets 633 (H) 150 - 400 K/uL   nRBC 0.0 0.0 - 0.2 %   Neutrophils Relative % 89 %   Neutro Abs 18.4 (H) 1.7 - 7.7 K/uL   Lymphocytes Relative 5 %   Lymphs Abs 1.0 0.7 - 4.0 K/uL   Monocytes Relative 2 %   Monocytes Absolute 0.4 0.1 - 1.0 K/uL   Eosinophils Relative 3 %   Eosinophils Absolute 0.7 (H) 0.0 - 0.5 K/uL   Basophils Relative 0 %   Basophils Absolute 0.1 0.0 - 0.1 K/uL   Immature Granulocytes 1 %   Abs Immature Granulocytes 0.22 (H) 0.00 - 0.07  K/uL    Comment: Performed at [facility], [address] [place], [place] [address]  Urinalysis, w/ Reflex to Culture (Infection Suspected) -Urine, Clean Catch     Status: Abnormal   Collection Time: 01/29/23 10:38 PM  Result Value Ref Range   Specimen Source URINE, CLEAN CATCH    Color, Urine AMBER (A) YELLOW    Comment: BIOCHEMICALS MAY BE AFFECTED BY COLOR   APPearance HAZY (A) CLEAR   Specific Gravity, Urine 1.024 1.005 - 1.030   pH 5.0 5.0  - 8.0   Glucose, UA NEGATIVE NEGATIVE mg/dL   Hgb urine dipstick NEGATIVE NEGATIVE   Bilirubin Urine NEGATIVE NEGATIVE   Ketones, ur NEGATIVE NEGATIVE mg/dL   Protein, ur 30 (A) NEGATIVE mg/dL   Nitrite NEGATIVE NEGATIVE   Leukocytes,Ua TRACE (A) NEGATIVE   RBC / HPF 0-5 0 - 5 RBC/hpf   WBC, UA 11-20 0 - 5 WBC/hpf    Comment:        Reflex urine culture not performed if WBC <=10, OR if Squamous epithelial cells >5. If Squamous epithelial cells >5 suggest recollection.    Bacteria, UA NONE SEEN NONE SEEN   Squamous Epithelial / HPF 0-5 0 - 5 /HPF   Mucus PRESENT     Comment: Performed at [facility], [address] [place], [place] [address]  Urine Culture     Status: None   Collection Time: 01/29/23 10:38 PM   Specimen: Urine, Random  Result Value Ref Range   Specimen Description      URINE, RANDOM Performed at [facility], [address], [place], [place] [address]    Special Requests      NONE Reflexed from [id number] Performed at [facility], [address] [place] [place], [place] [address]    Culture      NO GROWTH Performed at [facility], [address], [place], [place] [address]    Report Status 01/31/2023 FINAL   Blood culture (routine x 2)     Status: None (Preliminary result)   Collection Time: 01/29/23 10:48 PM   Specimen: BLOOD RIGHT HAND  Result Value Ref Range   Specimen Description BLOOD RIGHT HAND    Special Requests      BOTTLES DRAWN AEROBIC AND ANAEROBIC Blood Culture results may not be optimal due to an inadequate volume of blood received in culture bottles   Culture      NO GROWTH 2 DAYS Performed at [facility], [address] [place], [place] [address]    Report Status PENDING   Blood culture (routine x 2)     Status: None (Preliminary result)   Collection Time: 01/29/23 10:53 PM   Specimen: BLOOD  Result Value Ref Range   Specimen Description BLOOD BLOOD LEFT HAND    Special Requests       BOTTLES DRAWN AEROBIC AND ANAEROBIC Blood Culture results may not be optimal due to an inadequate volume of blood received in culture bottles   Culture      NO GROWTH 2 DAYS Performed at [facility], [address] [place], [place] [address]    Report Status PENDING   Resp panel by RT-PCR (RSV, Flu A&B, Covid) Anterior Nasal Swab     Status: None   Collection Time: 01/29/23 11:31 PM   Specimen: Anterior Nasal Swab  Result Value Ref Range   SARS Coronavirus 2 by RT PCR NEGATIVE NEGATIVE    Comment: (NOTE) SARS-CoV-2 target nucleic acids  are NOT DETECTED.  The SARS-CoV-2 RNA is generally detectable in upper respiratory specimens during the acute phase of infection. The lowest concentration of SARS-CoV-2 viral copies this assay can detect is 138 copies/mL. A negative result does not preclude SARS-Cov-2 infection and should not be used as the sole basis for treatment or other patient management decisions. A negative result may occur with  improper specimen collection/handling, submission of specimen other than nasopharyngeal swab, presence of viral mutation(s) within the areas targeted by this assay, and inadequate number of viral copies(<138 copies/mL). A negative result must be combined with clinical observations, patient history, and epidemiological information. The expected result is Negative.  Fact Sheet for Patients:  BloggerCourse.com  Fact Sheet for Healthcare Providers:  SeriousBroker.it  This test is no t yet approved or cleared by the [place] FDA and  has been authorized for detection and/or diagnosis of SARS-CoV-2 by FDA under an Emergency Use Authorization (EUA). This EUA will remain  in effect (meaning this test can be used) for the duration of the COVID-19 declaration under Section 564(b)(1) of the Act, 21 U.S.C.section 360bbb-3(b)(1), unless the authorization is terminated  or revoked sooner.        Influenza A by PCR NEGATIVE NEGATIVE   Influenza B by PCR NEGATIVE NEGATIVE    Comment: (NOTE) The Xpert Xpress SARS-CoV-2/FLU/RSV plus assay is intended as an aid in the diagnosis of influenza from Nasopharyngeal swab specimens and should not be used as a sole basis for treatment. Nasal washings and aspirates are unacceptable for Xpert Xpress SARS-CoV-2/FLU/RSV testing.  Fact Sheet for Patients: BloggerCourse.com  Fact Sheet for Healthcare Providers: SeriousBroker.it  This test is not yet approved or cleared by the [place] FDA and has been authorized for detection and/or diagnosis of SARS-CoV-2 by FDA under an Emergency Use Authorization (EUA). This EUA will remain in effect (meaning this test can be used) for the duration of the COVID-19 declaration under Section 564(b)(1) of the Act, 21 U.S.C. section 360bbb-3(b)(1), unless the authorization is terminated or revoked.     Resp Syncytial Virus by PCR NEGATIVE NEGATIVE    Comment: (NOTE) Fact Sheet for Patients: BloggerCourse.com  Fact Sheet for Healthcare Providers: SeriousBroker.it  This test is not yet approved or cleared by the [place] FDA and has been authorized for detection and/or diagnosis of SARS-CoV-2 by FDA under an Emergency Use Authorization (EUA). This EUA will remain in effect (meaning this test can be used) for the duration of the COVID-19 declaration under Section 564(b)(1) of the Act, 21 U.S.C. section 360bbb-3(b)(1), unless the authorization is terminated or revoked.  Performed at [facility], [address] [place], [place] [address]   Group A Strep by PCR     Status: None   Collection Time: 01/30/23  1:57 AM   Specimen: Throat; Sterile Swab  Result Value Ref Range   Group A Strep by PCR NOT DETECTED NOT DETECTED    Comment: Performed at [facility], [address] [place], [place] [address]  Comprehensive metabolic panel     Status: Abnormal   Collection Time: 01/30/23  5:49 AM  Result Value Ref Range   Sodium 135 135 - 145 mmol/L   Potassium 3.9 3.5 - 5.1 mmol/L   Chloride 107 98 - 111 mmol/L   CO2 22 22 - 32 mmol/L   Glucose, Bld 88 70 - 99 mg/dL    Comment: Glucose reference range applies only to samples taken after fasting for at least 8 hours.  BUN 17 8 - 23 mg/dL   Creatinine, Ser 2.13 0.44 - 1.00 mg/dL   Calcium 7.8 (L) 8.9 - 10.3 mg/dL   Total Protein 4.6 (L) 6.5 - 8.1 g/dL   Albumin <0.8 (L) 3.5 - 5.0 g/dL   AST 23 15 - 41 U/L   ALT 21 0 - 44 U/L   Alkaline Phosphatase 127 (H) 38 - 126 U/L   Total Bilirubin 0.6 0.0 - 1.2 mg/dL   GFR, Estimated >65 >78 mL/min    Comment: (NOTE) Calculated using the CKD-EPI Creatinine Equation (2021)    Anion gap 6 5 - 15    Comment: Performed at [facility], [address] [place], [place] [address]  CBC     Status: Abnormal   Collection Time: 01/30/23  5:49 AM  Result Value Ref Range   WBC 17.2 (H) 4.0 - 10.5 K/uL   RBC 3.00 (L) 3.87 - 5.11 MIL/uL   Hemoglobin 8.5 (L) 12.0 - 15.0 g/dL   HCT 95.2 (L) 84.1 - 32.4 %   MCV 85.3 80.0 - 100.0 fL   MCH 28.3 26.0 - 34.0 pg   MCHC 33.2 30.0 - 36.0 g/dL   RDW 40.1 (H) 02.7 - 25.3 %   Platelets 617 (H) 150 - 400 K/uL   nRBC 0.0 0.0 - 0.2 %    Comment: Performed at [facility], [address] [place], [place] [address]  Procalcitonin     Status: None   Collection Time: 01/30/23  5:49 AM  Result Value Ref Range   Procalcitonin 0.78 ng/mL    Comment:        Interpretation: PCT > 0.5 ng/mL and <= 2 ng/mL: Systemic infection (sepsis) is possible, but other conditions are known to elevate PCT as well. (NOTE)       Sepsis PCT Algorithm           Lower Respiratory Tract                                      Infection PCT Algorithm    ----------------------------     ----------------------------         PCT < 0.25 ng/mL                 PCT < 0.10 ng/mL          Strongly encourage             Strongly discourage   discontinuation of antibiotics    initiation of antibiotics    ----------------------------     -----------------------------       PCT 0.25 - 0.50 ng/mL            PCT 0.10 - 0.25 ng/mL               OR       >80% decrease in PCT            Discourage initiation of                                            antibiotics      Encourage discontinuation           of antibiotics    ----------------------------     -----------------------------  PCT >= 0.50 ng/mL              PCT 0.26 - 0.50 ng/mL                AND       <80% decrease in PCT             Encourage initiation of                                             antibiotics       Encourage continuation           of antibiotics    ----------------------------     -----------------------------        PCT >= 0.50 ng/mL                  PCT > 0.50 ng/mL               AND         increase in PCT                  Strongly encourage                                      initiation of antibiotics    Strongly encourage escalation           of antibiotics                                     -----------------------------                                           PCT <= 0.25 ng/mL                                                 OR                                        > 80% decrease in PCT                                      Discontinue / Do not initiate                                             antibiotics  Performed at [facility], [address] [place], [place] [address]   CBC with Differential/Platelet     Status: Abnormal   Collection Time: 01/31/23  6:38 AM  Result Value Ref Range   WBC 10.4 4.0 - 10.5 K/uL   RBC 3.12 (L) 3.87 - 5.11 MIL/uL   Hemoglobin 8.9 (L) 12.0 - 15.0 g/dL  HCT 27.2 (L) 36.0 - 46.0 %   MCV 87.2 80.0 - 100.0 fL   MCH 28.5 26.0 - 34.0 pg   MCHC 32.7 30.0 - 36.0 g/dL   RDW 40.9 (H) 81.1 -  15.5 %   Platelets 606 (H) 150 - 400 K/uL   nRBC 0.0 0.0 - 0.2 %   Neutrophils Relative % 81 %   Neutro Abs 8.3 (H) 1.7 - 7.7 K/uL   Lymphocytes Relative 8 %   Lymphs Abs 0.8 0.7 - 4.0 K/uL   Monocytes Relative 3 %   Monocytes Absolute 0.3 0.1 - 1.0 K/uL   Eosinophils Relative 7 %   Eosinophils Absolute 0.8 (H) 0.0 - 0.5 K/uL   Basophils Relative 0 %   Basophils Absolute 0.0 0.0 - 0.1 K/uL   Immature Granulocytes 1 %   Abs Immature Granulocytes 0.15 (H) 0.00 - 0.07 K/uL    Comment: Performed at [facility], [address] [place], [place] [address]  Basic metabolic panel     Status: Abnormal   Collection Time: 01/31/23  6:38 AM  Result Value Ref Range   Sodium 135 135 - 145 mmol/L   Potassium 3.8 3.5 - 5.1 mmol/L   Chloride 108 98 - 111 mmol/L   CO2 21 (L) 22 - 32 mmol/L   Glucose, Bld 75 70 - 99 mg/dL    Comment: Glucose reference range applies only to samples taken after fasting for at least 8 hours.   BUN 16 8 - 23 mg/dL   Creatinine, Ser 2.95 0.44 - 1.00 mg/dL   Calcium 8.1 (L) 8.9 - 10.3 mg/dL   GFR, Estimated >62 >13 mL/min    Comment: (NOTE) Calculated using the CKD-EPI Creatinine Equation (2021)    Anion gap 6 5 - 15    Comment: Performed at [facility], [address] [place], [place] [address]   DG Chest Portable 1 View Result Date: 01/29/2023 CLINICAL DATA:  Possible sepsis. On antibiotics for UTI. Not improving. EXAM: PORTABLE CHEST 1 VIEW COMPARISON:  01/30/2021 FINDINGS: Stable cardiomegaly. Aortic atherosclerotic calcification. Left basilar atelectasis. No focal consolidation, pleural effusion, or pneumothorax. No displaced rib fractures. IMPRESSION: No active disease. Electronically Signed   By: Minerva Fester M.D.   On: 01/29/2023 23:06    Pending Labs Unresulted Labs (From admission, onward)     Start     Ordered   01/31/23 0500  CBC with Differential/Platelet  Daily,   R      01/30/23 1647   01/31/23 0500  Basic metabolic panel   Daily,   R      01/30/23 1647            Vitals/Pain Today's Vitals   01/31/23 0800 01/31/23 0900 01/31/23 1200 01/31/23 1300  BP: 113/74 106/84 100/66 (!) 156/125  Pulse: (!) 103 74 (!) 106 (!) 111  Resp: 10 20 (!) 23 (!) 25  Temp:      TempSrc:      SpO2: 100% 95% 100% 100%  Weight:      Height:      PainSc:        Isolation Precautions No active isolations  Medications Medications  lactated ringers infusion (0 mLs Intravenous Stopped 01/30/23 1527)  furosemide (LASIX) tablet 20 mg (20 mg Oral Given 01/31/23 0918)  potassium chloride SA (KLOR-CON M) CR tablet 20 mEq (20 mEq Oral Given 01/31/23 0917)  nortriptyline (PAMELOR) capsule 50 mg (50 mg Oral Given 01/30/23 2221)  hydrocortisone (CORTEF) tablet  15 mg (15 mg Oral Given 01/31/23 0931)  hydrocortisone (CORTEF) tablet 5 mg (5 mg Oral Given 01/30/23 1744)  polyethylene glycol (MIRALAX / GLYCOLAX) packet 17 g (has no administration in time range)  apixaban (ELIQUIS) tablet 5 mg (5 mg Oral Given 01/31/23 0918)  ferrous sulfate tablet 325 mg (325 mg Oral Given 01/31/23 0729)  gabapentin (NEURONTIN) capsule 300 mg (300 mg Oral Given 01/31/23 0918)  calcium carbonate (OS-CAL - dosed in mg of elemental calcium) tablet 2,500 mg (2,500 mg Oral Given 01/31/23 0729)  ipratropium-albuterol (DUONEB) 0.5-2.5 (3) MG/3ML nebulizer solution 3 mL (has no administration in time range)  nystatin cream (MYCOSTATIN) 1 Application (1 Application Topical Given 01/31/23 0919)  midodrine (PROAMATINE) tablet 10 mg (10 mg Oral Given 01/31/23 1100)  ofloxacin (OCUFLOX) 0.3 % ophthalmic solution 5 drop (5 drops Both EARS Given 01/31/23 0919)  thiamine (VITAMIN B1) tablet 100 mg (100 mg Oral Given 01/31/23 0931)  cyanocobalamin (VITAMIN B12) tablet 1,000 mcg (1,000 mcg Oral Given 01/31/23 0917)  cholecalciferol (VITAMIN D3) 25 MCG (1000 UNIT) tablet 1,000 Units (1,000 Units Oral Given 01/31/23 0918)  folic acid (FOLVITE) tablet 1 mg (1 mg Oral Given 01/31/23  0917)  metroNIDAZOLE (FLAGYL) IVPB 500 mg (0 mg Intravenous Stopped 01/31/23 0310)  acetaminophen (TYLENOL) tablet 650 mg (has no administration in time range)    Or  acetaminophen (TYLENOL) suppository 650 mg (has no administration in time range)  ondansetron (ZOFRAN) tablet 4 mg (has no administration in time range)    Or  ondansetron (ZOFRAN) injection 4 mg (has no administration in time range)  docusate sodium (COLACE) capsule 100 mg (100 mg Oral Given 01/31/23 0917)  melatonin tablet 5 mg (has no administration in time range)  oxyCODONE-acetaminophen (PERCOCET/ROXICET) 5-325 MG per tablet 1 tablet (1 tablet Oral Given 01/31/23 0617)    And  oxyCODONE (Oxy IR/ROXICODONE) immediate release tablet 5 mg (5 mg Oral Given 01/31/23 0618)  ceFEPIme (MAXIPIME) 2 g in sodium chloride 0.9 % 100 mL IVPB (0 g Intravenous Stopped 01/31/23 0718)  vancomycin (VANCOREADY) IVPB 1750 mg/350 mL (0 mg Intravenous Stopped 01/31/23 0601)  lactated ringers bolus 1,000 mL (0 mLs Intravenous Stopped 01/29/23 2331)  ceFEPIme (MAXIPIME) 2 g in sodium chloride 0.9 % 100 mL IVPB (0 g Intravenous Stopped 01/29/23 2324)  vancomycin (VANCOCIN) IVPB 1000 mg/200 mL premix (0 mg Intravenous Stopped 01/30/23 0002)  acetaminophen (TYLENOL) tablet 1,000 mg (1,000 mg Oral Given 01/29/23 2334)  vancomycin (VANCOREADY) IVPB 1500 mg/300 mL (0 mg Intravenous Stopped 01/30/23 0404)    Mobility non-ambulatory     Focused Assessments     R Recommendations: See Admitting Provider Note  Report given to:   Additional Notes:

## 2023-01-31 NOTE — ED Notes (Signed)
 Patient is eating lunch.

## 2023-01-31 NOTE — Evaluation (Signed)
Clinical/Bedside Swallow Evaluation Patient Details  Name: Wendy Sanchez MRN: [id number] Date of Birth: 01-May-1950  Today's Date: 01/31/2023 Time: SLP Start Time (ACUTE ONLY): 1005 SLP Stop Time (ACUTE ONLY): 1055 SLP Time Calculation (min) (ACUTE ONLY): 50 min  Past Medical History:  Past Medical History:  Diagnosis Date   Allergic rhinitis    Arthritis    Asthma    problems with fumes and aerosols cause asthma   Chest pain 10/19/2016   CHF (congestive heart failure) (HCC)    Complication of anesthesia    awakens during surgery; has occurred with last 3-4 surgeries    COPD (chronic obstructive pulmonary disease) (HCC)    DVT (deep venous thrombosis) (HCC) 07/13/2018   Right leg   Environmental allergies    fumes    Fibromyalgia    GERD (gastroesophageal reflux disease)    Headache    History of bronchitis    Hx of total knee arthroplasty 12/13/2015   Hyperlipidemia    Hypertension    Morbid obesity with BMI of 45.0-49.9, adult (HCC) 07/25/2015   NICM (nonischemic cardiomyopathy) (HCC)    a. ? PVC mediated;  b. 06/2016 Echo: EF 25-30%, mild LVH, mild LAE, mild AI/MR/TR/PR; c. 08/2016 Cath: nl cors, EF 25%.   Numbness    hands bilat when driving; improves when not preforming task    Osteoarthritis of left knee 08/02/2015   PE (pulmonary thromboembolism) (HCC) 07/13/2018   Pes anserinus bursitis of left knee 05/18/2016   Presence of right artificial knee joint 05/18/2016   PVC (premature ventricular contraction) 06/04/2017   PVC's (premature ventricular contractions)    a. 11/2016 Amio started;  b. 11/29/16 48h Holter: 57574 PVC's (41%); c. 12/2016 24h Holter: 18040 PVC's (15%); c. 02/2017 48h Holter: 37262 PVC's (41%).   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   Temporal arteritis (HCC) 05/01/2020   Tinnitus    comes and goes    Unilateral  primary osteoarthritis, right knee 12/13/2015   Vertigo    none for over 2 yrs   Past Surgical History:  Past Surgical History:  Procedure Laterality Date   ABDOMINAL HYSTERECTOMY  1979   left ovary remains   ABDOMINAL HYSTERECTOMY     ARTERY BIOPSY Left 06/07/2019   Procedure: BIOPSY TEMPORAL ARTERY;  Surgeon: Annice Needy, MD;  Location: [facility] ORS;  Service: Vascular;  Laterality: Left;   CHOLECYSTECTOMY     COLON SURGERY     COLONOSCOPY     COLONOSCOPY N/A 10/30/2019   Procedure: COLONOSCOPY;  Surgeon: Regis Bill, MD;  Location: [facility] ENDOSCOPY;  Service: Endoscopy;  Laterality: N/A;   ESOPHAGOGASTRODUODENOSCOPY N/A 10/30/2019   Procedure: ESOPHAGOGASTRODUODENOSCOPY (EGD);  Surgeon: Regis Bill, MD;  Location: [facility] ENDOSCOPY;  Service: Endoscopy;  Laterality: N/A;   fibrous tissue removed from right shoulder and back of neck      2 years ago    FLOOR OF MOUTH BIOPSY N/A 02/28/2019   Procedure: SALIVARY GLAND BIOPSY;  Surgeon: Bud Face, MD;  Location: [facility] ORS;  Service: ENT;  Laterality: N/A;   FRACTURE SURGERY     left small finger   GANGLION CYST EXCISION     GASTRIC BYPASS  1980   states had bypass with banding and band left in -   GASTRIC BYPASS OPEN     JOINT REPLACEMENT Bilateral 2017  knees   KNEE SURGERY Bilateral    both knees  2001   LAPAROSCOPIC SIGMOID COLECTOMY Left 09/13/2020   at [facility] for a perforated sigmoid diverticulitis   NECK SURGERY N/A 1205/19   ganglion cyst removal, benigh    PARTIAL COLECTOMY  09/2020   PULMONARY THROMBECTOMY Bilateral 07/14/2018   Procedure: PULMONARY THROMBECTOMY;  Surgeon: Annice Needy, MD;  Location: [facility] INVASIVE CV LAB;  Service: Cardiovascular;  Laterality: Bilateral;   PVC ABLATION N/A 06/04/2017   Procedure: PVC ABLATION;  Surgeon: Marinus Maw, MD;  Location: [facility] INVASIVE CV LAB;  Service: Cardiovascular;  Laterality: N/A;   RIGHT/LEFT HEART CATH AND CORONARY ANGIOGRAPHY Bilateral 08/12/2016    Procedure: Right/Left Heart Cath and Coronary Angiography;  Surgeon: Alwyn Pea, MD;  Location: [facility] INVASIVE CV LAB;  Service: Cardiovascular;  Laterality: Bilateral;   TONSILLECTOMY     age 59   TOTAL KNEE ARTHROPLASTY Left 08/02/2015   Procedure: LEFT TOTAL KNEE ARTHROPLASTY;  Surgeon: Kathryne Hitch, MD;  Location: [facility] ORS;  Service: Orthopedics;  Laterality: Left;   TOTAL KNEE ARTHROPLASTY Right 12/13/2015   Procedure: RIGHT TOTAL KNEE ARTHROPLASTY;  Surgeon: Kathryne Hitch, MD;  Location: [facility] ORS;  Service: Orthopedics;  Laterality: Right;   HPI:  Wendy Sanchez is a 73 y.o. 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 coli UTI and RLL pneumonia, as well as otitis externa, discharged to SNF, who was sent from the facility via EMS for code sepsis.  With EMS, patient febrile to 100.2, tachycardic to 125 tachypneic to 35 and hypotensive to 90/50.  Patient complains of generalized weakness and having a sore throat for the past 3 days.  She denies cough, shortness of breath or chest pain, fever or chills and denies abdominal pain, nausea or vomiting.  Respiratory viral panel negative, UA unremarkable, chest x-ray clear.   CXR: No active disease. Last CXR in chart was 2023 which shpwed venous congestion.   Wendy Sanchez denied any difficulty swallowing just difficulty chewing foods d/t "I only have my front lower teeth".    Assessment / Plan / Recommendation  Clinical Impression   Wendy Sanchez seen for BSE this morning. Wendy Sanchez alert, verbal and conversed easily w/ this SLP. Followed all instructions. She endorsed she  was bedbound and used a hoyer lift to get into a chair baseline. She also endorsed she is missing most Dentition which "makes it hard to chew food".  On RA, afebrile. WBC WNL.   Wendy Sanchez appears to present w/ grossly functional oropharyngeal phase swallowing in setting of Missing MOST Dentition(few front lower teeth only) w/ No overt oropharyngeal phase dysphagia noted, No neuromuscular deficits noted. Wendy Sanchez consumed po trials w/ No overt, clinical s/s of aspiration during po trials.  Wendy Sanchez appears at reduced risk for aspiration/aspiration pneumonia when following general aspiration precautions w/ slightly modified diet of chopped meats/foods moistened for ease of Gumming.  However, Wendy Sanchez does have challenging factors that could impact her oropharyngeal swallowing to include sedentary/bedbound status, deconditioning/weakness, Missing Dentition, and min weakness in hands for self-feeding abilities("neuropathy" per Wendy Sanchez). These factors can increase risk for aspiration, dysphagia as well as decreased oral intake overall.  During  po trials, Wendy Sanchez consumed all consistencies w/ no overt coughing, decline in vocal quality, or change in respiratory presentation during/post trials. O2 sats remained 98%. Oral phase appeared grossly [facility] w/ timely bolus management, mashing/gumming of softened/moistened foods, and control of bolus propulsion for A-P transfer for swallowing. Oral clearing achieved w/ all trial consistencies -- the moistened, soft foods for ease of mashing/gumming were educated on(use of applesauce, soups, liquids to moisten). Also educated on chopped meats for ease. OM Exam appeared [facility] w/ no unilateral weakness noted. Speech Clear. Wendy Sanchez fed self w/ setup support.   Recommend a more Mech Soft consistency diet w/ well-Cut meats, moistened foods; Thin liquids -- monitor straw use, and Wendy Sanchez should be sitting Fully Upright for all drinking of liquids. Recommend general aspiration precautions, reduce distractions/talking during oral  intake. Positioning support for upright sitting in bed for oral intake. Pills WHOLE in Puree for safer, easier swallowing -- Wendy Sanchez was educated on this support strategy and agreed.  Education given on Pills in Puree; food consistencies and easy to eat options; general aspiration precautions to Wendy Sanchez. MD to reconsult if any new needs arise. NSG updated, agreed. MD updated. Recommend Dietician f/u for support. SLP Visit Diagnosis: Dysphagia, unspecified (R13.10) (Missing Dentition)    Aspiration Risk   (reduced following general aspiration precautions)    Diet Recommendation   Thin;Dysphagia 3 (mechanical soft) (cut/chopped meats/foods for ease of gumming d/t missing Dentition) = a more Mech Soft consistency diet w/ well-Cut meats, moistened foods; Thin liquids -- monitor straw use, and Wendy Sanchez should be sitting Fully Upright for all drinking of liquids. Recommend general aspiration precautions, reduce distractions/talking during oral intake. Positioning support for upright sitting in bed for oral intake.   Medication Administration: Whole meds with puree (if needed for ease of swallowing)    Other  Recommendations Recommended Consults:  (general reflux) Oral Care Recommendations: Oral care BID;Oral care before and after PO;Patient independent with oral care (setup)    Recommendations for follow up therapy are one component of a multi-disciplinary discharge planning process, led by the attending physician.  Recommendations may be updated based on patient status, additional functional criteria and insurance authorization.  Follow up Recommendations No SLP follow up      Assistance Recommended at Discharge  Intermittent  Functional Status Assessment Patient has not had a recent decline in their functional status  Frequency and Duration  (n/a)   (n/a)       Prognosis Prognosis for improved oropharyngeal function: Good Barriers/Prognosis Comment: missing Dentition Baseline      Swallow Study    General Date of Onset: 01/30/23 HPI: Wendy Sanchez is a 73 y.o. 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 coli UTI and RLL pneumonia, as well as otitis externa, discharged to SNF, who was sent from the facility via EMS for code sepsis.  With EMS, patient febrile to 100.2, tachycardic to 125 tachypneic to 35 and hypotensive to 90/50.  Patient complains of generalized weakness and having a sore throat for the past 3 days.  She denies cough, shortness of breath or chest pain, fever or chills and denies abdominal pain, nausea or vomiting.  Respiratory viral panel  negative, UA unremarkable, chest x-ray clear.   CXR: No active disease. Last CXR in chart was 2023 which shpwed venous congestion.   Wendy Sanchez denied any difficulty swallowing just difficulty chewing foods d/t "I only have my front lower teeth". Type of Study: Bedside Swallow Evaluation Previous Swallow Assessment: none Diet Prior to this Study: Regular;Thin liquids (Level 0) Temperature Spikes Noted: No (wbc 10.4) Respiratory Status: Room air History of Recent Intubation: No Behavior/Cognition: Alert;Cooperative;Pleasant mood Oral Cavity Assessment: Within Functional Limits Oral Care Completed by SLP: Recent completion by staff Oral Cavity - Dentition: Missing dentition (few front lower) Vision: Functional for self-feeding Self-Feeding Abilities: Able to feed self;Needs assist;Needs set up Patient Positioning: Upright in bed (MOD assist) Baseline Vocal Quality: Normal Volitional Cough: Strong Volitional Swallow: Able to elicit    Oral/Motor/Sensory Function  Overall Oral Motor/Sensory Function: Within functional limits   Ice Chips Ice chips: Within functional limits Presentation: Spoon (fed; 2 trials)   Thin Liquid Thin Liquid: Within functional limits Presentation: Self Fed;Straw (~4-5 ozs) Other Comments: water, juice    Nectar Thick Nectar Thick Liquid: Not tested   Honey Thick Honey Thick Liquid: Not tested   Puree Puree: Within functional limits Presentation: Self Fed;Spoon (7+ trials)   Solid     Solid:  (mising Dentition for mastication) Presentation: Self Fed;Spoon (8 trials) Other Comments: moistened well to gum        Jerilynn Som, MS, CCC-SLP Speech Language Pathologist Rehab Services; [facility] [place] [phone] (ascom) Aryaa Bunting 01/31/2023,11:24 AM

## 2023-02-01 DIAGNOSIS — R7881 Bacteremia: Secondary | ICD-10-CM | POA: Diagnosis not present

## 2023-02-01 LAB — BASIC METABOLIC PANEL
Anion gap: 6 (ref 5–15)
BUN: 16 mg/dL (ref 8–23)
CO2: 21 mmol/L — ABNORMAL LOW (ref 22–32)
Calcium: 8.2 mg/dL — ABNORMAL LOW (ref 8.9–10.3)
Chloride: 108 mmol/L (ref 98–111)
Creatinine, Ser: 0.61 mg/dL (ref 0.44–1.00)
GFR, Estimated: >60 mL/min (ref >60)
Glucose, Bld: 74 mg/dL (ref 70–99)
Potassium: 4.4 mmol/L (ref 3.5–5.1)
Sodium: 135 mmol/L (ref 135–145)

## 2023-02-01 LAB — CBC WITH DIFFERENTIAL/PLATELET
Abs Immature Granulocytes: 0.12 10*3/uL — ABNORMAL HIGH (ref 0.00–0.07)
Basophils Absolute: 0 10*3/uL (ref 0.0–0.1)
Basophils Relative: 0 %
Eosinophils Absolute: 0.6 10*3/uL — ABNORMAL HIGH (ref 0.0–0.5)
Eosinophils Relative: 7 %
HCT: 25.8 % — ABNORMAL LOW (ref 36.0–46.0)
Hemoglobin: 8.5 g/dL — ABNORMAL LOW (ref 12.0–15.0)
Immature Granulocytes: 1 %
Lymphocytes Relative: 13 %
Lymphs Abs: 1.1 10*3/uL (ref 0.7–4.0)
MCH: 27.7 pg (ref 26.0–34.0)
MCHC: 32.9 g/dL (ref 30.0–36.0)
MCV: 84 fL (ref 80.0–100.0)
Monocytes Absolute: 0.3 10*3/uL (ref 0.1–1.0)
Monocytes Relative: 3 %
Neutro Abs: 6.2 10*3/uL (ref 1.7–7.7)
Neutrophils Relative %: 76 %
Platelets: 619 10*3/uL — ABNORMAL HIGH (ref 150–400)
RBC: 3.07 MIL/uL — ABNORMAL LOW (ref 3.87–5.11)
RDW: 15.9 % — ABNORMAL HIGH (ref 11.5–15.5)
WBC: 8.3 10*3/uL (ref 4.0–10.5)
nRBC: 0 % (ref 0.0–0.2)

## 2023-02-01 LAB — IRON: Iron: 16 ug/dL — ABNORMAL LOW (ref 28–170)

## 2023-02-01 LAB — VITAMIN D 25 HYDROXY (VIT D DEFICIENCY, FRACTURES): Vit D, 25-Hydroxy: 52.05 ng/mL (ref 30–100)

## 2023-02-01 LAB — VITAMIN B12: Vitamin B-12: 1780 pg/mL — ABNORMAL HIGH (ref 180–914)

## 2023-02-01 LAB — FOLATE: Folate: 34 ng/mL (ref >5.9)

## 2023-02-01 MED ORDER — ACETAMINOPHEN 650 MG RE SUPP: 650.0000 mg | Freq: Four times a day (QID) | RECTAL | Status: DC | PRN

## 2023-02-01 MED ORDER — SODIUM CHLORIDE 0.9 % IV SOLN
2.0000 g | INTRAVENOUS | Status: AC
  Administered 2023-02-01 – 2023-02-03 (×3): 2 g via INTRAVENOUS
  Filled 2023-02-01 (×3): qty 20

## 2023-02-01 MED ORDER — ADULT MULTIVITAMIN W/MINERALS CH
1.0000 | ORAL_TABLET | Freq: Every day | ORAL | Status: DC
  Administered 2023-02-01 – 2023-02-04 (×4): 1 via ORAL
  Filled 2023-02-01 (×4): qty 1

## 2023-02-01 MED ORDER — BOOST PLUS PO LIQD
237.0000 mL | Freq: Three times a day (TID) | ORAL | Status: DC
  Administered 2023-02-01 – 2023-02-04 (×7): 237 mL via ORAL
  Filled 2023-02-01: qty 237

## 2023-02-01 MED ORDER — ACETAMINOPHEN 325 MG PO TABS
650.0000 mg | ORAL_TABLET | Freq: Four times a day (QID) | ORAL | Status: DC | PRN
  Administered 2023-02-03: 650 mg via ORAL
  Filled 2023-02-01: qty 2

## 2023-02-01 MED ORDER — FLUCONAZOLE 50 MG PO TABS
150.0000 mg | ORAL_TABLET | Freq: Once | ORAL | Status: AC
  Administered 2023-02-01: 150 mg via ORAL
  Filled 2023-02-01: qty 1

## 2023-02-01 NOTE — Evaluation (Signed)
Occupational Therapy Evaluation Patient Details Name: Wendy Sanchez MRN: [id number] DOB: 1950/10/18 Today's Date: 02/01/2023   History of Present Illness Wendy Sanchez is a 73 y.o. 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 coli UTI and RLL pneumonia, as well as otitis externa, discharged to SNF, who was sent from the facility via EMS for code sepsis.   Clinical Impression   Ms Brooker was seen for OT evaluation this date. Prior to hospital admission, pt was requiring assist st home for [facility] t/fs, primarily bed bound >1 month. Pt lives alone. Pt currently requires MAX A exit bed and MIN A static sitting. MAX A don B socks in sitting, SETUP bed level self-feeding. MAX A x2 return to bed. Pt would benefit from skilled OT to address noted impairments and functional limitations (see below for any additional details). Upon hospital discharge, recommend OT follow up <3 hours/day.      If plan is discharge home, recommend the following: Two people to help with walking and/or transfers;Two people to help with bathing/dressing/bathroom    Functional Status Assessment  Patient has had a recent decline in their functional status and demonstrates the ability to make significant improvements in function in a reasonable and predictable amount of time.  Equipment Recommendations  Hospital bed;Hoyer lift    Recommendations for Other Services       Precautions / Restrictions Precautions Precautions: Fall Restrictions Weight Bearing Restrictions Per Provider Order: No      Mobility Bed Mobility Overal bed mobility:  Needs Assistance Bed Mobility: Supine to Sit, Sit to Supine     Supine to sit: Max assist Sit to supine: Max assist, +2 for physical assistance        Transfers                   General transfer comment: unsafe to attempt poor sitting toelrance/balance      Balance Overall balance assessment: Needs assistance Sitting-balance support: Bilateral upper extremity supported, Feet supported Sitting balance-Leahy Scale: Poor   Postural control: Posterior lean                                 ADL either performed or assessed with clinical judgement   ADL Overall ADL's : Needs assistance/impaired                                       General ADL Comments: MAX A don B socks in sitting, SETUP bed level self-feeding.      Pertinent Vitals/Pain Pain Assessment Pain Assessment: Faces Faces Pain Scale: Hurts little more Pain Location: back Pain Descriptors / Indicators: Discomfort, Grimacing Pain Intervention(s): Limited activity within patient's tolerance     Extremity/Trunk Assessment Upper Extremity Assessment Upper Extremity Assessment: Generalized weakness (limited by pain)   Lower Extremity Assessment Lower Extremity Assessment: Generalized weakness (extremely edematous)       Communication Communication Communication: Difficulty communicating thoughts/reduced clarity of speech   Cognition Arousal: Alert Behavior During Therapy: [facility] for tasks assessed/performed Overall Cognitive  Status: Within Functional Limits for tasks assessed                                        Home Living Family/patient expects to be discharged to:: Private residence Living Arrangements: Alone Available Help at Discharge: Family;Available PRN/intermittently Type of Home: House Home Access: Ramped entrance     Home Layout: One level               Home Equipment: BSC/3in1;Hospital bed          Prior  Functioning/Environment Prior Level of Function : Needs assist             Mobility Comments: reports walking >1 month ago, recently required assist form son or sister for [facility] t/fs. Primarily bed level last ~1 month          OT Problem List: Decreased strength;Decreased range of motion;Decreased activity tolerance;Impaired balance (sitting and/or standing);Decreased safety awareness      OT Treatment/Interventions: Self-care/ADL training;Therapeutic exercise;Energy conservation;DME and/or AE instruction;Therapeutic activities;Patient/family education;Balance training    OT Goals(Current goals can be found in the care plan section) Acute Rehab OT Goals Patient Stated Goal: to walk OT Goal Formulation: With patient Time For Goal Achievement: 02/15/23 Potential to Achieve Goals: Fair ADL Goals Pt Will Perform Grooming: with set-up;with supervision;sitting Pt Will Perform Lower Body Dressing: with mod assist;bed level Pt Will Transfer to Toilet: with max assist;with +2 assist;bedside commode;stand pivot transfer  OT Frequency: Min 1X/week    Co-evaluation              AM-PAC OT "6 Clicks" Daily Activity     Outcome Measure Help from another person eating meals?: A Little Help from another person taking care of personal grooming?: A Little Help from another person toileting, which includes using toliet, bedpan, or urinal?: A Lot Help from another person bathing (including washing, rinsing, drying)?: A Lot Help from another person to put on and taking off regular upper body clothing?: A Little Help from another person to put on and taking off regular lower body clothing?: A Lot 6 Click Score: 15   End of Session Nurse Communication: Mobility status  Activity Tolerance: Patient tolerated treatment well Patient left: in bed;with call bell/phone within reach;with bed alarm set  OT Visit Diagnosis: Other abnormalities of gait and mobility (R26.89);Muscle weakness  (generalized) (M62.81)                Time: 5784-6962 OT Time Calculation (min): 21 min Charges:  OT General Charges $OT Visit: 1 Visit OT Evaluation $OT Eval Moderate Complexity: 1 Mod  Kathie Dike, M.S. OTR/L  02/01/23, 2:14 PM  ascom [phone]

## 2023-02-01 NOTE — Progress Notes (Addendum)
Initial Nutrition Assessment  DOCUMENTATION CODES:   Morbid obesity  INTERVENTION:   -Liberalize diet to 2 gram sodium for wider variety of meal selections -Boost Plus TID, each supplement provides 360 kcals and 14 grams protein -MVI with minerals daily -2500 mg calcium carbonate BID per MD order -Draw labs and monitor for signs of micronutrient deficiency due to long standing gastric band surgery: FE, folic acid, thiamine, copper, zinc, vitamin B-12, vitamin D, vitamin A, vitamin E, and vitamin K  NUTRITION DIAGNOSIS:   Increased nutrient needs related to chronic illness (CHF) as evidenced by estimated needs.  GOAL:   Patient will meet greater than or equal to 90% of their needs  MONITOR:   PO intake, Supplement acceptance  REASON FOR ASSESSMENT:   Consult Assessment of nutrition requirement/status  ASSESSMENT:   Pt with medical history significant for HFrEF, PE, perforated sigmoid diverticulitis s/p colectomy and end colostomy, COPD, giant cell arteritis on chronic prednisone, secondary adrenal insufficiency with chronic hypotension, COPD, recurrent UTIs admitted due to code sepsis at facility.  Pt admitted with sepsis.   1/19- s/p BSE- recommending dysphagia 3 diet with thin liquids  Reviewed I/O's: +1.4 L x 24 hours and +3.8 L since admission  Spoke with pt at bedside, who was pleasant and in good spirits today. Her main complaint is that she feel cold; extra blankets provided. Pt reports that she had a prolonged hospitalization at [facility] and was recently discharged to [facility] prior to admission. Per pt, she was eating well PTA. She has experienced a general decline in health over the past 3-4 months secondary to weakness and reports her son had moved in her with to help care for her. She shares that she has been mainly bedbound and has difficulty moving her arms secondary to pain and weakness. Pt suspects some of this may be related to her history of gastric  bypass; she shares she had her vitamins repleted "because I wasn't absorbing any vitamins which is what made me so weak". Pt unsure which vitamins were repleted, but states "I think it was all of them". Pt shares that she had gastric band surgery in the 1980's and band was never removed. Per [facility] notes, vitamin: 7.4 on 10/07/22; zinc: 29, copper: 71, vitamin E: 17.8, and vitamin B6: 3 (10/12/22). She is unsure if she is getting any further vitamin regimens. She is tolerating 2 gram sodium diet without difficulty, sates she ate about 75% of her breakfast. She also drinks Boost at home and tolerates this well (likes chocolate flavor).   Per pt, here UBW is around 291#. Reviewed wt hx; pt has experienced a 7.7% wt loss over the past 6 months, which is not significant for time frame. Pt with moderate edema, which may be masking true weight loss as well as fat and muscle depletions.   Discussed importance of good meal and supplement intake to promote healing. Pt amenable to supplements, as well as further investigation of micronutrient deficiencies.   Medications reviewed and include calcium carbonate, vitamin D3, vitamin B-12, colace, ferrous sulfate, folic acid, lasix, neurontin, potassium chloride, and thiamine.   Labs reviewed: CBGS: 128 (inpatient orders for glycemic control are none).  Corrected calcium: 10.2.   NUTRITION - FOCUSED PHYSICAL EXAM:  Flowsheet Row Most Recent Value  Orbital Region No depletion  Upper Arm Region No depletion  Thoracic and Lumbar Region No depletion  Buccal Region No depletion  Temple Region Moderate depletion  Clavicle Bone Region No depletion  Clavicle  and Acromion Bone Region No depletion  Scapular Bone Region No depletion  Dorsal Hand Mild depletion  Patellar Region No depletion  Anterior Thigh Region No depletion  Posterior Calf Region No depletion  Edema (RD Assessment) Moderate  Hair Reviewed  Eyes Reviewed  Mouth Reviewed  Skin Reviewed  Nails Reviewed        Diet Order:   Diet Order             Diet 2 gram sodium Fluid consistency: Thin  Diet effective now                   EDUCATION NEEDS:   Education needs have been addressed  Skin:  Skin Assessment: Reviewed RN Assessment  Last BM:  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  Estimated Nutritional Needs:   Kcal:  1700-1900  Protein:  90-105 grams  Fluid:  > 1.7 L    Levada Schilling, RD, LDN, CDCES Registered Dietitian III Certified Diabetes Care and Education Specialist If unable to reach this RD, please use "RD Inpatient" group chat on secure chat between hours of 8am-4 pm daily

## 2023-02-01 NOTE — Progress Notes (Signed)
Progress Note   Patient: Wendy Sanchez ZOX:[id number] DOB: [birth year] DOA: 01/29/2023     2 DOS: the patient was seen and examined on 02/01/2023   Brief Sanchez course: Wendy Sanchez is a 74 y.o. 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 coli UTI and RLL pneumonia, as well as otitis externa, discharged to SNF, who was sent from the facility via EMS for code sepsis.  With EMS, patient febrile to 100.2, tachycardic to 125 tachypneic to 35 and hypotensive to 90/50.  Patient complains of generalized weakness and having a sore throat for the past 3 days before presentation.  ED course and data review: Tmax in the ED 99.9, heart rate 125 respirations 30 O2 sat 99% on room air and BP 102/76.  Hospitalist service contacted for admission.       Assessment and Plan:   Sepsis (HCC) likely in the setting of incomplete treatment of UTI, pneumonia otitis externa High risk for severe infection due to chronic immunosuppression Sepsis criteria include fever, tachycardia and tachypnea, hypotension with leukocytosis.   Lactic acid 1.2 and procalcitonin 0.78 Urine analysis unremarkable and chest x-ray clear Antibiotics have been de-escalated to ceftriaxone    Hypotension Adrenal insufficiency, secondary to chronic steroids Chronic systemic steroid use Suspect chronic versus related to sepsis  Continue midodrine 10 mg 3 times daily Continue hydrocortisone 5 mg, 3 tabs every morning 1 tablet every afternoon     Acute pharyngitis No evidence of thrush on exam Strep test normal Viral respiratory testing normal    Otitis externa Recently diagnosed during admission to [facility] Has completed a course of ofloxacin 0.3%   Recurrent UTI Continue current antibiotics     Peripheral neuropathy Vitamin and mineral deficiencies (copper, zinc,Vitamin A, B6) Prior history of gastric bypass Continue nortriptyline and gabapentin Continue thiamine,  multivitamin, folic acid, ferrous sulfate, vitamin B12 vitamin D3 25, vitamin C   Chronic pain syndrome Chronic opioid use Continue Lidoderm patch, gabapentin 300 twice daily, oxycodone 10/325 every 4 as needed   Giant cell arteritis (HCC) Previously on prednisone Currently on hydrocortisone 5 mg 3 tablets in the a.m. 1 in the PM Followed by rheumatology at [facility]   HFrEF (heart failure with reduced ejection fraction) (HCC) LVEF 40-45% in Sept 2024 Clinically euvolemic GDMT limited by hypotension Continue Lasix 20 mg p.o. daily   History of pulmonary embolus 2020(PE) Chronic anticoagulation Continue Eliquis   Colostomy in place [facility]) Ostomy care   Morbid obesity with BMI of 45.0-49.9, adult (HCC) Complicating factor to overall prognosis and care   History of renal cell carcinoma No acute issues   Chronic obstructive pulmonary disease (COPD) (HCC) Not acutely exacerbated Continue home inhalers   Bilateral lower extremity edema secondary to hypoalbuminemia Patient with albumin level less than 1.5 Dietitian consulted   DVT prophylaxis: Eliquis   Consults: none   Advance Care Planning:   Code Status: Prior    Family Communication: none   Disposition Plan: Back to previous home environment     Subjective:  Patient seen and examined at bedside this morning Admits to improvement in her generalized body pain Complaining of itchy vaginal discharge concerning for candidiasis  Physical Exam: General: Morbidly obese woman lying in bed not on oxygen HENT:     Head: Normocephalic and atraumatic.  Cardiovascular:     Rate and Rhythm: Tachycardia  improved    Heart sounds: Normal heart sounds.  Pulmonary: Air entry decreased especially at the bases Abdominal:     Palpations: Abdomen is soft.     Tenderness: There is no abdominal tenderness.  Musculoskeletal:     Right lower leg: Edema present.     Left lower leg: Edema present.  Neurological:     Mental Status: Mental status is at baseline.    Data Reviewed: I have reviewed patient chest x-ray that did not show pneumonia     Latest Ref Rng & Units 02/01/2023    6:02 AM 01/31/2023    6:38 AM 01/30/2023    5:49 AM  BMP  Glucose 70 - 99 mg/dL 74  75  88   BUN 8 - 23 mg/dL 16  16  17    Creatinine 0.44 - 1.00 mg/dL 5.62  1.30  8.65   Sodium 135 - 145 mmol/L 135  135  135   Potassium 3.5 - 5.1 mmol/L 4.4  3.8  3.9   Chloride 98 - 111 mmol/L 108  108  107   CO2 22 - 32 mmol/L 21  21  22    Calcium 8.9 - 10.3 mg/dL 8.2  8.1  7.8     Vitals:   01/31/23 1917 02/01/23 0000 02/01/23 0400 02/01/23 0856  BP: (!) 109/59 105/70 104/81 101/65  Pulse: 99 (!) 101 (!) 104 100  Resp: 18 18 18 17   Temp: 99.7 F (37.6 C) 98.7 F (37.1 C) 98.1 F (36.7 C) 98.5 F (36.9 C)  TempSrc: Oral Oral Oral Oral  SpO2: 100% 100% 100% 100%  Weight:      Height:          Latest Ref Rng & Units 02/01/2023    6:02 AM 01/31/2023    6:38 AM 01/30/2023    5:49 AM  CBC  WBC 4.0 - 10.5 K/uL 8.3  10.4  17.2   Hemoglobin 12.0 - 15.0 g/dL 8.5  8.9  8.5   Hematocrit 36.0 - 46.0 % 25.8  27.2  25.6   Platelets 150 - 400 K/uL 619  606  617      Author: Loyce Dys, MD 02/01/2023 5:56 PM  For on call review [email or web address].

## 2023-02-02 DIAGNOSIS — A4151 Sepsis due to Escherichia coli [E. coli]: Secondary | ICD-10-CM

## 2023-02-02 LAB — CBC WITH DIFFERENTIAL/PLATELET
Abs Immature Granulocytes: 0.11 10*3/uL — ABNORMAL HIGH (ref 0.00–0.07)
Basophils Absolute: 0 10*3/uL (ref 0.0–0.1)
Basophils Relative: 0 %
Eosinophils Absolute: 0.6 10*3/uL — ABNORMAL HIGH (ref 0.0–0.5)
Eosinophils Relative: 5 %
HCT: 25.3 % — ABNORMAL LOW (ref 36.0–46.0)
Hemoglobin: 8.3 g/dL — ABNORMAL LOW (ref 12.0–15.0)
Immature Granulocytes: 1 %
Lymphocytes Relative: 11 %
Lymphs Abs: 1.2 10*3/uL (ref 0.7–4.0)
MCH: 27.9 pg (ref 26.0–34.0)
MCHC: 32.8 g/dL (ref 30.0–36.0)
MCV: 85.2 fL (ref 80.0–100.0)
Monocytes Absolute: 0.3 10*3/uL (ref 0.1–1.0)
Monocytes Relative: 2 %
Neutro Abs: 8.4 10*3/uL — ABNORMAL HIGH (ref 1.7–7.7)
Neutrophils Relative %: 81 %
Platelets: 576 10*3/uL — ABNORMAL HIGH (ref 150–400)
RBC: 2.97 MIL/uL — ABNORMAL LOW (ref 3.87–5.11)
RDW: 16 % — ABNORMAL HIGH (ref 11.5–15.5)
WBC: 10.5 10*3/uL (ref 4.0–10.5)
nRBC: 0 % (ref 0.0–0.2)

## 2023-02-02 LAB — BASIC METABOLIC PANEL
Anion gap: 6 (ref 5–15)
BUN: 15 mg/dL (ref 8–23)
CO2: 22 mmol/L (ref 22–32)
Calcium: 8.1 mg/dL — ABNORMAL LOW (ref 8.9–10.3)
Chloride: 106 mmol/L (ref 98–111)
Creatinine, Ser: 0.55 mg/dL (ref 0.44–1.00)
GFR, Estimated: >60 mL/min (ref >60)
Glucose, Bld: 95 mg/dL (ref 70–99)
Potassium: 4 mmol/L (ref 3.5–5.1)
Sodium: 134 mmol/L — ABNORMAL LOW (ref 135–145)

## 2023-02-02 LAB — MAGNESIUM: Magnesium: 2.2 mg/dL (ref 1.7–2.4)

## 2023-02-02 NOTE — Progress Notes (Signed)
Progress Note   Patient: [facility] WJX:[id number] DOB: February 13, 1950 DOA: 01/29/2023     3 DOS: the patient was seen and examined on 02/02/2023     Brief hospital course: [facility] is a 73 y.o. 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 coli UTI and RLL pneumonia, as well as otitis externa, discharged to SNF, who was sent from the facility via EMS for code sepsis.  With EMS, patient febrile to 100.2, tachycardic to 125 tachypneic to 35 and hypotensive to 90/50.  Patient complains of generalized weakness and having a sore throat for the past 3 days before presentation.  ED course and data review: Tmax in the ED 99.9, heart rate 125 respirations 30 O2 sat 99% on room air and BP 102/76.  Hospitalist service contacted for admission.       Assessment and Plan:   Sepsis (HCC) likely in the setting of incomplete treatment of UTI, pneumonia otitis externa High risk for severe infection due to chronic immunosuppression Sepsis criteria include fever, tachycardia and tachypnea, hypotension with leukocytosis.   Lactic acid 1.2 and procalcitonin 0.78 Urine analysis unremarkable and chest x-ray clear Antibiotics have been de-escalated to ceftriaxone, we will complete 7 days total     Hypotension Adrenal insufficiency, secondary to chronic steroids Chronic systemic steroid use Suspect chronic versus related to sepsis  Continue midodrine 10 mg 3 times daily Continue home dose of hydrocortisone 5 mg, 3 tabs every morning 1 tablet every afternoon     Acute pharyngitis No evidence of thrush on exam Strep  test normal Viral respiratory testing normal   Otitis externa Recently diagnosed during admission to [facility] Has completed a course of ofloxacin 0.3%   Recurrent UTI Continue current antibiotics     Peripheral neuropathy Vitamin and mineral deficiencies (copper, zinc,Vitamin A, B6) Prior history of gastric bypass Continue nortriptyline and gabapentin Continue thiamine,  multivitamin, folic acid, ferrous sulfate, vitamin B12 vitamin D3 25, vitamin C   Chronic pain syndrome Chronic opioid use Continue Lidoderm patch, gabapentin 300 twice daily, oxycodone 10/325 every 4 as needed   Giant cell arteritis (HCC) Previously on prednisone Currently on hydrocortisone 5 mg 3 tablets in the a.m. 1 in the PM Followed by rheumatology at [facility]   HFrEF (heart failure with reduced ejection fraction) (HCC) LVEF 40-45% in Sept 2024 Clinically euvolemic GDMT limited by hypotension Continue Lasix 20 mg p.o. daily   History of pulmonary embolus 2020(PE) Chronic anticoagulation Continue Eliquis   Colostomy in place [facility]) Ostomy care   Morbid obesity with BMI of 45.0-49.9, adult (HCC) Complicating factor to overall prognosis and care   History of renal cell carcinoma No acute issues   Chronic obstructive pulmonary disease (COPD) (HCC) Not acutely exacerbated Continue home inhalers   Bilateral lower extremity edema secondary to hypoalbuminemia Patient with albumin level less than 1.5 Dietitian consulted   DVT prophylaxis: Eliquis   Consults: none   Advance Care Planning:   Code Status: Prior    Family Communication: none   Disposition Plan: Back to previous home environment     Subjective:  Patient seen and examined at bedside this morning Patient complains of itching both  forearms as well as chest Denies worsening shortness of breath nausea vomiting abdominal pain   Physical Exam: General: Morbidly obese woman lying in bed not on oxygen HENT:     Head: Normocephalic and  atraumatic.  Cardiovascular:     Rate and Rhythm: Tachycardia improved    Heart sounds: Normal heart sounds.  Pulmonary: Air entry decreased especially at the bases Abdominal:     Palpations: Abdomen is soft.     Tenderness: There is no abdominal tenderness.  Musculoskeletal: Bilateral lower extremity pitting edema Neurological:     Mental Status: Mental status is at baseline.    Data Reviewed: I have reviewed patient chest x-ray that did not show pneumonia      Latest Ref Rng & Units 02/02/2023    5:19 AM 02/01/2023    6:02 AM 01/31/2023    6:38 AM  BMP  Glucose 70 - 99 mg/dL 95  74  75   BUN 8 - 23 mg/dL 15  16  16    Creatinine 0.44 - 1.00 mg/dL 4.69  6.29  5.28   Sodium 135 - 145 mmol/L 134  135  135   Potassium 3.5 - 5.1 mmol/L 4.0  4.4  3.8   Chloride 98 - 111 mmol/L 106  108  108   CO2 22 - 32 mmol/L 22  21  21    Calcium 8.9 - 10.3 mg/dL 8.1  8.2  8.1      Vitals:   02/02/23 0445 02/02/23 0600 02/02/23 0818 02/02/23 1523  BP: 111/69  106/65 109/71  Pulse: 91  91 96  Resp: 19  18 16   Temp: 97.7 F (36.5 C)  98 F (36.7 C) 98.8 F (37.1 C)  TempSrc: Oral     SpO2: 100%  100% 100%  Weight:  128.4 kg    Height:          Latest Ref Rng & Units 02/02/2023    5:19 AM 02/01/2023    6:02 AM 01/31/2023    6:38 AM  CBC  WBC 4.0 - 10.5 K/uL 10.5  8.3  10.4   Hemoglobin 12.0 - 15.0 g/dL 8.3  8.5  8.9   Hematocrit 36.0 - 46.0 % 25.3  25.8  27.2   Platelets 150 - 400 K/uL 576  619  606     Disposition: Patient awaiting skilled nursing facility placement  Author: Loyce Dys, MD 02/02/2023 5:32 PM  For on call review [email or web address].

## 2023-02-02 NOTE — Plan of Care (Signed)
?  Problem: Respiratory: ?Goal: Ability to maintain adequate ventilation will improve ?Outcome: Progressing ?  ? ?Problem: Clinical Measurements: ?Goal: Ability to maintain clinical measurements within normal limits will improve ?Outcome: Progressing ?  ?Problem: Clinical Measurements: ?Goal: Diagnostic test results will improve ?Outcome: Progressing ?  ?Problem: Clinical Measurements: ?Goal: Respiratory complications will improve ?Outcome: Progressing ?  ?Problem: Coping: ?Goal: Level of anxiety will decrease ?Outcome: Progressing ?  ? ?  ?

## 2023-02-02 NOTE — Care Management Important Message (Signed)
Important Message  Patient Details  Name: [facility] Sanchez MRN: [id number] Date of Birth: 04/27/1950   Important Message Given:  Yes - Medicare IM     Sherilyn Banker 02/02/2023, 12:27 PM

## 2023-02-02 NOTE — Evaluation (Signed)
Physical Therapy Evaluation Patient Details Name: [facility] MRN: [id number] DOB: 05-10-1950 Today's Date: 02/02/2023  History of Present Illness  Wend[facility] Sanchez is a 72yoF who comes to [facility] on 01/29/23 from [facility] where she was DC after a lengthy admission to [facility]- pt with fever, tachycardia, hypotension. Pt has been on ABX for UTI previous 3 days. PMH: HFrEF, PE on eliquis, s/p colectomy and colostomy, COPD, chronic prednisone use, adrenal insufficiency, chronic hypotension on midodrine.  Clinical Impression  Pt in bed on entry, breakfast finished. Pt is remarkably weak, requires mod-maxA of legs for ROM exercises, unable to put forth much force for resistance. Pt assisted with exercises for legs, core and neck in session, has improved pain and reduced stiffness at EOS, left sitting up in bed at end of session, trunk at 55 degrees, educated on scapular and neck ROM exercises to perform independently. Pt remains +2 for bed mobility and lift appropriate for transfers at this time. 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 Will continue to follow and advance program as appropriate.       If plan is discharge home, recommend the following: Two people to help with walking and/or transfers;Two people to help with bathing/dressing/bathroom;Assistance with cooking/housework;Assist for transportation;Help with stairs or ramp for entrance   Can travel by private vehicle   No    Equipment Recommendations None recommended by PT  Recommendations for Other Services       Functional Status Assessment Patient has had a recent decline in their functional status and demonstrates the ability to make significant improvements in function in a reasonable and predictable amount of time.     Precautions / Restrictions Precautions Precautions: Fall Restrictions Weight Bearing Restrictions Per Provider Order: No      Mobility  Bed Mobility Overal  bed mobility: Needs Assistance Bed Mobility: Supine to Sit     Supine to sit: Total assist, HOB elevated, Used rails (requires bed features due to golbal weakness)          Transfers Overall transfer level: Needs assistance (too weak to safely attempt at this timer)                 General transfer comment: recommend use of hoyer lift with NSG    Ambulation/Gait                  Stairs            Wheelchair Mobility     Tilt Bed    Modified Rankin (Stroke Patients Only)       Balance                                             Pertinent Vitals/Pain Pain Assessment Pain Assessment: 0-10 Pain Score: 8  Pain Location: buttocks, BLE joints, HA, Right shoulder Pain Intervention(s): Limited activity within patient's tolerance, Monitored during session, Premedicated before session    Home Living Family/patient expects to be discharged to:: Private residence Living Arrangements: Children (son) Available Help at Discharge: Family;Available PRN/intermittently Type of Home: House Home Access: Ramped entrance       Home Layout: One level Home Equipment: BSC/3in1;Hospital bed      Prior Function               Mobility Comments: Primarily bed level last ~1  month ADLs Comments: needs assistance; painful Rt shoulder s/p rotator cuff injury     Extremity/Trunk Assessment                Communication      Cognition Arousal: Alert Behavior During Therapy: WFL for tasks assessed/performed Overall Cognitive Status: Within Functional Limits for tasks assessed                                          General Comments      Exercises Low Level/ICU Exercises Ankle Circles/Pumps: Both, Supine, AAROM Short Arc Quad: Both, AAROM, Supine, 15 reps Hip Extension: Both, 10 reps, Supine, Strengthening Hip ABduction/ADduction: AAROM, Supine, 10 reps Heel Slides: AAROM, 10 reps, Supine Other  Exercises Other Exercises: cervical retraction isometrics x10, cervical rotation 1x5 bilat ( was performing prior to entry as 2well) Other Exercises: isometric shoulder extension into pillows 1x10 bilat   Assessment/Plan    PT Assessment Patient needs continued PT services  PT Problem List Decreased strength;Decreased range of motion;Decreased activity tolerance;Decreased mobility;Decreased knowledge of precautions;Decreased safety awareness;Decreased knowledge of use of DME;Pain       PT Treatment Interventions DME instruction;Patient/family education;Functional mobility training;Therapeutic activities;Therapeutic exercise;Balance training;Wheelchair mobility training    PT Goals (Current goals can be found in the Care Plan section)  Acute Rehab PT Goals Patient Stated Goal: regain strength, improve pain PT Goal Formulation: With patient Time For Goal Achievement: 02/16/23 Potential to Achieve Goals: Fair    Frequency Min 1X/week     Co-evaluation               AM-PAC PT "6 Clicks" Mobility  Outcome Measure Help needed turning from your back to your side while in a flat bed without using bedrails?: Total Help needed moving from lying on your back to sitting on the side of a flat bed without using bedrails?: Total Help needed moving to and from a bed to a chair (including a wheelchair)?: Total Help needed standing up from a chair using your arms (e.g., wheelchair or bedside chair)?: Total Help needed to walk in hospital room?: Total Help needed climbing 3-5 steps with a railing? : Total 6 Click Score: 6    End of Session   Activity Tolerance: No increased pain;Patient limited by fatigue;Patient tolerated treatment well Patient left: in bed;with call bell/phone within reach   PT Visit Diagnosis: Difficulty in walking, not elsewhere classified (R26.2);Other abnormalities of gait and mobility (R26.89);Muscle weakness (generalized) (M62.81)    Time: 1610-9604 PT Time  Calculation (min) (ACUTE ONLY): 27 min   Charges:   PT Evaluation $PT Eval Moderate Complexity: 1 Mod PT Treatments $Therapeutic Exercise: 8-22 mins PT General Charges $$ ACUTE PT VISIT: 1 Visit        12:38 PM, 02/02/23 Rosamaria Lints, PT, DPT Physical Therapist - [facility]  [phone] (ASCOM)    Roman Sandall C 02/02/2023, 12:35 PM

## 2023-02-03 DIAGNOSIS — E43 Unspecified severe protein-calorie malnutrition: Secondary | ICD-10-CM

## 2023-02-03 DIAGNOSIS — I9589 Other hypotension: Secondary | ICD-10-CM | POA: Diagnosis not present

## 2023-02-03 DIAGNOSIS — Z85528 Personal history of other malignant neoplasm of kidney: Secondary | ICD-10-CM

## 2023-02-03 DIAGNOSIS — N390 Urinary tract infection, site not specified: Secondary | ICD-10-CM | POA: Diagnosis not present

## 2023-02-03 DIAGNOSIS — Z7952 Long term (current) use of systemic steroids: Secondary | ICD-10-CM

## 2023-02-03 DIAGNOSIS — A419 Sepsis, unspecified organism: Secondary | ICD-10-CM | POA: Diagnosis not present

## 2023-02-03 DIAGNOSIS — J449 Chronic obstructive pulmonary disease, unspecified: Secondary | ICD-10-CM

## 2023-02-03 DIAGNOSIS — Z86711 Personal history of pulmonary embolism: Secondary | ICD-10-CM | POA: Diagnosis not present

## 2023-02-03 DIAGNOSIS — Z6841 Body Mass Index (BMI) 40.0 and over, adult: Secondary | ICD-10-CM

## 2023-02-03 DIAGNOSIS — E2749 Other adrenocortical insufficiency: Secondary | ICD-10-CM

## 2023-02-03 LAB — CULTURE, BLOOD (ROUTINE X 2)
Culture: NO GROWTH
Culture: NO GROWTH

## 2023-02-03 LAB — VITAMIN E
Vitamin E (Alpha Tocopherol): 12.5 mg/L (ref 9.0–29.0)
Vitamin E(Gamma Tocopherol): 1.5 mg/L (ref 0.5–4.9)

## 2023-02-03 LAB — CBC WITH DIFFERENTIAL/PLATELET
Abs Immature Granulocytes: 0.15 K/uL — ABNORMAL HIGH (ref 0.00–0.07)
Basophils Absolute: 0 K/uL (ref 0.0–0.1)
Basophils Relative: 0 %
Eosinophils Absolute: 0.6 K/uL — ABNORMAL HIGH (ref 0.0–0.5)
Eosinophils Relative: 4 %
HCT: 27.4 % — ABNORMAL LOW (ref 36.0–46.0)
Hemoglobin: 8.8 g/dL — ABNORMAL LOW (ref 12.0–15.0)
Immature Granulocytes: 1 %
Lymphocytes Relative: 9 %
Lymphs Abs: 1.3 K/uL (ref 0.7–4.0)
MCH: 27.6 pg (ref 26.0–34.0)
MCHC: 32.1 g/dL (ref 30.0–36.0)
MCV: 85.9 fL (ref 80.0–100.0)
Monocytes Absolute: 0.3 K/uL (ref 0.1–1.0)
Monocytes Relative: 2 %
Neutro Abs: 12.4 K/uL — ABNORMAL HIGH (ref 1.7–7.7)
Neutrophils Relative %: 84 %
Platelets: 681 K/uL — ABNORMAL HIGH (ref 150–400)
RBC: 3.19 MIL/uL — ABNORMAL LOW (ref 3.87–5.11)
RDW: 15.9 % — ABNORMAL HIGH (ref 11.5–15.5)
WBC: 14.7 K/uL — ABNORMAL HIGH (ref 4.0–10.5)
nRBC: 0 % (ref 0.0–0.2)

## 2023-02-03 LAB — BASIC METABOLIC PANEL
Anion gap: 7 (ref 5–15)
BUN: 13 mg/dL (ref 8–23)
CO2: 23 mmol/L (ref 22–32)
Calcium: 8.6 mg/dL — ABNORMAL LOW (ref 8.9–10.3)
Chloride: 104 mmol/L (ref 98–111)
Creatinine, Ser: 0.57 mg/dL (ref 0.44–1.00)
GFR, Estimated: >60 mL/min (ref >60)
Glucose, Bld: 97 mg/dL (ref 70–99)
Potassium: 4.1 mmol/L (ref 3.5–5.1)
Sodium: 134 mmol/L — ABNORMAL LOW (ref 135–145)

## 2023-02-03 LAB — COPPER, SERUM: Copper: 63 ug/dL — ABNORMAL LOW (ref 80–158)

## 2023-02-03 LAB — VITAMIN A: Vitamin A (Retinoic Acid): 9.4 ug/dL — ABNORMAL LOW (ref 22.0–69.5)

## 2023-02-03 LAB — ZINC: Zinc: 29 ug/dL — ABNORMAL LOW (ref 44–115)

## 2023-02-03 MED ORDER — TORSEMIDE 20 MG PO TABS
20.0000 mg | ORAL_TABLET | Freq: Every day | ORAL | Status: DC
  Administered 2023-02-04: 20 mg via ORAL
  Filled 2023-02-03: qty 1

## 2023-02-03 MED ORDER — FUROSEMIDE 10 MG/ML IJ SOLN
40.0000 mg | Freq: Once | INTRAMUSCULAR | Status: AC
  Administered 2023-02-03: 40 mg via INTRAVENOUS
  Filled 2023-02-03: qty 4

## 2023-02-03 MED ORDER — METOPROLOL TARTRATE 25 MG PO TABS
12.5000 mg | ORAL_TABLET | Freq: Two times a day (BID) | ORAL | Status: DC
  Administered 2023-02-03 – 2023-02-04 (×2): 12.5 mg via ORAL
  Filled 2023-02-03 (×3): qty 1

## 2023-02-03 MED ORDER — MAGIC MOUTHWASH W/LIDOCAINE
15.0000 mL | Freq: Three times a day (TID) | ORAL | Status: DC | PRN
  Administered 2023-02-03: 15 mL via ORAL
  Filled 2023-02-03 (×2): qty 15

## 2023-02-03 NOTE — Progress Notes (Addendum)
Occupational Therapy Treatment Patient Details Name: [facility] MRN: [id number] DOB: 28-Jun-1950 Today's Date: 02/03/2023   History of present illness [facility] is a 72yoF who comes to [facility] on 01/29/23 from [facility] where she was DC after a lengthy admission to [facility]- pt with fever, tachycardia, hypotension. Pt has been on ABX for UTI previous 3 days. PMH: HFrEF, PE on eliquis, s/p colectomy and colostomy, COPD, chronic prednisone use, adrenal insufficiency, chronic hypotension on midodrine.   OT comments  Pt. reports 6-7/10 pain in her bilateral shoulders with the right shoulder being greater than the left, bilateral hands/digits. Pt. tolerated gentle ROM to UEs, and reviewed exercises for her bilateral digits. Pt. education was provided about positioning for BUEs. Pt. was assisted with placement of pillows for support under the right UE, and behind her head, as well as at the upper back. Pt. requires minA with set-up for light grooming tasks. Reviewed joint protection principles/strategies for bilateral hands, and shoulders during ADLs. Pt. continues to benefit from OT services for ADL training, A/E training, there. Ex., and pt. education about joint protection, home modification, and DME. OT discharge recommendation remains appropriate.        If plan is discharge home, recommend the following:  Two people to help with walking and/or transfers;Two people to help with bathing/dressing/bathroom   Equipment Recommendations  Hospital bed;Hoyer lift    Recommendations for Other Services      Precautions / Restrictions Precautions Precautions: Fall Restrictions Weight Bearing Restrictions Per Provider Order: No       Mobility Bed Mobility    Total A repositioning in bed                Transfers                         Balance                                           ADL either performed or assessed with clinical judgement   ADL  Overall ADL's : Needs assistance/impaired     Grooming: Set up;Minimal assistance               Lower Body Dressing: Maximal assistance    Toileting : Total A                  Extremity/Trunk Assessment Upper Extremity Assessment Upper Extremity Assessment: Generalized weakness            Vision       Perception     Praxis      Cognition Arousal: Alert Behavior During Therapy: WFL for tasks assessed/performed Overall Cognitive Status: Within Functional Limits for tasks assessed                                          Exercises      Shoulder Instructions       General Comments      Pertinent Vitals/ Pain       Pain Assessment Pain Assessment: 0-10 Pain Score: 7  Pain Location: buttocks, BLE joints, HA, Right shoulder, aand bilateral finger joints Pain Descriptors / Indicators: Discomfort, Grimacing Pain Intervention(s): Limited activity within patient's tolerance, Monitored during session, Premedicated before session  Home  Living                                          Prior Functioning/Environment              Frequency  Min 1X/week        Progress Toward Goals  OT Goals(current goals can now be found in the care plan section)  Progress towards OT goals: Progressing toward goals  Acute Rehab OT Goals OT Goal Formulation: With patient Time For Goal Achievement: 02/15/23 Potential to Achieve Goals: Fair  Plan      Co-evaluation                 AM-PAC OT "6 Clicks" Daily Activity     Outcome Measure   Help from another person eating meals?: A Little Help from another person taking care of personal grooming?: A Little Help from another person toileting, which includes using toliet, bedpan, or urinal?: A Lot Help from another person bathing (including washing, rinsing, drying)?: A Lot Help from another person to put on and taking off regular upper body clothing?: A Little Help  from another person to put on and taking off regular lower body clothing?: A Lot 6 Click Score: 15    End of Session    OT Visit Diagnosis: Other abnormalities of gait and mobility (R26.89);Muscle weakness (generalized) (M62.81)   Activity Tolerance Patient limited by pain   Patient Left in bed;with call bell/phone within reach   Nurse Communication          Time: 1540-1605 OT Time Calculation (min): 25 min  Charges: OT General Charges $OT Visit: 1 Visit OT Treatments $Self Care/Home Management : 23-37 mins  Olegario Messier, MS, OTR/L  Olegario Messier 02/03/2023, 4:21 PM

## 2023-02-03 NOTE — TOC Progression Note (Signed)
Transition of Care [facility]) - Progression Note    Patient Details  Name: Wendy Sanchez MRN: [id number] Date of Birth: January 02, 1951  Transition of Care [facility]) CM/SW Contact  Erin Sons, [place] Phone Number: 02/03/2023, 3:20 PM  Clinical Narrative:     Pt from [facility] for STR. Pt can return to SNF tomorrow pending SNF auth approval. TOC CMA is initiating SNF auth request. Berkley Harvey [id number]   Berkley Harvey is pending                                       Social Determinants of Health (SDOH) Interventions SDOH Screenings   Food Insecurity: No Food Insecurity (01/31/2023)  Housing: Low Risk  (01/31/2023)  Transportation Needs: No Transportation Needs (01/31/2023)  Recent Concern: Transportation Needs - Unmet Transportation Needs (01/24/2023)   Received from [facility]  Utilities: Not At Risk (01/31/2023)  Depression (PHQ2-9): Low Risk  (07/01/2022)  Financial Resource Strain: Low Risk  (12/26/2020)   Received from [facility], [facility]  Social Connections: Unknown (01/31/2023)  Tobacco Use: High Risk (01/29/2023)    Readmission Risk Interventions    02/03/2021    9:12 AM  Readmission Risk Prevention Plan  Transportation Screening Complete  Medication Review (RN Care Manager) Complete  PCP or Specialist appointment within 3-5 days of discharge Complete  HRI or Home Care Consult Complete  Palliative Care Screening Not Applicable  Skilled Nursing Facility Complete

## 2023-02-03 NOTE — Progress Notes (Signed)
Progress Note   Patient: Wendy Sanchez AVW:[id number] DOB: 01-03-1951 DOA: 01/29/2023     4 DOS: the patient was seen and examined on 02/03/2023     Brief hospital course: Wendy Sanchez is a 73 y.o. 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 coli UTI and RLL pneumonia, as well as otitis externa, discharged to SNF, who was sent from the facility via EMS for code sepsis.  With EMS, patient febrile to 100.2, tachycardic to 125 tachypneic to 35 and hypotensive to 90/50.  Patient complains of generalized weakness and having a sore throat for the past 3 days before presentation.  ED course and data review: Tmax in the ED 99.9, heart rate 125 respirations 30 O2 sat 99% on room air and BP 102/76.  Hospitalist service contacted for admission.   1/22: Vital stable, afebrile, worsening leukocytosis, patient is on hydrocortisone.  Urine and blood cultures negative, mild tachycardia-adding low-dose metoprolol.   Assessment and Plan:   Sepsis (HCC) likely in the setting of incomplete treatment of UTI, pneumonia otitis externa High risk for severe infection due to chronic immunosuppression Sepsis criteria include fever, tachycardia and tachypnea, hypotension with leukocytosis.   Lactic acid 1.2 and procalcitonin 0.78 Urine analysis unremarkable and chest x-ray clear Antibiotics have been de-escalated to ceftriaxone, we will complete 7 days total Current blood and urine cultures remain negative    Hypotension Adrenal insufficiency, secondary to chronic steroids Chronic systemic steroid use Suspect chronic versus  related to sepsis  Continue midodrine 10 mg 3 times daily Continue home dose of hydrocortisone 5 mg, 3 tabs every morning 1 tablet every afternoon    Acute pharyngitis No evidence of thrush on exam Strep test normal Viral respiratory testing normal   Otitis externa Recently diagnosed during admission to [facility] Has completed a course of ofloxacin 0.3%   Recurrent UTI Continue current antibiotics   Peripheral neuropathy Vitamin and mineral deficiencies (copper, zinc,Vitamin A, B6) Prior history of gastric bypass Continue nortriptyline and gabapentin Continue thiamine,  multivitamin, folic acid, ferrous sulfate, vitamin B12 vitamin D3 25, vitamin C   Chronic pain syndrome Chronic opioid use Continue Lidoderm patch, gabapentin 300 twice daily, oxycodone 10/325 every 4 as needed   Giant cell arteritis (HCC) Previously on prednisone Currently on hydrocortisone 5 mg 3 tablets in the a.m. 1 in the PM Followed by rheumatology at [facility]   HFrEF (heart failure with reduced ejection fraction) (HCC) LVEF 40-45% in Sept 2024 Significant lower extremity edema GDMT limited by hypotension Check BNP Giving 1 dose of IV Lasix P.o. Lasix is being switched with torsemide.   History of pulmonary embolus 2020(PE) Chronic anticoagulation Continue Eliquis   Colostomy in place [facility]) Ostomy care   Morbid obesity with BMI of 45.0-49.9, adult (HCC) Complicating factor to overall prognosis and care   History of renal cell carcinoma No acute issues   Chronic obstructive pulmonary disease (COPD) (HCC) Not acutely exacerbated Continue home inhalers   Bilateral lower extremity edema secondary to hypoalbuminemia Patient with albumin level less than 1.5 Dietitian consulted   DVT prophylaxis: Eliquis   Consults: none   Advance Care Planning:   Code Status: Prior  Family Communication: none   Disposition Plan: Back to previous home environment     Subjective:  Patient was complaining of  some backache, she wants to go back to her prior SNF.   Physical Exam: General.  Morbidly obese elderly lady, in no acute distress. Pulmonary.  Lungs clear bilaterally, normal respiratory effort. CV.  Regular rate and rhythm, no JVD, rub or murmur. Abdomen.  Soft, nontender, nondistended, BS positive. CNS.  Alert and oriented .  No focal neurologic deficit. Extremities.  2+ LE edema, pulses intact and symmetrical.  Data Reviewed: Prior data reviewed      Latest Ref Rng & Units 02/03/2023    5:29 AM 02/02/2023    5:19 AM 02/01/2023    6:02 AM  BMP  Glucose 70 - 99 mg/dL 97  95  74   BUN 8 - 23 mg/dL 13  15  16    Creatinine 0.44 - 1.00 mg/dL 4.09  8.11  9.14   Sodium 135 - 145 mmol/L 134  134  135   Potassium 3.5 - 5.1 mmol/L 4.1  4.0  4.4   Chloride 98 - 111 mmol/L 104  106  108   CO2 22 - 32 mmol/L 23  22  21    Calcium 8.9 - 10.3 mg/dL 8.6  8.1  8.2      Vitals:   02/03/23 0015 02/03/23 0423 02/03/23 0840 02/03/23 1117  BP: 107/63 100/82 108/70 101/63  Pulse: (!) 101 (!) 105  (!) 105  Resp: 20 (!) 22    Temp: 98.5 F (36.9 C) 97.6 F (36.4 C)  98.8 F (37.1 C)  TempSrc: Oral     SpO2: 100% 100%  100%  Weight:      Height:          Latest Ref Rng & Units 02/03/2023    5:29 AM 02/02/2023    5:19 AM 02/01/2023    6:02 AM  CBC  WBC 4.0 - 10.5 K/uL 14.7  10.5  8.3   Hemoglobin 12.0 - 15.0 g/dL 8.8  8.3  8.5   Hematocrit 36.0 - 46.0 % 27.4  25.3  25.8   Platelets 150 - 400 K/uL [phone]  [phone]  [phone]     Disposition: Patient awaiting skilled nursing facility placement  Author: Arnetha Courser, MD 02/03/2023 2:55 PM  For on call review [email or web address].

## 2023-02-04 DIAGNOSIS — G894 Chronic pain syndrome: Secondary | ICD-10-CM

## 2023-02-04 DIAGNOSIS — A419 Sepsis, unspecified organism: Secondary | ICD-10-CM | POA: Diagnosis not present

## 2023-02-04 DIAGNOSIS — J029 Acute pharyngitis, unspecified: Secondary | ICD-10-CM | POA: Diagnosis not present

## 2023-02-04 DIAGNOSIS — N390 Urinary tract infection, site not specified: Secondary | ICD-10-CM | POA: Diagnosis not present

## 2023-02-04 DIAGNOSIS — I9589 Other hypotension: Secondary | ICD-10-CM | POA: Diagnosis not present

## 2023-02-04 DIAGNOSIS — F1190 Opioid use, unspecified, uncomplicated: Secondary | ICD-10-CM

## 2023-02-04 DIAGNOSIS — M316 Other giant cell arteritis: Secondary | ICD-10-CM

## 2023-02-04 DIAGNOSIS — I5020 Unspecified systolic (congestive) heart failure: Secondary | ICD-10-CM

## 2023-02-04 LAB — BASIC METABOLIC PANEL
Anion gap: 8 (ref 5–15)
BUN: 15 mg/dL (ref 8–23)
CO2: 23 mmol/L (ref 22–32)
Calcium: 8.5 mg/dL — ABNORMAL LOW (ref 8.9–10.3)
Chloride: 102 mmol/L (ref 98–111)
Creatinine, Ser: 0.52 mg/dL (ref 0.44–1.00)
GFR, Estimated: >60 mL/min (ref >60)
Glucose, Bld: 84 mg/dL (ref 70–99)
Potassium: 4 mmol/L (ref 3.5–5.1)
Sodium: 133 mmol/L — ABNORMAL LOW (ref 135–145)

## 2023-02-04 LAB — CBC
HCT: 27.3 % — ABNORMAL LOW (ref 36.0–46.0)
Hemoglobin: 9.2 g/dL — ABNORMAL LOW (ref 12.0–15.0)
MCH: 28 pg (ref 26.0–34.0)
MCHC: 33.7 g/dL (ref 30.0–36.0)
MCV: 83.2 fL (ref 80.0–100.0)
Platelets: 637 10*3/uL — ABNORMAL HIGH (ref 150–400)
RBC: 3.28 MIL/uL — ABNORMAL LOW (ref 3.87–5.11)
RDW: 16.2 % — ABNORMAL HIGH (ref 11.5–15.5)
WBC: 12.3 10*3/uL — ABNORMAL HIGH (ref 4.0–10.5)
nRBC: 0 % (ref 0.0–0.2)

## 2023-02-04 LAB — BRAIN NATRIURETIC PEPTIDE: B Natriuretic Peptide: 315.9 pg/mL — ABNORMAL HIGH (ref 0.0–100.0)

## 2023-02-04 MED ORDER — MAGIC MOUTHWASH W/LIDOCAINE: 15.0000 mL | Freq: Three times a day (TID) | ORAL | Status: DC | PRN

## 2023-02-04 MED ORDER — ZINC SULFATE 220 (50 ZN) MG PO CAPS: 220.0000 mg | ORAL_CAPSULE | Freq: Every day | ORAL | Status: DC

## 2023-02-04 MED ORDER — VITAMIN A 3 MG (10000 UNIT) PO CAPS
10000.0000 [IU] | ORAL_CAPSULE | Freq: Every day | ORAL | Status: DC
  Administered 2023-02-04: 10000 [IU] via ORAL
  Filled 2023-02-04: qty 1

## 2023-02-04 MED ORDER — METOPROLOL TARTRATE 25 MG PO TABS: 25.0000 mg | ORAL_TABLET | Freq: Two times a day (BID) | ORAL | Status: DC

## 2023-02-04 MED ORDER — COPPER 2 MG PO TABS: 2.0000 mg | Freq: Every day | Status: DC

## 2023-02-04 MED ORDER — VITAMIN A 3 MG (10000 UNIT) PO CAPS: 10000.0000 [IU] | ORAL_CAPSULE | Freq: Every day | ORAL | Status: DC

## 2023-02-04 MED ORDER — COPPER 2 MG PO TABS
2.0000 mg | Freq: Every day | Status: DC
  Administered 2023-02-04: 2 mg via ORAL
  Filled 2023-02-04: qty 1

## 2023-02-04 MED ORDER — TORSEMIDE 20 MG PO TABS: 20.0000 mg | ORAL_TABLET | Freq: Every day | ORAL | Status: DC

## 2023-02-04 MED ORDER — ZINC SULFATE 220 (50 ZN) MG PO CAPS
220.0000 mg | ORAL_CAPSULE | Freq: Every day | ORAL | Status: DC
  Administered 2023-02-04: 220 mg via ORAL
  Filled 2023-02-04: qty 1

## 2023-02-04 MED ORDER — BOOST PLUS PO LIQD: 237.0000 mL | Freq: Three times a day (TID) | ORAL | Status: DC

## 2023-02-04 NOTE — TOC Transition Note (Signed)
Transition of Care [facility]) - Discharge Note   Patient Details  Name: Wendy Sanchez MRN: [id number] Date of Birth: [birth year]  Transition of [facility]) CM/SW Contact:  Truddie Hidden, RN Phone Number: 02/04/2023, 12:46 PM   Clinical Narrative:    Per Nav[employer] portal patient is approved 1/23-1/27. MD notified.            Patient Goals and CMS Choice            Discharge Placement                       Discharge Plan and Services Additional resources added to the After Visit Summary for                                       Social Drivers of Health (SDOH) Interventions SDOH Screenings   Food Insecurity: No Food Insecurity (01/31/2023)  Housing: Low Risk  (01/31/2023)  Transportation Needs: No Transportation Needs (01/31/2023)  Recent Concern: Transportation Needs - Unmet Transportation Needs (01/24/2023)   Received from [facility]  Utilities: Not At Risk (01/31/2023)  Depression (PHQ2-9): Low Risk  (07/01/2022)  Financial Resource Strain: Low Risk  (12/26/2020)   Received from [facility]  Social Connections: Unknown (01/31/2023)  Tobacco Use: High Risk (01/29/2023)     Readmission Risk Interventions    02/03/2021    9:12 AM  Readmission Risk Prevention Plan  Transportation Screening Complete  Medication Review (RN Care Manager) Complete  PCP or Specialist appointment within 3-5 days of discharge Complete  HRI or Home Care Consult Complete  Palliative Care Screening Not Applicable  Skilled Nursing Facility Complete

## 2023-02-04 NOTE — [place] FL2 (Signed)
[place] MEDICAID FL2 LEVEL OF CARE FORM     IDENTIFICATION  Patient Name: Wendy Sanchez Birthdate: 1950/03/07 Sex: female Admission Date (Current Location): 01/29/2023  [facility] and [place] Number:  Chiropodist and Address:  [facility], [address] [place], [place] [address]      Provider Number: [id number]  Attending Physician Name and Address:  Arnetha Courser, MD  Relative Name and Phone Number:  Sherlynn Carbon (Sister)  [phone] [facility])    Current Level of Care: Hospital Recommended Level of Care: Skilled Nursing Facility Prior Approval Number:    Date Approved/Denied:   PASRR Number: [id number]  Discharge Plan: SNF    Current Diagnoses: Patient Active Problem List   Diagnosis Date Noted   Sepsis (HCC) 01/30/2023   Chronic obstructive pulmonary disease (COPD) (HCC) 01/30/2023   Recurrent UTI 01/30/2023   Otitis externa 01/30/2023   Hypotension 01/30/2023   Peripheral neuropathy 01/30/2023   Acute pharyngitis 01/30/2023   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   Severe protein-calorie malnutrition (HCC) 10/10/2022   History of pulmonary embolus 2020(PE) 04/07/2021   Acute diverticulitis 01/30/2021   Current chronic use of systemic steroids 01/30/2021   Chronic diastolic CHF (congestive heart failure) (HCC) 01/30/2021   Compression fracture of T2 vertebra (HCC) 01/30/2021   S/P PICC central line placement 01/30/2021   Sinus tachycardia 11/12/2020   COVID-19 virus infection 11/11/2020   Colostomy in place [facility]) 11/09/2020   AKI (acute kidney injury) (HCC) 11/09/2020   Severe sepsis (HCC) 11/07/2020   Pain in joint, shoulder region 07/03/2020   Giant cell arteritis (HCC) 05/01/2020   History of renal cell carcinoma 04/25/2020   HFrEF (heart failure with reduced ejection fraction) (HCC) 03/22/2020   Frontal headache 05/30/2019   Carcinoma, renal cell, left (HCC) 11/14/2018   Pulmonary embolism (HCC)  07/13/2018   DDD (degenerative disc disease), lumbar 06/30/2018   Chronic low back pain 06/30/2018   Hip pain, bilateral 06/30/2018   Chronic, continuous use of opioids 06/30/2018   PVC (premature ventricular contraction) 06/04/2017   Chest pain 10/19/2016   Presence of right artificial knee joint 05/18/2016   Pes anserinus bursitis of left knee 05/18/2016   Unilateral primary osteoarthritis, right knee 12/13/2015   Hx of total knee arthroplasty 12/13/2015   Osteoarthritis of left knee 08/02/2015   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   Morbid obesity with BMI of 45.0-49.9, adult (HCC) 07/25/2015   Spinal stenosis of lumbar region 06/19/2015   Chronic pain syndrome 10/30/2014    Orientation RESPIRATION BLADDER Height & Weight     Self, Place  Normal External catheter, Continent Weight: 128.4 kg Height:  5\' 8"  (172.7 cm)  BEHAVIORAL SYMPTOMS/MOOD NEUROLOGICAL BOWEL NUTRITION STATUS  Other (Comment) (n/a)  (n/a) Colostomy, Incontinent (Small sausage like with cracks) Diet (2 gram)  AMBULATORY STATUS COMMUNICATION OF NEEDS Skin   Limited Assist Verbally Normal                       Personal Care Assistance Level of Assistance  Bathing, Feeding, Dressing Bathing Assistance: Limited assistance Feeding assistance: Limited assistance Dressing Assistance: Limited assistance     Functional Limitations Info  Sight, Hearing Sight Info: Adequate Hearing Info: Adequate      SPECIAL CARE FACTORS FREQUENCY  PT (By licensed PT), OT (By licensed OT)     PT Frequency: Min 2x weekly OT Frequency: Min 2x weekly            Contractures  Contractures Info: Not present    Additional Factors Info  Code Status Code Status Info: FULL             Current Medications (02/04/2023):  This is the current hospital active medication list Current Facility-Administered Medications  Medication Dose Route Frequency Provider Last Rate Last Admin   acetaminophen (TYLENOL)  tablet 650 mg  650 mg Oral Q6H PRN Ronnald Ramp, RPH   650 mg at 02/03/23 0454   Or   acetaminophen (TYLENOL) suppository 650 mg  650 mg Rectal Q6H PRN Ronnald Ramp, RPH       apixaban Everlene Balls) tablet 5 mg  5 mg Oral BID Andris Baumann, MD   5 mg at 02/04/23 0831   calcium carbonate (OS-CAL - dosed in mg of elemental calcium) tablet 2,500 mg  2 tablet Oral Q breakfast Lindajo Royal V, MD   2,500 mg at 02/04/23 0830   cholecalciferol (VITAMIN D3) 25 MCG (1000 UNIT) tablet 1,000 Units  1,000 Units Oral Daily Andris Baumann, MD   1,000 Units at 02/04/23 0830   copper tablet 2 mg  2 mg Oral Daily Arnetha Courser, MD   2 mg at 02/04/23 1106   cyanocobalamin (VITAMIN B12) tablet 1,000 mcg  1,000 mcg Oral Daily Lindajo Royal V, MD   1,000 mcg at 02/04/23 0830   docusate sodium (COLACE) capsule 100 mg  100 mg Oral Daily Lindajo Royal V, MD   100 mg at 02/04/23 0981   ferrous sulfate tablet 325 mg  325 mg Oral BID WC Andris Baumann, MD   325 mg at 02/04/23 1914   folic acid (FOLVITE) tablet 1 mg  1 mg Oral Daily Lindajo Royal V, MD   1 mg at 02/04/23 0831   gabapentin (NEURONTIN) capsule 300 mg  300 mg Oral BID Andris Baumann, MD   300 mg at 02/04/23 0831   hydrocortisone (CORTEF) tablet 15 mg  15 mg Oral q morning Lindajo Royal V, MD   15 mg at 02/04/23 0830   hydrocortisone (CORTEF) tablet 5 mg  5 mg Oral QPM Lindajo Royal V, MD   5 mg at 02/03/23 1734   ipratropium-albuterol (DUONEB) 0.5-2.5 (3) MG/3ML nebulizer solution 3 mL  3 mL Nebulization Q6H PRN Andris Baumann, MD       lactose free nutrition (BOOST PLUS) liquid 237 mL  237 mL Oral TID BM Rosezetta Schlatter T, MD   237 mL at 02/04/23 1501   magic mouthwash w/lidocaine  15 mL Oral TID PRN Arnetha Courser, MD   15 mL at 02/03/23 1734   melatonin tablet 5 mg  5 mg Oral QHS PRN Andris Baumann, MD   5 mg at 02/03/23 2130   metoprolol tartrate (LOPRESSOR) tablet 25 mg  25 mg Oral BID Arnetha Courser, MD       midodrine (PROAMATINE) tablet 10 mg  10 mg  Oral TID WC Andris Baumann, MD   10 mg at 02/04/23 1244   multivitamin with minerals tablet 1 tablet  1 tablet Oral Daily Loyce Dys, MD   1 tablet at 02/04/23 0831   nortriptyline (PAMELOR) capsule 50 mg  50 mg Oral QHS Andris Baumann, MD   50 mg at 02/03/23 2131   nystatin cream (MYCOSTATIN) 1 Application  1 Application Topical BID Andris Baumann, MD   1 Application at 02/04/23 0835   ondansetron (ZOFRAN) tablet 4 mg  4 mg Oral Q6H PRN Andris Baumann,  MD       Or   ondansetron (ZOFRAN) injection 4 mg  4 mg Intravenous Q6H PRN Andris Baumann, MD       oxyCODONE-acetaminophen (PERCOCET/ROXICET) 5-325 MG per tablet 1 tablet  1 tablet Oral Q4H PRN Andris Baumann, MD   1 tablet at 02/04/23 1259   And   oxyCODONE (Oxy IR/ROXICODONE) immediate release tablet 5 mg  5 mg Oral Q4H PRN Andris Baumann, MD   5 mg at 02/03/23 2319   polyethylene glycol (MIRALAX / GLYCOLAX) packet 17 g  17 g Oral Daily PRN Andris Baumann, MD       potassium chloride SA (KLOR-CON M) CR tablet 20 mEq  20 mEq Oral Daily Lindajo Royal V, MD   20 mEq at 02/04/23 0831   thiamine (VITAMIN B1) tablet 100 mg  100 mg Oral Daily Lindajo Royal V, MD   100 mg at 02/04/23 0830   torsemide (DEMADEX) tablet 20 mg  20 mg Oral Daily Arnetha Courser, MD   20 mg at 02/04/23 0831   vitamin A capsule 10,000 Units  10,000 Units Oral Daily Arnetha Courser, MD   10,000 Units at 02/04/23 1106   zinc sulfate (50mg  elemental zinc) capsule 220 mg  220 mg Oral Daily Arnetha Courser, MD   220 mg at 02/04/23 1106     Discharge Medications: Please see discharge summary for a list of discharge medications.  Relevant Imaging Results:  Relevant Lab Results:   Additional Information SSN [id number]  Truddie Hidden, RN

## 2023-02-04 NOTE — Discharge Summary (Signed)
Physician Discharge Summary   Patient: Wendy Sanchez MRN: [id number] DOB: 04-Aug-1950  Admit date:     01/29/2023  Discharge date: 02/04/23  Discharge Physician: Arnetha Courser   PCP: Gorden Harms, PA-C   Recommendations at discharge:  Please obtain CBC and BMP on follow-up Patient with multiple nutritional deficiencies-being given multiple supplements as advised by our dietitian, please monitor and titrate. Patient was also started on metoprolol due to persistent mild sinus tachycardia, she is also on midodrine to support with blood pressure Follow-up with primary care provider within a week  Discharge Diagnoses: Principal Problem:   Sepsis (HCC) Active Problems:   Hypotension   Acute pharyngitis   Recurrent UTI   Otitis externa   Chronic pain syndrome   Peripheral neuropathy   Giant cell arteritis (HCC)   HFrEF (heart failure with reduced ejection fraction) (HCC)   Colostomy in place [facility])   History of pulmonary embolus 2020(PE)   Morbid obesity with BMI of 45.0-49.9, adult (HCC)   History of renal cell carcinoma   Chronic, continuous use of opioids   Current chronic use of systemic steroids   Chronic obstructive pulmonary disease (COPD) (HCC)   Secondary adrenal insufficiency (HCC)   Severe protein-calorie malnutrition (HCC)  Resolved Problems:   * No resolved hospital problems. [facility] Course: Wendy Sanchez is a 73 y.o. female nursing home resident, non ambulant at baseline, with medical history significant for HFrEF (LVEF 40-45% in Sept 2024),  PE in July 2020 on Eliquis, perforated sigmoid diverticulitis s/p colectomy and end colostomy in Sept 2022, COPD, giant cell arteritis on chronic prednisone,secondary adrenal insufficienc with chronic hypotension on midodrine, COPD,   recurrent UTIs, recently hospitalized at [facility] from 1/10 -01/27/2023 with E. coli UTI and RLL pneumonia, as well as otitis externa, discharged to SNF, who was sent from the facility via EMS for  code sepsis.  With EMS, patient febrile to 100.2, tachycardic to 125 tachypneic to 35 and hypotensive to 90/50.  Patient complains of generalized weakness and having a sore throat for the past 3 days before presentation.  ED course and data review: Tmax in the ED 99.9, heart rate 125 respirations 30 O2 sat 99% on room air and BP 102/76.  Initial concern of sepsis, patient received antibiotics.  All cultures remain negative so no obvious source of infection-makes sepsis ruled out.  Patient do have persistent mild tachycardia, started on metoprolol to help.  She is also on midodrine for blood pressure support due to borderline hypotension.  Persistent lower extremity edema likely with severe protein caloric malnutrition when albumin was very low.  Our dietitian saw her and they placed some recommendations and started on multiple supplements as found to have multiple nutritional deficiencies.  Patient will continue on that supplement and need to have a close follow-up with primary care provider for further management.  Patient will continue with rest of her medications and need to have close follow-up with her providers.  Assessment and Plan: * Sepsis (HCC) High risk for severe infection due to chronic immunosuppression Initial concern of sepsis with leukocytosis, tachycardia and tachypnea.  No obvious source of infection found so sepsis ruled out.  Patient with history of recent UTI and was given ceftriaxone to complete the course for concern of her chronic immunosuppression.  Leukocytosis can be due to steroid use which seems improving now. She completed the course of antibiotic and all cultures remain negative.  Hypotension Adrenal insufficiency, secondary to chronic steroids Chronic systemic steroid use  Suspect chronic versus related to sepsis  continue midodrine 10 mg 3 times daily Continue hydrocortisone 5 mg, 3 tabs every morning 1 tablet every afternoon   Acute pharyngitis No evidence  of thrush on exam Follow strep test  Otitis externa Recently diagnosed during admission to [facility] Continue ofloxacin 0.3% otic solution until 1/20  Recurrent UTI Recent E. coli UTI, hospitalized at [facility] from 1/10-1/15/25, on Keflex until 1/23 Completed the course of antibiotic and repeat cultures were negative   Peripheral neuropathy Vitamin and mineral deficiencies (copper, zinc,Vitamin A, B6) Prior history of gastric bypass Continue nortriptyline and gabapentin Continue thiamine, pediatric multivitamin, folic acid, ferrous sulfate, vitamin B12 vitamin D3 25, vitamin C  Chronic pain syndrome Chronic opioid use Continue Lidoderm patch, gabapentin 300 twice daily, oxycodone 10/325 every 4 as needed  Giant cell arteritis (HCC) Previously on prednisone Currently on hydrocortisone 5 mg 3 tablets in the a.m. 1 in the PM Followed by rheumatology at [facility]  HFrEF (heart failure with reduced ejection fraction) (HCC) LVEF 40-45% in Sept 2024 Clinically euvolemic GDMT limited by hypotension Lasix and torsemide both were listed in home medications, continuing torsemide at 20 mg daily  History of pulmonary embolus 2020(PE) Chronic anticoagulation Continue Eliquis  Colostomy in place [facility]) Ostomy care  Morbid obesity with BMI of 45.0-49.9, adult (HCC) Complicating factor to overall prognosis and care  History of renal cell carcinoma No acute issues  Chronic obstructive pulmonary disease (COPD) (HCC) Not acutely exacerbated Continue home inhalers     Pain control - [place] Controlled Substance Reporting System database was reviewed. and patient was instructed, not to drive, operate heavy machinery, perform activities at heights, swimming or participation in water activities or provide baby-sitting services while on Pain, Sleep and Anxiety Medications; until their outpatient Physician has advised to do so again. Also recommended to not to take more than prescribed Pain, Sleep and  Anxiety Medications.  Consultants: None Procedures performed: None Disposition: Skilled nursing facility Diet recommendation:  Discharge Diet Orders (From admission, onward)     Start     Ordered   02/04/23 0000  Diet - low sodium heart healthy        02/04/23 1309           Regular diet DISCHARGE MEDICATION: Allergies as of 02/04/2023       Reactions   Hydrocodone-acetaminophen Swelling   hands   Iodine Anaphylaxis, Swelling   Iv dye 02/03/21-Ispoke to patient and she had IV contrast  X4 last year and has had no problem. ( Last CT abdomen with contrast was on 01/02/21.-    Other Other (See Comments)   ALLERGY TO METAL - BLACKENS SKIN AND CAUSES A RASH    Tape Swelling, Other (See Comments)   Skin comes off.  Paper tape is ok tegaderm OK   Peanut Oil Other (See Comments)   ** Nuts cause runny nose   Shellfish Allergy Nausea And Vomiting, Swelling   Episode of GI infection after eating clam chowder. Still eats shrimp and other seafood        Medication List     STOP taking these medications    amoxicillin 500 MG capsule Commonly known as: AMOXIL   cephALEXin 500 MG capsule Commonly known as: KEFLEX   fluconazole 100 MG tablet Commonly known as: DIFLUCAN   furosemide 20 MG tablet Commonly known as: LASIX   nystatin 100000 UNIT/ML suspension Commonly known as: MYCOSTATIN   ofloxacin 0.3 % OTIC solution Commonly known as: FLOXIN  pantoprazole 40 MG tablet Commonly known as: PROTONIX   Potassium Chloride ER 20 MEQ Tbcr   predniSONE 10 MG tablet Commonly known as: DELTASONE   sulfamethoxazole-trimethoprim 800-160 MG tablet Commonly known as: BACTRIM DS       TAKE these medications    acetaminophen 325 MG tablet Commonly known as: TYLENOL Take 2 tablets (650 mg total) by mouth every 6 (six) hours as needed for mild pain or fever. What changed: when to take this   albuterol 108 (90 Base) MCG/ACT inhaler Commonly known as: VENTOLIN HFA Inhale  2 puffs into the lungs every 6 (six) hours as needed for wheezing.   alendronate 35 MG tablet Commonly known as: FOSAMAX Take 1 tablet (35 mg total) by mouth every 7 (seven) days Take with a full glass of water. Do not lie down for the next 30 min.   apixaban 5 MG Tabs tablet Commonly known as: ELIQUIS Take 1 tablet (5 mg total) by mouth 2 (two) times daily.   ascorbic acid 500 MG tablet Commonly known as: VITAMIN C Take 500 mg by mouth daily.   calcium carbonate 1250 (500 Ca) MG tablet Commonly known as: OS-CAL - dosed in mg of elemental calcium Take 2 tablets by mouth in the morning.   Carboxymethylcellulose Sod PF 1 % Gel Administer 2 drops to both eyes Three (3) times a day.   copper tablet Take 1 tablet (2 mg total) by mouth daily. Start taking on: February 05, 2023   cyanocobalamin 1000 MCG tablet Commonly known as: VITAMIN B12 Take 1 tablet by mouth daily.   diphenhydrAMINE 25 mg capsule Commonly known as: BENADRYL Take 25 mg by mouth every 6 (six) hours as needed for itching or allergies.   docusate sodium 100 MG capsule Commonly known as: COLACE Take 100 mg by mouth 2 (two) times daily as needed for mild constipation.   estradiol 0.1 MG/GM vaginal cream Commonly known as: ESTRACE Insert 2 g into the vagina daily. For the first two weeks, apply every day. After then, apply 2-3 times a week indefinitely   ferrous sulfate 325 (65 FE) MG tablet Take 325 mg by mouth 2 (two) times daily with a meal.   fluticasone 50 MCG/ACT nasal spray Commonly known as: FLONASE Place 1 spray into both nostrils daily.   folic acid 1 MG tablet Commonly known as: FOLVITE Take 1 mg by mouth daily.   gabapentin 300 MG capsule Commonly known as: Neurontin Take 1 capsule (300 mg total) by mouth 2 (two) times daily. What changed: Another medication with the same name was removed. Continue taking this medication, and follow the directions you see here.   hydrocortisone 5 MG  tablet Commonly known as: CORTEF Take 3 tablets (15 mg total) by mouth every morning AND 1 tablet (5 mg total) every afternoon after lunch and before dinner   ipratropium-albuterol 0.5-2.5 (3) MG/3ML Soln Commonly known as: DUONEB Take 3 mLs by nebulization every 6 (six) hours as needed (shortness of breath).   lactose free nutrition Liqd Take 237 mLs by mouth 3 (three) times daily between meals.   levocetirizine 5 MG tablet Commonly known as: XYZAL Take 10 mg by mouth every evening.   lidocaine 4 %   magic mouthwash w/lidocaine Soln Take 15 mLs by mouth 3 (three) times daily as needed for mouth pain. Suspension contains equal amounts of Maalox Extra Strength, nystatin, diphenhydramine and lidocaine.   melatonin 3 MG Tabs tablet Take 3 mg by mouth at bedtime as  needed.   metoprolol tartrate 25 MG tablet Commonly known as: LOPRESSOR Take 1 tablet (25 mg total) by mouth 2 (two) times daily.   midodrine 10 MG tablet Commonly known as: PROAMATINE Take 5 mg by mouth 3 (three) times daily.   montelukast 10 MG tablet Commonly known as: SINGULAIR Take 10 mg by mouth daily.   mupirocin ointment 2 % Commonly known as: BACTROBAN Apply 1 Application topically 3 (three) times daily as needed.   nortriptyline 50 MG capsule Commonly known as: PAMELOR Take 50 mg by mouth at bedtime.   nystatin cream Commonly known as: MYCOSTATIN Apply 1 application  topically 4 (four) times daily. (Apply under skin folds)   omeprazole 20 MG tablet Commonly known as: PRILOSEC OTC Take 20 mg by mouth daily.   ondansetron 4 MG tablet Commonly known as: ZOFRAN Take 1 tablet (4 mg total) by mouth every 6 (six) hours as needed for nausea.   oxyCODONE-acetaminophen 10-325 MG tablet Commonly known as: Percocet Take 1 tablet by mouth every 4 (four) hours as needed for pain.   polyethylene glycol 17 g packet Commonly known as: MIRALAX / GLYCOLAX Take 17 g by mouth daily as needed for moderate  constipation.   potassium chloride SA 20 MEQ tablet Commonly known as: KLOR-CON M Take 20 mEq by mouth 2 (two) times daily.   thiamine 100 MG tablet Commonly known as: VITAMIN B1 Take 1 tablet by mouth daily.   torsemide 20 MG tablet Commonly known as: DEMADEX Take 1 tablet (20 mg total) by mouth daily. Start taking on: February 05, 2023 What changed:  medication strength how much to take   triamcinolone cream 0.1 % Commonly known as: KENALOG Apply topically daily as needed.   vitamin A 3 MG (10000 UNITS) capsule Take 1 capsule (10,000 Units total) by mouth daily. For 60 days Start taking on: February 05, 2023   Vitamin D-1000 Max St 25 MCG (1000 UT) tablet Generic drug: Cholecalciferol Take by mouth.   zinc sulfate (50mg  elemental zinc) 220 (50 Zn) MG capsule Take 1 capsule (220 mg total) by mouth daily. For 14 days Start taking on: February 05, 2023        Follow-up Information     Gorden Harms, [place]. Schedule an appointment as soon as possible for a visit in 1 week(s).   Specialty: Physician Assistant Contact information: [address] [place] [address] [phone]                Discharge Exam: Ceasar Mons Weights   01/30/23 0123 02/02/23 0600  Weight: 124.7 kg 128.4 kg   General.  Morbidly obese lady, in no acute distress. Pulmonary.  Lungs clear bilaterally, normal respiratory effort. CV.  Regular rate and rhythm, no JVD, rub or murmur. Abdomen.  Soft, nontender, nondistended, BS positive. CNS.  Alert and oriented .  No focal neurologic deficit. Extremities.  1+ LE edema, no cyanosis, pulses intact and symmetrical.  Condition at discharge: stable  The results of significant diagnostics from this hospitalization (including imaging, microbiology, ancillary and laboratory) are listed below for reference.   Imaging Studies: DG Chest Portable 1 View Result Date: 01/29/2023 CLINICAL DATA:  Possible sepsis. On antibiotics for UTI. Not improving.  EXAM: PORTABLE CHEST 1 VIEW COMPARISON:  01/30/2021 FINDINGS: Stable cardiomegaly. Aortic atherosclerotic calcification. Left basilar atelectasis. No focal consolidation, pleural effusion, or pneumothorax. No displaced rib fractures. IMPRESSION: No active disease. Electronically Signed   By: Minerva Fester M.D.   On: 01/29/2023 23:06  Microbiology: Results for orders placed or performed during the hospital encounter of 01/29/23  Urine Culture     Status: None   Collection Time: 01/29/23 10:38 PM   Specimen: Urine, Random  Result Value Ref Range Status   Specimen Description   Final    URINE, RANDOM Performed at [facility], [address], [place], [place] [address]    Special Requests   Final    NONE Reflexed from [id number] Performed at [facility], [address], [place], [place] [address]    Culture   Final    NO GROWTH Performed at [facility], [address], [place], [place] [address]    Report Status 01/31/2023 FINAL  Final  Blood culture (routine x 2)     Status: None   Collection Time: 01/29/23 10:48 PM   Specimen: BLOOD RIGHT HAND  Result Value Ref Range Status   Specimen Description BLOOD RIGHT HAND  Final   Special Requests   Final    BOTTLES DRAWN AEROBIC AND ANAEROBIC Blood Culture results may not be optimal due to an inadequate volume of blood received in culture bottles   Culture   Final    NO GROWTH 5 DAYS Performed at [facility], [address] [place], [place] [address]    Report Status 02/03/2023 FINAL  Final  Blood culture (routine x 2)     Status: None   Collection Time: 01/29/23 10:53 PM   Specimen: BLOOD  Result Value Ref Range Status   Specimen Description BLOOD BLOOD LEFT HAND  Final   Special Requests   Final    BOTTLES DRAWN AEROBIC AND ANAEROBIC Blood Culture results may not be optimal due to an inadequate volume of blood received in culture bottles   Culture   Final    NO GROWTH 5 DAYS Performed at  [facility], [address] [place], [place] [address]    Report Status 02/03/2023 FINAL  Final  Resp panel by RT-PCR (RSV, Flu A&B, Covid) Anterior Nasal Swab     Status: None   Collection Time: 01/29/23 11:31 PM   Specimen: Anterior Nasal Swab  Result Value Ref Range Status   SARS Coronavirus 2 by RT PCR NEGATIVE NEGATIVE Final    Comment: (NOTE) SARS-CoV-2 target nucleic acids are NOT DETECTED.  The SARS-CoV-2 RNA is generally detectable in upper respiratory specimens during the acute phase of infection. The lowest concentration of SARS-CoV-2 viral copies this assay can detect is 138 copies/mL. A negative result does not preclude SARS-Cov-2 infection and should not be used as the sole basis for treatment or other patient management decisions. A negative result may occur with  improper specimen collection/handling, submission of specimen other than nasopharyngeal swab, presence of viral mutation(s) within the areas targeted by this assay, and inadequate number of viral copies(<138 copies/mL). A negative result must be combined with clinical observations, patient history, and epidemiological information. The expected result is Negative.  Fact Sheet for Patients:  BloggerCourse.com  Fact Sheet for Healthcare Providers:  SeriousBroker.it  This test is no t yet approved or cleared by the [place] FDA and  has been authorized for detection and/or diagnosis of SARS-CoV-2 by FDA under an Emergency Use Authorization (EUA). This EUA will remain  in effect (meaning this test can be used) for the duration of the COVID-19 declaration under Section 564(b)(1) of the Act, 21 U.S.C.section 360bbb-3(b)(1), unless the authorization is terminated  or revoked sooner.  Influenza A by PCR NEGATIVE NEGATIVE Final   Influenza B by PCR NEGATIVE NEGATIVE Final    Comment: (NOTE) The Xpert Xpress SARS-CoV-2/FLU/RSV plus assay is  intended as an aid in the diagnosis of influenza from Nasopharyngeal swab specimens and should not be used as a sole basis for treatment. Nasal washings and aspirates are unacceptable for Xpert Xpress SARS-CoV-2/FLU/RSV testing.  Fact Sheet for Patients: BloggerCourse.com  Fact Sheet for Healthcare Providers: SeriousBroker.it  This test is not yet approved or cleared by the [place] FDA and has been authorized for detection and/or diagnosis of SARS-CoV-2 by FDA under an Emergency Use Authorization (EUA). This EUA will remain in effect (meaning this test can be used) for the duration of the COVID-19 declaration under Section 564(b)(1) of the Act, 21 U.S.C. section 360bbb-3(b)(1), unless the authorization is terminated or revoked.     Resp Syncytial Virus by PCR NEGATIVE NEGATIVE Final    Comment: (NOTE) Fact Sheet for Patients: BloggerCourse.com  Fact Sheet for Healthcare Providers: SeriousBroker.it  This test is not yet approved or cleared by the [place] FDA and has been authorized for detection and/or diagnosis of SARS-CoV-2 by FDA under an Emergency Use Authorization (EUA). This EUA will remain in effect (meaning this test can be used) for the duration of the COVID-19 declaration under Section 564(b)(1) of the Act, 21 U.S.C. section 360bbb-3(b)(1), unless the authorization is terminated or revoked.  Performed at [facility], [address] [place], [place] [address]   Group A Strep by PCR     Status: None   Collection Time: 01/30/23  1:57 AM   Specimen: Throat; Sterile Swab  Result Value Ref Range Status   Group A Strep by PCR NOT DETECTED NOT DETECTED Final    Comment: Performed at [facility], [address] [place], [place] [address]    Labs: CBC: Recent Labs  Lab 01/29/23 2215 01/30/23 0549 01/31/23 1308 02/01/23 0602  02/02/23 0519 02/03/23 0529 02/04/23 0523  WBC 20.7*   < > 10.4 8.3 10.5 14.7* 12.3*  NEUTROABS 18.4*  --  8.3* 6.2 8.4* 12.4*  --   HGB 9.3*   < > 8.9* 8.5* 8.3* 8.8* 9.2*  HCT 29.1*   < > 27.2* 25.8* 25.3* 27.4* 27.3*  MCV 87.9   < > 87.2 84.0 85.2 85.9 83.2  PLT 633*   < > 606* 619* 576* 681* 637*   < > = values in this interval not displayed.   Basic Metabolic Panel: Recent Labs  Lab 01/31/23 0638 02/01/23 0602 02/02/23 0519 02/03/23 0529 02/04/23 0523  NA 135 135 134* 134* 133*  K 3.8 4.4 4.0 4.1 4.0  CL 108 108 106 104 102  CO2 21* 21* 22 23 23   GLUCOSE 75 74 95 97 84  BUN 16 16 15 13 15   CREATININE 0.62 0.61 0.55 0.57 0.52  CALCIUM 8.1* 8.2* 8.1* 8.6* 8.5*  MG  --   --  2.2  --   --    Liver Function Tests: Recent Labs  Lab 01/29/23 2215 01/30/23 0549  AST 33 23  ALT 23 21  ALKPHOS 135* 127*  BILITOT 0.8 0.6  PROT 5.0* 4.6*  ALBUMIN <1.5* <1.5*   CBG: No results for input(s): "GLUCAP" in the last 168 hours.  Discharge time spent: greater than 30 minutes.  This record has been created using Conservation officer, historic buildings. Errors have been sought and corrected,but may not always be located. Such creation errors do not reflect  on the standard of care.   Signed: Arnetha Courser, MD [facility] 02/04/2023

## 2023-02-04 NOTE — Plan of Care (Signed)
  Problem: Fluid Volume: Goal: Hemodynamic stability will improve Outcome: Progressing   Problem: Coping: Goal: Level of anxiety will decrease Outcome: Progressing   Problem: Elimination: Goal: Will not experience complications related to urinary retention Outcome: Progressing

## 2023-02-04 NOTE — TOC Transition Note (Signed)
Transition of Care [facility]) - Discharge Note   Patient Details  Name: Wendy Sanchez MRN: [id number] Date of Birth: 09/06/1950  Transition of Care [facility]) CM/SW Contact:  Truddie Hidden, RN Phone Number: 02/04/2023, 3:18 PM   Clinical Narrative:    Sherron Monday with Dorathy Daft in admissions at  [facility] Per facility patient admission confirmed for today. Patient assigned room # 609 Nurse will call report to [phone] Face sheet and medical necessity forms printed to the floor to be added to the EMS pack EMS arranged " She's 7th."  Discharge summary and SNF transfer report sent in HUB.  Nurse, and family notified spoke with TOC signing off.          Patient Goals and CMS Choice            Discharge Placement                       Discharge Plan and Services Additional resources added to the After Visit Summary for                                       Social Drivers of Health (SDOH) Interventions SDOH Screenings   Food Insecurity: No Food Insecurity (01/31/2023)  Housing: Low Risk  (01/31/2023)  Transportation Needs: No Transportation Needs (01/31/2023)  Recent Concern: Transportation Needs - Unmet Transportation Needs (01/24/2023)   Received from [facility]  Utilities: Not At Risk (01/31/2023)  Depression (PHQ2-9): Low Risk  (07/01/2022)  Financial Resource Strain: Low Risk  (12/26/2020)   Received from [facility]  Social Connections: Unknown (01/31/2023)  Tobacco Use: High Risk (01/29/2023)     Readmission Risk Interventions    02/03/2021    9:12 AM  Readmission Risk Prevention Plan  Transportation Screening Complete  Medication Review (RN Care Manager) Complete  PCP or Specialist appointment within 3-5 days of discharge Complete  HRI or Home Care Consult Complete  Palliative Care Screening Not Applicable  Skilled Nursing Facility Complete

## 2023-02-04 NOTE — Progress Notes (Signed)
Nutrition Follow-up  DOCUMENTATION CODES:   Morbid obesity  INTERVENTION:   -Continue 2 gram sodium diet -Continue Boost Plus TID, each supplement provides 360 kcals and 14 grams protein -Continue MVI with minerals daily -Continue 2500 mg calcium carbonate BID per MD order -Draw labs and monitor for signs of micronutrient deficiency due to long standing gastric band surgery: FE, folic acid, thiamine, copper, zinc, vitamin B-12, vitamin D, vitamin A, vitamin E, and vitamin K -325 mg ferrous sulfate daily (for iron deficiency) -220 mg zinc sulfate daily x 14 days (for zinc deficiency) -10,000 international units vitamin A daily x 60 days (for vitamin A deficiency) -2 mg copper daily x 30 days (for copper deficiency)   NUTRITION DIAGNOSIS:   Increased nutrient needs related to chronic illness (CHF) as evidenced by estimated needs.  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  Reviewed I/O's: -2.3 L x 24 hours and -24 ml since admission   Pt with improved oral intake. Noted meal completions 100%. She is drinking Boost Plus supplements.   Per TOC notes, possible d/c back to [facility] today.   Medications reviewed and include vitamin B-12, vitamin D3, colace, neurontin, protamine, potassium chloride, and demadex.   Labs reviewed: Na: 133, CBGS: 128 (inpatient orders for glycemic control are none).  Iron: 16, folic acid WDL, thiamine pending, copper: 63, zinc: 29, vitamin B-12: 178, vitamin E WDL,  vitamin D WDL, vitamin A: 9.4. Vitamin K pending.   Diet Order:   Diet Order             Diet 2 gram sodium Fluid consistency: Thin  Diet effective now                   EDUCATION NEEDS:   Education needs have been addressed  Skin:  Skin Assessment: Reviewed RN Assessment  Last BM:  01/31/23 (type 3 via colostomy)  Height:   Ht Readings from Last 1 Encounters:  01/30/23 5\' 8"  (1.727 m)    Weight:   Wt Readings from Last 1 Encounters:  02/02/23 128.4 kg    Ideal Body Weight:  63.6 kg  BMI:  Body mass index is 43.04 kg/m.  Estimated Nutritional Needs:   Kcal:  1700-1900  Protein:  90-105 grams  Fluid:  > 1.7 L    Levada Schilling, RD, LDN, CDCES Registered Dietitian III Certified Diabetes Care and Education Specialist If unable to reach this RD, please use "RD Inpatient" group chat on secure chat between hours of 8am-4 pm daily

## 2023-02-05 LAB — VITAMIN K1, SERUM: VITAMIN K1: 0.21 ng/mL (ref 0.10–2.20)

## 2023-02-05 LAB — VITAMIN B1: Vitamin B1 (Thiamine): 113.9 nmol/L (ref 66.5–200.0)

## 2023-03-03 ENCOUNTER — Encounter: Payer: Self-pay | Admitting: Anesthesiology

## 2023-03-03 ENCOUNTER — Ambulatory Visit: Payer: Medicare Other | Attending: Anesthesiology | Admitting: Anesthesiology

## 2023-03-03 DIAGNOSIS — M5442 Lumbago with sciatica, left side: Secondary | ICD-10-CM | POA: Diagnosis not present

## 2023-03-03 DIAGNOSIS — M25552 Pain in left hip: Secondary | ICD-10-CM

## 2023-03-03 DIAGNOSIS — M25551 Pain in right hip: Secondary | ICD-10-CM

## 2023-03-03 DIAGNOSIS — M19011 Primary osteoarthritis, right shoulder: Secondary | ICD-10-CM

## 2023-03-03 DIAGNOSIS — F1190 Opioid use, unspecified, uncomplicated: Secondary | ICD-10-CM

## 2023-03-03 DIAGNOSIS — I5032 Chronic diastolic (congestive) heart failure: Secondary | ICD-10-CM

## 2023-03-03 DIAGNOSIS — M48062 Spinal stenosis, lumbar region with neurogenic claudication: Secondary | ICD-10-CM

## 2023-03-03 DIAGNOSIS — M316 Other giant cell arteritis: Secondary | ICD-10-CM

## 2023-03-03 DIAGNOSIS — M5441 Lumbago with sciatica, right side: Secondary | ICD-10-CM | POA: Diagnosis not present

## 2023-03-03 DIAGNOSIS — G8929 Other chronic pain: Secondary | ICD-10-CM

## 2023-03-03 DIAGNOSIS — Z79891 Long term (current) use of opiate analgesic: Secondary | ICD-10-CM

## 2023-03-03 DIAGNOSIS — G894 Chronic pain syndrome: Secondary | ICD-10-CM

## 2023-03-03 MED ORDER — OXYCODONE-ACETAMINOPHEN 10-325 MG PO TABS
1.0000 | ORAL_TABLET | Freq: Four times a day (QID) | ORAL | 0 refills | Status: DC | PRN
Start: 2023-04-02 — End: 2023-03-27

## 2023-03-03 MED ORDER — OXYCODONE-ACETAMINOPHEN 10-325 MG PO TABS: 1.0000 | ORAL_TABLET | Freq: Four times a day (QID) | ORAL | 0 refills | Status: AC | PRN

## 2023-03-03 MED ORDER — GABAPENTIN 300 MG PO CAPS: 300.0000 mg | ORAL_CAPSULE | Freq: Three times a day (TID) | ORAL | 3 refills | Status: AC

## 2023-03-03 NOTE — Progress Notes (Signed)
 Virtual Visit via Telephone Note  I connected with Wendy Sanchez on 03/03/23 at 11:05 AM EST by telephone and verified that I am speaking with the correct person using two identifiers.  Location: Patient: Home Provider: [facility]   I discussed the limitations, risks, security and privacy concerns of performing an evaluation and management service by telephone and the availability of in person appointments. I also discussed with the patient that there may be a patient responsible charge related to this service. The patient expressed understanding and agreed to proceed.   History of Present Illness: I spoke with Wendy Sanchez wife via telephone as we are unable link for the video portion conference.  She reports that she has been with covering in her rehab facility.  Her diastolic congestive heart failure and kidney issues have improved as has her sepsis issue.  She has been using Percocet 3-4 times a day at the facility and noticed that her back pain is responding well as is her diffuse osteoarthritic pain.  No side effects of the medication are noted.  The regimen is working for keeping her pain under control and enabling her to try and be more active.  Otherwise she is in her usual state of health.  Additionally, she is taking her gabapentin 300 mg tablets 3 times a day and this combination has been stable with no side effects reported.  Lower extremity strength function bowel and bladder function have been stable as of recent.  Review of systems: General: No fevers or chills Pulmonary: No shortness of breath or dyspnea Cardiac: No angina or palpitations or lightheadedness GI: No abdominal pain or constipation Psych: No depression    Observations/Objective:  Current Outpatient Medications:    oxyCODONE-acetaminophen (PERCOCET) 10-325 MG tablet, Take 1 tablet by mouth every 6 (six) hours as needed for pain., Disp: 120 tablet, Rfl: 0   [START ON 04/02/2023] oxyCODONE-acetaminophen  (PERCOCET) 10-325 MG tablet, Take 1 tablet by mouth every 6 (six) hours as needed for pain., Disp: 120 tablet, Rfl: 0   acetaminophen (TYLENOL) 325 MG tablet, Take 2 tablets (650 mg total) by mouth every 6 (six) hours as needed for mild pain or fever. (Patient taking differently: Take 650 mg by mouth every 8 (eight) hours as needed for mild pain (pain score 1-3) or fever.), Disp: 60 tablet, Rfl: 0   albuterol (VENTOLIN HFA) 108 (90 Base) MCG/ACT inhaler, Inhale 2 puffs into the lungs every 6 (six) hours as needed for wheezing., Disp: , Rfl:    alendronate (FOSAMAX) 35 MG tablet, Take 1 tablet (35 mg total) by mouth every 7 (seven) days Take with a full glass of water. Do not lie down for the next 30 min., Disp: , Rfl:    apixaban (ELIQUIS) 5 MG TABS tablet, Take 1 tablet (5 mg total) by mouth 2 (two) times daily., Disp: 60 tablet, Rfl: 0   ascorbic acid (VITAMIN C) 500 MG tablet, Take 500 mg by mouth daily., Disp: , Rfl:    calcium carbonate (OS-CAL - DOSED IN MG OF ELEMENTAL CALCIUM) 1250 (500 Ca) MG tablet, Take 2 tablets by mouth in the morning., Disp: , Rfl:    Carboxymethylcellulose Sod PF 1 % GEL, Administer 2 drops to both eyes Three (3) times a day., Disp: , Rfl:    Cholecalciferol (VITAMIN D-1000 MAX ST) 25 MCG (1000 UT) tablet, Take by mouth., Disp: , Rfl:    copper tablet, Take 1 tablet (2 mg total) by mouth daily., Disp: , Rfl:  cyanocobalamin (VITAMIN B12) 1000 MCG tablet, Take 1 tablet by mouth daily., Disp: , Rfl:    diphenhydrAMINE (BENADRYL) 25 mg capsule, Take 25 mg by mouth every 6 (six) hours as needed for itching or allergies., Disp: , Rfl:    docusate sodium (COLACE) 100 MG capsule, Take 100 mg by mouth 2 (two) times daily as needed for mild constipation., Disp: , Rfl:    estradiol (ESTRACE) 0.1 MG/GM vaginal cream, Insert 2 g into the vagina daily. For the first two weeks, apply every day. After then, apply 2-3 times a week indefinitely, Disp: , Rfl:    ferrous sulfate 325 (65  FE) MG tablet, Take 325 mg by mouth 2 (two) times daily with a meal., Disp: , Rfl:    fluticasone (FLONASE) 50 MCG/ACT nasal spray, Place 1 spray into both nostrils daily., Disp: , Rfl:    folic acid (FOLVITE) 1 MG tablet, Take 1 mg by mouth daily., Disp: , Rfl:    gabapentin (NEURONTIN) 300 MG capsule, Take 1 capsule (300 mg total) by mouth 3 (three) times daily., Disp: 90 capsule, Rfl: 3   hydrocortisone (CORTEF) 5 MG tablet, Take 3 tablets (15 mg total) by mouth every morning AND 1 tablet (5 mg total) every afternoon after lunch and before dinner, Disp: , Rfl:    ipratropium-albuterol (DUONEB) 0.5-2.5 (3) MG/3ML SOLN, Take 3 mLs by nebulization every 6 (six) hours as needed (shortness of breath). (Patient not taking: Reported on 01/30/2023), Disp: , Rfl:    lactose free nutrition (BOOST PLUS) LIQD, Take 237 mLs by mouth 3 (three) times daily between meals., Disp: , Rfl:    levocetirizine (XYZAL) 5 MG tablet, Take 10 mg by mouth every evening., Disp: , Rfl:    lidocaine 4 %, , Disp: , Rfl:    magic mouthwash w/lidocaine SOLN, Take 15 mLs by mouth 3 (three) times daily as needed for mouth pain. Suspension contains equal amounts of Maalox Extra Strength, nystatin, diphenhydramine and lidocaine., Disp: , Rfl:    melatonin 3 MG TABS tablet, Take 3 mg by mouth at bedtime as needed., Disp: , Rfl:    metoprolol tartrate (LOPRESSOR) 25 MG tablet, Take 1 tablet (25 mg total) by mouth 2 (two) times daily., Disp: , Rfl:    midodrine (PROAMATINE) 10 MG tablet, Take 5 mg by mouth 3 (three) times daily., Disp: , Rfl:    montelukast (SINGULAIR) 10 MG tablet, Take 10 mg by mouth daily., Disp: , Rfl:    mupirocin ointment (BACTROBAN) 2 %, Apply 1 Application topically 3 (three) times daily as needed., Disp: , Rfl:    nortriptyline (PAMELOR) 50 MG capsule, Take 50 mg by mouth at bedtime., Disp: , Rfl:    nystatin cream (MYCOSTATIN), Apply 1 application  topically 4 (four) times daily. (Apply under skin folds), Disp:  , Rfl:    omeprazole (PRILOSEC OTC) 20 MG tablet, Take 20 mg by mouth daily., Disp: , Rfl:    ondansetron (ZOFRAN) 4 MG tablet, Take 1 tablet (4 mg total) by mouth every 6 (six) hours as needed for nausea. (Patient not taking: Reported on 03/17/2022), Disp: 20 tablet, Rfl: 0   polyethylene glycol (MIRALAX / GLYCOLAX) 17 g packet, Take 17 g by mouth daily as needed for moderate constipation., Disp: 14 each, Rfl: 0   potassium chloride SA (KLOR-CON M) 20 MEQ tablet, Take 20 mEq by mouth 2 (two) times daily., Disp: , Rfl:    thiamine (VITAMIN B1) 100 MG tablet, Take 1 tablet by mouth  daily., Disp: , Rfl:    torsemide (DEMADEX) 20 MG tablet, Take 1 tablet (20 mg total) by mouth daily., Disp: , Rfl:    triamcinolone cream (KENALOG) 0.1 %, Apply topically daily as needed., Disp: , Rfl:    vitamin A 3 MG (10000 UNITS) capsule, Take 1 capsule (10,000 Units total) by mouth daily. For 60 days, Disp: , Rfl:    zinc sulfate, 50mg  elemental zinc, 220 (50 Zn) MG capsule, Take 1 capsule (220 mg total) by mouth daily. For 14 days, Disp: , Rfl:    Past Medical History:  Diagnosis Date   Allergic rhinitis    Arthritis    Asthma    problems with fumes and aerosols cause asthma   Chest pain 10/19/2016   CHF (congestive heart failure) (HCC)    Complication of anesthesia    awakens during surgery; has occurred with last 3-4 surgeries    COPD (chronic obstructive pulmonary disease) (HCC)    DVT (deep venous thrombosis) (HCC) 07/13/2018   Right leg   Environmental allergies    fumes    Fibromyalgia    GERD (gastroesophageal reflux disease)    Headache    History of bronchitis    Hx of total knee arthroplasty 12/13/2015   Hyperlipidemia    Hypertension    Morbid obesity with BMI of 45.0-49.9, adult (HCC) 07/25/2015   NICM (nonischemic cardiomyopathy) (HCC)    a. ? PVC mediated;  b. 06/2016 Echo: EF 25-30%, mild LVH, mild LAE, mild AI/MR/TR/PR; c. 08/2016 Cath: nl cors, EF 25%.   Numbness    hands bilat when  driving; improves when not preforming task    Osteoarthritis of left knee 08/02/2015   PE (pulmonary thromboembolism) (HCC) 07/13/2018   Pes anserinus bursitis of left knee 05/18/2016   Presence of right artificial knee joint 05/18/2016   PVC (premature ventricular contraction) 06/04/2017   PVC's (premature ventricular contractions)    a. 11/2016 Amio started;  b. 11/29/16 48h Holter: 57574 PVC's (41%); c. 12/2016 24h Holter: 18040 PVC's (15%); c. 02/2017 48h Holter: 37262 PVC's (41%).   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   Temporal arteritis (HCC) 05/01/2020   Tinnitus    comes and goes    Unilateral primary osteoarthritis, right knee 12/13/2015   Vertigo    none for over 2 yrs   Assessment and Plan: 1. Chronic pain syndrome   2. Chronic, continuous use of opioids   3. Chronic bilateral low back pain with bilateral sciatica   4. Hip pain, bilateral   5. Spinal stenosis of lumbar region with neurogenic claudication   6. Temporal arteritis (HCC)   7. Chronic diastolic CHF (congestive heart failure) (HCC)   8. Chronic pain of right hip   9. Primary osteoarthritis of right shoulder    Unfortunately Benna Dunks has a very complicated history with ongoing issues regarding diastolic congestive failure kidney dysfunction and some primary systemic inflammatory issues.  Her lumbar spinal stenosis and diffuse osteoarthritis pain have been responsive to chronic opioid therapy and this regimen has been helping her.  Unfortunately she has failed more conservative therapy but is getting good relief with the Percocet 10/15/2023 taking this 3-4 times a day.  She has been on this long-term and it is the only modality that it has been effective for her.  She is not a candidate for  interventional therapy.  I have encouraged her to continue efforts at weight loss stretching on a  daily basis and more activity with the warmer weather around the corner.  Continue with the Percocet 3-4 times a day with refill for February 19 and March 21.  Continue gabapentin with return to clinic scheduled in 2 months.  Continue follow-up with her primary care physician for  Follow Up Instructions:    I discussed the assessment and treatment plan with the patient. The patient was provided an opportunity to ask questions and all were answered. The patient agreed with the plan and demonstrated an understanding of the instructions.   The patient was advised to call back or seek an in-person evaluation if the symptoms worsen or if the condition fails to improve as anticipated.  I provided medical care.  30 minutes of non-face-to-face time during this encounter.   Yevette Edwards, MD

## 2023-03-04 NOTE — Patient Instructions (Signed)

## 2023-03-07 ENCOUNTER — Emergency Department: Payer: Medicare Other

## 2023-03-07 ENCOUNTER — Other Ambulatory Visit: Payer: Self-pay

## 2023-03-07 ENCOUNTER — Inpatient Hospital Stay
Admission: EM | Admit: 2023-03-07 | Discharge: 2023-03-27 | DRG: 286 | Disposition: A | Discharge status: Home Health | Payer: Medicare Other | Source: Skilled Nursing Facility | Attending: Internal Medicine | Admitting: Internal Medicine

## 2023-03-07 DIAGNOSIS — E785 Hyperlipidemia, unspecified: Secondary | ICD-10-CM | POA: Diagnosis present

## 2023-03-07 DIAGNOSIS — G4733 Obstructive sleep apnea (adult) (pediatric): Secondary | ICD-10-CM | POA: Diagnosis present

## 2023-03-07 DIAGNOSIS — Z87892 Personal history of anaphylaxis: Secondary | ICD-10-CM

## 2023-03-07 DIAGNOSIS — Z713 Dietary counseling and surveillance: Secondary | ICD-10-CM

## 2023-03-07 DIAGNOSIS — I5A Non-ischemic myocardial injury (non-traumatic): Secondary | ICD-10-CM | POA: Diagnosis present

## 2023-03-07 DIAGNOSIS — I110 Hypertensive heart disease with heart failure: Secondary | ICD-10-CM | POA: Diagnosis present

## 2023-03-07 DIAGNOSIS — I959 Hypotension, unspecified: Secondary | ICD-10-CM | POA: Diagnosis present

## 2023-03-07 DIAGNOSIS — Z91048 Other nonmedicinal substance allergy status: Secondary | ICD-10-CM

## 2023-03-07 DIAGNOSIS — I4719 Other supraventricular tachycardia: Secondary | ICD-10-CM | POA: Diagnosis not present

## 2023-03-07 DIAGNOSIS — Z7983 Long term (current) use of bisphosphonates: Secondary | ICD-10-CM

## 2023-03-07 DIAGNOSIS — I2489 Other forms of acute ischemic heart disease: Secondary | ICD-10-CM | POA: Diagnosis present

## 2023-03-07 DIAGNOSIS — Z820 Family history of epilepsy and other diseases of the nervous system: Secondary | ICD-10-CM

## 2023-03-07 DIAGNOSIS — J449 Chronic obstructive pulmonary disease, unspecified: Secondary | ICD-10-CM | POA: Diagnosis not present

## 2023-03-07 DIAGNOSIS — K5730 Diverticulosis of large intestine without perforation or abscess without bleeding: Secondary | ICD-10-CM | POA: Diagnosis not present

## 2023-03-07 DIAGNOSIS — B974 Respiratory syncytial virus as the cause of diseases classified elsewhere: Secondary | ICD-10-CM | POA: Diagnosis not present

## 2023-03-07 DIAGNOSIS — R109 Unspecified abdominal pain: Secondary | ICD-10-CM | POA: Diagnosis not present

## 2023-03-07 DIAGNOSIS — E2749 Other adrenocortical insufficiency: Secondary | ICD-10-CM | POA: Diagnosis present

## 2023-03-07 DIAGNOSIS — Z86718 Personal history of other venous thrombosis and embolism: Secondary | ICD-10-CM

## 2023-03-07 DIAGNOSIS — D72829 Elevated white blood cell count, unspecified: Secondary | ICD-10-CM | POA: Diagnosis not present

## 2023-03-07 DIAGNOSIS — T380X5A Adverse effect of glucocorticoids and synthetic analogues, initial encounter: Secondary | ICD-10-CM | POA: Diagnosis not present

## 2023-03-07 DIAGNOSIS — R509 Fever, unspecified: Secondary | ICD-10-CM | POA: Diagnosis not present

## 2023-03-07 DIAGNOSIS — E440 Moderate protein-calorie malnutrition: Secondary | ICD-10-CM | POA: Diagnosis present

## 2023-03-07 DIAGNOSIS — F419 Anxiety disorder, unspecified: Secondary | ICD-10-CM | POA: Diagnosis present

## 2023-03-07 DIAGNOSIS — I493 Ventricular premature depolarization: Secondary | ICD-10-CM | POA: Diagnosis present

## 2023-03-07 DIAGNOSIS — Z7189 Other specified counseling: Secondary | ICD-10-CM | POA: Diagnosis not present

## 2023-03-07 DIAGNOSIS — J210 Acute bronchiolitis due to respiratory syncytial virus: Secondary | ICD-10-CM | POA: Diagnosis present

## 2023-03-07 DIAGNOSIS — I5023 Acute on chronic systolic (congestive) heart failure: Secondary | ICD-10-CM | POA: Diagnosis present

## 2023-03-07 DIAGNOSIS — Z8701 Personal history of pneumonia (recurrent): Secondary | ICD-10-CM

## 2023-03-07 DIAGNOSIS — L89322 Pressure ulcer of left buttock, stage 2: Secondary | ICD-10-CM | POA: Diagnosis present

## 2023-03-07 DIAGNOSIS — I2510 Atherosclerotic heart disease of native coronary artery without angina pectoris: Secondary | ICD-10-CM | POA: Diagnosis present

## 2023-03-07 DIAGNOSIS — Z8619 Personal history of other infectious and parasitic diseases: Secondary | ICD-10-CM

## 2023-03-07 DIAGNOSIS — A0472 Enterocolitis due to Clostridium difficile, not specified as recurrent: Secondary | ICD-10-CM | POA: Diagnosis present

## 2023-03-07 DIAGNOSIS — Z515 Encounter for palliative care: Secondary | ICD-10-CM | POA: Diagnosis not present

## 2023-03-07 DIAGNOSIS — Z885 Allergy status to narcotic agent status: Secondary | ICD-10-CM

## 2023-03-07 DIAGNOSIS — M170 Bilateral primary osteoarthritis of knee: Secondary | ICD-10-CM | POA: Diagnosis present

## 2023-03-07 DIAGNOSIS — Z9049 Acquired absence of other specified parts of digestive tract: Secondary | ICD-10-CM

## 2023-03-07 DIAGNOSIS — J440 Chronic obstructive pulmonary disease with acute lower respiratory infection: Secondary | ICD-10-CM | POA: Diagnosis not present

## 2023-03-07 DIAGNOSIS — R9431 Abnormal electrocardiogram [ECG] [EKG]: Secondary | ICD-10-CM | POA: Diagnosis not present

## 2023-03-07 DIAGNOSIS — L89153 Pressure ulcer of sacral region, stage 3: Secondary | ICD-10-CM | POA: Diagnosis not present

## 2023-03-07 DIAGNOSIS — Z85528 Personal history of other malignant neoplasm of kidney: Secondary | ICD-10-CM

## 2023-03-07 DIAGNOSIS — E66812 Obesity, class 2: Secondary | ICD-10-CM | POA: Diagnosis present

## 2023-03-07 DIAGNOSIS — Z7982 Long term (current) use of aspirin: Secondary | ICD-10-CM

## 2023-03-07 DIAGNOSIS — I350 Nonrheumatic aortic (valve) stenosis: Secondary | ICD-10-CM | POA: Diagnosis present

## 2023-03-07 DIAGNOSIS — I9589 Other hypotension: Principal | ICD-10-CM | POA: Diagnosis present

## 2023-03-07 DIAGNOSIS — F32A Depression, unspecified: Secondary | ICD-10-CM | POA: Diagnosis present

## 2023-03-07 DIAGNOSIS — D638 Anemia in other chronic diseases classified elsewhere: Secondary | ICD-10-CM | POA: Diagnosis present

## 2023-03-07 DIAGNOSIS — Z9884 Bariatric surgery status: Secondary | ICD-10-CM

## 2023-03-07 DIAGNOSIS — I5043 Acute on chronic combined systolic (congestive) and diastolic (congestive) heart failure: Secondary | ICD-10-CM | POA: Diagnosis present

## 2023-03-07 DIAGNOSIS — M316 Other giant cell arteritis: Secondary | ICD-10-CM | POA: Diagnosis present

## 2023-03-07 DIAGNOSIS — B9689 Other specified bacterial agents as the cause of diseases classified elsewhere: Secondary | ICD-10-CM | POA: Diagnosis present

## 2023-03-07 DIAGNOSIS — R6521 Severe sepsis with septic shock: Secondary | ICD-10-CM

## 2023-03-07 DIAGNOSIS — Z8719 Personal history of other diseases of the digestive system: Secondary | ICD-10-CM

## 2023-03-07 DIAGNOSIS — Z87440 Personal history of urinary (tract) infections: Secondary | ICD-10-CM

## 2023-03-07 DIAGNOSIS — I428 Other cardiomyopathies: Secondary | ICD-10-CM | POA: Diagnosis present

## 2023-03-07 DIAGNOSIS — E669 Obesity, unspecified: Secondary | ICD-10-CM | POA: Diagnosis not present

## 2023-03-07 DIAGNOSIS — I471 Supraventricular tachycardia, unspecified: Secondary | ICD-10-CM | POA: Diagnosis present

## 2023-03-07 DIAGNOSIS — E872 Acidosis, unspecified: Secondary | ICD-10-CM | POA: Diagnosis present

## 2023-03-07 DIAGNOSIS — Z7401 Bed confinement status: Secondary | ICD-10-CM

## 2023-03-07 DIAGNOSIS — Z86711 Personal history of pulmonary embolism: Secondary | ICD-10-CM | POA: Diagnosis not present

## 2023-03-07 DIAGNOSIS — F418 Other specified anxiety disorders: Secondary | ICD-10-CM | POA: Diagnosis not present

## 2023-03-07 DIAGNOSIS — L89312 Pressure ulcer of right buttock, stage 2: Secondary | ICD-10-CM | POA: Diagnosis present

## 2023-03-07 DIAGNOSIS — Z638 Other specified problems related to primary support group: Secondary | ICD-10-CM

## 2023-03-07 DIAGNOSIS — I081 Rheumatic disorders of both mitral and tricuspid valves: Secondary | ICD-10-CM | POA: Diagnosis present

## 2023-03-07 DIAGNOSIS — J432 Centrilobular emphysema: Secondary | ICD-10-CM | POA: Diagnosis not present

## 2023-03-07 DIAGNOSIS — Z9089 Acquired absence of other organs: Secondary | ICD-10-CM

## 2023-03-07 DIAGNOSIS — Z5982 Transportation insecurity: Secondary | ICD-10-CM

## 2023-03-07 DIAGNOSIS — D509 Iron deficiency anemia, unspecified: Secondary | ICD-10-CM | POA: Diagnosis present

## 2023-03-07 DIAGNOSIS — Z7952 Long term (current) use of systemic steroids: Secondary | ICD-10-CM

## 2023-03-07 DIAGNOSIS — R0902 Hypoxemia: Secondary | ICD-10-CM | POA: Diagnosis present

## 2023-03-07 DIAGNOSIS — A419 Sepsis, unspecified organism: Principal | ICD-10-CM | POA: Diagnosis present

## 2023-03-07 DIAGNOSIS — Z803 Family history of malignant neoplasm of breast: Secondary | ICD-10-CM

## 2023-03-07 DIAGNOSIS — Z6841 Body Mass Index (BMI) 40.0 and over, adult: Secondary | ICD-10-CM

## 2023-03-07 DIAGNOSIS — G894 Chronic pain syndrome: Secondary | ICD-10-CM | POA: Diagnosis not present

## 2023-03-07 DIAGNOSIS — I3139 Other pericardial effusion (noninflammatory): Secondary | ICD-10-CM | POA: Diagnosis present

## 2023-03-07 DIAGNOSIS — N390 Urinary tract infection, site not specified: Secondary | ICD-10-CM | POA: Diagnosis present

## 2023-03-07 DIAGNOSIS — Z91013 Allergy to seafood: Secondary | ICD-10-CM

## 2023-03-07 DIAGNOSIS — I5020 Unspecified systolic (congestive) heart failure: Secondary | ICD-10-CM | POA: Diagnosis present

## 2023-03-07 DIAGNOSIS — L8990 Pressure ulcer of unspecified site, unspecified stage: Secondary | ICD-10-CM | POA: Diagnosis present

## 2023-03-07 DIAGNOSIS — Z1152 Encounter for screening for COVID-19: Secondary | ICD-10-CM

## 2023-03-07 DIAGNOSIS — Z96653 Presence of artificial knee joint, bilateral: Secondary | ICD-10-CM | POA: Diagnosis present

## 2023-03-07 DIAGNOSIS — Z8249 Family history of ischemic heart disease and other diseases of the circulatory system: Secondary | ICD-10-CM

## 2023-03-07 DIAGNOSIS — Z933 Colostomy status: Secondary | ICD-10-CM

## 2023-03-07 DIAGNOSIS — Z9889 Other specified postprocedural states: Secondary | ICD-10-CM

## 2023-03-07 DIAGNOSIS — M48061 Spinal stenosis, lumbar region without neurogenic claudication: Secondary | ICD-10-CM | POA: Diagnosis present

## 2023-03-07 DIAGNOSIS — Z79899 Other long term (current) drug therapy: Secondary | ICD-10-CM

## 2023-03-07 DIAGNOSIS — R54 Age-related physical debility: Secondary | ICD-10-CM | POA: Diagnosis present

## 2023-03-07 DIAGNOSIS — J069 Acute upper respiratory infection, unspecified: Secondary | ICD-10-CM | POA: Diagnosis not present

## 2023-03-07 DIAGNOSIS — Z90710 Acquired absence of both cervix and uterus: Secondary | ICD-10-CM

## 2023-03-07 DIAGNOSIS — E854 Organ-limited amyloidosis: Secondary | ICD-10-CM | POA: Diagnosis present

## 2023-03-07 DIAGNOSIS — E2740 Unspecified adrenocortical insufficiency: Secondary | ICD-10-CM | POA: Diagnosis not present

## 2023-03-07 DIAGNOSIS — Z91041 Radiographic dye allergy status: Secondary | ICD-10-CM

## 2023-03-07 DIAGNOSIS — Z720 Tobacco use: Secondary | ICD-10-CM

## 2023-03-07 DIAGNOSIS — B9620 Unspecified Escherichia coli [E. coli] as the cause of diseases classified elsewhere: Secondary | ICD-10-CM | POA: Diagnosis not present

## 2023-03-07 DIAGNOSIS — Z7901 Long term (current) use of anticoagulants: Secondary | ICD-10-CM

## 2023-03-07 DIAGNOSIS — E876 Hypokalemia: Secondary | ICD-10-CM | POA: Diagnosis not present

## 2023-03-07 DIAGNOSIS — M797 Fibromyalgia: Secondary | ICD-10-CM | POA: Diagnosis present

## 2023-03-07 DIAGNOSIS — I43 Cardiomyopathy in diseases classified elsewhere: Secondary | ICD-10-CM | POA: Diagnosis present

## 2023-03-07 DIAGNOSIS — K219 Gastro-esophageal reflux disease without esophagitis: Secondary | ICD-10-CM | POA: Diagnosis present

## 2023-03-07 DIAGNOSIS — E871 Hypo-osmolality and hyponatremia: Secondary | ICD-10-CM | POA: Diagnosis not present

## 2023-03-07 DIAGNOSIS — Z833 Family history of diabetes mellitus: Secondary | ICD-10-CM

## 2023-03-07 DIAGNOSIS — Z91010 Allergy to peanuts: Secondary | ICD-10-CM

## 2023-03-07 DIAGNOSIS — I951 Orthostatic hypotension: Secondary | ICD-10-CM | POA: Diagnosis present

## 2023-03-07 LAB — URINALYSIS, W/ REFLEX TO CULTURE (INFECTION SUSPECTED)
Bilirubin Urine: NEGATIVE
Glucose, UA: NEGATIVE mg/dL
Hgb urine dipstick: NEGATIVE
Ketones, ur: NEGATIVE mg/dL
Nitrite: NEGATIVE
Protein, ur: NEGATIVE mg/dL
Specific Gravity, Urine: 1.008 (ref 1.005–1.030)
pH: 5 (ref 5.0–8.0)

## 2023-03-07 LAB — COMPREHENSIVE METABOLIC PANEL
ALT: 15 U/L (ref 0–44)
AST: 23 U/L (ref 15–41)
Albumin: <1.5 g/dL — ABNORMAL LOW (ref 3.5–5.0)
Alkaline Phosphatase: 168 U/L — ABNORMAL HIGH (ref 38–126)
Anion gap: 11 (ref 5–15)
BUN: 23 mg/dL (ref 8–23)
CO2: 27 mmol/L (ref 22–32)
Calcium: 7.2 mg/dL — ABNORMAL LOW (ref 8.9–10.3)
Chloride: 98 mmol/L (ref 98–111)
Creatinine, Ser: 0.84 mg/dL (ref 0.44–1.00)
GFR, Estimated: >60 mL/min (ref >60)
Glucose, Bld: 108 mg/dL — ABNORMAL HIGH (ref 70–99)
Potassium: 3.8 mmol/L (ref 3.5–5.1)
Sodium: 136 mmol/L (ref 135–145)
Total Bilirubin: 0.7 mg/dL (ref 0.0–1.2)
Total Protein: 5.1 g/dL — ABNORMAL LOW (ref 6.5–8.1)

## 2023-03-07 LAB — CBC WITH DIFFERENTIAL/PLATELET
Abs Immature Granulocytes: 0.22 10*3/uL — ABNORMAL HIGH (ref 0.00–0.07)
Basophils Absolute: 0 10*3/uL (ref 0.0–0.1)
Basophils Relative: 0 %
Eosinophils Absolute: 0.4 10*3/uL (ref 0.0–0.5)
Eosinophils Relative: 4 %
HCT: 26.5 % — ABNORMAL LOW (ref 36.0–46.0)
Hemoglobin: 8.6 g/dL — ABNORMAL LOW (ref 12.0–15.0)
Immature Granulocytes: 2 %
Lymphocytes Relative: 11 %
Lymphs Abs: 1.1 10*3/uL (ref 0.7–4.0)
MCH: 28.4 pg (ref 26.0–34.0)
MCHC: 32.5 g/dL (ref 30.0–36.0)
MCV: 87.5 fL (ref 80.0–100.0)
Monocytes Absolute: 0.3 10*3/uL (ref 0.1–1.0)
Monocytes Relative: 3 %
Neutro Abs: 8.2 10*3/uL — ABNORMAL HIGH (ref 1.7–7.7)
Neutrophils Relative %: 80 %
Platelets: 585 10*3/uL — ABNORMAL HIGH (ref 150–400)
RBC: 3.03 MIL/uL — ABNORMAL LOW (ref 3.87–5.11)
RDW: 16.5 % — ABNORMAL HIGH (ref 11.5–15.5)
WBC: 10.3 10*3/uL (ref 4.0–10.5)
nRBC: 0.2 % (ref 0.0–0.2)

## 2023-03-07 LAB — PROTIME-INR
INR: 1.9 — ABNORMAL HIGH (ref 0.8–1.2)
Prothrombin Time: 22.1 s — ABNORMAL HIGH (ref 11.4–15.2)

## 2023-03-07 LAB — RESP PANEL BY RT-PCR (RSV, FLU A&B, COVID)  RVPGX2
Influenza A by PCR: NEGATIVE
Influenza B by PCR: NEGATIVE
Resp Syncytial Virus by PCR: NEGATIVE
SARS Coronavirus 2 by RT PCR: NEGATIVE

## 2023-03-07 LAB — BRAIN NATRIURETIC PEPTIDE: B Natriuretic Peptide: 193.3 pg/mL — ABNORMAL HIGH (ref 0.0–100.0)

## 2023-03-07 LAB — TROPONIN I (HIGH SENSITIVITY)
Troponin I (High Sensitivity): 217 ng/L (ref <18)
Troponin I (High Sensitivity): 251 ng/L (ref <18)

## 2023-03-07 LAB — LACTIC ACID, PLASMA
Lactic Acid, Venous: 1.7 mmol/L (ref 0.5–1.9)
Lactic Acid, Venous: 2.2 mmol/L (ref 0.5–1.9)

## 2023-03-07 LAB — APTT: aPTT: 57 s — ABNORMAL HIGH (ref 24–36)

## 2023-03-07 MED ORDER — MIDODRINE HCL 5 MG PO TABS
10.0000 mg | ORAL_TABLET | Freq: Three times a day (TID) | ORAL | Status: DC
  Administered 2023-03-08 – 2023-03-09 (×6): 10 mg via ORAL
  Filled 2023-03-07 (×6): qty 2

## 2023-03-07 MED ORDER — ACETAMINOPHEN 325 MG PO TABS
650.0000 mg | ORAL_TABLET | Freq: Four times a day (QID) | ORAL | Status: DC | PRN
Start: 2023-03-07 — End: 2023-03-27
  Administered 2023-03-11 – 2023-03-17 (×7): 650 mg via ORAL
  Filled 2023-03-07 (×7): qty 2

## 2023-03-07 MED ORDER — OXYCODONE-ACETAMINOPHEN 10-325 MG PO TABS: 1.0000 | ORAL_TABLET | Freq: Four times a day (QID) | ORAL | Status: DC | PRN

## 2023-03-07 MED ORDER — HYDROCORTISONE SOD SUC (PF) 100 MG IJ SOLR
100.0000 mg | Freq: Three times a day (TID) | INTRAMUSCULAR | Status: DC
  Administered 2023-03-08 (×3): 100 mg via INTRAVENOUS
  Filled 2023-03-07 (×3): qty 2

## 2023-03-07 MED ORDER — SODIUM CHLORIDE 0.9 % IV SOLN: INTRAVENOUS | Status: AC

## 2023-03-07 MED ORDER — VANCOMYCIN HCL IN DEXTROSE 1-5 GM/200ML-% IV SOLN
1000.0000 mg | Freq: Once | INTRAVENOUS | Status: AC
  Administered 2023-03-07: 1000 mg via INTRAVENOUS
  Filled 2023-03-07: qty 200

## 2023-03-07 MED ORDER — ACETAMINOPHEN 325 MG PO TABS
325.0000 mg | ORAL_TABLET | Freq: Four times a day (QID) | ORAL | Status: DC | PRN
  Administered 2023-03-08 – 2023-03-27 (×7): 325 mg via ORAL
  Filled 2023-03-07 (×10): qty 1

## 2023-03-07 MED ORDER — ONDANSETRON HCL 4 MG/2ML IJ SOLN
4.0000 mg | Freq: Three times a day (TID) | INTRAMUSCULAR | Status: DC | PRN
  Filled 2023-03-07: qty 2

## 2023-03-07 MED ORDER — LACTATED RINGERS IV BOLUS (SEPSIS)
1000.0000 mL | Freq: Once | INTRAVENOUS | Status: AC
  Administered 2023-03-07: 1000 mL via INTRAVENOUS

## 2023-03-07 MED ORDER — ALBUTEROL SULFATE (2.5 MG/3ML) 0.083% IN NEBU
2.5000 mg | INHALATION_SOLUTION | RESPIRATORY_TRACT | Status: DC | PRN
  Administered 2023-03-16 – 2023-03-24 (×4): 2.5 mg via RESPIRATORY_TRACT
  Filled 2023-03-07 (×4): qty 3

## 2023-03-07 MED ORDER — SODIUM CHLORIDE 0.9 % IV SOLN
2.0000 g | Freq: Once | INTRAVENOUS | Status: AC
  Administered 2023-03-07: 2 g via INTRAVENOUS
  Filled 2023-03-07: qty 12.5

## 2023-03-07 MED ORDER — DM-GUAIFENESIN ER 30-600 MG PO TB12
1.0000 | ORAL_TABLET | Freq: Two times a day (BID) | ORAL | Status: DC | PRN
  Administered 2023-03-17 – 2023-03-20 (×5): 1 via ORAL
  Filled 2023-03-07 (×6): qty 1

## 2023-03-07 MED ORDER — OXYCODONE HCL 5 MG PO TABS
10.0000 mg | ORAL_TABLET | Freq: Four times a day (QID) | ORAL | Status: DC | PRN
  Administered 2023-03-08 – 2023-03-17 (×8): 10 mg via ORAL
  Administered 2023-03-17: 5 mg via ORAL
  Administered 2023-03-18 – 2023-03-27 (×9): 10 mg via ORAL
  Filled 2023-03-07 (×20): qty 2

## 2023-03-07 MED ORDER — ALBUMIN HUMAN 25 % IV SOLN
25.0000 g | Freq: Once | INTRAVENOUS | Status: AC
  Administered 2023-03-08: 25 g via INTRAVENOUS
  Filled 2023-03-07: qty 100

## 2023-03-07 NOTE — ED Triage Notes (Signed)
  To ER via EMS from [facility] for report of fever of 100.1 and hypotensive at 630p today. Received tylenol at that time. EMS reports pressures in the 80s, heart rate sustained in the 120s, tachypneic. History of UTIs. Aox4, hot to touch.

## 2023-03-07 NOTE — Progress Notes (Signed)
 CODE SEPSIS - PHARMACY COMMUNICATION  **Broad Spectrum Antibiotics should be administered within 1 hour of Sepsis diagnosis**  Time Code Sepsis Called/Page Received: 2032  Antibiotics Ordered: cefepime, vanc  Time of 1st antibiotic administration: 2042  Additional action taken by pharmacy: N/A  If necessary, Name of Provider/Nurse Contacted: N/A    Merryl Hacker ,PharmD Clinical Pharmacist  03/07/2023  8:48 PM

## 2023-03-07 NOTE — H&P (Incomplete)
 History and Physical    Wendy Sanchez HKV:[id number] DOB: Jul 29, 1950 DOA: 03/07/2023  Referring MD/NP/PA:   PCP: Pcp, No   Patient coming from:  The patient is coming from home.     Chief Complaint:   HPI: Wendy Sanchez is a 73 y.o. female with medical history significant of      Data reviewed independently and ED Course: pt was found to have     ***       EKG: I have personally reviewed.  Not done in ED, will get one.   ***   Review of Systems:   General: no fevers, chills, no body weight gain, has poor appetite, has fatigue HEENT: no blurry vision, hearing changes or sore throat Respiratory: no dyspnea, coughing, wheezing CV: no chest pain, no palpitations GI: no nausea, vomiting, abdominal pain, diarrhea, constipation GU: no dysuria, burning on urination, increased urinary frequency, hematuria  Ext: no leg edema Neuro: no unilateral weakness, numbness, or tingling, no vision change or hearing loss Skin: no rash, no skin tear. MSK: No muscle spasm, no deformity, no limitation of range of movement in spin Heme: No easy bruising.  Travel history: No recent long distant travel.   Allergy:  Allergies  Allergen Reactions   Hydrocodone-Acetaminophen Swelling    hands   Iodine Anaphylaxis and Swelling    Iv dye 02/03/21-Ispoke to patient and she had IV contrast  X4 last year and has had no problem. ( Last CT abdomen with contrast was on 01/02/21.-    Other Other (See Comments)    ALLERGY TO METAL - BLACKENS SKIN AND CAUSES A RASH    Tape Swelling and Other (See Comments)    Skin comes off.  Paper tape is ok tegaderm OK   Peanut Oil Other (See Comments)    ** Nuts cause runny nose   Shellfish Allergy Nausea And Vomiting and Swelling    Episode of GI infection after eating clam chowder. Still eats shrimp and other seafood    Past Medical History:  Diagnosis Date   Allergic rhinitis    Arthritis    Asthma    problems with fumes and aerosols cause asthma    Chest pain 10/19/2016   CHF (congestive heart failure) (HCC)    Complication of anesthesia    awakens during surgery; has occurred with last 3-4 surgeries    COPD (chronic obstructive pulmonary disease) (HCC)    DVT (deep venous thrombosis) (HCC) 07/13/2018   Right leg   Environmental allergies    fumes    Fibromyalgia    GERD (gastroesophageal reflux disease)    Headache    History of bronchitis    Hx of total knee arthroplasty 12/13/2015   Hyperlipidemia    Hypertension    Morbid obesity with BMI of 45.0-49.9, adult (HCC) 07/25/2015   NICM (nonischemic cardiomyopathy) (HCC)    a. ? PVC mediated;  b. 06/2016 Echo: EF 25-30%, mild LVH, mild LAE, mild AI/MR/TR/PR; c. 08/2016 Cath: nl cors, EF 25%.   Numbness    hands bilat when driving; improves when not preforming task    Osteoarthritis of left knee 08/02/2015   PE (pulmonary thromboembolism) (HCC) 07/13/2018   Pes anserinus bursitis of left knee 05/18/2016   Presence of right artificial knee joint 05/18/2016   PVC (premature ventricular contraction) 06/04/2017   PVC's (premature ventricular contractions)    a. 11/2016 Amio started;  b. 11/29/16 48h Holter: 57574 PVC's (41%); c. 12/2016 24h Holter: 18040 PVC's (15%); c.  02/2017 48h Holter: 37262 PVC's (41%).   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   Temporal arteritis (HCC) 05/01/2020   Tinnitus    comes and goes    Unilateral primary osteoarthritis, right knee 12/13/2015   Vertigo    none for over 2 yrs    Past Surgical History:  Procedure Laterality Date   ABDOMINAL HYSTERECTOMY  1979   left ovary remains   ABDOMINAL HYSTERECTOMY     ARTERY BIOPSY Left 06/07/2019   Procedure: BIOPSY TEMPORAL ARTERY;  Surgeon: Annice Needy, MD;  Location: [facility];  Service: Vascular;  Laterality: Left;   CHOLECYSTECTOMY     COLON SURGERY     COLONOSCOPY      COLONOSCOPY N/A 10/30/2019   Procedure: COLONOSCOPY;  Surgeon: Regis Bill, MD;  Location: [facility];  Service: Endoscopy;  Laterality: N/A;   ESOPHAGOGASTRODUODENOSCOPY N/A 10/30/2019   Procedure: ESOPHAGOGASTRODUODENOSCOPY (EGD);  Surgeon: Regis Bill, MD;  Location: [facility];  Service: Endoscopy;  Laterality: N/A;   fibrous tissue removed from right shoulder and back of neck      2 years ago    FLOOR OF MOUTH BIOPSY N/A 02/28/2019   Procedure: SALIVARY GLAND BIOPSY;  Surgeon: Bud Face, MD;  Location: [facility];  Service: ENT;  Laterality: N/A;   FRACTURE SURGERY     left small finger   GANGLION CYST EXCISION     GASTRIC BYPASS  1980   states had bypass with banding and band left in -   GASTRIC BYPASS OPEN     JOINT REPLACEMENT Bilateral 2017   knees   KNEE SURGERY Bilateral    both knees  2001   LAPAROSCOPIC SIGMOID COLECTOMY Left 09/13/2020   at [facility] for a perforated sigmoid diverticulitis   NECK SURGERY N/A 1205/19   ganglion cyst removal, benigh    PARTIAL COLECTOMY  09/2020   PULMONARY THROMBECTOMY Bilateral 07/14/2018   Procedure: PULMONARY THROMBECTOMY;  Surgeon: Annice Needy, MD;  Location: [facility];  Service: Cardiovascular;  Laterality: Bilateral;   PVC ABLATION N/A 06/04/2017   Procedure: PVC ABLATION;  Surgeon: Marinus Maw, MD;  Location: [facility];  Service: Cardiovascular;  Laterality: N/A;   RIGHT/LEFT HEART CATH AND CORONARY ANGIOGRAPHY Bilateral 08/12/2016   Procedure: Right/Left Heart Cath and Coronary Angiography;  Surgeon: Alwyn Pea, MD;  Location: [facility];  Service: Cardiovascular;  Laterality: Bilateral;   TONSILLECTOMY     age 73   TOTAL KNEE ARTHROPLASTY Left 08/02/2015   Procedure: LEFT TOTAL KNEE ARTHROPLASTY;  Surgeon: Kathryne Hitch, MD;  Location: [facility];  Service: Orthopedics;  Laterality: Left;   TOTAL KNEE ARTHROPLASTY Right 12/13/2015   Procedure: RIGHT TOTAL  KNEE ARTHROPLASTY;  Surgeon: Kathryne Hitch, MD;  Location: [facility];  Service: Orthopedics;  Laterality: Right;    Social History:  reports that she quit smoking about 2 years ago. Her smoking use included cigarettes. She started smoking about 52 years ago. She has a 12.5 pack-year smoking history. Her smokeless tobacco use includes chew. She reports that she does not drink alcohol and does not use drugs.  Family History:  Family History  Problem Relation Age of Onset   Hypertension Father    Deep vein thrombosis Father    Dementia Mother    Diabetes Sister  Congestive Heart Failure Sister    Congestive Heart Failure Brother    Congestive Heart Failure Daughter    Congestive Heart Failure Son    Breast cancer Maternal Aunt        great aunt     Prior to Admission medications   Medication Sig Start Date End Date Taking? Authorizing Provider  acetaminophen (TYLENOL) 325 MG tablet Take 2 tablets (650 mg total) by mouth every 6 (six) hours as needed for mild pain or fever. Patient taking differently: Take 650 mg by mouth every 8 (eight) hours as needed for mild pain (pain score 1-3) or fever. 11/17/20   Myrtie Neither, MD  albuterol (VENTOLIN HFA) 108 (90 Base) MCG/ACT inhaler Inhale 2 puffs into the lungs every 6 (six) hours as needed for wheezing.    [provider]  alendronate (FOSAMAX) 35 MG tablet Take 1 tablet (35 mg total) by mouth every 7 (seven) days Take with a full glass of water. Do not lie down for the next 30 min. 07/09/22 07/09/23  [provider]  apixaban (ELIQUIS) 5 MG TABS tablet Take 1 tablet (5 mg total) by mouth 2 (two) times daily. 07/22/18 01/30/23  Ihor Austin, MD  ascorbic acid (VITAMIN C) 500 MG tablet Take 500 mg by mouth daily.    [provider]  calcium carbonate (OS-CAL - DOSED IN MG OF ELEMENTAL CALCIUM) 1250 (500 Ca) MG tablet Take 2 tablets by mouth in the morning.    [provider]  Carboxymethylcellulose Sod  PF 1 % GEL Administer 2 drops to both eyes Three (3) times a day. 10/20/22   [provider]  Cholecalciferol (VITAMIN D-1000 MAX ST) 25 MCG (1000 UT) tablet Take by mouth. 10/20/22 10/20/23  [provider]  copper tablet Take 1 tablet (2 mg total) by mouth daily. 02/05/23   Arnetha Courser, MD  cyanocobalamin (VITAMIN B12) 1000 MCG tablet Take 1 tablet by mouth daily. 10/20/22   [provider]  diphenhydrAMINE (BENADRYL) 25 mg capsule Take 25 mg by mouth every 6 (six) hours as needed for itching or allergies.    [provider]  docusate sodium (COLACE) 100 MG capsule Take 100 mg by mouth 2 (two) times daily as needed for mild constipation.    [provider]  estradiol (ESTRACE) 0.1 MG/GM vaginal cream Insert 2 g into the vagina daily. For the first two weeks, apply every day. After then, apply 2-3 times a week indefinitely 06/26/22 06/26/23  [provider]  ferrous sulfate 325 (65 FE) MG tablet Take 325 mg by mouth 2 (two) times daily with a meal.    [provider]  fluticasone (FLONASE) 50 MCG/ACT nasal spray Place 1 spray into both nostrils daily.    [provider]  folic acid (FOLVITE) 1 MG tablet Take 1 mg by mouth daily. 01/18/23   [provider]  gabapentin (NEURONTIN) 300 MG capsule Take 1 capsule (300 mg total) by mouth 3 (three) times daily. 03/03/23 07/01/23  Yevette Edwards, MD  hydrocortisone (CORTEF) 5 MG tablet Take 3 tablets (15 mg total) by mouth every morning AND 1 tablet (5 mg total) every afternoon after lunch and before dinner 10/20/22   [provider]  ipratropium-albuterol (DUONEB) 0.5-2.5 (3) MG/3ML SOLN Take 3 mLs by nebulization every 6 (six) hours as needed (shortness of breath). Patient not taking: Reported on 01/30/2023    [provider]  lactose free nutrition (BOOST PLUS) LIQD Take 237 mLs by mouth 3 (  three) times daily between meals. 02/04/23   Arnetha Courser, MD  levocetirizine  (XYZAL) 5 MG tablet Take 10 mg by mouth every evening.    [provider]  lidocaine 4 %     [provider]  magic mouthwash w/lidocaine SOLN Take 15 mLs by mouth 3 (three) times daily as needed for mouth pain. Suspension contains equal amounts of Maalox Extra Strength, nystatin, diphenhydramine and lidocaine. 02/04/23   Arnetha Courser, MD  melatonin 3 MG TABS tablet Take 3 mg by mouth at bedtime as needed. 10/20/22   [provider]  metoprolol tartrate (LOPRESSOR) 25 MG tablet Take 1 tablet (25 mg total) by mouth 2 (two) times daily. 02/04/23   Arnetha Courser, MD  midodrine (PROAMATINE) 10 MG tablet Take 5 mg by mouth 3 (three) times daily.    [provider]  montelukast (SINGULAIR) 10 MG tablet Take 10 mg by mouth daily. 01/27/23   [provider]  mupirocin ointment (BACTROBAN) 2 % Apply 1 Application topically 3 (three) times daily as needed. 01/18/23   [provider]  nortriptyline (PAMELOR) 50 MG capsule Take 50 mg by mouth at bedtime.    [provider]  nystatin cream (MYCOSTATIN) Apply 1 application  topically 4 (four) times daily. (Apply under skin folds)    [provider]  omeprazole (PRILOSEC OTC) 20 MG tablet Take 20 mg by mouth daily.    [provider]  ondansetron (ZOFRAN) 4 MG tablet Take 1 tablet (4 mg total) by mouth every 6 (six) hours as needed for nausea. Patient not taking: Reported on 03/17/2022 02/05/21   Marguerita Merles Latif, DO  oxyCODONE-acetaminophen (PERCOCET) 10-325 MG tablet Take 1 tablet by mouth every 6 (six) hours as needed for pain. 03/03/23 04/02/23  Yevette Edwards, MD  oxyCODONE-acetaminophen (PERCOCET) 10-325 MG tablet Take 1 tablet by mouth every 6 (six) hours as needed for pain. 04/02/23 05/02/23  Yevette Edwards, MD  polyethylene glycol (MIRALAX / GLYCOLAX) 17 g packet Take 17 g by mouth daily as needed for moderate constipation. 11/17/20   Myrtie Neither, MD  potassium chloride SA  (KLOR-CON M) 20 MEQ tablet Take 20 mEq by mouth 2 (two) times daily.    [provider]  thiamine (VITAMIN B1) 100 MG tablet Take 1 tablet by mouth daily. 10/20/22   [provider]  torsemide (DEMADEX) 20 MG tablet Take 1 tablet (20 mg total) by mouth daily. 02/05/23   Arnetha Courser, MD  triamcinolone cream (KENALOG) 0.1 % Apply topically daily as needed.    [provider]  vitamin A 3 MG (10000 UNITS) capsule Take 1 capsule (10,000 Units total) by mouth daily. For 60 days 02/05/23   Arnetha Courser, MD  zinc sulfate, 50mg  elemental zinc, 220 (50 Zn) MG capsule Take 1 capsule (220 mg total) by mouth daily. For 14 days 02/05/23   Arnetha Courser, MD    Physical Exam: Vitals:   03/07/23 2030 03/07/23 2130 03/07/23 2132 03/07/23 2208  BP:  (!) 86/59 (!) 86/59   Pulse:  (!) 108 (!) 109   Resp:  16 16   Temp:    99.5 F (37.5 C)  TempSrc:    Oral  SpO2:  100% 97%   Weight: 117.3 kg     Height: 5\' 8"  (1.727 m)      General: Not in acute distress HEENT:       Eyes: PERRL, EOMI, no jaundice       ENT: No discharge  from the ears and nose, no pharynx injection, no tonsillar enlargement.        Neck: No JVD, no bruit, no mass felt. Heme: No neck lymph node enlargement. Cardiac: S1/S2, RRR, No murmurs, No gallops or rubs. Respiratory: No rales, wheezing, rhonchi or rubs. GI: Soft, nondistended, nontender, no rebound pain, no organomegaly, BS present. GU: No hematuria Ext: No pitting leg edema bilaterally. 1+DP/PT pulse bilaterally. Musculoskeletal: No joint deformities, No joint redness or warmth, no limitation of ROM in spin. Skin: No rashes.  Neuro: Alert, oriented X3, cranial nerves II-XII grossly intact, moves all extremities normally. Muscle strength 5/5 in all extremities, sensation to light touch intact. Brachial reflex 2+ bilaterally. Knee reflex 1+ bilaterally. Negative Babinski's sign. Normal finger to nose test. Psych: Patient is not psychotic, no suicidal or  hemocidal ideation.  Labs on Admission: I have personally reviewed following labs and imaging studies  CBC: Recent Labs  Lab 03/07/23 2036  WBC 10.3  NEUTROABS 8.2*  HGB 8.6*  HCT 26.5*  MCV 87.5  PLT 585*   Basic Metabolic Panel: Recent Labs  Lab 03/07/23 2036  NA 136  K 3.8  CL 98  CO2 27  GLUCOSE 108*  BUN 23  CREATININE 0.84  CALCIUM 7.2*   GFR: Estimated Creatinine Clearance: 82.7 mL/min (by C-G formula based on SCr of 0.84 mg/dL). Liver Function Tests: Recent Labs  Lab 03/07/23 2036  AST 23  ALT 15  ALKPHOS 168*  BILITOT 0.7  PROT 5.1*  ALBUMIN <1.5*   No results for input(s): "LIPASE", "AMYLASE" in the last 168 hours. No results for input(s): "AMMONIA" in the last 168 hours. Coagulation Profile: Recent Labs  Lab 03/07/23 2036  INR 1.9*   Cardiac Enzymes: No results for input(s): "CKTOTAL", "CKMB", "CKMBINDEX", "TROPONINI" in the last 168 hours. BNP (last 3 results) No results for input(s): "PROBNP" in the last 8760 hours. HbA1C: No results for input(s): "HGBA1C" in the last 72 hours. CBG: No results for input(s): "GLUCAP" in the last 168 hours. Lipid Profile: No results for input(s): "CHOL", "HDL", "LDLCALC", "TRIG", "CHOLHDL", "LDLDIRECT" in the last 72 hours. Thyroid Function Tests: No results for input(s): "TSH", "T4TOTAL", "FREET4", "T3FREE", "THYROIDAB" in the last 72 hours. Anemia Panel: No results for input(s): "VITAMINB12", "FOLATE", "FERRITIN", "TIBC", "IRON", "RETICCTPCT" in the last 72 hours. Urine analysis:    Component Value Date/Time   COLORURINE YELLOW (A) 03/07/2023 2126   APPEARANCEUR HAZY (A) 03/07/2023 2126   LABSPEC 1.008 03/07/2023 2126   PHURINE 5.0 03/07/2023 2126   GLUCOSEU NEGATIVE 03/07/2023 2126   HGBUR NEGATIVE 03/07/2023 2126   BILIRUBINUR NEGATIVE 03/07/2023 2126   KETONESUR NEGATIVE 03/07/2023 2126   PROTEINUR NEGATIVE 03/07/2023 2126   NITRITE NEGATIVE 03/07/2023 2126   LEUKOCYTESUR SMALL (A) 03/07/2023  2126   Sepsis Labs: @LABRCNTIP (procalcitonin:4,lacticidven:4) ) Recent Results (from the past 240 hours)  Resp panel by RT-PCR (RSV, Flu A&B, Covid) Anterior Nasal Swab     Status: None   Collection Time: 03/07/23  8:35 PM   Specimen: Anterior Nasal Swab  Result Value Ref Range Status   SARS Coronavirus 2 by RT PCR NEGATIVE NEGATIVE Final    Comment: (NOTE) SARS-CoV-2 target nucleic acids are NOT DETECTED.  The SARS-CoV-2 RNA is generally detectable in upper respiratory specimens during the acute phase of infection. The lowest concentration of SARS-CoV-2 viral copies this assay can detect is 138 copies/mL. A negative result does not preclude SARS-Cov-2 infection and should not be used as the sole basis for treatment or  other patient management decisions. A negative result may occur with  improper specimen collection/handling, submission of specimen other than nasopharyngeal swab, presence of viral mutation(s) within the areas targeted by this assay, and inadequate number of viral copies(<138 copies/mL). A negative result must be combined with clinical observations, patient history, and epidemiological information. The expected result is Negative.  Fact Sheet for Patients:  BloggerCourse.com  Fact Sheet for Healthcare Providers:  SeriousBroker.it  This test is no t yet approved or cleared by the [place] FDA and  has been authorized for detection and/or diagnosis of SARS-CoV-2 by FDA under an Emergency Use Authorization (EUA). This EUA will remain  in effect (meaning this test can be used) for the duration of the COVID-19 declaration under Section 564(b)(1) of the Act, 21 U.S.C.section 360bbb-3(b)(1), unless the authorization is terminated  or revoked sooner.       Influenza A by PCR NEGATIVE NEGATIVE Final   Influenza B by PCR NEGATIVE NEGATIVE Final    Comment: (NOTE) The Xpert Xpress SARS-CoV-2/FLU/RSV plus assay is  intended as an aid in the diagnosis of influenza from Nasopharyngeal swab specimens and should not be used as a sole basis for treatment. Nasal washings and aspirates are unacceptable for Xpert Xpress SARS-CoV-2/FLU/RSV testing.  Fact Sheet for Patients: BloggerCourse.com  Fact Sheet for Healthcare Providers: SeriousBroker.it  This test is not yet approved or cleared by the [place] FDA and has been authorized for detection and/or diagnosis of SARS-CoV-2 by FDA under an Emergency Use Authorization (EUA). This EUA will remain in effect (meaning this test can be used) for the duration of the COVID-19 declaration under Section 564(b)(1) of the Act, 21 U.S.C. section 360bbb-3(b)(1), unless the authorization is terminated or revoked.     Resp Syncytial Virus by PCR NEGATIVE NEGATIVE Final    Comment: (NOTE) Fact Sheet for Patients: BloggerCourse.com  Fact Sheet for Healthcare Providers: SeriousBroker.it  This test is not yet approved or cleared by the [place] FDA and has been authorized for detection and/or diagnosis of SARS-CoV-2 by FDA under an Emergency Use Authorization (EUA). This EUA will remain in effect (meaning this test can be used) for the duration of the COVID-19 declaration under Section 564(b)(1) of the Act, 21 U.S.C. section 360bbb-3(b)(1), unless the authorization is terminated or revoked.  Performed at [facility], [address], [place], [place] [address]      Radiological Exams on Admission:   Assessment/Plan Active Problems:   * No active hospital problems. *   Assessment and Plan: No notes have been filed under this hospital service. Service: Hospitalist      Active Problems:   * No active hospital problems. *    DVT ppx: SQ Heparin         SQ Lovenox  Code Status: Full code   ***  Family Communication:     not  done, no family member is at bed side.              Yes, patient's    at bed side.       by phone   ***  Disposition Plan:  Anticipate discharge back to previous environment  Consults called:    Admission status and Level of care: :    for obs as inpt        Dispo: The patient is from: {From:23814}              Anticipated d/c is to: {To:23815}  Anticipated d/c date is: {Days:23816}              Patient currently {Medically stable:23817}    Severity of Illness:  {Observation/Inpatient:21159}       Date of Service 03/07/2023    Lorretta Harp [facility]   If 7PM-7AM, please contact night-coverage [email or web address] 03/07/2023, 11:13 PM

## 2023-03-07 NOTE — ED Provider Notes (Signed)
 [facility] Provider Note    Event Date/Time   First MD Initiated Contact with Patient 03/07/23 2028     (approximate)   History   Fever   HPI  Wendy Sanchez is a 73 y.o. female with a history of HFrEF, PE on Eliquis, perforated diverticulitis status post colectomy, COPD, adrenal insufficiency, recurrent UTIs who presents with fever noted at her facility this evening, acute onset, associated with low blood pressure.  The patient herself states that she feels lousy.  She reports feeling generally weak and has a headache.  She denies other acute pain.  Per EMS, she has had multiple UTIs in the past.  I reviewed the past medical records.  The patient was most recently admitted to the hospital service in January with fever and concern for sepsis although sources of infection were ruled out.   Physical Exam   Triage Vital Signs: ED Triage Vitals [03/07/23 2029]  Encounter Vitals Group     BP (!) 91/52     Systolic BP Percentile      Diastolic BP Percentile      Pulse Rate (!) 120     Resp (!) 24     Temp 99.7 F (37.6 C)     Temp Source Oral     SpO2 99 %     Weight      Height      Head Circumference      Peak Flow      Pain Score      Pain Loc      Pain Education      Exclude from Growth Chart     Most recent vital signs: Vitals:   03/07/23 2300 03/07/23 2330  BP: (!) 86/50 101/70  Pulse: 60 (!) 126  Resp: 17 (!) 22  Temp:    SpO2: 99% 98%     General: Alert and oriented, weak appearing, no distress.  CV:  Good peripheral perfusion.  Resp:  Normal effort.  Lungs CTAB. Abd:  Soft and nontender.  No distention.  Other:  Dry mucous membranes.  Normal speech.  Motor intact in all extremities.  Neck supple, full ROM.   ED Results / Procedures / Treatments   Labs (all labs ordered are listed, but only abnormal results are displayed) Labs Reviewed  LACTIC ACID, PLASMA - Abnormal; Notable for the following components:      Result  Value   Lactic Acid, Venous 2.2 (*)    All other components within normal limits  COMPREHENSIVE METABOLIC PANEL - Abnormal; Notable for the following components:   Glucose, Bld 108 (*)    Calcium 7.2 (*)    Total Protein 5.1 (*)    Albumin <1.5 (*)    Alkaline Phosphatase 168 (*)    All other components within normal limits  CBC WITH DIFFERENTIAL/PLATELET - Abnormal; Notable for the following components:   RBC 3.03 (*)    Hemoglobin 8.6 (*)    HCT 26.5 (*)    RDW 16.5 (*)    Platelets 585 (*)    Neutro Abs 8.2 (*)    Abs Immature Granulocytes 0.22 (*)    All other components within normal limits  PROTIME-INR - Abnormal; Notable for the following components:   Prothrombin Time 22.1 (*)    INR 1.9 (*)    All other components within normal limits  APTT - Abnormal; Notable for the following components:   aPTT 57 (*)    All other components  within normal limits  BRAIN NATRIURETIC PEPTIDE - Abnormal; Notable for the following components:   B Natriuretic Peptide 193.3 (*)    All other components within normal limits  URINALYSIS, W/ REFLEX TO CULTURE (INFECTION SUSPECTED) - Abnormal; Notable for the following components:   Color, Urine YELLOW (*)    APPearance HAZY (*)    Leukocytes,Ua SMALL (*)    Bacteria, UA RARE (*)    All other components within normal limits  TROPONIN I (HIGH SENSITIVITY) - Abnormal; Notable for the following components:   Troponin I (High Sensitivity) 217 (*)    All other components within normal limits  TROPONIN I (HIGH SENSITIVITY) - Abnormal; Notable for the following components:   Troponin I (High Sensitivity) 251 (*)    All other components within normal limits  RESP PANEL BY RT-PCR (RSV, FLU A&B, COVID)  [id number]  CULTURE, BLOOD (ROUTINE X 2)  CULTURE, BLOOD (ROUTINE X 2)  URINE CULTURE  LACTIC ACID, PLASMA  HEMOGLOBIN A1C  LIPID PANEL  TROPONIN I (HIGH SENSITIVITY)     EKG  ED ECG REPORT I, Dionne Bucy, the attending physician,  personally viewed and interpreted this ECG.  Date: 03/07/2023 EKG Time: 2111 Rate: 111 Rhythm: Sinus tachycardia QRS Axis: Borderline left axis Intervals: normal ST/T Wave abnormalities: Nonspecific T wave abnormalities Narrative Interpretation: no evidence of acute ischemia    RADIOLOGY  Chest x-ray: I independently viewed and interpreted the images; there is no focal consolidation or edema    PROCEDURES:  Critical Care performed: Yes, see critical care procedure note(s)  .Critical Care  Performed by: Dionne Bucy, MD Authorized by: Dionne Bucy, MD   Critical care provider statement:    Critical care time (minutes):  40   Critical care time was exclusive of:  Separately billable procedures and treating other patients   Critical care was necessary to treat or prevent imminent or life-threatening deterioration of the following conditions:  Sepsis   Critical care was time spent personally by me on the following activities:  Development of treatment plan with patient or surrogate, discussions with consultants, evaluation of patient's response to treatment, examination of patient, ordering and review of laboratory studies, ordering and review of radiographic studies, ordering and performing treatments and interventions, pulse oximetry, re-evaluation of patient's condition, review of old charts and obtaining history from patient or surrogate   Care discussed with: admitting provider      MEDICATIONS ORDERED IN ED: Medications  midodrine (PROAMATINE) tablet 10 mg (has no administration in time range)  hydrocortisone sodium succinate (SOLU-CORTEF) 100 MG injection 100 mg (has no administration in time range)  albumin human 25 % solution 25 g (has no administration in time range)  0.9 %  sodium chloride infusion (has no administration in time range)  albuterol (PROVENTIL) (2.5 MG/3ML) 0.083% nebulizer solution 2.5 mg (has no administration in time range)   dextromethorphan-guaiFENesin (MUCINEX DM) 30-600 MG per 12 hr tablet 1 tablet (has no administration in time range)  ondansetron (ZOFRAN) injection 4 mg (has no administration in time range)  acetaminophen (TYLENOL) tablet 650 mg (has no administration in time range)  oxyCODONE (Oxy IR/ROXICODONE) immediate release tablet 10 mg (has no administration in time range)    And  acetaminophen (TYLENOL) tablet 325 mg (has no administration in time range)  lactated ringers bolus 1,000 mL (0 mLs Intravenous Stopped 03/07/23 2207)    And  lactated ringers bolus 1,000 mL (0 mLs Intravenous Stopped 03/07/23 2207)  ceFEPIme (MAXIPIME) 2 g in sodium chloride  0.9 % 100 mL IVPB (0 g Intravenous Stopped 03/07/23 2105)  vancomycin (VANCOCIN) IVPB 1000 mg/200 mL premix (0 mg Intravenous Stopped 03/07/23 2207)     IMPRESSION / MDM / ASSESSMENT AND PLAN / ED COURSE  I reviewed the triage vital signs and the nursing notes.  73 year old female with PMH as noted above presents with fever and hypotension.  She reports feeling generalized weakness and has a headache.  She denies other focal symptoms.  On exam she is somewhat hypotensive and tachycardic with a borderline elevated temperature.  She is oriented x 4 and neurologic exam is nonfocal.  Exam is otherwise unremarkable for acute findings.  Differential diagnosis includes, but is not limited to, acute infection/sepsis, possible UTI, pneumonia, influenza, other viral syndrome.  I have a low suspicion for meningitis; although the patient does report a headache she has no meningeal signs and I suspect that the headache is likely secondary to the fever.  We will obtain lab workup, give fluids and antibiotics per the sepsis protocol, and reassess.  Patient's presentation is most consistent with acute presentation with potential threat to life or bodily function.  The patient is on the cardiac monitor to evaluate for evidence of arrhythmia and/or significant heart rate  changes.  ----------------------------------------- 11:39 PM on 03/07/2023 -----------------------------------------  Urinalysis shows some findings consistent with UTI.  Lab workup is significant for slightly elevated repeat lactate.  Troponin is also elevated, consistent with demand ischemia.  WBC count is not elevated.  CBC also shows anemia which appears chronic.  The patient's blood pressure initially improved significantly although now the MAP is still in the high 60s.  The patient is not requiring pressors at this time.  I consulted Dr. Clyde Lundborg from the hospitalist service; based on our discussion he agrees to evaluate the patient for admission.   FINAL CLINICAL IMPRESSION(S) / ED DIAGNOSES   Final diagnoses:  Sepsis, due to unspecified organism, unspecified whether acute organ dysfunction present [facility])     Rx / DC Orders   ED Discharge Orders     None        Note:  This document was prepared using Dragon voice recognition software and may include unintentional dictation errors.    Dionne Bucy, MD 03/07/23 [phone]

## 2023-03-07 NOTE — Sepsis Progress Note (Signed)
 Elink following for sepsis protocol.

## 2023-03-08 ENCOUNTER — Encounter: Payer: Self-pay | Admitting: Internal Medicine

## 2023-03-08 DIAGNOSIS — L8990 Pressure ulcer of unspecified site, unspecified stage: Secondary | ICD-10-CM | POA: Diagnosis present

## 2023-03-08 DIAGNOSIS — I5A Non-ischemic myocardial injury (non-traumatic): Secondary | ICD-10-CM | POA: Diagnosis not present

## 2023-03-08 DIAGNOSIS — A419 Sepsis, unspecified organism: Secondary | ICD-10-CM | POA: Diagnosis not present

## 2023-03-08 DIAGNOSIS — N390 Urinary tract infection, site not specified: Secondary | ICD-10-CM | POA: Diagnosis not present

## 2023-03-08 DIAGNOSIS — E2749 Other adrenocortical insufficiency: Secondary | ICD-10-CM | POA: Diagnosis not present

## 2023-03-08 LAB — BASIC METABOLIC PANEL
Anion gap: 13 (ref 5–15)
BUN: 20 mg/dL (ref 8–23)
CO2: 24 mmol/L (ref 22–32)
Calcium: 7.4 mg/dL — ABNORMAL LOW (ref 8.9–10.3)
Chloride: 98 mmol/L (ref 98–111)
Creatinine, Ser: 0.73 mg/dL (ref 0.44–1.00)
GFR, Estimated: >60 mL/min (ref >60)
Glucose, Bld: 132 mg/dL — ABNORMAL HIGH (ref 70–99)
Potassium: 3.9 mmol/L (ref 3.5–5.1)
Sodium: 135 mmol/L (ref 135–145)

## 2023-03-08 LAB — TROPONIN I (HIGH SENSITIVITY)
Troponin I (High Sensitivity): 113 ng/L (ref <18)
Troponin I (High Sensitivity): 150 ng/L (ref <18)
Troponin I (High Sensitivity): 241 ng/L (ref <18)
Troponin I (High Sensitivity): 61 ng/L — ABNORMAL HIGH (ref <18)

## 2023-03-08 LAB — LIPID PANEL
Cholesterol: 97 mg/dL (ref 0–200)
HDL: 27 mg/dL — ABNORMAL LOW (ref >40)
LDL Cholesterol: 45 mg/dL (ref 0–99)
Total CHOL/HDL Ratio: 3.6 {ratio}
Triglycerides: 123 mg/dL (ref <150)
VLDL: 25 mg/dL (ref 0–40)

## 2023-03-08 LAB — CBC
HCT: 25.7 % — ABNORMAL LOW (ref 36.0–46.0)
Hemoglobin: 8.3 g/dL — ABNORMAL LOW (ref 12.0–15.0)
MCH: 27.8 pg (ref 26.0–34.0)
MCHC: 32.3 g/dL (ref 30.0–36.0)
MCV: 86 fL (ref 80.0–100.0)
Platelets: 603 10*3/uL — ABNORMAL HIGH (ref 150–400)
RBC: 2.99 MIL/uL — ABNORMAL LOW (ref 3.87–5.11)
RDW: 16.6 % — ABNORMAL HIGH (ref 11.5–15.5)
WBC: 21.3 10*3/uL — ABNORMAL HIGH (ref 4.0–10.5)
nRBC: 0 % (ref 0.0–0.2)

## 2023-03-08 LAB — HEMOGLOBIN A1C
Hgb A1c MFr Bld: 4.9 % (ref 4.8–5.6)
Mean Plasma Glucose: 93.93 mg/dL

## 2023-03-08 LAB — LACTIC ACID, PLASMA: Lactic Acid, Venous: 1.5 mmol/L (ref 0.5–1.9)

## 2023-03-08 LAB — APTT
aPTT: 60 s — ABNORMAL HIGH (ref 24–36)
aPTT: 127 s — ABNORMAL HIGH (ref 24–36)

## 2023-03-08 LAB — HEPARIN LEVEL (UNFRACTIONATED): Heparin Unfractionated: >1.1 [IU]/mL — ABNORMAL HIGH (ref 0.30–0.70)

## 2023-03-08 MED ORDER — VITAMIN C 500 MG PO TABS
500.0000 mg | ORAL_TABLET | Freq: Every day | ORAL | Status: DC
  Administered 2023-03-08 – 2023-03-24 (×16): 500 mg via ORAL
  Filled 2023-03-08 (×19): qty 1

## 2023-03-08 MED ORDER — ASPIRIN 81 MG PO TBEC
81.0000 mg | DELAYED_RELEASE_TABLET | Freq: Every day | ORAL | Status: DC
  Administered 2023-03-08 – 2023-03-15 (×8): 81 mg via ORAL
  Filled 2023-03-08 (×8): qty 1

## 2023-03-08 MED ORDER — NORTRIPTYLINE HCL 25 MG PO CAPS
50.0000 mg | ORAL_CAPSULE | Freq: Every day | ORAL | Status: DC
  Administered 2023-03-08 – 2023-03-26 (×20): 50 mg via ORAL
  Filled 2023-03-08 (×22): qty 2

## 2023-03-08 MED ORDER — CALCIUM CARBONATE 1250 (500 CA) MG PO TABS
2.0000 | ORAL_TABLET | Freq: Every day | ORAL | Status: DC
  Administered 2023-03-08 – 2023-03-27 (×18): 2500 mg via ORAL
  Filled 2023-03-08 (×21): qty 2

## 2023-03-08 MED ORDER — HYDROCORTISONE SOD SUC (PF) 100 MG IJ SOLR
100.0000 mg | Freq: Two times a day (BID) | INTRAMUSCULAR | Status: DC
Start: 2023-03-09 — End: 2023-03-10
  Filled 2023-03-08: qty 2

## 2023-03-08 MED ORDER — VITAMIN A 3 MG (10000 UNIT) PO CAPS
10000.0000 [IU] | ORAL_CAPSULE | Freq: Every day | ORAL | Status: DC
  Administered 2023-03-08 – 2023-03-27 (×19): 10000 [IU] via ORAL
  Filled 2023-03-08 (×20): qty 1

## 2023-03-08 MED ORDER — DOCUSATE SODIUM 100 MG PO CAPS: 100.0000 mg | ORAL_CAPSULE | Freq: Two times a day (BID) | ORAL | Status: DC | PRN

## 2023-03-08 MED ORDER — DIPHENHYDRAMINE HCL 25 MG PO CAPS
25.0000 mg | ORAL_CAPSULE | Freq: Four times a day (QID) | ORAL | Status: DC | PRN
  Administered 2023-03-16 – 2023-03-24 (×2): 25 mg via ORAL
  Filled 2023-03-08 (×2): qty 1

## 2023-03-08 MED ORDER — MEROPENEM 1 G IV SOLR
1.0000 g | Freq: Three times a day (TID) | INTRAVENOUS | Status: DC
  Administered 2023-03-08 – 2023-03-09 (×4): 1 g via INTRAVENOUS
  Filled 2023-03-08 (×5): qty 20

## 2023-03-08 MED ORDER — PANTOPRAZOLE SODIUM 40 MG PO TBEC
40.0000 mg | DELAYED_RELEASE_TABLET | Freq: Every day | ORAL | Status: DC
  Administered 2023-03-08 – 2023-03-27 (×20): 40 mg via ORAL
  Filled 2023-03-08 (×20): qty 1

## 2023-03-08 MED ORDER — MELATONIN 5 MG PO TABS
2.5000 mg | ORAL_TABLET | Freq: Every evening | ORAL | Status: DC | PRN
  Administered 2023-03-17 – 2023-03-24 (×2): 2.5 mg via ORAL
  Filled 2023-03-08 (×3): qty 1

## 2023-03-08 MED ORDER — VITAMIN D 25 MCG (1000 UNIT) PO TABS
1000.0000 [IU] | ORAL_TABLET | Freq: Every day | ORAL | Status: DC
  Administered 2023-03-08 – 2023-03-27 (×20): 1000 [IU] via ORAL
  Filled 2023-03-08 (×20): qty 1

## 2023-03-08 MED ORDER — VITAMIN B-12 1000 MCG PO TABS
1000.0000 ug | ORAL_TABLET | Freq: Every day | ORAL | Status: DC
  Administered 2023-03-08 – 2023-03-27 (×20): 1000 ug via ORAL
  Filled 2023-03-08 (×17): qty 1
  Filled 2023-03-08: qty 2
  Filled 2023-03-08 (×2): qty 1

## 2023-03-08 MED ORDER — GABAPENTIN 300 MG PO CAPS
300.0000 mg | ORAL_CAPSULE | Freq: Three times a day (TID) | ORAL | Status: DC
  Administered 2023-03-08 – 2023-03-27 (×56): 300 mg via ORAL
  Filled 2023-03-08 (×58): qty 1

## 2023-03-08 MED ORDER — HEPARIN (PORCINE) 25000 UT/250ML-% IV SOLN
1250.0000 [IU]/h | INTRAVENOUS | Status: DC
  Administered 2023-03-08: 1350 [IU]/h via INTRAVENOUS
  Administered 2023-03-08: 1200 [IU]/h via INTRAVENOUS
  Administered 2023-03-09: 1250 [IU]/h via INTRAVENOUS
  Filled 2023-03-08 (×3): qty 250

## 2023-03-08 MED ORDER — FOLIC ACID 1 MG PO TABS
1.0000 mg | ORAL_TABLET | Freq: Every day | ORAL | Status: DC
  Administered 2023-03-08 – 2023-03-27 (×19): 1 mg via ORAL
  Filled 2023-03-08 (×22): qty 1

## 2023-03-08 MED ORDER — VANCOMYCIN HCL 1500 MG/300ML IV SOLN
1500.0000 mg | Freq: Once | INTRAVENOUS | Status: AC
  Administered 2023-03-08: 1500 mg via INTRAVENOUS
  Filled 2023-03-08: qty 300

## 2023-03-08 MED ORDER — VANCOMYCIN HCL 1750 MG/350ML IV SOLN
1750.0000 mg | INTRAVENOUS | Status: DC
Start: 2023-03-08 — End: 2023-03-09
  Administered 2023-03-08: 1750 mg via INTRAVENOUS
  Filled 2023-03-08 (×3): qty 350

## 2023-03-08 MED ORDER — FERROUS SULFATE 325 (65 FE) MG PO TABS
325.0000 mg | ORAL_TABLET | Freq: Two times a day (BID) | ORAL | Status: DC
  Administered 2023-03-08 – 2023-03-27 (×38): 325 mg via ORAL
  Filled 2023-03-08 (×38): qty 1

## 2023-03-08 MED ORDER — THIAMINE MONONITRATE 100 MG PO TABS
100.0000 mg | ORAL_TABLET | Freq: Every day | ORAL | Status: DC
  Administered 2023-03-08 – 2023-03-27 (×19): 100 mg via ORAL
  Filled 2023-03-08 (×20): qty 1

## 2023-03-08 MED ORDER — FLUTICASONE PROPIONATE 50 MCG/ACT NA SUSP
1.0000 | Freq: Every day | NASAL | Status: DC
  Administered 2023-03-08 – 2023-03-27 (×19): 1 via NASAL
  Filled 2023-03-08 (×3): qty 16

## 2023-03-08 MED ORDER — HYDROCORTISONE 5 MG PO TABS: 5.0000 mg | ORAL_TABLET | Freq: Three times a day (TID) | ORAL | Status: DC

## 2023-03-08 MED ORDER — FAMOTIDINE 20 MG PO TABS
20.0000 mg | ORAL_TABLET | Freq: Two times a day (BID) | ORAL | Status: DC
  Administered 2023-03-08 – 2023-03-27 (×38): 20 mg via ORAL
  Filled 2023-03-08 (×39): qty 1

## 2023-03-08 MED ORDER — MONTELUKAST SODIUM 10 MG PO TABS
10.0000 mg | ORAL_TABLET | Freq: Every day | ORAL | Status: DC
  Administered 2023-03-08 – 2023-03-27 (×20): 10 mg via ORAL
  Filled 2023-03-08 (×20): qty 1

## 2023-03-08 MED ORDER — HEPARIN BOLUS VIA INFUSION
1350.0000 [IU] | Freq: Once | INTRAVENOUS | Status: AC
  Administered 2023-03-08: 1350 [IU] via INTRAVENOUS
  Filled 2023-03-08: qty 1350

## 2023-03-08 MED ORDER — VANCOMYCIN HCL 1500 MG/300ML IV SOLN: 1500.0000 mg | INTRAVENOUS | Status: DC

## 2023-03-08 MED ORDER — LORATADINE 10 MG PO TABS
10.0000 mg | ORAL_TABLET | Freq: Every evening | ORAL | Status: DC
  Administered 2023-03-08 – 2023-03-26 (×18): 10 mg via ORAL
  Filled 2023-03-08 (×19): qty 1

## 2023-03-08 MED ORDER — POLYVINYL ALCOHOL 1.4 % OP SOLN
1.0000 [drp] | Freq: Three times a day (TID) | OPHTHALMIC | Status: DC
  Administered 2023-03-08 – 2023-03-27 (×54): 1 [drp] via OPHTHALMIC
  Filled 2023-03-08 (×4): qty 15

## 2023-03-08 MED ORDER — SODIUM CHLORIDE 0.9 % IV BOLUS
500.0000 mL | Freq: Once | INTRAVENOUS | Status: AC
  Administered 2023-03-08: 500 mL via INTRAVENOUS

## 2023-03-08 NOTE — Assessment & Plan Note (Signed)
 Troponin peaked at 251 and then started trending down.  No chest pain.  Likely demand ischemia with concern of sepsis. -Echocardiogram ordered -Continue aspirin 81 mg daily -Continue heparin infusion for now and if echo is normal we will discontinue

## 2023-03-08 NOTE — Progress Notes (Addendum)
 ANTICOAGULATION CONSULT NOTE  Pharmacy Consult for heparin infusion Indication: NSTEMI  Allergies  Allergen Reactions   Hydrocodone-Acetaminophen Swelling    hands   Iodine Anaphylaxis and Swelling    Iv dye 02/03/21-Ispoke to patient and she had IV contrast  X4 last year and has had no problem. ( Last CT abdomen with contrast was on 01/02/21.-    Other Other (See Comments)    ALLERGY TO METAL - BLACKENS SKIN AND CAUSES A RASH    Tape Swelling and Other (See Comments)    Skin comes off.  Paper tape is ok tegaderm OK   Peanut Oil Other (See Comments)    ** Nuts cause runny nose   Shellfish Allergy Nausea And Vomiting and Swelling    Episode of GI infection after eating clam chowder. Still eats shrimp and other seafood    Patient Measurements: Height: 5\' 8"  (172.7 cm) Weight: 117.3 kg (258 lb 11.2 oz) IBW/kg (Calculated) : 63.9 Heparin Dosing Weight: 91.1 kg  Vital Signs: Temp: 98.5 F (36.9 C) (02/23 2358) Temp Source: Oral (02/23 2208) BP: 101/70 (02/23 2358) Pulse Rate: 122 (02/23 2358)  Labs: Recent Labs    03/07/23 2036 03/07/23 2251  HGB 8.6*  --   HCT 26.5*  --   PLT 585*  --   APTT 57*  --   LABPROT 22.1*  --   INR 1.9*  --   CREATININE 0.84  --   TROPONINIHS 217* 251*    Estimated Creatinine Clearance: 82.7 mL/min (by C-G formula based on SCr of 0.84 mg/dL).   Medical History: Past Medical History:  Diagnosis Date   Allergic rhinitis    Arthritis    Asthma    problems with fumes and aerosols cause asthma   Chest pain 10/19/2016   CHF (congestive heart failure) (HCC)    Complication of anesthesia    awakens during surgery; has occurred with last 3-4 surgeries    COPD (chronic obstructive pulmonary disease) (HCC)    DVT (deep venous thrombosis) (HCC) 07/13/2018   Right leg   Environmental allergies    fumes    Fibromyalgia    GERD (gastroesophageal reflux disease)    Headache    History of bronchitis    Hx of total knee arthroplasty  12/13/2015   Hyperlipidemia    Hypertension    Morbid obesity with BMI of 45.0-49.9, adult (HCC) 07/25/2015   NICM (nonischemic cardiomyopathy) (HCC)    a. ? PVC mediated;  b. 06/2016 Echo: EF 25-30%, mild LVH, mild LAE, mild AI/MR/TR/PR; c. 08/2016 Cath: nl cors, EF 25%.   Numbness    hands bilat when driving; improves when not preforming task    Osteoarthritis of left knee 08/02/2015   PE (pulmonary thromboembolism) (HCC) 07/13/2018   Pes anserinus bursitis of left knee 05/18/2016   Presence of right artificial knee joint 05/18/2016   PVC (premature ventricular contraction) 06/04/2017   PVC's (premature ventricular contractions)    a. 11/2016 Amio started;  b. 11/29/16 48h Holter: 57574 PVC's (41%); c. 12/2016 24h Holter: 18040 PVC's (15%); c. 02/2017 48h Holter: 37262 PVC's (41%).   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   Temporal arteritis (HCC) 05/01/2020   Tinnitus    comes and goes    Unilateral primary osteoarthritis, right knee 12/13/2015   Vertigo    none for over 2  yrs    Medications:  PTA Meds: Apixaban 5 mg BID, last dose unknown  Assessment: Pt is a 73 yo female with h/o PE on Eliquis presenting to ED with recurrent UTI, now found with NSTEMI, elevated troponin I level - trending up.  Goal of Therapy:  Heparin level 0.3-0.7 units/ml aPTT 66-102 seconds Monitor platelets by anticoagulation protocol: Yes  Baseline aPTT 57 & INR 1.9, both elevated  Labs drawn 2/23 @ 2036, unable to order add-on HL, will check w/ 1st aPTT after infusion started.   Plan:  No initial bolus Start heparin infusion at 1200 units/hr Will follow aPTT until correlation w/ HL confirmed Will check aPTT in 8 hr after start of infusion HL & CBC daily while on heparin  Otelia Sergeant, PharmD, [facility] 03/08/2023 12:16 AM

## 2023-03-08 NOTE — Assessment & Plan Note (Signed)
 Pressure Injury 03/08/23 Ischial tuberosity Left Stage 2 -  Partial thickness loss of dermis presenting as a shallow open injury with a red, pink wound bed without slough. (Active)  03/08/23 1100  Location: Ischial tuberosity  Location Orientation: Left  Staging: Stage 2 -  Partial thickness loss of dermis presenting as a shallow open injury with a red, pink wound bed without slough.  Wound Description (Comments):   Present on Admission: Yes     Pressure Injury 03/08/23 Ischial tuberosity Right Stage 2 -  Partial thickness loss of dermis presenting as a shallow open injury with a red, pink wound bed without slough. (Active)  03/08/23 1100  Location: Ischial tuberosity  Location Orientation: Right  Staging: Stage 2 -  Partial thickness loss of dermis presenting as a shallow open injury with a red, pink wound bed without slough.  Wound Description (Comments):   Present on Admission: Yes   Present on admission

## 2023-03-08 NOTE — Progress Notes (Signed)
 ANTICOAGULATION CONSULT NOTE  Pharmacy Consult for heparin infusion Indication: NSTEMI  Allergies  Allergen Reactions   Hydrocodone-Acetaminophen Swelling    hands   Iodine Anaphylaxis and Swelling    Iv dye 02/03/21-Ispoke to patient and she had IV contrast  X4 last year and has had no problem. ( Last CT abdomen with contrast was on 01/02/21.-    Other Other (See Comments)    ALLERGY TO METAL - BLACKENS SKIN AND CAUSES A RASH    Tape Swelling and Other (See Comments)    Skin comes off.  Paper tape is ok tegaderm OK   Peanut Oil Other (See Comments)    ** Nuts cause runny nose   Shellfish Allergy Nausea And Vomiting and Swelling    Episode of GI infection after eating clam chowder. Still eats shrimp and other seafood    Patient Measurements: Height: 5\' 8"  (172.7 cm) Weight: 117.3 kg (258 lb 11.2 oz) IBW/kg (Calculated) : 63.9 Heparin Dosing Weight: 91.1 kg  Vital Signs: Temp: 98.1 F (36.7 C) (02/24 1204) Temp Source: Oral (02/24 1204) BP: 100/58 (02/24 1130) Pulse Rate: 85 (02/24 1130)  Labs: Recent Labs    03/07/23 2036 03/07/23 2251 03/08/23 0341 03/08/23 0515 03/08/23 1054  HGB 8.6*  --   --  8.3*  --   HCT 26.5*  --   --  25.7*  --   PLT 585*  --   --  603*  --   APTT 57*  --   --   --  60*  LABPROT 22.1*  --   --   --   --   INR 1.9*  --   --   --   --   HEPARINUNFRC  --   --   --   --  >1.10*  CREATININE 0.84  --  0.73  --   --   TROPONINIHS 217*   < > 150* 113* 61*   < > = values in this interval not displayed.    Estimated Creatinine Clearance: 86.9 mL/min (by C-G formula based on SCr of 0.73 mg/dL).   Medical History: Past Medical History:  Diagnosis Date   Allergic rhinitis    Arthritis    Asthma    problems with fumes and aerosols cause asthma   Chest pain 10/19/2016   CHF (congestive heart failure) (HCC)    Complication of anesthesia    awakens during surgery; has occurred with last 3-4 surgeries    COPD (chronic obstructive pulmonary  disease) (HCC)    DVT (deep venous thrombosis) (HCC) 07/13/2018   Right leg   Environmental allergies    fumes    Fibromyalgia    GERD (gastroesophageal reflux disease)    Headache    History of bronchitis    Hx of total knee arthroplasty 12/13/2015   Hyperlipidemia    Hypertension    Morbid obesity with BMI of 45.0-49.9, adult (HCC) 07/25/2015   NICM (nonischemic cardiomyopathy) (HCC)    a. ? PVC mediated;  b. 06/2016 Echo: EF 25-30%, mild LVH, mild LAE, mild AI/MR/TR/PR; c. 08/2016 Cath: nl cors, EF 25%.   Numbness    hands bilat when driving; improves when not preforming task    Osteoarthritis of left knee 08/02/2015   PE (pulmonary thromboembolism) (HCC) 07/13/2018   Pes anserinus bursitis of left knee 05/18/2016   Presence of right artificial knee joint 05/18/2016   PVC (premature ventricular contraction) 06/04/2017   PVC's (premature ventricular contractions)    a. 11/2016 Amio  started;  b. 11/29/16 48h Holter: 57574 PVC's (41%); c. 12/2016 24h Holter: 18040 PVC's (15%); c. 02/2017 48h Holter: 37262 PVC's (41%).   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   Temporal arteritis (HCC) 05/01/2020   Tinnitus    comes and goes    Unilateral primary osteoarthritis, right knee 12/13/2015   Vertigo    none for over 2 yrs    Medications:  PTA Meds: Apixaban 5 mg BID, last dose 10/23 evening  Assessment: Pt is a 73 yo female with h/o PE on Eliquis presenting to ED with recurrent UTI, now found with NSTEMI, elevated troponin I level - trending up. Baseline INR elevated at 1.9  Goal of Therapy:  Heparin level 0.3-0.7 units/ml aPTT 66-102 seconds Monitor platelets by anticoagulation protocol: Yes  Baseline aPTT 57 & INR 1.9, both elevated   Monitoring: 2/24 @ 1054: HL >1.10, aPTT 60 (subtherapeutic, not correlating)  Plan:  Give 1350 units bolus x1;  then increase rate of heparin infusion to 1350 units/hour. Check aPTT in 8 hours; heparin level with tomorrow AM labs Continue to titrate heparin using aPTT until aPTT and HL are correlating Continue to monitor CBC daily while on heparin infusion.  Will M. Dareen Piano, PharmD Clinical Pharmacist 03/08/2023 12:34 PM

## 2023-03-08 NOTE — Discharge Instructions (Addendum)
 Transportation Resources  Agency Name: [facility] Agency Address: [address] [place], [place] [address] Phone: [phone] Email: [email or web address] Website: [email or web address] Service(s) Offered: Housing services, self-sufficiency, congregate meal program, weatherization program, Field seismologist program, emergency food assistance,  housing counseling, home ownership program, wheels-towork program.  Agency Name: [facility] [employer] [phone]) Address: [address] [place], [place] [address] Phone: [phone] Website: [email or web address] Service(s) Offered: Transportation for [employer], subscription and demand response; Dial-a-Ride for citizens 66 years of age or older.  Agency Name: Department [employer] Social Services Address: [address] [place], [place] [address] Phone: [phone] Service(s) Offered: Child support services; child welfare services; food stamps; Medicaid; work first family assistance; and aid with fuel,  rent, food and medicine, transportation assistance.  Agency Name: [employer] ([employer]) Transportation  Network Phone: [phone] Service(s) Offered: Transports veterans to the [facility] medical center. Call  forty-eight hours in advance and leave the name, telephone  number, date, and time of appointment. Veteran will be  contacted by the driver the day before the appointment to  arrange a pick up point   Transportation Resources  Agency Name: [facility] Agency Address: [address] [place], [place] [address] Phone: [phone] Email: [email or web address] Website: [email or web address] Service(s) Offered: Housing services, self-sufficiency, congregate meal program, weatherization program, Field seismologist program, emergency food assistance,  housing counseling, home ownership program, wheels-towork  program.  Agency Name: [facility] [employer] [phone]) Address: [address], [place], [place] [address] Phone: [phone] Website: [email or web address] Service(s) Offered: Transportation for [employer], subscription and demand response; Dial-a-Ride for citizens 92 years of age or older.  Agency Name: Department of Social Services Address: [address] [place], [place] [address] Phone: [phone] Service(s) Offered: Child support services; child welfare services; food stamps; Medicaid; work first family assistance; and aid with fuel,  rent, food and medicine, transportation assistance.  Agency Name: [employer]abled [employer] (DAV) Transportation  Network Phone: [phone] Service(s) Offered: Transports veterans to the [facility] medical center. Call  forty-eight hours in advance and leave the name, telephone  number, date, and time of appointment. Veteran will be  contacted by the driver the day before the appointment to  arrange a pick up point    [employer] currently provides door to door services. ACTA connects with [employer] daily for services to [facility]. [employer] also performs contract services to [employer] operates 27 vehicles, all but 3 mini-vans are equipped with lifts for special needs as well as the general public. [employer] drivers are each CDL certified and trained in First Aid and CPR. [employer] was established in 2002 by [employer]. An independent Industrial/product designer. [employer] operates via Cytogeneticist with required Research scientist (physical sciences) from [place]. [employer] provides over 80,000 passenger trips each year, including [facility] and [employer] sites.  Call at least by 11 AM one business day prior to needing transportation  [employer].                      [place], [place] [address]     Office  Hours: Monday-Friday  8 AM - 5 PM   [facility], Occupational Therapy, Nurse, Nurse Aide and Social Worker  WOUND Care Lower legs - Unna Boots change every Thursday; substitute Kerlix wrap then Ace wrap Sacral wound - apply Aquacel #[id number] cover with foam  change daily and as needed

## 2023-03-08 NOTE — Assessment & Plan Note (Signed)
 2D echo on 10/07/2022 showed EF of 40-45%.  Patient has 1+ leg edema, but no worsening shortness breath, does not seem to have CHF exacerbation.  BNP of 193 -Hold diuretics due to hypotension -Monitor volume status closely -Repeat echocardiogram ordered

## 2023-03-08 NOTE — Assessment & Plan Note (Signed)
 Continue home meds

## 2023-03-08 NOTE — ED Notes (Signed)
 Pt repositioned in bed and provided breakfast tray. Food cut. Pt denies further needs at this time

## 2023-03-08 NOTE — Progress Notes (Signed)
 Pharmacy Antibiotic Note  LOTUS SANTILLO is a 73 y.o. female admitted on 03/07/2023 with severe sepsis with septic shock, possible UTI.  Pharmacy has been consulted for Meropenem & Vancomycin dosing.  Plan: Continue meropenem 1 gm q8hr per indication & renal fxn.  Change vancomycin to 1750 mg IV every 24 hours Goal AUC 400-550. Expected AUC: 510 SCr used: 0.80 (rounded up from 0.72), Vd used: 0.5, BMI: 39.2  Pharmacy will continue to follow and will adjust abx dosing whenever warranted.  Temp (24hrs), Avg:99 F (37.2 C), Min:98.4 F (36.9 C), Max:99.7 F (37.6 C)   Recent Labs  Lab 03/07/23 2035 03/07/23 2036 03/07/23 2251 03/07/23 2353 03/08/23 0341 03/08/23 0515  WBC  --  10.3  --   --   --  21.3*  CREATININE  --  0.84  --   --  0.73  --   LATICACIDVEN 1.7  --  2.2* 1.5  --   --     Estimated Creatinine Clearance: 86.9 mL/min (by C-G formula based on SCr of 0.73 mg/dL).    Allergies  Allergen Reactions   Hydrocodone-Acetaminophen Swelling    hands   Iodine Anaphylaxis and Swelling    Iv dye 02/03/21-Ispoke to patient and she had IV contrast  X4 last year and has had no problem. ( Last CT abdomen with contrast was on 01/02/21.-    Other Other (See Comments)    ALLERGY TO METAL - BLACKENS SKIN AND CAUSES A RASH    Tape Swelling and Other (See Comments)    Skin comes off.  Paper tape is ok tegaderm OK   Peanut Oil Other (See Comments)    ** Nuts cause runny nose   Shellfish Allergy Nausea And Vomiting and Swelling    Episode of GI infection after eating clam chowder. Still eats shrimp and other seafood    Antimicrobials this admission: 2/23 Cefepime >> x 1 dose 2/23 Vancomycin >>  2/24 Meropenem >>   Microbiology results: 2/23 BCx: Pending 2/23 UCx: Pending   Thank you for allowing pharmacy to be a part of this patient's care.  Barrie Folk, PharmD 03/08/2023 7:04 AM

## 2023-03-08 NOTE — Hospital Course (Addendum)
 Taken from H&P  Wendy Sanchez is a 73 y.o. female with medical history significant of COPD, CHF with EF of 40-45%, depression, obesity, anemia, DVT and PE on Eliquis, temporal arteritis, renal cell carcinoma, adrenal insufficiency, s/p for colostomy secondary to perforated diverticulitis, who presents with fever, increased urinary frequency.   Patient was found to have hypotension with blood pressure 86/59, which improved to 101/70 after giving 2 L LR bolus in ED, but blood pressure has dropped again to 82/61 with MAP of 69 when I saw pt in ED. Her mental status is normal.  pt was found to have WBC 10.3, GFR> 60, negative PCR for COVID, flu and RSV, UA (hazy appearance, small amount of leukocyte, rare bacteria, WBC 11-20), lactic acid of 1.7 --> 2.2 --> 1.5, troponin 217 --> 251.  Chest x-ray negative for infiltration.  EKG: Sinus rhythm, QTc 484, LAE, LAD, poor R wave progression, low voltage.   2/24: Blood pressure with some improvement to 97/63, patient was started on midodrine and stress doses of steroid.  Elevated leukocytosis today likely secondary to stress doses of steroid.  Preliminary blood cultures negative.  Urine cultures pending.  Troponin peaked at 251 and now trending down.  Family is very concerned about this recurrent UTI, patient has multiple hospitalization for this and has been seen outpatient urology, there were recommending continuing antibiotics at suppressive doses.  2/25: Vital stable.  Blood pressure seems within goal now.  Mild hypokalemia which are being repleted.  Blood cultures remain negative, urine cultures with no growth.  Stopping antibiotics.  Sepsis ruled out.  Increasing the dose of midodrine. Had an episode of sinus tachycardia, restarting home metoprolol and torsemide.  PT is recommending SNF, not sure if she has any days left  2/26: Vital stable with blood pressure in low 100s.  Echo with EF of 35 to 40%, left ventricular global hypokinesis and severely  dilated cavity, grade 1 diastolic dysfunction.  Small pericardial effusion. Patient currently stable to go back to her facility, she needs another insurance authorization which was started today.  She will go back once insurance authorization got approval.  Patient is high risk for readmission and mortality based on underlying comorbidities and poor functional status.  Palliative care was also consulted.  3/5: Patient with new leukocytosis, abdominal pain, watery diarrhea and upper respiratory symptoms.  Respiratory panel was checked and came back positive for RSV.  Patient was also recently started on pyridostigmine which can cause abdominal pain and diarrhea per ID, ordered CT abdomen to rule out any colitis.  Cardiology was also consulted and patient underwent cardiac MRI and right and left cardiac catheterization with following results Cardiac MRI:  1.  Small circumferential pericardial effusion. 2.  Mildly dilated LV with diffuse hypokinesis, EF 30%. 3.  Normal RV size with RV EF 38%. 4. Difficult delayed enhancement images, looked hard to null LV myocardium. Possible small area of mid-wall LGE in the mid inferior wall, nonspecific. 5.  Extracellular volume percentage significantly elevated at 41%.   RHC/LHC: 1. Low filling pressures.  2. Normal cardiac output.  3. 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  4. 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 She was also started on amiodarone for concern of SVT and PVCs.  3/6: Febrile at 101.4 over the past 24-hour.  Patient with more upper respiratory symptoms.  Continue to have mild abdominal discomfort but mostly pointing towards epigastrium, loose stools in  colostomy bag.  Improving leukocytosis, C. difficile came back positive for antigen and negative for toxin-pending PCR.  GI pathogen panel and UA was also ordered and pending.   CT abdomen was negative for any concern of colitis or diverticulitis.  Chest x-ray normal.  Likely due to RSV  3/7: C. difficile PCR came back positive so started on p.o. vancomycin.  GI pathogen panel is also positive for EPEC-no need to treat per ID. Hemoglobin decreased to 7.7 without any obvious bleeding, FOBT was negative.  Anemia panel with low iron, TIBC was not calculated. Likely anemia of chronic disease with mild iron deficiency. UA with large leukocytes-urine cultures ordered Also developed mild hyponatremia-giving some normal saline  3/8: Overall stable with stable labs, hyponatremia improving.  Urine cultures pending.  3/9: Slowly improving respiratory symptoms.  Abdominal pain has been resolved with improving diarrhea.  Persistently low albumin so started on albumin infusion for 3 days after discussing with cardiology.  3/10: Remained hemodynamically stable, albumin at 1.9 and potassium of 3.3 today.  Pending insurance authorization for rehab.  Urine cultures with multiple species.  3/11: Patient remained stable and continued to improve.  Pending insurance authorization for rehab.  3/12: Still pending insurance authorization for rehab. Mild hypokalemia which is being repleted.  3/13: Remained hemodynamically stable, day 7 of p.o. vancomycin.  Insurance declined rehab even with peer to peer stating that she has very poor underlying functional status and she need either long-term care or assisted living.  TOC is looking for other options, not sure whether she has insurance for that or not.  3/14: Appeal for insurance authorization for SNF has been denied. The patient will be discharged to home with family in am by 10:00 am.

## 2023-03-08 NOTE — ED Notes (Signed)
 Lab called for blood draw.

## 2023-03-08 NOTE — Assessment & Plan Note (Signed)
 Patient was on Eliquis at home -Currently Eliquis is being switched with IV heparin

## 2023-03-08 NOTE — Progress Notes (Signed)
 Pharmacy Antibiotic Note  Wendy Sanchez is a 73 y.o. female admitted on 03/07/2023 with severe sepsis with septic shock, possible UTI.  Pharmacy has been consulted for Meropenem & Vancomycin dosing.  Plan: Meropenem 1 gm q8hr per indication & renal fxn.  Pt given Vancomycin 2500 mg once. Vancomycin 1500 mg IV Q 24 hrs. Goal AUC 400-550. Expected AUC: 458.1 SCr used: 0.84, Vd used: 0.5, BMI: 39.2  Pharmacy will continue to follow and will adjust abx dosing whenever warranted.  Temp (24hrs), Avg:99.2 F (37.3 C), Min:98.5 F (36.9 C), Max:99.7 F (37.6 C)   Recent Labs  Lab 03/07/23 2035 03/07/23 2036 03/07/23 2251  WBC  --  10.3  --   CREATININE  --  0.84  --   LATICACIDVEN 1.7  --  2.2*    Estimated Creatinine Clearance: 82.7 mL/min (by C-G formula based on SCr of 0.84 mg/dL).    Allergies  Allergen Reactions   Hydrocodone-Acetaminophen Swelling    hands   Iodine Anaphylaxis and Swelling    Iv dye 02/03/21-Ispoke to patient and she had IV contrast  X4 last year and has had no problem. ( Last CT abdomen with contrast was on 01/02/21.-    Other Other (See Comments)    ALLERGY TO METAL - BLACKENS SKIN AND CAUSES A RASH    Tape Swelling and Other (See Comments)    Skin comes off.  Paper tape is ok tegaderm OK   Peanut Oil Other (See Comments)    ** Nuts cause runny nose   Shellfish Allergy Nausea And Vomiting and Swelling    Episode of GI infection after eating clam chowder. Still eats shrimp and other seafood    Antimicrobials this admission: 2/23 Cefepime >> x 1 dose 2/23 Vancomycin >>  2/24 Meropenem >>   Microbiology results: 2/23 BCx: Pending 2/23 UCx: Pending   Thank you for allowing pharmacy to be a part of this patient's care.  Otelia Sergeant, PharmD, [facility] 03/08/2023 12:13 AM

## 2023-03-08 NOTE — ED Notes (Signed)
 Assumed care and received report from the previous nurse.

## 2023-03-08 NOTE — Assessment & Plan Note (Signed)
 Severe sepsis with concern of septic shock Recurrent UTI Patient met sepsis criteria with tachycardia and tachypnea, no leukocytosis on admission but did had significant leukocytosis today which can be due to stress doses of steroid.  History of recurrent UTI which has been worked out as outpatient and according to care everywhere her urologist recommend continuation of low-dose antibiotics.  Preliminary blood cultures negative, urine cultures pending. Blood pressure still little soft, patient was on midodrine. Lactic acidosis resolved -Continuing broad-spectrum antibiotics with meropenem and vancomycin for now until the results of some cultures are back

## 2023-03-08 NOTE — ED Notes (Signed)
 Talked to RN Christell Constant at [facility] and let them know this pt was being admitted.

## 2023-03-08 NOTE — Assessment & Plan Note (Signed)
-  As needed Tylenol and oxycodone

## 2023-03-08 NOTE — Assessment & Plan Note (Signed)
 No concern of exacerbation. -Continue home bronchodilators

## 2023-03-08 NOTE — Assessment & Plan Note (Signed)
 Estimated body mass index is 39.34 kg/m as calculated from the following:   Height as of this encounter: 5\' 8"  (1.727 m).   Weight as of this encounter: 117.3 kg.   -This will complicate overall prognosis -Encouraged weight loss

## 2023-03-08 NOTE — Progress Notes (Signed)
 Progress Note   Patient: Wendy Sanchez:[id number] DOB: 12/31/1950 DOA: 03/07/2023     1 DOS: the patient was seen and examined on 03/08/2023   Brief hospital course: Taken from H&P  Wendy Sanchez is a 73 y.o. female with medical history significant of COPD, CHF with EF of 40-45%, depression, obesity, anemia, DVT and PE on Eliquis, temporal arteritis, renal cell carcinoma, adrenal insufficiency, s/p for colostomy secondary to perforated diverticulitis, who presents with fever, increased urinary frequency.   Patient was found to have hypotension with blood pressure 86/59, which improved to 101/70 after giving 2 L LR bolus in ED, but blood pressure has dropped again to 82/61 with MAP of 69 when I saw pt in ED. Her mental status is normal.  pt was found to have WBC 10.3, GFR> 60, negative PCR for COVID, flu and RSV, UA (hazy appearance, small amount of leukocyte, rare bacteria, WBC 11-20), lactic acid of 1.7 --> 2.2 --> 1.5, troponin 217 --> 251.  Chest x-ray negative for infiltration.  EKG: Sinus rhythm, QTc 484, LAE, LAD, poor R wave progression, low voltage.   2/24: Blood pressure with some improvement to 97/63, patient was started on midodrine and stress doses of steroid.  Elevated leukocytosis today likely secondary to stress doses of steroid.  Preliminary blood cultures negative.  Urine cultures pending.  Troponin peaked at 251 and now trending down.  Family is very concerned about this recurrent UTI, patient has multiple hospitalization for this and has been seen outpatient urology, there were recommending continuing antibiotics at suppressive doses.  Assessment and Plan: * Sepsis (HCC) Severe sepsis with concern of septic shock Recurrent UTI Patient met sepsis criteria with tachycardia and tachypnea, no leukocytosis on admission but did had significant leukocytosis today which can be due to stress doses of steroid.  History of recurrent UTI which has been worked out as outpatient and  according to care everywhere her urologist recommend continuation of low-dose antibiotics.  Preliminary blood cultures negative, urine cultures pending. Blood pressure still little soft, patient was on midodrine. Lactic acidosis resolved -Continuing broad-spectrum antibiotics with meropenem and vancomycin for now until the results of some cultures are back  Secondary adrenal insufficiency [facility]) Patient received stress doses of Solu-Cortef Continue home Cortef  Myocardial injury Troponin peaked at 251 and then started trending down.  No chest pain.  Likely demand ischemia with concern of sepsis. -Echocardiogram ordered -Continue aspirin 81 mg daily -Continue heparin infusion for now and if echo is normal we will discontinue  Chronic obstructive pulmonary disease (COPD) (HCC) No concern of exacerbation. -Continue home bronchodilators  Chronic pain syndrome -As needed Tylenol and oxycodone  HFrEF (heart failure with reduced ejection fraction) (HCC)  2D echo on 10/07/2022 showed EF of 40-45%.  Patient has 1+ leg edema, but no worsening shortness breath, does not seem to have CHF exacerbation.  BNP of 193 -Hold diuretics due to hypotension -Monitor volume status closely -Repeat echocardiogram ordered  History of pulmonary embolus 2020(PE) Patient was on Eliquis at home -Currently Eliquis is being switched with IV heparin  Depression with anxiety -Continue home meds  Obesity (BMI 30-39.9) Estimated body mass index is 39.34 kg/m as calculated from the following:   Height as of this encounter: 5\' 8"  (1.727 m).   Weight as of this encounter: 117.3 kg.   -This will complicate overall prognosis -Encouraged weight loss  Pressure injury of skin Pressure Injury 03/08/23 Ischial tuberosity Left Stage 2 -  Partial thickness loss of dermis  presenting as a shallow open injury with a red, pink wound bed without slough. (Active)  03/08/23 1100  Location: Ischial tuberosity  Location  Orientation: Left  Staging: Stage 2 -  Partial thickness loss of dermis presenting as a shallow open injury with a red, pink wound bed without slough.  Wound Description (Comments):   Present on Admission: Yes     Pressure Injury 03/08/23 Ischial tuberosity Right Stage 2 -  Partial thickness loss of dermis presenting as a shallow open injury with a red, pink wound bed without slough. (Active)  03/08/23 1100  Location: Ischial tuberosity  Location Orientation: Right  Staging: Stage 2 -  Partial thickness loss of dermis presenting as a shallow open injury with a red, pink wound bed without slough.  Wound Description (Comments):   Present on Admission: Yes   Present on admission     Subjective: Patient was seen and examined today.  She was very concerned that why she is keep getting UTI.  Denies any abdominal pain, nausea, vomiting or diarrhea.  No chest pain or shortness of breath.  When asked about urinary symptoms she states that she always have increased frequency but no pain.  Physical Exam: Vitals:   03/08/23 1000 03/08/23 1130 03/08/23 1204 03/08/23 1230  BP: 99/69 (!) 100/58  90/61  Pulse: 89 85  85  Resp: 17 17  (!) 22  Temp:   98.1 F (36.7 C)   TempSrc:   Oral   SpO2: 100% 100%  100%  Weight:      Height:       General.  Obese lady, in no acute distress. Pulmonary.  Lungs clear bilaterally, normal respiratory effort. CV.  Regular rate and rhythm, no JVD, rub or murmur. Abdomen.  Soft, nontender, nondistended, BS positive. CNS.  Alert and oriented .  No focal neurologic deficit. Extremities.  1+ LE edema, no cyanosis, pulses intact and symmetrical.  Data Reviewed: Prior data reviewed  Family Communication: Discussed with sister at bedside  Disposition: Status is: Inpatient Remains inpatient appropriate because: Severity of illness  Planned Discharge Destination: Skilled nursing facility  Time spent: 50 minutes  This record has been created using Software engineer. Errors have been sought and corrected,but may not always be located. Such creation errors do not reflect on the standard of care.   Author: Arnetha Courser, MD 03/08/2023 3:44 PM  For on call review [email or web address].

## 2023-03-08 NOTE — TOC Initial Note (Signed)
 Transition of Care [facility]) - Initial/Assessment Note    Patient Details  Name: Wendy Sanchez MRN: [id number] Date of Birth: Oct 28, 1950  Transition of Care [facility]) CM/SW Contact:    Margarito Liner, LCSW Phone Number: 03/08/2023, 11:10 AM  Clinical Narrative:   Readmission prevention screen complete. CSW met with patient. No family at bedside. CSW introduced role and explained that discharge planning would be discussed. PCP is at [facility] in [place]. She reports transportation being an issue because it costs around $300 for ambulance transport and the local wheelchair [facility] service will not go into [facility]. CSW added transportation resources to her AVS. Pharmacy is [facility] in [place]. No issues obtaining medications. Patient lives at home and son stays with her. Son was recently injured. Patient was admitted from [facility]. She went in on 1/15 until 1/17 when she was hospitalized. She went back on 1/23 until yesterday when she was readmitted. CSW left a message for the admissions coordinator to see if she would have to pay anything up front to return. Patient asked CSW to contact her sister. She stated her sister is her primary HCPOA and son is secondary. CSW will call sister once update received from [facility] admissions coordinator. MD will consult PT and OT. No further concerns. CSW will continue to follow patient for support and facilitate discharge once medically stable.               Expected Discharge Plan: Skilled Nursing Facility Barriers to Discharge: Continued Medical Work up   Patient Goals and CMS Choice            Expected Discharge Plan and Services     Post Acute Care Choice: Skilled Nursing Facility Living arrangements for the past 2 months: Skilled Nursing Facility                                      Prior Living Arrangements/Services Living arrangements for the past 2 months: Skilled Nursing Facility Lives with:: Self Patient  language and need for interpreter reviewed:: Yes Do you feel safe going back to the place where you live?: Yes      Need for Family Participation in Patient Care: Yes (Comment)     Criminal Activity/Legal Involvement Pertinent to Current Situation/Hospitalization: No - Comment as needed  Activities of Daily Living   ADL Screening (condition at time of admission) Independently performs ADLs?: No Does the patient have a NEW difficulty with bathing/dressing/toileting/self-feeding that is expected to last >3 days?: No Does the patient have a NEW difficulty with getting in/out of bed, walking, or climbing stairs that is expected to last >3 days?: No Does the patient have a NEW difficulty with communication that is expected to last >3 days?: No Is the patient deaf or have difficulty hearing?: No Does the patient have difficulty seeing, even when wearing glasses/contacts?: No Does the patient have difficulty concentrating, remembering, or making decisions?: No  Permission Sought/Granted Permission sought to share information with : Facility Medical sales representative, Family Supports Permission granted to share information with : Yes, Verbal Permission Granted  Share Information with NAME: Sherlynn Carbon  Permission granted to share info w AGENCY: [facility]  Permission granted to share info w Relationship: Sister  Permission granted to share info w Contact Information: [phone]  Emotional Assessment Appearance:: Appears stated age Attitude/Demeanor/Rapport: Engaged Affect (typically observed): Appropriate, Calm Orientation: : Oriented  to Self, Oriented to Place, Oriented to  Time, Oriented to Situation Alcohol / Substance Use: Not Applicable Psych Involvement: No (comment)  Admission diagnosis:  UTI (urinary tract infection) [N39.0] Severe sepsis with septic shock (HCC) [A41.9, R65.21] Severe sepsis (HCC) [A41.9, R65.20] Sepsis (HCC) [A41.9] Patient Active Problem List    Diagnosis Date Noted   Obesity (BMI 30-39.9) 03/07/2023   Myocardial injury 03/07/2023   Depression with anxiety 03/07/2023   Sepsis (HCC) 01/30/2023   Chronic obstructive pulmonary disease (COPD) (HCC) 01/30/2023   Recurrent UTI 01/30/2023   Otitis externa 01/30/2023   Hypotension 01/30/2023   Peripheral neuropathy 01/30/2023   Acute pharyngitis 01/30/2023   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   Severe protein-calorie malnutrition (HCC) 10/10/2022   History of pulmonary embolus 2020(PE) 04/07/2021   Acute diverticulitis 01/30/2021   Current chronic use of systemic steroids 01/30/2021   Chronic diastolic CHF (congestive heart failure) (HCC) 01/30/2021   Compression fracture of T2 vertebra (HCC) 01/30/2021   S/P PICC central line placement 01/30/2021   Sinus tachycardia 11/12/2020   COVID-19 virus infection 11/11/2020   Colostomy in place [facility]) 11/09/2020   AKI (acute kidney injury) (HCC) 11/09/2020   Severe sepsis with septic shock (HCC) 11/07/2020   Pain in joint, shoulder region 07/03/2020   Giant cell arteritis (HCC) 05/01/2020   History of renal cell carcinoma 04/25/2020   HFrEF (heart failure with reduced ejection fraction) (HCC) 03/22/2020   Frontal headache 05/30/2019   Carcinoma, renal cell, left (HCC) 11/14/2018   Pulmonary embolism (HCC) 07/13/2018   DDD (degenerative disc disease), lumbar 06/30/2018   Chronic low back pain 06/30/2018   Hip pain, bilateral 06/30/2018   Chronic, continuous use of opioids 06/30/2018   PVC (premature ventricular contraction) 06/04/2017   Chest pain 10/19/2016   Presence of right artificial knee joint 05/18/2016   Pes anserinus bursitis of left knee 05/18/2016   Unilateral primary osteoarthritis, right knee 12/13/2015   Hx of total knee arthroplasty 12/13/2015   Osteoarthritis of left knee 08/02/2015   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   Morbid obesity with BMI of 45.0-49.9, adult (HCC) 07/25/2015    Spinal stenosis of lumbar region 06/19/2015   Chronic pain syndrome 10/30/2014   PCP:  Oneita Hurt, No Pharmacy:   [facility] [address] [place] - [address] [place] [address] Phone: [phone] Fax: [phone]  [id number], [place] - [address] AT [facility] OF [address] [place] [address] Phone: [phone] Fax: [phone]     Social Drivers of Health (SDOH) Social History: SDOH Screenings   Food Insecurity: No Food Insecurity (03/08/2023)  Housing: Low Risk  (03/08/2023)  Transportation Needs: No Transportation Needs (03/08/2023)  Recent Concern: Transportation Needs - Unmet Transportation Needs (01/24/2023)   Received from [facility]  Utilities: Not At Risk (03/08/2023)  Depression (PHQ2-9): Low Risk  (07/01/2022)  Financial Resource Strain: Low Risk  (12/26/2020)   Received from [facility]  Social Connections: Unknown (03/08/2023)  Tobacco Use: High Risk (03/08/2023)   SDOH Interventions:     Readmission Risk Interventions    03/08/2023   11:09 AM 02/03/2021    9:12 AM  Readmission Risk Prevention Plan  Transportation Screening Complete Complete  Medication Review (RN Care Manager) Complete Complete  PCP or Specialist appointment within 3-5 days of discharge Complete Complete  HRI or Home Care Consult  Complete  SW Recovery Care/Counseling Consult Complete   Palliative Care Screening Not Applicable Not Applicable  Skilled Nursing Facility Complete Complete

## 2023-03-08 NOTE — Assessment & Plan Note (Signed)
 Patient received stress doses of Solu-Cortef Continue home Cortef

## 2023-03-08 NOTE — Progress Notes (Signed)
 ANTICOAGULATION CONSULT NOTE  Pharmacy Consult for heparin infusion Indication: NSTEMI  Allergies  Allergen Reactions   Hydrocodone-Acetaminophen Swelling    hands   Iodine Anaphylaxis and Swelling    Iv dye 02/03/21-Ispoke to patient and she had IV contrast  X4 last year and has had no problem. ( Last CT abdomen with contrast was on 01/02/21.-    Other Other (See Comments)    ALLERGY TO METAL - BLACKENS SKIN AND CAUSES A RASH    Tape Swelling and Other (See Comments)    Skin comes off.  Paper tape is ok tegaderm OK   Peanut Oil Other (See Comments)    ** Nuts cause runny nose   Shellfish Allergy Nausea And Vomiting and Swelling    Episode of GI infection after eating clam chowder. Still eats shrimp and other seafood    Patient Measurements: Height: 5\' 8"  (172.7 cm) Weight: 117.3 kg (258 lb 11.2 oz) IBW/kg (Calculated) : 63.9 Heparin Dosing Weight: 91.1 kg  Vital Signs: Temp: 97.6 F (36.4 C) (02/24 1955) Temp Source: Oral (02/24 1955) BP: 109/59 (02/24 1955) Pulse Rate: 83 (02/24 1955)  Labs: Recent Labs    03/07/23 2036 03/07/23 2251 03/08/23 0341 03/08/23 0515 03/08/23 1054 03/08/23 2139  HGB 8.6*  --   --  8.3*  --   --   HCT 26.5*  --   --  25.7*  --   --   PLT 585*  --   --  603*  --   --   APTT 57*  --   --   --  60* 127*  LABPROT 22.1*  --   --   --   --   --   INR 1.9*  --   --   --   --   --   HEPARINUNFRC  --   --   --   --  >1.10*  --   CREATININE 0.84  --  0.73  --   --   --   TROPONINIHS 217*   < > 150* 113* 61*  --    < > = values in this interval not displayed.    Estimated Creatinine Clearance: 86.9 mL/min (by C-G formula based on SCr of 0.73 mg/dL).   Medical History: Past Medical History:  Diagnosis Date   Allergic rhinitis    Arthritis    Asthma    problems with fumes and aerosols cause asthma   Chest pain 10/19/2016   CHF (congestive heart failure) (HCC)    Complication of anesthesia    awakens during surgery; has occurred with  last 3-4 surgeries    COPD (chronic obstructive pulmonary disease) (HCC)    DVT (deep venous thrombosis) (HCC) 07/13/2018   Right leg   Environmental allergies    fumes    Fibromyalgia    GERD (gastroesophageal reflux disease)    Headache    History of bronchitis    Hx of total knee arthroplasty 12/13/2015   Hyperlipidemia    Hypertension    Morbid obesity with BMI of 45.0-49.9, adult (HCC) 07/25/2015   NICM (nonischemic cardiomyopathy) (HCC)    a. ? PVC mediated;  b. 06/2016 Echo: EF 25-30%, mild LVH, mild LAE, mild AI/MR/TR/PR; c. 08/2016 Cath: nl cors, EF 25%.   Numbness    hands bilat when driving; improves when not preforming task    Osteoarthritis of left knee 08/02/2015   PE (pulmonary thromboembolism) (HCC) 07/13/2018   Pes anserinus bursitis of left knee 05/18/2016  Presence of right artificial knee joint 05/18/2016   PVC (premature ventricular contraction) 06/04/2017   PVC's (premature ventricular contractions)    a. 11/2016 Amio started;  b. 11/29/16 48h Holter: 57574 PVC's (41%); c. 12/2016 24h Holter: 18040 PVC's (15%); c. 02/2017 48h Holter: 37262 PVC's (41%).   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   Temporal arteritis (HCC) 05/01/2020   Tinnitus    comes and goes    Unilateral primary osteoarthritis, right knee 12/13/2015   Vertigo    none for over 2 yrs    Medications:  PTA Meds: Apixaban 5 mg BID, last dose 10/23 evening  Assessment: Pt is a 73 yo female with h/o PE on Eliquis presenting to ED with recurrent UTI, now found with NSTEMI, elevated troponin I level - trending up. Baseline INR elevated at 1.9  Goal of Therapy:  Heparin level 0.3-0.7 units/ml aPTT 66-102 seconds Monitor platelets by anticoagulation protocol: Yes  Baseline aPTT 57 & INR 1.9, both elevated   Monitoring: 2/24 @ 1054: HL >1.10, aPTT 60  (subtherapeutic, not correlating)  2/24 @ 2139:  aPTT = 127, SUPRAtherapeutic   Plan:  2/24:  aPTT @ 2139 = 127, SUPRAtherapeutic - will decrease heparin drip rate to 1250 units/hr ; pt was subtherapeutic on 1200 units/hr Check aPTT and HL 8 hours after rate change Continue to titrate heparin using aPTT until aPTT and HL are correlating Continue to monitor CBC daily while on heparin infusion.  Maylon Sailors D Clinical Pharmacist 03/08/2023 10:44 PM

## 2023-03-09 ENCOUNTER — Inpatient Hospital Stay (HOSPITAL_COMMUNITY)
Admit: 2023-03-09 | Discharge: 2023-03-09 | Disposition: A | Discharge status: Home / Self Care | Payer: Medicare Other | Attending: Internal Medicine

## 2023-03-09 DIAGNOSIS — R9431 Abnormal electrocardiogram [ECG] [EKG]: Secondary | ICD-10-CM

## 2023-03-09 DIAGNOSIS — A419 Sepsis, unspecified organism: Secondary | ICD-10-CM | POA: Diagnosis not present

## 2023-03-09 DIAGNOSIS — I5A Non-ischemic myocardial injury (non-traumatic): Secondary | ICD-10-CM | POA: Diagnosis not present

## 2023-03-09 DIAGNOSIS — N390 Urinary tract infection, site not specified: Secondary | ICD-10-CM | POA: Diagnosis not present

## 2023-03-09 DIAGNOSIS — E2749 Other adrenocortical insufficiency: Secondary | ICD-10-CM | POA: Diagnosis not present

## 2023-03-09 LAB — ECHOCARDIOGRAM COMPLETE
AR max vel: 2.19 cm2
AV Area VTI: 2.45 cm2
AV Area mean vel: 2.05 cm2
AV Mean grad: 10 mm[Hg]
AV Peak grad: 16.8 mm[Hg]
Ao pk vel: 2.05 m/s
Area-P 1/2: 5.2 cm2
Calc EF: 35.4 %
Height: 68 in
MV VTI: 2.45 cm2
S' Lateral: 5 cm
Single Plane A2C EF: 35.6 %
Single Plane A4C EF: 33.3 %
Weight: 4095.26 [oz_av]

## 2023-03-09 LAB — BASIC METABOLIC PANEL
Anion gap: 10 (ref 5–15)
BUN: 27 mg/dL — ABNORMAL HIGH (ref 8–23)
CO2: 25 mmol/L (ref 22–32)
Calcium: 7.4 mg/dL — ABNORMAL LOW (ref 8.9–10.3)
Chloride: 102 mmol/L (ref 98–111)
Creatinine, Ser: 0.79 mg/dL (ref 0.44–1.00)
GFR, Estimated: >60 mL/min (ref >60)
Glucose, Bld: 115 mg/dL — ABNORMAL HIGH (ref 70–99)
Potassium: 3.3 mmol/L — ABNORMAL LOW (ref 3.5–5.1)
Sodium: 137 mmol/L (ref 135–145)

## 2023-03-09 LAB — CBC
HCT: 25 % — ABNORMAL LOW (ref 36.0–46.0)
Hemoglobin: 8.4 g/dL — ABNORMAL LOW (ref 12.0–15.0)
MCH: 28 pg (ref 26.0–34.0)
MCHC: 33.6 g/dL (ref 30.0–36.0)
MCV: 83.3 fL (ref 80.0–100.0)
Platelets: 595 10*3/uL — ABNORMAL HIGH (ref 150–400)
RBC: 3 MIL/uL — ABNORMAL LOW (ref 3.87–5.11)
RDW: 16.2 % — ABNORMAL HIGH (ref 11.5–15.5)
WBC: 9.6 10*3/uL (ref 4.0–10.5)
nRBC: 0 % (ref 0.0–0.2)

## 2023-03-09 LAB — URINE CULTURE: Culture: NO GROWTH

## 2023-03-09 LAB — HEPARIN LEVEL (UNFRACTIONATED): Heparin Unfractionated: 0.73 [IU]/mL — ABNORMAL HIGH (ref 0.30–0.70)

## 2023-03-09 LAB — MAGNESIUM: Magnesium: 2 mg/dL (ref 1.7–2.4)

## 2023-03-09 LAB — APTT: aPTT: 78 s — ABNORMAL HIGH (ref 24–36)

## 2023-03-09 MED ORDER — HYDROCORTISONE 5 MG PO TABS: 5.0000 mg | ORAL_TABLET | Freq: Three times a day (TID) | ORAL | Status: DC

## 2023-03-09 MED ORDER — POTASSIUM CHLORIDE CRYS ER 20 MEQ PO TBCR
40.0000 meq | EXTENDED_RELEASE_TABLET | Freq: Once | ORAL | Status: DC
  Filled 2023-03-09: qty 2

## 2023-03-09 MED ORDER — BOOST PLUS PO LIQD
237.0000 mL | Freq: Three times a day (TID) | ORAL | Status: DC
  Administered 2023-03-09 – 2023-03-21 (×18): 237 mL via ORAL
  Filled 2023-03-09 (×2): qty 237

## 2023-03-09 MED ORDER — HYDROCORTISONE 5 MG PO TABS
5.0000 mg | ORAL_TABLET | Freq: Two times a day (BID) | ORAL | Status: DC
  Administered 2023-03-10 – 2023-03-27 (×30): 5 mg via ORAL
  Filled 2023-03-09 (×38): qty 1

## 2023-03-09 MED ORDER — TORSEMIDE 20 MG PO TABS
20.0000 mg | ORAL_TABLET | Freq: Every day | ORAL | Status: DC
  Administered 2023-03-09 – 2023-03-11 (×3): 20 mg via ORAL
  Filled 2023-03-09 (×3): qty 1

## 2023-03-09 MED ORDER — METOPROLOL TARTRATE 25 MG PO TABS
25.0000 mg | ORAL_TABLET | Freq: Two times a day (BID) | ORAL | Status: DC
  Administered 2023-03-09 – 2023-03-11 (×5): 25 mg via ORAL
  Filled 2023-03-09 (×6): qty 1

## 2023-03-09 MED ORDER — HYDROCORTISONE 5 MG PO TABS
15.0000 mg | ORAL_TABLET | Freq: Every day | ORAL | Status: DC
  Administered 2023-03-10 – 2023-03-27 (×16): 15 mg via ORAL
  Filled 2023-03-09 (×18): qty 1

## 2023-03-09 MED ORDER — HYDROCORTISONE SOD SUC (PF) 250 MG IJ SOLR
100.0000 mg | Freq: Two times a day (BID) | INTRAMUSCULAR | Status: AC
  Administered 2023-03-09 (×2): 100 mg via INTRAVENOUS
  Filled 2023-03-09 (×2): qty 100

## 2023-03-09 MED ORDER — APIXABAN 5 MG PO TABS
5.0000 mg | ORAL_TABLET | Freq: Two times a day (BID) | ORAL | Status: DC
  Administered 2023-03-09 – 2023-03-15 (×12): 5 mg via ORAL
  Filled 2023-03-09 (×12): qty 1

## 2023-03-09 MED ORDER — MIDODRINE HCL 5 MG PO TABS
15.0000 mg | ORAL_TABLET | Freq: Three times a day (TID) | ORAL | Status: DC
  Administered 2023-03-09 – 2023-03-27 (×52): 15 mg via ORAL
  Filled 2023-03-09 (×53): qty 3

## 2023-03-09 MED ORDER — POTASSIUM CHLORIDE CRYS ER 20 MEQ PO TBCR
40.0000 meq | EXTENDED_RELEASE_TABLET | Freq: Once | ORAL | Status: AC
  Administered 2023-03-09: 40 meq via ORAL
  Filled 2023-03-09: qty 2

## 2023-03-09 NOTE — Assessment & Plan Note (Addendum)
 Continue home Eliquis

## 2023-03-09 NOTE — TOC Progression Note (Signed)
 Transition of Care [facility]) - Progression Note    Patient Details  Name: Wendy Sanchez MRN: [id number] Date of Birth: 07-09-1950  [facility]) CM/SW Contact  Truddie Hidden, RN Phone Number: 03/09/2023, 11:32 AM  Clinical Narrative:    Spoke with Dabe, in admission, from [facility] about patient's return to facility and if payment would be expected. He will check with business office.    Expected Discharge Plan: Skilled Nursing Facility Barriers to Discharge: Continued Medical Work up  Expected Discharge Plan and Services     Post Acute Care Choice: Skilled Nursing Facility Living arrangements for the past 2 months: Skilled Nursing Facility                                       Social Determinants of Health (SDOH) Interventions SDOH Screenings   Food Insecurity: No Food Insecurity (03/08/2023)  Housing: Low Risk  (03/08/2023)  Transportation Needs: No Transportation Needs (03/08/2023)  Recent Concern: Transportation Needs - Unmet Transportation Needs (01/24/2023)   Received from [facility]  Utilities: Not At Risk (03/08/2023)  Depression (PHQ2-9): Low Risk  (07/01/2022)  Financial Resource Strain: Low Risk  (12/26/2020)   Received from [facility]  Social Connections: Unknown (03/08/2023)  Tobacco Use: High Risk (03/08/2023)    Readmission Risk Interventions    03/08/2023   11:09 AM 02/03/2021    9:12 AM  Readmission Risk Prevention Plan  Transportation Screening Complete Complete  Medication Review (RN Care Manager) Complete Complete  PCP or Specialist appointment within 3-5 days of discharge Complete Complete  HRI or Home Care Consult  Complete  SW Recovery Care/Counseling Consult Complete   Palliative Care Screening Not Applicable Not Applicable  Skilled Nursing Facility Complete Complete

## 2023-03-09 NOTE — Plan of Care (Signed)
  Problem: Education: Goal: Knowledge of General Education information will improve Description: Including pain rating scale, medication(s)/side effects and non-pharmacologic comfort measures Outcome: Progressing   Problem: Health Behavior/Discharge Planning: Goal: Ability to manage health-related needs will improve Outcome: Progressing   Problem: Clinical Measurements: Goal: Respiratory complications will improve Outcome: Progressing   Problem: Clinical Measurements: Goal: Cardiovascular complication will be avoided Outcome: Progressing   Problem: Elimination: Goal: Will not experience complications related to bowel motility Outcome: Progressing   Problem: Elimination: Goal: Will not experience complications related to urinary retention Outcome: Progressing   Problem: Pain Managment: Goal: General experience of comfort will improve and/or be controlled Outcome: Progressing   Problem: Safety: Goal: Ability to remain free from injury will improve Outcome: Progressing

## 2023-03-09 NOTE — Evaluation (Addendum)
 Occupational Therapy Evaluation Patient Details Name: Wendy Sanchez MRN: [id number] DOB: [birth year] Today's Date: 03/09/2023   History of Present Illness   Pt is a 73 y.o. female admitted with sepsis, adrenal insufficiency, NSTEMI and recurrent UTI. PMH significant for COPD, T2 compression fx, OA, CHF with EF of 40-45%, depression, obesity, anemia, DVT and PE on Eliquis, temporal arteritis, renal cell carcinoma, adrenal insufficiency, s/p for colostomy secondary to perforated diverticulitis.     Clinical Impressions PTA, pt recently receiving rehab at [facility] and has multiple hospital admissions recently. At SNF, pt requires physical assist for ADLs, +2 for mobility (was walking up to 20 ft with RW and wheelchair follow). Per pt, she lives with her son and has required +2 assist for the past 8 months to perform stand > pivot transfers at home. Has a hospital bed and hoyer lift, but does not use lift as it does not fit under her bed. Vitals assessed prior to mobility (BP 119/76 (89), HR 89). On OT eval, pt performing bed mobility and 2 attempts at sit<>stand transfers with maxA +2 using RW (bilateral foot block and heavy support on RW for steadying). Session limited by HR elevated and therapy team notified by RN.  Pt motivated, and wants to increase her strength, balance and endurance for independence. BUE grossly 3/5 MMT; pt states chronic R rotator cuff injury causing limited ROM and pain but is able to reach back of head to simulate hair care. Anticipate MIN A for UB ADLs, MAX A bed level for LB ADLs, will trial STEDY lift next session for further assessment of functional transfers. Pt would benefit from skilled OT services to address noted impairments and functional limitations (see below for any additional details) in order to maximize safety and independence while minimizing falls risk and caregiver burden. Patient will benefit from continued inpatient follow up therapy, <3 hours/day       If plan is discharge home, recommend the following:   Two people to help with walking and/or transfers;Two people to help with bathing/dressing/bathroom;Assistance with cooking/housework     Functional Status Assessment   Patient has had a recent decline in their functional status and demonstrates the ability to make significant improvements in function in a reasonable and predictable amount of time.     Equipment Recommendations   None recommended by OT (defer to next LOC)      Precautions/Restrictions   Precautions Precautions: Fall Recall of Precautions/Restrictions: Intact Restrictions Weight Bearing Restrictions Per Provider Order: No     Mobility Bed Mobility Overal bed mobility: Needs Assistance Bed Mobility: Supine to Sit, Sit to Supine     Supine to sit: Max assist, +2 for physical assistance Sit to supine: Total assist, +2 for physical assistance   General bed mobility comments: Pt self initates bed mobility, requires heavy assist due to weakness and body habitus, vitals assed prior to performing mobility WNL    Transfers Overall transfer level: Needs assistance Equipment used: Rolling walker (2 wheels) Transfers: Sit to/from Stand Sit to Stand: Max assist, +2 physical assistance           General transfer comment: 2x trials of sit<>stand, first attempt able to achieve standing with both feet blocked, therapist supporting RW and heavy +2 to clear buttocks. pt holding breath with standing attempts, barely able to clear buttocks on second attempts. RN in to address elevated HR - up to 170 with exertion, session terminated with returning pt back to bed  Balance Overall balance assessment: Needs assistance Sitting-balance support: Feet supported, Bilateral upper extremity supported Sitting balance-Leahy Scale: Good     Standing balance support: Bilateral upper extremity supported, Reliant on assistive device for balance Standing balance-Leahy  Scale: Zero                             ADL either performed or assessed with clinical judgement   ADL Overall ADL's : Needs assistance/impaired Eating/Feeding: Set up;Sitting Eating/Feeding Details (indicate cue type and reason): pt observed eating lunch Grooming: Set up;Sitting   Upper Body Bathing: Minimal assistance;Sitting Upper Body Bathing Details (indicate cue type and reason): anticipate minA based on body habitus and limited ROM BUE Lower Body Bathing: Maximal assistance;Bed level Lower Body Bathing Details (indicate cue type and reason): pt does not have standing tolerance to perform STS; will need seated lateral leans or bed level for LB bathing Upper Body Dressing : Minimal assistance;Sitting   Lower Body Dressing: Maximal assistance;Bed level   Toilet Transfer: BSC/3in1;+2 for physical assistance;+2 for safety/equipment Toilet Transfer Details (indicate cue type and reason): potentially STEDY for BSC transfer, if unable to use STEDY, bed level for toileting Toileting- Clothing Manipulation and Hygiene: Maximal assistance;+2 for physical assistance;Sitting/lateral lean       Functional mobility during ADLs: +2 for physical assistance;Maximal assistance;Rolling walker (2 wheels)       Vision Baseline Vision/History: 1 Wears glasses Ability to See in Adequate Light: 1 Impaired Patient Visual Report:  (pt states 13 mo from last eye doctor visit) Vision Assessment?: Wears glasses for reading            Pertinent Vitals/Pain Pain Assessment Pain Assessment: Faces Faces Pain Scale: Hurts little more Pain Location: R UE with active movement Pain Descriptors / Indicators: Aching Pain Intervention(s): Limited activity within patient's tolerance     Extremity/Trunk Assessment Upper Extremity Assessment Upper Extremity Assessment: Generalized weakness (grossly 3/5 MMT BUE)   Lower Extremity Assessment Lower Extremity Assessment: Defer to PT evaluation        Communication Communication Communication: No apparent difficulties   Cognition Arousal: Alert Behavior During Therapy: WFL for tasks assessed/performed                                 Following commands: Intact       Cueing  General Comments   Cueing Techniques: Verbal cues              Home Living Family/patient expects to be discharged to:: Private residence Living Arrangements: Children Available Help at Discharge: Family;Available PRN/intermittently Type of Home: House Home Access: Ramped entrance     Home Layout: One level     Bathroom Shower/Tub: Tub/shower unit         Home Equipment: BSC/3in1;Hospital bed   Additional Comments: Pt recently at [facility] for rehab stay      Prior Functioning/Environment Prior Level of Function : Needs assist             Mobility Comments: has begun walking with therapy up to 20' with RW and WC follow. Still requires +2 assist at SNF ADLs Comments: needs assistance; painful Rt shoulder s/p rotator cuff injury    OT Problem List: Decreased strength;Decreased activity tolerance;Decreased range of motion;Impaired balance (sitting and/or standing);Decreased knowledge of use of DME or AE;Cardiopulmonary status limiting activity;Obesity   OT Treatment/Interventions: Self-care/ADL training;Therapeutic exercise;Neuromuscular education;Energy conservation;DME and/or  AE instruction;Therapeutic activities;Patient/family education;Balance training      OT Goals(Current goals can be found in the care plan section)   Acute Rehab OT Goals OT Goal Formulation: With patient Time For Goal Achievement: 03/23/23 Potential to Achieve Goals: Fair   OT Frequency:  Min 1X/week    Co-evaluation PT/OT/SLP Co-Evaluation/Treatment: Yes Reason for Co-Treatment: Complexity of the patient's impairments (multi-system involvement);For patient/therapist safety;To address functional/ADL transfers PT goals  addressed during session: Mobility/safety with mobility OT goals addressed during session: ADL's and self-care      AM-PAC OT "6 Clicks" Daily Activity     Outcome Measure Help from another person eating meals?: None Help from another person taking care of personal grooming?: A Little Help from another person toileting, which includes using toliet, bedpan, or urinal?: Total Help from another person bathing (including washing, rinsing, drying)?: Total Help from another person to put on and taking off regular upper body clothing?: A Little Help from another person to put on and taking off regular lower body clothing?: Total 6 Click Score: 13   End of Session Equipment Utilized During Treatment: Rolling walker (2 wheels);Gait belt Nurse Communication: Mobility status;Need for lift equipment  Activity Tolerance: Treatment limited secondary to medical complications (Comment) (RN in to address elevated HR) Patient left: in chair;with nursing/sitter in room;with bed alarm set;with call bell/phone within reach  OT Visit Diagnosis: Muscle weakness (generalized) (M62.81);Other abnormalities of gait and mobility (R26.89);Unsteadiness on feet (R26.81)                Time: 1005-1037 OT Time Calculation (min): 32 min Charges:  OT General Charges $OT Visit: 1 Visit OT Evaluation $OT Eval Moderate Complexity: 1 Mod  Stacia Feazell L. Donnie Gedeon, OTR/L  03/09/23, 1:25 PM

## 2023-03-09 NOTE — Assessment & Plan Note (Addendum)
 Blood pressure currently borderline soft Patient received stress doses of Solu-Cortef, and has been continued on home doses of hydrocortisone.

## 2023-03-09 NOTE — Plan of Care (Signed)

## 2023-03-09 NOTE — Assessment & Plan Note (Signed)
 Severe sepsis with concern of septic shock Recurrent UTI Patient met sepsis criteria with tachycardia and tachypnea, no leukocytosis on admission but did had significant leukocytosis today which can be due to stress doses of steroid.  History of recurrent UTI which has been worked out as outpatient and according to care everywhere her urologist recommend continuation of low-dose antibiotics.   Lactic acidosis resolved Blood and urine cultures remain negative. No obvious source of infection-sepsis ruled out -Discontinuing antibiotics

## 2023-03-09 NOTE — Progress Notes (Signed)
 Progress Note   Patient: Wendy Sanchez:[id number] DOB: 1950/02/23 DOA: 03/07/2023     2 DOS: the patient was seen and examined on 03/09/2023   Brief hospital course: Taken from H&P  Wendy Sanchez is a 73 y.o. female with medical history significant of COPD, CHF with EF of 40-45%, depression, obesity, anemia, DVT and PE on Eliquis, temporal arteritis, renal cell carcinoma, adrenal insufficiency, s/p for colostomy secondary to perforated diverticulitis, who presents with fever, increased urinary frequency.   Patient was found to have hypotension with blood pressure 86/59, which improved to 101/70 after giving 2 L LR bolus in ED, but blood pressure has dropped again to 82/61 with MAP of 69 when I saw pt in ED. Her mental status is normal.  pt was found to have WBC 10.3, GFR> 60, negative PCR for COVID, flu and RSV, UA (hazy appearance, small amount of leukocyte, rare bacteria, WBC 11-20), lactic acid of 1.7 --> 2.2 --> 1.5, troponin 217 --> 251.  Chest x-ray negative for infiltration.  EKG: Sinus rhythm, QTc 484, LAE, LAD, poor R wave progression, low voltage.   2/24: Blood pressure with some improvement to 97/63, patient was started on midodrine and stress doses of steroid.  Elevated leukocytosis today likely secondary to stress doses of steroid.  Preliminary blood cultures negative.  Urine cultures pending.  Troponin peaked at 251 and now trending down.  Family is very concerned about this recurrent UTI, patient has multiple hospitalization for this and has been seen outpatient urology, there were recommending continuing antibiotics at suppressive doses.  2/25: Vital stable.  Blood pressure seems within goal now.  Mild hypokalemia which are being repleted.  Blood cultures remain negative, urine cultures with no growth.  Stopping antibiotics.  Increasing the dose of midodrine. Had an episode of sinus tachycardia, restarting home metoprolol and torsemide.  PT is recommending SNF, not sure if  she has any days left  Assessment and Plan: * Sepsis (HCC) Severe sepsis with concern of septic shock Recurrent UTI Patient met sepsis criteria with tachycardia and tachypnea, no leukocytosis on admission but did had significant leukocytosis today which can be due to stress doses of steroid.  History of recurrent UTI which has been worked out as outpatient and according to care everywhere her urologist recommend continuation of low-dose antibiotics.   Lactic acidosis resolved Blood and urine cultures remain negative. No obvious source of infection-sepsis ruled out -Discontinuing antibiotics  Secondary adrenal insufficiency (HCC) Patient received stress doses of Solu-Cortef Continue home Cortef Increasing the dose of midodrine  Myocardial injury Troponin peaked at 251 and then started trending down.  No chest pain.  Likely demand ischemia with concern of sepsis. -Echocardiogram ordered-still pending -Continue aspirin 81 mg daily -Stop heparin infusion as received for 2 days  Chronic obstructive pulmonary disease (COPD) (HCC) No concern of exacerbation. -Continue home bronchodilators  Chronic pain syndrome -As needed Tylenol and oxycodone  HFrEF (heart failure with reduced ejection fraction) (HCC)  2D echo on 10/07/2022 showed EF of 40-45%.  Patient has 1+ leg edema, but no worsening shortness breath, does not seem to have CHF exacerbation.  BNP of 193 -Hold diuretics due to hypotension -Monitor volume status closely -Repeat echocardiogram ordered  History of pulmonary embolus 2020(PE) Restarting home Eliquis  Depression with anxiety -Continue home meds  Obesity (BMI 30-39.9) Estimated body mass index is 39.34 kg/m as calculated from the following:   Height as of this encounter: 5\' 8"  (1.727 m).   Weight as of  this encounter: 117.3 kg.   -This will complicate overall prognosis -Encouraged weight loss  Pressure injury of skin Pressure Injury 03/08/23 Ischial tuberosity  Left Stage 2 -  Partial thickness loss of dermis presenting as a shallow open injury with a red, pink wound bed without slough. (Active)  03/08/23 1100  Location: Ischial tuberosity  Location Orientation: Left  Staging: Stage 2 -  Partial thickness loss of dermis presenting as a shallow open injury with a red, pink wound bed without slough.  Wound Description (Comments):   Present on Admission: Yes     Pressure Injury 03/08/23 Ischial tuberosity Right Stage 2 -  Partial thickness loss of dermis presenting as a shallow open injury with a red, pink wound bed without slough. (Active)  03/08/23 1100  Location: Ischial tuberosity  Location Orientation: Right  Staging: Stage 2 -  Partial thickness loss of dermis presenting as a shallow open injury with a red, pink wound bed without slough.  Wound Description (Comments):   Present on Admission: Yes   Present on admission     Subjective: General.  Obese lady, in no acute distress. Pulmonary.  Lungs clear bilaterally, normal respiratory effort. CV.  Regular rate and rhythm, no JVD, rub or murmur. Abdomen.  Soft, nontender, nondistended, BS positive. CNS.  Alert and oriented .  No focal neurologic deficit. Extremities.  1+ LE edema, no cyanosis, pulses intact and symmetrical.  Physical Exam: Vitals:   03/09/23 0517 03/09/23 0805 03/09/23 1040 03/09/23 1219  BP:  (!) 97/55 111/78 102/62  Pulse:  78  72  Resp:  18    Temp:  (!) 97.5 F (36.4 C)  97.9 F (36.6 C)  TempSrc:  Oral  Oral  SpO2:  100%  100%  Weight: 116.1 kg     Height:       General.  Obese lady, in no acute distress. Pulmonary.  Lungs clear bilaterally, normal respiratory effort. CV.  Regular rate and rhythm, no JVD, rub or murmur. Abdomen.  Soft, nontender, nondistended, BS positive. CNS.  Alert and oriented .  No focal neurologic deficit. Extremities.  1+ LE edema, no cyanosis, pulses intact and symmetrical.  Data Reviewed: Prior data reviewed  Family  Communication: Discussed with son on phone.  Disposition: Status is: Inpatient Remains inpatient appropriate because: Severity of illness  Planned Discharge Destination: Skilled nursing facility  Time spent: 50 minutes  This record has been created using Conservation officer, historic buildings. Errors have been sought and corrected,but may not always be located. Such creation errors do not reflect on the standard of care.   Author: Arnetha Courser, MD 03/09/2023 2:59 PM  For on call review [email or web address].

## 2023-03-09 NOTE — Assessment & Plan Note (Addendum)
 Troponin peaked at 251 and then started trending down.  No chest pain.  Likely demand ischemia with concern of sepsis. -Echocardiogram EF of 35 to 40%, severe LV dilation,mild LVH, normal RV, mild MR, mild aortic stenosis - new finding.  Cardiac catheterization with concern of nonischemic cardiomyopathy -Continue aspirin 81 mg daily -Completed 48 hours of heparin infusion.

## 2023-03-09 NOTE — Evaluation (Signed)
 Physical Therapy Evaluation Patient Details Name: DELBERTA FOLTS MRN: [id number] DOB: 13-Jun-1950 Today's Date: 03/09/2023  History of Present Illness  Pt admitted for complaints of fever and urine frequency. Pt diagnosed with sepsis. History includes COPD, CHF, Depression, obesity, and renal cell carcinoma.  Clinical Impression  PT/OT co-evaluation performed this date. Pt is a pleasant 73 year old female who was admitted for sepsis. Pt reports feeling frustrated secondary to multiple hospital admissions recently. Pt performs bed mobility/transfers with max assist +2 using RW. Further session ceased when HR elevated and team notified by RN. Vitals assessed prior to mobility and WNL. Pt demonstrates deficits with strength/mobility/balance. Pt is very motivated to participate. May be appropriate for RN led mobility with use of STEDY for safety with vitals monitored. Would benefit from skilled PT to address above deficits and promote optimal return to PLOF. Pt will continue to receive skilled PT services while admitted and will defer to TOC/care team for updates regarding disposition planning.         If plan is discharge home, recommend the following: Two people to help with walking and/or transfers;A lot of help with bathing/dressing/bathroom;Help with stairs or ramp for entrance;Assist for transportation   Can travel by private vehicle   No    Equipment Recommendations  (TBD)  Recommendations for Other Services       Functional Status Assessment Patient has had a recent decline in their functional status and demonstrates the ability to make significant improvements in function in a reasonable and predictable amount of time.     Precautions / Restrictions Precautions Precautions: Fall Recall of Precautions/Restrictions: Intact Restrictions Weight Bearing Restrictions Per Provider Order: No      Mobility  Bed Mobility Overal bed mobility: Needs Assistance Bed Mobility: Supine to  Sit, Sit to Supine     Supine to sit: Max assist, +2 for physical assistance Sit to supine: Total assist, +2 for physical assistance   General bed mobility comments: heavy assist provided for transition. Pt was able to self initiate mobility. Once seated, needs max assist for scooting out towards EOB. Vitals assessed prior to mobility. Once seated, able to sit with supervision    Transfers Overall transfer level: Needs assistance Equipment used: Rolling walker (2 wheels) Transfers: Sit to/from Stand Sit to Stand: Max assist, +2 physical assistance           General transfer comment: 2 trials for sit<>stand with first attempt able to achieve standing, however unable to maintain with poor balance. B foot blocking required and heavy +2 assist. Noted pt holding breath with exertion. On 2nd attempt, barely able to acheive buttock lift off. Prior to further attempts- RN in to address elevated HR and session haulted as HR was extremely elevated to 170s bpm    Ambulation/Gait               General Gait Details: unable  Stairs            Wheelchair Mobility     Tilt Bed    Modified Rankin (Stroke Patients Only)       Balance Overall balance assessment: Needs assistance Sitting-balance support: Feet supported, Bilateral upper extremity supported Sitting balance-Leahy Scale: Good                                       Pertinent Vitals/Pain Pain Assessment Pain Assessment: Faces Faces Pain Scale: Hurts little more  Pain Location: R UE with active movement Pain Descriptors / Indicators: Aching Pain Intervention(s): Limited activity within patient's tolerance, Repositioned    Home Living Family/patient expects to be discharged to:: Private residence Living Arrangements: Children (lives with son) Available Help at Discharge: Family;Available PRN/intermittently Type of Home: House Home Access: Ramped entrance       Home Layout: One level Home  Equipment: BSC/3in1;Hospital bed Additional Comments: home set up taken from previous chart review. Pt has recently been receiving rehab at [facility] for PT/OT. Plans to return after this hospital stay    Prior Function Prior Level of Function : Needs assist             Mobility Comments: has begun walking with therapy up to 20' with RW and WC follow. Still requires +2 assist at SNF ADLs Comments: needs assistance; painful Rt shoulder s/p rotator cuff injury     Extremity/Trunk Assessment   Upper Extremity Assessment Upper Extremity Assessment: Defer to OT evaluation    Lower Extremity Assessment Lower Extremity Assessment: Generalized weakness (B LE grossly 3/5 with quick fatigue)       Communication   Communication Communication: No apparent difficulties    Cognition Arousal: Alert Behavior During Therapy: WFL for tasks assessed/performed   PT - Cognitive impairments: No apparent impairments                       PT - Cognition Comments: pleasant and agreeable to session Following commands: Intact       Cueing Cueing Techniques: Verbal cues     General Comments      Exercises     Assessment/Plan    PT Assessment Patient needs continued PT services  PT Problem List Decreased strength;Decreased activity tolerance;Decreased balance;Decreased mobility;Obesity;Cardiopulmonary status limiting activity       PT Treatment Interventions DME instruction;Gait training;Therapeutic exercise;Balance training    PT Goals (Current goals can be found in the Care Plan section)  Acute Rehab PT Goals Patient Stated Goal: to go back to SNF PT Goal Formulation: With patient Time For Goal Achievement: 03/23/23 Potential to Achieve Goals: Good    Frequency Min 1X/week     Co-evaluation PT/OT/SLP Co-Evaluation/Treatment: Yes Reason for Co-Treatment: Complexity of the patient's impairments (multi-system involvement);For patient/therapist safety;To address  functional/ADL transfers PT goals addressed during session: Mobility/safety with mobility OT goals addressed during session: ADL's and self-care       AM-PAC PT "6 Clicks" Mobility  Outcome Measure Help needed turning from your back to your side while in a flat bed without using bedrails?: A Little Help needed moving from lying on your back to sitting on the side of a flat bed without using bedrails?: A Lot Help needed moving to and from a bed to a chair (including a wheelchair)?: Total Help needed standing up from a chair using your arms (e.g., wheelchair or bedside chair)?: A Lot Help needed to walk in hospital room?: Total Help needed climbing 3-5 steps with a railing? : Total 6 Click Score: 10    End of Session Equipment Utilized During Treatment: Gait belt Activity Tolerance: Treatment limited secondary to medical complications (Comment) Patient left: in bed;with bed alarm set (left with RN to perform another set of vitals) Nurse Communication: Mobility status PT Visit Diagnosis: Muscle weakness (generalized) (M62.81);Unsteadiness on feet (R26.81);Difficulty in walking, not elsewhere classified (R26.2)    Time: 9528-4132 PT Time Calculation (min) (ACUTE ONLY): 32 min   Charges:   PT Evaluation $PT Eval  Moderate Complexity: 1 Mod   PT General Charges $$ ACUTE PT VISIT: 1 Visit         Elizabeth Palau, PT, DPT, GCS [phone]   Jarielys Girardot 03/09/2023, 12:50 PM

## 2023-03-09 NOTE — Progress Notes (Signed)
*  PRELIMINARY RESULTS* Echocardiogram 2D Echocardiogram has been performed.  Carolyne Fiscal 03/09/2023, 11:03 AM

## 2023-03-09 NOTE — Progress Notes (Signed)
 ANTICOAGULATION CONSULT NOTE  Pharmacy Consult for heparin infusion Indication: NSTEMI  Patient Measurements: Height: 5\' 8"  (172.7 cm) Weight: 116.1 kg (255 lb 15.3 oz) IBW/kg (Calculated) : 63.9 Heparin Dosing Weight: 91.1 kg  Vital Signs: Temp: 97.6 F (36.4 C) (02/25 0418) Temp Source: Oral (02/24 2308) BP: 116/70 (02/25 0418) Pulse Rate: 79 (02/25 0418)  Labs: Recent Labs    03/07/23 2036 03/07/23 2251 03/08/23 0341 03/08/23 0515 03/08/23 1054 03/08/23 2139 03/09/23 0434 03/09/23 0652  HGB 8.6*  --   --  8.3*  --   --  8.4*  --   HCT 26.5*  --   --  25.7*  --   --  25.0*  --   PLT 585*  --   --  603*  --   --  595*  --   APTT 57*  --   --   --  60* 127*  --  78*  LABPROT 22.1*  --   --   --   --   --   --   --   INR 1.9*  --   --   --   --   --   --   --   HEPARINUNFRC  --   --   --   --  >1.10*  --   --  0.73*  CREATININE 0.84  --  0.73  --   --   --  0.79  --   TROPONINIHS 217*   < > 150* 113* 61*  --   --   --    < > = values in this interval not displayed.   Estimated Creatinine Clearance: 86.3 mL/min (by C-G formula based on SCr of 0.79 mg/dL).  Medications:  PTA Meds: Apixaban 5 mg BID, last dose 10/23 evening  Assessment: Pt is a 73 yo female with h/o PE on Eliquis presenting to ED with recurrent UTI, now found with NSTEMI, elevated troponin I level - trending up. Pharmacy consulted to initiate and manage heparin infusion.  Baseline aPTT 57 & INR 1.9, both elevated   Goal of Therapy:  Heparin level 0.3-0.7 units/ml aPTT 66-102 seconds Monitor platelets by anticoagulation protocol: Yes   Relevant results: Date Time  Results Comments  2/24 1054 HL > 1.10, aPTT 60 subtherapeutic, not correlating  2/24 2139 aPTT = 127 SUPRAtherapeutic, rate 1350 u/hr-->1250 u/hr  2/25 0652 HL=0.73, aPTT=78 Therapeutic, not correlating, rate 1250 un/h             Plan:  aPTT therapeutic but not yet correlating with HL. Will continue current heparin infusion rate  at 1250 units per hour. Noted Hgb and plt stable as well. Check aPTT in 8 hours to confirm therapeutic rate. Continue to titrate heparin using aPTT until aPTT and HL are correlating Continue to monitor CBC daily while on heparin infusion.  Emalyn Schou Rodriguez-Guzman PharmD, BCPS 03/09/2023 7:52 AM

## 2023-03-10 DIAGNOSIS — A419 Sepsis, unspecified organism: Secondary | ICD-10-CM | POA: Diagnosis not present

## 2023-03-10 DIAGNOSIS — G894 Chronic pain syndrome: Secondary | ICD-10-CM | POA: Diagnosis not present

## 2023-03-10 DIAGNOSIS — I5020 Unspecified systolic (congestive) heart failure: Secondary | ICD-10-CM | POA: Diagnosis not present

## 2023-03-10 DIAGNOSIS — N390 Urinary tract infection, site not specified: Secondary | ICD-10-CM | POA: Diagnosis not present

## 2023-03-10 LAB — CBC
HCT: 24.2 % — ABNORMAL LOW (ref 36.0–46.0)
Hemoglobin: 8.1 g/dL — ABNORMAL LOW (ref 12.0–15.0)
MCH: 27.9 pg (ref 26.0–34.0)
MCHC: 33.5 g/dL (ref 30.0–36.0)
MCV: 83.4 fL (ref 80.0–100.0)
Platelets: 574 10*3/uL — ABNORMAL HIGH (ref 150–400)
RBC: 2.9 MIL/uL — ABNORMAL LOW (ref 3.87–5.11)
RDW: 16.8 % — ABNORMAL HIGH (ref 11.5–15.5)
WBC: 7.7 10*3/uL (ref 4.0–10.5)
nRBC: 0.3 % — ABNORMAL HIGH (ref 0.0–0.2)

## 2023-03-10 LAB — BASIC METABOLIC PANEL
Anion gap: 7 (ref 5–15)
BUN: 26 mg/dL — ABNORMAL HIGH (ref 8–23)
CO2: 26 mmol/L (ref 22–32)
Calcium: 7.3 mg/dL — ABNORMAL LOW (ref 8.9–10.3)
Chloride: 103 mmol/L (ref 98–111)
Creatinine, Ser: 0.68 mg/dL (ref 0.44–1.00)
GFR, Estimated: >60 mL/min (ref >60)
Glucose, Bld: 102 mg/dL — ABNORMAL HIGH (ref 70–99)
Potassium: 3.6 mmol/L (ref 3.5–5.1)
Sodium: 136 mmol/L (ref 135–145)

## 2023-03-10 NOTE — Progress Notes (Signed)
 Progress Note   Patient: Wendy Sanchez XBJ:[id number] DOB: [birth year] DOA: 03/07/2023     3 DOS: the patient was seen and examined on 03/10/2023   Brief hospital course: Taken from H&P  ANAHIS FURGESON is a 73 y.o. female with medical history significant of COPD, CHF with EF of 40-45%, depression, obesity, anemia, DVT and PE on Eliquis, temporal arteritis, renal cell carcinoma, adrenal insufficiency, s/p for colostomy secondary to perforated diverticulitis, who presents with fever, increased urinary frequency.   Patient was found to have hypotension with blood pressure 86/59, which improved to 101/70 after giving 2 L LR bolus in ED, but blood pressure has dropped again to 82/61 with MAP of 69 when I saw pt in ED. Her mental status is normal.  pt was found to have WBC 10.3, GFR> 60, negative PCR for COVID, flu and RSV, UA (hazy appearance, small amount of leukocyte, rare bacteria, WBC 11-20), lactic acid of 1.7 --> 2.2 --> 1.5, troponin 217 --> 251.  Chest x-ray negative for infiltration.  EKG: Sinus rhythm, QTc 484, LAE, LAD, poor R wave progression, low voltage.   2/24: Blood pressure with some improvement to 97/63, patient was started on midodrine and stress doses of steroid.  Elevated leukocytosis today likely secondary to stress doses of steroid.  Preliminary blood cultures negative.  Urine cultures pending.  Troponin peaked at 251 and now trending down.  Family is very concerned about this recurrent UTI, patient has multiple hospitalization for this and has been seen outpatient urology, there were recommending continuing antibiotics at suppressive doses.  2/25: Vital stable.  Blood pressure seems within goal now.  Mild hypokalemia which are being repleted.  Blood cultures remain negative, urine cultures with no growth.  Stopping antibiotics.  Sepsis ruled out.  Increasing the dose of midodrine. Had an episode of sinus tachycardia, restarting home metoprolol and torsemide.  PT is recommending  SNF, not sure if she has any days left  2/26: Vital stable with blood pressure in low 100s.  Echo with EF of 35 to 40%, left ventricular global hypokinesis and severely dilated cavity, grade 1 diastolic dysfunction.  Small pericardial effusion. Patient currently stable to go back to her facility, she needs another insurance authorization which was started today.  She will go back once insurance authorization got approval.  Patient is high risk for readmission and mortality based on underlying comorbidities and poor functional status.  Palliative care was also consulted.  Assessment and Plan: * Sepsis (HCC) Severe sepsis with concern of septic shock Recurrent UTI Patient met sepsis criteria with tachycardia and tachypnea, no leukocytosis on admission but did had significant leukocytosis today which can be due to stress doses of steroid.  History of recurrent UTI which has been worked out as outpatient and according to care everywhere her urologist recommend continuation of low-dose antibiotics.   Lactic acidosis resolved Blood and urine cultures remain negative. No obvious source of infection-sepsis ruled out -Discontinuing antibiotics  Secondary adrenal insufficiency (HCC) Patient received stress doses of Solu-Cortef Continue home Cortef Increasing the dose of midodrine  Myocardial injury Troponin peaked at 251 and then started trending down.  No chest pain.  Likely demand ischemia with concern of sepsis. -Echocardiogram ordered-still pending -Continue aspirin 81 mg daily -Stop heparin infusion as received for 2 days  Chronic obstructive pulmonary disease (COPD) (HCC) No concern of exacerbation. -Continue home bronchodilators  Chronic pain syndrome -As needed Tylenol and oxycodone  HFrEF (heart failure with reduced ejection fraction) (HCC)  2D  echo on 10/07/2022 showed EF of 40-45%.  Patient has 1+ leg edema, but no worsening shortness breath, does not seem to have CHF  exacerbation.  BNP of 193 -Hold diuretics due to hypotension -Monitor volume status closely -Repeat echocardiogram ordered  History of pulmonary embolus 2020(PE) Restarting home Eliquis  Depression with anxiety -Continue home meds  Obesity (BMI 30-39.9) Estimated body mass index is 39.34 kg/m as calculated from the following:   Height as of this encounter: 5\' 8"  (1.727 m).   Weight as of this encounter: 117.3 kg.   -This will complicate overall prognosis -Encouraged weight loss  Pressure injury of skin Pressure Injury 03/08/23 Ischial tuberosity Left Stage 2 -  Partial thickness loss of dermis presenting as a shallow open injury with a red, pink wound bed without slough. (Active)  03/08/23 1100  Location: Ischial tuberosity  Location Orientation: Left  Staging: Stage 2 -  Partial thickness loss of dermis presenting as a shallow open injury with a red, pink wound bed without slough.  Wound Description (Comments):   Present on Admission: Yes     Pressure Injury 03/08/23 Ischial tuberosity Right Stage 2 -  Partial thickness loss of dermis presenting as a shallow open injury with a red, pink wound bed without slough. (Active)  03/08/23 1100  Location: Ischial tuberosity  Location Orientation: Right  Staging: Stage 2 -  Partial thickness loss of dermis presenting as a shallow open injury with a red, pink wound bed without slough.  Wound Description (Comments):   Present on Admission: Yes   Present on admission     Subjective: Patient was seen and examined today.  No new concern.  Physical Exam: Vitals:   03/10/23 0513 03/10/23 0550 03/10/23 0722 03/10/23 1205  BP: (!) 98/55  (!) 104/56 (!) 105/57  Pulse: 74  70 71  Resp: 18  18 18   Temp: (!) 97.5 F (36.4 C)  98.6 F (37 C) 98.6 F (37 C)  TempSrc: Oral     SpO2: 100%  100% 100%  Weight:  115.5 kg    Height:       General.  Frail elderly lady, in no acute distress. Pulmonary.  Lungs clear bilaterally, normal  respiratory effort. CV.  Regular rate and rhythm, no JVD, rub or murmur. Abdomen.  Soft, nontender, nondistended, BS positive. CNS.  Alert and oriented .  No focal neurologic deficit. Extremities.  1+ LE edema, no cyanosis, pulses intact and symmetrical.   Data Reviewed: Prior data reviewed  Family Communication: Talked with son on phone.  Disposition: Status is: Inpatient Remains inpatient appropriate because: Severity of illness  Planned Discharge Destination: Skilled nursing facility  Time spent: 45 minutes  This record has been created using Conservation officer, historic buildings. Errors have been sought and corrected,but may not always be located. Such creation errors do not reflect on the standard of care.   Author: Arnetha Courser, MD 03/10/2023 2:34 PM  For on call review [email or web address].

## 2023-03-10 NOTE — Care Management Important Message (Signed)
 Important Message  Patient Details  Name: Wendy Sanchez MRN: [id number] Date of Birth: 1950/07/30   Important Message Given:  Yes - Medicare IM     Cristela Blue, CMA 03/10/2023, 10:33 AM

## 2023-03-10 NOTE — TOC Progression Note (Addendum)
 Transition of Care [facility]) - Progression Note    Patient Details  Name: Wendy Sanchez MRN: [id number] Date of Birth: August 29, 1950  Transition of Care [facility]) CM/SW Contact  Margarito Liner, LCSW Phone Number: 03/10/2023, 10:01 AM  Clinical Narrative: Per [facility] admissions coordinator, patient will not have to pay any copays. They can accept her tomorrow if auth approved. CSW started insurance authorization.    11:28 am: Updated patient and sister.  Expected Discharge Plan: Skilled Nursing Facility Barriers to Discharge: Continued Medical Work up  Expected Discharge Plan and Services     Post Acute Care Choice: Skilled Nursing Facility Living arrangements for the past 2 months: Skilled Nursing Facility                                       Social Determinants of Health (SDOH) Interventions SDOH Screenings   Food Insecurity: No Food Insecurity (03/08/2023)  Housing: Low Risk  (03/08/2023)  Transportation Needs: No Transportation Needs (03/08/2023)  Recent Concern: Transportation Needs - Unmet Transportation Needs (01/24/2023)   [facility]  Utilities: Not At Risk (03/08/2023)  Depression (PHQ2-9): Low Risk  (07/01/2022)  Financial Resource Strain: Low Risk  (12/26/2020)   [facility]  Social Connections: Unknown (03/08/2023)  Tobacco Use: High Risk (03/08/2023)    Readmission Risk Interventions    03/08/2023   11:09 AM 02/03/2021    9:12 AM  Readmission Risk Prevention Plan  Transportation Screening Complete Complete  Medication Review (RN Care Manager) Complete Complete  PCP or Specialist appointment within 3-5 days of discharge Complete Complete  HRI or Home Care Consult  Complete  SW Recovery Care/Counseling Consult Complete   Palliative Care Screening Not Applicable Not Applicable  Skilled Nursing Facility Complete Complete

## 2023-03-10 NOTE — NC FL2 (Signed)
 [place] MEDICAID FL2 LEVEL OF CARE FORM     IDENTIFICATION  Patient Name: Wendy Sanchez Birthdate: 13-Nov-1950 Sex: female Admission Date (Current Location): 03/07/2023  [facility] and [place] Number:  Chiropodist and Address:  [facility], [address] [place], [place] [address]      Provider Number: [id number]  Attending Physician Name and Address:  Arnetha Courser, MD  Relative Name and Phone Number:       Current Level of Care: Hospital Recommended Level of Care: Skilled Nursing Facility Prior Approval Number:    Date Approved/Denied:   PASRR Number: [id number]  Discharge Plan: SNF    Current Diagnoses: Patient Active Problem List   Diagnosis Date Noted   Pressure injury of skin 03/08/2023   Obesity (BMI 30-39.9) 03/07/2023   Myocardial injury 03/07/2023   Depression with anxiety 03/07/2023   Sepsis (HCC) 01/30/2023   Chronic obstructive pulmonary disease (COPD) (HCC) 01/30/2023   Recurrent UTI 01/30/2023   Otitis externa 01/30/2023   Hypotension 01/30/2023   Peripheral neuropathy 01/30/2023   Acute pharyngitis 01/30/2023   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   Severe protein-calorie malnutrition (HCC) 10/10/2022   History of pulmonary embolus 2020(PE) 04/07/2021   Acute diverticulitis 01/30/2021   Current chronic use of systemic steroids 01/30/2021   Chronic diastolic CHF (congestive heart failure) (HCC) 01/30/2021   Compression fracture of T2 vertebra (HCC) 01/30/2021   S/P PICC central line placement 01/30/2021   Sinus tachycardia 11/12/2020   COVID-19 virus infection 11/11/2020   Colostomy in place [facility]) 11/09/2020   AKI (acute kidney injury) (HCC) 11/09/2020   Severe sepsis with septic shock (HCC) 11/07/2020   Pain in joint, shoulder region 07/03/2020   Giant cell arteritis (HCC) 05/01/2020   History of renal cell carcinoma 04/25/2020   HFrEF (heart failure with reduced ejection fraction) (HCC)  03/22/2020   Frontal headache 05/30/2019   Carcinoma, renal cell, left (HCC) 11/14/2018   Pulmonary embolism (HCC) 07/13/2018   DDD (degenerative disc disease), lumbar 06/30/2018   Chronic low back pain 06/30/2018   Hip pain, bilateral 06/30/2018   Chronic, continuous use of opioids 06/30/2018   PVC (premature ventricular contraction) 06/04/2017   Chest pain 10/19/2016   Presence of right artificial knee joint 05/18/2016   Pes anserinus bursitis of left knee 05/18/2016   Unilateral primary osteoarthritis, right knee 12/13/2015   Hx of total knee arthroplasty 12/13/2015   Osteoarthritis of left knee 08/02/2015   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   Morbid obesity with BMI of 45.0-49.9, adult (HCC) 07/25/2015   Spinal stenosis of lumbar region 06/19/2015   Chronic pain syndrome 10/30/2014    Orientation RESPIRATION BLADDER Height & Weight     Self, Time, Situation, Place  O2 (Nasal Cannula 3 L) Incontinent, External catheter Weight: 254 lb 10.1 oz (115.5 kg) Height:  5\' 8"  (172.7 cm)  BEHAVIORAL SYMPTOMS/MOOD NEUROLOGICAL BOWEL NUTRITION STATUS   (None)  (None) Incontinent, Colostomy Diet (DYS 3)  AMBULATORY STATUS COMMUNICATION OF NEEDS Skin   Extensive Assist Verbally PU Stage and Appropriate Care   PU Stage 2 Dressing:  (Left and right ischial tuberosity: Foam.)                   Personal Care Assistance Level of Assistance  Bathing, Feeding, Dressing Bathing Assistance: Maximum assistance Feeding assistance: Limited assistance Dressing Assistance: Maximum assistance     Functional Limitations Info  Sight, Hearing, Speech Sight Info: Adequate Hearing Info: Adequate Speech Info:  Adequate    SPECIAL CARE FACTORS FREQUENCY  PT (By licensed PT), OT (By licensed OT)     PT Frequency: 5 x week OT Frequency: 5 x week            Contractures Contractures Info: Not present    Additional Factors Info  Code Status, Allergies Code Status Info:  Full code Allergies Info: Hydrocodone-acetaminophen, Iodine, Metal, Tape, Peanut Oil, Shellfish Allergy           Current Medications (03/10/2023):  This is the current hospital active medication list Current Facility-Administered Medications  Medication Dose Route Frequency Provider Last Rate Last Admin   oxyCODONE (Oxy IR/ROXICODONE) immediate release tablet 10 mg  10 mg Oral Q6H PRN Otelia Sergeant, RPH   10 mg at 03/09/23 0830   And   acetaminophen (TYLENOL) tablet 325 mg  325 mg Oral Q6H PRN Otelia Sergeant, RPH   325 mg at 03/08/23 1231   acetaminophen (TYLENOL) tablet 650 mg  650 mg Oral Q6H PRN Lorretta Harp, MD       albuterol (PROVENTIL) (2.5 MG/3ML) 0.083% nebulizer solution 2.5 mg  2.5 mg Nebulization Q4H PRN Lorretta Harp, MD       apixaban Everlene Balls) tablet 5 mg  5 mg Oral BID Arnetha Courser, MD   5 mg at 03/10/23 4332   ascorbic acid (VITAMIN C) tablet 500 mg  500 mg Oral Daily Lorretta Harp, MD   500 mg at 03/10/23 9518   aspirin EC tablet 81 mg  81 mg Oral Daily Lorretta Harp, MD   81 mg at 03/10/23 8416   calcium carbonate (OS-CAL - dosed in mg of elemental calcium) tablet 2,500 mg  2 tablet Oral Q breakfast Lorretta Harp, MD   2,500 mg at 03/10/23 6063   cholecalciferol (VITAMIN D3) 25 MCG (1000 UNIT) tablet 1,000 Units  1,000 Units Oral Daily Lorretta Harp, MD   1,000 Units at 03/10/23 0160   cyanocobalamin (VITAMIN B12) tablet 1,000 mcg  1,000 mcg Oral Daily Lorretta Harp, MD   1,000 mcg at 03/10/23 1093   dextromethorphan-guaiFENesin (MUCINEX DM) 30-600 MG per 12 hr tablet 1 tablet  1 tablet Oral BID PRN Lorretta Harp, MD       diphenhydrAMINE (BENADRYL) capsule 25 mg  25 mg Oral Q6H PRN Lorretta Harp, MD       docusate sodium (COLACE) capsule 100 mg  100 mg Oral BID PRN Lorretta Harp, MD       famotidine (PEPCID) tablet 20 mg  20 mg Oral BID WC Lorretta Harp, MD   20 mg at 03/10/23 0859   ferrous sulfate tablet 325 mg  325 mg Oral BID WC Lorretta Harp, MD   325 mg at 03/10/23 0858   fluticasone (FLONASE)  50 MCG/ACT nasal spray 1 spray  1 spray Each Nare Daily Lorretta Harp, MD   1 spray at 03/09/23 1011   folic acid (FOLVITE) tablet 1 mg  1 mg Oral Daily Lorretta Harp, MD   1 mg at 03/10/23 2355   gabapentin (NEURONTIN) capsule 300 mg  300 mg Oral TID Lorretta Harp, MD   300 mg at 03/10/23 0858   hydrocortisone (CORTEF) tablet 15 mg  15 mg Oral Q breakfast Barrie Folk, RPH   15 mg at 03/10/23 7322   hydrocortisone (CORTEF) tablet 5 mg  5 mg Oral BID AC Barrie Folk, RPH       lactose free nutrition (BOOST PLUS) liquid 237 mL  237 mL Oral TID  BM Arnetha Courser, MD   237 mL at 03/09/23 1533   loratadine (CLARITIN) tablet 10 mg  10 mg Oral QPM Lorretta Harp, MD   10 mg at 03/09/23 1723   melatonin tablet 2.5 mg  2.5 mg Oral QHS PRN Lorretta Harp, MD       metoprolol tartrate (LOPRESSOR) tablet 25 mg  25 mg Oral BID Arnetha Courser, MD   25 mg at 03/10/23 0859   midodrine (PROAMATINE) tablet 15 mg  15 mg Oral TID WC Arnetha Courser, MD   15 mg at 03/10/23 0858   montelukast (SINGULAIR) tablet 10 mg  10 mg Oral Daily Lorretta Harp, MD   10 mg at 03/10/23 0858   nortriptyline (PAMELOR) capsule 50 mg  50 mg Oral QHS Lorretta Harp, MD   50 mg at 03/09/23 2204   ondansetron (ZOFRAN) injection 4 mg  4 mg Intravenous Q8H PRN Lorretta Harp, MD       pantoprazole (PROTONIX) EC tablet 40 mg  40 mg Oral Q breakfast Lorretta Harp, MD   40 mg at 03/10/23 1610   polyvinyl alcohol (LIQUIFILM TEARS) 1.4 % ophthalmic solution 1 drop  1 drop Both Eyes TID Lorretta Harp, MD   1 drop at 03/10/23 0900   thiamine (VITAMIN B1) tablet 100 mg  100 mg Oral Daily Lorretta Harp, MD   100 mg at 03/10/23 0857   torsemide (DEMADEX) tablet 20 mg  20 mg Oral Daily Arnetha Courser, MD   20 mg at 03/10/23 9604   vitamin A capsule 10,000 Units  10,000 Units Oral Daily Lorretta Harp, MD   10,000 Units at 03/10/23 0900     Discharge Medications: Please see discharge summary for a list of discharge medications.  Relevant Imaging Results:  Relevant Lab  Results:   Additional Information SS#: [id number]  Margarito Liner, LCSW

## 2023-03-10 NOTE — Plan of Care (Signed)
  Problem: Clinical Measurements: Goal: Ability to maintain clinical measurements within normal limits will improve Outcome: Progressing   Problem: Activity: Goal: Risk for activity intolerance will decrease Outcome: Progressing   Problem: Nutrition: Goal: Adequate nutrition will be maintained Outcome: Progressing   Problem: Coping: Goal: Level of anxiety will decrease Outcome: Progressing   Problem: Elimination: Goal: Will not experience complications related to bowel motility Outcome: Progressing   

## 2023-03-11 DIAGNOSIS — I959 Hypotension, unspecified: Secondary | ICD-10-CM | POA: Diagnosis not present

## 2023-03-11 DIAGNOSIS — I5023 Acute on chronic systolic (congestive) heart failure: Secondary | ICD-10-CM | POA: Diagnosis present

## 2023-03-11 DIAGNOSIS — A419 Sepsis, unspecified organism: Secondary | ICD-10-CM

## 2023-03-11 DIAGNOSIS — I5043 Acute on chronic combined systolic (congestive) and diastolic (congestive) heart failure: Secondary | ICD-10-CM | POA: Diagnosis present

## 2023-03-11 DIAGNOSIS — Z86711 Personal history of pulmonary embolism: Secondary | ICD-10-CM | POA: Diagnosis not present

## 2023-03-11 DIAGNOSIS — I471 Supraventricular tachycardia, unspecified: Secondary | ICD-10-CM | POA: Diagnosis not present

## 2023-03-11 DIAGNOSIS — I428 Other cardiomyopathies: Secondary | ICD-10-CM

## 2023-03-11 DIAGNOSIS — I9589 Other hypotension: Secondary | ICD-10-CM | POA: Diagnosis not present

## 2023-03-11 MED ORDER — METOPROLOL SUCCINATE ER 25 MG PO TB24
12.5000 mg | ORAL_TABLET | Freq: Every day | ORAL | Status: DC
  Administered 2023-03-12 – 2023-03-27 (×14): 12.5 mg via ORAL
  Filled 2023-03-11 (×16): qty 1

## 2023-03-11 MED ORDER — FUROSEMIDE 10 MG/ML IJ SOLN
20.0000 mg | Freq: Once | INTRAMUSCULAR | Status: AC
  Administered 2023-03-11: 20 mg via INTRAVENOUS
  Filled 2023-03-11: qty 2

## 2023-03-11 MED ORDER — AMIODARONE HCL 200 MG PO TABS
200.0000 mg | ORAL_TABLET | Freq: Two times a day (BID) | ORAL | Status: DC
  Administered 2023-03-11 – 2023-03-13 (×5): 200 mg via ORAL
  Filled 2023-03-11 (×5): qty 1

## 2023-03-11 MED ORDER — FUROSEMIDE 10 MG/ML IJ SOLN
20.0000 mg | Freq: Two times a day (BID) | INTRAMUSCULAR | Status: DC
Start: 2023-03-11 — End: 2023-03-15
  Administered 2023-03-11 – 2023-03-15 (×7): 20 mg via INTRAVENOUS
  Filled 2023-03-11 (×8): qty 2

## 2023-03-11 NOTE — TOC Progression Note (Signed)
 Transition of Care [facility]) - Progression Note    Patient Details  Name: Wendy Sanchez MRN: [id number] Date of Birth: 1950/09/12  Transition of [facility]) CM/SW Contact  Truddie Hidden, RN Phone Number: 03/11/2023, 10:28 AM  Clinical Narrative:    Per message received from [facility], Sarah, insurance is offering a peer-to-peer review. MD needs to call [phone], option 5. Deadline is 2:30 today. MD provided with details needed for peer to peer including: name, DOB and ID: [id number].   Expected Discharge Plan: Skilled Nursing Facility Barriers to Discharge: Continued Medical Work up  Expected Discharge Plan and Services     Post Acute Care Choice: Skilled Nursing Facility Living arrangements for the past 2 months: Skilled Nursing Facility                                       Social Determinants of Health (SDOH) Interventions SDOH Screenings   Food Insecurity: No Food Insecurity (03/08/2023)  Housing: Low Risk  (03/08/2023)  Transportation Needs: No Transportation Needs (03/08/2023)  Recent Concern: Transportation Needs - Unmet Transportation Needs (01/24/2023)   [facility] from [facility]  Utilities: Not At Risk (03/08/2023)  Depression (PHQ2-9): Low Risk  (07/01/2022)  Financial Resource Strain: Low Risk  (12/26/2020)   [facility] from [facility]  Social Connections: Unknown (03/08/2023)  Tobacco Use: High Risk (03/08/2023)    Readmission Risk Interventions    03/08/2023   11:09 AM 02/03/2021    9:12 AM  Readmission Risk Prevention Plan  Transportation Screening Complete Complete  Medication Review (RN Care Manager) Complete Complete  PCP or Specialist appointment within 3-5 days of discharge Complete Complete  HRI or Home Care Consult  Complete  SW Recovery Care/Counseling Consult Complete   Palliative Care Screening Not Applicable Not Applicable  Skilled Nursing Facility Complete Complete

## 2023-03-11 NOTE — Plan of Care (Signed)
  Problem: Health Behavior/Discharge Planning: Goal: Ability to manage health-related needs will improve Outcome: Progressing   Problem: Education: Goal: Knowledge of General Education information will improve Description: Including pain rating scale, medication(s)/side effects and non-pharmacologic comfort measures Outcome: Progressing   Problem: Clinical Measurements: Goal: Respiratory complications will improve Outcome: Progressing   Problem: Clinical Measurements: Goal: Cardiovascular complication will be avoided Outcome: Progressing   Problem: Elimination: Goal: Will not experience complications related to bowel motility Outcome: Progressing   Problem: Elimination: Goal: Will not experience complications related to urinary retention Outcome: Progressing   Problem: Pain Managment: Goal: General experience of comfort will improve and/or be controlled Outcome: Progressing   Problem: Safety: Goal: Ability to remain free from injury will improve Outcome: Progressing

## 2023-03-11 NOTE — Progress Notes (Signed)
 Heart Failure Stewardship Pharmacy Note  PCP: Pcp, No PCP-Cardiologist: Alwyn Pea, MD  HPI: Wendy Sanchez is a 73 y.o. female with COPD, CHF with EF of 40-45%, depression, obesity, anemia, DVT and PE on Eliquis, temporal arteritis, renal cell carcinoma, adrenal insufficiency, s/p for colostomy secondary to perforated diverticulitis who presented with fever and increased urinary frequency. Urina and blood cultures have been negative. On admission, BNP was 193.3, HS-troponin was 217 > 251 >241 >150, and lactic acid 1.7  >2.2 > 1.5. Chest x-ray noted no active pulmonary disease.     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 Underwent PVC ablation in 05/2017. Echo in 08/2017 with LVEF 20-25% and grade I diastolic dysfunction. Echo in 11/2020 showed LVEF improved to 60-65%. Echo this admission noted LVEF back down to 35-40%, mild LVH, severely dilated LV, mildly dilated LA, small pericardial effusion, mild MR, mild AS, and grade I diastolic dysfunction.   Pertinent Lab Values: Creatinine, Ser  Date Value Ref Range Status  03/10/2023 0.68 0.44 - 1.00 mg/dL Final   BUN  Date Value Ref Range Status  03/10/2023 26 (H) 8 - 23 mg/dL Final  01/60/1093 11 8 - 27 mg/dL Final   Potassium  Date Value Ref Range Status  03/10/2023 3.6 3.5 - 5.1 mmol/L Final   Sodium  Date Value Ref Range Status  03/10/2023 136 135 - 145 mmol/L Final  04/11/2020 140 134 - 144 mmol/L Final   B Natriuretic Peptide  Date Value Ref Range Status  03/07/2023 193.3 (H) 0.0 - 100.0 pg/mL Final    Comment:    Performed at [facility], [address] [place], [place] [address]   Magnesium  Date Value Ref Range Status  03/09/2023 2.0 1.7 - 2.4 mg/dL Final    Comment:    Performed at [facility], [address] [place], [place] [address]   Hgb A1c MFr Bld  Date Value Ref Range  Status  03/07/2023 4.9 4.8 - 5.6 % Final    Comment:    (NOTE) Pre diabetes:          5.7%-6.4%  Diabetes:              >6.4%  Glycemic control for   <7.0% adults with diabetes    TSH  Date Value Ref Range Status  11/25/2020 1.080 0.350 - 4.500 uIU/mL Final    Comment:    Performed by a 3rd Generation assay with a functional sensitivity of <=0.01 uIU/mL. Performed at [facility], [address] [place], [place] [address]   02/25/2017 1.820 0.450 - 4.500 uIU/mL Final    Vital Signs: Admission weight: 259 lbs Temp:  [97.6 F (36.4 C)-98.6 F (37 C)] 97.6 F (36.4 C) (02/27 0855) Pulse Rate:  [65-77] 67 (02/27 0855) Cardiac Rhythm: Normal sinus rhythm (02/26 2127) Resp:  [16-19] 16 (02/27 0855) BP: (99-123)/(57-74) 107/68 (02/27 0855) SpO2:  [100 %] 100 % (02/27 0855) Weight:  [117.8 kg (259 lb 11.2 oz)] 117.8 kg (259 lb 11.2 oz) (02/27 0502)  Intake/Output Summary (Last 24 hours) at 03/11/2023 0913 Last data filed at 03/10/2023 2001 Gross per 24 hour  Intake --  Output 1300 ml  Net -1300 ml    Current Heart Failure Medications:  Loop diuretic: torsemide 20 mg daily Beta-Blocker: metoprolol tartrate 25 mg BID ACEI/ARB/ARNI: none MRA: none SGLT2i: none Other: midodrine 15 mg TID  Prior  to admission Heart Failure Medications:  Loop diuretic: torsemide 20 mg daily Beta-Blocker: metoprolol tartrate 25 mg BID ACEI/ARB/ARNI: none MRA: none SGLT2i: none Other: midodrine 5 mg TID  Assessment: 1. Acute on chronic combined systolic and diastolic heart failure (LVEF 35-40%) with grade I diastolic dysfunction, due to NICM likely secondary to PVCs. Does have low voltage QRS and peripheral neuropathy. Echo parameters noted are not typical of amyloid. NYHA class III symptoms.  -Symptoms: Reports shortness of breath is better when laying down. Reports LEE. Denies any dizziness or lightheadedness. -Volume: Appears hypervolemic with pitting LEE. May be  intravascularly euvolemic. Creatinine and BUN were trending down yesterday. In this case, can treat with oral torsemide for slower diuresis that allows time for extravascular fluid to redistribute. May require increasing to 40 mg daily.  -Hemodynamics: BP is stable, though currently on midodrine 15 mg TID. This increased afterload in patients with HFrEF can lead to worsening cardiac function. Recommend attempting to titrate down as tolerated. She will do better with lower BP and less afterload than BP of 120/80 with higher afterload. -BB: Currently on metoprolol tartrate 25 mg BID for PVC suppression. Consider transition to metoprolol succinate prior to discharge given HFrEF. -ACEI/ARB/ARNI: Not a candidate currently due to midodrine requirement. -MRA: Not a candidate currently due to midodrine requirement. -SGLT2i: Not a candidate due to frequent UTIs. -Prior PVC ablation. If PVCs are >10%, cardiac function may benefit from PVC suppression with amiodarone or mexiletine.   Plan: 1) Medication changes recommended at this time: -Consider increasing torsemide to 40 mg daily -Consider decreasing midodrine to 10 mg every 8 hours  2) Patient assistance: -Pending  3) Education: - Patient has been educated on current HF medications and potential additions to HF medication regimen - Patient verbalizes understanding that over the next few months, these medication doses may change and more medications may be added to optimize HF regimen - Patient has been educated on basic disease state pathophysiology and goals of therapy  Medication Assistance / Insurance Benefits Check: Does the patient have prescription insurance?    Type of insurance plan:  Does the patient qualify for medication assistance through manufacturers or grants? Pending   Outpatient Pharmacy: Prior to admission outpatient pharmacy: [facility]      Please do not hesitate to reach out with questions or concerns,  Enos Fling, PharmD,  CPP, BCPS Heart Failure Pharmacist  Phone - [phone] 03/11/2023 9:13 AM

## 2023-03-11 NOTE — Plan of Care (Signed)

## 2023-03-11 NOTE — Progress Notes (Addendum)
 Progress Note    Wendy Sanchez  AOZ:[id number] DOB: 29-Jun-1950  DOA: 03/07/2023 PCP: Pcp, No      Brief Narrative:    Medical records reviewed and are as summarized below:  Wendy Sanchez is a 73 y.o. female  with medical history significant of COPD, CHF with EF of 40-45%, depression, obesity, anemia, DVT and PE on Eliquis, temporal arteritis, renal cell carcinoma, adrenal insufficiency, s/p for colostomy secondary to perforated diverticulitis, who presented to the hospital with fever and increased urinary frequency.  Patient was found to have hypotension with blood pressure 86/59, which improved to 101/70 after giving 2 L LR bolus in ED, but blood pressure  dropped again to 82/61 with MAP of 69.  WBC 10.3, GFR> 60, negative PCR for COVID, flu and RSV, UA (hazy appearance, small amount of leukocyte, rare bacteria, WBC 11-20), lactic acid of 1.7 --> 2.2 --> 1.5, troponin 217 --> 251. Chest x-ray negative for infiltration.    2/24: Blood pressure with some improvement to 97/63, patient was started on midodrine and stress doses of steroid.  Elevated leukocytosis today likely secondary to stress doses of steroid.  Preliminary blood cultures negative.  Urine cultures pending.  Troponin peaked at 251 and now trending down.   Family is very concerned about this recurrent UTI, patient has multiple hospitalization for this and has been seen outpatient urology, there were recommending continuing antibiotics at suppressive doses.   2/25: Vital stable.  Blood pressure seems within goal now.  Mild hypokalemia which are being repleted.  Blood cultures remain negative, urine cultures with no growth.  Stopping antibiotics.  Sepsis ruled out.  Increasing the dose of midodrine. Had an episode of sinus tachycardia, restarting home metoprolol and torsemide.  PT is recommending SNF, not sure if she has any days left   2/26: Vital stable with blood pressure in low 100s.  Echo with EF of 35 to 40%, left  ventricular global hypokinesis and severely dilated cavity, grade 1 diastolic dysfunction.  Small pericardial effusion. Patient currently stable to go back to her facility, she needs another insurance authorization which was started today.  She will go back once insurance authorization got approval.  Palliative care team was consulted because of multiple comorbidities and high risk for readmissions and mortality     Assessment/Plan:   Principal Problem:   Hypotension Active Problems:   Secondary adrenal insufficiency (HCC)   Myocardial injury   Chronic obstructive pulmonary disease (COPD) (HCC)   Chronic pain syndrome   HFrEF (heart failure with reduced ejection fraction) (HCC)   History of pulmonary embolus 2020(PE)   Depression with anxiety   Obesity (BMI 30-39.9)   Pressure injury of skin    Body mass index is 39.49 kg/m.  (Class II obesity)    Hypotension, secondary adrenal insufficiency: Continue midodrine and hydrocortisone. S/p treatment  Sepsis has been ruled out.  No evidence of UTI or pneumonia.   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  Give 1 dose of IV Lasix today and monitor for clinical response. Consulted Dr. Mariah Milling, cardiologist because of report of chest tightness, shortness of breath, elevated troponins, mild aortic stenosis and decreased EF on recent 2D echo. BNP was 193.3 2D echo showed EF estimated at 35 to 40%, mild LVH, grade 1 diastolic dysfunction mildly enlarged RV size, mildly dilated left atrium size,  small pericardial effusion, mild MR, mild to moderate TR, mild aortic stenosis Previous 2D echo in September 2024 showed EF estimated at 40 to 45%   Elevated troponins: Troponin was 251 on admission and dropped to 61.  She has a history of elevated troponins   Paroxysmal SVT: Heart rate went up into the  160s.  Cardiologist consulted.   History of pulmonary embolism in 2020: Continue Eliquis   Stage II decubitus ulcers on bilateral ischial tuberosity (present on admission): Continue local wound care   Comorbidities include COPD, chronic pain syndrome, depression, anxiety,   Peer-to-peer review was requested by Manufacturing engineer.  Case was discussed with Manufacturing engineer.  He has denied authorization for SNF because the patient is not medically stable at this time.    Diet Order             DIET DYS 3 Room service appropriate? Yes; Fluid consistency: Thin  Diet effective now                            Consultants: Cardiologist  Procedures: None    Medications:    apixaban  5 mg Oral BID   ascorbic acid  500 mg Oral Daily   aspirin EC  81 mg Oral Daily   calcium carbonate  2 tablet Oral Q breakfast   cholecalciferol  1,000 Units Oral Daily   cyanocobalamin  1,000 mcg Oral Daily   famotidine  20 mg Oral BID WC   ferrous sulfate  325 mg Oral BID WC   fluticasone  1 spray Each Nare Daily   folic acid  1 mg Oral Daily   gabapentin  300 mg Oral TID   hydrocortisone  15 mg Oral Q breakfast   hydrocortisone  5 mg Oral BID AC   lactose free nutrition  237 mL Oral TID BM   loratadine  10 mg Oral QPM   metoprolol tartrate  25 mg Oral BID   midodrine  15 mg Oral TID WC   montelukast  10 mg Oral Daily   nortriptyline  50 mg Oral QHS   pantoprazole  40 mg Oral Q breakfast   polyvinyl alcohol  1 drop Both Eyes TID   thiamine  100 mg Oral Daily   torsemide  20 mg Oral Daily   vitamin A  10,000 Units Oral Daily   Continuous Infusions:   Anti-infectives (From admission, onward)    Start     Dose/Rate Route Frequency Ordered Stop   03/08/23 2100  vancomycin (VANCOREADY) IVPB 1500 mg/300 mL  Status:  Discontinued        1,500 mg 150 mL/hr over 120 Minutes Intravenous Every 24 hours 03/08/23 0111 03/08/23 0711   03/08/23 2100  vancomycin  (VANCOREADY) IVPB 1750 mg/350 mL  Status:  Discontinued        1,750 mg 175 mL/hr over 120 Minutes Intravenous Every 24 hours 03/08/23 0711 03/09/23 1122   03/08/23 0400  meropenem (MERREM) 1 g in sodium chloride 0.9 % 100 mL IVPB  Status:  Discontinued        1 g 200 mL/hr over 30 Minutes Intravenous Every 8 hours 03/08/23 0013 03/09/23 1122   03/08/23 0015  vancomycin (VANCOREADY) IVPB 1500 mg/300 mL        1,500 mg 150 mL/hr over 120 Minutes Intravenous  Once 03/08/23 0004 03/08/23 0238   03/07/23 2045  ceFEPIme (MAXIPIME) 2 g in sodium chloride 0.9 %  100 mL IVPB        2 g 200 mL/hr over 30 Minutes Intravenous  Once 03/07/23 2032 03/07/23 2105   03/07/23 2045  vancomycin (VANCOCIN) IVPB 1000 mg/200 mL premix        1,000 mg 200 mL/hr over 60 Minutes Intravenous  Once 03/07/23 2032 03/07/23 2207              Family Communication/Anticipated D/C date and plan/Code Status   DVT prophylaxis:  apixaban (ELIQUIS) tablet 5 mg     Code Status: Full Code  Family Communication: None Disposition Plan: Plan to discharge to SNF   Status is: Inpatient Remains inpatient appropriate because: CHF exacerbation       Subjective:   Interval events noted.  She complains of chest tightness and SOB  Objective:    Vitals:   03/11/23 0502 03/11/23 0855 03/11/23 1042 03/11/23 1311  BP:  107/68 (!) 103/59 99/70  Pulse:  67 72 79  Resp:  16  16  Temp:  97.6 F (36.4 C)  97.7 F (36.5 C)  TempSrc:      SpO2:  100%  95%  Weight: 117.8 kg     Height:       No data found.   Intake/Output Summary (Last 24 hours) at 03/11/2023 1332 Last data filed at 03/11/2023 1313 Gross per 24 hour  Intake --  Output 2200 ml  Net -2200 ml   Filed Weights   03/09/23 0517 03/10/23 0550 03/11/23 0502  Weight: 116.1 kg 115.5 kg 117.8 kg    Exam:  GEN: NAD SKIN: Warm and dry EYES: No pallor or icterus ENT: MMM CV: RRR PULM: Bibasilar rales, no wheezing ABD: soft, obese, NT,  +BS CNS: AAO x 3, non focal EXT: Significant b/l leg edema, no tenderness      Data Reviewed:   I have personally reviewed following labs and imaging studies:  Labs: Labs show the following:   Basic Metabolic Panel: Recent Labs  Lab 03/07/23 2036 03/08/23 0341 03/09/23 0434 03/10/23 0410  NA 136 135 137 136  K 3.8 3.9 3.3* 3.6  CL 98 98 102 103  CO2 27 24 25 26   GLUCOSE 108* 132* 115* 102*  BUN 23 20 27* 26*  CREATININE 0.84 0.73 0.79 0.68  CALCIUM 7.2* 7.4* 7.4* 7.3*  MG  --   --  2.0  --    GFR Estimated Creatinine Clearance: 87.1 mL/min (by C-G formula based on SCr of 0.68 mg/dL). Liver Function Tests: Recent Labs  Lab 03/07/23 2036  AST 23  ALT 15  ALKPHOS 168*  BILITOT 0.7  PROT 5.1*  ALBUMIN <1.5*   No results for input(s): "LIPASE", "AMYLASE" in the last 168 hours. No results for input(s): "AMMONIA" in the last 168 hours. Coagulation profile Recent Labs  Lab 03/07/23 2036  INR 1.9*    CBC: Recent Labs  Lab 03/07/23 2036 03/08/23 0515 03/09/23 0434 03/10/23 0410  WBC 10.3 21.3* 9.6 7.7  NEUTROABS 8.2*  --   --   --   HGB 8.6* 8.3* 8.4* 8.1*  HCT 26.5* 25.7* 25.0* 24.2*  MCV 87.5 86.0 83.3 83.4  PLT 585* 603* 595* 574*   Cardiac Enzymes: No results for input(s): "CKTOTAL", "CKMB", "CKMBINDEX", "TROPONINI" in the last 168 hours. BNP (last 3 results) No results for input(s): "PROBNP" in the last 8760 hours. CBG: No results for input(s): "GLUCAP" in the last 168 hours. D-Dimer: No results for input(s): "DDIMER" in the last 72 hours. Hgb  A1c: No results for input(s): "HGBA1C" in the last 72 hours. Lipid Profile: No results for input(s): "CHOL", "HDL", "LDLCALC", "TRIG", "CHOLHDL", "LDLDIRECT" in the last 72 hours. Thyroid function studies: No results for input(s): "TSH", "T4TOTAL", "T3FREE", "THYROIDAB" in the last 72 hours.  Invalid input(s): "FREET3" Anemia work up: No results for input(s): "VITAMINB12", "FOLATE", "FERRITIN",  "TIBC", "IRON", "RETICCTPCT" in the last 72 hours. Sepsis Labs: Recent Labs  Lab 03/07/23 2035 03/07/23 2036 03/07/23 2251 03/07/23 2353 03/08/23 0515 03/09/23 0434 03/10/23 0410  WBC  --  10.3  --   --  21.3* 9.6 7.7  LATICACIDVEN 1.7  --  2.2* 1.5  --   --   --     Microbiology Recent Results (from the past 240 hours)  Resp panel by RT-PCR (RSV, Flu A&B, Covid) Anterior Nasal Swab     Status: None   Collection Time: 03/07/23  8:35 PM   Specimen: Anterior Nasal Swab  Result Value Ref Range Status   SARS Coronavirus 2 by RT PCR NEGATIVE NEGATIVE Final    Comment: (NOTE) SARS-CoV-2 target nucleic acids are NOT DETECTED.  The SARS-CoV-2 RNA is generally detectable in upper respiratory specimens during the acute phase of infection. The lowest concentration of SARS-CoV-2 viral copies this assay can detect is 138 copies/mL. A negative result does not preclude SARS-Cov-2 infection and should not be used as the sole basis for treatment or other patient management decisions. A negative result may occur with  improper specimen collection/handling, submission of specimen other than nasopharyngeal swab, presence of viral mutation(s) within the areas targeted by this assay, and inadequate number of viral copies(<138 copies/mL). A negative result must be combined with clinical observations, patient history, and epidemiological information. The expected result is Negative.  Fact Sheet for Patients:  BloggerCourse.com  Fact Sheet for Healthcare Providers:  SeriousBroker.it  This test is no t yet approved or cleared by the [place] FDA and  has been authorized for detection and/or diagnosis of SARS-CoV-2 by FDA under an Emergency Use Authorization (EUA). This EUA will remain  in effect (meaning this test can be used) for the duration of the COVID-19 declaration under Section 564(b)(1) of the Act, 21 U.S.C.section 360bbb-3(b)(1),  unless the authorization is terminated  or revoked sooner.       Influenza A by PCR NEGATIVE NEGATIVE Final   Influenza B by PCR NEGATIVE NEGATIVE Final    Comment: (NOTE) The Xpert Xpress SARS-CoV-2/FLU/RSV plus assay is intended as an aid in the diagnosis of influenza from Nasopharyngeal swab specimens and should not be used as a sole basis for treatment. Nasal washings and aspirates are unacceptable for Xpert Xpress SARS-CoV-2/FLU/RSV testing.  Fact Sheet for Patients: BloggerCourse.com  Fact Sheet for Healthcare Providers: SeriousBroker.it  This test is not yet approved or cleared by the [place] FDA and has been authorized for detection and/or diagnosis of SARS-CoV-2 by FDA under an Emergency Use Authorization (EUA). This EUA will remain in effect (meaning this test can be used) for the duration of the COVID-19 declaration under Section 564(b)(1) of the Act, 21 U.S.C. section 360bbb-3(b)(1), unless the authorization is terminated or revoked.     Resp Syncytial Virus by PCR NEGATIVE NEGATIVE Final    Comment: (NOTE) Fact Sheet for Patients: BloggerCourse.com  Fact Sheet for Healthcare Providers: SeriousBroker.it  This test is not yet approved or cleared by the [place] FDA and has been authorized for detection and/or diagnosis of SARS-CoV-2 by FDA under an Emergency Use Authorization (EUA). This  EUA will remain in effect (meaning this test can be used) for the duration of the COVID-19 declaration under Section 564(b)(1) of the Act, 21 U.S.C. section 360bbb-3(b)(1), unless the authorization is terminated or revoked.  Performed at [facility], [address] [place], [place] [address]   Blood Culture (routine x 2)     Status: None (Preliminary result)   Collection Time: 03/07/23  8:35 PM   Specimen: BLOOD  Result Value Ref Range Status    Specimen Description BLOOD BLOOD LEFT HAND  Final   Special Requests   Final    BOTTLES DRAWN AEROBIC AND ANAEROBIC Blood Culture results may not be optimal due to an excessive volume of blood received in culture bottles   Culture   Final    NO GROWTH 4 DAYS Performed at [facility], [address], [place], [place] [address]    Report Status PENDING  Incomplete  Blood Culture (routine x 2)     Status: None (Preliminary result)   Collection Time: 03/07/23  8:36 PM   Specimen: BLOOD  Result Value Ref Range Status   Specimen Description BLOOD BLOOD LEFT ARM  Final   Special Requests   Final    BOTTLES DRAWN AEROBIC AND ANAEROBIC Blood Culture results may not be optimal due to an excessive volume of blood received in culture bottles   Culture   Final    NO GROWTH 4 DAYS Performed at [facility], [address] [place], [place] [address]    Report Status PENDING  Incomplete  Urine Culture     Status: None   Collection Time: 03/07/23  9:26 PM   Specimen: Urine, Random  Result Value Ref Range Status   Specimen Description   Final    URINE, RANDOM Performed at [facility], [address] [place], [place] [address]    Special Requests   Final    NONE Reflexed from [id number] Performed at [facility], [address] [place], [place] [address]    Culture   Final    NO GROWTH Performed at [facility], [address], [place], [place] [address]    Report Status 03/09/2023 FINAL  Final    Procedures and diagnostic studies:  No results found.              LOS: 4 days   Wendy Sanchez  [facility]   Pager on [email or web address]. If 7PM-7AM, please contact night-coverage at [email or web address]     03/11/2023, 1:32 PM

## 2023-03-11 NOTE — Progress Notes (Signed)
 PT Cancellation Note  Patient Details Name: Wendy Sanchez MRN: [id number] DOB: 28-Jan-1950   Cancelled Treatment:     PT attempt. Per pt," The doctor told me not to do PT today due  to my heart." Reached out to MD. Cardiology consult pending. Will hold PT today and return tomorrow as requested.     Rushie Chestnut 03/11/2023, 2:27 PM

## 2023-03-11 NOTE — Consult Note (Signed)
 Cardiology Consultation   Patient ID: Wendy Sanchez MRN: [id number]; DOB: April 13, 1950  Admit date: 03/07/2023 Date of Consult: 03/11/2023  PCP:  Oneita Hurt No   [facility] Providers Cardiologist:  Alwyn Pea, MD  Electrophysiologist:  Sherryl Manges, MD       Patient Profile:   Wendy Sanchez is a 73 y.o. female with a hx of arthritis, asthma, CHF, COPD, fibromyalgia, GERD, hyperlipidemia, hypertension, morbid obesity a/p gastric banding, NICM, DVT/PE on Eliquis, PVCs, OSA non on CPAP, frequent PVCs s/p ablation, spinal stenosis, temporal arteritis, and vertigo who is being seen 03/11/2023 for the evaluation of acute on chronic CHF at the request of Dr. Myriam Forehand.  History of Present Illness:   Ms. Torti has a longstanding history of LV dysfunction dating back to 2018, at that time EF 25 to 30%, diagnostic cath showed normal coronaries. Also with history of high PVC burden, previously on amiodarone, status post ablation in 2019.  Follows with [facility] cardiology who is medically managing her nonischemic cardiomyopathy. Echo 11/2020 showed EF improved to 60 to 65%. Echo 03/2021 with EF 55%. Most recent echo 09/2022 with EF 40 to 45%. Was most recently seen by [facility] cardiology 05/2022 at that time she was doing well on medical management. She was experiencing intermittent palpitations. GDMT was limited by low to normal blood pressure. Patient reported that her blood pressure normally runs around 80/40 mmHg. Continued on torsemide 10 mg daily, discussed option to add midodrine to increase blood pressure, patient declined.  Presented 2/23 via EMS from [facility] for fever of 100.1 and hypotension. In the ED, BP 91/52, HR 120, RR 24, T 99.31F, and SpO2 99%. BMP largely unremarkable. CBC with hgb 8.6, hct 26.5. Lactic acid 2.2. BNP 193. Troponin 217>251. EKG without ST changes.  Blood pressure continued to run low after IV fluids.  Initially thought to have sepsis secondary to UTI and was started  on antibiotics, however blood cultures and urine cultures both negative. Antibiotics later stopped after sepsis ruled out.  Blood pressures continue to run low and patient was started on midodrine 10 mg and later increased to 15 mg 3 times daily with improvement. Echo 2/25 showed EF 35 to 40%, left ventricular global hypokinesia, and severely dilated cavity, grade 1 diastolic dysfunction and small pericardial effusion. Cardiology consulted 2/27 for acute on chronic heart failure.  Past Medical History:  Diagnosis Date   Allergic rhinitis    Arthritis    Asthma    problems with fumes and aerosols cause asthma   Chest pain 10/19/2016   CHF (congestive heart failure) (HCC)    Complication of anesthesia    awakens during surgery; has occurred with last 3-4 surgeries    COPD (chronic obstructive pulmonary disease) (HCC)    DVT (deep venous thrombosis) (HCC) 07/13/2018   Right leg   Environmental allergies    fumes    Fibromyalgia    GERD (gastroesophageal reflux disease)    Headache    History of bronchitis    Hx of total knee arthroplasty 12/13/2015   Hyperlipidemia    Hypertension    Morbid obesity with BMI of 45.0-49.9, adult (HCC) 07/25/2015   NICM (nonischemic cardiomyopathy) (HCC)    a. ? PVC mediated;  b. 06/2016 Echo: EF 25-30%, mild LVH, mild LAE, mild AI/MR/TR/PR; c. 08/2016 Cath: nl cors, EF 25%.   Numbness    hands bilat when driving; improves when not preforming task    Osteoarthritis of left knee 08/02/2015  PE (pulmonary thromboembolism) (HCC) 07/13/2018   Pes anserinus bursitis of left knee 05/18/2016   Presence of right artificial knee joint 05/18/2016   PVC (premature ventricular contraction) 06/04/2017   PVC's (premature ventricular contractions)    a. 11/2016 Amio started;  b. 11/29/16 48h Holter: 57574 PVC's (41%); c. 12/2016 24h Holter: 18040 PVC's (15%); c. 02/2017 48h Holter: 37262 PVC's (41%).   Sleep apnea    cannot tolerate CPAP   Spinal stenosis of lumbar  region 06/19/2015   Status post gastric banding    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   Temporal arteritis (HCC) 05/01/2020   Tinnitus    comes and goes    Unilateral primary osteoarthritis, right knee 12/13/2015   Vertigo    none for over 2 yrs    Past Surgical History:  Procedure Laterality Date   ABDOMINAL HYSTERECTOMY  1979   left ovary remains   ABDOMINAL HYSTERECTOMY     ARTERY BIOPSY Left 06/07/2019   Procedure: BIOPSY TEMPORAL ARTERY;  Surgeon: Annice Needy, MD;  Location: [facility] ORS;  Service: Vascular;  Laterality: Left;   CHOLECYSTECTOMY     COLON SURGERY     COLONOSCOPY     COLONOSCOPY N/A 10/30/2019   Procedure: COLONOSCOPY;  Surgeon: Regis Bill, MD;  Location: [facility] ENDOSCOPY;  Service: Endoscopy;  Laterality: N/A;   ESOPHAGOGASTRODUODENOSCOPY N/A 10/30/2019   Procedure: ESOPHAGOGASTRODUODENOSCOPY (EGD);  Surgeon: Regis Bill, MD;  Location: [facility] ENDOSCOPY;  Service: Endoscopy;  Laterality: N/A;   fibrous tissue removed from right shoulder and back of neck      2 years ago    FLOOR OF MOUTH BIOPSY N/A 02/28/2019   Procedure: SALIVARY GLAND BIOPSY;  Surgeon: Bud Face, MD;  Location: [facility] ORS;  Service: ENT;  Laterality: N/A;   FRACTURE SURGERY     left small finger   GANGLION CYST EXCISION     GASTRIC BYPASS  1980   states had bypass with banding and band left in -   GASTRIC BYPASS OPEN     JOINT REPLACEMENT Bilateral 2017   knees   KNEE SURGERY Bilateral    both knees  2001   LAPAROSCOPIC SIGMOID COLECTOMY Left 09/13/2020   at [facility] for a perforated sigmoid diverticulitis   NECK SURGERY N/A 1205/19   ganglion cyst removal, benigh    PARTIAL COLECTOMY  09/2020   PULMONARY THROMBECTOMY Bilateral 07/14/2018   Procedure: PULMONARY THROMBECTOMY;  Surgeon: Annice Needy, MD;  Location: [facility] INVASIVE CV LAB;  Service: Cardiovascular;  Laterality: Bilateral;   PVC ABLATION N/A 06/04/2017   Procedure: PVC ABLATION;  Surgeon:  Marinus Maw, MD;  Location: [facility] INVASIVE CV LAB;  Service: Cardiovascular;  Laterality: N/A;   RIGHT/LEFT HEART CATH AND CORONARY ANGIOGRAPHY Bilateral 08/12/2016   Procedure: Right/Left Heart Cath and Coronary Angiography;  Surgeon: Alwyn Pea, MD;  Location: [facility] INVASIVE CV LAB;  Service: Cardiovascular;  Laterality: Bilateral;   TONSILLECTOMY     age 50   TOTAL KNEE ARTHROPLASTY Left 08/02/2015   Procedure: LEFT TOTAL KNEE ARTHROPLASTY;  Surgeon: Kathryne Hitch, MD;  Location: [facility] ORS;  Service: Orthopedics;  Laterality: Left;   TOTAL KNEE ARTHROPLASTY Right 12/13/2015   Procedure: RIGHT TOTAL KNEE ARTHROPLASTY;  Surgeon: Kathryne Hitch, MD;  Location: [facility] ORS;  Service: Orthopedics;  Laterality: Right;       Inpatient Medications: Scheduled Meds:  apixaban  5 mg Oral BID   ascorbic acid  500 mg Oral Daily   aspirin  EC  81 mg Oral Daily   calcium carbonate  2 tablet Oral Q breakfast   cholecalciferol  1,000 Units Oral Daily   cyanocobalamin  1,000 mcg Oral Daily   famotidine  20 mg Oral BID WC   ferrous sulfate  325 mg Oral BID WC   fluticasone  1 spray Each Nare Daily   folic acid  1 mg Oral Daily   gabapentin  300 mg Oral TID   hydrocortisone  15 mg Oral Q breakfast   hydrocortisone  5 mg Oral BID AC   lactose free nutrition  237 mL Oral TID BM   loratadine  10 mg Oral QPM   metoprolol tartrate  25 mg Oral BID   midodrine  15 mg Oral TID WC   montelukast  10 mg Oral Daily   nortriptyline  50 mg Oral QHS   pantoprazole  40 mg Oral Q breakfast   polyvinyl alcohol  1 drop Both Eyes TID   thiamine  100 mg Oral Daily   torsemide  20 mg Oral Daily   vitamin A  10,000 Units Oral Daily   Continuous Infusions:  PRN Meds: oxyCODONE **AND** acetaminophen, acetaminophen, albuterol, dextromethorphan-guaiFENesin, diphenhydrAMINE, docusate sodium, melatonin, ondansetron (ZOFRAN) IV  Allergies:    Allergies  Allergen Reactions   Hydrocodone-Acetaminophen  Swelling    hands   Iodine Anaphylaxis and Swelling    Iv dye 02/03/21-Ispoke to patient and she had IV contrast  X4 last year and has had no problem. ( Last CT abdomen with contrast was on 01/02/21.-    Other Other (See Comments)    ALLERGY TO METAL - BLACKENS SKIN AND CAUSES A RASH    Tape Swelling and Other (See Comments)    Skin comes off.  Paper tape is ok tegaderm OK   Peanut Oil Other (See Comments)    ** Nuts cause runny nose   Shellfish Allergy Nausea And Vomiting and Swelling    Episode of GI infection after eating clam chowder. Still eats shrimp and other seafood    Social History:   Social History   Socioeconomic History   Marital status: Single    Spouse name: Not on file   Number of children: Not on file   Years of education: Not on file   Highest education level: Not on file  Occupational History   Not on file  Tobacco Use   Smoking status: Former    Current packs/day: 0.00    Average packs/day: 0.3 packs/day for 50.0 years (12.5 ttl pk-yrs)    Types: Cigarettes    Start date: 08/07/1970    Quit date: 08/06/2020    Years since quitting: 2.5   Smokeless tobacco: Current    Types: Chew  Vaping Use   Vaping status: Never Used  Substance and Sexual Activity   Alcohol use: No    Alcohol/week: 0.0 standard drinks of alcohol   Drug use: No   Sexual activity: Not Currently  Other Topics Concern   Not on file  Social History Narrative   Not on file   Social Drivers of Health   Financial Resource Strain: Low Risk  (12/26/2020)   Received from [facility], [facility]   Overall Financial Resource Strain (CARDIA)    Difficulty of Paying Living Expenses: Not very hard  Food Insecurity: No Food Insecurity (03/08/2023)   Hunger Vital Sign    Worried About Running Out of Food in the Last Year: Never true    Ran  Out of Food in the Last Year: Never true  Transportation Needs: No Transportation Needs (03/08/2023)   PRAPARE - Scientist, research (physical sciences) (Medical): No    Lack of Transportation (Non-Medical): No  Recent Concern: Transportation Needs - Unmet Transportation Needs (01/24/2023)   Received from [facility] - Transportation    Lack of Transportation (Medical): Yes    Lack of Transportation (Non-Medical): No  Physical Activity: Not on file  Stress: Not on file  Social Connections: Unknown (03/08/2023)   Social Connection and Isolation Panel [NHANES]    Frequency of Communication with Friends and Family: More than three times a week    Frequency of Social Gatherings with Friends and Family: More than three times a week    Attends Religious Services: More than 4 times per year    Active Member of [employer] or Organizations: No    Attends Engineer, structural: More than 4 times per year    Marital Status: Patient declined  Intimate Partner Violence: Not At Risk (03/08/2023)   Humiliation, Afraid, Rape, and Kick questionnaire    Fear of Current or Ex-Partner: No    Emotionally Abused: No    Physically Abused: No    Sexually Abused: No    Family History:    Family History  Problem Relation Age of Onset   Hypertension Father    Deep vein thrombosis Father    Dementia Mother    Diabetes Sister    Congestive Heart Failure Sister    Congestive Heart Failure Brother    Congestive Heart Failure Daughter    Congestive Heart Failure Son    Breast cancer Maternal Aunt        great aunt     ROS:  Please see the history of present illness.   Physical Exam/Data:   Vitals:   03/11/23 0502 03/11/23 0855 03/11/23 1042 03/11/23 1311  BP:  107/68 (!) 103/59 99/70  Pulse:  67 72 79  Resp:  16  16  Temp:  97.6 F (36.4 C)  97.7 F (36.5 C)  TempSrc:      SpO2:  100%  95%  Weight: 117.8 kg     Height:        Intake/Output Summary (Last 24 hours) at 03/11/2023 1406 Last data filed at 03/11/2023 1313 Gross per 24 hour  Intake --  Output 2200 ml  Net -2200 ml      03/11/2023    5:02 AM  03/10/2023    5:50 AM 03/09/2023    5:17 AM  Last 3 Weights  Weight (lbs) 259 lb 11.2 oz 254 lb 10.1 oz 255 lb 15.3 oz  Weight (kg) 117.8 kg 115.5 kg 116.1 kg     Body mass index is 39.49 kg/m.  General:  Well nourished, well developed, in no acute distress HEENT: normal Neck: + JVD Cardiac:  normal S1, S2; RRR; no murmur  Lungs:  clear to auscultation bilaterally, no wheezing, rhonchi or rales  Abd: soft, nontender, no hepatomegaly  Ext: 2+ pitting edema Skin: warm and dry  Psych:  Normal affect   Telemetry:  Telemetry was personally reviewed and demonstrates: Sinus rhythm with one run of NSVT rate 175 bpm ~1313 on 2/27 lasting approximately 9 minutes  Relevant CV Studies:  03/09/2023 Echo complete 1. Left ventricular ejection fraction, by estimation, is 35 to 40%. The  left ventricle has moderately decreased function. The left ventricle  demonstrates global hypokinesis. The left ventricular  internal cavity size  was severely dilated. There is mild  left ventricular hypertrophy. Left ventricular diastolic parameters are  consistent with Grade I diastolic dysfunction (impaired relaxation).  Elevated left atrial pressure. The average left ventricular global  longitudinal strain is -8.6 %. The global  longitudinal strain is abnormal.   2. Right ventricular systolic function is normal. The right ventricular  size is mildly enlarged. There is normal pulmonary artery systolic  pressure.   3. Left atrial size was mildly dilated.   4. A small pericardial effusion is present. The pericardial effusion is  posterior to the left ventricle.   5. The mitral valve is degenerative. Mild mitral valve regurgitation. No  evidence of mitral stenosis.   6. Tricuspid valve regurgitation is mild to moderate.   7. The aortic valve is tricuspid. There is moderate calcification of the  aortic valve. There is moderate thickening of the aortic valve. Aortic  valve regurgitation is trivial. Mild aortic  valve stenosis. Aortic valve  mean gradient measures 10.0 mmHg.   8. There is mild dilatation of the ascending aorta, measuring 38 mm.   9. The inferior vena cava is dilated in size with >50% respiratory  variability, suggesting right atrial pressure of 8 mmHg.   Laboratory Data:  High Sensitivity Troponin:   Recent Labs  Lab 03/07/23 2251 03/07/23 2353 03/08/23 0341 03/08/23 0515 03/08/23 1054  TROPONINIHS 251* 241* 150* 113* 61*     Chemistry Recent Labs  Lab 03/08/23 0341 03/09/23 0434 03/10/23 0410  NA 135 137 136  K 3.9 3.3* 3.6  CL 98 102 103  CO2 24 25 26   GLUCOSE 132* 115* 102*  BUN 20 27* 26*  CREATININE 0.73 0.79 0.68  CALCIUM 7.4* 7.4* 7.3*  MG  --  2.0  --   GFRNONAA >60 >60 >60  ANIONGAP 13 10 7     Recent Labs  Lab 03/07/23 2036  PROT 5.1*  ALBUMIN <1.5*  AST 23  ALT 15  ALKPHOS 168*  BILITOT 0.7   Lipids  Recent Labs  Lab 03/08/23 0341  CHOL 97  TRIG 123  HDL 27*  LDLCALC 45  CHOLHDL 3.6    Hematology Recent Labs  Lab 03/08/23 0515 03/09/23 0434 03/10/23 0410  WBC 21.3* 9.6 7.7  RBC 2.99* 3.00* 2.90*  HGB 8.3* 8.4* 8.1*  HCT 25.7* 25.0* 24.2*  MCV 86.0 83.3 83.4  MCH 27.8 28.0 27.9  MCHC 32.3 33.6 33.5  RDW 16.6* 16.2* 16.8*  PLT 603* 595* 574*   Thyroid No results for input(s): "TSH", "FREET4" in the last 168 hours.  BNP Recent Labs  Lab 03/07/23 2035  BNP 193.3*    DDimer No results for input(s): "DDIMER" in the last 168 hours.   Assessment and Plan:   Acute on chronic HFrEF - Longstanding history of nonischemic cardiomyopathy dating back to 2018, most recent echo 09/2022 with EF 40 to 45% - Earlier in admission patient was thought to have sepsis secondary to UTI and treated with IV fluids, however urine and blood cultures both negative and sepsis ruled out - Echo this admission with EF 35-40% - BNP 193 - Restarted on home torsemide dose 20 mg oral daily on 2/25 with poorly recorded I/Os - Cr stable at 0.73 -  Appears volume overloaded on exam - Will start IV Lasix 20 mg BID - Continue to monitor renal function, strict I/Os, and daily weights with diuresis - Continue metoprolol tartrate 25 mg BID - Progression of GDMT limited by hypotension,  patient not a good candidate for SGLTi in the setting of recurrent UTIs  Supraventricular tachycardia History of PVCs s/p ablation 2019 - Patient reports symptoms of intermittent palpitations starting yesterday - One episode of SVT at 175 bpm noted on telemetry 2/27 ~1313 lasting for 9 minutes with spontaneous conversion to sinus rhythm - This could be contributing to CHF exacerbation - Continue metoprolol tartrate 25 mg BID - Will start amiodarone 200 mg BID  Hypotension - Upon chart review, patient has chronically low BP with baseline ~80/40 mmHg - BP now stable at 100s/60s mmHg on average - Continue midodrine 15 mg TID  Elevated troponin - Troponin peaked at 251 on 2/23 - Denies chest pain - EKG without ischemic changes - Suspect demand ischemia in the setting of SVT and acute CHF exacerbation - No indication for ischemic evaluation at this time  History of PE - Continue Eliquis 5 mg BID  For questions or updates, please contact [facility] Please consult [email or web address] for contact info under    Signed, Orion Crook, PA-C  03/11/2023 2:06 PM

## 2023-03-12 ENCOUNTER — Encounter: Payer: Self-pay | Admitting: Internal Medicine

## 2023-03-12 DIAGNOSIS — Z515 Encounter for palliative care: Secondary | ICD-10-CM

## 2023-03-12 DIAGNOSIS — I471 Supraventricular tachycardia, unspecified: Secondary | ICD-10-CM | POA: Diagnosis not present

## 2023-03-12 DIAGNOSIS — I5023 Acute on chronic systolic (congestive) heart failure: Secondary | ICD-10-CM

## 2023-03-12 DIAGNOSIS — I5043 Acute on chronic combined systolic (congestive) and diastolic (congestive) heart failure: Secondary | ICD-10-CM | POA: Diagnosis not present

## 2023-03-12 DIAGNOSIS — J432 Centrilobular emphysema: Secondary | ICD-10-CM | POA: Diagnosis not present

## 2023-03-12 DIAGNOSIS — I9589 Other hypotension: Secondary | ICD-10-CM | POA: Diagnosis not present

## 2023-03-12 LAB — CULTURE, BLOOD (ROUTINE X 2)
Culture: NO GROWTH
Culture: NO GROWTH

## 2023-03-12 MED ORDER — SPIRONOLACTONE 12.5 MG HALF TABLET
12.5000 mg | ORAL_TABLET | Freq: Every day | ORAL | Status: DC
  Administered 2023-03-13 – 2023-03-27 (×15): 12.5 mg via ORAL
  Filled 2023-03-12 (×15): qty 1

## 2023-03-12 NOTE — Progress Notes (Signed)
 Heart Failure Nurse Navigator Progress Note  PCP: Pcp, No PCP-Cardiologist: Julien Nordmann, MD Admission Diagnosis: Sepsis, dure to unspecified organism, unspecified whether acute organ dysfunction present [facility]) Admitted from: [employer] via EMS  Presentation:   Wendy Sanchez presented with fever 100.1 hypotension with blood pressures in the 80's and tachycardia in the 120's. She reported feeling generally weak and had a headache. BNP 193.3. CXR: Cardiomegaly.  ECHO/ LVEF: 35-40%  Clinical Course:  Past Medical History:  Diagnosis Date   Allergic rhinitis    Arthritis    Asthma    problems with fumes and aerosols cause asthma   Chest pain 10/19/2016   CHF (congestive heart failure) (HCC)    Complication of anesthesia    awakens during surgery; has occurred with last 3-4 surgeries    COPD (chronic obstructive pulmonary disease) (HCC)    DVT (deep venous thrombosis) (HCC) 07/13/2018   Right leg   Environmental allergies    fumes    Fibromyalgia    GERD (gastroesophageal reflux disease)    Headache    History of bronchitis    Hx of total knee arthroplasty 12/13/2015   Hyperlipidemia    Hypertension    Morbid obesity with BMI of 45.0-49.9, adult (HCC) 07/25/2015   NICM (nonischemic cardiomyopathy) (HCC)    a. ? PVC mediated;  b. 06/2016 Echo: EF 25-30%, mild LVH, mild LAE, mild AI/MR/TR/PR; c. 08/2016 Cath: nl cors, EF 25%.   Numbness    hands bilat when driving; improves when not preforming task    Osteoarthritis of left knee 08/02/2015   PE (pulmonary thromboembolism) (HCC) 07/13/2018   Pes anserinus bursitis of left knee 05/18/2016   Presence of right artificial knee joint 05/18/2016   PVC (premature ventricular contraction) 06/04/2017   PVC's (premature ventricular contractions)    a. 11/2016 Amio started;  b. 11/29/16 48h Holter: 57574 PVC's (41%); c. 12/2016 24h Holter: 18040 PVC's (15%); c. 02/2017 48h Holter: 37262 PVC's (41%).   Sleep apnea    cannot tolerate  CPAP   Spinal stenosis of lumbar region 06/19/2015   Status post gastric banding    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   Temporal arteritis (HCC) 05/01/2020   Tinnitus    comes and goes    Unilateral primary osteoarthritis, right knee 12/13/2015   Vertigo    none for over 2 yrs     Social History   Socioeconomic History   Marital status: Single    Spouse name: Not on file   Number of children: 3   Years of education: Not on file   Highest education level: Some college, no degree  Occupational History   Not on file  Tobacco Use   Smoking status: Former    Current packs/day: 0.00    Average packs/day: 0.3 packs/day for 50.0 years (12.5 ttl pk-yrs)    Types: Cigarettes    Start date: 08/07/1970    Quit date: 08/06/2020    Years since quitting: 2.5   Smokeless tobacco: Current    Types: Chew  Vaping Use   Vaping status: Never Used  Substance and Sexual Activity   Alcohol use: No    Alcohol/week: 0.0 standard drinks of alcohol   Drug use: No   Sexual activity: Not Currently  Other Topics Concern   Not on file  Social History Narrative   Not on file   Social Drivers of Health   Financial Resource Strain: Low Risk  (12/26/2020)   Received from [facility], [facility]   Overall Financial Resource Strain (CARDIA)    Difficulty of Paying Living Expenses: Not very hard  Food Insecurity: No Food Insecurity (03/12/2023)   Hunger Vital Sign    Worried About Running Out of Food in the Last Year: Never true    Ran Out of Food in the Last Year: Never true  Transportation Needs: Unmet Transportation Needs (03/12/2023)   PRAPARE - Administrator, Civil Service (Medical): Yes    Lack of Transportation (Non-Medical): Yes  Physical Activity: Not on file  Stress: Not on file  Social Connections: Unknown (03/08/2023)   Social Connection and Isolation Panel [NHANES]    Frequency of Communication with Friends and Family: More than three times a week     Frequency of Social Gatherings with Friends and Family: More than three times a week    Attends Religious Services: More than 4 times per year    Active Member of [employer] or Organizations: No    Attends Engineer, structural: More than 4 times per year    Marital Status: Patient declined   Education Assessment and Provision:  Detailed education and instructions provided on heart failure disease management including the following:  Signs and symptoms of Heart Failure When to call the physician Importance of daily weights Low sodium diet Fluid restriction Medication management Anticipated future follow-up appointments  Patient education given on each of the above topics.  Patient acknowledges understanding via teach back method and acceptance of all instructions.  Education Materials:  "Living Better With Heart Failure" Booklet, HF zone tool, & Daily Weight Tracker Tool.  Patient has scale at home: Is unable to weight due to immobility. Patient has pill box at home: [facility] providing patient medications prior to this visit.    High Risk Criteria for Readmission and/or Poor Patient Outcomes: Heart failure hospital admissions (last 6 months): 1  No Show rate: 3% Difficult social situation: Lives @ [employer] [facility] medication adherence: Yes Primary Language: English Literacy level: Reading, Writing, & Comprehension  Barriers of Care:   Patient is wheelchair bound so she has difficulty getting to follow-up appointments because her [facility] Niece is not wheelchair accessible if she is at home and not in a facility.   Considerations/Referrals:   Referral made to Heart Failure Pharmacist Stewardship: Yes Referral made to Heart Failure CSW/NCM TOC: Yes Referral made to Heart & Vascular TOC clinic: Yes  Items for Follow-up on DC/TOC: Daily Weights Diet & Fluid Restrictions Continued Heart Failure Education   Roxy Horseman, RN, BSN [facility] Heart Failure  Navigator Secure Chat Only

## 2023-03-12 NOTE — Plan of Care (Signed)
  Problem: Education: Goal: Knowledge of General Education information will improve Description: Including pain rating scale, medication(s)/side effects and non-pharmacologic comfort measures Outcome: Progressing   Problem: Clinical Measurements: Goal: Will remain free from infection Outcome: Progressing   Problem: Clinical Measurements: Goal: Respiratory complications will improve Outcome: Progressing   Problem: Clinical Measurements: Goal: Cardiovascular complication will be avoided Outcome: Progressing   Problem: Elimination: Goal: Will not experience complications related to bowel motility Outcome: Progressing   Problem: Pain Managment: Goal: General experience of comfort will improve and/or be controlled Outcome: Progressing   Problem: Safety: Goal: Ability to remain free from injury will improve Outcome: Progressing   Problem: Safety: Goal: Ability to remain free from injury will improve Outcome: Progressing

## 2023-03-12 NOTE — Progress Notes (Signed)
 Heart Failure Stewardship Pharmacy Note  PCP: Pcp, No PCP-Cardiologist: Pcp, No  HPI: Wendy Sanchez is a 73 y.o. female with COPD, CHF with EF of 40-45%, depression, obesity, anemia, DVT and PE on Eliquis, temporal arteritis, renal cell carcinoma, adrenal insufficiency, s/p for colostomy secondary to perforated diverticulitis who presented with fever and increased urinary frequency. Urina and blood cultures have been negative. On admission, BNP was 193.3, HS-troponin was 217 > 251 >241 >150, and lactic acid 1.7  >2.2 > 1.5. Chest x-ray noted no active pulmonary disease.     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 Underwent PVC ablation in 05/2017. Echo in 08/2017 with LVEF 20-25% and grade I diastolic dysfunction. Echo in 11/2020 showed LVEF improved to 60-65%. Echo this admission noted LVEF back down to 35-40%, mild LVH, severely dilated LV, mildly dilated LA, small pericardial effusion, mild MR, mild AS, and grade I diastolic dysfunction.   Pertinent Lab Values: Creatinine, Ser  Date Value Ref Range Status  03/10/2023 0.68 0.44 - 1.00 mg/dL Final   BUN  Date Value Ref Range Status  03/10/2023 26 (H) 8 - 23 mg/dL Final  16/10/9602 11 8 - 27 mg/dL Final   Potassium  Date Value Ref Range Status  03/10/2023 3.6 3.5 - 5.1 mmol/L Final   Sodium  Date Value Ref Range Status  03/10/2023 136 135 - 145 mmol/L Final  04/11/2020 140 134 - 144 mmol/L Final   B Natriuretic Peptide  Date Value Ref Range Status  03/07/2023 193.3 (H) 0.0 - 100.0 pg/mL Final    Comment:    Performed at [facility], [address] [place], [place] [address]   Magnesium  Date Value Ref Range Status  03/09/2023 2.0 1.7 - 2.4 mg/dL Final    Comment:    Performed at [facility], [address] [place], [place] [address]   Hgb A1c MFr Bld  Date Value Ref Range Status   03/07/2023 4.9 4.8 - 5.6 % Final    Comment:    (NOTE) Pre diabetes:          5.7%-6.4%  Diabetes:              >6.4%  Glycemic control for   <7.0% adults with diabetes    TSH  Date Value Ref Range Status  11/25/2020 1.080 0.350 - 4.500 uIU/mL Final    Comment:    Performed by a 3rd Generation assay with a functional sensitivity of <=0.01 uIU/mL. Performed at [facility], [address] [place], [place] [address]   02/25/2017 1.820 0.450 - 4.500 uIU/mL Final    Vital Signs: Admission weight: 259 lbs Temp:  [97.7 F (36.5 C)-98.2 F (36.8 C)] 98.1 F (36.7 C) (02/28 0845) Pulse Rate:  [72-95] 94 (02/28 1000) Cardiac Rhythm: Sinus tachycardia;Normal sinus rhythm (02/28 0818) Resp:  [16-19] 16 (02/28 0845) BP: (97-111)/(53-70) 111/65 (02/28 1000) SpO2:  [90 %-100 %] 90 % (02/28 0845) Weight:  [120.3 kg (265 lb 3.4 oz)] 120.3 kg (265 lb 3.4 oz) (02/28 0500)  Intake/Output Summary (Last 24 hours) at 03/12/2023 1112 Last data filed at 03/12/2023 0900 Gross per 24 hour  Intake 570 ml  Output 2100 ml  Net -1530 ml    Current Heart Failure Medications:  Loop diuretic: torsemide 20 mg daily Beta-Blocker: metoprolol tartrate 25 mg BID ACEI/ARB/ARNI: none MRA: none SGLT2i: none Other: midodrine 15 mg TID  Prior to admission Heart Failure Medications:  Loop diuretic: torsemide 20 mg daily Beta-Blocker: metoprolol tartrate 25 mg BID ACEI/ARB/ARNI: none MRA: none SGLT2i: none Other: midodrine 5 mg TID  Assessment: 1. Acute on chronic combined systolic and diastolic heart failure (LVEF 35-40%) with grade I diastolic dysfunction, due to NICM likely secondary to PVCs. Does have low voltage QRS and peripheral neuropathy. Echo parameters noted are not typical of amyloid. NYHA class III symptoms.  -Symptoms: Reports shortness of breath is better when laying down. Reports LEE. Denies any dizziness or lightheadedness, though not mobile currently. Appetite is  fair. -Volume: Appears hypervolemic with pitting LEE. Creatinine and BUN were trending not measured in 2 days. Changed from oral torsemide to furosemide 20 mg IV BID. Weight charted up 6 lbs. 20 mg of IV furosemide is equivalent to 20 mg of PO torsemide. Consider doubling dose. -Hemodynamics: BP is stable, though currently on midodrine 15 mg TID. This increased afterload in patients with HFrEF can lead to worsening cardiac function. Recommend attempting to titrate down as tolerated.   -BB: Metoprolol dose reduced and changed to succinate given low BP and volume overload. Continue metoprolol succinate 12.5 mg daily. -ACEI/ARB/ARNI: Not a candidate currently due to midodrine requirement. -MRA: Not a candidate currently due to midodrine requirement. -SGLT2i: Not a candidate due to frequent UTIs. -Prior PVC ablation. If PVC burden is >10%, cardiac function may benefit from PVC suppression with amiodarone or mexiletine.   Plan: 1) Medication changes recommended at this time: -Consider increasing furosemide to 40 mg IV twice daily   2) Patient assistance: -Pending  3) Education: - Patient has been educated on current HF medications and potential additions to HF medication regimen - Patient verbalizes understanding that over the next few months, these medication doses may change and more medications may be added to optimize HF regimen - Patient has been educated on basic disease state pathophysiology and goals of therapy  Medication Assistance / Insurance Benefits Check: Does the patient have prescription insurance?   Yes  Type of insurance plan:  Does the patient qualify for medication assistance through manufacturers or grants? Pending   Outpatient Pharmacy: Prior to admission outpatient pharmacy: [facility] Is the patient agreeable to switch to [facility]patient [facility]?: Yes Is the patient willing to utilize a [facility] pharmacy at discharge?: Yes Patient would like to enroll in [facility]  mail order pharmacy program.   Please do not hesitate to reach out with questions or concerns,  Enos Fling, PharmD, CPP, BCPS Heart Failure Pharmacist  Phone - [phone] 03/12/2023 11:12 AM

## 2023-03-12 NOTE — Progress Notes (Signed)
 Rounding Note    Patient Name: Wendy Sanchez Date of Encounter: 03/12/2023  [facility] Cardiologist: Julien Nordmann, MD   Subjective   Patient seen on rounds.  Denies any chest pain or worsening shortness of breath.  -1.6 L output in the last 24 hours.  Episodes of SVT up to rates of 180s noted overnight.  Patient complains of headache but no other symptoms noted.  Inpatient Medications    Scheduled Meds:  amiodarone  200 mg Oral BID   apixaban  5 mg Oral BID   ascorbic acid  500 mg Oral Daily   aspirin EC  81 mg Oral Daily   calcium carbonate  2 tablet Oral Q breakfast   cholecalciferol  1,000 Units Oral Daily   cyanocobalamin  1,000 mcg Oral Daily   famotidine  20 mg Oral BID WC   ferrous sulfate  325 mg Oral BID WC   fluticasone  1 spray Each Nare Daily   folic acid  1 mg Oral Daily   furosemide  20 mg Intravenous BID   gabapentin  300 mg Oral TID   hydrocortisone  15 mg Oral Q breakfast   hydrocortisone  5 mg Oral BID AC   lactose free nutrition  237 mL Oral TID BM   loratadine  10 mg Oral QPM   metoprolol succinate  12.5 mg Oral Daily   midodrine  15 mg Oral TID WC   montelukast  10 mg Oral Daily   nortriptyline  50 mg Oral QHS   pantoprazole  40 mg Oral Q breakfast   polyvinyl alcohol  1 drop Both Eyes TID   thiamine  100 mg Oral Daily   vitamin A  10,000 Units Oral Daily   Continuous Infusions:  PRN Meds: oxyCODONE **AND** acetaminophen, acetaminophen, albuterol, dextromethorphan-guaiFENesin, diphenhydrAMINE, docusate sodium, melatonin, ondansetron (ZOFRAN) IV   Vital Signs    Vitals:   03/12/23 0500 03/12/23 0845 03/12/23 1000 03/12/23 1234  BP:  111/62 111/65 103/61  Pulse:  89 94 (!) 107  Resp:  16  16  Temp:  98.1 F (36.7 C)  99.8 F (37.7 C)  TempSrc:      SpO2:  90%  100%  Weight: 120.3 kg     Height:        Intake/Output Summary (Last 24 hours) at 03/12/2023 1400 Last data filed at 03/12/2023 1100 Gross per 24 hour  Intake  570 ml  Output 2100 ml  Net -1530 ml      03/12/2023    5:00 AM 03/11/2023    5:02 AM 03/10/2023    5:50 AM  Last 3 Weights  Weight (lbs) 265 lb 3.4 oz 259 lb 11.2 oz 254 lb 10.1 oz  Weight (kg) 120.3 kg 117.8 kg 115.5 kg      Telemetry    Sinus rhythm with brief episodes of SVT noted overnight and unifocal PVCs with rates of 80-100- Personally Reviewed  ECG    No new tracings- Personally Reviewed  Physical Exam   GEN: No acute distress.   Neck: + JVD Cardiac: RRR, no murmurs, rubs, or gallops.  Respiratory: Clear with diminished bases to auscultation bilaterally.  Resp unlabored at rest on room air GI: Soft, nontender, non-distended  MS: 2+ pitting edema; No deformity. Neuro:  Nonfocal  Psych: Normal affect   Labs    High Sensitivity Troponin:   Recent Labs  Lab 03/07/23 2251 03/07/23 2353 03/08/23 0341 03/08/23 0515 03/08/23 1054  TROPONINIHS 251* 241*  150* 113* 61*     Chemistry Recent Labs  Lab 03/07/23 2036 03/08/23 0341 03/09/23 0434 03/10/23 0410  NA 136 135 137 136  K 3.8 3.9 3.3* 3.6  CL 98 98 102 103  CO2 27 24 25 26   GLUCOSE 108* 132* 115* 102*  BUN 23 20 27* 26*  CREATININE 0.84 0.73 0.79 0.68  CALCIUM 7.2* 7.4* 7.4* 7.3*  MG  --   --  2.0  --   PROT 5.1*  --   --   --   ALBUMIN <1.5*  --   --   --   AST 23  --   --   --   ALT 15  --   --   --   ALKPHOS 168*  --   --   --   BILITOT 0.7  --   --   --   GFRNONAA >60 >60 >60 >60  ANIONGAP 11 13 10 7     Lipids  Recent Labs  Lab 03/08/23 0341  CHOL 97  TRIG 123  HDL 27*  LDLCALC 45  CHOLHDL 3.6    Hematology Recent Labs  Lab 03/08/23 0515 03/09/23 0434 03/10/23 0410  WBC 21.3* 9.6 7.7  RBC 2.99* 3.00* 2.90*  HGB 8.3* 8.4* 8.1*  HCT 25.7* 25.0* 24.2*  MCV 86.0 83.3 83.4  MCH 27.8 28.0 27.9  MCHC 32.3 33.6 33.5  RDW 16.6* 16.2* 16.8*  PLT 603* 595* 574*   Thyroid No results for input(s): "TSH", "FREET4" in the last 168 hours.  BNP Recent Labs  Lab 03/07/23 2035   BNP 193.3*    DDimer No results for input(s): "DDIMER" in the last 168 hours.   Radiology    No results found.  Cardiac Studies   03/09/2023 Echo complete 1. Left ventricular ejection fraction, by estimation, is 35 to 40%. The  left ventricle has moderately decreased function. The left ventricle  demonstrates global hypokinesis. The left ventricular internal cavity size  was severely dilated. There is mild  left ventricular hypertrophy. Left ventricular diastolic parameters are  consistent with Grade I diastolic dysfunction (impaired relaxation).  Elevated left atrial pressure. The average left ventricular global  longitudinal strain is -8.6 %. The global  longitudinal strain is abnormal.   2. Right ventricular systolic function is normal. The right ventricular  size is mildly enlarged. There is normal pulmonary artery systolic  pressure.   3. Left atrial size was mildly dilated.   4. A small pericardial effusion is present. The pericardial effusion is  posterior to the left ventricle.   5. The mitral valve is degenerative. Mild mitral valve regurgitation. No  evidence of mitral stenosis.   6. Tricuspid valve regurgitation is mild to moderate.   7. The aortic valve is tricuspid. There is moderate calcification of the  aortic valve. There is moderate thickening of the aortic valve. Aortic  valve regurgitation is trivial. Mild aortic valve stenosis. Aortic valve  mean gradient measures 10.0 mmHg.   8. There is mild dilatation of the ascending aorta, measuring 38 mm.   9. The inferior vena cava is dilated in size with >50% respiratory  variability, suggesting right atrial pressure of 8 mmHg.     Patient Profile     73 y.o. female with past medical history of arthritis, asthma, CHF, COPD, fibromyalgia, GERD, hyperlipidemia, hypertension, morbid obesity status post gastric banding, NICM, DVT/PE on apixaban, PVCs, OSA not on CPAP, frequent PVCs status post ablation, spinal  stenosis, temporal arteritis, and vertigo,  who is being seen for the evaluation of acute on chronic CHF.  Assessment & Plan    Acute on chronic HFrEF -History of nonischemic cardiomyopathy dating back to 2018 with most recent echocardiogram completed 02/2023 revealed an LVEF of 35-40% -Earlier in admission patient was found to have sepsis secondary to UTI and treated with IV fluids have urine and blood cultures both were negative and sepsis was ruled out -BNP of 193 -Continued on furosemide 20 mg IV twice daily -Daily BMP while on diuretic therapy -Continue to monitor renal function, strict I's and O's, daily weights with diuresis -Continued on Toprol-XL 12.5 mg daily --1.6 L output in the last 24 hours -Unable to escalate GDMT due to hypotension -She is not a good candidate due to SGLT2 inhibitor in the setting of recurrent UTIs -Heart failure education  Supraventricular tachycardia/history of PVCs status post ablation in 2019 -Patient reports symptoms of intermittent palpitations -SVT episodes were short noted overnight on telemetry -Could be contributing to CHF exacerbation -Continued on amiodarone 200 mg twice daily -Continued on Toprol-XL 12.5 mg daily -Unable to uptitrate medications due to chronically low blood pressure  Chronic hypotension -Blood pressure 103/61 -Chronically low blood pressure with baseline approximately 80/40 mmHg -Continued on midodrine 15 mg 3 times daily -Vital signs per unit protocol  Elevated high-sensitivity troponin -High-sensitivity troponin peaked at 251 -Continues to deny chest pain  -EKG without ischemic changes -Suspect demand ischemia in the setting of SVT and acute CHF exacerbation -No indication for further ischemic evaluation at this time  History of PE/DVT -Continued on apixaban 5 mg twice daily     For questions or updates, please contact [facility] Please consult [email or web address] for contact info under         Signed, Leontine Radman, NP  03/12/2023, 2:00 PM

## 2023-03-12 NOTE — Progress Notes (Addendum)
 Progress Note    Wendy Sanchez  ZOX:[id number] DOB: 06-13-1950  DOA: 03/07/2023 PCP: Pcp, No      Brief Narrative:    Medical records reviewed and are as summarized below:  Wendy Sanchez is a 73 y.o. female  with medical history significant of COPD, CHF with EF of 40-45%, depression, obesity, anemia, DVT and PE on Eliquis, temporal arteritis, renal cell carcinoma, adrenal insufficiency, s/p for colostomy secondary to perforated diverticulitis, who presented to the hospital with fever and increased urinary frequency.  Patient was found to have hypotension with blood pressure 86/59, which improved to 101/70 after giving 2 L LR bolus in ED, but blood pressure  dropped again to 82/61 with MAP of 69.  WBC 10.3, GFR> 60, negative PCR for COVID, flu and RSV, UA (hazy appearance, small amount of leukocyte, rare bacteria, WBC 11-20), lactic acid of 1.7 --> 2.2 --> 1.5, troponin 217 --> 251. Chest x-ray negative for infiltration.    2/24: Blood pressure with some improvement to 97/63, patient was started on midodrine and stress doses of steroid.  Elevated leukocytosis today likely secondary to stress doses of steroid.  Preliminary blood cultures negative.  Urine cultures pending.  Troponin peaked at 251 and now trending down.   Family is very concerned about this recurrent UTI, patient has multiple hospitalization for this and has been seen outpatient urology, there were recommending continuing antibiotics at suppressive doses.   2/25: Vital stable.  Blood pressure seems within goal now.  Mild hypokalemia which are being repleted.  Blood cultures remain negative, urine cultures with no growth.  Stopping antibiotics.  Sepsis ruled out.  Increasing the dose of midodrine. Had an episode of sinus tachycardia, restarting home metoprolol and torsemide.  PT is recommending SNF, not sure if she has any days left   2/26: Vital stable with blood pressure in low 100s.  Echo with EF of 35 to 40%, left  ventricular global hypokinesis and severely dilated cavity, grade 1 diastolic dysfunction.  Small pericardial effusion. Patient currently stable to go back to her facility, she needs another insurance authorization which was started today.  She will go back once insurance authorization got approval.  Palliative care team was consulted because of multiple comorbidities and high risk for readmissions and mortality     Assessment/Plan:   Principal Problem:   Hypotension Active Problems:   Secondary adrenal insufficiency (HCC)   Myocardial injury   Chronic obstructive pulmonary disease (COPD) (HCC)   Chronic pain syndrome   HFrEF (heart failure with reduced ejection fraction) (HCC)   History of pulmonary embolus 2020(PE)   Depression with anxiety   Obesity (BMI 30-39.9)   Pressure injury of skin   Sepsis (HCC)   Nonischemic cardiomyopathy (HCC)   Acute on chronic combined systolic and diastolic CHF (congestive heart failure) (HCC)   Paroxysmal SVT (supraventricular tachycardia) (HCC)    Body mass index is 40.33 kg/m.  (Class II obesity)    Hypotension, secondary adrenal insufficiency: Continue midodrine and hydrocortisone. S/p treatment with stress doses of IV steroids. Sepsis has been ruled out.  No evidence of UTI or pneumonia.   Acute on chronic HFrEF, valvular heart disease (mild to moderate TR, mild MR, mild aortic stenosis): Continue IV Lasix. Follow-up with cardiologist. BNP was 193.3 2D echo showed EF estimated at 35 to 40%, mild LVH, grade 1 diastolic dysfunction mildly enlarged RV size, mildly dilated left atrium size, small pericardial effusion, mild MR, mild to moderate TR, mild  aortic stenosis Previous 2D echo in September 2024 showed EF estimated at 40 to 45% History of low EF of 20% in 2019 thought to be due to frequent PVCs but she did not have significant improvement in EF following PVC ablation at that time.   Elevated troponins: Troponin was 251 on  admission and dropped to 61.   She has been seen by the cardiologist.  No plan for ischemic workup at this time. She has a history of elevated troponins   Paroxysmal SVT: Continue Toprol-XL and amiodarone.   History of pulmonary embolism in 2020: Continue Eliquis   Stage II decubitus ulcers on bilateral ischial tuberosity (present on admission): Continue local wound care   Comorbidities include COPD, chronic pain syndrome, depression, anxiety,   Plan discussed with Wendy Sanchez, sister, over the phone   Diet Order             DIET DYS 3 Room service appropriate? Yes; Fluid consistency: Thin  Diet effective now                            Consultants: Cardiologist  Procedures: None    Medications:    amiodarone  200 mg Oral BID   apixaban  5 mg Oral BID   ascorbic acid  500 mg Oral Daily   aspirin EC  81 mg Oral Daily   calcium carbonate  2 tablet Oral Q breakfast   cholecalciferol  1,000 Units Oral Daily   cyanocobalamin  1,000 mcg Oral Daily   famotidine  20 mg Oral BID WC   ferrous sulfate  325 mg Oral BID WC   fluticasone  1 spray Each Nare Daily   folic acid  1 mg Oral Daily   furosemide  20 mg Intravenous BID   gabapentin  300 mg Oral TID   hydrocortisone  15 mg Oral Q breakfast   hydrocortisone  5 mg Oral BID AC   lactose free nutrition  237 mL Oral TID BM   loratadine  10 mg Oral QPM   metoprolol succinate  12.5 mg Oral Daily   midodrine  15 mg Oral TID WC   montelukast  10 mg Oral Daily   nortriptyline  50 mg Oral QHS   pantoprazole  40 mg Oral Q breakfast   polyvinyl alcohol  1 drop Both Eyes TID   thiamine  100 mg Oral Daily   vitamin A  10,000 Units Oral Daily   Continuous Infusions:   Anti-infectives (From admission, onward)    Start     Dose/Rate Route Frequency Ordered Stop   03/08/23 2100  vancomycin (VANCOREADY) IVPB 1500 mg/300 mL  Status:  Discontinued        1,500 mg 150 mL/hr over 120 Minutes Intravenous  Every 24 hours 03/08/23 0111 03/08/23 0711   03/08/23 2100  vancomycin (VANCOREADY) IVPB 1750 mg/350 mL  Status:  Discontinued        1,750 mg 175 mL/hr over 120 Minutes Intravenous Every 24 hours 03/08/23 0711 03/09/23 1122   03/08/23 0400  meropenem (MERREM) 1 g in sodium chloride 0.9 % 100 mL IVPB  Status:  Discontinued        1 g 200 mL/hr over 30 Minutes Intravenous Every 8 hours 03/08/23 0013 03/09/23 1122   03/08/23 0015  vancomycin (VANCOREADY) IVPB 1500 mg/300 mL        1,500 mg 150 mL/hr over 120 Minutes Intravenous  Once 03/08/23 0004 03/08/23  9528   03/07/23 2045  ceFEPIme (MAXIPIME) 2 g in sodium chloride 0.9 % 100 mL IVPB        2 g 200 mL/hr over 30 Minutes Intravenous  Once 03/07/23 2032 03/07/23 2105   03/07/23 2045  vancomycin (VANCOCIN) IVPB 1000 mg/200 mL premix        1,000 mg 200 mL/hr over 60 Minutes Intravenous  Once 03/07/23 2032 03/07/23 2207              Family Communication/Anticipated D/C date and plan/Code Status   DVT prophylaxis:  apixaban (ELIQUIS) tablet 5 mg     Code Status: Full Code  Family Communication: Plan discussed with Wendy Sanchez, sister, over the phone Disposition Plan: Plan to discharge to SNF   Status is: Inpatient Remains inpatient appropriate because: CHF exacerbation       Subjective:   Interval events noted.  She feels a little better today.  Objective:    Vitals:   03/12/23 0500 03/12/23 0845 03/12/23 1000 03/12/23 1234  BP:  111/62 111/65 103/61  Pulse:  89 94 (!) 107  Resp:  16  16  Temp:  98.1 F (36.7 C)  99.8 F (37.7 C)  TempSrc:      SpO2:  90%  100%  Weight: 120.3 kg     Height:       No data found.   Intake/Output Summary (Last 24 hours) at 03/12/2023 1356 Last data filed at 03/12/2023 1100 Gross per 24 hour  Intake 570 ml  Output 2100 ml  Net -1530 ml   Filed Weights   03/10/23 0550 03/11/23 0502 03/12/23 0500  Weight: 115.5 kg 117.8 kg 120.3 kg    Exam:  GEN:  NAD SKIN: Warm and dry EYES: No pallor or icterus ENT: MMM CV: RRR PULM: CTA B ABD: soft, obese, NT, +BS CNS: AAO x 3, non focal EXT: Significant bilateral leg edema, no tenderness      Data Reviewed:   I have personally reviewed following labs and imaging studies:  Labs: Labs show the following:   Basic Metabolic Panel: Recent Labs  Lab 03/07/23 2036 03/08/23 0341 03/09/23 0434 03/10/23 0410  NA 136 135 137 136  K 3.8 3.9 3.3* 3.6  CL 98 98 102 103  CO2 27 24 25 26   GLUCOSE 108* 132* 115* 102*  BUN 23 20 27* 26*  CREATININE 0.84 0.73 0.79 0.68  CALCIUM 7.2* 7.4* 7.4* 7.3*  MG  --   --  2.0  --    GFR Estimated Creatinine Clearance: 88.1 mL/min (by C-G formula based on SCr of 0.68 mg/dL). Liver Function Tests: Recent Labs  Lab 03/07/23 2036  AST 23  ALT 15  ALKPHOS 168*  BILITOT 0.7  PROT 5.1*  ALBUMIN <1.5*   No results for input(s): "LIPASE", "AMYLASE" in the last 168 hours. No results for input(s): "AMMONIA" in the last 168 hours. Coagulation profile Recent Labs  Lab 03/07/23 2036  INR 1.9*    CBC: Recent Labs  Lab 03/07/23 2036 03/08/23 0515 03/09/23 0434 03/10/23 0410  WBC 10.3 21.3* 9.6 7.7  NEUTROABS 8.2*  --   --   --   HGB 8.6* 8.3* 8.4* 8.1*  HCT 26.5* 25.7* 25.0* 24.2*  MCV 87.5 86.0 83.3 83.4  PLT 585* 603* 595* 574*   Cardiac Enzymes: No results for input(s): "CKTOTAL", "CKMB", "CKMBINDEX", "TROPONINI" in the last 168 hours. BNP (last 3 results) No results for input(s): "PROBNP" in the last 8760 hours. CBG:  No results for input(s): "GLUCAP" in the last 168 hours. D-Dimer: No results for input(s): "DDIMER" in the last 72 hours. Hgb A1c: No results for input(s): "HGBA1C" in the last 72 hours. Lipid Profile: No results for input(s): "CHOL", "HDL", "LDLCALC", "TRIG", "CHOLHDL", "LDLDIRECT" in the last 72 hours. Thyroid function studies: No results for input(s): "TSH", "T4TOTAL", "T3FREE", "THYROIDAB" in the last 72  hours.  Invalid input(s): "FREET3" Anemia work up: No results for input(s): "VITAMINB12", "FOLATE", "FERRITIN", "TIBC", "IRON", "RETICCTPCT" in the last 72 hours. Sepsis Labs: Recent Labs  Lab 03/07/23 2035 03/07/23 2036 03/07/23 2251 03/07/23 2353 03/08/23 0515 03/09/23 0434 03/10/23 0410  WBC  --  10.3  --   --  21.3* 9.6 7.7  LATICACIDVEN 1.7  --  2.2* 1.5  --   --   --     Microbiology Recent Results (from the past 240 hours)  Resp panel by RT-PCR (RSV, Flu A&B, Covid) Anterior Nasal Swab     Status: None   Collection Time: 03/07/23  8:35 PM   Specimen: Anterior Nasal Swab  Result Value Ref Range Status   SARS Coronavirus 2 by RT PCR NEGATIVE NEGATIVE Final    Comment: (NOTE) SARS-CoV-2 target nucleic acids are NOT DETECTED.  The SARS-CoV-2 RNA is generally detectable in upper respiratory specimens during the acute phase of infection. The lowest concentration of SARS-CoV-2 viral copies this assay can detect is 138 copies/mL. A negative result does not preclude SARS-Cov-2 infection and should not be used as the sole basis for treatment or other patient management decisions. A negative result may occur with  improper specimen collection/handling, submission of specimen other than nasopharyngeal swab, presence of viral mutation(s) within the areas targeted by this assay, and inadequate number of viral copies(<138 copies/mL). A negative result must be combined with clinical observations, patient history, and epidemiological information. The expected result is Negative.  Fact Sheet for Patients:  BloggerCourse.com  Fact Sheet for Healthcare Providers:  SeriousBroker.it  This test is no t yet approved or cleared by the [place] FDA and  has been authorized for detection and/or diagnosis of SARS-CoV-2 by FDA under an Emergency Use Authorization (EUA). This EUA will remain  in effect (meaning this test can be used)  for the duration of the COVID-19 declaration under Section 564(b)(1) of the Act, 21 U.S.C.section 360bbb-3(b)(1), unless the authorization is terminated  or revoked sooner.       Influenza A by PCR NEGATIVE NEGATIVE Final   Influenza B by PCR NEGATIVE NEGATIVE Final    Comment: (NOTE) The Xpert Xpress SARS-CoV-2/FLU/RSV plus assay is intended as an aid in the diagnosis of influenza from Nasopharyngeal swab specimens and should not be used as a sole basis for treatment. Nasal washings and aspirates are unacceptable for Xpert Xpress SARS-CoV-2/FLU/RSV testing.  Fact Sheet for Patients: BloggerCourse.com  Fact Sheet for Healthcare Providers: SeriousBroker.it  This test is not yet approved or cleared by the [place] FDA and has been authorized for detection and/or diagnosis of SARS-CoV-2 by FDA under an Emergency Use Authorization (EUA). This EUA will remain in effect (meaning this test can be used) for the duration of the COVID-19 declaration under Section 564(b)(1) of the Act, 21 U.S.C. section 360bbb-3(b)(1), unless the authorization is terminated or revoked.     Resp Syncytial Virus by PCR NEGATIVE NEGATIVE Final    Comment: (NOTE) Fact Sheet for Patients: BloggerCourse.com  Fact Sheet for Healthcare Providers: SeriousBroker.it  This test is not yet approved or cleared by the  [place] [employer] trader and has been authorized for detection and/or diagnosis of SARS-CoV-2 by FDA under an TEFL teacher (EUA). This EUA will remain in effect (meaning this test can be used) for the duration of the COVID-19 declaration under Section 564(b)(1) of the Act, 21 U.S.C. section 360bbb-3(b)(1), unless the authorization is terminated or revoked.  Performed at [facility], [address] [place], [place] [address]   Blood Culture (routine x 2)     Status: None    Collection Time: 03/07/23  8:35 PM   Specimen: BLOOD  Result Value Ref Range Status   Specimen Description BLOOD BLOOD LEFT HAND  Final   Special Requests   Final    BOTTLES DRAWN AEROBIC AND ANAEROBIC Blood Culture results may not be optimal due to an excessive volume of blood received in culture bottles   Culture   Final    NO GROWTH 5 DAYS Performed at [facility], [address] [place], [place] [address]    Report Status 03/12/2023 FINAL  Final  Blood Culture (routine x 2)     Status: None   Collection Time: 03/07/23  8:36 PM   Specimen: BLOOD  Result Value Ref Range Status   Specimen Description BLOOD BLOOD LEFT ARM  Final   Special Requests   Final    BOTTLES DRAWN AEROBIC AND ANAEROBIC Blood Culture results may not be optimal due to an excessive volume of blood received in culture bottles   Culture   Final    NO GROWTH 5 DAYS Performed at [facility], [address] [place], [place] [address]    Report Status 03/12/2023 FINAL  Final  Urine Culture     Status: None   Collection Time: 03/07/23  9:26 PM   Specimen: Urine, Random  Result Value Ref Range Status   Specimen Description   Final    URINE, RANDOM Performed at [facility], [address] [place], [place] [address]    Special Requests   Final    NONE Reflexed from [id number] Performed at [facility], [address] [place], [place] [address]    Culture   Final    NO GROWTH Performed at [facility], [address], [place], [place] [address]    Report Status 03/09/2023 FINAL  Final    Procedures and diagnostic studies:  No results found.              LOS: 5 days   Jaeveon Ashland  [facility]   Pager on [email or web address]. If 7PM-7AM, please contact night-coverage at [email or web address]     03/12/2023, 1:56 PM

## 2023-03-12 NOTE — Progress Notes (Signed)
 Physical Therapy Treatment Patient Details Name: Wendy Sanchez MRN: [id number] DOB: Aug 27, 1950 Today's Date: 03/12/2023   History of Present Illness Pt is a 73 y.o. female admitted with sepsis, adrenal insufficiency, NSTEMI and recurrent UTI. PMH significant for COPD, T2 compression fx, OA, CHF with EF of 40-45%, depression, obesity, anemia, DVT and PE on Eliquis, temporal arteritis, renal cell carcinoma, adrenal insufficiency, s/p for colostomy secondary to perforated diverticulitis.    PT Comments  Patient is agreeable to PT. She continues to require assistance for bed mobility. Sitting balance is fair with occasional cues for anterior weight shifting. Activity tolerance limited for progression of standing. Heart rate 116-120 bpm with sitting activity. Patient is deconditioned and could benefit from continued PT to maximize independence and decrease caregiver burden. Rehabilitation < 3 hours/day recommended after this hospital stay.     If plan is discharge home, recommend the following: Two people to help with walking and/or transfers;A lot of help with bathing/dressing/bathroom;Help with stairs or ramp for entrance;Assist for transportation   Can travel by private vehicle     No  Equipment Recommendations    To be determined at next level of care    Recommendations for Other Services       Precautions / Restrictions Precautions Precautions: Fall Recall of Precautions/Restrictions: Intact Precaution/Restrictions Comments: monitor HR Restrictions Weight Bearing Restrictions Per Provider Order: No     Mobility  Bed Mobility Overal bed mobility: Needs Assistance Bed Mobility: Supine to Sit, Sit to Supine     Supine to sit: Mod assist, +2 for physical assistance, HOB elevated Sit to supine: Max assist, +2 for physical assistance   General bed mobility comments: increased assistance required for return to bed. cues for task initiation    Transfers                    General transfer comment: patient declined, activity tolerance limited by fatigue    Ambulation/Gait                   Stairs             Wheelchair Mobility     Tilt Bed    Modified Rankin (Stroke Patients Only)       Balance Overall balance assessment: Needs assistance Sitting-balance support: Feet supported Sitting balance-Leahy Scale: Fair Sitting balance - Comments: intermittent preiods of posterior lean with sitting. cues for anterior weight shifting.                                    Communication Communication Communication: No apparent difficulties  Cognition Arousal: Alert Behavior During Therapy: WFL for tasks assessed/performed   PT - Cognitive impairments: No apparent impairments                       PT - Cognition Comments: Patient seems motivated to participate Following commands: Intact      Cueing Cueing Techniques: Verbal cues  Exercises      General Comments General comments (skin integrity, edema, etc.): sp02 100% on 2L; HR 116-120 seated EOB, down to 103 with return to bed; no increased chest pain or tightness reported      Pertinent Vitals/Pain Pain Assessment Pain Assessment: Faces Faces Pain Scale: Hurts a little bit Pain Location: L hand IV site, headache Pain Descriptors / Indicators: Discomfort Pain Intervention(s): Repositioned, Limited activity within patient's tolerance, Monitored during  session    Home Living                          Prior Function            PT Goals (current goals can now be found in the care plan section) Acute Rehab PT Goals Patient Stated Goal: to get better PT Goal Formulation: With patient Time For Goal Achievement: 03/23/23 Potential to Achieve Goals: Good Progress towards PT goals: Progressing toward goals    Frequency    Min 1X/week      PT Plan      Co-evaluation PT/OT/SLP Co-Evaluation/Treatment: Yes Reason for Co-Treatment:  Complexity of the patient's impairments (multi-system involvement);For patient/therapist safety;To address functional/ADL transfers PT goals addressed during session: Mobility/safety with mobility OT goals addressed during session: ADL's and self-care      AM-PAC PT "6 Clicks" Mobility   Outcome Measure  Help needed turning from your back to your side while in a flat bed without using bedrails?: A Little Help needed moving from lying on your back to sitting on the side of a flat bed without using bedrails?: A Lot Help needed moving to and from a bed to a chair (including a wheelchair)?: Total Help needed standing up from a chair using your arms (e.g., wheelchair or bedside chair)?: A Lot Help needed to walk in hospital room?: Total Help needed climbing 3-5 steps with a railing? : Total 6 Click Score: 10    End of Session Equipment Utilized During Treatment: Oxygen Activity Tolerance: Patient limited by fatigue Patient left: in bed;with call bell/phone within reach;with bed alarm set   PT Visit Diagnosis: Muscle weakness (generalized) (M62.81);Unsteadiness on feet (R26.81);Difficulty in walking, not elsewhere classified (R26.2)     Time: 1610-9604 PT Time Calculation (min) (ACUTE ONLY): 23 min  Charges:    $Therapeutic Activity: 8-22 mins PT General Charges $$ ACUTE PT VISIT: 1 Visit                     Donna Bernard, PT, MPT    Ina Homes 03/12/2023, 3:00 PM

## 2023-03-12 NOTE — Progress Notes (Addendum)
 CCMD called and reported an episode of SVT twice HR up at 182. VSS. Pt complained of headache 4/10, PRN tylenol 650 mg administer. NP Jon Billings made aware. Will continue to monitor.  Update 0427: No new place order. Will continue to monitor.

## 2023-03-12 NOTE — Progress Notes (Signed)
 Occupational Therapy Treatment Patient Details Name: Wendy Sanchez MRN: [id number] DOB: [birth year] Today's Date: 03/12/2023   History of present illness Pt is a 73 y.o. female admitted with sepsis, adrenal insufficiency, NSTEMI and recurrent UTI. PMH significant for COPD, T2 compression fx, OA, CHF with EF of 40-45%, depression, obesity, anemia, DVT and PE on Eliquis, temporal arteritis, renal cell carcinoma, adrenal insufficiency, s/p for colostomy secondary to perforated diverticulitis.   OT comments  Pt is supine in bed on arrival. Pleasant and agreeable to PT/OT session. Spoke with cardiology PA prior to session to ensure okay to work with PT/OT. She reports soreness to her buttocks and chronic R RTC tear discomfort. Pt performed bed mobility with Max to Mod A x2 d/t weakness. Cueing and use of bed rails. HR up to 116-120 while seated EOB and pt needing Min to CGA for seated balance maintenance with intermittent R lateral posterior lean. Total assist for donning socks at bed level. Sp02 100% and HR down to 103 with return to bed.  Pt left with needs in place and will cont to require skilled acute OT services to maximize her safety and IND to return to PLOF.       If plan is discharge home, recommend the following:  Two people to help with walking and/or transfers;Two people to help with bathing/dressing/bathroom;Assistance with cooking/housework   Equipment Recommendations  Other (comment) (defer)    Recommendations for Other Services      Precautions / Restrictions Precautions Precautions: Fall Recall of Precautions/Restrictions: Intact Restrictions Weight Bearing Restrictions Per Provider Order: No       Mobility Bed Mobility Overal bed mobility: Needs Assistance Bed Mobility: Supine to Sit, Sit to Supine     Supine to sit: +2 for physical assistance, Mod assist, HOB elevated Sit to supine: Max assist, +2 for physical assistance   General bed mobility comments: Mod A x2  to reach EOB and Max A x2 to return to bed d/t weakness and body habitus    Transfers                   General transfer comment: deferred d/t HR up to 120 seated EOB     Balance Overall balance assessment: Needs assistance Sitting-balance support: Feet supported Sitting balance-Leahy Scale: Fair Sitting balance - Comments: intermittent preiods of R lateral posterior lean with sitting-Min progressing to CGA to maintain       Standing balance comment: deferred                           ADL either performed or assessed with clinical judgement   ADL Overall ADL's : Needs assistance/impaired                     Lower Body Dressing: Maximal assistance;Bed level;Total assistance Lower Body Dressing Details (indicate cue type and reason): to doff/don socks               General ADL Comments: Mod to Max A x2 for all bed mobility and Min progressing to CGA for maintaining seated balance at EOB with R lateral lean at times    Extremity/Trunk Assessment              Vision       Perception     Praxis     Communication Communication Communication: No apparent difficulties   Cognition Arousal: Alert Behavior During Therapy: [facility] for tasks assessed/performed  Following commands: Intact        Cueing   Cueing Techniques: Verbal cues  Exercises      Shoulder Instructions       General Comments sp02 100% on 2L; HR 116-120 seated EOB, down to 103 with return to bed; no increased chest pain or tightness reported    Pertinent Vitals/ Pain       Pain Assessment Pain Assessment: Faces Faces Pain Scale: Hurts a little bit Pain Location: R UE with active movement, sore buttocks Pain Descriptors / Indicators: Aching, Sore Pain Intervention(s): Monitored during session, Repositioned  Home Living                                          Prior Functioning/Environment               Frequency  Min 1X/week        Progress Toward Goals  OT Goals(current goals can now be found in the care plan section)  Progress towards OT goals: Progressing toward goals  Acute Rehab OT Goals OT Goal Formulation: With patient Time For Goal Achievement: 03/23/23 Potential to Achieve Goals: Fair  Plan      Co-evaluation    PT/OT/SLP Co-Evaluation/Treatment: Yes Reason for Co-Treatment: Complexity of the patient's impairments (multi-system involvement);For patient/therapist safety;To address functional/ADL transfers PT goals addressed during session: Mobility/safety with mobility OT goals addressed during session: ADL's and self-care      AM-PAC OT "6 Clicks" Daily Activity     Outcome Measure   Help from another person eating meals?: None Help from another person taking care of personal grooming?: A Little Help from another person toileting, which includes using toliet, bedpan, or urinal?: Total Help from another person bathing (including washing, rinsing, drying)?: Total Help from another person to put on and taking off regular upper body clothing?: A Little Help from another person to put on and taking off regular lower body clothing?: Total 6 Click Score: 13    End of Session Equipment Utilized During Treatment: Oxygen  OT Visit Diagnosis: Muscle weakness (generalized) (M62.81);Other abnormalities of gait and mobility (R26.89);Unsteadiness on feet (R26.81)   Activity Tolerance Patient tolerated treatment well;Patient limited by fatigue   Patient Left with call bell/phone within reach;in bed;with bed alarm set   Nurse Communication Mobility status        Time: 1610-9604 OT Time Calculation (min): 23 min  Charges: OT General Charges $OT Visit: 1 Visit OT Treatments $Therapeutic Activity: 8-22 mins  Delaynee Alred, OTR/L  03/12/23, 3:04 PM   Ianmichael Amescua E Trystin Terhune 03/12/2023, 3:01 PM

## 2023-03-12 NOTE — Consult Note (Signed)
 Consultation Note Date: 03/12/2023   Patient Name: Wendy Sanchez  DOB: 05/30/1950  MRN: [id number]  Age / Sex: 73 y.o., female  PCP: Pcp, No Referring Physician: Lurene Shadow, MD  Reason for Consultation: Establishing goals of care   HPI/Brief Hospital Course: 73 y.o. female  with past medical history of COPD, CHF with EF 35-40% on echo this admission, DVT and PE on Eliquis, perforated sigmoid diverticulitis s/p colostomy, giant cell arteritis on chronic prednisone and secondary adrenal insufficiency with chronic hypotension on Midodrine admitted from [facility] on 03/07/2023 with fever, AMS and increased urinary frequency.   Noted recent admit 1/17-1/23 for sepsis without obvious sign of infection and hypotension felt to be chronic and related to adrenal insufficiency  Recent hospitalization at [facility] 1/10-1/15 for E. Coli UTI and PNA--discharged at that time to [facility] for STR  Palliative medicine was consulted for assisting with goals of care conversations  Subjective:  Extensive chart review has been completed prior to meeting patient including labs, vital signs, imaging, progress notes, orders, and available advanced directive documents from current and previous encounters.  Visited with Ms. Mohammad at her bedside. She is awake and lying in bed, able to engage in conversations. She is speaking with her son-Brian and he remains available for visit.  Introduced myself as a Publishing rights manager as a member of the palliative care team. Explained palliative medicine is specialized medical care for people living with serious illness. It focuses on providing relief from the symptoms and stress of a serious illness. The goal is to improve quality of life for both the patient and the family.   Ms. Stepney shares she has 3 children in total but Arlys John is the only child she has contact with, her other two children are estranged and not involved in her life.  She also has a sister, Artist Pais that is involved and Ms. Fiser shares and confirmed by Arlys John that Artist Pais has been appointed as Product manager for Ms. Buhl.  Arlys John and Ms. Bonneau share their frustrations related to frequent recurrent hospitalizations. They both share concerns related to recurrent infections. Prior to [facility] admission, Ms. Toenjes was living at home with Arlys John. She required some assistance with day to day tasks. After initial discharge when she arrived at [facility] she felt she progressed well. She shares she has good and bad days as far as mobility.  Brain and Ms. Pena feel they have a good understanding of Ms. Hulme's current medical condition.  We discussed patient's current illness and what it means in the larger context of patient's on-going co-morbidities. Natural disease trajectory and expectations at EOL were discussed.   We discussed code status and the difference between Full Code and Do Not Resuscitate. Encouraged patient/family to consider DNR/DNI status understanding evidenced based poor outcomes in similar hospitalized patients, as the cause of the arrest is likely associated with chronic/terminal disease rather than a reversible acute cardio-pulmonary event.  Ms. Mazurkiewicz is clear she is open to all resuscitative measures, Full Code, but would not want to live long term on life preserving machines. Arlys John confirms his awareness of this and Ms. Sausedo shares her sister is also aware of her wishes.  Ms. Chisom shares she is concerned about discharge planning. Arlys John is no longer able to care for her in the home and her sister recently underwent an extensive surgery and is not able to care for her either. We discussed TOC and discharge planning.  I discussed importance of continued conversations with family/support persons  and all members of their medical team regarding overall plan of care and treatment options ensuring decisions are in alignment with patients goals of care.  All  questions/concerns addressed. Emotional support provided to patient/family/support persons. PMT will continue to follow and support patient as needed.  Objective: Primary Diagnoses: Present on Admission:  Chronic pain syndrome  HFrEF (heart failure with reduced ejection fraction) (HCC)  History of pulmonary embolus 2020(PE)  Chronic obstructive pulmonary disease (COPD) (HCC)  Secondary adrenal insufficiency (HCC)  Obesity (BMI 30-39.9)  Myocardial injury  Depression with anxiety  Hypotension   Physical Exam Constitutional:      General: She is not in acute distress.    Appearance: She is ill-appearing.  Pulmonary:     Effort: Pulmonary effort is normal. No respiratory distress.  Skin:    General: Skin is warm and dry.  Neurological:     Mental Status: She is alert and oriented to person, place, and time.     Motor: Weakness present.     Vital Signs: BP 103/61 (BP Location: Right Arm)   Pulse (!) 107   Temp 99.8 F (37.7 C)   Resp 16   Ht 5\' 8"  (1.727 m)   Wt 120.3 kg   SpO2 100%   BMI 40.33 kg/m  Pain Scale: 0-10   Pain Score: 5  IO: Intake/output summary:  Intake/Output Summary (Last 24 hours) at 03/12/2023 1653 Last data filed at 03/12/2023 1432 Gross per 24 hour  Intake 690 ml  Output 2100 ml  Net -1410 ml    LBM: Last BM Date : 03/11/23 Baseline Weight: Weight: 117.3 kg Most recent weight: Weight: 120.3 kg      Assessment and Plan  SUMMARY OF RECOMMENDATIONS   Full Code Time for outcomes Continue GOC discussions  Palliative Prophylaxis:   Bowel Regimen, Delirium Protocol and Frequent Pain Assessment   Thank you for this consult and allowing Palliative Medicine to participate in the care of Taraji T. Mearns. Palliative medicine will continue to follow and assist as needed.   Time Total: 75 minutes  Time spent includes: Detailed review of medical records (labs, imaging, vital signs), medically appropriate exam (mental status, respiratory,  cardiac, skin), discussed with treatment team, counseling and educating patient, family and staff, documenting clinical information, medication management and coordination of care.   Signed by: Leeanne Deed, DNP, AGNP-C Palliative Medicine    Please contact Palliative Medicine Team phone at [phone] for questions and concerns.  For individual provider: See Loretha Stapler

## 2023-03-13 DIAGNOSIS — E2749 Other adrenocortical insufficiency: Secondary | ICD-10-CM | POA: Diagnosis not present

## 2023-03-13 DIAGNOSIS — Z515 Encounter for palliative care: Secondary | ICD-10-CM | POA: Diagnosis not present

## 2023-03-13 DIAGNOSIS — I4719 Other supraventricular tachycardia: Secondary | ICD-10-CM

## 2023-03-13 DIAGNOSIS — I959 Hypotension, unspecified: Secondary | ICD-10-CM | POA: Diagnosis not present

## 2023-03-13 DIAGNOSIS — I5020 Unspecified systolic (congestive) heart failure: Secondary | ICD-10-CM | POA: Diagnosis not present

## 2023-03-13 LAB — BASIC METABOLIC PANEL
Anion gap: 7 (ref 5–15)
BUN: 19 mg/dL (ref 8–23)
CO2: 30 mmol/L (ref 22–32)
Calcium: 8.1 mg/dL — ABNORMAL LOW (ref 8.9–10.3)
Chloride: 99 mmol/L (ref 98–111)
Creatinine, Ser: 0.6 mg/dL (ref 0.44–1.00)
GFR, Estimated: >60 mL/min (ref >60)
Glucose, Bld: 83 mg/dL (ref 70–99)
Potassium: 3.5 mmol/L (ref 3.5–5.1)
Sodium: 136 mmol/L (ref 135–145)

## 2023-03-13 LAB — MAGNESIUM: Magnesium: 1.9 mg/dL (ref 1.7–2.4)

## 2023-03-13 MED ORDER — AMIODARONE HCL 200 MG PO TABS
400.0000 mg | ORAL_TABLET | Freq: Two times a day (BID) | ORAL | Status: DC
  Administered 2023-03-13 – 2023-03-15 (×5): 400 mg via ORAL
  Filled 2023-03-13 (×5): qty 2

## 2023-03-13 NOTE — Progress Notes (Signed)
 Rounding Note    Patient Name: Wendy Sanchez Date of Encounter: 03/13/2023  [facility] Cardiologist: Julien Nordmann, MD    Patient Profile     73 y.o. female with past medical history of arthritis, asthma, CHF, COPD, fibromyalgia, GERD, hyperlipidemia, hypertension, morbid obesity status post gastric banding, NICM, DVT/PE on apixaban, PVCs, OSA not on CPAP, frequent PVCs status post ablation, spinal stenosis, temporal arteritis, and vertigo, who is being seen for the evaluation of acute on chronic CHF.mangagment as been complicated by hypotension limiting GDMT  EF 30-35%  Largely bed bound but was at rehab taking a few steps  Subjective   Weak and SOB  Inpatient Medications    Scheduled Meds:  amiodarone  200 mg Oral BID   apixaban  5 mg Oral BID   ascorbic acid  500 mg Oral Daily   aspirin EC  81 mg Oral Daily   calcium carbonate  2 tablet Oral Q breakfast   cholecalciferol  1,000 Units Oral Daily   cyanocobalamin  1,000 mcg Oral Daily   famotidine  20 mg Oral BID WC   ferrous sulfate  325 mg Oral BID WC   fluticasone  1 spray Each Nare Daily   folic acid  1 mg Oral Daily   furosemide  20 mg Intravenous BID   gabapentin  300 mg Oral TID   hydrocortisone  15 mg Oral Q breakfast   hydrocortisone  5 mg Oral BID AC   lactose free nutrition  237 mL Oral TID BM   loratadine  10 mg Oral QPM   metoprolol succinate  12.5 mg Oral Daily   midodrine  15 mg Oral TID WC   montelukast  10 mg Oral Daily   nortriptyline  50 mg Oral QHS   pantoprazole  40 mg Oral Q breakfast   polyvinyl alcohol  1 drop Both Eyes TID   spironolactone  12.5 mg Oral Daily   thiamine  100 mg Oral Daily   vitamin A  10,000 Units Oral Daily   Continuous Infusions:  PRN Meds: oxyCODONE **AND** acetaminophen, acetaminophen, albuterol, dextromethorphan-guaiFENesin, diphenhydrAMINE, docusate sodium, melatonin, ondansetron (ZOFRAN) IV   Vital Signs    Vitals:   03/13/23 0420 03/13/23  0500 03/13/23 0800 03/13/23 0823  BP: 96/69  (!) 80/61 (!) 88/58  Pulse:   96   Resp: 18     Temp: 99.1 F (37.3 C)  98.4 F (36.9 C)   TempSrc: Oral  Oral   SpO2: 100%  100%   Weight:  119.4 kg    Height:        Intake/Output Summary (Last 24 hours) at 03/13/2023 1101 Last data filed at 03/12/2023 2144 Gross per 24 hour  Intake 360 ml  Output 600 ml  Net -240 ml      03/13/2023    5:00 AM 03/12/2023    5:00 AM 03/11/2023    5:02 AM  Last 3 Weights  Weight (lbs) 263 lb 3.7 oz 265 lb 3.4 oz 259 lb 11.2 oz  Weight (kg) 119.4 kg 120.3 kg 117.8 kg      Telemetry    Sinus with PVC VT NS 2/27 3-5/min   ECG    No new tracings- Personally Reviewed  Physical Exam   Well developed and nourished in no acute distress HENT normal Neck supple   Clear Regular rate and rhythm, no murmurs or gallops Abd-soft with active BS No Clubbing cyanosis edema Skin-warm and dry A & Oriented  Grossly normal sensory and motor function Carpal tunnel     Labs    High Sensitivity Troponin:   Recent Labs  Lab 03/07/23 2251 03/07/23 2353 03/08/23 0341 03/08/23 0515 03/08/23 1054  TROPONINIHS 251* 241* 150* 113* 61*     Chemistry Recent Labs  Lab 03/07/23 2036 03/08/23 0341 03/09/23 0434 03/10/23 0410 03/13/23 0511  NA 136   < > 137 136 136  K 3.8   < > 3.3* 3.6 3.5  CL 98   < > 102 103 99  CO2 27   < > 25 26 30   GLUCOSE 108*   < > 115* 102* 83  BUN 23   < > 27* 26* 19  CREATININE 0.84   < > 0.79 0.68 0.60  CALCIUM 7.2*   < > 7.4* 7.3* 8.1*  MG  --   --  2.0  --  1.9  PROT 5.1*  --   --   --   --   ALBUMIN <1.5*  --   --   --   --   AST 23  --   --   --   --   ALT 15  --   --   --   --   ALKPHOS 168*  --   --   --   --   BILITOT 0.7  --   --   --   --   GFRNONAA >60   < > >60 >60 >60  ANIONGAP 11   < > 10 7 7    < > = values in this interval not displayed.    Lipids  Recent Labs  Lab 03/08/23 0341  CHOL 97  TRIG 123  HDL 27*  LDLCALC 45  CHOLHDL 3.6     Hematology Recent Labs  Lab 03/08/23 0515 03/09/23 0434 03/10/23 0410  WBC 21.3* 9.6 7.7  RBC 2.99* 3.00* 2.90*  HGB 8.3* 8.4* 8.1*  HCT 25.7* 25.0* 24.2*  MCV 86.0 83.3 83.4  MCH 27.8 28.0 27.9  MCHC 32.3 33.6 33.5  RDW 16.6* 16.2* 16.8*  PLT 603* 595* 574*   Thyroid No results for input(s): "TSH", "FREET4" in the last 168 hours.  BNP Recent Labs  Lab 03/07/23 2035  BNP 193.3*    DDimer No results for input(s): "DDIMER" in the last 168 hours.   Radiology    No results found.  Cardiac Studies   03/09/2023 Echo complete 1. Left ventricular ejection fraction, by estimation, is 35 to 40%. The  left ventricle has moderately decreased function. The left ventricle  demonstrates global hypokinesis. The left ventricular internal cavity size  was severely dilated. There is mild  left ventricular hypertrophy. Left ventricular diastolic parameters are  consistent with Grade I diastolic dysfunction (impaired relaxation).  Elevated left atrial pressure. The average left ventricular global  longitudinal strain is -8.6 %. The global  longitudinal strain is abnormal.   2. Right ventricular systolic function is normal. The right ventricular  size is mildly enlarged. There is normal pulmonary artery systolic  pressure.   3. Left atrial size was mildly dilated.   4. A small pericardial effusion is present. The pericardial effusion is  posterior to the left ventricle.   5. The mitral valve is degenerative. Mild mitral valve regurgitation. No  evidence of mitral stenosis.   6. Tricuspid valve regurgitation is mild to moderate.   7. The aortic valve is tricuspid. There is moderate calcification of the  aortic valve. There is moderate thickening of  the aortic valve. Aortic  valve regurgitation is trivial. Mild aortic valve stenosis. Aortic valve  mean gradient measures 10.0 mmHg.   8. There is mild dilatation of the ascending aorta, measuring 38 mm.   9. The inferior vena cava is  dilated in size with >50% respiratory  variability, suggesting right atrial pressure of 8 mmHg.            For questions or updates, please contact [facility] Please consult [email or web address] for contact info under   HFrEF A/C  EF 35-40%  SVT/PVC  prior ablation>>recurrences   Hypotension chronic on outpt midodrine  TN elevation thought 2/2 mismatch  DVT/PE --  history  Anemia chronic   Thenar weakness     Continue   low grade diuresis  Agree with DR CE, midodrine is not a good long term solution .Marland Kitchen Will measure orhtostatics but non pharmacolgoical therapies including raising HOB and compression and perhaps even pyridostigmine are preferable  Needs ACTH stimulation but needs to be off steroids todo this -- to understand hypotension nowtwithstanding long term    W recurrent SVT >> 180 continue amio with some trepidation 2/2 its potential to aggravate orthostatic hypotension-- will increase to 400 f2/2 frequeent recurrences   Thenar weakness and chronic hypotension   >> PYP scan       Signed, Sherryl Manges, MD  03/13/2023, 11:01 AM

## 2023-03-13 NOTE — Progress Notes (Signed)
                                                                                                                                                                                                           Daily Progress Note   Patient Name: Wendy Sanchez       Date: 03/13/2023 DOB: 1950/11/30  Age: 73 y.o. MRN#: [id number] Attending Physician: Lurene Shadow, MD Primary Care Physician: Pcp, No Admit Date: 03/07/2023  Reason for Consultation/Follow-up: Establishing goals of care  HPI/Brief Hospital Review:  73 y.o. female  with past medical history of COPD, CHF with EF 35-40% on echo this admission, DVT and PE on Eliquis, perforated sigmoid diverticulitis s/p colostomy, giant cell arteritis on chronic prednisone and secondary adrenal insufficiency with chronic hypotension on Midodrine admitted from [facility] on 03/07/2023 with fever, AMS and increased urinary frequency.    Noted recent admit 1/17-1/23 for sepsis without obvious sign of infection and hypotension felt to be chronic and related to adrenal insufficiency   Recent hospitalization at [facility] 1/10-1/15 for E. Coli UTI and PNA--discharged at that time to [facility] for STR   Palliative medicine was consulted for assisting with goals of care conversations  Subjective: Extensive chart review has been completed prior to meeting patient including labs, vital signs, imaging, progress notes, orders, and available advanced directive documents from current and previous encounters.    Visited with Ms. Mainwaring at her bedside. She is resting in bed, she awakens and acknowledges my presence in room. She requests I return at a later time as she wants to rest. No family at bedside during time of visit.  Will attempt visit at a later time. Will recommend ongoing conversations related to code status and recommend outpatient palliative care referral.  Thank you for allowing the Palliative Medicine Team to assist in the care of this  patient.  Total time:  25 minutes  Time spent includes: Detailed review of medical records (labs, imaging, vital signs), medically appropriate exam (mental status, respiratory, cardiac, skin), discussed with treatment team, counseling and educating patient, family and staff, documenting clinical information, medication management and coordination of care.  Leeanne Deed, DNP, AGNP-C Palliative Medicine   Please contact Palliative Medicine Team phone at [phone] for questions and concerns.

## 2023-03-13 NOTE — Progress Notes (Signed)
 Progress Note    Wendy Sanchez  ZDG:[id number] DOB: 01-26-1950  DOA: 03/07/2023 PCP: Pcp, No      Brief Narrative:    Medical records reviewed and are as summarized below:  Wendy Sanchez is a 73 y.o. female  with medical history significant of COPD, CHF with EF of 40-45%, depression, obesity, anemia, DVT and PE on Eliquis, temporal arteritis, renal cell carcinoma, adrenal insufficiency, s/p for colostomy secondary to perforated diverticulitis, who presented to the hospital with fever and increased urinary frequency.  Patient was found to have hypotension with blood pressure 86/59, which improved to 101/70 after giving 2 L LR bolus in ED, but blood pressure  dropped again to 82/61 with MAP of 69.  WBC 10.3, GFR> 60, negative PCR for COVID, flu and RSV, UA (hazy appearance, small amount of leukocyte, rare bacteria, WBC 11-20), lactic acid of 1.7 --> 2.2 --> 1.5, troponin 217 --> 251. Chest x-ray negative for infiltration.    2/24: Blood pressure with some improvement to 97/63, patient was started on midodrine and stress doses of steroid.  Elevated leukocytosis today likely secondary to stress doses of steroid.  Preliminary blood cultures negative.  Urine cultures pending.  Troponin peaked at 251 and now trending down.   Family is very concerned about this recurrent UTI, patient has multiple hospitalization for this and has been seen outpatient urology, there were recommending continuing antibiotics at suppressive doses.   2/25: Vital stable.  Blood pressure seems within goal now.  Mild hypokalemia which are being repleted.  Blood cultures remain negative, urine cultures with no growth.  Stopping antibiotics.  Sepsis ruled out.  Increasing the dose of midodrine. Had an episode of sinus tachycardia, restarting home metoprolol and torsemide.  PT is recommending SNF, not sure if she has any days left   2/26: Vital stable with blood pressure in low 100s.  Echo with EF of 35 to 40%, left  ventricular global hypokinesis and severely dilated cavity, grade 1 diastolic dysfunction.  Small pericardial effusion. Patient currently stable to go back to her facility, she needs another insurance authorization which was started today.  She will go back once insurance authorization got approval.  Palliative care team was consulted because of multiple comorbidities and high risk for readmissions and mortality     Assessment/Plan:   Principal Problem:   Hypotension Active Problems:   Secondary adrenal insufficiency (HCC)   Myocardial injury   Chronic obstructive pulmonary disease (COPD) (HCC)   Chronic pain syndrome   HFrEF (heart failure with reduced ejection fraction) (HCC)   History of pulmonary embolus 2020(PE)   Depression with anxiety   Obesity (BMI 30-39.9)   Pressure injury of skin   Sepsis (HCC)   Nonischemic cardiomyopathy (HCC)   Acute on chronic HFrEF (heart failure with reduced ejection fraction) (HCC)   SVT (supraventricular tachycardia) (HCC)    Body mass index is 40.02 kg/m.  (Class II obesity)    Hypotension, secondary adrenal insufficiency: Continue midodrine and hydrocortisone. S/p treatment with stress doses of IV steroids. Sepsis has been ruled out.  No evidence of UTI or pneumonia.   Acute on chronic HFrEF, valvular heart disease (mild to moderate TR, mild MR, mild aortic stenosis): Continue IV Lasix if BP allows. Follow-up with cardiologist. BNP was 193.3 2D echo showed EF estimated at 35 to 40%, mild LVH, grade 1 diastolic dysfunction mildly enlarged RV size, mildly dilated left atrium size, small pericardial effusion, mild MR, mild to moderate TR,  mild aortic stenosis Previous 2D echo in September 2024 showed EF estimated at 40 to 45% History of low EF of 20% in 2019 thought to be due to frequent PVCs but she did not have significant improvement in EF following PVC ablation at that time.   Elevated troponins: Troponin was 251 on admission and  dropped to 61.   She has been seen by the cardiologist.  No plan for ischemic workup at this time. She has a history of elevated troponins   Paroxysmal SVT: Continue Toprol-XL and amiodarone.   History of pulmonary embolism in 2020: Continue Eliquis   Stage II decubitus ulcers on bilateral ischial tuberosity (present on admission): Continue local wound care   Comorbidities include COPD, chronic pain syndrome, depression, anxiety,      Diet Order             DIET DYS 3 Room service appropriate? Yes; Fluid consistency: Thin  Diet effective now                            Consultants: Cardiologist  Procedures: None    Medications:    amiodarone  400 mg Oral BID   apixaban  5 mg Oral BID   ascorbic acid  500 mg Oral Daily   aspirin EC  81 mg Oral Daily   calcium carbonate  2 tablet Oral Q breakfast   cholecalciferol  1,000 Units Oral Daily   cyanocobalamin  1,000 mcg Oral Daily   famotidine  20 mg Oral BID WC   ferrous sulfate  325 mg Oral BID WC   fluticasone  1 spray Each Nare Daily   folic acid  1 mg Oral Daily   furosemide  20 mg Intravenous BID   gabapentin  300 mg Oral TID   hydrocortisone  15 mg Oral Q breakfast   hydrocortisone  5 mg Oral BID AC   lactose free nutrition  237 mL Oral TID BM   loratadine  10 mg Oral QPM   metoprolol succinate  12.5 mg Oral Daily   midodrine  15 mg Oral TID WC   montelukast  10 mg Oral Daily   nortriptyline  50 mg Oral QHS   pantoprazole  40 mg Oral Q breakfast   polyvinyl alcohol  1 drop Both Eyes TID   spironolactone  12.5 mg Oral Daily   thiamine  100 mg Oral Daily   vitamin A  10,000 Units Oral Daily   Continuous Infusions:   Anti-infectives (From admission, onward)    Start     Dose/Rate Route Frequency Ordered Stop   03/08/23 2100  vancomycin (VANCOREADY) IVPB 1500 mg/300 mL  Status:  Discontinued        1,500 mg 150 mL/hr over 120 Minutes Intravenous Every 24 hours 03/08/23 0111 03/08/23  0711   03/08/23 2100  vancomycin (VANCOREADY) IVPB 1750 mg/350 mL  Status:  Discontinued        1,750 mg 175 mL/hr over 120 Minutes Intravenous Every 24 hours 03/08/23 0711 03/09/23 1122   03/08/23 0400  meropenem (MERREM) 1 g in sodium chloride 0.9 % 100 mL IVPB  Status:  Discontinued        1 g 200 mL/hr over 30 Minutes Intravenous Every 8 hours 03/08/23 0013 03/09/23 1122   03/08/23 0015  vancomycin (VANCOREADY) IVPB 1500 mg/300 mL        1,500 mg 150 mL/hr over 120 Minutes Intravenous  Once 03/08/23 0004 03/08/23  1478   03/07/23 2045  ceFEPIme (MAXIPIME) 2 g in sodium chloride 0.9 % 100 mL IVPB        2 g 200 mL/hr over 30 Minutes Intravenous  Once 03/07/23 2032 03/07/23 2105   03/07/23 2045  vancomycin (VANCOCIN) IVPB 1000 mg/200 mL premix        1,000 mg 200 mL/hr over 60 Minutes Intravenous  Once 03/07/23 2032 03/07/23 2207              Family Communication/Anticipated D/C date and plan/Code Status   DVT prophylaxis:  apixaban (ELIQUIS) tablet 5 mg     Code Status: Full Code  Family Communication: None Disposition Plan: Plan to discharge to SNF   Status is: Inpatient Remains inpatient appropriate because: CHF exacerbation       Subjective:   Interval events noted.  She feels better today.  She said her breathing actually gets better when she lays down flat but she feels short of breath when she sits up or moves around a little bit.  Objective:    Vitals:   03/13/23 0500 03/13/23 0800 03/13/23 0823 03/13/23 1212  BP:  (!) 80/61 (!) 88/58 (!) 85/60  Pulse:  96  92  Resp:      Temp:  98.4 F (36.9 C)  97.6 F (36.4 C)  TempSrc:  Oral  Oral  SpO2:  100%  100%  Weight: 119.4 kg     Height:       No data found.   Intake/Output Summary (Last 24 hours) at 03/13/2023 1217 Last data filed at 03/12/2023 2144 Gross per 24 hour  Intake 360 ml  Output 600 ml  Net -240 ml   Filed Weights   03/11/23 0502 03/12/23 0500 03/13/23 0500  Weight: 117.8 kg  120.3 kg 119.4 kg    Exam:  GEN: NAD SKIN: Warm and dry EYES: No pallor or icterus ENT: MMM CV: RRR PULM: CTA B ABD: soft, obese, NT, +BS CNS: AAO x 3, non focal EXT: Bilateral lower extremity edema    Data Reviewed:   I have personally reviewed following labs and imaging studies:  Labs: Labs show the following:   Basic Metabolic Panel: Recent Labs  Lab 03/07/23 2036 03/08/23 0341 03/09/23 0434 03/10/23 0410 03/13/23 0511  NA 136 135 137 136 136  K 3.8 3.9 3.3* 3.6 3.5  CL 98 98 102 103 99  CO2 27 24 25 26 30   GLUCOSE 108* 132* 115* 102* 83  BUN 23 20 27* 26* 19  CREATININE 0.84 0.73 0.79 0.68 0.60  CALCIUM 7.2* 7.4* 7.4* 7.3* 8.1*  MG  --   --  2.0  --  1.9   GFR Estimated Creatinine Clearance: 87.7 mL/min (by C-G formula based on SCr of 0.6 mg/dL). Liver Function Tests: Recent Labs  Lab 03/07/23 2036  AST 23  ALT 15  ALKPHOS 168*  BILITOT 0.7  PROT 5.1*  ALBUMIN <1.5*   No results for input(s): "LIPASE", "AMYLASE" in the last 168 hours. No results for input(s): "AMMONIA" in the last 168 hours. Coagulation profile Recent Labs  Lab 03/07/23 2036  INR 1.9*    CBC: Recent Labs  Lab 03/07/23 2036 03/08/23 0515 03/09/23 0434 03/10/23 0410  WBC 10.3 21.3* 9.6 7.7  NEUTROABS 8.2*  --   --   --   HGB 8.6* 8.3* 8.4* 8.1*  HCT 26.5* 25.7* 25.0* 24.2*  MCV 87.5 86.0 83.3 83.4  PLT 585* 603* 595* 574*   Cardiac Enzymes: No  results for input(s): "CKTOTAL", "CKMB", "CKMBINDEX", "TROPONINI" in the last 168 hours. BNP (last 3 results) No results for input(s): "PROBNP" in the last 8760 hours. CBG: No results for input(s): "GLUCAP" in the last 168 hours. D-Dimer: No results for input(s): "DDIMER" in the last 72 hours. Hgb A1c: No results for input(s): "HGBA1C" in the last 72 hours. Lipid Profile: No results for input(s): "CHOL", "HDL", "LDLCALC", "TRIG", "CHOLHDL", "LDLDIRECT" in the last 72 hours. Thyroid function studies: No results for  input(s): "TSH", "T4TOTAL", "T3FREE", "THYROIDAB" in the last 72 hours.  Invalid input(s): "FREET3" Anemia work up: No results for input(s): "VITAMINB12", "FOLATE", "FERRITIN", "TIBC", "IRON", "RETICCTPCT" in the last 72 hours. Sepsis Labs: Recent Labs  Lab 03/07/23 2035 03/07/23 2036 03/07/23 2251 03/07/23 2353 03/08/23 0515 03/09/23 0434 03/10/23 0410  WBC  --  10.3  --   --  21.3* 9.6 7.7  LATICACIDVEN 1.7  --  2.2* 1.5  --   --   --     Microbiology Recent Results (from the past 240 hours)  Resp panel by RT-PCR (RSV, Flu A&B, Covid) Anterior Nasal Swab     Status: None   Collection Time: 03/07/23  8:35 PM   Specimen: Anterior Nasal Swab  Result Value Ref Range Status   SARS Coronavirus 2 by RT PCR NEGATIVE NEGATIVE Final    Comment: (NOTE) SARS-CoV-2 target nucleic acids are NOT DETECTED.  The SARS-CoV-2 RNA is generally detectable in upper respiratory specimens during the acute phase of infection. The lowest concentration of SARS-CoV-2 viral copies this assay can detect is 138 copies/mL. A negative result does not preclude SARS-Cov-2 infection and should not be used as the sole basis for treatment or other patient management decisions. A negative result may occur with  improper specimen collection/handling, submission of specimen other than nasopharyngeal swab, presence of viral mutation(s) within the areas targeted by this assay, and inadequate number of viral copies(<138 copies/mL). A negative result must be combined with clinical observations, patient history, and epidemiological information. The expected result is Negative.  Fact Sheet for Patients:  BloggerCourse.com  Fact Sheet for Healthcare Providers:  SeriousBroker.it  This test is no t yet approved or cleared by the [place] FDA and  has been authorized for detection and/or diagnosis of SARS-CoV-2 by FDA under an Emergency Use Authorization (EUA).  This EUA will remain  in effect (meaning this test can be used) for the duration of the COVID-19 declaration under Section 564(b)(1) of the Act, 21 U.S.C.section 360bbb-3(b)(1), unless the authorization is terminated  or revoked sooner.       Influenza A by PCR NEGATIVE NEGATIVE Final   Influenza B by PCR NEGATIVE NEGATIVE Final    Comment: (NOTE) The Xpert Xpress SARS-CoV-2/FLU/RSV plus assay is intended as an aid in the diagnosis of influenza from Nasopharyngeal swab specimens and should not be used as a sole basis for treatment. Nasal washings and aspirates are unacceptable for Xpert Xpress SARS-CoV-2/FLU/RSV testing.  Fact Sheet for Patients: BloggerCourse.com  Fact Sheet for Healthcare Providers: SeriousBroker.it  This test is not yet approved or cleared by the [place] FDA and has been authorized for detection and/or diagnosis of SARS-CoV-2 by FDA under an Emergency Use Authorization (EUA). This EUA will remain in effect (meaning this test can be used) for the duration of the COVID-19 declaration under Section 564(b)(1) of the Act, 21 U.S.C. section 360bbb-3(b)(1), unless the authorization is terminated or revoked.     Resp Syncytial Virus by PCR NEGATIVE NEGATIVE Final  Comment: (NOTE) Fact Sheet for Patients: BloggerCourse.com  Fact Sheet for Healthcare Providers: SeriousBroker.it  This test is not yet approved or cleared by the [place] FDA and has been authorized for detection and/or diagnosis of SARS-CoV-2 by FDA under an Emergency Use Authorization (EUA). This EUA will remain in effect (meaning this test can be used) for the duration of the COVID-19 declaration under Section 564(b)(1) of the Act, 21 U.S.C. section 360bbb-3(b)(1), unless the authorization is terminated or revoked.  Performed at [facility], [address]  [place], [place] [address]   Blood Culture (routine x 2)     Status: None   Collection Time: 03/07/23  8:35 PM   Specimen: BLOOD  Result Value Ref Range Status   Specimen Description BLOOD BLOOD LEFT HAND  Final   Special Requests   Final    BOTTLES DRAWN AEROBIC AND ANAEROBIC Blood Culture results may not be optimal due to an excessive volume of blood received in culture bottles   Culture   Final    NO GROWTH 5 DAYS Performed at [facility], [address] [place], [place] [address]    Report Status 03/12/2023 FINAL  Final  Blood Culture (routine x 2)     Status: None   Collection Time: 03/07/23  8:36 PM   Specimen: BLOOD  Result Value Ref Range Status   Specimen Description BLOOD BLOOD LEFT ARM  Final   Special Requests   Final    BOTTLES DRAWN AEROBIC AND ANAEROBIC Blood Culture results may not be optimal due to an excessive volume of blood received in culture bottles   Culture   Final    NO GROWTH 5 DAYS Performed at [facility], [address], [place], [place] [address]    Report Status 03/12/2023 FINAL  Final  Urine Culture     Status: None   Collection Time: 03/07/23  9:26 PM   Specimen: Urine, Random  Result Value Ref Range Status   Specimen Description   Final    URINE, RANDOM Performed at [facility], [address], [place], [place] [address]    Special Requests   Final    NONE Reflexed from [id number] Performed at [facility], [address] [place], [place] [address]    Culture   Final    NO GROWTH Performed at [facility], [address], [place], [place] [address]    Report Status 03/09/2023 FINAL  Final    Procedures and diagnostic studies:  No results found.              LOS: 6 days   Guhan Bruington  [facility]   Pager on [email or web address]. If 7PM-7AM, please contact night-coverage at [email or web address]     03/13/2023, 12:17 PM

## 2023-03-14 DIAGNOSIS — I5020 Unspecified systolic (congestive) heart failure: Secondary | ICD-10-CM | POA: Diagnosis not present

## 2023-03-14 DIAGNOSIS — Z515 Encounter for palliative care: Secondary | ICD-10-CM | POA: Diagnosis not present

## 2023-03-14 DIAGNOSIS — I959 Hypotension, unspecified: Secondary | ICD-10-CM | POA: Diagnosis not present

## 2023-03-14 DIAGNOSIS — E2749 Other adrenocortical insufficiency: Secondary | ICD-10-CM | POA: Diagnosis not present

## 2023-03-14 MED ORDER — PYRIDOSTIGMINE BROMIDE 60 MG PO TABS
30.0000 mg | ORAL_TABLET | Freq: Two times a day (BID) | ORAL | Status: DC
Start: 2023-03-14 — End: 2023-03-27
  Administered 2023-03-14 – 2023-03-27 (×25): 30 mg via ORAL
  Filled 2023-03-14 (×27): qty 0.5

## 2023-03-14 NOTE — Plan of Care (Signed)

## 2023-03-14 NOTE — Progress Notes (Signed)
 Rounding Note    Patient Name: Wendy Sanchez Date of Encounter: 03/14/2023  [facility] Cardiologist: Julien Nordmann, MD    Patient Profile     73 y.o. female with past medical history of arthritis, asthma, CHF, COPD, fibromyalgia, GERD, hyperlipidemia, hypertension, morbid obesity status post gastric banding, NICM, DVT/PE on apixaban, PVCs, OSA not on CPAP, frequent PVCs status post ablation, spinal stenosis, temporal arteritis, and vertigo, who is being seen for the evaluation of acute on chronic CHF.mangagment as been complicated by hypotension limiting GDMT  EF 30-35% Recurrent tachypalpitations >> amio  Largely bed bound but was at rehab taking a few steps  Subjective   Still with shortness of breath but no palps Edematous no chest pain   Inpatient Medications    Scheduled Meds:  amiodarone  400 mg Oral BID   apixaban  5 mg Oral BID   ascorbic acid  500 mg Oral Daily   aspirin EC  81 mg Oral Daily   calcium carbonate  2 tablet Oral Q breakfast   cholecalciferol  1,000 Units Oral Daily   cyanocobalamin  1,000 mcg Oral Daily   famotidine  20 mg Oral BID WC   ferrous sulfate  325 mg Oral BID WC   fluticasone  1 spray Each Nare Daily   folic acid  1 mg Oral Daily   furosemide  20 mg Intravenous BID   gabapentin  300 mg Oral TID   hydrocortisone  15 mg Oral Q breakfast   hydrocortisone  5 mg Oral BID AC   lactose free nutrition  237 mL Oral TID BM   loratadine  10 mg Oral QPM   metoprolol succinate  12.5 mg Oral Daily   midodrine  15 mg Oral TID WC   montelukast  10 mg Oral Daily   nortriptyline  50 mg Oral QHS   pantoprazole  40 mg Oral Q breakfast   polyvinyl alcohol  1 drop Both Eyes TID   spironolactone  12.5 mg Oral Daily   thiamine  100 mg Oral Daily   vitamin A  10,000 Units Oral Daily   Continuous Infusions:  PRN Meds: oxyCODONE **AND** acetaminophen, acetaminophen, albuterol, dextromethorphan-guaiFENesin, diphenhydrAMINE, docusate sodium,  melatonin, ondansetron (ZOFRAN) IV   Vital Signs    Vitals:   03/13/23 2156 03/14/23 0057 03/14/23 0500 03/14/23 0924  BP: 119/73 98/63 105/73 96/65  Pulse: 84 90 84 92  Resp: 15 15  18   Temp: 98.3 F (36.8 C) 98.5 F (36.9 C) 98 F (36.7 C) (!) 97.5 F (36.4 C)  TempSrc:   Oral Oral  SpO2: 100% 100% 100% 94%  Weight:   121.2 kg   Height:        Intake/Output Summary (Last 24 hours) at 03/14/2023 0952 Last data filed at 03/14/2023 0500 Gross per 24 hour  Intake 360 ml  Output 1700 ml  Net -1340 ml      03/14/2023    5:00 AM 03/13/2023    5:00 AM 03/12/2023    5:00 AM  Last 3 Weights  Weight (lbs) 267 lb 3.2 oz 263 lb 3.7 oz 265 lb 3.4 oz  Weight (kg) 121.2 kg 119.4 kg 120.3 kg      Telemetry    Sinus with no interval atrial tachycardia.  ECG    No new tracings- Personally Reviewed  Physical Exam    Well developed and Morbidly obese in no acute distress HENT normal Neck supple  JVP unable to determine  Clear  Device pocket well healed; without hematoma or erythema.  There is no tethering   Regular rate and rhythm, no  murmur Abd-soft with active BS No Clubbing cyanosis 3-4+ edema Skin-warm and dry A & Oriented  Grossly normal sensory and motor function       Labs    High Sensitivity Troponin:   Recent Labs  Lab 03/07/23 2251 03/07/23 2353 03/08/23 0341 03/08/23 0515 03/08/23 1054  TROPONINIHS 251* 241* 150* 113* 61*     Chemistry Recent Labs  Lab 03/07/23 2036 03/08/23 0341 03/09/23 0434 03/10/23 0410 03/13/23 0511  NA 136   < > 137 136 136  K 3.8   < > 3.3* 3.6 3.5  CL 98   < > 102 103 99  CO2 27   < > 25 26 30   GLUCOSE 108*   < > 115* 102* 83  BUN 23   < > 27* 26* 19  CREATININE 0.84   < > 0.79 0.68 0.60  CALCIUM 7.2*   < > 7.4* 7.3* 8.1*  MG  --   --  2.0  --  1.9  PROT 5.1*  --   --   --   --   ALBUMIN <1.5*  --   --   --   --   AST 23  --   --   --   --   ALT 15  --   --   --   --   ALKPHOS 168*  --   --   --   --   BILITOT 0.7   --   --   --   --   GFRNONAA >60   < > >60 >60 >60  ANIONGAP 11   < > 10 7 7    < > = values in this interval not displayed.    Lipids  Recent Labs  Lab 03/08/23 0341  CHOL 97  TRIG 123  HDL 27*  LDLCALC 45  CHOLHDL 3.6    Hematology Recent Labs  Lab 03/08/23 0515 03/09/23 0434 03/10/23 0410  WBC 21.3* 9.6 7.7  RBC 2.99* 3.00* 2.90*  HGB 8.3* 8.4* 8.1*  HCT 25.7* 25.0* 24.2*  MCV 86.0 83.3 83.4  MCH 27.8 28.0 27.9  MCHC 32.3 33.6 33.5  RDW 16.6* 16.2* 16.8*  PLT 603* 595* 574*   Thyroid No results for input(s): "TSH", "FREET4" in the last 168 hours.  BNP Recent Labs  Lab 03/07/23 2035  BNP 193.3*    DDimer No results for input(s): "DDIMER" in the last 168 hours.   Radiology    No results found.  Cardiac Studies   03/09/2023 Echo complete 1. Left ventricular ejection fraction, by estimation, is 35 to 40%. The  left ventricle has moderately decreased function. The left ventricle  demonstrates global hypokinesis. The left ventricular internal cavity size  was severely dilated. There is mild  left ventricular hypertrophy. Left ventricular diastolic parameters are  consistent with Grade I diastolic dysfunction (impaired relaxation).  Elevated left atrial pressure. The average left ventricular global  longitudinal strain is -8.6 %. The global  longitudinal strain is abnormal.   2. Right ventricular systolic function is normal. The right ventricular  size is mildly enlarged. There is normal pulmonary artery systolic  pressure.   3. Left atrial size was mildly dilated.   4. A small pericardial effusion is present. The pericardial effusion is  posterior to the left ventricle.   5. The mitral valve is degenerative. Mild mitral valve regurgitation.  No  evidence of mitral stenosis.   6. Tricuspid valve regurgitation is mild to moderate.   7. The aortic valve is tricuspid. There is moderate calcification of the  aortic valve. There is moderate thickening of the  aortic valve. Aortic  valve regurgitation is trivial. Mild aortic valve stenosis. Aortic valve  mean gradient measures 10.0 mmHg.   8. There is mild dilatation of the ascending aorta, measuring 38 mm.   9. The inferior vena cava is dilated in size with >50% respiratory  variability, suggesting right atrial pressure of 8 mmHg.            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  Continue amiodarone with the trepidation as outlined previously plan to decrease amiodarone dose to 200 mg twice daily in the next 48 hours or so.    Agree with DR CE, midodrine is not a good long term solution .pyridostigmine may be helpful, 30 bid  OOB as much as possible   Needs ACTH stimulation but needs to be off steroids todo this -- to understand hypotension nowtwithstanding long term      Signed, Sherryl Manges, MD  03/14/2023, 9:52 AM

## 2023-03-14 NOTE — Progress Notes (Signed)
 Daily Progress Note   Patient Name: Wendy Sanchez       Date: 03/14/2023 DOB: 1950-04-23  Age: 73 y.o. MRN#: [id number] Attending Physician: Lurene Shadow, MD Primary Care Physician: Pcp, No Admit Date: 03/07/2023  Reason for Consultation/Follow-up: Establishing goals of care  HPI/Brief Hospital Review: 73 y.o. 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    Noted recent admit 1/17-1/23 for sepsis without obvious sign of infection and hypotension felt to be chronic and related to adrenal insufficiency   Recent hospitalization at [facility] 1/10-1/15 for E. Coli UTI and PNA--discharged at that time to [facility] for STR   Palliative medicine was consulted for assisting with goals of care conversations  Subjective: Extensive chart review has been completed prior to meeting patient including labs, vital signs, imaging, progress notes, orders, and available advanced directive documents from current and previous encounters.    Visited with Ms. Hinze at her bedside. She is awake, alert and able to engage in conversation. Sister-Wilhemina on speaker phone during visit.  Provided medical updates and reviewed current plan of care.  Sister shares her concern related to recurrent hospitalizations and profound weakness. Sister shares her concern for Ms. Saline on being able to return home and feels STR is the best option. Shared [facility] team will be assisting with discharge  disposition.  Both are hopeful that Ms. Neukam will continue to improve and be able to return to prior level of functioning.  Answered and addressed all questions and concerns. Encouraged Ms. Knapik and McKenzie to reach out to PMT if needs or concerns arise. PMT will step away from daily visits and continue to follow peripherally. Please reengage if goals/plans change.  Thank you for allowing the Palliative Medicine Team to assist in the care of this patient.  Total time:  35 minutes  Time spent includes: Detailed review of medical records (labs, imaging, vital signs), medically appropriate exam (mental status, respiratory, cardiac, skin), discussed with treatment team, counseling and educating patient, family and staff, documenting clinical information, medication management and coordination of care.  Leeanne Deed, DNP, AGNP-C Palliative Medicine   Please contact Palliative Medicine Team phone at [phone] for questions and concerns.

## 2023-03-14 NOTE — Progress Notes (Addendum)
 Progress Note    Wendy Sanchez  ZOX:[id number] DOB: 08-23-1950  DOA: 03/07/2023 PCP: Pcp, No      Brief Narrative:    Medical records reviewed and are as summarized below:  Wendy Sanchez is a 73 y.o. female  with medical history significant of COPD, CHF with EF of 40-45%, depression, obesity, anemia, DVT and PE on Eliquis, temporal arteritis, renal cell carcinoma, adrenal insufficiency, s/p for colostomy secondary to perforated diverticulitis, who presented to the hospital with fever and increased urinary frequency.  Patient was found to have hypotension with blood pressure 86/59, which improved to 101/70 after giving 2 L LR bolus in ED, but blood pressure  dropped again to 82/61 with MAP of 69.  WBC 10.3, GFR> 60, negative PCR for COVID, flu and RSV, UA (hazy appearance, small amount of leukocyte, rare bacteria, WBC 11-20), lactic acid of 1.7 --> 2.2 --> 1.5, troponin 217 --> 251. Chest x-ray negative for infiltration.    2/24: Blood pressure with some improvement to 97/63, patient was started on midodrine and stress doses of steroid.  Elevated leukocytosis today likely secondary to stress doses of steroid.  Preliminary blood cultures negative.  Urine cultures pending.  Troponin peaked at 251 and now trending down.   Family is very concerned about this recurrent UTI, patient has multiple hospitalization for this and has been seen outpatient urology, there were recommending continuing antibiotics at suppressive doses.   2/25: Vital stable.  Blood pressure seems within goal now.  Mild hypokalemia which are being repleted.  Blood cultures remain negative, urine cultures with no growth.  Stopping antibiotics.  Sepsis ruled out.  Increasing the dose of midodrine. Had an episode of sinus tachycardia, restarting home metoprolol and torsemide.  PT is recommending SNF, not sure if she has any days left   2/26: Vital stable with blood pressure in low 100s.  Echo with EF of 35 to 40%, left  ventricular global hypokinesis and severely dilated cavity, grade 1 diastolic dysfunction.  Small pericardial effusion. Patient currently stable to go back to her facility, she needs another insurance authorization which was started today.  She will go back once insurance authorization got approval.  Palliative care team was consulted because of multiple comorbidities and high risk for readmissions and mortality     Assessment/Plan:   Principal Problem:   Hypotension Active Problems:   Secondary adrenal insufficiency (HCC)   Myocardial injury   Chronic obstructive pulmonary disease (COPD) (HCC)   Chronic pain syndrome   HFrEF (heart failure with reduced ejection fraction) (HCC)   History of pulmonary embolus 2020(PE)   Depression with anxiety   Obesity (BMI 30-39.9)   Pressure injury of skin   Sepsis (HCC)   Nonischemic cardiomyopathy (HCC)   Acute on chronic HFrEF (heart failure with reduced ejection fraction) (HCC)   SVT (supraventricular tachycardia) (HCC)    Body mass index is 40.63 kg/m.  (Class II obesity)    Hypotension, secondary adrenal insufficiency: BP stable.  Continue midodrine and hydrocortisone.  Start pyridostigmine 30 mg twice daily. S/p treatment with stress doses of IV steroids. Sepsis has been ruled out.  No evidence of UTI or pneumonia.   Acute on chronic HFrEF, valvular heart disease (mild to moderate TR, mild MR, mild aortic stenosis): Continue IV Lasix.  Monitor BMP, daily weight and urine output. Follow-up with cardiologist. BNP was 193.3 2D echo showed EF estimated at 35 to 40%, mild LVH, grade 1 diastolic dysfunction mildly enlarged RV  size, mildly dilated left atrium size, small pericardial effusion, mild MR, mild to moderate TR, mild aortic stenosis Previous 2D echo in September 2024 showed EF estimated at 40 to 45% History of low EF of 20% in 2019 thought to be due to frequent PVCs but she did not have significant improvement in EF following  PVC ablation at that time.   Elevated troponins: Troponin was 251 on admission and dropped to 61.   She has been seen by the cardiologist.  No plan for ischemic workup at this time. She has a history of elevated troponins   Paroxysmal SVT: Continue Toprol-XL and amiodarone.   History of pulmonary embolism in 2020: Continue Eliquis   Stage II decubitus ulcers on bilateral ischial tuberosity (present on admission): Continue local wound care   Comorbidities include COPD, chronic pain syndrome, depression, anxiety,      Diet Order             DIET DYS 3 Room service appropriate? Yes; Fluid consistency: Thin  Diet effective now                            Consultants: Cardiologist  Procedures: None    Medications:    amiodarone  400 mg Oral BID   apixaban  5 mg Oral BID   ascorbic acid  500 mg Oral Daily   aspirin EC  81 mg Oral Daily   calcium carbonate  2 tablet Oral Q breakfast   cholecalciferol  1,000 Units Oral Daily   cyanocobalamin  1,000 mcg Oral Daily   famotidine  20 mg Oral BID WC   ferrous sulfate  325 mg Oral BID WC   fluticasone  1 spray Each Nare Daily   folic acid  1 mg Oral Daily   furosemide  20 mg Intravenous BID   gabapentin  300 mg Oral TID   hydrocortisone  15 mg Oral Q breakfast   hydrocortisone  5 mg Oral BID AC   lactose free nutrition  237 mL Oral TID BM   loratadine  10 mg Oral QPM   metoprolol succinate  12.5 mg Oral Daily   midodrine  15 mg Oral TID WC   montelukast  10 mg Oral Daily   nortriptyline  50 mg Oral QHS   pantoprazole  40 mg Oral Q breakfast   polyvinyl alcohol  1 drop Both Eyes TID   pyridostigmine  30 mg Oral BID   spironolactone  12.5 mg Oral Daily   thiamine  100 mg Oral Daily   vitamin A  10,000 Units Oral Daily   Continuous Infusions:   Anti-infectives (From admission, onward)    Start     Dose/Rate Route Frequency Ordered Stop   03/08/23 2100  vancomycin (VANCOREADY) IVPB 1500 mg/300 mL   Status:  Discontinued        1,500 mg 150 mL/hr over 120 Minutes Intravenous Every 24 hours 03/08/23 0111 03/08/23 0711   03/08/23 2100  vancomycin (VANCOREADY) IVPB 1750 mg/350 mL  Status:  Discontinued        1,750 mg 175 mL/hr over 120 Minutes Intravenous Every 24 hours 03/08/23 0711 03/09/23 1122   03/08/23 0400  meropenem (MERREM) 1 g in sodium chloride 0.9 % 100 mL IVPB  Status:  Discontinued        1 g 200 mL/hr over 30 Minutes Intravenous Every 8 hours 03/08/23 0013 03/09/23 1122   03/08/23 0015  vancomycin (VANCOREADY) IVPB  1500 mg/300 mL        1,500 mg 150 mL/hr over 120 Minutes Intravenous  Once 03/08/23 0004 03/08/23 0238   03/07/23 2045  ceFEPIme (MAXIPIME) 2 g in sodium chloride 0.9 % 100 mL IVPB        2 g 200 mL/hr over 30 Minutes Intravenous  Once 03/07/23 2032 03/07/23 2105   03/07/23 2045  vancomycin (VANCOCIN) IVPB 1000 mg/200 mL premix        1,000 mg 200 mL/hr over 60 Minutes Intravenous  Once 03/07/23 2032 03/07/23 2207              Family Communication/Anticipated D/C date and plan/Code Status   DVT prophylaxis:  apixaban (ELIQUIS) tablet 5 mg     Code Status: Full Code  Family Communication: None Disposition Plan: Plan to discharge to SNF   Status is: Inpatient Remains inpatient appropriate because: CHF exacerbation       Subjective:   Interval events noted.  Breathing is better.  She still has leg swelling.  Objective:    Vitals:   03/13/23 2156 03/14/23 0057 03/14/23 0500 03/14/23 0924  BP: 119/73 98/63 105/73 96/65  Pulse: 84 90 84 92  Resp: 15 15  18   Temp: 98.3 F (36.8 C) 98.5 F (36.9 C) 98 F (36.7 C) (!) 97.5 F (36.4 C)  TempSrc:   Oral Oral  SpO2: 100% 100% 100% 94%  Weight:   121.2 kg   Height:       No data found.   Intake/Output Summary (Last 24 hours) at 03/14/2023 1503 Last data filed at 03/14/2023 0500 Gross per 24 hour  Intake 240 ml  Output 1100 ml  Net -860 ml   Filed Weights   03/12/23 0500  03/13/23 0500 03/14/23 0500  Weight: 120.3 kg 119.4 kg 121.2 kg    Exam:  GEN: NAD SKIN: Warm and dry EYES: No pallor or icterus ENT: MMM CV: RRR PULM: CTA B ABD: soft, obese, NT, +BS CNS: AAO x 3, non focal EXT: Significant bilateral lower extremity edema.  No erythema or tenderness    Data Reviewed:   I have personally reviewed following labs and imaging studies:  Labs: Labs show the following:   Basic Metabolic Panel: Recent Labs  Lab 03/07/23 2036 03/08/23 0341 03/09/23 0434 03/10/23 0410 03/13/23 0511  NA 136 135 137 136 136  K 3.8 3.9 3.3* 3.6 3.5  CL 98 98 102 103 99  CO2 27 24 25 26 30   GLUCOSE 108* 132* 115* 102* 83  BUN 23 20 27* 26* 19  CREATININE 0.84 0.73 0.79 0.68 0.60  CALCIUM 7.2* 7.4* 7.4* 7.3* 8.1*  MG  --   --  2.0  --  1.9   GFR Estimated Creatinine Clearance: 88.4 mL/min (by C-G formula based on SCr of 0.6 mg/dL). Liver Function Tests: Recent Labs  Lab 03/07/23 2036  AST 23  ALT 15  ALKPHOS 168*  BILITOT 0.7  PROT 5.1*  ALBUMIN <1.5*   No results for input(s): "LIPASE", "AMYLASE" in the last 168 hours. No results for input(s): "AMMONIA" in the last 168 hours. Coagulation profile Recent Labs  Lab 03/07/23 2036  INR 1.9*    CBC: Recent Labs  Lab 03/07/23 2036 03/08/23 0515 03/09/23 0434 03/10/23 0410  WBC 10.3 21.3* 9.6 7.7  NEUTROABS 8.2*  --   --   --   HGB 8.6* 8.3* 8.4* 8.1*  HCT 26.5* 25.7* 25.0* 24.2*  MCV 87.5 86.0 83.3 83.4  PLT  585* 603* 595* 574*   Cardiac Enzymes: No results for input(s): "CKTOTAL", "CKMB", "CKMBINDEX", "TROPONINI" in the last 168 hours. BNP (last 3 results) No results for input(s): "PROBNP" in the last 8760 hours. CBG: No results for input(s): "GLUCAP" in the last 168 hours. D-Dimer: No results for input(s): "DDIMER" in the last 72 hours. Hgb A1c: No results for input(s): "HGBA1C" in the last 72 hours. Lipid Profile: No results for input(s): "CHOL", "HDL", "LDLCALC", "TRIG",  "CHOLHDL", "LDLDIRECT" in the last 72 hours. Thyroid function studies: No results for input(s): "TSH", "T4TOTAL", "T3FREE", "THYROIDAB" in the last 72 hours.  Invalid input(s): "FREET3" Anemia work up: No results for input(s): "VITAMINB12", "FOLATE", "FERRITIN", "TIBC", "IRON", "RETICCTPCT" in the last 72 hours. Sepsis Labs: Recent Labs  Lab 03/07/23 2035 03/07/23 2036 03/07/23 2251 03/07/23 2353 03/08/23 0515 03/09/23 0434 03/10/23 0410  WBC  --  10.3  --   --  21.3* 9.6 7.7  LATICACIDVEN 1.7  --  2.2* 1.5  --   --   --     Microbiology Recent Results (from the past 240 hours)  Resp panel by RT-PCR (RSV, Flu A&B, Covid) Anterior Nasal Swab     Status: None   Collection Time: 03/07/23  8:35 PM   Specimen: Anterior Nasal Swab  Result Value Ref Range Status   SARS Coronavirus 2 by RT PCR NEGATIVE NEGATIVE Final    Comment: (NOTE) SARS-CoV-2 target nucleic acids are NOT DETECTED.  The SARS-CoV-2 RNA is generally detectable in upper respiratory specimens during the acute phase of infection. The lowest concentration of SARS-CoV-2 viral copies this assay can detect is 138 copies/mL. A negative result does not preclude SARS-Cov-2 infection and should not be used as the sole basis for treatment or other patient management decisions. A negative result may occur with  improper specimen collection/handling, submission of specimen other than nasopharyngeal swab, presence of viral mutation(s) within the areas targeted by this assay, and inadequate number of viral copies(<138 copies/mL). A negative result must be combined with clinical observations, patient history, and epidemiological information. The expected result is Negative.  Fact Sheet for Patients:  BloggerCourse.com  Fact Sheet for Healthcare Providers:  SeriousBroker.it  This test is no t yet approved or cleared by the [place] FDA and  has been authorized for  detection and/or diagnosis of SARS-CoV-2 by FDA under an Emergency Use Authorization (EUA). This EUA will remain  in effect (meaning this test can be used) for the duration of the COVID-19 declaration under Section 564(b)(1) of the Act, 21 U.S.C.section 360bbb-3(b)(1), unless the authorization is terminated  or revoked sooner.       Influenza A by PCR NEGATIVE NEGATIVE Final   Influenza B by PCR NEGATIVE NEGATIVE Final    Comment: (NOTE) The Xpert Xpress SARS-CoV-2/FLU/RSV plus assay is intended as an aid in the diagnosis of influenza from Nasopharyngeal swab specimens and should not be used as a sole basis for treatment. Nasal washings and aspirates are unacceptable for Xpert Xpress SARS-CoV-2/FLU/RSV testing.  Fact Sheet for Patients: BloggerCourse.com  Fact Sheet for Healthcare Providers: SeriousBroker.it  This test is not yet approved or cleared by the [place] FDA and has been authorized for detection and/or diagnosis of SARS-CoV-2 by FDA under an Emergency Use Authorization (EUA). This EUA will remain in effect (meaning this test can be used) for the duration of the COVID-19 declaration under Section 564(b)(1) of the Act, 21 U.S.C. section 360bbb-3(b)(1), unless the authorization is terminated or revoked.  Resp Syncytial Virus by PCR NEGATIVE NEGATIVE Final    Comment: (NOTE) Fact Sheet for Patients: BloggerCourse.com  Fact Sheet for Healthcare Providers: SeriousBroker.it  This test is not yet approved or cleared by the [place] FDA and has been authorized for detection and/or diagnosis of SARS-CoV-2 by FDA under an Emergency Use Authorization (EUA). This EUA will remain in effect (meaning this test can be used) for the duration of the COVID-19 declaration under Section 564(b)(1) of the Act, 21 U.S.C. section 360bbb-3(b)(1), unless the authorization is  terminated or revoked.  Performed at [facility], [address] [place], [place] [address]   Blood Culture (routine x 2)     Status: None   Collection Time: 03/07/23  8:35 PM   Specimen: BLOOD  Result Value Ref Range Status   Specimen Description BLOOD BLOOD LEFT HAND  Final   Special Requests   Final    BOTTLES DRAWN AEROBIC AND ANAEROBIC Blood Culture results may not be optimal due to an excessive volume of blood received in culture bottles   Culture   Final    NO GROWTH 5 DAYS Performed at [facility], [address] [place], [place] [address]    Report Status 03/12/2023 FINAL  Final  Blood Culture (routine x 2)     Status: None   Collection Time: 03/07/23  8:36 PM   Specimen: BLOOD  Result Value Ref Range Status   Specimen Description BLOOD BLOOD LEFT ARM  Final   Special Requests   Final    BOTTLES DRAWN AEROBIC AND ANAEROBIC Blood Culture results may not be optimal due to an excessive volume of blood received in culture bottles   Culture   Final    NO GROWTH 5 DAYS Performed at [facility], [address] [place], [place] [address]    Report Status 03/12/2023 FINAL  Final  Urine Culture     Status: None   Collection Time: 03/07/23  9:26 PM   Specimen: Urine, Random  Result Value Ref Range Status   Specimen Description   Final    URINE, RANDOM Performed at [facility], [address], [place], [place] [address]    Special Requests   Final    NONE Reflexed from [phone] Performed at [facility], [address], [place], [place] [address]    Culture   Final    NO GROWTH Performed at [facility], [address], [place], [place] [address]    Report Status 03/09/2023 FINAL  Final    Procedures and diagnostic studies:  No results found.              LOS: 7 days   Jewelz Kobus  [facility]   Pager on [email or web address]. If 7PM-7AM, please contact night-coverage at  [email or web address]     03/14/2023, 3:03 PM

## 2023-03-15 DIAGNOSIS — I5023 Acute on chronic systolic (congestive) heart failure: Secondary | ICD-10-CM | POA: Diagnosis not present

## 2023-03-15 DIAGNOSIS — I959 Hypotension, unspecified: Secondary | ICD-10-CM | POA: Diagnosis not present

## 2023-03-15 LAB — BASIC METABOLIC PANEL
Anion gap: 6 (ref 5–15)
BUN: 19 mg/dL (ref 8–23)
CO2: 30 mmol/L (ref 22–32)
Calcium: 8.1 mg/dL — ABNORMAL LOW (ref 8.9–10.3)
Chloride: 99 mmol/L (ref 98–111)
Creatinine, Ser: 0.58 mg/dL (ref 0.44–1.00)
GFR, Estimated: >60 mL/min (ref >60)
Glucose, Bld: 87 mg/dL (ref 70–99)
Potassium: 3.9 mmol/L (ref 3.5–5.1)
Sodium: 135 mmol/L (ref 135–145)

## 2023-03-15 LAB — CBC
HCT: 26.1 % — ABNORMAL LOW (ref 36.0–46.0)
Hemoglobin: 8.6 g/dL — ABNORMAL LOW (ref 12.0–15.0)
MCH: 28.4 pg (ref 26.0–34.0)
MCHC: 33 g/dL (ref 30.0–36.0)
MCV: 86.1 fL (ref 80.0–100.0)
Platelets: 615 10*3/uL — ABNORMAL HIGH (ref 150–400)
RBC: 3.03 MIL/uL — ABNORMAL LOW (ref 3.87–5.11)
RDW: 16.2 % — ABNORMAL HIGH (ref 11.5–15.5)
WBC: 8 10*3/uL (ref 4.0–10.5)
nRBC: 0.2 % (ref 0.0–0.2)

## 2023-03-15 LAB — MAGNESIUM: Magnesium: 1.9 mg/dL (ref 1.7–2.4)

## 2023-03-15 MED ORDER — ASPIRIN 81 MG PO CHEW
81.0000 mg | CHEWABLE_TABLET | ORAL | Status: AC
  Administered 2023-03-16: 81 mg via ORAL
  Filled 2023-03-15: qty 1

## 2023-03-15 MED ORDER — SODIUM CHLORIDE 0.9 % IV SOLN: INTRAVENOUS | Status: DC

## 2023-03-15 MED ORDER — FUROSEMIDE 10 MG/ML IJ SOLN
80.0000 mg | Freq: Two times a day (BID) | INTRAMUSCULAR | Status: DC
  Administered 2023-03-15 – 2023-03-16 (×2): 80 mg via INTRAVENOUS
  Filled 2023-03-15 (×2): qty 8

## 2023-03-15 NOTE — Consult Note (Signed)
 Advanced Heart Failure Team Consult Note   Primary Physician: Pcp, No Cardiologist:  Julien Nordmann, MD  Reason for Consultation: CHF  HPI:    Wendy Sanchez is seen today for evaluation of CHF at the request of Dr. Kirke Corin.   73 y.o. 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 Patient has been followed at [facility] for cardiology.  She had EF down to 25-30% back in 2018 which recovered to EF 60-65% on echo in 11/22 and 55% by echo in 3/23.  Most recently, EF was low again in 9/24 by echo with EF 40-45% and echo this admission showed EF 35-40%, severe LV dilation, mild LVH, normal RV, mild MR, mild aortic stenosis.  GDMT has been limited by low BP/orthostasis.  She was only on apixaban and low dose torsemide at home prior to admission.  She was admitted on 2/23 from her rehab facility with fever to 100.1 and hypotension.  HS-TnI was mildly elevated, peaked at 241 and suspected to be demand ischemia.  She was started on antibiotics for possible urosepsis. However, cultures remained negative and antibiotics were stopped.  With ongoing low BP, midodrine was started and increased to 15 mg tid.  She was later started on pyridostigmine.  Due to SVT runs, amiodarone was started.  She is currently on 400 mg bid. She is on hydrocortisone for ?adrenal insufficiency.  She has been on Lasix 20 mg IV bid with minimal diuresis (weight is up).  Her legs are very edematous.    She sat on the side of the bed today, has mild lightheadedness with this. SBP in 90s.  No chest pain.  Dyspnea if she "moves around."   Home Medications Prior to Admission medications   Medication Sig Start Date End Date Taking? Authorizing Provider  acetaminophen (TYLENOL) 325 MG tablet Take 2 tablets (650 mg total) by mouth every 6 (six) hours as needed for mild pain or fever. 11/17/20  Yes Myrtie Neither, MD   albuterol (VENTOLIN HFA) 108 (90 Base) MCG/ACT inhaler Inhale 2 puffs into the lungs every 6 (six) hours as needed for wheezing.   Yes [provider]  alendronate (FOSAMAX) 35 MG tablet Take 1 tablet (35 mg total) by mouth every 7 (seven) days Take with a full glass of water. Do not lie down for the next 30 min. 07/09/22 07/09/23 Yes [provider]  apixaban (ELIQUIS) 5 MG TABS tablet Take 1 tablet (5 mg total) by mouth 2 (two) times daily. 07/22/18 03/07/23 Yes Pyreddy, Vivien Rota, MD  ascorbic acid (VITAMIN C) 500 MG tablet Take 500 mg by mouth daily.   Yes [provider]  calcium carbonate (OS-CAL - DOSED IN MG OF ELEMENTAL CALCIUM) 1250 (500 Ca) MG tablet Take 2 tablets by mouth in the morning.   Yes [provider]  Carboxymethylcellulose Sod PF 1 % GEL Administer 2 drops to both eyes Three (3) times a day. 10/20/22  Yes [provider]  Cholecalciferol (VITAMIN D-1000 MAX ST) 25 MCG (1000 UT) tablet Take 1,000 Units by mouth daily. 10/20/22 10/20/23 Yes [provider]  cyanocobalamin (VITAMIN B12) 1000 MCG tablet Take 1 tablet by mouth daily. 10/20/22  Yes [provider]  diphenhydrAMINE (BENADRYL) 25 mg capsule Take 25 mg by mouth every 6 (six) hours as needed for itching or allergies.   Yes [provider]  docusate sodium (COLACE)  100 MG capsule Take 100 mg by mouth 2 (two) times daily as needed for mild constipation.   Yes [provider]  estradiol (ESTRACE) 0.1 MG/GM vaginal cream Place 2 g vaginally 3 (three) times a week. Monday, Wednesday and Friday for recurrent UTI 06/26/22 06/26/23 Yes [provider]  famotidine (PEPCID) 20 MG tablet Take 20 mg by mouth 2 (two) times daily.   Yes [provider]  ferrous sulfate 325 (65 FE) MG tablet Take 325 mg by mouth 2 (two) times daily with a meal.   Yes [provider]  fluticasone (FLONASE) 50 MCG/ACT nasal spray Place 1 spray into both nostrils  daily.   Yes [provider]  folic acid (FOLVITE) 1 MG tablet Take 1 mg by mouth daily. 01/18/23  Yes [provider]  gabapentin (NEURONTIN) 300 MG capsule Take 1 capsule (300 mg total) by mouth 3 (three) times daily. 03/03/23 07/01/23 Yes Yevette Edwards, MD  hydrocortisone (CORTEF) 5 MG tablet Take 3 tablets (15 mg total) by mouth every morning AND 1 tablet (5 mg total) every afternoon after lunch and before dinner 10/20/22  Yes [provider]  ipratropium-albuterol (DUONEB) 0.5-2.5 (3) MG/3ML SOLN Take 3 mLs by nebulization every 6 (six) hours as needed (shortness of breath).   Yes [provider]  levocetirizine (XYZAL) 5 MG tablet Take 10 mg by mouth every evening.   Yes [provider]  melatonin 3 MG TABS tablet Take 3 mg by mouth at bedtime as needed. 10/20/22  Yes [provider]  metoprolol tartrate (LOPRESSOR) 25 MG tablet Take 1 tablet (25 mg total) by mouth 2 (two) times daily. 02/04/23  Yes Arnetha Courser, MD  midodrine (PROAMATINE) 10 MG tablet Take 5 mg by mouth 3 (three) times daily.   Yes [provider]  montelukast (SINGULAIR) 10 MG tablet Take 10 mg by mouth daily. 01/27/23  Yes [provider]  mupirocin ointment (BACTROBAN) 2 % Apply 1 Application topically 3 (three) times daily as needed. 01/18/23  Yes [provider]  nortriptyline (PAMELOR) 50 MG capsule Take 50 mg by mouth at bedtime.   Yes [provider]  omeprazole (PRILOSEC OTC) 20 MG tablet Take 20 mg by mouth daily.   Yes [provider]  ondansetron (ZOFRAN) 4 MG tablet Take 1 tablet (4 mg total) by mouth every 6 (six) hours as needed for nausea. 02/05/21  Yes Sheikh, Omair Latif, DO  oxyCODONE-acetaminophen (PERCOCET) 10-325 MG tablet Take 1 tablet by mouth every 6 (six) hours as needed for pain. 03/03/23 04/02/23 Yes Yevette Edwards, MD  polyethylene glycol (MIRALAX / GLYCOLAX) 17 g packet Take 17 g by mouth daily as needed for  moderate constipation. 11/17/20  Yes Myrtie Neither, MD  potassium chloride SA (KLOR-CON M) 20 MEQ tablet Take 20 mEq by mouth 2 (two) times daily.   Yes [provider]  thiamine (VITAMIN B1) 100 MG tablet Take 1 tablet by mouth daily. 10/20/22  Yes [provider]  torsemide (DEMADEX) 20 MG tablet Take 20 mg by mouth daily. 03/06/23 03/12/23 Yes [provider]  triamcinolone cream (KENALOG) 0.1 % Apply topically daily as needed.   Yes [provider]  vitamin A 3 MG (10000 UNITS) capsule Take 1 capsule (10,000 Units total) by mouth daily. For 60 days 02/05/23  Yes Arnetha Courser, MD  copper tablet Take 1 tablet (2 mg total) by mouth daily. Patient not taking: Reported on 03/08/2023 02/05/23   Arnetha Courser, MD  lactose free nutrition (BOOST PLUS) LIQD Take 237 mLs by mouth 3 (three) times daily between meals. 02/04/23   Arnetha Courser, MD  lidocaine 4 %     [provider]  magic mouthwash w/lidocaine SOLN Take 15 mLs by mouth 3 (three) times daily as needed for mouth pain. Suspension contains equal amounts of Maalox Extra Strength, nystatin, diphenhydramine and lidocaine. Patient not taking: Reported on 03/08/2023 02/04/23   Arnetha Courser, MD  nystatin cream (MYCOSTATIN) Apply 1 application  topically 4 (four) times daily. (Apply under skin folds) Patient not taking: Reported on 03/08/2023    [provider]  oxyCODONE-acetaminophen (PERCOCET) 10-325 MG tablet Take 1 tablet by mouth every 6 (six) hours as needed for pain. Patient not taking: Reported on 03/08/2023 04/02/23 05/02/23  Yevette Edwards, MD  torsemide (DEMADEX) 20 MG tablet Take 1 tablet (20 mg total) by mouth daily. 02/05/23   Arnetha Courser, MD  zinc sulfate, 50mg  elemental zinc, 220 (50 Zn) MG capsule Take 1 capsule (220 mg total) by mouth daily. For 14 days Patient not taking: Reported on 03/07/2023 02/05/23   Arnetha Courser, MD    Past Medical History: Past Medical History:  Diagnosis Date    Allergic rhinitis    Arthritis    Asthma    problems with fumes and aerosols cause asthma   Chest pain 10/19/2016   CHF (congestive heart failure) (HCC)    Complication of anesthesia    awakens during surgery; has occurred with last 3-4 surgeries    COPD (chronic obstructive pulmonary disease) (HCC)    DVT (deep venous thrombosis) (HCC) 07/13/2018   Right leg   Environmental allergies    fumes    Fibromyalgia    GERD (gastroesophageal reflux disease)    Headache    History of bronchitis    Hx of total knee arthroplasty 12/13/2015   Hyperlipidemia    Hypertension    Morbid obesity with BMI of 45.0-49.9, adult (HCC) 07/25/2015   NICM (nonischemic cardiomyopathy) (HCC)    a. ? PVC mediated;  b. 06/2016 Echo: EF 25-30%, mild LVH, mild LAE, mild AI/MR/TR/PR; c. 08/2016 Cath: nl cors, EF 25%.   Numbness    hands bilat when driving; improves when not preforming task    Osteoarthritis of left knee 08/02/2015   PE (pulmonary thromboembolism) (HCC) 07/13/2018   Pes anserinus bursitis of left knee 05/18/2016   Presence of right artificial knee joint 05/18/2016   PVC (premature ventricular contraction) 06/04/2017   PVC's (premature ventricular contractions)    a. 11/2016 Amio started;  b. 11/29/16 48h Holter: 57574 PVC's (41%); c. 12/2016 24h Holter: 18040 PVC's (15%); c. 02/2017 48h Holter: 37262 PVC's (41%).   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   Temporal arteritis (HCC) 05/01/2020   Tinnitus    comes and goes    Unilateral primary osteoarthritis, right knee 12/13/2015   Vertigo    none for over 2 yrs    Past Surgical History: Past Surgical History:  Procedure Laterality Date   ABDOMINAL HYSTERECTOMY  1979   left ovary remains   ABDOMINAL HYSTERECTOMY     ARTERY BIOPSY Left 06/07/2019   Procedure: BIOPSY TEMPORAL ARTERY;  Surgeon: Annice Needy,  MD;  Location: [facility] ORS;  Service: Vascular;  Laterality: Left;   CHOLECYSTECTOMY     COLON SURGERY  COLONOSCOPY     COLONOSCOPY N/A 10/30/2019   Procedure: COLONOSCOPY;  Surgeon: Regis Bill, MD;  Location: [facility] ENDOSCOPY;  Service: Endoscopy;  Laterality: N/A;   ESOPHAGOGASTRODUODENOSCOPY N/A 10/30/2019   Procedure: ESOPHAGOGASTRODUODENOSCOPY (EGD);  Surgeon: Regis Bill, MD;  Location: [facility] ENDOSCOPY;  Service: Endoscopy;  Laterality: N/A;   fibrous tissue removed from right shoulder and back of neck      2 years ago    FLOOR OF MOUTH BIOPSY N/A 02/28/2019   Procedure: SALIVARY GLAND BIOPSY;  Surgeon: Bud Face, MD;  Location: [facility] ORS;  Service: ENT;  Laterality: N/A;   FRACTURE SURGERY     left small finger   GANGLION CYST EXCISION     GASTRIC BYPASS  1980   states had bypass with banding and band left in -   GASTRIC BYPASS OPEN     JOINT REPLACEMENT Bilateral 2017   knees   KNEE SURGERY Bilateral    both knees  2001   LAPAROSCOPIC SIGMOID COLECTOMY Left 09/13/2020   at [facility] for a perforated sigmoid diverticulitis   NECK SURGERY N/A 1205/19   ganglion cyst removal, benigh    PARTIAL COLECTOMY  09/2020   PULMONARY THROMBECTOMY Bilateral 07/14/2018   Procedure: PULMONARY THROMBECTOMY;  Surgeon: Annice Needy, MD;  Location: [facility] INVASIVE CV LAB;  Service: Cardiovascular;  Laterality: Bilateral;   PVC ABLATION N/A 06/04/2017   Procedure: PVC ABLATION;  Surgeon: Marinus Maw, MD;  Location: [facility] INVASIVE CV LAB;  Service: Cardiovascular;  Laterality: N/A;   RIGHT/LEFT HEART CATH AND CORONARY ANGIOGRAPHY Bilateral 08/12/2016   Procedure: Right/Left Heart Cath and Coronary Angiography;  Surgeon: Alwyn Pea, MD;  Location: [facility] INVASIVE CV LAB;  Service: Cardiovascular;  Laterality: Bilateral;   TONSILLECTOMY     age 69   TOTAL KNEE ARTHROPLASTY Left 08/02/2015   Procedure: LEFT TOTAL KNEE ARTHROPLASTY;  Surgeon: Kathryne Hitch, MD;   Location: [facility] ORS;  Service: Orthopedics;  Laterality: Left;   TOTAL KNEE ARTHROPLASTY Right 12/13/2015   Procedure: RIGHT TOTAL KNEE ARTHROPLASTY;  Surgeon: Kathryne Hitch, MD;  Location: [facility] ORS;  Service: Orthopedics;  Laterality: Right;    Family History: Family History  Problem Relation Age of Onset   Hypertension Father    Deep vein thrombosis Father    Dementia Mother    Diabetes Sister    Congestive Heart Failure Sister    Congestive Heart Failure Brother    Congestive Heart Failure Daughter    Congestive Heart Failure Son    Breast cancer Maternal Aunt        great aunt    Social History: Social History   Socioeconomic History   Marital status: Single    Spouse name: Not on file   Number of children: 3   Years of education: Not on file   Highest education level: Some college, no degree  Occupational History   Not on file  Tobacco Use   Smoking status: Former    Current packs/day: 0.00    Average packs/day: 0.3 packs/day for 50.0 years (12.5 ttl pk-yrs)    Types: Cigarettes    Start date: 08/07/1970    Quit date: 08/06/2020    Years since quitting: 2.6   Smokeless tobacco: Current    Types: Chew  Vaping Use   Vaping status: Never Used  Substance and Sexual Activity   Alcohol use: No    Alcohol/week: 0.0 standard drinks of alcohol   Drug use: No   Sexual activity: Not  Currently  Other Topics Concern   Not on file  Social History Narrative   Not on file   Social Drivers of Health   Financial Resource Strain: Low Risk  (12/26/2020)   Received from [facility]   Overall Financial Resource Strain (CARDIA)    Difficulty of Paying Living Expenses: Not very hard  Food Insecurity: No Food Insecurity (03/12/2023)   Hunger Vital Sign    Worried About Running Out of Food in the Last Year: Never true    Ran Out of Food in the Last Year: Never true  Transportation Needs: Unmet Transportation Needs (03/12/2023)   PRAPARE - Therapist, art (Medical): Yes    Lack of Transportation (Non-Medical): Yes  Physical Activity: Not on file  Stress: Not on file  Social Connections: Unknown (03/08/2023)   Social Connection and Isolation Panel [NHANES]    Frequency of Communication with Friends and Family: More than three times a week    Frequency of Social Gatherings with Friends and Family: More than three times a week    Attends Religious Services: More than 4 times per year    Active Member of [employer] or Organizations: No    Attends Engineer, structural: More than 4 times per year    Marital Status: Patient declined    Allergies:  Allergies  Allergen Reactions   Hydrocodone-Acetaminophen Swelling    hands   Iodine Anaphylaxis and Swelling    Iv dye 02/03/21-Ispoke to patient and she had IV contrast  X4 last year and has had no problem. ( Last CT abdomen with contrast was on 01/02/21.-    Other Other (See Comments)    ALLERGY TO METAL - BLACKENS SKIN AND CAUSES A RASH    Tape Swelling and Other (See Comments)    Skin comes off.  Paper tape is ok tegaderm OK   Peanut Oil Other (See Comments)    ** Nuts cause runny nose   Shellfish Allergy Nausea And Vomiting and Swelling    Episode of GI infection after eating clam chowder. Still eats shrimp and other seafood    Objective:    Vital Signs:   Temp:  [97.9 F (36.6 C)-99.1 F (37.3 C)] 97.9 F (36.6 C) (03/03 0815) Pulse Rate:  [87-93] 87 (03/03 0815) Resp:  [18] 18 (03/03 0815) BP: (94-109)/(56-69) 95/56 (03/03 0815) SpO2:  [98 %-100 %] 100 % (03/03 0815) Last BM Date : 03/15/23  Weight change: Filed Weights   03/12/23 0500 03/13/23 0500 03/14/23 0500  Weight: 120.3 kg 119.4 kg 121.2 kg    Intake/Output:   Intake/Output Summary (Last 24 hours) at 03/15/2023 1622 Last data filed at 03/15/2023 1100 Gross per 24 hour  Intake 600 ml  Output 751 ml  Net -151 ml      Physical Exam    General:  NAD HEENT: normal Neck: supple.  JVP 8-9 cm. Carotids 2+ bilat; no bruits. No lymphadenopathy or thyromegaly appreciated. Cor: PMI nondisplaced. Regular rate & rhythm. 2/6 SEM RUSB with clear S2 Lungs: Mild crackles at bases.  Abdomen: soft, nontender, nondistended. No hepatosplenomegaly. No bruits or masses. Good bowel sounds. Extremities: no cyanosis, clubbing, rash. 2+ edema to knees.  Neuro: alert & orientedx3, cranial nerves grossly intact. moves all 4 extremities w/o difficulty. Affect pleasant   Telemetry   NSR 90s (personally reviewed)  EKG    NSR, PVCs, poor R wave progression (personally reviewed)  Labs   Basic Metabolic  Panel: Recent Labs  Lab 03/09/23 0434 03/10/23 0410 03/13/23 0511 03/15/23 0520  NA 137 136 136 135  K 3.3* 3.6 3.5 3.9  CL 102 103 99 99  CO2 25 26 30 30   GLUCOSE 115* 102* 83 87  BUN 27* 26* 19 19  CREATININE 0.79 0.68 0.60 0.58  CALCIUM 7.4* 7.3* 8.1* 8.1*  MG 2.0  --  1.9 1.9    Liver Function Tests: No results for input(s): "AST", "ALT", "ALKPHOS", "BILITOT", "PROT", "ALBUMIN" in the last 168 hours. No results for input(s): "LIPASE", "AMYLASE" in the last 168 hours. No results for input(s): "AMMONIA" in the last 168 hours.  CBC: Recent Labs  Lab 03/09/23 0434 03/10/23 0410 03/15/23 0520  WBC 9.6 7.7 8.0  HGB 8.4* 8.1* 8.6*  HCT 25.0* 24.2* 26.1*  MCV 83.3 83.4 86.1  PLT 595* 574* 615*    Cardiac Enzymes: No results for input(s): "CKTOTAL", "CKMB", "CKMBINDEX", "TROPONINI" in the last 168 hours.  BNP: BNP (last 3 results) Recent Labs    02/04/23 0523 03/07/23 2035  BNP 315.9* 193.3*    ProBNP (last 3 results) No results for input(s): "PROBNP" in the last 8760 hours.   CBG: No results for input(s): "GLUCAP" in the last 168 hours.  Coagulation Studies: No results for input(s): "LABPROT", "INR" in the last 72 hours.   Imaging   No results found.   Medications:     Current Medications:  amiodarone  400 mg Oral BID   ascorbic acid  500  mg Oral Daily   calcium carbonate  2 tablet Oral Q breakfast   cholecalciferol  1,000 Units Oral Daily   cyanocobalamin  1,000 mcg Oral Daily   famotidine  20 mg Oral BID WC   ferrous sulfate  325 mg Oral BID WC   fluticasone  1 spray Each Nare Daily   folic acid  1 mg Oral Daily   furosemide  80 mg Intravenous BID   gabapentin  300 mg Oral TID   hydrocortisone  15 mg Oral Q breakfast   hydrocortisone  5 mg Oral BID AC   lactose free nutrition  237 mL Oral TID BM   loratadine  10 mg Oral QPM   metoprolol succinate  12.5 mg Oral Daily   midodrine  15 mg Oral TID WC   montelukast  10 mg Oral Daily   nortriptyline  50 mg Oral QHS   pantoprazole  40 mg Oral Q breakfast   polyvinyl alcohol  1 drop Both Eyes TID   pyridostigmine  30 mg Oral BID   spironolactone  12.5 mg Oral Daily   thiamine  100 mg Oral Daily   vitamin A  10,000 Units Oral Daily    Infusions:      Assessment/Plan   1. Acute on chronic systolic CHF: Echo this admission with EF 35-40%, severe LV dilation, mild LVH, normal RV, mild MR, mild aortic stenosis.  EF was 25-30% by echo in 2018 but improved back to normal range.  Now it has fallen again. GDMT limited by orthostatic hypotension.  She has prominent peripheral edema, not as prominent JVD.  Weight is up since arriving in the hospital and she has not diuresed much.  NYHA class IIIb but confounded by deconditioning.  - I think that she needs a RHC to guide therapy.  Will plan on doing this tomorrow.  With drop in EF, will also plan for coronary angiography.  I discussed risks/benefits with patient and her sister, she agrees  to procedure.  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  Ideally would get a PYP scan, but this may have to be done as an outpatient.  I will order a cardiac MRI and make sure that myeloma panel/urine immunofixation have been sent.  - Place Unna boots.  - Will diurese with Lasix 80 mg IV bid, hold  only for SBP < 85.  Need to follow I/Os and weights.  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  - She can continue Toprol XL 12.5 mg daily.  - Continue spironolactone 12.5 daily.  - Not a candidate for SGLT2 inhibitors due to frequent UTIs.  2. SVT: She is now on amiodarone with no further SVT runs.  - Decrease amiodarone to 200 mg bid.  3. PVCs: H/o PVC ablation in 2019.   - Now on amiodarone.  4. Giant cell arteritis: Prednisone in past, now on hydrocortisone.  5. H/o diverticulitis with sigmoid colectomy and colostomy.  6. 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  - Hold apixaban for 2 doses for cath.  - She can stop ASA given Eliquis [place].  7. Elevated troponin: Suspect demand ischemia with volume overload.  However, plan for coronary angiography tomorrow.   Length of Stay: 8  Marca Ancona, MD  03/15/2023, 4:22 PM  Advanced Heart Failure Team Pager [phone] (M-F; 7a - 5p)  Please contact [facility] for night-coverage after hours (4p -7a ) and weekends on [email or web address]

## 2023-03-15 NOTE — Progress Notes (Addendum)
 Went to pt room to discuss and get pt to sign consent form for 3/4 heart cath procedure , pt states that she would like to wait to sign consent when sister is present , sister usually makes care decisions for pt per pt. Pt did say that doctor had discussed the procedure with pt but would like to wait until tomorrow. Pt states that doctor told her procedure would be in the afternoon.

## 2023-03-15 NOTE — Progress Notes (Signed)
 Heart Failure Stewardship Pharmacy Note  PCP: Pcp, No PCP-Cardiologist: Pcp, No  HPI: Wendy Sanchez is a 73 y.o. female with COPD, CHF with EF of 40-45%, depression, obesity, anemia, DVT and PE on Eliquis, temporal arteritis, renal cell carcinoma, adrenal insufficiency, s/p for colostomy secondary to perforated diverticulitis who presented with fever and increased urinary frequency. Urina and blood cultures have been negative. On admission, BNP was 193.3, HS-troponin was 217 > 251 >241 >150, and lactic acid 1.7  > 2.2 > 1.5. Chest x-ray noted no active pulmonary disease.     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 Underwent PVC ablation in 05/2017. Echo in 08/2017 with LVEF 20-25% and grade I diastolic dysfunction. Echo in 11/2020 showed LVEF improved to 60-65%. Echo this admission noted LVEF back down to 35-40%, mild LVH, severely dilated LV, mildly dilated LA, small pericardial effusion, mild MR, mild AS, and grade I diastolic dysfunction.   Pertinent Lab Values: Creatinine, Ser  Date Value Ref Range Status  03/15/2023 0.58 0.44 - 1.00 mg/dL Final   BUN  Date Value Ref Range Status  03/15/2023 19 8 - 23 mg/dL Final  16/10/9602 11 8 - 27 mg/dL Final   Potassium  Date Value Ref Range Status  03/15/2023 3.9 3.5 - 5.1 mmol/L Final   Sodium  Date Value Ref Range Status  03/15/2023 135 135 - 145 mmol/L Final  04/11/2020 140 134 - 144 mmol/L Final   B Natriuretic Peptide  Date Value Ref Range Status  03/07/2023 193.3 (H) 0.0 - 100.0 pg/mL Final    Comment:    Performed at [facility], [address] [place], [place] [address]   Magnesium  Date Value Ref Range Status  03/15/2023 1.9 1.7 - 2.4 mg/dL Final    Comment:    Performed at [facility], [address] [place], [place] [address]   Hgb A1c MFr Bld  Date Value Ref Range Status   03/07/2023 4.9 4.8 - 5.6 % Final    Comment:    (NOTE) Pre diabetes:          5.7%-6.4%  Diabetes:              >6.4%  Glycemic control for   <7.0% adults with diabetes    TSH  Date Value Ref Range Status  11/25/2020 1.080 0.350 - 4.500 uIU/mL Final    Comment:    Performed by a 3rd Generation assay with a functional sensitivity of <=0.01 uIU/mL. Performed at [facility], [address] [place], [place] [address]   02/25/2017 1.820 0.450 - 4.500 uIU/mL Final    Vital Signs: Admission weight: 259 lbs Temp:  [97.5 F (36.4 C)-99.1 F (37.3 C)] 97.9 F (36.6 C) (03/03 0815) Pulse Rate:  [87-93] 87 (03/03 0815) Cardiac Rhythm: Normal sinus rhythm (03/03 0700) Resp:  [18] 18 (03/03 0815) BP: (94-109)/(56-69) 95/56 (03/03 0815) SpO2:  [94 %-100 %] 100 % (03/03 0815) No intake or output data in the 24 hours ending 03/15/23 0842   Current Heart Failure Medications:  Loop diuretic: furosemide 20 mg IV twice daily Beta-Blocker: metoprolol succinate 12.5 mg daily ACEI/ARB/ARNI: none MRA: spironolactone 12.5 mg daily SGLT2i: none Other: midodrine 15 mg TID  Prior to admission Heart Failure Medications:  Loop diuretic: torsemide 20 mg daily Beta-Blocker: metoprolol tartrate 25 mg BID ACEI/ARB/ARNI: none MRA: none SGLT2i: none Other: midodrine 5 mg TID  Assessment:  1. Acute on chronic combined systolic and diastolic heart failure (LVEF 35-40%) with grade I diastolic dysfunction, due to NICM likely secondary to PVCs, however does have low voltage QRS, carpal tunnel, and peripheral neuropathy. Cardiology considering PYP. NYHA class III symptoms.  -Symptoms: Reports shortness of breath is somewhat improved, despite weight gain. Reports LEE. Denies any dizziness or lightheadedness, though not mobile currently. Appetite is fair. -Volume: Appears hypervolemic with persistent pitting LEE. Creatinine and BUN are stable. Changed from oral torsemide to furosemide 20 mg IV  BID. Weight continues to increase, though these are bed weights with questionable accuracy. Recommend increasing furosemide dose given persistent volume overload. Increasing furosemide will likely not have huge effects on BP. -Hemodynamics: BP is stable, though currently on midodrine 15 mg TID. This increased afterload in patients with HFrEF can lead to worsening cardiac function. Can attempt to titrate down as tolerated.   -BB: Metoprolol dose reduced and changed to succinate given low BP and volume overload. Continue metoprolol succinate 12.5 mg daily. -ACEI/ARB/ARNI: Not a candidate currently due to midodrine requirement. -MRA: Currently on spironolactone 12.5 mg daily. Continue for now. -SGLT2i: Not a candidate due to frequent UTIs. -Prior PVC ablation. Amiodarone added.  Plan: 1) Medication changes recommended at this time: -Consider increasing furosemide to 40 mg IV twice daily   2) Patient assistance: -Pending  3) Education: - Patient has been educated on current HF medications and potential additions to HF medication regimen - Patient verbalizes understanding that over the next few months, these medication doses may change and more medications may be added to optimize HF regimen - Patient has been educated on basic disease state pathophysiology and goals of therapy  Medication Assistance / Insurance Benefits Check: Does the patient have prescription insurance?   Yes  Type of insurance plan:  Does the patient qualify for medication assistance through manufacturers or grants? Pending   Outpatient Pharmacy: Prior to admission outpatient pharmacy: [facility] Is the patient agreeable to switch to [facility] Outpatient [facility]?: Yes Is the patient willing to utilize a [facility] pharmacy at discharge?: Yes Patient would like to enroll in [facility] mail order pharmacy program.   Please do not hesitate to reach out with questions or concerns,  Enos Fling, PharmD, CPP, BCPS Heart Failure  Pharmacist  Phone - [phone] 03/15/2023 8:42 AM

## 2023-03-15 NOTE — Progress Notes (Addendum)
 Progress Note    Wendy Sanchez  ZOX:[id number] DOB: 1950-07-11  DOA: 03/07/2023 PCP: Pcp, No      Brief Narrative:    Medical records reviewed and are as summarized below:  Wendy Sanchez is a 73 y.o. female  with medical history significant of COPD, CHF with EF of 40-45%, depression, obesity, anemia, DVT and PE on Eliquis, temporal arteritis, renal cell carcinoma, adrenal insufficiency, s/p for colostomy secondary to perforated diverticulitis, who presented to the hospital with fever and increased urinary frequency.  Patient was found to have hypotension with blood pressure 86/59, which improved to 101/70 after giving 2 L LR bolus in ED, but blood pressure  dropped again to 82/61 with MAP of 69.  WBC 10.3, GFR> 60, negative PCR for COVID, flu and RSV, UA (hazy appearance, small amount of leukocyte, rare bacteria, WBC 11-20), lactic acid of 1.7 --> 2.2 --> 1.5, troponin 217 --> 251. Chest x-ray negative for infiltration.    2/24: Blood pressure with some improvement to 97/63, patient was started on midodrine and stress doses of steroid.  Elevated leukocytosis today likely secondary to stress doses of steroid.  Preliminary blood cultures negative.  Urine cultures pending.  Troponin peaked at 251 and now trending down.   Family is very concerned about this recurrent UTI, patient has multiple hospitalization for this and has been seen outpatient urology, there were recommending continuing antibiotics at suppressive doses.   2/25: Vital stable.  Blood pressure seems within goal now.  Mild hypokalemia which are being repleted.  Blood cultures remain negative, urine cultures with no growth.  Stopping antibiotics.  Sepsis ruled out.  Increasing the dose of midodrine. Had an episode of sinus tachycardia, restarting home metoprolol and torsemide.  PT is recommending SNF, not sure if she has any days left   2/26: Vital stable with blood pressure in low 100s.  Echo with EF of 35 to 40%, left  ventricular global hypokinesis and severely dilated cavity, grade 1 diastolic dysfunction.  Small pericardial effusion. Patient currently stable to go back to her facility, she needs another insurance authorization which was started today.  She will go back once insurance authorization got approval.  Palliative care team was consulted because of multiple comorbidities and high risk for readmissions and mortality     Assessment/Plan:   Principal Problem:   Hypotension Active Problems:   Secondary adrenal insufficiency (HCC)   Myocardial injury   Chronic obstructive pulmonary disease (COPD) (HCC)   Chronic pain syndrome   HFrEF (heart failure with reduced ejection fraction) (HCC)   History of pulmonary embolus 2020(PE)   Depression with anxiety   Obesity (BMI 30-39.9)   Pressure injury of skin   Sepsis (HCC)   Nonischemic cardiomyopathy (HCC)   Acute on chronic HFrEF (heart failure with reduced ejection fraction) (HCC)   SVT (supraventricular tachycardia) (HCC)    Body mass index is 40.63 kg/m.  (Class II obesity)    Hypotension, secondary adrenal insufficiency: BP stable.  Continue midodrine and hydrocortisone.  Continue pyridostigmine. S/p treatment with stress doses of IV steroids. Sepsis has been ruled out.  No evidence of UTI or pneumonia.   Acute on chronic HFrEF, hypervolemia, valvular heart disease (mild to moderate TR, mild MR, mild aortic stenosis): Continue IV Lasix if BP allows.  Continue spironolactone.  Monitor BMP, daily weight and urine output. Follow-up with cardiologist. BNP was 193.3 2D echo showed EF estimated at 35 to 40%, mild LVH, grade 1 diastolic dysfunction  mildly enlarged RV size, mildly dilated left atrium size, small pericardial effusion, mild MR, mild to moderate TR, mild aortic stenosis Previous 2D echo in September 2024 showed EF estimated at 40 to 45% History of low EF of 20% in 2019 thought to be due to frequent PVCs but she did not have  significant improvement in EF following PVC ablation at that time.   Elevated troponins: Troponin was 251 on admission and dropped to 61.   She has been seen by the cardiologist.  No plan for ischemic workup at this time. She has a history of elevated troponins   Paroxysmal SVT: Continue Toprol-XL and amiodarone.   History of pulmonary embolism in 2020: Continue Eliquis   Stage II decubitus ulcers on bilateral ischial tuberosity (present on admission): Continue local wound care   Comorbidities include COPD, chronic pain syndrome, depression, anxiety,      Diet Order             DIET DYS 3 Room service appropriate? Yes; Fluid consistency: Thin  Diet effective now                            Consultants: Cardiologist  Procedures: None    Medications:    amiodarone  400 mg Oral BID   apixaban  5 mg Oral BID   ascorbic acid  500 mg Oral Daily   aspirin EC  81 mg Oral Daily   calcium carbonate  2 tablet Oral Q breakfast   cholecalciferol  1,000 Units Oral Daily   cyanocobalamin  1,000 mcg Oral Daily   famotidine  20 mg Oral BID WC   ferrous sulfate  325 mg Oral BID WC   fluticasone  1 spray Each Nare Daily   folic acid  1 mg Oral Daily   furosemide  20 mg Intravenous BID   gabapentin  300 mg Oral TID   hydrocortisone  15 mg Oral Q breakfast   hydrocortisone  5 mg Oral BID AC   lactose free nutrition  237 mL Oral TID BM   loratadine  10 mg Oral QPM   metoprolol succinate  12.5 mg Oral Daily   midodrine  15 mg Oral TID WC   montelukast  10 mg Oral Daily   nortriptyline  50 mg Oral QHS   pantoprazole  40 mg Oral Q breakfast   polyvinyl alcohol  1 drop Both Eyes TID   pyridostigmine  30 mg Oral BID   spironolactone  12.5 mg Oral Daily   thiamine  100 mg Oral Daily   vitamin A  10,000 Units Oral Daily   Continuous Infusions:   Anti-infectives (From admission, onward)    Start     Dose/Rate Route Frequency Ordered Stop   03/08/23 2100   vancomycin (VANCOREADY) IVPB 1500 mg/300 mL  Status:  Discontinued        1,500 mg 150 mL/hr over 120 Minutes Intravenous Every 24 hours 03/08/23 0111 03/08/23 0711   03/08/23 2100  vancomycin (VANCOREADY) IVPB 1750 mg/350 mL  Status:  Discontinued        1,750 mg 175 mL/hr over 120 Minutes Intravenous Every 24 hours 03/08/23 0711 03/09/23 1122   03/08/23 0400  meropenem (MERREM) 1 g in sodium chloride 0.9 % 100 mL IVPB  Status:  Discontinued        1 g 200 mL/hr over 30 Minutes Intravenous Every 8 hours 03/08/23 0013 03/09/23 1122   03/08/23 0015  vancomycin (VANCOREADY) IVPB 1500 mg/300 mL        1,500 mg 150 mL/hr over 120 Minutes Intravenous  Once 03/08/23 0004 03/08/23 0238   03/07/23 2045  ceFEPIme (MAXIPIME) 2 g in sodium chloride 0.9 % 100 mL IVPB        2 g 200 mL/hr over 30 Minutes Intravenous  Once 03/07/23 2032 03/07/23 2105   03/07/23 2045  vancomycin (VANCOCIN) IVPB 1000 mg/200 mL premix        1,000 mg 200 mL/hr over 60 Minutes Intravenous  Once 03/07/23 2032 03/07/23 2207              Family Communication/Anticipated D/C date and plan/Code Status   DVT prophylaxis:  apixaban (ELIQUIS) tablet 5 mg     Code Status: Full Code  Family Communication: None Disposition Plan: Plan to discharge to SNF   Status is: Inpatient Remains inpatient appropriate because: CHF exacerbation       Subjective:   Interval events noted.  No shortness of breath or chest pain.  She feels better today.  She still has some swelling in the legs.  Objective:    Vitals:   03/14/23 2114 03/14/23 2341 03/15/23 0355 03/15/23 0815  BP: 106/64 109/69 94/66 (!) 95/56  Pulse: 93 93 91 87  Resp: 18   18  Temp: 99.1 F (37.3 C) 98.7 F (37.1 C) 98.4 F (36.9 C) 97.9 F (36.6 C)  TempSrc: Oral Oral Oral Oral  SpO2: 100% 100% 98% 100%  Weight:      Height:       No data found.   Intake/Output Summary (Last 24 hours) at 03/15/2023 1516 Last data filed at 03/15/2023  1100 Gross per 24 hour  Intake 600 ml  Output 751 ml  Net -151 ml   Filed Weights   03/12/23 0500 03/13/23 0500 03/14/23 0500  Weight: 120.3 kg 119.4 kg 121.2 kg    Exam:  GEN: NAD SKIN: Warm and dry EYES: No pallor or icterus ENT: MMM CV: RRR PULM: CTA B ABD: soft, obese, NT, +BS CNS: AAO x 3, non focal EXT: Bilateral lower extremity edema, no erythema or tenderness       Data Reviewed:   I have personally reviewed following labs and imaging studies:  Labs: Labs show the following:   Basic Metabolic Panel: Recent Labs  Lab 03/09/23 0434 03/10/23 0410 03/13/23 0511 03/15/23 0520  NA 137 136 136 135  K 3.3* 3.6 3.5 3.9  CL 102 103 99 99  CO2 25 26 30 30   GLUCOSE 115* 102* 83 87  BUN 27* 26* 19 19  CREATININE 0.79 0.68 0.60 0.58  CALCIUM 7.4* 7.3* 8.1* 8.1*  MG 2.0  --  1.9 1.9   GFR Estimated Creatinine Clearance: 88.4 mL/min (by C-G formula based on SCr of 0.58 mg/dL). Liver Function Tests: No results for input(s): "AST", "ALT", "ALKPHOS", "BILITOT", "PROT", "ALBUMIN" in the last 168 hours.  No results for input(s): "LIPASE", "AMYLASE" in the last 168 hours. No results for input(s): "AMMONIA" in the last 168 hours. Coagulation profile No results for input(s): "INR", "PROTIME" in the last 168 hours.   CBC: Recent Labs  Lab 03/09/23 0434 03/10/23 0410 03/15/23 0520  WBC 9.6 7.7 8.0  HGB 8.4* 8.1* 8.6*  HCT 25.0* 24.2* 26.1*  MCV 83.3 83.4 86.1  PLT 595* 574* 615*   Cardiac Enzymes: No results for input(s): "CKTOTAL", "CKMB", "CKMBINDEX", "TROPONINI" in the last 168 hours. BNP (last 3 results) No results for  input(s): "PROBNP" in the last 8760 hours. CBG: No results for input(s): "GLUCAP" in the last 168 hours. D-Dimer: No results for input(s): "DDIMER" in the last 72 hours. Hgb A1c: No results for input(s): "HGBA1C" in the last 72 hours. Lipid Profile: No results for input(s): "CHOL", "HDL", "LDLCALC", "TRIG", "CHOLHDL", "LDLDIRECT"  in the last 72 hours. Thyroid function studies: No results for input(s): "TSH", "T4TOTAL", "T3FREE", "THYROIDAB" in the last 72 hours.  Invalid input(s): "FREET3" Anemia work up: No results for input(s): "VITAMINB12", "FOLATE", "FERRITIN", "TIBC", "IRON", "RETICCTPCT" in the last 72 hours. Sepsis Labs: Recent Labs  Lab 03/09/23 0434 03/10/23 0410 03/15/23 0520  WBC 9.6 7.7 8.0    Microbiology Recent Results (from the past 240 hours)  Resp panel by RT-PCR (RSV, Flu A&B, Covid) Anterior Nasal Swab     Status: None   Collection Time: 03/07/23  8:35 PM   Specimen: Anterior Nasal Swab  Result Value Ref Range Status   SARS Coronavirus 2 by RT PCR NEGATIVE NEGATIVE Final    Comment: (NOTE) SARS-CoV-2 target nucleic acids are NOT DETECTED.  The SARS-CoV-2 RNA is generally detectable in upper respiratory specimens during the acute phase of infection. The lowest concentration of SARS-CoV-2 viral copies this assay can detect is 138 copies/mL. A negative result does not preclude SARS-Cov-2 infection and should not be used as the sole basis for treatment or other patient management decisions. A negative result may occur with  improper specimen collection/handling, submission of specimen other than nasopharyngeal swab, presence of viral mutation(s) within the areas targeted by this assay, and inadequate number of viral copies(<138 copies/mL). A negative result must be combined with clinical observations, patient history, and epidemiological information. The expected result is Negative.  Fact Sheet for Patients:  BloggerCourse.com  Fact Sheet for Healthcare Providers:  SeriousBroker.it  This test is no t yet approved or cleared by the [place] FDA and  has been authorized for detection and/or diagnosis of SARS-CoV-2 by FDA under an Emergency Use Authorization (EUA). This EUA will remain  in effect (meaning this test can be used)  for the duration of the COVID-19 declaration under Section 564(b)(1) of the Act, 21 U.S.C.section 360bbb-3(b)(1), unless the authorization is terminated  or revoked sooner.       Influenza A by PCR NEGATIVE NEGATIVE Final   Influenza B by PCR NEGATIVE NEGATIVE Final    Comment: (NOTE) The Xpert Xpress SARS-CoV-2/FLU/RSV plus assay is intended as an aid in the diagnosis of influenza from Nasopharyngeal swab specimens and should not be used as a sole basis for treatment. Nasal washings and aspirates are unacceptable for Xpert Xpress SARS-CoV-2/FLU/RSV testing.  Fact Sheet for Patients: BloggerCourse.com  Fact Sheet for Healthcare Providers: SeriousBroker.it  This test is not yet approved or cleared by the [place] FDA and has been authorized for detection and/or diagnosis of SARS-CoV-2 by FDA under an Emergency Use Authorization (EUA). This EUA will remain in effect (meaning this test can be used) for the duration of the COVID-19 declaration under Section 564(b)(1) of the Act, 21 U.S.C. section 360bbb-3(b)(1), unless the authorization is terminated or revoked.     Resp Syncytial Virus by PCR NEGATIVE NEGATIVE Final    Comment: (NOTE) Fact Sheet for Patients: BloggerCourse.com  Fact Sheet for Healthcare Providers: SeriousBroker.it  This test is not yet approved or cleared by the [place] FDA and has been authorized for detection and/or diagnosis of SARS-CoV-2 by FDA under an Emergency Use Authorization (EUA). This EUA will remain in effect (  meaning this test can be used) for the duration of the COVID-19 declaration under Section 564(b)(1) of the Act, 21 U.S.C. section 360bbb-3(b)(1), unless the authorization is terminated or revoked.  Performed at [facility], [address] [place], [place] [address]   Blood Culture (routine x 2)     Status: None    Collection Time: 03/07/23  8:35 PM   Specimen: BLOOD  Result Value Ref Range Status   Specimen Description BLOOD BLOOD LEFT HAND  Final   Special Requests   Final    BOTTLES DRAWN AEROBIC AND ANAEROBIC Blood Culture results may not be optimal due to an excessive volume of blood received in culture bottles   Culture   Final    NO GROWTH 5 DAYS Performed at [facility], [address] [place], [place] [address]    Report Status 03/12/2023 FINAL  Final  Blood Culture (routine x 2)     Status: None   Collection Time: 03/07/23  8:36 PM   Specimen: BLOOD  Result Value Ref Range Status   Specimen Description BLOOD BLOOD LEFT ARM  Final   Special Requests   Final    BOTTLES DRAWN AEROBIC AND ANAEROBIC Blood Culture results may not be optimal due to an excessive volume of blood received in culture bottles   Culture   Final    NO GROWTH 5 DAYS Performed at [facility], [address], [place], [place] [address]    Report Status 03/12/2023 FINAL  Final  Urine Culture     Status: None   Collection Time: 03/07/23  9:26 PM   Specimen: Urine, Random  Result Value Ref Range Status   Specimen Description   Final    URINE, RANDOM Performed at [facility], [address] [place], [place] [address]    Special Requests   Final    NONE Reflexed from [id number] Performed at [facility], [address], [place], [place] [address]    Culture   Final    NO GROWTH Performed at [facility], [address] [place], [place] [address]    Report Status 03/09/2023 FINAL  Final    Procedures and diagnostic studies:  No results found.              LOS: 8 days   Anurag Scarfo  [facility]   Pager on [email or web address]. If 7PM-7AM, please contact night-coverage at [email or web address]     03/15/2023, 3:16 PM

## 2023-03-15 NOTE — Progress Notes (Signed)
 Physical Therapy Treatment Patient Details Name: Wendy Sanchez MRN: [id number] DOB: 25-Jul-1950 Today's Date: 03/15/2023   History of Present Illness Pt is a 73 y.o. female admitted with sepsis, adrenal insufficiency, NSTEMI and recurrent UTI. PMH significant for COPD, T2 compression fx, OA, CHF with EF of 40-45%, depression, obesity, anemia, DVT and PE on Eliquis, temporal arteritis, renal cell carcinoma, adrenal insufficiency, s/p for colostomy secondary to perforated diverticulitis.    PT Comments  Patient is agreeable to PT session and is eager to mobilize. Max A of one person required for bed mobility. Max A also required for lateral scooting transfer. Patient will need +2 person assistance for standing attempts. Recommend to continue PT to maximize independence. Patient will need rehabilitation < 3 hours/day after this hospital stay.    If plan is discharge home, recommend the following: Two people to help with walking and/or transfers;A lot of help with bathing/dressing/bathroom;Help with stairs or ramp for entrance;Assist for transportation   Can travel by private vehicle     No  Equipment Recommendations    To be determined at next level of care   Recommendations for Other Services       Precautions / Restrictions Precautions Precautions: Fall Recall of Precautions/Restrictions: Intact Precaution/Restrictions Comments: monitor HR Restrictions Weight Bearing Restrictions Per Provider Order: No     Mobility  Bed Mobility Overal bed mobility: Needs Assistance Bed Mobility: Supine to Sit, Sit to Supine     Supine to sit: Max assist Sit to supine: Max assist   General bed mobility comments: assistance for LE and trunk support. cues for technique    Transfers Overall transfer level: Needs assistance   Transfers: Bed to chair/wheelchair/BSC            Lateral/Scoot Transfers: Max assist General transfer comment: cues for anterior weight shifting for LE weight  bearing in preparation for standing. Max A for lateral scooting x 2 bouts with cues for technique. Patient will need +2 person for any standing attempts    Ambulation/Gait                   Stairs             Wheelchair Mobility     Tilt Bed    Modified Rankin (Stroke Patients Only)       Balance Overall balance assessment: Needs assistance Sitting-balance support: Feet supported Sitting balance-Leahy Scale: Fair                                      Hotel manager: No apparent difficulties  Cognition Arousal: Alert Behavior During Therapy: WFL for tasks assessed/performed   PT - Cognitive impairments: No apparent impairments                         Following commands: Intact      Cueing Cueing Techniques: Verbal cues  Exercises General Exercises - Lower Extremity Long Arc Quad: AROM, Strengthening, Both, 10 reps, Seated    General Comments General comments (skin integrity, edema, etc.): heart rate no more than 109bpm with seated level activity. 95bpm at rest.      Pertinent Vitals/Pain Pain Assessment Pain Assessment: Faces Faces Pain Scale: Hurts a little bit Pain Location: headache Pain Descriptors / Indicators: Discomfort Pain Intervention(s): Limited activity within patient's tolerance, Monitored during session, Repositioned    Home Living  Prior Function            PT Goals (current goals can now be found in the care plan section) Acute Rehab PT Goals Patient Stated Goal: to get better PT Goal Formulation: With patient Time For Goal Achievement: 03/23/23 Potential to Achieve Goals: Good Progress towards PT goals: Progressing toward goals    Frequency    Min 1X/week      PT Plan      Co-evaluation              AM-PAC PT "6 Clicks" Mobility   Outcome Measure  Help needed turning from your back to your side while in a flat bed  without using bedrails?: A Little Help needed moving from lying on your back to sitting on the side of a flat bed without using bedrails?: A Lot Help needed moving to and from a bed to a chair (including a wheelchair)?: Total Help needed standing up from a chair using your arms (e.g., wheelchair or bedside chair)?: A Lot Help needed to walk in hospital room?: Total Help needed climbing 3-5 steps with a railing? : Total 6 Click Score: 10    End of Session   Activity Tolerance: Patient limited by fatigue Patient left: in bed;with call bell/phone within reach;with bed alarm set   PT Visit Diagnosis: Muscle weakness (generalized) (M62.81);Unsteadiness on feet (R26.81);Difficulty in walking, not elsewhere classified (R26.2)     Time: 1308-6578 PT Time Calculation (min) (ACUTE ONLY): 14 min  Charges:    $Therapeutic Activity: 8-22 mins PT General Charges $$ ACUTE PT VISIT: 1 Visit                     Donna Bernard, PT, MPT    Ina Homes 03/15/2023, 11:21 AM

## 2023-03-15 NOTE — Plan of Care (Signed)

## 2023-03-15 NOTE — Plan of Care (Signed)

## 2023-03-16 ENCOUNTER — Encounter
Admission: EM | Disposition: A | Discharge status: Home Health | Payer: Self-pay | Source: Skilled Nursing Facility | Attending: Internal Medicine

## 2023-03-16 ENCOUNTER — Inpatient Hospital Stay

## 2023-03-16 ENCOUNTER — Other Ambulatory Visit: Payer: Self-pay

## 2023-03-16 DIAGNOSIS — I3139 Other pericardial effusion (noninflammatory): Secondary | ICD-10-CM | POA: Diagnosis not present

## 2023-03-16 DIAGNOSIS — I350 Nonrheumatic aortic (valve) stenosis: Secondary | ICD-10-CM | POA: Diagnosis not present

## 2023-03-16 DIAGNOSIS — D649 Anemia, unspecified: Secondary | ICD-10-CM

## 2023-03-16 DIAGNOSIS — I959 Hypotension, unspecified: Secondary | ICD-10-CM | POA: Diagnosis not present

## 2023-03-16 DIAGNOSIS — I5023 Acute on chronic systolic (congestive) heart failure: Secondary | ICD-10-CM | POA: Diagnosis not present

## 2023-03-16 HISTORY — PX: RIGHT/LEFT HEART CATH AND CORONARY ANGIOGRAPHY: CATH118266

## 2023-03-16 LAB — MULTIPLE MYELOMA PANEL, SERUM
Albumin SerPl Elph-Mcnc: 1.6 g/dL — ABNORMAL LOW (ref 2.9–4.4)
Albumin/Glob SerPl: 0.6 — ABNORMAL LOW (ref 0.7–1.7)
Alpha 1: 0.2 g/dL (ref 0.0–0.4)
Alpha2 Glob SerPl Elph-Mcnc: 0.9 g/dL (ref 0.4–1.0)
B-Globulin SerPl Elph-Mcnc: 0.7 g/dL (ref 0.7–1.3)
Gamma Glob SerPl Elph-Mcnc: 0.9 g/dL (ref 0.4–1.8)
Globulin, Total: 2.7 g/dL (ref 2.2–3.9)
IgA: 313 mg/dL (ref 64–422)
IgG (Immunoglobin G), Serum: 1073 mg/dL (ref 586–1602)
IgM (Immunoglobulin M), Srm: 69 mg/dL (ref 26–217)
Total Protein ELP: 4.3 g/dL — ABNORMAL LOW (ref 6.0–8.5)

## 2023-03-16 LAB — CBC
HCT: 28.4 % — ABNORMAL LOW (ref 36.0–46.0)
Hemoglobin: 9.5 g/dL — ABNORMAL LOW (ref 12.0–15.0)
MCH: 28.5 pg (ref 26.0–34.0)
MCHC: 33.5 g/dL (ref 30.0–36.0)
MCV: 85.3 fL (ref 80.0–100.0)
Platelets: 677 10*3/uL — ABNORMAL HIGH (ref 150–400)
RBC: 3.33 MIL/uL — ABNORMAL LOW (ref 3.87–5.11)
RDW: 16 % — ABNORMAL HIGH (ref 11.5–15.5)
WBC: 21.2 10*3/uL — ABNORMAL HIGH (ref 4.0–10.5)
nRBC: 0 % (ref 0.0–0.2)

## 2023-03-16 LAB — POCT I-STAT EG7
Acid-Base Excess: 8 mmol/L — ABNORMAL HIGH (ref 0.0–2.0)
Acid-Base Excess: 9 mmol/L — ABNORMAL HIGH (ref 0.0–2.0)
Bicarbonate: 31.7 mmol/L — ABNORMAL HIGH (ref 20.0–28.0)
Bicarbonate: 32.6 mmol/L — ABNORMAL HIGH (ref 20.0–28.0)
Calcium, Ion: 1.13 mmol/L — ABNORMAL LOW (ref 1.15–1.40)
Calcium, Ion: 1.13 mmol/L — ABNORMAL LOW (ref 1.15–1.40)
HCT: 29 % — ABNORMAL LOW (ref 36.0–46.0)
HCT: 29 % — ABNORMAL LOW (ref 36.0–46.0)
Hemoglobin: 9.9 g/dL — ABNORMAL LOW (ref 12.0–15.0)
Hemoglobin: 9.9 g/dL — ABNORMAL LOW (ref 12.0–15.0)
O2 Saturation: 63 %
O2 Saturation: 63 %
Potassium: 3.2 mmol/L — ABNORMAL LOW (ref 3.5–5.1)
Potassium: 3.3 mmol/L — ABNORMAL LOW (ref 3.5–5.1)
Sodium: 134 mmol/L — ABNORMAL LOW (ref 135–145)
Sodium: 134 mmol/L — ABNORMAL LOW (ref 135–145)
TCO2: 33 mmol/L — ABNORMAL HIGH (ref 22–32)
TCO2: 34 mmol/L — ABNORMAL HIGH (ref 22–32)
pCO2, Ven: 38.2 mmHg — ABNORMAL LOW (ref 44–60)
pCO2, Ven: 38.8 mmHg — ABNORMAL LOW (ref 44–60)
pH, Ven: 7.52 — ABNORMAL HIGH (ref 7.25–7.43)
pH, Ven: 7.54 — ABNORMAL HIGH (ref 7.25–7.43)
pO2, Ven: 29 mmHg — CL (ref 32–45)
pO2, Ven: 29 mmHg — CL (ref 32–45)

## 2023-03-16 LAB — RENAL FUNCTION PANEL
Albumin: 1.5 g/dL — ABNORMAL LOW (ref 3.5–5.0)
Anion gap: 6 (ref 5–15)
BUN: 18 mg/dL (ref 8–23)
CO2: 32 mmol/L (ref 22–32)
Calcium: 7.9 mg/dL — ABNORMAL LOW (ref 8.9–10.3)
Chloride: 95 mmol/L — ABNORMAL LOW (ref 98–111)
Creatinine, Ser: 0.62 mg/dL (ref 0.44–1.00)
GFR, Estimated: >60 mL/min (ref >60)
Glucose, Bld: 79 mg/dL (ref 70–99)
Phosphorus: 3.4 mg/dL (ref 2.5–4.6)
Potassium: 3.6 mmol/L (ref 3.5–5.1)
Sodium: 133 mmol/L — ABNORMAL LOW (ref 135–145)

## 2023-03-16 LAB — BASIC METABOLIC PANEL
Anion gap: 7 (ref 5–15)
BUN: 19 mg/dL (ref 8–23)
CO2: 31 mmol/L (ref 22–32)
Calcium: 7.9 mg/dL — ABNORMAL LOW (ref 8.9–10.3)
Chloride: 96 mmol/L — ABNORMAL LOW (ref 98–111)
Creatinine, Ser: 0.6 mg/dL (ref 0.44–1.00)
GFR, Estimated: >60 mL/min (ref >60)
Glucose, Bld: 75 mg/dL (ref 70–99)
Potassium: 3.7 mmol/L (ref 3.5–5.1)
Sodium: 134 mmol/L — ABNORMAL LOW (ref 135–145)

## 2023-03-16 LAB — GLUCOSE, CAPILLARY: Glucose-Capillary: 78 mg/dL (ref 70–99)

## 2023-03-16 LAB — CREATININE, SERUM
Creatinine, Ser: 0.62 mg/dL (ref 0.44–1.00)
GFR, Estimated: >60 mL/min (ref >60)

## 2023-03-16 SURGERY — RIGHT/LEFT HEART CATH AND CORONARY ANGIOGRAPHY
Anesthesia: Moderate Sedation

## 2023-03-16 MED ORDER — SODIUM CHLORIDE 0.9 % IV SOLN
INTRAVENOUS | Status: AC | PRN
  Administered 2023-03-16: 250 mL via INTRAVENOUS

## 2023-03-16 MED ORDER — LIDOCAINE HCL (PF) 1 % IJ SOLN
INTRAMUSCULAR | Status: DC | PRN
  Administered 2023-03-16 (×2): 2 mL

## 2023-03-16 MED ORDER — ENOXAPARIN SODIUM 40 MG/0.4ML IJ SOSY: 40.0000 mg | PREFILLED_SYRINGE | INTRAMUSCULAR | Status: DC

## 2023-03-16 MED ORDER — HEPARIN SODIUM (PORCINE) 1000 UNIT/ML IJ SOLN
INTRAMUSCULAR | Status: DC | PRN
  Administered 2023-03-16: 5000 [IU] via INTRAVENOUS

## 2023-03-16 MED ORDER — ACETAMINOPHEN 325 MG PO TABS: 650.0000 mg | ORAL_TABLET | ORAL | Status: DC | PRN

## 2023-03-16 MED ORDER — HEPARIN (PORCINE) IN NACL 2000-0.9 UNIT/L-% IV SOLN
INTRAVENOUS | Status: DC | PRN
  Administered 2023-03-16: 1000 mL

## 2023-03-16 MED ORDER — AMIODARONE HCL 200 MG PO TABS
200.0000 mg | ORAL_TABLET | Freq: Two times a day (BID) | ORAL | Status: DC
  Administered 2023-03-16 – 2023-03-18 (×5): 200 mg via ORAL
  Filled 2023-03-16 (×5): qty 1

## 2023-03-16 MED ORDER — SODIUM CHLORIDE 0.9% FLUSH: 3.0000 mL | INTRAVENOUS | Status: DC | PRN

## 2023-03-16 MED ORDER — LABETALOL HCL 5 MG/ML IV SOLN: 10.0000 mg | INTRAVENOUS | Status: AC | PRN

## 2023-03-16 MED ORDER — VERAPAMIL HCL 2.5 MG/ML IV SOLN
INTRAVENOUS | Status: DC | PRN
  Administered 2023-03-16: 2.5 mg via INTRA_ARTERIAL

## 2023-03-16 MED ORDER — FENTANYL CITRATE (PF) 100 MCG/2ML IJ SOLN
INTRAMUSCULAR | Status: AC
  Filled 2023-03-16: qty 2

## 2023-03-16 MED ORDER — SODIUM CHLORIDE 0.9 % IV SOLN: 250.0000 mL | INTRAVENOUS | Status: AC | PRN

## 2023-03-16 MED ORDER — SODIUM CHLORIDE 0.9% FLUSH
3.0000 mL | Freq: Two times a day (BID) | INTRAVENOUS | Status: DC
  Administered 2023-03-16 – 2023-03-25 (×19): 3 mL via INTRAVENOUS

## 2023-03-16 MED ORDER — HEPARIN (PORCINE) IN NACL 1000-0.9 UT/500ML-% IV SOLN
INTRAVENOUS | Status: AC
  Filled 2023-03-16: qty 1000

## 2023-03-16 MED ORDER — POTASSIUM CHLORIDE CRYS ER 20 MEQ PO TBCR
40.0000 meq | EXTENDED_RELEASE_TABLET | Freq: Once | ORAL | Status: AC
  Administered 2023-03-16: 40 meq via ORAL
  Filled 2023-03-16: qty 2

## 2023-03-16 MED ORDER — SODIUM CHLORIDE 0.9 % IV SOLN: INTRAVENOUS | Status: AC

## 2023-03-16 MED ORDER — HEPARIN SODIUM (PORCINE) 1000 UNIT/ML IJ SOLN
INTRAMUSCULAR | Status: AC
  Filled 2023-03-16: qty 10

## 2023-03-16 MED ORDER — ONDANSETRON HCL 4 MG/2ML IJ SOLN: 4.0000 mg | Freq: Four times a day (QID) | INTRAMUSCULAR | Status: DC | PRN

## 2023-03-16 MED ORDER — MIDAZOLAM HCL 2 MG/2ML IJ SOLN
INTRAMUSCULAR | Status: AC
  Filled 2023-03-16: qty 2

## 2023-03-16 MED ORDER — APIXABAN 5 MG PO TABS
5.0000 mg | ORAL_TABLET | Freq: Two times a day (BID) | ORAL | Status: DC
  Administered 2023-03-17 – 2023-03-27 (×21): 5 mg via ORAL
  Filled 2023-03-16 (×21): qty 1

## 2023-03-16 MED ORDER — MIDAZOLAM HCL 2 MG/2ML IJ SOLN
INTRAMUSCULAR | Status: DC | PRN
  Administered 2023-03-16: .5 mg via INTRAVENOUS

## 2023-03-16 MED ORDER — GADOBUTROL 1 MMOL/ML IV SOLN
15.0000 mL | Freq: Once | INTRAVENOUS | Status: AC | PRN
Start: 2023-03-16 — End: 2023-03-16
  Administered 2023-03-16: 15 mL via INTRAVENOUS

## 2023-03-16 MED ORDER — VERAPAMIL HCL 2.5 MG/ML IV SOLN
INTRAVENOUS | Status: AC
  Filled 2023-03-16: qty 2

## 2023-03-16 MED ORDER — IOHEXOL 300 MG/ML  SOLN
INTRAMUSCULAR | Status: DC | PRN
  Administered 2023-03-16: 46 mL

## 2023-03-16 MED ORDER — FENTANYL CITRATE (PF) 100 MCG/2ML IJ SOLN
INTRAMUSCULAR | Status: DC | PRN
  Administered 2023-03-16: 12.5 ug via INTRAVENOUS

## 2023-03-16 MED ORDER — HYDRALAZINE HCL 20 MG/ML IJ SOLN: 10.0000 mg | INTRAMUSCULAR | Status: AC | PRN

## 2023-03-16 SURGICAL SUPPLY — 13 items
CATH 5FR JL3.5 JR4 ANG PIG MP (CATHETERS) IMPLANT
CATH BALLN WEDGE 5F 110CM (CATHETERS) IMPLANT
DEVICE RAD TR BAND REGULAR (VASCULAR PRODUCTS) IMPLANT
DRAPE BRACHIAL (DRAPES) IMPLANT
GLIDESHEATH SLEND SS 6F .021 (SHEATH) IMPLANT
GUIDEWIRE .025 260CM (WIRE) IMPLANT
GUIDEWIRE INQWIRE 1.5J.035X260 (WIRE) IMPLANT
INQWIRE 1.5J .035X260CM (WIRE) ×1 IMPLANT
PACK CARDIAC CATH (CUSTOM PROCEDURE TRAY) ×1 IMPLANT
PROTECTION STATION PRESSURIZED (MISCELLANEOUS) ×1 IMPLANT
SET ATX-X65L (MISCELLANEOUS) IMPLANT
SHEATH GLIDE SLENDER 4/5FR (SHEATH) IMPLANT
STATION PROTECTION PRESSURIZED (MISCELLANEOUS) IMPLANT

## 2023-03-16 NOTE — Plan of Care (Signed)
   Problem: Clinical Measurements: Goal: Ability to maintain clinical measurements within normal limits will improve Outcome: Progressing Goal: Will remain free from infection Outcome: Progressing Goal: Diagnostic test results will improve Outcome: Progressing   Problem: Activity: Goal: Risk for activity intolerance will decrease Outcome: Progressing

## 2023-03-16 NOTE — Interval H&P Note (Signed)
 History and Physical Interval Note:  03/16/2023 2:44 PM  Wendy Sanchez  has presented today for surgery, with the diagnosis of CHF.  The various methods of treatment have been discussed with the patient and family. After consideration of risks, benefits and other options for treatment, the patient has consented to  Procedure(s): RIGHT/LEFT HEART CATH AND CORONARY ANGIOGRAPHY (N/A) as a surgical intervention.  The patient's history has been reviewed, patient examined, no change in status, stable for surgery.  I have reviewed the patient's chart and labs.  Questions were answered to the patient's satisfaction.     Laurier Jasperson [employer]

## 2023-03-16 NOTE — H&P (View-Only) (Signed)
 Patient ID: Wendy Sanchez, female   DOB: [birth year], 73 y.o.   MRN: [id number]     Advanced Heart Failure Rounding Note  Cardiologist: Julien Nordmann, MD  Chief Complaint: CHF Subjective:    Weight down with diuresis yesterday.  No dyspnea at rest.  Has not been out of bed.    Objective:   Weight Range: 116 kg Body mass index is 38.88 kg/m.   Vital Signs:   Temp:  [97.5 F (36.4 C)-98.6 F (37 C)] 98.3 F (36.8 C) (03/04 0438) Pulse Rate:  [82-88] 85 (03/04 0438) Resp:  [18-20] 20 (03/04 0438) BP: (100-116)/(51-71) 100/64 (03/04 0438) SpO2:  [100 %] 100 % (03/04 0438) Weight:  [409 kg] 116 kg (03/04 0517) Last BM Date : 03/15/23  Weight change: Filed Weights   03/13/23 0500 03/14/23 0500 03/16/23 0517  Weight: 119.4 kg 121.2 kg 116 kg    Intake/Output:   Intake/Output Summary (Last 24 hours) at 03/16/2023 0837 Last data filed at 03/16/2023 8119 Gross per 24 hour  Intake 601.06 ml  Output 1751 ml  Net -1149.94 ml      Physical Exam    General:  Well appearing. No resp difficulty HEENT: Normal Neck: Supple. JVP 8-9. Carotids 2+ bilat; no bruits. No lymphadenopathy or thyromegaly appreciated. Cor: PMI nondisplaced. Regular rate & rhythm. No rubs, gallops or murmurs. Lungs: Clear Abdomen: Soft, nontender, nondistended. No hepatosplenomegaly. No bruits or masses. Good bowel sounds. Extremities: No cyanosis, clubbing, rash.  1+ edema to knees.  Neuro: Alert & orientedx3, cranial nerves grossly intact. moves all 4 extremities w/o difficulty. Affect pleasant   Telemetry   NSR with occasional PVCs (personally reviewed)  Labs    CBC Recent Labs    03/15/23 0520  WBC 8.0  HGB 8.6*  HCT 26.1*  MCV 86.1  PLT 615*   Basic Metabolic Panel Recent Labs    14/78/29 0520 03/16/23 0758 03/16/23 0759  NA 135 134* 133*  K 3.9 3.7 3.6  CL 99 96* 95*  CO2 30 31 32  GLUCOSE 87 75 79  BUN 19 19 18   CREATININE 0.58 0.60 0.62  CALCIUM 8.1* 7.9* 7.9*  MG 1.9   --   --   PHOS  --   --  3.4   Liver Function Tests Recent Labs    03/16/23 0759  ALBUMIN 1.5*   No results for input(s): "LIPASE", "AMYLASE" in the last 72 hours. Cardiac Enzymes No results for input(s): "CKTOTAL", "CKMB", "CKMBINDEX", "TROPONINI" in the last 72 hours.  BNP: BNP (last 3 results) Recent Labs    02/04/23 0523 03/07/23 2035  BNP 315.9* 193.3*    ProBNP (last 3 results) No results for input(s): "PROBNP" in the last 8760 hours.   D-Dimer No results for input(s): "DDIMER" in the last 72 hours. Hemoglobin A1C No results for input(s): "HGBA1C" in the last 72 hours. Fasting Lipid Panel No results for input(s): "CHOL", "HDL", "LDLCALC", "TRIG", "CHOLHDL", "LDLDIRECT" in the last 72 hours. Thyroid Function Tests No results for input(s): "TSH", "T4TOTAL", "T3FREE", "THYROIDAB" in the last 72 hours.  Invalid input(s): "FREET3"  Other results:   Imaging    No results found.   Medications:     Scheduled Medications:  amiodarone  400 mg Oral BID   ascorbic acid  500 mg Oral Daily   calcium carbonate  2 tablet Oral Q breakfast   cholecalciferol  1,000 Units Oral Daily   cyanocobalamin  1,000 mcg Oral Daily   famotidine  20 mg  Oral BID WC   ferrous sulfate  325 mg Oral BID WC   fluticasone  1 spray Each Nare Daily   folic acid  1 mg Oral Daily   furosemide  80 mg Intravenous BID   gabapentin  300 mg Oral TID   hydrocortisone  15 mg Oral Q breakfast   hydrocortisone  5 mg Oral BID AC   lactose free nutrition  237 mL Oral TID BM   loratadine  10 mg Oral QPM   metoprolol succinate  12.5 mg Oral Daily   midodrine  15 mg Oral TID WC   montelukast  10 mg Oral Daily   nortriptyline  50 mg Oral QHS   pantoprazole  40 mg Oral Q breakfast   polyvinyl alcohol  1 drop Both Eyes TID   pyridostigmine  30 mg Oral BID   spironolactone  12.5 mg Oral Daily   thiamine  100 mg Oral Daily   vitamin A  10,000 Units Oral Daily    Infusions:  sodium chloride 10  mL/hr at 03/16/23 0607    PRN Medications: oxyCODONE **AND** acetaminophen, acetaminophen, albuterol, dextromethorphan-guaiFENesin, diphenhydrAMINE, docusate sodium, gadobutrol, melatonin, ondansetron (ZOFRAN) IV    Assessment/Plan   1. Acute on chronic systolic CHF: Echo this admission with EF 35-40%, severe LV dilation, mild LVH, normal RV, mild MR, mild aortic stenosis.  EF was 25-30% by echo in 2018 but improved back to normal range.  Now it has fallen again. GDMT limited by orthostatic hypotension.  She has prominent peripheral edema, not as prominent JVD.  NYHA class IIIb but confounded by deconditioning.  She diuresed well yesterday, weight down.  Still with prominent peripheral edema.   - I think that she needs a RHC to guide therapy.  Will plan on doing this today.  With drop in EF, will also plan for coronary angiography.  I discussed risks/benefits with patient and her sister, she agrees to procedure.  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  Ideally would get a PYP scan, but this may have to be done as an outpatient.  I will order a cardiac MRI, hopefully will do today.  Faint IgA band on SPEP, pending UPEP.  - Place Unna boots.  - Will diurese with Lasix 80 mg IV bid again today, hold only for SBP < 85.  Need to follow I/Os and weights.  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  - She can continue Toprol XL 12.5 mg daily.  - Continue spironolactone 12.5 daily.  - Not a candidate for SGLT2 inhibitors due to frequent UTIs.  2. SVT: She is now on amiodarone with no further SVT runs.  - Decrease amiodarone to 200 mg bid today.  3. PVCs: H/o PVC ablation in 2019.  Still with fairly frequent PVCs on telemetry.  - Now on amiodarone.  4. Giant cell arteritis: Prednisone in past, now on hydrocortisone.  5. H/o diverticulitis with sigmoid colectomy and  colostomy.  6. 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  - Hold apixaban for 2 doses for cath.  - She can stop ASA given Eliquis [place].  7. Elevated troponin: Suspect demand ischemia with volume overload.  However, plan for coronary angiography tomorrow.     Length of Stay: 9  Marca Ancona, MD  03/16/2023, 8:37 AM  Advanced Heart Failure Team Pager [phone] (M-F; 7a - 5p)  Please contact [facility] for night-coverage after hours (5p -7a )  and weekends on [email or web address]

## 2023-03-16 NOTE — Progress Notes (Signed)
 OT Cancellation Note  Patient Details Name: SHANTERRIA FRANTA MRN: [id number] DOB: 1950/07/23   Cancelled Treatment:    Reason Eval/Treat Not Completed: Other (comment) (pt is OTF, OT will re attempt as able) Oleta Mouse, OTD OTR/L  03/16/23, 2:14 PM

## 2023-03-16 NOTE — Progress Notes (Signed)
 Patient ID: Wendy Sanchez, female   DOB: [birth year], 73 y.o.   MRN: [id number]     Advanced Heart Failure Rounding Note  Cardiologist: Julien Nordmann, MD  Chief Complaint: CHF Subjective:    Weight down with diuresis yesterday.  No dyspnea at rest.  Has not been out of bed.    Objective:   Weight Range: 116 kg Body mass index is 38.88 kg/m.   Vital Signs:   Temp:  [97.5 F (36.4 C)-98.6 F (37 C)] 98.3 F (36.8 C) (03/04 0438) Pulse Rate:  [82-88] 85 (03/04 0438) Resp:  [18-20] 20 (03/04 0438) BP: (100-116)/(51-71) 100/64 (03/04 0438) SpO2:  [100 %] 100 % (03/04 0438) Weight:  [409 kg] 116 kg (03/04 0517) Last BM Date : 03/15/23  Weight change: Filed Weights   03/13/23 0500 03/14/23 0500 03/16/23 0517  Weight: 119.4 kg 121.2 kg 116 kg    Intake/Output:   Intake/Output Summary (Last 24 hours) at 03/16/2023 0837 Last data filed at 03/16/2023 8119 Gross per 24 hour  Intake 601.06 ml  Output 1751 ml  Net -1149.94 ml      Physical Exam    General:  Well appearing. No resp difficulty HEENT: Normal Neck: Supple. JVP 8-9. Carotids 2+ bilat; no bruits. No lymphadenopathy or thyromegaly appreciated. Cor: PMI nondisplaced. Regular rate & rhythm. No rubs, gallops or murmurs. Lungs: Clear Abdomen: Soft, nontender, nondistended. No hepatosplenomegaly. No bruits or masses. Good bowel sounds. Extremities: No cyanosis, clubbing, rash.  1+ edema to knees.  Neuro: Alert & orientedx3, cranial nerves grossly intact. moves all 4 extremities w/o difficulty. Affect pleasant   Telemetry   NSR with occasional PVCs (personally reviewed)  Labs    CBC Recent Labs    03/15/23 0520  WBC 8.0  HGB 8.6*  HCT 26.1*  MCV 86.1  PLT 615*   Basic Metabolic Panel Recent Labs    14/78/29 0520 03/16/23 0758 03/16/23 0759  NA 135 134* 133*  K 3.9 3.7 3.6  CL 99 96* 95*  CO2 30 31 32  GLUCOSE 87 75 79  BUN 19 19 18   CREATININE 0.58 0.60 0.62  CALCIUM 8.1* 7.9* 7.9*  MG 1.9   --   --   PHOS  --   --  3.4   Liver Function Tests Recent Labs    03/16/23 0759  ALBUMIN 1.5*   No results for input(s): "LIPASE", "AMYLASE" in the last 72 hours. Cardiac Enzymes No results for input(s): "CKTOTAL", "CKMB", "CKMBINDEX", "TROPONINI" in the last 72 hours.  BNP: BNP (last 3 results) Recent Labs    02/04/23 0523 03/07/23 2035  BNP 315.9* 193.3*    ProBNP (last 3 results) No results for input(s): "PROBNP" in the last 8760 hours.   D-Dimer No results for input(s): "DDIMER" in the last 72 hours. Hemoglobin A1C No results for input(s): "HGBA1C" in the last 72 hours. Fasting Lipid Panel No results for input(s): "CHOL", "HDL", "LDLCALC", "TRIG", "CHOLHDL", "LDLDIRECT" in the last 72 hours. Thyroid Function Tests No results for input(s): "TSH", "T4TOTAL", "T3FREE", "THYROIDAB" in the last 72 hours.  Invalid input(s): "FREET3"  Other results:   Imaging    No results found.   Medications:     Scheduled Medications:  amiodarone  400 mg Oral BID   ascorbic acid  500 mg Oral Daily   calcium carbonate  2 tablet Oral Q breakfast   cholecalciferol  1,000 Units Oral Daily   cyanocobalamin  1,000 mcg Oral Daily   famotidine  20 mg  Oral BID WC   ferrous sulfate  325 mg Oral BID WC   fluticasone  1 spray Each Nare Daily   folic acid  1 mg Oral Daily   furosemide  80 mg Intravenous BID   gabapentin  300 mg Oral TID   hydrocortisone  15 mg Oral Q breakfast   hydrocortisone  5 mg Oral BID AC   lactose free nutrition  237 mL Oral TID BM   loratadine  10 mg Oral QPM   metoprolol succinate  12.5 mg Oral Daily   midodrine  15 mg Oral TID WC   montelukast  10 mg Oral Daily   nortriptyline  50 mg Oral QHS   pantoprazole  40 mg Oral Q breakfast   polyvinyl alcohol  1 drop Both Eyes TID   pyridostigmine  30 mg Oral BID   spironolactone  12.5 mg Oral Daily   thiamine  100 mg Oral Daily   vitamin A  10,000 Units Oral Daily    Infusions:  sodium chloride 10  mL/hr at 03/16/23 0607    PRN Medications: oxyCODONE **AND** acetaminophen, acetaminophen, albuterol, dextromethorphan-guaiFENesin, diphenhydrAMINE, docusate sodium, gadobutrol, melatonin, ondansetron (ZOFRAN) IV    Assessment/Plan   1. Acute on chronic systolic CHF: Echo this admission with EF 35-40%, severe LV dilation, mild LVH, normal RV, mild MR, mild aortic stenosis.  EF was 25-30% by echo in 2018 but improved back to normal range.  Now it has fallen again. GDMT limited by orthostatic hypotension.  She has prominent peripheral edema, not as prominent JVD.  NYHA class IIIb but confounded by deconditioning.  She diuresed well yesterday, weight down.  Still with prominent peripheral edema.   - I think that she needs a RHC to guide therapy.  Will plan on doing this today.  With drop in EF, will also plan for coronary angiography.  I discussed risks/benefits with patient and her sister, she agrees to procedure.  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  Ideally would get a PYP scan, but this may have to be done as an outpatient.  I will order a cardiac MRI, hopefully will do today.  Faint IgA band on SPEP, pending UPEP.  - Place Unna boots.  - Will diurese with Lasix 80 mg IV bid again today, hold only for SBP < 85.  Need to follow I/Os and weights.  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  - She can continue Toprol XL 12.5 mg daily.  - Continue spironolactone 12.5 daily.  - Not a candidate for SGLT2 inhibitors due to frequent UTIs.  2. SVT: She is now on amiodarone with no further SVT runs.  - Decrease amiodarone to 200 mg bid today.  3. PVCs: H/o PVC ablation in 2019.  Still with fairly frequent PVCs on telemetry.  - Now on amiodarone.  4. Giant cell arteritis: Prednisone in past, now on hydrocortisone.  5. H/o diverticulitis with sigmoid colectomy and  colostomy.  6. 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  - Hold apixaban for 2 doses for cath.  - She can stop ASA given Eliquis [place].  7. Elevated troponin: Suspect demand ischemia with volume overload.  However, plan for coronary angiography tomorrow.     Length of Stay: 9  Marca Ancona, MD  03/16/2023, 8:37 AM  Advanced Heart Failure Team Pager [phone] (M-F; 7a - 5p)  Please contact [facility] for night-coverage after hours (5p -7a )  and weekends on [email or web address]

## 2023-03-16 NOTE — Plan of Care (Signed)

## 2023-03-16 NOTE — Progress Notes (Signed)
 Pt called out to desk and states that she is ready to sign consent form for heart cath, states that she prayed about it and now agreeable . Consent signed and in the chart. Pt has no further concerns at this time.

## 2023-03-16 NOTE — Progress Notes (Addendum)
 Progress Note    Wendy Sanchez  GNF:[id number] DOB: 01-28-1950  DOA: 03/07/2023 PCP: Pcp, No      Brief Narrative:    Medical records reviewed and are as summarized below:  Wendy Sanchez is a 73 y.o. female  with medical history significant of COPD, CHF with EF of 40-45%, depression, obesity, anemia, DVT and PE on Eliquis, temporal arteritis, renal cell carcinoma, adrenal insufficiency, s/p for colostomy secondary to perforated diverticulitis, who presented to the hospital with fever and increased urinary frequency.  Patient was found to have hypotension with blood pressure 86/59, which improved to 101/70 after giving 2 L LR bolus in ED, but blood pressure  dropped again to 82/61 with MAP of 69.  WBC 10.3, GFR> 60, negative PCR for COVID, flu and RSV, UA (hazy appearance, small amount of leukocyte, rare bacteria, WBC 11-20), lactic acid of 1.7 --> 2.2 --> 1.5, troponin 217 --> 251. Chest x-ray negative for infiltration.    2/24: Blood pressure with some improvement to 97/63, patient was started on midodrine and stress doses of steroid.  Elevated leukocytosis today likely secondary to stress doses of steroid.  Preliminary blood cultures negative.  Urine cultures pending.  Troponin peaked at 251 and now trending down.   Family is very concerned about this recurrent UTI, patient has multiple hospitalization for this and has been seen outpatient urology, there were recommending continuing antibiotics at suppressive doses.   2/25: Vital stable.  Blood pressure seems within goal now.  Mild hypokalemia which are being repleted.  Blood cultures remain negative, urine cultures with no growth.  Stopping antibiotics.  Sepsis ruled out.  Increasing the dose of midodrine. Had an episode of sinus tachycardia, restarting home metoprolol and torsemide.  PT is recommending SNF, not sure if she has any days left   2/26: Vital stable with blood pressure in low 100s.  Echo with EF of 35 to 40%, left  ventricular global hypokinesis and severely dilated cavity, grade 1 diastolic dysfunction.  Small pericardial effusion. Patient currently stable to go back to her facility, she needs another insurance authorization which was started today.  She will go back once insurance authorization got approval.  Palliative care team was consulted because of multiple comorbidities and high risk for readmissions and mortality     Assessment/Plan:   Principal Problem:   Hypotension Active Problems:   Secondary adrenal insufficiency (HCC)   Myocardial injury   Chronic obstructive pulmonary disease (COPD) (HCC)   Chronic pain syndrome   HFrEF (heart failure with reduced ejection fraction) (HCC)   History of pulmonary embolus 2020(PE)   Depression with anxiety   Obesity (BMI 30-39.9)   Pressure injury of skin   Sepsis (HCC)   Nonischemic cardiomyopathy (HCC)   Acute on chronic HFrEF (heart failure with reduced ejection fraction) (HCC)   SVT (supraventricular tachycardia) (HCC)    Body mass index is 38.88 kg/m.  (Class II obesity)    Hypotension, secondary adrenal insufficiency: BP stable.  Continue midodrine and hydrocortisone.  Continue pyridostigmine. S/p treatment with stress doses of IV steroids. Sepsis has been ruled out.  No evidence of UTI or pneumonia.   Acute on chronic HFrEF, hypervolemia, valvular heart disease (mild to moderate TR, mild MR, mild aortic stenosis): She had cardiac MRI today.  Report is pending.  Plan for right heart cath today.  Follow-up with cardiologist. Continue spironolactone and IV Lasix.  Monitor BMP, daily weight and urine output. BNP was 193.3 2D echo  showed EF estimated at 35 to 40%, mild LVH, grade 1 diastolic dysfunction mildly enlarged RV size, mildly dilated left atrium size, small pericardial effusion, mild MR, mild to moderate TR, mild aortic stenosis Previous 2D echo in September 2024 showed EF estimated at 40 to 45% History of low EF of 20% in  2019 thought to be due to frequent PVCs but she did not have significant improvement in EF following PVC ablation at that time.   Elevated troponins: Troponin was 251 on admission and dropped to 61.   She has been seen by the cardiologist.  No plan for ischemic workup at this time. She has a history of elevated troponins   Paroxysmal SVT: Continue Toprol-XL and amiodarone. History of PVCs s/p ablation in 2019.   History of pulmonary embolism in 2020: Eliquis has been held for cardiac cath.  Plan to resume Eliquis after cardiac cath.   Stage II decubitus ulcers on bilateral ischial tuberosity (present on admission): Continue local wound care   Comorbidities include COPD, chronic pain syndrome, depression, anxiety, history of giant cell arteritis      Diet Order             Diet NPO time specified Except for: Sips with Meds, Ice Chips, Other (See Comments)  Diet effective midnight                            Consultants: Cardiologist  Procedures: None    Medications:    amiodarone  200 mg Oral BID   [START ON 03/17/2023] apixaban  5 mg Oral BID   ascorbic acid  500 mg Oral Daily   calcium carbonate  2 tablet Oral Q breakfast   cholecalciferol  1,000 Units Oral Daily   cyanocobalamin  1,000 mcg Oral Daily   famotidine  20 mg Oral BID WC   ferrous sulfate  325 mg Oral BID WC   fluticasone  1 spray Each Nare Daily   folic acid  1 mg Oral Daily   furosemide  80 mg Intravenous BID   gabapentin  300 mg Oral TID   hydrocortisone  15 mg Oral Q breakfast   hydrocortisone  5 mg Oral BID AC   lactose free nutrition  237 mL Oral TID BM   loratadine  10 mg Oral QPM   metoprolol succinate  12.5 mg Oral Daily   midodrine  15 mg Oral TID WC   montelukast  10 mg Oral Daily   nortriptyline  50 mg Oral QHS   pantoprazole  40 mg Oral Q breakfast   polyvinyl alcohol  1 drop Both Eyes TID   potassium chloride  40 mEq Oral Once   pyridostigmine  30 mg Oral BID    spironolactone  12.5 mg Oral Daily   thiamine  100 mg Oral Daily   vitamin A  10,000 Units Oral Daily   Continuous Infusions:  sodium chloride 10 mL/hr at 03/16/23 0607     Anti-infectives (From admission, onward)    Start     Dose/Rate Route Frequency Ordered Stop   03/08/23 2100  vancomycin (VANCOREADY) IVPB 1500 mg/300 mL  Status:  Discontinued        1,500 mg 150 mL/hr over 120 Minutes Intravenous Every 24 hours 03/08/23 0111 03/08/23 0711   03/08/23 2100  vancomycin (VANCOREADY) IVPB 1750 mg/350 mL  Status:  Discontinued        1,750 mg 175 mL/hr over 120 Minutes Intravenous  Every 24 hours 03/08/23 0711 03/09/23 1122   03/08/23 0400  meropenem (MERREM) 1 g in sodium chloride 0.9 % 100 mL IVPB  Status:  Discontinued        1 g 200 mL/hr over 30 Minutes Intravenous Every 8 hours 03/08/23 0013 03/09/23 1122   03/08/23 0015  vancomycin (VANCOREADY) IVPB 1500 mg/300 mL        1,500 mg 150 mL/hr over 120 Minutes Intravenous  Once 03/08/23 0004 03/08/23 0238   03/07/23 2045  ceFEPIme (MAXIPIME) 2 g in sodium chloride 0.9 % 100 mL IVPB        2 g 200 mL/hr over 30 Minutes Intravenous  Once 03/07/23 2032 03/07/23 2105   03/07/23 2045  vancomycin (VANCOCIN) IVPB 1000 mg/200 mL premix        1,000 mg 200 mL/hr over 60 Minutes Intravenous  Once 03/07/23 2032 03/07/23 2207              Family Communication/Anticipated D/C date and plan/Code Status   DVT prophylaxis:  apixaban (ELIQUIS) tablet 5 mg     Code Status: Full Code  Family Communication: None Disposition Plan: Plan to discharge to SNF   Status is: Inpatient Remains inpatient appropriate because: CHF exacerbation       Subjective:   She just got back from the MRI department.  She said she was feeling cold.  No shortness of breath or chest pain.  Objective:    Vitals:   03/15/23 2341 03/16/23 0438 03/16/23 0517 03/16/23 1031  BP: 116/71 100/64  113/70  Pulse: 88 85  97  Resp: 20 20  16   Temp:  98.6 F (37 C) 98.3 F (36.8 C)  97.9 F (36.6 C)  TempSrc: Oral Oral  Oral  SpO2: 100% 100%  100%  Weight:   116 kg   Height:       No data found.   Intake/Output Summary (Last 24 hours) at 03/16/2023 1039 Last data filed at 03/16/2023 1610 Gross per 24 hour  Intake 601.06 ml  Output 1751 ml  Net -1149.94 ml   Filed Weights   03/13/23 0500 03/14/23 0500 03/16/23 0517  Weight: 119.4 kg 121.2 kg 116 kg    Exam:  GEN: NAD SKIN: Warm and dry EYES: No pallor or icterus ENT: MMM CV: RRR PULM: CTA B ABD: soft, obese, NT, +BS CNS: AAO x 3, non focal EXT: Bilateral lower extremity edema.  No erythema or tenderness    Data Reviewed:   I have personally reviewed following labs and imaging studies:  Labs: Labs show the following:   Basic Metabolic Panel: Recent Labs  Lab 03/10/23 0410 03/13/23 0511 03/15/23 0520 03/16/23 0758 03/16/23 0759  NA 136 136 135 134* 133*  K 3.6 3.5 3.9 3.7 3.6  CL 103 99 99 96* 95*  CO2 26 30 30 31  32  GLUCOSE 102* 83 87 75 79  BUN 26* 19 19 19 18   CREATININE 0.68 0.60 0.58 0.60 0.62  CALCIUM 7.3* 8.1* 8.1* 7.9* 7.9*  MG  --  1.9 1.9  --   --   PHOS  --   --   --   --  3.4   GFR Estimated Creatinine Clearance: 86.2 mL/min (by C-G formula based on SCr of 0.62 mg/dL). Liver Function Tests: Recent Labs  Lab 03/16/23 0759  ALBUMIN 1.5*    No results for input(s): "LIPASE", "AMYLASE" in the last 168 hours. No results for input(s): "AMMONIA" in the last 168 hours. Coagulation  profile No results for input(s): "INR", "PROTIME" in the last 168 hours.   CBC: Recent Labs  Lab 03/10/23 0410 03/15/23 0520  WBC 7.7 8.0  HGB 8.1* 8.6*  HCT 24.2* 26.1*  MCV 83.4 86.1  PLT 574* 615*   Cardiac Enzymes: No results for input(s): "CKTOTAL", "CKMB", "CKMBINDEX", "TROPONINI" in the last 168 hours. BNP (last 3 results) No results for input(s): "PROBNP" in the last 8760 hours. CBG: No results for input(s): "GLUCAP" in the last 168  hours. D-Dimer: No results for input(s): "DDIMER" in the last 72 hours. Hgb A1c: No results for input(s): "HGBA1C" in the last 72 hours. Lipid Profile: No results for input(s): "CHOL", "HDL", "LDLCALC", "TRIG", "CHOLHDL", "LDLDIRECT" in the last 72 hours. Thyroid function studies: No results for input(s): "TSH", "T4TOTAL", "T3FREE", "THYROIDAB" in the last 72 hours.  Invalid input(s): "FREET3" Anemia work up: No results for input(s): "VITAMINB12", "FOLATE", "FERRITIN", "TIBC", "IRON", "RETICCTPCT" in the last 72 hours. Sepsis Labs: Recent Labs  Lab 03/10/23 0410 03/15/23 0520  WBC 7.7 8.0    Microbiology Recent Results (from the past 240 hours)  Resp panel by RT-PCR (RSV, Flu A&B, Covid) Anterior Nasal Swab     Status: None   Collection Time: 03/07/23  8:35 PM   Specimen: Anterior Nasal Swab  Result Value Ref Range Status   SARS Coronavirus 2 by RT PCR NEGATIVE NEGATIVE Final    Comment: (NOTE) SARS-CoV-2 target nucleic acids are NOT DETECTED.  The SARS-CoV-2 RNA is generally detectable in upper respiratory specimens during the acute phase of infection. The lowest concentration of SARS-CoV-2 viral copies this assay can detect is 138 copies/mL. A negative result does not preclude SARS-Cov-2 infection and should not be used as the sole basis for treatment or other patient management decisions. A negative result may occur with  improper specimen collection/handling, submission of specimen other than nasopharyngeal swab, presence of viral mutation(s) within the areas targeted by this assay, and inadequate number of viral copies(<138 copies/mL). A negative result must be combined with clinical observations, patient history, and epidemiological information. The expected result is Negative.  Fact Sheet for Patients:  BloggerCourse.com  Fact Sheet for Healthcare Providers:  SeriousBroker.it  This test is no t yet approved or  cleared by the [place] FDA and  has been authorized for detection and/or diagnosis of SARS-CoV-2 by FDA under an Emergency Use Authorization (EUA). This EUA will remain  in effect (meaning this test can be used) for the duration of the COVID-19 declaration under Section 564(b)(1) of the Act, 21 U.S.C.section 360bbb-3(b)(1), unless the authorization is terminated  or revoked sooner.       Influenza A by PCR NEGATIVE NEGATIVE Final   Influenza B by PCR NEGATIVE NEGATIVE Final    Comment: (NOTE) The Xpert Xpress SARS-CoV-2/FLU/RSV plus assay is intended as an aid in the diagnosis of influenza from Nasopharyngeal swab specimens and should not be used as a sole basis for treatment. Nasal washings and aspirates are unacceptable for Xpert Xpress SARS-CoV-2/FLU/RSV testing.  Fact Sheet for Patients: BloggerCourse.com  Fact Sheet for Healthcare Providers: SeriousBroker.it  This test is not yet approved or cleared by the [place] FDA and has been authorized for detection and/or diagnosis of SARS-CoV-2 by FDA under an Emergency Use Authorization (EUA). This EUA will remain in effect (meaning this test can be used) for the duration of the COVID-19 declaration under Section 564(b)(1) of the Act, 21 U.S.C. section 360bbb-3(b)(1), unless the authorization is terminated or revoked.  Resp Syncytial Virus by PCR NEGATIVE NEGATIVE Final    Comment: (NOTE) Fact Sheet for Patients: BloggerCourse.com  Fact Sheet for Healthcare Providers: SeriousBroker.it  This test is not yet approved or cleared by the [place] FDA and has been authorized for detection and/or diagnosis of SARS-CoV-2 by FDA under an Emergency Use Authorization (EUA). This EUA will remain in effect (meaning this test can be used) for the duration of the COVID-19 declaration under Section 564(b)(1) of the Act, 21  U.S.C. section 360bbb-3(b)(1), unless the authorization is terminated or revoked.  Performed at [facility], [address] [place], [place] [address]   Blood Culture (routine x 2)     Status: None   Collection Time: 03/07/23  8:35 PM   Specimen: BLOOD  Result Value Ref Range Status   Specimen Description BLOOD BLOOD LEFT HAND  Final   Special Requests   Final    BOTTLES DRAWN AEROBIC AND ANAEROBIC Blood Culture results may not be optimal due to an excessive volume of blood received in culture bottles   Culture   Final    NO GROWTH 5 DAYS Performed at [facility], [address] [place], [place] [address]    Report Status 03/12/2023 FINAL  Final  Blood Culture (routine x 2)     Status: None   Collection Time: 03/07/23  8:36 PM   Specimen: BLOOD  Result Value Ref Range Status   Specimen Description BLOOD BLOOD LEFT ARM  Final   Special Requests   Final    BOTTLES DRAWN AEROBIC AND ANAEROBIC Blood Culture results may not be optimal due to an excessive volume of blood received in culture bottles   Culture   Final    NO GROWTH 5 DAYS Performed at [facility], [address], [place], [place] [address]    Report Status 03/12/2023 FINAL  Final  Urine Culture     Status: None   Collection Time: 03/07/23  9:26 PM   Specimen: Urine, Random  Result Value Ref Range Status   Specimen Description   Final    URINE, RANDOM Performed at [facility], [address] [place], [place] [address]    Special Requests   Final    NONE Reflexed from [id number] Performed at [facility], [address] [place], [place] [address]    Culture   Final    NO GROWTH Performed at [facility], [address] [place], [place] [address]    Report Status 03/09/2023 FINAL  Final    Procedures and diagnostic studies:  No results found.              LOS: 9 days   Tehila Sokolow  [employer] Chartered loss adjuster on [email or web address]. If  7PM-7AM, please contact night-coverage at [email or web address]     03/16/2023, 10:39 AM

## 2023-03-16 NOTE — TOC Progression Note (Signed)
 Transition of Care [facility]) - Progression Note    Patient Details  Name: MARBETH SMEDLEY MRN: [id number] Date of Birth: [birth year]  [facility] CM/SW Contact  Margarito Liner, LCSW Phone Number: 03/16/2023, 10:29 AM  Clinical Narrative:   CSW continues to follow progress. CSW asked MD to notify team once close to discharge so SNF insurance authorization can be started.  Expected Discharge Plan: Skilled Nursing Facility Barriers to Discharge: Continued Medical Work up  Expected Discharge Plan and Services     Post Acute Care Choice: Skilled Nursing Facility Living arrangements for the past 2 months: Skilled Nursing Facility                                       Social Determinants of Health (SDOH) Interventions SDOH Screenings   Food Insecurity: No Food Insecurity (03/12/2023)  Housing: Low Risk  (03/12/2023)  Transportation Needs: Unmet Transportation Needs (03/12/2023)  Utilities: Not At Risk (03/08/2023)  Depression (PHQ2-9): Low Risk  (07/01/2022)  Financial Resource Strain: Low Risk  (12/26/2020)   Received from [facility], [facility]  Social Connections: Unknown (03/08/2023)  Tobacco Use: High Risk (03/12/2023)    Readmission Risk Interventions    03/08/2023   11:09 AM 02/03/2021    9:12 AM  Readmission Risk Prevention Plan  Transportation Screening Complete Complete  Medication Review (RN Care Manager) Complete Complete  PCP or Specialist appointment within 3-5 days of discharge Complete Complete  HRI or Home Care Consult  Complete  SW Recovery Care/Counseling Consult Complete   Palliative Care Screening Not Applicable Not Applicable  Skilled Nursing Facility Complete Complete

## 2023-03-17 ENCOUNTER — Encounter: Payer: Self-pay | Admitting: Cardiology

## 2023-03-17 ENCOUNTER — Inpatient Hospital Stay

## 2023-03-17 ENCOUNTER — Inpatient Hospital Stay: Payer: Medicare Other | Admitting: Oncology

## 2023-03-17 ENCOUNTER — Inpatient Hospital Stay: Payer: Medicare Other

## 2023-03-17 DIAGNOSIS — I5043 Acute on chronic combined systolic (congestive) and diastolic (congestive) heart failure: Secondary | ICD-10-CM

## 2023-03-17 DIAGNOSIS — R109 Unspecified abdominal pain: Secondary | ICD-10-CM | POA: Diagnosis present

## 2023-03-17 DIAGNOSIS — I5023 Acute on chronic systolic (congestive) heart failure: Secondary | ICD-10-CM | POA: Diagnosis not present

## 2023-03-17 LAB — GLUCOSE, CAPILLARY
Glucose-Capillary: 105 mg/dL — ABNORMAL HIGH (ref 70–99)
Glucose-Capillary: 108 mg/dL — ABNORMAL HIGH (ref 70–99)

## 2023-03-17 LAB — CBC
HCT: 24.7 % — ABNORMAL LOW (ref 36.0–46.0)
Hemoglobin: 8.3 g/dL — ABNORMAL LOW (ref 12.0–15.0)
MCH: 28 pg (ref 26.0–34.0)
MCHC: 33.6 g/dL (ref 30.0–36.0)
MCV: 83.4 fL (ref 80.0–100.0)
Platelets: 612 10*3/uL — ABNORMAL HIGH (ref 150–400)
RBC: 2.96 MIL/uL — ABNORMAL LOW (ref 3.87–5.11)
RDW: 16 % — ABNORMAL HIGH (ref 11.5–15.5)
WBC: 16.3 10*3/uL — ABNORMAL HIGH (ref 4.0–10.5)
nRBC: 0 % (ref 0.0–0.2)

## 2023-03-17 LAB — RENAL FUNCTION PANEL
Albumin: 1.5 g/dL — ABNORMAL LOW (ref 3.5–5.0)
Anion gap: 7 (ref 5–15)
BUN: 22 mg/dL (ref 8–23)
CO2: 30 mmol/L (ref 22–32)
Calcium: 7.7 mg/dL — ABNORMAL LOW (ref 8.9–10.3)
Chloride: 98 mmol/L (ref 98–111)
Creatinine, Ser: 0.76 mg/dL (ref 0.44–1.00)
GFR, Estimated: >60 mL/min (ref >60)
Glucose, Bld: 93 mg/dL (ref 70–99)
Phosphorus: 4.1 mg/dL (ref 2.5–4.6)
Potassium: 3.5 mmol/L (ref 3.5–5.1)
Sodium: 135 mmol/L (ref 135–145)

## 2023-03-17 LAB — RESPIRATORY PANEL BY PCR

## 2023-03-17 LAB — RESP PANEL BY RT-PCR (RSV, FLU A&B, COVID)  RVPGX2
Influenza A by PCR: NEGATIVE
Influenza B by PCR: NEGATIVE
Resp Syncytial Virus by PCR: POSITIVE — AB
SARS Coronavirus 2 by RT PCR: NEGATIVE

## 2023-03-17 LAB — KAPPA/LAMBDA LIGHT CHAINS
Kappa free light chain: 60 mg/L — ABNORMAL HIGH (ref 3.3–19.4)
Kappa, lambda light chain ratio: 1.17 (ref 0.26–1.65)
Lambda free light chains: 51.4 mg/L — ABNORMAL HIGH (ref 5.7–26.3)

## 2023-03-17 MED ORDER — POTASSIUM CHLORIDE CRYS ER 20 MEQ PO TBCR
40.0000 meq | EXTENDED_RELEASE_TABLET | Freq: Once | ORAL | Status: AC
  Administered 2023-03-17: 40 meq via ORAL
  Filled 2023-03-17: qty 2

## 2023-03-17 MED ORDER — IOHEXOL 300 MG/ML  SOLN
30.0000 mL | Freq: Once | INTRAMUSCULAR | Status: AC | PRN
  Administered 2023-03-17: 30 mL via ORAL

## 2023-03-17 NOTE — Assessment & Plan Note (Addendum)
 RSV infection - Noted. C. difficile antigen and PCR positive, GI pathogen panel for E. Coli. UA with large leukocytes. Urine cultures are multimicrobial. -CT abdomen negative for diverticulitis or colitis. -Chest x-ray normal -ID was also consulted-started her on p.o. vancomycin -Supportive care

## 2023-03-17 NOTE — Plan of Care (Signed)

## 2023-03-17 NOTE — Progress Notes (Signed)
 Progress Note   Patient: Wendy Sanchez VWU:[id number] DOB: 01/13/1951 DOA: 03/07/2023     10 DOS: the patient was seen and examined on 03/17/2023   Brief hospital course: Taken from H&P  Wendy Sanchez is a 73 y.o. female with medical history significant of COPD, CHF with EF of 40-45%, depression, obesity, anemia, DVT and PE on Eliquis, temporal arteritis, renal cell carcinoma, adrenal insufficiency, s/p for colostomy secondary to perforated diverticulitis, who presents with fever, increased urinary frequency.   Patient was found to have hypotension with blood pressure 86/59, which improved to 101/70 after giving 2 L LR bolus in ED, but blood pressure has dropped again to 82/61 with MAP of 69 when I saw pt in ED. Her mental status is normal.  pt was found to have WBC 10.3, GFR> 60, negative PCR for COVID, flu and RSV, UA (hazy appearance, small amount of leukocyte, rare bacteria, WBC 11-20), lactic acid of 1.7 --> 2.2 --> 1.5, troponin 217 --> 251.  Chest x-ray negative for infiltration.  EKG: Sinus rhythm, QTc 484, LAE, LAD, poor R wave progression, low voltage.   2/24: Blood pressure with some improvement to 97/63, patient was started on midodrine and stress doses of steroid.  Elevated leukocytosis today likely secondary to stress doses of steroid.  Preliminary blood cultures negative.  Urine cultures pending.  Troponin peaked at 251 and now trending down.  Family is very concerned about this recurrent UTI, patient has multiple hospitalization for this and has been seen outpatient urology, there were recommending continuing antibiotics at suppressive doses.  2/25: Vital stable.  Blood pressure seems within goal now.  Mild hypokalemia which are being repleted.  Blood cultures remain negative, urine cultures with no growth.  Stopping antibiotics.  Sepsis ruled out.  Increasing the dose of midodrine. Had an episode of sinus tachycardia, restarting home metoprolol and torsemide.  PT is recommending  SNF, not sure if she has any days left  2/26: Vital stable with blood pressure in low 100s.  Echo with EF of 35 to 40%, left ventricular global hypokinesis and severely dilated cavity, grade 1 diastolic dysfunction.  Small pericardial effusion. Patient currently stable to go back to her facility, she needs another insurance authorization which was started today.  She will go back once insurance authorization got approval.  Patient is high risk for readmission and mortality based on underlying comorbidities and poor functional status.  Palliative care was also consulted.  3/5: Patient with new leukocytosis, abdominal pain, watery diarrhea and upper respiratory symptoms.  Respiratory panel was checked and came back positive for RSV.  Patient was also recently started on pyridostigmine which can cause abdominal pain and diarrhea per ID, ordered CT abdomen to rule out any colitis.  Cardiology was also consulted and patient underwent cardiac MRI and right and left cardiac catheterization with following results Cardiac MRI:  1.  Small circumferential pericardial effusion. 2.  Mildly dilated LV with diffuse hypokinesis, EF 30%. 3.  Normal RV size with RV EF 38%. 4. Difficult delayed enhancement images, looked hard to null LV myocardium. Possible small area of mid-wall LGE in the mid inferior wall, nonspecific. 5.  Extracellular volume percentage significantly elevated at 41%.   RHC/LHC: 1. Low filling pressures.  2. Normal cardiac output.  3. 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  4. 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 She was also started on amiodarone for concern of SVT and  PVCs.  Assessment and Plan: Secondary adrenal insufficiency (HCC) Patient received stress doses of Solu-Cortef Continue home Cortef Increasing the dose of midodrine  Abdominal pain And developed  low-grade fever with leukocytosis overnight. Complaining of abdominal pain with watery diarrhea.  Also had new onset upper respiratory symptoms so respiratory panel was checked which came back positive for RSV. -Ordered CT abdomen to rule out any colitis. -Supportive care  Myocardial injury Troponin peaked at 251 and then started trending down.  No chest pain.  Likely demand ischemia with concern of sepsis. -Echocardiogram EF of 35 to 40%, severe LV dilation,mild LVH, normal RV, mild MR, mild aortic stenosis.  Cardiac catheterization with concern of nonischemic cardiomyopathy -Continue aspirin 81 mg daily -Completed 48 hours of heparin infusion.  Chronic obstructive pulmonary disease (COPD) (HCC) No concern of exacerbation. -Continue home bronchodilators  Chronic pain syndrome -As needed Tylenol and oxycodone  HFrEF (heart failure with reduced ejection fraction) (HCC)  2D echo on 10/07/2022 showed EF of 40-45%.  Patient has 1+ leg edema, but no worsening shortness breath, does not seem to have CHF exacerbation.  BNP of 193 Repeat echo with EF of 35 to 40%, severe LV dilation,mild LVH, normal RV, mild MR, mild aortic stenosis.  Patient also underwent right and left cardiac catheterization which shows nonischemic cardiomyopathy. 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 -Cardiology is on board and recommending continue low-dose Toprol and spironolactone -Continue amiodarone  History of pulmonary embolus 2020(PE) Restarting home Eliquis  Depression with anxiety -Continue home meds  Obesity (BMI 30-39.9) Estimated body mass index is 39.34 kg/m as calculated from the following:   Height as of this encounter: 5\' 8"  (1.727 m).   Weight as of this encounter: 117.3 kg.   -This will complicate overall prognosis -Encouraged weight loss  Pressure injury of skin Pressure Injury 03/08/23 Ischial tuberosity Left Stage 2 -  Partial thickness  loss of dermis presenting as a shallow open injury with a red, pink wound bed without slough. (Active)  03/08/23 1100  Location: Ischial tuberosity  Location Orientation: Left  Staging: Stage 2 -  Partial thickness loss of dermis presenting as a shallow open injury with a red, pink wound bed without slough.  Wound Description (Comments):   Present on Admission: Yes     Pressure Injury 03/08/23 Ischial tuberosity Right Stage 2 -  Partial thickness loss of dermis presenting as a shallow open injury with a red, pink wound bed without slough. (Active)  03/08/23 1100  Location: Ischial tuberosity  Location Orientation: Right  Staging: Stage 2 -  Partial thickness loss of dermis presenting as a shallow open injury with a red, pink wound bed without slough.  Wound Description (Comments):   Present on Admission: Yes   Present on admission     Subjective: Patient was complaining of worsening abdominal pain, having watery discharge in colostomy bag.  Also complaining of some sore throat and nasal congestion.  Appetite poor today.  Physical Exam: Vitals:   03/17/23 0500 03/17/23 0758 03/17/23 0759 03/17/23 1133  BP:  (!) 83/52 (!) 83/52 110/63  Pulse:  (!) 101 (!) 101 (!) 101  Resp:  19  18  Temp:  98.7 F (37.1 C)  98.9 F (37.2 C)  TempSrc:      SpO2:  95%  99%  Weight: 116.9 kg     Height:       General.  Frail elderly lady, in no acute distress. Pulmonary.  Lungs  clear bilaterally, normal respiratory effort. CV.  Regular rate and rhythm, no JVD, rub or murmur. Abdomen.  Soft, mild diffuse tenderness, nondistended, BS positive.  Colostomy with watery stool in place. CNS.  Alert and oriented .  No focal neurologic deficit. Extremities.  Bilateral lower extremities wrapped   Data Reviewed: Prior data reviewed  Family Communication: Talked with son on phone.  Disposition: Status is: Inpatient Remains inpatient appropriate because: Severity of illness  Planned Discharge  Destination: Skilled nursing facility  Time spent: 50 minutes  This record has been created using Conservation officer, historic buildings. Errors have been sought and corrected,but may not always be located. Such creation errors do not reflect on the standard of care.   Author: Arnetha Courser, MD 03/17/2023 3:06 PM  For on call review [email or web address].

## 2023-03-17 NOTE — Plan of Care (Signed)
 Alert and oriented. Moved out of semi-private room due to isolation precaution orders.   Problem: Education: Goal: Knowledge of General Education information will improve Description: Including pain rating scale, medication(s)/side effects and non-pharmacologic comfort measures Outcome: Progressing   Problem: Health Behavior/Discharge Planning: Goal: Ability to manage health-related needs will improve Outcome: Progressing   Problem: Clinical Measurements: Goal: Ability to maintain clinical measurements within normal limits will improve Outcome: Progressing Goal: Will remain free from infection Outcome: Progressing Goal: Diagnostic test results will improve Outcome: Progressing

## 2023-03-17 NOTE — TOC Progression Note (Signed)
 Transition of Care [facility]) - Progression Note    Patient Details  Name: Wendy Sanchez MRN: [id number] Date of Birth: September 01, 1950  Transition of Care [facility]) CM/SW Contact  Truddie Hidden, RN Phone Number: 03/17/2023, 1:31 PM  Clinical Narrative:    TOC continuing to follow patient's progress throughout discharge planning.   Expected Discharge Plan: Skilled Nursing Facility Barriers to Discharge: Continued Medical Work up  Expected Discharge Plan and Services     Post Acute Care Choice: Skilled Nursing Facility Living arrangements for the past 2 months: Skilled Nursing Facility                                       Social Determinants of Health (SDOH) Interventions SDOH Screenings   Food Insecurity: No Food Insecurity (03/12/2023)  Housing: Low Risk  (03/12/2023)  Transportation Needs: Unmet Transportation Needs (03/12/2023)  Utilities: Not At Risk (03/08/2023)  Depression (PHQ2-9): Low Risk  (07/01/2022)  Financial Resource Strain: Low Risk  (12/26/2020)   Received from [facility], [facility]  Social Connections: Unknown (03/08/2023)  Tobacco Use: High Risk (03/12/2023)    Readmission Risk Interventions    03/08/2023   11:09 AM 02/03/2021    9:12 AM  Readmission Risk Prevention Plan  Transportation Screening Complete Complete  Medication Review (RN Care Manager) Complete Complete  PCP or Specialist appointment within 3-5 days of discharge Complete Complete  HRI or Home Care Consult  Complete  SW Recovery Care/Counseling Consult Complete   Palliative Care Screening Not Applicable Not Applicable  Skilled Nursing Facility Complete Complete

## 2023-03-17 NOTE — Progress Notes (Signed)
 Patient ID: Wendy Sanchez, female   DOB: 1950-11-09, 73 y.o.   MRN: [id number]     Advanced Heart Failure Rounding Note  Cardiologist: Julien Nordmann, MD  Chief Complaint: CHF Subjective:    Patient feels congested this morning, SBP 90s.  Occasional cough.  WBCs 21 => 16, she had a low grade fever to 100.7 today and is afebrile this morning.  She complains of some abdominal discomfort, no nausea/vomiting.   Cardiac MRI:  1.  Small circumferential pericardial effusion. 2.  Mildly dilated LV with diffuse hypokinesis, EF 30%. 3.  Normal RV size with RV EF 38%. 4. Difficult delayed enhancement images, looked hard to null LV myocardium. Possible small area of mid-wall LGE in the mid inferior wall, nonspecific. 5.  Extracellular volume percentage significantly elevated at 41%.  RHC/LHC: 1. Low filling pressures.  2. Normal cardiac output.  3. 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  4. Mild aortic stenosis, peak-to-peak gradient 18 mmHg  Objective:   Weight Range: 116.9 kg Body mass index is 39.19 kg/m.   Vital Signs:   Temp:  [97.5 F (36.4 C)-100.7 F (38.2 C)] 98.7 F (37.1 C) (03/05 0758) Pulse Rate:  [96-120] 101 (03/05 0758) Resp:  [16-26] 19 (03/05 0758) BP: (77-137)/(42-117) 83/52 (03/05 0758) SpO2:  [62 %-100 %] 95 % (03/05 0758) Weight:  [116.9 kg] 116.9 kg (03/05 0500) Last BM Date : 03/16/23  Weight change: Filed Weights   03/14/23 0500 03/16/23 0517 03/17/23 0500  Weight: 121.2 kg 116 kg 116.9 kg    Intake/Output:   Intake/Output Summary (Last 24 hours) at 03/17/2023 0802 Last data filed at 03/17/2023 0716 Gross per 24 hour  Intake --  Output 1800 ml  Net -1800 ml      Physical Exam    General: NAD Neck: No JVD, no thyromegaly or thyroid nodule.  Lungs: Clear to auscultation bilaterally with normal respiratory effort. CV: Nondisplaced PMI.  Heart regular S1/S2, no S3/S4, 1/6 SEM RUSB.  Legs wrapped.  Abdomen:  Soft, mild epigastric tenderness without guarding/rebound, no hepatosplenomegaly, no distention.  Skin: Intact without lesions or rashes.  Neurologic: Alert and oriented x 3.  Psych: Normal affect. Extremities: No clubbing or cyanosis.  HEENT: Normal.   Telemetry   NSR with occasional PVCs (personally reviewed)  Labs    CBC Recent Labs    03/16/23 1840 03/17/23 0445  WBC 21.2* 16.3*  HGB 9.5* 8.3*  HCT 28.4* 24.7*  MCV 85.3 83.4  PLT 677* 612*   Basic Metabolic Panel Recent Labs    40/10/27 0520 03/16/23 0758 03/16/23 0759 03/16/23 1525 03/16/23 1527 03/16/23 1840 03/17/23 0445  NA 135   < > 133*   < > 134*  --  135  K 3.9   < > 3.6   < > 3.2*  --  3.5  CL 99   < > 95*  --   --   --  98  CO2 30   < > 32  --   --   --  30  GLUCOSE 87   < > 79  --   --   --  93  BUN 19   < > 18  --   --   --  22  CREATININE 0.58   < > 0.62  --   --  0.62 0.76  CALCIUM 8.1*   < > 7.9*  --   --   --  7.7*  MG 1.9  --   --   --   --   --   --  PHOS  --   --  3.4  --   --   --  4.1   < > = values in this interval not displayed.   Liver Function Tests Recent Labs    03/16/23 0759 03/17/23 0445  ALBUMIN 1.5* 1.5*   No results for input(s): "LIPASE", "AMYLASE" in the last 72 hours. Cardiac Enzymes No results for input(s): "CKTOTAL", "CKMB", "CKMBINDEX", "TROPONINI" in the last 72 hours.  BNP: BNP (last 3 results) Recent Labs    02/04/23 0523 03/07/23 2035  BNP 315.9* 193.3*    ProBNP (last 3 results) No results for input(s): "PROBNP" in the last 8760 hours.   D-Dimer No results for input(s): "DDIMER" in the last 72 hours. Hemoglobin A1C No results for input(s): "HGBA1C" in the last 72 hours. Fasting Lipid Panel No results for input(s): "CHOL", "HDL", "LDLCALC", "TRIG", "CHOLHDL", "LDLDIRECT" in the last 72 hours. Thyroid Function Tests No results for input(s): "TSH", "T4TOTAL", "T3FREE", "THYROIDAB" in the last 72 hours.  Invalid input(s): "FREET3"  Other  results:   Imaging    CARDIAC CATHETERIZATION Result Date: 03/16/2023 1. Low filling pressures. 2. Normal cardiac output. 3. 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 4. Mild aortic stenosis, peak-to-peak gradient 18 mmHg   MR CARDIAC VELOCITY FLOW MAP Result Date: 03/16/2023 CLINICAL DATA:  Assess for cardiac amyloidosis EXAM: CARDIAC MRI TECHNIQUE: The patient was scanned on a 1.5 Tesla GE magnet. A dedicated cardiac coil was used. Functional imaging was done using Fiesta sequences. 2,3, and 4 chamber views were done to assess for RWMA's. Modified Simpson's rule using a short axis stack was used to calculate an ejection fraction on a dedicated work [employer]. The patient received 8 cc of Gadavist. After 10 minutes inversion recovery sequences were used to assess for infiltration and scar tissue. FINDINGS: Limited images of the lung fields showed no gross abnormalities. There is a small circumferential pericardial effusion. Mildly dilated left ventricle with normal wall thickness. Diffuse hypokinesis, LV EF 30%. Normal RV size with RV EF 38%. Mild right atrial enlargement, normal left atrium. Trileaflet aortic valve, thickened and mildly restricted. No significant mitral regurgitation noted visually, unable to calculate regurgitant fraction. Delayed enhancement images appeared somewhat difficult to null. There was a possible small area of mid-wall late gadolinium enhancement (LGE) in the mid inferior wall. MEASUREMENTS: MEASUREMENTS LVEDV 291 mL LVEDVi 123 mL/m2 LVSV 87 mL LVEF 30% RVEDV 188 mL RVEDVi 80 mL/m2 RVSV 71 mL RVEF 38% Unable to calculated aortic forward volume via flow sequences, flow sequences limited by artifact. T1 1226, ECV 41% IMPRESSION: 1.  Small circumferential pericardial effusion. 2.  Mildly dilated LV with diffuse hypokinesis, EF 30%. 3.  Normal RV size with RV EF 38%. 4. Difficult delayed enhancement images, looked hard to null  LV myocardium. Possible small area of mid-wall LGE in the mid inferior wall, nonspecific. 5.  Extracellular volume percentage significantly elevated at 41%. 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 However, the LV wall was not significantly thickened. Karon Heckendorn Electronically Signed   By: Marca Ancona M.D.   On: 03/16/2023 13:08   MR CARDIAC VELOCITY FLOW MAP Result Date: 03/16/2023 CLINICAL DATA:  Assess for cardiac amyloidosis EXAM: CARDIAC MRI TECHNIQUE: The patient was scanned on a 1.5 Tesla GE magnet. A dedicated cardiac coil was used. Functional imaging was done using Fiesta sequences. 2,3, and 4 chamber views were done to assess  for RWMA's. Modified Simpson's rule using a short axis stack was used to calculate an ejection fraction on a dedicated work [employer], [employer]. The patient received 8 cc of Gadavist. After 10 minutes inversion recovery sequences were used to assess for infiltration and scar tissue. FINDINGS: Limited images of the lung fields showed no gross abnormalities. There is a small circumferential pericardial effusion. Mildly dilated left ventricle with normal wall thickness. Diffuse hypokinesis, LV EF 30%. Normal RV size with RV EF 38%. Mild right atrial enlargement, normal left atrium. Trileaflet aortic valve, thickened and mildly restricted. No significant mitral regurgitation noted visually, unable to calculate regurgitant fraction. Delayed enhancement images appeared somewhat difficult to null. There was a possible small area of mid-wall late gadolinium enhancement (LGE) in the mid inferior wall. MEASUREMENTS: MEASUREMENTS LVEDV 291 mL LVEDVi 123 mL/m2 LVSV 87 mL LVEF 30% RVEDV 188 mL RVEDVi 80 mL/m2 RVSV 71 mL RVEF 38% Unable to calculated aortic forward volume via flow sequences, flow sequences limited by artifact. T1 1226, ECV 41% IMPRESSION: 1.  Small circumferential  pericardial effusion. 2.  Mildly dilated LV with diffuse hypokinesis, EF 30%. 3.  Normal RV size with RV EF 38%. 4. Difficult delayed enhancement images, looked hard to null LV myocardium. Possible small area of mid-wall LGE in the mid inferior wall, nonspecific. 5.  Extracellular volume percentage significantly elevated at 41%. 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 However, the LV wall was not significantly thickened. Dnasia Gauna Electronically Signed   By: Marca Ancona M.D.   On: 03/16/2023 13:08   MR CARDIAC MORPHOLOGY W WO CONTRAST Result Date: 03/16/2023 CLINICAL DATA:  Assess for cardiac amyloidosis EXAM: CARDIAC MRI TECHNIQUE: The patient was scanned on a 1.5 Tesla GE magnet. A dedicated cardiac coil was used. Functional imaging was done using Fiesta sequences. 2,3, and 4 chamber views were done to assess for RWMA's. Modified Simpson's rule using a short axis stack was used to calculate an ejection fraction on a dedicated work [employer] officer, [employer]. The patient received 8 cc of Gadavist. After 10 minutes inversion recovery sequences were used to assess for infiltration and scar tissue. FINDINGS: Limited images of the lung fields showed no gross abnormalities. There is a small circumferential pericardial effusion. Mildly dilated left ventricle with normal wall thickness. Diffuse hypokinesis, LV EF 30%. Normal RV size with RV EF 38%. Mild right atrial enlargement, normal left atrium. Trileaflet aortic valve, thickened and mildly restricted. No significant mitral regurgitation noted visually, unable to calculate regurgitant fraction. Delayed enhancement images appeared somewhat difficult to null. There was a possible small area of mid-wall late gadolinium enhancement (LGE) in the mid inferior wall. MEASUREMENTS: MEASUREMENTS LVEDV 291 mL LVEDVi 123 mL/m2 LVSV 87 mL LVEF 30% RVEDV 188 mL RVEDVi 80  mL/m2 RVSV 71 mL RVEF 38% Unable to calculated aortic forward volume via flow sequences, flow sequences limited by artifact. T1 1226, ECV 41% IMPRESSION: 1.  Small circumferential pericardial effusion. 2.  Mildly dilated LV with diffuse hypokinesis, EF 30%. 3.  Normal RV size with RV EF 38%. 4. Difficult delayed enhancement images, looked hard to null LV myocardium. Possible small area of mid-wall LGE in the mid inferior wall, nonspecific. 5.  Extracellular volume percentage significantly elevated at 41%. 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 However, the LV wall was not significantly thickened.  Auriella Wieand Electronically Signed   By: Marca Ancona M.D.   On: 03/16/2023 13:08     Medications:     Scheduled Medications:  amiodarone  200 mg Oral BID   apixaban  5 mg Oral BID   ascorbic acid  500 mg Oral Daily   calcium carbonate  2 tablet Oral Q breakfast   cholecalciferol  1,000 Units Oral Daily   cyanocobalamin  1,000 mcg Oral Daily   famotidine  20 mg Oral BID WC   ferrous sulfate  325 mg Oral BID WC   fluticasone  1 spray Each Nare Daily   folic acid  1 mg Oral Daily   gabapentin  300 mg Oral TID   hydrocortisone  15 mg Oral Q breakfast   hydrocortisone  5 mg Oral BID AC   lactose free nutrition  237 mL Oral TID BM   loratadine  10 mg Oral QPM   metoprolol succinate  12.5 mg Oral Daily   midodrine  15 mg Oral TID WC   montelukast  10 mg Oral Daily   nortriptyline  50 mg Oral QHS   pantoprazole  40 mg Oral Q breakfast   polyvinyl alcohol  1 drop Both Eyes TID   potassium chloride  40 mEq Oral Once   pyridostigmine  30 mg Oral BID   sodium chloride flush  3 mL Intravenous Q12H   spironolactone  12.5 mg Oral Daily   thiamine  100 mg Oral Daily   vitamin A  10,000 Units Oral Daily    Infusions:  sodium chloride      PRN Medications: sodium chloride, oxyCODONE **AND**  acetaminophen, acetaminophen, acetaminophen, albuterol, dextromethorphan-guaiFENesin, diphenhydrAMINE, docusate sodium, melatonin, ondansetron (ZOFRAN) IV, ondansetron (ZOFRAN) IV, sodium chloride flush    Assessment/Plan   1. Acute on chronic systolic CHF: Echo this admission with EF 35-40%, severe LV dilation, mild LVH, normal RV, mild MR, mild aortic stenosis.  EF was 25-30% by echo in 2018 but improved back to normal range.  Now it has fallen again. GDMT limited by orthostatic hypotension.  She has had prominent peripheral edema, not as prominent JVD. She was diuresed here and RHC 3/4 showed low filling pressures, preserved cardiac output.  Coronary angiography showed no CAD, nonischemic cardiomyopathy.  Cardiac MRI showed LV EF 30%, RV EF 38%, myocardium difficult to null with ECV 41%.  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 However, the LV wall was not significantly thickened. She does not look volume overloaded on exam today.  No room for titration of GDMT with ongoing soft BP.  - Hold diuretics today, reassess tomorrow.  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  Ideally would get a PYP scan, but this may have to be done as an outpatient.  Faint IgA band on SPEP, pending UPEP.  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  - Continue Unna boots.  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  - She can continue Toprol XL 12.5 mg daily.  - Continue spironolactone 12.5 daily.  - Not a candidate for SGLT2 inhibitors due to frequent UTIs.  2. SVT: She is now on amiodarone with no further SVT runs.  - Continue amiodarone 200 mg bid today.  3. PVCs: H/o PVC ablation in 2019.  Still with fairly frequent PVCs on telemetry.  -  Now on  amiodarone.  4. Giant cell arteritis: Prednisone in past, now on hydrocortisone.  5. H/o diverticulitis with sigmoid colectomy and colostomy.  6. 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  - Continue Eliquis.  7. Elevated troponin: Suspect demand ischemia with volume overload.  Minimal CAD on coronary angiography.  8. ID: WBCs 21 => 16. She had low grade fever to 100.7 yesterday, afebrile today.  Complains of congestion and mild abdominal discomfort with mild tenderness. Treated for ?UTI earlier in stay with IV abx.  - I will get CXR PA/lateral. - Blood cultures.     Length of Stay: 10  Marca Ancona, MD  03/17/2023, 8:02 AM  Advanced Heart Failure Team Pager [phone] (M-F; 7a - 5p)  Please contact [facility] for night-coverage after hours (5p -7a ) and weekends on [email or web address]

## 2023-03-17 NOTE — Progress Notes (Signed)
 Patient in 9/10 abdominal pain.  BP currently 83/52 MAP 63  Requesting pain medication.  Discussed with Dr. Shirlee Latch on rounds.  He stated to only give partial dose of 5mg  oxycodone at this time due to current blood pressure.     03/17/23 0758 03/17/23 0814  Vitals  Temp 98.7 F (37.1 C)  --   BP (!) 83/52  --   MAP (mmHg) (!) 63  --   BP Location Left Arm  --   BP Method Automatic  --   Patient Position (if appropriate) Lying  --   Pulse Rate (!) 101  --   Pulse Rate Source Monitor  --   Resp 19  --   MEWS COLOR  MEWS Score Color Yellow  --   Oxygen Therapy  SpO2 95 %  --   O2 Device Room Air  --   Pain Assessment  Pain Scale  --  0-10  Pain Score  --  9  Pain Location  --  Abdomen  Pain Orientation  --  Mid  Pain Descriptors / Indicators  --  Discomfort;Constant  Pain Frequency  --  Constant  Pain Intervention(s)  --  Medication (See eMAR)  MEWS Score  MEWS Temp 0  --   MEWS Systolic 1  --   MEWS Pulse 1  --   MEWS RR 0  --   MEWS LOC 0  --   MEWS Score 2  --   Provider Notification  Provider Name/Title  --  Dr. McLean/Cardiologist  Date Provider Notified  --  03/17/23  Time Provider Notified  --  [phone]  Method of Notification  --  Face-to-face  Notification Reason  --  Change in status (BP low, requesting pain medication)  Provider response  --  At bedside;Other (Comment) (Stated to give only 5mg  oxycodone with low BP currently.)  Date of Provider Response  --  03/17/23  Time of Provider Response  --  [phone]

## 2023-03-17 NOTE — Progress Notes (Signed)
 Occupational Therapy Treatment Patient Details Name: Wendy Sanchez MRN: [id number] DOB: September 05, 1950 Today's Date: 03/17/2023   History of present illness Pt is a 73 y.o. female admitted with sepsis, adrenal insufficiency, NSTEMI and recurrent UTI. PMH significant for COPD, T2 compression fx, OA, CHF with EF of 40-45%, depression, obesity, anemia, DVT and PE on Eliquis, temporal arteritis, renal cell carcinoma, adrenal insufficiency, s/p for colostomy secondary to perforated diverticulitis.   OT comments  Chart reviewed to date, pt greeted in room with son present, agreeable to OT tx session targeting improving functional activity tolerance in preparation for ADL tasks. MAX A +2 required for bed mobility, pt reports fatigue, unable to tolerate standing on this date. Pt sits on edge of bed for approx 10 minutes with Fair-good sitting balance. Educated pt/son re: positioning for improved lung function. Pt is left in care of nurse/NT as ostomy bag noted to be leaking by patient after therapists left the room. OT will continue to follow acutely.   Vitals are as follows:  Semi supine: 93/55 (MAP 66)  HR 109 bpm  Spo2 96% on RA   Sitting upright 107/62 (MAP 74)  HR 105 bpm  Spo2 96% on RA   BP taken on edge of bed, WNL;       If plan is discharge home, recommend the following:  Two people to help with walking and/or transfers;Two people to help with bathing/dressing/bathroom;Assistance with cooking/housework   Equipment Recommendations  Other (comment) (defer)    Recommendations for Other Services      Precautions / Restrictions Precautions Precautions: Fall;Other (comment) Recall of Precautions/Restrictions: Intact Precaution/Restrictions Comments: monitor HR, BP; R heart cath 03/16/23 Restrictions Weight Bearing Restrictions Per Provider Order: Yes RUE Weight Bearing Per Provider Order:  (treated as NWBing)       Mobility Bed Mobility Overal bed mobility: Needs Assistance Bed  Mobility: Supine to Sit, Sit to Supine, Rolling Rolling: Max assist, +2 for physical assistance, +2 for safety/equipment   Supine to sit: Max assist, +2 for physical assistance, +2 for safety/equipment Sit to supine: Max assist, HOB elevated, +2 for physical assistance, +2 for safety/equipment        Transfers                   General transfer comment: NT on this date     Balance Overall balance assessment: Needs assistance Sitting-balance support: Feet supported Sitting balance-Leahy Scale: Fair   Postural control: Posterior lean, Right lateral lean                                 ADL either performed or assessed with clinical judgement   ADL Overall ADL's : Needs assistance/impaired Eating/Feeding: Set up;Sitting   Grooming: Set up;Sitting       Lower Body Bathing: Maximal assistance;Bed level       Lower Body Dressing: Total assistance;Bed level Lower Body Dressing Details (indicate cue type and reason): to doff/don socks               General ADL Comments: limited by fatigue on this date    Extremity/Trunk Assessment              Vision       Perception     Praxis     Communication Communication Communication: No apparent difficulties   Cognition Arousal: Alert Behavior During Therapy: WFL for tasks assessed/performed Cognition: No apparent impairments  Following commands: Intact        Cueing   Cueing Techniques: Verbal cues  Exercises Other Exercises Other Exercises: edu pt/son re: role of OT, role of rehab, importance of positioning for improved lung function    Shoulder Instructions       General Comments spo2 >90% on RA throughout, HR up to 119 bpm max during mobility; ostomy noted to be leaking post session, nurse notified    Pertinent Vitals/ Pain       Pain Assessment Pain Assessment: 0-10 Pain Score: 7  Pain Location: headache Pain Descriptors /  Indicators: Headache Pain Intervention(s): Limited activity within patient's tolerance, Monitored during session, Repositioned  Home Living                                          Prior Functioning/Environment              Frequency  Min 1X/week        Progress Toward Goals  OT Goals(current goals can now be found in the care plan section)  Progress towards OT goals: Progressing toward goals  Acute Rehab OT Goals Time For Goal Achievement: 03/23/23  Plan      Co-evaluation    PT/OT/SLP Co-Evaluation/Treatment: Yes Reason for Co-Treatment: Complexity of the patient's impairments (multi-system involvement);For patient/therapist safety;To address functional/ADL transfers   OT goals addressed during session: ADL's and self-care      AM-PAC OT "6 Clicks" Daily Activity     Outcome Measure   Help from another person eating meals?: None Help from another person taking care of personal grooming?: A Little Help from another person toileting, which includes using toliet, bedpan, or urinal?: Total Help from another person bathing (including washing, rinsing, drying)?: Total Help from another person to put on and taking off regular upper body clothing?: A Little Help from another person to put on and taking off regular lower body clothing?: Total 6 Click Score: 13    End of Session    OT Visit Diagnosis: Muscle weakness (generalized) (M62.81);Other abnormalities of gait and mobility (R26.89);Unsteadiness on feet (R26.81)   Activity Tolerance Patient limited by fatigue   Patient Left with call bell/phone within reach;in bed;with bed alarm set;with nursing/sitter in room;with family/visitor present   Nurse Communication Mobility status        Time: 1610-9604 OT Time Calculation (min): 27 min  Charges: OT General Charges $OT Visit: 1 Visit OT Treatments $Therapeutic Activity: 8-22 mins  Oleta Mouse, OTD OTR/L  03/17/23, 2:49 PM

## 2023-03-17 NOTE — Assessment & Plan Note (Signed)
 2D echo on 10/07/2022 showed EF of 40-45%.  Patient has 1+ leg edema, but no worsening shortness breath, does not seem to have CHF exacerbation.  BNP of 193 Repeat echo with EF of 35 to 40%, severe LV dilation,mild LVH, normal RV, mild MR, mild aortic stenosis.  Patient also underwent right and left cardiac catheterization which shows nonischemic cardiomyopathy. 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 -Cardiology is on board and recommending continue low-dose Toprol and spironolactone -Continue amiodarone

## 2023-03-17 NOTE — Progress Notes (Signed)
 Physical Therapy Treatment Patient Details Name: Wendy Sanchez MRN: [id number] DOB: 08/22/1950 Today's Date: 03/17/2023   History of Present Illness Pt is a 73 y.o. female admitted with sepsis, adrenal insufficiency, NSTEMI and recurrent UTI. PMH significant for COPD, T2 compression fx, OA, CHF with EF of 40-45%, depression, obesity, anemia, DVT and PE on Eliquis, temporal arteritis, renal cell carcinoma, adrenal insufficiency, s/p for colostomy secondary to perforated diverticulitis.    PT Comments  Pt alert, agreeable to mobility but reported 7/10 headache, RN notified. BP, HR, and spO2 monitored throughout. BP in supine 93/55 and in sitting EOB 107/62. MaxAx2 for supine <> sit. Able to sit for several minutes and sara stedy present to attempt standing but pt declined this session due to progressing fatigue. Returned to supine with needs in reach, and placed in chair position. The patient would benefit from further skilled PT intervention to continue to progress towards goals.     If plan is discharge home, recommend the following: Two people to help with walking and/or transfers;A lot of help with bathing/dressing/bathroom;Help with stairs or ramp for entrance;Assist for transportation   Can travel by private vehicle     No  Equipment Recommendations   (TBD)    Recommendations for Other Services       Precautions / Restrictions Precautions Precautions: Fall;Other (comment) Recall of Precautions/Restrictions: Intact Precaution/Restrictions Comments: monitor HR, BP; R heart cath 03/16/23 Restrictions Weight Bearing Restrictions Per Provider Order: Yes Other Position/Activity Restrictions: RUE NWB per cardiology "broken wing" protocol s/p intervention     Mobility  Bed Mobility Overal bed mobility: Needs Assistance Bed Mobility: Supine to Sit, Sit to Supine, Rolling Rolling: Max assist, +2 for physical assistance, +2 for safety/equipment   Supine to sit: Max assist, +2 for  physical assistance, +2 for safety/equipment Sit to supine: Max assist, HOB elevated, +2 for physical assistance, +2 for safety/equipment        Transfers                   General transfer comment: NT on this date    Ambulation/Gait                   Stairs             Wheelchair Mobility     Tilt Bed    Modified Rankin (Stroke Patients Only)       Balance   Sitting-balance support: Feet supported Sitting balance-Leahy Scale: Fair Sitting balance - Comments: with fatigue pt needed support                                    Communication Communication Communication: No apparent difficulties  Cognition Arousal: Alert Behavior During Therapy: WFL for tasks assessed/performed   PT - Cognitive impairments: No apparent impairments                                Cueing    Exercises Other Exercises Other Exercises: ankle pumps, heel slides AAROM, glute squeezes x10    General Comments General comments (skin integrity, edema, etc.): spo2 >90% on RA throughout, HR up to 119 bpm max during mobility; ostomy noted to be leaking post session, nurse notified      Pertinent Vitals/Pain Pain Assessment Pain Assessment: 0-10 Pain Score: 7  Pain Location: head Pain Descriptors / Indicators: Aching  Home Living                          Prior Function            PT Goals (current goals can now be found in the care plan section) Progress towards PT goals: Progressing toward goals    Frequency    Min 1X/week      PT Plan      Co-evaluation PT/OT/SLP Co-Evaluation/Treatment: Yes Reason for Co-Treatment: Complexity of the patient's impairments (multi-system involvement);For patient/therapist safety;To address functional/ADL transfers PT goals addressed during session: Mobility/safety with mobility OT goals addressed during session: ADL's and self-care      AM-PAC PT "6 Clicks" Mobility    Outcome Measure  Help needed turning from your back to your side while in a flat bed without using bedrails?: A Lot Help needed moving from lying on your back to sitting on the side of a flat bed without using bedrails?: A Lot Help needed moving to and from a bed to a chair (including a wheelchair)?: Total Help needed standing up from a chair using your arms (e.g., wheelchair or bedside chair)?: Total Help needed to walk in hospital room?: Total Help needed climbing 3-5 steps with a railing? : Total 6 Click Score: 8    End of Session Equipment Utilized During Treatment: Oxygen Activity Tolerance: Patient limited by fatigue Patient left: in bed;with call bell/phone within reach;with bed alarm set (in chair position) Nurse Communication: Mobility status PT Visit Diagnosis: Muscle weakness (generalized) (M62.81);Unsteadiness on feet (R26.81);Difficulty in walking, not elsewhere classified (R26.2)     Time: 1610-9604 PT Time Calculation (min) (ACUTE ONLY): 28 min  Charges:    $Therapeutic Activity: 8-22 mins PT General Charges $$ ACUTE PT VISIT: 1 Visit                     Olga Coaster PT, DPT 3:26 PM,03/17/23

## 2023-03-17 NOTE — Progress Notes (Signed)
 OT Cancellation Note  Patient Details Name: Wendy Sanchez MRN: [id number] DOB: 13-Nov-1950   Cancelled Treatment:    Reason Eval/Treat Not Completed: Other (comment) (attempted to see patient for OT, pt OTF at this time. OT will re attempt as able.Oleta Mouse, OTD OTR/L  03/17/23, 8:49 AM

## 2023-03-17 NOTE — Progress Notes (Signed)
 PT Cancellation Note  Patient Details Name: SYANN CUPPLES MRN: [id number] DOB: Oct 01, 1950   Cancelled Treatment:    Reason Eval/Treat Not Completed: Other (comment). Pt out of room at this time, PT to re-attempt as able.    Olga Coaster PT, DPT 8:49 AM,03/17/23

## 2023-03-18 ENCOUNTER — Telehealth: Payer: Self-pay | Admitting: Cardiovascular Disease

## 2023-03-18 DIAGNOSIS — J069 Acute upper respiratory infection, unspecified: Secondary | ICD-10-CM | POA: Diagnosis not present

## 2023-03-18 DIAGNOSIS — R109 Unspecified abdominal pain: Secondary | ICD-10-CM

## 2023-03-18 DIAGNOSIS — K5730 Diverticulosis of large intestine without perforation or abscess without bleeding: Secondary | ICD-10-CM

## 2023-03-18 DIAGNOSIS — R509 Fever, unspecified: Secondary | ICD-10-CM | POA: Diagnosis not present

## 2023-03-18 DIAGNOSIS — I5043 Acute on chronic combined systolic (congestive) and diastolic (congestive) heart failure: Secondary | ICD-10-CM | POA: Diagnosis not present

## 2023-03-18 DIAGNOSIS — E2740 Unspecified adrenocortical insufficiency: Secondary | ICD-10-CM

## 2023-03-18 DIAGNOSIS — D72829 Elevated white blood cell count, unspecified: Secondary | ICD-10-CM

## 2023-03-18 DIAGNOSIS — B974 Respiratory syncytial virus as the cause of diseases classified elsewhere: Secondary | ICD-10-CM

## 2023-03-18 DIAGNOSIS — I5023 Acute on chronic systolic (congestive) heart failure: Secondary | ICD-10-CM | POA: Diagnosis not present

## 2023-03-18 DIAGNOSIS — Z87440 Personal history of urinary (tract) infections: Secondary | ICD-10-CM

## 2023-03-18 LAB — URINALYSIS, COMPLETE (UACMP) WITH MICROSCOPIC
Bilirubin Urine: NEGATIVE
Glucose, UA: NEGATIVE mg/dL
Hgb urine dipstick: NEGATIVE
Ketones, ur: NEGATIVE mg/dL
Nitrite: NEGATIVE
Protein, ur: NEGATIVE mg/dL
Specific Gravity, Urine: 1.017 (ref 1.005–1.030)
WBC, UA: >50 WBC/hpf (ref 0–5)
pH: 7 (ref 5.0–8.0)

## 2023-03-18 LAB — CBC
HCT: 25.6 % — ABNORMAL LOW (ref 36.0–46.0)
Hemoglobin: 8.5 g/dL — ABNORMAL LOW (ref 12.0–15.0)
MCH: 27.6 pg (ref 26.0–34.0)
MCHC: 33.2 g/dL (ref 30.0–36.0)
MCV: 83.1 fL (ref 80.0–100.0)
Platelets: 576 10*3/uL — ABNORMAL HIGH (ref 150–400)
RBC: 3.08 MIL/uL — ABNORMAL LOW (ref 3.87–5.11)
RDW: 16 % — ABNORMAL HIGH (ref 11.5–15.5)
WBC: 13.4 10*3/uL — ABNORMAL HIGH (ref 4.0–10.5)
nRBC: 0 % (ref 0.0–0.2)

## 2023-03-18 LAB — GASTROINTESTINAL PANEL BY PCR, STOOL (REPLACES STOOL CULTURE)

## 2023-03-18 LAB — RENAL FUNCTION PANEL
Albumin: <1.5 g/dL — ABNORMAL LOW (ref 3.5–5.0)
Anion gap: 8 (ref 5–15)
BUN: 18 mg/dL (ref 8–23)
CO2: 28 mmol/L (ref 22–32)
Calcium: 7.8 mg/dL — ABNORMAL LOW (ref 8.9–10.3)
Chloride: 99 mmol/L (ref 98–111)
Creatinine, Ser: 0.69 mg/dL (ref 0.44–1.00)
GFR, Estimated: >60 mL/min (ref >60)
Glucose, Bld: 77 mg/dL (ref 70–99)
Phosphorus: 3.7 mg/dL (ref 2.5–4.6)
Potassium: 3.8 mmol/L (ref 3.5–5.1)
Sodium: 135 mmol/L (ref 135–145)

## 2023-03-18 LAB — UPEP/UIFE/LIGHT CHAINS/TP, 24-HR UR
% BETA, Urine: 37.3 %
ALPHA 1 URINE: 4.6 %
Albumin, U: 11.6 %
Alpha 2, Urine: 17.1 %
Free Kappa Lt Chains,Ur: 166.73 mg/L — ABNORMAL HIGH (ref 1.17–86.46)
Free Kappa/Lambda Ratio: 4.83 (ref 1.83–14.26)
Free Lambda Lt Chains,Ur: 34.5 mg/L — ABNORMAL HIGH (ref 0.27–15.21)
GAMMA GLOBULIN URINE: 29.4 %
Total Protein, Urine-Ur/day: 157 mg/(24.h) — ABNORMAL HIGH (ref 30–150)
Total Protein, Urine: 9.5 mg/dL
Total Volume: 1650

## 2023-03-18 LAB — C DIFFICILE (CDIFF) QUICK SCRN (NO PCR REFLEX)
C Diff antigen: POSITIVE — AB
C Diff toxin: NEGATIVE

## 2023-03-18 LAB — LIPOPROTEIN A (LPA): Lipoprotein (a): 159.7 nmol/L — ABNORMAL HIGH (ref <75.0)

## 2023-03-18 LAB — CLOSTRIDIUM DIFFICILE BY PCR, REFLEXED: Toxigenic C. Difficile by PCR: POSITIVE — AB

## 2023-03-18 MED ORDER — POTASSIUM CHLORIDE 20 MEQ PO PACK
20.0000 meq | PACK | Freq: Once | ORAL | Status: AC
  Administered 2023-03-18: 20 meq via ORAL
  Filled 2023-03-18: qty 1

## 2023-03-18 MED ORDER — SCOPOLAMINE 1 MG/3DAYS TD PT72
1.0000 | MEDICATED_PATCH | TRANSDERMAL | Status: DC
  Administered 2023-03-18 – 2023-03-21 (×2): 1.5 mg via TRANSDERMAL
  Filled 2023-03-18 (×2): qty 1

## 2023-03-18 MED ORDER — AMIODARONE HCL 200 MG PO TABS
200.0000 mg | ORAL_TABLET | Freq: Every day | ORAL | Status: DC
  Administered 2023-03-19 – 2023-03-27 (×9): 200 mg via ORAL
  Filled 2023-03-18 (×9): qty 1

## 2023-03-18 MED ORDER — TORSEMIDE 20 MG PO TABS
20.0000 mg | ORAL_TABLET | Freq: Every day | ORAL | Status: DC
  Administered 2023-03-19 – 2023-03-27 (×9): 20 mg via ORAL
  Filled 2023-03-18 (×9): qty 1

## 2023-03-18 MED ORDER — ZINC OXIDE 40 % EX OINT
TOPICAL_OINTMENT | Freq: Three times a day (TID) | CUTANEOUS | Status: AC | PRN
  Filled 2023-03-18: qty 113

## 2023-03-18 MED ORDER — HALOPERIDOL LACTATE 5 MG/ML IJ SOLN: 2.0000 mg | Freq: Once | INTRAMUSCULAR | Status: DC

## 2023-03-18 MED ORDER — PHENOL 1.4 % MT LIQD
1.0000 | OROMUCOSAL | Status: DC | PRN
  Administered 2023-03-18 – 2023-03-20 (×4): 1 via OROMUCOSAL
  Filled 2023-03-18: qty 177

## 2023-03-18 NOTE — Plan of Care (Signed)
  Problem: Education: Goal: Knowledge of General Education information will improve Description: Including pain rating scale, medication(s)/side effects and non-pharmacologic comfort measures Outcome: Progressing   Problem: Health Behavior/Discharge Planning: Goal: Ability to manage health-related needs will improve Outcome: Progressing   Problem: Nutrition: Goal: Adequate nutrition will be maintained Outcome: Progressing   Problem: Elimination: Goal: Will not experience complications related to urinary retention Outcome: Progressing   

## 2023-03-18 NOTE — Consult Note (Addendum)
 [facility]    Date of Admission:  03/07/2023   Total days of inpatient antibiotics 2        Reason for Consult: Fever    Principal Problem:   Hypotension Active Problems:   Chronic pain syndrome   Chronic obstructive pulmonary disease (COPD) (HCC)   HFrEF (heart failure with reduced ejection fraction) (HCC)   History of pulmonary embolus 2020(PE)   Secondary adrenal insufficiency (HCC)   Obesity (BMI 30-39.9)   Myocardial injury   Depression with anxiety   Pressure injury of skin   Sepsis (HCC)   Nonischemic cardiomyopathy (HCC)   Acute on chronic HFrEF (heart failure with reduced ejection fraction) (HCC)   SVT (supraventricular tachycardia) (HCC)   Abdominal pain   Assessment: 73 year old female with history of recurrent UTI, COPD, CHF, temporal arteritis on, renal cell carcinoma, status post colostomy secondary perforated diverticulitis initially admitted for sepsis 2/2 UTI, now managed for adrenal insufficiency p,.She developed fevers and abdominal pain ID engaged. #Fever with leukocytosis and abdominal pain #RSV #History of recurrent UTI,  #Diverticulitis status post colostomy for perforated diverticulitis #Hypotension in the setting of adrenal insufficiency on midodrine and pyridostigmine - CT abdomen pelvis did not show any acute abnormalities - I spoke to patient's daughter and she states that her abdominal pain is consistent with prior UTIs.She did have similar symptoms on admission with negative urine cultures. . Suspect her GI symptoms with abdominal cramping and diarrhea may be secondary to pyrostigminne which was started few days ago. - Another contributor could be RSV positive infection which could have GI symptoms.  She is having increased watery output from colostomy, 550 mL in the last 24 hours.  Recommendations:  -Hold abx -Will get c. diff and gip - UA -Follow blood Cx   Evaluation of this patient requires complex  antimicrobial therapy evaluation and counseling + isolation needs for disease transmission risk assessment and mitigation   Microbiology:   Antibiotics: Cefepime 2/23 Merrem 2/23-24 Vnaomcyin 2/23-24  Cultures: Blood 2/23 ng 2/23 urine Cx ng Urine  Other   HPI: Wendy Sanchez is a 73 y.o. female history of recurrent UTI, COPD, CHF with reduced ejection fraction, depression, obesity, DVT/PE on Eliquis, temporal arteritis, renal cell carcinoma, adrenal insufficiency status post colostomy secondary to perforated particular Whitis presented with fever and increased urinary frequency.  She was started on broad-spectrum antibiotics with Merrem and vancomycin x 2 days.  UA was negative.  Antibiotics stopped.  Manage medically for drop in blood pressure secondary adrenal sufficiency for start Solu-Cortef now on pyridostigmine.  ID engaged as on 3/4 she developed fever, worsening leukocytosis.  RSV positive although chest x-ray looking benign.  CT abdomen pelvis did not show colitis.   Review of Systems: Review of Systems  All other systems reviewed and are negative.   Past Medical History:  Diagnosis Date   Allergic rhinitis    Arthritis    Asthma    problems with fumes and aerosols cause asthma   Chest pain 10/19/2016   CHF (congestive heart failure) (HCC)    Complication of anesthesia    awakens during surgery; has occurred with last 3-4 surgeries    COPD (chronic obstructive pulmonary disease) (HCC)    DVT (deep venous thrombosis) (HCC) 07/13/2018   Right leg   Environmental allergies    fumes    Fibromyalgia    GERD (gastroesophageal reflux disease)    Headache    History  of bronchitis    Hx of total knee arthroplasty 12/13/2015   Hyperlipidemia    Hypertension    Morbid obesity with BMI of 45.0-49.9, adult (HCC) 07/25/2015   NICM (nonischemic cardiomyopathy) (HCC)    a. ? PVC mediated;  b. 06/2016 Echo: EF 25-30%, mild LVH, mild LAE, mild AI/MR/TR/PR; c. 08/2016 Cath: nl  cors, EF 25%.   Numbness    hands bilat when driving; improves when not preforming task    Osteoarthritis of left knee 08/02/2015   PE (pulmonary thromboembolism) (HCC) 07/13/2018   Pes anserinus bursitis of left knee 05/18/2016   Presence of right artificial knee joint 05/18/2016   PVC (premature ventricular contraction) 06/04/2017   PVC's (premature ventricular contractions)    a. 11/2016 Amio started;  b. 11/29/16 48h Holter: 57574 PVC's (41%); c. 12/2016 24h Holter: 18040 PVC's (15%); c. 02/2017 48h Holter: 37262 PVC's (41%).   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   Temporal arteritis (HCC) 05/01/2020   Tinnitus    comes and goes    Unilateral primary osteoarthritis, right knee 12/13/2015   Vertigo    none for over 2 yrs    Social History   Tobacco Use   Smoking status: Former    Current packs/day: 0.00    Average packs/day: 0.3 packs/day for 50.0 years (12.5 ttl pk-yrs)    Types: Cigarettes    Start date: 08/07/1970    Quit date: 08/06/2020    Years since quitting: 2.6   Smokeless tobacco: Current    Types: Chew  Vaping Use   Vaping status: Never Used  Substance Use Topics   Alcohol use: No    Alcohol/week: 0.0 standard drinks of alcohol   Drug use: No    Family History  Problem Relation Age of Onset   Hypertension Father    Deep vein thrombosis Father    Dementia Mother    Diabetes Sister    Congestive Heart Failure Sister    Congestive Heart Failure Brother    Congestive Heart Failure Daughter    Congestive Heart Failure Son    Breast cancer Maternal Aunt        great aunt   Scheduled Meds:  amiodarone  200 mg Oral BID   apixaban  5 mg Oral BID   ascorbic acid  500 mg Oral Daily   calcium carbonate  2 tablet Oral Q breakfast   cholecalciferol  1,000 Units Oral Daily   cyanocobalamin  1,000 mcg Oral Daily   famotidine  20 mg  Oral BID WC   ferrous sulfate  325 mg Oral BID WC   fluticasone  1 spray Each Nare Daily   folic acid  1 mg Oral Daily   gabapentin  300 mg Oral TID   hydrocortisone  15 mg Oral Q breakfast   hydrocortisone  5 mg Oral BID AC   lactose free nutrition  237 mL Oral TID BM   loratadine  10 mg Oral QPM   metoprolol succinate  12.5 mg Oral Daily   midodrine  15 mg Oral TID WC   montelukast  10 mg Oral Daily   nortriptyline  50 mg Oral QHS   pantoprazole  40 mg Oral Q breakfast   polyvinyl alcohol  1 drop Both Eyes TID   pyridostigmine  30 mg Oral BID   scopolamine  1  patch Transdermal Q72H   sodium chloride flush  3 mL Intravenous Q12H   spironolactone  12.5 mg Oral Daily   thiamine  100 mg Oral Daily   vitamin A  10,000 Units Oral Daily   Continuous Infusions: PRN Meds:.oxyCODONE **AND** acetaminophen, acetaminophen, acetaminophen, albuterol, dextromethorphan-guaiFENesin, diphenhydrAMINE, docusate sodium, melatonin, ondansetron (ZOFRAN) IV, ondansetron (ZOFRAN) IV, phenol, sodium chloride flush Allergies  Allergen Reactions   Hydrocodone-Acetaminophen Swelling    hands   Iodine Anaphylaxis and Swelling    Iv dye 02/03/21-Ispoke to patient and she had IV contrast  X4 last year and has had no problem. ( Last CT abdomen with contrast was on 01/02/21.-    Other Other (See Comments)    ALLERGY TO METAL - BLACKENS SKIN AND CAUSES A RASH    Tape Swelling and Other (See Comments)    Skin comes off.  Paper tape is ok tegaderm OK   Peanut Oil Other (See Comments)    ** Nuts cause runny nose   Shellfish Allergy Nausea And Vomiting and Swelling    Episode of GI infection after eating clam chowder. Still eats shrimp and other seafood    OBJECTIVE: Blood pressure 96/62, pulse 94, temperature 99 F (37.2 C), temperature source Oral, resp. rate 18, height 5\' 8"  (1.727 m), weight 117.8 kg, SpO2 100%.  Physical Exam HENT:     Head: Normocephalic and atraumatic.     Right Ear: Tympanic  membrane normal.     Left Ear: Tympanic membrane normal.     Nose: Nose normal.     Mouth/Throat:     Mouth: Mucous membranes are moist.  Eyes:     Extraocular Movements: Extraocular movements intact.     Conjunctiva/sclera: Conjunctivae normal.     Pupils: Pupils are equal, round, and reactive to light.  Cardiovascular:     Rate and Rhythm: Normal rate and regular rhythm.     Heart sounds: No murmur heard.    No friction rub. No gallop.  Pulmonary:     Effort: Pulmonary effort is normal.     Breath sounds: Normal breath sounds.  Abdominal:     General: Abdomen is flat.     Comments: Ostomy  Musculoskeletal:        General: Normal range of motion.  Skin:    General: Skin is warm and dry.  Neurological:     General: No focal deficit present.     Mental Status: She is oriented to person, place, and time.  Psychiatric:        Mood and Affect: Mood normal.     Lab Results Lab Results  Component Value Date   WBC 13.4 (H) 03/18/2023   HGB 8.5 (L) 03/18/2023   HCT 25.6 (L) 03/18/2023   MCV 83.1 03/18/2023   PLT 576 (H) 03/18/2023    Lab Results  Component Value Date   CREATININE 0.69 03/18/2023   BUN 18 03/18/2023   NA 135 03/18/2023   K 3.8 03/18/2023   CL 99 03/18/2023   CO2 28 03/18/2023    Lab Results  Component Value Date   ALT 15 03/07/2023   AST 23 03/07/2023   ALKPHOS 168 (H) 03/07/2023   BILITOT 0.7 03/07/2023       Danelle Earthly, MD [facility] 03/18/2023, 12:30 PM

## 2023-03-18 NOTE — Progress Notes (Signed)
 Patient ID: Wendy Sanchez, female   DOB: 1950-01-30, 73 y.o.   MRN: [id number]     Advanced Heart Failure Rounding Note  Cardiologist: Julien Nordmann, MD  Chief Complaint: CHF Subjective:    WBCs lower today at 13.6.  She had CXR showing no PNA and CT abdomen/pelvis with no acute findings.  She is currently afebrile, blood cultures NGTD.  She had some abdominal discomfort and diarrhea but now says she is feeling better.   Cardiac MRI:  1.  Small circumferential pericardial effusion. 2.  Mildly dilated LV with diffuse hypokinesis, EF 30%. 3.  Normal RV size with RV EF 38%. 4. Difficult delayed enhancement images, looked hard to null LV myocardium. Possible small area of mid-wall LGE in the mid inferior wall, nonspecific. 5.  Extracellular volume percentage significantly elevated at 41%.  RHC/LHC: 1. Low filling pressures.  2. Normal cardiac output.  3. 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  4. Mild aortic stenosis, peak-to-peak gradient 18 mmHg  Objective:   Weight Range: 117.8 kg Body mass index is 39.49 kg/m.   Vital Signs:   Temp:  [98.9 F (37.2 C)-101.4 F (38.6 C)] 99 F (37.2 C) (03/06 1155) Pulse Rate:  [94-103] 94 (03/06 1155) Resp:  [18-19] 18 (03/06 0407) BP: (95-105)/(56-71) 96/62 (03/06 1155) SpO2:  [96 %-100 %] 100 % (03/06 1155) Weight:  [117.8 kg] 117.8 kg (03/06 0407) Last BM Date : 03/17/23  Weight change: Filed Weights   03/16/23 0517 03/17/23 0500 03/18/23 0407  Weight: 116 kg 116.9 kg 117.8 kg    Intake/Output:   Intake/Output Summary (Last 24 hours) at 03/18/2023 1521 Last data filed at 03/18/2023 1415 Gross per 24 hour  Intake 243 ml  Output 1050 ml  Net -807 ml      Physical Exam    General: NAD Neck: JVP 8 cm, no thyromegaly or thyroid nodule.  Lungs: Clear to auscultation bilaterally with normal respiratory effort. CV: Nondisplaced PMI.  Heart regular S1/S2, no S3/S4, no murmur.  1+ ankle  edema.   Abdomen: Soft, nontender, no hepatosplenomegaly, no distention. Colostomy.  Skin: Intact without lesions or rashes.  Neurologic: Alert and oriented x 3.  Psych: Normal affect. Extremities: No clubbing or cyanosis.  HEENT: Normal.    Telemetry   NSR with occasional PVCs (personally reviewed)  Labs    CBC Recent Labs    03/17/23 0445 03/18/23 0600  WBC 16.3* 13.4*  HGB 8.3* 8.5*  HCT 24.7* 25.6*  MCV 83.4 83.1  PLT 612* 576*   Basic Metabolic Panel Recent Labs    40/98/11 0445 03/18/23 0600  NA 135 135  K 3.5 3.8  CL 98 99  CO2 30 28  GLUCOSE 93 77  BUN 22 18  CREATININE 0.76 0.69  CALCIUM 7.7* 7.8*  PHOS 4.1 3.7   Liver Function Tests Recent Labs    03/17/23 0445 03/18/23 0600  ALBUMIN 1.5* <1.5*   No results for input(s): "LIPASE", "AMYLASE" in the last 72 hours. Cardiac Enzymes No results for input(s): "CKTOTAL", "CKMB", "CKMBINDEX", "TROPONINI" in the last 72 hours.  BNP: BNP (last 3 results) Recent Labs    02/04/23 0523 03/07/23 2035  BNP 315.9* 193.3*    ProBNP (last 3 results) No results for input(s): "PROBNP" in the last 8760 hours.   D-Dimer No results for input(s): "DDIMER" in the last 72 hours. Hemoglobin A1C No results for input(s): "HGBA1C" in the last 72 hours. Fasting Lipid Panel No results for input(s): "CHOL", "HDL", "LDLCALC", "  TRIG", "CHOLHDL", "LDLDIRECT" in the last 72 hours. Thyroid Function Tests No results for input(s): "TSH", "T4TOTAL", "T3FREE", "THYROIDAB" in the last 72 hours.  Invalid input(s): "FREET3"  Other results:   Imaging    CT ABDOMEN PELVIS WO CONTRAST Result Date: 03/17/2023 CLINICAL DATA:  Acute abdominal pain EXAM: CT ABDOMEN AND PELVIS WITHOUT CONTRAST TECHNIQUE: Multidetector CT imaging of the abdomen and pelvis was performed following the standard protocol without IV contrast. RADIATION DOSE REDUCTION: This exam was performed according to the departmental dose-optimization program  which includes automated exposure control, adjustment of the mA and/or kV according to patient size and/or use of iterative reconstruction technique. COMPARISON:  02/04/2021 FINDINGS: Lower chest: Mild left basilar atelectasis is noted. Small pericardial effusion is seen. Hepatobiliary: Fatty infiltration of the liver is noted. The gallbladder has been surgically removed. Pancreas: Unremarkable. No pancreatic ductal dilatation or surrounding inflammatory changes. Spleen: Normal in size without focal abnormality. Adrenals/Urinary Tract: Adrenal glands are within normal limits. Kidneys show normal appearance. A few scattered hyperdense cysts are noted within the right kidney stable from the prior exam. No renal calculi or obstructive changes are seen. The bladder is decompressed. Stomach/Bowel: Hartmann's pouch is noted. Left-sided colostomy is identified. Herniated small bowel loops are noted adjacent to the colonic ostomy loop. No obstructive changes are seen. Scattered diverticular change of the colon is noted. Colon is predominately decompressed. No obstructive or inflammatory changes are seen. The appendix is not well visualized. No inflammatory changes to suggest appendicitis are noted. Small bowel and stomach appear within normal limits. Vascular/Lymphatic: Aortic atherosclerosis. No enlarged abdominal or pelvic lymph nodes. Reproductive: Status post hysterectomy. No adnexal masses. Other: No abdominal wall hernia or abnormality. No abdominopelvic ascites. Musculoskeletal: Degenerative changes of lumbar spine are noted. IMPRESSION: Postoperative changes consistent with the clinical history and left-sided ostomy. Diverticulosis without diverticulitis. Chronic changes as described above. Electronically Signed   By: Alcide Clever M.D.   On: 03/17/2023 22:41     Medications:     Scheduled Medications:  amiodarone  200 mg Oral BID   apixaban  5 mg Oral BID   ascorbic acid  500 mg Oral Daily   calcium  carbonate  2 tablet Oral Q breakfast   cholecalciferol  1,000 Units Oral Daily   cyanocobalamin  1,000 mcg Oral Daily   famotidine  20 mg Oral BID WC   ferrous sulfate  325 mg Oral BID WC   fluticasone  1 spray Each Nare Daily   folic acid  1 mg Oral Daily   gabapentin  300 mg Oral TID   hydrocortisone  15 mg Oral Q breakfast   hydrocortisone  5 mg Oral BID AC   lactose free nutrition  237 mL Oral TID BM   loratadine  10 mg Oral QPM   metoprolol succinate  12.5 mg Oral Daily   midodrine  15 mg Oral TID WC   montelukast  10 mg Oral Daily   nortriptyline  50 mg Oral QHS   pantoprazole  40 mg Oral Q breakfast   polyvinyl alcohol  1 drop Both Eyes TID   pyridostigmine  30 mg Oral BID   scopolamine  1 patch Transdermal Q72H   sodium chloride flush  3 mL Intravenous Q12H   spironolactone  12.5 mg Oral Daily   thiamine  100 mg Oral Daily   [START ON 03/19/2023] torsemide  20 mg Oral Daily   vitamin A  10,000 Units Oral Daily    Infusions:  PRN Medications: oxyCODONE **AND** acetaminophen, acetaminophen, acetaminophen, albuterol, dextromethorphan-guaiFENesin, diphenhydrAMINE, docusate sodium, liver oil-zinc oxide, melatonin, ondansetron (ZOFRAN) IV, ondansetron (ZOFRAN) IV, phenol, sodium chloride flush    Assessment/Plan   1. Acute on chronic systolic CHF: Echo this admission with EF 35-40%, severe LV dilation, mild LVH, normal RV, mild MR, mild aortic stenosis.  EF was 25-30% by echo in 2018 but improved back to normal range.  Now it has fallen again. GDMT limited by orthostatic hypotension.  She has had prominent peripheral edema, not as prominent JVD. She was diuresed here and RHC 3/4 showed low filling pressures, preserved cardiac output.  Coronary angiography showed no CAD, nonischemic cardiomyopathy.  Cardiac MRI showed LV EF 30%, RV EF 38%, myocardium difficult to null with ECV 41%.  Some features of this study were consistent with cardiac amyloidosis: Small pericardial  effusion, ECV > 40%, LV myocardium difficult to null. However, the LV wall was not significantly thickened. She looks near-euvolemic today.  No room for titration of GDMT with ongoing soft BP.  - Hold diuretics today, start back on home torsemide 20 mg daily tomorrow.  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  Ideally would get a PYP scan, but this may have to be done as an outpatient.  Faint IgA band on SPEP, pending UPEP.  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  - Continue Unna boots.  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 If abdominal symptoms continue, may have to stop pyridostigmine.  - She can continue Toprol XL 12.5 mg daily.  - Continue spironolactone 12.5 daily.  - Not a candidate for SGLT2 inhibitors due to frequent UTIs.  2. SVT: She is now on amiodarone with no further SVT runs.  - Decrease amiodarone to 200 mg once daily.  3. PVCs: H/o PVC ablation in 2019.  Still with fairly frequent PVCs on telemetry.  - Now on amiodarone.  4. Giant cell arteritis: Prednisone in past, now on hydrocortisone.  5. H/o diverticulitis with sigmoid colectomy and colostomy.  6. H/o DVT/PE:  - Continue Eliquis.  7. Elevated troponin: Suspect demand ischemia with volume overload.  Minimal CAD on coronary angiography.  8. ID: WBCs 21 => 16 => 13. She had low grade fever prior, afebrile today.  Treated for ?UTI earlier in stay with IV abx.  She has had abdominal pain and diarrhea.  CXR with no pneumonia, CT abd/pelvis with no acute findings.  Blood cultures NGTD.  - Currently denies abdominal pain/diarrhea.  If resumes/continues, may need to stop pyridostigmine.  - ID is following.  9. 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  She is now on midodrine and pyridostigmine, legs are wrapped.  May need abdominal binder.      Length of Stay: 52  Marca Ancona, MD  03/18/2023, 3:21 PM  Advanced Heart Failure Team Pager [phone] (M-F; 7a - 5p)  Please contact [facility] for night-coverage after hours (5p -7a ) and weekends on [email or web address]

## 2023-03-18 NOTE — Plan of Care (Signed)
  Problem: Education: Goal: Knowledge of General Education information will improve Description: Including pain rating scale, medication(s)/side effects and non-pharmacologic comfort measures Outcome: Progressing   Problem: Clinical Measurements: Goal: Ability to maintain clinical measurements within normal limits will improve Outcome: Progressing   Problem: Activity: Goal: Risk for activity intolerance will decrease Outcome: Progressing   Problem: Nutrition: Goal: Adequate nutrition will be maintained Outcome: Progressing   Problem: Coping: Goal: Level of anxiety will decrease Outcome: Progressing   Problem: Safety: Goal: Ability to remain free from injury will improve Outcome: Progressing   Problem: Skin Integrity: Goal: Risk for impaired skin integrity will decrease Outcome: Progressing   Problem: Education: Goal: Understanding of CV disease, CV risk reduction, and recovery process will improve Outcome: Progressing   Problem: Activity: Goal: Ability to return to baseline activity level will improve Outcome: Progressing   Problem: Cardiovascular: Goal: Ability to achieve and maintain adequate cardiovascular perfusion will improve Outcome: Progressing   Problem: Health Behavior/Discharge Planning: Goal: Ability to safely manage health-related needs after discharge will improve Outcome: Progressing

## 2023-03-18 NOTE — Telephone Encounter (Signed)
Patient stated she is returning staff call.

## 2023-03-18 NOTE — Progress Notes (Signed)
 Mobility Specialist - Progress Note   03/18/23 1159  Mobility  Activity Dangled on edge of bed  Level of Assistance Maximum assist, patient does 25-49%  Activity Response Tolerated well  Mobility visit 1 Mobility     Pre-mobility: 100 HR, 100% SpO2 During mobility: 110 HR, 109/68 BP, 83% SpO2 Post-mobility: 109 HR, 96% SpO2   Pt lying in bed upon arrival, utilizing RA. Pt agreeable to activity. Completed bed mobility with maxA +2 via log roll. Voiced chest tightness upon sitting. Dangled Eob ~ 5 minutes while checking BP. VC for PLB as O2 desat to 83%. Coughing up phlegm. Intermittent minA to maintain balance. Pt returned semi-supine with alarm set, needs in reach. Chest pain did resolve some post-session. Sister at bedside. RN notified.    Filiberto Pinks Mobility Specialist 03/18/23, 12:10 PM

## 2023-03-18 NOTE — Progress Notes (Signed)
 Progress Note   Patient: Wendy Sanchez DOB: [birth year] DOA: 03/07/2023     11 DOS: the patient was seen and examined on 03/18/2023   Brief hospital course: Taken from H&P  Wendy Sanchez is a 73 y.o. female with medical history significant of COPD, CHF with EF of 40-45%, depression, obesity, anemia, DVT and PE on Eliquis, temporal arteritis, renal cell carcinoma, adrenal insufficiency, s/p for colostomy secondary to perforated diverticulitis, who presents with fever, increased urinary frequency.   Patient was found to have hypotension with blood pressure 86/59, which improved to 101/70 after giving 2 L LR bolus in ED, but blood pressure has dropped again to 82/61 with MAP of 69 when I saw pt in ED. Her mental status is normal.  pt was found to have WBC 10.3, GFR> 60, negative PCR for COVID, flu and RSV, UA (hazy appearance, small amount of leukocyte, rare bacteria, WBC 11-20), lactic acid of 1.7 --> 2.2 --> 1.5, troponin 217 --> 251.  Chest x-ray negative for infiltration.  EKG: Sinus rhythm, QTc 484, LAE, LAD, poor R wave progression, low voltage.   2/24: Blood pressure with some improvement to 97/63, patient was started on midodrine and stress doses of steroid.  Elevated leukocytosis today likely secondary to stress doses of steroid.  Preliminary blood cultures negative.  Urine cultures pending.  Troponin peaked at 251 and now trending down.  Family is very concerned about this recurrent UTI, patient has multiple hospitalization for this and has been seen outpatient urology, there were recommending continuing antibiotics at suppressive doses.  2/25: Vital stable.  Blood pressure seems within goal now.  Mild hypokalemia which are being repleted.  Blood cultures remain negative, urine cultures with no growth.  Stopping antibiotics.  Sepsis ruled out.  Increasing the dose of midodrine. Had an episode of sinus tachycardia, restarting home metoprolol and torsemide.  PT is recommending  SNF, not sure if she has any days left  2/26: Vital stable with blood pressure in low 100s.  Echo with EF of 35 to 40%, left ventricular global hypokinesis and severely dilated cavity, grade 1 diastolic dysfunction.  Small pericardial effusion. Patient currently stable to go back to her facility, she needs another insurance authorization which was started today.  She will go back once insurance authorization got approval.  Patient is high risk for readmission and mortality based on underlying comorbidities and poor functional status.  Palliative care was also consulted.  3/5: Patient with new leukocytosis, abdominal pain, watery diarrhea and upper respiratory symptoms.  Respiratory panel was checked and came back positive for RSV.  Patient was also recently started on pyridostigmine which can cause abdominal pain and diarrhea per ID, ordered CT abdomen to rule out any colitis.  Cardiology was also consulted and patient underwent cardiac MRI and right and left cardiac catheterization with following results Cardiac MRI:  1.  Small circumferential pericardial effusion. 2.  Mildly dilated LV with diffuse hypokinesis, EF 30%. 3.  Normal RV size with RV EF 38%. 4. Difficult delayed enhancement images, looked hard to null LV myocardium. Possible small area of mid-wall LGE in the mid inferior wall, nonspecific. 5.  Extracellular volume percentage significantly elevated at 41%.   RHC/LHC: 1. Low filling pressures.  2. Normal cardiac output.  3. 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  4. 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 She was also started on amiodarone for concern of SVT and  PVCs.  3/6: Febrile at 101.4 over the past 24-hour.  Patient with more upper respiratory symptoms.  Continue to have mild abdominal discomfort but mostly pointing towards epigastrium, loose stools in  colostomy bag.  Improving leukocytosis, C. difficile came back positive for antigen and negative for toxin-pending PCR.  GI pathogen panel and UA was also ordered and pending.  CT abdomen was negative for any concern of colitis or diverticulitis.  Chest x-ray normal.  Likely due to RSV   Assessment and Plan: Secondary adrenal insufficiency [facility] Patient received stress doses of Solu-Cortef Continue home Cortef Increasing the dose of midodrine  Abdominal pain RSV infection And developed  fever with leukocytosis overnight, leukocytosis started improving. Complaining of abdominal pain with diarrhea.  Also had new onset upper respiratory symptoms so respiratory panel was checked which came back positive for RSV. -CT abdomen negative for diverticulitis or colitis. -Chest x-ray normal -C. difficile antigen positive with negative toxin, PCR pending -GI pathogen panel and UA pending -ID was also consulted -Supportive care  Myocardial injury Troponin peaked at 251 and then started trending down.  No chest pain.  Likely demand ischemia with concern of sepsis. -Echocardiogram EF of 35 to 40%, severe LV dilation,mild LVH, normal RV, mild MR, mild aortic stenosis.  Cardiac catheterization with concern of nonischemic cardiomyopathy -Continue aspirin 81 mg daily -Completed 48 hours of heparin infusion.  Chronic obstructive pulmonary disease (COPD) (HCC) No concern of exacerbation. -Continue home bronchodilators  Chronic pain syndrome -As needed Tylenol and oxycodone  HFrEF (heart failure with reduced ejection fraction) (HCC)  2D echo on 10/07/2022 showed EF of 40-45%.  Patient has 1+ leg edema, but no worsening shortness breath, does not seem to have CHF exacerbation.  BNP of 193 Repeat echo with EF of 35 to 40%, severe LV dilation,mild LVH, normal RV, mild MR, mild aortic stenosis.  Patient also underwent right and left cardiac catheterization which shows nonischemic cardiomyopathy. Cardiac  MRI concern of cardiac amyloidosis which need for further workup as outpatient. -Cardiology is on board and recommending continue low-dose Toprol and spironolactone -Continue amiodarone  History of pulmonary embolus 2020(PE) Restarting home Eliquis  Depression with anxiety -Continue home meds  Obesity (BMI 30-39.9) Estimated body mass index is 39.34 kg/m as calculated from the following:   Height as of this encounter: 5\' 8"  (1.727 m).   Weight as of this encounter: 117.3 kg.   -This will complicate overall prognosis -Encouraged weight loss  Pressure injury of skin Pressure Injury 03/08/23 Ischial tuberosity Left Stage 2 -  Partial thickness loss of dermis presenting as a shallow open injury with a red, pink wound bed without slough. (Active)  03/08/23 1100  Location: Ischial tuberosity  Location Orientation: Left  Staging: Stage 2 -  Partial thickness loss of dermis presenting as a shallow open injury with a red, pink wound bed without slough.  Wound Description (Comments):   Present on Admission: Yes     Pressure Injury 03/08/23 Ischial tuberosity Right Stage 2 -  Partial thickness loss of dermis presenting as a shallow open injury with a red, pink wound bed without slough. (Active)  03/08/23 1100  Location: Ischial tuberosity  Location Orientation: Right  Staging: Stage 2 -  Partial thickness loss of dermis presenting as a shallow open injury with a red, pink wound bed without slough.  Wound Description (Comments):   Present on Admission: Yes   Present on admission     Subjective: Patient was complaining of worsening cough and congestion.  Abdominal pain seems improving but still having some epigastric discomfort.  Diarrhea seems improving per nursing staff.  Physical Exam: Vitals:   03/18/23 0807 03/18/23 0836 03/18/23 1155 03/18/23 1544  BP: 101/71 101/71 96/62 (!) 101/58  Pulse: 100 100 94 86  Resp:      Temp: 99.6 F (37.6 C)  99 F (37.2 C) 99 F (37.2 C)   TempSrc: Oral  Oral   SpO2: 98%  100% 96%  Weight:      Height:       General.  Frail and obese lady, in no acute distress. Pulmonary.  Lungs clear bilaterally, normal respiratory effort. CV.  Regular rate and rhythm, no JVD, rub or murmur. Abdomen.  Soft, nontender, nondistended, BS positive. CNS.  Alert and oriented .  No focal neurologic deficit. Extremities.  No edema, no cyanosis, pulses intact and symmetrical.    Data Reviewed: Prior data reviewed  Family Communication: Discussed with sister at bedside  Disposition: Status is: Inpatient Remains inpatient appropriate because: Severity of illness  Planned Discharge Destination: Skilled nursing facility  Time spent: 50 minutes  This record has been created using Conservation officer, historic buildings. Errors have been sought and corrected,but may not always be located. Such creation errors do not reflect on the standard of care.   Author: Arnetha Courser, MD 03/18/2023 3:54 PM  For on call review [email or web address].

## 2023-03-19 DIAGNOSIS — Z7189 Other specified counseling: Secondary | ICD-10-CM

## 2023-03-19 DIAGNOSIS — I959 Hypotension, unspecified: Secondary | ICD-10-CM | POA: Diagnosis not present

## 2023-03-19 DIAGNOSIS — I5020 Unspecified systolic (congestive) heart failure: Secondary | ICD-10-CM | POA: Diagnosis not present

## 2023-03-19 DIAGNOSIS — Z515 Encounter for palliative care: Secondary | ICD-10-CM | POA: Diagnosis not present

## 2023-03-19 DIAGNOSIS — E2749 Other adrenocortical insufficiency: Secondary | ICD-10-CM | POA: Diagnosis not present

## 2023-03-19 DIAGNOSIS — I5023 Acute on chronic systolic (congestive) heart failure: Secondary | ICD-10-CM | POA: Diagnosis not present

## 2023-03-19 DIAGNOSIS — I5043 Acute on chronic combined systolic (congestive) and diastolic (congestive) heart failure: Secondary | ICD-10-CM | POA: Diagnosis not present

## 2023-03-19 LAB — RENAL FUNCTION PANEL
Albumin: <1.5 g/dL — ABNORMAL LOW (ref 3.5–5.0)
Anion gap: 7 (ref 5–15)
BUN: 18 mg/dL (ref 8–23)
CO2: 29 mmol/L (ref 22–32)
Calcium: 7.7 mg/dL — ABNORMAL LOW (ref 8.9–10.3)
Chloride: 94 mmol/L — ABNORMAL LOW (ref 98–111)
Creatinine, Ser: 0.63 mg/dL (ref 0.44–1.00)
GFR, Estimated: >60 mL/min (ref >60)
Glucose, Bld: 77 mg/dL (ref 70–99)
Phosphorus: 3.5 mg/dL (ref 2.5–4.6)
Potassium: 3.7 mmol/L (ref 3.5–5.1)
Sodium: 130 mmol/L — ABNORMAL LOW (ref 135–145)

## 2023-03-19 LAB — RETICULOCYTES
Immature Retic Fract: 16 % — ABNORMAL HIGH (ref 2.3–15.9)
RBC.: 2.75 MIL/uL — ABNORMAL LOW (ref 3.87–5.11)
Retic Count, Absolute: 66.8 10*3/uL (ref 19.0–186.0)
Retic Ct Pct: 2.4 % (ref 0.4–3.1)

## 2023-03-19 LAB — CBC
HCT: 23.7 % — ABNORMAL LOW (ref 36.0–46.0)
Hemoglobin: 7.7 g/dL — ABNORMAL LOW (ref 12.0–15.0)
MCH: 27.8 pg (ref 26.0–34.0)
MCHC: 32.5 g/dL (ref 30.0–36.0)
MCV: 85.6 fL (ref 80.0–100.0)
Platelets: 615 10*3/uL — ABNORMAL HIGH (ref 150–400)
RBC: 2.77 MIL/uL — ABNORMAL LOW (ref 3.87–5.11)
RDW: 15.7 % — ABNORMAL HIGH (ref 11.5–15.5)
WBC: 9.2 10*3/uL (ref 4.0–10.5)
nRBC: 0 % (ref 0.0–0.2)

## 2023-03-19 LAB — GLUCOSE, CAPILLARY: Glucose-Capillary: 114 mg/dL — ABNORMAL HIGH (ref 70–99)

## 2023-03-19 LAB — IRON AND TIBC: Iron: 21 ug/dL — ABNORMAL LOW (ref 28–170)

## 2023-03-19 LAB — OCCULT BLOOD X 1 CARD TO LAB, STOOL: Fecal Occult Bld: NEGATIVE

## 2023-03-19 LAB — FOLATE: Folate: 31 ng/mL

## 2023-03-19 LAB — IMMUNOFIXATION, URINE

## 2023-03-19 LAB — FERRITIN: Ferritin: 740 ng/mL — ABNORMAL HIGH (ref 11–307)

## 2023-03-19 LAB — VITAMIN B12: Vitamin B-12: 2647 pg/mL — ABNORMAL HIGH (ref 180–914)

## 2023-03-19 MED ORDER — SODIUM CHLORIDE 0.9 % IV SOLN: INTRAVENOUS | Status: AC

## 2023-03-19 MED ORDER — IPRATROPIUM-ALBUTEROL 0.5-2.5 (3) MG/3ML IN SOLN
3.0000 mL | Freq: Three times a day (TID) | RESPIRATORY_TRACT | Status: DC
  Administered 2023-03-19 – 2023-03-21 (×4): 3 mL via RESPIRATORY_TRACT
  Filled 2023-03-19 (×4): qty 3

## 2023-03-19 MED ORDER — VANCOMYCIN HCL 125 MG PO CAPS
125.0000 mg | ORAL_CAPSULE | Freq: Four times a day (QID) | ORAL | Status: DC
  Administered 2023-03-19 – 2023-03-27 (×33): 125 mg via ORAL
  Filled 2023-03-19 (×38): qty 1

## 2023-03-19 MED ORDER — IPRATROPIUM-ALBUTEROL 0.5-2.5 (3) MG/3ML IN SOLN
3.0000 mL | Freq: Four times a day (QID) | RESPIRATORY_TRACT | Status: DC
  Administered 2023-03-19 (×2): 3 mL via RESPIRATORY_TRACT
  Filled 2023-03-19 (×2): qty 3

## 2023-03-19 NOTE — Telephone Encounter (Signed)
 PT called back - no documentation of anyone initiating a call to pt.  Pt is in room 254 at this time.  Pt advised that I suspect someone was trying to schedule her follow-up and that appt should be scheduled before she leaves the hospital.

## 2023-03-19 NOTE — Progress Notes (Signed)
 Physical Therapy Treatment Patient Details Name: TAMULA MORRICAL MRN: [id number] DOB: November 05, 1950 Today's Date: 03/19/2023   History of Present Illness Pt is a 73 y.o. female admitted with sepsis, adrenal insufficiency, NSTEMI and recurrent UTI. PMH significant for COPD, T2 compression fx, OA, CHF with EF of 40-45%, depression, obesity, anemia, DVT and PE on Eliquis, temporal arteritis, renal cell carcinoma, adrenal insufficiency, s/p for colostomy secondary to perforated diverticulitis.    PT Comments  Pt's activity tolerance was very limited today, pt reporting severe pain with movement of LE's and Ue's. Attempted sit> stand with STEDY, pt would not tolerate LE weight bearing, reporting severe pain in feet and knees. Worked on heel and quad stretching in sitting with feet on STEDY pad to prepare for standing; pt unable to stand at this time. Pt did demonstrate some progress with sitting balance and increased activity tolerance in sitting at supervision level.  Pt continues to require Max A +2 for bed mobility and total A with transfers.  Continued PT will assist pt towards greater activity tolerance for LE weight bearing and LE/ trunk strength to decrease burden of care with functional mobility tasks.    If plan is discharge home, recommend the following: Two people to help with walking and/or transfers;A lot of help with bathing/dressing/bathroom;Help with stairs or ramp for entrance;Assist for transportation   Can travel by private vehicle     No  Equipment Recommendations  Other (comment) (TBD)    Recommendations for Other Services       Precautions / Restrictions Precautions Precautions: Fall Recall of Precautions/Restrictions: Intact Precaution/Restrictions Comments: monitor HR, BP; R heart cath 03/16/23 Restrictions Weight Bearing Restrictions Per Provider Order: No Other Position/Activity Restrictions: RUE NWB per cardiology "broken wing" protocol s/p intervention     Mobility   Bed Mobility Overal bed mobility: Needs Assistance Bed Mobility: Supine to Sit, Sit to Supine, Rolling Rolling: Max assist, +2 for physical assistance, +2 for safety/equipment   Supine to sit: Max assist, +2 for physical assistance, +2 for safety/equipment Sit to supine: Max assist, HOB elevated, +2 for physical assistance, +2 for safety/equipment   General bed mobility comments: assistance for LE and trunk support. cues for technique    Transfers                   General transfer comment: Attempted sit> stand with STEDY, pt would not tolerate LE weight  bearing, reporting severe pain in feet and knees.  Worked on heel  and quad stretching sitting with feet on STEDY pad to prepare for standing; pt unable at this time.    Ambulation/Gait               General Gait Details: unable   Stairs             Wheelchair Mobility     Tilt Bed    Modified Rankin (Stroke Patients Only)       Balance Overall balance assessment: Needs assistance Sitting-balance support: Feet supported Sitting balance-Leahy Scale: Fair Sitting balance - Comments: with fatigue pt needed cues for maintaining upright posture. Postural control: Posterior lean, Right lateral lean   Standing balance-Leahy Scale: Zero Standing balance comment: deferred                            Communication Communication Communication: No apparent difficulties  Cognition   Behavior During Therapy: WFL for tasks assessed/performed   PT - Cognitive impairments: No apparent  impairments                         Following commands: Intact      Cueing Cueing Techniques: Verbal cues  Exercises Total Joint Exercises Towel Squeeze: AROM, Both, 5 reps    General Comments General comments (skin integrity, edema, etc.): SPO2 98% HR 99bpm RA      Pertinent Vitals/Pain Pain Assessment Pain Score: 8  Pain Location: all over body Pain Descriptors / Indicators: Aching Pain  Intervention(s): Monitored during session, Premedicated before session    Home Living                          Prior Function            PT Goals (current goals can now be found in the care plan section) Acute Rehab PT Goals Patient Stated Goal: to get better PT Goal Formulation: With patient Time For Goal Achievement: 03/23/23 Potential to Achieve Goals: Good Additional Goals Additional Goal #1: Pt will be able to perform bed mobility/transfers with min assist +2 and RW in order to improve functional independence Progress towards PT goals: Not progressing toward goals - comment (pain in LEs seems to be limiting pts progress.)    Frequency    Min 1X/week      PT Plan      Co-evaluation              AM-PAC PT "6 Clicks" Mobility   Outcome Measure  Help needed turning from your back to your side while in a flat bed without using bedrails?: A Lot Help needed moving from lying on your back to sitting on the side of a flat bed without using bedrails?: Total Help needed moving to and from a bed to a chair (including a wheelchair)?: Total Help needed standing up from a chair using your arms (e.g., wheelchair or bedside chair)?: Total Help needed to walk in hospital room?: Total Help needed climbing 3-5 steps with a railing? : Total 6 Click Score: 7    End of Session Equipment Utilized During Treatment: Gait belt Activity Tolerance: Patient limited by fatigue Patient left: in bed;with call bell/phone within reach;with bed alarm set Nurse Communication: Mobility status PT Visit Diagnosis: Muscle weakness (generalized) (M62.81);Unsteadiness on feet (R26.81);Difficulty in walking, not elsewhere classified (R26.2)     Time: 1610-9604 PT Time Calculation (min) (ACUTE ONLY): 28 min  Charges:    $Therapeutic Activity: 23-37 mins PT General Charges $$ ACUTE PT VISIT: 1 Visit                     Hortencia Conradi, PTA  03/19/23, 12:50 PM

## 2023-03-19 NOTE — Progress Notes (Signed)
 Rounding Note    Patient Name: Wendy Sanchez Date of Encounter: 03/19/2023  [facility] Cardiologist: Julien Nordmann, MD   Subjective   Patient reports worsening productive cough and shortness of breath. She also endorses continued diarrhea with positive c diff by PCR. Overall improving from a cardiac perspective. BP remains low.   Inpatient Medications    Scheduled Meds:  amiodarone  200 mg Oral Daily   apixaban  5 mg Oral BID   ascorbic acid  500 mg Oral Daily   calcium carbonate  2 tablet Oral Q breakfast   cholecalciferol  1,000 Units Oral Daily   cyanocobalamin  1,000 mcg Oral Daily   famotidine  20 mg Oral BID WC   ferrous sulfate  325 mg Oral BID WC   fluticasone  1 spray Each Nare Daily   folic acid  1 mg Oral Daily   gabapentin  300 mg Oral TID   hydrocortisone  15 mg Oral Q breakfast   hydrocortisone  5 mg Oral BID AC   ipratropium-albuterol  3 mL Nebulization Q6H   lactose free nutrition  237 mL Oral TID BM   loratadine  10 mg Oral QPM   metoprolol succinate  12.5 mg Oral Daily   midodrine  15 mg Oral TID WC   montelukast  10 mg Oral Daily   nortriptyline  50 mg Oral QHS   pantoprazole  40 mg Oral Q breakfast   polyvinyl alcohol  1 drop Both Eyes TID   pyridostigmine  30 mg Oral BID   scopolamine  1 patch Transdermal Q72H   sodium chloride flush  3 mL Intravenous Q12H   spironolactone  12.5 mg Oral Daily   thiamine  100 mg Oral Daily   torsemide  20 mg Oral Daily   vancomycin  125 mg Oral QID   vitamin A  10,000 Units Oral Daily   Continuous Infusions:  PRN Meds: oxyCODONE **AND** acetaminophen, acetaminophen, acetaminophen, albuterol, dextromethorphan-guaiFENesin, diphenhydrAMINE, docusate sodium, liver oil-zinc oxide, melatonin, ondansetron (ZOFRAN) IV, ondansetron (ZOFRAN) IV, phenol, sodium chloride flush   Vital Signs    Vitals:   03/18/23 1951 03/19/23 0015 03/19/23 0508 03/19/23 0809  BP: 91/65 94/62 (!) 92/58 (!) 90/59  Pulse:  84 79 76 80  Resp: 16 16 16    Temp: 98.4 F (36.9 C) 98.5 F (36.9 C) 97.7 F (36.5 C) 97.9 F (36.6 C)  TempSrc: Oral Oral Oral   SpO2: 97% 96% 99% 100%  Weight:   117.4 kg   Height:        Intake/Output Summary (Last 24 hours) at 03/19/2023 0909 Last data filed at 03/19/2023 0600 Gross per 24 hour  Intake 720 ml  Output 1065 ml  Net -345 ml      03/19/2023    5:08 AM 03/18/2023    4:07 AM 03/17/2023    5:00 AM  Last 3 Weights  Weight (lbs) 258 lb 13.1 oz 259 lb 11.2 oz 257 lb 11.5 oz  Weight (kg) 117.4 kg 117.8 kg 116.9 kg      Telemetry    Not on telemetry - Personally Reviewed  Physical Exam   GEN: No acute distress.   Neck: + JVD Cardiac: RRR, no murmurs, rubs, or gallops.  Respiratory: Coarse breath sounds bilaterally GI: Soft, nontender, non-distended  MS: Wraps on lower leg bilaterally; No deformity. Neuro:  Nonfocal  Psych: Normal affect   Labs    High Sensitivity Troponin:   Recent Labs  Lab  03/07/23 2251 03/07/23 2353 03/08/23 0341 03/08/23 0515 03/08/23 1054  TROPONINIHS 251* 241* 150* 113* 61*     Chemistry Recent Labs  Lab 03/13/23 0511 03/15/23 0520 03/16/23 0758 03/17/23 0445 03/18/23 0600 03/19/23 0547  NA 136 135   < > 135 135 130*  K 3.5 3.9   < > 3.5 3.8 3.7  CL 99 99   < > 98 99 94*  CO2 30 30   < > 30 28 29   GLUCOSE 83 87   < > 93 77 77  BUN 19 19   < > 22 18 18   CREATININE 0.60 0.58   < > 0.76 0.69 0.63  CALCIUM 8.1* 8.1*   < > 7.7* 7.8* 7.7*  MG 1.9 1.9  --   --   --   --   ALBUMIN  --   --    < > 1.5* <1.5* <1.5*  GFRNONAA >60 >60   < > >60 >60 >60  ANIONGAP 7 6   < > 7 8 7    < > = values in this interval not displayed.    Lipids No results for input(s): "CHOL", "TRIG", "HDL", "LABVLDL", "LDLCALC", "CHOLHDL" in the last 168 hours.  Hematology Recent Labs  Lab 03/17/23 0445 03/18/23 0600 03/19/23 0547  WBC 16.3* 13.4* 9.2  RBC 2.96* 3.08* 2.77*  2.75*  HGB 8.3* 8.5* 7.7*  HCT 24.7* 25.6* 23.7*  MCV 83.4 83.1  85.6  MCH 28.0 27.6 27.8  MCHC 33.6 33.2 32.5  RDW 16.0* 16.0* 15.7*  PLT 612* 576* 615*   Thyroid No results for input(s): "TSH", "FREET4" in the last 168 hours.  BNPNo results for input(s): "BNP", "PROBNP" in the last 168 hours.  DDimer No results for input(s): "DDIMER" in the last 168 hours.   Radiology    CT ABDOMEN PELVIS WO CONTRAST Result Date: 03/17/2023 IMPRESSION: Postoperative changes consistent with the clinical history and left-sided ostomy. Diverticulosis without diverticulitis. Chronic changes as described above. Electronically Signed   By: Alcide Clever M.D.   On: 03/17/2023 22:41   Cardiac Studies   03/16/2023 Cardiac MRI:  1.  Small circumferential pericardial effusion. 2.  Mildly dilated LV with diffuse hypokinesis, EF 30%. 3.  Normal RV size with RV EF 38%. 4. Difficult delayed enhancement images, looked hard to null LV myocardium. Possible small area of mid-wall LGE in the mid inferior wall, nonspecific. 5.  Extracellular volume percentage significantly elevated at 41%.   03/16/2023 RHC/LHC: 1. Low filling pressures.  2. Normal cardiac output.  3. 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  4. Mild aortic stenosis, peak-to-peak gradient 18 mmHg  03/09/2023 Echo complete 1. Left ventricular ejection fraction, by estimation, is 35 to 40%. The  left ventricle has moderately decreased function. The left ventricle  demonstrates global hypokinesis. The left ventricular internal cavity size  was severely dilated. There is mild  left ventricular hypertrophy. Left ventricular diastolic parameters are  consistent with Grade I diastolic dysfunction (impaired relaxation).  Elevated left atrial pressure. The average left ventricular global  longitudinal strain is -8.6 %. The global  longitudinal strain is abnormal.   2. Right ventricular systolic function is normal. The right ventricular  size is mildly enlarged. There is normal pulmonary  artery systolic  pressure.   3. Left atrial size was mildly dilated.   4. A small pericardial effusion is present. The pericardial effusion is  posterior to the left ventricle.   5. The mitral valve is degenerative. Mild mitral  valve regurgitation. No  evidence of mitral stenosis.   6. Tricuspid valve regurgitation is mild to moderate.   7. The aortic valve is tricuspid. There is moderate calcification of the  aortic valve. There is moderate thickening of the aortic valve. Aortic  valve regurgitation is trivial. Mild aortic valve stenosis. Aortic valve  mean gradient measures 10.0 mmHg.   8. There is mild dilatation of the ascending aorta, measuring 38 mm.   9. The inferior vena cava is dilated in size with >50% respiratory  variability, suggesting right atrial pressure of 8 mmHg.   Patient Profile     73 y.o. female with past medical history of arthritis, asthma, CHF, COPD, fibromyalgia, GERD, hyperlipidemia, hypertension, morbid obesity status post gastric banding, NICM, DVT/PE on apixaban, PVCs, OSA not on CPAP, frequent PVCs status post ablation, spinal stenosis, temporal arteritis, and vertigo, who is being seen for the evaluation of acute on chronic CHF.   Assessment & Plan    Acute on chronic CHF Orthostatic hypotension - Echo in 2018 with EF 25-30% byt improved back to normal range - Echo this admission with EF 35-40%, severe LV dilation, mild LVH, normal RV, mild MR, mild AS - RHC 3/4 showed low filling pressures, preserved cardiac output and angiography without CAD, nonischemic cardiomyopathy - Cardiac MRI showed LV EF 30%, RV EF 38%, myocardium difficult to null with ECV 41%. Some features of this study were consistent with cardiac amyloidosis: small pericardial effusion, ECV > 40%, LV myocardium difficult to null. However, the LV wall was not significantly thickened.  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

## 2023-03-19 NOTE — Progress Notes (Addendum)
 Progress Note   Patient: Wendy Sanchez UJW:[id number] DOB: September 22, 1950 DOA: 03/07/2023     12 DOS: the patient was seen and examined on 03/19/2023   Brief hospital course: Taken from H&P  Wendy Sanchez is a 73 y.o. female with medical history significant of COPD, CHF with EF of 40-45%, depression, obesity, anemia, DVT and PE on Eliquis, temporal arteritis, renal cell carcinoma, adrenal insufficiency, s/p for colostomy secondary to perforated diverticulitis, who presents with fever, increased urinary frequency.   Patient was found to have hypotension with blood pressure 86/59, which improved to 101/70 after giving 2 L LR bolus in ED, but blood pressure has dropped again to 82/61 with MAP of 69 when I saw pt in ED. Her mental status is normal.  pt was found to have WBC 10.3, GFR> 60, negative PCR for COVID, flu and RSV, UA (hazy appearance, small amount of leukocyte, rare bacteria, WBC 11-20), lactic acid of 1.7 --> 2.2 --> 1.5, troponin 217 --> 251.  Chest x-ray negative for infiltration.  EKG: Sinus rhythm, QTc 484, LAE, LAD, poor R wave progression, low voltage.   2/24: Blood pressure with some improvement to 97/63, patient was started on midodrine and stress doses of steroid.  Elevated leukocytosis today likely secondary to stress doses of steroid.  Preliminary blood cultures negative.  Urine cultures pending.  Troponin peaked at 251 and now trending down.  Family is very concerned about this recurrent UTI, patient has multiple hospitalization for this and has been seen outpatient urology, there were recommending continuing antibiotics at suppressive doses.  2/25: Vital stable.  Blood pressure seems within goal now.  Mild hypokalemia which are being repleted.  Blood cultures remain negative, urine cultures with no growth.  Stopping antibiotics.  Sepsis ruled out.  Increasing the dose of midodrine. Had an episode of sinus tachycardia, restarting home metoprolol and torsemide.  PT is recommending  SNF, not sure if she has any days left  2/26: Vital stable with blood pressure in low 100s.  Echo with EF of 35 to 40%, left ventricular global hypokinesis and severely dilated cavity, grade 1 diastolic dysfunction.  Small pericardial effusion. Patient currently stable to go back to her facility, she needs another insurance authorization which was started today.  She will go back once insurance authorization got approval.  Patient is high risk for readmission and mortality based on underlying comorbidities and poor functional status.  Palliative care was also consulted.  3/5: Patient with new leukocytosis, abdominal pain, watery diarrhea and upper respiratory symptoms.  Respiratory panel was checked and came back positive for RSV.  Patient was also recently started on pyridostigmine which can cause abdominal pain and diarrhea per ID, ordered CT abdomen to rule out any colitis.  Cardiology was also consulted and patient underwent cardiac MRI and right and left cardiac catheterization with following results Cardiac MRI:  1.  Small circumferential pericardial effusion. 2.  Mildly dilated LV with diffuse hypokinesis, EF 30%. 3.  Normal RV size with RV EF 38%. 4. Difficult delayed enhancement images, looked hard to null LV myocardium. Possible small area of mid-wall LGE in the mid inferior wall, nonspecific. 5.  Extracellular volume percentage significantly elevated at 41%.   RHC/LHC: 1. Low filling pressures.  2. Normal cardiac output.  3. 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  4. 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 She was also started on amiodarone for concern of SVT and  PVCs.  3/6: Febrile at 101.4 over the past 24-hour.  Patient with more upper respiratory symptoms.  Continue to have mild abdominal discomfort but mostly pointing towards epigastrium, loose stools in  colostomy bag.  Improving leukocytosis, C. difficile came back positive for antigen and negative for toxin-pending PCR.  GI pathogen panel and UA was also ordered and pending.  CT abdomen was negative for any concern of colitis or diverticulitis.  Chest x-ray normal.  Likely due to RSV  3/7: C. difficile PCR came back positive so started on p.o. vancomycin.  GI pathogen panel is also positive for EPEC-no need to treat per ID. Hemoglobin decreased to 7.7 without any obvious bleeding, FOBT was negative.  Anemia panel with low iron, TIBC was not calculated. Likely anemia of chronic disease with mild iron deficiency. UA with large leukocytes-urine cultures ordered Also developed mild hyponatremia-giving some normal saline   Assessment and Plan: Secondary adrenal insufficiency (HCC) Blood pressure currently borderline soft Patient received stress doses of Solu-Cortef Continue home Cortef Increasing the dose of midodrine  Abdominal pain RSV infection And developed  fever with leukocytosis overnight, leukocytosis started improving. Found to have RSV positive, C. difficile antigen and PCR positive, GI pathogen panel for E. Coli, UA with large leukocytes-pending cultures now -CT abdomen negative for diverticulitis or colitis. -Chest x-ray normal -ID was also consulted-started her on p.o. vancomycin -Supportive care  Myocardial injury Troponin peaked at 251 and then started trending down.  No chest pain.  Likely demand ischemia with concern of sepsis. -Echocardiogram EF of 35 to 40%, severe LV dilation,mild LVH, normal RV, mild MR, mild aortic stenosis.  Cardiac catheterization with concern of nonischemic cardiomyopathy -Continue aspirin 81 mg daily -Completed 48 hours of heparin infusion.  Chronic obstructive pulmonary disease (COPD) (HCC) No concern of exacerbation. -Continue home bronchodilators  Chronic pain syndrome -As needed Tylenol and oxycodone  HFrEF (heart failure with reduced  ejection fraction) (HCC)  2D echo on 10/07/2022 showed EF of 40-45%.  Patient has 1+ leg edema, but no worsening shortness breath, does not seem to have CHF exacerbation.  BNP of 193 Repeat echo with EF of 35 to 40%, severe LV dilation,mild LVH, normal RV, mild MR, mild aortic stenosis.  Patient also underwent right and left cardiac catheterization which shows nonischemic cardiomyopathy. 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 -Cardiology is on board and recommending continue low-dose Toprol and spironolactone -Continue amiodarone  History of pulmonary embolus 2020(PE) Restarting home Eliquis  Depression with anxiety -Continue home meds  Obesity (BMI 30-39.9) Estimated body mass index is 39.34 kg/m as calculated from the following:   Height as of this encounter: 5\' 8"  (1.727 m).   Weight as of this encounter: 117.3 kg.   -This will complicate overall prognosis -Encouraged weight loss  Pressure injury of skin Pressure Injury 03/08/23 Ischial tuberosity Left Stage 2 -  Partial thickness loss of dermis presenting as a shallow open injury with a red, pink wound bed without slough. (Active)  03/08/23 1100  Location: Ischial tuberosity  Location Orientation: Left  Staging: Stage 2 -  Partial thickness loss of dermis presenting as a shallow open injury with a red, pink wound bed without slough.  Wound Description (Comments):   Present on Admission: Yes     Pressure Injury 03/08/23 Ischial tuberosity Right Stage 2 -  Partial thickness loss of dermis presenting as a shallow open injury with a red, pink wound bed without slough. (Active)  03/08/23 1100  Location: Ischial tuberosity  Location Orientation: Right  Staging: Stage 2 -  Partial thickness loss of dermis presenting as a shallow open injury with a red, pink wound bed without slough.  Wound Description (Comments):   Present on Admission: Yes   Present on admission     Subjective:  Patient continued to have some cough and congestion but stating that it is improving.  No more abdominal pain.  Diarrhea seems improving.  Appetite remained poor.  Physical Exam: Vitals:   03/19/23 0809 03/19/23 0944 03/19/23 1219 03/19/23 1347  BP: (!) 90/59 (!) 90/59 (!) 92/57   Pulse: 80 80 89   Resp:      Temp: 97.9 F (36.6 C)  98.5 F (36.9 C)   TempSrc:      SpO2: 100%  95% 96%  Weight:      Height:       General.  Frail and obese lady, in no acute distress. Pulmonary.  Lungs clear bilaterally, normal respiratory effort. CV.  Regular rate and rhythm, no JVD, rub or murmur. Abdomen.  Soft, nontender, nondistended, BS positive. CNS.  Alert and oriented .  No focal neurologic deficit. Extremities.  No edema, no cyanosis, pulses intact and symmetrical.   Data Reviewed: Prior data reviewed  Family Communication:   Disposition: Status is: Inpatient Remains inpatient appropriate because: Severity of illness  Planned Discharge Destination: Skilled nursing facility  Time spent: 50 minutes  This record has been created using Conservation officer, historic buildings. Errors have been sought and corrected,but may not always be located. Such creation errors do not reflect on the standard of care.   Author: Arnetha Courser, MD 03/19/2023 3:44 PM  For on call review [email or web address].

## 2023-03-19 NOTE — Progress Notes (Signed)
 [facility]  Date of Admission:  03/07/2023   Total days of inpatient antibiotics 3  Principal Problem:   Hypotension Active Problems:   Chronic pain syndrome   Chronic obstructive pulmonary disease (COPD) (HCC)   HFrEF (heart failure with reduced ejection fraction) (HCC)   History of pulmonary embolus 2020(PE)   Secondary adrenal insufficiency (HCC)   Obesity (BMI 30-39.9)   Myocardial injury   Depression with anxiety   Pressure injury of skin   Sepsis (HCC)   Nonischemic cardiomyopathy (HCC)   Acute on chronic HFrEF (heart failure with reduced ejection fraction) (HCC)   SVT (supraventricular tachycardia) (HCC)   Abdominal pain          Assessment: 73 year old female with history of recurrent UTI, COPD, CHF, temporal arteritis on, renal cell carcinoma, status post colostomy secondary perforated diverticulitis initially admitted for sepsis 2/2 UTI, now managed for adrenal insufficiency p,.She developed fevers and abdominal pain ID engaged. #Multifacotrial cause of fever with leukocytosis and abdominal pain with diarrhea #RSV+ #Cdiff and EPC stool + #History of recurrent UTI,  #Diverticulitis status post colostomy for perforated diverticulitis #Hypotension in the setting of adrenal insufficiency on midodrine and pyridostigmine - CT abdomen pelvis did not show any acute abnormalities - I spoke to patient's daughter and she states that her abdominal pain is consistent with prior UTIs.She did have similar symptoms on admission with negative urine cultures.  -Testing revealed: C. Diff Ag+, toxin-(toxin and Ag negative on 11/08/20), pcr +, GIP: EPEC+, RSV+ on reps panel  Recommendations: -Given fever and watery stools with abdominal pain will plan to treat with vanc 125mg  PO qid x 10days(EOT 3/16). I think it is unclear if she truly has a c diff infection given EPEC is positive on GIP and RSV+ resp panel. Likely multifactorial cause of her  symptoms.   ID will sign off  Evaluation of this patient requires complex antimicrobial therapy evaluation and counseling + isolation needs for disease transmission risk assessment and mitigation   Microbiology:   Antibiotics: Cefepime 2/23 Merrem 2/23-24 Vnaomcyin 2/23-24   Cultures: Blood 2/23 ng 2/23 urine Cx ng   SUBJECTIVE: Resting in bed. Reports continued abd pain Interval: afebrile overnight. Wbc 9.2k  Review of Systems: Review of Systems  All other systems reviewed and are negative.    Scheduled Meds:  amiodarone  200 mg Oral Daily   apixaban  5 mg Oral BID   ascorbic acid  500 mg Oral Daily   calcium carbonate  2 tablet Oral Q breakfast   cholecalciferol  1,000 Units Oral Daily   cyanocobalamin  1,000 mcg Oral Daily   famotidine  20 mg Oral BID WC   ferrous sulfate  325 mg Oral BID WC   fluticasone  1 spray Each Nare Daily   folic acid  1 mg Oral Daily   gabapentin  300 mg Oral TID   hydrocortisone  15 mg Oral Q breakfast   hydrocortisone  5 mg Oral BID AC   ipratropium-albuterol  3 mL Nebulization Q6H   lactose free nutrition  237 mL Oral TID BM   loratadine  10 mg Oral QPM   metoprolol succinate  12.5 mg Oral Daily   midodrine  15 mg Oral TID WC   montelukast  10 mg Oral Daily   nortriptyline  50 mg Oral QHS   pantoprazole  40 mg Oral Q breakfast   polyvinyl alcohol  1 drop Both Eyes TID  pyridostigmine  30 mg Oral BID   scopolamine  1 patch Transdermal Q72H   sodium chloride flush  3 mL Intravenous Q12H   spironolactone  12.5 mg Oral Daily   thiamine  100 mg Oral Daily   torsemide  20 mg Oral Daily   vancomycin  125 mg Oral QID   vitamin A  10,000 Units Oral Daily   Continuous Infusions: PRN Meds:.oxyCODONE **AND** acetaminophen, acetaminophen, acetaminophen, albuterol, dextromethorphan-guaiFENesin, diphenhydrAMINE, docusate sodium, liver oil-zinc oxide, melatonin, ondansetron (ZOFRAN) IV, ondansetron (ZOFRAN) IV, phenol, sodium chloride  flush Allergies  Allergen Reactions   Hydrocodone-Acetaminophen Swelling    hands   Iodine Anaphylaxis and Swelling    Iv dye 02/03/21-Ispoke to patient and she had IV contrast  X4 last year and has had no problem. ( Last CT abdomen with contrast was on 01/02/21.-    Other Other (See Comments)    ALLERGY TO METAL - BLACKENS SKIN AND CAUSES A RASH    Tape Swelling and Other (See Comments)    Skin comes off.  Paper tape is ok tegaderm OK   Peanut Oil Other (See Comments)    ** Nuts cause runny nose   Shellfish Allergy Nausea And Vomiting and Swelling    Episode of GI infection after eating clam chowder. Still eats shrimp and other seafood    OBJECTIVE: Vitals:   03/19/23 0015 03/19/23 0508 03/19/23 0809 03/19/23 0944  BP: 94/62 (!) 92/58 (!) 90/59 (!) 90/59  Pulse: 79 76 80 80  Resp: 16 16    Temp: 98.5 F (36.9 C) 97.7 F (36.5 C) 97.9 F (36.6 C)   TempSrc: Oral Oral    SpO2: 96% 99% 100%   Weight:  117.4 kg    Height:       Body mass index is 39.35 kg/m.  Physical Exam Constitutional:      Appearance: Normal appearance.  HENT:     Head: Normocephalic and atraumatic.     Right Ear: Tympanic membrane normal.     Left Ear: Tympanic membrane normal.     Nose: Nose normal.     Mouth/Throat:     Mouth: Mucous membranes are moist.  Eyes:     Extraocular Movements: Extraocular movements intact.     Conjunctiva/sclera: Conjunctivae normal.     Pupils: Pupils are equal, round, and reactive to light.  Cardiovascular:     Rate and Rhythm: Normal rate and regular rhythm.     Heart sounds: No murmur heard.    No friction rub. No gallop.  Pulmonary:     Effort: Pulmonary effort is normal.     Breath sounds: Normal breath sounds.  Abdominal:     Comments: colostomy  Musculoskeletal:        General: Normal range of motion.  Skin:    General: Skin is warm and dry.  Neurological:     General: No focal deficit present.     Mental Status: She is alert and oriented to  person, place, and time.  Psychiatric:        Mood and Affect: Mood normal.       Lab Results Lab Results  Component Value Date   WBC 9.2 03/19/2023   HGB 7.7 (L) 03/19/2023   HCT 23.7 (L) 03/19/2023   MCV 85.6 03/19/2023   PLT 615 (H) 03/19/2023    Lab Results  Component Value Date   CREATININE 0.63 03/19/2023   BUN 18 03/19/2023   NA 130 (L) 03/19/2023   K 3.7  03/19/2023   CL 94 (L) 03/19/2023   CO2 29 03/19/2023    Lab Results  Component Value Date   ALT 15 03/07/2023   AST 23 03/07/2023   ALKPHOS 168 (H) 03/07/2023   BILITOT 0.7 03/07/2023        Danelle Earthly, MD [facility] 03/19/2023, 11:30 AM

## 2023-03-19 NOTE — Consult Note (Addendum)
 WOC Nurse Consult Note: Pt has a colostomy; she states her son performed pouch changes and emptying when at home and she had total assistance with ostomy care at a facility prior to admission.  Current pouch is intact with good seal and Pt states it was changed 2 days ago.  Mod amt liquid brown stool in the pouch. Supplies left at the bedside for staff nurses use. Use Supplies: barrier ring, Lawson # [id number], wafer [place] # 2, pouch Lawson # [id number]  Reason for Consult: Heart failure team has ordered bilat Una boots to be changed. Previous compression wraps have already been removed. No open wounds or drainage, mod amt edema to bilat legs. Applied bilat [employer] and coban as requested.  WOC team will change again next Thursday.   Pt will need orders to have the compression wraps changed weekly after discharge.  Thank-you,  Cammie Mcgee MSN, RN, CWOCN, [place], CNS [phone]

## 2023-03-19 NOTE — Progress Notes (Signed)
 Daily Progress Note   Patient Name: Wendy Sanchez       Date: 03/19/2023 DOB: [birth year]  Age: 73 y.o. MRN#: [id number] Attending Physician: Arnetha Courser, MD Primary Care Physician: Pcp, No Admit Date: 03/07/2023  Reason for Consultation/Follow-up: Establishing goals of care  HPI/Brief Hospital Review: 73 y.o. 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    Noted recent admit 1/17-1/23 for sepsis without obvious sign of infection and hypotension felt to be chronic and related to adrenal insufficiency   Recent hospitalization at [facility] 1/10-1/15 for E. 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 Diff antigen positive toxin (-), CT abdomen (-), CXR (-)  Palliative medicine was consulted for assisting with goals of care conversations   Subjective: Extensive chart review has been completed prior to meeting patient including labs, vital signs, imaging,  progress notes, orders, and available advanced directive documents from current and previous encounters.    Visited with Wendy Sanchez at her bedside. She is awake, alert, oriented and able to engage in conversations. No family at bedside during time of visit.  Wendy Sanchez recalls our previous visits from last weekend. Assessed symptoms, she denies acute pain or discomfort, resting well at night, appetite has been altered due to nausea and liquid stools.  Wendy Sanchez able to share with me her understanding of current medical situation. Aware she is now being followed by heart failure team and she is able to share her understanding of severity of underlying heart disease, complicated by multiple other comorbid conditions.  Attempted to elicit goals of care. Again discussed code status, Wendy Sanchez remains clear that  at this time she would be accepting of all resuscitative efforts but would not be accepting of long term life preserving measures.  Answered and addressed all questions and concerns. PMT will continue to remain available to support patient holistically but will continue to follow peripherally and step away from daily visits. Please reengage if needs or concerns arise.  Objective:  Physical Exam Constitutional:      General: She is not in acute distress.    Appearance: She is ill-appearing.  Pulmonary:     Effort: Pulmonary effort is normal. No respiratory distress.  Skin:    General: Skin is warm and dry.  Neurological:     Mental Status: She is alert and oriented to person, place, and time.     Motor: Weakness present.             Vital Signs: BP (!) 92/57 (BP Location: Left Arm)   Pulse 89   Temp 98.5 F (36.9 C)   Resp 16   Ht 5\' 8"  (1.727 m)   Wt 117.4 kg   SpO2 96%   BMI 39.35 kg/m  SpO2: SpO2: 96 % O2 Device: O2 Device: Room Air O2 Flow Rate: O2 Flow Rate (L/min): 1 L/min   Palliative Care Assessment & Plan   Assessment/Recommendation/Plan  Full Code-Full  Scope Time for outcomes  Thank you for allowing the Palliative Medicine Team to assist in the care of this patient.  Total time:  35 minutes  Time spent includes: Detailed review of medical records (labs, imaging, vital signs), medically appropriate exam (mental status, respiratory, cardiac, skin), discussed with treatment team, counseling and educating patient, family and staff, documenting clinical information, medication management and coordination of care.  Leeanne Deed, DNP, AGNP-C Palliative Medicine   Please contact Palliative Medicine Team phone at [phone] for questions and concerns.

## 2023-03-19 NOTE — Plan of Care (Signed)

## 2023-03-19 NOTE — Progress Notes (Signed)
 Removed Una boots and cleansed BLE per order.  New wound care order placed for RN to replace Una boots per order.

## 2023-03-20 DIAGNOSIS — I5020 Unspecified systolic (congestive) heart failure: Secondary | ICD-10-CM | POA: Diagnosis not present

## 2023-03-20 DIAGNOSIS — I5043 Acute on chronic combined systolic (congestive) and diastolic (congestive) heart failure: Secondary | ICD-10-CM | POA: Diagnosis not present

## 2023-03-20 DIAGNOSIS — J432 Centrilobular emphysema: Secondary | ICD-10-CM

## 2023-03-20 DIAGNOSIS — I959 Hypotension, unspecified: Secondary | ICD-10-CM | POA: Diagnosis not present

## 2023-03-20 DIAGNOSIS — Z86711 Personal history of pulmonary embolism: Secondary | ICD-10-CM | POA: Diagnosis not present

## 2023-03-20 DIAGNOSIS — I471 Supraventricular tachycardia, unspecified: Secondary | ICD-10-CM | POA: Diagnosis not present

## 2023-03-20 DIAGNOSIS — A419 Sepsis, unspecified organism: Secondary | ICD-10-CM | POA: Diagnosis not present

## 2023-03-20 DIAGNOSIS — I428 Other cardiomyopathies: Secondary | ICD-10-CM

## 2023-03-20 DIAGNOSIS — E2749 Other adrenocortical insufficiency: Secondary | ICD-10-CM | POA: Diagnosis not present

## 2023-03-20 LAB — CBC
HCT: 23.7 % — ABNORMAL LOW (ref 36.0–46.0)
Hemoglobin: 7.8 g/dL — ABNORMAL LOW (ref 12.0–15.0)
MCH: 28.1 pg (ref 26.0–34.0)
MCHC: 32.9 g/dL (ref 30.0–36.0)
MCV: 85.3 fL (ref 80.0–100.0)
Platelets: 621 10*3/uL — ABNORMAL HIGH (ref 150–400)
RBC: 2.78 MIL/uL — ABNORMAL LOW (ref 3.87–5.11)
RDW: 15.4 % (ref 11.5–15.5)
WBC: 8.8 10*3/uL (ref 4.0–10.5)
nRBC: 0 % (ref 0.0–0.2)

## 2023-03-20 LAB — RENAL FUNCTION PANEL
Albumin: <1.5 g/dL — ABNORMAL LOW (ref 3.5–5.0)
Anion gap: 7 (ref 5–15)
BUN: 18 mg/dL (ref 8–23)
CO2: 26 mmol/L (ref 22–32)
Calcium: 7.4 mg/dL — ABNORMAL LOW (ref 8.9–10.3)
Chloride: 100 mmol/L (ref 98–111)
Creatinine, Ser: 0.78 mg/dL (ref 0.44–1.00)
GFR, Estimated: >60 mL/min (ref >60)
Glucose, Bld: 83 mg/dL (ref 70–99)
Phosphorus: 3.4 mg/dL (ref 2.5–4.6)
Potassium: 3.5 mmol/L (ref 3.5–5.1)
Sodium: 133 mmol/L — ABNORMAL LOW (ref 135–145)

## 2023-03-20 NOTE — Plan of Care (Signed)

## 2023-03-20 NOTE — Progress Notes (Signed)
 Rounding Note    Patient Name: Wendy Sanchez Date of Encounter: 03/20/2023  [facility] Cardiologist: Julien Nordmann, MD   Subjective   Laying supine in bed Reports continued diarrhea, cough, chest congestion RSV positive, C. difficile positive Discussed recent events with her, presenting with hypotension, fever, increased urinary frequency   Inpatient Medications    Scheduled Meds:  amiodarone  200 mg Oral Daily   apixaban  5 mg Oral BID   ascorbic acid  500 mg Oral Daily   calcium carbonate  2 tablet Oral Q breakfast   cholecalciferol  1,000 Units Oral Daily   cyanocobalamin  1,000 mcg Oral Daily   famotidine  20 mg Oral BID WC   ferrous sulfate  325 mg Oral BID WC   fluticasone  1 spray Each Nare Daily   folic acid  1 mg Oral Daily   gabapentin  300 mg Oral TID   hydrocortisone  15 mg Oral Q breakfast   hydrocortisone  5 mg Oral BID AC   ipratropium-albuterol  3 mL Nebulization TID   lactose free nutrition  237 mL Oral TID BM   loratadine  10 mg Oral QPM   metoprolol succinate  12.5 mg Oral Daily   midodrine  15 mg Oral TID WC   montelukast  10 mg Oral Daily   nortriptyline  50 mg Oral QHS   pantoprazole  40 mg Oral Q breakfast   polyvinyl alcohol  1 drop Both Eyes TID   pyridostigmine  30 mg Oral BID   scopolamine  1 patch Transdermal Q72H   sodium chloride flush  3 mL Intravenous Q12H   spironolactone  12.5 mg Oral Daily   thiamine  100 mg Oral Daily   torsemide  20 mg Oral Daily   vancomycin  125 mg Oral QID   vitamin A  10,000 Units Oral Daily   Continuous Infusions:  sodium chloride Stopped (03/20/23 1107)   PRN Meds: oxyCODONE **AND** acetaminophen, acetaminophen, acetaminophen, albuterol, dextromethorphan-guaiFENesin, diphenhydrAMINE, docusate sodium, liver oil-zinc oxide, melatonin, ondansetron (ZOFRAN) IV, ondansetron (ZOFRAN) IV, phenol, sodium chloride flush   Vital Signs    Vitals:   03/20/23 0533 03/20/23 0804 03/20/23 0902  03/20/23 1314  BP: (!) 98/57  (!) 89/48 (!) 99/59  Pulse: 86 84 84 86  Resp: 18 16    Temp: 97.8 F (36.6 C)  (!) 97.5 F (36.4 C) 99.8 F (37.7 C)  TempSrc: Oral     SpO2: 99% 98% 97% 97%  Weight:      Height:        Intake/Output Summary (Last 24 hours) at 03/20/2023 1400 Last data filed at 03/20/2023 1000 Gross per 24 hour  Intake 885.59 ml  Output 1250 ml  Net -364.41 ml      03/20/2023    5:00 AM 03/19/2023    5:08 AM 03/18/2023    4:07 AM  Last 3 Weights  Weight (lbs) 260 lb 9.3 oz 258 lb 13.1 oz 259 lb 11.2 oz  Weight (kg) 118.2 kg 117.4 kg 117.8 kg      Telemetry    Telemetry personally Reviewed  ECG     - Personally Reviewed  Physical Exam   GEN: No acute distress.   Neck: No JVD Cardiac: RRR, no murmurs, rubs, or gallops.  Respiratory: Coarse breath sounds bilaterally with wheezing GI: Soft, nontender, non-distended  MS: No edema; No deformity. Neuro:  Nonfocal  Psych: Normal affect   Labs    High  Sensitivity Troponin:   Recent Labs  Lab 03/07/23 2251 03/07/23 2353 03/08/23 0341 03/08/23 0515 03/08/23 1054  TROPONINIHS 251* 241* 150* 113* 61*     Chemistry Recent Labs  Lab 03/15/23 0520 03/16/23 0758 03/18/23 0600 03/19/23 0547 03/20/23 0434  NA 135   < > 135 130* 133*  K 3.9   < > 3.8 3.7 3.5  CL 99   < > 99 94* 100  CO2 30   < > 28 29 26   GLUCOSE 87   < > 77 77 83  BUN 19   < > 18 18 18   CREATININE 0.58   < > 0.69 0.63 0.78  CALCIUM 8.1*   < > 7.8* 7.7* 7.4*  MG 1.9  --   --   --   --   ALBUMIN  --    < > <1.5* <1.5* <1.5*  GFRNONAA >60   < > >60 >60 >60  ANIONGAP 6   < > 8 7 7    < > = values in this interval not displayed.    Lipids No results for input(s): "CHOL", "TRIG", "HDL", "LABVLDL", "LDLCALC", "CHOLHDL" in the last 168 hours.  Hematology Recent Labs  Lab 03/18/23 0600 03/19/23 0547 03/20/23 0434  WBC 13.4* 9.2 8.8  RBC 3.08* 2.77*  2.75* 2.78*  HGB 8.5* 7.7* 7.8*  HCT 25.6* 23.7* 23.7*  MCV 83.1 85.6 85.3  MCH  27.6 27.8 28.1  MCHC 33.2 32.5 32.9  RDW 16.0* 15.7* 15.4  PLT 576* 615* 621*   Thyroid No results for input(s): "TSH", "FREET4" in the last 168 hours.  BNPNo results for input(s): "BNP", "PROBNP" in the last 168 hours.  DDimer No results for input(s): "DDIMER" in the last 168 hours.   Radiology    No results found.  Cardiac Studies     Patient Profile     73 y.o. 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 difficile positive  Assessment & Plan    Cardiomyopathy/chronic systolic CHF Ejection fraction 35 to 40%, severe LV dilation Currently on IV fluids given chronic diarrhea Blood pressure low but stable on midodrine 15 mg 3 times daily GDMT including metoprolol succinate 12.5 daily, spironolactone 12.5 daily, torsemide 20 daily -Recommend we reduce IV fluid rate and wean off, normal renal function  S VT Off telemetry, continue amiodarone, metoprolol succinate 12.5 daily  PVCs History of PVC ablation 2019 On amiodarone, metoprolol  C. Difficile Low-grade fevers, elevated Faulkner count Chronic diarrhea, fluid losses, management per medicine service If diarrhea persist may need to hold torsemide  RSV Low-grade fevers with elevated Myricks count Slow recovery, chest congestion, coughing   For questions or updates, please contact [facility] Please consult [email or web address] for contact info under        Signed, Julien Nordmann, MD  03/20/2023, 2:00 PM

## 2023-03-20 NOTE — Progress Notes (Signed)
 Physical Therapy Treatment Patient Details Name: Wendy Sanchez MRN: [id number] DOB: [birth year] Today's Date: 03/20/2023   History of Present Illness Pt is a 73 y.o. female admitted with sepsis, adrenal insufficiency, NSTEMI and recurrent UTI. PMH significant for COPD, T2 compression fx, OA, CHF with EF of 40-45%, depression, obesity, anemia, DVT and PE on Eliquis, temporal arteritis, renal cell carcinoma, adrenal insufficiency, s/p for colostomy secondary to perforated diverticulitis.    PT Comments  Pt making some progress with tolerance to sit<>stands EOB and side stepping along EOB (shuffling) Max A +2.  Tolerance to standing was limited; pt reporting significant pain in feet and knees (esp R knee felt painful and weak).  PT continues to require Mod -Max A +2 with bed mobility demonstrating trunk and LE weakness.  Continued PT will assist pt towards greater standing tolerance, trunk and LE strengthening to increase safety and decrease burden of care with functional mobility.    If plan is discharge home, recommend the following: Two people to help with walking and/or transfers;A lot of help with bathing/dressing/bathroom;Help with stairs or ramp for entrance;Assist for transportation   Can travel by private vehicle     No  Equipment Recommendations  Other (comment) (TBD)    Recommendations for Other Services       Precautions / Restrictions Precautions Precautions: Fall Recall of Precautions/Restrictions: Intact Precaution/Restrictions Comments: monitor HR, BP; R heart cath 03/16/23 Restrictions Weight Bearing Restrictions Per Provider Order: No Other Position/Activity Restrictions: RUE NWB per cardiology "broken wing" protocol s/p intervention     Mobility  Bed Mobility Overal bed mobility: Needs Assistance Bed Mobility: Supine to Sit, Sit to Supine, Rolling Rolling: Max assist, +2 for physical assistance, +2 for safety/equipment   Supine to sit: Max assist, +2 for physical  assistance, +2 for safety/equipment Sit to supine: Max assist, HOB elevated, +2 for physical assistance, +2 for safety/equipment   General bed mobility comments: assistance for LE and trunk support. cues for technique    Transfers Overall transfer level: Needs assistance Equipment used: Rolling walker (2 wheels) Transfers: Sit to/from Stand Sit to Stand: Max assist, +2 physical assistance          Lateral/Scoot Transfers: Mod assist, +2 safety/equipment, Max assist General transfer comment: Sit<> stands EOB side stepping up to [facility], poor tolerance pt reporting significant pain in feet and knees.    Ambulation/Gait Ambulation/Gait assistance: Max assist, +2 physical assistance Gait Distance (Feet): 3 Feet Assistive device: Rolling walker (2 wheels) Gait Pattern/deviations: Shuffle       General Gait Details: side step shuffle 3 ft , pt reported that R LE felt really weak.  SPO2 99%, HR 101bpm during activity.   Stairs             Wheelchair Mobility     Tilt Bed    Modified Rankin (Stroke Patients Only)       Balance Overall balance assessment: Needs assistance Sitting-balance support: Feet supported Sitting balance-Leahy Scale: Fair Sitting balance - Comments: difficulty coming up into a sitting position Max A, but once upright balances at supervision level.   Standing balance support: Bilateral upper extremity supported, Reliant on assistive device for balance Standing balance-Leahy Scale: Poor Standing balance comment: Mod-Max A +2, with RW                            Communication Communication Communication: No apparent difficulties  Cognition Arousal: Alert Behavior During Therapy: WFL for tasks assessed/performed  PT - Cognitive impairments: No apparent impairments                       PT - Cognition Comments: Patient seems motivated to participate Following commands: Intact      Cueing Cueing Techniques: Verbal cues   Exercises Total Joint Exercises Towel Squeeze: AROM, Strengthening, 5 reps Heel Slides: AAROM, Both, 5 reps Hip ABduction/ADduction: AAROM, Both, 5 reps    General Comments        Pertinent Vitals/Pain Pain Assessment Pain Assessment: Faces Faces Pain Scale: Hurts even more Pain Location: all over body; knees with ROM or weightbearing,  bil wrist from IV. Pain Descriptors / Indicators: Aching Pain Intervention(s): Monitored during session, Limited activity within patient's tolerance    Home Living                          Prior Function            PT Goals (current goals can now be found in the care plan section) Acute Rehab PT Goals Patient Stated Goal: to get better PT Goal Formulation: With patient Time For Goal Achievement: 03/23/23 Potential to Achieve Goals: Good Additional Goals Additional Goal #1: Pt will be able to perform bed mobility/transfers with min assist +2 and RW in order to improve functional independence Progress towards PT goals: Progressing toward goals    Frequency    Min 1X/week      PT Plan      Co-evaluation              AM-PAC PT "6 Clicks" Mobility   Outcome Measure  Help needed turning from your back to your side while in a flat bed without using bedrails?: A Lot Help needed moving from lying on your back to sitting on the side of a flat bed without using bedrails?: Total Help needed moving to and from a bed to a chair (including a wheelchair)?: Total Help needed standing up from a chair using your arms (e.g., wheelchair or bedside chair)?: Total Help needed to walk in hospital room?: Total Help needed climbing 3-5 steps with a railing? : Total 6 Click Score: 7    End of Session Equipment Utilized During Treatment: Gait belt Activity Tolerance: Patient limited by pain;Patient limited by fatigue Patient left: in bed;with call bell/phone within reach;with bed alarm set Nurse Communication: Mobility status PT  Visit Diagnosis: Muscle weakness (generalized) (M62.81);Unsteadiness on feet (R26.81);Difficulty in walking, not elsewhere classified (R26.2)     Time: 1610-9604 PT Time Calculation (min) (ACUTE ONLY): 25 min  Charges:    $Therapeutic Activity: 23-37 mins PT General Charges $$ ACUTE PT VISIT: 1 Visit                     Hortencia Conradi, PTA  03/20/23, 2:22 PM

## 2023-03-20 NOTE — Progress Notes (Signed)
 Progress Note   Patient: Wendy Sanchez DOB: 1951/01/11 DOA: 03/07/2023     13 DOS: the patient was seen and examined on 03/20/2023   Brief hospital course: Taken from H&P  Wendy Sanchez is a 73 y.o. female with medical history significant of COPD, CHF with EF of 40-45%, depression, obesity, anemia, DVT and PE on Eliquis, temporal arteritis, renal cell carcinoma, adrenal insufficiency, s/p for colostomy secondary to perforated diverticulitis, who presents with fever, increased urinary frequency.   Patient was found to have hypotension with blood pressure 86/59, which improved to 101/70 after giving 2 L LR bolus in ED, but blood pressure has dropped again to 82/61 with MAP of 69 when I saw pt in ED. Her mental status is normal.  pt was found to have WBC 10.3, GFR> 60, negative PCR for COVID, flu and RSV, UA (hazy appearance, small amount of leukocyte, rare bacteria, WBC 11-20), lactic acid of 1.7 --> 2.2 --> 1.5, troponin 217 --> 251.  Chest x-ray negative for infiltration.  EKG: Sinus rhythm, QTc 484, LAE, LAD, poor R wave progression, low voltage.   2/24: Blood pressure with some improvement to 97/63, patient was started on midodrine and stress doses of steroid.  Elevated leukocytosis today likely secondary to stress doses of steroid.  Preliminary blood cultures negative.  Urine cultures pending.  Troponin peaked at 251 and now trending down.  Family is very concerned about this recurrent UTI, patient has multiple hospitalization for this and has been seen outpatient urology, there were recommending continuing antibiotics at suppressive doses.  2/25: Vital stable.  Blood pressure seems within goal now.  Mild hypokalemia which are being repleted.  Blood cultures remain negative, urine cultures with no growth.  Stopping antibiotics.  Sepsis ruled out.  Increasing the dose of midodrine. Had an episode of sinus tachycardia, restarting home metoprolol and torsemide.  PT is recommending  SNF, not sure if she has any days left  2/26: Vital stable with blood pressure in low 100s.  Echo with EF of 35 to 40%, left ventricular global hypokinesis and severely dilated cavity, grade 1 diastolic dysfunction.  Small pericardial effusion. Patient currently stable to go back to her facility, she needs another insurance authorization which was started today.  She will go back once insurance authorization got approval.  Patient is high risk for readmission and mortality based on underlying comorbidities and poor functional status.  Palliative care was also consulted.  3/5: Patient with new leukocytosis, abdominal pain, watery diarrhea and upper respiratory symptoms.  Respiratory panel was checked and came back positive for RSV.  Patient was also recently started on pyridostigmine which can cause abdominal pain and diarrhea per ID, ordered CT abdomen to rule out any colitis.  Cardiology was also consulted and patient underwent cardiac MRI and right and left cardiac catheterization with following results Cardiac MRI:  1.  Small circumferential pericardial effusion. 2.  Mildly dilated LV with diffuse hypokinesis, EF 30%. 3.  Normal RV size with RV EF 38%. 4. Difficult delayed enhancement images, looked hard to null LV myocardium. Possible small area of mid-wall LGE in the mid inferior wall, nonspecific. 5.  Extracellular volume percentage significantly elevated at 41%.   RHC/LHC: 1. Low filling pressures.  2. Normal cardiac output.  3. 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  4. 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 She was also started on amiodarone for concern of SVT and  PVCs.  3/6: Febrile at 101.4 over the past 24-hour.  Patient with more upper respiratory symptoms.  Continue to have mild abdominal discomfort but mostly pointing towards epigastrium, loose stools in  colostomy bag.  Improving leukocytosis, C. difficile came back positive for antigen and negative for toxin-pending PCR.  GI pathogen panel and UA was also ordered and pending.  CT abdomen was negative for any concern of colitis or diverticulitis.  Chest x-ray normal.  Likely due to RSV  3/7: C. difficile PCR came back positive so started on p.o. vancomycin.  GI pathogen panel is also positive for EPEC-no need to treat per ID. Hemoglobin decreased to 7.7 without any obvious bleeding, FOBT was negative.  Anemia panel with low iron, TIBC was not calculated. Likely anemia of chronic disease with mild iron deficiency. UA with large leukocytes-urine cultures ordered Also developed mild hyponatremia-giving some normal saline  3/8: Overall stable with stable labs, hyponatremia improving.  Urine cultures pending.   Assessment and Plan: Secondary adrenal insufficiency (HCC) Blood pressure currently borderline soft Patient received stress doses of Solu-Cortef Continue home Cortef Increasing the dose of midodrine  Abdominal pain RSV infection And developed  fever with leukocytosis overnight, leukocytosis started improving. Found to have RSV positive, C. difficile antigen and PCR positive, GI pathogen panel for E. Coli, UA with large leukocytes-pending cultures now -CT abdomen negative for diverticulitis or colitis. -Chest x-ray normal -ID was also consulted-started her on p.o. vancomycin -Supportive care  Myocardial injury Troponin peaked at 251 and then started trending down.  No chest pain.  Likely demand ischemia with concern of sepsis. -Echocardiogram EF of 35 to 40%, severe LV dilation,mild LVH, normal RV, mild MR, mild aortic stenosis.  Cardiac catheterization with concern of nonischemic cardiomyopathy -Continue aspirin 81 mg daily -Completed 48 hours of heparin infusion.  Chronic obstructive pulmonary disease (COPD) (HCC) No concern of exacerbation. -Continue home  bronchodilators  Chronic pain syndrome -As needed Tylenol and oxycodone  HFrEF (heart failure with reduced ejection fraction) (HCC)  2D echo on 10/07/2022 showed EF of 40-45%.  Patient has 1+ leg edema, but no worsening shortness breath, does not seem to have CHF exacerbation.  BNP of 193 Repeat echo with EF of 35 to 40%, severe LV dilation,mild LVH, normal RV, mild MR, mild aortic stenosis.  Patient also underwent right and left cardiac catheterization which shows nonischemic cardiomyopathy. 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 -Cardiology is on board and recommending continue low-dose Toprol and spironolactone -Continue amiodarone  History of pulmonary embolus 2020(PE) Restarting home Eliquis  Depression with anxiety -Continue home meds  Obesity (BMI 30-39.9) Estimated body mass index is 39.34 kg/m as calculated from the following:   Height as of this encounter: 5\' 8"  (1.727 m).   Weight as of this encounter: 117.3 kg.   -This will complicate overall prognosis -Encouraged weight loss  Pressure injury of skin Pressure Injury 03/08/23 Ischial tuberosity Left Stage 2 -  Partial thickness loss of dermis presenting as a shallow open injury with a red, pink wound bed without slough. (Active)  03/08/23 1100  Location: Ischial tuberosity  Location Orientation: Left  Staging: Stage 2 -  Partial thickness loss of dermis presenting as a shallow open injury with a red, pink wound bed without slough.  Wound Description (Comments):   Present on Admission: Yes     Pressure Injury 03/08/23 Ischial tuberosity Right Stage 2 -  Partial thickness loss of dermis presenting as a shallow open  injury with a red, pink wound bed without slough. (Active)  03/08/23 1100  Location: Ischial tuberosity  Location Orientation: Right  Staging: Stage 2 -  Partial thickness loss of dermis presenting as a shallow open injury with a red, pink wound bed without  slough.  Wound Description (Comments):   Present on Admission: Yes   Present on admission     Subjective: Patient continued to have some cough and congestion, abdominal pain and diarrhea improving.  Physical Exam: Vitals:   03/20/23 0804 03/20/23 0902 03/20/23 1314 03/20/23 1438  BP:  (!) 89/48 (!) 99/59   Pulse: 84 84 86 86  Resp: 16   16  Temp:  (!) 97.5 F (36.4 C) 99.8 F (37.7 C)   TempSrc:      SpO2: 98% 97% 97% 96%  Weight:      Height:       General.  Frail and obese elderly lady, in no acute distress. Pulmonary.  Lungs clear bilaterally, normal respiratory effort. CV.  Regular rate and rhythm, no JVD, rub or murmur. Abdomen.  Soft, nontender, nondistended, BS positive. CNS.  Alert and oriented .  No focal neurologic deficit. Extremities.  No edema, no cyanosis, pulses intact and symmetrical.   Data Reviewed: Prior data reviewed  Family Communication: Talked with son on phone.  Disposition: Status is: Inpatient Remains inpatient appropriate because: Severity of illness  Planned Discharge Destination: Skilled nursing facility  Time spent: 50 minutes  This record has been created using Conservation officer, historic buildings. Errors have been sought and corrected,but may not always be located. Such creation errors do not reflect on the standard of care.   Author: Arnetha Courser, MD 03/20/2023 3:58 PM  For on call review [email or web address].

## 2023-03-21 DIAGNOSIS — I959 Hypotension, unspecified: Secondary | ICD-10-CM | POA: Diagnosis not present

## 2023-03-21 DIAGNOSIS — I5043 Acute on chronic combined systolic (congestive) and diastolic (congestive) heart failure: Secondary | ICD-10-CM | POA: Diagnosis not present

## 2023-03-21 DIAGNOSIS — A0472 Enterocolitis due to Clostridium difficile, not specified as recurrent: Secondary | ICD-10-CM | POA: Diagnosis present

## 2023-03-21 DIAGNOSIS — I5020 Unspecified systolic (congestive) heart failure: Secondary | ICD-10-CM | POA: Diagnosis not present

## 2023-03-21 DIAGNOSIS — A419 Sepsis, unspecified organism: Secondary | ICD-10-CM | POA: Diagnosis not present

## 2023-03-21 DIAGNOSIS — J210 Acute bronchiolitis due to respiratory syncytial virus: Secondary | ICD-10-CM | POA: Diagnosis present

## 2023-03-21 DIAGNOSIS — E2749 Other adrenocortical insufficiency: Secondary | ICD-10-CM | POA: Diagnosis not present

## 2023-03-21 DIAGNOSIS — I428 Other cardiomyopathies: Secondary | ICD-10-CM | POA: Diagnosis not present

## 2023-03-21 LAB — CBC
HCT: 25.1 % — ABNORMAL LOW (ref 36.0–46.0)
Hemoglobin: 8.2 g/dL — ABNORMAL LOW (ref 12.0–15.0)
MCH: 27.1 pg (ref 26.0–34.0)
MCHC: 32.7 g/dL (ref 30.0–36.0)
MCV: 82.8 fL (ref 80.0–100.0)
Platelets: 663 10*3/uL — ABNORMAL HIGH (ref 150–400)
RBC: 3.03 MIL/uL — ABNORMAL LOW (ref 3.87–5.11)
RDW: 15.8 % — ABNORMAL HIGH (ref 11.5–15.5)
WBC: 10.1 10*3/uL (ref 4.0–10.5)
nRBC: 0 % (ref 0.0–0.2)

## 2023-03-21 LAB — RENAL FUNCTION PANEL
Albumin: <1.5 g/dL — ABNORMAL LOW (ref 3.5–5.0)
Anion gap: 9 (ref 5–15)
BUN: 18 mg/dL (ref 8–23)
CO2: 25 mmol/L (ref 22–32)
Calcium: 7.5 mg/dL — ABNORMAL LOW (ref 8.9–10.3)
Chloride: 99 mmol/L (ref 98–111)
Creatinine, Ser: 0.73 mg/dL (ref 0.44–1.00)
GFR, Estimated: >60 mL/min (ref >60)
Glucose, Bld: 85 mg/dL (ref 70–99)
Phosphorus: 2.4 mg/dL — ABNORMAL LOW (ref 2.5–4.6)
Potassium: 3.4 mmol/L — ABNORMAL LOW (ref 3.5–5.1)
Sodium: 133 mmol/L — ABNORMAL LOW (ref 135–145)

## 2023-03-21 LAB — MAGNESIUM: Magnesium: 1.9 mg/dL (ref 1.7–2.4)

## 2023-03-21 MED ORDER — K PHOS MONO-SOD PHOS DI & MONO 155-852-130 MG PO TABS
250.0000 mg | ORAL_TABLET | Freq: Two times a day (BID) | ORAL | Status: AC
  Administered 2023-03-21 (×2): 250 mg via ORAL
  Filled 2023-03-21 (×2): qty 1

## 2023-03-21 MED ORDER — ALBUMIN HUMAN 25 % IV SOLN
50.0000 g | Freq: Every day | INTRAVENOUS | Status: AC
  Administered 2023-03-21: 50 g via INTRAVENOUS
  Administered 2023-03-22: 25 g via INTRAVENOUS
  Administered 2023-03-23: 50 g via INTRAVENOUS
  Filled 2023-03-21 (×4): qty 200

## 2023-03-21 MED ORDER — IPRATROPIUM-ALBUTEROL 0.5-2.5 (3) MG/3ML IN SOLN
3.0000 mL | Freq: Two times a day (BID) | RESPIRATORY_TRACT | Status: DC
  Administered 2023-03-21 – 2023-03-22 (×2): 3 mL via RESPIRATORY_TRACT
  Filled 2023-03-21 (×2): qty 3

## 2023-03-21 MED ORDER — GUAIFENESIN-DM 100-10 MG/5ML PO SYRP
5.0000 mL | ORAL_SOLUTION | ORAL | Status: DC | PRN
Start: 2023-03-21 — End: 2023-03-27
  Administered 2023-03-21 – 2023-03-26 (×5): 5 mL via ORAL
  Filled 2023-03-21 (×5): qty 10

## 2023-03-21 NOTE — Progress Notes (Signed)
 Progress Note   Patient: Wendy Sanchez VOZ:[id number] DOB: [birth year] DOA: 03/07/2023     14 DOS: the patient was seen and examined on 03/21/2023   Brief hospital course: Taken from H&P  Wendy Sanchez is a 73 y.o. female with medical history significant of COPD, CHF with EF of 40-45%, depression, obesity, anemia, DVT and PE on Eliquis, temporal arteritis, renal cell carcinoma, adrenal insufficiency, s/p for colostomy secondary to perforated diverticulitis, who presents with fever, increased urinary frequency.   Patient was found to have hypotension with blood pressure 86/59, which improved to 101/70 after giving 2 L LR bolus in ED, but blood pressure has dropped again to 82/61 with MAP of 69 when I saw pt in ED. Her mental status is normal.  pt was found to have WBC 10.3, GFR> 60, negative PCR for COVID, flu and RSV, UA (hazy appearance, small amount of leukocyte, rare bacteria, WBC 11-20), lactic acid of 1.7 --> 2.2 --> 1.5, troponin 217 --> 251.  Chest x-ray negative for infiltration.  EKG: Sinus rhythm, QTc 484, LAE, LAD, poor R wave progression, low voltage.   2/24: Blood pressure with some improvement to 97/63, patient was started on midodrine and stress doses of steroid.  Elevated leukocytosis today likely secondary to stress doses of steroid.  Preliminary blood cultures negative.  Urine cultures pending.  Troponin peaked at 251 and now trending down.  Family is very concerned about this recurrent UTI, patient has multiple hospitalization for this and has been seen outpatient urology, there were recommending continuing antibiotics at suppressive doses.  2/25: Vital stable.  Blood pressure seems within goal now.  Mild hypokalemia which are being repleted.  Blood cultures remain negative, urine cultures with no growth.  Stopping antibiotics.  Sepsis ruled out.  Increasing the dose of midodrine. Had an episode of sinus tachycardia, restarting home metoprolol and torsemide.  PT is recommending  SNF, not sure if she has any days left  2/26: Vital stable with blood pressure in low 100s.  Echo with EF of 35 to 40%, left ventricular global hypokinesis and severely dilated cavity, grade 1 diastolic dysfunction.  Small pericardial effusion. Patient currently stable to go back to her facility, she needs another insurance authorization which was started today.  She will go back once insurance authorization got approval.  Patient is high risk for readmission and mortality based on underlying comorbidities and poor functional status.  Palliative care was also consulted.  3/5: Patient with new leukocytosis, abdominal pain, watery diarrhea and upper respiratory symptoms.  Respiratory panel was checked and came back positive for RSV.  Patient was also recently started on pyridostigmine which can cause abdominal pain and diarrhea per ID, ordered CT abdomen to rule out any colitis.  Cardiology was also consulted and patient underwent cardiac MRI and right and left cardiac catheterization with following results Cardiac MRI:  1.  Small circumferential pericardial effusion. 2.  Mildly dilated LV with diffuse hypokinesis, EF 30%. 3.  Normal RV size with RV EF 38%. 4. Difficult delayed enhancement images, looked hard to null LV myocardium. Possible small area of mid-wall LGE in the mid inferior wall, nonspecific. 5.  Extracellular volume percentage significantly elevated at 41%.   RHC/LHC: 1. Low filling pressures.  2. Normal cardiac output.  3. 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  4. 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 She was also started on amiodarone for concern of SVT and  PVCs.  3/6: Febrile at 101.4 over the past 24-hour.  Patient with more upper respiratory symptoms.  Continue to have mild abdominal discomfort but mostly pointing towards epigastrium, loose stools in  colostomy bag.  Improving leukocytosis, C. difficile came back positive for antigen and negative for toxin-pending PCR.  GI pathogen panel and UA was also ordered and pending.  CT abdomen was negative for any concern of colitis or diverticulitis.  Chest x-ray normal.  Likely due to RSV  3/7: C. difficile PCR came back positive so started on p.o. vancomycin.  GI pathogen panel is also positive for EPEC-no need to treat per ID. Hemoglobin decreased to 7.7 without any obvious bleeding, FOBT was negative.  Anemia panel with low iron, TIBC was not calculated. Likely anemia of chronic disease with mild iron deficiency. UA with large leukocytes-urine cultures ordered Also developed mild hyponatremia-giving some normal saline  3/8: Overall stable with stable labs, hyponatremia improving.  Urine cultures pending.  3/9: Slowly improving respiratory symptoms.  Abdominal pain has been resolved with improving diarrhea.  Persistently low albumin so started on albumin infusion for 3 days after discussing with cardiology   Assessment and Plan: Secondary adrenal insufficiency (HCC) Blood pressure currently borderline soft Patient received stress doses of Solu-Cortef Continue home Cortef Increasing the dose of midodrine  Abdominal pain RSV infection And developed  fever with leukocytosis overnight, leukocytosis started improving. Found to have RSV positive, C. difficile antigen and PCR positive, GI pathogen panel for E. Coli, UA with large leukocytes-pending cultures now -CT abdomen negative for diverticulitis or colitis. -Chest x-ray normal -ID was also consulted-started her on p.o. vancomycin -Supportive care  Myocardial injury Troponin peaked at 251 and then started trending down.  No chest pain.  Likely demand ischemia with concern of sepsis. -Echocardiogram EF of 35 to 40%, severe LV dilation,mild LVH, normal RV, mild MR, mild aortic stenosis.  Cardiac catheterization with concern of nonischemic  cardiomyopathy -Continue aspirin 81 mg daily -Completed 48 hours of heparin infusion.  Chronic obstructive pulmonary disease (COPD) (HCC) No concern of exacerbation. -Continue home bronchodilators  Chronic pain syndrome -As needed Tylenol and oxycodone  HFrEF (heart failure with reduced ejection fraction) (HCC)  2D echo on 10/07/2022 showed EF of 40-45%.  Patient has 1+ leg edema, but no worsening shortness breath, does not seem to have CHF exacerbation.  BNP of 193 Repeat echo with EF of 35 to 40%, severe LV dilation,mild LVH, normal RV, mild MR, mild aortic stenosis.  Patient also underwent right and left cardiac catheterization which shows nonischemic cardiomyopathy. 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 -Cardiology is on board and recommending continue low-dose Toprol and spironolactone -Continue amiodarone  History of pulmonary embolus 2020(PE) Restarting home Eliquis  Depression with anxiety -Continue home meds  Obesity (BMI 30-39.9) Estimated body mass index is 39.34 kg/m as calculated from the following:   Height as of this encounter: 5\' 8"  (1.727 m).   Weight as of this encounter: 117.3 kg.   -This will complicate overall prognosis -Encouraged weight loss  Pressure injury of skin Pressure Injury 03/08/23 Ischial tuberosity Left Stage 2 -  Partial thickness loss of dermis presenting as a shallow open injury with a red, pink wound bed without slough. (Active)  03/08/23 1100  Location: Ischial tuberosity  Location Orientation: Left  Staging: Stage 2 -  Partial thickness loss of dermis presenting as a shallow open injury with a red, pink wound bed without slough.  Wound Description (  Comments):   Present on Admission: Yes     Pressure Injury 03/08/23 Ischial tuberosity Right Stage 2 -  Partial thickness loss of dermis presenting as a shallow open injury with a red, pink wound bed without slough. (Active)  03/08/23 1100   Location: Ischial tuberosity  Location Orientation: Right  Staging: Stage 2 -  Partial thickness loss of dermis presenting as a shallow open injury with a red, pink wound bed without slough.  Wound Description (Comments):   Present on Admission: Yes   Present on admission     Subjective: Patient was seen and examined today.  She seems improving.  Less congestion.  Still having intermittent wheeze.  Physical Exam: Vitals:   03/21/23 0641 03/21/23 0757 03/21/23 0833 03/21/23 1234  BP: 93/61  (!) 94/56 98/64  Pulse:   92 88  Resp:      Temp:   98.4 F (36.9 C) 97.7 F (36.5 C)  TempSrc:      SpO2:  98% 98% 99%  Weight:      Height:       General.  Frail and obese lady, in no acute distress. Pulmonary.  Lungs clear bilaterally, normal respiratory effort. CV.  Regular rate and rhythm, no JVD, rub or murmur. Abdomen.  Soft, nontender, nondistended, BS positive. CNS.  Alert and oriented .  No focal neurologic deficit. Extremities.  1+ LE edema, lower extremities wrapped   Data Reviewed: Prior data reviewed  Family Communication:   Disposition: Status is: Inpatient Remains inpatient appropriate because: Severity of illness  Planned Discharge Destination: Skilled nursing facility  Time spent: 50 minutes  This record has been created using Conservation officer, historic buildings. Errors have been sought and corrected,but may not always be located. Such creation errors do not reflect on the standard of care.   Author: Arnetha Courser, MD 03/21/2023 3:26 PM  For on call review [email or web address].

## 2023-03-21 NOTE — Plan of Care (Signed)

## 2023-03-21 NOTE — Plan of Care (Signed)

## 2023-03-21 NOTE — Progress Notes (Signed)
 Rounding Note    Patient Name: Wendy Sanchez Date of Encounter: 03/21/2023  [facility] Cardiologist: Julien Nordmann, MD   Subjective   Feels about the same as yesterday Reports having continued high diarrhea output Cough, chest congestion wheezing Difficulty getting sputum out, felt that she was choking this morning Blood pressure low, asymptomatic, spending most of her time in bed  Inpatient Medications    Scheduled Meds:  amiodarone  200 mg Oral Daily   apixaban  5 mg Oral BID   ascorbic acid  500 mg Oral Daily   calcium carbonate  2 tablet Oral Q breakfast   cholecalciferol  1,000 Units Oral Daily   cyanocobalamin  1,000 mcg Oral Daily   famotidine  20 mg Oral BID WC   ferrous sulfate  325 mg Oral BID WC   fluticasone  1 spray Each Nare Daily   folic acid  1 mg Oral Daily   gabapentin  300 mg Oral TID   hydrocortisone  15 mg Oral Q breakfast   hydrocortisone  5 mg Oral BID AC   ipratropium-albuterol  3 mL Nebulization BID   lactose free nutrition  237 mL Oral TID BM   loratadine  10 mg Oral QPM   metoprolol succinate  12.5 mg Oral Daily   midodrine  15 mg Oral TID WC   montelukast  10 mg Oral Daily   nortriptyline  50 mg Oral QHS   pantoprazole  40 mg Oral Q breakfast   phosphorus  250 mg Oral BID   polyvinyl alcohol  1 drop Both Eyes TID   pyridostigmine  30 mg Oral BID   scopolamine  1 patch Transdermal Q72H   sodium chloride flush  3 mL Intravenous Q12H   spironolactone  12.5 mg Oral Daily   thiamine  100 mg Oral Daily   torsemide  20 mg Oral Daily   vancomycin  125 mg Oral QID   vitamin A  10,000 Units Oral Daily   Continuous Infusions:  albumin human 50 g (03/21/23 1121)   PRN Meds: oxyCODONE **AND** acetaminophen, acetaminophen, acetaminophen, albuterol, diphenhydrAMINE, docusate sodium, guaiFENesin-dextromethorphan, liver oil-zinc oxide, melatonin, ondansetron (ZOFRAN) IV, ondansetron (ZOFRAN) IV, phenol, sodium chloride flush   Vital  Signs    Vitals:   03/21/23 0641 03/21/23 0757 03/21/23 0833 03/21/23 1234  BP: 93/61  (!) 94/56 98/64  Pulse:   92 88  Resp:      Temp:   98.4 F (36.9 C) 97.7 F (36.5 C)  TempSrc:      SpO2:  98% 98% 99%  Weight:      Height:        Intake/Output Summary (Last 24 hours) at 03/21/2023 1346 Last data filed at 03/21/2023 1100 Gross per 24 hour  Intake 477 ml  Output 1850 ml  Net -1373 ml      03/21/2023    4:40 AM 03/20/2023    5:00 AM 03/19/2023    5:08 AM  Last 3 Weights  Weight (lbs) 263 lb 3.7 oz 260 lb 9.3 oz 258 lb 13.1 oz  Weight (kg) 119.4 kg 118.2 kg 117.4 kg      Telemetry    Telemetry personally Reviewed  ECG     - Personally Reviewed  Physical Exam   Constitutional:  oriented to person, place, and time. No distress.  HENT:  Head: Grossly normal Eyes:  no discharge. No scleral icterus.  Neck: No JVD, no carotid bruits  Cardiovascular: Regular rate and  rhythm, no murmurs appreciated Pulmonary/Chest: Wheezing with Rales Abdominal: Soft.  no distension.  no tenderness.  Musculoskeletal: Normal range of motion Neurological:  normal muscle tone. Coordination normal. No atrophy Skin: Skin warm and dry Psychiatric: normal affect, pleasant   Labs    High Sensitivity Troponin:   Recent Labs  Lab 03/07/23 2251 03/07/23 2353 03/08/23 0341 03/08/23 0515 03/08/23 1054  TROPONINIHS 251* 241* 150* 113* 61*     Chemistry Recent Labs  Lab 03/15/23 0520 03/16/23 0758 03/19/23 0547 03/20/23 0434 03/21/23 0445  NA 135   < > 130* 133* 133*  K 3.9   < > 3.7 3.5 3.4*  CL 99   < > 94* 100 99  CO2 30   < > 29 26 25   GLUCOSE 87   < > 77 83 85  BUN 19   < > 18 18 18   CREATININE 0.58   < > 0.63 0.78 0.73  CALCIUM 8.1*   < > 7.7* 7.4* 7.5*  MG 1.9  --   --   --  1.9  ALBUMIN  --    < > <1.5* <1.5* <1.5*  GFRNONAA >60   < > >60 >60 >60  ANIONGAP 6   < > 7 7 9    < > = values in this interval not displayed.    Lipids No results for input(s): "CHOL", "TRIG",  "HDL", "LABVLDL", "LDLCALC", "CHOLHDL" in the last 168 hours.  Hematology Recent Labs  Lab 03/19/23 0547 03/20/23 0434 03/21/23 0445  WBC 9.2 8.8 10.1  RBC 2.77*  2.75* 2.78* 3.03*  HGB 7.7* 7.8* 8.2*  HCT 23.7* 23.7* 25.1*  MCV 85.6 85.3 82.8  MCH 27.8 28.1 27.1  MCHC 32.5 32.9 32.7  RDW 15.7* 15.4 15.8*  PLT 615* 621* 663*   Thyroid No results for input(s): "TSH", "FREET4" in the last 168 hours.  BNPNo results for input(s): "BNP", "PROBNP" in the last 168 hours.  DDimer No results for input(s): "DDIMER" in the last 168 hours.   Radiology    No results found.  Cardiac Studies     Patient Profile     73 y.o. 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 difficile positive  Assessment & Plan    Cardiomyopathy/chronic systolic CHF Ejection fraction 35 to 40%, severe LV dilation Blood pressure low but stable on midodrine 15 mg 3 times daily GDMT including metoprolol succinate 12.5 daily, spironolactone 12.5 daily, torsemide 20 daily Stable renal function, off IV fluids Would hold torsemide for worsening renal function for symptomatic hypotension  S VT Off telemetry, continue amiodarone, metoprolol succinate 12.5 daily  PVCs History of PVC ablation 2019 On amiodarone, metoprolol succinate 12.5 daily  C. Difficile Low-grade fevers, elevated Wolken count Reports several episodes of diarrhea daily Less abdominal pain  RSV Improvement in low-grade fevers with elevated Gravois count Still with cough, congestion Mucinex, nebs   For questions or updates, please contact [facility] Please consult [email or web address] for contact info under        Signed, Julien Nordmann, MD  03/21/2023, 1:46 PM

## 2023-03-22 ENCOUNTER — Encounter: Payer: Medicare Other | Admitting: Family

## 2023-03-22 DIAGNOSIS — I5023 Acute on chronic systolic (congestive) heart failure: Secondary | ICD-10-CM | POA: Diagnosis not present

## 2023-03-22 DIAGNOSIS — J210 Acute bronchiolitis due to respiratory syncytial virus: Secondary | ICD-10-CM

## 2023-03-22 DIAGNOSIS — A0472 Enterocolitis due to Clostridium difficile, not specified as recurrent: Secondary | ICD-10-CM

## 2023-03-22 DIAGNOSIS — A419 Sepsis, unspecified organism: Secondary | ICD-10-CM | POA: Diagnosis not present

## 2023-03-22 DIAGNOSIS — I5020 Unspecified systolic (congestive) heart failure: Secondary | ICD-10-CM | POA: Diagnosis not present

## 2023-03-22 DIAGNOSIS — E2749 Other adrenocortical insufficiency: Secondary | ICD-10-CM | POA: Diagnosis not present

## 2023-03-22 DIAGNOSIS — J432 Centrilobular emphysema: Secondary | ICD-10-CM | POA: Diagnosis not present

## 2023-03-22 LAB — PHOSPHORUS: Phosphorus: 2.6 mg/dL (ref 2.5–4.6)

## 2023-03-22 LAB — URINE CULTURE

## 2023-03-22 LAB — MAGNESIUM: Magnesium: 1.9 mg/dL (ref 1.7–2.4)

## 2023-03-22 LAB — RENAL FUNCTION PANEL
Albumin: 1.9 g/dL — ABNORMAL LOW (ref 3.5–5.0)
Anion gap: 8 (ref 5–15)
BUN: 16 mg/dL (ref 8–23)
CO2: 28 mmol/L (ref 22–32)
Calcium: 7.5 mg/dL — ABNORMAL LOW (ref 8.9–10.3)
Chloride: 102 mmol/L (ref 98–111)
Creatinine, Ser: 0.68 mg/dL (ref 0.44–1.00)
GFR, Estimated: >60 mL/min (ref >60)
Glucose, Bld: 82 mg/dL (ref 70–99)
Phosphorus: 2.5 mg/dL (ref 2.5–4.6)
Potassium: 3.3 mmol/L — ABNORMAL LOW (ref 3.5–5.1)
Sodium: 138 mmol/L (ref 135–145)

## 2023-03-22 LAB — MRSA NEXT GEN BY PCR, NASAL: MRSA by PCR Next Gen: NOT DETECTED

## 2023-03-22 LAB — CULTURE, BLOOD (ROUTINE X 2)
Culture: NO GROWTH
Culture: NO GROWTH
Special Requests: ADEQUATE
Special Requests: ADEQUATE

## 2023-03-22 MED ORDER — COPPER 2 MG PO TABS
2.0000 mg | Freq: Every day | Status: DC
  Administered 2023-03-22 – 2023-03-27 (×6): 2 mg via ORAL
  Filled 2023-03-22 (×6): qty 1

## 2023-03-22 MED ORDER — ADULT MULTIVITAMIN W/MINERALS CH
1.0000 | ORAL_TABLET | Freq: Every day | ORAL | Status: DC
  Administered 2023-03-22 – 2023-03-27 (×6): 1 via ORAL
  Filled 2023-03-22 (×6): qty 1

## 2023-03-22 MED ORDER — GLUCERNA SHAKE PO LIQD
237.0000 mL | Freq: Two times a day (BID) | ORAL | Status: DC
Start: 2023-03-22 — End: 2023-03-27
  Administered 2023-03-23 – 2023-03-25 (×6): 237 mL via ORAL

## 2023-03-22 MED ORDER — ENSURE MAX PROTEIN PO LIQD
11.0000 [oz_av] | Freq: Every day | ORAL | Status: DC
  Filled 2023-03-22: qty 330

## 2023-03-22 MED ORDER — POTASSIUM CHLORIDE 20 MEQ PO PACK: 40.0000 meq | PACK | Freq: Once | ORAL | Status: DC

## 2023-03-22 MED ORDER — ZINC SULFATE 220 (50 ZN) MG PO CAPS
220.0000 mg | ORAL_CAPSULE | Freq: Every day | ORAL | Status: DC
  Administered 2023-03-22 – 2023-03-27 (×6): 220 mg via ORAL
  Filled 2023-03-22 (×6): qty 1

## 2023-03-22 MED ORDER — POTASSIUM CHLORIDE CRYS ER 20 MEQ PO TBCR
40.0000 meq | EXTENDED_RELEASE_TABLET | Freq: Once | ORAL | Status: AC
  Administered 2023-03-22: 40 meq via ORAL
  Filled 2023-03-22: qty 2

## 2023-03-22 NOTE — Progress Notes (Signed)
 Progress Note   Patient: Wendy Sanchez ZOX:[id number] DOB: Nov 07, 1950 DOA: 03/07/2023     15 DOS: the patient was seen and examined on 03/22/2023   Brief hospital course: Taken from H&P  Wendy Sanchez is a 73 y.o. female with medical history significant of COPD, CHF with EF of 40-45%, depression, obesity, anemia, DVT and PE on Eliquis, temporal arteritis, renal cell carcinoma, adrenal insufficiency, s/p for colostomy secondary to perforated diverticulitis, who presents with fever, increased urinary frequency.   Patient was found to have hypotension with blood pressure 86/59, which improved to 101/70 after giving 2 L LR bolus in ED, but blood pressure has dropped again to 82/61 with MAP of 69 when I saw pt in ED. Her mental status is normal.  pt was found to have WBC 10.3, GFR> 60, negative PCR for COVID, flu and RSV, UA (hazy appearance, small amount of leukocyte, rare bacteria, WBC 11-20), lactic acid of 1.7 --> 2.2 --> 1.5, troponin 217 --> 251.  Chest x-ray negative for infiltration.  EKG: Sinus rhythm, QTc 484, LAE, LAD, poor R wave progression, low voltage.   2/24: Blood pressure with some improvement to 97/63, patient was started on midodrine and stress doses of steroid.  Elevated leukocytosis today likely secondary to stress doses of steroid.  Preliminary blood cultures negative.  Urine cultures pending.  Troponin peaked at 251 and now trending down.  Family is very concerned about this recurrent UTI, patient has multiple hospitalization for this and has been seen outpatient urology, there were recommending continuing antibiotics at suppressive doses.  2/25: Vital stable.  Blood pressure seems within goal now.  Mild hypokalemia which are being repleted.  Blood cultures remain negative, urine cultures with no growth.  Stopping antibiotics.  Sepsis ruled out.  Increasing the dose of midodrine. Had an episode of sinus tachycardia, restarting home metoprolol and torsemide.  PT is  recommending SNF, not sure if she has any days left  2/26: Vital stable with blood pressure in low 100s.  Echo with EF of 35 to 40%, left ventricular global hypokinesis and severely dilated cavity, grade 1 diastolic dysfunction.  Small pericardial effusion. Patient currently stable to go back to her facility, she needs another insurance authorization which was started today.  She will go back once insurance authorization got approval.  Patient is high risk for readmission and mortality based on underlying comorbidities and poor functional status.  Palliative care was also consulted.  3/5: Patient with new leukocytosis, abdominal pain, watery diarrhea and upper respiratory symptoms.  Respiratory panel was checked and came back positive for RSV.  Patient was also recently started on pyridostigmine which can cause abdominal pain and diarrhea per ID, ordered CT abdomen to rule out any colitis.  Cardiology was also consulted and patient underwent cardiac MRI and right and left cardiac catheterization with following results Cardiac MRI:  1.  Small circumferential pericardial effusion. 2.  Mildly dilated LV with diffuse hypokinesis, EF 30%. 3.  Normal RV size with RV EF 38%. 4. Difficult delayed enhancement images, looked hard to null LV myocardium. Possible small area of mid-wall LGE in the mid inferior wall, nonspecific. 5.  Extracellular volume percentage significantly elevated at 41%.   RHC/LHC: 1. Low filling pressures.  2. Normal cardiac output.  3. 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  4. 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 She was also started on amiodarone for concern of SVT and  PVCs.  3/6: Febrile at 101.4 over the past 24-hour.  Patient with more upper respiratory symptoms.  Continue to have mild abdominal discomfort but mostly pointing towards epigastrium, loose  stools in colostomy bag.  Improving leukocytosis, C. difficile came back positive for antigen and negative for toxin-pending PCR.  GI pathogen panel and UA was also ordered and pending.  CT abdomen was negative for any concern of colitis or diverticulitis.  Chest x-ray normal.  Likely due to RSV  3/7: C. difficile PCR came back positive so started on p.o. vancomycin.  GI pathogen panel is also positive for EPEC-no need to treat per ID. Hemoglobin decreased to 7.7 without any obvious bleeding, FOBT was negative.  Anemia panel with low iron, TIBC was not calculated. Likely anemia of chronic disease with mild iron deficiency. UA with large leukocytes-urine cultures ordered Also developed mild hyponatremia-giving some normal saline  3/8: Overall stable with stable labs, hyponatremia improving.  Urine cultures pending.  3/9: Slowly improving respiratory symptoms.  Abdominal pain has been resolved with improving diarrhea.  Persistently low albumin so started on albumin infusion for 3 days after discussing with cardiology.  3/10: Remained hemodynamically stable, albumin at 1.9 and potassium of 3.3 today.  Pending insurance authorization for rehab.  Urine cultures with multiple species.   Assessment and Plan: Secondary adrenal insufficiency (HCC) Blood pressure currently borderline soft Patient received stress doses of Solu-Cortef Continue home Cortef Increasing the dose of midodrine  Abdominal pain RSV infection And developed  fever with leukocytosis overnight, leukocytosis started improving. Found to have RSV positive, C. difficile antigen and PCR positive, GI pathogen panel for E. Coli, UA with large leukocytes-pending cultures now -CT abdomen negative for diverticulitis or colitis. -Chest x-ray normal -ID was also consulted-started her on p.o. vancomycin -Supportive care  Myocardial injury Troponin peaked at 251 and then started trending down.  No chest pain.  Likely demand ischemia with  concern of sepsis. -Echocardiogram EF of 35 to 40%, severe LV dilation,mild LVH, normal RV, mild MR, mild aortic stenosis.  Cardiac catheterization with concern of nonischemic cardiomyopathy -Continue aspirin 81 mg daily -Completed 48 hours of heparin infusion.  Chronic obstructive pulmonary disease (COPD) (HCC) No concern of exacerbation. -Continue home bronchodilators  Chronic pain syndrome -As needed Tylenol and oxycodone  HFrEF (heart failure with reduced ejection fraction) (HCC)  2D echo on 10/07/2022 showed EF of 40-45%.  Patient has 1+ leg edema, but no worsening shortness breath, does not seem to have CHF exacerbation.  BNP of 193 Repeat echo with EF of 35 to 40%, severe LV dilation,mild LVH, normal RV, mild MR, mild aortic stenosis.  Patient also underwent right and left cardiac catheterization which shows nonischemic cardiomyopathy. 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 -Cardiology is on board and recommending continue low-dose Toprol and spironolactone -Continue amiodarone  History of pulmonary embolus 2020(PE) Restarting home Eliquis  Depression with anxiety -Continue home meds  Obesity (BMI 30-39.9) Estimated body mass index is 39.34 kg/m as calculated from the following:   Height as of this encounter: 5\' 8"  (1.727 m).   Weight as of this encounter: 117.3 kg.   -This will complicate overall prognosis -Encouraged weight loss  Pressure injury of skin Pressure Injury 03/08/23 Ischial tuberosity Left Stage 2 -  Partial thickness loss of dermis presenting as a shallow open injury with a red, pink wound bed without slough. (Active)  03/08/23 1100  Location: Ischial tuberosity  Location Orientation: Left  Staging: Stage  2 -  Partial thickness loss of dermis presenting as a shallow open injury with a red, pink wound bed without slough.  Wound Description (Comments):   Present on Admission: Yes     Pressure Injury  03/08/23 Ischial tuberosity Right Stage 2 -  Partial thickness loss of dermis presenting as a shallow open injury with a red, pink wound bed without slough. (Active)  03/08/23 1100  Location: Ischial tuberosity  Location Orientation: Right  Staging: Stage 2 -  Partial thickness loss of dermis presenting as a shallow open injury with a red, pink wound bed without slough.  Wound Description (Comments):   Present on Admission: Yes   Present on admission     Subjective: Patient was complaining of nonspecific aches and pains.  Upper respiratory symptoms improving.  Abdominal pain and diarrhea improved.  Physical Exam: Vitals:   03/22/23 0600 03/22/23 0737 03/22/23 0744 03/22/23 1111  BP:  (!) 97/56  (!) 86/59  Pulse:  79  (!) 103  Resp:  16  18  Temp:  98.1 F (36.7 C)  98.4 F (36.9 C)  TempSrc:  Oral  Oral  SpO2:  97% 97% 93%  Weight: 119.2 kg     Height:       General.  Frail and obese lady, in no acute distress. Pulmonary.  Lungs clear bilaterally, normal respiratory effort. CV.  Regular rate and rhythm, no JVD, rub or murmur. Abdomen.  Soft, nontender, nondistended, BS positive. CNS.  Alert and oriented .  No focal neurologic deficit. Extremities.  Trace LE edema, no cyanosis, pulses intact and symmetrical.  Lower extremities wrapped  Data Reviewed: Prior data reviewed  Family Communication:   Disposition: Status is: Inpatient Remains inpatient appropriate because: Severity of illness  Planned Discharge Destination: Skilled nursing facility  Time spent: 45 minutes  This record has been created using Conservation officer, historic buildings. Errors have been sought and corrected,but may not always be located. Such creation errors do not reflect on the standard of care.   Author: Arnetha Courser, MD 03/22/2023 2:29 PM  For on call review [email or web address].

## 2023-03-22 NOTE — Plan of Care (Signed)

## 2023-03-22 NOTE — Evaluation (Signed)
 Occupational Therapy Re-Evaluation Patient Details Name: Wendy Sanchez MRN: [id number] DOB: [birth year] Today's Date: 03/22/2023   History of Present Illness   Pt is a 73 y.o. female admitted with sepsis, adrenal insufficiency, NSTEMI and recurrent UTI. PMH significant for COPD, T2 compression fx, OA, CHF with EF of 40-45%, depression, obesity, anemia, DVT and PE on Eliquis, temporal arteritis, renal cell carcinoma, adrenal insufficiency, s/p for colostomy secondary to perforated diverticulitis.     Clinical Impressions Upon entering the room, nursing present to change colostomy bag. Pt agreeable to bathing and linen change. Pt able to wash UB from bed level with set up A. She does have some pain with shoulder elevation. Pt rolling L <> R with max A of 2 for LB hygiene and peri care. Pt does fatigue quickly during session and linen changed during bed mobility. Pt's supporting sister present in room as well. NT remains in room to assist pt further as therapist exits the room. Pt making progress towards OT goals. Continue OT POC.     If plan is discharge home, recommend the following:   Two people to help with walking and/or transfers;Two people to help with bathing/dressing/bathroom;Assistance with cooking/housework      Equipment Recommendations   Other (comment) (defer to next venue of care)      Precautions/Restrictions   Precautions Precautions: Fall     Mobility Bed Mobility Overal bed mobility: Needs Assistance Bed Mobility: Rolling Rolling: Max assist, Total assist, +2 for physical assistance              Transfers                              ADL either performed or assessed with clinical judgement   ADL Overall ADL's : Needs assistance/impaired         Upper Body Bathing: Minimal assistance;Bed level   Lower Body Bathing: Maximal assistance;Bed level                               Vision Patient Visual Report: No change  from baseline              Pertinent Vitals/Pain Pain Assessment Pain Assessment: Faces Faces Pain Scale: Hurts even more Pain Location: B Knees with ROM and B UEs with shoulder flexion Pain Descriptors / Indicators: Aching, Discomfort Pain Intervention(s): Limited activity within patient's tolerance, Repositioned, Monitored during session     Extremity/Trunk Assessment Upper Extremity Assessment Upper Extremity Assessment: Generalized weakness   Lower Extremity Assessment Lower Extremity Assessment: Generalized weakness       Communication     Cognition Arousal: Alert Behavior During Therapy: WFL for tasks assessed/performed Cognition: No apparent impairments                                           OT Goals(Current goals can be found in the care plan section)   Acute Rehab OT Goals Time For Goal Achievement: 04/19/23   OT Frequency:  Min 1X/week    Co-evaluation              AM-PAC OT "6 Clicks" Daily Activity     Outcome Measure Help from another person eating meals?: None Help from another person taking care of personal grooming?: A Little Help from  another person toileting, which includes using toliet, bedpan, or urinal?: Total Help from another person bathing (including washing, rinsing, drying)?: Total Help from another person to put on and taking off regular upper body clothing?: A Lot Help from another person to put on and taking off regular lower body clothing?: Total 6 Click Score: 12   End of Session Nurse Communication: Mobility status  Activity Tolerance: Patient limited by fatigue Patient left: with call bell/phone within reach;in bed;with bed alarm set;with nursing/sitter in room;with family/visitor present  OT Visit Diagnosis: Muscle weakness (generalized) (M62.81);Other abnormalities of gait and mobility (R26.89);Unsteadiness on feet (R26.81)                Time: 1478-2956 OT Time Calculation (min): 34  min Charges:  OT General Charges $OT Visit: 1 Visit OT Evaluation $OT Re-eval: 1 Re-eval OT Treatments $Self Care/Home Management : 23-37 mins  Jackquline Denmark, MS, OTR/L , CBIS ascom [phone]  03/22/23, 4:24 PM

## 2023-03-22 NOTE — Progress Notes (Addendum)
 Patient ID: Wendy Sanchez, female   DOB: 10-09-1950, 73 y.o.   MRN: [id number]     Advanced Heart Failure Rounding Note  Cardiologist: Julien Nordmann, MD  Chief Complaint: CHF Subjective:    GI panel + e coli. Also C. Diff +. Now on vanc. WBC WNL. Afebrile. Abd discomfort improving.   On home Torsemide. Weight stable. -1.3L UOP.   Denies CP/SOB. Has not been able to get up much d/t weakness. Only able to sit on EOB with assistance.   Objective:   Cardiac MRI:  1.  Small circumferential pericardial effusion. 2.  Mildly dilated LV with diffuse hypokinesis, EF 30%. 3.  Normal RV size with RV EF 38%. 4. Difficult delayed enhancement images, looked hard to null LV myocardium. Possible small area of mid-wall LGE in the mid inferior wall, nonspecific. 5.  Extracellular volume percentage significantly elevated at 41%.  RHC/LHC: 1. Low filling pressures.  2. Normal cardiac output.  3. 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  4. Mild aortic stenosis, peak-to-peak gradient 18 mmHg  Weight Range: 119.2 kg Body mass index is 39.96 kg/m.   Vital Signs:   Temp:  [97.5 F (36.4 C)-98.5 F (36.9 C)] 98.5 F (36.9 C) (03/10 0442) Pulse Rate:  [83-92] 86 (03/10 0442) Resp:  [16-20] 18 (03/10 0442) BP: (90-105)/(56-64) 90/56 (03/10 0442) SpO2:  [97 %-100 %] 97 % (03/10 0442) Weight:  [119.2 kg] 119.2 kg (03/10 0600) Last BM Date : 03/20/23  Weight change: Filed Weights   03/20/23 0500 03/21/23 0440 03/22/23 0600  Weight: 118.2 kg 119.4 kg 119.2 kg    Intake/Output:   Intake/Output Summary (Last 24 hours) at 03/22/2023 0731 Last data filed at 03/22/2023 0500 Gross per 24 hour  Intake 1154 ml  Output 1450 ml  Net -296 ml      Physical Exam  General:  elderly appearing.  No respiratory difficulty HEENT: normal Neck: supple. JVD ~7 cm.  Cor: PMI nondisplaced. Regular rate & rhythm. No rubs, gallops or murmurs. Lungs: exp  wheezes Extremities: no cyanosis, clubbing, rash, non-pitting BLE edema. + UNNA boots Neuro: alert & oriented x 3. Moves all 4 extremities w/o difficulty. Affect pleasant.   Telemetry   Not on tele?  Labs    CBC Recent Labs    03/20/23 0434 03/21/23 0445  WBC 8.8 10.1  HGB 7.8* 8.2*  HCT 23.7* 25.1*  MCV 85.3 82.8  PLT 621* 663*   Basic Metabolic Panel Recent Labs    40/98/11 0445 03/22/23 0512  NA 133* 138  K 3.4* 3.3*  CL 99 102  CO2 25 28  GLUCOSE 85 82  BUN 18 16  CREATININE 0.73 0.68  CALCIUM 7.5* 7.5*  MG 1.9  --   PHOS 2.4* 2.5   Liver Function Tests Recent Labs    03/21/23 0445 03/22/23 0512  ALBUMIN <1.5* 1.9*   No results for input(s): "LIPASE", "AMYLASE" in the last 72 hours. Cardiac Enzymes No results for input(s): "CKTOTAL", "CKMB", "CKMBINDEX", "TROPONINI" in the last 72 hours.  BNP: BNP (last 3 results) Recent Labs    02/04/23 0523 03/07/23 2035  BNP 315.9* 193.3*    ProBNP (last 3 results) No results for input(s): "PROBNP" in the last 8760 hours.   D-Dimer No results for input(s): "DDIMER" in the last 72 hours. Hemoglobin A1C No results for input(s): "HGBA1C" in the last 72 hours. Fasting Lipid Panel No results for input(s): "CHOL", "HDL", "LDLCALC", "TRIG", "CHOLHDL", "LDLDIRECT" in the last 72 hours. Thyroid  Function Tests No results for input(s): "TSH", "T4TOTAL", "T3FREE", "THYROIDAB" in the last 72 hours.  Invalid input(s): "FREET3"  Other results:   Imaging    No results found.    Medications:     Scheduled Medications:  amiodarone  200 mg Oral Daily   apixaban  5 mg Oral BID   ascorbic acid  500 mg Oral Daily   calcium carbonate  2 tablet Oral Q breakfast   cholecalciferol  1,000 Units Oral Daily   cyanocobalamin  1,000 mcg Oral Daily   famotidine  20 mg Oral BID WC   ferrous sulfate  325 mg Oral BID WC   fluticasone  1 spray Each Nare Daily   folic acid  1 mg Oral Daily   gabapentin  300 mg Oral  TID   hydrocortisone  15 mg Oral Q breakfast   hydrocortisone  5 mg Oral BID AC   ipratropium-albuterol  3 mL Nebulization BID   lactose free nutrition  237 mL Oral TID BM   loratadine  10 mg Oral QPM   metoprolol succinate  12.5 mg Oral Daily   midodrine  15 mg Oral TID WC   montelukast  10 mg Oral Daily   nortriptyline  50 mg Oral QHS   pantoprazole  40 mg Oral Q breakfast   polyvinyl alcohol  1 drop Both Eyes TID   potassium chloride  40 mEq Oral Once   pyridostigmine  30 mg Oral BID   scopolamine  1 patch Transdermal Q72H   sodium chloride flush  3 mL Intravenous Q12H   spironolactone  12.5 mg Oral Daily   thiamine  100 mg Oral Daily   torsemide  20 mg Oral Daily   vancomycin  125 mg Oral QID   vitamin A  10,000 Units Oral Daily    Infusions:  albumin human Stopped (03/21/23 1213)     PRN Medications: oxyCODONE **AND** acetaminophen, acetaminophen, acetaminophen, albuterol, diphenhydrAMINE, docusate sodium, guaiFENesin-dextromethorphan, liver oil-zinc oxide, melatonin, ondansetron (ZOFRAN) IV, ondansetron (ZOFRAN) IV, phenol, sodium chloride flush    Assessment/Plan   1. Acute on chronic systolic CHF: Echo this admission with EF 35-40%, severe LV dilation, mild LVH, normal RV, mild MR, mild aortic stenosis.  EF was 25-30% by echo in 2018 but improved back to normal range.  Now it has fallen again. GDMT limited by orthostatic hypotension.  She has had prominent peripheral edema, not as prominent JVD. She was diuresed here and RHC 3/4 showed low filling pressures, preserved cardiac output.  Coronary angiography showed no CAD, nonischemic cardiomyopathy.  Cardiac MRI showed LV EF 30%, RV EF 38%, myocardium difficult to null with ECV 41%.  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 However, the LV wall was not significantly thickened. Appears euvolemic today.  No room for titration of  GDMT with ongoing soft BP.  - Continue torsemide 20 mg daily.  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  Ideally would get a PYP scan, but this may have to be done as an outpatient.  Faint IgA band on SPEP, pending UPEP.  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  - Unna boots, making her itchy. Can switch out for ted hose.   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 If abdominal symptoms continue, may have to stop pyridostigmine.  -  She can continue Toprol XL 12.5 mg daily.  - Continue spironolactone 12.5 daily.  - Not a candidate for SGLT2 inhibitors due to frequent UTIs.  2. SVT: She is now on amiodarone with no further SVT runs.  - Continue amiodarone 200 mg once daily.  3. PVCs: H/o PVC ablation in 2019.  Still with fairly frequent PVCs on telemetry (from last week, not on tele today).  - Continue amiodarone.  4. Giant cell arteritis: Prednisone in past, now on hydrocortisone.  5. H/o diverticulitis with sigmoid colectomy and colostomy.  6. H/o DVT/PE:  - Continue Eliquis.  7. Elevated troponin: Suspect demand ischemia with volume overload.  Minimal CAD on coronary angiography.  8. C.Diff: WBCs 21 => 16 => 13 > WNL. She had low grade fever prior, afebrile today.  Treated for ?UTI earlier in stay with IV abx.  She has had abdominal pain and diarrhea.  CXR with no pneumonia, CT abd/pelvis with no acute findings.  Blood cultures NGTD.  - Currently denies abdominal pain/diarrhea.  If resumes/continues, may need to stop pyridostigmine.  - C diff + - GI panel + e coli - ID is following.  - Now on Vanc 9. 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  She is now on midodrine and pyridostigmine, legs are wrapped> switching to ted hose today.  May need abdominal binder.   Continue to mobilize as tolerated with PT/OT.  If orthostatic, can place abd binder.     Length of Stay: 15  Alen Bleacher, NP  03/22/2023, 7:31 AM  Advanced Heart Failure Team Pager [phone] (M-F; 7a - 5p)  Please contact [facility] for night-coverage after hours (5p -7a ) and weekends on [email or web address]   Patient seen and examined with the above-signed Advanced Practice Provider and/or Housestaff. I personally reviewed laboratory data, imaging studies and relevant notes. I independently examined the patient and formulated the important aspects of the plan. I have edited the note to reflect any of my changes or salient points. I have personally discussed the plan with the patient and/or family.  Feels weak. Continues with cough. Diuresing well on po torsemide. Currently getting albumin   General:  Elderly woman lying in bed .  HEENT: normal Neck: supple. no JVD. Carotids 2+ bilat; no bruits. No lymphadenopathy or thryomegaly appreciated. Cor: PMI nondisplaced. Regular rate & rhythm. No rubs, gallops or murmurs. Lungs: + wheeze Abdomen: obese soft, nontender, nondistended. No hepatosplenomegaly. No bruits or masses. Good bowel sounds. Extremities: no cyanosis, clubbing, rash, edema + UNNA boots Neuro: alert & orientedx3, cranial nerves grossly intact. moves all 4 extremities w/o difficulty. Affect pleasant  She is stable from a HF perspective. Continue po torsemide. Rhythm stable on po amio.   Continue midodrine and LE wraps for hypotension   AHF team will s/o. Will continue w/u for potential TTR as an outpatient  Arvilla Meres, MD  1:37 PM

## 2023-03-22 NOTE — Progress Notes (Signed)
 Initial Nutrition Assessment  DOCUMENTATION CODES:   Non-severe (moderate) malnutrition in context of chronic illness, Obesity unspecified  INTERVENTION:   -Liberalize diet to 2 gram sodium for wider variety of meal selections -D/c Boost Plus -Glucerna Shake po BID, each supplement provides 220 kcal and 10 grams of protein  -Ensure Max po daily, each supplement provides 150 kcal and 30 grams of protein.   -2 mg copper daily x 60 days -200 mg zinc sulfate daily x 30 days  -500 mg vitamin C BID  NUTRITION DIAGNOSIS:   Moderate Malnutrition related to chronic illness (CHF, COPD, adrenal insufficiency) as evidenced by moderate fat depletion, severe fat depletion, mild muscle depletion, moderate muscle depletion, edema, percent weight loss.  GOAL:   Patient will meet greater than or equal to 90% of their needs  MONITOR:   PO intake, Supplement acceptance  REASON FOR ASSESSMENT:   Consult Assessment of nutrition requirement/status  ASSESSMENT:   Pt with medical history significant of COPD, CHF with EF of 40-45%, depression, obesity, anemia, DVT and PE on Eliquis, temporal arteritis, renal cell carcinoma, adrenal insufficiency, s/p for colostomy secondary to perforated diverticulitis, who presents with fever, increased urinary frequency.  Pt admitted with secondary adrenal insufficiency, abdominal pain, and RSV infection.  Reviewed I/O's: -296 ml x 24 hours and -12.6 L since admission  UOP: 1.3 L x 24 hours  Colostomy output: 150 ml x 24 hours   Pt familiar to this RD due to recent prior admission. Pt recently had a prolonged hospitalization and was subsequently discharge to [facility] for rehab. 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 Pt also with adrenal insuffiencey; pt reports that she had been taking "a lot of vitamins" and suspected micronutrient deficiencies were making her weak.   Pt shares that she had been making good progress since  last hospital discharged until my legs started giving out". Pt expresses concern that she is no longer able to walk.   Pt reports fair appetite, but is growing tired of hospital food options. Pt reports it is sometimes difficult for her to find things, especially as she has no teeth. RD discussed possibility of downgrading diet for ease of intake, however, pt would prefer regular consistency diet due to fewer diet restrictions. Noted meal completions 100%.  Pt reports having trouble tolerating supplements due to diarrhea. Pt reports she can drink one Boost Plus supplement daily, but Ensure causes high output in colostomy bag. Pt amenable to try different supplement options for tolerance.    Pt reports she has "lost a lot of weight" but suspects that some if this is related to diureses. Per pt, her UBW is around 250#. Reviewed wt hx; pt has experienced a 7.2% wt loss over the past 6 weeks, which is significant for time frame. Suspect edema may be masking further weight loss as well as fat and muscle depletions.   Discussed importance of good meal and supplement intake to promote healing.   Medications reviewed and include vitamin C, calcium carbonate, vitamin D3, vitamin B-12, ferrous sulfate, neurontin, protonix, thiamine, demadex, and vitamin A.   Labs reviewed: K: 3.3, CBGS: 114 (inpatient orders for glycemic control are ). Vitamin A: 9.4 (02/01/23), Zinc: 29 (02/01/23), copper: 29 (02/01/23), iron: 16 (02/01/23)  Vitamin K, vitamin E,  vitamin D, thiamine, and folate WDL . Vitamin B-12: 1780.   NUTRITION - FOCUSED PHYSICAL EXAM:  Flowsheet Row Most Recent Value  Orbital Region Severe depletion  Upper Arm  Region Severe depletion  Thoracic and Lumbar Region No depletion  Buccal Region Moderate depletion  Temple Region Severe depletion  Clavicle Bone Region No depletion  Clavicle and Acromion Bone Region No depletion  Scapular Bone Region No depletion  Dorsal Hand Moderate depletion  Patellar  Region Mild depletion  Anterior Thigh Region Mild depletion  Posterior Calf Region Mild depletion  Edema (RD Assessment) Moderate  Hair Reviewed  Eyes Reviewed  Mouth Reviewed  Skin Reviewed  Nails Reviewed       Diet Order:   Diet Order             Diet 2 gram sodium Room service appropriate? Yes; Fluid consistency: Thin  Diet effective now                   EDUCATION NEEDS:   Education needs have been addressed  Skin:  Skin Assessment: Skin Integrity Issues: Skin Integrity Issues:: Stage II Stage II: rt and lt ischial tuberosity  Last BM:  03/21/23 (type 6- 150 ml via colostomy)  Height:   Ht Readings from Last 1 Encounters:  03/07/23 5\' 8"  (1.727 m)    Weight:   Wt Readings from Last 1 Encounters:  03/22/23 119.2 kg    Ideal Body Weight:  63.6 kg  BMI:  Body mass index is 39.96 kg/m.  Estimated Nutritional Needs:   Kcal:  1900-2100  Protein:  100-115 grams  Fluid:  > 1.9 L    Levada Schilling, RD, LDN, CDCES Registered Dietitian III Certified Diabetes Care and Education Specialist If unable to reach this RD, please use "RD Inpatient" group chat on secure chat between hours of 8am-4 pm daily

## 2023-03-23 DIAGNOSIS — E440 Moderate protein-calorie malnutrition: Secondary | ICD-10-CM | POA: Diagnosis present

## 2023-03-23 DIAGNOSIS — I5020 Unspecified systolic (congestive) heart failure: Secondary | ICD-10-CM | POA: Diagnosis not present

## 2023-03-23 DIAGNOSIS — J432 Centrilobular emphysema: Secondary | ICD-10-CM | POA: Diagnosis not present

## 2023-03-23 DIAGNOSIS — A419 Sepsis, unspecified organism: Secondary | ICD-10-CM | POA: Diagnosis not present

## 2023-03-23 DIAGNOSIS — E2749 Other adrenocortical insufficiency: Secondary | ICD-10-CM | POA: Diagnosis not present

## 2023-03-23 LAB — RENAL FUNCTION PANEL
Albumin: 2.2 g/dL — ABNORMAL LOW (ref 3.5–5.0)
Anion gap: 7 (ref 5–15)
BUN: 12 mg/dL (ref 8–23)
CO2: 27 mmol/L (ref 22–32)
Calcium: 7.8 mg/dL — ABNORMAL LOW (ref 8.9–10.3)
Chloride: 100 mmol/L (ref 98–111)
Creatinine, Ser: 0.69 mg/dL (ref 0.44–1.00)
GFR, Estimated: >60 mL/min (ref >60)
Glucose, Bld: 81 mg/dL (ref 70–99)
Phosphorus: 2.7 mg/dL (ref 2.5–4.6)
Potassium: 3.4 mmol/L — ABNORMAL LOW (ref 3.5–5.1)
Sodium: 134 mmol/L — ABNORMAL LOW (ref 135–145)

## 2023-03-23 LAB — CREATININE, SERUM
Creatinine, Ser: 0.61 mg/dL (ref 0.44–1.00)
GFR, Estimated: >60 mL/min (ref >60)

## 2023-03-23 LAB — MAGNESIUM: Magnesium: 1.9 mg/dL (ref 1.7–2.4)

## 2023-03-23 MED ORDER — POTASSIUM CHLORIDE 20 MEQ PO PACK
40.0000 meq | PACK | Freq: Once | ORAL | Status: AC
  Administered 2023-03-23: 40 meq via ORAL
  Filled 2023-03-23: qty 2

## 2023-03-23 NOTE — Progress Notes (Signed)
 Physical Therapy Treatment Patient Details Name: Wendy Sanchez MRN: [id number] DOB: Apr 27, 1950 Today's Date: 03/23/2023   History of Present Illness Pt is a 73 y.o. female admitted with sepsis, adrenal insufficiency, NSTEMI and recurrent UTI. PMH significant for COPD, T2 compression fx, OA, CHF with EF of 40-45%, depression, obesity, anemia, DVT and PE on Eliquis, temporal arteritis, renal cell carcinoma, adrenal insufficiency, s/p for colostomy secondary to perforated diverticulitis.    PT Comments  Pt is making little progress with mobility requiring overall Total to Max A +2 for safety. Pt reports severe pain in buttocks and LE's with movement and is anxious about mobility requiring max encouragement to participate and education on the benefits of movement for pressure relief and strengthening.  Continued PT will assist pt towards greater standing tolerance, LE strengthening, and activity tolerance to increase safety  and decrease burden of care with functional mobility.    If plan is discharge home, recommend the following: Two people to help with walking and/or transfers;A lot of help with bathing/dressing/bathroom;Help with stairs or ramp for entrance;Assist for transportation   Can travel by private vehicle     No  Equipment Recommendations  Other (comment) (TBD)    Recommendations for Other Services       Precautions / Restrictions Precautions Precaution/Restrictions Comments: monitor HR, BP; R heart cath 03/16/23 Restrictions Weight Bearing Restrictions Per Provider Order: No     Mobility  Bed Mobility Overal bed mobility: Needs Assistance   Rolling: Max assist, Total assist, +2 for physical assistance   Supine to sit: Max assist, +2 for physical assistance, +2 for safety/equipment Sit to supine: Max assist, HOB elevated, +2 for physical assistance, +2 for safety/equipment   General bed mobility comments: assistance for LE and trunk support. cues for technique     Transfers Overall transfer level: Needs assistance Equipment used: Rolling walker (2 wheels) Transfers: Sit to/from Stand Sit to Stand: Max assist, +2 physical assistance           General transfer comment: poor tolerance pt reporting significant pain in buttocks and knees. performed sit<>stand x2 with max encouragement and pt insisted on lying back down 2/2 pain.    Ambulation/Gait               General Gait Details: pt declined attempt.   Stairs             Wheelchair Mobility     Tilt Bed    Modified Rankin (Stroke Patients Only)       Balance Overall balance assessment: Needs assistance Sitting-balance support: Feet supported Sitting balance-Leahy Scale: Fair Sitting balance - Comments: difficulty coming up into a sitting position Max A, but once upright balances at supervision level.   Standing balance support: Bilateral upper extremity supported, Reliant on assistive device for balance Standing balance-Leahy Scale: Poor Standing balance comment: Mod-Max A +2, with RW                            Communication Communication Communication: No apparent difficulties  Cognition Arousal: Alert Behavior During Therapy: WFL for tasks assessed/performed   PT - Cognitive impairments: No apparent impairments                       PT - Cognition Comments: fearful of movement 2/2 pain in buttocks Following commands: Intact      Cueing Cueing Techniques: Verbal cues  Exercises      General  Comments        Pertinent Vitals/Pain Pain Assessment Pain Score: 10-Worst pain ever Faces Pain Scale: Hurts whole lot Pain Location: B knees and buttocks Pain Descriptors / Indicators: Aching, Discomfort Pain Intervention(s): Patient requesting pain meds-RN notified, Repositioned, Monitored during session, Limited activity within patient's tolerance    Home Living                          Prior Function            PT  Goals (current goals can now be found in the care plan section) Acute Rehab PT Goals Patient Stated Goal: to get better PT Goal Formulation: With patient Time For Goal Achievement: 03/23/23 Potential to Achieve Goals: Good Additional Goals Additional Goal #1: Pt will be able to perform bed mobility/transfers with min assist +2 and RW in order to improve functional independence Progress towards PT goals: Not progressing toward goals - comment (pt reports severe pain with movement and is anxious about mobility.)    Frequency    Min 1X/week      PT Plan      Co-evaluation              AM-PAC PT "6 Clicks" Mobility   Outcome Measure  Help needed turning from your back to your side while in a flat bed without using bedrails?: Total Help needed moving from lying on your back to sitting on the side of a flat bed without using bedrails?: Total Help needed moving to and from a bed to a chair (including a wheelchair)?: Total Help needed standing up from a chair using your arms (e.g., wheelchair or bedside chair)?: Total Help needed to walk in hospital room?: Total Help needed climbing 3-5 steps with a railing? : Total 6 Click Score: 6    End of Session Equipment Utilized During Treatment: Gait belt Activity Tolerance: Patient limited by pain;Patient limited by fatigue Patient left: in bed;with call bell/phone within reach Nurse Communication: Mobility status PT Visit Diagnosis: Muscle weakness (generalized) (M62.81);Unsteadiness on feet (R26.81);Difficulty in walking, not elsewhere classified (R26.2)     Time: 3295-1884 PT Time Calculation (min) (ACUTE ONLY): 29 min  Charges:    $Therapeutic Activity: 23-37 mins PT General Charges $$ ACUTE PT VISIT: 1 Visit                     Hortencia Conradi, PTA  03/23/23, 10:46 AM

## 2023-03-23 NOTE — Plan of Care (Signed)

## 2023-03-23 NOTE — TOC Progression Note (Signed)
 Transition of Care [facility]) - Progression Note    Patient Details  Name: NATIKA GEYER MRN: [id number] Date of Birth: 05-Apr-1950  Transition of Care [facility]) CM/SW Contact  Truddie Hidden, RN Phone Number: 03/23/2023, 1:54 PM  Clinical Narrative:    [employer] [employer] for [employer] started.   Expected Discharge Plan: Skilled Nursing Facility Barriers to Discharge: Continued Medical Work up  Expected Discharge Plan and Services     Post Acute Care Choice: Skilled Nursing Facility Living arrangements for the past 2 months: Skilled Nursing Facility                                       Social Determinants of Health (SDOH) Interventions SDOH Screenings   Food Insecurity: No Food Insecurity (03/12/2023)  Housing: Low Risk  (03/12/2023)  Transportation Needs: Unmet Transportation Needs (03/12/2023)  Utilities: Not At Risk (03/08/2023)  Depression (PHQ2-9): Low Risk  (07/01/2022)  Financial Resource Strain: Low Risk  (12/26/2020)   Received from [facility], [facility]  Social Connections: Unknown (03/08/2023)  Tobacco Use: High Risk (03/12/2023)    Readmission Risk Interventions    03/08/2023   11:09 AM 02/03/2021    9:12 AM  Readmission Risk Prevention Plan  Transportation Screening Complete Complete  Medication Review (RN Care Manager) Complete Complete  PCP or Specialist appointment within 3-5 days of discharge Complete Complete  HRI or Home Care Consult  Complete  SW Recovery Care/Counseling Consult Complete   Palliative Care Screening Not Applicable Not Applicable  Skilled Nursing Facility Complete Complete

## 2023-03-23 NOTE — Progress Notes (Signed)
 Occupational Therapy Treatment Patient Details Name: Wendy Sanchez MRN: [id number] DOB: 12/02/1950 Today's Date: 03/23/2023   History of present illness Pt is a 73 y.o. female admitted with sepsis, adrenal insufficiency, NSTEMI and recurrent UTI. PMH significant for COPD, T2 compression fx, OA, CHF with EF of 40-45%, depression, obesity, anemia, DVT and PE on Eliquis, temporal arteritis, renal cell carcinoma, adrenal insufficiency, s/p for colostomy secondary to perforated diverticulitis.   OT comments  Chart reviewed to date, pt greeted semi supine in bed, agreeable to OT intervention targeting improving functional activity tolerance in preparation for ADLs. Discussed HEP with pt using bed features/theraband to facilitate/promote strengthening in preparation for functional mobility, especially out of bed. Discussed importance of getting out of bed, use of transfer device/lift. Pt deferred to another time but is open to trialing. Pt is left in chair position, all needs met. OT will continue to follow.      If plan is discharge home, recommend the following:  Two people to help with walking and/or transfers;Two people to help with bathing/dressing/bathroom;Assistance with cooking/housework   Equipment Recommendations  Other (comment) (defer)    Recommendations for Other Services      Precautions / Restrictions Precautions Precautions: Fall Recall of Precautions/Restrictions: Intact Precaution/Restrictions Comments: monitor HR, BP; R heart cath 03/16/23 Restrictions Weight Bearing Restrictions Per Provider Order: No       Mobility Bed Mobility Overal bed mobility: Needs Assistance Bed Mobility: Rolling           General bed mobility comments: semi supine to fully upright sitting 10x with MIN A    Transfers                   General transfer comment: pt declines on this date- has been sitting on edge of bed with PT earlier     Balance                                            ADL either performed or assessed with clinical judgement   ADL Overall ADL's : Needs assistance/impaired     Grooming: Set up;Sitting               Lower Body Dressing: Total assistance;Bed level                      Extremity/Trunk Assessment              Vision       Perception     Praxis     Communication Communication Communication: No apparent difficulties   Cognition Arousal: Alert Behavior During Therapy: WFL for tasks assessed/performed Cognition: No apparent impairments                               Following commands: Intact        Cueing   Cueing Techniques: Verbal cues  Exercises Other Exercises Other Exercises: ankle pumps 10x, SLR 10x BLE, educated on BUE strengthening with theraband (Per Dr. Clarise Cruz no restrictions)    Shoulder Instructions       General Comments vss throughout    Pertinent Vitals/ Pain       Pain Assessment Pain Assessment: No/denies pain  Home Living  Prior Functioning/Environment              Frequency  Min 1X/week        Progress Toward Goals  OT Goals(current goals can now be found in the care plan section)  Progress towards OT goals: Progressing toward goals  Acute Rehab OT Goals Time For Goal Achievement: 04/19/23  Plan      Co-evaluation                 AM-PAC OT "6 Clicks" Daily Activity     Outcome Measure   Help from another person eating meals?: None Help from another person taking care of personal grooming?: A Little Help from another person toileting, which includes using toliet, bedpan, or urinal?: Total Help from another person bathing (including washing, rinsing, drying)?: Total Help from another person to put on and taking off regular upper body clothing?: A Lot Help from another person to put on and taking off regular lower body clothing?: Total 6 Click  Score: 12    End of Session    OT Visit Diagnosis: Muscle weakness (generalized) (M62.81);Other abnormalities of gait and mobility (R26.89);Unsteadiness on feet (R26.81)   Activity Tolerance Patient tolerated treatment well   Patient Left with call bell/phone within reach;in bed;with bed alarm set;with nursing/sitter in room   Nurse Communication Mobility status        Time: 1610-9604 OT Time Calculation (min): 13 min  Charges: OT General Charges $OT Visit: 1 Visit OT Treatments $Therapeutic Activity: 8-22 mins  Oleta Mouse, OTD OTR/L  03/23/23, 4:10 PM

## 2023-03-23 NOTE — Progress Notes (Signed)
 Progress Note   Patient: CHANCY CLAROS ZOX:[id number] DOB: July 15, 1950 DOA: 03/07/2023     16 DOS: the patient was seen and examined on 03/23/2023   Brief hospital course: Taken from H&P  Wendy Sanchez is a 73 y.o. female with medical history significant of COPD, CHF with EF of 40-45%, depression, obesity, anemia, DVT and PE on Eliquis, temporal arteritis, renal cell carcinoma, adrenal insufficiency, s/p for colostomy secondary to perforated diverticulitis, who presents with fever, increased urinary frequency.   Patient was found to have hypotension with blood pressure 86/59, which improved to 101/70 after giving 2 L LR bolus in ED, but blood pressure has dropped again to 82/61 with MAP of 69 when I saw pt in ED. Her mental status is normal.  pt was found to have WBC 10.3, GFR> 60, negative PCR for COVID, flu and RSV, UA (hazy appearance, small amount of leukocyte, rare bacteria, WBC 11-20), lactic acid of 1.7 --> 2.2 --> 1.5, troponin 217 --> 251.  Chest x-ray negative for infiltration.  EKG: Sinus rhythm, QTc 484, LAE, LAD, poor R wave progression, low voltage.   2/24: Blood pressure with some improvement to 97/63, patient was started on midodrine and stress doses of steroid.  Elevated leukocytosis today likely secondary to stress doses of steroid.  Preliminary blood cultures negative.  Urine cultures pending.  Troponin peaked at 251 and now trending down.  Family is very concerned about this recurrent UTI, patient has multiple hospitalization for this and has been seen outpatient urology, there were recommending continuing antibiotics at suppressive doses.  2/25: Vital stable.  Blood pressure seems within goal now.  Mild hypokalemia which are being repleted.  Blood cultures remain negative, urine cultures with no growth.  Stopping antibiotics.  Sepsis ruled out.  Increasing the dose of midodrine. Had an episode of sinus tachycardia, restarting home metoprolol and torsemide.  PT is  recommending SNF, not sure if she has any days left  2/26: Vital stable with blood pressure in low 100s.  Echo with EF of 35 to 40%, left ventricular global hypokinesis and severely dilated cavity, grade 1 diastolic dysfunction.  Small pericardial effusion. Patient currently stable to go back to her facility, she needs another insurance authorization which was started today.  She will go back once insurance authorization got approval.  Patient is high risk for readmission and mortality based on underlying comorbidities and poor functional status.  Palliative care was also consulted.  3/5: Patient with new leukocytosis, abdominal pain, watery diarrhea and upper respiratory symptoms.  Respiratory panel was checked and came back positive for RSV.  Patient was also recently started on pyridostigmine which can cause abdominal pain and diarrhea per ID, ordered CT abdomen to rule out any colitis.  Cardiology was also consulted and patient underwent cardiac MRI and right and left cardiac catheterization with following results Cardiac MRI:  1.  Small circumferential pericardial effusion. 2.  Mildly dilated LV with diffuse hypokinesis, EF 30%. 3.  Normal RV size with RV EF 38%. 4. Difficult delayed enhancement images, looked hard to null LV myocardium. Possible small area of mid-wall LGE in the mid inferior wall, nonspecific. 5.  Extracellular volume percentage significantly elevated at 41%.   RHC/LHC: 1. Low filling pressures.  2. Normal cardiac output.  3. 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  4. 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 She was also started on amiodarone for concern of SVT and  PVCs.  3/6: Febrile at 101.4 over the past 24-hour.  Patient with more upper respiratory symptoms.  Continue to have mild abdominal discomfort but mostly pointing towards epigastrium, loose  stools in colostomy bag.  Improving leukocytosis, C. difficile came back positive for antigen and negative for toxin-pending PCR.  GI pathogen panel and UA was also ordered and pending.  CT abdomen was negative for any concern of colitis or diverticulitis.  Chest x-ray normal.  Likely due to RSV  3/7: C. difficile PCR came back positive so started on p.o. vancomycin.  GI pathogen panel is also positive for EPEC-no need to treat per ID. Hemoglobin decreased to 7.7 without any obvious bleeding, FOBT was negative.  Anemia panel with low iron, TIBC was not calculated. Likely anemia of chronic disease with mild iron deficiency. UA with large leukocytes-urine cultures ordered Also developed mild hyponatremia-giving some normal saline  3/8: Overall stable with stable labs, hyponatremia improving.  Urine cultures pending.  3/9: Slowly improving respiratory symptoms.  Abdominal pain has been resolved with improving diarrhea.  Persistently low albumin so started on albumin infusion for 3 days after discussing with cardiology.  3/10: Remained hemodynamically stable, albumin at 1.9 and potassium of 3.3 today.  Pending insurance authorization for rehab.  Urine cultures with multiple species.  3/11: Patient remained stable and continued to improve.  Pending insurance authorization for rehab.   Assessment and Plan: Secondary adrenal insufficiency (HCC) Blood pressure currently borderline soft Patient received stress doses of Solu-Cortef Continue home Cortef Increasing the dose of midodrine  Abdominal pain RSV infection And developed  fever with leukocytosis overnight, leukocytosis started improving. Found to have RSV positive, C. difficile antigen and PCR positive, GI pathogen panel for E. Coli, UA with large leukocytes-pending cultures now -CT abdomen negative for diverticulitis or colitis. -Chest x-ray normal -ID was also consulted-started her on p.o. vancomycin -Supportive care  Myocardial  injury Troponin peaked at 251 and then started trending down.  No chest pain.  Likely demand ischemia with concern of sepsis. -Echocardiogram EF of 35 to 40%, severe LV dilation,mild LVH, normal RV, mild MR, mild aortic stenosis.  Cardiac catheterization with concern of nonischemic cardiomyopathy -Continue aspirin 81 mg daily -Completed 48 hours of heparin infusion.  Chronic obstructive pulmonary disease (COPD) (HCC) No concern of exacerbation. -Continue home bronchodilators  Chronic pain syndrome -As needed Tylenol and oxycodone  HFrEF (heart failure with reduced ejection fraction) (HCC)  2D echo on 10/07/2022 showed EF of 40-45%.  Patient has 1+ leg edema, but no worsening shortness breath, does not seem to have CHF exacerbation.  BNP of 193 Repeat echo with EF of 35 to 40%, severe LV dilation,mild LVH, normal RV, mild MR, mild aortic stenosis.  Patient also underwent right and left cardiac catheterization which shows nonischemic cardiomyopathy. 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 -Cardiology is on board and recommending continue low-dose Toprol and spironolactone -Continue amiodarone  History of pulmonary embolus 2020(PE) Restarting home Eliquis  Depression with anxiety -Continue home meds  Obesity (BMI 30-39.9) Estimated body mass index is 39.34 kg/m as calculated from the following:   Height as of this encounter: 5\' 8"  (1.727 m).   Weight as of this encounter: 117.3 kg.   -This will complicate overall prognosis -Encouraged weight loss  Pressure injury of skin Pressure Injury 03/08/23 Ischial tuberosity Left Stage 2 -  Partial thickness loss of dermis presenting as a shallow open injury with a red, pink wound bed without slough. (  Active)  03/08/23 1100  Location: Ischial tuberosity  Location Orientation: Left  Staging: Stage 2 -  Partial thickness loss of dermis presenting as a shallow open injury with a red, pink wound  bed without slough.  Wound Description (Comments):   Present on Admission: Yes     Pressure Injury 03/08/23 Ischial tuberosity Right Stage 2 -  Partial thickness loss of dermis presenting as a shallow open injury with a red, pink wound bed without slough. (Active)  03/08/23 1100  Location: Ischial tuberosity  Location Orientation: Right  Staging: Stage 2 -  Partial thickness loss of dermis presenting as a shallow open injury with a red, pink wound bed without slough.  Wound Description (Comments):   Present on Admission: Yes   Present on admission     Subjective: Patient with improved cough and congestion, no more abdominal pain and diarrhea improved.  Physical Exam: Vitals:   03/23/23 0832 03/23/23 0905 03/23/23 1204 03/23/23 1459  BP: (!) 86/64 99/72 107/63 93/65  Pulse: 86 88 95 90  Resp: 18 18 20 18   Temp: 98.6 F (37 C)  98.7 F (37.1 C) (!) 97.2 F (36.2 C)  TempSrc: Oral     SpO2: 96% 100% 100% 100%  Weight:      Height:       General.  Frail and obese elderly lady, in no acute distress. Pulmonary.  Lungs clear bilaterally, normal respiratory effort. CV.  Regular rate and rhythm, no JVD, rub or murmur. Abdomen.  Soft, nontender, nondistended, BS positive. CNS.  Alert and oriented .  No focal neurologic deficit. Extremities.  Trace LE edema, bilateral lower extremities wrapped  Data Reviewed: Prior data reviewed  Family Communication:   Disposition: Status is: Inpatient Remains inpatient appropriate because: Severity of illness  Planned Discharge Destination: Skilled nursing facility  Time spent: 44 minutes  This record has been created using Conservation officer, historic buildings. Errors have been sought and corrected,but may not always be located. Such creation errors do not reflect on the standard of care.   Author: Arnetha Courser, MD 03/23/2023 3:45 PM  For on call review [email or web address].

## 2023-03-24 DIAGNOSIS — I5020 Unspecified systolic (congestive) heart failure: Secondary | ICD-10-CM | POA: Diagnosis not present

## 2023-03-24 DIAGNOSIS — A419 Sepsis, unspecified organism: Secondary | ICD-10-CM | POA: Diagnosis not present

## 2023-03-24 DIAGNOSIS — J432 Centrilobular emphysema: Secondary | ICD-10-CM | POA: Diagnosis not present

## 2023-03-24 DIAGNOSIS — E2749 Other adrenocortical insufficiency: Secondary | ICD-10-CM | POA: Diagnosis not present

## 2023-03-24 LAB — RENAL FUNCTION PANEL
Albumin: 2.2 g/dL — ABNORMAL LOW (ref 3.5–5.0)
Anion gap: 7 (ref 5–15)
BUN: 12 mg/dL (ref 8–23)
CO2: 28 mmol/L (ref 22–32)
Calcium: 8 mg/dL — ABNORMAL LOW (ref 8.9–10.3)
Chloride: 101 mmol/L (ref 98–111)
Creatinine, Ser: 0.64 mg/dL (ref 0.44–1.00)
GFR, Estimated: >60 mL/min (ref >60)
Glucose, Bld: 72 mg/dL (ref 70–99)
Phosphorus: 2.9 mg/dL (ref 2.5–4.6)
Potassium: 3.3 mmol/L — ABNORMAL LOW (ref 3.5–5.1)
Sodium: 136 mmol/L (ref 135–145)

## 2023-03-24 MED ORDER — POTASSIUM CHLORIDE 20 MEQ PO PACK
40.0000 meq | PACK | Freq: Once | ORAL | Status: AC
  Administered 2023-03-24: 40 meq via ORAL
  Filled 2023-03-24: qty 2

## 2023-03-24 NOTE — Plan of Care (Signed)

## 2023-03-24 NOTE — Consult Note (Signed)
 WOC consulted for Stage 3 Pressure Injury sacral wound, we will see tomorrow for Unna's boot change, no WOC nursing on the [facility] campus today.  Requested picture of wound from MD.   Armen Pickup MSN,RN,CWOCN, CNS, CWON-AP [phone]

## 2023-03-24 NOTE — Progress Notes (Signed)
 PT Cancellation Note  Patient Details Name: Wendy Sanchez MRN: [id number] DOB: [birth year]   Cancelled Treatment:     PT attempt. Pt endorses severe headache and was unwilling to participate. "Will you let me rest today and come back tomorrow?"    Rushie Chestnut 03/24/2023, 3:01 PM

## 2023-03-24 NOTE — TOC Progression Note (Signed)
 Transition of Care [facility]) - Progression Note    Patient Details  Name: Wendy Sanchez MRN: [id number] Date of Birth: 1950/08/16  Transition of Care [facility]) CM/SW Contact  Garret Reddish, RN Phone Number: 03/24/2023, 9:34 AM  Clinical Narrative:    Chart reviewed.  SNF authorization still pending.    TOC will continue to follow for discharge planning.     Expected Discharge Plan: Skilled Nursing Facility Barriers to Discharge: Continued Medical Work up  Expected Discharge Plan and Services     Post Acute Care Choice: Skilled Nursing Facility Living arrangements for the past 2 months: Skilled Nursing Facility                                       Social Determinants of Health (SDOH) Interventions SDOH Screenings   Food Insecurity: No Food Insecurity (03/12/2023)  Housing: Low Risk  (03/12/2023)  Transportation Needs: Unmet Transportation Needs (03/12/2023)  Utilities: Not At Risk (03/08/2023)  Depression (PHQ2-9): Low Risk  (07/01/2022)  Financial Resource Strain: Low Risk  (12/26/2020)   Received from [facility], [facility]  Social Connections: Unknown (03/08/2023)  Tobacco Use: High Risk (03/12/2023)    Readmission Risk Interventions    03/08/2023   11:09 AM 02/03/2021    9:12 AM  Readmission Risk Prevention Plan  Transportation Screening Complete Complete  Medication Review (RN Care Manager) Complete Complete  PCP or Specialist appointment within 3-5 days of discharge Complete Complete  HRI or Home Care Consult  Complete  SW Recovery Care/Counseling Consult Complete   Palliative Care Screening Not Applicable Not Applicable  Skilled Nursing Facility Complete Complete

## 2023-03-24 NOTE — Progress Notes (Signed)
 Progress Note   Patient: Wendy Sanchez NWG:[id number] DOB: Jan 01, 1951 DOA: 03/07/2023     17 DOS: the patient was seen and examined on 03/24/2023   Brief hospital course: Taken from H&P  Wendy Sanchez is a 73 y.o. female with medical history significant of COPD, CHF with EF of 40-45%, depression, obesity, anemia, DVT and PE on Eliquis, temporal arteritis, renal cell carcinoma, adrenal insufficiency, s/p for colostomy secondary to perforated diverticulitis, who presents with fever, increased urinary frequency.   Patient was found to have hypotension with blood pressure 86/59, which improved to 101/70 after giving 2 L LR bolus in ED, but blood pressure has dropped again to 82/61 with MAP of 69 when I saw pt in ED. Her mental status is normal.  pt was found to have WBC 10.3, GFR> 60, negative PCR for COVID, flu and RSV, UA (hazy appearance, small amount of leukocyte, rare bacteria, WBC 11-20), lactic acid of 1.7 --> 2.2 --> 1.5, troponin 217 --> 251.  Chest x-ray negative for infiltration.  EKG: Sinus rhythm, QTc 484, LAE, LAD, poor R wave progression, low voltage.   2/24: Blood pressure with some improvement to 97/63, patient was started on midodrine and stress doses of steroid.  Elevated leukocytosis today likely secondary to stress doses of steroid.  Preliminary blood cultures negative.  Urine cultures pending.  Troponin peaked at 251 and now trending down.  Family is very concerned about this recurrent UTI, patient has multiple hospitalization for this and has been seen outpatient urology, there were recommending continuing antibiotics at suppressive doses.  2/25: Vital stable.  Blood pressure seems within goal now.  Mild hypokalemia which are being repleted.  Blood cultures remain negative, urine cultures with no growth.  Stopping antibiotics.  Sepsis ruled out.  Increasing the dose of midodrine. Had an episode of sinus tachycardia, restarting home metoprolol and torsemide.  PT is  recommending SNF, not sure if she has any days left  2/26: Vital stable with blood pressure in low 100s.  Echo with EF of 35 to 40%, left ventricular global hypokinesis and severely dilated cavity, grade 1 diastolic dysfunction.  Small pericardial effusion. Patient currently stable to go back to her facility, she needs another insurance authorization which was started today.  She will go back once insurance authorization got approval.  Patient is high risk for readmission and mortality based on underlying comorbidities and poor functional status.  Palliative care was also consulted.  3/5: Patient with new leukocytosis, abdominal pain, watery diarrhea and upper respiratory symptoms.  Respiratory panel was checked and came back positive for RSV.  Patient was also recently started on pyridostigmine which can cause abdominal pain and diarrhea per ID, ordered CT abdomen to rule out any colitis.  Cardiology was also consulted and patient underwent cardiac MRI and right and left cardiac catheterization with following results Cardiac MRI:  1.  Small circumferential pericardial effusion. 2.  Mildly dilated LV with diffuse hypokinesis, EF 30%. 3.  Normal RV size with RV EF 38%. 4. Difficult delayed enhancement images, looked hard to null LV myocardium. Possible small area of mid-wall LGE in the mid inferior wall, nonspecific. 5.  Extracellular volume percentage significantly elevated at 41%.   RHC/LHC: 1. Low filling pressures.  2. Normal cardiac output.  3. 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  4. 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 She was also started on amiodarone for concern of SVT and  PVCs.  3/6: Febrile at 101.4 over the past 24-hour.  Patient with more upper respiratory symptoms.  Continue to have mild abdominal discomfort but mostly pointing towards epigastrium, loose  stools in colostomy bag.  Improving leukocytosis, C. difficile came back positive for antigen and negative for toxin-pending PCR.  GI pathogen panel and UA was also ordered and pending.  CT abdomen was negative for any concern of colitis or diverticulitis.  Chest x-ray normal.  Likely due to RSV  3/7: C. difficile PCR came back positive so started on p.o. vancomycin.  GI pathogen panel is also positive for EPEC-no need to treat per ID. Hemoglobin decreased to 7.7 without any obvious bleeding, FOBT was negative.  Anemia panel with low iron, TIBC was not calculated. Likely anemia of chronic disease with mild iron deficiency. UA with large leukocytes-urine cultures ordered Also developed mild hyponatremia-giving some normal saline  3/8: Overall stable with stable labs, hyponatremia improving.  Urine cultures pending.  3/9: Slowly improving respiratory symptoms.  Abdominal pain has been resolved with improving diarrhea.  Persistently low albumin so started on albumin infusion for 3 days after discussing with cardiology.  3/10: Remained hemodynamically stable, albumin at 1.9 and potassium of 3.3 today.  Pending insurance authorization for rehab.  Urine cultures with multiple species.  3/11: Patient remained stable and continued to improve.  Pending insurance authorization for rehab.  3/12: Still pending insurance authorization for rehab. Mild hypokalemia which is being repleted.   Assessment and Plan: Secondary adrenal insufficiency (HCC) Blood pressure currently borderline soft Patient received stress doses of Solu-Cortef Continue home Cortef Increasing the dose of midodrine  Abdominal pain RSV infection And developed  fever with leukocytosis overnight, leukocytosis started improving. Found to have RSV positive, C. difficile antigen and PCR positive, GI pathogen panel for E. Coli, UA with large leukocytes-pending cultures now -CT abdomen negative for diverticulitis or colitis. -Chest  x-ray normal -ID was also consulted-started her on p.o. vancomycin -Supportive care  Myocardial injury Troponin peaked at 251 and then started trending down.  No chest pain.  Likely demand ischemia with concern of sepsis. -Echocardiogram EF of 35 to 40%, severe LV dilation,mild LVH, normal RV, mild MR, mild aortic stenosis.  Cardiac catheterization with concern of nonischemic cardiomyopathy -Continue aspirin 81 mg daily -Completed 48 hours of heparin infusion.  Chronic obstructive pulmonary disease (COPD) (HCC) No concern of exacerbation. -Continue home bronchodilators  Chronic pain syndrome -As needed Tylenol and oxycodone  HFrEF (heart failure with reduced ejection fraction) (HCC)  2D echo on 10/07/2022 showed EF of 40-45%.  Patient has 1+ leg edema, but no worsening shortness breath, does not seem to have CHF exacerbation.  BNP of 193 Repeat echo with EF of 35 to 40%, severe LV dilation,mild LVH, normal RV, mild MR, mild aortic stenosis.  Patient also underwent right and left cardiac catheterization which shows nonischemic cardiomyopathy. 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 -Cardiology is on board and recommending continue low-dose Toprol and spironolactone -Continue amiodarone  History of pulmonary embolus 2020(PE) Restarting home Eliquis  Depression with anxiety -Continue home meds  Obesity (BMI 30-39.9) Estimated body mass index is 39.34 kg/m as calculated from the following:   Height as of this encounter: 5\' 8"  (1.727 m).   Weight as of this encounter: 117.3 kg.   -This will complicate overall prognosis -Encouraged weight loss  Pressure injury of skin Pressure Injury 03/08/23 Ischial tuberosity Left Stage 2 -  Partial thickness loss of dermis  presenting as a shallow open injury with a red, pink wound bed without slough. (Active)  03/08/23 1100  Location: Ischial tuberosity  Location Orientation: Left  Staging:  Stage 2 -  Partial thickness loss of dermis presenting as a shallow open injury with a red, pink wound bed without slough.  Wound Description (Comments):   Present on Admission: Yes     Pressure Injury 03/08/23 Ischial tuberosity Right Stage 2 -  Partial thickness loss of dermis presenting as a shallow open injury with a red, pink wound bed without slough. (Active)  03/08/23 1100  Location: Ischial tuberosity  Location Orientation: Right  Staging: Stage 2 -  Partial thickness loss of dermis presenting as a shallow open injury with a red, pink wound bed without slough.  Wound Description (Comments):   Present on Admission: Yes   Present on admission     Subjective: Patient was seen and examined today.  She was feeling cold and asking for more blanket.  Cough seems improving.  Physical Exam: Vitals:   03/23/23 1832 03/23/23 2159 03/24/23 0524 03/24/23 0805  BP: 104/71 106/65 108/64 (!) 95/58  Pulse: 81 88 89 91  Resp: 16 18 18 15   Temp: 98.2 F (36.8 C) 98.2 F (36.8 C) (!) 97.3 F (36.3 C) 98.5 F (36.9 C)  TempSrc:   Oral   SpO2: 100% 100% 100% 98%  Weight:      Height:       General.  Frail and obese elderly lady, in no acute distress. Pulmonary.  Lungs clear bilaterally, normal respiratory effort. CV.  Regular rate and rhythm, no JVD, rub or murmur. Abdomen.  Soft, nontender, nondistended, BS positive. CNS.  Alert and oriented .  No focal neurologic deficit. Extremities.  Trace LE edema, bilateral lower extremities wrapped  Data Reviewed: Prior data reviewed  Family Communication:   Disposition: Status is: Inpatient Remains inpatient appropriate because: Severity of illness  Planned Discharge Destination: Skilled nursing facility  Time spent: 44 minutes  This record has been created using Conservation officer, historic buildings. Errors have been sought and corrected,but may not always be located. Such creation errors do not reflect on the standard of care.    Author: Arnetha Courser, MD 03/24/2023 1:44 PM  For on call review [email or web address].

## 2023-03-25 DIAGNOSIS — A419 Sepsis, unspecified organism: Secondary | ICD-10-CM | POA: Diagnosis not present

## 2023-03-25 DIAGNOSIS — J432 Centrilobular emphysema: Secondary | ICD-10-CM | POA: Diagnosis not present

## 2023-03-25 DIAGNOSIS — E2749 Other adrenocortical insufficiency: Secondary | ICD-10-CM | POA: Diagnosis not present

## 2023-03-25 DIAGNOSIS — I5020 Unspecified systolic (congestive) heart failure: Secondary | ICD-10-CM | POA: Diagnosis not present

## 2023-03-25 LAB — RENAL FUNCTION PANEL
Albumin: 2.1 g/dL — ABNORMAL LOW (ref 3.5–5.0)
Anion gap: 8 (ref 5–15)
BUN: 13 mg/dL (ref 8–23)
CO2: 28 mmol/L (ref 22–32)
Calcium: 8.1 mg/dL — ABNORMAL LOW (ref 8.9–10.3)
Chloride: 100 mmol/L (ref 98–111)
Creatinine, Ser: 0.65 mg/dL (ref 0.44–1.00)
GFR, Estimated: >60 mL/min (ref >60)
Glucose, Bld: 85 mg/dL (ref 70–99)
Phosphorus: 3.5 mg/dL (ref 2.5–4.6)
Potassium: 3.6 mmol/L (ref 3.5–5.1)
Sodium: 136 mmol/L (ref 135–145)

## 2023-03-25 MED ORDER — VITAMIN C 500 MG PO TABS
500.0000 mg | ORAL_TABLET | Freq: Two times a day (BID) | ORAL | Status: DC
  Administered 2023-03-25 – 2023-03-27 (×4): 500 mg via ORAL
  Filled 2023-03-25 (×4): qty 1

## 2023-03-25 NOTE — Plan of Care (Signed)

## 2023-03-25 NOTE — TOC Progression Note (Signed)
 Transition of Care [facility]) - Progression Note    Patient Details  Name: Wendy Sanchez MRN: [id number] Date of Birth: 1950-08-15  Transition of Care [facility]) CM/SW Contact  Erin Sons, [place] Phone Number: 03/25/2023, 9:32 AM  Clinical Narrative:     [facility] still pending at this time.   Expected Discharge Plan: Skilled Nursing Facility Barriers to Discharge: Continued Medical Work up  Expected Discharge Plan and Services     Post Acute Care Choice: Skilled Nursing Facility Living arrangements for the past 2 months: Skilled Nursing Facility                                       Social Determinants of Health (SDOH) Interventions SDOH Screenings   Food Insecurity: No Food Insecurity (03/12/2023)  Housing: Low Risk  (03/12/2023)  Transportation Needs: Unmet Transportation Needs (03/12/2023)  Utilities: Not At Risk (03/08/2023)  Depression (PHQ2-9): Low Risk  (07/01/2022)  Financial Resource Strain: Low Risk  (12/26/2020)   Received from [facility], [facility]  Social Connections: Unknown (03/08/2023)  Tobacco Use: High Risk (03/12/2023)    Readmission Risk Interventions    03/08/2023   11:09 AM 02/03/2021    9:12 AM  Readmission Risk Prevention Plan  Transportation Screening Complete Complete  Medication Review (RN Care Manager) Complete Complete  PCP or Specialist appointment within 3-5 days of discharge Complete Complete  HRI or Home Care Consult  Complete  SW Recovery Care/Counseling Consult Complete   Palliative Care Screening Not Applicable Not Applicable  Skilled Nursing Facility Complete Complete

## 2023-03-25 NOTE — TOC Progression Note (Signed)
 Transition of Care [facility]) - Progression Note    Patient Details  Name: Wendy Sanchez MRN: [id number] Date of Birth: 07/09/1950  Transition of Care [facility]) CM/SW Contact  Erin Sons, [place] Phone Number: 03/25/2023, 1:45 PM  Clinical Narrative:     SNF [facility] is denied. CSW spoke with pt's son and sister on 3 way call. They are appealing SNF denial; ref# for appeal is [id number]. Pt has a Wheelchair, 3in1, and hospital bed at home. She lives at home and son is intermittently available for assistance. Sons states he would do what he needs to do to take care of her at home if he needed to be more available. He is requesting shower chair for pt(will need to notify that this would have out of pocket cost due to medicare not covering). CSW is informed that pt's address is actully [address]  [facility] [place] [facility] [place] [address]. CSW updated this in chart. They are agreeable to [facility] arranging HH if appeal is denied. TOC will continue to follow.   Expected Discharge Plan: Home w Home Health Services Barriers to Discharge: Other (must enter comment) (appealing SNF denial)  Expected Discharge Plan and Services     Post Acute Care Choice: Skilled Nursing Facility Living arrangements for the past 2 months: Skilled Nursing Facility                                       Social Determinants of Health (SDOH) Interventions SDOH Screenings   Food Insecurity: No Food Insecurity (03/12/2023)  Housing: Low Risk  (03/12/2023)  Transportation Needs: Unmet Transportation Needs (03/12/2023)  Utilities: Not At Risk (03/08/2023)  Depression (PHQ2-9): Low Risk  (07/01/2022)  Financial Resource Strain: Low Risk  (12/26/2020)   Received from [facility]  Social Connections: Unknown (03/08/2023)  Tobacco Use: High Risk (03/12/2023)    Readmission Risk Interventions    03/08/2023   11:09 AM 02/03/2021    9:12 AM  Readmission Risk Prevention Plan  Transportation Screening Complete Complete   Medication Review (RN Care Manager) Complete Complete  PCP or Specialist appointment within 3-5 days of discharge Complete Complete  HRI or Home Care Consult  Complete  SW Recovery Care/Counseling Consult Complete   Palliative Care Screening Not Applicable Not Applicable  Skilled Nursing Facility Complete Complete

## 2023-03-25 NOTE — Progress Notes (Signed)
 Progress Note   Patient: Wendy Sanchez VHQ:[id number] DOB: Mar 08, 1950 DOA: 03/07/2023     18 DOS: the patient was seen and examined on 03/25/2023   Brief hospital course: Taken from H&P  Wendy Sanchez is a 73 y.o. female with medical history significant of COPD, CHF with EF of 40-45%, depression, obesity, anemia, DVT and PE on Eliquis, temporal arteritis, renal cell carcinoma, adrenal insufficiency, s/p for colostomy secondary to perforated diverticulitis, who presents with fever, increased urinary frequency.   Patient was found to have hypotension with blood pressure 86/59, which improved to 101/70 after giving 2 L LR bolus in ED, but blood pressure has dropped again to 82/61 with MAP of 69 when I saw pt in ED. Her mental status is normal.  pt was found to have WBC 10.3, GFR> 60, negative PCR for COVID, flu and RSV, UA (hazy appearance, small amount of leukocyte, rare bacteria, WBC 11-20), lactic acid of 1.7 --> 2.2 --> 1.5, troponin 217 --> 251.  Chest x-ray negative for infiltration.  EKG: Sinus rhythm, QTc 484, LAE, LAD, poor R wave progression, low voltage.   2/24: Blood pressure with some improvement to 97/63, patient was started on midodrine and stress doses of steroid.  Elevated leukocytosis today likely secondary to stress doses of steroid.  Preliminary blood cultures negative.  Urine cultures pending.  Troponin peaked at 251 and now trending down.  Family is very concerned about this recurrent UTI, patient has multiple hospitalization for this and has been seen outpatient urology, there were recommending continuing antibiotics at suppressive doses.  2/25: Vital stable.  Blood pressure seems within goal now.  Mild hypokalemia which are being repleted.  Blood cultures remain negative, urine cultures with no growth.  Stopping antibiotics.  Sepsis ruled out.  Increasing the dose of midodrine. Had an episode of sinus tachycardia, restarting home metoprolol and torsemide.  PT is  recommending SNF, not sure if she has any days left  2/26: Vital stable with blood pressure in low 100s.  Echo with EF of 35 to 40%, left ventricular global hypokinesis and severely dilated cavity, grade 1 diastolic dysfunction.  Small pericardial effusion. Patient currently stable to go back to her facility, she needs another insurance authorization which was started today.  She will go back once insurance authorization got approval.  Patient is high risk for readmission and mortality based on underlying comorbidities and poor functional status.  Palliative care was also consulted.  3/5: Patient with new leukocytosis, abdominal pain, watery diarrhea and upper respiratory symptoms.  Respiratory panel was checked and came back positive for RSV.  Patient was also recently started on pyridostigmine which can cause abdominal pain and diarrhea per ID, ordered CT abdomen to rule out any colitis.  Cardiology was also consulted and patient underwent cardiac MRI and right and left cardiac catheterization with following results Cardiac MRI:  1.  Small circumferential pericardial effusion. 2.  Mildly dilated LV with diffuse hypokinesis, EF 30%. 3.  Normal RV size with RV EF 38%. 4. Difficult delayed enhancement images, looked hard to null LV myocardium. Possible small area of mid-wall LGE in the mid inferior wall, nonspecific. 5.  Extracellular volume percentage significantly elevated at 41%.   RHC/LHC: 1. Low filling pressures.  2. Normal cardiac output.  3. 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  4. 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 She was also started on amiodarone for concern of SVT and  PVCs.  3/6: Febrile at 101.4 over the past 24-hour.  Patient with more upper respiratory symptoms.  Continue to have mild abdominal discomfort but mostly pointing towards epigastrium, loose  stools in colostomy bag.  Improving leukocytosis, C. difficile came back positive for antigen and negative for toxin-pending PCR.  GI pathogen panel and UA was also ordered and pending.  CT abdomen was negative for any concern of colitis or diverticulitis.  Chest x-ray normal.  Likely due to RSV  3/7: C. difficile PCR came back positive so started on p.o. vancomycin.  GI pathogen panel is also positive for EPEC-no need to treat per ID. Hemoglobin decreased to 7.7 without any obvious bleeding, FOBT was negative.  Anemia panel with low iron, TIBC was not calculated. Likely anemia of chronic disease with mild iron deficiency. UA with large leukocytes-urine cultures ordered Also developed mild hyponatremia-giving some normal saline  3/8: Overall stable with stable labs, hyponatremia improving.  Urine cultures pending.  3/9: Slowly improving respiratory symptoms.  Abdominal pain has been resolved with improving diarrhea.  Persistently low albumin so started on albumin infusion for 3 days after discussing with cardiology.  3/10: Remained hemodynamically stable, albumin at 1.9 and potassium of 3.3 today.  Pending insurance authorization for rehab.  Urine cultures with multiple species.  3/11: Patient remained stable and continued to improve.  Pending insurance authorization for rehab.  3/12: Still pending insurance authorization for rehab. Mild hypokalemia which is being repleted.  3/13: Remained hemodynamically stable, day 7 of p.o. vancomycin.  Insurance declined rehab even with peer to peer stating that she has very poor underlying functional status and she need either long-term care or assisted living.  TOC is looking for other options, not sure whether she has insurance for that or not.   Assessment and Plan: Secondary adrenal insufficiency (HCC) Blood pressure currently borderline soft Patient received stress doses of Solu-Cortef Continue home Cortef Increasing the dose of  midodrine  Abdominal pain RSV infection And developed  fever with leukocytosis overnight, leukocytosis started improving. Found to have RSV positive, C. difficile antigen and PCR positive, GI pathogen panel for E. Coli, UA with large leukocytes-pending cultures now -CT abdomen negative for diverticulitis or colitis. -Chest x-ray normal -ID was also consulted-started her on p.o. vancomycin -Supportive care  Myocardial injury Troponin peaked at 251 and then started trending down.  No chest pain.  Likely demand ischemia with concern of sepsis. -Echocardiogram EF of 35 to 40%, severe LV dilation,mild LVH, normal RV, mild MR, mild aortic stenosis.  Cardiac catheterization with concern of nonischemic cardiomyopathy -Continue aspirin 81 mg daily -Completed 48 hours of heparin infusion.  Chronic obstructive pulmonary disease (COPD) (HCC) No concern of exacerbation. -Continue home bronchodilators  Chronic pain syndrome -As needed Tylenol and oxycodone  HFrEF (heart failure with reduced ejection fraction) (HCC)  2D echo on 10/07/2022 showed EF of 40-45%.  Patient has 1+ leg edema, but no worsening shortness breath, does not seem to have CHF exacerbation.  BNP of 193 Repeat echo with EF of 35 to 40%, severe LV dilation,mild LVH, normal RV, mild MR, mild aortic stenosis.  Patient also underwent right and left cardiac catheterization which shows nonischemic cardiomyopathy. 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 -Cardiology is on board and recommending continue low-dose Toprol and spironolactone -Continue amiodarone  History of pulmonary embolus 2020(PE) Restarting home Eliquis  Depression with anxiety -Continue home meds  Obesity (BMI 30-39.9) Estimated body mass index is 39.34 kg/m as  calculated from the following:   Height as of this encounter: 5\' 8"  (1.727 m).   Weight as of this encounter: 117.3 kg.   -This will complicate overall  prognosis -Encouraged weight loss  Pressure injury of skin Pressure Injury 03/08/23 Ischial tuberosity Left Stage 2 -  Partial thickness loss of dermis presenting as a shallow open injury with a red, pink wound bed without slough. (Active)  03/08/23 1100  Location: Ischial tuberosity  Location Orientation: Left  Staging: Stage 2 -  Partial thickness loss of dermis presenting as a shallow open injury with a red, pink wound bed without slough.  Wound Description (Comments):   Present on Admission: Yes     Pressure Injury 03/08/23 Ischial tuberosity Right Stage 2 -  Partial thickness loss of dermis presenting as a shallow open injury with a red, pink wound bed without slough. (Active)  03/08/23 1100  Location: Ischial tuberosity  Location Orientation: Right  Staging: Stage 2 -  Partial thickness loss of dermis presenting as a shallow open injury with a red, pink wound bed without slough.  Wound Description (Comments):   Present on Admission: Yes   Present on admission     Subjective: Patient was seen and examined today.  No new concern.  Respiratory symptoms has been improved.  No more diarrhea.  Physical Exam: Vitals:   03/24/23 0805 03/24/23 2155 03/25/23 0459 03/25/23 0739  BP: (!) 95/58 (!) 99/57 (!) 96/59 103/70  Pulse: 91 87 86 86  Resp: 15 18 20 18   Temp: 98.5 F (36.9 C) 98.7 F (37.1 C) 98 F (36.7 C) 97.6 F (36.4 C)  TempSrc:  Oral    SpO2: 98% 98% 100% 98%  Weight:      Height:       General.  Frail and obese elderly lady, in no acute distress. Pulmonary.  Lungs clear bilaterally, normal respiratory effort. CV.  Regular rate and rhythm, no JVD, rub or murmur. Abdomen.  Soft, nontender, nondistended, BS positive. CNS.  Alert and oriented .  No focal neurologic deficit. Extremities.  No edema, no cyanosis, pulses intact and symmetrical. Psychiatry.  Judgment and insight appears normal.   Data Reviewed: Prior data reviewed  Family Communication: Discussed  with son on phone. Ordered maximum home health services.  Disposition: Status is: Inpatient Remains inpatient appropriate because: Severity of illness  Planned Discharge Destination: Skilled nursing facility  Time spent: 42 minutes  This record has been created using Conservation officer, historic buildings. Errors have been sought and corrected,but may not always be located. Such creation errors do not reflect on the standard of care.   Author: Arnetha Courser, MD 03/25/2023 12:31 PM  For on call review [email or web address].

## 2023-03-25 NOTE — Progress Notes (Signed)
 Physical Therapy Treatment Patient Details Name: Wendy Sanchez MRN: [id number] DOB: 1950-04-17 Today's Date: 03/25/2023   History of Present Illness Pt is a 73 y.o. female admitted with sepsis, adrenal insufficiency, NSTEMI and recurrent UTI. PMH significant for COPD, T2 compression fx, OA, CHF with EF of 40-45%, depression, obesity, anemia, DVT and PE on Eliquis, temporal arteritis, renal cell carcinoma, adrenal insufficiency, s/p for colostomy secondary to perforated diverticulitis.    PT Comments  Pt was long sitting in bed upon arrival. She is A and O x 4. Well aware of current situation and insurance denial for rehab. " My son and my daughter will be helping me." Discussed at length equipment recommendations and expectations at DC. Pt endorses having hoyer lift, w/c, RW, BSC (bariatric) and a adjusting bed (not hospital bed) already. Author attempted to call pt's son to confirm equipment and proper support at DC. Unable to reach pt's son. Pt was agreeable to session and remains pleasant and motivated however continues to require extensive assistance. Max assist to achieve EOB sitting and then max assist to return to supine after performing ther ex/seated exercises at EOB. Pt tolerated well but overall limited by fatigue. Acute PT will continue to follow and progress per current POC.    If plan is discharge home, recommend the following: Two people to help with walking and/or transfers;A lot of help with bathing/dressing/bathroom;Assistance with cooking/housework;Assistance with feeding;Direct supervision/assist for medications management;Direct supervision/assist for financial management;Assist for transportation;Help with stairs or ramp for entrance     Equipment Recommendations  Other (comment) (Defer to next level of care)       Precautions / Restrictions Precautions Precautions: Fall Recall of Precautions/Restrictions: Intact Precaution/Restrictions Comments: monitor HR, BP; R heart  cath 03/16/23 Restrictions Weight Bearing Restrictions Per Provider Order: No     Mobility  Bed Mobility Overal bed mobility: Needs Assistance Bed Mobility: Rolling Rolling: Max assist Supine to sit: Max assist, HOB elevated, Used rails Sit to supine: Max assist, HOB elevated, Used rails General bed mobility comments: Max assist to achieve EOB sitting. Sat EOB x ~ 12 minutes while performing ther ex and sitting balance.    Transfers  General transfer comment: pt fatigued with just sitting exercises at EOB. Recommend hoyer lift transfers for any/all OOB      Balance Overall balance assessment: Needs assistance Sitting-balance support: Feet supported Sitting balance-Leahy Scale: Fair Sitting balance - Comments: no LOB once balance achieve at EOB     Communication Communication Communication: No apparent difficulties  Cognition Arousal: Alert Behavior During Therapy: WFL for tasks assessed/performed   PT - Cognitive impairments: No apparent impairments    PT - Cognition Comments: Pt is A and O x4 Following commands: Intact      Cueing Cueing Techniques: Verbal cues     General Comments General comments (skin integrity, edema, etc.): lengthy discussion about DC disposition and rehab denial for  insurnace. pt aware is aware and seemed to be processing with well. Pt endorses having hoyer lift, w/c, and adjusting bed. Discussed urine/bowel management and pt was educated on home purwick system. Pt was able to perform BLE ther ex while seated EOB to promote strengthening and improve functional abilities.      Pertinent Vitals/Pain Pain Assessment Pain Assessment: 0-10 Pain Score: 3  Pain Location: B knees and buttocks Pain Descriptors / Indicators: Aching, Discomfort Pain Intervention(s): Limited activity within patient's tolerance, Monitored during session, Premedicated before session, Repositioned, Ice applied     PT Goals (current goals  can now be found in the care plan  section) Acute Rehab PT Goals Patient Stated Goal: to get better Progress towards PT goals: Progressing toward goals    Frequency    Min 1X/week       Co-evaluation     PT goals addressed during session: Mobility/safety with mobility        AM-PAC PT "6 Clicks" Mobility   Outcome Measure  Help needed turning from your back to your side while in a flat bed without using bedrails?: A Lot Help needed moving from lying on your back to sitting on the side of a flat bed without using bedrails?: A Lot Help needed moving to and from a bed to a chair (including a wheelchair)?: Total Help needed standing up from a chair using your arms (e.g., wheelchair or bedside chair)?: Total Help needed to walk in hospital room?: Total Help needed climbing 3-5 steps with a railing? : Total 6 Click Score: 8    End of Session   Activity Tolerance: Patient tolerated treatment well;Patient limited by fatigue Patient left: in bed;with call bell/phone within reach Nurse Communication: Mobility status PT Visit Diagnosis: Muscle weakness (generalized) (M62.81);Unsteadiness on feet (R26.81);Difficulty in walking, not elsewhere classified (R26.2)     Time: 1450-1511 PT Time Calculation (min) (ACUTE ONLY): 21 min  Charges:    $Therapeutic Activity: 8-22 mins PT General Charges $$ ACUTE PT VISIT: 1 Visit                     Jetta Lout PTA 03/25/23, 4:00 PM

## 2023-03-25 NOTE — Consult Note (Signed)
 WOC Nurse Consult Note: Reason for Consult: Follow HAPi stage 3 on sacrum and reapply Unna boot. Wound type: PI stage 3. Pressure Injury POA: No Measurement: 5 x 3.5 x 2 cm Wound bed: 60% red, 40% yellow slough. Drainage (amount, consistency, odor) Minimum amount. Periwound: intact Dressing procedure/placement/frequency: Apply Aquacel (#[id number]) on the wound bed, filling the dept. Cover with Foam dressing, change daily or PRN.  Bilateral Unna boot - Pt refuses to reapply. She don't want wrap her leg. She has compressive socks at home, recommended to use and explained all the risks to be without a compressive therapy.  The WOC team will be in touch tomorrow.  As an alternative for the bed nurse: Apply Kerlix and abd wrap since the base of the toes, until the knees.  WOC team will follow.   Please reconsult if further assistance is needed. Thank-you,  Denyse Amass BSN, RN, [employer], WOC  (Pager: [phone])

## 2023-03-25 NOTE — Progress Notes (Signed)
 Nutrition Follow-up  DOCUMENTATION CODES:   Non-severe (moderate) malnutrition in context of chronic illness, Obesity unspecified  INTERVENTION:   -Continue 2 gram sodium diet -Continue Glucerna Shake po BID, each supplement provides 220 kcal and 10 grams of protein  -Continue Ensure Max po daily, each supplement provides 150 kcal and 30 grams of protein.   -Continue 2 mg copper daily x 60 days -Continue 200 mg zinc sulfate daily x 30 days  -Continue 500 mg vitamin C BID -Continue MVI with minerals daily -Additional beverages added to meal trays per pt request  NUTRITION DIAGNOSIS:   Moderate Malnutrition related to chronic illness (CHF, COPD, adrenal insufficiency) as evidenced by moderate fat depletion, severe fat depletion, mild muscle depletion, moderate muscle depletion, edema, percent weight loss.  Ongoing  GOAL:   Patient will meet greater than or equal to 90% of their needs  Progressing   MONITOR:   PO intake, Supplement acceptance  REASON FOR ASSESSMENT:   Consult Assessment of nutrition requirement/status  ASSESSMENT:   Pt with medical history significant of COPD, CHF with EF of 40-45%, depression, obesity, anemia, DVT and PE on Eliquis, temporal arteritis, renal cell carcinoma, adrenal insufficiency, s/p for colostomy secondary to perforated diverticulitis, who presents with fever, increased urinary frequency.  Reviewed I/O's: -1.3 L x 24 hours and -12.6 L since 03/11/23  UOP: 2 L x 24 hours  Spoke with pt at bedside, who was pleasant and in good spirits today. Pt reports feeling better and more interactive with this RD in comparison to previous visit. Pt reports good appetite and is tolerating meals well. Noted meal completions 100%. Per pt, she often needs to drink a lot of fluids with meals "to help the food go down" and feels like she is not getting enough beverages with her meals. Pt shares that she has been on a mechanically altered diet before and this  did not help; RD addend additional beverages (water and juice) to meal trays per her request.   Pt reports good compliance with supplements. Pt shares she can only drink Boost "every 3 days" and Ensure "runs through my colostomy bag". Pt has been drinking Glucerna and indicates that she tolerates this well and is willing to continue supplements. Discussed importance of good meal and supplement intake to promote healing.   Wt has been stable over the past week.   Per TOC notes, plan for SNF placement at discharge; awaiting insurance authorization.   Medications reviewed and include vitamin C, calcium carbonate, vitamin D3, copper, vitamin B-12, folic acid, protonix, aldactone, demadex, vitamin A, and zinc sulfate.   Labs reviewed: CBGS: 114. Vitamin A: 9.4 (02/01/23), Zinc: 29 (02/01/23), copper: 29 (02/01/23), iron: 16 (02/01/23)  Vitamin K, vitamin E,  vitamin D, thiamine, and folate WDL . Vitamin B-12: 1780.   Diet Order:   Diet Order             Diet 2 gram sodium Room service appropriate? Yes; Fluid consistency: Thin  Diet effective now                   EDUCATION NEEDS:   Education needs have been addressed  Skin:  Skin Assessment: Skin Integrity Issues: Skin Integrity Issues:: Stage II Stage II: rt and lt ischial tuberosity  Last BM:  03/24/23 (via colostomy)  Height:   Ht Readings from Last 1 Encounters:  03/07/23 5\' 8"  (1.727 m)    Weight:   Wt Readings from Last 1 Encounters:  03/23/23 119.1 kg  Ideal Body Weight:  63.6 kg  BMI:  Body mass index is 39.92 kg/m.  Estimated Nutritional Needs:   Kcal:  1900-2100  Protein:  100-115 grams  Fluid:  > 1.9 L    Levada Schilling, RD, LDN, CDCES Registered Dietitian III Certified Diabetes Care and Education Specialist If unable to reach this RD, please use "RD Inpatient" group chat on secure chat between hours of 8am-4 pm daily

## 2023-03-25 NOTE — TOC Progression Note (Signed)
 Transition of Care [facility]) - Progression Note    Patient Details  Name: Wendy Sanchez MRN: [id number] Date of Birth: 1950/10/29  Transition of Care [facility]) CM/SW Contact  Truddie Hidden, RN Phone Number: 03/25/2023, 11:02 AM  Clinical Narrative:    Per [employer] rep, Alfredo Batty, a peer to peer is being offered due by 3pm today. MD can call [phone] option [phone]. MD will need to provide patient's name, DOB, and member ID # [id number].  MD notified.         Expected Discharge Plan: Skilled Nursing Facility Barriers to Discharge: Continued Medical Work up  Expected Discharge Plan and Services     Post Acute Care Choice: Skilled Nursing Facility Living arrangements for the past 2 months: Skilled Nursing Facility                                       Social Determinants of Health (SDOH) Interventions SDOH Screenings   Food Insecurity: No Food Insecurity (03/12/2023)  Housing: Low Risk  (03/12/2023)  Transportation Needs: Unmet Transportation Needs (03/12/2023)  Utilities: Not At Risk (03/08/2023)  Depression (PHQ2-9): Low Risk  (07/01/2022)  Financial Resource Strain: Low Risk  (12/26/2020)   Received from [facility], [facility]  Social Connections: Unknown (03/08/2023)  Tobacco Use: High Risk (03/12/2023)    Readmission Risk Interventions    03/08/2023   11:09 AM 02/03/2021    9:12 AM  Readmission Risk Prevention Plan  Transportation Screening Complete Complete  Medication Review (RN Care Manager) Complete Complete  PCP or Specialist appointment within 3-5 days of discharge Complete Complete  HRI or Home Care Consult  Complete  SW Recovery Care/Counseling Consult Complete   Palliative Care Screening Not Applicable Not Applicable  Skilled Nursing Facility Complete Complete

## 2023-03-26 DIAGNOSIS — I9589 Other hypotension: Secondary | ICD-10-CM | POA: Diagnosis not present

## 2023-03-26 LAB — BASIC METABOLIC PANEL
Anion gap: 4 — ABNORMAL LOW (ref 5–15)
BUN: 17 mg/dL (ref 8–23)
CO2: 30 mmol/L (ref 22–32)
Calcium: 7.8 mg/dL — ABNORMAL LOW (ref 8.9–10.3)
Chloride: 98 mmol/L (ref 98–111)
Creatinine, Ser: 0.83 mg/dL (ref 0.44–1.00)
GFR, Estimated: >60 mL/min (ref >60)
Glucose, Bld: 117 mg/dL — ABNORMAL HIGH (ref 70–99)
Potassium: 3.7 mmol/L (ref 3.5–5.1)
Sodium: 132 mmol/L — ABNORMAL LOW (ref 135–145)

## 2023-03-26 LAB — RENAL FUNCTION PANEL
Albumin: 1.9 g/dL — ABNORMAL LOW (ref 3.5–5.0)
Anion gap: 10 (ref 5–15)
BUN: 17 mg/dL (ref 8–23)
CO2: 29 mmol/L (ref 22–32)
Calcium: 8.4 mg/dL — ABNORMAL LOW (ref 8.9–10.3)
Chloride: 99 mmol/L (ref 98–111)
Creatinine, Ser: 0.75 mg/dL (ref 0.44–1.00)
GFR, Estimated: >60 mL/min (ref >60)
Glucose, Bld: 96 mg/dL (ref 70–99)
Phosphorus: 3.4 mg/dL (ref 2.5–4.6)
Potassium: 3.9 mmol/L (ref 3.5–5.1)
Sodium: 138 mmol/L (ref 135–145)

## 2023-03-26 NOTE — Assessment & Plan Note (Signed)
 Continue home meds

## 2023-03-26 NOTE — TOC Progression Note (Signed)
 Transition of Care [facility]) - Progression Note    Patient Details  Name: Wendy Sanchez MRN: [id number] Date of Birth: 1951/01/13  Transition of Care [facility]) CM/SW Contact  Erin Sons, [place] Phone Number: 03/26/2023, 3:28 PM  Clinical Narrative:     TOC was notified that Appeal was denied. pt financial responsibility starts 03/27/23 at 12noon.  CSW called pt's son; no answer and voicemail not available. CSW called pt's sister and updated her. She is agreeable to DC tomorrow around 11am. They will need EMS transport home.   CSW attempted to arrange [facility] PT/OT/RN/Aide. The following could not staff referral or did not have an RN: [facility]  CSW is able to arrange [facility] PT/OT/Aide with [facility].  EMS is scheduled for 11am tomorrow; transport forms are on the chart. RN and attending have been updated.    Expected Discharge Plan: Home w Home Health Services Barriers to Discharge: Other (must enter comment) (appealing SNF denial)  Expected Discharge Plan and Services     Post Acute Care Choice: Skilled Nursing Facility Living arrangements for the past 2 months: Skilled Nursing Facility                                       Social Determinants of Health (SDOH) Interventions SDOH Screenings   Food Insecurity: No Food Insecurity (03/12/2023)  Housing: Low Risk  (03/12/2023)  Transportation Needs: Unmet Transportation Needs (03/12/2023)  Utilities: Not At Risk (03/08/2023)  Depression (PHQ2-9): Low Risk  (07/01/2022)  Financial Resource Strain: Low Risk  (12/26/2020)   Received from [facility], [facility]  Social Connections: Unknown (03/08/2023)  Tobacco Use: High Risk (03/12/2023)    Readmission Risk Interventions    03/08/2023   11:09 AM 02/03/2021    9:12 AM  Readmission Risk Prevention Plan  Transportation Screening Complete Complete  Medication Review (RN Care Manager) Complete Complete  PCP or Specialist appointment  within 3-5 days of discharge Complete Complete  HRI or Home Care Consult  Complete  SW Recovery Care/Counseling Consult Complete   Palliative Care Screening Not Applicable Not Applicable  Skilled Nursing Facility Complete Complete

## 2023-03-26 NOTE — Progress Notes (Signed)
 Occupational Therapy Treatment Patient Details Name: Wendy Sanchez MRN: [id number] DOB: 22-Jun-1950 Today's Date: 03/26/2023   History of present illness Pt is a 73 y.o. female admitted with sepsis, adrenal insufficiency, NSTEMI and recurrent UTI. PMH significant for COPD, T2 compression fx, OA, CHF with EF of 40-45%, depression, obesity, anemia, DVT and PE on Eliquis, temporal arteritis, renal cell carcinoma, adrenal insufficiency, s/p for colostomy secondary to perforated diverticulitis.   OT comments  Ms Murdy was seen for OT treatment on this date. Upon arrival to room pt in bed, agreeable to tx. Pt requires MAX A sup<>sit. SETUP + SUPERVISION seated grooming tasks. Tolerates ~10 min sitting prior to reporting fatigue and sacral pain requesting to return to bed. Reviewed HEP and DME recs, pt reports hoyer does not fit under her bed at home. Pt making progress toward goals, will continue to follow POC. Discharge recommendation remains appropriate.        If plan is discharge home, recommend the following:  Two people to help with walking and/or transfers;Two people to help with bathing/dressing/bathroom;Assistance with cooking/housework   Equipment Recommendations  Other (comment)    Recommendations for Other Services      Precautions / Restrictions Precautions Precautions: Fall Recall of Precautions/Restrictions: Intact Precaution/Restrictions Comments: monitor HR, BP Restrictions Weight Bearing Restrictions Per Provider Order: No       Mobility Bed Mobility Overal bed mobility: Needs Assistance Bed Mobility: Supine to Sit, Sit to Supine     Supine to sit: Max assist, HOB elevated, Used rails Sit to supine: Max assist        Transfers                   General transfer comment: pt fatigued with just sitting exercises at EOB. Recommend hoyer lift transfers for any/all OOB     Balance Overall balance assessment: Needs assistance Sitting-balance support: Feet  supported Sitting balance-Leahy Scale: Fair                                     ADL either performed or assessed with clinical judgement   ADL Overall ADL's : Needs assistance/impaired                                       General ADL Comments: SETUP + SUPERVISION seated grooming tasks.     Communication Communication Communication: No apparent difficulties   Cognition Arousal: Alert Behavior During Therapy: WFL for tasks assessed/performed Cognition: No apparent impairments                               Following commands: Intact        Cueing   Cueing Techniques: Verbal cues  Exercises      Shoulder Instructions       General Comments      Pertinent Vitals/ Pain       Pain Assessment Pain Assessment: Faces Faces Pain Scale: Hurts even more Pain Location: sacral wound Pain Descriptors / Indicators: Discomfort, Grimacing Pain Intervention(s): Limited activity within patient's tolerance, Repositioned   Frequency  Min 1X/week        Progress Toward Goals  OT Goals(current goals can now be found in the care plan section)  Progress towards OT goals: Progressing toward goals  Acute Rehab OT Goals OT Goal Formulation: With patient Time For Goal Achievement: 04/19/23 Potential to Achieve Goals: Fair ADL Goals Pt Will Perform Grooming: sitting;with set-up Pt Will Perform Upper Body Bathing: sitting;with min assist Pt Will Perform Lower Body Dressing: with min assist;sitting/lateral leans;with adaptive equipment;sit to/from stand Pt Will Transfer to Toilet: bedside commode;with max assist;stand pivot transfer Pt Will Perform Toileting - Clothing Manipulation and hygiene: with min assist;sit to/from stand;sitting/lateral leans  Plan      Co-evaluation                 AM-PAC OT "6 Clicks" Daily Activity     Outcome Measure   Help from another person eating meals?: None Help from another person taking  care of personal grooming?: A Little Help from another person toileting, which includes using toliet, bedpan, or urinal?: Total Help from another person bathing (including washing, rinsing, drying)?: A Lot Help from another person to put on and taking off regular upper body clothing?: A Lot Help from another person to put on and taking off regular lower body clothing?: Total 6 Click Score: 13    End of Session    OT Visit Diagnosis: Muscle weakness (generalized) (M62.81);Other abnormalities of gait and mobility (R26.89);Unsteadiness on feet (R26.81)   Activity Tolerance Patient tolerated treatment well   Patient Left in bed;with call bell/phone within reach;with bed alarm set   Nurse Communication          Time: 1610-9604 OT Time Calculation (min): 23 min  Charges: OT General Charges $OT Visit: 1 Visit OT Treatments $Self Care/Home Management : 23-37 mins  Kathie Dike, M.S. OTR/L  03/26/23, 1:21 PM  ascom [phone]

## 2023-03-26 NOTE — Progress Notes (Signed)
 Progress Note   Patient: Wendy Sanchez ZOX:[id number] DOB: 25-Nov-1950 DOA: 03/07/2023     19 DOS: the patient was seen and examined on 03/26/2023   Brief hospital course: Taken from H&P  Wendy Sanchez is a 73 y.o. female with medical history significant of COPD, CHF with EF of 40-45%, depression, obesity, anemia, DVT and PE on Eliquis, temporal arteritis, renal cell carcinoma, adrenal insufficiency, s/p for colostomy secondary to perforated diverticulitis, who presents with fever, increased urinary frequency.   Patient was found to have hypotension with blood pressure 86/59, which improved to 101/70 after giving 2 L LR bolus in ED, but blood pressure has dropped again to 82/61 with MAP of 69 when I saw pt in ED. Her mental status is normal.  pt was found to have WBC 10.3, GFR> 60, negative PCR for COVID, flu and RSV, UA (hazy appearance, small amount of leukocyte, rare bacteria, WBC 11-20), lactic acid of 1.7 --> 2.2 --> 1.5, troponin 217 --> 251.  Chest x-ray negative for infiltration.  EKG: Sinus rhythm, QTc 484, LAE, LAD, poor R wave progression, low voltage.   2/24: Blood pressure with some improvement to 97/63, patient was started on midodrine and stress doses of steroid.  Elevated leukocytosis today likely secondary to stress doses of steroid.  Preliminary blood cultures negative.  Urine cultures pending.  Troponin peaked at 251 and now trending down.  Family is very concerned about this recurrent UTI, patient has multiple hospitalization for this and has been seen outpatient urology, there were recommending continuing antibiotics at suppressive doses.  2/25: Vital stable.  Blood pressure seems within goal now.  Mild hypokalemia which are being repleted.  Blood cultures remain negative, urine cultures with no growth.  Stopping antibiotics.  Sepsis ruled out.  Increasing the dose of midodrine. Had an episode of sinus tachycardia, restarting home metoprolol and torsemide.  PT is  recommending SNF, not sure if she has any days left  2/26: Vital stable with blood pressure in low 100s.  Echo with EF of 35 to 40%, left ventricular global hypokinesis and severely dilated cavity, grade 1 diastolic dysfunction.  Small pericardial effusion. Patient currently stable to go back to her facility, she needs another insurance authorization which was started today.  She will go back once insurance authorization got approval.  Patient is high risk for readmission and mortality based on underlying comorbidities and poor functional status.  Palliative care was also consulted.  3/5: Patient with new leukocytosis, abdominal pain, watery diarrhea and upper respiratory symptoms.  Respiratory panel was checked and came back positive for RSV.  Patient was also recently started on pyridostigmine which can cause abdominal pain and diarrhea per ID, ordered CT abdomen to rule out any colitis.  Cardiology was also consulted and patient underwent cardiac MRI and right and left cardiac catheterization with following results Cardiac MRI:  1.  Small circumferential pericardial effusion. 2.  Mildly dilated LV with diffuse hypokinesis, EF 30%. 3.  Normal RV size with RV EF 38%. 4. Difficult delayed enhancement images, looked hard to null LV myocardium. Possible small area of mid-wall LGE in the mid inferior wall, nonspecific. 5.  Extracellular volume percentage significantly elevated at 41%.   RHC/LHC: 1. Low filling pressures.  2. Normal cardiac output.  3. 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  4. 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 She was also started on amiodarone for concern of SVT and  PVCs.  3/6: Febrile at 101.4 over the past 24-hour.  Patient with more upper respiratory symptoms.  Continue to have mild abdominal discomfort but mostly pointing towards epigastrium, loose  stools in colostomy bag.  Improving leukocytosis, C. difficile came back positive for antigen and negative for toxin-pending PCR.  GI pathogen panel and UA was also ordered and pending.  CT abdomen was negative for any concern of colitis or diverticulitis.  Chest x-ray normal.  Likely due to RSV  3/7: C. difficile PCR came back positive so started on p.o. vancomycin.  GI pathogen panel is also positive for EPEC-no need to treat per ID. Hemoglobin decreased to 7.7 without any obvious bleeding, FOBT was negative.  Anemia panel with low iron, TIBC was not calculated. Likely anemia of chronic disease with mild iron deficiency. UA with large leukocytes-urine cultures ordered Also developed mild hyponatremia-giving some normal saline  3/8: Overall stable with stable labs, hyponatremia improving.  Urine cultures pending.  3/9: Slowly improving respiratory symptoms.  Abdominal pain has been resolved with improving diarrhea.  Persistently low albumin so started on albumin infusion for 3 days after discussing with cardiology.  3/10: Remained hemodynamically stable, albumin at 1.9 and potassium of 3.3 today.  Pending insurance authorization for rehab.  Urine cultures with multiple species.  3/11: Patient remained stable and continued to improve.  Pending insurance authorization for rehab.  3/12: Still pending insurance authorization for rehab. Mild hypokalemia which is being repleted.  3/13: Remained hemodynamically stable, day 7 of p.o. vancomycin.  Insurance declined rehab even with peer to peer stating that she has very poor underlying functional status and she need either long-term care or assisted living.  TOC is looking for other options, not sure whether she has insurance for that or not.  Assessment and Plan: * Hypotension Improved, but remains low. Patient is asymptomatic. Prior to admission the patient was on metoprolol and midodrine. Metoprolol has been discontinued, but midodrine has been  up-titrated to 15 mg tid. Diuresis by way of torsemide has been pursued as inpatient. She is currently on Torsemide 20 mg daily.  Secondary adrenal insufficiency (HCC) Blood pressure currently borderline soft Patient received stress doses of Solu-Cortef, and has been continued on home doses of hydrocortisone.  Abdominal pain RSV infection - Noted. C. difficile antigen and PCR positive, GI pathogen panel for E. Coli. UA with large leukocytes. Urine cultures are multimicrobial. -CT abdomen negative for diverticulitis or colitis. -Chest x-ray normal -ID was also consulted-started her on p.o. vancomycin -Supportive care  Myocardial injury Troponin peaked at 251 and then started trending down.  No chest pain.  Likely demand ischemia with concern of sepsis. -Echocardiogram EF of 35 to 40%, severe LV dilation,mild LVH, normal RV, mild MR, mild aortic stenosis - new finding.  Cardiac catheterization with concern of nonischemic cardiomyopathy -Continue aspirin 81 mg daily -Completed 48 hours of heparin infusion.  Chronic obstructive pulmonary disease (COPD) (HCC) No concern of exacerbation. -Continue home bronchodilators  Chronic pain syndrome -As needed Tylenol and oxycodone  HFrEF (heart failure with reduced ejection fraction) (HCC)  2D echo on 10/07/2022 showed EF of 40-45%.  Patient has 1+ leg edema, but no worsening shortness breath, does not seem to have CHF exacerbation.  BNP of 193 Repeat echo with EF of 35 to 40%, severe LV dilation,mild LVH, normal RV, mild MR, mild aortic stenosis.  Patient also underwent right and left cardiac catheterization which shows nonischemic cardiomyopathy. Cardiac MRI concern of cardiac amyloidosis which need for further workup  as outpatient. -Cardiology is on board and recommending continue low-dose Toprol and spironolactone -Continue amiodarone  History of pulmonary embolus 2020(PE) Continue home Eliquis.  Depression with anxiety -Continue home  meds  Obesity (BMI 30-39.9) Estimated body mass index is 14.55 kg/m as calculated from the following:   Height as of this encounter: 5\' 8"  (1.727 m).   Weight as of this encounter: 43.4 kg.   -This will complicate overall prognosis -Encouraged sensible weight loss through activity and dietary modification.  Malnutrition of moderate degree Nutrition consulted.   C. difficile colitis Stools have slowed down considerably. The patient is continued on oral vancomycin as advised by ID.  RSV/bronchiolitis Treated with nebulizer treatments, hydrocortisone.  SVT (supraventricular tachycardia) (HCC) Resolved.  Nonischemic cardiomyopathy (HCC) Left and right heart cath demonstrated low filling pressures, normal cardiac output, minimal coronary disease, nonischemic cardiomyopathy (concern for cardiac amyloidosis), mild aortic stenosis.  Sepsis (HCC) Sepsis with RR 24, HR 120, BP 91/52, and lactic acidosis with perforated diverticulitis upon admission. Treated and resolved.   Pressure injury of skin Pressure Injury 03/08/23 Ischial tuberosity Left Stage 2 -  Partial thickness loss of dermis presenting as a shallow open injury with a red, pink wound bed without slough. (Active)  03/08/23 1100  Location: Ischial tuberosity  Location Orientation: Left  Staging: Stage 2 -  Partial thickness loss of dermis presenting as a shallow open injury with a red, pink wound bed without slough.  Wound Description (Comments):   Present on Admission: Yes     Pressure Injury 03/08/23 Ischial tuberosity Right Stage 2 -  Partial thickness loss of dermis presenting as a shallow open injury with a red, pink wound bed without slough. (Active)  03/08/23 1100  Location: Ischial tuberosity  Location Orientation: Right  Staging: Stage 2 -  Partial thickness loss of dermis presenting as a shallow open injury with a red, pink wound bed without slough.  Wound Description (Comments):   Present on Admission: Yes      Pressure Injury 03/23/23 Sacrum Medial Stage 3 -  Full thickness tissue loss. Subcutaneous fat may be visible but bone, tendon or muscle are NOT exposed. slough present, red, pink (Active)  03/23/23 2130  Location: Sacrum  Location Orientation: Medial  Staging: Stage 3 -  Full thickness tissue loss. Subcutaneous fat may be visible but bone, tendon or muscle are NOT exposed.  Wound Description (Comments): slough present, red, pink  Present on Admission:    Present on admission        Subjective: The patient is resting comfortably. No new complaints.   Physical Exam: Vitals:   03/25/23 2105 03/26/23 0448 03/26/23 0720 03/26/23 0752  BP: 116/67 98/64  (!) 93/56  Pulse: 94 84  85  Resp: 17 17  16   Temp: 98.6 F (37 C) 98.4 F (36.9 C)  98.2 F (36.8 C)  TempSrc: Oral     SpO2: 100% 98%  100%  Weight:   43.4 kg   Height:       Exam:  Constitutional:  The patient is awake, alert, and oriented x 3. No acute distress. Respiratory:  No increased work of breathing. Small wheezes throughout. No rales or rhonchi. No tactile fremitus Cardiovascular:  Regular rate and rhythm No murmurs, ectopy, or gallups. No lateral PMI. No thrills. Abdomen:  Abdomen is soft, non-tender, non-distended No hernias, masses, or organomegaly Normoactive bowel sounds.  Musculoskeletal:  No cyanosis, clubbing, or edema Skin:  No rashes, lesions, ulcers palpation of skin: no induration or  nodules Neurologic:  CN 2-12 intact Sensation all 4 extremities intact Psychiatric:  Mental status Mood, affect appropriate Orientation to person, place, time  judgment and insight appear intact  Data Reviewed:  BMP  Family Communication: None available  Disposition: Status is: Inpatient Remains inpatient appropriate because: Awaiting safe discharge.  Planned Discharge Destination: Home with Home Health    Time spent: 34 minutes  Author: Tayelor Osborne, DO 03/26/2023 4:11 PM  For on call  review [email or web address].

## 2023-03-26 NOTE — Assessment & Plan Note (Signed)
 Stools have slowed down considerably. The patient is continued on oral vancomycin as advised by ID.

## 2023-03-26 NOTE — Plan of Care (Signed)

## 2023-03-26 NOTE — Assessment & Plan Note (Signed)
 Resolved

## 2023-03-26 NOTE — Assessment & Plan Note (Signed)
 Left and right heart cath demonstrated low filling pressures, normal cardiac output, minimal coronary disease, nonischemic cardiomyopathy (concern for cardiac amyloidosis), mild aortic stenosis.

## 2023-03-26 NOTE — Assessment & Plan Note (Signed)
-  As needed Tylenol and oxycodone

## 2023-03-26 NOTE — Assessment & Plan Note (Signed)
 Estimated body mass index is 14.55 kg/m as calculated from the following:   Height as of this encounter: 5\' 8"  (1.727 m).   Weight as of this encounter: 43.4 kg.   -This will complicate overall prognosis -Encouraged sensible weight loss through activity and dietary modification.

## 2023-03-26 NOTE — Assessment & Plan Note (Addendum)
 Improved, but remains low. Patient is asymptomatic. Prior to admission the patient was on metoprolol and midodrine. Metoprolol has been discontinued, but midodrine has been up-titrated to 15 mg tid. Diuresis by way of torsemide has been pursued as inpatient. She is currently on Torsemide 20 mg daily.

## 2023-03-26 NOTE — Assessment & Plan Note (Signed)
 Sepsis with RR 24, HR 120, BP 91/52, and lactic acidosis with perforated diverticulitis upon admission. Treated and resolved.

## 2023-03-26 NOTE — Assessment & Plan Note (Signed)
 No concern of exacerbation. -Continue home bronchodilators

## 2023-03-26 NOTE — Assessment & Plan Note (Signed)
 Treated with nebulizer treatments, hydrocortisone.

## 2023-03-26 NOTE — Assessment & Plan Note (Signed)
 Pressure Injury 03/08/23 Ischial tuberosity Left Stage 2 -  Partial thickness loss of dermis presenting as a shallow open injury with a red, pink wound bed without slough. (Active)  03/08/23 1100  Location: Ischial tuberosity  Location Orientation: Left  Staging: Stage 2 -  Partial thickness loss of dermis presenting as a shallow open injury with a red, pink wound bed without slough.  Wound Description (Comments):   Present on Admission: Yes     Pressure Injury 03/08/23 Ischial tuberosity Right Stage 2 -  Partial thickness loss of dermis presenting as a shallow open injury with a red, pink wound bed without slough. (Active)  03/08/23 1100  Location: Ischial tuberosity  Location Orientation: Right  Staging: Stage 2 -  Partial thickness loss of dermis presenting as a shallow open injury with a red, pink wound bed without slough.  Wound Description (Comments):   Present on Admission: Yes     Pressure Injury 03/23/23 Sacrum Medial Stage 3 -  Full thickness tissue loss. Subcutaneous fat may be visible but bone, tendon or muscle are NOT exposed. slough present, red, pink (Active)  03/23/23 2130  Location: Sacrum  Location Orientation: Medial  Staging: Stage 3 -  Full thickness tissue loss. Subcutaneous fat may be visible but bone, tendon or muscle are NOT exposed.  Wound Description (Comments): slough present, red, pink  Present on Admission:    Present on admission

## 2023-03-26 NOTE — Assessment & Plan Note (Signed)
 Nutrition consulted.

## 2023-03-26 NOTE — Consult Note (Addendum)
 WOC Nurse wound follow up Refer to previous WOC consult note from 3/13.  Pt had refused [employer] application at that time.  Secure chat message received from bedside nurse that the patient is now in agreement. Secure chat message sent as follows to the bedside nurse: "The WOC nurse was at [facility] this morning and no one is available to come back this afternoon, so our team will re-apply on Mon."   Thank-you Cammie Mcgee MSN, RN, [place], [place], CNS [phone]

## 2023-03-27 DIAGNOSIS — I9589 Other hypotension: Secondary | ICD-10-CM | POA: Diagnosis not present

## 2023-03-27 LAB — CBC
HCT: 28 % — ABNORMAL LOW (ref 36.0–46.0)
Hemoglobin: 9 g/dL — ABNORMAL LOW (ref 12.0–15.0)
MCH: 27.5 pg (ref 26.0–34.0)
MCHC: 32.1 g/dL (ref 30.0–36.0)
MCV: 85.6 fL (ref 80.0–100.0)
Platelets: 595 10*3/uL — ABNORMAL HIGH (ref 150–400)
RBC: 3.27 MIL/uL — ABNORMAL LOW (ref 3.87–5.11)
RDW: 16.3 % — ABNORMAL HIGH (ref 11.5–15.5)
WBC: 11.2 10*3/uL — ABNORMAL HIGH (ref 4.0–10.5)
nRBC: 0 % (ref 0.0–0.2)

## 2023-03-27 LAB — RENAL FUNCTION PANEL
Albumin: 2 g/dL — ABNORMAL LOW (ref 3.5–5.0)
Anion gap: 7 (ref 5–15)
BUN: 18 mg/dL (ref 8–23)
CO2: 31 mmol/L (ref 22–32)
Calcium: 7.9 mg/dL — ABNORMAL LOW (ref 8.9–10.3)
Chloride: 96 mmol/L — ABNORMAL LOW (ref 98–111)
Creatinine, Ser: 0.76 mg/dL (ref 0.44–1.00)
GFR, Estimated: >60 mL/min (ref >60)
Glucose, Bld: 82 mg/dL (ref 70–99)
Phosphorus: 3.7 mg/dL (ref 2.5–4.6)
Potassium: 3.4 mmol/L — ABNORMAL LOW (ref 3.5–5.1)
Sodium: 134 mmol/L — ABNORMAL LOW (ref 135–145)

## 2023-03-27 MED ORDER — PYRIDOSTIGMINE BROMIDE 60 MG PO TABS: 30.0000 mg | ORAL_TABLET | Freq: Two times a day (BID) | ORAL | 0 refills | Status: DC

## 2023-03-27 MED ORDER — AMIODARONE HCL 200 MG PO TABS: 200.0000 mg | ORAL_TABLET | Freq: Every day | ORAL | 0 refills | Status: DC

## 2023-03-27 MED ORDER — METOPROLOL SUCCINATE ER 25 MG PO TB24: 12.5000 mg | ORAL_TABLET | Freq: Every day | ORAL | 0 refills | Status: DC

## 2023-03-27 MED ORDER — POTASSIUM CHLORIDE CRYS ER 20 MEQ PO TBCR: 40.0000 meq | EXTENDED_RELEASE_TABLET | Freq: Once | ORAL | 0 refills | Status: AC

## 2023-03-27 MED ORDER — ADULT MULTIVITAMIN W/MINERALS CH: 1.0000 | ORAL_TABLET | Freq: Every day | ORAL | 0 refills | Status: DC

## 2023-03-27 MED ORDER — VANCOMYCIN HCL 125 MG PO CAPS: 125.0000 mg | ORAL_CAPSULE | Freq: Four times a day (QID) | ORAL | 0 refills | Status: AC

## 2023-03-27 MED ORDER — POTASSIUM CHLORIDE CRYS ER 20 MEQ PO TBCR
40.0000 meq | EXTENDED_RELEASE_TABLET | Freq: Once | ORAL | Status: AC
  Administered 2023-03-27: 40 meq via ORAL
  Filled 2023-03-27: qty 2

## 2023-03-27 MED ORDER — SPIRONOLACTONE 25 MG PO TABS: 12.5000 mg | ORAL_TABLET | Freq: Every day | ORAL | 0 refills | Status: DC

## 2023-03-27 NOTE — Discharge Summary (Addendum)
 Physician Discharge Summary   Patient: Wendy Sanchez MRN: [id number] DOB: 03/17/1950  Admit date:     03/07/2023  Discharge date: 03/27/23  Discharge Physician: Fran Lowes   PCP: Pcp, No   Recommendations at discharge:   Discharge to facility Complete PO vancomycin on 03/28/2023 Follow up with cardiology as directed. Follow up with PCP in 7-10 days. Discharge Diagnoses: Principal Problem:   Hypotension Active Problems:   Secondary adrenal insufficiency (HCC)   Myocardial injury   Abdominal pain   Chronic obstructive pulmonary disease (COPD) (HCC)   Chronic pain syndrome   HFrEF (heart failure with reduced ejection fraction) (HCC)   History of pulmonary embolus 2020(PE)   Depression with anxiety   Obesity (BMI 30-39.9)   Pressure injury of skin   Sepsis (HCC)   Nonischemic cardiomyopathy (HCC)   Acute on chronic HFrEF (heart failure with reduced ejection fraction) (HCC)   SVT (supraventricular tachycardia) (HCC)   RSV/bronchiolitis   C. difficile colitis   Malnutrition of moderate degree  Resolved Problems:   * No resolved hospital problems. *  Hospital Course: Taken from H&P  ROSANGELICA Sanchez is a 73 y.o. female with medical history significant of COPD, CHF with EF of 40-45%, depression, obesity, anemia, DVT and PE on Eliquis, temporal arteritis, renal cell carcinoma, adrenal insufficiency, s/p for colostomy secondary to perforated diverticulitis, who presents with fever, increased urinary frequency.   Patient was found to have hypotension with blood pressure 86/59, which improved to 101/70 after giving 2 L LR bolus in ED, but blood pressure has dropped again to 82/61 with MAP of 69 when I saw pt in ED. Her mental status is normal.  pt was found to have WBC 10.3, GFR> 60, negative PCR for COVID, flu and RSV, UA (hazy appearance, small amount of leukocyte, rare bacteria, WBC 11-20), lactic acid of 1.7 --> 2.2 --> 1.5, troponin 217 --> 251.  Chest x-ray negative for  infiltration.  EKG: Sinus rhythm, QTc 484, LAE, LAD, poor R wave progression, low voltage.   2/24: Blood pressure with some improvement to 97/63, patient was started on midodrine and stress doses of steroid.  Elevated leukocytosis today likely secondary to stress doses of steroid.  Preliminary blood cultures negative.  Urine cultures pending.  Troponin peaked at 251 and now trending down.  Family is very concerned about this recurrent UTI, patient has multiple hospitalization for this and has been seen outpatient urology, there were recommending continuing antibiotics at suppressive doses.  2/25: Vital stable.  Blood pressure seems within goal now.  Mild hypokalemia which are being repleted.  Blood cultures remain negative, urine cultures with no growth.  Stopping antibiotics.  Sepsis ruled out.  Increasing the dose of midodrine. Had an episode of sinus tachycardia, restarting home metoprolol and torsemide.  PT is recommending SNF, not sure if she has any days left  2/26: Vital stable with blood pressure in low 100s.  Echo with EF of 35 to 40%, left ventricular global hypokinesis and severely dilated cavity, grade 1 diastolic dysfunction.  Small pericardial effusion. Patient currently stable to go back to her facility, she needs another insurance authorization which was started today.  She will go back once insurance authorization got approval.  Patient is high risk for readmission and mortality based on underlying comorbidities and poor functional status.  Palliative care was also consulted.  3/5: Patient with new leukocytosis, abdominal pain, watery diarrhea and upper respiratory symptoms.  Respiratory panel was checked and came back positive for  RSV.  Patient was also recently started on pyridostigmine which can cause abdominal pain and diarrhea per ID, ordered CT abdomen to rule out any colitis.  Cardiology was also consulted and patient underwent cardiac MRI and right and left cardiac  catheterization with following results Cardiac MRI:  1.  Small circumferential pericardial effusion. 2.  Mildly dilated LV with diffuse hypokinesis, EF 30%. 3.  Normal RV size with RV EF 38%. 4. Difficult delayed enhancement images, looked hard to null LV myocardium. Possible small area of mid-wall LGE in the mid inferior wall, nonspecific. 5.  Extracellular volume percentage significantly elevated at 41%.   RHC/LHC: 1. Low filling pressures.  2. Normal cardiac output.  3. 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  4. 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 She was also started on amiodarone for concern of SVT and PVCs.  3/6: Febrile at 101.4 over the past 24-hour.  Patient with more upper respiratory symptoms.  Continue to have mild abdominal discomfort but mostly pointing towards epigastrium, loose stools in colostomy bag.  Improving leukocytosis, C. difficile came back positive for antigen and negative for toxin-pending PCR.  GI pathogen panel and UA was also ordered and pending.  CT abdomen was negative for any concern of colitis or diverticulitis.  Chest x-ray normal.  Likely due to RSV  3/7: C. difficile PCR came back positive so started on p.o. vancomycin.  GI pathogen panel is also positive for EPEC-no need to treat per ID. Hemoglobin decreased to 7.7 without any obvious bleeding, FOBT was negative.  Anemia panel with low iron, TIBC was not calculated. Likely anemia of chronic disease with mild iron deficiency. UA with large leukocytes-urine cultures ordered Also developed mild hyponatremia-giving some normal saline  3/8: Overall stable with stable labs, hyponatremia improving.  Urine cultures pending.  3/9: Slowly improving respiratory symptoms.  Abdominal pain has been resolved with improving diarrhea.  Persistently low albumin so started on albumin infusion for 3  days after discussing with cardiology.  3/10: Remained hemodynamically stable, albumin at 1.9 and potassium of 3.3 today.  Pending insurance authorization for rehab.  Urine cultures with multiple species.  3/11: Patient remained stable and continued to improve.  Pending insurance authorization for rehab.  3/12: Still pending insurance authorization for rehab. Mild hypokalemia which is being repleted.  3/13: Remained hemodynamically stable, day 7 of p.o. vancomycin.  Insurance declined rehab even with peer to peer stating that she has very poor underlying functional status and she need either long-term care or assisted living.  TOC is looking for other options, not sure whether she has insurance for that or not.  3/14: Appeal for insurance authorization for SNF has been denied. The patient will be discharged to home with family in am by 10:00 am. 03/27/2023 The patient has been discharged to home in fair condition. Assessment and Plan: * Hypotension Improved, but remains low. Patient is asymptomatic. Prior to admission the patient was on metoprolol and midodrine. Metoprolol has been discontinued, but midodrine has been up-titrated to 15 mg tid. Diuresis by way of torsemide has been pursued as inpatient. She is currently on Torsemide 20 mg daily.  Secondary adrenal insufficiency (HCC) Blood pressure currently borderline soft Patient received stress doses of Solu-Cortef, and has been continued on home doses of hydrocortisone.  Abdominal pain RSV infection - Noted. C. difficile antigen and PCR positive, GI pathogen panel for E. Coli. UA with large leukocytes. Urine cultures  are multimicrobial. -CT abdomen negative for diverticulitis or colitis. -Chest x-ray normal -ID was also consulted-started her on p.o. vancomycin -Supportive care  Myocardial injury Troponin peaked at 251 and then started trending down.  No chest pain.  Likely demand ischemia with concern of sepsis. -Echocardiogram EF  of 35 to 40%, severe LV dilation,mild LVH, normal RV, mild MR, mild aortic stenosis - new finding.  Cardiac catheterization with concern of nonischemic cardiomyopathy -Continue aspirin 81 mg daily -Completed 48 hours of heparin infusion.  Chronic obstructive pulmonary disease (COPD) (HCC) No concern of exacerbation. -Continue home bronchodilators  Chronic pain syndrome -As needed Tylenol and oxycodone  HFrEF (heart failure with reduced ejection fraction) (HCC)  2D echo on 10/07/2022 showed EF of 40-45%.  Patient has 1+ leg edema, but no worsening shortness breath, does not seem to have CHF exacerbation.  BNP of 193 Repeat echo with EF of 35 to 40%, severe LV dilation,mild LVH, normal RV, mild MR, mild aortic stenosis.  Patient also underwent right and left cardiac catheterization which shows nonischemic cardiomyopathy. 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 -Cardiology is on board and recommending continue low-dose Toprol and spironolactone -Continue amiodarone  History of pulmonary embolus 2020(PE) Continue home Eliquis.  Depression with anxiety -Continue home meds  Obesity (BMI 30-39.9) Estimated body mass index is 14.55 kg/m as calculated from the following:   Height as of this encounter: 5\' 8"  (1.727 m).   Weight as of this encounter: 43.4 kg.   -This will complicate overall prognosis -Encouraged sensible weight loss through activity and dietary modification.  Malnutrition of moderate degree Nutrition consulted.   C. difficile colitis Stools have slowed down considerably. The patient is continued on oral vancomycin as advised by ID.  RSV/bronchiolitis Treated with nebulizer treatments, hydrocortisone.  SVT (supraventricular tachycardia) (HCC) Resolved.  Nonischemic cardiomyopathy (HCC) Left and right heart cath demonstrated low filling pressures, normal cardiac output, minimal coronary disease, nonischemic  cardiomyopathy (concern for cardiac amyloidosis), mild aortic stenosis.  Sepsis (HCC) Sepsis with RR 24, HR 120, BP 91/52, and lactic acidosis with perforated diverticulitis upon admission. Treated and resolved.   Pressure injury of skin Pressure Injury 03/08/23 Ischial tuberosity Left Stage 2 -  Partial thickness loss of dermis presenting as a shallow open injury with a red, pink wound bed without slough. (Active)  03/08/23 1100  Location: Ischial tuberosity  Location Orientation: Left  Staging: Stage 2 -  Partial thickness loss of dermis presenting as a shallow open injury with a red, pink wound bed without slough.  Wound Description (Comments):   Present on Admission: Yes     Pressure Injury 03/08/23 Ischial tuberosity Right Stage 2 -  Partial thickness loss of dermis presenting as a shallow open injury with a red, pink wound bed without slough. (Active)  03/08/23 1100  Location: Ischial tuberosity  Location Orientation: Right  Staging: Stage 2 -  Partial thickness loss of dermis presenting as a shallow open injury with a red, pink wound bed without slough.  Wound Description (Comments):   Present on Admission: Yes     Pressure Injury 03/23/23 Sacrum Medial Stage 3 -  Full thickness tissue loss. Subcutaneous fat may be visible but bone, tendon or muscle are NOT exposed. slough present, red, pink (Active)  03/23/23 2130  Location: Sacrum  Location Orientation: Medial  Staging: Stage 3 -  Full thickness tissue loss. Subcutaneous fat may be visible but bone, tendon or muscle are NOT exposed.  Wound Description (  Comments): slough present, red, pink  Present on Admission:    Present on admission         Consultants: Infectious disease, cardiology, heart failure team, palliative care Procedures performed: None  Disposition: Home Diet recommendation:  Discharge Diet Orders (From admission, onward)     Start     Ordered   03/27/23 0000  Diet - low sodium heart healthy         03/27/23 0852           Cardiac diet DISCHARGE MEDICATION: Allergies as of 03/27/2023       Reactions   Hydrocodone-acetaminophen Swelling   hands   Iodine Anaphylaxis, Swelling   Iv dye 02/03/21-Ispoke to patient and she had IV contrast  X4 last year and has had no problem. ( Last CT abdomen with contrast was on 01/02/21.-    Other Other (See Comments)   ALLERGY TO METAL - BLACKENS SKIN AND CAUSES A RASH    Tape Swelling, Other (See Comments)   Skin comes off.  Paper tape is ok tegaderm OK   Peanut Oil Other (See Comments)   ** Nuts cause runny nose   Shellfish Allergy Nausea And Vomiting, Swelling   Episode of GI infection after eating clam chowder. Still eats shrimp and other seafood        Medication List     STOP taking these medications    guaiFENesin 600 MG 12 hr tablet Commonly known as: MUCINEX   lidocaine 4 %   magic mouthwash w/lidocaine Soln   metoprolol tartrate 25 MG tablet Commonly known as: LOPRESSOR   mupirocin ointment 2 % Commonly known as: BACTROBAN   nystatin cream Commonly known as: MYCOSTATIN       TAKE these medications    acetaminophen 325 MG tablet Commonly known as: TYLENOL Take 2 tablets (650 mg total) by mouth every 6 (six) hours as needed for mild pain or fever.   albuterol 108 (90 Base) MCG/ACT inhaler Commonly known as: VENTOLIN HFA Inhale 2 puffs into the lungs every 6 (six) hours as needed for wheezing.   alendronate 35 MG tablet Commonly known as: FOSAMAX Take 1 tablet (35 mg total) by mouth every 7 (seven) days Take with a full glass of water. Do not lie down for the next 30 min.   amiodarone 200 MG tablet Commonly known as: PACERONE Take 1 tablet (200 mg total) by mouth daily.   apixaban 5 MG Tabs tablet Commonly known as: ELIQUIS Take 1 tablet (5 mg total) by mouth 2 (two) times daily.   ascorbic acid 500 MG tablet Commonly known as: VITAMIN C Take 500 mg by mouth daily.   calcium carbonate 1250 (500  Ca) MG tablet Commonly known as: OS-CAL - dosed in mg of elemental calcium Take 2 tablets by mouth in the morning.   Carboxymethylcellulose Sod PF 1 % Gel Administer 2 drops to both eyes Three (3) times a day.   copper tablet Take 1 tablet (2 mg total) by mouth daily.   cyanocobalamin 1000 MCG tablet Commonly known as: VITAMIN B12 Take 1 tablet by mouth daily.   diphenhydrAMINE 25 mg capsule Commonly known as: BENADRYL Take 25 mg by mouth every 6 (six) hours as needed for itching or allergies.   docusate sodium 100 MG capsule Commonly known as: COLACE Take 100 mg by mouth 2 (two) times daily as needed for mild constipation.   estradiol 0.1 MG/GM vaginal cream Commonly known as: ESTRACE Place 2 g vaginally 3 (  three) times a week. Monday, Wednesday and Friday for recurrent UTI   famotidine 20 MG tablet Commonly known as: PEPCID Take 20 mg by mouth 2 (two) times daily.   ferrous sulfate 325 (65 FE) MG tablet Take 325 mg by mouth 2 (two) times daily with a meal.   fluticasone 50 MCG/ACT nasal spray Commonly known as: FLONASE Place 1 spray into both nostrils daily.   folic acid 1 MG tablet Commonly known as: FOLVITE Take 1 mg by mouth daily.   gabapentin 300 MG capsule Commonly known as: Neurontin Take 1 capsule (300 mg total) by mouth 3 (three) times daily.   hydrocortisone 5 MG tablet Commonly known as: CORTEF Take 3 tablets (15 mg total) by mouth every morning AND 1 tablet (5 mg total) every afternoon after lunch and before dinner   ipratropium-albuterol 0.5-2.5 (3) MG/3ML Soln Commonly known as: DUONEB Take 3 mLs by nebulization every 6 (six) hours as needed (shortness of breath).   lactose free nutrition Liqd Take 237 mLs by mouth 3 (three) times daily between meals.   levocetirizine 5 MG tablet Commonly known as: XYZAL Take 10 mg by mouth every evening.   melatonin 3 MG Tabs tablet Take 3 mg by mouth at bedtime as needed.   metoprolol succinate 25 MG 24  hr tablet Commonly known as: TOPROL-XL Take 0.5 tablets (12.5 mg total) by mouth daily.   midodrine 10 MG tablet Commonly known as: PROAMATINE Take 5 mg by mouth 3 (three) times daily.   montelukast 10 MG tablet Commonly known as: SINGULAIR Take 10 mg by mouth daily.   multivitamin with minerals Tabs tablet Take 1 tablet by mouth daily.   nortriptyline 50 MG capsule Commonly known as: PAMELOR Take 50 mg by mouth at bedtime.   omeprazole 20 MG tablet Commonly known as: PRILOSEC OTC Take 20 mg by mouth daily.   ondansetron 4 MG tablet Commonly known as: ZOFRAN Take 1 tablet (4 mg total) by mouth every 6 (six) hours as needed for nausea.   oxyCODONE-acetaminophen 10-325 MG tablet Commonly known as: Percocet Take 1 tablet by mouth every 6 (six) hours as needed for pain. What changed: Another medication with the same name was removed. Continue taking this medication, and follow the directions you see here.   polyethylene glycol 17 g packet Commonly known as: MIRALAX / GLYCOLAX Take 17 g by mouth daily as needed for moderate constipation.   potassium chloride SA 20 MEQ tablet Commonly known as: KLOR-CON M Take 2 tablets (40 mEq total) by mouth once for 1 dose. What changed:  how much to take when to take this   pyridostigmine 60 MG tablet Commonly known as: MESTINON Take 0.5 tablets (30 mg total) by mouth 2 (two) times daily.   spironolactone 25 MG tablet Commonly known as: ALDACTONE Take 0.5 tablets (12.5 mg total) by mouth daily.   thiamine 100 MG tablet Commonly known as: VITAMIN B1 Take 1 tablet by mouth daily.   torsemide 20 MG tablet Commonly known as: DEMADEX Take 1 tablet (20 mg total) by mouth daily. What changed: Another medication with the same name was removed. Continue taking this medication, and follow the directions you see here.   triamcinolone cream 0.1 % Commonly known as: KENALOG Apply topically daily as needed.   vancomycin 125 MG  capsule Commonly known as: VANCOCIN Take 1 capsule (125 mg total) by mouth 4 (four) times daily for 2 days.   vitamin A 3 MG (10000 UNITS) capsule Take  1 capsule (10,000 Units total) by mouth daily. For 60 days   Vitamin D-1000 Max St 25 MCG (1000 UT) tablet Generic drug: Cholecalciferol Take 1,000 Units by mouth daily.   zinc sulfate (50mg  elemental zinc) 220 (50 Zn) MG capsule Take 1 capsule (220 mg total) by mouth daily. For 14 days               Discharge Care Instructions  (From admission, onward)           Start     Ordered   03/27/23 0000  Discharge wound care:       Comments: Active Pressure Injury/Wound(s)     Pressure Ulcer  Duration         Pressure Injury 03/08/23 Ischial tuberosity Left Stage 2 -  Partial thickness loss of dermis presenting as a shallow open injury with a red, pink wound bed without slough. 18 days   Pressure Injury 03/08/23 Ischial tuberosity Right Stage 2 -  Partial thickness loss of dermis presenting as a shallow open injury with a red, pink wound bed without slough. 18 days   Pressure Injury 03/23/23 Sacrum Medial Stage 3 -  Full thickness tissue loss. Subcutaneous fat may be visible but bone, tendon or muscle are NOT exposed. slough present, red, pink 3 days      Wound Care Orders (From admission, onward)      Start     Ordered   03/25/23 1421    Wound care  Every shift      Comments: Bilateral legs (alternative to [employer] therapy) - Apply Kerlix and abd wrap since the base of the toes, until the knees.  03/25/23 1421   03/25/23 1416    Wound care  Until discontinued      Comments: Leave bilat Una boots on place, WOC team will change Q THURS. Pt refuses to reapply. Sacral wound: Apply Aquacel (#[id number]) on the wound bed, filling the dept. Cover with Foam dressing, change daily or PRN.  03/25/23 1416   03/27/23 0852            Contact information for follow-up providers     [facility]. Go on 04/02/2023.   Specialty: Cardiology Why: Hospital Follow-Up 04/02/2023 @ 11:30 Please bring a list of all medications to follow-up appointment [facility], [address] Free Valet Parking at the [employer] account planner information: [address] [place] [address] [phone]        [facility] Follow up.   Why: Home Health PT/OT/Aide arranged with [facility]. They will call to schedule first home visit. Contact information: [address] [place] [address] [phone]              Contact information for after-discharge care     Destination     [facility] Preferred SNF .   Service: Skilled Nursing Contact information: [address] [place] [address] [phone]                    Discharge Exam: Ceasar Mons Weights   03/22/23 0600 03/23/23 0500 03/26/23 0720  Weight: 119.2 kg 119.1 kg 43.4 kg   Exam:  Constitutional:  The patient is awake, alert, and oriented x 3. No acute distress. Respiratory:  No increased work of breathing. No wheezes, rales, or rhonchi No tactile fremitus Cardiovascular:  Regular rate and rhythm No murmurs, ectopy, or gallups. No lateral PMI. No thrills. Abdomen:  Abdomen is soft, non-tender, non-distended No hernias, masses, or organomegaly Normoactive bowel sounds.  Musculoskeletal:  No cyanosis, clubbing, or edema Skin:  No rashes, lesions, ulcers palpation of skin: no induration or nodules Neurologic:  CN 2-12 intact Sensation all 4 extremities intact Psychiatric:  Mental status Mood, affect appropriate Orientation to person, place, time  judgment and insight appear intact   Condition at discharge: fair  The results of significant diagnostics from this hospitalization (including imaging, microbiology, ancillary and  laboratory) are listed below for reference.   Imaging Studies: CT ABDOMEN PELVIS WO CONTRAST Result Date: 03/17/2023 CLINICAL DATA:  Acute abdominal pain EXAM: CT ABDOMEN AND PELVIS WITHOUT CONTRAST TECHNIQUE: Multidetector CT imaging of the abdomen and pelvis was performed following the standard protocol without IV contrast. RADIATION DOSE REDUCTION: This exam was performed according to the departmental dose-optimization program which includes automated exposure control, adjustment of the mA and/or kV according to patient size and/or use of iterative reconstruction technique. COMPARISON:  02/04/2021 FINDINGS: Lower chest: Mild left basilar atelectasis is noted. Small pericardial effusion is seen. Hepatobiliary: Fatty infiltration of the liver is noted. The gallbladder has been surgically removed. Pancreas: Unremarkable. No pancreatic ductal dilatation or surrounding inflammatory changes. Spleen: Normal in size without focal abnormality. Adrenals/Urinary Tract: Adrenal glands are within normal limits. Kidneys show normal appearance. A few scattered hyperdense cysts are noted within the right kidney stable from the prior exam. No renal calculi or obstructive changes are seen. The bladder is decompressed. Stomach/Bowel: Hartmann's pouch is noted. Left-sided colostomy is identified. Herniated small bowel loops are noted adjacent to the colonic ostomy loop. No obstructive changes are seen. Scattered diverticular change of the colon is noted. Colon is predominately decompressed. No obstructive or inflammatory changes are seen. The appendix is not well visualized. No inflammatory changes to suggest appendicitis are noted. Small bowel and stomach appear within normal limits. Vascular/Lymphatic: Aortic atherosclerosis. No enlarged abdominal or pelvic lymph nodes. Reproductive: Status post hysterectomy. No adnexal masses. Other: No abdominal wall hernia or abnormality. No abdominopelvic ascites. Musculoskeletal:  Degenerative changes of lumbar spine are noted. IMPRESSION: Postoperative changes consistent with the clinical history and left-sided ostomy. Diverticulosis without diverticulitis. Chronic changes as described above. Electronically Signed   By: Alcide Clever M.D.   On: 03/17/2023 22:41   DG Chest 2 View Result Date: 03/17/2023 CLINICAL DATA:  Pneumonia. EXAM: CHEST - 2 VIEW COMPARISON:  March 07, 2023. FINDINGS: Stable cardiomegaly. Both lungs are clear. The visualized skeletal structures are unremarkable. IMPRESSION: No active cardiopulmonary disease. Electronically Signed   By: Lupita Raider M.D.   On: 03/17/2023 12:49   CARDIAC CATHETERIZATION Result Date: 03/16/2023 1. Low filling pressures. 2. Normal cardiac output. 3. 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 4. Mild aortic stenosis, peak-to-peak gradient 18 mmHg   MR CARDIAC VELOCITY FLOW MAP Result Date: 03/16/2023 CLINICAL DATA:  Assess for cardiac amyloidosis EXAM: CARDIAC MRI TECHNIQUE: The patient was scanned on a 1.5 Tesla GE magnet. A dedicated cardiac coil was used. Functional imaging was done using Fiesta sequences. 2,3, and 4 chamber views were done to assess for RWMA's. Modified Simpson's rule using a short axis stack was used to calculate an ejection fraction on a dedicated work [employer]. The patient received 8 cc of Gadavist. After 10 minutes inversion recovery sequences were used to assess for infiltration and scar tissue. FINDINGS: Limited images of the lung fields  showed no gross abnormalities. There is a small circumferential pericardial effusion. Mildly dilated left ventricle with normal wall thickness. Diffuse hypokinesis, LV EF 30%. Normal RV size with RV EF 38%. Mild right atrial enlargement, normal left atrium. Trileaflet aortic valve, thickened and mildly restricted. No significant mitral regurgitation noted visually, unable to calculate regurgitant fraction. Delayed  enhancement images appeared somewhat difficult to null. There was a possible small area of mid-wall late gadolinium enhancement (LGE) in the mid inferior wall. MEASUREMENTS: MEASUREMENTS LVEDV 291 mL LVEDVi 123 mL/m2 LVSV 87 mL LVEF 30% RVEDV 188 mL RVEDVi 80 mL/m2 RVSV 71 mL RVEF 38% Unable to calculated aortic forward volume via flow sequences, flow sequences limited by artifact. T1 1226, ECV 41% IMPRESSION: 1.  Small circumferential pericardial effusion. 2.  Mildly dilated LV with diffuse hypokinesis, EF 30%. 3.  Normal RV size with RV EF 38%. 4. Difficult delayed enhancement images, looked hard to null LV myocardium. Possible small area of mid-wall LGE in the mid inferior wall, nonspecific. 5.  Extracellular volume percentage significantly elevated at 41%. 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 However, the LV wall was not significantly thickened. Dalton Mclean Electronically Signed   By: Marca Ancona M.D.   On: 03/16/2023 13:08   MR CARDIAC VELOCITY FLOW MAP Result Date: 03/16/2023 CLINICAL DATA:  Assess for cardiac amyloidosis EXAM: CARDIAC MRI TECHNIQUE: The patient was scanned on a 1.5 Tesla GE magnet. A dedicated cardiac coil was used. Functional imaging was done using Fiesta sequences. 2,3, and 4 chamber views were done to assess for RWMA's. Modified Simpson's rule using a short axis stack was used to calculate an ejection fraction on a dedicated work [employer], [employer]. The patient received 8 cc of Gadavist. After 10 minutes inversion recovery sequences were used to assess for infiltration and scar tissue. FINDINGS: Limited images of the lung fields showed no gross abnormalities. There is a small circumferential pericardial effusion. Mildly dilated left ventricle with normal wall thickness. Diffuse hypokinesis, LV EF 30%. Normal RV size with RV EF 38%. Mild right atrial enlargement, normal  left atrium. Trileaflet aortic valve, thickened and mildly restricted. No significant mitral regurgitation noted visually, unable to calculate regurgitant fraction. Delayed enhancement images appeared somewhat difficult to null. There was a possible small area of mid-wall late gadolinium enhancement (LGE) in the mid inferior wall. MEASUREMENTS: MEASUREMENTS LVEDV 291 mL LVEDVi 123 mL/m2 LVSV 87 mL LVEF 30% RVEDV 188 mL RVEDVi 80 mL/m2 RVSV 71 mL RVEF 38% Unable to calculated aortic forward volume via flow sequences, flow sequences limited by artifact. T1 1226, ECV 41% IMPRESSION: 1.  Small circumferential pericardial effusion. 2.  Mildly dilated LV with diffuse hypokinesis, EF 30%. 3.  Normal RV size with RV EF 38%. 4. Difficult delayed enhancement images, looked hard to null LV myocardium. Possible small area of mid-wall LGE in the mid inferior wall, nonspecific. 5.  Extracellular volume percentage significantly elevated at 41%. 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 However, the LV wall was not significantly thickened. Dalton Mclean Electronically Signed   By: Marca Ancona M.D.   On: 03/16/2023 13:08   MR CARDIAC MORPHOLOGY W WO CONTRAST Result Date: 03/16/2023 CLINICAL DATA:  Assess for cardiac amyloidosis EXAM: CARDIAC MRI TECHNIQUE: The patient was scanned on a 1.5 Tesla GE magnet. A dedicated cardiac coil was used. Functional  imaging was done using Fiesta sequences. 2,3, and 4 chamber views were done to assess for RWMA's. Modified Simpson's rule using a short axis stack was used to calculate an ejection fraction on a dedicated work [employer] officer, [employer]. The patient received 8 cc of Gadavist. After 10 minutes inversion recovery sequences were used to assess for infiltration and scar tissue. FINDINGS: Limited images of the lung fields showed no gross abnormalities. There is a small circumferential  pericardial effusion. Mildly dilated left ventricle with normal wall thickness. Diffuse hypokinesis, LV EF 30%. Normal RV size with RV EF 38%. Mild right atrial enlargement, normal left atrium. Trileaflet aortic valve, thickened and mildly restricted. No significant mitral regurgitation noted visually, unable to calculate regurgitant fraction. Delayed enhancement images appeared somewhat difficult to null. There was a possible small area of mid-wall late gadolinium enhancement (LGE) in the mid inferior wall. MEASUREMENTS: MEASUREMENTS LVEDV 291 mL LVEDVi 123 mL/m2 LVSV 87 mL LVEF 30% RVEDV 188 mL RVEDVi 80 mL/m2 RVSV 71 mL RVEF 38% Unable to calculated aortic forward volume via flow sequences, flow sequences limited by artifact. T1 1226, ECV 41% IMPRESSION: 1.  Small circumferential pericardial effusion. 2.  Mildly dilated LV with diffuse hypokinesis, EF 30%. 3.  Normal RV size with RV EF 38%. 4. Difficult delayed enhancement images, looked hard to null LV myocardium. Possible small area of mid-wall LGE in the mid inferior wall, nonspecific. 5.  Extracellular volume percentage significantly elevated at 41%. 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 However, the LV wall was not significantly thickened. Dalton Mclean Electronically Signed   By: Marca Ancona M.D.   On: 03/16/2023 13:08   ECHOCARDIOGRAM COMPLETE Result Date: 03/09/2023    ECHOCARDIOGRAM REPORT   Patient Name:   JERAE IZARD Borthwick Date of Exam: 03/09/2023 Medical Rec #:  [id number]       Height:       68.0 in Accession #:    [id number]      Weight:       256.0 lb Date of Birth:  Jul 17, 1950       BSA:          2.270 m Patient Age:    72 years        BP:           97/55 mmHg Patient Gender: F               HR:           75 bpm. Exam Location:  [facility] Procedure: 2D Echo, 3D Echo, Cardiac Doppler, Color Doppler and Strain Analysis            (Both Spectral and  Color Flow Doppler were utilized during            procedure). Indications:     Abnormal ECG  History:         Patient has prior history of Echocardiogram examinations, most                  recent 09/08/2017. CHF and Cardiomyopathy, Acute MI, Abnormal                  ECG, COPD, Arrythmias:PVC and Tachycardia, Signs/Symptoms:Chest                  Pain; Risk Factors:Sleep Apnea and Former Smoker. Pulmonary  embolus, Ablation.  Sonographer:     Mikki Harbor Referring Phys:  [id number] AMIN Diagnosing Phys: Yvonne Kendall MD  Sonographer Comments: Technically difficult study due to poor echo windows and patient is obese. Global longitudinal strain was attempted. IMPRESSIONS  1. Left ventricular ejection fraction, by estimation, is 35 to 40%. The left ventricle has moderately decreased function. The left ventricle demonstrates global hypokinesis. The left ventricular internal cavity size was severely dilated. There is mild left ventricular hypertrophy. Left ventricular diastolic parameters are consistent with Grade I diastolic dysfunction (impaired relaxation). Elevated left atrial pressure. The average left ventricular global longitudinal strain is -8.6 %. The global longitudinal strain is abnormal.  2. Right ventricular systolic function is normal. The right ventricular size is mildly enlarged. There is normal pulmonary artery systolic pressure.  3. Left atrial size was mildly dilated.  4. A small pericardial effusion is present. The pericardial effusion is posterior to the left ventricle.  5. The mitral valve is degenerative. Mild mitral valve regurgitation. No evidence of mitral stenosis.  6. Tricuspid valve regurgitation is mild to moderate.  7. The aortic valve is tricuspid. There is moderate calcification of the aortic valve. There is moderate thickening of the aortic valve. Aortic valve regurgitation is trivial. Mild aortic valve stenosis. Aortic valve mean gradient measures 10.0 mmHg.   8. There is mild dilatation of the ascending aorta, measuring 38 mm.  9. The inferior vena cava is dilated in size with >50% respiratory variability, suggesting right atrial pressure of 8 mmHg. FINDINGS  Left Ventricle: Left ventricular ejection fraction, by estimation, is 35 to 40%. The left ventricle has moderately decreased function. The left ventricle demonstrates global hypokinesis. The average left ventricular global longitudinal strain is -8.6 %.  Strain was performed and the global longitudinal strain is abnormal. The left ventricular internal cavity size was severely dilated. There is mild left ventricular hypertrophy. Left ventricular diastolic parameters are consistent with Grade I diastolic dysfunction (impaired relaxation). Elevated left atrial pressure. Right Ventricle: The right ventricular size is mildly enlarged. No increase in right ventricular wall thickness. Right ventricular systolic function is normal. There is normal pulmonary artery systolic pressure. The tricuspid regurgitant velocity is 2.64  m/s, and with an assumed right atrial pressure of 8 mmHg, the estimated right ventricular systolic pressure is 35.9 mmHg. Left Atrium: Left atrial size was mildly dilated. Right Atrium: Right atrial size was normal in size. Pericardium: A small pericardial effusion is present. The pericardial effusion is posterior to the left ventricle. Mitral Valve: The mitral valve is degenerative in appearance. There is moderate thickening of the mitral valve leaflet(s). There is mild calcification of the mitral valve leaflet(s). Mild mitral valve regurgitation. No evidence of mitral valve stenosis. MV peak gradient, 7.0 mmHg. The mean mitral valve gradient is 3.0 mmHg. Tricuspid Valve: The tricuspid valve is grossly normal. Tricuspid valve regurgitation is mild to moderate. Aortic Valve: The aortic valve is tricuspid. There is moderate calcification of the aortic valve. There is moderate thickening of the aortic  valve. Aortic valve regurgitation is trivial. Mild aortic stenosis is present. Aortic valve mean gradient measures 10.0 mmHg. Aortic valve peak gradient measures 16.8 mmHg. Aortic valve area, by VTI measures 2.45 cm. Pulmonic Valve: The pulmonic valve was normal in structure. Pulmonic valve regurgitation is mild to moderate. No evidence of pulmonic stenosis. Aorta: The aortic root is normal in size and structure. There is mild dilatation of the ascending aorta, measuring 38 mm. Venous: The inferior vena cava is dilated  in size with greater than 50% respiratory variability, suggesting right atrial pressure of 8 mmHg. IAS/Shunts: No atrial level shunt detected by color flow Doppler. Additional Comments: 3D was performed not requiring image post processing on an independent workstation and was abnormal.  LEFT VENTRICLE PLAX 2D LVIDd:         6.30 cm      Diastology LVIDs:         5.00 cm      LV e' medial:    4.24 cm/s LV PW:         1.15 cm      LV E/e' medial:  17.3 LV IVS:        1.07 cm      LV e' lateral:   6.85 cm/s LVOT diam:     2.20 cm      LV E/e' lateral: 10.7 LV SV:         112 LV SV Index:   49           2D Longitudinal Strain LVOT Area:     3.80 cm     2D Strain GLS Avg:     -8.6 %  LV Volumes (MOD) LV vol d, MOD A2C: 180.0 ml 3D Volume EF: LV vol d, MOD A4C: 189.0 ml 3D EF:        37 % LV vol s, MOD A2C: 116.0 ml LV vol s, MOD A4C: 126.0 ml LV SV MOD A2C:     64.0 ml LV SV MOD A4C:     189.0 ml LV SV MOD BP:      67.1 ml RIGHT VENTRICLE RV Basal diam:  3.85 cm RV Mid diam:    3.70 cm RV S prime:     16.40 cm/s TAPSE (M-mode): 2.7 cm LEFT ATRIUM             Index        RIGHT ATRIUM           Index LA diam:        3.30 cm 1.45 cm/m   RA Area:     17.70 cm LA Vol (A2C):   51.5 ml 22.69 ml/m  RA Volume:   42.10 ml  18.55 ml/m LA Vol (A4C):   86.5 ml 38.11 ml/m LA Biplane Vol: 71.4 ml 31.46 ml/m  AORTIC VALVE                     PULMONIC VALVE AV Area (Vmax):    2.19 cm      PV Vmax:       1.24 m/s  AV Area (Vmean):   2.05 cm      PV Peak grad:  6.1 mmHg AV Area (VTI):     2.45 cm AV Vmax:           205.00 cm/s AV Vmean:          144.000 cm/s AV VTI:            0.457 m AV Peak Grad:      16.8 mmHg AV Mean Grad:      10.0 mmHg LVOT Vmax:         118.00 cm/s LVOT Vmean:        77.600 cm/s LVOT VTI:          0.295 m LVOT/AV VTI ratio: 0.65  AORTA Ao Root diam: 3.70 cm Ao Asc diam:  3.80 cm MITRAL VALVE  TRICUSPID VALVE MV Area (PHT): 5.20 cm     TR Peak grad:   27.9 mmHg MV Area VTI:   2.45 cm     TR Vmax:        264.00 cm/s MV Peak grad:  7.0 mmHg MV Mean grad:  3.0 mmHg     SHUNTS MV Vmax:       1.32 m/s     Systemic VTI:  0.30 m MV Vmean:      79.5 cm/s    Systemic Diam: 2.20 cm MV Decel Time: 146 msec MV E velocity: 73.50 cm/s MV A velocity: 104.00 cm/s MV E/A ratio:  0.71 Cristal Deer End MD Electronically signed by Yvonne Kendall MD Signature Date/Time: 03/09/2023/3:42:35 PM    Final    DG Chest Port 1 View Result Date: 03/07/2023 CLINICAL DATA:  Questionable sepsis.  Fever, hypotension EXAM: PORTABLE CHEST 1 VIEW COMPARISON:  01/29/2023 FINDINGS: Cardiomegaly. No confluent airspace opacities, effusions or edema. No acute bony abnormality. Aortic atherosclerosis. IMPRESSION: Cardiomegaly.  No active disease. Electronically Signed   By: Charlett Nose M.D.   On: 03/07/2023 21:15    Microbiology: Results for orders placed or performed during the hospital encounter of 03/07/23  Resp panel by RT-PCR (RSV, Flu A&B, Covid) Anterior Nasal Swab     Status: None   Collection Time: 03/07/23  8:35 PM   Specimen: Anterior Nasal Swab  Result Value Ref Range Status   SARS Coronavirus 2 by RT PCR NEGATIVE NEGATIVE Final    Comment: (NOTE) SARS-CoV-2 target nucleic acids are NOT DETECTED.  The SARS-CoV-2 RNA is generally detectable in upper respiratory specimens during the acute phase of infection. The lowest concentration of SARS-CoV-2 viral copies this assay can detect is 138 copies/mL. A  negative result does not preclude SARS-Cov-2 infection and should not be used as the sole basis for treatment or other patient management decisions. A negative result may occur with  improper specimen collection/handling, submission of specimen other than nasopharyngeal swab, presence of viral mutation(s) within the areas targeted by this assay, and inadequate number of viral copies(<138 copies/mL). A negative result must be combined with clinical observations, patient history, and epidemiological information. The expected result is Negative.  Fact Sheet for Patients:  BloggerCourse.com  Fact Sheet for Healthcare Providers:  SeriousBroker.it  This test is no t yet approved or cleared by the [place] FDA and  has been authorized for detection and/or diagnosis of SARS-CoV-2 by FDA under an Emergency Use Authorization (EUA). This EUA will remain  in effect (meaning this test can be used) for the duration of the COVID-19 declaration under Section 564(b)(1) of the Act, 21 U.S.C.section 360bbb-3(b)(1), unless the authorization is terminated  or revoked sooner.       Influenza A by PCR NEGATIVE NEGATIVE Final   Influenza B by PCR NEGATIVE NEGATIVE Final    Comment: (NOTE) The Xpert Xpress SARS-CoV-2/FLU/RSV plus assay is intended as an aid in the diagnosis of influenza from Nasopharyngeal swab specimens and should not be used as a sole basis for treatment. Nasal washings and aspirates are unacceptable for Xpert Xpress SARS-CoV-2/FLU/RSV testing.  Fact Sheet for Patients: BloggerCourse.com  Fact Sheet for Healthcare Providers: SeriousBroker.it  This test is not yet approved or cleared by the [place] FDA and has been authorized for detection and/or diagnosis of SARS-CoV-2 by FDA under an Emergency Use Authorization (EUA). This EUA will remain in effect (meaning this test can  be used) for the duration of the COVID-19 declaration under  Section 564(b)(1) of the Act, 21 U.S.C. section 360bbb-3(b)(1), unless the authorization is terminated or revoked.     Resp Syncytial Virus by PCR NEGATIVE NEGATIVE Final    Comment: (NOTE) Fact Sheet for Patients: BloggerCourse.com  Fact Sheet for Healthcare Providers: SeriousBroker.it  This test is not yet approved or cleared by the [place] FDA and has been authorized for detection and/or diagnosis of SARS-CoV-2 by FDA under an Emergency Use Authorization (EUA). This EUA will remain in effect (meaning this test can be used) for the duration of the COVID-19 declaration under Section 564(b)(1) of the Act, 21 U.S.C. section 360bbb-3(b)(1), unless the authorization is terminated or revoked.  Performed at [facility], [address] [place], [place] [address]   Blood Culture (routine x 2)     Status: None   Collection Time: 03/07/23  8:35 PM   Specimen: BLOOD  Result Value Ref Range Status   Specimen Description BLOOD BLOOD LEFT HAND  Final   Special Requests   Final    BOTTLES DRAWN AEROBIC AND ANAEROBIC Blood Culture results may not be optimal due to an excessive volume of blood received in culture bottles   Culture   Final    NO GROWTH 5 DAYS Performed at [facility], [address] [place], [place] [address]    Report Status 03/12/2023 FINAL  Final  Blood Culture (routine x 2)     Status: None   Collection Time: 03/07/23  8:36 PM   Specimen: BLOOD  Result Value Ref Range Status   Specimen Description BLOOD BLOOD LEFT ARM  Final   Special Requests   Final    BOTTLES DRAWN AEROBIC AND ANAEROBIC Blood Culture results may not be optimal due to an excessive volume of blood received in culture bottles   Culture   Final    NO GROWTH 5 DAYS Performed at [facility], [address], [place], [place] [address]    Report Status  03/12/2023 FINAL  Final  Urine Culture     Status: None   Collection Time: 03/07/23  9:26 PM   Specimen: Urine, Random  Result Value Ref Range Status   Specimen Description   Final    URINE, RANDOM Performed at [facility], [address] [place], [place] [address]    Special Requests   Final    NONE Reflexed from [id number] Performed at [facility], [address], [place], [place] [address]    Culture   Final    NO GROWTH Performed at [facility], [address], [place], [place] [address]    Report Status 03/09/2023 FINAL  Final  Culture, blood (Routine X 2) w Reflex to ID Panel     Status: None   Collection Time: 03/17/23  8:19 AM   Specimen: BLOOD  Result Value Ref Range Status   Specimen Description BLOOD BLOOD RIGHT HAND  Final   Special Requests   Final    BOTTLES DRAWN AEROBIC AND ANAEROBIC Blood Culture adequate volume   Culture   Final    NO GROWTH 5 DAYS Performed at [facility], [address], [place], [place] [address]    Report Status 03/22/2023 FINAL  Final  Culture, blood (Routine X 2) w Reflex to ID Panel     Status: None   Collection Time: 03/17/23  8:36 AM   Specimen: BLOOD  Result Value Ref Range Status   Specimen Description BLOOD BLOOD RIGHT HAND  Final  Special Requests   Final    BOTTLES DRAWN AEROBIC AND ANAEROBIC Blood Culture adequate volume   Culture   Final    NO GROWTH 5 DAYS Performed at [facility], [address] [place], [place] [address]    Report Status 03/22/2023 FINAL  Final  Resp panel by RT-PCR (RSV, Flu A&B, Covid) Anterior Nasal Swab     Status: Abnormal   Collection Time: 03/17/23 12:08 PM   Specimen: Anterior Nasal Swab  Result Value Ref Range Status   SARS Coronavirus 2 by RT PCR NEGATIVE NEGATIVE Final    Comment: (NOTE) SARS-CoV-2 target nucleic acids are NOT DETECTED.  The SARS-CoV-2 RNA is generally detectable in upper respiratory specimens during the acute phase of  infection. The lowest concentration of SARS-CoV-2 viral copies this assay can detect is 138 copies/mL. A negative result does not preclude SARS-Cov-2 infection and should not be used as the sole basis for treatment or other patient management decisions. A negative result may occur with  improper specimen collection/handling, submission of specimen other than nasopharyngeal swab, presence of viral mutation(s) within the areas targeted by this assay, and inadequate number of viral copies(<138 copies/mL). A negative result must be combined with clinical observations, patient history, and epidemiological information. The expected result is Negative.  Fact Sheet for Patients:  BloggerCourse.com  Fact Sheet for Healthcare Providers:  SeriousBroker.it  This test is no t yet approved or cleared by the [place] FDA and  has been authorized for detection and/or diagnosis of SARS-CoV-2 by FDA under an Emergency Use Authorization (EUA). This EUA will remain  in effect (meaning this test can be used) for the duration of the COVID-19 declaration under Section 564(b)(1) of the Act, 21 U.S.C.section 360bbb-3(b)(1), unless the authorization is terminated  or revoked sooner.       Influenza A by PCR NEGATIVE NEGATIVE Final   Influenza B by PCR NEGATIVE NEGATIVE Final    Comment: (NOTE) The Xpert Xpress SARS-CoV-2/FLU/RSV plus assay is intended as an aid in the diagnosis of influenza from Nasopharyngeal swab specimens and should not be used as a sole basis for treatment. Nasal washings and aspirates are unacceptable for Xpert Xpress SARS-CoV-2/FLU/RSV testing.  Fact Sheet for Patients: BloggerCourse.com  Fact Sheet for Healthcare Providers: SeriousBroker.it  This test is not yet approved or cleared by the [place] FDA and has been authorized for detection and/or diagnosis of SARS-CoV-2  by FDA under an Emergency Use Authorization (EUA). This EUA will remain in effect (meaning this test can be used) for the duration of the COVID-19 declaration under Section 564(b)(1) of the Act, 21 U.S.C. section 360bbb-3(b)(1), unless the authorization is terminated or revoked.     Resp Syncytial Virus by PCR POSITIVE (A) NEGATIVE Final    Comment: (NOTE) Fact Sheet for Patients: BloggerCourse.com  Fact Sheet for Healthcare Providers: SeriousBroker.it  This test is not yet approved or cleared by the [place] FDA and has been authorized for detection and/or diagnosis of SARS-CoV-2 by FDA under an Emergency Use Authorization (EUA). This EUA will remain in effect (meaning this test can be used) for the duration of the COVID-19 declaration under Section 564(b)(1) of the Act, 21 U.S.C. section 360bbb-3(b)(1), unless the authorization is terminated or revoked.  Performed at [facility], [address] [place], [place] [address]   Respiratory (~20 pathogens) panel by PCR     Status: Abnormal   Collection Time: 03/17/23 12:08 PM   Specimen: Nasopharyngeal Swab; Respiratory  Result Value  Ref Range Status   Adenovirus NOT DETECTED NOT DETECTED Final   Coronavirus 229E NOT DETECTED NOT DETECTED Final    Comment: (NOTE) The Coronavirus on the Respiratory Panel, DOES NOT test for the novel  Coronavirus (2019 nCoV)    Coronavirus HKU1 NOT DETECTED NOT DETECTED Final   Coronavirus NL63 NOT DETECTED NOT DETECTED Final   Coronavirus OC43 NOT DETECTED NOT DETECTED Final   Metapneumovirus NOT DETECTED NOT DETECTED Final   Rhinovirus / Enterovirus NOT DETECTED NOT DETECTED Final   Influenza A NOT DETECTED NOT DETECTED Final   Influenza B NOT DETECTED NOT DETECTED Final   Parainfluenza Virus 1 NOT DETECTED NOT DETECTED Final   Parainfluenza Virus 2 NOT DETECTED NOT DETECTED Final   Parainfluenza Virus 3 NOT DETECTED NOT  DETECTED Final   Parainfluenza Virus 4 NOT DETECTED NOT DETECTED Final   Respiratory Syncytial Virus DETECTED (A) NOT DETECTED Final   Bordetella pertussis NOT DETECTED NOT DETECTED Final   Bordetella Parapertussis NOT DETECTED NOT DETECTED Final   Chlamydophila pneumoniae NOT DETECTED NOT DETECTED Final   Mycoplasma pneumoniae NOT DETECTED NOT DETECTED Final    Comment: Performed at [facility], [address], [place], [place] [address]  C Difficile Quick Screen (NO PCR Reflex)     Status: Abnormal   Collection Time: 03/18/23  2:20 PM   Specimen: STOOL  Result Value Ref Range Status   C Diff antigen POSITIVE (A) NEGATIVE Final   C Diff toxin NEGATIVE NEGATIVE Final   C Diff interpretation   Final    Results are indeterminate. Please contact the provider listed for your campus for C diff questions in AMION.    Comment: Performed at [facility], [address] [place], [place] [address]  Gastrointestinal Panel by PCR , Stool     Status: Abnormal   Collection Time: 03/18/23  2:20 PM   Specimen: STOOL  Result Value Ref Range Status   Campylobacter species NOT DETECTED NOT DETECTED Final   Plesimonas shigelloides NOT DETECTED NOT DETECTED Final   Salmonella species NOT DETECTED NOT DETECTED Final   Yersinia enterocolitica NOT DETECTED NOT DETECTED Final   Vibrio species NOT DETECTED NOT DETECTED Final   Vibrio cholerae NOT DETECTED NOT DETECTED Final   Enteroaggregative E coli (EAEC) NOT DETECTED NOT DETECTED Final   Enteropathogenic E coli (EPEC) DETECTED (A) NOT DETECTED Final    Comment: RESULT CALLED TO, READ BACK BY AND VERIFIED WITH: JAMIE MORSHER @1829  ON 03/18/23 [facility]    Enterotoxigenic E coli (ETEC) NOT DETECTED NOT DETECTED Final   Shiga like toxin producing E coli (STEC) NOT DETECTED NOT DETECTED Final   Shigella/Enteroinvasive E coli (EIEC) NOT DETECTED NOT DETECTED Final   Cryptosporidium NOT DETECTED NOT DETECTED Final   Cyclospora cayetanensis NOT  DETECTED NOT DETECTED Final   Entamoeba histolytica NOT DETECTED NOT DETECTED Final   Giardia lamblia NOT DETECTED NOT DETECTED Final   Adenovirus F40/41 NOT DETECTED NOT DETECTED Final   Astrovirus NOT DETECTED NOT DETECTED Final   Norovirus GI/GII NOT DETECTED NOT DETECTED Final   Rotavirus A NOT DETECTED NOT DETECTED Final   Sapovirus (I, II, IV, and V) NOT DETECTED NOT DETECTED Final    Comment: Performed at [facility], [address] [place], [place] [address]  C. Diff by PCR     Status: Abnormal   Collection Time: 03/18/23  2:20 PM   Specimen: STOOL  Result Value Ref Range Status   Toxigenic C. Difficile by  PCR POSITIVE (A) NEGATIVE Final    Comment: Positive for toxigenic C. difficile with little to no toxin production. Only treat if clinical presentation suggests symptomatic illness. Performed at [facility], [address], [place], [place] [address]   Urine Culture (for pregnant, neutropenic or urologic patients or patients with an indwelling urinary catheter)     Status: Abnormal   Collection Time: 03/18/23 10:35 PM   Specimen: Urine, Clean Catch  Result Value Ref Range Status   Specimen Description   Final    URINE, CLEAN CATCH Performed at [facility], [address], [place], [place] [address]    Special Requests   Final    NONE Performed at [facility], [address] [place], [place] [address]    Culture MULTIPLE SPECIES PRESENT, SUGGEST RECOLLECTION (A)  Final   Report Status 03/22/2023 FINAL  Final  MRSA Next Gen by PCR, Nasal     Status: None   Collection Time: 03/22/23 11:30 AM   Specimen: Nasal Mucosa; Nasal Swab  Result Value Ref Range Status   MRSA by PCR Next Gen NOT DETECTED NOT DETECTED Final    Comment: (NOTE) The GeneXpert MRSA Assay (FDA approved for NASAL specimens only), is one component of a comprehensive MRSA colonization surveillance program. It is not intended to diagnose MRSA infection nor to  guide or monitor treatment for MRSA infections. Test performance is not FDA approved in patients less than 49 years old. Performed at [facility], [address] [place], [place] [address]     Labs: CBC: Recent Labs  Lab 03/21/23 0445 03/27/23 0551  WBC 10.1 11.2*  HGB 8.2* 9.0*  HCT 25.1* 28.0*  MCV 82.8 85.6  PLT 663* 595*   Basic Metabolic Panel: Recent Labs  Lab 03/21/23 0445 03/22/23 0511 03/22/23 0512 03/23/23 0457 03/23/23 0458 03/24/23 0624 03/25/23 0455 03/26/23 0308 03/26/23 1922 03/27/23 0551  NA 133*  --    < > 134*  --  136 136 138 132* 134*  K 3.4*  --    < > 3.4*  --  3.3* 3.6 3.9 3.7 3.4*  CL 99  --    < > 100  --  101 100 99 98 96*  CO2 25  --    < > 27  --  28 28 29 30 31   GLUCOSE 85  --    < > 81  --  72 85 96 117* 82  BUN 18  --    < > 12  --  12 13 17 17 18   CREATININE 0.73  --    < > 0.69   < > 0.64 0.65 0.75 0.83 0.76  CALCIUM 7.5*  --    < > 7.8*  --  8.0* 8.1* 8.4* 7.8* 7.9*  MG 1.9 1.9  --  1.9  --   --   --   --   --   --   PHOS 2.4* 2.6   < > 2.7  --  2.9 3.5 3.4  --  3.7   < > = values in this interval not displayed.   Liver Function Tests: Recent Labs  Lab 03/23/23 0457 03/24/23 0624 03/25/23 0455 03/26/23 0308 03/27/23 0551  ALBUMIN 2.2* 2.2* 2.1* 1.9* 2.0*   CBG: No results for input(s): "GLUCAP" in the last 168 hours.  Discharge time spent: greater than 30 minutes.  Signed: Carlton Buskey, DO [facility] 03/27/2023

## 2023-03-27 NOTE — Progress Notes (Signed)
 All wound dressings changed and Kerlix and abd wrap since the base of the toes, until the knees applied.

## 2023-03-27 NOTE — TOC Transition Note (Signed)
 Transition of Care [facility]) - Discharge Note   Patient Details  Name: Wendy Sanchez MRN: [id number] Date of Birth: [birth year]  Transition of Care [facility]) CM/SW Contact:  Bing Quarry, RN Phone Number: 03/27/2023, 9:50 AM   Clinical Narrative:  3/15: Admitted from [facility] on 03[facility]07[facility]23 via EMS to ED with complaints/reports of fever and low blood pressure. Appeal was denied for STR/SNF on this admission/discharge. Discharge orders in for patient to home with home health via [facility] for PT/OT/HH Event organiser. No DME orders seen on order review. Patient will need EMS transport which was arranged for 11 am today by prior CSW with from placed on hard chart on unit. Sister was notified by prior CSW as well on discharge plan.    Gabriel Cirri MSN RN CM  RN Case Manager [place]  Transitions of Care Direct Dial: [phone] (Weekends Only) [facility] Main Office Phone: [phone] [facility] Fax: [phone] [email or web address]     Final next level of care: Home w Home Health Services Barriers to Discharge: Barriers Resolved   Patient Goals and CMS Choice            Discharge Placement                  Name of family member notified: Sherlynn Carbon (Sister)  [phone] (Mobile) Patient and family notified of of transfer: 03/25/23  Discharge Plan and Services Additional resources added to the After Visit Summary for       Post Acute Care Choice: Skilled Nursing Facility          DME Arranged: N/A DME Agency: NA       HH Arranged: PT, OT, Nurse's Aide, Social Work [employer] Agency: [employer] ([facility])        Social Drivers of Health (SDOH) Interventions SDOH Screenings   Food Insecurity: No Food Insecurity (03/12/2023)  Housing: Low Risk  (03/12/2023)  Transportation Needs: Unmet Transportation Needs (03/12/2023)  Utilities: Not At Risk (03/08/2023)  Depression (PHQ2-9): Low Risk  (07/01/2022)  Financial Resource Strain: Low Risk  (12/26/2020)   Received  from [facility]  Social Connections: Unknown (03/08/2023)  Tobacco Use: High Risk (03/12/2023)     Readmission Risk Interventions    03/08/2023   11:09 AM 02/03/2021    9:12 AM  Readmission Risk Prevention Plan  Transportation Screening Complete Complete  Medication Review (RN Care Manager) Complete Complete  PCP or Specialist appointment within 3-5 days of discharge Complete Complete  HRI or Home Care Consult  Complete  SW Recovery Care/Counseling Consult Complete   Palliative Care Screening Not Applicable Not Applicable  Skilled Nursing Facility Complete Complete

## 2023-03-27 NOTE — Progress Notes (Signed)
 MD order received in [facility] to discharge pt home with home health today; TOC previously established nonemergency EMS transport for pt; home health previously established with [facility]; gave pt's sister, W. Janee Morn, the AVS and Rx for mutivitamin; no questions voiced at this time; pt discharged by EMS with foley catheter in tact to home

## 2023-03-27 NOTE — Plan of Care (Signed)
  Problem: Education: Goal: Knowledge of General Education information will improve Description: Including pain rating scale, medication(s)/side effects and non-pharmacologic comfort measures Outcome: Adequate for Discharge   Problem: Health Behavior/Discharge Planning: Goal: Ability to manage health-related needs will improve Outcome: Adequate for Discharge   Problem: Clinical Measurements: Goal: Ability to maintain clinical measurements within normal limits will improve Outcome: Adequate for Discharge Goal: Will remain free from infection Outcome: Adequate for Discharge Goal: Diagnostic test results will improve Outcome: Adequate for Discharge Goal: Respiratory complications will improve Outcome: Adequate for Discharge Goal: Cardiovascular complication will be avoided Outcome: Adequate for Discharge   Problem: Activity: Goal: Risk for activity intolerance will decrease Outcome: Adequate for Discharge   Problem: Nutrition: Goal: Adequate nutrition will be maintained Outcome: Adequate for Discharge   Problem: Coping: Goal: Level of anxiety will decrease Outcome: Adequate for Discharge   Problem: Elimination: Goal: Will not experience complications related to bowel motility Outcome: Adequate for Discharge Goal: Will not experience complications related to urinary retention Outcome: Adequate for Discharge   Problem: Pain Managment: Goal: General experience of comfort will improve and/or be controlled Outcome: Adequate for Discharge   Problem: Safety: Goal: Ability to remain free from injury will improve Outcome: Adequate for Discharge   Problem: Skin Integrity: Goal: Risk for impaired skin integrity will decrease Outcome: Adequate for Discharge   Problem: Acute Rehab PT Goals(only PT should resolve) Goal: Pt Will Transfer Bed To Chair/Chair To Bed Outcome: Adequate for Discharge Goal: Pt/caregiver will Perform Home Exercise Program Outcome: Adequate for  Discharge Goal: PT Additional Goal #1 Outcome: Adequate for Discharge   Problem: Acute Rehab OT Goals (only OT should resolve) Goal: Pt. Will Perform Grooming Outcome: Adequate for Discharge Goal: Pt. Will Perform Upper Body Bathing Outcome: Adequate for Discharge Goal: Pt. Will Perform Lower Body Dressing Outcome: Adequate for Discharge Goal: Pt. Will Transfer To Toilet Outcome: Adequate for Discharge Goal: Pt. Will Perform Toileting-Clothing Manipulation Outcome: Adequate for Discharge   Problem: Education: Goal: Understanding of CV disease, CV risk reduction, and recovery process will improve Outcome: Adequate for Discharge Goal: Individualized Educational Video(s) Outcome: Adequate for Discharge   Problem: Activity: Goal: Ability to return to baseline activity level will improve Outcome: Adequate for Discharge   Problem: Cardiovascular: Goal: Ability to achieve and maintain adequate cardiovascular perfusion will improve Outcome: Adequate for Discharge Goal: Vascular access site(s) Level 0-1 will be maintained Outcome: Adequate for Discharge   Problem: Health Behavior/Discharge Planning: Goal: Ability to safely manage health-related needs after discharge will improve Outcome: Adequate for Discharge   Problem: Malnutrition  (NI-5.2) Goal: Food and/or nutrient delivery Description: Individualized approach for food/nutrient provision. Outcome: Adequate for Discharge

## 2023-03-27 NOTE — Plan of Care (Signed)
  Problem: Education: Goal: Knowledge of General Education information will improve Description: Including pain rating scale, medication(s)/side effects and non-pharmacologic comfort measures Outcome: Progressing   Problem: Health Behavior/Discharge Planning: Goal: Ability to manage health-related needs will improve Outcome: Not Progressing   Problem: Clinical Measurements: Goal: Ability to maintain clinical measurements within normal limits will improve Outcome: Progressing Goal: Will remain free from infection Outcome: Progressing Goal: Diagnostic test results will improve Outcome: Progressing Goal: Respiratory complications will improve Outcome: Progressing Goal: Cardiovascular complication will be avoided Outcome: Progressing   Problem: Activity: Goal: Risk for activity intolerance will decrease Outcome: Not Progressing   Problem: Nutrition: Goal: Adequate nutrition will be maintained Outcome: Progressing   Problem: Coping: Goal: Level of anxiety will decrease Outcome: Progressing   Problem: Elimination: Goal: Will not experience complications related to bowel motility Outcome: Progressing Goal: Will not experience complications related to urinary retention Outcome: Progressing   Problem: Pain Managment: Goal: General experience of comfort will improve and/or be controlled Outcome: Progressing   Problem: Safety: Goal: Ability to remain free from injury will improve Outcome: Progressing   Problem: Education: Goal: Understanding of CV disease, CV risk reduction, and recovery process will improve Outcome: Progressing Goal: Individualized Educational Video(s) Outcome: Progressing   Problem: Activity: Goal: Ability to return to baseline activity level will improve Outcome: Not Progressing   Problem: Cardiovascular: Goal: Ability to achieve and maintain adequate cardiovascular perfusion will improve Outcome: Progressing Goal: Vascular access site(s) Level 0-1  will be maintained Outcome: Progressing   Problem: Health Behavior/Discharge Planning: Goal: Ability to safely manage health-related needs after discharge will improve Outcome: Progressing

## 2023-03-29 ENCOUNTER — Telehealth: Payer: Self-pay | Admitting: Cardiovascular Disease

## 2023-03-29 ENCOUNTER — Other Ambulatory Visit: Payer: Self-pay | Admitting: Infectious Diseases

## 2023-03-29 ENCOUNTER — Telehealth: Payer: Self-pay | Admitting: *Deleted

## 2023-03-29 ENCOUNTER — Encounter: Admitting: Family

## 2023-03-29 NOTE — Telephone Encounter (Signed)
 Copied from CRM [id number]. Topic: Clinical - Home Health Verbal Orders >> Mar 29, 2023  4:08 PM Irine Seal wrote: Caller/Agency: cathy- [facility]  Callback Number: [phone] option 1 for new patient Service Requested: Physical Therapy, occupational therapy  Frequency: at patients request they want the new start of care for Wednesday, cathy stated that they already have the orders but to push the date they would need new so she didn't give exact frequency  Any new concerns about the patient? No

## 2023-03-29 NOTE — Telephone Encounter (Signed)
 Cathy with [facility] called in asking if they can have verbal HH orders to start on 03/30/23

## 2023-03-29 NOTE — Telephone Encounter (Signed)
 Left a message for the patient to call back.

## 2023-03-29 NOTE — Telephone Encounter (Signed)
 RTC to [facility].Message was left that Clinics had returned their call. Not a Clinic patient.

## 2023-03-31 NOTE — Telephone Encounter (Signed)
 I spoke with Lynden Ang from [facility] and made her aware that the patient has never been seen in our office. Lynden Ang acknowledged and expressed understanding

## 2023-04-02 ENCOUNTER — Encounter: Admitting: Family

## 2023-04-05 ENCOUNTER — Other Ambulatory Visit: Payer: Self-pay

## 2023-04-05 DIAGNOSIS — D649 Anemia, unspecified: Secondary | ICD-10-CM

## 2023-04-06 ENCOUNTER — Inpatient Hospital Stay: Attending: Oncology

## 2023-04-06 ENCOUNTER — Encounter: Payer: Self-pay | Admitting: Oncology

## 2023-04-06 ENCOUNTER — Inpatient Hospital Stay: Admitting: Oncology

## 2023-05-04 ENCOUNTER — Encounter: Payer: Self-pay | Admitting: Anesthesiology

## 2023-05-04 ENCOUNTER — Ambulatory Visit: Attending: Anesthesiology | Admitting: Anesthesiology

## 2023-05-04 DIAGNOSIS — M25552 Pain in left hip: Secondary | ICD-10-CM

## 2023-05-04 DIAGNOSIS — M5442 Lumbago with sciatica, left side: Secondary | ICD-10-CM | POA: Diagnosis not present

## 2023-05-04 DIAGNOSIS — M5441 Lumbago with sciatica, right side: Secondary | ICD-10-CM

## 2023-05-04 DIAGNOSIS — M25511 Pain in right shoulder: Secondary | ICD-10-CM

## 2023-05-04 DIAGNOSIS — Z79891 Long term (current) use of opiate analgesic: Secondary | ICD-10-CM

## 2023-05-04 DIAGNOSIS — M48062 Spinal stenosis, lumbar region with neurogenic claudication: Secondary | ICD-10-CM

## 2023-05-04 DIAGNOSIS — M25551 Pain in right hip: Secondary | ICD-10-CM

## 2023-05-04 DIAGNOSIS — G894 Chronic pain syndrome: Secondary | ICD-10-CM

## 2023-05-04 DIAGNOSIS — F1190 Opioid use, unspecified, uncomplicated: Secondary | ICD-10-CM

## 2023-05-04 DIAGNOSIS — M19011 Primary osteoarthritis, right shoulder: Secondary | ICD-10-CM

## 2023-05-04 DIAGNOSIS — M316 Other giant cell arteritis: Secondary | ICD-10-CM

## 2023-05-04 DIAGNOSIS — G8929 Other chronic pain: Secondary | ICD-10-CM

## 2023-05-04 MED ORDER — OXYCODONE-ACETAMINOPHEN 10-325 MG PO TABS: 1.0000 | ORAL_TABLET | Freq: Four times a day (QID) | ORAL | 0 refills | Status: AC | PRN

## 2023-05-04 NOTE — Progress Notes (Signed)
 Virtual Visit via Telephone Note  I connected with Wendy Sanchez on 05/04/23 at  1:20 PM EDT by telephone and verified that I am speaking with the correct person using two identifiers.  Location: Patient: Home Provider: Pain control center   I discussed the limitations, risks, security and privacy concerns of performing an evaluation and management service by telephone and the availability of in person appointments. I also discussed with the patient that there may be a patient responsible charge related to this service. The patient expressed understanding and agreed to proceed.   History of Present Illness: I spoke with Wendy Sanchez via telephone as we could not link for the video portion of the conference.  She has really been having a time of that lately with cardiac and kidney related problems requiring some recurrent hospitalizations.  Despite this she still is having a lot of trouble with her low back hips knees with chronic osteoarthritic pain and has been requiring chronic opioid therapy for pain relief.  She takes Percocet 10/15/2023 4 times a day and has been on this for a prolonged period of time to keep her pain under control.  This regimen has worked well for her.  Otherwise no change in the quality characteristic or distribution of the pain has been noted.  Unfortunately she does have some decubiti ulcer and is receiving home health assistance for the treatment of that.  She does have difficulty with ambulation and with mobility as well as attending in person appointments.  No other changes are noted in her pain complex.  She does get good relief rated at 50 to 70% with the Percocet lasting 4 to 6 hours and no subsequent side effects with the medication.  She has failed more conservative therapy.  Review of systems: General: No fevers or chills Pulmonary: No shortness of breath or dyspnea Cardiac: No angina or palpitations or lightheadedness GI: No abdominal pain or  constipation Psych: No depression    Observations/Objective:  Current Outpatient Medications:    oxyCODONE -acetaminophen  (PERCOCET) 10-325 MG tablet, Take 1 tablet by mouth every 6 (six) hours as needed for pain., Disp: 120 tablet, Rfl: 0   [START ON 06/03/2023] oxyCODONE -acetaminophen  (PERCOCET) 10-325 MG tablet, Take 1 tablet by mouth every 6 (six) hours as needed for pain., Disp: 120 tablet, Rfl: 0   acetaminophen  (TYLENOL ) 325 MG tablet, Take 2 tablets (650 mg total) by mouth every 6 (six) hours as needed for mild pain or fever., Disp: 60 tablet, Rfl: 0   albuterol  (VENTOLIN  HFA) 108 (90 Base) MCG/ACT inhaler, Inhale 2 puffs into the lungs every 6 (six) hours as needed for wheezing., Disp: , Rfl:    alendronate (FOSAMAX) 35 MG tablet, Take 1 tablet (35 mg total) by mouth every 7 (seven) days Take with a full glass of water . Do not lie down for the next 30 min., Disp: , Rfl:    amiodarone  (PACERONE ) 200 MG tablet, Take 1 tablet (200 mg total) by mouth daily., Disp: 30 tablet, Rfl: 0   apixaban  (ELIQUIS ) 5 MG TABS tablet, Take 1 tablet (5 mg total) by mouth 2 (two) times daily., Disp: 60 tablet, Rfl: 0   ascorbic acid  (VITAMIN C ) 500 MG tablet, Take 500 mg by mouth daily., Disp: , Rfl:    calcium  carbonate (OS-CAL - DOSED IN MG OF ELEMENTAL CALCIUM ) 1250 (500 Ca) MG tablet, Take 2 tablets by mouth in the morning., Disp: , Rfl:    Carboxymethylcellulose Sod PF 1 % GEL, Administer 2 drops  to both eyes Three (3) times a day., Disp: , Rfl:    Cholecalciferol  (VITAMIN D -1000 MAX ST) 25 MCG (1000 UT) tablet, Take 1,000 Units by mouth daily., Disp: , Rfl:    copper  tablet, Take 1 tablet (2 mg total) by mouth daily. (Patient not taking: Reported on 03/08/2023), Disp: , Rfl:    cyanocobalamin  (VITAMIN B12) 1000 MCG tablet, Take 1 tablet by mouth daily., Disp: , Rfl:    diphenhydrAMINE  (BENADRYL ) 25 mg capsule, Take 25 mg by mouth every 6 (six) hours as needed for itching or allergies., Disp: , Rfl:     docusate sodium  (COLACE) 100 MG capsule, Take 100 mg by mouth 2 (two) times daily as needed for mild constipation., Disp: , Rfl:    estradiol (ESTRACE) 0.1 MG/GM vaginal cream, Place 2 g vaginally 3 (three) times a week. Monday, Wednesday and Friday for recurrent UTI, Disp: , Rfl:    famotidine  (PEPCID ) 20 MG tablet, Take 20 mg by mouth 2 (two) times daily., Disp: , Rfl:    ferrous sulfate  325 (65 FE) MG tablet, Take 325 mg by mouth 2 (two) times daily with a meal., Disp: , Rfl:    fluticasone  (FLONASE ) 50 MCG/ACT nasal spray, Place 1 spray into both nostrils daily., Disp: , Rfl:    folic acid  (FOLVITE ) 1 MG tablet, Take 1 mg by mouth daily., Disp: , Rfl:    gabapentin  (NEURONTIN ) 300 MG capsule, Take 1 capsule (300 mg total) by mouth 3 (three) times daily., Disp: 90 capsule, Rfl: 3   hydrocortisone  (CORTEF ) 5 MG tablet, Take 3 tablets (15 mg total) by mouth every morning AND 1 tablet (5 mg total) every afternoon after lunch and before dinner, Disp: , Rfl:    ipratropium-albuterol  (DUONEB) 0.5-2.5 (3) MG/3ML SOLN, Take 3 mLs by nebulization every 6 (six) hours as needed (shortness of breath)., Disp: , Rfl:    lactose free nutrition (BOOST PLUS) LIQD, Take 237 mLs by mouth 3 (three) times daily between meals., Disp: , Rfl:    levocetirizine (XYZAL ) 5 MG tablet, Take 10 mg by mouth every evening., Disp: , Rfl:    melatonin 3 MG TABS tablet, Take 3 mg by mouth at bedtime as needed., Disp: , Rfl:    metoprolol  succinate (TOPROL -XL) 25 MG 24 hr tablet, Take 0.5 tablets (12.5 mg total) by mouth daily., Disp: 15 tablet, Rfl: 0   midodrine  (PROAMATINE ) 10 MG tablet, Take 5 mg by mouth 3 (three) times daily., Disp: , Rfl:    montelukast  (SINGULAIR ) 10 MG tablet, Take 10 mg by mouth daily., Disp: , Rfl:    Multiple Vitamin (MULTIVITAMIN WITH MINERALS) TABS tablet, Take 1 tablet by mouth daily., Disp: 30 tablet, Rfl: 0   nortriptyline  (PAMELOR ) 50 MG capsule, Take 50 mg by mouth at bedtime., Disp: , Rfl:     omeprazole (PRILOSEC OTC) 20 MG tablet, Take 20 mg by mouth daily., Disp: , Rfl:    ondansetron  (ZOFRAN ) 4 MG tablet, Take 1 tablet (4 mg total) by mouth every 6 (six) hours as needed for nausea., Disp: 20 tablet, Rfl: 0   polyethylene glycol (MIRALAX  / GLYCOLAX ) 17 g packet, Take 17 g by mouth daily as needed for moderate constipation., Disp: 14 each, Rfl: 0   potassium chloride  SA (KLOR-CON  M) 20 MEQ tablet, Take 2 tablets (40 mEq total) by mouth once for 1 dose., Disp: 30 tablet, Rfl: 0   pyridostigmine  (MESTINON ) 60 MG tablet, Take 0.5 tablets (30 mg total) by mouth 2 (two) times  daily., Disp: 60 tablet, Rfl: 0   spironolactone  (ALDACTONE ) 25 MG tablet, Take 0.5 tablets (12.5 mg total) by mouth daily., Disp: 30 tablet, Rfl: 0   thiamine  (VITAMIN B1) 100 MG tablet, Take 1 tablet by mouth daily., Disp: , Rfl:    torsemide  (DEMADEX ) 20 MG tablet, Take 1 tablet (20 mg total) by mouth daily., Disp: , Rfl:    triamcinolone cream (KENALOG) 0.1 %, Apply topically daily as needed., Disp: , Rfl:    vitamin A  3 MG (10000 UNITS) capsule, Take 1 capsule (10,000 Units total) by mouth daily. For 60 days, Disp: , Rfl:    zinc  sulfate, 50mg  elemental zinc , 220 (50 Zn) MG capsule, Take 1 capsule (220 mg total) by mouth daily. For 14 days (Patient not taking: Reported on 03/07/2023), Disp: , Rfl:  Past Medical History:  Diagnosis Date   Allergic rhinitis    Arthritis    Asthma    problems with fumes and aerosols cause asthma   Chest pain 10/19/2016   CHF (congestive heart failure) (HCC)    Complication of anesthesia    awakens during surgery; has occurred with last 3-4 surgeries    COPD (chronic obstructive pulmonary disease) (HCC)    DVT (deep venous thrombosis) (HCC) 07/13/2018   Right leg   Environmental allergies    fumes    Fibromyalgia    GERD (gastroesophageal reflux disease)    Headache    History of bronchitis    Hx of total knee arthroplasty 12/13/2015   Hyperlipidemia    Hypertension     Morbid obesity with BMI of 45.0-49.9, adult (HCC) 07/25/2015   NICM (nonischemic cardiomyopathy) (HCC)    a. ? PVC mediated;  b. 06/2016 Echo: EF 25-30%, mild LVH, mild LAE, mild AI/MR/TR/PR; c. 08/2016 Cath: nl cors, EF 25%.   Numbness    hands bilat when driving; improves when not preforming task    Osteoarthritis of left knee 08/02/2015   PE (pulmonary thromboembolism) (HCC) 07/13/2018   Pes anserinus bursitis of left knee 05/18/2016   Presence of right artificial knee joint 05/18/2016   PVC (premature ventricular contraction) 06/04/2017   PVC's (premature ventricular contractions)    a. 11/2016 Amio started;  b. 11/29/16 48h Holter: 57574 PVC's (41%); c. 12/2016 24h Holter: 18040 PVC's (15%); c. 02/2017 48h Holter: 37262 PVC's (41%).   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 Chronic pain syndrome   2. Chronic, continuous use of opioids   3. Chronic bilateral low back pain with bilateral sciatica   4. Hip pain, bilateral   5. Spinal stenosis of lumbar region with neurogenic claudication   6. Temporal arteritis (HCC)   7. Chronic pain of right hip   8. Primary osteoarthritis of right shoulder   9. Pain in joint of right shoulder    Based on our conversation and upon review of the [employer]  practitioner database information I think is appropriate to refill her medicines.  She has done very well with this regimen and been compliant.  She has failed more conservative therapy but does get good relief with the opioid medications  and denies any side effects.  Refills will be generated for April 22 and May 22.  No other changes are initiated.  She currently takes gabapentin   but has enough to last her till June.  I have requested a routine urine drug screen which is part of protocol to be done within the next month or 2.  Will schedule her for return to clinic in person if she is able to in 2 months.  Continue follow-up with her primary care physician for baseline medical care. Fol low Up Instructions:    I discussed the assessment and treatment plan with the patient. The patient was provided an opportunity to ask questions and all were answered. The patient agreed with the plan and demonstrated an understanding of the instructions.   The patient was advised to call back or seek an in-person evaluation if the symptoms worsen or if the condition fails to improve as anticipated.  I provided 30 minutes of non-face-to-face time during this encounter.   Zula Hitch, MD

## 2023-05-06 ENCOUNTER — Telehealth: Payer: Self-pay

## 2023-05-06 NOTE — Telephone Encounter (Signed)
 Spoke with pt re: need for PYP scan per Dr Doyle Generous hospital note 03/13/2023 as pt did not wish to schedule scan per scheduling.  Pt reports she has a stage-3 decubitus on her buttocks and sacral area.  She is receivng home health services for this.  She has had to cancel several provider appointments as she is unable to sit due to the pain of the ulcer.  She has an appointment scheduled 05/18/22 with a CHF clinic provider and will discuss the scan further at that time if she is able to keep that appointment.  Pt thanked Charity fundraiser for the call.        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

## 2023-05-18 ENCOUNTER — Encounter: Admitting: Family

## 2023-06-09 ENCOUNTER — Telehealth: Payer: Self-pay | Admitting: Anesthesiology

## 2023-06-09 NOTE — Telephone Encounter (Signed)
 Spoke with Wendy Sanchez's family about UDS. They thought the home health nurse could come in and bring a UDS. Vertis is unable to come into the clinic. She has a catheter and home nurse care. Not sure what Dr Welton Hall wants to do about UDS and patient coming in to the office. For Further information please call patient.

## 2023-07-01 ENCOUNTER — Other Ambulatory Visit: Payer: Self-pay

## 2023-07-01 ENCOUNTER — Inpatient Hospital Stay
Admission: EM | Admit: 2023-07-01 | Discharge: 2023-08-13 | DRG: 698 | Disposition: E | Attending: Internal Medicine | Admitting: Internal Medicine

## 2023-07-01 ENCOUNTER — Emergency Department

## 2023-07-01 DIAGNOSIS — R6520 Severe sepsis without septic shock: Secondary | ICD-10-CM | POA: Diagnosis present

## 2023-07-01 DIAGNOSIS — E272 Addisonian crisis: Secondary | ICD-10-CM | POA: Diagnosis present

## 2023-07-01 DIAGNOSIS — D696 Thrombocytopenia, unspecified: Secondary | ICD-10-CM | POA: Diagnosis present

## 2023-07-01 DIAGNOSIS — G039 Meningitis, unspecified: Secondary | ICD-10-CM | POA: Diagnosis not present

## 2023-07-01 DIAGNOSIS — Z66 Do not resuscitate: Secondary | ICD-10-CM | POA: Diagnosis present

## 2023-07-01 DIAGNOSIS — E870 Hyperosmolality and hypernatremia: Secondary | ICD-10-CM | POA: Diagnosis not present

## 2023-07-01 DIAGNOSIS — M797 Fibromyalgia: Secondary | ICD-10-CM | POA: Diagnosis present

## 2023-07-01 DIAGNOSIS — J4489 Other specified chronic obstructive pulmonary disease: Secondary | ICD-10-CM | POA: Diagnosis present

## 2023-07-01 DIAGNOSIS — G9341 Metabolic encephalopathy: Secondary | ICD-10-CM | POA: Diagnosis present

## 2023-07-01 DIAGNOSIS — J449 Chronic obstructive pulmonary disease, unspecified: Secondary | ICD-10-CM | POA: Diagnosis not present

## 2023-07-01 DIAGNOSIS — Z515 Encounter for palliative care: Secondary | ICD-10-CM

## 2023-07-01 DIAGNOSIS — R931 Abnormal findings on diagnostic imaging of heart and coronary circulation: Secondary | ICD-10-CM

## 2023-07-01 DIAGNOSIS — Z9049 Acquired absence of other specified parts of digestive tract: Secondary | ICD-10-CM

## 2023-07-01 DIAGNOSIS — I4892 Unspecified atrial flutter: Secondary | ICD-10-CM | POA: Diagnosis present

## 2023-07-01 DIAGNOSIS — E872 Acidosis, unspecified: Secondary | ICD-10-CM | POA: Diagnosis present

## 2023-07-01 DIAGNOSIS — I5043 Acute on chronic combined systolic (congestive) and diastolic (congestive) heart failure: Secondary | ICD-10-CM | POA: Diagnosis not present

## 2023-07-01 DIAGNOSIS — K219 Gastro-esophageal reflux disease without esophagitis: Secondary | ICD-10-CM | POA: Diagnosis present

## 2023-07-01 DIAGNOSIS — M316 Other giant cell arteritis: Secondary | ICD-10-CM | POA: Diagnosis present

## 2023-07-01 DIAGNOSIS — N390 Urinary tract infection, site not specified: Secondary | ICD-10-CM | POA: Diagnosis not present

## 2023-07-01 DIAGNOSIS — Z9884 Bariatric surgery status: Secondary | ICD-10-CM | POA: Diagnosis not present

## 2023-07-01 DIAGNOSIS — Z86711 Personal history of pulmonary embolism: Secondary | ICD-10-CM | POA: Diagnosis not present

## 2023-07-01 DIAGNOSIS — I472 Ventricular tachycardia, unspecified: Secondary | ICD-10-CM | POA: Diagnosis present

## 2023-07-01 DIAGNOSIS — E559 Vitamin D deficiency, unspecified: Secondary | ICD-10-CM | POA: Diagnosis not present

## 2023-07-01 DIAGNOSIS — Z7983 Long term (current) use of bisphosphonates: Secondary | ICD-10-CM

## 2023-07-01 DIAGNOSIS — T361X5A Adverse effect of cephalosporins and other beta-lactam antibiotics, initial encounter: Secondary | ICD-10-CM | POA: Diagnosis present

## 2023-07-01 DIAGNOSIS — D638 Anemia in other chronic diseases classified elsewhere: Secondary | ICD-10-CM | POA: Diagnosis present

## 2023-07-01 DIAGNOSIS — T426X1A Poisoning by other antiepileptic and sedative-hypnotic drugs, accidental (unintentional), initial encounter: Secondary | ICD-10-CM | POA: Diagnosis not present

## 2023-07-01 DIAGNOSIS — I43 Cardiomyopathy in diseases classified elsewhere: Secondary | ICD-10-CM | POA: Diagnosis present

## 2023-07-01 DIAGNOSIS — I480 Paroxysmal atrial fibrillation: Secondary | ICD-10-CM | POA: Diagnosis not present

## 2023-07-01 DIAGNOSIS — Z96653 Presence of artificial knee joint, bilateral: Secondary | ICD-10-CM | POA: Diagnosis present

## 2023-07-01 DIAGNOSIS — I959 Hypotension, unspecified: Secondary | ICD-10-CM | POA: Diagnosis not present

## 2023-07-01 DIAGNOSIS — Z91048 Other nonmedicinal substance allergy status: Secondary | ICD-10-CM

## 2023-07-01 DIAGNOSIS — E871 Hypo-osmolality and hyponatremia: Secondary | ICD-10-CM | POA: Diagnosis not present

## 2023-07-01 DIAGNOSIS — R0902 Hypoxemia: Secondary | ICD-10-CM | POA: Diagnosis not present

## 2023-07-01 DIAGNOSIS — Z7901 Long term (current) use of anticoagulants: Secondary | ICD-10-CM

## 2023-07-01 DIAGNOSIS — Z820 Family history of epilepsy and other diseases of the nervous system: Secondary | ICD-10-CM

## 2023-07-01 DIAGNOSIS — E440 Moderate protein-calorie malnutrition: Secondary | ICD-10-CM | POA: Diagnosis present

## 2023-07-01 DIAGNOSIS — Z6841 Body Mass Index (BMI) 40.0 and over, adult: Secondary | ICD-10-CM

## 2023-07-01 DIAGNOSIS — T83511A Infection and inflammatory reaction due to indwelling urethral catheter, initial encounter: Secondary | ICD-10-CM | POA: Diagnosis present

## 2023-07-01 DIAGNOSIS — R253 Fasciculation: Secondary | ICD-10-CM | POA: Diagnosis not present

## 2023-07-01 DIAGNOSIS — Z91013 Allergy to seafood: Secondary | ICD-10-CM

## 2023-07-01 DIAGNOSIS — Z832 Family history of diseases of the blood and blood-forming organs and certain disorders involving the immune mechanism: Secondary | ICD-10-CM

## 2023-07-01 DIAGNOSIS — G629 Polyneuropathy, unspecified: Secondary | ICD-10-CM | POA: Diagnosis present

## 2023-07-01 DIAGNOSIS — Y846 Urinary catheterization as the cause of abnormal reaction of the patient, or of later complication, without mention of misadventure at the time of the procedure: Secondary | ICD-10-CM | POA: Diagnosis present

## 2023-07-01 DIAGNOSIS — Z803 Family history of malignant neoplasm of breast: Secondary | ICD-10-CM

## 2023-07-01 DIAGNOSIS — Z5982 Transportation insecurity: Secondary | ICD-10-CM

## 2023-07-01 DIAGNOSIS — R569 Unspecified convulsions: Secondary | ICD-10-CM | POA: Diagnosis not present

## 2023-07-01 DIAGNOSIS — Z8249 Family history of ischemic heart disease and other diseases of the circulatory system: Secondary | ICD-10-CM

## 2023-07-01 DIAGNOSIS — B9620 Unspecified Escherichia coli [E. coli] as the cause of diseases classified elsewhere: Secondary | ICD-10-CM | POA: Diagnosis present

## 2023-07-01 DIAGNOSIS — A4150 Gram-negative sepsis, unspecified: Principal | ICD-10-CM | POA: Diagnosis present

## 2023-07-01 DIAGNOSIS — R471 Dysarthria and anarthria: Secondary | ICD-10-CM | POA: Diagnosis not present

## 2023-07-01 DIAGNOSIS — D75838 Other thrombocytosis: Secondary | ICD-10-CM | POA: Diagnosis present

## 2023-07-01 DIAGNOSIS — B965 Pseudomonas (aeruginosa) (mallei) (pseudomallei) as the cause of diseases classified elsewhere: Secondary | ICD-10-CM | POA: Diagnosis present

## 2023-07-01 DIAGNOSIS — I471 Supraventricular tachycardia, unspecified: Secondary | ICD-10-CM | POA: Diagnosis not present

## 2023-07-01 DIAGNOSIS — E785 Hyperlipidemia, unspecified: Secondary | ICD-10-CM | POA: Diagnosis present

## 2023-07-01 DIAGNOSIS — I110 Hypertensive heart disease with heart failure: Secondary | ICD-10-CM | POA: Diagnosis present

## 2023-07-01 DIAGNOSIS — I428 Other cardiomyopathies: Secondary | ICD-10-CM | POA: Diagnosis present

## 2023-07-01 DIAGNOSIS — I493 Ventricular premature depolarization: Secondary | ICD-10-CM | POA: Diagnosis present

## 2023-07-01 DIAGNOSIS — A419 Sepsis, unspecified organism: Secondary | ICD-10-CM | POA: Diagnosis not present

## 2023-07-01 DIAGNOSIS — Z7401 Bed confinement status: Secondary | ICD-10-CM

## 2023-07-01 DIAGNOSIS — T3691XA Poisoning by unspecified systemic antibiotic, accidental (unintentional), initial encounter: Secondary | ICD-10-CM | POA: Diagnosis not present

## 2023-07-01 DIAGNOSIS — Z881 Allergy status to other antibiotic agents status: Secondary | ICD-10-CM

## 2023-07-01 DIAGNOSIS — L89153 Pressure ulcer of sacral region, stage 3: Secondary | ICD-10-CM | POA: Diagnosis present

## 2023-07-01 DIAGNOSIS — Z86718 Personal history of other venous thrombosis and embolism: Secondary | ICD-10-CM

## 2023-07-01 DIAGNOSIS — R531 Weakness: Secondary | ICD-10-CM

## 2023-07-01 DIAGNOSIS — Z91041 Radiographic dye allergy status: Secondary | ICD-10-CM

## 2023-07-01 DIAGNOSIS — E162 Hypoglycemia, unspecified: Secondary | ICD-10-CM | POA: Diagnosis not present

## 2023-07-01 DIAGNOSIS — G9340 Encephalopathy, unspecified: Secondary | ICD-10-CM

## 2023-07-01 DIAGNOSIS — Z885 Allergy status to narcotic agent status: Secondary | ICD-10-CM

## 2023-07-01 DIAGNOSIS — Z91010 Allergy to peanuts: Secondary | ICD-10-CM

## 2023-07-01 DIAGNOSIS — Z720 Tobacco use: Secondary | ICD-10-CM

## 2023-07-01 DIAGNOSIS — Z833 Family history of diabetes mellitus: Secondary | ICD-10-CM

## 2023-07-01 DIAGNOSIS — I4719 Other supraventricular tachycardia: Secondary | ICD-10-CM | POA: Diagnosis present

## 2023-07-01 DIAGNOSIS — J690 Pneumonitis due to inhalation of food and vomit: Secondary | ICD-10-CM | POA: Diagnosis not present

## 2023-07-01 DIAGNOSIS — Z79899 Other long term (current) drug therapy: Secondary | ICD-10-CM

## 2023-07-01 DIAGNOSIS — Z91018 Allergy to other foods: Secondary | ICD-10-CM

## 2023-07-01 DIAGNOSIS — Z933 Colostomy status: Secondary | ICD-10-CM

## 2023-07-01 DIAGNOSIS — N179 Acute kidney failure, unspecified: Secondary | ICD-10-CM | POA: Diagnosis not present

## 2023-07-01 DIAGNOSIS — R410 Disorientation, unspecified: Secondary | ICD-10-CM | POA: Diagnosis not present

## 2023-07-01 DIAGNOSIS — R55 Syncope and collapse: Secondary | ICD-10-CM

## 2023-07-01 DIAGNOSIS — E66812 Obesity, class 2: Secondary | ICD-10-CM | POA: Diagnosis present

## 2023-07-01 DIAGNOSIS — E876 Hypokalemia: Secondary | ICD-10-CM | POA: Diagnosis not present

## 2023-07-01 LAB — BLOOD GAS, ARTERIAL
Acid-Base Excess: 4.9 mmol/L — ABNORMAL HIGH (ref 0.0–2.0)
Bicarbonate: 29.2 mmol/L — ABNORMAL HIGH (ref 20.0–28.0)
O2 Content: 2 L/min
O2 Saturation: 99.8 %
Patient temperature: 37
pCO2 arterial: 41 mmHg (ref 32–48)
pH, Arterial: 7.46 — ABNORMAL HIGH (ref 7.35–7.45)
pO2, Arterial: 131 mmHg — ABNORMAL HIGH (ref 83–108)

## 2023-07-01 LAB — URINALYSIS, ROUTINE W REFLEX MICROSCOPIC
Bilirubin Urine: NEGATIVE
Glucose, UA: NEGATIVE mg/dL
Hgb urine dipstick: NEGATIVE
Ketones, ur: NEGATIVE mg/dL
Nitrite: POSITIVE — AB
Protein, ur: NEGATIVE mg/dL
Specific Gravity, Urine: 1.004 — ABNORMAL LOW (ref 1.005–1.030)
Squamous Epithelial / HPF: 0 /HPF (ref 0–5)
pH: 7 (ref 5.0–8.0)

## 2023-07-01 LAB — CBG MONITORING, ED: Glucose-Capillary: 103 mg/dL — ABNORMAL HIGH (ref 70–99)

## 2023-07-01 LAB — BASIC METABOLIC PANEL WITH GFR
Anion gap: 10 (ref 5–15)
BUN: 13 mg/dL (ref 8–23)
CO2: 24 mmol/L (ref 22–32)
Calcium: 7.6 mg/dL — ABNORMAL LOW (ref 8.9–10.3)
Chloride: 101 mmol/L (ref 98–111)
Creatinine, Ser: 0.73 mg/dL (ref 0.44–1.00)
GFR, Estimated: >60 mL/min (ref >60)
Glucose, Bld: 111 mg/dL — ABNORMAL HIGH (ref 70–99)
Potassium: 4.3 mmol/L (ref 3.5–5.1)
Sodium: 135 mmol/L (ref 135–145)

## 2023-07-01 LAB — CBC
HCT: 31.5 % — ABNORMAL LOW (ref 36.0–46.0)
Hemoglobin: 10.2 g/dL — ABNORMAL LOW (ref 12.0–15.0)
MCH: 26.6 pg (ref 26.0–34.0)
MCHC: 32.4 g/dL (ref 30.0–36.0)
MCV: 82 fL (ref 80.0–100.0)
Platelets: 588 10*3/uL — ABNORMAL HIGH (ref 150–400)
RBC: 3.84 MIL/uL — ABNORMAL LOW (ref 3.87–5.11)
RDW: 18.9 % — ABNORMAL HIGH (ref 11.5–15.5)
WBC: 12.1 10*3/uL — ABNORMAL HIGH (ref 4.0–10.5)
nRBC: 0 % (ref 0.0–0.2)

## 2023-07-01 LAB — CREATININE, SERUM
Creatinine, Ser: 0.63 mg/dL (ref 0.44–1.00)
GFR, Estimated: >60 mL/min (ref >60)

## 2023-07-01 LAB — TROPONIN I (HIGH SENSITIVITY): Troponin I (High Sensitivity): 30 ng/L — ABNORMAL HIGH

## 2023-07-01 LAB — LACTIC ACID, PLASMA: Lactic Acid, Venous: 1.7 mmol/L (ref 0.5–1.9)

## 2023-07-01 MED ORDER — ALBUTEROL SULFATE HFA 108 (90 BASE) MCG/ACT IN AERS: 2.0000 | INHALATION_SPRAY | Freq: Four times a day (QID) | RESPIRATORY_TRACT | Status: DC | PRN

## 2023-07-01 MED ORDER — APIXABAN 5 MG PO TABS
5.0000 mg | ORAL_TABLET | Freq: Two times a day (BID) | ORAL | Status: DC
  Administered 2023-07-01 – 2023-07-05 (×8): 5 mg via ORAL
  Filled 2023-07-01 (×8): qty 1

## 2023-07-01 MED ORDER — FERROUS SULFATE 325 (65 FE) MG PO TABS
325.0000 mg | ORAL_TABLET | Freq: Two times a day (BID) | ORAL | Status: DC
  Administered 2023-07-02 – 2023-07-05 (×7): 325 mg via ORAL
  Filled 2023-07-01 (×8): qty 1

## 2023-07-01 MED ORDER — MAGNESIUM HYDROXIDE 400 MG/5ML PO SUSP: 30.0000 mL | Freq: Every day | ORAL | Status: DC | PRN

## 2023-07-01 MED ORDER — VITAMIN C 500 MG PO TABS
500.0000 mg | ORAL_TABLET | Freq: Every day | ORAL | Status: DC
  Administered 2023-07-02 – 2023-07-04 (×3): 500 mg via ORAL
  Filled 2023-07-01 (×4): qty 1

## 2023-07-01 MED ORDER — ONDANSETRON HCL 4 MG/2ML IJ SOLN
4.0000 mg | Freq: Four times a day (QID) | INTRAMUSCULAR | Status: DC | PRN
  Administered 2023-07-04: 4 mg via INTRAVENOUS
  Filled 2023-07-01: qty 2

## 2023-07-01 MED ORDER — TORSEMIDE 20 MG PO TABS
10.0000 mg | ORAL_TABLET | Freq: Every day | ORAL | Status: DC
  Administered 2023-07-02 – 2023-07-10 (×6): 10 mg via ORAL
  Filled 2023-07-01 (×2): qty 1
  Filled 2023-07-01: qty 0.5
  Filled 2023-07-01 (×5): qty 1

## 2023-07-01 MED ORDER — POLYETHYLENE GLYCOL 3350 17 G PO PACK: 17.0000 g | PACK | Freq: Every day | ORAL | Status: DC | PRN

## 2023-07-01 MED ORDER — VITAMIN D 25 MCG (1000 UNIT) PO TABS
1000.0000 [IU] | ORAL_TABLET | Freq: Every day | ORAL | Status: DC
  Administered 2023-07-02 – 2023-07-04 (×3): 1000 [IU] via ORAL
  Filled 2023-07-01 (×4): qty 1

## 2023-07-01 MED ORDER — OXYCODONE-ACETAMINOPHEN 10-325 MG PO TABS: 1.0000 | ORAL_TABLET | Freq: Four times a day (QID) | ORAL | Status: DC | PRN

## 2023-07-01 MED ORDER — VITAMIN A 3 MG (10000 UNIT) PO CAPS
10000.0000 [IU] | ORAL_CAPSULE | Freq: Every day | ORAL | Status: DC
  Administered 2023-07-02 – 2023-07-04 (×3): 10000 [IU] via ORAL
  Filled 2023-07-01 (×6): qty 1

## 2023-07-01 MED ORDER — ACETAMINOPHEN 325 MG PO TABS
650.0000 mg | ORAL_TABLET | Freq: Four times a day (QID) | ORAL | Status: DC | PRN
  Administered 2023-07-02 – 2023-07-05 (×2): 650 mg via ORAL
  Filled 2023-07-01 (×2): qty 2

## 2023-07-01 MED ORDER — MELATONIN 5 MG PO TABS: 5.0000 mg | ORAL_TABLET | Freq: Every evening | ORAL | Status: DC | PRN

## 2023-07-01 MED ORDER — THIAMINE MONONITRATE 100 MG PO TABS
100.0000 mg | ORAL_TABLET | Freq: Every day | ORAL | Status: DC
  Administered 2023-07-02 – 2023-07-04 (×3): 100 mg via ORAL
  Filled 2023-07-01 (×4): qty 1

## 2023-07-01 MED ORDER — ACETAMINOPHEN 650 MG RE SUPP: 650.0000 mg | Freq: Four times a day (QID) | RECTAL | Status: DC | PRN

## 2023-07-01 MED ORDER — DIPHENHYDRAMINE HCL 25 MG PO CAPS: 25.0000 mg | ORAL_CAPSULE | Freq: Four times a day (QID) | ORAL | Status: DC | PRN

## 2023-07-01 MED ORDER — SODIUM CHLORIDE 0.9 % IV BOLUS
1000.0000 mL | Freq: Once | INTRAVENOUS | Status: AC
  Administered 2023-07-01: 1000 mL via INTRAVENOUS

## 2023-07-01 MED ORDER — AMIODARONE HCL 200 MG PO TABS: 200.0000 mg | ORAL_TABLET | Freq: Every day | ORAL | Status: DC

## 2023-07-01 MED ORDER — CETIRIZINE HCL 10 MG PO TABS
10.0000 mg | ORAL_TABLET | Freq: Every evening | ORAL | Status: DC
  Administered 2023-07-01 – 2023-07-04 (×4): 10 mg via ORAL
  Filled 2023-07-01 (×7): qty 1

## 2023-07-01 MED ORDER — SODIUM CHLORIDE 0.9 % IV SOLN
1.0000 g | Freq: Once | INTRAVENOUS | Status: AC
  Administered 2023-07-01: 1 g via INTRAVENOUS
  Filled 2023-07-01: qty 10

## 2023-07-01 MED ORDER — PANTOPRAZOLE SODIUM 40 MG PO TBEC
40.0000 mg | DELAYED_RELEASE_TABLET | Freq: Every morning | ORAL | Status: DC
  Administered 2023-07-02 – 2023-07-04 (×3): 40 mg via ORAL
  Filled 2023-07-01 (×3): qty 1

## 2023-07-01 MED ORDER — OXYCODONE HCL 5 MG PO TABS
5.0000 mg | ORAL_TABLET | Freq: Four times a day (QID) | ORAL | Status: DC | PRN
  Administered 2023-07-01 – 2023-07-13 (×10): 5 mg via ORAL
  Filled 2023-07-01 (×10): qty 1

## 2023-07-01 MED ORDER — VITAMIN B-12 1000 MCG PO TABS
1000.0000 ug | ORAL_TABLET | Freq: Every day | ORAL | Status: DC
  Administered 2023-07-02 – 2023-07-04 (×3): 1000 ug via ORAL
  Filled 2023-07-01: qty 2
  Filled 2023-07-01 (×3): qty 1

## 2023-07-01 MED ORDER — MONTELUKAST SODIUM 10 MG PO TABS
10.0000 mg | ORAL_TABLET | Freq: Every evening | ORAL | Status: DC
  Administered 2023-07-01 – 2023-07-05 (×5): 10 mg via ORAL
  Filled 2023-07-01 (×5): qty 1

## 2023-07-01 MED ORDER — GABAPENTIN 300 MG PO CAPS
300.0000 mg | ORAL_CAPSULE | Freq: Three times a day (TID) | ORAL | Status: DC
  Administered 2023-07-01 – 2023-07-05 (×12): 300 mg via ORAL
  Filled 2023-07-01 (×12): qty 1

## 2023-07-01 MED ORDER — SODIUM CHLORIDE 0.9 % IV SOLN
2.0000 g | Freq: Once | INTRAVENOUS | Status: AC
  Administered 2023-07-01: 2 g via INTRAVENOUS
  Filled 2023-07-01: qty 12.5

## 2023-07-01 MED ORDER — OXYCODONE-ACETAMINOPHEN 5-325 MG PO TABS
1.0000 | ORAL_TABLET | Freq: Four times a day (QID) | ORAL | Status: DC | PRN
  Administered 2023-07-01 – 2023-07-13 (×12): 1 via ORAL
  Filled 2023-07-01 (×12): qty 1

## 2023-07-01 MED ORDER — MIDODRINE HCL 5 MG PO TABS
5.0000 mg | ORAL_TABLET | Freq: Once | ORAL | Status: AC
  Administered 2023-07-01: 5 mg via ORAL
  Filled 2023-07-01: qty 1

## 2023-07-01 MED ORDER — MIDODRINE HCL 5 MG PO TABS
5.0000 mg | ORAL_TABLET | Freq: Three times a day (TID) | ORAL | Status: DC
  Administered 2023-07-02 – 2023-07-05 (×12): 5 mg via ORAL
  Filled 2023-07-01 (×12): qty 1

## 2023-07-01 MED ORDER — ADULT MULTIVITAMIN W/MINERALS CH
1.0000 | ORAL_TABLET | Freq: Every day | ORAL | Status: DC
  Administered 2023-07-02 – 2023-07-04 (×3): 1 via ORAL
  Filled 2023-07-01 (×4): qty 1

## 2023-07-01 MED ORDER — ALBUTEROL SULFATE (2.5 MG/3ML) 0.083% IN NEBU
2.5000 mg | INHALATION_SOLUTION | Freq: Four times a day (QID) | RESPIRATORY_TRACT | Status: DC | PRN
  Administered 2023-07-11: 2.5 mg via RESPIRATORY_TRACT
  Filled 2023-07-01: qty 3

## 2023-07-01 MED ORDER — SODIUM CHLORIDE 0.9 % IV SOLN: 2.0000 g | Freq: Three times a day (TID) | INTRAVENOUS | Status: DC

## 2023-07-01 MED ORDER — ENOXAPARIN SODIUM 40 MG/0.4ML IJ SOSY: 40.0000 mg | PREFILLED_SYRINGE | INTRAMUSCULAR | Status: DC

## 2023-07-01 MED ORDER — FLUCONAZOLE 100 MG PO TABS
100.0000 mg | ORAL_TABLET | Freq: Every day | ORAL | Status: DC
  Administered 2023-07-02 – 2023-07-05 (×4): 100 mg via ORAL
  Filled 2023-07-01 (×6): qty 1

## 2023-07-01 MED ORDER — ONDANSETRON HCL 4 MG PO TABS: 4.0000 mg | ORAL_TABLET | Freq: Four times a day (QID) | ORAL | Status: DC | PRN

## 2023-07-01 MED ORDER — LACTATED RINGERS IV SOLN
150.0000 mL/h | INTRAVENOUS | Status: AC
  Administered 2023-07-01 – 2023-07-02 (×3): 150 mL/h via INTRAVENOUS

## 2023-07-01 MED ORDER — FOLIC ACID 1 MG PO TABS
1.0000 mg | ORAL_TABLET | Freq: Every day | ORAL | Status: DC
  Administered 2023-07-02 – 2023-07-04 (×3): 1 mg via ORAL
  Filled 2023-07-01 (×4): qty 1

## 2023-07-01 MED ORDER — TRAZODONE HCL 50 MG PO TABS: 25.0000 mg | ORAL_TABLET | Freq: Every evening | ORAL | Status: DC | PRN

## 2023-07-01 MED ORDER — CALCIUM CARBONATE 1250 (500 CA) MG PO TABS: 2.0000 | ORAL_TABLET | Freq: Every morning | ORAL | Status: DC

## 2023-07-01 NOTE — ED Triage Notes (Signed)
 Pt to ED via [facility] from home. Pt lives at home and is cared for by family. Pt is bed bound. Pt reports possible UTI. Pt has foley cath and colostomy. Pt is hypotensive in triage and reports is on midodrine   PRN. Pt also reports sacral pressure injury. Pt is A&Ox4.

## 2023-07-01 NOTE — Assessment & Plan Note (Signed)
-   This could be partly related to severe sepsis in addition to her hypotension. - This could be contributing to her syncope. - We will continue her midodrine .

## 2023-07-01 NOTE — Assessment & Plan Note (Signed)
-   Given her somnolence we are checking ABG level. - Will continue her as needed DuoNebs.

## 2023-07-01 NOTE — Assessment & Plan Note (Signed)
-   Will continue Neurontin  while holding off for sedation.

## 2023-07-01 NOTE — Assessment & Plan Note (Addendum)
 The patient will continue PPI therapy as well as H2 blocker therapy.

## 2023-07-01 NOTE — ED Notes (Signed)
 Pt became unresponsive. Hendrick Locke MD at bedside. BGL and BP checked. Pt becoming more responsive. O2 applied for comfort.

## 2023-07-01 NOTE — ED Provider Notes (Signed)
 [facility] Provider Note    Event Date/Time   First MD Initiated Contact with Patient 07/01/23 1610     (approximate)   History   Weakness   HPI  Wendy Sanchez is a 73 y.o. female  who presents to the emergency department today because of concerns for weakness and possible urinary tract infection.  The patient states that she has been feeling weak for quite some time although it seems to be getting worse recently.  She does have a Foley catheter and states that it was changed yesterday after the urine was noted to be cloudy and with sediment.  However the urine continued to have sediment and continued to be cloudy today.  In addition the patient has a sacral wound which she says is being cared for.  She is concerned because it feels like it is getting bigger.  She denies any pain to that area..      Physical Exam   Triage Vital Signs: ED Triage Vitals  Encounter Vitals Group     BP 07/01/23 1539 (!) 89/70     Girls Systolic BP Percentile --      Girls Diastolic BP Percentile --      Boys Systolic BP Percentile --      Boys Diastolic BP Percentile --      Pulse Rate 07/01/23 1539 (!) 52     Resp 07/01/23 1539 17     Temp 07/01/23 1539 98.6 F (37 C)     Temp Source 07/01/23 1539 Oral     SpO2 07/01/23 1539 100 %     Weight --      Height 07/01/23 1540 5' 8 (1.727 m)     Head Circumference --      Peak Flow --      Pain Score 07/01/23 1540 7     Pain Loc --      Pain Education --      Exclude from Growth Chart --     Most recent vital signs: Vitals:   07/01/23 1539  BP: (!) 89/70  Pulse: (!) 52  Resp: 17  Temp: 98.6 F (37 C)  SpO2: 100%   General: Awake, alert, oriented. CV:  Good peripheral perfusion. Regular rate and rhythm. Resp:  Normal effort. Lungs clear. Abd:  No distention.  Other:  Sacral wound roughly 3 cm in diameter, no surrounding erythema, no foul odor. Appears to have granulation tissue present.    ED Results /  Procedures / Treatments   Labs (all labs ordered are listed, but only abnormal results are displayed) Labs Reviewed  URINALYSIS, ROUTINE W REFLEX MICROSCOPIC - Abnormal; Notable for the following components:      Result Value   Color, Urine YELLOW (*)    APPearance HAZY (*)    Specific Gravity, Urine 1.004 (*)    Nitrite POSITIVE (*)    Leukocytes,Ua LARGE (*)    Bacteria, UA MANY (*)    All other components within normal limits  BASIC METABOLIC PANEL WITH GFR - Abnormal; Notable for the following components:   Glucose, Bld 111 (*)    Calcium  7.6 (*)    All other components within normal limits  CBC - Abnormal; Notable for the following components:   WBC 12.1 (*)    RBC 3.84 (*)    Hemoglobin 10.2 (*)    HCT 31.5 (*)    RDW 18.9 (*)    Platelets 588 (*)    All other  components within normal limits  BLOOD GAS, ARTERIAL - Abnormal; Notable for the following components:   pH, Arterial 7.46 (*)    pO2, Arterial 131 (*)    Bicarbonate 29.2 (*)    Acid-Base Excess 4.9 (*)    All other components within normal limits  CBG MONITORING, ED - Abnormal; Notable for the following components:   Glucose-Capillary 103 (*)    All other components within normal limits  TROPONIN I (HIGH SENSITIVITY) - Abnormal; Notable for the following components:   Troponin I (High Sensitivity) 30 (*)    All other components within normal limits  CULTURE, BLOOD (ROUTINE X 2)  CULTURE, BLOOD (ROUTINE X 2)  URINE CULTURE  AEROBIC/ANAEROBIC CULTURE W GRAM STAIN (SURGICAL/DEEP WOUND)  LACTIC ACID, PLASMA  CREATININE, SERUM  PROTIME-INR  CORTISOL-AM, BLOOD  BASIC METABOLIC PANEL WITH GFR  CBC     EKG  I, Marylynn Soho, attending physician, personally viewed and interpreted this EKG  EKG Time: 2039 Rate: 97 Rhythm: sinus rhythm with PAC Axis: normal Intervals: qtc 472 QRS: narrow ST changes: no st elevation Impression: abnormal ekg  RADIOLOGY I independently interpreted and visualized the  CT head. My interpretation: No ICH Radiology interpretation:  IMPRESSION:  1. Generalized cerebral atrophy and microvascular disease changes of  the supratentorial brain.  2. No acute intracranial abnormality.     PROCEDURES:  Critical Care performed: No    MEDICATIONS ORDERED IN ED: Medications  sodium chloride  0.9 % bolus 1,000 mL (has no administration in time range)     IMPRESSION / MDM / ASSESSMENT AND PLAN / ED COURSE  I reviewed the triage vital signs and the nursing notes.                              Differential diagnosis includes, but is not limited to, UTI, anemia, electrolyte abnormality  Patient's presentation is most consistent with acute presentation with potential threat to life or bodily function.   The patient is on the cardiac monitor to evaluate for evidence of arrhythmia and/or significant heart rate changes.  Patient presented to the emergency department today because of concerns for worsening weakness and possible urinary tract infection.  Patient was slightly hypotensive here in the emergency department.  She was given IV fluids as well as dose of her midodrine .  Her blood pressure did improve.  Workup showed slight leukocytosis in her serum and UA is consistent with infection.  Patient was given IV antibiotics.  While awaiting evaluation admission by the hospitalist the patient had what potentially could have been a syncopal or seizure-like episode.  This was not witnessed by staff but family states that they noticed her eyes rolled back and that she started to have some shaking.  By the time staff got there she had no further shaking and she did become responsive quickly.  She did not appear postictal on my exam.  I did however obtain a head CT which did not show any acute abnormality.  This time do wonder if patient might of had a syncopal episode.  Will continue to monitor.  I did discuss patient with Dr. Achilles Holes with the hospitalist service for  admission.      FINAL CLINICAL IMPRESSION(S) / ED DIAGNOSES   Final diagnoses:  Lower urinary tract infection  Weakness     Note:  This document was prepared using Dragon voice recognition software and may include unintentional dictation errors.    Hendrick Locke,  Aurea Leek, MD 07/02/23 [phone]

## 2023-07-01 NOTE — ED Notes (Signed)
 Per Mansy MD, do not need to repeat EKG.

## 2023-07-01 NOTE — Assessment & Plan Note (Signed)
-   Will check an EKG. - Will continue  Eliquis . - Amiodarone  will be held off given her bradycardia.  It has not been filled for a while.

## 2023-07-01 NOTE — Assessment & Plan Note (Signed)
-   We will monitor for arrhythmias. - Will check orthostatics q 12 hours. - Will obtain a bilateral carotid Doppler and 2D echo. - The patient will be hydrated with lactated ringer . - Neurochecks will be followed and she will be placed on seizures precautions. - Neurology consult will be obtained. - I notified Dr. Doretta Gant about the patient. -Differential diagnoses would include neurally mediated syncope, cardiogenic, arrhythmias related,  orthostatic hypotension and less likely hypoglycemia.

## 2023-07-01 NOTE — Assessment & Plan Note (Signed)
-   Sepsis manifested by leukocytosis, tachycardia and tachypnea. - Patient will be admitted to a medical telemetry bed. - Will continue antibiotic therapy with IV cefepime  given her chronic Foley catheter. - Will follow blood and urine cultures. - The patient will be hydrated with IV lactated ringer . - It is less likely that the sepsis is secondary to her deep sacral decubitus ulcer. - Would add IV vancomycin  for now. - Wound care consult will be obtained.

## 2023-07-01 NOTE — H&P (Incomplete)
 [name]   PATIENT NAME: Wendy Sanchez    MR#:  [id number]  DATE OF BIRTH:  Jun 01, 1950  DATE OF ADMISSION:  07/01/2023  PRIMARY CARE PHYSICIAN: Pcp, No   Patient is coming from: Home  REQUESTING/REFERRING PHYSICIAN: Marylynn Soho, MD  CHIEF COMPLAINT:  No chief complaint on file.   HISTORY OF PRESENT ILLNESS:  Wendy Sanchez is a 73 y.o. female with medical history significant for asthma, osteoarthritis, giant cell arthritis, combined systolic and diastolic CHF, COPD, DVT, PE and atrial fibrillation on Eliquis , who presented to the emergency room with acute onset of increased weakness.  She was noted to have sediment in her Foley catheter that was changed yesterday however that continued today.  There was a concern about a sacral wound that felt to be tunneling deeper.  The patient had a brief syncopal episode in the ER when her family noted that she was mildly shaking and her eyes rolled back.  She did not appear to to be postictal when she was seen by the EDP.  No reported fever or chills.  No reported nausea or vomiting or abdominal pain.  No reported chest pain or palpitations.  No cough or wheezing or dyspnea.  No bleeding diathesis.  ED Course: When she came to the ER, BP was 89/70 and later on 108/63 with a heart rate of 52 and later 101 and respiratory rate of 17 and later 32.  BMP showed calcium  of 7.6 and was otherwise unremarkable.  CBC showed leukocytosis of 12.1.  Platelets were 588.  Hemoglobin and hematocrit were 10.2 and 31.5 above previous levels.  EKG as reviewed by me : EKG showed normal sinus rhythm with a rate of 97 with premature supraventricular complexes and low voltage QRS with poor R wave progression. Imaging: Noncontrast head CT scan revealed generalized cerebral atrophy and microvascular disease changes of the supratentorial brain with no acute intracranial abnormality.  The patient was given 1 L bolus of IV normal saline, 5 mg of p.o. midodrine   and 1 g of IV Rocephin .  She will be admitted to a medical telemetry bed for further evaluation and management. PAST MEDICAL HISTORY:   Past Medical History:  Diagnosis Date   Allergic rhinitis    Arthritis    Asthma    problems with fumes and aerosols cause asthma   Chest pain 10/19/2016   CHF (congestive heart failure) (HCC)    Complication of anesthesia    awakens during surgery; has occurred with last 3-4 surgeries    COPD (chronic obstructive pulmonary disease) (HCC)    DVT (deep venous thrombosis) (HCC) 07/13/2018   Right leg   Environmental allergies    fumes    Fibromyalgia    GERD (gastroesophageal reflux disease)    Headache    History of bronchitis    Hx of total knee arthroplasty 12/13/2015   Hyperlipidemia    Hypertension    Morbid obesity with BMI of 45.0-49.9, adult (HCC) 07/25/2015   NICM (nonischemic cardiomyopathy) (HCC)    a. ? PVC mediated;  b. 06/2016 Echo: EF 25-30%, mild LVH, mild LAE, mild AI/MR/TR/PR; c. 08/2016 Cath: nl cors, EF 25%.   Numbness    hands bilat when driving; improves when not preforming task    Osteoarthritis of left knee 08/02/2015   PE (pulmonary thromboembolism) (HCC) 07/13/2018   Pes anserinus bursitis of left knee 05/18/2016   Presence of right artificial knee joint 05/18/2016   PVC (premature ventricular contraction) 06/04/2017  PVC's (premature ventricular contractions)    a. 11/2016 Amio started;  b. 11/29/16 48h Holter: 57574 PVC's (41%); c. 12/2016 24h Holter: 18040 PVC's (15%); c. 02/2017 48h Holter: 37262 PVC's (41%).   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   Temporal arteritis (HCC) 05/01/2020   Tinnitus    comes and goes    Unilateral primary osteoarthritis, right knee 12/13/2015   Vertigo    none for over 2 yrs    PAST SURGICAL HISTORY:   Past Surgical History:  Procedure Laterality  Date   ABDOMINAL HYSTERECTOMY  1979   left ovary remains   ABDOMINAL HYSTERECTOMY     ARTERY BIOPSY Left 06/07/2019   Procedure: BIOPSY TEMPORAL ARTERY;  Surgeon: Celso College, MD;  Location: [facility] ORS;  Service: Vascular;  Laterality: Left;   CHOLECYSTECTOMY     COLON SURGERY     COLONOSCOPY     COLONOSCOPY N/A 10/30/2019   Procedure: COLONOSCOPY;  Surgeon: Shane Darling, MD;  Location: [facility] ENDOSCOPY;  Service: Endoscopy;  Laterality: N/A;   ESOPHAGOGASTRODUODENOSCOPY N/A 10/30/2019   Procedure: ESOPHAGOGASTRODUODENOSCOPY (EGD);  Surgeon: Shane Darling, MD;  Location: [facility] END[facility];  Service: Endoscopy;  Laterality: N/A;   fibrous tissue removed from right shoulder and back of neck      2 years ago    FLOOR OF MOUTH BIOPSY N/A 02/28/2019   Procedure: SALIVARY GLAND BIOPSY;  Surgeon: Rogers Clayman, MD;  Location: [facility] ORS;  Service: ENT;  Laterality: N/A;   FRACTURE SURGERY     left small finger   GANGLION CYST EXCISION     GASTRIC BYPASS  1980   states had bypass with banding and band left in -   GASTRIC BYPASS OPEN     JOINT REPLACEMENT Bilateral 2017   knees   KNEE SURGERY Bilateral    both knees  2001   LAPAROSCOPIC SIGMOID COLECTOMY Left 09/13/2020   at [facility] for a perforated sigmoid diverticulitis   NECK SURGERY N/A 1205/19   ganglion cyst removal, [facility]    PARTIAL COLECTOMY  09/2020   PULMONARY THROMBECTOMY Bilateral 07/14/2018   Procedure: PULMONARY THROMBECTOMY;  Surgeon: Celso College, MD;  Location: [facility] INVASIVE CV LAB;  Service: Cardiovascular;  Laterality: Bilateral;   PVC ABLATION N/A 06/04/2017   Procedure: PVC ABLATION;  Surgeon: Tammie Fall, MD;  Location: [facility]VAS[facility] CV LAB;  Service: Cardiovascular;  Laterality: N/A;   RIGHT/LEFT HEART CATH AND CORONARY ANGIOGRAPHY Bilateral 08/12/2016   Procedure: Right/Left Heart Cath and Coronary Angiography;  Surgeon: Antonette Batters, MD;  Location: [facility] INVAS[facility] CV LAB;  Service: Cardiovascular;   Laterality: Bilateral;   RIGHT/LEFT HEART CATH AND CORONARY ANGIOGRAPHY N/A 03/16/2023   Procedure: RIGHT/LEFT HEART CATH AND CORONARY ANGIOGRAPHY;  Surgeon: Darlis Eisenmenger, MD;  Location: [facility] INV[facility] CV [facility];  Service: Cardiovascular;  Laterality: N/A;   TONSILLECTOMY     age 26   TOTAL KNEE ARTHROPLASTY Left 08/02/2015   Procedure: LEFT TOTAL KNEE ARTHROPLASTY;  Surgeon: Arnie Lao, MD;  Location: [facility] ORS;  Service: Orthopedics;  Laterality: Left;   TOTAL KNEE ARTHROPLASTY Right 12/13/2015   Procedure: RIGHT TOTAL KNEE ARTHROPLASTY;  Surgeon: Arnie Lao, MD;  Location: [facility] OR[facility];  Service: Orthopedics;  Laterality: Right;    SOCIAL HISTORY:   Social History   Tobacco Use   Smoking status: Former  Current packs/day: 0.00    Average packs/day: 0.3 packs/day for 50.0 years (12.5 ttl pk-yrs)    Types: Cigarettes    Start date: 08/07/1970    Quit date: 08/06/2020    Years since quitting: 2.9   Smokeless tobacco: Current    Types: Chew  Substance Use Topics   Alcohol  use: No    Alcohol /week: 0.0 standard drinks of alcohol     FAMILY HISTORY:   Family History  Problem Relation Age of Onset   Hypertension Father    Deep vein thrombosis Father    Dementia Mother    Diabetes Sister    Congestive Heart Failure Sister    Congestive Heart Failure Brother    Congestive Heart Failure Daughter    Congestive Heart Failure Son    Breast cancer Maternal Aunt        great aunt    DRUG ALLERGIES:   Allergies  Allergen Reactions   Hydrocodone  Swelling   Hydrocodone -Acetaminophen  Swelling    hands   Iodinated Contrast Media Anaphylaxis   Iodine Anaphylaxis and Swelling    Iv dye  02/03/21-Ispoke to patient and she had IV contrast  X4 last year and has had no problem. ( Last CT abdomen with contrast was on 01/02/21.-   Nitrofurantoin    Other Other (See Comments)    ALLERGY TO METAL - BLACKENS SKIN AND CAUSES A RASH   Tape Swelling and Other (See Comments)     Skin comes off.  Paper tape is ok tegaderm OK   Shellfish-Derived Products    Wound Dressing Adhesive    Peanut Oil Other (See Comments)    ** Nuts cause runny nose   Shellfish Allergy Nausea And Vomiting and Swelling    Episode of GI infection after eating clam chowder. Still eats shrimp and other seafood    REVIEW OF SYSTEMS:   ROS As per history of present illness. All pertinent systems were reviewed above. Constitutional, HEENT, cardiovascular, respiratory, GI, GU, musculoskeletal, neuro, psychiatric, endocrine, integumentary and hematologic systems were reviewed and are otherwise negative/unremarkable except for positive findings mentioned above in the HPI.   MEDICATIONS AT HOME:   Prior to Admission medications   Medication Sig Start Date End Date Taking? Authorizing Provider  albuterol  (VENTOLIN  HFA) 108 (90 Base) MCG/ACT inhaler Inhale 2 puffs into the lungs every 6 (six) hours as needed for wheezing.   Yes [provider]  apixaban  (ELIQUIS ) 5 MG TABS tablet Take 1 tablet (5 mg total) by mouth 2 (two) times daily. 07/22/18 07/01/23 Yes Pyreddy, Pavan, MD  ascorbic acid  (VITAMIN C ) 500 MG tablet Take 500 mg by mouth daily.   Yes [provider]  calcium  carbonate (OS-CAL - DOSED IN MG OF ELEMENTAL CALCIUM ) 1250 (500 Ca) MG tablet Take 2 tablets by mouth in the morning.   Yes [provider]  Carboxymethylcellulose Sod PF 1 % GEL Administer 2 drops to both eyes Three (3) times a day. 10/20/22  Yes [provider]  Cholecalciferol  (VITAMIN D -1000 MAX ST) 25 MCG (1000 UT) tablet Take 1,000 Units by mouth daily. 10/20/22 10/20/23 Yes [provider]  cyanocobalamin  (VITAMIN B12) 1000 MCG tablet Take 1 tablet by mouth daily. 10/20/22  Yes [provider]  diphenhydrAMINE  (BENADRYL ) 25 mg capsule Take 25 mg by mouth every 6 (six) hours as needed for itching or allergies.   Yes [provider]  docusate sodium  (COLACE) 100 MG  capsule Take 100 mg by mouth 2 (two) times daily as needed  for mild constipation.   Yes [provider]  ferrous sulfate  325 (65 FE) MG tablet Take 325 mg by mouth 2 (two) times daily with a meal.   Yes [provider]  fluconazole  (DIFLUCAN ) 100 MG tablet Take 100 mg by mouth daily.   Yes [provider]  fluticasone  (FLONASE ) 50 MCG/ACT nasal spray Place 1 spray into both nostrils daily.   Yes [provider]  folic acid  (FOLVITE ) 1 MG tablet Take 1 mg by mouth daily. 01/18/23  Yes [provider]  gabapentin  (NEURONTIN ) 300 MG capsule Take 1 capsule (300 mg total) by mouth 3 (three) times daily. 03/03/23 07/01/23 Yes Adams, James G, MD  levocetirizine (XYZAL ) 5 MG tablet Take 10 mg by mouth every evening.   Yes [provider]  melatonin 3 MG TABS tablet Take 3 mg by mouth at bedtime as needed. 10/20/22  Yes [provider]  midodrine  (PROAMATINE ) 10 MG tablet Take 5 mg by mouth 3 (three) times daily.   Yes [provider]  montelukast  (SINGULAIR ) 10 MG tablet Take 10 mg by mouth daily. 01/27/23  Yes [provider]  Multiple Vitamin (MULTIVITAMIN WITH MINERALS) TABS tablet Take 1 tablet by mouth daily. 03/27/23  Yes Swayze, Ava, DO  mupirocin ointment (BACTROBAN) 2 % APPLY OINTMENT TOPICALLY THREE TIMES DAILY AS NEEDED 01/18/23  Yes [provider]  nortriptyline  (PAMELOR ) 50 MG capsule Take 50 mg by mouth at bedtime.   Yes [provider]  oxyCODONE -acetaminophen  (PERCOCET) 10-325 MG tablet Take 1 tablet by mouth every 6 (six) hours as needed for pain. 06/03/23 07/03/23 Yes Zula Hitch, MD  Potassium Chloride  ER 20 MEQ TBCR Take by mouth.  Give 1 tablet by mouth two times a day for low potassium. Do not crush. Take with food and a full glass of water . 02/05/2023 - 01/29/22  Yes [provider]  thiamine  (VITAMIN B1) 100 MG tablet Take 1 tablet by mouth daily. 10/20/22  Yes [provider]   torsemide  (DEMADEX ) 10 MG tablet Take 10 mg by mouth daily.   Yes [provider]  vitamin A  3 MG (10000 UNITS) capsule Take 1 capsule (10,000 Units total) by mouth daily. For 60 days 02/05/23  Yes Amin, Sumayya, MD  acetaminophen  (TYLENOL ) 325 MG tablet Take 2 tablets (650 mg total) by mouth every 6 (six) hours as needed for mild pain or fever. 11/17/20   Cephas Collier, MD  alendronate (FOSAMAX) 35 MG tablet Take 1 tablet (35 mg total) by mouth every 7 (seven) days Take with a full glass of water . Do not lie down for the next 30 min. Patient not taking: Reported on 07/01/2023 07/09/22 07/09/23  [provider]  amiodarone  (PACERONE ) 200 MG tablet Take 1 tablet (200 mg total) by mouth daily. 03/27/23   Swayze, Ava, DO  copper  tablet Take 1 tablet (2 mg total) by mouth daily. Patient not taking: Reported on 03/08/2023 02/05/23   Amin, Sumayya, MD  estradiol (ESTRACE) 0.1 MG/GM vaginal cream Place vaginally.  2 gram Vaginal one time a day Routine Insert 2 gram vaginally one time a day for recurrent UTI for 2 Weeks AND Insert 2 gram vaginally one time a day every Mon, Wed, Fri for recurrent UTI 02/23/2023 - Patient not taking: Reported on 07/01/2023 02/23/23   [provider]  famotidine  (PEPCID ) 20 MG tablet Take 20 mg by mouth 2 (two) times daily. Patient not taking: Reported on 07/01/2023    [provider]  hydrocortisone  (CORTEF ) 5 MG tablet Take 3 tablets (15 mg total) by mouth every morning AND 1 tablet (5 mg total) every afternoon after lunch and before dinner Patient not taking: Reported on 07/01/2023 10/20/22   [provider]  ipratropium-albuterol  (DUONEB) 0.5-2.5 (3) MG/3ML SOLN Take 3 mLs by nebulization every 6 (six) hours as needed (shortness of breath). Patient not taking: Reported on 07/01/2023    [provider]  lactose free nutrition (BOOST PLUS) LIQD Take 237 mLs by mouth 3 (three) times daily between meals. 02/04/23   Amin, Sumayya, MD   metoprolol  succinate (TOPROL -XL) 25 MG 24 hr tablet Take 0.5 tablets (12.5 mg total) by mouth daily. Patient not taking: Reported on 07/01/2023 03/27/23   Swayze, Ava, DO  nystatin  (MYCOSTATIN ) 100000 UNIT/ML suspension Take 5 mLs by mouth 4 (four) times daily.    [provider]  nystatin  cream (MYCOSTATIN ) Apply topically 4 (four) times daily.    [provider]  omeprazole (PRILOSEC OTC) 20 MG tablet Take 20 mg by mouth daily. Patient not taking: Reported on 07/01/2023    [provider]  ondansetron  (ZOFRAN ) 4 MG tablet Take 1 tablet (4 mg total) by mouth every 6 (six) hours as needed for nausea. 02/05/21   Sheikh, Omair Latif, DO  pantoprazole  (PROTONIX ) 40 MG tablet Take 40 mg by mouth. take 1 tablet (40 mg total) by mouth daily before breakfast.    [provider]  polyethylene glycol (MIRALAX  / GLYCOLAX ) 17 g packet Take 17 g by mouth daily as needed for moderate constipation. 11/17/20   Cephas Collier, MD  potassium chloride  SA (KLOR-CON  M) 20 MEQ tablet Take 2 tablets (40 mEq total) by mouth once for 1 dose. 03/27/23 03/27/23  Swayze, Ava, DO  predniSONE  (DELTASONE ) 1 MG tablet Take 2 mg by mouth.  Take 2 tablets (2 mg total) by mouth once daily Patient not taking: Reported on 07/01/2023 07/09/22   [provider]  pyridostigmine  (MESTINON ) 60 MG tablet Take 0.5 tablets (30 mg total) by mouth 2 (two) times daily. Patient not taking: Reported on 07/01/2023 03/27/23   Swayze, Ava, DO  spironolactone  (ALDACTONE ) 25 MG tablet Take 0.5 tablets (12.5 mg total) by mouth daily. Patient not taking: Reported on 07/01/2023 03/27/23   Swayze, Ava, DO  triamcinolone cream (KENALOG) 0.1 % Apply topically daily as needed.    [provider]  zinc  sulfate, 50mg  elemental zinc , 220 (50 Zn) MG capsule Take 1 capsule (220 mg total) by mouth daily. For 14 days Patient not taking: Reported on 03/07/2023 02/05/23   Luna Salinas, MD      VITAL SIGNS:  Blood  pressure 105/68, pulse (!) 53, temperature 98.3 F (36.8 C), temperature source Oral, resp. rate (!) 22, height 5' 8 (1.727 m), weight 124.4 kg, SpO2 100%.  PHYSICAL EXAMINATION:  Physical Exam  GENERAL:  73 y.o.-year-old patient lying in the bed with no acute distress.  EYES: Pupils equal, round, reactive to light and accommodation. No scleral icterus. Extraocular muscles intact.  HEENT: Head atraumatic, normocephalic. Oropharynx and nasopharynx clear.  NECK:  Supple, no jugular venous distention. No thyroid  enlargement, no tenderness.  LUNGS: Normal breath sounds bilaterally, no wheezing, rales,rhonchi or crepitation. No use of accessory muscles of respiration.  CARDIOVASCULAR: Regular rate and rhythm, S1, S2 normal. No murmurs, rubs, or gallops.  ABDOMEN: Soft, nondistended, nontender. Bowel sounds present. No organomegaly or mass.  EXTREMITIES: No pedal edema, cyanosis, or clubbing.  NEUROLOGIC: Cranial nerves II through XII are intact. Muscle  strength 5/5 in all extremities. Sensation intact. Gait not checked.  PSYCHIATRIC: The patient is alert and oriented x 3.  Normal affect and good eye contact. SKIN: No obvious rash, lesion, or ulcer.   LABORATORY PANEL:   CBC Recent Labs  Lab 07/01/23 1542  WBC 12.1*  HGB 10.2*  HCT 31.5*  PLT 588*   ------------------------------------------------------------------------------------------------------------------  Chemistries  Recent Labs  Lab 07/01/23 1542 07/01/23 1936  NA 135  --   K 4.3  --   CL 101  --   CO2 24  --   GLUCOSE 111*  --   BUN 13  --   CREATININE 0.73 0.63  CALCIUM  7.6*  --    ------------------------------------------------------------------------------------------------------------------  Cardiac Enzymes No results for input(s): TROPONINI in the last 168 hours. ------------------------------------------------------------------------------------------------------------------  RADIOLOGY:  CT Head Wo  Contrast Result Date: 07/01/2023 CLINICAL DATA:  Possible UTI, presenting with hypotension. EXAM: CT HEAD WITHOUT CONTRAST TECHNIQUE: Contiguous axial images were obtained from the base of the skull through the vertex without intravenous contrast. RADIATION DOSE REDUCTION: This exam was performed according to the departmental dose-optimization program which includes automated exposure control, adjustment of the mA and/or kV according to patient size and/or use of iterative reconstruction technique. COMPARISON:  August 01, 2018 FINDINGS: Brain: There is generalized cerebral atrophy with widening of the extra-axial spaces and ventricular dilatation. There are areas of decreased attenuation within the Caruthers matter tracts of the supratentorial brain, consistent with microvascular disease changes. A partially empty sella is seen. Vascular: Marked severity bilateral cavernous carotid artery calcification is noted. Skull: Normal. Negative for fracture or focal lesion. Sinuses/Orbits: No acute finding. Other: None. IMPRESSION: 1. Generalized cerebral atrophy and microvascular disease changes of the supratentorial brain. 2. No acute intracranial abnormality. Electronically Signed   By: Virgle Grime M.D.   On: 07/01/2023 19:46      IMPRESSION AND PLAN:  Assessment and Plan: * Sepsis due to gram-negative UTI (HCC) - Sepsis manifested by leukocytosis, tachycardia and tachypnea. - Patient will be admitted to a medical telemetry bed. - Will continue antibiotic therapy with IV cefepime  given her chronic Foley catheter. - Will follow blood and urine cultures. - The patient will be hydrated with IV lactated ringer .  Syncope - We will monitor for arrhythmias. - Will check orthostatics q 12 hours. - Will obtain a bilateral carotid Doppler and 2D echo. - The patient will be hydrated with lactated ringer . - Neurochecks will be followed and she will be placed on seizures precautions. - Neurology consult will be  obtained. - I notified Dr. Doretta Gant about the patient. -Differential diagnoses would include neurally mediated syncope, cardiogenic, arrhythmias related,  orthostatic hypotension and less likely hypoglycemia.  Hypotension - This could be partly related to severe sepsis in addition to her hypotension. - This could be contributing to her syncope. - We will continue her midodrine .  Chronic obstructive pulmonary disease (COPD) (HCC) - Given her somnolence we are checking ABG level. - Will continue her as needed DuoNebs.  Peripheral neuropathy - Will continue Neurontin  while holding off for sedation.  GERD without esophagitis The patient will continue PPI therapy as well as H2 blocker therapy.  Paroxysmal atrial fibrillation (HCC) - Will check an EKG. - Will continue  Eliquis . - Amiodarone  will be held off given her bradycardia.  It has not been filled for a while.   DVT prophylaxis: Eliquis  Eliquis  Advanced Care Planning:  Code Status: full code. Family Communication:  The plan of care was discussed in details  with the patient (and family). I answered all questions. The patient agreed to proceed with the above mentioned plan. Further management will depend upon hospital course. Disposition Plan: Back to previous home environment Consults called: Neurology. All the records are reviewed and case discussed with ED provider.  Status is: Inpatient   At the time of the admission, it appears that the appropriate admission status for this patient is inpatient.  This is judged to be reasonable and necessary in order to provide the required intensity of service to ensure the patient's safety given the presenting symptoms, physical exam findings and initial radiographic and laboratory data in the context of comorbid conditions.  The patient requires inpatient status due to high intensity of service, high risk of further deterioration and high frequency of surveillance required.  I certify that at  the time of admission, it is my clinical judgment that the patient will require inpatient hospital care extending more than 2 midnights.                            Dispo: The patient is from: Home              Anticipated d/c is to: Home              Patient currently is not medically stable to d/c.              Difficult to place patient: No  Virgene Griffin M.D on 07/01/2023 at 9:13 PM  [facility]   From 7 PM-7 AM, contact night-coverage [email or web address]  CC: Primary care physician; Pcp, No

## 2023-07-01 NOTE — ED Notes (Signed)
Mansy MD at bedside  

## 2023-07-02 ENCOUNTER — Inpatient Hospital Stay
Admission: RE | Admit: 2023-07-02 | Discharge: 2023-07-02 | Disposition: A | Discharge status: Home / Self Care | Source: Ambulatory Visit | Attending: Family Medicine | Admitting: Family Medicine

## 2023-07-02 ENCOUNTER — Inpatient Hospital Stay: Admission: RE | Admit: 2023-07-02 | Source: Ambulatory Visit

## 2023-07-02 ENCOUNTER — Inpatient Hospital Stay: Admitting: Anesthesiology

## 2023-07-02 ENCOUNTER — Inpatient Hospital Stay

## 2023-07-02 DIAGNOSIS — A4150 Gram-negative sepsis, unspecified: Secondary | ICD-10-CM | POA: Diagnosis not present

## 2023-07-02 DIAGNOSIS — N390 Urinary tract infection, site not specified: Secondary | ICD-10-CM | POA: Diagnosis not present

## 2023-07-02 LAB — RESPIRATORY PANEL BY PCR

## 2023-07-02 LAB — CBC
HCT: 27.1 % — ABNORMAL LOW (ref 36.0–46.0)
Hemoglobin: 8.8 g/dL — ABNORMAL LOW (ref 12.0–15.0)
MCH: 26.7 pg (ref 26.0–34.0)
MCHC: 32.5 g/dL (ref 30.0–36.0)
MCV: 82.1 fL (ref 80.0–100.0)
Platelets: 542 10*3/uL — ABNORMAL HIGH (ref 150–400)
RBC: 3.3 MIL/uL — ABNORMAL LOW (ref 3.87–5.11)
RDW: 18.8 % — ABNORMAL HIGH (ref 11.5–15.5)
WBC: 7.8 10*3/uL (ref 4.0–10.5)
nRBC: 0.3 % — ABNORMAL HIGH (ref 0.0–0.2)

## 2023-07-02 LAB — ECHOCARDIOGRAM COMPLETE
AR max vel: 1.88 cm2
AV Area VTI: 2.29 cm2
AV Area mean vel: 1.82 cm2
AV Mean grad: 13.5 mmHg
AV Peak grad: 20.3 mmHg
Ao pk vel: 2.26 m/s
Area-P 1/2: 4.74 cm2
Height: 68 in
S' Lateral: 3.2 cm
Weight: 4388.8 [oz_av]

## 2023-07-02 LAB — BASIC METABOLIC PANEL WITH GFR
Anion gap: 4 — ABNORMAL LOW (ref 5–15)
BUN: 13 mg/dL (ref 8–23)
CO2: 26 mmol/L (ref 22–32)
Calcium: 6.9 mg/dL — ABNORMAL LOW (ref 8.9–10.3)
Chloride: 104 mmol/L (ref 98–111)
Creatinine, Ser: 0.66 mg/dL (ref 0.44–1.00)
GFR, Estimated: >60 mL/min (ref >60)
Glucose, Bld: 88 mg/dL (ref 70–99)
Potassium: 3.4 mmol/L — ABNORMAL LOW (ref 3.5–5.1)
Sodium: 134 mmol/L — ABNORMAL LOW (ref 135–145)

## 2023-07-02 LAB — CORTISOL-AM, BLOOD: Cortisol - AM: 2.5 ug/dL — ABNORMAL LOW (ref 6.7–22.6)

## 2023-07-02 LAB — PROTIME-INR
INR: 1.7 — ABNORMAL HIGH (ref 0.8–1.2)
Prothrombin Time: 20 s — ABNORMAL HIGH (ref 11.4–15.2)

## 2023-07-02 LAB — MAGNESIUM: Magnesium: 1.9 mg/dL (ref 1.7–2.4)

## 2023-07-02 MED ORDER — VANCOMYCIN HCL 1750 MG/350ML IV SOLN
1750.0000 mg | INTRAVENOUS | Status: DC
  Administered 2023-07-03 – 2023-07-05 (×3): 1750 mg via INTRAVENOUS
  Filled 2023-07-02 (×3): qty 350

## 2023-07-02 MED ORDER — ZINC OXIDE 40 % EX OINT: TOPICAL_OINTMENT | Freq: Two times a day (BID) | CUTANEOUS | Status: DC | PRN

## 2023-07-02 MED ORDER — LACTATED RINGERS IV BOLUS
250.0000 mL | Freq: Once | INTRAVENOUS | Status: AC
  Administered 2023-07-02: 250 mL via INTRAVENOUS

## 2023-07-02 MED ORDER — ZINC OXIDE 40 % EX OINT
TOPICAL_OINTMENT | Freq: Two times a day (BID) | CUTANEOUS | Status: DC
  Administered 2023-07-04: 1 via TOPICAL
  Filled 2023-07-02 (×2): qty 113

## 2023-07-02 MED ORDER — METOPROLOL SUCCINATE ER 25 MG PO TB24
12.5000 mg | ORAL_TABLET | Freq: Every day | ORAL | Status: DC
  Administered 2023-07-03 – 2023-07-12 (×8): 12.5 mg via ORAL
  Filled 2023-07-02: qty 1
  Filled 2023-07-02: qty 0.5
  Filled 2023-07-02: qty 1
  Filled 2023-07-02: qty 0.5
  Filled 2023-07-02: qty 1
  Filled 2023-07-02: qty 0.5
  Filled 2023-07-02 (×3): qty 1
  Filled 2023-07-02: qty 0.5

## 2023-07-02 MED ORDER — CALCIUM CARBONATE 1250 (500 CA) MG PO TABS
2.0000 | ORAL_TABLET | Freq: Every day | ORAL | Status: DC
  Administered 2023-07-02 – 2023-07-04 (×3): 2500 mg via ORAL
  Filled 2023-07-02 (×5): qty 2

## 2023-07-02 MED ORDER — VANCOMYCIN HCL 1500 MG/300ML IV SOLN
1500.0000 mg | Freq: Once | INTRAVENOUS | Status: AC
  Administered 2023-07-02: 1500 mg via INTRAVENOUS
  Filled 2023-07-02: qty 300

## 2023-07-02 MED ORDER — CHLORHEXIDINE GLUCONATE CLOTH 2 % EX PADS
6.0000 | MEDICATED_PAD | Freq: Every day | CUTANEOUS | Status: DC
  Administered 2023-07-02 – 2023-07-13 (×12): 6 via TOPICAL

## 2023-07-02 MED ORDER — MIDODRINE HCL 5 MG PO TABS: 10.0000 mg | ORAL_TABLET | Freq: Three times a day (TID) | ORAL | Status: DC

## 2023-07-02 MED ORDER — HYDROCORTISONE SOD SUC (PF) 100 MG IJ SOLR
100.0000 mg | Freq: Once | INTRAMUSCULAR | Status: AC
  Administered 2023-07-02: 100 mg via INTRAVENOUS
  Filled 2023-07-02: qty 2

## 2023-07-02 MED ORDER — SODIUM CHLORIDE 0.9 % IV SOLN
2.0000 g | Freq: Three times a day (TID) | INTRAVENOUS | Status: DC
  Administered 2023-07-02 – 2023-07-05 (×12): 2 g via INTRAVENOUS
  Filled 2023-07-02 (×14): qty 12.5

## 2023-07-02 MED ORDER — MIDODRINE HCL 5 MG PO TABS
10.0000 mg | ORAL_TABLET | Freq: Three times a day (TID) | ORAL | Status: DC
  Administered 2023-07-02: 10 mg via ORAL
  Filled 2023-07-02: qty 2

## 2023-07-02 MED ORDER — VANCOMYCIN HCL IN DEXTROSE 1-5 GM/200ML-% IV SOLN
1000.0000 mg | Freq: Once | INTRAVENOUS | Status: AC
  Administered 2023-07-02: 1000 mg via INTRAVENOUS
  Filled 2023-07-02: qty 200

## 2023-07-02 MED ORDER — SODIUM CHLORIDE 0.9 % IV BOLUS (SEPSIS)
250.0000 mL | Freq: Once | INTRAVENOUS | Status: AC
  Administered 2023-07-02: 250 mL via INTRAVENOUS

## 2023-07-02 MED ORDER — ZINC OXIDE 40 % EX OINT: TOPICAL_OINTMENT | CUTANEOUS | Status: DC | PRN

## 2023-07-02 NOTE — ED Notes (Signed)
 Mansy MD aware pt has gone into SVT rhythm, EKG captured. Lasting approx 30 seconds - 1 min.

## 2023-07-02 NOTE — ED Notes (Signed)
 Mansy MD made aware EKG in chart capturing afib RVR lasting approx 1 sec. Per pt, they state hx of afib. On Eliquis .

## 2023-07-02 NOTE — Progress Notes (Signed)
*  PRELIMINARY RESULTS* Echocardiogram 2D Echocardiogram has been performed.  Wendy Sanchez 07/02/2023, 2:46 PM

## 2023-07-02 NOTE — ED Notes (Signed)
Pt repositioned in bed. Pt tolerated well

## 2023-07-02 NOTE — ED Notes (Signed)
 Pt moved from ED 10 to ED8. Pt transferred onto hospital bed. Pt repositioned into more comfortable position. Pt tolerated well. Call bell within reach.

## 2023-07-02 NOTE — Consult Note (Signed)
 WOC Nurse Consult Note: patient known to WOC team from previous admission with Stage 3 Pressure Injury Sacrum  Reason for Consult: sacral wound  Wound type: Stage 3 Pressure Injury Sacrum  Pressure Injury POA: Yes Measurement: see nursing flowsheet  Wound bed: 50% red 50% tan fibrinous  Drainage (amount, consistency, odor) no foul smelling or purulent drainage per MD note  Periwound:  pink scattered full thickness areas  Dressing procedure/placement/frequency:  Cleanse sacral wound with Vashe wound cleanser Timm Foot [phone]), do not rinse and allow to air dry. Using a Q tip applicator insert Vashe moistened gauze into wound bed making sure to cover any depth, top with dry gauze and secure with ABD pad or silicone foam whichever is preferred.   Will write for a thin layer of Desitin to surrounding intact skin to buttocks.   Patient should be placed on a low air loss mattress for pressure redistribution and moisture management.   Patient has established colostomy.  Per last assessment in 03/2023 was using the following pouching system and was total assist with changing pouch.      Use Supplies: barrier ring, Timm Foot # [id number], wafer Timm Foot # 2, pouch Lawson # [id number]   POC discussed with bedside nurse. WOC team will not follow. Re-consult if further needs arise.   Thank you,    Ronni Colace MSN, RN-BC, [employer]

## 2023-07-02 NOTE — ED Notes (Signed)
 MD aware BP 85/59 (70)

## 2023-07-02 NOTE — Plan of Care (Signed)

## 2023-07-02 NOTE — Progress Notes (Signed)
 Eeg done

## 2023-07-02 NOTE — Progress Notes (Signed)
 Progress Note   Patient: Wendy Sanchez IRC:[id number] DOB: Nov 26, 1950 DOA: 07/01/2023     1 DOS: the patient was seen and examined on 07/02/2023   Brief hospital course: From HPI Wendy Sanchez is a 73 y.o. African-American female with medical history significant for asthma, osteoarthritis, giant cell arthritis, combined systolic and diastolic CHF, COPD, DVT, PE and atrial fibrillation on Eliquis , who presented to the emergency room with acute onset of increased weakness.  She was noted to have sediment in her Foley catheter that was changed yesterday however that continued today.  There was a concern about a sacral wound that felt to be tunneling deeper.  The patient had a brief syncopal episode in the ER when her family noted that she was mildly shaking and her eyes rolled back.  She did not appear to to be postictal when she was seen by the EDP.  No reported fever or chills.  No reported nausea or vomiting or abdominal pain.  No reported chest pain or palpitations.  No cough or wheezing or dyspnea.  No bleeding diathesis.  The patient has been getting twice a week dressing change for her sacral decubitus ulcer.   ED Course: When she came to the ER, BP was 89/70 and later on 108/63 with a heart rate of 52 and later 101 and respiratory rate of 17 and later 32.  BMP showed calcium  of 7.6 and was otherwise unremarkable.  CBC showed leukocytosis of 12.1.  Platelets were 588.  Hemoglobin and hematocrit were 10.2 and 31.5 above previous levels.      Assessment and Plan: Sepsis due to gram-negative UTI (HCC) - Sepsis manifested by leukocytosis, tachycardia and tachypnea. Patient received sepsis fluid in the emergency room Sepsis also compounded by decubitus sacral ulcer Continue cefepime  and vancomycin  Wound care on board we appreciate input Follow-up on culture results   Syncope likely secondary to hypotension Continue neurochecks closely I discussed with neurology and given that there was no  postictal state and syncope just lasted for few seconds in the setting of hypotension this is not in keeping with seizure We will consult neurology when needed Continue management for sepsis as above  Hypotension This is likely in the setting of severe sepsis Continue midodrine    Chronic obstructive pulmonary disease (COPD) (HCC) - Given her somnolence we are checking ABG level. Continue her as needed DuoNebs.   Peripheral neuropathy - Will continue Neurontin  while holding off for sedation.   GERD without esophagitis The patient will continue PPI therapy as well as H2 blocker therapy.   Paroxysmal atrial fibrillation (HCC) - Will continue  Eliquis . - Patient notes to have tachycardia today and therefore metoprolol  resumed     DVT prophylaxis: Eliquis   Advanced Care Planning:  Code Status: The patient is DNR only.  Family Communication:  The plan of care was discussed in details with the patient (and family).   Disposition Plan: Back to previous home environment  Consults called: Neurology.   Subjective:  Patient seen and examined at bedside this morning Admits to some improvement compared to upon arrival Denies nausea vomiting chest pain  Physical Exam:  GENERAL:  73 y.o.-year-old patient lying in the bed with no acute distress.  EYES: Pupils equal, round, reactive to light and accommodation. No scleral icterus. Extraocular muscles intact.  HEENT: Head atraumatic, normocephalic. Oropharynx and nasopharynx clear.  NECK:  Supple, no jugular venous distention. No thyroid  enlargement, no tenderness.  LUNGS: Normal breath sounds bilaterally, no wheezing, rales,rhonchi or crepitation.  No use of accessory muscles of respiration.  CARDIOVASCULAR: Regular rate and rhythm, S1, S2 normal. No murmurs, rubs, or gallops.  ABDOMEN: Soft, nondistended, nontender. Bowel sounds present. No organomegaly or mass.  EXTREMITIES: No pedal edema, cyanosis, or clubbing.  NEUROLOGIC: Cranial  nerves II through XII are intact. Muscle strength 5/5 in all extremities. Sensation intact. Gait not checked.  PSYCHIATRIC: The patient is alert and oriented x 3.  Normal affect and good eye contact. SKIN: Deep stage III-IV sacral decubitus ulcer with clean granulation tissue and [place] eschar.  No other obvious rash, lesion, or ulcer.   Vitals:   07/02/23 0800 07/02/23 0803 07/02/23 1125 07/02/23 1621  BP: 103/61  (!) 99/49 (!) 120/94  Pulse: (!) 57  (!) 110 60  Resp: (!) 22     Temp:  98.2 F (36.8 C) 98.9 F (37.2 C) 98.7 F (37.1 C)  TempSrc:  Oral Oral   SpO2: 98%  100% 100%  Weight:      Height:        Data Reviewed:  CT scan of the brain reviewed that did not show any acute intracranial pathology    Latest Ref Rng & Units 07/02/2023    4:42 AM 07/01/2023    3:42 PM 03/27/2023    5:51 AM  CBC  WBC 4.0 - 10.5 K/uL 7.8  12.1  11.2   Hemoglobin 12.0 - 15.0 g/dL 8.8  96.2  9.0   Hematocrit 36.0 - 46.0 % 27.1  31.5  28.0   Platelets 150 - 400 K/uL 542  588  595        Latest Ref Rng & Units 07/02/2023    2:38 AM 07/01/2023    7:36 PM 07/01/2023    3:42 PM  BMP  Glucose 70 - 99 mg/dL 88   952   BUN 8 - 23 mg/dL 13   13   Creatinine 8.41 - 1.00 mg/dL 3.24  4.01  0.27   Sodium 135 - 145 mmol/L 134   135   Potassium 3.5 - 5.1 mmol/L 3.4   4.3   Chloride 98 - 111 mmol/L 104   101   CO2 22 - 32 mmol/L 26   24   Calcium  8.9 - 10.3 mg/dL 6.9   7.6    Family Communication: None at bedside  Disposition: Status is: Inpatient  Time spent: 56 minutes  Author: Ezzard Holms, MD 07/02/2023 4:36 PM  For on call review [email or web address].

## 2023-07-02 NOTE — ED Notes (Addendum)
 Per Mansy MD, verbal order for NS bolus and Midodrine  10mg  PO now.

## 2023-07-02 NOTE — Progress Notes (Signed)
 Pharmacy Antibiotic Note  Wendy Sanchez is a 73 y.o. female admitted on 07/01/2023 with sepsis, UTI, and sacral decubitus ulcer.  Pharmacy has been consulted for Cefepime  , Vancomycin  dosing.  Plan: Cefepime  2 gm IV X 1 given in ED on 6/19 @ 2004. Cefepime  2 gm IV Q8H ordered to start on 6/20 @ 0400.  Vancomycin  2500 mg IV X 1 started in ED on 6/20 @ 0030. Vancomycin  1750 mg IV Q24H ordered to start on 6/21 @ 0030.  AUC = 488.3 Vanc trough = 10.6   Height: 5' 8 (172.7 cm) Weight: 124.4 kg (274 lb 4.8 oz) IBW/kg (Calculated) : 63.9  Temp (24hrs), Avg:98.4 F (36.9 C), Min:98.3 F (36.8 C), Max:98.6 F (37 C)  Recent Labs  Lab 07/01/23 1542 07/01/23 1645 07/01/23 1936  WBC 12.1*  --   --   CREATININE 0.73  --  0.63  LATICACIDVEN  --  1.7  --     Estimated Creatinine Clearance: 88.4 mL/min (by C-G formula based on SCr of 0.63 mg/dL).    Allergies  Allergen Reactions   Hydrocodone  Swelling   Hydrocodone -Acetaminophen  Swelling    hands   Iodinated Contrast Media Anaphylaxis   Iodine Anaphylaxis and Swelling    Iv dye  02/03/21-Ispoke to patient and she had IV contrast  X4 last year and has had no problem. ( Last CT abdomen with contrast was on 01/02/21.-   Nitrofurantoin    Other Other (See Comments)    ALLERGY TO METAL - BLACKENS SKIN AND CAUSES A RASH   Tape Swelling and Other (See Comments)    Skin comes off.  Paper tape is ok tegaderm OK   Shellfish-Derived Products    Wound Dressing Adhesive    Peanut Oil Other (See Comments)    ** Nuts cause runny nose   Shellfish Allergy Nausea And Vomiting and Swelling    Episode of GI infection after eating clam chowder. Still eats shrimp and other seafood    Antimicrobials this admission:   >>    >>   Dose adjustments this admission:   Microbiology results:  BCx:   UCx:    Sputum:    MRSA PCR:   Thank you for allowing pharmacy to be a part of this patient's care.  Pharoah Goggins D 07/02/2023 1:42 AM

## 2023-07-03 DIAGNOSIS — A4150 Gram-negative sepsis, unspecified: Secondary | ICD-10-CM | POA: Diagnosis not present

## 2023-07-03 DIAGNOSIS — N390 Urinary tract infection, site not specified: Secondary | ICD-10-CM | POA: Diagnosis not present

## 2023-07-03 LAB — CBC WITH DIFFERENTIAL/PLATELET
Abs Immature Granulocytes: 0.11 10*3/uL — ABNORMAL HIGH (ref 0.00–0.07)
Basophils Absolute: 0 10*3/uL (ref 0.0–0.1)
Basophils Relative: 0 %
Eosinophils Absolute: 0 10*3/uL (ref 0.0–0.5)
Eosinophils Relative: 0 %
HCT: 29 % — ABNORMAL LOW (ref 36.0–46.0)
Hemoglobin: 9.3 g/dL — ABNORMAL LOW (ref 12.0–15.0)
Immature Granulocytes: 1 %
Lymphocytes Relative: 5 %
Lymphs Abs: 0.5 10*3/uL — ABNORMAL LOW (ref 0.7–4.0)
MCH: 26.7 pg (ref 26.0–34.0)
MCHC: 32.1 g/dL (ref 30.0–36.0)
MCV: 83.3 fL (ref 80.0–100.0)
Monocytes Absolute: 0.1 10*3/uL (ref 0.1–1.0)
Monocytes Relative: 1 %
Neutro Abs: 10.5 10*3/uL — ABNORMAL HIGH (ref 1.7–7.7)
Neutrophils Relative %: 93 %
Platelets: 514 10*3/uL — ABNORMAL HIGH (ref 150–400)
RBC: 3.48 MIL/uL — ABNORMAL LOW (ref 3.87–5.11)
RDW: 18.4 % — ABNORMAL HIGH (ref 11.5–15.5)
WBC: 11.3 10*3/uL — ABNORMAL HIGH (ref 4.0–10.5)
nRBC: 0 % (ref 0.0–0.2)

## 2023-07-03 LAB — BASIC METABOLIC PANEL WITH GFR
Anion gap: 9 (ref 5–15)
BUN: 15 mg/dL (ref 8–23)
CO2: 23 mmol/L (ref 22–32)
Calcium: 7.3 mg/dL — ABNORMAL LOW (ref 8.9–10.3)
Chloride: 103 mmol/L (ref 98–111)
Creatinine, Ser: 0.81 mg/dL (ref 0.44–1.00)
GFR, Estimated: >60 mL/min (ref >60)
Glucose, Bld: 96 mg/dL (ref 70–99)
Potassium: 3.4 mmol/L — ABNORMAL LOW (ref 3.5–5.1)
Sodium: 135 mmol/L (ref 135–145)

## 2023-07-03 MED ORDER — POTASSIUM CHLORIDE CRYS ER 20 MEQ PO TBCR
40.0000 meq | EXTENDED_RELEASE_TABLET | ORAL | Status: AC
  Administered 2023-07-03 (×2): 40 meq via ORAL
  Filled 2023-07-03 (×2): qty 2

## 2023-07-03 NOTE — Plan of Care (Signed)

## 2023-07-03 NOTE — Care Management Important Message (Signed)
 Important Message  Patient Details  Name: Wendy Sanchez MRN: [id number] Date of Birth: 30-Aug-1950   Important Message Given:  Yes - Medicare IM     Rojelio [name] 07/03/2023, 1:03 PM

## 2023-07-03 NOTE — Consult Note (Signed)
 WOC Nurse wound follow up Wound type: Requested to reassess the wound care per family. Last consult made yesterday 06/20. Sacral wound  Wound type: Stage 3 Pressure Injury Sacrum  Pressure Injury POA: Yes Measurement: see nursing flowsheet  Wound bed: 50% red 50% tan fibrinous  no foul smelling or purulent drainage per MD note  Periwound:  pink scattered full thickness areas   Dressing procedure/placement/frequency: Recommended continue cleanse sacral wound with Vashe wound cleanser Soila [phone]). Apply Aquacel [id number] to the wound bed, inserting and filling the dept. Cover with foam dressing, change every 3 days, if the Aquacel is totally saturated (change to a jelly consistence) or PRN.  WOC team will not plan to follow further.  Please reconsult if further assistance is needed. Thank-you,  Lela Holm BSN, RN, [employer], WOC  (Pager: [phone])

## 2023-07-03 NOTE — Plan of Care (Signed)
  Problem: Fluid Volume: Goal: Hemodynamic stability will improve Outcome: Progressing   Problem: Clinical Measurements: Goal: Diagnostic test results will improve Outcome: Progressing Goal: Signs and symptoms of infection will decrease Outcome: Progressing   Problem: Respiratory: Goal: Ability to maintain adequate ventilation will improve Outcome: Progressing   Problem: Clinical Measurements: Goal: Cardiovascular complication will be avoided Outcome: Progressing   Problem: Activity: Goal: Risk for activity intolerance will decrease Outcome: Progressing   Problem: Nutrition: Goal: Adequate nutrition will be maintained Outcome: Progressing   Problem: Coping: Goal: Level of anxiety will decrease Outcome: Progressing   Problem: Pain Managment: Goal: General experience of comfort will improve and/or be controlled Outcome: Progressing   Problem: Safety: Goal: Ability to remain free from injury will improve Outcome: Progressing   Problem: Skin Integrity: Goal: Risk for impaired skin integrity will decrease Outcome: Progressing

## 2023-07-03 NOTE — Progress Notes (Signed)
 Progress Note   Patient: Wendy Sanchez:[id number] DOB: 1950-02-07 DOA: 07/01/2023     2 DOS: the patient was seen and examined on 07/03/2023    Brief hospital course: From HPI Wendy Sanchez is a 73 y.o. African-American female with medical history significant for asthma, osteoarthritis, giant cell arthritis, combined systolic and diastolic CHF, COPD, DVT, PE and atrial fibrillation on Eliquis , who presented to the emergency room with acute onset of increased weakness.  She was noted to have sediment in her Foley catheter that was changed yesterday however that continued today.  There was a concern about a sacral wound that felt to be tunneling deeper.  The patient had a brief syncopal episode in the ER when her family noted that she was mildly shaking and her eyes rolled back.  She did not appear to to be postictal when she was seen by the EDP.  No reported fever or chills.  No reported nausea or vomiting or abdominal pain.  No reported chest pain or palpitations.  No cough or wheezing or dyspnea.  No bleeding diathesis.  The patient has been getting twice a week dressing change for her sacral decubitus ulcer.   ED Course: When she came to the ER, BP was 89/70 and later on 108/63 with a heart rate of 52 and later 101 and respiratory rate of 17 and later 32.  BMP showed calcium  of 7.6 and was otherwise unremarkable.  CBC showed leukocytosis of 12.1.  Platelets were 588.  Hemoglobin and hematocrit were 10.2 and 31.5 above previous levels.       Assessment and Plan: Sepsis due to gram-negative UTI (HCC) - Sepsis manifested by leukocytosis, tachycardia and tachypnea. Patient received sepsis fluid in the emergency room Sepsis also compounded by decubitus sacral ulcer Continue cefepime  and vancomycin  Wound care on board we appreciate input Follow-up on culture results   Syncope likely secondary to hypotension Continue neurochecks closely I discussed with neurology and given that there was  no postictal state and syncope just lasted for few seconds in the setting of hypotension this is not in keeping with seizure We will consult neurology when needed Continue management for sepsis as above   Hypotension This is likely in the setting of severe sepsis Continue midodrine    Chronic obstructive pulmonary disease (COPD) (HCC) - Given her somnolence we are checking ABG level. Continue her as needed DuoNebs.   Peripheral neuropathy - Will continue Neurontin  while holding off for sedation.   GERD without esophagitis The patient will continue PPI therapy as well as H2 blocker therapy.   Paroxysmal atrial fibrillation (HCC) - Will continue  Eliquis . - Patient notes to have tachycardia today and therefore metoprolol  resumed     DVT prophylaxis: Eliquis    Advanced Care Planning:  Code Status: The patient is DNR only.   Family Communication:  The plan of care was discussed in details with the patient (and family).     Disposition Plan: Back to previous home environment   Consults called: Neurology.   Subjective:  Patient seen and examined at bedside this morning Patient feels slightly better today denies nausea vomiting or chest pain   Physical Exam:  GENERAL:  73 y.o.-year-old patient lying in the bed with no acute distress.  EYES: Pupils equal, round, reactive to light and accommodation. No scleral icterus. Extraocular muscles intact.  HEENT: Head atraumatic, normocephalic. Oropharynx and nasopharynx clear.  NECK:  Supple, no jugular venous distention. No thyroid  enlargement, no tenderness.  LUNGS: Normal breath  sounds bilaterally, no wheezing, rales,rhonchi or crepitation. No use of accessory muscles of respiration.  CARDIOVASCULAR: Regular rate and rhythm, S1, S2 normal. No murmurs, rubs, or gallops.  ABDOMEN: Soft, nondistended, nontender. Bowel sounds present. No organomegaly or mass.  EXTREMITIES: No pedal edema, cyanosis, or clubbing.  NEUROLOGIC: Cranial  nerves II through XII are intact. Muscle strength 5/5 in all extremities. Sensation intact. Gait not checked.  PSYCHIATRIC: The patient is alert and oriented x 3.  Normal affect and good eye contact. SKIN: Deep stage III-IV sacral decubitus ulcer with clean granulation tissue and [place] eschar.  No other obvious rash, lesion, or ulcer.    Data Reviewed:  CT scan of the brain reviewed that did not show any acute intracranial pathology    Latest Ref Rng & Units 07/03/2023    5:10 AM 07/02/2023    2:38 AM 07/01/2023    7:36 PM  BMP  Glucose 70 - 99 mg/dL 96  88    BUN 8 - 23 mg/dL 15  13    Creatinine 9.55 - 1.00 mg/dL 9.18  9.33  9.36   Sodium 135 - 145 mmol/L 135  134    Potassium 3.5 - 5.1 mmol/L 3.4  3.4    Chloride 98 - 111 mmol/L 103  104    CO2 22 - 32 mmol/L 23  26    Calcium  8.9 - 10.3 mg/dL 7.3  6.9      Vitals:   07/03/23 0758 07/03/23 0815 07/03/23 1213 07/03/23 1656  BP: 104/64 93/62 97/64  (!) 97/54  Pulse: 85 83 67 88  Resp:      Temp: 98 F (36.7 C) 98 F (36.7 C) 98.4 F (36.9 C) 97.9 F (36.6 C)  TempSrc:  Oral  Oral  SpO2: 100% 100% 100% 100%  Weight:      Height:          Latest Ref Rng & Units 07/03/2023    5:10 AM 07/02/2023    4:42 AM 07/01/2023    3:42 PM  CBC  WBC 4.0 - 10.5 K/uL 11.3  7.8  12.1   Hemoglobin 12.0 - 15.0 g/dL 9.3  8.8  89.7   Hematocrit 36.0 - 46.0 % 29.0  27.1  31.5   Platelets 150 - 400 K/uL 514  542  588      Author: Drue ONEIDA Potter, MD 07/03/2023 5:59 PM  For on call review [email or web address].

## 2023-07-03 NOTE — Significant Event (Signed)
       CROSS COVER NOTE  NAME: Wendy Sanchez MRN: [id number] DOB : 1950/06/08 ATTENDING PHYSICIAN: Dorinda Drue ONEIDA, MD    Date of Service   07/03/2023   HPI/Events of Note   Message received from RN Greff, diego 73yo female admitted for sepsis d/t UTI, on yellow MEWS Temp 100.4 tylenol  given temp is now 100.58F, tachycardic 100-120bpm. soft BP 80/50 on manual.   HPI Patient brought to hospital due to increased weakness. Had syncopal episode in ER. SIRS criteria met with sacral wound as source  History extensive and include acute on chronic hypotension secondary to adrenal insufficiency  Cortisol level low at 2.5 upon admission  Interventions   Assessment/Plan:    07/03/2023    1:18 AM 07/03/2023   12:23 AM 07/03/2023   12:04 AM  Vitals with BMI  Systolic 94 88 71  Diastolic 56 54 51  Pulse   51     500 cc NS bolus Hydrocortisone  100 mg x  dose - dayteam to follow up treatment preference        Erminio LITTIE Cone NP [facility] Cross Cover 7pm-7am - check amion for availability Pager [phone]

## 2023-07-04 ENCOUNTER — Other Ambulatory Visit: Payer: Self-pay

## 2023-07-04 DIAGNOSIS — A4150 Gram-negative sepsis, unspecified: Secondary | ICD-10-CM | POA: Diagnosis not present

## 2023-07-04 DIAGNOSIS — N390 Urinary tract infection, site not specified: Secondary | ICD-10-CM | POA: Diagnosis not present

## 2023-07-04 LAB — CBC WITH DIFFERENTIAL/PLATELET
Abs Immature Granulocytes: 0.08 10*3/uL — ABNORMAL HIGH (ref 0.00–0.07)
Basophils Absolute: 0 10*3/uL (ref 0.0–0.1)
Basophils Relative: 0 %
Eosinophils Absolute: 0.1 10*3/uL (ref 0.0–0.5)
Eosinophils Relative: 1 %
HCT: 25.6 % — ABNORMAL LOW (ref 36.0–46.0)
Hemoglobin: 8.4 g/dL — ABNORMAL LOW (ref 12.0–15.0)
Immature Granulocytes: 1 %
Lymphocytes Relative: 12 %
Lymphs Abs: 0.8 10*3/uL (ref 0.7–4.0)
MCH: 26.8 pg (ref 26.0–34.0)
MCHC: 32.8 g/dL (ref 30.0–36.0)
MCV: 81.8 fL (ref 80.0–100.0)
Monocytes Absolute: 0.5 10*3/uL (ref 0.1–1.0)
Monocytes Relative: 7 %
Neutro Abs: 5.4 10*3/uL (ref 1.7–7.7)
Neutrophils Relative %: 79 %
Platelets: 493 10*3/uL — ABNORMAL HIGH (ref 150–400)
RBC: 3.13 MIL/uL — ABNORMAL LOW (ref 3.87–5.11)
RDW: 18.6 % — ABNORMAL HIGH (ref 11.5–15.5)
WBC: 6.8 10*3/uL (ref 4.0–10.5)
nRBC: 0 % (ref 0.0–0.2)

## 2023-07-04 LAB — BASIC METABOLIC PANEL WITH GFR
Anion gap: 6 (ref 5–15)
BUN: 20 mg/dL (ref 8–23)
CO2: 24 mmol/L (ref 22–32)
Calcium: 7.3 mg/dL — ABNORMAL LOW (ref 8.9–10.3)
Chloride: 102 mmol/L (ref 98–111)
Creatinine, Ser: 0.91 mg/dL (ref 0.44–1.00)
GFR, Estimated: >60 mL/min (ref >60)
Glucose, Bld: 91 mg/dL (ref 70–99)
Potassium: 3.4 mmol/L — ABNORMAL LOW (ref 3.5–5.1)
Sodium: 132 mmol/L — ABNORMAL LOW (ref 135–145)

## 2023-07-04 MED ORDER — SODIUM CHLORIDE 0.9 % IV SOLN
12.5000 mg | Freq: Four times a day (QID) | INTRAVENOUS | Status: DC | PRN
  Filled 2023-07-04: qty 0.5

## 2023-07-04 MED ORDER — METOPROLOL TARTRATE 5 MG/5ML IV SOLN
5.0000 mg | Freq: Four times a day (QID) | INTRAVENOUS | Status: DC | PRN
  Administered 2023-07-05 – 2023-07-06 (×3): 2.5 mg via INTRAVENOUS
  Administered 2023-07-07 – 2023-07-13 (×4): 5 mg via INTRAVENOUS
  Filled 2023-07-04 (×10): qty 5

## 2023-07-04 MED ORDER — POTASSIUM CHLORIDE CRYS ER 20 MEQ PO TBCR
40.0000 meq | EXTENDED_RELEASE_TABLET | ORAL | Status: AC
  Administered 2023-07-04 (×2): 40 meq via ORAL
  Filled 2023-07-04 (×2): qty 2

## 2023-07-04 NOTE — Progress Notes (Signed)
 Progress Note   Patient: Wendy Sanchez FMW:[id number] DOB: [birth year] DOA: 07/01/2023     3 DOS: the patient was seen and examined on 07/04/2023      Brief hospital course: From HPI Wendy Sanchez is a 73 y.o. African-American female with medical history significant for asthma, osteoarthritis, giant cell arthritis, combined systolic and diastolic CHF, COPD, DVT, PE and atrial fibrillation on Eliquis , who presented to the emergency room with acute onset of increased weakness.  She was noted to have sediment in her Foley catheter that was changed yesterday however that continued today.  There was a concern about a sacral wound that felt to be tunneling deeper.  The patient had a brief syncopal episode in the ER when her family noted that she was mildly shaking and her eyes rolled back.  She did not appear to to be postictal when she was seen by the EDP.  No reported fever or chills.  No reported nausea or vomiting or abdominal pain.  No reported chest pain or palpitations.  No cough or wheezing or dyspnea.  No bleeding diathesis.  The patient has been getting twice a week dressing change for her sacral decubitus ulcer.   ED Course: When she came to the ER, BP was 89/70 and later on 108/63 with a heart rate of 52 and later 101 and respiratory rate of 17 and later 32.  BMP showed calcium  of 7.6 and was otherwise unremarkable.  CBC showed leukocytosis of 12.1.  Platelets were 588.  Hemoglobin and hematocrit were 10.2 and 31.5 above previous levels.       Assessment and Plan: Sepsis due to gram-negative UTI (HCC) - Sepsis manifested by leukocytosis, tachycardia and tachypnea. Patient received sepsis fluid in the emergency room Sepsis also compounded by decubitus sacral ulcer Continue cefepime  and vancomycin  Wound care on board we appreciate input Follow-up on culture results   Syncope likely secondary to hypotension Continue neurochecks closely I discussed with neurology and given that there  was no postictal state and syncope just lasted for few seconds in the setting of hypotension this is not in keeping with seizure We will consult neurology when needed Continue management for sepsis as above   Hypokalemia Continue repletion and monitoring  Hypotension This is likely in the setting of severe sepsis Continue midodrine    Chronic obstructive pulmonary disease (COPD) (HCC) - Given her somnolence we are checking ABG level. Continue her as needed DuoNebs.   Peripheral neuropathy Continue Neurontin  while holding off for sedation.   GERD without esophagitis The patient will continue PPI therapy as well as H2 blocker therapy.   Paroxysmal atrial fibrillation (HCC) Continue Eliquis . - Patient notes to have tachycardia today and therefore metoprolol  resumed     DVT prophylaxis: Eliquis    Advanced Care Planning:  Code Status: The patient is DNR only.   Family Communication:  The plan of care was discussed in details with the patient (and family).     Disposition Plan: Back to previous home environment   Consults called: Neurology.   Subjective:  Patient with nausea and vomiting today Denied worsening abdominal pain Denies chest pain fever cough  Physical Exam:  GENERAL:  73 y.o.-year-old patient lying in the bed with no acute distress.  EYES: Pupils equal, round, reactive to light and accommodation. No scleral icterus. Extraocular muscles intact.  HEENT: Head atraumatic, normocephalic. Oropharynx and nasopharynx clear.  NECK:  Supple, no jugular venous distention. No thyroid  enlargement, no tenderness.  LUNGS: Normal breath sounds bilaterally,  no wheezing, rales,rhonchi or crepitation. No use of accessory muscles of respiration.  CARDIOVASCULAR: Regular rate and rhythm, S1, S2 normal. No murmurs, rubs, or gallops.  ABDOMEN: Soft, nondistended, nontender. Bowel sounds present. No organomegaly or mass.  EXTREMITIES: No pedal edema, cyanosis, or clubbing.   NEUROLOGIC: Cranial nerves II through XII are intact. Muscle strength 5/5 in all extremities. Sensation intact. Gait not checked.  PSYCHIATRIC: The patient is alert and oriented x 3.  Normal affect and good eye contact. SKIN: Deep stage III-IV sacral decubitus ulcer with clean granulation tissue and [place] eschar.  No other obvious rash, lesion, or ulcer.    Data Reviewed:  CT scan of the brain reviewed that did not show any acute intracranial pathology   Vitals:   07/04/23 0734 07/04/23 1147 07/04/23 1223 07/04/23 1546  BP: (!) 110/59 (!) 88/74 91/72 98/60   Pulse: 63 91  85  Resp:      Temp: 98 F (36.7 C) 98.5 F (36.9 C)  98.1 F (36.7 C)  TempSrc:      SpO2: 98% 95%  94%  Weight:      Height:          Latest Ref Rng & Units 07/04/2023    4:32 AM 07/03/2023    5:10 AM 07/02/2023    4:42 AM  CBC  WBC 4.0 - 10.5 K/uL 6.8  11.3  7.8   Hemoglobin 12.0 - 15.0 g/dL 8.4  9.3  8.8   Hematocrit 36.0 - 46.0 % 25.6  29.0  27.1   Platelets 150 - 400 K/uL 493  514  542        Latest Ref Rng & Units 07/04/2023    4:32 AM 07/03/2023    5:10 AM 07/02/2023    2:38 AM  BMP  Glucose 70 - 99 mg/dL 91  96  88   BUN 8 - 23 mg/dL 20  15  13    Creatinine 0.44 - 1.00 mg/dL 9.08  9.18  9.33   Sodium 135 - 145 mmol/L 132  135  134   Potassium 3.5 - 5.1 mmol/L 3.4  3.4  3.4   Chloride 98 - 111 mmol/L 102  103  104   CO2 22 - 32 mmol/L 24  23  26    Calcium  8.9 - 10.3 mg/dL 7.3  7.3  6.9      Author: Drue ONEIDA Potter, MD 07/04/2023 3:56 PM  For on call review [email or web address].

## 2023-07-04 NOTE — Progress Notes (Signed)
 Pt alert and oriented x3. Pt went into SVT. BP 153/126. Pt denies CP. EKG completed. Dr. Dorinda notified and ordered IV metoprolol . Pt instructed to bear down and assisted by staff to blow through a straw. Pt converted back to NSR. Metoprolol  was not given. Pt blood pressure now 93/54. Will continue to monitor pt.

## 2023-07-04 NOTE — Plan of Care (Signed)
  Problem: Fluid Volume: Goal: Hemodynamic stability will improve Outcome: Progressing   Problem: Respiratory: Goal: Ability to maintain adequate ventilation will improve Outcome: Progressing   Problem: Clinical Measurements: Goal: Ability to maintain clinical measurements within normal limits will improve Outcome: Progressing   Problem: Nutrition: Goal: Adequate nutrition will be maintained Outcome: Progressing   Problem: Pain Managment: Goal: General experience of comfort will improve and/or be controlled Outcome: Progressing   Problem: Safety: Goal: Ability to remain free from injury will improve Outcome: Progressing   Problem: Skin Integrity: Goal: Risk for impaired skin integrity will decrease Outcome: Progressing

## 2023-07-04 NOTE — Plan of Care (Signed)

## 2023-07-05 ENCOUNTER — Telehealth: Payer: Self-pay | Admitting: Anesthesiology

## 2023-07-05 ENCOUNTER — Inpatient Hospital Stay

## 2023-07-05 ENCOUNTER — Other Ambulatory Visit: Payer: Self-pay

## 2023-07-05 DIAGNOSIS — R4182 Altered mental status, unspecified: Secondary | ICD-10-CM | POA: Diagnosis not present

## 2023-07-05 DIAGNOSIS — G039 Meningitis, unspecified: Secondary | ICD-10-CM | POA: Diagnosis not present

## 2023-07-05 DIAGNOSIS — A4150 Gram-negative sepsis, unspecified: Secondary | ICD-10-CM

## 2023-07-05 DIAGNOSIS — R410 Disorientation, unspecified: Secondary | ICD-10-CM | POA: Diagnosis not present

## 2023-07-05 DIAGNOSIS — T3691XA Poisoning by unspecified systemic antibiotic, accidental (unintentional), initial encounter: Secondary | ICD-10-CM

## 2023-07-05 DIAGNOSIS — R55 Syncope and collapse: Secondary | ICD-10-CM | POA: Diagnosis not present

## 2023-07-05 DIAGNOSIS — R569 Unspecified convulsions: Secondary | ICD-10-CM | POA: Diagnosis not present

## 2023-07-05 DIAGNOSIS — N390 Urinary tract infection, site not specified: Secondary | ICD-10-CM | POA: Diagnosis not present

## 2023-07-05 DIAGNOSIS — T426X1A Poisoning by other antiepileptic and sedative-hypnotic drugs, accidental (unintentional), initial encounter: Secondary | ICD-10-CM

## 2023-07-05 DIAGNOSIS — R253 Fasciculation: Secondary | ICD-10-CM

## 2023-07-05 DIAGNOSIS — G9340 Encephalopathy, unspecified: Secondary | ICD-10-CM | POA: Diagnosis not present

## 2023-07-05 LAB — AEROBIC/ANAEROBIC CULTURE W GRAM STAIN (SURGICAL/DEEP WOUND)

## 2023-07-05 LAB — CBC WITH DIFFERENTIAL/PLATELET
Abs Immature Granulocytes: 0.13 10*3/uL — ABNORMAL HIGH (ref 0.00–0.07)
Basophils Absolute: 0.1 10*3/uL (ref 0.0–0.1)
Basophils Relative: 0 %
Eosinophils Absolute: 0.4 10*3/uL (ref 0.0–0.5)
Eosinophils Relative: 2 %
HCT: 26 % — ABNORMAL LOW (ref 36.0–46.0)
Hemoglobin: 8.6 g/dL — ABNORMAL LOW (ref 12.0–15.0)
Immature Granulocytes: 1 %
Lymphocytes Relative: 7 %
Lymphs Abs: 1.1 10*3/uL (ref 0.7–4.0)
MCH: 26.7 pg (ref 26.0–34.0)
MCHC: 33.1 g/dL (ref 30.0–36.0)
MCV: 80.7 fL (ref 80.0–100.0)
Monocytes Absolute: 0.4 10*3/uL (ref 0.1–1.0)
Monocytes Relative: 2 %
Neutro Abs: 13.7 10*3/uL — ABNORMAL HIGH (ref 1.7–7.7)
Neutrophils Relative %: 88 %
Platelets: 503 10*3/uL — ABNORMAL HIGH (ref 150–400)
RBC: 3.22 MIL/uL — ABNORMAL LOW (ref 3.87–5.11)
RDW: 18.4 % — ABNORMAL HIGH (ref 11.5–15.5)
WBC: 15.7 10*3/uL — ABNORMAL HIGH (ref 4.0–10.5)
nRBC: 0 % (ref 0.0–0.2)

## 2023-07-05 LAB — BLOOD GAS, ARTERIAL
Acid-Base Excess: 2.9 mmol/L — ABNORMAL HIGH (ref 0.0–2.0)
Bicarbonate: 25.7 mmol/L (ref 20.0–28.0)
O2 Content: 2 L/min
O2 Saturation: 99.1 %
Patient temperature: 37
pCO2 arterial: 33 mmHg (ref 32–48)
pH, Arterial: 7.5 — ABNORMAL HIGH (ref 7.35–7.45)
pO2, Arterial: 92 mmHg (ref 83–108)

## 2023-07-05 LAB — URINE CULTURE: Culture: >=100000 — AB

## 2023-07-05 LAB — FOLATE: Folate: 19.6 ng/mL (ref >5.9)

## 2023-07-05 LAB — BASIC METABOLIC PANEL WITH GFR
Anion gap: 6 (ref 5–15)
BUN: 22 mg/dL (ref 8–23)
CO2: 25 mmol/L (ref 22–32)
Calcium: 7.4 mg/dL — ABNORMAL LOW (ref 8.9–10.3)
Chloride: 102 mmol/L (ref 98–111)
Creatinine, Ser: 0.88 mg/dL (ref 0.44–1.00)
GFR, Estimated: >60 mL/min (ref >60)
Glucose, Bld: 76 mg/dL (ref 70–99)
Potassium: 3.8 mmol/L (ref 3.5–5.1)
Sodium: 133 mmol/L — ABNORMAL LOW (ref 135–145)

## 2023-07-05 LAB — VITAMIN B12: Vitamin B-12: 1412 pg/mL — ABNORMAL HIGH (ref 180–914)

## 2023-07-05 LAB — GLUCOSE, CAPILLARY
Glucose-Capillary: 78 mg/dL (ref 70–99)
Glucose-Capillary: 76 mg/dL (ref 70–99)

## 2023-07-05 MED ORDER — ADULT MULTIVITAMIN W/MINERALS CH: 1.0000 | ORAL_TABLET | Freq: Two times a day (BID) | ORAL | Status: DC

## 2023-07-05 MED ORDER — ZINC SULFATE 220 (50 ZN) MG PO CAPS
220.0000 mg | ORAL_CAPSULE | Freq: Every day | ORAL | Status: DC
  Administered 2023-07-05: 220 mg via ORAL
  Filled 2023-07-05: qty 1

## 2023-07-05 MED ORDER — BOOST PLUS PO LIQD: 237.0000 mL | Freq: Three times a day (TID) | ORAL | Status: DC

## 2023-07-05 MED ORDER — SODIUM CHLORIDE 0.9 % IV BOLUS
1000.0000 mL | Freq: Once | INTRAVENOUS | Status: AC
  Administered 2023-07-05: 1000 mL via INTRAVENOUS

## 2023-07-05 MED ORDER — NYSTATIN 100000 UNIT/GM EX CREA
TOPICAL_CREAM | Freq: Two times a day (BID) | CUTANEOUS | Status: DC
  Administered 2023-07-09: 1 via TOPICAL
  Filled 2023-07-05 (×2): qty 30

## 2023-07-05 MED ORDER — LACTATED RINGERS IV SOLN
150.0000 mL/h | INTRAVENOUS | Status: DC
Start: 2023-07-06 — End: 2023-07-06
  Administered 2023-07-06: 150 mL/h via INTRAVENOUS

## 2023-07-05 MED ORDER — DEXTROSE 5 % IV SOLN
10.0000 mg/kg | Freq: Three times a day (TID) | INTRAVENOUS | Status: DC
  Administered 2023-07-05 – 2023-07-09 (×11): 880 mg via INTRAVENOUS
  Filled 2023-07-05 (×13): qty 17.6

## 2023-07-05 MED ORDER — SODIUM CHLORIDE 0.9 % IV SOLN
2.0000 g | Freq: Two times a day (BID) | INTRAVENOUS | Status: DC
  Administered 2023-07-05 – 2023-07-06 (×2): 2 g via INTRAVENOUS
  Filled 2023-07-05 (×2): qty 20

## 2023-07-05 MED ORDER — VANCOMYCIN HCL 1750 MG/350ML IV SOLN
1750.0000 mg | INTRAVENOUS | Status: DC
  Filled 2023-07-05: qty 350

## 2023-07-05 MED ORDER — LEVETIRACETAM (KEPPRA) 500 MG/5 ML ADULT IV PUSH
1000.0000 mg | Freq: Two times a day (BID) | INTRAVENOUS | Status: DC
  Administered 2023-07-05 – 2023-07-12 (×15): 1000 mg via INTRAVENOUS
  Filled 2023-07-05 (×18): qty 10

## 2023-07-05 MED ORDER — SODIUM CHLORIDE 0.9 % IV SOLN: INTRAVENOUS | Status: DC

## 2023-07-05 MED ORDER — VITAMIN C 500 MG PO TABS: 500.0000 mg | ORAL_TABLET | Freq: Two times a day (BID) | ORAL | Status: DC

## 2023-07-05 MED ORDER — VANCOMYCIN HCL IN DEXTROSE 1-5 GM/200ML-% IV SOLN
1000.0000 mg | Freq: Two times a day (BID) | INTRAVENOUS | Status: DC
  Administered 2023-07-06 – 2023-07-07 (×4): 1000 mg via INTRAVENOUS
  Filled 2023-07-05 (×6): qty 200

## 2023-07-05 MED ORDER — GABAPENTIN 100 MG PO CAPS: 100.0000 mg | ORAL_CAPSULE | Freq: Three times a day (TID) | ORAL | Status: DC

## 2023-07-05 MED ORDER — SODIUM CHLORIDE 0.9 % IV BOLUS (SEPSIS)
1000.0000 mL | Freq: Once | INTRAVENOUS | Status: AC
  Administered 2023-07-06: 1000 mL via INTRAVENOUS

## 2023-07-05 MED ORDER — SODIUM CHLORIDE 0.9 % IV SOLN
2.0000 g | INTRAVENOUS | Status: DC
  Administered 2023-07-05 – 2023-07-06 (×4): 2 g via INTRAVENOUS
  Filled 2023-07-05 (×6): qty 2000

## 2023-07-05 NOTE — Progress Notes (Signed)
 Eeg done

## 2023-07-05 NOTE — Progress Notes (Signed)
 Initial Nutrition Assessment  DOCUMENTATION CODES:   Morbid obesity  INTERVENTION:   -Liberalize diet to 2 gram sodium for wider variety of meal selections -500 mg vitamin C  BID -Continue 2500 mg calcium  carbonate daily per MD -Continue 10,000 units vitamin A  daily per MD -500 mg vitamin C  BID -220 mg zinc  sulfate daily x 14 days -Due to chronic wounds, RD will check copper  level for signs of deficiency, secondary to zinc   -Boost Plus TID, each supplement provides 360 kcals and 14 grams protein  NUTRITION DIAGNOSIS:   Increased nutrient needs related to wound healing as evidenced by estimated needs.  GOAL:   Patient will meet greater than or equal to 90% of their needs  MONITOR:   PO intake, Supplement acceptance  REASON FOR ASSESSMENT:   Malnutrition Screening Tool    ASSESSMENT:   Pt with medical history significant for asthma, osteoarthritis, giant cell arthritis, combined systolic and diastolic CHF, COPD, DVT, PE and atrial fibrillation on Eliquis , who presented with acute onset of increased weakness.  Pt admitted with sepsis due to gram negative UTI.   6/23- s/p EEG- revealed moderate diffuse encephalopathy typically related to toxic-metabolic causes like cefepime  toxicity.   Reviewed I/O's: -3.9 L x 24 hours and -6.8 L since admission  UOP: 4.3 L x 24 hours  NGT output: 100 ml x 24 hours  Per [facility] notes, pt with stage 3 pressure injury to sacrum.   Spoke with pt and sister at bedside. Pt unable to provide history secondary to pain and deferred interview to sister. Pt sister concerned about mental status, stating this is not her. Pt familiar to this RD due to multiple prior admissions; RD confirmed that at baseline, but is usually very talkative and knowledgeable about her medical care. Per sister, pt has been altered since 07/02/23, when she was brought to the hospital. She noticed a general decline in health since 06/28/23, when she noticed her sacral wound  getting worse and called her PCP and home health company for further guidance. Prior to this incident, pt sister had been doing daily dressing changes and noticed improvement in wounds; she showed RD pictures from March to May, which wound was improved.   Pt with poor oral intake due to mental status, but sister has ordered lunch and will try to encourage and feed her. Pt does drink Boost supplements; she cannot drink Ensure as it runs right through her.   Reviewed wt hx; pt has experienced a 8% wt loss over the past year, which is not significant for time frame. Noted pt with moderate edema on exam, which may be masking true weight loss as well as fat and muscle depletions. Pt has history of malnutrition, however, unable to identify at this time.   Discussed importance of good meal and supplement intake to promote healing. Amenable to supplements.   Medications reviewed and include vitamin D3, vitamin B-12, ferrous sulfate , neurontin , keppra, protamine, protonix , thiamine , and vitamin A .   Labs reviewed: Na: 133, CBGS: 79.   NUTRITION - FOCUSED PHYSICAL EXAM:  Flowsheet Row Most Recent Value  Orbital Region No depletion  Upper Arm Region Mild depletion  Thoracic and Lumbar Region No depletion  Buccal Region No depletion  Temple Region Moderate depletion  Clavicle Bone Region No depletion  Clavicle and Acromion Bone Region No depletion  Scapular Bone Region No depletion  Dorsal Hand Moderate depletion  Patellar Region No depletion  Anterior Thigh Region No depletion  Posterior Calf Region No depletion  Edema (RD Assessment) Moderate  Hair Reviewed  Eyes Reviewed  Mouth Reviewed  Skin Reviewed  Nails Reviewed    Diet Order:   Diet Order             Diet 2 gram sodium Fluid consistency: Thin  Diet effective now                   EDUCATION NEEDS:   Education needs have been addressed  Skin:  Skin Assessment: Skin Integrity Issues: Skin Integrity Issues:: Stage  III Stage III: sacrum  Last BM:  07/04/23 (via colostomy)  Height:   Ht Readings from Last 1 Encounters:  07/01/23 5' 8 (1.727 m)    Weight:   Wt Readings from Last 1 Encounters:  07/01/23 124.4 kg    Ideal Body Weight:  63.6 kg  BMI:  Body mass index is 41.71 kg/m.  Estimated Nutritional Needs:   Kcal:  1900-2100  Protein:  95-110 grams  Fluid:  1.9-2.1 L    Margery ORN, RD, LDN, CDCES Registered Dietitian III Certified Diabetes Care and Education Specialist If unable to reach this RD, please use RD Inpatient group chat on secure chat between hours of 8am-4 pm daily

## 2023-07-05 NOTE — Consult Note (Signed)
 NEUROLOGY CONSULT NOTE   Date of service: July 05, 2023 Patient Name: Wendy Sanchez MRN:  [id number] DOB:  04/19/1950 Reason for Consultation: AMS Requesting Provider: Dorinda Drue ONEIDA, MD  History of Present Illness  Wendy Sanchez is a 73 y.o. female with a PMHx of allergic rhinitis, arthritis, asthma, chest pain, CHF, atrial fibrillation (on Eliquis ), COPD, DVT of right leg (2020), fibromyalgia, GERD, headache, HLD, HTN, morbid obesity, nonischemic cardiomyopathy with EF of 25-30% in 2018, osteoarthritis, PE, bilateral knee replacements, PVC, sleep apnea, lumbar spinal stenosis, s/p gastric banding, temporal arteritis (2022), tinnitus, and vertigo who presented to the hospital on Thursday afternoon (6/19) with UTI symptoms. She was hypotensive in Triage but alert and oriented x 4. A sacral pressure injury was also reported by the patient. She endorsed feeling weak for quite some time, but stated that her weakness had been getting worse recently. She reported cloudy urine. She was started on ABX and administered IVF in the ED. She also received a dose of her midodrine  for the hypotension.   While in the ED, the patient had what appeared per EDP documentation to either have been a syncopal or seizure-like episode. This was not witnessed by staff but family stated that they noticed her eyes rolled back and that she started to have some shaking. By the time staff got there she had no further shaking, did become responsive quickly and did not appear to be postictal exam by EDP. CT head in the ED was negative for acute abnormality.   She was admitted to the Hospitalist service with a diagnosis of sepsis due to gram-negative UTI (met sepsis criteria of leukocytosis, tachycardia and tachypnea). Wound care consult was obtained for a deep sacral decubitus ulcer. She was continued on her Eliquis  for her PAF. Vancomycin  was discontinued today due to MRSA screen being negative.   At her baseline, she is  bedbound with chronic ankle contractures (plantar flexed) and has a colostomy as well as a foley catheter at home.   Yesterday evening, the patient was alert and oriented x 3. Today, she was noted to have some tremors involving the right face and legs as well as bilateral upper extremities. EEG was obtained that did not show any epileptiform waves, but was consistent with an encephalopathy. The patient was initiated on Keppra today and Neurology was consulted.    ROS  Unable to ascertain due to severe encephalopathy.   Past History   Past Medical History:  Diagnosis Date   Allergic rhinitis    Arthritis    Asthma    problems with fumes and aerosols cause asthma   Chest pain 10/19/2016   CHF (congestive heart failure) (HCC)    Complication of anesthesia    awakens during surgery; has occurred with last 3-4 surgeries    COPD (chronic obstructive pulmonary disease) (HCC)    DVT (deep venous thrombosis) (HCC) 07/13/2018   Right leg   Environmental allergies    fumes    Fibromyalgia    GERD (gastroesophageal reflux disease)    Headache    History of bronchitis    Hx of total knee arthroplasty 12/13/2015   Hyperlipidemia    Hypertension    Morbid obesity with BMI of 45.0-49.9, adult (HCC) 07/25/2015   NICM (nonischemic cardiomyopathy) (HCC)    a. ? PVC mediated;  b. 06/2016 Echo: EF 25-30%, mild LVH, mild LAE, mild AI/MR/TR/PR; c. 08/2016 Cath: nl cors, EF 25%.   Numbness    hands bilat when driving;  improves when not preforming task    Osteoarthritis of left knee 08/02/2015   PE (pulmonary thromboembolism) (HCC) 07/13/2018   Pes anserinus bursitis of left knee 05/18/2016   Presence of right artificial knee joint 05/18/2016   PVC (premature ventricular contraction) 06/04/2017   PVC's (premature ventricular contractions)    a. 11/2016 Amio started;  b. 11/29/16 48h Holter: 57574 PVC's (41%); c. 12/2016 24h Holter: 18040 PVC's (15%); c. 02/2017 48h Holter: 37262 PVC's (41%).   Sleep  apnea    cannot tolerate CPAP   Spinal stenosis of lumbar region 06/19/2015   Status post gastric banding    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   Temporal arteritis (HCC) 05/01/2020   Tinnitus    comes and goes    Unilateral primary osteoarthritis, right knee 12/13/2015   Vertigo    none for over 2 yrs    Past Surgical History:  Procedure Laterality Date   ABDOMINAL HYSTERECTOMY  1979   left ovary remains   ABDOMINAL HYSTERECTOMY     ARTERY BIOPSY Left 06/07/2019   Procedure: BIOPSY TEMPORAL ARTERY;  Surgeon: Marea Selinda RAMAN, MD;  Location: [facility] ORS;  Service: Vascular;  Laterality: Left;   CHOLECYSTECTOMY     COLON SURGERY     COLONOSCOPY     COLONOSCOPY N/A 10/30/2019   Procedure: COLONOSCOPY;  Surgeon: Maryruth Ole DASEN, MD;  Location: [facility] ENDOS[facility];  Service: Endoscopy;  Laterality: N/A;   ESOPHAGOGASTRODUODENOSCOPY N/A 10/30/2019   Procedure: ESOPHAGOGASTRODUODENOSCOPY (EGD);  Surgeon: Maryruth Ole DASEN, MD;  Location: [facility] ENDOSCOPY;  Service: Endoscopy;  Laterality: N/A;   fibrous tissue removed from right shoulder and back of neck      2 years ago    FLOOR OF MOUTH BIOPSY N/A 02/28/2019   Procedure: SALIVARY GLAND BIOPSY;  Surgeon: Milissa Hamming, MD;  Location: [facility] ORS;  Service: ENT;  Laterality: N/A;   FRACTURE SURGERY     left small finger   GANGLION CYST EXCISION     GASTRIC BYPASS  1980   states had bypass with banding and band left in -   GASTRIC BYPASS OPEN     JOINT REPLACEMENT Bilateral 2017   knees   KNEE SURGERY Bilateral    both knees  2001   LAPAROSCOPIC SIGMOID COLECTOMY Left 09/13/2020   at [facility] for a perforated sigmoid diverticulitis   NECK SURGERY N/A 1205/19   ganglion cyst removal, benigh    PARTIAL COLECTOMY  09/2020   PULMONARY THROMBECTOMY Bilateral 07/14/2018   Procedure: PULMONARY THROMBECTOMY;  Surgeon: Marea Selinda RAMAN, MD;  Location: [facility] INVASIVE CV LAB;  Service: Cardiovascular;  Laterality: Bilateral;   PVC  ABLATION N/A 06/04/2017   Procedure: PVC ABLATION;  Surgeon: Waddell Danelle ORN, MD;  Location: [facility] CV [facility];  Service: Cardiovascular;  Laterality: N/A;   RIGHT/LEFT HEART CATH AND CORONARY ANGIOGRAPHY Bilateral 08/12/2016   Procedure: Right/Left Heart Cath and Coronary Angiography;  Surgeon: Florencio Cara BIRCH, MD;  Location: [facility] IN[facility] CV [facility];  Service: Cardiovascular;  Laterality: Bilateral;   RIGHT/LEFT HEART CATH AND CORONARY ANGIOGRAPHY N/A 03/16/2023   Procedure: RIGHT/LEFT HEART CATH AND CORONARY ANGIOGRAPHY;  Surgeon: Rolan Ezra RAMAN, MD;  Location: [facility] INV[facility] CV [facility];  Service: Cardiovascular;  Laterality: N/A;   TONSILLECTOMY     age 24   TOTAL KNEE ARTHROPLASTY Left 08/02/2015   Procedure: LEFT TOTAL KNEE ARTHROPLASTY;  Surgeon: Lonni CINDERELLA Poli, MD;  Location: [facility];  Service: Orthopedics;  Laterality: Left;   TOTAL KNEE ARTHROPLASTY Right 12/13/2015  Procedure: RIGHT TOTAL KNEE ARTHROPLASTY;  Surgeon: Lonni CINDERELLA Poli, MD;  Location: [facility];  Service: Orthopedics;  Laterality: Right;    Family History: Family History  Problem Relation Age of Onset   Hypertension Father    Deep vein thrombosis Father    Dementia Mother    Diabetes Sister    Congestive Heart Failure Sister    Congestive Heart Failure Brother    Congestive Heart Failure Daughter    Congestive Heart Failure Son    Breast cancer Maternal Aunt        great aunt    Social History  reports that she quit smoking about 2 years ago. Her smoking use included cigarettes. She started smoking about 52 years ago. She has a 12.5 pack-year smoking history. Her smokeless tobacco use includes chew. She reports that she does not drink alcohol  and does not use drugs.  Allergies  Allergen Reactions   Hydrocodone  Swelling   Hydrocodone -Acetaminophen  Swelling    hands   Iodinated Contrast Media Anaphylaxis   Iodine Anaphylaxis and Swelling    Iv dye  02/03/21-Ispoke to patient and she had IV  contrast  X4 last year and has had no problem. ( Last CT abdomen with contrast was on 01/02/21.-   Nitrofurantoin    Other Other (See Comments)    ALLERGY TO METAL - BLACKENS SKIN AND CAUSES A RASH   Tape Swelling and Other (See Comments)    Skin comes off.  Paper tape is ok tegaderm OK   Shellfish-Derived Products    Wound Dressing Adhesive    Peanut Oil Other (See Comments)    ** Nuts cause runny nose   Shellfish Allergy Nausea And Vomiting and Swelling    Episode of GI infection after eating clam chowder. Still eats shrimp and other seafood    Medications   Current Facility-Administered Medications:    acetaminophen  (TYLENOL ) tablet 650 mg, 650 mg, Oral, Q6H PRN, 650 mg at 07/05/23 0136 **OR** acetaminophen  (TYLENOL ) suppository 650 mg, 650 mg, Rectal, Q6H PRN, Mansy, Jan A, MD   albuterol  (PROVENTIL ) (2.5 MG/3ML) 0.083% nebulizer solution 2.5 mg, 2.5 mg, Nebulization, Q6H PRN, Mansy, Jan A, MD   apixaban  (ELIQUIS ) tablet 5 mg, 5 mg, Oral, BID, Mansy, Jan A, MD, 5 mg at 07/05/23 9080   ascorbic acid  (VITAMIN C ) tablet 500 mg, 500 mg, Oral, BID, Djan, Prince T, MD   calcium  carbonate (OS-CAL - dosed in mg of elemental calcium ) tablet 2,500 mg, 2 tablet, Oral, Q breakfast, Mansy, Jan A, MD, 2,500 mg at 07/04/23 9041   ceFEPIme  (MAXIPIME ) 2 g in sodium chloride  0.9 % 100 mL IVPB, 2 g, Intravenous, Q8H, Mansy, Jan A, MD, Last Rate: 200 mL/hr at 07/05/23 1412, 2 g at 07/05/23 1412   cetirizine  (ZYRTEC ) tablet 10 mg, 10 mg, Oral, QPM, Mansy, Jan A, MD, 10 mg at 07/04/23 1740   Chlorhexidine  Gluconate Cloth 2 % PADS 6 each, 6 each, Topical, Daily, Djan, Drue DASEN, MD, 6 each at 07/05/23 1016   cholecalciferol  (VITAMIN D3) 25 MCG (1000 UNIT) tablet 1,000 Units, 1,000 Units, Oral, Daily, Mansy, Jan A, MD, 1,000 Units at 07/04/23 0957   cyanocobalamin  (VITAMIN B12) tablet 1,000 mcg, 1,000 mcg, Oral, Daily, Mansy, Jan A, MD, 1,000 mcg at 07/04/23 9043   diphenhydrAMINE  (BENADRYL ) capsule 25 mg, 25  mg, Oral, Q6H PRN, Mansy, Jan A, MD   ferrous sulfate  tablet 325 mg, 325 mg, Oral, BID WC, Mansy, Jan A, MD, 325 mg at 07/04/23 1740  fluconazole  (DIFLUCAN ) tablet 100 mg, 100 mg, Oral, Daily, Mansy, Jan A, MD, 100 mg at 07/05/23 9076   folic acid  (FOLVITE ) tablet 1 mg, 1 mg, Oral, Daily, Mansy, Jan A, MD, 1 mg at 07/04/23 9042   gabapentin  (NEURONTIN ) capsule 300 mg, 300 mg, Oral, TID, Mansy, Jan A, MD, 300 mg at 07/05/23 0919   lactose free nutrition (BOOST PLUS) liquid 237 mL, 237 mL, Oral, TID WC, Djan, Drue DASEN, MD   levETIRAcetam (KEPPRA) undiluted injection 1,000 mg, 1,000 mg, Intravenous, Q12H, Djan, Prince T, MD, 1,000 mg at 07/05/23 9141   liver oil-zinc  oxide (DESITIN) 40 % ointment, , Topical, BID, Mansy, Jan A, MD, Given at 07/05/23 1016   liver oil-zinc  oxide (DESITIN) 40 % ointment, , Topical, PRN, Mansy, Jan A, MD   magnesium  hydroxide (MILK OF MAGNESIA) suspension 30 mL, 30 mL, Oral, Daily PRN, Mansy, Jan A, MD   melatonin tablet 5 mg, 5 mg, Oral, QHS PRN, Mansy, Jan A, MD   metoprolol  succinate (TOPROL -XL) 24 hr tablet 12.5 mg, 12.5 mg, Oral, Daily, Djan, Prince T, MD, 12.5 mg at 07/05/23 1045   metoprolol  tartrate (LOPRESSOR ) injection 5 mg, 5 mg, Intravenous, Q6H PRN, Dorinda Drue DASEN, MD   midodrine  (PROAMATINE ) tablet 5 mg, 5 mg, Oral, TID AC, Mansy, Jan A, MD, 5 mg at 07/05/23 1409   montelukast  (SINGULAIR ) tablet 10 mg, 10 mg, Oral, QPM, Mansy, Jan A, MD, 10 mg at 07/04/23 1740   multivitamin with minerals tablet 1 tablet, 1 tablet, Oral, BID, Djan, Drue DASEN, MD   nystatin  cream (MYCOSTATIN ), , Topical, BID, Djan, Drue DASEN, MD   ondansetron  (ZOFRAN ) tablet 4 mg, 4 mg, Oral, Q6H PRN **OR** ondansetron  (ZOFRAN ) injection 4 mg, 4 mg, Intravenous, Q6H PRN, Mansy, Jan A, MD, 4 mg at 07/04/23 1223   oxyCODONE -acetaminophen  (PERCOCET/ROXICET) 5-325 MG per tablet 1 tablet, 1 tablet, Oral, Q6H PRN, 1 tablet at 07/05/23 1123 **AND** oxyCODONE  (Oxy IR/ROXICODONE ) immediate release  tablet 5 mg, 5 mg, Oral, Q6H PRN, Mansy, Jan A, MD, 5 mg at 07/03/23 2158   pantoprazole  (PROTONIX ) EC tablet 40 mg, 40 mg, Oral, AC breakfast, Mansy, Jan A, MD, 40 mg at 07/04/23 1004   polyethylene glycol (MIRALAX  / GLYCOLAX ) packet 17 g, 17 g, Oral, Daily PRN, Mansy, Jan A, MD   promethazine  (PHENERGAN ) 12.5 mg in sodium chloride  0.9 % 50 mL IVPB, 12.5 mg, Intravenous, Q6H PRN, Dorinda, Prince T, MD   thiamine  (VITAMIN B1) tablet 100 mg, 100 mg, Oral, Daily, Mansy, Jan A, MD, 100 mg at 07/04/23 9042   torsemide  (DEMADEX ) tablet 10 mg, 10 mg, Oral, Daily, Mansy, Jan A, MD, 10 mg at 07/04/23 0957   vitamin A  capsule 10,000 Units, 10,000 Units, Oral, Daily, Mansy, Jan A, MD, 10,000 Units at 07/04/23 9042   zinc  sulfate (50mg  elemental zinc ) capsule 220 mg, 220 mg, Oral, Daily, Dorinda Drue T, MD  Vitals   Vitals:   07/05/23 1041 07/05/23 1123 07/05/23 1427 07/05/23 1615  BP: (!) 91/57 129/68 (!) 94/56 (!) 90/55  Pulse:      Resp:      Temp:   98.5 F (36.9 C) 98.5 F (36.9 C)  TempSrc:   Axillary Axillary  SpO2:    100%  Weight:      Height:        Body mass index is 41.71 kg/m.   Physical Exam   Constitutional: Ill-appearing, morbidly obese elderly female  Eyes: No scleral injection.  HENT: No OP obstruction.  Head: Normocephalic. Neck stiffness and pain in rotation, but not flexion Respiratory: Effort normal, non-labored breathing.  Skin: Warm to touch and mildly diaphoretic.   Neurologic Examination   Mental Status: Severely encephalopathic. Does not answer any questions or follow any commands. Does not reliably blink to threat when eyelids are passively opened and does not make eye contact. Does not make any attempts to communicate, but will moan "alright" repeatedly and in apparent pain when limbs are passively elevated and flexed at elbow and knee, as well as with passive eyelid opening by examiner.  Cranial Nerves: II: No reliable blink to threat. PERRL 4 mm >> 3  mm. III,IV, VI: Eyes are conjugate and at the midline. No nystagmus. V: Reacts to eyelid stimulation bilaterally VII: Grimaces symmetrically VIII: Does not respond to voice IX,X: Gag reflex deferred XI: Unable to assess XII: Unable to assess Motor/Sensory: BUE: Weak withdrawal to pinch equally bilaterally BLE: Weak withdrawal to pinch equally bilaterally Deep Tendon Reflexes: 1+ and symmetric bilateral brachioradialis. Unable to elicit patellar reflexes due to prior knee replacements Cerebellar: Unable to assess Gait: Unable to assess    Labs/Imaging/Neurodiagnostic studies   CBC:  Recent Labs  Lab 11-Jul-2023 0432 07/05/23 0253  WBC 6.8 15.7*  NEUTROABS 5.4 13.7*  HGB 8.4* 8.6*  HCT 25.6* 26.0*  MCV 81.8 80.7  PLT 493* 503*   Basic Metabolic Panel:  Lab Results  Component Value Date   NA 133 (L) 07/05/2023   K 3.8 07/05/2023   CO2 25 07/05/2023   GLUCOSE 76 07/05/2023   BUN 22 07/05/2023   CREATININE 0.88 07/05/2023   CALCIUM  7.4 (L) 07/05/2023   GFRNONAA >60 07/05/2023   GFRAA >60 06/07/2019   Lipid Panel:  Lab Results  Component Value Date   LDLCALC 45 03/08/2023   HgbA1c:  Lab Results  Component Value Date   HGBA1C 4.9 03/07/2023   Urine Drug Screen: No results found for: LABOPIA, COCAINSCRNUR, LABBENZ, AMPHETMU, THCU, LABBARB  Alcohol  Level No results found for: [facility] INR  Lab Results  Component Value Date   INR 1.7 (H) 07/02/2023   APTT  Lab Results  Component Value Date   APTT 78 (H) 03/09/2023     ASSESSMENT  Wendy Sanchez is a 73 y.o. female admitted for sepsis, now with worsening encephalopathy and new onset twitching.  - Exam reveals findings consistent with a severe encephalopathy. She has diffuse pain with gentle passive movement of all 4 extremities by examiner. Also with neck stiffness in rotation. - CT head (6/19): Generalized cerebral atrophy and microvascular disease changes of the supratentorial brain. No acute  intracranial abnormality. - Of note, she has an allergy to iodinated contrast (anaphylaxis) - EEGs: - EEG (6/20):  Intermittent slow, generalized. This study is suggestive of mild diffuse encephalopathy. No seizures or epileptiform discharges were seen throughout the recording. - EEG (6/23): Abnormalities seen consist of generalized periodic discharges with triphasic morphology (GPDs) in addition to continuous generalized slowing. Overall, this study is suggestive of moderate diffuse encephalopathy typically related to toxic-metabolic causes like cefepime  toxicity. No seizures or epileptiform discharges were seen throughout the recording. - Labs: - WBC today is 15.7 (increased from yesterday) - Platelets are elevated (503) - Hgb low at 8.6.  - Recent B12 level was elevated - Recent vitamin A  level was low at 9.4 - Recent zinc  level was low at 29 - AM cortisol on 6/20 was low at 2.5.  - Impression: - Possible underlying etiologies for her worsened encephalopathy include  meningitis and cefepime -neurotoxicity. There may be a component of hospital delirium as well.  - Initial spell in the ED was most likely syncope secondary to hypotension - Her diffuse limb jerking with stimulation may be secondary to Neurontin  toxicity - Seizure with postictal state is also possible, but this is difficult to differentiate from infection with encephalopathy. Her infection could certainly lower her seizure threshold - Vitamin deficiencies due to her prior gastric bypass could also be playing a role.   RECOMMENDATIONS  - Decreasing her Neurontin  to 100 mg TID  - Continue Keppra at 1000 mg BID - Switch cefepime  to an alternate antibiotic due to possible cefepime -neurotoxicity.  - Unable to obtain LP due to anticoagulation - Start empiric meningitis-dose ABX with vancomycin , ceftriaxone , ampicillin and acyclovir. Pharmacy has been consulted and I have discussed the patient with them by telephone.  - Vitamin and  mineral supplementation per Dietician (note from today has been reviewed) - MRI brain with and without contrast if no contraindications ______________________________________________________________________    Bonney SHARK, Britton Perkinson, MD [facility]

## 2023-07-05 NOTE — Telephone Encounter (Signed)
 error

## 2023-07-05 NOTE — Procedures (Signed)
 Patient Name: Wendy Sanchez  MRN: [id number]  Epilepsy Attending: Arlin MALVA Krebs  Referring Physician/Provider: Dorinda Drue ONEIDA, MD  Date: 07/05/2023 Duration: 24.36 mins  Patient history: 73yo f with ams. EEG to evaluate for seizure  Level of alertness: Awake  AEDs during EEG study: None  Technical aspects: This EEG study was done with scalp electrodes positioned according to the 10-20 International system of electrode placement. Electrical activity was reviewed with band pass filter of 1-70Hz , sensitivity of 7 uV/mm, display speed of 51mm/sec with a 60Hz  notched filter applied as appropriate. EEG data were recorded continuously and digitally stored.  Video monitoring was available and reviewed as appropriate.  Description: EEG showed continuous generalized polymorphic 3 to 6 Hz theta-delta slowing. Generalized periodic discharges with triphasic morphology at  0.5-1 Hz were also noted.   Patient was noted to have subtle whole body twitch at 1339.  Concomitant EEG before, during and after the event did not show any EEG changes to suggest seizure.   Hyperventilation and photic stimulation were not performed.     ABNORMALITY - Periodic discharges with triphasic morphology, generalized ( GPDs) - Continuous slow, generalized  IMPRESSION: This study is suggestive of moderate diffuse encephalopathy typically related to toxic-metabolic causes like cefepime  toxicity. No seizures or epileptiform discharges were seen throughout the recording.  Patient was noted to have subtle whole body twitch at 1339 without concomitant EEG change. This was a NON epileptic event.   Wavie Hashimi O Shawnna Pancake

## 2023-07-05 NOTE — Progress Notes (Signed)
 Progress Note   Patient: Wendy Sanchez FMW:[id number] DOB: 04/23/1950 DOA: 07/01/2023     73 DOS: the patient was seen and examined on 07/05/2023      Brief hospital course: From HPI Wendy Sanchez is a 73 y.o. African-American female with medical history significant for asthma, osteoarthritis, giant cell arthritis, combined systolic and diastolic CHF, COPD, DVT, PE and atrial fibrillation on Eliquis , who presented to the emergency room with acute onset of increased weakness.  She was noted to have sediment in her Foley catheter that was changed yesterday however that continued today.  There was a concern about a sacral wound that felt to be tunneling deeper.  The patient had a brief syncopal episode in the ER when her family noted that she was mildly shaking and her eyes rolled back.  She did not appear to to be postictal when she was seen by the EDP.  No reported fever or chills.  No reported nausea or vomiting or abdominal pain.  No reported chest pain or palpitations.  No cough or wheezing or dyspnea.  No bleeding diathesis.  The patient has been getting twice a week dressing change for her sacral decubitus ulcer.   ED Course: When she came to the ER, BP was 89/70 and later on 108/63 with a heart rate of 52 and later 101 and respiratory rate of 17 and later 32.  BMP showed calcium  of 7.6 and was otherwise unremarkable.  CBC showed leukocytosis of 12.1.  Platelets were 588.  Hemoglobin and hematocrit were 10.2 and 31.5 above previous levels.       Assessment and Plan: Sepsis due to gram-negative UTI (HCC) - Sepsis manifested by leukocytosis, tachycardia and tachypnea. Patient received sepsis fluid in the emergency room Sepsis also compounded by decubitus sacral ulcer Vancomycin  has been discontinued as MRSA screen was negative Continue cefepime  Wound care on board we appreciate input Culture results showing Pseudomonas in the wound, urine cultures growing E. coli, Serratia as well as  Pseudomonas   Acute metabolic encephalopathy Noted to have some tremors involving the right face and legs as well as bilateral upper extremities EEG obtained that did not show any epileptiform waves Patient initiated on Keppra and neurology was consulted Continue to monitor neurochecks closely  Syncope likely secondary to hypotension Continue neurochecks closely Continue management for sepsis as above   Hypokalemia Continue repletion and monitoring   Hypotension This is likely in the setting of severe sepsis Continue midodrine    Chronic obstructive pulmonary disease (COPD) (HCC) - Given her somnolence we are checking ABG level. Continue her as needed DuoNebs.   Peripheral neuropathy Continue Neurontin  while holding off for sedation.   GERD without esophagitis The patient will continue PPI therapy as well as H2 blocker therapy.   Paroxysmal atrial fibrillation (HCC) Continue Eliquis . - Patient notes to have tachycardia today and therefore metoprolol  resumed     DVT prophylaxis: Eliquis    Advanced Care Planning:  Code Status: The patient is DNR only.   Family Communication:  The plan of care was discussed in details with the patient (and family).     Disposition Plan: Back to previous home environment   Consults called: Neurology.   Subjective:  Patient's mental status worsened today Did have some episodes of SVT Noted to have some tremors involving the right face and legs as well as bilateral upper extremities EEG obtained that did not show any epileptiform waves Patient initiated on Keppra and neurology was consulted   Physical Exam:  GENERAL:  73 y.o.-year-old patient lying in the bed with no acute distress.  EYES: Pupils equal, round, reactive to light and accommodation. No scleral icterus. Extraocular muscles intact.  HEENT: Head atraumatic, normocephalic. Oropharynx and nasopharynx clear.  NECK:  Supple, no jugular venous distention. No thyroid   enlargement, no tenderness.  LUNGS: Normal breath sounds bilaterally, no wheezing, rales,rhonchi or crepitation. No use of accessory muscles of respiration.  CARDIOVASCULAR: Regular rate and rhythm, S1, S2 normal. No murmurs, rubs, or gallops.  ABDOMEN: Soft, nondistended, nontender. Bowel sounds present. No organomegaly or mass.  EXTREMITIES: No pedal edema, cyanosis, or clubbing.  NEUROLOGIC: Cranial nerves II through XII are intact. Muscle strength 5/5 in all extremities. Sensation intact. Gait not checked.  PSYCHIATRIC: The patient is alert and oriented x 3.  Normal affect and good eye contact. SKIN: Deep stage III-IV sacral decubitus ulcer with clean granulation tissue and [place] eschar.  No other obvious rash, lesion, or ulcer.    Data Reviewed      Latest Ref Rng & Units 07/05/2023    2:53 AM 07/04/2023    4:32 AM 07/03/2023    5:10 AM  CBC  WBC 4.0 - 10.5 K/uL 15.7  6.8  11.3   Hemoglobin 12.0 - 15.0 g/dL 8.6  8.4  9.3   Hematocrit 36.0 - 46.0 % 26.0  25.6  29.0   Platelets 150 - 400 K/uL 503  493  514        Latest Ref Rng & Units 07/05/2023    2:53 AM 07/04/2023    4:32 AM 07/03/2023    5:10 AM  BMP  Glucose 70 - 99 mg/dL 76  91  96   BUN 8 - 23 mg/dL 22  20  15    Creatinine 0.44 - 1.00 mg/dL 9.11  9.08  9.18   Sodium 135 - 145 mmol/L 133  132  135   Potassium 3.5 - 5.1 mmol/L 3.8  3.4  3.4   Chloride 98 - 111 mmol/L 102  102  103   CO2 22 - 32 mmol/L 25  24  23    Calcium  8.9 - 10.3 mg/dL 7.4  7.3  7.3       Vitals:   07/05/23 1041 07/05/23 1123 07/05/23 1427 07/05/23 1615  BP: (!) 91/57 129/68 (!) 94/56 (!) 90/55  Pulse:      Resp:      Temp:   98.5 F (36.9 C) 98.5 F (36.9 C)  TempSrc:   Axillary Axillary  SpO2:    100%  Weight:      Height:         Author: Drue ONEIDA Potter, MD 07/05/2023 5:51 PM  For on call review [email or web address].

## 2023-07-05 NOTE — Telephone Encounter (Signed)
 Patient sister called stated patient is in the hospital. Patient was scheduled for VV appt on last Friday.

## 2023-07-05 NOTE — Progress Notes (Incomplete)
     Significant event: Rapid response  NAME: Wendy Sanchez MRN: [id number] DOB : 11-19-1950  Reason for rapid response:  --Tachyarrhythmia with hypotension --Worsening encephalopathy  Concern as stated by nurse / staff    communication prior to: Rapid response  Patient is not alert, is not following commands and opens eyes to physical stimulation only at this time. Will continue to monitor  Patient went into a sustrained HR of 170s for about 5 minutes. Took two blood pressures during this, both systolic pressure 90s. I pulled her PRN lopressor  5mg , but I did not give it because her HR went back down to 100s. She has gone into SVT before several times while here. It happened too fast to do a 12 lead EKG. Just giving you a update  ...oxygen 98 on 2L [place] ...its back to 170 .[id number] systolic 59 MAP     Pertinent findings on chart review: 1-6/19: admitted with  ----syncope  ----hypotension(on midodrine ) ----sepsis secondary to UTI (E coli, Serratia, Pseudomonas) 2-6/20-6/23: Progressive decline in mentation  3-6/23: Seen by neurology.   ----meningitis ----cefepime  neurotoxicity (EEG neg for seizure)    Patient Assessment Upon my arrival, rapid response team at bedside      07/05/2023    8:14 PM 07/05/2023    4:15 PM 07/05/2023    2:27 PM  Vitals with BMI  Systolic 93 90 94  Diastolic 58 55 56  Physical Exam Constitutional:      Comments: Patient lying flat in bed, O2 at 2 L satting at 100%.  GCS of 5 and head flexed back, not following commands, but moaning out while ABG is being collected   Cardiovascular:     Rate and Rhythm: Tachycardia present.  Pulmonary:     Effort: Tachypnea present.   Neurological:     Mental Status: She is lethargic.     GCS: GCS eye subscore is 2. GCS verbal subscore is 1. GCS motor subscore is 2.       Initial Assessment and  Interventions   Assessment:  1-Acute worsening encephalopathy 2-Possible meningitis 3-Tachyarrhythmia--SVT  versus rapid A-fib(hx of both) 4-Hypotension, unstable tachyarrhythmia vs septic shock 5-Possible cefepime  induced neurotoxicity 6-UTI (E. coli, Serratia and Pseudomonas)  Plan: Transfer to stepdown Possible need for intubation for airway protection-concern discussed with ICU NP, curb sided Sepsis workup (rainbow labs, CXR, UA, ABG) Continue current antibiotics for meningitis CT head Sepsis fluids Strict n.p.o. with aspiration precautions IR consult for LP in the a.m. Hold all oral meds including Eliquis  for possible LP in the a.m.    Workup Sepsis workup notable for lactic acid of 4.5  Plan: -Continue above plan    CRITICAL CARE Performed by: Delayne LULLA Solian   Total critical care time: 100 minutes  Critical care time was exclusive of separately billable procedures and treating other patients.  Critical care was necessary to treat or prevent imminent or life-threatening deterioration.  Critical care was time spent personally by me on the following activities: development of treatment plan with patient and/or surrogate as well as nursing, discussions with consultants, evaluation of patient's response to treatment, examination of patient, obtaining history from patient or surrogate, ordering and performing treatments and interventions, ordering and review of laboratory studies, ordering and review of radiographic studies, pulse oximetry and re-evaluation of patient's condition.

## 2023-07-05 NOTE — Consult Note (Addendum)
 Pharmacy Antibiotic Note  Wendy Sanchez is a 73 y.o. female admitted on 07/01/2023 with meningitis.  Pharmacy has been consulted for vancomycin , ceftriaxone , acyclovir , and ampicillin  dosing. Possible underlying etiologies for her worsened encephalopathy include meningitis and cefepime -neurotoxicity. Tmax 100.3. hypotensive, WBC 6.8 > 15.7.   Pt was being treated for a Ucx infection and growing:  >=100,000 COLONIES/mL ESCHERICHIA COLI  >=100,000 COLONIES/mL SERRATIA MARCESCENS  >=100,000 COLONIES/mL PSEUDOMONAS AERUGINOSA   Wound growing PSA.   Plan: Will start vancomycin  1000 mg q12H using the [place] vancomycin  nomogram.   Start ceftriaxone  2 g q12H. Due to PSA growing in the wound culture may need to escalate therapy to meropenem . Defer to the AM team.   Start acyclovir  10 mg/kg q8H   Start ampicillin  2 g q4H     Height: 5' 8 (172.7 cm) Weight: 124.4 kg (274 lb 4.8 oz) IBW/kg (Calculated) : 63.9  Temp (24hrs), Avg:99.1 F (37.3 C), Min:98.5 F (36.9 C), Max:100.3 F (37.9 C)  Recent Labs  Lab 07/01/23 1542 07/01/23 1645 07/01/23 1936 07/02/23 0238 07/02/23 0442 07/03/23 0510 07/04/23 0432 07/05/23 0253  WBC 12.1*  --   --   --  7.8 11.3* 6.8 15.7*  CREATININE 0.73  --  0.63 0.66  --  0.81 0.91 0.88  LATICACIDVEN  --  1.7  --   --   --   --   --   --     Estimated Creatinine Clearance: 80.4 mL/min (by C-G formula based on SCr of 0.88 mg/dL).    Allergies  Allergen Reactions   Hydrocodone  Swelling   Hydrocodone -Acetaminophen  Swelling    hands   Iodinated Contrast Media Anaphylaxis   Iodine Anaphylaxis and Swelling    Iv dye  02/03/21-Ispoke to patient and she had IV contrast  X4 last year and has had no problem. ( Last CT abdomen with contrast was on 01/02/21.-   Nitrofurantoin    Other Other (See Comments)    ALLERGY TO METAL - BLACKENS SKIN AND CAUSES A RASH   Tape Swelling and Other (See Comments)    Skin comes off.  Paper tape is ok tegaderm OK    Shellfish-Derived Products    Wound Dressing Adhesive    Peanut Oil Other (See Comments)    ** Nuts cause runny nose   Shellfish Allergy Nausea And Vomiting and Swelling    Episode of GI infection after eating clam chowder. Still eats shrimp and other seafood      Thank you for allowing pharmacy to be a part of this patient's care.  Cathaleen GORMAN Blanch, PharmD, BCPS 07/05/2023 8:55 PM

## 2023-07-05 NOTE — Progress Notes (Incomplete)
     Significant event  rapid response CROSS COVER NOTE  NAME: Wendy Sanchez MRN: [id number] DOB : 11/07/1950    Concern as stated by nurse / staff    Patient went into a sustrained HR of 170s for about 5 minutes. Took two blood pressures during this, both systolic pressure 90s. I pulled her PRN lopressor  5mg , but I did not give it because her HR went back down to 100s. She has gone into SVT before several times while here. It happened too fast to do a 12 lead EKG. Just giving you a update  ...oxygen 98 on 2L [place]...its back to 170...77 systolic 59 MAP     Pertinent findings on chart review:   Patient Assessment      07/05/2023    8:14 PM 07/05/2023    4:15 PM 07/05/2023    2:27 PM  Vitals with BMI  Systolic 93 90 94  Diastolic 58 55 56     Assessment and  Interventions   Assessment:  Plan: X X X

## 2023-07-05 NOTE — Procedures (Signed)
 Patient Name: Wendy Sanchez  MRN: [id number]  Epilepsy Attending: Arlin MALVA Krebs  Referring Physician/Provider: Lawence Madison LABOR, MD  Date: 07/02/2023 Duration: 21.28 mins  Patient history: 73yo F with syncope. EEG to evaluate for seizure  Level of alertness: Awake  AEDs during EEG study: None  Technical aspects: This EEG study was done with scalp electrodes positioned according to the 10-20 International system of electrode placement. Electrical activity was reviewed with band pass filter of 1-70Hz , sensitivity of 7 uV/mm, display speed of 18mm/sec with a 60Hz  notched filter applied as appropriate. EEG data were recorded continuously and digitally stored.  Video monitoring was available and reviewed as appropriate.  Description: The posterior dominant rhythm consists of 8-9 Hz activity of moderate voltage (25-35 uV) seen predominantly in posterior head regions, symmetric and reactive to eye opening and eye closing. EEG showed intermittent generalized 3 to 6 Hz theta-delta slowing. Hyperventilation and photic stimulation were not performed.     ABNORMALITY - Intermittent slow, generalized  IMPRESSION: This study is suggestive of mild diffuse encephalopathy. No seizures or epileptiform discharges were seen throughout the recording.  [name]

## 2023-07-06 ENCOUNTER — Inpatient Hospital Stay

## 2023-07-06 ENCOUNTER — Other Ambulatory Visit: Payer: Self-pay

## 2023-07-06 DIAGNOSIS — N390 Urinary tract infection, site not specified: Secondary | ICD-10-CM | POA: Diagnosis not present

## 2023-07-06 DIAGNOSIS — A4150 Gram-negative sepsis, unspecified: Secondary | ICD-10-CM | POA: Diagnosis not present

## 2023-07-06 LAB — GLUCOSE, CAPILLARY
Glucose-Capillary: 82 mg/dL (ref 70–99)
Glucose-Capillary: 78 mg/dL (ref 70–99)
Glucose-Capillary: 73 mg/dL (ref 70–99)
Glucose-Capillary: 72 mg/dL (ref 70–99)

## 2023-07-06 LAB — LACTIC ACID, PLASMA
Lactic Acid, Venous: 1.2 mmol/L (ref 0.5–1.9)
Lactic Acid, Venous: 4.5 mmol/L (ref 0.5–1.9)

## 2023-07-06 LAB — COMPREHENSIVE METABOLIC PANEL WITH GFR
ALT: 14 U/L (ref 0–44)
ALT: 16 U/L (ref 0–44)
AST: 23 U/L (ref 15–41)
AST: 24 U/L (ref 15–41)
Albumin: <1.5 g/dL — ABNORMAL LOW (ref 3.5–5.0)
Albumin: <1.5 g/dL — ABNORMAL LOW (ref 3.5–5.0)
Alkaline Phosphatase: 122 U/L (ref 38–126)
Alkaline Phosphatase: 123 U/L (ref 38–126)
Anion gap: 6 (ref 5–15)
Anion gap: 7 (ref 5–15)
BUN: 19 mg/dL (ref 8–23)
BUN: 22 mg/dL (ref 8–23)
CO2: 24 mmol/L (ref 22–32)
CO2: 25 mmol/L (ref 22–32)
Calcium: 7.4 mg/dL — ABNORMAL LOW (ref 8.9–10.3)
Calcium: 7.4 mg/dL — ABNORMAL LOW (ref 8.9–10.3)
Chloride: 105 mmol/L (ref 98–111)
Chloride: 107 mmol/L (ref 98–111)
Creatinine, Ser: 0.83 mg/dL (ref 0.44–1.00)
Creatinine, Ser: 0.88 mg/dL (ref 0.44–1.00)
GFR, Estimated: >60 mL/min (ref >60)
GFR, Estimated: >60 mL/min (ref >60)
Glucose, Bld: 75 mg/dL (ref 70–99)
Glucose, Bld: 90 mg/dL (ref 70–99)
Potassium: 2.8 mmol/L — ABNORMAL LOW (ref 3.5–5.1)
Potassium: 3.1 mmol/L — ABNORMAL LOW (ref 3.5–5.1)
Sodium: 136 mmol/L (ref 135–145)
Sodium: 138 mmol/L (ref 135–145)
Total Bilirubin: 0.3 mg/dL (ref 0.0–1.2)
Total Bilirubin: 0.6 mg/dL (ref 0.0–1.2)
Total Protein: 4.2 g/dL — ABNORMAL LOW (ref 6.5–8.1)
Total Protein: 4.3 g/dL — ABNORMAL LOW (ref 6.5–8.1)

## 2023-07-06 LAB — CULTURE, BLOOD (ROUTINE X 2)
Culture: NO GROWTH
Culture: NO GROWTH
Special Requests: ADEQUATE

## 2023-07-06 LAB — CBC WITH DIFFERENTIAL/PLATELET
Abs Immature Granulocytes: 0.08 10*3/uL — ABNORMAL HIGH (ref 0.00–0.07)
Abs Immature Granulocytes: 0.09 10*3/uL — ABNORMAL HIGH (ref 0.00–0.07)
Basophils Absolute: 0 10*3/uL (ref 0.0–0.1)
Basophils Absolute: 0 10*3/uL (ref 0.0–0.1)
Basophils Relative: 0 %
Basophils Relative: 0 %
Eosinophils Absolute: 0.3 10*3/uL (ref 0.0–0.5)
Eosinophils Absolute: 0.3 10*3/uL (ref 0.0–0.5)
Eosinophils Relative: 3 %
Eosinophils Relative: 3 %
HCT: 26 % — ABNORMAL LOW (ref 36.0–46.0)
HCT: 27 % — ABNORMAL LOW (ref 36.0–46.0)
Hemoglobin: 8.7 g/dL — ABNORMAL LOW (ref 12.0–15.0)
Hemoglobin: 8.9 g/dL — ABNORMAL LOW (ref 12.0–15.0)
Immature Granulocytes: 1 %
Immature Granulocytes: 1 %
Lymphocytes Relative: 10 %
Lymphocytes Relative: 11 %
Lymphs Abs: 1 10*3/uL (ref 0.7–4.0)
Lymphs Abs: 1.2 10*3/uL (ref 0.7–4.0)
MCH: 26.4 pg (ref 26.0–34.0)
MCH: 27.1 pg (ref 26.0–34.0)
MCHC: 33 g/dL (ref 30.0–36.0)
MCHC: 33.5 g/dL (ref 30.0–36.0)
MCV: 80.1 fL (ref 80.0–100.0)
MCV: 81 fL (ref 80.0–100.0)
Monocytes Absolute: 0.3 10*3/uL (ref 0.1–1.0)
Monocytes Absolute: 0.4 10*3/uL (ref 0.1–1.0)
Monocytes Relative: 3 %
Monocytes Relative: 4 %
Neutro Abs: 8.8 10*3/uL — ABNORMAL HIGH (ref 1.7–7.7)
Neutro Abs: 9.2 10*3/uL — ABNORMAL HIGH (ref 1.7–7.7)
Neutrophils Relative %: 82 %
Neutrophils Relative %: 82 %
Platelets: 460 10*3/uL — ABNORMAL HIGH (ref 150–400)
Platelets: 492 10*3/uL — ABNORMAL HIGH (ref 150–400)
RBC: 3.21 MIL/uL — ABNORMAL LOW (ref 3.87–5.11)
RBC: 3.37 MIL/uL — ABNORMAL LOW (ref 3.87–5.11)
RDW: 18.4 % — ABNORMAL HIGH (ref 11.5–15.5)
RDW: 18.6 % — ABNORMAL HIGH (ref 11.5–15.5)
WBC: 10.7 10*3/uL — ABNORMAL HIGH (ref 4.0–10.5)
WBC: 11.2 10*3/uL — ABNORMAL HIGH (ref 4.0–10.5)
nRBC: 0 % (ref 0.0–0.2)
nRBC: 0 % (ref 0.0–0.2)

## 2023-07-06 LAB — URINALYSIS, COMPLETE (UACMP) WITH MICROSCOPIC
Bilirubin Urine: NEGATIVE
Glucose, UA: NEGATIVE mg/dL
Ketones, ur: NEGATIVE mg/dL
Nitrite: NEGATIVE
Protein, ur: 30 mg/dL — AB
RBC / HPF: >50 RBC/hpf (ref 0–5)
Specific Gravity, Urine: 1.014 (ref 1.005–1.030)
WBC, UA: >50 WBC/hpf (ref 0–5)
pH: 6 (ref 5.0–8.0)

## 2023-07-06 LAB — PROTIME-INR
INR: 1.8 — ABNORMAL HIGH (ref 0.8–1.2)
Prothrombin Time: 21.2 s — ABNORMAL HIGH (ref 11.4–15.2)

## 2023-07-06 LAB — PROCALCITONIN: Procalcitonin: 0.71 ng/mL

## 2023-07-06 LAB — APTT: aPTT: 49 s — ABNORMAL HIGH (ref 24–36)

## 2023-07-06 MED ORDER — POTASSIUM CHLORIDE 10 MEQ/100ML IV SOLN
10.0000 meq | INTRAVENOUS | Status: AC
  Administered 2023-07-06 (×4): 10 meq via INTRAVENOUS
  Filled 2023-07-06 (×4): qty 100

## 2023-07-06 MED ORDER — LIDOCAINE HCL (PF) 1 % IJ SOLN
5.0000 mL | Freq: Once | INTRAMUSCULAR | Status: AC
  Administered 2023-07-06: 5 mL
  Filled 2023-07-06: qty 5

## 2023-07-06 MED ORDER — SODIUM CHLORIDE 0.9 % IV SOLN
2.0000 g | Freq: Three times a day (TID) | INTRAVENOUS | Status: DC
  Administered 2023-07-06 – 2023-07-09 (×10): 2 g via INTRAVENOUS
  Filled 2023-07-06 (×11): qty 40

## 2023-07-06 MED ORDER — GADOBUTROL 1 MMOL/ML IV SOLN
10.0000 mL | Freq: Once | INTRAVENOUS | Status: AC | PRN
Start: 2023-07-06 — End: 2023-07-06
  Administered 2023-07-06: 10 mL via INTRAVENOUS

## 2023-07-06 MED ORDER — MORPHINE SULFATE (PF) 2 MG/ML IV SOLN
2.0000 mg | Freq: Once | INTRAVENOUS | Status: AC
  Administered 2023-07-06: 2 mg via INTRAVENOUS
  Filled 2023-07-06: qty 1

## 2023-07-06 MED ORDER — LIDOCAINE 1 % OPTIME INJ - NO CHARGE
5.0000 mL | Freq: Once | INTRAMUSCULAR | Status: AC
  Administered 2023-07-06: 5 mL via SUBCUTANEOUS

## 2023-07-06 MED ORDER — LACTATED RINGERS IV SOLN: INTRAVENOUS | Status: DC

## 2023-07-06 MED ORDER — LACTATED RINGERS IV SOLN
150.0000 mL/h | INTRAVENOUS | Status: AC
  Administered 2023-07-06 (×4): 150 mL/h via INTRAVENOUS

## 2023-07-06 NOTE — Procedures (Signed)
 PROCEDURE SUMMARY:  Fluoroscopic guided lumbar puncture attempted at the levels of L5-S1 and L2-3.  There was no return of CSF fluid and no labs we sent.  No immediate complications.  Pt tolerated well.   EBL = none  Please see full dictation in imaging section of Epic for procedure details.   Electronically Signed: Renita Brocks M Sherle Mello, PA-C 07/06/2023, 11:55 AM

## 2023-07-06 NOTE — Plan of Care (Signed)

## 2023-07-06 NOTE — Consult Note (Signed)
 Infectious Disease     Reason for Consult: UTI, sacral wound, Encephalitis   Referring Physician: Dr Dorinda Date of Admission:  07/01/2023   Principal Problem:   Sepsis due to gram-negative UTI [facility]) Active Problems:   Chronic obstructive pulmonary disease (COPD) (HCC)   Hypotension   Peripheral neuropathy   Syncope   Paroxysmal atrial fibrillation (HCC)   GERD without esophagitis   Asthma-COPD overlap syndrome (HCC)   HPI: Wendy Sanchez is a 73 y.o. female chronically quite ill woman who is bedbound and has contractures and a colostomy as well as a Foley admitted June 19 with acute onset of weakness.  She has a history of asthma osteoarthritis giant cell arteritis CHF COPD DVT PE A-fib on Eliquis .  She has a chronic Foley and it was noted to have sediment in it.  She also has a sacral wound that was tunneling deeper.  In the ED she was hypotensive but alert.  She was afebrile.  She had had a [place] count of 12 normal renal function lactate of 1.7 anemia with hemoglobin of 9.3 elevated platelets.  Viral respiratory panel was negative.  Urinalysis showed 21-50 Brisbon cells.  Imaging initially showed CT of the head generalized cerebral atrophy but negative otherwise.  She was started on cefepime  and vancomycin  June 19.  She then had what appeared to be either a syncopal episode or seizure in the ED. She has been seen by neurology and is undergoing a lumbar puncture.  She had carotid Dopplers done At baseline she is bedbound has chronic ankle contractures plantarflexion and has a colostomy as well as a Foley catheter.  She was at [employer] before coming home where her sister cares for her  Microdata reviewed and urine culture from her Foley catheter grew E. coli, Serratia and Pseudomonas.  These were all sensitive to cefepime  which she is continued on.   She has PMHx of allergic rhinitis, arthritis, asthma, chest pain, CHF, atrial fibrillation (on Eliquis ), COPD, DVT of right leg (2020),  fibromyalgia, GERD, headache, HLD, HTN, morbid obesity, nonischemic cardiomyopathy with EF of 25-30% in 2018, osteoarthritis, PE, bilateral knee replacements, PVC, sleep apnea, lumbar spinal stenosis, s/p gastric banding, temporal arteritis (2022), tinnitus, and vertigo  Past Medical History:  Diagnosis Date   Allergic rhinitis    Arthritis    Asthma    problems with fumes and aerosols cause asthma   Chest pain 10/19/2016   CHF (congestive heart failure) (HCC)    Complication of anesthesia    awakens during surgery; has occurred with last 3-4 surgeries    COPD (chronic obstructive pulmonary disease) (HCC)    DVT (deep venous thrombosis) (HCC) 07/13/2018   Right leg   Environmental allergies    fumes    Fibromyalgia    GERD (gastroesophageal reflux disease)    Headache    History of bronchitis    Hx of total knee arthroplasty 12/13/2015   Hyperlipidemia    Hypertension    Morbid obesity with BMI of 45.0-49.9, adult (HCC) 07/25/2015   NICM (nonischemic cardiomyopathy) (HCC)    a. ? PVC mediated;  b. 06/2016 Echo: EF 25-30%, mild LVH, mild LAE, mild AI/MR/TR/PR; c. 08/2016 Cath: nl cors, EF 25%.   Numbness    hands bilat when driving; improves when not preforming task    Osteoarthritis of left knee 08/02/2015   PE (pulmonary thromboembolism) (HCC) 07/13/2018   Pes anserinus bursitis of left knee 05/18/2016   Presence of right artificial knee joint 05/18/2016  PVC (premature ventricular contraction) 06/04/2017   PVC's (premature ventricular contractions)    a. 11/2016 Amio started;  b. 11/29/16 48h Holter: 57574 PVC's (41%); c. 12/2016 24h Holter: 18040 PVC's (15%); c. 02/2017 48h Holter: 37262 PVC's (41%).   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   Temporal arteritis (HCC) 05/01/2020   Tinnitus    comes and goes    Unilateral primary osteoarthritis,  right knee 12/13/2015   Vertigo    none for over 2 yrs   Past Surgical History:  Procedure Laterality Date   ABDOMINAL HYSTERECTOMY  1979   left ovary remains   ABDOMINAL HYSTERECTOMY     ARTERY BIOPSY Left 06/07/2019   Procedure: BIOPSY TEMPORAL ARTERY;  Surgeon: Marea Selinda RAMAN, MD;  Location: [facility] ORS;  Service: Vascular;  Laterality: Left;   CHOLECYSTECTOMY     COLON SURGERY     COLONOSCOPY     COLONOSCOPY N/A 10/30/2019   Procedure: COLONOSCOPY;  Surgeon: Maryruth Ole DASEN, MD;  Location: [facility] ENDOSCOPY;  Service: Endoscopy;  Laterality: N/A;   ESOPHAGOGASTRODUODENOSCOPY N/A 10/30/2019   Procedure: ESOPHAGOGASTRODUODENOSCOPY (EGD);  Surgeon: Maryruth Ole DASEN, MD;  Location: [facility] ENDOSCOPY;  Service: Endoscopy;  Laterality: N/A;   fibrous tissue removed from right shoulder and back of neck      2 years ago    FLOOR OF MOUTH BIOPSY N/A 02/28/2019   Procedure: SALIVARY GLAND BIOPSY;  Surgeon: Milissa Hamming, MD;  Location: [facility] ORS;  Service: ENT;  Laterality: N/A;   FRACTURE SURGERY     left small finger   GANGLION CYST EXCISION     GASTRIC BYPASS  1980   states had bypass with banding and band left in -   GASTRIC BYPASS OPEN     JOINT REPLACEMENT Bilateral 2017   knees   KNEE SURGERY Bilateral    both knees  2001   LAPAROSCOPIC SIGMOID COLECTOMY Left 09/13/2020   at [facility] for a perforated sigmoid diverticulitis   NECK SURGERY N/A 1205/19   ganglion cyst removal, benigh    PARTIAL COLECTOMY  09/2020   PULMONARY THROMBECTOMY Bilateral 07/14/2018   Procedure: PULMONARY THROMBECTOMY;  Surgeon: Marea Selinda RAMAN, MD;  Location: [facility] INVASIVE CV LAB;  Service: Cardiovascular;  Laterality: Bilateral;   PVC ABLATION N/A 06/04/2017   Procedure: PVC ABLATION;  Surgeon: Waddell Danelle ORN, MD;  Location: [facility] INVASIVE CV LAB;  Service: Cardiovascular;  Laterality: N/A;   RIGHT/LEFT HEART CATH AND CORONARY ANGIOGRAPHY Bilateral 08/12/2016   Procedure: Right/Left Heart Cath and Coronary  Angiography;  Surgeon: Florencio Cara BIRCH, MD;  Location: [facility] INVASIVE CV LAB;  Service: Cardiovascular;  Laterality: Bilateral;   RIGHT/LEFT HEART CATH AND CORONARY ANGIOGRAPHY N/A 03/16/2023   Procedure: RIGHT/LEFT HEART CATH AND CORONARY ANGIOGRAPHY;  Surgeon: Rolan Ezra RAMAN, MD;  Location: [facility] INVASIVE CV LAB;  Service: Cardiovascular;  Laterality: N/A;   TONSILLECTOMY     age 50   TOTAL KNEE ARTHROPLASTY Left 08/02/2015   Procedure: LEFT TOTAL KNEE ARTHROPLASTY;  Surgeon: Lonni CINDERELLA Poli, MD;  Location: [facility] ORS;  Service: Orthopedics;  Laterality: Left;   TOTAL KNEE ARTHROPLASTY Right 12/13/2015   Procedure: RIGHT TOTAL KNEE ARTHROPLASTY;  Surgeon: Lonni CINDERELLA Poli, MD;  Location: [facility] OR[facility];  Service: Orthopedics;  Laterality: Right;   Social History   Tobacco Use   Smoking status: Former    Current packs/day:  0.00    Average packs/day: 0.3 packs/day for 50.0 years (12.5 ttl pk-yrs)    Types: Cigarettes    Start date: 08/07/1970    Quit date: 08/06/2020    Years since quitting: 2.9   Smokeless tobacco: Current    Types: Chew  Vaping Use   Vaping status: Never Used  Substance Use Topics   Alcohol  use: No    Alcohol /week: 0.0 standard drinks of alcohol    Drug use: No   Family History  Problem Relation Age of Onset   Hypertension Father    Deep vein thrombosis Father    Dementia Mother    Diabetes Sister    Congestive Heart Failure Sister    Congestive Heart Failure Brother    Congestive Heart Failure Daughter    Congestive Heart Failure Son    Breast cancer Maternal Aunt        great aunt    Allergies:  Allergies  Allergen Reactions   Hydrocodone  Swelling   Hydrocodone -Acetaminophen  Swelling    hands   Iodinated Contrast Media Anaphylaxis   Iodine Anaphylaxis and Swelling    Iv dye  02/03/21-Ispoke to patient and she had IV contrast  X4 last year and has had no problem. ( Last CT abdomen with contrast was on 01/02/21.-   Nitrofurantoin    Other Other  (See Comments)    ALLERGY TO METAL - BLACKENS SKIN AND CAUSES A RASH   Tape Swelling and Other (See Comments)    Skin comes off.  Paper tape is ok tegaderm OK   Shellfish-Derived Products    Wound Dressing Adhesive    Peanut Oil Other (See Comments)    ** Nuts cause runny nose   Shellfish Allergy Nausea And Vomiting and Swelling    Episode of GI infection after eating clam chowder. Still eats shrimp and other seafood    Current antibiotics: Antibiotics Given (last 72 hours)     Date/Time Action Medication Dose Rate   07/03/23 2041 New Bag/Given   ceFEPIme  (MAXIPIME ) 2 g in sodium chloride  0.9 % 100 mL IVPB 2 g 200 mL/hr   07/04/23 0005 New Bag/Given   vancomycin  (VANCOREADY) IVPB 1750 mg/350 mL 1,750 mg 175 mL/hr   07/04/23 0406 New Bag/Given   ceFEPIme  (MAXIPIME ) 2 g in sodium chloride  0.9 % 100 mL IVPB 2 g 200 mL/hr   07/04/23 1141 New Bag/Given   ceFEPIme  (MAXIPIME ) 2 g in sodium chloride  0.9 % 100 mL IVPB 2 g 200 mL/hr   07/04/23 2048 New Bag/Given   ceFEPIme  (MAXIPIME ) 2 g in sodium chloride  0.9 % 100 mL IVPB 2 g 200 mL/hr   07/05/23 0110 New Bag/Given   vancomycin  (VANCOREADY) IVPB 1750 mg/350 mL 1,750 mg 175 mL/hr   07/05/23 0418 New Bag/Given   ceFEPIme  (MAXIPIME ) 2 g in sodium chloride  0.9 % 100 mL IVPB 2 g 200 mL/hr   07/05/23 1412 New Bag/Given   ceFEPIme  (MAXIPIME ) 2 g in sodium chloride  0.9 % 100 mL IVPB 2 g 200 mL/hr   07/05/23 2045 New Bag/Given   ceFEPIme  (MAXIPIME ) 2 g in sodium chloride  0.9 % 100 mL IVPB 2 g 200 mL/hr   07/05/23 2235 New Bag/Given   acyclovir (ZOVIRAX) 880 mg in dextrose  5 % 250 mL IVPB 880 mg 267.6 mL/hr   07/05/23 2245 New Bag/Given   ampicillin (OMNIPEN) 2 g in sodium chloride  0.9 % 100 mL IVPB 2 g 300 mL/hr   07/05/23 2351 New Bag/Given  [patient in another room prior to  this RN assuming care]   cefTRIAXone  (ROCEPHIN ) 2 g in sodium chloride  0.9 % 100 mL IVPB 2 g 200 mL/hr   07/06/23 0040 New Bag/Given   vancomycin  (VANCOCIN ) IVPB 1000  mg/200 mL premix 1,000 mg 200 mL/hr   07/06/23 0159 New Bag/Given   ampicillin (OMNIPEN) 2 g in sodium chloride  0.9 % 100 mL IVPB 2 g 300 mL/hr   07/06/23 0521 New Bag/Given   ampicillin (OMNIPEN) 2 g in sodium chloride  0.9 % 100 mL IVPB 2 g 300 mL/hr   07/06/23 0556 New Bag/Given   acyclovir (ZOVIRAX) 880 mg in dextrose  5 % 250 mL IVPB 880 mg 267.6 mL/hr   07/06/23 0959 New Bag/Given   ampicillin (OMNIPEN) 2 g in sodium chloride  0.9 % 100 mL IVPB 2 g 300 mL/hr   07/06/23 1053 New Bag/Given   cefTRIAXone  (ROCEPHIN ) 2 g in sodium chloride  0.9 % 100 mL IVPB 2 g 200 mL/hr   07/06/23 1252 New Bag/Given   vancomycin  (VANCOCIN ) IVPB 1000 mg/200 mL premix 1,000 mg 200 mL/hr       MEDICATIONS:  ascorbic acid   500 mg Oral BID   calcium  carbonate  2 tablet Oral Q breakfast   cetirizine   10 mg Oral QPM   Chlorhexidine  Gluconate Cloth  6 each Topical Daily   cholecalciferol   1,000 Units Oral Daily   cyanocobalamin   1,000 mcg Oral Daily   ferrous sulfate   325 mg Oral BID WC   fluconazole   100 mg Oral Daily   folic acid   1 mg Oral Daily   gabapentin   100 mg Oral TID   lactose free nutrition  237 mL Oral TID WC   levETIRAcetam  1,000 mg Intravenous Q12H   liver oil-zinc  oxide   Topical BID   metoprolol  succinate  12.5 mg Oral Daily   midodrine   5 mg Oral TID AC   montelukast   10 mg Oral QPM   multivitamin with minerals  1 tablet Oral BID   nystatin  cream   Topical BID   pantoprazole   40 mg Oral AC breakfast   thiamine   100 mg Oral Daily   torsemide   10 mg Oral Daily   vitamin A   10,000 Units Oral Daily   zinc  sulfate (50mg  elemental zinc )  220 mg Oral Daily    Review of Systems - unable to obtain  OBJECTIVE: Temp:  [97.9 F (36.6 C)-99.3 F (37.4 C)] 97.9 F (36.6 C) (06/24 0700) Pulse Rate:  [43-172] 89 (06/24 1230) Resp:  [12-29] 18 (06/24 1230) BP: (73-125)/(46-81) 120/61 (06/24 1200) SpO2:  [98 %-100 %] 99 % (06/24 1230) Weight:  [112.7 kg] 112.7 kg (06/24 0500) Physical  Exam  Constitutional:  chronically ill appearing, min responsive but opens eyes Cardiovascular: Normal rate, regular rhythm and normal heart sounds.  Pulmonary/Chest: Effort normal and breath sounds normal. No respiratory distress.  has no wheezes.  Neck = supple, no nuchal rigidity Abdominal: Soft. Bowel sounds are normal.  exhibits no distension. There is no tenderness.  Lymphadenopathy: no cervical adenopathy. No axillary adenopathy Neurological: confused, + myoclonus Skin:    LABS: Results for orders placed or performed during the hospital encounter of 07/01/23 (from the past 48 hours)  Glucose, capillary     Status: None   Collection Time: 07/05/23  1:03 AM  Result Value Ref Range   Glucose-Capillary 78 70 - 99 mg/dL    Comment: Glucose reference range applies only to samples taken after fasting for at least 8 hours.  CBC with Differential/Platelet  Status: Abnormal   Collection Time: 07/05/23  2:53 AM  Result Value Ref Range   WBC 15.7 (H) 4.0 - 10.5 K/uL   RBC 3.22 (L) 3.87 - 5.11 MIL/uL   Hemoglobin 8.6 (L) 12.0 - 15.0 g/dL   HCT 73.9 (L) 63.9 - 53.9 %   MCV 80.7 80.0 - 100.0 fL   MCH 26.7 26.0 - 34.0 pg   MCHC 33.1 30.0 - 36.0 g/dL   RDW 81.5 (H) 88.4 - 84.4 %   Platelets 503 (H) 150 - 400 K/uL   nRBC 0.0 0.0 - 0.2 %   Neutrophils Relative % 88 %   Neutro Abs 13.7 (H) 1.7 - 7.7 K/uL   Lymphocytes Relative 7 %   Lymphs Abs 1.1 0.7 - 4.0 K/uL   Monocytes Relative 2 %   Monocytes Absolute 0.4 0.1 - 1.0 K/uL   Eosinophils Relative 2 %   Eosinophils Absolute 0.4 0.0 - 0.5 K/uL   Basophils Relative 0 %   Basophils Absolute 0.1 0.0 - 0.1 K/uL   Immature Granulocytes 1 %   Abs Immature Granulocytes 0.13 (H) 0.00 - 0.07 K/uL    Comment: Performed at [facility], [address] [place], [place] [address]  Basic metabolic panel     Status: Abnormal   Collection Time: 07/05/23  2:53 AM  Result Value Ref Range   Sodium 133 (L) 135 - 145 mmol/L   Potassium  3.8 3.5 - 5.1 mmol/L   Chloride 102 98 - 111 mmol/L   CO2 25 22 - 32 mmol/L   Glucose, Bld 76 70 - 99 mg/dL    Comment: Glucose reference range applies only to samples taken after fasting for at least 8 hours.   BUN 22 8 - 23 mg/dL   Creatinine, Ser 9.11 0.44 - 1.00 mg/dL   Calcium  7.4 (L) 8.9 - 10.3 mg/dL   GFR, Estimated >39 >39 mL/min    Comment: (NOTE) Calculated using the CKD-EPI Creatinine Equation (2021)    Anion gap 6 5 - 15    Comment: Performed at [facility], [address] [place], [place] [address]  Vitamin B12     Status: Abnormal   Collection Time: 07/05/23  2:18 PM  Result Value Ref Range   Vitamin B-12 1,412 (H) 180 - 914 pg/mL    Comment: (NOTE) This assay is not validated for testing neonatal or myeloproliferative syndrome specimens for Vitamin B12 levels. Performed at [facility], [address] [place], [place] [address]   Folate     Status: None   Collection Time: 07/05/23  2:18 PM  Result Value Ref Range   Folate 19.6 >5.9 ng/mL    Comment: Performed at [facility], [address] [place], [place] [address]  Glucose, capillary     Status: None   Collection Time: 07/05/23  8:39 PM  Result Value Ref Range   Glucose-Capillary 76 70 - 99 mg/dL    Comment: Glucose reference range applies only to samples taken after fasting for at least 8 hours.  Glucose, capillary     Status: None   Collection Time: 07/05/23 11:32 PM  Result Value Ref Range   Glucose-Capillary 82 70 - 99 mg/dL    Comment: Glucose reference range applies only to samples taken after fasting for at least 8 hours.  CBC with Differential     Status: Abnormal   Collection Time: 07/05/23 11:36 PM  Result Value Ref Range   WBC 11.2 (H)  4.0 - 10.5 K/uL   RBC 3.21 (L) 3.87 - 5.11 MIL/uL   Hemoglobin 8.7 (L) 12.0 - 15.0 g/dL   HCT 73.9 (L) 63.9 - 53.9 %   MCV 81.0 80.0 - 100.0 fL   MCH 27.1 26.0 - 34.0 pg   MCHC 33.5 30.0 - 36.0 g/dL   RDW 81.5 (H) 88.4 - 84.4 %    Platelets 492 (H) 150 - 400 K/uL   nRBC 0.0 0.0 - 0.2 %   Neutrophils Relative % 82 %   Neutro Abs 9.2 (H) 1.7 - 7.7 K/uL   Lymphocytes Relative 11 %   Lymphs Abs 1.2 0.7 - 4.0 K/uL   Monocytes Relative 3 %   Monocytes Absolute 0.3 0.1 - 1.0 K/uL   Eosinophils Relative 3 %   Eosinophils Absolute 0.3 0.0 - 0.5 K/uL   Basophils Relative 0 %   Basophils Absolute 0.0 0.0 - 0.1 K/uL   Immature Granulocytes 1 %   Abs Immature Granulocytes 0.09 (H) 0.00 - 0.07 K/uL    Comment: Performed at [facility], [address] [place], [place] [address]  Comprehensive metabolic panel     Status: Abnormal   Collection Time: 07/05/23 11:36 PM  Result Value Ref Range   Sodium 136 135 - 145 mmol/L   Potassium 3.1 (L) 3.5 - 5.1 mmol/L   Chloride 105 98 - 111 mmol/L   CO2 24 22 - 32 mmol/L   Glucose, Bld 90 70 - 99 mg/dL    Comment: Glucose reference range applies only to samples taken after fasting for at least 8 hours.   BUN 22 8 - 23 mg/dL   Creatinine, Ser 9.11 0.44 - 1.00 mg/dL   Calcium  7.4 (L) 8.9 - 10.3 mg/dL   Total Protein 4.3 (L) 6.5 - 8.1 g/dL   Albumin  <1.5 (L) 3.5 - 5.0 g/dL   AST 24 15 - 41 U/L   ALT 16 0 - 44 U/L   Alkaline Phosphatase 123 38 - 126 U/L   Total Bilirubin 0.3 0.0 - 1.2 mg/dL   GFR, Estimated >39 >39 mL/min    Comment: (NOTE) Calculated using the CKD-EPI Creatinine Equation (2021)    Anion gap 7 5 - 15    Comment: Performed at [facility], [address] [place], [place] [address]  Protime-INR     Status: Abnormal   Collection Time: 07/05/23 11:36 PM  Result Value Ref Range   Prothrombin Time 21.2 (H) 11.4 - 15.2 seconds   INR 1.8 (H) 0.8 - 1.2    Comment: (NOTE) INR goal varies based on device and disease states. Performed at [facility], [address] [place], [place] [address]   APTT     Status: Abnormal   Collection Time: 07/05/23 11:36 PM  Result Value Ref Range   aPTT 49 (H) 24 - 36 seconds    Comment:         IF BASELINE aPTT IS ELEVATED, SUGGEST PATIENT RISK ASSESSMENT BE USED TO DETERMINE APPROPRIATE ANTICOAGULANT THERAPY. Performed at [facility], [address] [place], [place] [address]   Procalcitonin     Status: None   Collection Time: 07/05/23 11:36 PM  Result Value Ref Range   Procalcitonin 0.71 ng/mL    Comment:        Interpretation: PCT > 0.5 ng/mL and <= 2 ng/mL: Systemic infection (sepsis) is possible, but other conditions are known to elevate PCT as well. (NOTE)  Sepsis PCT Algorithm           Lower Respiratory Tract                                      Infection PCT Algorithm    ----------------------------     ----------------------------         PCT < 0.25 ng/mL                PCT < 0.10 ng/mL          Strongly encourage             Strongly discourage   discontinuation of antibiotics    initiation of antibiotics    ----------------------------     -----------------------------       PCT 0.25 - 0.50 ng/mL            PCT 0.10 - 0.25 ng/mL               OR       >80% decrease in PCT            Discourage initiation of                                            antibiotics      Encourage discontinuation           of antibiotics    ----------------------------     -----------------------------         PCT >= 0.50 ng/mL              PCT 0.26 - 0.50 ng/mL                AND       <80% decrease in PCT             Encourage initiation of                                             antibiotics       Encourage continuation           of antibiotics    ----------------------------     -----------------------------        PCT >= 0.50 ng/mL                  PCT > 0.50 ng/mL               AND         increase in PCT                  Strongly encourage                                      initiation of antibiotics    Strongly encourage escalation           of antibiotics                                     -----------------------------  PCT <= 0.25 ng/mL                                                 OR                                        > 80% decrease in PCT                                      Discontinue / Do not initiate                                             antibiotics  Performed at [facility], [address] [place], [place] [address]   Lactic acid, plasma     Status: None   Collection Time: 07/05/23 11:38 PM  Result Value Ref Range   Lactic Acid, Venous 1.2 0.5 - 1.9 mmol/L    Comment: Performed at [facility], [address] [place], [place] [address]  Blood gas, arterial     Status: Abnormal   Collection Time: 07/05/23 11:52 PM  Result Value Ref Range   O2 Content 2.0 L/min   pH, Arterial 7.5 (H) 7.35 - 7.45   pCO2 arterial 33 32 - 48 mmHg   pO2, Arterial 92 83 - 108 mmHg   Bicarbonate 25.7 20.0 - 28.0 mmol/L   Acid-Base Excess 2.9 (H) 0.0 - 2.0 mmol/L   O2 Saturation 99.1 %   Patient temperature 37.0    Collection site RIGHT RADIAL    Allens test (pass/fail) PASS PASS    Comment: Performed at [facility], [address] [place], [place] [address]  Urinalysis, Complete w Microscopic -Urine, Catheterized; Indwelling urinary catheter     Status: Abnormal   Collection Time: 07/06/23 12:38 AM  Result Value Ref Range   Color, Urine YELLOW (A) YELLOW   APPearance HAZY (A) CLEAR   Specific Gravity, Urine 1.014 1.005 - 1.030   pH 6.0 5.0 - 8.0   Glucose, UA NEGATIVE NEGATIVE mg/dL   Hgb urine dipstick MODERATE (A) NEGATIVE   Bilirubin Urine NEGATIVE NEGATIVE   Ketones, ur NEGATIVE NEGATIVE mg/dL   Protein, ur 30 (A) NEGATIVE mg/dL   Nitrite NEGATIVE NEGATIVE   Leukocytes,Ua MODERATE (A) NEGATIVE   RBC / HPF >50 0 - 5 RBC/hpf   WBC, UA >50 0 - 5 WBC/hpf   Bacteria, UA RARE (A) NONE SEEN   Squamous Epithelial / HPF 0-5 0 - 5 /HPF   Mucus PRESENT    Non Squamous Epithelial PRESENT (A) NONE SEEN    Comment: Performed at [facility], [address] [place], [place] [address]  Culture, blood (x 2)     Status: None (Preliminary result)   Collection Time: 07/06/23  2:46 AM   Specimen: BLOOD  Result Value Ref Range   Specimen Description BLOOD BLOOD LEFT ARM    Special Requests      BOTTLES DRAWN AEROBIC AND ANAEROBIC Blood Culture adequate volume   Culture  Setup Time PENDING  Culture      NO GROWTH < 12 HOURS Performed at [facility], [address] [place], [place] [address]    Report Status PENDING   Culture, blood (x 2)     Status: None (Preliminary result)   Collection Time: 07/06/23  2:46 AM   Specimen: BLOOD  Result Value Ref Range   Specimen Description BLOOD BLOOD LEFT HAND    Special Requests      BOTTLES DRAWN AEROBIC AND ANAEROBIC Blood Culture adequate volume   Culture  Setup Time PENDING    Culture      NO GROWTH < 12 HOURS Performed at [facility], [address] [place], [place] [address]    Report Status PENDING   Lactic acid, plasma     Status: Abnormal   Collection Time: 07/06/23  2:46 AM  Result Value Ref Range   Lactic Acid, Venous 4.5 (HH) 0.5 - 1.9 mmol/L    Comment: CRITICAL RESULT CALLED TO, READ BACK BY AND VERIFIED WITH KRISTY SNEAD @ 07/06/23 0312 AB Performed at [facility], [address] [place], [place] [address]   CBC with Differential/Platelet     Status: Abnormal   Collection Time: 07/06/23 10:17 AM  Result Value Ref Range   WBC 10.7 (H) 4.0 - 10.5 K/uL   RBC 3.37 (L) 3.87 - 5.11 MIL/uL   Hemoglobin 8.9 (L) 12.0 - 15.0 g/dL   HCT 72.9 (L) 63.9 - 53.9 %   MCV 80.1 80.0 - 100.0 fL   MCH 26.4 26.0 - 34.0 pg   MCHC 33.0 30.0 - 36.0 g/dL   RDW 81.3 (H) 88.4 - 84.4 %   Platelets 460 (H) 150 - 400 K/uL   nRBC 0.0 0.0 - 0.2 %   Neutrophils Relative % 82 %   Neutro Abs 8.8 (H) 1.7 - 7.7 K/uL   Lymphocytes Relative 10 %   Lymphs Abs 1.0 0.7 - 4.0 K/uL   Monocytes Relative 4 %   Monocytes Absolute 0.4 0.1 - 1.0 K/uL    Eosinophils Relative 3 %   Eosinophils Absolute 0.3 0.0 - 0.5 K/uL   Basophils Relative 0 %   Basophils Absolute 0.0 0.0 - 0.1 K/uL   Immature Granulocytes 1 %   Abs Immature Granulocytes 0.08 (H) 0.00 - 0.07 K/uL    Comment: Performed at [facility], [address] [place], [place] [address]  Comprehensive metabolic panel     Status: Abnormal   Collection Time: 07/06/23 10:17 AM  Result Value Ref Range   Sodium 138 135 - 145 mmol/L   Potassium 2.8 (L) 3.5 - 5.1 mmol/L   Chloride 107 98 - 111 mmol/L   CO2 25 22 - 32 mmol/L   Glucose, Bld 75 70 - 99 mg/dL    Comment: Glucose reference range applies only to samples taken after fasting for at least 8 hours.   BUN 19 8 - 23 mg/dL   Creatinine, Ser 9.16 0.44 - 1.00 mg/dL   Calcium  7.4 (L) 8.9 - 10.3 mg/dL   Total Protein 4.2 (L) 6.5 - 8.1 g/dL   Albumin  <1.5 (L) 3.5 - 5.0 g/dL   AST 23 15 - 41 U/L   ALT 14 0 - 44 U/L   Alkaline Phosphatase 122 38 - 126 U/L   Total Bilirubin 0.6 0.0 - 1.2 mg/dL   GFR, Estimated >39 >39 mL/min    Comment: (NOTE) Calculated using the CKD-EPI Creatinine Equation (2021)    Anion gap 6  5 - 15    Comment: Performed at [facility], [address] [place], [place] [address]   No components found for: ESR, C REACTIVE PROTEIN MICRO: Recent Results (from the past 720 hours)  Blood culture (routine x 2)     Status: None   Collection Time: 07/01/23  4:45 PM   Specimen: BLOOD  Result Value Ref Range Status   Specimen Description BLOOD BLOOD RIGHT HAND  Final   Special Requests   Final    BOTTLES DRAWN AEROBIC AND ANAEROBIC Blood Culture results may not be optimal due to an inadequate volume of blood received in culture bottles   Culture   Final    NO GROWTH 5 DAYS Performed at [facility], [address] [place], [place] [address]    Report Status 07/06/2023 FINAL  Final  Blood culture (routine x 2)     Status: None   Collection Time: 07/01/23  4:45 PM   Specimen:  BLOOD  Result Value Ref Range Status   Specimen Description BLOOD BLOOD LEFT ARM  Final   Special Requests   Final    BOTTLES DRAWN AEROBIC ONLY Blood Culture adequate volume   Culture   Final    NO GROWTH 5 DAYS Performed at [facility], [address], [place], [place] [address]    Report Status 07/06/2023 FINAL  Final  Culture, Urine (Do not remove urinary catheter, catheter placed by urology or difficult to place)     Status: Abnormal   Collection Time: 07/01/23  4:45 PM   Specimen: Urine, Catheterized  Result Value Ref Range Status   Specimen Description   Final    URINE, CATHETERIZED Performed at [facility], [address] [place], [place] [address]    Special Requests   Final    NONE Performed at [facility], [address] [place], [place] [address]    Culture (A)  Final    >=100,000 COLONIES/mL ESCHERICHIA COLI >=100,000 COLONIES/mL SERRATIA MARCESCENS >=100,000 COLONIES/mL PSEUDOMONAS AERUGINOSA    Report Status 07/05/2023 FINAL  Final   Organism ID, Bacteria ESCHERICHIA COLI (A)  Final   Organism ID, Bacteria SERRATIA MARCESCENS (A)  Final   Organism ID, Bacteria PSEUDOMONAS AERUGINOSA (A)  Final      Susceptibility   Escherichia coli - MIC*    AMPICILLIN 16 INTERMEDIATE Intermediate     CEFAZOLIN  <=4 SENSITIVE Sensitive     CEFEPIME  <=0.12 SENSITIVE Sensitive     CEFTRIAXONE  <=0.25 SENSITIVE Sensitive     CIPROFLOXACIN >=4 RESISTANT Resistant     GENTAMICIN <=1 SENSITIVE Sensitive     IMIPENEM <=0.25 SENSITIVE Sensitive     NITROFURANTOIN <=16 SENSITIVE Sensitive     TRIMETH/SULFA <=20 SENSITIVE Sensitive     AMPICILLIN/SULBACTAM 4 SENSITIVE Sensitive     PIP/TAZO <=4 SENSITIVE Sensitive ug/mL    * >=100,000 COLONIES/mL ESCHERICHIA COLI   Pseudomonas aeruginosa - MIC*    CEFTAZIDIME 2 SENSITIVE Sensitive     CIPROFLOXACIN <=0.25 SENSITIVE Sensitive     GENTAMICIN <=1 SENSITIVE Sensitive     IMIPENEM <=0.25 SENSITIVE  Sensitive     PIP/TAZO <=4 SENSITIVE Sensitive ug/mL    CEFEPIME  2 SENSITIVE Sensitive     * >=100,000 COLONIES/mL PSEUDOMONAS AERUGINOSA   Serratia marcescens - MIC*    CEFEPIME  <=0.12 SENSITIVE Sensitive     CEFTRIAXONE  <=0.25 SENSITIVE Sensitive     CIPROFLOXACIN <=0.25 SENSITIVE Sensitive     GENTAMICIN <=1 SENSITIVE Sensitive     NITROFURANTOIN  128 RESISTANT Resistant     TRIMETH/SULFA <=20 SENSITIVE Sensitive     * >=100,000 COLONIES/mL SERRATIA MARCESCENS  Aerobic/Anaerobic Culture w Gram Stain (surgical/deep wound)     Status: None   Collection Time: 07/02/23 12:38 AM   Specimen: Wound  Result Value Ref Range Status   Specimen Description   Final    WOUND Performed at [facility], [address] [place], [place] [address]    Special Requests   Final    NONE Performed at [facility], [address] [place], [place] [address]    Gram Stain   Final    FEW WBC PRESENT, PREDOMINANTLY PMN NO ORGANISMS SEEN    Culture   Final    RARE PSEUDOMONAS AERUGINOSA MODERATE CORYNEBACTERIUM STRIATUM Standardized susceptibility testing for this organism is not available. RARE BACTEROIDES FRAGILIS BETA LACTAMASE POSITIVE Performed at [facility], [address] [place], [place] [address]    Report Status 07/05/2023 FINAL  Final   Organism ID, Bacteria PSEUDOMONAS AERUGINOSA  Final      Susceptibility   Pseudomonas aeruginosa - MIC*    CEFTAZIDIME 2 SENSITIVE Sensitive     CIPROFLOXACIN <=0.25 SENSITIVE Sensitive     GENTAMICIN <=1 SENSITIVE Sensitive     IMIPENEM <=0.25 SENSITIVE Sensitive     PIP/TAZO <=4 SENSITIVE Sensitive ug/mL    CEFEPIME  2 SENSITIVE Sensitive     * RARE PSEUDOMONAS AERUGINOSA  Respiratory (~20 pathogens) panel by PCR     Status: None   Collection Time: 07/02/23  2:47 PM   Specimen: Nasopharyngeal Swab; Respiratory  Result Value Ref Range Status   Adenovirus NOT DETECTED NOT DETECTED Final   Coronavirus 229E NOT DETECTED  NOT DETECTED Final    Comment: (NOTE) The Coronavirus on the Respiratory Panel, DOES NOT test for the novel  Coronavirus (2019 nCoV)    Coronavirus HKU1 NOT DETECTED NOT DETECTED Final   Coronavirus NL63 NOT DETECTED NOT DETECTED Final   Coronavirus OC43 NOT DETECTED NOT DETECTED Final   Metapneumovirus NOT DETECTED NOT DETECTED Final   Rhinovirus / Enterovirus NOT DETECTED NOT DETECTED Final   Influenza A NOT DETECTED NOT DETECTED Final   Influenza B NOT DETECTED NOT DETECTED Final   Parainfluenza Virus 1 NOT DETECTED NOT DETECTED Final   Parainfluenza Virus 2 NOT DETECTED NOT DETECTED Final   Parainfluenza Virus 3 NOT DETECTED NOT DETECTED Final   Parainfluenza Virus 4 NOT DETECTED NOT DETECTED Final   Respiratory Syncytial Virus NOT DETECTED NOT DETECTED Final   Bordetella pertussis NOT DETECTED NOT DETECTED Final   Bordetella Parapertussis NOT DETECTED NOT DETECTED Final   Chlamydophila pneumoniae NOT DETECTED NOT DETECTED Final   Mycoplasma pneumoniae NOT DETECTED NOT DETECTED Final    Comment: Performed at [facility], [address] [place], [place] [address]  Culture, blood (x 2)     Status: None (Preliminary result)   Collection Time: 07/06/23  2:46 AM   Specimen: BLOOD  Result Value Ref Range Status   Specimen Description BLOOD BLOOD LEFT ARM  Final   Special Requests   Final    BOTTLES DRAWN AEROBIC AND ANAEROBIC Blood Culture adequate volume   Culture  Setup Time PENDING  Incomplete   Culture   Final    NO GROWTH < 12 HOURS Performed at [facility], [address] [place], [place] [address]    Report Status PENDING  Incomplete  Culture, blood (x 2)     Status: None (Preliminary  result)   Collection Time: 07/06/23  2:46 AM   Specimen: BLOOD  Result Value Ref Range Status   Specimen Description BLOOD BLOOD LEFT HAND  Final   Special Requests   Final    BOTTLES DRAWN AEROBIC AND ANAEROBIC Blood Culture adequate volume   Culture  Setup Time  PENDING  Incomplete   Culture   Final    NO GROWTH < 12 HOURS Performed at [facility], [address], [place], [place] [address]    Report Status PENDING  Incomplete    IMAGING: CT HEAD WO CONTRAST ( ) Result Date: 07/06/2023 CLINICAL DATA:  Initial evaluation for acute meningitis/CNS infection suspected. EXAM: CT HEAD WITHOUT CONTRAST TECHNIQUE: Contiguous axial images were obtained from the base of the skull through the vertex without intravenous contrast. RADIATION DOSE REDUCTION: This exam was performed according to the departmental dose-optimization program which includes automated exposure control, adjustment of the mA and/or kV according to patient size and/or use of iterative reconstruction technique. COMPARISON:  Prior CT from 07/01/2023. FINDINGS: Brain: Cerebral volume within normal limits. Mild chronic microvascular ischemic disease involving the supratentorial cerebral Cuffe matter noted. No acute intracranial hemorrhage. No acute large vessel territory infarct. No mass lesion or midline shift. Ventricles normal size without hydrocephalus. No extra-axial fluid collection. Vascular: No abnormal hyperdense vessel. Calcified atherosclerosis of positive up the skull base. Skull: Scalp soft tissues within normal limits.  Calvarium intact. Sinuses/Orbits: Globes and orbital soft tissues within normal limits. Paranasal sinuses and mastoid air cells are largely clear. Other: None. IMPRESSION: 1. No acute intracranial abnormality. 2. Mild chronic microvascular ischemic disease. Electronically Signed   By: Morene Hoard M.D.   On: 07/06/2023 04:09   DG Chest Port 1 View Result Date: 07/06/2023 CLINICAL DATA:  Possible sepsis EXAM: PORTABLE CHEST 1 VIEW COMPARISON:  03/17/2023 FINDINGS: Cardiac shadow is enlarged but stable. Aortic calcifications are seen. Mild central vascular congestion is noted. Left basilar density is noted consistent with atelectasis. No bony abnormality is  noted. IMPRESSION: Left basilar atelectasis. Mild vascular congestion without edema. Electronically Signed   By: Oneil Devonshire M.D.   On: 07/06/2023 00:21   EEG adult Result Date: 07/05/2023 Shelton Arlin KIDD, MD     07/05/2023  3:30 PM Patient Name: Wendy Sanchez MRN: [id number] Epilepsy Attending: Arlin KIDD Shelton Referring Physician/Provider: Dorinda Drue ONEIDA, MD Date: 07/05/2023 Duration: 24.36 mins Patient history: 73yo f with ams. EEG to evaluate for seizure Level of alertness: Awake AEDs during EEG study: None Technical aspects: This EEG study was done with scalp electrodes positioned according to the 10-20 International system of electrode placement. Electrical activity was reviewed with band pass filter of 1-70Hz , sensitivity of 7 uV/mm, display speed of 107mm/sec with a 60Hz  notched filter applied as appropriate. EEG data were recorded continuously and digitally stored.  Video monitoring was available and reviewed as appropriate. Description: EEG showed continuous generalized polymorphic 3 to 6 Hz theta-delta slowing. Generalized periodic discharges with triphasic morphology at  0.5-1 Hz were also noted. Patient was noted to have subtle whole body twitch at 1339.  Concomitant EEG before, during and after the event did not show any EEG changes to suggest seizure.  Hyperventilation and photic stimulation were not performed.   ABNORMALITY - Periodic discharges with triphasic morphology, generalized ( GPDs) - Continuous slow, generalized IMPRESSION: This study is suggestive of moderate diffuse encephalopathy typically related to toxic-metabolic causes like cefepime  toxicity. No seizures or epileptiform discharges were seen throughout the recording. Patient was noted  to have subtle whole body twitch at 1339 without concomitant EEG change. This was a NON epileptic event. Arlin MALVA Krebs   EEG adult Result Date: 07/05/2023 Krebs Arlin MALVA, MD     07/05/2023  8:19 AM Patient Name: RECIE CIRRINCIONE MRN: [id number]  Epilepsy Attending: Arlin MALVA Krebs Referring Physician/Provider: Lawence Madison LABOR, MD Date: 07/02/2023 Duration: 21.28 mins Patient history: 73yo F with syncope. EEG to evaluate for seizure Level of alertness: Awake AEDs during EEG study: None Technical aspects: This EEG study was done with scalp electrodes positioned according to the 10-20 International system of electrode placement. Electrical activity was reviewed with band pass filter of 1-70Hz , sensitivity of 7 uV/mm, display speed of 101mm/sec with a 60Hz  notched filter applied as appropriate. EEG data were recorded continuously and digitally stored.  Video monitoring was available and reviewed as appropriate. Description: The posterior dominant rhythm consists of 8-9 Hz activity of moderate voltage (25-35 uV) seen predominantly in posterior head regions, symmetric and reactive to eye opening and eye closing. EEG showed intermittent generalized 3 to 6 Hz theta-delta slowing. Hyperventilation and photic stimulation were not performed.   ABNORMALITY - Intermittent slow, generalized IMPRESSION: This study is suggestive of mild diffuse encephalopathy. No seizures or epileptiform discharges were seen throughout the recording. Arlin MALVA Krebs   ECHOCARDIOGRAM COMPLETE Result Date: 07/02/2023    ECHOCARDIOGRAM REPORT   Patient Name:   NIYANA CHESBRO Maslin Date of Exam: 07/02/2023 Medical Rec #:  [id number]       Height:       68.0 in Accession #:    [id number]      Weight:       274.3 lb Date of Birth:  12-12-1950       BSA:          2.337 m Patient Age:    73 years        BP:           99/49 mmHg Patient Gender: F               HR:           110 bpm. Exam Location:  [facility] Procedure: 2D Echo, Cardiac Doppler and Color Doppler (Both Spectral and Color            Flow Doppler were utilized during procedure). Indications:     Syncope R55  History:         Patient has prior history of Echocardiogram examinations, most                  recent 03/09/2023. CHF, COPD; Risk  Factors:Hypertension.  Sonographer:     Christopher Furnace Referring Phys:  [id number] JAN A MANSY Diagnosing Phys: Dwayne D Callwood MD IMPRESSIONS  1. Left ventricular ejection fraction, by estimation, is 45 to 50%. The left ventricle has mildly decreased function. The left ventricle demonstrates global hypokinesis. The left ventricular internal cavity size was mildly dilated. There is mild concentric left ventricular hypertrophy. Left ventricular diastolic parameters are consistent with Grade I diastolic dysfunction (impaired relaxation).  2. Right ventricular systolic function is normal. The right ventricular size is normal.  3. A small pericardial effusion is present. The pericardial effusion is posterior to the left ventricle.  4. The mitral valve is grossly normal. Trivial mitral valve regurgitation.  5. The aortic valve is grossly normal. Aortic valve regurgitation is not visualized. FINDINGS  Left Ventricle: Left ventricular ejection fraction, by estimation, is 45 to 50%. The left ventricle  has mildly decreased function. The left ventricle demonstrates global hypokinesis. Strain was performed and the global longitudinal strain is indeterminate. Global longitudinal strain performed but not reported based on interpreter judgement due to suboptimal tracking. The left ventricular internal cavity size was mildly dilated. There is mild concentric left ventricular hypertrophy. Left ventricular diastolic parameters are consistent with Grade I diastolic dysfunction (impaired relaxation). Right Ventricle: The right ventricular size is normal. No increase in right ventricular wall thickness. Right ventricular systolic function is normal. Left Atrium: Left atrial size was normal in size. Right Atrium: Right atrial size was normal in size. Pericardium: A small pericardial effusion is present. The pericardial effusion is posterior to the left ventricle. Mitral Valve: The mitral valve is grossly normal. Trivial mitral valve  regurgitation. Tricuspid Valve: The tricuspid valve is normal in structure. Tricuspid valve regurgitation is trivial. Aortic Valve: The aortic valve is grossly normal. Aortic valve regurgitation is not visualized. Aortic valve mean gradient measures 13.5 mmHg. Aortic valve peak gradient measures 20.3 mmHg. Aortic valve area, by VTI measures 2.29 cm. Pulmonic Valve: The pulmonic valve was normal in structure. Pulmonic valve regurgitation is not visualized. Aorta: The ascending aorta was not well visualized. IAS/Shunts: No atrial level shunt detected by color flow Doppler. Additional Comments: 3D was performed not requiring image post processing on an independent workstation and was indeterminate.  LEFT VENTRICLE PLAX 2D LVIDd:         5.20 cm   Diastology LVIDs:         3.20 cm   LV e' medial:    5.66 cm/s LV PW:         1.40 cm   LV E/e' medial:  11.9 LV IVS:        1.20 cm   LV e' lateral:   7.72 cm/s LVOT diam:     2.30 cm   LV E/e' lateral: 8.7 LV SV:         81 LV SV Index:   35 LVOT Area:     4.15 cm  RIGHT VENTRICLE RV Basal diam:  4.30 cm RV Mid diam:    2.80 cm RV S prime:     35.10 cm/s TAPSE (M-mode): 3.1 cm LEFT ATRIUM             Index        RIGHT ATRIUM           Index LA diam:        2.10 cm 0.90 cm/m   RA Area:     18.80 cm LA Vol (A2C):   28.6 ml 12.24 ml/m  RA Volume:   56.10 ml  24.00 ml/m LA Vol (A4C):   37.4 ml 16.00 ml/m LA Biplane Vol: 34.8 ml 14.89 ml/m  AORTIC VALVE AV Area (Vmax):    1.88 cm AV Area (Vmean):   1.82 cm AV Area (VTI):     2.29 cm AV Vmax:           225.50 cm/s AV Vmean:          176.500 cm/s AV VTI:            0.356 m AV Peak Grad:      20.3 mmHg AV Mean Grad:      13.5 mmHg LVOT Vmax:         102.00 cm/s LVOT Vmean:        77.300 cm/s LVOT VTI:          0.196 m LVOT/AV VTI  ratio: 0.55  AORTA Ao Root diam: 3.70 cm MITRAL VALVE                TRICUSPID VALVE MV Area (PHT): 4.74 cm     TR Peak grad:   33.4 mmHg MV Decel Time: 160 msec     TR Vmax:        289.00 cm/s  MV E velocity: 67.10 cm/s MV A velocity: 108.00 cm/s  SHUNTS MV E/A ratio:  0.62         Systemic VTI:  0.20 m                             Systemic Diam: 2.30 cm Cara JONETTA Lovelace MD Electronically signed by Cara JONETTA Lovelace MD Signature Date/Time: 07/02/2023/6:25:24 PM    Final    US  Carotid Bilateral Result Date: 07/02/2023 CLINICAL DATA:  73 year old female with headache and seizure like activity. EXAM: BILATERAL CAROTID DUPLEX ULTRASOUND TECHNIQUE: Elnor scale imaging, color Doppler and duplex ultrasound were performed of bilateral carotid and vertebral arteries in the neck. COMPARISON:  Head CT yesterday.  Neck MRI 11/21/2018. FINDINGS: Criteria: Quantification of carotid stenosis is based on velocity parameters that correlate the residual internal carotid diameter with NASCET-based stenosis levels, using the diameter of the distal internal carotid lumen as the denominator for stenosis measurement. The following velocity measurements were obtained: RIGHT ICA: 35 cm/sec CCA: 60 cm/sec SYSTOLIC ICA/CCA RATIO:  0.8 ECA: 72 cm/sec LEFT ICA: 77 cm/sec CCA: 45 cm/sec SYSTOLIC ICA/CCA RATIO:  1.7 ECA: 70 cm/sec RIGHT CAROTID ARTERY: Mild atherosclerosis at the right carotid bifurcation. RIGHT VERTEBRAL ARTERY:  Stable with antegrade flow detected. LEFT CAROTID ARTERY: Mild atherosclerosis at the left carotid bifurcation. LEFT VERTEBRAL ARTERY:  Patent with antegrade flow detected. IMPRESSION: Doppler Ultrasound indicates no hemodynamically significant carotid stenosis. Electronically Signed   By: VEAR Hurst M.D.   On: 07/02/2023 07:01   CT Head Wo Contrast Result Date: 07/01/2023 CLINICAL DATA:  Possible UTI, presenting with hypotension. EXAM: CT HEAD WITHOUT CONTRAST TECHNIQUE: Contiguous axial images were obtained from the base of the skull through the vertex without intravenous contrast. RADIATION DOSE REDUCTION: This exam was performed according to the departmental dose-optimization program which includes  automated exposure control, adjustment of the mA and/or kV according to patient size and/or use of iterative reconstruction technique. COMPARISON:  August 01, 2018 FINDINGS: Brain: There is generalized cerebral atrophy with widening of the extra-axial spaces and ventricular dilatation. There are areas of decreased attenuation within the Cragin matter tracts of the supratentorial brain, consistent with microvascular disease changes. A partially empty sella is seen. Vascular: Marked severity bilateral cavernous carotid artery calcification is noted. Skull: Normal. Negative for fracture or focal lesion. Sinuses/Orbits: No acute finding. Other: None. IMPRESSION: 1. Generalized cerebral atrophy and microvascular disease changes of the supratentorial brain. 2. No acute intracranial abnormality. Electronically Signed   By: Suzen Dials M.D.   On: 07/01/2023 19:46    Assessment:   Wendy Sanchez is a 74 y.o. female chronically quite ill woman who is bedbound and has contractures and a colostomy as well as a Foley admitted June 19 with acute onset of weakness.  She has a history of asthma osteoarthritis giant cell arteritis CHF COPD DVT PE A-fib on Eliquis .  She has a chronic Foley and it was noted to have sediment in it.  She also has a sacral wound that was tunneling deeper.  In the ED  she was hypotensive but alert.  She was afebrile.  She had had a Hakanson count of 12 normal renal function lactate of 1.7 anemia with hemoglobin of 9.3 elevated platelets.  Viral respiratory panel was negative.  Urinalysis showed 21-50 Mecum cells.  Imaging initially showed CT of the head generalized cerebral atrophy but negative otherwise.  She was started on cefepime  and vancomycin  June 19.  She then had what appeared to be either a syncopal episode or seizure in the ED. She has been seen by neurology and is undergoing a lumbar puncture.  She had carotid Dopplers done At baseline she is bedbound has chronic ankle contractures  plantarflexion and has a colostomy as well as a Foley catheter.  She was at [employer] before coming home where her sister cares for her  Microdata reviewed and urine culture from her Foley catheter grew E. coli, Serratia and Pseudomonas.  These were all sensitive to cefepime  which she is continued on.  I suspect the neuro changes are cefepime  toxicity. Doubt meningitis but LP cannot be done. Has received 6   Recommendations For now continue meropenem , vanco and acyclovir pending MRI brain as suggested by neuro. LP cannot be done.  If neg MRI then change to ceftazidime for the Urine and wound culture Messaged with neurology.    Thank you very much for allowing me to participate in the care of this patient. Please call with questions.   Alm SQUIBB. Epifanio, MD

## 2023-07-06 NOTE — Progress Notes (Signed)
 Patient arrived with nursing staff under Rapid response. Patient had a heart rate of 170s, Blood pressure 86/74 and on 2L/[place]. Patient is only responsive to physical stimulation with unclear moaning. Arrived with glasses and phone at bedside. 2C RN Bernardino stated that he would call family to make them aware. Dr. Cleatus at bedside to assess and place orders. Will continue to monitor.

## 2023-07-06 NOTE — Progress Notes (Signed)
 PHARMACY CONSULT NOTE - FOLLOW UP  Pharmacy Consult for Electrolyte Monitoring and Replacement   Recent Labs: Potassium (mmol/L)  Date Value  07/06/2023 2.8 (L)   Magnesium  (mg/dL)  Date Value  93/79/7974 1.9   Calcium  (mg/dL)  Date Value  93/75/7974 7.4 (L)   Albumin  (g/dL)  Date Value  93/75/7974 <1.5 (L)  02/25/2017 3.7   Phosphorus (mg/dL)  Date Value  96/84/7974 3.7   Sodium (mmol/L)  Date Value  07/06/2023 138  04/11/2020 140    Assessment:  73 y.o. African-American female with medical history significant for asthma, osteoarthritis, giant cell arthritis, combined systolic and diastolic CHF, COPD, DVT, PE and atrial fibrillation on Eliquis , who presented to the emergency room with acute onset of increased weakness. Pharmacy is asked to follow and replace electrolytes while in CCU  Diuretics: torsemide  10 mg po once daily  Goal of Therapy:  Electrolytes WNL  Plan:  ---10 mEq IV KCl x 4 ---recheck electrolytes in am  Adriana JONETTA Bolster ,PharmD Clinical Pharmacist 07/06/2023 12:25 PM

## 2023-07-06 NOTE — Progress Notes (Signed)
 Progress Note   Patient: Wendy Sanchez FMW:[id number] DOB: 11/15/1950 DOA: 07/01/2023     5 DOS: the patient was seen and examined on 07/06/2023     Brief hospital course: From HPI Wendy Sanchez is a 73 y.o. African-American female with medical history significant for asthma, osteoarthritis, giant cell arthritis, combined systolic and diastolic CHF, COPD, DVT, PE and atrial fibrillation on Eliquis , who presented to the emergency room with acute onset of increased weakness.  She was noted to have sediment in her Foley catheter that was changed yesterday however that continued today.  There was a concern about a sacral wound that felt to be tunneling deeper.  The patient had a brief syncopal episode in the ER when her family noted that she was mildly shaking and her eyes rolled back.  She did not appear to to be postictal when she was seen by the EDP.  No reported fever or chills.  No reported nausea or vomiting or abdominal pain.  No reported chest pain or palpitations.  No cough or wheezing or dyspnea.  No bleeding diathesis.  The patient has been getting twice a week dressing change for her sacral decubitus ulcer.   ED Course: When she came to the ER, BP was 89/70 and later on 108/63 with a heart rate of 52 and later 101 and respiratory rate of 17 and later 32.  BMP showed calcium  of 7.6 and was otherwise unremarkable.  CBC showed leukocytosis of 12.1.  Platelets were 588.  Hemoglobin and hematocrit were 10.2 and 31.5 above previous levels.     Other detailed hospital course as outlined below  Assessment and Plan: Sepsis due to gram-negative UTI (HCC) - Sepsis manifested by leukocytosis, tachycardia and tachypnea. Patient received sepsis fluid in the emergency room Sepsis also compounded by decubitus sacral ulcer Wound care on board; we appreciate input Culture results showing Pseudomonas in the wound, urine cultures growing E. coli, Serratia as well as Pseudomonas Infectious disease on  board and case discussed extensively   Acute metabolic encephalopathy rule out possible bacterial meningitis or encephalitis Noted to have some tremors involving the right face and legs as well as bilateral upper extremities EEG obtained that did not show any epileptiform waves Patient initiated on Keppra and neurology was consulted Continue to monitor neurochecks closely Infectious disease consulted and case discussed LP was attempted by IR today however unsuccessful Currently patient on vancomycin , meropenem  and acyclovir According to ID pharmacist meropenem  will also cover Listeria (no need for ampicillin) Cefepime  have been discontinued Follow-up on MRI of the brain with and without contrast and if negative for meningitis we will de-escalate antibiotics   Syncope likely secondary to hypotension Continue neurochecks closely Echo showed EF 45 to 50% with grade 1 diastolic dysfunction Patient not fluid overloaded Continue management for sepsis as above   Hypokalemia Continue repletion and monitoring   Hypotension-improved This is likely in the setting of severe sepsis Continue midodrine    Chronic obstructive pulmonary disease (COPD) (HCC) - Given her somnolence we are checking ABG level. Continue her as needed DuoNebs.   Peripheral neuropathy Continue to hold Neurontin  given encephalopathy   GERD without esophagitis Continue PPI therapy   Paroxysmal atrial fibrillation (HCC) Eliquis  was held for planned LP today Noted to have several runs of SVT requiring minimal dose of metoprolol  for control     DVT prophylaxis: Was on Eliquis  however given plan.  Patient placed on scd   Advanced Care Planning:  Code Status: The patient  is DNR only.   Family Communication: Discussed with patient's family at bedside   Disposition Plan: Back to previous home environment   Consults called: Neurology.  Infectious disease   Subjective:  Patient's mental status worsened again  overnight and subsequently transferred to the ICU for closer monitoring LP attempted today however events unsuccessful due to body habitus Unable to obtain subjective information as patient is altered   Physical Exam:  GENERAL:  73 y.o.-year-old patient lying in the bed with no acute distress.  EYES: Pupils equal, round, reactive to light  HEENT: Head atraumatic, normocephalic.  NECK:  Supple, no jugular venous distention. No thyroid  enlargement, no tenderness.  LUNGS: Normal breath sounds bilaterally, no wheezing,  CARDIOVASCULAR: Regular rate and rhythm, S1, S2 normal.  ABDOMEN: Soft, nondistended, nontender. Bowel sounds present.  EXTREMITIES: No pedal edema, cyanosis, or clubbing.  NEUROLOGIC: Cranial nerves II through XII are intact.  Muscle strength decreased in bilateral lower extremities gait not checked.  SKIN: Deep stage III-IV sacral decubitus ulcer with clean granulation tissue and Persico eschar.  No other obvious rash, lesion, or ulcer.    Data Reviewed     Latest Ref Rng & Units 07/06/2023   10:17 AM 07/05/2023   11:36 PM 07/05/2023    2:53 AM  CBC  WBC 4.0 - 10.5 K/uL 10.7  11.2  15.7   Hemoglobin 12.0 - 15.0 g/dL 8.9  8.7  8.6   Hematocrit 36.0 - 46.0 % 27.0  26.0  26.0   Platelets 150 - 400 K/uL 460  492  503        Latest Ref Rng & Units 07/06/2023   10:17 AM 07/05/2023   11:36 PM 07/05/2023    2:53 AM  BMP  Glucose 70 - 99 mg/dL 75  90  76   BUN 8 - 23 mg/dL 19  22  22    Creatinine 0.44 - 1.00 mg/dL 9.16  9.11  9.11   Sodium 135 - 145 mmol/L 138  136  133   Potassium 3.5 - 5.1 mmol/L 2.8  3.1  3.8   Chloride 98 - 111 mmol/L 107  105  102   CO2 22 - 32 mmol/L 25  24  25    Calcium  8.9 - 10.3 mg/dL 7.4  7.4  7.4     Vitals:   07/06/23 1216 07/06/23 1229 07/06/23 1230 07/06/23 1300  BP:    (!) 106/57  Pulse: (!) 172 (!) 154 89 85  Resp: 16 16 18 16   Temp:      TempSrc:      SpO2:  98% 99% 100%  Weight:      Height:          Latest Ref Rng & Units  07/06/2023   10:17 AM 07/05/2023   11:36 PM 07/05/2023    2:53 AM  CBC  WBC 4.0 - 10.5 K/uL 10.7  11.2  15.7   Hemoglobin 12.0 - 15.0 g/dL 8.9  8.7  8.6   Hematocrit 36.0 - 46.0 % 27.0  26.0  26.0   Platelets 150 - 400 K/uL 460  492  503        Latest Ref Rng & Units 07/06/2023   10:17 AM 07/05/2023   11:36 PM 07/05/2023    2:53 AM  BMP  Glucose 70 - 99 mg/dL 75  90  76   BUN 8 - 23 mg/dL 19  22  22    Creatinine 0.44 - 1.00 mg/dL 9.16  9.11  9.11  Sodium 135 - 145 mmol/L 138  136  133   Potassium 3.5 - 5.1 mmol/L 2.8  3.1  3.8   Chloride 98 - 111 mmol/L 107  105  102   CO2 22 - 32 mmol/L 25  24  25    Calcium  8.9 - 10.3 mg/dL 7.4  7.4  7.4      Author: Drue ONEIDA Potter, MD 07/06/2023 2:41 PM  For on call review [email or web address].

## 2023-07-06 NOTE — Significant Event (Signed)
 Rapid Response Event Note   Reason for Call :  HR 170s Initial Focused Assessment:  Patient responsive only to Physical stimulation. PTS HR 170s BP  86/74 O2 100% on 2L Dr Cleatus at bedside. PTS HR fluctuating 100s-170s. PT to transfer to [facility]. Patient became more alert on transfer over to stepdown unit.    Plan of Care:  -Fluids -EKG -Labs -CT   Event Summary:   MD Notified:Hazel [facility] MD Call Time:2320 Arrival [phone] End Upfz:[id number]  Lesley LOISE Shams, RN

## 2023-07-07 ENCOUNTER — Other Ambulatory Visit: Payer: Self-pay

## 2023-07-07 DIAGNOSIS — G629 Polyneuropathy, unspecified: Secondary | ICD-10-CM

## 2023-07-07 DIAGNOSIS — E272 Addisonian crisis: Secondary | ICD-10-CM | POA: Diagnosis not present

## 2023-07-07 DIAGNOSIS — D75838 Other thrombocytosis: Secondary | ICD-10-CM | POA: Insufficient documentation

## 2023-07-07 DIAGNOSIS — A419 Sepsis, unspecified organism: Secondary | ICD-10-CM | POA: Diagnosis not present

## 2023-07-07 DIAGNOSIS — G9340 Encephalopathy, unspecified: Secondary | ICD-10-CM | POA: Diagnosis not present

## 2023-07-07 DIAGNOSIS — N390 Urinary tract infection, site not specified: Secondary | ICD-10-CM | POA: Diagnosis not present

## 2023-07-07 DIAGNOSIS — J449 Chronic obstructive pulmonary disease, unspecified: Secondary | ICD-10-CM

## 2023-07-07 DIAGNOSIS — Z86711 Personal history of pulmonary embolism: Secondary | ICD-10-CM

## 2023-07-07 DIAGNOSIS — I471 Supraventricular tachycardia, unspecified: Secondary | ICD-10-CM | POA: Diagnosis not present

## 2023-07-07 DIAGNOSIS — A4150 Gram-negative sepsis, unspecified: Secondary | ICD-10-CM | POA: Diagnosis not present

## 2023-07-07 DIAGNOSIS — Z9884 Bariatric surgery status: Secondary | ICD-10-CM

## 2023-07-07 DIAGNOSIS — R531 Weakness: Secondary | ICD-10-CM

## 2023-07-07 DIAGNOSIS — E559 Vitamin D deficiency, unspecified: Secondary | ICD-10-CM

## 2023-07-07 DIAGNOSIS — I480 Paroxysmal atrial fibrillation: Secondary | ICD-10-CM | POA: Diagnosis not present

## 2023-07-07 DIAGNOSIS — G039 Meningitis, unspecified: Secondary | ICD-10-CM | POA: Diagnosis not present

## 2023-07-07 LAB — CBC WITH DIFFERENTIAL/PLATELET
Abs Immature Granulocytes: 0.07 10*3/uL (ref 0.00–0.07)
Basophils Absolute: 0 10*3/uL (ref 0.0–0.1)
Basophils Relative: 0 %
Eosinophils Absolute: 0.3 10*3/uL (ref 0.0–0.5)
Eosinophils Relative: 4 %
HCT: 27.6 % — ABNORMAL LOW (ref 36.0–46.0)
Hemoglobin: 9.3 g/dL — ABNORMAL LOW (ref 12.0–15.0)
Immature Granulocytes: 1 %
Lymphocytes Relative: 11 %
Lymphs Abs: 0.9 10*3/uL (ref 0.7–4.0)
MCH: 26.7 pg (ref 26.0–34.0)
MCHC: 33.7 g/dL (ref 30.0–36.0)
MCV: 79.3 fL — ABNORMAL LOW (ref 80.0–100.0)
Monocytes Absolute: 0.4 10*3/uL (ref 0.1–1.0)
Monocytes Relative: 4 %
Neutro Abs: 6.7 10*3/uL (ref 1.7–7.7)
Neutrophils Relative %: 80 %
Platelets: 494 10*3/uL — ABNORMAL HIGH (ref 150–400)
RBC: 3.48 MIL/uL — ABNORMAL LOW (ref 3.87–5.11)
RDW: 18.3 % — ABNORMAL HIGH (ref 11.5–15.5)
WBC: 8.3 10*3/uL (ref 4.0–10.5)
nRBC: 0 % (ref 0.0–0.2)

## 2023-07-07 LAB — RENAL FUNCTION PANEL
Albumin: <1.5 g/dL — ABNORMAL LOW (ref 3.5–5.0)
Anion gap: 8 (ref 5–15)
BUN: 16 mg/dL (ref 8–23)
CO2: 21 mmol/L — ABNORMAL LOW (ref 22–32)
Calcium: 7.6 mg/dL — ABNORMAL LOW (ref 8.9–10.3)
Chloride: 106 mmol/L (ref 98–111)
Creatinine, Ser: 0.7 mg/dL (ref 0.44–1.00)
GFR, Estimated: >60 mL/min (ref >60)
Glucose, Bld: 70 mg/dL (ref 70–99)
Phosphorus: 2.5 mg/dL (ref 2.5–4.6)
Potassium: 3.2 mmol/L — ABNORMAL LOW (ref 3.5–5.1)
Sodium: 135 mmol/L (ref 135–145)

## 2023-07-07 LAB — GLUCOSE, CAPILLARY
Glucose-Capillary: 102 mg/dL — ABNORMAL HIGH (ref 70–99)
Glucose-Capillary: 103 mg/dL — ABNORMAL HIGH (ref 70–99)
Glucose-Capillary: 106 mg/dL — ABNORMAL HIGH (ref 70–99)
Glucose-Capillary: 117 mg/dL — ABNORMAL HIGH (ref 70–99)
Glucose-Capillary: 122 mg/dL — ABNORMAL HIGH (ref 70–99)
Glucose-Capillary: 62 mg/dL — ABNORMAL LOW (ref 70–99)
Glucose-Capillary: 87 mg/dL (ref 70–99)
Glucose-Capillary: 87 mg/dL (ref 70–99)

## 2023-07-07 LAB — MAGNESIUM: Magnesium: 1.7 mg/dL (ref 1.7–2.4)

## 2023-07-07 LAB — LACTIC ACID, PLASMA: Lactic Acid, Venous: 1.2 mmol/L (ref 0.5–1.9)

## 2023-07-07 LAB — VANCOMYCIN, TROUGH: Vancomycin Tr: 41 ug/mL (ref 15–20)

## 2023-07-07 MED ORDER — HYDROCORTISONE SOD SUC (PF) 100 MG IJ SOLR
5.0000 mg | Freq: Three times a day (TID) | INTRAMUSCULAR | Status: DC
  Filled 2023-07-07: qty 2

## 2023-07-07 MED ORDER — VANCOMYCIN VARIABLE DOSE PER UNSTABLE RENAL FUNCTION (PHARMACIST DOSING): Status: DC

## 2023-07-07 MED ORDER — DEXTROSE 50 % IV SOLN
12.5000 g | INTRAVENOUS | Status: AC
  Administered 2023-07-07: 12.5 g via INTRAVENOUS
  Filled 2023-07-07: qty 50

## 2023-07-07 MED ORDER — PANTOPRAZOLE SODIUM 40 MG IV SOLR
40.0000 mg | Freq: Every day | INTRAVENOUS | Status: DC
  Administered 2023-07-07 – 2023-07-12 (×6): 40 mg via INTRAVENOUS
  Filled 2023-07-07 (×6): qty 10

## 2023-07-07 MED ORDER — AMIODARONE HCL IN DEXTROSE 360-4.14 MG/200ML-% IV SOLN
60.0000 mg/h | INTRAVENOUS | Status: AC
  Administered 2023-07-07 (×2): 60 mg/h via INTRAVENOUS
  Filled 2023-07-07 (×2): qty 200

## 2023-07-07 MED ORDER — HEPARIN BOLUS VIA INFUSION
4600.0000 [IU] | Freq: Once | INTRAVENOUS | Status: AC
  Administered 2023-07-07: 4600 [IU] via INTRAVENOUS
  Filled 2023-07-07: qty 4600

## 2023-07-07 MED ORDER — DIGOXIN 0.25 MG/ML IJ SOLN
0.2500 mg | INTRAMUSCULAR | Status: AC | PRN
  Administered 2023-07-07 – 2023-07-13 (×2): 0.25 mg via INTRAVENOUS
  Filled 2023-07-07: qty 2

## 2023-07-07 MED ORDER — AMIODARONE LOAD VIA INFUSION
150.0000 mg | Freq: Once | INTRAVENOUS | Status: AC
  Administered 2023-07-07: 150 mg via INTRAVENOUS
  Filled 2023-07-07: qty 83.34

## 2023-07-07 MED ORDER — HYDROCORTISONE SOD SUC (PF) 100 MG IJ SOLR
100.0000 mg | Freq: Three times a day (TID) | INTRAMUSCULAR | Status: DC
  Administered 2023-07-07 – 2023-07-11 (×12): 100 mg via INTRAVENOUS
  Filled 2023-07-07 (×13): qty 2

## 2023-07-07 MED ORDER — HEPARIN (PORCINE) 25000 UT/250ML-% IV SOLN
1250.0000 [IU]/h | INTRAVENOUS | Status: DC
  Administered 2023-07-07: 1250 [IU]/h via INTRAVENOUS
  Filled 2023-07-07: qty 250

## 2023-07-07 MED ORDER — HYDROMORPHONE HCL 1 MG/ML IJ SOLN
0.5000 mg | INTRAMUSCULAR | Status: DC | PRN
  Administered 2023-07-07 – 2023-07-11 (×2): 0.5 mg via INTRAVENOUS
  Filled 2023-07-07: qty 1
  Filled 2023-07-07: qty 0.5

## 2023-07-07 MED ORDER — LACTATED RINGERS IV SOLN: INTRAVENOUS | Status: DC

## 2023-07-07 MED ORDER — FLUCONAZOLE IN SODIUM CHLORIDE 200-0.9 MG/100ML-% IV SOLN
200.0000 mg | INTRAVENOUS | Status: DC
  Administered 2023-07-07: 200 mg via INTRAVENOUS
  Filled 2023-07-07 (×2): qty 100

## 2023-07-07 MED ORDER — POTASSIUM CHLORIDE 10 MEQ/100ML IV SOLN
10.0000 meq | INTRAVENOUS | Status: AC
  Administered 2023-07-07 (×3): 10 meq via INTRAVENOUS
  Filled 2023-07-07 (×3): qty 100

## 2023-07-07 MED ORDER — AMIODARONE HCL IN DEXTROSE 360-4.14 MG/200ML-% IV SOLN
30.0000 mg/h | INTRAVENOUS | Status: DC
  Administered 2023-07-07 – 2023-07-13 (×10): 30 mg/h via INTRAVENOUS
  Filled 2023-07-07 (×12): qty 200

## 2023-07-07 MED ORDER — POTASSIUM CHLORIDE CRYS ER 20 MEQ PO TBCR: 40.0000 meq | EXTENDED_RELEASE_TABLET | Freq: Once | ORAL | Status: DC

## 2023-07-07 MED ORDER — MAGNESIUM SULFATE 2 GM/50ML IV SOLN
2.0000 g | Freq: Once | INTRAVENOUS | Status: AC
  Administered 2023-07-07: 2 g via INTRAVENOUS
  Filled 2023-07-07: qty 50

## 2023-07-07 NOTE — Plan of Care (Signed)
  Problem: Clinical Measurements: Goal: Diagnostic test results will improve Outcome: Progressing Goal: Cardiovascular complication will be avoided Outcome: Progressing   Problem: Elimination: Goal: Will not experience complications related to bowel motility Outcome: Progressing Goal: Will not experience complications related to urinary retention Outcome: Progressing   Problem: Pain Managment: Goal: General experience of comfort will improve and/or be controlled Outcome: Progressing   Problem: Safety: Goal: Ability to remain free from injury will improve Outcome: Progressing   Problem: Skin Integrity: Goal: Risk for impaired skin integrity will decrease Outcome: Progressing

## 2023-07-07 NOTE — TOC Initial Note (Signed)
 Transition of Care [facility]) - Initial/Assessment Note    Patient Details  Name: Wendy Sanchez MRN: [id number] Date of Birth: 1950-07-19  Transition of [facility]) CM/SW Contact:    Wendy Louder, LCSW Phone Number: 07/07/2023, 11:29 AM  Clinical Narrative:     LCSWA met with patient and sister Wendy Sanchez at bedside. LCSWA spoke with sister, due to patient being Disoriented x4. Wendy Sanchez stated that the patient stays at home with her son and that she stays across the street. Wendy Sanchez indicated that she takes care of her sister with the help of her son. Wendy Sanchez reported that the patient had a hospital bed, wheelchair, shower chair and hoyer lift at the home.  Wendy Sanchez indicated that the patients medication is affordable and is managed by her doctor Wendy Sanchez from [facility]. Contact Information: Number: [phone] Fax: [phone]). Dr. Jon Sanchez provides in home visits.   Wendy Sanchez stated that the patient uses [facility] and that their nurses come 2x/ week and PT was set to come 1x/week prior to admission. CM Wendy Sanchez with Shawn at [facility] and patient is active with RN and PT, he indicated that services will need resumption orders when done with discharge.   Wendy Sanchez reported that she wanted the patient to be discharged home due to the wound on her sacrum caused by [facility]. She indicated that she can take care of the patient with the help of her son and the [facility] agency [facility].   Wendy Sanchez had questions about Medicare and coverage options, specifically part C. LCSWA and CM Wendy educated and gave handout about different coverage options.   TOC to follow for discharge planning.   Expected Discharge Plan: Home/Self Care Barriers to Discharge: Continued Medical Work up   Patient Goals and CMS Choice Patient states their goals for this hospitalization and ongoing recovery are:: UTA          Expected Discharge Plan and Services   Discharge Planning  Services: CM Consult   Living arrangements for the past 2 months: Single Family Home                             HH Agency: [facility] ([facility])        Prior Living Arrangements/Services Living arrangements for the past 2 months: Single Family Home Lives with:: Adult Children Patient language and need for interpreter reviewed:: Yes Do you feel safe going back to the place where you live?: Yes      Need for Family Participation in Patient Care: Yes (Comment) Care giver support system in place?: Yes (comment) Current home services: Home PT, Home RN ([facility]) Criminal Activity/Legal Involvement Pertinent to Current Situation/Hospitalization: No - Comment as needed  Activities of Daily Living   ADL Screening (condition at time of admission) Independently performs ADLs?: No Does the patient have a NEW difficulty with bathing/dressing/toileting/self-feeding that is expected to last >3 days?: No Does the patient have a NEW difficulty with getting in/out of bed, walking, or climbing stairs that is expected to last >3 days?: No Does the patient have a NEW difficulty with communication that is expected to last >3 days?: No Is the patient deaf or have difficulty hearing?: Yes Does the patient have difficulty seeing, even when wearing glasses/contacts?: No Does the patient have difficulty concentrating, remembering, or making decisions?: No  Permission Sought/Granted Permission sought to share information with : Case Manager  Emotional Assessment Appearance:: Appears stated age Attitude/Demeanor/Rapport: Lethargic Affect (typically observed): Unable to Assess     Psych Involvement: No (comment)  Admission diagnosis:  Weakness [R53.1] Lower urinary tract infection [N39.0] Sepsis due to gram-negative UTI (HCC) [A41.50, N39.0] Patient Active Problem List   Diagnosis Date Noted   Reactive thrombocytosis 07/07/2023   Adrenal crisis (HCC)  07/07/2023   Sepsis due to gram-negative UTI (HCC) 07/01/2023   Syncope 07/01/2023   Paroxysmal atrial fibrillation (HCC) 07/01/2023   GERD without esophagitis 07/01/2023   Asthma-COPD overlap syndrome (HCC) 07/01/2023   Malnutrition of moderate degree 03/23/2023   RSV/bronchiolitis 03/21/2023   C. difficile colitis 03/21/2023   Abdominal pain 03/17/2023   Sepsis (HCC) 03/11/2023   Nonischemic cardiomyopathy (HCC) 03/11/2023   Acute on chronic HFrEF (heart failure with reduced ejection fraction) (HCC) 03/11/2023   SVT (supraventricular tachycardia) (HCC) 03/11/2023   Pressure injury of skin 03/08/2023   Obesity (BMI 30-39.9) 03/07/2023   Myocardial injury 03/07/2023   Depression with anxiety 03/07/2023   Chronic obstructive pulmonary disease (COPD) (HCC) 01/30/2023   Recurrent UTI 01/30/2023   Otitis externa 01/30/2023   Hypotension 01/30/2023   Peripheral neuropathy 01/30/2023   Acute pharyngitis 01/30/2023   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   Severe protein-calorie malnutrition (HCC) 10/10/2022   History of pulmonary embolus 2020(PE) 04/07/2021   Acute diverticulitis 01/30/2021   Current chronic use of systemic steroids 01/30/2021   Chronic diastolic CHF (congestive heart failure) (HCC) 01/30/2021   Compression fracture of T2 vertebra (HCC) 01/30/2021   S/P PICC central line placement 01/30/2021   Sinus tachycardia 11/12/2020   COVID-19 virus infection 11/11/2020   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/09/2020   AKI (acute kidney injury) (HCC) 11/09/2020   Pain in joint, shoulder region 07/03/2020   Giant cell arteritis (HCC) 05/01/2020   History of renal cell carcinoma 04/25/2020   HFrEF (heart failure with reduced ejection fraction) (HCC) 03/22/2020   Frontal headache 05/30/2019   Carcinoma, renal cell, left (HCC) 11/14/2018   Pulmonary embolism (HCC) 07/13/2018   DDD (degenerative disc disease), lumbar 06/30/2018   Chronic low back pain 06/30/2018   Hip pain,  bilateral 06/30/2018   Chronic, continuous use of opioids 06/30/2018   PVC (premature ventricular contraction) 06/04/2017   Chest pain 10/19/2016   Presence of right artificial knee joint 05/18/2016   Pes anserinus bursitis of left knee 05/18/2016   Unilateral primary osteoarthritis, right knee 12/13/2015   Hx of total knee arthroplasty 12/13/2015   Osteoarthritis of left knee 08/02/2015   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   Morbid obesity with BMI of 45.0-49.9, adult (HCC) 07/25/2015   Spinal stenosis of lumbar region 06/19/2015   Chronic pain syndrome 10/30/2014   PCP:  Freddrick, No Pharmacy:   [facility] [address], [place] - [address] [place] [address] Phone: [phone] Fax: [phone]  [id number], [place] - [address] AT [facility] OF [address] [place] [address] Phone: [phone] Fax: [phone]     Social Drivers of Health (SDOH) Social History: SDOH Screenings   Food Insecurity: No Food Insecurity (07/03/2023)  Housing: Low Risk  (07/03/2023)  Transportation Needs: Unmet Transportation Needs (07/03/2023)  Utilities: Not At Risk (07/03/2023)  Depression (PHQ2-9): Low Risk  (07/01/2022)  Financial Resource Strain: Low Risk  (12/26/2020)   Received from [facility]  Social Connections: Unknown (07/03/2023)  Recent Concern: Social Connections -  Socially Isolated (07/03/2023)  Tobacco Use: High Risk (07/01/2023)   SDOH Interventions:     Readmission Risk Interventions    03/08/2023   11:09 AM 02/03/2021    9:12 AM  Readmission Risk Prevention Plan  Transportation Screening Complete Complete  Medication Review (RN Care Manager) Complete Complete  PCP or Specialist appointment within 3-5 days of discharge Complete Complete  HRI or Home Care Consult  Complete  SW Recovery Care/Counseling Consult Complete   Palliative Care Screening Not Applicable Not  Applicable  Skilled Nursing Facility Complete Complete

## 2023-07-07 NOTE — Progress Notes (Signed)
 NEUROLOGY CONSULT FOLLOW UP NOTE   Date of service: July 07, 2023 Patient Name: Wendy Sanchez MRN:  [id number] DOB:  Sep 30, 1950  Interval Hx/subjective  The patient has clinically worsened since her initial Neurology evaluation. She is now in an ICU bed under the Hospitalist service.   Vitals   Vitals:   07/07/23 1300 07/07/23 1400 07/07/23 1500 07/07/23 1600  BP: 120/68 (!) 141/109 118/71 138/80  Pulse: 94 88 88 93  Resp: 13 19 (!) 26 14  Temp: 97.7 F (36.5 C)   97.9 F (36.6 C)  TempSrc:      SpO2: 100% 100% 100% 100%  Weight:      Height:         Body mass index is 37.78 kg/m.  Physical Exam   Constitutional: Ill-appearing, morbidly obese elderly female  Eyes: No scleral injection.  HENT: No OP obstruction.  Head: Normocephalic. Neck stiffness and pain in rotation and flexion. Respiratory: Effort normal, non-labored breathing.  Skin: Warm to touch.   Neurological Examination Mental Status: Severely encephalopathic. Does not answer any questions or follow any commands. Does not reliably blink to threat when eyelids are passively opened and does not make eye contact. Does not make any attempts to communicate, but will moan in apparent pain when limbs are passively elevated and flexed at elbows, shoulders and knees.  Cranial Nerves: II: No reliable blink to threat. PERRL 4 mm >> 3 mm. III,IV, VI: Eyes are conjugate and at the midline. No nystagmus. V: Reacts to eyelid stimulation bilaterally VII: Grimaces symmetrically VIII: Does not respond to voice IX,X: Gag reflex deferred XI: Unable to assess XII: Unable to assess Motor/Sensory: BUE: Weak withdrawal and moans to pinch equally bilaterally BLE: Weak withdrawal and moans to pinch equally bilaterally Deep Tendon Reflexes: 1+ and symmetric bilateral brachioradialis. Unable to elicit patellar reflexes due to prior knee replacements Cerebellar: Unable to assess Gait: Unable to assess   Medications  Current  Facility-Administered Medications:    acetaminophen  (TYLENOL ) tablet 650 mg, 650 mg, Oral, Q6H PRN, 650 mg at 07/05/23 0136 **OR** acetaminophen  (TYLENOL ) suppository 650 mg, 650 mg, Rectal, Q6H PRN, Mansy, Jan A, MD   acyclovir (ZOVIRAX) 880 mg in dextrose  5 % 250 mL IVPB, 10 mg/kg (Adjusted), Intravenous, Q8H, Imogean Ciampa, MD, Stopped at 07/07/23 1523   albuterol  (PROVENTIL ) (2.5 MG/3ML) 0.083% nebulizer solution 2.5 mg, 2.5 mg, Nebulization, Q6H PRN, Mansy, Jan A, MD   [COMPLETED] amiodarone  (NEXTERONE ) 1.8 mg/mL load via infusion 150 mg, 150 mg, Intravenous, Once, 150 mg at 07/07/23 0956 **FOLLOWED BY** [EXPIRED] amiodarone  (NEXTERONE  PREMIX) 360-4.14 MG/200ML-% (1.8 mg/mL) IV infusion, 60 mg/hr, Intravenous, Continuous, Stopped at 07/07/23 1630 **FOLLOWED BY** amiodarone  (NEXTERONE  PREMIX) 360-4.14 MG/200ML-% (1.8 mg/mL) IV infusion, 30 mg/hr, Intravenous, Continuous, Gollan, Timothy J, MD, Last Rate: 16.67 mL/hr at 07/07/23 1630, 30 mg/hr at 07/07/23 1630   Chlorhexidine  Gluconate Cloth 2 % PADS 6 each, 6 each, Topical, Daily, Djan, Prince T, MD, 6 each at 07/07/23 1000   digoxin (LANOXIN) 0.25 MG/ML injection 0.25 mg, 0.25 mg, Intravenous, Q2H PRN, Cleatus Hoof V, MD, 0.25 mg at 07/07/23 9352   diphenhydrAMINE  (BENADRYL ) capsule 25 mg, 25 mg, Oral, Q6H PRN, Mansy, Jan A, MD   fluconazole  (DIFLUCAN ) IVPB 200 mg, 200 mg, Intravenous, Q24H, Nada Adriana BIRCH, RPH, Stopped at 07/07/23 1344   heparin  ADULT infusion 100 units/mL (25000 units/250mL), 1,250 Units/hr, Intravenous, Continuous, Nada Adriana BIRCH, RPH, Last Rate: 12.5 mL/hr at 07/07/23 1630, 1,250 Units/hr at 07/07/23 1630  hydrocortisone  sodium succinate  (SOLU-CORTEF ) 100 MG injection 100 mg, 100 mg, Intravenous, Q8H, Laurita Pillion, MD, 100 mg at 07/07/23 1101   HYDROmorphone  (DILAUDID ) injection 0.5 mg, 0.5 mg, Intravenous, Q4H PRN, Laurita Pillion, MD, 0.5 mg at 07/07/23 1056   lactated ringers  infusion, , Intravenous, Continuous, Laurita Pillion, MD, Last Rate: 75 mL/hr at 07/07/23 1630, Infusion Verify at 07/07/23 1630   lactose free nutrition (BOOST PLUS) liquid 237 mL, 237 mL, Oral, TID WC, Djan, Drue DASEN, MD   levETIRAcetam (KEPPRA) undiluted injection 1,000 mg, 1,000 mg, Intravenous, Q12H, Djan, Prince T, MD, 1,000 mg at 07/07/23 0944   liver oil-zinc  oxide (DESITIN) 40 % ointment, , Topical, BID, Mansy, Jan A, MD, Given at 07/07/23 1000   liver oil-zinc  oxide (DESITIN) 40 % ointment, , Topical, PRN, Mansy, Jan A, MD   magnesium  hydroxide (MILK OF MAGNESIA) suspension 30 mL, 30 mL, Oral, Daily PRN, Mansy, Jan A, MD   melatonin tablet 5 mg, 5 mg, Oral, QHS PRN, Mansy, Jan A, MD   meropenem  (MERREM ) 2 g in sodium chloride  0.9 % 100 mL IVPB, 2 g, Intravenous, Q8H, Djan, Drue DASEN, MD, Last Rate: 280 mL/hr at 07/07/23 1630, Infusion Verify at 07/07/23 1630   metoprolol  succinate (TOPROL -XL) 24 hr tablet 12.5 mg, 12.5 mg, Oral, Daily, Djan, Prince T, MD, 12.5 mg at 07/05/23 1045   metoprolol  tartrate (LOPRESSOR ) injection 5 mg, 5 mg, Intravenous, Q6H PRN, Dorinda Drue DASEN, MD, 5 mg at 07/07/23 9367   midodrine  (PROAMATINE ) tablet 5 mg, 5 mg, Oral, TID AC, Mansy, Jan A, MD, 5 mg at 07/05/23 1740   nystatin  cream (MYCOSTATIN ), , Topical, BID, Dorinda Drue DASEN, MD, Given at 07/07/23 1000   ondansetron  (ZOFRAN ) tablet 4 mg, 4 mg, Oral, Q6H PRN **OR** ondansetron  (ZOFRAN ) injection 4 mg, 4 mg, Intravenous, Q6H PRN, Mansy, Jan A, MD, 4 mg at 07/04/23 1223   oxyCODONE -acetaminophen  (PERCOCET/ROXICET) 5-325 MG per tablet 1 tablet, 1 tablet, Oral, Q6H PRN, 1 tablet at 07/05/23 1123 **AND** oxyCODONE  (Oxy IR/ROXICODONE ) immediate release tablet 5 mg, 5 mg, Oral, Q6H PRN, Mansy, Jan A, MD, 5 mg at 07/03/23 2158   pantoprazole  (PROTONIX ) injection 40 mg, 40 mg, Intravenous, QHS, Nada Adriana BIRCH, RPH   polyethylene glycol (MIRALAX  / GLYCOLAX ) packet 17 g, 17 g, Oral, Daily PRN, Mansy, Jan A, MD   promethazine  (PHENERGAN ) 12.5 mg in sodium chloride  0.9 %  50 mL IVPB, 12.5 mg, Intravenous, Q6H PRN, Dorinda Drue DASEN, MD   torsemide  (DEMADEX ) tablet 10 mg, 10 mg, Oral, Daily, Mansy, Jan A, MD, 10 mg at 07/04/23 0957   vancomycin  (VANCOCIN ) IVPB 1000 mg/200 mL premix, 1,000 mg, Intravenous, Q12H, Dorinda Drue DASEN, MD, Stopped at 07/07/23 1325  Labs and Diagnostic Imaging   CBC:  Recent Labs  Lab 07/06/23 1017 07/07/23 0318  WBC 10.7* 8.3  NEUTROABS 8.8* 6.7  HGB 8.9* 9.3*  HCT 27.0* 27.6*  MCV 80.1 79.3*  PLT 460* 494*    Basic Metabolic Panel:  Lab Results  Component Value Date   NA 135 07/07/2023   K 3.2 (L) 07/07/2023   CO2 21 (L) 07/07/2023   GLUCOSE 70 07/07/2023   BUN 16 07/07/2023   CREATININE 0.70 07/07/2023   CALCIUM  7.6 (L) 07/07/2023   GFRNONAA >60 07/07/2023   GFRAA >60 06/07/2019   Lipid Panel:  Lab Results  Component Value Date   LDLCALC 45 03/08/2023   HgbA1c:  Lab Results  Component Value Date   HGBA1C 4.9 03/07/2023  Urine Drug Screen: No results found for: LABOPIA, COCAINSCRNUR, LABBENZ, AMPHETMU, THCU, LABBARB  Alcohol  Level No results found for: [facility] INR  Lab Results  Component Value Date   INR 1.8 (H) 07/05/2023   APTT  Lab Results  Component Value Date   APTT 49 (H) 07/05/2023     Assessment  DNIYA NEUHAUS is a 73 y.o. female admitted for sepsis and encephalopathy with new onset twitching, complicated by adrenal crisis and tachycardia. Her encephalopathy has progressively worsened.  - Exam reveals findings consistent with a severe encephalopathy. She has diffuse pain with gentle passive movement of all 4 extremities by examiner. Also with neck stiffness in rotation and flexion. Encephalopathy has worsened since prior exam.  - CT head (6/19): Generalized cerebral atrophy and microvascular disease changes of the supratentorial brain. No acute intracranial abnormality. - Of note, she has an allergy to iodinated contrast (anaphylaxis) - EEGs: - EEG (6/20):  Intermittent slow,  generalized. This study is suggestive of mild diffuse encephalopathy. No seizures or epileptiform discharges were seen throughout the recording. - EEG (6/23): Abnormalities seen consist of generalized periodic discharges with triphasic morphology (GPDs) in addition to continuous generalized slowing. Overall, this study is suggestive of moderate diffuse encephalopathy typically related to toxic-metabolic causes like cefepime  toxicity. No seizures or epileptiform discharges were seen throughout the recording. - MRI brain w/wo contrast (personally reviewed): Moderate to severely motion limited study without evidence of obvious acute abnormality. No abnormal enhancement is seen. No temporal lobe signal abnormalities.  - Labs: - WBC today is improved to 8.3  - Recent B12 level was elevated - Recent vitamin A  level was low at 9.4 - Recent zinc  level was low at 29 - AM cortisol on 6/20 was low at 2.5.  - ID is consulting. They doubt a meningitis, but LP cannot be done to rule this out.  - Impression: - Most likely underlying etiologies for her worsened encephalopathy include meningitis and residual cefepime -neurotoxicity. Cefepime  has been stopped. There may be a component of hospital delirium as well.  - Initial spell in the ED was most likely syncope secondary to hypotension - Her diffuse limb jerking with stimulation is now resolved. This may have been secondary to Neurontin  toxicity - Seizure with postictal state is also possible, but this is difficult to differentiate from infection with encephalopathy. Her infection could certainly lower her seizure threshold. Seizure is now lower on the DDx.  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  - Prior history of giant cell arteritis. No stroke on MRI this admission.  - Other active Problems: COPD, lower urinary tract infection, hypotension, peripheral neuropathy, paroxysmal atrial fibrillation,  reactive thrombocytosis, adrenal crisis, chronically bedbound with chronic ankle contractures   Recommendations  - Neurontin  has been discontinued  - Continue Keppra at 1000 mg BID - Currently on meropenem , vancomycin  and acyclovir per ID recommendations  - Unable to obtain LP due to anticoagulation - Vitamin and mineral supplementation per Dietician consultant - Neurology will continue to follow with you  40 minutes spent in the neurological evaluation and management of this critically ill patient. ______________________________________________________________________   Bonney SHARK, Mallika Sanmiguel, MD [facility]

## 2023-07-07 NOTE — Hospital Course (Addendum)
 From HPI Wendy Sanchez is a 73 y.o. African-American female with medical history significant for asthma, osteoarthritis, giant cell arthritis, combined systolic and diastolic CHF, COPD, DVT, PE and atrial fibrillation on Eliquis , who presented to the emergency room with acute onset of increased weakness.  Patient was hypotensive upon arriving to hospital, she also had a syncope due to hypotension. Patient also has tachycardia, tachypnea and leukocytosis.  She also has severe altered mental status, EEG did not show any evidence of seizure.  MRI of the brain poor quality, but did not show any acute changes.  LP was performed, could not collect any CSF. Her urine culture grow E. coli Serratia and Pseudomonas.  Patient was placed on broad-spectrum antibiotics.  Patient mental status improved in the morning of 6/28 after restarting Neurontin .  But the patient developed significant volume overload, started on IV Lasix .

## 2023-07-07 NOTE — Consult Note (Signed)
 Pharmacy Antibiotic Note  Wendy Sanchez is a 73 y.o. female admitted on 07/01/2023 with meningitis.  Pharmacy has been consulted for vancomycin , ceftriaxone , acyclovir, and ampicillin dosing. Meropenem  replaced ceftriaxone  and ampicillin to provider coverage for pseudomonas in urine cx and sacral wound. Possible underlying etiologies for her worsened encephalopathy include meningitis and cefepime -neurotoxicity.  LP was attempted 6/24 but not able to obtain any CSF fluid.  MRI brain attempted 6/25 but study limited due to motion.    Today, 07/07/2023 Day #6 vancomycin , Day #3 CNS coverage - now meropenem  and acyclovir - on fluconazole  prior to admission - looks to be chronic medication per dispense records Renal: SCr WNL, stable afebrile WBC 8.3 6/19 Ucx (cath): >100k E coli (R amp, cip), S. marcescens (R NTF), PsA (pan susc) 6/19 Bcx NGTD 6/20 sacral wound (swab by ED RN): PsA (pan-susc), C. striatum, B fragilis 6/20 resp panel: Neg 6/24 Bcx: NGTD 6/24 LP and MRI attempted  Continues to be encephalopathic and twitching  Vancomycin  level: Vancomycin  dose adjusted from 1750mg  IV q24h to 1000mg  IV q12h on 6/24 for meningitis Vancomycin  trough at 23:30 (prior to 6th dose of 1gm q12h) = ___ mcg/ml   Plan: Continue vancomycin  1000 mg q12H for meningitis for a goal trough 15-20 mcg/ml Not using AUC dosing since treating CNS infection Continue acyclovir 10 mg/kg IV q8H  Continue meropenem  2gm IV q8h (CNS dosing) Monitor renal function closely Check Vancomycin  trough this evening    Height: 5' 8 (172.7 cm) Weight: 112.7 kg (248 lb 7.3 oz) IBW/kg (Calculated) : 63.9  Temp (24hrs), Avg:98 F (36.7 C), Min:97.7 F (36.5 C), Max:98.3 F (36.8 C)  Recent Labs  Lab 07/01/23 1645 07/01/23 1936 07/04/23 0432 07/05/23 0253 07/05/23 2336 07/05/23 2338 07/06/23 0246 07/06/23 1017 07/07/23 0318 07/07/23 0947  WBC  --    < > 6.8 15.7* 11.2*  --   --  10.7* 8.3  --   CREATININE  --     < > 0.91 0.88 0.88  --   --  0.83 0.70  --   LATICACIDVEN 1.7  --   --   --   --  1.2 4.5*  --   --  1.2   < > = values in this interval not displayed.    Estimated Creatinine Clearance: 83.7 mL/min (by C-G formula based on SCr of 0.7 mg/dL).    Allergies  Allergen Reactions   Cefepime  Other (See Comments)    Encephalopathy requiring transfer to ICU on 07/05/2023 (not allergy so may use other cephalosporins and beta lactams).     Hydrocodone  Swelling   Hydrocodone -Acetaminophen  Swelling    hands   Iodinated Contrast Media Anaphylaxis   Iodine Anaphylaxis and Swelling    Iv dye  02/03/21-Ispoke to patient and she had IV contrast  X4 last year and has had no problem. ( Last CT abdomen with contrast was on 01/02/21.-   Nitrofurantoin    Other Other (See Comments)    ALLERGY TO METAL - BLACKENS SKIN AND CAUSES A RASH   Tape Swelling and Other (See Comments)    Skin comes off.  Paper tape is ok tegaderm OK   Shellfish-Derived Products    Wound Dressing Adhesive    Peanut Oil Other (See Comments)    ** Nuts cause runny nose   Shellfish Allergy Nausea And Vomiting and Swelling    Episode of GI infection after eating clam chowder. Still eats shrimp and other seafood   Antibiotics this admit:  Cefepime  6/19>> 6/23 ceftriaxone  6/23 >> 6/24 ampicillin 6/23 >> 6/24 vancomycin  6/20>> fluconazole  (oral candidiasis) 6/20 >> meropenem  6/24 >> acyclovir 6/23 >>  Thank you for allowing pharmacy to be a part of this patient's care.  Matea Stanard, PharmD, BCPS, BCIDP Work Cell: [phone] 07/07/2023 3:11 PM

## 2023-07-07 NOTE — Consult Note (Signed)
 WOC Nurse Consult Note: Reason for Consult: pressure injury; suspect source of infection per consulting MD. Followed by ID as well. Reviewed wound images, patient has been seen this admission for chronic pressure injury by WOC nursing  Wound type: Stage 3 Pressure Injury; sacrum  Pressure Injury POA: Yes Measurement: see nursing flow sheets Wound bed: 70% pink/30% slough/fibrin Drainage (amount, consistency, odor) see nursing flow sheets Periwound: intact  Dressing procedure/placement/frequency: Orders updated for care; DC vashe gauze in wound base, continue silver hydrofiber and foam to manage exudate and serve as antimicrobial.  LALM in place for moisture management and pressure redistribution Consult RD for wound supplementation  Notified MD that bedside nursing would be the ones to send wound cultures.  [place] to bedside nursing as well.    Re consult if needed, will not follow at this time. Thanks  Lehi Phifer [employer], RN,CWOCN, CNS, CWON-AP [phone])

## 2023-07-07 NOTE — Progress Notes (Signed)
 PHARMACY - ANTICOAGULATION CONSULT NOTE  Pharmacy Consult for heparin  infusion Indication: h/o DVT & PE  Allergies  Allergen Reactions   Cefepime  Other (See Comments)    Encephalopathy requiring transfer to ICU on 07/05/2023 (not allergy so may use other cephalosporins and beta lactams).     Hydrocodone  Swelling   Hydrocodone -Acetaminophen  Swelling    hands   Iodinated Contrast Media Anaphylaxis   Iodine Anaphylaxis and Swelling    Iv dye  02/03/21-Ispoke to patient and she had IV contrast  X4 last year and has had no problem. ( Last CT abdomen with contrast was on 01/02/21.-   Nitrofurantoin    Other Other (See Comments)    ALLERGY TO METAL - BLACKENS SKIN AND CAUSES A RASH   Tape Swelling and Other (See Comments)    Skin comes off.  Paper tape is ok tegaderm OK   Shellfish-Derived Products    Wound Dressing Adhesive    Peanut Oil Other (See Comments)    ** Nuts cause runny nose   Shellfish Allergy Nausea And Vomiting and Swelling    Episode of GI infection after eating clam chowder. Still eats shrimp and other seafood    Patient Measurements: Height: 5' 8 (172.7 cm) Weight: 112.7 kg (248 lb 7.3 oz) IBW/kg (Calculated) : 63.9 Heparin  Dosing Weight: 91.1 kg   Vital Signs: Temp: 97.7 F (36.5 C) (06/25 1300) Temp Source: Axillary (06/25 0400) BP: 120/68 (06/25 1300) Pulse Rate: 94 (06/25 1300)  Labs: Recent Labs    07/05/23 2336 07/06/23 1017 07/07/23 0318  HGB 8.7* 8.9* 9.3*  HCT 26.0* 27.0* 27.6*  PLT 492* 460* 494*  APTT 49*  --   --   LABPROT 21.2*  --   --   INR 1.8*  --   --   CREATININE 0.88 0.83 0.70    Estimated Creatinine Clearance: 83.7 mL/min (by C-G formula based on SCr of 0.7 mg/dL).   Medical History: Past Medical History:  Diagnosis Date   Allergic rhinitis    Arthritis    Asthma    problems with fumes and aerosols cause asthma   Chest pain 10/19/2016   CHF (congestive heart failure) (HCC)    Complication of anesthesia    awakens  during surgery; has occurred with last 3-4 surgeries    COPD (chronic obstructive pulmonary disease) (HCC)    DVT (deep venous thrombosis) (HCC) 07/13/2018   Right leg   Environmental allergies    fumes    Fibromyalgia    GERD (gastroesophageal reflux disease)    Headache    History of bronchitis    Hx of total knee arthroplasty 12/13/2015   Hyperlipidemia    Hypertension    Morbid obesity with BMI of 45.0-49.9, adult (HCC) 07/25/2015   NICM (nonischemic cardiomyopathy) (HCC)    a. ? PVC mediated;  b. 06/2016 Echo: EF 25-30%, mild LVH, mild LAE, mild AI/MR/TR/PR; c. 08/2016 Cath: nl cors, EF 25%.   Numbness    hands bilat when driving; improves when not preforming task    Osteoarthritis of left knee 08/02/2015   PE (pulmonary thromboembolism) (HCC) 07/13/2018   Pes anserinus bursitis of left knee 05/18/2016   Presence of right artificial knee joint 05/18/2016   PVC (premature ventricular contraction) 06/04/2017   PVC's (premature ventricular contractions)    a. 11/2016 Amio started;  b. 11/29/16 48h Holter: 57574 PVC's (41%); c. 12/2016 24h Holter: 18040 PVC's (15%); c. 02/2017 48h Holter: 37262 PVC's (41%).   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   Temporal arteritis (HCC) 05/01/2020   Tinnitus    comes and goes    Unilateral primary osteoarthritis, right knee 12/13/2015   Vertigo    none for over 2 yrs    Medications:  Scheduled:   Chlorhexidine  Gluconate Cloth  6 each Topical Daily   hydrocortisone  sod succinate (SOLU-CORTEF ) inj  100 mg Intravenous Q8H   lactose free nutrition  237 mL Oral TID WC   levETIRAcetam  1,000 mg Intravenous Q12H   liver oil-zinc  oxide   Topical BID   metoprolol  succinate  12.5 mg Oral Daily   midodrine   5 mg Oral TID AC   nystatin  cream   Topical BID   pantoprazole  (PROTONIX ) IV  40 mg Intravenous QHS   torsemide    10 mg Oral Daily   Infusions:   acyclovir 880 mg (07/07/23 1423)   amiodarone  60 mg/hr (07/07/23 1234)   Followed by   amiodarone      fluconazole  (DIFLUCAN ) IV 200 mg (07/07/23 1244)   lactated ringers  75 mL/hr at 07/07/23 0917   meropenem  (MERREM ) IV 2 g (07/07/23 1337)   promethazine  (PHENERGAN ) injection (IM or IVPB)     vancomycin  1,000 mg (07/07/23 1225)    Assessment: 73 y.o. African-American female with medical history significant for asthma, osteoarthritis, giant cell arthritis, combined systolic and diastolic CHF, COPD, DVT, PE and atrial fibrillation on Eliquis , who presented to the emergency room with acute onset of increased weakness. Last dose of apixaban  07/05/23 0919   Goal of Therapy:  anti-Xa level 0.3-0.7 units/ml aPTT 66 - 102 seconds Monitor platelets by anticoagulation protocol: Yes   Plan:  ---Give IV heparin  4600 units bolus x 1 ---Start heparin  infusion at 1250 units/hr (rate based on data from previous admission) ---Check  aPTT level in 8 hours (we will use aPTT to guide therapy until aPTT and anti-Xa level correlate) ---Continue to monitor H&H and platelets  Wendy Sanchez 07/07/2023,2:51 PM

## 2023-07-07 NOTE — Consult Note (Signed)
 Cardiology Consultation   Patient ID: [name] MRN: [id number]; DOB: 1950-03-17  Admit date: 07/01/2023 Date of Consult: 07/07/2023  PCP:  Wendy Sanchez   [facility] Providers Cardiologist:  Wendy Lunger, MD  Electrophysiologist:  Wendy Sage, MD       Patient Profile: Wendy Sanchez is a 73 y.o. female with a hx of arthritis, asthma, HFrEF, COPD, fibromyalgia, GERD, hyperlipidemia, hypertension, morbid obesity s/p gastric banding, nonischemic cardiomyopathy, DVT/PE on Eliquis , PVCs, OSA not on CPAP, frequent PVCs s/p ablation, spinal stenosis, temporal arteritis, and vertigo who is being seen 07/07/2023 for the evaluation of tachyarrhythmia at the request of Dr. Laurita.  History of Present Illness:   Wendy Sanchez has a longstanding history of LV dysfunction dating back to 2018, at that time EF 25 to 30%, diagnostic cath showed normal coronaries. Also with history of high PVC burden, previously on amiodarone , status post ablation in 2019.  Previously followed with [facility] cardiology who medically managed her nonischemic cardiomyopathy. Echo 11/2020 showed EF improved to 60 to 65%. Echo 03/2021 with EF 55%. Echo 09/2022 with EF 40 to 45%. Was most recently seen by [facility] cardiology 05/2022 at that time she was doing well on medical management. She was experiencing intermittent palpitations. GDMT was limited by low to normal blood pressure. Patient reported that her blood pressure normally runs around 80/40 mmHg. Continued on torsemide  10 mg daily, discussed option to add midodrine  to increase blood pressure, patient declined.   Recent hospitalization 03/11/2023-03/22/2023 for hypotension and sepsis.  Hospitalization was complicated by acute on chronic HFrEF, SVT, RSV, and C. difficile.  RHC 3/4 showed low filling pressures, preserved cardiac output.  Echo showed EF 35 to 40% with severe LV dilation, mild LVH, normal RV, mild MR, mild aortic stenosis.  Coronary angiography showed no CAD,  nonischemic cardiomyopathy.  Cardiac MRI showed LVEF 30%, RVEF 38%, myocardium difficult to normal with PVC 41%.  Some features of the study were consistent with cardiac amyloidosis.  However, the LV wall was not significantly thickened.  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  GDMT was optimized and she was discharged in stable condition    Wendy Sanchez presented to [facility] ED 6/19 for concerns for possible UTI with chronic foley catheter in addition to worsening sacral wound.  Vital signs in the ED included BP 89/78, HR 52 with otherwise normal vital signs.  Patient had a brief syncopal episode in the ED, not thought to be consistent with seizure. She was found to have leukocytosis with WBC 12.1.  SIRS criteria was met with sacral wound as source versus UTI.  UA positive for UTI. Her urine culture grew E. coli Serratia and Pseudomonas.  Initial EKG showed sinus rhythm.  She was given normal saline, midodrine , and started on IV antibiotics. Wound care was consulted for a deep sacral decubitus ulcer. On 6/23 patient was noted to have tremors involving the right face, legs, and bilateral upper extremities. EEG was obtained and did not show any epileptiform waves but was consistent with encephalopathy. Patient was started on Keppra. Neurology was consulted and recommended alternative antibiotic due to possible cefepime  neurotoxicity. On 6/23 patient was noted to have progressive decline in mentation. Rapid reponse was called due to patient not alert, not following commands and opening eyes only to physical stimulation. She was noted to be in SVT with rate 170s and hypotensive. Lactic acid 4.5. LP was not able to be completed.  Infectious disease was consulted and recommended antibiotic adjustments. Early this morning patient was noted to have intermittent SVT overnight. She was given IV digoxin and metoprolol . Cardiology was  asked to consult for further evaluation and management of tachyarrhythmia. Patient is somnolent on exam and her sister who is also her caretaker at home is present to assist with history.   Past Medical History:  Diagnosis Date   Allergic rhinitis    Arthritis    Asthma    problems with fumes and aerosols cause asthma   Chest pain 10/19/2016   CHF (congestive heart failure) (HCC)    Complication of anesthesia    awakens during surgery; has occurred with last 3-4 surgeries    COPD (chronic obstructive pulmonary disease) (HCC)    DVT (deep venous thrombosis) (HCC) 07/13/2018   Right leg   Environmental allergies    fumes    Fibromyalgia    GERD (gastroesophageal reflux disease)    Headache    History of bronchitis    Hx of total knee arthroplasty 12/13/2015   Hyperlipidemia    Hypertension    Morbid obesity with BMI of 45.0-49.9, adult (HCC) 07/25/2015   NICM (nonischemic cardiomyopathy) (HCC)    a. ? PVC mediated;  b. 06/2016 Echo: EF 25-30%, mild LVH, mild LAE, mild AI/MR/TR/PR; c. 08/2016 Cath: nl cors, EF 25%.   Numbness    hands bilat when driving; improves when not preforming task    Osteoarthritis of left knee 08/02/2015   PE (pulmonary thromboembolism) (HCC) 07/13/2018   Pes anserinus bursitis of left knee 05/18/2016   Presence of right artificial knee joint 05/18/2016   PVC (premature ventricular contraction) 06/04/2017   PVC's (premature ventricular contractions)    a. 11/2016 Amio started;  b. 11/29/16 48h Holter: 57574 PVC's (41%); c. 12/2016 24h Holter: 18040 PVC's (15%); c. 02/2017 48h Holter: 37262 PVC's (41%).   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   Temporal arteritis (HCC) 05/01/2020   Tinnitus    comes and goes    Unilateral primary osteoarthritis, right knee 12/13/2015   Vertigo    none for over 2 yrs    Past Surgical History:   Procedure Laterality Date   ABDOMINAL HYSTERECTOMY  1979   left ovary remains   ABDOMINAL HYSTERECTOMY     ARTERY BIOPSY Left 06/07/2019   Procedure: BIOPSY TEMPORAL ARTERY;  Surgeon: Marea Selinda RAMAN, MD;  Location: [facility] ORS;  Service: Vascular;  Laterality: Left;   CHOLECYSTECTOMY     COLON SURGERY     COLONOSCOPY     COLONOSCOPY N/A 10/30/2019   Procedure: COLONOSCOPY;  Surgeon: Maryruth Ole DASEN, MD;  Location: [facility] ENDOSCOPY;  Service: Endoscopy;  Laterality: N/A;   ESOPHAGOGASTRODUODENOSCOPY N/A 10/30/2019   Procedure: ESOPHAGOGASTRODUODENOSCOPY (EGD);  Surgeon: Maryruth Ole DASEN, MD;  Location: [facility] ENDOSCOPY;  Service: Endoscopy;  Laterality: N/A;   fibrous tissue removed from right shoulder and back of neck      2 years ago    FLOOR OF MOUTH BIOPSY N/A 02/28/2019   Procedure: SALIVARY GLAND BIOPSY;  Surgeon: Milissa Hamming, MD;  Location: [facility] ORS;  Service: ENT;  Laterality: N/A;   FRACTURE SURGERY     left small finger   GANGLION CYST EXCISION     GASTRIC BYPASS  1980   states had bypass with banding and band left in -  GASTRIC BYPASS OPEN     JOINT REPLACEMENT Bilateral 2017   knees   KNEE SURGERY Bilateral    both knees  2001   LAPAROSCOPIC SIGMOID COLECTOMY Left 09/13/2020   at [facility] for a perforated sigmoid diverticulitis   NECK SURGERY N/A 1205/19   ganglion cyst removal, benigh    PARTIAL COLECTOMY  09/2020   PULMONARY THROMBECTOMY Bilateral 07/14/2018   Procedure: PULMONARY THROMBECTOMY;  Surgeon: Marea Selinda RAMAN, MD;  Location: [facility] INVASIVE CV LAB;  Service: Cardiovascular;  Laterality: Bilateral;   PVC ABLATION N/A 06/04/2017   Procedure: PVC ABLATION;  Surgeon: Waddell Danelle ORN, MD;  Location: [facility] INVASIVE CV LAB;  Service: Cardiovascular;  Laterality: N/A;   RIGHT/LEFT HEART CATH AND CORONARY ANGIOGRAPHY Bilateral 08/12/2016   Procedure: Right/Left Heart Cath and Coronary Angiography;  Surgeon: Florencio Cara BIRCH, MD;  Location: [facility] INVASIVE CV LAB;   Service: Cardiovascular;  Laterality: Bilateral;   RIGHT/LEFT HEART CATH AND CORONARY ANGIOGRAPHY N/A 03/16/2023   Procedure: RIGHT/LEFT HEART CATH AND CORONARY ANGIOGRAPHY;  Surgeon: Rolan Ezra RAMAN, MD;  Location: [facility] INVASIVE CV LAB;  Service: Cardiovascular;  Laterality: N/A;   TONSILLECTOMY     age 27   TOTAL KNEE ARTHROPLASTY Left 08/02/2015   Procedure: LEFT TOTAL KNEE ARTHROPLASTY;  Surgeon: Lonni CINDERELLA Poli, MD;  Location: [facility] ORS;  Service: Orthopedics;  Laterality: Left;   TOTAL KNEE ARTHROPLASTY Right 12/13/2015   Procedure: RIGHT TOTAL KNEE ARTHROPLASTY;  Surgeon: Lonni CINDERELLA Poli, MD;  Location: [facility] OR[facility];  Service: Orthopedics;  Laterality: Right;       Scheduled Meds:  Chlorhexidine  Gluconate Cloth  6 each Topical Daily   hydrocortisone  sod succinate (SOLU-CORTEF ) inj  100 mg Intravenous Q8H   lactose free nutrition  237 mL Oral TID WC   levETIRAcetam  1,000 mg Intravenous Q12H   liver oil-zinc  oxide   Topical BID   metoprolol  succinate  12.5 mg Oral Daily   midodrine   5 mg Oral TID AC   nystatin  cream   Topical BID   pantoprazole  (PROTONIX ) IV  40 mg Intravenous QHS   torsemide   10 mg Oral Daily   Continuous Infusions:  acyclovir 880 mg (07/07/23 0526)   amiodarone  60 mg/hr (07/07/23 1016)   Followed by   amiodarone      fluconazole  (DIFLUCAN ) IV     lactated ringers  75 mL/hr at 07/07/23 0917   meropenem  (MERREM ) IV 2 g (07/07/23 0642)   potassium chloride  10 mEq (07/07/23 1214)   promethazine  (PHENERGAN ) injection (IM or IVPB)     vancomycin  1,000 mg (07/07/23 1225)   PRN Meds: acetaminophen  **OR** acetaminophen , albuterol , digoxin, diphenhydrAMINE , HYDROmorphone  (DILAUDID ) injection, liver oil-zinc  oxide, magnesium  hydroxide, melatonin, metoprolol  tartrate, ondansetron  **OR** ondansetron  (ZOFRAN ) IV, oxyCODONE -acetaminophen  **AND** oxyCODONE , polyethylene glycol, promethazine  (PHENERGAN ) injection (IM or IVPB)  Allergies:    Allergies  Allergen  Reactions   Cefepime  Other (See Comments)    Encephalopathy requiring transfer to ICU on 07/05/2023 (not allergy so may use other cephalosporins and beta lactams).     Hydrocodone  Swelling   Hydrocodone -Acetaminophen  Swelling    hands   Iodinated Contrast Media Anaphylaxis   Iodine Anaphylaxis and Swelling    Iv dye  02/03/21-Ispoke to patient and she had IV contrast  X4 last year and has had no problem. ( Last CT abdomen with contrast was on 01/02/21.-   Nitrofurantoin    Other Other (See Comments)    ALLERGY TO METAL - BLACKENS SKIN AND CAUSES A RASH   Tape Swelling and Other (  See Comments)    Skin comes off.  Paper tape is ok tegaderm OK   Shellfish-Derived Products    Wound Dressing Adhesive    Peanut Oil Other (See Comments)    ** Nuts cause runny nose   Shellfish Allergy Nausea And Vomiting and Swelling    Episode of GI infection after eating clam chowder. Still eats shrimp and other seafood    Social History:   Social History   Socioeconomic History   Marital status: Single    Spouse name: Not on file   Number of children: 3   Years of education: Not on file   Highest education level: Some college, no degree  Occupational History   Not on file  Tobacco Use   Smoking status: Former    Current packs/day: 0.00    Average packs/day: 0.3 packs/day for 50.0 years (12.5 ttl pk-yrs)    Types: Cigarettes    Start date: 08/07/1970    Quit date: 08/06/2020    Years since quitting: 2.9   Smokeless tobacco: Current    Types: Chew  Vaping Use   Vaping status: Never Used  Substance and Sexual Activity   Alcohol  use: No    Alcohol /week: 0.0 standard drinks of alcohol    Drug use: No   Sexual activity: Not Currently  Other Topics Concern   Not on file  Social History Narrative   Not on file   Social Drivers of Health   Financial Resource Strain: Low Risk  (12/26/2020)   Received from [facility]   Overall Financial Resource Strain (CARDIA)    Difficulty of Paying  Living Expenses: Not very hard  Food Insecurity: No Food Insecurity (07/03/2023)   Hunger Vital Sign    Worried About Running Out of Food in the Last Year: Never true    Ran Out of Food in the Last Year: Never true  Transportation Needs: Unmet Transportation Needs (07/03/2023)   PRAPARE - Administrator, Civil Service (Medical): Yes    Lack of Transportation (Non-Medical): Yes  Physical Activity: Not on file  Stress: Not on file  Social Connections: Unknown (07/03/2023)   Social Connection and Isolation Panel    Frequency of Communication with Friends and Family: Not on file    Frequency of Social Gatherings with Friends and Family: Not on file    Attends Religious Services: Not on file    Active Member of Clubs or Organizations: Not on file    Attends Club or Organization Meetings: Never    Marital Status: Not on file  Recent Concern: Social Connections - Socially Isolated (07/03/2023)   Social Connection and Isolation Panel    Frequency of Communication with Friends and Family: Three times a week    Frequency of Social Gatherings with Friends and Family: Three times a week    Attends Religious Services: Never    Active Member of Clubs or Organizations: No    Attends Banker Meetings: Not on file    Marital Status: Divorced  Intimate Partner Violence: Not At Risk (07/03/2023)   Humiliation, Afraid, Rape, and Kick questionnaire    Fear of Current or Ex-Partner: No    Emotionally Abused: No    Physically Abused: No    Sexually Abused: No    Family History:    Family History  Problem Relation Age of Onset   Hypertension Father    Deep vein thrombosis Father    Dementia Mother    Diabetes Sister  Congestive Heart Failure Sister    Congestive Heart Failure Brother    Congestive Heart Failure Daughter    Congestive Heart Failure Son    Breast cancer Maternal Aunt        great aunt     ROS:  Please see the history of present illness.  Physical  Exam/Data: Vitals:   07/07/23 0630 07/07/23 0645 07/07/23 0700 07/07/23 0800  BP: 103/62 (!) 120/102 112/75 112/77  Pulse: (!) 182 (!) 158 (!) 161 94  Resp: (!) 22 16 19 17   Temp:    98.3 F (36.8 C)  TempSrc:      SpO2: 100% 96% 99% 100%  Weight:      Height:        Intake/Output Summary (Last 24 hours) at 07/07/2023 1232 Last data filed at 07/07/2023 0527 Gross per 24 hour  Intake 4491.63 ml  Output 800 ml  Net 3691.63 ml      07/06/2023    5:00 AM 07/01/2023    8:14 PM 03/26/2023    7:20 AM  Last 3 Weights  Weight (lbs) 248 lb 7.3 oz 274 lb 4.8 oz 95 lb 10.9 oz  Weight (kg) 112.7 kg 124.422 kg 43.4 kg     Body mass index is 37.78 kg/m.  General: Somnolent on exam, occasionally opening eyes to verbal stimulation HEENT: normal Neck: no JVD Vascular: No carotid bruits; Distal pulses 2+ bilaterally Cardiac:  normal S1, S2; RRR; no murmur  Lungs:  clear to auscultation anteriorly Abd: soft, nontender, no hepatomegaly  Ext: no edema Skin: warm and dry  Psych: Unable to assess  EKG:  The EKG from 6/25 was personally reviewed and demonstrates:  SVT rate 157 bpm Telemetry:  Telemetry was personally reviewed and demonstrates:  sinus rhythm with PVCs and intermittent bouts of SVT lasting less than one hour  Relevant CV Studies:  07/02/2023 Echo complete 1. Left ventricular ejection fraction, by estimation, is 45 to 50%. The  left ventricle has mildly decreased function. The left ventricle  demonstrates global hypokinesis. The left ventricular internal cavity size  was mildly dilated. There is mild  concentric left ventricular hypertrophy. Left ventricular diastolic  parameters are consistent with Grade I diastolic dysfunction (impaired  relaxation).   2. Right ventricular systolic function is normal. The right ventricular  size is normal.   3. A small pericardial effusion is present. The pericardial effusion is  posterior to the left ventricle.   4. The mitral valve is  grossly normal. Trivial mitral valve  regurgitation.   5. The aortic valve is grossly normal. Aortic valve regurgitation is not  visualized.   Laboratory Data: High Sensitivity Troponin:   Recent Labs  Lab 07/01/23 1936  TROPONINIHS 30*     Chemistry Recent Labs  Lab 07/02/23 0238 07/03/23 0510 07/05/23 2336 07/06/23 1017 07/07/23 0318  NA 134*   < > 136 138 135  K 3.4*   < > 3.1* 2.8* 3.2*  CL 104   < > 105 107 106  CO2 26   < > 24 25 21*  GLUCOSE 88   < > 90 75 70  BUN 13   < > 22 19 16   CREATININE 0.66   < > 0.88 0.83 0.70  CALCIUM  6.9*   < > 7.4* 7.4* 7.6*  MG 1.9  --   --   --  1.7  GFRNONAA >60   < > >60 >60 >60  ANIONGAP 4*   < > 7 6 8    < > =  values in this interval not displayed.    Recent Labs  Lab 07/05/23 2336 07/06/23 1017 07/07/23 0318  PROT 4.3* 4.2*  --   ALBUMIN  <1.5* <1.5* <1.5*  AST 24 23  --   ALT 16 14  --   ALKPHOS 123 122  --   BILITOT 0.3 0.6  --    Lipids No results for input(s): CHOL, TRIG, HDL, LABVLDL, LDLCALC, CHOLHDL in the last 168 hours.  Hematology Recent Labs  Lab 07/05/23 2336 07/06/23 1017 07/07/23 0318  WBC 11.2* 10.7* 8.3  RBC 3.21* 3.37* 3.48*  HGB 8.7* 8.9* 9.3*  HCT 26.0* 27.0* 27.6*  MCV 81.0 80.1 79.3*  MCH 27.1 26.4 26.7  MCHC 33.5 33.0 33.7  RDW 18.4* 18.6* 18.3*  PLT 492* 460* 494*   Thyroid  No results for input(s): TSH, FREET4 in the last 168 hours.  BNPNo results for input(s): BNP, PROBNP in the last 168 hours.  DDimer No results for input(s): DDIMER in the last 168 hours.  Radiology/Studies:  MR BRAIN W WO CONTRAST Result Date: 07/06/2023 IMPRESSION: Moderate to severely motion limited study without evidence of obvious acute abnormality. Electronically Signed   By: Gilmore GORMAN Molt M.D.   On: 07/06/2023 17:18   DG FL GUIDED LUMBAR PUNCTURE Result Date: 07/06/2023 IMPRESSION: Technically successful lumbar puncture under fluoroscopy. This exam was performed by Sherrilee Bal,  PA-C, and was supervised and interpreted by Dr. KANDICE Banner. Electronically Signed   By: Cordella Banner   On: 07/06/2023 13:19   CT HEAD WO CONTRAST ( ) Result Date: 07/06/2023 IMPRESSION: 1. No acute intracranial abnormality. 2. Mild chronic microvascular ischemic disease. Electronically Signed   By: Morene Hoard M.D.   On: 07/06/2023 04:09   DG Chest Port 1 View Result Date: 07/06/2023 IMPRESSION: Left basilar atelectasis. Mild vascular congestion without edema. Electronically Signed   By: Oneil Devonshire M.D.   On: 07/06/2023 00:21   EEG adult Result Date: 07/05/2023 IMPRESSION: This study is suggestive of moderate diffuse encephalopathy typically related to toxic-metabolic causes like cefepime  toxicity. No seizures or epileptiform discharges were seen throughout the recording. Patient was noted to have subtle whole body twitch at 1339 without concomitant EEG change. This was a NON epileptic event. Arlin MALVA Krebs   EEG adult Result Date: 07/05/2023 IMPRESSION: This study is suggestive of mild diffuse encephalopathy. No seizures or epileptiform discharges were seen throughout the recording. Priyanka MALVA Krebs   Assessment and Plan:  Supraventricular tachycardia - Patient with history of SVT on amiodarone  PTA - Telemetry reviewed and shows intermittent episodes of SVT with rates up to 150s bpm, lasting less than one hour - No evidence of atrial fibrillation on telemetry. No history of atrial fibrillation found on chart review. On Eliquis  for history of DVT/PE. - Recommend IV amiodarone  load and infusion with plan to transition back to PTA amiodarone  once patient is able to tolerate oral medications  Severe sepsis due to UTI Stage III sacral decubitus ulcer - Sepsis criteria met with leukocytosis, tachycardia, and tachypnea with severe lactic acidosis - Ongoing wound management for decubitus ulcer - Management of antibiotics per IM and ID  Acute metabolic encephalopathy - Patient  is somnolent on exam and opens eyes intermittently to verbal and physical stimuli - Ongoing management per IM and neurology  Hx DVT/PE - Continued on Eliquis   For questions or updates, please contact [facility] Please consult [email or web address] for contact info under    Signed, Lesley LITTIE Maffucci, PA-C  07/07/2023 12:32 PM

## 2023-07-07 NOTE — Progress Notes (Signed)
 Nutrition Follow-up  DOCUMENTATION CODES:   Morbid obesity  INTERVENTION:   -RD will follow for diet advancement and add supplements as appropriate -Consider placement of NGT for nutrition if mental status does not improve/ pt remains NPO. Recommend (recommendation discussed with MD):   Initiate Osmolite 1.5 @ 20 ml/hr and increase by 10 ml every 8 hours to goal rate of 50 ml/hr.   60 ml Prosource TF20 daily  Tube feeding regimen provides 1880 kcal (100% of needs), 95 grams of protein, and 914 ml of H2O.  Total free water : 1094 ml daily  -If NGT is purused, recommend:  -500 mg vitamin C  BID -220 mg zinc  sulfate daily 14 days -MVI with minerals daily -100 mg thiamine  daily x 7 days -Monitor Mg, K, and Phos daily and replete as needed secondary to high refeeding risk -Due to chronic wounds, RD will check copper  level for signs of deficiency, secondary to zinc  use  NUTRITION DIAGNOSIS:   Increased nutrient needs related to wound healing as evidenced by estimated needs.  Ongoing  GOAL:   Patient will meet greater than or equal to 90% of their needs  Unmet  MONITOR:   PO intake, Supplement acceptance  REASON FOR ASSESSMENT:   Malnutrition Screening Tool    ASSESSMENT:   Pt with medical history significant for asthma, osteoarthritis, giant cell arthritis, combined systolic and diastolic CHF, COPD, DVT, PE and atrial fibrillation on Eliquis , who presented with acute onset of increased weakness.  6/23- s/p EEG- revealed moderate diffuse encephalopathy typically related to toxic-metabolic causes like cefepime  toxicity, rapid response- transferred to ICU 6/24- LP attempted- no return of CSF fluid  Reviewed I/O's: +3.5 L x 24 hours and -1.5 L since admission  UOP: 1 L x 24 hours  Per [facility] notes, pt with stage 3 pressure injury to sacrum; suspect source of infection per consulting MD. [facility] consulted RD for wound healing.   Pt in with MD at time of visit. Pt remains  lethargic and disoriented x 4. Pt is NPO.   Per MD notes, follow-up for MRI of brain; will de-escalate antibiotics if negative of meningitis.   Case discussed with MD; discussed concern over NPO status and ability to take PO's. Recommending potential NGT placement for nutrition if mental status does not improve.   Per TOC notes, pt sister prefers to take pt home at discharge.   Medications reviewed and include solu-cortef , keppra, and lactated ringers  infusion @ 75 ml/hr.   Labs reviewed. Copper  level pending.    Diet Order:   Diet Order             Diet NPO time specified  Diet effective now                   EDUCATION NEEDS:   Education needs have been addressed  Skin:  Skin Assessment: Skin Integrity Issues: Skin Integrity Issues:: Stage III Stage III: sacrum  Last BM:  07/04/23 (via colostomy)  Height:   Ht Readings from Last 1 Encounters:  07/01/23 5' 8 (1.727 m)    Weight:   Wt Readings from Last 1 Encounters:  07/06/23 112.7 kg    Ideal Body Weight:  63.6 kg  BMI:  Body mass index is 37.78 kg/m.  Estimated Nutritional Needs:   Kcal:  1900-2100  Protein:  95-110 grams  Fluid:  1.9-2.1 L    Margery ORN, RD, LDN, CDCES Registered Dietitian III Certified Diabetes Care and Education Specialist If unable to reach this RD,  please use RD Inpatient group chat on secure chat between hours of 8am-4 pm daily

## 2023-07-07 NOTE — Progress Notes (Addendum)
       CROSS COVER NOTE  NAME: Wendy Sanchez MRN: [id number] DOB : 1950-09-18    Concern as stated by nurse / staff   Tachyarrhythmia sustaining over 150, intermittently up to 180.  Metoprolol  5 mg given without effect.  BP 102/55     Pertinent findings on chart review:    07/07/2023    6:30 AM 07/07/2023    6:25 AM 07/07/2023    5:00 AM  Vitals with BMI  Systolic 103 108 878  Diastolic 62 71 80  Pulse 182 114 88     Patient Assessment   Assessment and  Interventions   Assessment:  Tachycardia-history of SVT/A-fib Soft BP  Plan: Digoxin 0.25 IV every 2 as needed x 2 doses Consider cardiology consult in the a.m. X

## 2023-07-07 NOTE — Progress Notes (Signed)
 PHARMACY CONSULT NOTE - FOLLOW UP  Pharmacy Consult for Electrolyte Monitoring and Replacement   Recent Labs: Potassium (mmol/L)  Date Value  07/07/2023 3.2 (L)   Magnesium  (mg/dL)  Date Value  93/74/7974 1.7   Calcium  (mg/dL)  Date Value  93/74/7974 7.6 (L)   Albumin  (g/dL)  Date Value  93/74/7974 <1.5 (L)  02/25/2017 3.7   Phosphorus (mg/dL)  Date Value  93/74/7974 2.5   Sodium (mmol/L)  Date Value  07/07/2023 135  04/11/2020 140    Assessment:  73 y.o. African-American female with medical history significant for asthma, osteoarthritis, giant cell arthritis, combined systolic and diastolic CHF, COPD, DVT, PE and atrial fibrillation on Eliquis , who presented to the emergency room with acute onset of increased weakness. Pharmacy is asked to follow and replace electrolytes while in CCU  Diuretics: torsemide  10 mg po once daily  Goal of Therapy:  Electrolytes WNL  Plan:  ---10 mEq IV KCl x 3 ---2 grams IV magnesium  sulfate x 1 ---recheck electrolytes in am  Wendy Sanchez ,PharmD Clinical Pharmacist 07/07/2023 7:08 AM

## 2023-07-07 NOTE — Progress Notes (Signed)
 INFECTIOUS DISEASE PROGRESS NOTE Date of Admission:  07/01/2023     ID: Wendy Sanchez is a 73 y.o. female with sepsis, ams Principal Problem:   Sepsis due to gram-negative UTI (HCC) Active Problems:   Chronic obstructive pulmonary disease (COPD) (HCC)   Lower urinary tract infection   Hypotension   Peripheral neuropathy   Syncope   Paroxysmal atrial fibrillation (HCC)   GERD without esophagitis   Asthma-COPD overlap syndrome (HCC)   Reactive thrombocytosis   Adrenal crisis (HCC)   Weakness   Encephalopathy   Subjective: No fevers, wbc down. Episodes of tachycarida. MRI poor study but nothing acute. Remains min responsive  ROS  Unable to obtain  Medications:  Antibiotics Given (last 72 hours)     Date/Time Action Medication Dose Rate   07/04/23 2048 New Bag/Given   ceFEPIme  (MAXIPIME ) 2 g in sodium chloride  0.9 % 100 mL IVPB 2 g 200 mL/hr   07/05/23 0110 New Bag/Given   vancomycin  (VANCOREADY) IVPB 1750 mg/350 mL 1,750 mg 175 mL/hr   07/05/23 0418 New Bag/Given   ceFEPIme  (MAXIPIME ) 2 g in sodium chloride  0.9 % 100 mL IVPB 2 g 200 mL/hr   07/05/23 1412 New Bag/Given   ceFEPIme  (MAXIPIME ) 2 g in sodium chloride  0.9 % 100 mL IVPB 2 g 200 mL/hr   07/05/23 2045 New Bag/Given   ceFEPIme  (MAXIPIME ) 2 g in sodium chloride  0.9 % 100 mL IVPB 2 g 200 mL/hr   07/05/23 2235 New Bag/Given   acyclovir (ZOVIRAX) 880 mg in dextrose  5 % 250 mL IVPB 880 mg 267.6 mL/hr   07/05/23 2245 New Bag/Given   ampicillin (OMNIPEN) 2 g in sodium chloride  0.9 % 100 mL IVPB 2 g 300 mL/hr   07/05/23 2351 New Bag/Given  [patient in another room prior to this RN assuming care]   cefTRIAXone  (ROCEPHIN ) 2 g in sodium chloride  0.9 % 100 mL IVPB 2 g 200 mL/hr   07/06/23 0040 New Bag/Given   vancomycin  (VANCOCIN ) IVPB 1000 mg/200 mL premix 1,000 mg 200 mL/hr   07/06/23 0159 New Bag/Given   ampicillin (OMNIPEN) 2 g in sodium chloride  0.9 % 100 mL IVPB 2 g 300 mL/hr   07/06/23 0521 New Bag/Given    ampicillin (OMNIPEN) 2 g in sodium chloride  0.9 % 100 mL IVPB 2 g 300 mL/hr   07/06/23 0556 New Bag/Given   acyclovir (ZOVIRAX) 880 mg in dextrose  5 % 250 mL IVPB 880 mg 267.6 mL/hr   07/06/23 0959 New Bag/Given   ampicillin (OMNIPEN) 2 g in sodium chloride  0.9 % 100 mL IVPB 2 g 300 mL/hr   07/06/23 1053 New Bag/Given   cefTRIAXone  (ROCEPHIN ) 2 g in sodium chloride  0.9 % 100 mL IVPB 2 g 200 mL/hr   07/06/23 1252 New Bag/Given   vancomycin  (VANCOCIN ) IVPB 1000 mg/200 mL premix 1,000 mg 200 mL/hr   07/06/23 1417 New Bag/Given   meropenem  (MERREM ) 2 g in sodium chloride  0.9 % 100 mL IVPB 2 g 280 mL/hr   07/06/23 1527 New Bag/Given   acyclovir (ZOVIRAX) 880 mg in dextrose  5 % 250 mL IVPB 880 mg 267.6 mL/hr   07/06/23 2149 New Bag/Given   acyclovir (ZOVIRAX) 880 mg in dextrose  5 % 250 mL IVPB 880 mg 267.6 mL/hr   07/06/23 2256 New Bag/Given   meropenem  (MERREM ) 2 g in sodium chloride  0.9 % 100 mL IVPB 2 g 280 mL/hr   07/06/23 2345 New Bag/Given   vancomycin  (VANCOCIN ) IVPB 1000 mg/200 mL premix 1,000 mg  200 mL/hr   07/07/23 0526 New Bag/Given   acyclovir (ZOVIRAX) 880 mg in dextrose  5 % 250 mL IVPB 880 mg 267.6 mL/hr   07/07/23 0642 New Bag/Given   meropenem  (MERREM ) 2 g in sodium chloride  0.9 % 100 mL IVPB 2 g 280 mL/hr   07/07/23 1225 New Bag/Given   vancomycin  (VANCOCIN ) IVPB 1000 mg/200 mL premix 1,000 mg 200 mL/hr   07/07/23 1337 New Bag/Given   meropenem  (MERREM ) 2 g in sodium chloride  0.9 % 100 mL IVPB 2 g 280 mL/hr       Chlorhexidine  Gluconate Cloth  6 each Topical Daily   hydrocortisone  sod succinate (SOLU-CORTEF ) inj  100 mg Intravenous Q8H   lactose free nutrition  237 mL Oral TID WC   levETIRAcetam  1,000 mg Intravenous Q12H   liver oil-zinc  oxide   Topical BID   metoprolol  succinate  12.5 mg Oral Daily   midodrine   5 mg Oral TID AC   nystatin  cream   Topical BID   pantoprazole  (PROTONIX ) IV  40 mg Intravenous QHS   torsemide   10 mg Oral Daily    Objective: Vital  signs in last 24 hours: Temp:  [97.7 F (36.5 C)-98.3 F (36.8 C)] 97.7 F (36.5 C) (06/25 1300) Pulse Rate:  [56-182] 94 (06/25 1300) Resp:  [13-25] 13 (06/25 1300) BP: (92-127)/(47-102) 120/68 (06/25 1300) SpO2:  [96 %-100 %] 100 % (06/25 1300)   Constitutional:  chronically ill appearing, min responsive but opens eyes Cardiovascular: Normal rate, regular rhythm and normal heart sounds.  Pulmonary/Chest: Effort normal and breath sounds normal. No respiratory distress.  has no wheezes.  Neck = supple, no nuchal rigidity Abdominal: Soft. Bowel sounds are normal.  exhibits no distension. There is no tenderness.  Lymphadenopathy: no cervical adenopathy. No axillary adenopathy Neurological: confused, + myoclonus Skin:    Lab Results Recent Labs    07/06/23 1017 07/07/23 0318  WBC 10.7* 8.3  HGB 8.9* 9.3*  HCT 27.0* 27.6*  NA 138 135  K 2.8* 3.2*  CL 107 106  CO2 25 21*  BUN 19 16  CREATININE 0.83 0.70    Microbiology: @micro @ Studies/Results: MR BRAIN W WO CONTRAST Result Date: 07/06/2023 CLINICAL DATA:  Meningitis/CNS infection suspected EXAM: MRI HEAD WITHOUT AND WITH CONTRAST TECHNIQUE: Multiplanar, multiecho pulse sequences of the brain and surrounding structures were obtained without and with intravenous contrast. CONTRAST:  10mL GADAVIST  GADOBUTROL  1 MMOL/ML IV SOLN COMPARISON:  CT head from earlier today. FINDINGS: Moderate to severely motion limited evaluation. Within this limitation: Brain: No obvious acute infarction, hemorrhage, hydrocephalus, extra-axial collection or mass lesion. No pathologic enhancement. Vascular: Major arterial flow voids are maintained at the skull base. Skull and upper cervical spine: Normal marrow signal. Sinuses/Orbits: No obvious acute abnormality. Other: No mastoid effusions. IMPRESSION: Moderate to severely motion limited study without evidence of obvious acute abnormality. Electronically Signed   By: Gilmore GORMAN Molt M.D.   On: 07/06/2023  17:18   DG FL GUIDED LUMBAR PUNCTURE Result Date: 07/06/2023 CLINICAL DATA:  73 year old female who presented to the ED with weakness and concern for possible UTI on 6/19. Patient has gradually become more altered throughout her hospital stay. EEGs do not show any epileptiform waves, but are consistent with encephalopathy. EXAM: LUMBAR PUNCTURE UNDER FLUOROSCOPY PROCEDURE: An appropriate skin entry site was determined fluoroscopically. Operator donned sterile gloves and mask. Skin site was marked, then prepped with Betadine, draped in usual sterile fashion, and infiltrated locally with 1% lidocaine . A 7 inch 22 gauge  spinal needle advanced into the thecal sac at L5-S1 from a left interlaminar approach. Imaging was reassuring of appropriate needle positioning, but there was no CSF return. A second attempt was then made at the level of L2-3 from a left interlaminar approach. A small amount of CSF return was noted in the hub when the patient coughed, but quickly disappeared. The needle was rotated to facilitate fluid return, but unfortunately no CSF fluid was able to be obtained. The needle was then removed. The patient tolerated the procedure well and there were no complications. FLUOROSCOPY: Radiation Exposure Index (as provided by the fluoroscopic device): 39.6 mGy Kerma IMPRESSION: Technically successful lumbar puncture under fluoroscopy. This exam was performed by Sherrilee Bal, PA-C, and was supervised and interpreted by Dr. KANDICE Banner. Electronically Signed   By: Cordella Banner   On: 07/06/2023 13:19   CT HEAD WO CONTRAST ( ) Result Date: 07/06/2023 CLINICAL DATA:  Initial evaluation for acute meningitis/CNS infection suspected. EXAM: CT HEAD WITHOUT CONTRAST TECHNIQUE: Contiguous axial images were obtained from the base of the skull through the vertex without intravenous contrast. RADIATION DOSE REDUCTION: This exam was performed according to the departmental dose-optimization program which includes  automated exposure control, adjustment of the mA and/or kV according to patient size and/or use of iterative reconstruction technique. COMPARISON:  Prior CT from 07/01/2023. FINDINGS: Brain: Cerebral volume within normal limits. Mild chronic microvascular ischemic disease involving the supratentorial cerebral Bignell matter noted. No acute intracranial hemorrhage. No acute large vessel territory infarct. No mass lesion or midline shift. Ventricles normal size without hydrocephalus. No extra-axial fluid collection. Vascular: No abnormal hyperdense vessel. Calcified atherosclerosis of positive up the skull base. Skull: Scalp soft tissues within normal limits.  Calvarium intact. Sinuses/Orbits: Globes and orbital soft tissues within normal limits. Paranasal sinuses and mastoid air cells are largely clear. Other: None. IMPRESSION: 1. No acute intracranial abnormality. 2. Mild chronic microvascular ischemic disease. Electronically Signed   By: Morene Hoard M.D.   On: 07/06/2023 04:09   DG Chest Port 1 View Result Date: 07/06/2023 CLINICAL DATA:  Possible sepsis EXAM: PORTABLE CHEST 1 VIEW COMPARISON:  03/17/2023 FINDINGS: Cardiac shadow is enlarged but stable. Aortic calcifications are seen. Mild central vascular congestion is noted. Left basilar density is noted consistent with atelectasis. No bony abnormality is noted. IMPRESSION: Left basilar atelectasis. Mild vascular congestion without edema. Electronically Signed   By: Oneil Devonshire M.D.   On: 07/06/2023 00:21   EEG adult Result Date: 07/05/2023 Shelton Arlin KIDD, MD     07/05/2023  3:30 PM Patient Name: CRIS GIBBY MRN: [id number] Epilepsy Attending: Arlin KIDD Shelton Referring Physician/Provider: Dorinda Drue ONEIDA, MD Date: 07/05/2023 Duration: 24.36 mins Patient history: 73yo f with ams. EEG to evaluate for seizure Level of alertness: Awake AEDs during EEG study: None Technical aspects: This EEG study was done with scalp electrodes positioned according  to the 10-20 International system of electrode placement. Electrical activity was reviewed with band pass filter of 1-70Hz , sensitivity of 7 uV/mm, display speed of 62mm/sec with a 60Hz  notched filter applied as appropriate. EEG data were recorded continuously and digitally stored.  Video monitoring was available and reviewed as appropriate. Description: EEG showed continuous generalized polymorphic 3 to 6 Hz theta-delta slowing. Generalized periodic discharges with triphasic morphology at  0.5-1 Hz were also noted. Patient was noted to have subtle whole body twitch at 1339.  Concomitant EEG before, during and after the event did not show any EEG changes to suggest seizure.  Hyperventilation  and photic stimulation were not performed.   ABNORMALITY - Periodic discharges with triphasic morphology, generalized ( GPDs) - Continuous slow, generalized IMPRESSION: This study is suggestive of moderate diffuse encephalopathy typically related to toxic-metabolic causes like cefepime  toxicity. No seizures or epileptiform discharges were seen throughout the recording. Patient was noted to have subtle whole body twitch at 1339 without concomitant EEG change. This was a NON epileptic event. Priyanka MALVA Krebs    Assessment/Plan: Wendy Sanchez is a 73 y.o. female chronically quite ill woman who is bedbound and has contractures and a colostomy as well as a Foley admitted June 19 with acute onset of weakness.  She has a history of asthma osteoarthritis giant cell arteritis CHF COPD DVT PE A-fib on Eliquis .  She has a chronic Foley and it was noted to have sediment in it.  She also has a sacral wound that was tunneling deeper.  In the ED she was hypotensive but alert.  She was afebrile.  She had had a Herringshaw count of 12 normal renal function lactate of 1.7 anemia with hemoglobin of 9.3 elevated platelets.  Viral respiratory panel was negative.  Urinalysis showed 21-50 Ab[place] cells.  Imaging initially showed CT of the head  generalized cerebral atrophy but negative otherwise.  She was started on cefepime  and vancomycin  June 19.  She then had what appeared to be either a syncopal episode or seizure in the ED. She has been seen by neurology and is undergoing a lumbar puncture.  She had carotid Dopplers done At baseline she is bedbound has chronic ankle contractures plantarflexion and has a colostomy as well as a Foley catheter.  She was at [employer] before coming home where her sister cares for her   Microdata reviewed and urine culture from her Foley catheter grew E. coli, Serratia and Pseudomonas.  These were all sensitive to cefepime  which she is continued on. I suspect the neuro changes are cefepime  toxicity. Doubt meningitis but LP cannot be done.    6/25- wbc down. Still encephalopathic.  Recommendations For now continue meropenem , vanco and acyclovir. LP cannot be done.    HX C diff at last admission no diarrhea  Thank you very much for the consult. Will follow with you.  Alm SHAUNNA Needle   07/07/2023, 2:20 PM

## 2023-07-07 NOTE — Progress Notes (Signed)
 Progress Note   Patient: Wendy Sanchez FMW:[id number] DOB: 06/22/1950 DOA: 07/01/2023     6 DOS: the patient was seen and examined on 07/07/2023   Brief hospital course: From HPI Wendy Sanchez is a 73 y.o. African-American female with medical history significant for asthma, osteoarthritis, giant cell arthritis, combined systolic and diastolic CHF, COPD, DVT, PE and atrial fibrillation on Eliquis , who presented to the emergency room with acute onset of increased weakness.  Patient was hypotensive upon arriving to hospital, she also had a syncope due to hypotension. Patient also has tachycardia, tachypnea and leukocytosis.  She also has severe altered mental status, EEG did not show any evidence of seizure.  MRI of the brain poor quality, but did not show any acute changes.  LP was performed, could not collect any CSF. Her urine culture grow E. coli Serratia and Pseudomonas.  Patient was placed on broad-spectrum antibiotics.     Principal Problem:   Sepsis due to gram-negative UTI [facility]) Active Problems:   Syncope   Hypotension   Chronic obstructive pulmonary disease (COPD) (HCC)   Peripheral neuropathy   Paroxysmal atrial fibrillation (HCC)   GERD without esophagitis   Asthma-COPD overlap syndrome (HCC)   Reactive thrombocytosis   Adrenal crisis (HCC)   Assessment and Plan: Severe sepsis due to gram-negative UTI (HCC) Gram-negative urinary tract infection. Stage III sacral decubitus ulcer - Sepsis manifested by leukocytosis, tachycardia and tachypnea.  Patient also had a severe lactic acidosis of 4.5. Patient received sepsis fluid in the emergency room Sepsis also compounded by decubitus sacral ulcer Wound care on board; we appreciate input Culture results showing Pseudomonas in the wound, urine cultures growing E. coli, Serratia as well as Pseudomonas Patient is seen by infectious disease, currently treated with meropenem  and vancomycin .  Will continue to follow. Patient  condition still critical, will continue monitor closely in the ICU stepdown unit   Acute metabolic encephalopathy rule out possible bacterial meningitis or encephalitis Noted to have some tremors involving the right face and legs as well as bilateral upper extremities EEG obtained that did not show any epileptiform waves Patient initiated on Keppra and neurology was consulted LP was attempted by IR today however unsuccessful Currently patient on vancomycin , meropenem  and acyclovir According to ID pharmacist meropenem  will also cover Listeria (no need for ampicillin) MRI of the brain with and without contrast showed a moderate motion artifact, but no apparent acute changes. Currently, patient is very confused, mental status does not seem to be improving. Due to altered mental status, not able to start any diet.  Will ask neurology to see the patient again.    Syncope likely secondary to hypotension Adrenal crisis. Hypoglycemia secondary to adrenal crisis Continue neurochecks closely Echo showed EF 45 to 50% with grade 1 diastolic dysfunction Patient not fluid overloaded Patient had cortisol level of 2.5, she also had significant hypotension, hypoglycemia.  She has adrenal crisis.  She was chronically taking lower dose of Cortef , will start stress dose hydrocortisone  at 100 mg every 8 hours.   Hypokalemia Continue repletion and monitoring    Chronic obstructive pulmonary disease (COPD) (HCC) ABG did not show any evidence of CO2 retention. Continue her as needed DuoNebs.   Peripheral neuropathy Continue to hold Neurontin  given encephalopathy   GERD without esophagitis Continue PPI therapy   Paroxysmal atrial fibrillation (HCC) with RVR. Discussed with cardiology, will be seen by cardiology today.   Class II obesity with BMI 37.78. Complicated patient care as well as prognosis  Subjective:  Patient is very confused, only opening eyes to verbal command.  Physical  Exam: Vitals:   07/07/23 0630 07/07/23 0645 07/07/23 0700 07/07/23 0800  BP: 103/62 (!) 120/102 112/75 112/77  Pulse: (!) 182 (!) 158 (!) 161 94  Resp: (!) 22 16 19 17   Temp:    98.3 F (36.8 C)  TempSrc:      SpO2: 100% 96% 99% 100%  Weight:      Height:       General exam: Appears calm and comfortable, obese. Respiratory system: Clear to auscultation. Respiratory effort normal. Cardiovascular system: S1 & S2 heard, RRR. No JVD, murmurs, rubs, gallops or clicks. No pedal edema. Gastrointestinal system: Abdomen is nondistended, soft and nontender. No organomegaly or masses felt. Normal bowel sounds heard. Central nervous system: Drowsy and opening eyes to command. No focal neurological deficits. Extremities: Symmetric 5 x 5 power. Skin: No rashes, lesions or ulcers     Data Reviewed:  Reviewed CT scan results, lab results, MRI results.  Family Communication: Sister updated over the phone.  Disposition: Status is: Inpatient Remains inpatient appropriate because: Severity of disease, altered mental status, IV treatment.     CRITICAL CARE Performed by: Murvin Mana   Total critical care time: 60 minutes  Critical care time was exclusive of separately billable procedures and treating other patients.  Critical care was necessary to treat or prevent imminent or life-threatening deterioration.  Critical care was time spent personally by me on the following activities: development of treatment plan with patient and/or surrogate as well as nursing, discussions with consultants, evaluation of patient's response to treatment, examination of patient, obtaining history from patient or surrogate, ordering and performing treatments and interventions, ordering and review of laboratory studies, ordering and review of radiographic studies, pulse oximetry and re-evaluation of patient's condition.   Author: Murvin Mana, MD 07/07/2023 12:13 PM  For on call review [email or web address].

## 2023-07-07 NOTE — Progress Notes (Addendum)
 Pt;s HR up to 180's. PRN Lopressor  5 mg IV given without effect. Dr. Cleatus paged for HR sustaining in 160's. New order for Digoxin 0.25 IV given. EKG done. BP 112/75(87).

## 2023-07-08 ENCOUNTER — Inpatient Hospital Stay

## 2023-07-08 DIAGNOSIS — I471 Supraventricular tachycardia, unspecified: Secondary | ICD-10-CM | POA: Diagnosis not present

## 2023-07-08 DIAGNOSIS — N390 Urinary tract infection, site not specified: Secondary | ICD-10-CM | POA: Diagnosis not present

## 2023-07-08 DIAGNOSIS — E272 Addisonian crisis: Secondary | ICD-10-CM | POA: Diagnosis not present

## 2023-07-08 DIAGNOSIS — A4150 Gram-negative sepsis, unspecified: Secondary | ICD-10-CM | POA: Diagnosis not present

## 2023-07-08 DIAGNOSIS — G9341 Metabolic encephalopathy: Secondary | ICD-10-CM | POA: Insufficient documentation

## 2023-07-08 DIAGNOSIS — G9340 Encephalopathy, unspecified: Secondary | ICD-10-CM | POA: Diagnosis not present

## 2023-07-08 DIAGNOSIS — G039 Meningitis, unspecified: Secondary | ICD-10-CM | POA: Diagnosis not present

## 2023-07-08 DIAGNOSIS — A419 Sepsis, unspecified organism: Secondary | ICD-10-CM | POA: Diagnosis not present

## 2023-07-08 DIAGNOSIS — I480 Paroxysmal atrial fibrillation: Secondary | ICD-10-CM | POA: Diagnosis not present

## 2023-07-08 LAB — BASIC METABOLIC PANEL WITH GFR
Anion gap: 6 (ref 5–15)
BUN: 16 mg/dL (ref 8–23)
CO2: 24 mmol/L (ref 22–32)
Calcium: 7.8 mg/dL — ABNORMAL LOW (ref 8.9–10.3)
Chloride: 103 mmol/L (ref 98–111)
Creatinine, Ser: 0.92 mg/dL (ref 0.44–1.00)
GFR, Estimated: >60 mL/min (ref >60)
Glucose, Bld: 112 mg/dL — ABNORMAL HIGH (ref 70–99)
Potassium: 3.9 mmol/L (ref 3.5–5.1)
Sodium: 133 mmol/L — ABNORMAL LOW (ref 135–145)

## 2023-07-08 LAB — APTT
aPTT: 162 s (ref 24–36)
aPTT: 91 s — ABNORMAL HIGH (ref 24–36)
aPTT: 96 s — ABNORMAL HIGH (ref 24–36)

## 2023-07-08 LAB — HEPARIN LEVEL (UNFRACTIONATED): Heparin Unfractionated: 0.29 [IU]/mL — ABNORMAL LOW (ref 0.30–0.70)

## 2023-07-08 LAB — MAGNESIUM: Magnesium: 2 mg/dL (ref 1.7–2.4)

## 2023-07-08 LAB — GLUCOSE, CAPILLARY
Glucose-Capillary: 100 mg/dL — ABNORMAL HIGH (ref 70–99)
Glucose-Capillary: 127 mg/dL — ABNORMAL HIGH (ref 70–99)
Glucose-Capillary: 128 mg/dL — ABNORMAL HIGH (ref 70–99)
Glucose-Capillary: 130 mg/dL — ABNORMAL HIGH (ref 70–99)
Glucose-Capillary: 132 mg/dL — ABNORMAL HIGH (ref 70–99)
Glucose-Capillary: 156 mg/dL — ABNORMAL HIGH (ref 70–99)

## 2023-07-08 LAB — VANCOMYCIN, RANDOM: Vancomycin Rm: 37 ug/mL

## 2023-07-08 LAB — PHOSPHORUS: Phosphorus: 2.8 mg/dL (ref 2.5–4.6)

## 2023-07-08 LAB — COPPER, SERUM: Copper: 51 ug/dL — ABNORMAL LOW (ref 80–158)

## 2023-07-08 LAB — AMMONIA: Ammonia: <13 umol/L (ref 9–35)

## 2023-07-08 MED ORDER — VITAMIN C 500 MG PO TABS
500.0000 mg | ORAL_TABLET | Freq: Two times a day (BID) | ORAL | Status: DC
  Administered 2023-07-08 – 2023-07-13 (×11): 500 mg
  Filled 2023-07-08 (×11): qty 1

## 2023-07-08 MED ORDER — PROSOURCE TF20 ENFIT COMPATIBL EN LIQD
60.0000 mL | Freq: Every day | ENTERAL | Status: DC
  Administered 2023-07-08 – 2023-07-13 (×6): 60 mL
  Filled 2023-07-08 (×4): qty 60

## 2023-07-08 MED ORDER — HEPARIN (PORCINE) 25000 UT/250ML-% IV SOLN
1150.0000 [IU]/h | INTRAVENOUS | Status: DC
  Administered 2023-07-08: 1000 [IU]/h via INTRAVENOUS
  Administered 2023-07-09: 950 [IU]/h via INTRAVENOUS
  Administered 2023-07-10: 1050 [IU]/h via INTRAVENOUS
  Administered 2023-07-11: 1000 [IU]/h via INTRAVENOUS
  Filled 2023-07-08 (×4): qty 250

## 2023-07-08 MED ORDER — COPPER 2 MG PO TABS
2.0000 mg | Freq: Every day | Status: DC
  Administered 2023-07-09 – 2023-07-13 (×5): 2 mg
  Filled 2023-07-08 (×6): qty 1

## 2023-07-08 MED ORDER — QUETIAPINE FUMARATE 25 MG PO TABS
25.0000 mg | ORAL_TABLET | Freq: Every day | ORAL | Status: DC
  Administered 2023-07-08 – 2023-07-12 (×4): 25 mg via ORAL
  Filled 2023-07-08 (×4): qty 1

## 2023-07-08 MED ORDER — FREE WATER
30.0000 mL | Status: DC
  Administered 2023-07-08 – 2023-07-13 (×32): 30 mL

## 2023-07-08 MED ORDER — THIAMINE MONONITRATE 100 MG PO TABS
100.0000 mg | ORAL_TABLET | Freq: Every day | ORAL | Status: DC
  Administered 2023-07-08 – 2023-07-13 (×6): 100 mg
  Filled 2023-07-08 (×6): qty 1

## 2023-07-08 MED ORDER — OSMOLITE 1.5 CAL PO LIQD
1000.0000 mL | ORAL | Status: DC
  Administered 2023-07-08 – 2023-07-13 (×4): 1000 mL

## 2023-07-08 MED ORDER — ORAL CARE MOUTH RINSE
15.0000 mL | OROMUCOSAL | Status: DC
  Administered 2023-07-08 – 2023-07-13 (×22): 15 mL via OROMUCOSAL

## 2023-07-08 MED ORDER — ADULT MULTIVITAMIN W/MINERALS CH
1.0000 | ORAL_TABLET | Freq: Two times a day (BID) | ORAL | Status: DC
  Administered 2023-07-08 – 2023-07-13 (×11): 1
  Filled 2023-07-08 (×11): qty 1

## 2023-07-08 MED ORDER — ZINC SULFATE 220 (50 ZN) MG PO CAPS
220.0000 mg | ORAL_CAPSULE | Freq: Every day | ORAL | Status: DC
  Administered 2023-07-08 – 2023-07-13 (×6): 220 mg
  Filled 2023-07-08 (×6): qty 1

## 2023-07-08 MED ORDER — ORAL CARE MOUTH RINSE: 15.0000 mL | OROMUCOSAL | Status: DC | PRN

## 2023-07-08 MED ORDER — VITAL HIGH PROTEIN PO LIQD: 1000.0000 mL | ORAL | Status: DC

## 2023-07-08 MED ORDER — CALCIUM CARBONATE ANTACID 500 MG PO CHEW
1.0000 | CHEWABLE_TABLET | Freq: Three times a day (TID) | ORAL | Status: DC
  Administered 2023-07-08 – 2023-07-13 (×17): 200 mg
  Filled 2023-07-08 (×17): qty 1

## 2023-07-08 NOTE — Progress Notes (Signed)
 PHARMACY - ANTICOAGULATION CONSULT NOTE  Pharmacy Consult for heparin  infusion Indication: h/o DVT & PE  Allergies  Allergen Reactions   Cefepime  Other (See Comments)    Encephalopathy requiring transfer to ICU on 07/05/2023 (not allergy so may use other cephalosporins and beta lactams).     Hydrocodone  Swelling   Hydrocodone -Acetaminophen  Swelling    hands   Iodinated Contrast Media Anaphylaxis   Iodine Anaphylaxis and Swelling    Iv dye  02/03/21-Ispoke to patient and she had IV contrast  X4 last year and has had no problem. ( Last CT abdomen with contrast was on 01/02/21.-   Nitrofurantoin    Other Other (See Comments)    ALLERGY TO METAL - BLACKENS SKIN AND CAUSES A RASH   Tape Swelling and Other (See Comments)    Skin comes off.  Paper tape is ok tegaderm OK   Shellfish-Derived Products    Wound Dressing Adhesive    Peanut Oil Other (See Comments)    ** Nuts cause runny nose   Shellfish Allergy Nausea And Vomiting and Swelling    Episode of GI infection after eating clam chowder. Still eats shrimp and other seafood    Patient Measurements: Height: 5' 8 (172.7 cm) Weight: 112.7 kg (248 lb 7.3 oz) IBW/kg (Calculated) : 63.9 Heparin  Dosing Weight: 91.1 kg   Vital Signs: Temp: 99.7 F (37.6 C) (06/26 0000) Temp Source: Axillary (06/26 0000) BP: 125/96 (06/26 0000) Pulse Rate: 106 (06/26 0000)  Labs: Recent Labs    07/05/23 2336 07/06/23 1017 07/07/23 0318  HGB 8.7* 8.9* 9.3*  HCT 26.0* 27.0* 27.6*  PLT 492* 460* 494*  APTT 49*  --   --   LABPROT 21.2*  --   --   INR 1.8*  --   --   CREATININE 0.88 0.83 0.70    Estimated Creatinine Clearance: 83.7 mL/min (by C-G formula based on SCr of 0.7 mg/dL).   Medical History: Past Medical History:  Diagnosis Date   Allergic rhinitis    Arthritis    Asthma    problems with fumes and aerosols cause asthma   Chest pain 10/19/2016   CHF (congestive heart failure) (HCC)    Complication of anesthesia    awakens  during surgery; has occurred with last 3-4 surgeries    COPD (chronic obstructive pulmonary disease) (HCC)    DVT (deep venous thrombosis) (HCC) 07/13/2018   Right leg   Environmental allergies    fumes    Fibromyalgia    GERD (gastroesophageal reflux disease)    Headache    History of bronchitis    Hx of total knee arthroplasty 12/13/2015   Hyperlipidemia    Hypertension    Morbid obesity with BMI of 45.0-49.9, adult (HCC) 07/25/2015   NICM (nonischemic cardiomyopathy) (HCC)    a. ? PVC mediated;  b. 06/2016 Echo: EF 25-30%, mild LVH, mild LAE, mild AI/MR/TR/PR; c. 08/2016 Cath: nl cors, EF 25%.   Numbness    hands bilat when driving; improves when not preforming task    Osteoarthritis of left knee 08/02/2015   PE (pulmonary thromboembolism) (HCC) 07/13/2018   Pes anserinus bursitis of left knee 05/18/2016   Presence of right artificial knee joint 05/18/2016   PVC (premature ventricular contraction) 06/04/2017   PVC's (premature ventricular contractions)    a. 11/2016 Amio started;  b. 11/29/16 48h Holter: 57574 PVC's (41%); c. 12/2016 24h Holter: 18040 PVC's (15%); c. 02/2017 48h Holter: 37262 PVC's (41%).   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   Temporal arteritis (HCC) 05/01/2020   Tinnitus    comes and goes    Unilateral primary osteoarthritis, right knee 12/13/2015   Vertigo    none for over 2 yrs    Medications:  Scheduled:   Chlorhexidine  Gluconate Cloth  6 each Topical Daily   hydrocortisone  sod succinate (SOLU-CORTEF ) inj  100 mg Intravenous Q8H   lactose free nutrition  237 mL Oral TID WC   levETIRAcetam  1,000 mg Intravenous Q12H   liver oil-zinc  oxide   Topical BID   metoprolol  succinate  12.5 mg Oral Daily   midodrine   5 mg Oral TID AC   nystatin  cream   Topical BID   pantoprazole  (PROTONIX ) IV  40 mg Intravenous QHS   torsemide    10 mg Oral Daily   vancomycin  variable dose per unstable renal function (pharmacist dosing)   Does not apply See admin instructions   Infusions:   acyclovir 880 mg (07/07/23 2321)   amiodarone  30 mg/hr (07/07/23 2156)   fluconazole  (DIFLUCAN ) IV Stopped (07/07/23 1344)   lactated ringers  75 mL/hr at 07/07/23 1630   meropenem  (MERREM ) IV 2 g (07/07/23 2218)   promethazine  (PHENERGAN ) injection (IM or IVPB)      Assessment: 73 y.o. African-American female with medical history significant for asthma, osteoarthritis, giant cell arthritis, combined systolic and diastolic CHF, COPD, DVT, PE and atrial fibrillation on Eliquis , who presented to the emergency room with acute onset of increased weakness. Last dose of apixaban  07/05/23 0919   Goal of Therapy:  anti-Xa level 0.3-0.7 units/ml aPTT 66 - 102 seconds Monitor platelets by anticoagulation protocol: Yes  06/25 2314 aPTT 161 (verbal), supratherapeutic   Plan:  ---Contacted RN.  Hold infusion for 1 hour. ---Restart heparin  infusion at 1000 units/hr (rate based on data from previous admission) ---Recheck aPTT level in 8 hours after restart (we will use aPTT to guide therapy until aPTT and anti-Xa level correlate) ---Continue to monitor H&H and platelets  Rankin CANDIE Dills, PharmD, [facility] 07/08/2023 12:47 AM

## 2023-07-08 NOTE — Progress Notes (Signed)
 PHARMACY CONSULT NOTE - FOLLOW UP  Pharmacy Consult for Electrolyte Monitoring and Replacement   Recent Labs: Potassium (mmol/L)  Date Value  07/08/2023 3.9   Magnesium  (mg/dL)  Date Value  93/73/7974 2.0   Calcium  (mg/dL)  Date Value  93/73/7974 7.8 (L)   Albumin  (g/dL)  Date Value  93/74/7974 <1.5 (L)  02/25/2017 3.7   Phosphorus (mg/dL)  Date Value  93/74/7974 2.5   Sodium (mmol/L)  Date Value  07/08/2023 133 (L)  04/11/2020 140    Assessment:  73 y.o. African-American female with medical history significant for asthma, osteoarthritis, giant cell arthritis, combined systolic and diastolic CHF, COPD, DVT, PE and atrial fibrillation on Eliquis , who presented to the emergency room with acute onset of increased weakness. Pharmacy is asked to follow and replace electrolytes while in CCU  Diuretics: torsemide  10 mg po once daily  Goal of Therapy:  Electrolytes WNL  Plan:  ---no electrolyte replacement warranted for today ---recheck electrolytes in am  Adriana JONETTA Bolster ,PharmD Clinical Pharmacist 07/08/2023 7:09 AM

## 2023-07-08 NOTE — Progress Notes (Signed)
 NEUROLOGY CONSULT FOLLOW UP NOTE   Date of service: July 08, 2023 Patient Name: Wendy Sanchez MRN:  [id number] DOB:  01/17/1950  Interval Hx/subjective  Feeding tube has been placed. She continues to be severely encephalopathic.   Vitals   Vitals:   07/08/23 0630 07/08/23 0700 07/08/23 0800 07/08/23 0900  BP: 131/82 (!) 116/105 119/76 134/76  Pulse: (!) 111 (!) 109 (!) 109 (!) 110  Resp: (!) 27 (!) 27 17 19   Temp:      TempSrc:      SpO2: 100% 100% 100% 100%  Weight:      Height:         Body mass index is 37.78 kg/m.  Physical Exam   Constitutional: Ill-appearing, morbidly obese elderly female  Eyes: No scleral injection.  HENT: No OP obstruction.  Head: Normocephalic. Neck stiffness and pain in rotation and flexion seems to have improved somewhat since yesterday. Respiratory: Effort normal, non-labored breathing.  Ext: Pitting edema to feet bilaterally > pretibially   Neurological Examination Mental Status: Severely encephalopathic. Does not answer any questions or follow any commands. Does not respond to her name. Can be aroused to a brief, eyes-open state with sternal rub. Does not reliably blink to threat when eyelids are passively opened and does not make eye contact. Does not make any attempts to communicate. No longer moaning in pain when limbs are passively elevated and flexed at elbows, shoulders and knees, as was noted with prior exams.  Cranial Nerves: II: No reliable blink to threat. PERRL 5 mm >> 3 mm. III,IV, VI: Eyes are conjugate and at the midline. No nystagmus. Does not fixate or track.  V: Reacts to eyelid stimulation bilaterally VII: Grimaces symmetrically VIII: Does not respond to voice IX,X: Gag reflex deferred XI: Unable to assess XII: Unable to assess Motor/Sensory: BUE: Weak withdrawal to pinch equally bilaterally BLE: Weak withdrawal to pinch equally bilaterally Chronic flexion contractures to ankles bilaterally.  No myoclonus or tremor  noted.  Deep Tendon Reflexes: 1+ and symmetric bilateral brachioradialis. Unable to elicit patellar reflexes due to prior knee replacements Cerebellar: Unable to assess Gait: Unable to assess  Medications  Current Facility-Administered Medications:    acetaminophen  (TYLENOL ) tablet 650 mg, 650 mg, Oral, Q6H PRN, 650 mg at 07/05/23 0136 **OR** acetaminophen  (TYLENOL ) suppository 650 mg, 650 mg, Rectal, Q6H PRN, Mansy, Jan A, MD   acyclovir (ZOVIRAX) 880 mg in dextrose  5 % 250 mL IVPB, 10 mg/kg (Adjusted), Intravenous, Q8H, Kweku Stankey, MD, Stopped at 07/08/23 9375   albuterol  (PROVENTIL ) (2.5 MG/3ML) 0.083% nebulizer solution 2.5 mg, 2.5 mg, Nebulization, Q6H PRN, Mansy, Madison LABOR, MD   [COMPLETED] amiodarone  (NEXTERONE ) 1.8 mg/mL load via infusion 150 mg, 150 mg, Intravenous, Once, 150 mg at 07/07/23 0956 **FOLLOWED BY** [EXPIRED] amiodarone  (NEXTERONE  PREMIX) 360-4.14 MG/200ML-% (1.8 mg/mL) IV infusion, 60 mg/hr, Intravenous, Continuous, Stopped at 07/07/23 1630 **FOLLOWED BY** amiodarone  (NEXTERONE  PREMIX) 360-4.14 MG/200ML-% (1.8 mg/mL) IV infusion, 30 mg/hr, Intravenous, Continuous, Gollan, Timothy J, MD, Last Rate: 16.67 mL/hr at 07/08/23 0909, 30 mg/hr at 07/08/23 0909   Chlorhexidine  Gluconate Cloth 2 % PADS 6 each, 6 each, Topical, Daily, Djan, Prince T, MD, 6 each at 07/07/23 1000   digoxin (LANOXIN) 0.25 MG/ML injection 0.25 mg, 0.25 mg, Intravenous, Q2H PRN, Cleatus Hoof V, MD, 0.25 mg at 07/07/23 9352   diphenhydrAMINE  (BENADRYL ) capsule 25 mg, 25 mg, Oral, Q6H PRN, Mansy, Jan A, MD   fluconazole  (DIFLUCAN ) IVPB 200 mg, 200 mg, Intravenous, Q24H, Nada Adriana BIRCH,  RPH, Stopped at 07/07/23 1344   heparin  ADULT infusion 100 units/mL (25000 units/250mL), 1,000 Units/hr, Intravenous, Continuous, Dail Rankin RAMAN, RPH, Last Rate: 10 mL/hr at 07/08/23 0904, 1,000 Units/hr at 07/08/23 9095   hydrocortisone  sodium succinate  (SOLU-CORTEF ) 100 MG injection 100 mg, 100 mg, Intravenous, Q8H, Laurita Pillion, MD, 100 mg at 07/08/23 9478   HYDROmorphone  (DILAUDID ) injection 0.5 mg, 0.5 mg, Intravenous, Q4H PRN, Laurita Pillion, MD, 0.5 mg at 07/07/23 1056   lactated ringers  infusion, , Intravenous, Continuous, Laurita Pillion, MD, Last Rate: 75 mL/hr at 07/08/23 9373, Infusion Verify at 07/08/23 0626   lactose free nutrition (BOOST PLUS) liquid 237 mL, 237 mL, Oral, TID WC, Djan, Drue DASEN, MD   levETIRAcetam (KEPPRA) undiluted injection 1,000 mg, 1,000 mg, Intravenous, Q12H, Djan, Prince T, MD, 1,000 mg at 07/07/23 2154   liver oil-zinc  oxide (DESITIN) 40 % ointment, , Topical, BID, Mansy, Jan A, MD, Given at 07/07/23 2219   liver oil-zinc  oxide (DESITIN) 40 % ointment, , Topical, PRN, Mansy, Madison LABOR, MD   magnesium  hydroxide (MILK OF MAGNESIA) suspension 30 mL, 30 mL, Oral, Daily PRN, Mansy, Jan A, MD   melatonin tablet 5 mg, 5 mg, Oral, QHS PRN, Mansy, Jan A, MD   meropenem  (MERREM ) 2 g in sodium chloride  0.9 % 100 mL IVPB, 2 g, Intravenous, Q8H, Djan, Prince T, MD, Last Rate: 280 mL/hr at 07/08/23 0629, 2 g at 07/08/23 9370   metoprolol  succinate (TOPROL -XL) 24 hr tablet 12.5 mg, 12.5 mg, Oral, Daily, Djan, Prince T, MD, 12.5 mg at 07/05/23 1045   metoprolol  tartrate (LOPRESSOR ) injection 5 mg, 5 mg, Intravenous, Q6H PRN, Dorinda Drue DASEN, MD, 5 mg at 07/07/23 9367   nystatin  cream (MYCOSTATIN ), , Topical, BID, Dorinda Drue DASEN, MD, Given at 07/07/23 2219   ondansetron  (ZOFRAN ) tablet 4 mg, 4 mg, Oral, Q6H PRN **OR** ondansetron  (ZOFRAN ) injection 4 mg, 4 mg, Intravenous, Q6H PRN, Mansy, Jan A, MD, 4 mg at 07/04/23 1223   oxyCODONE -acetaminophen  (PERCOCET/ROXICET) 5-325 MG per tablet 1 tablet, 1 tablet, Oral, Q6H PRN, 1 tablet at 07/05/23 1123 **AND** oxyCODONE  (Oxy IR/ROXICODONE ) immediate release tablet 5 mg, 5 mg, Oral, Q6H PRN, Mansy, Jan A, MD, 5 mg at 07/03/23 2158   pantoprazole  (PROTONIX ) injection 40 mg, 40 mg, Intravenous, QHS, Nada Adriana BIRCH, RPH, 40 mg at 07/07/23 2154   polyethylene glycol  (MIRALAX  / GLYCOLAX ) packet 17 g, 17 g, Oral, Daily PRN, Mansy, Jan A, MD   promethazine  (PHENERGAN ) 12.5 mg in sodium chloride  0.9 % 50 mL IVPB, 12.5 mg, Intravenous, Q6H PRN, Dorinda, Drue DASEN, MD   QUEtiapine (SEROQUEL) tablet 25 mg, 25 mg, Oral, QHS, Zhang, Dekui, MD   torsemide  (DEMADEX ) tablet 10 mg, 10 mg, Oral, Daily, Mansy, Jan A, MD, 10 mg at 07/04/23 0957   vancomycin  variable dose per unstable renal function (pharmacist dosing), , Does not apply, See admin instructions, Dail Rankin RAMAN, [facility]  Labs and Diagnostic Imaging   CBC:  Recent Labs  Lab 07/06/23 1017 07/07/23 0318  WBC 10.7* 8.3  NEUTROABS 8.8* 6.7  HGB 8.9* 9.3*  HCT 27.0* 27.6*  MCV 80.1 79.3*  PLT 460* 494*    Basic Metabolic Panel:  Lab Results  Component Value Date   NA 133 (L) 07/08/2023   K 3.9 07/08/2023   CO2 24 07/08/2023   GLUCOSE 112 (H) 07/08/2023   BUN 16 07/08/2023   CREATININE 0.92 07/08/2023   CALCIUM  7.8 (L) 07/08/2023   GFRNONAA >60 07/08/2023   GFRAA >  60 06/07/2019   Lipid Panel:  Lab Results  Component Value Date   LDLCALC 45 03/08/2023   HgbA1c:  Lab Results  Component Value Date   HGBA1C 4.9 03/07/2023   Urine Drug Screen: No results found for: LABOPIA, COCAINSCRNUR, LABBENZ, AMPHETMU, THCU, LABBARB  Alcohol  Level No results found for: [facility] INR  Lab Results  Component Value Date   INR 1.8 (H) 07/05/2023   APTT  Lab Results  Component Value Date   APTT 162 (HH) 07/07/2023     Assessment  Wendy Sanchez is a 73 y.o. female admitted for sepsis and encephalopathy with new onset twitching, complicated by adrenal crisis and tachycardia. Her encephalopathy initially progressively worsened, but seems to no longer be worsening as of today (Thursday).  - Exam reveals findings consistent with a severe encephalopathy.   - Imaging: - CT head (6/19): Generalized cerebral atrophy and microvascular disease changes of the supratentorial brain. No acute intracranial  abnormality. - MRI brain w/wo contrast (personally reviewed): Moderate to severely motion limited study without evidence of obvious acute abnormality. No abnormal enhancement is seen. No temporal lobe signal abnormalities.  - Of note, she has an allergy to iodinated contrast (anaphylaxis) - EEGs: - EEG (6/20):  Intermittent slow, generalized. This study is suggestive of mild diffuse encephalopathy. No seizures or epileptiform discharges were seen throughout the recording. - EEG (6/23): Abnormalities seen consist of generalized periodic discharges with triphasic morphology (GPDs) in addition to continuous generalized slowing. Overall, this study is suggestive of moderate diffuse encephalopathy typically related to toxic-metabolic causes like cefepime  toxicity. No seizures or epileptiform discharges were seen throughout the recording. - Labs: - WBC on Wednesday was improved to 8.3  - Recent B12 level was elevated - Recent vitamin A  level was low at 9.4 - Recent zinc  level was low at 29 - AM cortisol on 6/20 was low at 2.5.  - ID is consulting. They doubt a meningitis, but LP cannot be done to rule this out.  - Impression: - Most likely underlying etiologies for her worsened encephalopathy include meningitis, septic encephalopathy and residual cefepime -neurotoxicity. Cefepime  has been stopped. There may be a component of hospital delirium as well.  - Initial spell in the ED was most likely syncope secondary to hypotension - Her diffuse limb jerking with stimulation is now resolved. This may have been secondary to Neurontin  toxicity - Seizure with postictal state is also a possible etiology for her encephalopathy, but this is difficult to differentiate from infection as the etiology for her encephalopathy (aka septic encephalopathy). Her infection could certainly lower her seizure threshold, but seizure is now lower on the DDx.  - Vitamin deficiencies due to her prior gastric bypass could have reduced  her neurological reserve.  - Prior history of giant cell arteritis. No stroke on MRI this admission.  - Other active Problems: COPD, lower urinary tract infection, hypotension, peripheral neuropathy, paroxysmal atrial fibrillation, reactive thrombocytosis, adrenal crisis, chronically bedbound with chronic ankle contractures     Recommendations  - Continue Keppra at 1000 mg BID - Repeat EEG today (ordered) - Currently on meropenem , vancomycin  and acyclovir per ID recommendations. Fluconazole  was also started on Wednesday.  - Unable to obtain LP due to anticoagulation - Vitamin and mineral supplementation per Dietician consultant - Neurology will continue to follow with you  35 minutes spent in the neurological evaluation and management of this critically ill patient   ______________________________________________________________________   Bonney SHARK, Liborio Saccente, MD [facility]

## 2023-07-08 NOTE — Progress Notes (Signed)
 Progress Note   Patient: Wendy Sanchez FMW:[id number] DOB: 1951/01/12 DOA: 07/01/2023     7 DOS: the patient was seen and examined on 07/08/2023   Brief hospital course: From HPI [name] is a 73 y.o. African-American female with medical history significant for asthma, osteoarthritis, giant cell arthritis, combined systolic and diastolic CHF, COPD, DVT, PE and atrial fibrillation on Eliquis , who presented to the emergency room with acute onset of increased weakness.  Patient was hypotensive upon arriving to hospital, she also had a syncope due to hypotension. Patient also has tachycardia, tachypnea and leukocytosis.  She also has severe altered mental status, EEG did not show any evidence of seizure.  MRI of the brain poor quality, but did not show any acute changes.  LP was performed, could not collect any CSF. Her urine culture grow E. coli Serratia and Pseudomonas.  Patient was placed on broad-spectrum antibiotics.     Principal Problem:   Sepsis due to gram-negative UTI [facility]) Active Problems:   Lower urinary tract infection   Syncope   Hypotension   Chronic obstructive pulmonary disease (COPD) (HCC)   Peripheral neuropathy   Paroxysmal atrial fibrillation (HCC)   GERD without esophagitis   Asthma-COPD overlap syndrome (HCC)   Reactive thrombocytosis   Adrenal crisis (HCC)   Weakness   Encephalopathy   Assessment and Plan: Severe sepsis due to gram-negative UTI (HCC) Gram-negative urinary tract infection. Stage III sacral decubitus ulcer - Sepsis manifested by leukocytosis, tachycardia and tachypnea.  Patient also had a severe lactic acidosis of 4.5. Patient received sepsis fluid in the emergency room Sepsis also compounded by decubitus sacral ulcer Wound care on board; we appreciate input Culture results showing Pseudomonas in the wound, urine cultures growing E. coli, Serratia as well as Pseudomonas Patient is seen by infectious disease, currently treated with  meropenem  and vancomycin .   Patient is followed by infectious disease, will continue treatment.   Acute metabolic encephalopathy rule out possible bacterial meningitis or encephalitis Noted to have some tremors involving the right face and legs as well as bilateral upper extremities EEG obtained that did not show any epileptiform waves Patient initiated on Keppra and neurology was consulted LP was attempted by IR today however unsuccessful Currently patient on vancomycin , meropenem  and acyclovir According to ID pharmacist meropenem  will also cover Listeria (no need for ampicillin) MRI of the brain with and without contrast showed a moderate motion artifact, but no apparent acute changes. Patient has been followed by neurology on a daily basis.  Discussed with Dr. Lindzen, this is unlikely due to cefepime  toxicity.  More likely due to metabolic encephalopathy from infection.  However, we will continue cover for meningitis. Neurology has ordered another EEG. So far, patient has not been able to take any p.o., will place a Dobbhoff, start tube feeding.    Syncope likely secondary to hypotension Adrenal crisis. Hypoglycemia secondary to adrenal crisis Continue neurochecks closely Echo showed EF 45 to 50% with grade 1 diastolic dysfunction Patient not fluid overloaded Patient had cortisol level of 2.5, she also had significant hypotension, hypoglycemia.  She has adrenal crisis.  She was chronically taking lower dose of Cortef , started stress dose hydrocortisone  at 100 mg every 8 hours 6/25. Patient blood pressure has been stabilized, discontinue midodrine .   Hypokalemia Hyponatremia. Potassium normalized, discussed with RN, will discontinue IV fluids once tube feeding started.    Chronic obstructive pulmonary disease (COPD) (HCC) ABG did not show any evidence of CO2 retention. Continue her as  needed DuoNebs.   Peripheral neuropathy Continue to hold Neurontin  given encephalopathy   GERD  without esophagitis Continue PPI therapy   Paroxysmal atrial fibrillation (HCC) with RVR. With cardiology, patient still has some tachycardia, started on IV amiodarone , also changed anticoagulation to heparin  drip.  Patient was also given digoxin and beta-blocker.   Class II obesity with BMI 37.78. Complicated patient care as well as prognosis    Pressure ulcer POA  Stage 2 pressure ulcers bilateral ischial tuberosity     Subjective:  Patient opening eyes, but does not follow commands.  No shortness of breath.  Physical Exam: Vitals:   07/08/23 0630 07/08/23 0700 07/08/23 0800 07/08/23 0900  BP: 131/82 (!) 116/105 119/76 134/76  Pulse: (!) 111 (!) 109 (!) 109 (!) 110  Resp: (!) 27 (!) 27 17 19   Temp:      TempSrc:      SpO2: 100% 100% 100% 100%  Weight:      Height:       General exam: Appears calm and comfortable, obese  Respiratory system: Clear to auscultation. Respiratory effort normal. Cardiovascular system: Regular and tachycardic. No JVD, murmurs, rubs, gallops or clicks. No pedal edema. Gastrointestinal system: Abdomen is nondistended, soft and nontender. No organomegaly or masses felt. Normal bowel sounds heard. Central nervous system: Drowsy, opening eyes only.  Does not follow commands.  Extremities: Symmetric 5 x 5 power. Skin: No rashes, lesions or ulcers    Data Reviewed:  Lab results reviewed.  Family Communication: Sister and son updated at the bedside.  Disposition: Status is: Inpatient Remains inpatient appropriate because: Severity of disease, IV treatment.     Time spent: 52 minutes  Author: Murvin Mana, MD 07/08/2023 10:10 AM  For on call review [email or web address].

## 2023-07-08 NOTE — Consult Note (Signed)
 Pharmacy Antibiotic Note  Wendy Sanchez is a 73 y.o. female admitted on 07/01/2023 with meningitis.  Pharmacy has been consulted for vancomycin , ceftriaxone , acyclovir, and ampicillin dosing. Meropenem  replaced ceftriaxone  and ampicillin to provider coverage for pseudomonas in urine cx and sacral wound. Possible underlying etiologies for her worsened encephalopathy include meningitis and cefepime -neurotoxicity.  LP was attempted 6/24 but not able to obtain any CSF fluid.  MRI brain attempted 6/25 but study limited due to motion.    Today, 07/08/2023 Day #7 vancomycin , Day #4 additonal CNS coverage - now meropenem  and acyclovir - on fluconazole  prior to admission - looks to be chronic medication per dispense records Renal: SCr WNL, stable, up to 0.92 today with elevated trough noted last night.  Urine output looks to be down last 24-48h afebrile WBC 8.3 on 6/25 6/19 Ucx (cath): >100k E coli (R amp, cip), S. marcescens (R NTF), PsA (pan susc) 6/19 Bcx NGTD 6/20 sacral wound (swab by ED RN): PsA (pan-susc), C. striatum, B fragilis 6/20 resp panel: Neg 6/24 Bcx: NGTD 6/24 LP and MRI attempted  Continues to be encephalopathic and twitching  Vancomycin  level: Vancomycin  dose adjusted from 1750mg  IV q24h to 1000mg  IV q12h on 6/24 for meningitis Vancomycin  trough at 23:14 (prior to 6th dose of 1gm q12h) = 41 mcg/ml.  No vancomycin  dose given last night Vancomycin  random vancomycin  level 6/26 at 1112 = 37 mcg/mL  Plan: Hold vancomycin  for increased trough last night and level remains elevated today  Not using AUC dosing since treating CNS infection Continue acyclovir 10 mg/kg IV q8H  Continue meropenem  2gm IV q8h (CNS dosing) Will closely monitor for need to adjust above antibiotics if SCr increases further.   Monitor renal function closely Check another random vancomycin  level with morning labs 6/27    Height: 5' 8 (172.7 cm) Weight: 112.7 kg (248 lb 7.3 oz) IBW/kg (Calculated) :  63.9  Temp (24hrs), Avg:98.6 F (37 C), Min:97.7 F (36.5 C), Max:99.7 F (37.6 C)  Recent Labs  Lab 07/01/23 1645 07/01/23 1936 07/04/23 0432 07/05/23 0253 07/05/23 2336 07/05/23 2338 07/06/23 0246 07/06/23 1017 07/07/23 0318 07/07/23 0947 07/07/23 2314 07/08/23 0437  WBC  --    < > 6.8 15.7* 11.2*  --   --  10.7* 8.3  --   --   --   CREATININE  --    < > 0.91 0.88 0.88  --   --  0.83 0.70  --   --  0.92  LATICACIDVEN 1.7  --   --   --   --  1.2 4.5*  --   --  1.2  --   --   VANCOTROUGH  --   --   --   --   --   --   --   --   --   --  41*  --    < > = values in this interval not displayed.    Estimated Creatinine Clearance: 72.8 mL/min (by C-G formula based on SCr of 0.92 mg/dL).    Allergies  Allergen Reactions   Cefepime  Other (See Comments)    Encephalopathy requiring transfer to ICU on 07/05/2023 (not allergy so may use other cephalosporins and beta lactams).     Hydrocodone  Swelling   Hydrocodone -Acetaminophen  Swelling    hands   Iodinated Contrast Media Anaphylaxis   Iodine Anaphylaxis and Swelling    Iv dye  02/03/21-Ispoke to patient and she had IV contrast  X4 last year and has had  no problem. ( Last CT abdomen with contrast was on 01/02/21.-   Nitrofurantoin    Other Other (See Comments)    ALLERGY TO METAL - BLACKENS SKIN AND CAUSES A RASH   Tape Swelling and Other (See Comments)    Skin comes off.  Paper tape is ok tegaderm OK   Shellfish-Derived Products    Wound Dressing Adhesive    Peanut Oil Other (See Comments)    ** Nuts cause runny nose   Shellfish Allergy Nausea And Vomiting and Swelling    Episode of GI infection after eating clam chowder. Still eats shrimp and other seafood   Antibiotics this admit:  Cefepime  6/19>> 6/23 ceftriaxone  6/23 >> 6/24 ampicillin 6/23 >> 6/24 vancomycin  6/20>> fluconazole  (oral candidiasis) 6/20 >> meropenem  6/24 >> acyclovir 6/23 >>  Thank you for allowing pharmacy to be a part of this patient's  care.  Jaqualyn Juday, PharmD, BCPS, BCIDP Work Cell: [phone] 07/08/2023 11:50 AM

## 2023-07-08 NOTE — Progress Notes (Signed)
 Eeg done

## 2023-07-08 NOTE — Progress Notes (Signed)
 PHARMACY - ANTICOAGULATION CONSULT NOTE  Pharmacy Consult for heparin  infusion Indication: h/o DVT & PE  Allergies  Allergen Reactions   Cefepime  Other (See Comments)    Encephalopathy requiring transfer to ICU on 07/05/2023 (not allergy so may use other cephalosporins and beta lactams).     Hydrocodone  Swelling   Hydrocodone -Acetaminophen  Swelling    hands   Iodinated Contrast Media Anaphylaxis   Iodine Anaphylaxis and Swelling    Iv dye  02/03/21-Ispoke to patient and she had IV contrast  X4 last year and has had no problem. ( Last CT abdomen with contrast was on 01/02/21.-   Nitrofurantoin    Other Other (See Comments)    ALLERGY TO METAL - BLACKENS SKIN AND CAUSES A RASH   Tape Swelling and Other (See Comments)    Skin comes off.  Paper tape is ok tegaderm OK   Shellfish-Derived Products    Wound Dressing Adhesive    Peanut Oil Other (See Comments)    ** Nuts cause runny nose   Shellfish Allergy Nausea And Vomiting and Swelling    Episode of GI infection after eating clam chowder. Still eats shrimp and other seafood    Patient Measurements: Height: 5' 8 (172.7 cm) Weight: 112.7 kg (248 lb 7.3 oz) IBW/kg (Calculated) : 63.9 Heparin  Dosing Weight: 91.1 kg   Vital Signs: Temp: 99 F (37.2 C) (06/26 1600) Temp Source: Oral (06/26 1600) BP: 116/92 (06/26 1600) Pulse Rate: 91 (06/26 1600)  Labs: Recent Labs    07/05/23 2336 07/06/23 1017 07/07/23 0318 07/07/23 2314 07/08/23 0437 07/08/23 1022 07/08/23 1802  HGB 8.7* 8.9* 9.3*  --   --   --   --   HCT 26.0* 27.0* 27.6*  --   --   --   --   PLT 492* 460* 494*  --   --   --   --   APTT 49*  --   --  162*  --  91* 96*  LABPROT 21.2*  --   --   --   --   --   --   INR 1.8*  --   --   --   --   --   --   HEPARINUNFRC  --   --   --   --   --  0.29*  --   CREATININE 0.88 0.83 0.70  --  0.92  --   --     Estimated Creatinine Clearance: 72.8 mL/min (by C-G formula based on SCr of 0.92 mg/dL).   Medical  History: Past Medical History:  Diagnosis Date   Allergic rhinitis    Arthritis    Asthma    problems with fumes and aerosols cause asthma   Chest pain 10/19/2016   CHF (congestive heart failure) (HCC)    Complication of anesthesia    awakens during surgery; has occurred with last 3-4 surgeries    COPD (chronic obstructive pulmonary disease) (HCC)    DVT (deep venous thrombosis) (HCC) 07/13/2018   Right leg   Environmental allergies    fumes    Fibromyalgia    GERD (gastroesophageal reflux disease)    Headache    History of bronchitis    Hx of total knee arthroplasty 12/13/2015   Hyperlipidemia    Hypertension    Morbid obesity with BMI of 45.0-49.9, adult (HCC) 07/25/2015   NICM (nonischemic cardiomyopathy) (HCC)    a. ? PVC mediated;  b. 06/2016 Echo: EF 25-30%, mild LVH, mild LAE, mild  AI/MR/TR/PR; c. 08/2016 Cath: nl cors, EF 25%.   Numbness    hands bilat when driving; improves when not preforming task    Osteoarthritis of left knee 08/02/2015   PE (pulmonary thromboembolism) (HCC) 07/13/2018   Pes anserinus bursitis of left knee 05/18/2016   Presence of right artificial knee joint 05/18/2016   PVC (premature ventricular contraction) 06/04/2017   PVC's (premature ventricular contractions)    a. 11/2016 Amio started;  b. 11/29/16 48h Holter: 57574 PVC's (41%); c. 12/2016 24h Holter: 18040 PVC's (15%); c. 02/2017 48h Holter: 37262 PVC's (41%).   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   Temporal arteritis (HCC) 05/01/2020   Tinnitus    comes and goes    Unilateral primary osteoarthritis, right knee 12/13/2015   Vertigo    none for over 2 yrs    Medications:  Scheduled:   vitamin C   500 mg Per Tube BID   calcium  carbonate  1 tablet Per Tube TID WC   Chlorhexidine  Gluconate Cloth  6 each Topical Daily   copper   2 mg Per Tube Daily   feeding  supplement (PROSource TF20)  60 mL Per Tube Daily   free water   30 mL Per Tube Q4H   hydrocortisone  sod succinate (SOLU-CORTEF ) inj  100 mg Intravenous Q8H   levETIRAcetam  1,000 mg Intravenous Q12H   liver oil-zinc  oxide   Topical BID   metoprolol  succinate  12.5 mg Oral Daily   multivitamin with minerals  1 tablet Per Tube BID   nystatin  cream   Topical BID   mouth rinse  15 mL Mouth Rinse 4 times per day   pantoprazole  (PROTONIX ) IV  40 mg Intravenous QHS   QUEtiapine  25 mg Oral QHS   thiamine   100 mg Per Tube Daily   torsemide   10 mg Oral Daily   vancomycin  variable dose per unstable renal function (pharmacist dosing)   Does not apply See admin instructions   zinc  sulfate (50mg  elemental zinc )  220 mg Per Tube Daily   Infusions:   acyclovir Stopped (07/08/23 1420)   amiodarone  30 mg/hr (07/08/23 1600)   feeding supplement (OSMOLITE 1.5 CAL) 50 mL/hr at 07/08/23 1600   heparin  1,000 Units/hr (07/08/23 1600)   lactated ringers  75 mL/hr at 07/08/23 1600   meropenem  (MERREM ) IV Stopped (07/08/23 1434)   promethazine  (PHENERGAN ) injection (IM or IVPB)      Assessment: 73 y.o. African-American female with medical history significant for asthma, osteoarthritis, giant cell arthritis, combined systolic and diastolic CHF, COPD, DVT, PE and atrial fibrillation on Eliquis , who presented to the emergency room with acute onset of increased weakness. Last dose of apixaban  07/05/23 0919   Goal of Therapy:  anti-Xa level 0.3-0.7 units/ml aPTT 66 - 102 seconds Monitor platelets by anticoagulation protocol: Yes  06/25 2314 aPTT 161 (verbal), supratherapeutic 06/26 1802 aPTT 96.    Plan:  aPTT is therapeutic x 2. Will continue heparin  infusion at 1000 units/hr. Recheck aPTT/HL/CBC with AM labs.    Wendy Sanchez, PharmD, BCPS 07/08/2023 7:05 PM

## 2023-07-08 NOTE — Progress Notes (Signed)
 Nutrition Follow-up  DOCUMENTATION CODES:   Morbid obesity  INTERVENTION:   -RD will follow for diet advancement and add supplements as appropriate -Once NGT placement has been verified:   Initiate Osmolite 1.5 @ 20 ml/hr and increase by 10 ml every 8 hours to goal rate of 50 ml/hr.    60 ml Prosource TF20 daily  30 ml free water  flush every 4 hours   Tube feeding regimen provides 1880 kcal (100% of needs), 95 grams of protein, and 914 ml of H2O.  Total free water : 1094 ml daily   -500 mg vitamin C  BID -220 mg zinc  sulfate daily 14 days -MVI with minerals daily -100 mg thiamine  daily x 7 days -Monitor Mg, K, and Phos daily and replete as needed secondary to high refeeding risk -2 mg copper  daily x 30 days  NUTRITION DIAGNOSIS:   Increased nutrient needs related to wound healing as evidenced by estimated needs.  Ongoing  GOAL:   Patient will meet greater than or equal to 90% of their needs  Progressing   MONITOR:   PO intake, Supplement acceptance  REASON FOR ASSESSMENT:   Consult Wound healing  ASSESSMENT:   Pt with medical history significant for asthma, osteoarthritis, giant cell arthritis, combined systolic and diastolic CHF, COPD, DVT, PE and atrial fibrillation on Eliquis , who presented with acute onset of increased weakness.  6/23- s/p EEG- revealed moderate diffuse encephalopathy typically related to toxic-metabolic causes like cefepime  toxicity, rapid response- transferred to ICU 6/24- LP attempted- no return of CSF fluid 6/26- NGT placed  Reviewed I/O's: +3.6 L x 24 hours and +2.1 L since admission  UOP: 750 ml x 24 hours   Per MD, pt is opening eyes, but not following commands. Per neurology notes, neurological exam has worsened.   Pt lying in bed, noted NGT placed. Pt did not interact with this RD. Md confirmed plan to start TF today.   Medications reviewed and include solu-cortef , demadex  and difluxan.   Labs reviewed: Na: 133, CBGS: 87-132  (inpatient orders for glycemic control are none). Copper : 51.    Diet Order:   Diet Order             Diet NPO time specified  Diet effective now                   EDUCATION NEEDS:   Education needs have been addressed  Skin:  Skin Assessment: Skin Integrity Issues: Skin Integrity Issues:: Stage III Stage III: sacrum  Last BM:  07/06/23 (via colostomy)  Height:   Ht Readings from Last 1 Encounters:  07/01/23 5' 8 (1.727 m)    Weight:   Wt Readings from Last 1 Encounters:  07/06/23 112.7 kg    Ideal Body Weight:  63.6 kg  BMI:  Body mass index is 37.78 kg/m.  Estimated Nutritional Needs:   Kcal:  1900-2100  Protein:  95-110 grams  Fluid:  1.9-2.1 L    Margery ORN, RD, LDN, CDCES Registered Dietitian III Certified Diabetes Care and Education Specialist If unable to reach this RD, please use RD Inpatient group chat on secure chat between hours of 8am-4 pm daily

## 2023-07-08 NOTE — Progress Notes (Signed)
 Rounding Note   Patient Name: Wendy Sanchez Date of Encounter: 07/08/2023  [facility] Cardiologist: Evalene Lunger, MD   Subjective Minimally arousable this morning, peers relatively unchanged from yesterday Feeding tube in place Unable to respond to questions Persistent encephalopathy No family at the bedside Telemetry reviewed, no further episodes of SVT, remains on amiodarone  infusion given unable to tolerate pills  Scheduled Meds:  vitamin C   500 mg Per Tube BID   calcium  carbonate  1 tablet Per Tube TID WC   Chlorhexidine  Gluconate Cloth  6 each Topical Daily   copper   2 mg Per Tube Daily   feeding supplement (PROSource TF20)  60 mL Per Tube Daily   free water   30 mL Per Tube Q4H   hydrocortisone  sod succinate (SOLU-CORTEF ) inj  100 mg Intravenous Q8H   levETIRAcetam  1,000 mg Intravenous Q12H   liver oil-zinc  oxide   Topical BID   metoprolol  succinate  12.5 mg Oral Daily   multivitamin with minerals  1 tablet Per Tube BID   nystatin  cream   Topical BID   pantoprazole  (PROTONIX ) IV  40 mg Intravenous QHS   QUEtiapine  25 mg Oral QHS   thiamine   100 mg Per Tube Daily   torsemide   10 mg Oral Daily   vancomycin  variable dose per unstable renal function (pharmacist dosing)   Does not apply See admin instructions   zinc  sulfate (50mg  elemental zinc )  220 mg Per Tube Daily   Continuous Infusions:  acyclovir Stopped (07/08/23 0624)   amiodarone  30 mg/hr (07/08/23 0909)   feeding supplement (OSMOLITE 1.5 CAL)     fluconazole  (DIFLUCAN ) IV Stopped (07/07/23 1344)   heparin  1,000 Units/hr (07/08/23 0904)   lactated ringers  75 mL/hr at 07/08/23 0626   meropenem  (MERREM ) IV 2 g (07/08/23 0629)   promethazine  (PHENERGAN ) injection (IM or IVPB)     PRN Meds: acetaminophen  **OR** acetaminophen , albuterol , digoxin, diphenhydrAMINE , HYDROmorphone  (DILAUDID ) injection, liver oil-zinc  oxide, magnesium  hydroxide, melatonin, metoprolol  tartrate, ondansetron  **OR**  ondansetron  (ZOFRAN ) IV, oxyCODONE -acetaminophen  **AND** oxyCODONE , polyethylene glycol, promethazine  (PHENERGAN ) injection (IM or IVPB)   Vital Signs  Vitals:   07/08/23 0630 07/08/23 0700 07/08/23 0800 07/08/23 0900  BP: 131/82 (!) 116/105 119/76 134/76  Pulse: (!) 111 (!) 109 (!) 109 (!) 110  Resp: (!) 27 (!) 27 17 19   Temp:      TempSrc:      SpO2: 100% 100% 100% 100%  Weight:      Height:        Intake/Output Summary (Last 24 hours) at 07/08/2023 1144 Last data filed at 07/08/2023 0626 Gross per 24 hour  Intake 4328.83 ml  Output 750 ml  Net 3578.83 ml      07/06/2023    5:00 AM 07/01/2023    8:14 PM 03/26/2023    7:20 AM  Last 3 Weights  Weight (lbs) 248 lb 7.3 oz 274 lb 4.8 oz 95 lb 10.9 oz  Weight (kg) 112.7 kg 124.422 kg 43.4 kg      Telemetry Normal sinus rhythm- Personally Reviewed  ECG   - Personally Reviewed  Physical Exam  GEN: No acute distress.  Lethargic, unable to arouse Neck: No JVD Cardiac: RRR, no murmurs, rubs, or gallops.  Respiratory: Coarse breath sounds GI: Soft,  non-distended  MS: Trace lower extremity edema; No deformity.  Unable to purposely move her extremities Neuro: Full exam not performed Psych: Unarousable  Labs High Sensitivity Troponin:   Recent Labs  Lab 07/01/23 1936  TROPONINIHS 30*     Chemistry Recent Labs  Lab 07/02/23 0238 07/03/23 0510 07/05/23 2336 07/06/23 1017 07/07/23 0318 07/08/23 0437  NA 134*   < > 136 138 135 133*  K 3.4*   < > 3.1* 2.8* 3.2* 3.9  CL 104   < > 105 107 106 103  CO2 26   < > 24 25 21* 24  GLUCOSE 88   < > 90 75 70 112*  BUN 13   < > 22 19 16 16   CREATININE 0.66   < > 0.88 0.83 0.70 0.92  CALCIUM  6.9*   < > 7.4* 7.4* 7.6* 7.8*  MG 1.9  --   --   --  1.7 2.0  PROT  --   --  4.3* 4.2*  --   --   ALBUMIN   --   --  <1.5* <1.5* <1.5*  --   AST  --   --  24 23  --   --   ALT  --   --  16 14  --   --   ALKPHOS  --   --  123 122  --   --   BILITOT  --   --  0.3 0.6  --   --   GFRNONAA  >60   < > >60 >60 >60 >60  ANIONGAP 4*   < > 7 6 8 6    < > = values in this interval not displayed.    Lipids No results for input(s): CHOL, TRIG, HDL, LABVLDL, LDLCALC, CHOLHDL in the last 168 hours.  Hematology Recent Labs  Lab 07/05/23 2336 07/06/23 1017 07/07/23 0318  WBC 11.2* 10.7* 8.3  RBC 3.21* 3.37* 3.48*  HGB 8.7* 8.9* 9.3*  HCT 26.0* 27.0* 27.6*  MCV 81.0 80.1 79.3*  MCH 27.1 26.4 26.7  MCHC 33.5 33.0 33.7  RDW 18.4* 18.6* 18.3*  PLT 492* 460* 494*   Thyroid  No results for input(s): TSH, FREET4 in the last 168 hours.  BNPNo results for input(s): BNP, PROBNP in the last 168 hours.  DDimer No results for input(s): DDIMER in the last 168 hours.   Radiology  MR BRAIN W WO CONTRAST Result Date: 07/06/2023 CLINICAL DATA:  Meningitis/CNS infection suspected EXAM: MRI HEAD WITHOUT AND WITH CONTRAST TECHNIQUE: Multiplanar, multiecho pulse sequences of the brain and surrounding structures were obtained without and with intravenous contrast. CONTRAST:  10mL GADAVIST  GADOBUTROL  1 MMOL/ML IV SOLN COMPARISON:  CT head from earlier today. FINDINGS: Moderate to severely motion limited evaluation. Within this limitation: Brain: No obvious acute infarction, hemorrhage, hydrocephalus, extra-axial collection or mass lesion. No pathologic enhancement. Vascular: Major arterial flow voids are maintained at the skull base. Skull and upper cervical spine: Normal marrow signal. Sinuses/Orbits: No obvious acute abnormality. Other: No mastoid effusions. IMPRESSION: Moderate to severely motion limited study without evidence of obvious acute abnormality. Electronically Signed   By: Gilmore GORMAN Molt M.D.   On: 07/06/2023 17:18   DG FL GUIDED LUMBAR PUNCTURE Result Date: 07/06/2023 CLINICAL DATA:  73 year old female who presented to the ED with weakness and concern for possible UTI on 6/19. Patient has gradually become more altered throughout her hospital stay. EEGs do not show any  epileptiform waves, but are consistent with encephalopathy. EXAM: LUMBAR PUNCTURE UNDER FLUOROSCOPY PROCEDURE: An appropriate skin entry site was determined fluoroscopically. Operator donned sterile gloves and mask. Skin site was marked, then prepped with Betadine, draped in usual sterile fashion, and infiltrated locally with 1% lidocaine . A 7 inch  22 gauge spinal needle advanced into the thecal sac at L5-S1 from a left interlaminar approach. Imaging was reassuring of appropriate needle positioning, but there was no CSF return. A second attempt was then made at the level of L2-3 from a left interlaminar approach. A small amount of CSF return was noted in the hub when the patient coughed, but quickly disappeared. The needle was rotated to facilitate fluid return, but unfortunately no CSF fluid was able to be obtained. The needle was then removed. The patient tolerated the procedure well and there were no complications. FLUOROSCOPY: Radiation Exposure Index (as provided by the fluoroscopic device): 39.6 mGy Kerma IMPRESSION: Technically successful lumbar puncture under fluoroscopy. This exam was performed by Sherrilee Bal, PA-C, and was supervised and interpreted by Dr. KANDICE Banner. Electronically Signed   By: Cordella Banner   On: 07/06/2023 13:19    Cardiac Studies   Patient Profile   Ms. 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 difficile, presenting with weakness, UTI, worsening sacral wound, syncope in the ER Several episodes of narrow complex tachycardia lasting less than 1 hour consistent with SVT, previously treated with amiodarone   Assessment & Plan  Paroxysmal SVT Known history of SVT, previously placed on amiodarone  at  hospital discharge March 2025, she stopped this as an outpatient as prescription ran out, was not treated this hospitalization -Multiple episodes of SVT up to 45 minutes to 1 hour on several occasions this admission -Unable to tolerate pills at this time given encephalopathy -July 07, 2023 started with amiodarone  bolus and infusion Appears to be tolerating well, telemetry with no further SVT -Once she passes speech and swallow testing, could change to oral amiodarone  pill   Encephalopathy Starting June 23 in the setting of UTI, sacral decub ulcer, septic picture, urology following EEG pending, MRI brain completed with motion artifact   Sepsis due to UTI, stage III sacral decubitus ulcer Presenting with tachycardia, tachypnea, leukocytosis, lactic acidosis on broad-spectrum antibiotics   History of DVT/PE - High risk for recurrent event, on Eliquis  as outpatient, unable to tolerate pills - On heparin  infusion   For questions or updates, please contact [facility] Please consult [email or web address] for contact info under     Signed, Seraiah Nowack, MD  07/08/2023, 11:44 AM

## 2023-07-08 NOTE — Progress Notes (Signed)
 PHARMACY - ANTICOAGULATION CONSULT NOTE  Pharmacy Consult for heparin  infusion Indication: h/o DVT & PE  Allergies  Allergen Reactions   Cefepime  Other (See Comments)    Encephalopathy requiring transfer to ICU on 07/05/2023 (not allergy so may use other cephalosporins and beta lactams).     Hydrocodone  Swelling   Hydrocodone -Acetaminophen  Swelling    hands   Iodinated Contrast Media Anaphylaxis   Iodine Anaphylaxis and Swelling    Iv dye  02/03/21-Ispoke to patient and she had IV contrast  X4 last year and has had no problem. ( Last CT abdomen with contrast was on 01/02/21.-   Nitrofurantoin    Other Other (See Comments)    ALLERGY TO METAL - BLACKENS SKIN AND CAUSES A RASH   Tape Swelling and Other (See Comments)    Skin comes off.  Paper tape is ok tegaderm OK   Shellfish-Derived Products    Wound Dressing Adhesive    Peanut Oil Other (See Comments)    ** Nuts cause runny nose   Shellfish Allergy Nausea And Vomiting and Swelling    Episode of GI infection after eating clam chowder. Still eats shrimp and other seafood    Patient Measurements: Height: 5' 8 (172.7 cm) Weight: 112.7 kg (248 lb 7.3 oz) IBW/kg (Calculated) : 63.9 Heparin  Dosing Weight: 91.1 kg   Vital Signs: Temp: 99.3 F (37.4 C) (06/26 0400) Temp Source: Axillary (06/26 0400) BP: 131/82 (06/26 0630) Pulse Rate: 111 (06/26 0630)  Labs: Recent Labs    07/05/23 2336 07/06/23 1017 07/07/23 0318 07/07/23 2314 07/08/23 0437  HGB 8.7* 8.9* 9.3*  --   --   HCT 26.0* 27.0* 27.6*  --   --   PLT 492* 460* 494*  --   --   APTT 49*  --   --  162*  --   LABPROT 21.2*  --   --   --   --   INR 1.8*  --   --   --   --   CREATININE 0.88 0.83 0.70  --  0.92    Estimated Creatinine Clearance: 72.8 mL/min (by C-G formula based on SCr of 0.92 mg/dL).   Medical History: Past Medical History:  Diagnosis Date   Allergic rhinitis    Arthritis    Asthma    problems with fumes and aerosols cause asthma   Chest  pain 10/19/2016   CHF (congestive heart failure) (HCC)    Complication of anesthesia    awakens during surgery; has occurred with last 3-4 surgeries    COPD (chronic obstructive pulmonary disease) (HCC)    DVT (deep venous thrombosis) (HCC) 07/13/2018   Right leg   Environmental allergies    fumes    Fibromyalgia    GERD (gastroesophageal reflux disease)    Headache    History of bronchitis    Hx of total knee arthroplasty 12/13/2015   Hyperlipidemia    Hypertension    Morbid obesity with BMI of 45.0-49.9, adult (HCC) 07/25/2015   NICM (nonischemic cardiomyopathy) (HCC)    a. ? PVC mediated;  b. 06/2016 Echo: EF 25-30%, mild LVH, mild LAE, mild AI/MR/TR/PR; c. 08/2016 Cath: nl cors, EF 25%.   Numbness    hands bilat when driving; improves when not preforming task    Osteoarthritis of left knee 08/02/2015   PE (pulmonary thromboembolism) (HCC) 07/13/2018   Pes anserinus bursitis of left knee 05/18/2016   Presence of right artificial knee joint 05/18/2016   PVC (premature ventricular contraction) 06/04/2017  PVC's (premature ventricular contractions)    a. 11/2016 Amio started;  b. 11/29/16 48h Holter: 57574 PVC's (41%); c. 12/2016 24h Holter: 18040 PVC's (15%); c. 02/2017 48h Holter: 37262 PVC's (41%).   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   Temporal arteritis (HCC) 05/01/2020   Tinnitus    comes and goes    Unilateral primary osteoarthritis, right knee 12/13/2015   Vertigo    none for over 2 yrs    Medications:  Scheduled:   Chlorhexidine  Gluconate Cloth  6 each Topical Daily   hydrocortisone  sod succinate (SOLU-CORTEF ) inj  100 mg Intravenous Q8H   lactose free nutrition  237 mL Oral TID WC   levETIRAcetam  1,000 mg Intravenous Q12H   liver oil-zinc  oxide   Topical BID   metoprolol  succinate  12.5 mg Oral Daily   midodrine   5 mg Oral TID AC    nystatin  cream   Topical BID   pantoprazole  (PROTONIX ) IV  40 mg Intravenous QHS   torsemide   10 mg Oral Daily   vancomycin  variable dose per unstable renal function (pharmacist dosing)   Does not apply See admin instructions   Infusions:   acyclovir Stopped (07/08/23 0624)   amiodarone  30 mg/hr (07/08/23 0626)   fluconazole  (DIFLUCAN ) IV Stopped (07/07/23 1344)   heparin  1,000 Units/hr (07/08/23 0626)   lactated ringers  75 mL/hr at 07/08/23 0626   meropenem  (MERREM ) IV 2 g (07/08/23 0629)   promethazine  (PHENERGAN ) injection (IM or IVPB)      Assessment: 73 y.o. African-American female with medical history significant for asthma, osteoarthritis, giant cell arthritis, combined systolic and diastolic CHF, COPD, DVT, PE and atrial fibrillation on Eliquis , who presented to the emergency room with acute onset of increased weakness. Last dose of apixaban  07/05/23 0919   Goal of Therapy:  anti-Xa level 0.3-0.7 units/ml aPTT 66 - 102 seconds Monitor platelets by anticoagulation protocol: Yes  06/25 2314 aPTT 161 (verbal), supratherapeutic   Plan: aPTT therapeutic x 1 (not yet correlating to (anti-Xa level) ---continue heparin  infusion at 1000 units/hr  ---Recheck aPTT level in 8 hours (we will use aPTT to guide therapy until aPTT and anti-Xa level correlate) ---Continue to monitor H&H and platelets  Adriana Bolster, PharmD, BCPS 07/08/2023 7:51 AM

## 2023-07-09 DIAGNOSIS — A419 Sepsis, unspecified organism: Secondary | ICD-10-CM | POA: Diagnosis not present

## 2023-07-09 DIAGNOSIS — G9340 Encephalopathy, unspecified: Secondary | ICD-10-CM | POA: Diagnosis not present

## 2023-07-09 DIAGNOSIS — A4150 Gram-negative sepsis, unspecified: Secondary | ICD-10-CM | POA: Diagnosis not present

## 2023-07-09 DIAGNOSIS — I480 Paroxysmal atrial fibrillation: Secondary | ICD-10-CM | POA: Diagnosis not present

## 2023-07-09 DIAGNOSIS — G039 Meningitis, unspecified: Secondary | ICD-10-CM | POA: Diagnosis not present

## 2023-07-09 DIAGNOSIS — R569 Unspecified convulsions: Secondary | ICD-10-CM | POA: Diagnosis not present

## 2023-07-09 DIAGNOSIS — G9341 Metabolic encephalopathy: Secondary | ICD-10-CM | POA: Diagnosis not present

## 2023-07-09 DIAGNOSIS — N390 Urinary tract infection, site not specified: Secondary | ICD-10-CM | POA: Diagnosis not present

## 2023-07-09 DIAGNOSIS — I471 Supraventricular tachycardia, unspecified: Secondary | ICD-10-CM | POA: Diagnosis not present

## 2023-07-09 LAB — BASIC METABOLIC PANEL WITH GFR
Anion gap: 6 (ref 5–15)
BUN: 20 mg/dL (ref 8–23)
CO2: 25 mmol/L (ref 22–32)
Calcium: 7.7 mg/dL — ABNORMAL LOW (ref 8.9–10.3)
Chloride: 105 mmol/L (ref 98–111)
Creatinine, Ser: 1.05 mg/dL — ABNORMAL HIGH (ref 0.44–1.00)
GFR, Estimated: 56 mL/min — ABNORMAL LOW (ref >60)
Glucose, Bld: 139 mg/dL — ABNORMAL HIGH (ref 70–99)
Potassium: 3.6 mmol/L (ref 3.5–5.1)
Sodium: 136 mmol/L (ref 135–145)

## 2023-07-09 LAB — CBC
HCT: 24.6 % — ABNORMAL LOW (ref 36.0–46.0)
Hemoglobin: 8.4 g/dL — ABNORMAL LOW (ref 12.0–15.0)
MCH: 26.3 pg (ref 26.0–34.0)
MCHC: 34.1 g/dL (ref 30.0–36.0)
MCV: 76.9 fL — ABNORMAL LOW (ref 80.0–100.0)
Platelets: 490 10*3/uL — ABNORMAL HIGH (ref 150–400)
RBC: 3.2 MIL/uL — ABNORMAL LOW (ref 3.87–5.11)
RDW: 17.6 % — ABNORMAL HIGH (ref 11.5–15.5)
WBC: 6.1 10*3/uL (ref 4.0–10.5)
nRBC: 0 % (ref 0.0–0.2)

## 2023-07-09 LAB — VANCOMYCIN, RANDOM: Vancomycin Rm: 29 ug/mL

## 2023-07-09 LAB — GLUCOSE, CAPILLARY
Glucose-Capillary: 125 mg/dL — ABNORMAL HIGH (ref 70–99)
Glucose-Capillary: 156 mg/dL — ABNORMAL HIGH (ref 70–99)
Glucose-Capillary: 145 mg/dL — ABNORMAL HIGH (ref 70–99)
Glucose-Capillary: 126 mg/dL — ABNORMAL HIGH (ref 70–99)
Glucose-Capillary: 135 mg/dL — ABNORMAL HIGH (ref 70–99)

## 2023-07-09 LAB — APTT
aPTT: 102 s — ABNORMAL HIGH (ref 24–36)
aPTT: 84 s — ABNORMAL HIGH (ref 24–36)

## 2023-07-09 LAB — MAGNESIUM: Magnesium: 2 mg/dL (ref 1.7–2.4)

## 2023-07-09 LAB — MRSA NEXT GEN BY PCR, NASAL: MRSA by PCR Next Gen: NOT DETECTED

## 2023-07-09 LAB — HEPARIN LEVEL (UNFRACTIONATED): Heparin Unfractionated: 0.29 [IU]/mL — ABNORMAL LOW (ref 0.30–0.70)

## 2023-07-09 MED ORDER — SODIUM CHLORIDE 0.9 % IV SOLN
2.0000 g | Freq: Two times a day (BID) | INTRAVENOUS | Status: DC
  Administered 2023-07-09 – 2023-07-12 (×6): 2 g via INTRAVENOUS
  Filled 2023-07-09 (×7): qty 40

## 2023-07-09 MED ORDER — DEXTROSE 5 % IV SOLN
10.0000 mg/kg | Freq: Two times a day (BID) | INTRAVENOUS | Status: DC
  Administered 2023-07-09 – 2023-07-12 (×6): 880 mg via INTRAVENOUS
  Filled 2023-07-09 (×7): qty 17.6

## 2023-07-09 NOTE — Progress Notes (Signed)
 PHARMACY - ANTICOAGULATION CONSULT NOTE  Pharmacy Consult for heparin  infusion Indication: h/o DVT & PE  Allergies  Allergen Reactions   Cefepime  Other (See Comments)    Encephalopathy requiring transfer to ICU on 07/05/2023 (not allergy so may use other cephalosporins and beta lactams).     Hydrocodone  Swelling   Hydrocodone -Acetaminophen  Swelling    hands   Iodinated Contrast Media Anaphylaxis   Iodine Anaphylaxis and Swelling    Iv dye  02/03/21-Ispoke to patient and she had IV contrast  X4 last year and has had no problem. ( Last CT abdomen with contrast was on 01/02/21.-   Nitrofurantoin    Other Other (See Comments)    ALLERGY TO METAL - BLACKENS SKIN AND CAUSES A RASH   Tape Swelling and Other (See Comments)    Skin comes off.  Paper tape is ok tegaderm OK   Shellfish-Derived Products    Wound Dressing Adhesive    Peanut Oil Other (See Comments)    ** Nuts cause runny nose   Shellfish Allergy Nausea And Vomiting and Swelling    Episode of GI infection after eating clam chowder. Still eats shrimp and other seafood    Patient Measurements: Height: 5' 8 (172.7 cm) Weight: 117.8 kg (259 lb 11.2 oz) IBW/kg (Calculated) : 63.9 Heparin  Dosing Weight: 91.1 kg   Vital Signs: Temp: 97.9 F (36.6 C) (06/27 1200) BP: 130/88 (06/27 1300) Pulse Rate: 90 (06/27 1300)  Labs: Recent Labs    07/07/23 0318 07/07/23 2314 07/08/23 0437 07/08/23 1022 07/08/23 1802 07/09/23 0530  HGB 9.3*  --   --   --   --  8.4*  HCT 27.6*  --   --   --   --  24.6*  PLT 494*  --   --   --   --  490*  APTT  --    < >  --  91* 96* 102*  HEPARINUNFRC  --   --   --  0.29*  --  0.29*  CREATININE 0.70  --  0.92  --   --  1.05*   < > = values in this interval not displayed.    Estimated Creatinine Clearance: 65.4 mL/min (A) (by C-G formula based on SCr of 1.05 mg/dL (H)).   Medical History: Past Medical History:  Diagnosis Date   Allergic rhinitis    Arthritis    Asthma    problems with  fumes and aerosols cause asthma   Chest pain 10/19/2016   CHF (congestive heart failure) (HCC)    Complication of anesthesia    awakens during surgery; has occurred with last 3-4 surgeries    COPD (chronic obstructive pulmonary disease) (HCC)    DVT (deep venous thrombosis) (HCC) 07/13/2018   Right leg   Environmental allergies    fumes    Fibromyalgia    GERD (gastroesophageal reflux disease)    Headache    History of bronchitis    Hx of total knee arthroplasty 12/13/2015   Hyperlipidemia    Hypertension    Morbid obesity with BMI of 45.0-49.9, adult (HCC) 07/25/2015   NICM (nonischemic cardiomyopathy) (HCC)    a. ? PVC mediated;  b. 06/2016 Echo: EF 25-30%, mild LVH, mild LAE, mild AI/MR/TR/PR; c. 08/2016 Cath: nl cors, EF 25%.   Numbness    hands bilat when driving; improves when not preforming task    Osteoarthritis of left knee 08/02/2015   PE (pulmonary thromboembolism) (HCC) 07/13/2018   Pes anserinus bursitis of left  knee 05/18/2016   Presence of right artificial knee joint 05/18/2016   PVC (premature ventricular contraction) 06/04/2017   PVC's (premature ventricular contractions)    a. 11/2016 Amio started;  b. 11/29/16 48h Holter: 57574 PVC's (41%); c. 12/2016 24h Holter: 18040 PVC's (15%); c. 02/2017 48h Holter: 37262 PVC's (41%).   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   Temporal arteritis (HCC) 05/01/2020   Tinnitus    comes and goes    Unilateral primary osteoarthritis, right knee 12/13/2015   Vertigo    none for over 2 yrs    Medications:  Scheduled:   vitamin C   500 mg Per Tube BID   calcium  carbonate  1 tablet Per Tube TID WC   Chlorhexidine  Gluconate Cloth  6 each Topical Daily   copper   2 mg Per Tube Daily   feeding supplement (PROSource TF20)  60 mL Per Tube Daily   free water   30 mL Per Tube Q4H   hydrocortisone  sod  succinate (SOLU-CORTEF ) inj  100 mg Intravenous Q8H   levETIRAcetam   1,000 mg Intravenous Q12H   liver oil-zinc  oxide   Topical BID   metoprolol  succinate  12.5 mg Oral Daily   multivitamin with minerals  1 tablet Per Tube BID   nystatin  cream   Topical BID   mouth rinse  15 mL Mouth Rinse 4 times per day   pantoprazole  (PROTONIX ) IV  40 mg Intravenous QHS   QUEtiapine   25 mg Oral QHS   thiamine   100 mg Per Tube Daily   torsemide   10 mg Oral Daily   vancomycin  variable dose per unstable renal function (pharmacist dosing)   Does not apply See admin instructions   zinc  sulfate (50mg  elemental zinc )  220 mg Per Tube Daily   Infusions:   acyclovir  Stopped (07/09/23 0650)   amiodarone  30 mg/hr (07/09/23 1354)   feeding supplement (OSMOLITE 1.5 CAL) 40 mL/hr at 07/09/23 1354   heparin  950 Units/hr (07/09/23 1354)   meropenem  (MERREM ) IV 280 mL/hr at 07/09/23 1354   promethazine  (PHENERGAN ) injection (IM or IVPB)      Assessment: 73 y.o. African-American female with medical history significant for asthma, osteoarthritis, giant cell arthritis, combined systolic and diastolic CHF, COPD, DVT, PE and atrial fibrillation on Eliquis , who presented to the emergency room with acute onset of increased weakness. Last dose of apixaban  07/05/23 0919   Goal of Therapy:  anti-Xa level 0.3-0.7 units/ml aPTT 66 - 102 seconds Monitor platelets by anticoagulation protocol: Yes  06/25 2314 aPTT 161 (verbal), supratherapeutic 06/26 1802 aPTT 96 06/27 0530 aPTT 102, borderline supratherapeutic   Plan:  ---aPTT therapeutic  ---continue heparin  infusion at 950 units/hr  ---recheck aPTT level in am given multiple therapeutic levels (we will use aPTT to guide therapy until aPTT and anti-Xa level correlate) ---continue to monitor H&H and platelets.   Adriana Bolster, PharmD, BCPS 07/09/2023 2:00 PM

## 2023-07-09 NOTE — Consult Note (Signed)
 Pharmacy Antibiotic Note  Wendy Sanchez is a 73 y.o. female admitted on 07/01/2023 with meningitis.  Pharmacy has been consulted for vancomycin , ceftriaxone , acyclovir , and ampicillin  dosing. Meropenem  replaced ceftriaxone  and ampicillin  to provider coverage for pseudomonas in urine cx and sacral wound. Possible underlying etiologies for her worsened encephalopathy include meningitis and cefepime -neurotoxicity.  LP was attempted 6/24 but not able to obtain any CSF fluid.  MRI brain attempted 6/25 but study limited due to motion.    Today, 07/09/2023 Day #8 vancomycin , Day #4 additonal CNS coverage - now meropenem  and acyclovir  Renal: SCr WNL, stable, up to 1.05 today with elevated trough noted last night.  Urine output looks to have improved over last 24h but did get dose of her oral torsemide  6/26 afternoon (had not received d/t mental status and inability to take PO) afebrile WBC 6.1 6/19 Ucx (cath): >100k E coli (R amp, cip), S. marcescens (R NTF), PsA (pan susc) 6/19 Bcx NGTD 6/20 sacral wound (swab by ED RN): PsA (pan-susc), C. striatum, B fragilis 6/20 resp panel: Neg 6/24 Bcx: NGTD 6/24 LP and MRI attempted  Continues to be encephalopathic and twitching  Vancomycin  level: Vancomycin  dose adjusted from 1750mg  IV q24h to 1000mg  IV q12h on 6/24 for meningitis Vancomycin  trough at 23:14 (prior to 6th dose of 1gm q12h) = 41 mcg/ml.  No vancomycin  dose given last night Vancomycin  random  level 6/26 at 1112 = 37 mcg/mL Vancomycin  random level 6/27 at 0530 = 29 mcg/mL   Plan: Continue to hold vancomycin  for increased trough last night and level remains elevated today  Not using AUC dosing since treating CNS infection Adjust acyclovir  10 mg/kg IV from q8h to q12H  Adjust meropenem  2gm IV from q8h to q12h (CNS dosing) Based on urine output and vancomycin  clearance likely CrCl truly <53ml/min despite currently calculated at 65 ml/min (discussed with ID) Monitor renal function closely Check  another random vancomycin  level with morning labs 6/30 - no plans for vancomycin  over the weekend    Height: 5' 8 (172.7 cm) Weight: 117.8 kg (259 lb 11.2 oz) IBW/kg (Calculated) : 63.9  Temp (24hrs), Avg:99 F (37.2 C), Min:98.3 F (36.8 C), Max:99.7 F (37.6 C)  Recent Labs  Lab 07/05/23 0253 07/05/23 2336 07/05/23 2338 07/06/23 0246 07/06/23 1017 07/07/23 0318 07/07/23 0947 07/07/23 2314 07/08/23 0437 07/08/23 1112 07/09/23 0530  WBC 15.7* 11.2*  --   --  10.7* 8.3  --   --   --   --  6.1  CREATININE 0.88 0.88  --   --  0.83 0.70  --   --  0.92  --  1.05*  LATICACIDVEN  --   --  1.2 4.5*  --   --  1.2  --   --   --   --   VANCOTROUGH  --   --   --   --   --   --   --  30*  --   --   --   VANCORANDOM  --   --   --   --   --   --   --   --   --  37 29    Estimated Creatinine Clearance: 65.4 mL/min (A) (by C-G formula based on SCr of 1.05 mg/dL (H)).    Allergies  Allergen Reactions   Cefepime  Other (See Comments)    Encephalopathy requiring transfer to ICU on 07/05/2023 (not allergy so may use other cephalosporins and beta lactams).  Hydrocodone  Swelling   Hydrocodone -Acetaminophen  Swelling    hands   Iodinated Contrast Media Anaphylaxis   Iodine Anaphylaxis and Swelling    Iv dye  02/03/21-Ispoke to patient and she had IV contrast  X4 last year and has had no problem. ( Last CT abdomen with contrast was on 01/02/21.-   Nitrofurantoin    Other Other (See Comments)    ALLERGY TO METAL - BLACKENS SKIN AND CAUSES A RASH   Tape Swelling and Other (See Comments)    Skin comes off.  Paper tape is ok tegaderm OK   Shellfish-Derived Products    Wound Dressing Adhesive    Peanut Oil Other (See Comments)    ** Nuts cause runny nose   Shellfish Allergy Nausea And Vomiting and Swelling    Episode of GI infection after eating clam chowder. Still eats shrimp and other seafood   Antibiotics this admit:  Cefepime  6/19>> 6/23 ceftriaxone  6/23 >> 6/24 ampicillin  6/23 >>  6/24 vancomycin  6/20>> fluconazole  (oral candidiasis) 6/20 >> meropenem  6/24 >> acyclovir  6/23 >>  Thank you for allowing pharmacy to be a part of this patient's care.  Contina Strain, PharmD, BCPS, BCIDP Work Cell: [phone] 07/09/2023 8:25 AM

## 2023-07-09 NOTE — Procedures (Signed)
 Patient Name: Wendy Sanchez  MRN: [id number]  Epilepsy Attending: Arlin MALVA Krebs  Referring Physician/Provider: Merrianne Locus, MD  Date: 07/08/2023 Duration: 27.48 mins  Patient history:  73 y.o. female admitted for sepsis and encephalopathy with new onset twitching, complicated by adrenal crisis and tachycardia. Her encephalopathy initially progressively worsened, but seems to no longer be worsening as of today (Thursday). EEG to evaluate for seizure.  Level of alertness: Awake/ lethargic   AEDs during EEG study: LEV  Technical aspects: This EEG study was done with scalp electrodes positioned according to the 10-20 International system of electrode placement. Electrical activity was reviewed with band pass filter of 1-70Hz , sensitivity of 7 uV/mm, display speed of 13mm/sec with a 60Hz  notched filter applied as appropriate. EEG data were recorded continuously and digitally stored.  Video monitoring was available and reviewed as appropriate.  Description: EEG showed continuous generalized 3 to 6 Hz theta-delta slowing, at times with triphasic morphology. Hyperventilation and photic stimulation were not performed.     ABNORMALITY - Continuous slow, generalized  IMPRESSION: This study is suggestive of moderate diffuse encephalopathy. No seizures or epileptiform discharges were seen throughout the recording.  Jarriel Papillion O Jakson Delpilar

## 2023-07-09 NOTE — Progress Notes (Signed)
 PHARMACY CONSULT NOTE - FOLLOW UP  Pharmacy Consult for Electrolyte Monitoring and Replacement   Recent Labs: Potassium (mmol/L)  Date Value  07/09/2023 3.6   Magnesium  (mg/dL)  Date Value  93/72/7974 2.0   Calcium  (mg/dL)  Date Value  93/72/7974 7.7 (L)   Albumin  (g/dL)  Date Value  93/74/7974 <1.5 (L)  02/25/2017 3.7   Phosphorus (mg/dL)  Date Value  93/73/7974 2.8   Sodium (mmol/L)  Date Value  07/09/2023 136  04/11/2020 140    Assessment:  73 y.o. African-American female with medical history significant for asthma, osteoarthritis, giant cell arthritis, combined systolic and diastolic CHF, COPD, DVT, PE and atrial fibrillation on Eliquis , who presented to the emergency room with acute onset of increased weakness. Pharmacy is asked to follow and replace electrolytes while in CCU  Diuretics: torsemide  10 mg po once daily  Goal of Therapy:  Electrolytes WNL  Plan:  ---no electrolyte replacement warranted for today ---recheck electrolytes in am  Adriana JONETTA Bolster ,PharmD Clinical Pharmacist 07/09/2023 8:21 AM

## 2023-07-09 NOTE — Progress Notes (Signed)
 PHARMACY - ANTICOAGULATION CONSULT NOTE  Pharmacy Consult for heparin  infusion Indication: h/o DVT & PE  Allergies  Allergen Reactions   Cefepime  Other (See Comments)    Encephalopathy requiring transfer to ICU on 07/05/2023 (not allergy so may use other cephalosporins and beta lactams).     Hydrocodone  Swelling   Hydrocodone -Acetaminophen  Swelling    hands   Iodinated Contrast Media Anaphylaxis   Iodine Anaphylaxis and Swelling    Iv dye  02/03/21-Ispoke to patient and she had IV contrast  X4 last year and has had no problem. ( Last CT abdomen with contrast was on 01/02/21.-   Nitrofurantoin    Other Other (See Comments)    ALLERGY TO METAL - BLACKENS SKIN AND CAUSES A RASH   Tape Swelling and Other (See Comments)    Skin comes off.  Paper tape is ok tegaderm OK   Shellfish-Derived Products    Wound Dressing Adhesive    Peanut Oil Other (See Comments)    ** Nuts cause runny nose   Shellfish Allergy Nausea And Vomiting and Swelling    Episode of GI infection after eating clam chowder. Still eats shrimp and other seafood    Patient Measurements: Height: 5' 8 (172.7 cm) Weight: 117.8 kg (259 lb 11.2 oz) IBW/kg (Calculated) : 63.9 Heparin  Dosing Weight: 91.1 kg   Vital Signs: Temp: 98.3 F (36.8 C) (06/27 0131) Temp Source: Axillary (06/27 0131) BP: 113/75 (06/27 0600) Pulse Rate: 78 (06/27 0600)  Labs: Recent Labs    07/06/23 1017 07/07/23 0318 07/07/23 2314 07/08/23 0437 07/08/23 1022 07/08/23 1802 07/09/23 0530  HGB 8.9* 9.3*  --   --   --   --  8.4*  HCT 27.0* 27.6*  --   --   --   --  24.6*  PLT 460* 494*  --   --   --   --  490*  APTT  --   --    < >  --  91* 96* 102*  HEPARINUNFRC  --   --   --   --  0.29*  --  0.29*  CREATININE 0.83 0.70  --  0.92  --   --  1.05*   < > = values in this interval not displayed.    Estimated Creatinine Clearance: 65.4 mL/min (A) (by C-G formula based on SCr of 1.05 mg/dL (H)).   Medical History: Past Medical History:   Diagnosis Date   Allergic rhinitis    Arthritis    Asthma    problems with fumes and aerosols cause asthma   Chest pain 10/19/2016   CHF (congestive heart failure) (HCC)    Complication of anesthesia    awakens during surgery; has occurred with last 3-4 surgeries    COPD (chronic obstructive pulmonary disease) (HCC)    DVT (deep venous thrombosis) (HCC) 07/13/2018   Right leg   Environmental allergies    fumes    Fibromyalgia    GERD (gastroesophageal reflux disease)    Headache    History of bronchitis    Hx of total knee arthroplasty 12/13/2015   Hyperlipidemia    Hypertension    Morbid obesity with BMI of 45.0-49.9, adult (HCC) 07/25/2015   NICM (nonischemic cardiomyopathy) (HCC)    a. ? PVC mediated;  b. 06/2016 Echo: EF 25-30%, mild LVH, mild LAE, mild AI/MR/TR/PR; c. 08/2016 Cath: nl cors, EF 25%.   Numbness    hands bilat when driving; improves when not preforming task    Osteoarthritis of  left knee 08/02/2015   PE (pulmonary thromboembolism) (HCC) 07/13/2018   Pes anserinus bursitis of left knee 05/18/2016   Presence of right artificial knee joint 05/18/2016   PVC (premature ventricular contraction) 06/04/2017   PVC's (premature ventricular contractions)    a. 11/2016 Amio started;  b. 11/29/16 48h Holter: 57574 PVC's (41%); c. 12/2016 24h Holter: 18040 PVC's (15%); c. 02/2017 48h Holter: 37262 PVC's (41%).   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   Temporal arteritis (HCC) 05/01/2020   Tinnitus    comes and goes    Unilateral primary osteoarthritis, right knee 12/13/2015   Vertigo    none for over 2 yrs    Medications:  Scheduled:   vitamin C   500 mg Per Tube BID   calcium  carbonate  1 tablet Per Tube TID WC   Chlorhexidine  Gluconate Cloth  6 each Topical Daily   copper   2 mg Per Tube Daily   feeding supplement (PROSource TF20)  60  mL Per Tube Daily   free water   30 mL Per Tube Q4H   hydrocortisone  sod succinate (SOLU-CORTEF ) inj  100 mg Intravenous Q8H   levETIRAcetam   1,000 mg Intravenous Q12H   liver oil-zinc  oxide   Topical BID   metoprolol  succinate  12.5 mg Oral Daily   multivitamin with minerals  1 tablet Per Tube BID   nystatin  cream   Topical BID   mouth rinse  15 mL Mouth Rinse 4 times per day   pantoprazole  (PROTONIX ) IV  40 mg Intravenous QHS   QUEtiapine   25 mg Oral QHS   thiamine   100 mg Per Tube Daily   torsemide   10 mg Oral Daily   vancomycin  variable dose per unstable renal function (pharmacist dosing)   Does not apply See admin instructions   zinc  sulfate (50mg  elemental zinc )  220 mg Per Tube Daily   Infusions:   acyclovir  268 mL/hr at 07/09/23 0602   amiodarone  30 mg/hr (07/09/23 0602)   feeding supplement (OSMOLITE 1.5 CAL) 50 mL/hr at 07/09/23 0602   heparin  1,000 Units/hr (07/09/23 0602)   lactated ringers  75 mL/hr at 07/09/23 0602   meropenem  (MERREM ) IV 280 mL/hr at 07/09/23 0602   promethazine  (PHENERGAN ) injection (IM or IVPB)      Assessment: 73 y.o. African-American female with medical history significant for asthma, osteoarthritis, giant cell arthritis, combined systolic and diastolic CHF, COPD, DVT, PE and atrial fibrillation on Eliquis , who presented to the emergency room with acute onset of increased weakness. Last dose of apixaban  07/05/23 0919   Goal of Therapy:  anti-Xa level 0.3-0.7 units/ml aPTT 66 - 102 seconds Monitor platelets by anticoagulation protocol: Yes  06/25 2314 aPTT 161 (verbal), supratherapeutic 06/26 1802 aPTT 96 06/27 0530 aPTT 102, borderline supratherapeutic   Plan:  ---aPTT therapeutic x 3, borderline supratherapeutic (not yet correlating to (anti-Xa level) ---decrease heparin  infusion slightly to 950 units/hr  ---recheck aPTT level at 1400 to reconfirm, the daily (we will use aPTT to guide therapy until aPTT and anti-Xa level  correlate) ---continue to monitor H&H and platelets.   Rankin CANDIE Dills, PharmD, [facility] 07/09/2023 6:44 AM

## 2023-07-09 NOTE — Progress Notes (Addendum)
 Nutrition Follow-up  DOCUMENTATION CODES:   Morbid obesity  INTERVENTION:   -RD will follow for diet advancement and add supplements as appropriate -Continue TF via NGT:    Osmolite 1.5 @ 30 ml/hr and increase by 10 ml every 8 hours to goal rate of 50 ml/hr.    60 ml Prosource TF20 daily   30 ml free water  flush every 4 hours   Tube feeding regimen provides 1880 kcal (100% of needs), 95 grams of protein, and 914 ml of H2O.  Total free water : 1094 ml daily   -Continue 500 mg vitamin C  BID -Continue 220 mg zinc  sulfate daily 14 days -Continue MVI with minerals BID -Continue 500 mg calcium  carbonate TID -Continue 100 mg thiamine  daily x 7 days -Monitor Mg, K, and Phos daily and replete as needed secondary to high refeeding risk -Continue 2 mg copper  daily x 30 days  NUTRITION DIAGNOSIS:   Increased nutrient needs related to wound healing as evidenced by estimated needs.  Ongoing  GOAL:   Patient will meet greater than or equal to 90% of their needs  Progressing   MONITOR:   PO intake, Supplement acceptance  REASON FOR ASSESSMENT:   Consult Wound healing  ASSESSMENT:   Pt with medical history significant for asthma, osteoarthritis, giant cell arthritis, combined systolic and diastolic CHF, COPD, DVT, PE and atrial fibrillation on Eliquis , who presented with acute onset of increased weakness.  6/23- s/p EEG- revealed moderate diffuse encephalopathy typically related to toxic-metabolic causes like cefepime  toxicity, rapid response- transferred to ICU 6/24- LP attempted- no return of CSF fluid 6/26- NGT placed (KUB verified placement in stomach)  Reviewed I/O's: +3.2 L x 24 hours and +5.3 L since admission  UOP: 1.2 L x 24 hours  Per MD, no significant changes in mental status.   Pt lying in bed at time of visit. She did not arouse to voice or touch. No family present.   Osmolite 1.5 infusing via NGT at 309 ml/hr. Pt tolerating well.   Medications reviewed  and include vitamin C , calcium  carbonate, copper , solu-cortef , protonix , thiamine , demadex , and zinc  sulfate.   Labs reviewed: K, Mg, and Phos WDL. CBGS: 125-156 (inpatient orders for glycemic control are ).    Diet Order:   Diet Order     None       EDUCATION NEEDS:   Education needs have been addressed  Skin:  Skin Assessment: Skin Integrity Issues: Skin Integrity Issues:: Stage III Stage III: sacrum  Last BM:  07/06/23 (via colostomy)  Height:   Ht Readings from Last 1 Encounters:  07/01/23 5' 8 (1.727 m)    Weight:   Wt Readings from Last 1 Encounters:  07/09/23 117.8 kg    Ideal Body Weight:  63.6 kg  BMI:  Body mass index is 39.49 kg/m.  Estimated Nutritional Needs:   Kcal:  1900-2100  Protein:  95-110 grams  Fluid:  1.9-2.1 L    Margery ORN, RD, LDN, CDCES Registered Dietitian III Certified Diabetes Care and Education Specialist If unable to reach this RD, please use RD Inpatient group chat on secure chat between hours of 8am-4 pm daily

## 2023-07-09 NOTE — Plan of Care (Signed)
  Problem: Respiratory: Goal: Ability to maintain adequate ventilation will improve Outcome: Progressing   Problem: Clinical Measurements: Goal: Will remain free from infection Outcome: Progressing Goal: Cardiovascular complication will be avoided Outcome: Progressing   Problem: Nutrition: Goal: Adequate nutrition will be maintained Outcome: Progressing   Problem: Education: Goal: Knowledge of General Education information will improve Description: Including pain rating scale, medication(s)/side effects and non-pharmacologic comfort measures Outcome: Not Progressing   Problem: Activity: Goal: Risk for activity intolerance will decrease Outcome: Not Progressing   Problem: Coping: Goal: Level of anxiety will decrease Outcome: Not Progressing

## 2023-07-09 NOTE — Progress Notes (Signed)
 Progress Note   Patient: Wendy Sanchez FMW:[id number] DOB: 1950/12/11 DOA: 07/01/2023     8 DOS: the patient was seen and examined on 07/09/2023   Brief hospital course: From HPI Wendy Sanchez is a 73 y.o. African-American female with medical history significant for asthma, osteoarthritis, giant cell arthritis, combined systolic and diastolic CHF, COPD, DVT, PE and atrial fibrillation on Eliquis , who presented to the emergency room with acute onset of increased weakness.  Patient was hypotensive upon arriving to hospital, she also had a syncope due to hypotension. Patient also has tachycardia, tachypnea and leukocytosis.  She also has severe altered mental status, EEG did not show any evidence of seizure.  MRI of the brain poor quality, but did not show any acute changes.  LP was performed, could not collect any CSF. Her urine culture grow E. coli Serratia and Pseudomonas.  Patient was placed on broad-spectrum antibiotics.     Principal Problem:   Sepsis due to gram-negative UTI [facility] Active Problems:   Lower urinary tract infection   Syncope   Hypotension   Chronic obstructive pulmonary disease (COPD) (HCC)   Peripheral neuropathy   Paroxysmal atrial fibrillation (HCC)   GERD without esophagitis   Asthma-COPD overlap syndrome (HCC)   Reactive thrombocytosis   Adrenal crisis (HCC)   Weakness   Encephalopathy   Acute metabolic encephalopathy   Assessment and Plan: Severe sepsis due to gram-negative UTI (HCC) Gram-negative urinary tract infection. Stage III sacral decubitus ulcer - Sepsis manifested by leukocytosis, tachycardia and tachypnea.  Patient also had a severe lactic acidosis of 4.5. Patient received sepsis fluid in the emergency room Sepsis also compounded by decubitus sacral ulcer Wound care on board; we appreciate input Culture results showing Pseudomonas in the wound, urine cultures growing E. coli, Serratia as well as Pseudomonas Patient is seen by infectious  disease, currently treated with meropenem  and vancomycin .   Discussed with infectious disease, there is no change in antibiotic treatment.   Acute metabolic encephalopathy rule out possible bacterial meningitis or encephalitis Noted to have some tremors involving the right face and legs as well as bilateral upper extremities EEG obtained that did not show any epileptiform waves Patient initiated on Keppra  and neurology was consulted LP was attempted by IR today however unsuccessful Currently patient on vancomycin , meropenem  and acyclovir  According to ID pharmacist meropenem  will also cover Listeria (no need for ampicillin ) MRI of the brain with and without contrast showed a moderate motion artifact, but no apparent acute changes. Patient has been followed by neurology on a daily basis.  Discussed with Dr. Lindzen, this is unlikely due to cefepime  toxicity.  More likely due to metabolic encephalopathy from infection.  However, we will continue cover for meningitis. Repeated EEG 6/26 still show significant encephalopathy, no seizure activity. Dobbhoff was placed 6/26, tube feeding started. Patient so far has not made significant progress in mental status.  Continue to monitor closely.  However, condition is stable enough for transfer to progressive unit.     Syncope likely secondary to hypotension Adrenal crisis. Hypoglycemia secondary to adrenal crisis Continue neurochecks closely Echo showed EF 45 to 50% with grade 1 diastolic dysfunction Patient not fluid overloaded Patient had cortisol level of 2.5, she also had significant hypotension, hypoglycemia.  She has adrenal crisis.  She was chronically taking lower dose of Cortef , started stress dose hydrocortisone  at 100 mg every 8 hours 6/25. I have discontinued midodrine , blood pressure has been since stable.   Hypokalemia Hyponatremia. Sodium and potassium  level normalized.  Slightly worsened renal function, continue to follow.     Chronic obstructive pulmonary disease (COPD) (HCC) ABG did not show any evidence of CO2 retention. Continue her as needed DuoNebs.   Peripheral neuropathy Continue to hold Neurontin  given encephalopathy   GERD without esophagitis Continue PPI therapy   Paroxysmal atrial fibrillation (HCC) with RVR. Patient is followed by cardiology, heart rate more stable.  Still on heparin  drip, amiodarone .  Beta-blocker.  Heart rate better controlled.   Class II obesity with BMI 37.78. Complicated patient care as well as prognosis    Pressure ulcer POA  Stage 2 pressure ulcers bilateral ischial tuberosity        Subjective:  Patient opening eyes only, not following commands.  Does not seem to have shortness of breath. She tolerated tube feeding.  Physical Exam: Vitals:   07/09/23 0600 07/09/23 0700 07/09/23 0800 07/09/23 0900  BP: 113/75 101/77 115/74 130/88  Pulse: 78 77 87 86  Resp: 12 11 20  (!) 25  Temp:   97.9 F (36.6 C)   TempSrc:      SpO2: 100% 100% 100% 100%  Weight:      Height:       General exam: Appears calm and comfortable, morbidly obese. Respiratory system: Clear to auscultation. Respiratory effort normal. Cardiovascular system: Irregular. No JVD, murmurs, rubs, gallops or clicks. No pedal edema. Gastrointestinal system: Abdomen is nondistended, soft and nontender. No organomegaly or masses felt. Normal bowel sounds heard. Central nervous system: Drowsy and confused. No focal neurological deficits. Extremities: Symmetric 5 x 5 power. Skin: No rashes, lesions or ulcers    Data Reviewed:  Lab results reviewed.  Family Communication: Sister updated on the phone.  Disposition: Status is: Inpatient Remains inpatient appropriate because: Severity of disease, IV treatment.  Altered mental status.     Time spent: 60 minutes  Author: Murvin Mana, MD 07/09/2023 10:19 AM  For on call review [email or web address].

## 2023-07-09 NOTE — Progress Notes (Signed)
 NEUROLOGY CONSULT FOLLOW UP NOTE   Date of service: July 09, 2023 Patient Name: NINOSKA GOSWICK MRN:  [id number] DOB:  72/29/1952  Interval Hx/subjective  She continues to be severely encephalopathic.   Vitals   Vitals:   07/09/23 1400 07/09/23 1500 07/09/23 1550 07/09/23 1946  BP: 116/76 111/82 (!) 118/23 107/75  Pulse: 90 92 97 93  Resp: (!) 22 (!) 25 18 18   Temp:   98 F (36.7 C) (!) 97.5 F (36.4 C)  TempSrc:   Oral   SpO2: 100% 100% 100% 100%  Weight:      Height:         Body mass index is 39.49 kg/m.  Physical Exam   Constitutional: Ill-appearing, morbidly obese elderly female  Eyes: No scleral injection.  HENT: No OP obstruction.  Head: Normocephalic. No nuchal rigidity. Neck stiffness seen yesterday is now resolved.  Respiratory: Effort normal, non-labored breathing.  Ext: Pitting edema to feet bilaterally > pretibially   Neurological Examination Mental Status: Severely encephalopathic. Does not answer any questions or follow any commands. Does not respond to her name. Can be aroused to a brief, eyes-open state with sternal rub. Does not reliably blink to threat when eyelids are passively opened and does not make eye contact. Does not make any attempts to communicate. .  Cranial Nerves: II: No reliable blink to threat. PERRL 5 mm >> 3 mm. III,IV, VI: Eyes are conjugate and at the midline. No nystagmus. Does not fixate or track. Some spontaneous conjugate eye movements are noted without gaze preference.  V: Reacts to eyelid stimulation bilaterally VII: Grimaces symmetrically VIII: Does not respond to voice IX,X: Gag reflex deferred XI: Unable to assess XII: Unable to assess Motor/Sensory: BUE: Weak withdrawal to pinch equally bilaterally BLE: Weak withdrawal to pinch equally bilaterally Chronic flexion contractures to ankles bilaterally.  No myoclonus or tremor noted.  Deep Tendon Reflexes: 1+ and symmetric bilateral brachioradialis. Unable to elicit  patellar reflexes due to prior knee replacements Cerebellar: Unable to assess Gait: Unable to assess    Medications  Current Facility-Administered Medications:    acetaminophen  (TYLENOL ) tablet 650 mg, 650 mg, Oral, Q6H PRN, 650 mg at 07/05/23 0136 **OR** acetaminophen  (TYLENOL ) suppository 650 mg, 650 mg, Rectal, Q6H PRN, Mansy, Jan A, MD   acyclovir  (ZOVIRAX ) 880 mg in dextrose  5 % 250 mL IVPB, 10 mg/kg (Adjusted), Intravenous, Q12H, Zeigler, Dustin G, RPH   albuterol  (PROVENTIL ) (2.5 MG/3ML) 0.083% nebulizer solution 2.5 mg, 2.5 mg, Nebulization, Q6H PRN, Mansy, Madison LABOR, MD   [COMPLETED] amiodarone  (NEXTERONE ) 1.8 mg/mL load via infusion 150 mg, 150 mg, Intravenous, Once, 150 mg at 07/07/23 0956 **FOLLOWED BY** [EXPIRED] amiodarone  (NEXTERONE  PREMIX) 360-4.14 MG/200ML-% (1.8 mg/mL) IV infusion, 60 mg/hr, Intravenous, Continuous, Stopped at 07/07/23 1630 **FOLLOWED BY** amiodarone  (NEXTERONE  PREMIX) 360-4.14 MG/200ML-% (1.8 mg/mL) IV infusion, 30 mg/hr, Intravenous, Continuous, Gollan, Timothy J, MD, Last Rate: 16.67 mL/hr at 07/09/23 1509, 30 mg/hr at 07/09/23 1509   ascorbic acid  (VITAMIN C ) tablet 500 mg, 500 mg, Per Tube, BID, Zhang, Dekui, MD, 500 mg at 07/09/23 0920   calcium  carbonate (TUMS - dosed in mg elemental calcium ) chewable tablet 200 mg of elemental calcium , 1 tablet, Per Tube, TID WC, Zhang, Dekui, MD, 200 mg of elemental calcium  at 07/09/23 1703   Chlorhexidine  Gluconate Cloth 2 % PADS 6 each, 6 each, Topical, Daily, Djan, Prince T, MD, 6 each at 07/08/23 1338   copper  tablet 2 mg, 2 mg, Per Tube, Daily, Zhang, Dekui, MD, 2  mg at 07/09/23 9078   digoxin  (LANOXIN ) 0.25 MG/ML injection 0.25 mg, 0.25 mg, Intravenous, Q2H PRN, Cleatus Delayne GAILS, MD, 0.25 mg at 07/07/23 9352   diphenhydrAMINE  (BENADRYL ) capsule 25 mg, 25 mg, Oral, Q6H PRN, Mansy, Jan A, MD   feeding supplement (OSMOLITE 1.5 CAL) liquid 1,000 mL, 1,000 mL, Per Tube, Continuous, Laurita Pillion, MD, Last Rate: 40 mL/hr at  07/09/23 1509, Infusion Verify at 07/09/23 1509   feeding supplement (PROSource TF20) liquid 60 mL, 60 mL, Per Tube, Daily, Laurita Pillion, MD, 60 mL at 07/09/23 0924   free water  30 mL, 30 mL, Per Tube, Q4H, Zhang, Dekui, MD, 30 mL at 07/09/23 1959   heparin  ADULT infusion 100 units/mL (25000 units/250mL), 950 Units/hr, Intravenous, Continuous, Dail Rankin RAMAN, RPH, Last Rate: 9.5 mL/hr at 07/09/23 1509, 950 Units/hr at 07/09/23 1509   hydrocortisone  sodium succinate  (SOLU-CORTEF ) 100 MG injection 100 mg, 100 mg, Intravenous, Q8H, Laurita Pillion, MD, 100 mg at 07/09/23 1512   HYDROmorphone  (DILAUDID ) injection 0.5 mg, 0.5 mg, Intravenous, Q4H PRN, Zhang, Dekui, MD, 0.5 mg at 07/07/23 1056   levETIRAcetam  (KEPPRA ) undiluted injection 1,000 mg, 1,000 mg, Intravenous, Q12H, Djan, Prince T, MD, 1,000 mg at 07/09/23 0830   liver oil-zinc  oxide (DESITIN) 40 % ointment, , Topical, BID, Mansy, Jan A, MD, Given at 07/09/23 [phone]   liver oil-zinc  oxide (DESITIN) 40 % ointment, , Topical, PRN, Mansy, Jan A, MD   magnesium  hydroxide (MILK OF MAGNESIA) suspension 30 mL, 30 mL, Oral, Daily PRN, Mansy, Jan A, MD   melatonin tablet 5 mg, 5 mg, Oral, QHS PRN, Mansy, Jan A, MD   meropenem  (MERREM ) 2 g in sodium chloride  0.9 % 100 mL IVPB, 2 g, Intravenous, Q12H, Zeigler, Dustin G, RPH   metoprolol  succinate (TOPROL -XL) 24 hr tablet 12.5 mg, 12.5 mg, Oral, Daily, Djan, Prince T, MD, 12.5 mg at 07/09/23 9078   metoprolol  tartrate (LOPRESSOR ) injection 5 mg, 5 mg, Intravenous, Q6H PRN, Dorinda Drue DASEN, MD, 5 mg at 07/07/23 9367   multivitamin with minerals tablet 1 tablet, 1 tablet, Per Tube, BID, Laurita Pillion, MD, 1 tablet at 07/09/23 9180   nystatin  cream (MYCOSTATIN ), , Topical, BID, Djan, Prince T, MD, 1 Application at 07/09/23 9075   ondansetron  (ZOFRAN ) tablet 4 mg, 4 mg, Oral, Q6H PRN **OR** ondansetron  (ZOFRAN ) injection 4 mg, 4 mg, Intravenous, Q6H PRN, Mansy, Jan A, MD, 4 mg at 07/04/23 1223   Oral care mouth  rinse, 15 mL, Mouth Rinse, 4 times per day, Laurita Pillion, MD, 15 mL at 07/09/23 1703   Oral care mouth rinse, 15 mL, Mouth Rinse, PRN, Laurita Pillion, MD   oxyCODONE -acetaminophen  (PERCOCET/ROXICET) 5-325 MG per tablet 1 tablet, 1 tablet, Oral, Q6H PRN, 1 tablet at 07/05/23 1123 **AND** oxyCODONE  (Oxy IR/ROXICODONE ) immediate release tablet 5 mg, 5 mg, Oral, Q6H PRN, Mansy, Jan A, MD, 5 mg at 07/03/23 2158   pantoprazole  (PROTONIX ) injection 40 mg, 40 mg, Intravenous, QHS, Nada Adriana BIRCH, RPH, 40 mg at 07/08/23 2206   polyethylene glycol (MIRALAX  / GLYCOLAX ) packet 17 g, 17 g, Oral, Daily PRN, Mansy, Jan A, MD   promethazine  (PHENERGAN ) 12.5 mg in sodium chloride  0.9 % 50 mL IVPB, 12.5 mg, Intravenous, Q6H PRN, Dorinda, Drue DASEN, MD   QUEtiapine  (SEROQUEL ) tablet 25 mg, 25 mg, Oral, QHS, Zhang, Dekui, MD, 25 mg at 07/08/23 2206   thiamine  (VITAMIN B1) tablet 100 mg, 100 mg, Per Tube, Daily, Zhang, Dekui, MD, 100 mg at 07/09/23 0920   torsemide  (  DEMADEX ) tablet 10 mg, 10 mg, Oral, Daily, Mansy, Jan A, MD, 10 mg at 07/09/23 9078   vancomycin  variable dose per unstable renal function (pharmacist dosing), , Does not apply, See admin instructions, Dail Rankin RAMAN, RPH   zinc  sulfate (50mg  elemental zinc ) capsule 220 mg, 220 mg, Per Tube, Daily, Laurita Pillion, MD, 220 mg at 07/09/23 0920  Labs and Diagnostic Imaging   CBC:  Recent Labs  Lab 07/06/23 1017 07/07/23 0318 07/09/23 0530  WBC 10.7* 8.3 6.1  NEUTROABS 8.8* 6.7  --   HGB 8.9* 9.3* 8.4*  HCT 27.0* 27.6* 24.6*  MCV 80.1 79.3* 76.9*  PLT 460* 494* 490*    Basic Metabolic Panel:  Lab Results  Component Value Date   NA 136 07/09/2023   K 3.6 07/09/2023   CO2 25 07/09/2023   GLUCOSE 139 (H) 07/09/2023   BUN 20 07/09/2023   CREATININE 1.05 (H) 07/09/2023   CALCIUM  7.7 (L) 07/09/2023   GFRNONAA 56 (L) 07/09/2023   GFRAA >60 06/07/2019   Lipid Panel:  Lab Results  Component Value Date   LDLCALC 45 03/08/2023   HgbA1c:  Lab Results   Component Value Date   HGBA1C 4.9 03/07/2023   Urine Drug Screen: No results found for: LABOPIA, COCAINSCRNUR, LABBENZ, AMPHETMU, THCU, LABBARB  Alcohol  Level No results found for: [facility] INR  Lab Results  Component Value Date   INR 1.8 (H) 07/05/2023   APTT  Lab Results  Component Value Date   APTT 84 (H) 07/09/2023     Assessment  KALEIYAH POLSKY is a 73 y.o. female admitted for sepsis and encephalopathy with new onset twitching, complicated by adrenal crisis and tachycardia. Her encephalopathy initially progressively worsened, but seems to no longer be worsening as of today (Thursday).  - Exam with findings that are consistent with severe diffuse encephalopathy that are unchanged since yesterday. - Imaging: - CT head (6/19): Generalized cerebral atrophy and microvascular disease changes of the supratentorial brain. No acute intracranial abnormality. - MRI brain w/wo contrast (personally reviewed): Moderate to severely motion limited study without evidence of obvious acute abnormality. No abnormal enhancement is seen. No temporal lobe signal abnormalities.  - Of note, she has an allergy to iodinated contrast (anaphylaxis) - EEGs: - EEG (6/20):  Intermittent slow, generalized. This study is suggestive of mild diffuse encephalopathy. No seizures or epileptiform discharges were seen throughout the recording. - EEG (6/23): Abnormalities seen consist of generalized periodic discharges with triphasic morphology (GPDs) in addition to continuous generalized slowing. Overall, this study is suggestive of moderate diffuse encephalopathy typically related to toxic-metabolic causes like cefepime  toxicity. No seizures or epileptiform discharges were seen throughout the recording. - EEG (6/27):  Continuous slow, generalized. This study is suggestive of moderate diffuse encephalopathy. No seizures or epileptiform discharges were seen throughout the recording. - Labs: - WBC today continues  to improve. Now at 6.1 - Recent B12 level was elevated - Recent vitamin A  level was low at 9.4 - Recent zinc  level was low at 29 - AM cortisol on 6/20 was low at 2.5.  - ID is consulting. They doubt a meningitis, but LP cannot be done to rule this out.  - Impression: - Most likely underlying etiologies for her worsened encephalopathy include meningitis, septic encephalopathy and residual cefepime -neurotoxicity. Cefepime  has been stopped. There may be a component of hospital delirium as well.  - Initial spell in the ED was most likely syncope secondary to hypotension - Her diffuse limb jerking with stimulation is  now resolved. This may have been secondary to Neurontin  toxicity - Seizure with postictal state is also a possible etiology for her encephalopathy, but this is difficult to differentiate from infection as the etiology for her encephalopathy (aka septic encephalopathy). Her infection could certainly lower her seizure threshold, but seizure is now lower on the DDx.  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  - Prior history of giant cell arteritis. No stroke on MRI this admission.  - Other active Problems: COPD, lower urinary tract infection, hypotension, peripheral neuropathy, paroxysmal atrial fibrillation, reactive thrombocytosis, adrenal crisis, chronically bedbound with chronic ankle contractures   Recommendations  - Continue Keppra  at 1000 mg BID - Currently on meropenem , vancomycin  and acyclovir  per ID recommendations.  - Unable to obtain LP due to anticoagulation - Vitamin and mineral supplementation per Dietician consultant - Neurology will continue to follow with you ______________________________________________________________________   Bonney SHARK, Nechama Escutia, MD [facility]

## 2023-07-09 NOTE — Progress Notes (Signed)
 INFECTIOUS DISEASE PROGRESS NOTE Date of Admission:  07/01/2023     ID: Wendy Sanchez is a 73 y.o. female with sepsis, ams Principal Problem:   Sepsis due to gram-negative UTI (HCC) Active Problems:   Chronic obstructive pulmonary disease (COPD) (HCC)   Lower urinary tract infection   Hypotension   Peripheral neuropathy   Syncope   Paroxysmal atrial fibrillation (HCC)   GERD without esophagitis   Asthma-COPD overlap syndrome (HCC)   Reactive thrombocytosis   Adrenal crisis (HCC)   Weakness   Encephalopathy   Acute metabolic encephalopathy   Subjective: No fevers, wbc down. Episodes of tachycarida. MRI poor study but nothing acute. Remains min responsive  ROS  Unable to obtain  Medications:  Antibiotics Given (last 72 hours)     Date/Time Action Medication Dose Rate   07/06/23 0959 New Bag/Given   ampicillin  (OMNIPEN) 2 g in sodium chloride  0.9 % 100 mL IVPB 2 g 300 mL/hr   07/06/23 1053 New Bag/Given   cefTRIAXone  (ROCEPHIN ) 2 g in sodium chloride  0.9 % 100 mL IVPB 2 g 200 mL/hr   07/06/23 1252 New Bag/Given   vancomycin  (VANCOCIN ) IVPB 1000 mg/200 mL premix 1,000 mg 200 mL/hr   07/06/23 1417 New Bag/Given   meropenem  (MERREM ) 2 g in sodium chloride  0.9 % 100 mL IVPB 2 g 280 mL/hr   07/06/23 1527 New Bag/Given   acyclovir  (ZOVIRAX ) 880 mg in dextrose  5 % 250 mL IVPB 880 mg 267.6 mL/hr   07/06/23 2149 New Bag/Given   acyclovir  (ZOVIRAX ) 880 mg in dextrose  5 % 250 mL IVPB 880 mg 267.6 mL/hr   07/06/23 2256 New Bag/Given   meropenem  (MERREM ) 2 g in sodium chloride  0.9 % 100 mL IVPB 2 g 280 mL/hr   07/06/23 2345 New Bag/Given   vancomycin  (VANCOCIN ) IVPB 1000 mg/200 mL premix 1,000 mg 200 mL/hr   07/07/23 0526 New Bag/Given   acyclovir  (ZOVIRAX ) 880 mg in dextrose  5 % 250 mL IVPB 880 mg 267.6 mL/hr   07/07/23 9357 New Bag/Given   meropenem  (MERREM ) 2 g in sodium chloride  0.9 % 100 mL IVPB 2 g 280 mL/hr   07/07/23 1225 New Bag/Given   vancomycin  (VANCOCIN ) IVPB 1000  mg/200 mL premix 1,000 mg 200 mL/hr   07/07/23 1337 New Bag/Given   meropenem  (MERREM ) 2 g in sodium chloride  0.9 % 100 mL IVPB 2 g 280 mL/hr   07/07/23 1423 New Bag/Given   acyclovir  (ZOVIRAX ) 880 mg in dextrose  5 % 250 mL IVPB 880 mg 267.6 mL/hr   07/07/23 2218 New Bag/Given   meropenem  (MERREM ) 2 g in sodium chloride  0.9 % 100 mL IVPB 2 g 280 mL/hr   07/07/23 2321 New Bag/Given   acyclovir  (ZOVIRAX ) 880 mg in dextrose  5 % 250 mL IVPB 880 mg 267.6 mL/hr   07/08/23 0524 New Bag/Given   acyclovir  (ZOVIRAX ) 880 mg in dextrose  5 % 250 mL IVPB 880 mg 267.6 mL/hr   07/08/23 0629 New Bag/Given   meropenem  (MERREM ) 2 g in sodium chloride  0.9 % 100 mL IVPB 2 g 280 mL/hr   07/08/23 1320 New Bag/Given   acyclovir  (ZOVIRAX ) 880 mg in dextrose  5 % 250 mL IVPB 880 mg 267.6 mL/hr   07/08/23 1404 New Bag/Given   meropenem  (MERREM ) 2 g in sodium chloride  0.9 % 100 mL IVPB 2 g 280 mL/hr   07/08/23 2201 New Bag/Given   acyclovir  (ZOVIRAX ) 880 mg in dextrose  5 % 250 mL IVPB 880 mg 267.6 mL/hr  07/08/23 2220 New Bag/Given   meropenem  (MERREM ) 2 g in sodium chloride  0.9 % 100 mL IVPB 2 g 280 mL/hr   07/09/23 0547 New Bag/Given   acyclovir  (ZOVIRAX ) 880 mg in dextrose  5 % 250 mL IVPB 880 mg 267.6 mL/hr   07/09/23 0547 New Bag/Given   meropenem  (MERREM ) 2 g in sodium chloride  0.9 % 100 mL IVPB 2 g 280 mL/hr       vitamin C   500 mg Per Tube BID   calcium  carbonate  1 tablet Per Tube TID WC   Chlorhexidine  Gluconate Cloth  6 each Topical Daily   copper   2 mg Per Tube Daily   feeding supplement (PROSource TF20)  60 mL Per Tube Daily   free water   30 mL Per Tube Q4H   hydrocortisone  sod succinate (SOLU-CORTEF ) inj  100 mg Intravenous Q8H   levETIRAcetam   1,000 mg Intravenous Q12H   liver oil-zinc  oxide   Topical BID   metoprolol  succinate  12.5 mg Oral Daily   multivitamin with minerals  1 tablet Per Tube BID   nystatin  cream   Topical BID   mouth rinse  15 mL Mouth Rinse 4 times per day    pantoprazole  (PROTONIX ) IV  40 mg Intravenous QHS   QUEtiapine   25 mg Oral QHS   thiamine   100 mg Per Tube Daily   torsemide   10 mg Oral Daily   vancomycin  variable dose per unstable renal function (pharmacist dosing)   Does not apply See admin instructions   zinc  sulfate (50mg  elemental zinc )  220 mg Per Tube Daily    Objective: Vital signs in last 24 hours: Temp:  [97.9 F (36.6 C)-99.7 F (37.6 C)] 97.9 F (36.6 C) (06/27 0800) Pulse Rate:  [73-103] 86 (06/27 0900) Resp:  [11-26] 25 (06/27 0900) BP: (94-130)/(63-94) 130/88 (06/27 0900) SpO2:  [96 %-100 %] 100 % (06/27 0900) Weight:  [117.8 kg] 117.8 kg (06/27 0340)   Constitutional:  chronically ill appearing, min responsive but opens eyes Cardiovascular: Normal rate, regular rhythm and normal heart sounds.  Pulmonary/Chest: Effort normal and breath sounds normal. No respiratory distress.  has no wheezes.  Neck = supple, no nuchal rigidity Abdominal: Soft. Bowel sounds are normal.  exhibits no distension. There is no tenderness.  Lymphadenopathy: no cervical adenopathy. No axillary adenopathy Neurological: confused, + myoclonus Skin:    Lab Results Recent Labs    07/07/23 0318 07/08/23 0437 07/09/23 0530  WBC 8.3  --  6.1  HGB 9.3*  --  8.4*  HCT 27.6*  --  24.6*  NA 135 133* 136  K 3.2* 3.9 3.6  CL 106 103 105  CO2 21* 24 25  BUN 16 16 20   CREATININE 0.70 0.92 1.05*    Microbiology: Results for orders placed or performed during the hospital encounter of 07/01/23  Blood culture (routine x 2)     Status: None   Collection Time: 07/01/23  4:45 PM   Specimen: BLOOD  Result Value Ref Range Status   Specimen Description BLOOD BLOOD RIGHT HAND  Final   Special Requests   Final    BOTTLES DRAWN AEROBIC AND ANAEROBIC Blood Culture results may not be optimal due to an inadequate volume of blood received in culture bottles   Culture   Final    NO GROWTH 5 DAYS Performed at [facility], [address] [place], [place] [address]    Report Status 07/06/2023 FINAL  Final  Blood culture (routine x 2)  Status: None   Collection Time: 07/01/23  4:45 PM   Specimen: BLOOD  Result Value Ref Range Status   Specimen Description BLOOD BLOOD LEFT ARM  Final   Special Requests   Final    BOTTLES DRAWN AEROBIC ONLY Blood Culture adequate volume   Culture   Final    NO GROWTH 5 DAYS Performed at [facility], [address], [place], [place] [address]    Report Status 07/06/2023 FINAL  Final  Culture, Urine (Do not remove urinary catheter, catheter placed by urology or difficult to place)     Status: Abnormal   Collection Time: 07/01/23  4:45 PM   Specimen: Urine, Catheterized  Result Value Ref Range Status   Specimen Description   Final    URINE, CATHETERIZED Performed at [facility], [address], [place], [place] [address]    Special Requests   Final    NONE Performed at [facility], [address] [place], [place] [address]    Culture (A)  Final    >=100,000 COLONIES/mL ESCHERICHIA COLI >=100,000 COLONIES/mL SERRATIA MARCESCENS >=100,000 COLONIES/mL PSEUDOMONAS AERUGINOSA    Report Status 07/05/2023 FINAL  Final   Organism ID, Bacteria ESCHERICHIA COLI (A)  Final   Organism ID, Bacteria SERRATIA MARCESCENS (A)  Final   Organism ID, Bacteria PSEUDOMONAS AERUGINOSA (A)  Final      Susceptibility   Escherichia coli - MIC*    AMPICILLIN  16 INTERMEDIATE Intermediate     CEFAZOLIN  <=4 SENSITIVE Sensitive     CEFEPIME  <=0.12 SENSITIVE Sensitive     CEFTRIAXONE  <=0.25 SENSITIVE Sensitive     CIPROFLOXACIN >=4 RESISTANT Resistant     GENTAMICIN <=1 SENSITIVE Sensitive     IMIPENEM <=0.25 SENSITIVE Sensitive     NITROFURANTOIN <=16 SENSITIVE Sensitive     TRIMETH/SULFA <=20 SENSITIVE Sensitive     AMPICILLIN /SULBACTAM 4 SENSITIVE Sensitive     PIP/TAZO <=4 SENSITIVE Sensitive ug/mL    * >=100,000 COLONIES/mL ESCHERICHIA COLI   Pseudomonas aeruginosa  - MIC*    CEFTAZIDIME 2 SENSITIVE Sensitive     CIPROFLOXACIN <=0.25 SENSITIVE Sensitive     GENTAMICIN <=1 SENSITIVE Sensitive     IMIPENEM <=0.25 SENSITIVE Sensitive     PIP/TAZO <=4 SENSITIVE Sensitive ug/mL    CEFEPIME  2 SENSITIVE Sensitive     * >=100,000 COLONIES/mL PSEUDOMONAS AERUGINOSA   Serratia marcescens - MIC*    CEFEPIME  <=0.12 SENSITIVE Sensitive     CEFTRIAXONE  <=0.25 SENSITIVE Sensitive     CIPROFLOXACIN <=0.25 SENSITIVE Sensitive     GENTAMICIN <=1 SENSITIVE Sensitive     NITROFURANTOIN 128 RESISTANT Resistant     TRIMETH/SULFA <=20 SENSITIVE Sensitive     * >=100,000 COLONIES/mL SERRATIA MARCESCENS  Aerobic/Anaerobic Culture w Gram Stain (surgical/deep wound)     Status: None   Collection Time: 07/02/23 12:38 AM   Specimen: Wound  Result Value Ref Range Status   Specimen Description   Final    WOUND Performed at [facility], [address], [place], [place] [address]    Special Requests   Final    NONE Performed at [facility], [address] [place], [place] [address]    Gram Stain   Final    FEW WBC PRESENT, PREDOMINANTLY PMN NO ORGANISMS SEEN    Culture   Final    RARE PSEUDOMONAS AERUGINOSA MODERATE CORYNEBACTERIUM STRIATUM Standardized susceptibility testing for this organism is not available. RARE BACTEROIDES FRAGILIS BETA LACTAMASE POSITIVE Performed at [facility], [address] [place], [place] [address]    Report Status 07/05/2023 FINAL  Final   Organism ID, Bacteria PSEUDOMONAS AERUGINOSA  Final      Susceptibility   Pseudomonas aeruginosa - MIC*    CEFTAZIDIME 2 SENSITIVE Sensitive     CIPROFLOXACIN <=0.25 SENSITIVE Sensitive     GENTAMICIN <=1 SENSITIVE Sensitive     IMIPENEM <=0.25 SENSITIVE Sensitive     PIP/TAZO <=4 SENSITIVE Sensitive ug/mL    CEFEPIME  2 SENSITIVE Sensitive     * RARE PSEUDOMONAS AERUGINOSA  Respiratory (~20 pathogens) panel by PCR     Status: None   Collection Time: 07/02/23  2:47  PM   Specimen: Nasopharyngeal Swab; Respiratory  Result Value Ref Range Status   Adenovirus NOT DETECTED NOT DETECTED Final   Coronavirus 229E NOT DETECTED NOT DETECTED Final    Comment: (NOTE) The Coronavirus on the Respiratory Panel, DOES NOT test for the novel  Coronavirus (2019 nCoV)    Coronavirus HKU1 NOT DETECTED NOT DETECTED Final   Coronavirus NL63 NOT DETECTED NOT DETECTED Final   Coronavirus OC43 NOT DETECTED NOT DETECTED Final   Metapneumovirus NOT DETECTED NOT DETECTED Final   Rhinovirus / Enterovirus NOT DETECTED NOT DETECTED Final   Influenza A NOT DETECTED NOT DETECTED Final   Influenza B NOT DETECTED NOT DETECTED Final   Parainfluenza Virus 1 NOT DETECTED NOT DETECTED Final   Parainfluenza Virus 2 NOT DETECTED NOT DETECTED Final   Parainfluenza Virus 3 NOT DETECTED NOT DETECTED Final   Parainfluenza Virus 4 NOT DETECTED NOT DETECTED Final   Respiratory Syncytial Virus NOT DETECTED NOT DETECTED Final   Bordetella pertussis NOT DETECTED NOT DETECTED Final   Bordetella Parapertussis NOT DETECTED NOT DETECTED Final   Chlamydophila pneumoniae NOT DETECTED NOT DETECTED Final   Mycoplasma pneumoniae NOT DETECTED NOT DETECTED Final    Comment: Performed at [facility], [address], [place], [place] [address]  Culture, blood (x 2)     Status: None (Preliminary result)   Collection Time: 07/06/23  2:46 AM   Specimen: BLOOD  Result Value Ref Range Status   Specimen Description BLOOD BLOOD LEFT ARM  Final   Special Requests   Final    BOTTLES DRAWN AEROBIC AND ANAEROBIC Blood Culture adequate volume   Culture   Final    NO GROWTH 3 DAYS Performed at [facility], [address], [place], [place] [address]    Report Status PENDING  Incomplete  Culture, blood (x 2)     Status: None (Preliminary result)   Collection Time: 07/06/23  2:46 AM   Specimen: BLOOD  Result Value Ref Range Status   Specimen Description BLOOD BLOOD LEFT HAND  Final   Special  Requests   Final    BOTTLES DRAWN AEROBIC AND ANAEROBIC Blood Culture adequate volume   Culture   Final    NO GROWTH 3 DAYS Performed at [facility], [address], [place], [place] [address]    Report Status PENDING  Incomplete  MRSA Next Gen by PCR, Nasal     Status: None   Collection Time: 07/08/23 11:52 PM   Specimen: Nasal Mucosa; Nasal Swab  Result Value Ref Range Status   MRSA by PCR Next Gen NOT DETECTED NOT DETECTED Final    Comment: (NOTE) The GeneXpert MRSA Assay (FDA approved for NASAL specimens only), is one component of a comprehensive MRSA colonization surveillance program. It is not intended to diagnose MRSA infection nor to guide or monitor  treatment for MRSA infections. Test performance is not FDA approved in patients less than 63 years old. Performed at [facility], [address] [place], [place] [address]     Studies/Results: EEG adult Result Date: 07/09/2023 Shelton Arlin KIDD, MD     07/09/2023  6:50 AM Patient Name: ZARETH RIPPETOE MRN: [id number] Epilepsy Attending: Arlin KIDD Shelton Referring Physician/Provider: Merrianne Locus, MD Date: 07/08/2023 Duration: 27.48 mins Patient history:  73 y.o. female admitted for sepsis and encephalopathy with new onset twitching, complicated by adrenal crisis and tachycardia. Her encephalopathy initially progressively worsened, but seems to no longer be worsening as of today (Thursday). EEG to evaluate for seizure. Level of alertness: Awake/ lethargic AEDs during EEG study: LEV Technical aspects: This EEG study was done with scalp electrodes positioned according to the 10-20 International system of electrode placement. Electrical activity was reviewed with band pass filter of 1-70Hz , sensitivity of 7 uV/mm, display speed of 84mm/sec with a 60Hz  notched filter applied as appropriate. EEG data were recorded continuously and digitally stored.  Video monitoring was available and reviewed as appropriate. Description:  EEG showed continuous generalized 3 to 6 Hz theta-delta slowing, at times with triphasic morphology. Hyperventilation and photic stimulation were not performed.   ABNORMALITY - Continuous slow, generalized IMPRESSION: This study is suggestive of moderate diffuse encephalopathy. No seizures or epileptiform discharges were seen throughout the recording. Arlin KIDD Shelton   DG Abd Portable 1V Result Date: 07/08/2023 CLINICAL DATA:  NG tube placement. EXAM: PORTABLE ABDOMEN - 1 VIEW COMPARISON:  None Available. FINDINGS: Tip of the weighted enteric tube just to the right of midline in the region of the distal stomach or proximal duodenum. Multiple surgical clips in the upper abdomen. Mild gaseous distention of bowel loops in the central abdomen. IMPRESSION: Tip of the weighted enteric tube just to the right of midline in the region of the distal stomach or proximal duodenum. Electronically Signed   By: Andrea Gasman M.D.   On: 07/08/2023 12:23    Assessment/Plan: Wendy Sanchez is a 73 y.o. female chronically quite ill woman who is bedbound and has contractures and a colostomy as well as a Foley admitted June 19 with acute onset of weakness.  She has a history of asthma osteoarthritis giant cell arteritis CHF COPD DVT PE A-fib on Eliquis .  She has a chronic Foley and it was noted to have sediment in it.  She also has a sacral wound that was tunneling deeper.  In the ED she was hypotensive but alert.  She was afebrile.  She had had a Peltz count of 12 normal renal function lactate of 1.7 anemia with hemoglobin of 9.3 elevated platelets.  Viral respiratory panel was negative.  Urinalysis showed 21-50 Kernes cells.  Imaging initially showed CT of the head generalized cerebral atrophy but negative otherwise.  She was started on cefepime  and vancomycin  June 19.  She then had what appeared to be either a syncopal episode or seizure in the ED. She has been seen by neurology and is undergoing a lumbar puncture.  She  had carotid Dopplers done At baseline she is bedbound has chronic ankle contractures plantarflexion and has a colostomy as well as a Foley catheter.  She was at [employer] before coming home where her sister cares for her   Microdata reviewed and urine culture from her Foley catheter grew E. coli, Serratia and Pseudomonas.  These were all sensitive to cefepime  which she is continued on. I suspect the neuro  changes are cefepime  toxicity. Doubt meningitis but LP cannot be done.    6/26- no fever, wbc down. Still encephalopathic and with some clonus  Recommendations For now continue meropenem , vanco (dosing on levels)  and acyclovir . LP cannot be done so at this point cannot ro meningitis although I think not likely  HX C diff at last admission no diarrhea.  Thank you very much for the consult. Will follow with you.  Alm SHAUNNA Needle   07/09/2023, 9:54 AM

## 2023-07-09 NOTE — Progress Notes (Addendum)
 Rounding Note   Patient Name: Wendy Sanchez Date of Encounter: 07/09/2023  [facility] Cardiologist: Evalene Lunger, MD   Subjective No acute overnight cardiac events. EEG with moderate diffuse encephalopathy. No purposeful movements.   Scheduled Meds:  vitamin C   500 mg Per Tube BID   calcium  carbonate  1 tablet Per Tube TID WC   Chlorhexidine  Gluconate Cloth  6 each Topical Daily   copper   2 mg Per Tube Daily   feeding supplement (PROSource TF20)  60 mL Per Tube Daily   free water   30 mL Per Tube Q4H   hydrocortisone  sod succinate (SOLU-CORTEF ) inj  100 mg Intravenous Q8H   levETIRAcetam   1,000 mg Intravenous Q12H   liver oil-zinc  oxide   Topical BID   metoprolol  succinate  12.5 mg Oral Daily   multivitamin with minerals  1 tablet Per Tube BID   nystatin  cream   Topical BID   mouth rinse  15 mL Mouth Rinse 4 times per day   pantoprazole  (PROTONIX ) IV  40 mg Intravenous QHS   QUEtiapine   25 mg Oral QHS   thiamine   100 mg Per Tube Daily   torsemide   10 mg Oral Daily   vancomycin  variable dose per unstable renal function (pharmacist dosing)   Does not apply See admin instructions   zinc  sulfate (50mg  elemental zinc )  220 mg Per Tube Daily   Continuous Infusions:  acyclovir  Stopped (07/09/23 0650)   amiodarone  30 mg/hr (07/09/23 0725)   feeding supplement (OSMOLITE 1.5 CAL) 50 mL/hr at 07/09/23 0725   heparin  950 Units/hr (07/09/23 0725)   lactated ringers  75 mL/hr at 07/09/23 0725   meropenem  (MERREM ) IV Stopped (07/09/23 0618)   promethazine  (PHENERGAN ) injection (IM or IVPB)     PRN Meds: acetaminophen  **OR** acetaminophen , albuterol , digoxin , diphenhydrAMINE , HYDROmorphone  (DILAUDID ) injection, liver oil-zinc  oxide, magnesium  hydroxide, melatonin, metoprolol  tartrate, ondansetron  **OR** ondansetron  (ZOFRAN ) IV, mouth rinse, oxyCODONE -acetaminophen  **AND** oxyCODONE , polyethylene glycol, promethazine  (PHENERGAN ) injection (IM or IVPB)   Vital Signs  Vitals:    07/09/23 0400 07/09/23 0500 07/09/23 0600 07/09/23 0700  BP: 97/63 95/63 113/75 101/77  Pulse: 81 73 78 77  Resp: 12 13 12 11   Temp:      TempSrc:      SpO2: 100% 100% 100% 100%  Weight:      Height:        Intake/Output Summary (Last 24 hours) at 07/09/2023 0820 Last data filed at 07/09/2023 0725 Gross per 24 hour  Intake 4820.22 ml  Output 1175 ml  Net 3645.22 ml      07/09/2023    3:40 AM 07/06/2023    5:00 AM 07/01/2023    8:14 PM  Last 3 Weights  Weight (lbs) 259 lb 11.2 oz 248 lb 7.3 oz 274 lb 4.8 oz  Weight (kg) 117.8 kg 112.7 kg 124.422 kg      Telemetry Normal sinus rhythm with PACs and PVCs and rare ventricular bigeminy - Personally Reviewed  ECG  No new tracings - Personally Reviewed  Physical Exam  GEN: No acute distress.  Lethargic, unable to arouse Neck: No JVD Cardiac: RRR, no murmurs, rubs, or gallops.  Respiratory: Diminished and coarse breath sounds bilaterally GI: Soft, non-distended  MS: Mild bilateral lower extremity edema; No deformity. Unable to purposely move her extremities Neuro: Full exam not performed Psych: Unarousable  Labs High Sensitivity Troponin:   Recent Labs  Lab 07/01/23 1936  TROPONINIHS 30*     Chemistry Recent Labs  Lab 07/05/23  2336 07/06/23 1017 07/07/23 0318 07/08/23 0437 07/09/23 0530  NA 136 138 135 133* 136  K 3.1* 2.8* 3.2* 3.9 3.6  CL 105 107 106 103 105  CO2 24 25 21* 24 25  GLUCOSE 90 75 70 112* 139*  BUN 22 19 16 16 20   CREATININE 0.88 0.83 0.70 0.92 1.05*  CALCIUM  7.4* 7.4* 7.6* 7.8* 7.7*  MG  --   --  1.7 2.0 2.0  PROT 4.3* 4.2*  --   --   --   ALBUMIN  <1.5* <1.5* <1.5*  --   --   AST 24 23  --   --   --   ALT 16 14  --   --   --   ALKPHOS 123 122  --   --   --   BILITOT 0.3 0.6  --   --   --   GFRNONAA >60 >60 >60 >60 56*  ANIONGAP 7 6 8 6 6     Lipids No results for input(s): CHOL, TRIG, HDL, LABVLDL, LDLCALC, CHOLHDL in the last 168 hours.  Hematology Recent Labs  Lab  07/06/23 1017 07/07/23 0318 07/09/23 0530  WBC 10.7* 8.3 6.1  RBC 3.37* 3.48* 3.20*  HGB 8.9* 9.3* 8.4*  HCT 27.0* 27.6* 24.6*  MCV 80.1 79.3* 76.9*  MCH 26.4 26.7 26.3  MCHC 33.0 33.7 34.1  RDW 18.6* 18.3* 17.6*  PLT 460* 494* 490*   Thyroid  No results for input(s): TSH, FREET4 in the last 168 hours.  BNPNo results for input(s): BNP, PROBNP in the last 168 hours.  DDimer No results for input(s): DDIMER in the last 168 hours.   Radiology  EEG adult Result Date: 07/09/2023 IMPRESSION: This study is suggestive of moderate diffuse encephalopathy. No seizures or epileptiform discharges were seen throughout the recording. Arlin MALVA Krebs   DG Abd Portable 1V Result Date: 07/08/2023 IMPRESSION: Tip of the weighted enteric tube just to the right of midline in the region of the distal stomach or proximal duodenum. Electronically Signed   By: Andrea Gasman M.D.   On: 07/08/2023 12:23    Cardiac Studies  2D echo 07/02/2023: 1. Left ventricular ejection fraction, by estimation, is 45 to 50%. The  left ventricle has mildly decreased function. The left ventricle  demonstrates global hypokinesis. The left ventricular internal cavity size  was mildly dilated. There is mild  concentric left ventricular hypertrophy. Left ventricular diastolic  parameters are consistent with Grade I diastolic dysfunction (impaired  relaxation).   2. Right ventricular systolic function is normal. The right ventricular  size is normal.   3. A small pericardial effusion is present. The pericardial effusion is  posterior to the left ventricle.   4. The mitral valve is grossly normal. Trivial mitral valve  regurgitation.   5. The aortic valve is grossly normal. Aortic valve regurgitation is not  visualized.   Patient Profile   Ms. 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 difficile, presenting with weakness, UTI, worsening sacral wound, syncope in the ER Several episodes of narrow complex tachycardia lasting less than 1 hour consistent with SVT, previously treated with amiodarone   Assessment & Plan  Paroxysmal SVT -Known history of SVT, previously placed on amiodarone  at hospital discharge March 2025. She stopped this as an outpatient as prescription ran out -Multiple  episodes of SVT up to 45 minutes to 1 hour on several occasions this admission -Unable to tolerate pills at this time given encephalopathy -July 07, 2023 started with amiodarone  bolus and infusion, continue amiodarone  gtt at this time -Appears to be tolerating well, telemetry with no further SVT -Once she passes speech and swallow testing, could change to oral amiodarone  pill  HFmrEF: Receiving metoprolol  and torsemide   Escalate as able   Encephalopathy Starting June 23 in the setting of UTI, sacral decub ulcer, septic picture, urology following EEG with moderate diffuse encephalopathy, MRI brain completed with motion artifact   Sepsis due to UTI, stage III sacral decubitus ulcer Presenting with tachycardia, tachypnea, leukocytosis, lactic acidosis on broad-spectrum antibiotics   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

## 2023-07-10 DIAGNOSIS — I471 Supraventricular tachycardia, unspecified: Secondary | ICD-10-CM

## 2023-07-10 DIAGNOSIS — I5043 Acute on chronic combined systolic (congestive) and diastolic (congestive) heart failure: Secondary | ICD-10-CM

## 2023-07-10 DIAGNOSIS — G039 Meningitis, unspecified: Secondary | ICD-10-CM | POA: Diagnosis not present

## 2023-07-10 DIAGNOSIS — R931 Abnormal findings on diagnostic imaging of heart and coronary circulation: Secondary | ICD-10-CM

## 2023-07-10 DIAGNOSIS — N390 Urinary tract infection, site not specified: Secondary | ICD-10-CM | POA: Diagnosis not present

## 2023-07-10 DIAGNOSIS — G9341 Metabolic encephalopathy: Secondary | ICD-10-CM | POA: Diagnosis not present

## 2023-07-10 DIAGNOSIS — A4150 Gram-negative sepsis, unspecified: Secondary | ICD-10-CM | POA: Diagnosis not present

## 2023-07-10 DIAGNOSIS — A419 Sepsis, unspecified organism: Secondary | ICD-10-CM | POA: Diagnosis not present

## 2023-07-10 DIAGNOSIS — G9340 Encephalopathy, unspecified: Secondary | ICD-10-CM | POA: Diagnosis not present

## 2023-07-10 LAB — HEPARIN LEVEL (UNFRACTIONATED)
Heparin Unfractionated: 0.18 [IU]/mL — ABNORMAL LOW (ref 0.30–0.70)
Heparin Unfractionated: 0.27 [IU]/mL — ABNORMAL LOW (ref 0.30–0.70)
Heparin Unfractionated: 0.33 [IU]/mL (ref 0.30–0.70)

## 2023-07-10 LAB — CBC
HCT: 24.9 % — ABNORMAL LOW (ref 36.0–46.0)
Hemoglobin: 8.8 g/dL — ABNORMAL LOW (ref 12.0–15.0)
MCH: 27.3 pg (ref 26.0–34.0)
MCHC: 35.3 g/dL (ref 30.0–36.0)
MCV: 77.3 fL — ABNORMAL LOW (ref 80.0–100.0)
Platelets: 485 10*3/uL — ABNORMAL HIGH (ref 150–400)
RBC: 3.22 MIL/uL — ABNORMAL LOW (ref 3.87–5.11)
RDW: 17.9 % — ABNORMAL HIGH (ref 11.5–15.5)
WBC: 7.1 10*3/uL (ref 4.0–10.5)
nRBC: 0 % (ref 0.0–0.2)

## 2023-07-10 LAB — BASIC METABOLIC PANEL WITH GFR
Anion gap: 9 (ref 5–15)
BUN: 23 mg/dL (ref 8–23)
CO2: 22 mmol/L (ref 22–32)
Calcium: 7.8 mg/dL — ABNORMAL LOW (ref 8.9–10.3)
Chloride: 105 mmol/L (ref 98–111)
Creatinine, Ser: 1.13 mg/dL — ABNORMAL HIGH (ref 0.44–1.00)
GFR, Estimated: 52 mL/min — ABNORMAL LOW (ref >60)
Glucose, Bld: 126 mg/dL — ABNORMAL HIGH (ref 70–99)
Potassium: 3.4 mmol/L — ABNORMAL LOW (ref 3.5–5.1)
Sodium: 136 mmol/L (ref 135–145)

## 2023-07-10 LAB — GLUCOSE, CAPILLARY
Glucose-Capillary: 126 mg/dL — ABNORMAL HIGH (ref 70–99)
Glucose-Capillary: 129 mg/dL — ABNORMAL HIGH (ref 70–99)
Glucose-Capillary: 130 mg/dL — ABNORMAL HIGH (ref 70–99)
Glucose-Capillary: 134 mg/dL — ABNORMAL HIGH (ref 70–99)
Glucose-Capillary: 134 mg/dL — ABNORMAL HIGH (ref 70–99)
Glucose-Capillary: 146 mg/dL — ABNORMAL HIGH (ref 70–99)

## 2023-07-10 LAB — APTT
aPTT: 110 s — ABNORMAL HIGH (ref 24–36)
aPTT: 75 s — ABNORMAL HIGH (ref 24–36)
aPTT: 93 s — ABNORMAL HIGH (ref 24–36)

## 2023-07-10 LAB — MAGNESIUM: Magnesium: 2.2 mg/dL (ref 1.7–2.4)

## 2023-07-10 MED ORDER — POTASSIUM CHLORIDE 20 MEQ PO PACK
40.0000 meq | PACK | Freq: Once | ORAL | Status: AC
  Administered 2023-07-10: 40 meq
  Filled 2023-07-10: qty 2

## 2023-07-10 MED ORDER — TORSEMIDE 20 MG PO TABS
20.0000 mg | ORAL_TABLET | Freq: Every day | ORAL | Status: DC
  Administered 2023-07-11: 20 mg via ORAL
  Filled 2023-07-10: qty 1

## 2023-07-10 MED ORDER — HEPARIN BOLUS VIA INFUSION
1400.0000 [IU] | Freq: Once | INTRAVENOUS | Status: AC
  Administered 2023-07-10: 1400 [IU] via INTRAVENOUS
  Filled 2023-07-10: qty 1400

## 2023-07-10 MED ORDER — GABAPENTIN 250 MG/5ML PO SOLN
300.0000 mg | Freq: Three times a day (TID) | ORAL | Status: DC
  Administered 2023-07-10: 300 mg
  Filled 2023-07-10 (×3): qty 6

## 2023-07-10 MED ORDER — GABAPENTIN 250 MG/5ML PO SOLN
100.0000 mg | Freq: Three times a day (TID) | ORAL | Status: DC
  Administered 2023-07-10 – 2023-07-13 (×10): 100 mg
  Filled 2023-07-10 (×11): qty 2

## 2023-07-10 MED ORDER — FUROSEMIDE 10 MG/ML IJ SOLN
60.0000 mg | Freq: Once | INTRAMUSCULAR | Status: AC
  Administered 2023-07-10: 60 mg via INTRAVENOUS
  Filled 2023-07-10: qty 6

## 2023-07-10 NOTE — Progress Notes (Signed)
 PHARMACY - ANTICOAGULATION CONSULT NOTE  Pharmacy Consult for heparin  infusion Indication: h/o DVT & PE  Allergies  Allergen Reactions   Cefepime  Other (See Comments)    Encephalopathy requiring transfer to ICU on 07/05/2023 (not allergy so may use other cephalosporins and beta lactams).     Hydrocodone  Swelling   Hydrocodone -Acetaminophen  Swelling    hands   Iodinated Contrast Media Anaphylaxis   Iodine Anaphylaxis and Swelling    Iv dye  02/03/21-Ispoke to patient and she had IV contrast  X4 last year and has had no problem. ( Last CT abdomen with contrast was on 01/02/21.-   Nitrofurantoin    Other Other (See Comments)    ALLERGY TO METAL - BLACKENS SKIN AND CAUSES A RASH   Tape Swelling and Other (See Comments)    Skin comes off.  Paper tape is ok tegaderm OK   Shellfish-Derived Products    Wound Dressing Adhesive    Peanut Oil Other (See Comments)    ** Nuts cause runny nose   Shellfish Allergy Nausea And Vomiting and Swelling    Episode of GI infection after eating clam chowder. Still eats shrimp and other seafood    Patient Measurements: Height: 5' 8 (172.7 cm) Weight: 124 kg (273 lb 5.9 oz) IBW/kg (Calculated) : 63.9 Heparin  Dosing Weight: 91.1 kg   Vital Signs: Temp: 97.5 F (36.4 C) (06/28 0510) BP: 121/42 (06/28 0510) Pulse Rate: 92 (06/28 0510)  Labs: Recent Labs    07/08/23 0437 07/08/23 1022 07/08/23 1802 07/09/23 0530 07/09/23 1412 07/10/23 0459  HGB  --   --   --  8.4*  --  8.8*  HCT  --   --   --  24.6*  --  24.9*  PLT  --   --   --  490*  --  485*  APTT  --  91*   < > 102* 84* 75*  HEPARINUNFRC  --  0.29*  --  0.29*  --  0.18*  CREATININE 0.92  --   --  1.05*  --  1.13*   < > = values in this interval not displayed.    Estimated Creatinine Clearance: 62.4 mL/min (A) (by C-G formula based on SCr of 1.13 mg/dL (H)).   Medical History: Past Medical History:  Diagnosis Date   Allergic rhinitis    Arthritis    Asthma    problems with  fumes and aerosols cause asthma   Chest pain 10/19/2016   CHF (congestive heart failure) (HCC)    Complication of anesthesia    awakens during surgery; has occurred with last 3-4 surgeries    COPD (chronic obstructive pulmonary disease) (HCC)    DVT (deep venous thrombosis) (HCC) 07/13/2018   Right leg   Environmental allergies    fumes    Fibromyalgia    GERD (gastroesophageal reflux disease)    Headache    History of bronchitis    Hx of total knee arthroplasty 12/13/2015   Hyperlipidemia    Hypertension    Morbid obesity with BMI of 45.0-49.9, adult (HCC) 07/25/2015   NICM (nonischemic cardiomyopathy) (HCC)    a. ? PVC mediated;  b. 06/2016 Echo: EF 25-30%, mild LVH, mild LAE, mild AI/MR/TR/PR; c. 08/2016 Cath: nl cors, EF 25%.   Numbness    hands bilat when driving; improves when not preforming task    Osteoarthritis of left knee 08/02/2015   PE (pulmonary thromboembolism) (HCC) 07/13/2018   Pes anserinus bursitis of left knee 05/18/2016  Presence of right artificial knee joint 05/18/2016   PVC (premature ventricular contraction) 06/04/2017   PVC's (premature ventricular contractions)    a. 11/2016 Amio started;  b. 11/29/16 48h Holter: 57574 PVC's (41%); c. 12/2016 24h Holter: 18040 PVC's (15%); c. 02/2017 48h Holter: 37262 PVC's (41%).   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   Temporal arteritis (HCC) 05/01/2020   Tinnitus    comes and goes    Unilateral primary osteoarthritis, right knee 12/13/2015   Vertigo    none for over 2 yrs    Medications:  Scheduled:   vitamin C   500 mg Per Tube BID   calcium  carbonate  1 tablet Per Tube TID WC   Chlorhexidine  Gluconate Cloth  6 each Topical Daily   copper   2 mg Per Tube Daily   feeding supplement (PROSource TF20)  60 mL Per Tube Daily   free water   30 mL Per Tube Q4H   hydrocortisone  sod  succinate (SOLU-CORTEF ) inj  100 mg Intravenous Q8H   levETIRAcetam   1,000 mg Intravenous Q12H   liver oil-zinc  oxide   Topical BID   metoprolol  succinate  12.5 mg Oral Daily   multivitamin with minerals  1 tablet Per Tube BID   nystatin  cream   Topical BID   mouth rinse  15 mL Mouth Rinse 4 times per day   pantoprazole  (PROTONIX ) IV  40 mg Intravenous QHS   QUEtiapine   25 mg Oral QHS   thiamine   100 mg Per Tube Daily   torsemide   10 mg Oral Daily   vancomycin  variable dose per unstable renal function (pharmacist dosing)   Does not apply See admin instructions   zinc  sulfate (50mg  elemental zinc )  220 mg Per Tube Daily   Infusions:   acyclovir  Stopped (07/09/23 2317)   amiodarone  30 mg/hr (07/10/23 0012)   feeding supplement (OSMOLITE 1.5 CAL) 50 mL/hr at 07/10/23 0030   heparin  950 Units/hr (07/10/23 0000)   meropenem  (MERREM ) IV Stopped (07/09/23 2303)   promethazine  (PHENERGAN ) injection (IM or IVPB)      Assessment: 73 y.o. African-American female with medical history significant for asthma, osteoarthritis, giant cell arthritis, combined systolic and diastolic CHF, COPD, DVT, PE and atrial fibrillation on Eliquis , who presented to the emergency room with acute onset of increased weakness. Last dose of apixaban  07/05/23 0919   Goal of Therapy:  anti-Xa level 0.3-0.7 units/ml aPTT 66 - 102 seconds Monitor platelets by anticoagulation protocol: Yes  06/25 2314 aPTT 161 (verbal), supratherapeutic 06/26 1802 aPTT 96 06/27 0530 aPTT 102, borderline supratherapeutic 06/28 0459 aPTT 75, therapeutic / HL 0.18, subtherapeutic   Plan:  ---aPTT therapeutic, trending down & elevated baseline / subtherapeutic HL ---Bolus 1400 units x 1 ---Increase heparin  infusion to 1150 units/hr  ---recheck aPTT & heparin  level in 8 hrs after rate increase (we will use aPTT to guide therapy until aPTT and anti-Xa level correlate) ---continue to monitor H&H and platelets.   Rankin CANDIE Dills, PharmD,  [facility] 07/10/2023 6:35 AM

## 2023-07-10 NOTE — Progress Notes (Signed)
 PHARMACY - ANTICOAGULATION CONSULT NOTE  Pharmacy Consult for heparin  infusion Indication: h/o DVT & PE  Allergies  Allergen Reactions   Cefepime  Other (See Comments)    Encephalopathy requiring transfer to ICU on 07/05/2023 (not allergy so may use other cephalosporins and beta lactams).     Hydrocodone  Swelling   Hydrocodone -Acetaminophen  Swelling    hands   Iodinated Contrast Media Anaphylaxis   Iodine Anaphylaxis and Swelling    Iv dye  02/03/21-Ispoke to patient and she had IV contrast  X4 last year and has had no problem. ( Last CT abdomen with contrast was on 01/02/21.-   Nitrofurantoin    Other Other (See Comments)    ALLERGY TO METAL - BLACKENS SKIN AND CAUSES A RASH   Tape Swelling and Other (See Comments)    Skin comes off.  Paper tape is ok tegaderm OK   Shellfish-Derived Products    Wound Dressing Adhesive    Peanut Oil Other (See Comments)    ** Nuts cause runny nose   Shellfish Allergy Nausea And Vomiting and Swelling    Episode of GI infection after eating clam chowder. Still eats shrimp and other seafood    Patient Measurements: Height: 5' 8 (172.7 cm) Weight: 124 kg (273 lb 5.9 oz) IBW/kg (Calculated) : 63.9 Heparin  Dosing Weight: 91.1 kg   Vital Signs: Temp: 98.5 F (36.9 C) (06/28 1238) Temp Source: Oral (06/28 1238) BP: 92/66 (06/28 1238) Pulse Rate: 98 (06/28 1238)  Labs: Recent Labs    07/08/23 0437 07/08/23 1022 07/09/23 0530 07/09/23 1412 07/10/23 0459 07/10/23 1452  HGB  --   --  8.4*  --  8.8*  --   HCT  --   --  24.6*  --  24.9*  --   PLT  --   --  490*  --  485*  --   APTT  --    < > 102* 84* 75* 110*  HEPARINUNFRC  --    < > 0.29*  --  0.18* 0.33  CREATININE 0.92  --  1.05*  --  1.13*  --    < > = values in this interval not displayed.    Estimated Creatinine Clearance: 62.4 mL/min (A) (by C-G formula based on SCr of 1.13 mg/dL (H)).   Medical History: Past Medical History:  Diagnosis Date   Allergic rhinitis    Arthritis     Asthma    problems with fumes and aerosols cause asthma   Chest pain 10/19/2016   CHF (congestive heart failure) (HCC)    Complication of anesthesia    awakens during surgery; has occurred with last 3-4 surgeries    COPD (chronic obstructive pulmonary disease) (HCC)    DVT (deep venous thrombosis) (HCC) 07/13/2018   Right leg   Environmental allergies    fumes    Fibromyalgia    GERD (gastroesophageal reflux disease)    Headache    History of bronchitis    Hx of total knee arthroplasty 12/13/2015   Hyperlipidemia    Hypertension    Morbid obesity with BMI of 45.0-49.9, adult (HCC) 07/25/2015   NICM (nonischemic cardiomyopathy) (HCC)    a. ? PVC mediated;  b. 06/2016 Echo: EF 25-30%, mild LVH, mild LAE, mild AI/MR/TR/PR; c. 08/2016 Cath: nl cors, EF 25%.   Numbness    hands bilat when driving; improves when not preforming task    Osteoarthritis of left knee 08/02/2015   PE (pulmonary thromboembolism) (HCC) 07/13/2018   Pes anserinus bursitis  of left knee 05/18/2016   Presence of right artificial knee joint 05/18/2016   PVC (premature ventricular contraction) 06/04/2017   PVC's (premature ventricular contractions)    a. 11/2016 Amio started;  b. 11/29/16 48h Holter: 57574 PVC's (41%); c. 12/2016 24h Holter: 18040 PVC's (15%); c. 02/2017 48h Holter: 37262 PVC's (41%).   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   Temporal arteritis (HCC) 05/01/2020   Tinnitus    comes and goes    Unilateral primary osteoarthritis, right knee 12/13/2015   Vertigo    none for over 2 yrs    Medications:  Scheduled:   vitamin C   500 mg Per Tube BID   calcium  carbonate  1 tablet Per Tube TID WC   Chlorhexidine  Gluconate Cloth  6 each Topical Daily   copper   2 mg Per Tube Daily   feeding supplement (PROSource TF20)  60 mL Per Tube Daily   free water   30 mL Per Tube Q4H    gabapentin   100 mg Per Tube Q8H   hydrocortisone  sod succinate (SOLU-CORTEF ) inj  100 mg Intravenous Q8H   levETIRAcetam   1,000 mg Intravenous Q12H   liver oil-zinc  oxide   Topical BID   metoprolol  succinate  12.5 mg Oral Daily   multivitamin with minerals  1 tablet Per Tube BID   nystatin  cream   Topical BID   mouth rinse  15 mL Mouth Rinse 4 times per day   pantoprazole  (PROTONIX ) IV  40 mg Intravenous QHS   QUEtiapine   25 mg Oral QHS   thiamine   100 mg Per Tube Daily   [START ON 07/11/2023] torsemide   20 mg Oral Daily   vancomycin  variable dose per unstable renal function (pharmacist dosing)   Does not apply See admin instructions   zinc  sulfate (50mg  elemental zinc )  220 mg Per Tube Daily   Infusions:   acyclovir  880 mg (07/10/23 0906)   amiodarone  30 mg/hr (07/10/23 1103)   feeding supplement (OSMOLITE 1.5 CAL) 50 mL/hr at 07/10/23 0030   heparin  1,150 Units/hr (07/10/23 0650)   meropenem  (MERREM ) IV 2 g (07/10/23 0907)   promethazine  (PHENERGAN ) injection (IM or IVPB)      Assessment: 73 y.o. African-American female with medical history significant for asthma, osteoarthritis, giant cell arthritis, combined systolic and diastolic CHF, COPD, DVT, PE and atrial fibrillation on Eliquis , who presented to the emergency room with acute onset of increased weakness. Last dose of apixaban  07/05/23 0919   Goal of Therapy:  anti-Xa level 0.3-0.7 units/ml aPTT 66 - 102 seconds Monitor platelets by anticoagulation protocol: Yes  06/25 2314 aPTT 161 (verbal), supratherapeutic 06/26 1802 aPTT 96 06/27 0530 aPTT 102, borderline supratherapeutic 06/28 0459 aPTT 75, therapeutic / HL 0.18, subtherapeutic 06/28 1452 aPTT 110/HL0.33   Plan:  ---aPTT is supratherapeutic and not correlating with HL ---Decrease heparin  infusion to 1050 units/hr  ---recheck aPTT & heparin  level in 8 hrs after rate increase (we will use aPTT to guide therapy until aPTT and anti-Xa level correlate) ---continue  to monitor H&H and platelets.   Olam Fritter, PharmD, BCPS 07/10/2023 3:34 PM

## 2023-07-10 NOTE — Progress Notes (Signed)
 NEUROLOGY CONSULT FOLLOW UP NOTE   Date of service: July 10, 2023 Patient Name: MELLODY MASRI MRN:  [id number] DOB:  [birth year]  Interval Hx/subjective  Sister is at bedside this morning. The patient is now following instructions and briefly making eye contact, with dysarthric one-word answers to some simple questions.   Vitals   Vitals:   07/10/23 0100 07/10/23 0500 07/10/23 0510 07/10/23 0819  BP:   (!) 121/42 105/84  Pulse:   92 100  Resp: (!) 65  18 (!) 22  Temp:   (!) 97.5 F (36.4 C) 97.6 F (36.4 C)  TempSrc:    Oral  SpO2:   100% 100%  Weight:  124 kg    Height:         Body mass index is 41.57 kg/m.  Physical Exam   Constitutional: Ill-appearing, morbidly obese elderly female  Eyes: No scleral injection.  HENT: No OP obstruction.  Head: Normocephalic.  Respiratory: Effort normal, non-labored breathing.  Ext: Pitting edema to feet bilaterally > pretibially   Neurological Examination Mental Status: Awake and interacting with sister by turning her head in sister's direction and making brief eye contact. Moderate-to-Severely encephalopathic. She does respond to her name. Was able to count five fingers and say five although this was hypophonic, dysarthric and difficult to discern. She will follow commands to grip with her hands and stick out tongue (does so weakly). Will make brief eye contact. Not able to follow complex commands or answer questions with > 1 word answers. Exam improved since yesterday.   Cranial Nerves: II: PERRL 5 mm >> 3 mm. Makes brief eye contact. Can count fingers.  III,IV, VI: Eyes are conjugate and at the midline. No nystagmus. Will glance to the left and right in response to stimuli from those directions.  V: Reacts to eyelid stimulation bilaterally VII: Grimaces symmetrically VIII: Responds to voice IX,X: Gag reflex deferred XI: Unable to assess XII: Unable to assess Motor/Sensory: BUE: Weak withdrawal to pinch equally  bilaterally BLE: Weak withdrawal to pinch equally bilaterally Chronic flexion contractures to ankles bilaterally.  No myoclonus or tremor noted.  Deep Tendon Reflexes: 1+ and symmetric bilateral brachioradialis. Unable to elicit patellar reflexes due to prior knee replacements Cerebellar: Unable to assess Gait: Unable to assess  Medications  Current Facility-Administered Medications:    acetaminophen  (TYLENOL ) tablet 650 mg, 650 mg, Oral, Q6H PRN, 650 mg at 07/05/23 0136 **OR** acetaminophen  (TYLENOL ) suppository 650 mg, 650 mg, Rectal, Q6H PRN, Mansy, Jan A, MD   acyclovir  (ZOVIRAX ) 880 mg in dextrose  5 % 250 mL IVPB, 10 mg/kg (Adjusted), Intravenous, Q12H, Zeigler, Dustin G, RPH, Last Rate: 267.6 mL/hr at 07/10/23 0906, 880 mg at 07/10/23 0906   albuterol  (PROVENTIL ) (2.5 MG/3ML) 0.083% nebulizer solution 2.5 mg, 2.5 mg, Nebulization, Q6H PRN, Mansy, Jan A, MD   [COMPLETED] amiodarone  (NEXTERONE ) 1.8 mg/mL load via infusion 150 mg, 150 mg, Intravenous, Once, 150 mg at 07/07/23 0956 **FOLLOWED BY** [EXPIRED] amiodarone  (NEXTERONE  PREMIX) 360-4.14 MG/200ML-% (1.8 mg/mL) IV infusion, 60 mg/hr, Intravenous, Continuous, Stopped at 07/07/23 1630 **FOLLOWED BY** amiodarone  (NEXTERONE  PREMIX) 360-4.14 MG/200ML-% (1.8 mg/mL) IV infusion, 30 mg/hr, Intravenous, Continuous, Gollan, Timothy J, MD, Last Rate: 16.67 mL/hr at 07/10/23 0012, 30 mg/hr at 07/10/23 0012   ascorbic acid  (VITAMIN C ) tablet 500 mg, 500 mg, Per Tube, BID, Zhang, Dekui, MD, 500 mg at 07/10/23 9081   calcium  carbonate (TUMS - dosed in mg elemental calcium ) chewable tablet 200 mg of elemental calcium , 1 tablet, Per Tube, TID  WC, Laurita Pillion, MD, 200 mg of elemental calcium  at 07/10/23 9146   Chlorhexidine  Gluconate Cloth 2 % PADS 6 each, 6 each, Topical, Daily, Dorinda Drue DASEN, MD, 6 each at 07/10/23 9095   copper  tablet 2 mg, 2 mg, Per Tube, Daily, Zhang, Dekui, MD, 2 mg at 07/10/23 9096   digoxin  (LANOXIN ) 0.25 MG/ML injection 0.25 mg,  0.25 mg, Intravenous, Q2H PRN, Cleatus Delayne GAILS, MD, 0.25 mg at 07/07/23 9352   diphenhydrAMINE  (BENADRYL ) capsule 25 mg, 25 mg, Oral, Q6H PRN, Mansy, Jan A, MD   feeding supplement (OSMOLITE 1.5 CAL) liquid 1,000 mL, 1,000 mL, Per Tube, Continuous, Laurita Pillion, MD, Last Rate: 50 mL/hr at 07/10/23 0030, Rate Change at 07/10/23 0030   feeding supplement (PROSource TF20) liquid 60 mL, 60 mL, Per Tube, Daily, Laurita Pillion, MD, 60 mL at 07/10/23 9095   free water  30 mL, 30 mL, Per Tube, Q4H, Laurita Pillion, MD, 30 mL at 07/10/23 0853   furosemide  (LASIX ) injection 60 mg, 60 mg, Intravenous, Once, Laurita Pillion, MD   gabapentin  (NEURONTIN ) 250 MG/5ML solution 100 mg, 100 mg, Per Tube, Q8H, Thecla Forgione, MD   heparin  ADULT infusion 100 units/mL (25000 units/250mL), 1,150 Units/hr, Intravenous, Continuous, Belue, Rankin RAMAN, RPH, Last Rate: 11.5 mL/hr at 07/10/23 0650, 1,150 Units/hr at 07/10/23 0650   hydrocortisone  sodium succinate  (SOLU-CORTEF ) 100 MG injection 100 mg, 100 mg, Intravenous, Q8H, Laurita Pillion, MD, 100 mg at 07/10/23 9472   HYDROmorphone  (DILAUDID ) injection 0.5 mg, 0.5 mg, Intravenous, Q4H PRN, Zhang, Dekui, MD, 0.5 mg at 07/07/23 1056   levETIRAcetam  (KEPPRA ) undiluted injection 1,000 mg, 1,000 mg, Intravenous, Q12H, Djan, Prince T, MD, 1,000 mg at 07/10/23 0855   liver oil-zinc  oxide (DESITIN) 40 % ointment, , Topical, BID, Mansy, Jan A, MD, Given at 07/10/23 0905   liver oil-zinc  oxide (DESITIN) 40 % ointment, , Topical, PRN, Mansy, Jan A, MD   magnesium  hydroxide (MILK OF MAGNESIA) suspension 30 mL, 30 mL, Oral, Daily PRN, Mansy, Jan A, MD   melatonin tablet 5 mg, 5 mg, Oral, QHS PRN, Mansy, Jan A, MD   meropenem  (MERREM ) 2 g in sodium chloride  0.9 % 100 mL IVPB, 2 g, Intravenous, Q12H, Zeigler, Dustin G, RPH, Last Rate: 280 mL/hr at 07/10/23 0907, 2 g at 07/10/23 0907   metoprolol  succinate (TOPROL -XL) 24 hr tablet 12.5 mg, 12.5 mg, Oral, Daily, Djan, Prince T, MD, 12.5 mg at 07/10/23  9096   metoprolol  tartrate (LOPRESSOR ) injection 5 mg, 5 mg, Intravenous, Q6H PRN, Dorinda Drue DASEN, MD, 5 mg at 07/07/23 9367   multivitamin with minerals tablet 1 tablet, 1 tablet, Per Tube, BID, Zhang, Dekui, MD, 1 tablet at 07/10/23 9145   nystatin  cream (MYCOSTATIN ), , Topical, BID, Djan, Prince T, MD, Given at 07/10/23 9094   ondansetron  (ZOFRAN ) tablet 4 mg, 4 mg, Oral, Q6H PRN **OR** ondansetron  (ZOFRAN ) injection 4 mg, 4 mg, Intravenous, Q6H PRN, Mansy, Jan A, MD, 4 mg at 07/04/23 1223   Oral care mouth rinse, 15 mL, Mouth Rinse, 4 times per day, Laurita Pillion, MD, 15 mL at 07/10/23 9145   Oral care mouth rinse, 15 mL, Mouth Rinse, PRN, Laurita Pillion, MD   oxyCODONE -acetaminophen  (PERCOCET/ROXICET) 5-325 MG per tablet 1 tablet, 1 tablet, Oral, Q6H PRN, 1 tablet at 07/05/23 1123 **AND** oxyCODONE  (Oxy IR/ROXICODONE ) immediate release tablet 5 mg, 5 mg, Oral, Q6H PRN, Mansy, Jan A, MD, 5 mg at 07/03/23 2158   pantoprazole  (PROTONIX ) injection 40 mg, 40 mg, Intravenous, QHS, Nada Doughty  D, RPH, 40 mg at 07/09/23 2215   polyethylene glycol (MIRALAX  / GLYCOLAX ) packet 17 g, 17 g, Oral, Daily PRN, Mansy, Jan A, MD   promethazine  (PHENERGAN ) 12.5 mg in sodium chloride  0.9 % 50 mL IVPB, 12.5 mg, Intravenous, Q6H PRN, Dorinda, Drue DASEN, MD   QUEtiapine  (SEROQUEL ) tablet 25 mg, 25 mg, Oral, QHS, Laurita Pillion, MD, 25 mg at 07/09/23 2216   thiamine  (VITAMIN B1) tablet 100 mg, 100 mg, Per Tube, Daily, Laurita Pillion, MD, 100 mg at 07/10/23 9095   torsemide  (DEMADEX ) tablet 10 mg, 10 mg, Oral, Daily, Mansy, Jan A, MD, 10 mg at 07/10/23 9096   vancomycin  variable dose per unstable renal function (pharmacist dosing), , Does not apply, See admin instructions, Dail Rankin RAMAN, RPH   zinc  sulfate (50mg  elemental zinc ) capsule 220 mg, 220 mg, Per Tube, Daily, Laurita Pillion, MD, 220 mg at 07/10/23 0904  Labs and Diagnostic Imaging   CBC:  Recent Labs  Lab 07/06/23 1017 07/07/23 0318 07/09/23 0530 07/10/23 0459   WBC 10.7* 8.3 6.1 7.1  NEUTROABS 8.8* 6.7  --   --   HGB 8.9* 9.3* 8.4* 8.8*  HCT 27.0* 27.6* 24.6* 24.9*  MCV 80.1 79.3* 76.9* 77.3*  PLT 460* 494* 490* 485*    Basic Metabolic Panel:  Lab Results  Component Value Date   NA 136 07/10/2023   K 3.4 (L) 07/10/2023   CO2 22 07/10/2023   GLUCOSE 126 (H) 07/10/2023   BUN 23 07/10/2023   CREATININE 1.13 (H) 07/10/2023   CALCIUM  7.8 (L) 07/10/2023   GFRNONAA 52 (L) 07/10/2023   GFRAA >60 06/07/2019   Lipid Panel:  Lab Results  Component Value Date   LDLCALC 45 03/08/2023   HgbA1c:  Lab Results  Component Value Date   HGBA1C 4.9 03/07/2023   Urine Drug Screen: No results found for: LABOPIA, COCAINSCRNUR, LABBENZ, AMPHETMU, THCU, LABBARB  Alcohol  Level No results found for: [facility] INR  Lab Results  Component Value Date   INR 1.8 (H) 07/05/2023   APTT  Lab Results  Component Value Date   APTT 75 (H) 07/10/2023     Assessment  DAVIAN WOLLENBERG is a 73 y.o. female admitted for sepsis and encephalopathy with new onset twitching, complicated by adrenal crisis and tachycardia. Her encephalopathy initially progressively worsened, but seems to no longer be worsening as of today (Thursday).  - Exam with findings that are consistent with a moderate-to-severe diffuse encephalopathy. She is improved relative to yesterday, now being able to follow simple commands and count fingers.  - Imaging: - CT head (6/19): Generalized cerebral atrophy and microvascular disease changes of the supratentorial brain. No acute intracranial abnormality. - MRI brain w/wo contrast (personally reviewed): Moderate to severely motion limited study without evidence of obvious acute abnormality. No abnormal enhancement is seen. No temporal lobe signal abnormalities.  - Of note, she has an allergy to iodinated contrast (anaphylaxis) - EEGs: - EEG (6/20):  Intermittent slow, generalized. This study is suggestive of mild diffuse encephalopathy. No  seizures or epileptiform discharges were seen throughout the recording. - EEG (6/23): Abnormalities seen consist of generalized periodic discharges with triphasic morphology (GPDs) in addition to continuous generalized slowing. Overall, this study is suggestive of moderate diffuse encephalopathy typically related to toxic-metabolic causes like cefepime  toxicity. No seizures or epileptiform discharges were seen throughout the recording. - EEG (6/27):  Continuous slow, generalized. This study is suggestive of moderate diffuse encephalopathy. No seizures or epileptiform discharges were seen throughout the recording. -  Labs: - WBC today normal at 7.1. - Recent B12 level was elevated - Recent vitamin A  level was low at 9.4 - Recent zinc  level was low at 29 - AM cortisol on 6/20 was low at 2.5.  - ID is consulting. They doubt a meningitis, but LP cannot be done to rule this out.  - Impression: - Most likely underlying etiologies for her worsened encephalopathy include meningitis, septic encephalopathy and residual cefepime -neurotoxicity. Cefepime  was stopped on 6/23. There may be a component of hospital delirium as well.  - Initial spell in the ED was most likely syncope secondary to hypotension. She was awake and fully conversant on admission, deteriorating during her stay while being treated for infection.  - Her diffuse limb jerking with stimulation is now resolved. This may have been secondary to Neurontin  toxicity. However, case reports do document rare occurrence of Neurontin -withdrawal encephalopathy. Therefore, Neurontin  is being restarted at the lower dose of 100 mg TID.  - Seizure with postictal state is also a possible etiology for her encephalopathy, but this is difficult to differentiate from infection (aka septic encephalopathy) or cefepime  neurotoxicity as the etiology for her encephalopathy. Her infection could certainly lower her seizure threshold, but seizure is now lower on the DDx.  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  - Prior history of giant cell arteritis. No stroke on MRI this admission.  - Other active Problems: COPD, lower urinary tract infection, hypotension, peripheral neuropathy, paroxysmal atrial fibrillation, reactive thrombocytosis, adrenal crisis, chronically bedbound with chronic ankle contractures   Recommendations  - Continue Keppra  at 1000 mg BID - Restarting Neurontin  at the lower dose of 100 mg TID - Currently on meropenem , vancomycin  and acyclovir  per ID recommendations.  - Unable to obtain LP due to anticoagulation - Vitamin and mineral supplementation per Dietician consultant - Neurology will continue to follow with you ______________________________________________________________________   Bonney SHARK, Jevan Gaunt, MD [facility]

## 2023-07-10 NOTE — Progress Notes (Addendum)
 Progress Note   Patient: Wendy Sanchez FMW:[id number] DOB: 01-04-1951 DOA: 07/01/2023     9 DOS: the patient was seen and examined on 07/10/2023   Brief hospital course: From HPI Wendy Sanchez is a 73 y.o. African-American female with medical history significant for asthma, osteoarthritis, giant cell arthritis, combined systolic and diastolic CHF, COPD, DVT, PE and atrial fibrillation on Eliquis , who presented to the emergency room with acute onset of increased weakness.  Patient was hypotensive upon arriving to hospital, she also had a syncope due to hypotension. Patient also has tachycardia, tachypnea and leukocytosis.  She also has severe altered mental status, EEG did not show any evidence of seizure.  MRI of the brain poor quality, but did not show any acute changes.  LP was performed, could not collect any CSF. Her urine culture grow E. coli Serratia and Pseudomonas.  Patient was placed on broad-spectrum antibiotics.     Principal Problem:   Sepsis due to gram-negative UTI [place]) Active Problems:   Lower urinary tract infection   Syncope   Hypotension   Chronic obstructive pulmonary disease (COPD) (HCC)   Peripheral neuropathy   Paroxysmal atrial fibrillation (HCC)   GERD without esophagitis   Asthma-COPD overlap syndrome (HCC)   Reactive thrombocytosis   Adrenal crisis (HCC)   Weakness   Encephalopathy   Acute metabolic encephalopathy   Assessment and Plan:  Severe sepsis due to gram-negative UTI (HCC) Gram-negative urinary tract infection. Stage III sacral decubitus ulcer - Sepsis manifested by leukocytosis, tachycardia and tachypnea.  Patient also had a severe lactic acidosis of 4.5. Patient received sepsis fluid in the emergency room Sepsis also compounded by decubitus sacral ulcer Wound care on board; we appreciate input Culture results showing Pseudomonas in the wound, urine cultures growing E. coli, Serratia as well as Pseudomonas Patient is seen by  infectious disease, currently treated with meropenem  and vancomycin .   Continue current treatment.  May consider change to oral antibiotics at home.   Acute metabolic encephalopathy rule out possible bacterial meningitis or encephalitis Noted to have some tremors involving the right face and legs as well as bilateral upper extremities EEG obtained that did not show any epileptiform waves Patient initiated on Keppra  and neurology was consulted LP was attempted by IR today however unsuccessful Currently patient on vancomycin , meropenem  and acyclovir  According to ID pharmacist meropenem  will also cover Listeria (no need for ampicillin ) MRI of the brain with and without contrast showed a moderate motion artifact, but no apparent acute changes. Patient has been followed by neurology on a daily basis.  Discussed with Dr. Lindzen, this is unlikely due to cefepime  toxicity.  More likely due to metabolic encephalopathy from infection.  However, we will continue cover for meningitis. Repeated EEG 6/26 still show significant encephalopathy, no seizure activity. Dobbhoff was placed 6/26, tube feeding started. 6/28.  Patient was restarted on Neurontin  today, mental status suddenly improved.  Looks like this is caused by Neurontin  withdrawal.  Meningitis at this moment seems unlikely.  If patient continue to improve, may consider scale back antibiotics.  Acute on chronic combined systolic and diastolic congestive heart failure. Patient had echocardiogram performed on 6/20, ejection fraction 45 to 50% with grade 1 diastolic dysfunction. Patient started having worsening short of breath today.  No significant hypoxemia.  She also be developing worsening bilateral leg edema.  This is probably from the fluid she is getting from hospital.  I will give her a dose of IV Lasix  at 60 mg x  1.  Reassess for additional need daily.  Anemia of chronic disease. Reactive thrombocytopenia. Recent lab test showed slight  decrease iron with elevated ferritin, elevated B12 level.  Condition consistent with anemia of chronic disease.  Continue to follow.   Syncope likely secondary to hypotension Adrenal crisis. Hypoglycemia secondary to adrenal crisis Continue neurochecks closely Echo showed EF 45 to 50% with grade 1 diastolic dysfunction Patient not fluid overloaded Patient had cortisol level of 2.5, she also had significant hypotension, hypoglycemia.  She has adrenal crisis.  She was chronically taking lower dose of Cortef , started stress dose hydrocortisone  at 100 mg every 8 hours 6/25. I have discontinued midodrine , blood pressure has been since stable.   Hypokalemia Hyponatremia. Sodium and potassium level normalized.  Slightly worsened renal function, give additional potassium dose    Chronic obstructive pulmonary disease (COPD) (HCC) ABG did not show any evidence of CO2 retention. Continue her as needed DuoNebs.   Peripheral neuropathy Restarted Neurontin .   GERD without esophagitis Continue PPI therapy   Paroxysmal atrial fibrillation (HCC) with RVR. Patient is followed by cardiology, heart rate more stable.  Still on heparin  drip, amiodarone .  Beta-blocker.  Heart rate better controlled.   Class II obesity with BMI 37.78. Complicated patient care as well as prognosis    Pressure ulcer POA  Stage 2 pressure ulcers bilateral ischial tuberosity     Subjective:  Patient doing much better today, she woke up.  But still has some confusion  Physical Exam: Vitals:   07/10/23 0100 07/10/23 0500 07/10/23 0510 07/10/23 0819  BP:   (!) 121/42 105/84  Pulse:   92 100  Resp: (!) 65  18 (!) 22  Temp:   (!) 97.5 F (36.4 C) 97.6 F (36.4 C)  TempSrc:    Oral  SpO2:   100% 100%  Weight:  124 kg    Height:       General exam: Appears calm and comfortable, morbid obese. Respiratory system: Decreased breath sounds. Respiratory effort normal. Cardiovascular system: S1 & S2 heard, RRR. No JVD,  murmurs, rubs, gallops or clicks.  Gastrointestinal system: Abdomen is nondistended, soft and nontender. No organomegaly or masses felt. Normal bowel sounds heard. Central nervous system: Alert and oriented x1. No focal neurological deficits. Extremities: 2+ leg edema Skin: No rashes, lesions or ulcers Psychiatry: Flat affect   Data Reviewed:  Lab results reviewed.  Family Communication: Sister updated the bedside.  Disposition: Status is: Inpatient Remains inpatient appropriate because: Severity of disease, altered mental status.     Time spent: 55 minutes  Author: Murvin Mana, MD 07/10/2023 11:25 AM  For on call review [email or web address].

## 2023-07-10 NOTE — Progress Notes (Signed)
 Rounding Note   Patient Name: Wendy Sanchez Date of Encounter: 07/10/2023  [facility] Cardiologist: Evalene Lunger, MD   Subjective Transferred out of the ICU on 6/27. No acute overnight cardiac events. More alert today, will not respond to questions. Remains on amiodarone  gtt (AMS).  Scheduled Meds:  vitamin C   500 mg Per Tube BID   calcium  carbonate  1 tablet Per Tube TID WC   Chlorhexidine  Gluconate Cloth  6 each Topical Daily   copper   2 mg Per Tube Daily   feeding supplement (PROSource TF20)  60 mL Per Tube Daily   free water   30 mL Per Tube Q4H   gabapentin   300 mg Per Tube Q8H   hydrocortisone  sod succinate (SOLU-CORTEF ) inj  100 mg Intravenous Q8H   levETIRAcetam   1,000 mg Intravenous Q12H   liver oil-zinc  oxide   Topical BID   metoprolol  succinate  12.5 mg Oral Daily   multivitamin with minerals  1 tablet Per Tube BID   nystatin  cream   Topical BID   mouth rinse  15 mL Mouth Rinse 4 times per day   pantoprazole  (PROTONIX ) IV  40 mg Intravenous QHS   potassium chloride   40 mEq Per Tube Once   QUEtiapine   25 mg Oral QHS   thiamine   100 mg Per Tube Daily   torsemide   10 mg Oral Daily   vancomycin  variable dose per unstable renal function (pharmacist dosing)   Does not apply See admin instructions   zinc  sulfate (50mg  elemental zinc )  220 mg Per Tube Daily   Continuous Infusions:  acyclovir  Stopped (07/09/23 2317)   amiodarone  30 mg/hr (07/10/23 0012)   feeding supplement (OSMOLITE 1.5 CAL) 50 mL/hr at 07/10/23 0030   heparin  1,150 Units/hr (07/10/23 0650)   meropenem  (MERREM ) IV Stopped (07/09/23 2303)   promethazine  (PHENERGAN ) injection (IM or IVPB)     PRN Meds: acetaminophen  **OR** acetaminophen , albuterol , digoxin , diphenhydrAMINE , HYDROmorphone  (DILAUDID ) injection, liver oil-zinc  oxide, magnesium  hydroxide, melatonin, metoprolol  tartrate, ondansetron  **OR** ondansetron  (ZOFRAN ) IV, mouth rinse, oxyCODONE -acetaminophen  **AND** oxyCODONE ,  polyethylene glycol, promethazine  (PHENERGAN ) injection (IM or IVPB)   Vital Signs  Vitals:   07/10/23 0000 07/10/23 0100 07/10/23 0500 07/10/23 0510  BP:    (!) 121/42  Pulse:    92  Resp: (!) 23 (!) 65  18  Temp:    (!) 97.5 F (36.4 C)  TempSrc:      SpO2:    100%  Weight:   124 kg   Height:        Intake/Output Summary (Last 24 hours) at 07/10/2023 0809 Last data filed at 07/10/2023 0500 Gross per 24 hour  Intake 1792.5 ml  Output 1700 ml  Net 92.5 ml      07/10/2023    5:00 AM 07/09/2023    3:40 AM 07/06/2023    5:00 AM  Last 3 Weights  Weight (lbs) 273 lb 5.9 oz 259 lb 11.2 oz 248 lb 7.3 oz  Weight (kg) 124 kg 117.8 kg 112.7 kg      Telemetry Normal sinus rhythm with PACs and PVCs and a brief run of SVT/atrial tachycardia - Personally Reviewed  ECG  No new tracings - Personally Reviewed  Physical Exam  GEN: No acute distress; more alert today with eyes open Neck: No JVD Cardiac: RRR, no murmurs, rubs, or gallops.  Respiratory: Diminished and coarse breath sounds bilaterally GI: Soft, non-distended  MS: Mild bilateral lower extremity edema; No deformity. Unable to purposely move her extremities  Neuro: Full exam not performed Psych: Alert; otherwise will not respond to questions/commands   Labs High Sensitivity Troponin:   Recent Labs  Lab 07/01/23 1936  TROPONINIHS 30*     Chemistry Recent Labs  Lab 07/05/23 2336 07/06/23 1017 07/06/23 1017 07/07/23 0318 07/08/23 0437 07/09/23 0530 07/10/23 0459  NA 136 138  --  135 133* 136 136  K 3.1* 2.8*  --  3.2* 3.9 3.6 3.4*  CL 105 107  --  106 103 105 105  CO2 24 25  --  21* 24 25 22   GLUCOSE 90 75  --  70 112* 139* 126*  BUN 22 19  --  16 16 20 23   CREATININE 0.88 0.83  --  0.70 0.92 1.05* 1.13*  CALCIUM  7.4* 7.4*  --  7.6* 7.8* 7.7* 7.8*  MG  --   --    < > 1.7 2.0 2.0 2.2  PROT 4.3* 4.2*  --   --   --   --   --   ALBUMIN  <1.5* <1.5*  --  <1.5*  --   --   --   AST 24 23  --   --   --   --   --    ALT 16 14  --   --   --   --   --   ALKPHOS 123 122  --   --   --   --   --   BILITOT 0.3 0.6  --   --   --   --   --   GFRNONAA >60 >60  --  >60 >60 56* 52*  ANIONGAP 7 6  --  8 6 6 9    < > = values in this interval not displayed.    Lipids No results for input(s): CHOL, TRIG, HDL, LABVLDL, LDLCALC, CHOLHDL in the last 168 hours.  Hematology Recent Labs  Lab 07/07/23 0318 07/09/23 0530 07/10/23 0459  WBC 8.3 6.1 7.1  RBC 3.48* 3.20* 3.22*  HGB 9.3* 8.4* 8.8*  HCT 27.6* 24.6* 24.9*  MCV 79.3* 76.9* 77.3*  MCH 26.7 26.3 27.3  MCHC 33.7 34.1 35.3  RDW 18.3* 17.6* 17.9*  PLT 494* 490* 485*   Thyroid  No results for input(s): TSH, FREET4 in the last 168 hours.  BNPNo results for input(s): BNP, PROBNP in the last 168 hours.  DDimer No results for input(s): DDIMER in the last 168 hours.   Radiology  EEG adult Result Date: 07/09/2023 IMPRESSION: This study is suggestive of moderate diffuse encephalopathy. No seizures or epileptiform discharges were seen throughout the recording. Arlin MALVA Krebs   DG Abd Portable 1V Result Date: 07/08/2023 IMPRESSION: Tip of the weighted enteric tube just to the right of midline in the region of the distal stomach or proximal duodenum. Electronically Signed   By: Andrea Gasman M.D.   On: 07/08/2023 12:23    Cardiac Studies  2D echo 07/02/2023: 1. Left ventricular ejection fraction, by estimation, is 45 to 50%. The  left ventricle has mildly decreased function. The left ventricle  demonstrates global hypokinesis. The left ventricular internal cavity size  was mildly dilated. There is mild  concentric left ventricular hypertrophy. Left ventricular diastolic  parameters are consistent with Grade I diastolic dysfunction (impaired  relaxation).   2. Right ventricular systolic function is normal. The right ventricular  size is normal.   3. A small pericardial effusion is present. The pericardial effusion is  posterior to  the left ventricle.  4. The mitral valve is grossly normal. Trivial mitral valve  regurgitation.   5. The aortic valve is grossly normal. Aortic valve regurgitation is not  visualized.   Patient Profile   Ms. 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 difficile, presenting with weakness, UTI, worsening sacral wound, syncope in the ER Several episodes of narrow complex tachycardia lasting less than 1 hour consistent with SVT, previously treated with amiodarone   Assessment & Plan  Paroxysmal SVT -Known history of SVT, previously placed on amiodarone  at hospital discharge March 2025. She stopped this as an outpatient as prescription ran out -Multiple episodes of SVT up to 45 minutes to 1 hour on several occasions this admission; overall burden much improved -Unable to tolerate pills at this time given encephalopathy -July 07, 2023 started with amiodarone  bolus and infusion, continue amiodarone  gtt at this time -Transition to oral amiodarone  once mental status has improved -Appears to be tolerating well  HFmrEF: Receiving metoprolol  and torsemide   Escalate as able   Encephalopathy Starting June 23 in the setting of UTI, sacral decub ulcer, septic picture, urology following EEG with moderate diffuse encephalopathy, MRI brain completed with motion artifact   Sepsis due to UTI, stage III sacral decubitus ulcer Presenting with tachycardia, tachypnea, leukocytosis, lactic acidosis on broad-spectrum antibiotics   History of DVT/PE -High risk for recurrent event, on Eliquis  as outpatient, unable to tolerate pills -On heparin  infusion   For questions or updates, please contact [facility] Please consult  [email or web address] for contact info under     Signed, Bernardino Bring, PA-C  07/10/2023, 8:09 AM

## 2023-07-10 NOTE — Progress Notes (Signed)
 PHARMACY CONSULT NOTE - FOLLOW UP  Pharmacy Consult for Electrolyte Monitoring and Replacement   Recent Labs: Potassium (mmol/L)  Date Value  07/10/2023 3.4 (L)   Magnesium  (mg/dL)  Date Value  93/71/7974 2.2   Calcium  (mg/dL)  Date Value  93/71/7974 7.8 (L)   Albumin  (g/dL)  Date Value  93/74/7974 <1.5 (L)  02/25/2017 3.7   Phosphorus (mg/dL)  Date Value  93/73/7974 2.8   Sodium (mmol/L)  Date Value  07/10/2023 136  04/11/2020 140    Assessment:  73 y.o. African-American female with medical history significant for asthma, osteoarthritis, giant cell arthritis, combined systolic and diastolic CHF, COPD, DVT, PE and atrial fibrillation on Eliquis , who presented to the emergency room with acute onset of increased weakness. Pharmacy is asked to follow and replace electrolytes while in CCU  Free water  30 ml q4H Tube feeds Amio gtt.  Acyclovir  IV - nephrotoxic. Scr trending up. No Fluids ordered based on ID rph recs.   Diuretics: torsemide  10 mg po once daily  Goal of Therapy:  Electrolytes WNL  Plan:  Kcl 40 mEq x 1 F/u with AM labs.   Wendy Sanchez ,PharmD Clinical Pharmacist 07/10/2023 9:38 AM

## 2023-07-10 NOTE — Plan of Care (Signed)
  Problem: Fluid Volume: Goal: Hemodynamic stability will improve Outcome: Progressing   Problem: Clinical Measurements: Goal: Diagnostic test results will improve Outcome: Progressing   Problem: Clinical Measurements: Goal: Signs and symptoms of infection will decrease Outcome: Progressing   Problem: Respiratory: Goal: Ability to maintain adequate ventilation will improve Outcome: Progressing   Problem: Education: Goal: Knowledge of General Education information will improve Description: Including pain rating scale, medication(s)/side effects and non-pharmacologic comfort measures Outcome: Progressing   Problem: Clinical Measurements: Goal: Ability to maintain clinical measurements within normal limits will improve Outcome: Progressing   Problem: Clinical Measurements: Goal: Will remain free from infection Outcome: Progressing   Problem: Clinical Measurements: Goal: Diagnostic test results will improve Outcome: Progressing   Problem: Clinical Measurements: Goal: Respiratory complications will improve Outcome: Progressing   Problem: Clinical Measurements: Goal: Cardiovascular complication will be avoided Outcome: Progressing   Problem: Nutrition: Goal: Adequate nutrition will be maintained Outcome: Progressing   Problem: Skin Integrity: Goal: Risk for impaired skin integrity will decrease Outcome: Progressing   Problem: Safety: Goal: Ability to remain free from injury will improve Outcome: Progressing    Plan of care, assessment, monitoring, treatment, and intervention (s) ongoing, see MAR see flowsheet

## 2023-07-11 ENCOUNTER — Inpatient Hospital Stay

## 2023-07-11 DIAGNOSIS — E272 Addisonian crisis: Secondary | ICD-10-CM | POA: Diagnosis not present

## 2023-07-11 DIAGNOSIS — N390 Urinary tract infection, site not specified: Secondary | ICD-10-CM | POA: Diagnosis not present

## 2023-07-11 DIAGNOSIS — G039 Meningitis, unspecified: Secondary | ICD-10-CM | POA: Diagnosis not present

## 2023-07-11 DIAGNOSIS — A4150 Gram-negative sepsis, unspecified: Secondary | ICD-10-CM | POA: Diagnosis not present

## 2023-07-11 DIAGNOSIS — G9340 Encephalopathy, unspecified: Secondary | ICD-10-CM | POA: Diagnosis not present

## 2023-07-11 DIAGNOSIS — I5043 Acute on chronic combined systolic (congestive) and diastolic (congestive) heart failure: Secondary | ICD-10-CM | POA: Diagnosis not present

## 2023-07-11 DIAGNOSIS — I471 Supraventricular tachycardia, unspecified: Secondary | ICD-10-CM | POA: Diagnosis not present

## 2023-07-11 DIAGNOSIS — A419 Sepsis, unspecified organism: Secondary | ICD-10-CM | POA: Diagnosis not present

## 2023-07-11 LAB — BASIC METABOLIC PANEL WITH GFR
Anion gap: 8 (ref 5–15)
BUN: 27 mg/dL — ABNORMAL HIGH (ref 8–23)
CO2: 25 mmol/L (ref 22–32)
Calcium: 7.8 mg/dL — ABNORMAL LOW (ref 8.9–10.3)
Chloride: 107 mmol/L (ref 98–111)
Creatinine, Ser: 1.14 mg/dL — ABNORMAL HIGH (ref 0.44–1.00)
GFR, Estimated: 51 mL/min — ABNORMAL LOW (ref >60)
Glucose, Bld: 138 mg/dL — ABNORMAL HIGH (ref 70–99)
Potassium: 2.9 mmol/L — ABNORMAL LOW (ref 3.5–5.1)
Sodium: 140 mmol/L (ref 135–145)

## 2023-07-11 LAB — CBC
HCT: 24 % — ABNORMAL LOW (ref 36.0–46.0)
Hemoglobin: 8.3 g/dL — ABNORMAL LOW (ref 12.0–15.0)
MCH: 26.9 pg (ref 26.0–34.0)
MCHC: 34.6 g/dL (ref 30.0–36.0)
MCV: 77.7 fL — ABNORMAL LOW (ref 80.0–100.0)
Platelets: 412 10*3/uL — ABNORMAL HIGH (ref 150–400)
RBC: 3.09 MIL/uL — ABNORMAL LOW (ref 3.87–5.11)
RDW: 18.6 % — ABNORMAL HIGH (ref 11.5–15.5)
WBC: 7.6 10*3/uL (ref 4.0–10.5)
nRBC: 0 % (ref 0.0–0.2)

## 2023-07-11 LAB — GLUCOSE, CAPILLARY
Glucose-Capillary: 128 mg/dL — ABNORMAL HIGH (ref 70–99)
Glucose-Capillary: 134 mg/dL — ABNORMAL HIGH (ref 70–99)
Glucose-Capillary: 135 mg/dL — ABNORMAL HIGH (ref 70–99)
Glucose-Capillary: 140 mg/dL — ABNORMAL HIGH (ref 70–99)
Glucose-Capillary: 141 mg/dL — ABNORMAL HIGH (ref 70–99)
Glucose-Capillary: 154 mg/dL — ABNORMAL HIGH (ref 70–99)

## 2023-07-11 LAB — CULTURE, BLOOD (ROUTINE X 2)
Culture  Setup Time: NO GROWTH
Culture  Setup Time: NO GROWTH
Special Requests: ADEQUATE
Special Requests: ADEQUATE

## 2023-07-11 LAB — APTT
aPTT: 108 s — ABNORMAL HIGH (ref 24–36)
aPTT: 59 s — ABNORMAL HIGH (ref 24–36)

## 2023-07-11 LAB — MAGNESIUM: Magnesium: 2.1 mg/dL (ref 1.7–2.4)

## 2023-07-11 LAB — BRAIN NATRIURETIC PEPTIDE: B Natriuretic Peptide: 3333.6 pg/mL — ABNORMAL HIGH (ref 0.0–100.0)

## 2023-07-11 LAB — HEPARIN LEVEL (UNFRACTIONATED): Heparin Unfractionated: 0.2 [IU]/mL — ABNORMAL LOW (ref 0.30–0.70)

## 2023-07-11 MED ORDER — POTASSIUM CHLORIDE 10 MEQ/100ML IV SOLN
10.0000 meq | INTRAVENOUS | Status: AC
  Administered 2023-07-11 (×2): 10 meq via INTRAVENOUS
  Filled 2023-07-11 (×2): qty 100

## 2023-07-11 MED ORDER — FUROSEMIDE 10 MG/ML IJ SOLN
40.0000 mg | Freq: Three times a day (TID) | INTRAMUSCULAR | Status: DC
  Administered 2023-07-11 – 2023-07-13 (×6): 40 mg via INTRAVENOUS
  Filled 2023-07-11 (×6): qty 4

## 2023-07-11 MED ORDER — POTASSIUM CHLORIDE 20 MEQ PO PACK
40.0000 meq | PACK | Freq: Two times a day (BID) | ORAL | Status: AC
  Administered 2023-07-11 (×2): 40 meq via ORAL
  Filled 2023-07-11 (×2): qty 2

## 2023-07-11 MED ORDER — HEPARIN BOLUS VIA INFUSION
1300.0000 [IU] | Freq: Once | INTRAVENOUS | Status: AC
  Administered 2023-07-11: 1300 [IU] via INTRAVENOUS
  Filled 2023-07-11: qty 1300

## 2023-07-11 MED ORDER — HYDROCORTISONE SOD SUC (PF) 100 MG IJ SOLR
50.0000 mg | Freq: Two times a day (BID) | INTRAMUSCULAR | Status: DC
  Administered 2023-07-11 – 2023-07-13 (×5): 50 mg via INTRAVENOUS
  Filled 2023-07-11 (×5): qty 1

## 2023-07-11 NOTE — TOC Progression Note (Signed)
 Transition of Care [facility]) - Progression Note    Patient Details  Name: Wendy Sanchez MRN: [id number] Date of Birth: 06/26/1950  [facility] CM/SW Contact  Marinda Cooks, RN Phone Number: 07/11/2023, 4:44 PM  Clinical Narrative:    Per chart review pt is HOD#10 and not medically cleared to dc . Pt continues to be medically managed for Severe sepsis due to gram-negative UTI (HCC) Gram-negative urinary tract infection. Stage III sacral decubitus ulcer. TOC will cont to follow dc planning / care coordination and update as applicable.   Expected Discharge Plan: Home/Self Care Barriers to Discharge: Continued Medical Work up  Expected Discharge Plan and Services   Discharge Planning Services: CM Consult   Living arrangements for the past 2 months: Single Family Home                             HH Agency: [facility]         Social Determinants of Health (SDOH) Interventions SDOH Screenings   Food Insecurity: No Food Insecurity (07/03/2023)  Housing: Low Risk  (07/03/2023)  Transportation Needs: Unmet Transportation Needs (07/03/2023)  Utilities: Not At Risk (07/03/2023)  Depression (PHQ2-9): Low Risk  (07/01/2022)  Financial Resource Strain: Low Risk  (12/26/2020)   Received from [facility]  Social Connections: Unknown (07/03/2023)  Recent Concern: Social Connections - Socially Isolated (07/03/2023)  Tobacco Use: High Risk (07/01/2023)    Readmission Risk Interventions    03/08/2023   11:09 AM 02/03/2021    9:12 AM  Readmission Risk Prevention Plan  Transportation Screening Complete Complete  Medication Review (RN Care Manager) Complete Complete  PCP or Specialist appointment within 3-5 days of discharge Complete Complete  HRI or Home Care Consult  Complete  SW Recovery Care/Counseling Consult Complete   Palliative Care Screening Not Applicable Not Applicable  Skilled Nursing Facility Complete Complete

## 2023-07-11 NOTE — Progress Notes (Signed)
 PHARMACY - ANTICOAGULATION CONSULT NOTE  Pharmacy Consult for heparin  infusion Indication: h/o DVT & PE  Allergies  Allergen Reactions   Cefepime  Other (See Comments)    Encephalopathy requiring transfer to ICU on 07/05/2023 (not allergy so may use other cephalosporins and beta lactams).     Hydrocodone  Swelling   Hydrocodone -Acetaminophen  Swelling    hands   Iodinated Contrast Media Anaphylaxis   Iodine Anaphylaxis and Swelling    Iv dye  02/03/21-Ispoke to patient and she had IV contrast  X4 last year and has had no problem. ( Last CT abdomen with contrast was on 01/02/21.-   Nitrofurantoin    Other Other (See Comments)    ALLERGY TO METAL - BLACKENS SKIN AND CAUSES A RASH   Tape Swelling and Other (See Comments)    Skin comes off.  Paper tape is ok tegaderm OK   Shellfish-Derived Products    Wound Dressing Adhesive    Peanut Oil Other (See Comments)    ** Nuts cause runny nose   Shellfish Allergy Nausea And Vomiting and Swelling    Episode of GI infection after eating clam chowder. Still eats shrimp and other seafood    Patient Measurements: Height: 5' 8 (172.7 cm) Weight: 122.3 kg (269 lb 10 oz) IBW/kg (Calculated) : 63.9 Heparin  Dosing Weight: 91.1 kg   Vital Signs: Temp: 98.5 F (36.9 C) (06/29 0412) Temp Source: Axillary (06/29 0412) BP: 95/62 (06/29 0412) Pulse Rate: 79 (06/29 0412)  Labs: Recent Labs    07/09/23 0530 07/09/23 1412 07/10/23 0459 07/10/23 1452 07/10/23 2332 07/11/23 0538  HGB 8.4*  --  8.8*  --   --  8.3*  HCT 24.6*  --  24.9*  --   --  24.0*  PLT 490*  --  485*  --   --  412*  APTT 102*   < > 75* 110* 93* 108*  HEPARINUNFRC 0.29*  --  0.18* 0.33 0.27*  --   CREATININE 1.05*  --  1.13*  --   --  1.14*   < > = values in this interval not displayed.    Estimated Creatinine Clearance: 61.5 mL/min (A) (by C-G formula based on SCr of 1.14 mg/dL (H)).   Medical History: Past Medical History:  Diagnosis Date   Allergic rhinitis     Arthritis    Asthma    problems with fumes and aerosols cause asthma   Chest pain 10/19/2016   CHF (congestive heart failure) (HCC)    Complication of anesthesia    awakens during surgery; has occurred with last 3-4 surgeries    COPD (chronic obstructive pulmonary disease) (HCC)    DVT (deep venous thrombosis) (HCC) 07/13/2018   Right leg   Environmental allergies    fumes    Fibromyalgia    GERD (gastroesophageal reflux disease)    Headache    History of bronchitis    Hx of total knee arthroplasty 12/13/2015   Hyperlipidemia    Hypertension    Morbid obesity with BMI of 45.0-49.9, adult (HCC) 07/25/2015   NICM (nonischemic cardiomyopathy) (HCC)    a. ? PVC mediated;  b. 06/2016 Echo: EF 25-30%, mild LVH, mild LAE, mild AI/MR/TR/PR; c. 08/2016 Cath: nl cors, EF 25%.   Numbness    hands bilat when driving; improves when not preforming task    Osteoarthritis of left knee 08/02/2015   PE (pulmonary thromboembolism) (HCC) 07/13/2018   Pes anserinus bursitis of left knee 05/18/2016   Presence of right artificial knee  joint 05/18/2016   PVC (premature ventricular contraction) 06/04/2017   PVC's (premature ventricular contractions)    a. 11/2016 Amio started;  b. 11/29/16 48h Holter: 57574 PVC's (41%); c. 12/2016 24h Holter: 18040 PVC's (15%); c. 02/2017 48h Holter: 37262 PVC's (41%).   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   Temporal arteritis (HCC) 05/01/2020   Tinnitus    comes and goes    Unilateral primary osteoarthritis, right knee 12/13/2015   Vertigo    none for over 2 yrs    Medications:  Scheduled:   vitamin C   500 mg Per Tube BID   calcium  carbonate  1 tablet Per Tube TID WC   Chlorhexidine  Gluconate Cloth  6 each Topical Daily   copper   2 mg Per Tube Daily   feeding supplement (PROSource TF20)  60 mL Per Tube Daily   free water   30 mL Per  Tube Q4H   gabapentin   100 mg Per Tube Q8H   hydrocortisone  sod succinate (SOLU-CORTEF ) inj  100 mg Intravenous Q8H   levETIRAcetam   1,000 mg Intravenous Q12H   liver oil-zinc  oxide   Topical BID   metoprolol  succinate  12.5 mg Oral Daily   multivitamin with minerals  1 tablet Per Tube BID   nystatin  cream   Topical BID   mouth rinse  15 mL Mouth Rinse 4 times per day   pantoprazole  (PROTONIX ) IV  40 mg Intravenous QHS   QUEtiapine   25 mg Oral QHS   thiamine   100 mg Per Tube Daily   torsemide   20 mg Oral Daily   vancomycin  variable dose per unstable renal function (pharmacist dosing)   Does not apply See admin instructions   zinc  sulfate (50mg  elemental zinc )  220 mg Per Tube Daily   Infusions:   acyclovir  880 mg (07/10/23 2140)   amiodarone  30 mg/hr (07/10/23 1103)   feeding supplement (OSMOLITE 1.5 CAL) 1,000 mL (07/10/23 1629)   heparin  1,050 Units/hr (07/10/23 1625)   meropenem  (MERREM ) IV 2 g (07/10/23 2343)   promethazine  (PHENERGAN ) injection (IM or IVPB)      Assessment: 73 y.o. African-American female with medical history significant for asthma, osteoarthritis, giant cell arthritis, combined systolic and diastolic CHF, COPD, DVT, PE and atrial fibrillation on Eliquis , who presented to the emergency room with acute onset of increased weakness. Last dose of apixaban  07/05/23 0919   Goal of Therapy:  anti-Xa level 0.3-0.7 units/ml aPTT 66 - 102 seconds Monitor platelets by anticoagulation protocol: Yes  06/25 2314 aPTT 161 (verbal), supratherapeutic 06/26 1802 aPTT 96 06/27 0530 aPTT 102, borderline supratherapeutic 06/28 0459 aPTT 75, therapeutic / HL 0.18, subtherapeutic 06/28 1452 aPTT 110/HL0.33 06/28 2332 aPTT 93, therapeutic x 1 / HL 0.27 06/29 0538 aPTT 108, supratherapeutic    Plan:  ---Decrease heparin  infusion to 1000 units/hr  ---Recheck aPTT in 8 hrs after rate change (we will use aPTT to guide therapy until aPTT and anti-Xa level correlate) ---continue  to monitor H&H and platelets.   Rankin CANDIE Dills, PharmD, [facility] 07/11/2023 6:40 AM

## 2023-07-11 NOTE — Progress Notes (Addendum)
 NEUROLOGY CONSULT FOLLOW UP NOTE   Date of service: July 11, 2023 Patient Name: Wendy Sanchez MRN:  [id number] DOB:  Jul 14, 1950  Interval Hx/subjective  Continues to improve. More able to answer questions than yesterday, but still using only single words.   Vitals   Vitals:   07/11/23 0412 07/11/23 0457 07/11/23 0802 07/11/23 1208  BP: 95/62  106/73 102/72  Pulse: 79  87 94  Resp: (!) 21   (!) 22  Temp: 98.5 F (36.9 C)  98.5 F (36.9 C) 98.3 F (36.8 C)  TempSrc: Axillary  Axillary Axillary  SpO2: 100%  100% 100%  Weight:  122.3 kg    Height:         Body mass index is 41 kg/m.  Physical Exam   Constitutional: Ill-appearing, morbidly obese elderly female  Eyes: No scleral injection.  HENT: No OP obstruction.  Head: Normocephalic.  Respiratory: Effort normal, non-labored breathing.  Ext: Pitting edema to feet bilaterally > pretibially   Neurological Examination Mental Status: Awake and will turn head slightly in examiner's direction and also making brief eye contact. Moderately encephalopathic. She does respond to her name. Was able to count two fingers and say two although this was hypophonic, dysarthric and difficult to discern. She also gave 2 additional correct one-word answers to questions. She will follow commands to grip with her hands and bend at the elbows as well as wiggle her feet. Not able to follow complex commands or answer questions with > 1 word answers. Exam improved since yesterday.   Cranial Nerves: II: PERRL 5 mm >> 3 mm. Makes brief eye contact. Can count fingers.  III,IV, VI: Eyes are conjugate and at the midline. No nystagmus. Will glance to the left and right in response to stimuli from those directions.  V: Reacts to eyelid stimulation bilaterally VII: Grimaces symmetrically VIII: Responds to voice IX,X: Gag reflex deferred XI: Unable to assess XII: Unable to assess Motor/Sensory: BUE: Weak withdrawal to pinch equally bilaterally BLE:  Weak withdrawal to pinch equally bilaterally Chronic flexion contractures to ankles bilaterally.  No myoclonus or tremor noted.  Deep Tendon Reflexes: 1+ and symmetric bilateral brachioradialis. Unable to elicit patellar reflexes due to prior knee replacements Cerebellar: Unable to assess Gait: Unable to assess   Medications  Current Facility-Administered Medications:    acetaminophen  (TYLENOL ) tablet 650 mg, 650 mg, Oral, Q6H PRN, 650 mg at 07/05/23 0136 **OR** acetaminophen  (TYLENOL ) suppository 650 mg, 650 mg, Rectal, Q6H PRN, Mansy, Jan A, MD   acyclovir  (ZOVIRAX ) 880 mg in dextrose  5 % 250 mL IVPB, 10 mg/kg (Adjusted), Intravenous, Q12H, Zeigler, Dustin G, RPH, Last Rate: 267.6 mL/hr at 07/11/23 1101, 880 mg at 07/11/23 1101   albuterol  (PROVENTIL ) (2.5 MG/3ML) 0.083% nebulizer solution 2.5 mg, 2.5 mg, Nebulization, Q6H PRN, Mansy, Jan A, MD, 2.5 mg at 07/11/23 0049   [COMPLETED] amiodarone  (NEXTERONE ) 1.8 mg/mL load via infusion 150 mg, 150 mg, Intravenous, Once, 150 mg at 07/07/23 0956 **FOLLOWED BY** [EXPIRED] amiodarone  (NEXTERONE  PREMIX) 360-4.14 MG/200ML-% (1.8 mg/mL) IV infusion, 60 mg/hr, Intravenous, Continuous, Stopped at 07/07/23 1630 **FOLLOWED BY** amiodarone  (NEXTERONE  PREMIX) 360-4.14 MG/200ML-% (1.8 mg/mL) IV infusion, 30 mg/hr, Intravenous, Continuous, Gollan, Timothy J, MD, Last Rate: 16.67 mL/hr at 07/11/23 1104, 30 mg/hr at 07/11/23 1104   ascorbic acid  (VITAMIN C ) tablet 500 mg, 500 mg, Per Tube, BID, Zhang, Dekui, MD, 500 mg at 07/11/23 9177   calcium  carbonate (TUMS - dosed in mg elemental calcium ) chewable tablet 200 mg of elemental calcium ,  1 tablet, Per Tube, TID WC, Laurita Pillion, MD, 200 mg of elemental calcium  at 07/11/23 1105   Chlorhexidine  Gluconate Cloth 2 % PADS 6 each, 6 each, Topical, Daily, Dorinda Drue DASEN, MD, 6 each at 07/11/23 9175   copper  tablet 2 mg, 2 mg, Per Tube, Daily, Zhang, Dekui, MD, 2 mg at 07/11/23 9178   digoxin  (LANOXIN ) 0.25 MG/ML injection  0.25 mg, 0.25 mg, Intravenous, Q2H PRN, Cleatus Delayne GAILS, MD, 0.25 mg at 07/07/23 9352   diphenhydrAMINE  (BENADRYL ) capsule 25 mg, 25 mg, Oral, Q6H PRN, Mansy, Jan A, MD   feeding supplement (OSMOLITE 1.5 CAL) liquid 1,000 mL, 1,000 mL, Per Tube, Continuous, Laurita Pillion, MD, Last Rate: 50 mL/hr at 07/10/23 1629, 1,000 mL at 07/10/23 1629   feeding supplement (PROSource TF20) liquid 60 mL, 60 mL, Per Tube, Daily, Laurita Pillion, MD, 60 mL at 07/11/23 0824   free water  30 mL, 30 mL, Per Tube, Q4H, Laurita Pillion, MD, 30 mL at 07/11/23 1105   furosemide  (LASIX ) injection 40 mg, 40 mg, Intravenous, Q8H, Zhang, Dekui, MD, 40 mg at 07/11/23 1314   gabapentin  (NEURONTIN ) 250 MG/5ML solution 100 mg, 100 mg, Per Tube, Q8H, Raschelle Wisenbaker, MD, 100 mg at 07/11/23 1315   heparin  ADULT infusion 100 units/mL (25000 units/250mL), 1,000 Units/hr, Intravenous, Continuous, Belue, Rankin RAMAN, RPH, Last Rate: 10 mL/hr at 07/11/23 0655, 1,000 Units/hr at 07/11/23 0655   hydrocortisone  sodium succinate  (SOLU-CORTEF ) 100 MG injection 50 mg, 50 mg, Intravenous, Q12H, Laurita Pillion, MD   HYDROmorphone  (DILAUDID ) injection 0.5 mg, 0.5 mg, Intravenous, Q4H PRN, Zhang, Dekui, MD, 0.5 mg at 07/11/23 1349   levETIRAcetam  (KEPPRA ) undiluted injection 1,000 mg, 1,000 mg, Intravenous, Q12H, Djan, Prince T, MD, 1,000 mg at 07/11/23 0818   liver oil-zinc  oxide (DESITIN) 40 % ointment, , Topical, BID, Mansy, Jan A, MD, Given at 07/11/23 [phone]   liver oil-zinc  oxide (DESITIN) 40 % ointment, , Topical, PRN, Mansy, Jan A, MD   magnesium  hydroxide (MILK OF MAGNESIA) suspension 30 mL, 30 mL, Oral, Daily PRN, Mansy, Jan A, MD   melatonin tablet 5 mg, 5 mg, Oral, QHS PRN, Mansy, Jan A, MD   meropenem  (MERREM ) 2 g in sodium chloride  0.9 % 100 mL IVPB, 2 g, Intravenous, Q12H, Zeigler, Dustin G, RPH, Last Rate: 280 mL/hr at 07/11/23 1059, 2 g at 07/11/23 1059   metoprolol  succinate (TOPROL -XL) 24 hr tablet 12.5 mg, 12.5 mg, Oral, Daily, Djan, Prince T,  MD, 12.5 mg at 07/11/23 9176   metoprolol  tartrate (LOPRESSOR ) injection 5 mg, 5 mg, Intravenous, Q6H PRN, Dorinda Drue DASEN, MD, 5 mg at 07/07/23 9367   multivitamin with minerals tablet 1 tablet, 1 tablet, Per Tube, BID, Zhang, Dekui, MD, 1 tablet at 07/11/23 9176   nystatin  cream (MYCOSTATIN ), , Topical, BID, Djan, Prince T, MD, Given at 07/11/23 9175   ondansetron  (ZOFRAN ) tablet 4 mg, 4 mg, Oral, Q6H PRN **OR** ondansetron  (ZOFRAN ) injection 4 mg, 4 mg, Intravenous, Q6H PRN, Mansy, Jan A, MD, 4 mg at 07/04/23 1223   Oral care mouth rinse, 15 mL, Mouth Rinse, 4 times per day, Laurita Pillion, MD, 15 mL at 07/11/23 1105   Oral care mouth rinse, 15 mL, Mouth Rinse, PRN, Laurita Pillion, MD   oxyCODONE -acetaminophen  (PERCOCET/ROXICET) 5-325 MG per tablet 1 tablet, 1 tablet, Oral, Q6H PRN, 1 tablet at 07/05/23 1123 **AND** oxyCODONE  (Oxy IR/ROXICODONE ) immediate release tablet 5 mg, 5 mg, Oral, Q6H PRN, Mansy, Jan A, MD, 5 mg at 07/03/23 2158   pantoprazole  (  PROTONIX ) injection 40 mg, 40 mg, Intravenous, QHS, Nada Adriana BIRCH, RPH, 40 mg at 07/10/23 2207   polyethylene glycol (MIRALAX  / GLYCOLAX ) packet 17 g, 17 g, Oral, Daily PRN, Mansy, Jan A, MD   potassium chloride  (KLOR-CON ) packet 40 mEq, 40 mEq, Oral, BID, Zhang, Dekui, MD, 40 mEq at 07/11/23 9177   promethazine  (PHENERGAN ) 12.5 mg in sodium chloride  0.9 % 50 mL IVPB, 12.5 mg, Intravenous, Q6H PRN, Dorinda Drue DASEN, MD   QUEtiapine  (SEROQUEL ) tablet 25 mg, 25 mg, Oral, QHS, Zhang, Dekui, MD, 25 mg at 07/09/23 2216   thiamine  (VITAMIN B1) tablet 100 mg, 100 mg, Per Tube, Daily, Zhang, Dekui, MD, 100 mg at 07/11/23 9177   vancomycin  variable dose per unstable renal function (pharmacist dosing), , Does not apply, See admin instructions, Dail Rankin RAMAN, RPH   zinc  sulfate (50mg  elemental zinc ) capsule 220 mg, 220 mg, Per Tube, Daily, Laurita Pillion, MD, 220 mg at 07/11/23 0822  Labs and Diagnostic Imaging   CBC:  Recent Labs  Lab 07/06/23 1017  07/07/23 0318 07/09/23 0530 07/10/23 0459 07/11/23 0538  WBC 10.7* 8.3   < > 7.1 7.6  NEUTROABS 8.8* 6.7  --   --   --   HGB 8.9* 9.3*   < > 8.8* 8.3*  HCT 27.0* 27.6*   < > 24.9* 24.0*  MCV 80.1 79.3*   < > 77.3* 77.7*  PLT 460* 494*   < > 485* 412*   < > = values in this interval not displayed.    Basic Metabolic Panel:  Lab Results  Component Value Date   NA 140 07/11/2023   K 2.9 (L) 07/11/2023   CO2 25 07/11/2023   GLUCOSE 138 (H) 07/11/2023   BUN 27 (H) 07/11/2023   CREATININE 1.14 (H) 07/11/2023   CALCIUM  7.8 (L) 07/11/2023   GFRNONAA 51 (L) 07/11/2023   GFRAA >60 06/07/2019   Lipid Panel:  Lab Results  Component Value Date   LDLCALC 45 03/08/2023   HgbA1c:  Lab Results  Component Value Date   HGBA1C 4.9 03/07/2023   Urine Drug Screen: No results found for: LABOPIA, COCAINSCRNUR, LABBENZ, AMPHETMU, THCU, LABBARB  Alcohol  Level No results found for: [facility] INR  Lab Results  Component Value Date   INR 1.8 (H) 07/05/2023   APTT  Lab Results  Component Value Date   APTT 108 (H) 07/11/2023     Assessment  Wendy Sanchez is a 73 y.o. female admitted for sepsis and encephalopathy with new onset twitching, complicated by adrenal crisis and tachycardia. Her encephalopathy initially progressively worsened, but over the past 3 days has been steadily improving.   - Exam with findings that are consistent with a moderate diffuse encephalopathy. She is improved relative to yesterday, now being able to follow 3 simple commands and count fingers as well as answer 3 simple questions with one-word answers.  - Imaging: - CT head (6/19): Generalized cerebral atrophy and microvascular disease changes of the supratentorial brain. No acute intracranial abnormality. - MRI brain w/wo contrast (personally reviewed): Moderate to severely motion limited study without evidence of obvious acute abnormality. No abnormal enhancement is seen. No temporal lobe signal  abnormalities.  - Of note, she has an allergy to iodinated contrast (anaphylaxis) - EEGs: - EEG (6/20):  Intermittent slow, generalized. This study is suggestive of mild diffuse encephalopathy. No seizures or epileptiform discharges were seen throughout the recording. - EEG (6/23): Abnormalities seen consist of generalized periodic discharges with triphasic morphology (GPDs)  in addition to continuous generalized slowing. Overall, this study is suggestive of moderate diffuse encephalopathy typically related to toxic-metabolic causes like cefepime  toxicity. No seizures or epileptiform discharges were seen throughout the recording. - EEG (6/27):  Continuous slow, generalized. This study is suggestive of moderate diffuse encephalopathy. No seizures or epileptiform discharges were seen throughout the recording. - Labs: - WBC today normal at 7.6. - Recent B12 level was elevated - Recent vitamin A  level was low at 9.4 - Recent zinc  level was low at 29 - AM cortisol on 6/20 was low at 2.5.  - ID is consulting. They doubt a meningitis, but LP cannot be done to rule this out.  - Impression: - Most likely underlying etiologies for her worsened encephalopathy include meningitis, septic encephalopathy and residual cefepime -neurotoxicity. Cefepime  was stopped on 6/23. There may be a component of hospital delirium as well.  - Initial spell in the ED was most likely syncope secondary to hypotension. She was awake and fully conversant on admission, deteriorating during her stay while being treated for infection.  - Her diffuse limb jerking with stimulation is now resolved. This may have been secondary to Neurontin  toxicity. However, case reports do document rare occurrence of Neurontin -withdrawal encephalopathy. Therefore, Neurontin  has been restarted at the lower dose of 100 mg TID.  - Seizure with postictal state is also a possible etiology for her encephalopathy, but this is difficult to differentiate from  infection (aka septic encephalopathy) or cefepime  neurotoxicity as the etiology for her encephalopathy. Her infection could certainly lower her seizure threshold, but seizure is now lower on the DDx.  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  - Prior history of giant cell arteritis. No stroke on MRI this admission.  - Other active Problems: COPD, lower urinary tract infection, hypotension, peripheral neuropathy, paroxysmal atrial fibrillation, reactive thrombocytosis, adrenal crisis, chronically bedbound with chronic ankle contractures   Recommendations  - Continue Keppra  at 1000 mg BID - Continue Neurontin  at the lower dose of 100 mg TID  - Continuing meropenem , vancomycin  and acyclovir  per ID recommendations.  - Unable to obtain LP due to anticoagulation - Vitamin and mineral supplementation per Dietician consultant - Neurohospitalist service will follow PRN. Please call if she does not continue to improve.  ______________________________________________________________________   Bonney SHARK, Julee Stoll, MD [facility]

## 2023-07-11 NOTE — Progress Notes (Addendum)
 Progress Note   Patient: Wendy Sanchez FMW:[id number] DOB: 1950-10-02 DOA: 07/01/2023     10 DOS: the patient was seen and examined on 07/11/2023   Brief hospital course: From HPI Wendy Sanchez is a 73 y.o. African-American female with medical history significant for asthma, osteoarthritis, giant cell arthritis, combined systolic and diastolic CHF, COPD, DVT, PE and atrial fibrillation on Eliquis , who presented to the emergency room with acute onset of increased weakness.  Patient was hypotensive upon arriving to hospital, she also had a syncope due to hypotension. Patient also has tachycardia, tachypnea and leukocytosis.  She also has severe altered mental status, EEG did not show any evidence of seizure.  MRI of the brain poor quality, but did not show any acute changes.  LP was performed, could not collect any CSF. Her urine culture grow E. coli Serratia and Pseudomonas.  Patient was placed on broad-spectrum antibiotics.  Patient mental status improved in the morning of 6/28 after restarting Neurontin .  But the patient developed significant volume overload, started on IV Lasix .   Principal Problem:   Sepsis due to gram-negative UTI (HCC) Active Problems:   Lower urinary tract infection   Syncope   Hypotension   Chronic obstructive pulmonary disease (COPD) (HCC)   Peripheral neuropathy   Acute on chronic combined systolic and diastolic CHF (congestive heart failure) (HCC)   Paroxysmal atrial fibrillation (HCC)   GERD without esophagitis   Asthma-COPD overlap syndrome (HCC)   Reactive thrombocytosis   Adrenal crisis (HCC)   Weakness   Encephalopathy   Acute metabolic encephalopathy   Decreased cardiac ejection fraction   Paroxysmal SVT (supraventricular tachycardia) (HCC)   Assessment and Plan:  Severe sepsis due to gram-negative UTI (HCC) Gram-negative urinary tract infection. Stage III sacral decubitus ulcer - Sepsis manifested by leukocytosis, tachycardia and  tachypnea.  Patient also had a severe lactic acidosis of 4.5. Patient received sepsis fluid in the emergency room Sepsis also compounded by decubitus sacral ulcer Wound care on board; we appreciate input Culture results showing Pseudomonas in the wound, urine cultures growing E. coli, Serratia as well as Pseudomonas Patient is seen by infectious disease, currently treated with meropenem  and vancomycin .   Continue current treatment.  Will discuss with ID tomorrow to switch to oral antibiotics.   Acute metabolic encephalopathy rule out possible bacterial meningitis or encephalitis Noted to have some tremors involving the right face and legs as well as bilateral upper extremities EEG obtained that did not show any epileptiform waves Patient initiated on Keppra  and neurology was consulted LP was attempted by IR today however unsuccessful Currently patient on vancomycin , meropenem  and acyclovir  According to ID pharmacist meropenem  will also cover Listeria (no need for ampicillin ) MRI of the brain with and without contrast showed a moderate motion artifact, but no apparent acute changes. Patient has been followed by neurology on a daily basis.  Discussed with Dr. Lindzen, this is unlikely due to cefepime  toxicity.  More likely due to metabolic encephalopathy from infection.  However, we will continue cover for meningitis. Repeated EEG 6/26 still show significant encephalopathy, no seizure activity. Dobbhoff was placed 6/26, tube feeding started. 6/28.  Patient was restarted on Neurontin  today, mental status suddenly improved.  Looks like encephalopathy is caused by Neurontin  withdrawal.  Meningitis at this moment seems unlikely.  If patient continue to improve, may consider scale back antibiotics. Patient mental status continues to improve.  But patient has significant weakness, PT/OT obtain   Acute on chronic combined systolic and  diastolic congestive heart failure. Patient had echocardiogram  performed on 6/20, ejection fraction 45 to 50% with grade 1 diastolic dysfunction. Patient was given 60 mg IV Lasix  6/28.  Repeat BNP over 3000, chest x-ray showed vascular congestion with evidence of pulm edema.  Patient also has anasarca.  Discussed with cardiology, diuretics switched to furosemide  40 mg every 8 hours.  Monitor renal function closely.   Anemia of chronic disease. Reactive thrombocytopenia. Recent lab test showed slight decrease iron with elevated ferritin, elevated B12 level.  Condition consistent with anemia of chronic disease.   Hemoglobin relatively stable.  Continue to follow.   Syncope likely secondary to hypotension Adrenal crisis. Hypoglycemia secondary to adrenal crisis Continue neurochecks closely Echo showed EF 45 to 50% with grade 1 diastolic dysfunction Patient not fluid overloaded Patient had cortisol level of 2.5, she also had significant hypotension, hypoglycemia.  She has adrenal crisis.  She was chronically taking lower dose of Cortef , started stress dose hydrocortisone  at 100 mg every 8 hours 6/25. Patient condition started improving, reduce hydrocortisone  to 50 mg every 12 hours.   Hypokalemia Hyponatremia. Sodium and potassium level normalized.  Slightly worsened renal function, give additional potassium dose    Chronic obstructive pulmonary disease (COPD) (HCC) ABG did not show any evidence of CO2 retention. Continue her as needed DuoNebs.   Peripheral neuropathy Restarted Neurontin .   GERD without esophagitis Continue PPI therapy   Paroxysmal atrial fibrillation (HCC) with RVR. Patient is followed by cardiology, heart rate more stable.  Still on heparin  drip, amiodarone .  Beta-blocker.  Heart rate better controlled. May consider changing to oral Eliquis  tomorrow as patient mental status improved.   Class II obesity with BMI 37.78. Complicated patient care as well as prognosis    Pressure ulcer POA  Stage 2 pressure ulcers bilateral  ischial tuberosity           Subjective:  Patient is awake today, still has some confusion. She has significant short of breath with min exertion.  Physical Exam: Vitals:   07/11/23 0400 07/11/23 0412 07/11/23 0457 07/11/23 0802  BP:  95/62  106/73  Pulse:  79  87  Resp: 16 (!) 21    Temp:  98.5 F (36.9 C)  98.5 F (36.9 C)  TempSrc:  Axillary  Axillary  SpO2:  100%  100%  Weight:   122.3 kg   Height:       General exam: Appears calm and comfortable  Respiratory system: Significant decreased breath sounds. Respiratory effort normal. Cardiovascular system: S1 & S2 heard, RRR. No JVD, murmurs, rubs, gallops or clicks.  Anasarca. Gastrointestinal system: Abdomen is nondistended, soft and nontender. No organomegaly or masses felt. Normal bowel sounds heard. Central nervous system: Alert and oriented x2. No focal neurological deficits. Extremities: 2+ leg edema Skin: No rashes, lesions or ulcers Psychiatry: Mood & affect appropriate.    Data Reviewed:  Lab results reviewed.  Family Communication: sister updated at bedside  Disposition: Status is: Inpatient Remains inpatient appropriate because: Severity of disease.  IV treatment.     Time spent: 55 minutes  Author: Murvin Mana, MD 07/11/2023 11:55 AM  For on call review [email or web address].

## 2023-07-11 NOTE — Progress Notes (Signed)
 PHARMACY - ANTICOAGULATION CONSULT NOTE  Pharmacy Consult for heparin  infusion Indication: h/o DVT & PE  Allergies  Allergen Reactions   Cefepime  Other (See Comments)    Encephalopathy requiring transfer to ICU on 07/05/2023 (not allergy so may use other cephalosporins and beta lactams).     Hydrocodone  Swelling   Hydrocodone -Acetaminophen  Swelling    hands   Iodinated Contrast Media Anaphylaxis   Iodine Anaphylaxis and Swelling    Iv dye  02/03/21-Ispoke to patient and she had IV contrast  X4 last year and has had no problem. ( Last CT abdomen with contrast was on 01/02/21.-   Nitrofurantoin    Other Other (See Comments)    ALLERGY TO METAL - BLACKENS SKIN AND CAUSES A RASH   Tape Swelling and Other (See Comments)    Skin comes off.  Paper tape is ok tegaderm OK   Shellfish-Derived Products    Wound Dressing Adhesive    Peanut Oil Other (See Comments)    ** Nuts cause runny nose   Shellfish Allergy Nausea And Vomiting and Swelling    Episode of GI infection after eating clam chowder. Still eats shrimp and other seafood    Patient Measurements: Height: 5' 8 (172.7 cm) Weight: 124 kg (273 lb 5.9 oz) IBW/kg (Calculated) : 63.9 Heparin  Dosing Weight: 91.1 kg   Vital Signs: Temp: 98.4 F (36.9 C) (06/29 0000) Temp Source: Axillary (06/29 0000) BP: 113/80 (06/29 0000) Pulse Rate: 98 (06/29 0000)  Labs: Recent Labs    07/08/23 0437 07/08/23 1022 07/09/23 0530 07/09/23 1412 07/10/23 0459 07/10/23 1452 07/10/23 2332  HGB  --   --  8.4*  --  8.8*  --   --   HCT  --   --  24.6*  --  24.9*  --   --   PLT  --   --  490*  --  485*  --   --   APTT  --    < > 102*   < > 75* 110* 93*  HEPARINUNFRC  --    < > 0.29*  --  0.18* 0.33 0.27*  CREATININE 0.92  --  1.05*  --  1.13*  --   --    < > = values in this interval not displayed.    Estimated Creatinine Clearance: 62.4 mL/min (A) (by C-G formula based on SCr of 1.13 mg/dL (H)).   Medical History: Past Medical History:   Diagnosis Date   Allergic rhinitis    Arthritis    Asthma    problems with fumes and aerosols cause asthma   Chest pain 10/19/2016   CHF (congestive heart failure) (HCC)    Complication of anesthesia    awakens during surgery; has occurred with last 3-4 surgeries    COPD (chronic obstructive pulmonary disease) (HCC)    DVT (deep venous thrombosis) (HCC) 07/13/2018   Right leg   Environmental allergies    fumes    Fibromyalgia    GERD (gastroesophageal reflux disease)    Headache    History of bronchitis    Hx of total knee arthroplasty 12/13/2015   Hyperlipidemia    Hypertension    Morbid obesity with BMI of 45.0-49.9, adult (HCC) 07/25/2015   NICM (nonischemic cardiomyopathy) (HCC)    a. ? PVC mediated;  b. 06/2016 Echo: EF 25-30%, mild LVH, mild LAE, mild AI/MR/TR/PR; c. 08/2016 Cath: nl cors, EF 25%.   Numbness    hands bilat when driving; improves when not preforming task  Osteoarthritis of left knee 08/02/2015   PE (pulmonary thromboembolism) (HCC) 07/13/2018   Pes anserinus bursitis of left knee 05/18/2016   Presence of right artificial knee joint 05/18/2016   PVC (premature ventricular contraction) 06/04/2017   PVC's (premature ventricular contractions)    a. 11/2016 Amio started;  b. 11/29/16 48h Holter: 57574 PVC's (41%); c. 12/2016 24h Holter: 18040 PVC's (15%); c. 02/2017 48h Holter: 37262 PVC's (41%).   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   Temporal arteritis (HCC) 05/01/2020   Tinnitus    comes and goes    Unilateral primary osteoarthritis, right knee 12/13/2015   Vertigo    none for over 2 yrs    Medications:  Scheduled:   vitamin C   500 mg Per Tube BID   calcium  carbonate  1 tablet Per Tube TID WC   Chlorhexidine  Gluconate Cloth  6 each Topical Daily   copper   2 mg Per Tube Daily   feeding supplement (PROSource TF20)  60  mL Per Tube Daily   free water   30 mL Per Tube Q4H   gabapentin   100 mg Per Tube Q8H   hydrocortisone  sod succinate (SOLU-CORTEF ) inj  100 mg Intravenous Q8H   levETIRAcetam   1,000 mg Intravenous Q12H   liver oil-zinc  oxide   Topical BID   metoprolol  succinate  12.5 mg Oral Daily   multivitamin with minerals  1 tablet Per Tube BID   nystatin  cream   Topical BID   mouth rinse  15 mL Mouth Rinse 4 times per day   pantoprazole  (PROTONIX ) IV  40 mg Intravenous QHS   QUEtiapine   25 mg Oral QHS   thiamine   100 mg Per Tube Daily   torsemide   20 mg Oral Daily   vancomycin  variable dose per unstable renal function (pharmacist dosing)   Does not apply See admin instructions   zinc  sulfate (50mg  elemental zinc )  220 mg Per Tube Daily   Infusions:   acyclovir  880 mg (07/10/23 2140)   amiodarone  30 mg/hr (07/10/23 1103)   feeding supplement (OSMOLITE 1.5 CAL) 1,000 mL (07/10/23 1629)   heparin  1,050 Units/hr (07/10/23 1625)   meropenem  (MERREM ) IV 2 g (07/10/23 2343)   promethazine  (PHENERGAN ) injection (IM or IVPB)      Assessment: 73 y.o. African-American female with medical history significant for asthma, osteoarthritis, giant cell arthritis, combined systolic and diastolic CHF, COPD, DVT, PE and atrial fibrillation on Eliquis , who presented to the emergency room with acute onset of increased weakness. Last dose of apixaban  07/05/23 0919   Goal of Therapy:  anti-Xa level 0.3-0.7 units/ml aPTT 66 - 102 seconds Monitor platelets by anticoagulation protocol: Yes  06/25 2314 aPTT 161 (verbal), supratherapeutic 06/26 1802 aPTT 96 06/27 0530 aPTT 102, borderline supratherapeutic 06/28 0459 aPTT 75, therapeutic / HL 0.18, subtherapeutic 06/28 1452 aPTT 110/HL0.33 06/28 2332 aPTT 93, therapeutic x 1 / HL 0.27    Plan:  ---aPTT is therapeutic x 1 and not correlating with HL ---Continue heparin  infusion to 1050 units/hr  ---recheck aPTT in 8 hrs to confirm (we will use aPTT to guide therapy  until aPTT and anti-Xa level correlate) ---continue to monitor H&H and platelets.   Rankin CANDIE Dills, PharmD, MBA 07/11/2023 12:20 AM

## 2023-07-11 NOTE — Progress Notes (Signed)
 PHARMACY - ANTICOAGULATION CONSULT NOTE  Pharmacy Consult for heparin  infusion Indication: h/o DVT & PE  Allergies  Allergen Reactions   Cefepime  Other (See Comments)    Encephalopathy requiring transfer to ICU on 07/05/2023 (not allergy so may use other cephalosporins and beta lactams).     Hydrocodone  Swelling   Hydrocodone -Acetaminophen  Swelling    hands   Iodinated Contrast Media Anaphylaxis   Iodine Anaphylaxis and Swelling    Iv dye  02/03/21-Ispoke to patient and she had IV contrast  X4 last year and has had no problem. ( Last CT abdomen with contrast was on 01/02/21.-   Nitrofurantoin    Other Other (See Comments)    ALLERGY TO METAL - BLACKENS SKIN AND CAUSES A RASH   Tape Swelling and Other (See Comments)    Skin comes off.  Paper tape is ok tegaderm OK   Shellfish-Derived Products    Wound Dressing Adhesive    Peanut Oil Other (See Comments)    ** Nuts cause runny nose   Shellfish Allergy Nausea And Vomiting and Swelling    Episode of GI infection after eating clam chowder. Still eats shrimp and other seafood    Patient Measurements: Height: 5' 8 (172.7 cm) Weight: 122.3 kg (269 lb 10 oz) IBW/kg (Calculated) : 63.9 Heparin  Dosing Weight: 91.1 kg   Vital Signs: Temp: 98.2 F (36.8 C) (06/29 1552) Temp Source: Axillary (06/29 1208) BP: 104/67 (06/29 1552) Pulse Rate: 82 (06/29 1552)  Labs: Recent Labs    07/09/23 0530 07/09/23 1412 07/10/23 0459 07/10/23 1452 07/10/23 2332 07/11/23 0538 07/11/23 1452  HGB 8.4*  --  8.8*  --   --  8.3*  --   HCT 24.6*  --  24.9*  --   --  24.0*  --   PLT 490*  --  485*  --   --  412*  --   APTT 102*   < > 75* 110* 93* 108* 59*  HEPARINUNFRC 0.29*  --  0.18* 0.33 0.27*  --  0.20*  CREATININE 1.05*  --  1.13*  --   --  1.14*  --    < > = values in this interval not displayed.    Estimated Creatinine Clearance: 61.5 mL/min (A) (by C-G formula based on SCr of 1.14 mg/dL (H)).   Medical History: Past Medical  History:  Diagnosis Date   Allergic rhinitis    Arthritis    Asthma    problems with fumes and aerosols cause asthma   Chest pain 10/19/2016   CHF (congestive heart failure) (HCC)    Complication of anesthesia    awakens during surgery; has occurred with last 3-4 surgeries    COPD (chronic obstructive pulmonary disease) (HCC)    DVT (deep venous thrombosis) (HCC) 07/13/2018   Right leg   Environmental allergies    fumes    Fibromyalgia    GERD (gastroesophageal reflux disease)    Headache    History of bronchitis    Hx of total knee arthroplasty 12/13/2015   Hyperlipidemia    Hypertension    Morbid obesity with BMI of 45.0-49.9, adult (HCC) 07/25/2015   NICM (nonischemic cardiomyopathy) (HCC)    a. ? PVC mediated;  b. 06/2016 Echo: EF 25-30%, mild LVH, mild LAE, mild AI/MR/TR/PR; c. 08/2016 Cath: nl cors, EF 25%.   Numbness    hands bilat when driving; improves when not preforming task    Osteoarthritis of left knee 08/02/2015   PE (pulmonary thromboembolism) (HCC) 07/13/2018  Pes anserinus bursitis of left knee 05/18/2016   Presence of right artificial knee joint 05/18/2016   PVC (premature ventricular contraction) 06/04/2017   PVC's (premature ventricular contractions)    a. 11/2016 Amio started;  b. 11/29/16 48h Holter: 57574 PVC's (41%); c. 12/2016 24h Holter: 18040 PVC's (15%); c. 02/2017 48h Holter: 37262 PVC's (41%).   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   Temporal arteritis (HCC) 05/01/2020   Tinnitus    comes and goes    Unilateral primary osteoarthritis, right knee 12/13/2015   Vertigo    none for over 2 yrs    Medications:  Scheduled:   vitamin C   500 mg Per Tube BID   calcium  carbonate  1 tablet Per Tube TID WC   Chlorhexidine  Gluconate Cloth  6 each Topical Daily   copper   2 mg Per Tube Daily   feeding supplement (PROSource  TF20)  60 mL Per Tube Daily   free water   30 mL Per Tube Q4H   furosemide   40 mg Intravenous Q8H   gabapentin   100 mg Per Tube Q8H   heparin   1,300 Units Intravenous Once   hydrocortisone  sod succinate (SOLU-CORTEF ) inj  50 mg Intravenous Q12H   levETIRAcetam   1,000 mg Intravenous Q12H   liver oil-zinc  oxide   Topical BID   metoprolol  succinate  12.5 mg Oral Daily   multivitamin with minerals  1 tablet Per Tube BID   nystatin  cream   Topical BID   mouth rinse  15 mL Mouth Rinse 4 times per day   pantoprazole  (PROTONIX ) IV  40 mg Intravenous QHS   potassium chloride   40 mEq Oral BID   QUEtiapine   25 mg Oral QHS   thiamine   100 mg Per Tube Daily   vancomycin  variable dose per unstable renal function (pharmacist dosing)   Does not apply See admin instructions   zinc  sulfate (50mg  elemental zinc )  220 mg Per Tube Daily   Infusions:   acyclovir  880 mg (07/11/23 1101)   amiodarone  30 mg/hr (07/11/23 1104)   feeding supplement (OSMOLITE 1.5 CAL) 1,000 mL (07/10/23 1629)   heparin  1,000 Units/hr (07/11/23 1623)   meropenem  (MERREM ) IV 2 g (07/11/23 1059)   promethazine  (PHENERGAN ) injection (IM or IVPB)      Assessment: 73 y.o. African-American female with medical history significant for asthma, osteoarthritis, giant cell arthritis, combined systolic and diastolic CHF, COPD, DVT, PE and atrial fibrillation on Eliquis , who presented to the emergency room with acute onset of increased weakness. Last dose of apixaban  07/05/23 0919   Goal of Therapy:  anti-Xa level 0.3-0.7 units/ml aPTT 66 - 102 seconds Monitor platelets by anticoagulation protocol: Yes  06/25 2314 aPTT 161 (verbal), supratherapeutic 06/26 1802 aPTT 96 06/27 0530 aPTT 102, borderline supratherapeutic 06/28 0459 aPTT 75, therapeutic / HL 0.18, subtherapeutic 06/28 1452 aPTT 110/HL0.33 06/28 2332 aPTT 93, therapeutic x 1 / HL 0.27 06/29 0538 aPTT 108, supratherapeutic 06/29 1452 aPTT 59/HL 0.20    Plan:  ---aPTT and  HL are both SUBtherapeutic and correlating ---Heparin  1300 units IV bolus  ---Increase heparin  infusion to 1150 units/hr  ---Recheck HL in 8 hrs after rate change  ---continue to monitor H&H and platelets.   Olam Fritter, PharmD, BCPS 07/11/2023 4:39 PM

## 2023-07-11 NOTE — Progress Notes (Signed)
 Rounding Note   Patient Name: Wendy Sanchez Date of Encounter: 07/11/2023  [facility] Cardiologist: Evalene Lunger, MD   Subjective No acute overnight cardiac events. Given IV Lasiz 60 mg and titration of torsemide  yesterday with increased WOB. Will not respond to questions outside of opening her eyes. Remains on amiodarone  gtt (AMS).  Scheduled Meds:  vitamin C   500 mg Per Tube BID   calcium  carbonate  1 tablet Per Tube TID WC   Chlorhexidine  Gluconate Cloth  6 each Topical Daily   copper   2 mg Per Tube Daily   feeding supplement (PROSource TF20)  60 mL Per Tube Daily   free water   30 mL Per Tube Q4H   gabapentin   100 mg Per Tube Q8H   hydrocortisone  sod succinate (SOLU-CORTEF ) inj  100 mg Intravenous Q8H   levETIRAcetam   1,000 mg Intravenous Q12H   liver oil-zinc  oxide   Topical BID   metoprolol  succinate  12.5 mg Oral Daily   multivitamin with minerals  1 tablet Per Tube BID   nystatin  cream   Topical BID   mouth rinse  15 mL Mouth Rinse 4 times per day   pantoprazole  (PROTONIX ) IV  40 mg Intravenous QHS   potassium chloride   40 mEq Oral BID   QUEtiapine   25 mg Oral QHS   thiamine   100 mg Per Tube Daily   torsemide   20 mg Oral Daily   vancomycin  variable dose per unstable renal function (pharmacist dosing)   Does not apply See admin instructions   zinc  sulfate (50mg  elemental zinc )  220 mg Per Tube Daily   Continuous Infusions:  acyclovir  880 mg (07/10/23 2140)   amiodarone  30 mg/hr (07/10/23 1103)   feeding supplement (OSMOLITE 1.5 CAL) 1,000 mL (07/10/23 1629)   heparin  1,000 Units/hr (07/11/23 0655)   meropenem  (MERREM ) IV 2 g (07/10/23 2343)   potassium chloride  10 mEq (07/11/23 0817)   promethazine  (PHENERGAN ) injection (IM or IVPB)     PRN Meds: acetaminophen  **OR** acetaminophen , albuterol , digoxin , diphenhydrAMINE , HYDROmorphone  (DILAUDID ) injection, liver oil-zinc  oxide, magnesium  hydroxide, melatonin, metoprolol  tartrate, ondansetron  **OR**  ondansetron  (ZOFRAN ) IV, mouth rinse, oxyCODONE -acetaminophen  **AND** oxyCODONE , polyethylene glycol, promethazine  (PHENERGAN ) injection (IM or IVPB)   Vital Signs  Vitals:   07/11/23 0400 07/11/23 0412 07/11/23 0457 07/11/23 0802  BP:  95/62  106/73  Pulse:  79  87  Resp: 16 (!) 21    Temp:  98.5 F (36.9 C)  98.5 F (36.9 C)  TempSrc:  Axillary  Axillary  SpO2:  100%  100%  Weight:   122.3 kg   Height:        Intake/Output Summary (Last 24 hours) at 07/11/2023 0837 Last data filed at 07/11/2023 0500 Gross per 24 hour  Intake 40 ml  Output 3690 ml  Net -3650 ml      07/11/2023    4:57 AM 07/10/2023    5:00 AM 07/09/2023    3:40 AM  Last 3 Weights  Weight (lbs) 269 lb 10 oz 273 lb 5.9 oz 259 lb 11.2 oz  Weight (kg) 122.3 kg 124 kg 117.8 kg      Telemetry Normal sinus rhythm with PACs and PVCs and a brief run of SVT/atrial tachycardia with possible aberrancy vs NSVT - Personally Reviewed  ECG  No new tracings - Personally Reviewed  Physical Exam  GEN: No acute distress; more alert today with eyes open Neck: No JVD Cardiac: RRR, no murmurs, rubs, or gallops.  Respiratory: Diminished and coarse  breath sounds bilaterally GI: Soft, non-distended  MS: Mild bilateral lower extremity edema; No deformity. Unable to purposely move her extremities Neuro: Full exam not performed Psych: Alert; otherwise will not respond to questions/commands   Labs High Sensitivity Troponin:   Recent Labs  Lab 07/01/23 1936  TROPONINIHS 30*     Chemistry Recent Labs  Lab 07/05/23 2336 07/06/23 1017 07/07/23 0318 07/08/23 0437 07/09/23 0530 07/10/23 0459 07/11/23 0538  NA 136 138 135   < > 136 136 140  K 3.1* 2.8* 3.2*   < > 3.6 3.4* 2.9*  CL 105 107 106   < > 105 105 107  CO2 24 25 21*   < > 25 22 25   GLUCOSE 90 75 70   < > 139* 126* 138*  BUN 22 19 16    < > 20 23 27*  CREATININE 0.88 0.83 0.70   < > 1.05* 1.13* 1.14*  CALCIUM  7.4* 7.4* 7.6*   < > 7.7* 7.8* 7.8*  MG  --   --   1.7   < > 2.0 2.2 2.1  PROT 4.3* 4.2*  --   --   --   --   --   ALBUMIN  <1.5* <1.5* <1.5*  --   --   --   --   AST 24 23  --   --   --   --   --   ALT 16 14  --   --   --   --   --   ALKPHOS 123 122  --   --   --   --   --   BILITOT 0.3 0.6  --   --   --   --   --   GFRNONAA >60 >60 >60   < > 56* 52* 51*  ANIONGAP 7 6 8    < > 6 9 8    < > = values in this interval not displayed.    Lipids No results for input(s): CHOL, TRIG, HDL, LABVLDL, LDLCALC, CHOLHDL in the last 168 hours.  Hematology Recent Labs  Lab 07/09/23 0530 07/10/23 0459 07/11/23 0538  WBC 6.1 7.1 7.6  RBC 3.20* 3.22* 3.09*  HGB 8.4* 8.8* 8.3*  HCT 24.6* 24.9* 24.0*  MCV 76.9* 77.3* 77.7*  MCH 26.3 27.3 26.9  MCHC 34.1 35.3 34.6  RDW 17.6* 17.9* 18.6*  PLT 490* 485* 412*   Thyroid  No results for input(s): TSH, FREET4 in the last 168 hours.  BNPNo results for input(s): BNP, PROBNP in the last 168 hours.  DDimer No results for input(s): DDIMER in the last 168 hours.   Radiology  EEG adult Result Date: 07/09/2023 IMPRESSION: This study is suggestive of moderate diffuse encephalopathy. No seizures or epileptiform discharges were seen throughout the recording. Arlin MALVA Krebs   DG Abd Portable 1V Result Date: 07/08/2023 IMPRESSION: Tip of the weighted enteric tube just to the right of midline in the region of the distal stomach or proximal duodenum. Electronically Signed   By: Andrea Gasman M.D.   On: 07/08/2023 12:23    Cardiac Studies  2D echo 07/02/2023: 1. Left ventricular ejection fraction, by estimation, is 45 to 50%. The  left ventricle has mildly decreased function. The left ventricle  demonstrates global hypokinesis. The left ventricular internal cavity size  was mildly dilated. There is mild  concentric left ventricular hypertrophy. Left ventricular diastolic  parameters are consistent with Grade I diastolic dysfunction (impaired  relaxation).   2. Right ventricular systolic  function is normal. The right ventricular  size is normal.   3. A small pericardial effusion is present. The pericardial effusion is  posterior to the left ventricle.   4. The mitral valve is grossly normal. Trivial mitral valve  regurgitation.   5. The aortic valve is grossly normal. Aortic valve regurgitation is not  visualized.   Patient Profile   Ms. 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 difficile, presenting with weakness, UTI, worsening sacral wound, syncope in the ER Several episodes of narrow complex tachycardia lasting less than 1 hour consistent with SVT, previously treated with amiodarone   Assessment & Plan  Paroxysmal SVT -Known history of SVT, previously placed on amiodarone  at hospital discharge March 2025. She stopped this as an outpatient as prescription ran out -Multiple episodes of SVT up to 45 minutes to 1 hour on several occasions this admission; overall burden much improved -Unable to tolerate pills at this time given encephalopathy -July 07, 2023 started with amiodarone  bolus and infusion, continue amiodarone  gtt at this time -Transition to oral amiodarone  once mental status has improved -Appears to be tolerating well -Replete potassium as below  HFmrEF: -Receiving metoprolol  and torsemide   -Escalate as able   Encephalopathy -Starting June 23 in the setting of UTI, sacral decub ulcer, septic picture, urology following -EEG with moderate diffuse encephalopathy, MRI brain completed with motion artifact   Sepsis due to UTI, stage III sacral decubitus ulcer -Presenting with tachycardia, tachypnea, leukocytosis, lactic acidosis on broad-spectrum antibiotics   History of DVT/PE -High risk  for recurrent event, on Eliquis  as outpatient, unable to tolerate pills -On heparin  infusion  Hypokalemia -Replete to goal 4.0 -Magnesium  at goal   For questions or updates, please contact [facility] Please consult [email or web address] for contact info under     Signed, Bernardino Bring, PA-C  07/11/2023, 8:37 AM

## 2023-07-11 NOTE — Evaluation (Signed)
 Physical Therapy Evaluation Patient Details Name: MARLIS OLDAKER MRN: [id number] DOB: October 02, 1950 Today's Date: 07/11/2023  History of Present Illness  Pt is a 73 y.o. female with medical history significant for asthma, osteoarthritis, giant cell arthritis, combined systolic and diastolic CHF, COPD, DVT, PE and atrial fibrillation on Eliquis , who presented to the emergency room with acute onset of increased weakness. MD assessment includes severe sepsis due to gram-negative UTI, acute metabolic encephalopathy, hypokalemia, and peripheral neuropathy.  Clinical Impression  Pt was lethargic upon PT entering the room, but was able participate during the session with encouragement from PT and put forth good effort throughout. Pt required +3 max assist for all bed mobility tasks, per below. Pt was unable to maintain sitting balance at EOB without physical assistance. Pt made effortful attempts at seated therex (described below). Pt's sister, who was present during the session and who cares for pt 24/7, stated that pt is not currently at baseline function. Pt reported no adverse symptoms during the session with SpO2 and HR WNL throughout on 2L/min. Pt will benefit from continued PT services upon discharge to safely address deficits listed in patient problem list for decreased caregiver assistance and eventual return to PLOF.        If plan is discharge home, recommend the following: Two people to help with walking and/or transfers;Two people to help with bathing/dressing/bathroom;Assistance with cooking/housework;Assistance with feeding;Direct supervision/assist for medications management;Direct supervision/assist for financial management;Help with stairs or ramp for entrance;Assist for transportation   Can travel by private vehicle   No    Equipment Recommendations Other (comment) (TBD at next venue of care)  Recommendations for Other Services       Functional Status Assessment Patient has had a  recent decline in their functional status and demonstrates the ability to make significant improvements in function in a reasonable and predictable amount of time.     Precautions / Restrictions Precautions Precautions: Fall Recall of Precautions/Restrictions: Impaired Restrictions Weight Bearing Restrictions Per Provider Order: No      Mobility  Bed Mobility Overal bed mobility: Needs Assistance Bed Mobility: Supine to Sit, Sit to Supine     Supine to sit: Max assist, +3 for physical assistance Sit to supine: Max assist, +3 for physical assistance   General bed mobility comments: Pt required +3 max assist for all bed mobility tasks to assist with moving legs on/off the EOB and for trunk control, which pt demonstrated severe posterior lean    Transfers                   General transfer comment: Not assessed; pt only able to sit EOB    Ambulation/Gait                  Stairs            Wheelchair Mobility     Tilt Bed    Modified Rankin (Stroke Patients Only)       Balance Overall balance assessment: Needs assistance Sitting-balance support: Feet supported, No upper extremity supported Sitting balance-Leahy Scale: Poor Sitting balance - Comments: Pt demonstrated severe posterior lean in sitting EOB, but grossly improved as session progressed. Pt was able to maintain sitting balance without physical assistance for around 15 seconds, as physical assistance ceased, pt began to slowly lean posteriorly Postural control: Posterior lean  Pertinent Vitals/Pain Pain Assessment Pain Assessment: Faces Faces Pain Scale: Hurts a little bit Pain Location: R shoulder Pain Descriptors / Indicators: Constant, Grimacing Pain Intervention(s): Monitored during session    Home Living Family/patient expects to be discharged to:: Private residence Living Arrangements: Alone Available Help at Discharge:  Family;Available 24 hours/day Type of Home: House Home Access: Ramped entrance       Home Layout: One level Home Equipment: BSC/3in1;Hospital bed;Rollator (4 wheels); lift chair      Prior Function Prior Level of Function : Needs assist       Physical Assist : Mobility (physical);ADLs (physical) Mobility (physical): Bed mobility;Transfers;Gait ADLs (physical): Feeding;Grooming;Bathing;Dressing;Toileting Mobility Comments: Pt's sister stated that within the last 6 months pt had been able to perform SPTs to [facility] with +2 assist but over the last several months pt has declined to where she has only been able to stand statically for a max of 45-50 sec 2x/week ADLs Comments: Pt's sister stated that she assists pt with all basic ADLs     Extremity/Trunk Assessment   Upper Extremity Assessment Upper Extremity Assessment: Defer to OT evaluation    Lower Extremity Assessment Lower Extremity Assessment: Generalized weakness       Communication   Communication Communication: No apparent difficulties    Cognition Arousal: Lethargic Behavior During Therapy: WFL for tasks assessed/performed   PT - Cognitive impairments: Difficult to assess Difficult to assess due to: Level of arousal                       Following commands: Intact       Cueing Cueing Techniques: Verbal cues, Gestural cues, Tactile cues     General Comments      Exercises Other Exercises Other Exercises: LAQ at EOB x 10 BLE Other Exercises: Heel raises at EOB x 10 BLE Anterior weight shifting activities at EOB to address posterior leaning Static sitting x 10 minutes to address posterior instability with varying amounts of assist   Assessment/Plan    PT Assessment Patient needs continued PT services  PT Problem List Decreased strength;Decreased mobility;Decreased safety awareness;Decreased range of motion;Obesity;Decreased activity tolerance;Decreased balance;Pain       PT Treatment  Interventions Therapeutic exercise;Balance training;Functional mobility training;Therapeutic activities;Patient/family education    PT Goals (Current goals can be found in the Care Plan section)  Acute Rehab PT Goals PT Goal Formulation: Patient unable to participate in goal setting    Frequency Min 1X/week     Co-evaluation               AM-PAC PT 6 Clicks Mobility  Outcome Measure Help needed turning from your back to your side while in a flat bed without using bedrails?: Total Help needed moving from lying on your back to sitting on the side of a flat bed without using bedrails?: Total Help needed moving to and from a bed to a chair (including a wheelchair)?: Total Help needed standing up from a chair using your arms (e.g., wheelchair or bedside chair)?: Total Help needed to walk in hospital room?: Total Help needed climbing 3-5 steps with a railing? : Total 6 Click Score: 6    End of Session Equipment Utilized During Treatment: Oxygen Activity Tolerance: Patient limited by lethargy Patient left: in bed;with bed alarm set;with call bell/phone within reach;with family/visitor present Nurse Communication: Mobility status PT Visit Diagnosis: Muscle weakness (generalized) (M62.81)    Time: 8587-8553 PT Time Calculation (min) (ACUTE ONLY): 34 min   Charges:  Leontine Ingles, SPT 07/11/23, 4:48 PM

## 2023-07-11 NOTE — Progress Notes (Addendum)
 PHARMACY CONSULT NOTE - FOLLOW UP  Pharmacy Consult for Electrolyte Monitoring and Replacement   Recent Labs: Potassium (mmol/L)  Date Value  07/11/2023 2.9 (L)   Magnesium  (mg/dL)  Date Value  93/70/7974 2.1   Calcium  (mg/dL)  Date Value  93/70/7974 7.8 (L)   Albumin  (g/dL)  Date Value  93/74/7974 <1.5 (L)  02/25/2017 3.7   Phosphorus (mg/dL)  Date Value  93/73/7974 2.8   Sodium (mmol/L)  Date Value  07/11/2023 140  04/11/2020 140    Assessment:  73 y.o. African-American female with medical history significant for asthma, osteoarthritis, giant cell arthritis, combined systolic and diastolic CHF, COPD, DVT, PE and atrial fibrillation on Eliquis , who presented to the emergency room with acute onset of increased weakness. Pharmacy is asked to follow and replace electrolytes while in CCU  Free water  30 ml q4H Tube feeds Amio gtt.  Acyclovir  IV - nephrotoxic. Scr trending up. No Fluids ordered based on ID rph recs.   Diuretics: torsemide  10 > 20 mg po once daily  Goal of Therapy:  Electrolytes WNL  Plan:  Kcl 40 mEq x 2 + Kcl 10 mEq IV x 2 by medical team. Pharmacy will sign off. Pt transferred to 2A from ICU. Please re-consult if needed.    Wendy Sanchez ,PharmD Clinical Pharmacist 07/11/2023 7:51 AM

## 2023-07-12 DIAGNOSIS — G9341 Metabolic encephalopathy: Secondary | ICD-10-CM | POA: Diagnosis not present

## 2023-07-12 DIAGNOSIS — A4150 Gram-negative sepsis, unspecified: Secondary | ICD-10-CM | POA: Diagnosis not present

## 2023-07-12 DIAGNOSIS — I471 Supraventricular tachycardia, unspecified: Secondary | ICD-10-CM | POA: Diagnosis not present

## 2023-07-12 DIAGNOSIS — N390 Urinary tract infection, site not specified: Secondary | ICD-10-CM | POA: Diagnosis not present

## 2023-07-12 LAB — BASIC METABOLIC PANEL WITH GFR
Anion gap: 8 (ref 5–15)
BUN: 27 mg/dL — ABNORMAL HIGH (ref 8–23)
CO2: 27 mmol/L (ref 22–32)
Calcium: 7.9 mg/dL — ABNORMAL LOW (ref 8.9–10.3)
Chloride: 107 mmol/L (ref 98–111)
Creatinine, Ser: 1.08 mg/dL — ABNORMAL HIGH (ref 0.44–1.00)
GFR, Estimated: 55 mL/min — ABNORMAL LOW (ref >60)
Glucose, Bld: 131 mg/dL — ABNORMAL HIGH (ref 70–99)
Potassium: 3.7 mmol/L (ref 3.5–5.1)
Sodium: 142 mmol/L (ref 135–145)

## 2023-07-12 LAB — CBC
HCT: 27.8 % — ABNORMAL LOW (ref 36.0–46.0)
Hemoglobin: 9.1 g/dL — ABNORMAL LOW (ref 12.0–15.0)
MCH: 26.7 pg (ref 26.0–34.0)
MCHC: 32.7 g/dL (ref 30.0–36.0)
MCV: 81.5 fL (ref 80.0–100.0)
Platelets: 470 10*3/uL — ABNORMAL HIGH (ref 150–400)
RBC: 3.41 MIL/uL — ABNORMAL LOW (ref 3.87–5.11)
RDW: 20.1 % — ABNORMAL HIGH (ref 11.5–15.5)
WBC: 9.1 10*3/uL (ref 4.0–10.5)
nRBC: 1 % — ABNORMAL HIGH (ref 0.0–0.2)

## 2023-07-12 LAB — HEPARIN LEVEL (UNFRACTIONATED)
Heparin Unfractionated: 0.39 [IU]/mL (ref 0.30–0.70)
Heparin Unfractionated: 0.42 [IU]/mL (ref 0.30–0.70)

## 2023-07-12 LAB — VANCOMYCIN, RANDOM: Vancomycin Rm: 15 ug/mL

## 2023-07-12 LAB — GLUCOSE, CAPILLARY
Glucose-Capillary: 128 mg/dL — ABNORMAL HIGH (ref 70–99)
Glucose-Capillary: 128 mg/dL — ABNORMAL HIGH (ref 70–99)
Glucose-Capillary: 134 mg/dL — ABNORMAL HIGH (ref 70–99)
Glucose-Capillary: 134 mg/dL — ABNORMAL HIGH (ref 70–99)
Glucose-Capillary: 136 mg/dL — ABNORMAL HIGH (ref 70–99)
Glucose-Capillary: 143 mg/dL — ABNORMAL HIGH (ref 70–99)

## 2023-07-12 LAB — MAGNESIUM: Magnesium: 2 mg/dL (ref 1.7–2.4)

## 2023-07-12 LAB — DIGOXIN LEVEL: Digoxin Level: <0.4 ng/mL — ABNORMAL LOW (ref 0.8–2.0)

## 2023-07-12 MED ORDER — SODIUM CHLORIDE 0.9 % IV SOLN
2.0000 g | Freq: Three times a day (TID) | INTRAVENOUS | Status: DC
  Administered 2023-07-12 – 2023-07-13 (×3): 2 g via INTRAVENOUS
  Filled 2023-07-12 (×4): qty 2

## 2023-07-12 MED ORDER — POLYVINYL ALCOHOL 1.4 % OP SOLN
1.0000 [drp] | OPHTHALMIC | Status: DC | PRN
  Filled 2023-07-12: qty 15

## 2023-07-12 MED ORDER — APIXABAN 5 MG PO TABS
5.0000 mg | ORAL_TABLET | Freq: Two times a day (BID) | ORAL | Status: DC
  Administered 2023-07-12 – 2023-07-13 (×3): 5 mg via ORAL
  Filled 2023-07-12 (×3): qty 1

## 2023-07-12 MED ORDER — ACETAMINOPHEN 650 MG RE SUPP: 650.0000 mg | Freq: Four times a day (QID) | RECTAL | Status: DC | PRN

## 2023-07-12 MED ORDER — ACETAMINOPHEN 325 MG PO TABS
650.0000 mg | ORAL_TABLET | Freq: Four times a day (QID) | ORAL | Status: DC | PRN
  Administered 2023-07-12: 650 mg via ORAL
  Filled 2023-07-12: qty 2

## 2023-07-12 NOTE — Plan of Care (Signed)
   Problem: Respiratory: Goal: Ability to maintain adequate ventilation will improve Outcome: Progressing

## 2023-07-12 NOTE — TOC Progression Note (Signed)
 Transition of Care [facility]) - Progression Note    Patient Details  Name: GIANNY KILLMAN MRN: [id number] Date of Birth: 1950-09-19  [facility] CM/SW Contact  Tomasa JAYSON Childes, RN Phone Number: 07/12/2023, 3:16 PM  Clinical Narrative:    Spoke with family at bedside. Patient request to speak with her sister regarding SNF.     Expected Discharge Plan: Home/Self Care Barriers to Discharge: Continued Medical Work up  Expected Discharge Plan and Services   Discharge Planning Services: CM Consult   Living arrangements for the past 2 months: Single Family Home                             HH Agency: [facility]         Social Determinants of Health (SDOH) Interventions SDOH Screenings   Food Insecurity: No Food Insecurity (07/03/2023)  Housing: Low Risk  (07/03/2023)  Transportation Needs: Unmet Transportation Needs (07/03/2023)  Utilities: Not At Risk (07/03/2023)  Depression (PHQ2-9): Low Risk  (07/01/2022)  Financial Resource Strain: Low Risk  (12/26/2020)   Received from [facility]  Social Connections: Unknown (07/03/2023)  Recent Concern: Social Connections - Socially Isolated (07/03/2023)  Tobacco Use: High Risk (07/01/2023)    Readmission Risk Interventions    03/08/2023   11:09 AM 02/03/2021    9:12 AM  Readmission Risk Prevention Plan  Transportation Screening Complete Complete  Medication Review (RN Care Manager) Complete Complete  PCP or Specialist appointment within 3-5 days of discharge Complete Complete  HRI or Home Care Consult  Complete  SW Recovery Care/Counseling Consult Complete   Palliative Care Screening Not Applicable Not Applicable  Skilled Nursing Facility Complete Complete

## 2023-07-12 NOTE — Progress Notes (Signed)
 Progress Note   Patient: Wendy Sanchez FMW:[id number] DOB: 1950-09-19 DOA: 07/01/2023     11 DOS: the patient was seen and examined on 07/12/2023   Brief hospital course: From HPI Wendy Sanchez is a 73 y.o. African-American female with medical history significant for asthma, osteoarthritis, giant cell arthritis, combined systolic and diastolic CHF, COPD, DVT, PE and atrial fibrillation on Eliquis , who presented to the emergency room with acute onset of increased weakness.  Patient was hypotensive upon arriving to hospital, she also had a syncope due to hypotension. Patient also has tachycardia, tachypnea and leukocytosis.  She also has severe altered mental status, EEG did not show any evidence of seizure.  MRI of the brain poor quality, but did not show any acute changes.  LP was performed, could not collect any CSF. Her urine culture grow E. coli Serratia and Pseudomonas.  Patient was placed on broad-spectrum antibiotics.  Patient mental status improved in the morning of 6/28 after restarting Neurontin .  But the patient developed significant volume overload, started on IV Lasix .   Principal Problem:   Sepsis due to gram-negative UTI (HCC) Active Problems:   Lower urinary tract infection   Syncope   Hypotension   Chronic obstructive pulmonary disease (COPD) (HCC)   Peripheral neuropathy   Acute on chronic combined systolic and diastolic CHF (congestive heart failure) (HCC)   Paroxysmal atrial fibrillation (HCC)   GERD without esophagitis   Asthma-COPD overlap syndrome (HCC)   Reactive thrombocytosis   Adrenal crisis (HCC)   Weakness   Encephalopathy   Acute metabolic encephalopathy   Decreased cardiac ejection fraction   Paroxysmal SVT (supraventricular tachycardia) (HCC)   Assessment and Plan: Severe sepsis due to gram-negative UTI (HCC) Gram-negative urinary tract infection. Stage III sacral decubitus ulcer - Sepsis manifested by leukocytosis, tachycardia and tachypnea.   Patient also had a severe lactic acidosis of 4.5. Patient received sepsis fluid in the emergency room Sepsis also compounded by decubitus sacral ulcer Wound care on board; we appreciate input Culture results showing Pseudomonas in the wound, urine cultures growing E. coli, Serratia as well as Pseudomonas Patient is seen by infectious disease, currently treated with meropenem  and vancomycin .   Discussed with ID, will change antibiotic to oral.   Acute metabolic encephalopathy rule out possible bacterial meningitis or encephalitis Noted to have some tremors involving the right face and legs as well as bilateral upper extremities EEG obtained that did not show any epileptiform waves Patient initiated on Keppra  and neurology was consulted LP was attempted by IR today however unsuccessful Currently patient on vancomycin , meropenem  and acyclovir  According to ID pharmacist meropenem  will also cover Listeria (no need for ampicillin ) MRI of the brain with and without contrast showed a moderate motion artifact, but no apparent acute changes. Patient has been followed by neurology on a daily basis.  Discussed with Dr. Lindzen, this is unlikely due to cefepime  toxicity.  More likely due to metabolic encephalopathy from infection.  However, we will continue cover for meningitis. Repeated EEG 6/26 still show significant encephalopathy, no seizure activity. Dobbhoff was placed 6/26, tube feeding started. 6/28.  Patient was restarted on Neurontin  today, mental status suddenly improved.  Looks like encephalopathy is caused by Neurontin  withdrawal.  Meningitis at this moment seems unlikely.  If patient continue to improve, may consider scale back antibiotics. Patient mental status has improved, no concern for seizure disorder.  Will discontinue Keppra .  Also there is no concern for viral meningitis, will discontinue acyclovir .   Acute  on chronic combined systolic and diastolic congestive heart failure. Patient  had echocardiogram performed on 6/20, ejection fraction 45 to 50% with grade 1 diastolic dysfunction. Patient was given 60 mg IV Lasix  6/28.  Repeat BNP over 3000, chest x-ray showed vascular congestion with evidence of pulm edema.  Patient also has anasarca.  Discussed with cardiology, diuretics switched to furosemide  40 mg every 8 hours 6/19. Short of breath is better today, but still has significant volume overload.  Will continue current dose of IV Lasix .   Anemia of chronic disease. Reactive thrombocytopenia. Recent lab test showed slight decrease iron with elevated ferritin, elevated B12 level.  Condition consistent with anemia of chronic disease.   Hemoglobin relatively stable.  Continue to follow.   Syncope likely secondary to hypotension Adrenal crisis. Hypoglycemia secondary to adrenal crisis Continue neurochecks closely Echo showed EF 45 to 50% with grade 1 diastolic dysfunction Patient not fluid overloaded Patient had cortisol level of 2.5, she also had significant hypotension, hypoglycemia.  She has adrenal crisis.  She was chronically taking lower dose of Cortef , started stress dose hydrocortisone  at 100 mg every 8 hours 6/25. Patient condition started improving, reduced hydrocortisone  to 50 mg every 12 hours.  Condition still stable.   Hypokalemia Hyponatremia. Acute kidney injury. Patient had baseline creatinine around 0.7, increased to 1.14.  Creatinine is better after giving IV Lasix . Potassium normalized.    Chronic obstructive pulmonary disease (COPD) (HCC) ABG did not show any evidence of CO2 retention. Continue her as needed DuoNebs.   Peripheral neuropathy Restarted Neurontin .   GERD without esophagitis Continue PPI therapy   Paroxysmal atrial fibrillation (HCC) with RVR. Patient is followed by cardiology, heart rate more stable.  Still on heparin  drip, amiodarone .  Beta-blocker.  Heart rate better controlled. Change heparin  drip to Eliquis .  Still on  amiodarone  drip per cardiology.   Class II obesity with BMI 37.78. Complicated patient care as well as prognosis    Pressure ulcer POA  Stage 2 pressure ulcers bilateral ischial tuberosity          Subjective:  Patient short of breath is better, however, still has tachypnea with exertion.  No cough. Less confused, no agitation.  Physical Exam: Vitals:   07/12/23 0337 07/12/23 0500 07/12/23 0818 07/12/23 1204  BP: 101/74  103/70 107/69  Pulse: 90  86 87  Resp: 18     Temp: 97.7 F (36.5 C)  98.2 F (36.8 C) 97.9 F (36.6 C)  TempSrc:      SpO2: 100%  100% 100%  Weight:  124.5 kg    Height:       General exam: Appears calm and comfortable, morbid obese. Respiratory system: Significant decreased breathing sounds. Respiratory effort normal. Cardiovascular system: Appear regular. No JVD, murmurs, rubs, gallops or clicks.  Anasarca with 2+ leg edema. Gastrointestinal system: Abdomen is nondistended, soft and nontender. No organomegaly or masses felt. Normal bowel sounds heard. Central nervous system: Alert and oriented. No focal neurological deficits. Extremities: Symmetric 5 x 5 power. Skin: No rashes, lesions or ulcers Psychiatry: Mood & affect appropriate.    Data Reviewed:  Lab results reviewed  Family Communication: Daughter updated at bedside.  Disposition: Status is: Inpatient Remains inpatient appropriate because: Severity of disease, IV treatment, also pending nursing home placement     Time spent: 60 minutes  Author: Murvin Mana, MD 07/12/2023 12:10 PM  For on call review [email or web address].

## 2023-07-12 NOTE — Progress Notes (Signed)
 PHARMACY - ANTICOAGULATION CONSULT NOTE  Pharmacy Consult for heparin  infusion Indication: h/o DVT & PE  Allergies  Allergen Reactions   Cefepime  Other (See Comments)    Encephalopathy requiring transfer to ICU on 07/05/2023 (not allergy so may use other cephalosporins and beta lactams).     Hydrocodone  Swelling   Hydrocodone -Acetaminophen  Swelling    hands   Iodinated Contrast Media Anaphylaxis   Iodine Anaphylaxis and Swelling    Iv dye  02/03/21-Ispoke to patient and she had IV contrast  X4 last year and has had no problem. ( Last CT abdomen with contrast was on 01/02/21.-   Nitrofurantoin    Other Other (See Comments)    ALLERGY TO METAL - BLACKENS SKIN AND CAUSES A RASH   Tape Swelling and Other (See Comments)    Skin comes off.  Paper tape is ok tegaderm OK   Shellfish-Derived Products    Wound Dressing Adhesive    Peanut Oil Other (See Comments)    ** Nuts cause runny nose   Shellfish Allergy Nausea And Vomiting and Swelling    Episode of GI infection after eating clam chowder. Still eats shrimp and other seafood    Patient Measurements: Height: 5' 8 (172.7 cm) Weight: 124.5 kg (274 lb 7.6 oz) IBW/kg (Calculated) : 63.9 Heparin  Dosing Weight: 91.1 kg   Vital Signs: Temp: 98.2 F (36.8 C) (06/30 0818) BP: 103/70 (06/30 0818) Pulse Rate: 86 (06/30 0818)  Labs: Recent Labs    07/10/23 0459 07/10/23 1452 07/10/23 2332 07/11/23 0538 07/11/23 1452 07/12/23 0032 07/12/23 0539 07/12/23 1106  HGB 8.8*  --   --  8.3*  --   --  9.1*  --   HCT 24.9*  --   --  24.0*  --   --  27.8*  --   PLT 485*  --   --  412*  --   --  470*  --   APTT 75*   < > 93* 108* 59*  --   --   --   HEPARINUNFRC 0.18*   < > 0.27*  --  0.20* 0.39  --  0.42  CREATININE 1.13*  --   --  1.14*  --   --  1.08*  --    < > = values in this interval not displayed.    Estimated Creatinine Clearance: 65.5 mL/min (A) (by C-G formula based on SCr of 1.08 mg/dL (H)).   Medical History: Past  Medical History:  Diagnosis Date   Allergic rhinitis    Arthritis    Asthma    problems with fumes and aerosols cause asthma   Chest pain 10/19/2016   CHF (congestive heart failure) (HCC)    Complication of anesthesia    awakens during surgery; has occurred with last 3-4 surgeries    COPD (chronic obstructive pulmonary disease) (HCC)    DVT (deep venous thrombosis) (HCC) 07/13/2018   Right leg   Environmental allergies    fumes    Fibromyalgia    GERD (gastroesophageal reflux disease)    Headache    History of bronchitis    Hx of total knee arthroplasty 12/13/2015   Hyperlipidemia    Hypertension    Morbid obesity with BMI of 45.0-49.9, adult (HCC) 07/25/2015   NICM (nonischemic cardiomyopathy) (HCC)    a. ? PVC mediated;  b. 06/2016 Echo: EF 25-30%, mild LVH, mild LAE, mild AI/MR/TR/PR; c. 08/2016 Cath: nl cors, EF 25%.   Numbness    hands bilat when driving;  improves when not preforming task    Osteoarthritis of left knee 08/02/2015   PE (pulmonary thromboembolism) (HCC) 07/13/2018   Pes anserinus bursitis of left knee 05/18/2016   Presence of right artificial knee joint 05/18/2016   PVC (premature ventricular contraction) 06/04/2017   PVC's (premature ventricular contractions)    a. 11/2016 Amio started;  b. 11/29/16 48h Holter: 57574 PVC's (41%); c. 12/2016 24h Holter: 18040 PVC's (15%); c. 02/2017 48h Holter: 37262 PVC's (41%).   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   Temporal arteritis (HCC) 05/01/2020   Tinnitus    comes and goes    Unilateral primary osteoarthritis, right knee 12/13/2015   Vertigo    none for over 2 yrs    Medications:  Scheduled:   vitamin C   500 mg Per Tube BID   calcium  carbonate  1 tablet Per Tube TID WC   Chlorhexidine  Gluconate Cloth  6 each Topical Daily   copper   2 mg Per Tube Daily   feeding supplement  (PROSource TF20)  60 mL Per Tube Daily   free water   30 mL Per Tube Q4H   furosemide   40 mg Intravenous Q8H   gabapentin   100 mg Per Tube Q8H   hydrocortisone  sod succinate (SOLU-CORTEF ) inj  50 mg Intravenous Q12H   levETIRAcetam   1,000 mg Intravenous Q12H   liver oil-zinc  oxide   Topical BID   metoprolol  succinate  12.5 mg Oral Daily   multivitamin with minerals  1 tablet Per Tube BID   nystatin  cream   Topical BID   mouth rinse  15 mL Mouth Rinse 4 times per day   pantoprazole  (PROTONIX ) IV  40 mg Intravenous QHS   QUEtiapine   25 mg Oral QHS   thiamine   100 mg Per Tube Daily   vancomycin  variable dose per unstable renal function (pharmacist dosing)   Does not apply See admin instructions   zinc  sulfate (50mg  elemental zinc )  220 mg Per Tube Daily   Infusions:   acyclovir  880 mg (07/12/23 1047)   amiodarone  30 mg/hr (07/12/23 0756)   feeding supplement (OSMOLITE 1.5 CAL) 50 mL/hr at 07/12/23 0858   heparin  1,150 Units/hr (07/12/23 0756)   meropenem  (MERREM ) IV 2 g (07/12/23 0955)   promethazine  (PHENERGAN ) injection (IM or IVPB)      Assessment: 73 y.o. African-American female with medical history significant for asthma, osteoarthritis, giant cell arthritis, combined systolic and diastolic CHF, COPD, DVT, PE and atrial fibrillation on Eliquis , who presented to the emergency room with acute onset of increased weakness. Last dose of apixaban  07/05/23 0919   Goal of Therapy:  anti-Xa level 0.3-0.7 units/ml aPTT 66 - 102 seconds Monitor platelets by anticoagulation protocol: Yes  06/25 2314 aPTT 161 (verbal), supratherapeutic 06/26 1802 aPTT 96 06/27 0530 aPTT 102, borderline supratherapeutic 06/28 0459 aPTT 75, therapeutic / HL 0.18, subtherapeutic 06/28 1452 aPTT 110/HL0.33 06/28 2332 aPTT 93, therapeutic x 1 / HL 0.27 06/29 0538 aPTT 108, supratherapeutic 06/29 1452 aPTT 59/HL 0.20 06/30 0032 HL 0.39, therapeutic x 1 0630 1106 HL 0.42.    Plan:  Heparin  level is  therapeutic. Will continue heparin  infusion at  1150 units/hr. Recheck heparin  level and CBC with AM labs.   Cathaleen Blanch, PharmD,  07/12/2023 11:53 AM

## 2023-07-12 NOTE — Progress Notes (Signed)
 Nutrition Follow-up  DOCUMENTATION CODES:   Morbid obesity, Non-severe (moderate) malnutrition in context of chronic illness  INTERVENTION:   -RD will follow for diet advancement and add supplements as appropriate -Continue TF via NGT:    Osmolite 1.5 @ 50 ml/hr    60 ml Prosource TF20 daily   30 ml free water  flush every 4 hours   Tube feeding regimen provides 1880 kcal (100% of needs), 95 grams of protein, and 914 ml of H2O.  Total free water : 1094 ml daily   -Continue 500 mg vitamin C  BID -Continue 220 mg zinc  sulfate daily 14 days -Continue MVI with minerals BID -Continue 500 mg calcium  carbonate TID -Continue 100 mg thiamine  daily x 7 days -Monitor Mg, K, and Phos daily and replete as needed secondary to high refeeding risk -Continue 2 mg copper  daily x 30 days  NUTRITION DIAGNOSIS:   Moderate Malnutrition related to chronic illness as evidenced by mild fat depletion, moderate fat depletion, mild muscle depletion, moderate muscle depletion.  Ongoing  GOAL:   Patient will meet greater than or equal to 90% of their needs  Met with TF  MONITOR:   TF tolerance  REASON FOR ASSESSMENT:   Consult Wound healing  ASSESSMENT:   Pt with medical history significant for asthma, osteoarthritis, giant cell arthritis, combined systolic and diastolic CHF, COPD, DVT, PE and atrial fibrillation on Eliquis , who presented with acute onset of increased weakness.  6/23- s/p EEG- revealed moderate diffuse encephalopathy typically related to toxic-metabolic causes like cefepime  toxicity, rapid response- transferred to ICU 6/24- LP attempted- no return of CSF fluid 6/26- NGT placed (KUB verified placement in stomach)  Reviewed I/O's: -2.7 L x 24 hours and -556 ml since admission  UOP: 2.3 L x 24 hours  Colostomy output: 325 ml x 24 hours  Pt resting in bed at time of visit. No family at bedside. Mental status has improved since last visit; pt able to answer simple close ended  questions. She denies overall pain, abdominal pain, nausea, and vomiting.   Pt remains NPO. She remains with NGT; infusing with Osmolite 1.5 at goal rate of 50 ml/hr. Pt tolerating well.   Wt has been stable since admission.   Medications reviewed and include vitamin C , calcium  carbonate, copper , lasix , neurontin , keppra , solu-cortef , protonix , thiamine , amiodarone , and zinc   Labs reviewed: K, Mg, and Phos WDL. CBGS: 128-143 (inpatient orders for glycemic control are none).    NUTRITION - FOCUSED PHYSICAL EXAM:  Flowsheet Row Most Recent Value  Orbital Region Mild depletion  Upper Arm Region Mild depletion  Thoracic and Lumbar Region No depletion  Buccal Region Mild depletion  Temple Region Severe depletion  Clavicle Bone Region Mild depletion  Clavicle and Acromion Bone Region Mild depletion  Scapular Bone Region Mild depletion  Dorsal Hand No depletion  Patellar Region Mild depletion  Anterior Thigh Region Mild depletion  Posterior Calf Region Mild depletion  Edema (RD Assessment) Mild  Hair Reviewed  Eyes Reviewed  Mouth Reviewed  Skin Reviewed  Nails Reviewed    Diet Order:   Diet Order     None       EDUCATION NEEDS:   Education needs have been addressed  Skin:  Skin Assessment: Skin Integrity Issues: Skin Integrity Issues:: Stage III Stage III: sacrum  Last BM:  07/06/23 (via colostomy)  Height:   Ht Readings from Last 1 Encounters:  07/01/23 5' 8 (1.727 m)    Weight:   Wt Readings from Last 1 Encounters:  07/12/23 124.5 kg    Ideal Body Weight:  63.6 kg  BMI:  Body mass index is 41.73 kg/m.  Estimated Nutritional Needs:   Kcal:  1900-2100  Protein:  95-110 grams  Fluid:  1.9-2.1 L    Margery ORN, RD, LDN, CDCES Registered Dietitian III Certified Diabetes Care and Education Specialist If unable to reach this RD, please use RD Inpatient group chat on secure chat between hours of 8am-4 pm daily

## 2023-07-12 NOTE — Progress Notes (Signed)
 INFECTIOUS DISEASE PROGRESS NOTE Date of Admission:  07/01/2023     ID: Wendy Sanchez is a 73 y.o. female with sepsis, ams Principal Problem:   Sepsis due to gram-negative UTI (HCC) Active Problems:   Chronic obstructive pulmonary disease (COPD) (HCC)   Lower urinary tract infection   Hypotension   Peripheral neuropathy   Acute on chronic combined systolic and diastolic CHF (congestive heart failure) (HCC)   Syncope   Paroxysmal atrial fibrillation (HCC)   GERD without esophagitis   Asthma-COPD overlap syndrome (HCC)   Reactive thrombocytosis   Adrenal crisis (HCC)   Weakness   Encephalopathy   Acute metabolic encephalopathy   Decreased cardiac ejection fraction   Paroxysmal SVT (supraventricular tachycardia) (HCC)   Subjective: Out of unit.  Much more interactive but still confused.  No fevers.  No further clonus.  Dahlem count down to 9.  Has been on meropenem .  Mycin level has still been therapeutic.  ROS  Unable to obtain  Medications:  Antibiotics Given (last 72 hours)     Date/Time Action Medication Dose Rate   07/09/23 2215 New Bag/Given   acyclovir  (ZOVIRAX ) 880 mg in dextrose  5 % 250 mL IVPB 880 mg 267.6 mL/hr   07/09/23 2233 New Bag/Given   meropenem  (MERREM ) 2 g in sodium chloride  0.9 % 100 mL IVPB 2 g 280 mL/hr   07/10/23 0906 New Bag/Given   acyclovir  (ZOVIRAX ) 880 mg in dextrose  5 % 250 mL IVPB 880 mg 267.6 mL/hr   07/10/23 0907 New Bag/Given   meropenem  (MERREM ) 2 g in sodium chloride  0.9 % 100 mL IVPB 2 g 280 mL/hr   07/10/23 2140 New Bag/Given   acyclovir  (ZOVIRAX ) 880 mg in dextrose  5 % 250 mL IVPB 880 mg 267.6 mL/hr   07/10/23 2343 New Bag/Given   meropenem  (MERREM ) 2 g in sodium chloride  0.9 % 100 mL IVPB 2 g 280 mL/hr   07/11/23 1059 New Bag/Given   meropenem  (MERREM ) 2 g in sodium chloride  0.9 % 100 mL IVPB 2 g 280 mL/hr   07/11/23 1101 New Bag/Given   acyclovir  (ZOVIRAX ) 880 mg in dextrose  5 % 250 mL IVPB 880 mg 267.6 mL/hr   07/11/23 2142 New  Bag/Given   meropenem  (MERREM ) 2 g in sodium chloride  0.9 % 100 mL IVPB 2 g 280 mL/hr   07/11/23 2236 New Bag/Given   acyclovir  (ZOVIRAX ) 880 mg in dextrose  5 % 250 mL IVPB 880 mg 267.6 mL/hr   07/12/23 0955 New Bag/Given   meropenem  (MERREM ) 2 g in sodium chloride  0.9 % 100 mL IVPB 2 g 280 mL/hr   07/12/23 1047 New Bag/Given   acyclovir  (ZOVIRAX ) 880 mg in dextrose  5 % 250 mL IVPB 880 mg 267.6 mL/hr       apixaban   5 mg Oral BID   vitamin C   500 mg Per Tube BID   calcium  carbonate  1 tablet Per Tube TID WC   Chlorhexidine  Gluconate Cloth  6 each Topical Daily   copper   2 mg Per Tube Daily   feeding supplement (PROSource TF20)  60 mL Per Tube Daily   free water   30 mL Per Tube Q4H   furosemide   40 mg Intravenous Q8H   gabapentin   100 mg Per Tube Q8H   hydrocortisone  sod succinate (SOLU-CORTEF ) inj  50 mg Intravenous Q12H   liver oil-zinc  oxide   Topical BID   metoprolol  succinate  12.5 mg Oral Daily   multivitamin with minerals  1 tablet Per Tube  BID   nystatin  cream   Topical BID   mouth rinse  15 mL Mouth Rinse 4 times per day   pantoprazole  (PROTONIX ) IV  40 mg Intravenous QHS   QUEtiapine   25 mg Oral QHS   thiamine   100 mg Per Tube Daily   vancomycin  variable dose per unstable renal function (pharmacist dosing)   Does not apply See admin instructions   zinc  sulfate (50mg  elemental zinc )  220 mg Per Tube Daily    Objective: Vital signs in last 24 hours: Temp:  [97 F (36.1 C)-98.5 F (36.9 C)] 98.1 F (36.7 C) (06/30 1544) Pulse Rate:  [83-90] 86 (06/30 1544) Resp:  [13-22] 13 (06/30 1204) BP: (94-109)/(64-80) 102/73 (06/30 1544) SpO2:  [100 %] 100 % (06/30 1544) Weight:  [124.5 kg] 124.5 kg (06/30 0500)   Constitutional:  chronically ill appearing, sitting up in bed a little more responsive. Cardiovascular: Normal rate, regular rhythm and normal heart sounds.  Pulmonary/Chest: Effort normal and breath sounds normal. No respiratory distress.  has no wheezes.  Neck =  supple, no nuchal rigidity Abdominal: Soft. Bowel sounds are normal.  exhibits no distension. There is no tenderness.  Lymphadenopathy: no cervical adenopathy. No axillary adenopathy Neurological: confused, + myoclonus Skin:    Lab Results Recent Labs    07/11/23 0538 07/12/23 0539  WBC 7.6 9.1  HGB 8.3* 9.1*  HCT 24.0* 27.8*  NA 140 142  K 2.9* 3.7  CL 107 107  CO2 25 27  BUN 27* 27*  CREATININE 1.14* 1.08*    Microbiology: Results for orders placed or performed during the hospital encounter of 07/01/23  Blood culture (routine x 2)     Status: None   Collection Time: 07/01/23  4:45 PM   Specimen: BLOOD  Result Value Ref Range Status   Specimen Description BLOOD BLOOD RIGHT HAND  Final   Special Requests   Final    BOTTLES DRAWN AEROBIC AND ANAEROBIC Blood Culture results may not be optimal due to an inadequate volume of blood received in culture bottles   Culture   Final    NO GROWTH 5 DAYS Performed at [facility], [address], [place], [place] [address]    Report Status 07/06/2023 FINAL  Final  Blood culture (routine x 2)     Status: None   Collection Time: 07/01/23  4:45 PM   Specimen: BLOOD  Result Value Ref Range Status   Specimen Description BLOOD BLOOD LEFT ARM  Final   Special Requests   Final    BOTTLES DRAWN AEROBIC ONLY Blood Culture adequate volume   Culture   Final    NO GROWTH 5 DAYS Performed at [facility], [address], [place], [place] [address]    Report Status 07/06/2023 FINAL  Final  Culture, Urine (Do not remove urinary catheter, catheter placed by urology or difficult to place)     Status: Abnormal   Collection Time: 07/01/23  4:45 PM   Specimen: Urine, Catheterized  Result Value Ref Range Status   Specimen Description   Final    URINE, CATHETERIZED Performed at [facility], [address] [place], [place] [address]    Special Requests   Final    NONE Performed at [facility], [address] [place], [place] [address]    Culture (A)  Final    >=100,000 COLONIES/mL ESCHERICHIA COLI >=100,000 COLONIES/mL SERRATIA MARCESCENS >=100,000 COLONIES/mL PSEUDOMONAS AERUGINOSA    Report Status 07/05/2023 FINAL  Final  Organism ID, Bacteria ESCHERICHIA COLI (A)  Final   Organism ID, Bacteria SERRATIA MARCESCENS (A)  Final   Organism ID, Bacteria PSEUDOMONAS AERUGINOSA (A)  Final      Susceptibility   Escherichia coli - MIC*    AMPICILLIN  16 INTERMEDIATE Intermediate     CEFAZOLIN  <=4 SENSITIVE Sensitive     CEFEPIME  <=0.12 SENSITIVE Sensitive     CEFTRIAXONE  <=0.25 SENSITIVE Sensitive     CIPROFLOXACIN >=4 RESISTANT Resistant     GENTAMICIN <=1 SENSITIVE Sensitive     IMIPENEM <=0.25 SENSITIVE Sensitive     NITROFURANTOIN <=16 SENSITIVE Sensitive     TRIMETH/SULFA <=20 SENSITIVE Sensitive     AMPICILLIN /SULBACTAM 4 SENSITIVE Sensitive     PIP/TAZO <=4 SENSITIVE Sensitive ug/mL    * >=100,000 COLONIES/mL ESCHERICHIA COLI   Pseudomonas aeruginosa - MIC*    CEFTAZIDIME 2 SENSITIVE Sensitive     CIPROFLOXACIN <=0.25 SENSITIVE Sensitive     GENTAMICIN <=1 SENSITIVE Sensitive     IMIPENEM <=0.25 SENSITIVE Sensitive     PIP/TAZO <=4 SENSITIVE Sensitive ug/mL    CEFEPIME  2 SENSITIVE Sensitive     * >=100,000 COLONIES/mL PSEUDOMONAS AERUGINOSA   Serratia marcescens - MIC*    CEFEPIME  <=0.12 SENSITIVE Sensitive     CEFTRIAXONE  <=0.25 SENSITIVE Sensitive     CIPROFLOXACIN <=0.25 SENSITIVE Sensitive     GENTAMICIN <=1 SENSITIVE Sensitive     NITROFURANTOIN 128 RESISTANT Resistant     TRIMETH/SULFA <=20 SENSITIVE Sensitive     * >=100,000 COLONIES/mL SERRATIA MARCESCENS  Aerobic/Anaerobic Culture w Gram Stain (surgical/deep wound)     Status: None   Collection Time: 07/02/23 12:38 AM   Specimen: Wound  Result Value Ref Range Status   Specimen Description   Final    WOUND Performed at [facility], [address], [place], [place] [address]    Special  Requests   Final    NONE Performed at [facility], [address] [place], [place] [address]    Gram Stain   Final    FEW WBC PRESENT, PREDOMINANTLY PMN NO ORGANISMS SEEN    Culture   Final    RARE PSEUDOMONAS AERUGINOSA MODERATE CORYNEBACTERIUM STRIATUM Standardized susceptibility testing for this organism is not available. RARE BACTEROIDES FRAGILIS BETA LACTAMASE POSITIVE Performed at [facility], [address], [place], [place] [address]    Report Status 07/05/2023 FINAL  Final   Organism ID, Bacteria PSEUDOMONAS AERUGINOSA  Final      Susceptibility   Pseudomonas aeruginosa - MIC*    CEFTAZIDIME 2 SENSITIVE Sensitive     CIPROFLOXACIN <=0.25 SENSITIVE Sensitive     GENTAMICIN <=1 SENSITIVE Sensitive     IMIPENEM <=0.25 SENSITIVE Sensitive     PIP/TAZO <=4 SENSITIVE Sensitive ug/mL    CEFEPIME  2 SENSITIVE Sensitive     * RARE PSEUDOMONAS AERUGINOSA  Respiratory (~20 pathogens) panel by PCR     Status: None   Collection Time: 07/02/23  2:47 PM   Specimen: Nasopharyngeal Swab; Respiratory  Result Value Ref Range Status   Adenovirus NOT DETECTED NOT DETECTED Final   Coronavirus 229E NOT DETECTED NOT DETECTED Final    Comment: (NOTE) The Coronavirus on the Respiratory Panel, DOES NOT test for the novel  Coronavirus (2019 nCoV)    Coronavirus HKU1 NOT DETECTED NOT DETECTED Final   Coronavirus NL63 NOT DETECTED NOT DETECTED Final   Coronavirus OC43 NOT DETECTED NOT DETECTED Final   Metapneumovirus NOT DETECTED NOT DETECTED Final   Rhinovirus / Enterovirus NOT  DETECTED NOT DETECTED Final   Influenza A NOT DETECTED NOT DETECTED Final   Influenza B NOT DETECTED NOT DETECTED Final   Parainfluenza Virus 1 NOT DETECTED NOT DETECTED Final   Parainfluenza Virus 2 NOT DETECTED NOT DETECTED Final   Parainfluenza Virus 3 NOT DETECTED NOT DETECTED Final   Parainfluenza Virus 4 NOT DETECTED NOT DETECTED Final   Respiratory Syncytial Virus NOT DETECTED NOT DETECTED  Final   Bordetella pertussis NOT DETECTED NOT DETECTED Final   Bordetella Parapertussis NOT DETECTED NOT DETECTED Final   Chlamydophila pneumoniae NOT DETECTED NOT DETECTED Final   Mycoplasma pneumoniae NOT DETECTED NOT DETECTED Final    Comment: Performed at [facility], [address] [place], [place] [address]  Culture, blood (x 2)     Status: None   Collection Time: 07/06/23  2:46 AM   Specimen: BLOOD  Result Value Ref Range Status   Specimen Description BLOOD BLOOD LEFT ARM  Final   Special Requests   Final    BOTTLES DRAWN AEROBIC AND ANAEROBIC Blood Culture adequate volume   Culture   Final    NO GROWTH 5 DAYS Performed at [facility], [address] [place], [place] [address]    Report Status 07/11/2023 FINAL  Final  Culture, blood (x 2)     Status: None   Collection Time: 07/06/23  2:46 AM   Specimen: BLOOD  Result Value Ref Range Status   Specimen Description BLOOD BLOOD LEFT HAND  Final   Special Requests   Final    BOTTLES DRAWN AEROBIC AND ANAEROBIC Blood Culture adequate volume   Culture   Final    NO GROWTH 5 DAYS Performed at [facility], [address], [place], [place] [address]    Report Status 07/11/2023 FINAL  Final  MRSA Next Gen by PCR, Nasal     Status: None   Collection Time: 07/08/23 11:52 PM   Specimen: Nasal Mucosa; Nasal Swab  Result Value Ref Range Status   MRSA by PCR Next Gen NOT DETECTED NOT DETECTED Final    Comment: (NOTE) The GeneXpert MRSA Assay (FDA approved for NASAL specimens only), is one component of a comprehensive MRSA colonization surveillance program. It is not intended to diagnose MRSA infection nor to guide or monitor treatment for MRSA infections. Test performance is not FDA approved in patients less than 70 years old. Performed at [facility], [address], [place], [place] [address]     Studies/Results: [facility] Chest Port 1 View Result Date: 07/11/2023 CLINICAL DATA:  Acute on  chronic CHF. EXAM: PORTABLE CHEST 1 VIEW COMPARISON:  07/06/2023. FINDINGS: Stable cardiomegaly. Aortic atherosclerosis. Persistent dense left basilar opacity, favored to reflect atelectasis. Pulmonary vascular congestion with possible mild interstitial edema. No pneumothorax. No acute osseous abnormality. IMPRESSION: 1. Persistent pulmonary vascular congestion with possible mild interstitial edema. 2. Similar dense left basilar opacity, favored to reflect atelectasis. Electronically Signed   By: Harrietta Sherry M.D.   On: 07/11/2023 09:52    Assessment/Plan: Wendy Sanchez is a 73 y.o. female chronically quite ill woman who is bedbound and has contractures and a colostomy as well as a Foley admitted June 19 with acute onset of weakness.  She has a history of asthma osteoarthritis giant cell arteritis CHF COPD DVT PE A-fib on Eliquis .  She has a chronic Foley and it was noted to have sediment in it.  She also has a sacral wound that was tunneling deeper.  In the ED  she was hypotensive but alert.  She was afebrile.  She had had a Krotzer count of 12 normal renal function lactate of 1.7 anemia with hemoglobin of 9.3 elevated platelets.  Viral respiratory panel was negative.  Urinalysis showed 21-50 Solimine cells.  Imaging initially showed CT of the head generalized cerebral atrophy but negative otherwise.  She was started on cefepime  and vancomycin  June 19.  She then had what appeared to be either a syncopal episode or seizure in the ED. She has been seen by neurology and is undergoing a lumbar puncture.  She had carotid Dopplers done At baseline she is bedbound has chronic ankle contractures plantarflexion and has a colostomy as well as a Foley catheter.  She was at [employer] before coming home where her sister cares for her   Microdata reviewed and urine culture from her Foley catheter grew E. coli, Serratia and Pseudomonas.  These were all sensitive to cefepime  which she is continued on. I suspect the  neuro changes are cefepime  toxicity. Doubt meningitis but LP cannot be done.    6/26- no fever, wbc down. Still encephalopathic and with some clonus  6/30-out of unit.  No longer with myoclonus. Recommendations Can stop meningitis treatment- dc vanco, dc acyclovir  Change meropenem  to ceftazidime for the wound and urine infection Can consider deescalate to oral cipro and Flagyl  when ready for discharge. Will need aggressive wound care, pressure relief etc. for wound healing.   HX C diff at last admission no diarrhea.  Thank you very much for the consult. Will follow with you.  Alm SHAUNNA Needle   07/12/2023, 4:19 PM

## 2023-07-12 NOTE — Progress Notes (Signed)
 Rounding Note   Patient Name: Wendy Sanchez Date of Encounter: 07/12/2023   [facility] Cardiologist: Timothy Gollan, MD   Subjective Mental status appears to be improving. Patient is able to tell me good morning. One episode of SVT overnight lasting about 40 minutes. Overall burden appears to be improving. She was transitioned to IV Lasix  yesterday in the setting of volume overload with UOP -2.7 L. Kidney function is stable. K 3.7.  Scheduled Meds:  vitamin C   500 mg Per Tube BID   calcium  carbonate  1 tablet Per Tube TID WC   Chlorhexidine  Gluconate Cloth  6 each Topical Daily   copper   2 mg Per Tube Daily   feeding supplement (PROSource TF20)  60 mL Per Tube Daily   free water   30 mL Per Tube Q4H   furosemide   40 mg Intravenous Q8H   gabapentin   100 mg Per Tube Q8H   hydrocortisone  sod succinate (SOLU-CORTEF ) inj  50 mg Intravenous Q12H   levETIRAcetam   1,000 mg Intravenous Q12H   liver oil-zinc  oxide   Topical BID   metoprolol  succinate  12.5 mg Oral Daily   multivitamin with minerals  1 tablet Per Tube BID   nystatin  cream   Topical BID   mouth rinse  15 mL Mouth Rinse 4 times per day   pantoprazole  (PROTONIX ) IV  40 mg Intravenous QHS   QUEtiapine   25 mg Oral QHS   thiamine   100 mg Per Tube Daily   vancomycin  variable dose per unstable renal function (pharmacist dosing)   Does not apply See admin instructions   zinc  sulfate (50mg  elemental zinc )  220 mg Per Tube Daily   Continuous Infusions:  acyclovir  Stopped (07/11/23 2336)   amiodarone  30 mg/hr (07/12/23 0756)   feeding supplement (OSMOLITE 1.5 CAL) 50 mL/hr at 07/12/23 0858   heparin  1,150 Units/hr (07/12/23 0756)   meropenem  (MERREM ) IV 2 g (07/12/23 0955)   promethazine  (PHENERGAN ) injection (IM or IVPB)     PRN Meds: acetaminophen  **OR** acetaminophen , albuterol , digoxin , diphenhydrAMINE , HYDROmorphone  (DILAUDID ) injection, liver oil-zinc  oxide, magnesium  hydroxide, melatonin, metoprolol  tartrate,  ondansetron  **OR** ondansetron  (ZOFRAN ) IV, mouth rinse, oxyCODONE -acetaminophen  **AND** oxyCODONE , polyethylene glycol, promethazine  (PHENERGAN ) injection (IM or IVPB)   Vital Signs  Vitals:   07/12/23 0143 07/12/23 0337 07/12/23 0500 07/12/23 0818  BP: 109/80 101/74  103/70  Pulse:  90  86  Resp: (!) 22 18    Temp:  97.7 F (36.5 C)  98.2 F (36.8 C)  TempSrc:      SpO2: 100% 100%  100%  Weight:   124.5 kg   Height:        Intake/Output Summary (Last 24 hours) at 07/12/2023 1010 Last data filed at 07/12/2023 0756 Gross per 24 hour  Intake 4457.68 ml  Output 2610 ml  Net 1847.68 ml      07/12/2023    5:00 AM 07/11/2023    4:57 AM 07/10/2023    5:00 AM  Last 3 Weights  Weight (lbs) 274 lb 7.6 oz 269 lb 10 oz 273 lb 5.9 oz  Weight (kg) 124.5 kg 122.3 kg 124 kg      Telemetry Sinus rhythm with PVCs, NSVT up to 7 beats. One episode of SVT lasting ~40 minutes. - Personally Reviewed  Physical Exam  GEN: Alert but somnolent Neck: Difficult to assess JVD Cardiac: RRR, no murmurs, rubs, or gallops.  Respiratory: Clear to auscultation bilaterally. GI: Soft, nontender, non-distended  MS: 1-2+ pitting edema; No deformity. Neuro:  Nonfocal  Psych: Normal affect   Labs High Sensitivity Troponin:   Recent Labs  Lab 07/01/23 1936  TROPONINIHS 30*     Chemistry Recent Labs  Lab 07/05/23 2336 07/06/23 1017 07/07/23 0318 07/08/23 0437 07/10/23 0459 07/11/23 0538 07/12/23 0539  NA 136 138 135   < > 136 140 142  K 3.1* 2.8* 3.2*   < > 3.4* 2.9* 3.7  CL 105 107 106   < > 105 107 107  CO2 24 25 21*   < > 22 25 27   GLUCOSE 90 75 70   < > 126* 138* 131*  BUN 22 19 16    < > 23 27* 27*  CREATININE 0.88 0.83 0.70   < > 1.13* 1.14* 1.08*  CALCIUM  7.4* 7.4* 7.6*   < > 7.8* 7.8* 7.9*  MG  --   --  1.7   < > 2.2 2.1 2.0  PROT 4.3* 4.2*  --   --   --   --   --   ALBUMIN  <1.5* <1.5* <1.5*  --   --   --   --   AST 24 23  --   --   --   --   --   ALT 16 14  --   --   --   --   --    ALKPHOS 123 122  --   --   --   --   --   BILITOT 0.3 0.6  --   --   --   --   --   GFRNONAA >60 >60 >60   < > 52* 51* 55*  ANIONGAP 7 6 8    < > 9 8 8    < > = values in this interval not displayed.    Lipids No results for input(s): CHOL, TRIG, HDL, LABVLDL, LDLCALC, CHOLHDL in the last 168 hours.  Hematology Recent Labs  Lab 07/10/23 0459 07/11/23 0538 07/12/23 0539  WBC 7.1 7.6 9.1  RBC 3.22* 3.09* 3.41*  HGB 8.8* 8.3* 9.1*  HCT 24.9* 24.0* 27.8*  MCV 77.3* 77.7* 81.5  MCH 27.3 26.9 26.7  MCHC 35.3 34.6 32.7  RDW 17.9* 18.6* 20.1*  PLT 485* 412* 470*   Thyroid  No results for input(s): TSH, FREET4 in the last 168 hours.  BNP Recent Labs  Lab 07/11/23 0533  BNP 3,333.6*    DDimer No results for input(s): DDIMER in the last 168 hours.   Radiology  DG Chest Port 1 View Result Date: 07/11/2023 IMPRESSION: 1. Persistent pulmonary vascular congestion with possible mild interstitial edema. 2. Similar dense left basilar opacity, favored to reflect atelectasis. Electronically Signed   By: Harrietta Sherry M.D.   On: 07/11/2023 09:52   Cardiac Studies  07/02/2023 Echo complete 1. Left ventricular ejection fraction, by estimation, is 45 to 50%. The  left ventricle has mildly decreased function. The left ventricle  demonstrates global hypokinesis. The left ventricular internal cavity size  was mildly dilated. There is mild  concentric left ventricular hypertrophy. Left ventricular diastolic  parameters are consistent with Grade I diastolic dysfunction (impaired  relaxation).   2. Right ventricular systolic function is normal. The right ventricular  size is normal.   3. A small pericardial effusion is present. The pericardial effusion is  posterior to the left ventricle.   4. The mitral valve is grossly normal. Trivial mitral valve  regurgitation.   5. The aortic valve is grossly normal. Aortic valve regurgitation is not  visualized.  Patient Profile    73 y.o. 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 difficile, presenting with weakness, UTI, worsening sacral wound, syncope in the ER Several episodes of narrow complex tachycardia lasting less than 1 hour consistent with SVT, previously treated with amiodarone   Assessment & Plan   Paroxysmal SVT - Known history of SVT, previously on amiodarone  03/2023 which was stopped as outpatient when prescription ran out - Multiple episodes of SVT noted during this admission, lasting up to 1 hour; overall burden improving - Started on IV amiodarone  bolus and infusion 6/25 - Recommend K > 4 as below - Continue IV amiodarone  with plan to transition to oral amiodarone  once mental status is improved  HFmrEF - Echo this admission with EF 45-50% - Started on IV Lasix  40 mg every 8 hours yesterday; agree with continuing this - Remains volume overloaded - Continue to monitor kidney function, strict I/Os, and daily weights with ongoing diuresis - Continued on metoprolol  succinate 12.5 mg daily - Hypotension limits escalation of GDMT  Encephalopathy - Mental status improving - In the setting of UTI, sacral decub ulcer, septic picture - EEG with moderate diffuse encephalopathy, MRI brain completed with motion artifact  Sepsis due to UTI - Presenting with tachycardia, tachypnea, leukocytosis, and lactic acidosis - Ongoing management per IM  History of DVT/PE - High risk for recurrence, PTA Eliquis . However, patient is unable to tolerate pills at this time - Continue IV heparin   Hypokalemia - Recommend repletion for K > 4  For questions or updates, please contact [facility] Please consult [email or web address] for contact info under     Signed, Lesley LITTIE Maffucci, PA-C  07/12/2023, 10:10 AM

## 2023-07-12 NOTE — Evaluation (Signed)
 Clinical/Bedside Swallow Evaluation Patient Details  Name: Wendy Sanchez MRN: [id number] Date of Birth: [birth year]  Today's Date: 07/12/2023 Time: SLP Start Time (ACUTE ONLY): 1145 SLP Stop Time (ACUTE ONLY): 1245 SLP Time Calculation (min) (ACUTE ONLY): 60 min  Past Medical History:  Past Medical History:  Diagnosis Date   Allergic rhinitis    Arthritis    Asthma    problems with fumes and aerosols cause asthma   Chest pain 10/19/2016   CHF (congestive heart failure) (HCC)    Complication of anesthesia    awakens during surgery; has occurred with last 3-4 surgeries    COPD (chronic obstructive pulmonary disease) (HCC)    DVT (deep venous thrombosis) (HCC) 07/13/2018   Right leg   Environmental allergies    fumes    Fibromyalgia    GERD (gastroesophageal reflux disease)    Headache    History of bronchitis    Hx of total knee arthroplasty 12/13/2015   Hyperlipidemia    Hypertension    Morbid obesity with BMI of 45.0-49.9, adult (HCC) 07/25/2015   NICM (nonischemic cardiomyopathy) (HCC)    a. ? PVC mediated;  b. 06/2016 Echo: EF 25-30%, mild LVH, mild LAE, mild AI/MR/TR/PR; c. 08/2016 Cath: nl cors, EF 25%.   Numbness    hands bilat when driving; improves when not preforming task    Osteoarthritis of left knee 08/02/2015   PE (pulmonary thromboembolism) (HCC) 07/13/2018   Pes anserinus bursitis of left knee 05/18/2016   Presence of right artificial knee joint 05/18/2016   PVC (premature ventricular contraction) 06/04/2017   PVC's (premature ventricular contractions)    a. 11/2016 Amio started;  b. 11/29/16 48h Holter: 57574 PVC's (41%); c. 12/2016 24h Holter: 18040 PVC's (15%); c. 02/2017 48h Holter: 37262 PVC's (41%).   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   Temporal arteritis (HCC) 05/01/2020   Tinnitus    comes and goes    Unilateral  primary osteoarthritis, right knee 12/13/2015   Vertigo    none for over 2 yrs   Past Surgical History:  Past Surgical History:  Procedure Laterality Date   ABDOMINAL HYSTERECTOMY  1979   left ovary remains   ABDOMINAL HYSTERECTOMY     ARTERY BIOPSY Left 06/07/2019   Procedure: BIOPSY TEMPORAL ARTERY;  Surgeon: Marea Selinda RAMAN, MD;  Location: [facility];  Service: Vascular;  Laterality: Left;   CHOLECYSTECTOMY     COLON SURGERY     COLONOSCOPY     COLONOSCOPY N/A 10/30/2019   Procedure: COLONOSCOPY;  Surgeon: Maryruth Ole ONEIDA, MD;  Location: [facility];  Service: Endoscopy;  Laterality: N/A;   ESOPHAGOGASTRODUODENOSCOPY N/A 10/30/2019   Procedure: ESOPHAGOGASTRODUODENOSCOPY (EGD);  Surgeon: Maryruth Ole ONEIDA, MD;  Location: [facility];  Service: Endoscopy;  Laterality: N/A;   fibrous tissue removed from right shoulder and back of neck      2 years ago    FLOOR OF MOUTH BIOPSY N/A 02/28/2019   Procedure: SALIVARY GLAND BIOPSY;  Surgeon: Milissa Hamming, MD;  Location: [facility] OR[facility];  Service: ENT;  Laterality: N/A;   FRACTURE SURGERY     left small finger   GANGLION CYST EXCISION     GASTRIC BYPASS  1980   states had bypass with banding and band left in -   GASTRIC BYPASS OPEN     JOINT REPLACEMENT Bilateral 2017  knees   KNEE SURGERY Bilateral    both knees  2001   LAPAROSCOPIC SIGMOID COLECTOMY Left 09/13/2020   at [facility] for a perforated sigmoid diverticulitis   NECK SURGERY N/A 1205/19   ganglion cyst removal, benigh    PARTIAL COLECTOMY  09/2020   PULMONARY THROMBECTOMY Bilateral 07/14/2018   Procedure: PULMONARY THROMBECTOMY;  Surgeon: Marea Selinda RAMAN, MD;  Location: [facility] IN[facility];  Service: Cardiovascular;  Laterality: Bilateral;   PVC ABLATION N/A 06/04/2017   Procedure: PVC ABLATION;  Surgeon: Waddell Danelle ORN, MD;  Location: [facility] CV [facility];  Service: Cardiovascular;  Laterality: N/A;   RIGHT/LEFT HEART CATH AND CORONARY ANGIOGRAPHY Bilateral 08/12/2016    Procedure: Right/Left Heart Cath and Coronary Angiography;  Surgeon: Florencio Cara BIRCH, MD;  Location: [facility];  Service: Cardiovascular;  Laterality: Bilateral;   RIGHT/LEFT HEART CATH AND CORONARY ANGIOGRAPHY N/A 03/16/2023   Procedure: RIGHT/LEFT HEART CATH AND CORONARY ANGIOGRAPHY;  Surgeon: Rolan Ezra RAMAN, MD;  Location: [facility];  Service: Cardiovascular;  Laterality: N/A;   TONSILLECTOMY     age 71   TOTAL KNEE ARTHROPLASTY Left 08/02/2015   Procedure: LEFT TOTAL KNEE ARTHROPLASTY;  Surgeon: Lonni CINDERELLA Poli, MD;  Location: [facility];  Service: Orthopedics;  Laterality: Left;   TOTAL KNEE ARTHROPLASTY Right 12/13/2015   Procedure: RIGHT TOTAL KNEE ARTHROPLASTY;  Surgeon: Lonni CINDERELLA Poli, MD;  Location: [facility];  Service: Orthopedics;  Laterality: Right;   HPI:  Pt is a 73 y.o. African-American female with medical history significant for asthma, osteoarthritis, giant cell arthritis, combined systolic and diastolic CHF, GERD, COPD, DVT, PE and atrial fibrillation on Eliquis , Fibromyalgia, who presented to the emergency room with acute onset of increased weakness.  She was noted to have sediment in her Foley catheter that was changed yesterday however that continued today.  There was a concern about a sacral wound that felt to be tunneling deeper.  The patient had a brief syncopal episode in the ER when her family noted that she was mildly shaking and her eyes rolled back.  She did not appear to to be postictal when she was seen by the EDP.  No reported fever or chills.  No reported nausea or vomiting or abdominal pain.  No reported chest pain or palpitations.  No cough or wheezing or dyspnea.  No bleeding diathesis.  The patient has been getting twice a week dressing change for her sacral decubitus ulcer.  She was admitted to the Hospitalist service with a diagnosis of sepsis due to gram-negative UTI (met sepsis criteria of leukocytosis, tachycardia and tachypnea).  Wound care consult was obtained for a deep sacral decubitus ulcer. She was continued on her Eliquis  for her PAF. Vancomycin  was discontinued today due to MRSA screen being negative.  At her baseline, she is bedbound with chronic ankle contractures (plantar flexed) and has a colostomy as well as a foley catheter at home.  Post admit, the patient was alert and oriented x 3. Today, she was noted to have some tremors involving the right face and legs as well as bilateral upper extremities. EEG was obtained that did not show any epileptiform waves, but was consistent with an encephalopathy.  CT head (6/19): Generalized cerebral atrophy and microvascular disease changes of the supratentorial brain. No acute intracranial abnormality.  - MRI brain w/wo contrast (personally reviewed): Moderate to severely motion limited study without evidence of obvious acute abnormality. No abnormal enhancement is seen. No temporal lobe signal abnormalities.  Both Neurology and ID are following d/t pt's worsening enceophalopathy requiring a brief admit to the CCU.  MD assessment includes severe sepsis due to gram-negative UTI, acute metabolic encephalopathy, hypokalemia, and peripheral neuropathy.   CXR: Persistent pulmonary vascular congestion with possible mild  interstitial edema.  2. Similar dense left basilar opacity, favored to reflect  atelectasis.    Assessment / Plan / Recommendation  Clinical Impression   Pt seen for BSE today. Pt awake, verbal w/ decreased volume/mumbled speech d/t reduced effort. Few responses to direct, basic questions re: self overall. Pt appeared severely weak and deconditioned. NGT present w/ TFs ongoing.  On [place] O2 2L; afebrile. WBC not elevated.   Pt appears to present w/ grossly functional oropharyngeal phase swallowing w/ a modified diet consistency (Puree, Nectar liquids) presented in setting of decreased Stamina and effort. No oropharyngeal phase dysphagia noted w/ the consistencies given.  Pt consumed po trials w/ No immediate, overt clinical s/s of aspiration during po trials.  Pt appears at risk for aspiration/aspiration pneumonia in setting of chronic, lengthy illness and acute hospitalization/illness this admit. Other trial consistencies were hold on today and will be assessed accordingly. When following general aspiration precautions w/ a modified diet consistency initiated today, this risk appears reduced. However, pt does have challenging factors that could impact her oropharyngeal swallowing to include fatigue/weakness/deconditioning, quick exertion w/ any tasks, and need for feeding support w/ po's. These factors can increase risk for aspiration, dysphagia as well as decreased oral intake overall.   During po trials given, pt consumed consistencies w/ no overt coughing, decline in vocal quality, or change in respiratory presentation during/post trials. O2 sats remained 96-97%. Oral phase appeared grossly [facility] w/ timely bolus management and control of bolus propulsion for A-P transfer for swallowing. Oral clearing achieved w/ all trial consistencies. OM Exam appeared grossly [facility] w/ no unilateral weakness noted, however min decline in sustained strength tasks noted. Speech intelligibility reduced in setting of deconditioning/weakness/illness. Pt required fully feeding support w/ Rest Breaks d/t fatigue and exertion of po tasks.   Recommend initiation of a Dysphagia level 1 (puree) diet w/ Nectar consistency liquids by TSP, small cup sip monitored carefully. Recommend general aspiration precautions, full feeding assistance w/ Small bites/sips Slowly. Rest Breaks when needed. Pills Crushed in Puree for safer, easier swallowing -- it is encouraged now and for D/C.  Education given on Pills in Puree; food consistencies; aspiration precautions to pt and NSG. ST services will f/u w/ ongoing assessment of swallowing and toleration for diet upgrade as pt continues to heal and gain strength/Stamina  for tasks. NSG updated, agreed. MD updated. Recommend Dietician and Palliative Care f/u for support. Precautions posted in chart, room. SLP Visit Diagnosis: Dysphagia, unspecified (R13.10) (severely deconditioned, weakness)    Aspiration Risk  Mild aspiration risk;Risk for inadequate nutrition/hydration    Diet Recommendation   Nectar;Dysphagia 1 (puree) = a Dysphagia level 1 (puree) diet w/ Nectar consistency liquids by TSP, small cup sip monitored carefully. Recommend general aspiration precautions, full feeding assistance w/ Small bites/sips Slowly. Rest Breaks when needed.   Medication Administration: Crushed with puree    Other  Recommendations Recommended Consults:  (Dietician f/u; Palliative Care for GOC) Oral Care Recommendations: Oral care BID;Staff/trained caregiver to provide oral care Caregiver Recommendations: Avoid jello, ice cream, thin soups, popsicles;Remove water  pitcher;Have oral suction available     Assistance Recommended at Discharge  full  Functional Status Assessment Patient has had a recent decline in their functional status and demonstrates  the ability to make significant improvements in function in a reasonable and predictable amount of time.  Frequency and Duration min 2x/week  2 weeks       Prognosis Prognosis for improved oropharyngeal function: Fair Barriers to Reach Goals: Time post onset;Severity of deficits Barriers/Prognosis Comment: severe deconditioning, weakness      Swallow Study   General Date of Onset: 07/01/23 HPI: Pt is a 73 y.o. African-American female with medical history significant for asthma, osteoarthritis, giant cell arthritis, combined systolic and diastolic CHF, GERD, COPD, DVT, PE and atrial fibrillation on Eliquis , who presented to the emergency room with acute onset of increased weakness.  She was noted to have sediment in her Foley catheter that was changed yesterday however that continued today.  There was a concern about a sacral  wound that felt to be tunneling deeper.  The patient had a brief syncopal episode in the ER when her family noted that she was mildly shaking and her eyes rolled back.  She did not appear to to be postictal when she was seen by the EDP.  No reported fever or chills.  No reported nausea or vomiting or abdominal pain.  No reported chest pain or palpitations.  No cough or wheezing or dyspnea.  No bleeding diathesis.  The patient has been getting twice a week dressing change for her sacral decubitus ulcer.  She was admitted to the Hospitalist service with a diagnosis of sepsis due to gram-negative UTI (met sepsis criteria of leukocytosis, tachycardia and tachypnea). Wound care consult was obtained for a deep sacral decubitus ulcer. She was continued on her Eliquis  for her PAF. Vancomycin  was discontinued today due to MRSA screen being negative.  At her baseline, she is bedbound with chronic ankle contractures (plantar flexed) and has a colostomy as well as a foley catheter at home.  Post admit, the patient was alert and oriented x 3. Today, she was noted to have some tremors involving the right face and legs as well as bilateral upper extremities. EEG was obtained that did not show any epileptiform waves, but was consistent with an encephalopathy.  CT head (6/19): Generalized cerebral atrophy and microvascular disease changes of the supratentorial brain. No acute intracranial abnormality.  - MRI brain w/wo contrast (personally reviewed): Moderate to severely motion limited study without evidence of obvious acute abnormality. No abnormal enhancement is seen. No temporal lobe signal abnormalities.    Both Neurology and ID are following d/t pt's worsening enceophalopathy requiring a brief admit to the CCU.  MD assessment includes severe sepsis due to gram-negative UTI, acute metabolic encephalopathy, hypokalemia, and peripheral neuropathy.  CXR: Persistent pulmonary vascular congestion with possible mild  interstitial  edema.  2. Similar dense left basilar opacity, favored to reflect  atelectasis. Type of Study: Bedside Swallow Evaluation Previous Swallow Assessment: 01/2023- mehc soft, thins Diet Prior to this Study: NPO;Cortrak/Small bore NG tube Temperature Spikes Noted: No (wbc 9.1) Respiratory Status: Nasal cannula (2L) History of Recent Intubation: No Behavior/Cognition: Alert;Cooperative;Pleasant mood;Distractible;Requires cueing (severely deconditioned/weak) Oral Cavity Assessment: Within Functional Limits Oral Care Completed by SLP: Yes Oral Cavity - Dentition: Missing dentition (missing all teeth but few front lower ones) Vision:  (n/a) Self-Feeding Abilities: Total assist Patient Positioning: Upright in bed (full support) Baseline Vocal Quality: Low vocal intensity (mumbled speech- reduced effort) Volitional Cough: Weak Volitional Swallow: Able to elicit    Oral/Motor/Sensory Function Overall Oral Motor/Sensory Function: Generalized oral weakness (no unilateral weakness noted) Facial ROM: Within Functional Limits Facial Symmetry: Within Functional  Limits Lingual ROM: Within Functional Limits Lingual Symmetry: Within Functional Limits Lingual Strength: Reduced (slight-min during sustained protrusion; posterior strength grossly WFL) Velum: Within Functional Limits Mandible: Within Functional Limits   Ice Chips Ice chips: Within functional limits Presentation: Spoon (fed; 5 trials)   Thin Liquid Thin Liquid: Not tested    Nectar Thick Nectar Thick Liquid: Within functional limits Presentation: Spoon (fed; ~2 ozs)   Honey Thick Honey Thick Liquid: Not tested   Puree Puree: Within functional limits Presentation: Spoon (fed; 10 trials) Other Comments: 1/2 tsp amounts   Solid     Solid: Not tested         Comer Portugal, MS, CCC-SLP Speech Language Pathologist Rehab Services; [facility] [place] [phone] (ascom) Keeshia Sanderlin 07/12/2023,3:35 PM

## 2023-07-12 NOTE — Evaluation (Signed)
 Occupational Therapy Evaluation Patient Details Name: Wendy Sanchez MRN: [id number] DOB: 1950-12-24 Today's Date: 07/12/2023   History of Present Illness   Pt is a 73 y.o. female with medical history significant for asthma, osteoarthritis, giant cell arthritis, combined systolic and diastolic CHF, COPD, DVT, PE and atrial fibrillation on Eliquis , who presented to the emergency room with acute onset of increased weakness. MD assessment includes severe sepsis due to gram-negative UTI, acute metabolic encephalopathy, hypokalemia, and peripheral neuropathy.     Clinical Impressions Pt was seen for OT evaluation this date. PTA, pt resides at home where her sister provides 24/7 care. Per sister, pt is mostly bedbound, but was working with [facility] therapy and able to sit at EOB and SPT to W/C with x2 assist and RW use. Pt had limited sitting tolerance to ~20-30 mins d/t her sacral wound. Sister provides assist for all ADLs, but pt could feed self, operate TV remote and perform grooming tasks with setup assist.  Pt presents to acute OT demonstrating impaired ADL performance and functional mobility 2/2 weakness, low activity tolerance, balance deficits. Pt currently requires total to Max A x2-3 for all rolling in bed for wound care from nurse and for best positioning and pressure relief to promote wound healing. Pt with R lateral lean of neck in bed, placed pillows and rolled towels for better alignment. Pt fatigues very easily with activity, sp02 and HR stable. Pt is currently total assist with all ADLs. She has increased weakness in all extremities with very limited grip strength. She is alert, but lethargic throughout session and able to follow commands.  Pt would benefit from skilled OT services to address noted impairments and functional limitations to maximize safety and independence while minimizing falls risk and caregiver burden. Do anticipate the need for follow up OT services upon acute hospital  DC.      If plan is discharge home, recommend the following:   Two people to help with walking and/or transfers;Two people to help with bathing/dressing/bathroom     Functional Status Assessment   Patient has had a recent decline in their functional status and demonstrates the ability to make significant improvements in function in a reasonable and predictable amount of time.     Equipment Recommendations   Other (comment) (defer to next venue)     Recommendations for Other Services         Precautions/Restrictions   Precautions Precautions: Fall Recall of Precautions/Restrictions: Impaired Restrictions Weight Bearing Restrictions Per Provider Order: No Other Position/Activity Restrictions: colostomy, HOB to 30 deg, NG tube     Mobility Bed Mobility Overal bed mobility: Needs Assistance Bed Mobility: Rolling Rolling: Total assist, Max assist, +2 for physical assistance         General bed mobility comments: max A x2-3 to roll to bil sides in bed for nurse to address sacral wound as well as to place pillows for pressure relief; session focued on best positioning as pt tends to lean to the R side, propped pillows and towels for better alignment    Transfers                   General transfer comment: not assessed d/t unsafe with pt weakness      Balance  ADL either performed or assessed with clinical judgement   ADL Overall ADL's : Needs assistance/impaired                                       General ADL Comments: anticipate total assist for all ADLs at this time d/t increased weakness and fatigue with little activity     Vision         Perception         Praxis         Pertinent Vitals/Pain Pain Assessment Pain Assessment: Faces Faces Pain Scale: Hurts a little bit Pain Location: buttocks Pain Descriptors / Indicators: Sore, Tender Pain  Intervention(s): Premedicated before session, Repositioned     Extremity/Trunk Assessment Upper Extremity Assessment Upper Extremity Assessment: Generalized weakness;Right hand dominant (very weak, able to flex and extend elbow, unable to perform AROM at wrist or shoulder d/t weakness and RTC tear on R shoulder, 2+/5 grip on R hand)   Lower Extremity Assessment Lower Extremity Assessment: Generalized weakness       Communication Communication Communication: No apparent difficulties (low voice, very weak)   Cognition Arousal: Lethargic Behavior During Therapy: WFL for tasks assessed/performed                                 Following commands: Intact       Cueing  General Comments   Cueing Techniques: Verbal cues;Gestural cues;Tactile cues  sp02 and HR stable throughout session although pt with increased WOB with bed mobility tasks; sister present and very active her in care-24/7 at home   Exercises Other Exercises Other Exercises: Edu on role of OT in acute setting   Shoulder Instructions      Home Living Family/patient expects to be discharged to:: Private residence Living Arrangements: Alone Available Help at Discharge: Family;Available 24 hours/day Type of Home: House Home Access: Ramped entrance     Home Layout: One level     Bathroom Shower/Tub: Sponge bathes at baseline   Bathroom Toilet: Handicapped height     Home Equipment: BSC/3in1;Hospital bed;Rollator (4 wheels)          Prior Functioning/Environment Prior Level of Function : Needs assist       Physical Assist : Mobility (physical);ADLs (physical) Mobility (physical): Bed mobility;Transfers;Gait ADLs (physical): Feeding;Grooming;Bathing;Dressing;Toileting Mobility Comments: Pt's sister stated that pt is reliant on her to stand pivot towards [facility] for toileting or to get to W/C with x2 assist from [facility] therapist's. Pt's sister also stated that pt could only stand 2x/week for 1  minute at the most. Pt only tolerates W/C sitting ~20 mins d/t sacral wound ADLs Comments: Pt's sister stated that she assists pt with all basic ADLs    OT Problem List: Decreased strength;Impaired balance (sitting and/or standing);Decreased activity tolerance   OT Treatment/Interventions: Self-care/ADL training;Therapeutic exercise;Therapeutic activities;Energy conservation;DME and/or AE instruction;Patient/family education;Balance training      OT Goals(Current goals can be found in the care plan section)   Acute Rehab OT Goals Patient Stated Goal: improve strength and function OT Goal Formulation: With patient/family Time For Goal Achievement: 07/26/23 Potential to Achieve Goals: Fair ADL Goals Pt Will Perform Grooming: with min assist;bed level Pt Will Perform Upper Body Bathing: with min assist;bed level Pt/caregiver will Perform Home Exercise Program: Increased strength;Increased ROM;Both right and left upper extremity;With Supervision   OT Frequency:  Min 2X/week    Co-evaluation              AM-PAC OT 6 Clicks Daily Activity     Outcome Measure Help from another person eating meals?: Total Help from another person taking care of personal grooming?: Total Help from another person toileting, which includes using toliet, bedpan, or urinal?: Total Help from another person bathing (including washing, rinsing, drying)?: Total Help from another person to put on and taking off regular upper body clothing?: Total Help from another person to put on and taking off regular lower body clothing?: Total 6 Click Score: 6   End of Session Equipment Utilized During Treatment: Oxygen Nurse Communication: Mobility status  Activity Tolerance: Patient limited by fatigue;Patient tolerated treatment well Patient left: in bed;with call bell/phone within reach;with bed alarm set;with family/visitor present  OT Visit Diagnosis: Other abnormalities of gait and mobility (R26.89);Muscle  weakness (generalized) (M62.81)                Time: 1031-1100 OT Time Calculation (min): 29 min Charges:  OT General Charges $OT Visit: 1 Visit OT Evaluation $OT Eval Low Complexity: 1 Low OT Treatments $Therapeutic Activity: 8-22 mins  Juniper Snyders, OTR/L 07/12/23, 11:16 AM  Gizell Danser E Ryland Tungate 07/12/2023, 11:11 AM

## 2023-07-12 NOTE — Progress Notes (Signed)
 PHARMACY - ANTICOAGULATION CONSULT NOTE  Pharmacy Consult for heparin  infusion Indication: h/o DVT & PE  Allergies  Allergen Reactions   Cefepime  Other (See Comments)    Encephalopathy requiring transfer to ICU on 07/05/2023 (not allergy so may use other cephalosporins and beta lactams).     Hydrocodone  Swelling   Hydrocodone -Acetaminophen  Swelling    hands   Iodinated Contrast Media Anaphylaxis   Iodine Anaphylaxis and Swelling    Iv dye  02/03/21-Ispoke to patient and she had IV contrast  X4 last year and has had no problem. ( Last CT abdomen with contrast was on 01/02/21.-   Nitrofurantoin    Other Other (See Comments)    ALLERGY TO METAL - BLACKENS SKIN AND CAUSES A RASH   Tape Swelling and Other (See Comments)    Skin comes off.  Paper tape is ok tegaderm OK   Shellfish-Derived Products    Wound Dressing Adhesive    Peanut Oil Other (See Comments)    ** Nuts cause runny nose   Shellfish Allergy Nausea And Vomiting and Swelling    Episode of GI infection after eating clam chowder. Still eats shrimp and other seafood    Patient Measurements: Height: 5' 8 (172.7 cm) Weight: 122.3 kg (269 lb 10 oz) IBW/kg (Calculated) : 63.9 Heparin  Dosing Weight: 91.1 kg   Vital Signs: Temp: 98.5 F (36.9 C) (06/29 2347) Temp Source: Axillary (06/29 1923) BP: 109/80 (06/30 0143) Pulse Rate: 83 (06/29 2347)  Labs: Recent Labs    07/09/23 0530 07/09/23 1412 07/10/23 0459 07/10/23 1452 07/10/23 2332 07/11/23 0538 07/11/23 1452 07/12/23 0032  HGB 8.4*  --  8.8*  --   --  8.3*  --   --   HCT 24.6*  --  24.9*  --   --  24.0*  --   --   PLT 490*  --  485*  --   --  412*  --   --   APTT 102*   < > 75*   < > 93* 108* 59*  --   HEPARINUNFRC 0.29*  --  0.18*   < > 0.27*  --  0.20* 0.39  CREATININE 1.05*  --  1.13*  --   --  1.14*  --   --    < > = values in this interval not displayed.    Estimated Creatinine Clearance: 61.5 mL/min (A) (by C-G formula based on SCr of 1.14 mg/dL  (H)).   Medical History: Past Medical History:  Diagnosis Date   Allergic rhinitis    Arthritis    Asthma    problems with fumes and aerosols cause asthma   Chest pain 10/19/2016   CHF (congestive heart failure) (HCC)    Complication of anesthesia    awakens during surgery; has occurred with last 3-4 surgeries    COPD (chronic obstructive pulmonary disease) (HCC)    DVT (deep venous thrombosis) (HCC) 07/13/2018   Right leg   Environmental allergies    fumes    Fibromyalgia    GERD (gastroesophageal reflux disease)    Headache    History of bronchitis    Hx of total knee arthroplasty 12/13/2015   Hyperlipidemia    Hypertension    Morbid obesity with BMI of 45.0-49.9, adult (HCC) 07/25/2015   NICM (nonischemic cardiomyopathy) (HCC)    a. ? PVC mediated;  b. 06/2016 Echo: EF 25-30%, mild LVH, mild LAE, mild AI/MR/TR/PR; c. 08/2016 Cath: nl cors, EF 25%.   Numbness  hands bilat when driving; improves when not preforming task    Osteoarthritis of left knee 08/02/2015   PE (pulmonary thromboembolism) (HCC) 07/13/2018   Pes anserinus bursitis of left knee 05/18/2016   Presence of right artificial knee joint 05/18/2016   PVC (premature ventricular contraction) 06/04/2017   PVC's (premature ventricular contractions)    a. 11/2016 Amio started;  b. 11/29/16 48h Holter: 57574 PVC's (41%); c. 12/2016 24h Holter: 18040 PVC's (15%); c. 02/2017 48h Holter: 37262 PVC's (41%).   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   Temporal arteritis (HCC) 05/01/2020   Tinnitus    comes and goes    Unilateral primary osteoarthritis, right knee 12/13/2015   Vertigo    none for over 2 yrs    Medications:  Scheduled:   vitamin C   500 mg Per Tube BID   calcium  carbonate  1 tablet Per Tube TID WC   Chlorhexidine  Gluconate Cloth  6 each Topical Daily   copper   2 mg Per  Tube Daily   feeding supplement (PROSource TF20)  60 mL Per Tube Daily   free water   30 mL Per Tube Q4H   furosemide   40 mg Intravenous Q8H   gabapentin   100 mg Per Tube Q8H   hydrocortisone  sod succinate (SOLU-CORTEF ) inj  50 mg Intravenous Q12H   levETIRAcetam   1,000 mg Intravenous Q12H   liver oil-zinc  oxide   Topical BID   metoprolol  succinate  12.5 mg Oral Daily   multivitamin with minerals  1 tablet Per Tube BID   nystatin  cream   Topical BID   mouth rinse  15 mL Mouth Rinse 4 times per day   pantoprazole  (PROTONIX ) IV  40 mg Intravenous QHS   QUEtiapine   25 mg Oral QHS   thiamine   100 mg Per Tube Daily   vancomycin  variable dose per unstable renal function (pharmacist dosing)   Does not apply See admin instructions   zinc  sulfate (50mg  elemental zinc )  220 mg Per Tube Daily   Infusions:   acyclovir  880 mg (07/11/23 2236)   amiodarone  30 mg/hr (07/11/23 2343)   feeding supplement (OSMOLITE 1.5 CAL) 1,000 mL (07/10/23 1629)   heparin  1,150 Units/hr (07/11/23 1642)   meropenem  (MERREM ) IV 2 g (07/11/23 2142)   promethazine  (PHENERGAN ) injection (IM or IVPB)      Assessment: 73 y.o. African-American female with medical history significant for asthma, osteoarthritis, giant cell arthritis, combined systolic and diastolic CHF, COPD, DVT, PE and atrial fibrillation on Eliquis , who presented to the emergency room with acute onset of increased weakness. Last dose of apixaban  07/05/23 0919   Goal of Therapy:  anti-Xa level 0.3-0.7 units/ml aPTT 66 - 102 seconds Monitor platelets by anticoagulation protocol: Yes  06/25 2314 aPTT 161 (verbal), supratherapeutic 06/26 1802 aPTT 96 06/27 0530 aPTT 102, borderline supratherapeutic 06/28 0459 aPTT 75, therapeutic / HL 0.18, subtherapeutic 06/28 1452 aPTT 110/HL0.33 06/28 2332 aPTT 93, therapeutic x 1 / HL 0.27 06/29 0538 aPTT 108, supratherapeutic 06/29 1452 aPTT 59/HL 0.20 06/30 0032 HL 0.39, therapeutic x 1   Plan:  ---Continue  heparin  infusion at 1150 units/hr  ---Recheck HL in 8 hrs to confirm  ---continue to monitor H&H and platelets.   Rankin CANDIE Dills, PharmD, MBA 07/12/2023 2:06 AM

## 2023-07-13 ENCOUNTER — Inpatient Hospital Stay

## 2023-07-13 DIAGNOSIS — R531 Weakness: Secondary | ICD-10-CM | POA: Diagnosis not present

## 2023-07-13 DIAGNOSIS — A4150 Gram-negative sepsis, unspecified: Secondary | ICD-10-CM | POA: Diagnosis not present

## 2023-07-13 DIAGNOSIS — I5043 Acute on chronic combined systolic (congestive) and diastolic (congestive) heart failure: Secondary | ICD-10-CM | POA: Diagnosis not present

## 2023-07-13 DIAGNOSIS — I480 Paroxysmal atrial fibrillation: Secondary | ICD-10-CM | POA: Diagnosis not present

## 2023-07-13 DIAGNOSIS — Z515 Encounter for palliative care: Secondary | ICD-10-CM

## 2023-07-13 DIAGNOSIS — G9341 Metabolic encephalopathy: Secondary | ICD-10-CM | POA: Diagnosis not present

## 2023-07-13 DIAGNOSIS — N390 Urinary tract infection, site not specified: Secondary | ICD-10-CM | POA: Diagnosis not present

## 2023-07-13 LAB — BASIC METABOLIC PANEL WITH GFR
Anion gap: 7 (ref 5–15)
Anion gap: 11 (ref 5–15)
BUN: 32 mg/dL — ABNORMAL HIGH (ref 8–23)
BUN: 34 mg/dL — ABNORMAL HIGH (ref 8–23)
CO2: 29 mmol/L (ref 22–32)
CO2: 27 mmol/L (ref 22–32)
Calcium: 8.1 mg/dL — ABNORMAL LOW (ref 8.9–10.3)
Calcium: 8.1 mg/dL — ABNORMAL LOW (ref 8.9–10.3)
Chloride: 110 mmol/L (ref 98–111)
Chloride: 108 mmol/L (ref 98–111)
Creatinine, Ser: 1.05 mg/dL — ABNORMAL HIGH (ref 0.44–1.00)
Creatinine, Ser: 1.05 mg/dL — ABNORMAL HIGH (ref 0.44–1.00)
GFR, Estimated: 56 mL/min — ABNORMAL LOW (ref >60)
GFR, Estimated: 56 mL/min — ABNORMAL LOW (ref >60)
Glucose, Bld: 138 mg/dL — ABNORMAL HIGH (ref 70–99)
Glucose, Bld: 193 mg/dL — ABNORMAL HIGH (ref 70–99)
Potassium: 3.8 mmol/L (ref 3.5–5.1)
Potassium: 3.3 mmol/L — ABNORMAL LOW (ref 3.5–5.1)
Sodium: 146 mmol/L — ABNORMAL HIGH (ref 135–145)
Sodium: 146 mmol/L — ABNORMAL HIGH (ref 135–145)

## 2023-07-13 LAB — BLOOD GAS, ARTERIAL
Acid-Base Excess: 3.7 mmol/L — ABNORMAL HIGH (ref 0.0–2.0)
Bicarbonate: 30.6 mmol/L — ABNORMAL HIGH (ref 20.0–28.0)
O2 Saturation: 99.9 %
Patient temperature: 37
pCO2 arterial: 58 mmHg — ABNORMAL HIGH (ref 32–48)
pH, Arterial: 7.33 — ABNORMAL LOW (ref 7.35–7.45)
pO2, Arterial: 153 mmHg — ABNORMAL HIGH (ref 83–108)

## 2023-07-13 LAB — GLUCOSE, CAPILLARY
Glucose-Capillary: 127 mg/dL — ABNORMAL HIGH (ref 70–99)
Glucose-Capillary: 136 mg/dL — ABNORMAL HIGH (ref 70–99)
Glucose-Capillary: 144 mg/dL — ABNORMAL HIGH (ref 70–99)
Glucose-Capillary: 146 mg/dL — ABNORMAL HIGH (ref 70–99)
Glucose-Capillary: 175 mg/dL — ABNORMAL HIGH (ref 70–99)
Glucose-Capillary: 183 mg/dL — ABNORMAL HIGH (ref 70–99)

## 2023-07-13 LAB — CBC WITH DIFFERENTIAL/PLATELET
Abs Immature Granulocytes: 0.32 10*3/uL — ABNORMAL HIGH (ref 0.00–0.07)
Basophils Absolute: 0 10*3/uL (ref 0.0–0.1)
Basophils Relative: 0 %
Eosinophils Absolute: 0 10*3/uL (ref 0.0–0.5)
Eosinophils Relative: 0 %
HCT: 28 % — ABNORMAL LOW (ref 36.0–46.0)
Hemoglobin: 9 g/dL — ABNORMAL LOW (ref 12.0–15.0)
Immature Granulocytes: 3 %
Lymphocytes Relative: 11 %
Lymphs Abs: 1.2 10*3/uL (ref 0.7–4.0)
MCH: 27.4 pg (ref 26.0–34.0)
MCHC: 32.1 g/dL (ref 30.0–36.0)
MCV: 85.4 fL (ref 80.0–100.0)
Monocytes Absolute: 0.9 10*3/uL (ref 0.1–1.0)
Monocytes Relative: 7 %
Neutro Abs: 9.4 10*3/uL — ABNORMAL HIGH (ref 1.7–7.7)
Neutrophils Relative %: 79 %
Platelets: 609 10*3/uL — ABNORMAL HIGH (ref 150–400)
RBC: 3.28 MIL/uL — ABNORMAL LOW (ref 3.87–5.11)
RDW: 19.9 % — ABNORMAL HIGH (ref 11.5–15.5)
Smear Review: NORMAL
WBC: 11.8 10*3/uL — ABNORMAL HIGH (ref 4.0–10.5)
nRBC: 4.5 % — ABNORMAL HIGH (ref 0.0–0.2)

## 2023-07-13 LAB — LACTIC ACID, PLASMA: Lactic Acid, Venous: 2.9 mmol/L (ref 0.5–1.9)

## 2023-07-13 LAB — CBC
HCT: 27.4 % — ABNORMAL LOW (ref 36.0–46.0)
Hemoglobin: 9 g/dL — ABNORMAL LOW (ref 12.0–15.0)
MCH: 26.9 pg (ref 26.0–34.0)
MCHC: 32.8 g/dL (ref 30.0–36.0)
MCV: 81.8 fL (ref 80.0–100.0)
Platelets: 518 10*3/uL — ABNORMAL HIGH (ref 150–400)
RBC: 3.35 MIL/uL — ABNORMAL LOW (ref 3.87–5.11)
RDW: 19.2 % — ABNORMAL HIGH (ref 11.5–15.5)
WBC: 9 10*3/uL (ref 4.0–10.5)
nRBC: 2.9 % — ABNORMAL HIGH (ref 0.0–0.2)

## 2023-07-13 LAB — MAGNESIUM: Magnesium: 2.2 mg/dL (ref 1.7–2.4)

## 2023-07-13 MED ORDER — MORPHINE 100MG IN NS 100ML (1MG/ML) PREMIX INFUSION
INTRAVENOUS | Status: AC
  Filled 2023-07-13: qty 100

## 2023-07-13 MED ORDER — MORPHINE BOLUS VIA INFUSION
1.0000 mg | INTRAVENOUS | Status: DC | PRN
  Administered 2023-07-13: 3 mg via INTRAVENOUS
  Administered 2023-07-14: 4 mg via INTRAVENOUS
  Administered 2023-07-14 (×2): 3 mg via INTRAVENOUS
  Administered 2023-07-14: 4 mg via INTRAVENOUS

## 2023-07-13 MED ORDER — METOLAZONE 5 MG PO TABS
5.0000 mg | ORAL_TABLET | Freq: Once | ORAL | Status: AC
  Administered 2023-07-13: 5 mg
  Filled 2023-07-13: qty 1

## 2023-07-13 MED ORDER — HALOPERIDOL LACTATE 5 MG/ML IJ SOLN
0.5000 mg | INTRAMUSCULAR | Status: DC | PRN
Start: 2023-07-13 — End: 2023-07-14
  Administered 2023-07-14: 0.5 mg via INTRAVENOUS
  Filled 2023-07-13 (×2): qty 1

## 2023-07-13 MED ORDER — MORPHINE 100MG IN NS 100ML (1MG/ML) PREMIX INFUSION
1.0000 mg/h | INTRAVENOUS | Status: DC
  Administered 2023-07-13: 3 mg/h via INTRAVENOUS
  Administered 2023-07-13: 2 mg/h via INTRAVENOUS
  Administered 2023-07-14: 4 mg/h via INTRAVENOUS
  Administered 2023-07-14: 5 mg/h via INTRAVENOUS

## 2023-07-13 MED ORDER — HALOPERIDOL LACTATE 2 MG/ML PO CONC: 0.5000 mg | ORAL | Status: DC | PRN

## 2023-07-13 MED ORDER — POTASSIUM CHLORIDE CRYS ER 20 MEQ PO TBCR: 40.0000 meq | EXTENDED_RELEASE_TABLET | Freq: Once | ORAL | Status: DC

## 2023-07-13 MED ORDER — FUROSEMIDE 10 MG/ML IJ SOLN
40.0000 mg | Freq: Two times a day (BID) | INTRAMUSCULAR | Status: DC
  Administered 2023-07-13: 40 mg via INTRAVENOUS
  Filled 2023-07-13: qty 4

## 2023-07-13 MED ORDER — HALOPERIDOL 0.5 MG PO TABS: 0.5000 mg | ORAL_TABLET | ORAL | Status: DC | PRN

## 2023-07-13 NOTE — NC FL2 (Signed)
 [place]  MEDICAID FL2 LEVEL OF CARE FORM     IDENTIFICATION  Patient Name: Wendy Sanchez Birthdate: 1950-08-18 Sex: female Admission Date (Current Location): 07/01/2023  [facility] and [place] Number:  Chiropodist and Address:  [facility], [address] [place], [place] [address]      Provider Number: [id number]  Attending Physician Name and Address:  Laurita Pillion, MD  Relative Name and Phone Number:  Sebastian Dirks (Sister)  [phone]    Current Level of Care: Hospital Recommended Level of Care: Skilled Nursing Facility Prior Approval Number:    Date Approved/Denied:   PASRR Number: [id number]  Discharge Plan: SNF    Current Diagnoses: Patient Active Problem List   Diagnosis Date Noted   Decreased cardiac ejection fraction 07/10/2023   Paroxysmal SVT (supraventricular tachycardia) (HCC) 07/10/2023   Acute metabolic encephalopathy 07/08/2023   Reactive thrombocytosis 07/07/2023   Adrenal crisis (HCC) 07/07/2023   Weakness 07/07/2023   Encephalopathy 07/07/2023   Sepsis due to gram-negative UTI (HCC) 07/01/2023   Syncope 07/01/2023   Paroxysmal atrial fibrillation (HCC) 07/01/2023   GERD without esophagitis 07/01/2023   Asthma-COPD overlap syndrome (HCC) 07/01/2023   Malnutrition of moderate degree 03/23/2023   RSV/bronchiolitis 03/21/2023   C. difficile colitis 03/21/2023   Abdominal pain 03/17/2023   Sepsis (HCC) 03/11/2023   Nonischemic cardiomyopathy (HCC) 03/11/2023   Acute on chronic combined systolic and diastolic CHF (congestive heart failure) (HCC) 03/11/2023   SVT (supraventricular tachycardia) (HCC) 03/11/2023   Pressure injury of skin 03/08/2023   Obesity (BMI 30-39.9) 03/07/2023   Myocardial injury 03/07/2023   Depression with anxiety 03/07/2023   Chronic obstructive pulmonary disease (COPD) (HCC) 01/30/2023   Lower urinary tract infection 01/30/2023   Otitis externa 01/30/2023   Hypotension  01/30/2023   Peripheral neuropathy 01/30/2023   Acute pharyngitis 01/30/2023   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   Protein-calorie malnutrition, moderate (HCC) 10/10/2022   History of pulmonary embolus 2020(PE) 04/07/2021   Acute diverticulitis 01/30/2021   Current chronic use of systemic steroids 01/30/2021   Chronic diastolic CHF (congestive heart failure) (HCC) 01/30/2021   Compression fracture of T2 vertebra (HCC) 01/30/2021   S/P PICC central line placement 01/30/2021   Sinus tachycardia 11/12/2020   COVID-19 virus infection 11/11/2020   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   AKI (acute kidney injury) (HCC) 11/09/2020   Pain in joint, shoulder region 07/03/2020   Giant cell arteritis (HCC) 05/01/2020   History of renal cell carcinoma 04/25/2020   HFrEF (heart failure with reduced ejection fraction) (HCC) 03/22/2020   Frontal headache 05/30/2019   Carcinoma, renal cell, left (HCC) 11/14/2018   Pulmonary embolism (HCC) 07/13/2018   DDD (degenerative disc disease), lumbar 06/30/2018   Chronic low back pain 06/30/2018   Hip pain, bilateral 06/30/2018   Chronic, continuous use of opioids 06/30/2018   PVC (premature ventricular contraction) 06/04/2017   Chest pain 10/19/2016   Presence of right artificial knee joint 05/18/2016   Pes anserinus bursitis of left knee 05/18/2016   Unilateral primary osteoarthritis, right knee 12/13/2015   Hx of total knee arthroplasty 12/13/2015   Osteoarthritis of left knee 08/02/2015   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   Morbid obesity with BMI of 45.0-49.9, adult (HCC) 07/25/2015   Spinal stenosis of lumbar region 06/19/2015   Chronic pain syndrome 10/30/2014    Orientation RESPIRATION BLADDER Height & Weight     Self, Time, Situation    Indwelling catheter Weight: 124 kg Height:  5'  8 (172.7 cm)  BEHAVIORAL SYMPTOMS/MOOD NEUROLOGICAL BOWEL NUTRITION STATUS      Colostomy    AMBULATORY STATUS COMMUNICATION  OF NEEDS Skin   Extensive Assist Verbally                         Personal Care Assistance Level of Assistance  Bathing, Feeding, Dressing Bathing Assistance: Maximum assistance Feeding assistance: Limited assistance Dressing Assistance: Maximum assistance     Functional Limitations Info             SPECIAL CARE FACTORS FREQUENCY                       Contractures      Additional Factors Info  Code Status, Allergies Code Status Info: DNR Allergies Info: Cefepime ,Hydrocodone ,Hydrocodone -acetaminophen , Iodinated Contrast Media, Iodine, Nitrofurantoin, METAL,Tape, Shellfish-derived Products ,Wound Dressing Adhesive, Peanut Oil, Nuts, Shellfish           Current Medications (07/13/2023):  This is the current hospital active medication list Current Facility-Administered Medications  Medication Dose Route Frequency Provider Last Rate Last Admin   acetaminophen  (TYLENOL ) tablet 650 mg  650 mg Oral Q6H PRN Zhang, Dekui, MD   650 mg at 07/12/23 1147   Or   acetaminophen  (TYLENOL ) suppository 650 mg  650 mg Rectal Q6H PRN Laurita Pillion, MD       albuterol  (PROVENTIL ) (2.5 MG/3ML) 0.083% nebulizer solution 2.5 mg  2.5 mg Nebulization Q6H PRN Mansy, Jan A, MD   2.5 mg at 07/11/23 0049   amiodarone  (NEXTERONE  PREMIX) 360-4.14 MG/200ML-% (1.8 mg/mL) IV infusion  30 mg/hr Intravenous Continuous Gollan, Timothy J, MD 16.67 mL/hr at 07/13/23 0800 30 mg/hr at 07/13/23 0800   apixaban  (ELIQUIS ) tablet 5 mg  5 mg Oral BID Zhang, Dekui, MD   5 mg at 07/13/23 9170   artificial tears ophthalmic solution 1 drop  1 drop Both Eyes PRN Mansy, Jan A, MD       ascorbic acid  (VITAMIN C ) tablet 500 mg  500 mg Per Tube BID Zhang, Dekui, MD   500 mg at 07/13/23 9170   calcium  carbonate (TUMS - dosed in mg elemental calcium ) chewable tablet 200 mg of elemental calcium   1 tablet Per Tube TID WC Zhang, Dekui, MD   200 mg of elemental calcium  at 07/13/23 0829   cefTAZidime (FORTAZ) 2 g in sodium  chloride 0.9 % 100 mL IVPB  2 g Intravenous Q8H Epifanio Alm SQUIBB, MD   Stopped at 07/13/23 0606   Chlorhexidine  Gluconate Cloth 2 % PADS 6 each  6 each Topical Daily Dorinda Drue DASEN, MD   6 each at 07/13/23 9170   copper  tablet 2 mg  2 mg Per Tube Daily Laurita Pillion, MD   2 mg at 07/13/23 9170   diphenhydrAMINE  (BENADRYL ) capsule 25 mg  25 mg Oral Q6H PRN Mansy, Jan A, MD       feeding supplement (OSMOLITE 1.5 CAL) liquid 1,000 mL  1,000 mL Per Tube Continuous Zhang, Dekui, MD 50 mL/hr at 07/13/23 0800 Infusion Verify at 07/13/23 0800   feeding supplement (PROSource TF20) liquid 60 mL  60 mL Per Tube Daily Laurita Pillion, MD   60 mL at 07/13/23 0829   free water  30 mL  30 mL Per Tube Q4H Laurita Pillion, MD   30 mL at 07/13/23 0830   furosemide  (LASIX ) injection 40 mg  40 mg Intravenous Q8H Zhang, Dekui, MD   40 mg at 07/13/23 [phone]  gabapentin  (NEURONTIN ) 250 MG/5ML solution 100 mg  100 mg Per Tube Q8H Lindzen, Eric, MD   100 mg at 07/13/23 0522   hydrocortisone  sodium succinate  (SOLU-CORTEF ) 100 MG injection 50 mg  50 mg Intravenous Q12H Laurita Pillion, MD   50 mg at 07/13/23 0522   HYDROmorphone  (DILAUDID ) injection 0.5 mg  0.5 mg Intravenous Q4H PRN Laurita Pillion, MD   0.5 mg at 07/11/23 1349   liver oil-zinc  oxide (DESITIN) 40 % ointment   Topical BID Mansy, Jan A, MD   Given at 07/13/23 0830   liver oil-zinc  oxide (DESITIN) 40 % ointment   Topical PRN Mansy, Jan A, MD       magnesium  hydroxide (MILK OF MAGNESIA) suspension 30 mL  30 mL Oral Daily PRN Mansy, Jan A, MD       melatonin tablet 5 mg  5 mg Oral QHS PRN Mansy, Jan A, MD       metoprolol  succinate (TOPROL -XL) 24 hr tablet 12.5 mg  12.5 mg Oral Daily Djan, Prince T, MD   12.5 mg at 07/12/23 0820   metoprolol  tartrate (LOPRESSOR ) injection 5 mg  5 mg Intravenous Q6H PRN Dorinda Drue DASEN, MD   5 mg at 07/13/23 9367   multivitamin with minerals tablet 1 tablet  1 tablet Per Tube BID Zhang, Dekui, MD   1 tablet at 07/13/23 0830   nystatin  cream  (MYCOSTATIN )   Topical BID Djan, Prince T, MD   Given at 07/13/23 0830   ondansetron  (ZOFRAN ) tablet 4 mg  4 mg Oral Q6H PRN Mansy, Jan A, MD       Or   ondansetron  (ZOFRAN ) injection 4 mg  4 mg Intravenous Q6H PRN Mansy, Jan A, MD   4 mg at 07/04/23 1223   Oral care mouth rinse  15 mL Mouth Rinse 4 times per day Laurita Pillion, MD   15 mL at 07/13/23 0830   Oral care mouth rinse  15 mL Mouth Rinse PRN Laurita Pillion, MD       oxyCODONE -acetaminophen  (PERCOCET/ROXICET) 5-325 MG per tablet 1 tablet  1 tablet Oral Q6H PRN Mansy, Jan A, MD   1 tablet at 07/13/23 9478   And   oxyCODONE  (Oxy IR/ROXICODONE ) immediate release tablet 5 mg  5 mg Oral Q6H PRN Mansy, Jan A, MD   5 mg at 07/13/23 9478   pantoprazole  (PROTONIX ) injection 40 mg  40 mg Intravenous QHS Grubb, Rodney D, RPH   40 mg at 07/12/23 2054   polyethylene glycol (MIRALAX  / GLYCOLAX ) packet 17 g  17 g Oral Daily PRN Mansy, Jan A, MD       promethazine  (PHENERGAN ) 12.5 mg in sodium chloride  0.9 % 50 mL IVPB  12.5 mg Intravenous Q6H PRN Dorinda Drue DASEN, MD       QUEtiapine  (SEROQUEL ) tablet 25 mg  25 mg Oral QHS Zhang, Dekui, MD   25 mg at 07/12/23 2056   thiamine  (VITAMIN B1) tablet 100 mg  100 mg Per Tube Daily Laurita Pillion, MD   100 mg at 07/13/23 9170   zinc  sulfate (50mg  elemental zinc ) capsule 220 mg  220 mg Per Tube Daily Laurita Pillion, MD   220 mg at 07/13/23 9170     Discharge Medications: Please see discharge summary for a list of discharge medications.  Relevant Imaging Results:  Relevant Lab Results:   Additional Information [id number]  Dalia GORMAN Fuse, RN

## 2023-07-13 NOTE — Progress Notes (Signed)
 INFECTIOUS DISEASE PROGRESS NOTE Date of Admission:  07/01/2023     ID: Wendy Sanchez is a 73 y.o. female with sepsis, ams Principal Problem:   Sepsis due to gram-negative UTI (HCC) Active Problems:   Chronic obstructive pulmonary disease (COPD) (HCC)   Protein-calorie malnutrition, moderate (HCC)   Lower urinary tract infection   Hypotension   Peripheral neuropathy   Acute on chronic combined systolic and diastolic CHF (congestive heart failure) (HCC)   Syncope   Paroxysmal atrial fibrillation (HCC)   GERD without esophagitis   Asthma-COPD overlap syndrome (HCC)   Reactive thrombocytosis   Adrenal crisis (HCC)   Weakness   Encephalopathy   Acute metabolic encephalopathy   Decreased cardiac ejection fraction   Paroxysmal SVT (supraventricular tachycardia) (HCC)   Subjective: Out of unit.  Much more interactive but still confused. Palliative care to see  No fevers.  No further clonus.  ROS  Unable to obtain  Medications:  Antibiotics Given (last 72 hours)     Date/Time Action Medication Dose Rate   07/10/23 2140 New Bag/Given   acyclovir  (ZOVIRAX ) 880 mg in dextrose  5 % 250 mL IVPB 880 mg 267.6 mL/hr   07/10/23 2343 New Bag/Given   meropenem  (MERREM ) 2 g in sodium chloride  0.9 % 100 mL IVPB 2 g 280 mL/hr   07/11/23 1059 New Bag/Given   meropenem  (MERREM ) 2 g in sodium chloride  0.9 % 100 mL IVPB 2 g 280 mL/hr   07/11/23 1101 New Bag/Given   acyclovir  (ZOVIRAX ) 880 mg in dextrose  5 % 250 mL IVPB 880 mg 267.6 mL/hr   07/11/23 2142 New Bag/Given   meropenem  (MERREM ) 2 g in sodium chloride  0.9 % 100 mL IVPB 2 g 280 mL/hr   07/11/23 2236 New Bag/Given   acyclovir  (ZOVIRAX ) 880 mg in dextrose  5 % 250 mL IVPB 880 mg 267.6 mL/hr   07/12/23 0955 New Bag/Given   meropenem  (MERREM ) 2 g in sodium chloride  0.9 % 100 mL IVPB 2 g 280 mL/hr   07/12/23 1047 New Bag/Given   acyclovir  (ZOVIRAX ) 880 mg in dextrose  5 % 250 mL IVPB 880 mg 267.6 mL/hr   07/12/23 2107 New Bag/Given    cefTAZidime (FORTAZ) 2 g in sodium chloride  0.9 % 100 mL IVPB 2 g 200 mL/hr   07/13/23 0531 Started During Downtime   cefTAZidime (FORTAZ) 2 g in sodium chloride  0.9 % 100 mL IVPB 2 g 200 mL/hr   07/13/23 1253 New Bag/Given   cefTAZidime (FORTAZ) 2 g in sodium chloride  0.9 % 100 mL IVPB 2 g 200 mL/hr       apixaban   5 mg Oral BID   vitamin C   500 mg Per Tube BID   calcium  carbonate  1 tablet Per Tube TID WC   Chlorhexidine  Gluconate Cloth  6 each Topical Daily   copper   2 mg Per Tube Daily   feeding supplement (PROSource TF20)  60 mL Per Tube Daily   free water   30 mL Per Tube Q4H   furosemide   40 mg Intravenous Q12H   gabapentin   100 mg Per Tube Q8H   hydrocortisone  sod succinate (SOLU-CORTEF ) inj  50 mg Intravenous Q12H   liver oil-zinc  oxide   Topical BID   metolazone   5 mg Per Tube Once   metoprolol  succinate  12.5 mg Oral Daily   multivitamin with minerals  1 tablet Per Tube BID   nystatin  cream   Topical BID   mouth rinse  15 mL Mouth Rinse 4 times  per day   pantoprazole  (PROTONIX ) IV  40 mg Intravenous QHS   potassium chloride   40 mEq Oral Once   QUEtiapine   25 mg Oral QHS   thiamine   100 mg Per Tube Daily   zinc  sulfate (50mg  elemental zinc )  220 mg Per Tube Daily    Objective: Vital signs in last 24 hours: Temp:  [97.5 F (36.4 C)-98.7 F (37.1 C)] 98.5 F (36.9 C) (07/01 1151) Pulse Rate:  [52-100] 99 (07/01 1151) Resp:  [18-20] 18 (07/01 0411) BP: (90-117)/(73-87) 112/76 (07/01 1151) SpO2:  [97 %-100 %] 97 % (07/01 1151) Weight:  [875 kg] 124 kg (07/01 0416)   Constitutional:  chronically ill appearing, sitting up in bed a little more responsive. Cardiovascular: Normal rate, regular rhythm and normal heart sounds.  Pulmonary/Chest: Effort normal and breath sounds normal. No respiratory distress.  has no wheezes.  Neck = supple, no nuchal rigidity Abdominal: Soft. Bowel sounds are normal.  exhibits no distension. There is no tenderness.  Lymphadenopathy: no  cervical adenopathy. No axillary adenopathy Neurological: confused, + myoclonus Skin:    Lab Results Recent Labs    07/12/23 0539 07/13/23 0314  WBC 9.1 9.0  HGB 9.1* 9.0*  HCT 27.8* 27.4*  NA 142 146*  K 3.7 3.8  CL 107 110  CO2 27 29  BUN 27* 32*  CREATININE 1.08* 1.05*    Microbiology: Results for orders placed or performed during the hospital encounter of 07/01/23  Blood culture (routine x 2)     Status: None   Collection Time: 07/01/23  4:45 PM   Specimen: BLOOD  Result Value Ref Range Status   Specimen Description BLOOD BLOOD RIGHT HAND  Final   Special Requests   Final    BOTTLES DRAWN AEROBIC AND ANAEROBIC Blood Culture results may not be optimal due to an inadequate volume of blood received in culture bottles   Culture   Final    NO GROWTH 5 DAYS Performed at [facility], [address] [place], [place] [address]    Report Status 07/06/2023 FINAL  Final  Blood culture (routine x 2)     Status: None   Collection Time: 07/01/23  4:45 PM   Specimen: BLOOD  Result Value Ref Range Status   Specimen Description BLOOD BLOOD LEFT ARM  Final   Special Requests   Final    BOTTLES DRAWN AEROBIC ONLY Blood Culture adequate volume   Culture   Final    NO GROWTH 5 DAYS Performed at [facility], [address], [place], [place] [address]    Report Status 07/06/2023 FINAL  Final  Culture, Urine (Do not remove urinary catheter, catheter placed by urology or difficult to place)     Status: Abnormal   Collection Time: 07/01/23  4:45 PM   Specimen: Urine, Catheterized  Result Value Ref Range Status   Specimen Description   Final    URINE, CATHETERIZED Performed at [facility], [address] [place], [place] [address]    Special Requests   Final    NONE Performed at [facility], [address] [place], [place] [address]    Culture (A)  Final    >=100,000 COLONIES/mL ESCHERICHIA COLI >=100,000 COLONIES/mL SERRATIA  MARCESCENS >=100,000 COLONIES/mL PSEUDOMONAS AERUGINOSA    Report Status 07/05/2023 FINAL  Final   Organism ID, Bacteria ESCHERICHIA COLI (A)  Final   Organism ID, Bacteria SERRATIA MARCESCENS (A)  Final   Organism ID, Bacteria PSEUDOMONAS AERUGINOSA (A)  Final      Susceptibility   Escherichia coli - MIC*    AMPICILLIN  16 INTERMEDIATE Intermediate     CEFAZOLIN  <=4 SENSITIVE Sensitive     CEFEPIME  <=0.12 SENSITIVE Sensitive     CEFTRIAXONE  <=0.25 SENSITIVE Sensitive     CIPROFLOXACIN >=4 RESISTANT Resistant     GENTAMICIN <=1 SENSITIVE Sensitive     IMIPENEM <=0.25 SENSITIVE Sensitive     NITROFURANTOIN <=16 SENSITIVE Sensitive     TRIMETH/SULFA <=20 SENSITIVE Sensitive     AMPICILLIN /SULBACTAM 4 SENSITIVE Sensitive     PIP/TAZO <=4 SENSITIVE Sensitive ug/mL    * >=100,000 COLONIES/mL ESCHERICHIA COLI   Pseudomonas aeruginosa - MIC*    CEFTAZIDIME 2 SENSITIVE Sensitive     CIPROFLOXACIN <=0.25 SENSITIVE Sensitive     GENTAMICIN <=1 SENSITIVE Sensitive     IMIPENEM <=0.25 SENSITIVE Sensitive     PIP/TAZO <=4 SENSITIVE Sensitive ug/mL    CEFEPIME  2 SENSITIVE Sensitive     * >=100,000 COLONIES/mL PSEUDOMONAS AERUGINOSA   Serratia marcescens - MIC*    CEFEPIME  <=0.12 SENSITIVE Sensitive     CEFTRIAXONE  <=0.25 SENSITIVE Sensitive     CIPROFLOXACIN <=0.25 SENSITIVE Sensitive     GENTAMICIN <=1 SENSITIVE Sensitive     NITROFURANTOIN 128 RESISTANT Resistant     TRIMETH/SULFA <=20 SENSITIVE Sensitive     * >=100,000 COLONIES/mL SERRATIA MARCESCENS  Aerobic/Anaerobic Culture w Gram Stain (surgical/deep wound)     Status: None   Collection Time: 07/02/23 12:38 AM   Specimen: Wound  Result Value Ref Range Status   Specimen Description   Final    WOUND Performed at [facility], [address] [place], [place] [address]    Special Requests   Final    NONE Performed at [facility], [address] [place], [place] [address]    Gram Stain   Final    FEW WBC  PRESENT, PREDOMINANTLY PMN NO ORGANISMS SEEN    Culture   Final    RARE PSEUDOMONAS AERUGINOSA MODERATE CORYNEBACTERIUM STRIATUM Standardized susceptibility testing for this organism is not available. RARE BACTEROIDES FRAGILIS BETA LACTAMASE POSITIVE Performed at [facility], [address], [place], [place] [address]    Report Status 07/05/2023 FINAL  Final   Organism ID, Bacteria PSEUDOMONAS AERUGINOSA  Final      Susceptibility   Pseudomonas aeruginosa - MIC*    CEFTAZIDIME 2 SENSITIVE Sensitive     CIPROFLOXACIN <=0.25 SENSITIVE Sensitive     GENTAMICIN <=1 SENSITIVE Sensitive     IMIPENEM <=0.25 SENSITIVE Sensitive     PIP/TAZO <=4 SENSITIVE Sensitive ug/mL    CEFEPIME  2 SENSITIVE Sensitive     * RARE PSEUDOMONAS AERUGINOSA  Respiratory (~20 pathogens) panel by PCR     Status: None   Collection Time: 07/02/23  2:47 PM   Specimen: Nasopharyngeal Swab; Respiratory  Result Value Ref Range Status   Adenovirus NOT DETECTED NOT DETECTED Final   Coronavirus 229E NOT DETECTED NOT DETECTED Final    Comment: (NOTE) The Coronavirus on the Respiratory Panel, DOES NOT test for the novel  Coronavirus (2019 nCoV)    Coronavirus HKU1 NOT DETECTED NOT DETECTED Final   Coronavirus NL63 NOT DETECTED NOT DETECTED Final   Coronavirus OC43 NOT DETECTED NOT DETECTED Final   Metapneumovirus NOT DETECTED NOT DETECTED Final   Rhinovirus / Enterovirus NOT DETECTED NOT DETECTED Final   Influenza A NOT DETECTED NOT DETECTED Final   Influenza B NOT DETECTED NOT DETECTED Final   Parainfluenza Virus 1  NOT DETECTED NOT DETECTED Final   Parainfluenza Virus 2 NOT DETECTED NOT DETECTED Final   Parainfluenza Virus 3 NOT DETECTED NOT DETECTED Final   Parainfluenza Virus 4 NOT DETECTED NOT DETECTED Final   Respiratory Syncytial Virus NOT DETECTED NOT DETECTED Final   Bordetella pertussis NOT DETECTED NOT DETECTED Final   Bordetella Parapertussis NOT DETECTED NOT DETECTED Final   Chlamydophila  pneumoniae NOT DETECTED NOT DETECTED Final   Mycoplasma pneumoniae NOT DETECTED NOT DETECTED Final    Comment: Performed at [facility], [address], [place], [place] [address]  Culture, blood (x 2)     Status: None   Collection Time: 07/06/23  2:46 AM   Specimen: BLOOD  Result Value Ref Range Status   Specimen Description BLOOD BLOOD LEFT ARM  Final   Special Requests   Final    BOTTLES DRAWN AEROBIC AND ANAEROBIC Blood Culture adequate volume   Culture   Final    NO GROWTH 5 DAYS Performed at [facility], [address] [place], [place] [address]    Report Status 07/11/2023 FINAL  Final  Culture, blood (x 2)     Status: None   Collection Time: 07/06/23  2:46 AM   Specimen: BLOOD  Result Value Ref Range Status   Specimen Description BLOOD BLOOD LEFT HAND  Final   Special Requests   Final    BOTTLES DRAWN AEROBIC AND ANAEROBIC Blood Culture adequate volume   Culture   Final    NO GROWTH 5 DAYS Performed at [facility], [address] [place], [place] [address]    Report Status 07/11/2023 FINAL  Final  MRSA Next Gen by PCR, Nasal     Status: None   Collection Time: 07/08/23 11:52 PM   Specimen: Nasal Mucosa; Nasal Swab  Result Value Ref Range Status   MRSA by PCR Next Gen NOT DETECTED NOT DETECTED Final    Comment: (NOTE) The GeneXpert MRSA Assay (FDA approved for NASAL specimens only), is one component of a comprehensive MRSA colonization surveillance program. It is not intended to diagnose MRSA infection nor to guide or monitor treatment for MRSA infections. Test performance is not FDA approved in patients less than 27 years old. Performed at [facility], [address], [place], [place] [address]     Studies/Results: No results found.   Assessment/Plan: SHANINA KEPPLE is a 73 y.o. female chronically quite ill woman who is bedbound and has contractures and a colostomy as well as a Foley admitted June 19 with acute onset of  weakness.  She has a history of asthma osteoarthritis giant cell arteritis CHF COPD DVT PE A-fib on Eliquis .  She has a chronic Foley and it was noted to have sediment in it.  She also has a sacral wound that was tunneling deeper.  In the ED she was hypotensive but alert.  She was afebrile.  She had had a Knieriem count of 12 normal renal function lactate of 1.7 anemia with hemoglobin of 9.3 elevated platelets.  Viral respiratory panel was negative.  Urinalysis showed 21-50 Dearinger cells.  Imaging initially showed CT of the head generalized cerebral atrophy but negative otherwise.  She was started on cefepime  and vancomycin  June 19.  She then had what appeared to be either a syncopal episode or seizure in the ED. She has been seen by neurology and is undergoing a lumbar puncture.  She had carotid Dopplers done At baseline she is bedbound has chronic ankle contractures  plantarflexion and has a colostomy as well as a Foley catheter.  She was at [employer] before coming home where her sister cares for her   Microdata reviewed and urine culture from her Foley catheter grew E. coli, Serratia and Pseudomonas.  These were all sensitive to cefepime  which she is continued on. I suspect the neuro changes are cefepime  toxicity. Doubt meningitis but LP cannot be done.    6/26- no fever, wbc down. Still encephalopathic and with some clonus  6/30-out of unit.  No longer with myoclonus.  7/1 - no fevers. Changed to ceftaz  Recommendations Changed meropenem  to ceftazidime for the wound and urine infection Can consider deescalate to oral cipro and Flagyl  when ready for discharge. I asked nurse to upload photos of wound with next wound change Will need aggressive wound care, pressure relief etc. for wound healing.  HX C diff at last admission no diarrhea.  Thank you very much for the consult. Will follow with you.  Alm SHAUNNA Needle   07/13/2023, 1:06 PM

## 2023-07-13 NOTE — Progress Notes (Signed)
 Rounding Note   Patient Name: Wendy Sanchez Date of Encounter: 07/13/2023  [facility] Cardiologist: Evalene Lunger, MD   Subjective Mental status continues to improve, although patient is difficult to understand. Telemetry shows paroxysms of atrial tachycardia and atrial fibrillation with rates up to 140s bpm. Kidney function is stable.   Scheduled Meds:  apixaban   5 mg Oral BID   vitamin C   500 mg Per Tube BID   calcium  carbonate  1 tablet Per Tube TID WC   Chlorhexidine  Gluconate Cloth  6 each Topical Daily   copper   2 mg Per Tube Daily   feeding supplement (PROSource TF20)  60 mL Per Tube Daily   free water   30 mL Per Tube Q4H   furosemide   40 mg Intravenous Q8H   gabapentin   100 mg Per Tube Q8H   hydrocortisone  sod succinate (SOLU-CORTEF ) inj  50 mg Intravenous Q12H   liver oil-zinc  oxide   Topical BID   metoprolol  succinate  12.5 mg Oral Daily   multivitamin with minerals  1 tablet Per Tube BID   nystatin  cream   Topical BID   mouth rinse  15 mL Mouth Rinse 4 times per day   pantoprazole  (PROTONIX ) IV  40 mg Intravenous QHS   QUEtiapine   25 mg Oral QHS   thiamine   100 mg Per Tube Daily   zinc  sulfate (50mg  elemental zinc )  220 mg Per Tube Daily   Continuous Infusions:  amiodarone  30 mg/hr (07/13/23 0800)   cefTAZidime (FORTAZ)  IV Stopped (07/13/23 0606)   feeding supplement (OSMOLITE 1.5 CAL) 50 mL/hr at 07/13/23 0800   promethazine  (PHENERGAN ) injection (IM or IVPB)     PRN Meds: acetaminophen  **OR** acetaminophen , albuterol , artificial tears, diphenhydrAMINE , HYDROmorphone  (DILAUDID ) injection, liver oil-zinc  oxide, magnesium  hydroxide, melatonin, metoprolol  tartrate, ondansetron  **OR** ondansetron  (ZOFRAN ) IV, mouth rinse, oxyCODONE -acetaminophen  **AND** oxyCODONE , polyethylene glycol, promethazine  (PHENERGAN ) injection (IM or IVPB)   Vital Signs  Vitals:   07/13/23 0416 07/13/23 0726 07/13/23 0727 07/13/23 0811  BP:  90/75  90/75  Pulse:  (!) 52 88    Resp:      Temp:  97.9 F (36.6 C)    TempSrc:  Oral    SpO2:  100% 100%   Weight: 124 kg     Height:        Intake/Output Summary (Last 24 hours) at 07/13/2023 1001 Last data filed at 07/13/2023 0800 Gross per 24 hour  Intake 1451.8 ml  Output 1875 ml  Net -423.2 ml      07/13/2023    4:16 AM 07/12/2023    5:00 AM 07/11/2023    4:57 AM  Last 3 Weights  Weight (lbs) 273 lb 5.9 oz 274 lb 7.6 oz 269 lb 10 oz  Weight (kg) 124 kg 124.5 kg 122.3 kg      Telemetry Sinus rhythm with PVCs, NSVT up to 7 beats; paroxysms of atrial tachycardia and atrial fibrillation with rates up to 140s bpm - Personally Reviewed  Physical Exam  GEN: Alert and in no acute distress Neck: Difficult to assess JVD Cardiac: RRR, no murmurs, rubs, or gallops.  Respiratory: Clear to auscultation bilaterally. GI: Soft, nontender, non-distended  MS: 1-2+ pitting edema; No deformity. Neuro:  Nonfocal  Psych: Normal affect   Labs High Sensitivity Troponin:   Recent Labs  Lab 07/01/23 1936  TROPONINIHS 30*     Chemistry Recent Labs  Lab 07/06/23 1017 07/07/23 0318 07/08/23 0437 07/11/23 0538 07/12/23 0539 07/13/23 0314  NA 138  135   < > 140 142 146*  K 2.8* 3.2*   < > 2.9* 3.7 3.8  CL 107 106   < > 107 107 110  CO2 25 21*   < > 25 27 29   GLUCOSE 75 70   < > 138* 131* 138*  BUN 19 16   < > 27* 27* 32*  CREATININE 0.83 0.70   < > 1.14* 1.08* 1.05*  CALCIUM  7.4* 7.6*   < > 7.8* 7.9* 8.1*  MG  --  1.7   < > 2.1 2.0 2.2  PROT 4.2*  --   --   --   --   --   ALBUMIN  <1.5* <1.5*  --   --   --   --   AST 23  --   --   --   --   --   ALT 14  --   --   --   --   --   ALKPHOS 122  --   --   --   --   --   BILITOT 0.6  --   --   --   --   --   GFRNONAA >60 >60   < > 51* 55* 56*  ANIONGAP 6 8   < > 8 8 7    < > = values in this interval not displayed.    Lipids No results for input(s): CHOL, TRIG, HDL, LABVLDL, LDLCALC, CHOLHDL in the last 168 hours.  Hematology Recent Labs  Lab  07/11/23 0538 07/12/23 0539 07/13/23 0314  WBC 7.6 9.1 9.0  RBC 3.09* 3.41* 3.35*  HGB 8.3* 9.1* 9.0*  HCT 24.0* 27.8* 27.4*  MCV 77.7* 81.5 81.8  MCH 26.9 26.7 26.9  MCHC 34.6 32.7 32.8  RDW 18.6* 20.1* 19.2*  PLT 412* 470* 518*   Thyroid  No results for input(s): TSH, FREET4 in the last 168 hours.  BNP Recent Labs  Lab 07/11/23 0533  BNP 3,333.6*    DDimer No results for input(s): DDIMER in the last 168 hours.   Radiology  DG Chest Port 1 View Result Date: 07/11/2023 IMPRESSION: 1. Persistent pulmonary vascular congestion with possible mild interstitial edema. 2. Similar dense left basilar opacity, favored to reflect atelectasis. Electronically Signed   By: Harrietta Sherry M.D.   On: 07/11/2023 09:52   Cardiac Studies  07/02/2023 Echo complete 1. Left ventricular ejection fraction, by estimation, is 45 to 50%. The  left ventricle has mildly decreased function. The left ventricle  demonstrates global hypokinesis. The left ventricular internal cavity size  was mildly dilated. There is mild  concentric left ventricular hypertrophy. Left ventricular diastolic  parameters are consistent with Grade I diastolic dysfunction (impaired  relaxation).   2. Right ventricular systolic function is normal. The right ventricular  size is normal.   3. A small pericardial effusion is present. The pericardial effusion is  posterior to the left ventricle.   4. The mitral valve is grossly normal. Trivial mitral valve  regurgitation.   5. The aortic valve is grossly normal. Aortic valve regurgitation is not  visualized.   Patient Profile   73 y.o. 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 difficile, presenting with weakness, UTI, worsening  sacral wound, syncope in the ER Several episodes of narrow complex tachycardia lasting less than 1 hour consistent  with SVT, previously treated with amiodarone   Assessment & Plan   Paroxysmal SVT Paroxysmal atrial fibrillation - Known history of SVT, previously on amiodarone  03/2023 which was stopped as outpatient when prescription ran out - Multiple episodes of SVT noted during this admission, lasting up to 1 hour; overall burden improving - Started on IV amiodarone  bolus and infusion 6/25 - Recommend K > 4 as below - Paroxysms of atrial fibrillation and atrial tachycardia noted on telemetry with rates 110-140 bpm - CHA2DS2-VASc Score = 5  - Continue IV amiodarone  and metoprolol  succinate 12.5 mg daily - Consider addition of digoxin  if rates remain difficult to control - IV heparin  transitioned to Eliquis  5 mg twice daily yesterday  HFmrEF - Echo this admission with EF 45-50% - Started on IV Lasix  40 mg every 8 hours yesterday; agree with continuing this - Remains volume overloaded although there is likely an element of third spacing involved - Continue to monitor kidney function, strict I/Os, and daily weights with ongoing diuresis - Continued on metoprolol  succinate 12.5 mg daily - Hypotension limits escalation of GDMT  Encephalopathy - Mental status improving - In the setting of UTI, sacral decub ulcer, septic picture - EEG with moderate diffuse encephalopathy, MRI brain completed with motion artifact  Sepsis due to UTI - Presenting with tachycardia, tachypnea, leukocytosis, and lactic acidosis - Ongoing management per IM  History of DVT/PE - High risk for recurrence - Continue Eliquis  5 mg twice daily  Hypokalemia - Recommend repletion for K > 4  For questions or updates, please contact  [facility] Please consult [email or web address] for contact info under     Signed, Lesley LITTIE Maffucci, PA-C  07/13/2023, 10:01 AM

## 2023-07-13 NOTE — Plan of Care (Signed)

## 2023-07-13 NOTE — Progress Notes (Signed)
 Nutrition Follow-up  DOCUMENTATION CODES:   Morbid obesity, Non-severe (moderate) malnutrition in context of chronic illness  INTERVENTION:   -RD will follow for diet advancement and add supplements as appropriate -Continue TF via NGT:    Osmolite 1.5 @ 50 ml/hr    60 ml Prosource TF20 daily   30 ml free water  flush every 4 hours   Tube feeding regimen provides 1880 kcal (100% of needs), 95 grams of protein, and 914 ml of H2O.  Total free water : 1094 ml daily   -Continue 500 mg vitamin C  BID -Continue 220 mg zinc  sulfate daily 14 days -Continue MVI with minerals BID -Continue 500 mg calcium  carbonate TID -Continue 100 mg thiamine  daily x 7 days -Monitor Mg, K, and Phos daily and replete as needed secondary to high refeeding risk -Continue 2 mg copper  daily x 30 days -If aggressive care is desired, may need to consider alternatives mean of nutrition/ hydration (ex PEG); recommendations discussed with medical team  NUTRITION DIAGNOSIS:   Moderate Malnutrition related to chronic illness as evidenced by mild fat depletion, moderate fat depletion, mild muscle depletion, moderate muscle depletion.  Ongoing  GOAL:   Patient will meet greater than or equal to 90% of their needs  Met with TF  MONITOR:   TF tolerance  REASON FOR ASSESSMENT:   Consult Wound healing  ASSESSMENT:   Pt with medical history significant for asthma, osteoarthritis, giant cell arthritis, combined systolic and diastolic CHF, COPD, DVT, PE and atrial fibrillation on Eliquis , who presented with acute onset of increased weakness.  6/23- s/p EEG- revealed moderate diffuse encephalopathy typically related to toxic-metabolic causes like cefepime  toxicity, rapid response- transferred to ICU 6/24- LP attempted- no return of CSF fluid 6/26- NGT placed (KUB verified placement in stomach) 6/30- s/p BSE- dysphagia 1 diet with nectar thick liquids  Reviewed I/O's: +3 L x 24 hours and +2.4 L since  admission  UOP: 1.6 L x 24 hours  Colostomy output: 325 ml x 24 hours   Pt remains with NGT for primary nutritional support. ; infusing with Osmolite 1.5 at goal rate of 50 ml/hr. Pt tolerating well.   Case discussed with SLP; pt remains on a dysphagia 1 diet with nectar thick liquids. Pt's oral intake is mainly for comfort only and remains dependent on NGT for primary source of nutrition.   Palliative care consulting pending. Per TOC notes, considering SNF placement. MD reports pt with poor prognosis. If aggressive care is warranted, may need to consider alternative means of nutrition/ hydration. Recommendations discussed with team.   Medications reviewed and include vitamin C , copper , lasix , neurontin , solu-cortef , protonix , thiamine , and zinc  sulfate.   Labs reviewed: Na: 146, CBGS: 127-144 (inpatient orders for glycemic control are none).    Diet Order:   Diet Order     None       EDUCATION NEEDS:   Education needs have been addressed  Skin:  Skin Assessment: Skin Integrity Issues: Skin Integrity Issues:: Stage III Stage III: sacrum  Last BM:  07/12/23 (150 ml via colostomy)  Height:   Ht Readings from Last 1 Encounters:  07/01/23 5' 8 (1.727 m)    Weight:   Wt Readings from Last 1 Encounters:  07/13/23 124 kg    Ideal Body Weight:  63.6 kg  BMI:  Body mass index is 41.57 kg/m.  Estimated Nutritional Needs:   Kcal:  1900-2100  Protein:  95-110 grams  Fluid:  1.9-2.1 L    Margery ORN, RD, LDN,  CDCES Registered Dietitian III Certified Diabetes Care and Education Specialist If unable to reach this RD, please use RD Inpatient group chat on secure chat between hours of 8am-4 pm daily

## 2023-07-13 NOTE — Progress Notes (Signed)
 Patient had a sudden onset of shortness of breath, becomes hypoxic.  Reviewed CXR, most likely had massive aspiration.  Her prognosis is very poor, very little chance of survival.  Decision is made to change to comfort care. Anticipating death in 1 to 4 days.

## 2023-07-13 NOTE — TOC Progression Note (Addendum)
 Transition of Care [facility]) - Progression Note    Patient Details  Name: Wendy Sanchez MRN: [id number] Date of Birth: 10-Jul-1950  Transition of Care [facility]) CM/SW Contact  Dalia GORMAN Fuse, RN Phone Number: 07/13/2023, 10:27 AM  Clinical Narrative:    [name] outreached to the patient's sister [name] Hummer [phone] at the request of the patient. TOC offered SNF choice. [name] shared that she needed to speak with the patient's children about SNF. She also expressed concerns that her sister is not ready for rehab at this point. TOC advised that we want to ensure there is a plan in place for when the patient is medically ready. Wilhemina requested a list of  SNF options.   TOC left the list and CM phone number in the patient's room.  FL2 completed and existing PASRR added [id number]   TOC will continue to follow.    Expected Discharge Plan: Home/Self Care Barriers to Discharge: Continued Medical Work up  Expected Discharge Plan and Services   Discharge Planning Services: CM Consult   Living arrangements for the past 2 months: Single Family Home                             HH Agency[facility]         Social Determinants of Health (SDOH) Interventions SDOH Screenings   Food Insecurity: No Food Insecurity (07/03/2023)  Housing: Low Risk  (07/03/2023)  Transportation Needs: Unmet Transportation Needs (07/03/2023)  Utilities: Not At Risk (07/03/2023)  Depression (PHQ2-9): Low Risk  (07/01/2022)  Financial Resource Strain: Low Risk  (12/26/2020)   [facility]  Social Connections: Unknown (07/03/2023)  Recent Concern: Social Connections - Socially Isolated (07/03/2023)  Tobacco Use: High Risk (07/01/2023)    Readmission Risk Interventions    03/08/2023   11:09 AM 02/03/2021    9:12 AM  Readmission Risk Prevention Plan  Transportation Screening Complete Complete  Medication Review (RN Care Manager) Complete Complete  PCP or  Specialist appointment within 3-5 days of discharge Complete Complete  HRI or Home Care Consult  Complete  SW Recovery Care/Counseling Consult Complete   Palliative Care Screening Not Applicable Not Applicable  Skilled Nursing Facility Complete Complete

## 2023-07-13 NOTE — Progress Notes (Signed)
 Upon checking on patient, found her guppy breathing and minimally responsive, gave her the lasix  and steroid that I was coming in to give her and notified MD, RT, got vitals, bumped o2 up due to patient sating at 86%, ended up calling a rapid to escalate care faster. Notified sister who is on her way. MD aware and all orders carried out, patient started on bipap. Will continue to monitor

## 2023-07-13 NOTE — Consult Note (Signed)
 Consultation Note Date: 07/13/2023 at 1320  Patient Name: Wendy Sanchez  DOB: Jul 29, 1950  MRN: [id number]  Age / Sex: 73 y.o., female  PCP: Pcp, No Referring Physician: Laurita Pillion, MD  HPI/Patient Profile: 73 y.o. 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 difficile, admitted from [facility] on 07/01/2023 with weakness, UTI, worsening sacral wound, and syncope in the ED.  Cardiology consulted.  Echo on this admission reflected EF of 45-50% with grade 1 diastolic dysfunction as well as a small posterior pericardial effusion of the left ventricle.  Given severe AMS on arrival, EEG performed that did not reveal any evidence of seizure.  MRI of brain (poor quality) did not reveal any acute changes.  LP was attempted but CSF fluid could not be collected.  Suspected withdrawal from Neurontin .  6/28 Neurontin  restarted and mental status began to improve.  However, patient then developed significant volume overload.  IV Lasix  started.  Patient is being treated for paroxysmal A-fib, HFmrEF, encephalopathy, sepsis due to UTI (E. coli Serratia and Pseudomonas), and hypokalemia.  PMT was consulted to support patient and family with goals of care discussions given her poor prognosis.  Clinical Assessment and Goals of Care: Extensive chart review completed prior to meeting patient including labs, vital signs, imaging, progress notes, orders, and available advanced directive documents from current and previous encounters. I then met with patient and her brother Wendy Sanchez at bedside to discuss diagnosis prognosis, GOC, EOL wishes,  disposition and options.  I introduced Palliative Medicine as specialized medical care for people living with serious illness. It focuses on providing relief from the symptoms and stress of a serious illness. The goal is to improve quality of life for both the patient and the family.  We discussed a brief life review of the patient.  [name] describes Wavie as a very active person.  He shares she enjoyed gardening, riding motorcycles, and raising grandchildren.  Patient is unable to provide any information given she is too weak to speak.  Symptoms assessed.  Patient was able to vocalize that she is not experiencing any pain at this time.  However, she is minimally interactive and falls asleep often during our discussion.  We discussed patient's current illness and what it means in the larger context of patient's on-going co-morbidities.  Education provided on UTI, artificial tube feedings, treatment and healing of decubitus ulcer and bedbound patients, heart failure, AKI, paroxysmal A-fib with RVR, and overall debility.  Discussed functional, nutritional, and cognitive status is significant indicators of patient's prognosis.  Shared concern that patient has a poor prognosis given her frailty, debility, multiple hospitalizations, bedbound status, and multiple medical problems.  I attempted to elicit values and goals of care important to the patient.  Patient I am able to make her wishes known at this time.  However, Wendy Sanchez shares that patient wants to live.  He shares that she is not interested in dying.  I shared that I do not believe the patient is dying.  However, given her current status, she is showing signs of decline.  When patients begin to show signs of decline and have a poor prognosis, we then discussed boundaries and goals of care important to the patient. Advance directives, concepts specific to code status, artificial feeding and hydration, and rehospitalization were considered and  discussed.  [name] shares that patient has not made her wishes known in the past to him.  However, he is not sure what she has spoken to their sister Wendy Sanchez about.  He shares [facility] plans to be Musician.  Education offered regarding the limitations of medical interventions to prolong life when the body begins to fail to thrive.  Discussed human mortality, the difference between being kept alive and living, shifting from treatment to comfort, and acknowledging that ones earthly body can slow before their mental/spiritual wellbeing is ready.  [name] was appreciative of my visit and the education provided.  He shares that his sister plans to be bedside tomorrow to continue goals of care discussion with PMT.  After meeting with the patient, attempted to speak with patient's sister over the phone.  No answer.  VM has not yet been set up.  Unable to leave a voicemail at this time.  I will attempt to speak with patient's sister at a later date/time.  PMT will continue to follow and support patient and family with goals of care discussions.   Primary Decision Maker PATIENT  Physical Exam Vitals reviewed.  Constitutional:      General: She is not in acute distress.    Appearance: She is obese. She is not ill-appearing.  HENT:     Head: Normocephalic.     Mouth/Throat:     Mouth: Mucous membranes are moist.   Eyes:     Pupils: Pupils are equal, round, and reactive to light.   Pulmonary:     Comments: [place] in place Abdominal:     Palpations: Abdomen is soft.   Skin:    General: Skin is warm and dry.   Neurological:     Mental Status: She is alert.     Comments: Unable to make vocalizations beyond saying yes twice  Psychiatric:        Mood and Affect: Mood normal.        Behavior: Behavior normal.     Palliative Assessment/Data: 30%     Thank you for this consult. Palliative medicine will continue to follow and assist holistically.   Time Total: 75  minutes  Time spent includes: Detailed review of medical records (labs, imaging, vital signs), medically appropriate exam (mental status, respiratory, cardiac, skin), discussed with treatment team, counseling and educating patient, family and staff, documenting clinical information, medication management and coordination of care.  Signed by: Lamarr Gunner, DNP, FNP-BC Palliative Medicine   Please contact Palliative Medicine Team providers via [facility] for questions and concerns.

## 2023-07-13 NOTE — Progress Notes (Signed)
 Progress Note   Patient: Wendy Sanchez FMW:[id number] DOB: 12-16-1950 DOA: 07/01/2023     12 DOS: the patient was seen and examined on 07/13/2023   Brief hospital course: From HPI LEONIE AMACHER is a 73 y.o. African-American female with medical history significant for asthma, osteoarthritis, giant cell arthritis, combined systolic and diastolic CHF, COPD, DVT, PE and atrial fibrillation on Eliquis , who presented to the emergency room with acute onset of increased weakness.  Patient was hypotensive upon arriving to hospital, she also had a syncope due to hypotension. Patient also has tachycardia, tachypnea and leukocytosis.  She also has severe altered mental status, EEG did not show any evidence of seizure.  MRI of the brain poor quality, but did not show any acute changes.  LP was performed, could not collect any CSF. Her urine culture grow E. coli Serratia and Pseudomonas.  Patient was placed on broad-spectrum antibiotics.  Patient mental status improved in the morning of 6/28 after restarting Neurontin .  But the patient developed significant volume overload, started on IV Lasix .   Principal Problem:   Sepsis due to gram-negative UTI (HCC) Active Problems:   Lower urinary tract infection   Syncope   Hypotension   Chronic obstructive pulmonary disease (COPD) (HCC)   Peripheral neuropathy   Protein-calorie malnutrition, moderate (HCC)   Acute on chronic combined systolic and diastolic CHF (congestive heart failure) (HCC)   Paroxysmal atrial fibrillation (HCC)   GERD without esophagitis   Asthma-COPD overlap syndrome (HCC)   Reactive thrombocytosis   Adrenal crisis (HCC)   Weakness   Encephalopathy   Acute metabolic encephalopathy   Decreased cardiac ejection fraction   Paroxysmal SVT (supraventricular tachycardia) (HCC)   Assessment and Plan:  Severe sepsis due to gram-negative UTI (HCC) Gram-negative urinary tract infection. Stage III sacral decubitus ulcer - Sepsis  manifested by leukocytosis, tachycardia and tachypnea.  Patient also had a severe lactic acidosis of 4.5.  Sepsis is mainly caused by urinary tract infection, also compounded by decubitus sacral ulcer Wound care on board.  Culture results showing Pseudomonas in the wound, urine cultures growing E. coli, Serratia as well as Pseudomonas Patient is seen by infectious disease, had been treated with meropenem  and vancomycin .   Discussed with ID, antibiotic changed to ceftazidime on 7/1.   Acute metabolic encephalopathy rule out possible bacterial meningitis or encephalitis Noted to have some tremors involving the right face and legs as well as bilateral upper extremities. EEG obtained that did not show any epileptiform waves LP was attempted by IR today however unsuccessful MRI of the brain with and without contrast showed a moderate motion artifact, but no apparent acute changes. Initially covered for meningitis with vancomycin , meropenem  and acyclovir . Dobbhoff was placed 6/26, tube feeding started. Later, altered mental status was identified due to withdrawal from Neurontin .  Neurontin  restarted on 6/28, mental status improved after that.   Acute on chronic combined systolic and diastolic congestive heart failure.  Patient had echocardiogram performed on 6/20, ejection fraction 45 to 50% with grade 1 diastolic dysfunction. Patient was given 60 mg IV Lasix  6/28.  Repeat BNP over 3000, chest x-ray showed vascular congestion with evidence of pulm edema.  Patient also has anasarca.  Discussed with cardiology, diuretics switched to furosemide  40 mg every 8 hours 6/29. Volume status appears to be improving.  Followed by cardiology now, still on IV furosemide .  Patient developed some hypernatremia.  I have decreased the Lasix  to twice a day, give her a dose of metolazone .  Anemia of chronic disease. Reactive thrombocytopenia. Recent lab test showed slight decrease iron with elevated ferritin, elevated  B12 level.  Condition consistent with anemia of chronic disease.   Hemoglobin relatively stable.  Continue to follow.   Syncope likely secondary to hypotension Adrenal crisis. Hypoglycemia secondary to adrenal crisis Continue neurochecks closely Echo showed EF 45 to 50% with grade 1 diastolic dysfunction Patient not fluid overloaded Patient had cortisol level of 2.5, she also had significant hypotension, hypoglycemia.  She has adrenal crisis.  She was chronically taking lower dose of Cortef , started stress dose hydrocortisone  at 100 mg every 8 hours 6/25. Patient condition started improving, reduced hydrocortisone  to 50 mg every 12 hours.   May consider change to oral cortisol when patient can reliably taking p.o.   Hypokalemia Hyponatremia. Acute kidney injury. Hypernatremia Patient had baseline creatinine around 0.7, increased to 1.14.  Creatinine is better after giving IV Lasix . Developed mild hypernatremia.  Give a dose of metolazone .    Chronic obstructive pulmonary disease (COPD) (HCC) ABG did not show any evidence of CO2 retention. Continue her as needed DuoNebs.   Peripheral neuropathy Restarted Neurontin .   GERD without esophagitis Continue PPI therapy   Paroxysmal atrial fibrillation (HCC) with RVR. Paroxysmal atrial tachycardia. Patient is followed by cardiology, heart rate more stable.  Still on heparin  drip, amiodarone .  Beta-blocker.  Heart rate better controlled. Change heparin  drip to Eliquis .  Still on amiodarone  drip per cardiology. Patient still has intermittent tachycardia with heart rate about 130s, each episode lasted about an hour.  Followed by cardiology.   Class II obesity with BMI 37.78. Complicated patient care as well as prognosis    Pressure ulcer POA  Stage 2 pressure ulcers bilateral ischial tuberosity   Goal of care discussion. Had a long discussion with patient's sister, who has been involved in her care daily.  Patient has multiple medical  problems, she also has severe debility.  Currently, she is too weak even to take a few bites.  Patient had previous made decision that tube feeding will not be longer than 2 weeks.  I do not anticipate a dramatic improvement in 1 to 2 weeks.  Given multiple medical problem above, patient prognosis is definitely less than a year, probably less than 6 months.  Will obtain palliative care consult.  May consider hospice, given comfort care.    Subjective:  Patient still has significant other breath with minimal exertion.  Too weak to cough.  Cannot tolerate eating due to weakness.  Physical Exam: Vitals:   07/13/23 0726 07/13/23 0727 07/13/23 0811 07/13/23 1151  BP: 90/75  90/75 112/76  Pulse: (!) 52 88  99  Resp:      Temp: 97.9 F (36.6 C)   98.5 F (36.9 C)  TempSrc: Oral   Oral  SpO2: 100% 100%  97%  Weight:      Height:       General exam: Appears calm and comfortable, morbid obese. Respiratory system: Decreased breath sounds. Respiratory effort normal. Cardiovascular system: Irregular. No JVD, murmurs, rubs, gallops or clicks. Gastrointestinal system: Abdomen is nondistended, soft and nontender. No organomegaly or masses felt. Normal bowel sounds heard. Central nervous system: Alert and oriented x2. No focal neurological deficits. Extremities: 2+ leg edema. Skin: No rashes, lesions or ulcers Psychiatry: Flat affect   Data Reviewed:  Lab results reviewed.  Family Communication: Sister updated over the phone.  Disposition: Status is: Inpatient Remains inpatient appropriate because: Severity of disease.  IV treatment.  Time spent: 60 minutes  Author: Murvin Mana, MD 07/13/2023 12:24 PM  For on call review [email or web address].

## 2023-07-13 NOTE — Plan of Care (Signed)
  Problem: Fluid Volume: Goal: Hemodynamic stability will improve Outcome: Not Progressing   Problem: Clinical Measurements: Goal: Diagnostic test results will improve Outcome: Not Progressing Goal: Signs and symptoms of infection will decrease Outcome: Not Progressing   Problem: Respiratory: Goal: Ability to maintain adequate ventilation will improve Outcome: Not Progressing   Problem: Education: Goal: Knowledge of General Education information will improve Description: Including pain rating scale, medication(s)/side effects and non-pharmacologic comfort measures Outcome: Not Progressing   Problem: Health Behavior/Discharge Planning: Goal: Ability to manage health-related needs will improve Outcome: Not Progressing   Problem: Clinical Measurements: Goal: Ability to maintain clinical measurements within normal limits will improve Outcome: Not Progressing Goal: Will remain free from infection Outcome: Not Progressing Goal: Diagnostic test results will improve Outcome: Not Progressing Goal: Respiratory complications will improve Outcome: Not Progressing Goal: Cardiovascular complication will be avoided Outcome: Not Progressing   Problem: Activity: Goal: Risk for activity intolerance will decrease Outcome: Not Progressing   Problem: Nutrition: Goal: Adequate nutrition will be maintained Outcome: Not Progressing   Problem: Coping: Goal: Level of anxiety will decrease Outcome: Not Progressing   Problem: Elimination: Goal: Will not experience complications related to bowel motility Outcome: Not Progressing Goal: Will not experience complications related to urinary retention Outcome: Not Progressing   Problem: Pain Managment: Goal: General experience of comfort will improve and/or be controlled Outcome: Not Progressing   Problem: Safety: Goal: Ability to remain free from injury will improve Outcome: Not Progressing   Problem: Skin Integrity: Goal: Risk for  impaired skin integrity will decrease Outcome: Not Progressing

## 2023-07-13 NOTE — Significant Event (Signed)
 Rapid Response Event Note   Reason for Call :  Respiratory distress  Initial Focused Assessment:  Rapid response RN arrived in patient's room with patient in bed surrounded by 2A staff and RTs. Patient with labored breathing. Alert and able to very weakly follow simple commands, though not speak. Lungs very diminished on right and coarse crackles on rhonchi on left. Patient already received lasix  dose this afternoon and steroids. CBG 175. Patient initially had been on 2L nasal cannula and was found to have saturations in upper 80s so was adjusted to 4L nasal cannula, and then RT placed on NRB at 15L. Patient at time of rapid response oxygen saturations 100% on the 15L NRB, BP 128/98, HR 102 appeared SR, RR in upper 20s.   Interventions:  Dr. Jhonny ordered ABG, chest x-ray, and lab work. Post ABG patient titrated down to venturi mask down to 55% FiO2. Oxygen saturations still 100%, no change in work of breathing.  Plan of Care:  Patient's nurse and attending to call family and update. Per Dr. Jhonny, patient placed on Bipap until family arrived for further goals of care discussion.  Event Summary:   MD Notified: Dr. Laurita virtually, Dr. Jhonny at bedside Call Time: 17:31 Arrival Time: 17:32 End Time: 17:47  Dayanna Pryce, LAURAINE NORRIS, RN

## 2023-07-13 DEATH — deceased

## 2023-08-13 NOTE — Progress Notes (Signed)
 Nutrition Brief Note  Chart reviewed. Pt now transitioning to comfort care.  No further nutrition interventions planned at this time.  Please re-consult as needed.   Levada Schilling, RD, LDN, CDCES Registered Dietitian III Certified Diabetes Care and Education Specialist If unable to reach this RD, please use "RD Inpatient" group chat on secure chat between hours of 8am-4 pm daily

## 2023-08-13 NOTE — Plan of Care (Signed)
  Problem: Fluid Volume: Goal: Hemodynamic stability will improve Outcome: Not Progressing   Problem: Clinical Measurements: Goal: Diagnostic test results will improve Outcome: Not Progressing Goal: Signs and symptoms of infection will decrease Outcome: Not Progressing   Problem: Respiratory: Goal: Ability to maintain adequate ventilation will improve Outcome: Not Progressing   Problem: Education: Goal: Knowledge of General Education information will improve Description: Including pain rating scale, medication(s)/side effects and non-pharmacologic comfort measures Outcome: Not Progressing   Problem: Health Behavior/Discharge Planning: Goal: Ability to manage health-related needs will improve Outcome: Not Progressing   Problem: Clinical Measurements: Goal: Ability to maintain clinical measurements within normal limits will improve Outcome: Not Progressing Goal: Will remain free from infection Outcome: Not Progressing Goal: Diagnostic test results will improve Outcome: Not Progressing Goal: Respiratory complications will improve Outcome: Not Progressing Goal: Cardiovascular complication will be avoided Outcome: Not Progressing   Problem: Activity: Goal: Risk for activity intolerance will decrease Outcome: Not Progressing   Problem: Nutrition: Goal: Adequate nutrition will be maintained Outcome: Not Progressing   Problem: Coping: Goal: Level of anxiety will decrease Outcome: Not Progressing   Problem: Elimination: Goal: Will not experience complications related to bowel motility Outcome: Not Progressing Goal: Will not experience complications related to urinary retention Outcome: Not Progressing   Problem: Pain Managment: Goal: General experience of comfort will improve and/or be controlled Outcome: Not Progressing   Problem: Safety: Goal: Ability to remain free from injury will improve Outcome: Not Progressing   Problem: Skin Integrity: Goal: Risk for  impaired skin integrity will decrease Outcome: Not Progressing   Problem: Education: Goal: Knowledge of the prescribed therapeutic regimen will improve Outcome: Not Progressing   Problem: Coping: Goal: Ability to identify and develop effective coping behavior will improve Outcome: Not Progressing   Problem: Clinical Measurements: Goal: Quality of life will improve Outcome: Not Progressing   Problem: Respiratory: Goal: Verbalizations of increased ease of respirations will increase Outcome: Not Progressing   Problem: Role Relationship: Goal: Family's ability to cope with current situation will improve Outcome: Not Progressing Goal: Ability to verbalize concerns, feelings, and thoughts to partner or family member will improve Outcome: Not Progressing   Problem: Pain Management: Goal: Satisfaction with pain management regimen will improve Outcome: Not Progressing

## 2023-08-13 NOTE — Death Summary Note (Signed)
 DEATH SUMMARY   Patient Details  Name: Wendy Sanchez MRN: [id number] DOB: October 24, 1950 PCP:Pcp, No Admission/Discharge Information   Admit Date:  07/16/23  Date of Death: Date of Death: July 29, 2023  Time of Death: Time of Death: 0545  Length of Stay: 08/09/2023   Principle Cause of death: Aspiration Pneumonia Sepsis due to UTI Metabolic encephalopathy  Hospital Diagnoses: Principal Problem:   Sepsis due to gram-negative UTI [facility]) Active Problems:   Lower urinary tract infection   Syncope   Hypotension   Chronic obstructive pulmonary disease (COPD) (HCC)   Peripheral neuropathy   Protein-calorie malnutrition, moderate (HCC)   Acute on chronic combined systolic and diastolic CHF (congestive heart failure) (HCC)   Paroxysmal atrial fibrillation (HCC)   GERD without esophagitis   Asthma-COPD overlap syndrome (HCC)   Reactive thrombocytosis   Adrenal crisis (HCC)   Weakness   Encephalopathy   Acute metabolic encephalopathy   Decreased cardiac ejection fraction   Paroxysmal SVT (supraventricular tachycardia) (HCC)  From HPI Wendy Sanchez is a 73 y.o. African-American female with medical history significant for asthma, osteoarthritis, giant cell arthritis, combined systolic and diastolic CHF, COPD, DVT, PE and atrial fibrillation on Eliquis , who presented to the emergency room with acute onset of increased weakness.   I have not seen the patient during the hospital stay. Pateint was being taken care by Dr Laurita  Patient was admitted to the hospital was started on antibiotics for sepsis due to UTI she received IV fluids had metabolic encephalopathy and was initially covered with multiple antibiotics for rule out meningitis or other medical problems were acute on chronic combined systolic/diastolic congestive heart failure, anemia of chronic disease, hypertension, possible adrenal crisis and electrolyte abnormality. Patient had a complicated 12 day hospital course. She went into  respiratory distress rapid response was called. Chest x-ray showed worsening infiltrate suspected of aspiration. Attending discussed with patient's son and she was made full comfort care. She passed away at 5:45 AM on 2023/07/29.       The results of significant diagnostics from this hospitalization (including imaging, microbiology, ancillary and laboratory) are listed below for reference.   Significant Diagnostic Studies: DG Chest Port 1 View Result Date: 07/13/2023 CLINICAL DATA:  Shortness of breath EXAM: PORTABLE CHEST 1 VIEW COMPARISON:  07/11/2023 FINDINGS: Patient rotated to the right. Feeding tube extends beyond the inferior aspect of the film. Apical lordotic positioning. Moderate to marked cardiomegaly. Atherosclerosis in the transverse aorta. Small left pleural effusion is similar. There may be a tiny right pleural effusion. No pneumothorax. Congestive heart failure is increased, marked. Concurrent perihilar alveolar opacities are also most likely related to the edema. Bibasilar airspace disease. Similar on the left and worsened on the right. IMPRESSION: Worsened aeration with increased congestive heart failure, persistent left and possible developing small right pleural effusion. Left greater than right base airspace disease which may represent atelectasis or concurrent infection/aspiration. Progressive on the right. Aortic Atherosclerosis (ICD10-I70.0). Electronically Signed   By: Rockey Kilts M.D.   On: 07/13/2023 18:20   DG Chest Port 1 View Result Date: 07/11/2023 CLINICAL DATA:  Acute on chronic CHF. EXAM: PORTABLE CHEST 1 VIEW COMPARISON:  07/06/2023. FINDINGS: Stable cardiomegaly. Aortic atherosclerosis. Persistent dense left basilar opacity, favored to reflect atelectasis. Pulmonary vascular congestion with possible mild interstitial edema. No pneumothorax. No acute osseous abnormality. IMPRESSION: 1. Persistent pulmonary vascular congestion with possible mild interstitial edema. 2.  Similar dense left basilar opacity, favored to reflect atelectasis. Electronically Signed   By:  Harrietta Sherry M.D.   On: 07/11/2023 09:52   EEG adult Result Date: 07/09/2023 Shelton Arlin KIDD, MD     07/09/2023  6:50 AM Patient Name: Wendy Sanchez MRN: [id number] Epilepsy Attending: Arlin KIDD Shelton Referring Physician/Provider: Merrianne Locus, MD Date: 07/08/2023 Duration: 27.48 mins Patient history:  73 y.o. female admitted for sepsis and encephalopathy with new onset twitching, complicated by adrenal crisis and tachycardia. Her encephalopathy initially progressively worsened, but seems to no longer be worsening as of today (Thursday). EEG to evaluate for seizure. Level of alertness: Awake/ lethargic AEDs during EEG study: LEV Technical aspects: This EEG study was done with scalp electrodes positioned according to the 10-20 International system of electrode placement. Electrical activity was reviewed with band pass filter of 1-70Hz , sensitivity of 7 uV/mm, display speed of 80mm/sec with a 60Hz  notched filter applied as appropriate. EEG data were recorded continuously and digitally stored.  Video monitoring was available and reviewed as appropriate. Description: EEG showed continuous generalized 3 to 6 Hz theta-delta slowing, at times with triphasic morphology. Hyperventilation and photic stimulation were not performed.   ABNORMALITY - Continuous slow, generalized IMPRESSION: This study is suggestive of moderate diffuse encephalopathy. No seizures or epileptiform discharges were seen throughout the recording. Arlin KIDD Shelton   DG Abd Portable 1V Result Date: 07/08/2023 CLINICAL DATA:  NG tube placement. EXAM: PORTABLE ABDOMEN - 1 VIEW COMPARISON:  None Available. FINDINGS: Tip of the weighted enteric tube just to the right of midline in the region of the distal stomach or proximal duodenum. Multiple surgical clips in the upper abdomen. Mild gaseous distention of bowel loops in the central abdomen.  IMPRESSION: Tip of the weighted enteric tube just to the right of midline in the region of the distal stomach or proximal duodenum. Electronically Signed   By: Andrea Gasman M.D.   On: 07/08/2023 12:23   MR BRAIN W WO CONTRAST Result Date: 07/06/2023 CLINICAL DATA:  Meningitis/CNS infection suspected EXAM: MRI HEAD WITHOUT AND WITH CONTRAST TECHNIQUE: Multiplanar, multiecho pulse sequences of the brain and surrounding structures were obtained without and with intravenous contrast. CONTRAST:  10mL GADAVIST  GADOBUTROL  1 MMOL/ML IV SOLN COMPARISON:  CT head from earlier today. FINDINGS: Moderate to severely motion limited evaluation. Within this limitation: Brain: No obvious acute infarction, hemorrhage, hydrocephalus, extra-axial collection or mass lesion. No pathologic enhancement. Vascular: Major arterial flow voids are maintained at the skull base. Skull and upper cervical spine: Normal marrow signal. Sinuses/Orbits: No obvious acute abnormality. Other: No mastoid effusions. IMPRESSION: Moderate to severely motion limited study without evidence of obvious acute abnormality. Electronically Signed   By: Gilmore GORMAN Molt M.D.   On: 07/06/2023 17:18   DG FL GUIDED LUMBAR PUNCTURE Result Date: 07/06/2023 CLINICAL DATA:  73 year old female who presented to the ED with weakness and concern for possible UTI on 6/19. Patient has gradually become more altered throughout her hospital stay. EEGs do not show any epileptiform waves, but are consistent with encephalopathy. EXAM: LUMBAR PUNCTURE UNDER FLUOROSCOPY PROCEDURE: An appropriate skin entry site was determined fluoroscopically. Operator donned sterile gloves and mask. Skin site was marked, then prepped with Betadine, draped in usual sterile fashion, and infiltrated locally with 1% lidocaine . A 7 inch 22 gauge spinal needle advanced into the thecal sac at L5-S1 from a left interlaminar approach. Imaging was reassuring of appropriate needle positioning, but there  was no CSF return. A second attempt was then made at the level of L2-3 from a left interlaminar approach. A small amount of  CSF return was noted in the hub when the patient coughed, but quickly disappeared. The needle was rotated to facilitate fluid return, but unfortunately no CSF fluid was able to be obtained. The needle was then removed. The patient tolerated the procedure well and there were no complications. FLUOROSCOPY: Radiation Exposure Index (as provided by the fluoroscopic device): 39.6 mGy Kerma IMPRESSION: Technically successful lumbar puncture under fluoroscopy. This exam was performed by Sherrilee Bal, PA-C, and was supervised and interpreted by Dr. KANDICE Banner. Electronically Signed   By: Cordella Banner   On: 07/06/2023 13:19   CT HEAD WO CONTRAST ( ) Result Date: 07/06/2023 CLINICAL DATA:  Initial evaluation for acute meningitis/CNS infection suspected. EXAM: CT HEAD WITHOUT CONTRAST TECHNIQUE: Contiguous axial images were obtained from the base of the skull through the vertex without intravenous contrast. RADIATION DOSE REDUCTION: This exam was performed according to the departmental dose-optimization program which includes automated exposure control, adjustment of the mA and/or kV according to patient size and/or use of iterative reconstruction technique. COMPARISON:  Prior CT from 07/01/2023. FINDINGS: Brain: Cerebral volume within normal limits. Mild chronic microvascular ischemic disease involving the supratentorial cerebral Weaber matter noted. No acute intracranial hemorrhage. No acute large vessel territory infarct. No mass lesion or midline shift. Ventricles normal size without hydrocephalus. No extra-axial fluid collection. Vascular: No abnormal hyperdense vessel. Calcified atherosclerosis of positive up the skull base. Skull: Scalp soft tissues within normal limits.  Calvarium intact. Sinuses/Orbits: Globes and orbital soft tissues within normal limits. Paranasal sinuses and mastoid  air cells are largely clear. Other: None. IMPRESSION: 1. No acute intracranial abnormality. 2. Mild chronic microvascular ischemic disease. Electronically Signed   By: Morene Hoard M.D.   On: 07/06/2023 04:09   DG Chest Port 1 View Result Date: 07/06/2023 CLINICAL DATA:  Possible sepsis EXAM: PORTABLE CHEST 1 VIEW COMPARISON:  03/17/2023 FINDINGS: Cardiac shadow is enlarged but stable. Aortic calcifications are seen. Mild central vascular congestion is noted. Left basilar density is noted consistent with atelectasis. No bony abnormality is noted. IMPRESSION: Left basilar atelectasis. Mild vascular congestion without edema. Electronically Signed   By: Oneil Devonshire M.D.   On: 07/06/2023 00:21   EEG adult Result Date: 07/05/2023 Shelton Arlin KIDD, MD     07/05/2023  3:30 PM Patient Name: Wendy Sanchez MRN: [id number] Epilepsy Attending: Arlin KIDD Shelton Referring Physician/Provider: Dorinda Drue ONEIDA, MD Date: 07/05/2023 Duration: 24.36 mins Patient history: 73yo f with ams. EEG to evaluate for seizure Level of alertness: Awake AEDs during EEG study: None Technical aspects: This EEG study was done with scalp electrodes positioned according to the 10-20 International system of electrode placement. Electrical activity was reviewed with band pass filter of 1-70Hz , sensitivity of 7 uV/mm, display speed of 34mm/sec with a 60Hz  notched filter applied as appropriate. EEG data were recorded continuously and digitally stored.  Video monitoring was available and reviewed as appropriate. Description: EEG showed continuous generalized polymorphic 3 to 6 Hz theta-delta slowing. Generalized periodic discharges with triphasic morphology at  0.5-1 Hz were also noted. Patient was noted to have subtle whole body twitch at 1339.  Concomitant EEG before, during and after the event did not show any EEG changes to suggest seizure.  Hyperventilation and photic stimulation were not performed.   ABNORMALITY - Periodic discharges with  triphasic morphology, generalized ( GPDs) - Continuous slow, generalized IMPRESSION: This study is suggestive of moderate diffuse encephalopathy typically related to toxic-metabolic causes like cefepime  toxicity. No seizures or epileptiform discharges were seen throughout  the recording. Patient was noted to have subtle whole body twitch at 1339 without concomitant EEG change. This was a NON epileptic event. Arlin MALVA Krebs   EEG adult Result Date: 07/05/2023 Krebs Arlin MALVA, MD     07/05/2023  8:19 AM Patient Name: Wendy Sanchez MRN: [id number] Epilepsy Attending: Arlin MALVA Krebs Referring Physician/Provider: Lawence Madison LABOR, MD Date: 07/02/2023 Duration: 21.28 mins Patient history: 73yo F with syncope. EEG to evaluate for seizure Level of alertness: Awake AEDs during EEG study: None Technical aspects: This EEG study was done with scalp electrodes positioned according to the 10-20 International system of electrode placement. Electrical activity was reviewed with band pass filter of 1-70Hz , sensitivity of 7 uV/mm, display speed of 71mm/sec with a 60Hz  notched filter applied as appropriate. EEG data were recorded continuously and digitally stored.  Video monitoring was available and reviewed as appropriate. Description: The posterior dominant rhythm consists of 8-9 Hz activity of moderate voltage (25-35 uV) seen predominantly in posterior head regions, symmetric and reactive to eye opening and eye closing. EEG showed intermittent generalized 3 to 6 Hz theta-delta slowing. Hyperventilation and photic stimulation were not performed.   ABNORMALITY - Intermittent slow, generalized IMPRESSION: This study is suggestive of mild diffuse encephalopathy. No seizures or epileptiform discharges were seen throughout the recording. Arlin MALVA Krebs   ECHOCARDIOGRAM COMPLETE Result Date: 07/02/2023    ECHOCARDIOGRAM REPORT   Patient Name:   Wendy Sanchez Date of Exam: 07/02/2023 Medical Rec #:  [id number]       Height:        68.0 in Accession #:    [id number]      Weight:       274.3 lb Date of Birth:  May 13, 1950       BSA:          2.337 m Patient Age:    73 years        BP:           99/49 mmHg Patient Gender: F               HR:           110 bpm. Exam Location:  [facility] Procedure: 2D Echo, Cardiac Doppler and Color Doppler (Both Spectral and Color            Flow Doppler were utilized during procedure). Indications:     Syncope R55  History:         Patient has prior history of Echocardiogram examinations, most                  recent 03/09/2023. CHF, COPD; Risk Factors:Hypertension.  Sonographer:     Christopher Furnace Referring Phys:  [id number] JAN A MANSY Diagnosing Phys: Dwayne D Callwood MD IMPRESSIONS  1. Left ventricular ejection fraction, by estimation, is 45 to 50%. The left ventricle has mildly decreased function. The left ventricle demonstrates global hypokinesis. The left ventricular internal cavity size was mildly dilated. There is mild concentric left ventricular hypertrophy. Left ventricular diastolic parameters are consistent with Grade I diastolic dysfunction (impaired relaxation).  2. Right ventricular systolic function is normal. The right ventricular size is normal.  3. A small pericardial effusion is present. The pericardial effusion is posterior to the left ventricle.  4. The mitral valve is grossly normal. Trivial mitral valve regurgitation.  5. The aortic valve is grossly normal. Aortic valve regurgitation is not visualized. FINDINGS  Left Ventricle: Left ventricular ejection fraction, by estimation, is 45 to  50%. The left ventricle has mildly decreased function. The left ventricle demonstrates global hypokinesis. Strain was performed and the global longitudinal strain is indeterminate. Global longitudinal strain performed but not reported based on interpreter judgement due to suboptimal tracking. The left ventricular internal cavity size was mildly dilated. There is mild concentric left ventricular hypertrophy. Left  ventricular diastolic parameters are consistent with Grade I diastolic dysfunction (impaired relaxation). Right Ventricle: The right ventricular size is normal. No increase in right ventricular wall thickness. Right ventricular systolic function is normal. Left Atrium: Left atrial size was normal in size. Right Atrium: Right atrial size was normal in size. Pericardium: A small pericardial effusion is present. The pericardial effusion is posterior to the left ventricle. Mitral Valve: The mitral valve is grossly normal. Trivial mitral valve regurgitation. Tricuspid Valve: The tricuspid valve is normal in structure. Tricuspid valve regurgitation is trivial. Aortic Valve: The aortic valve is grossly normal. Aortic valve regurgitation is not visualized. Aortic valve mean gradient measures 13.5 mmHg. Aortic valve peak gradient measures 20.3 mmHg. Aortic valve area, by VTI measures 2.29 cm. Pulmonic Valve: The pulmonic valve was normal in structure. Pulmonic valve regurgitation is not visualized. Aorta: The ascending aorta was not well visualized. IAS/Shunts: No atrial level shunt detected by color flow Doppler. Additional Comments: 3D was performed not requiring image post processing on an independent workstation and was indeterminate.  LEFT VENTRICLE PLAX 2D LVIDd:         5.20 cm   Diastology LVIDs:         3.20 cm   LV e' medial:    5.66 cm/s LV PW:         1.40 cm   LV E/e' medial:  11.9 LV IVS:        1.20 cm   LV e' lateral:   7.72 cm/s LVOT diam:     2.30 cm   LV E/e' lateral: 8.7 LV SV:         81 LV SV Index:   35 LVOT Area:     4.15 cm  RIGHT VENTRICLE RV Basal diam:  4.30 cm RV Mid diam:    2.80 cm RV S prime:     35.10 cm/s TAPSE (M-mode): 3.1 cm LEFT ATRIUM             Index        RIGHT ATRIUM           Index LA diam:        2.10 cm 0.90 cm/m   RA Area:     18.80 cm LA Vol (A2C):   28.6 ml 12.24 ml/m  RA Volume:   56.10 ml  24.00 ml/m LA Vol (A4C):   37.4 ml 16.00 ml/m LA Biplane Vol: 34.8 ml 14.89  ml/m  AORTIC VALVE AV Area (Vmax):    1.88 cm AV Area (Vmean):   1.82 cm AV Area (VTI):     2.29 cm AV Vmax:           225.50 cm/s AV Vmean:          176.500 cm/s AV VTI:            0.356 m AV Peak Grad:      20.3 mmHg AV Mean Grad:      13.5 mmHg LVOT Vmax:         102.00 cm/s LVOT Vmean:        77.300 cm/s LVOT VTI:  0.196 m LVOT/AV VTI ratio: 0.55  AORTA Ao Root diam: 3.70 cm MITRAL VALVE                TRICUSPID VALVE MV Area (PHT): 4.74 cm     TR Peak grad:   33.4 mmHg MV Decel Time: 160 msec     TR Vmax:        289.00 cm/s MV E velocity: 67.10 cm/s MV A velocity: 108.00 cm/s  SHUNTS MV E/A ratio:  0.62         Systemic VTI:  0.20 m                             Systemic Diam: 2.30 cm Cara JONETTA Lovelace MD Electronically signed by Cara JONETTA Lovelace MD Signature Date/Time: 07/02/2023/6:25:24 PM    Final    US  Carotid Bilateral Result Date: 07/02/2023 CLINICAL DATA:  73 year old female with headache and seizure like activity. EXAM: BILATERAL CAROTID DUPLEX ULTRASOUND TECHNIQUE: Elnor scale imaging, color Doppler and duplex ultrasound were performed of bilateral carotid and vertebral arteries in the neck. COMPARISON:  Head CT yesterday.  Neck MRI 11/21/2018. FINDINGS: Criteria: Quantification of carotid stenosis is based on velocity parameters that correlate the residual internal carotid diameter with NASCET-based stenosis levels, using the diameter of the distal internal carotid lumen as the denominator for stenosis measurement. The following velocity measurements were obtained: RIGHT ICA: 35 cm/sec CCA: 60 cm/sec SYSTOLIC ICA/CCA RATIO:  0.8 ECA: 72 cm/sec LEFT ICA: 77 cm/sec CCA: 45 cm/sec SYSTOLIC ICA/CCA RATIO:  1.7 ECA: 70 cm/sec RIGHT CAROTID ARTERY: Mild atherosclerosis at the right carotid bifurcation. RIGHT VERTEBRAL ARTERY:  Stable with antegrade flow detected. LEFT CAROTID ARTERY: Mild atherosclerosis at the left carotid bifurcation. LEFT VERTEBRAL ARTERY:  Patent with antegrade flow  detected. IMPRESSION: Doppler Ultrasound indicates no hemodynamically significant carotid stenosis. Electronically Signed   By: VEAR Hurst M.D.   On: 07/02/2023 07:01   CT Head Wo Contrast Result Date: 07/01/2023 CLINICAL DATA:  Possible UTI, presenting with hypotension. EXAM: CT HEAD WITHOUT CONTRAST TECHNIQUE: Contiguous axial images were obtained from the base of the skull through the vertex without intravenous contrast. RADIATION DOSE REDUCTION: This exam was performed according to the departmental dose-optimization program which includes automated exposure control, adjustment of the mA and/or kV according to patient size and/or use of iterative reconstruction technique. COMPARISON:  August 01, 2018 FINDINGS: Brain: There is generalized cerebral atrophy with widening of the extra-axial spaces and ventricular dilatation. There are areas of decreased attenuation within the Lince matter tracts of the supratentorial brain, consistent with microvascular disease changes. A partially empty sella is seen. Vascular: Marked severity bilateral cavernous carotid artery calcification is noted. Skull: Normal. Negative for fracture or focal lesion. Sinuses/Orbits: No acute finding. Other: None. IMPRESSION: 1. Generalized cerebral atrophy and microvascular disease changes of the supratentorial brain. 2. No acute intracranial abnormality. Electronically Signed   By: Suzen Dials M.D.   On: 07/01/2023 19:46    Microbiology: Recent Results (from the past 240 hours)  Culture, blood (x 2)     Status: None   Collection Time: 07/06/23  2:46 AM   Specimen: BLOOD  Result Value Ref Range Status   Specimen Description BLOOD BLOOD LEFT ARM  Final   Special Requests   Final    BOTTLES DRAWN AEROBIC AND ANAEROBIC Blood Culture adequate volume   Culture   Final    NO GROWTH 5 DAYS Performed at [facility], [address], [place], [place] [address]    Report Status 07/11/2023 FINAL  Final  Culture, blood (x 2)      Status: None   Collection Time: 07/06/23  2:46 AM   Specimen: BLOOD  Result Value Ref Range Status   Specimen Description BLOOD BLOOD LEFT HAND  Final   Special Requests   Final    BOTTLES DRAWN AEROBIC AND ANAEROBIC Blood Culture adequate volume   Culture   Final    NO GROWTH 5 DAYS Performed at [facility], [address], [place], [place] [address]    Report Status 07/11/2023 FINAL  Final  MRSA Next Gen by PCR, Nasal     Status: None   Collection Time: 07/08/23 11:52 PM   Specimen: Nasal Mucosa; Nasal Swab  Result Value Ref Range Status   MRSA by PCR Next Gen NOT DETECTED NOT DETECTED Final    Comment: (NOTE) The GeneXpert MRSA Assay (FDA approved for NASAL specimens only), is one component of a comprehensive MRSA colonization surveillance program. It is not intended to diagnose MRSA infection nor to guide or monitor treatment for MRSA infections. Test performance is not FDA approved in patients less than 57 years old. Performed at [facility], [address] [place], [place] [address]     Time spent: 20 minutes  Signed: Leita Blanch, MD 08/12/2023

## 2023-08-13 NOTE — Progress Notes (Signed)
   10-Aug-2023 0545  Attending Physican Contact  Attending Physician Notified Y  Attending Physician (First and Last Name) Leita Blanch, MD (Notified Dr. Madison Peaches (night on call) of pt passing)  Post Mortem Checklist  Date of Death Aug 10, 2023  Time of Death 0545  Pronounced By Yaakov Collet, RN and Dickey Erp, RN  Next of kin notified Yes  Name of next of kin notified of death [name] [employer]mer and [employer]  Contact Person's Relationship to Patient Sister;Son  Pension scheme manager Phone Number [phone] (Sister) [phone] [facility])  Contact Person's address [address], [place], [place] [address]  Family Communication Notes Son at bedside at time of pt passing  Was the patient a No Code Blue or a Limited Code Blue? Yes  Did the patient die unattended? No  Patient restrained (physical/manual hold/chemical)? *See row information* Not applicable  [facility] (previously known as [facility])  Notification Date 08-10-2023  Notification Time 0628  [facility] Number [id number] [facility])  Is patient a potential donor? Y  Donation Type Eyes;Tissue  Autopsy  Autopsy requested by MD or Family ( Non ME Case) N/A  Patient and Hospital Property Returned  Patient is satisfied that all belongings have been returned? Yes  Name of person receiving valuables? Janavia Rottman (son)  Specify valuables returned comb  Dead on Arrival (Emergency Department)  Patient dead on arrival? No  Medical Examiner  Is this a medical examiner's case? [facility] home name/address/phone # [facility] - [address] [place] [phone]  Planned location of pickup [place]   Sons at bedside at time of passing. AA and on-call MD (Dr. Madison Peaches) notified of pt passing.

## 2023-08-13 DEATH — deceased
